# Patient Record
Sex: Male | Born: 1935
Health system: Southern US, Community
[De-identification: ages and names within clinical notes are randomized; demographics above are authoritative.]

## PROBLEM LIST (undated history)

## (undated) ENCOUNTER — Emergency Department (HOSPITAL_COMMUNITY): Payer: PPO

## (undated) DIAGNOSIS — N400 Enlarged prostate without lower urinary tract symptoms: Secondary | ICD-10-CM

## (undated) DIAGNOSIS — F0280 Dementia in other diseases classified elsewhere without behavioral disturbance: Secondary | ICD-10-CM

## (undated) DIAGNOSIS — R296 Repeated falls: Secondary | ICD-10-CM

## (undated) DIAGNOSIS — I2510 Atherosclerotic heart disease of native coronary artery without angina pectoris: Secondary | ICD-10-CM

## (undated) DIAGNOSIS — N1832 Chronic kidney disease, stage 3b: Secondary | ICD-10-CM

## (undated) DIAGNOSIS — I219 Acute myocardial infarction, unspecified: Secondary | ICD-10-CM

## (undated) DIAGNOSIS — T7840XA Allergy, unspecified, initial encounter: Secondary | ICD-10-CM

## (undated) DIAGNOSIS — D721 Eosinophilia, unspecified: Secondary | ICD-10-CM

## (undated) DIAGNOSIS — I10 Essential (primary) hypertension: Secondary | ICD-10-CM

## (undated) DIAGNOSIS — E7800 Pure hypercholesterolemia, unspecified: Secondary | ICD-10-CM

## (undated) DIAGNOSIS — G500 Trigeminal neuralgia: Secondary | ICD-10-CM

## (undated) DIAGNOSIS — E785 Hyperlipidemia, unspecified: Secondary | ICD-10-CM

## (undated) DIAGNOSIS — N179 Acute kidney failure, unspecified: Secondary | ICD-10-CM

## (undated) DIAGNOSIS — D62 Acute posthemorrhagic anemia: Secondary | ICD-10-CM

## (undated) DIAGNOSIS — R7303 Prediabetes: Secondary | ICD-10-CM

## (undated) DIAGNOSIS — J4540 Moderate persistent asthma, uncomplicated: Secondary | ICD-10-CM

## (undated) DIAGNOSIS — E119 Type 2 diabetes mellitus without complications: Secondary | ICD-10-CM

## (undated) DIAGNOSIS — T07XXXA Unspecified multiple injuries, initial encounter: Secondary | ICD-10-CM

## (undated) DIAGNOSIS — D696 Thrombocytopenia, unspecified: Secondary | ICD-10-CM

## (undated) HISTORY — PX: APPENDECTOMY: SHX54

## (undated) HISTORY — DX: Eosinophilia, unspecified: D72.10

## (undated) HISTORY — DX: Acute kidney failure, unspecified: N17.9

## (undated) HISTORY — DX: Moderate persistent asthma, uncomplicated: J45.40

## (undated) HISTORY — DX: Repeated falls: R29.6

## (undated) HISTORY — DX: Atherosclerotic heart disease of native coronary artery without angina pectoris: I25.10

## (undated) HISTORY — DX: Benign prostatic hyperplasia without lower urinary tract symptoms: N40.0

## (undated) HISTORY — DX: Essential (primary) hypertension: I10

## (undated) HISTORY — PX: EXPLORATORY LAPAROTOMY: SUR591

## (undated) HISTORY — DX: Hyperlipidemia, unspecified: E78.5

## (undated) HISTORY — DX: Unspecified multiple injuries, initial encounter: T07.XXXA

## (undated) HISTORY — DX: Prediabetes: R73.03

## (undated) HISTORY — DX: Acute posthemorrhagic anemia: D62

## (undated) HISTORY — DX: Thrombocytopenia, unspecified: D69.6

## (undated) HISTORY — DX: Allergy, unspecified, initial encounter: T78.40XA

## (undated) HISTORY — DX: Eosinophilia: D72.1

## (undated) HISTORY — DX: Chronic kidney disease, stage 3b: N18.32

## (undated) HISTORY — PX: CARDIAC CATHETERIZATION: SHX172

## (undated) HISTORY — DX: Acute myocardial infarction, unspecified: I21.9

## (undated) HISTORY — DX: Type 2 diabetes mellitus without complications: E11.9

---

## 2006-07-29 ENCOUNTER — Ambulatory Visit: Payer: Self-pay | Admitting: Cardiology

## 2006-07-29 IMAGING — CT CT ANGIO CHEST
2 of 5 series · 19 of 36 positions shown · IV contrast (APPLIED)
Comparison: Chest radiograph obtained earlier today.
COMPARISON: None.

CLINICAL DATA: Acute chest pain radiating to the back. Shortness of breath.

CT ANGIOGRAPHY OF CHEST - PULMONARY EMBOLISM PROTOCOL
TECHNIQUE: Multidetector CT imaging of the chest and abdomen was performed
according to the protocol for detection of aortic dissection during bolus
injection of intravenous contrast.  Coronal and sagittal plane CT angiographic
image reconstructions were also generated.
Contrast:  100 cc Omnipaque 300

[Series 5: dissection 2.0 st · axial · 0.68mm/px · z∈[-499,-51]mm · 16 of 248 slices shown]
[im 12/248  lung]
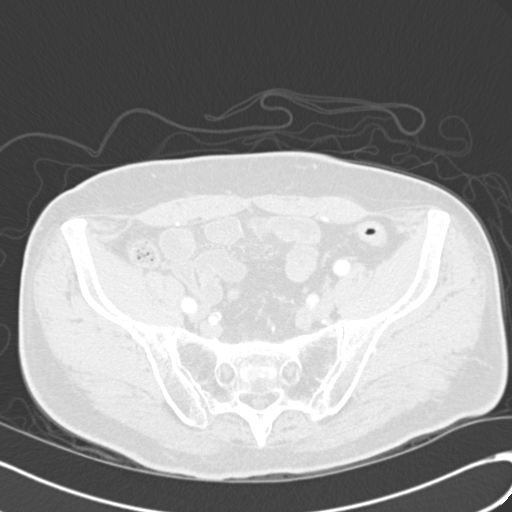
[im 24/248  mediastinal]
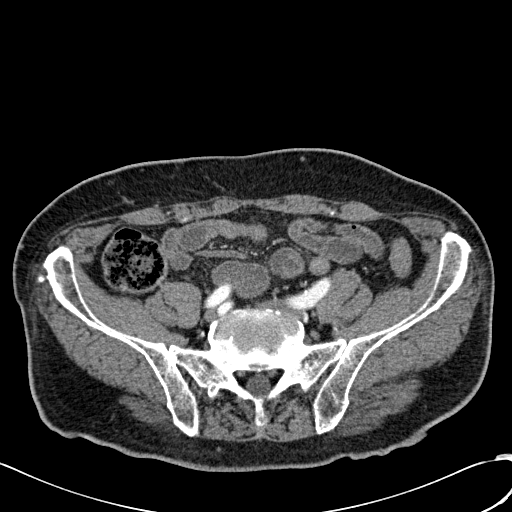
[im 48/248  lung]
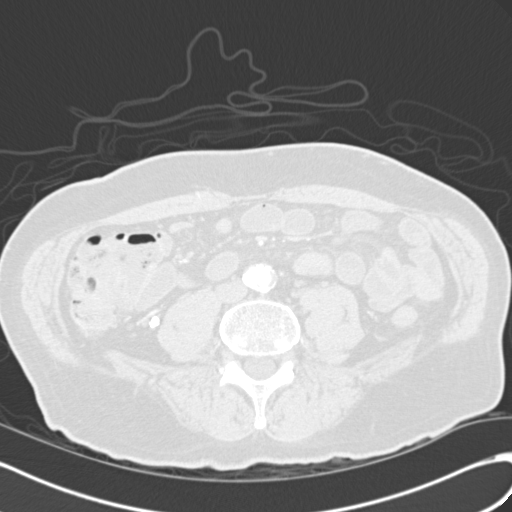
[im 59/248  mediastinal]
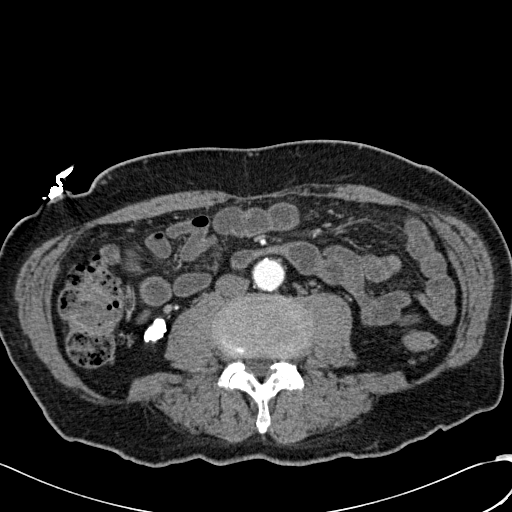
[im 71/248  lung]
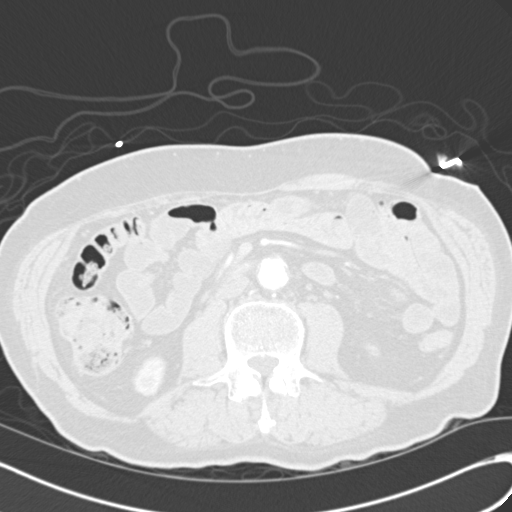
[im 83/248  mediastinal]
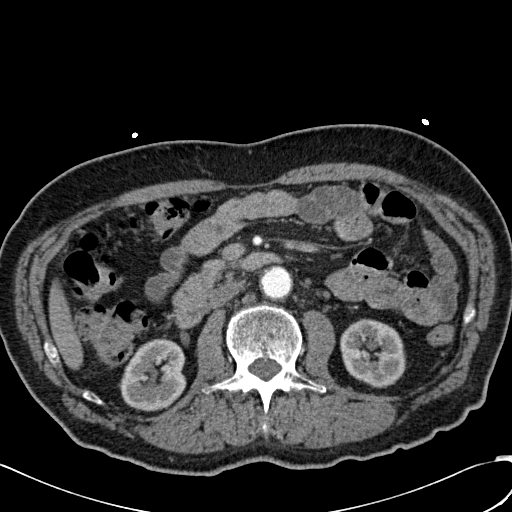
[im 106/248  lung]
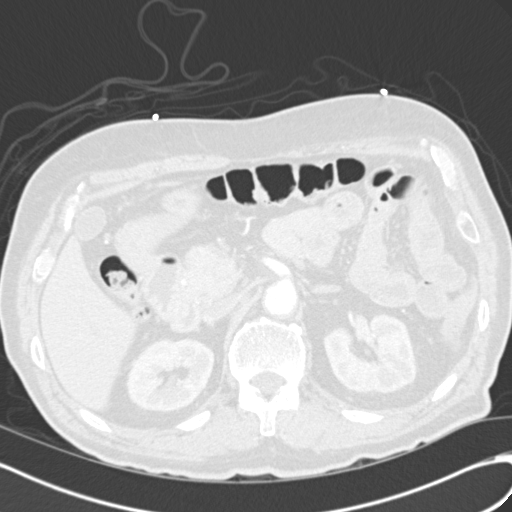
[im 118/248  mediastinal]
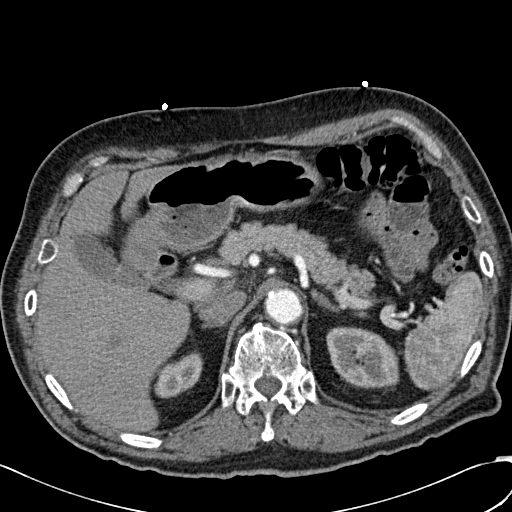
[im 130/248  lung]
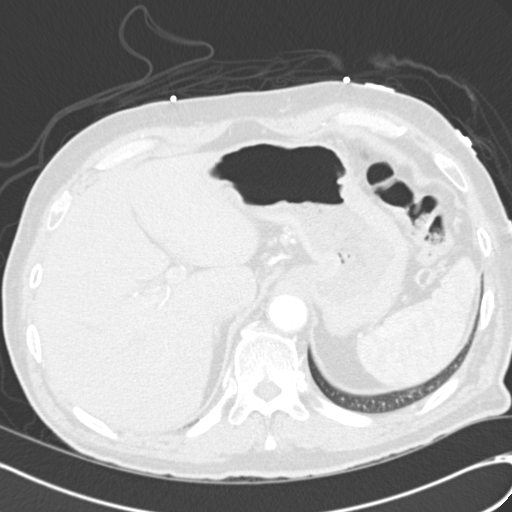
[im 142/248  mediastinal]
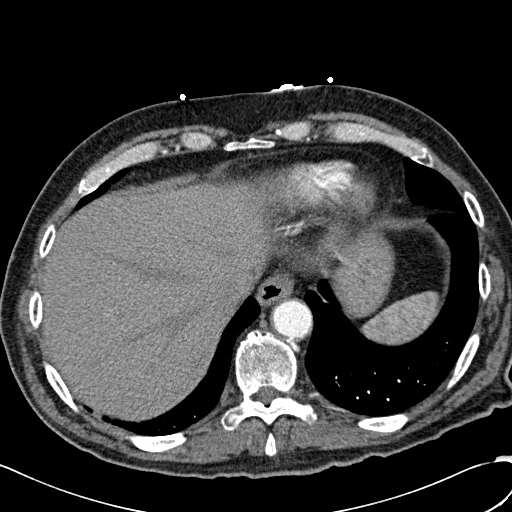
[im 165/248  lung]
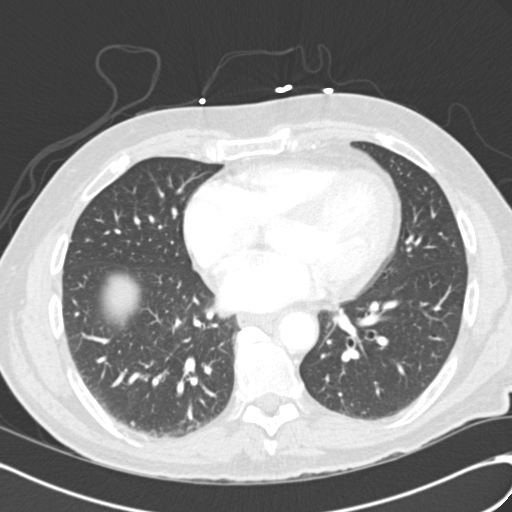
[im 177/248  mediastinal]
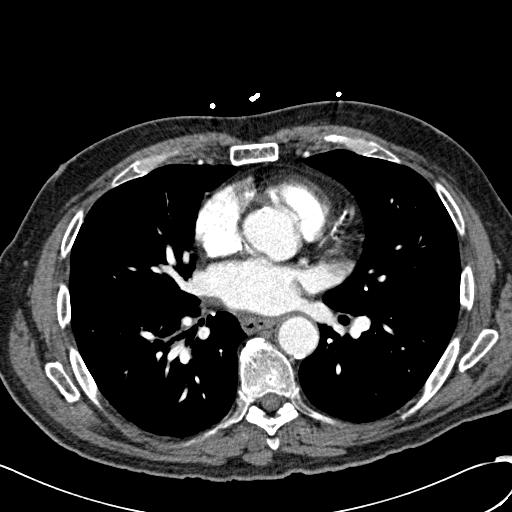
[im 189/248  lung]
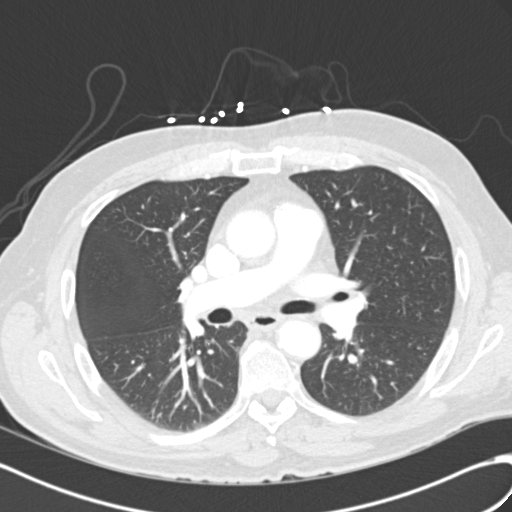
[im 200/248  mediastinal]
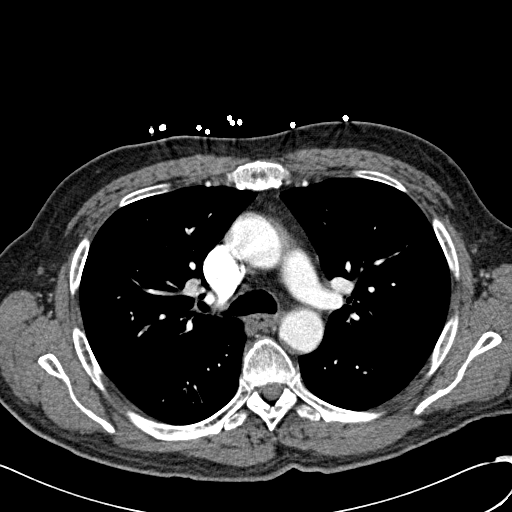
[im 224/248  lung]
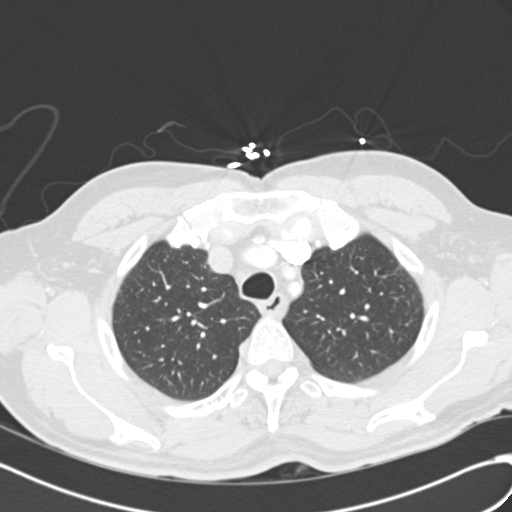
[im 236/248  mediastinal]
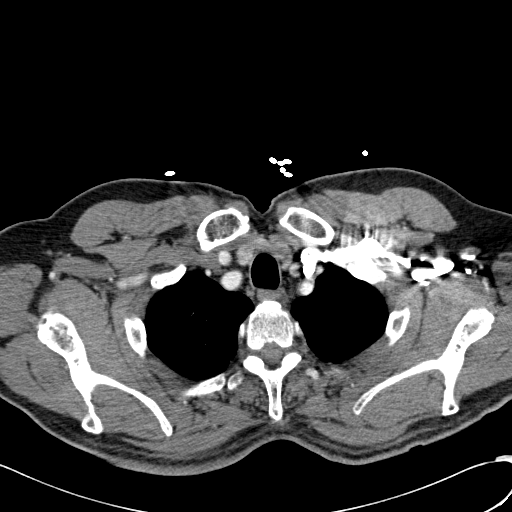

[Series 602: coronals · coronal · 0.97mm/px · 3 of 124 slices shown]
[im 25/124  mediastinal]
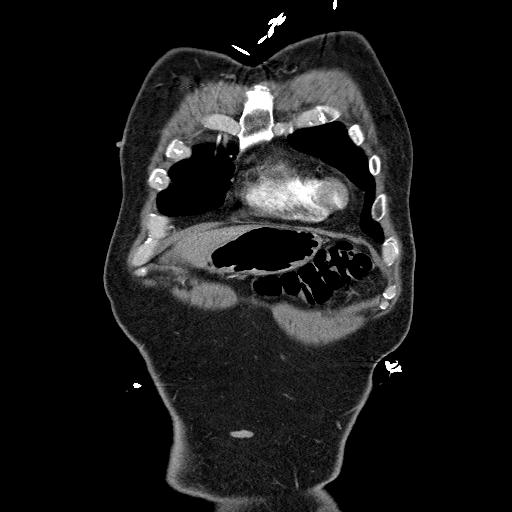
[im 50/124  mediastinal]
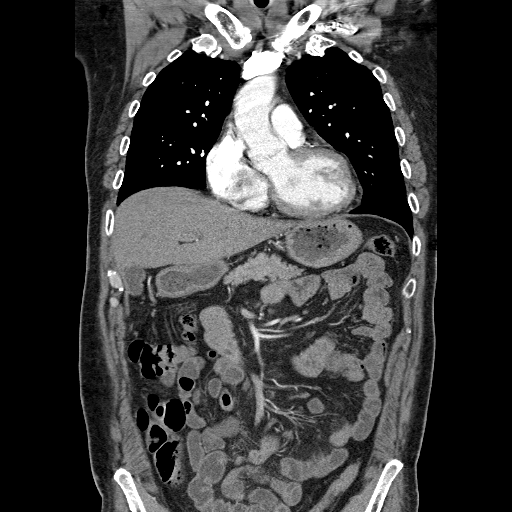
[im 74/124  mediastinal]
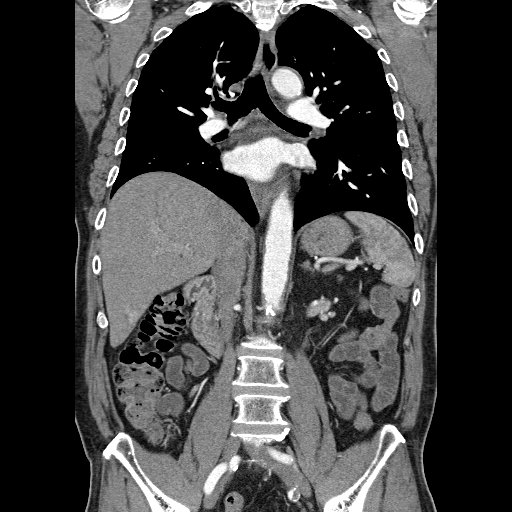

[19 of 36 positions shown; findings below may reference images not displayed]

FINDINGS: Normal sized thoracic aorta without aneurysm or dissection. Normally
opacified pulmonary arteries with no pulmonary arterial filling defects seen. No
pneumothorax or pleural fluid is seen. No lung masses or enlarged lymph nodes.
Thoracic spine degenerative changes.

IMPRESSION

No acute abnormality. 

CT ANGIOGRAPHY OF ABDOMEN
FINDINGS: Normal sized abdominal aorta without aneurysm or dissection. Calcified
mesenteric lymph nodes in the right lower abdomen. Unremarkable liver, spleen,
gallbladder, kidneys and adrenal glands. No gastrointestinal abnormalities.
Lumbar spine degenerative changes.

IMPRESSION

No acute abnormality.

## 2006-07-29 IMAGING — CR DG CHEST 1V PORT
1 series · 1 of 1 positions shown · non-contrast
Comparison: none

CLINICAL DATA: Chest pain. 
 PORTABLE CHEST:

[view not recorded]
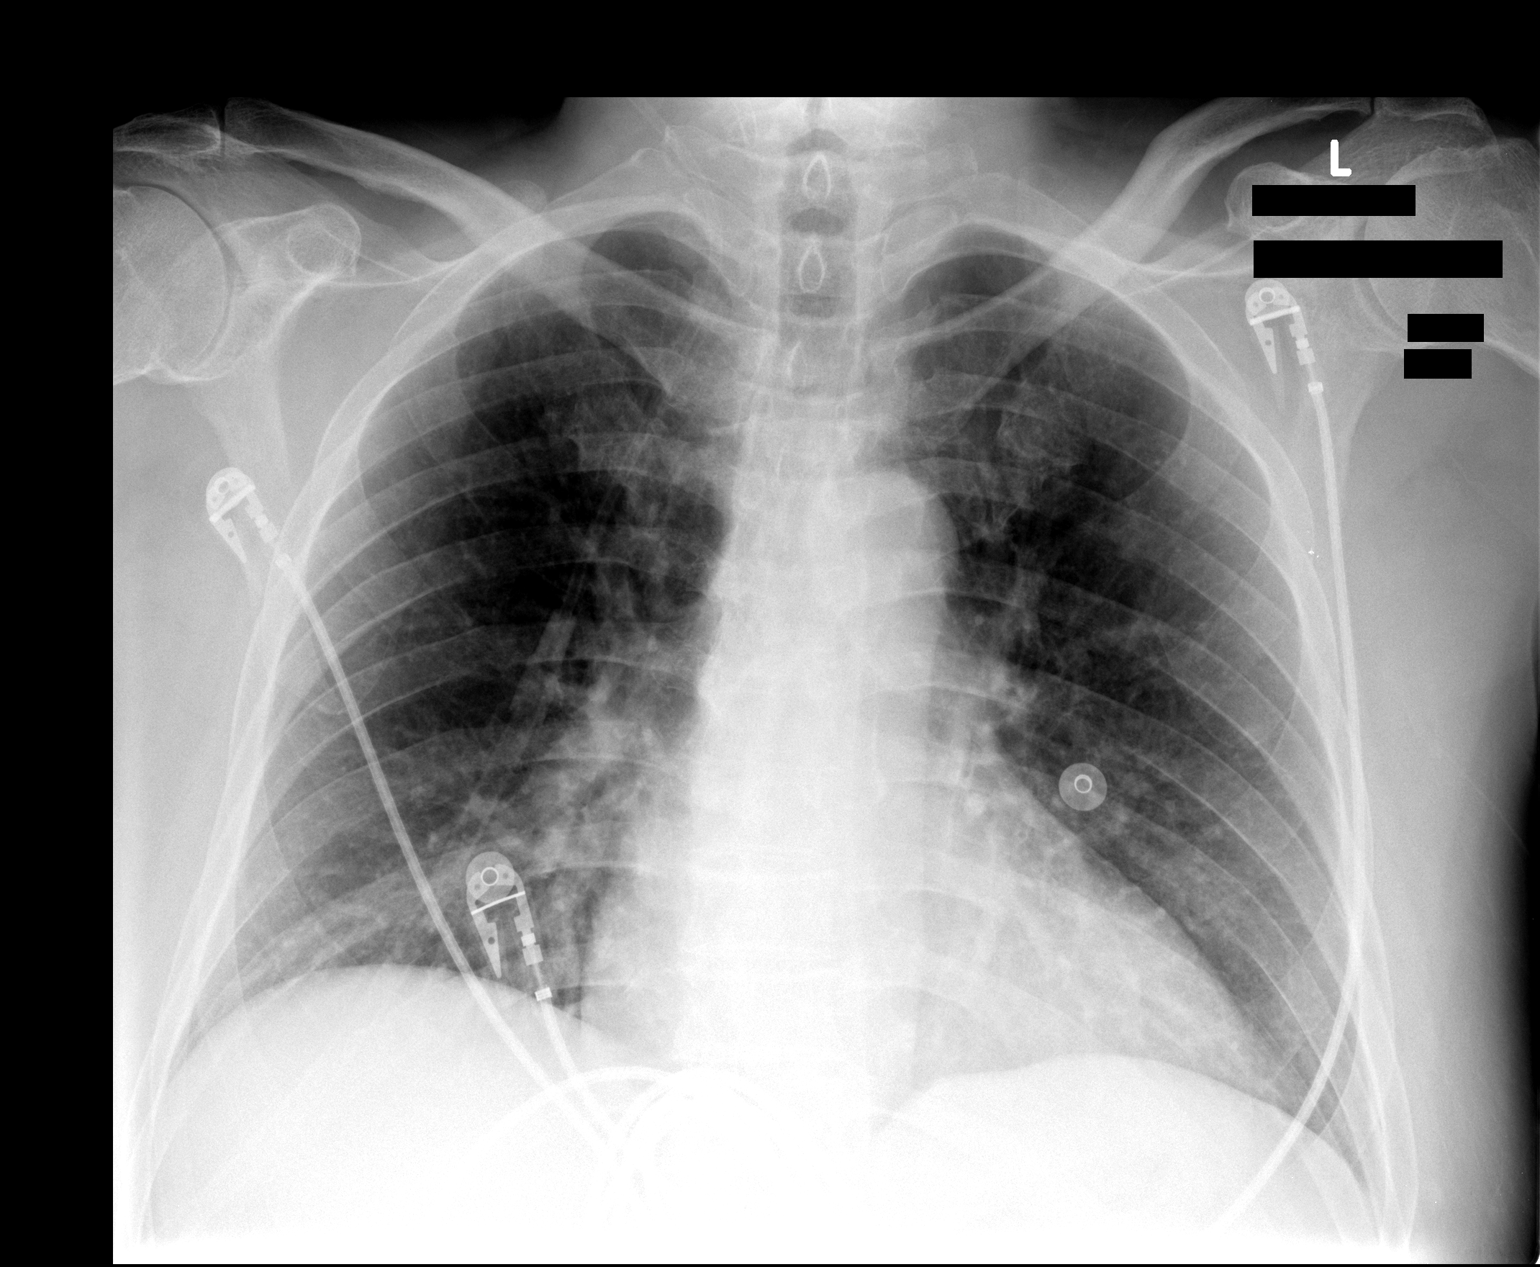

[1 of 1 positions shown; findings below may reference images not displayed]

FINDINGS: Vascular congestion with mild edema.  Heart upper normal.  There is no adenopathy.
IMPRESSION: Vascular congestion with mild edema.

## 2006-07-30 HISTORY — PX: CARDIAC CATHETERIZATION: SHX172

## 2006-07-30 IMAGING — CT CT PELVIS W/O CM
2 of 4 series · 12 of 36 positions shown, 19 images · non-contrast
Comparison: CTA abdomen [DATE]

ABDOMEN CT WITHOUT CONTRAST:

CLINICAL DATA: Abdominal pain, back pain status post catheterization. Assess for
retroperitoneal bleed
TECHNIQUE: Multidetector CT imaging of the abdomen and pelvis was performed
following the standard protocol without oral or intravenous contrast.

[Series 2: routine abdomen · axial · 0.82mm/px · z∈[-492,-12]mm · 11 of 116 slices shown, 17 images]
[im 10/116  soft-tissue]
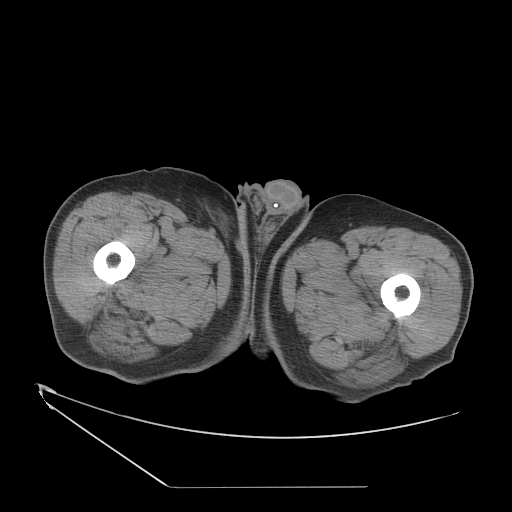
[im 10/116  bone]
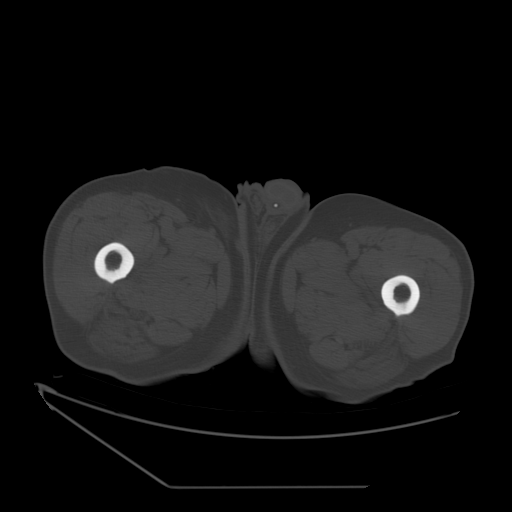
[im 20/116  soft-tissue]
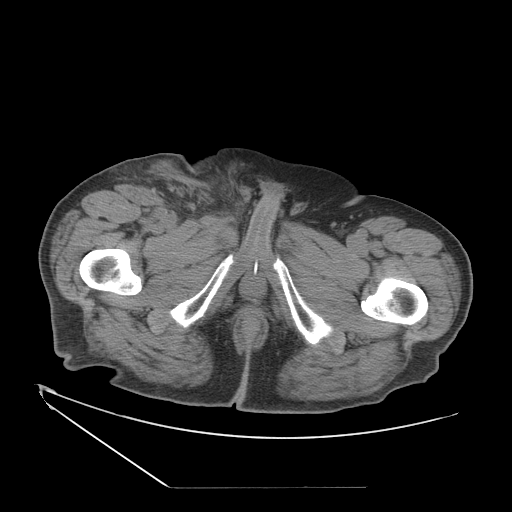
[im 29/116  soft-tissue]
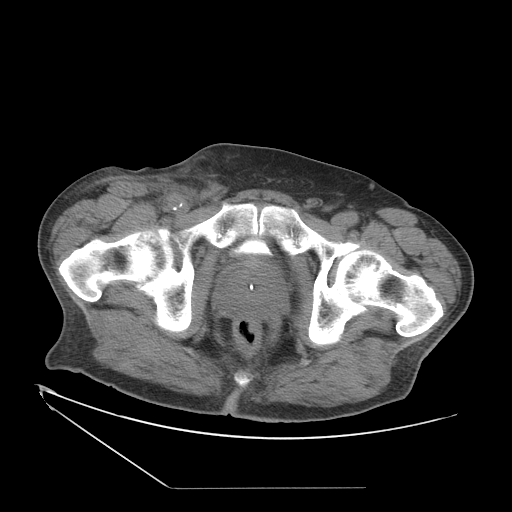
[im 39/116  soft-tissue]
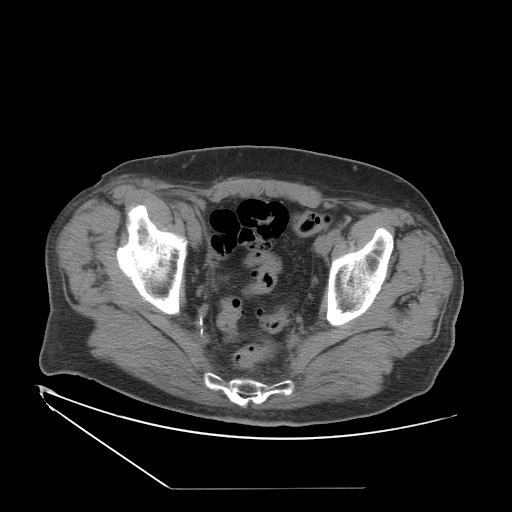
[im 48/116  soft-tissue]
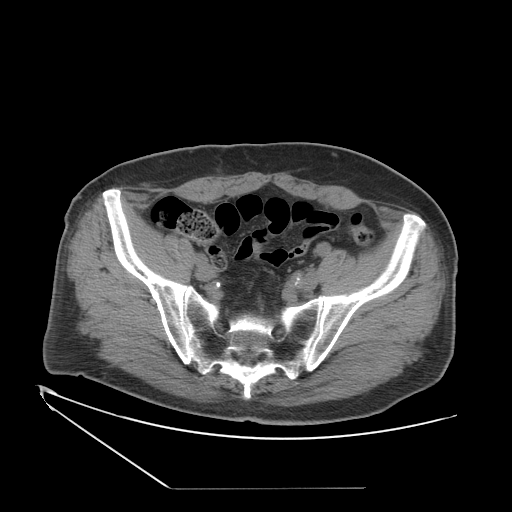
[im 58/116  soft-tissue]
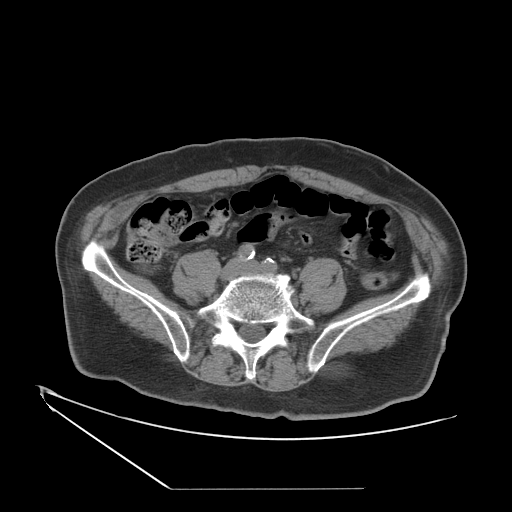
[im 68/116  soft-tissue]
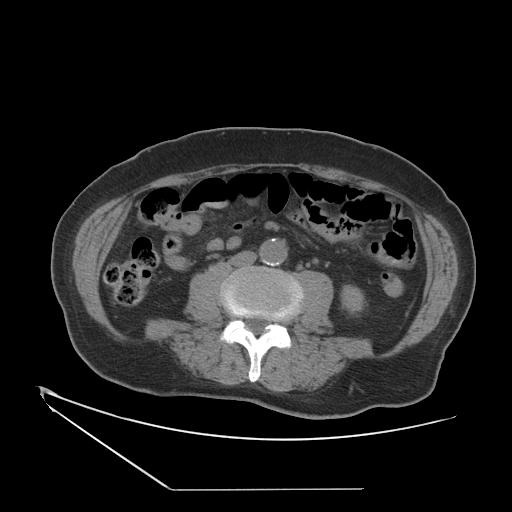
[im 77/116  soft-tissue]
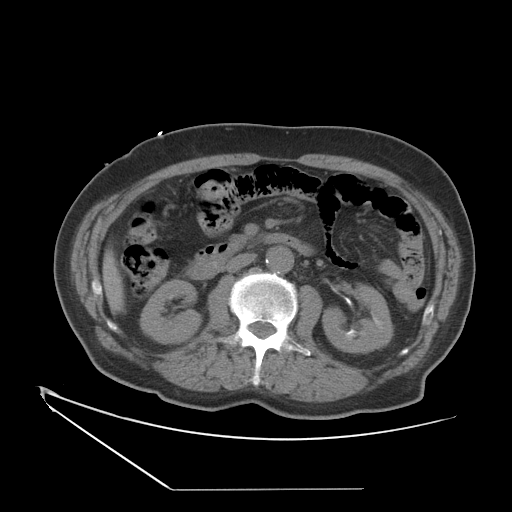
[im 77/116  lung]
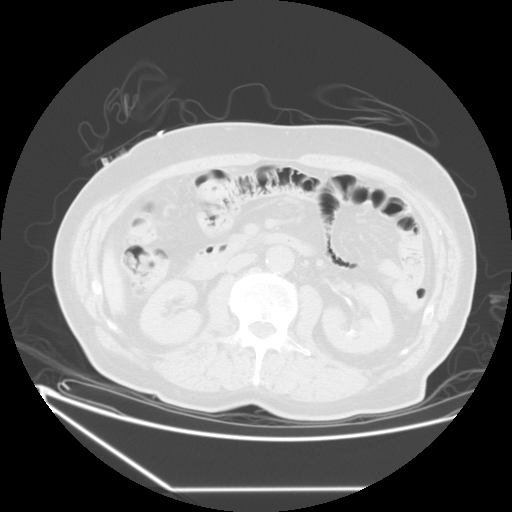
[im 87/116  soft-tissue]
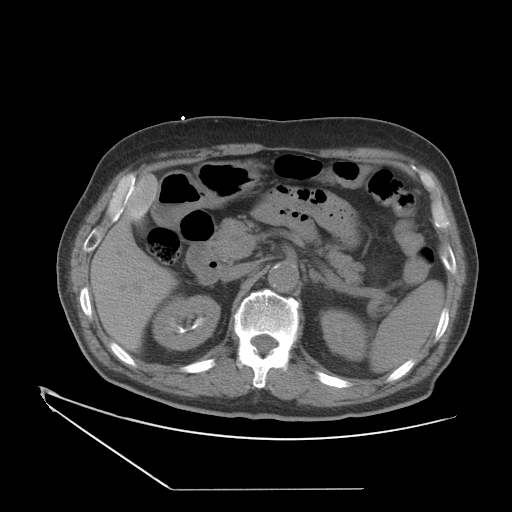
[im 87/116  lung]
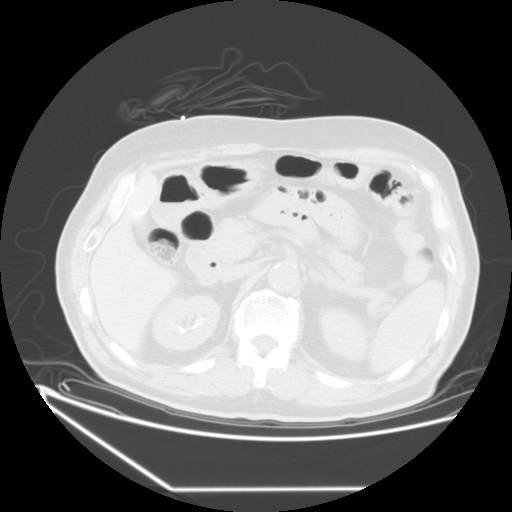
[im 87/116  bone]
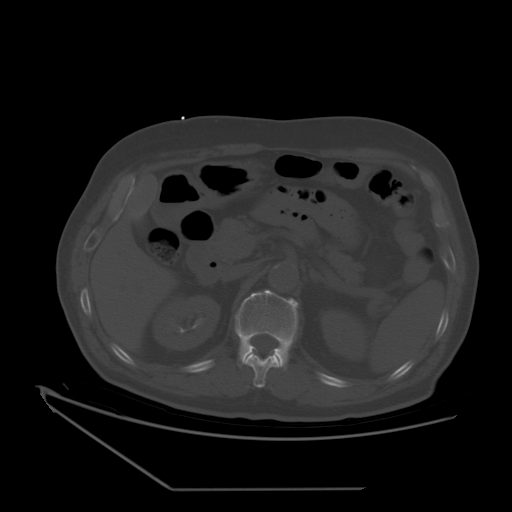
[im 96/116  soft-tissue]
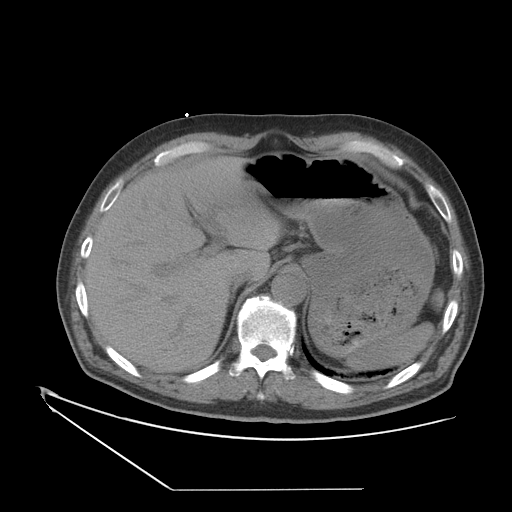
[im 96/116  lung]
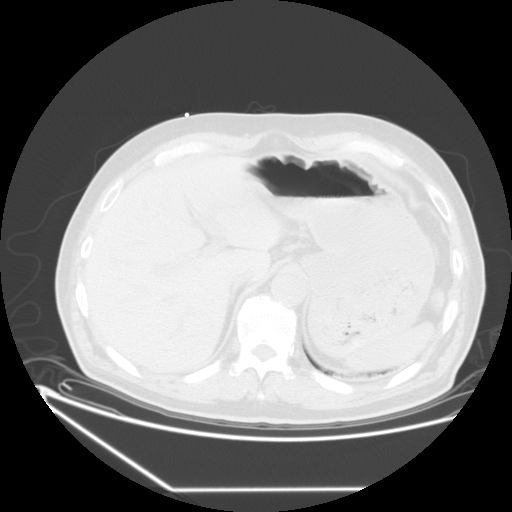
[im 106/116  soft-tissue]
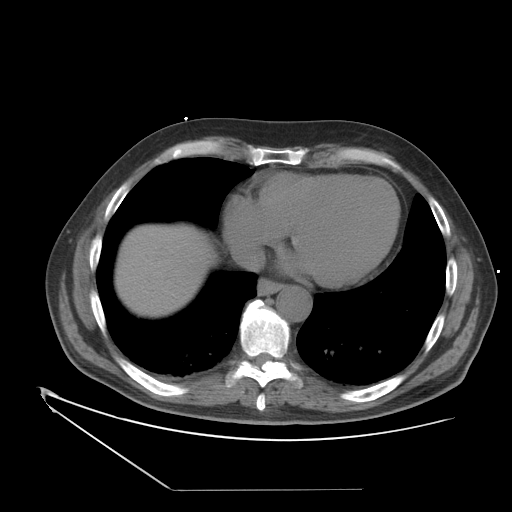
[im 106/116  lung]
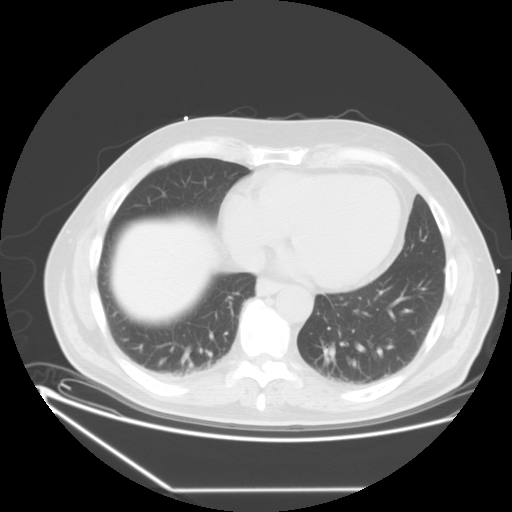

[Series 400: reformatted · sagittal · 1.15mm/px · 1 of 187 slices shown, 2 images]
[im 63/187  soft-tissue]
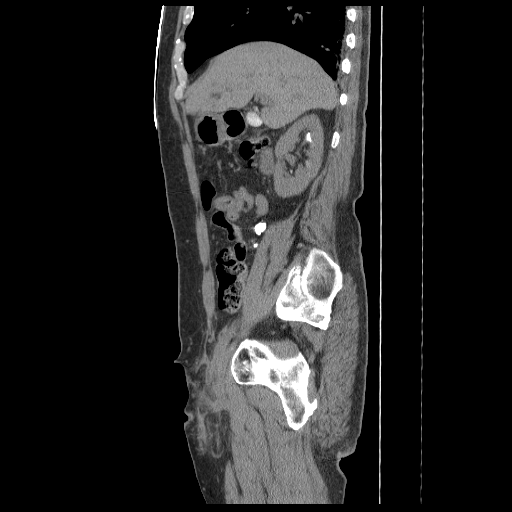
[im 63/187  bone]
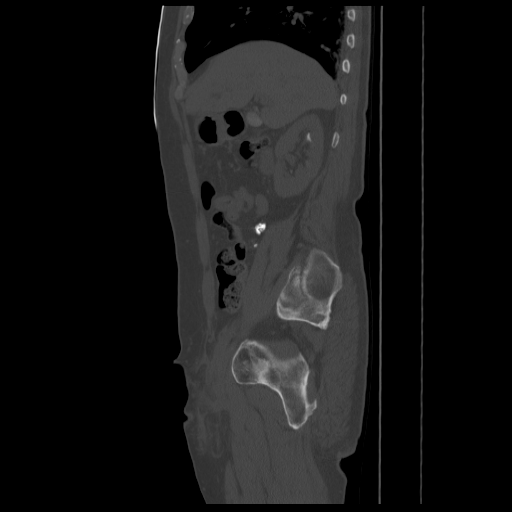

[12 of 36 positions shown; findings below may reference images not displayed]

FINDINGS: Contrast is seen within the gallbladder compatible with vicarious
excretion. Minimal bibasilar atelectasis present. Solid organs have an
unremarkable unenhanced appearance. No evidence of retroperitoneal hematoma.
Again there are calcified right lower quadrant mesenteric lymph nodes. Bowel
grossly unremarkable.
IMPRESSION: No evidence of retroperitoneal hematoma. No acute findings in the abdomen.

PELVIS CT WITHOUT CONTRAST:
FINDINGS: Moderate size right groin hematoma noted. No evidence of extra
peritoneal or retroperitoneal extension. Foley catheter is present in the
bladder. There is prostate enlargement. No free fluid or free air, or
adenopathy. There are diverticula within the sigmoid colon without active
diverticulitis changes.
IMPRESSION: Moderate sized right groin hematoma without retroperitoneal extension.

Prostate enlargement.

Sigmoid diverticulosis.

## 2006-07-31 ENCOUNTER — Inpatient Hospital Stay (HOSPITAL_COMMUNITY): Admission: EM | Admit: 2006-07-31 | Discharge: 2006-07-31 | Payer: PPO | Admitting: Emergency Medicine

## 2006-08-22 ENCOUNTER — Encounter (HOSPITAL_COMMUNITY): Admission: RE | Admit: 2006-08-22 | Discharge: 2006-11-20 | Payer: Self-pay | Admitting: Cardiology

## 2006-08-23 ENCOUNTER — Ambulatory Visit: Payer: Self-pay | Admitting: Cardiology

## 2006-09-24 ENCOUNTER — Ambulatory Visit: Payer: Self-pay | Admitting: Cardiology

## 2006-09-24 LAB — CONVERTED CEMR LAB
ALT: 17 units/L (ref 0–53)
AST: 24 units/L (ref 0–37)
Albumin: 3.9 g/dL (ref 3.5–5.2)
Alkaline Phosphatase: 45 units/L (ref 39–117)
Bilirubin, Direct: 0.1 mg/dL (ref 0.0–0.3)
Cholesterol: 118 mg/dL (ref 0–200)
HDL: 45.3 mg/dL (ref >39.0)
LDL Cholesterol: 61 mg/dL (ref 0–99)
Total Bilirubin: 0.8 mg/dL (ref 0.3–1.2)
Total CHOL/HDL Ratio: 2.6
Total Protein: 6.5 g/dL (ref 6.0–8.3)
Triglycerides: 59 mg/dL (ref 0–149)
VLDL: 12 mg/dL (ref 0–40)

## 2006-10-17 ENCOUNTER — Ambulatory Visit: Payer: Self-pay | Admitting: Cardiology

## 2007-02-14 IMAGING — CR DG CHEST 1V PORT
1 series · 1 of 1 positions shown · non-contrast
Comparison: chest x-ray [DATE] and CTA chest [DATE]

CLINICAL DATA: Dizziness

PORTABLE CHEST - 1 VIEW

[AP]
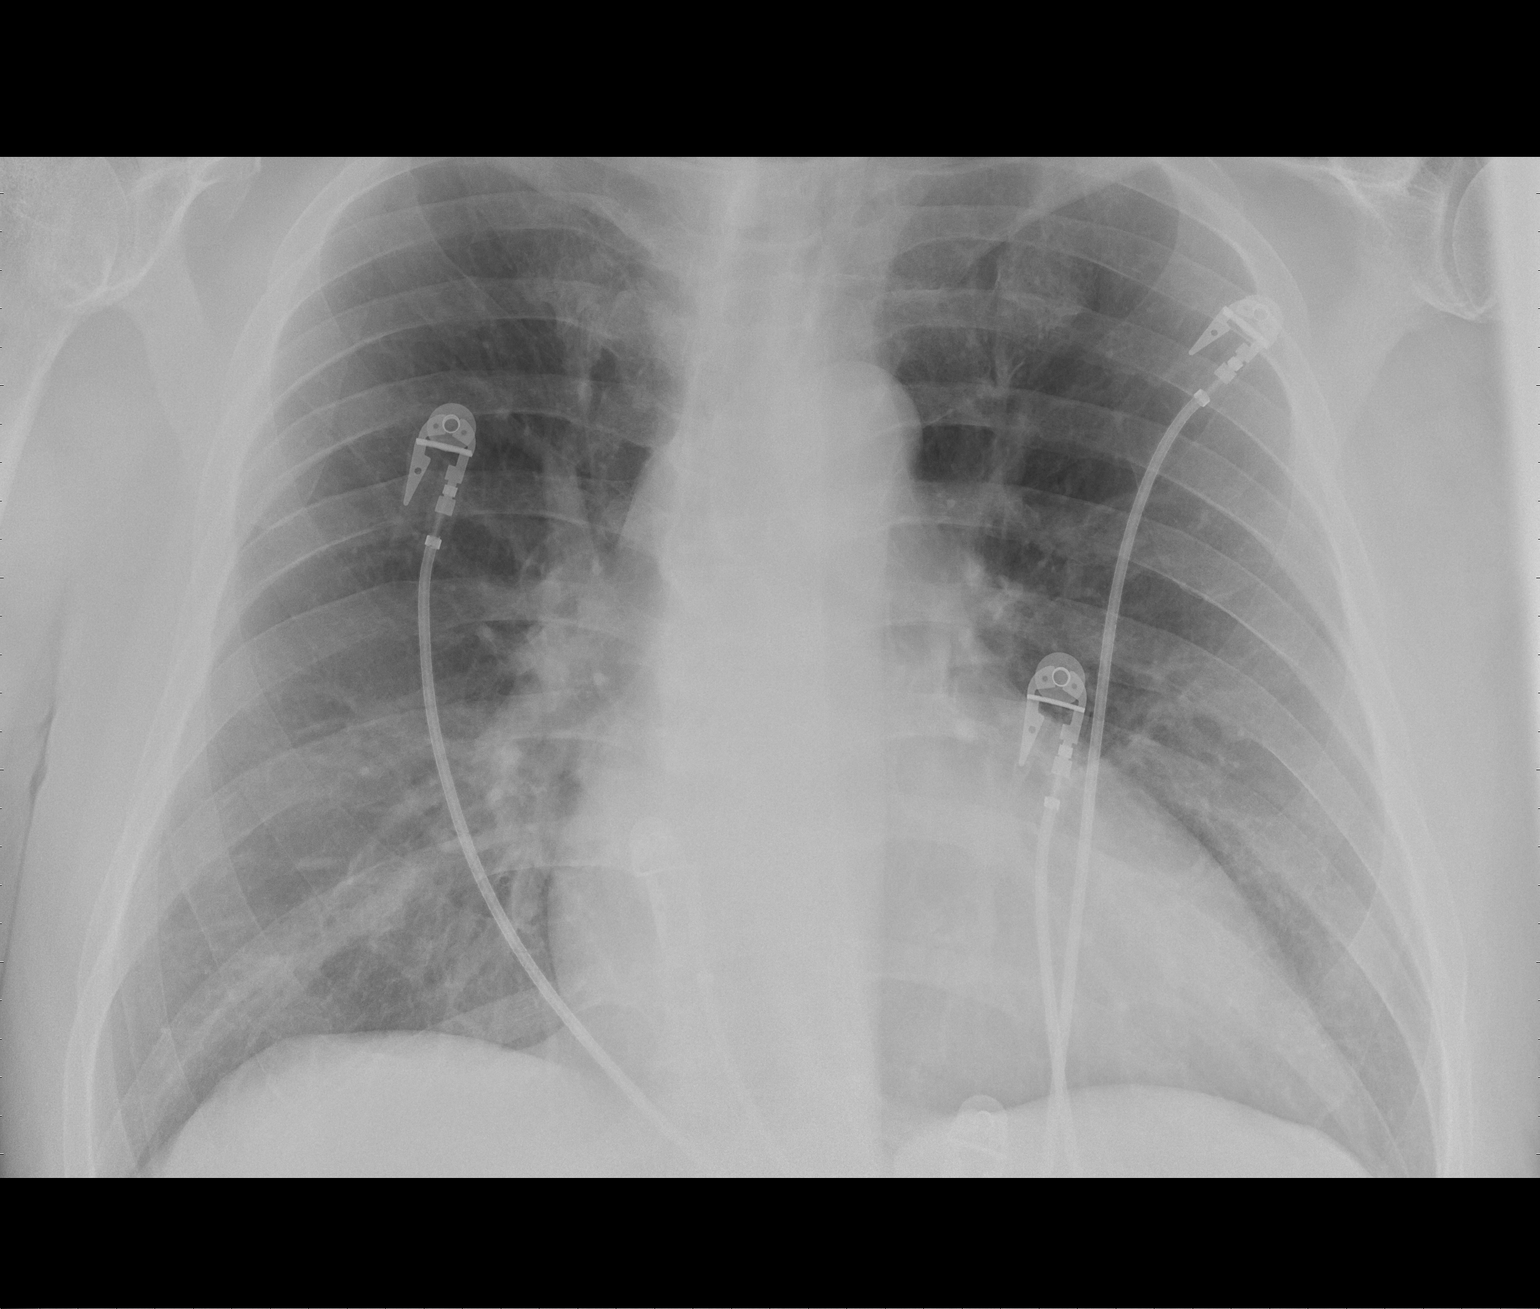

[1 of 1 positions shown; findings below may reference images not displayed]

FINDINGS: Cardiac silhouette is stable and appears mildly prominent
for portable technique.  Pulmonary vascularity is within normal
limits.  No focal airspace opacity, effusion, edema or pneumothorax
is identified.
IMPRESSION: Stable cardiac enlargement.  No evidence of acute cardiopulmonary
disease.

## 2007-08-04 ENCOUNTER — Ambulatory Visit: Payer: Self-pay | Admitting: Cardiology

## 2007-08-11 ENCOUNTER — Ambulatory Visit: Payer: Self-pay

## 2007-08-11 LAB — CONVERTED CEMR LAB
ALT: 15 units/L (ref 0–53)
AST: 26 units/L (ref 0–37)
Albumin: 3.8 g/dL (ref 3.5–5.2)
Alkaline Phosphatase: 59 units/L (ref 39–117)
BUN: 20 mg/dL (ref 6–23)
Bilirubin, Direct: 0.1 mg/dL (ref 0.0–0.3)
CO2: 28 meq/L (ref 19–32)
Calcium: 9.2 mg/dL (ref 8.4–10.5)
Chloride: 109 meq/L (ref 96–112)
Cholesterol: 150 mg/dL (ref 0–200)
Creatinine, Ser: 1 mg/dL (ref 0.4–1.5)
GFR calc Af Amer: 94 mL/min
GFR calc non Af Amer: 78 mL/min
Glucose, Bld: 93 mg/dL (ref 70–99)
HDL: 49.8 mg/dL (ref >39.0)
LDL Cholesterol: 87 mg/dL (ref 0–99)
Potassium: 4.6 meq/L (ref 3.5–5.1)
Sodium: 143 meq/L (ref 135–145)
Total Bilirubin: 0.7 mg/dL (ref 0.3–1.2)
Total CHOL/HDL Ratio: 3
Total Protein: 6.5 g/dL (ref 6.0–8.3)
Triglycerides: 66 mg/dL (ref 0–149)
VLDL: 13 mg/dL (ref 0–40)

## 2007-08-31 ENCOUNTER — Emergency Department (HOSPITAL_COMMUNITY): Admission: EM | Admit: 2007-08-31 | Discharge: 2007-08-31 | Payer: Self-pay | Admitting: Emergency Medicine

## 2007-08-31 IMAGING — CT CT HEAD W/O CM
1 of 2 series · 13 of 30 positions shown, 17 images · non-contrast
Comparison: None available

CLINICAL DATA: Dizziness.

CT HEAD WITHOUT CONTRAST
TECHNIQUE: Contiguous axial images were obtained from the base of
the skull through the vertex without contrast.

[Series 2: brain · axial · 0.47mm/px · z∈[+145,+279]mm · 13 of 32 slices shown, 17 images]
[im 3/32  brain]
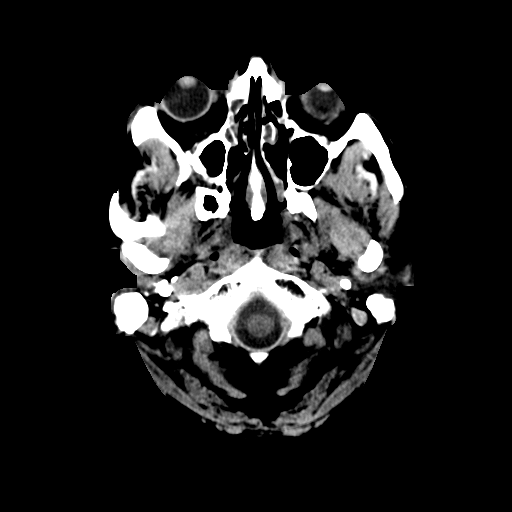
[im 3/32  bone]
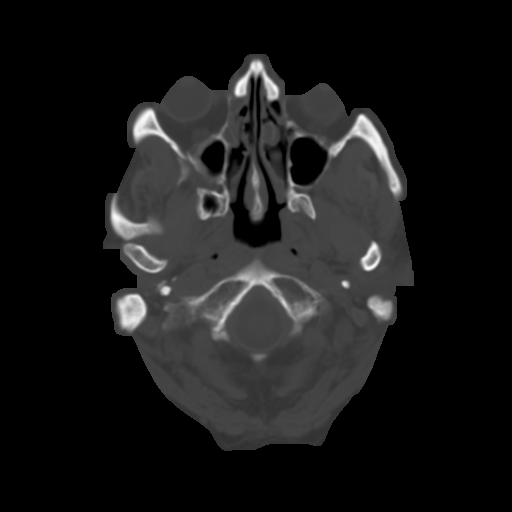
[im 5/32  brain]
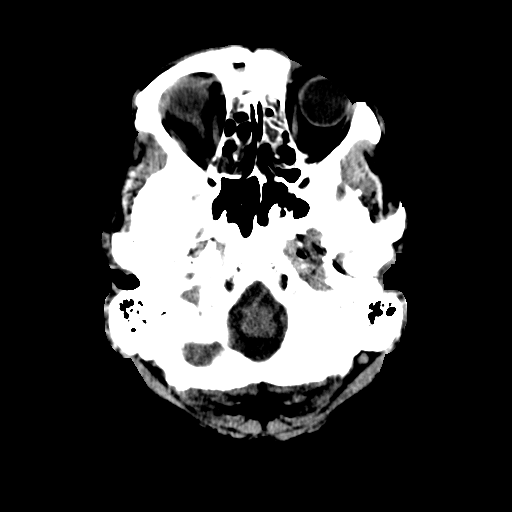
[im 7/32  brain]
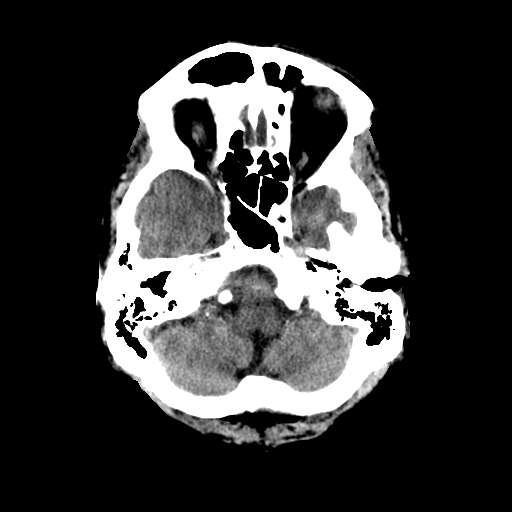
[im 9/32  brain]
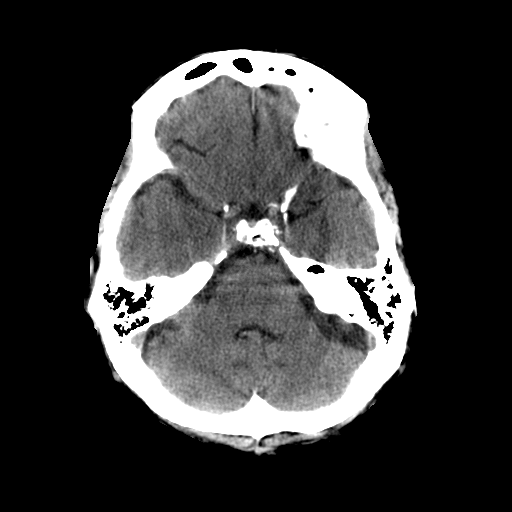
[im 12/32  brain]
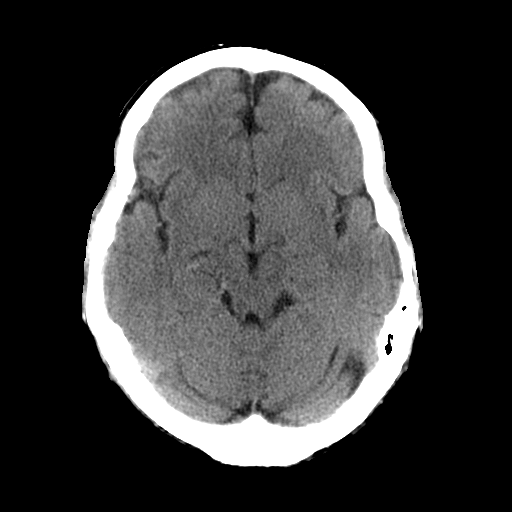
[im 12/32  bone]
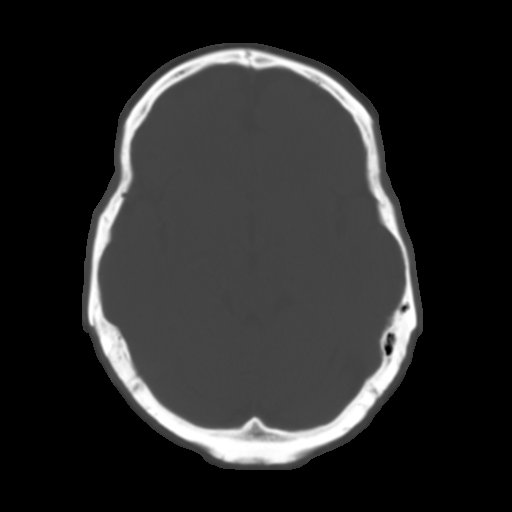
[im 14/32  brain]
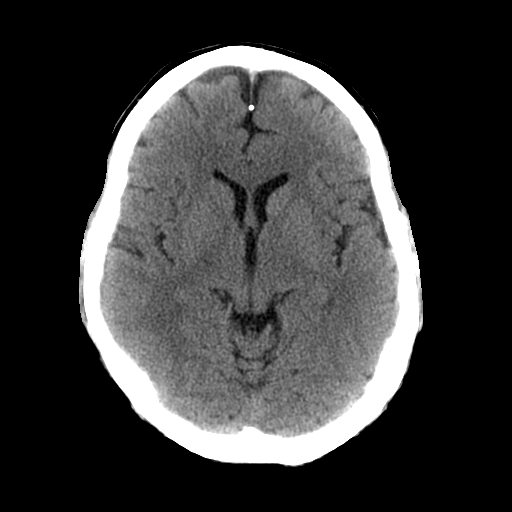
[im 16/32  brain]
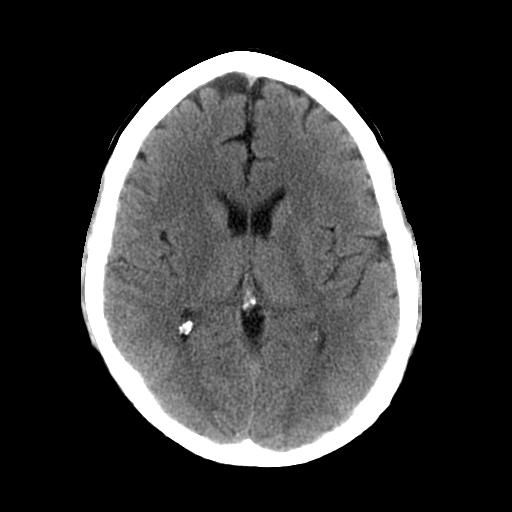
[im 18/32  brain]
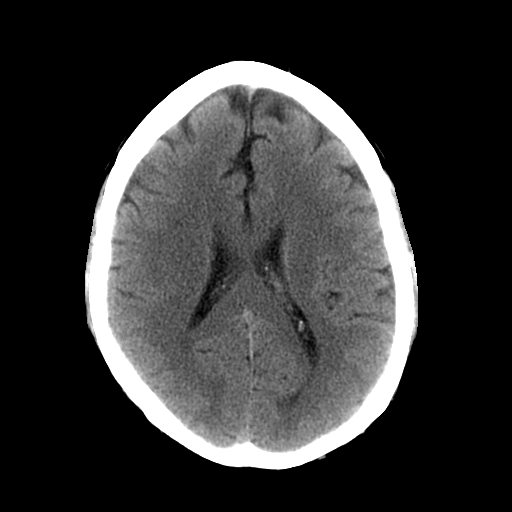
[im 20/32  brain]
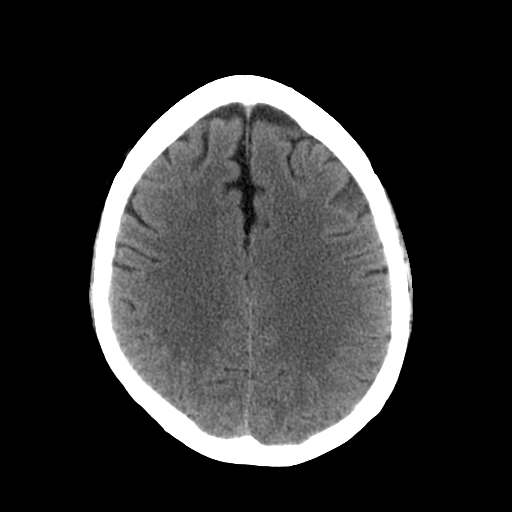
[im 20/32  bone]
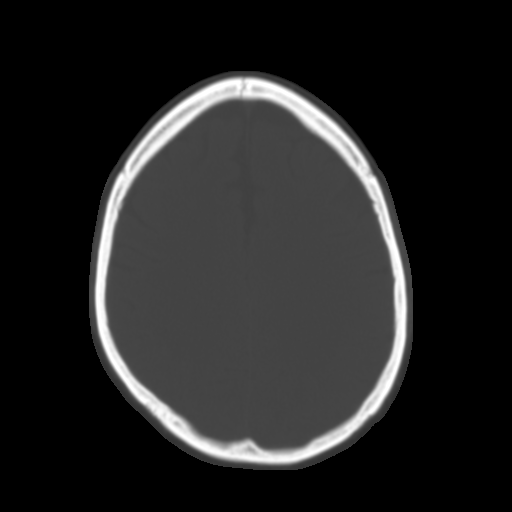
[im 23/32  brain]
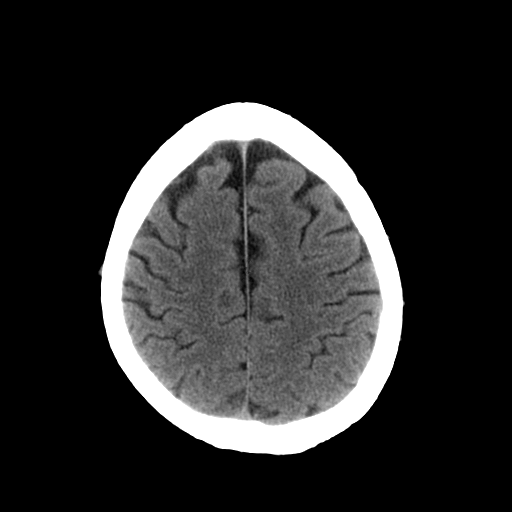
[im 25/32  brain]
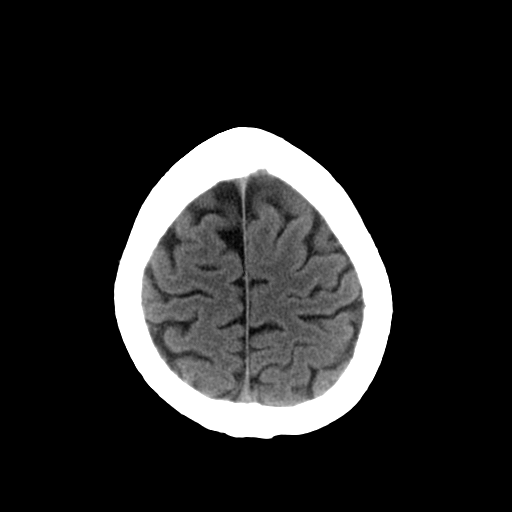
[im 27/32  brain]
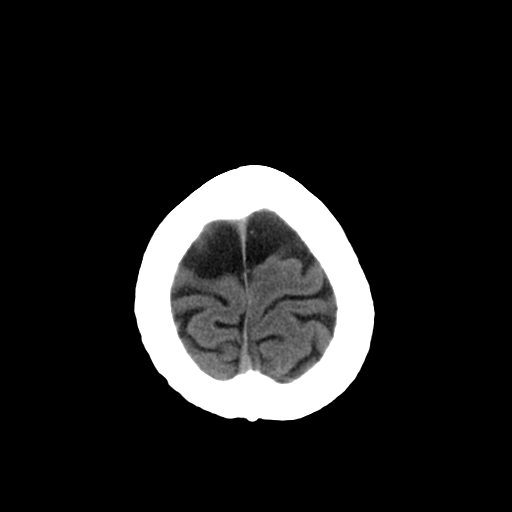
[im 29/32  brain]
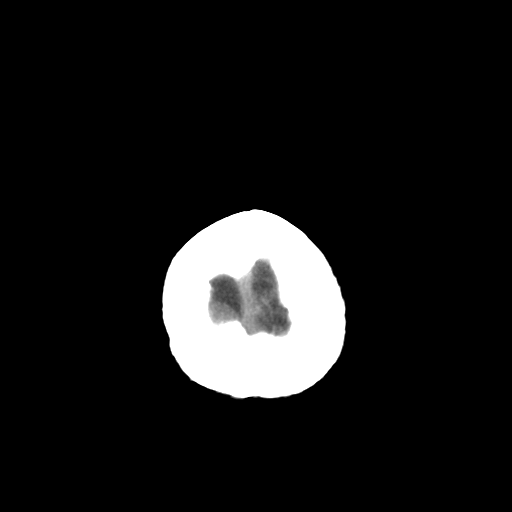
[im 29/32  bone]
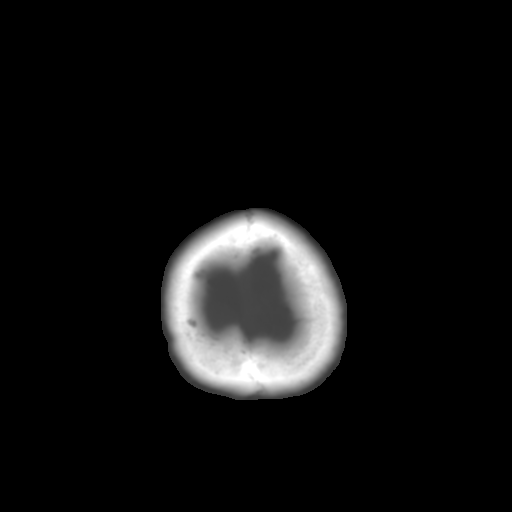

[13 of 30 positions shown; findings below may reference images not displayed]

FINDINGS: There is no mass lesion, mass effect, midline shift,
hydrocephalus, or hemorrhage.  No territorial ischemia or infarct.
Gray white differentiation is preserved.  Mucosal thickening is
present in the right maxillary sinus, and there is moderate
opacification of a number of anterior ethmoid air cells, which
could be seen in sinusitis.  The mastoid air cells are clear.
Middle ears appear clear bilaterally.
IMPRESSION: 1.  No acute intracranial abnormality.
2.  Opacification of most of the ethmoid air cells, question
sinusitis.

## 2008-03-08 DIAGNOSIS — I2510 Atherosclerotic heart disease of native coronary artery without angina pectoris: Secondary | ICD-10-CM

## 2008-03-08 DIAGNOSIS — I10 Essential (primary) hypertension: Secondary | ICD-10-CM

## 2008-03-08 DIAGNOSIS — E782 Mixed hyperlipidemia: Secondary | ICD-10-CM | POA: Insufficient documentation

## 2008-03-08 DIAGNOSIS — I951 Orthostatic hypotension: Secondary | ICD-10-CM | POA: Insufficient documentation

## 2008-03-08 HISTORY — DX: Atherosclerotic heart disease of native coronary artery without angina pectoris: I25.10

## 2008-03-08 HISTORY — DX: Mixed hyperlipidemia: E78.2

## 2008-03-08 HISTORY — DX: Orthostatic hypotension: I95.1

## 2008-07-03 ENCOUNTER — Encounter: Payer: Self-pay | Admitting: Cardiology

## 2008-07-14 ENCOUNTER — Ambulatory Visit: Payer: Self-pay | Admitting: Cardiology

## 2009-03-01 ENCOUNTER — Encounter: Payer: Self-pay | Admitting: Cardiology

## 2009-07-07 ENCOUNTER — Ambulatory Visit: Payer: Self-pay | Admitting: Cardiology

## 2009-07-14 ENCOUNTER — Telehealth: Payer: Self-pay | Admitting: Cardiology

## 2010-03-08 NOTE — Assessment & Plan Note (Signed)
Summary: [facility]  Medications Added FISH OIL   OIL (FISH OIL) daily      Allergies Added: NKDA  Visit Type:  Follow-up   History of Present Illness: [name] returns today for followup. He's been remarkably well remains extremely active. He is having no angina or symptoms of ischemia.his last stress Myoview was July 2009 which showed excellent exercise tolerance, ejection fraction 56%, no ischemia. There was no evidence of infarction as well.  Current Medications (verified): 1)  Aspirin Ec 325 Mg Tbec (Aspirin) .... Take One Tablet By Mouth Daily 2)  Zocor 40 Mg Tabs (Simvastatin) .Marland Kitchen.. 1 Tab At Bedtime 3)  Plavix 75 Mg Tabs (Clopidogrel Bisulfate) .Marland Kitchen.. 1 Tab Once Daily 4)  Amlodipine Besy-Benazepril Hcl 5-20 Mg Caps (Amlodipine Besy-Benazepril Hcl) .Marland Kitchen.. 1 Tab Once Daily 5)  Nitroglycerin 0.4 Mg Subl (Nitroglycerin) .... One Tablet Under Tongue Every 5 Minutes As Needed For Chest Pain---May Repeat Times Three 6)  Co Q-10 300 Mg Caps (Coenzyme Q10) .Marland Kitchen.. 1 Cap Once Daily 7)  Saw Palmetto 500 Mg Caps (Saw Palmetto (Serenoa Repens)) .... 3 Caps Once Daily 8)  Multivitamins   Tabs (Multiple Vitamin) .Marland Kitchen.. 1 Tab Once Daily 9)  Calcium 600/vitamin D 600-400 Mg-Unit Tabs (Calcium Carbonate-Vitamin D) .Marland Kitchen.. 1 Tab Once Daily 10)  Fish Oil   Oil (Fish Oil) .... Daily  Allergies (verified): No Known Drug Allergies  Past History:  Past Medical History: Last updated: 03/08/2008 CAD Hyperlipidemia Hypertension  Family History: Last updated: 03/08/2008 Family History of Coronary Artery Disease:   Past Surgical History: Reviewed history from 03/08/2008 and no changes required. Exploratory Lap at age 10 Appendectomy  Review of Systems       negative other than history of present illness  Vital Signs:  Patient profile:   77 year old male Height:      67 inches Weight:      190 pounds BMI:     29.87 Pulse rate:   54 / minute Pulse rhythm:   irregular Resp:     18 per minute  BP sitting:   150 / 78  (left arm) Cuff size:   large  Vitals Entered By: Vikki Ports (July 14, 2008 10:39 AM)  Physical Exam  General:  Well developed, well nourished, in no acute distress. Head:  normocephalic and atraumatic Eyes:  PERRLA/EOM intact; conjunctiva and lids normal. Mouth:  Teeth, gums and palate normal. Oral mucosa normal. Neck:  Neck supple, no JVD. No masses, thyromegaly or abnormal cervical nodes. Chest Delissa Silba:  no deformities or breast masses noted Lungs:  Clear bilaterally to auscultation and percussion. Heart:  Non-displaced PMI, chest non-tender; regular rate and rhythm, S1, S2 without murmurs, rubs or gallops. Carotid upstroke normal, no bruit. Normal abdominal aortic size, no bruits. Femorals normal pulses, no bruits. Pedals normal pulses. No edema, no varicosities. Abdomen:  Bowel sounds positive; abdomen soft and non-tender without masses, organomegaly, or hernias noted. No hepatosplenomegaly. Msk:  Back normal, normal gait. Muscle strength and tone normal. Pulses:  pulses normal in all 4 extremities Extremities:  No clubbing or cyanosis. Neurologic:  Alert and oriented x 3. Skin:  Intact without lesions or rashes. Psych:  Normal affect.   EKG  Procedure date:  07/14/2008  Findings:      sinus bradycardia, left axis deviation, stable  Impression & Recommendations:  Problem # 1:  CAD, NATIVE VESSEL (ICD-414.01) Assessment Unchanged  His updated medication list for this problem includes:    Aspirin Ec 325 Mg  Tbec (Aspirin) .Marland Kitchen... Take one tablet by mouth daily    Plavix 75 Mg Tabs (Clopidogrel bisulfate) .Marland Kitchen... 1 tab once daily    Amlodipine Besy-benazepril Hcl 5-20 Mg Caps (Amlodipine besy-benazepril hcl) .Marland Kitchen... 1 tab once daily    Nitroglycerin 0.4 Mg Subl (Nitroglycerin) ..... One tablet under tongue every 5 minutes as needed for chest pain---may repeat times three  Patient Instructions: 1)  Your physician wants you to follow-up in: 12 MONTHS.    You will receive a reminder letter in the mail two months in advance. If you don't receive a letter, please call our office to schedule the follow-up appointment. 2)  Your physician recommends that you continue on your current medications as directed. Please refer to the Current Medication list given to you today.

## 2010-03-08 NOTE — Miscellaneous (Signed)
Summary: meds updated  Clinical Lists Changes  Medications: Added new medication of ASPIRIN EC 325 MG TBEC (ASPIRIN) Take one tablet by mouth daily Added new medication of ZOCOR 40 MG TABS (SIMVASTATIN) 1 tab at bedtime Added new medication of PLAVIX 75 MG TABS (CLOPIDOGREL BISULFATE) 1 tab once daily Added new medication of AMLODIPINE BESY-BENAZEPRIL HCL 5-20 MG CAPS (AMLODIPINE BESY-BENAZEPRIL HCL) 1 tab once daily Added new medication of NITROGLYCERIN 0.4 MG SUBL (NITROGLYCERIN) One tablet under tongue every 5 minutes as needed for chest pain---may repeat times three Added new medication of CO Q-10 300 MG CAPS (COENZYME Q10) 1 cap once daily Added new medication of L-LYSINE HCL 500 MG TABS (LYSINE HCL) 1 tab once daily Added new medication of SAW PALMETTO 500 MG CAPS (SAW PALMETTO (SERENOA REPENS)) 3 caps once daily Added new medication of MULTIVITAMINS   TABS (MULTIPLE VITAMIN) 1 tab once daily Added new medication of CALCIUM 600/VITAMIN D 600-400 MG-UNIT TABS (CALCIUM CARBONATE-VITAMIN D) 1 tab once daily

## 2010-03-08 NOTE — Progress Notes (Signed)
Summary: question regarding simvastatin    Phone Note Call from Patient Call back at Home Phone [phone]   Caller: Patient Reason for Call: Talk to Nurse Summary of Call: pt heard on t.v that the FDA will be put forth a  risk reduction to simvastatin .  pt has question.  Initial call taken by: Lorne Skeens,  July 14, 2009 10:24 AM  Follow-up for Phone Call        SPOKE WITH PT'S WIFE  HEARD ON TV THAT 40 MG WAS NO LONGER A RECOMMENDED DOSE  D/T INCREASE SIDE EFFECTS INFORMED HAD NOT HEARD THAT  INSTRUCTED DR Tangee Marszalek DOES NOT LIKE TO USE 80 MG  D/T INCREASE RISK IN SIDE EFFECTS PER WIFE PT REAL ACTIVE NO C/O WILL FORWARD TO DR Yanixan Mellinger FOR DIRECTIONS WIFE VERBALIZED UNDERSTANDING. Follow-up by: Scherrie Bateman, LPN,  July 14, 4780 11:43 AM  Additional Follow-up for Phone Call Additional follow up Details #1::        Not aware that 40mg  a problem,,,ask [facility] Pharm D to verify . Additional Follow-up by: Gaylord Shih, MD, [facility],  July 14, 2009 3:43 PM

## 2010-03-09 NOTE — Assessment & Plan Note (Signed)
Summary: F1Y/ANAS  Medications Added NITROGLYCERIN 0.4 MG SUBL (NITROGLYCERIN) One tablet under tongue every 5 minutes as needed for chest pain---may repeat times three FISH OIL 1000 MG CAPS (OMEGA-3 FATTY ACIDS) 1 cap once daily      Allergies Added: NKDA  Visit Type:  1 yr f/u Primary Provider:  Dr. Tanya Nones  CC:  no cardiac omplaints today..pt states he walks 20 miles per week and lifts weights 3 x weekly.  History of Present Illness: [name] comes in today for evaluation management of his coronary disease.  He is now 3 years out from a drug eluding stent to the LAD. He presented with chest burning with exertion and back burning.  He's had no recurrent symptoms. He walks 20 miles a week and lifts weights 3 times a week. His last stress test 8 and 2009 as listed below.  He is very compliant with his medications. He is always very up beat and positive  Clinical Reports Reviewed:  Nuclear Study:  08/11/2007:  Excerise capacity: Good exercise capacity  Blood Pressure response: Hypertensive blood pressure response  Clinical symptoms: No chest pain or dyspnea  ECG impression: No significant ST segment change suggestive of ischemia  Overall impression: Normal stress nuclear study.  Willa Rough, MD   Current Medications (verified): 1)  Aspirin Ec 325 Mg Tbec (Aspirin) .... Take One Tablet By Mouth Daily 2)  Zocor 40 Mg Tabs (Simvastatin) .Marland Kitchen.. 1 Tab At Bedtime 3)  Plavix 75 Mg Tabs (Clopidogrel Bisulfate) .Marland Kitchen.. 1 Tab Once Daily 4)  Amlodipine Besy-Benazepril Hcl 5-20 Mg Caps (Amlodipine Besy-Benazepril Hcl) .Marland Kitchen.. 1 Tab Once Daily 5)  Nitroglycerin 0.4 Mg Subl (Nitroglycerin) .... One Tablet Under Tongue Every 5 Minutes As Needed For Chest Pain---May Repeat Times Three 6)  Co Q-10 300 Mg Caps (Coenzyme Q10) .Marland Kitchen.. 1 Cap Once Daily 7)  Saw Palmetto 500 Mg Caps (Saw Palmetto (Serenoa Repens)) .... 3 Caps Once Daily 8)  Multivitamins   Tabs (Multiple Vitamin) .Marland Kitchen.. 1 Tab Once  Daily 9)  Calcium 600/vitamin D 600-400 Mg-Unit Tabs (Calcium Carbonate-Vitamin D) .Marland Kitchen.. 1 Tab Once Daily 10)  Fish Oil 1000 Mg Caps (Omega-3 Fatty Acids) .Marland Kitchen.. 1 Cap Once Daily  Allergies (verified): No Known Drug Allergies  Past History:  Past Medical History: Last updated: 03/08/2008 CAD Hyperlipidemia Hypertension  Past Surgical History: Last updated: 03/08/2008 Exploratory Lap at age 6 Appendectomy  Family History: Last updated: 03/08/2008 Family History of Coronary Artery Disease:   Social History: Last updated: 03/08/2008 Retired  Married  Tobacco Use - No.  Alcohol Use - yes Regular Exercise - yes Drug Use - no  Risk Factors: Exercise: yes (03/08/2008)  Risk Factors: Smoking Status: never (03/08/2008)  Review of Systems       negative other than history of present illness  Vital Signs:  Patient profile:   77 year old male Height:      67 inches Weight:      191 pounds BMI:     30.02 Pulse rate:   62 / minute Pulse rhythm:   regular BP sitting:   116 / 70  (left arm) Cuff size:   large  Vitals Entered By: Danielle Rankin, CMA (July 07, 2009 12:27 PM)  Physical Exam  General:  mildly overweight, alert oriented, very pleasant no acute distress Head:  normocephalic and atraumatic Eyes:  PERRLA/EOM intact; conjunctiva and lids normal. Neck:  Neck supple, no JVD. No masses, thyromegaly or abnormal cervical nodes. Chest [name]:  no deformities or breast  masses noted Lungs:  Clear bilaterally to auscultation and percussion. Heart:  Non-displaced PMI, chest non-tender; regular rate and rhythm, S1, S2 without murmurs, rubs or gallops. Carotid upstroke normal, no bruit. Normal abdominal aortic size, no bruits. Femorals normal pulses, no bruits. Pedals normal pulses. No edema, no varicosities. Abdomen:  Bowel sounds positive; abdomen soft and non-tender without masses, organomegaly, or hernias noted. No hepatosplenomegaly. Msk:  Back normal, normal gait. Muscle  strength and tone normal. Pulses:  pulses normal in all 4 extremities Extremities:  No clubbing or cyanosis. Neurologic:  Alert and oriented x 3. Skin:  Intact without lesions or rashes. Psych:  Normal affect.   Nuclear Study  Procedure date:  08/11/2007  Findings:      Excerise capacity: Good exercise capacity  Blood Pressure response: Hypertensive blood pressure response  Clinical symptoms: No chest pain or dyspnea  ECG impression: No significant ST segment change suggestive of ischemia  Overall impression: Normal stress nuclear study.  Willa Rough, MD     Impression & Recommendations:  Problem # 1:  CAD, NATIVE VESSEL (ICD-414.01) Assessment Unchanged  His updated medication list for this problem includes:    Aspirin Ec 325 Mg Tbec (Aspirin) .Marland Kitchen... Take one tablet by mouth daily    Plavix 75 Mg Tabs (Clopidogrel bisulfate) .Marland Kitchen... 1 tab once daily    Amlodipine Besy-benazepril Hcl 5-20 Mg Caps (Amlodipine besy-benazepril hcl) .Marland Kitchen... 1 tab once daily    Nitroglycerin 0.4 Mg Subl (Nitroglycerin) ..... One tablet under tongue every 5 minutes as needed for chest pain---may repeat times three  His updated medication list for this problem includes:    Aspirin Ec 325 Mg Tbec (Aspirin) .Marland Kitchen... Take one tablet by mouth daily    Plavix 75 Mg Tabs (Clopidogrel bisulfate) .Marland Kitchen... 1 tab once daily    Amlodipine Besy-benazepril Hcl 5-20 Mg Caps (Amlodipine besy-benazepril hcl) .Marland Kitchen... 1 tab once daily    Nitroglycerin 0.4 Mg Subl (Nitroglycerin) ..... One tablet under tongue every 5 minutes as needed for chest pain---may repeat times three  Problem # 2:  HYPERLIPIDEMIA TYPE IIB / III (ICD-272.2)  His updated medication list for this problem includes:    Zocor 40 Mg Tabs (Simvastatin) .Marland Kitchen... 1 tab at bedtime  His updated medication list for this problem includes:    Zocor 40 Mg Tabs (Simvastatin) .Marland Kitchen... 1 tab at bedtime  Problem # 3:  HYPERTENSION, BENIGN (ICD-401.1)  His updated  medication list for this problem includes:    Aspirin Ec 325 Mg Tbec (Aspirin) .Marland Kitchen... Take one tablet by mouth daily    Amlodipine Besy-benazepril Hcl 5-20 Mg Caps (Amlodipine besy-benazepril hcl) .Marland Kitchen... 1 tab once daily  His updated medication list for this problem includes:    Aspirin Ec 325 Mg Tbec (Aspirin) .Marland Kitchen... Take one tablet by mouth daily    Amlodipine Besy-benazepril Hcl 5-20 Mg Caps (Amlodipine besy-benazepril hcl) .Marland Kitchen... 1 tab once daily  Patient Instructions: 1)  Your physician recommends that you schedule a follow-up appointment in: 12 MONTHS WITH DR Peri Kreft 2)  Your physician recommends that you continue on your current medications as directed. Please refer to the Current Medication list given to you today. Prescriptions: NITROGLYCERIN 0.4 MG SUBL (NITROGLYCERIN) One tablet under tongue every 5 minutes as needed for chest pain---may repeat times three  #25 x 9   Entered by:   Danielle Rankin, CMA   Authorized by:   Gaylord Shih, MD, [facility]   Signed by:   Danielle Rankin, CMA on 07/07/2009   Method  used:   Electronically to        [facility]  [phone]* (retail)       [address] [place], [place]  [address]       Ph: [phone] or [phone]       Fax: [phone]   RxID:   [id number]   Appended Document: F1Y/ANAS

## 2010-06-20 NOTE — Assessment & Plan Note (Signed)
[facility]                            CARDIOLOGY OFFICE NOTE   NAME:[name], [name]                     MRN:          [id number]  DATE:08/23/2006                            DOB:          [birth year]    [name] returns today after being discharged from the hospital on  July 31, 2006.  He presented with classic exertional chest discomfort.  He came to [facility] ED per his primary care physician, Dr. Loralie [name]  request.   He ruled out for myocardial infarction.  He had cardiac catheterization  on June 24 that showed a 95% LAD.  He had nonobstructive disease  otherwise.  EF was 55-60%.   Dr. Tonny [name] placed a 3x23 mm CYPHER drug-eluting stent.  He had a  post-procedural bump in his enzymes with a CPK/MB of 14.5, troponin of  2.03.   Carvedilol 6.15, Zocor 40 mg nightly, aspirin 325 a day, Plavix 75 mg  daily for a year were added.  He was discharged home.   Since discharge, he feels great.  He is having no exertional angina.  He  is anxious to start lifting his weighs again.   During his stay, he also had a CT scan of the abdomen, which showed no  retroperitoneal hematoma.  It showed some mild prostatic hypertrophy,  moderate-sized right groin hematoma.  There was no aneurysm.   He is extremely motivated and intelligent.   MEDICATIONS:  As listed above.  He carries sublingual nitroglycerin as  well.  He also takes calcium, vitamin D, multivitamins, saw palmetto,  fish oil, L-carnatine, L-lysine, CoQ10.   EXAM:  Blood pressure is 133/75, pulse 61 and regular, his weight is  173.  HEENT:  Normocephalic, atraumatic.  PERRLA.  Extraocular movements  intact.  Sclerae clear.  Facial symmetry is normal.  NECK:  Supple.  Carotid upstrokes are equal bilaterally without bruits.  No JVD.  Thyroid is not enlarged.  Trachea is midline.  LUNGS:  Clear.  HEART:  Regular rate and rhythm.  No gallop.  ABDOMEN:  Soft with good bowel sounds.  No  midline bruit.  No  hepatomegaly.  His right cath site is stable.  EXTREMITIES:  Reveal no cyanosis, clubbing, or edema.  Pulses are  intact.   ELECTROCARDIOGRAM:  Shows sinus bradycardia with a normal EKG otherwise.   ASSESSMENT AND PLAN:  [name] is doing well after presenting with  exertional angina with subsequent percutaneous coronary intervention  with a drug-eluting stent to a high-grade left anterior descending.  He  has normal left ventricular systolic function.  He had a slight bump in  his enzymes post procedure.  He also had a right groin hematoma, which  has resolved.  He has relative hyperlipidemia and hypertension.  These  are being treated appropriately.   PLAN:  1. Check LFTs and lipid panel in 3 to 4 weeks.  We will make      adjustments as necessary, but goal LDL is less than 70.  2. Continue his other medications.  3. See me back in 2 months.     [name] C.  Daleen Squibb, MD, [facility]  Electronically Signed    TCW/MedQ  DD: 08/23/2006  DT: 08/24/2006  Job #: [id number]   cc:   [name]

## 2010-06-20 NOTE — Assessment & Plan Note (Signed)
[facility]                            CARDIOLOGY OFFICE NOTE   NAME:[name], [name]                     MRN:          [id number]  DATE:08/04/2007                            DOB:          1933/04/29    [name] returns today for a followup.   PROBLEMS:  1. Coronary disease status post Cypher stent for crescendo angina in      June 2008.  He exercises on a regular basis.  He is not having      recurrent ischemic symptoms at present.  2. Hyperlipidemia.  He has had an excellent response to simvastatin      and is due to follow up lipids.  3. Hypertension.  The Carvedilol did not work, but his      amlodipine/benazepril has done a great job with his blood pressure.   His drug-eluting stent and a questions whether or not he should stop  Plavix, I told him that if he can afford it and he is not having any  bleeding problems, we are opting to continue it as long as possible.   He denies any orthopnea, PND, or peripheral edema.  His wife is  chronically ill, his primary care [name].  He does suffers from some  depression.   MEDICATIONS:  His current meds are Zocor 40 mg at bedtime, aspirin 325  mg a day, and Plavix 75 mg a day.  Some alternative medications, fish  oil 2000 mg a day; amlodipine 5 mg/benazepril 20 mg daily.  He carries  sublingual nitroglycerin.   PHYSICAL EXAMINATION:  VITAL SIGNS:  His blood pressure today is 128/70,  his pulse 60 and regular, and weight 183.  GENERAL:  He is in no acute distress.  Alert and oriented.  SKIN:  Warm and dry.  HEENT:  Unchanged.  Carotid upstrokes equal and bilateral without  bruits.  No JVD.  Thyroid is not enlarged.  Trachea is midline.  LUNGS:  Clear.  HEART:  Reveals regular rate and rhythm.  Normal PMI.  ABDOMEN:  Soft.  Good bowel sounds.  No midline bruit.  No hepatomegaly.  There is no tenderness.  EXTREMITIES:  No cyanosis, clubbing, or edema.  Pulses are intact.  NEURO:  Intact.  SKIN:  Unremarkable.   [name] is doing remarkably well.  We will arrange for an objective  assessment of his coronary disease with an exercise stress Myoview.  We  will obtain comprehensive metabolic  panel and lipid panel that morning that he fasts.  I made no changes to  medical program.  Assuming his stress test is unremarkable, I  will see  him back in a year.     [name] C. Daleen Squibb, MD, [facility]  Electronically Signed    TCW/MedQ  DD: 08/04/2007  DT: 08/05/2007  Job #: [id number]   cc:   [name]

## 2010-06-20 NOTE — H&P (Signed)
NAME:  [name], [name]            ACCOUNT NO.:  [id number]   MEDICAL RECORD NO.:  [id number]          PATIENT TYPE:  OBV   LOCATION:  1825                         FACILITY:  [facility]   PHYSICIAN:  Thomas C. Wall, MD, FACCDATE OF BIRTH:  16-Apr-1933   DATE OF ADMISSION:  07/29/2006  DATE OF DISCHARGE:                              HISTORY & PHYSICAL   CHIEF COMPLAINT:  Chest tightness and squeezing with walking.   HISTORY OF PRESENT ILLNESS:  Mr. Auxier is a delightful 77 year old  white male with no previous cardiac history who is extremely active  walking and lifting weights.   He has had 2 episodes of chest discomfort, the last one being a  squeezing discomfort in his chest.  This is clearly exertional related.   His symptoms began at 6 a.m., an 8/10 in severity with some shortness of  breath and chest pain, only lasted 15 minutes.   In the ER, his EKG was normal.  CT scan showed no pulmonary embolus.  Cardiac enzymes were negative.   He has had some chronic back pain from lifting his wife.   ALLERGIES:  He has no known drug allergies.  He is intolerant to VICODIN  with some nausea and vomiting.   MEDICATIONS:  Centrum Silver, calcium, vitamin D, and saw palmetto.   PAST MEDICAL HISTORY:  1. He has a history of hyperlipidemia that resolved with weight loss.  2. He has a history of hypertension, resolved with exercise.  3. History of diabetes, which is diet controlled, now resolved.  4. He has had an appendectomy and exploratory laparotomy at age 36.   SOCIAL HISTORY:  He lives in [place] with his wife.  He quit  smoking 40 years ago.  He drinks 1 glass of wine a day.  He is a retired  Runner, broadcasting/film/video, now Catering manager.   FAMILY HISTORY:  Father died at 66 with coronary artery disease.  He has  a brother who does not have coronary artery disease, another brother who  has aortic aneurysm.   REVIEW OF SYSTEMS:  Other than HPI it is negative.  He does have some  vision  and hearing loss.  He has had no gastroesophageal reflux.  No  nausea and vomiting.  He has had some diarrhea prior to admission, and  no hematochezia, hematemesis.   PHYSICAL EXAMINATION:  VITAL SIGNS:  His blood pressure is 188/95, which  has come down with nitroglycerin.  His pulse is 78, respiratory is 18  nonlabored, his temp is 97.9, O2 sat is 98% on room air.  GENERAL:  He is in no acute distress.  HEENT:  Normocephalic, atraumatic.  Extraocular movements intact.  Sclerae clear.  Facial symmetry is normal.  NECK:  Supple.  There is no JVD.  There is no thyromegaly.  Carotids  full without obvious bruit.  There is no evidence of lymphadenopathy.  CARDIOVASCULAR:  Regular rate and rhythm without murmur, rub or gallop.  LUNGS:  Clear to auscultation and percussion.  SKIN:  Shows a cyst on the back of his neck, which has been there for a  number years.  It has been slightly inflamed and sore lately.  It feels  slightly hot.  ABDOMINAL:  Soft, nontender.  Negative organomegaly.  Negative bruit.  EXTREMITIES:  No clubbing, cyanosis or edema.  Pulses are present.  There is a question of bilateral femoral bruits.  MUSCULOSKELETAL:  Negative.  NEUROLOGICAL:  Negative.   A chest x-ray - congestion with mild edema.  CT negative, pulmonary  embolus resection.  He has some thoracic spine degenerative changes.   LABORATORY DATA:  Unremarkable, including a point of care markers  negative x1.  His creatinine is 1.   EKG is normal.   ASSESSMENT:  Exertional angina.  Cardiac risk factors include age, sex,  history of hyperlipidemia, which is improved with weight loss, history  of hypertension, which he is today, and a history of type 2 diabetes,  resolved with weight loss.   RECOMMENDATIONS:  1. Continue IV nitroglycerin and initiate a beta blocker, aspirin.  2. Cardiac catheterization tomorrow morning.  Indications, risks and      potential benefits were discussed with the patient, and  he agrees      to proceed.  3. Fasting lipids.  4. TSH.      Thomas C. Daleen Squibb, MD, [facility]  Electronically Signed     TCW/MEDQ  D:  07/29/2006  T:  07/29/2006  Job:  [id number]   cc:   Margaree Mackintosh

## 2010-06-20 NOTE — Cardiovascular Report (Signed)
[name], [name] NO.:  [id number]   MEDICAL RECORD NO.:  [id number]          PATIENT TYPE:  OBV   LOCATION:  2807                         FACILITY:  [facility]   PHYSICIAN:  Veverly Fells. Excell Seltzer, MD  DATE OF BIRTH:  [birth year]   DATE OF PROCEDURE:  07/30/2006  DATE OF DISCHARGE:                            CARDIAC CATHETERIZATION   PROCEDURES:  1. Left heart catheterization.  2. Selective coronary angiography.  3. Left ventricular angiography.  4. Percutaneous transluminal cardiac angioplasty and stenting of the      left anterior descending.   INDICATIONS:  Mr. Blank is a delightful 77 year old gentleman who  presented with classic unstable angina.  His EKG and cardiac biomarkers  were normal, but his symptoms were very compelling for crescendo angina,  and he was referred for cardiac catheterization.   He risks and indications of the procedure were explained to the patient.  Informed consent was obtained.  The right groin was prepped, draped and  anesthetized with 1% lidocaine.  Using the modified Seldinger technique,  a 6-French sheath was placed in the right femoral artery.  Multiple  views of the left and right coronary arteries were taken using standard  preformed Judkins catheters.  Following selective coronary angiography,  an angled pigtail catheter was inserted into the left ventricle, and  pressures were recorded.  A left ventriculogram was performed. A  pullback across the aortic valve was done.   At the conclusion of the procedure, I elected to intervene on the LAD.  There was a high-grade proximal LAD stenosis in the range of 95%.  I  suspect that this was the patient's culprit vessel.  He was preloaded  with 600 mg clopidogrel and given 20 mg of intravenous Pepcid.  Angiomax  was used for anticoagulation.  Once therapeutic ACT was achieved, and XB  3.5 guide catheter was inserted, and a Cougar guidewire was used to  cross the lesion.  The  lesion was crossed without difficulty, and the  wire was placed in the mid to distal LAD.  The lesion was predilated  with a 2.5 x 15 mm Maverick balloon to 8 atmospheres.  The balloon  inflation reproduced the patient's symptoms.  The balloon was well  expanded, and there was TIMI-3 flow in the vessel following  predilatation.  I elected to stent the vessel with a 3-0 x 23 mm Cypher  stent which was deployed at 14 atmospheres.  The stent was then  postdilated with a 3.25 x 20 mm Quantum Maverick balloon to 18  atmospheres.  There was an excellent angiographic result.  The patient  was pain free at the end of the procedure. There was TIMI-3 flow, and  the stent was well expanded.  The stent crossed a medium-size diagonal  branch that was patent with TIMI-3 flow at the conclusion of the  procedure.  The guide catheter and wire were removed, and the patient  was transferred to the recovery area in stable condition.  There were no  immediate complications.   FINDINGS:  Aortic pressure 173/83 with a mean of 123, left ventricular  pressure 174/18.  Left mainstem is angiographically normal.  It bifurcates into the LAD  and left circumflex.   The LAD is a large-caliber vessel that courses down to the LV apex.  There is a high-grade focal irregular stenosis in the proximal portion  of the vessel.  It is 95% occlusive.  There is a medium-size diagonal  branch that has a 70% ostial stenosis arising from the area of the  lesion.  The remaining portions of the mid and distal LAD have diffuse  nonobstructive stenosis.  There is a medium-size third diagonal branch,  and just beyond that branch, there is a 30% stenosis.   The left circumflex is a large-caliber vessel.  It courses down and is  at least a 4 mm vessel in its proximal and mid portion.  That portion of  the vessel has a 25-30% focal stenosis.  It provides a large left  posterolateral branch that bifurcates into multiple branches and   supplies much of the inferolateral wall.  The second branch of that  vessel has a 30-40% stenosis at its origin.  The mid circumflex  following the posterolateral branch is a small vessel.   The right coronary artery is diffusely diseased.  It is a medium-size  vessel that has a 60% proximal stenosis followed by 40% mid stenosis.  Distally, there is a 50% stenosis just before the PDA, and the origin of  the PDA branch has a 75% stenosis. Further out in the PDA, there is a 70-  75% stenosis in the mid portion of that vessel.  The PDA is only a 2 mm  vessel.   Left ventricular function assessed by 30-degree RAO left  ventriculography is normal.  The LVEF is 55-60%.  There is no mitral  regurgitation.   ASSESSMENT:  1. Critical proximal left anterior descending stenosis treated with a      single drug-eluting stent as above.  2. Moderate diffuse right coronary artery stenosis.  3. Nonobstructive left circumflex stenosis.  4. Normal left ventricular function.  5. Successful percutaneous coronary intervention  of the left anterior      descending.   PLAN:  The patient will be transferred to the post catheterization  recovery area where the sheath will be pulled in 2 hours.  He should  receive aspirin and clopidogrel for a minimum of 1 year.  He will he  will require medical therapy for his coronary artery disease with  initiation of a statin  and a beta blocker.      Veverly Fells. Excell Seltzer, MD  Electronically Signed     MDC/MEDQ  D:  07/30/2006  T:  07/30/2006  Job:  [id number]   cc:   Thomas C. Wall, MD, [facility]

## 2010-06-20 NOTE — Discharge Summary (Signed)
[name], [name]            ACCOUNT NO.:  [id number]   MEDICAL RECORD NO.:  [id number]          PATIENT TYPE:  OBV   LOCATION:  6525                         FACILITY:  [facility]   PHYSICIAN:  Veverly Fells. Excell Seltzer, MD  DATE OF BIRTH:  1933-05-16   DATE OF ADMISSION:  07/29/2006  DATE OF DISCHARGE:  07/31/2006                               DISCHARGE SUMMARY   DISCHARGE DIAGNOSIS:  Coronary artery disease.   SECONDARY DIAGNOSES:  1. Hyperlipidemia.  2. Diet controlled diabetes mellitus.  3. Hypertension.  4. Status post appendectomy.  5. Status post exploratory laparoscopy approximately 11 years ago.  6. History of premature ventricular contractions.  7. Remote tobacco abuse, quitting 40 years ago.   ALLERGIES:  VICODIN CAUSES NAUSEA AND VOMITING.   PROCEDURE:  Left heart cardiac catheterization with successful PCI and  stenting of the proximal LAD with placement of a 3 x 23 mm Cypher drug  eluting stent.   HISTORY OF PRESENT ILLNESS:  A 77 year old Caucasian male without prior  known history of coronary disease who was in his usual state of health  until 6 a.m. on July 29, 2006 when he developed 8/10 substernal chest  discomfort associated with shortness of breath lasting approximately 15  minutes and resolving spontaneously.  Despite his chest discomfort  resolving, he continued to have some back discomfort and he presented to  the [facility] ED where ECG showed no acute changes and cardiac markers  were negative.  On further questioning, the patient reported an  approximate 3-week history of exertional burning occurring in his back  resolving with rest.  The patient was admitted for further evaluation.   HOSPITAL COURSE:  [name] ruled out for MI and decision was made to  proceed with cardiac catheterization which was performed on July 30, 2006 revealing a 95% stenosis in the proximal LAD, and otherwise  nonobstructive coronary artery disease.  His EF was 55 to 60%.  Attention was turned to the LAD, and he underwent successful placement  of 3 x 23mm Cypher drug eluting stent.  The patient tolerated this  procedure well.  Post procedure, he had mild elevation in his CK MB to  14.5 with a troponin of 2.03.  He has otherwise been ambulating without  discomfort and is being discharged home today in satisfactory condition.   DISCHARGE LABS:  Hemoglobin 12.8, hematocrit 38, WBC 7.4, platelets  209,000, MCV 94.  Sodium 140, potassium 4.4, chloride 103, CO2 30, BUN  12, creatinine 0.97, glucose 88.  PT 12.7, INR 0.9, PTT 27.  Total  bilirubin 0.6.  Alkaline phosphatase 58.  AST 24, ALT 15.  Albumin 3.5.  CK 192, MB 14.5, troponin I 2.03.  calcium 8.9.  BNP 45.  TSH 0.673.   DISPOSITION:  The patient is being discharged home today in good  condition.   FOLLOW UP PLANS AND APPOINTMENTS:  He is to follow up with Dr. Valera Castle on August 23, 2006 at 4:30.  He is also to follow up with his primary  care physician, Dr. Nolen Mu, at [facility] in 3 to 4  weeks p.r.n. discharge.   MEDICATIONS:  1. Coreg 6.25 mg b.i.d.  2. Zocor 40 mg q.h.s.  3. Aspirin 325 mg every day.  4. Plavix 75 mg every day.  5. Nitroglycerin 0.4 mg sublingual p.r.n. chest pain.   OUTSTANDING LAB STUDIES:  None.   DURATION OF DISCHARGE ENCOUNTER:  Forty minutes including physician  time.      Nicolasa Ducking, ANP      Veverly Fells. Excell Seltzer, MD  Electronically Signed    CB/MEDQ  D:  07/31/2006  T:  07/31/2006  Job:  [id number]   cc:   Dr. Nolen Mu

## 2010-06-20 NOTE — Assessment & Plan Note (Signed)
[facility]                            CARDIOLOGY OFFICE NOTE   NAME:[name], [name]                     MRN:          [id number]  DATE:10/17/2006                            DOB:          Oct 01, 1933    [name] returns today for further management of the following  problems:  1. Coronary artery disease, status post CYPHER stent to the LAD, June      2008.  He is having no recurrent ischemic symptoms.  2. Hyperlipidemia.  He has had an excellent response to simvastatin      and his lipids are at goal.  3. Hypertension.  His blood pressure has been elevated at 160-170, and      he says the carvedilol is not working.  He was on Lotrel 5/40 prior      to admission.   CURRENT MEDICATIONS:  1. Zocor 40 mg q.h.s.  2. Aspirin 325 mg a day.  3. Plavix 75 mg a day.  4. Carvedilol 6.25 b.i.d.   PHYSICAL EXAMINATION:  VITAL SIGNS:  His blood pressure is 150/82.  His  pulse 64 and regular.  Weight is 172.  HEENT:  Unchanged.  NECK:  Carotids are full with no bruits.  Thyroid is not enlarged.  Trachea is midline.  LUNGS:  Clear.  HEART:  Reveals a normal S1 S2.  Regular rate and rhythm.  ABDOMEN:  Soft.  Good bowel sounds.  EXTREMITIES:  Reveal no edema.  Pulses are intact.  NEUROLOGIC:  Intact.   ASSESSMENT:  [name]' blood pressure is under suboptimal control.   I have asked he start back on his Lotrel 5/40 and to follow up with Dr.  Nolen [name].  I will see him back in June 2009, at that time we may be able  to discontinue his Plavix.  If he has any recurrent exertional angina,  he will report back to [place].     Thomas C. Daleen Squibb, MD, [facility]  Electronically Signed    TCW/MedQ  DD: 10/17/2006  DT: 10/17/2006  Job #: [id number]   cc:   [name]

## 2010-06-27 ENCOUNTER — Encounter: Payer: Self-pay | Admitting: Cardiology

## 2010-06-28 ENCOUNTER — Encounter: Payer: Self-pay | Admitting: Cardiology

## 2010-07-07 ENCOUNTER — Encounter: Payer: Self-pay | Admitting: Cardiology

## 2010-07-07 ENCOUNTER — Ambulatory Visit (INDEPENDENT_AMBULATORY_CARE_PROVIDER_SITE_OTHER): Payer: Medicare Other | Admitting: Cardiology

## 2010-07-07 VITALS — BP 152/82 | HR 78 | Resp 18 | Ht 67.0 in | Wt 188.0 lb

## 2010-07-07 DIAGNOSIS — I2510 Atherosclerotic heart disease of native coronary artery without angina pectoris: Secondary | ICD-10-CM

## 2010-07-07 NOTE — Patient Instructions (Signed)
Your physician recommends that you schedule a follow-up appointment in: 1 year with Dr. Wall  

## 2010-07-07 NOTE — Progress Notes (Signed)
HPI [name] returns for evaluation and management of his coronary disease. He takes good care of himself and is most compliant. His wife has developed cancer again. He spends a lot of time taking care of her.  He denies any angina or ischemic symptoms. He exercises on a regular basis. Laboratory data followed by primary care and has been stable.  EKG today shows normal sinus rhythm with a PVC. No acute changes. Past Medical History  Diagnosis Date  . CAD (coronary artery disease)   . Hyperlipidemia   . HTN (hypertension)     Past Surgical History  Procedure Date  . Exploratory laparotomy     age 77  . Appendectomy   . Cardiac catheterization 07/30/2006    CORONARY ANGIOPLASTY WITH STENT PLACEMENT    Family History  Problem Relation Age of Onset  . Coronary artery disease Father 92  . Coronary artery disease Brother   . Aortic aneurysm Brother     History   Social History  . Marital Status: Married    Spouse Name: N/A    Number of Children: N/A  . Years of Education: N/A   Occupational History  . Not on file.   Social History Main Topics  . Smoking status: Former Games developer  . Smokeless tobacco: Not on file   Comment: quit over 3yrs ago.  . Alcohol Use: No  . Drug Use: No  . Sexually Active: Not on file   Other Topics Concern  . Not on file   Social History Narrative  . No narrative on file    No Known Allergies  Current Outpatient Prescriptions  Medication Sig Dispense Refill  . amLODipine-benazepril (LOTREL) 5-20 MG per capsule Take 1 capsule by mouth daily.        Marland Kitchen aspirin 325 MG EC tablet Take by mouth daily.        Marland Kitchen atorvastatin (LIPITOR) 40 MG tablet Take 40 mg by mouth daily.        . Calcium Carbonate-Vitamin D (CALCARB 600/D) 600-400 MG-UNIT per tablet Take by mouth daily.        . clopidogrel (PLAVIX) 75 MG tablet Take by mouth daily.        . Coenzyme Q10 (CO Q-10) 300 MG CAPS Take 100 mg by mouth daily.       . fish oil-omega-3 fatty acids  1000 MG capsule Take by mouth daily.        . Multiple Vitamin (MULTIVITAMIN) capsule Take 1 capsule by mouth daily.        . nitroGLYCERIN (NITROSTAT) 0.4 MG SL tablet Place under the tongue every 5 (five) minutes as needed.        . Tamsulosin HCl (FLOMAX) 0.4 MG CAPS Take 0.4 mg by mouth daily.        Marland Kitchen DISCONTD: saw palmetto 500 MG capsule Take by mouth. 3 Capsule daily.       Marland Kitchen DISCONTD: simvastatin (ZOCOR) 40 MG tablet Take by mouth at bedtime.          ROS Negative other than HPI.   PE General Appearance: well developed, well nourished in no acute distress HEENT: symmetrical face, PERRLA, good dentition  Neck: no JVD, thyromegaly, or adenopathy, trachea midline Chest: symmetric without deformity Cardiac: PMI non-displaced, RRR, normal S1, S2, no gallop or murmur Lung: clear to ausculation and percussion Vascular: all pulses full without bruits  Abdominal: nondistended, nontender, good bowel sounds, no HSM, no bruits Extremities: no cyanosis, clubbing or edema, no sign  of DVT, no varicosities  Skin: normal color, no rashes Neuro: alert and oriented x 3, non-focal Pysch: normal affect Filed Vitals:   07/07/10 0906  BP: 152/82  Pulse: 78  Resp: 18  Height: 5\' 7"  (1.702 m)  Weight: 188 lb (85.276 kg)    EKG  Labs and Studies Reviewed.   No results found for this basename: WBC, HGB, HCT, MCV, PLT      Chemistry      Component Value Date/Time   NA 143 08/11/2007 0849   K 4.6 08/11/2007 0849   CL 109 08/11/2007 0849   CO2 28 08/11/2007 0849   BUN 20 08/11/2007 0849   CREATININE 1.0 08/11/2007 0849      Component Value Date/Time   CALCIUM 9.2 08/11/2007 0849   ALKPHOS 59 08/11/2007 0849   AST 26 08/11/2007 0849   ALT 15 08/11/2007 0849   BILITOT 0.7 08/11/2007 0849       Lab Results  Component Value Date   CHOL 150 08/11/2007   CHOL 118 09/24/2006   Lab Results  Component Value Date   HDL 49.8 08/11/2007   HDL 45.3 09/24/2006   Lab Results  Component Value Date   LDLCALC 87  08/11/2007   LDLCALC 61 09/24/2006   Lab Results  Component Value Date   TRIG 66 08/11/2007   TRIG 59 09/24/2006   Lab Results  Component Value Date   CHOLHDL 3.0 CALC 08/11/2007   CHOLHDL 2.6 CALC 09/24/2006   No results found for this basename: HGBA1C   Lab Results  Component Value Date   ALT 15 08/11/2007   AST 26 08/11/2007   ALKPHOS 59 08/11/2007   BILITOT 0.7 08/11/2007   No results found for this basename: TSH

## 2010-07-07 NOTE — Assessment & Plan Note (Signed)
Stable. No change in medications. See back in one year.

## 2010-07-18 ENCOUNTER — Other Ambulatory Visit: Payer: Self-pay | Admitting: *Deleted

## 2010-07-18 MED ORDER — CLOPIDOGREL BISULFATE 75 MG PO TABS: 75.0000 mg | ORAL_TABLET | Freq: Every day | ORAL | Status: DC

## 2010-09-13 ENCOUNTER — Telehealth: Payer: Self-pay | Admitting: Cardiology

## 2010-09-13 NOTE — Telephone Encounter (Signed)
Release Form received via Mail, All Cardiac Records copied Mailed to Pt 09/13/10/km

## 2010-09-25 ENCOUNTER — Other Ambulatory Visit: Payer: Self-pay | Admitting: Cardiology

## 2010-11-03 LAB — CBC
HCT: 41.4
Hemoglobin: 13.9
MCHC: 33.5
MCV: 95.8
Platelets: 224
RBC: 4.33
RDW: 12.8
WBC: 5.7

## 2010-11-03 LAB — PROTIME-INR
INR: 1
Prothrombin Time: 12.8

## 2010-11-03 LAB — DIFFERENTIAL
Basophils Absolute: 0
Basophils Relative: 0
Eosinophils Absolute: 0.6
Eosinophils Relative: 11 — ABNORMAL HIGH
Lymphocytes Relative: 23
Lymphs Abs: 1.3
Monocytes Absolute: 0.6
Monocytes Relative: 10
Neutro Abs: 3.2
Neutrophils Relative %: 56

## 2010-11-03 LAB — POCT CARDIAC MARKERS
CKMB, poc: 1.1
Myoglobin, poc: 66.9
Operator id: 234501
Troponin i, poc: <0.05

## 2010-11-03 LAB — POCT I-STAT, CHEM 8
BUN: 23
Calcium, Ion: 1.15
Chloride: 105
Creatinine, Ser: 1.1
Glucose, Bld: 104 — ABNORMAL HIGH
HCT: 41
Hemoglobin: 13.9
Potassium: 4.2
Sodium: 139
TCO2: 27

## 2010-11-03 LAB — APTT: aPTT: 32

## 2010-11-22 LAB — I-STAT 8, (EC8 V) (CONVERTED LAB)
Acid-Base Excess: 3 — ABNORMAL HIGH
BUN: 21
Bicarbonate: 25.7 — ABNORMAL HIGH
Chloride: 106
Glucose, Bld: 111 — ABNORMAL HIGH
HCT: 47
Hemoglobin: 16
Operator id: 279831
Potassium: 4.3
Sodium: 138
TCO2: 27
pCO2, Ven: 33.4 — ABNORMAL LOW
pH, Ven: 7.495 — ABNORMAL HIGH

## 2010-11-22 LAB — CK TOTAL AND CKMB (NOT AT ARMC)
CK, MB: 1.7
CK, MB: 12 — ABNORMAL HIGH
Relative Index: 6.3 — ABNORMAL HIGH
Relative Index: INVALID
Total CK: 192
Total CK: 75

## 2010-11-22 LAB — BASIC METABOLIC PANEL
BUN: 12
BUN: 15
CO2: 27
CO2: 30
Calcium: 8.9
Calcium: 9
Chloride: 103
Chloride: 105
Creatinine, Ser: 0.86
Creatinine, Ser: 0.97
GFR calc Af Amer: >60
GFR calc Af Amer: >60
GFR calc non Af Amer: >60
GFR calc non Af Amer: >60
Glucose, Bld: 106 — ABNORMAL HIGH
Glucose, Bld: 88
Potassium: 4.4
Potassium: 5.1
Sodium: 138
Sodium: 140

## 2010-11-22 LAB — SAMPLE TO BLOOD BANK

## 2010-11-22 LAB — COMPREHENSIVE METABOLIC PANEL
ALT: 15
AST: 24
Albumin: 3.5
Alkaline Phosphatase: 58
BUN: 18
CO2: 26
Calcium: 8.6
Chloride: 106
Creatinine, Ser: 0.91
GFR calc Af Amer: >60
GFR calc non Af Amer: >60
Glucose, Bld: 180 — ABNORMAL HIGH
Potassium: 3.9
Sodium: 138
Total Bilirubin: 0.6
Total Protein: 5.8 — ABNORMAL LOW

## 2010-11-22 LAB — CARDIAC PANEL(CRET KIN+CKTOT+MB+TROPI)
CK, MB: 1.7
CK, MB: 1.7
CK, MB: 10.6 — ABNORMAL HIGH
CK, MB: 14.5 — ABNORMAL HIGH
Relative Index: 7.6 — ABNORMAL HIGH
Relative Index: 8.4 — ABNORMAL HIGH
Relative Index: INVALID
Relative Index: INVALID
Total CK: 126
Total CK: 192
Total CK: 68
Total CK: 81
Troponin I: 0.03
Troponin I: 0.04
Troponin I: 0.76
Troponin I: 2.03

## 2010-11-22 LAB — TROPONIN I: Troponin I: 0.05

## 2010-11-22 LAB — DIFFERENTIAL
Basophils Absolute: 0
Basophils Relative: 0
Eosinophils Absolute: 0.4
Eosinophils Relative: 3
Lymphocytes Relative: 12
Lymphs Abs: 1.2
Monocytes Absolute: 0.6
Monocytes Relative: 6
Neutro Abs: 8 — ABNORMAL HIGH
Neutrophils Relative %: 78 — ABNORMAL HIGH

## 2010-11-22 LAB — POCT I-STAT CREATININE
Creatinine, Ser: 1
Operator id: 279831

## 2010-11-22 LAB — POCT CARDIAC MARKERS
CKMB, poc: 1.3
Myoglobin, poc: 64.3
Operator id: 279831
Troponin i, poc: <0.05

## 2010-11-22 LAB — CBC
HCT: 38 — ABNORMAL LOW
HCT: 38.4 — ABNORMAL LOW
HCT: 43.3
Hemoglobin: 12.8 — ABNORMAL LOW
Hemoglobin: 13
Hemoglobin: 14.7
MCHC: 33.6
MCHC: 33.9
MCHC: 33.9
MCV: 92.8
MCV: 93
MCV: 94
Platelets: 209
Platelets: 221
Platelets: 259
RBC: 4.05 — ABNORMAL LOW
RBC: 4.13 — ABNORMAL LOW
RBC: 4.67
RDW: 12.6
RDW: 12.8
RDW: 12.8
WBC: 10.2
WBC: 7.4
WBC: 8.8

## 2010-11-22 LAB — LIPID PANEL
Cholesterol: 188
HDL: 48
LDL Cholesterol: 122 — ABNORMAL HIGH
Total CHOL/HDL Ratio: 3.9
Triglycerides: 91
VLDL: 18

## 2010-11-22 LAB — B-NATRIURETIC PEPTIDE (CONVERTED LAB): Pro B Natriuretic peptide (BNP): 45

## 2010-11-22 LAB — APTT: aPTT: 27

## 2010-11-22 LAB — TSH: TSH: 0.673

## 2010-11-22 LAB — PROTIME-INR
INR: 0.9
Prothrombin Time: 12.7

## 2011-02-09 ENCOUNTER — Ambulatory Visit
Admission: RE | Admit: 2011-02-09 | Discharge: 2011-02-09 | Disposition: A | Discharge status: Home / Self Care | Payer: Medicare Other | Source: Ambulatory Visit | Attending: Family Medicine | Admitting: Family Medicine

## 2011-02-09 ENCOUNTER — Other Ambulatory Visit: Payer: Self-pay | Admitting: Family Medicine

## 2011-02-09 DIAGNOSIS — R7989 Other specified abnormal findings of blood chemistry: Secondary | ICD-10-CM

## 2011-02-09 IMAGING — US US ABDOMEN COMPLETE
1 series · 14 of 25 positions shown · non-contrast
Comparison: None.

CLINICAL DATA: Abdominal pain.  Elevated liver function tests.
Hypercholesterolemia and hypertension.

ABDOMINAL ULTRASOUND COMPLETE

[Series 1: us abdomen complete · 0.24mm/px · 14 of 66 slices shown]
[im 1/66]
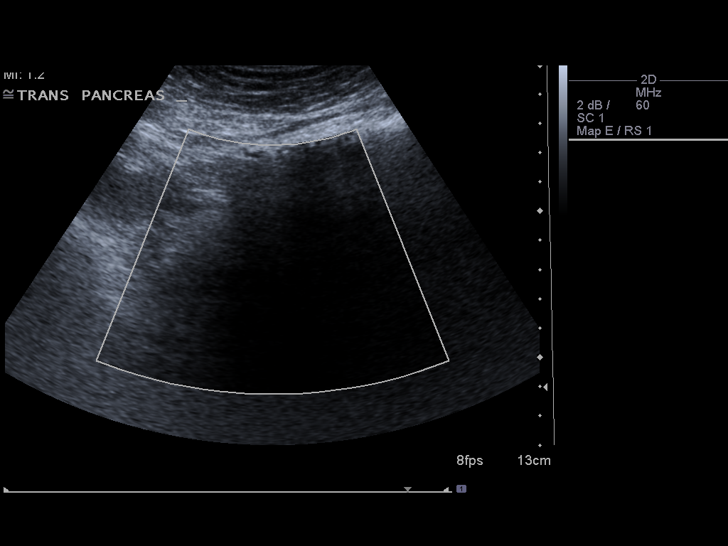
[im 6/66]
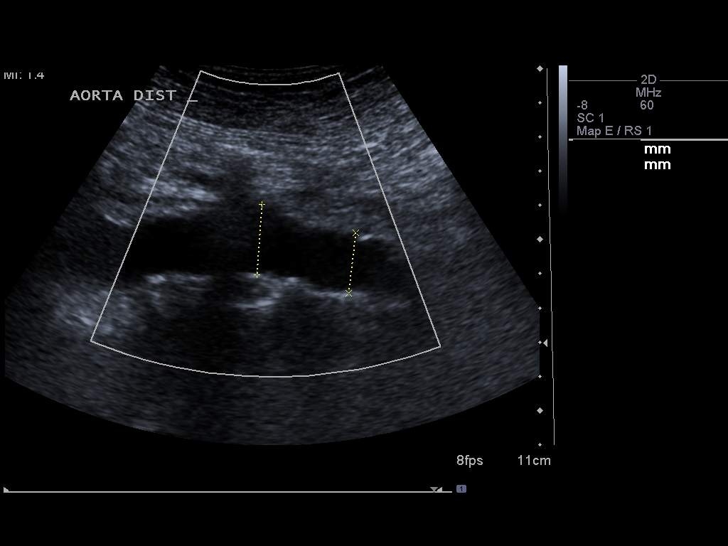
[im 11/66]
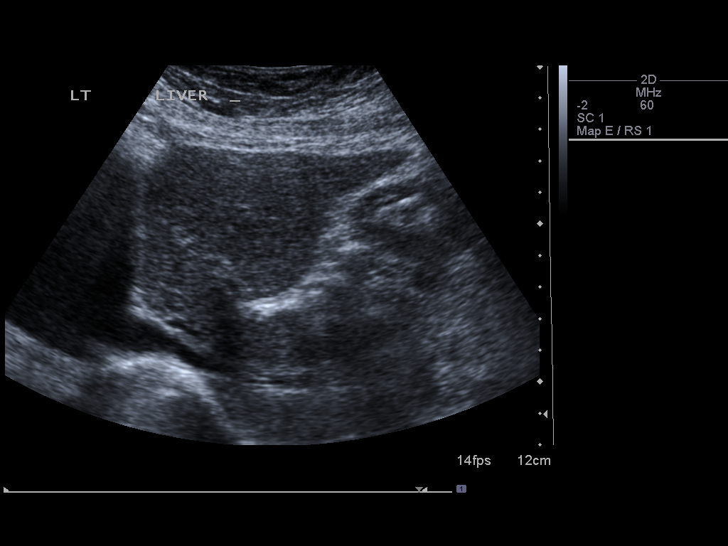
[im 17/66]
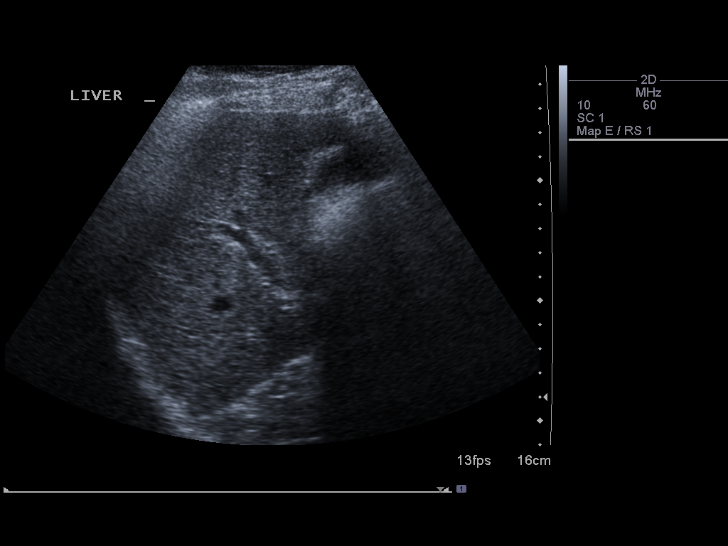
[im 22/66]
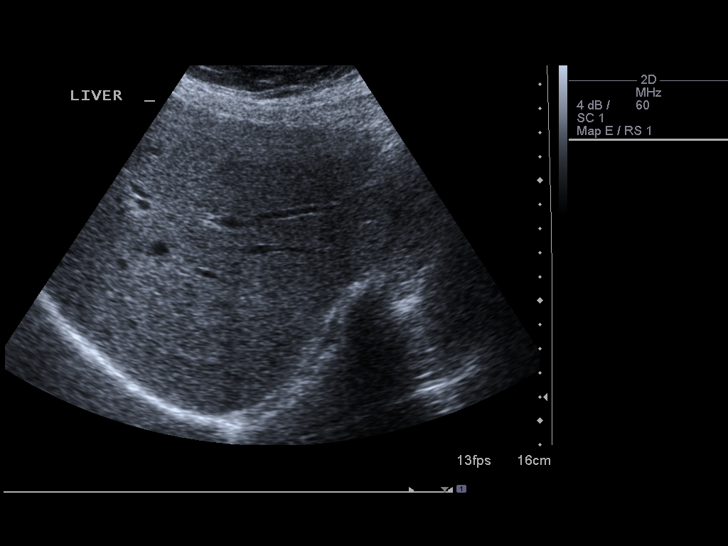
[im 25/66]
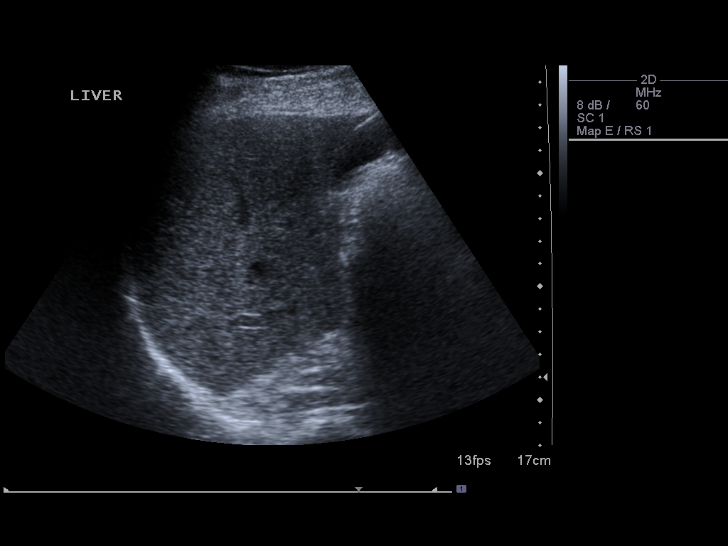
[im 30/66]
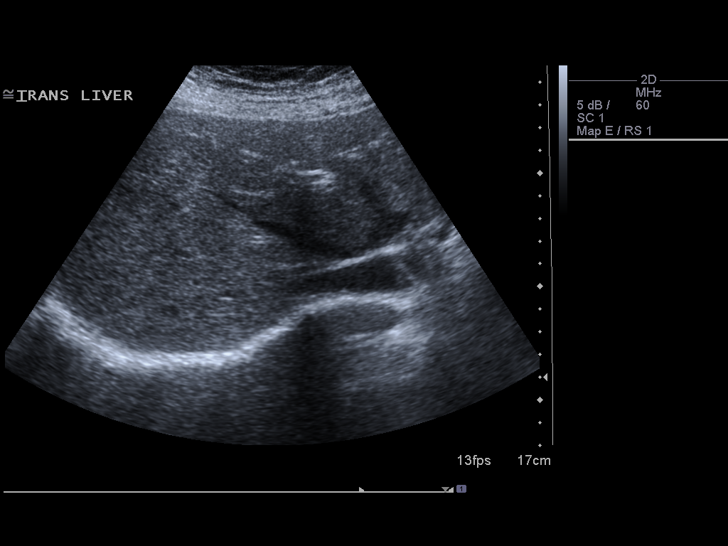
[im 36/66]
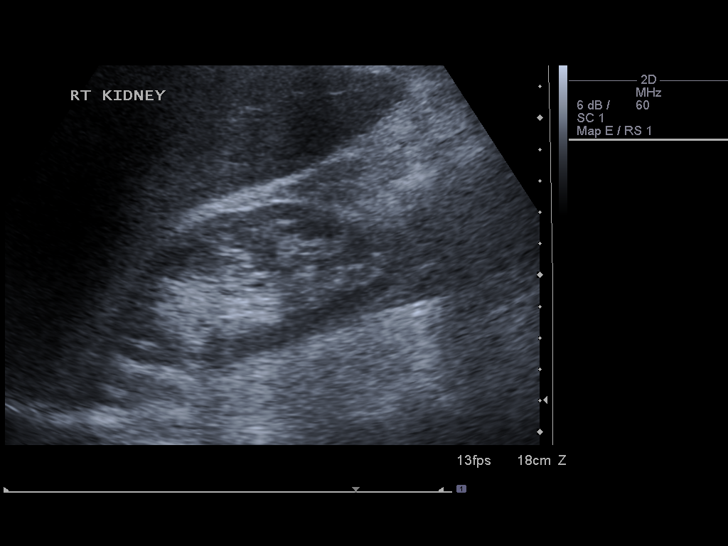
[im 41/66]
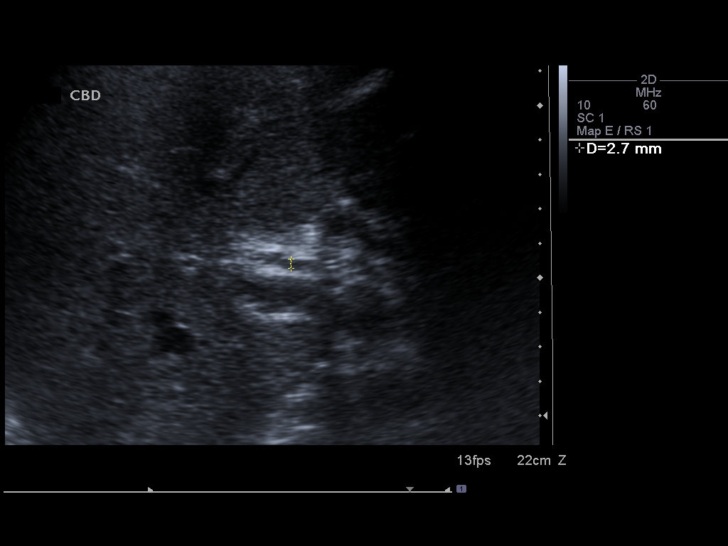
[im 44/66]
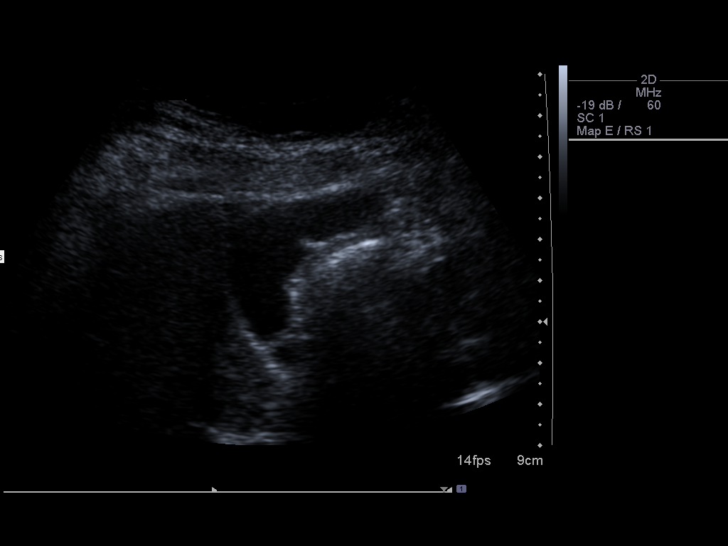
[im 49/66]
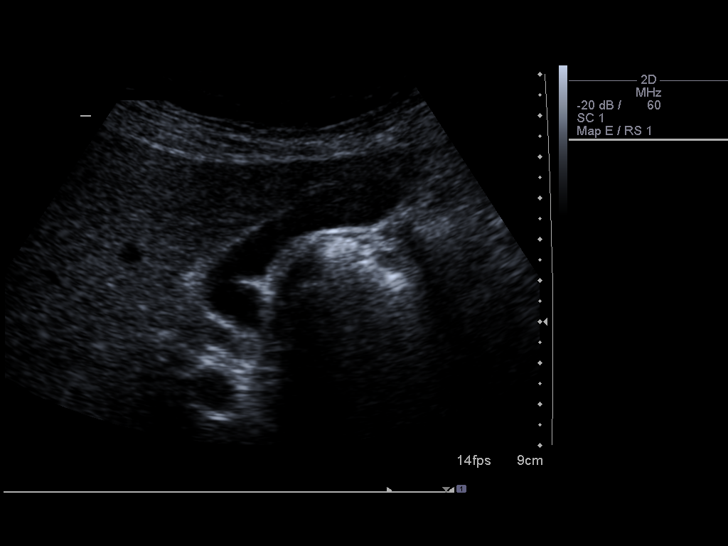
[im 55/66]
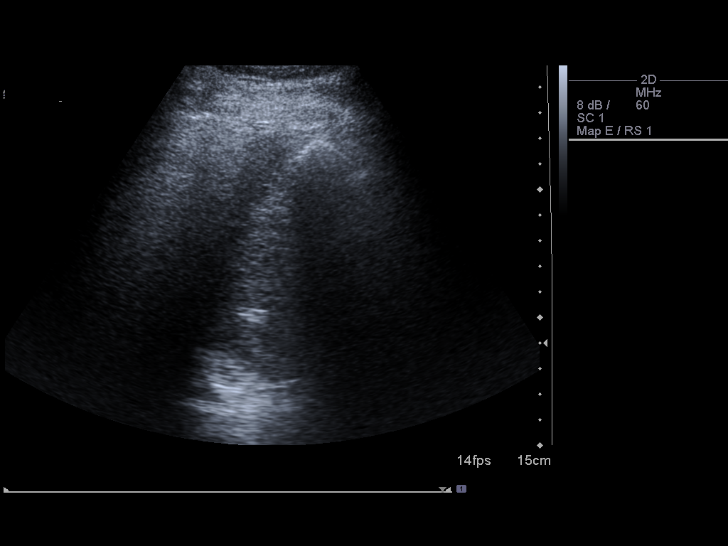
[im 60/66]
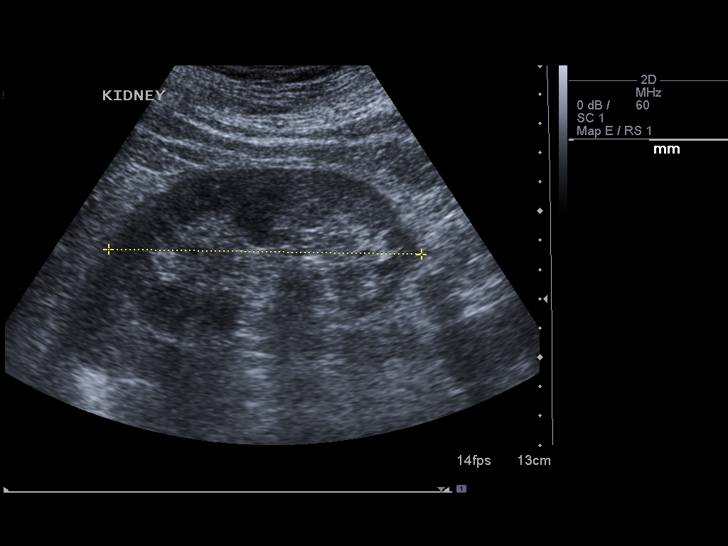
[im 66/66]
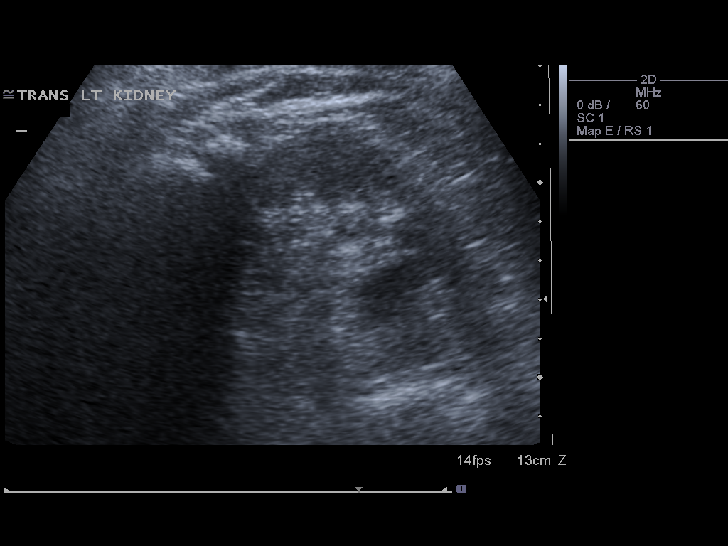

[14 of 25 positions shown; findings below may reference images not displayed]

FINDINGS: Gallbladder:  No evidence of gallstones or gallbladder wall
thickening.

Common Bile Duct:  Within normal limits in caliber. Measures 3 mm
in diameter.

Liver: No focal mass lesion identified.  Within normal limits in
parenchymal echogenicity.

IVC:  Appears normal.

Pancreas:  Not well visualized due to overlying bowel gas.

Spleen:  Within normal limits in size and echotexture.

Right kidney:  Normal in size and parenchymal echogenicity.  No
evidence of mass or hydronephrosis.

Left kidney:  Normal in size and parenchymal echogenicity.  No
evidence of mass or hydronephrosis.

Abdominal Aorta:  No aneurysm identified.
IMPRESSION: Negative abdominal ultrasound.

## 2011-04-04 ENCOUNTER — Other Ambulatory Visit: Payer: Self-pay | Admitting: Cardiology

## 2011-07-11 ENCOUNTER — Ambulatory Visit: Payer: Medicare Other | Admitting: Cardiology

## 2011-08-08 ENCOUNTER — Encounter: Payer: Self-pay | Admitting: Cardiology

## 2011-08-08 ENCOUNTER — Ambulatory Visit (INDEPENDENT_AMBULATORY_CARE_PROVIDER_SITE_OTHER): Payer: Medicare Other | Admitting: Cardiology

## 2011-08-08 VITALS — BP 132/70 | HR 63 | Ht 67.0 in | Wt 177.0 lb

## 2011-08-08 DIAGNOSIS — I10 Essential (primary) hypertension: Secondary | ICD-10-CM

## 2011-08-08 DIAGNOSIS — I2510 Atherosclerotic heart disease of native coronary artery without angina pectoris: Secondary | ICD-10-CM

## 2011-08-08 DIAGNOSIS — E782 Mixed hyperlipidemia: Secondary | ICD-10-CM

## 2011-08-08 NOTE — Assessment & Plan Note (Signed)
Stable. Continue secondary preventative therapy. Return the office a year.

## 2011-08-08 NOTE — Progress Notes (Signed)
HPI [name] comes in today for evaluation and management of  his coronary disease, history PCI, and cardiac risk factors.  His usual, he is very upbeat. He continues exercise on regular basis. His blood work is followed by Dr. Tanya Nones and  has been good.  He denies any angina or ischemic symptoms. He is very compliant with his meds.    Past Medical History  Diagnosis Date  . CAD (coronary artery disease)   . Hyperlipidemia   . HTN (hypertension)     Current Outpatient Prescriptions  Medication Sig Dispense Refill  . amLODipine-benazepril (LOTREL) 5-20 MG per capsule Take 1 capsule by mouth daily.        Marland Kitchen aspirin 325 MG EC tablet Take by mouth daily.        Marland Kitchen atorvastatin (LIPITOR) 40 MG tablet Take 40 mg by mouth daily.        . Calcium Carbonate-Vitamin D (CALCARB 600/D) 600-400 MG-UNIT per tablet Take by mouth daily.        . clopidogrel (PLAVIX) 75 MG tablet TAKE 1 TABLET BY MOUTH EVERY DAY  30 tablet  5  . Coenzyme Q10 (CO Q-10) 300 MG CAPS Take 100 mg by mouth daily.       . fish oil-omega-3 fatty acids 1000 MG capsule Take by mouth daily.        Marland Kitchen JALYN 0.5-0.4 MG CAPS Take 1 tablet by mouth daily.      . Multiple Vitamin (MULTIVITAMIN) capsule Take 1 capsule by mouth daily.        Marland Kitchen NITROSTAT 0.4 MG SL tablet ONE TABLET UNDER TONGUE EVERY 5 MINUTES AS NEEDED FOR CHEST PAIN---MAY REPEAT TIMES THREE  25 tablet  1    No Known Allergies  Family History  Problem Relation Age of Onset  . Coronary artery disease Father 66  . Coronary artery disease Brother   . Aortic aneurysm Brother     History   Social History  . Marital Status: Married    Spouse Name: N/A    Number of Children: N/A  . Years of Education: N/A   Occupational History  . Not on file.   Social History Main Topics  . Smoking status: Former Games developer  . Smokeless tobacco: Not on file   Comment: quit over 24yrs ago.  . Alcohol Use: No  . Drug Use: No  . Sexually Active: Not on file   Other  Topics Concern  . Not on file   Social History Narrative  . No narrative on file    ROS ALL NEGATIVE EXCEPT THOSE NOTED IN HPI  PE  General Appearance: well developed, well nourished in no acute distress HEENT: symmetrical face, PERRLA, good dentition  Neck: no JVD, thyromegaly, or adenopathy, trachea midline Chest: symmetric without deformity Cardiac: PMI non-displaced, RRR, normal S1, S2, no gallop or murmur Lung: clear to ausculation and percussion Vascular: all pulses full without bruits  Abdominal: nondistended, nontender, good bowel sounds, no HSM, no bruits Extremities: no cyanosis, clubbing or edema, no sign of DVT, no varicosities  Skin: normal color, no rashes Neuro: alert and oriented x 3, non-focal Pysch: normal affect  EKG Normal sinus rhythm, normal EKG BMET    Component Value Date/Time   NA 139 08/31/2007 1957   K 4.2 08/31/2007 1957   CL 105 08/31/2007 1957   CO2 28 08/11/2007 0849   GLUCOSE 104* 08/31/2007 1957   BUN 23 08/31/2007 1957   CREATININE 1.1 08/31/2007 1957   CALCIUM 9.2  08/11/2007 0849   GFRNONAA 78 08/11/2007 0849   GFRAA 94 08/11/2007 0849    Lipid Panel     Component Value Date/Time   CHOL 150 08/11/2007 0849   TRIG 66 08/11/2007 0849   HDL 49.8 08/11/2007 0849   CHOLHDL 3.0 CALC 08/11/2007 0849   VLDL 13 08/11/2007 0849   LDLCALC 87 08/11/2007 0849    CBC    Component Value Date/Time   WBC 5.7 08/31/2007 1953   RBC 4.33 08/31/2007 1953   HGB 13.9 08/31/2007 1957   HCT 41.0 08/31/2007 1957   PLT 224 08/31/2007 1953   MCV 95.8 08/31/2007 1953   MCHC 33.5 08/31/2007 1953   RDW 12.8 08/31/2007 1953   LYMPHSABS 1.3 08/31/2007 1953   MONOABS 0.6 08/31/2007 1953   EOSABS 0.6 08/31/2007 1953   BASOSABS 0.0 08/31/2007 1953

## 2011-08-08 NOTE — Patient Instructions (Addendum)
Your physician recommends that you continue on your current medications as directed. Please refer to the Current Medication list given to you today.  Your physician wants you to follow-up in: 1 year. You will receive a reminder letter in the mail two months in advance. If you don't receive a letter, please call our office to schedule the follow-up appointment.  

## 2011-09-21 ENCOUNTER — Other Ambulatory Visit: Payer: Self-pay | Admitting: Cardiology

## 2012-03-12 ENCOUNTER — Other Ambulatory Visit: Payer: Self-pay | Admitting: *Deleted

## 2012-03-12 MED ORDER — CLOPIDOGREL BISULFATE 75 MG PO TABS: 75.0000 mg | ORAL_TABLET | Freq: Every day | ORAL | Status: DC

## 2012-07-31 ENCOUNTER — Encounter: Payer: Self-pay | Admitting: Physician Assistant

## 2012-08-01 ENCOUNTER — Encounter: Payer: Self-pay | Admitting: Physician Assistant

## 2012-08-01 ENCOUNTER — Other Ambulatory Visit: Payer: Self-pay

## 2012-08-01 ENCOUNTER — Ambulatory Visit: Payer: Medicare Other | Admitting: Physician Assistant

## 2012-08-01 ENCOUNTER — Ambulatory Visit: Payer: Medicare Other | Admitting: Nurse Practitioner

## 2012-08-01 ENCOUNTER — Ambulatory Visit (INDEPENDENT_AMBULATORY_CARE_PROVIDER_SITE_OTHER): Payer: Medicare Other | Admitting: Physician Assistant

## 2012-08-01 VITALS — BP 142/82 | HR 80 | Ht 67.0 in | Wt 191.0 lb

## 2012-08-01 DIAGNOSIS — I2510 Atherosclerotic heart disease of native coronary artery without angina pectoris: Secondary | ICD-10-CM

## 2012-08-01 DIAGNOSIS — E782 Mixed hyperlipidemia: Secondary | ICD-10-CM

## 2012-08-01 DIAGNOSIS — I10 Essential (primary) hypertension: Secondary | ICD-10-CM

## 2012-08-01 MED ORDER — CLOPIDOGREL BISULFATE 75 MG PO TABS: 75.0000 mg | ORAL_TABLET | Freq: Every day | ORAL | Status: DC

## 2012-08-01 MED ORDER — NITROGLYCERIN 0.4 MG SL SUBL: SUBLINGUAL_TABLET | SUBLINGUAL | Status: DC

## 2012-08-01 NOTE — Patient Instructions (Addendum)
ONCE YOU FINISH YOUR BOTTLE OF ASPIRIN 325 MG YOU ARE TO START ON ASPIRIN 81 MG DAILY  PLEASE SCHEDULE AN GXT; PREFERABLY IN THE AFTERNOON PER PT REQUEST  Your physician wants you to follow-up in: 1 YR WITH DR. Excell Seltzer OR SCOTT WEAVER, PAC. You will receive a reminder letter in the mail two months in advance. If you don't receive a letter, please call our office to schedule the follow-up appointment.

## 2012-08-01 NOTE — Progress Notes (Signed)
[address] [place], [place]  [address] Phone: [phone] Fax:  [phone]  Date:  08/01/2012   ID:  [name], DOB 09-29-1933, MRN [id number]  PCP:  Leo Grosser, MD  Cardiologist:  Dr. Valera Castle     History of Present Illness: [name] is a 79 y.o. male who returns for follow up.  He has a hx of CAD, HTN, HL.  LHC 07/2006:  pLAD 95%, oDx 70%, dLAD 30%, proximal-mid CFX 25-30%, second branch of the PL branch of the CFX 30-40%, pRCA 60%, mRCA 40%, dRCA 50%, oPDA 75%, mPDA 70-75% (2 mm vessel), EF 55-60%. PCI: Cypher DES to the LAD.  Medical therapy recommended for remainder of his CAD.  Last seen by Dr. Valera Castle in 08/2011.    Since last seen, he is doing well. The patient denies chest pain, shortness of breath, syncope, orthopnea, PND or significant pedal edema. He walks 12 miles a day and is training for a 500 mile walk (over 42 days) in September in [place].  Labs (7/09):  K 4.2, Cr 1.1, ALT 15, LDL 87, Hgb 13.9   Wt Readings from Last 3 Encounters:  08/01/12 191 lb (86.637 kg)  08/08/11 177 lb (80.287 kg)  07/07/10 188 lb (85.276 kg)     Past Medical History  Diagnosis Date  . CAD (coronary artery disease)     s/p cypher DES to pLAD 6/08; normal LVF;  ETT-Myoview 2009: no ischemia   . Hyperlipidemia   . HTN (hypertension)     Current Outpatient Prescriptions  Medication Sig Dispense Refill  . amLODipine-benazepril (LOTREL) 5-20 MG per capsule Take 1 capsule by mouth daily.        Marland Kitchen aspirin 325 MG EC tablet Take by mouth daily.        . Calcium Carbonate-Vitamin D (CALCARB 600/D) 600-400 MG-UNIT per tablet Take by mouth daily.        . Coenzyme Q10 (CO Q-10) 300 MG CAPS Take 100 mg by mouth daily.       Marland Kitchen JALYN 0.5-0.4 MG CAPS Take 1 tablet by mouth daily.      . Multiple Vitamin (MULTIVITAMIN) capsule Take 1 capsule by mouth daily.        . simvastatin (ZOCOR) 40 MG tablet Take 40 mg by mouth at bedtime.       .  clopidogrel (PLAVIX) 75 MG tablet Take 1 tablet (75 mg total) by mouth daily.  30 tablet  5  . nitroGLYCERIN (NITROSTAT) 0.4 MG SL tablet ONE TABLET UNDER TONGUE EVERY 5 MINUTES AS NEEDED FOR CHEST PAIN---MAY REPEAT TIMES THREE  25 tablet  1   No current facility-administered medications for this visit.    Allergies:   No Known Allergies  Social History:  The patient  reports that he has quit smoking. He does not have any smokeless tobacco history on file. He reports that he does not drink alcohol or use illicit drugs.   ROS:  Please see the history of present illness.      All other systems reviewed and negative.   PHYSICAL EXAM: VS:  BP 142/82  Pulse 80  Ht 5\' 7"  (1.702 m)  Wt 191 lb (86.637 kg)  BMI 29.91 kg/m2 Well nourished, well developed, in no acute distress HEENT: normal Neck: no JVD Vascular:  No carotid bruits Cardiac:  normal S1, S2; RRR; no murmur Lungs:  clear to auscultation bilaterally, no wheezing, rhonchi or rales Abd: soft, nontender, no  hepatomegaly Ext: no edema Skin: warm and dry Neuro:  CNs 2-12 intact, no focal abnormalities noted  EKG:  NSR, HR 80, PVCs, no change from prior tracings     ASSESSMENT AND PLAN:  1. CAD:  Doing well.  No angina.  He has a 1st gen DES.  Remain on Plavix.  He can reduce ASA to 81 mg QD.  It has been 7 years since his last assessment for ischemia.  He will be going to [place] for a 500 mile walk over 42 days.  I think it would be best to proceed with a POET at this time to f/u on his CAD.  He is in agreement.   2. Hypertension:  Controlled.  Continue current therapy.  3. Hyperlipidemia:  Managed by PCP.  4. Disposition:  I will have him see Dr. Excell Seltzer in the future.  F/u with Dr. Tonny Bollman in 1 year.  Signed, Tereso Newcomer, PA-C  08/01/2012 9:06 AM

## 2012-08-20 ENCOUNTER — Ambulatory Visit (INDEPENDENT_AMBULATORY_CARE_PROVIDER_SITE_OTHER): Payer: Medicare Other | Admitting: Physician Assistant

## 2012-08-20 DIAGNOSIS — I2510 Atherosclerotic heart disease of native coronary artery without angina pectoris: Secondary | ICD-10-CM

## 2012-08-20 NOTE — Progress Notes (Signed)
Exercise Treadmill Test  Pre-Exercise Testing Evaluation Rhythm: normal sinus  Rate: 74     Test  Exercise Tolerance Test Ordering MD:  Tereso Newcomer, PA-C Interpreting MD: Tereso Newcomer, PA-C  Unique Test No: 1  Treadmill:  1  Indication for ETT: known ASHD  Contraindication to ETT: No   Stress Modality: exercise - treadmill  Cardiac Imaging Performed: non   Protocol: standard Bruce - maximal  Max BP:  193/77  Max MPHR (bpm):  143 85% MPR (bpm):  122  MPHR obtained (bpm):  142 % MPHR obtained:  99  Reached 85% MPHR (min:sec):  3:05 Total Exercise Time (min-sec):  6:00  Workload in METS:  7.0 Borg Scale: 13  Reason ETT Terminated:  desired heart rate attained    ST Segment Analysis At Rest: normal ST segments - no evidence of significant ST depression With Exercise: non-specific ST changes  Other Information Arrhythmia:  Frequent PVCs pre and post exercise - patient took benadryl before coming today Angina during ETT:  absent (0) Quality of ETT:  diagnostic  ETT Interpretation:  normal - no evidence of ischemia by ST analysis  Comments: Good exercise tolerance. No chest pain. Normal BP response to exercise. No ST-T changes to suggest ischemia.  Frequent PVCs pre and post exercise.  No exercise induced ventricular arrhythmias.    Recommendations: F/u with Dr. Tonny Bollman as planned. Signed,  Tereso Newcomer, PA-C   08/20/2012 3:13 PM

## 2012-09-28 ENCOUNTER — Ambulatory Visit (INDEPENDENT_AMBULATORY_CARE_PROVIDER_SITE_OTHER): Payer: Medicare Other | Admitting: Family Medicine

## 2012-09-28 VITALS — BP 165/88 | HR 77 | Temp 98.2°F | Resp 18 | Ht 67.0 in | Wt 184.0 lb

## 2012-09-28 DIAGNOSIS — R319 Hematuria, unspecified: Secondary | ICD-10-CM

## 2012-09-28 LAB — POCT CBC
Granulocyte percent: 77.8 %G (ref 37–80)
HCT, POC: 43.8 % (ref 43.5–53.7)
Hemoglobin: 13.7 g/dL — AB (ref 14.1–18.1)
Lymph, poc: 1.1 (ref 0.6–3.4)
MCH, POC: 31.3 pg — AB (ref 27–31.2)
MCHC: 31.3 g/dL — AB (ref 31.8–35.4)
MCV: 100 fL — AB (ref 80–97)
MID (cbc): 0.5 (ref 0–0.9)
MPV: 8.2 fL (ref 0–99.8)
POC Granulocyte: 5.8 (ref 2–6.9)
POC LYMPH PERCENT: 15 %L (ref 10–50)
POC MID %: 7.2 %M (ref 0–12)
Platelet Count, POC: 269 10*3/uL (ref 142–424)
RBC: 4.38 M/uL — AB (ref 4.69–6.13)
RDW, POC: 13.1 %
WBC: 7.4 10*3/uL (ref 4.6–10.2)

## 2012-09-28 LAB — POCT UA - MICROSCOPIC ONLY
Casts, Ur, LPF, POC: NEGATIVE
Crystals, Ur, HPF, POC: NEGATIVE
Mucus, UA: POSITIVE
Yeast, UA: NEGATIVE

## 2012-09-28 LAB — POCT URINALYSIS DIPSTICK
Bilirubin, UA: NEGATIVE
Glucose, UA: NEGATIVE
Leukocytes, UA: NEGATIVE
Nitrite, UA: NEGATIVE
Protein, UA: 30
Spec Grav, UA: 1.025
Urobilinogen, UA: 0.2
pH, UA: 6

## 2012-09-28 LAB — COMPREHENSIVE METABOLIC PANEL
ALT: 17 U/L (ref 0–53)
AST: 27 U/L (ref 0–37)
Albumin: 4.3 g/dL (ref 3.5–5.2)
Alkaline Phosphatase: 53 U/L (ref 39–117)
BUN: 26 mg/dL — ABNORMAL HIGH (ref 6–23)
CO2: 25 mEq/L (ref 19–32)
Calcium: 9.2 mg/dL (ref 8.4–10.5)
Chloride: 106 mEq/L (ref 96–112)
Creat: 1.22 mg/dL (ref 0.50–1.35)
Glucose, Bld: 93 mg/dL (ref 70–99)
Potassium: 4.6 mEq/L (ref 3.5–5.3)
Sodium: 139 mEq/L (ref 135–145)
Total Bilirubin: 0.6 mg/dL (ref 0.3–1.2)
Total Protein: 6.5 g/dL (ref 6.0–8.3)

## 2012-09-28 LAB — PSA: PSA: 0.67 ng/mL (ref <=4.00)

## 2012-09-28 NOTE — Progress Notes (Signed)
Subjective:    Patient ID: [name], male    DOB: December 27, 1933, 79 y.o.   MRN: [id number]  HPI This 79 y.o. male presents for evaluation of hematuria.  Hematuria occurred yesterday.  Similar episode two days ago; small speck of blood in underwear.  No pain anywhere; feels great.  No dysuria, freqeuncy, nocturia.  No n/v; no abdominal pain.  Energy level good.  Urinary stream good; no straining to urinate; feels like emptying bladder.  No flank pain.  Has been walking 12-16 miles per day; training intensely for upcoming walk.  Feels hematuria due to over-exertion.  No history of kidney stones.  No other sites of bleeding.      PCP: Tanya Nones at [facility].    Review of Systems  Constitutional: Negative for fever, chills, diaphoresis and fatigue.  Gastrointestinal: Negative for nausea, vomiting and abdominal pain.  Genitourinary: Positive for hematuria. Negative for dysuria, urgency, frequency, flank pain, decreased urine volume, discharge, penile swelling, genital sores, penile pain and testicular pain.  Musculoskeletal: Negative for back pain.  Hematological: Does not bruise/bleed easily.    Past Medical History  Diagnosis Date  . CAD (coronary artery disease)     s/p cypher DES to pLAD 6/08; normal LVF;  ETT-Myoview 2009: no ischemia   . Hyperlipidemia   . HTN (hypertension)   . Allergy   . Myocardial infarction     Past Surgical History  Procedure Laterality Date  . Exploratory laparotomy      age 9  . Appendectomy    . Cardiac catheterization  07/30/2006    CORONARY ANGIOPLASTY WITH STENT PLACEMENT    Prior to Admission medications   Medication Sig Start Date End Date Taking? Authorizing Provider  amLODipine-benazepril (LOTREL) 5-20 MG per capsule Take 1 capsule by mouth daily.     Yes Historical Provider, MD  aspirin 325 MG EC tablet Take by mouth daily.     Yes Historical Provider, MD  Calcium Carbonate-Vitamin D (CALCARB 600/D) 600-400 MG-UNIT per tablet Take by  mouth daily.     Yes Historical Provider, MD  clopidogrel (PLAVIX) 75 MG tablet Take 1 tablet (75 mg total) by mouth daily. 08/01/12  Yes Gaylord Shih, MD  Coenzyme Q10 (CO Q-10) 300 MG CAPS Take 100 mg by mouth daily.    Yes Historical Provider, MD  JALYN 0.5-0.4 MG CAPS Take 1 tablet by mouth daily. 07/25/11  Yes Historical Provider, MD  Multiple Vitamin (MULTIVITAMIN) capsule Take 1 capsule by mouth daily.     Yes Historical Provider, MD  nitroGLYCERIN (NITROSTAT) 0.4 MG SL tablet ONE TABLET UNDER TONGUE EVERY 5 MINUTES AS NEEDED FOR CHEST PAIN---MAY REPEAT TIMES THREE 08/01/12  Yes Gaylord Shih, MD  simvastatin (ZOCOR) 40 MG tablet Take 40 mg by mouth at bedtime.  07/26/12  Yes Historical Provider, MD    No Known Allergies  History   Social History  . Marital Status: Single    Spouse Name: N/A    Number of Children: N/A  . Years of Education: N/A   Occupational History  . Not on file.   Social History Main Topics  . Smoking status: Former Games developer  . Smokeless tobacco: Not on file     Comment: quit over 56yrs ago.  . Alcohol Use: No  . Drug Use: No  . Sexual Activity: Not on file   Other Topics Concern  . Not on file   Social History Narrative  . No narrative on file  Family History  Problem Relation Age of Onset  . Coronary artery disease Father 38  . Coronary artery disease Brother   . Aortic aneurysm Brother        Objective:   Physical Exam  Nursing note and vitals reviewed. Constitutional: He appears well-developed and well-nourished. No distress.  Cardiovascular: Normal rate and regular rhythm.   No murmur heard. Pulmonary/Chest: Effort normal and breath sounds normal.  Abdominal: Soft. Bowel sounds are normal. He exhibits no distension and no mass. There is no tenderness. There is no rebound and no guarding. Hernia confirmed negative in the right inguinal area and confirmed negative in the left inguinal area.  Genitourinary: Rectum normal, prostate  normal, testes normal and penis normal. Prostate is not tender.  Skin: He is not diaphoretic.   Results for orders placed in visit on 09/28/12  POCT UA - MICROSCOPIC ONLY      Result Value Range   WBC, Ur, HPF, POC 1-3     RBC, urine, microscopic 4-5     Bacteria, U Microscopic 1+     Mucus, UA pos     Epithelial cells, urine per micros 3-7     Crystals, Ur, HPF, POC neg     Casts, Ur, LPF, POC neg     Yeast, UA neg    POCT URINALYSIS DIPSTICK      Result Value Range   Color, UA yellow     Clarity, UA clear     Glucose, UA neg     Bilirubin, UA neg     Ketones, UA trace     Spec Grav, UA 1.025     Blood, UA small     pH, UA 6.0     Protein, UA 30     Urobilinogen, UA 0.2     Nitrite, UA neg     Leukocytes, UA Negative    POCT CBC      Result Value Range   WBC 7.4  4.6 - 10.2 K/uL   Lymph, poc 1.1  0.6 - 3.4   POC LYMPH PERCENT 15.0  10 - 50 %L   MID (cbc) 0.5  0 - 0.9   POC MID % 7.2  0 - 12 %M   POC Granulocyte 5.8  2 - 6.9   Granulocyte percent 77.8  37 - 80 %G   RBC 4.38 (*) 4.69 - 6.13 M/uL   Hemoglobin 13.7 (*) 14.1 - 18.1 g/dL   HCT, POC 16.1  09.6 - 53.7 %   MCV 100.0 (*) 80 - 97 fL   MCH, POC 31.3 (*) 27 - 31.2 pg   MCHC 31.3 (*) 31.8 - 35.4 g/dL   RDW, POC 04.5     Platelet Count, POC 269  142 - 424 K/uL   MPV 8.2  0 - 99.8 fL       Assessment & Plan:  Hematuria - Plan: POCT UA - Microscopic Only, POCT urinalysis dipstick, POCT CBC, Comprehensive metabolic panel, Urine culture, PSA   1. Hematuria;  New.  Otherwise asymptomatic; history significant only for extensive exercise in the past few weeks. Obtain Urine culture, PSA, cmet.  Refer to urology but desires to wait until after 12/05/12 if gross hematuria does not recur.  Leaving the country in two weeks and does not desire immediate evaluation.  Agreeable to immediate evaluation if gross hematuria recurrent.  Recommend aggressive hydration with recent intensive exercise program.

## 2012-09-29 LAB — URINE CULTURE
Colony Count: NO GROWTH
Organism ID, Bacteria: NO GROWTH

## 2012-10-01 ENCOUNTER — Telehealth: Payer: Self-pay | Admitting: Radiology

## 2012-10-01 NOTE — Telephone Encounter (Signed)
Patient advised of labs. Advised BUN slightly elevated, may be from dehydration, he is encouraged to increase his fluid intake and he may want to have this rechecked with his PCP in the next few weeks to make sure it has returned to normal. He is advised also urine culture negative. PSA normal He is feeling much much better, and will advise if he needs anything further. He thanks you for your help and states he appreciates all we did for him. To you FYI

## 2012-10-13 ENCOUNTER — Ambulatory Visit (INDEPENDENT_AMBULATORY_CARE_PROVIDER_SITE_OTHER): Payer: Medicare Other | Admitting: Family Medicine

## 2012-10-13 ENCOUNTER — Encounter: Payer: Self-pay | Admitting: Family Medicine

## 2012-10-13 VITALS — BP 140/74 | HR 78 | Temp 98.3°F | Resp 18 | Wt 185.0 lb

## 2012-10-13 DIAGNOSIS — R310 Gross hematuria: Secondary | ICD-10-CM

## 2012-10-13 NOTE — Progress Notes (Signed)
Subjective:    Patient ID: [name], male    DOB: [birth year], 79 y.o.   MRN: [id number]  HPI Patient is a very pleasant 79 year old Caucasian male who had an episode of gross hematuria on August 24. He went to urgent medical care where he had a urinalysis that was significant for 4 red blood cells per high power field and trace hemoglobin in his urinalysis.  One week later he also had another episode of gross hematuria. Both episodes are painless. He has faint blood and hematuria at the beginning of his stream of urine but then clears as he voids.  He denies dysuria urgency frequency or aspermia. A urine culture at urgent care was negative for infection. He also denies low back pain nausea or vomiting.  Past Medical History  Diagnosis Date  . CAD (coronary artery disease)     s/p cypher DES to pLAD 6/08; normal LVF;  ETT-Myoview 2009: no ischemia   . Hyperlipidemia   . HTN (hypertension)   . Allergy   . Myocardial infarction    Current Outpatient Prescriptions on File Prior to Visit  Medication Sig Dispense Refill  . amLODipine-benazepril (LOTREL) 5-20 MG per capsule Take 1 capsule by mouth daily.        Marland Kitchen aspirin 325 MG EC tablet Take by mouth daily.        . Calcium Carbonate-Vitamin D (CALCARB 600/D) 600-400 MG-UNIT per tablet Take by mouth daily.        . clopidogrel (PLAVIX) 75 MG tablet Take 1 tablet (75 mg total) by mouth daily.  30 tablet  5  . Coenzyme Q10 (CO Q-10) 300 MG CAPS Take 100 mg by mouth daily.       Marland Kitchen JALYN 0.5-0.4 MG CAPS Take 1 tablet by mouth daily.      . Multiple Vitamin (MULTIVITAMIN) capsule Take 1 capsule by mouth daily.        . nitroGLYCERIN (NITROSTAT) 0.4 MG SL tablet ONE TABLET UNDER TONGUE EVERY 5 MINUTES AS NEEDED FOR CHEST PAIN---MAY REPEAT TIMES THREE  25 tablet  1  . simvastatin (ZOCOR) 40 MG tablet Take 40 mg by mouth at bedtime.        No current facility-administered medications on file prior to visit.   No Known Allergies History    Social History  . Marital Status: Single    Spouse Name: N/A    Number of Children: N/A  . Years of Education: N/A   Occupational History  . Not on file.   Social History Main Topics  . Smoking status: Former Games developer  . Smokeless tobacco: Not on file     Comment: quit over 8yrs ago.  . Alcohol Use: No  . Drug Use: No  . Sexual Activity: Not on file   Other Topics Concern  . Not on file   Social History Narrative  . No narrative on file      Review of Systems  All other systems reviewed and are negative.       Objective:   Physical Exam  Vitals reviewed. Cardiovascular: Normal rate, regular rhythm, normal heart sounds and intact distal pulses.   No murmur heard. Pulmonary/Chest: Effort normal and breath sounds normal. No respiratory distress. He has no wheezes. He has no rales.  Abdominal: Soft. Bowel sounds are normal. He exhibits no distension. There is no tenderness. There is no rebound and no guarding.   no CVA tenderness.        Assessment & Plan:  1. Gross hematuria Patient has an appointment to see urologist already made by the urgent care. I will arrange a CT scan of the abdomen and pelvis with and without contrast to evaluate for other causes of gross hematuria prior to his cystoscopy with urology. Given his age, I do believe we need to rule out urinary tract malignancy. The CT scan we'll evaluate for renal cysts, renal masses. Cystoscopy will evaluate for bladder tumors. - CT Abdomen Pelvis W Contrast; Future - CT Abdomen Pelvis Wo Contrast; Future

## 2012-10-14 ENCOUNTER — Other Ambulatory Visit: Payer: Self-pay | Admitting: Family Medicine

## 2012-10-14 DIAGNOSIS — R310 Gross hematuria: Secondary | ICD-10-CM

## 2012-10-15 ENCOUNTER — Ambulatory Visit
Admission: RE | Admit: 2012-10-15 | Discharge: 2012-10-15 | Disposition: A | Discharge status: Home / Self Care | Payer: Medicare Other | Source: Ambulatory Visit | Attending: Family Medicine | Admitting: Family Medicine

## 2012-10-15 DIAGNOSIS — R310 Gross hematuria: Secondary | ICD-10-CM

## 2012-10-15 IMAGING — CT CT ABD-PEL WO/W CM
3 of 10 series · 12 of 46 positions shown, 18 images · IV contrast (READICAT/WATER & [ID] OMNI 300)
Comparison: [DATE]

CLINICAL DATA: Intermittent gross hematuria.

CT ABDOMEN AND PELVIS WITHOUT AND WITH CONTRAST
TECHNIQUE: Multidetector CT imaging of the abdomen and pelvis was
performed without contrast material in one or both body regions,
followed by contrast material(s) and further sections in one or
both body regions.
Contrast: 125mL OMNIPAQUE IOHEXOL 300 MG/ML  SOLN

[Series 5: hematuria with >45 · axial · 0.82mm/px · z∈[-362,+3]mm · 8 of 94 slices shown, 13 images]
[im 11/94  soft-tissue]
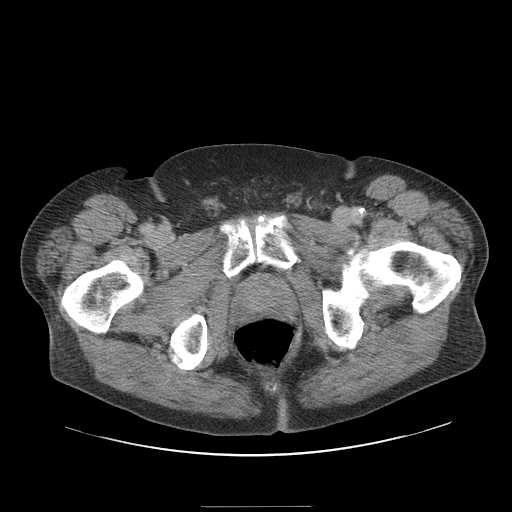
[im 11/94  bone]
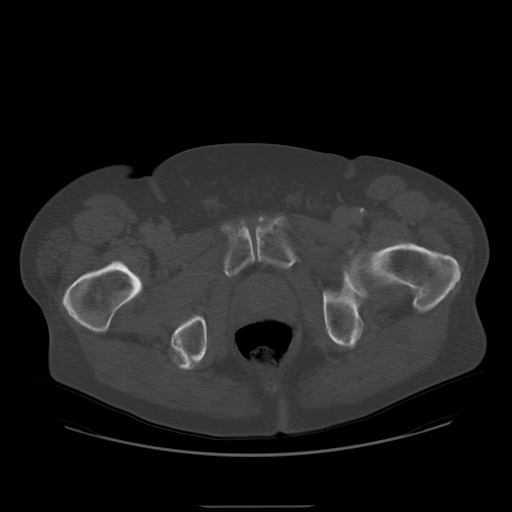
[im 21/94  soft-tissue]
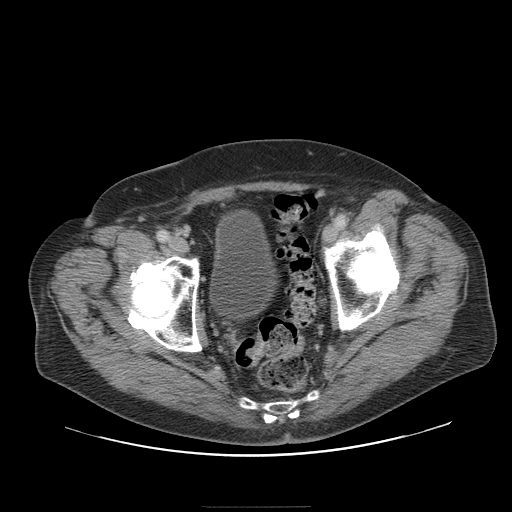
[im 32/94  soft-tissue]
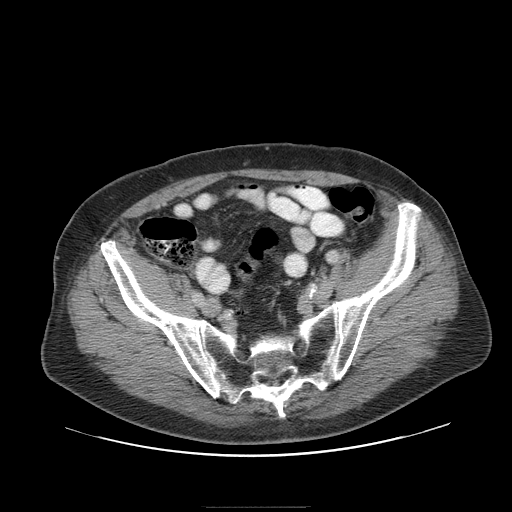
[im 42/94  soft-tissue]
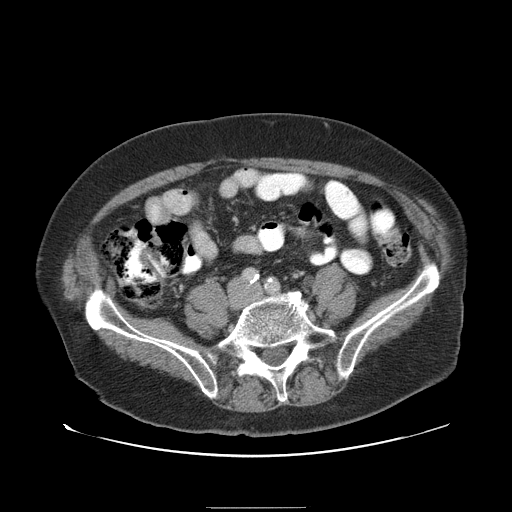
[im 52/94  soft-tissue]
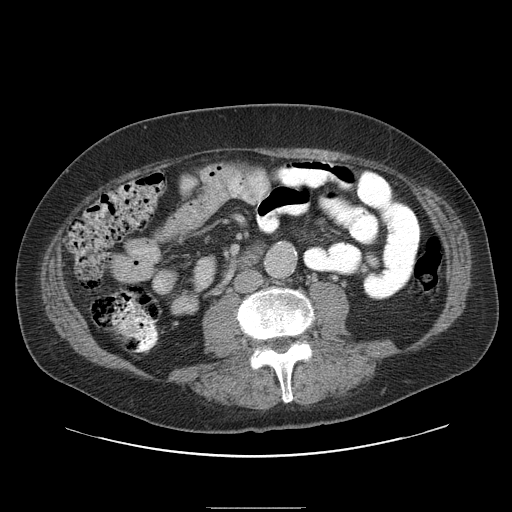
[im 52/94  lung]
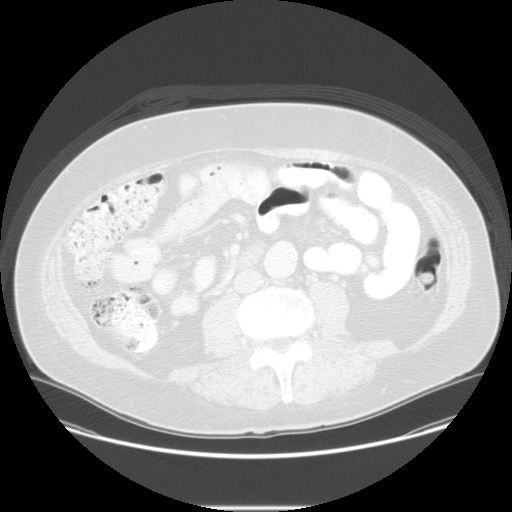
[im 63/94  soft-tissue]
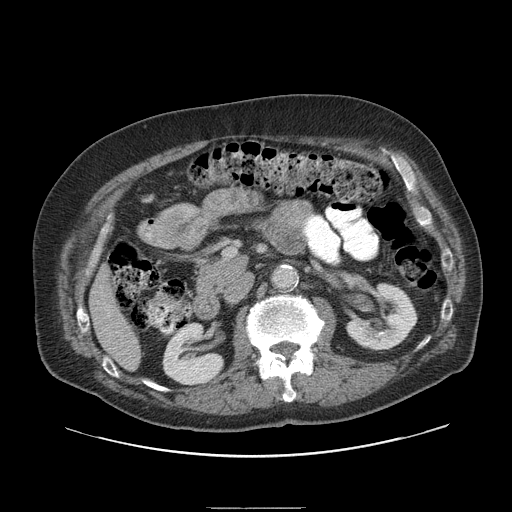
[im 63/94  lung]
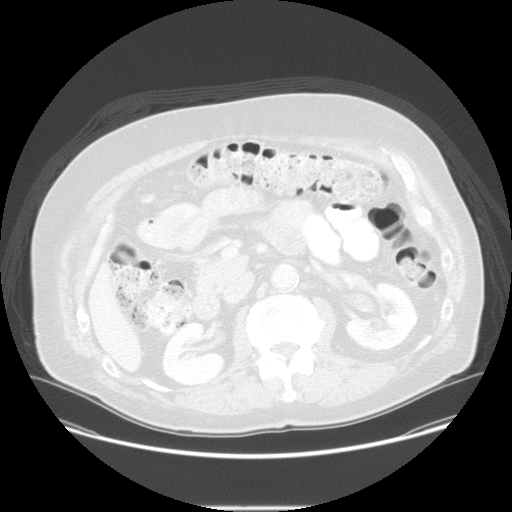
[im 73/94  soft-tissue]
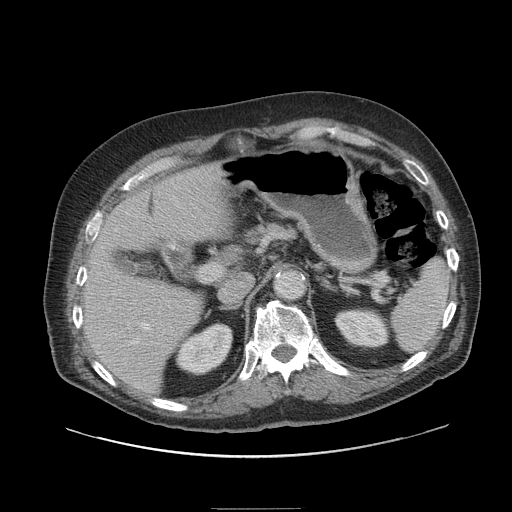
[im 73/94  lung]
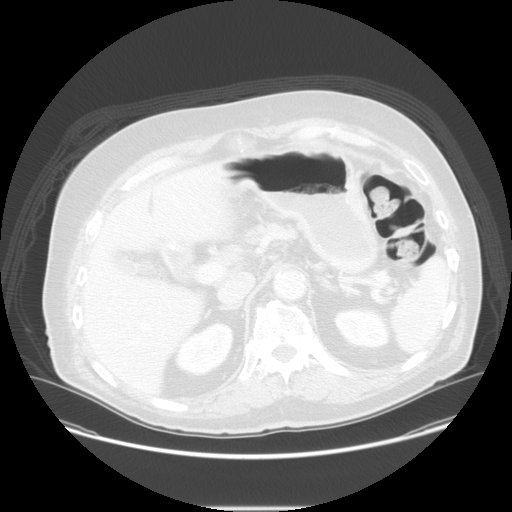
[im 83/94  soft-tissue]
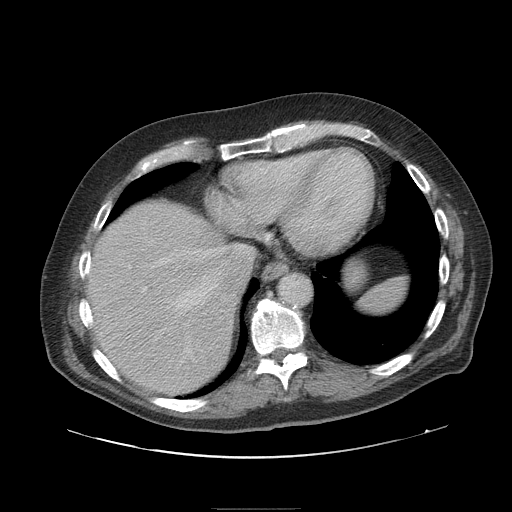
[im 83/94  lung]
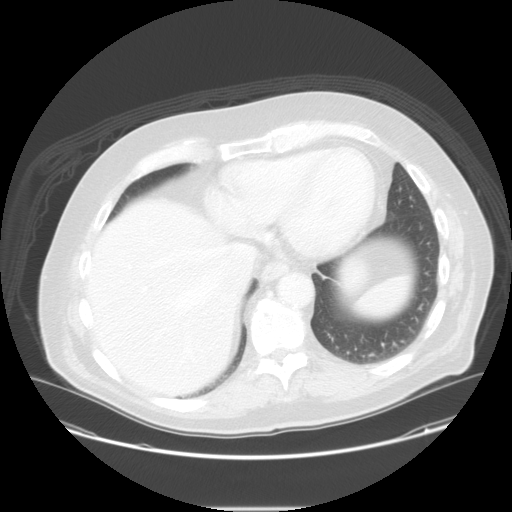

[Series 9: hematuria prone 10 min delay · axial · delayed · 0.82mm/px · z∈[-338,-228]mm · 3 of 97 slices shown]
[im 11/97  soft-tissue]
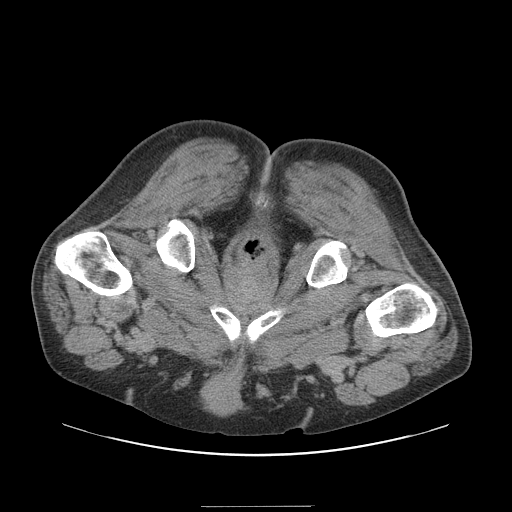
[im 22/97  soft-tissue]
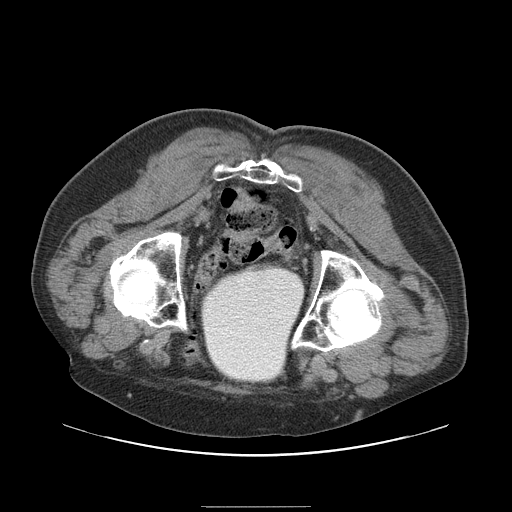
[im 33/97  soft-tissue]
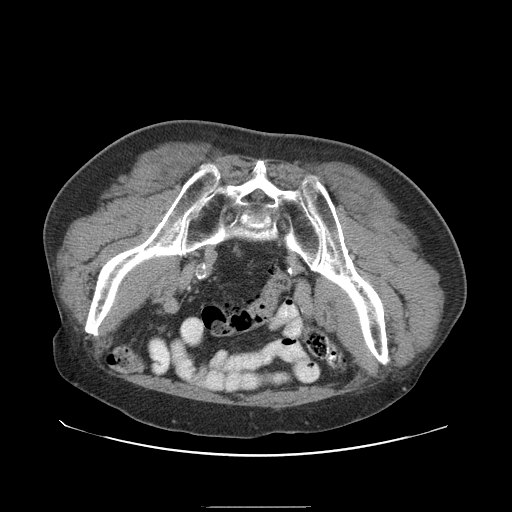

[Series 300: cor w/o · coronal · non-contrast · 0.93mm/px · 1 of 127 slices shown, 2 images]
[im 64/127  soft-tissue]
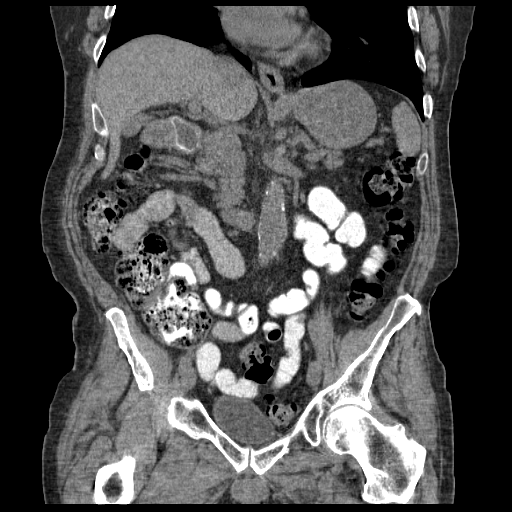
[im 64/127  bone]
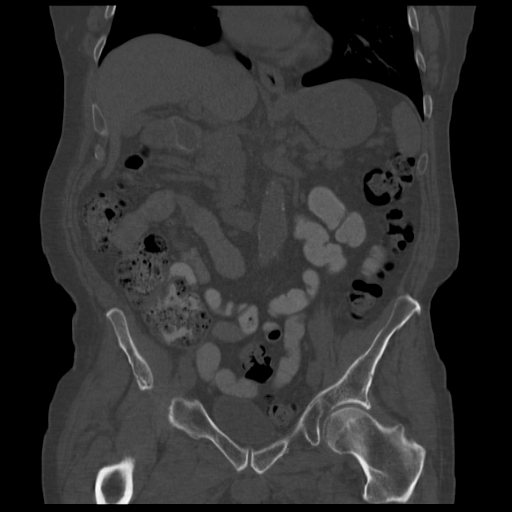

[12 of 46 positions shown; findings below may reference images not displayed]

FINDINGS: Several tiny right middle lobe nodular densities are
unchanged in the interval, consistent with benign disease.

Precontrast imaging shows no renal stones on either side.  No
evidence for ureteral stones although enteric contrast limits
assessment of the right ureter.  No bladder stones.

Imaging after IV contrast administration shows no mass lesion in
the right kidney.  8 mm low density lesion in the left kidney is
nonspecific, but likely represents a cortical cyst.  Tiny left
central sinus cysts are associated.  Delayed imaging shows no
filling defect within the intrarenal collecting systems or within
either renal pelvis.  The proximal portion of each ureter is
opacified and unremarkable.  There is mild focal dilatation of the
right ureter just proximal to the common iliac artery.  Neither
distal ureter is well evaluated.  Bladder lumen as well opacified
and no bladder wall abnormality is evident.

Prostate gland is enlarged generates mass effect on the bladder
base.

No focal abnormalities seen in the liver or spleen.  The stomach,
duodenum, pancreas, gallbladder, and adrenal glands are
unremarkable.

Abdominal aorta measures up to 3.1 cm in diameter, consistent with
mild aneurysm.  There is no free fluid or lymphadenopathy in the
abdomen.

Imaging through the pelvis shows no free intraperitoneal fluid.  No
pelvic sidewall lymphadenopathy. There is no evidence for colonic
diverticulitis.  The terminal ileum is normal. The appendix is not
visualized, but there is no edema or inflammation in the region of
the cecum.

Densely calcified lymph nodes in the ileocolic mesentery are
stable.

Bone windows reveal no worrisome lytic or sclerotic osseous
lesions.
IMPRESSION: Prostatomegaly.  Otherwise no urinary abnormality explain the
patient's history of hematuria.  Distal ureters are not well
evaluated on this study secondary to incomplete opacification..

8 mm probable left renal cyst with tiny left central sinus cysts
associated.

Abdominal aortic aneurysm measuring 3.1 cm in diameter.

## 2012-10-15 MED ORDER — IOHEXOL 300 MG/ML  SOLN
125.0000 mL | Freq: Once | INTRAMUSCULAR | Status: AC | PRN
  Administered 2012-10-15: 125 mL via INTRAVENOUS

## 2012-10-16 ENCOUNTER — Other Ambulatory Visit: Payer: Self-pay | Admitting: Family Medicine

## 2012-10-16 ENCOUNTER — Telehealth: Payer: Self-pay | Admitting: Family Medicine

## 2012-10-16 MED ORDER — SILDENAFIL CITRATE 100 MG PO TABS: 100.0000 mg | ORAL_TABLET | Freq: Every day | ORAL | Status: DC | PRN

## 2012-10-16 NOTE — Telephone Encounter (Signed)
Pt said that the samples that you gave him the other day worked (would not tell me the name) and he would like it sent to the pharmacy.

## 2012-10-16 NOTE — Telephone Encounter (Signed)
Rx Refilled  

## 2012-10-16 NOTE — Telephone Encounter (Signed)
Medication refilled per protocol. 

## 2012-10-16 NOTE — Telephone Encounter (Signed)
Please callout viagra 100 mg poqday prn #10 RF = 11

## 2012-11-03 ENCOUNTER — Ambulatory Visit (INDEPENDENT_AMBULATORY_CARE_PROVIDER_SITE_OTHER): Payer: Medicare Other | Admitting: Family Medicine

## 2012-11-03 ENCOUNTER — Encounter: Payer: Self-pay | Admitting: Family Medicine

## 2012-11-03 VITALS — BP 130/68 | HR 82 | Temp 97.2°F | Resp 18 | Wt 182.0 lb

## 2012-11-03 DIAGNOSIS — R0781 Pleurodynia: Secondary | ICD-10-CM

## 2012-11-03 DIAGNOSIS — W19XXXA Unspecified fall, initial encounter: Secondary | ICD-10-CM

## 2012-11-03 DIAGNOSIS — T148XXA Other injury of unspecified body region, initial encounter: Secondary | ICD-10-CM

## 2012-11-03 DIAGNOSIS — R079 Chest pain, unspecified: Secondary | ICD-10-CM

## 2012-11-03 DIAGNOSIS — J069 Acute upper respiratory infection, unspecified: Secondary | ICD-10-CM

## 2012-11-03 MED ORDER — IBUPROFEN 600 MG PO TABS: 600.0000 mg | ORAL_TABLET | Freq: Four times a day (QID) | ORAL | Status: DC | PRN

## 2012-11-03 MED ORDER — AZITHROMYCIN 250 MG PO TABS: ORAL_TABLET | ORAL | Status: DC

## 2012-11-03 NOTE — Patient Instructions (Addendum)
STart antibiotics  Take the ibuprofen as needed Call if pain does not improve, xray would be needed Push fluids We will call with lab results

## 2012-11-04 ENCOUNTER — Encounter: Payer: Self-pay | Admitting: Family Medicine

## 2012-11-04 DIAGNOSIS — W19XXXA Unspecified fall, initial encounter: Secondary | ICD-10-CM | POA: Insufficient documentation

## 2012-11-04 DIAGNOSIS — R0781 Pleurodynia: Secondary | ICD-10-CM | POA: Insufficient documentation

## 2012-11-04 DIAGNOSIS — J069 Acute upper respiratory infection, unspecified: Secondary | ICD-10-CM | POA: Insufficient documentation

## 2012-11-04 DIAGNOSIS — T148XXA Other injury of unspecified body region, initial encounter: Secondary | ICD-10-CM | POA: Insufficient documentation

## 2012-11-04 HISTORY — DX: Acute upper respiratory infection, unspecified: J06.9

## 2012-11-04 HISTORY — DX: Other injury of unspecified body region, initial encounter: T14.8XXA

## 2012-11-04 LAB — COMPREHENSIVE METABOLIC PANEL
ALT: 46 U/L (ref 0–53)
AST: 35 U/L (ref 0–37)
Albumin: 3.7 g/dL (ref 3.5–5.2)
Alkaline Phosphatase: 75 U/L (ref 39–117)
BUN: 25 mg/dL — ABNORMAL HIGH (ref 6–23)
CO2: 28 mEq/L (ref 19–32)
Calcium: 8.9 mg/dL (ref 8.4–10.5)
Chloride: 101 mEq/L (ref 96–112)
Creat: 1.04 mg/dL (ref 0.50–1.35)
Glucose, Bld: 100 mg/dL — ABNORMAL HIGH (ref 70–99)
Potassium: 5.2 mEq/L (ref 3.5–5.3)
Sodium: 140 mEq/L (ref 135–145)
Total Bilirubin: 0.6 mg/dL (ref 0.3–1.2)
Total Protein: 6.4 g/dL (ref 6.0–8.3)

## 2012-11-04 LAB — CBC WITH DIFFERENTIAL/PLATELET
Basophils Absolute: 0 10*3/uL (ref 0.0–0.1)
Basophils Relative: 0 % (ref 0–1)
Eosinophils Absolute: 0.3 10*3/uL (ref 0.0–0.7)
Eosinophils Relative: 3 % (ref 0–5)
HCT: 35.6 % — ABNORMAL LOW (ref 39.0–52.0)
Hemoglobin: 12.3 g/dL — ABNORMAL LOW (ref 13.0–17.0)
Lymphocytes Relative: 12 % (ref 12–46)
Lymphs Abs: 0.9 10*3/uL (ref 0.7–4.0)
MCH: 31.6 pg (ref 26.0–34.0)
MCHC: 34.6 g/dL (ref 30.0–36.0)
MCV: 91.5 fL (ref 78.0–100.0)
Monocytes Absolute: 1 10*3/uL (ref 0.1–1.0)
Monocytes Relative: 13 % — ABNORMAL HIGH (ref 3–12)
Neutro Abs: 5.5 10*3/uL (ref 1.7–7.7)
Neutrophils Relative %: 72 % (ref 43–77)
Platelets: 384 10*3/uL (ref 150–400)
RBC: 3.89 MIL/uL — ABNORMAL LOW (ref 4.22–5.81)
RDW: 13.9 % (ref 11.5–15.5)
WBC: 7.7 10*3/uL (ref 4.0–10.5)

## 2012-11-04 NOTE — Assessment & Plan Note (Signed)
No fever, normal WOB With recent travel Place on antibiotics He declines CXR CBC w diff, and metabolic panel to be obtained

## 2012-11-04 NOTE — Assessment & Plan Note (Signed)
Fairly large deep tissue hematoma on thigh, states much improved from initial injury, will monitor for now

## 2012-11-04 NOTE — Progress Notes (Signed)
  Subjective:    Patient ID: [name], male    DOB: 10-27-1933, 79 y.o.   MRN: [id number]  HPI  Pt here to f/u fall. Was participating in a religious walk in honor of his late wife in [place]. This was 2 weeks ago. He fell when the gravel was rolling down a hillside they were climbing, he fell right arm outstretched ,which gave away and he hit his face and right arm. He subsequently fell 3 more times that day, bruising his ribs and his left thigh. He did not seek medical treatment in [place], he was on the walk with a few doctors , one of which gave him 2 days of anti-inflammatories which helped significantly. He continues to have pain in his left ribs, worse with coughing. He has soreness over his brusies on arms and his left thigh. States swelling on thigh has gone down tremendously.   Cough with production for past 2 weeks, denies hemoptysis, denies fever, feels like he is sick. Staying hydrated, has not taken any OTC cough meds. Denies SOB, CP, wheezing   Review of Systems  GEN- denies fatigue, fever, weight loss,weakness, recent illness HEENT- denies eye drainage, change in vision, nasal discharge, CVS- denies chest pain, palpitations RESP- denies SOB, +cough, wheeze ABD- denies N/V, change in stools, abd pain GU- denies dysuria, hematuria, dribbling, incontinence MSK- + joint pain, +muscle aches, injury Neuro- denies headache, dizziness, syncope, seizure activity      Objective:   Physical Exam GEN- NAD, alert and oriented x3 HEENT- PERRL, EOMI, non injected sclera, pink conjunctiva, MMM, oropharynx clear, face NT Neck- Supple, no LAD, good ROM , Spine NT CVS- RRR, no murmur RESP-upper airway congestion, otherwise clear MSK- mild TTP along T10-T11 left side, Spine NT, Good ROM LE, left Thigh- grapefruit size deep tissue hematoma palpated ABD-NABS,soft, NT,ND Skin- large hematoma right arm, yellowing ecchymosis on right side of face beneath eye, no battle sign, no  brusing beneath eye,  EXT- No edema Pulses- Radial, DP- 2+        Assessment & Plan:

## 2012-11-04 NOTE — Assessment & Plan Note (Signed)
Possible fracture non displaced, he declines imaging, will give short course of NSAIDS as improved pain

## 2012-11-21 ENCOUNTER — Ambulatory Visit (INDEPENDENT_AMBULATORY_CARE_PROVIDER_SITE_OTHER): Payer: Medicare Other | Admitting: Family Medicine

## 2012-11-21 ENCOUNTER — Encounter: Payer: Self-pay | Admitting: Family Medicine

## 2012-11-21 VITALS — BP 124/70 | HR 68 | Temp 97.0°F | Resp 18 | Wt 175.0 lb

## 2012-11-21 DIAGNOSIS — G4762 Sleep related leg cramps: Secondary | ICD-10-CM

## 2012-11-21 DIAGNOSIS — R252 Cramp and spasm: Secondary | ICD-10-CM

## 2012-11-21 DIAGNOSIS — M25519 Pain in unspecified shoulder: Secondary | ICD-10-CM

## 2012-11-21 DIAGNOSIS — M25512 Pain in left shoulder: Secondary | ICD-10-CM

## 2012-11-21 NOTE — Patient Instructions (Signed)
Stay off the simvastatin for 2 weeks  Continue the current anti-inflammatory Increase water  Range of Motion for shoulder  Call me in 2 weeks for follow up on legs and shoulder

## 2012-11-22 LAB — CK: Total CK: 113 U/L (ref 7–232)

## 2012-11-23 ENCOUNTER — Encounter: Payer: Self-pay | Admitting: Family Medicine

## 2012-11-23 DIAGNOSIS — G4762 Sleep related leg cramps: Secondary | ICD-10-CM | POA: Insufficient documentation

## 2012-11-23 DIAGNOSIS — M25519 Pain in unspecified shoulder: Secondary | ICD-10-CM | POA: Insufficient documentation

## 2012-11-23 HISTORY — DX: Pain in unspecified shoulder: M25.519

## 2012-11-23 HISTORY — DX: Sleep related leg cramps: G47.62

## 2012-11-23 NOTE — Progress Notes (Signed)
  Subjective:    Patient ID: [name], male    DOB: 1933-03-06, 79 y.o.   MRN: [id number]  HPI  Pt here with left shoulder pain and cramps in legs and feet at night Leg cramps on and off for past few months, worse past couple of weeks since his fall. Has tried increased potassium and his water intake. On statin drug for years but had to be taken off a few due to leg cramps Shoulder pain since fall, did not notice as much due to other falls, unable to get arm up above shoulder level like his right one. Had difficulty getting shirt on. Had a massage yesterday which removed some knots he had in his upper back and shoulder and he is able to move shoulder much more than before. NSAIDS help pain when he takes them  Review of Systems  GEN- denies fatigue, fever, weight loss,weakness, recent illness HEENT- denies eye drainage, change in vision, nasal discharge, CVS- denies chest pain, palpitations RESP- denies SOB, cough, wheeze MSK- + joint pain, +muscle aches, injury Neuro- denies headache, dizziness, syncope, seizure activity      Objective:   Physical Exam  GEN- NAD, alert and oriented x3 Neck- Supple, fair ROM CVS- RRR, no murmur RESP-CTAB MSK- bilat shoulder- normal inspection, right arm fading bruise. Right shoulder good ROM, no impingment signs, rotator cuff in tact, left shoulder- Decreased ROM, +empty can on left side, able to scratch back around T11 level. Biceps in tact bilat EXT- No edema/ no leions on feet Neuro- CNII-XII in tact, no deficits noted, monofilament in tact Pulses- Radial, DP- 2+       Assessment & Plan:

## 2012-11-23 NOTE — Assessment & Plan Note (Addendum)
Will give trial off his statin drug, he has had difficulty in past with statins His K levels have been good Would avoid quinine due to his cardiac history  CK checked and wnl with his recent fall

## 2012-11-23 NOTE — Assessment & Plan Note (Signed)
I a sure this is one of the injuries he sustained when he fell, he had such significant pain in many places not that others are improved this is lingering. HIs ROM is diminished this could be a strain down to a tear of the muscle or rotatator cuff He is on NSAIDS as needed, given exercises, ICE He declines referral to ortho at this time, will f/u 2 weeks if not better and ROm not improved

## 2012-12-09 ENCOUNTER — Telehealth: Payer: Self-pay | Admitting: Family Medicine

## 2012-12-09 NOTE — Telephone Encounter (Signed)
He called to report that he has been off Simvastatin for 2 weeks and it has made no difference in his leg cramps.  He states that he is going to start the medicine back since there was no difference.

## 2012-12-09 NOTE — Telephone Encounter (Signed)
Please let him know okay to continue statin drug He can try a Vitamin B complex OTC , there as been some evidence that I researched that this may help

## 2012-12-19 NOTE — Telephone Encounter (Signed)
No answer at this time

## 2013-01-02 ENCOUNTER — Encounter: Payer: Self-pay | Admitting: Family Medicine

## 2013-01-02 ENCOUNTER — Ambulatory Visit (INDEPENDENT_AMBULATORY_CARE_PROVIDER_SITE_OTHER): Payer: Medicare Other | Admitting: Family Medicine

## 2013-01-02 VITALS — BP 100/60 | HR 78 | Temp 97.1°F | Resp 16 | Ht 67.0 in | Wt 182.0 lb

## 2013-01-02 DIAGNOSIS — L723 Sebaceous cyst: Secondary | ICD-10-CM

## 2013-01-02 MED ORDER — SILDENAFIL CITRATE 20 MG PO TABS: ORAL_TABLET | ORAL | Status: DC

## 2013-01-02 NOTE — Progress Notes (Signed)
Subjective:    Patient ID: [name], male    DOB: [birth year], 79 y.o.   MRN: [id number]  HPI Patient is a very pleasant 79 year old gentleman who has a 20 year history of a sebaceous cyst on the posterior aspect of his neck. However recently over the last week it has become red hot and inflamed. He is requesting treatment for this. He also would like a cheaper option than Viagra. Back is causing him $40 a pill. Past Medical History  Diagnosis Date  . CAD (coronary artery disease)     s/p cypher DES to pLAD 6/08; normal LVF;  ETT-Myoview 2009: no ischemia   . Hyperlipidemia   . HTN (hypertension)   . Allergy   . Myocardial infarction   . BPH (benign prostatic hypertrophy)    Past Surgical History  Procedure Laterality Date  . Exploratory laparotomy      age 79  . Appendectomy    . Cardiac catheterization  07/30/2006    CORONARY ANGIOPLASTY WITH STENT PLACEMENT   Current Outpatient Prescriptions on File Prior to Visit  Medication Sig Dispense Refill  . amLODipine-benazepril (LOTREL) 5-20 MG per capsule TAKE 1 CAPSULE EVERY DAY  90 capsule  3  . aspirin 325 MG EC tablet Take by mouth daily.        . Calcium Carbonate-Vitamin D (CALCARB 600/D) 600-400 MG-UNIT per tablet Take by mouth daily.        . clopidogrel (PLAVIX) 75 MG tablet Take 1 tablet (75 mg total) by mouth daily.  30 tablet  5  . Coenzyme Q10 (CO Q-10) 300 MG CAPS Take 100 mg by mouth daily.       Marland Kitchen ibuprofen (ADVIL,MOTRIN) 600 MG tablet Take 1 tablet (600 mg total) by mouth every 6 (six) hours as needed for pain.  30 tablet  1  . JALYN 0.5-0.4 MG CAPS Take 1 tablet by mouth daily.      . Multiple Vitamin (MULTIVITAMIN) capsule Take 1 capsule by mouth daily.        . nitroGLYCERIN (NITROSTAT) 0.4 MG SL tablet ONE TABLET UNDER TONGUE EVERY 5 MINUTES AS NEEDED FOR CHEST PAIN---MAY REPEAT TIMES THREE  25 tablet  1  . sildenafil (VIAGRA) 100 MG tablet Take 1 tablet (100 mg total) by mouth daily as needed for  erectile dysfunction.  10 tablet  11  . simvastatin (ZOCOR) 40 MG tablet TAKE 1 TABLET BY MOUTH AT BEDTIME  90 tablet  3   No current facility-administered medications on file prior to visit.   No Known Allergies History   Social History  . Marital Status: Single    Spouse Name: N/A    Number of Children: N/A  . Years of Education: N/A   Occupational History  . Not on file.   Social History Main Topics  . Smoking status: Former Games developer  . Smokeless tobacco: Not on file     Comment: quit over 54yrs ago.  . Alcohol Use: No  . Drug Use: No  . Sexual Activity: Not on file   Other Topics Concern  . Not on file   Social History Narrative  . No narrative on file       Review of Systems  All other systems reviewed and are negative.       Objective:   Physical Exam  Vitals reviewed. Cardiovascular: Normal rate and regular rhythm.   Pulmonary/Chest: Effort normal and breath sounds normal.   inflamed 3 cm sebaceous cyst on the  posterior aspect of his neck.        Assessment & Plan:  1. Inflamed sebaceous cyst The area was anesthetized with 0.1% lidocaine without epinephrine. A 1 cm incision was made and copious purulent debris was expressed from the cyst cavity. The cyst cavity was then packed with 10 cm of iodoform gauze. Wound care was discussed. The wound is dressed. The patient is to remove a small amount of packing every day until the packing is removed. Anticipate self-limited resolution in 2-3 weeks. He is to followup next week if pain persists or symptoms worsen.  I also gave the patient a prescription for sildenafil 20 mg tablets. He is taking 3-5 tablets as needed for erectile dysfunction. Also gave him information to contact [facility].

## 2013-01-05 NOTE — Telephone Encounter (Signed)
Pt aware of provider recommendations  

## 2013-01-19 ENCOUNTER — Telehealth: Payer: Self-pay | Admitting: Family Medicine

## 2013-01-19 NOTE — Telephone Encounter (Signed)
[employer] has called about the Request of medication Sildenafil was not approved Medicare does cover that medication with that diagnosis code

## 2013-01-19 NOTE — Telephone Encounter (Signed)
Medicare does not cover any meds for sexual dysfunction. PA was sent and it was denied. Denial was faxed to pharmacy.

## 2013-02-17 ENCOUNTER — Telehealth: Payer: Self-pay | Admitting: Physician Assistant

## 2013-02-17 NOTE — Telephone Encounter (Signed)
New message    Urologist took him off plavix because he was bleeding-----have been off for 4wks and the bleeding has stopped-- but want to talk to [place] about it.

## 2013-02-18 NOTE — Telephone Encounter (Signed)
tried to reach pt from his call yesterday afternoon about the plavix and that bleeding has no stopped. I lmptcb however; I will continue to try and reach pt as well.

## 2013-02-19 MED ORDER — ASPIRIN EC 81 MG PO TBEC: 81.0000 mg | DELAYED_RELEASE_TABLET | Freq: Every day | ORAL | Status: DC

## 2013-02-19 NOTE — Telephone Encounter (Signed)
lmptcb about ok to stay off plavix and to take ASA 81 mg daily per Richardson Dopp, PAC and Dr. [name]

## 2013-02-19 NOTE — Telephone Encounter (Signed)
I will review with Dr. Sherren Mocha. Richardson Dopp, PA-C   02/19/2013 1:21 PM

## 2013-02-19 NOTE — Addendum Note (Signed)
Addended by: Michae Kava on: 02/19/2013 05:50 PM   Modules accepted: Orders, Medications

## 2013-02-19 NOTE — Telephone Encounter (Signed)
Reviewed with Dr. Sherren Mocha. Ok to remain on ASA 81 QD only. Richardson Dopp, PA-C   02/19/2013 5:02 PM

## 2013-02-19 NOTE — Telephone Encounter (Signed)
lmptcb about ok to stay off plavix and to stay on ASA 81 mg daily per Richardson Dopp, PAC and Dr. [name]

## 2013-02-19 NOTE — Telephone Encounter (Signed)
s/w pt about the bleeding has stopped and that he has not been on plavix almost 1 month. Pt states [employer]. PA told him at last ov he may not need to stay on plavix since he has been on it a long time already. I advised I will d/w PA and cb later today,

## 2013-02-27 ENCOUNTER — Other Ambulatory Visit: Payer: Self-pay | Admitting: Family Medicine

## 2013-02-27 MED ORDER — AMLODIPINE BESY-BENAZEPRIL HCL 5-20 MG PO CAPS: ORAL_CAPSULE | ORAL | Status: DC

## 2013-02-27 NOTE — Telephone Encounter (Signed)
Rx Refilled  

## 2013-03-17 ENCOUNTER — Ambulatory Visit (INDEPENDENT_AMBULATORY_CARE_PROVIDER_SITE_OTHER): Payer: Medicare Other | Admitting: Family Medicine

## 2013-03-17 ENCOUNTER — Encounter: Payer: Self-pay | Admitting: Family Medicine

## 2013-03-17 VITALS — BP 132/74 | HR 58 | Temp 97.2°F | Resp 16 | Ht 67.0 in | Wt 177.0 lb

## 2013-03-17 DIAGNOSIS — I2510 Atherosclerotic heart disease of native coronary artery without angina pectoris: Secondary | ICD-10-CM

## 2013-03-17 DIAGNOSIS — I10 Essential (primary) hypertension: Secondary | ICD-10-CM

## 2013-03-17 DIAGNOSIS — E785 Hyperlipidemia, unspecified: Secondary | ICD-10-CM

## 2013-03-17 DIAGNOSIS — Z125 Encounter for screening for malignant neoplasm of prostate: Secondary | ICD-10-CM

## 2013-03-17 DIAGNOSIS — Z23 Encounter for immunization: Secondary | ICD-10-CM

## 2013-03-17 DIAGNOSIS — Z0000 Encounter for general adult medical examination without abnormal findings: Secondary | ICD-10-CM

## 2013-03-17 LAB — CBC WITH DIFFERENTIAL/PLATELET
Basophils Absolute: 0 10*3/uL (ref 0.0–0.1)
Basophils Relative: 1 % (ref 0–1)
Eosinophils Absolute: 1.3 10*3/uL — ABNORMAL HIGH (ref 0.0–0.7)
Eosinophils Relative: 16 % — ABNORMAL HIGH (ref 0–5)
HCT: 40.3 % (ref 39.0–52.0)
Hemoglobin: 13.9 g/dL (ref 13.0–17.0)
Lymphocytes Relative: 15 % (ref 12–46)
Lymphs Abs: 1.2 10*3/uL (ref 0.7–4.0)
MCH: 32 pg (ref 26.0–34.0)
MCHC: 34.5 g/dL (ref 30.0–36.0)
MCV: 92.6 fL (ref 78.0–100.0)
Monocytes Absolute: 0.6 10*3/uL (ref 0.1–1.0)
Monocytes Relative: 7 % (ref 3–12)
Neutro Abs: 5.1 10*3/uL (ref 1.7–7.7)
Neutrophils Relative %: 61 % (ref 43–77)
Platelets: 192 10*3/uL (ref 150–400)
RBC: 4.35 MIL/uL (ref 4.22–5.81)
RDW: 13.5 % (ref 11.5–15.5)
WBC: 8.3 10*3/uL (ref 4.0–10.5)

## 2013-03-17 LAB — COMPLETE METABOLIC PANEL WITH GFR
ALT: 16 U/L (ref 0–53)
AST: 26 U/L (ref 0–37)
Albumin: 4.1 g/dL (ref 3.5–5.2)
Alkaline Phosphatase: 54 U/L (ref 39–117)
BUN: 25 mg/dL — ABNORMAL HIGH (ref 6–23)
CO2: 24 mEq/L (ref 19–32)
Calcium: 8.9 mg/dL (ref 8.4–10.5)
Chloride: 104 mEq/L (ref 96–112)
Creat: 1.11 mg/dL (ref 0.50–1.35)
GFR, Est African American: 74 mL/min
GFR, Est Non African American: 64 mL/min
Glucose, Bld: 96 mg/dL (ref 70–99)
Potassium: 4.8 mEq/L (ref 3.5–5.3)
Sodium: 140 mEq/L (ref 135–145)
Total Bilirubin: 0.8 mg/dL (ref 0.2–1.2)
Total Protein: 6.6 g/dL (ref 6.0–8.3)

## 2013-03-17 LAB — LIPID PANEL
Cholesterol: 168 mg/dL (ref 0–200)
HDL: 72 mg/dL (ref >39)
LDL Cholesterol: 84 mg/dL (ref 0–99)
Total CHOL/HDL Ratio: 2.3 Ratio
Triglycerides: 59 mg/dL (ref <150)
VLDL: 12 mg/dL (ref 0–40)

## 2013-03-17 LAB — PSA, MEDICARE: PSA: 0.39 ng/mL (ref <=4.00)

## 2013-03-17 MED ORDER — TADALAFIL 5 MG PO TABS: 5.0000 mg | ORAL_TABLET | Freq: Every day | ORAL | Status: DC | PRN

## 2013-03-17 MED ORDER — TAMSULOSIN HCL 0.4 MG PO CAPS: 0.4000 mg | ORAL_CAPSULE | Freq: Every day | ORAL | Status: DC

## 2013-03-17 NOTE — Progress Notes (Signed)
Subjective:    Patient ID: [name], male    DOB: Dec 06, 1933, 80 y.o.   MRN: [id number]  HPI Patient is here today for CPE.  Patient is due for a fasting lab work. He had Zostavax in 2013, Pneumovax 23 in 2003, a tetanus vaccine in 2010.  His last colonoscopy was in 2005.   He is due for repeat colonoscopy.  He recently had his prostate examined by his urologist due to the workup for hematuria. His hematuria resolved after he stopped Plavix. He continues to have problems with erectile dysfunction. He is currently taking jalyn for BPH.  CT scan of the abdomen in September revealed a 3.1 cm AAA.  His brother died from a ruptured aaa. Past Medical History  Diagnosis Date  . CAD (coronary artery disease)     s/p cypher DES to pLAD 6/08; normal LVF;  ETT-Myoview 2009: no ischemia   . Hyperlipidemia   . HTN (hypertension)   . Allergy   . Myocardial infarction   . BPH (benign prostatic hypertrophy)    Past Surgical History  Procedure Laterality Date  . Exploratory laparotomy      age 86  . Appendectomy    . Cardiac catheterization  07/30/2006    CORONARY ANGIOPLASTY WITH STENT PLACEMENT   Current Outpatient Prescriptions on File Prior to Visit  Medication Sig Dispense Refill  . amLODipine-benazepril (LOTREL) 5-20 MG per capsule TAKE 1 CAPSULE EVERY DAY  90 capsule  3  . aspirin EC 81 MG tablet Take 1 tablet (81 mg total) by mouth daily.      . Calcium Carbonate-Vitamin D (CALCARB 600/D) 600-400 MG-UNIT per tablet Take by mouth daily.        . Coenzyme Q10 (CO Q-10) 300 MG CAPS Take 100 mg by mouth daily.       Marland Kitchen ibuprofen (ADVIL,MOTRIN) 600 MG tablet Take 1 tablet (600 mg total) by mouth every 6 (six) hours as needed for pain.  30 tablet  1  . JALYN 0.5-0.4 MG CAPS Take 1 tablet by mouth daily.      . Multiple Vitamin (MULTIVITAMIN) capsule Take 1 capsule by mouth daily.        . nitroGLYCERIN (NITROSTAT) 0.4 MG SL tablet ONE TABLET UNDER TONGUE EVERY 5 MINUTES AS NEEDED FOR  CHEST PAIN---MAY REPEAT TIMES THREE  25 tablet  1  . sildenafil (REVATIO) 20 MG tablet Take 3-5 tablets at a time as needed per day.  50 tablet  3  . sildenafil (VIAGRA) 100 MG tablet Take 1 tablet (100 mg total) by mouth daily as needed for erectile dysfunction.  10 tablet  11  . simvastatin (ZOCOR) 40 MG tablet TAKE 1 TABLET BY MOUTH AT BEDTIME  90 tablet  3   No current facility-administered medications on file prior to visit.   No Known Allergies History   Social History  . Marital Status: Single    Spouse Name: N/A    Number of Children: N/A  . Years of Education: N/A   Occupational History  . Not on file.   Social History Main Topics  . Smoking status: Former Research scientist (life sciences)  . Smokeless tobacco: Not on file     Comment: quit over 52yrs ago.  . Alcohol Use: No  . Drug Use: No  . 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 Not on file   Social History Narrative  .  No narrative on file   Family History  Problem Relation Age of Onset  . Coronary artery disease Father 66  . Coronary artery disease Brother   . Aortic aneurysm Brother       Review of Systems  All other systems reviewed and are negative.       Objective:   Physical Exam  Vitals reviewed. Constitutional: He is oriented to person, place, and time. He appears well-developed and well-nourished. No distress.  HENT:  Head: Normocephalic and atraumatic.  Right Ear: External ear normal.  Left Ear: External ear normal.  Nose: Nose normal.  Mouth/Throat: Oropharynx is clear and moist. No oropharyngeal exudate.  Eyes: Conjunctivae and EOM are normal. Pupils are equal, round, and reactive to light. Right eye exhibits no discharge. Left eye exhibits no discharge. No scleral icterus.  Neck: Normal range of motion. Neck supple. No JVD present. No tracheal deviation present. No thyromegaly present.  Cardiovascular: Normal rate, regular  rhythm, normal heart sounds and intact distal pulses.  Exam reveals no gallop and no friction rub.   No murmur heard. Pulmonary/Chest: Effort normal and breath sounds normal. No stridor. No respiratory distress. He has no wheezes. He has no rales. He exhibits no tenderness.  Abdominal: Soft. Bowel sounds are normal. He exhibits no distension and no mass. There is no tenderness. There is no rebound and no guarding.  Genitourinary: Rectum normal, prostate normal and penis normal. Guaiac negative stool. No penile tenderness.  Musculoskeletal: Normal range of motion. He exhibits no edema and no tenderness.  Lymphadenopathy:    He has no cervical adenopathy.  Neurological: He is alert and oriented to person, place, and time. He has normal reflexes. He displays normal reflexes. No cranial nerve deficit. He exhibits normal muscle tone. Coordination normal.  Skin: Skin is warm. No rash noted. He is not diaphoretic. No erythema. No pallor.  Psychiatric: He has a normal mood and affect. His behavior is normal. Judgment and thought content normal.           Assessment & Plan:  1. Routine general medical examination at a health care facility Patient is due for prevnar 13 and received that today in clinic.  Otherwise, his immunizations are up to date.  Colonosocpy is due this year.  Patient would like to wait until September and he will call me when he wants me to schedule this. He will also call in September to remind me to schedule the patient for an ultrasound of his aorta to monitor his aaa.  D/c jalyn, start flomax 0.4 mg poqday and cialis 5 mg poqday for BPH and ED. - COMPLETE METABOLIC PANEL WITH GFR - Lipid panel - CBC with Differential - PSA, Medicare  2. CAD (coronary artery disease) - COMPLETE METABOLIC PANEL WITH GFR - Lipid panel - CBC with Differential  3. Screening for prostate cancer I anticipate his PSA will increase after discontinuing his medication - PSA, Medicare  4. HTN  (hypertension) At goal  5. HLD (hyperlipidemia) Goal LDL is less than 70.

## 2013-03-17 NOTE — Addendum Note (Signed)
Addended by: Shary Decamp B on: 03/17/2013 10:09 AM   Modules accepted: Orders

## 2013-03-19 ENCOUNTER — Other Ambulatory Visit: Payer: Self-pay | Admitting: Family Medicine

## 2013-03-19 NOTE — Telephone Encounter (Signed)
Medication stopped at last office visit. Refill denied.

## 2013-03-21 ENCOUNTER — Encounter: Payer: Self-pay | Admitting: Family Medicine

## 2013-03-21 ENCOUNTER — Telehealth: Payer: Self-pay | Admitting: Family Medicine

## 2013-03-21 NOTE — Telephone Encounter (Signed)
PA sent through [email or web address] to [facility].

## 2013-03-25 NOTE — Telephone Encounter (Signed)
[facility] does not do PA for his insurance - resent through [place] on 03/23/13

## 2013-04-01 NOTE — Telephone Encounter (Signed)
[facility] sent form that they do not do PA for this pt either. I called [employer] and it is in process and rep was not sure why it was sent back as being cxd. [employer] will send notification when the process is complete.

## 2013-04-01 NOTE — Telephone Encounter (Signed)
[employer] cancelled PA so I resent to [facility] for Medicare part d

## 2013-04-03 NOTE — Telephone Encounter (Signed)
Medication has been approved from 04/01/13-03/31/14 -  Faxed to pharm.

## 2013-04-16 ENCOUNTER — Other Ambulatory Visit: Payer: Self-pay | Admitting: Family Medicine

## 2013-04-16 MED ORDER — TAMSULOSIN HCL 0.4 MG PO CAPS: 0.4000 mg | ORAL_CAPSULE | Freq: Every day | ORAL | Status: DC

## 2013-04-16 NOTE — Telephone Encounter (Signed)
Rx Refilled for 90 days

## 2013-08-18 ENCOUNTER — Ambulatory Visit (INDEPENDENT_AMBULATORY_CARE_PROVIDER_SITE_OTHER): Payer: Medicare Other | Admitting: Family Medicine

## 2013-08-18 ENCOUNTER — Encounter: Payer: Self-pay | Admitting: Family Medicine

## 2013-08-18 VITALS — BP 130/78 | HR 80 | Temp 97.2°F | Resp 16 | Ht 67.0 in | Wt 181.0 lb

## 2013-08-18 DIAGNOSIS — I714 Abdominal aortic aneurysm, without rupture, unspecified: Secondary | ICD-10-CM

## 2013-08-18 DIAGNOSIS — N5201 Erectile dysfunction due to arterial insufficiency: Secondary | ICD-10-CM

## 2013-08-18 DIAGNOSIS — N529 Male erectile dysfunction, unspecified: Secondary | ICD-10-CM

## 2013-08-18 DIAGNOSIS — J45909 Unspecified asthma, uncomplicated: Secondary | ICD-10-CM

## 2013-08-18 DIAGNOSIS — N400 Enlarged prostate without lower urinary tract symptoms: Secondary | ICD-10-CM

## 2013-08-18 MED ORDER — ALBUTEROL SULFATE HFA 108 (90 BASE) MCG/ACT IN AERS: 2.0000 | INHALATION_SPRAY | Freq: Four times a day (QID) | RESPIRATORY_TRACT | Status: DC | PRN

## 2013-08-18 NOTE — Progress Notes (Signed)
Subjective:    Patient ID: [name], male    DOB: 1933-03-01, 80 y.o.   MRN: [id number]  HPI  Patient has a history of BPH.  He previously took [place] which worked well but caused problems with ejaculations.  We switched the patient to flomax alone and started him on cialis 5 mg poqday for BPH and ED.  His symptoms of BPH are adequately controlled but his erections are incomplete and more flacid.  He also has a history of a 3.1 cm AAA on CT in 9/14.  He has a history of mild intermittent asthma.  He has not used an inhaler in years.  He recently had a mild attack and would like to have an inhaler on hand just in case. Past Medical History  Diagnosis Date  . CAD (coronary artery disease)     s/p cypher DES to pLAD 6/08; normal LVF;  ETT-Myoview 2009: no ischemia   . Hyperlipidemia   . HTN (hypertension)   . Allergy   . Myocardial infarction   . BPH (benign prostatic hypertrophy)    Current Outpatient Prescriptions on File Prior to Visit  Medication Sig Dispense Refill  . amLODipine-benazepril (LOTREL) 5-20 MG per capsule TAKE 1 CAPSULE EVERY DAY  90 capsule  3  . aspirin EC 81 MG tablet Take 1 tablet (81 mg total) by mouth daily.      . Calcium Carbonate-Vitamin D (CALCARB 600/D) 600-400 MG-UNIT per tablet Take by mouth daily.        . Coenzyme Q10 (CO Q-10) 300 MG CAPS Take 100 mg by mouth daily.       Marland Kitchen ibuprofen (ADVIL,MOTRIN) 600 MG tablet Take 1 tablet (600 mg total) by mouth every 6 (six) hours as needed for pain.  30 tablet  1  . Multiple Vitamin (MULTIVITAMIN) capsule Take 1 capsule by mouth daily.        . nitroGLYCERIN (NITROSTAT) 0.4 MG SL tablet ONE TABLET UNDER TONGUE EVERY 5 MINUTES AS NEEDED FOR CHEST PAIN---MAY REPEAT TIMES THREE  25 tablet  1  . sildenafil (REVATIO) 20 MG tablet Take 3-5 tablets at a time as needed per day.  50 tablet  3  . simvastatin (ZOCOR) 40 MG tablet TAKE 1 TABLET BY MOUTH AT BEDTIME  90 tablet  3  . tadalafil (CIALIS) 5 MG tablet Take 1  tablet (5 mg total) by mouth daily as needed.  30 tablet  5  . tamsulosin (FLOMAX) 0.4 MG CAPS capsule Take 1 capsule (0.4 mg total) by mouth daily.  90 capsule  4   No current facility-administered medications on file prior to visit.   No Known Allergies History   Social History  . Marital Status: Single    Spouse Name: N/A    Number of Children: N/A  . Years of Education: N/A   Occupational History  . Not on file.   Social History Main Topics  . Smoking status: Former Research scientist (life sciences)  . Smokeless tobacco: Not on file     Comment: quit over 89yrs ago.  . Alcohol Use: No  . Drug Use: No  . 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 Not on file   Social History Narrative  . No narrative on file     Review of Systems  All other systems reviewed and are negative.      Objective:   Physical Exam  Vitals reviewed. Cardiovascular: Normal rate, regular rhythm and normal heart sounds.   Pulmonary/Chest: Effort normal and breath sounds normal. No respiratory distress. He has no wheezes. He has no rales.  Abdominal: Soft. Bowel sounds are normal. He exhibits no distension. There is no tenderness. There is no rebound and no guarding.  Musculoskeletal: He exhibits no edema.          Assessment & Plan:  1. Unspecified asthma(493.90) Patient is to call me if he needs the inhaler more than 2 times a week and 2 nights a month. - albuterol (PROVENTIL HFA;VENTOLIN HFA) 108 (90 BASE) MCG/ACT inhaler; Inhale 2 puffs into the lungs every 6 (six) hours as needed for wheezing or shortness of breath.  Dispense: 1 Inhaler; Refill: 0  2. BPH (benign prostatic hyperplasia) Continue flomax 0.4 mg poqhs.  3. Erectile dysfunction due to arterial insufficiency Switch to cialis 20 mg poqd prn ed.  He can take 4 tabs at a time as needed.  4. AAA (abdominal aortic aneurysm) without rupture Schedule screening scan in  September. - CT Abdomen Pelvis W Contrast; Future

## 2013-09-01 ENCOUNTER — Encounter: Payer: Self-pay | Admitting: Cardiovascular Disease

## 2013-09-01 ENCOUNTER — Ambulatory Visit (INDEPENDENT_AMBULATORY_CARE_PROVIDER_SITE_OTHER): Payer: Medicare Other | Admitting: Cardiovascular Disease

## 2013-09-01 VITALS — BP 168/80 | HR 76 | Ht 67.0 in | Wt 181.0 lb

## 2013-09-01 DIAGNOSIS — I10 Essential (primary) hypertension: Secondary | ICD-10-CM

## 2013-09-01 DIAGNOSIS — I2510 Atherosclerotic heart disease of native coronary artery without angina pectoris: Secondary | ICD-10-CM

## 2013-09-01 NOTE — Progress Notes (Signed)
HPI:  This is a 80 year old gentleman presenting for followup cardiac evaluation. The patient has been followed for coronary artery disease, hypertension, and hyperlipidemia. The patient underwent PCI (Cypher DES) of the proximal LAD in 2008. He had moderate diffuse residual CAD and medical therapy was recommended. The patient had an exercise treadmill study one year ago and this demonstrated no significant ST/T changes.  The patient is doing well. He was recently married. He went to [place] this past year and walked over 200 miles. He was planning on walking 500 miles but he had an injury with some rib fractures and had a cut his walk short. He does continue to exercise regularly and he has no symptoms with exertion. He specifically denies exertional chest pain or pressure, dyspnea, or lightheadedness. He's had no palpitations. He had an episode a few nights ago where he had some discomfort in his back and chest, but this was transient. It was unrelated to exertion. He's been physically active since then with no problems.  Outpatient Encounter Prescriptions as of 09/01/2013  Medication Sig  . albuterol (PROVENTIL HFA;VENTOLIN HFA) 108 (90 BASE) MCG/ACT inhaler Inhale 2 puffs into the lungs every 6 (six) hours as needed for wheezing or shortness of breath.  Marland Kitchen amLODipine-benazepril (LOTREL) 5-20 MG per capsule TAKE 1 CAPSULE EVERY DAY  . aspirin EC 81 MG tablet Take 1 tablet (81 mg total) by mouth daily.  . Calcium Carbonate-Vitamin D (CALCARB 600/D) 600-400 MG-UNIT per tablet Take by mouth daily.    . Coenzyme Q10 (CO Q-10) 300 MG CAPS Take 100 mg by mouth daily.   Marland Kitchen ibuprofen (ADVIL,MOTRIN) 600 MG tablet Take 1 tablet (600 mg total) by mouth every 6 (six) hours as needed for pain.  . Multiple Vitamin (MULTIVITAMIN) capsule Take 1 capsule by mouth daily.    . NON FORMULARY Omega Q 1 tab twice daily  . simvastatin (ZOCOR) 40 MG tablet TAKE 1 TABLET BY MOUTH AT BEDTIME  . tadalafil (CIALIS) 5 MG  tablet Take 1 tablet (5 mg total) by mouth daily as needed.  . tamsulosin (FLOMAX) 0.4 MG CAPS capsule Take 1 capsule (0.4 mg total) by mouth daily.  . [DISCONTINUED] nitroGLYCERIN (NITROSTAT) 0.4 MG SL tablet ONE TABLET UNDER TONGUE EVERY 5 MINUTES AS NEEDED FOR CHEST PAIN---MAY REPEAT TIMES THREE  . [DISCONTINUED] sildenafil (REVATIO) 20 MG tablet Take 3-5 tablets at a time as needed per day.    No Known Allergies  Past Medical History  Diagnosis Date  . CAD (coronary artery disease)     s/p cypher DES to pLAD 6/08; normal LVF;  ETT-Myoview 2009: no ischemia   . Hyperlipidemia   . HTN (hypertension)   . Allergy   . Myocardial infarction   . BPH (benign prostatic hypertrophy)     ROS: Negative except as per HPI  BP 168/80  Pulse 76  Ht 5\' 7"  (1.702 m)  Wt 181 lb (82.101 kg)  BMI 28.34 kg/m2  PHYSICAL EXAM: Pt is alert and oriented, NAD HEENT: normal Neck: JVP - normal, carotids 2+= without bruits Lungs: CTA bilaterally CV: RRR without murmur or gallop Abd: soft, NT, Positive BS, no hepatomegaly Ext: no C/C/E, distal pulses intact and equal Skin: warm/dry no rash  EKG:  Normal sinus rhythm 76 beats per minute, leftward axis, otherwise within normal limits.  ASSESSMENT AND PLAN: 1. Coronary artery disease, native vessel. The patient underwent PCI in 2008 with no recurrence of angina since that time. He discontinued clopidogrel about one  year ago. His medical regimen was reviewed and will be continued. I will see him back in one year for followup unless problems arise in the interim.  2. Hypertension. Blood pressure elevated today, but I reviewed his trend over the past year and he is normotensive on all other office visits. Will continue amlodipine, benazepril.  3. Hyperlipidemia. Lipids reviewed. Patient takes simvastatin. He is at goal.  For followup I will see him back in one year.   Sherren Mocha 09/01/2013 9:36 AM

## 2013-09-01 NOTE — Patient Instructions (Signed)
Your physician recommends that you continue on your current medications as directed. Please refer to the Current Medication list given to you today.  Your physician wants you to follow-up in: 1 YEAR with Dr Cooper.  You will receive a reminder letter in the mail two months in advance. If you don't receive a letter, please call our office to schedule the follow-up appointment.  

## 2013-09-30 ENCOUNTER — Other Ambulatory Visit: Payer: Self-pay | Admitting: Family Medicine

## 2013-10-07 ENCOUNTER — Telehealth: Payer: Self-pay | Admitting: Family Medicine

## 2013-10-07 NOTE — Telephone Encounter (Signed)
Patient is calling to let us know that he is using his rescue inhaler too excessively, he said he and dr pickard had talked about this, would like you to call him regarding this  [phone]

## 2013-10-07 NOTE — Telephone Encounter (Signed)
LMOVM that Dr. Dennard Schaumann not in office today that he would receive a call back tomorrow in regards to his question.

## 2013-10-08 MED ORDER — FLUTICASONE-SALMETEROL 250-50 MCG/DOSE IN AEPB: 1.0000 | INHALATION_SPRAY | Freq: Two times a day (BID) | RESPIRATORY_TRACT | Status: DC

## 2013-10-08 NOTE — Telephone Encounter (Signed)
Begin advair 250/50 inh bid and see if asthma is better controlled.  NTBS if worse.

## 2013-10-08 NOTE — Telephone Encounter (Signed)
Pt aware per vm and med sent to [facility]

## 2013-10-13 ENCOUNTER — Other Ambulatory Visit: Payer: Self-pay | Admitting: Family Medicine

## 2013-10-13 ENCOUNTER — Telehealth: Payer: Self-pay | Admitting: *Deleted

## 2013-10-13 DIAGNOSIS — I714 Abdominal aortic aneurysm, without rupture, unspecified: Secondary | ICD-10-CM

## 2013-10-13 NOTE — Telephone Encounter (Signed)
I called to check on status of CT abd/pelvis and [employer] stated needed more information, spoke with Jenny Reichmann and asked me questions on this pt was able to answer and then Jenny Reichmann stated that needed Peer to Peer from provider. Please call [phone] for further questions. Please call this number and follow prompts to diagnostic imaging and then prompt to peer to peer review.

## 2013-10-13 NOTE — Telephone Encounter (Signed)
Insurance has denied.  Schedule [place].

## 2013-10-13 NOTE — Telephone Encounter (Signed)
[place] scheduled for Sept 11 at 9:30am at [address] [facility]. LMOM for return call to pt.

## 2013-10-13 NOTE — Telephone Encounter (Signed)
Pt aware of appt.

## 2013-10-16 ENCOUNTER — Ambulatory Visit
Admission: RE | Admit: 2013-10-16 | Discharge: 2013-10-16 | Disposition: A | Discharge status: Home / Self Care | Payer: Medicare Other | Source: Ambulatory Visit | Attending: Family Medicine | Admitting: Family Medicine

## 2013-10-16 ENCOUNTER — Other Ambulatory Visit: Payer: Self-pay | Admitting: Family Medicine

## 2013-10-16 DIAGNOSIS — I714 Abdominal aortic aneurysm, without rupture, unspecified: Secondary | ICD-10-CM

## 2013-10-16 IMAGING — US US AORTA
1 series · 13 of 13 positions shown · non-contrast
Comparison: CT [DATE].

CLINICAL DATA: Abdominal aortic aneurysm.

EXAM:
ULTRASOUND OF ABDOMINAL AORTA
TECHNIQUE: Ultrasound examination of the abdominal aorta was performed to
evaluate for abdominal aortic aneurysm.

[Series 1: us aorta · 0.31mm/px · 13 of 13 slices shown]
[im 1/13]
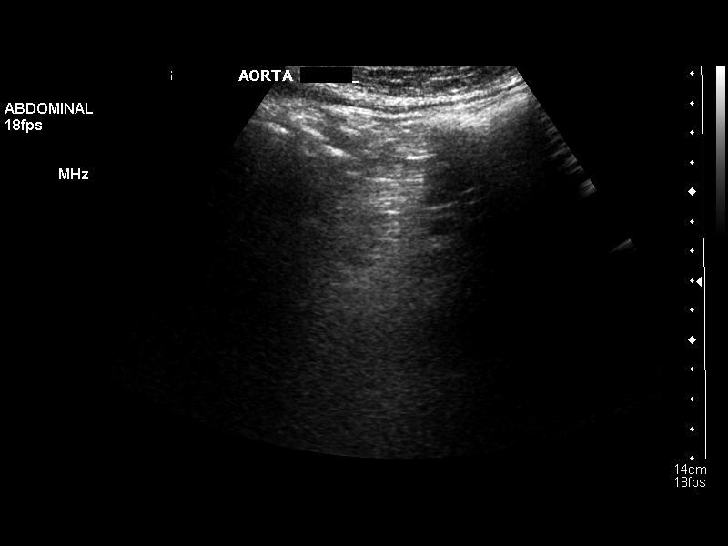
[im 2/13]
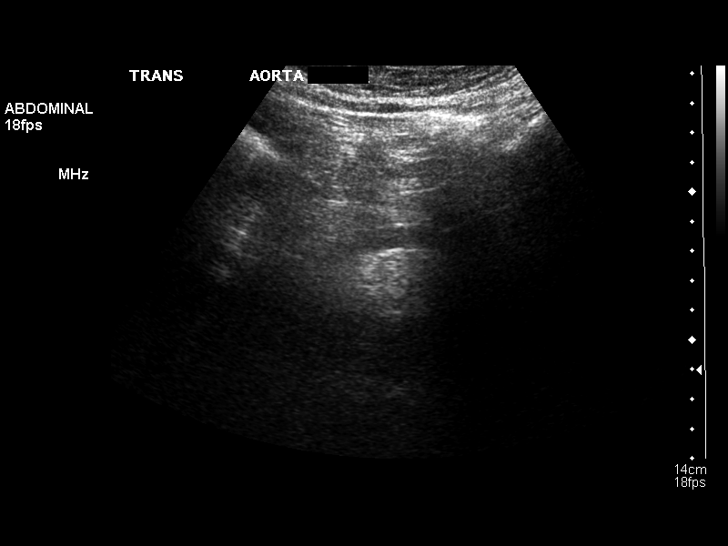
[im 3/13]
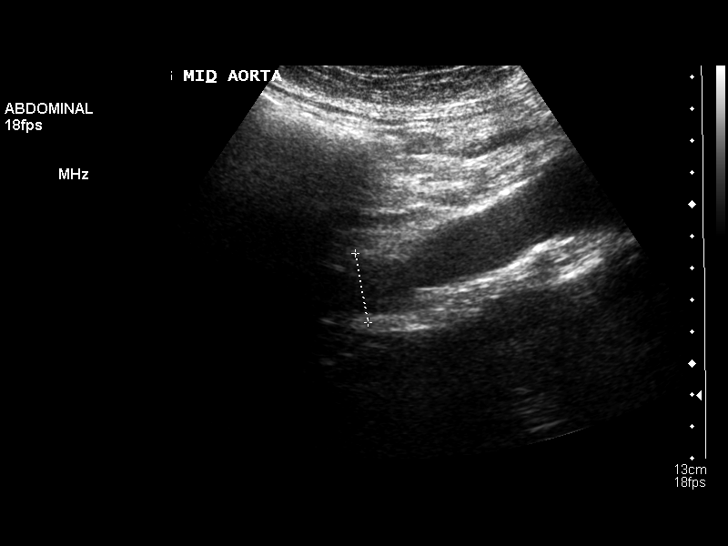
[im 4/13]
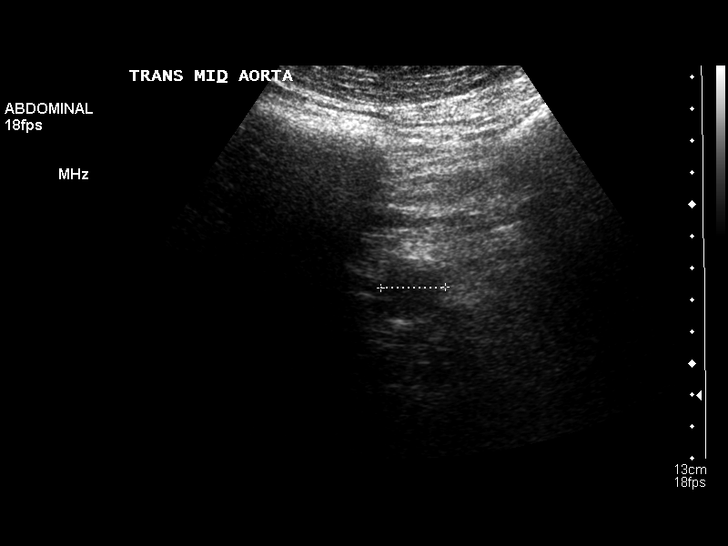
[im 5/13]
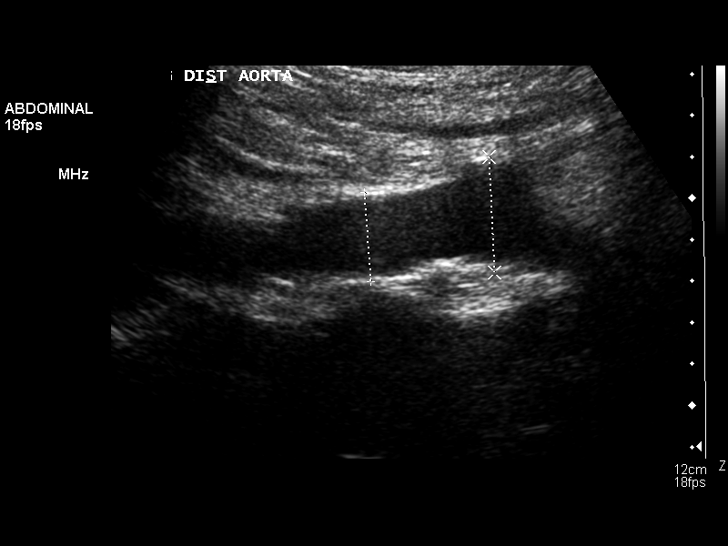
[im 6/13]
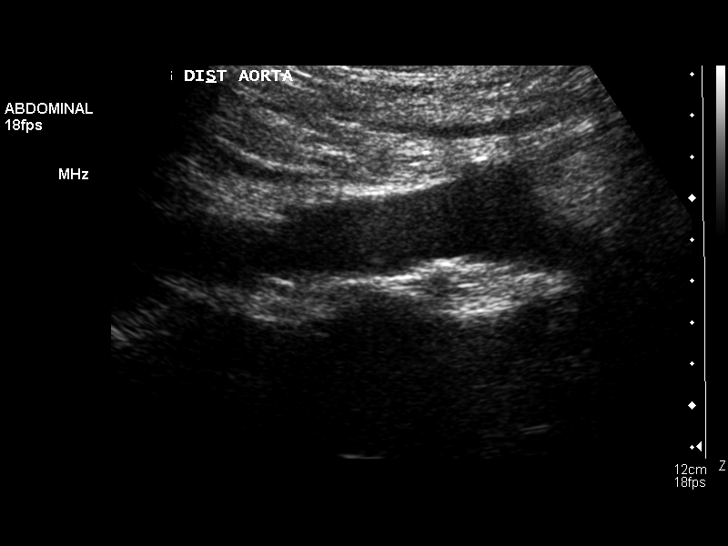
[im 7/13]
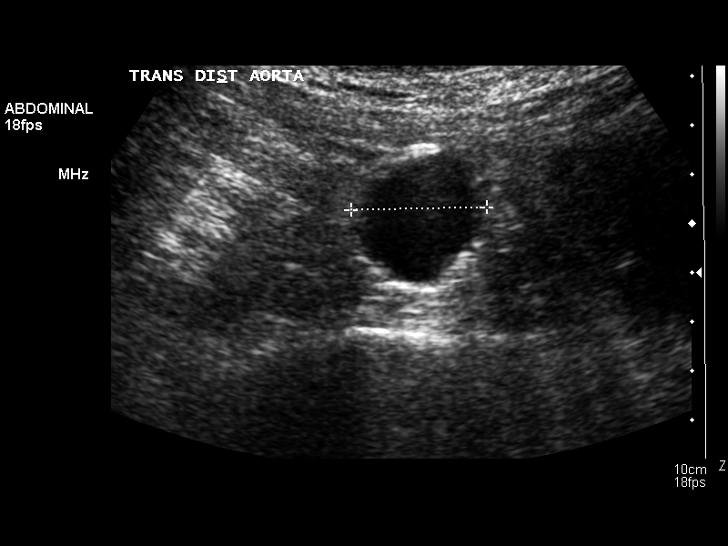
[im 8/13]
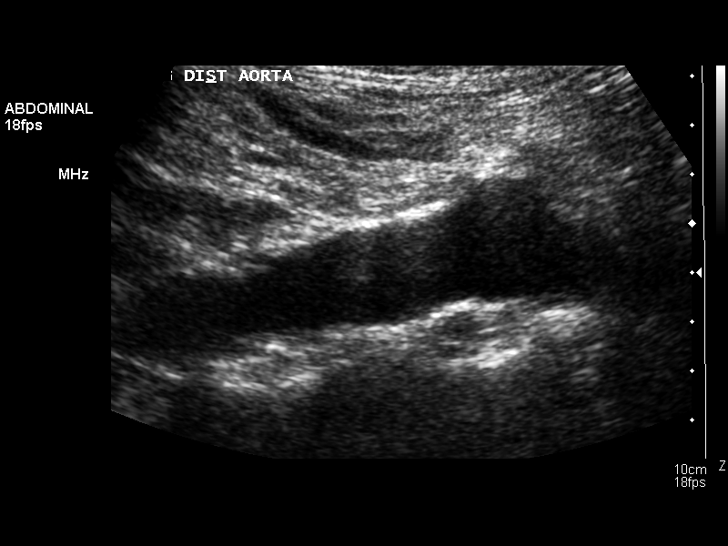
[im 9/13]
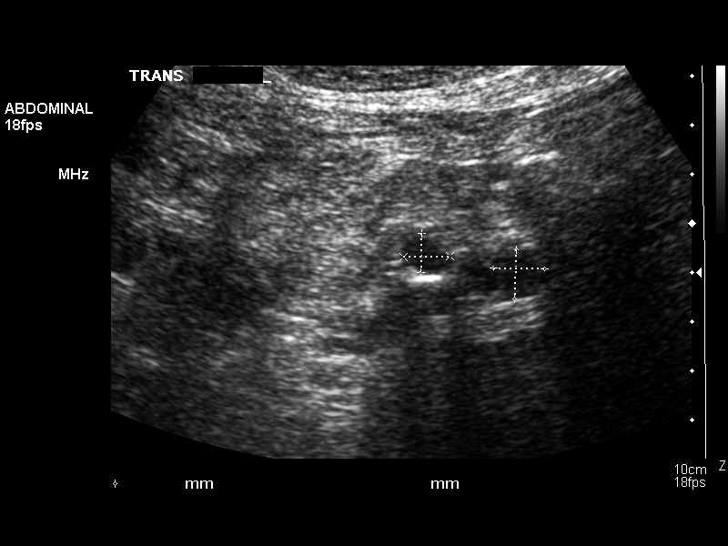
[im 10/13]
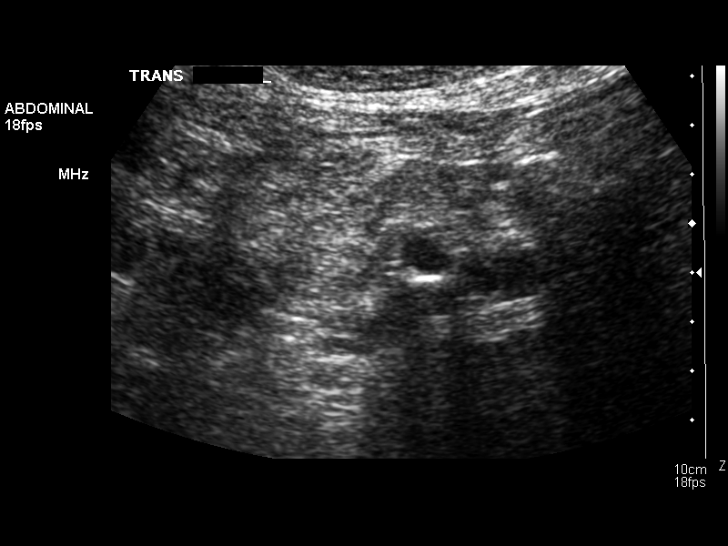
[im 11/13]
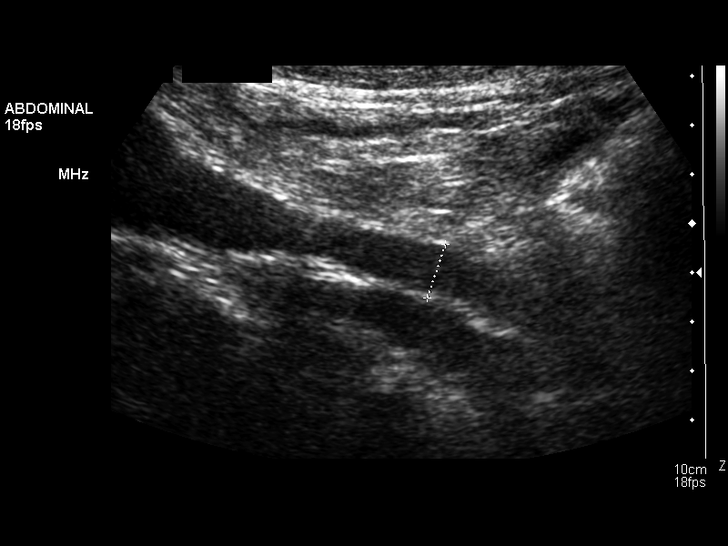
[im 12/13]
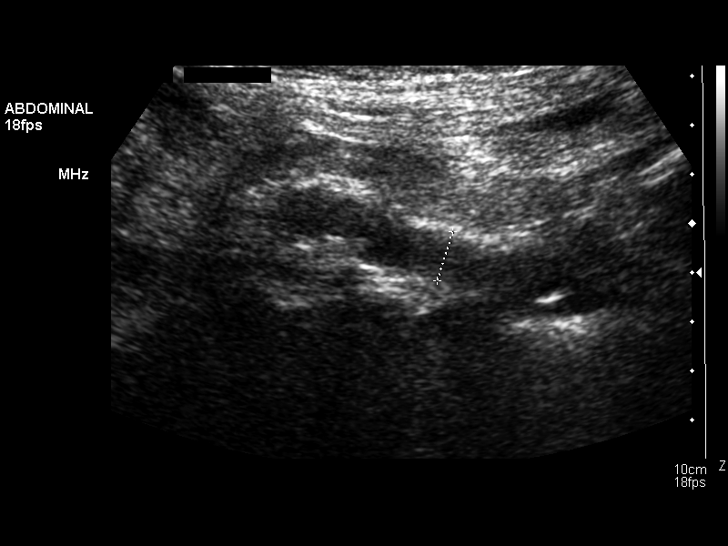
[im 13/13]
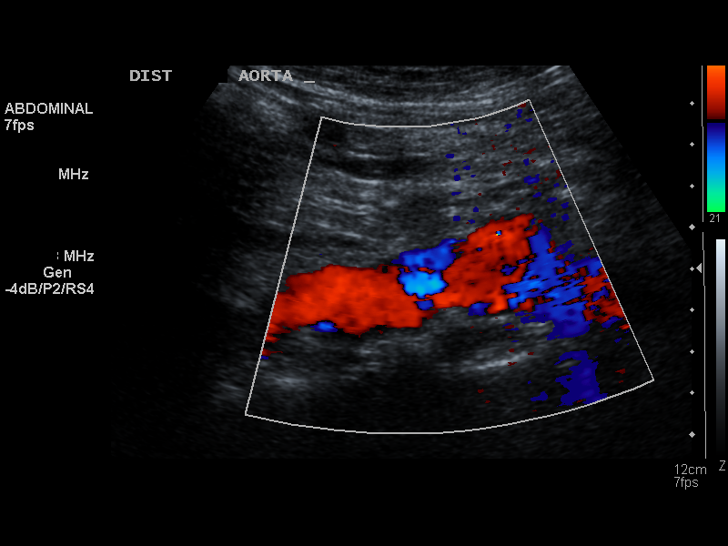

[13 of 13 positions shown; findings below may reference images not displayed]

FINDINGS: Abdominal Aorta

Proximal abdominal aorta obscured by gas. Abdominal aortic aneurysm
measuring up to 2.8 cm noted.

Maximum AP

Diameter:  2.8 cm

Maximum TRV

Diameter:
IMPRESSION: Proximal abdominal aorta obscured by gas. 2.8 x 2.8 cm abdominal
aortic aneurysm is present. Similar finding noted on prior CT of
[DATE].

## 2013-10-19 ENCOUNTER — Telehealth: Payer: Self-pay | Admitting: Family Medicine

## 2013-10-19 NOTE — Telephone Encounter (Signed)
Patient calling about results from a test he had done last week  [phone]

## 2013-10-20 NOTE — Telephone Encounter (Signed)
Patient aware of results on 10/19/13

## 2013-11-12 ENCOUNTER — Ambulatory Visit (INDEPENDENT_AMBULATORY_CARE_PROVIDER_SITE_OTHER): Payer: Medicare Other | Admitting: *Deleted

## 2013-11-12 DIAGNOSIS — Z23 Encounter for immunization: Secondary | ICD-10-CM | POA: Insufficient documentation

## 2013-11-12 NOTE — Progress Notes (Signed)
Patient ID: [name], male   DOB: March 08, 1933, 80 y.o.   MRN: [id number] Patient seen in office for Influenza Vaccination.   Tolerated IM administration well.

## 2013-12-07 ENCOUNTER — Other Ambulatory Visit: Payer: Self-pay | Admitting: Family Medicine

## 2013-12-07 NOTE — Telephone Encounter (Signed)
Refill appropriate and filled per protocol. 

## 2014-02-18 ENCOUNTER — Other Ambulatory Visit: Payer: Self-pay | Admitting: Family Medicine

## 2014-02-18 NOTE — Telephone Encounter (Signed)
Medication refilled per protocol. 

## 2014-02-21 ENCOUNTER — Other Ambulatory Visit: Payer: Self-pay | Admitting: Family Medicine

## 2014-02-22 NOTE — Telephone Encounter (Signed)
Refill appropriate and filled per protocol. 

## 2014-02-23 ENCOUNTER — Telehealth: Payer: Self-pay | Admitting: *Deleted

## 2014-02-23 NOTE — Telephone Encounter (Signed)
Received request from pharmacy for PA on Lotrel.  PA submitted.   Dx: I10.0

## 2014-03-02 NOTE — Telephone Encounter (Signed)
Received PA determination.   PA approved -02/25/2015.

## 2014-03-19 ENCOUNTER — Other Ambulatory Visit: Payer: Medicare Other

## 2014-03-19 DIAGNOSIS — Z125 Encounter for screening for malignant neoplasm of prostate: Secondary | ICD-10-CM

## 2014-03-19 DIAGNOSIS — Z0000 Encounter for general adult medical examination without abnormal findings: Secondary | ICD-10-CM

## 2014-03-19 DIAGNOSIS — I10 Essential (primary) hypertension: Secondary | ICD-10-CM

## 2014-03-19 DIAGNOSIS — I2510 Atherosclerotic heart disease of native coronary artery without angina pectoris: Secondary | ICD-10-CM

## 2014-03-19 DIAGNOSIS — E785 Hyperlipidemia, unspecified: Secondary | ICD-10-CM

## 2014-03-19 DIAGNOSIS — Z79899 Other long term (current) drug therapy: Secondary | ICD-10-CM

## 2014-03-19 LAB — COMPLETE METABOLIC PANEL WITH GFR
ALT: 15 U/L (ref 0–53)
AST: 27 U/L (ref 0–37)
Albumin: 4 g/dL (ref 3.5–5.2)
Alkaline Phosphatase: 48 U/L (ref 39–117)
BUN: 26 mg/dL — ABNORMAL HIGH (ref 6–23)
CO2: 24 mEq/L (ref 19–32)
Calcium: 8.9 mg/dL (ref 8.4–10.5)
Chloride: 107 mEq/L (ref 96–112)
Creat: 1.27 mg/dL (ref 0.50–1.35)
GFR, Est African American: 62 mL/min
GFR, Est Non African American: 54 mL/min — ABNORMAL LOW
Glucose, Bld: 94 mg/dL (ref 70–99)
Potassium: 4.4 mEq/L (ref 3.5–5.3)
Sodium: 140 mEq/L (ref 135–145)
Total Bilirubin: 0.6 mg/dL (ref 0.2–1.2)
Total Protein: 6.3 g/dL (ref 6.0–8.3)

## 2014-03-19 LAB — CBC WITH DIFFERENTIAL/PLATELET
Basophils Absolute: 0 10*3/uL (ref 0.0–0.1)
Basophils Relative: 0 % (ref 0–1)
Eosinophils Absolute: 1.1 10*3/uL — ABNORMAL HIGH (ref 0.0–0.7)
Eosinophils Relative: 17 % — ABNORMAL HIGH (ref 0–5)
HCT: 38.9 % — ABNORMAL LOW (ref 39.0–52.0)
Hemoglobin: 13 g/dL (ref 13.0–17.0)
Lymphocytes Relative: 17 % (ref 12–46)
Lymphs Abs: 1.1 10*3/uL (ref 0.7–4.0)
MCH: 31.3 pg (ref 26.0–34.0)
MCHC: 33.4 g/dL (ref 30.0–36.0)
MCV: 93.5 fL (ref 78.0–100.0)
MPV: 8.9 fL (ref 8.6–12.4)
Monocytes Absolute: 0.4 10*3/uL (ref 0.1–1.0)
Monocytes Relative: 7 % (ref 3–12)
Neutro Abs: 3.7 10*3/uL (ref 1.7–7.7)
Neutrophils Relative %: 59 % (ref 43–77)
Platelets: 215 10*3/uL (ref 150–400)
RBC: 4.16 MIL/uL — ABNORMAL LOW (ref 4.22–5.81)
RDW: 13.7 % (ref 11.5–15.5)
WBC: 6.3 10*3/uL (ref 4.0–10.5)

## 2014-03-19 LAB — LIPID PANEL
Cholesterol: 157 mg/dL (ref 0–200)
HDL: 59 mg/dL (ref >39)
LDL Cholesterol: 86 mg/dL (ref 0–99)
Total CHOL/HDL Ratio: 2.7 Ratio
Triglycerides: 60 mg/dL (ref <150)
VLDL: 12 mg/dL (ref 0–40)

## 2014-03-19 LAB — TSH: TSH: 1.385 u[IU]/mL (ref 0.350–4.500)

## 2014-03-20 LAB — PSA, MEDICARE: PSA: 0.99 ng/mL (ref <=4.00)

## 2014-03-23 ENCOUNTER — Ambulatory Visit (INDEPENDENT_AMBULATORY_CARE_PROVIDER_SITE_OTHER): Payer: Medicare Other | Admitting: Family Medicine

## 2014-03-23 ENCOUNTER — Encounter: Payer: Self-pay | Admitting: Family Medicine

## 2014-03-23 VITALS — BP 140/84 | HR 72 | Temp 98.3°F | Resp 18 | Ht 67.5 in | Wt 194.0 lb

## 2014-03-23 DIAGNOSIS — D721 Eosinophilia, unspecified: Secondary | ICD-10-CM | POA: Insufficient documentation

## 2014-03-23 DIAGNOSIS — Z0000 Encounter for general adult medical examination without abnormal findings: Secondary | ICD-10-CM

## 2014-03-23 MED ORDER — FLUTICASONE PROPIONATE 50 MCG/ACT NA SUSP: 2.0000 | Freq: Every day | NASAL | Status: DC

## 2014-03-23 NOTE — Progress Notes (Signed)
Subjective:    Patient ID: [name], male    DOB: 1933-09-22, 81 y.o.   MRN: [id number]  HPI Patient is here today for complete physical exam. He is doing very well. He denies any symptoms at all other than some rhinorrhea. He has been taking Benadryl as well as Zyrtec-D with minimal relief. Given his coronary history I'll prefer he not use these medications. I have recommended Flonase. Otherwise he is doing very well. Lab work is significant for persistent eosinophilia. It is mild. Furthermore his overall white blood cell count is normal. Patient denies any symptoms of a myeloproliferative disorder. He denies any fevers, night sweats, fatigue, myalgias, bruising. He is overdue for colonoscopy. Immunizations are all up-to-date including Pneumovax 23, Prevnar 13, his flu shot, his tetanus shot, and the shingles vaccine Past Medical History  Diagnosis Date  . CAD (coronary artery disease)     s/p cypher DES to pLAD 6/08; normal LVF;  ETT-Myoview 2009: no ischemia   . Hyperlipidemia   . HTN (hypertension)   . Allergy   . Myocardial infarction   . BPH (benign prostatic hypertrophy)    Past Surgical History  Procedure Laterality Date  . Exploratory laparotomy      age 70  . Appendectomy    . Cardiac catheterization  07/30/2006    CORONARY ANGIOPLASTY WITH STENT PLACEMENT   Current Outpatient Prescriptions on File Prior to Visit  Medication Sig Dispense Refill  . amLODipine-benazepril (LOTREL) 5-20 MG per capsule TAKE 1 CAPSULE EVERY DAY 90 capsule 3  . aspirin EC 81 MG tablet Take 1 tablet (81 mg total) by mouth daily.    . Calcium Carbonate-Vitamin D (CALCARB 600/D) 600-400 MG-UNIT per tablet Take by mouth daily.      Marland Kitchen CIALIS 5 MG tablet TAKE 1 TABLET BY MOUTH DAILY AS NEEDED. (DR SUBMITTED P.A. TO INS 03-25-13) 30 tablet 5  . Coenzyme Q10 (CO Q-10) 300 MG CAPS Take 100 mg by mouth daily.     Marland Kitchen ibuprofen (ADVIL,MOTRIN) 600 MG tablet Take 1 tablet (600 mg total) by mouth every 6  (six) hours as needed for pain. 30 tablet 1  . Multiple Vitamin (MULTIVITAMIN) capsule Take 1 capsule by mouth daily. Resetigen-D  Cellular rejuvenation formula    . NON FORMULARY Omega Q 1 tab twice daily    . simvastatin (ZOCOR) 40 MG tablet TAKE 1 TABLET BY MOUTH AT BEDTIME 90 tablet 2  . tamsulosin (FLOMAX) 0.4 MG CAPS capsule Take 1 capsule (0.4 mg total) by mouth daily. 90 capsule 4  . VENTOLIN HFA 108 (90 BASE) MCG/ACT inhaler INHALE 2 PUFFS INTO THE LUNGS EVERY 6 (SIX) HOURS AS NEEDED FOR WHEEZING OR SHORTNESS OF BREATH. 18 g 3  . Fluticasone-Salmeterol (ADVAIR DISKUS) 250-50 MCG/DOSE AEPB Inhale 1 puff into the lungs 2 (two) times daily. (Patient not taking: Reported on 03/23/2014) 1 each 3   No current facility-administered medications on file prior to visit.   No Known Allergies History   Social History  . Marital Status: Single    Spouse Name: N/A  . Number of Children: N/A  . Years of Education: N/A   Occupational History  . Not on file.   Social History Main Topics  . Smoking status: Former Research scientist (life sciences)  . Smokeless tobacco: Not on file     Comment: quit over 51yr ago.  . Alcohol Use: No  . Drug Use: No  . Sexual Activity: Not on file     Comment: widowed, wife  dies from colon cancer, retired Dietitian   Other Topics Concern  . Not on file   Social History Narrative   Family History  Problem Relation Age of Onset  . Coronary artery disease Father 39  . Coronary artery disease Brother   . Aortic aneurysm Brother      Review of Systems  All other systems reviewed and are negative.      Objective:   Physical Exam  Constitutional: He is oriented to person, place, and time. He appears well-developed and well-nourished. No distress.  HENT:  Head: Normocephalic and atraumatic.  Right Ear: External ear normal.  Left Ear: External ear normal.  Nose: Nose normal.  Mouth/Throat: Oropharynx is clear and moist. No oropharyngeal exudate.  Eyes: Conjunctivae and  EOM are normal. Pupils are equal, round, and reactive to light. Right eye exhibits no discharge. Left eye exhibits no discharge. No scleral icterus.  Neck: Normal range of motion. Neck supple. No JVD present. No tracheal deviation present. No thyromegaly present.  Cardiovascular: Normal rate, regular rhythm, normal heart sounds and intact distal pulses.  Exam reveals no gallop and no friction rub.   No murmur heard. Pulmonary/Chest: Effort normal and breath sounds normal. No stridor. No respiratory distress. He has no wheezes. He has no rales. He exhibits no tenderness.  Abdominal: Soft. Bowel sounds are normal. He exhibits no distension and no mass. There is no tenderness. There is no rebound and no guarding.  Genitourinary: Penis normal. No penile tenderness.  Musculoskeletal: Normal range of motion. He exhibits no edema or tenderness.  Lymphadenopathy:    He has no cervical adenopathy.  Neurological: He is alert and oriented to person, place, and time. He has normal reflexes. He displays normal reflexes. No cranial nerve deficit. He exhibits normal muscle tone. Coordination normal.  Skin: Skin is warm. No rash noted. He is not diaphoretic. No erythema. No pallor.  Psychiatric: He has a normal mood and affect. His behavior is normal. Judgment and thought content normal.  Vitals reviewed.         Assessment & Plan:  Routine general medical examination at a health care facility - Plan: fluticasone (FLONASE) 50 MCG/ACT nasal spray  Physical exam is completely normal. Patient has gained some weight since last year. He attributes this to his new marriage. Overall his quality of life is much better. He is very happy. He is exercising every day. He admits that he is eating poorly and he will correct that on his iron. He knows very well the diet that he should be eating. We both agreed just to monitor his eosinophils. At the present time I think a bone marrow biopsy would be overtreatment for a mild  asymptomatic eosinophilia. Therefore we will clinically monitor this. Given his age, his vitality, and his quality of life, the patient would like to proceed with colonoscopy. The remainder of his lab work is excellent. We did discuss a healthy well-balanced diet. He is already exercising regularly.Marland Kitchen

## 2014-03-24 ENCOUNTER — Encounter: Payer: Self-pay | Admitting: Internal Medicine

## 2014-03-30 ENCOUNTER — Other Ambulatory Visit: Payer: Self-pay | Admitting: Family Medicine

## 2014-03-30 MED ORDER — AMLODIPINE BESY-BENAZEPRIL HCL 5-20 MG PO CAPS: 1.0000 | ORAL_CAPSULE | Freq: Every day | ORAL | Status: DC

## 2014-03-30 NOTE — Telephone Encounter (Signed)
Med sent to pharm 

## 2014-03-31 ENCOUNTER — Telehealth: Payer: Self-pay | Admitting: *Deleted

## 2014-03-31 ENCOUNTER — Ambulatory Visit (AMBULATORY_SURGERY_CENTER): Payer: Medicare Other | Admitting: *Deleted

## 2014-03-31 VITALS — Ht 67.5 in | Wt 194.0 lb

## 2014-03-31 DIAGNOSIS — Z1211 Encounter for screening for malignant neoplasm of colon: Secondary | ICD-10-CM

## 2014-03-31 MED ORDER — MOVIPREP 100 G PO SOLR: 1.0000 | Freq: Once | ORAL | Status: DC

## 2014-03-31 NOTE — Telephone Encounter (Signed)
Dr. Olevia Perches,  [name] is a 81 year old patient who last had a colonoscopy 10+ years ago in [place]. He is scheduled for screening colonscopy Friday 04/16/14, okay to proceed with direct or office visit needed?

## 2014-03-31 NOTE — Telephone Encounter (Signed)
OK to schedule direct colonodcopy

## 2014-03-31 NOTE — Progress Notes (Signed)
Denies allergies to eggs or soy products. Denies complications with sedation or anesthesia. Denies O2 use. Denies use of diet or weight loss medications.  Emmi instructions given for colonoscopy.  

## 2014-04-04 ENCOUNTER — Other Ambulatory Visit: Payer: Self-pay | Admitting: Family Medicine

## 2014-04-12 ENCOUNTER — Telehealth: Payer: Self-pay | Admitting: Family Medicine

## 2014-04-12 NOTE — Telephone Encounter (Signed)
To my knowledge insurance will not cover ED meds for ED or BPH - the only thing it will cover for is possibly pulmonary hypotension. He has Medicare. He could get the generic at [facility] if he wants to do that???

## 2014-04-12 NOTE — Telephone Encounter (Signed)
[phone]  Pt states that his insurance will not cover (what sounds like) BTH or BPH medication and that Dr Dennard Schaumann was aware that might happen and he would write a letter so it could be covered. The pt wanted to make him aware so he could get his medication.

## 2014-04-12 NOTE — Telephone Encounter (Signed)
Sildenafil 20 mg 3-5 tabs poqday prn ed to [facility] drug (that's all I know to do).

## 2014-04-14 NOTE — Telephone Encounter (Signed)
Spoke to pt and per pt his insurance did cover his medication last year - PA submitted to [employer] through [email or web address]

## 2014-04-16 ENCOUNTER — Ambulatory Visit (AMBULATORY_SURGERY_CENTER): Payer: Medicare Other | Admitting: Internal Medicine

## 2014-04-16 ENCOUNTER — Encounter: Payer: Self-pay | Admitting: Internal Medicine

## 2014-04-16 VITALS — BP 144/74 | HR 60 | Temp 97.8°F | Resp 19 | Ht 67.5 in | Wt 194.0 lb

## 2014-04-16 DIAGNOSIS — D125 Benign neoplasm of sigmoid colon: Secondary | ICD-10-CM | POA: Diagnosis not present

## 2014-04-16 DIAGNOSIS — D122 Benign neoplasm of ascending colon: Secondary | ICD-10-CM

## 2014-04-16 DIAGNOSIS — Z1211 Encounter for screening for malignant neoplasm of colon: Secondary | ICD-10-CM

## 2014-04-16 DIAGNOSIS — K635 Polyp of colon: Secondary | ICD-10-CM

## 2014-04-16 MED ORDER — SODIUM CHLORIDE 0.9 % IV SOLN: 500.0000 mL | INTRAVENOUS | Status: DC

## 2014-04-16 NOTE — Progress Notes (Signed)
Called to room to assist during endoscopic procedure.  Patient ID and intended procedure confirmed with present staff. Received instructions for my participation in the procedure from the performing physician.  

## 2014-04-16 NOTE — Patient Instructions (Signed)
YOU HAD AN ENDOSCOPIC PROCEDURE TODAY AT [facility]:   Refer to the procedure report that was given to you for any specific questions about what was found during the examination.  If the procedure report does not answer your questions, please call your gastroenterologist to clarify.  If you requested that your care partner not be given the details of your procedure findings, then the procedure report has been included in a sealed envelope for you to review at your convenience later.  YOU SHOULD EXPECT: Some feelings of bloating in the abdomen. Passage of more gas than usual.  Walking can help get rid of the air that was put into your GI tract during the procedure and reduce the bloating. If you had a lower endoscopy (such as a colonoscopy or flexible sigmoidoscopy) you may notice spotting of blood in your stool or on the toilet paper. If you underwent a bowel prep for your procedure, you may not have a normal bowel movement for a few days.  Please Note:  You might notice some irritation and congestion in your nose or some drainage.  This is from the oxygen used during your procedure.  There is no need for concern and it should clear up in a day or so.  SYMPTOMS TO REPORT IMMEDIATELY:   Following lower endoscopy (colonoscopy or flexible sigmoidoscopy):  Excessive amounts of blood in the stool  Significant tenderness or worsening of abdominal pains  Swelling of the abdomen that is new, acute  Fever of 100F or higher   Following upper endoscopy (EGD)  Vomiting of blood or coffee ground material  New chest pain or pain under the shoulder blades  Painful or persistently difficult swallowing  New shortness of breath  Fever of 100F or higher  Black, tarry-looking stools  For urgent or emergent issues, a gastroenterologist can be reached at any hour by calling [phone].   DIET: Your first meal following the procedure should be a small meal and then it is ok to progress to  your normal diet. Heavy or fried foods are harder to digest and may make you feel nauseous or bloated.  Likewise, meals heavy in dairy and vegetables can increase bloating.  Drink plenty of fluids but you should avoid alcoholic beverages for 24 hours. Try to increase the fiber in your diet. ACTIVITY:  You should plan to take it easy for the rest of today and you should NOT DRIVE or use heavy machinery until tomorrow (because of the sedation medicines used during the test).    FOLLOW UP: Our staff will call the number listed on your records the next business day following your procedure to check on you and address any questions or concerns that you may have regarding the information given to you following your procedure. If we do not reach you, we will leave a message.  However, if you are feeling well and you are not experiencing any problems, there is no need to return our call.  We will assume that you have returned to your regular daily activities without incident.  If any biopsies were taken you will be contacted by phone or by letter within the next 1-3 weeks.  Please call us at [phone] if you have not heard about the biopsies in 3 weeks.    SIGNATURES/CONFIDENTIALITY: You and/or your care partner have signed paperwork which will be entered into your electronic medical record.  These signatures attest to the fact that that the information above on  your After Visit Summary has been reviewed and is understood.  Full responsibility of the confidentiality of this discharge information lies with you and/or your care-partner.  NO ASPIRIN NOR NSAIDS FOR TWO WEEKS PER DR. Olevia Perches.

## 2014-04-16 NOTE — Progress Notes (Signed)
To recovery, report to [place], RN, VSS 

## 2014-04-16 NOTE — Op Note (Addendum)
[facility]  [address] [place], [id number]   COLONOSCOPY PROCEDURE REPORT  PATIENT: [name], [name]  MR#: [id number] BIRTHDATE: Aug 18, 1933 , 81  yrs. old GENDER: male ENDOSCOPIST: Lafayette Dragon, MD REFERRED LZ:JQBHAL Dennard Schaumann, M.D. PROCEDURE DATE:  04/16/2014 PROCEDURE:   Colonoscopy, screening, Colonoscopy with snare polypectomy, and Colonoscopy with cold biopsy polypectomy First Screening Colonoscopy - Avg.  risk and is 50 yrs.  old or older Yes.  Prior Negative Screening - Now for repeat screening. N/A  History of Adenoma - Now for follow-up colonoscopy & has been > or = to 3 yrs.  N/A ASA CLASS:   Class III INDICATIONS:Screening for colonic neoplasia and Colorectal Neoplasm Risk Assessment for this procedure is average risk.  prior colonoscopy at age 81 and 17 in another facility. Records not available MEDICATIONS: Monitored anesthesia care and Propofol 350 mg IV  DESCRIPTION OF PROCEDURE:   After the risks benefits and alternatives of the procedure were thoroughly explained, informed consent was obtained.  The digital rectal exam revealed no abnormalities of the rectum.   The LB [id number]  endoscope was introduced through the anus and advanced to the cecum, which was identified by both the appendix and ileocecal valve. No adverse events experienced.   The quality of the prep was good.  (MoviPrep was used)  The instrument was then slowly withdrawn as the colon was fully examined.      COLON FINDINGS: Three sessile polyps measuring 7 mm in size were found in the ascending colon and sigmoid colon.  A polypectomy was performed with cold forceps.  The resection was complete, the polyp tissue was completely retrieved and sent to histology.  A polypectomy was performed with a cold snare.  The resection was complete, the polyp tissue was completely retrieved and sent to histology.   There was moderate diverticulosis noted throughout the entire  examined colon with associated muscular hypertrophy, tortuosity and angulation.   Small internal Grade I hemorrhoids were found.  Retroflexed views revealed no abnormalities. The time to cecum = 8 08 Withdrawal time = 12.45   The scope was withdrawn and the procedure completed. COMPLICATIONS: There were no immediate complications.  ENDOSCOPIC IMPRESSION: 1.   Three sessile polyps were found in the ascending colon x2  ( 7 mm and 4 mm)and sigmoid colon x1 polypectomy was performed with cold forceps in sigmoid colon ; polypectomy was performed with a cold snare in the ascending colon 2.   There was moderate diverticulosis noted throughout the entire examined colon 3.   Small internal Grade I hemorrhoids  RECOMMENDATIONS: 1.  Await pathology results 2.  No aspirin for 2 weeks High-fiber diet No recall colonoscopy due to age  eSigned:  Lafayette Dragon, MD 04/16/2014 8:48 AM Revised: 04/16/2014 8:48 AM  cc:   PATIENT NAME:  [name], [name] MR#: [id number]

## 2014-04-16 NOTE — Progress Notes (Signed)
Patient may be discharged in 20 minutes per Dr. Olevia Perches.

## 2014-04-17 ENCOUNTER — Other Ambulatory Visit: Payer: Self-pay | Admitting: Family Medicine

## 2014-04-19 ENCOUNTER — Telehealth: Payer: Self-pay | Admitting: *Deleted

## 2014-04-19 NOTE — Telephone Encounter (Signed)
Left message on f/u call 

## 2014-04-20 ENCOUNTER — Encounter: Payer: Self-pay | Admitting: Internal Medicine

## 2014-04-22 MED ORDER — TADALAFIL 5 MG PO TABS: ORAL_TABLET | ORAL | Status: DC

## 2014-04-22 NOTE — Telephone Encounter (Signed)
Approved through 04/16/15  -- pt and pharm aware

## 2014-09-11 ENCOUNTER — Other Ambulatory Visit: Payer: Self-pay | Admitting: Family Medicine

## 2014-09-24 ENCOUNTER — Encounter: Payer: Self-pay | Admitting: Cardiovascular Disease

## 2014-09-24 ENCOUNTER — Ambulatory Visit (INDEPENDENT_AMBULATORY_CARE_PROVIDER_SITE_OTHER): Payer: Medicare Other | Admitting: Cardiovascular Disease

## 2014-09-24 VITALS — BP 180/90 | HR 68 | Ht 66.75 in | Wt 204.0 lb

## 2014-09-24 DIAGNOSIS — I2510 Atherosclerotic heart disease of native coronary artery without angina pectoris: Secondary | ICD-10-CM | POA: Diagnosis not present

## 2014-09-24 DIAGNOSIS — I10 Essential (primary) hypertension: Secondary | ICD-10-CM

## 2014-09-24 MED ORDER — AMLODIPINE BESY-BENAZEPRIL HCL 5-20 MG PO CAPS: 1.0000 | ORAL_CAPSULE | Freq: Two times a day (BID) | ORAL | Status: DC

## 2014-09-24 MED ORDER — SIMVASTATIN 20 MG PO TABS: 20.0000 mg | ORAL_TABLET | Freq: Every day | ORAL | Status: DC

## 2014-09-24 NOTE — Patient Instructions (Addendum)
Medication Instructions:  Your physician has recommended you make the following change in your medication:  1. INCREASE Lotrel 5/20mg  take one by mouth twice a day 2. DECREASE Simvastatin to 20mg  take one by mouth every evening  Labwork: No new orders.   Testing/Procedures: No new orders.   Follow-Up: Your physician recommends that you schedule a follow-up appointment in: 2 WEEKS for a Nurse Visit to check your BP. Please bring home cuff and BP readings.   Your physician wants you to follow-up in: 1 YEAR with Dr [name].  You will receive a reminder letter in the mail two months in advance. If you don't receive a letter, please call our office to schedule the follow-up appointment.  Any Other Special Instructions Will Be Listed Below (If Applicable).  Low-Sodium Eating Plan Sodium raises blood pressure and causes water to be held in the body. Getting less sodium from food will help lower your blood pressure, reduce any swelling, and protect your heart, liver, and kidneys. We get sodium by adding salt (sodium chloride) to food. Most of our sodium comes from canned, boxed, and frozen foods. Restaurant foods, fast foods, and pizza are also very high in sodium. Even if you take medicine to lower your blood pressure or to reduce fluid in your body, getting less sodium from your food is important. WHAT IS MY PLAN? Most people should limit their sodium intake to 2,300 mg a day. Your health care provider recommends that you limit your sodium intake to 2,000 mg a day.  WHAT DO I NEED TO KNOW ABOUT THIS EATING PLAN? For the low-sodium eating plan, you will follow these general guidelines:  Choose foods with a % Daily Value for sodium of less than 5% (as listed on the food label).   Use salt-free seasonings or herbs instead of table salt or sea salt.   Check with your health care provider or pharmacist before using salt substitutes.   Eat fresh foods.  Eat more vegetables and fruits.  Limit  canned vegetables. If you do use them, rinse them well to decrease the sodium.   Limit cheese to 1 oz (28 g) per day.   Eat lower-sodium products, often labeled as "lower sodium" or "no salt added."  Avoid foods that contain monosodium glutamate (MSG). MSG is sometimes added to [place] food and some canned foods.  Check food labels (Nutrition Facts labels) on foods to learn how much sodium is in one serving.  Eat more home-cooked food and less restaurant, buffet, and fast food.  When eating at a restaurant, ask that your food be prepared with less salt or none, if possible.  HOW DO I READ FOOD LABELS FOR SODIUM INFORMATION? The Nutrition Facts label lists the amount of sodium in one serving of the food. If you eat more than one serving, you must multiply the listed amount of sodium by the number of servings. Food labels may also identify foods as:  Sodium free--Less than 5 mg in a serving.  Very low sodium--35 mg or less in a serving.  Low sodium--140 mg or less in a serving.  Light in sodium--50% less sodium in a serving. For example, if a food that usually has 300 mg of sodium is changed to become light in sodium, it will have 150 mg of sodium.  Reduced sodium--25% less sodium in a serving. For example, if a food that usually has 400 mg of sodium is changed to reduced sodium, it will have 300 mg of sodium. WHAT  FOODS CAN I EAT? Grains Low-sodium cereals, including oats, puffed wheat and rice, and shredded wheat cereals. Low-sodium crackers. Unsalted rice and pasta. Lower-sodium bread.  Vegetables Frozen or fresh vegetables. Low-sodium or reduced-sodium canned vegetables. Low-sodium or reduced-sodium tomato sauce and paste. Low-sodium or reduced-sodium tomato and vegetable juices.  Fruits Fresh, frozen, and canned fruit. Fruit juice.  Meat and Other Protein Products Low-sodium canned tuna and salmon. Fresh or frozen meat, poultry, seafood, and fish. Lamb. Unsalted nuts.  Dried beans, peas, and lentils without added salt. Unsalted canned beans. Homemade soups without salt. Eggs.  Dairy Milk. Soy milk. Ricotta cheese. Low-sodium or reduced-sodium cheeses. Yogurt.  Condiments Fresh and dried herbs and spices. Salt-free seasonings. Onion and garlic powders. Low-sodium varieties of mustard and ketchup. Lemon juice.  Fats and Oils Reduced-sodium salad dressings. Unsalted butter.  Other Unsalted popcorn and pretzels.  The items listed above may not be a complete list of recommended foods or beverages. Contact your dietitian for more options. WHAT FOODS ARE NOT RECOMMENDED? Grains Instant hot cereals. Bread stuffing, pancake, and biscuit mixes. Croutons. Seasoned rice or pasta mixes. Noodle soup cups. Boxed or frozen macaroni and cheese. Self-rising flour. Regular salted crackers. Vegetables Regular canned vegetables. Regular canned tomato sauce and paste. Regular tomato and vegetable juices. Frozen vegetables in sauces. Salted french fries. Olives. Angie Fava. Relishes. Sauerkraut. Salsa. Meat and Other Protein Products Salted, canned, smoked, spiced, or pickled meats, seafood, or fish. Bacon, ham, sausage, hot dogs, corned beef, chipped beef, and packaged luncheon meats. Salt pork. Jerky. Pickled herring. Anchovies, regular canned tuna, and sardines. Salted nuts. Dairy Processed cheese and cheese spreads. Cheese curds. Blue cheese and cottage cheese. Buttermilk.  Condiments Onion and garlic salt, seasoned salt, table salt, and sea salt. Canned and packaged gravies. Worcestershire sauce. Tartar sauce. Barbecue sauce. Teriyaki sauce. Soy sauce, including reduced sodium. Steak sauce. Fish sauce. Oyster sauce. Cocktail sauce. Horseradish. Regular ketchup and mustard. Meat flavorings and tenderizers. Bouillon cubes. Hot sauce. Tabasco sauce. Marinades. Taco seasonings. Relishes. Fats and Oils Regular salad dressings. Salted butter. Margarine. Ghee. Bacon fat.    Other Potato and tortilla chips. Corn chips and puffs. Salted popcorn and pretzels. Canned or dried soups. Pizza. Frozen entrees and pot pies.  The items listed above may not be a complete list of foods and beverages to avoid. Contact your dietitian for more information. Document Released: 07/14/2001 Document Revised: 01/27/2013 Document Reviewed: 11/26/2012 [facility] Patient Information 2015 [place], [place]. This information is not intended to replace advice given to you by your health care provider. Make sure you discuss any questions you have with your health care provider.

## 2014-09-24 NOTE — Progress Notes (Signed)
Cardiology Office Note Date:  09/24/2014   ID:  [name], DOB 27-Dec-1933, MRN [id number]  PCP:  Odette Fraction, MD  Cardiologist:  Sherren Mocha, MD    Chief Complaint  Patient presents with  . Coronary Artery Disease     History of Present Illness: [name] is a 81 y.o. male who presents for follow-up evaluation. The patient has been followed for coronary artery disease, hypertension, and hyperlipidemia. The patient underwent PCI (Cypher DES) of the proximal LAD in 2008. He had moderate diffuse residual CAD and medical therapy was recommended. The patient had an exercise treadmill study in 2014 and this demonstrated no significant ST/T changes.  He notes BP is elevated today but states he was awake most of the night, having a lot of family stress. He remarried one year ago and is having difficult with family accepting his wife. Also has gained 20-30 pounds over the past 2 years. He continues to workout regularly and has no exertional symptoms associated with exercise. Specifically denies chest pain or dyspnea.   Past Medical History  Diagnosis Date  . CAD (coronary artery disease)     s/p cypher DES to pLAD 6/08; normal LVF;  ETT-Myoview 2009: no ischemia   . Hyperlipidemia   . HTN (hypertension)   . Allergy   . Myocardial infarction   . BPH (benign prostatic hypertrophy)   . Eosinophilia     Past Surgical History  Procedure Laterality Date  . Exploratory laparotomy      age 41  . Appendectomy    . Cardiac catheterization  07/30/2006    CORONARY ANGIOPLASTY WITH STENT PLACEMENT    Current Outpatient Prescriptions  Medication Sig Dispense Refill  . amLODipine-benazepril (LOTREL) 5-20 MG per capsule Take 1 capsule by mouth 2 (two) times daily. 180 capsule 3  . aspirin EC 81 MG tablet Take 1 tablet (81 mg total) by mouth daily.    . Calcium Carbonate-Vitamin D (CALCARB 600/D) 600-400 MG-UNIT per tablet Take by mouth daily.      Marland Kitchen CLINPRO 5000 1.1 %  PSTE BRUSH WITH A PEA SIZED AMOUNT FOR 2 MINUTES, RINSE AND SPIT *DO NOT EAT/DRINK FOR 30 MIN* daily  6  . Coenzyme Q10 (CO Q-10) 300 MG CAPS Take 100 mg by mouth daily.     Marland Kitchen DHEA 50 MG CAPS Take 1 capsule by mouth daily with breakfast. T-Boost    . fluticasone (FLONASE) 50 MCG/ACT nasal spray Place 2 sprays into both nostrils daily. 16 g 6  . Fluticasone-Salmeterol (ADVAIR DISKUS) 250-50 MCG/DOSE AEPB Inhale 1 puff into the lungs 2 (two) times daily. 1 each 3  . ibuprofen (ADVIL,MOTRIN) 600 MG tablet Take 1 tablet (600 mg total) by mouth every 6 (six) hours as needed for pain. 30 tablet 1  . Multiple Vitamin (MULTIVITAMIN) capsule Take 1 capsule by mouth daily. Resetigen-D  Cellular rejuvenation formula    . NON FORMULARY Omega Q 1 tab twice daily    . OVER THE COUNTER MEDICATION Take 1 tablet by mouth daily. Juvenon  Cellular health supplement    . simvastatin (ZOCOR) 20 MG tablet Take 1 tablet (20 mg total) by mouth at bedtime. 90 tablet 3  . tadalafil (CIALIS) 5 MG tablet TAKE 1 TABLET BY MOUTH DAILY AS NEEDED. (Patient taking differently: Take 5 mg by mouth daily as needed for erectile dysfunction. TAKE 1 TABLET BY MOUTH DAILY AS NEEDED.) 30 tablet 11  . tamsulosin (FLOMAX) 0.4 MG CAPS capsule TAKE 1 CAPSULE (  0.4 MG TOTAL) BY MOUTH DAILY. 90 capsule 3  . VENTOLIN HFA 108 (90 BASE) MCG/ACT inhaler INHALE 2 PUFFS INTO THE LUNGS EVERY 6 (SIX) HOURS AS NEEDED FOR WHEEZING OR SHORTNESS OF BREATH. 18 g 3   No current facility-administered medications for this visit.    Allergies:   Review of patient's allergies indicates no known allergies.   Social History:  The patient  reports that he has quit smoking. He has never used smokeless tobacco. He reports that he drinks about 2.4 - 3.6 oz of alcohol per week. He reports that he does not use illicit drugs.   Family History:  The patient's  family history includes Aortic aneurysm in his brother; Colon cancer in his brother; Coronary artery disease  in his brother; Coronary artery disease (age of onset: 36) in his father; Stomach cancer in his mother. There is no history of Esophageal cancer or Rectal cancer.   ROS:  Please see the history of present illness. All other systems are reviewed and negative.   PHYSICAL EXAM: VS:  BP 180/90 mmHg  Pulse 68  Ht 5' 6.75" (1.695 m)  Wt 204 lb (92.534 kg)  BMI 32.21 kg/m2 , BMI Body mass index is 32.21 kg/(m^2). GEN: Well nourished, well developed, pleasant elderly male in no acute distress HEENT: normal Neck: no JVD, no masses. No carotid bruits Cardiac: RRR without murmur or gallop                Respiratory:  clear to auscultation bilaterally, normal work of breathing GI: soft, nontender, nondistended, + BS MS: no deformity or atrophy Ext: no pretibial edema, pedal pulses 2+= bilaterally Skin: warm and dry, no rash Neuro:  Strength and sensation are intact Psych: euthymic mood, full affect  EKG:  EKG is ordered today. The ekg ordered today shows NSR with occasional PVC's, left axis deviation  Recent Labs: 03/19/2014: ALT 15; BUN 26*; Creat 1.27; Hemoglobin 13.0; Platelets 215; Potassium 4.4; Sodium 140; TSH 1.385   Lipid Panel     Component Value Date/Time   CHOL 157 03/19/2014 0810   TRIG 60 03/19/2014 0810   HDL 59 03/19/2014 0810   CHOLHDL 2.7 03/19/2014 0810   VLDL 12 03/19/2014 0810   LDLCALC 86 03/19/2014 0810      Wt Readings from Last 3 Encounters:  09/24/14 204 lb (92.534 kg)  04/16/14 194 lb (87.998 kg)  03/31/14 194 lb (87.998 kg)    ASSESSMENT AND PLAN: 1.  Essential hypertension, uncontrolled: Extensive lifestyle changes discussed. He is adding a lot of salt to his food. He understands the importance of sodium restriction. He will work on weight loss as he has gained significant weight over the past 2 years. I reviewed his medications and will double his amlodipine/benazepril to twice daily dosing.  2. Coronary artery disease, native vessel: The patient is  stable without symptoms of angina. He is on appropriate medical therapy. We'll continue the same and see him back in one year.  3. Hyperlipidemia: Lifestyle modification reviewed. Will reduce simvastatin 20 mg because of increase in his amlodipine dosage. Followed by primary care.  Current medicines are reviewed with the patient today.  The patient does not have concerns regarding medicines.  Labs/ tests ordered today include:   Orders Placed This Encounter  Procedures  . EKG 12-Lead    Disposition:   FU one year  Signed, Sherren Mocha, MD  09/24/2014 12:12 PM    [address],  [place], [place]  [address] Phone: [phone]; Fax: [phone]

## 2014-10-07 ENCOUNTER — Ambulatory Visit (INDEPENDENT_AMBULATORY_CARE_PROVIDER_SITE_OTHER): Payer: Medicare Other

## 2014-10-07 VITALS — BP 174/74 | HR 66 | Wt 201.8 lb

## 2014-10-07 DIAGNOSIS — I10 Essential (primary) hypertension: Secondary | ICD-10-CM | POA: Diagnosis not present

## 2014-10-07 NOTE — Patient Instructions (Signed)
Medication Instructions:  Your physician recommends that you continue on your current medications as directed. Please refer to the Current Medication list given to you today.  Labwork: No new orders.  Testing/Procedures: No new orders.   Follow-Up: Your physician wants you to follow-up in: 1 YEAR with Dr [name].  You will receive a reminder letter in the mail two months in advance. If you don't receive a letter, please call our office to schedule the follow-up appointment.  Dr [name] will review your BP readings from home and make further recommendations as needed.  My Chart HELP desk [id number].   Any Other Special Instructions Will Be Listed Below (If Applicable).

## 2014-10-07 NOTE — Progress Notes (Signed)
The pt came into the office for BP check.  The pt did bring his home BP cuff and record of readings. The pt has made changes in salt intake and has lost 3 lbs.  BP by my check with large cuff (left arm) 174/74.  Pt's BP cuff 195/89.  The pt did take his medications this morning at 7:30 AM.  In reviewing BP's from home they are:  8/19 143/74 8/20 138/65 8/21 145/73 8/24 144/68 1 pm, 135/64 8 pm 8/25 144/71 7 am, 139/72 1 pm 8/26 127/70 7:30 am, 150/74 1:15 pm, 121/61 3:15 pm 8/27 167/71 6 am, 136/70 8 am, 414/64 11am, 119/61 3 pm 8/28 146/75 5:30am, 126/59 11am 8/29 146/76 6:30 am, 125/69 8 am, 126/63 2 pm, 123/69 5:10 pm 8/30 147/68 6:30 am, 125/62 8:15 am, 137/63 2 pm, 118/60 3 pm 8/31 153/67 7 am, 133/63 8 am, 116/69 2:30 pm 9/1 142/68 6:30am, 122/54 9 am   The pt's readings at home are better than what they appear in the office.  I asked the pt if he had ever been told that he may have white coat HTN and he said this has been mentioned in the past.  I advised the pt to continue current medications and encouraged continue weight loss and dietary changes.  The pt will begin monitoring his BP 2-3 times per week.  I will forward this note to Dr [name] for further review and recommendations.

## 2014-10-08 NOTE — Progress Notes (Signed)
Per Dr [name]:  Agree with monitoring BP and working on lifestyle modification     ----- Message -----     From: Barkley Boards, RN     Sent: 10/07/2014 11:21 AM      To: Sherren Mocha, MD    Pt aware to continue to monitor BP at home, make dietary changes and continue with weight loss. The pt will contact the office with any other questions or concerns.

## 2014-10-21 ENCOUNTER — Telehealth: Payer: Self-pay | Admitting: *Deleted

## 2014-10-21 DIAGNOSIS — I714 Abdominal aortic aneurysm, without rupture, unspecified: Secondary | ICD-10-CM

## 2014-10-21 NOTE — Telephone Encounter (Signed)
Pt called stating that he is due for follow up on US aorta, says this is the 3rd one and should be the last one.  Ok to place referral for US aorta?

## 2014-10-21 NOTE — Telephone Encounter (Signed)
ok 

## 2014-10-22 NOTE — Telephone Encounter (Signed)
Order placed for US

## 2014-10-26 ENCOUNTER — Ambulatory Visit
Admission: RE | Admit: 2014-10-26 | Discharge: 2014-10-26 | Disposition: A | Discharge status: Home / Self Care | Payer: Medicare Other | Source: Ambulatory Visit | Attending: Family Medicine | Admitting: Family Medicine

## 2014-10-26 DIAGNOSIS — I714 Abdominal aortic aneurysm, without rupture, unspecified: Secondary | ICD-10-CM

## 2014-10-26 IMAGING — US US AORTA
1 series · 13 of 13 positions shown · non-contrast
Comparison: [DATE].

CLINICAL DATA: Followup previously reported abdominal aortic
aneurysm.

EXAM:
ULTRASOUND OF ABDOMINAL AORTA
TECHNIQUE: Ultrasound examination of the abdominal aorta was performed to
evaluate for abdominal aortic aneurysm.

[Series 1: us aorta · 0.44mm/px · 13 of 13 slices shown]
[im 1/13]
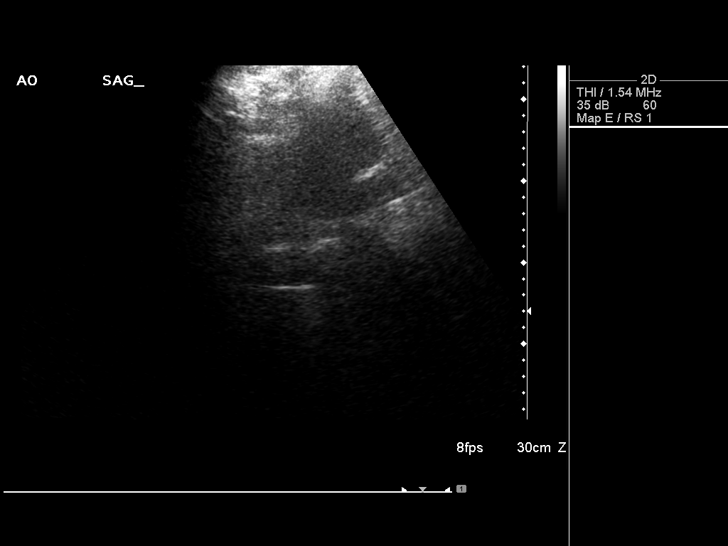
[im 2/13]
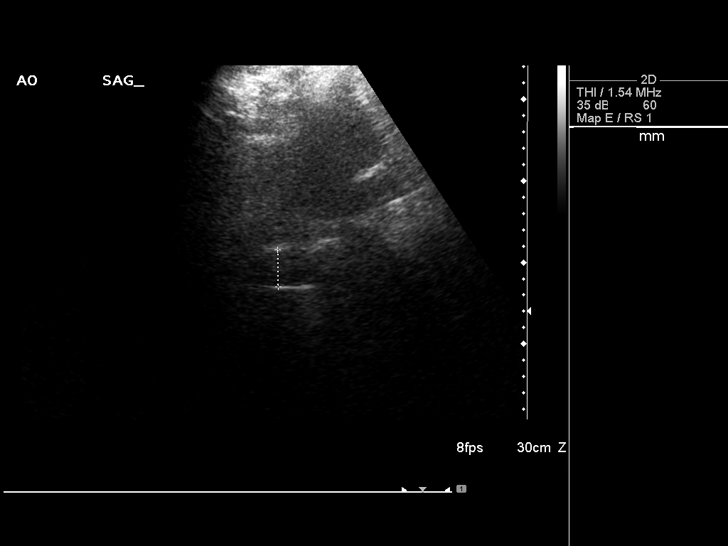
[im 3/13]
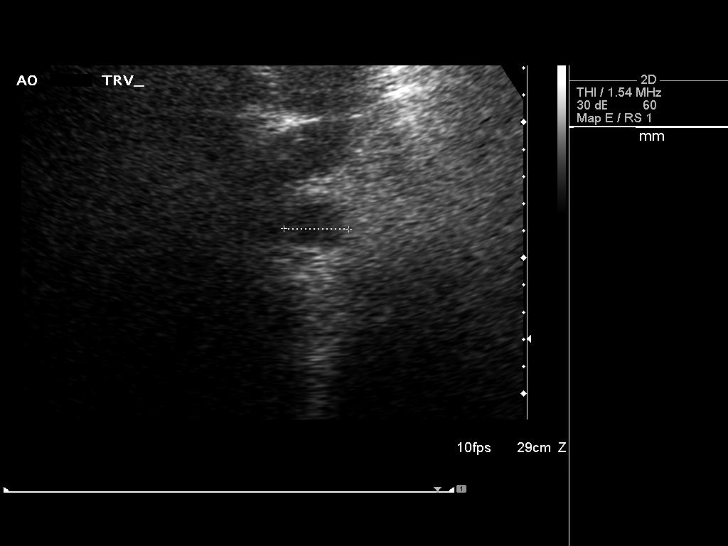
[im 4/13]
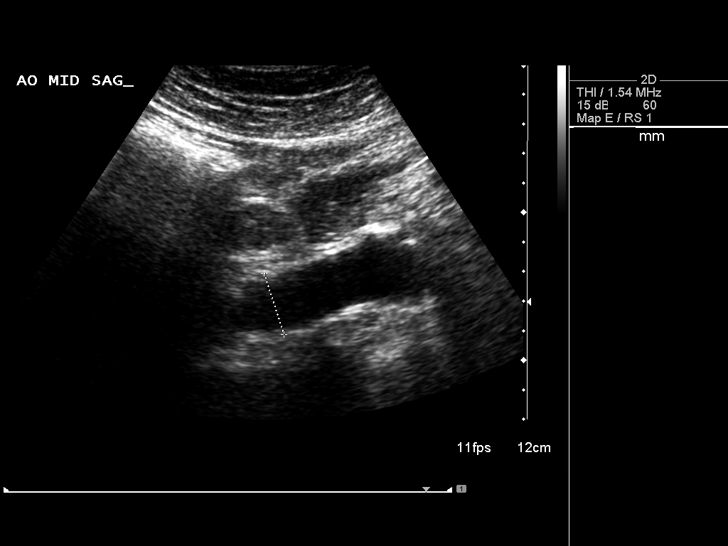
[im 5/13]
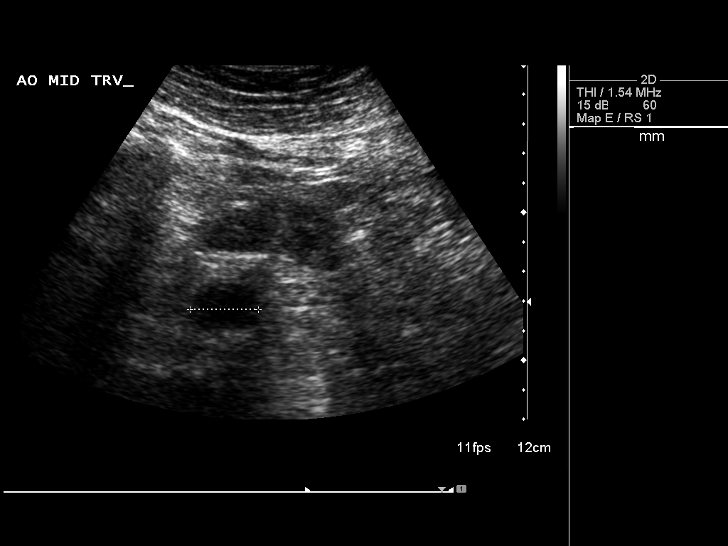
[im 6/13]
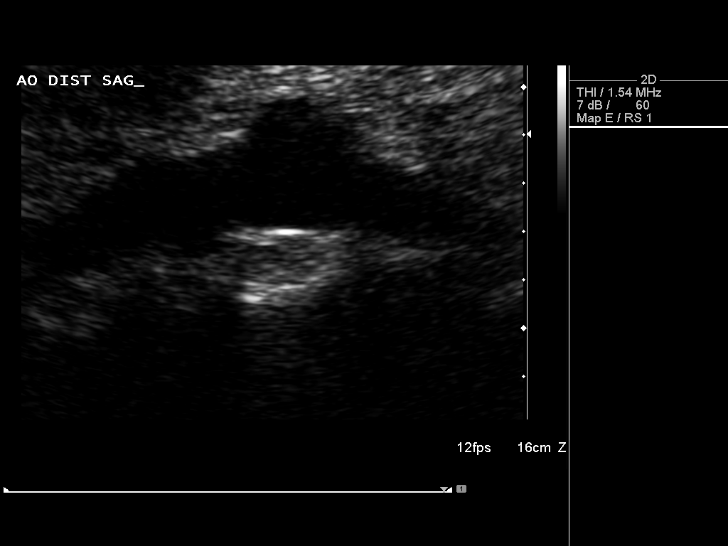
[im 7/13]
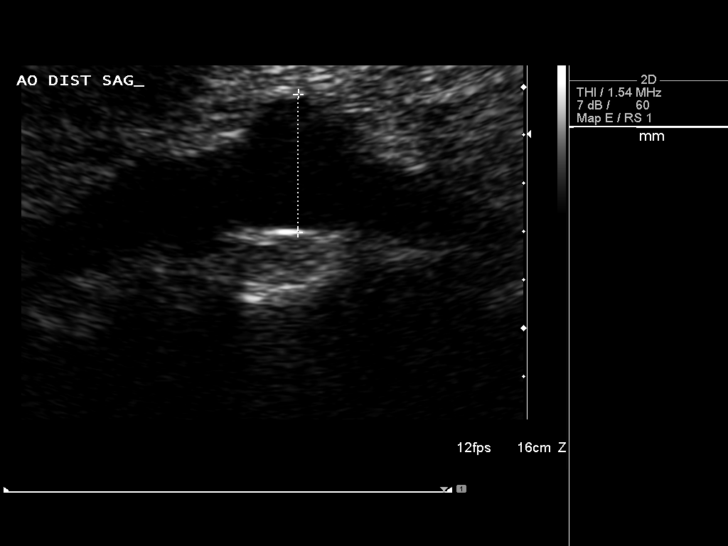
[im 8/13]
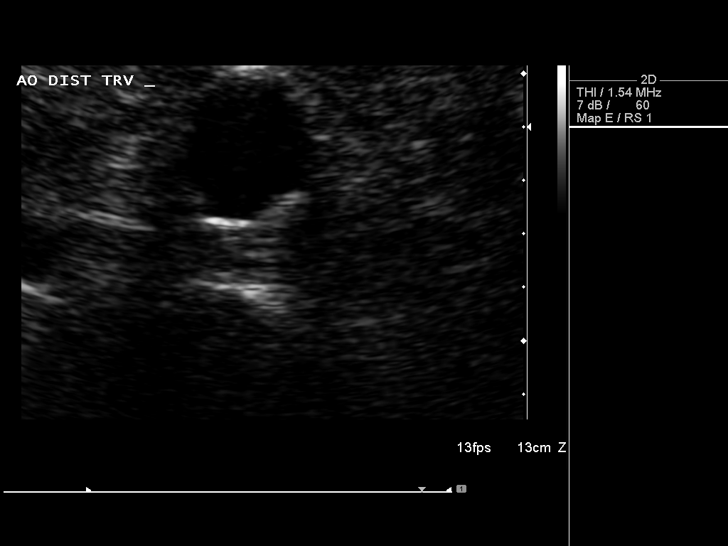
[im 9/13]
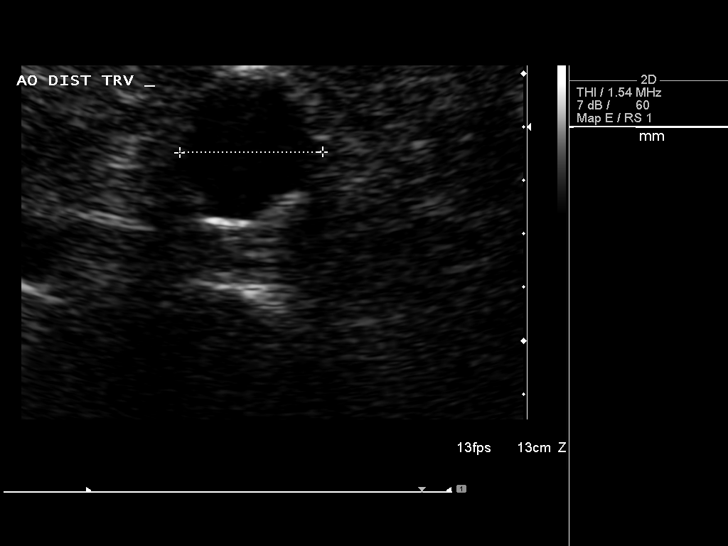
[im 10/13]
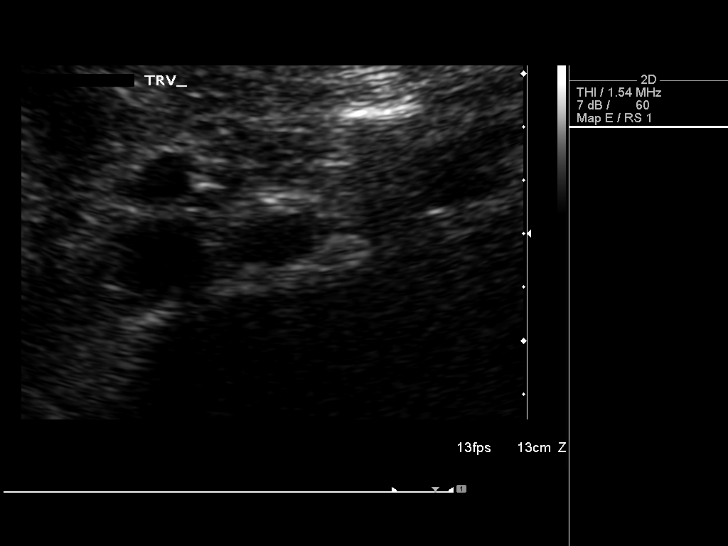
[im 11/13]
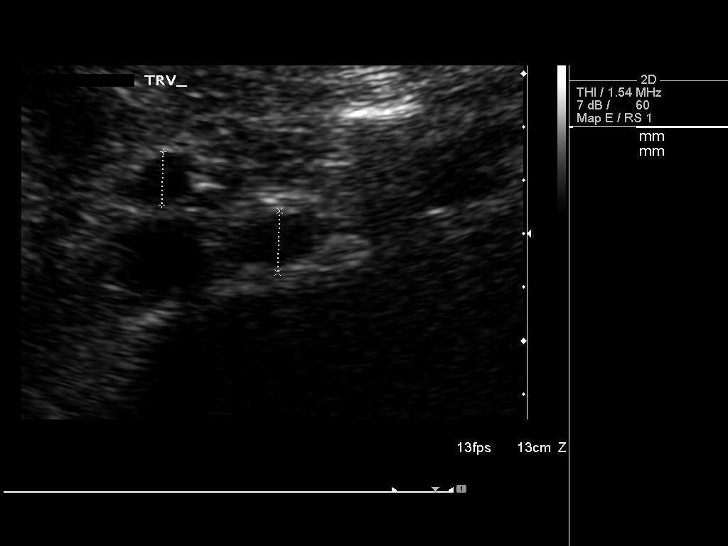
[im 12/13]
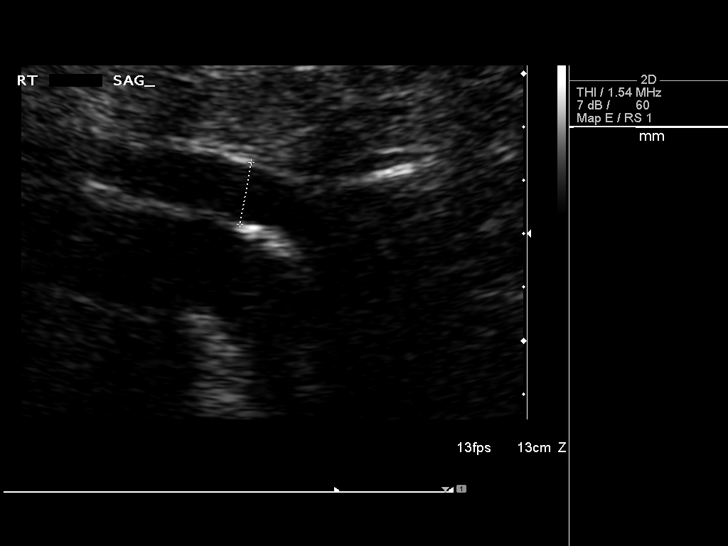
[im 13/13]
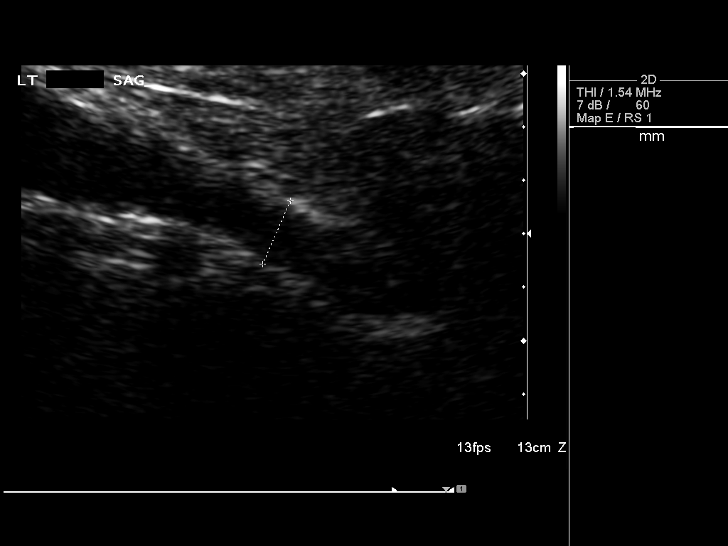

[13 of 13 positions shown; findings below may reference images not displayed]

FINDINGS: Abdominal Aorta

Focal ectasia of the distal abdominal aorta is again demonstrated
with a maximum AP diameter of 2.9 cm in maximum transverse diameter
of 2.7 cm. The remainder of the aorta is normal in caliber.

Maximum Diameter: 2.9 cm
IMPRESSION: Focally ectatic abdominal aorta with a maximum diameter of 2.9 cm,
at risk for aneurysm development. Recommend followup by ultrasound
in 5 years. This recommendation follows ACR consensus guidelines:
White Paper of the ACR Incidental Findings Committee II on Vascular
Findings. [HOSPITAL] [A5]; [DATE].

## 2014-10-29 ENCOUNTER — Encounter: Payer: Self-pay | Admitting: Family Medicine

## 2014-11-11 ENCOUNTER — Ambulatory Visit (INDEPENDENT_AMBULATORY_CARE_PROVIDER_SITE_OTHER): Payer: Medicare Other | Admitting: *Deleted

## 2014-11-11 DIAGNOSIS — Z23 Encounter for immunization: Secondary | ICD-10-CM | POA: Diagnosis not present

## 2014-11-11 NOTE — Progress Notes (Signed)
Patient ID: [name], male   DOB: 10/18/1933, 81 y.o.   MRN: [id number]  Patient seen in office for Influenza Vaccination.   Tolerated IM administration well.   Immunization history updated.

## 2014-11-24 ENCOUNTER — Other Ambulatory Visit: Payer: Self-pay | Admitting: Family Medicine

## 2014-11-24 DIAGNOSIS — I2510 Atherosclerotic heart disease of native coronary artery without angina pectoris: Secondary | ICD-10-CM

## 2014-11-24 DIAGNOSIS — I10 Essential (primary) hypertension: Secondary | ICD-10-CM

## 2014-11-24 MED ORDER — AMLODIPINE BESY-BENAZEPRIL HCL 5-20 MG PO CAPS: 1.0000 | ORAL_CAPSULE | Freq: Two times a day (BID) | ORAL | Status: DC

## 2014-11-26 ENCOUNTER — Other Ambulatory Visit: Payer: Self-pay | Admitting: Family Medicine

## 2014-11-26 NOTE — Telephone Encounter (Signed)
Medication refilled per protocol. 

## 2015-02-28 ENCOUNTER — Encounter: Payer: Self-pay | Admitting: Family Medicine

## 2015-02-28 ENCOUNTER — Ambulatory Visit (INDEPENDENT_AMBULATORY_CARE_PROVIDER_SITE_OTHER): Payer: PPO | Admitting: Family Medicine

## 2015-02-28 VITALS — BP 178/78 | HR 68 | Temp 97.5°F | Resp 16 | Ht 67.0 in | Wt 207.0 lb

## 2015-02-28 DIAGNOSIS — E785 Hyperlipidemia, unspecified: Secondary | ICD-10-CM | POA: Diagnosis not present

## 2015-02-28 DIAGNOSIS — I10 Essential (primary) hypertension: Secondary | ICD-10-CM

## 2015-02-28 NOTE — Progress Notes (Signed)
Subjective:    Patient ID: [name], male    DOB: [birth year], 82 y.o.   MRN: [id number]  HPI Patient's blood pressure significantly elevated today at 178/78. I checked the number myself in both arms and repeated again in 10 minutes. However he states that his blood pressure at home has been much better between 120 and 130/80. He is checking his blood pressure everyday with his blood pressure cuff. He is not missing his medication. He denies any chest pain shortness of breath dyspnea on exertion or headache. He has gained a fair amount of weight due to dietary indiscretion but overall is doing very well. He is no longer taking Cialis as his insurance would not pay for it. Therefore he does report 3-4 episodes of nocturia every night. However he denies weak stream, dribbling, hesitancy. He denies any symptoms during the daytime Past Medical History  Diagnosis Date  . CAD (coronary artery disease)     s/p cypher DES to pLAD 6/08; normal LVF;  ETT-Myoview 2009: no ischemia   . Hyperlipidemia   . HTN (hypertension)   . Allergy   . Myocardial infarction ([place])   . BPH (benign prostatic hypertrophy)   . Eosinophilia    Past Surgical History  Procedure Laterality Date  . Exploratory laparotomy      age 54  . Appendectomy    . Cardiac catheterization  07/30/2006    CORONARY ANGIOPLASTY WITH STENT PLACEMENT   Current Outpatient Prescriptions on File Prior to Visit  Medication Sig Dispense Refill  . ADVAIR DISKUS 250-50 MCG/DOSE AEPB INHALE 1 PUFF INTO THE LUNGS 2 (TWO) TIMES DAILY. 60 each 5  . amLODipine-benazepril (LOTREL) 5-20 MG capsule Take 1 capsule by mouth 2 (two) times daily. 180 capsule 3  . aspirin EC 81 MG tablet Take 1 tablet (81 mg total) by mouth daily.    . Calcium Carbonate-Vitamin D (CALCARB 600/D) 600-400 MG-UNIT per tablet Take by mouth daily.      Marland Kitchen CLINPRO 5000 1.1 % PSTE BRUSH WITH A PEA SIZED AMOUNT FOR 2 MINUTES, RINSE AND SPIT *DO NOT EAT/DRINK FOR 30 MIN*  daily  6  . Coenzyme Q-10 100 MG capsule Take 100 mg by mouth daily.    Marland Kitchen DHEA 50 MG CAPS Take 1 capsule by mouth daily with breakfast. T-Boost    . fluticasone (FLONASE) 50 MCG/ACT nasal spray Place 2 sprays into both nostrils daily. 16 g 6  . ibuprofen (ADVIL,MOTRIN) 600 MG tablet Take 1 tablet (600 mg total) by mouth every 6 (six) hours as needed for pain. 30 tablet 1  . Multiple Vitamin (MULTIVITAMIN) capsule Take 1 capsule by mouth daily. Resetigen-D  Cellular rejuvenation formula    . NON FORMULARY Omega Q 1 tab twice daily    . OVER THE COUNTER MEDICATION Take 1 tablet by mouth daily. Juvenon  Cellular health supplement    . tamsulosin (FLOMAX) 0.4 MG CAPS capsule TAKE 1 CAPSULE (0.4 MG TOTAL) BY MOUTH DAILY. 90 capsule 3  . VENTOLIN HFA 108 (90 BASE) MCG/ACT inhaler INHALE 2 PUFFS INTO THE LUNGS EVERY 6 (SIX) HOURS AS NEEDED FOR WHEEZING OR SHORTNESS OF BREATH. 18 g 3   No current facility-administered medications on file prior to visit.   No Known Allergies Social History   Social History  . Marital Status: Single    Spouse Name: N/A  . Number of Children: N/A  . Years of Education: N/A   Occupational History  . Not on file.  Social History Main Topics  . Smoking status: Former Research scientist (life sciences)  . Smokeless tobacco: Never Used     Comment: quit over 15yrs ago.  . Alcohol Use: 2.4 - 3.6 oz/week    4-6 Glasses of wine per week  . Drug Use: No  . 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 Not on file   Social History Narrative      Review of Systems  All other systems reviewed and are negative.      Objective:   Physical Exam  Constitutional: He is oriented to person, place, and time.  Cardiovascular: Normal rate, regular rhythm and normal heart sounds.   Pulmonary/Chest: Effort normal and breath sounds normal. No respiratory distress. He has no wheezes. He has no rales. He exhibits no  tenderness.  Abdominal: Soft. Bowel sounds are normal. He exhibits no distension. There is no tenderness. There is no rebound.  Neurological: He is alert and oriented to person, place, and time. He has normal reflexes. He displays normal reflexes. No cranial nerve deficit. He exhibits normal muscle tone. Coordination normal.  Vitals reviewed.         Assessment & Plan:  Essential hypertension - Plan: CBC with Differential/Platelet, COMPLETE METABOLIC PANEL WITH GFR, Lipid panel  HLD (hyperlipidemia) - Plan: CBC with Differential/Platelet, COMPLETE METABOLIC PANEL WITH GFR, Lipid panel  I am very concerned by his blood pressure. Something is wrong. Either his blood pressure cuff is inaccurate. Or today's value was a fluke due to whitecoat syndrome. Therefore I've asked the patient to bring his blood pressure cuff in tomorrow so that we can check it against our own. If significantly elevated, I will start the patient on a beta blocker. I would also like him to return fasting for a CMP and fasting lipid panel. I believe his symptoms at night may also be overactive bladder we could consider trying a medicine like Vesicare but always get his blood pressure under control first

## 2015-03-03 ENCOUNTER — Other Ambulatory Visit: Payer: PPO

## 2015-03-03 ENCOUNTER — Encounter: Payer: Self-pay | Admitting: Family Medicine

## 2015-03-03 DIAGNOSIS — I10 Essential (primary) hypertension: Secondary | ICD-10-CM | POA: Diagnosis not present

## 2015-03-03 DIAGNOSIS — E785 Hyperlipidemia, unspecified: Secondary | ICD-10-CM | POA: Diagnosis not present

## 2015-03-03 LAB — CBC WITH DIFFERENTIAL/PLATELET
Basophils Absolute: 0 10*3/uL (ref 0.0–0.1)
Basophils Relative: 0 % (ref 0–1)
Eosinophils Absolute: 0.9 10*3/uL — ABNORMAL HIGH (ref 0.0–0.7)
Eosinophils Relative: 14 % — ABNORMAL HIGH (ref 0–5)
HCT: 40.5 % (ref 39.0–52.0)
Hemoglobin: 13.5 g/dL (ref 13.0–17.0)
Lymphocytes Relative: 18 % (ref 12–46)
Lymphs Abs: 1.1 10*3/uL (ref 0.7–4.0)
MCH: 31.6 pg (ref 26.0–34.0)
MCHC: 33.3 g/dL (ref 30.0–36.0)
MCV: 94.8 fL (ref 78.0–100.0)
MPV: 9.1 fL (ref 8.6–12.4)
Monocytes Absolute: 0.4 10*3/uL (ref 0.1–1.0)
Monocytes Relative: 7 % (ref 3–12)
Neutro Abs: 3.8 10*3/uL (ref 1.7–7.7)
Neutrophils Relative %: 61 % (ref 43–77)
Platelets: 208 10*3/uL (ref 150–400)
RBC: 4.27 MIL/uL (ref 4.22–5.81)
RDW: 13.2 % (ref 11.5–15.5)
WBC: 6.3 10*3/uL (ref 4.0–10.5)

## 2015-03-03 LAB — COMPLETE METABOLIC PANEL WITH GFR
ALT: 14 U/L (ref 9–46)
AST: 27 U/L (ref 10–35)
Albumin: 4.2 g/dL (ref 3.6–5.1)
Alkaline Phosphatase: 50 U/L (ref 40–115)
BUN: 33 mg/dL — ABNORMAL HIGH (ref 7–25)
CO2: 25 mmol/L (ref 20–31)
Calcium: 8.9 mg/dL (ref 8.6–10.3)
Chloride: 104 mmol/L (ref 98–110)
Creat: 1.64 mg/dL — ABNORMAL HIGH (ref 0.70–1.18)
GFR, Est African American: 45 mL/min — ABNORMAL LOW (ref >=60)
GFR, Est Non African American: 39 mL/min — ABNORMAL LOW (ref >=60)
Glucose, Bld: 81 mg/dL (ref 70–99)
Potassium: 4.9 mmol/L (ref 3.5–5.3)
Sodium: 140 mmol/L (ref 135–146)
Total Bilirubin: 0.5 mg/dL (ref 0.2–1.2)
Total Protein: 6.2 g/dL (ref 6.1–8.1)

## 2015-03-03 LAB — LIPID PANEL
Cholesterol: 160 mg/dL (ref 125–200)
HDL: 48 mg/dL (ref >=40)
LDL Cholesterol: 104 mg/dL (ref <130)
Total CHOL/HDL Ratio: 3.3 Ratio (ref <=5.0)
Triglycerides: 42 mg/dL (ref <150)
VLDL: 8 mg/dL (ref <30)

## 2015-03-08 ENCOUNTER — Ambulatory Visit (INDEPENDENT_AMBULATORY_CARE_PROVIDER_SITE_OTHER): Payer: PPO | Admitting: Family Medicine

## 2015-03-08 ENCOUNTER — Encounter: Payer: Self-pay | Admitting: Family Medicine

## 2015-03-08 VITALS — BP 148/70 | HR 72 | Temp 98.1°F | Resp 18 | Wt 204.0 lb

## 2015-03-08 DIAGNOSIS — E785 Hyperlipidemia, unspecified: Secondary | ICD-10-CM | POA: Diagnosis not present

## 2015-03-08 DIAGNOSIS — I10 Essential (primary) hypertension: Secondary | ICD-10-CM | POA: Diagnosis not present

## 2015-03-08 MED ORDER — METOPROLOL SUCCINATE ER 25 MG PO TB24: 25.0000 mg | ORAL_TABLET | Freq: Every day | ORAL | Status: DC

## 2015-03-08 MED ORDER — ATORVASTATIN CALCIUM 20 MG PO TABS: 20.0000 mg | ORAL_TABLET | Freq: Every day | ORAL | Status: DC

## 2015-03-08 NOTE — Progress Notes (Signed)
Subjective:    Patient ID: [name], male    DOB: [birth year], 82 y.o.   MRN: [id number]  HPI 02/28/15 Patient's blood pressure significantly elevated today at 178/78. I checked the number myself in both arms and repeated again in 10 minutes. However he states that his blood pressure at home has been much better between 120 and 130/80. He is checking his blood pressure everyday with his blood pressure cuff. He is not missing his medication. He denies any chest pain shortness of breath dyspnea on exertion or headache. He has gained a fair amount of weight due to dietary indiscretion but overall is doing very well. He is no longer taking Cialis as his insurance would not pay for it. Therefore he does report 3-4 episodes of nocturia every night. However he denies weak stream, dribbling, hesitancy. He denies any symptoms during the daytime.  At that time, my plan was: I am very concerned by his blood pressure. Something is wrong. Either his blood pressure cuff is inaccurate or today's value was a fluke due to whitecoat syndrome. Therefore I've asked the patient to bring his blood pressure cuff in tomorrow so that we can check it against our own. If significantly elevated, I will start the patient on a beta blocker. I would also like him to return fasting for a CMP and fasting lipid panel. I believe his symptoms at night may also be overactive bladder we could consider trying a medicine like Vesicare but we need to get his blood pressure under control first.  03/08/15  patient's blood pressure is still elevated today at 148/70. He denies any chest pain or shortness of breath or dyspnea on exertion. His most recent lab work as listed below and is significant for a decline in his renal function as well as an LDL cholesterol well below his goal of 70.: Office Visit on 02/28/2015  Component Date Value Ref Range Status  . WBC 03/03/2015 6.3  4.0 - 10.5 K/uL Final  . RBC 03/03/2015 4.27  4.22 - 5.81  MIL/uL Final  . Hemoglobin 03/03/2015 13.5  13.0 - 17.0 g/dL Final  . HCT 03/03/2015 40.5  39.0 - 52.0 % Final  . MCV 03/03/2015 94.8  78.0 - 100.0 fL Final  . MCH 03/03/2015 31.6  26.0 - 34.0 pg Final  . MCHC 03/03/2015 33.3  30.0 - 36.0 g/dL Final  . RDW 03/03/2015 13.2  11.5 - 15.5 % Final  . Platelets 03/03/2015 208  150 - 400 K/uL Final  . MPV 03/03/2015 9.1  8.6 - 12.4 fL Final  . Neutrophils Relative % 03/03/2015 61  43 - 77 % Final  . Neutro Abs 03/03/2015 3.8  1.7 - 7.7 K/uL Final  . Lymphocytes Relative 03/03/2015 18  12 - 46 % Final  . Lymphs Abs 03/03/2015 1.1  0.7 - 4.0 K/uL Final  . Monocytes Relative 03/03/2015 7  3 - 12 % Final  . Monocytes Absolute 03/03/2015 0.4  0.1 - 1.0 K/uL Final  . Eosinophils Relative 03/03/2015 14* 0 - 5 % Final  . Eosinophils Absolute 03/03/2015 0.9* 0.0 - 0.7 K/uL Final  . Basophils Relative 03/03/2015 0  0 - 1 % Final  . Basophils Absolute 03/03/2015 0.0  0.0 - 0.1 K/uL Final  . Smear Review 03/03/2015 Criteria for review not met   Final  . Sodium 03/03/2015 140  135 - 146 mmol/L Final  . Potassium 03/03/2015 4.9  3.5 - 5.3 mmol/L Final  . Chloride 03/03/2015  104  98 - 110 mmol/L Final  . CO2 03/03/2015 25  20 - 31 mmol/L Final  . Glucose, Bld 03/03/2015 81  70 - 99 mg/dL Final  . BUN 03/03/2015 33* 7 - 25 mg/dL Final  . Creat 03/03/2015 1.64* 0.70 - 1.18 mg/dL Final  . Total Bilirubin 03/03/2015 0.5  0.2 - 1.2 mg/dL Final  . Alkaline Phosphatase 03/03/2015 50  40 - 115 U/L Final  . AST 03/03/2015 27  10 - 35 U/L Final  . ALT 03/03/2015 14  9 - 46 U/L Final  . Total Protein 03/03/2015 6.2  6.1 - 8.1 g/dL Final  . Albumin 03/03/2015 4.2  3.6 - 5.1 g/dL Final  . Calcium 03/03/2015 8.9  8.6 - 10.3 mg/dL Final  . GFR, Est African American 03/03/2015 45* >=60 mL/min Final  . GFR, Est Non African American 03/03/2015 39* >=60 mL/min Final   Comment:   The estimated GFR is a calculation valid for adults (>=32 years old) that uses the CKD-EPI  algorithm to adjust for age and sex. It is   not to be used for children, pregnant women, hospitalized patients,    patients on dialysis, or with rapidly changing kidney function. According to the NKDEP, eGFR >89 is normal, 60-89 shows mild impairment, 30-59 shows moderate impairment, 15-29 shows severe impairment and <15 is ESRD.     Marland Kitchen Cholesterol 03/03/2015 160  125 - 200 mg/dL Final  . Triglycerides 03/03/2015 42  <150 mg/dL Final  . HDL 03/03/2015 48  >=40 mg/dL Final  . Total CHOL/HDL Ratio 03/03/2015 3.3  <=5.0 Ratio Final  . VLDL 03/03/2015 8  <30 mg/dL Final  . LDL Cholesterol 03/03/2015 104  <130 mg/dL Final   Comment:   Total Cholesterol/HDL Ratio:CHD Risk                        Coronary Heart Disease Risk Table                                        Men       Women          1/2 Average Risk              3.4        3.3              Average Risk              5.0        4.4           2X Average Risk              9.6        7.1           3X Average Risk             23.4       11.0 Use the calculated Patient Ratio above and the CHD Risk table  to determine the patient's CHD Risk.     Past Medical History  Diagnosis Date  . CAD (coronary artery disease)     s/p cypher DES to pLAD 6/08; normal LVF;  ETT-Myoview 2009: no ischemia   . Hyperlipidemia   . HTN (hypertension)   . Allergy   . Myocardial infarction ([place])   . BPH (benign prostatic hypertrophy)   . Eosinophilia  Past Surgical History  Procedure Laterality Date  . Exploratory laparotomy      age 27  . Appendectomy    . Cardiac catheterization  07/30/2006    CORONARY ANGIOPLASTY WITH STENT PLACEMENT   Current Outpatient Prescriptions on File Prior to Visit  Medication Sig Dispense Refill  . ADVAIR DISKUS 250-50 MCG/DOSE AEPB INHALE 1 PUFF INTO THE LUNGS 2 (TWO) TIMES DAILY. 60 each 5  . amLODipine-benazepril (LOTREL) 5-20 MG capsule Take 1 capsule by mouth 2 (two) times daily. 180 capsule 3  . aspirin EC 81  MG tablet Take 1 tablet (81 mg total) by mouth daily.    . Calcium Carbonate-Vitamin D (CALCARB 600/D) 600-400 MG-UNIT per tablet Take by mouth daily.      Marland Kitchen CLINPRO 5000 1.1 % PSTE BRUSH WITH A PEA SIZED AMOUNT FOR 2 MINUTES, RINSE AND SPIT *DO NOT EAT/DRINK FOR 30 MIN* daily  6  . Coenzyme Q-10 100 MG capsule Take 100 mg by mouth daily.    Marland Kitchen DHEA 50 MG CAPS Take 1 capsule by mouth daily with breakfast. T-Boost    . fluticasone (FLONASE) 50 MCG/ACT nasal spray Place 2 sprays into both nostrils daily. 16 g 6  . ibuprofen (ADVIL,MOTRIN) 600 MG tablet Take 1 tablet (600 mg total) by mouth every 6 (six) hours as needed for pain. 30 tablet 1  . Multiple Vitamin (MULTIVITAMIN) capsule Take 1 capsule by mouth daily. Resetigen-D  Cellular rejuvenation formula    . NON FORMULARY Omega Q 1 tab twice daily    . OVER THE COUNTER MEDICATION Take 1 tablet by mouth daily. Juvenon  Cellular health supplement    . simvastatin (ZOCOR) 10 MG tablet Take 10 mg by mouth daily.    . tamsulosin (FLOMAX) 0.4 MG CAPS capsule TAKE 1 CAPSULE (0.4 MG TOTAL) BY MOUTH DAILY. 90 capsule 3  . VENTOLIN HFA 108 (90 BASE) MCG/ACT inhaler INHALE 2 PUFFS INTO THE LUNGS EVERY 6 (SIX) HOURS AS NEEDED FOR WHEEZING OR SHORTNESS OF BREATH. 18 g 3   No current facility-administered medications on file prior to visit.   No Known Allergies Social History   Social History  . Marital Status: Single    Spouse Name: N/A  . Number of Children: N/A  . Years of Education: N/A   Occupational History  . Not on file.   Social History Main Topics  . Smoking status: Former Research scientist (life sciences)  . Smokeless tobacco: Never Used     Comment: quit over 63yr ago.  . Alcohol Use: 2.4 - 3.6 oz/week    4-6 Glasses of wine per week  . Drug Use: No  . 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 Not on file   Social History Narrative      Review of Systems  All other  systems reviewed and are negative.      Objective:   Physical Exam  Constitutional: He is oriented to person, place, and time.  Cardiovascular: Normal rate, regular rhythm and normal heart sounds.   Pulmonary/Chest: Effort normal and breath sounds normal. No respiratory distress. He has no wheezes. He has no rales. He exhibits no tenderness.  Abdominal: Soft. Bowel sounds are normal. He exhibits no distension. There is no tenderness. There is no rebound.  Neurological: He is alert and oriented to person, place, and time. He has normal reflexes. No cranial nerve deficit. He exhibits normal muscle  tone. Coordination normal.  Vitals reviewed.         Assessment & Plan:  HLD (hyperlipidemia) - Plan: atorvastatin (LIPITOR) 20 MG tablet  Benign essential HTN - Plan: metoprolol succinate (TOPROL-XL) 25 MG 24 hr tablet   I recommended that we discontinue simvastatin. He cannot tolerate a higher dose because of amlodipine. Furthermore his LDL is not at goal. Therefore I will discontinue the simvastatin and replace with Lipitor 20 mg by mouth daily. I am concerned by the decline in his renal function. I have recommended that he continue the high-dose benazepril that he try to drink more water and avoid NSAIDs. I will add Toprol-XL 25 mg by mouth daily for hypertension and have him work on his diet. I picked a beta blocker due to his history of ASCVD. Recheck renal function and blood pressure in one month. If renal function worsens , we may want to obtain an ultrasound of the kidneys as well as an ultrasound of the renal arteries rule out obstructions, renal artery stenosis, etc..

## 2015-03-18 ENCOUNTER — Telehealth: Payer: Self-pay | Admitting: Family Medicine

## 2015-03-18 NOTE — Telephone Encounter (Signed)
Yes, double the med and recheck next week.

## 2015-03-18 NOTE — Telephone Encounter (Signed)
Patient aware and will double it and call back next week with BP

## 2015-03-18 NOTE — Telephone Encounter (Signed)
Spoke to pt and he states that he has been on Metoprolol for 10 days now and his BP's are as high as 180's and no lower then 145 with the bottom number being in the 70-80 range. He would like to know if he needs to double the medication or what do you recommend he do about his BP?

## 2015-03-18 NOTE — Telephone Encounter (Signed)
Patient says that his beta blocker is not working and his blood pressure is high please call him at [phone]

## 2015-03-22 ENCOUNTER — Telehealth: Payer: Self-pay | Admitting: Family Medicine

## 2015-03-22 MED ORDER — DOXAZOSIN MESYLATE 2 MG PO TABS: 2.0000 mg | ORAL_TABLET | Freq: Every day | ORAL | Status: DC

## 2015-03-22 NOTE — Telephone Encounter (Signed)
Pt aware and med sent to pharm 

## 2015-03-22 NOTE — Telephone Encounter (Signed)
Does not seem to be responding to toprol.  I would add doxazosin 2 mg poqday and dc flomax.

## 2015-03-22 NOTE — Telephone Encounter (Signed)
Blood pressure readings average  This morning 176/86  Average over 5 days 172/80 [phone] Physical is next week wanted to remind you of that

## 2015-03-28 ENCOUNTER — Encounter: Payer: Self-pay | Admitting: Family Medicine

## 2015-03-28 ENCOUNTER — Ambulatory Visit (INDEPENDENT_AMBULATORY_CARE_PROVIDER_SITE_OTHER): Payer: PPO | Admitting: Family Medicine

## 2015-03-28 VITALS — BP 140/78 | HR 58 | Temp 98.2°F | Resp 16 | Ht 67.0 in | Wt 204.0 lb

## 2015-03-28 DIAGNOSIS — I2510 Atherosclerotic heart disease of native coronary artery without angina pectoris: Secondary | ICD-10-CM

## 2015-03-28 DIAGNOSIS — Z125 Encounter for screening for malignant neoplasm of prostate: Secondary | ICD-10-CM | POA: Diagnosis not present

## 2015-03-28 DIAGNOSIS — E785 Hyperlipidemia, unspecified: Secondary | ICD-10-CM

## 2015-03-28 DIAGNOSIS — I10 Essential (primary) hypertension: Secondary | ICD-10-CM | POA: Diagnosis not present

## 2015-03-28 DIAGNOSIS — Z0000 Encounter for general adult medical examination without abnormal findings: Secondary | ICD-10-CM | POA: Diagnosis not present

## 2015-03-28 NOTE — Progress Notes (Signed)
Subjective:    Patient ID: [name], male    DOB: Mar 28, 1933, 82 y.o.   MRN: [id number]  HPI  Patient is here today for complete physical exam. At his last office visit, I started the patient on metoprolol. I subsequently doubled the medications dosage and eventually discontinued Flomax and supplemented with doxazosin.  Unfortunately, his blood pressure remains difficult to control. I personally checked his blood pressure today and found to be 178/80. He is getting similar numbers at home. We have now doubled his medications and added 2 more medications that have not seen any substantial drop in his blood pressure. I feel it is time we evaluate for secondary causes of hypertension.  Given his recent rise in his creatinine which I attributed to his ACE inhibitor, renal artery stenosis is a possibility. Immunization History  Administered Date(s) Administered  . Influenza Split 10/06/2012  . Influenza,inj,Quad PF,36+ Mos 11/12/2013, 11/11/2014  . Pneumococcal Conjugate-13 03/17/2013  . Pneumococcal Polysaccharide-23 02/05/2001  . Td 03/01/2009  . Zoster 02/25/2008    Past Medical History  Diagnosis Date  . CAD (coronary artery disease)     s/p cypher DES to pLAD 6/08; normal LVF;  ETT-Myoview 2009: no ischemia   . Hyperlipidemia   . HTN (hypertension)   . Allergy   . Myocardial infarction ([place])   . BPH (benign prostatic hypertrophy)   . Eosinophilia    Past Surgical History  Procedure Laterality Date  . Exploratory laparotomy      age 71  . Appendectomy    . Cardiac catheterization  07/30/2006    CORONARY ANGIOPLASTY WITH STENT PLACEMENT   Current Outpatient Prescriptions on File Prior to Visit  Medication Sig Dispense Refill  . ADVAIR DISKUS 250-50 MCG/DOSE AEPB INHALE 1 PUFF INTO THE LUNGS 2 (TWO) TIMES DAILY. 60 each 5  . amLODipine-benazepril (LOTREL) 5-20 MG capsule Take 1 capsule by mouth 2 (two) times daily. 180 capsule 3  . aspirin EC 81 MG tablet Take 1 tablet  (81 mg total) by mouth daily.    Marland Kitchen atorvastatin (LIPITOR) 20 MG tablet Take 1 tablet (20 mg total) by mouth daily. 90 tablet 3  . Calcium Carbonate-Vitamin D (CALCARB 600/D) 600-400 MG-UNIT per tablet Take by mouth daily.      Marland Kitchen CLINPRO 5000 1.1 % PSTE BRUSH WITH A PEA SIZED AMOUNT FOR 2 MINUTES, RINSE AND SPIT *DO NOT EAT/DRINK FOR 30 MIN* daily  6  . Coenzyme Q-10 100 MG capsule Take 100 mg by mouth daily.    Marland Kitchen DHEA 50 MG CAPS Take 1 capsule by mouth daily with breakfast. T-Boost    . doxazosin (CARDURA) 2 MG tablet Take 1 tablet (2 mg total) by mouth daily. 30 tablet 5  . fluticasone (FLONASE) 50 MCG/ACT nasal spray Place 2 sprays into both nostrils daily. 16 g 6  . ibuprofen (ADVIL,MOTRIN) 600 MG tablet Take 1 tablet (600 mg total) by mouth every 6 (six) hours as needed for pain. 30 tablet 1  . metoprolol succinate (TOPROL-XL) 25 MG 24 hr tablet Take 1 tablet (25 mg total) by mouth daily. 90 tablet 3  . Multiple Vitamin (MULTIVITAMIN) capsule Take 1 capsule by mouth daily. Resetigen-D  Cellular rejuvenation formula    . NON FORMULARY Omega Q 1 tab twice daily    . OVER THE COUNTER MEDICATION Take 1 tablet by mouth daily. Juvenon  Cellular health supplement    . tamsulosin (FLOMAX) 0.4 MG CAPS capsule TAKE 1 CAPSULE (0.4 MG TOTAL) BY  MOUTH DAILY. 90 capsule 3  . VENTOLIN HFA 108 (90 BASE) MCG/ACT inhaler INHALE 2 PUFFS INTO THE LUNGS EVERY 6 (SIX) HOURS AS NEEDED FOR WHEEZING OR SHORTNESS OF BREATH. 18 g 3   No current facility-administered medications on file prior to visit.   No Known Allergies Social History   Social History  . Marital Status: Single    Spouse Name: N/A  . Number of Children: N/A  . Years of Education: N/A   Occupational History  . Not on file.   Social History Main Topics  . Smoking status: Former Research scientist (life sciences)  . Smokeless tobacco: Never Used     Comment: quit over 59yr ago.  . Alcohol Use: 2.4 - 3.6 oz/week    4-6 Glasses of wine per week  . Drug Use: No  .  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 Not on file   Social History Narrative   Family History  Problem Relation Age of Onset  . Coronary artery disease Father 770 . Coronary artery disease Brother   . Aortic aneurysm Brother   . Colon cancer Brother   . Stomach cancer Mother   . Esophageal cancer Neg Hx   . Rectal cancer Neg Hx      Review of Systems  All other systems reviewed and are negative.      Objective:   Physical Exam  Constitutional: He is oriented to person, place, and time. He appears well-developed and well-nourished. No distress.  HENT:  Head: Normocephalic and atraumatic.  Right Ear: External ear normal.  Left Ear: External ear normal.  Nose: Nose normal.  Mouth/Throat: Oropharynx is clear and moist. No oropharyngeal exudate.  Eyes: Conjunctivae and EOM are normal. Pupils are equal, round, and reactive to light. Right eye exhibits no discharge. Left eye exhibits no discharge. No scleral icterus.  Neck: Normal range of motion. Neck supple. No JVD present. No tracheal deviation present. No thyromegaly present.  Cardiovascular: Normal rate, regular rhythm, normal heart sounds and intact distal pulses.  Exam reveals no gallop and no friction rub.   No murmur heard. Pulmonary/Chest: Effort normal and breath sounds normal. No stridor. No respiratory distress. He has no wheezes. He has no rales. He exhibits no tenderness.  Abdominal: Soft. Bowel sounds are normal. He exhibits no distension and no mass. There is no tenderness. There is no rebound and no guarding.  Genitourinary: Penis normal. No penile tenderness.  Musculoskeletal: Normal range of motion. He exhibits no edema or tenderness.  Lymphadenopathy:    He has no cervical adenopathy.  Neurological: He is alert and oriented to person, place, and time. He has normal reflexes. No cranial nerve deficit. He exhibits normal  muscle tone. Coordination normal.  Skin: Skin is warm. No rash noted. He is not diaphoretic. No erythema. No pallor.  Psychiatric: He has a normal mood and affect. His behavior is normal. Judgment and thought content normal.  Vitals reviewed.         Assessment & Plan:  Routine general medical examination at a health care facility - Plan: PSA  HLD (hyperlipidemia)  Benign essential HTN - Plan: [place]  ASCVD (arteriosclerotic cardiovascular disease) - Plan: [place]  The patient will continue to work on diet exercise and weight loss. We will make no further changes in his medication for the next 2 weeks and allow his body  a chance to adjust. I will schedule the patient for an ultrasound to evaluate for renal artery stenosis. I will also add a PSA. His cholesterol has risen but the patient would like to address this with diet and exercise. His most recent lab work as listed below: Office Visit on 02/28/2015  Component Date Value Ref Range Status  . WBC 03/03/2015 6.3  4.0 - 10.5 K/uL Final  . RBC 03/03/2015 4.27  4.22 - 5.81 MIL/uL Final  . Hemoglobin 03/03/2015 13.5  13.0 - 17.0 g/dL Final  . HCT 03/03/2015 40.5  39.0 - 52.0 % Final  . MCV 03/03/2015 94.8  78.0 - 100.0 fL Final  . MCH 03/03/2015 31.6  26.0 - 34.0 pg Final  . MCHC 03/03/2015 33.3  30.0 - 36.0 g/dL Final  . RDW 03/03/2015 13.2  11.5 - 15.5 % Final  . Platelets 03/03/2015 208  150 - 400 K/uL Final  . MPV 03/03/2015 9.1  8.6 - 12.4 fL Final  . Neutrophils Relative % 03/03/2015 61  43 - 77 % Final  . Neutro Abs 03/03/2015 3.8  1.7 - 7.7 K/uL Final  . Lymphocytes Relative 03/03/2015 18  12 - 46 % Final  . Lymphs Abs 03/03/2015 1.1  0.7 - 4.0 K/uL Final  . Monocytes Relative 03/03/2015 7  3 - 12 % Final  . Monocytes Absolute 03/03/2015 0.4  0.1 - 1.0 K/uL Final  . Eosinophils Relative 03/03/2015 14* 0 - 5 % Final  . Eosinophils Absolute 03/03/2015 0.9* 0.0 - 0.7 K/uL Final  .  Basophils Relative 03/03/2015 0  0 - 1 % Final  . Basophils Absolute 03/03/2015 0.0  0.0 - 0.1 K/uL Final  . Smear Review 03/03/2015 Criteria for review not met   Final  . Sodium 03/03/2015 140  135 - 146 mmol/L Final  . Potassium 03/03/2015 4.9  3.5 - 5.3 mmol/L Final  . Chloride 03/03/2015 104  98 - 110 mmol/L Final  . CO2 03/03/2015 25  20 - 31 mmol/L Final  . Glucose, Bld 03/03/2015 81  70 - 99 mg/dL Final  . BUN 03/03/2015 33* 7 - 25 mg/dL Final  . Creat 03/03/2015 1.64* 0.70 - 1.18 mg/dL Final  . Total Bilirubin 03/03/2015 0.5  0.2 - 1.2 mg/dL Final  . Alkaline Phosphatase 03/03/2015 50  40 - 115 U/L Final  . AST 03/03/2015 27  10 - 35 U/L Final  . ALT 03/03/2015 14  9 - 46 U/L Final  . Total Protein 03/03/2015 6.2  6.1 - 8.1 g/dL Final  . Albumin 03/03/2015 4.2  3.6 - 5.1 g/dL Final  . Calcium 03/03/2015 8.9  8.6 - 10.3 mg/dL Final  . GFR, Est African American 03/03/2015 45* >=60 mL/min Final  . GFR, Est Non African American 03/03/2015 39* >=60 mL/min Final   Comment:   The estimated GFR is a calculation valid for adults (>=76 years old) that uses the CKD-EPI algorithm to adjust for age and sex. It is   not to be used for children, pregnant women, hospitalized patients,    patients on dialysis, or with rapidly changing kidney function. According to the NKDEP, eGFR >89 is normal, 60-89 shows mild impairment, 30-59 shows moderate impairment, 15-29 shows severe impairment and <15 is ESRD.     Marland Kitchen Cholesterol 03/03/2015 160  125 - 200 mg/dL Final  . Triglycerides 03/03/2015 42  <150 mg/dL Final  . HDL 03/03/2015 48  >=40 mg/dL Final  . Total CHOL/HDL Ratio 03/03/2015 3.3  <=5.0  Ratio Final  . VLDL 03/03/2015 8  <30 mg/dL Final  . LDL Cholesterol 03/03/2015 104  <130 mg/dL Final   Comment:   Total Cholesterol/HDL Ratio:CHD Risk                        Coronary Heart Disease Risk Table                                        Men       Women          1/2 Average Risk               3.4        3.3              Average Risk              5.0        4.4           2X Average Risk              9.6        7.1           3X Average Risk             23.4       11.0 Use the calculated Patient Ratio above and the CHD Risk table  to determine the patient's CHD Risk.

## 2015-03-29 ENCOUNTER — Encounter: Payer: Self-pay | Admitting: Family Medicine

## 2015-03-29 LAB — PSA: PSA: 0.98 ng/mL (ref <=4.00)

## 2015-03-30 ENCOUNTER — Encounter: Payer: Self-pay | Admitting: Family Medicine

## 2015-04-05 ENCOUNTER — Encounter: Payer: Self-pay | Admitting: *Deleted

## 2015-04-05 ENCOUNTER — Other Ambulatory Visit: Payer: Self-pay | Admitting: Family Medicine

## 2015-04-05 DIAGNOSIS — I10 Essential (primary) hypertension: Secondary | ICD-10-CM

## 2015-04-06 ENCOUNTER — Encounter: Payer: Self-pay | Admitting: Family Medicine

## 2015-04-09 ENCOUNTER — Other Ambulatory Visit: Payer: Self-pay | Admitting: Family Medicine

## 2015-04-12 ENCOUNTER — Encounter: Payer: Self-pay | Admitting: Family Medicine

## 2015-04-12 ENCOUNTER — Ambulatory Visit (INDEPENDENT_AMBULATORY_CARE_PROVIDER_SITE_OTHER): Payer: PPO | Admitting: Family Medicine

## 2015-04-12 VITALS — BP 190/90 | HR 60 | Temp 97.9°F | Resp 16 | Ht 67.0 in | Wt 200.0 lb

## 2015-04-12 DIAGNOSIS — I10 Essential (primary) hypertension: Secondary | ICD-10-CM

## 2015-04-12 LAB — BASIC METABOLIC PANEL WITH GFR
BUN: 22 mg/dL (ref 7–25)
CO2: 26 mmol/L (ref 20–31)
Calcium: 9.3 mg/dL (ref 8.6–10.3)
Chloride: 104 mmol/L (ref 98–110)
Creat: 1.32 mg/dL — ABNORMAL HIGH (ref 0.70–1.18)
GFR, Est African American: 59 mL/min — ABNORMAL LOW (ref >=60)
GFR, Est Non African American: 51 mL/min — ABNORMAL LOW (ref >=60)
Glucose, Bld: 101 mg/dL — ABNORMAL HIGH (ref 70–99)
Potassium: 4.4 mmol/L (ref 3.5–5.3)
Sodium: 140 mmol/L (ref 135–146)

## 2015-04-12 NOTE — Progress Notes (Signed)
Subjective:    Patient ID: [name], male    DOB: Apr 05, 1933, 82 y.o.   MRN: [id number]  HPI 03/28/15 Patient is here today for complete physical exam. At his last office visit, I started the patient on metoprolol. I subsequently doubled the medications dosage and eventually discontinued Flomax and supplemented with doxazosin.  Unfortunately, his blood pressure remains difficult to control. I personally checked his blood pressure today and found to be 178/80. He is getting similar numbers at home. We have now doubled his medications and added 2 more medications that have not seen any substantial drop in his blood pressure. I feel it is time we evaluate for secondary causes of hypertension.  Given his recent rise in his creatinine which I attributed to his ACE inhibitor, renal artery stenosis is a possibility.  At that time, my plan was: The patient will continue to work on diet exercise and weight loss. We will make no further changes in his medication for the next 2 weeks and allow his body a chance to adjust. I will schedule the patient for an ultrasound to evaluate for renal artery stenosis. I will also add a PSA. His cholesterol has risen but the patient would like to address this with diet and exercise. His most recent lab work as listed below:   04/12/15 He is here for follow up. His BP is worse.  However, I believed the patient was taking max dose lotrel, metoprolol, and doxazosin.  However, he quit Lotrel due to a miscommunication and is only taking metoprolol and doxazosin. He is scheduled for an ultrasound to evaluate for renal artery stenosis on Monday. He is here today to recheck his kidney function given his recent rising creatinine. However he is on half of the medication that I believed him to be taking. He does complain of itching ever since starting the metoprolol along with nausea and fatigue  Immunization History  Administered Date(s) Administered  . Influenza Split  10/06/2012  . Influenza,inj,Quad PF,36+ Mos 11/12/2013, 11/11/2014  . Pneumococcal Conjugate-13 03/17/2013  . Pneumococcal Polysaccharide-23 02/05/2001  . Td 03/01/2009  . Zoster 02/25/2008    Past Medical History  Diagnosis Date  . CAD (coronary artery disease)     s/p cypher DES to pLAD 6/08; normal LVF;  ETT-Myoview 2009: no ischemia   . Hyperlipidemia   . HTN (hypertension)   . Allergy   . Myocardial infarction ([place])   . BPH (benign prostatic hypertrophy)   . Eosinophilia    Past Surgical History  Procedure Laterality Date  . Exploratory laparotomy      age 80  . Appendectomy    . Cardiac catheterization  07/30/2006    CORONARY ANGIOPLASTY WITH STENT PLACEMENT   Current Outpatient Prescriptions on File Prior to Visit  Medication Sig Dispense Refill  . ADVAIR DISKUS 250-50 MCG/DOSE AEPB INHALE 1 PUFF INTO THE LUNGS 2 (TWO) TIMES DAILY. 60 each 5  . amLODipine-benazepril (LOTREL) 5-20 MG capsule Take 1 capsule by mouth 2 (two) times daily. 180 capsule 3  . aspirin EC 81 MG tablet Take 1 tablet (81 mg total) by mouth daily.    Marland Kitchen atorvastatin (LIPITOR) 20 MG tablet Take 1 tablet (20 mg total) by mouth daily. 90 tablet 3  . Calcium Carbonate-Vitamin D (CALCARB 600/D) 600-400 MG-UNIT per tablet Take by mouth daily.      Marland Kitchen CLINPRO 5000 1.1 % PSTE BRUSH WITH A PEA SIZED AMOUNT FOR 2 MINUTES, RINSE AND SPIT *DO NOT EAT/DRINK FOR  30 MIN* daily  6  . Coenzyme Q-10 100 MG capsule Take 100 mg by mouth daily.    Marland Kitchen DHEA 50 MG CAPS Take 1 capsule by mouth daily with breakfast. T-Boost    . doxazosin (CARDURA) 2 MG tablet Take 1 tablet (2 mg total) by mouth daily. 30 tablet 5  . fluticasone (FLONASE) 50 MCG/ACT nasal spray Place 2 sprays into both nostrils daily. 16 g 6  . ibuprofen (ADVIL,MOTRIN) 600 MG tablet Take 1 tablet (600 mg total) by mouth every 6 (six) hours as needed for pain. 30 tablet 1  . metoprolol succinate (TOPROL-XL) 25 MG 24 hr tablet Take 1 tablet (25 mg total) by mouth  daily. 90 tablet 3  . Multiple Vitamin (MULTIVITAMIN) capsule Take 1 capsule by mouth daily. Resetigen-D  Cellular rejuvenation formula    . NON FORMULARY Omega Q 1 tab twice daily    . OVER THE COUNTER MEDICATION Take 1 tablet by mouth daily. Juvenon  Cellular health supplement    . tamsulosin (FLOMAX) 0.4 MG CAPS capsule TAKE 1 CAPSULE (0.4 MG TOTAL) BY MOUTH DAILY. 90 capsule 4  . VENTOLIN HFA 108 (90 BASE) MCG/ACT inhaler INHALE 2 PUFFS INTO THE LUNGS EVERY 6 (SIX) HOURS AS NEEDED FOR WHEEZING OR SHORTNESS OF BREATH. 18 g 3   No current facility-administered medications on file prior to visit.   No Known Allergies Social History   Social History  . Marital Status: Single    Spouse Name: N/A  . Number of Children: N/A  . Years of Education: N/A   Occupational History  . Not on file.   Social History Main Topics  . Smoking status: Former Research scientist (life sciences)  . Smokeless tobacco: Never Used     Comment: quit over 37yrs ago.  . Alcohol Use: 2.4 - 3.6 oz/week    4-6 Glasses of wine per week  . Drug Use: No  . 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 Not on file   Social History Narrative   Family History  Problem Relation Age of Onset  . Coronary artery disease Father 1  . Coronary artery disease Brother   . Aortic aneurysm Brother   . Colon cancer Brother   . Stomach cancer Mother   . Esophageal cancer Neg Hx   . Rectal cancer Neg Hx      Review of Systems  All other systems reviewed and are negative.      Objective:   Physical Exam  Constitutional: He is oriented to person, place, and time. He appears well-developed and well-nourished. No distress.  HENT:  Head: Normocephalic and atraumatic.  Right Ear: External ear normal.  Left Ear: External ear normal.  Nose: Nose normal.  Mouth/Throat: Oropharynx is clear and moist. No oropharyngeal exudate.  Eyes: Conjunctivae and EOM are  normal. Pupils are equal, round, and reactive to light. Right eye exhibits no discharge. Left eye exhibits no discharge. No scleral icterus.  Neck: Normal range of motion. Neck supple. No JVD present. No tracheal deviation present. No thyromegaly present.  Cardiovascular: Normal rate, regular rhythm, normal heart sounds and intact distal pulses.  Exam reveals no gallop and no friction rub.   No murmur heard. Pulmonary/Chest: Effort normal and breath sounds normal. No stridor. No respiratory distress. He has no wheezes. He has no rales. He exhibits no tenderness.  Abdominal: Soft. Bowel sounds are normal. He exhibits no  distension and no mass. There is no tenderness. There is no rebound and no guarding.  Genitourinary: Penis normal. No penile tenderness.  Musculoskeletal: Normal range of motion. He exhibits no edema or tenderness.  Lymphadenopathy:    He has no cervical adenopathy.  Neurological: He is alert and oriented to person, place, and time. He has normal reflexes. No cranial nerve deficit. He exhibits normal muscle tone. Coordination normal.  Skin: Skin is warm. No rash noted. He is not diaphoretic. No erythema. No pallor.  Psychiatric: He has a normal mood and affect. His behavior is normal. Judgment and thought content normal.  Vitals reviewed.         Assessment & Plan:  Benign essential HTN - Plan: BASIC METABOLIC PANEL WITH GFR  Neck creatinine to evaluate for uremia as a potential cause of his itching. Discontinue metoprolol as the patient seems to be not tolerating it well. However resume Lotrel 10/40 one by mouth daily and recheck blood pressure in 2 weeks continue doxazosin. Await the results of the renal artery ultrasound

## 2015-04-13 ENCOUNTER — Encounter: Payer: Self-pay | Admitting: Family Medicine

## 2015-04-18 ENCOUNTER — Other Ambulatory Visit: Payer: Medicare Other

## 2015-05-30 ENCOUNTER — Ambulatory Visit (INDEPENDENT_AMBULATORY_CARE_PROVIDER_SITE_OTHER): Payer: PPO | Admitting: Physician Assistant

## 2015-05-30 ENCOUNTER — Ambulatory Visit (INDEPENDENT_AMBULATORY_CARE_PROVIDER_SITE_OTHER): Payer: PPO

## 2015-05-30 VITALS — BP 114/82 | HR 69 | Temp 97.8°F | Resp 18 | Ht 67.0 in | Wt 200.0 lb

## 2015-05-30 DIAGNOSIS — M25551 Pain in right hip: Secondary | ICD-10-CM

## 2015-05-30 DIAGNOSIS — M1611 Unilateral primary osteoarthritis, right hip: Secondary | ICD-10-CM | POA: Diagnosis not present

## 2015-05-30 IMAGING — CR DG HIP (WITH OR WITHOUT PELVIS) 2-3V*R*
3 series · 3 of 3 positions shown · non-contrast
Comparison: None.

CLINICAL DATA: 81-year-old with right hip pain and popping for 3
days.

EXAM:
DG HIP (WITH OR WITHOUT PELVIS) 2-3V RIGHT

[AP (1 of 2)]
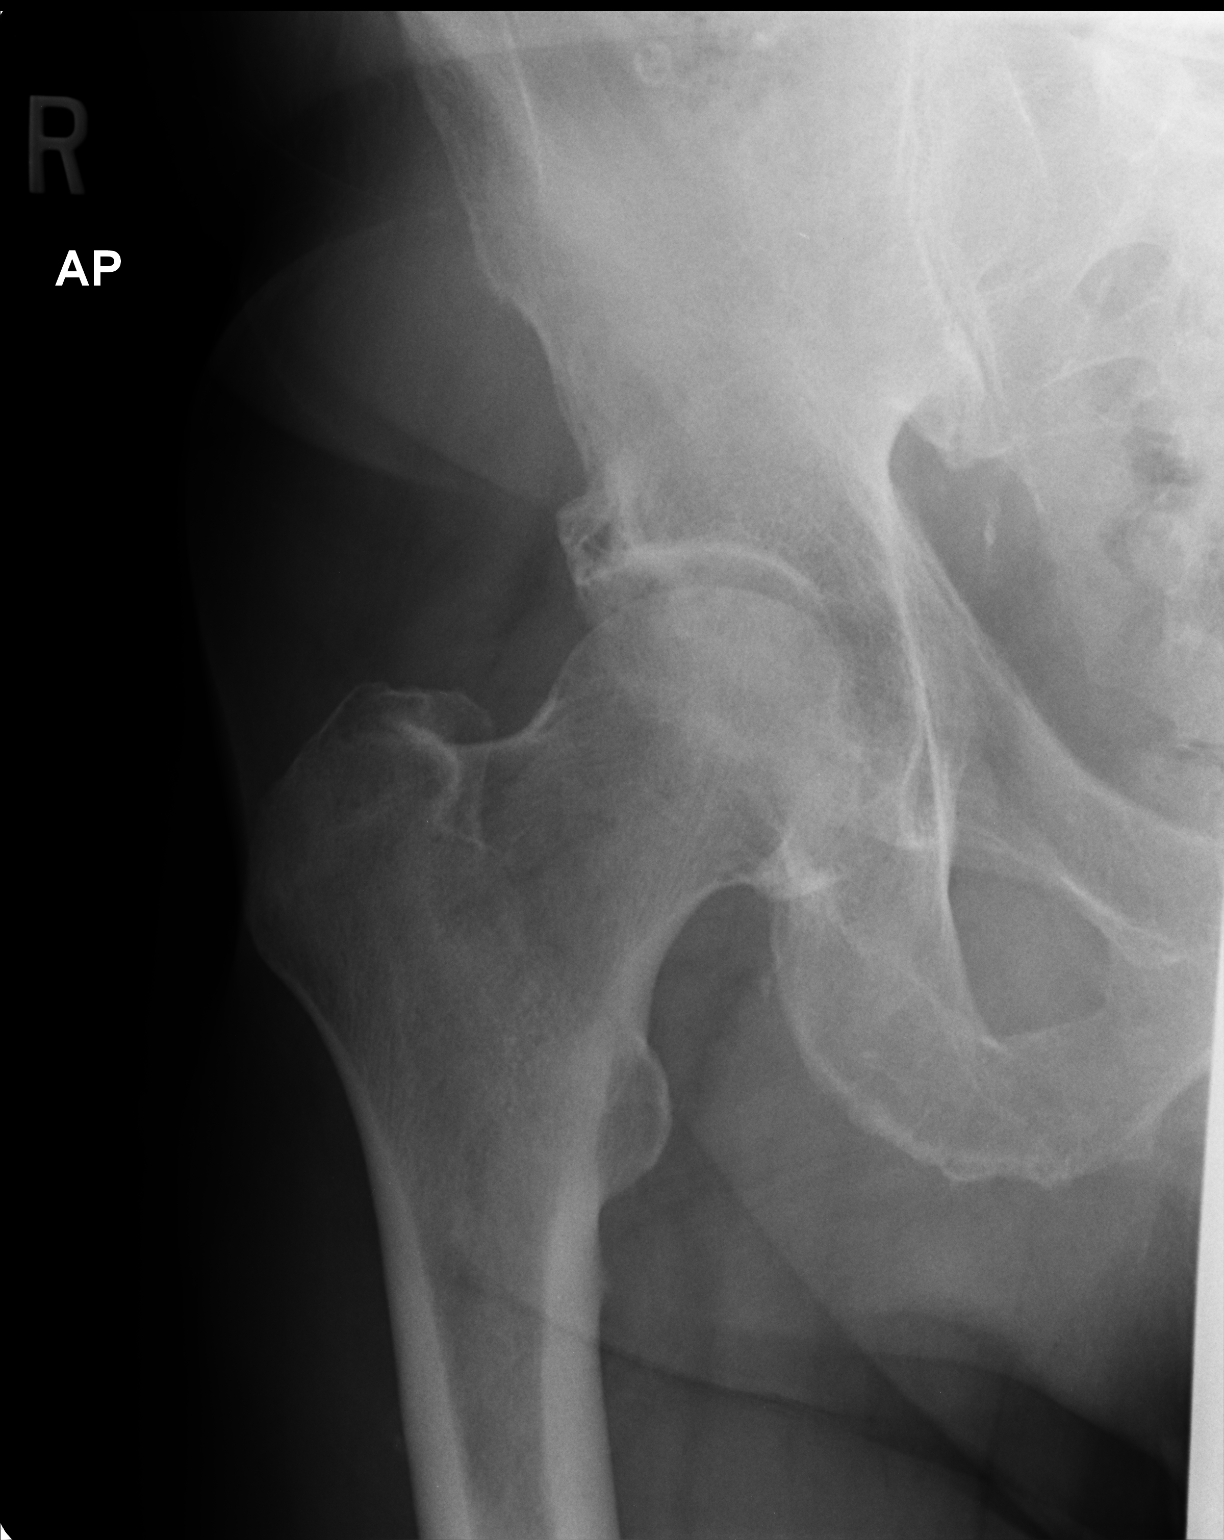

[lateral]
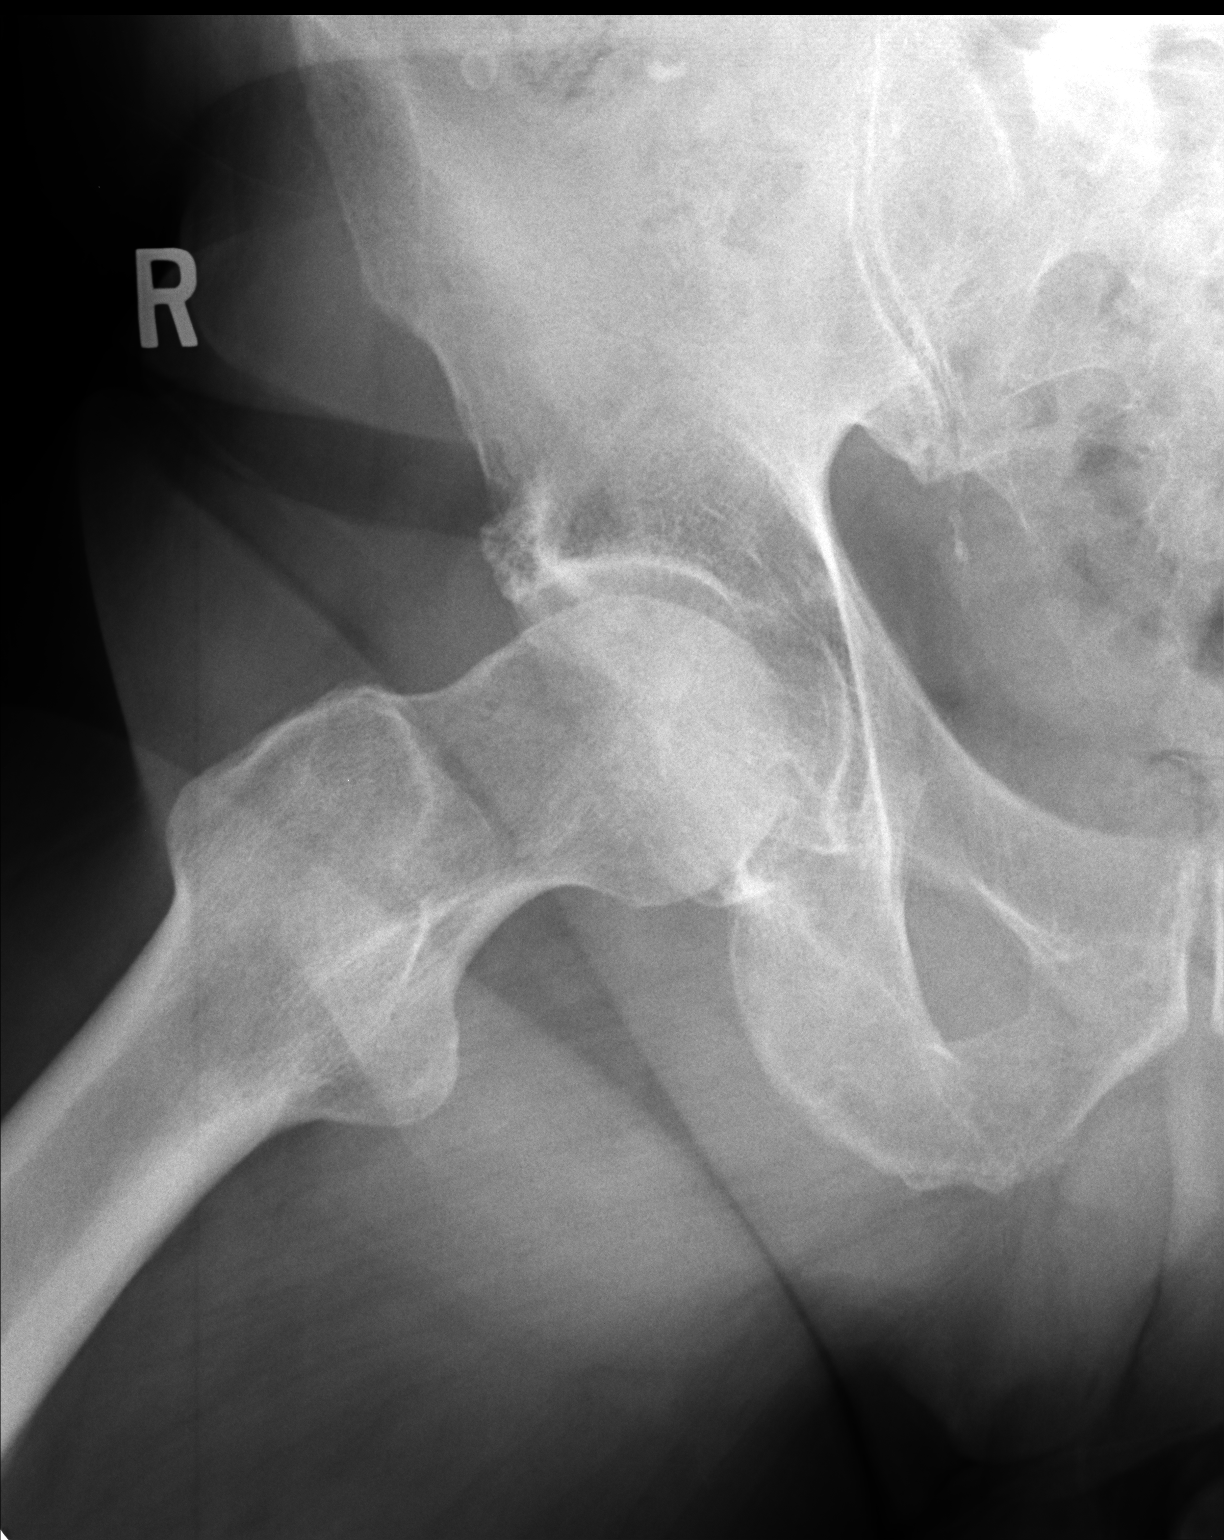

[AP (2 of 2)]
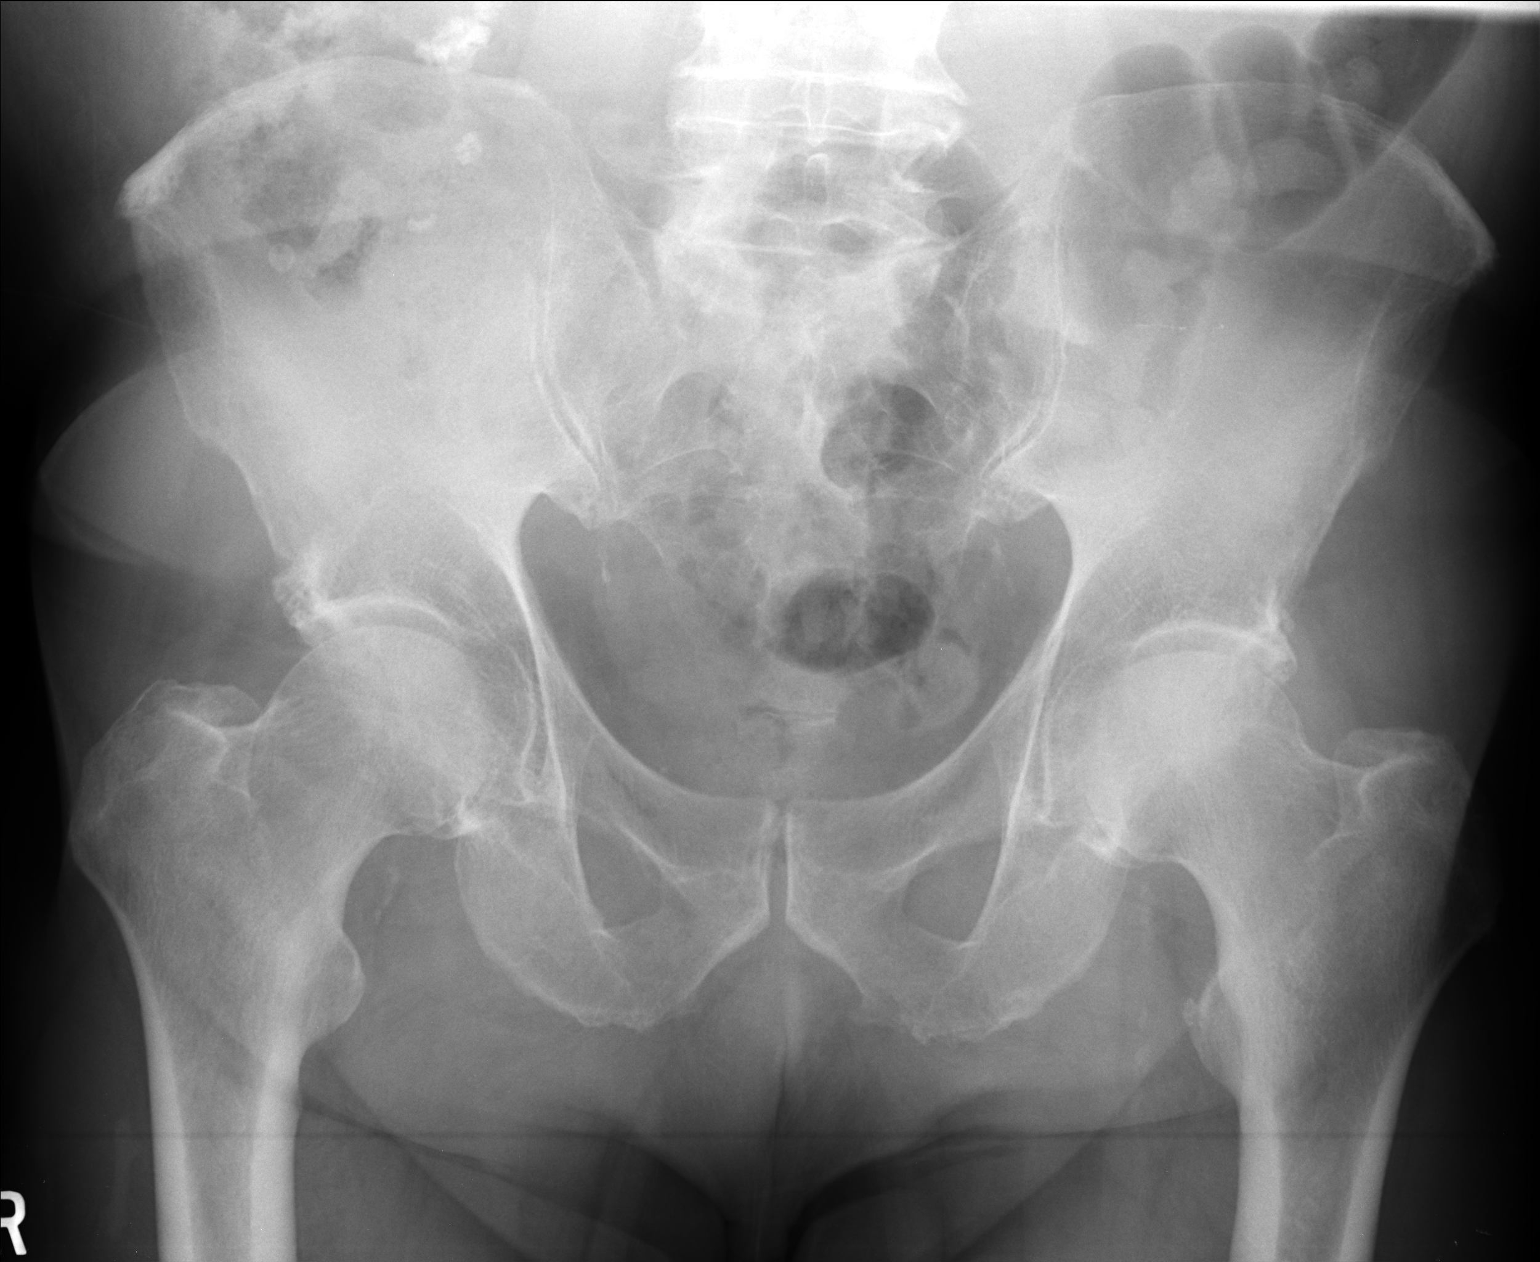

[3 of 3 positions shown; findings below may reference images not displayed]

FINDINGS: The mineralization and alignment are normal. There is no evidence of
acute fracture or dislocation. There are mild degenerative changes
of both hips and sacroiliac joints. No evidence of femoral head
avascular necrosis. Scattered vascular calcifications are noted.
IMPRESSION: No acute osseous findings. Mild degenerative changes of the hips and
sacroiliac joints.

## 2015-05-30 NOTE — Progress Notes (Signed)
Patient ID: [name], male    DOB: 03-06-1933, 82 y.o.   MRN: [id number]  PCP: Odette Fraction, MD  Subjective:   Chief Complaint  Patient presents with  . Hip Pain    R hip. x3 days. Felt something pop on Friday after getting out of his car.    HPI Presents for evaluation of RIGHT hip pain x 3 days. He is accompanied by his wife.  3 days ago, he was getting out of the front passenger seat of his wife's vehicle, which is quite low to the ground, and felt his RIGHT hip "pop out. I smacked it back." Initially, it was "Really super painful when walking." The pain has significantly improved, except when he first gets up after sitting for a while. Limps when he first stands due to the pain, then is eases off and resolves. Can hear a clicking sound when he walks, but it goes away when the pain resolves. Has tried nothing to alleviate his pain. No pain when sitting.  Initially he also had pain in the knee, but that has resolved.  No radicular pain. No loss of control of bowels/bladder. No saddle anesthesia. No history of previous hip pain, injury or dislocation.    Review of Systems As above.    Patient Active Problem List   Diagnosis Date Noted  . Eosinophilia   . Need for prophylactic vaccination and inoculation against influenza 11/12/2013  . Nocturnal leg cramps 11/23/2012  . Pain in joint, shoulder region 11/23/2012  . Fall 11/04/2012  . Hematoma 11/04/2012  . Rib pain 11/04/2012  . Acute upper respiratory infections of unspecified site 11/04/2012  . HYPERLIPIDEMIA TYPE IIB / III 03/08/2008  . HYPERTENSION, BENIGN 03/08/2008  . CAD, NATIVE VESSEL 03/08/2008     Prior to Admission medications   Medication Sig Start Date End Date Taking? Authorizing Provider  ADVAIR DISKUS 250-50 MCG/DOSE AEPB INHALE 1 PUFF INTO THE LUNGS 2 (TWO) TIMES DAILY. 11/26/14  Yes [name], MD  amLODipine-benazepril (LOTREL) 5-20 MG capsule Take 1 capsule by mouth 2  (two) times daily. 11/24/14  Yes [name], MD  aspirin EC 81 MG tablet Take 1 tablet (81 mg total) by mouth daily. 02/19/13  Yes Scott Joylene Draft, PA-C  atorvastatin (LIPITOR) 20 MG tablet Take 1 tablet (20 mg total) by mouth daily. 03/08/15  Yes [name], MD  Calcium Carbonate-Vitamin D (CALCARB 600/D) 600-400 MG-UNIT per tablet Take by mouth daily.     Yes Historical Provider, MD  CLINPRO 5000 1.1 % PSTE BRUSH WITH A PEA SIZED AMOUNT FOR 2 MINUTES, RINSE AND SPIT *DO NOT EAT/DRINK FOR 30 MIN* daily 06/17/14  Yes Historical Provider, MD  Coenzyme Q-10 100 MG capsule Take 100 mg by mouth daily.   Yes Historical Provider, MD  DHEA 50 MG CAPS Take 1 capsule by mouth daily with breakfast. T-Boost   Yes Historical Provider, MD  doxazosin (CARDURA) 2 MG tablet Take 1 tablet (2 mg total) by mouth daily. 03/22/15  Yes [name], MD  fluticasone (FLONASE) 50 MCG/ACT nasal spray Place 2 sprays into both nostrils daily. 03/23/14  Yes [name], MD  ibuprofen (ADVIL,MOTRIN) 600 MG tablet Take 1 tablet (600 mg total) by mouth every 6 (six) hours as needed for pain. 11/03/12  Yes Alycia Rossetti, MD  metoprolol succinate (TOPROL-XL) 25 MG 24 hr tablet Take 1 tablet (25 mg total) by mouth daily. 03/08/15  Yes [name], MD  Multiple Vitamin (MULTIVITAMIN) capsule Take 1 capsule by mouth daily. Resetigen-D  Cellular rejuvenation formula   Yes Historical Provider, MD  NON FORMULARY Omega Q 1 tab twice daily   Yes Historical Provider, MD  OVER THE COUNTER MEDICATION Take 1 tablet by mouth daily. Juvenon  Cellular health supplement   Yes Historical Provider, MD  tamsulosin (FLOMAX) 0.4 MG CAPS capsule TAKE 1 CAPSULE (0.4 MG TOTAL) BY MOUTH DAILY. 04/11/15  Yes [name], MD  VENTOLIN HFA 108 (90 BASE) MCG/ACT inhaler INHALE 2 PUFFS INTO THE LUNGS EVERY 6 (SIX) HOURS AS NEEDED FOR WHEEZING OR SHORTNESS OF BREATH. 02/18/14  Yes [name], MD     No Known Allergies       Objective:  Physical Exam  Constitutional: He is oriented to person, place, and time. He appears well-developed and well-nourished. He is active and cooperative. No distress.  BP 114/82 mmHg  Pulse 69  Temp(Src) 97.8 F (36.6 C) (Oral)  Resp 18  Ht 5\' 7"  (1.702 m)  Wt 200 lb (90.719 kg)  BMI 31.32 kg/m2  SpO2 98%  HENT:  Head: Normocephalic and atraumatic.  Right Ear: Hearing normal.  Left Ear: Hearing normal.  Eyes: Conjunctivae are normal. No scleral icterus.  Neck: Normal range of motion. Neck supple. No thyromegaly present.  Cardiovascular: Normal rate, regular rhythm and normal heart sounds.   Pulses:      Radial pulses are 2+ on the right side, and 2+ on the left side.       Dorsalis pedis pulses are 2+ on the right side, and 2+ on the left side.       Posterior tibial pulses are 2+ on the right side, and 2+ on the left side.  Pulmonary/Chest: Effort normal and breath sounds normal.  Musculoskeletal:       Right hip: He exhibits decreased range of motion and bony tenderness (greater trochanter, point tenderness). He exhibits normal strength, no tenderness, no swelling, no crepitus, no deformity and no laceration.       Left hip: He exhibits decreased range of motion. He exhibits normal strength, no tenderness, no bony tenderness, no swelling, no crepitus, no deformity and no laceration.       Right knee: Normal.  Both hips with reduced ROM, but without pain on PROM. Anterior hip pain with flexion against resistance.  Lymphadenopathy:       Head (right side): No tonsillar, no preauricular, no posterior auricular and no occipital adenopathy present.       Head (left side): No tonsillar, no preauricular, no posterior auricular and no occipital adenopathy present.    He has no cervical adenopathy.       Right: No supraclavicular adenopathy present.       Left: No supraclavicular adenopathy present.  Neurological: He is alert and oriented to person, place, and time. He has  normal strength. No sensory deficit.  Reflex Scores:      Patellar reflexes are 2+ on the right side and 2+ on the left side.      Achilles reflexes are 2+ on the right side and 2+ on the left side. Skin: Skin is warm, dry and intact. No rash noted. No cyanosis or erythema. Nails show no clubbing.  Psychiatric: He has a normal mood and affect. His speech is normal and behavior is normal.      Dg Hip Unilat W Or W/o Pelvis 2-3 Views Right  05/30/2015  CLINICAL DATA:  82 year old with right hip pain  and popping for 3 days. EXAM: DG HIP (WITH OR WITHOUT PELVIS) 2-3V RIGHT COMPARISON:  None. FINDINGS: The mineralization and alignment are normal. There is no evidence of acute fracture or dislocation. There are mild degenerative changes of both hips and sacroiliac joints. No evidence of femoral head avascular necrosis. Scattered vascular calcifications are noted. IMPRESSION: No acute osseous findings. Mild degenerative changes of the hips and sacroiliac joints. Electronically Signed   By: Richardean Sale M.D.   On: 05/30/2015 16:46        Assessment & Plan:   1. Right hip pain Possible dislocation and spontaneous reduction. Radiographs are reassuring. Reduced renal function noted, so advise against NSAIDS. Acetaminophen as needed. RTC or see PCP if symptoms worsen/persist or if he feels the hip dislocate again. - DG HIP UNILAT W OR W/O PELVIS 2-3 VIEWS RIGHT; Future   Fara Chute, PA-C Physician Assistant-Certified [facility]

## 2015-05-30 NOTE — Patient Instructions (Signed)
     IF you received an x-ray today, you will receive an invoice from [facility]. Please contact [facility] at [phone] with questions or concerns regarding your invoice.   IF you received labwork today, you will receive an invoice from [facility]. Please contact [facility] at [phone] with questions or concerns regarding your invoice.   Our billing staff will not be able to assist you with questions regarding bills from these companies.  You will be contacted with the lab results as soon as they are available. The fastest way to get your results is to activate your My Chart account. Instructions are located on the last page of this paperwork. If you have not heard from us regarding the results in 2 weeks, please contact this office.      

## 2015-07-23 ENCOUNTER — Other Ambulatory Visit: Payer: Self-pay | Admitting: Family Medicine

## 2015-07-26 NOTE — Telephone Encounter (Signed)
Refill appropriate and filled per protocol. 

## 2015-09-15 ENCOUNTER — Other Ambulatory Visit: Payer: Self-pay | Admitting: Family Medicine

## 2015-09-15 DIAGNOSIS — Z0000 Encounter for general adult medical examination without abnormal findings: Secondary | ICD-10-CM

## 2015-09-16 ENCOUNTER — Other Ambulatory Visit: Payer: Self-pay | Admitting: Family Medicine

## 2015-10-09 ENCOUNTER — Other Ambulatory Visit: Payer: Self-pay | Admitting: Cardiovascular Disease

## 2015-10-09 DIAGNOSIS — I2510 Atherosclerotic heart disease of native coronary artery without angina pectoris: Secondary | ICD-10-CM

## 2015-10-09 DIAGNOSIS — I10 Essential (primary) hypertension: Secondary | ICD-10-CM

## 2015-10-16 IMAGING — US US ABDOMEN LIMITED
1 series · 14 of 25 positions shown · non-contrast
Comparison: [DATE] .  CT [DATE].

CLINICAL DATA: Elevated liver function test.

EXAM:
US ABDOMEN LIMITED - RIGHT UPPER QUADRANT

[Series 1: us abdomen limited · 0.14mm/px · 14 of 46 slices shown]
[im 1/46]
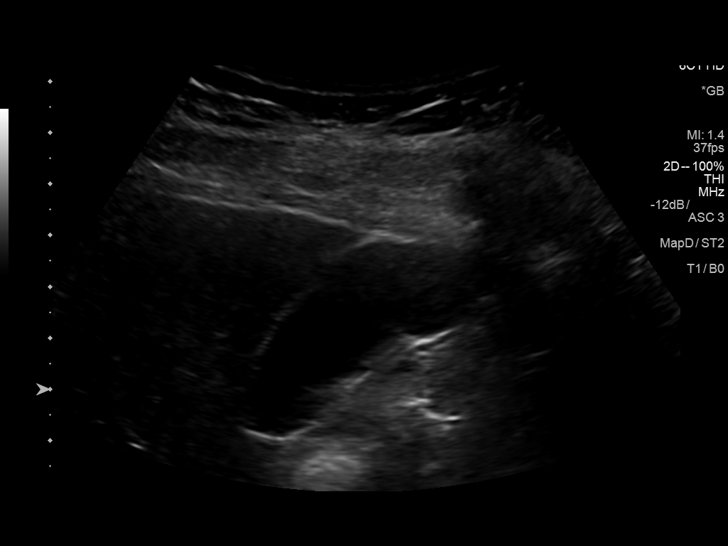
[im 4/46]
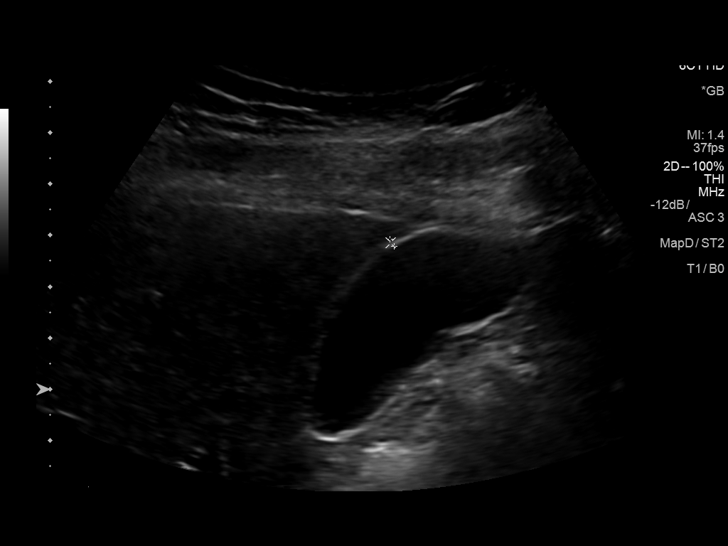
[im 8/46]
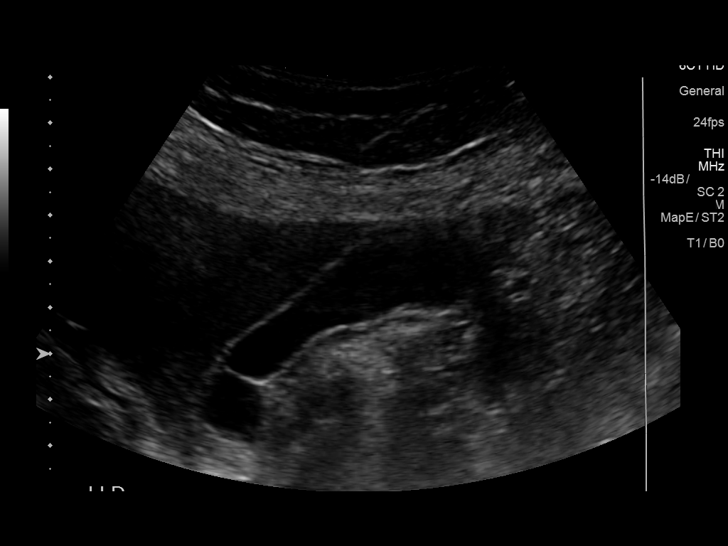
[im 12/46]
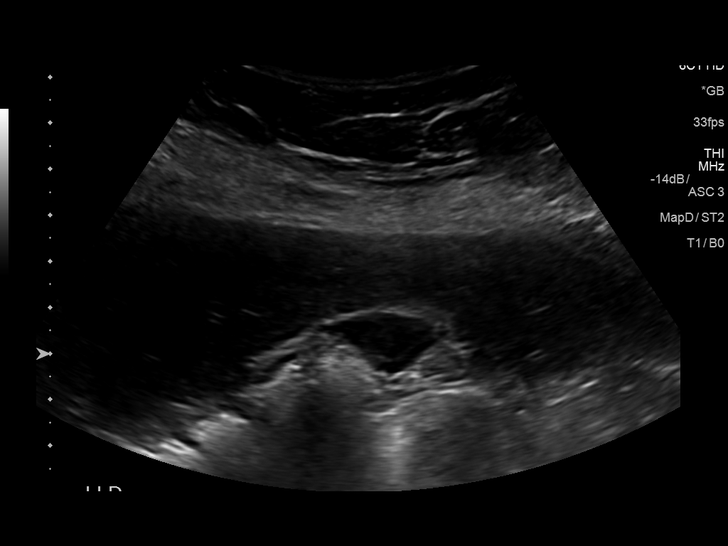
[im 16/46]
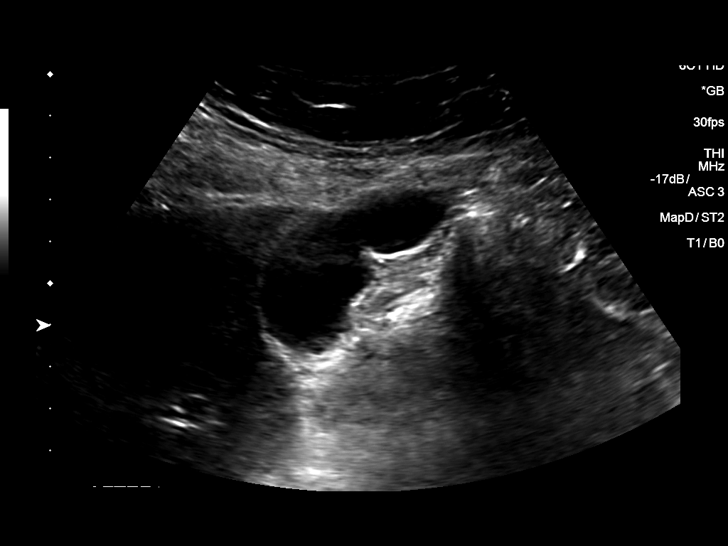
[im 17/46]
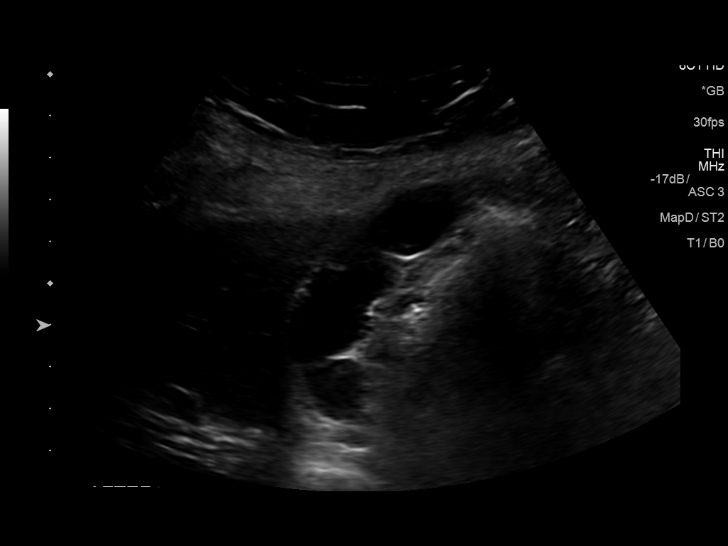
[im 21/46]
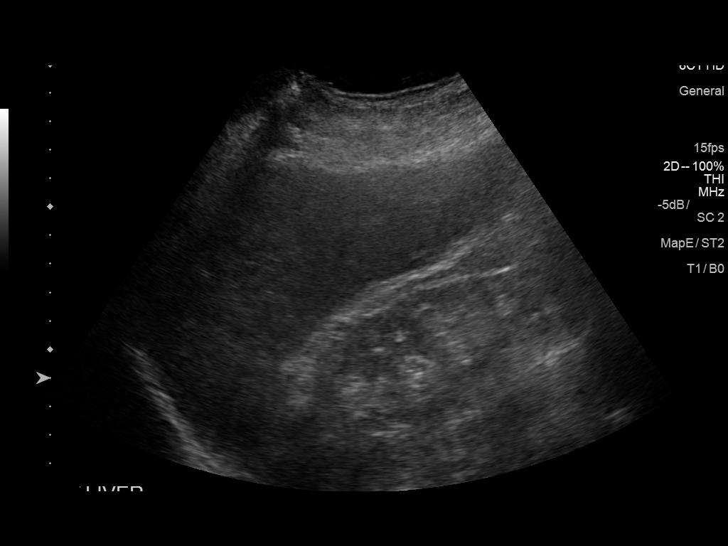
[im 25/46]
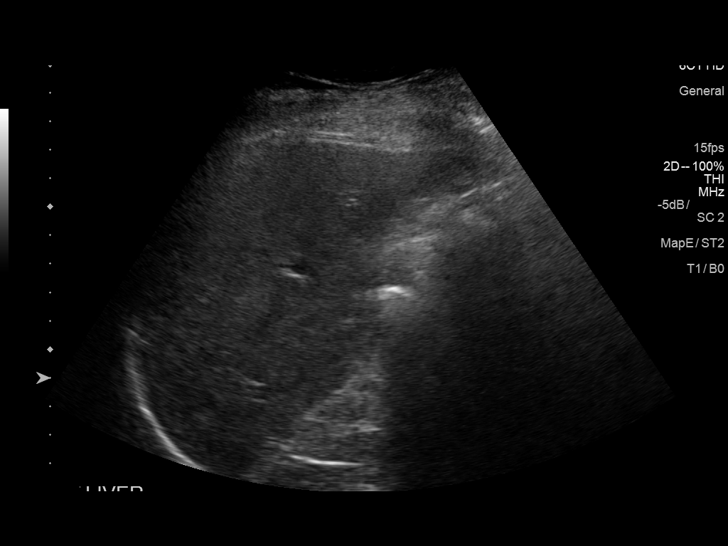
[im 29/46]
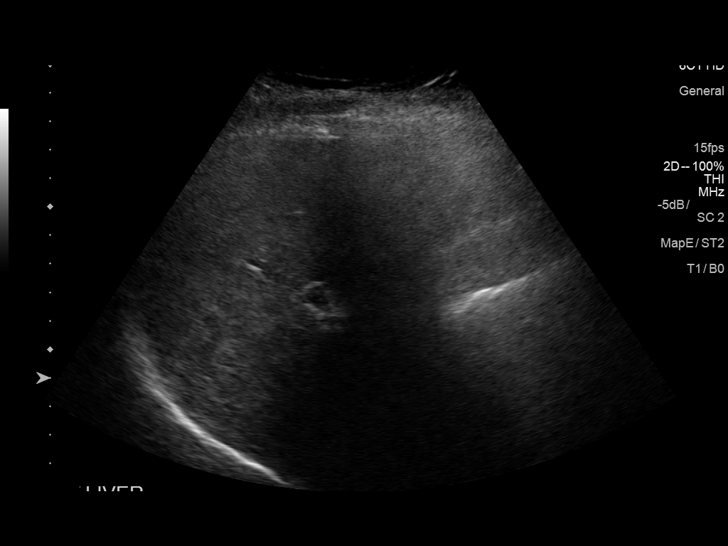
[im 31/46]
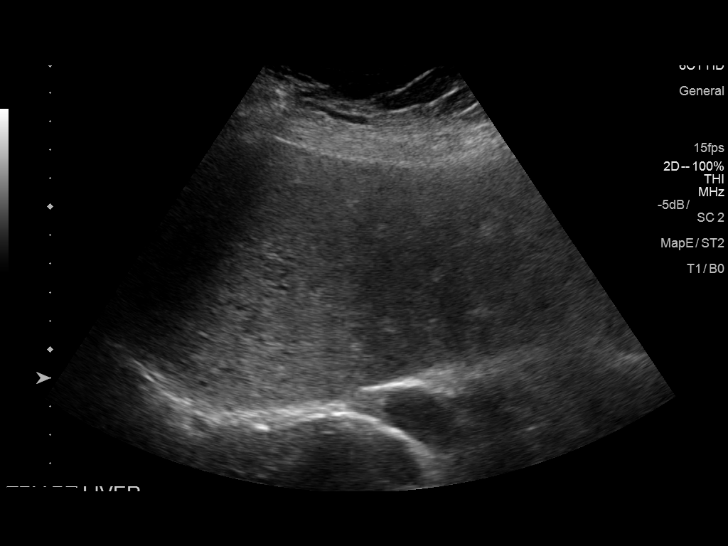
[im 34/46]
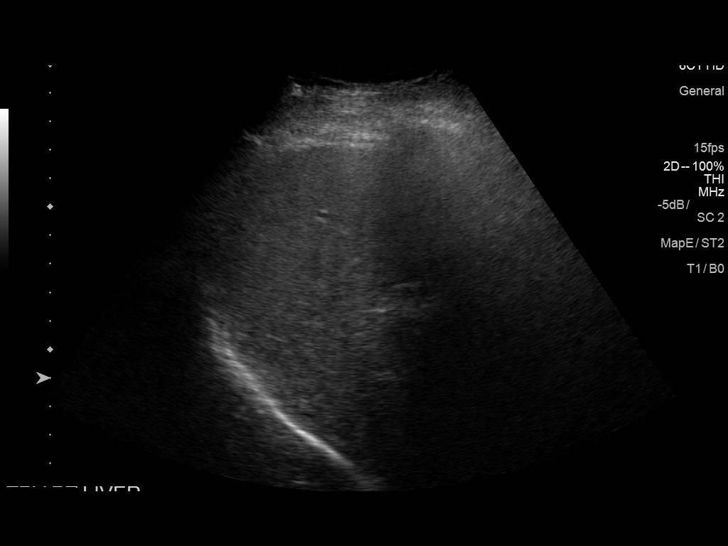
[im 38/46]
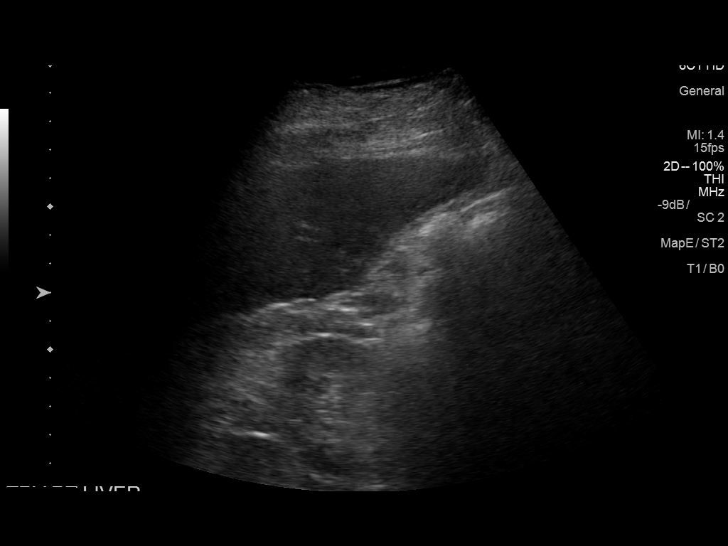
[im 42/46]
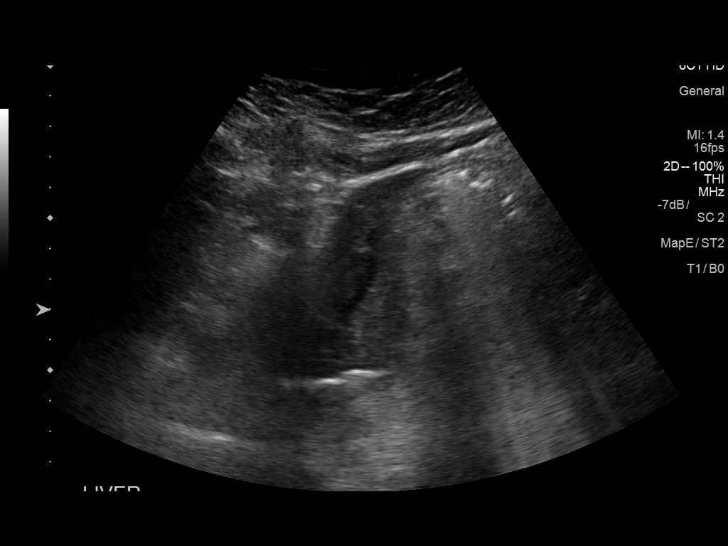
[im 46/46]
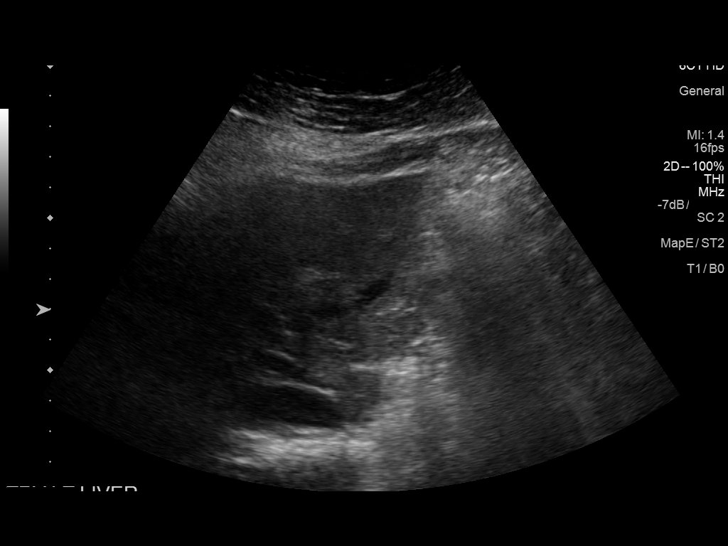

[14 of 25 positions shown; findings below may reference images not displayed]

FINDINGS: Gallbladder:

No gallstones or wall thickening visualized. No sonographic Murphy
sign noted by sonographer.

Common bile duct:

Diameter: 2.4 mm

Liver:

Increased echogenicity consistent fatty infiltration and/or
hepatocellular disease. No focal hepatic abnormality identified.
IMPRESSION: 1. Increased echogenicity liver consistent fatty infiltration and/or
pedis 82 disease. No focal hepatic abnormality identified.

2. No gallstones or biliary distention.

## 2015-10-17 ENCOUNTER — Encounter: Payer: Self-pay | Admitting: Cardiovascular Disease

## 2015-10-17 ENCOUNTER — Ambulatory Visit (INDEPENDENT_AMBULATORY_CARE_PROVIDER_SITE_OTHER): Payer: PPO | Admitting: Cardiovascular Disease

## 2015-10-17 VITALS — BP 130/68 | HR 62 | Ht 67.5 in | Wt 200.2 lb

## 2015-10-17 DIAGNOSIS — I10 Essential (primary) hypertension: Secondary | ICD-10-CM

## 2015-10-17 DIAGNOSIS — I2510 Atherosclerotic heart disease of native coronary artery without angina pectoris: Secondary | ICD-10-CM | POA: Diagnosis not present

## 2015-10-17 NOTE — Patient Instructions (Signed)
Medication Instructions:  Your physician recommends that you continue on your current medications as directed. Please refer to the Current Medication list given to you today.   Labwork: No new orders  Testing/Procedures: NO new orders  Follow-Up: Your physician wants you to follow-up in:1 year with Dr.Cooper.  You will receive a reminder letter in the mail two months in advance. If you don't receive a letter, please call our office to schedule the follow-up appointment.   Any Other Special Instructions Will Be Listed Below (If Applicable).     If you need a refill on your cardiac medications before your next appointment, please call your pharmacy.

## 2015-10-17 NOTE — Progress Notes (Signed)
Cardiology Office Note Date:  10/17/2015   ID:  [name], DOB Feb 08, 1933, MRN [id number]  PCP:  Odette Fraction, MD  Cardiologist:  Sherren Mocha, MD    Chief Complaint  Patient presents with  . Coronary Artery Disease     History of Present Illness: [name] is a 82 y.o. male who presents for follow-up evaluation. The patient has been followed for coronary artery disease, hypertension, and hyperlipidemia. The patient underwent PCI (Cypher DES) of the proximal LAD in 2008. He had moderate diffuse residual CAD and medical therapy was recommended. The patient had an exercise treadmill study in 2014 and this demonstrated no significant ST/T changes.  The patient is doing very well. He's had some mild swelling in the right ankle. No other complaints. No chest pain, shortness of breath, heart palpitations, orthopnea, or PND.His blood pressure has been very well controlled on amlodipine. Generally his systolic pressure is in the 120s.  Past Medical History:  Diagnosis Date  . Allergy   . BPH (benign prostatic hypertrophy)   . CAD (coronary artery disease)    s/p cypher DES to pLAD 6/08; normal LVF;  ETT-Myoview 2009: no ischemia   . Eosinophilia   . HTN (hypertension)   . Hyperlipidemia   . Myocardial infarction [facility])     Past Surgical History:  Procedure Laterality Date  . APPENDECTOMY    . CARDIAC CATHETERIZATION  07/30/2006   CORONARY ANGIOPLASTY WITH STENT PLACEMENT  . EXPLORATORY LAPAROTOMY     age 79    Current Outpatient Prescriptions  Medication Sig Dispense Refill  . ADVAIR DISKUS 250-50 MCG/DOSE AEPB INHALE 1 PUFF INTO THE LUNGS 2 (TWO) TIMES DAILY. 60 each 5  . amLODipine-benazepril (LOTREL) 5-20 MG capsule Take 1 capsule by mouth 2 (two) times daily. 180 capsule 3  . aspirin EC 81 MG tablet Take 1 tablet (81 mg total) by mouth daily.    Marland Kitchen atorvastatin (LIPITOR) 20 MG tablet Take 1 tablet (20 mg total) by mouth daily. 90 tablet 3  . Calcium  Carbonate-Vitamin D (CALCARB 600/D) 600-400 MG-UNIT per tablet Take 1 tablet by mouth daily.     . Coenzyme Q-10 100 MG capsule Take 100 mg by mouth daily.    Marland Kitchen DHEA 50 MG CAPS Take 1 capsule by mouth daily with breakfast. T-Boost    . doxazosin (CARDURA) 2 MG tablet TAKE 1 TABLET (2 MG TOTAL) BY MOUTH DAILY. 30 tablet 5  . fluticasone (FLONASE) 50 MCG/ACT nasal spray PLACE 2 SPRAYS INTO BOTH NOSTRILS DAILY. 16 g 5  . ibuprofen (ADVIL,MOTRIN) 600 MG tablet Take 1 tablet (600 mg total) by mouth every 6 (six) hours as needed for pain. 30 tablet 1  . Multiple Vitamin (MULTIVITAMIN) capsule Take 1 capsule by mouth daily. Resetigen-D  Cellular rejuvenation formula    . NON FORMULARY Take 1 tablet by mouth 2 (two) times daily. Omega Q 1 tab twice daily     . OVER THE COUNTER MEDICATION Take 1 tablet by mouth daily. Juvenon  Cellular health supplement    . VENTOLIN HFA 108 (90 Base) MCG/ACT inhaler INHALE 2 PUFFS INTO THE LUNGS EVERY 6 (SIX) HOURS AS NEEDED FOR WHEEZING OR SHORTNESS OF BREATH. 18 Inhaler 1   No current facility-administered medications for this visit.     Allergies:   Review of patient's allergies indicates no known allergies.   Social History:  The patient  reports that he has quit smoking. He has never used smokeless tobacco. He reports  that he drinks about 2.4 - 3.6 oz of alcohol per week . He reports that he does not use drugs.   Family History:  The patient's  family history includes Aortic aneurysm in his brother; Arthritis in his daughter; Colon cancer in his brother; Coronary artery disease in his brother; Coronary artery disease (age of onset: 29) in his father; Stomach cancer in his mother.    ROS:  Please see the history of present illness.   All other systems are reviewed and negative.    PHYSICAL EXAM: VS:  BP 130/68   Pulse 62   Ht 5' 7.5" (1.715 m)   Wt 90.8 kg (200 lb 3.2 oz)   BMI 30.89 kg/m  , BMI Body mass index is 30.89 kg/m. GEN: Well nourished, well  developed, in no acute distress  HEENT: normal  Neck: no JVD, no masses. No carotid bruits Cardiac: RRR without murmur or gallop                Respiratory:  clear to auscultation bilaterally, normal work of breathing GI: soft, nontender, nondistended, + BS MS: no deformity or atrophy  Ext: trace right ankle edema edema, pedal pulses 2+= bilaterally Skin: warm and dry, no rash Neuro:  Strength and sensation are intact Psych: euthymic mood, full affect  EKG:  EKG is ordered today. The ekg ordered today shows normal sinus rhythm 62 bpm, nonspecific T wave abnormality.  Recent Labs: 03/03/2015: ALT 14; Hemoglobin 13.5; Platelets 208 04/12/2015: BUN 22; Creat 1.32; Potassium 4.4; Sodium 140   Lipid Panel     Component Value Date/Time   CHOL 160 03/03/2015 0811   TRIG 42 03/03/2015 0811   HDL 48 03/03/2015 0811   CHOLHDL 3.3 03/03/2015 0811   VLDL 8 03/03/2015 0811   LDLCALC 104 03/03/2015 0811      Wt Readings from Last 3 Encounters:  10/17/15 90.8 kg (200 lb 3.2 oz)  05/30/15 90.7 kg (200 lb)  04/12/15 90.7 kg (200 lb)     ASSESSMENT AND PLAN: 1.  Essential HTN: BP well-controlled today. Continue amlodipine/benazepril.  2. CAD, native vessel, without angina: Doing well after remote PCI. No anginal symptoms. His previous anginal equivalent was shortness of breath with exertion.  3. Hyperlipidemia: Last lipids reviewed with LDL 104, HDL 48, total cholesterol 160. He was started on atorvastatin by his primary care physician and follow-up lipids have been arranged. He continues to work on lifestyle modification.  Current medicines are reviewed with the patient today.  The patient does not have concerns regarding medicines.  Labs/ tests ordered today include:  No orders of the defined types were placed in this encounter.  Disposition:   FU one year  Signed, Sherren Mocha, MD  10/17/2015 8:04 AM    [facility] [place], [place], [place]   [address] Phone: [phone]; Fax: [phone]

## 2015-10-20 ENCOUNTER — Ambulatory Visit (INDEPENDENT_AMBULATORY_CARE_PROVIDER_SITE_OTHER): Payer: PPO | Admitting: *Deleted

## 2015-10-20 DIAGNOSIS — Z23 Encounter for immunization: Secondary | ICD-10-CM

## 2015-10-20 NOTE — Progress Notes (Signed)
Patient seen in office for Influenza Vaccination.   Tolerated IM administration well.   Immunization history updated.  

## 2015-12-06 ENCOUNTER — Ambulatory Visit (INDEPENDENT_AMBULATORY_CARE_PROVIDER_SITE_OTHER): Payer: PPO | Admitting: Family Medicine

## 2015-12-06 ENCOUNTER — Encounter: Payer: Self-pay | Admitting: Family Medicine

## 2015-12-06 VITALS — BP 132/86 | HR 68 | Temp 98.3°F | Resp 18 | Wt 200.0 lb

## 2015-12-06 DIAGNOSIS — J208 Acute bronchitis due to other specified organisms: Secondary | ICD-10-CM

## 2015-12-06 MED ORDER — HYDROCODONE-HOMATROPINE 5-1.5 MG/5ML PO SYRP: 5.0000 mL | ORAL_SOLUTION | Freq: Three times a day (TID) | ORAL | 0 refills | Status: DC | PRN

## 2015-12-06 MED ORDER — AZITHROMYCIN 250 MG PO TABS: ORAL_TABLET | ORAL | 0 refills | Status: DC

## 2015-12-06 NOTE — Progress Notes (Signed)
Subjective:    Patient ID: [name], male    DOB: 1933-10-25, 82 y.o.   MRN: [id number]  HPI Patient is a very pleasant 82 year old Caucasian male who became sick last Wednesday. Began with rhinorrhea and sneezing. It rapidly progressed into a chest infection. He has had a nonproductive cough now for 6 or 7 days. At times he is unable to stop coughing. He denies any fevers. He denies any chest pain. He denies any shortness of breath. He denies any pleurisy. He denies any hemoptysis. He has had 2 episodes where he coughed up clear sputum. Overall he feels better than he did a week ago. He still feels very tired and fatigued. Past Medical History:  Diagnosis Date  . Allergy   . BPH (benign prostatic hypertrophy)   . CAD (coronary artery disease)    s/p cypher DES to pLAD 6/08; normal LVF;  ETT-Myoview 2009: no ischemia   . Eosinophilia   . HTN (hypertension)   . Hyperlipidemia   . Myocardial infarction    Past Surgical History:  Procedure Laterality Date  . APPENDECTOMY    . CARDIAC CATHETERIZATION  07/30/2006   CORONARY ANGIOPLASTY WITH STENT PLACEMENT  . EXPLORATORY LAPAROTOMY     age 89   Current Outpatient Prescriptions on File Prior to Visit  Medication Sig Dispense Refill  . ADVAIR DISKUS 250-50 MCG/DOSE AEPB INHALE 1 PUFF INTO THE LUNGS 2 (TWO) TIMES DAILY. 60 each 5  . amLODipine-benazepril (LOTREL) 5-20 MG capsule Take 1 capsule by mouth 2 (two) times daily. 180 capsule 3  . aspirin EC 81 MG tablet Take 1 tablet (81 mg total) by mouth daily.    Marland Kitchen atorvastatin (LIPITOR) 20 MG tablet Take 1 tablet (20 mg total) by mouth daily. 90 tablet 3  . Calcium Carbonate-Vitamin D (CALCARB 600/D) 600-400 MG-UNIT per tablet Take 1 tablet by mouth daily.     . Coenzyme Q-10 100 MG capsule Take 100 mg by mouth daily.    Marland Kitchen DHEA 50 MG CAPS Take 1 capsule by mouth daily with breakfast. T-Boost    . doxazosin (CARDURA) 2 MG tablet TAKE 1 TABLET (2 MG TOTAL) BY MOUTH DAILY. 30 tablet 5    . fluticasone (FLONASE) 50 MCG/ACT nasal spray PLACE 2 SPRAYS INTO BOTH NOSTRILS DAILY. 16 g 5  . ibuprofen (ADVIL,MOTRIN) 600 MG tablet Take 1 tablet (600 mg total) by mouth every 6 (six) hours as needed for pain. 30 tablet 1  . Multiple Vitamin (MULTIVITAMIN) capsule Take 1 capsule by mouth daily. Resetigen-D  Cellular rejuvenation formula    . NON FORMULARY Take 1 tablet by mouth 2 (two) times daily. Omega Q 1 tab twice daily     . OVER THE COUNTER MEDICATION Take 1 tablet by mouth daily. Juvenon  Cellular health supplement    . VENTOLIN HFA 108 (90 Base) MCG/ACT inhaler INHALE 2 PUFFS INTO THE LUNGS EVERY 6 (SIX) HOURS AS NEEDED FOR WHEEZING OR SHORTNESS OF BREATH. 18 Inhaler 1   No current facility-administered medications on file prior to visit.    No Known Allergies Social History   Social History  . Marital status: Married    Spouse name: [name]  . Number of children: 1  . Years of education: N/A   Occupational History  . retired professor     History   . missionary    Social History Main Topics  . Smoking status: Former Research scientist (life sciences)  . Smokeless tobacco: Never Used     Comment:  quit over 11yrs ago.  . Alcohol use 2.4 - 3.6 oz/week    4 - 6 Glasses of wine per week  . Drug use: No  . Sexual activity: Yes    Partners: Female   Other Topics Concern  . Not on file   Social History Narrative   Lives with his wife (second marriage in 2015, first marriage ended when his wife died alzheimer's disease). Since his remarriage, his adult daughter doesn't speak with him.      Review of Systems  All other systems reviewed and are negative.      Objective:   Physical Exam  Constitutional: He appears well-developed and well-nourished. No distress.  HENT:  Head: Normocephalic and atraumatic.  Right Ear: External ear normal.  Left Ear: External ear normal.  Nose: Nose normal.  Mouth/Throat: Oropharynx is clear and moist.  Eyes: Conjunctivae are normal. Right eye exhibits no  discharge. Left eye exhibits no discharge. No scleral icterus.  Neck: Neck supple.  Cardiovascular: Normal rate, regular rhythm and normal heart sounds.   No murmur heard. Pulmonary/Chest: Effort normal and breath sounds normal. No respiratory distress. He has no wheezes. He has no rales. He exhibits no tenderness.  Lymphadenopathy:    He has no cervical adenopathy.  Skin: He is not diaphoretic.  Vitals reviewed.         Assessment & Plan:  Acute bronchitis due to other specified organisms - Plan: azithromycin (ZITHROMAX) 250 MG tablet, HYDROcodone-homatropine (HYCODAN) 5-1.5 MG/5ML syrup  Patient symptoms are consistent with a viral respiratory infection and I believe file bronchitis. I recommended tincture of time. This can typically last 10-14 days. At the present time he is afebrile with no shortness of breath. I recommended Hycodan cough syrup 1 teaspoon every 8 hours as needed for cough. Given his age and his coronary artery disease, I did give him a prescription for a Z-Pak. I explained to him not to get the prescription unless he develops a high fever, purulent sputum, or the symptoms worsen rather than improve over the next 3-4 days.

## 2015-12-25 DIAGNOSIS — M19071 Primary osteoarthritis, right ankle and foot: Secondary | ICD-10-CM | POA: Diagnosis not present

## 2016-01-12 ENCOUNTER — Ambulatory Visit (INDEPENDENT_AMBULATORY_CARE_PROVIDER_SITE_OTHER): Payer: PPO | Admitting: Family Medicine

## 2016-01-12 ENCOUNTER — Encounter: Payer: Self-pay | Admitting: Family Medicine

## 2016-01-12 VITALS — BP 110/60 | HR 62 | Temp 97.7°F | Resp 14 | Ht 67.0 in | Wt 203.0 lb

## 2016-01-12 DIAGNOSIS — F409 Phobic anxiety disorder, unspecified: Secondary | ICD-10-CM

## 2016-01-12 DIAGNOSIS — F5105 Insomnia due to other mental disorder: Secondary | ICD-10-CM | POA: Diagnosis not present

## 2016-01-12 MED ORDER — RAMELTEON 8 MG PO TABS: 8.0000 mg | ORAL_TABLET | Freq: Every day | ORAL | 2 refills | Status: DC

## 2016-01-12 NOTE — Progress Notes (Signed)
Subjective:    Patient ID: [name], male    DOB: [birth year], 82 y.o.   MRN: [id number]  HPI Patient has a papule anterior to his left ear that has been there for some time. It is light brown in color. It has a waxy surface when scratched. It is not painful or bleeding or changing in size. Exam is consistent with seborrheic keratosis. I reassured the patient I thought it was benign. We could biopsy it if it begins to grow or change. His second concern is an atypical sleep disturbance. He states that he has no problems falling asleep. However no matter what time he goes to sleep whether it's 8, 10, or 12:00 at night, he will consistently break up at 2:30 in the morning. When he wakes up, he frequently has a sense of overwhelming sadness almost to the point that he cannot move. He feels completely alone and in total desolation. He also feels separate from God.  Symptoms last 20 minutes to an hour and then resolve spontaneously. He denies any chest pain or shortness of breath or seizure-like activity. He denies any apnea. During the day he feels fine. He is having no panic attacks during the day. He denies any depression, anhedonia. He denies any suicidal thoughts, hallucinations, or delusions. I do believe the patient's social history is extremely important. He is a Company secretary. He is a devout man of faith.  Therefore, I believe the symptoms may be his manifestation of an anxiety attack triggered by sleep disruption. I include this only because the patient has no fear of death. Therefore I do not believe that the father of death would be anxiety provoking to this patient due to his strong faith. However his severe sadness and desolation.   Past Medical History:  Diagnosis Date  . Allergy   . BPH (benign prostatic hypertrophy)   . CAD (coronary artery disease)    s/p cypher DES to pLAD 6/08; normal LVF;  ETT-Myoview 2009: no ischemia   . Eosinophilia   . HTN (hypertension)   . Hyperlipidemia   .  Myocardial infarction    Past Surgical History:  Procedure Laterality Date  . APPENDECTOMY    . CARDIAC CATHETERIZATION  07/30/2006   CORONARY ANGIOPLASTY WITH STENT PLACEMENT  . EXPLORATORY LAPAROTOMY     age 75   Current Outpatient Prescriptions on File Prior to Visit  Medication Sig Dispense Refill  . ADVAIR DISKUS 250-50 MCG/DOSE AEPB INHALE 1 PUFF INTO THE LUNGS 2 (TWO) TIMES DAILY. 60 each 5  . amLODipine-benazepril (LOTREL) 5-20 MG capsule Take 1 capsule by mouth 2 (two) times daily. 180 capsule 3  . aspirin EC 81 MG tablet Take 1 tablet (81 mg total) by mouth daily.    Marland Kitchen atorvastatin (LIPITOR) 20 MG tablet Take 1 tablet (20 mg total) by mouth daily. 90 tablet 3  . Calcium Carbonate-Vitamin D (CALCARB 600/D) 600-400 MG-UNIT per tablet Take 1 tablet by mouth daily.     . Coenzyme Q-10 100 MG capsule Take 100 mg by mouth daily.    Marland Kitchen DHEA 50 MG CAPS Take 1 capsule by mouth daily with breakfast. T-Boost    . doxazosin (CARDURA) 2 MG tablet TAKE 1 TABLET (2 MG TOTAL) BY MOUTH DAILY. 30 tablet 5  . fluticasone (FLONASE) 50 MCG/ACT nasal spray PLACE 2 SPRAYS INTO BOTH NOSTRILS DAILY. 16 g 5  . ibuprofen (ADVIL,MOTRIN) 600 MG tablet Take 1 tablet (600 mg total) by mouth every 6 (six) hours as  needed for pain. 30 tablet 1  . Multiple Vitamin (MULTIVITAMIN) capsule Take 1 capsule by mouth daily. Resetigen-D  Cellular rejuvenation formula    . NON FORMULARY Take 1 tablet by mouth 2 (two) times daily. Omega Q 1 tab twice daily     . OVER THE COUNTER MEDICATION Take 1 tablet by mouth daily. Juvenon  Cellular health supplement    . VENTOLIN HFA 108 (90 Base) MCG/ACT inhaler INHALE 2 PUFFS INTO THE LUNGS EVERY 6 (SIX) HOURS AS NEEDED FOR WHEEZING OR SHORTNESS OF BREATH. 18 Inhaler 1   No current facility-administered medications on file prior to visit.    No Known Allergies Social History   Social History  . Marital status: Married    Spouse name: [name]  . Number of children: 1  . Years  of education: N/A   Occupational History  . retired professor     History   . missionary    Social History Main Topics  . Smoking status: Former Research scientist (life sciences)  . Smokeless tobacco: Never Used     Comment: quit over 42yrs ago.  . Alcohol use 2.4 - 3.6 oz/week    4 - 6 Glasses of wine per week  . Drug use: No  . Sexual activity: Yes    Partners: Female   Other Topics Concern  . Not on file   Social History Narrative   Lives with his wife (second marriage in 2015, first marriage ended when his wife died alzheimer's disease). Since his remarriage, his adult daughter doesn't speak with him.      Review of Systems  All other systems reviewed and are negative.      Objective:   Physical Exam  Cardiovascular: Normal rate, regular rhythm and normal heart sounds.   Pulmonary/Chest: Effort normal and breath sounds normal. No respiratory distress. He has no wheezes. He has no rales.  Abdominal: Soft. Bowel sounds are normal. He exhibits no distension. There is no tenderness. There is no rebound and no guarding.  Psychiatric: He has a normal mood and affect. His behavior is normal. Judgment and thought content normal.  Vitals reviewed.         Assessment & Plan:  Insomnia due to anxiety and fear - Plan: ramelteon (ROZEREM) 8 MG tablet  Patient has no difficulty falling asleep but we definitely need medication they can help him stay asleep. Therefore I recommended that he begin rozerem 8 mg prior to bedtime. Recheck in 2-3 weeks to see if symptoms are improving. Recheck immediately should his symptoms change

## 2016-02-23 ENCOUNTER — Other Ambulatory Visit: Payer: Self-pay | Admitting: Family Medicine

## 2016-02-23 DIAGNOSIS — E785 Hyperlipidemia, unspecified: Secondary | ICD-10-CM

## 2016-03-14 ENCOUNTER — Other Ambulatory Visit: Payer: Self-pay | Admitting: Family Medicine

## 2016-03-14 DIAGNOSIS — F409 Phobic anxiety disorder, unspecified: Secondary | ICD-10-CM

## 2016-03-14 DIAGNOSIS — F5105 Insomnia due to other mental disorder: Secondary | ICD-10-CM

## 2016-03-14 MED ORDER — RAMELTEON 8 MG PO TABS: 8.0000 mg | ORAL_TABLET | Freq: Every day | ORAL | 1 refills | Status: DC

## 2016-03-21 ENCOUNTER — Other Ambulatory Visit: Payer: Self-pay | Admitting: Family Medicine

## 2016-03-29 ENCOUNTER — Other Ambulatory Visit: Payer: Self-pay | Admitting: Family Medicine

## 2016-03-29 ENCOUNTER — Other Ambulatory Visit: Payer: PPO

## 2016-03-29 DIAGNOSIS — D721 Eosinophilia, unspecified: Secondary | ICD-10-CM

## 2016-03-29 DIAGNOSIS — I10 Essential (primary) hypertension: Secondary | ICD-10-CM | POA: Diagnosis not present

## 2016-03-29 DIAGNOSIS — Z79899 Other long term (current) drug therapy: Secondary | ICD-10-CM

## 2016-03-29 DIAGNOSIS — I2510 Atherosclerotic heart disease of native coronary artery without angina pectoris: Secondary | ICD-10-CM

## 2016-03-29 DIAGNOSIS — Z0000 Encounter for general adult medical examination without abnormal findings: Secondary | ICD-10-CM

## 2016-03-29 DIAGNOSIS — Z125 Encounter for screening for malignant neoplasm of prostate: Secondary | ICD-10-CM

## 2016-03-29 DIAGNOSIS — E785 Hyperlipidemia, unspecified: Secondary | ICD-10-CM

## 2016-03-29 LAB — CBC WITH DIFFERENTIAL/PLATELET
Basophils Absolute: 0 cells/uL (ref 0–200)
Basophils Relative: 0 %
Eosinophils Absolute: 810 cells/uL — ABNORMAL HIGH (ref 15–500)
Eosinophils Relative: 15 %
HCT: 38.3 % — ABNORMAL LOW (ref 38.5–50.0)
Hemoglobin: 12.9 g/dL — ABNORMAL LOW (ref 13.0–17.0)
Lymphocytes Relative: 18 %
Lymphs Abs: 972 cells/uL (ref 850–3900)
MCH: 32.4 pg (ref 27.0–33.0)
MCHC: 33.7 g/dL (ref 32.0–36.0)
MCV: 96.2 fL (ref 80.0–100.0)
MPV: 9.1 fL (ref 7.5–12.5)
Monocytes Absolute: 378 cells/uL (ref 200–950)
Monocytes Relative: 7 %
Neutro Abs: 3240 cells/uL (ref 1500–7800)
Neutrophils Relative %: 60 %
Platelets: 207 10*3/uL (ref 140–400)
RBC: 3.98 MIL/uL — ABNORMAL LOW (ref 4.20–5.80)
RDW: 13.4 % (ref 11.0–15.0)
WBC: 5.4 10*3/uL (ref 3.8–10.8)

## 2016-03-29 LAB — COMPLETE METABOLIC PANEL WITH GFR
ALT: 29 U/L (ref 9–46)
AST: 38 U/L — ABNORMAL HIGH (ref 10–35)
Albumin: 4.1 g/dL (ref 3.6–5.1)
Alkaline Phosphatase: 141 U/L — ABNORMAL HIGH (ref 40–115)
BUN: 39 mg/dL — ABNORMAL HIGH (ref 7–25)
CO2: 26 mmol/L (ref 20–31)
Calcium: 9.6 mg/dL (ref 8.6–10.3)
Chloride: 106 mmol/L (ref 98–110)
Creat: 1.66 mg/dL — ABNORMAL HIGH (ref 0.70–1.11)
GFR, Est African American: 44 mL/min — ABNORMAL LOW (ref >=60)
GFR, Est Non African American: 38 mL/min — ABNORMAL LOW (ref >=60)
Glucose, Bld: 87 mg/dL (ref 70–99)
Potassium: 5.1 mmol/L (ref 3.5–5.3)
Sodium: 142 mmol/L (ref 135–146)
Total Bilirubin: 0.5 mg/dL (ref 0.2–1.2)
Total Protein: 6.4 g/dL (ref 6.1–8.1)

## 2016-03-29 LAB — LIPID PANEL
Cholesterol: 144 mg/dL (ref <200)
HDL: 55 mg/dL (ref >40)
LDL Cholesterol: 81 mg/dL (ref <100)
Total CHOL/HDL Ratio: 2.6 Ratio (ref <5.0)
Triglycerides: 41 mg/dL (ref <150)
VLDL: 8 mg/dL (ref <30)

## 2016-03-29 LAB — TSH: TSH: 1.84 mIU/L (ref 0.40–4.50)

## 2016-03-29 LAB — PSA: PSA: 0.9 ng/mL (ref <=4.0)

## 2016-04-02 ENCOUNTER — Encounter: Payer: Self-pay | Admitting: Family Medicine

## 2016-04-02 ENCOUNTER — Ambulatory Visit (INDEPENDENT_AMBULATORY_CARE_PROVIDER_SITE_OTHER): Payer: PPO | Admitting: Family Medicine

## 2016-04-02 VITALS — BP 140/64 | HR 58 | Temp 98.0°F | Resp 16 | Ht 67.0 in | Wt 206.0 lb

## 2016-04-02 DIAGNOSIS — R7989 Other specified abnormal findings of blood chemistry: Secondary | ICD-10-CM

## 2016-04-02 DIAGNOSIS — R945 Abnormal results of liver function studies: Secondary | ICD-10-CM

## 2016-04-02 DIAGNOSIS — E7800 Pure hypercholesterolemia, unspecified: Secondary | ICD-10-CM | POA: Diagnosis not present

## 2016-04-02 DIAGNOSIS — F5101 Primary insomnia: Secondary | ICD-10-CM | POA: Diagnosis not present

## 2016-04-02 DIAGNOSIS — Z0000 Encounter for general adult medical examination without abnormal findings: Secondary | ICD-10-CM | POA: Diagnosis not present

## 2016-04-02 DIAGNOSIS — I2510 Atherosclerotic heart disease of native coronary artery without angina pectoris: Secondary | ICD-10-CM

## 2016-04-02 DIAGNOSIS — N289 Disorder of kidney and ureter, unspecified: Secondary | ICD-10-CM | POA: Diagnosis not present

## 2016-04-02 DIAGNOSIS — I10 Essential (primary) hypertension: Secondary | ICD-10-CM | POA: Diagnosis not present

## 2016-04-02 MED ORDER — ZOLPIDEM TARTRATE 10 MG PO TABS: 10.0000 mg | ORAL_TABLET | Freq: Every evening | ORAL | 0 refills | Status: DC | PRN

## 2016-04-02 NOTE — Progress Notes (Signed)
Subjective:    Patient ID: [name], male    DOB: 1933-07-28, 83 y.o.   MRN: [id number]  HPI Patient is here today for complete physical exam. At his last office visit, I started the patient on rozeram.  This has not helped his insomnia. However he is no longer having the panic attacks occurring at night.  Colonoscopy is up-to-date. PSA is stable and excellent. Immunizations are up-to-date. However his most recent lab work is concerning as his creatinine has risen from 1.3-1.66. The patient has recently been taking meloxicam over the last month for pain in his right foot near the first MTP joint. Urgent care diagnosed him with arthritis although this raises concern for possible gout. He is also not drinking enough fluid. He's been taking more Tylenol. His most recent lab work also reveals elevations in his liver function test including a mild elevation in his AST as well as alkaline phosphatase. He denies any abdominal pain or discomfort  Appointment on 03/29/2016  Component Date Value Ref Range Status  . Sodium 03/29/2016 142  135 - 146 mmol/L Final  . Potassium 03/29/2016 5.1  3.5 - 5.3 mmol/L Final  . Chloride 03/29/2016 106  98 - 110 mmol/L Final  . CO2 03/29/2016 26  20 - 31 mmol/L Final  . Glucose, Bld 03/29/2016 87  70 - 99 mg/dL Final  . BUN 03/29/2016 39* 7 - 25 mg/dL Final  . Creat 03/29/2016 1.66* 0.70 - 1.11 mg/dL Final   Comment:   For patients > or = 81 years of age: The upper reference limit for Creatinine is approximately 13% higher for people identified as African-American.     . Total Bilirubin 03/29/2016 0.5  0.2 - 1.2 mg/dL Final  . Alkaline Phosphatase 03/29/2016 141* 40 - 115 U/L Final  . AST 03/29/2016 38* 10 - 35 U/L Final  . ALT 03/29/2016 29  9 - 46 U/L Final  . Total Protein 03/29/2016 6.4  6.1 - 8.1 g/dL Final  . Albumin 03/29/2016 4.1  3.6 - 5.1 g/dL Final  . Calcium 03/29/2016 9.6  8.6 - 10.3 mg/dL Final  . GFR, Est African American 03/29/2016  44* >=60 mL/min Final  . GFR, Est Non African American 03/29/2016 38* >=60 mL/min Final  . TSH 03/29/2016 1.84  0.40 - 4.50 mIU/L Final  . Cholesterol 03/29/2016 144  <200 mg/dL Final  . Triglycerides 03/29/2016 41  <150 mg/dL Final  . HDL 03/29/2016 55  >40 mg/dL Final  . Total CHOL/HDL Ratio 03/29/2016 2.6  <5.0 Ratio Final  . VLDL 03/29/2016 8  <30 mg/dL Final  . LDL Cholesterol 03/29/2016 81  <100 mg/dL Final  . WBC 03/29/2016 5.4  3.8 - 10.8 K/uL Final  . RBC 03/29/2016 3.98* 4.20 - 5.80 MIL/uL Final  . Hemoglobin 03/29/2016 12.9* 13.0 - 17.0 g/dL Final  . HCT 03/29/2016 38.3* 38.5 - 50.0 % Final  . MCV 03/29/2016 96.2  80.0 - 100.0 fL Final  . MCH 03/29/2016 32.4  27.0 - 33.0 pg Final  . MCHC 03/29/2016 33.7  32.0 - 36.0 g/dL Final  . RDW 03/29/2016 13.4  11.0 - 15.0 % Final  . Platelets 03/29/2016 207  140 - 400 K/uL Final  . MPV 03/29/2016 9.1  7.5 - 12.5 fL Final  . Neutro Abs 03/29/2016 3240  1,500 - 7,800 cells/uL Final  . Lymphs Abs 03/29/2016 972  850 - 3,900 cells/uL Final  . Monocytes Absolute 03/29/2016 378  200 - 950 cells/uL Final  .  Eosinophils Absolute 03/29/2016 810* 15 - 500 cells/uL Final  . Basophils Absolute 03/29/2016 0  0 - 200 cells/uL Final  . Neutrophils Relative % 03/29/2016 60  % Final  . Lymphocytes Relative 03/29/2016 18  % Final  . Monocytes Relative 03/29/2016 7  % Final  . Eosinophils Relative 03/29/2016 15  % Final  . Basophils Relative 03/29/2016 0  % Final  . Smear Review 03/29/2016 Criteria for review not met   Final  . PSA 03/29/2016 0.9  <=4.0 ng/mL Final   Comment:   The total PSA value from this assay system is standardized against the WHO standard. The test result will be approximately 20% lower when compared to the equimolar-standardized total PSA (Beck[employer] Coulter). Comparison of serial PSA results should be interpreted with this fact in mind.   This test was performed using the Siemens chemiluminescent method. Values obtained from  different assay methods cannot be used interchangeably. PSA levels, regardless of value, should not be interpreted as absolute evidence of the presence or absence of disease.       Immunization History  Administered Date(s) Administered  . Influenza Split 10/06/2012  . Influenza,inj,Quad PF,36+ Mos 11/12/2013, 11/11/2014, 10/20/2015  . Pneumococcal Conjugate-13 03/17/2013  . Pneumococcal Polysaccharide-23 02/05/2001  . Td 03/01/2009  . Zoster 02/25/2008    Past Medical History:  Diagnosis Date  . Allergy   . BPH (benign prostatic hypertrophy)   . CAD (coronary artery disease)    s/p cypher DES to pLAD 6/08; normal LVF;  ETT-Myoview 2009: no ischemia   . Eosinophilia   . HTN (hypertension)   . Hyperlipidemia   . Myocardial infarction    Past Surgical History:  Procedure Laterality Date  . APPENDECTOMY    . CARDIAC CATHETERIZATION  07/30/2006   CORONARY ANGIOPLASTY WITH STENT PLACEMENT  . EXPLORATORY LAPAROTOMY     age 51   Current Outpatient Prescriptions on File Prior to Visit  Medication Sig Dispense Refill  . ADVAIR DISKUS 250-50 MCG/DOSE AEPB INHALE 1 PUFF INTO THE LUNGS 2 (TWO) TIMES DAILY. 60 each 5  . amLODipine-benazepril (LOTREL) 5-20 MG capsule Take 1 capsule by mouth 2 (two) times daily. 180 capsule 3  . aspirin EC 81 MG tablet Take 1 tablet (81 mg total) by mouth daily.    Marland Kitchen atorvastatin (LIPITOR) 20 MG tablet TAKE 1 TABLET EVERY DAY 90 tablet 3  . Calcium Carbonate-Vitamin D (CALCARB 600/D) 600-400 MG-UNIT per tablet Take 1 tablet by mouth daily.     . Coenzyme Q-10 100 MG capsule Take 100 mg by mouth daily.    Marland Kitchen DHEA 50 MG CAPS Take 1 capsule by mouth daily with breakfast. T-Boost    . doxazosin (CARDURA) 2 MG tablet TAKE 1 TABLET (2 MG TOTAL) BY MOUTH DAILY. 30 tablet 5  . fluticasone (FLONASE) 50 MCG/ACT nasal spray PLACE 2 SPRAYS INTO BOTH NOSTRILS DAILY. 16 g 5  . ibuprofen (ADVIL,MOTRIN) 600 MG tablet Take 1 tablet (600 mg total) by mouth every 6 (six)  hours as needed for pain. 30 tablet 1  . Multiple Vitamin (MULTIVITAMIN) capsule Take 1 capsule by mouth daily. Resetigen-D  Cellular rejuvenation formula    . NON FORMULARY Take 1 tablet by mouth 2 (two) times daily. Omega Q 1 tab twice daily     . OVER THE COUNTER MEDICATION Take 1 tablet by mouth daily. Juvenon  Cellular health supplement    . ramelteon (ROZEREM) 8 MG tablet Take 1 tablet (8 mg total) by mouth at bedtime.  90 tablet 1  . VENTOLIN HFA 108 (90 Base) MCG/ACT inhaler INHALE 2 PUFFS INTO THE LUNGS EVERY 6 (SIX) HOURS AS NEEDED FOR WHEEZING OR SHORTNESS OF BREATH. 18 Inhaler 1   No current facility-administered medications on file prior to visit.    No Known Allergies Social History   Social History  . Marital status: Married    Spouse name: [name]  . Number of children: 1  . Years of education: N/A   Occupational History  . retired professor     History   . missionary    Social History Main Topics  . Smoking status: Former Research scientist (life sciences)  . Smokeless tobacco: Never Used     Comment: quit over 67yr ago.  . Alcohol use 2.4 - 3.6 oz/week    4 - 6 Glasses of wine per week  . Drug use: No  . Sexual activity: Yes    Partners: Female   Other Topics Concern  . Not on file   Social History Narrative   Lives with his wife (second marriage in 2015, first marriage ended when his wife died alzheimer's disease). Since his remarriage, his adult daughter doesn't speak with him.   Family History  Problem Relation Age of Onset  . Coronary artery disease Father 771 . Coronary artery disease Brother   . Aortic aneurysm Brother   . Colon cancer Brother   . Stomach cancer Mother   . Esophageal cancer Neg Hx   . Rectal cancer Neg Hx   . Arthritis Daughter     rheumatoid     Review of Systems  All other systems reviewed and are negative.      Objective:   Physical Exam  Constitutional: He is oriented to person, place, and time. He appears well-developed and well-nourished.  No distress.  HENT:  Head: Normocephalic and atraumatic.  Right Ear: External ear normal.  Left Ear: External ear normal.  Nose: Nose normal.  Mouth/Throat: Oropharynx is clear and moist. No oropharyngeal exudate.  Eyes: Conjunctivae and EOM are normal. Pupils are equal, round, and reactive to light. Right eye exhibits no discharge. Left eye exhibits no discharge. No scleral icterus.  Neck: Normal range of motion. Neck supple. No JVD present. No tracheal deviation present. No thyromegaly present.  Cardiovascular: Normal rate, regular rhythm, normal heart sounds and intact distal pulses.  Exam reveals no gallop and no friction rub.   No murmur heard. Pulmonary/Chest: Effort normal and breath sounds normal. No stridor. No respiratory distress. He has no wheezes. He has no rales. He exhibits no tenderness.  Abdominal: Soft. Bowel sounds are normal. He exhibits no distension and no mass. There is no tenderness. There is no rebound and no guarding.  Genitourinary: Penis normal. No penile tenderness.  Musculoskeletal: Normal range of motion. He exhibits no edema or tenderness.  Lymphadenopathy:    He has no cervical adenopathy.  Neurological: He is alert and oriented to person, place, and time. He has normal reflexes. No cranial nerve deficit. He exhibits normal muscle tone. Coordination normal.  Skin: Skin is warm. No rash noted. He is not diaphoretic. No erythema. No pallor.  Psychiatric: He has a normal mood and affect. His behavior is normal. Judgment and thought content normal.  Vitals reviewed.         Assessment & Plan:  Primary insomnia - Plan: zolpidem (AMBIEN) 10 MG tablet  Routine general medical examination at a health care facility  Pure hypercholesterolemia  ASCVD (arteriosclerotic cardiovascular disease)  Essential hypertension  Elevated LFTs  Acute renal insufficiency  Patient will discontinue meloxicam and try to drink 4 glasses of water a day. Recheck a CMP in 2  weeks to monitor his alkaline phosphatase, AST, and creatinine. I do think the patient has some mild chronic kidney disease recently made worse by dehydration and NSAID use. I am not sure as to the cause of his mild elevation in liver function test. We will recheck a CMP in 2 weeks. If they continue to remain elevated or worsen, I'll proceed with a right upper quadrant ultrasound. Immunizations are up-to-date as is his cancer screening. The remainder of his lab work is acceptable

## 2016-04-04 ENCOUNTER — Telehealth: Payer: Self-pay | Admitting: Family Medicine

## 2016-04-04 NOTE — Telephone Encounter (Signed)
Submitted PA through [email or web address] and received the following:  [facility] has received your information, and the request will be reviewed. You may close this dialog, return to your dashboard, and perform other tasks. You will receive an electronic determination in CoverMy[employer]eds. You can see the latest determination by locating this request on your dashboard or by reopening this request. You will also receive a faxed copy of the determination. If you have any questions please contact [facility] at [phone]. If you need assistance, please chat with [employer]eds or call us at [phone].

## 2016-04-05 NOTE — Telephone Encounter (Signed)
Pharmacy aware via vm

## 2016-04-05 NOTE — Telephone Encounter (Signed)
Approvedtoday  PA Case: [id number], Status: Approved, Coverage Starts on: 04/04/2016 12:00:00 AM, Coverage Ends on: 02/04/2017 12:00:00 AM.

## 2016-04-13 ENCOUNTER — Other Ambulatory Visit: Payer: Self-pay | Admitting: Family Medicine

## 2016-04-13 DIAGNOSIS — R945 Abnormal results of liver function studies: Principal | ICD-10-CM

## 2016-04-13 DIAGNOSIS — R7989 Other specified abnormal findings of blood chemistry: Secondary | ICD-10-CM

## 2016-04-16 ENCOUNTER — Other Ambulatory Visit: Payer: PPO

## 2016-04-16 ENCOUNTER — Other Ambulatory Visit: Payer: Self-pay | Admitting: Family Medicine

## 2016-04-16 DIAGNOSIS — R7989 Other specified abnormal findings of blood chemistry: Secondary | ICD-10-CM | POA: Diagnosis not present

## 2016-04-16 DIAGNOSIS — R945 Abnormal results of liver function studies: Principal | ICD-10-CM

## 2016-04-16 LAB — COMPLETE METABOLIC PANEL WITH GFR
ALT: 27 U/L (ref 9–46)
AST: 34 U/L (ref 10–35)
Albumin: 4.2 g/dL (ref 3.6–5.1)
Alkaline Phosphatase: 140 U/L — ABNORMAL HIGH (ref 40–115)
BUN: 24 mg/dL (ref 7–25)
CO2: 26 mmol/L (ref 20–31)
Calcium: 9 mg/dL (ref 8.6–10.3)
Chloride: 105 mmol/L (ref 98–110)
Creat: 1.6 mg/dL — ABNORMAL HIGH (ref 0.70–1.11)
GFR, Est African American: 46 mL/min — ABNORMAL LOW (ref >=60)
GFR, Est Non African American: 40 mL/min — ABNORMAL LOW (ref >=60)
Glucose, Bld: 92 mg/dL (ref 70–99)
Potassium: 4.7 mmol/L (ref 3.5–5.3)
Sodium: 140 mmol/L (ref 135–146)
Total Bilirubin: 0.5 mg/dL (ref 0.2–1.2)
Total Protein: 6.6 g/dL (ref 6.1–8.1)

## 2016-04-18 LAB — GAMMA GT: GGT: 140 U/L — ABNORMAL HIGH (ref 7–51)

## 2016-04-19 ENCOUNTER — Telehealth: Payer: Self-pay | Admitting: *Deleted

## 2016-04-19 ENCOUNTER — Other Ambulatory Visit: Payer: Self-pay | Admitting: *Deleted

## 2016-04-19 DIAGNOSIS — R748 Abnormal levels of other serum enzymes: Secondary | ICD-10-CM

## 2016-04-19 NOTE — Telephone Encounter (Signed)
Received return call from patient.   Reports that he has stopped taking IBu and Mobic as discussed with MD d/t renal impairment.   MD please advise.

## 2016-04-19 NOTE — Telephone Encounter (Signed)
Received VM from patient.   Reports that pain in R foot reported at CPE has now been noted in B knees and shoulders.   Call placed to patient for more information. [place].

## 2016-04-20 ENCOUNTER — Telehealth: Payer: Self-pay | Admitting: Family Medicine

## 2016-04-20 NOTE — Telephone Encounter (Signed)
Patient returned call and made aware.

## 2016-04-20 NOTE — Telephone Encounter (Signed)
At the present time, I would take tylenol 1000 mg in the AM and 1000 mg in the PM while awaiting ruq [place].

## 2016-04-20 NOTE — Telephone Encounter (Signed)
Call placed to patient. LMTRC.  

## 2016-04-20 NOTE — Telephone Encounter (Signed)
Pt said he spoke with christina yesterday and wanted to talk back with her regarding his labs. But I am routing it to both of you in case one can get to it quicker than the other.

## 2016-04-20 NOTE — Telephone Encounter (Signed)
Patient returned call.   Discussed results with patient.

## 2016-05-01 ENCOUNTER — Encounter: Payer: Self-pay | Admitting: Family Medicine

## 2016-05-01 ENCOUNTER — Ambulatory Visit
Admission: RE | Admit: 2016-05-01 | Discharge: 2016-05-01 | Disposition: A | Discharge status: Home / Self Care | Payer: PPO | Source: Ambulatory Visit | Attending: Family Medicine | Admitting: Family Medicine

## 2016-05-01 DIAGNOSIS — R748 Abnormal levels of other serum enzymes: Secondary | ICD-10-CM

## 2016-05-01 DIAGNOSIS — R7989 Other specified abnormal findings of blood chemistry: Secondary | ICD-10-CM | POA: Diagnosis not present

## 2016-05-03 ENCOUNTER — Telehealth: Payer: Self-pay | Admitting: Family Medicine

## 2016-05-03 NOTE — Telephone Encounter (Signed)
Arthritis pain no better and was told to call back for pain medication

## 2016-05-07 MED ORDER — TRAMADOL HCL 50 MG PO TABS: 50.0000 mg | ORAL_TABLET | Freq: Four times a day (QID) | ORAL | 0 refills | Status: DC | PRN

## 2016-05-07 NOTE — Telephone Encounter (Signed)
Recommend tramadol 50 mg po q 6 hrs prn (60)

## 2016-05-07 NOTE — Telephone Encounter (Signed)
Medication called/sent to pharm and pt aware via vm

## 2016-06-18 ENCOUNTER — Encounter: Payer: Self-pay | Admitting: Family Medicine

## 2016-06-18 ENCOUNTER — Ambulatory Visit (INDEPENDENT_AMBULATORY_CARE_PROVIDER_SITE_OTHER): Payer: PPO | Admitting: Family Medicine

## 2016-06-18 VITALS — BP 154/78 | HR 60 | Temp 98.2°F | Resp 16 | Ht 67.0 in | Wt 194.0 lb

## 2016-06-18 DIAGNOSIS — I952 Hypotension due to drugs: Secondary | ICD-10-CM

## 2016-06-18 LAB — CBC WITH DIFFERENTIAL/PLATELET
Basophils Absolute: 0 cells/uL (ref 0–200)
Basophils Relative: 0 %
Eosinophils Absolute: 585 cells/uL — ABNORMAL HIGH (ref 15–500)
Eosinophils Relative: 9 %
HCT: 39.5 % (ref 38.5–50.0)
Hemoglobin: 13.6 g/dL (ref 13.0–17.0)
Lymphocytes Relative: 18 %
Lymphs Abs: 1170 cells/uL (ref 850–3900)
MCH: 32.5 pg (ref 27.0–33.0)
MCHC: 34.4 g/dL (ref 32.0–36.0)
MCV: 94.3 fL (ref 80.0–100.0)
MPV: 9 fL (ref 7.5–12.5)
Monocytes Absolute: 520 cells/uL (ref 200–950)
Monocytes Relative: 8 %
Neutro Abs: 4225 cells/uL (ref 1500–7800)
Neutrophils Relative %: 65 %
Platelets: 228 10*3/uL (ref 140–400)
RBC: 4.19 MIL/uL — ABNORMAL LOW (ref 4.20–5.80)
RDW: 13.1 % (ref 11.0–15.0)
WBC: 6.5 10*3/uL (ref 3.8–10.8)

## 2016-06-18 NOTE — Progress Notes (Addendum)
Subjective:    Patient ID: [name], male    DOB: [birth year], 83 y.o.   MRN: [id number]  HPI Patient is a very pleasant 83 year old Caucasian male who presents today with a history of low blood pressure. Previously he was taking amlodipine/benazepril twice daily for accumulative dose of 10/40. He was also taking doxazosin 2 mg a day both for hypertension as well as BPH. Recently, the patient underwent a fast for spiritual reasons. He has lost approximately 23 pounds. As a result his blood pressure started dropping. Over the weekend, his systolic blood pressures were in the 90s. On 2 separate instances, the patient developed presyncopal feelings. This morning he discontinue amlodipine/benazepril. He denies any signs or symptoms of an infection. He denies any blood loss. On a side note, the patient has discontinued all anti-inflammatory medication because of his chronic kidney disease. He is also refrain from Tylenol. His last use of visits, the patient's creatinine has been elevated at 1.6 with a mild elevation in his alkaline phosphatase. Therefore we had the patient discontinue Tylenol and NSAIDs. He is currently taking tramadol 1-2 times a day at most and this is controlling his arthritic pain Past Medical History:  Diagnosis Date  . Allergy   . BPH (benign prostatic hypertrophy)   . CAD (coronary artery disease)    s/p cypher DES to pLAD 6/08; normal LVF;  ETT-Myoview 2009: no ischemia   . Eosinophilia   . HTN (hypertension)   . Hyperlipidemia   . Myocardial infarction [facility])    Past Surgical History:  Procedure Laterality Date  . APPENDECTOMY    . CARDIAC CATHETERIZATION  07/30/2006   CORONARY ANGIOPLASTY WITH STENT PLACEMENT  . EXPLORATORY LAPAROTOMY     age 83   Current Outpatient Prescriptions on File Prior to Visit  Medication Sig Dispense Refill  . ADVAIR DISKUS 250-50 MCG/DOSE AEPB INHALE 1 PUFF INTO THE LUNGS 2 (TWO) TIMES DAILY. 60 each 5  . aspirin EC 81 MG tablet  Take 1 tablet (81 mg total) by mouth daily.    Marland Kitchen atorvastatin (LIPITOR) 20 MG tablet TAKE 1 TABLET EVERY DAY 90 tablet 3  . Calcium Carbonate-Vitamin D (CALCARB 600/D) 600-400 MG-UNIT per tablet Take 1 tablet by mouth daily.     . Coenzyme Q-10 100 MG capsule Take 100 mg by mouth daily.    Marland Kitchen DHEA 50 MG CAPS Take 1 capsule by mouth daily with breakfast. T-Boost    . doxazosin (CARDURA) 2 MG tablet TAKE 1 TABLET (2 MG TOTAL) BY MOUTH DAILY. 30 tablet 5  . fluticasone (FLONASE) 50 MCG/ACT nasal spray PLACE 2 SPRAYS INTO BOTH NOSTRILS DAILY. 16 g 5  . Multiple Vitamin (MULTIVITAMIN) capsule Take 1 capsule by mouth daily. Resetigen-D  Cellular rejuvenation formula    . NON FORMULARY Take 1 tablet by mouth 2 (two) times daily. Omega Q 1 tab twice daily     . OVER THE COUNTER MEDICATION Take 1 tablet by mouth daily. Juvenon  Cellular health supplement    . ramelteon (ROZEREM) 8 MG tablet Take 1 tablet (8 mg total) by mouth at bedtime. 90 tablet 1  . traMADol (ULTRAM) 50 MG tablet Take 1 tablet (50 mg total) by mouth every 6 (six) hours as needed. 60 tablet 0  . VENTOLIN HFA 108 (90 Base) MCG/ACT inhaler INHALE 2 PUFFS INTO THE LUNGS EVERY 6 (SIX) HOURS AS NEEDED FOR WHEEZING OR SHORTNESS OF BREATH. 18 Inhaler 1  . amLODipine-benazepril (LOTREL) 5-20 MG capsule Take 1  capsule by mouth 2 (two) times daily. (Patient not taking: Reported on 06/18/2016) 180 capsule 3  . ibuprofen (ADVIL,MOTRIN) 600 MG tablet Take 1 tablet (600 mg total) by mouth every 6 (six) hours as needed for pain. (Patient not taking: Reported on 06/18/2016) 30 tablet 1  . meloxicam (MOBIC) 15 MG tablet Take 15 mg by mouth daily.    Marland Kitchen zolpidem (AMBIEN) 10 MG tablet Take 1 tablet (10 mg total) by mouth at bedtime as needed for sleep. 30 tablet 0   No current facility-administered medications on file prior to visit.    No Known Allergies Social History   Social History  . Marital status: Married    Spouse name: [name]  . Number of  children: 1  . Years of education: N/A   Occupational History  . retired professor     History   . missionary    Social History Main Topics  . Smoking status: Former Research scientist (life sciences)  . Smokeless tobacco: Never Used     Comment: quit over 53yr ago.  . Alcohol use 2.4 - 3.6 oz/week    4 - 6 Glasses of wine per week  . Drug use: No  . Sexual activity: Yes    Partners: Female   Other Topics Concern  . Not on file   Social History Narrative   Lives with his wife (second marriage in 2015, first marriage ended when his wife died alzheimer's disease). Since his remarriage, his adult daughter doesn't speak with him.      Review of Systems  All other systems reviewed and are negative.      Objective:   Physical Exam  Constitutional: He appears well-developed and well-nourished. No distress.  Neck: Neck supple. No thyromegaly present.  Cardiovascular: Normal rate, regular rhythm and normal heart sounds.   Pulmonary/Chest: Effort normal and breath sounds normal. No respiratory distress. He has no wheezes. He has no rales.  Abdominal: Soft. Bowel sounds are normal. He exhibits no distension. There is no tenderness. There is no rebound.  Musculoskeletal: He exhibits no edema.  Lymphadenopathy:    He has no cervical adenopathy.  Skin: He is not diaphoretic.  Vitals reviewed.         Assessment & Plan:  Hypotension due to drugs - Plan: CBC with Differential/Platelet, COMPLETE METABOLIC PANEL WITH GFR  Temporarily discontinue amlodipine and benazepril because the patient does not believe he can discontinue Cardura due to his history of BPH. He will resume his previous diet. He will check his blood pressure everyday and when his blood pressure will allow it, we will resume an ACE inhibitor for renal protection  Labs continue to show mild elevation in alk phos for several months with UKoreashowing fatty liver disease and GGT elevated as well.  Renal fcn is better and suggests dehydration as  cause for temporary worsening of Cr (1.8) recorded at this OV.   Therefore, I instructed my nurse to tell him:  "Renal fcn is better as expected and was worse due to dehydration.  Alkaline phosphatase remains elevated MILDLY.  Looks like fatty liver disease and UKoreasuggests that as well.  The onlyother disease would be hep C and I have not checked him for viral hepatitis.  If he would like or believes he is at risk (blood transfusion prior to 1980 for example), we could check a viral hepatitis panel.  "

## 2016-06-19 ENCOUNTER — Encounter: Payer: Self-pay | Admitting: Family Medicine

## 2016-06-19 LAB — COMPLETE METABOLIC PANEL WITH GFR
ALT: 28 U/L (ref 9–46)
AST: 39 U/L — ABNORMAL HIGH (ref 10–35)
Albumin: 4.4 g/dL (ref 3.6–5.1)
Alkaline Phosphatase: 143 U/L — ABNORMAL HIGH (ref 40–115)
BUN: 28 mg/dL — ABNORMAL HIGH (ref 7–25)
CO2: 22 mmol/L (ref 20–31)
Calcium: 9.4 mg/dL (ref 8.6–10.3)
Chloride: 106 mmol/L (ref 98–110)
Creat: 1.8 mg/dL — ABNORMAL HIGH (ref 0.70–1.11)
GFR, Est African American: 40 mL/min — ABNORMAL LOW (ref >=60)
GFR, Est Non African American: 35 mL/min — ABNORMAL LOW (ref >=60)
Glucose, Bld: 108 mg/dL — ABNORMAL HIGH (ref 70–99)
Potassium: 5.1 mmol/L (ref 3.5–5.3)
Sodium: 139 mmol/L (ref 135–146)
Total Bilirubin: 0.6 mg/dL (ref 0.2–1.2)
Total Protein: 6.9 g/dL (ref 6.1–8.1)

## 2016-06-25 ENCOUNTER — Other Ambulatory Visit: Payer: PPO

## 2016-06-25 DIAGNOSIS — I10 Essential (primary) hypertension: Secondary | ICD-10-CM

## 2016-06-25 DIAGNOSIS — Z79899 Other long term (current) drug therapy: Secondary | ICD-10-CM

## 2016-06-25 DIAGNOSIS — R944 Abnormal results of kidney function studies: Secondary | ICD-10-CM

## 2016-06-26 LAB — COMPLETE METABOLIC PANEL WITH GFR
ALT: 31 U/L (ref 9–46)
AST: 36 U/L — ABNORMAL HIGH (ref 10–35)
Albumin: 3.9 g/dL (ref 3.6–5.1)
Alkaline Phosphatase: 145 U/L — ABNORMAL HIGH (ref 40–115)
BUN: 19 mg/dL (ref 7–25)
CO2: 24 mmol/L (ref 20–31)
Calcium: 9.1 mg/dL (ref 8.6–10.3)
Chloride: 109 mmol/L (ref 98–110)
Creat: 1.4 mg/dL — ABNORMAL HIGH (ref 0.70–1.11)
GFR, Est African American: 54 mL/min — ABNORMAL LOW (ref >=60)
GFR, Est Non African American: 47 mL/min — ABNORMAL LOW (ref >=60)
Glucose, Bld: 91 mg/dL (ref 70–99)
Potassium: 4.5 mmol/L (ref 3.5–5.3)
Sodium: 142 mmol/L (ref 135–146)
Total Bilirubin: 0.5 mg/dL (ref 0.2–1.2)
Total Protein: 6.2 g/dL (ref 6.1–8.1)

## 2016-06-28 ENCOUNTER — Other Ambulatory Visit: Payer: Self-pay | Admitting: Cardiovascular Disease

## 2016-06-28 DIAGNOSIS — I10 Essential (primary) hypertension: Secondary | ICD-10-CM

## 2016-06-28 DIAGNOSIS — I2510 Atherosclerotic heart disease of native coronary artery without angina pectoris: Secondary | ICD-10-CM

## 2016-06-29 ENCOUNTER — Encounter: Payer: Self-pay | Admitting: Family Medicine

## 2016-07-30 ENCOUNTER — Ambulatory Visit: Payer: Self-pay | Admitting: Family Medicine

## 2016-08-27 ENCOUNTER — Ambulatory Visit (INDEPENDENT_AMBULATORY_CARE_PROVIDER_SITE_OTHER): Payer: PPO | Admitting: Family Medicine

## 2016-08-27 ENCOUNTER — Encounter: Payer: Self-pay | Admitting: Family Medicine

## 2016-08-27 VITALS — BP 142/60 | HR 66 | Temp 97.9°F | Resp 16 | Ht 67.0 in | Wt 195.0 lb

## 2016-08-27 DIAGNOSIS — L089 Local infection of the skin and subcutaneous tissue, unspecified: Secondary | ICD-10-CM

## 2016-08-27 DIAGNOSIS — L723 Sebaceous cyst: Secondary | ICD-10-CM | POA: Diagnosis not present

## 2016-08-27 DIAGNOSIS — I089 Rheumatic multiple valve disease, unspecified: Secondary | ICD-10-CM | POA: Diagnosis not present

## 2016-08-27 MED ORDER — SULFAMETHOXAZOLE-TRIMETHOPRIM 800-160 MG PO TABS: 1.0000 | ORAL_TABLET | Freq: Two times a day (BID) | ORAL | 0 refills | Status: DC

## 2016-08-27 NOTE — Progress Notes (Signed)
Subjective:    Patient ID: [name], male    DOB: July 29, 1933, 83 y.o.   MRN: [id number]  HPI  Patient has a large cyst on the posterior aspect of his neck that is erythematous swollen indurated and draining sebaceous contents from a central pore. It appears to be secondarily infected. He is here today for treatment. Lesion is approximately 3 cm in diameter Past Medical History:  Diagnosis Date  . Allergy   . BPH (benign prostatic hypertrophy)   . CAD (coronary artery disease)    s/p cypher DES to pLAD 6/08; normal LVF;  ETT-Myoview 2009: no ischemia   . Eosinophilia   . HTN (hypertension)   . Hyperlipidemia   . Myocardial infarction [facility])    Past Surgical History:  Procedure Laterality Date  . APPENDECTOMY    . CARDIAC CATHETERIZATION  07/30/2006   CORONARY ANGIOPLASTY WITH STENT PLACEMENT  . EXPLORATORY LAPAROTOMY     age 29   Current Outpatient Prescriptions on File Prior to Visit  Medication Sig Dispense Refill  . ADVAIR DISKUS 250-50 MCG/DOSE AEPB INHALE 1 PUFF INTO THE LUNGS 2 (TWO) TIMES DAILY. 60 each 5  . amLODipine-benazepril (LOTREL) 5-20 MG capsule TAKE 1 CAPSULE BY MOUTH 2 (TWO) TIMES DAILY. 180 capsule 1  . aspirin EC 81 MG tablet Take 1 tablet (81 mg total) by mouth daily.    Marland Kitchen atorvastatin (LIPITOR) 20 MG tablet TAKE 1 TABLET EVERY DAY 90 tablet 3  . Calcium Carbonate-Vitamin D (CALCARB 600/D) 600-400 MG-UNIT per tablet Take 1 tablet by mouth daily.     . Coenzyme Q-10 100 MG capsule Take 100 mg by mouth daily.    Marland Kitchen DHEA 50 MG CAPS Take 1 capsule by mouth daily with breakfast. T-Boost    . doxazosin (CARDURA) 2 MG tablet TAKE 1 TABLET (2 MG TOTAL) BY MOUTH DAILY. 30 tablet 5  . fluticasone (FLONASE) 50 MCG/ACT nasal spray PLACE 2 SPRAYS INTO BOTH NOSTRILS DAILY. 16 g 5  . ibuprofen (ADVIL,MOTRIN) 600 MG tablet Take 1 tablet (600 mg total) by mouth every 6 (six) hours as needed for pain. 30 tablet 1  . Multiple Vitamin (MULTIVITAMIN) capsule Take 1  capsule by mouth daily. Resetigen-D  Cellular rejuvenation formula    . NON FORMULARY Take 1 tablet by mouth 2 (two) times daily. Omega Q 1 tab twice daily     . OVER THE COUNTER MEDICATION Take 1 tablet by mouth daily. Juvenon  Cellular health supplement    . ramelteon (ROZEREM) 8 MG tablet Take 1 tablet (8 mg total) by mouth at bedtime. 90 tablet 1  . traMADol (ULTRAM) 50 MG tablet Take 1 tablet (50 mg total) by mouth every 6 (six) hours as needed. 60 tablet 0  . VENTOLIN HFA 108 (90 Base) MCG/ACT inhaler INHALE 2 PUFFS INTO THE LUNGS EVERY 6 (SIX) HOURS AS NEEDED FOR WHEEZING OR SHORTNESS OF BREATH. 18 Inhaler 1  . meloxicam (MOBIC) 15 MG tablet Take 15 mg by mouth daily.    Marland Kitchen zolpidem (AMBIEN) 10 MG tablet Take 1 tablet (10 mg total) by mouth at bedtime as needed for sleep. 30 tablet 0   No current facility-administered medications on file prior to visit.    No Known Allergies Social History   Social History  . Marital status: Married    Spouse name: [name]  . Number of children: 1  . Years of education: N/A   Occupational History  . retired professor     History   .  missionary    Social History Main Topics  . Smoking status: Former Research scientist (life sciences)  . Smokeless tobacco: Never Used     Comment: quit over 10yrs ago.  . Alcohol use 2.4 - 3.6 oz/week    4 - 6 Glasses of wine per week  . Drug use: No  . Sexual activity: Yes    Partners: Female   Other Topics Concern  . Not on file   Social History Narrative   Lives with his wife (second marriage in 2015, first marriage ended when his wife died alzheimer's disease). Since his remarriage, his adult daughter doesn't speak with him.     Review of Systems  All other systems reviewed and are negative.      Objective:   Physical Exam  Cardiovascular: Normal rate, regular rhythm and normal heart sounds.   Pulmonary/Chest: Effort normal and breath sounds normal.  Musculoskeletal:       Back:  Skin: Skin is warm. There is erythema.   Vitals reviewed.   3 cm infected inflamed sebaceous cyst in the area demarcated on the drawing      Assessment & Plan:  Infected sebaceous cyst - Plan: sulfamethoxazole-trimethoprim (BACTRIM DS,SEPTRA DS) 800-160 MG tablet  Area was anesthetized 0.1% lidocaine with epinephrine. The posterior aspect of his neck was then prepped and draped in sterile fashion. A 3.5 x 3 cm elliptical excision was performed around the lesion down to the subcutaneous fascia. The lesion was removed in its entirety. The area was then irrigated with saline and cleansed with Betadine. Skin edges were then approximated using 5 simple interrupted 3-0 Ethilon sutures. Given the fact that area was infected prior to surgery, I will treat the patient with Bactrim double strength tablets 1 by mouth twice a day for 7 days. Stitches out in 7-10 days

## 2016-08-28 ENCOUNTER — Telehealth: Payer: Self-pay | Admitting: Family Medicine

## 2016-08-28 NOTE — Telephone Encounter (Signed)
Patient is calling to say her threw up this morning. Wants to know if it could be from the antibiotic  [phone]

## 2016-08-28 NOTE — Telephone Encounter (Signed)
Per WTP Stop antibx and see if N&V improve. Per pt he started a new otc and thinks it may be that as well but is not sure. Informed pt to try and take the antibx with a full meal and if it still made him sick to stop it. Pt verbalized understanding.

## 2016-09-04 ENCOUNTER — Encounter: Payer: Self-pay | Admitting: Family Medicine

## 2016-09-04 ENCOUNTER — Ambulatory Visit (INDEPENDENT_AMBULATORY_CARE_PROVIDER_SITE_OTHER): Payer: PPO | Admitting: Family Medicine

## 2016-09-04 VITALS — BP 120/58 | HR 60 | Temp 97.8°F | Resp 16 | Ht 67.0 in | Wt 195.0 lb

## 2016-09-04 DIAGNOSIS — Z4802 Encounter for removal of sutures: Secondary | ICD-10-CM

## 2016-09-04 NOTE — Progress Notes (Signed)
Subjective:    Patient ID: [name], male    DOB: 1934-01-07, 83 y.o.   MRN: [id number]  HPI 08/27/16 Patient has a large cyst on the posterior aspect of his neck that is erythematous swollen indurated and draining sebaceous contents from a central pore. It appears to be secondarily infected. He is here today for treatment. Lesion is approximately 3 cm in diameter.  At that time, my plan was: Area was anesthetized 0.1% lidocaine with epinephrine. The posterior aspect of his neck was then prepped and draped in sterile fashion. A 3.5 x 3 cm elliptical excision was performed around the lesion down to the subcutaneous fascia. The lesion was removed in its entirety. The area was then irrigated with saline and cleansed with Betadine. Skin edges were then approximated using 5 simple interrupted 3-0 Ethilon sutures. Given the fact that area was infected prior to surgery, I will treat the patient with Bactrim double strength tablets 1 by mouth twice a day for 7 days. Stitches out in 7-10 days  09/04/16 Wound is healed nicely. Patient is here today for suture removal. There've been no complications. There is no erythema fluctuance or evidence of infection around the surgical site Past Medical History:  Diagnosis Date  . Allergy   . BPH (benign prostatic hypertrophy)   . CAD (coronary artery disease)    s/p cypher DES to pLAD 6/08; normal LVF;  ETT-Myoview 2009: no ischemia   . Eosinophilia   . HTN (hypertension)   . Hyperlipidemia   . Myocardial infarction [facility])    Past Surgical History:  Procedure Laterality Date  . APPENDECTOMY    . CARDIAC CATHETERIZATION  07/30/2006   CORONARY ANGIOPLASTY WITH STENT PLACEMENT  . EXPLORATORY LAPAROTOMY     age 80   Current Outpatient Prescriptions on File Prior to Visit  Medication Sig Dispense Refill  . ADVAIR DISKUS 250-50 MCG/DOSE AEPB INHALE 1 PUFF INTO THE LUNGS 2 (TWO) TIMES DAILY. 60 each 5  . amLODipine-benazepril (LOTREL) 5-20 MG capsule  TAKE 1 CAPSULE BY MOUTH 2 (TWO) TIMES DAILY. 180 capsule 1  . aspirin EC 81 MG tablet Take 1 tablet (81 mg total) by mouth daily.    Marland Kitchen atorvastatin (LIPITOR) 20 MG tablet TAKE 1 TABLET EVERY DAY 90 tablet 3  . Calcium Carbonate-Vitamin D (CALCARB 600/D) 600-400 MG-UNIT per tablet Take 1 tablet by mouth daily.     . Coenzyme Q-10 100 MG capsule Take 100 mg by mouth daily.    Marland Kitchen DHEA 50 MG CAPS Take 1 capsule by mouth daily with breakfast. T-Boost    . doxazosin (CARDURA) 2 MG tablet TAKE 1 TABLET (2 MG TOTAL) BY MOUTH DAILY. 30 tablet 5  . fluticasone (FLONASE) 50 MCG/ACT nasal spray PLACE 2 SPRAYS INTO BOTH NOSTRILS DAILY. 16 g 5  . ibuprofen (ADVIL,MOTRIN) 600 MG tablet Take 1 tablet (600 mg total) by mouth every 6 (six) hours as needed for pain. 30 tablet 1  . meloxicam (MOBIC) 15 MG tablet Take 15 mg by mouth daily.    . Multiple Vitamin (MULTIVITAMIN) capsule Take 1 capsule by mouth daily. Resetigen-D  Cellular rejuvenation formula    . NON FORMULARY Take 1 tablet by mouth 2 (two) times daily. Omega Q 1 tab twice daily     . OVER THE COUNTER MEDICATION Take 1 tablet by mouth daily. Juvenon  Cellular health supplement    . ramelteon (ROZEREM) 8 MG tablet Take 1 tablet (8 mg total) by mouth at bedtime. [place]  tablet 1  . traMADol (ULTRAM) 50 MG tablet Take 1 tablet (50 mg total) by mouth every 6 (six) hours as needed. 60 tablet 0  . VENTOLIN HFA 108 (90 Base) MCG/ACT inhaler INHALE 2 PUFFS INTO THE LUNGS EVERY 6 (SIX) HOURS AS NEEDED FOR WHEEZING OR SHORTNESS OF BREATH. 18 Inhaler 1  . zolpidem (AMBIEN) 10 MG tablet Take 1 tablet (10 mg total) by mouth at bedtime as needed for sleep. 30 tablet 0   No current facility-administered medications on file prior to visit.    No Known Allergies Social History   Social History  . Marital status: Married    Spouse name: [name]  . Number of children: 1  . Years of education: N/A   Occupational History  . retired professor     History   . missionary     Social History Main Topics  . Smoking status: Former Research scientist (life sciences)  . Smokeless tobacco: Never Used     Comment: quit over 59yrs ago.  . Alcohol use 2.4 - 3.6 oz/week    4 - 6 Glasses of wine per week  . Drug use: No  . Sexual activity: Yes    Partners: Female   Other Topics Concern  . Not on file   Social History Narrative   Lives with his wife (second marriage in 2015, first marriage ended when his wife died alzheimer's disease). Since his remarriage, his adult daughter doesn't speak with him.     Review of Systems  All other systems reviewed and are negative.      Objective:   Physical Exam  Cardiovascular: Normal rate, regular rhythm and normal heart sounds.   Pulmonary/Chest: Effort normal and breath sounds normal.  Musculoskeletal:       Back:  Skin: Skin is warm. No erythema.  Vitals reviewed.         Assessment & Plan:  Visit for suture removal Wound is well-healed. All sutures are removed without difficulty. Wound was dressed with Steri-Strips for reinforcement. He can remove Steri-Strips after a week.

## 2016-09-06 ENCOUNTER — Other Ambulatory Visit: Payer: Self-pay | Admitting: Family Medicine

## 2016-10-07 IMAGING — US US RENAL
1 series · 14 of 25 positions shown · non-contrast
Comparison: [DATE] CT.

CLINICAL DATA: 83-year-old male with abnormal renal function test.
Initial encounter.

EXAM:
RENAL / URINARY TRACT ULTRASOUND COMPLETE

[Series 1: us renal · 0.23mm/px · 14 of 36 slices shown]
[im 1/36]
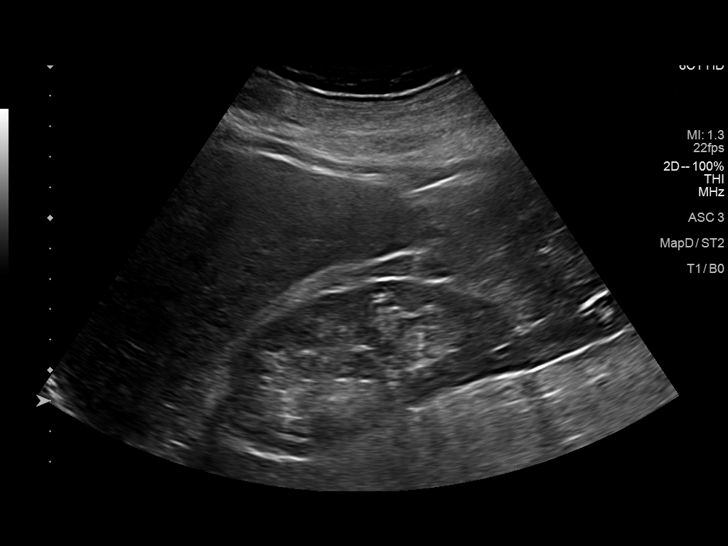
[im 3/36]
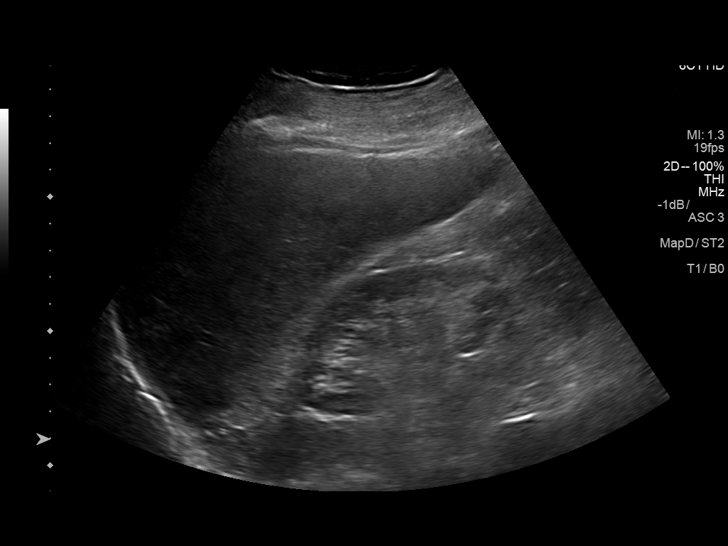
[im 6/36]
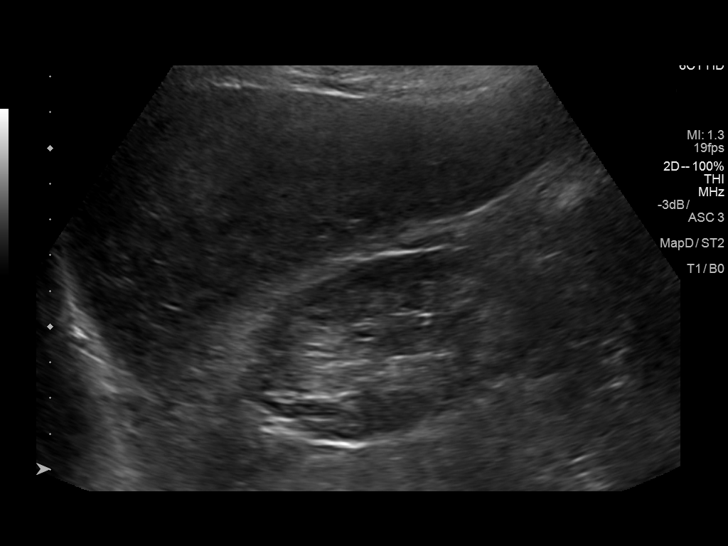
[im 9/36]
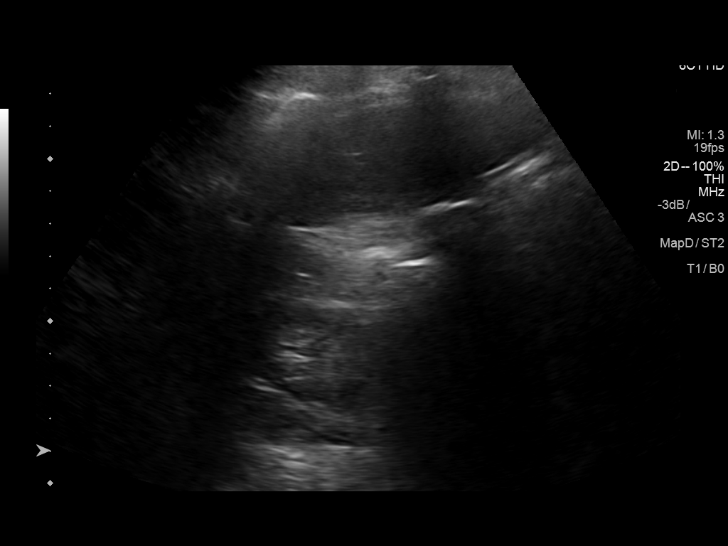
[im 12/36]
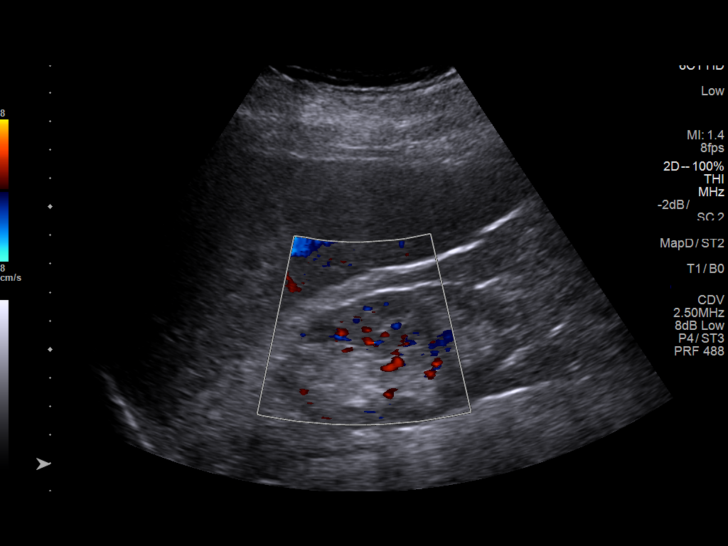
[im 14/36]
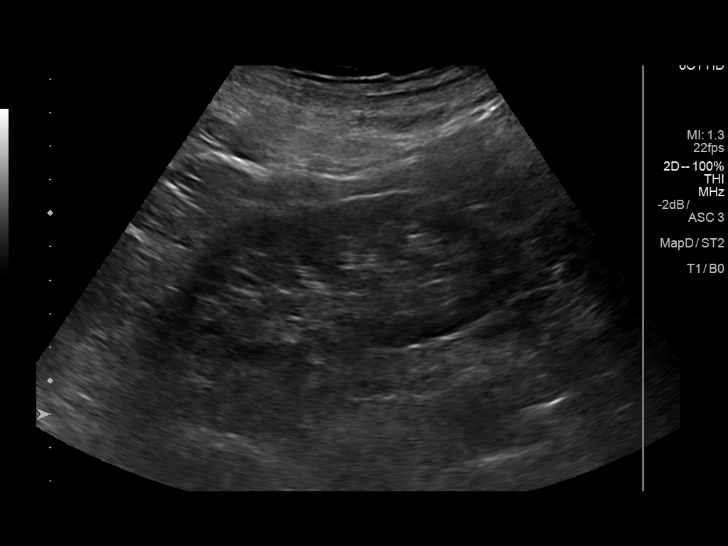
[im 17/36]
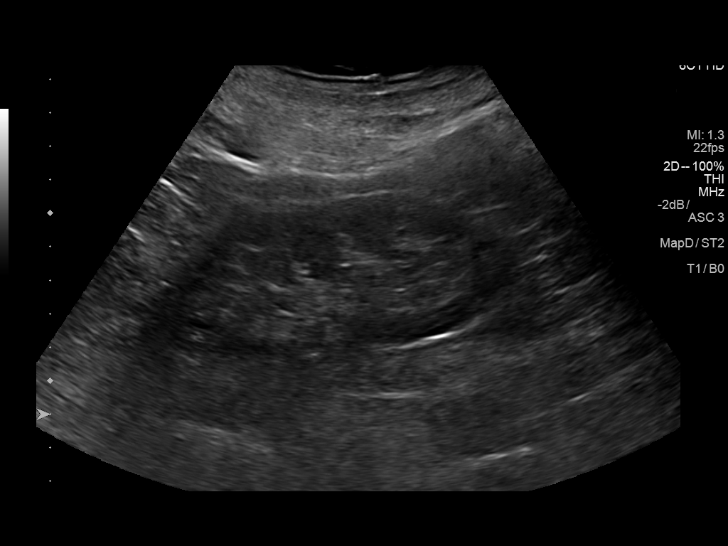
[im 19/36]
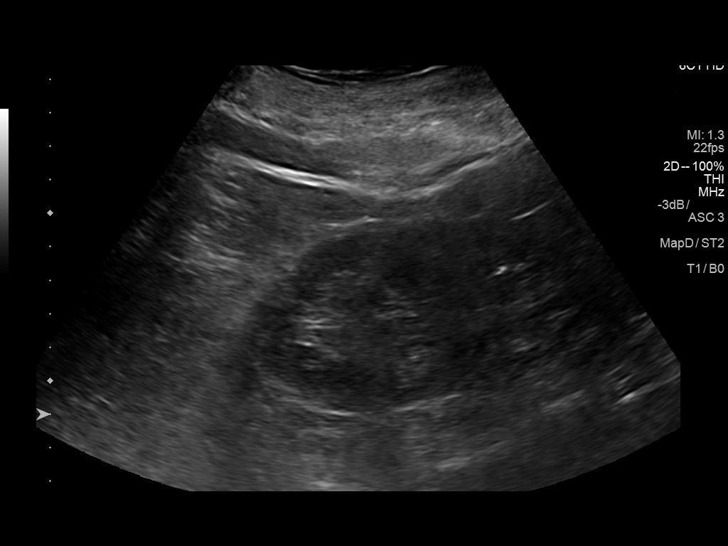
[im 22/36]
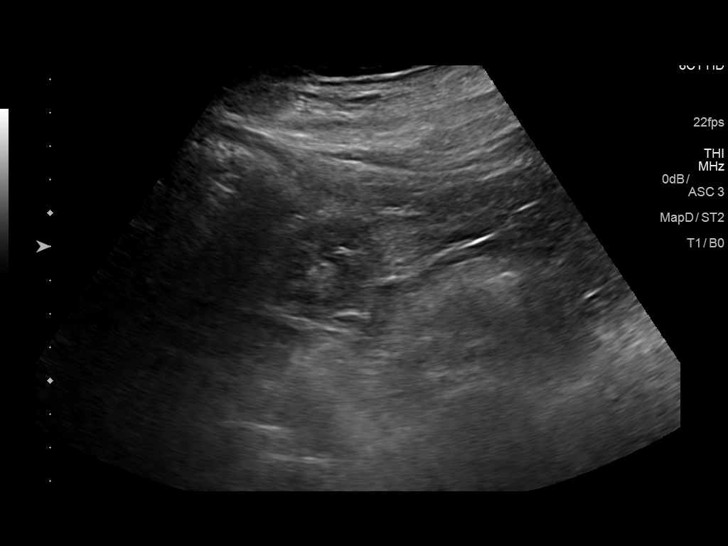
[im 24/36]
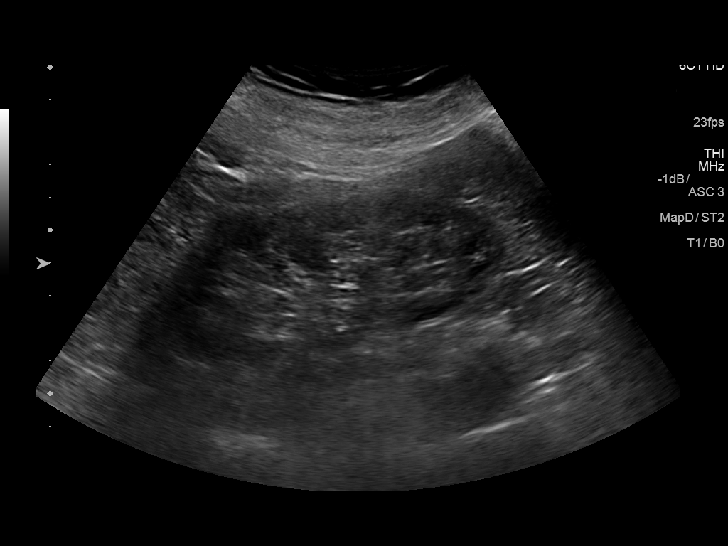
[im 27/36]
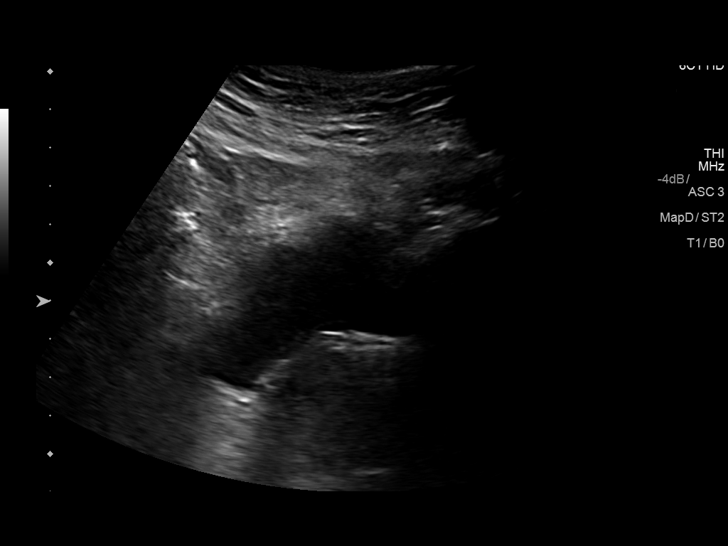
[im 30/36]
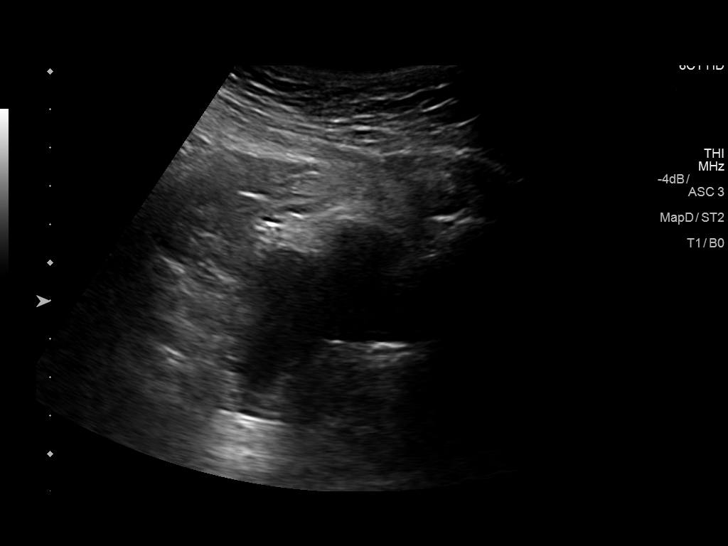
[im 33/36]
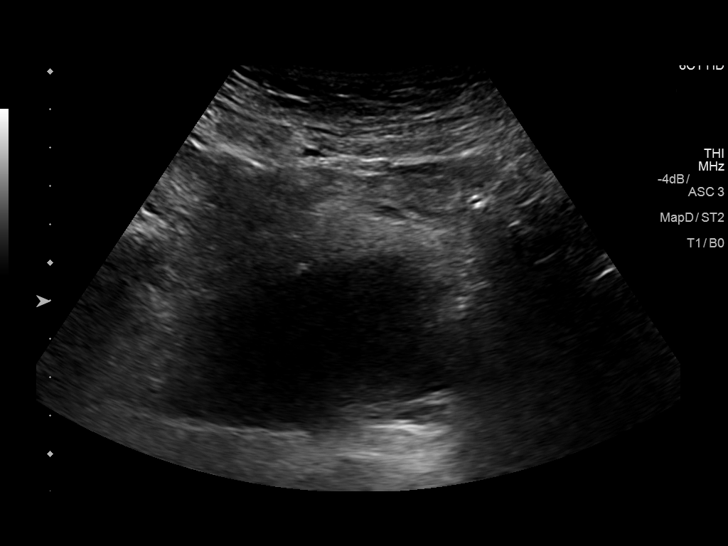
[im 36/36]
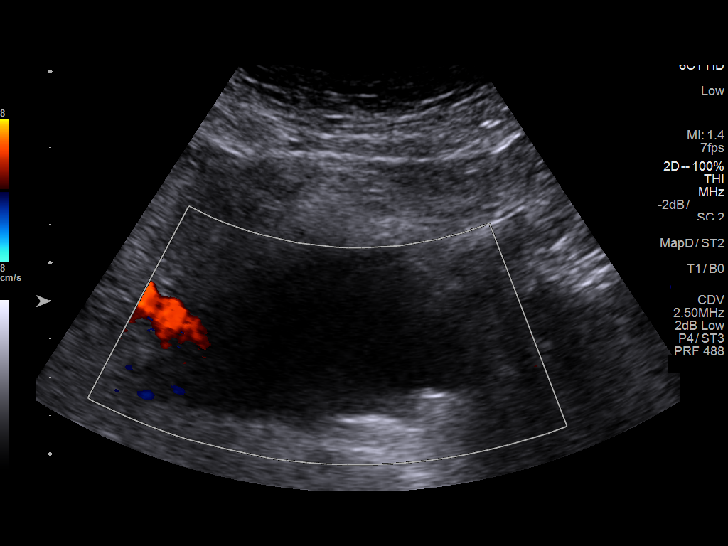

[14 of 25 positions shown; findings below may reference images not displayed]

FINDINGS: Right Kidney:

Length: 9.3 cm. Echogenicity within normal limits. Minimal renal
parenchymal thinning. No mass or hydronephrosis visualized.

Left Kidney:

Length: 11.5 cm.. Echogenicity within normal limits. Minimal renal
parenchymal thinning. No mass or hydronephrosis visualized.

Bladder:

Appears normal for degree of bladder distention.
IMPRESSION: Minimal renal parenchymal thinning bilaterally.

No hydronephrosis.

Partially contracted urinary bladder without gross abnormality.

## 2016-10-19 ENCOUNTER — Ambulatory Visit (INDEPENDENT_AMBULATORY_CARE_PROVIDER_SITE_OTHER): Payer: PPO | Admitting: Cardiovascular Disease

## 2016-10-19 ENCOUNTER — Encounter: Payer: Self-pay | Admitting: Cardiovascular Disease

## 2016-10-19 VITALS — BP 128/64 | HR 60 | Ht 67.0 in | Wt 198.4 lb

## 2016-10-19 DIAGNOSIS — I10 Essential (primary) hypertension: Secondary | ICD-10-CM | POA: Diagnosis not present

## 2016-10-19 NOTE — Patient Instructions (Signed)

## 2016-10-19 NOTE — Progress Notes (Signed)
Cardiology Office Note Date:  10/19/2016   ID:  [name], DOB 11/22/1933, MRN [id number]  PCP:  [name], MD  Cardiologist:  Sherren Mocha, MD    Chief Complaint  Patient presents with  . Follow-up    Essential Hypertension, Benign     History of Present Illness: [name] is a 83 y.o. male who presents for  follow-up evaluation. The patient has been followed for coronary artery disease, hypertension, and hyperlipidemia. The patient underwent PCI (Cypher DES) of the proximal LAD in 2008. He had moderate diffuse residual CAD and medical therapy was recommended. The patient had an exercise treadmill study in 2014 and this demonstrated no significant ST/T changes.  The patient is here alone today. He feels very well. He denies chest pain, shortness of breath, leg swelling, or heart palpitations. His blood pressure has been well controlled. He's meditating for an hour a day and feeling much better since he's been doing this. He has no complaints today.   Past Medical History:  Diagnosis Date  . Allergy   . BPH (benign prostatic hypertrophy)   . CAD (coronary artery disease)    s/p cypher DES to pLAD 6/08; normal LVF;  ETT-Myoview 2009: no ischemia   . Eosinophilia   . HTN (hypertension)   . Hyperlipidemia   . Myocardial infarction [facility])     Past Surgical History:  Procedure Laterality Date  . APPENDECTOMY    . CARDIAC CATHETERIZATION  07/30/2006   CORONARY ANGIOPLASTY WITH STENT PLACEMENT  . EXPLORATORY LAPAROTOMY     age 42    Current Outpatient Prescriptions  Medication Sig Dispense Refill  . ADVAIR DISKUS 250-50 MCG/DOSE AEPB INHALE 1 PUFF INTO THE LUNGS 2 (TWO) TIMES DAILY. 60 each 5  . amLODipine-benazepril (LOTREL) 5-20 MG capsule TAKE 1 CAPSULE BY MOUTH 2 (TWO) TIMES DAILY. 180 capsule 1  . aspirin EC 81 MG tablet Take 1 tablet (81 mg total) by mouth daily.    Marland Kitchen atorvastatin (LIPITOR) 20 MG tablet Take 20 mg by mouth daily.    . Calcium  Carbonate-Vitamin D (CALCARB 600/D) 600-400 MG-UNIT per tablet Take 1 tablet by mouth daily.     . Coenzyme Q-10 100 MG capsule Take 100 mg by mouth daily.    Marland Kitchen doxazosin (CARDURA) 2 MG tablet TAKE 1 TABLET (2 MG TOTAL) BY MOUTH DAILY. 90 tablet 3  . fluticasone (FLONASE) 50 MCG/ACT nasal spray PLACE 2 SPRAYS INTO BOTH NOSTRILS DAILY. 16 g 5  . Multiple Vitamin (MULTIVITAMIN) capsule Take 1 capsule by mouth daily. Resetigen-D  Cellular rejuvenation formula    . NON FORMULARY Take 1 tablet by mouth 2 (two) times daily. Omega Q 1 tab twice daily     . VENTOLIN HFA 108 (90 Base) MCG/ACT inhaler INHALE 2 PUFFS INTO THE LUNGS EVERY 6 (SIX) HOURS AS NEEDED FOR WHEEZING OR SHORTNESS OF BREATH. 18 Inhaler 1   No current facility-administered medications for this visit.     Allergies:   Patient has no known allergies.   Social History:  The patient  reports that he has quit smoking. He has never used smokeless tobacco. He reports that he drinks about 2.4 - 3.6 oz of alcohol per week . He reports that he does not use drugs.   Family History:  The patient's family history includes Aortic aneurysm in his brother; Arthritis in his daughter; Colon cancer in his brother; Coronary artery disease in his brother; Coronary artery disease (age of onset: 78)  in his father; Stomach cancer in his mother.    ROS:  Please see the history of present illness.  All other systems are reviewed and negative.    PHYSICAL EXAM: VS:  BP 128/64   Pulse 60   Ht 5\' 7"  (1.702 m)   Wt 90 kg (198 lb 6.4 oz)   BMI 31.07 kg/m  , BMI Body mass index is 31.07 kg/m. GEN: Well nourished, well developed, very pleasant elderly male in no acute distress  HEENT: normal  Neck: no JVD, no masses. No carotid bruits Cardiac: RRR without murmur or gallop                Respiratory:  clear to auscultation bilaterally, normal work of breathing GI: soft, nontender, nondistended, + BS MS: no deformity or atrophy  Ext: no pretibial edema,  pedal pulses 2+= bilaterally Skin: warm and dry, no rash Neuro:  Strength and sensation are intact Psych: euthymic mood, full affect  EKG:  EKG is ordered today. The ekg ordered today shows normal sinus rhythm 60 bpm, within normal limits.  Recent Labs: 03/29/2016: TSH 1.84 06/18/2016: Hemoglobin 13.6; Platelets 228 06/25/2016: ALT 31; BUN 19; Creat 1.40; Potassium 4.5; Sodium 142   Lipid Panel     Component Value Date/Time   CHOL 144 03/29/2016 0805   TRIG 41 03/29/2016 0805   HDL 55 03/29/2016 0805   CHOLHDL 2.6 03/29/2016 0805   VLDL 8 03/29/2016 0805   LDLCALC 81 03/29/2016 0805      Wt Readings from Last 3 Encounters:  10/19/16 90 kg (198 lb 6.4 oz)  09/04/16 88.5 kg (195 lb)  08/27/16 88.5 kg (195 lb)     ASSESSMENT AND PLAN: 1.  Coronary artery disease, native vessel, without angina: The patient continues to do very well. His medications are reviewed and will be continued.  2. Hypertension: Blood pressure is well controlled on his current regimen of amlodipine/benazepril.  3. Hyperlipidemia: Most recent lipids are reviewed. See above.  Current medicines are reviewed with the patient today.  The patient does not have concerns regarding medicines.  Labs/ tests ordered today include:   Orders Placed This Encounter  Procedures  . EKG 12-Lead   Disposition:   FU one year  Signed, Sherren Mocha, MD  10/19/2016 8:42 AM    [facility] [address], [place], [place]  [address] Phone: [phone]; Fax: [phone]

## 2016-10-23 ENCOUNTER — Telehealth: Payer: Self-pay

## 2016-10-23 NOTE — Telephone Encounter (Signed)
lmom for patient to call back 

## 2016-10-23 NOTE — Telephone Encounter (Signed)
Patient called requesting to stop his ASA stated a report was made in a clinical study and wants to stop medication. Please advise

## 2016-10-23 NOTE — Telephone Encounter (Signed)
I be glad to see him to discuss.  Given his history of cad I feel he should stay on aspirin 81 mg a day.

## 2016-10-23 NOTE — Telephone Encounter (Signed)
Spoke with patient he will come in on his appt date to discuss ASA

## 2016-12-11 ENCOUNTER — Other Ambulatory Visit: Payer: Self-pay | Admitting: Family Medicine

## 2016-12-26 DIAGNOSIS — J3089 Other allergic rhinitis: Secondary | ICD-10-CM | POA: Diagnosis not present

## 2016-12-26 DIAGNOSIS — J029 Acute pharyngitis, unspecified: Secondary | ICD-10-CM | POA: Diagnosis not present

## 2017-01-09 ENCOUNTER — Other Ambulatory Visit: Payer: Self-pay | Admitting: Family Medicine

## 2017-02-14 ENCOUNTER — Other Ambulatory Visit: Payer: Self-pay | Admitting: Family Medicine

## 2017-02-14 ENCOUNTER — Other Ambulatory Visit: Payer: Self-pay | Admitting: Cardiovascular Disease

## 2017-02-14 DIAGNOSIS — I10 Essential (primary) hypertension: Secondary | ICD-10-CM

## 2017-02-14 DIAGNOSIS — E785 Hyperlipidemia, unspecified: Secondary | ICD-10-CM

## 2017-02-14 DIAGNOSIS — I2510 Atherosclerotic heart disease of native coronary artery without angina pectoris: Secondary | ICD-10-CM

## 2017-02-26 ENCOUNTER — Other Ambulatory Visit: Payer: Self-pay | Admitting: Family Medicine

## 2017-02-26 DIAGNOSIS — Z0000 Encounter for general adult medical examination without abnormal findings: Secondary | ICD-10-CM

## 2017-03-28 DIAGNOSIS — H52223 Regular astigmatism, bilateral: Secondary | ICD-10-CM | POA: Diagnosis not present

## 2017-03-28 DIAGNOSIS — H18413 Arcus senilis, bilateral: Secondary | ICD-10-CM | POA: Diagnosis not present

## 2017-03-28 DIAGNOSIS — H524 Presbyopia: Secondary | ICD-10-CM | POA: Diagnosis not present

## 2017-03-28 DIAGNOSIS — Z961 Presence of intraocular lens: Secondary | ICD-10-CM | POA: Diagnosis not present

## 2017-03-31 DIAGNOSIS — J189 Pneumonia, unspecified organism: Secondary | ICD-10-CM | POA: Diagnosis not present

## 2017-04-04 ENCOUNTER — Encounter: Payer: Self-pay | Admitting: Family Medicine

## 2017-04-04 ENCOUNTER — Ambulatory Visit (INDEPENDENT_AMBULATORY_CARE_PROVIDER_SITE_OTHER): Payer: PPO | Admitting: Family Medicine

## 2017-04-04 VITALS — BP 110/58 | HR 72 | Temp 97.7°F | Resp 18 | Ht 67.0 in | Wt 197.0 lb

## 2017-04-04 DIAGNOSIS — Z0000 Encounter for general adult medical examination without abnormal findings: Secondary | ICD-10-CM | POA: Diagnosis not present

## 2017-04-04 DIAGNOSIS — J181 Lobar pneumonia, unspecified organism: Secondary | ICD-10-CM

## 2017-04-04 DIAGNOSIS — E7800 Pure hypercholesterolemia, unspecified: Secondary | ICD-10-CM

## 2017-04-04 DIAGNOSIS — J189 Pneumonia, unspecified organism: Secondary | ICD-10-CM

## 2017-04-04 DIAGNOSIS — I10 Essential (primary) hypertension: Secondary | ICD-10-CM

## 2017-04-04 DIAGNOSIS — I2510 Atherosclerotic heart disease of native coronary artery without angina pectoris: Secondary | ICD-10-CM

## 2017-04-04 NOTE — Progress Notes (Signed)
Subjective:    Patient ID: [name], male    DOB: 04-29-1933, 84 y.o.   MRN: [id number]  HPI Patient is here today for complete physical exam.  Was recently diagnosed with left lower lobe pneumonia in urgent care and was treated with Levaquin.  Was also recently diagnosed with cataracts and an optometry evaluation however he denies any blurry vision or trouble seeing at night.  Otherwise he is doing well with no concerns.  Due to his advanced age, I have recommended against a colonoscopy or prostate cancer screening   Immunization History  Administered Date(s) Administered  . Influenza Split 10/06/2012  . Influenza, High Dose Seasonal PF 12/02/2016  . Influenza,inj,Quad PF,6+ Mos 11/12/2013, 11/11/2014, 10/20/2015  . Pneumococcal Conjugate-13 03/17/2013  . Pneumococcal Polysaccharide-23 02/05/2001  . Td 03/01/2009  . Zoster 02/25/2008    Past Medical History:  Diagnosis Date  . Allergy   . BPH (benign prostatic hypertrophy)   . CAD (coronary artery disease)    s/p cypher DES to pLAD 6/08; normal LVF;  ETT-Myoview 2009: no ischemia   . Eosinophilia   . HTN (hypertension)   . Hyperlipidemia   . Myocardial infarction [facility])    Past Surgical History:  Procedure Laterality Date  . APPENDECTOMY    . CARDIAC CATHETERIZATION  07/30/2006   CORONARY ANGIOPLASTY WITH STENT PLACEMENT  . EXPLORATORY LAPAROTOMY     age 36   Current Outpatient Medications on File Prior to Visit  Medication Sig Dispense Refill  . ADVAIR DISKUS 250-50 MCG/DOSE AEPB INHALE 1 PUFF INTO THE LUNGS 2 (TWO) TIMES DAILY. 60 each 5  . amLODipine-benazepril (LOTREL) 5-20 MG capsule TAKE ONE CAPSULE TWICE A DAY 180 capsule 2  . aspirin EC 81 MG tablet Take 1 tablet (81 mg total) by mouth daily.    Marland Kitchen atorvastatin (LIPITOR) 20 MG tablet Take 20 mg by mouth daily.    Marland Kitchen atorvastatin (LIPITOR) 20 MG tablet TAKE 1 TABLET EVERY DAY 90 tablet 3  . Calcium Carbonate-Vitamin D (CALCARB 600/D) 600-400 MG-UNIT per  tablet Take 1 tablet by mouth daily.     . Coenzyme Q-10 100 MG capsule Take 100 mg by mouth daily.    Marland Kitchen doxazosin (CARDURA) 2 MG tablet TAKE 1 TABLET (2 MG TOTAL) BY MOUTH DAILY. 90 tablet 3  . fluticasone (FLONASE) 50 MCG/ACT nasal spray USE 2 SPRAYS INTO BOTH NOSTRILS DAILY. 48 g 3  . Multiple Vitamin (MULTIVITAMIN) capsule Take 1 capsule by mouth daily. Resetigen-D  Cellular rejuvenation formula    . NON FORMULARY Take 1 tablet by mouth 2 (two) times daily. Omega Q 1 tab twice daily      No current facility-administered medications on file prior to visit.    No Known Allergies Social History   Socioeconomic History  . Marital status: Married    Spouse name: [name]  . Number of children: 1  . Years of education: Not on file  . Highest education level: Not on file  Social Needs  . Financial resource strain: Not on file  . Food insecurity - worry: Not on file  . Food insecurity - inability: Not on file  . Transportation needs - medical: Not on file  . Transportation needs - non-medical: Not on file  Occupational History  . Occupation: retired professor    Comment: History   . Occupation: missionary  Tobacco Use  . Smoking status: Former Research scientist (life sciences)  . Smokeless tobacco: Never Used  . Tobacco comment: quit over 64yrs ago.  Substance and Sexual Activity  . Alcohol use: Yes    Alcohol/week: 2.4 - 3.6 oz    Types: 4 - 6 Glasses of wine per week  . Drug use: No  . Sexual activity: Yes    Partners: Female  Other Topics Concern  . Not on file  Social History Narrative   Lives with his wife (second marriage in 2015, first marriage ended when his wife died alzheimer's disease). Since his remarriage, his adult daughter doesn't speak with him.   Family History  Problem Relation Age of Onset  . Coronary artery disease Father 48  . Coronary artery disease Brother   . Aortic aneurysm Brother   . Colon cancer Brother   . Stomach cancer Mother   . Esophageal cancer Neg Hx   . Rectal  cancer Neg Hx   . Arthritis Daughter        rheumatoid     Review of Systems  All other systems reviewed and are negative.      Objective:   Physical Exam  Constitutional: He is oriented to person, place, and time. He appears well-developed and well-nourished. No distress.  HENT:  Head: Normocephalic and atraumatic.  Right Ear: External ear normal.  Left Ear: External ear normal.  Nose: Nose normal.  Mouth/Throat: Oropharynx is clear and moist. No oropharyngeal exudate.  Eyes: Conjunctivae and EOM are normal. Pupils are equal, round, and reactive to light. Right eye exhibits no discharge. Left eye exhibits no discharge. No scleral icterus.  Neck: Normal range of motion. Neck supple. No JVD present. No tracheal deviation present. No thyromegaly present.  Cardiovascular: Normal rate, regular rhythm, normal heart sounds and intact distal pulses. Exam reveals no gallop and no friction rub.  No murmur heard. Pulmonary/Chest: Effort normal and breath sounds normal. No stridor. No respiratory distress. He has no wheezes. He has no rales. He exhibits no tenderness.  Abdominal: Soft. Bowel sounds are normal. He exhibits no distension and no mass. There is no tenderness. There is no rebound and no guarding.  Musculoskeletal: Normal range of motion. He exhibits no edema or tenderness.  Lymphadenopathy:    He has no cervical adenopathy.  Neurological: He is alert and oriented to person, place, and time. He has normal reflexes. No cranial nerve deficit. He exhibits normal muscle tone. Coordination normal.  Skin: Skin is warm. No rash noted. He is not diaphoretic. No erythema. No pallor.  Psychiatric: He has a normal mood and affect. His behavior is normal. Judgment and thought content normal.  Vitals reviewed.         Assessment & Plan:  Routine general medical examination at a health care facility  ASCVD (arteriosclerotic cardiovascular disease)  Essential hypertension  Pure  hypercholesterolemia  Patient's physical exam today is completely normal.  Clinically his pneumonia has appeared to resolve.  I have recommended a follow-up chest x-ray in 3 weeks to ensure radiographic resolution.  I recommended against a colonoscopy or prostate cancer screening.  Immunizations are up-to-date except for his second shingles vaccine.  I have recommended this but encouraged him to check on the price prior to receiving it.  I would like him to return fasting so that we can check a CBC, CMP, fasting lipid panel.  Given his history of coronary artery disease, his goal LDL cholesterol is less than 70.  His blood pressure today is well controlled.  He does appear to have cataracts on physical exam.  However the present time they are asymptomatic.  Therefore  I recommended no surgery at present.  If he becomes symptomatic from the cataracts, I will happily refer him to ophthalmology

## 2017-04-08 ENCOUNTER — Other Ambulatory Visit: Payer: PPO

## 2017-04-09 ENCOUNTER — Other Ambulatory Visit: Payer: PPO

## 2017-04-09 DIAGNOSIS — I2510 Atherosclerotic heart disease of native coronary artery without angina pectoris: Secondary | ICD-10-CM | POA: Diagnosis not present

## 2017-04-10 LAB — CBC WITH DIFFERENTIAL/PLATELET
Basophils Absolute: 28 cells/uL (ref 0–200)
Basophils Relative: 0.3 %
Eosinophils Absolute: 753 cells/uL — ABNORMAL HIGH (ref 15–500)
Eosinophils Relative: 8.1 %
HCT: 38.2 % — ABNORMAL LOW (ref 38.5–50.0)
Hemoglobin: 13.2 g/dL (ref 13.2–17.1)
Lymphs Abs: 1116 cells/uL (ref 850–3900)
MCH: 32 pg (ref 27.0–33.0)
MCHC: 34.6 g/dL (ref 32.0–36.0)
MCV: 92.5 fL (ref 80.0–100.0)
MPV: 8.9 fL (ref 7.5–12.5)
Monocytes Relative: 9.9 %
Neutro Abs: 6482 cells/uL (ref 1500–7800)
Neutrophils Relative %: 69.7 %
Platelets: 288 10*3/uL (ref 140–400)
RBC: 4.13 10*6/uL — ABNORMAL LOW (ref 4.20–5.80)
RDW: 12 % (ref 11.0–15.0)
Total Lymphocyte: 12 %
WBC mixed population: 921 cells/uL (ref 200–950)
WBC: 9.3 10*3/uL (ref 3.8–10.8)

## 2017-04-10 LAB — COMPLETE METABOLIC PANEL WITH GFR
AG Ratio: 1.8 (calc) (ref 1.0–2.5)
ALT: 32 U/L (ref 9–46)
AST: 29 U/L (ref 10–35)
Albumin: 3.8 g/dL (ref 3.6–5.1)
Alkaline phosphatase (APISO): 111 U/L (ref 40–115)
BUN/Creatinine Ratio: 22 (calc) (ref 6–22)
BUN: 42 mg/dL — ABNORMAL HIGH (ref 7–25)
CO2: 25 mmol/L (ref 20–32)
Calcium: 9.3 mg/dL (ref 8.6–10.3)
Chloride: 108 mmol/L (ref 98–110)
Creat: 1.93 mg/dL — ABNORMAL HIGH (ref 0.70–1.11)
GFR, Est African American: 37 mL/min/{1.73_m2} — ABNORMAL LOW (ref >=60)
GFR, Est Non African American: 32 mL/min/{1.73_m2} — ABNORMAL LOW (ref >=60)
Globulin: 2.1 g/dL (calc) (ref 1.9–3.7)
Glucose, Bld: 108 mg/dL — ABNORMAL HIGH (ref 65–99)
Potassium: 5 mmol/L (ref 3.5–5.3)
Sodium: 140 mmol/L (ref 135–146)
Total Bilirubin: 0.5 mg/dL (ref 0.2–1.2)
Total Protein: 5.9 g/dL — ABNORMAL LOW (ref 6.1–8.1)

## 2017-04-10 LAB — LIPID PANEL
Cholesterol: 136 mg/dL (ref <200)
HDL: 50 mg/dL (ref >40)
LDL Cholesterol (Calc): 71 mg/dL (calc)
Non-HDL Cholesterol (Calc): 86 mg/dL (calc) (ref <130)
Total CHOL/HDL Ratio: 2.7 (calc) (ref <5.0)
Triglycerides: 74 mg/dL (ref <150)

## 2017-04-12 ENCOUNTER — Other Ambulatory Visit: Payer: Self-pay | Admitting: Family Medicine

## 2017-04-12 DIAGNOSIS — R944 Abnormal results of kidney function studies: Secondary | ICD-10-CM

## 2017-04-18 ENCOUNTER — Other Ambulatory Visit: Payer: Self-pay | Admitting: Family Medicine

## 2017-04-18 NOTE — Telephone Encounter (Signed)
Ok to refill?  I do not see medication on current list.

## 2017-04-23 ENCOUNTER — Ambulatory Visit
Admission: RE | Admit: 2017-04-23 | Discharge: 2017-04-23 | Disposition: A | Discharge status: Home / Self Care | Payer: PPO | Source: Ambulatory Visit | Attending: Family Medicine | Admitting: Family Medicine

## 2017-04-23 DIAGNOSIS — R944 Abnormal results of kidney function studies: Secondary | ICD-10-CM

## 2017-04-24 ENCOUNTER — Ambulatory Visit
Admission: RE | Admit: 2017-04-24 | Discharge: 2017-04-24 | Disposition: A | Discharge status: Home / Self Care | Payer: PPO | Source: Ambulatory Visit | Attending: Family Medicine | Admitting: Family Medicine

## 2017-04-24 ENCOUNTER — Other Ambulatory Visit: Payer: Self-pay | Admitting: Family Medicine

## 2017-04-24 DIAGNOSIS — J189 Pneumonia, unspecified organism: Secondary | ICD-10-CM

## 2017-04-24 DIAGNOSIS — J181 Lobar pneumonia, unspecified organism: Principal | ICD-10-CM

## 2017-04-24 IMAGING — DX DG CHEST 1V
1 series · 1 of 1 positions shown · non-contrast
Comparison: Chest x-ray [DATE]

CLINICAL DATA: History of left lower lobe pneumonia, follow-up

EXAM:
CHEST  1 VIEW

[dg chest 2 view]
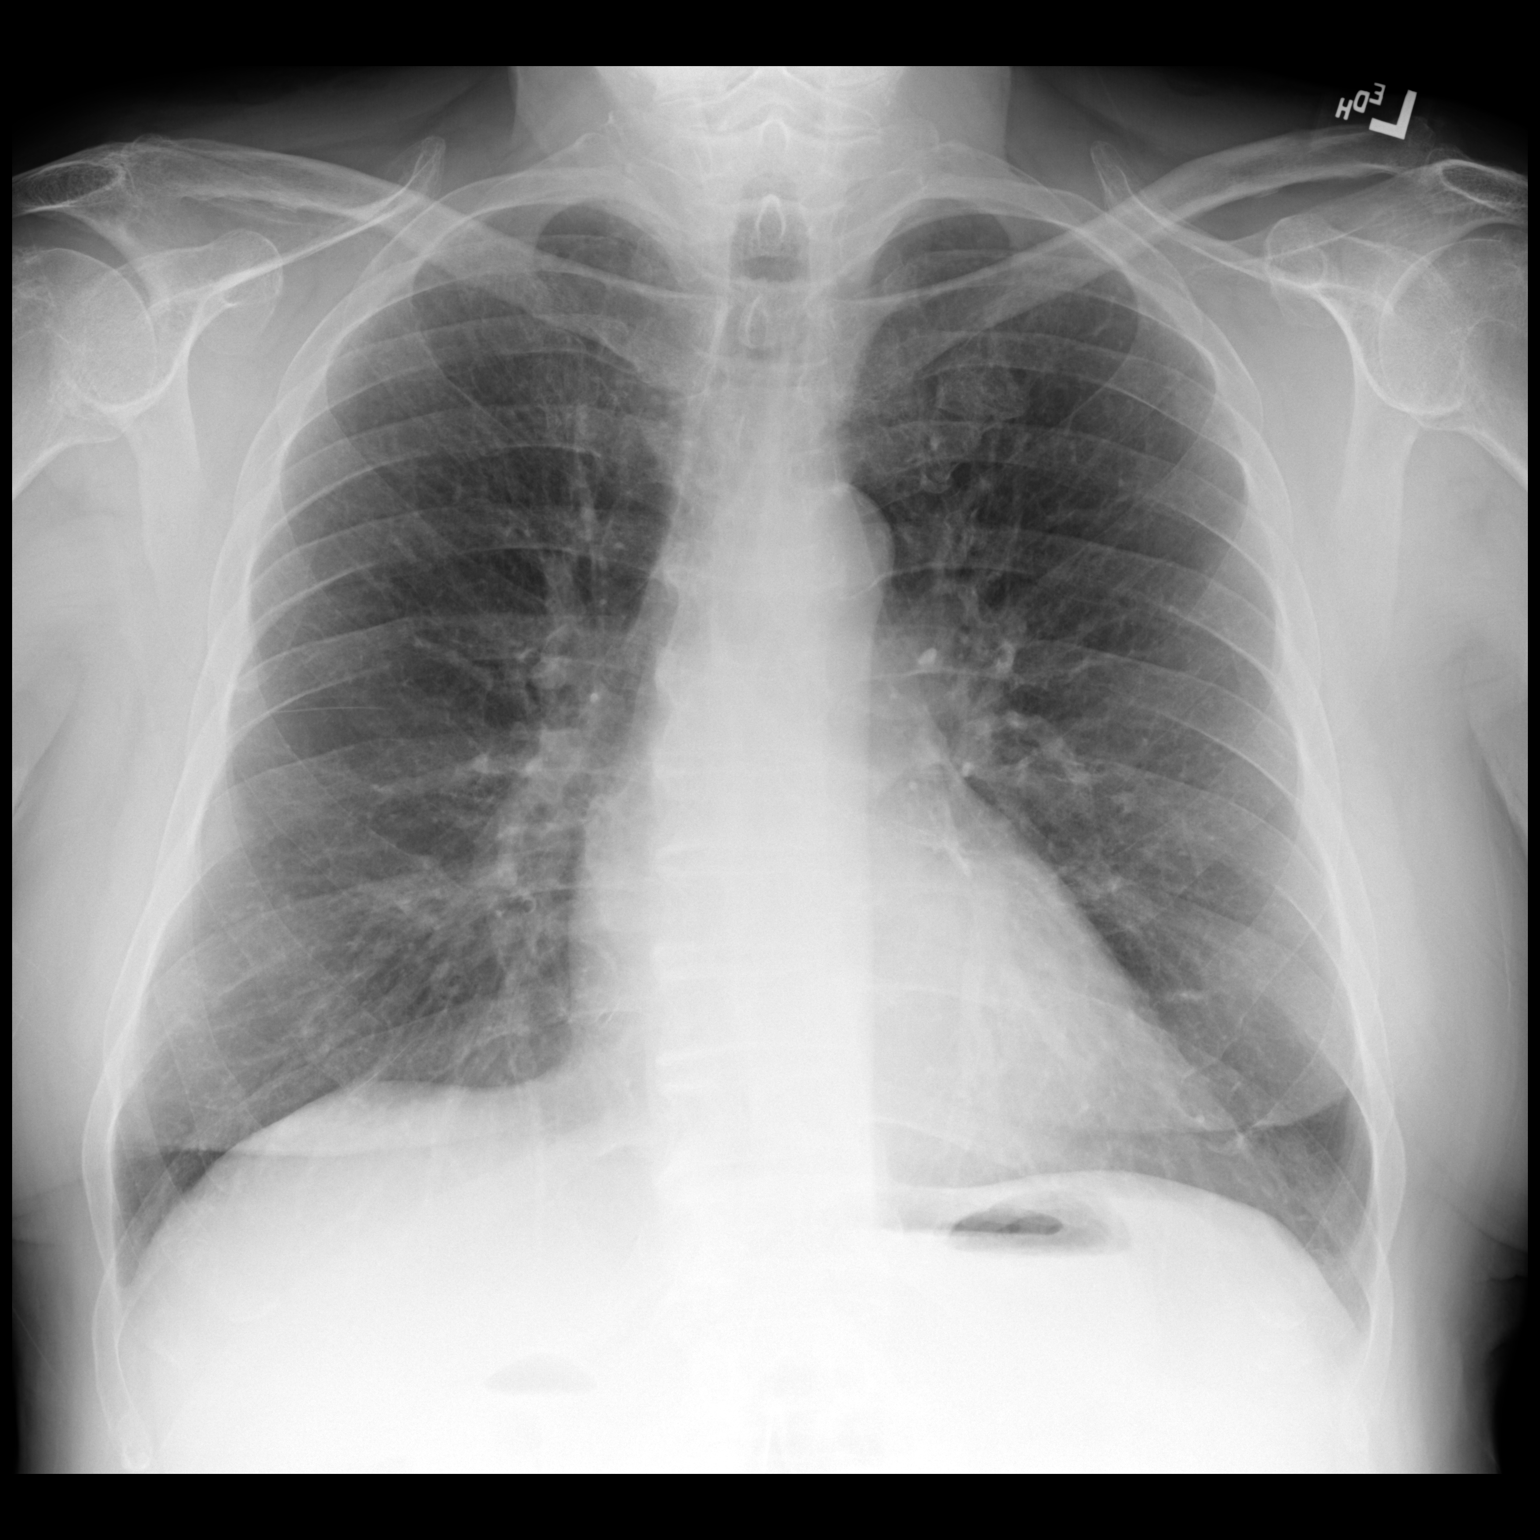

[1 of 1 positions shown; findings below may reference images not displayed]

FINDINGS: PA chest x-ray shows the lungs to be clear. No pneumonia or pleural
effusion is seen. A lateral view was also obtained. However before
that image was transferred for interpretation, there was an
electrical out age for the entire [HOSPITAL] went [REDACTED].
After electrical power was restored, that lateral view was corrupted
and not accessible, and the patient had already left. If the lateral
view is felt to be necessary, that view can be obtained at no charge
to the patient. However, on the frontal view no abnormality is
evident. Heart size is normal. No bony abnormality is seen.
IMPRESSION: 1. No abnormality seen on the frontal view of the chest in this
patient who has a history of recent left lower lobe pneumonia.
2. The lateral view was corrupted and not available for
interpretation as noted above.

## 2017-04-25 ENCOUNTER — Encounter: Payer: Self-pay | Admitting: Family Medicine

## 2017-05-06 ENCOUNTER — Encounter: Payer: Self-pay | Admitting: Family Medicine

## 2017-05-06 ENCOUNTER — Ambulatory Visit (INDEPENDENT_AMBULATORY_CARE_PROVIDER_SITE_OTHER): Payer: PPO | Admitting: Family Medicine

## 2017-05-06 VITALS — BP 130/56 | HR 70 | Temp 97.9°F | Resp 16 | Ht 67.0 in | Wt 204.0 lb

## 2017-05-06 DIAGNOSIS — R61 Generalized hyperhidrosis: Secondary | ICD-10-CM

## 2017-05-06 DIAGNOSIS — R7309 Other abnormal glucose: Secondary | ICD-10-CM | POA: Diagnosis not present

## 2017-05-06 NOTE — Progress Notes (Signed)
Subjective:    Patient ID: [name], male    DOB: [birth year], 84 y.o.   MRN: [id number]  HPI patient is a very pleasant 84 year old Caucasian male who presents today with night sweats for the last 3 weeks.  He has been monitoring his temperature and has never recorded a fever.  The highest his temperature has recorded was 97.7.  He denies feeling sick or ill.  He denies any otalgia, sore throat, sinus pain, nausea vomiting or diarrhea.  He denies any cough or chest pain.  He denies any dysuria or hematuria.  He denies any bone pain other than normal aches and pains in his joints primarily on the right side.  He denies any weight loss, bruising, unexplained bleeding.  This only occurs at night.  It is isolated just to his chest wall.  The dampness of his shirt will awaken him from sleep.  However after awakening, the patient will have intrusive thoughts.  He is a Estate manager/land agent.  He believes that he is being confronted by demonic forces at night.  In that, I mean that the patient feels they are putting bad thoughts, self doubt, resentment, in his mind that plague him.  At the present time, he is working for [employer].  He feels resentment because he is performing the majority of the labor and people are taking advantage of his good nature..   Immunization History  Administered Date(s) Administered  . Influenza Split 10/06/2012  . Influenza, High Dose Seasonal PF 12/02/2016  . Influenza,inj,Quad PF,6+ Mos 11/12/2013, 11/11/2014, 10/20/2015  . Pneumococcal Conjugate-13 03/17/2013  . Pneumococcal Polysaccharide-23 02/05/2001  . Td 03/01/2009  . Zoster 02/25/2008    Past Medical History:  Diagnosis Date  . Allergy   . BPH (benign prostatic hypertrophy)   . CAD (coronary artery disease)    s/p cypher DES to pLAD 6/08; normal LVF;  ETT-Myoview 2009: no ischemia   . Eosinophilia   . HTN (hypertension)   . Hyperlipidemia   . Myocardial infarction [facility])    Past Surgical History:    Procedure Laterality Date  . APPENDECTOMY    . CARDIAC CATHETERIZATION  07/30/2006   CORONARY ANGIOPLASTY WITH STENT PLACEMENT  . EXPLORATORY LAPAROTOMY     age 65   Current Outpatient Medications on File Prior to Visit  Medication Sig Dispense Refill  . ADVAIR DISKUS 250-50 MCG/DOSE AEPB INHALE 1 PUFF INTO THE LUNGS 2 (TWO) TIMES DAILY. 60 each 5  . amLODipine-benazepril (LOTREL) 5-20 MG capsule TAKE ONE CAPSULE TWICE A DAY 180 capsule 2  . aspirin EC 81 MG tablet Take 1 tablet (81 mg total) by mouth daily.    Marland Kitchen atorvastatin (LIPITOR) 20 MG tablet Take 20 mg by mouth daily.    . Calcium Carbonate-Vitamin D (CALCARB 600/D) 600-400 MG-UNIT per tablet Take 1 tablet by mouth daily.     . Coenzyme Q-10 100 MG capsule Take 100 mg by mouth daily.    Marland Kitchen doxazosin (CARDURA) 2 MG tablet TAKE 1 TABLET (2 MG TOTAL) BY MOUTH DAILY. 90 tablet 3  . fluticasone (FLONASE) 50 MCG/ACT nasal spray USE 2 SPRAYS INTO BOTH NOSTRILS DAILY. 48 g 3  . Multiple Vitamin (MULTIVITAMIN) capsule Take 1 capsule by mouth daily. Resetigen-D  Cellular rejuvenation formula    . NON FORMULARY Take 1 tablet by mouth 2 (two) times daily. Omega Q 1 tab twice daily     . traMADol (ULTRAM) 50 MG tablet TAKE 1 TABLET EVERY 6 HOURS AS NEEDED  60 tablet 0   No current facility-administered medications on file prior to visit.    No Known Allergies Social History   Socioeconomic History  . Marital status: Married    Spouse name: [name]  . Number of children: 1  . Years of education: Not on file  . Highest education level: Not on file  Occupational History  . Occupation: retired professor    Comment: History   . Occupation: missionary  Social Needs  . Financial resource strain: Not on file  . Food insecurity:    Worry: Not on file    Inability: Not on file  . Transportation needs:    Medical: Not on file    Non-medical: Not on file  Tobacco Use  . Smoking status: Former Research scientist (life sciences)  . Smokeless tobacco: Never Used  .  Tobacco comment: quit over 87yrs ago.  Substance and Sexual Activity  . Alcohol use: Yes    Alcohol/week: 2.4 - 3.6 oz    Types: 4 - 6 Glasses of wine per week  . Drug use: No  . Sexual activity: Yes    Partners: Female  Lifestyle  . Physical activity:    Days per week: Not on file    Minutes per session: Not on file  . Stress: Not on file  Relationships  . Social connections:    Talks on phone: Not on file    Gets together: Not on file    Attends religious service: Not on file    Active member of club or organization: Not on file    Attends meetings of clubs or organizations: Not on file    Relationship status: Not on file  . Intimate partner violence:    Fear of current or ex partner: Not on file    Emotionally abused: Not on file    Physically abused: Not on file    Forced sexual activity: Not on file  Other Topics Concern  . Not on file  Social History Narrative   Lives with his wife (second marriage in 2015, first marriage ended when his wife died alzheimer's disease). Since his remarriage, his adult daughter doesn't speak with him.   Family History  Problem Relation Age of Onset  . Coronary artery disease Father 22  . Coronary artery disease Brother   . Aortic aneurysm Brother   . Colon cancer Brother   . Stomach cancer Mother   . Esophageal cancer Neg Hx   . Rectal cancer Neg Hx   . Arthritis Daughter        rheumatoid     Review of Systems  All other systems reviewed and are negative.      Objective:   Physical Exam  Constitutional: He is oriented to person, place, and time. He appears well-developed and well-nourished. No distress.  HENT:  Head: Normocephalic and atraumatic.  Right Ear: External ear normal.  Left Ear: External ear normal.  Nose: Nose normal.  Mouth/Throat: Oropharynx is clear and moist. No oropharyngeal exudate.  Eyes: Pupils are equal, round, and reactive to light. Conjunctivae and EOM are normal. Right eye exhibits no discharge.  Left eye exhibits no discharge. No scleral icterus.  Neck: Normal range of motion. Neck supple. No JVD present. No tracheal deviation present. No thyromegaly present.  Cardiovascular: Normal rate, regular rhythm, normal heart sounds and intact distal pulses. Exam reveals no gallop and no friction rub.  No murmur heard. Pulmonary/Chest: Effort normal and breath sounds normal. No stridor. No respiratory distress. He has  no wheezes. He has no rales. He exhibits no tenderness.  Abdominal: Soft. Bowel sounds are normal. He exhibits no distension and no mass. There is no tenderness. There is no rebound and no guarding.  Musculoskeletal: Normal range of motion. He exhibits no edema or tenderness.  Lymphadenopathy:    He has no cervical adenopathy.  Neurological: He is alert and oriented to person, place, and time. He has normal reflexes. No cranial nerve deficit. He exhibits normal muscle tone. Coordination normal.  Skin: Skin is warm. No rash noted. He is not diaphoretic. No erythema. No pallor.  Psychiatric: He has a normal mood and affect. His behavior is normal. Judgment and thought content normal.  Vitals reviewed.         Assessment & Plan:  Night sweats - Plan: CBC with Differential/Platelet, COMPLETE METABOLIC PANEL WITH GFR, TSH, C-reactive protein, Testosterone Total,Free,Bio, Males  Patient did work in areas potentially prone to TB.  However he denies any hemoptysis.  He denies any cough.  He denies any shortness of breath.  At the present time I will not screen for tuberculosis.  He denies true fever of unknown origin.  Differential diagnosis includes kidney infections, malignancy, autoimmune diseases.  Other possibilities would be obstructive sleep apnea causing him to wake up soaked in sweat or hormone deficiency such as testosterone deficiencies or hyperthyroidism.  However after talking with the patient, knowing the depths of his religious conviction, I feel that this may be related to  stress.  Therefore I will check a CBC, CMP, TSH, CRP, and testosterone level.  If all of this is normal, I will discussed with him trying something at night for insomnia and stress.  Consider a sleep study if persistent as well as a workup for fever of unknown origin

## 2017-05-08 LAB — CBC WITH DIFFERENTIAL/PLATELET
Basophils Absolute: 30 cells/uL (ref 0–200)
Basophils Relative: 0.5 %
Eosinophils Absolute: 519 cells/uL — ABNORMAL HIGH (ref 15–500)
Eosinophils Relative: 8.8 %
HCT: 37 % — ABNORMAL LOW (ref 38.5–50.0)
Hemoglobin: 12.6 g/dL — ABNORMAL LOW (ref 13.2–17.1)
Lymphs Abs: 838 cells/uL — ABNORMAL LOW (ref 850–3900)
MCH: 31.9 pg (ref 27.0–33.0)
MCHC: 34.1 g/dL (ref 32.0–36.0)
MCV: 93.7 fL (ref 80.0–100.0)
MPV: 9.3 fL (ref 7.5–12.5)
Monocytes Relative: 7.1 %
Neutro Abs: 4095 cells/uL (ref 1500–7800)
Neutrophils Relative %: 69.4 %
Platelets: 282 10*3/uL (ref 140–400)
RBC: 3.95 10*6/uL — ABNORMAL LOW (ref 4.20–5.80)
RDW: 12.4 % (ref 11.0–15.0)
Total Lymphocyte: 14.2 %
WBC mixed population: 419 cells/uL (ref 200–950)
WBC: 5.9 10*3/uL (ref 3.8–10.8)

## 2017-05-08 LAB — COMPLETE METABOLIC PANEL WITH GFR
AG Ratio: 1.8 (calc) (ref 1.0–2.5)
ALT: 20 U/L (ref 9–46)
AST: 28 U/L (ref 10–35)
Albumin: 4.2 g/dL (ref 3.6–5.1)
Alkaline phosphatase (APISO): 91 U/L (ref 40–115)
BUN/Creatinine Ratio: 14 (calc) (ref 6–22)
BUN: 24 mg/dL (ref 7–25)
CO2: 26 mmol/L (ref 20–32)
Calcium: 9.6 mg/dL (ref 8.6–10.3)
Chloride: 104 mmol/L (ref 98–110)
Creat: 1.69 mg/dL — ABNORMAL HIGH (ref 0.70–1.11)
GFR, Est African American: 43 mL/min/{1.73_m2} — ABNORMAL LOW (ref >=60)
GFR, Est Non African American: 37 mL/min/{1.73_m2} — ABNORMAL LOW (ref >=60)
Globulin: 2.3 g/dL (calc) (ref 1.9–3.7)
Glucose, Bld: 184 mg/dL — ABNORMAL HIGH (ref 65–99)
Potassium: 5.1 mmol/L (ref 3.5–5.3)
Sodium: 140 mmol/L (ref 135–146)
Total Bilirubin: 0.5 mg/dL (ref 0.2–1.2)
Total Protein: 6.5 g/dL (ref 6.1–8.1)

## 2017-05-08 LAB — HEMOGLOBIN A1C W/OUT EAG: Hgb A1c MFr Bld: 6.2 % of total Hgb — ABNORMAL HIGH (ref <5.7)

## 2017-05-08 LAB — TEST AUTHORIZATION

## 2017-05-08 LAB — TESTOSTERONE TOTAL,FREE,BIO, MALES
Albumin: 4.2 g/dL (ref 3.6–5.1)
Sex Hormone Binding: 33 nmol/L (ref 22–77)
Testosterone: 124 ng/dL — ABNORMAL LOW (ref 250–827)

## 2017-05-08 LAB — C-REACTIVE PROTEIN: CRP: 0.7 mg/L (ref <8.0)

## 2017-05-08 LAB — TSH: TSH: 0.94 mIU/L (ref 0.40–4.50)

## 2017-05-09 ENCOUNTER — Encounter: Payer: Self-pay | Admitting: Family Medicine

## 2017-05-09 ENCOUNTER — Encounter (INDEPENDENT_AMBULATORY_CARE_PROVIDER_SITE_OTHER): Payer: Self-pay

## 2017-05-09 DIAGNOSIS — E119 Type 2 diabetes mellitus without complications: Secondary | ICD-10-CM | POA: Insufficient documentation

## 2017-05-09 DIAGNOSIS — R7303 Prediabetes: Secondary | ICD-10-CM | POA: Insufficient documentation

## 2017-05-18 ENCOUNTER — Encounter: Payer: Self-pay | Admitting: Family Medicine

## 2017-05-21 ENCOUNTER — Other Ambulatory Visit: Payer: Self-pay | Admitting: Family Medicine

## 2017-05-21 MED ORDER — ESCITALOPRAM OXALATE 10 MG PO TABS: 10.0000 mg | ORAL_TABLET | Freq: Every day | ORAL | 3 refills | Status: DC

## 2017-05-27 ENCOUNTER — Telehealth: Payer: Self-pay | Admitting: *Deleted

## 2017-05-27 ENCOUNTER — Encounter: Payer: Self-pay | Admitting: Family Medicine

## 2017-05-27 NOTE — Telephone Encounter (Signed)
Received new orders from PCP to order 24 hour Holter Monitor for tachycardia.   Call placed to [facility] and orders placed.

## 2017-05-29 NOTE — Telephone Encounter (Signed)
Received Holter monitor and pt aware via mychart to come in for placement.

## 2017-05-30 ENCOUNTER — Ambulatory Visit: Payer: Self-pay

## 2017-05-30 ENCOUNTER — Encounter: Payer: Self-pay | Admitting: Family Medicine

## 2017-06-03 ENCOUNTER — Ambulatory Visit: Payer: PPO

## 2017-06-03 DIAGNOSIS — R61 Generalized hyperhidrosis: Secondary | ICD-10-CM

## 2017-06-03 DIAGNOSIS — R000 Tachycardia, unspecified: Secondary | ICD-10-CM | POA: Diagnosis not present

## 2017-06-03 NOTE — Progress Notes (Signed)
Holter monitor applied. Patient informed to come back in 24 hours to have holter monitor removed. Informed him also to not shower while wearing monitor. Patient verbalized understanding.

## 2017-06-04 ENCOUNTER — Ambulatory Visit: Payer: PPO

## 2017-06-04 DIAGNOSIS — R61 Generalized hyperhidrosis: Secondary | ICD-10-CM

## 2017-06-04 NOTE — Progress Notes (Signed)
Pick up order# [id number]

## 2017-06-12 ENCOUNTER — Encounter: Payer: Self-pay | Admitting: Family Medicine

## 2017-06-12 ENCOUNTER — Other Ambulatory Visit: Payer: Self-pay | Admitting: Family Medicine

## 2017-06-20 ENCOUNTER — Encounter: Payer: Self-pay | Admitting: Family Medicine

## 2017-06-20 ENCOUNTER — Ambulatory Visit (INDEPENDENT_AMBULATORY_CARE_PROVIDER_SITE_OTHER): Payer: PPO | Admitting: Family Medicine

## 2017-06-20 VITALS — BP 110/60 | HR 59 | Temp 97.9°F | Resp 16 | Ht 67.0 in | Wt 205.0 lb

## 2017-06-20 DIAGNOSIS — I471 Supraventricular tachycardia: Secondary | ICD-10-CM | POA: Diagnosis not present

## 2017-06-20 DIAGNOSIS — F418 Other specified anxiety disorders: Secondary | ICD-10-CM

## 2017-06-20 NOTE — Progress Notes (Signed)
Subjective:    Patient ID: [name], male    DOB: April 13, 1933, 84 y.o.   MRN: [id number]  HPI  05/06/17 patient is a very pleasant 84 year old Caucasian male who presents today with night sweats for the last 3 weeks.  He has been monitoring his temperature and has never recorded a fever.  The highest his temperature has recorded was 97.7.  He denies feeling sick or ill.  He denies any otalgia, sore throat, sinus pain, nausea vomiting or diarrhea.  He denies any cough or chest pain.  He denies any dysuria or hematuria.  He denies any bone pain other than normal aches and pains in his joints primarily on the right side.  He denies any weight loss, bruising, unexplained bleeding.  This only occurs at night.  It is isolated just to his chest wall.  The dampness of his shirt will awaken him from sleep.  However after awakening, the patient will have intrusive thoughts.  He is a Estate manager/land agent.  He believes that he is being confronted by demonic forces at night.  In that, I mean that the patient feels they are putting bad thoughts, self doubt, resentment, in his mind that plague him.  At the present time, he is working for [employer].  He feels resentment because he is performing the majority of the labor and people are taking advantage of his good nature.  At that time, my plan was: Patient did work in areas potentially prone to TB.  However he denies any hemoptysis.  He denies any cough.  He denies any shortness of breath.  At the present time I will not screen for tuberculosis.  He denies true fever of unknown origin.  Differential diagnosis includes kidney infections, malignancy, autoimmune diseases.  Other possibilities would be obstructive sleep apnea causing him to wake up soaked in sweat or hormone deficiency such as testosterone deficiencies or hyperthyroidism.  However after talking with the patient, knowing the depths of his religious conviction, I feel that this may be related to stress.   Therefore I will check a CBC, CMP, TSH, CRP, and testosterone level.  If all of this is normal, I will discussed with him trying something at night for insomnia and stress.  Consider a sleep study if persistent as well as a workup for fever of unknown origin  06/20/17 Please see documentation in the chart.  Patient continued to experience episodes of waking from a deep sleep and sheer panic.  At times he would feel tachycardia.  He felt anxious the majority of the time.  He was unable to sleep.  I believe the patient was having panic attacks.  Therefore I empirically started him on Lexapro.  However I also ordered a Holter monitor to evaluate for cardiac arrhythmias.  In the meantime, the patient started the Lexapro and also resigned his position from his church.  This was a positive aspect in his life as it removed a tremendous amount of stress and pressure from him.  He is still active in the church but he has offloaded certain responsibilities.  Since that time, he is sleeping calmly through the night.  He has had no further panic attacks.  He feels much better.  In fact he states he feels better now than he ever has.  He denies any further episodes of tachycardia.  However on the Holter monitor, the patient was having frequent occasional PVCs that were isolated.  He also had at least 3 separate runs  of SVT, one that was 10 beats, one that was 7 beats, and 2 that were 4 beats.  Patient denies any syncope or near syncope or dizziness or chest pain or shortness of breath   Immunization History  Administered Date(s) Administered  . Influenza Split 10/06/2012  . Influenza, High Dose Seasonal PF 12/02/2016  . Influenza,inj,Quad PF,6+ Mos 11/12/2013, 11/11/2014, 10/20/2015  . Pneumococcal Conjugate-13 03/17/2013  . Pneumococcal Polysaccharide-23 02/05/2001  . Td 03/01/2009  . Zoster 02/25/2008    Past Medical History:  Diagnosis Date  . Allergy   . BPH (benign prostatic hypertrophy)   . CAD  (coronary artery disease)    s/p cypher DES to pLAD 6/08; normal LVF;  ETT-Myoview 2009: no ischemia   . Eosinophilia   . HTN (hypertension)   . Hyperlipidemia   . Myocardial infarction ([place])   . Prediabetes    Past Surgical History:  Procedure Laterality Date  . APPENDECTOMY    . CARDIAC CATHETERIZATION  07/30/2006   CORONARY ANGIOPLASTY WITH STENT PLACEMENT  . EXPLORATORY LAPAROTOMY     age 57   Current Outpatient Medications on File Prior to Visit  Medication Sig Dispense Refill  . ADVAIR DISKUS 250-50 MCG/DOSE AEPB INHALE 1 PUFF INTO THE LUNGS 2 (TWO) TIMES DAILY. 60 each 5  . amLODipine-benazepril (LOTREL) 5-20 MG capsule TAKE ONE CAPSULE TWICE A DAY 180 capsule 2  . aspirin EC 81 MG tablet Take 1 tablet (81 mg total) by mouth daily.    Marland Kitchen atorvastatin (LIPITOR) 20 MG tablet Take 20 mg by mouth daily.    . Calcium Carbonate-Vitamin D (CALCARB 600/D) 600-400 MG-UNIT per tablet Take 1 tablet by mouth daily.     . Coenzyme Q-10 100 MG capsule Take 100 mg by mouth daily.    Marland Kitchen doxazosin (CARDURA) 2 MG tablet TAKE 1 TABLET (2 MG TOTAL) BY MOUTH DAILY. 90 tablet 3  . escitalopram (LEXAPRO) 10 MG tablet TAKE 1 TABLET BY MOUTH EVERY DAY 90 tablet 2  . fluticasone (FLONASE) 50 MCG/ACT nasal spray USE 2 SPRAYS INTO BOTH NOSTRILS DAILY. 48 g 3  . Multiple Vitamin (MULTIVITAMIN) capsule Take 1 capsule by mouth daily. Resetigen-D  Cellular rejuvenation formula    . NON FORMULARY Take 1 tablet by mouth 2 (two) times daily. Omega Q 1 tab twice daily     . traMADol (ULTRAM) 50 MG tablet TAKE 1 TABLET EVERY 6 HOURS AS NEEDED 60 tablet 0   No current facility-administered medications on file prior to visit.    No Known Allergies Social History   Socioeconomic History  . Marital status: Married    Spouse name: [name]  . Number of children: 1  . Years of education: Not on file  . Highest education level: Not on file  Occupational History  . Occupation: retired professor    Comment: History    . Occupation: missionary  Social Needs  . Financial resource strain: Not on file  . Food insecurity:    Worry: Not on file    Inability: Not on file  . Transportation needs:    Medical: Not on file    Non-medical: Not on file  Tobacco Use  . Smoking status: Former Research scientist (life sciences)  . Smokeless tobacco: Never Used  . Tobacco comment: quit over 19yrs ago.  Substance and Sexual Activity  . Alcohol use: Yes    Alcohol/week: 2.4 - 3.6 oz    Types: 4 - 6 Glasses of wine per week  . Drug use: No  .  Sexual activity: Yes    Partners: Female  Lifestyle  . Physical activity:    Days per week: Not on file    Minutes per session: Not on file  . Stress: Not on file  Relationships  . Social connections:    Talks on phone: Not on file    Gets together: Not on file    Attends religious service: Not on file    Active member of club or organization: Not on file    Attends meetings of clubs or organizations: Not on file    Relationship status: Not on file  . Intimate partner violence:    Fear of current or ex partner: Not on file    Emotionally abused: Not on file    Physically abused: Not on file    Forced sexual activity: Not on file  Other Topics Concern  . Not on file  Social History Narrative   Lives with his wife (second marriage in 2015, first marriage ended when his wife died alzheimer's disease). Since his remarriage, his adult daughter doesn't speak with him.   Family History  Problem Relation Age of Onset  . Coronary artery disease Father 83  . Coronary artery disease Brother   . Aortic aneurysm Brother   . Colon cancer Brother   . Stomach cancer Mother   . Esophageal cancer Neg Hx   . Rectal cancer Neg Hx   . Arthritis Daughter        rheumatoid     Review of Systems  All other systems reviewed and are negative.      Objective:   Physical Exam  Constitutional: He is oriented to person, place, and time. He appears well-developed and well-nourished. No distress.  HENT:    Head: Normocephalic and atraumatic.  Right Ear: External ear normal.  Left Ear: External ear normal.  Nose: Nose normal.  Mouth/Throat: Oropharynx is clear and moist. No oropharyngeal exudate.  Eyes: Pupils are equal, round, and reactive to light. Conjunctivae and EOM are normal. Right eye exhibits no discharge. Left eye exhibits no discharge. No scleral icterus.  Neck: Normal range of motion. Neck supple. No JVD present. No tracheal deviation present. No thyromegaly present.  Cardiovascular: Normal rate, regular rhythm, normal heart sounds and intact distal pulses. Exam reveals no gallop and no friction rub.  No murmur heard. Pulmonary/Chest: Effort normal and breath sounds normal. No stridor. No respiratory distress. He has no wheezes. He has no rales. He exhibits no tenderness.  Abdominal: Soft. Bowel sounds are normal. He exhibits no distension and no mass. There is no tenderness. There is no rebound and no guarding.  Musculoskeletal: Normal range of motion. He exhibits no edema or tenderness.  Lymphadenopathy:    He has no cervical adenopathy.  Neurological: He is alert and oriented to person, place, and time. He has normal reflexes. No cranial nerve deficit. He exhibits normal muscle tone. Coordination normal.  Skin: Skin is warm. No rash noted. He is not diaphoretic. No erythema. No pallor.  Psychiatric: He has a normal mood and affect. His behavior is normal. Judgment and thought content normal.  Vitals reviewed.         Assessment & Plan:  SVT (supraventricular tachycardia) (HCC)  Situational anxiety   Situational anxiety, generalized anxiety, and panic attacks and insomnia have improved on the Lexapro along with the decreased level of stress in his life.  I have recommended continuing Lexapro at least for the next 2 months and then consider weaning down  and discontinuing the medication.  Event monitor revealed episodes of SVT that spontaneously resolved after just a brief  moment.  We discussed his options including cardiology consultation, starting a low-dose beta-blocker, or clinical monitoring.  Patient elects to monitor himself clinically.  If he feels these episodes, we discussed Valsalva maneuvers and techniques to extinguish the SVT.  If an episode occurs that will not extinguish, he knows to go the emergency room for adenosine.  If they become frequent, we will start a beta-blocker and consult cardiology.  I am hesitant to start the beta-blocker today because of his relative bradycardia.

## 2017-07-10 ENCOUNTER — Encounter: Payer: Self-pay | Admitting: Family Medicine

## 2017-07-25 ENCOUNTER — Encounter: Payer: Self-pay | Admitting: Family Medicine

## 2017-08-23 ENCOUNTER — Other Ambulatory Visit: Payer: Self-pay | Admitting: Family Medicine

## 2017-10-10 ENCOUNTER — Encounter: Payer: Self-pay | Admitting: Family Medicine

## 2017-10-10 ENCOUNTER — Ambulatory Visit (INDEPENDENT_AMBULATORY_CARE_PROVIDER_SITE_OTHER): Payer: PPO | Admitting: Family Medicine

## 2017-10-10 ENCOUNTER — Other Ambulatory Visit: Payer: Self-pay

## 2017-10-10 VITALS — BP 128/68 | HR 82 | Temp 98.1°F | Resp 14 | Ht 67.0 in | Wt 203.0 lb

## 2017-10-10 DIAGNOSIS — M26621 Arthralgia of right temporomandibular joint: Secondary | ICD-10-CM

## 2017-10-10 MED ORDER — PREDNISONE 20 MG PO TABS: 40.0000 mg | ORAL_TABLET | Freq: Every day | ORAL | 0 refills | Status: AC

## 2017-10-10 NOTE — Progress Notes (Signed)
Patient ID: [name], male    DOB: 1933/03/27, 84 y.o.   MRN: [id number]  PCP: [name], MD  Chief Complaint  Patient presents with  . R Sided Jaw Pain    x1 week- states that he was cracking a nut between his teeth and jaw dislocated- now has pain in R side of face, jaw    Subjective:   [name] is a 84 y.o. male, presents to clinic with CC of 1 week of right jaw pain. Patient had acute onset of right jaw pain after cracking up between his teeth and feeling that his jaw dislocated, has been constant and achy since then with intermittent sharp shooting pains that are gradually worsening.  This pain radiates into his ear, to his temple cheeks and jaw on the right side of his face.  Pain is worsened with palpation to the TMJ or right in front of his ear and exacerbated with chewing and opening his mouth.  He has had no deformity, lockjaw, popping.  He denies any swelling, redness or lymphadenopathy.  3 weeks ago he describes sensation of skin crawling to the right side of his face in the same distribution of his current pain, research this and decided it was related to nerve problems.  Only after biting down the neck with his right upper and lower molars today have onset of pain to the right side of his face jaw and ear.  He has not taken anything for the pain.  He denies any difficulty chewing swallowing speaking.  He denies any facial droop, slurred speech, headache, neck pain or stiffness.  Any fever, chills, sweats, nausea, vomiting, visual disturbances, redness, swelling on his face.    He reports going to his dentist recently and having normal radiographs of his mouth and jaw.  Denies any dental injury or dental pain currently.   Patient Active Problem List   Diagnosis Date Noted  . Prediabetes   . Eosinophilia   . Need for prophylactic vaccination and inoculation against influenza 11/12/2013  . Nocturnal leg cramps 11/23/2012  . Pain in joint, shoulder  region 11/23/2012  . Fall 11/04/2012  . Hematoma 11/04/2012  . Rib pain 11/04/2012  . Acute upper respiratory infections of unspecified site 11/04/2012  . HYPERLIPIDEMIA TYPE IIB / III 03/08/2008  . HYPERTENSION, BENIGN 03/08/2008  . CAD, NATIVE VESSEL 03/08/2008     Prior to Admission medications   Medication Sig Start Date End Date Taking? Authorizing Provider  ADVAIR DISKUS 250-50 MCG/DOSE AEPB INHALE 1 PUFF INTO THE LUNGS 2 (TWO) TIMES DAILY. 11/26/14  Yes [name], MD  amLODipine-benazepril (LOTREL) 5-20 MG capsule TAKE ONE CAPSULE TWICE A DAY 02/14/17  Yes Sherren Mocha, MD  aspirin EC 81 MG tablet Take 1 tablet (81 mg total) by mouth daily. 02/19/13  Yes Weaver, Scott T, PA-C  atorvastatin (LIPITOR) 20 MG tablet Take 20 mg by mouth daily.   Yes [provider]  Calcium Carbonate-Vitamin D (CALCARB 600/D) 600-400 MG-UNIT per tablet Take 1 tablet by mouth daily.    Yes [provider]  Coenzyme Q-10 100 MG capsule Take 100 mg by mouth daily.   Yes [provider]  doxazosin (CARDURA) 2 MG tablet TAKE 1 TABLET BY MOUTH EVERY DAY 08/23/17  Yes [name], MD  escitalopram (LEXAPRO) 10 MG tablet TAKE 1 TABLET BY MOUTH EVERY DAY 06/12/17  Yes [name], MD  fluticasone (FLONASE) 50 MCG/ACT nasal spray USE 2  SPRAYS INTO BOTH NOSTRILS DAILY. 02/26/17  Yes [name], MD  NON FORMULARY Take 1 tablet by mouth 2 (two) times daily. Omega Q 1 tab twice daily    Yes [provider]  traMADol (ULTRAM) 50 MG tablet TAKE 1 TABLET EVERY 6 HOURS AS NEEDED 04/18/17  Yes [name], MD     No Known Allergies   Family History  Problem Relation Age of Onset  . Coronary artery disease Father 75  . Coronary artery disease Brother   . Aortic aneurysm Brother   . Colon cancer Brother   . Stomach cancer Mother   . Esophageal cancer Neg Hx   . Rectal cancer Neg Hx   . Arthritis Daughter        rheumatoid     Social History    Socioeconomic History  . Marital status: Married    Spouse name: [name]  . Number of children: 1  . Years of education: Not on file  . Highest education level: Not on file  Occupational History  . Occupation: retired professor    Comment: History   . Occupation: missionary  Social Needs  . Financial resource strain: Not on file  . Food insecurity:    Worry: Not on file    Inability: Not on file  . Transportation needs:    Medical: Not on file    Non-medical: Not on file  Tobacco Use  . Smoking status: Former Research scientist (life sciences)  . Smokeless tobacco: Never Used  . Tobacco comment: quit over 40yrs ago.  Substance and Sexual Activity  . Alcohol use: Yes    Alcohol/week: 4.0 - 6.0 standard drinks    Types: 4 - 6 Glasses of wine per week  . Drug use: No  . Sexual activity: Yes    Partners: Female  Lifestyle  . Physical activity:    Days per week: Not on file    Minutes per session: Not on file  . Stress: Not on file  Relationships  . Social connections:    Talks on phone: Not on file    Gets together: Not on file    Attends religious service: Not on file    Active member of club or organization: Not on file    Attends meetings of clubs or organizations: Not on file    Relationship status: Not on file  . Intimate partner violence:    Fear of current or ex partner: Not on file    Emotionally abused: Not on file    Physically abused: Not on file    Forced sexual activity: Not on file  Other Topics Concern  . Not on file  Social History Narrative   Lives with his wife (second marriage in 2015, first marriage ended when his wife died alzheimer's disease). Since his remarriage, his adult daughter doesn't speak with him.     Review of Systems  Constitutional: Negative.  Negative for activity change, appetite change, chills, diaphoresis, fatigue, fever and unexpected weight change.  HENT: Positive for ear pain. Negative for congestion, ear discharge, facial swelling, hearing loss,  mouth sores, nosebleeds, postnasal drip, sinus pressure, sinus pain, sore throat, tinnitus, trouble swallowing and voice change.   Eyes: Negative.  Negative for photophobia, pain, discharge, redness and visual disturbance.  Respiratory: Negative.   Cardiovascular: Negative.   Gastrointestinal: Negative.   Endocrine: Negative.   Musculoskeletal: Negative for neck pain and neck stiffness.  Skin: Negative.  Negative for rash.  Neurological: Negative for dizziness, syncope, facial  asymmetry, speech difficulty, weakness, numbness and headaches.  Hematological: Negative.   Psychiatric/Behavioral: Negative.   All other systems reviewed and are negative.      Objective:    Vitals:   10/10/17 1208  BP: 128/68  Pulse: 82  Resp: 14  Temp: 98.1 F (36.7 C)  TempSrc: Oral  SpO2: 98%  Weight: 203 lb (92.1 kg)  Height: 5\' 7"  (1.702 m)      Physical Exam  Constitutional: He appears well-developed and well-nourished. No distress.  HENT:  Head: Normocephalic and atraumatic. Head is without raccoon's eyes, without Battle's sign, without abrasion, without contusion and without right periorbital erythema. Hair is normal.  Right Ear: Hearing, tympanic membrane, external ear and ear canal normal. No drainage, swelling or tenderness. No mastoid tenderness. No middle ear effusion. No decreased hearing is noted.  Left Ear: Hearing, tympanic membrane, external ear and ear canal normal.  Nose: Nose normal. No mucosal edema or rhinorrhea. Right sinus exhibits no maxillary sinus tenderness and no frontal sinus tenderness. Left sinus exhibits no maxillary sinus tenderness and no frontal sinus tenderness.  Mouth/Throat: Uvula is midline, oropharynx is clear and moist and mucous membranes are normal. Mucous membranes are not pale, not dry and not cyanotic. No trismus in the jaw. No dental abscesses or uvula swelling. No tonsillar exudate.  Right TMJ ttp, no crepitus palpated, no deformity No facial ttp, no  temporal ttp No facial edema, erythema, rash Face symmetrical   Eyes: Pupils are equal, round, and reactive to light. Conjunctivae, EOM and lids are normal. Lids are everted and swept, no foreign bodies found. Right eye exhibits no chemosis and no discharge. Left eye exhibits no chemosis and no discharge. Right conjunctiva is not injected. Right conjunctiva has no hemorrhage. No scleral icterus. Right eye exhibits normal extraocular motion and no nystagmus. Left eye exhibits normal extraocular motion and no nystagmus. Right pupil is round and reactive. Left pupil is round and reactive. Pupils are equal.  Neck: Trachea normal, normal range of motion, full passive range of motion without pain and phonation normal. Neck supple. No spinous process tenderness and no muscular tenderness present. Carotid bruit is not present. No neck rigidity. No tracheal deviation, no edema, no erythema and normal range of motion present.  Cardiovascular: Normal rate and regular rhythm.  Pulmonary/Chest: Effort normal. No stridor. No respiratory distress.  Musculoskeletal: Normal range of motion.  Lymphadenopathy:       Head (right side): No submental, no submandibular, no tonsillar, no preauricular, no posterior auricular and no occipital adenopathy present.    He has no cervical adenopathy.  Neurological: He is alert. No cranial nerve deficit. He exhibits normal muscle tone. Coordination normal.  Skin: Skin is warm and dry. No rash noted. He is not diaphoretic.  Psychiatric: He has a normal mood and affect. His behavior is normal.  Nursing note and vitals reviewed.         Assessment & Plan:      ICD-10-CM   1. TMJ tenderness, right M26.621    TMJ dysfunction vs trigeminal neuralgia, pt had some vague sx 3 weeks ago and only pain after biting down on a nut and feeling like his jaw dislocated needs had pain intermittently for the past week.  He only has tenderness to palpation to the right TMJ, no other  tenderness to palpation of his face, jaw, temple, ears and eye exam are normal.  No lymphadenopathy and no enlargement of the parotid gland.  He does not have any  focal neurological deficit, no neck stiffness, and no other signs of any infectious or inflammatory illness.  He is able to open and close his mouth, he does so carefully with slight grimace.  Will treat with soft foods, and resting of the jaw, short burst of steroids.  I have discussed with him possibly going to his dentist to evaluate his TMJ but he states that these were recently gone to him and had normal x-rays done, does not seem to want any imaging repeated by his dentist.  Feels most appropriate to allow another week treat with steroids and rest, can use NSAIDs and Tylenol after prednisone is completed, follow-up in 1 to 2 weeks for reevaluation, with any worsening I have discussed with the patient possibly obtaining lab work such as CBC and acute phase reactants.  If he continues to have no tenderness to palpation of his temple and face other than his jaw, would refer him to dentist with TMJ training or maxillofacial or oral surgeon with TMJ experience for further eval.    Delsa Grana, PA-C 10/10/17 12:22 PM

## 2017-10-10 NOTE — Patient Instructions (Signed)
Soft foods, steroids for 5 days, can use your pain medicine as needed.  If not improving in 1-2 weeks, give [place] a call so we can get you to a specialist.   Temporomandibular Joint Syndrome Temporomandibular joint (TMJ) syndrome is a condition that affects the joints between your jaw and your skull. The TMJs are located near your ears and allow your jaw to open and close. These joints and the nearby muscles are involved in all movements of the jaw. People with TMJ syndrome have pain in the area of these joints and muscles. Chewing, biting, or other movements of the jaw can be difficult or painful. TMJ syndrome can be caused by various things. In many cases, the condition is mild and goes away within a few weeks. For some people, the condition can become a long-term problem. What are the causes? Possible causes of TMJ syndrome include:  Grinding your teeth or clenching your jaw. Some people do this when they are under stress.  Arthritis.  Injury to the jaw.  Head or neck injury.  Teeth or dentures that are not aligned well.  In some cases, the cause of TMJ syndrome may not be known. What are the signs or symptoms? The most common symptom is an aching pain on the side of the head in the area of the TMJ. Other symptoms may include:  Pain when moving your jaw, such as when chewing or biting.  Being unable to open your jaw all the way.  Making a clicking sound when you open your mouth.  Headache.  Earache.  Neck or shoulder pain.  How is this diagnosed? Diagnosis can usually be made based on your symptoms, your medical history, and a physical exam. Your health care provider may check the range of motion of your jaw. Imaging tests, such as X-rays or an MRI, are sometimes done. You may need to see your dentist to determine if your teeth and jaw are lined up correctly. How is this treated? TMJ syndrome often goes away on its own. If treatment is needed, the options may  include:  Eating soft foods and applying ice or heat.  Medicines to relieve pain or inflammation.  Medicines to relax the muscles.  A splint, bite plate, or mouthpiece to prevent teeth grinding or jaw clenching.  Relaxation techniques or counseling to help reduce stress.  Transcutaneous electrical nerve stimulation (TENS). This helps to relieve pain by applying an electrical current through the skin.  Acupuncture. This is sometimes helpful to relieve pain.  Jaw surgery. This is rarely needed.  Follow these instructions at home:  Take medicines only as directed by your health care provider.  Eat a soft diet if you are having trouble chewing.  Apply ice to the painful area. ? Put ice in a plastic bag. ? Place a towel between your skin and the bag. ? Leave the ice on for 20 minutes, 2-3 times a day.  Apply a warm compress to the painful area as directed.  Massage your jaw area and perform any jaw stretching exercises as recommended by your health care provider.  If you were given a mouthpiece or bite plate, wear it as directed.  Avoid foods that require a lot of chewing. Do not chew gum.  Keep all follow-up visits as directed by your health care provider. This is important. Contact a health care provider if:  You are having trouble eating.  You have new or worsening symptoms. Get help right away if:  Your jaw  locks open or closed. This information is not intended to replace advice given to you by your health care provider. Make sure you discuss any questions you have with your health care provider. Document Released: 10/17/2000 Document Revised: 09/22/2015 Document Reviewed: 08/27/2013 Elsevier Interactive Patient Education  [employer].

## 2017-10-14 ENCOUNTER — Ambulatory Visit: Payer: PPO | Admitting: Family Medicine

## 2017-10-17 ENCOUNTER — Telehealth: Payer: Self-pay | Admitting: Family Medicine

## 2017-10-17 NOTE — Telephone Encounter (Signed)
Patient is calling to let you know he is doing much better!

## 2017-10-18 ENCOUNTER — Telehealth: Payer: Self-pay | Admitting: Family Medicine

## 2017-10-18 DIAGNOSIS — M26621 Arthralgia of right temporomandibular joint: Secondary | ICD-10-CM

## 2017-10-18 MED ORDER — PREDNISONE 20 MG PO TABS: ORAL_TABLET | ORAL | 0 refills | Status: DC

## 2017-10-18 NOTE — Telephone Encounter (Signed)
Pt called and states that he finished his pred 2 days ago and was doing great and today his pain has gotten worse again and wants to know if he could have another rx for prednisone as the knocked it out fast.

## 2017-10-18 NOTE — Telephone Encounter (Signed)
I have reviewed pt's labs from April this year, he has CKD stage 4, I would have liked to put him on NSAIDs for TMJ, but cannot due to his renal function.  He also has prediabetes.  I do not believe that more steroids will "knock it out" like he hopes it will.  We could try a lower dose taper which may help with inflammation.  We did discuss the plan at his OV that he may need imaging or evaluation by a dentist or specialist if pain did not improve.    Pt Xray of jaw ordered and prednisone.  Will need f/up if not improved in 1-2 weeks, pt advised to keep icing, resting jaw by eating soft foods.

## 2017-10-21 ENCOUNTER — Emergency Department (HOSPITAL_COMMUNITY): Admission: EM | Admit: 2017-10-21 | Payer: PPO | Source: Home / Self Care

## 2017-10-21 ENCOUNTER — Ambulatory Visit (HOSPITAL_COMMUNITY)
Admission: RE | Admit: 2017-10-21 | Discharge: 2017-10-21 | Disposition: A | Discharge status: Home / Self Care | Payer: PPO | Source: Ambulatory Visit | Attending: Family Medicine | Admitting: Family Medicine

## 2017-10-21 ENCOUNTER — Other Ambulatory Visit: Payer: Self-pay

## 2017-10-21 DIAGNOSIS — M26621 Arthralgia of right temporomandibular joint: Secondary | ICD-10-CM | POA: Insufficient documentation

## 2017-10-21 DIAGNOSIS — R6884 Jaw pain: Secondary | ICD-10-CM | POA: Diagnosis not present

## 2017-10-21 IMAGING — DX DG TMJ OPEN & CLOSE BILAT
4 series · 4 of 4 positions shown · non-contrast
Comparison: None.

CLINICAL DATA: Sudden onset of right jaw pain

EXAM:
TEMPOROMANDIBULAR JOINTS

[t skull lat (1 of 4)]
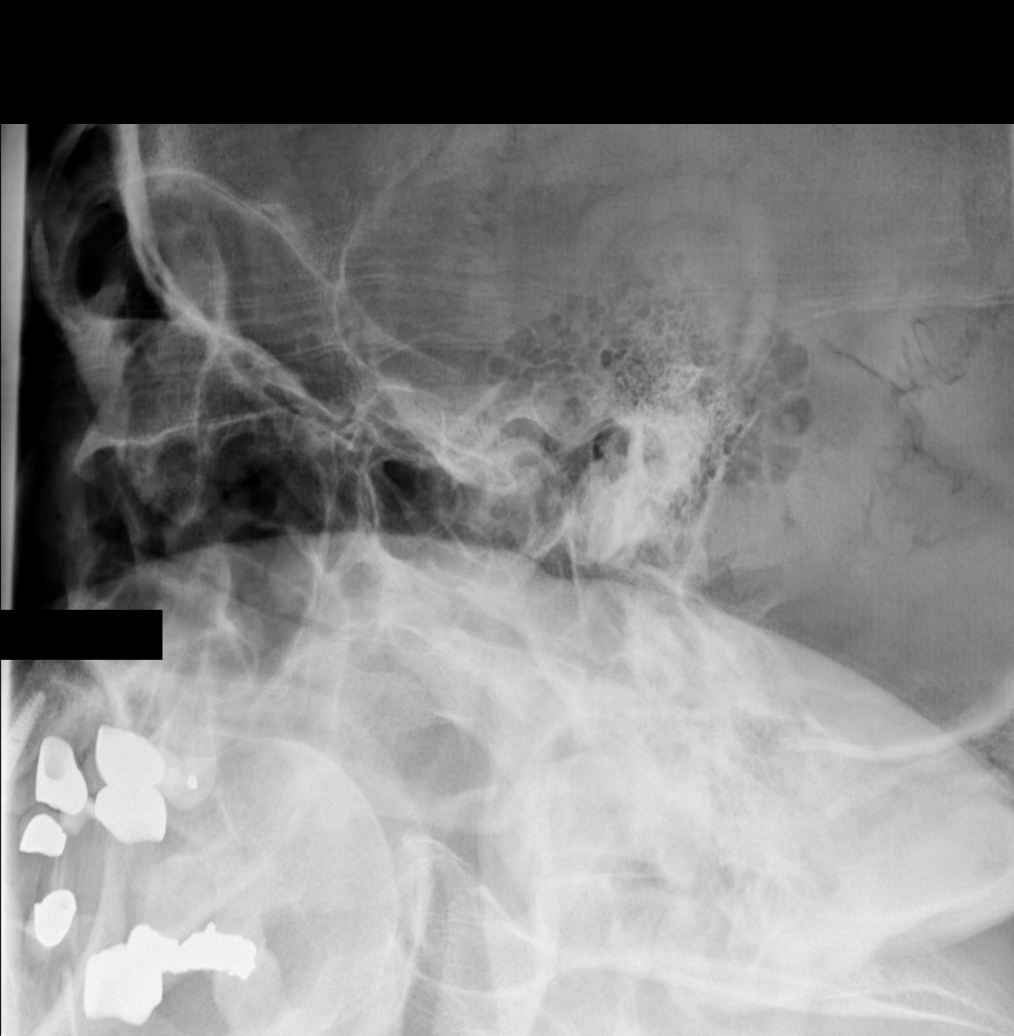

[t skull lat (2 of 4)]
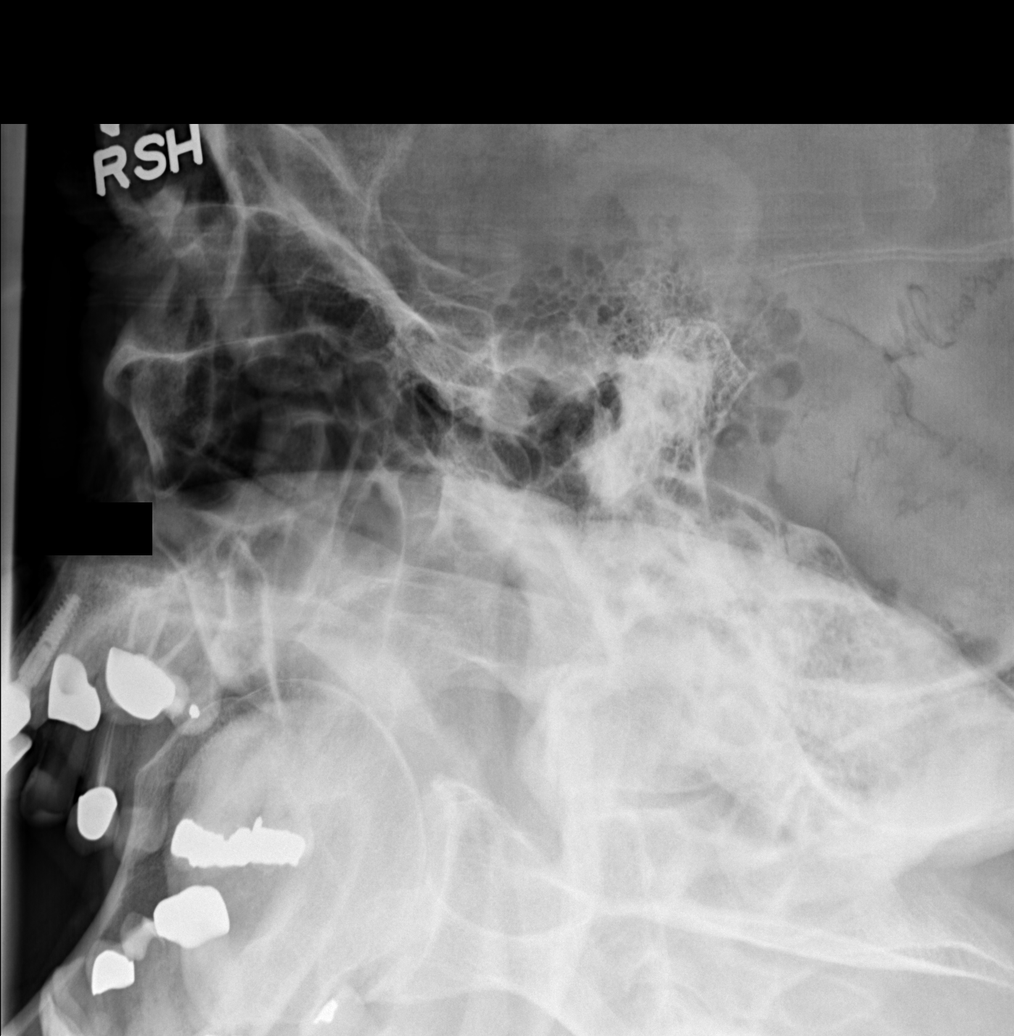

[t skull lat (3 of 4)]
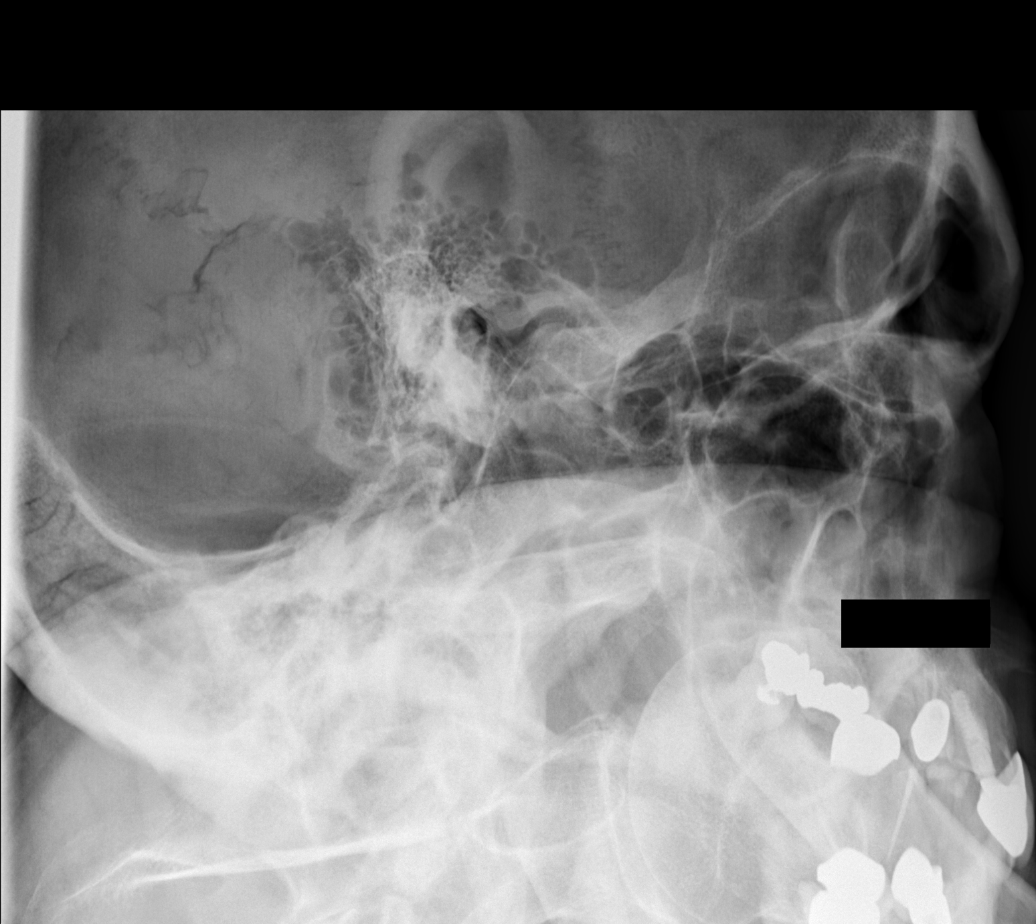

[t skull lat (4 of 4)]
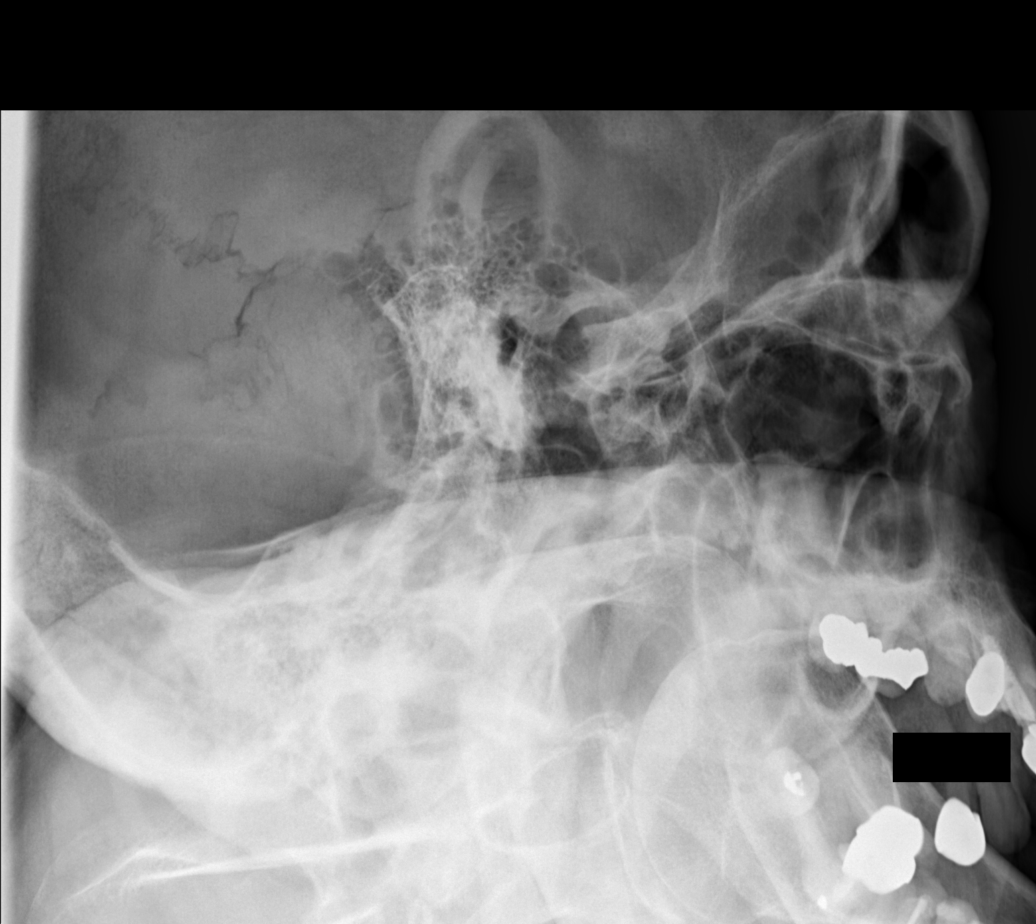

[4 of 4 positions shown; findings below may reference images not displayed]

FINDINGS: Both temporomandibular joints are located with the mouth closed. On
opening the mouth, there is normal physiologic forward translation
of the mandibular heads. No dislocation.
IMPRESSION: No evidence of TMJ dislocation.

## 2017-10-22 NOTE — Telephone Encounter (Signed)
He can f/up PRN for OV. If pain returns when steroids are finished, he does not have to return to clinic in order to be referred for PT or specialty eval.  He can just let us know over the phone if that is what he would like to do if pain returns.  Sorry for the miscommunication

## 2017-10-22 NOTE — Telephone Encounter (Signed)
Do you want me to call him now to see if he needs OV or wait 1-2 weeks as stated in plan?  2 different things noted about same issue.

## 2017-10-24 NOTE — Telephone Encounter (Signed)
Call placed to patient and patient made aware.   States that he is feeling much improved at this time. Reports that he will contact office if pain returns,.

## 2017-11-08 ENCOUNTER — Ambulatory Visit: Payer: PPO | Admitting: Cardiovascular Disease

## 2017-11-08 ENCOUNTER — Encounter: Payer: Self-pay | Admitting: Cardiovascular Disease

## 2017-11-08 VITALS — BP 130/62 | HR 59 | Ht 67.0 in | Wt 203.8 lb

## 2017-11-08 DIAGNOSIS — E782 Mixed hyperlipidemia: Secondary | ICD-10-CM | POA: Diagnosis not present

## 2017-11-08 DIAGNOSIS — R0989 Other specified symptoms and signs involving the circulatory and respiratory systems: Secondary | ICD-10-CM | POA: Diagnosis not present

## 2017-11-08 DIAGNOSIS — I2510 Atherosclerotic heart disease of native coronary artery without angina pectoris: Secondary | ICD-10-CM

## 2017-11-08 DIAGNOSIS — I10 Essential (primary) hypertension: Secondary | ICD-10-CM | POA: Diagnosis not present

## 2017-11-08 NOTE — Patient Instructions (Signed)
Medication Instructions:  Your provider recommends that you continue on your current medications as directed. Please refer to the Current Medication list given to you today.    Labwork: None  Testing/Procedures: Your physician has requested that you have a carotid duplex. This test is an ultrasound of the carotid arteries in your neck. It looks at blood flow through these arteries that supply the brain with blood. Allow one hour for this exam. There are no restrictions or special instructions.  Follow-Up: Your provider wants you to follow-up in: 1 year with Dr. Cooper. You will receive a reminder letter in the mail two months in advance. If you don't receive a letter, please call our office to schedule the follow-up appointment.    Any Other Special Instructions Will Be Listed Below (If Applicable).     If you need a refill on your cardiac medications before your next appointment, please call your pharmacy.   

## 2017-11-08 NOTE — Progress Notes (Signed)
Cardiology Office Note:    Date:  11/08/2017   ID:  [name], DOB 06-10-1933, MRN [id number]  PCP:  [name], MD  Cardiologist:  Sherren Mocha, MD  Electrophysiologist:  None   Referring MD: [name], MD   Chief Complaint  Patient presents with  . Follow-up    CAD    History of Present Illness:    [name] is a 84 y.o. male with a hx of coronary artery disease, hypertension, and hyperlipidemia.  The patient underwent PCI of the proximal LAD in 2008 with a Cypher drug-eluting stent.  At that time he was noted to have moderate residual coronary disease with medical therapy recommended.  He has done well since then with no recurrent ischemic events.  The patient is here alone today.  He is doing very well.  He is quite busy with all of his volunteer activities through his church.  He continues to teach scripture.  He has not been engaged in routine exercise over the past few months.  He denies any chest pain, chest pressure, or shortness of breath.  He has no heart palpitations.  Overall he feels well.  There have been no changes in his medical regimen.  Past Medical History:  Diagnosis Date  . Allergy   . BPH (benign prostatic hypertrophy)   . CAD (coronary artery disease)    s/p cypher DES to pLAD 6/08; normal LVF;  ETT-Myoview 2009: no ischemia   . Eosinophilia   . HTN (hypertension)   . Hyperlipidemia   . Myocardial infarction ([place])   . Prediabetes     Past Surgical History:  Procedure Laterality Date  . APPENDECTOMY    . CARDIAC CATHETERIZATION  07/30/2006   CORONARY ANGIOPLASTY WITH STENT PLACEMENT  . EXPLORATORY LAPAROTOMY     age 32    Current Medications: Current Meds  Medication Sig  . ADVAIR DISKUS 250-50 MCG/DOSE AEPB INHALE 1 PUFF INTO THE LUNGS 2 (TWO) TIMES DAILY.  Marland Kitchen amLODipine-benazepril (LOTREL) 5-20 MG capsule TAKE ONE CAPSULE TWICE A DAY  . aspirin EC 81 MG tablet Take 1 tablet (81 mg total) by mouth daily.  Marland Kitchen  atorvastatin (LIPITOR) 20 MG tablet Take 20 mg by mouth daily.  . Calcium Carbonate-Vitamin D (CALCARB 600/D) 600-400 MG-UNIT per tablet Take 1 tablet by mouth daily.   . Coenzyme Q-10 100 MG capsule Take 100 mg by mouth daily.  Marland Kitchen doxazosin (CARDURA) 2 MG tablet TAKE 1 TABLET BY MOUTH EVERY DAY  . escitalopram (LEXAPRO) 10 MG tablet TAKE 1 TABLET BY MOUTH EVERY DAY  . fluticasone (FLONASE) 50 MCG/ACT nasal spray USE 2 SPRAYS INTO BOTH NOSTRILS DAILY.  . NON FORMULARY Take 1 tablet by mouth 2 (two) times daily. Omega Q 1 tab twice daily   . traMADol (ULTRAM) 50 MG tablet TAKE 1 TABLET EVERY 6 HOURS AS NEEDED     Allergies:   Patient has no known allergies.   Social History   Socioeconomic History  . Marital status: Married    Spouse name: [name]  . Number of children: 1  . Years of education: Not on file  . Highest education level: Not on file  Occupational History  . Occupation: retired professor    Comment: History   . Occupation: missionary  Social Needs  . Financial resource strain: Not on file  . Food insecurity:    Worry: Not on file    Inability: Not on file  . Transportation needs:  Medical: Not on file    Non-medical: Not on file  Tobacco Use  . Smoking status: Former Research scientist (life sciences)  . Smokeless tobacco: Never Used  . Tobacco comment: quit over 9yrs ago.  Substance and Sexual Activity  . Alcohol use: Yes    Alcohol/week: 4.0 - 6.0 standard drinks    Types: 4 - 6 Glasses of wine per week  . Drug use: No  . Sexual activity: Yes    Partners: Female  Lifestyle  . Physical activity:    Days per week: Not on file    Minutes per session: Not on file  . Stress: Not on file  Relationships  . Social connections:    Talks on phone: Not on file    Gets together: Not on file    Attends religious service: Not on file    Active member of club or organization: Not on file    Attends meetings of clubs or organizations: Not on file    Relationship status: Not on file  Other  Topics Concern  . Not on file  Social History Narrative   Lives with his wife (second marriage in 2015, first marriage ended when his wife died alzheimer's disease). Since his remarriage, his adult daughter doesn't speak with him.     Family History: The patient's family history includes Aortic aneurysm in his brother; Arthritis in his daughter; Colon cancer in his brother; Coronary artery disease in his brother; Coronary artery disease (age of onset: 35) in his father; Stomach cancer in his mother. There is no history of Esophageal cancer or Rectal cancer.  ROS:   Please see the history of present illness.    Positive for back pain and dizziness.  All other systems reviewed and are negative.  EKGs/Labs/Other Studies Reviewed:    EKG:  EKG is ordered today.  The ekg ordered today demonstrates normal sinus rhythm 59 bpm, PAC, left axis deviation.  Recent Labs: 05/06/2017: ALT 20; BUN 24; Creat 1.69; Hemoglobin 12.6; Platelets 282; Potassium 5.1; Sodium 140; TSH 0.94  Recent Lipid Panel    Component Value Date/Time   CHOL 136 04/09/2017 0804   TRIG 74 04/09/2017 0804   HDL 50 04/09/2017 0804   CHOLHDL 2.7 04/09/2017 0804   VLDL 8 03/29/2016 0805   LDLCALC 71 04/09/2017 0804    Physical Exam:    VS:  BP 130/62   Pulse (!) 59   Ht 5\' 7"  (1.702 m)   Wt 203 lb 12.8 oz (92.4 kg)   SpO2 97%   BMI 31.92 kg/m     Wt Readings from Last 3 Encounters:  11/08/17 203 lb 12.8 oz (92.4 kg)  10/10/17 203 lb (92.1 kg)  06/20/17 205 lb (93 kg)     GEN: Well nourished, well developed in no acute distress HEENT: Normal NECK: No JVD; there is a right carotid bruit LYMPHATICS: No lymphadenopathy CARDIAC: RRR, no murmurs, rubs, gallops RESPIRATORY:  Clear to auscultation without rales, wheezing or rhonchi  ABDOMEN: Soft, non-tender, non-distended MUSCULOSKELETAL: There is trace bilateral pretibial edema; No deformity  SKIN: Warm and dry NEUROLOGIC:  Alert and oriented x 3 PSYCHIATRIC:   Normal affect   ASSESSMENT:    1. Coronary artery disease involving native coronary artery of native heart without angina pectoris   2. Essential hypertension   3. Mixed hyperlipidemia   4. Right carotid bruit    PLAN:    In order of problems listed above:  1. Stable without symptoms of angina.  Medical program  reviewed and he is treated with low-dose aspirin, and ACE inhibitor, and a statin drug.  No changes are made today. 2. Blood pressure remains well controlled.  Continue current therapy.  Patient has stage III chronic kidney disease and his creatinine trend is stable over the last 2 years.  He continues on an ACE inhibitor. 3. Recent lipids reviewed from March 2019 demonstrating a cholesterol of 136, HDL 50, LDL 71, and triglycerides 74 4. The patient has no neurologic symptoms.  He is on appropriate medical therapy.  We will check a carotid duplex to rule out any high-grade stenosis.  Medication Adjustments/Labs and Tests Ordered: Current medicines are reviewed at length with the patient today.  Concerns regarding medicines are outlined above.  Orders Placed This Encounter  Procedures  . EKG 12-Lead   No orders of the defined types were placed in this encounter.   Patient Instructions  Medication Instructions:  Your provider recommends that you continue on your current medications as directed. Please refer to the Current Medication list given to you today.    Labwork: None  Testing/Procedures: Your physician has requested that you have a carotid duplex. This test is an ultrasound of the carotid arteries in your neck. It looks at blood flow through these arteries that supply the brain with blood. Allow one hour for this exam. There are no restrictions or special instructions.  Follow-Up: Your provider wants you to follow-up in: 1 year with Dr. [name]. You will receive a reminder letter in the mail two months in advance. If you don't receive a letter, please call our office  to schedule the follow-up appointment.    Any Other Special Instructions Will Be Listed Below (If Applicable).     If you need a refill on your cardiac medications before your next appointment, please call your pharmacy.      Signed, Sherren Mocha, MD  11/08/2017 1:16 PM    [facility]

## 2017-11-12 ENCOUNTER — Ambulatory Visit (HOSPITAL_COMMUNITY)
Admission: RE | Admit: 2017-11-12 | Discharge: 2017-11-12 | Disposition: A | Discharge status: Home / Self Care | Payer: PPO | Source: Ambulatory Visit | Attending: Cardiovascular Disease | Admitting: Cardiovascular Disease

## 2017-11-12 DIAGNOSIS — R0989 Other specified symptoms and signs involving the circulatory and respiratory systems: Secondary | ICD-10-CM | POA: Diagnosis not present

## 2017-11-19 ENCOUNTER — Telehealth: Payer: Self-pay | Admitting: Cardiovascular Disease

## 2017-11-19 NOTE — Telephone Encounter (Signed)
Informed patient he will be called once Dr. [name] reviews results. He was grateful for call and agrees with treatment plan.

## 2017-11-19 NOTE — Telephone Encounter (Signed)
New message   Patient is needing results of his carotid artery test.

## 2017-11-22 ENCOUNTER — Telehealth: Payer: Self-pay

## 2017-11-22 DIAGNOSIS — I6529 Occlusion and stenosis of unspecified carotid artery: Secondary | ICD-10-CM

## 2017-11-22 NOTE — Telephone Encounter (Signed)
Informed patient of results and verbal understanding expressed.  Repeat study ordered to be scheduled in 1 year. Patient agrees with treatment plan. 

## 2017-11-22 NOTE — Telephone Encounter (Signed)
-----   Message from Sherren Mocha, MD sent at 11/22/2017  5:46 AM EDT ----- Study reviewed. Less than 40% stenosis noted. 12 month FU recommended secondary to plaque burden, but would continue medical therapy at this time.

## 2017-12-04 ENCOUNTER — Other Ambulatory Visit: Payer: Self-pay | Admitting: Family Medicine

## 2017-12-06 ENCOUNTER — Encounter

## 2017-12-11 DIAGNOSIS — G500 Trigeminal neuralgia: Secondary | ICD-10-CM | POA: Diagnosis not present

## 2017-12-19 ENCOUNTER — Ambulatory Visit (INDEPENDENT_AMBULATORY_CARE_PROVIDER_SITE_OTHER): Payer: PPO | Admitting: Family Medicine

## 2017-12-19 VITALS — BP 120/64 | HR 64 | Temp 98.4°F | Resp 16 | Ht 67.0 in | Wt 210.0 lb

## 2017-12-19 DIAGNOSIS — G500 Trigeminal neuralgia: Secondary | ICD-10-CM

## 2017-12-19 DIAGNOSIS — R55 Syncope and collapse: Secondary | ICD-10-CM | POA: Diagnosis not present

## 2017-12-19 MED ORDER — CARBAMAZEPINE ER 100 MG PO TB12: 100.0000 mg | ORAL_TABLET | Freq: Two times a day (BID) | ORAL | 1 refills | Status: DC

## 2017-12-19 NOTE — Progress Notes (Signed)
Subjective:    Patient ID: [name], male    DOB: [birth year], 84 y.o.   MRN: [id number]  HPI  05/06/17 patient is a very pleasant 84 year old Caucasian male who presents today with night sweats for the last 3 weeks.  He has been monitoring his temperature and has never recorded a fever.  The highest his temperature has recorded was 97.7.  He denies feeling sick or ill.  He denies any otalgia, sore throat, sinus pain, nausea vomiting or diarrhea.  He denies any cough or chest pain.  He denies any dysuria or hematuria.  He denies any bone pain other than normal aches and pains in his joints primarily on the right side.  He denies any weight loss, bruising, unexplained bleeding.  This only occurs at night.  It is isolated just to his chest wall.  The dampness of his shirt will awaken him from sleep.  However after awakening, the patient will have intrusive thoughts.  He is a Estate manager/land agent.  He believes that he is being confronted by demonic forces at night.  In that, I mean that the patient feels they are putting bad thoughts, self doubt, resentment, in his mind that plague him.  At the present time, he is working for [employer].  He feels resentment because he is performing the majority of the labor and people are taking advantage of his good nature.  At that time, my plan was: Patient did work in areas potentially prone to TB.  However he denies any hemoptysis.  He denies any cough.  He denies any shortness of breath.  At the present time I will not screen for tuberculosis.  He denies true fever of unknown origin.  Differential diagnosis includes kidney infections, malignancy, autoimmune diseases.  Other possibilities would be obstructive sleep apnea causing him to wake up soaked in sweat or hormone deficiency such as testosterone deficiencies or hyperthyroidism.  However after talking with the patient, knowing the depths of his religious conviction, I feel that this may be related to stress.   Therefore I will check a CBC, CMP, TSH, CRP, and testosterone level.  If all of this is normal, I will discussed with him trying something at night for insomnia and stress.  Consider a sleep study if persistent as well as a workup for fever of unknown origin  06/20/17 Please see documentation in the chart.  Patient continued to experience episodes of waking from a deep sleep and sheer panic.  At times he would feel tachycardia.  He felt anxious the majority of the time.  He was unable to sleep.  I believe the patient was having panic attacks.  Therefore I empirically started him on Lexapro.  However I also ordered a Holter monitor to evaluate for cardiac arrhythmias.  In the meantime, the patient started the Lexapro and also resigned his position from his church.  This was a positive aspect in his life as it removed a tremendous amount of stress and pressure from him.  He is still active in the church but he has offloaded certain responsibilities.  Since that time, he is sleeping calmly through the night.  He has had no further panic attacks.  He feels much better.  In fact he states he feels better now than he ever has.  He denies any further episodes of tachycardia.  However on the Holter monitor, the patient was having frequent occasional PVCs that were isolated.  He also had at least 3 separate runs  of SVT, one that was 10 beats, one that was 7 beats, and 2 that were 4 beats.  Patient denies any syncope or near syncope or dizziness or chest pain or shortness of breath.  At that time, my plan was:  Situational anxiety, generalized anxiety, and panic attacks and insomnia have improved on the Lexapro along with the decreased level of stress in his life.  I have recommended continuing Lexapro at least for the next 2 months and then consider weaning down and discontinuing the medication.  Event monitor revealed episodes of SVT that spontaneously resolved after just a brief moment.  We discussed his options  including cardiology consultation, starting a low-dose beta-blocker, or clinical monitoring.  Patient elects to monitor himself clinically.  If he feels these episodes, we discussed Valsalva maneuvers and techniques to extinguish the SVT.  If an episode occurs that will not extinguish, he knows to go the emergency room for adenosine.  If they become frequent, we will start a beta-blocker and consult cardiology.  I am hesitant to start the beta-blocker today because of his relative bradycardia.  12/19/17 patient has recently developed pain on the right side of his face.  The pain will shoot down his right jaw.  It starts right in front of his right ear.  He has been diagnosed with trigeminal neuralgia.  He was originally seen by my partner here was placed on prednisone for possible TMJ.  This seemed actually help the pain however then it returned.  He went to an urgent care where they gave him a "shot of steroids in the rump".  Is also seem to help the pain which would be unlike trigeminal neuralgia however at the way he describes the pain is a crawling tingling sensation down his right jaw with shooting lancing intense pain does sound neuropathic in nature.  However today he is pain-free.  He has had 2 episodes over the last month of near syncope.  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  He will become nauseated.  He will feel flushed and lightheaded.  He will feel like he is going to pass out.  He will sit down and the symptoms will abate over a period of 1 or 2 minutes.  He believes there may be a component of anxiety to this since its occurring while he is teaching.  He denies any palpitations or tachycardias or irregular heartbeats.   Immunization History  Administered Date(s) Administered  . Influenza Split 10/06/2012  . Influenza, High Dose Seasonal PF 12/02/2016  . Influenza,inj,Quad PF,6+ Mos 11/12/2013, 11/11/2014, 10/20/2015, 11/12/2017  . Pneumococcal Conjugate-13  03/17/2013  . Pneumococcal Polysaccharide-23 02/05/2001  . Td 03/01/2009  . Zoster 02/25/2008    Past Medical History:  Diagnosis Date  . Allergy   . BPH (benign prostatic hypertrophy)   . CAD (coronary artery disease)    s/p cypher DES to pLAD 6/08; normal LVF;  ETT-Myoview 2009: no ischemia   . Eosinophilia   . HTN (hypertension)   . Hyperlipidemia   . Myocardial infarction ([place])   . Prediabetes    Past Surgical History:  Procedure Laterality Date  . APPENDECTOMY    . CARDIAC CATHETERIZATION  07/30/2006   CORONARY ANGIOPLASTY WITH STENT PLACEMENT  . EXPLORATORY LAPAROTOMY     age 63   Current Outpatient Medications on File Prior to Visit  Medication Sig Dispense Refill  . ADVAIR DISKUS 250-50 MCG/DOSE AEPB INHALE 1 PUFF INTO THE LUNGS 2 (TWO) TIMES DAILY. [place]  each 5  . amLODipine-benazepril (LOTREL) 5-20 MG capsule TAKE ONE CAPSULE TWICE A DAY 180 capsule 2  . aspirin EC 81 MG tablet Take 1 tablet (81 mg total) by mouth daily.    Marland Kitchen atorvastatin (LIPITOR) 20 MG tablet Take 20 mg by mouth daily.    . Calcium Carbonate-Vitamin D (CALCARB 600/D) 600-400 MG-UNIT per tablet Take 1 tablet by mouth daily.     . Coenzyme Q-10 100 MG capsule Take 100 mg by mouth daily.    Marland Kitchen doxazosin (CARDURA) 2 MG tablet TAKE 1 TABLET BY MOUTH EVERY DAY 90 tablet 3  . escitalopram (LEXAPRO) 10 MG tablet TAKE 1 TABLET BY MOUTH EVERY DAY 90 tablet 2  . fluticasone (FLONASE) 50 MCG/ACT nasal spray USE 2 SPRAYS INTO BOTH NOSTRILS DAILY. 48 g 3  . NON FORMULARY Take 1 tablet by mouth 2 (two) times daily. Omega Q 1 tab twice daily     . PROAIR HFA 108 (90 Base) MCG/ACT inhaler INHALE 2 PUFFS INTO THE LUNGS EVERY 6 HOURS AS NEEDED FOR WHEEZING OR SHORTNESS OF BREATH 8.5 Inhaler 2  . traMADol (ULTRAM) 50 MG tablet TAKE 1 TABLET EVERY 6 HOURS AS NEEDED 60 tablet 0   No current facility-administered medications on file prior to visit.    No Known Allergies Social History   Socioeconomic History  .  Marital status: Married    Spouse name: [name]  . Number of children: 1  . Years of education: Not on file  . Highest education level: Not on file  Occupational History  . Occupation: retired professor    Comment: History   . Occupation: missionary  Social Needs  . Financial resource strain: Not on file  . Food insecurity:    Worry: Not on file    Inability: Not on file  . Transportation needs:    Medical: Not on file    Non-medical: Not on file  Tobacco Use  . Smoking status: Former Research scientist (life sciences)  . Smokeless tobacco: Never Used  . Tobacco comment: quit over 56yrs ago.  Substance and Sexual Activity  . Alcohol use: Yes    Alcohol/week: 4.0 - 6.0 standard drinks    Types: 4 - 6 Glasses of wine per week  . Drug use: No  . Sexual activity: Yes    Partners: Female  Lifestyle  . Physical activity:    Days per week: Not on file    Minutes per session: Not on file  . Stress: Not on file  Relationships  . Social connections:    Talks on phone: Not on file    Gets together: Not on file    Attends religious service: Not on file    Active member of club or organization: Not on file    Attends meetings of clubs or organizations: Not on file    Relationship status: Not on file  . Intimate partner violence:    Fear of current or ex partner: Not on file    Emotionally abused: Not on file    Physically abused: Not on file    Forced sexual activity: Not on file  Other Topics Concern  . Not on file  Social History Narrative   Lives with his wife (second marriage in 2015, first marriage ended when his wife died alzheimer's disease). Since his remarriage, his adult daughter doesn't speak with him.   Family History  Problem Relation Age of Onset  . Coronary artery disease Father 26  . Coronary artery disease Brother   .  Aortic aneurysm Brother   . Colon cancer Brother   . Stomach cancer Mother   . Esophageal cancer Neg Hx   . Rectal cancer Neg Hx   . Arthritis Daughter         rheumatoid     Review of Systems  All other systems reviewed and are negative.      Objective:   Physical Exam  Constitutional: He is oriented to person, place, and time. He appears well-developed and well-nourished. No distress.  HENT:  Head: Normocephalic and atraumatic.  Right Ear: External ear normal.  Left Ear: External ear normal.  Nose: Nose normal.  Mouth/Throat: Oropharynx is clear and moist. No oropharyngeal exudate.  Eyes: Pupils are equal, round, and reactive to light. Conjunctivae and EOM are normal. Right eye exhibits no discharge. Left eye exhibits no discharge. No scleral icterus.  Neck: Normal range of motion. Neck supple. No JVD present. No tracheal deviation present. No thyromegaly present.  Cardiovascular: Normal rate, regular rhythm, normal heart sounds and intact distal pulses. Exam reveals no gallop and no friction rub.  No murmur heard. Pulmonary/Chest: Effort normal and breath sounds normal. No stridor. No respiratory distress. He has no wheezes. He has no rales. He exhibits no tenderness.  Abdominal: Soft. Bowel sounds are normal. He exhibits no distension and no mass. There is no tenderness. There is no rebound and no guarding.  Musculoskeletal: Normal range of motion. He exhibits no edema or tenderness.  Lymphadenopathy:    He has no cervical adenopathy.  Neurological: He is alert and oriented to person, place, and time. He has normal reflexes. No cranial nerve deficit. He exhibits normal muscle tone. Coordination normal.  Skin: Skin is warm. No rash noted. He is not diaphoretic. No erythema. No pallor.  Psychiatric: He has a normal mood and affect. His behavior is normal. Judgment and thought content normal.  Vitals reviewed.         Assessment & Plan:  Trigeminal neuralgia of right side of face - Plan: carbamazepine (TEGRETOL-XR) 100 MG 12 hr tablet  Near syncope   Given the new sudden onset trigeminal neuralgia, I have recommended an MRI of  the brain.  He is declined this due to cost.  We will try to treat the patient symptomatically with Tegretol 100 mg p.o. twice daily and if the pain intensifies increase to 200 mg p.o. twice daily.  I have asked him to reconsider the MRI if the symptoms worsen or return.  I believe the patient may have had vasovagal reactions while teaching due to anxiety.  He tends to agree with me due to some stress that is currently involved with the position.  He wants to quit teaching however he feels forced to continue.  However I have recommended an event monitor to rule out underlying cardiac arrhythmias given the infrequent nature.  Patient declines this at the present time and will notify me if it reoccurs or if he develops any palpitations

## 2017-12-26 ENCOUNTER — Encounter: Payer: Self-pay | Admitting: Family Medicine

## 2017-12-27 ENCOUNTER — Other Ambulatory Visit: Payer: Self-pay | Admitting: Family Medicine

## 2017-12-27 MED ORDER — GABAPENTIN 300 MG PO CAPS: 300.0000 mg | ORAL_CAPSULE | Freq: Three times a day (TID) | ORAL | 3 refills | Status: DC | PRN

## 2017-12-29 ENCOUNTER — Other Ambulatory Visit: Payer: Self-pay | Admitting: Cardiovascular Disease

## 2017-12-29 DIAGNOSIS — I2510 Atherosclerotic heart disease of native coronary artery without angina pectoris: Secondary | ICD-10-CM

## 2017-12-29 DIAGNOSIS — I10 Essential (primary) hypertension: Secondary | ICD-10-CM

## 2018-01-10 ENCOUNTER — Other Ambulatory Visit: Payer: Self-pay | Admitting: Family Medicine

## 2018-01-10 DIAGNOSIS — G500 Trigeminal neuralgia: Secondary | ICD-10-CM

## 2018-01-13 ENCOUNTER — Other Ambulatory Visit: Payer: Self-pay | Admitting: *Deleted

## 2018-01-13 MED ORDER — FLUTICASONE-SALMETEROL 250-50 MCG/DOSE IN AEPB: INHALATION_SPRAY | RESPIRATORY_TRACT | 5 refills | Status: DC

## 2018-01-19 ENCOUNTER — Other Ambulatory Visit: Payer: Self-pay | Admitting: Family Medicine

## 2018-03-04 ENCOUNTER — Other Ambulatory Visit: Payer: Self-pay | Admitting: Family Medicine

## 2018-03-10 DIAGNOSIS — J209 Acute bronchitis, unspecified: Secondary | ICD-10-CM | POA: Diagnosis not present

## 2018-03-18 ENCOUNTER — Ambulatory Visit (INDEPENDENT_AMBULATORY_CARE_PROVIDER_SITE_OTHER): Payer: PPO | Admitting: Family Medicine

## 2018-03-18 ENCOUNTER — Encounter: Payer: Self-pay | Admitting: Family Medicine

## 2018-03-18 VITALS — BP 122/60 | HR 70 | Temp 97.8°F | Resp 18 | Ht 67.0 in | Wt 210.0 lb

## 2018-03-18 DIAGNOSIS — J40 Bronchitis, not specified as acute or chronic: Secondary | ICD-10-CM

## 2018-03-18 NOTE — Progress Notes (Signed)
Subjective:    Patient ID: [name], male    DOB: [birth year], 85 y.o.   MRN: [id number]  HPI  Patient is a very pleasant 85 year old Caucasian male who was recently seen in urgent care and was diagnosed with bronchitis.  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  He states that originally he was coughing every 2 to 3 minutes.  The cough was dry or productive of clear sputum.  However he denies any fever or shortness of breath or chest pain other than soreness from all the coughing.  After being seen at the urgent care and taking the prednisone, he is now coughing 1-2 times an hour.  It is a very faint cough and it is much improved.  He is here today just to make sure that his lungs are clear per his request.  He denies any chest pain.  He denies any sputum.  He denies any hemoptysis.  He denies any pleurisy.  He denies any fevers or chills.  He does feel weak having just recovered from a 2-1/2-week illness but aside from feeling weak, the patient states he is back to normal. Past Medical History:  Diagnosis Date  . Allergy   . BPH (benign prostatic hypertrophy)   . CAD (coronary artery disease)    s/p cypher DES to pLAD 6/08; normal LVF;  ETT-Myoview 2009: no ischemia   . Eosinophilia   . HTN (hypertension)   . Hyperlipidemia   . Myocardial infarction ([place])   . Prediabetes    Past Surgical History:  Procedure Laterality Date  . APPENDECTOMY    . CARDIAC CATHETERIZATION  07/30/2006   CORONARY ANGIOPLASTY WITH STENT PLACEMENT  . EXPLORATORY LAPAROTOMY     age 78   Current Outpatient Medications on File Prior to Visit  Medication Sig Dispense Refill  . amLODipine-benazepril (LOTREL) 5-20 MG capsule TAKE ONE CAPSULE TWICE A DAY 180 capsule 3  . aspirin EC 81 MG tablet Take 1 tablet (81 mg total) by mouth daily.    Marland Kitchen atorvastatin (LIPITOR) 20 MG tablet Take 20 mg by mouth daily.    . Calcium Carbonate-Vitamin D (CALCARB 600/D) 600-400 MG-UNIT per tablet Take 1  tablet by mouth daily.     . Coenzyme Q-10 100 MG capsule Take 100 mg by mouth daily.    Marland Kitchen doxazosin (CARDURA) 2 MG tablet TAKE 1 TABLET BY MOUTH EVERY DAY 90 tablet 3  . escitalopram (LEXAPRO) 10 MG tablet TAKE 1 TABLET BY MOUTH EVERY DAY 90 tablet 2  . fluticasone (FLONASE) 50 MCG/ACT nasal spray USE 2 SPRAYS INTO BOTH NOSTRILS DAILY. 48 g 3  . Fluticasone-Salmeterol (ADVAIR DISKUS) 250-50 MCG/DOSE AEPB INHALE 1 PUFF INTO THE LUNGS 2 (TWO) TIMES DAILY. 60 each 5  . NON FORMULARY Take 1 tablet by mouth 2 (two) times daily. Omega Q 1 tab twice daily     . PROAIR HFA 108 (90 Base) MCG/ACT inhaler INHALE 2 PUFFS INTO THE LUNGS EVERY 6 HOURS AS NEEDED FOR WHEEZING OR SHORTNESS OF BREATH 8.5 Inhaler 2  . traMADol (ULTRAM) 50 MG tablet TAKE 1 TABLET EVERY 6 HOURS AS NEEDED 60 tablet 0   No current facility-administered medications on file prior to visit.    No Known Allergies Social History   Socioeconomic History  . Marital status: Married    Spouse name: [name]  . Number of children: 1  . Years of education: Not on file  . Highest education level: Not on file  Occupational History  . Occupation: retired professor    Comment: History   . Occupation: missionary  Social Needs  . Financial resource strain: Not on file  . Food insecurity:    Worry: Not on file    Inability: Not on file  . Transportation needs:    Medical: Not on file    Non-medical: Not on file  Tobacco Use  . Smoking status: Former Research scientist (life sciences)  . Smokeless tobacco: Never Used  . Tobacco comment: quit over 58yrs ago.  Substance and Sexual Activity  . Alcohol use: Yes    Alcohol/week: 4.0 - 6.0 standard drinks    Types: 4 - 6 Glasses of wine per week  . Drug use: No  . Sexual activity: Yes    Partners: Female  Lifestyle  . Physical activity:    Days per week: Not on file    Minutes per session: Not on file  . Stress: Not on file  Relationships  . Social connections:    Talks on phone: Not on file    Gets  together: Not on file    Attends religious service: Not on file    Active member of club or organization: Not on file    Attends meetings of clubs or organizations: Not on file    Relationship status: Not on file  . Intimate partner violence:    Fear of current or ex partner: Not on file    Emotionally abused: Not on file    Physically abused: Not on file    Forced sexual activity: Not on file  Other Topics Concern  . Not on file  Social History Narrative   Lives with his wife (second marriage in 2015, first marriage ended when his wife died alzheimer's disease). Since his remarriage, his adult daughter doesn't speak with him.   Family History  Problem Relation Age of Onset  . Coronary artery disease Father 32  . Coronary artery disease Brother   . Aortic aneurysm Brother   . Colon cancer Brother   . Stomach cancer Mother   . Esophageal cancer Neg Hx   . Rectal cancer Neg Hx   . Arthritis Daughter        rheumatoid     Review of Systems  All other systems reviewed and are negative.      Objective:   Physical Exam  Constitutional: He is oriented to person, place, and time. He appears well-developed and well-nourished. No distress.  HENT:  Head: Normocephalic and atraumatic.  Right Ear: External ear normal.  Left Ear: External ear normal.  Nose: Nose normal.  Mouth/Throat: Oropharynx is clear and moist. No oropharyngeal exudate.  Eyes: Pupils are equal, round, and reactive to light. Conjunctivae and EOM are normal. Right eye exhibits no discharge. Left eye exhibits no discharge. No scleral icterus.  Neck: Normal range of motion. Neck supple. No JVD present. No tracheal deviation present. No thyromegaly present.  Cardiovascular: Normal rate, regular rhythm, normal heart sounds and intact distal pulses. Exam reveals no gallop and no friction rub.  No murmur heard. Pulmonary/Chest: Effort normal and breath sounds normal. No stridor. No respiratory distress. He has no  wheezes. He has no rales. He exhibits no tenderness.  Abdominal: Soft. Bowel sounds are normal. He exhibits no distension and no mass. There is no abdominal tenderness. There is no rebound and no guarding.  Musculoskeletal: Normal range of motion.        General: No tenderness or edema.  Lymphadenopathy:  He has no cervical adenopathy.  Neurological: He is alert and oriented to person, place, and time. He has normal reflexes. No cranial nerve deficit. He exhibits normal muscle tone. Coordination normal.  Skin: Skin is warm. No rash noted. He is not diaphoretic. No erythema. No pallor.  Psychiatric: He has a normal mood and affect. His behavior is normal. Judgment and thought content normal.  Vitals reviewed.         Assessment & Plan:  Bronchitis  Patient is recovering from bronchitis.  His lungs are clear to auscultation bilaterally today.  There are no wheezes crackles or rails.  He denies having any red flag symptoms such as fever, hemoptysis, purulent sputum, or chest pain.  He states that he slowly recovering and returning to normal.  No further follow-up is necessary for the bronchitis.  Recheck immediately if symptoms worsen or change.

## 2018-04-05 ENCOUNTER — Other Ambulatory Visit: Payer: Self-pay | Admitting: Family Medicine

## 2018-04-07 ENCOUNTER — Ambulatory Visit (INDEPENDENT_AMBULATORY_CARE_PROVIDER_SITE_OTHER): Payer: PPO | Admitting: Family Medicine

## 2018-04-07 ENCOUNTER — Encounter: Payer: Self-pay | Admitting: Family Medicine

## 2018-04-07 VITALS — BP 148/62 | HR 60 | Temp 97.6°F | Resp 14 | Ht 67.0 in | Wt 214.0 lb

## 2018-04-07 DIAGNOSIS — I2510 Atherosclerotic heart disease of native coronary artery without angina pectoris: Secondary | ICD-10-CM | POA: Diagnosis not present

## 2018-04-07 DIAGNOSIS — Z0000 Encounter for general adult medical examination without abnormal findings: Secondary | ICD-10-CM | POA: Diagnosis not present

## 2018-04-07 DIAGNOSIS — I10 Essential (primary) hypertension: Secondary | ICD-10-CM

## 2018-04-07 DIAGNOSIS — E782 Mixed hyperlipidemia: Secondary | ICD-10-CM | POA: Diagnosis not present

## 2018-04-07 DIAGNOSIS — R7303 Prediabetes: Secondary | ICD-10-CM | POA: Diagnosis not present

## 2018-04-07 DIAGNOSIS — G500 Trigeminal neuralgia: Secondary | ICD-10-CM

## 2018-04-07 MED ORDER — TRAMADOL HCL 50 MG PO TABS: 50.0000 mg | ORAL_TABLET | Freq: Four times a day (QID) | ORAL | 0 refills | Status: DC | PRN

## 2018-04-07 NOTE — Progress Notes (Signed)
Subjective:    Patient ID: [name], male    DOB: Apr 04, 1933, 85 y.o.   MRN: [id number]  HPI Patient is here today for complete physical exam.  Due to his advanced age, I have recommended against a colonoscopy or prostate cancer screening.  Patient states that he is doing well.  He denies any issues with depression.  He is taking Lexapro and the medication seems to be working well for him.  He denies any issues with memory loss.  He denies any falls.  He has been having more problems with pain in his knees and ankles elbows and wrist which he attributes to arthritis.  He wants to avoid NSAIDs due to his history of coronary artery disease.  He has used tramadol sparingly in the past and he would like a refill on this medication to use in case he is having a bad day in the future.  He does not plan to use this medication every day.  Otherwise he is doing well with no concerns   Immunization History  Administered Date(s) Administered  . Influenza Split 10/06/2012  . Influenza, High Dose Seasonal PF 12/02/2016  . Influenza,inj,Quad PF,6+ Mos 11/12/2013, 11/11/2014, 10/20/2015, 11/12/2017  . Pneumococcal Conjugate-13 03/17/2013  . Pneumococcal Polysaccharide-23 02/05/2001  . Td 03/01/2009  . Zoster 02/25/2008    Past Medical History:  Diagnosis Date  . Allergy   . BPH (benign prostatic hypertrophy)   . CAD (coronary artery disease)    s/p cypher DES to pLAD 6/08; normal LVF;  ETT-Myoview 2009: no ischemia   . Eosinophilia   . HTN (hypertension)   . Hyperlipidemia   . Myocardial infarction ([place])   . Prediabetes    Past Surgical History:  Procedure Laterality Date  . APPENDECTOMY    . CARDIAC CATHETERIZATION  07/30/2006   CORONARY ANGIOPLASTY WITH STENT PLACEMENT  . EXPLORATORY LAPAROTOMY     age 47   Current Outpatient Medications on File Prior to Visit  Medication Sig Dispense Refill  . amLODipine-benazepril (LOTREL) 5-20 MG capsule TAKE ONE CAPSULE TWICE A DAY 180  capsule 3  . aspirin EC 81 MG tablet Take 1 tablet (81 mg total) by mouth daily.    Marland Kitchen atorvastatin (LIPITOR) 20 MG tablet Take 20 mg by mouth daily.    . Calcium Carbonate-Vitamin D (CALCARB 600/D) 600-400 MG-UNIT per tablet Take 1 tablet by mouth daily.     . Coenzyme Q-10 100 MG capsule Take 100 mg by mouth daily.    Marland Kitchen doxazosin (CARDURA) 2 MG tablet TAKE 1 TABLET BY MOUTH EVERY DAY 90 tablet 3  . escitalopram (LEXAPRO) 10 MG tablet TAKE 1 TABLET BY MOUTH EVERY DAY 90 tablet 2  . fluticasone (FLONASE) 50 MCG/ACT nasal spray USE 2 SPRAYS INTO BOTH NOSTRILS DAILY. 48 g 3  . Fluticasone-Salmeterol (ADVAIR DISKUS) 250-50 MCG/DOSE AEPB INHALE 1 PUFF INTO THE LUNGS 2 (TWO) TIMES DAILY. 60 each 5  . NON FORMULARY Take 1 tablet by mouth 2 (two) times daily. Omega Q 1 tab twice daily     . PROAIR HFA 108 (90 Base) MCG/ACT inhaler INHALE 2 PUFFS INTO THE LUNGS EVERY 6 HOURS AS NEEDED FOR WHEEZING OR SHORTNESS OF BREATH 8.5 Inhaler 2  . traMADol (ULTRAM) 50 MG tablet TAKE 1 TABLET EVERY 6 HOURS AS NEEDED 60 tablet 0   No current facility-administered medications on file prior to visit.    No Known Allergies Social History   Socioeconomic History  . Marital status: Married  Spouse name: [name]  . Number of children: 1  . Years of education: Not on file  . Highest education level: Not on file  Occupational History  . Occupation: retired professor    Comment: History   . Occupation: missionary  Social Needs  . Financial resource strain: Not on file  . Food insecurity:    Worry: Not on file    Inability: Not on file  . Transportation needs:    Medical: Not on file    Non-medical: Not on file  Tobacco Use  . Smoking status: Former Research scientist (life sciences)  . Smokeless tobacco: Never Used  . Tobacco comment: quit over 21yrs ago.  Substance and Sexual Activity  . Alcohol use: Yes    Alcohol/week: 4.0 - 6.0 standard drinks    Types: 4 - 6 Glasses of wine per week  . Drug use: No  . Sexual activity: Yes      Partners: Female  Lifestyle  . Physical activity:    Days per week: Not on file    Minutes per session: Not on file  . Stress: Not on file  Relationships  . Social connections:    Talks on phone: Not on file    Gets together: Not on file    Attends religious service: Not on file    Active member of club or organization: Not on file    Attends meetings of clubs or organizations: Not on file    Relationship status: Not on file  . Intimate partner violence:    Fear of current or ex partner: Not on file    Emotionally abused: Not on file    Physically abused: Not on file    Forced sexual activity: Not on file  Other Topics Concern  . Not on file  Social History Narrative   Lives with his wife (second marriage in 2015, first marriage ended when his wife died alzheimer's disease). Since his remarriage, his adult daughter doesn't speak with him.   Family History  Problem Relation Age of Onset  . Coronary artery disease Father 42  . Coronary artery disease Brother   . Aortic aneurysm Brother   . Colon cancer Brother   . Stomach cancer Mother   . Esophageal cancer Neg Hx   . Rectal cancer Neg Hx   . Arthritis Daughter        rheumatoid     Review of Systems  All other systems reviewed and are negative.      Objective:   Physical Exam  Constitutional: He is oriented to person, place, and time. He appears well-developed and well-nourished. No distress.  HENT:  Head: Normocephalic and atraumatic.  Right Ear: External ear normal.  Left Ear: External ear normal.  Nose: Nose normal.  Mouth/Throat: Oropharynx is clear and moist. No oropharyngeal exudate.  Eyes: Pupils are equal, round, and reactive to light. Conjunctivae and EOM are normal. Right eye exhibits no discharge. Left eye exhibits no discharge. No scleral icterus.  Neck: Normal range of motion. Neck supple. No JVD present. No tracheal deviation present. No thyromegaly present.  Cardiovascular: Normal rate, regular  rhythm, normal heart sounds and intact distal pulses. Exam reveals no gallop and no friction rub.  No murmur heard. Pulmonary/Chest: Effort normal and breath sounds normal. No stridor. No respiratory distress. He has no wheezes. He has no rales. He exhibits no tenderness.  Abdominal: Soft. Bowel sounds are normal. He exhibits no distension and no mass. There is no abdominal tenderness. There is no  rebound and no guarding.  Musculoskeletal: Normal range of motion.        General: No tenderness or edema.  Lymphadenopathy:    He has no cervical adenopathy.  Neurological: He is alert and oriented to person, place, and time. He has normal reflexes. No cranial nerve deficit. He exhibits normal muscle tone. Coordination normal.  Skin: Skin is warm. No rash noted. He is not diaphoretic. No erythema. No pallor.  Psychiatric: He has a normal mood and affect. His behavior is normal. Judgment and thought content normal.  Vitals reviewed.         Assessment & Plan:  Routine general medical examination at a health care facility  ASCVD (arteriosclerotic cardiovascular disease) - Plan: Hemoglobin A1c, CBC with Differential/Platelet, COMPLETE METABOLIC PANEL WITH GFR, Lipid panel, Microalbumin, urine  Essential hypertension  Prediabetes - Plan: Hemoglobin A1c, CBC with Differential/Platelet, COMPLETE METABOLIC PANEL WITH GFR, Lipid panel, Microalbumin, urine  Trigeminal neuralgia of right side of face  HYPERLIPIDEMIA TYPE IIB / III  Patient's blood pressure today is elevated however he was rushing to get here and was frustrated because his daughter was running late.  He states that he is checking his blood pressure at home and is typically well controlled.  He is asked to check his blood pressure more frequently at home and let me know the values in 1 week prior to making any changes.  Will monitor his prediabetes with a hemoglobin A1c.  Given his history of coronary artery disease we will check a CMP  and a fasting lipid panel.  His goal LDL cholesterol is less than 70.  He denies any issues with the trigeminal nerve pain he was previously having on the right side of his face.  At the present time we will not treat this.  His immunizations are up-to-date aside for the shingles vaccine.  I encouraged him to check on the price of this.  He declines colon cancer screening and prostate cancer screening. Patient's physical exam today is completely normal.

## 2018-04-08 LAB — COMPLETE METABOLIC PANEL WITH GFR
AG Ratio: 1.9 (calc) (ref 1.0–2.5)
ALT: 18 U/L (ref 9–46)
AST: 29 U/L (ref 10–35)
Albumin: 4.1 g/dL (ref 3.6–5.1)
Alkaline phosphatase (APISO): 72 U/L (ref 35–144)
BUN/Creatinine Ratio: 17 (calc) (ref 6–22)
BUN: 26 mg/dL — ABNORMAL HIGH (ref 7–25)
CO2: 25 mmol/L (ref 20–32)
Calcium: 9.2 mg/dL (ref 8.6–10.3)
Chloride: 107 mmol/L (ref 98–110)
Creat: 1.51 mg/dL — ABNORMAL HIGH (ref 0.70–1.11)
GFR, Est African American: 49 mL/min/{1.73_m2} — ABNORMAL LOW (ref >=60)
GFR, Est Non African American: 42 mL/min/{1.73_m2} — ABNORMAL LOW (ref >=60)
Globulin: 2.2 g/dL (calc) (ref 1.9–3.7)
Glucose, Bld: 92 mg/dL (ref 65–99)
Potassium: 4.8 mmol/L (ref 3.5–5.3)
Sodium: 141 mmol/L (ref 135–146)
Total Bilirubin: 0.6 mg/dL (ref 0.2–1.2)
Total Protein: 6.3 g/dL (ref 6.1–8.1)

## 2018-04-08 LAB — HEMOGLOBIN A1C
Hgb A1c MFr Bld: 6.1 % of total Hgb — ABNORMAL HIGH (ref <5.7)
Mean Plasma Glucose: 128 (calc)
eAG (mmol/L): 7.1 (calc)

## 2018-04-08 LAB — LIPID PANEL
Cholesterol: 171 mg/dL (ref <200)
HDL: 62 mg/dL (ref >=40)
LDL Cholesterol (Calc): 92 mg/dL (calc)
Non-HDL Cholesterol (Calc): 109 mg/dL (calc) (ref <130)
Total CHOL/HDL Ratio: 2.8 (calc) (ref <5.0)
Triglycerides: 76 mg/dL (ref <150)

## 2018-04-08 LAB — CBC WITH DIFFERENTIAL/PLATELET
Absolute Monocytes: 530 cells/uL (ref 200–950)
Basophils Absolute: 23 cells/uL (ref 0–200)
Basophils Relative: 0.4 %
Eosinophils Absolute: 730 cells/uL — ABNORMAL HIGH (ref 15–500)
Eosinophils Relative: 12.8 %
HCT: 36.6 % — ABNORMAL LOW (ref 38.5–50.0)
Hemoglobin: 12.4 g/dL — ABNORMAL LOW (ref 13.2–17.1)
Lymphs Abs: 1060 cells/uL (ref 850–3900)
MCH: 32.3 pg (ref 27.0–33.0)
MCHC: 33.9 g/dL (ref 32.0–36.0)
MCV: 95.3 fL (ref 80.0–100.0)
MPV: 9.3 fL (ref 7.5–12.5)
Monocytes Relative: 9.3 %
Neutro Abs: 3357 cells/uL (ref 1500–7800)
Neutrophils Relative %: 58.9 %
Platelets: 181 10*3/uL (ref 140–400)
RBC: 3.84 10*6/uL — ABNORMAL LOW (ref 4.20–5.80)
RDW: 12.2 % (ref 11.0–15.0)
Total Lymphocyte: 18.6 %
WBC: 5.7 10*3/uL (ref 3.8–10.8)

## 2018-04-08 LAB — MICROALBUMIN, URINE: Microalb, Ur: 27.1 mg/dL

## 2018-04-14 ENCOUNTER — Encounter: Payer: Self-pay | Admitting: Family Medicine

## 2018-04-20 ENCOUNTER — Encounter: Payer: Self-pay | Admitting: Family Medicine

## 2018-04-30 ENCOUNTER — Encounter: Payer: Self-pay | Admitting: Family Medicine

## 2018-06-20 ENCOUNTER — Encounter: Payer: Self-pay | Admitting: Family Medicine

## 2018-06-23 ENCOUNTER — Other Ambulatory Visit: Payer: Self-pay | Admitting: Family Medicine

## 2018-06-23 MED ORDER — OXYCODONE-ACETAMINOPHEN 5-325 MG PO TABS: 1.0000 | ORAL_TABLET | ORAL | 0 refills | Status: DC | PRN

## 2018-07-01 ENCOUNTER — Other Ambulatory Visit: Payer: Self-pay | Admitting: Family Medicine

## 2018-09-17 ENCOUNTER — Other Ambulatory Visit: Payer: Self-pay | Admitting: Family Medicine

## 2018-11-13 ENCOUNTER — Ambulatory Visit (HOSPITAL_COMMUNITY)
Admission: RE | Admit: 2018-11-13 | Discharge: 2018-11-13 | Disposition: A | Discharge status: Home / Self Care | Payer: PPO | Source: Ambulatory Visit | Attending: Cardiology | Admitting: Cardiology

## 2018-11-13 ENCOUNTER — Other Ambulatory Visit (HOSPITAL_COMMUNITY): Payer: Self-pay | Admitting: Cardiovascular Disease

## 2018-11-13 ENCOUNTER — Other Ambulatory Visit: Payer: Self-pay

## 2018-11-13 DIAGNOSIS — I6529 Occlusion and stenosis of unspecified carotid artery: Secondary | ICD-10-CM

## 2018-11-13 DIAGNOSIS — I6523 Occlusion and stenosis of bilateral carotid arteries: Secondary | ICD-10-CM

## 2018-11-18 ENCOUNTER — Other Ambulatory Visit: Payer: Self-pay | Admitting: Family Medicine

## 2018-11-18 NOTE — Progress Notes (Signed)
Cardiology Office Note:    Date:  11/19/2018   ID:  [name], DOB 1933/04/22, MRN [id number]  PCP:  [name] Frizzle, MD  Cardiologist:  Sherren Mocha, MD  Electrophysiologist:  None   Referring MD: [name] Frizzle, MD   Chief Complaint: follow-up of CAD   History of Present Illness:    [name] is a 85 y.o. male with a history of CAD s/p DES to proximal LAD in 2008, mild bilateral carotid artery stenosis on dopplers in 11/2018, hypertension, hyperlipidemia, and CKD stage III who is followed by Dr. [name].   Patient underwent PCI with DES to proximal LAD in 2008 with a Cypher DES. He was also noted to have moderate residual coronary disease at that time for which medical therapy was recommended. He has done well since then with no recurrent ischemic events. He did have an exercise treadmill test in 2014 which demonstrated no significant ST/T changes. Patient also has known mild bilateral carotid artery stenosis with most recent carotid dopplers on 11/13/2018 showing 1-39% stenosis of bilateral ICAs.  Patient was last seen by Dr. Angelena Form in 11/2017 at which time he was doing very well and denied any cardiac symptoms. Plan was to follow-up in 1 year.  Most Recent Labs Reviewed: - 04/2018 from PCP: Hgb 12.4, Plts 181, BUN 26, Cr 1.51 (slightly improved from 2019), K 4.8, LDL 92 (up from 71 in 2019), Hgb A1c 6.1%.  Patient presents today for follow-up. Patient doing very well. 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 He is staying active walking 1-1.5 miles 3-4 times per week. Also occasionally doing push up. He as lost about 10 lbs since office visit with PCP in 04/2018. He monitors his BP at home and states it is usually 120-130/60. He denies any cardiac symptoms including chest pain, shortness of breath, palpitations, lightheadedness, dizziness, syncope, orthopnea, or PND. He has some mild lower extremity swelling due to the Amlodipine  but states this has been stable for years. No abnormal bleeding. No complaints at this time.   Past Medical History:  Diagnosis Date  . Allergy   . BPH (benign prostatic hypertrophy)   . CAD (coronary artery disease)    s/p cypher DES to pLAD 6/08; normal LVF;  ETT-Myoview 2009: no ischemia   . Eosinophilia   . HTN (hypertension)   . Hyperlipidemia   . Myocardial infarction ([place])   . Prediabetes     Past Surgical History:  Procedure Laterality Date  . APPENDECTOMY    . CARDIAC CATHETERIZATION  07/30/2006   CORONARY ANGIOPLASTY WITH STENT PLACEMENT  . EXPLORATORY LAPAROTOMY     age 80    Current Medications: Current Meds  Medication Sig  . amLODipine-benazepril (LOTREL) 5-20 MG capsule TAKE ONE CAPSULE TWICE A DAY  . aspirin EC 81 MG tablet Take 1 tablet (81 mg total) by mouth daily.  . Calcium Carbonate-Vitamin D (CALCARB 600/D) 600-400 MG-UNIT per tablet Take 1 tablet by mouth daily.   . Coenzyme Q-10 100 MG capsule Take 100 mg by mouth daily.  Marland Kitchen doxazosin (CARDURA) 2 MG tablet TAKE 1 TABLET BY MOUTH EVERY DAY  . escitalopram (LEXAPRO) 10 MG tablet TAKE 1 TABLET BY MOUTH EVERY DAY  . fluticasone (FLONASE) 50 MCG/ACT nasal spray USE 2 SPRAYS INTO BOTH NOSTRILS DAILY.  Marland Kitchen Fluticasone-Salmeterol (ADVAIR DISKUS) 250-50 MCG/DOSE AEPB INHALE 1 PUFF INTO THE LUNGS 2 (TWO) TIMES DAILY.  . NON FORMULARY Take 1 tablet by  mouth 2 (two) times daily. Omega Q 1 tab twice daily   . PROAIR HFA 108 (90 Base) MCG/ACT inhaler INHALE 2 PUFFS INTO THE LUNGS EVERY 6 HOURS AS NEEDED FOR WHEEZING OR SHORTNESS OF BREATH  . traMADol (ULTRAM) 50 MG tablet Take 1 tablet (50 mg total) by mouth every 6 (six) hours as needed.  . [DISCONTINUED] atorvastatin (LIPITOR) 20 MG tablet TAKE 1 TABLET EVERY DAY     Allergies:   Patient has no known allergies.   Social History   Socioeconomic History  . Marital status: Married    Spouse name: [name]  . Number of children: 1  . Years of education: Not on file   . Highest education level: Not on file  Occupational History  . Occupation: retired professor    Comment: History   . Occupation: missionary  Social Needs  . Financial resource strain: Not on file  . Food insecurity    Worry: Not on file    Inability: Not on file  . Transportation needs    Medical: Not on file    Non-medical: Not on file  Tobacco Use  . Smoking status: Former Research scientist (life sciences)  . Smokeless tobacco: Never Used  . Tobacco comment: quit over 61yrs ago.  Substance and Sexual Activity  . Alcohol use: Yes    Alcohol/week: 4.0 - 6.0 standard drinks    Types: 4 - 6 Glasses of wine per week  . Drug use: No  . Sexual activity: Yes    Partners: Female  Lifestyle  . Physical activity    Days per week: Not on file    Minutes per session: Not on file  . Stress: Not on file  Relationships  . Social Herbalist on phone: Not on file    Gets together: Not on file    Attends religious service: Not on file    Active member of club or organization: Not on file    Attends meetings of clubs or organizations: Not on file    Relationship status: Not on file  Other Topics Concern  . Not on file  Social History Narrative   Lives with his wife (second marriage in 2015, first marriage ended when his wife died alzheimer's disease). Since his remarriage, his adult daughter doesn't speak with him.     Family History: The patient's family history includes Aortic aneurysm in his brother; Arthritis in his daughter; Colon cancer in his brother; Coronary artery disease in his brother; Coronary artery disease (age of onset: 79) in his father; Stomach cancer in his mother. There is no history of Esophageal cancer or Rectal cancer.  ROS:   Please see the history of present illness.    All other systems reviewed and are negative.  EKGs/Labs/Other Studies Reviewed:    The following studies were reviewed today:  Cardiac Catheterization 07/30/2006: Findings: Aortic pressure 173/83 with a  mean of 123, left ventricular pressure 174/18.  - Left mainstem is angiographically normal.  It bifurcates into the LAD and left circumflex. - The LAD is a large-caliber vessel that courses down to the LV apex. There is a high-grade focal irregular stenosis in the proximal portion of the vessel.  It is 95% occlusive.  There is a medium-size diagonal branch that has a 70% ostial stenosis arising from the area of the lesion.  The remaining portions of the mid and distal LAD have diffuse nonobstructive stenosis.  There is a medium-size third diagonal branch, and just beyond that branch,  there is a 30% stenosis. - The left circumflex is a large-caliber vessel.  It courses down and is at least a 4 mm vessel in its proximal and mid portion.  That portion of the vessel has a 25-30% focal stenosis.  It provides a large left posterolateral branch that bifurcates into multiple branches and supplies much of the inferolateral wall.  The second branch of that vessel has a 30-40% stenosis at its origin.  The mid circumflex following the posterolateral branch is a small vessel. - The right coronary artery is diffusely diseased.  It is a medium-size vessel that has a 60% proximal stenosis followed by 40% mid stenosis. Distally, there is a 50% stenosis just before the PDA, and the origin of the PDA branch has a 75% stenosis. Further out in the PDA, there is a 70-75% stenosis in the mid portion of that vessel. The PDA is only a 2 mm vessel.  Left ventricular function assessed by 30-degree RAO left ventriculography is normal.  The LVEF is 55-60%.  There is no mitral regurgitation.  Assessment:  1. Critical proximal left anterior descending stenosis treated with a      single drug-eluting stent as above.  2. Moderate diffuse right coronary artery stenosis.  3. Nonobstructive left circumflex stenosis.  4. Normal left ventricular function.  5. Successful percutaneous coronary intervention  of the left anterior  descending.  Plan: The patient will be transferred to the post catheterization  recovery area where the sheath will be pulled in 2 hours.  He should  receive aspirin and clopidogrel for a minimum of 1 year.  He will he  will require medical therapy for his coronary artery disease with  initiation of a statin  and a beta blocker. _______________  Carotid Ultrasounds 11/13/2018: Summary: - Right Carotid: Velocities in the right ICA are consistent with a 1-39% stenosis. Non-hemodynamically significant plaque <50% noted in the CCA. The RICA velocities remain within normal range and stable compared to prior exam. - Left Carotid: Velocities in the left ICA are consistent with a 1-39% stenosis. Non-hemodynamically significant plaque <50% noted in the CCA. The LICA velocities remain within normal range and stable compared to prior exam. - Vertebrals: Right vertebral artery demonstrates antegrade flow. Atypical antegrade flow in the left vertebral artery. - Subclavians: Normal flow hemodynamics were seen in bilateral subclavian arteries.  EKG:  EKG ordered today. EKG personally reviewed and demonstrates normal sinus rhythm, rate 67 bpm, with left axis deviation, LAFB, and T wave flattening in lead aVL. No significant changes from prior tracing in 2019.  Recent Labs: 04/07/2018: ALT 18; BUN 26; Creat 1.51; Hemoglobin 12.4; Platelets 181; Potassium 4.8; Sodium 141  Recent Lipid Panel    Component Value Date/Time   CHOL 171 04/07/2018 0827   TRIG 76 04/07/2018 0827   HDL 62 04/07/2018 0827   CHOLHDL 2.8 04/07/2018 0827   VLDL 8 03/29/2016 0805   LDLCALC 92 04/07/2018 0827    Physical Exam:    Vital Signs: BP 132/68   Pulse 70   Ht 5\' 7"  (1.702 m)   Wt 204 lb 9.6 oz (92.8 kg)   SpO2 98%   BMI 32.04 kg/m     Wt Readings from Last 3 Encounters:  11/19/18 204 lb 9.6 oz (92.8 kg)  04/07/18 214 lb (97.1 kg)  03/18/18 210 lb (95.3 kg)     General: 85 y.o. male  in no acute distress. HEENT:  Normocephalic and atraumatic. Sclera clear. Neck: Supple. No significant carotid  bruits appreciated. No JVD. Heart: RRR. Distinct S1 and S2. No murmurs, gallops, or rubs. Radial and posterior tibial pulses 2+ and equal bilaterally. Lungs: No increased work of breathing. Clear to ausculation bilaterally. No wheezes, rhonchi, or rales.  Abdomen: Soft, non-distended, and non-tender to palpation. Bowel sounds present. MSK: Normal strength and tone for age. Extremities: Trace lower extremity edema.    Skin: Warm and dry. Neuro: Alert and oriented x3. No focal deficits. Psych: Normal affect. Responds appropriately.  Assessment:    1. Coronary artery disease involving native coronary artery of native heart without angina pectoris   2. Bilateral carotid artery stenosis   3. Essential hypertension   4. Hyperlipidemia, unspecified hyperlipidemia type   5. Stage 3 chronic kidney disease, unspecified whether stage 3a or 3b CKD     Plan:    CAD without Angina - Remote DES to proximal LAD in 2008. No recurrent ischemic events since that time. - Stable. No angina.  - No acute changes on EKG today. - Continue Aspirin, Lipitor, and Amlodipine-Benazepril.  Mild Bilateral Carotid Stenosis - Most recent carotid dopplers on 11/13/2018 showed 1-39% stenosis of bilateral ICAs. - Continue aspirin and statin.  - Plan is for repeat dopplers in 11/2019.   Hypertension - BP well controlled at 132/68.  - Continue Amlodipine-Benazepril 5-20mg  daily.  - Patient does have CKD stage III but renal function has been stable over last 2 years and is followed by PCP. OK to continue ACEi for now.  Hyperlipidemia - Most recent lipid panel from 04/2018: Total Cholesterol 171, HDL 62, Triglycerides 76, LDL 92. - LDL goal <70 given CAD. - PCP recommended increasing Lipitor from 20mg  to 40mg  daily in 04/2018 but it does not look like this was done. I agree with this, so we will increase dose today. - Will recheck fasting  lipid panel and LFTs in 2 months.  CKD Stage III - Most recent creatinine 1.51 in 04/2018 which is slightly improved from year before. - Followed by PCP.  Disposition: Follow up with Dr. [name] in 1 year.   Medication Adjustments/Labs and Tests Ordered: Current medicines are reviewed at length with the patient today.  Concerns regarding medicines are outlined above.  Orders Placed This Encounter  Procedures  . Lipid panel  . Hepatic function panel  . EKG 12-Lead   Meds ordered this encounter  Medications  . atorvastatin (LIPITOR) 40 MG tablet    Sig: Take 1 tablet (40 mg total) by mouth daily.    Dispense:  90 tablet    Refill:  3    Patient Instructions  Medication Instructions:  Your physician has recommended you make the following change in your medication:  1. INCREASE LIPITOR TO 40 MG  If you need a refill on your cardiac medications before your next appointment, please call your pharmacy.   Lab work: TO BE DONE IN 2 MONTHS: LFTS, LIPIDS   If you have labs (blood work) drawn today and your tests are completely normal, you will receive your results only by: Marland Kitchen MyChart Message (if you have MyChart) OR . A paper copy in the mail If you have any lab test that is abnormal or we need to change your treatment, we will call you to review the results.  Testing/Procedures: NONE ORDERED  Follow-Up: At [facility], you and your health needs are our priority.  As part of our continuing mission to provide you with exceptional heart care, we have created designated Provider Care Teams.  These Care  Teams include your primary Cardiologist (physician) and Advanced Practice Providers (APPs -  Physician Assistants and Nurse Practitioners) who all work together to provide you with the care you need, when you need it. You will need a follow up appointment in:  12 months.  Please call our office 2 months in advance to schedule this appointment.  You may see Sherren Mocha, MD or one of  the following Advanced Practice Providers on your designated Care Team: Richardson Dopp, PA-C [place], [place] . Daune Perch, NP        Signed, Darreld Mclean, PA-C  11/19/2018 8:43 AM    [place]

## 2018-11-19 ENCOUNTER — Encounter: Payer: Self-pay | Admitting: Physician Assistant

## 2018-11-19 ENCOUNTER — Other Ambulatory Visit: Payer: Self-pay

## 2018-11-19 ENCOUNTER — Ambulatory Visit (INDEPENDENT_AMBULATORY_CARE_PROVIDER_SITE_OTHER): Payer: PPO | Admitting: Student

## 2018-11-19 VITALS — BP 132/68 | HR 70 | Ht 67.0 in | Wt 204.6 lb

## 2018-11-19 DIAGNOSIS — I2510 Atherosclerotic heart disease of native coronary artery without angina pectoris: Secondary | ICD-10-CM | POA: Diagnosis not present

## 2018-11-19 DIAGNOSIS — N183 Chronic kidney disease, stage 3 unspecified: Secondary | ICD-10-CM | POA: Diagnosis not present

## 2018-11-19 DIAGNOSIS — E785 Hyperlipidemia, unspecified: Secondary | ICD-10-CM | POA: Diagnosis not present

## 2018-11-19 DIAGNOSIS — I6523 Occlusion and stenosis of bilateral carotid arteries: Secondary | ICD-10-CM

## 2018-11-19 DIAGNOSIS — I10 Essential (primary) hypertension: Secondary | ICD-10-CM | POA: Diagnosis not present

## 2018-11-19 MED ORDER — ATORVASTATIN CALCIUM 40 MG PO TABS: 40.0000 mg | ORAL_TABLET | Freq: Every day | ORAL | 3 refills | Status: DC

## 2018-11-19 NOTE — Patient Instructions (Signed)
Medication Instructions:  Your physician has recommended you make the following change in your medication:  1. INCREASE LIPITOR TO 40 MG  If you need a refill on your cardiac medications before your next appointment, please call your pharmacy.   Lab work: TO BE DONE IN 2 MONTHS: LFTS, LIPIDS   If you have labs (blood work) drawn today and your tests are completely normal, you will receive your results only by: Marland Kitchen MyChart Message (if you have MyChart) OR . A paper copy in the mail If you have any lab test that is abnormal or we need to change your treatment, we will call you to review the results.  Testing/Procedures: NONE ORDERED  Follow-Up: At [facility], you and your health needs are our priority.  As part of our continuing mission to provide you with exceptional heart care, we have created designated Provider Care Teams.  These Care Teams include your primary Cardiologist (physician) and Advanced Practice Providers (APPs -  Physician Assistants and Nurse Practitioners) who all work together to provide you with the care you need, when you need it. You will need a follow up appointment in:  12 months.  Please call our office 2 months in advance to schedule this appointment.  You may see Sherren Mocha, MD or one of the following Advanced Practice Providers on your designated Care Team: Richardson Dopp, PA-C [place], [place] . Daune Perch, NP

## 2018-11-26 ENCOUNTER — Other Ambulatory Visit: Payer: Self-pay | Admitting: Family Medicine

## 2018-11-26 ENCOUNTER — Other Ambulatory Visit: Payer: Self-pay | Admitting: Cardiovascular Disease

## 2018-11-26 DIAGNOSIS — I2510 Atherosclerotic heart disease of native coronary artery without angina pectoris: Secondary | ICD-10-CM

## 2018-11-26 DIAGNOSIS — I10 Essential (primary) hypertension: Secondary | ICD-10-CM

## 2019-01-11 IMAGING — CR DG THORACIC SPINE 2V
3 series · 3 of 3 positions shown · non-contrast
Comparison: [DATE]

CLINICAL DATA: Fall with continued pain

EXAM:
THORACIC SPINE 2 VIEWS

[t-spine ap]
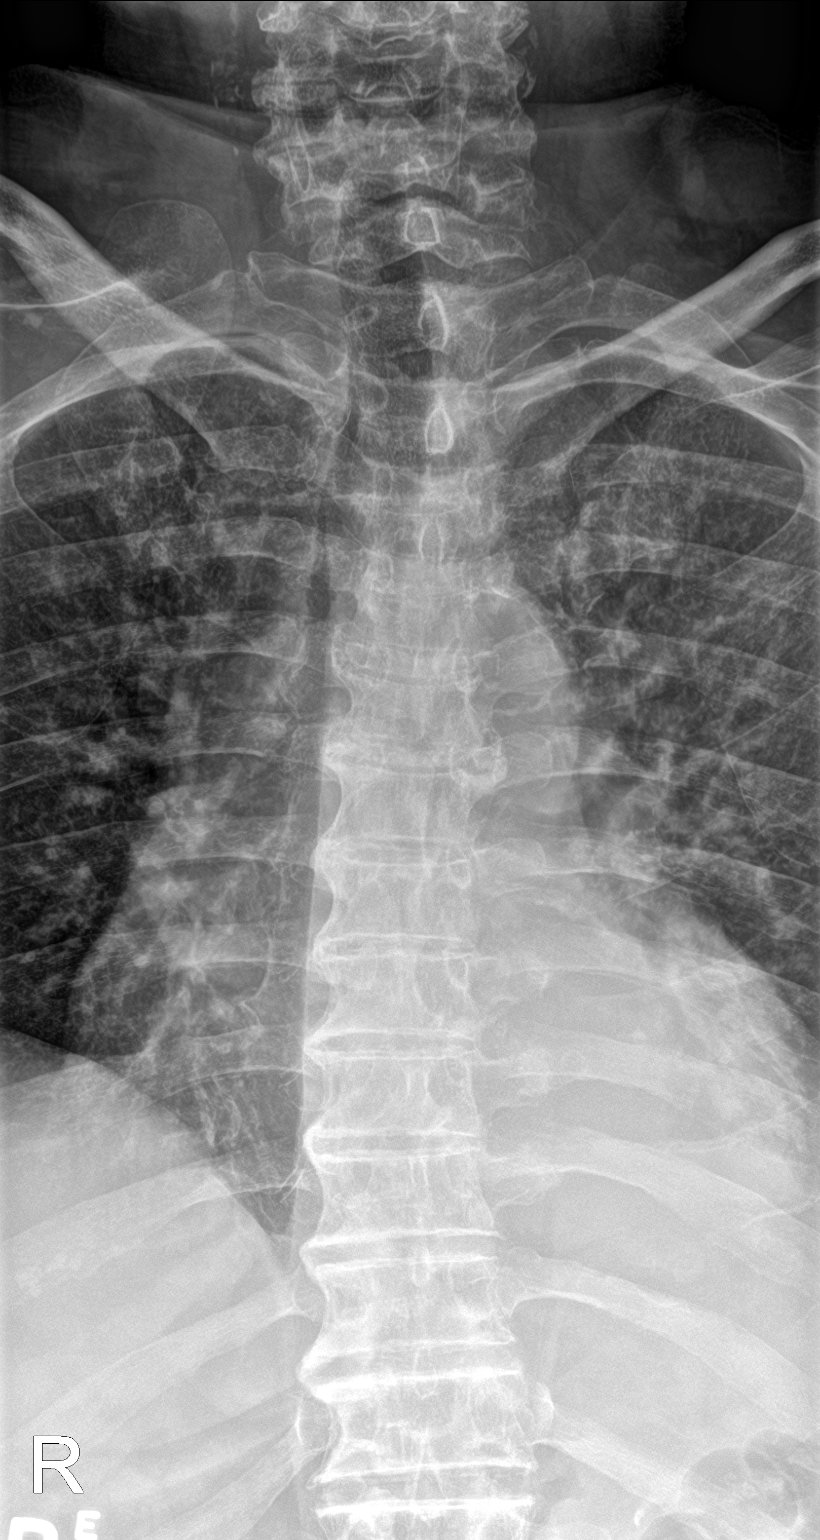

[t-spine lat]
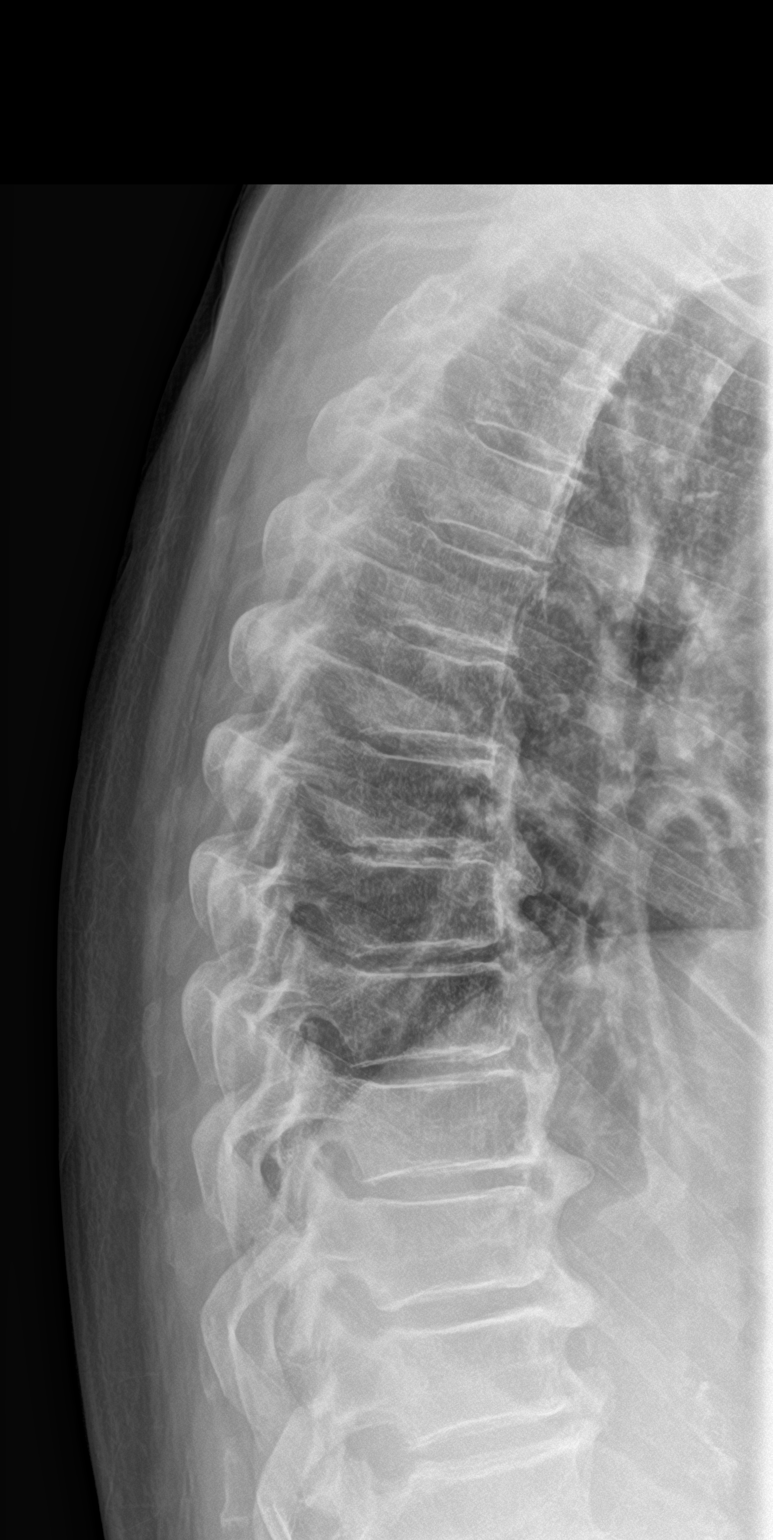

[t-spine swimmers]
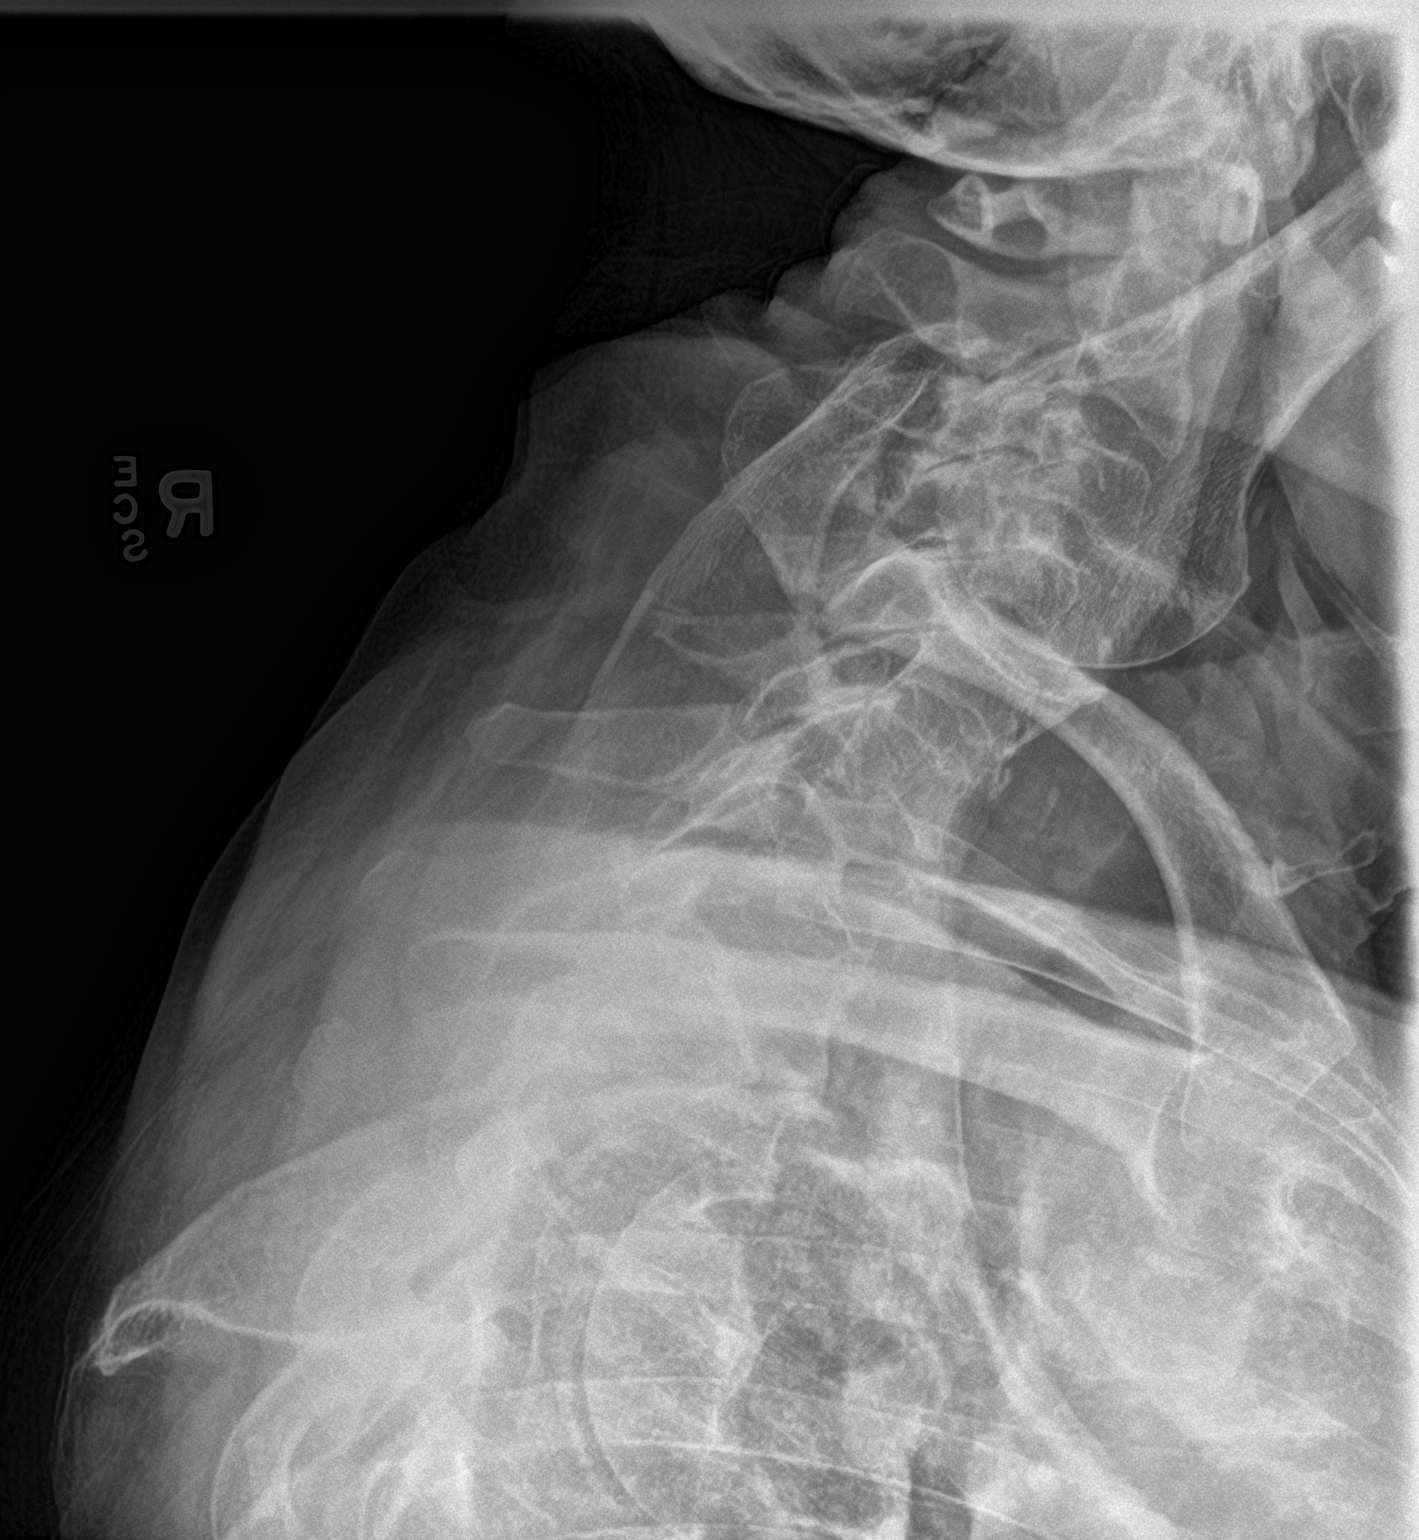

[3 of 3 positions shown; findings below may reference images not displayed]

FINDINGS: Thoracic alignment is within normal limits. Vertebral body heights
are grossly maintained. Diffuse degenerative osteophytes of the mid
to lower thoracic spine.
IMPRESSION: Diffuse degenerative changes. No definite acute osseous abnormality.

## 2019-01-21 ENCOUNTER — Other Ambulatory Visit: Payer: PPO

## 2019-01-23 ENCOUNTER — Other Ambulatory Visit: Payer: PPO | Admitting: *Deleted

## 2019-01-23 ENCOUNTER — Other Ambulatory Visit: Payer: Self-pay

## 2019-01-23 DIAGNOSIS — E785 Hyperlipidemia, unspecified: Secondary | ICD-10-CM

## 2019-01-23 DIAGNOSIS — I2510 Atherosclerotic heart disease of native coronary artery without angina pectoris: Secondary | ICD-10-CM | POA: Diagnosis not present

## 2019-01-23 DIAGNOSIS — I6523 Occlusion and stenosis of bilateral carotid arteries: Secondary | ICD-10-CM

## 2019-01-23 DIAGNOSIS — N183 Chronic kidney disease, stage 3 unspecified: Secondary | ICD-10-CM | POA: Diagnosis not present

## 2019-01-23 DIAGNOSIS — I10 Essential (primary) hypertension: Secondary | ICD-10-CM

## 2019-01-23 LAB — LIPID PANEL
Chol/HDL Ratio: 2.7 ratio (ref 0.0–5.0)
Cholesterol, Total: 167 mg/dL (ref 100–199)
HDL: 61 mg/dL (ref >39)
LDL Chol Calc (NIH): 93 mg/dL (ref 0–99)
Triglycerides: 69 mg/dL (ref 0–149)
VLDL Cholesterol Cal: 13 mg/dL (ref 5–40)

## 2019-01-23 LAB — HEPATIC FUNCTION PANEL
ALT: 28 IU/L (ref 0–44)
AST: 35 IU/L (ref 0–40)
Albumin: 4.2 g/dL (ref 3.6–4.6)
Alkaline Phosphatase: 101 IU/L (ref 39–117)
Bilirubin Total: 0.4 mg/dL (ref 0.0–1.2)
Bilirubin, Direct: 0.13 mg/dL (ref 0.00–0.40)
Total Protein: 6.1 g/dL (ref 6.0–8.5)

## 2019-01-26 ENCOUNTER — Telehealth: Payer: Self-pay

## 2019-01-26 ENCOUNTER — Telehealth: Payer: Self-pay | Admitting: Cardiovascular Disease

## 2019-01-26 DIAGNOSIS — E782 Mixed hyperlipidemia: Secondary | ICD-10-CM

## 2019-01-26 MED ORDER — ATORVASTATIN CALCIUM 80 MG PO TABS: 80.0000 mg | ORAL_TABLET | Freq: Every day | ORAL | 3 refills | Status: DC

## 2019-01-26 NOTE — Addendum Note (Signed)
Addended by: Trena Platt E on: 01/26/2019 12:29 PM   Modules accepted: Orders

## 2019-01-26 NOTE — Telephone Encounter (Signed)
Patient returning Amy's call in regards to lab results.

## 2019-01-26 NOTE — Telephone Encounter (Signed)
-----   Message from Darreld Mclean, PA-C sent at 01/26/2019  9:13 AM EST ----- LDL (bad cholesterol) still above goal of <70. If patient tolerating Lipitor OK, would like to increase to 80mg  daily. Can then recheck fasting lipid panel/LFTs in 3 months. Also recommend staying active and diet modification.  Thank you!

## 2019-01-26 NOTE — Telephone Encounter (Signed)
Patient returned call, lab results reviewed.  Recommendation provided, pt agreeable to increasing dose of Atorvastatin.  Updated rx sent to pharmacy.  Labs ordered and lab appt scheduled.  Diet discussed, pt indicates he is newly married and his new family eats a lot of red meat,  Cautioned to limit intake to lean cuts of red meat in moderation and try increasing lean fish intake.

## 2019-01-26 NOTE — Telephone Encounter (Signed)
LMTCB, advised results avail on mychart with recommendation to discuss

## 2019-02-03 ENCOUNTER — Telehealth: Payer: Self-pay | Admitting: Cardiovascular Disease

## 2019-02-03 DIAGNOSIS — E782 Mixed hyperlipidemia: Secondary | ICD-10-CM

## 2019-02-03 NOTE — Telephone Encounter (Signed)
Could try changing from atorvastatin to rosuvastatin 40mg  daily to see if pt tolerates this better. If he wishes to stay on lower dose of atorvastatin 40mg  daily, would recommend adding ezetimibe 10mg  daily.

## 2019-02-03 NOTE — Telephone Encounter (Signed)
Pt recently increased dose of Atorvastatin from 40mg  to 80mg  due to LDL 93, since increasing dose he has has severe muscle/ joint aches.  Pt will decrease dose back to 40mg  for now and report back if symptoms do not improve/ resolve within 2 weeks.  Advised would check with MD if he would like to change medication due to possible intolerance of high dose.

## 2019-02-03 NOTE — Telephone Encounter (Signed)
     Pt c/o medication issue:  1. Name of Medication: atorvastatin (LIPITOR) 80 MG tablet  2. How are you currently taking this medication (dosage and times per day)? As written  3. Are you having a reaction (difficulty breathing--STAT)? no  4. What is your medication issue? Severe joint pain

## 2019-02-09 ENCOUNTER — Other Ambulatory Visit: Payer: Self-pay | Admitting: *Deleted

## 2019-02-09 DIAGNOSIS — E782 Mixed hyperlipidemia: Secondary | ICD-10-CM

## 2019-02-09 MED ORDER — EZETIMIBE 10 MG PO TABS: 10.0000 mg | ORAL_TABLET | Freq: Every day | ORAL | 5 refills | Status: DC

## 2019-02-09 MED ORDER — ATORVASTATIN CALCIUM 40 MG PO TABS: 40.0000 mg | ORAL_TABLET | Freq: Every day | ORAL | 3 refills | Status: DC

## 2019-02-09 MED ORDER — ATORVASTATIN CALCIUM 40 MG PO TABS: 80.0000 mg | ORAL_TABLET | Freq: Every day | ORAL | 3 refills | Status: DC

## 2019-02-09 NOTE — Telephone Encounter (Signed)
Agree. thanks

## 2019-02-09 NOTE — Telephone Encounter (Signed)
Spoke with patient, since decreasing dosage back to 40mg , his symptoms have resolved.  Pt agreeable to adding on Zetia 10mg  daily for better cholesterol control.  He does not wish to try a different statin at this time.  Updated rx sent to pharmacy for Atorvastatin and new rx sent for Zetia 10mg .

## 2019-03-19 ENCOUNTER — Ambulatory Visit: Payer: PPO

## 2019-04-26 DIAGNOSIS — E782 Mixed hyperlipidemia: Secondary | ICD-10-CM | POA: Diagnosis not present

## 2019-04-27 ENCOUNTER — Other Ambulatory Visit: Payer: Self-pay

## 2019-04-27 ENCOUNTER — Other Ambulatory Visit: Payer: PPO | Admitting: *Deleted

## 2019-04-27 LAB — HEPATIC FUNCTION PANEL
ALT: 31 IU/L (ref 0–44)
AST: 40 IU/L (ref 0–40)
Albumin: 4.2 g/dL (ref 3.6–4.6)
Alkaline Phosphatase: 101 IU/L (ref 39–117)
Bilirubin Total: 0.5 mg/dL (ref 0.0–1.2)
Bilirubin, Direct: 0.17 mg/dL (ref 0.00–0.40)
Total Protein: 6.1 g/dL (ref 6.0–8.5)

## 2019-04-27 LAB — LIPID PANEL
Chol/HDL Ratio: 2.2 ratio (ref 0.0–5.0)
Cholesterol, Total: 129 mg/dL (ref 100–199)
HDL: 58 mg/dL (ref >39)
LDL Chol Calc (NIH): 58 mg/dL (ref 0–99)
Triglycerides: 62 mg/dL (ref 0–149)
VLDL Cholesterol Cal: 13 mg/dL (ref 5–40)

## 2019-04-28 ENCOUNTER — Other Ambulatory Visit: Payer: Self-pay | Admitting: Cardiovascular Disease

## 2019-04-28 ENCOUNTER — Telehealth: Payer: Self-pay | Admitting: Cardiovascular Disease

## 2019-04-28 DIAGNOSIS — E782 Mixed hyperlipidemia: Secondary | ICD-10-CM

## 2019-04-28 MED ORDER — ATORVASTATIN CALCIUM 40 MG PO TABS: 40.0000 mg | ORAL_TABLET | Freq: Every day | ORAL | 1 refills | Status: DC

## 2019-04-28 NOTE — Telephone Encounter (Signed)
Patient is requesting to have lab results mailed to him. Confirmed the address we have listed. Patient states that he does not have access to a computer to view his results.

## 2019-04-28 NOTE — Telephone Encounter (Signed)
Per DPR form, left detailed message with results on VM.  Results mailed to patient per request.

## 2019-04-28 NOTE — Telephone Encounter (Signed)
Patient calling back - he would like a nurse to go over the results with him.

## 2019-05-19 ENCOUNTER — Ambulatory Visit (INDEPENDENT_AMBULATORY_CARE_PROVIDER_SITE_OTHER): Payer: PPO | Admitting: Family Medicine

## 2019-05-19 ENCOUNTER — Other Ambulatory Visit: Payer: Self-pay

## 2019-05-19 ENCOUNTER — Encounter: Payer: Self-pay | Admitting: Family Medicine

## 2019-05-19 VITALS — BP 118/56 | HR 70 | Temp 96.9°F | Resp 14 | Ht 67.0 in | Wt 205.0 lb

## 2019-05-19 DIAGNOSIS — L600 Ingrowing nail: Secondary | ICD-10-CM

## 2019-05-19 NOTE — Progress Notes (Signed)
Subjective:    Patient ID: [name], male    DOB: 04/24/1933, 86 y.o.   MRN: [id number]  HPI  Patient is a very pleasant 86 year old Caucasian male here today for 3 concerns.  He has a lesion growing on his left ear that he just noticed.  It is a 4 to 5 mm brown macule with irregular borders.  It appears to be a solar lentigo however he states that it rapidly grew just recently.  He would like his biopsy.  He also has a warty papule on the dorsal surface of his right forearm that is approximately 7 mm in diameter.  This is bleeding frequently and appears to be a squamous cell carcinoma.  However he also complains of severe pain in his left great toenail.  The medial portion of the left great toenail is ingrown and tender.  He would like this removed as well.  I explained to the patient that we did not have time to perform all 3 procedures today.  Therefore he elects to have the ingrown toenail removed today and is rescheduled for later this week to have the skin biopsies performed of the 2 concerning skin lesions. Past Medical History:  Diagnosis Date  . Allergy   . BPH (benign prostatic hypertrophy)   . CAD (coronary artery disease)    s/p cypher DES to pLAD 6/08; normal LVF;  ETT-Myoview 2009: no ischemia   . Eosinophilia   . HTN (hypertension)   . Hyperlipidemia   . Myocardial infarction ([place])   . Prediabetes    Past Surgical History:  Procedure Laterality Date  . APPENDECTOMY    . CARDIAC CATHETERIZATION  07/30/2006   CORONARY ANGIOPLASTY WITH STENT PLACEMENT  . EXPLORATORY LAPAROTOMY     age 43   Current Outpatient Medications on File Prior to Visit  Medication Sig Dispense Refill  . amLODipine-benazepril (LOTREL) 5-20 MG capsule TAKE 1 CAPSULE BY MOUTH TWICE A DAY 180 capsule 3  . aspirin EC 81 MG tablet Take 1 tablet (81 mg total) by mouth daily.    Marland Kitchen atorvastatin (LIPITOR) 40 MG tablet Take 1 tablet (40 mg total) by mouth daily. 90 tablet 1  . Calcium  Carbonate-Vitamin D (CALCARB 600/D) 600-400 MG-UNIT per tablet Take 1 tablet by mouth daily.     . Coenzyme Q-10 100 MG capsule Take 100 mg by mouth daily.    Marland Kitchen doxazosin (CARDURA) 2 MG tablet TAKE 1 TABLET BY MOUTH EVERY DAY 90 tablet 3  . escitalopram (LEXAPRO) 10 MG tablet TAKE 1 TABLET BY MOUTH EVERY DAY 90 tablet 2  . ezetimibe (ZETIA) 10 MG tablet Take 1 tablet (10 mg total) by mouth daily. 30 tablet 5  . fluticasone (FLONASE) 50 MCG/ACT nasal spray USE 2 SPRAYS INTO BOTH NOSTRILS DAILY. 48 g 3  . Fluticasone-Salmeterol (ADVAIR DISKUS) 250-50 MCG/DOSE AEPB INHALE 1 PUFF INTO THE LUNGS 2 TIMES DAILY. 60 each 1  . NON FORMULARY Take 1 tablet by mouth 2 (two) times daily. Omega Q 1 tab twice daily     . PROAIR HFA 108 (90 Base) MCG/ACT inhaler INHALE 2 PUFFS INTO THE LUNGS EVERY 6 HOURS AS NEEDED FOR WHEEZING OR SHORTNESS OF BREATH 8.5 Inhaler 2  . traMADol (ULTRAM) 50 MG tablet Take 1 tablet (50 mg total) by mouth every 6 (six) hours as needed. 60 tablet 0   No current facility-administered medications on file prior to visit.   No Known Allergies Social History   Socioeconomic History  .  Marital status: Married    Spouse name: [name]  . Number of children: 1  . Years of education: Not on file  . Highest education level: Not on file  Occupational History  . Occupation: retired professor    Comment: History   . Occupation: missionary  Tobacco Use  . Smoking status: Former Research scientist (life sciences)  . Smokeless tobacco: Never Used  . Tobacco comment: quit over 64yrs ago.  Substance and Sexual Activity  . Alcohol use: Yes    Alcohol/week: 4.0 - 6.0 standard drinks    Types: 4 - 6 Glasses of wine per week  . Drug use: No  . Sexual activity: Yes    Partners: Female  Other Topics Concern  . Not on file  Social History Narrative   Lives with his wife (second marriage in 2015, first marriage ended when his wife died alzheimer's disease). Since his remarriage, his adult daughter doesn't speak with  him.   Social Determinants of Health   Financial Resource Strain:   . Difficulty of Paying Living Expenses:   Food Insecurity:   . Worried About Charity fundraiser in the Last Year:   . Arboriculturist in the Last Year:   Transportation Needs:   . Film/video editor (Medical):   Marland Kitchen Lack of Transportation (Non-Medical):   Physical Activity:   . Days of Exercise per Week:   . Minutes of Exercise per Session:   Stress:   . Feeling of Stress :   Social Connections:   . Frequency of Communication with Friends and Family:   . Frequency of Social Gatherings with Friends and Family:   . Attends Religious Services:   . Active Member of Clubs or Organizations:   . Attends Archivist Meetings:   Marland Kitchen Marital Status:   Intimate Partner Violence:   . Fear of Current or Ex-Partner:   . Emotionally Abused:   Marland Kitchen Physically Abused:   . Sexually Abused:      Review of Systems  All other systems reviewed and are negative.      Objective:   Physical Exam Vitals reviewed.  Constitutional:      Appearance: Normal appearance.  Cardiovascular:     Rate and Rhythm: Normal rate and regular rhythm.     Heart sounds: Normal heart sounds.  Pulmonary:     Effort: Pulmonary effort is normal.     Breath sounds: Normal breath sounds.  Musculoskeletal:     Left foot: Swelling and tenderness present.  Neurological:     Mental Status: He is alert.    Medial portion of the left great toenail is ingrown.  The surrounding skin is swollen and erythematous and tender to the touch.  Patient request removal       Assessment & Plan:  Ingrown toenail of left foot  Anesthesia was achieved using a digital block.  A tourniquet was applied to the base the toe.  A metal elevator was used to separate the ingrown portion of the toenail from the underlying nail bed back to the proximal nail fold.  A cut was then made using a pair of scissors back to the proximal nail fold and with a pair of  hemostats, the ingrown portion of the toenail was removed with gentle traction.  Neosporin was applied to the underlying nailbed, the toe was wrapped in petroleum gauze and then Coban.  Wound care was discussed.  Return later this week for biopsy of the concerning skin lesions.

## 2019-05-20 ENCOUNTER — Telehealth: Payer: Self-pay | Admitting: Family Medicine

## 2019-05-20 NOTE — Telephone Encounter (Signed)
Error.... patient has already talked to nurse.

## 2019-05-21 ENCOUNTER — Encounter: Payer: Self-pay | Admitting: Family Medicine

## 2019-05-21 ENCOUNTER — Other Ambulatory Visit: Payer: Self-pay

## 2019-05-21 ENCOUNTER — Ambulatory Visit (INDEPENDENT_AMBULATORY_CARE_PROVIDER_SITE_OTHER): Payer: PPO | Admitting: Family Medicine

## 2019-05-21 VITALS — BP 130/58 | HR 88 | Temp 97.1°F | Resp 16 | Ht 67.0 in | Wt 208.0 lb

## 2019-05-21 DIAGNOSIS — L989 Disorder of the skin and subcutaneous tissue, unspecified: Secondary | ICD-10-CM

## 2019-05-21 DIAGNOSIS — D485 Neoplasm of uncertain behavior of skin: Secondary | ICD-10-CM

## 2019-05-21 DIAGNOSIS — C801 Malignant (primary) neoplasm, unspecified: Secondary | ICD-10-CM | POA: Diagnosis not present

## 2019-05-21 DIAGNOSIS — B079 Viral wart, unspecified: Secondary | ICD-10-CM | POA: Diagnosis not present

## 2019-05-21 NOTE — Progress Notes (Signed)
Subjective:    Patient ID: [name], male    DOB: 07-14-1933, 86 y.o.   MRN: [id number]  HPI  Patient is a very pleasant 86 year old Caucasian male here today for 3 concerns.  He has a lesion growing on his left ear that he just noticed.  It is a 4 to 5 mm brown macule with irregular borders.  It appears to be a solar lentigo however he states that it rapidly grew just recently.   He also has a warty papule on the dorsal surface of his right forearm that is approximately 7 mm in diameter.  This is bleeding frequently and appears to be a squamous cell carcinoma.  He also has a lesion in the center of his back roughly at the level of T7.  This lesion is approximately 1.2 cm in diameter.  It is an erythematous macule covered with white scale.  There are indistinct borders.  I suspect an actinic keratosis. Past Medical History:  Diagnosis Date  . Allergy   . BPH (benign prostatic hypertrophy)   . CAD (coronary artery disease)    s/p cypher DES to pLAD 6/08; normal LVF;  ETT-Myoview 2009: no ischemia   . Eosinophilia   . HTN (hypertension)   . Hyperlipidemia   . Myocardial infarction ([place])   . Prediabetes    Past Surgical History:  Procedure Laterality Date  . APPENDECTOMY    . CARDIAC CATHETERIZATION  07/30/2006   CORONARY ANGIOPLASTY WITH STENT PLACEMENT  . EXPLORATORY LAPAROTOMY     age 63   Current Outpatient Medications on File Prior to Visit  Medication Sig Dispense Refill  . amLODipine-benazepril (LOTREL) 5-20 MG capsule TAKE 1 CAPSULE BY MOUTH TWICE A DAY 180 capsule 3  . aspirin EC 81 MG tablet Take 1 tablet (81 mg total) by mouth daily.    Marland Kitchen atorvastatin (LIPITOR) 40 MG tablet Take 1 tablet (40 mg total) by mouth daily. 90 tablet 1  . Calcium Carbonate-Vitamin D (CALCARB 600/D) 600-400 MG-UNIT per tablet Take 1 tablet by mouth daily.     . Coenzyme Q-10 100 MG capsule Take 100 mg by mouth daily.    Marland Kitchen doxazosin (CARDURA) 2 MG tablet TAKE 1 TABLET BY MOUTH EVERY DAY 90  tablet 3  . escitalopram (LEXAPRO) 10 MG tablet TAKE 1 TABLET BY MOUTH EVERY DAY 90 tablet 2  . ezetimibe (ZETIA) 10 MG tablet Take 1 tablet (10 mg total) by mouth daily. 30 tablet 5  . fluticasone (FLONASE) 50 MCG/ACT nasal spray USE 2 SPRAYS INTO BOTH NOSTRILS DAILY. 48 g 3  . Fluticasone-Salmeterol (ADVAIR DISKUS) 250-50 MCG/DOSE AEPB INHALE 1 PUFF INTO THE LUNGS 2 TIMES DAILY. 60 each 1  . NON FORMULARY Take 1 tablet by mouth 2 (two) times daily. Omega Q 1 tab twice daily     . PROAIR HFA 108 (90 Base) MCG/ACT inhaler INHALE 2 PUFFS INTO THE LUNGS EVERY 6 HOURS AS NEEDED FOR WHEEZING OR SHORTNESS OF BREATH 8.5 Inhaler 2  . traMADol (ULTRAM) 50 MG tablet Take 1 tablet (50 mg total) by mouth every 6 (six) hours as needed. 60 tablet 0   No current facility-administered medications on file prior to visit.   No Known Allergies Social History   Socioeconomic History  . Marital status: Married    Spouse name: [name]  . Number of children: 1  . Years of education: Not on file  . Highest education level: Not on file  Occupational History  . Occupation: retired  professor    Comment: History   . Occupation: missionary  Tobacco Use  . Smoking status: Former Research scientist (life sciences)  . Smokeless tobacco: Never Used  . Tobacco comment: quit over 35yrs ago.  Substance and Sexual Activity  . Alcohol use: Yes    Alcohol/week: 4.0 - 6.0 standard drinks    Types: 4 - 6 Glasses of wine per week  . Drug use: No  . Sexual activity: Yes    Partners: Female  Other Topics Concern  . Not on file  Social History Narrative   Lives with his wife (second marriage in 2015, first marriage ended when his wife died alzheimer's disease). Since his remarriage, his adult daughter doesn't speak with him.   Social Determinants of Health   Financial Resource Strain:   . Difficulty of Paying Living Expenses:   Food Insecurity:   . Worried About Charity fundraiser in the Last Year:   . Arboriculturist in the Last Year:     Transportation Needs:   . Film/video editor (Medical):   Marland Kitchen Lack of Transportation (Non-Medical):   Physical Activity:   . Days of Exercise per Week:   . Minutes of Exercise per Session:   Stress:   . Feeling of Stress :   Social Connections:   . Frequency of Communication with Friends and Family:   . Frequency of Social Gatherings with Friends and Family:   . Attends Religious Services:   . Active Member of Clubs or Organizations:   . Attends Archivist Meetings:   Marland Kitchen Marital Status:   Intimate Partner Violence:   . Fear of Current or Ex-Partner:   . Emotionally Abused:   Marland Kitchen Physically Abused:   . Sexually Abused:      Review of Systems  All other systems reviewed and are negative.      Objective:   Physical Exam Vitals reviewed.  Constitutional:      Appearance: Normal appearance.  Cardiovascular:     Rate and Rhythm: Normal rate and regular rhythm.     Heart sounds: Normal heart sounds.  Pulmonary:     Effort: Pulmonary effort is normal.     Breath sounds: Normal breath sounds.  Neurological:     Mental Status: He is alert.             Assessment & Plan:  Malignant neoplasm of unknown origin [facility]) - Plan: Pathology, Pathology  Skin lesion of back  I believe the lesion on his left ear is a solar lentigo.  I do not see any characteristics that have me concerned about melanoma.  We discussed biopsying this today and the patient would elect simply to watch it for the foreseeable future.  If it grows or changes at any time we can certainly biopsy at.  I am concerned about a lesion on his right forearm.  I believe this is likely a squamous cell carcinoma versus an inflamed seborrheic keratosis.  That lesion was anesthetized with 0.1% lidocaine with epinephrine.  A shave biopsy was performed of the lesion in its entirety.  Hemostasis was achieved with Drysol and a Band-Aid.  The lesion was sent to pathology in a labeled container.  The lesion in the  center of his back appears to be a 1.2 cm actinic keratosis versus squamous cell carcinoma.  I anesthetized that lesion was 0.1% lidocaine with epinephrine.  I performed a shave biopsy of this lesion in its entirety and sent the lesion to pathology  in a labeled container.  Hemostasis was achieved with Drysol and a Band-Aid.  Await the results of the skin biopsy.

## 2019-05-25 ENCOUNTER — Other Ambulatory Visit: Payer: Self-pay | Admitting: Family Medicine

## 2019-05-25 LAB — TISSUE PATH REPORT

## 2019-05-25 LAB — PATHOLOGY REPORT

## 2019-06-07 ENCOUNTER — Other Ambulatory Visit: Payer: Self-pay | Admitting: Family Medicine

## 2019-06-08 ENCOUNTER — Telehealth: Payer: Self-pay | Admitting: Family Medicine

## 2019-06-08 NOTE — Chronic Care Management (AMB) (Signed)
  Chronic Care Management   Outreach Note  06/08/2019 Name: KAYSEAN LITTLETON MRN: [id number] DOB: December 21, 1933  Referred by: [name], MD Reason for referral : Chronic Care Management   An unsuccessful telephone outreach was attempted today. The patient was referred to the pharmacist for assistance with care management and care coordination.   Follow Up Plan:   [facility]

## 2019-06-29 ENCOUNTER — Telehealth: Payer: Self-pay | Admitting: Family Medicine

## 2019-06-29 NOTE — Progress Notes (Signed)
  Chronic Care Management   Outreach Note  06/29/2019 Name: [name] MRN: [id number] DOB: [birth year]  Referred by: [name], MD Reason for referral : No chief complaint on file.   A second unsuccessful telephone outreach was attempted today. The patient was referred to pharmacist for assistance with care management and care coordination.  This note is not being shared with the patient for the following reason: To respect privacy (The patient or proxy has requested that the information not be shared).  Follow Up Plan:   [facility],  Ilda Foil

## 2019-07-07 ENCOUNTER — Telehealth: Payer: Self-pay | Admitting: Family Medicine

## 2019-07-07 ENCOUNTER — Other Ambulatory Visit: Payer: Self-pay | Admitting: Family Medicine

## 2019-07-07 MED ORDER — GABAPENTIN 300 MG PO CAPS
300.0000 mg | ORAL_CAPSULE | Freq: Three times a day (TID) | ORAL | 3 refills | Status: DC
Start: 2019-07-07 — End: 2019-08-07

## 2019-07-07 NOTE — Telephone Encounter (Signed)
No that would be fine to use for the neuralgia. I will send it in.

## 2019-07-07 NOTE — Telephone Encounter (Signed)
Patient states that he is having itching in his ears that he states that it comes from neuralgia. The itching in his ears keeps him up at night. He states that the Gabapentin helps. However he says if you recommend anything else he is willing to try it. Please advise?

## 2019-07-07 NOTE — Telephone Encounter (Signed)
This is used for nerve pain, I don't recall giving him that for itching.  Where is he itching?

## 2019-07-07 NOTE — Telephone Encounter (Signed)
Patient called in requesting a new prescription for Gabapentin 300 take 1 tablet 3 times daily. States that he takes it for itching. Is it ok to refill?

## 2019-07-07 NOTE — Telephone Encounter (Signed)
Last refilled

## 2019-07-07 NOTE — Telephone Encounter (Signed)
Spoke with patient and informed him that Gabapentin was called into the pharmacy. Patient verbalized understanding.

## 2019-07-14 ENCOUNTER — Emergency Department (HOSPITAL_COMMUNITY): Payer: PPO

## 2019-07-14 ENCOUNTER — Encounter (HOSPITAL_COMMUNITY): Payer: Self-pay | Admitting: Emergency Medicine

## 2019-07-14 ENCOUNTER — Inpatient Hospital Stay (HOSPITAL_COMMUNITY)
Admission: EM | Admit: 2019-07-14 | Discharge: 2019-07-23 | DRG: 964 | Disposition: A | Discharge status: Rehabilitation Facility | Payer: PPO | Attending: General Surgery | Admitting: General Surgery

## 2019-07-14 ENCOUNTER — Other Ambulatory Visit: Payer: Self-pay

## 2019-07-14 DIAGNOSIS — R296 Repeated falls: Secondary | ICD-10-CM | POA: Diagnosis present

## 2019-07-14 DIAGNOSIS — M7989 Other specified soft tissue disorders: Secondary | ICD-10-CM | POA: Diagnosis not present

## 2019-07-14 DIAGNOSIS — R7989 Other specified abnormal findings of blood chemistry: Secondary | ICD-10-CM | POA: Diagnosis not present

## 2019-07-14 DIAGNOSIS — Z7982 Long term (current) use of aspirin: Secondary | ICD-10-CM | POA: Diagnosis not present

## 2019-07-14 DIAGNOSIS — Z888 Allergy status to other drugs, medicaments and biological substances status: Secondary | ICD-10-CM | POA: Diagnosis not present

## 2019-07-14 DIAGNOSIS — R079 Chest pain, unspecified: Secondary | ICD-10-CM | POA: Diagnosis not present

## 2019-07-14 DIAGNOSIS — M5136 Other intervertebral disc degeneration, lumbar region: Secondary | ICD-10-CM | POA: Diagnosis present

## 2019-07-14 DIAGNOSIS — S069X0D Unspecified intracranial injury without loss of consciousness, subsequent encounter: Secondary | ICD-10-CM | POA: Diagnosis not present

## 2019-07-14 DIAGNOSIS — D696 Thrombocytopenia, unspecified: Secondary | ICD-10-CM | POA: Diagnosis not present

## 2019-07-14 DIAGNOSIS — R7303 Prediabetes: Secondary | ICD-10-CM | POA: Diagnosis not present

## 2019-07-14 DIAGNOSIS — N179 Acute kidney failure, unspecified: Secondary | ICD-10-CM | POA: Diagnosis present

## 2019-07-14 DIAGNOSIS — W19XXXA Unspecified fall, initial encounter: Secondary | ICD-10-CM | POA: Diagnosis present

## 2019-07-14 DIAGNOSIS — D62 Acute posthemorrhagic anemia: Secondary | ICD-10-CM | POA: Diagnosis present

## 2019-07-14 DIAGNOSIS — I252 Old myocardial infarction: Secondary | ICD-10-CM

## 2019-07-14 DIAGNOSIS — S069X9S Unspecified intracranial injury with loss of consciousness of unspecified duration, sequela: Secondary | ICD-10-CM | POA: Diagnosis not present

## 2019-07-14 DIAGNOSIS — M4802 Spinal stenosis, cervical region: Secondary | ICD-10-CM | POA: Diagnosis present

## 2019-07-14 DIAGNOSIS — R519 Headache, unspecified: Secondary | ICD-10-CM | POA: Diagnosis not present

## 2019-07-14 DIAGNOSIS — I951 Orthostatic hypotension: Secondary | ICD-10-CM | POA: Diagnosis not present

## 2019-07-14 DIAGNOSIS — S066X9A Traumatic subarachnoid hemorrhage with loss of consciousness of unspecified duration, initial encounter: Principal | ICD-10-CM | POA: Diagnosis present

## 2019-07-14 DIAGNOSIS — K5900 Constipation, unspecified: Secondary | ICD-10-CM | POA: Diagnosis not present

## 2019-07-14 DIAGNOSIS — S065XAA Traumatic subdural hemorrhage with loss of consciousness status unknown, initial encounter: Secondary | ICD-10-CM

## 2019-07-14 DIAGNOSIS — T07XXXA Unspecified multiple injuries, initial encounter: Secondary | ICD-10-CM | POA: Diagnosis not present

## 2019-07-14 DIAGNOSIS — S62502A Fracture of unspecified phalanx of left thumb, initial encounter for closed fracture: Secondary | ICD-10-CM | POA: Diagnosis not present

## 2019-07-14 DIAGNOSIS — I129 Hypertensive chronic kidney disease with stage 1 through stage 4 chronic kidney disease, or unspecified chronic kidney disease: Secondary | ICD-10-CM | POA: Diagnosis not present

## 2019-07-14 DIAGNOSIS — Z87891 Personal history of nicotine dependence: Secondary | ICD-10-CM | POA: Diagnosis not present

## 2019-07-14 DIAGNOSIS — E1122 Type 2 diabetes mellitus with diabetic chronic kidney disease: Secondary | ICD-10-CM | POA: Diagnosis not present

## 2019-07-14 DIAGNOSIS — E785 Hyperlipidemia, unspecified: Secondary | ICD-10-CM | POA: Diagnosis present

## 2019-07-14 DIAGNOSIS — I10 Essential (primary) hypertension: Secondary | ICD-10-CM | POA: Diagnosis not present

## 2019-07-14 DIAGNOSIS — S2242XA Multiple fractures of ribs, left side, initial encounter for closed fracture: Secondary | ICD-10-CM | POA: Diagnosis not present

## 2019-07-14 DIAGNOSIS — S42032A Displaced fracture of lateral end of left clavicle, initial encounter for closed fracture: Secondary | ICD-10-CM | POA: Diagnosis not present

## 2019-07-14 DIAGNOSIS — R0902 Hypoxemia: Secondary | ICD-10-CM | POA: Diagnosis not present

## 2019-07-14 DIAGNOSIS — R339 Retention of urine, unspecified: Secondary | ICD-10-CM | POA: Diagnosis not present

## 2019-07-14 DIAGNOSIS — N189 Chronic kidney disease, unspecified: Secondary | ICD-10-CM | POA: Diagnosis not present

## 2019-07-14 DIAGNOSIS — S0990XA Unspecified injury of head, initial encounter: Secondary | ICD-10-CM

## 2019-07-14 DIAGNOSIS — N401 Enlarged prostate with lower urinary tract symptoms: Secondary | ICD-10-CM | POA: Diagnosis present

## 2019-07-14 DIAGNOSIS — W109XXA Fall (on) (from) unspecified stairs and steps, initial encounter: Secondary | ICD-10-CM | POA: Diagnosis present

## 2019-07-14 DIAGNOSIS — M25572 Pain in left ankle and joints of left foot: Secondary | ICD-10-CM | POA: Diagnosis not present

## 2019-07-14 DIAGNOSIS — S42032D Displaced fracture of lateral end of left clavicle, subsequent encounter for fracture with routine healing: Secondary | ICD-10-CM | POA: Diagnosis not present

## 2019-07-14 DIAGNOSIS — S99912A Unspecified injury of left ankle, initial encounter: Secondary | ICD-10-CM | POA: Diagnosis not present

## 2019-07-14 DIAGNOSIS — S069X9A Unspecified intracranial injury with loss of consciousness of unspecified duration, initial encounter: Secondary | ICD-10-CM

## 2019-07-14 DIAGNOSIS — J939 Pneumothorax, unspecified: Secondary | ICD-10-CM

## 2019-07-14 DIAGNOSIS — S065X9A Traumatic subdural hemorrhage with loss of consciousness of unspecified duration, initial encounter: Secondary | ICD-10-CM | POA: Diagnosis not present

## 2019-07-14 DIAGNOSIS — S42034A Nondisplaced fracture of lateral end of right clavicle, initial encounter for closed fracture: Secondary | ICD-10-CM | POA: Diagnosis not present

## 2019-07-14 DIAGNOSIS — G3184 Mild cognitive impairment, so stated: Secondary | ICD-10-CM | POA: Diagnosis not present

## 2019-07-14 DIAGNOSIS — M542 Cervicalgia: Secondary | ICD-10-CM | POA: Diagnosis not present

## 2019-07-14 DIAGNOSIS — T1490XA Injury, unspecified, initial encounter: Secondary | ICD-10-CM

## 2019-07-14 DIAGNOSIS — S42002S Fracture of unspecified part of left clavicle, sequela: Secondary | ICD-10-CM | POA: Diagnosis not present

## 2019-07-14 DIAGNOSIS — S0003XD Contusion of scalp, subsequent encounter: Secondary | ICD-10-CM | POA: Diagnosis not present

## 2019-07-14 DIAGNOSIS — S270XXD Traumatic pneumothorax, subsequent encounter: Secondary | ICD-10-CM | POA: Diagnosis not present

## 2019-07-14 DIAGNOSIS — N400 Enlarged prostate without lower urinary tract symptoms: Secondary | ICD-10-CM

## 2019-07-14 DIAGNOSIS — S42009A Fracture of unspecified part of unspecified clavicle, initial encounter for closed fracture: Secondary | ICD-10-CM | POA: Diagnosis not present

## 2019-07-14 DIAGNOSIS — Z9181 History of falling: Secondary | ICD-10-CM

## 2019-07-14 DIAGNOSIS — S37012A Minor contusion of left kidney, initial encounter: Secondary | ICD-10-CM | POA: Diagnosis present

## 2019-07-14 DIAGNOSIS — M546 Pain in thoracic spine: Secondary | ICD-10-CM | POA: Diagnosis not present

## 2019-07-14 DIAGNOSIS — S066X0A Traumatic subarachnoid hemorrhage without loss of consciousness, initial encounter: Secondary | ICD-10-CM | POA: Diagnosis not present

## 2019-07-14 DIAGNOSIS — S42035A Nondisplaced fracture of lateral end of left clavicle, initial encounter for closed fracture: Secondary | ICD-10-CM | POA: Diagnosis not present

## 2019-07-14 DIAGNOSIS — F05 Delirium due to known physiological condition: Secondary | ICD-10-CM | POA: Diagnosis not present

## 2019-07-14 DIAGNOSIS — R413 Other amnesia: Secondary | ICD-10-CM | POA: Diagnosis present

## 2019-07-14 DIAGNOSIS — S62611D Displaced fracture of proximal phalanx of left index finger, subsequent encounter for fracture with routine healing: Secondary | ICD-10-CM | POA: Diagnosis not present

## 2019-07-14 DIAGNOSIS — E7800 Pure hypercholesterolemia, unspecified: Secondary | ICD-10-CM | POA: Diagnosis present

## 2019-07-14 DIAGNOSIS — I959 Hypotension, unspecified: Secondary | ICD-10-CM | POA: Diagnosis not present

## 2019-07-14 DIAGNOSIS — S63105A Unspecified dislocation of left thumb, initial encounter: Secondary | ICD-10-CM | POA: Diagnosis not present

## 2019-07-14 DIAGNOSIS — S301XXA Contusion of abdominal wall, initial encounter: Secondary | ICD-10-CM | POA: Diagnosis present

## 2019-07-14 DIAGNOSIS — S069X2S Unspecified intracranial injury with loss of consciousness of 31 minutes to 59 minutes, sequela: Secondary | ICD-10-CM | POA: Diagnosis not present

## 2019-07-14 DIAGNOSIS — S0001XA Abrasion of scalp, initial encounter: Secondary | ICD-10-CM | POA: Diagnosis present

## 2019-07-14 DIAGNOSIS — S3992XA Unspecified injury of lower back, initial encounter: Secondary | ICD-10-CM | POA: Diagnosis not present

## 2019-07-14 DIAGNOSIS — S270XXA Traumatic pneumothorax, initial encounter: Secondary | ICD-10-CM | POA: Diagnosis not present

## 2019-07-14 DIAGNOSIS — Z20822 Contact with and (suspected) exposure to covid-19: Secondary | ICD-10-CM | POA: Diagnosis not present

## 2019-07-14 DIAGNOSIS — Z955 Presence of coronary angioplasty implant and graft: Secondary | ICD-10-CM | POA: Diagnosis not present

## 2019-07-14 DIAGNOSIS — S62512D Displaced fracture of proximal phalanx of left thumb, subsequent encounter for fracture with routine healing: Secondary | ICD-10-CM | POA: Diagnosis not present

## 2019-07-14 DIAGNOSIS — S62522A Displaced fracture of distal phalanx of left thumb, initial encounter for closed fracture: Secondary | ICD-10-CM | POA: Diagnosis not present

## 2019-07-14 DIAGNOSIS — I609 Nontraumatic subarachnoid hemorrhage, unspecified: Secondary | ICD-10-CM | POA: Insufficient documentation

## 2019-07-14 DIAGNOSIS — Z8249 Family history of ischemic heart disease and other diseases of the circulatory system: Secondary | ICD-10-CM

## 2019-07-14 DIAGNOSIS — R404 Transient alteration of awareness: Secondary | ICD-10-CM | POA: Diagnosis not present

## 2019-07-14 DIAGNOSIS — S299XXA Unspecified injury of thorax, initial encounter: Secondary | ICD-10-CM | POA: Diagnosis not present

## 2019-07-14 DIAGNOSIS — S301XXD Contusion of abdominal wall, subsequent encounter: Secondary | ICD-10-CM | POA: Diagnosis not present

## 2019-07-14 DIAGNOSIS — R338 Other retention of urine: Secondary | ICD-10-CM | POA: Diagnosis not present

## 2019-07-14 DIAGNOSIS — I2510 Atherosclerotic heart disease of native coronary artery without angina pectoris: Secondary | ICD-10-CM | POA: Diagnosis present

## 2019-07-14 DIAGNOSIS — N289 Disorder of kidney and ureter, unspecified: Secondary | ICD-10-CM | POA: Diagnosis not present

## 2019-07-14 DIAGNOSIS — S199XXA Unspecified injury of neck, initial encounter: Secondary | ICD-10-CM | POA: Diagnosis not present

## 2019-07-14 DIAGNOSIS — S066X9D Traumatic subarachnoid hemorrhage with loss of consciousness of unspecified duration, subsequent encounter: Secondary | ICD-10-CM | POA: Diagnosis not present

## 2019-07-14 DIAGNOSIS — S069X3S Unspecified intracranial injury with loss of consciousness of 1 hour to 5 hours 59 minutes, sequela: Secondary | ICD-10-CM | POA: Diagnosis not present

## 2019-07-14 DIAGNOSIS — S63125A Dislocation of unspecified interphalangeal joint of left thumb, initial encounter: Secondary | ICD-10-CM | POA: Diagnosis not present

## 2019-07-14 DIAGNOSIS — R319 Hematuria, unspecified: Secondary | ICD-10-CM | POA: Diagnosis not present

## 2019-07-14 DIAGNOSIS — M5030 Other cervical disc degeneration, unspecified cervical region: Secondary | ICD-10-CM | POA: Diagnosis present

## 2019-07-14 DIAGNOSIS — S065X0A Traumatic subdural hemorrhage without loss of consciousness, initial encounter: Secondary | ICD-10-CM | POA: Diagnosis not present

## 2019-07-14 DIAGNOSIS — M48061 Spinal stenosis, lumbar region without neurogenic claudication: Secondary | ICD-10-CM | POA: Diagnosis present

## 2019-07-14 DIAGNOSIS — N1832 Chronic kidney disease, stage 3b: Secondary | ICD-10-CM | POA: Diagnosis present

## 2019-07-14 DIAGNOSIS — S3993XA Unspecified injury of pelvis, initial encounter: Secondary | ICD-10-CM | POA: Diagnosis not present

## 2019-07-14 DIAGNOSIS — S2242XD Multiple fractures of ribs, left side, subsequent encounter for fracture with routine healing: Secondary | ICD-10-CM | POA: Diagnosis not present

## 2019-07-14 DIAGNOSIS — S069X6D Unspecified intracranial injury with loss of consciousness greater than 24 hours without return to pre-existing conscious level with patient surviving, subsequent encounter: Secondary | ICD-10-CM | POA: Diagnosis not present

## 2019-07-14 DIAGNOSIS — R4020 Unspecified coma: Secondary | ICD-10-CM | POA: Diagnosis not present

## 2019-07-14 DIAGNOSIS — Z800 Family history of malignant neoplasm of digestive organs: Secondary | ICD-10-CM

## 2019-07-14 HISTORY — DX: Benign prostatic hyperplasia without lower urinary tract symptoms: N40.0

## 2019-07-14 HISTORY — DX: Acute myocardial infarction, unspecified: I21.9

## 2019-07-14 HISTORY — DX: Nontraumatic subarachnoid hemorrhage, unspecified: I60.9

## 2019-07-14 HISTORY — DX: Unspecified intracranial injury with loss of consciousness of unspecified duration, initial encounter: S06.9X9A

## 2019-07-14 HISTORY — DX: Atherosclerotic heart disease of native coronary artery without angina pectoris: I25.10

## 2019-07-14 HISTORY — DX: Pure hypercholesterolemia, unspecified: E78.00

## 2019-07-14 HISTORY — DX: Trigeminal neuralgia: G50.0

## 2019-07-14 HISTORY — DX: Essential (primary) hypertension: I10

## 2019-07-14 LAB — CBC
HCT: 35.1 % — ABNORMAL LOW (ref 39.0–52.0)
Hemoglobin: 11.4 g/dL — ABNORMAL LOW (ref 13.0–17.0)
MCH: 32.2 pg (ref 26.0–34.0)
MCHC: 32.5 g/dL (ref 30.0–36.0)
MCV: 99.2 fL (ref 80.0–100.0)
Platelets: 194 10*3/uL (ref 150–400)
RBC: 3.54 MIL/uL — ABNORMAL LOW (ref 4.22–5.81)
RDW: 12.7 % (ref 11.5–15.5)
WBC: 6.7 10*3/uL (ref 4.0–10.5)
nRBC: 0 % (ref 0.0–0.2)

## 2019-07-14 LAB — I-STAT CHEM 8, ED
BUN: 36 mg/dL — ABNORMAL HIGH (ref 8–23)
Calcium, Ion: 1.06 mmol/L — ABNORMAL LOW (ref 1.15–1.40)
Chloride: 103 mmol/L (ref 98–111)
Creatinine, Ser: 2.1 mg/dL — ABNORMAL HIGH (ref 0.61–1.24)
Glucose, Bld: 154 mg/dL — ABNORMAL HIGH (ref 70–99)
HCT: 32 % — ABNORMAL LOW (ref 39.0–52.0)
Hemoglobin: 10.9 g/dL — ABNORMAL LOW (ref 13.0–17.0)
Potassium: 4.3 mmol/L (ref 3.5–5.1)
Sodium: 139 mmol/L (ref 135–145)
TCO2: 25 mmol/L (ref 22–32)

## 2019-07-14 LAB — COMPREHENSIVE METABOLIC PANEL
ALT: 27 U/L (ref 0–44)
AST: 39 U/L (ref 15–41)
Albumin: 3.5 g/dL (ref 3.5–5.0)
Alkaline Phosphatase: 71 U/L (ref 38–126)
Anion gap: 11 (ref 5–15)
BUN: 33 mg/dL — ABNORMAL HIGH (ref 8–23)
CO2: 23 mmol/L (ref 22–32)
Calcium: 9 mg/dL (ref 8.9–10.3)
Chloride: 105 mmol/L (ref 98–111)
Creatinine, Ser: 1.97 mg/dL — ABNORMAL HIGH (ref 0.61–1.24)
GFR calc Af Amer: 35 mL/min — ABNORMAL LOW (ref >60)
GFR calc non Af Amer: 31 mL/min — ABNORMAL LOW (ref >60)
Glucose, Bld: 161 mg/dL — ABNORMAL HIGH (ref 70–99)
Potassium: 4.5 mmol/L (ref 3.5–5.1)
Sodium: 139 mmol/L (ref 135–145)
Total Bilirubin: 0.7 mg/dL (ref 0.3–1.2)
Total Protein: 6.2 g/dL — ABNORMAL LOW (ref 6.5–8.1)

## 2019-07-14 LAB — SARS CORONAVIRUS 2 BY RT PCR (HOSPITAL ORDER, PERFORMED IN ~~LOC~~ HOSPITAL LAB): SARS Coronavirus 2: NEGATIVE

## 2019-07-14 LAB — ETHANOL: Alcohol, Ethyl (B): <10 mg/dL (ref <10)

## 2019-07-14 LAB — PROTIME-INR
INR: 1 (ref 0.8–1.2)
Prothrombin Time: 12.8 seconds (ref 11.4–15.2)

## 2019-07-14 LAB — SAMPLE TO BLOOD BANK

## 2019-07-14 LAB — LACTIC ACID, PLASMA: Lactic Acid, Venous: 1 mmol/L (ref 0.5–1.9)

## 2019-07-14 IMAGING — CT CT CERVICAL SPINE W/O CM
4 series · 14 of 35 positions shown, 17 images · non-contrast
Comparison: None.

CLINICAL DATA: Status post fall down stairs today. Initial
encounter.

EXAM:
CT CERVICAL SPINE WITHOUT CONTRAST
TECHNIQUE: Multidetector CT imaging of the cervical spine was performed without
intravenous contrast. Multiplanar CT image reconstructions were also
generated.

[Series 5: c spine soft · axial · 0.29mm/px · 1 of 90 slices shown]
[im 15/90  soft-tissue]
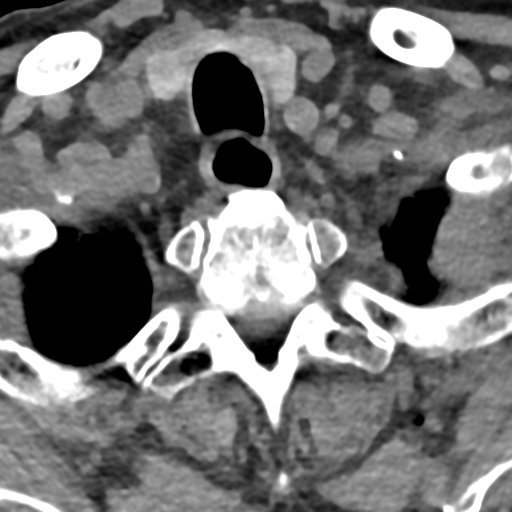

[Series 8: sag bone · sagittal · 0.31mm/px · 5 of 89 slices shown, 6 images]
[im 30/89  bone]
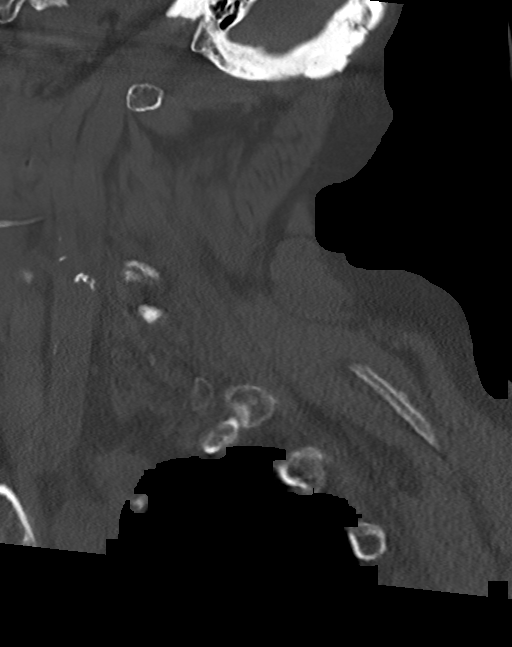
[im 37/89  bone]
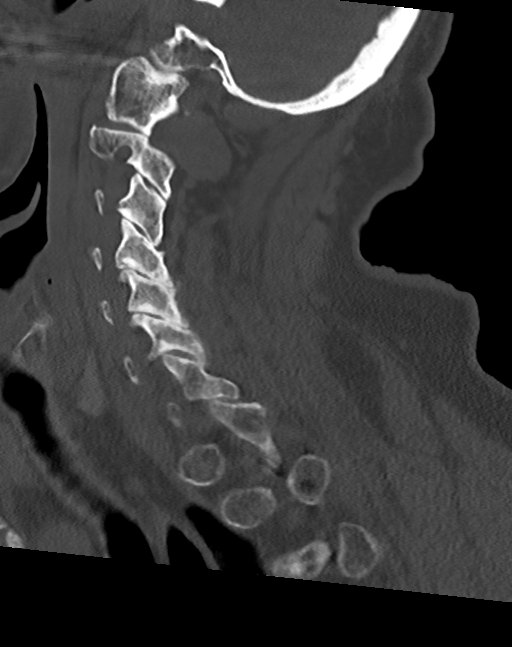
[im 45/89  soft-tissue]
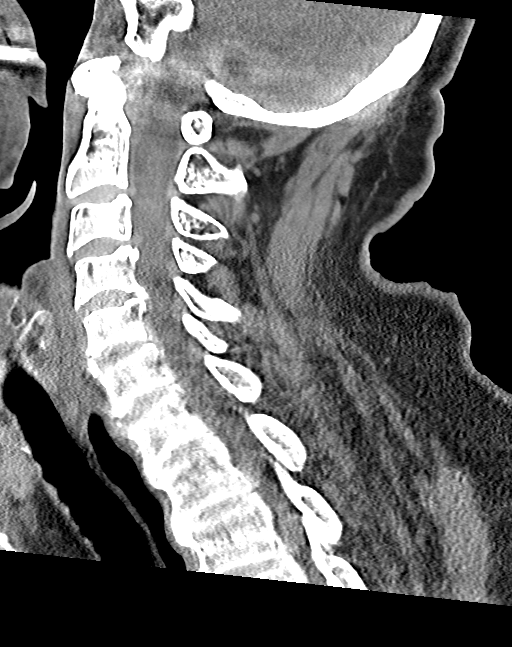
[im 45/89  bone]
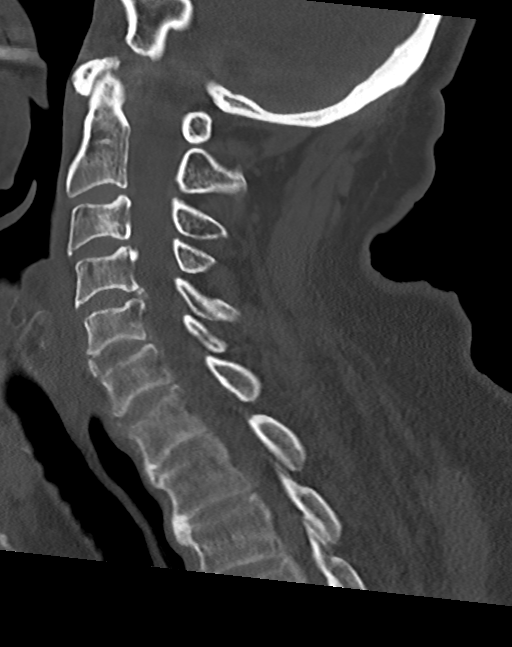
[im 52/89  bone]
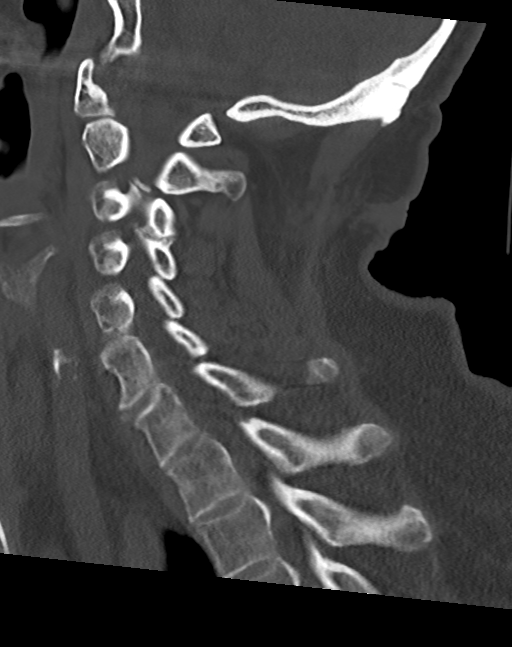
[im 59/89  bone]
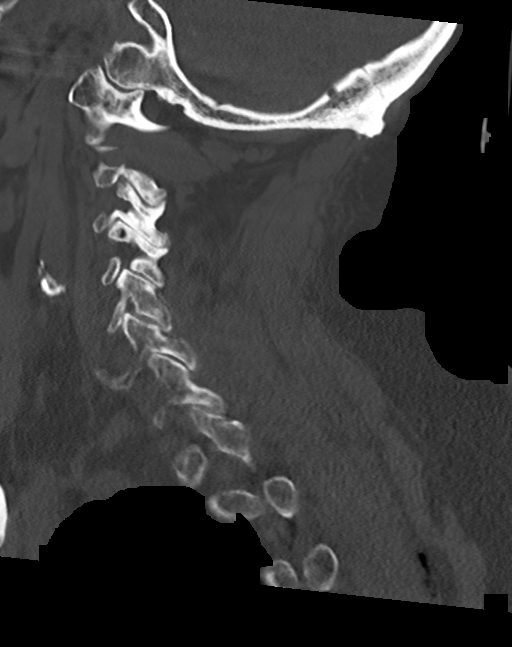

[Series 9: cor bone · coronal · 0.39mm/px · 3 of 72 slices shown]
[im 15/72  bone]
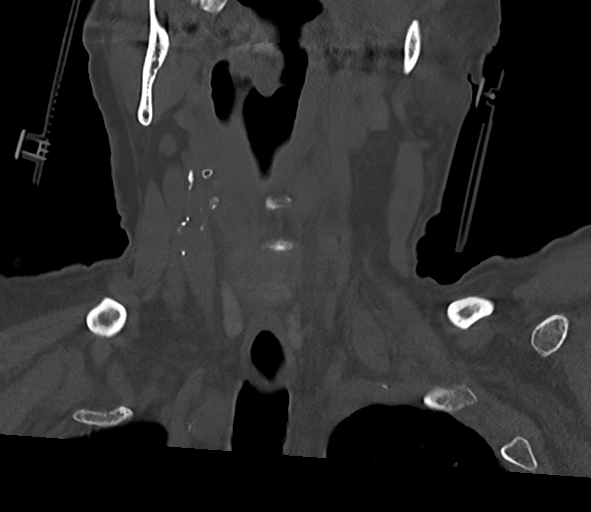
[im 29/72  bone]
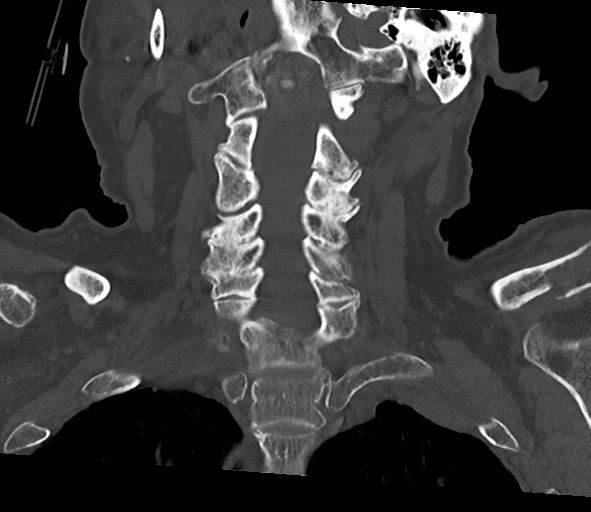
[im 43/72  bone]
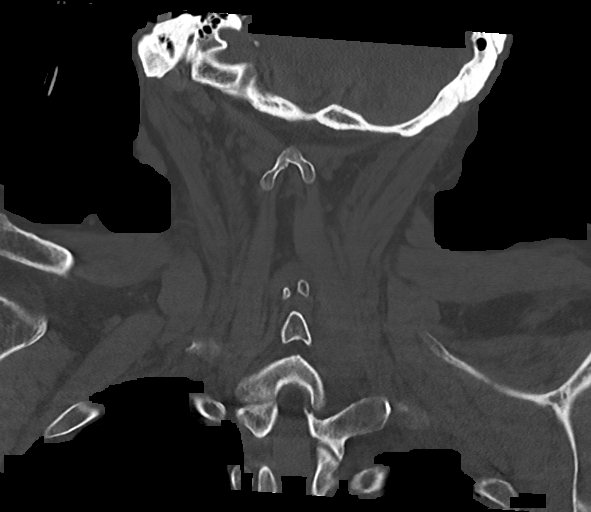

[Series 10: orthogonal axials · axial · 0.21mm/px · z∈[-302,-183]mm · 5 of 94 slices shown, 7 images]
[im 16/94  soft-tissue]
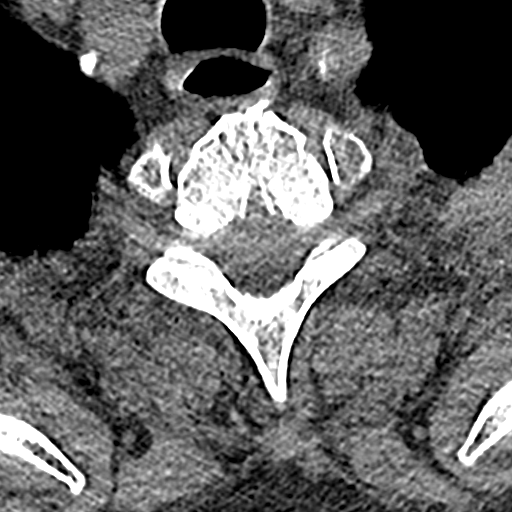
[im 16/94  bone]
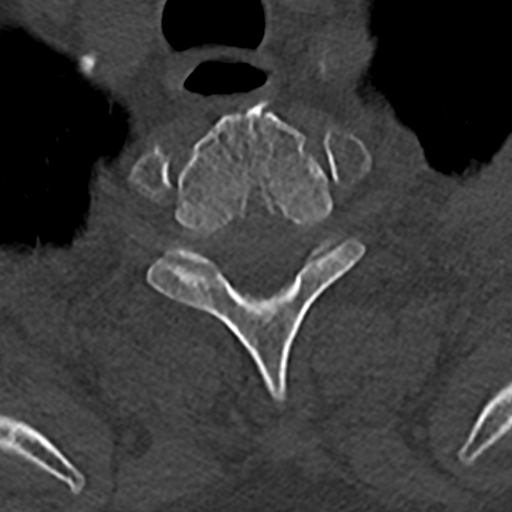
[im 32/94  bone]
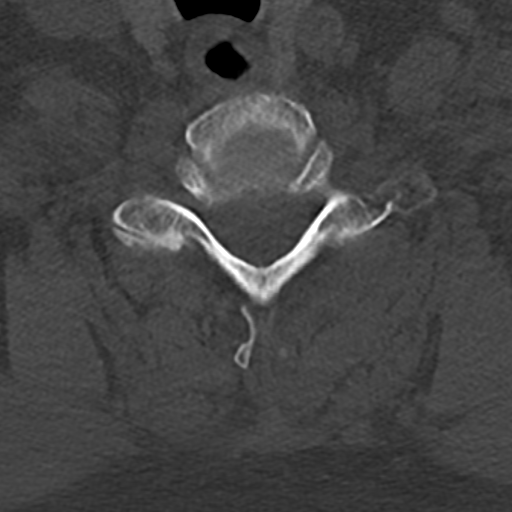
[im 47/94  bone]
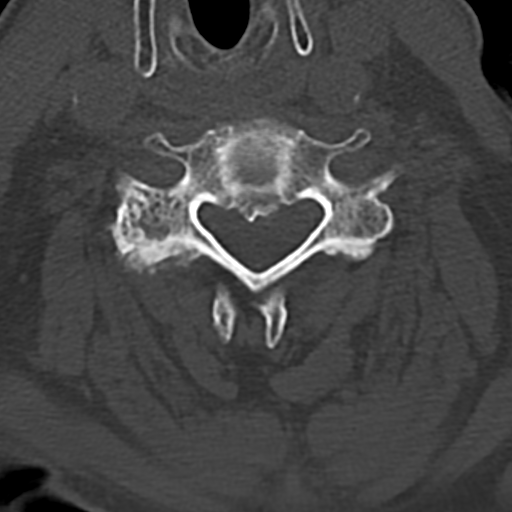
[im 63/94  bone]
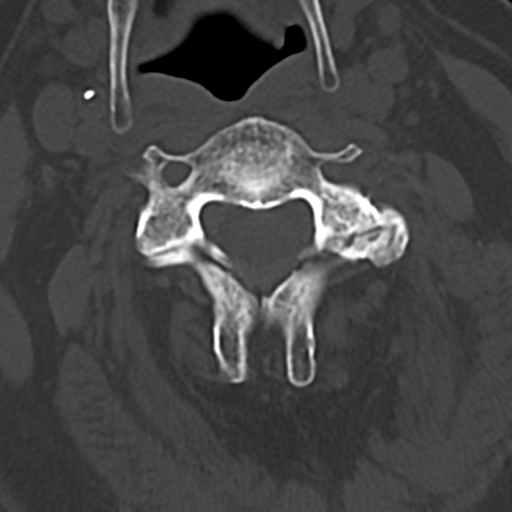
[im 78/94  soft-tissue]
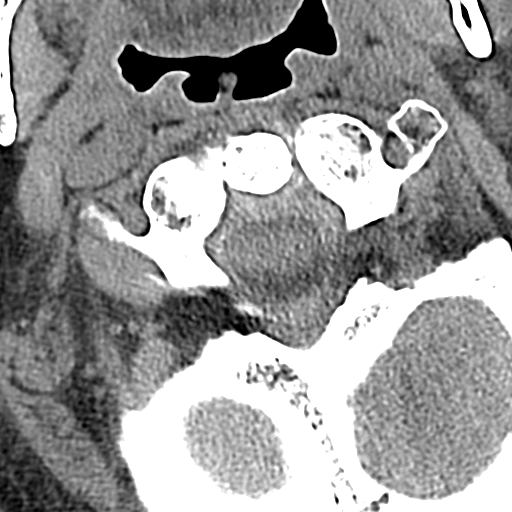
[im 78/94  bone]
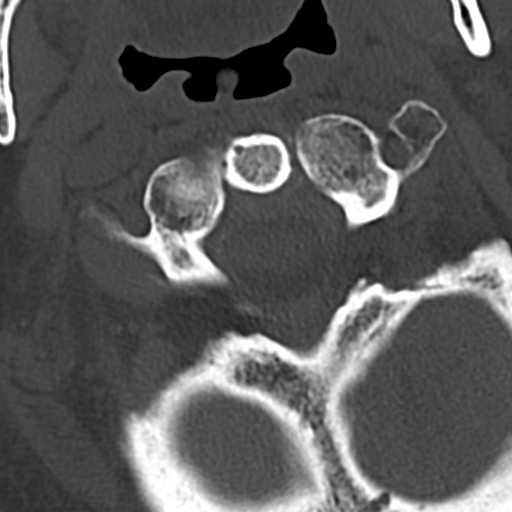

[14 of 35 positions shown; findings below may reference images not displayed]

FINDINGS: Alignment: Normal.

Skull base and vertebrae: No acute fracture. No primary bone lesion
or focal pathologic process.

Soft tissues and spinal canal: No prevertebral fluid or swelling. No
visible canal hematoma.

Disc levels: Scattered facet degenerative change is worst on the
left at C2-3 and C3-4. Mild multilevel disc bulging is also seen.

Upper chest: Lung apices clear.

Other: None.
IMPRESSION: No acute abnormality.

Mild degenerative disease.

## 2019-07-14 IMAGING — CT CT ABD-PELV W/O CM
2 of 4 series · 11 of 46 positions shown, 12 images · non-contrast
Comparison: CT [DATE], contemporary lumbar reconstructions.
COMPARISON: CT [DATE], contemporary lumbar reconstructions.

Addendum:
CLINICAL DATA: Fall down 14 stairs, abdominal bruising

EXAM:
CT ABDOMEN AND PELVIS WITHOUT CONTRAST
TECHNIQUE: Multidetector CT imaging of the abdomen and pelvis was performed
following the standard protocol without IV contrast.

[Series 1: ap without · axial · non-contrast · 0.78mm/px · z∈[+774,+1158]mm · 8 of 99 slices shown, 9 images]
[im 11/99  soft-tissue]
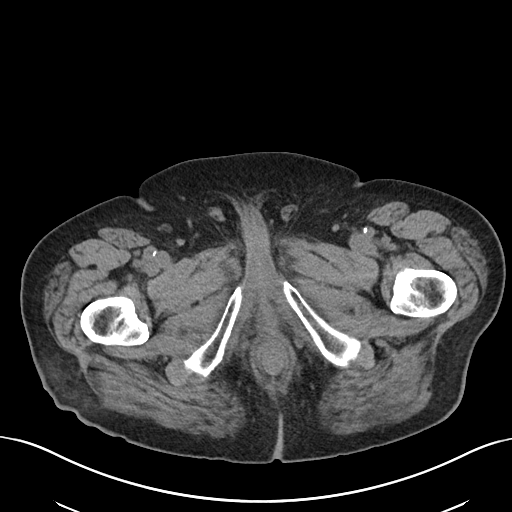
[im 11/99  bone]
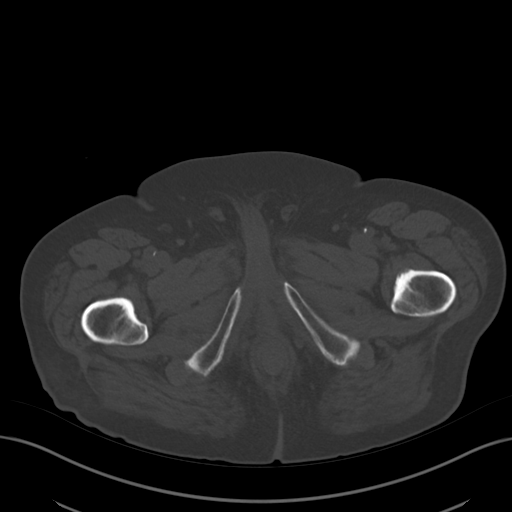
[im 22/99  soft-tissue]
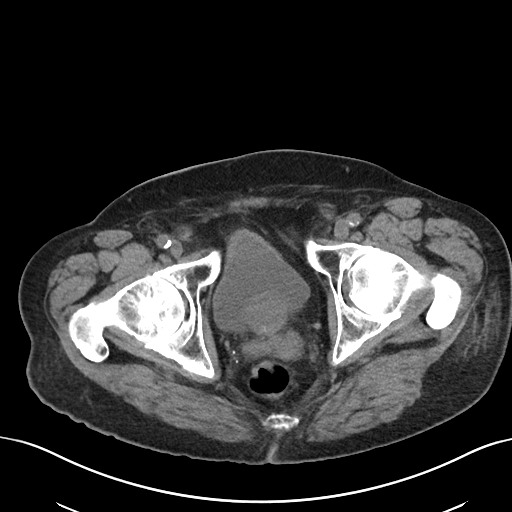
[im 33/99  soft-tissue]
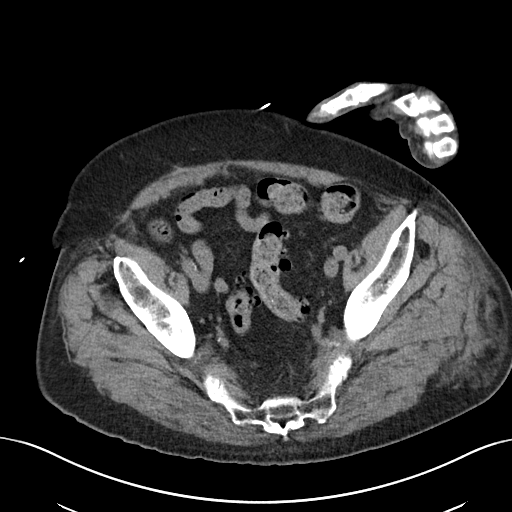
[im 44/99  soft-tissue]
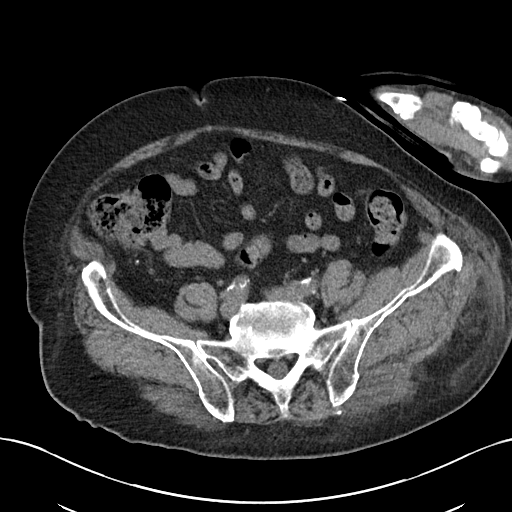
[im 55/99  soft-tissue]
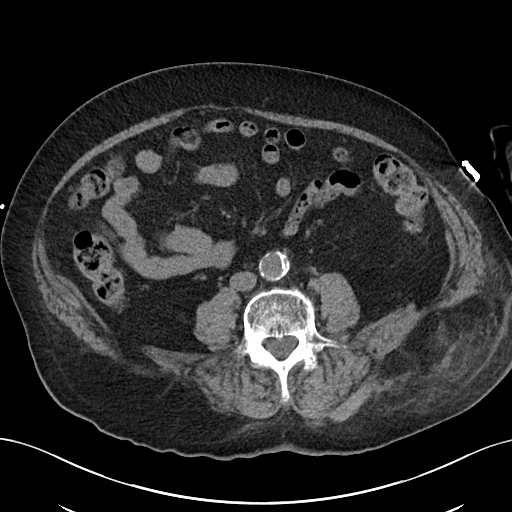
[im 66/99  soft-tissue]
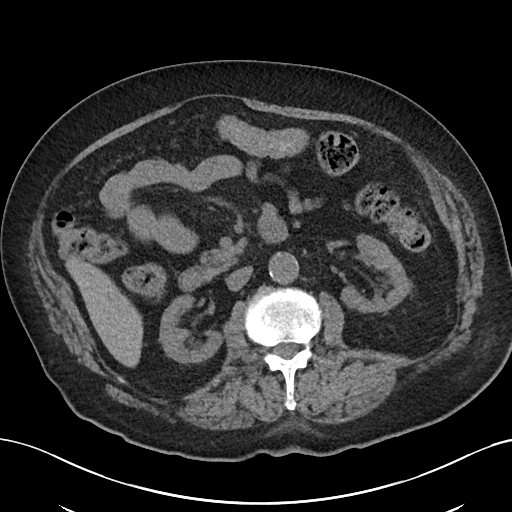
[im 77/99  soft-tissue]
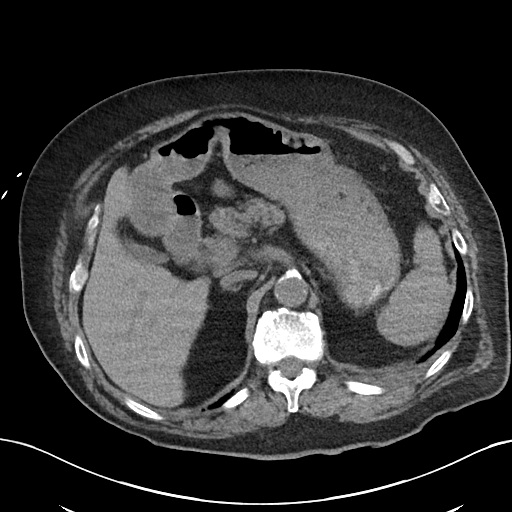
[im 88/99  soft-tissue]
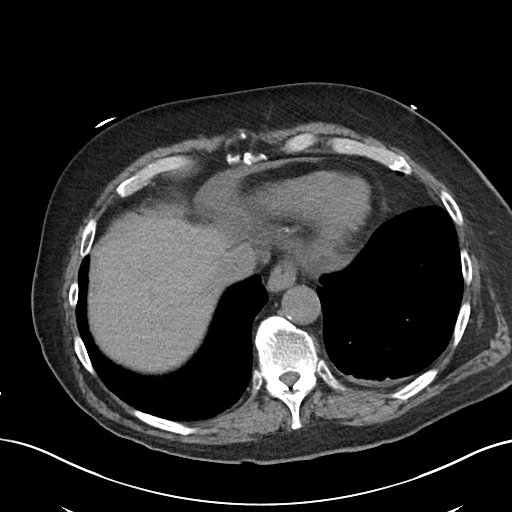

[Series 5: cor · coronal · 0.86mm/px · 3 of 109 slices shown]
[im 37/109  soft-tissue]
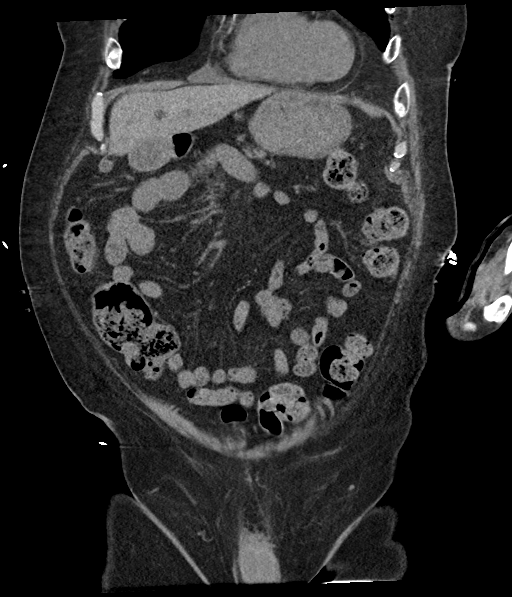
[im 49/109  soft-tissue]
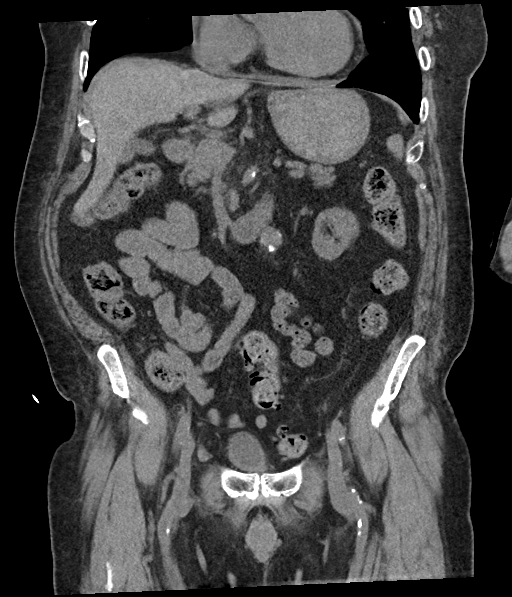
[im 61/109  soft-tissue]
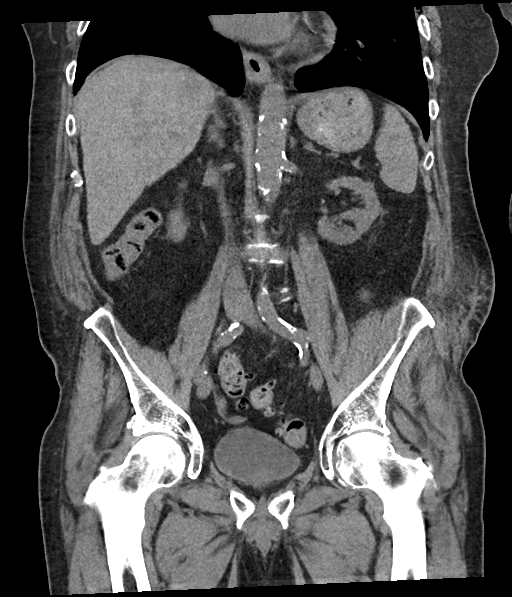

[11 of 46 positions shown; findings below may reference images not displayed]

FINDINGS: Lower chest: Contusion and possible pulmonary laceration within the
lingula ([DATE]) is seen adjacent lateral left sixth, seventh and
eighth rib fractures. Small amount of pleural fluid, likely
hemorrhage. Small anterior pneumothorax ([DATE]). Additional contusive
changes along the left chest wall. Mild cardiomegaly. Extensive
three-vessel coronary artery calcifications. Small pericardial
effusion.

Hepatobiliary: No visible direct hepatic injury. No perihepatic
hematoma. No visible lesions on this noncontrast exam. Normal MUJEVIC
MUJEVIC attenuation. Smooth surface contour. Normal gallbladder and
biliary tree.

Pancreas: No contusive pancreatic changes or evidence of ductal
disruption. No peripancreatic inflammation or visible pancreatic
lesion on noncontrast study.

Spleen: No direct splenic injury or perisplenic hemorrhage.

Adrenals/Urinary Tract: No adrenal hemorrhage or suspicious adrenal
lesions. There is mild bilateral perinephric stranding but with more
focal stranding seen adjacent the posterior left kidney within the
perirenal fat. Evaluation for a direct injury is limited in the
absence of contrast media. No visible or contour deforming renal
lesion. No urolithiasis or hydronephrosis. Indentation of the
bladder base by an enlarged prostate with median lobe hypertrophy.

Stomach/Bowel: Distal esophagus, stomach and duodenal sweep are
unremarkable. No small bowel wall thickening or dilatation. No
evidence of obstruction. Appendix is not visualized. No focal
inflammation the vicinity of the cecum to suggest an occult
appendicitis. No colonic dilatation or wall thickening. Scattered
colonic diverticula without focal pericolonic inflammation to
suggest diverticulitis.

Vascular/Lymphatic: Atherosclerotic calcifications throughout the
abdominal aorta and branch vessels. Focal abdominal aortic ectasia
measuring up to 3.1 cm in the infrarenal abdominal aorta (1/41,
6/78). No enlarged abdominopelvic lymph nodes.

Reproductive: Prostatomegaly with few coarse prostate
calcifications.

Other: Extensive left flank contusive change and body wall hematoma
superficial to the left paraspinal musculature and gluteal
musculature.

Musculoskeletal: Dedicated lumbar reconstructions generated and
dictated separately. Please see report for further details of the
lumbar spine. No acute fracture of the bony pelvis. Advanced
degenerative changes in the bilateral hips. Mild degenerative
changes at the SI joints and symphysis pubis. Enthesopathy
throughout the pelvis. No suspicious osseous lesions. The osseous
structures appear diffusely demineralized which may limit detection
of small or nondisplaced fractures. Arthrosis of the left wrist
incidentally included within the level of imaging. Some
calcification of the triangular fibrocartilage noted as well
possibly chondrocalcinosis/CPPD.
IMPRESSION: 1. Extensive left flank contusive change and body wall hematoma
superficial to the left paraspinal musculature and gluteal
musculature. Hematoma measures up to 13 mm in maximal thickness
(1/50).
2. Contusion and possible pulmonary laceration within the lingula
with adjacent left sixth, seventh and eighth rib fractures. Small
amount of pleural fluid, likely hemorrhage. Small anterior left
pneumothorax.
3. Mild bilateral perinephric stranding but with more focal
stranding, possibly reflecting contusive change, seen adjacent the
posterior left kidney within the perirenal fat. Evaluation for a
direct injury is limited in the absence of contrast media.
4. Dedicated lumbar reconstructions were generated and dictated
separately. Please see full report for further details.
5. No other acute traumatic injuries in the abdomen or pelvis.
6. Prostatomegaly with indentation of the bladder base. Correlate
for symptoms of outlet obstruction.
7. Colonic diverticulosis without evidence of diverticulitis.
8. Advanced degenerative changes in the bilateral hips.
9. Aortic Atherosclerosis ([R0]-[R0]).

ADDENDUM:
Additional comparison: Same day CT Chest

3.1 cm infrarenal abdominal aortic aneurysm. Recommend followup by
ultrasound in 3 years. This recommendation follows ACR consensus
guidelines: White Paper of the ACR Incidental Findings Committee II
aneurysm NOS ([R0]-[R0])

These results and addendum were called by telephone at the time of
interpretation on [DATE] at [DATE] to provider MUJEVIC, who
verbally acknowledged these results.

*** End of Addendum ***
FINDINGS: Lower chest: Contusion and possible pulmonary laceration within the
lingula ([DATE]) is seen adjacent lateral left sixth, seventh and
eighth rib fractures. Small amount of pleural fluid, likely
hemorrhage. Small anterior pneumothorax ([DATE]). Additional contusive
changes along the left chest wall. Mild cardiomegaly. Extensive
three-vessel coronary artery calcifications. Small pericardial
effusion.

Hepatobiliary: No visible direct hepatic injury. No perihepatic
hematoma. No visible lesions on this noncontrast exam. Normal MUJEVIC
MUJEVIC attenuation. Smooth surface contour. Normal gallbladder and
biliary tree.

Pancreas: No contusive pancreatic changes or evidence of ductal
disruption. No peripancreatic inflammation or visible pancreatic
lesion on noncontrast study.

Spleen: No direct splenic injury or perisplenic hemorrhage.

Adrenals/Urinary Tract: No adrenal hemorrhage or suspicious adrenal
lesions. There is mild bilateral perinephric stranding but with more
focal stranding seen adjacent the posterior left kidney within the
perirenal fat. Evaluation for a direct injury is limited in the
absence of contrast media. No visible or contour deforming renal
lesion. No urolithiasis or hydronephrosis. Indentation of the
bladder base by an enlarged prostate with median lobe hypertrophy.

Stomach/Bowel: Distal esophagus, stomach and duodenal sweep are
unremarkable. No small bowel wall thickening or dilatation. No
evidence of obstruction. Appendix is not visualized. No focal
inflammation the vicinity of the cecum to suggest an occult
appendicitis. No colonic dilatation or wall thickening. Scattered
colonic diverticula without focal pericolonic inflammation to
suggest diverticulitis.

Vascular/Lymphatic: Atherosclerotic calcifications throughout the
abdominal aorta and branch vessels. Focal abdominal aortic ectasia
measuring up to 3.1 cm in the infrarenal abdominal aorta (1/41,
6/78). No enlarged abdominopelvic lymph nodes.

Reproductive: Prostatomegaly with few coarse prostate
calcifications.

Other: Extensive left flank contusive change and body wall hematoma
superficial to the left paraspinal musculature and gluteal
musculature.

Musculoskeletal: Dedicated lumbar reconstructions generated and
dictated separately. Please see report for further details of the
lumbar spine. No acute fracture of the bony pelvis. Advanced
degenerative changes in the bilateral hips. Mild degenerative
changes at the SI joints and symphysis pubis. Enthesopathy
throughout the pelvis. No suspicious osseous lesions. The osseous
structures appear diffusely demineralized which may limit detection
of small or nondisplaced fractures. Arthrosis of the left wrist
incidentally included within the level of imaging. Some
calcification of the triangular fibrocartilage noted as well
possibly chondrocalcinosis/CPPD.
IMPRESSION: 1. Extensive left flank contusive change and body wall hematoma
superficial to the left paraspinal musculature and gluteal
musculature. Hematoma measures up to 13 mm in maximal thickness
(1/50).
2. Contusion and possible pulmonary laceration within the lingula
with adjacent left sixth, seventh and eighth rib fractures. Small
amount of pleural fluid, likely hemorrhage. Small anterior left
pneumothorax.
3. Mild bilateral perinephric stranding but with more focal
stranding, possibly reflecting contusive change, seen adjacent the
posterior left kidney within the perirenal fat. Evaluation for a
direct injury is limited in the absence of contrast media.
4. Dedicated lumbar reconstructions were generated and dictated
separately. Please see full report for further details.
5. No other acute traumatic injuries in the abdomen or pelvis.
6. Prostatomegaly with indentation of the bladder base. Correlate
for symptoms of outlet obstruction.
7. Colonic diverticulosis without evidence of diverticulitis.
8. Advanced degenerative changes in the bilateral hips.
9. Aortic Atherosclerosis ([R0]-[R0]).

## 2019-07-14 IMAGING — CT CT HEAD W/O CM
4 series · 17 of 47 positions shown, 19 images · non-contrast
Comparison: Head CT scan [DATE].

CLINICAL DATA: Status post fall down stairs with a blow to the
head. Headache. Initial encounter.

EXAM:
CT HEAD WITHOUT CONTRAST
TECHNIQUE: Contiguous axial images were obtained from the base of the skull
through the vertex without intravenous contrast.

[Series 3: head wo · axial · 0.44mm/px · z∈[-163,-23]mm · 7 of 38 slices shown, 9 images]
[im 5/38  brain]
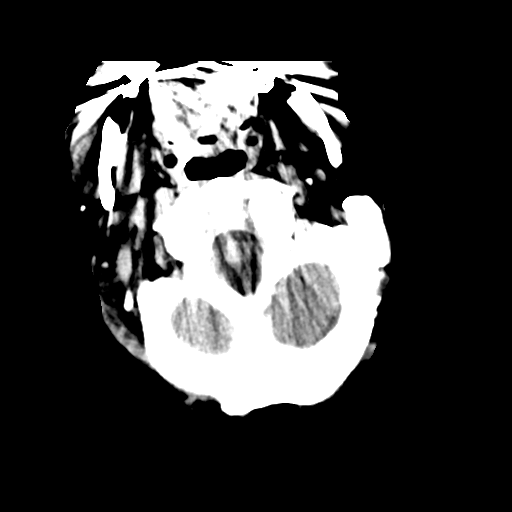
[im 5/38  bone]
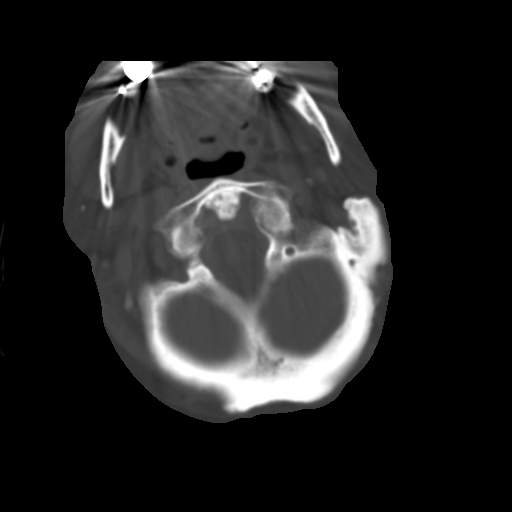
[im 10/38  brain]
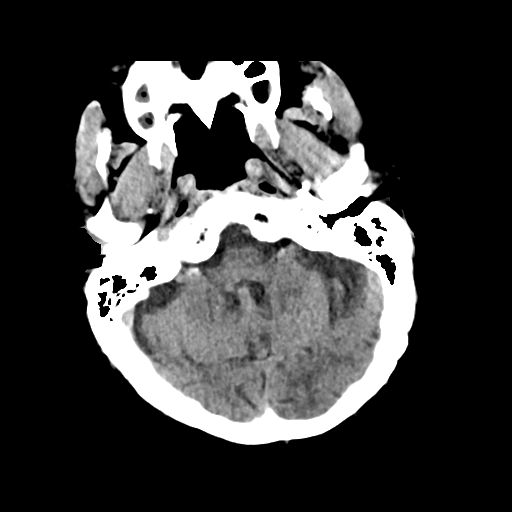
[im 14/38  brain]
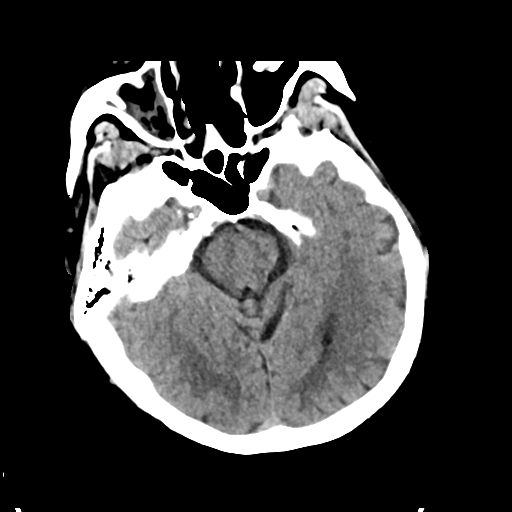
[im 19/38  brain]
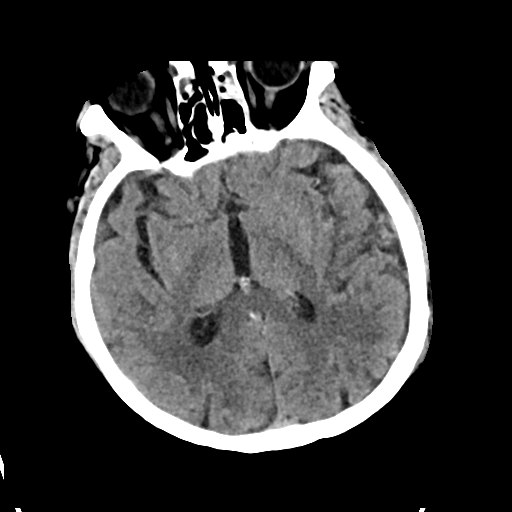
[im 24/38  brain]
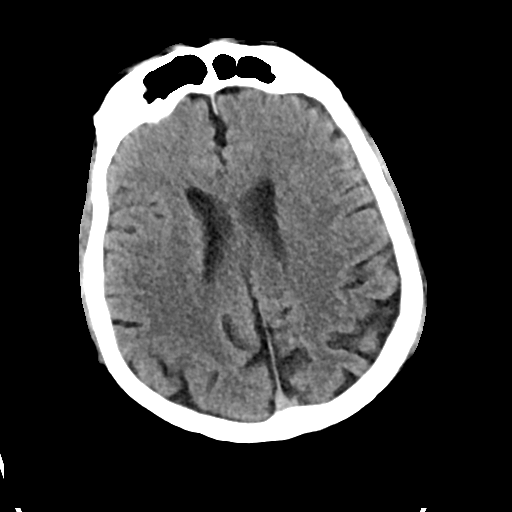
[im 24/38  bone]
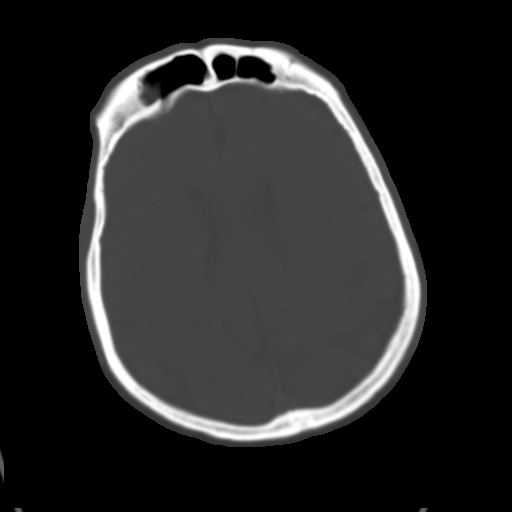
[im 28/38  brain]
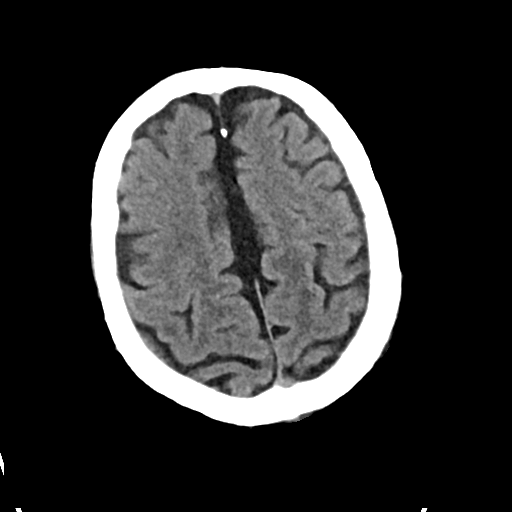
[im 33/38  brain]
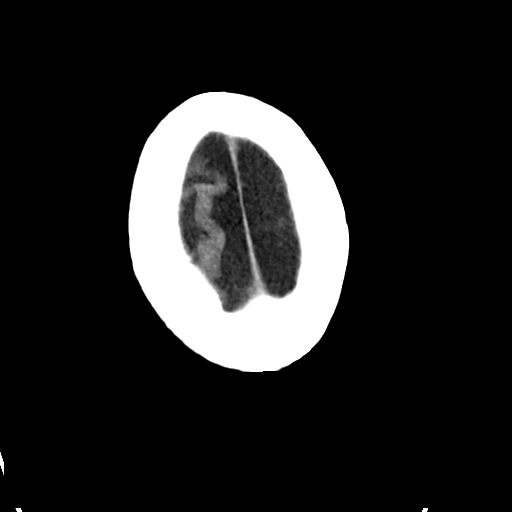

[Series 4: head bone · axial · 0.44mm/px · z∈[-165,-99]mm · 4 of 95 slices shown]
[im 10/95  bone]
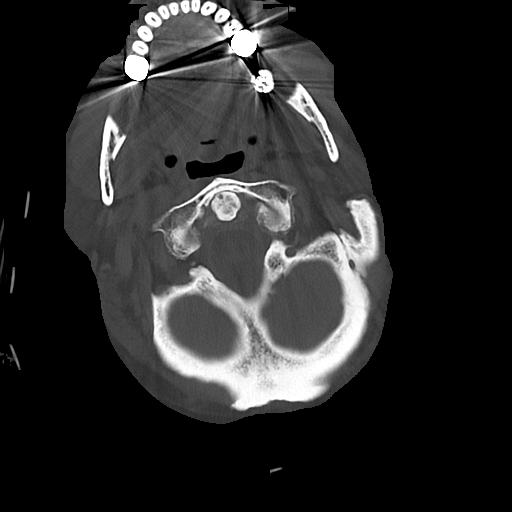
[im 19/95  bone]
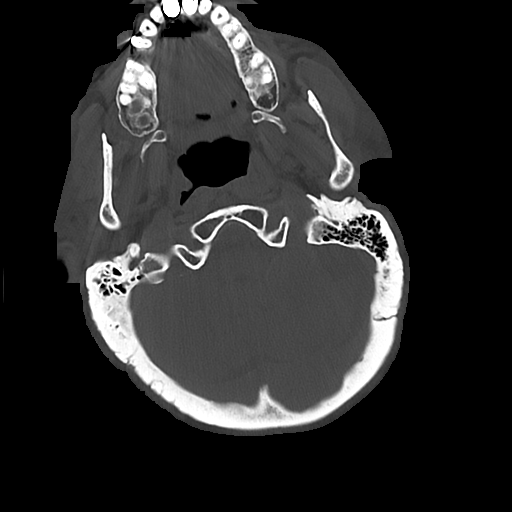
[im 29/95  bone]
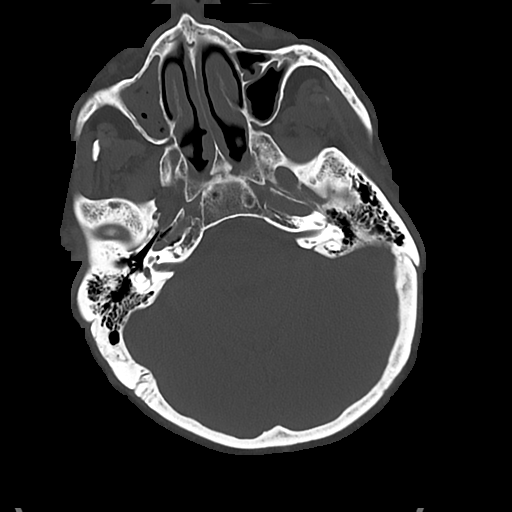
[im 43/95  bone]
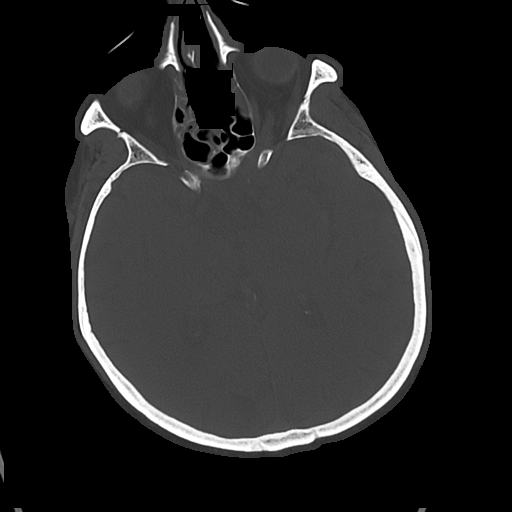

[Series 5: cor soft · coronal · 0.38mm/px · 3 of 74 slices shown]
[im 25/74  brain]
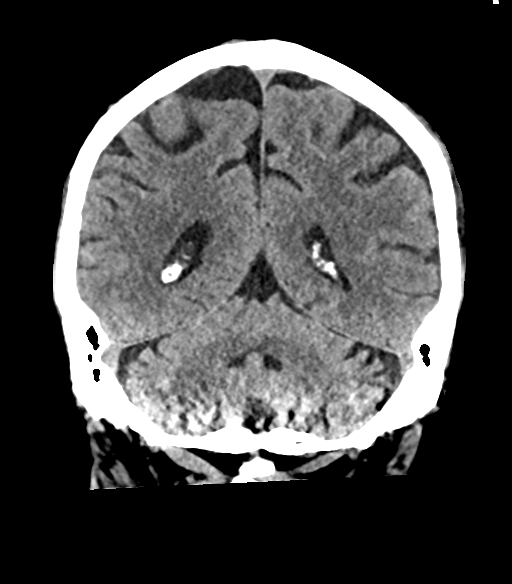
[im 33/74  brain]
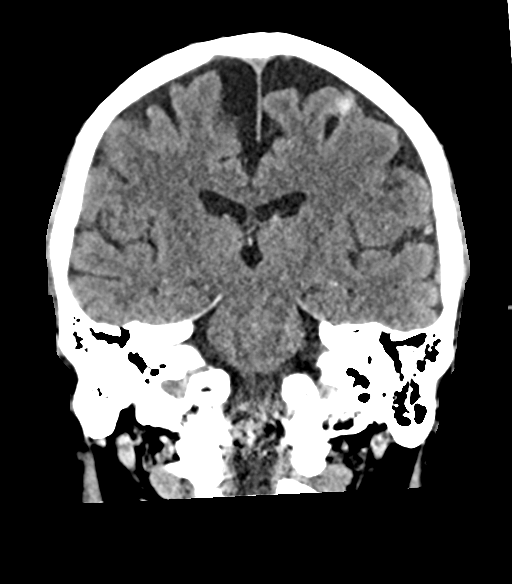
[im 41/74  brain]
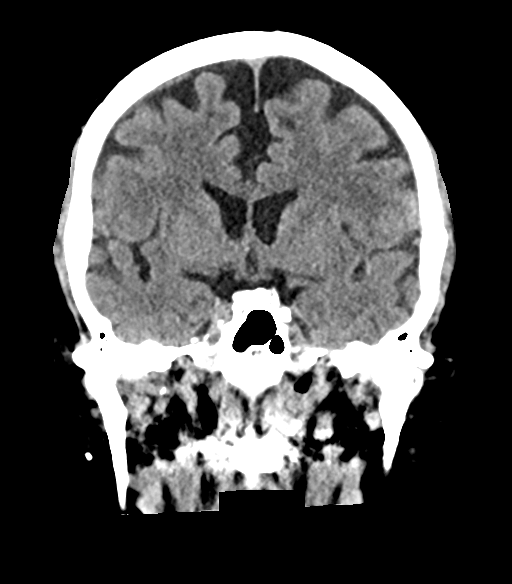

[Series 6: sag soft · sagittal · 0.43mm/px · 3 of 65 slices shown]
[im 22/65  brain]
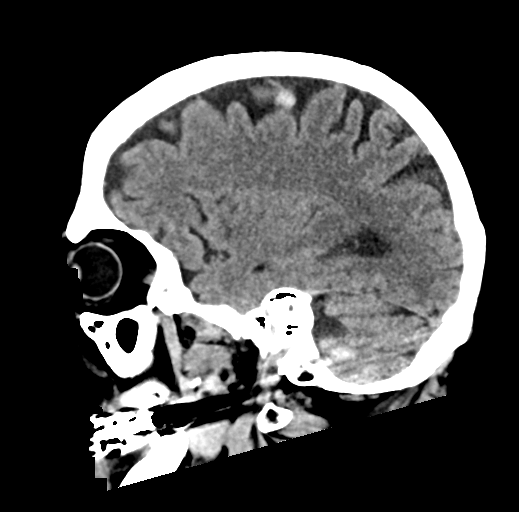
[im 33/65  brain]
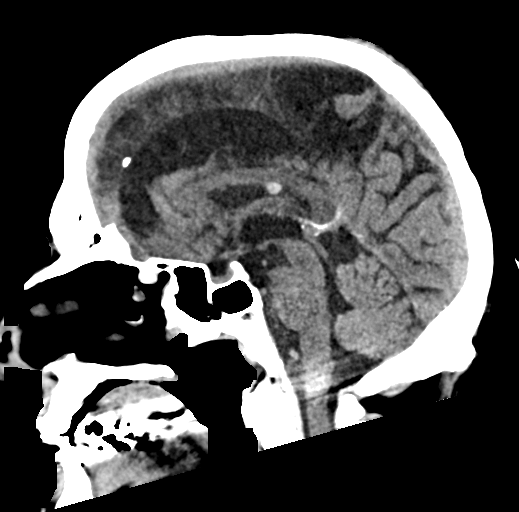
[im 43/65  brain]
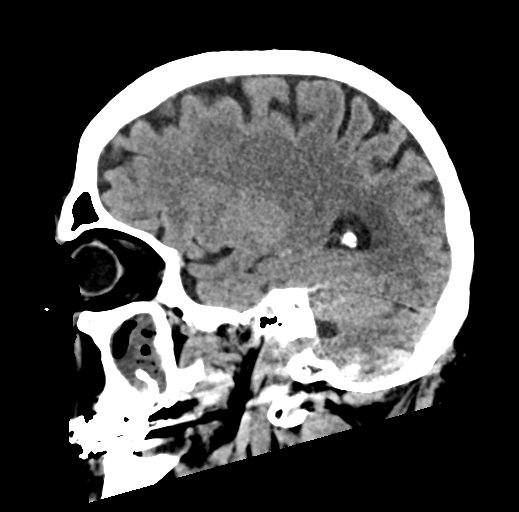

[17 of 47 positions shown; findings below may reference images not displayed]

FINDINGS: Brain: A small amount of subarachnoid hemorrhage is seen on the left
and best visualized along the left insula and high left parietal
lobe. No subdural hemorrhage, mass, hydrocephalus, pneumocephalus or
midline shift. No intraventricular blood is identified.

Vascular: No hyperdense vessel or unexpected calcification.

Skull: Intact.  No focal lesion.

Sinuses/Orbits: Mucosal thickening in the maxillary sinuses is worse
on the right. Ethmoid air cell disease also noted.

Other: Scalp contusion over the left parietal bone is noted.
IMPRESSION: Small volume of subarachnoid hemorrhage on the left. Scalp contusion
on the left without underlying fracture is also seen.

Sinus disease.

Critical Value/emergent results were called by telephone at the time
of interpretation on [DATE] at [DATE] to provider CHRISTY JOANNE
, who verbally acknowledged these results.

## 2019-07-14 IMAGING — DX DG PORTABLE PELVIS
1 series · 1 of 1 positions shown · non-contrast
Comparison: None.

CLINICAL DATA: Fell down stairs.

EXAM:
PORTABLE PELVIS 1-2 VIEWS

[pelvis]
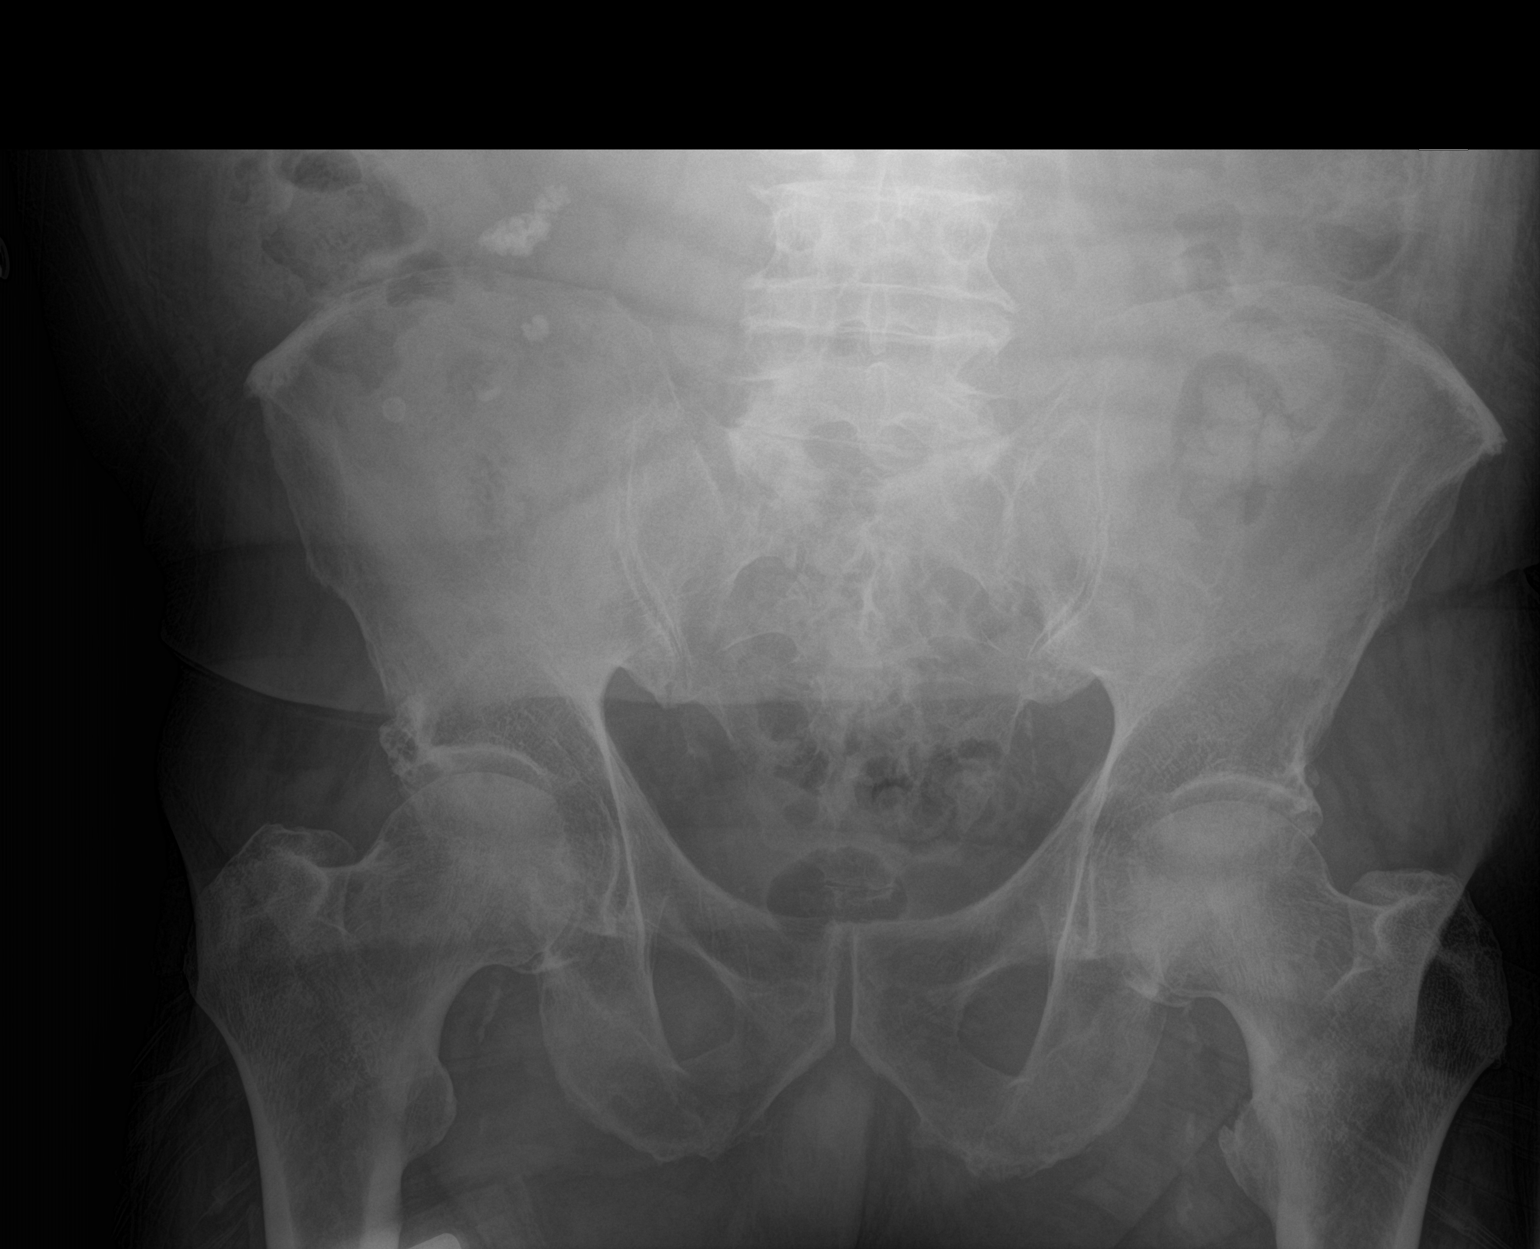

[1 of 1 positions shown; findings below may reference images not displayed]

FINDINGS: There is no evidence of pelvic fracture or diastasis. No pelvic bone
lesions are seen.
IMPRESSION: Negative.

## 2019-07-14 IMAGING — DX DG CHEST 1V PORT
1 series · 1 of 1 positions shown · non-contrast
Comparison: [DATE]
COMPARISON: [DATE]

Addendum:
CLINICAL DATA: Fell down stairs. Pain in the left shoulder and
hips.

EXAM:
PORTABLE CHEST 1 VIEW

[chest]
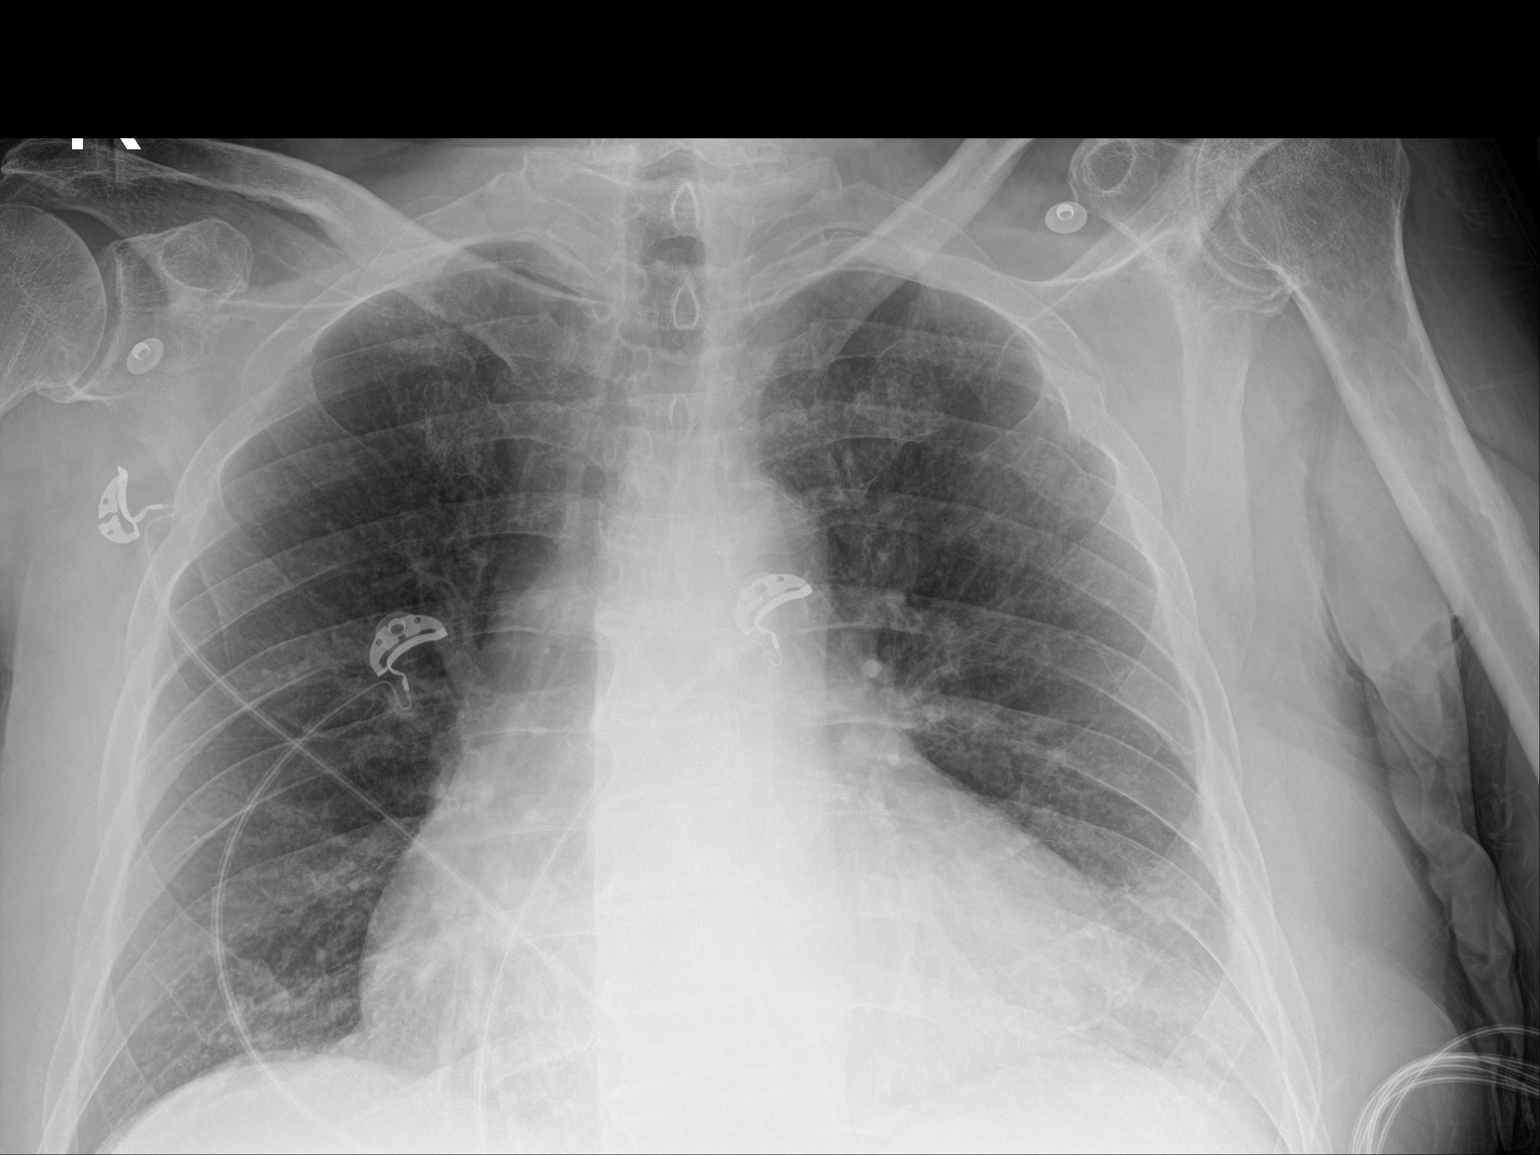

[1 of 1 positions shown; findings below may reference images not displayed]

FINDINGS: Enlarged cardiac silhouette. Mediastinal shadows are normal.
Question atelectasis or infiltrate at the left lung base. No
pneumothorax or hemothorax. There appear to be fractures of the left
second third and fourth ribs, age indeterminate.
IMPRESSION: Cardiomegaly.  Question left base infiltrate or atelectasis.

Abnormal appearance of the left posterolateral second, third and
fourth ribs, age indeterminate. Consider chest CT or rib detail
films.

ADDENDUM:
There is a fracture of the distal left clavicle.

*** End of Addendum ***
FINDINGS: Enlarged cardiac silhouette. Mediastinal shadows are normal.
Question atelectasis or infiltrate at the left lung base. No
pneumothorax or hemothorax. There appear to be fractures of the left
second third and fourth ribs, age indeterminate.
IMPRESSION: Cardiomegaly.  Question left base infiltrate or atelectasis.

Abnormal appearance of the left posterolateral second, third and
fourth ribs, age indeterminate. Consider chest CT or rib detail
films.

## 2019-07-14 IMAGING — DX DG SHOULDER 1V*L*
2 series · 3 of 3 positions shown · non-contrast
Comparison: None.

CLINICAL DATA: Fell down stairs.

EXAM:
LEFT SHOULDER

[Series 1: shoulder · 0.14mm/px · 2 of 2 slices shown]
[im 1/2]
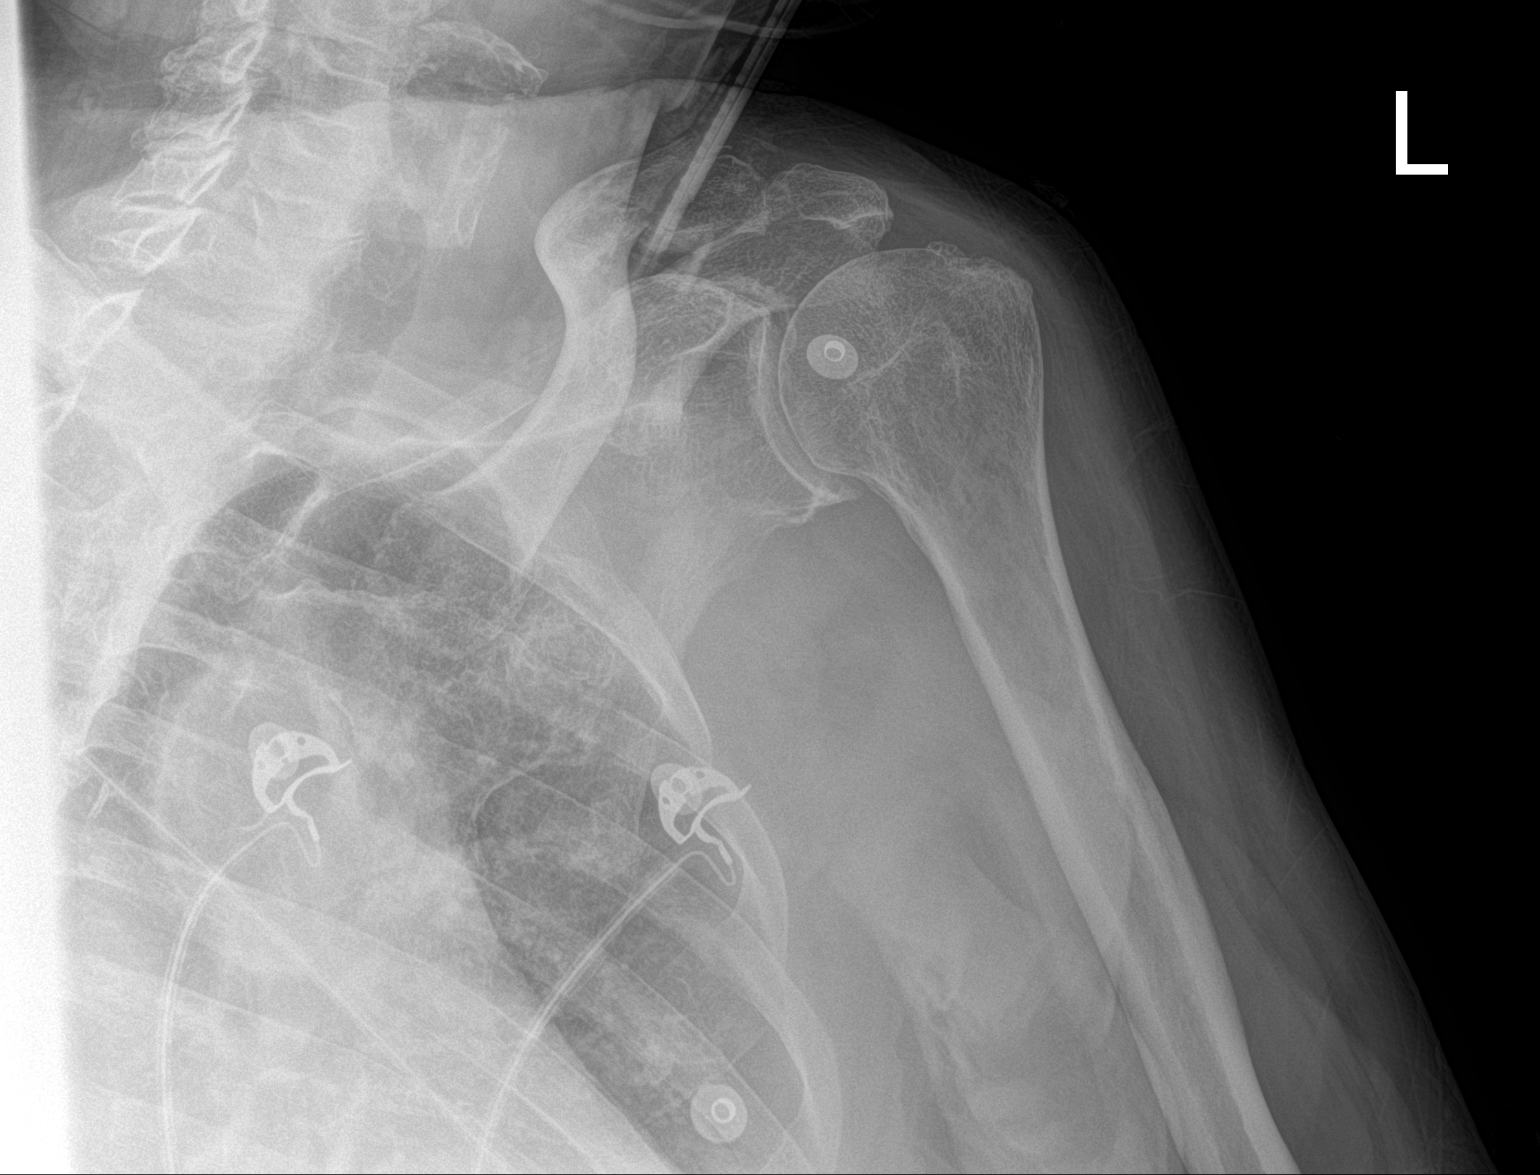
[im 2/2]
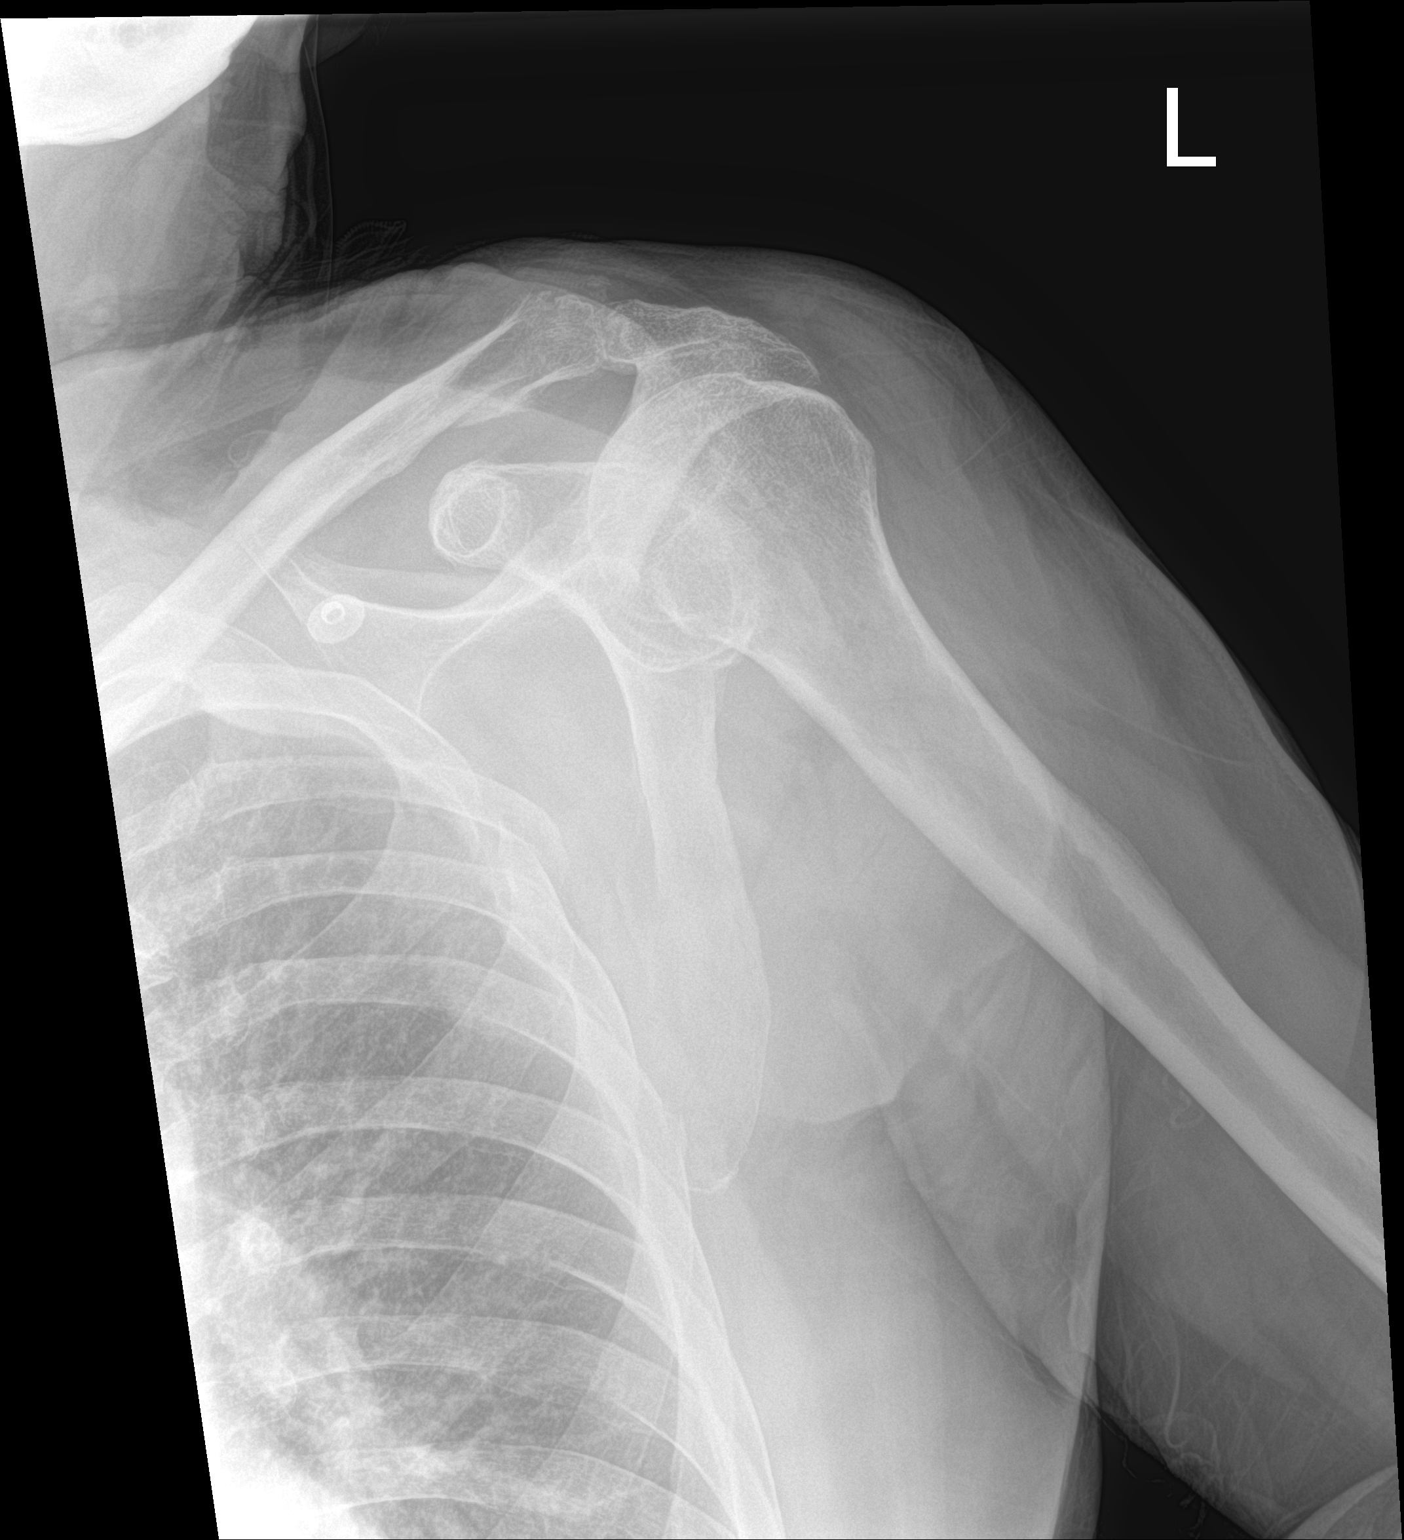

[scapula]
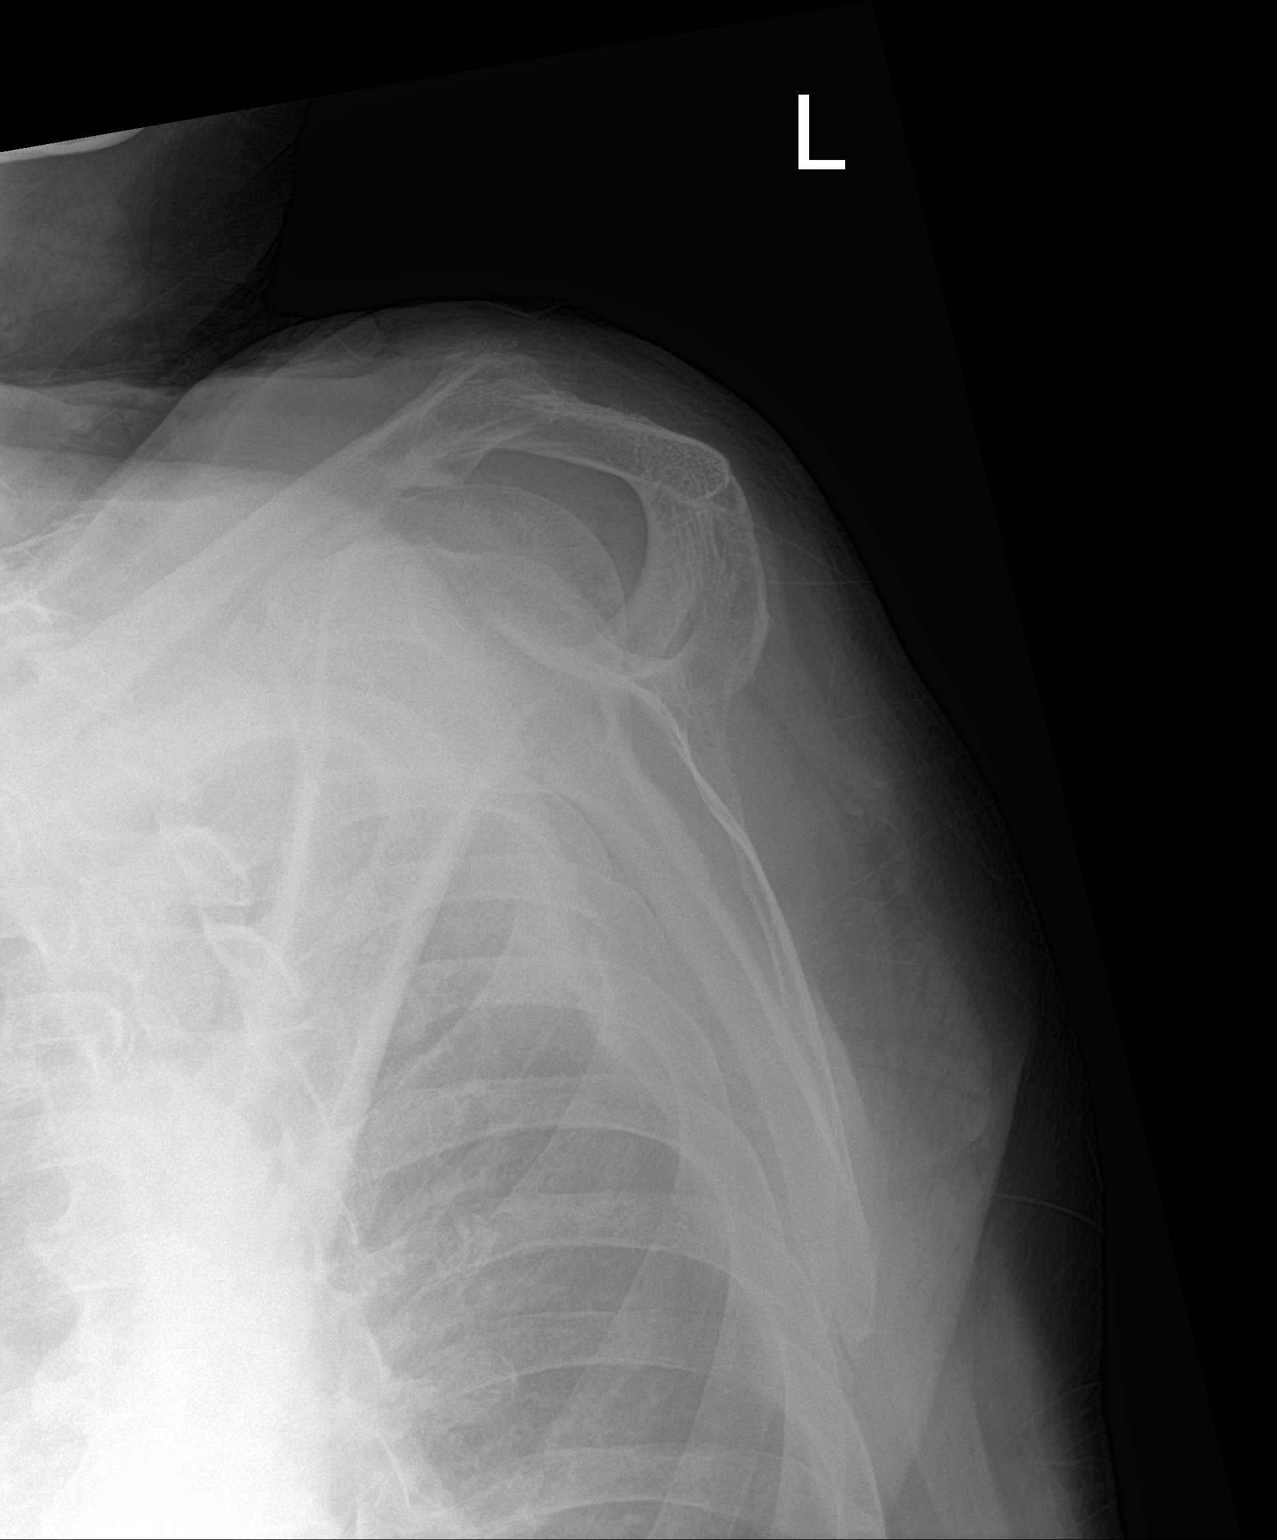

[3 of 3 positions shown; findings below may reference images not displayed]

FINDINGS: Humeral head is normally located. No fracture of the humerus, or
scapula. There is a fracture of the distal clavicle. There appear to
be fractures of the left second, third, fourth and possibly fifth
ribs. I favor these are acute.
IMPRESSION: Probably acute fractures of the left second through fifth ribs
posteriorly/laterally.

Distal clavicular fracture.

## 2019-07-14 IMAGING — CT CT L SPINE W/O CM
3 of 4 series · 11 of 33 positions shown, 13 images · non-contrast
Comparison: Contemporary CT abdomen and pelvis., CT abdomen pelvis
[DATE]

CLINICAL DATA: Fall down 14 stairs

EXAM:
CT LUMBAR SPINE WITHOUT CONTRAST
TECHNIQUE: Multiplanar reconstructions of the lumbar spine were generated from
contemporary CT of the abdomen and pelvis without contrast.

[Series 8: l spine axial · axial · 0.29mm/px · z∈[+918,+1094]mm · 3 of 133 slices shown, 4 images]
[im 23/133  soft-tissue]
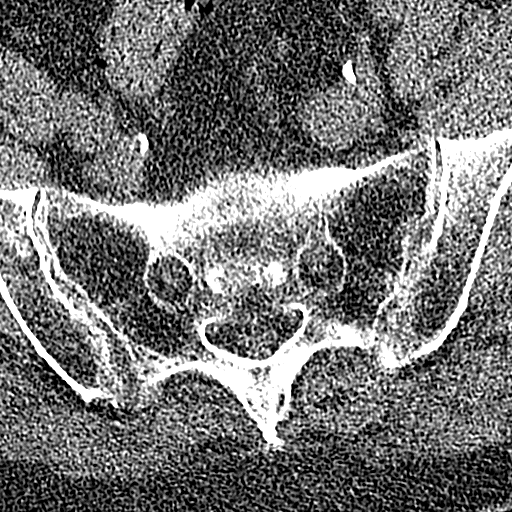
[im 23/133  bone]
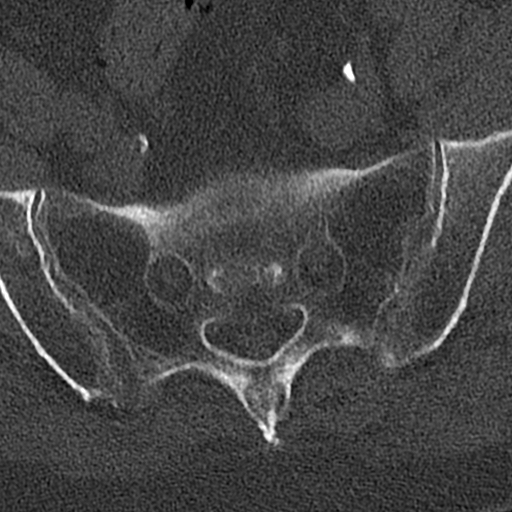
[im 67/133  bone]
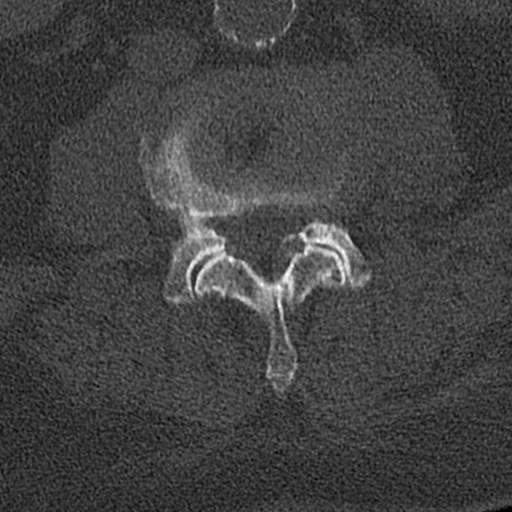
[im 111/133  bone]
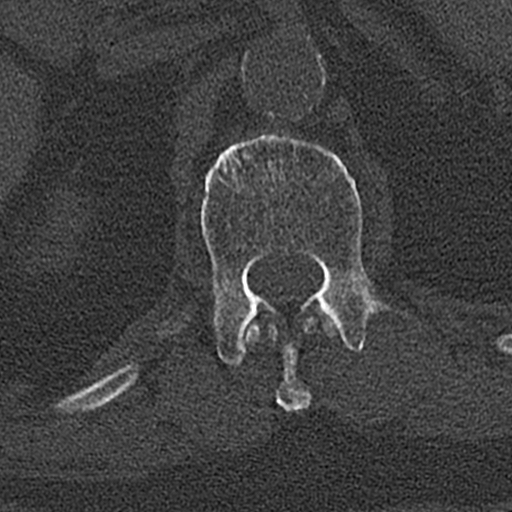

[Series 9: l spine cor · coronal · 0.35mm/px · 3 of 86 slices shown]
[im 18/86  bone]
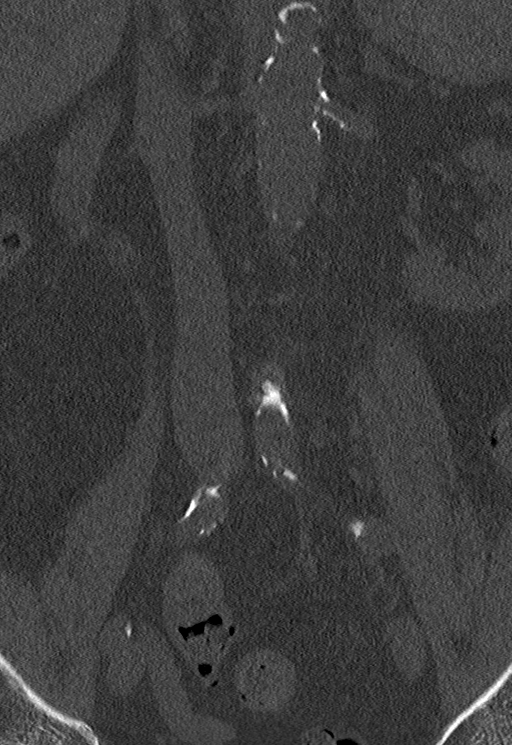
[im 35/86  bone]
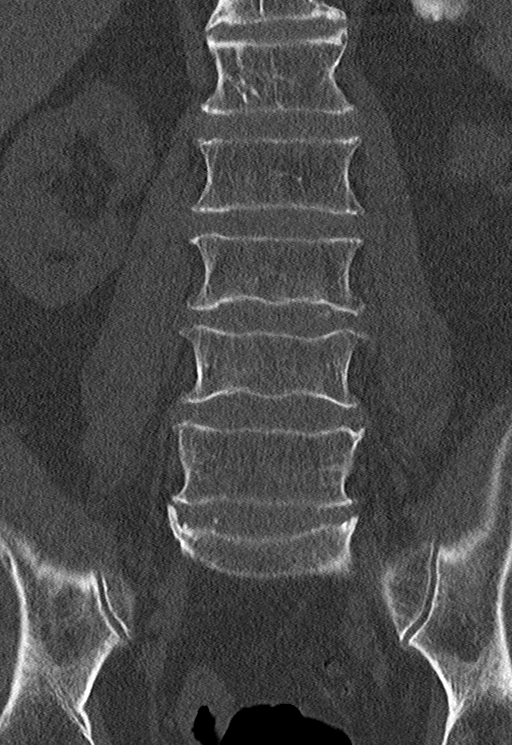
[im 52/86  bone]
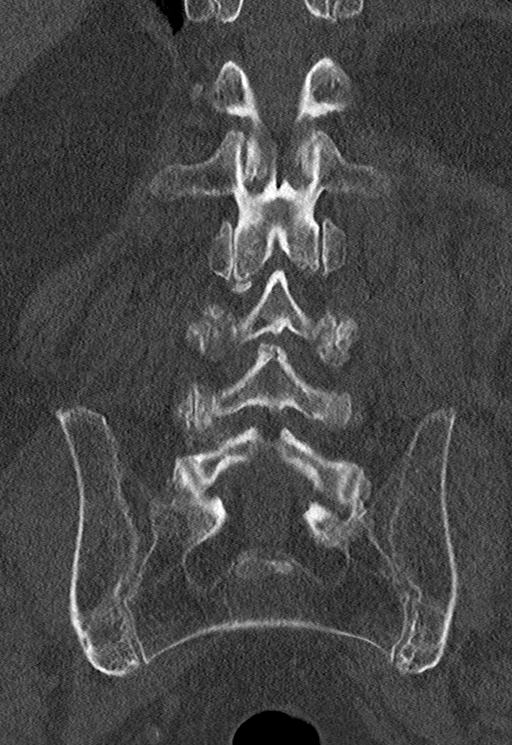

[Series 10: l spine sag · sagittal · 0.33mm/px · 5 of 74 slices shown, 6 images]
[im 25/74  bone]
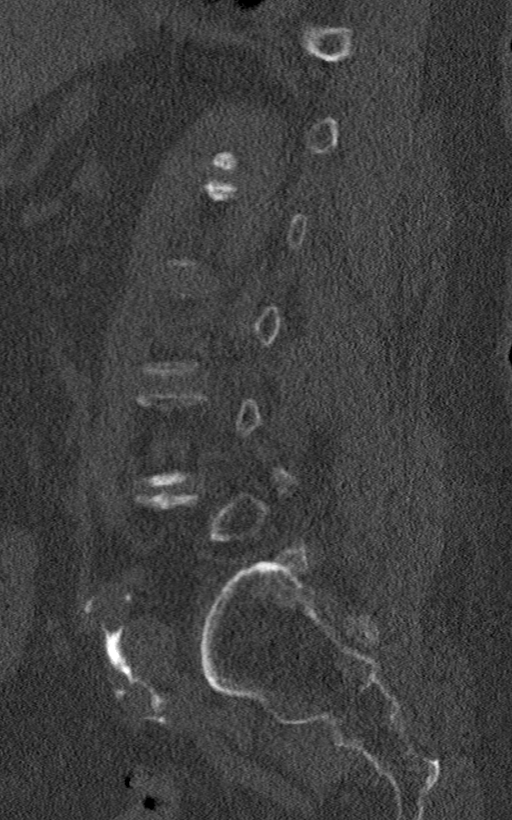
[im 31/74  bone]
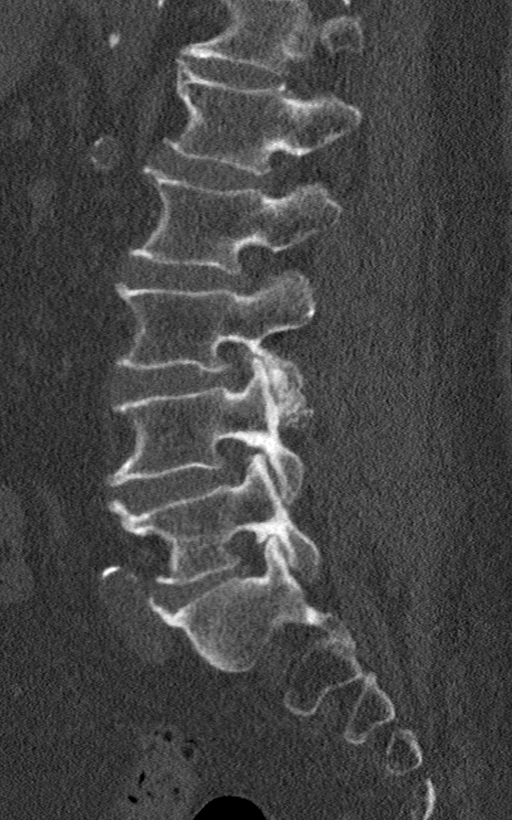
[im 37/74  soft-tissue]
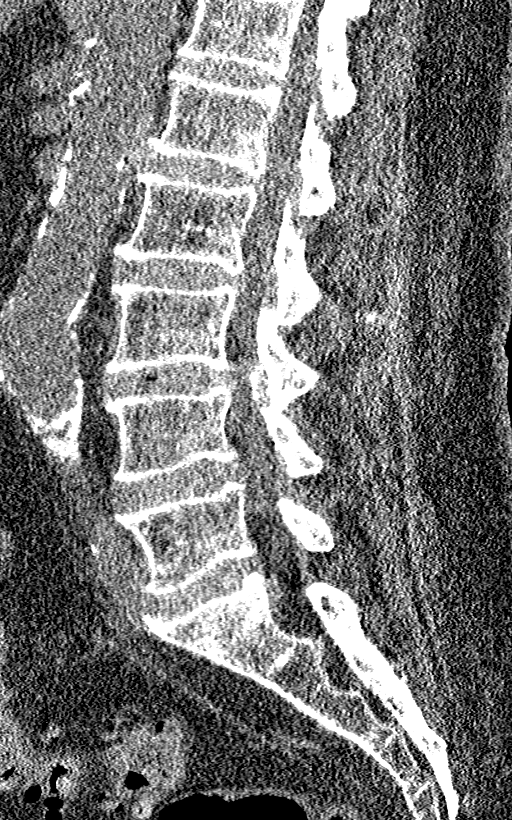
[im 37/74  bone]
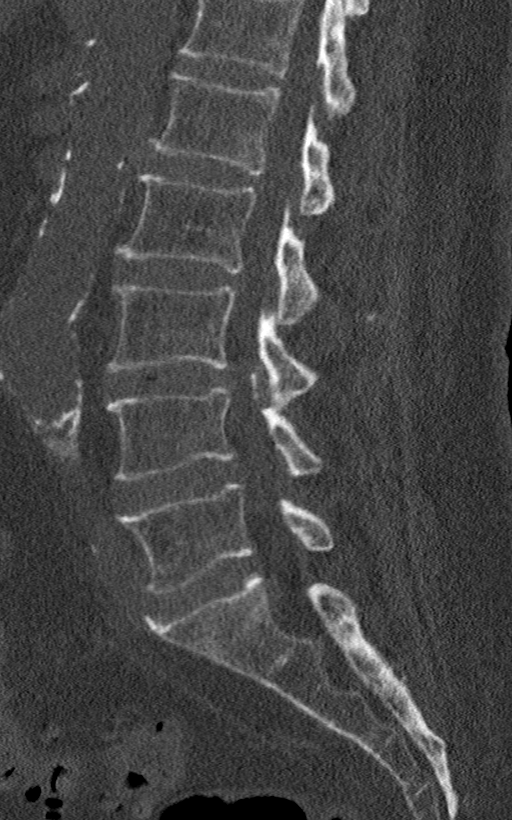
[im 43/74  bone]
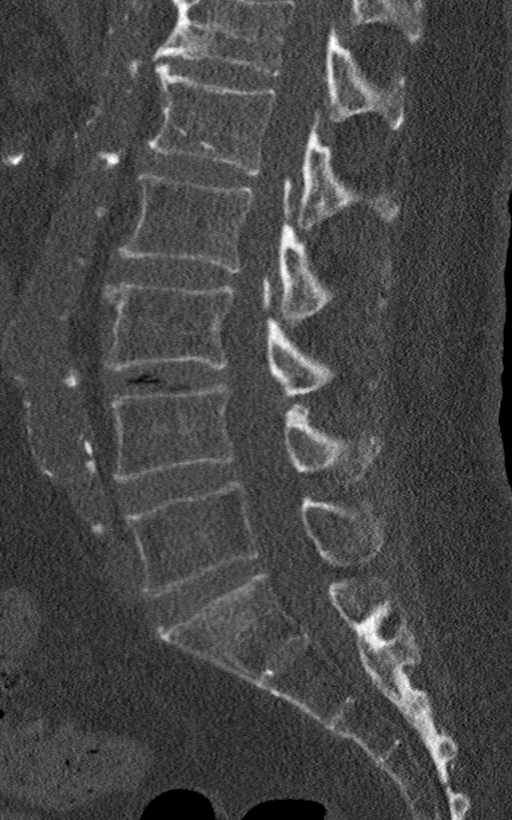
[im 49/74  bone]
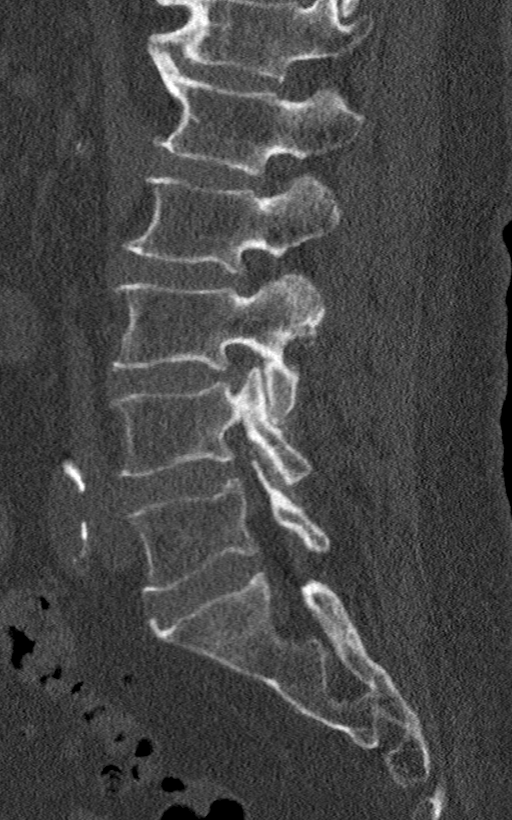

[11 of 33 positions shown; findings below may reference images not displayed]

FINDINGS: Segmentation: 5 lumbar type vertebrae with congenital nonfusion of
the right transverse process of L1.

Alignment: Slight levocurvature of the lumbar spine with an apex at
the L2 level. Vertebral bodies and posterior elements are normally
aligned without spondylolysis or spondylolisthesis.

Vertebrae: No acute fracture or focal pathologic process. Multilevel
discogenic endplate changes are present. Additional facet
degenerative changes throughout the lumbar spine as well.

Paraspinal and other soft tissues: Contusive changes of the left
flank and superficial to the paraspinal musculature, better detailed
on dedicated CT of the abdomen and pelvis from which this study is
reconstructed.

Disc levels:

Level by level evaluation of the lumbar spine below:

T12-L1: Mild disc height loss without significant posterior disc
abnormality. Mild bilateral facet arthropathy. No significant spinal
canal or foraminal stenosis.

L1-L2: Mild disc height loss without significant posterior disc
abnormality. Moderate bilateral facet arthropathy. No significant
canal stenosis. Mild bilateral foraminal narrowing.

L2-L3: Moderate disc height loss with partially calcified global
disc bulge. Moderate bilateral facet arthropathy. Mild canal
stenosis and bilateral foraminal narrowing as well as partial
effacement of the lateral recesses.

L3-L4: Moderate disc height loss, vacuum disc, partially calcified
global disc bulge slightly eccentric to the right subarticular and
foraminal zones. Some mild ligamentum flavum redundancy and moderate
bilateral facet arthropathy. Mild to moderate canal stenosis and
moderate bilateral foraminal narrowing as well as effacement of the
lateral recesses.

L4-L5: Mild disc height loss with minimal global disc bulge. Mild
bilateral facet arthropathy. No significant canal stenosis. At most
mild bilateral foraminal narrowing.

L5-S1: Moderate disc height loss and shallow global disc bulge with
moderate bilateral facet arthropathy. No significant canal stenosis
or foraminal narrowing.
IMPRESSION: 1. No acute fracture or traumatic malalignment of the lumbar spine.
2. Contusive changes of the left flank and superficial to the
paraspinal musculature, better detailed on dedicated CT of the
abdomen and pelvis from which this study is reconstructed.
3. Multilevel discogenic and facet degenerative changes, as detailed
above. Features maximal at L3-4 with mild-to-moderate canal
stenosis, moderate bilateral foraminal narrowing and effacement of
the lateral recesses.

These results were called by telephone at the time of interpretation
on [DATE] at [DATE] to provider GEN, who verbally
acknowledged these results.

## 2019-07-14 IMAGING — DX DG FINGER THUMB 2+V*L*
3 series · 3 of 3 positions shown · non-contrast
Comparison: None.

CLINICAL DATA: Pain

EXAM:
LEFT THUMB 2+V

[finger ap]
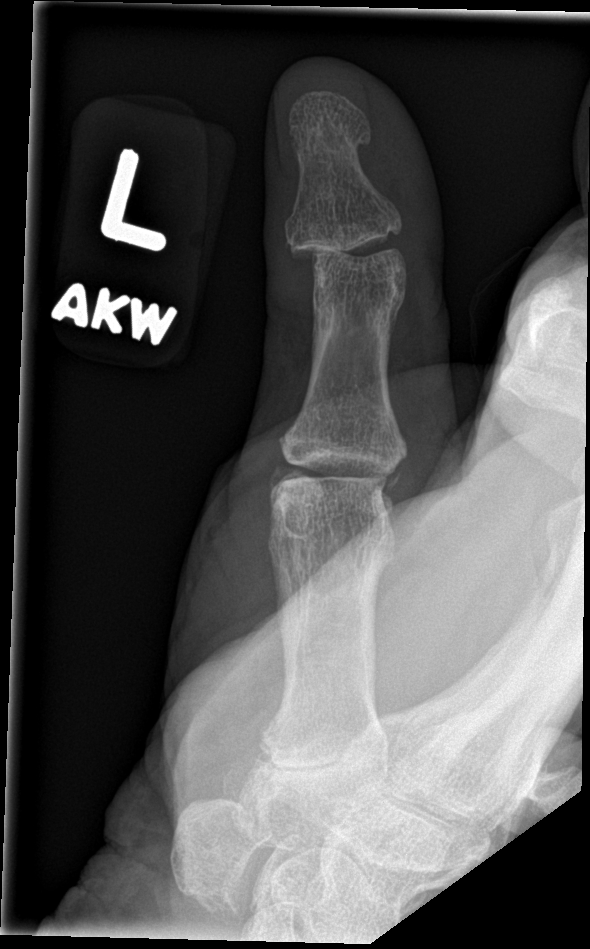

[finger obl]
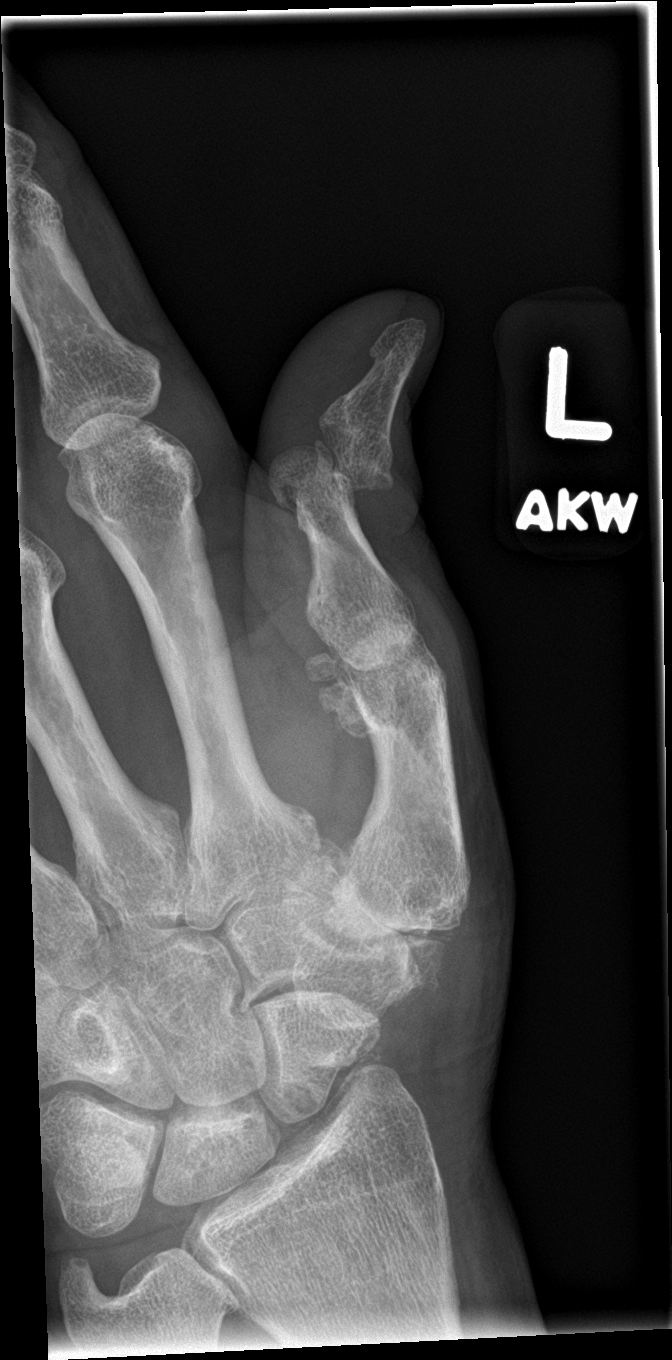

[finger lat]
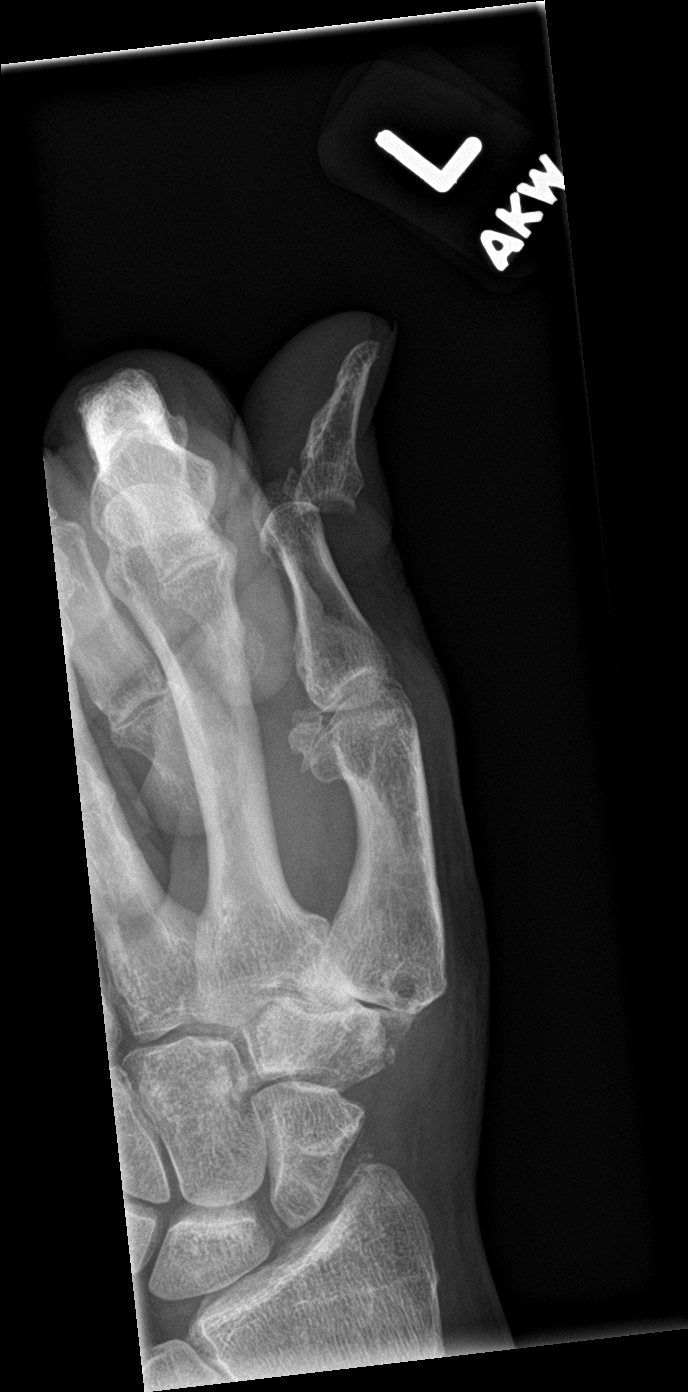

[3 of 3 positions shown; findings below may reference images not displayed]

FINDINGS: There is dislocation of the interphalangeal joint of the first
digit. There is surrounding soft tissue swelling. There are advanced
degenerative changes of the first carpometacarpal joint. There is an
osseous fragment adjacent to the metacarpophalangeal joint which may
represent a small avulsion fracture.
IMPRESSION: 1. Dislocation of the interphalangeal joint of the first digit.
2. Possible avulsion fracture at the metacarpophalangeal joint.
3. Advanced degenerative changes of the first carpometacarpal joint.

## 2019-07-14 IMAGING — CT CT CHEST W/O CM
2 of 4 series · 14 of 36 positions shown, 17 images · non-contrast
Comparison: [DATE], [DATE]

CLINICAL DATA: Fell down stairs, back pain

EXAM:
CT CHEST WITHOUT CONTRAST
TECHNIQUE: Multidetector CT imaging of the chest was performed following the
standard protocol without IV contrast.

[Series 3: chest wo · axial · 0.72mm/px · z∈[-546,-282]mm · 11 of 158 slices shown, 14 images]
[im 13/158  mediastinal]
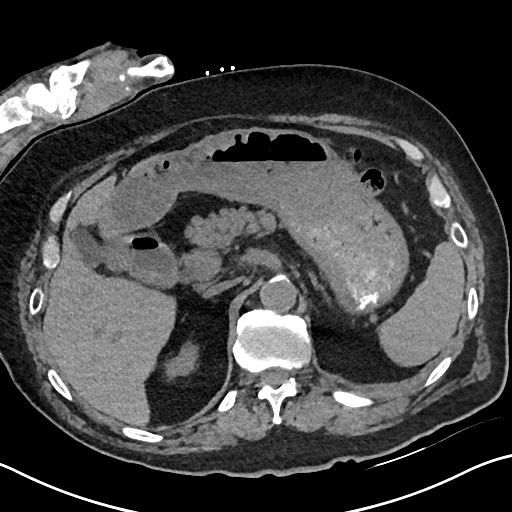
[im 13/158  lung]
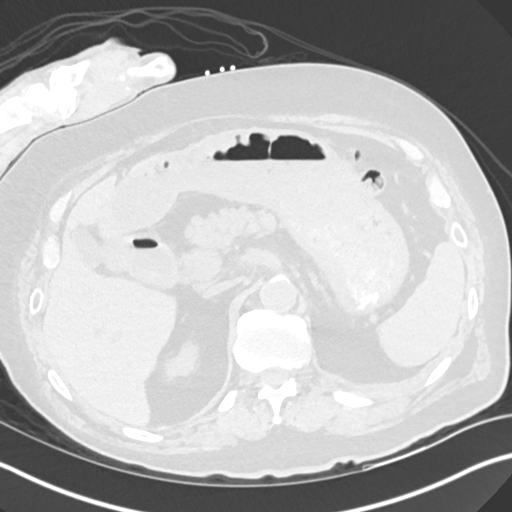
[im 25/158  lung]
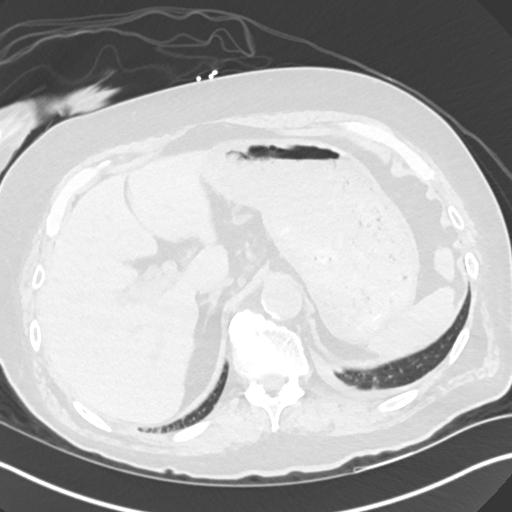
[im 37/158  lung]
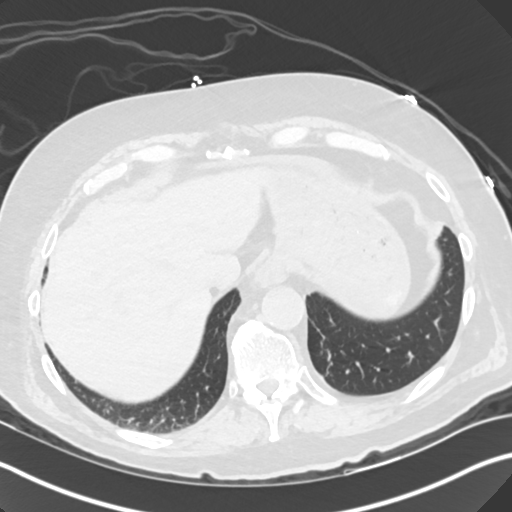
[im 49/158  lung]
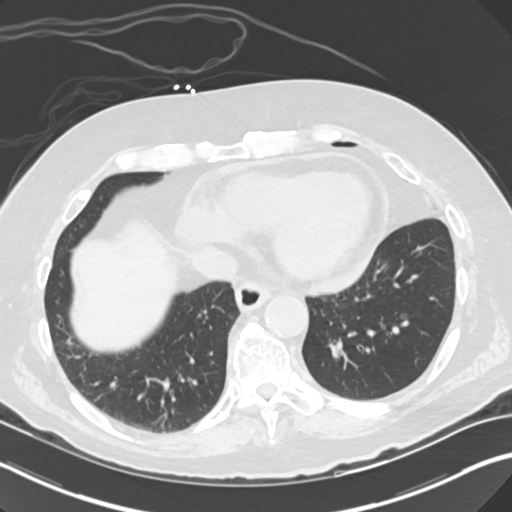
[im 61/158  mediastinal]
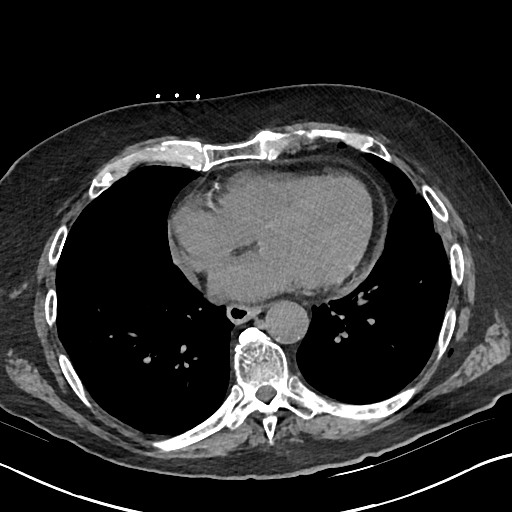
[im 61/158  lung]
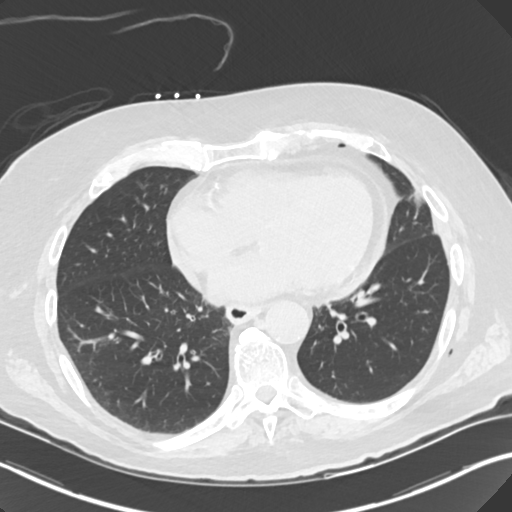
[im 85/158  lung]
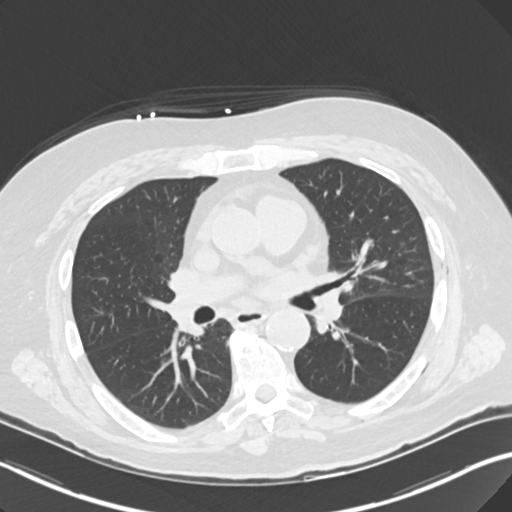
[im 97/158  lung]
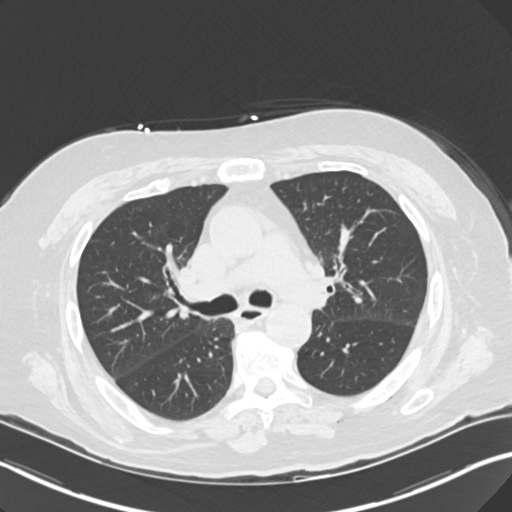
[im 109/158  lung]
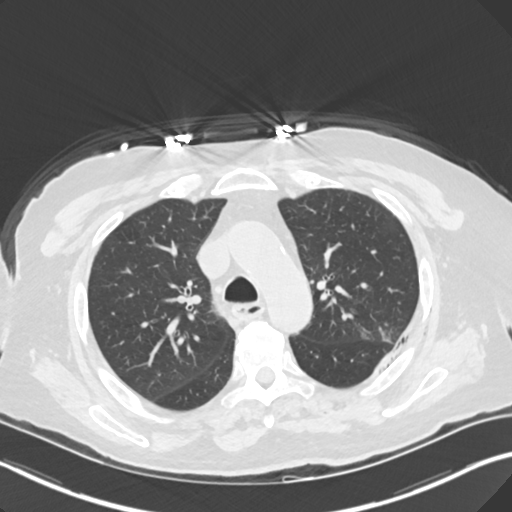
[im 121/158  mediastinal]
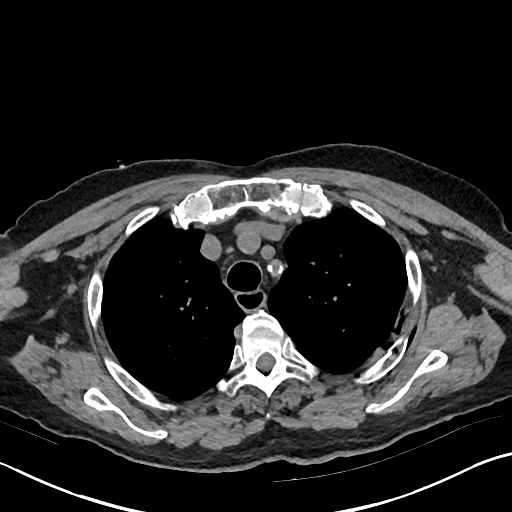
[im 121/158  lung]
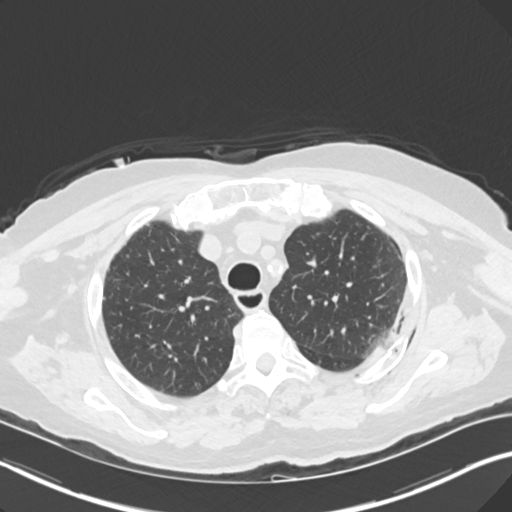
[im 133/158  lung]
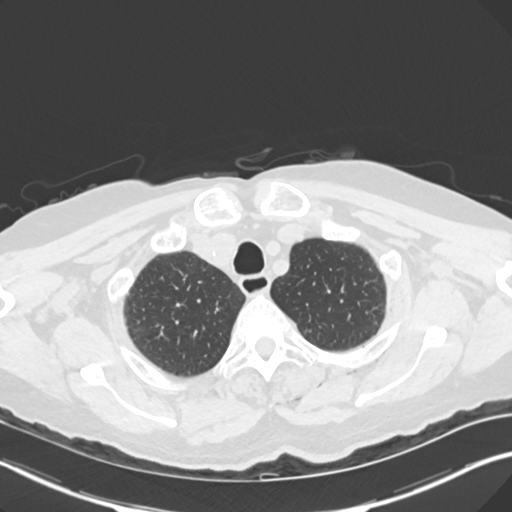
[im 145/158  lung]
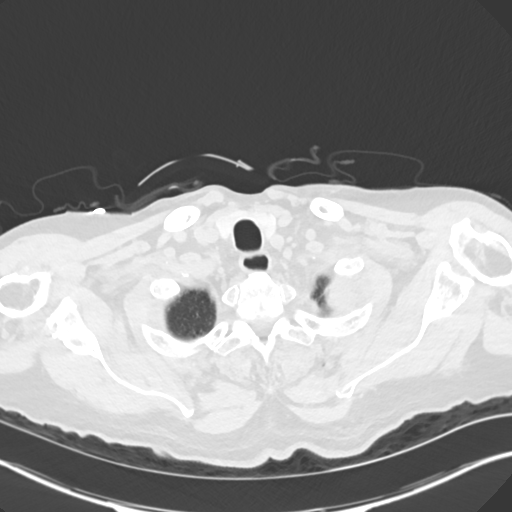

[Series 6: cor · coronal · 0.62mm/px · 3 of 142 slices shown]
[im 29/142  lung]
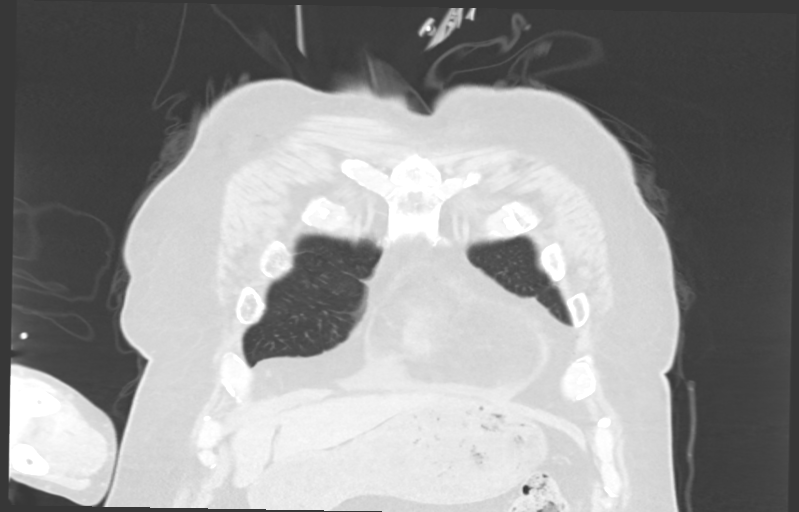
[im 57/142  lung]
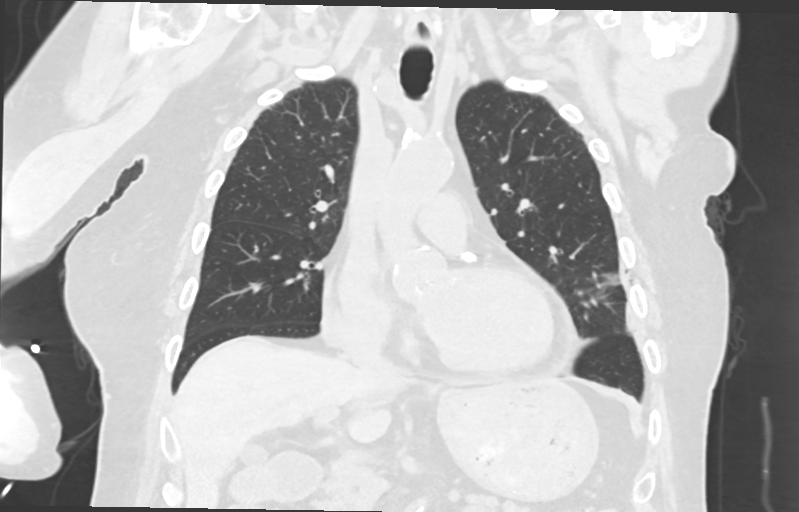
[im 85/142  lung]
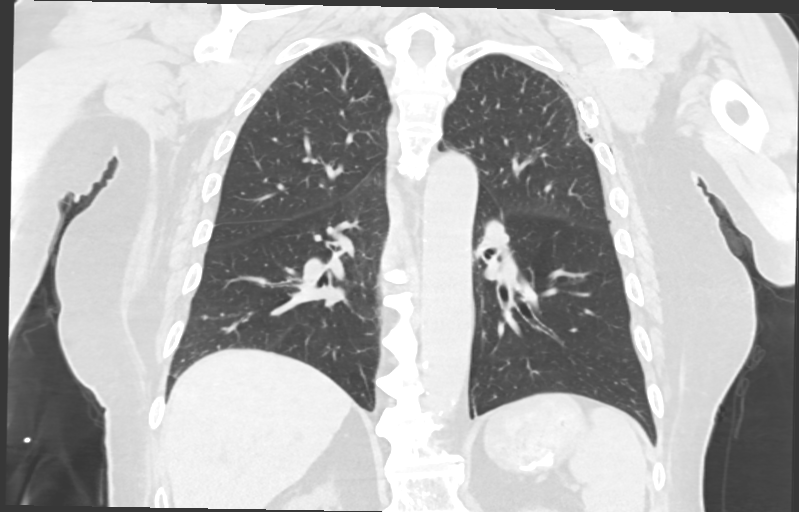

[14 of 36 positions shown; findings below may reference images not displayed]

FINDINGS: Cardiovascular: Unenhanced imaging of the heart and great vessels
demonstrates trace pericardial fluid. The heart is not enlarged.
There is extensive atherosclerosis of the aorta and coronary
vessels. Evaluation of the vascular lumen is limited without IV
contrast.

Mediastinum/Nodes: No enlarged mediastinal or axillary lymph nodes.
Thyroid gland, trachea, and esophagus demonstrate no significant
findings.

Lungs/Pleura: There is a trace left-sided pneumothorax adjacent to
displaced left first through seventh rib fractures, with punctate
foci of gas seen within the left pleural space. Volume estimated far
less than 5%.

There is a trace left pleural effusion. Patchy consolidation within
the periphery of the left upper lobe consistent with contusion.

Upper Abdomen: No acute abnormality.

Musculoskeletal: There are minimally displaced left posterolateral
first through seventh rib fractures. The distal left clavicle
fracture seen on chest x-ray is not included on this exam due to
slice selection. Reconstructed images demonstrate no additional
findings.
IMPRESSION: 1. Trace left-sided pneumothorax, with punctate foci of gas seen in
the pleural space adjacent to multiple left-sided rib fractures.
Volume estimated far less than 5%.
2. Trace left pleural effusion.
3. Minimally displaced left posterior first through seventh rib
fractures.
4. Minimal left upper lobe contusion.
5.  Aortic Atherosclerosis ([VD]-[VD]).

## 2019-07-14 IMAGING — DX DG HAND 2V*L*
1 series · 2 of 2 positions shown · non-contrast
Comparison: Radiograph earlier this day.

CLINICAL DATA: Thumb dislocation, postreduction.

EXAM:
LEFT HAND - 2 VIEW

[Series 1: hand · 0.14mm/px · 2 of 2 slices shown]
[im 1/2]
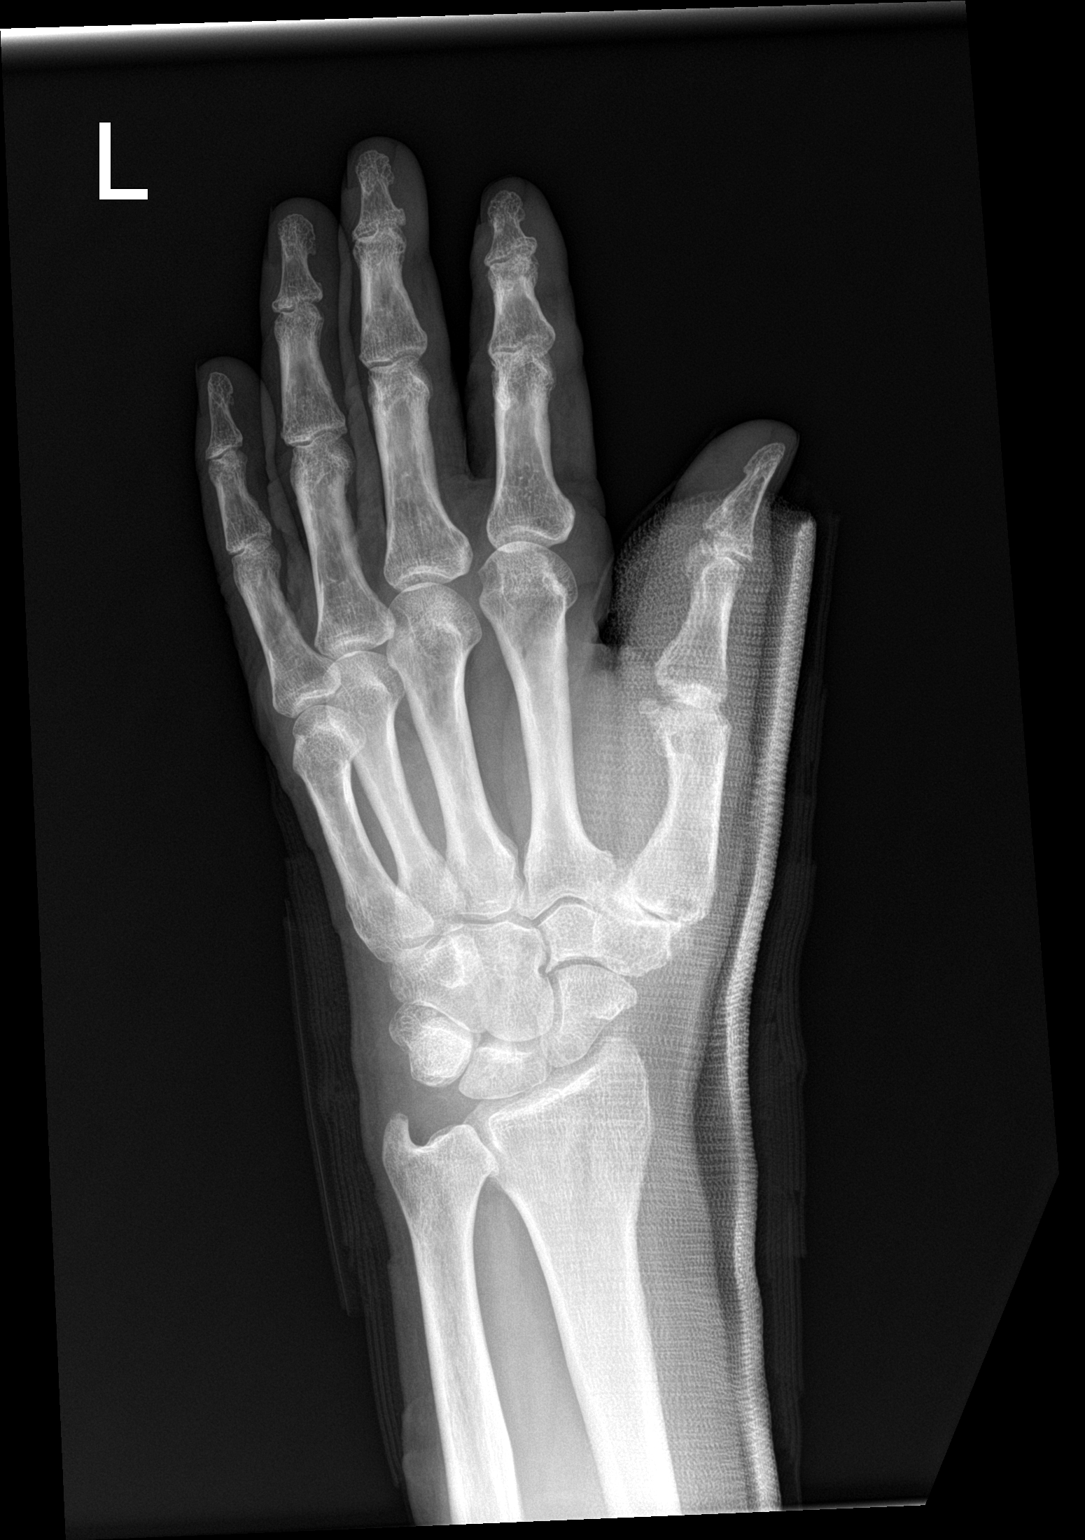
[im 2/2]
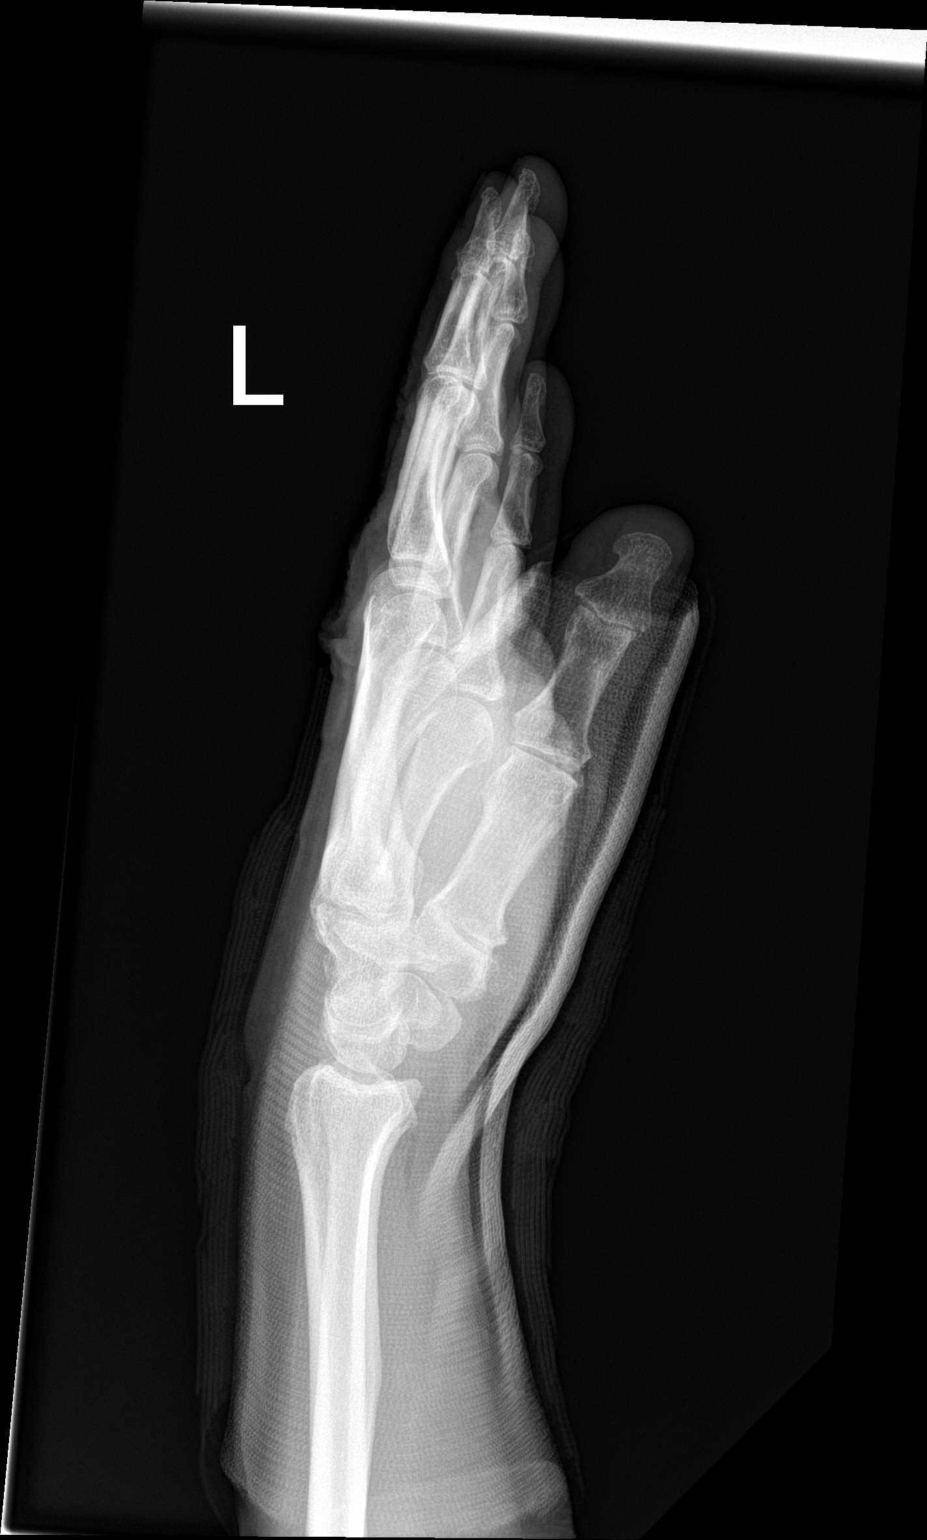

[2 of 2 positions shown; findings below may reference images not displayed]

FINDINGS: Improved alignment of the thumb interphalangeal joint post
reduction. Small fracture fragment about the proximal phalanx is
obscured on the current exam. Overlying splint material in place
limiting osseous and soft tissue fine detail.
IMPRESSION: Improved alignment of the thumb interphalangeal joint post
reduction. Small fracture fragment about the proximal phalanx is
obscured on the current exam.

## 2019-07-14 MED ORDER — FENTANYL CITRATE (PF) 100 MCG/2ML IJ SOLN
INTRAMUSCULAR | Status: AC
  Filled 2019-07-14: qty 2

## 2019-07-14 MED ORDER — FENTANYL CITRATE (PF) 100 MCG/2ML IJ SOLN
50.0000 ug | Freq: Once | INTRAMUSCULAR | Status: AC
  Administered 2019-07-14: 50 ug via INTRAVENOUS
  Filled 2019-07-14: qty 2

## 2019-07-14 MED ORDER — FENTANYL CITRATE (PF) 100 MCG/2ML IJ SOLN
INTRAMUSCULAR | Status: AC | PRN
  Administered 2019-07-14: 50 ug via INTRAVENOUS

## 2019-07-14 MED ORDER — OXYCODONE HCL 5 MG PO TABS
5.0000 mg | ORAL_TABLET | ORAL | Status: DC | PRN
  Administered 2019-07-14 – 2019-07-19 (×14): 5 mg via ORAL
  Filled 2019-07-14 (×13): qty 1

## 2019-07-14 NOTE — Progress Notes (Signed)
Orthopedic Tech Progress Note Patient Details:  [name] Mar 08, 1933 [id number]  Ortho Devices Type of Ortho Device: Thumb spica splint Splint Material: Fiberglass Ortho Device/Splint Location: ULE Ortho Device/Splint Interventions: Ordered, Application   Post Interventions Patient Tolerated: Well Instructions Provided: Care of device   Molley Houser A Keylee Shrestha 07/14/2019, 10:44 PM

## 2019-07-14 NOTE — ED Notes (Signed)
Pt transferred to CT.

## 2019-07-14 NOTE — ED Provider Notes (Addendum)
[facility] EMERGENCY DEPARTMENT Provider Note   CSN: [id number] Arrival date & time: 07/14/19  2009     History Chief Complaint  Patient presents with  . Fall    [name] is a 86 y.o. male.  HPI    Patient presents as a level 2 trauma. The patient was in his usual state of health, when he fell.  Patient fell down approximately 12 stairs, striking his head, face, torso multiple times. It seems as though there was loss of consciousness, but EMS reports that on arrival the patient was awake, alert.  Patient was complaining of pain in his head, shoulder primarily.  On arrival the patient states that these areas hurt the most, primarily the shoulder, which is sore, severe, worse with motion. Unclear if the patient received any medication in route. Patient does not take any blood thinning medication, states that he is generally healthy individual. He denies any weakness in any extremity, denies vision changes, denies confusion, denies neck pain. Also denies shortness of breath, abdominal pain, nausea, vomiting.  History reviewed. No pertinent past medical history.  There are no problems to display for this patient.   History reviewed. No pertinent surgical history.     No family history on file.  Social History   Tobacco Use  . Smoking status: Never Smoker  . Smokeless tobacco: Never Used  Substance Use Topics  . Alcohol use: Never  . Drug use: Never    Home Medications Prior to Admission medications   Not on File    Allergies    Patient has no known allergies.  Review of Systems   Review of Systems  Constitutional:       Per HPI, otherwise negative  HENT:       Per HPI, otherwise negative  Respiratory:       Per HPI, otherwise negative  Cardiovascular:       Per HPI, otherwise negative  Gastrointestinal: Negative for vomiting.  Endocrine:       Negative aside from HPI  Genitourinary:       Neg aside from HPI     Musculoskeletal:       Per HPI, otherwise negative  Skin: Positive for wound.  Neurological: Negative for syncope.    Physical Exam Updated Vital Signs BP 130/60 Comment: manual  Pulse 62   Temp (!) 97.5 F (36.4 C) Comment: oral  Resp 18   SpO2 97% Comment: RA  Physical Exam Vitals and nursing note reviewed.  Constitutional:      Appearance: He is well-developed.     Comments: Elderly male with obvious trauma, multiple wounds to his head, left arm in sling, cervical collar in place.  HENT:     Head: Normocephalic.   Eyes:     Conjunctiva/sclera: Conjunctivae normal.  Cardiovascular:     Rate and Rhythm: Normal rate and regular rhythm.  Pulmonary:     Effort: Pulmonary effort is normal. No respiratory distress.     Breath sounds: No stridor.  Chest:    Abdominal:     General: There is no distension.     Tenderness: There is no abdominal tenderness. There is no guarding.  Musculoskeletal:     Right shoulder: Normal.     Left shoulder: Swelling, deformity, tenderness and bony tenderness present. No crepitus. Decreased range of motion. Normal strength. Normal pulse.     Left elbow: Normal.     Left wrist: Normal.     Left hand: Deformity  present.       Arms:  Skin:    General: Skin is warm and dry.  Neurological:     Mental Status: He is alert and oriented to person, place, and time.     Cranial Nerves: Cranial nerves are intact. No dysarthria.     Motor: Atrophy present. No tremor or abnormal muscle tone.      ED Results / Procedures / Treatments   Labs (all labs ordered are listed, but only abnormal results are displayed) Labs Reviewed  COMPREHENSIVE METABOLIC PANEL - Abnormal; Notable for the following components:      Result Value   Glucose, Bld 161 (*)    BUN 33 (*)    Creatinine, Ser 1.97 (*)    Total Protein 6.2 (*)    GFR calc non Af Amer 31 (*)    GFR calc Af Amer 35 (*)    All other components within normal limits  CBC - Abnormal; Notable  for the following components:   RBC 3.54 (*)    Hemoglobin 11.4 (*)    HCT 35.1 (*)    All other components within normal limits  I-STAT CHEM 8, ED - Abnormal; Notable for the following components:   BUN 36 (*)    Creatinine, Ser 2.10 (*)    Glucose, Bld 154 (*)    Calcium, Ion 1.06 (*)    Hemoglobin 10.9 (*)    HCT 32.0 (*)    All other components within normal limits  SARS CORONAVIRUS 2 BY RT PCR (HOSPITAL ORDER, [place] [facility])  ETHANOL  LACTIC ACID, PLASMA  PROTIME-INR  URINALYSIS, ROUTINE W REFLEX MICROSCOPIC  SAMPLE TO BLOOD BANK    Radiology CT HEAD WO CONTRAST  Result Date: 07/14/2019 CLINICAL DATA:  Status post fall down stairs with a blow to the head. Headache. Initial encounter. EXAM: CT HEAD WITHOUT CONTRAST TECHNIQUE: Contiguous axial images were obtained from the base of the skull through the vertex without intravenous contrast. COMPARISON:  Head CT scan 08/31/2007. FINDINGS: Brain: A small amount of subarachnoid hemorrhage is seen on the left and best visualized along the left insula and high left parietal lobe. No subdural hemorrhage, mass, hydrocephalus, pneumocephalus or midline shift. No intraventricular blood is identified. Vascular: No hyperdense vessel or unexpected calcification. Skull: Intact.  No focal lesion. Sinuses/Orbits: Mucosal thickening in the maxillary sinuses is worse on the right. Ethmoid air cell disease also noted. Other: Scalp contusion over the left parietal bone is noted. IMPRESSION: Small volume of subarachnoid hemorrhage on the left. Scalp contusion on the left without underlying fracture is also seen. Sinus disease. Critical Value/emergent results were called by telephone at the time of interpretation on 07/14/2019 at 8:48 pm to provider Carmin Muskrat , who verbally acknowledged these results. Electronically Signed   By: Inge Rise M.D.   On: 07/14/2019 20:52   CT Chest Wo Contrast  Result Date: 07/14/2019 CLINICAL  DATA:  [place] down stairs, back pain EXAM: CT CHEST WITHOUT CONTRAST TECHNIQUE: Multidetector CT imaging of the chest was performed following the standard protocol without IV contrast. COMPARISON:  07/14/2019, 07/29/2006 FINDINGS: Cardiovascular: Unenhanced imaging of the heart and great vessels demonstrates trace pericardial fluid. The heart is not enlarged. There is extensive atherosclerosis of the aorta and coronary vessels. Evaluation of the vascular lumen is limited without IV contrast. Mediastinum/Nodes: No enlarged mediastinal or axillary lymph nodes. Thyroid gland, trachea, and esophagus demonstrate no significant findings. Lungs/Pleura: There is a trace left-sided pneumothorax adjacent to  displaced left first through seventh rib fractures, with punctate foci of gas seen within the left pleural space. Volume estimated far less than 5%. There is a trace left pleural effusion. Patchy consolidation within the periphery of the left upper lobe consistent with contusion. Upper Abdomen: No acute abnormality. Musculoskeletal: There are minimally displaced left posterolateral first through seventh rib fractures. The distal left clavicle fracture seen on chest x-ray is not included on this exam due to slice selection. Reconstructed images demonstrate no additional findings. IMPRESSION: 1. Trace left-sided pneumothorax, with punctate foci of gas seen in the pleural space adjacent to multiple left-sided rib fractures. Volume estimated far less than 5%. 2. Trace left pleural effusion. 3. Minimally displaced left posterior first through seventh rib fractures. 4. Minimal left upper lobe contusion. 5.  Aortic Atherosclerosis (ICD10-I70.0). Electronically Signed   By: Randa Ngo M.D.   On: 07/14/2019 21:02   CT CERVICAL SPINE WO CONTRAST  Result Date: 07/14/2019 CLINICAL DATA:  Status post fall down stairs today. Initial encounter. EXAM: CT CERVICAL SPINE WITHOUT CONTRAST TECHNIQUE: Multidetector CT imaging of the  cervical spine was performed without intravenous contrast. Multiplanar CT image reconstructions were also generated. COMPARISON:  None. FINDINGS: Alignment: Normal. Skull base and vertebrae: No acute fracture. No primary bone lesion or focal pathologic process. Soft tissues and spinal canal: No prevertebral fluid or swelling. No visible canal hematoma. Disc levels: Scattered facet degenerative change is worst on the left at C2-3 and C3-4. Mild multilevel disc bulging is also seen. Upper chest: Lung apices clear. Other: None. IMPRESSION: No acute abnormality. Mild degenerative disease. Electronically Signed   By: Inge Rise M.D.   On: 07/14/2019 20:57   DG Pelvis Portable  Result Date: 07/14/2019 CLINICAL DATA:  [place] down stairs. EXAM: PORTABLE PELVIS 1-2 VIEWS COMPARISON:  None. FINDINGS: There is no evidence of pelvic fracture or diastasis. No pelvic bone lesions are seen. IMPRESSION: Negative. Electronically Signed   By: Nelson Chimes M.D.   On: 07/14/2019 20:29   DG Chest Portable 1 View  Addendum Date: 07/14/2019   ADDENDUM REPORT: 07/14/2019 20:32 ADDENDUM: There is a fracture of the distal left clavicle. Electronically Signed   By: Nelson Chimes M.D.   On: 07/14/2019 20:32   Result Date: 07/14/2019 CLINICAL DATA:  [place] down stairs. Pain in the left shoulder and hips. EXAM: PORTABLE CHEST 1 VIEW COMPARISON:  04/24/2017 FINDINGS: Enlarged cardiac silhouette. Mediastinal shadows are normal. Question atelectasis or infiltrate at the left lung base. No pneumothorax or hemothorax. There appear to be fractures of the left second third and fourth ribs, age indeterminate. IMPRESSION: Cardiomegaly.  Question left base infiltrate or atelectasis. Abnormal appearance of the left posterolateral second, third and fourth ribs, age indeterminate. Consider chest CT or rib detail films. Electronically Signed: By: Nelson Chimes M.D. On: 07/14/2019 20:29   DG Shoulder Left Portable  Result Date: 07/14/2019 CLINICAL  DATA:  [place] down stairs. EXAM: LEFT SHOULDER COMPARISON:  None. FINDINGS: Humeral head is normally located. No fracture of the humerus, or scapula. There is a fracture of the distal clavicle. There appear to be fractures of the left second, third, fourth and possibly fifth ribs. I favor these are acute. IMPRESSION: Probably acute fractures of the left second through fifth ribs posteriorly/laterally. Distal clavicular fracture. Electronically Signed   By: Nelson Chimes M.D.   On: 07/14/2019 20:31   DG Finger Thumb Left  Result Date: 07/14/2019 CLINICAL DATA:  Pain EXAM: LEFT THUMB 2+V COMPARISON:  None. FINDINGS: There  is dislocation of the interphalangeal joint of the first digit. There is surrounding soft tissue swelling. There are advanced degenerative changes of the first carpometacarpal joint. There is an osseous fragment adjacent to the metacarpophalangeal joint which may represent a small avulsion fracture. IMPRESSION: 1. Dislocation of the interphalangeal joint of the first digit. 2. Possible avulsion fracture at the metacarpophalangeal joint. 3. Advanced degenerative changes of the first carpometacarpal joint. Electronically Signed   By: Constance Holster M.D.   On: 07/14/2019 21:32    Procedures .Ortho Injury Treatment  Date/Time: 07/14/2019 10:30 PM Performed by: Carmin Muskrat, MD Authorized by: Carmin Muskrat, MD   Consent:    Consent obtained:  Verbal   Consent given by:  Patient   Risks discussed:  Fracture, nerve damage and irreducible dislocation   Alternatives discussed:  No treatment Universal protocol:    Procedure explained and questions answered to patient or proxy's satisfaction: yes     Relevant documents present and verified: yes     Test results available and properly labeled: yes     Imaging studies available: yes     Required blood products, implants, devices, and special equipment available: yes     Site/side marked: yes     Immediately prior to procedure a time  out was called: yes     Patient identity confirmed:  Verbally with patientInjury location: finger Location details: left thumb Injury type: fracture-dislocation Fracture type: distal phalanx Any IP joint involved: yes Pre-procedure distal perfusion: normal Pre-procedure neurological function: diminished Pre-procedure range of motion: reduced  Anesthesia: Local anesthesia used: no (Fentanyl 50 given just prior to procedure)  Patient sedated: NoManipulation performed: yes Skeletal traction used: yes Reduction successful: yes X-ray confirmed reduction: yes Immobilization: splint Splint type: thumb spica Supplies used: Ortho-Glass Post-procedure neurovascular assessment: post-procedure neurovascularly intact Post-procedure distal perfusion: normal Post-procedure neurological function: normal Post-procedure range of motion: improved Patient tolerance: patient tolerated the procedure well with no immediate complications    (including critical care time)  Medications Ordered in ED Medications  fentaNYL (SUBLIMAZE) injection 50 mcg (has no administration in time range)  fentaNYL (SUBLIMAZE) injection (50 mcg Intravenous Given 07/14/19 2021)    ED Course  I have reviewed the triage vital signs and the nursing notes.  Pertinent labs & imaging results that were available during my care of the patient were reviewed by me and considered in my medical decision making (see chart for details).  I reviewed the point-of-care x-ray, pelvis, chest, concern for fracture.  Patient remains with no hypoxia. Given concern for intracranial damage, neck fracture, rib fractures, the patient will have CT head, neck, chest.  9:22 PM I discussed the patient's CT results with our radiologist, and with our trauma surgeon.  The patient found of subarachnoid hemorrhage. On repeat exam the patient remains awake and alert, with no hypoxia. He and his wife are aware of all findings He now indicates that his  left thumb he is immobile.  On exam patient can move the proximal aspect of the thumb, but not the distal, concerning for fracture or dislocation. X-ray pending.   Update:, Patient has had additional x-rays, and I discussed this case with our trauma surgeon. Additional x-rays, CTs notable for left-sided pneumothorax, 8 rib fractures. Cervical spine CT reassuring, head CT otherwise reassuring aside from small subdural hematoma hemorrhage.   Patient has tolerated procedure well, but now on repeat exam states that he has some pain in his posterior gluteal region. X-ray was unremarkable given the mechanism, however,  the patientwill have CT scan performed. 11:36 PM Department for CT of his pelvis.  I discussed this case with our neurosurgical colleague, Dr. Christella Noa. On discussing the patient's status with his wife following the initial fall, she notes that he does have some repetitive statements, though this is not entirely unusual for him. He continues to be awake, alert, moving all extremities spontaneously.    MDM Rules/Calculators/A&P MDM Number of Diagnoses or Management Options Closed fracture of multiple ribs of left side, initial encounter: new, needed workup Closed nondisplaced fracture of acromial end of left clavicle, initial encounter: new, needed workup Dislocation of left thumb, initial encounter: new, needed workup Injury of head, initial encounter: new, needed workup SDH (subdural hematoma) ([place]): new, needed workup Trauma: new, needed workup   Amount and/or Complexity of Data Reviewed Clinical lab tests: reviewed Tests in the radiology section of CPT: reviewed Tests in the medicine section of CPT: reviewed Discussion of test results with the performing providers: yes Decide to obtain previous medical records or to obtain history from someone other than the patient: yes Obtain history from someone other than the patient: yes Discuss the patient with other providers:  yes Independent visualization of images, tracings, or specimens: yes  Risk of Complications, Morbidity, and/or Mortality Presenting problems: high Diagnostic procedures: high Management options: high  Critical Care Total time providing critical care: 30-74 minutes  Patient Progress Patient progress: improved  Final Clinical Impression(s) / ED Diagnoses Final diagnoses:  Trauma  Injury of head, initial encounter  SDH (subdural hematoma) ([place])  Dislocation of left thumb, initial encounter  Closed nondisplaced fracture of acromial end of left clavicle, initial encounter  Closed fracture of multiple ribs of left side, initial encounter     Carmin Muskrat, MD 07/14/19 [id number]    Carmin Muskrat, MD 07/14/19 2338

## 2019-07-14 NOTE — ED Notes (Signed)
Myrtle wife

## 2019-07-14 NOTE — ED Triage Notes (Signed)
Pt brought to ED by [facility] from home after missing 4 steps on a stairs, EMS report some LOC unable to know if pt has LOC prior to fall or after, initial [facility] for EMS 14, [facility] on the way to ED and arrival 15. Few laceration noticed on forehead, pt c/o 6/10 left shoulder pain and hip pain, tender to touch. BP 126/90, HR 60, R 18, SPO2 94% CBG 144. Pt is AO x 4, no neuro deficit noticed on arrival, denies any blood thinner medication.,

## 2019-07-14 NOTE — ED Notes (Signed)
EMS C collar removed and Aspen collar applied per surgeon's order, per surgeon pt is only able to have some ice chips for the next 12 to 24 hours

## 2019-07-14 NOTE — Consult Note (Signed)
Responded to page, ,pt unavailable, no family present, staff will page again if further need of chaplain services.  Rev. Eloise Levels Chaplain

## 2019-07-14 NOTE — H&P (Signed)
[name] is an 86 y.o. male.   Chief Complaint: fall down stairs HPI: 16 yom with prior mi, htn presents after unwitnessed fall down stairs.  Does not remember event. Has some history of previous falls.  He complains of left sided back/chest pain and pain while having a deep breath.  His wife is with him I was asked to see him after evaluation with ct/xrays  Past Medical History:  Diagnosis Date  . BPH (benign prostatic hyperplasia)   . Coronary artery disease   . High cholesterol   . Hypertension   . MI (myocardial infarction) ([place])   . Prediabetes     Past Surgical History:  Procedure Laterality Date  . APPENDECTOMY    . CARDIAC CATHETERIZATION    . EXPLORATORY LAPAROTOMY      No family history on file. Social History:  reports that he has never smoked. He has never used smokeless tobacco. He reports that he does not drink alcohol or use drugs.  Allergies: No Known Allergies  meds will await reconciliation, only remembers albuterol now  Results for orders placed or performed during the hospital encounter of 07/14/19 (from the past 48 hour(s))  Sample to Blood Bank     Status: None   Collection Time: 07/14/19  8:20 PM  Result Value Ref Range   Blood Bank Specimen SAMPLE AVAILABLE FOR TESTING    Sample Expiration      07/15/2019,2359 Performed at [facility], [place] [address], [place], [place] [address]   Comprehensive metabolic panel     Status: Abnormal   Collection Time: 07/14/19  8:22 PM  Result Value Ref Range   Sodium 139 135 - 145 mmol/L   Potassium 4.5 3.5 - 5.1 mmol/L   Chloride 105 98 - 111 mmol/L   CO2 23 22 - 32 mmol/L   Glucose, Bld 161 (H) 70 - 99 mg/dL    Comment: Glucose reference range applies only to samples taken after fasting for at least 8 hours.   BUN 33 (H) 8 - 23 mg/dL   Creatinine, Ser 1.97 (H) 0.61 - 1.24 mg/dL   Calcium 9.0 8.9 - 10.3 mg/dL   Total Protein 6.2 (L) 6.5 - 8.1 g/dL   Albumin 3.5 3.5 - 5.0 g/dL   AST 39 15 - 41  U/L   ALT 27 0 - 44 U/L   Alkaline Phosphatase 71 38 - 126 U/L   Total Bilirubin 0.7 0.3 - 1.2 mg/dL   GFR calc non Af Amer 31 (L) >60 mL/min   GFR calc Af Amer 35 (L) >60 mL/min   Anion gap 11 5 - 15    Comment: Performed at [facility] [address] [place], [place] [address]  CBC     Status: Abnormal   Collection Time: 07/14/19  8:22 PM  Result Value Ref Range   WBC 6.7 4.0 - 10.5 K/uL   RBC 3.54 (L) 4.22 - 5.81 MIL/uL   Hemoglobin 11.4 (L) 13.0 - 17.0 g/dL   HCT 35.1 (L) 39.0 - 52.0 %   MCV 99.2 80.0 - 100.0 fL   MCH 32.2 26.0 - 34.0 pg   MCHC 32.5 30.0 - 36.0 g/dL   RDW 12.7 11.5 - 15.5 %   Platelets 194 150 - 400 K/uL   nRBC 0.0 0.0 - 0.2 %    Comment: Performed at [facility], [place] [address], [place], [place] [address]  Ethanol     Status: None  Collection Time: 07/14/19  8:22 PM  Result Value Ref Range   Alcohol, Ethyl (B) <10 <10 mg/dL    Comment: (NOTE) Lowest detectable limit for serum alcohol is 10 mg/dL. For medical purposes only. Performed at [facility], [place] [address], [place], [place] [address]   Lactic acid, plasma     Status: None   Collection Time: 07/14/19  8:22 PM  Result Value Ref Range   Lactic Acid, Venous 1.0 0.5 - 1.9 mmol/L    Comment: Performed at [facility] [address] [place], [place] [address]  Protime-INR     Status: None   Collection Time: 07/14/19  8:22 PM  Result Value Ref Range   Prothrombin Time 12.8 11.4 - 15.2 seconds   INR 1.0 0.8 - 1.2    Comment: (NOTE) INR goal varies based on device and disease states. Performed at [facility], [place] [address], [place], [place] [address]   I-Stat Chem 8, ED     Status: Abnormal   Collection Time: 07/14/19  8:33 PM  Result Value Ref Range   Sodium 139 135 - 145 mmol/L   Potassium 4.3 3.5 - 5.1 mmol/L   Chloride 103 98 - 111 mmol/L   BUN 36 (H) 8 - 23 mg/dL   Creatinine, Ser 2.10 (H) 0.61 - 1.24 mg/dL   Glucose, Bld 154 (H) 70 - 99 mg/dL     Comment: Glucose reference range applies only to samples taken after fasting for at least 8 hours.   Calcium, Ion 1.06 (L) 1.15 - 1.40 mmol/L   TCO2 25 22 - 32 mmol/L   Hemoglobin 10.9 (L) 13.0 - 17.0 g/dL   HCT 32.0 (L) 39.0 - 52.0 %   CT HEAD WO CONTRAST  Result Date: 07/14/2019 CLINICAL DATA:  Status post fall down stairs with a blow to the head. Headache. Initial encounter. EXAM: CT HEAD WITHOUT CONTRAST TECHNIQUE: Contiguous axial images were obtained from the base of the skull through the vertex without intravenous contrast. COMPARISON:  Head CT scan 08/31/2007. FINDINGS: Brain: A small amount of subarachnoid hemorrhage is seen on the left and best visualized along the left insula and high left parietal lobe. No subdural hemorrhage, mass, hydrocephalus, pneumocephalus or midline shift. No intraventricular blood is identified. Vascular: No hyperdense vessel or unexpected calcification. Skull: Intact.  No focal lesion. Sinuses/Orbits: Mucosal thickening in the maxillary sinuses is worse on the right. Ethmoid air cell disease also noted. Other: Scalp contusion over the left parietal bone is noted. IMPRESSION: Small volume of subarachnoid hemorrhage on the left. Scalp contusion on the left without underlying fracture is also seen. Sinus disease. Critical Value/emergent results were called by telephone at the time of interpretation on 07/14/2019 at 8:48 pm to provider Carmin Muskrat , who verbally acknowledged these results. Electronically Signed   By: Inge Rise M.D.   On: 07/14/2019 20:52   CT Chest Wo Contrast  Result Date: 07/14/2019 CLINICAL DATA:  [place] down stairs, back pain EXAM: CT CHEST WITHOUT CONTRAST TECHNIQUE: Multidetector CT imaging of the chest was performed following the standard protocol without IV contrast. COMPARISON:  07/14/2019, 07/29/2006 FINDINGS: Cardiovascular: Unenhanced imaging of the heart and great vessels demonstrates trace pericardial fluid. The heart is not enlarged.  There is extensive atherosclerosis of the aorta and coronary vessels. Evaluation of the vascular lumen is limited without IV contrast. Mediastinum/Nodes: No enlarged mediastinal or axillary lymph nodes. Thyroid gland, trachea, and esophagus demonstrate no significant findings. Lungs/Pleura: There  is a trace left-sided pneumothorax adjacent to displaced left first through seventh rib fractures, with punctate foci of gas seen within the left pleural space. Volume estimated far less than 5%. There is a trace left pleural effusion. Patchy consolidation within the periphery of the left upper lobe consistent with contusion. Upper Abdomen: No acute abnormality. Musculoskeletal: There are minimally displaced left posterolateral first through seventh rib fractures. The distal left clavicle fracture seen on chest x-ray is not included on this exam due to slice selection. Reconstructed images demonstrate no additional findings. IMPRESSION: 1. Trace left-sided pneumothorax, with punctate foci of gas seen in the pleural space adjacent to multiple left-sided rib fractures. Volume estimated far less than 5%. 2. Trace left pleural effusion. 3. Minimally displaced left posterior first through seventh rib fractures. 4. Minimal left upper lobe contusion. 5.  Aortic Atherosclerosis (ICD10-I70.0). Electronically Signed   By: Randa Ngo M.D.   On: 07/14/2019 21:02   CT CERVICAL SPINE WO CONTRAST  Result Date: 07/14/2019 CLINICAL DATA:  Status post fall down stairs today. Initial encounter. EXAM: CT CERVICAL SPINE WITHOUT CONTRAST TECHNIQUE: Multidetector CT imaging of the cervical spine was performed without intravenous contrast. Multiplanar CT image reconstructions were also generated. COMPARISON:  None. FINDINGS: Alignment: Normal. Skull base and vertebrae: No acute fracture. No primary bone lesion or focal pathologic process. Soft tissues and spinal canal: No prevertebral fluid or swelling. No visible canal hematoma. Disc  levels: Scattered facet degenerative change is worst on the left at C2-3 and C3-4. Mild multilevel disc bulging is also seen. Upper chest: Lung apices clear. Other: None. IMPRESSION: No acute abnormality. Mild degenerative disease. Electronically Signed   By: Inge Rise M.D.   On: 07/14/2019 20:57   DG Pelvis Portable  Result Date: 07/14/2019 CLINICAL DATA:  [place] down stairs. EXAM: PORTABLE PELVIS 1-2 VIEWS COMPARISON:  None. FINDINGS: There is no evidence of pelvic fracture or diastasis. No pelvic bone lesions are seen. IMPRESSION: Negative. Electronically Signed   By: Nelson Chimes M.D.   On: 07/14/2019 20:29   DG Chest Portable 1 View  Addendum Date: 07/14/2019   ADDENDUM REPORT: 07/14/2019 20:32 ADDENDUM: There is a fracture of the distal left clavicle. Electronically Signed   By: Nelson Chimes M.D.   On: 07/14/2019 20:32   Result Date: 07/14/2019 CLINICAL DATA:  [place] down stairs. Pain in the left shoulder and hips. EXAM: PORTABLE CHEST 1 VIEW COMPARISON:  04/24/2017 FINDINGS: Enlarged cardiac silhouette. Mediastinal shadows are normal. Question atelectasis or infiltrate at the left lung base. No pneumothorax or hemothorax. There appear to be fractures of the left second third and fourth ribs, age indeterminate. IMPRESSION: Cardiomegaly.  Question left base infiltrate or atelectasis. Abnormal appearance of the left posterolateral second, third and fourth ribs, age indeterminate. Consider chest CT or rib detail films. Electronically Signed: By: Nelson Chimes M.D. On: 07/14/2019 20:29   DG Shoulder Left Portable  Result Date: 07/14/2019 CLINICAL DATA:  [place] down stairs. EXAM: LEFT SHOULDER COMPARISON:  None. FINDINGS: Humeral head is normally located. No fracture of the humerus, or scapula. There is a fracture of the distal clavicle. There appear to be fractures of the left second, third, fourth and possibly fifth ribs. I favor these are acute. IMPRESSION: Probably acute fractures of the left second  through fifth ribs posteriorly/laterally. Distal clavicular fracture. Electronically Signed   By: Nelson Chimes M.D.   On: 07/14/2019 20:31   DG Finger Thumb Left  Result Date: 07/14/2019 CLINICAL DATA:  Pain EXAM: LEFT  THUMB 2+V COMPARISON:  None. FINDINGS: There is dislocation of the interphalangeal joint of the first digit. There is surrounding soft tissue swelling. There are advanced degenerative changes of the first carpometacarpal joint. There is an osseous fragment adjacent to the metacarpophalangeal joint which may represent a small avulsion fracture. IMPRESSION: 1. Dislocation of the interphalangeal joint of the first digit. 2. Possible avulsion fracture at the metacarpophalangeal joint. 3. Advanced degenerative changes of the first carpometacarpal joint. Electronically Signed   By: Constance Holster M.D.   On: 07/14/2019 21:32    Review of Systems  Respiratory: Positive for shortness of breath.   Cardiovascular: Positive for chest pain (posterior left).  Neurological: Positive for headaches.  All other systems reviewed and are negative.   Blood pressure 123/61, pulse (!) 58, temperature (!) 97.5 F (36.4 C), resp. rate (!) 22, height 5\' 7"  (1.702 m), weight 99.8 kg, SpO2 96 %. Physical Exam  Vitals reviewed. Constitutional: He is oriented to person, place, and time. He appears well-developed and well-nourished.  HENT:  Head: Normocephalic and atraumatic.  Right Ear: External ear normal.  Left Ear: External ear normal.  Mouth/Throat: Oropharynx is clear and moist.  Eyes: Pupils are equal, round, and reactive to light. EOM are normal.  Neck: No JVD present. No tracheal deviation present.  Cardiovascular: Normal rate, regular rhythm, normal heart sounds and intact distal pulses.  Respiratory: Effort normal and breath sounds normal. He exhibits tenderness (left posterior).  GI: Soft. There is no abdominal tenderness.  Musculoskeletal:        General: Tenderness (left thumb)  present. No deformity.     Cervical back: Muscular tenderness present. No spinous process tenderness.  Lymphadenopathy:    He has no cervical adenopathy.  Neurological: He is alert and oriented to person, place, and time. He has normal strength. No sensory deficit. GCS eye subscore is 4. GCS verbal subscore is 5. GCS motor subscore is 6.  Skin: Skin is warm and dry. No rash noted.  Psychiatric: He has a normal mood and affect. His behavior is normal.     Assessment/Plan Fall Distal clavicle fx/left thumb fx dl-ortho consult, sling SAH-repeat ct in am nsurg consult, neuro checks q 4 Left posterior 1-7 rib fx/tr ptx- pain control, pulm toilet, repeat cxr in am Hold lovenox for now Will await home meds and then restart Discussed plan with patient and wife   Rolm Bookbinder, MD 07/14/2019, 10:18 PM

## 2019-07-14 NOTE — Progress Notes (Signed)
Orthopedic Tech Progress Note Patient Details:  [name] 03-12-1933 [id number] Level 2 Trauma not needed. Patient ID: [name], male   DOB: 1933/04/24, 86 y.o.   MRN: [id number]   [name] 07/14/2019, 8:43 PM

## 2019-07-15 ENCOUNTER — Inpatient Hospital Stay (HOSPITAL_COMMUNITY): Payer: PPO

## 2019-07-15 LAB — BASIC METABOLIC PANEL
Anion gap: 8 (ref 5–15)
BUN: 36 mg/dL — ABNORMAL HIGH (ref 8–23)
CO2: 22 mmol/L (ref 22–32)
Calcium: 8.5 mg/dL — ABNORMAL LOW (ref 8.9–10.3)
Chloride: 107 mmol/L (ref 98–111)
Creatinine, Ser: 1.85 mg/dL — ABNORMAL HIGH (ref 0.61–1.24)
GFR calc Af Amer: 38 mL/min — ABNORMAL LOW (ref >60)
GFR calc non Af Amer: 33 mL/min — ABNORMAL LOW (ref >60)
Glucose, Bld: 197 mg/dL — ABNORMAL HIGH (ref 70–99)
Potassium: 4.6 mmol/L (ref 3.5–5.1)
Sodium: 137 mmol/L (ref 135–145)

## 2019-07-15 LAB — CBC
HCT: 29 % — ABNORMAL LOW (ref 39.0–52.0)
HCT: 28.4 % — ABNORMAL LOW (ref 39.0–52.0)
Hemoglobin: 9.4 g/dL — ABNORMAL LOW (ref 13.0–17.0)
Hemoglobin: 9.4 g/dL — ABNORMAL LOW (ref 13.0–17.0)
MCH: 32.1 pg (ref 26.0–34.0)
MCH: 31.9 pg (ref 26.0–34.0)
MCHC: 32.4 g/dL (ref 30.0–36.0)
MCHC: 33.1 g/dL (ref 30.0–36.0)
MCV: 99 fL (ref 80.0–100.0)
MCV: 96.3 fL (ref 80.0–100.0)
Platelets: 158 10*3/uL (ref 150–400)
Platelets: 147 10*3/uL — ABNORMAL LOW (ref 150–400)
RBC: 2.93 MIL/uL — ABNORMAL LOW (ref 4.22–5.81)
RBC: 2.95 MIL/uL — ABNORMAL LOW (ref 4.22–5.81)
RDW: 12.6 % (ref 11.5–15.5)
RDW: 12.6 % (ref 11.5–15.5)
WBC: 7.2 10*3/uL (ref 4.0–10.5)
WBC: 6.7 10*3/uL (ref 4.0–10.5)
nRBC: 0 % (ref 0.0–0.2)
nRBC: 0 % (ref 0.0–0.2)

## 2019-07-15 LAB — URINALYSIS, ROUTINE W REFLEX MICROSCOPIC
Bilirubin Urine: NEGATIVE
Glucose, UA: NEGATIVE mg/dL
Hgb urine dipstick: NEGATIVE
Ketones, ur: NEGATIVE mg/dL
Leukocytes,Ua: NEGATIVE
Nitrite: NEGATIVE
Protein, ur: NEGATIVE mg/dL
Specific Gravity, Urine: 1.018 (ref 1.005–1.030)
pH: 5 (ref 5.0–8.0)

## 2019-07-15 LAB — HEMOGLOBIN A1C
Hgb A1c MFr Bld: 6.5 % — ABNORMAL HIGH (ref 4.8–5.6)
Mean Plasma Glucose: 139.85 mg/dL

## 2019-07-15 LAB — GLUCOSE, CAPILLARY: Glucose-Capillary: 87 mg/dL (ref 70–99)

## 2019-07-15 LAB — CBG MONITORING, ED: Glucose-Capillary: 204 mg/dL — ABNORMAL HIGH (ref 70–99)

## 2019-07-15 IMAGING — CR DG CERVICAL SPINE FLEX&EXT ONLY
2 series · 2 of 2 positions shown · non-contrast
Comparison: CT from previous day

CLINICAL DATA: Recent fall with neck pain, initial encounter

EXAM:
CERVICAL SPINE - FLEXION AND EXTENSION VIEWS ONLY

[c-spine flex]
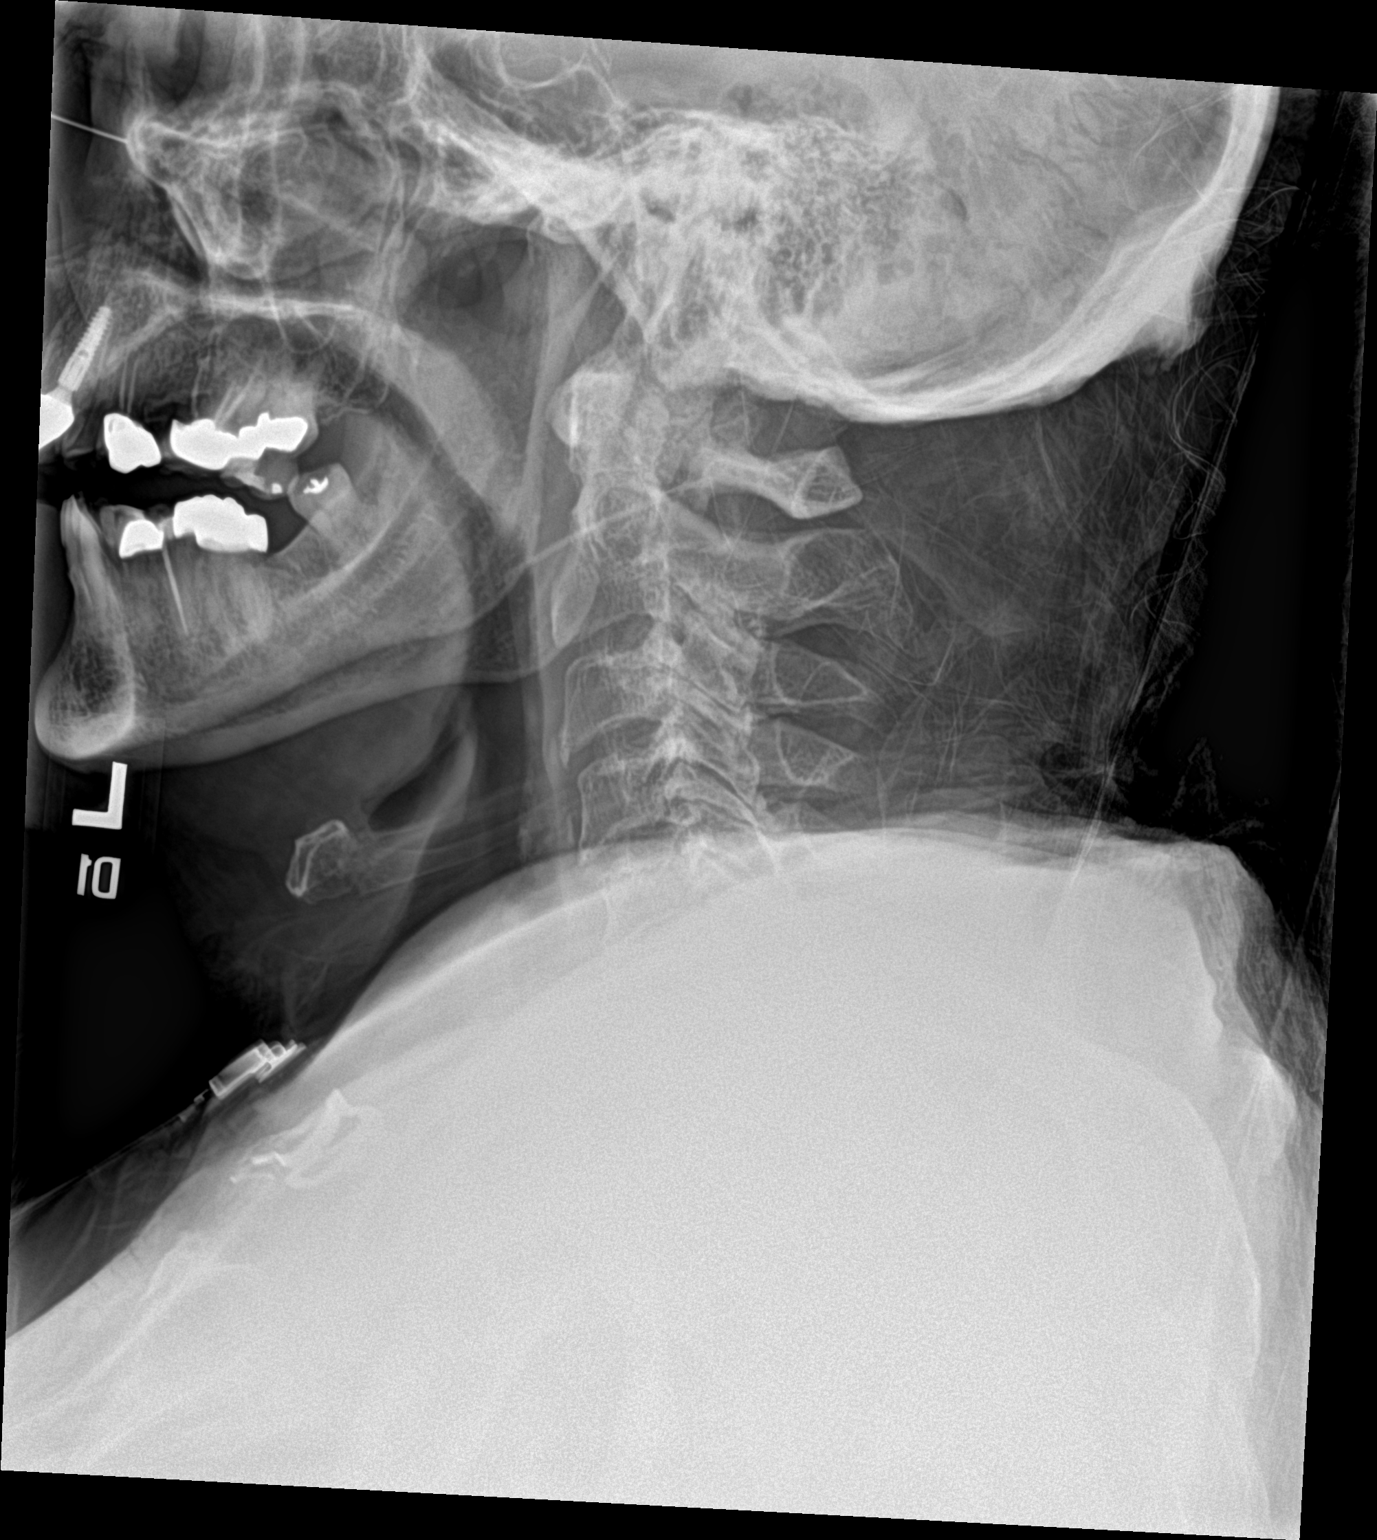

[c-spine ext]
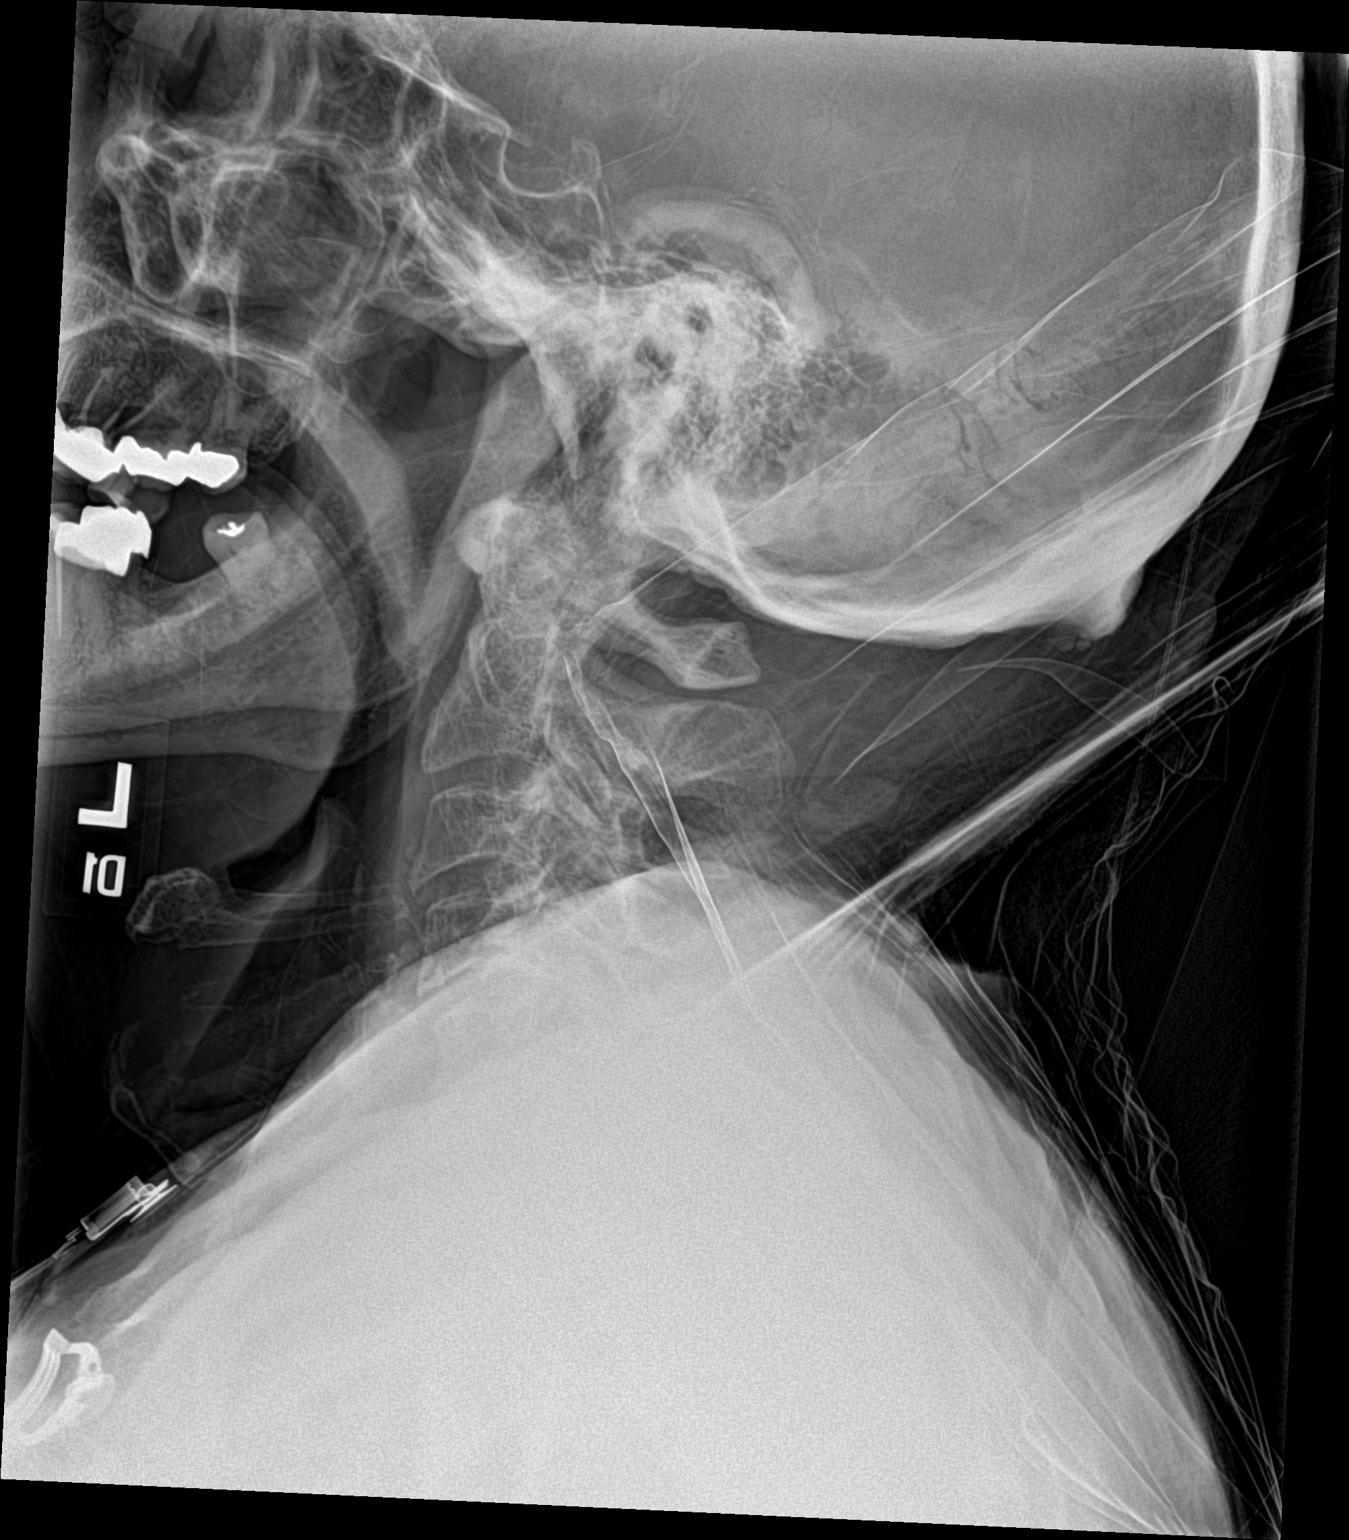

[2 of 2 positions shown; findings below may reference images not displayed]

FINDINGS: Examination is significantly limited by overlying soft tissue
density related to the shoulders. The cervical spine is visualized
to C5. Flexion and extension views show no significant instability.
Multilevel degenerative changes noted similar to that seen on recent
CT.
IMPRESSION: Limited exam without significant instability.

## 2019-07-15 MED ORDER — ACETAMINOPHEN 500 MG PO TABS
500.0000 mg | ORAL_TABLET | Freq: Four times a day (QID) | ORAL | Status: DC
  Administered 2019-07-15: 500 mg via ORAL
  Filled 2019-07-15: qty 1

## 2019-07-15 MED ORDER — OXYCODONE HCL 5 MG PO TABS: 10.0000 mg | ORAL_TABLET | ORAL | Status: DC | PRN

## 2019-07-15 MED ORDER — METHOCARBAMOL 500 MG PO TABS
1000.0000 mg | ORAL_TABLET | Freq: Three times a day (TID) | ORAL | Status: DC
  Administered 2019-07-15 – 2019-07-23 (×24): 1000 mg via ORAL
  Filled 2019-07-15 (×25): qty 2

## 2019-07-15 MED ORDER — LIDOCAINE 5 % EX PTCH
1.0000 | MEDICATED_PATCH | CUTANEOUS | Status: DC
  Administered 2019-07-15 – 2019-07-23 (×8): 1 via TRANSDERMAL
  Filled 2019-07-15 (×9): qty 1

## 2019-07-15 MED ORDER — METHOCARBAMOL 500 MG PO TABS: 500.0000 mg | ORAL_TABLET | Freq: Three times a day (TID) | ORAL | Status: DC

## 2019-07-15 MED ORDER — AMLODIPINE BESYLATE 5 MG PO TABS
5.0000 mg | ORAL_TABLET | Freq: Every day | ORAL | Status: DC
  Administered 2019-07-15 – 2019-07-17 (×3): 5 mg via ORAL
  Filled 2019-07-15 (×6): qty 1

## 2019-07-15 MED ORDER — DOXAZOSIN MESYLATE 2 MG PO TABS
2.0000 mg | ORAL_TABLET | Freq: Every day | ORAL | Status: DC
  Administered 2019-07-16 – 2019-07-23 (×4): 2 mg via ORAL
  Filled 2019-07-15 (×10): qty 1

## 2019-07-15 MED ORDER — SODIUM CHLORIDE 0.9 % IV SOLN: INTRAVENOUS | Status: DC

## 2019-07-15 MED ORDER — ALBUTEROL SULFATE (2.5 MG/3ML) 0.083% IN NEBU: 2.5000 mg | INHALATION_SOLUTION | RESPIRATORY_TRACT | Status: DC | PRN

## 2019-07-15 MED ORDER — BENAZEPRIL HCL 20 MG PO TABS
20.0000 mg | ORAL_TABLET | Freq: Every day | ORAL | Status: DC
  Administered 2019-07-16 – 2019-07-21 (×3): 20 mg via ORAL
  Filled 2019-07-15 (×6): qty 1

## 2019-07-15 MED ORDER — ONDANSETRON HCL 4 MG/2ML IJ SOLN
4.0000 mg | Freq: Four times a day (QID) | INTRAMUSCULAR | Status: DC | PRN
  Administered 2019-07-15: 4 mg via INTRAVENOUS
  Filled 2019-07-15: qty 2

## 2019-07-15 MED ORDER — ONDANSETRON 4 MG PO TBDP: 4.0000 mg | ORAL_TABLET | Freq: Four times a day (QID) | ORAL | Status: DC | PRN

## 2019-07-15 MED ORDER — ACETAMINOPHEN 500 MG PO TABS
1000.0000 mg | ORAL_TABLET | Freq: Four times a day (QID) | ORAL | Status: DC
  Administered 2019-07-15 – 2019-07-23 (×29): 1000 mg via ORAL
  Filled 2019-07-15 (×30): qty 2

## 2019-07-15 MED ORDER — ESCITALOPRAM OXALATE 10 MG PO TABS
10.0000 mg | ORAL_TABLET | Freq: Every day | ORAL | Status: DC
  Administered 2019-07-16 – 2019-07-23 (×8): 10 mg via ORAL
  Filled 2019-07-15 (×8): qty 1

## 2019-07-15 MED ORDER — AMLODIPINE BESY-BENAZEPRIL HCL 5-20 MG PO CAPS: 1.0000 | ORAL_CAPSULE | Freq: Every day | ORAL | Status: DC

## 2019-07-15 MED ORDER — FLUTICASONE PROPIONATE 50 MCG/ACT NA SUSP: 1.0000 | Freq: Every day | NASAL | Status: DC | PRN

## 2019-07-15 MED ORDER — MORPHINE SULFATE (PF) 2 MG/ML IV SOLN
1.0000 mg | INTRAVENOUS | Status: DC | PRN
  Administered 2019-07-15 (×3): 1 mg via INTRAVENOUS
  Filled 2019-07-15 (×3): qty 1

## 2019-07-15 MED ORDER — ALBUTEROL SULFATE HFA 108 (90 BASE) MCG/ACT IN AERS
1.0000 | INHALATION_SPRAY | RESPIRATORY_TRACT | Status: DC | PRN
  Filled 2019-07-15: qty 6.7

## 2019-07-15 MED ORDER — INSULIN ASPART 100 UNIT/ML ~~LOC~~ SOLN
0.0000 [IU] | Freq: Three times a day (TID) | SUBCUTANEOUS | Status: DC
  Administered 2019-07-15: 3 [IU] via SUBCUTANEOUS
  Administered 2019-07-16: 2 [IU] via SUBCUTANEOUS
  Administered 2019-07-17 – 2019-07-19 (×5): 1 [IU] via SUBCUTANEOUS
  Administered 2019-07-23: 2 [IU] via SUBCUTANEOUS

## 2019-07-15 NOTE — Progress Notes (Addendum)
Subjective: Patient having a lot of pain in his back.  He states it is from his neck down to his buttock.  All on the left side.  Minimal dizziness.  Wife at bedside said he has been repetitive some.  Has some pain in his thumb.  No complaints of much pain in his head except on the left side where he hit it.  Has some chronic kidney dysfunction per daughter.  ROS: See above, otherwise other systems negative  Objective: Vital signs in last 24 hours: Temp:  [97.5 F (36.4 C)] 97.5 F (36.4 C) (06/08 2020) Pulse Rate:  [45-83] 71 (06/09 0745) Resp:  [12-24] 19 (06/09 0745) BP: (100-135)/(45-76) 126/49 (06/09 0600) SpO2:  [91 %-98 %] 95 % (06/09 0745) Weight:  [99.8 kg] 99.8 kg (06/08 2120)    Intake/Output from previous day: No intake/output data recorded. Intake/Output this shift: No intake/output data recorded.  PE: Gen: NAD HEENT: dried blood on the left side of his head from small laceration.  PERRL Neck: [place] J collar in place.  No midline tenderness to palpation but he has midline pain with mobilization of his neck.  No obvious thyromegaly.  Trachea midline Heart: regular Lungs: CTAB, chest wall is very tender around left flank and posterior left chest Abd: soft, tender on left flank with ecchymosis present along this part of his abdominal wall, +BS, ND GU: urine is clear and yellow Ext: MAE, left wrist and thumb in splint.  Wiggles fingers, good cap refill.  Neuro: cranial nerves II-XII grossly intact, sensation normal throughout Psych: A&Ox3.  Lab Results:  Recent Labs    07/14/19 2022 07/14/19 2022 07/14/19 2033 07/15/19 0617  WBC 6.7  --   --  7.2  HGB 11.4*   < > 10.9* 9.4*  HCT 35.1*   < > 32.0* 29.0*  PLT 194  --   --  158   < > = values in this interval not displayed.   BMET Recent Labs    07/14/19 2022 07/14/19 2022 07/14/19 2033 07/15/19 0617  NA 139   < > 139 137  K 4.5   < > 4.3 4.6  CL 105   < > 103 107  CO2 23  --   --  22  GLUCOSE  161*   < > 154* 197*  BUN 33*   < > 36* 36*  CREATININE 1.97*   < > 2.10* 1.85*  CALCIUM 9.0  --   --  8.5*   < > = values in this interval not displayed.   PT/INR Recent Labs    07/14/19 2022  LABPROT 12.8  INR 1.0   CMP     Component Value Date/Time   NA 137 07/15/2019 0617   K 4.6 07/15/2019 0617   CL 107 07/15/2019 0617   CO2 22 07/15/2019 0617   GLUCOSE 197 (H) 07/15/2019 0617   BUN 36 (H) 07/15/2019 0617   CREATININE 1.85 (H) 07/15/2019 0617   CALCIUM 8.5 (L) 07/15/2019 0617   PROT 6.2 (L) 07/14/2019 2022   ALBUMIN 3.5 07/14/2019 2022   AST 39 07/14/2019 2022   ALT 27 07/14/2019 2022   ALKPHOS 71 07/14/2019 2022   BILITOT 0.7 07/14/2019 2022   GFRNONAA 33 (L) 07/15/2019 0617   GFRAA 38 (L) 07/15/2019 0617   Lipase  No results found for: LIPASE     Studies/Results: CT Abdomen Pelvis Wo Contrast  Addendum Date: 07/15/2019   ADDENDUM REPORT: 07/15/2019 00:19  ADDENDUM: Additional comparison: Same day CT Chest 3.1 cm infrarenal abdominal aortic aneurysm. Recommend followup by ultrasound in 3 years. This recommendation follows ACR consensus guidelines: White Paper of the ACR Incidental Findings Committee II on Vascular Findings. J Am Coll Radiol 2013; 10:789-794. Aortic aneurysm NOS (ICD10-I71.9) These results and addendum were called by telephone at the time of interpretation on 07/15/2019 at 12:18 am to provider Donne Hazel, who verbally acknowledged these results. Electronically Signed   By: Lovena Le M.D.   On: 07/15/2019 00:19   Result Date: 07/15/2019 CLINICAL DATA:  Fall down 14 stairs, abdominal bruising EXAM: CT ABDOMEN AND PELVIS WITHOUT CONTRAST TECHNIQUE: Multidetector CT imaging of the abdomen and pelvis was performed following the standard protocol without IV contrast. COMPARISON:  CT 10/15/2012, contemporary lumbar reconstructions. FINDINGS: Lower chest: Contusion and possible pulmonary laceration within the lingula (1/4) is seen adjacent lateral left sixth,  seventh and eighth rib fractures. Small amount of pleural fluid, likely hemorrhage. Small anterior pneumothorax (4/18). Additional contusive changes along the left chest wall. Mild cardiomegaly. Extensive three-vessel coronary artery calcifications. Small pericardial effusion. Hepatobiliary: No visible direct hepatic injury. No perihepatic hematoma. No visible lesions on this noncontrast exam. Normal a [place] attenuation. Smooth surface contour. Normal gallbladder and biliary tree. Pancreas: No contusive pancreatic changes or evidence of ductal disruption. No peripancreatic inflammation or visible pancreatic lesion on noncontrast study. Spleen: No direct splenic injury or perisplenic hemorrhage. Adrenals/Urinary Tract: No adrenal hemorrhage or suspicious adrenal lesions. There is mild bilateral perinephric stranding but with more focal stranding seen adjacent the posterior left kidney within the perirenal fat. Evaluation for a direct injury is limited in the absence of contrast media. No visible or contour deforming renal lesion. No urolithiasis or hydronephrosis. Indentation of the bladder base by an enlarged prostate with median lobe hypertrophy. Stomach/Bowel: Distal esophagus, stomach and duodenal sweep are unremarkable. No small bowel wall thickening or dilatation. No evidence of obstruction. Appendix is not visualized. No focal inflammation the vicinity of the cecum to suggest an occult appendicitis. No colonic dilatation or wall thickening. Scattered colonic diverticula without focal pericolonic inflammation to suggest diverticulitis. Vascular/Lymphatic: Atherosclerotic calcifications throughout the abdominal aorta and branch vessels. Focal abdominal aortic ectasia measuring up to 3.1 cm in the infrarenal abdominal aorta (1/41, 6/78). No enlarged abdominopelvic lymph nodes. Reproductive: Prostatomegaly with few coarse prostate calcifications. Other: Extensive left flank contusive change and body wall  hematoma superficial to the left paraspinal musculature and gluteal musculature. Musculoskeletal: Dedicated lumbar reconstructions generated and dictated separately. Please see report for further details of the lumbar spine. No acute fracture of the bony pelvis. Advanced degenerative changes in the bilateral hips. Mild degenerative changes at the SI joints and symphysis pubis. Enthesopathy throughout the pelvis. No suspicious osseous lesions. The osseous structures appear diffusely demineralized which may limit detection of small or nondisplaced fractures. Arthrosis of the left wrist incidentally included within the level of imaging. Some calcification of the triangular fibrocartilage noted as well possibly chondrocalcinosis/CPPD. IMPRESSION: 1. Extensive left flank contusive change and body wall hematoma superficial to the left paraspinal musculature and gluteal musculature. Hematoma measures up to 13 mm in maximal thickness (1/50). 2. Contusion and possible pulmonary laceration within the lingula with adjacent left sixth, seventh and eighth rib fractures. Small amount of pleural fluid, likely hemorrhage. Small anterior left pneumothorax. 3. Mild bilateral perinephric stranding but with more focal stranding, possibly reflecting contusive change, seen adjacent the posterior left kidney within the perirenal fat. Evaluation for a direct injury is limited in  the absence of contrast media. 4. Dedicated lumbar reconstructions were generated and dictated separately. Please see full report for further details. 5. No other acute traumatic injuries in the abdomen or pelvis. 6. Prostatomegaly with indentation of the bladder base. Correlate for symptoms of outlet obstruction. 7. Colonic diverticulosis without evidence of diverticulitis. 8. Advanced degenerative changes in the bilateral hips. 9. Aortic Atherosclerosis (ICD10-I70.0). Electronically Signed: By: Lovena Le M.D. On: 07/15/2019 00:12   CT HEAD WO CONTRAST   Result Date: 07/14/2019 CLINICAL DATA:  Status post fall down stairs with a blow to the head. Headache. Initial encounter. EXAM: CT HEAD WITHOUT CONTRAST TECHNIQUE: Contiguous axial images were obtained from the base of the skull through the vertex without intravenous contrast. COMPARISON:  Head CT scan 08/31/2007. FINDINGS: Brain: A small amount of subarachnoid hemorrhage is seen on the left and best visualized along the left insula and high left parietal lobe. No subdural hemorrhage, mass, hydrocephalus, pneumocephalus or midline shift. No intraventricular blood is identified. Vascular: No hyperdense vessel or unexpected calcification. Skull: Intact.  No focal lesion. Sinuses/Orbits: Mucosal thickening in the maxillary sinuses is worse on the right. Ethmoid air cell disease also noted. Other: Scalp contusion over the left parietal bone is noted. IMPRESSION: Small volume of subarachnoid hemorrhage on the left. Scalp contusion on the left without underlying fracture is also seen. Sinus disease. Critical Value/emergent results were called by telephone at the time of interpretation on 07/14/2019 at 8:48 pm to provider Carmin Muskrat , who verbally acknowledged these results. Electronically Signed   By: Inge Rise M.D.   On: 07/14/2019 20:52   CT Chest Wo Contrast  Result Date: 07/14/2019 CLINICAL DATA:  [place] down stairs, back pain EXAM: CT CHEST WITHOUT CONTRAST TECHNIQUE: Multidetector CT imaging of the chest was performed following the standard protocol without IV contrast. COMPARISON:  07/14/2019, 07/29/2006 FINDINGS: Cardiovascular: Unenhanced imaging of the heart and great vessels demonstrates trace pericardial fluid. The heart is not enlarged. There is extensive atherosclerosis of the aorta and coronary vessels. Evaluation of the vascular lumen is limited without IV contrast. Mediastinum/Nodes: No enlarged mediastinal or axillary lymph nodes. Thyroid gland, trachea, and esophagus demonstrate no  significant findings. Lungs/Pleura: There is a trace left-sided pneumothorax adjacent to displaced left first through seventh rib fractures, with punctate foci of gas seen within the left pleural space. Volume estimated far less than 5%. There is a trace left pleural effusion. Patchy consolidation within the periphery of the left upper lobe consistent with contusion. Upper Abdomen: No acute abnormality. Musculoskeletal: There are minimally displaced left posterolateral first through seventh rib fractures. The distal left clavicle fracture seen on chest x-ray is not included on this exam due to slice selection. Reconstructed images demonstrate no additional findings. IMPRESSION: 1. Trace left-sided pneumothorax, with punctate foci of gas seen in the pleural space adjacent to multiple left-sided rib fractures. Volume estimated far less than 5%. 2. Trace left pleural effusion. 3. Minimally displaced left posterior first through seventh rib fractures. 4. Minimal left upper lobe contusion. 5.  Aortic Atherosclerosis (ICD10-I70.0). Electronically Signed   By: Randa Ngo M.D.   On: 07/14/2019 21:02   CT CERVICAL SPINE WO CONTRAST  Result Date: 07/14/2019 CLINICAL DATA:  Status post fall down stairs today. Initial encounter. EXAM: CT CERVICAL SPINE WITHOUT CONTRAST TECHNIQUE: Multidetector CT imaging of the cervical spine was performed without intravenous contrast. Multiplanar CT image reconstructions were also generated. COMPARISON:  None. FINDINGS: Alignment: Normal. Skull base and vertebrae: No acute fracture. No primary  bone lesion or focal pathologic process. Soft tissues and spinal canal: No prevertebral fluid or swelling. No visible canal hematoma. Disc levels: Scattered facet degenerative change is worst on the left at C2-3 and C3-4. Mild multilevel disc bulging is also seen. Upper chest: Lung apices clear. Other: None. IMPRESSION: No acute abnormality. Mild degenerative disease. Electronically Signed   By:  Inge Rise M.D.   On: 07/14/2019 20:57   DG Pelvis Portable  Result Date: 07/14/2019 CLINICAL DATA:  [place] down stairs. EXAM: PORTABLE PELVIS 1-2 VIEWS COMPARISON:  None. FINDINGS: There is no evidence of pelvic fracture or diastasis. No pelvic bone lesions are seen. IMPRESSION: Negative. Electronically Signed   By: Nelson Chimes M.D.   On: 07/14/2019 20:29   DG Hand 2 View Left  Result Date: 07/15/2019 CLINICAL DATA:  Thumb dislocation, postreduction. EXAM: LEFT HAND - 2 VIEW COMPARISON:  Radiograph earlier this day. FINDINGS: Improved alignment of the thumb interphalangeal joint post reduction. Small fracture fragment about the proximal phalanx is obscured on the current exam. Overlying splint material in place limiting osseous and soft tissue fine detail. IMPRESSION: Improved alignment of the thumb interphalangeal joint post reduction. Small fracture fragment about the proximal phalanx is obscured on the current exam. Electronically Signed   By: Keith Rake M.D.   On: 07/15/2019 00:00   CT L-SPINE NO CHARGE  Result Date: 07/15/2019 CLINICAL DATA:  Fall down 14 stairs EXAM: CT LUMBAR SPINE WITHOUT CONTRAST TECHNIQUE: Multiplanar reconstructions of the lumbar spine were generated from contemporary CT of the abdomen and pelvis without contrast. 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 Alignment: Slight levocurvature of the lumbar spine with an apex at the L2 level. Vertebral bodies and posterior elements are normally aligned without spondylolysis or spondylolisthesis. Vertebrae: No acute fracture or focal pathologic process. Multilevel discogenic endplate changes are present. Additional facet degenerative changes throughout the lumbar spine as well. Paraspinal and other soft tissues: Contusive changes of the left flank and superficial to the  paraspinal musculature, better detailed on dedicated CT of the abdomen and pelvis from which this study is reconstructed. Disc levels: Level by level evaluation of the lumbar spine below: T12-L1: Mild disc height loss without significant posterior disc abnormality. Mild bilateral facet arthropathy. No significant spinal canal or foraminal stenosis. L1-L2: Mild disc height loss without significant posterior disc abnormality. Moderate bilateral facet arthropathy. No significant canal stenosis. Mild bilateral foraminal narrowing. L2-L3: Moderate disc height loss with partially calcified global disc bulge. Moderate bilateral facet arthropathy. Mild canal stenosis and bilateral foraminal narrowing as well as partial effacement of the lateral recesses. L3-L4: Moderate disc height loss, vacuum disc, partially calcified global disc bulge slightly eccentric to the right subarticular and foraminal zones. Some mild ligamentum flavum redundancy and moderate bilateral facet arthropathy. Mild to moderate canal stenosis and moderate bilateral foraminal narrowing as well as effacement of the lateral recesses. L4-L5: Mild disc height loss with minimal global disc bulge. Mild bilateral facet arthropathy. No significant canal stenosis. At most mild bilateral foraminal narrowing. L5-S1: Moderate disc height loss and shallow global disc bulge with moderate bilateral facet arthropathy. No significant canal stenosis or foraminal narrowing. IMPRESSION: 1. No acute fracture or traumatic malalignment of the lumbar spine. 2. Contusive changes of the left flank and superficial to the paraspinal musculature, better detailed on dedicated CT of the abdomen and pelvis from which this study is  reconstructed. 3. Multilevel discogenic and facet degenerative changes, as detailed above. Features maximal at L3-4 with mild-to-moderate canal stenosis, moderate bilateral foraminal narrowing and effacement of the lateral recesses. These results were called  by telephone at the time of interpretation on 07/15/2019 at 12:19 am to provider Donne Hazel, who verbally acknowledged these results. Electronically Signed   By: Lovena Le M.D.   On: 07/15/2019 00:29   DG Chest Portable 1 View  Addendum Date: 07/14/2019   ADDENDUM REPORT: 07/14/2019 20:32 ADDENDUM: There is a fracture of the distal left clavicle. Electronically Signed   By: Nelson Chimes M.D.   On: 07/14/2019 20:32   Result Date: 07/14/2019 CLINICAL DATA:  [place] down stairs. Pain in the left shoulder and hips. EXAM: PORTABLE CHEST 1 VIEW COMPARISON:  04/24/2017 FINDINGS: Enlarged cardiac silhouette. Mediastinal shadows are normal. Question atelectasis or infiltrate at the left lung base. No pneumothorax or hemothorax. There appear to be fractures of the left second third and fourth ribs, age indeterminate. IMPRESSION: Cardiomegaly.  Question left base infiltrate or atelectasis. Abnormal appearance of the left posterolateral second, third and fourth ribs, age indeterminate. Consider chest CT or rib detail films. Electronically Signed: By: Nelson Chimes M.D. On: 07/14/2019 20:29   DG Shoulder Left Portable  Result Date: 07/14/2019 CLINICAL DATA:  [place] down stairs. EXAM: LEFT SHOULDER COMPARISON:  None. FINDINGS: Humeral head is normally located. No fracture of the humerus, or scapula. There is a fracture of the distal clavicle. There appear to be fractures of the left second, third, fourth and possibly fifth ribs. I favor these are acute. IMPRESSION: Probably acute fractures of the left second through fifth ribs posteriorly/laterally. Distal clavicular fracture. Electronically Signed   By: Nelson Chimes M.D.   On: 07/14/2019 20:31   DG Finger Thumb Left  Result Date: 07/14/2019 CLINICAL DATA:  Pain EXAM: LEFT THUMB 2+V COMPARISON:  None. FINDINGS: There is dislocation of the interphalangeal joint of the first digit. There is surrounding soft tissue swelling. There are advanced degenerative changes of the first  carpometacarpal joint. There is an osseous fragment adjacent to the metacarpophalangeal joint which may represent a small avulsion fracture. IMPRESSION: 1. Dislocation of the interphalangeal joint of the first digit. 2. Possible avulsion fracture at the metacarpophalangeal joint. 3. Advanced degenerative changes of the first carpometacarpal joint. Electronically Signed   By: Constance Holster M.D.   On: 07/14/2019 21:32    Anti-infectives: Anti-infectives (From admission, onward)   None       Assessment/Plan Fall Distal clavicle fx - ortho consult pending, sling for now Left thumb fx dislocation-ortho hand consult pending.  fx relocated and currently in a splint. TBI/SAH- NSGY consult, no plans for repeat head scan unless he changes neurologically.  Some repetitiveness.  TBI therapies ordered Left posterior 1-7 rib fx with trace PTX - repeat CXR in am.  No SOB today and sats good.  Pain control.  PT/OT for mobilization ABL anemia secondary to paraspinal muscle hematoma and abdominal wall contusion - recheck CBC in am.  Hold thinners for now.  hgb down from 11.4-->9.4 ? Perinephric contusion - UA negative for blood.  Likely from above ARI - questions some chronic component since presenting Cr 1.97 and wife states his cr is generally mildly abnormal with all of this labs.  Cr down to 1.85 this am.  Check in am.  Continue gentle hydration Neck Pain - ct cervial negative, but with pain.  Flex-ex pending HTN - resume home meds FEN - NPO for  now til ortho sees VTE - hold for now due to [facility] and hematoma ID - none currently needed   LOS: 1 day    Henreitta Cea , [facility] 07/15/2019, 8:07 AM Please see Amion for pager number during day hours 7:00am-4:30pm or 7:00am -11:30am on weekends

## 2019-07-15 NOTE — ED Notes (Signed)
Attempted report 

## 2019-07-15 NOTE — Plan of Care (Signed)
  Problem: Nutrition: Goal: Adequate nutrition will be maintained Outcome: Progressing Patient tolerates snacks without complaint of nausea.  Problem: Pain Managment: Goal: General experience of comfort will improve Outcome: Progressing    Patient states pain is better controlled with medications available PRN.

## 2019-07-15 NOTE — Consult Note (Signed)
Reason for Consult:subdural blood Referring Physician: Trauma MD  [name] is an 86 y.o. male.  HPI: whom fell down 12 stairs amnestic for the fall. Only remembers waking up to EMS arriving. Was not coherent immediately after fall. Brought to the [facility] ED, head CT showed miniscule SAH, multiple rib fxs on the left. gcs 15.  Past Medical History:  Diagnosis Date  . BPH (benign prostatic hyperplasia)   . Coronary artery disease   . High cholesterol   . Hypertension   . MI (myocardial infarction) ([place])   . Prediabetes     Past Surgical History:  Procedure Laterality Date  . APPENDECTOMY    . CARDIAC CATHETERIZATION    . EXPLORATORY LAPAROTOMY      No family history on file.  Social History:  reports that he has never smoked. He has never used smokeless tobacco. He reports that he does not drink alcohol or use drugs.  Allergies: No Known Allergies  Medications: I have reviewed the patient's current medications.  Results for orders placed or performed during the hospital encounter of 07/14/19 (from the past 48 hour(s))  Sample to Blood Bank     Status: None   Collection Time: 07/14/19  8:20 PM  Result Value Ref Range   Blood Bank Specimen SAMPLE AVAILABLE FOR TESTING    Sample Expiration      07/15/2019,2359 Performed at [facility], [address], [place], [place] [address]   Comprehensive metabolic panel     Status: Abnormal   Collection Time: 07/14/19  8:22 PM  Result Value Ref Range   Sodium 139 135 - 145 mmol/L   Potassium 4.5 3.5 - 5.1 mmol/L   Chloride 105 98 - 111 mmol/L   CO2 23 22 - 32 mmol/L   Glucose, Bld 161 (H) 70 - 99 mg/dL    Comment: Glucose reference range applies only to samples taken after fasting for at least 8 hours.   BUN 33 (H) 8 - 23 mg/dL   Creatinine, Ser 1.97 (H) 0.61 - 1.24 mg/dL   Calcium 9.0 8.9 - 10.3 mg/dL   Total Protein 6.2 (L) 6.5 - 8.1 g/dL   Albumin 3.5 3.5 - 5.0 g/dL   AST 39 15 - 41 U/L   ALT 27 0 - 44 U/L    Alkaline Phosphatase 71 38 - 126 U/L   Total Bilirubin 0.7 0.3 - 1.2 mg/dL   GFR calc non Af Amer 31 (L) >60 mL/min   GFR calc Af Amer 35 (L) >60 mL/min   Anion gap 11 5 - 15    Comment: Performed at [facility] [address], [place], [place] [address]  CBC     Status: Abnormal   Collection Time: 07/14/19  8:22 PM  Result Value Ref Range   WBC 6.7 4.0 - 10.5 K/uL   RBC 3.54 (L) 4.22 - 5.81 MIL/uL   Hemoglobin 11.4 (L) 13.0 - 17.0 g/dL   HCT 35.1 (L) 39.0 - 52.0 %   MCV 99.2 80.0 - 100.0 fL   MCH 32.2 26.0 - 34.0 pg   MCHC 32.5 30.0 - 36.0 g/dL   RDW 12.7 11.5 - 15.5 %   Platelets 194 150 - 400 K/uL   nRBC 0.0 0.0 - 0.2 %    Comment: Performed at [facility], [place] [address] [place], [place] [address]  Ethanol     Status: None   Collection Time: 07/14/19  8:22 PM  Result  Value Ref Range   Alcohol, Ethyl (B) <10 <10 mg/dL    Comment: (NOTE) Lowest detectable limit for serum alcohol is 10 mg/dL. For medical purposes only. Performed at [facility], [place] [address], [place], [place] [address]   Lactic acid, plasma     Status: None   Collection Time: 07/14/19  8:22 PM  Result Value Ref Range   Lactic Acid, Venous 1.0 0.5 - 1.9 mmol/L    Comment: Performed at [facility] [address], [place], [place] [address]  Protime-INR     Status: None   Collection Time: 07/14/19  8:22 PM  Result Value Ref Range   Prothrombin Time 12.8 11.4 - 15.2 seconds   INR 1.0 0.8 - 1.2    Comment: (NOTE) INR goal varies based on device and disease states. Performed at [facility], [place] [address] [place], [place] [address]   I-Stat Chem 8, ED     Status: Abnormal   Collection Time: 07/14/19  8:33 PM  Result Value Ref Range   Sodium 139 135 - 145 mmol/L   Potassium 4.3 3.5 - 5.1 mmol/L   Chloride 103 98 - 111 mmol/L   BUN 36 (H) 8 - 23 mg/dL   Creatinine, Ser 2.10 (H) 0.61 - 1.24 mg/dL   Glucose, Bld 154 (H) 70 - 99 mg/dL    Comment: Glucose reference  range applies only to samples taken after fasting for at least 8 hours.   Calcium, Ion 1.06 (L) 1.15 - 1.40 mmol/L   TCO2 25 22 - 32 mmol/L   Hemoglobin 10.9 (L) 13.0 - 17.0 g/dL   HCT 32.0 (L) 39.0 - 52.0 %  SARS Coronavirus 2 by RT PCR (hospital order, performed in [facility] hospital lab) Nasopharyngeal Nasopharyngeal Swab     Status: None   Collection Time: 07/14/19  9:34 PM   Specimen: Nasopharyngeal Swab  Result Value Ref Range   SARS Coronavirus 2 NEGATIVE NEGATIVE    Comment: (NOTE) SARS-CoV-2 target nucleic acids are NOT DETECTED. The SARS-CoV-2 RNA is generally detectable in upper and lower respiratory specimens during the acute phase of infection. The lowest concentration of SARS-CoV-2 viral copies this assay can detect is 250 copies / mL. A negative result does not preclude SARS-CoV-2 infection and should not be used as the sole basis for treatment or other patient management decisions.  A negative result may occur with improper specimen collection / handling, submission of specimen other than nasopharyngeal swab, presence of viral mutation(s) within the areas targeted by this assay, and inadequate number of viral copies (<250 copies / mL). A negative result must be combined with clinical observations, patient history, and epidemiological information. Fact Sheet for Patients:   StrictlyIdeas.no Fact Sheet for Healthcare Providers: BankingDealers.co.za This test is not yet approved or cleared  by the [place] FDA and has been authorized for detection and/or diagnosis of SARS-CoV-2 by FDA under an Emergency Use Authorization (EUA).  This EUA will remain in effect (meaning this test can be used) for the duration of the COVID-19 declaration under Section 564(b)(1) of the Act, 21 U.S.C. section 360bbb-3(b)(1), unless the authorization is terminated or revoked sooner. Performed at [facility], [place] [address],  [place], [place] [address]     CT HEAD WO CONTRAST  Result Date: 07/14/2019 CLINICAL DATA:  Status post fall down stairs with a blow to the head. Headache. Initial encounter. EXAM: CT HEAD WITHOUT CONTRAST TECHNIQUE: Contiguous axial images were  obtained from the base of the skull through the vertex without intravenous contrast. COMPARISON:  Head CT scan 08/31/2007. FINDINGS: Brain: A small amount of subarachnoid hemorrhage is seen on the left and best visualized along the left insula and high left parietal lobe. No subdural hemorrhage, mass, hydrocephalus, pneumocephalus or midline shift. No intraventricular blood is identified. Vascular: No hyperdense vessel or unexpected calcification. Skull: Intact.  No focal lesion. Sinuses/Orbits: Mucosal thickening in the maxillary sinuses is worse on the right. Ethmoid air cell disease also noted. Other: Scalp contusion over the left parietal bone is noted. IMPRESSION: Small volume of subarachnoid hemorrhage on the left. Scalp contusion on the left without underlying fracture is also seen. Sinus disease. Critical Value/emergent results were called by telephone at the time of interpretation on 07/14/2019 at 8:48 pm to provider Carmin Muskrat , who verbally acknowledged these results. Electronically Signed   By: Inge Rise M.D.   On: 07/14/2019 20:52   CT Chest Wo Contrast  Result Date: 07/14/2019 CLINICAL DATA:  [place] down stairs, back pain EXAM: CT CHEST WITHOUT CONTRAST TECHNIQUE: Multidetector CT imaging of the chest was performed following the standard protocol without IV contrast. COMPARISON:  07/14/2019, 07/29/2006 FINDINGS: Cardiovascular: Unenhanced imaging of the heart and great vessels demonstrates trace pericardial fluid. The heart is not enlarged. There is extensive atherosclerosis of the aorta and coronary vessels. Evaluation of the vascular lumen is limited without IV contrast. Mediastinum/Nodes: No enlarged mediastinal or axillary lymph nodes. Thyroid  gland, trachea, and esophagus demonstrate no significant findings. Lungs/Pleura: There is a trace left-sided pneumothorax adjacent to displaced left first through seventh rib fractures, with punctate foci of gas seen within the left pleural space. Volume estimated far less than 5%. There is a trace left pleural effusion. Patchy consolidation within the periphery of the left upper lobe consistent with contusion. Upper Abdomen: No acute abnormality. Musculoskeletal: There are minimally displaced left posterolateral first through seventh rib fractures. The distal left clavicle fracture seen on chest x-ray is not included on this exam due to slice selection. Reconstructed images demonstrate no additional findings. IMPRESSION: 1. Trace left-sided pneumothorax, with punctate foci of gas seen in the pleural space adjacent to multiple left-sided rib fractures. Volume estimated far less than 5%. 2. Trace left pleural effusion. 3. Minimally displaced left posterior first through seventh rib fractures. 4. Minimal left upper lobe contusion. 5.  Aortic Atherosclerosis (ICD10-I70.0). Electronically Signed   By: Randa Ngo M.D.   On: 07/14/2019 21:02   CT CERVICAL SPINE WO CONTRAST  Result Date: 07/14/2019 CLINICAL DATA:  Status post fall down stairs today. Initial encounter. EXAM: CT CERVICAL SPINE WITHOUT CONTRAST TECHNIQUE: Multidetector CT imaging of the cervical spine was performed without intravenous contrast. Multiplanar CT image reconstructions were also generated. COMPARISON:  None. FINDINGS: Alignment: Normal. Skull base and vertebrae: No acute fracture. No primary bone lesion or focal pathologic process. Soft tissues and spinal canal: No prevertebral fluid or swelling. No visible canal hematoma. Disc levels: Scattered facet degenerative change is worst on the left at C2-3 and C3-4. Mild multilevel disc bulging is also seen. Upper chest: Lung apices clear. Other: None. IMPRESSION: No acute abnormality. Mild  degenerative disease. Electronically Signed   By: Inge Rise M.D.   On: 07/14/2019 20:57   DG Pelvis Portable  Result Date: 07/14/2019 CLINICAL DATA:  [place] down stairs. EXAM: PORTABLE PELVIS 1-2 VIEWS COMPARISON:  None. FINDINGS: There is no evidence of pelvic fracture or diastasis. No pelvic bone lesions are seen. IMPRESSION: Negative. Electronically  Signed   By: Nelson Chimes M.D.   On: 07/14/2019 20:29   DG Hand 2 View Left  Result Date: 07/15/2019 CLINICAL DATA:  Thumb dislocation, postreduction. EXAM: LEFT HAND - 2 VIEW COMPARISON:  Radiograph earlier this day. FINDINGS: Improved alignment of the thumb interphalangeal joint post reduction. Small fracture fragment about the proximal phalanx is obscured on the current exam. Overlying splint material in place limiting osseous and soft tissue fine detail. IMPRESSION: Improved alignment of the thumb interphalangeal joint post reduction. Small fracture fragment about the proximal phalanx is obscured on the current exam. Electronically Signed   By: Keith Rake M.D.   On: 07/15/2019 00:00   DG Chest Portable 1 View  Addendum Date: 07/14/2019   ADDENDUM REPORT: 07/14/2019 20:32 ADDENDUM: There is a fracture of the distal left clavicle. Electronically Signed   By: Nelson Chimes M.D.   On: 07/14/2019 20:32   Result Date: 07/14/2019 CLINICAL DATA:  [place] down stairs. Pain in the left shoulder and hips. EXAM: PORTABLE CHEST 1 VIEW COMPARISON:  04/24/2017 FINDINGS: Enlarged cardiac silhouette. Mediastinal shadows are normal. Question atelectasis or infiltrate at the left lung base. No pneumothorax or hemothorax. There appear to be fractures of the left second third and fourth ribs, age indeterminate. IMPRESSION: Cardiomegaly.  Question left base infiltrate or atelectasis. Abnormal appearance of the left posterolateral second, third and fourth ribs, age indeterminate. Consider chest CT or rib detail films. Electronically Signed: By: Nelson Chimes M.D. On:  07/14/2019 20:29   DG Shoulder Left Portable  Result Date: 07/14/2019 CLINICAL DATA:  [place] down stairs. EXAM: LEFT SHOULDER COMPARISON:  None. FINDINGS: Humeral head is normally located. No fracture of the humerus, or scapula. There is a fracture of the distal clavicle. There appear to be fractures of the left second, third, fourth and possibly fifth ribs. I favor these are acute. IMPRESSION: Probably acute fractures of the left second through fifth ribs posteriorly/laterally. Distal clavicular fracture. Electronically Signed   By: Nelson Chimes M.D.   On: 07/14/2019 20:31   DG Finger Thumb Left  Result Date: 07/14/2019 CLINICAL DATA:  Pain EXAM: LEFT THUMB 2+V COMPARISON:  None. FINDINGS: There is dislocation of the interphalangeal joint of the first digit. There is surrounding soft tissue swelling. There are advanced degenerative changes of the first carpometacarpal joint. There is an osseous fragment adjacent to the metacarpophalangeal joint which may represent a small avulsion fracture. IMPRESSION: 1. Dislocation of the interphalangeal joint of the first digit. 2. Possible avulsion fracture at the metacarpophalangeal joint. 3. Advanced degenerative changes of the first carpometacarpal joint. Electronically Signed   By: Constance Holster M.D.   On: 07/14/2019 21:32    Review of Systems  Constitutional: Negative.   HENT: Negative.   Eyes: Negative.   Respiratory: Negative.   Cardiovascular: Negative.   Gastrointestinal: Negative.   Endocrine: Negative.   Genitourinary: Negative.   Musculoskeletal:       Chest wall pain  Allergic/Immunologic: Negative.   Neurological: Negative.   Hematological: Negative.   Psychiatric/Behavioral: Negative.    Blood pressure (!) 113/51, pulse (!) 57, temperature (!) 97.5 F (36.4 C), resp. rate (!) 22, height 5\' 7"  (1.702 m), weight 99.8 kg, SpO2 94 %. Physical Exam  Constitutional: He is oriented to person, place, and time. He appears well-developed and  well-nourished.  Left forearm ace wrapped  HENT:  Head: Normocephalic.  Right Ear: External ear normal.  Left Ear: External ear normal.  Mouth/Throat: Oropharynx is clear and moist.  Eyes: Pupils are equal, round, and reactive to light. Conjunctivae and EOM are normal.  Neck:  In cervical collar  Cardiovascular: Normal rate, regular rhythm and normal heart sounds.  Respiratory:  Tender on left, multiple  Rib fractures  GI: Soft. Bowel sounds are normal.  Neurological: He is alert and oriented to person, place, and time. He has normal reflexes. He displays normal reflexes. A cranial nerve deficit is present. No sensory deficit. He exhibits normal muscle tone. Coordination normal. GCS eye subscore is 4. GCS verbal subscore is 5. GCS motor subscore is 6. He displays no Babinski's sign on the right side. He displays no Babinski's sign on the left side.  Perrl, full eom Tongue and uvula midline Moving bilateral lower extremities well gAit not assessed Proprioception is normal in upper and lower extremities Light touch intact.   Skin: Skin is warm and dry.  Psychiatric: He has a normal mood and affect. His behavior is normal. Judgment and thought content normal.    Assessment/Plan: [name] is a 86 y.o. male With a tiny amount of possible blood in the subarachnoid space intracranially. No mass effect. No ventricular effacement. Basal cisterns widely patent. No operative lesions. No need to repeat head CT unless neurological exam were to change. No need for cervical collar, alignment is normal, no prevertebral swelling.   Ashok Pall 07/15/2019, 12:07 AM

## 2019-07-15 NOTE — Consult Note (Signed)
F/U visit to pt still in [facility] after fall down stairs. Offered spiritual support and prayer to pt in pain, family, and staff at bedside. Pt reciprocated, warmly praying for me. Let them know they may call for a chaplain at any time.  Rev. Eloise Levels Chaplain

## 2019-07-15 NOTE — Progress Notes (Signed)
Orthopedic Tech Progress Note Patient Details:  [name] 01/21/1934 [id number] Replaced thump spica splint with removable splint and left shoulder immobilizer bedside or patient.  Ortho Devices Type of Ortho Device: Thumb velcro splint Splint Material: Fiberglass Ortho Device/Splint Location: ULE Ortho Device/Splint Interventions: Ordered, Application   Post Interventions Patient Tolerated: Well Instructions Provided: Care of device   [name] [name] 07/15/2019, 11:22 AM

## 2019-07-15 NOTE — Consult Note (Signed)
Reason for Consult:Polytrauma Referring Physician: A Lovick  [name] is an 86 y.o. male.  HPI: [name] was going up the stairs and fell. He is amnestic to the event and can't given details. Family heard the fall and called 911. He was brought to the ED and workup showed a left clav and thumb fx in addition to other injuries and orthopedic surgery was consulted. He is RHD and does not use any assistive devices. He does have a history of falls however.  Past Medical History:  Diagnosis Date  . BPH (benign prostatic hyperplasia)   . Coronary artery disease   . High cholesterol   . Hypertension   . MI (myocardial infarction) ([place])   . Prediabetes     Past Surgical History:  Procedure Laterality Date  . APPENDECTOMY    . CARDIAC CATHETERIZATION    . EXPLORATORY LAPAROTOMY      No family history on file.  Social History:  reports that he has never smoked. He has never used smokeless tobacco. He reports that he does not drink alcohol or use drugs.  Allergies: No Known Allergies  Medications: I have reviewed the patient's current medications.  Results for orders placed or performed during the hospital encounter of 07/14/19 (from the past 48 hour(s))  Sample to Blood Bank     Status: None   Collection Time: 07/14/19  8:20 PM  Result Value Ref Range   Blood Bank Specimen SAMPLE AVAILABLE FOR TESTING    Sample Expiration      07/15/2019,2359 Performed at [facility], [address], [place], [place] [address]   Comprehensive metabolic panel     Status: Abnormal   Collection Time: 07/14/19  8:22 PM  Result Value Ref Range   Sodium 139 135 - 145 mmol/L   Potassium 4.5 3.5 - 5.1 mmol/L   Chloride 105 98 - 111 mmol/L   CO2 23 22 - 32 mmol/L   Glucose, Bld 161 (H) 70 - 99 mg/dL    Comment: Glucose reference range applies only to samples taken after fasting for at least 8 hours.   BUN 33 (H) 8 - 23 mg/dL   Creatinine, Ser 1.97 (H) 0.61 - 1.24 mg/dL   Calcium 9.0  8.9 - 10.3 mg/dL   Total Protein 6.2 (L) 6.5 - 8.1 g/dL   Albumin 3.5 3.5 - 5.0 g/dL   AST 39 15 - 41 U/L   ALT 27 0 - 44 U/L   Alkaline Phosphatase 71 38 - 126 U/L   Total Bilirubin 0.7 0.3 - 1.2 mg/dL   GFR calc non Af Amer 31 (L) >60 mL/min   GFR calc Af Amer 35 (L) >60 mL/min   Anion gap 11 5 - 15    Comment: Performed at [facility] [address] [place], [place] [address]  CBC     Status: Abnormal   Collection Time: 07/14/19  8:22 PM  Result Value Ref Range   WBC 6.7 4.0 - 10.5 K/uL   RBC 3.54 (L) 4.22 - 5.81 MIL/uL   Hemoglobin 11.4 (L) 13.0 - 17.0 g/dL   HCT 35.1 (L) 39.0 - 52.0 %   MCV 99.2 80.0 - 100.0 fL   MCH 32.2 26.0 - 34.0 pg   MCHC 32.5 30.0 - 36.0 g/dL   RDW 12.7 11.5 - 15.5 %   Platelets 194 150 - 400 K/uL   nRBC 0.0 0.0 - 0.2 %    Comment: Performed at [facility], [place] [address] [place], [place] [address]  Ethanol     Status: None   Collection Time: 07/14/19  8:22 PM  Result Value Ref Range   Alcohol, Ethyl (B) <10 <10 mg/dL    Comment: (NOTE) Lowest detectable limit for serum alcohol is 10 mg/dL. For medical purposes only. Performed at [facility], [place] [address] [place], [place] [address]   Lactic acid, plasma     Status: None   Collection Time: 07/14/19  8:22 PM  Result Value Ref Range   Lactic Acid, Venous 1.0 0.5 - 1.9 mmol/L    Comment: Performed at [facility] [address], [place], [place] [address]  Protime-INR     Status: None   Collection Time: 07/14/19  8:22 PM  Result Value Ref Range   Prothrombin Time 12.8 11.4 - 15.2 seconds   INR 1.0 0.8 - 1.2    Comment: (NOTE) INR goal varies based on device and disease states. Performed at [facility], [place] [address], [place], [place] [address]   I-Stat Chem 8, ED     Status: Abnormal   Collection Time: 07/14/19  8:33 PM  Result Value Ref Range   Sodium 139 135 - 145 mmol/L   Potassium 4.3 3.5 - 5.1 mmol/L   Chloride 103 98 - 111 mmol/L   BUN 36  (H) 8 - 23 mg/dL   Creatinine, Ser 2.10 (H) 0.61 - 1.24 mg/dL   Glucose, Bld 154 (H) 70 - 99 mg/dL    Comment: Glucose reference range applies only to samples taken after fasting for at least 8 hours.   Calcium, Ion 1.06 (L) 1.15 - 1.40 mmol/L   TCO2 25 22 - 32 mmol/L   Hemoglobin 10.9 (L) 13.0 - 17.0 g/dL   HCT 32.0 (L) 39.0 - 52.0 %  SARS Coronavirus 2 by RT PCR (hospital order, performed in [facility] hospital lab) Nasopharyngeal Nasopharyngeal Swab     Status: None   Collection Time: 07/14/19  9:34 PM   Specimen: Nasopharyngeal Swab  Result Value Ref Range   SARS Coronavirus 2 NEGATIVE NEGATIVE    Comment: (NOTE) SARS-CoV-2 target nucleic acids are NOT DETECTED. The SARS-CoV-2 RNA is generally detectable in upper and lower respiratory specimens during the acute phase of infection. The lowest concentration of SARS-CoV-2 viral copies this assay can detect is 250 copies / mL. A negative result does not preclude SARS-CoV-2 infection and should not be used as the sole basis for treatment or other patient management decisions.  A negative result may occur with improper specimen collection / handling, submission of specimen other than nasopharyngeal swab, presence of viral mutation(s) within the areas targeted by this assay, and inadequate number of viral copies (<250 copies / mL). A negative result must be combined with clinical observations, patient history, and epidemiological information. Fact Sheet for Patients:   StrictlyIdeas.no Fact Sheet for Healthcare Providers: BankingDealers.co.za This test is not yet approved or cleared  by the [place] FDA and has been authorized for detection and/or diagnosis of SARS-CoV-2 by FDA under an Emergency Use Authorization (EUA).  This EUA will remain in effect (meaning this test can be used) for the duration of the COVID-19 declaration under Section 564(b)(1) of the Act, 21 U.S.C. section  360bbb-3(b)(1), unless the authorization is terminated or revoked sooner. Performed at [facility], [place] [address], [place], [place] [address]   Urinalysis, Routine w reflex microscopic     Status:  None   Collection Time: 07/14/19 11:56 PM  Result Value Ref Range   Color, Urine YELLOW YELLOW   APPearance CLEAR CLEAR   Specific Gravity, Urine 1.018 1.005 - 1.030   pH 5.0 5.0 - 8.0   Glucose, UA NEGATIVE NEGATIVE mg/dL   Hgb urine dipstick NEGATIVE NEGATIVE   Bilirubin Urine NEGATIVE NEGATIVE   Ketones, ur NEGATIVE NEGATIVE mg/dL   Protein, ur NEGATIVE NEGATIVE mg/dL   Nitrite NEGATIVE NEGATIVE   Leukocytes,Ua NEGATIVE NEGATIVE    Comment: Performed at [facility] [address] [place], [place] [address]  CBC     Status: Abnormal   Collection Time: 07/15/19  6:17 AM  Result Value Ref Range   WBC 7.2 4.0 - 10.5 K/uL   RBC 2.93 (L) 4.22 - 5.81 MIL/uL   Hemoglobin 9.4 (L) 13.0 - 17.0 g/dL   HCT 29.0 (L) 39.0 - 52.0 %   MCV 99.0 80.0 - 100.0 fL   MCH 32.1 26.0 - 34.0 pg   MCHC 32.4 30.0 - 36.0 g/dL   RDW 12.6 11.5 - 15.5 %   Platelets 158 150 - 400 K/uL   nRBC 0.0 0.0 - 0.2 %    Comment: Performed at [facility], [place] [address], [place], [place] [address]  Basic metabolic panel     Status: Abnormal   Collection Time: 07/15/19  6:17 AM  Result Value Ref Range   Sodium 137 135 - 145 mmol/L   Potassium 4.6 3.5 - 5.1 mmol/L   Chloride 107 98 - 111 mmol/L   CO2 22 22 - 32 mmol/L   Glucose, Bld 197 (H) 70 - 99 mg/dL    Comment: Glucose reference range applies only to samples taken after fasting for at least 8 hours.   BUN 36 (H) 8 - 23 mg/dL   Creatinine, Ser 1.85 (H) 0.61 - 1.24 mg/dL   Calcium 8.5 (L) 8.9 - 10.3 mg/dL   GFR calc non Af Amer 33 (L) >60 mL/min   GFR calc Af Amer 38 (L) >60 mL/min   Anion gap 8 5 - 15    Comment: Performed at [facility] [address], [place], [place] [address]    CT Abdomen Pelvis Wo Contrast  Addendum Date:  07/15/2019   ADDENDUM REPORT: 07/15/2019 00:19 ADDENDUM: Additional comparison: Same day CT Chest 3.1 cm infrarenal abdominal aortic aneurysm. Recommend followup by ultrasound in 3 years. This recommendation follows ACR consensus guidelines: White Paper of the ACR Incidental Findings Committee II on Vascular Findings. J Am Coll Radiol 2013; 10:789-794. Aortic aneurysm NOS (ICD10-I71.9) These results and addendum were called by telephone at the time of interpretation on 07/15/2019 at 12:18 am to provider Donne Hazel, who verbally acknowledged these results. Electronically Signed   By: Lovena Le M.D.   On: 07/15/2019 00:19   Result Date: 07/15/2019 CLINICAL DATA:  Fall down 14 stairs, abdominal bruising EXAM: CT ABDOMEN AND PELVIS WITHOUT CONTRAST TECHNIQUE: Multidetector CT imaging of the abdomen and pelvis was performed following the standard protocol without IV contrast. COMPARISON:  CT 10/15/2012, contemporary lumbar reconstructions. FINDINGS: Lower chest: Contusion and possible pulmonary laceration within the lingula (1/4) is seen adjacent lateral left sixth, seventh and eighth rib fractures. Small amount of pleural fluid, likely hemorrhage. Small anterior pneumothorax (4/18). Additional contusive changes along the left chest wall. Mild cardiomegaly. Extensive three-vessel coronary artery calcifications. Small pericardial effusion. Hepatobiliary: No visible direct hepatic injury. No perihepatic hematoma. No visible lesions on this noncontrast  exam. Normal a Paddock attenuation. Smooth surface contour. Normal gallbladder and biliary tree. Pancreas: No contusive pancreatic changes or evidence of ductal disruption. No peripancreatic inflammation or visible pancreatic lesion on noncontrast study. Spleen: No direct splenic injury or perisplenic hemorrhage. Adrenals/Urinary Tract: No adrenal hemorrhage or suspicious adrenal lesions. There is mild bilateral perinephric stranding but with more focal stranding seen  adjacent the posterior left kidney within the perirenal fat. Evaluation for a direct injury is limited in the absence of contrast media. No visible or contour deforming renal lesion. No urolithiasis or hydronephrosis. Indentation of the bladder base by an enlarged prostate with median lobe hypertrophy. Stomach/Bowel: Distal esophagus, stomach and duodenal sweep are unremarkable. No small bowel wall thickening or dilatation. No evidence of obstruction. Appendix is not visualized. No focal inflammation the vicinity of the cecum to suggest an occult appendicitis. No colonic dilatation or wall thickening. Scattered colonic diverticula without focal pericolonic inflammation to suggest diverticulitis. Vascular/Lymphatic: Atherosclerotic calcifications throughout the abdominal aorta and branch vessels. Focal abdominal aortic ectasia measuring up to 3.1 cm in the infrarenal abdominal aorta (1/41, 6/78). No enlarged abdominopelvic lymph nodes. Reproductive: Prostatomegaly with few coarse prostate calcifications. Other: Extensive left flank contusive change and body wall hematoma superficial to the left paraspinal musculature and gluteal musculature. Musculoskeletal: Dedicated lumbar reconstructions generated and dictated separately. Please see report for further details of the lumbar spine. No acute fracture of the bony pelvis. Advanced degenerative changes in the bilateral hips. Mild degenerative changes at the SI joints and symphysis pubis. Enthesopathy throughout the pelvis. No suspicious osseous lesions. The osseous structures appear diffusely demineralized which may limit detection of small or nondisplaced fractures. Arthrosis of the left wrist incidentally included within the level of imaging. Some calcification of the triangular fibrocartilage noted as well possibly chondrocalcinosis/CPPD. IMPRESSION: 1. Extensive left flank contusive change and body wall hematoma superficial to the left paraspinal musculature and  gluteal musculature. Hematoma measures up to 13 mm in maximal thickness (1/50). 2. Contusion and possible pulmonary laceration within the lingula with adjacent left sixth, seventh and eighth rib fractures. Small amount of pleural fluid, likely hemorrhage. Small anterior left pneumothorax. 3. Mild bilateral perinephric stranding but with more focal stranding, possibly reflecting contusive change, seen adjacent the posterior left kidney within the perirenal fat. Evaluation for a direct injury is limited in the absence of contrast media. 4. Dedicated lumbar reconstructions were generated and dictated separately. Please see full report for further details. 5. No other acute traumatic injuries in the abdomen or pelvis. 6. Prostatomegaly with indentation of the bladder base. Correlate for symptoms of outlet obstruction. 7. Colonic diverticulosis without evidence of diverticulitis. 8. Advanced degenerative changes in the bilateral hips. 9. Aortic Atherosclerosis (ICD10-I70.0). Electronically Signed: By: Lovena Le M.D. On: 07/15/2019 00:12   CT HEAD WO CONTRAST  Result Date: 07/14/2019 CLINICAL DATA:  Status post fall down stairs with a blow to the head. Headache. Initial encounter. EXAM: CT HEAD WITHOUT CONTRAST TECHNIQUE: Contiguous axial images were obtained from the base of the skull through the vertex without intravenous contrast. COMPARISON:  Head CT scan 08/31/2007. FINDINGS: Brain: A small amount of subarachnoid hemorrhage is seen on the left and best visualized along the left insula and high left parietal lobe. No subdural hemorrhage, mass, hydrocephalus, pneumocephalus or midline shift. No intraventricular blood is identified. Vascular: No hyperdense vessel or unexpected calcification. Skull: Intact.  No focal lesion. Sinuses/Orbits: Mucosal thickening in the maxillary sinuses is worse on the right. Ethmoid air cell disease also noted.  Other: Scalp contusion over the left parietal bone is noted. IMPRESSION:  Small volume of subarachnoid hemorrhage on the left. Scalp contusion on the left without underlying fracture is also seen. Sinus disease. Critical Value/emergent results were called by telephone at the time of interpretation on 07/14/2019 at 8:48 pm to provider Carmin Muskrat , who verbally acknowledged these results. Electronically Signed   By: Inge Rise M.D.   On: 07/14/2019 20:52   CT Chest Wo Contrast  Result Date: 07/14/2019 CLINICAL DATA:  [place] down stairs, back pain EXAM: CT CHEST WITHOUT CONTRAST TECHNIQUE: Multidetector CT imaging of the chest was performed following the standard protocol without IV contrast. COMPARISON:  07/14/2019, 07/29/2006 FINDINGS: Cardiovascular: Unenhanced imaging of the heart and great vessels demonstrates trace pericardial fluid. The heart is not enlarged. There is extensive atherosclerosis of the aorta and coronary vessels. Evaluation of the vascular lumen is limited without IV contrast. Mediastinum/Nodes: No enlarged mediastinal or axillary lymph nodes. Thyroid gland, trachea, and esophagus demonstrate no significant findings. Lungs/Pleura: There is a trace left-sided pneumothorax adjacent to displaced left first through seventh rib fractures, with punctate foci of gas seen within the left pleural space. Volume estimated far less than 5%. There is a trace left pleural effusion. Patchy consolidation within the periphery of the left upper lobe consistent with contusion. Upper Abdomen: No acute abnormality. Musculoskeletal: There are minimally displaced left posterolateral first through seventh rib fractures. The distal left clavicle fracture seen on chest x-ray is not included on this exam due to slice selection. Reconstructed images demonstrate no additional findings. IMPRESSION: 1. Trace left-sided pneumothorax, with punctate foci of gas seen in the pleural space adjacent to multiple left-sided rib fractures. Volume estimated far less than 5%. 2. Trace left pleural  effusion. 3. Minimally displaced left posterior first through seventh rib fractures. 4. Minimal left upper lobe contusion. 5.  Aortic Atherosclerosis (ICD10-I70.0). Electronically Signed   By: Randa Ngo M.D.   On: 07/14/2019 21:02   CT CERVICAL SPINE WO CONTRAST  Result Date: 07/14/2019 CLINICAL DATA:  Status post fall down stairs today. Initial encounter. EXAM: CT CERVICAL SPINE WITHOUT CONTRAST TECHNIQUE: Multidetector CT imaging of the cervical spine was performed without intravenous contrast. Multiplanar CT image reconstructions were also generated. COMPARISON:  None. FINDINGS: Alignment: Normal. Skull base and vertebrae: No acute fracture. No primary bone lesion or focal pathologic process. Soft tissues and spinal canal: No prevertebral fluid or swelling. No visible canal hematoma. Disc levels: Scattered facet degenerative change is worst on the left at C2-3 and C3-4. Mild multilevel disc bulging is also seen. Upper chest: Lung apices clear. Other: None. IMPRESSION: No acute abnormality. Mild degenerative disease. Electronically Signed   By: Inge Rise M.D.   On: 07/14/2019 20:57   DG Pelvis Portable  Result Date: 07/14/2019 CLINICAL DATA:  [place] down stairs. EXAM: PORTABLE PELVIS 1-2 VIEWS COMPARISON:  None. FINDINGS: There is no evidence of pelvic fracture or diastasis. No pelvic bone lesions are seen. IMPRESSION: Negative. Electronically Signed   By: Nelson Chimes M.D.   On: 07/14/2019 20:29   DG Hand 2 View Left  Result Date: 07/15/2019 CLINICAL DATA:  Thumb dislocation, postreduction. EXAM: LEFT HAND - 2 VIEW COMPARISON:  Radiograph earlier this day. FINDINGS: Improved alignment of the thumb interphalangeal joint post reduction. Small fracture fragment about the proximal phalanx is obscured on the current exam. Overlying splint material in place limiting osseous and soft tissue fine detail. IMPRESSION: Improved alignment of the thumb interphalangeal joint post reduction. Small fracture  fragment about the proximal phalanx is obscured on the current exam. Electronically Signed   By: Keith Rake M.D.   On: 07/15/2019 00:00   CT L-SPINE NO CHARGE  Result Date: 07/15/2019 CLINICAL DATA:  Fall down 14 stairs EXAM: CT LUMBAR SPINE WITHOUT CONTRAST TECHNIQUE: Multiplanar reconstructions of the lumbar spine were generated from contemporary CT of the abdomen and pelvis without contrast. 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 Alignment: Slight levocurvature of the lumbar spine with an apex at the L2 level. Vertebral bodies and posterior elements are normally aligned without spondylolysis or spondylolisthesis. Vertebrae: No acute fracture or focal pathologic process. Multilevel discogenic endplate changes are present. Additional facet degenerative changes throughout the lumbar spine as well. Paraspinal and other soft tissues: Contusive changes of the left flank and superficial to the paraspinal musculature, better detailed on dedicated CT of the abdomen and pelvis from which this study is reconstructed. Disc levels: Level by level evaluation of the lumbar spine below: T12-L1: Mild disc height loss without significant posterior disc abnormality. Mild bilateral facet arthropathy. No significant spinal canal or foraminal stenosis. L1-L2: Mild disc height loss without significant posterior disc abnormality. Moderate bilateral facet arthropathy. No significant canal stenosis. Mild bilateral foraminal narrowing. L2-L3: Moderate disc height loss with partially calcified global disc bulge. Moderate bilateral facet arthropathy. Mild canal stenosis and bilateral foraminal narrowing as well as partial effacement of the lateral recesses. L3-L4: Moderate disc height loss, vacuum disc, partially calcified global disc bulge slightly eccentric to the right  subarticular and foraminal zones. Some mild ligamentum flavum redundancy and moderate bilateral facet arthropathy. Mild to moderate canal stenosis and moderate bilateral foraminal narrowing as well as effacement of the lateral recesses. L4-L5: Mild disc height loss with minimal global disc bulge. Mild bilateral facet arthropathy. No significant canal stenosis. At most mild bilateral foraminal narrowing. L5-S1: Moderate disc height loss and shallow global disc bulge with moderate bilateral facet arthropathy. No significant canal stenosis or foraminal narrowing. IMPRESSION: 1. No acute fracture or traumatic malalignment of the lumbar spine. 2. Contusive changes of the left flank and superficial to the paraspinal musculature, better detailed on dedicated CT of the abdomen and pelvis from which this study is reconstructed. 3. Multilevel discogenic and facet degenerative changes, as detailed above. Features maximal at L3-4 with mild-to-moderate canal stenosis, moderate bilateral foraminal narrowing and effacement of the lateral recesses. These results were called by telephone at the time of interpretation on 07/15/2019 at 12:19 am to provider Donne Hazel, who verbally acknowledged these results. Electronically Signed   By: Lovena Le M.D.   On: 07/15/2019 00:29   DG Chest Portable 1 View  Addendum Date: 07/14/2019   ADDENDUM REPORT: 07/14/2019 20:32 ADDENDUM: There is a fracture of the distal left clavicle. Electronically Signed   By: Nelson Chimes M.D.   On: 07/14/2019 20:32   Result Date: 07/14/2019 CLINICAL DATA:  [place] down stairs. Pain in the left shoulder and hips. EXAM: PORTABLE CHEST 1 VIEW COMPARISON:  04/24/2017 FINDINGS: Enlarged cardiac silhouette. Mediastinal shadows are normal. Question atelectasis or infiltrate at the left lung base. No pneumothorax or hemothorax. There appear to be fractures of the left second third and fourth ribs, age indeterminate. IMPRESSION: Cardiomegaly.  Question left base  infiltrate or atelectasis. Abnormal appearance of the left posterolateral second, third and fourth ribs, age indeterminate. Consider chest CT or rib detail films. Electronically Signed:  By: Nelson Chimes M.D. On: 07/14/2019 20:29   DG Shoulder Left Portable  Result Date: 07/14/2019 CLINICAL DATA:  [place] down stairs. EXAM: LEFT SHOULDER COMPARISON:  None. FINDINGS: Humeral head is normally located. No fracture of the humerus, or scapula. There is a fracture of the distal clavicle. There appear to be fractures of the left second, third, fourth and possibly fifth ribs. I favor these are acute. IMPRESSION: Probably acute fractures of the left second through fifth ribs posteriorly/laterally. Distal clavicular fracture. Electronically Signed   By: Nelson Chimes M.D.   On: 07/14/2019 20:31   DG Finger Thumb Left  Result Date: 07/14/2019 CLINICAL DATA:  Pain EXAM: LEFT THUMB 2+V COMPARISON:  None. FINDINGS: There is dislocation of the interphalangeal joint of the first digit. There is surrounding soft tissue swelling. There are advanced degenerative changes of the first carpometacarpal joint. There is an osseous fragment adjacent to the metacarpophalangeal joint which may represent a small avulsion fracture. IMPRESSION: 1. Dislocation of the interphalangeal joint of the first digit. 2. Possible avulsion fracture at the metacarpophalangeal joint. 3. Advanced degenerative changes of the first carpometacarpal joint. Electronically Signed   By: Constance Holster M.D.   On: 07/14/2019 21:32    Review of Systems  HENT: Negative for ear discharge, ear pain, hearing loss and tinnitus.   Eyes: Negative for photophobia and pain.  Respiratory: Negative for cough and shortness of breath.   Cardiovascular: Positive for chest pain.  Gastrointestinal: Negative for abdominal pain, nausea and vomiting.  Genitourinary: Negative for dysuria, flank pain, frequency and urgency.  Musculoskeletal: Positive for arthralgias (Left  shoulder, left thumb). Negative for back pain, myalgias and neck pain.  Neurological: Negative for dizziness and headaches.  Hematological: Does not bruise/bleed easily.  Psychiatric/Behavioral: The patient is not nervous/anxious.    Blood pressure (!) 126/49, pulse 71, temperature (!) 97.5 F (36.4 C), resp. rate 19, height 5\' 7"  (1.702 m), weight 99.8 kg, SpO2 95 %. Physical Exam  Constitutional: He appears well-developed and well-nourished. No distress.  HENT:  Head: Normocephalic.  Eyes: Conjunctivae are normal. Right eye exhibits no discharge. Left eye exhibits no discharge. No scleral icterus.  Neck:  C-collar  Cardiovascular: Normal rate and regular rhythm.  Respiratory: Effort normal. No respiratory distress.  Musculoskeletal:     Comments: Left shoulder, elbow, wrist, digits- no skin wounds, shoulder mod TTP, thumb spica splint in place, no instability, no blocks to motion  Sens  Ax/R/M/U intact, distal thumb paresthetic  Mot   Ax/ R/ PIN/ M/ AIN/ U grossly intact  Neurological: He is alert.  Skin: Skin is warm and dry. He is not diaphoretic.  Psychiatric: He has a normal mood and affect. His behavior is normal.    Assessment/Plan: Left clav fx -- Short period of sling immobilization followed by early motion. Should heal well without surgery. Left thumb phalangeal fxs/dislocatioin -- Will change splint to removable though paresthesias may be 2/2 swelling. Should also do well without surgery. May f/u with Dr. Doreatha Martin for both injuries in 2 weeks. 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

## 2019-07-16 ENCOUNTER — Inpatient Hospital Stay (HOSPITAL_COMMUNITY): Payer: PPO

## 2019-07-16 ENCOUNTER — Encounter (HOSPITAL_COMMUNITY): Payer: Self-pay

## 2019-07-16 LAB — BASIC METABOLIC PANEL
Anion gap: 8 (ref 5–15)
BUN: 30 mg/dL — ABNORMAL HIGH (ref 8–23)
CO2: 23 mmol/L (ref 22–32)
Calcium: 8.3 mg/dL — ABNORMAL LOW (ref 8.9–10.3)
Chloride: 105 mmol/L (ref 98–111)
Creatinine, Ser: 1.81 mg/dL — ABNORMAL HIGH (ref 0.61–1.24)
GFR calc Af Amer: 39 mL/min — ABNORMAL LOW (ref >60)
GFR calc non Af Amer: 34 mL/min — ABNORMAL LOW (ref >60)
Glucose, Bld: 124 mg/dL — ABNORMAL HIGH (ref 70–99)
Potassium: 4.7 mmol/L (ref 3.5–5.1)
Sodium: 136 mmol/L (ref 135–145)

## 2019-07-16 LAB — CBC
HCT: 27.6 % — ABNORMAL LOW (ref 39.0–52.0)
Hemoglobin: 9.1 g/dL — ABNORMAL LOW (ref 13.0–17.0)
MCH: 32 pg (ref 26.0–34.0)
MCHC: 33 g/dL (ref 30.0–36.0)
MCV: 97.2 fL (ref 80.0–100.0)
Platelets: 137 10*3/uL — ABNORMAL LOW (ref 150–400)
RBC: 2.84 MIL/uL — ABNORMAL LOW (ref 4.22–5.81)
RDW: 12.8 % (ref 11.5–15.5)
WBC: 6.9 10*3/uL (ref 4.0–10.5)
nRBC: 0 % (ref 0.0–0.2)

## 2019-07-16 LAB — GLUCOSE, CAPILLARY
Glucose-Capillary: 113 mg/dL — ABNORMAL HIGH (ref 70–99)
Glucose-Capillary: 148 mg/dL — ABNORMAL HIGH (ref 70–99)
Glucose-Capillary: 154 mg/dL — ABNORMAL HIGH (ref 70–99)
Glucose-Capillary: 130 mg/dL — ABNORMAL HIGH (ref 70–99)

## 2019-07-16 IMAGING — DX DG CHEST 1V PORT
1 series · 1 of 1 positions shown · non-contrast
Comparison: [DATE]

CLINICAL DATA: Pneumothorax and left chest pain

EXAM:
PORTABLE CHEST 1 VIEW

[chest ap]
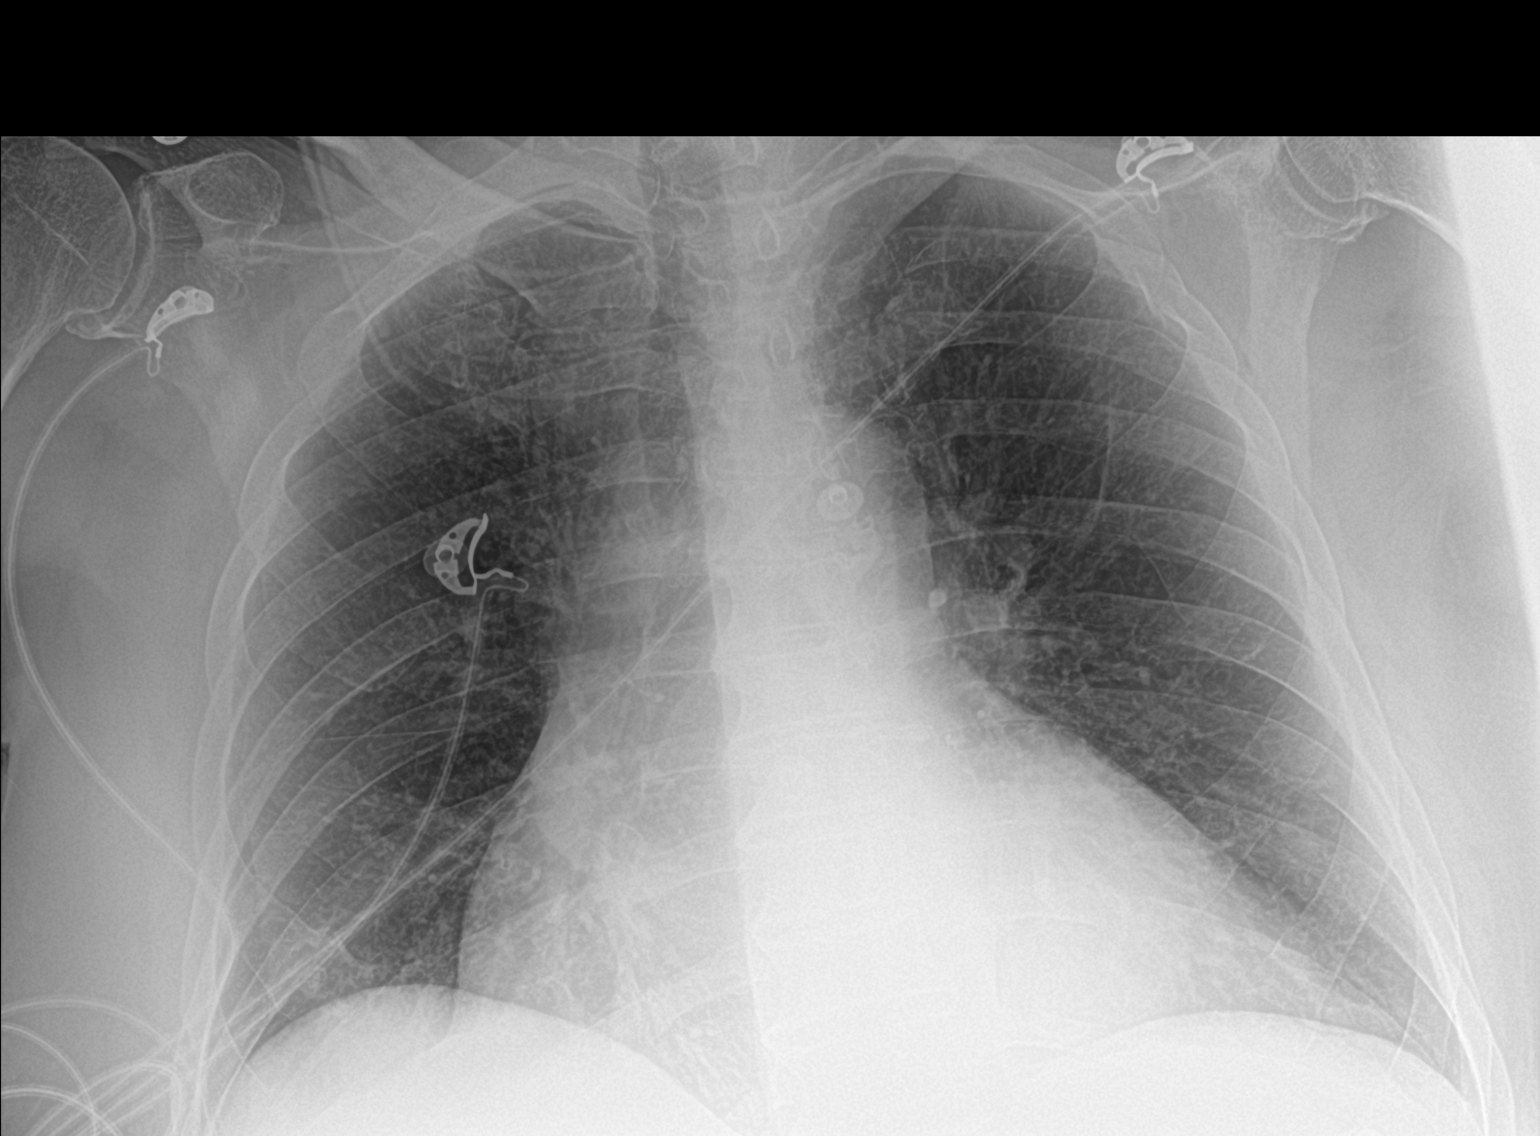

[1 of 1 positions shown; findings below may reference images not displayed]

FINDINGS: Cardiomegaly. Improved aeration at the left base. No visible
pneumothorax. Known left rib fractures.
IMPRESSION: No visible pneumothorax.  Mildly improved aeration.

## 2019-07-16 IMAGING — CR DG ANKLE COMPLETE 3+V*L*
4 series · 4 of 4 positions shown · non-contrast
Comparison: None.

CLINICAL DATA: Lateral ankle pain and swelling after fall 2 days
ago.

EXAM:
LEFT ANKLE COMPLETE - 3+ VIEW

[ankle ap]
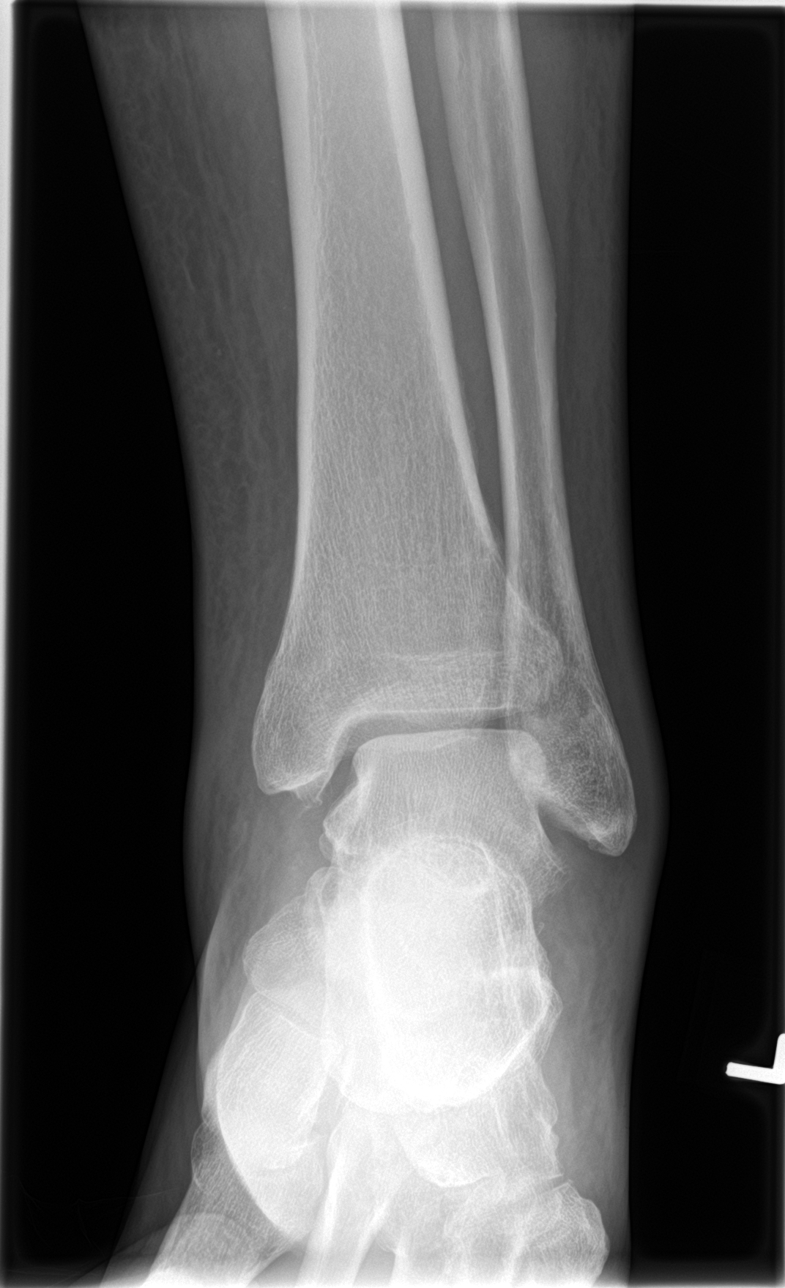

[ankle obl]
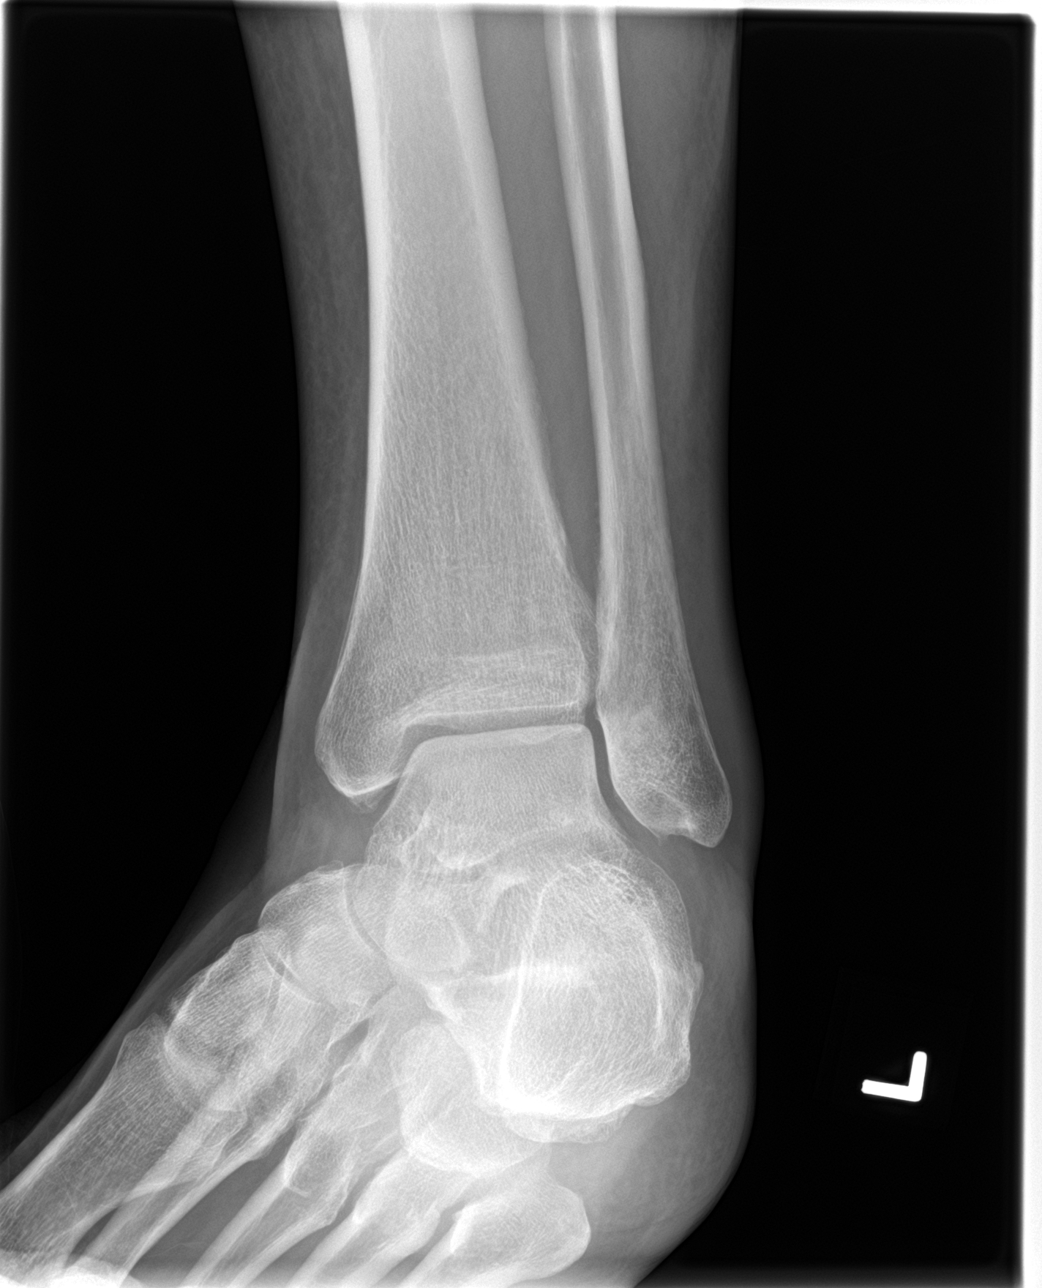

[ankle lat (1 of 2)]
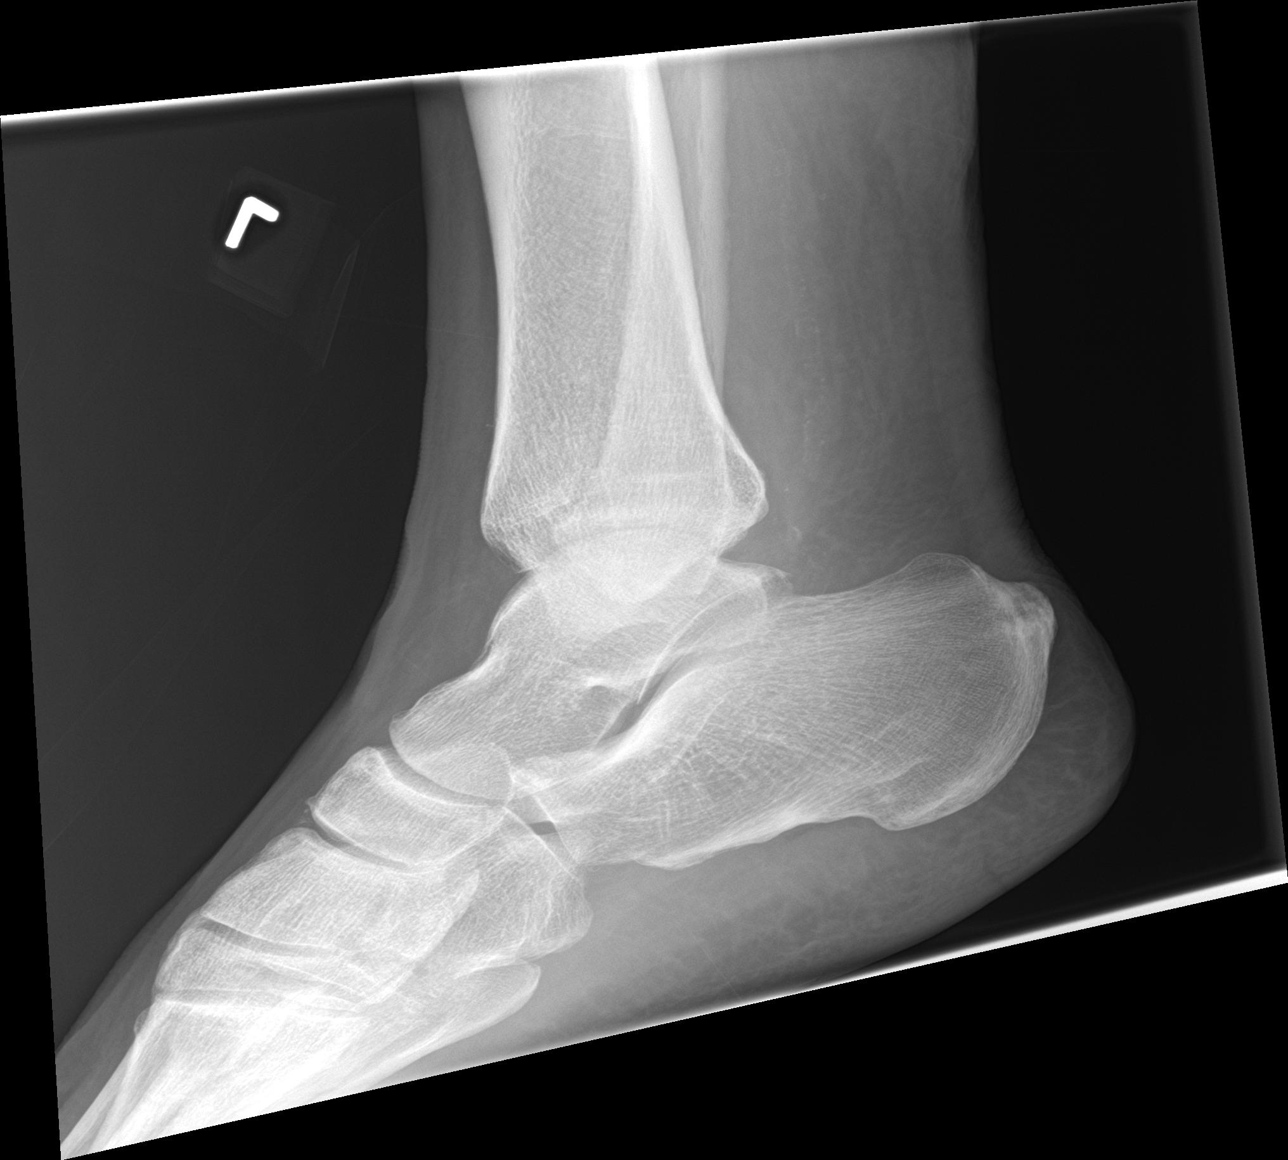

[ankle lat (2 of 2)]
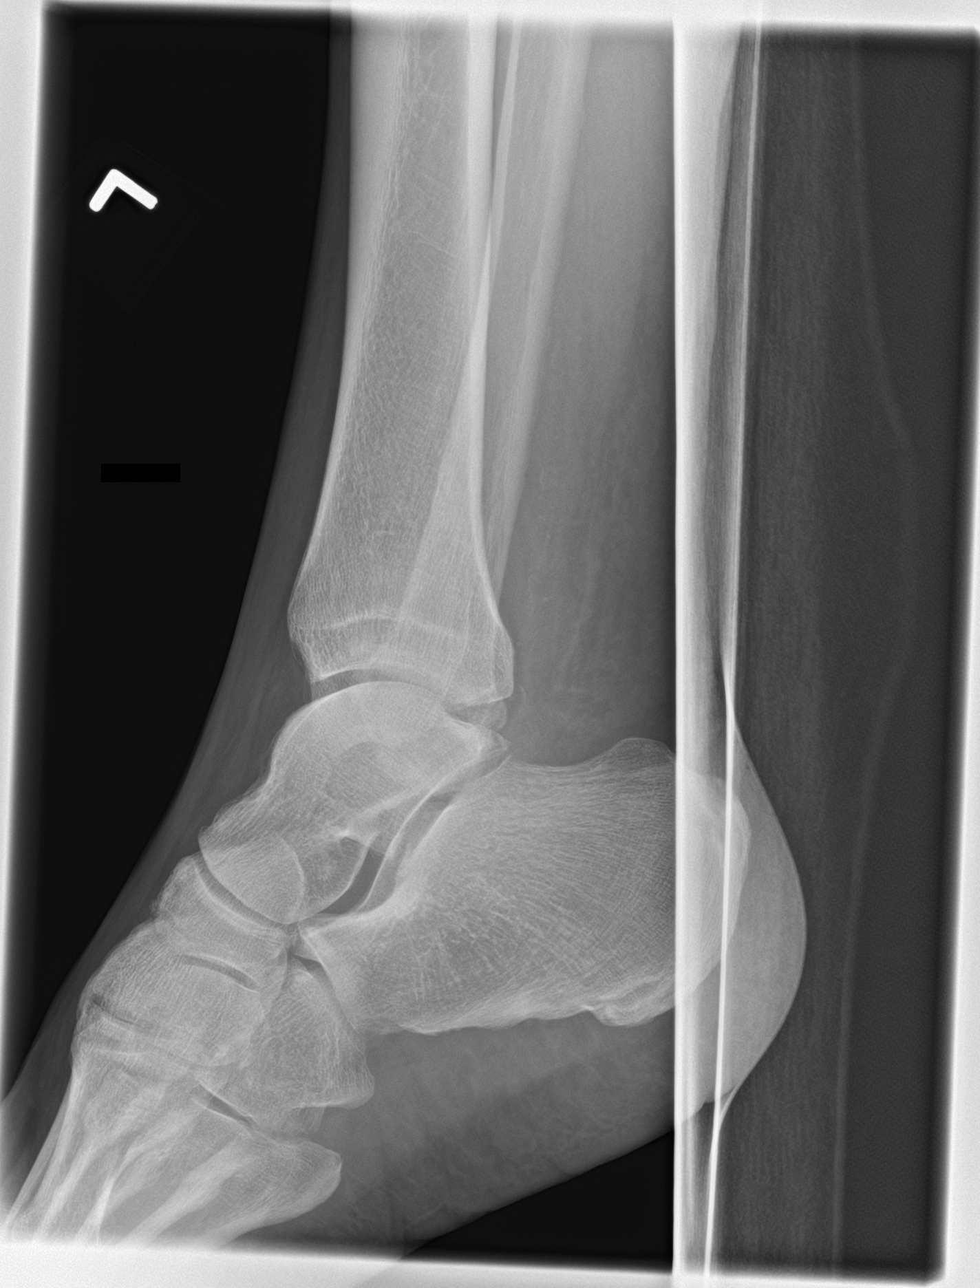

[4 of 4 positions shown; findings below may reference images not displayed]

FINDINGS: No acute fracture or dislocation. The ankle mortise is symmetric.
The talar dome is intact. No tibiotalar joint effusion. Joint spaces
are preserved. Bone mineralization is normal. Diffuse soft tissue
swelling.
IMPRESSION: 1. Diffuse soft tissue swelling. No acute osseous abnormality.

## 2019-07-16 MED ORDER — ENOXAPARIN SODIUM 30 MG/0.3ML ~~LOC~~ SOLN
30.0000 mg | Freq: Two times a day (BID) | SUBCUTANEOUS | Status: DC
  Administered 2019-07-16 – 2019-07-23 (×15): 30 mg via SUBCUTANEOUS
  Filled 2019-07-16 (×15): qty 0.3

## 2019-07-16 NOTE — TOC CAGE-AID Note (Signed)
Transition of Care [facility]) - CAGE-AID Screening   Patient Details  Name: [name] MRN: [id number] Date of Birth: 1933/02/17  [facility]) CM/SW Contact:    Emeterio Reeve, [place] Phone Number: 07/16/2019, 12:18 PM   Clinical Narrative:  Pt reports less than 1 beer 2-3 times a week. Pt denies substance use.   CAGE-AID Screening:    Have You Ever Felt You Ought to Cut Down on Your Drinking or Drug Use?: No Have People Annoyed You By Critizing Your Drinking Or Drug Use?: No Have You Felt Bad Or Guilty About Your Drinking Or Drug Use?: No Have You Ever Had a Drink or Used Drugs First Thing In The Morning to STeady Your Nerves or to Get Rid of a Hangover?: No CAGE-AID Score: 0  Substance Abuse Education Offered: Yes    Blima Ledger, [place] Social Worker [phone]

## 2019-07-16 NOTE — Progress Notes (Signed)
Patient ID: [name], male   DOB: 1933/10/18, 86 y.o.   MRN: [id number]       Subjective: Patient feeling much better today.  Still having pain but less at baseline.  Still having some neck pain.  Eating well.  Voiding well.  C/o some left hip, buttock pain.  C/o seeing a blood spot once last night, but this has resolved.  He is not seeing anything else such as "floaters" etc. C/o left ankle pain  ROS: See above, otherwise other systems negative  Objective: Vital signs in last 24 hours: Temp:  [97.7 F (36.5 C)-98.6 F (37 C)] 98 F (36.7 C) (06/10 0745) Pulse Rate:  [59-76] 70 (06/10 0745) Resp:  [13-19] 16 (06/10 0745) BP: (97-139)/(45-74) 122/69 (06/10 0745) SpO2:  [93 %-98 %] 98 % (06/10 0745) Last BM Date: 07/14/19  Intake/Output from previous day: 06/09 0701 - 06/10 0700 In: 1188.8 [P.O.:240; I.V.:948.8] Out: -  Intake/Output this shift: No intake/output data recorded.  PE: Gen: NAD HEENT: dried blood still present in hair.  PERRL Neck: still tender along midline when patient turns head to the right.  nontender with movement to the left or with forward flexion.  c-collar replaced. Heart: regular Lungs: CTAB, chest wall tenderness as expected Abd: soft, tender along left flank with ecchymosis and anasarca noted from chest down past hip.  +BS, ND Ext: some edema and point tenderness pain over lateral left malleolus.  Otherwise no pain in lower extremities.  Left hand in thumb spica splint.  Sling not in place while laying in bed. Neuro: sensation normal throughout Psych: A&Ox3.  Can be slightly forgetful at times  Lab Results:  Recent Labs    07/15/19 0617 07/15/19 1637  WBC 7.2 6.7  HGB 9.4* 9.4*  HCT 29.0* 28.4*  PLT 158 147*   BMET Recent Labs    07/14/19 2022 07/14/19 2022 07/14/19 2033 07/15/19 0617  NA 139   < > 139 137  K 4.5   < > 4.3 4.6  CL 105   < > 103 107  CO2 23  --   --  22  GLUCOSE 161*   < > 154* 197*  BUN 33*   < > 36* 36*    CREATININE 1.97*   < > 2.10* 1.85*  CALCIUM 9.0  --   --  8.5*   < > = values in this interval not displayed.   PT/INR Recent Labs    07/14/19 2022  LABPROT 12.8  INR 1.0   CMP     Component Value Date/Time   NA 137 07/15/2019 0617   K 4.6 07/15/2019 0617   CL 107 07/15/2019 0617   CO2 22 07/15/2019 0617   GLUCOSE 197 (H) 07/15/2019 0617   BUN 36 (H) 07/15/2019 0617   CREATININE 1.85 (H) 07/15/2019 0617   CALCIUM 8.5 (L) 07/15/2019 0617   PROT 6.2 (L) 07/14/2019 2022   ALBUMIN 3.5 07/14/2019 2022   AST 39 07/14/2019 2022   ALT 27 07/14/2019 2022   ALKPHOS 71 07/14/2019 2022   BILITOT 0.7 07/14/2019 2022   GFRNONAA 33 (L) 07/15/2019 0617   GFRAA 38 (L) 07/15/2019 0617   Lipase  No results found for: LIPASE     Studies/Results: CT Abdomen Pelvis Wo Contrast  Addendum Date: 07/15/2019   ADDENDUM REPORT: 07/15/2019 00:19 ADDENDUM: Additional comparison: Same day CT Chest 3.1 cm infrarenal abdominal aortic aneurysm. Recommend followup by ultrasound in 3 years. This recommendation follows ACR consensus guidelines: White  Paper of the ACR Incidental Findings Committee II on Vascular Findings. J Am Coll Radiol 2013; 10:789-794. Aortic aneurysm NOS (ICD10-I71.9) These results and addendum were called by telephone at the time of interpretation on 07/15/2019 at 12:18 am to provider Donne Hazel, who verbally acknowledged these results. Electronically Signed   By: Lovena Le M.D.   On: 07/15/2019 00:19   Result Date: 07/15/2019 CLINICAL DATA:  Fall down 14 stairs, abdominal bruising EXAM: CT ABDOMEN AND PELVIS WITHOUT CONTRAST TECHNIQUE: Multidetector CT imaging of the abdomen and pelvis was performed following the standard protocol without IV contrast. COMPARISON:  CT 10/15/2012, contemporary lumbar reconstructions. FINDINGS: Lower chest: Contusion and possible pulmonary laceration within the lingula (1/4) is seen adjacent lateral left sixth, seventh and eighth rib fractures. Small amount  of pleural fluid, likely hemorrhage. Small anterior pneumothorax (4/18). Additional contusive changes along the left chest wall. Mild cardiomegaly. Extensive three-vessel coronary artery calcifications. Small pericardial effusion. Hepatobiliary: No visible direct hepatic injury. No perihepatic hematoma. No visible lesions on this noncontrast exam. Normal a [place] attenuation. Smooth surface contour. Normal gallbladder and biliary tree. Pancreas: No contusive pancreatic changes or evidence of ductal disruption. No peripancreatic inflammation or visible pancreatic lesion on noncontrast study. Spleen: No direct splenic injury or perisplenic hemorrhage. Adrenals/Urinary Tract: No adrenal hemorrhage or suspicious adrenal lesions. There is mild bilateral perinephric stranding but with more focal stranding seen adjacent the posterior left kidney within the perirenal fat. Evaluation for a direct injury is limited in the absence of contrast media. No visible or contour deforming renal lesion. No urolithiasis or hydronephrosis. Indentation of the bladder base by an enlarged prostate with median lobe hypertrophy. Stomach/Bowel: Distal esophagus, stomach and duodenal sweep are unremarkable. No small bowel wall thickening or dilatation. No evidence of obstruction. Appendix is not visualized. No focal inflammation the vicinity of the cecum to suggest an occult appendicitis. No colonic dilatation or wall thickening. Scattered colonic diverticula without focal pericolonic inflammation to suggest diverticulitis. Vascular/Lymphatic: Atherosclerotic calcifications throughout the abdominal aorta and branch vessels. Focal abdominal aortic ectasia measuring up to 3.1 cm in the infrarenal abdominal aorta (1/41, 6/78). No enlarged abdominopelvic lymph nodes. Reproductive: Prostatomegaly with few coarse prostate calcifications. Other: Extensive left flank contusive change and body wall hematoma superficial to the left paraspinal  musculature and gluteal musculature. Musculoskeletal: Dedicated lumbar reconstructions generated and dictated separately. Please see report for further details of the lumbar spine. No acute fracture of the bony pelvis. Advanced degenerative changes in the bilateral hips. Mild degenerative changes at the SI joints and symphysis pubis. Enthesopathy throughout the pelvis. No suspicious osseous lesions. The osseous structures appear diffusely demineralized which may limit detection of small or nondisplaced fractures. Arthrosis of the left wrist incidentally included within the level of imaging. Some calcification of the triangular fibrocartilage noted as well possibly chondrocalcinosis/CPPD. IMPRESSION: 1. Extensive left flank contusive change and body wall hematoma superficial to the left paraspinal musculature and gluteal musculature. Hematoma measures up to 13 mm in maximal thickness (1/50). 2. Contusion and possible pulmonary laceration within the lingula with adjacent left sixth, seventh and eighth rib fractures. Small amount of pleural fluid, likely hemorrhage. Small anterior left pneumothorax. 3. Mild bilateral perinephric stranding but with more focal stranding, possibly reflecting contusive change, seen adjacent the posterior left kidney within the perirenal fat. Evaluation for a direct injury is limited in the absence of contrast media. 4. Dedicated lumbar reconstructions were generated and dictated separately. Please see full report for further details. 5. No other acute traumatic injuries  in the abdomen or pelvis. 6. Prostatomegaly with indentation of the bladder base. Correlate for symptoms of outlet obstruction. 7. Colonic diverticulosis without evidence of diverticulitis. 8. Advanced degenerative changes in the bilateral hips. 9. Aortic Atherosclerosis (ICD10-I70.0). Electronically Signed: By: Lovena Le M.D. On: 07/15/2019 00:12   CT HEAD WO CONTRAST  Result Date: 07/14/2019 CLINICAL DATA:  Status  post fall down stairs with a blow to the head. Headache. Initial encounter. EXAM: CT HEAD WITHOUT CONTRAST TECHNIQUE: Contiguous axial images were obtained from the base of the skull through the vertex without intravenous contrast. COMPARISON:  Head CT scan 08/31/2007. FINDINGS: Brain: A small amount of subarachnoid hemorrhage is seen on the left and best visualized along the left insula and high left parietal lobe. No subdural hemorrhage, mass, hydrocephalus, pneumocephalus or midline shift. No intraventricular blood is identified. Vascular: No hyperdense vessel or unexpected calcification. Skull: Intact.  No focal lesion. Sinuses/Orbits: Mucosal thickening in the maxillary sinuses is worse on the right. Ethmoid air cell disease also noted. Other: Scalp contusion over the left parietal bone is noted. IMPRESSION: Small volume of subarachnoid hemorrhage on the left. Scalp contusion on the left without underlying fracture is also seen. Sinus disease. Critical Value/emergent results were called by telephone at the time of interpretation on 07/14/2019 at 8:48 pm to provider Carmin Muskrat , who verbally acknowledged these results. Electronically Signed   By: Inge Rise M.D.   On: 07/14/2019 20:52   CT Chest Wo Contrast  Result Date: 07/14/2019 CLINICAL DATA:  [place] down stairs, back pain EXAM: CT CHEST WITHOUT CONTRAST TECHNIQUE: Multidetector CT imaging of the chest was performed following the standard protocol without IV contrast. COMPARISON:  07/14/2019, 07/29/2006 FINDINGS: Cardiovascular: Unenhanced imaging of the heart and great vessels demonstrates trace pericardial fluid. The heart is not enlarged. There is extensive atherosclerosis of the aorta and coronary vessels. Evaluation of the vascular lumen is limited without IV contrast. Mediastinum/Nodes: No enlarged mediastinal or axillary lymph nodes. Thyroid gland, trachea, and esophagus demonstrate no significant findings. Lungs/Pleura: There is a trace  left-sided pneumothorax adjacent to displaced left first through seventh rib fractures, with punctate foci of gas seen within the left pleural space. Volume estimated far less than 5%. There is a trace left pleural effusion. Patchy consolidation within the periphery of the left upper lobe consistent with contusion. Upper Abdomen: No acute abnormality. Musculoskeletal: There are minimally displaced left posterolateral first through seventh rib fractures. The distal left clavicle fracture seen on chest x-ray is not included on this exam due to slice selection. Reconstructed images demonstrate no additional findings. IMPRESSION: 1. Trace left-sided pneumothorax, with punctate foci of gas seen in the pleural space adjacent to multiple left-sided rib fractures. Volume estimated far less than 5%. 2. Trace left pleural effusion. 3. Minimally displaced left posterior first through seventh rib fractures. 4. Minimal left upper lobe contusion. 5.  Aortic Atherosclerosis (ICD10-I70.0). Electronically Signed   By: Randa Ngo M.D.   On: 07/14/2019 21:02   CT CERVICAL SPINE WO CONTRAST  Result Date: 07/14/2019 CLINICAL DATA:  Status post fall down stairs today. Initial encounter. EXAM: CT CERVICAL SPINE WITHOUT CONTRAST TECHNIQUE: Multidetector CT imaging of the cervical spine was performed without intravenous contrast. Multiplanar CT image reconstructions were also generated. COMPARISON:  None. FINDINGS: Alignment: Normal. Skull base and vertebrae: No acute fracture. No primary bone lesion or focal pathologic process. Soft tissues and spinal canal: No prevertebral fluid or swelling. No visible canal hematoma. Disc levels: Scattered facet degenerative change is  worst on the left at C2-3 and C3-4. Mild multilevel disc bulging is also seen. Upper chest: Lung apices clear. Other: None. IMPRESSION: No acute abnormality. Mild degenerative disease. Electronically Signed   By: Inge Rise M.D.   On: 07/14/2019 20:57   DG  Pelvis Portable  Result Date: 07/14/2019 CLINICAL DATA:  [place] down stairs. EXAM: PORTABLE PELVIS 1-2 VIEWS COMPARISON:  None. FINDINGS: There is no evidence of pelvic fracture or diastasis. No pelvic bone lesions are seen. IMPRESSION: Negative. Electronically Signed   By: Nelson Chimes M.D.   On: 07/14/2019 20:29   DG Hand 2 View Left  Result Date: 07/15/2019 CLINICAL DATA:  Thumb dislocation, postreduction. EXAM: LEFT HAND - 2 VIEW COMPARISON:  Radiograph earlier this day. FINDINGS: Improved alignment of the thumb interphalangeal joint post reduction. Small fracture fragment about the proximal phalanx is obscured on the current exam. Overlying splint material in place limiting osseous and soft tissue fine detail. IMPRESSION: Improved alignment of the thumb interphalangeal joint post reduction. Small fracture fragment about the proximal phalanx is obscured on the current exam. Electronically Signed   By: Keith Rake M.D.   On: 07/15/2019 00:00   CT L-SPINE NO CHARGE  Result Date: 07/15/2019 CLINICAL DATA:  Fall down 14 stairs EXAM: CT LUMBAR SPINE WITHOUT CONTRAST TECHNIQUE: Multiplanar reconstructions of the lumbar spine were generated from contemporary CT of the abdomen and pelvis without contrast. 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 Alignment: Slight levocurvature of the lumbar spine with an apex at the L2 level. Vertebral bodies and posterior elements are normally aligned without spondylolysis or spondylolisthesis. Vertebrae: No acute fracture or focal pathologic process. Multilevel discogenic endplate changes are present. Additional facet degenerative changes throughout the lumbar spine as well. Paraspinal and other soft tissues: Contusive changes of the left flank and superficial to the paraspinal musculature, better detailed on dedicated CT of  the abdomen and pelvis from which this study is reconstructed. Disc levels: Level by level evaluation of the lumbar spine below: T12-L1: Mild disc height loss without significant posterior disc abnormality. Mild bilateral facet arthropathy. No significant spinal canal or foraminal stenosis. L1-L2: Mild disc height loss without significant posterior disc abnormality. Moderate bilateral facet arthropathy. No significant canal stenosis. Mild bilateral foraminal narrowing. L2-L3: Moderate disc height loss with partially calcified global disc bulge. Moderate bilateral facet arthropathy. Mild canal stenosis and bilateral foraminal narrowing as well as partial effacement of the lateral recesses. L3-L4: Moderate disc height loss, vacuum disc, partially calcified global disc bulge slightly eccentric to the right subarticular and foraminal zones. Some mild ligamentum flavum redundancy and moderate bilateral facet arthropathy. Mild to moderate canal stenosis and moderate bilateral foraminal narrowing as well as effacement of the lateral recesses. L4-L5: Mild disc height loss with minimal global disc bulge. Mild bilateral facet arthropathy. No significant canal stenosis. At most mild bilateral foraminal narrowing. L5-S1: Moderate disc height loss and shallow global disc bulge with moderate bilateral facet arthropathy. No significant canal stenosis or foraminal narrowing. IMPRESSION: 1. No acute fracture or traumatic malalignment of the lumbar spine. 2. Contusive changes of the left flank and superficial to the paraspinal musculature, better detailed on dedicated CT of the abdomen and pelvis from which this study is reconstructed. 3. Multilevel discogenic and facet degenerative changes, as detailed above. Features maximal at L3-4 with mild-to-moderate canal stenosis, moderate bilateral foraminal narrowing and effacement of the  lateral recesses. These results were called by telephone at the time of interpretation on 07/15/2019 at  12:19 am to provider Donne Hazel, who verbally acknowledged these results. Electronically Signed   By: Lovena Le M.D.   On: 07/15/2019 00:29   DG Chest Port 1 View  Result Date: 07/16/2019 CLINICAL DATA:  Pneumothorax and left chest pain EXAM: PORTABLE CHEST 1 VIEW COMPARISON:  08/13/2019 FINDINGS: Cardiomegaly. Improved aeration at the left base. No visible pneumothorax. Known left rib fractures. IMPRESSION: No visible pneumothorax.  Mildly improved aeration. Electronically Signed   By: Monte Fantasia M.D.   On: 07/16/2019 07:57   DG Chest Portable 1 View  Addendum Date: 07/14/2019   ADDENDUM REPORT: 07/14/2019 20:32 ADDENDUM: There is a fracture of the distal left clavicle. Electronically Signed   By: Nelson Chimes M.D.   On: 07/14/2019 20:32   Result Date: 07/14/2019 CLINICAL DATA:  [place] down stairs. Pain in the left shoulder and hips. EXAM: PORTABLE CHEST 1 VIEW COMPARISON:  04/24/2017 FINDINGS: Enlarged cardiac silhouette. Mediastinal shadows are normal. Question atelectasis or infiltrate at the left lung base. No pneumothorax or hemothorax. There appear to be fractures of the left second third and fourth ribs, age indeterminate. IMPRESSION: Cardiomegaly.  Question left base infiltrate or atelectasis. Abnormal appearance of the left posterolateral second, third and fourth ribs, age indeterminate. Consider chest CT or rib detail films. Electronically Signed: By: Nelson Chimes M.D. On: 07/14/2019 20:29   DG Cerv Spine Flex&Ext Only  Result Date: 07/15/2019 CLINICAL DATA:  Recent fall with neck pain, initial encounter EXAM: CERVICAL SPINE - FLEXION AND EXTENSION VIEWS ONLY COMPARISON:  CT from previous day FINDINGS: Examination is significantly limited by overlying soft tissue density related to the shoulders. The cervical spine is visualized to C5. Flexion and extension views show no significant instability. Multilevel degenerative changes noted similar to that seen on recent CT. IMPRESSION: Limited  exam without significant instability. Electronically Signed   By: Inez Catalina M.D.   On: 07/15/2019 09:14   DG Shoulder Left Portable  Result Date: 07/14/2019 CLINICAL DATA:  [place] down stairs. EXAM: LEFT SHOULDER COMPARISON:  None. FINDINGS: Humeral head is normally located. No fracture of the humerus, or scapula. There is a fracture of the distal clavicle. There appear to be fractures of the left second, third, fourth and possibly fifth ribs. I favor these are acute. IMPRESSION: Probably acute fractures of the left second through fifth ribs posteriorly/laterally. Distal clavicular fracture. Electronically Signed   By: Nelson Chimes M.D.   On: 07/14/2019 20:31   DG Finger Thumb Left  Result Date: 07/14/2019 CLINICAL DATA:  Pain EXAM: LEFT THUMB 2+V COMPARISON:  None. FINDINGS: There is dislocation of the interphalangeal joint of the first digit. There is surrounding soft tissue swelling. There are advanced degenerative changes of the first carpometacarpal joint. There is an osseous fragment adjacent to the metacarpophalangeal joint which may represent a small avulsion fracture. IMPRESSION: 1. Dislocation of the interphalangeal joint of the first digit. 2. Possible avulsion fracture at the metacarpophalangeal joint. 3. Advanced degenerative changes of the first carpometacarpal joint. Electronically Signed   By: Constance Holster M.D.   On: 07/14/2019 21:32    Anti-infectives: Anti-infectives (From admission, onward)   None       Assessment/Plan Fall Distal clavicle fx - ortho recommends non op management with sling. Left thumb fx dislocation-ortho hand recommends non op management.  fx relocated and currently in thumb spica splint TBI/SAH- NSGY with no plans for repeat head  scan unless he changes neurologically.  slightly forgetfulness at times, but stable.  TBI therapies ordered Left posterior 1-7 rib fx with trace PTX - repeat CXR stable with no PTX.  No SOB today and sats good.  Pain control.   PT/OT for mobilization ABL anemia secondary to paraspinal muscle hematoma and abdominal wall contusion - cbc pending ? Perinephric contusion - UA negative for blood.  Likely from above ARI - questions some chronic component since presenting Cr 1.97 and wife states his cr is generally mildly abnormal with all of this labs.  Cr down to 1.85 yesterday.  labs pending this am.  Continue gentle hydration Neck Pain - ct cervial negative, but with pain.  Flex-ex relatively nondiagnostic given patient's inability to really flex-extend as needed.  Continue c-collar for now as patient still with pain with left lateral movement of his neck along his midline HTN - resume home meds Prediabetes - hgbA1c of 6.5.  Carb mod diet and SSI as needed FEN - carb mod diet VTE - hold for now due to [facility] and hematoma, await labs this am ID - none currently needed   LOS: 2 days    Henreitta Cea , [facility] 07/16/2019, 8:35 AM Please see Amion for pager number during day hours 7:00am-4:30pm or 7:00am -11:30am on weekends

## 2019-07-16 NOTE — Evaluation (Signed)
Physical Therapy Evaluation Patient Details Name: [name] MRN: [id number] DOB: 10-25-1933 Today's Date: 07/16/2019   History of Present Illness  86 y.o. male with prior MI, HTN presents after unwitnessed fall down stairs. Pt has a history of falls. Pt found to have L distal clavicle fx and L thumb fx, L posterior ribs 1-7 fx, and small SAH.  Clinical Impression  Pt presents to PT with deficits in functional mobility, strength, power, endurance, cognition, safety awareness, and gait. Pt currently requires physical assistance to perform all functional mobility, significantly limited by pain in L side. Pt demonstrates some cognitive deficits throughout session and may be attempting to mask these at times with use of humor. Pt will benefit from continued aggressive PT POC and mobilization to improve mobility quality and to reduce falls risk. PT currently recommends CIR at time of discharge due to current physical assistance needs and due to the patient's high level of function prior to this fall.    Follow Up Recommendations CIR    Equipment Recommendations  Wheelchair (measurements PT);Wheelchair cushion (measurements PT)    Recommendations for Other Services Rehab consult     Precautions / Restrictions Precautions Precautions: Fall;Cervical Precaution Booklet Issued: No Precaution Comments: PT verbally reviews sternal and WB precautions Required Braces or Orthoses: Sling;Cervical Brace Cervical Brace: Hard collar;At all times Restrictions Weight Bearing Restrictions: Yes LUE Weight Bearing: Non weight bearing Other Position/Activity Restrictions: LUE sling      Mobility  Bed Mobility Overal bed mobility: Needs Assistance Bed Mobility: Rolling;Sidelying to Sit Rolling: Max assist Sidelying to sit: Max assist;HOB elevated          Transfers Overall transfer level: Needs assistance Equipment used: 1 person hand held assist Transfers: Sit to/from [employer] Sit to Stand: Min assist;From elevated surface Stand pivot transfers: Mod assist          Ambulation/Gait                Stairs            Wheelchair Mobility    Modified Rankin (Stroke Patients Only) Modified Rankin (Stroke Patients Only) Pre-Morbid Rankin Score: No symptoms Modified Rankin: Moderately severe disability     Balance                                             Pertinent Vitals/Pain Pain Assessment: 0-10 Pain Score: 10-Worst pain ever Pain Location: ribs, L hip Pain Descriptors / Indicators: Aching Pain Intervention(s): Premedicated before session    Home Living Family/patient expects to be discharged to:: Private residence Living Arrangements: Spouse/significant other Available Help at Discharge: Family;Available 24 hours/day Type of Home: House Home Access: Stairs to enter Entrance Stairs-Rails: Psychiatric nurse of Steps: 5 Home Layout: Two level (may be able to convert room downstairs to bedroom) Home Equipment:  (walking stick)      Prior Function Level of Independence: Independent with assistive device(s)         Comments: pt utilizes walking stick for community distances     Hand Dominance        Extremity/Trunk Assessment   Upper Extremity Assessment Upper Extremity Assessment: LUE deficits/detail LUE Deficits / Details: strength assessment deferred 2/2 pain. Pt with intact elbow and wrist ROM. Sling donned    Lower Extremity Assessment Lower Extremity Assessment: Generalized weakness;LLE deficits/detail LLE Deficits / Details: L weakness  more significant with ecchymosis noted at L hip and ankle. Pain with WB through LLE. Grossly 3/5    Cervical / Trunk Assessment Cervical / Trunk Assessment: Other exceptions Cervical / Trunk Exceptions: C-collar donned throughout session  Communication   Communication: No difficulties  Cognition Arousal/Alertness:  Awake/alert Behavior During Therapy: WFL for tasks assessed/performed Overall Cognitive Status: Impaired/Different from baseline Area of Impairment: Orientation;Attention;Memory;Safety/judgement;Awareness;Problem solving                 Orientation Level: Disoriented to;Place;Time (identifies place with choice of 2, thinks its june 15) Current Attention Level: Sustained Memory: Decreased recall of precautions;Decreased short-term memory   Safety/Judgement: Decreased awareness of safety;Decreased awareness of deficits Awareness: Emergent Problem Solving: Slow processing;Requires verbal cues        General Comments General comments (skin integrity, edema, etc.): VSS on RA    Exercises     Assessment/Plan    PT Assessment Patient needs continued PT services  PT Problem List Decreased strength;Decreased activity tolerance;Decreased balance;Decreased mobility;Decreased cognition;Decreased knowledge of use of DME;Decreased safety awareness;Decreased knowledge of precautions;Pain       PT Treatment Interventions DME instruction;Gait training;Stair training;Functional mobility training;Therapeutic activities;Therapeutic exercise;Balance training;Neuromuscular re-education;Patient/family education;Cognitive remediation    PT Goals (Current goals can be found in the Care Plan section)  Acute Rehab PT Goals Patient Stated Goal: To return to independent mobility PT Goal Formulation: With patient Time For Goal Achievement: 07/30/19 Potential to Achieve Goals: Good    Frequency Min 5X/week   Barriers to discharge        Co-evaluation               AM-PAC PT "6 Clicks" Mobility  Outcome Measure Help needed turning from your back to your side while in a flat bed without using bedrails?: Total Help needed moving from lying on your back to sitting on the side of a flat bed without using bedrails?: Total Help needed moving to and from a bed to a chair (including a  wheelchair)?: A Lot Help needed standing up from a chair using your arms (e.g., wheelchair or bedside chair)?: A Little Help needed to walk in hospital room?: Total Help needed climbing 3-5 steps with a railing? : Total 6 Click Score: 9    End of Session Equipment Utilized During Treatment: Cervical collar Activity Tolerance: Patient limited by pain Patient left: in chair;with call bell/phone within reach;with chair alarm set Nurse Communication: Mobility status PT Visit Diagnosis: Other abnormalities of gait and mobility (R26.89);History of falling (Z91.81);Pain Pain - Right/Left: Left Pain - part of body: Shoulder;Hip;Ankle and joints of foot    Time: 0981-1914 PT Time Calculation (min) (ACUTE ONLY): 50 min   Charges:   PT Evaluation $PT Eval Moderate Complexity: 1 Mod PT Treatments $Therapeutic Activity: 8-22 mins        Zenaida Niece, PT, DPT Acute Rehabilitation Pager: [phone]   Zenaida Niece 07/16/2019, 10:45 AM

## 2019-07-16 NOTE — Evaluation (Signed)
Occupational Therapy Evaluation Patient Details Name: [name] MRN: [id number] DOB: Dec 24, 1933 Today's Date: 07/16/2019    History of Present Illness 86 y.o. male with prior MI, HTN presents after unwitnessed fall down stairs. Pt has a history of falls. Pt found to have L distal clavicle fx and L thumb fx, L posterior ribs 1-7 fx, and small SAH.   Clinical Impression   PTA patient independent. Admitted for above and limited by problem list below, including impaired cognition, decreased activity tolerance, pain, impaired balance, and generalized weakness.  Re-oriented to place, follows simple commands with increased time, but noted poor recall, problem solving and awareness.  Patient requires min-total assist for ADLs at this time.  L UE in sling and with thumb spica splint on, repositioned in recliner with max assist due to pain in ribs and mild unsteadiness with dynamic sitting balance unsupported in recliner with 0 hand support.  Patient will benefit from continued OT services while admitted and after dc at CIR level to optimize independence with ADls and mobility.     Follow Up Recommendations  CIR    Equipment Recommendations  Other (comment) (TBD at next venue of care )    Recommendations for Other Services       Precautions / Restrictions Precautions Precautions: Fall;Cervical Precaution Booklet Issued: No Precaution Comments: reviewed precuations with pt  Required Braces or Orthoses: Sling;Cervical Brace;Other Brace Cervical Brace: Hard collar;At all times Other Brace: thumb spica splint L hand  Restrictions Weight Bearing Restrictions: Yes LUE Weight Bearing: Non weight bearing Other Position/Activity Restrictions: LUE sling      Mobility Bed Mobility Overal bed mobility: Needs Assistance Bed Mobility: Rolling;Sidelying to Sit Rolling: Max assist Sidelying to sit: Max assist;HOB elevated       General bed mobility comments: OOB in recliner upon entry    Transfers Overall transfer level: Needs assistance Equipment used: 1 person hand held assist Transfers: Sit to/from [employer] Sit to Stand: Min assist;From elevated surface Stand pivot transfers: Mod assist       General transfer comment: deferred due to fatigue and pain     Balance Overall balance assessment: Needs assistance Sitting-balance support: Single extremity supported;Feet supported;No upper extremity supported Sitting balance-Leahy Scale: Fair Sitting balance - Comments: min guard sitting unsupported in chair without R UE support                                    ADL either performed or assessed with clinical judgement   ADL Overall ADL's : Needs assistance/impaired     Grooming: Minimal assistance;Sitting;Oral care   Upper Body Bathing: Maximal assistance;Sitting   Lower Body Bathing: Total assistance;Sitting/lateral leans   Upper Body Dressing : Maximal assistance;Sitting   Lower Body Dressing: Total assistance;Sitting/lateral leans     Toilet Transfer Details (indicate cue type and reason): deferred         Functional mobility during ADLs: Maximal assistance;Cueing for safety;Cueing for sequencing General ADL Comments: pt limited by pain, decreased functional use of L UE, impaired balance, and cognition     Vision Baseline Vision/History: Wears glasses Wears Glasses: Reading only Patient Visual Report: No change from baseline Additional Comments: no difficulties reading menu     Perception     Praxis      Pertinent Vitals/Pain Pain Assessment: Faces Pain Score: 10-Worst pain ever Faces Pain Scale: Hurts whole lot Pain Location: ribs, L hip Pain Descriptors /  Indicators: Aching Pain Intervention(s): Premedicated before session;Repositioned;Limited activity within patient's tolerance     Hand Dominance Right   Extremity/Trunk Assessment Upper Extremity Assessment Upper Extremity Assessment: LUE  deficits/detail LUE Deficits / Details: in sling with thumb spica splint; deferred ROM due to pain  LUE: Unable to fully assess due to pain LUE Coordination: decreased gross motor;decreased fine motor   Lower Extremity Assessment Lower Extremity Assessment: Defer to PT evaluation LLE Deficits / Details: L weakness more significant with ecchymosis noted at L hip and ankle. Pain with WB through LLE. Grossly 3/5   Cervical / Trunk Assessment Cervical / Trunk Assessment: Other exceptions Cervical / Trunk Exceptions: C-collar on    Communication Communication Communication: No difficulties   Cognition Arousal/Alertness: Awake/alert Behavior During Therapy: WFL for tasks assessed/performed Overall Cognitive Status: Impaired/Different from baseline Area of Impairment: Orientation;Attention;Memory;Safety/judgement;Awareness;Problem solving                 Orientation Level: Disoriented to;Place Current Attention Level: Sustained Memory: Decreased recall of precautions;Decreased short-term memory   Safety/Judgement: Decreased awareness of safety;Decreased awareness of deficits Awareness: Emergent Problem Solving: Slow processing;Requires verbal cues;Difficulty sequencing General Comments: patient re-oriented to place, repeats self several times during session, reports recall of situation only from what his spouse has told him, follows commands with incresed time    General Comments  pt repositioned in recliner for comfort, spouse present and supportive    Exercises     Shoulder Instructions      Home Living Family/patient expects to be discharged to:: Private residence Living Arrangements: Spouse/significant other Available Help at Discharge: Family;Available 24 hours/day Type of Home: House Home Access: Stairs to enter [employer] of Steps: 5 Entrance Stairs-Rails: Right;Left Home Layout: Two level Alternate Level Stairs-Number of Steps: 13 Alternate Level  Stairs-Rails: Right Bathroom Shower/Tub: Teacher, early years/pre: Standard     Home Equipment:  (walking stick)          Prior Functioning/Environment Level of Independence: Independent with assistive device(s)        Comments: pt utilizes walking stick for community distances        OT Problem List: Decreased strength;Decreased range of motion;Decreased activity tolerance;Impaired balance (sitting and/or standing);Decreased coordination;Decreased cognition;Decreased safety awareness;Decreased knowledge of use of DME or AE;Decreased knowledge of precautions;Pain;Impaired UE functional use      OT Treatment/Interventions: Self-care/ADL training;Therapeutic exercise;DME and/or AE instruction;Therapeutic activities;Cognitive remediation/compensation;Visual/perceptual remediation/compensation;Patient/family education;Balance training    OT Goals(Current goals can be found in the care plan section) Acute Rehab OT Goals Patient Stated Goal: To return to independent mobility OT Goal Formulation: With patient Time For Goal Achievement: 07/30/19 Potential to Achieve Goals: Good  OT Frequency: Min 2X/week   Barriers to D/C:            Co-evaluation              AM-PAC OT "6 Clicks" Daily Activity     Outcome Measure Help from another person eating meals?: A Little Help from another person taking care of personal grooming?: A Little Help from another person toileting, which includes using toliet, bedpan, or urinal?: Total Help from another person bathing (including washing, rinsing, drying)?: A Lot Help from another person to put on and taking off regular upper body clothing?: A Lot Help from another person to put on and taking off regular lower body clothing?: Total 6 Click Score: 12   End of Session Equipment Utilized During Treatment: Other (comment) (sling ) Nurse Communication: Mobility status;Precautions  Activity Tolerance: Patient limited by  pain Patient left: in chair;with call bell/phone within reach;with chair alarm set;with family/visitor present  OT Visit Diagnosis: Pain;Other symptoms and signs involving cognitive function;History of falling (Z91.81);Muscle weakness (generalized) (M62.81);Other abnormalities of gait and mobility (R26.89) Pain - Right/Left: Left Pain - part of body: Arm;Leg (ribs)                Time: 8882-8003 OT Time Calculation (min): 30 min Charges:  OT General Charges $OT Visit: 1 Visit OT Evaluation $OT Eval Moderate Complexity: 1 Mod OT Treatments $Self Care/Home Management : 8-22 mins  Jolaine Artist, OT Acute Rehabilitation Services Pager [phone] Office [phone]    Delight Stare 07/16/2019, 12:49 PM

## 2019-07-16 NOTE — Consult Note (Signed)
Physical Medicine and Rehabilitation Consult   Reason for Consult:Functional deficits due to fall with TBI Referring Physician: Dr. Grandville Silos  HPI: [name] is a 86 y.o. male with history of BPH, CAD, HTN, CKD III prediabetes, prior falls; who was admitted on 07/14/2019 after unwitnessed fall down some stairs with amnesia of events and had reports of headache, back and chest pain.  History taken from chart review, patient, and wife due cognition.  Head CT personally reviewed, showing small left SAH.  Work-up revealed small volume SAH on left with scalp contusion, trace left PTX with left 1st-7th rib fractures, mild LUL contusion, 3.1 cm AAA, distal clavicle fracture, dislocation of left interphalangeal joint fourth digit and possible avulsion fracture of MCP joint with advanced degenerative changes first MCP.  Dr. Cyndy Freeze recommended conservative management with repeat CT head if neurological decline noted.  Cervical spine CT showed mild multilevel DDD. CT lumbar spine showed contusion to left flank and paraspinal musculature with multilevel DDD and mild to moderate canal stenosis at L3/L4. Ortho recommended short period of sling immobilization. LUE and left thumb closed reduced and splinted--to follow up with Dr. Doreatha Martin in 2 weeks. Hard collar in place pending flex/extension views. Is NWB in LUE. Therapy evaluations completed and patient noted to have unsteady gait with decreased activity tolerance, cognitive deficits with problems difficulty processing simple command, poor recall and decreased problem solving. Wife reports some issues with mild intermittent confusion and memory last year since isolation for pandemic, however improving since admission to the hospital. CIR recommended due to functional decline.  Hospital course was complicated by hyperglycemia as as well as ABLA and AKI.  Patient with associated numbness as well.  Case discussed with insurance MD as well.   Review of Systems    Unable to perform ROS: Other  Limited due to cognition  Past Medical History:  Diagnosis Date  . BPH (benign prostatic hyperplasia)   . Coronary artery disease   . High cholesterol   . Hypertension   . MI (myocardial infarction) ([place])   . Prediabetes     Past Surgical History:  Procedure Laterality Date  . APPENDECTOMY    . CARDIAC CATHETERIZATION    . EXPLORATORY LAPAROTOMY      Family History  Problem Relation Age of Onset  . Stomach cancer Mother   . Heart disease Father   . Heart disease Brother   . Aortic aneurysm Brother   . Colon cancer Brother      Social History:  [name] is off for the summer.  Also has twin daughters that are 75 years old that are working from home and can help with discharge.  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 He has been relatively active. He quit smoking 40 years ago. He has never used smokeless tobacco. He reports that he drink alcohol--4-6 glasses of wine/week.  and does not use drugs.    Allergies: No Known Allergies    Medications Prior to Admission  Medication Sig Dispense Refill  . amLODipine-benazepril (LOTREL) 5-20 MG capsule Take 1 capsule by mouth in the morning and at bedtime.     Marland Kitchen aspirin EC 81 MG tablet Take 81 mg by mouth daily.    Marland Kitchen atorvastatin (LIPITOR) 40 MG tablet Take 40 mg by mouth daily.    . Calcium-Vitamin D 600-200 MG-UNIT tablet Take 1 tablet by mouth 2 (two) times daily.    . diphenhydrAMINE (BENADRYL)  25 MG tablet Take 25 mg by mouth daily as needed.    . doxazosin (CARDURA) 2 MG tablet Take 2 mg by mouth daily.    Marland Kitchen escitalopram (LEXAPRO) 10 MG tablet Take 10 mg by mouth daily.    Marland Kitchen ezetimibe (ZETIA) 10 MG tablet Take 10 mg by mouth daily.    . fluticasone (FLONASE) 50 MCG/ACT nasal spray Place 1 spray into both nostrils daily as needed for allergies or rhinitis.    . Multiple Vitamin (MULTIVITAMIN WITH MINERALS) TABS tablet Take 1 tablet by mouth  daily.    . traMADol (ULTRAM) 50 MG tablet Take 50 mg by mouth every 6 (six) hours as needed for moderate pain.      Home: Home Living Family/patient expects to be discharged to:: Private residence Living Arrangements: Spouse/significant other Available Help at Discharge: Family, Available 24 hours/day Type of Home: House Home Access: Stairs to enter [employer] of Steps: 5 Entrance Stairs-Rails: Right, Left Home Layout: Two level Alternate Level Stairs-Number of Steps: 13 Alternate Level Stairs-Rails: Right Bathroom Shower/Tub: Chiropodist: Standard Home Equipment:  (walking stick)  Functional History: Prior Function Level of Independence: Independent with assistive device(s) Comments: pt utilizes walking stick for community distances Functional Status:  Mobility: Bed Mobility Overal bed mobility: Needs Assistance Bed Mobility: Rolling, Sidelying to Sit Rolling: Max assist Sidelying to sit: Max assist, HOB elevated General bed mobility comments: OOB in recliner upon entry  Transfers Overall transfer level: Needs assistance Equipment used: 1 person hand held assist Transfers: Sit to/from Stand, Stand Pivot Transfers Sit to Stand: Min assist, From elevated surface Stand pivot transfers: Mod assist General transfer comment: deferred due to fatigue and pain       ADL: ADL Overall ADL's : Needs assistance/impaired Grooming: Minimal assistance, Sitting, Oral care Upper Body Bathing: Maximal assistance, Sitting Lower Body Bathing: Total assistance, Sitting/lateral leans Upper Body Dressing : Maximal assistance, Sitting Lower Body Dressing: Total assistance, Sitting/lateral leans Toilet Transfer Details (indicate cue type and reason): deferred Functional mobility during ADLs: Maximal assistance, Cueing for safety, Cueing for sequencing General ADL Comments: pt limited by pain, decreased functional use of L UE, impaired balance, and  cognition  Cognition: Cognition Overall Cognitive Status: Impaired/Different from baseline Orientation Level: Oriented X4 Cognition Arousal/Alertness: Awake/alert Behavior During Therapy: WFL for tasks assessed/performed Overall Cognitive Status: Impaired/Different from baseline Area of Impairment: Orientation, Attention, Memory, Safety/judgement, Awareness, Problem solving Orientation Level: Disoriented to, Place Current Attention Level: Sustained Memory: Decreased recall of precautions, Decreased short-term memory Safety/Judgement: Decreased awareness of safety, Decreased awareness of deficits Awareness: Emergent Problem Solving: Slow processing, Requires verbal cues, Difficulty sequencing General Comments: patient re-oriented to place, repeats self several times during session, reports recall of situation only from what his spouse has told him, follows commands with incresed time    Blood pressure (!) 98/41, pulse 80, temperature 98 F (36.7 C), temperature source Oral, resp. rate 12, height 5\' 7"  (1.702 m), weight 99.8 kg, SpO2 97 %. Physical Exam  Nursing note and vitals reviewed. Constitutional: He is cooperative. No distress.  Sitting in a chair --slumped to the right. Dried blood left scalp. C-Collar in place. LUE in sling and splint on left hand--limited by pain.   HENT:  Head: Normocephalic.  Right Ear: External ear normal.  Left Ear: External ear normal.  Mouth/Throat: Mucous membranes are dry. No oropharyngeal exudate.  Eyes: Right eye exhibits no discharge. Left eye exhibits no discharge.  Respiratory: Effort normal. No respiratory distress.  GI:  Soft. Normal appearance.  Musculoskeletal:     Cervical back: Normal range of motion and neck supple.     Comments: Left medial ankle with discomfort on ROM Left upper extremity with edema and tenderness  Neurological: He is alert.  Speech clear.  Able to answer biographic questions and follow simple commands without  difficulty but tends to repeat himself at times.  Alert and oriented x2 with increased time Confusion Motor: Right upper extremity: 4/5 proximal distal (pain inhibition) Left upper extremity controlled abduction, elbow flexion/extension 3 -/5, wrist braced (pain inhibition)   right lower extremity: 4+/5 proximal distal Left lower extremity: Hip flexion, knee extension 2 -/5, ankle dorsiflexion 1/5 (pain inhibition) Sensation diminished light touch left foot  Skin: Skin is warm and dry.  Left wrist with brace C/D/I  Psychiatric: Affect normal. His speech is tangential.  Confused    Results for orders placed or performed during the hospital encounter of 07/14/19 (from the past 24 hour(s))  Glucose, capillary     Status: Abnormal   Collection Time: 07/16/19  4:38 PM  Result Value Ref Range   Glucose-Capillary 154 (H) 70 - 99 mg/dL  Glucose, capillary     Status: Abnormal   Collection Time: 07/16/19  9:31 PM  Result Value Ref Range   Glucose-Capillary 130 (H) 70 - 99 mg/dL  CBC     Status: Abnormal   Collection Time: 07/17/19  5:56 AM  Result Value Ref Range   WBC 7.3 4.0 - 10.5 K/uL   RBC 2.60 (L) 4.22 - 5.81 MIL/uL   Hemoglobin 8.4 (L) 13.0 - 17.0 g/dL   HCT 25.9 (L) 39 - 52 %   MCV 99.6 80.0 - 100.0 fL   MCH 32.3 26.0 - 34.0 pg   MCHC 32.4 30.0 - 36.0 g/dL   RDW 12.9 11.5 - 15.5 %   Platelets 146 (L) 150 - 400 K/uL   nRBC 0.0 0.0 - 0.2 %  Basic metabolic panel     Status: Abnormal   Collection Time: 07/17/19  5:56 AM  Result Value Ref Range   Sodium 137 135 - 145 mmol/L   Potassium 4.7 3.5 - 5.1 mmol/L   Chloride 105 98 - 111 mmol/L   CO2 25 22 - 32 mmol/L   Glucose, Bld 123 (H) 70 - 99 mg/dL   BUN 41 (H) 8 - 23 mg/dL   Creatinine, Ser 2.20 (H) 0.61 - 1.24 mg/dL   Calcium 8.3 (L) 8.9 - 10.3 mg/dL   GFR calc non Af Amer 27 (L) >60 mL/min   GFR calc Af Amer 31 (L) >60 mL/min   Anion gap 7 5 - 15  Glucose, capillary     Status: Abnormal   Collection Time: 07/17/19  8:02  AM  Result Value Ref Range   Glucose-Capillary 127 (H) 70 - 99 mg/dL  Glucose, capillary     Status: Abnormal   Collection Time: 07/17/19 11:27 AM  Result Value Ref Range   Glucose-Capillary 143 (H) 70 - 99 mg/dL   DG Ankle Complete Left  Result Date: 07/16/2019 CLINICAL DATA:  Lateral ankle pain and swelling after fall 2 days ago. EXAM: LEFT ANKLE COMPLETE - 3+ VIEW COMPARISON:  None. FINDINGS: No acute fracture or dislocation. The ankle mortise is symmetric. The talar dome is intact. No tibiotalar joint effusion. Joint spaces are preserved. Bone mineralization is normal. Diffuse soft tissue swelling. IMPRESSION: 1. Diffuse soft tissue swelling. No acute osseous abnormality. Electronically Signed   By: Huntley Dec  Derry M.D.   On: 07/16/2019 09:27   MR CERVICAL SPINE WO CONTRAST  Result Date: 07/17/2019 CLINICAL DATA:  Neck trauma with midline tenderness. EXAM: MRI CERVICAL SPINE WITHOUT CONTRAST TECHNIQUE: Multiplanar, multisequence MR imaging of the cervical spine was performed. No intravenous contrast was administered. COMPARISON:  CT of the cervical spine July 14, 2019 FINDINGS: Alignment: Physiologic. Vertebrae: No fracture, evidence of discitis, or bone lesion. Cord: Normal signal and morphology. Posterior Fossa, vertebral arteries, paraspinal tissues: Absent flow void within the distal V2 segment of the left vertebral artery, may represent slow flow versus occlusion. Disc levels: C2-3: Small posterior disc protrusion causing minimal indentation thecal sac without significant spinal canal stenosis. Uncovertebral and facet degenerative changes resulting in mild to moderate left neural foraminal narrowing. C3-4: Posterior disc protrusion causing mild spinal canal stenosis. Uncovertebral and facet degenerative changes resulting in mild right and moderate left neural foraminal narrowing. C4-5: Posterior disc osteophyte complex resulting in mild spinal canal stenosis. Uncovertebral and facet  degenerative changes resulting in mild-to-moderate right and mild left neural foraminal narrowing. C5-6: Small posterior disc protrusion causing mild spinal canal stenosis. Uncovertebral and facet degenerative changes resulting in moderate right neural foraminal narrowing. C6-7: Posterior disc protrusion causing small indentation of the thecal sac without significant spinal canal stenosis. Uncovertebral and facet degenerative changes result in moderate right neural foraminal narrowing. C7-T1: No spinal canal or neural foraminal stenosis. IMPRESSION: 1. No acute osseous abnormality or abnormal cord signal. 2. Cervical spondylosis with multilevel posterior disc protrusions resulting in mild spinal canal stenosis from C3-4 through C5-6. 3. Moderate neural foraminal narrowing on the left at C2-3 and C3-4 and on the right at C5-6 and C6-7. 4. Absent flow void within the distal V2 segment of the left vertebral artery, may represent slow flow versus occlusion. Electronically Signed   By: Pedro Earls M.D.   On: 07/17/2019 08:42   DG Chest Port 1 View  Result Date: 07/16/2019 CLINICAL DATA:  Pneumothorax and left chest pain EXAM: PORTABLE CHEST 1 VIEW COMPARISON:  08/13/2019 FINDINGS: Cardiomegaly. Improved aeration at the left base. No visible pneumothorax. Known left rib fractures. IMPRESSION: No visible pneumothorax.  Mildly improved aeration. Electronically Signed   By: Monte Fantasia M.D.   On: 07/16/2019 07:57    Assessment/Plan: Diagnosis: TBI with polytrauma  [place] score:  >VI  Speech to evaluate for Post traumatic amnesia and interval GOAT scores to assess progress.  NeuroPsych evaluation for behavorial assessment.  Provide environmental management by reducing the level of stimulation, tolerating restlessness when possible, protecting patient from harming self or others and reducing patient's cognitive confusion.  Address behavioral concerns include providing structured  environments and daily routines.  Cognitive therapy to direct modular abilities in order to maintain goals  including problem solving, self regulation/monitoring, self management, attention, and memory.  Fall precautions; pt at risk for second impact syndrome  Prevention of secondary injury: monitor for hypotension, hypoxia, seizures or signs of increased ICP  Prophylactic AED:   PT/OT consults for mobility strengthening, endurance training and adaptive ADLs   Avoid medications that could impair cognitive abilities, such as anticholinergics, antihistaminic, benzodiazapines, narcotics, etc when possible Labs and images (see above) independently reviewed.  Records reviewed and summated above.  1. Does the need for close, 24 hr/day medical supervision in concert with the patient's rehab needs make it unreasonable for this patient to be served in a less intensive setting? Yes  2. Co-Morbidities requiring supervision/potential complications: BPH, CAD, HTN (monitor and  provide prns in accordance with increased physical exertion and pain), AKI on CKD III (repeat labs, avoid nephrotoxic meds), prediabetes (Monitor in accordance with exercise and adjust meds as necessary), prior falls, ABLA (repeat labs, consider transfusion if necessary to ensure appropriate perfusion for increased activity tolerance), thrombocytopenia (repeat labs) 3. Due to safety, disease management, medication administration and patient education, does the patient require 24 hr/day rehab nursing? Yes 4. Does the patient require coordinated care of a physician, rehab nurse, therapy disciplines of PT/OT/SLP to address physical and functional deficits in the context of the above medical diagnosis(es)? Yes Addressing deficits in the following areas: balance, endurance, locomotion, strength, transferring, bathing, dressing, toileting, cognition and psychosocial support 5. Can the patient actively participate in an intensive therapy program of  at least 3 hrs of therapy per day at least 5 days per week? Yes 6. The potential for patient to make measurable gains while on inpatient rehab is excellent 7. Anticipated functional outcomes upon discharge from inpatient rehab are supervision and min assist  with PT, supervision and min assist with OT, modified independent and supervision with SLP. 8. Estimated rehab length of stay to reach the above functional goals is: 15-18 days. 9. Anticipated discharge destination: Home 10. Overall Rehab/Functional Prognosis: good  RECOMMENDATIONS: This patient's condition is appropriate for continued rehabilitative care in the following setting: CIR Patient has agreed to participate in recommended program. Yes Note that insurance prior authorization may be required for reimbursement for recommended care.  Comment: Rehab Admissions Coordinator to follow up.  I have personally performed a face to face diagnostic evaluation, including, but not limited to relevant history and physical exam findings, of this patient and developed relevant assessment and plan.  Additionally, I have reviewed and concur with the physician assistant's documentation above.   Delice Lesch, MD, ABPMR Bary Leriche, PA-C 07/17/2019

## 2019-07-16 NOTE — Progress Notes (Signed)
Patient ID: [name], male   DOB: January 17, 1934, 86 y.o.   MRN: [id number] BP (!) 132/56 (BP Location: Right Arm)   Pulse 76   Temp 98.3 F (36.8 C) (Oral)   Resp 19   Ht 5\' 7"  (1.702 m)   Wt 99.8 kg   SpO2 95%   BMI 34.46 kg/m  Alert and oriented x 4, speech is clear and fluent Perrl, full eom Symmetric facies, tongue and uvula midline Remains in collar, since the ct is deemed insufficient, an mri should be done. Flexion and extension views are not reliable and are not part of cervical spine clearance at this time.  Doing well overall. Moving all extremities, mild weakness left dorsiflexors, plantar flexion good.

## 2019-07-16 NOTE — PMR Pre-admission (Signed)
PMR Admission Coordinator Pre-Admission Assessment  Patient: [name] is an 86 y.o., male MRN: [id number] DOB: 23-Feb-1933 Height: '5\' 7"'  (170.2 cm) Weight: 99.8 kg              Insurance Information HMO:     PPO: yes     PCP:      IPA:      80/20:      OTHER:  PRIMARY: HTA      Policy#: [id number]      Subscriber: patient CM Name: Crystal       Phone#: [phone]     Fax#: [phone]   Pre-Cert#: Josem Kaufmann [id number]      Employer:  Josem Kaufmann provided by Donella Stade for admit to CIR. Approval was given by Crystal after expedited appeal was overturned in the patient's favor. Auth approval number provided is [id number]. Pt is approved for 7 days. HTA has epic access. For questions (p): [phone] Benefits:  Phone #: [phone] 1     Name: Samule Dry ref # [id number] Eff. Date: 02/05/2014-02-05-2020     Deduct: no deductible ($0)      Out of Pocket Max: $3,400 ($10 met)      Life Max:   CIR: $295/day co-pay for days 1-6; $0/day for days 7-90      SNF: $50/day co-pay for days 1-20, $178/day co-pay for days 21-100; limited to 100 days/benefit period Outpatient: $15/visit co-pay; limited by medical necessity    Home Health: 100% coverage, 0% co-insurance, $0 co-pay; limited by medical necessity    DME: 80% coverage, 20% co-insurance     Providers:  SECONDARY: None      Policy#:       Phone#:   Development worker, community:       Phone#:   The Engineer, petroleum" for patients in Inpatient Rehabilitation Facilities with attached "Privacy Act [place] Records" was provided and verbally reviewed with: Patient and Family  Emergency Contact Information Contact Information    Name Relation Home Work [place], [place]   [phone]     Current Medical History  Patient Admitting Diagnosis: TBI with polytrauma  History of Present Illness: [name] is a 86 y.o. male with history of BPH, CAD, HTN, CKD III prediabetes, prior falls; who was admitted on  07/14/2019 after unwitnessed fall down some stairs with amnesia of events and had reports of headache, back and chest pain. Head CT personally reviewed, showing small left SAH.  Work-up revealed small volume SAH on left with scalp contusion, trace left PTX with left 1st-7th rib fractures, mild LUL contusion, 3.1 cm AAA, distal clavicle fracture, dislocation of left interphalangeal joint fourth digit and possible avulsion fracture of MCP joint with advanced degenerative changes first MCP.  Dr. Cyndy Freeze recommended conservative management with repeat CT head if neurological decline noted.  Cervical spine CT showed mild multilevel DDD. CT lumbar spine showed contusion to left flank and paraspinal musculature with multilevel DDD and mild to moderate canal stenosis at L3/L4. Ortho recommended short period of sling immobilization. LUE and left thumb closed reduced and splinted--to follow up with Dr. Doreatha Martin in 2 weeks. Hard collar in place pending flex/extension views. Is NWB in LUE. Therapy evaluations completed and patient noted to have unsteady gait with decreased activity tolerance, cognitive deficits with problems difficulty processing simple command, poor recall and decreased problem solving. Wife reports some issues with mild intermittent confusion and memory last year since isolation for pandemic, however improving since admission to the hospital. CIR  recommended due to functional decline.  Hospital course was complicated by hyperglycemia as as well as ABLA and AKI.  Patient with associated numbness as well.  Case discussed with insurance MD as well. Pt continues to have therapy needs. Pt has now been approved for CIR after expedited appeal process. Pt is to admit to CIR on 07/23/19.   Glasgow Coma Scale Score: 15  Past Medical History  Past Medical History:  Diagnosis Date  . BPH (benign prostatic hyperplasia)   . Coronary artery disease   . High cholesterol   . Hypertension   . MI (myocardial infarction)  ([place])   . Prediabetes     Family History  family history includes Aortic aneurysm in his brother; Colon cancer in his brother; Heart disease in his brother and father; Stomach cancer in his mother.  Prior Rehab/Hospitalizations:  Has the patient had prior rehab or hospitalizations prior to admission? No  Has the patient had major surgery during 100 days prior to admission? No  Current Medications   Current Facility-Administered Medications:  .  0.9 %  sodium chloride infusion, , Intravenous, Continuous, Georganna Skeans, MD, Last Rate: 10 mL/hr at 07/20/19 0934, Rate Change at 07/20/19 0934 .  acetaminophen (TYLENOL) tablet 1,000 mg, 1,000 mg, Oral, Q6H, Lovick, Ayesha N, MD, 1,000 mg at 07/23/19 0512 .  albuterol (PROVENTIL) (2.5 MG/3ML) 0.083% nebulizer solution 2.5 mg, 2.5 mg, Nebulization, Q4H PRN, Joselyn Glassman A, RPH .  bethanechol (URECHOLINE) tablet 25 mg, 25 mg, Oral, TID, Georganna Skeans, MD, 25 mg at 07/23/19 0910 .  Chlorhexidine Gluconate Cloth 2 % PADS 6 each, 6 each, Topical, Daily, Jesusita Oka, MD, 6 each at 07/22/19 0941 .  doxazosin (CARDURA) tablet 2 mg, 2 mg, Oral, Daily, Saverio Danker, PA-C, 2 mg at 07/23/19 0910 .  enoxaparin (LOVENOX) injection 30 mg, 30 mg, Subcutaneous, Q12H, Saverio Danker, PA-C, 30 mg at 07/23/19 0234 .  escitalopram (LEXAPRO) tablet 10 mg, 10 mg, Oral, Daily, Saverio Danker, PA-C, 10 mg at 07/23/19 0910 .  fluticasone (FLONASE) 50 MCG/ACT nasal spray 1 spray, 1 spray, Each Nare, Daily PRN, Saverio Danker, PA-C .  ibuprofen (ADVIL) tablet 400 mg, 400 mg, Oral, Q6H PRN, Georganna Skeans, MD, 400 mg at 07/23/19 0909 .  insulin aspart (novoLOG) injection 0-9 Units, 0-9 Units, Subcutaneous, TID WC, Saverio Danker, PA-C, 2 Units at 07/23/19 0912 .  lactated ringers infusion, , Intravenous, Continuous, Jesusita Oka, MD, Last Rate: 63 mL/hr at 07/22/19 0933, New Bag at 07/22/19 0933 .  lidocaine (LIDODERM) 5 % 1 patch, 1 patch, Transdermal,  Q24H, Jesusita Oka, MD, 1 patch at 07/23/19 0514 .  methocarbamol (ROBAXIN) tablet 1,000 mg, 1,000 mg, Oral, TID, Jesusita Oka, MD, 1,000 mg at 07/23/19 0910 .  ondansetron (ZOFRAN-ODT) disintegrating tablet 4 mg, 4 mg, Oral, Q6H PRN **OR** ondansetron (ZOFRAN) injection 4 mg, 4 mg, Intravenous, Q6H PRN, Rolm Bookbinder, MD, 4 mg at 07/15/19 1113 .  sodium phosphate (FLEET) 7-19 GM/118ML enema 1 enema, 1 enema, Rectal, Once, Georganna Skeans, MD  Patients Current Diet:  Diet Order            Diet Carb Modified Fluid consistency: Thin; Room service appropriate? 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 no AD, Independent PTA  Prior Functional Level Prior Function Level of Independence: Independent with assistive device(s) Comments: pt utilizes walking stick for community distances  Self Care: Did the patient need help bathing, dressing, using the toilet or eating?  Independent  Indoor Mobility: Did the patient need assistance with walking from room to room (with or without device)? Independent  Stairs: Did the patient need assistance with internal or external stairs (with or without device)? Independent  Functional Cognition: Did the patient need help planning regular tasks such as shopping or remembering to take medications? [place] / Equipment Home Equipment:  (walking  stick)  Prior Device Use: Indicate devices/aids used by the patient prior to current illness, exacerbation or injury? would use walking stick during walks in neighborhood  Current Functional Level Cognition  Arousal/Alertness: Awake/alert Overall Cognitive Status: Impaired/Different from baseline Current Attention Level: Sustained Orientation Level: Oriented X4 Following Commands: Follows one step commands consistently, Follows multi-step commands with increased time Safety/Judgement: Decreased awareness of safety, Decreased awareness of deficits General Comments: Pt more alert today with decreased confusion during session. Oriented x 4. Confusion more noted with fatigue. Attention: Sustained Sustained Attention: Impaired Sustained Attention Impairment: Verbal complex Memory: Impaired Memory Impairment: Decreased short term memory, Decreased recall of new information Decreased Short Term Memory: Verbal basic Awareness: Appears intact Problem Solving: Impaired Problem Solving Impairment: Verbal complex, Functional complex Executive Function: Writer: Impaired Organizing Impairment: Verbal complex Safety/Judgment: Appears intact    Extremity Assessment (includes Sensation/Coordination)  Upper Extremity Assessment: LUE deficits/detail LUE Deficits / Details: in sling with thumb spica splint; deferred ROM due to pain  LUE: Unable to fully assess due to pain LUE Coordination: decreased gross motor, decreased fine motor  Lower Extremity Assessment: Defer to PT evaluation LLE Deficits / Details: L weakness more significant with ecchymosis noted at L hip and ankle. Pain with WB through LLE. Grossly 3/5    ADLs  Overall ADL's : Needs assistance/impaired Grooming: Minimal assistance, Sitting, Oral care Upper Body Bathing: Maximal assistance, Sitting Lower Body Bathing: Total assistance, Sitting/lateral leans Upper Body Dressing : Maximal assistance, Sitting Lower Body  Dressing: Total assistance, Sitting/lateral leans Toilet Transfer: Moderate assistance, +2 for physical assistance, Stand-pivot, BSC Toilet Transfer Details (indicate cue type and reason): MOD A +2 d/t posterior lean during stand pivot transfer to [facility] to pts L side Toileting- Clothing Manipulation and Hygiene: Total assistance, Sit to/from stand Toileting - Clothing Manipulation Details (indicate cue type and reason): psoterior pericare in standing post BM Functional mobility during ADLs: Moderate assistance, +2 for physical assistance, +2 for safety/equipment General ADL Comments: pt continues to present with hypotension impacting pts progression towards functional mobility goals    Mobility  Overal bed mobility: Needs Assistance Bed Mobility: Supine to Sit Rolling: Mod assist Sidelying to sit: +2 for physical assistance, Max assist Supine to sit: Min assist, HOB elevated Sit to supine: Mod assist Sit to sidelying: +2 for physical assistance, Mod assist, Max assist General bed mobility comments: pt utilizing bed rail and requires PT assist to pivot hips toward edge of bed    Transfers  Overall transfer level: Needs assistance Equipment used: 1 person hand held  assist Transfers: Sit to/from Stand, Stand Pivot Transfers Sit to Stand: Min assist Stand pivot transfers: Min assist General transfer comment: pt requires significant cueing for technique including foot placement, hand placement, utilizing only RUE to push, and rocking to gain forward momentum    Ambulation / Gait / Stairs / Wheelchair Mobility  Ambulation/Gait Ambulation/Gait assistance: Herbalist (Feet): 80 Feet (additional trial of 10') Assistive device: IV Pole Gait Pattern/deviations: Step-to pattern, Wide base of support General Gait Details: pt with shortened step to gait pattern, PT providing cues to perform step to gait with L foot leading initially due to discomfort Gait velocity: reduced Gait  velocity interpretation: <1.8 ft/sec, indicate of risk for recurrent falls    Posture / Balance Dynamic Sitting Balance Sitting balance - Comments: supervision Balance Overall balance assessment: Needs assistance Sitting-balance support: No upper extremity supported, Feet supported Sitting balance-Leahy Scale: Good Sitting balance - Comments: supervision Standing balance support: Single extremity supported Standing balance-Leahy Scale: Fair Standing balance comment: minG with unilateral UE support of IV pole    Special needs/care consideration Skin: abrasion to posterior head, ecchymosis to back, flank (right, left, lower), skin tear to left elbow, Behavioral consideration: wife reports confusion at night/in AM  Designated visitor: wife [name])     Previous Environmental health practitioner (from acute therapy documentation) Living Arrangements: Spouse/significant other  Lives With: Family Available Help at Discharge: Family, Available 24 hours/day Type of Home: House Home Layout: Two level Alternate Level Stairs-Rails: Right Alternate Level Stairs-Number of Steps: 13 Home Access: Stairs to enter Entrance Stairs-Rails: Right, Left Entrance Stairs-Number of Steps: 5 Bathroom Shower/Tub: Chiropodist: Standard  Discharge Living Setting Plans for Discharge Living Setting: House, Lives with (comment) (wife and two grown daughters) Type of Home at Discharge: House Discharge Home Layout: Two level, Bed/bath upstairs Alternate Level Stairs-Rails: Can reach both (only halfway) Alternate Level Stairs-Number of Steps: 13 Discharge Home Access: Stairs to enter Entrance Stairs-Rails: Right, Left Entrance Stairs-Number of Steps: 5 Discharge Bathroom Shower/Tub: Tub/shower unit Discharge Bathroom Toilet: Standard Discharge Bathroom Accessibility: Yes (but not in master bath (in other hallway bathroom it may fit) How Accessible: Accessible via walker Does the patient have any problems  obtaining your medications?: No  Social/Family/Support Systems Patient Roles: Spouse, Parent, Other (Comment) (preacher) Contact Information: wife: [name] [phone] Anticipated Caregiver: [name] + two grown children can assist  Anticipated Caregiver's Contact Information: see above Ability/Limitations of Caregiver: supervision Caregiver Availability: 24/7 Discharge Plan Discussed with Primary Caregiver: Yes (with pt and [name]) Is Caregiver In Agreement with Plan?: Yes Does Caregiver/Family have Issues with Lodging/Transportation while Pt is in Rehab?: No   Goals Patient/Family Goal for Rehab: PT/OT: Mod I/Supervision; SLP: Supervision Expected length of stay: 10-14 days Cultural Considerations: NA Pt/Family Agrees to Admission and willing to participate: Yes Program Orientation Provided & Reviewed with Pt/Caregiver Including Roles  & Responsibilities: Yes (pt and his wife)  Barriers to Discharge: Home environment access/layout, Weight bearing restrictions  Barriers to Discharge Comments: stairs to enter house; stairs to access bedroom/bathroom; tight master bathroom for RW.    Decrease burden of Care through IP rehab admission: NA   Possible need for SNF placement upon discharge:Not anticipated; pt has great social support from his family and has a good prognosis for further gains through CIR.    Patient Condition: This patient's medical and functional status has changed since the consult dated: 07/17/19 in which the Rehabilitation Physician determined and documented that the patient's condition is appropriate for intensive  rehabilitative care in an inpatient rehabilitation facility. See "History of Present Illness" (above) for medical update. Functional changes are: improvement in bed mobility from Max A to Min A, improvement in tranfers from [place] A/Mod A to Min A, improvement in gait from deferred due to pain and fatigue to Min A for 80 feetp; pt continues to require extensive assist  for ADLs. Patient's medical and functional status update has been discussed with the Rehabilitation physician and patient remains appropriate for inpatient rehabilitation. Will admit to inpatient rehab today.  Preadmission Screen Completed By:  Raechel Ache, OT, 07/23/2019 11:18 AM ______________________________________________________________________   Discussed status with Dr. Ranell Patrick on 6/27/21at 11:18AM and received approval for admission today.  Admission Coordinator:  Raechel Ache, time 11:18AM/Date 07/23/19.

## 2019-07-16 NOTE — Progress Notes (Signed)
Orthopedic Tech Progress Note Patient Details:  [name] 1933-07-12 [id number]  Ortho Devices Type of Ortho Device: Ankle Air splint Splint Material: Fiberglass Ortho Device/Splint Location: Lower Left Extremity Ortho Device/Splint Interventions: Ordered, Application   Post Interventions Patient Tolerated: Well Instructions Provided: Adjustment of device, Care of device, Poper ambulation with device   Ariq Khamis P Lorel Monaco 07/16/2019, 4:56 PM

## 2019-07-16 NOTE — Progress Notes (Signed)
Inpatient Rehabilitation-Admissions Coordinator   Inpatient Rehab Admissions:  Inpatient Rehab Consult received.  I met with pt and his wife at the bedside for rehabilitation assessment and to discuss goals and expectations of an inpatient rehab admission. Feel pt is a great candidate for CIR at this time given his diagnosis, PLOF, current functional level, and support at home. We discussed program details, expectations, anticipated LOS and expected functional outcomes. Pt and his wife would like to pursue this program. [facility] will begin insurance authorization process for possible admit. Will follow up once there has been a determination.   Raechel Ache, OTR/L  Rehab Admissions Coordinator  [phone] 07/16/2019 3:06 PM

## 2019-07-16 NOTE — TOC Initial Note (Signed)
Transition of Care [facility]) - Initial/Assessment Note    Patient Details  Name: [name] MRN: [id number] Date of Birth: Aug 01, 1933  Transition of Care [facility]) CM/SW Contact:    Emeterio Reeve, [place] Phone Number: 07/16/2019, 12:50 PM  Clinical Narrative:                  86 y.o. male with prior MI, HTN presents after unwitnessed fall down stairs. Pt has a history of falls. Pt found to have L distal clavicle fx and L thumb fx, L posterior ribs 1-7 fx, and small SAH.  CSW met with pt at bedside. CSW introduced self and explained her role at the hospital. Pts wife was also at bedside. Pt stated that PTA pt was independently walking and completing all ADL's. Pt states he lives in a two story home and the bedrooms are on the second floor.   CSW reviewed pt/ot reccs of CIR. Pt and wife stated they would be open to CIR if that will give him the best chance for a full recovery. Pts wife states he will return home after discharge and she will be able to care from him.   CSW will sign off at this time.   Expected Discharge Plan: IP Rehab Facility Barriers to Discharge: Continued Medical Work up   Patient Goals and CMS Choice Patient states their goals for this hospitalization and ongoing recovery are:: return home      Expected Discharge Plan and Services Expected Discharge Plan: [place]       Living arrangements for the past 2 months: Single Family Home                                      Prior Living Arrangements/Services Living arrangements for the past 2 months: Single Family Home Lives with:: Spouse Patient language and need for interpreter reviewed:: Yes Do you feel safe going back to the place where you live?: Yes      Need for Family Participation in Patient Care: Yes (Comment) Care giver support system in place?: Yes (comment)   Criminal Activity/Legal Involvement Pertinent to Current Situation/Hospitalization: No - Comment as  needed  Activities of Daily Living      Permission Sought/Granted Permission sought to share information with : Family Supports                Emotional Assessment Appearance:: Appears stated age Attitude/Demeanor/Rapport: Engaged Affect (typically observed): Appropriate, Accepting, Pleasant Orientation: : Oriented to Self, Oriented to Place, Oriented to  Time, Oriented to Situation Alcohol / Substance Use: Not Applicable Psych Involvement: No (comment)  Admission diagnosis:  Neck pain [M54.2] Trauma [T14.90XA] SDH (subdural hematoma) ([place]) [S06.5X9A] Fall [W19.XXXA] Pneumothorax [J93.9] Injury of head, initial encounter [S09.90XA] Dislocation of left thumb, initial encounter [T62.563S] Closed fracture of multiple ribs of left side, initial encounter [S22.42XA] Closed nondisplaced fracture of acromial end of left clavicle, initial encounter [S42.035A] Patient Active Problem List   Diagnosis Date Noted  . Fall 07/14/2019   PCP:  Patient, No Pcp Per Pharmacy:   [id number] [address] [id number]: [phone]: [phone]    Social Determinants of Health (SDOH) Interventions    Readmission Risk Interventions No flowsheet data found.  MEmeterio Reeve LLatanya Presser LEdgefieldSocial Worker [phone]

## 2019-07-16 NOTE — Progress Notes (Signed)
Rehab Admissions Coordinator Note:  Patient was screened by Cleatrice Burke for appropriateness for an Inpatient Acute Rehab Consult per PT recs.  At this time, we are recommending Inpatient Rehab consult. I will place order per protocol.  Cleatrice Burke RN MSN 07/16/2019, 11:09 AM  I can be reached at [phone].

## 2019-07-17 ENCOUNTER — Inpatient Hospital Stay (HOSPITAL_COMMUNITY): Payer: PPO

## 2019-07-17 DIAGNOSIS — I10 Essential (primary) hypertension: Secondary | ICD-10-CM

## 2019-07-17 DIAGNOSIS — D62 Acute posthemorrhagic anemia: Secondary | ICD-10-CM

## 2019-07-17 DIAGNOSIS — R7303 Prediabetes: Secondary | ICD-10-CM

## 2019-07-17 DIAGNOSIS — T07XXXA Unspecified multiple injuries, initial encounter: Secondary | ICD-10-CM

## 2019-07-17 DIAGNOSIS — N400 Enlarged prostate without lower urinary tract symptoms: Secondary | ICD-10-CM

## 2019-07-17 DIAGNOSIS — D696 Thrombocytopenia, unspecified: Secondary | ICD-10-CM

## 2019-07-17 DIAGNOSIS — S069X9A Unspecified intracranial injury with loss of consciousness of unspecified duration, initial encounter: Secondary | ICD-10-CM

## 2019-07-17 DIAGNOSIS — N1832 Chronic kidney disease, stage 3b: Secondary | ICD-10-CM

## 2019-07-17 DIAGNOSIS — N179 Acute kidney failure, unspecified: Secondary | ICD-10-CM

## 2019-07-17 LAB — BASIC METABOLIC PANEL
Anion gap: 7 (ref 5–15)
BUN: 41 mg/dL — ABNORMAL HIGH (ref 8–23)
CO2: 25 mmol/L (ref 22–32)
Calcium: 8.3 mg/dL — ABNORMAL LOW (ref 8.9–10.3)
Chloride: 105 mmol/L (ref 98–111)
Creatinine, Ser: 2.2 mg/dL — ABNORMAL HIGH (ref 0.61–1.24)
GFR calc Af Amer: 31 mL/min — ABNORMAL LOW (ref >60)
GFR calc non Af Amer: 27 mL/min — ABNORMAL LOW (ref >60)
Glucose, Bld: 123 mg/dL — ABNORMAL HIGH (ref 70–99)
Potassium: 4.7 mmol/L (ref 3.5–5.1)
Sodium: 137 mmol/L (ref 135–145)

## 2019-07-17 LAB — CBC
HCT: 25.9 % — ABNORMAL LOW (ref 39.0–52.0)
Hemoglobin: 8.4 g/dL — ABNORMAL LOW (ref 13.0–17.0)
MCH: 32.3 pg (ref 26.0–34.0)
MCHC: 32.4 g/dL (ref 30.0–36.0)
MCV: 99.6 fL (ref 80.0–100.0)
Platelets: 146 10*3/uL — ABNORMAL LOW (ref 150–400)
RBC: 2.6 MIL/uL — ABNORMAL LOW (ref 4.22–5.81)
RDW: 12.9 % (ref 11.5–15.5)
WBC: 7.3 10*3/uL (ref 4.0–10.5)
nRBC: 0 % (ref 0.0–0.2)

## 2019-07-17 LAB — GLUCOSE, CAPILLARY
Glucose-Capillary: 127 mg/dL — ABNORMAL HIGH (ref 70–99)
Glucose-Capillary: 143 mg/dL — ABNORMAL HIGH (ref 70–99)
Glucose-Capillary: 145 mg/dL — ABNORMAL HIGH (ref 70–99)
Glucose-Capillary: 122 mg/dL — ABNORMAL HIGH (ref 70–99)

## 2019-07-17 IMAGING — MR MR CERVICAL SPINE W/O CM
5 of 6 series · 25 of 48 positions shown · non-contrast
Comparison: CT of the cervical spine [DATE]

CLINICAL DATA: Neck trauma with midline tenderness.

EXAM:
MRI CERVICAL SPINE WITHOUT CONTRAST
TECHNIQUE: Multiplanar, multisequence MR imaging of the cervical spine was
performed. No intravenous contrast was administered.

[Series 5: T2 · sagittal · 3.0mm · 0.69mm/px · 3 of 17 slices shown (1 of 2)]
[im 1/17]
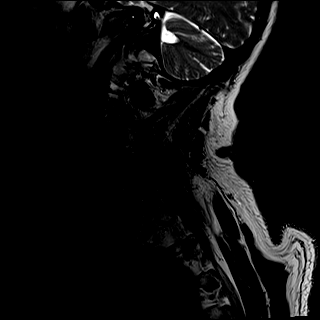
[im 9/17]
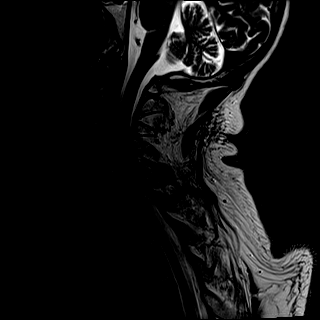
[im 17/17]
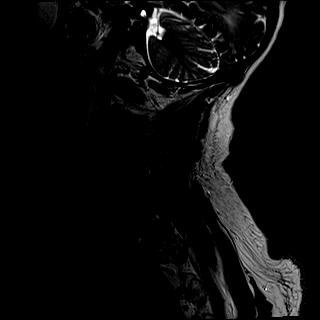

[Series 6: T1 · sagittal · 3.0mm · 0.69mm/px · 3 of 17 slices shown]
[im 1/17]
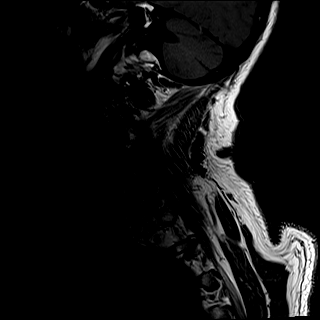
[im 9/17]
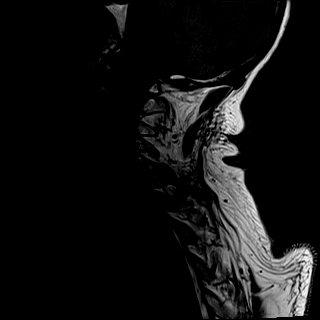
[im 17/17]
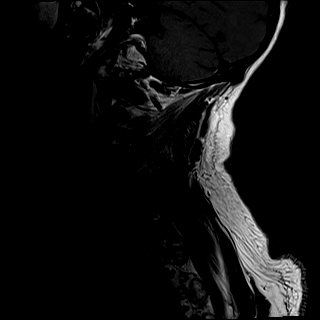

[Series 7: STIR · sagittal · 3.0mm · 0.86mm/px · 3 of 17 slices shown]
[im 1/17]
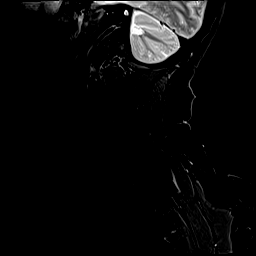
[im 9/17]
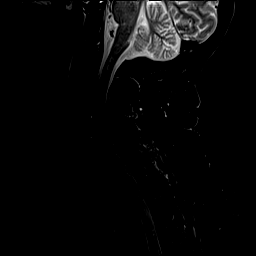
[im 17/17]
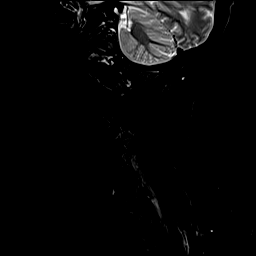

[Series 8: T2 · axial · 3.0mm · 0.66mm/px · z∈[-173,-61]mm · 8 of 40 slices shown (2 of 2)]
[im 1/40]
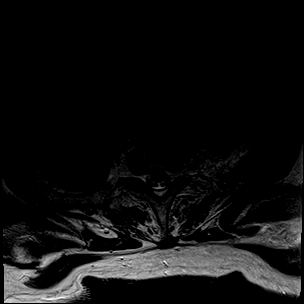
[im 6/40]
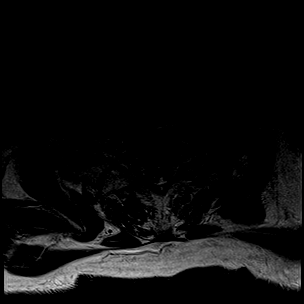
[im 12/40]
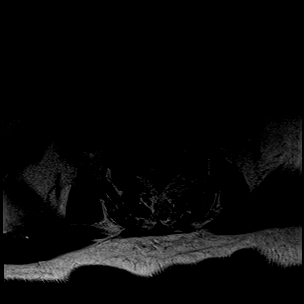
[im 17/40]
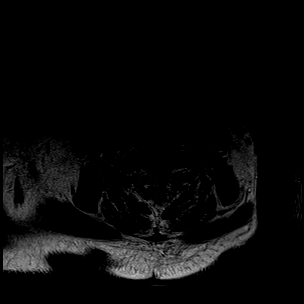
[im 23/40]
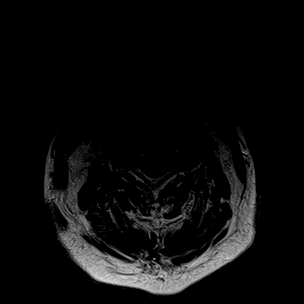
[im 28/40]
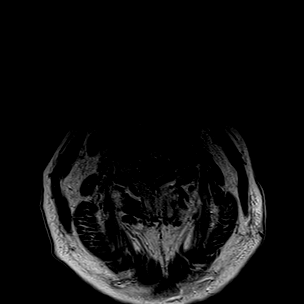
[im 34/40]
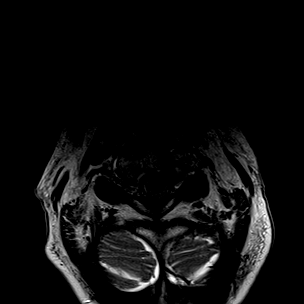
[im 40/40]
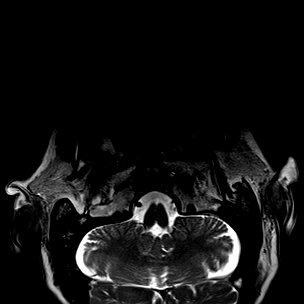

[Series 9: GRE · axial · 3.0mm · 0.39mm/px · z∈[-173,-61]mm · 8 of 40 slices shown]
[im 1/40]
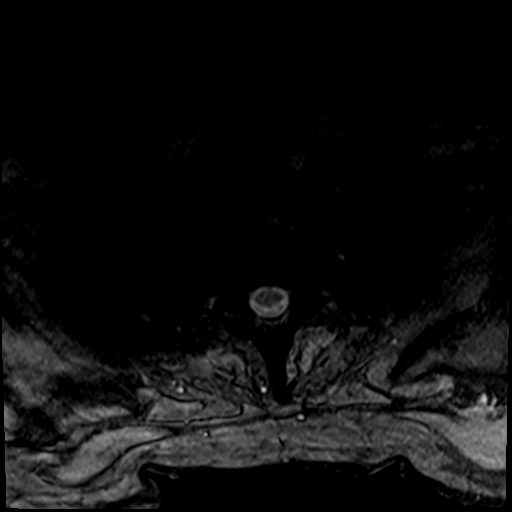
[im 6/40]
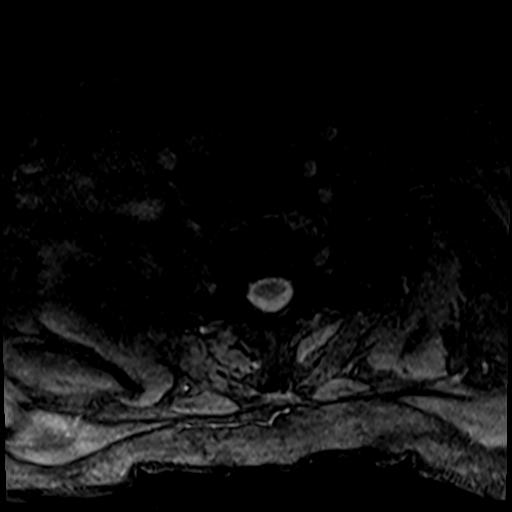
[im 12/40]
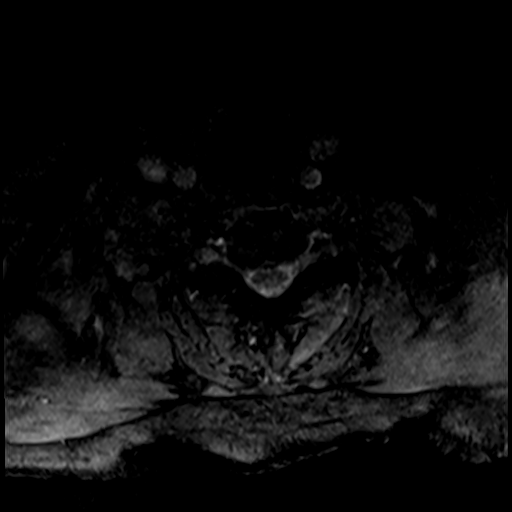
[im 17/40]
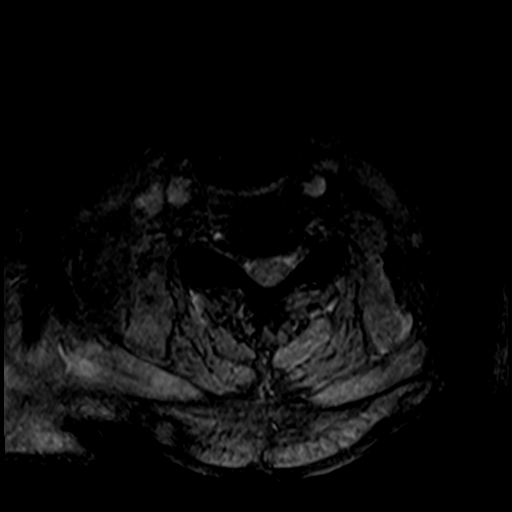
[im 23/40]
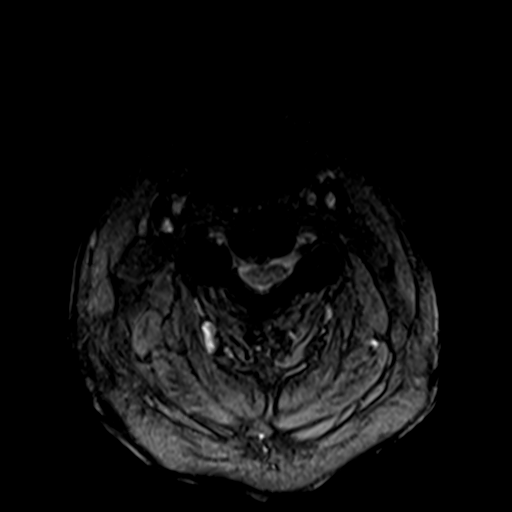
[im 28/40]
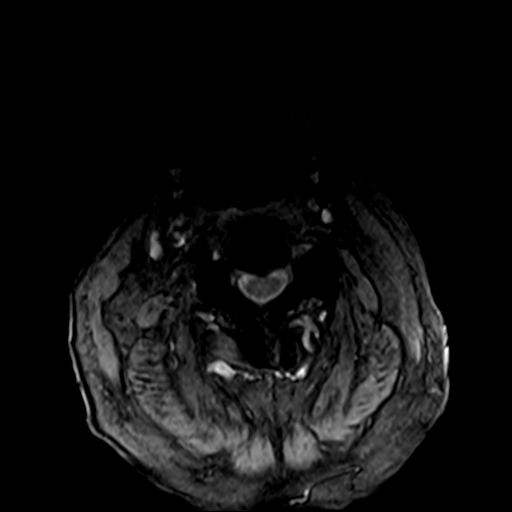
[im 34/40]
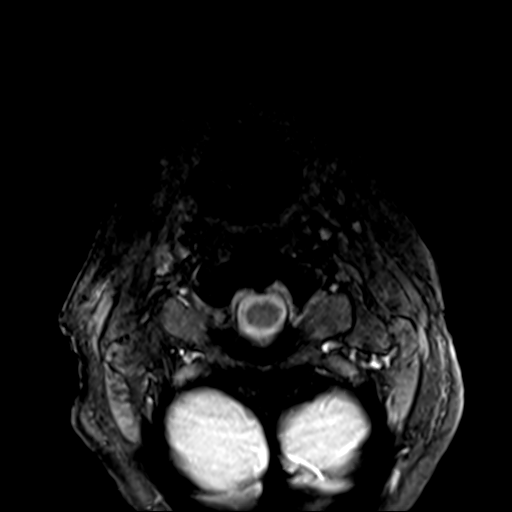
[im 40/40]
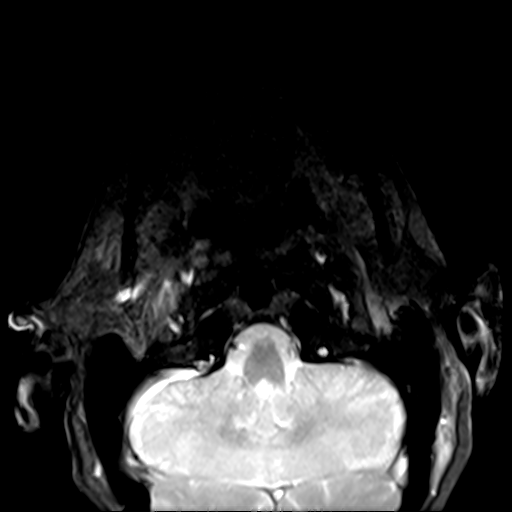

[25 of 48 positions shown; findings below may reference images not displayed]

FINDINGS: Alignment: Physiologic.

Vertebrae: No fracture, evidence of discitis, or bone lesion.

Cord: Normal signal and morphology.

Posterior Fossa, vertebral arteries, paraspinal tissues: Absent flow
void within the distal V2 segment of the left vertebral artery, may
represent slow flow versus occlusion.

Disc levels:

C2-3: Small posterior disc protrusion causing minimal indentation
thecal sac without significant spinal canal stenosis. Uncovertebral
and facet degenerative changes resulting in mild to moderate left
neural foraminal narrowing.

C3-4: Posterior disc protrusion causing mild spinal canal stenosis.
Uncovertebral and facet degenerative changes resulting in mild right
and moderate left neural foraminal narrowing.

C4-5: Posterior disc osteophyte complex resulting in mild spinal
canal stenosis. Uncovertebral and facet degenerative changes
resulting in mild-to-moderate right and mild left neural foraminal
narrowing.

C5-6: Small posterior disc protrusion causing mild spinal canal
stenosis. Uncovertebral and facet degenerative changes resulting in
moderate right neural foraminal narrowing.

C6-7: Posterior disc protrusion causing small indentation of the
thecal sac without significant spinal canal stenosis. Uncovertebral
and facet degenerative changes result in moderate right neural
foraminal narrowing.

C7-T1: No spinal canal or neural foraminal stenosis.
IMPRESSION: 1. No acute osseous abnormality or abnormal cord signal.
2. Cervical spondylosis with multilevel posterior disc protrusions
resulting in mild spinal canal stenosis from C3-4 through C5-6.
3. Moderate neural foraminal narrowing on the left at C2-3 and C3-4
and on the right at C5-6 and C6-7.
4. Absent flow void within the distal V2 segment of the left
vertebral artery, may represent slow flow versus occlusion.

## 2019-07-17 MED ORDER — SODIUM CHLORIDE 0.9 % IV BOLUS
1000.0000 mL | Freq: Once | INTRAVENOUS | Status: AC
  Administered 2019-07-17: 1000 mL via INTRAVENOUS

## 2019-07-17 MED ORDER — SODIUM CHLORIDE 0.9 % IV SOLN: INTRAVENOUS | Status: DC

## 2019-07-17 NOTE — Progress Notes (Addendum)
Inpatient Rehabilitation-Admissions Coordinator   [employer] requested a peer to peer after initial denial this afternoon. Unfortunately, despite peer to peer completion by PM&R MD, Dr. Posey Pronto, the patient's request for CIR was officially denied. Shared denial letter with family per their request which discussed appeal options. Reviewed appeal details. Pt's wife would like to initiate an expedited appeal. Will assist with this process on Monday.   Raechel Ache, OTR/L  Rehab Admissions Coordinator  [phone] 07/17/2019 6:14 PM

## 2019-07-17 NOTE — Progress Notes (Signed)
Patient ID: [name], male   DOB: 02-01-1934, 86 y.o.   MRN: [id number] BP (!) 98/41 (BP Location: Right Arm)   Pulse 80   Temp 98 F (36.7 C) (Oral)   Resp 12   Ht 5\' 7"  (1.702 m)   Wt 99.8 kg   SpO2 97%   BMI 34.46 kg/m  MRI shows no ligamentous injuries. He has spinal stenosis, no cord signal.  Doing well

## 2019-07-17 NOTE — Progress Notes (Signed)
SLP Cancellation Note  Patient Details Name: [name] MRN: [id number] DOB: Aug 25, 1933   Cancelled treatment:       Reason Eval/Treat Not Completed: Other (comment) Pt working with PT. Will f/u as able.     Osie Bond., M.A. [facility] Pager [phone] Office [phone]  07/17/2019, 2:21 PM

## 2019-07-17 NOTE — Progress Notes (Signed)
Physical Therapy Treatment Patient Details Name: [name] MRN: [id number] DOB: July 17, 1933 Today's Date: 07/17/2019    History of Present Illness 86 y.o. male with prior MI, HTN presents after unwitnessed fall down stairs. Pt has a history of falls. Pt found to have L distal clavicle fx and L thumb fx, L posterior ribs 1-7 fx, and small SAH.    PT Comments    Pt limited by hypotension during session although tolerating increased mobility compared to previous session. Pt continues to require physical assistance for all functional mobility at this time. Pt is generally weak and has a preference to reduce WB through LLE 2/2 pain. Pt remains at a high falls risk due to weakness, imbalance, and safety awareness deficits. PT continues to recommend CIR at this time as the pt was independent prior to admission and demonstrates the potential to return to a modI level. Pt will continue to benefit from acute PT services and aggressive mobilization from staff to improve mobility quality. Pt may benefit from bed in chair positioning to assist with management of hypotension with mobility.   Follow Up Recommendations  CIR     Equipment Recommendations  Wheelchair (measurements PT);Wheelchair cushion (measurements PT)    Recommendations for Other Services       Precautions / Restrictions Precautions Precautions: Fall Required Braces or Orthoses: Splint/Cast;Sling Splint/Cast: thumb spica splint Restrictions Weight Bearing Restrictions: Yes LUE Weight Bearing: Non weight bearing Other Position/Activity Restrictions: LUE sling    Mobility  Bed Mobility Overal bed mobility: Needs Assistance Bed Mobility: Rolling;Sit to Sidelying;Supine to Sit Rolling: Max assist   Supine to sit: Max assist   Sit to sidelying: Max assist;+2 for physical assistance    Transfers Overall transfer level: Needs assistance Equipment used: 1 person hand held assist Transfers: Sit to/from [employer] Sit to Stand: Mod assist Stand pivot transfers: Mod assist       General transfer comment: pt performs 3 sit to stands and 2 SPT during session all with modA, PT cues for initiation and technique  Ambulation/Gait Ambulation/Gait assistance: Mod assist Gait Distance (Feet): 2 Feet Assistive device: 1 person hand held assist Gait Pattern/deviations: Shuffle Gait velocity: reduced Gait velocity interpretation: <1.31 ft/sec, indicative of household ambulator General Gait Details: short shuffling steps from bed to recliner and back, PT providing weight shift assistance to initiate mobility   Stairs             Wheelchair Mobility    Modified Rankin (Stroke Patients Only) Modified Rankin (Stroke Patients Only) Pre-Morbid Rankin Score: No symptoms Modified Rankin: Moderately severe disability     Balance Overall balance assessment: Needs assistance Sitting-balance support: Single extremity supported;Feet supported Sitting balance-Leahy Scale: Fair Sitting balance - Comments: minG at edge of bed with RUE support   Standing balance support: Single extremity supported Standing balance-Leahy Scale: Poor Standing balance comment: modA to maintain static standing balance, kyphotic posture                            Cognition Arousal/Alertness: Awake/alert Behavior During Therapy: Flat affect Overall Cognitive Status: Impaired/Different from baseline Area of Impairment: Attention;Memory;Following commands;Safety/judgement;Awareness;Problem solving                   Current Attention Level: Focused Memory: Decreased recall of precautions;Decreased short-term memory Following Commands: Follows one step commands with increased time Safety/Judgement: Decreased awareness of safety;Decreased awareness of deficits Awareness: Intellectual Problem Solving: Slow processing;Requires verbal  cues;Requires tactile cues;Difficulty sequencing General Comments:  pt speaking in multiple languages during session, PT unable to discern if the pt is reverting to old languages, pt is able to translate into english if asked. Pt requires increased time to respond to questions, report he feels as if he is being treated like a child despite PT attempts to make direct communication and explain medical benefits of mobilizing.      Exercises General Exercises - Lower Extremity Ankle Circles/Pumps: AROM;Both;10 reps Quad Sets: AROM;Both;10 reps Gluteal Sets: AROM;Both;5 reps    General Comments General comments (skin integrity, edema, etc.): pt limited by hypotension during session, BP of 91/41 prior to mobility, increases to 100/47 with sitting at edge of bed. Upon transfer to recliner pt BP drops to 76/35, PT elevates pts LEs and has pt perform exercise with increase in BP to 96/38, RN made aware. After resting in recliner PT retakes BP at 87/37. Pt returned to bed with BP of 95/47.       Pertinent Vitals/Pain Pain Assessment: Faces Faces Pain Scale: Hurts whole lot Pain Location: L hip, ankle, ribs Pain Descriptors / Indicators: Grimacing Pain Intervention(s): Monitored during session    Home Living                      Prior Function            PT Goals (current goals can now be found in the care plan section) Acute Rehab PT Goals Patient Stated Goal: To return to independent mobility Progress towards PT goals: Progressing toward goals    Frequency    Min 5X/week      PT Plan Current plan remains appropriate    Co-evaluation              AM-PAC PT "6 Clicks" Mobility   Outcome Measure  Help needed turning from your back to your side while in a flat bed without using bedrails?: Total Help needed moving from lying on your back to sitting on the side of a flat bed without using bedrails?: Total Help needed moving to and from a bed to a chair (including a wheelchair)?: A Lot Help needed standing up from a chair using your  arms (e.g., wheelchair or bedside chair)?: A Lot Help needed to walk in hospital room?: Total Help needed climbing 3-5 steps with a railing? : Total 6 Click Score: 8    End of Session   Activity Tolerance: Treatment limited secondary to medical complications (Comment) (hypotension) Patient left: in bed;with bed alarm set;with call bell/phone within reach Nurse Communication: Mobility status PT Visit Diagnosis: Other abnormalities of gait and mobility (R26.89);History of falling (Z91.81);Pain Pain - Right/Left: Left Pain - part of body: Shoulder;Hip;Ankle and joints of foot     Time: 2542-7062 PT Time Calculation (min) (ACUTE ONLY): 38 min  Charges:  $Therapeutic Exercise: 8-22 mins $Therapeutic Activity: 23-37 mins                     Zenaida Niece, PT, DPT Acute Rehabilitation Pager: [phone]    Zenaida Niece 07/17/2019, 2:34 PM

## 2019-07-17 NOTE — Progress Notes (Signed)
Patient ID: [name], male   DOB: 1933/06/12, 86 y.o.   MRN: [id number]       Subjective: Patient more tired today.  Ate some of his breakfast.  Has spoken several different languages overnight to his wife at the bedside with a little bit of confusion.  Otherwise, no new complaints this am.  ROS: See above, otherwise other systems negative  Objective: Vital signs in last 24 hours: Temp:  [97.7 F (36.5 C)-98.5 F (36.9 C)] 98.3 F (36.8 C) (06/11 0803) Pulse Rate:  [72-79] 79 (06/11 0803) Resp:  [11-19] 15 (06/11 0803) BP: (101-132)/(48-57) 107/55 (06/11 0803) SpO2:  [93 %-98 %] 93 % (06/11 0803) Last BM Date: 07/14/19  Intake/Output from previous day: 06/10 0701 - 06/11 0700 In: -  Out: 550 [Urine:550] Intake/Output this shift: No intake/output data recorded.  PE: Gen: awake and alert, but more tired than yesterday Neck: c-collar removed as MRI negative.   Heart: regular Lungs: CTAB, chest wall tenderness as expected Abd: soft, NT, except on left flank with ecchymosis MS: MAE, left hand in thumb spica splint, wiggles fingers Psych: A&Ox3, occasionally says some things that don't make a lot of sense  Lab Results:  Recent Labs    07/16/19 0808 07/17/19 0556  WBC 6.9 7.3  HGB 9.1* 8.4*  HCT 27.6* 25.9*  PLT 137* 146*   BMET Recent Labs    07/16/19 0808 07/17/19 0556  NA 136 137  K 4.7 4.7  CL 105 105  CO2 23 25  GLUCOSE 124* 123*  BUN 30* 41*  CREATININE 1.81* 2.20*  CALCIUM 8.3* 8.3*   PT/INR Recent Labs    07/14/19 2022  LABPROT 12.8  INR 1.0   CMP     Component Value Date/Time   NA 137 07/17/2019 0556   K 4.7 07/17/2019 0556   CL 105 07/17/2019 0556   CO2 25 07/17/2019 0556   GLUCOSE 123 (H) 07/17/2019 0556   BUN 41 (H) 07/17/2019 0556   CREATININE 2.20 (H) 07/17/2019 0556   CALCIUM 8.3 (L) 07/17/2019 0556   PROT 6.2 (L) 07/14/2019 2022   ALBUMIN 3.5 07/14/2019 2022   AST 39 07/14/2019 2022   ALT 27 07/14/2019 2022   ALKPHOS  71 07/14/2019 2022   BILITOT 0.7 07/14/2019 2022   GFRNONAA 27 (L) 07/17/2019 0556   GFRAA 31 (L) 07/17/2019 0556   Lipase  No results found for: LIPASE     Studies/Results: DG Ankle Complete Left  Result Date: 07/16/2019 CLINICAL DATA:  Lateral ankle pain and swelling after fall 2 days ago. EXAM: LEFT ANKLE COMPLETE - 3+ VIEW COMPARISON:  None. FINDINGS: No acute fracture or dislocation. The ankle mortise is symmetric. The talar dome is intact. No tibiotalar joint effusion. Joint spaces are preserved. Bone mineralization is normal. Diffuse soft tissue swelling. IMPRESSION: 1. Diffuse soft tissue swelling. No acute osseous abnormality. Electronically Signed   By: Titus Dubin M.D.   On: 07/16/2019 09:27   MR CERVICAL SPINE WO CONTRAST  Result Date: 07/17/2019 CLINICAL DATA:  Neck trauma with midline tenderness. EXAM: MRI CERVICAL SPINE WITHOUT CONTRAST TECHNIQUE: Multiplanar, multisequence MR imaging of the cervical spine was performed. No intravenous contrast was administered. COMPARISON:  CT of the cervical spine July 14, 2019 FINDINGS: Alignment: Physiologic. Vertebrae: No fracture, evidence of discitis, or bone lesion. Cord: Normal signal and morphology. Posterior Fossa, vertebral arteries, paraspinal tissues: Absent flow void within the distal V2 segment of the left vertebral artery, may represent slow flow versus  occlusion. Disc levels: C2-3: Small posterior disc protrusion causing minimal indentation thecal sac without significant spinal canal stenosis. Uncovertebral and facet degenerative changes resulting in mild to moderate left neural foraminal narrowing. C3-4: Posterior disc protrusion causing mild spinal canal stenosis. Uncovertebral and facet degenerative changes resulting in mild right and moderate left neural foraminal narrowing. C4-5: Posterior disc osteophyte complex resulting in mild spinal canal stenosis. Uncovertebral and facet degenerative changes resulting in  mild-to-moderate right and mild left neural foraminal narrowing. C5-6: Small posterior disc protrusion causing mild spinal canal stenosis. Uncovertebral and facet degenerative changes resulting in moderate right neural foraminal narrowing. C6-7: Posterior disc protrusion causing small indentation of the thecal sac without significant spinal canal stenosis. Uncovertebral and facet degenerative changes result in moderate right neural foraminal narrowing. C7-T1: No spinal canal or neural foraminal stenosis. IMPRESSION: 1. No acute osseous abnormality or abnormal cord signal. 2. Cervical spondylosis with multilevel posterior disc protrusions resulting in mild spinal canal stenosis from C3-4 through C5-6. 3. Moderate neural foraminal narrowing on the left at C2-3 and C3-4 and on the right at C5-6 and C6-7. 4. Absent flow void within the distal V2 segment of the left vertebral artery, may represent slow flow versus occlusion. Electronically Signed   By: Pedro Earls M.D.   On: 07/17/2019 08:42   DG Chest Port 1 View  Result Date: 07/16/2019 CLINICAL DATA:  Pneumothorax and left chest pain EXAM: PORTABLE CHEST 1 VIEW COMPARISON:  08/13/2019 FINDINGS: Cardiomegaly. Improved aeration at the left base. No visible pneumothorax. Known left rib fractures. IMPRESSION: No visible pneumothorax.  Mildly improved aeration. Electronically Signed   By: Monte Fantasia M.D.   On: 07/16/2019 07:57    Anti-infectives: Anti-infectives (From admission, onward)   None       Assessment/Plan Fall Distal clavicle fx- ortho recommends non op management with sling. Left thumb fx dislocation-orthohand recommends non op management. fx relocated and currently in thumb spica splint TBI/SAH-NSGY with no plans for repeat head scan unless he changes neurologically. slightly forgetfulness at times, but stable. TBI therapies ordered Left posterior 1-7 rib fxwith trace PTX- repeat CXR stable with no PTX. No SOB  today and sats good. Pain control. PT/OT for mobilization ABL anemia secondary to paraspinal muscle hematoma and abdominal wall contusion- cbc pending ? Perinephric contusion- UA negative for blood. Likely from above ARI- cr 2.20.  Will restart gentle hydration at 75cc/hr with NS.  Recheck BMET in am Neck Pain- appreciate NSGY input.  MRI c-spine negative.  Collar removed and can wear soft or hard collar prn pain HTN- resume home meds Prediabetes - hgbA1c of 6.5.  Carb mod diet and SSI as needed FEN- carb mod diet VTE- Lovenox 30mg  BID ID -none currently needed   LOS: 3 days    Henreitta Cea , [facility] 07/17/2019, 9:34 AM Please see Amion for pager number during day hours 7:00am-4:30pm or 7:00am -11:30am on weekends

## 2019-07-17 NOTE — Progress Notes (Signed)
   07/17/19 1614  Assess: MEWS Score  Temp (!) 97.4 F (36.3 C)  BP (!) 110/48  Pulse Rate 73  Resp 18  SpO2 100 %  O2 Device Room Air  Assess: MEWS Score  MEWS Temp 0  MEWS Systolic 0  MEWS Pulse 0  MEWS RR 0  MEWS LOC 0  MEWS Score 0  MEWS Score Color Green   Patient BP improved, patient denies pain with medications given scheduled and PRN. Patient ate >50% of dinner, alert and conversant with spouse.

## 2019-07-17 NOTE — Discharge Instructions (Signed)
Rib Fracture  A rib fracture is a break or crack in one of the bones of the ribs. The ribs are like a cage that goes around your upper chest. A broken or cracked rib is often painful, but most do not cause other problems. Most rib fractures usually heal on their own in 1-3 months. Follow these instructions at home: Managing pain, stiffness, and swelling  If directed, apply ice to the injured area. ? Put ice in a plastic bag. ? Place a towel between your skin and the bag. ? Leave the ice on for 20 minutes, 2-3 times a day.  Take over-the-counter and prescription medicines only as told by your doctor. Activity  Avoid activities that cause pain to the injured area. Protect your injured area.  Slowly increase activity as told by your doctor. General instructions  Do deep breathing as told by your doctor. You may be told to: ? Take deep breaths many times a day. ? Cough many times a day while hugging a pillow. ? Use a device (incentive spirometer) to do deep breathing many times a day.  Drink enough fluid to keep your pee (urine) clear or pale yellow.  Do not wear a rib belt or binder. These do not allow you to breathe deeply.  Keep all follow-up visits as told by your doctor. This is important. Contact a doctor if:  You have a fever. Get help right away if:  You have trouble breathing.  You are short of breath.  You cannot stop coughing.  You cough up thick or bloody spit (sputum).  You feel sick to your stomach (nauseous), throw up (vomit), or have belly (abdominal) pain.  Your pain gets worse and medicine does not help. Summary  A rib fracture is a break or crack in one of the bones of the ribs.  Apply ice to the injured area and take medicines for pain as told by your doctor.  Take deep breaths and cough many times a day. Hug a pillow every time you cough. This information is not intended to replace advice given to you by your health care provider. Make sure you  discuss any questions you have with your health care provider. Document Revised: 01/04/2017 Document Reviewed: 04/24/2016 Elsevier Patient Education  [place], Adult  A concussion is a brain injury from a hard, direct hit (trauma) to your head or body. This direct hit causes the brain to quickly shake back and forth inside the skull. A concussion may also be called a mild traumatic brain injury (TBI). Healing from this injury can take time. What are the causes? This condition is caused by:  A direct hit to your head, such as: ? Running into a player during a game. ? Being hit in a fight. ? Hitting your head on a hard surface.  A quick and sudden movement (jolt) of the head or neck, such as in a car crash. What are the signs or symptoms? The signs of a concussion can be hard to notice. They may be missed by you, family members, and doctors. You may look fine on the outside but may not act or feel normal. Physical symptoms  Headaches.  Being tired (fatigued).  Being dizzy.  Problems with body balance.  Problems seeing or hearing.  Being sensitive to light or noise.  Feeling sick to your stomach (nausea) or throwing up (vomiting).  Not sleeping or eating as you used to.  Loss of feeling (numbness) or  tingling in the body.  Seizure. Mental and emotional symptoms  Problems remembering things.  Trouble focusing your mind (concentrating), organizing, or making decisions.  Being slow to think, act, react, speak, or read.  Feeling grouchy (irritable).  Having mood changes.  Feeling worried or nervous (anxious).  Feeling sad (depressed). How is this treated? This condition may be treated by:  Stopping sports or activity if you are injured. If you hit your head or have signs of concussion: ? Do not return to sports or activities the same day. ? Get checked by a doctor before you return to your activities.  Resting your body and your  mind.  Being watched carefully, often at home.  Medicines to help with symptoms such as: ? Feeling sick to your stomach. ? Headaches. ? Problems with sleep.  Avoid taking strong pain medicines (opioids) for a concussion.  Avoiding alcohol and drugs.  Being asked to go to a concussion clinic or a place to help you recover (rehabilitation center). Recovery from a concussion can take time. Return to activities only:  When you are fully healed.  When your doctor says it is safe. Follow these instructions at home: Activity  Limit activities that need a lot of thought or focus, such as: ? Homework or work for your job. ? Watching TV. ? Using the computer or phone. ? Playing memory games and puzzles.  Rest. Rest helps your brain heal. Make sure you: ? Get plenty of sleep. Most adults should get 7-9 hours of sleep each night. ? Rest during the day. Take naps or breaks when you feel tired.  Avoid activity like exercise until your doctor says its safe. Stop any activity that makes symptoms worse.  Do not do activities that could cause a second concussion, such as riding a bike or playing sports.  Ask your doctor when you can return to your normal activities, such as school, work, sports, and driving. Your ability to react may be slower. Do not do these activities if you are dizzy. General instructions   Take over-the-counter and prescription medicines only as told by your doctor.  Do not drink alcohol until your doctor says you can.  Watch your symptoms and tell other people to do the same. Other problems can occur after a concussion. Older adults have a higher risk of serious problems.  Tell your work Freight forwarder, teachers, Government social research officer, school counselor, coach, or Product/process development scientist about your injury and symptoms. Tell them about what you can or cannot do.  Keep all follow-up visits as told by your doctor. This is important. How is this prevented?  It is very important that you  do not get another brain injury. In rare cases, another injury can cause brain damage that will not go away, brain swelling, or death. The risk of this is greatest in the first 7-10 days after a head injury. To avoid injuries: ? Stop activities that could lead to a second concussion, such as contact sports, until your doctor says it is okay. ? When you return to sports or activities:  Do not crash into other players. This is how most concussions happen.  Follow the rules.  Respect other players. ? Get regular exercise. Do strength and balance training. ? Wear a helmet that fits you well during sports, biking, or other activities.  Helmets can help protect you from serious skull and brain injuries, but they do not protect you from a concussion. Even when wearing a helmet, you should avoid being  hit in the head. Contact a doctor if:  Your symptoms get worse or they do not get better.  You have new symptoms.  You have another injury. Get help right away if:  You have bad headaches or your headaches get worse.  You feel weak or numb in any part of your body.  You are mixed up (confused).  Your balance gets worse.  You keep throwing up.  You feel more sleepy than normal.  Your speech is not clear (is slurred).  You cannot recognize people or places.  You have a seizure.  Others have trouble waking you up.  You have behavior changes.  You have changes in how you see (vision).  You pass out (lose consciousness). Summary  A concussion is a brain injury from a hard, direct hit (trauma) to your head or body.  This condition is treated with rest and careful watching of symptoms.  If you keep having symptoms, call your doctor. This information is not intended to replace advice given to you by your health care provider. Make sure you discuss any questions you have with your health care provider. Document Revised: 09/12/2017 Document Reviewed: 09/12/2017 Elsevier Patient  Education  [place].   Ankle Sprain  An ankle sprain is a stretch or tear in one of the tough tissues (ligaments) that connect the bones in your ankle. An ankle sprain can happen when the ankle rolls outward (inversion sprain) or inward (eversion sprain). What are the causes? This condition is caused by rolling or twisting the ankle. What increases the risk? You are more likely to develop this condition if you play sports. What are the signs or symptoms? Symptoms of this condition include:  Pain in your ankle.  Swelling.  Bruising. This may happen right after you sprain your ankle or 1-2 days later.  Trouble standing or walking. How is this diagnosed? This condition is diagnosed with:  A physical exam. During the exam, your doctor will press on certain parts of your foot and ankle and try to move them in certain ways.  X-ray imaging. These may be taken to see how bad the sprain is and to check for broken bones. How is this treated? This condition may be treated with:  A brace or splint. This is used to keep the ankle from moving until it heals.  An elastic bandage. This is used to support the ankle.  Crutches.  Pain medicine.  Surgery. This may be needed if the sprain is very bad.  Physical therapy. This may help to improve movement in the ankle. Follow these instructions at home: If you have a brace or a splint:  Wear the brace or splint as told by your doctor. Remove it only as told by your doctor.  Loosen the brace or splint if your toes: ? Tingle. ? Lose feeling (become numb). ? Turn cold and blue.  Keep the brace or splint clean.  If the brace or splint is not waterproof: ? Do not let it get wet. ? Cover it with a watertight covering when you take a bath or a shower. If you have an elastic bandage (dressing):  Remove it to shower or bathe.  Try not to move your ankle much, but wiggle your toes from time to time. This helps to prevent  swelling.  Adjust the dressing if it feels too tight.  Loosen the dressing if your foot: ? Loses feeling. ? Tingles. ? Becomes cold and blue. Managing pain, stiffness, and  swelling   Take over-the-counter and prescription medicines only as told by doctor.  For 2-3 days, keep your ankle raised (elevated) above the level of your heart.  If told, put ice on the injured area: ? If you have a removable brace or splint, remove it as told by your doctor. ? Put ice in a plastic bag. ? Place a towel between your skin and the bag. ? Leave the ice on for 20 minutes, 2-3 times a day. General instructions  Rest your ankle.  Do not use your injured leg to support your body weight until your doctor says that you can. Use crutches as told by your doctor.  Do not use any products that contain nicotine or tobacco, such as cigarettes, e-cigarettes, and chewing tobacco. If you need help quitting, ask your doctor.  Keep all follow-up visits as told by your doctor. Contact a doctor if:  Your bruises or swelling are quickly getting worse.  Your pain does not get better after you take medicine. Get help right away if:  You cannot feel your toes or foot.  Your foot or toes look blue.  You have very bad pain that gets worse. Summary  An ankle sprain is a stretch or tear in one of the tough tissues (ligaments) that connect the bones in your ankle.  This condition is caused by rolling or twisting the ankle.  Symptoms include pain, swelling, bruising, and trouble walking.  To help with pain and swelling, put ice on the injured ankle, raise your ankle above the level of your heart, and use an elastic bandage. Also, rest as told by your doctor.  Keep all follow-up visits as told by your doctor. This is important. This information is not intended to replace advice given to you by your health care provider. Make sure you discuss any questions you have with your health care provider. Document  Revised: 06/18/2017 Document Reviewed: 06/18/2017 Elsevier Patient Education  [place].   Finger or Thumb Dislocation Finger or thumb dislocation happens when the bones in a joint move out of their normal positions. Dislocations may occur in any joint in the fingers or thumb. Finger or thumb dislocation is a common and serious injury. What are the causes? This condition is caused by:  A forceful impact or injury to the hand.  A finger or thumb bending the wrong way (hyperextension). What increases the risk?  Having injured your hand sometime in the past.  Doing the same motions over and over with your hands (repetitive motions), such as when playing sports or doing heavy labor.  Having poor strength and ease of movement (flexibility) in your hand. What are the signs or symptoms?  The injured joint may look like it is out of place or at an odd angle (deformed).  The injured area having: ? Pain. ? Swelling. ? Bruising.  Not being able to move your finger or thumb the way that you normally would (limited range of motion). How is this treated? A mild dislocation is treated by moving your finger or thumb back into place (reduction). Your doctor may do this:  By hand (manually).  With surgery. You may need surgery if you have:  A very bad dislocation.  A dislocation that cannot be put back in place by hand.  A broken (fractured) bone.  An open wound. After your finger or thumb is put back into place, a splint may be put on for up to 6 weeks. This will keep your  finger or thumb from moving (immobilized). You may also need to:  Do exercises to help you regain use of your hand (physical therapy).  Go to a doctor who has special training in bone disorders (orthopedist) to help treat your condition. Follow these instructions at home: If you have a splint:  Do not put pressure on any part of the splint until it is fully hardened. This may take several hours.  Wear  the splint as told by your doctor. Remove it only as told by your doctor.  Loosen the splint if your fingers: ? Tingle. ? Become numb. ? Turn cold and blue.  Keep the splint clean.  If the splint is not waterproof: ? Do not let it get wet. ? Cover it with a watertight covering when you take a bath or shower. Managing pain, stiffness, and swelling   If told, put ice on your hand. ? If you have a removable splint, remove it as told by your doctor. ? Put ice in a plastic bag. ? Place a towel between your skin and the bag. ? Leave the ice on for 20 minutes, 2-3 times a day.  Move your fingers often.  Raise (elevate) the injured area above the level of your heart while you are sitting or lying down. Activity  Return to your normal activities as told by your doctor. Ask your doctor what activities are safe for you.  Rest and limit your hand movement as told by your doctor.  Do exercises as told by your doctor. Driving  Ask your doctor if the medicine prescribed to you requires you to avoid driving or using heavy machinery.  Ask your doctor when it is safe to drive if you have a splint on your hand. General instructions  Take over-the-counter and prescription medicines only as told by your doctor.  Do not take baths, swim, or use a hot tub until your doctor approves. Ask your doctor if you may take showers. You may only be allowed to take sponge baths.  Do not use any products that contain nicotine or tobacco, such as cigarettes, e-cigarettes, and chewing tobacco. These can delay bone healing. If you need help quitting, ask your doctor.  Keep all follow-up visits as told by your doctor. This is important. Contact a doctor if:  You have problems with your splint.  You have pain that gets worse or does not get better with medicine.  You have bruising, swelling, or redness that gets worse.  You have trouble moving your finger or thumb after it heals. Get help right away  if:  You lose feeling in your finger or thumb.  You cannot move your finger or thumb.  Your finger or thumb is pale or cold.  You have very bad pain. Summary  Finger or thumb dislocation happens when the bones in a joint move out of their normal positions.  This condition is caused by a forceful impact or injury to the hand. It may also be caused by a finger or thumb bending the wrong way.  For mild dislocations, this condition is treated by moving your finger or thumb back into position.  You may need surgery to treat this condition. This information is not intended to replace advice given to you by your health care provider. Make sure you discuss any questions you have with your health care provider. Document Revised: 01/15/2018 Document Reviewed: 01/15/2018 Elsevier Patient Education  .   Clavicle Fracture  A clavicle fracture is  a broken collarbone. The collarbone is the long bone that connects your shoulder to your chest wall. A broken collarbone may be treated with a sling or with surgery. Treatment depends on whether the broken ends of the bone are out of place or not. Follow these instructions at home: If you have a sling:  Wear the sling as told by your doctor. Take it off only as told by your doctor.  Loosen the sling if your fingers tingle, become numb, or turn cold and blue.  Do not lift your arm. Keep it across your chest.  Keep the sling clean.  Ask your doctor if you may take off the sling for bathing. ? If your sling is not waterproof, do not let it get wet. Cover the sling with a watertight covering if you take a bath or a shower while wearing it. ? If you may take off your sling when you take a bath or a shower, keep your shoulder in the same position as when the sling is on. Managing pain, stiffness, and swelling   If told, put ice on the injured area: ? If you have a removable sling, take it off as told by your doctor. ? Put ice in a  plastic bag. ? Place a towel between your skin and the bag. ? Leave the ice on for 20 minutes, 2-3 times a day. Activity  Avoid activities that make your symptoms worse for 4-6 weeks, or as long as told.  Ask your doctor when it is safe for you to drive.  Do exercises as told by your doctor. General instructions  Do not use any products that contain nicotine or tobacco, such as cigarettes and e-cigarettes. These can delay bone healing. If you need help quitting, ask your doctor.  Take over-the-counter and prescription medicines only as told by your doctor.  Keep all follow-up visits as told by your doctor. This is important. Contact a doctor if:  Your medicine is not making you feel less pain.  Your medicine is not making swelling better. Get help right away if:  Your cannot feel your arm (your arm is numb).  Your arm is cold.  Your arm is a lighter color than normal. Summary  A clavicle fracture is a broken collarbone. The collarbone is the long bone that connects your shoulder to your chest wall.  Treatment depends on whether the broken ends of the bone are out of place or not.  If you have a sling, wear it as told by your doctor.  Do exercises when your doctor says you can. The exercises will help your arm get strong and move like it used to. This information is not intended to replace advice given to you by your health care provider. Make sure you discuss any questions you have with your health care provider. Document Revised: 01/04/2017 Document Reviewed: 12/12/2015 Elsevier Patient Education  2020 [employer].

## 2019-07-17 NOTE — Plan of Care (Signed)
  Problem: Education: Goal: Knowledge of General Education information will improve Description: Including pain rating scale, medication(s)/side effects and non-pharmacologic comfort measures Outcome: Progressing   Problem: Health Behavior/Discharge Planning: Goal: Ability to manage health-related needs will improve Outcome: Progressing   Problem: Pain Managment: Goal: General experience of comfort will improve Outcome: Progressing  Plan for transfer to CIR discussed, pain medication regimen also discussed, patient expresses concern about tylenol and liver function. Saverio Danker, PA advising that patient's liver function is acceptable at tylenol will work synergistically with opiates for better overall control of pain.

## 2019-07-18 LAB — BASIC METABOLIC PANEL
Anion gap: 5 (ref 5–15)
BUN: 43 mg/dL — ABNORMAL HIGH (ref 8–23)
CO2: 23 mmol/L (ref 22–32)
Calcium: 7.3 mg/dL — ABNORMAL LOW (ref 8.9–10.3)
Chloride: 106 mmol/L (ref 98–111)
Creatinine, Ser: 1.9 mg/dL — ABNORMAL HIGH (ref 0.61–1.24)
GFR calc Af Amer: 37 mL/min — ABNORMAL LOW (ref >60)
GFR calc non Af Amer: 32 mL/min — ABNORMAL LOW (ref >60)
Glucose, Bld: 162 mg/dL — ABNORMAL HIGH (ref 70–99)
Potassium: 4.6 mmol/L (ref 3.5–5.1)
Sodium: 134 mmol/L — ABNORMAL LOW (ref 135–145)

## 2019-07-18 LAB — GLUCOSE, CAPILLARY
Glucose-Capillary: 107 mg/dL — ABNORMAL HIGH (ref 70–99)
Glucose-Capillary: 143 mg/dL — ABNORMAL HIGH (ref 70–99)
Glucose-Capillary: 111 mg/dL — ABNORMAL HIGH (ref 70–99)

## 2019-07-18 NOTE — Progress Notes (Signed)
Physical Therapy Treatment Patient Details Name: [name] MRN: [id number] DOB: [birth year] Today's Date: 07/18/2019    History of Present Illness 86 y.o. male with prior MI, HTN presents after unwitnessed fall down stairs. Pt has a history of falls. Pt found to have L distal clavicle fx and L thumb fx, L posterior ribs 1-7 fx, and small SAH.    PT Comments    Pt received in bed, motivated and ready to participate in therapy. He required +2 mod/max assist bed mobility, +2 mod assist sit to stand and +2 mod assist SPT. Mobility continues to be limited by orthostatic hypotension. Pt was able to tolerate increased time this session being OOB, 20 minutes total. Pt returned to supine at end of session. LUE sling in place throughout session.  BP sitting EOB                              113/53 BP after transfer to [facility]               114/50  BP after 5-10 minutes on BSC      80/50 BP after return to supine                112/55               Follow Up Recommendations  CIR     Equipment Recommendations  Wheelchair (measurements PT);Wheelchair cushion (measurements PT)    Recommendations for Other Services       Precautions / Restrictions Precautions Precautions: Fall Required Braces or Orthoses: Splint/Cast;Sling Splint/Cast: thumb spica splint Restrictions LUE Weight Bearing: Non weight bearing Other Position/Activity Restrictions: LUE sling    Mobility  Bed Mobility Overal bed mobility: Needs Assistance Bed Mobility: Rolling;Sidelying to Sit;Sit to Supine Rolling: Max assist Sidelying to sit: +2 for physical assistance;Mod assist;Max assist   Sit to supine: +2 for physical assistance;Max assist   General bed mobility comments: assist with all aspects of mobility, cues for sequencing. Quick return to supine required at end of session due to orthostatic.  Transfers Overall transfer level: Needs assistance Equipment used: 2 person hand held assist Transfers: Sit  to/from [facility] Sit to Stand: +2 physical assistance;Mod assist Stand pivot transfers: +2 physical assistance;Mod assist       General transfer comment: pivot transfer to/from [facility] Pt able to take small, pivot steps with upright posture.  Ambulation/Gait                 Stairs             Wheelchair Mobility    Modified Rankin (Stroke Patients Only) Modified Rankin (Stroke Patients Only) Pre-Morbid Rankin Score: No symptoms Modified Rankin: Moderately severe disability     Balance Overall balance assessment: Needs assistance Sitting-balance support: Single extremity supported;Feet supported Sitting balance-Leahy Scale: Fair     Standing balance support: Single extremity supported;During functional activity Standing balance-Leahy Scale: Poor Standing balance comment: reliant on external support                            Cognition Arousal/Alertness: Awake/alert Behavior During Therapy: WFL for tasks assessed/performed   Area of Impairment: Problem solving;Attention;Memory;Safety/judgement;Awareness                   Current Attention Level: Selective Memory: Decreased short-term memory;Decreased recall of precautions   Safety/Judgement: Decreased awareness of deficits  Awareness: Emergent Problem Solving: Slow processing;Requires verbal cues;Requires tactile cues;Difficulty sequencing General Comments: Cognitively improving but still some confusion. Following simple commands. Oriented.      Exercises      General Comments General comments (skin integrity, edema, etc.): Mobility limited by orthostatic hypotension.      Pertinent Vitals/Pain Pain Assessment: Faces Faces Pain Scale: Hurts little more Pain Location: ribs during mobility Pain Descriptors / Indicators: Grimacing;Discomfort Pain Intervention(s): Limited activity within patient's tolerance;Monitored during session;Repositioned    Home Living                       Prior Function            PT Goals (current goals can now be found in the care plan section) Acute Rehab PT Goals Patient Stated Goal: To return to independent mobility Progress towards PT goals: Progressing toward goals    Frequency    Min 5X/week      PT Plan Current plan remains appropriate    Co-evaluation              AM-PAC PT "6 Clicks" Mobility   Outcome Measure  Help needed turning from your back to your side while in a flat bed without using bedrails?: A Lot Help needed moving from lying on your back to sitting on the side of a flat bed without using bedrails?: A Lot Help needed moving to and from a bed to a chair (including a wheelchair)?: A Lot Help needed standing up from a chair using your arms (e.g., wheelchair or bedside chair)?: A Lot Help needed to walk in hospital room?: A Lot Help needed climbing 3-5 steps with a railing? : Total 6 Click Score: 11    End of Session Equipment Utilized During Treatment: Gait belt;Other (comment) (LUE sling) Activity Tolerance: Treatment limited secondary to medical complications (Comment) (orthostatic) Patient left: in bed;with call bell/phone within reach;with family/visitor present Nurse Communication: Mobility status PT Visit Diagnosis: Other abnormalities of gait and mobility (R26.89);History of falling (Z91.81);Pain     Time: 1003-1037 PT Time Calculation (min) (ACUTE ONLY): 34 min  Charges:  $Therapeutic Activity: 23-37 mins                     [name], [place]  Office # [phone] Pager [phone]    [name] 07/18/2019, 12:36 PM

## 2019-07-18 NOTE — Progress Notes (Signed)
Subjective/Chief Complaint: No confusion this am, not oob much   Objective: Vital signs in last 24 hours: Temp:  [97.4 F (36.3 C)-98.7 F (37.1 C)] 98.7 F (37.1 C) (06/12 0739) Pulse Rate:  [64-80] 75 (06/12 0739) Resp:  [12-19] 13 (06/12 0739) BP: (91-121)/(38-51) 121/47 (06/12 0739) SpO2:  [91 %-100 %] 94 % (06/12 0739) Last BM Date: 07/18/19  Intake/Output from previous day: 06/11 0701 - 06/12 0700 In: 1391.9 [P.O.:550; I.V.:741.9; IV Piggyback:100] Out: 500 [Urine:500] Intake/Output this shift: Total I/O In: 356.9 [I.V.:356.9] Out: 550 [Urine:550]  Gen: awake and alert, appropriate this am Neck: supple  Heart: rrr Lungs: clear bilaterally, chest wall tenderness as expected Abd: soft, NT/ND, except on left flank with ecchymosis MS: MAE, left hand in thumb spica splint, wiggles fingers Psych: A&Ox3   Lab Results:  Recent Labs    07/16/19 0808 07/17/19 0556  WBC 6.9 7.3  HGB 9.1* 8.4*  HCT 27.6* 25.9*  PLT 137* 146*   BMET Recent Labs    07/16/19 0808 07/17/19 0556  NA 136 137  K 4.7 4.7  CL 105 105  CO2 23 25  GLUCOSE 124* 123*  BUN 30* 41*  CREATININE 1.81* 2.20*  CALCIUM 8.3* 8.3*   PT/INR No results for input(s): LABPROT, INR in the last 72 hours. ABG No results for input(s): PHART, HCO3 in the last 72 hours.  Invalid input(s): PCO2, PO2  Studies/Results: MR CERVICAL SPINE WO CONTRAST  Result Date: 07/17/2019 CLINICAL DATA:  Neck trauma with midline tenderness. EXAM: MRI CERVICAL SPINE WITHOUT CONTRAST TECHNIQUE: Multiplanar, multisequence MR imaging of the cervical spine was performed. No intravenous contrast was administered. COMPARISON:  CT of the cervical spine July 14, 2019 FINDINGS: Alignment: Physiologic. Vertebrae: No fracture, evidence of discitis, or bone lesion. Cord: Normal signal and morphology. Posterior Fossa, vertebral arteries, paraspinal tissues: Absent flow void within the distal V2 segment of the left vertebral  artery, may represent slow flow versus occlusion. Disc levels: C2-3: Small posterior disc protrusion causing minimal indentation thecal sac without significant spinal canal stenosis. Uncovertebral and facet degenerative changes resulting in mild to moderate left neural foraminal narrowing. C3-4: Posterior disc protrusion causing mild spinal canal stenosis. Uncovertebral and facet degenerative changes resulting in mild right and moderate left neural foraminal narrowing. C4-5: Posterior disc osteophyte complex resulting in mild spinal canal stenosis. Uncovertebral and facet degenerative changes resulting in mild-to-moderate right and mild left neural foraminal narrowing. C5-6: Small posterior disc protrusion causing mild spinal canal stenosis. Uncovertebral and facet degenerative changes resulting in moderate right neural foraminal narrowing. C6-7: Posterior disc protrusion causing small indentation of the thecal sac without significant spinal canal stenosis. Uncovertebral and facet degenerative changes result in moderate right neural foraminal narrowing. C7-T1: No spinal canal or neural foraminal stenosis. IMPRESSION: 1. No acute osseous abnormality or abnormal cord signal. 2. Cervical spondylosis with multilevel posterior disc protrusions resulting in mild spinal canal stenosis from C3-4 through C5-6. 3. Moderate neural foraminal narrowing on the left at C2-3 and C3-4 and on the right at C5-6 and C6-7. 4. Absent flow void within the distal V2 segment of the left vertebral artery, may represent slow flow versus occlusion. Electronically Signed   By: Pedro Earls M.D.   On: 07/17/2019 08:42    Anti-infectives: Anti-infectives (From admission, onward)   None      Assessment/Plan: Fall Distal clavicle fx- orthorecommends non op management with sling. Left thumb fx dislocation-orthohandrecommends non op management. fx relocated and currently inthumb spica splint  TBI/SAH-NSGYwithno  plans for repeat head scan unless he changes neurologically.slightly forgetfulness at times, but stable. TBI therapies ordered Left posterior 1-7 rib fxwith trace PTX- repeat CXRstable with no PTX. No SOB today and sats good. Pain control. PT/OT for mobilization ABL anemia secondary to paraspinal muscle hematoma and abdominal wall contusion ? Perinephric contusion- UA negative for blood.  ARI- cr 2.20.  Will restart gentle hydration at 75cc/hr with NS.  Recheck BMET in am pending Neck Pain- appreciate NSGY input.  MRI c-spine negative.  HTN- resume home meds Prediabetes - hgbA1c of 6.5. Carb mod diet and SSI as needed FEN-carb mod diet VTE- Lovenox 30mg  BID ID -none currently needed  Rolm Bookbinder 07/18/2019

## 2019-07-19 DIAGNOSIS — S42032A Displaced fracture of lateral end of left clavicle, initial encounter for closed fracture: Secondary | ICD-10-CM

## 2019-07-19 LAB — BASIC METABOLIC PANEL
Anion gap: 9 (ref 5–15)
BUN: 36 mg/dL — ABNORMAL HIGH (ref 8–23)
CO2: 16 mmol/L — ABNORMAL LOW (ref 22–32)
Calcium: 7.5 mg/dL — ABNORMAL LOW (ref 8.9–10.3)
Chloride: 110 mmol/L (ref 98–111)
Creatinine, Ser: 1.58 mg/dL — ABNORMAL HIGH (ref 0.61–1.24)
GFR calc Af Amer: 46 mL/min — ABNORMAL LOW (ref >60)
GFR calc non Af Amer: 40 mL/min — ABNORMAL LOW (ref >60)
Glucose, Bld: 131 mg/dL — ABNORMAL HIGH (ref 70–99)
Potassium: 4.7 mmol/L (ref 3.5–5.1)
Sodium: 135 mmol/L (ref 135–145)

## 2019-07-19 LAB — GLUCOSE, CAPILLARY
Glucose-Capillary: 110 mg/dL — ABNORMAL HIGH (ref 70–99)
Glucose-Capillary: 115 mg/dL — ABNORMAL HIGH (ref 70–99)
Glucose-Capillary: 128 mg/dL — ABNORMAL HIGH (ref 70–99)
Glucose-Capillary: 131 mg/dL — ABNORMAL HIGH (ref 70–99)

## 2019-07-19 MED ORDER — OXYCODONE HCL 5 MG PO TABS
2.5000 mg | ORAL_TABLET | ORAL | Status: DC | PRN
  Administered 2019-07-19 (×2): 5 mg via ORAL
  Filled 2019-07-19 (×2): qty 1

## 2019-07-19 MED ORDER — WHITE PETROLATUM EX OINT
TOPICAL_OINTMENT | CUTANEOUS | Status: AC
  Filled 2019-07-19: qty 28.35

## 2019-07-19 NOTE — Evaluation (Signed)
Speech Language Pathology Evaluation Patient Details Name: [name] MRN: [id number] DOB: 12-Aug-1933 Today's Date: 07/19/2019 Time: 5974-1638 SLP Time Calculation (min) (ACUTE ONLY): 38 min  Problem List:  Patient Active Problem List   Diagnosis Date Noted  . Traumatic brain injury with loss of consciousness ([place])   . Multiple trauma   . Benign prostatic hyperplasia   . Essential hypertension   . AKI (acute kidney injury) ([place])   . Stage 3b chronic kidney disease   . Prediabetes   . Acute blood loss anemia   . Thrombocytopenia ([place])   . Fall 07/14/2019   Past Medical History:  Past Medical History:  Diagnosis Date  . BPH (benign prostatic hyperplasia)   . Coronary artery disease   . High cholesterol   . Hypertension   . MI (myocardial infarction) ([place])   . Prediabetes    Past Surgical History:  Past Surgical History:  Procedure Laterality Date  . APPENDECTOMY    . CARDIAC CATHETERIZATION    . EXPLORATORY LAPAROTOMY     HPI:  86 y.o. male with prior MI, HTN presents after unwitnessed fall down stairs. Pt has a history of falls. Pt found to have L distal clavicle fx and L thumb fx, L posterior ribs 1-7 fx, and small L SAH.     Assessment / Plan / Recommendation Clinical Impression  Pt was seen for a cognitive-linguistic evaluation in the setting of a L SAH.  Pt was encountered awake/alert with wife present at bedside.  Pt currently lives with his wife and two adult daughters.  He reported that he and his wife share responsibilities for the financial management, but that he manages his own medications at baseline.  Wife reported that she recently began to notice the pt making some mistakes when working on finances.  He reported that he is an active member in his church and that he records discussions via zoom to engage with his church remotely.  Pt reported baseline short-term memory deficits.  Pt completed portions of the Montreal Cognitive Assessment and the Western  Aphasia Battery in addition to informal evaluation measures.  Pt presents with a cognitive-linguistic impairment c/b difficulty with short-term memory, attention, executive functioning, and numeric problem solving.   Pt was also noted to have intermittent anomia in conversational speech, and the pt's wife reported that he has exhibited code-switching (pt speaks multiple languages) during this admission when he appeared to be searching for a particular word.  Dysarthria was not noted in conversational speech.    Recommend additional ST targeting cognitive-linguistic deficits 5x weekly (Inpatient Rehabilitation) at time of discharge.  Additionally recommend assistance and supervision with IADLs (medications, finances, etc.).  Pt and wife were educated regarding all results/recommendations and they verbalized understanding.  SLP will f/u acutely for skilled ST targeting cognitive-linguistic deficits.      SLP Assessment  SLP Recommendation/Assessment: Patient needs continued Speech Lanaguage Pathology Services SLP Visit Diagnosis: Cognitive communication deficit (R41.841)    Follow Up Recommendations  Inpatient Rehab    Frequency and Duration min 2x/week  2 weeks      SLP Evaluation Cognition  Overall Cognitive Status: Impaired/Different from baseline Arousal/Alertness: Awake/alert Orientation Level: Oriented X4 Attention: Sustained Sustained Attention: Impaired Sustained Attention Impairment: Verbal complex Memory: Impaired Memory Impairment: Decreased short term memory;Decreased recall of new information Decreased Short Term Memory: Verbal basic Immediate Memory Recall: Sock;Blue;Bed Memory Recall Sock: With Cue Memory Recall Blue: With Cue Memory Recall Bed: With Cue Awareness: Appears intact Problem Solving: Impaired  Problem Solving Impairment: Verbal complex;Functional complex Executive Function: Writer: Impaired Organizing Impairment: Verbal  complex Safety/Judgment: Appears intact       Comprehension  Auditory Comprehension Overall Auditory Comprehension: Appears within functional limits for tasks assessed    Expression Verbal Expression Overall Verbal Expression: Impaired Initiation: No impairment Repetition: No impairment Naming: No impairment Pragmatics: No impairment Other Verbal Expression Comments: Mild anomia noted intermittently  Written Expression Dominant Hand: Right Written Expression: Not tested   Oral / Motor  Oral Motor/Sensory Function Overall Oral Motor/Sensory Function: Within functional limits Motor Speech Overall Motor Speech: Appears within functional limits for tasks assessed   GO                   Colin Mulders M.S., [place] Acute Rehabilitation Services Office: [phone]  Elvia Collum [facility] 07/19/2019, 11:47 AM

## 2019-07-19 NOTE — Progress Notes (Signed)
   Trauma/Critical Care Follow Up Note  Subjective:    Overnight Issues:   Objective:  Vital signs for last 24 hours: Temp:  [98 F (36.7 C)-98.5 F (36.9 C)] 98.5 F (36.9 C) (06/13 0328) Pulse Rate:  [74-104] 80 (06/13 0328) Resp:  [11-25] 15 (06/13 0328) BP: (80-125)/(42-57) 121/47 (06/13 0328) SpO2:  [96 %] 96 % (06/12 1521)  Hemodynamic parameters for last 24 hours:    Intake/Output from previous day: 06/12 0701 - 06/13 0700 In: 1151.1 [P.O.:480; I.V.:671.1] Out: 1650 [Urine:1650]  Intake/Output this shift: No intake/output data recorded.  Vent settings for last 24 hours:    Physical Exam:  Gen: comfortable, no distress Neuro: non-focal exam HEENT: PERRL Neck: supple CV: RRR Pulm: unlabored breathing Abd: soft, NT GU: clear yellow urine Extr: wwp, no edema   Results for orders placed or performed during the hospital encounter of 07/14/19 (from the past 24 hour(s))  Basic metabolic panel     Status: Abnormal   Collection Time: 07/18/19 11:29 AM  Result Value Ref Range   Sodium 134 (L) 135 - 145 mmol/L   Potassium 4.6 3.5 - 5.1 mmol/L   Chloride 106 98 - 111 mmol/L   CO2 23 22 - 32 mmol/L   Glucose, Bld 162 (H) 70 - 99 mg/dL   BUN 43 (H) 8 - 23 mg/dL   Creatinine, Ser 1.90 (H) 0.61 - 1.24 mg/dL   Calcium 7.3 (L) 8.9 - 10.3 mg/dL   GFR calc non Af Amer 32 (L) >60 mL/min   GFR calc Af Amer 37 (L) >60 mL/min   Anion gap 5 5 - 15  Glucose, capillary     Status: Abnormal   Collection Time: 07/18/19 11:37 AM  Result Value Ref Range   Glucose-Capillary 143 (H) 70 - 99 mg/dL  Glucose, capillary     Status: Abnormal   Collection Time: 07/18/19  5:03 PM  Result Value Ref Range   Glucose-Capillary 111 (H) 70 - 99 mg/dL    Assessment & Plan: Present on Admission: . Fall    LOS: 5 days   Additional comments:I reviewed the patient's new clinical lab test results.   and I reviewed the patients new imaging test results.    Fall  Distal clavicle fx-  orthorecommends non op management with sling. Left thumb fx dislocation-orthohandrecommends non op management. fx relocated and currently inthumb spica splint TBI/SAH-NSGYwithno plans for repeat head scan unless he changes neurologically.Slight forgetfulness at times, but stable. TBI therapies ordered Left posterior 1-7 rib fxwith trace PTX- repeat CXRstable with no PTX. No SOB today and sats good. Pain control. PT/OT for mobilization ABL anemia secondary to paraspinal muscle hematoma and abdominal wall contusion ? Perinephric contusion- UA negative for blood.  ARI-cr 1.9. Gentle hydration at 42cc/hr with NS. Neck Pain-appreciate NSGY input. MRI c-spine negative.  HTN- resume home meds Prediabetes - hgbA1c of 6.5. Carb mod diet and SSI as needed FEN-carb mod diet VTE-Lovenox 30mg  BID ID -none currently needed Dispo - 4NP, pending d/c to CIR vs SNF   Jesusita Oka, MD Trauma & General Surgery Please use [email or web address] to contact on call provider  07/19/2019  *Care during the described time interval was provided by me. I have reviewed this patient's available data, including medical history, events of note, physical examination and test results as part of my evaluation.

## 2019-07-20 LAB — GLUCOSE, CAPILLARY
Glucose-Capillary: 115 mg/dL — ABNORMAL HIGH (ref 70–99)
Glucose-Capillary: 117 mg/dL — ABNORMAL HIGH (ref 70–99)
Glucose-Capillary: 119 mg/dL — ABNORMAL HIGH (ref 70–99)
Glucose-Capillary: 130 mg/dL — ABNORMAL HIGH (ref 70–99)

## 2019-07-20 MED ORDER — CHLORHEXIDINE GLUCONATE CLOTH 2 % EX PADS
6.0000 | MEDICATED_PAD | Freq: Every day | CUTANEOUS | Status: DC
  Administered 2019-07-20 – 2019-07-22 (×2): 6 via TOPICAL

## 2019-07-20 MED ORDER — BETHANECHOL CHLORIDE 25 MG PO TABS
25.0000 mg | ORAL_TABLET | Freq: Three times a day (TID) | ORAL | Status: DC
  Administered 2019-07-20 – 2019-07-23 (×9): 25 mg via ORAL
  Filled 2019-07-20 (×9): qty 1

## 2019-07-20 MED ORDER — IBUPROFEN 200 MG PO TABS
400.0000 mg | ORAL_TABLET | Freq: Four times a day (QID) | ORAL | Status: DC | PRN
  Administered 2019-07-23 (×2): 400 mg via ORAL
  Filled 2019-07-20 (×2): qty 2

## 2019-07-20 MED ORDER — TRAMADOL HCL 50 MG PO TABS
50.0000 mg | ORAL_TABLET | Freq: Four times a day (QID) | ORAL | Status: DC | PRN
  Administered 2019-07-20: 50 mg via ORAL
  Filled 2019-07-20: qty 1

## 2019-07-20 NOTE — Progress Notes (Signed)
Physical Therapy Treatment Patient Details Name: [name] MRN: [id number] DOB: 1933-05-05 Today's Date: 07/20/2019    History of Present Illness 86 y.o. male with prior MI, HTN presents after unwitnessed fall down stairs. Pt has a history of falls. Pt found to have L distal clavicle fx and L thumb fx, L posterior ribs 1-7 fx, and small SAH.    PT Comments    Pt received in bed. Very sleepy with increased confusion. Pt/wife report he had a bad night and did not get much sleep. Session limited to bed mobility. Max assist rolling and +2 max assist sidelying to/from sit. Pt was able to transfer to recliner on Sunday (6/13) with nursing assist, tolerating OOB x 1 hour. Wife instructed in LE exercises (AP, heel slides, hip abd/add) for pt to perform in bed today as tolerated. PT to continue per POC.    Follow Up Recommendations  CIR     Equipment Recommendations  Wheelchair (measurements PT);Wheelchair cushion (measurements PT)    Recommendations for Other Services Rehab consult     Precautions / Restrictions Precautions Precautions: Fall Required Braces or Orthoses: Splint/Cast;Sling Splint/Cast: thumb spica splint Restrictions Other Position/Activity Restrictions: L rib fxs 1-7    Mobility  Bed Mobility Overal bed mobility: Needs Assistance Bed Mobility: Rolling;Sidelying to Sit;Sit to Sidelying Rolling: Max assist Sidelying to sit: +2 for physical assistance;Max assist     Sit to sidelying: Max assist;+2 for physical assistance General bed mobility comments: unable to progress OOB today due to pain, fatigue and lethargy  Transfers                    Ambulation/Gait                 Stairs             Wheelchair Mobility    Modified Rankin (Stroke Patients Only)       Balance                                            Cognition Arousal/Alertness: Lethargic;Suspect due to medications Behavior During Therapy: [facility]  for tasks assessed/performed Overall Cognitive Status: Impaired/Different from baseline Area of Impairment: Problem solving;Attention;Memory;Safety/judgement;Awareness;Orientation;Following commands                 Orientation Level: Disoriented to;Situation Current Attention Level: Sustained Memory: Decreased short-term memory;Decreased recall of precautions Following Commands: Follows one step commands with increased time Safety/Judgement: Decreased awareness of deficits Awareness: Emergent Problem Solving: Slow processing;Requires verbal cues;Requires tactile cues;Difficulty sequencing General Comments: More confused today as compared to 07/18/19 session, most likely due to pain meds. Pt/wife reports pt has a bad night with little sleep.      Exercises      General Comments        Pertinent Vitals/Pain Pain Assessment: Faces Faces Pain Scale: Hurts whole lot Pain Location: L flank with mobility Pain Descriptors / Indicators: Grimacing;Guarding;Discomfort;Tender Pain Intervention(s): Limited activity within patient's tolerance;Premedicated before session;Repositioned;Monitored during session    Home Living                      Prior Function            PT Goals (current goals can now be found in the care plan section) Acute Rehab PT Goals Patient Stated Goal: To return to independent mobility Progress  towards PT goals: Progressing toward goals    Frequency    Min 5X/week      PT Plan Current plan remains appropriate    Co-evaluation              AM-PAC PT "6 Clicks" Mobility   Outcome Measure  Help needed turning from your back to your side while in a flat bed without using bedrails?: A Lot Help needed moving from lying on your back to sitting on the side of a flat bed without using bedrails?: A Lot Help needed moving to and from a bed to a chair (including a wheelchair)?: A Lot Help needed standing up from a chair using your arms (e.g.,  wheelchair or bedside chair)?: A Lot Help needed to walk in hospital room?: A Lot Help needed climbing 3-5 steps with a railing? : Total 6 Click Score: 11    End of Session   Activity Tolerance: Patient limited by fatigue;Patient limited by pain;Patient limited by lethargy Patient left: in bed;with call bell/phone within reach;with family/visitor present Nurse Communication: Mobility status PT Visit Diagnosis: Other abnormalities of gait and mobility (R26.89);History of falling (Z91.81);Pain Pain - Right/Left: Left Pain - part of body: Shoulder;Hip;Ankle and joints of foot     Time: 1213-1232 PT Time Calculation (min) (ACUTE ONLY): 19 min  Charges:  $Therapeutic Activity: 8-22 mins                     Lorrin Goodell, PT  Office # [phone] Pager [phone]    Lorriane Shire 07/20/2019, 1:02 PM

## 2019-07-20 NOTE — Progress Notes (Signed)
Inpatient Rehabilitation-Admissions Coordinator   Expedited appeal has been initiated. Per health plan, we should have an update by Thursday.   Will update once there has been a determination.   Raechel Ache, OTR/L  Rehab Admissions Coordinator  [phone] 07/20/2019 2:33 PM

## 2019-07-20 NOTE — Progress Notes (Addendum)
2300: Patient began complaining of 9/10 pain in penis/scrotum and rectum. Patient stated he felt like he had to pee, but couldn't. Upon assessment, the patient's bladder felt distended and he endorsed increased pain with palpation. Bladder scan performed revealing 341 ml.  2310: On-call MD notified with verbal order to straight cath using a foley catheter and to leave the foley in place if more than 400 ml of urine was returned. Foley catheter was placed utilizing proper sterile technique, with Mitzi RN assisting. Immediately after insertion, 600 ml of clear, yellow urine was returned. Foley remains in place. Patient reports his pain is now 0/10. Will continue to monitor.

## 2019-07-20 NOTE — Progress Notes (Signed)
Patient ID: [name], male   DOB: Jun 10, 1933, 86 y.o.   MRN: [id number]     Subjective: Foley places for acute urinary retention Confused after oxy Had HA which is better now ROS negative except as listed above. Objective: Vital signs in last 24 hours: Temp:  [97.6 F (36.4 C)-98.6 F (37 C)] 98.3 F (36.8 C) (06/14 0700) Pulse Rate:  [82-116] 98 (06/14 0311) Resp:  [14-23] 18 (06/14 0311) BP: (114-125)/(46-60) 124/48 (06/14 0700) SpO2:  [93 %-98 %] 97 % (06/14 0311) Last BM Date: 07/18/19  Intake/Output from previous day: 06/13 0701 - 06/14 0700 In: -  Out: 3800 [Urine:3800] Intake/Output this shift: No intake/output data recorded.  General appearance: alert and cooperative Head: scalp abrasions stable Resp: clear to auscultation bilaterally Cardio: regular rate and rhythm GI: soft, NT Extremities: calves soft Neurologic: Mental status: a bit confused but reorients well  Lab Results: CBC  No results for input(s): WBC, HGB, HCT, PLT in the last 72 hours. BMET Recent Labs    07/18/19 1129 07/19/19 1058  NA 134* 135  K 4.6 4.7  CL 106 110  CO2 23 16*  GLUCOSE 162* 131*  BUN 43* 36*  CREATININE 1.90* 1.58*  CALCIUM 7.3* 7.5*   PT/INR No results for input(s): LABPROT, INR in the last 72 hours. ABG No results for input(s): PHART, HCO3 in the last 72 hours.  Invalid input(s): PCO2, PO2  Studies/Results: No results found.  Anti-infectives: Anti-infectives (From admission, onward)   None      Assessment/Plan: Fall  Distal clavicle fx- orthorecommends non op management with sling. Left thumb fx dislocation - orthohandrecommends non op management. fx relocated and currently inthumb spica splint TBI/SAH-NSGYwithno plans for repeat head scan unless he changes neurologically.Slight forgetfulness at times, but stable. TBI therapies ordered Left posterior 1-7 rib fxwith trace PTX- repeat CXRstable with no PTX. No SOB today and sats  good. Pain control. PT/OT for mobilization ABL anemia secondary to paraspinal muscle hematoma and abdominal wall contusion ? Perinephric contusion- UA negative for blood.  ARI-cr 1.6. SL Neck Pain-appreciate NSGY input. MRI c-spine negative.  HTN- resume home meds Prediabetes - hgbA1c of 6.5. Carb mod diet and SSI as needed FEN-carb mod diet Acute urinary retention - foley plan 48h, start urecholine, on doxazosin VTE-Lovenox 30mg  BID ID -none currently needed Dispo - 4NP, pending d/c to CIR (initially denied by insurance) - he has reached out to an executive at his insurance company. I spoke with his wife at the bedside.    LOS: 6 days    Georganna Skeans, MD, MPH, FACS Trauma & General Surgery Use [email or web address] to contact on call provider  07/20/2019

## 2019-07-21 LAB — GLUCOSE, CAPILLARY
Glucose-Capillary: 112 mg/dL — ABNORMAL HIGH (ref 70–99)
Glucose-Capillary: 126 mg/dL — ABNORMAL HIGH (ref 70–99)
Glucose-Capillary: 115 mg/dL — ABNORMAL HIGH (ref 70–99)
Glucose-Capillary: 118 mg/dL — ABNORMAL HIGH (ref 70–99)

## 2019-07-21 NOTE — Progress Notes (Signed)
Wife left to go home for a bit and called dismayed that rehab was denied again due to orthostatic BP readings from PT session. She is concerned and it is noted his PO  Intake is poor today. IVF KVO/SL. Pt expressed slight feeling of anxiety/heavy breath. Deep breathing. SPO2 98%. Vital signs stable when lying in bed. RR 14-16. O2 2l/[facility] for comfort while wife away. Text messaged concerns expressed to on call PA for trauma. Simmie Davies RN

## 2019-07-21 NOTE — Progress Notes (Signed)
Inpatient Rehabilitation-Admissions Coordinator   Reached out to insurance company regarding expedited appeal after receiving a call from the patient's wife stating he has been denied. Per my conversation with the [employer], a final determination has not yet been made. Assured I will be updated as soon a there is an answer regarding the fast appeal.   Spoke with pt's wife to make her aware that we are still waiting to hear a final determination.   Raechel Ache, OTR/L  Rehab Admissions Coordinator  [phone] 07/21/2019 5:05 PM

## 2019-07-21 NOTE — Progress Notes (Signed)
Physical Therapy Treatment Patient Details Name: [name] MRN: [id number] DOB: 1933-12-14 Today's Date: 07/21/2019    History of Present Illness 86 y.o. male with prior MI, HTN presents after unwitnessed fall down stairs. Pt has a history of falls. Pt found to have L distal clavicle fx and L thumb fx, L posterior ribs 1-7 fx, and small SAH.    PT Comments    Pt received in bed, alert and in good spirits. He is motivated and willing to participate in therapy. He required +2 mod/max assist bed mobility, +2 mod assist transfers, and +2 HH/mod assist ambulation 2' bed to [name]. Pt with less confusion today and decreased reports of pain. Pt able to have BM on BSC. Total assist with toilet hygiene by OT. Unable to further progress ambulation after time on BSC due to orthostatic hypotension. Pt supine in bed with HOB at 48 degrees at end of session.  BP supine before OOB       135/55 BP EOB                                 123/57 BP after transfer to [name]      105/66 BP after 5-10 min on BSC     99/48 BP after return to EOB            94/74 BP after return to supine        121/69   Follow Up Recommendations  CIR     Equipment Recommendations  Wheelchair (measurements PT);Wheelchair cushion (measurements PT)    Recommendations for Other Services       Precautions / Restrictions Precautions Precautions: Fall Required Braces or Orthoses: Splint/Cast;Sling Cervical Brace: Hard collar;For comfort Splint/Cast: thumb spica splint, Hard collar changed to 'for comfort.' Pt has been opting for no hard collar. Restrictions Other Position/Activity Restrictions: L rib fxs 1-7    Mobility  Bed Mobility Overal bed mobility: Needs Assistance Bed Mobility: Supine to Sit;Rolling;Sit to Sidelying Rolling: Mod assist   Supine to sit: +2 for physical assistance;Mod assist;Max assist;HOB elevated   Sit to sidelying: +2 for physical assistance;Mod assist;Max assist General bed mobility  comments: assist with BLE and trunk, use of bed pad to scoot to EOB, cues for sequencing, increased time  Transfers Overall transfer level: Needs assistance Equipment used: 2 person hand held assist Transfers: Sit to/from Stand;Stand Pivot Transfers Sit to Stand: +2 physical assistance;Mod assist Stand pivot transfers: +2 physical assistance;Mod assist       General transfer comment: cues for hand placement, sequencing, and posture; assist to power up; increased time to stabilize balance and attain full upright stance  Ambulation/Gait Ambulation/Gait assistance: Mod assist;+2 physical assistance Gait Distance (Feet): 2 Feet Assistive device: 1 person hand held assist Gait Pattern/deviations: Shuffle Gait velocity: decreased   General Gait Details: cues for sequencing, assist for balance; short shuffle steps bed to [name]   Stairs             Wheelchair Mobility    Modified Rankin (Stroke Patients Only) Modified Rankin (Stroke Patients Only) Pre-Morbid Rankin Score: No symptoms Modified Rankin: Moderately severe disability     Balance Overall balance assessment: Needs assistance Sitting-balance support: Feet supported;Single extremity supported Sitting balance-Leahy Scale: Fair Sitting balance - Comments: RUE support to maintain balance EOB   Standing balance support: Single extremity supported;During functional activity Standing balance-Leahy Scale: Poor Standing balance comment: reliant on external support  Cognition Arousal/Alertness: Awake/alert Behavior During Therapy: WFL for tasks assessed/performed Overall Cognitive Status: Impaired/Different from baseline Area of Impairment: Attention;Memory;Following commands;Safety/judgement;Awareness;Problem solving                   Current Attention Level: Sustained Memory: Decreased short-term memory;Decreased recall of precautions Following Commands: Follows one step  commands with increased time Safety/Judgement: Decreased awareness of deficits Awareness: Emergent Problem Solving: Slow processing;Requires verbal cues;Requires tactile cues;Difficulty sequencing General Comments: Pt more alert today with decreased confusion during session. Oriented x 4. Confusion more noted with fatigue.      Exercises      General Comments General comments (skin integrity, edema, etc.): Orthostatic with prolonged time OOB      Pertinent Vitals/Pain Pain Assessment: Faces Faces Pain Scale: Hurts a little bit Pain Location: generalized with mobility Pain Descriptors / Indicators: Discomfort Pain Intervention(s): Monitored during session;Repositioned    Home Living                      Prior Function            PT Goals (current goals can now be found in the care plan section) Acute Rehab PT Goals Patient Stated Goal: To return to independent mobility Progress towards PT goals: Progressing toward goals    Frequency    Min 5X/week      PT Plan Current plan remains appropriate    Co-evaluation PT/OT/SLP Co-Evaluation/Treatment: Yes Reason for Co-Treatment: Complexity of the patient's impairments (multi-system involvement);For patient/therapist safety;Necessary to address cognition/behavior during functional activity;To address functional/ADL transfers PT goals addressed during session: Mobility/safety with mobility;Balance        AM-PAC PT "6 Clicks" Mobility   Outcome Measure  Help needed turning from your back to your side while in a flat bed without using bedrails?: A Lot Help needed moving from lying on your back to sitting on the side of a flat bed without using bedrails?: A Lot Help needed moving to and from a bed to a chair (including a wheelchair)?: A Lot Help needed standing up from a chair using your arms (e.g., wheelchair or bedside chair)?: A Lot Help needed to walk in hospital room?: A Lot Help needed climbing 3-5 steps  with a railing? : Total 6 Click Score: 11    End of Session Equipment Utilized During Treatment: Gait belt;Other (comment) (LUE sling) Activity Tolerance: Patient tolerated treatment well Patient left: in bed;with call bell/phone within reach;with family/visitor present Nurse Communication: Mobility status PT Visit Diagnosis: Other abnormalities of gait and mobility (R26.89);History of falling (Z91.81);Pain Pain - Right/Left: Left Pain - part of body: Shoulder;Hip;Ankle and joints of foot     Time: 1749-4496 PT Time Calculation (min) (ACUTE ONLY): 34 min  Charges:  $Therapeutic Activity: 8-22 mins                     Lorrin Goodell, PT  Office # [phone] Pager [phone]    [name] 07/21/2019, 1:55 PM

## 2019-07-21 NOTE — Progress Notes (Signed)
Occupational Therapy Treatment Patient Details Name: [name] MRN: [id number] DOB: 1934/02/01 Today's Date: 07/21/2019    History of present illness 86 y.o. male with prior MI, HTN presents after unwitnessed fall down stairs. Pt has a history of falls. Pt found to have L distal clavicle fx and L thumb fx, L posterior ribs 1-7 fx, and small SAH.   OT comments  Pt seen in conjunction with PT to optimize pts activity tolerance and progress pt towards functional mobility goals. Overall, pt requires MOD A for stand pivot to [facility] as pt presents with posterior lean in standing. Pt required total A for posterior pericare in standing. Pt continues to be limited by orthostatic hypotension; see vitals below. Pt continues to be a great candidate for CIR. Will follow for OT needs needs.   supine- 135/55 EOB 123/57 BSC 105/66 after sitting on BSC ~ 10 mins 99/48 EOB 94/74 Supine 121/69  Follow Up Recommendations  CIR    Equipment Recommendations  Other (comment) (TBD at next venue of care)    Recommendations for Other Services      Precautions / Restrictions Precautions Precautions: Fall Required Braces or Orthoses: Splint/Cast;Sling Cervical Brace: Hard collar;For comfort Splint/Cast: thumb spica splint, Hard collar changed to 'for comfort.' Pt has been opting for no hard collar. Restrictions Weight Bearing Restrictions: Yes LUE Weight Bearing: Non weight bearing Other Position/Activity Restrictions: L rib fxs 1-7       Mobility Bed Mobility Overal bed mobility: Needs Assistance Bed Mobility: Supine to Sit;Rolling;Sit to Sidelying Rolling: Mod assist   Supine to sit: +2 for physical assistance;Mod assist;Max assist;HOB elevated   Sit to sidelying: +2 for physical assistance;Mod assist;Max assist General bed mobility comments: assist with BLE and trunk, use of bed pad to scoot to EOB, cues for sequencing, increased time  Transfers Overall transfer level: Needs  assistance Equipment used: 2 person hand held assist Transfers: Sit to/from Stand;Stand Pivot Transfers Sit to Stand: +2 physical assistance;Mod assist Stand pivot transfers: +2 physical assistance;Mod assist       General transfer comment: cues for hand placement, sequencing, and posture; assist to power up; increased time to stabilize balance and attain full upright stance    Balance Overall balance assessment: Needs assistance Sitting-balance support: Feet supported;Single extremity supported Sitting balance-Leahy Scale: Fair Sitting balance - Comments: RUE support to maintain balance EOB   Standing balance support: Single extremity supported;During functional activity Standing balance-Leahy Scale: Poor Standing balance comment: reliant on external support                           ADL either performed or assessed with clinical judgement   ADL Overall ADL's : Needs assistance/impaired                         Toilet Transfer: Moderate assistance;+2 for physical assistance;Stand-pivot;BSC Toilet Transfer Details (indicate cue type and reason): MOD A +2 d/t posterior lean during stand pivot transfer to [facility] to pts L side Toileting- Clothing Manipulation and Hygiene: Total assistance;Sit to/from stand Toileting - Clothing Manipulation Details (indicate cue type and reason): psoterior pericare in standing post BM     Functional mobility during ADLs: Moderate assistance;+2 for physical assistance;+2 for safety/equipment General ADL Comments: pt continues to present with hypotension impacting pts progression towards functional mobility goals     Vision       Perception     Praxis  Cognition Arousal/Alertness: Awake/alert Behavior During Therapy: WFL for tasks assessed/performed Overall Cognitive Status: Impaired/Different from baseline Area of Impairment: Attention;Memory;Following commands;Safety/judgement;Awareness;Problem solving                    Current Attention Level: Sustained Memory: Decreased short-term memory;Decreased recall of precautions Following Commands: Follows one step commands with increased time Safety/Judgement: Decreased awareness of deficits Awareness: Emergent Problem Solving: Slow processing;Requires verbal cues;Requires tactile cues;Difficulty sequencing General Comments: Pt more alert today with decreased confusion during session. Oriented x 4. Confusion more noted with fatigue.        Exercises     Shoulder Instructions       General Comments pt continues to be orthostatic with mobility: supine- 135/55, EOB 123/57, BSC 105/66, after sitting on BSC ~ 10 mins 99/48, EOB 94/74, Supine 121/69    Pertinent Vitals/ Pain       Pain Assessment: Faces Faces Pain Scale: Hurts a little bit Pain Location: generalized with mobility Pain Descriptors / Indicators: Discomfort Pain Intervention(s): Monitored during session;Repositioned  Home Living                                          Prior Functioning/Environment              Frequency  Min 2X/week        Progress Toward Goals  OT Goals(current goals can now be found in the care plan section)  Progress towards OT goals: Progressing toward goals  Acute Rehab OT Goals Patient Stated Goal: To return to independent mobility OT Goal Formulation: With patient Time For Goal Achievement: 07/30/19 Potential to Achieve Goals: Good  Plan Discharge plan remains appropriate;Frequency remains appropriate    Co-evaluation      Reason for Co-Treatment: Complexity of the patient's impairments (multi-system involvement);For patient/therapist safety;To address functional/ADL transfers;Necessary to address cognition/behavior during functional activity PT goals addressed during session: Mobility/safety with mobility;Balance        AM-PAC OT "6 Clicks" Daily Activity     Outcome Measure   Help from another person eating  meals?: A Little Help from another person taking care of personal grooming?: A Little Help from another person toileting, which includes using toliet, bedpan, or urinal?: A Lot Help from another person bathing (including washing, rinsing, drying)?: A Lot Help from another person to put on and taking off regular upper body clothing?: A Lot Help from another person to put on and taking off regular lower body clothing?: Total 6 Click Score: 13    End of Session Equipment Utilized During Treatment: Gait belt;Other (comment) (sling)  OT Visit Diagnosis: Pain;Other symptoms and signs involving cognitive function;History of falling (Z91.81);Muscle weakness (generalized) (M62.81);Other abnormalities of gait and mobility (R26.89) Pain - Right/Left: Left Pain - part of body: Arm;Leg   Activity Tolerance Treatment limited secondary to medical complications (Comment);Other (comment) ([place])   Patient Left in bed;with call bell/phone within reach;with family/visitor present   Nurse Communication Mobility status        Time: 5366-4403 OT Time Calculation (min): 32 min  Charges: OT General Charges $OT Visit: 1 Visit OT Treatments $Self Care/Home Management : 8-22 mins  Lanier Clam., COTA/L Acute Rehabilitation Services [phone]    Ihor Gully 07/21/2019, 4:47 PM

## 2019-07-21 NOTE — Progress Notes (Signed)
Patient ID: [name], male   DOB: 02-01-1934, 86 y.o.   MRN: [id number]     Subjective: Got somewhat confused after tramadol overnight Pain better ROS negative except as listed above. Objective: Vital signs in last 24 hours: Temp:  [98 F (36.7 C)-98.5 F (36.9 C)] 98.2 F (36.8 C) (06/15 0737) Pulse Rate:  [75-95] 84 (06/15 0737) Resp:  [15-24] 17 (06/15 0737) BP: (123-146)/(51-70) 134/63 (06/15 0737) SpO2:  [91 %-99 %] 96 % (06/15 0737) Last BM Date: 07/21/19  Intake/Output from previous day: 06/14 0701 - 06/15 0700 In: 480 [P.O.:480] Out: 1840 [Urine:1840] Intake/Output this shift: No intake/output data recorded.  General appearance: alert and cooperative Head: scalp abrasions Back: evolving hematoma and contusion Resp: clear to auscultation bilaterally Cardio: regular rate and rhythm GI: soft, non-tender; bowel sounds normal; no masses,  no organomegaly Extremities: calves soft Neuro A&O, F/C  Lab Results: CBC  No results for input(s): WBC, HGB, HCT, PLT in the last 72 hours. BMET Recent Labs    07/18/19 1129 07/19/19 1058  NA 134* 135  K 4.6 4.7  CL 106 110  CO2 23 16*  GLUCOSE 162* 131*  BUN 43* 36*  CREATININE 1.90* 1.58*  CALCIUM 7.3* 7.5*   PT/INR No results for input(s): LABPROT, INR in the last 72 hours. ABG No results for input(s): PHART, HCO3 in the last 72 hours.  Invalid input(s): PCO2, PO2  Studies/Results: No results found.  Anti-infectives: Anti-infectives (From admission, onward)   None      Assessment/Plan: Fall  Distal clavicle fx- orthorecommends non op management with sling. Left thumb fx dislocation - non-op TBI/SAH-NSGYwithno plans for repeat head scan unless he changes neurologically.Slight forgetfulness at times, but stable. TBI therapies ordered Left posterior 1-7 rib fxwith trace PTX- repeat CXRstable with no PTX. No SOB today and sats good. Pain control. PT/OT for mobilization ABL anemia  secondary to paraspinal muscle hematoma and abdominal wall contusion ? Perinephric contusion- UA negative for blood.  ARI-cr 1.6. SL Neck Pain-appreciate NSGY input. MRI c-spine negative.  HTN- resume home meds Prediabetes - hgbA1c of 6.5. Carb mod diet and SSI as needed FEN-D/C ultram due to confusion, ibuprofen Acute urinary retention - foley plan 48h (D/C 6/16), staredt urecholine, on doxazosin VTE-Lovenox 30mg  BID ID -none currently needed Dispo - 4NP, pending d/c to CIR (initially denied by insurance but now appeal pending) I spoke with his wife at the bedside.    LOS: 7 days    Georganna Skeans, MD, MPH, FACS Trauma & General Surgery Use [email or web address] to contact on call provider  07/21/2019

## 2019-07-22 LAB — BASIC METABOLIC PANEL
Anion gap: 10 (ref 5–15)
BUN: 29 mg/dL — ABNORMAL HIGH (ref 8–23)
CO2: 20 mmol/L — ABNORMAL LOW (ref 22–32)
Calcium: 8.2 mg/dL — ABNORMAL LOW (ref 8.9–10.3)
Chloride: 106 mmol/L (ref 98–111)
Creatinine, Ser: 1.44 mg/dL — ABNORMAL HIGH (ref 0.61–1.24)
GFR calc Af Amer: 52 mL/min — ABNORMAL LOW (ref >60)
GFR calc non Af Amer: 45 mL/min — ABNORMAL LOW (ref >60)
Glucose, Bld: 153 mg/dL — ABNORMAL HIGH (ref 70–99)
Potassium: 5 mmol/L (ref 3.5–5.1)
Sodium: 136 mmol/L (ref 135–145)

## 2019-07-22 LAB — GLUCOSE, CAPILLARY
Glucose-Capillary: 94 mg/dL (ref 70–99)
Glucose-Capillary: 99 mg/dL (ref 70–99)
Glucose-Capillary: 141 mg/dL — ABNORMAL HIGH (ref 70–99)
Glucose-Capillary: 139 mg/dL — ABNORMAL HIGH (ref 70–99)
Glucose-Capillary: 143 mg/dL — ABNORMAL HIGH (ref 70–99)

## 2019-07-22 MED ORDER — LACTATED RINGERS IV SOLN: INTRAVENOUS | Status: DC

## 2019-07-22 NOTE — Progress Notes (Signed)
Physical Therapy Treatment Patient Details Name: [name] MRN: [id number] DOB: [birth year] Today's Date: 07/22/2019    History of Present Illness 86 y.o. male with prior MI, HTN presents after unwitnessed fall down stairs. Pt has a history of falls. Pt found to have L distal clavicle fx and L thumb fx, L posterior ribs 1-7 fx, and small SAH.    PT Comments    Pt tolerates treatment well with multiple sit to stand and transfers from bed to bedside commode. Pt tolerates multiple attempts at dynamic standing balance to perform hygiene tasks. Pt does experience some dizziness while on commode, with gradual drop in BP with continued OOB activity. Pt demonstrates improved quality of sit to stands with less assistance requirements to initiate stand however continues to need modA due to posterior lean. PT continues to recommend high intensity inpatient PT services at this time as the pt demonstrates improved activity tolerance this session, 53 minutes, and demonstrates the potential to return to a supervision mobility level with aggressive mobility. PT encourages nursing staff to assist pt in transfers to bedside commode or recliner with use of SARA STEDY.   Follow Up Recommendations  CIR     Equipment Recommendations  3in1 (PT);Wheelchair (measurements PT);Wheelchair cushion (measurements PT);Hospital bed    Recommendations for Other Services       Precautions / Restrictions Precautions Precautions: Fall Precaution Booklet Issued: No Precaution Comments: verbally reviewed WB precautions, pt requiring verbal cues initially during session Required Braces or Orthoses: Splint/Cast;Sling Splint/Cast: thumb spica splint, Hard collar changed to 'for comfort.' Pt has been opting for no hard collar. Restrictions Weight Bearing Restrictions: Yes LUE Weight Bearing: Non weight bearing Other Position/Activity Restrictions: L rib fxs 1-7    Mobility  Bed Mobility Overal bed mobility: Needs  Assistance Bed Mobility: Supine to Sit;Sit to Supine Rolling: Mod assist   Supine to sit: Mod assist;HOB elevated Sit to supine: Mod assist      Transfers Overall transfer level: Needs assistance Equipment used: 1 person hand held assist Transfers: Sit to/from [employer] Transfers Sit to Stand: Mod assist Stand pivot transfers: Mod assist       General transfer comment: pt performs 5 sit to stands this session with hand hold. Pt performs 2 stand step turn transfers with hand hold  Ambulation/Gait Ambulation/Gait assistance: Mod assist Gait Distance (Feet): 2 Feet Assistive device: 1 person hand held assist Gait Pattern/deviations: Shuffle;Decreased stance time - left Gait velocity: decreased Gait velocity interpretation: <1.31 ft/sec, indicative of household ambulator General Gait Details: pt with short shuffling steps from bed to bedside commode and back, reduced WB on LLE 2/2 hip discomfort.   Stairs             Wheelchair Mobility    Modified Rankin (Stroke Patients Only) Modified Rankin (Stroke Patients Only) Pre-Morbid Rankin Score: No symptoms Modified Rankin: Moderately severe disability     Balance Overall balance assessment: Needs assistance Sitting-balance support: Single extremity supported;Feet supported Sitting balance-Leahy Scale: Fair Sitting balance - Comments: minG at edge of bed, progresses to close supervision on bedside commode   Standing balance support: Single extremity supported Standing balance-Leahy Scale: Poor Standing balance comment: min-modA, posterior lean without UE support                            Cognition Arousal/Alertness: Awake/alert Behavior During Therapy: WFL for tasks assessed/performed Overall Cognitive Status: Impaired/Different from baseline Area of Impairment: Attention;Memory;Following commands;Safety/judgement;Awareness;Problem solving  Current Attention Level:  Sustained Memory: Decreased recall of precautions;Decreased short-term memory Following Commands: Follows one step commands consistently;Follows multi-step commands with increased time Safety/Judgement: Decreased awareness of safety;Decreased awareness of deficits Awareness: Emergent Problem Solving: Slow processing;Requires verbal cues;Requires tactile cues;Difficulty sequencing        Exercises      General Comments General comments (skin integrity, edema, etc.): BP initially 125/55, pt transfers to bedside commode with BP of 119/49, pt reports dizziness after stand at bedside commode with BP drop to 105/52, BP then drops further to 96/81 with continued sitting on commode. BP of 113/68 upon return to supine.      Pertinent Vitals/Pain Pain Assessment: Faces Faces Pain Scale: Hurts even more Pain Location: L hip, L shoulder and L ribs Pain Descriptors / Indicators: Grimacing Pain Intervention(s): Monitored during session    Home Living                      Prior Function            PT Goals (current goals can now be found in the care plan section) Acute Rehab PT Goals Patient Stated Goal: To return to independent mobility Progress towards PT goals: Progressing toward goals    Frequency    Min 5X/week      PT Plan Current plan remains appropriate    Co-evaluation              AM-PAC PT "6 Clicks" Mobility   Outcome Measure  Help needed turning from your back to your side while in a flat bed without using bedrails?: A Lot Help needed moving from lying on your back to sitting on the side of a flat bed without using bedrails?: A Lot Help needed moving to and from a bed to a chair (including a wheelchair)?: A Lot Help needed standing up from a chair using your arms (e.g., wheelchair or bedside chair)?: A Lot Help needed to walk in hospital room?: A Lot Help needed climbing 3-5 steps with a railing? : Total 6 Click Score: 11    End of Session    Activity Tolerance: Patient tolerated treatment well Patient left: in bed;with call bell/phone within reach;with bed alarm set;with family/visitor present Nurse Communication: Mobility status PT Visit Diagnosis: Other abnormalities of gait and mobility (R26.89);History of falling (Z91.81);Pain Pain - Right/Left: Left Pain - part of body: Shoulder;Hip;Ankle and joints of foot     Time: 1047-1140 PT Time Calculation (min) (ACUTE ONLY): 53 min  Charges:  $Therapeutic Activity: 38-52 mins                     Zenaida Niece, PT, DPT Acute Rehabilitation Pager: [phone]    Zenaida Niece 07/22/2019, 12:33 PM

## 2019-07-22 NOTE — TOC Progression Note (Signed)
Transition of Care [facility]) - Progression Note    Patient Details  Name: [name] MRN: [id number] Date of Birth: 13-Jun-1933  Transition of Care [facility]) CM/SW Contact  Ella Bodo, RN Phone Number: 07/22/2019, 3:08 PM  Clinical Narrative:   Pt was denied for CIR and peer to peer was also denied.  Family chose to initiate an expedited appeal, which was done on 07/20/2019.  We should have an answer by tomorrow, 6/17.  Will follow.     Expected Discharge Plan: IP Rehab Facility Barriers to Discharge: Continued Medical Work up  Expected Discharge Plan and Services Expected Discharge Plan: [place]       Living arrangements for the past 2 months: Single Family Home                                       Social Determinants of Health (SDOH) Interventions    Readmission Risk Interventions No flowsheet data found.  Reinaldo Raddle, RN, BSN  Trauma/Neuro ICU Case Manager [phone]

## 2019-07-22 NOTE — Progress Notes (Signed)
SLP Cancellation Note  Patient Details Name: [name] MRN: [id number] DOB: 08/14/1933   Cancelled treatment:       Reason Eval/Treat Not Completed: Patient at procedure or test/unavailable. Pt currently with nursing for personal care. Will continue efforts for skilled ST intervention for memory, attention and problem solving deficits.  Keilon Ressel B. Quentin Ore, [facility], [place] Speech Language Pathologist Office: [phone] Pager: [phone]  Shonna Chock 07/22/2019, 11:24 AM

## 2019-07-22 NOTE — Progress Notes (Signed)
Trauma/Critical Care Follow Up Note  Subjective:    Overnight Issues:   Objective:  Vital signs for last 24 hours: Temp:  [97.7 F (36.5 C)-98.4 F (36.9 C)] 98.2 F (36.8 C) (06/16 0739) Pulse Rate:  [52-127] 83 (06/16 0739) Resp:  [0-26] 16 (06/15 2115) BP: (114-153)/(53-77) 153/66 (06/16 0739) SpO2:  [64 %-100 %] 99 % (06/16 0739)  Hemodynamic parameters for last 24 hours:    Intake/Output from previous day: 06/15 0701 - 06/16 0700 In: 670 [P.O.:640; I.V.:30] Out: 1901 [Urine:1900; Stool:1]  Intake/Output this shift: No intake/output data recorded.  Vent settings for last 24 hours:    Physical Exam:  Gen: comfortable, no distress Neuro: non-focal exam HEENT: PERRL Neck: supple CV: RRR Pulm: unlabored breathing Abd: soft, NT GU: clear yellow urine Extr: wwp, no edema   Results for orders placed or performed during the hospital encounter of 07/14/19 (from the past 24 hour(s))  Glucose, capillary     Status: Abnormal   Collection Time: 07/21/19 11:22 AM  Result Value Ref Range   Glucose-Capillary 126 (H) 70 - 99 mg/dL  Glucose, capillary     Status: Abnormal   Collection Time: 07/21/19  3:49 PM  Result Value Ref Range   Glucose-Capillary 115 (H) 70 - 99 mg/dL  Glucose, capillary     Status: Abnormal   Collection Time: 07/21/19  9:51 PM  Result Value Ref Range   Glucose-Capillary 118 (H) 70 - 99 mg/dL  Glucose, capillary     Status: None   Collection Time: 07/22/19  7:37 AM  Result Value Ref Range   Glucose-Capillary 99 70 - 99 mg/dL  Glucose, capillary     Status: None   Collection Time: 07/22/19  7:50 AM  Result Value Ref Range   Glucose-Capillary 94 70 - 99 mg/dL    Assessment & Plan: The plan of care was discussed with the bedside nurse for the day, who is in agreement with this plan and no additional concerns were raised.   Present on Admission: . Fall    LOS: 8 days   Additional comments:I reviewed the patient's new clinical lab test  results.   and I reviewed the patients new imaging test results.    Fall  Distal clavicle fx- orthorecommends non op management with sling. Left thumb fx dislocation - orthohandrecommends non op management. fx relocated and currently inthumb spica splint TBI/SAH-NSGYwithno plans for repeat head scan unless he changes neurologically.Slight forgetfulness at times, but stable. TBI therapies ordered Left posterior 1-7 rib fxwith trace PTX- repeat CXRstable with no PTX. No SOB today and sats good. Pain control. PT/OT for mobilization ABL anemia secondary to paraspinal muscle hematoma and abdominal wall contusion ? Perinephric contusion- UA negative for blood.  AKI-cr 1.6 on 6/13, recheck today. Low rate MIVF for symptomatic relative hypotension.  Neck Pain-appreciate NSGY input. MRI c-spine negative.  HTN- holding home meds today due to symptomatic relative hypotension  Prediabetes - hgbA1c of 6.5. Carb mod diet and SSI as needed FEN-carb mod diet Acute urinary retention - foley plan 48h, urecholine, on doxazosin VTE-Lovenox 30mg  BID ID -none currently needed Dispo - 4NP, pending d/c to CIR (initially denied by insurance) - he has reached out to an executive at his insurance company.  I spoke with his wife extensively at the bedside.   Jesusita Oka, MD Trauma & General Surgery Please use [email or web address] to contact on call provider  07/22/2019  *Care during the described time interval was provided by me. I  have reviewed this patient's available data, including medical history, events of note, physical examination and test results as part of my evaluation.

## 2019-07-23 ENCOUNTER — Inpatient Hospital Stay (HOSPITAL_COMMUNITY)
Admission: RE | Admit: 2019-07-23 | Discharge: 2019-08-07 | DRG: 945 | Disposition: A | Discharge status: Home / Self Care | Payer: PPO | Source: Intra-hospital | Attending: Physical Medicine & Rehabilitation | Admitting: Physical Medicine & Rehabilitation

## 2019-07-23 ENCOUNTER — Encounter (HOSPITAL_COMMUNITY): Payer: Self-pay

## 2019-07-23 ENCOUNTER — Encounter: Payer: Self-pay | Admitting: Family Medicine

## 2019-07-23 ENCOUNTER — Other Ambulatory Visit: Payer: Self-pay

## 2019-07-23 ENCOUNTER — Encounter (HOSPITAL_COMMUNITY): Payer: Self-pay | Admitting: Physical Medicine & Rehabilitation

## 2019-07-23 DIAGNOSIS — M19012 Primary osteoarthritis, left shoulder: Secondary | ICD-10-CM | POA: Diagnosis not present

## 2019-07-23 DIAGNOSIS — N1832 Chronic kidney disease, stage 3b: Secondary | ICD-10-CM | POA: Diagnosis not present

## 2019-07-23 DIAGNOSIS — Z8249 Family history of ischemic heart disease and other diseases of the circulatory system: Secondary | ICD-10-CM

## 2019-07-23 DIAGNOSIS — S301XXD Contusion of abdominal wall, subsequent encounter: Secondary | ICD-10-CM

## 2019-07-23 DIAGNOSIS — S069X9A Unspecified intracranial injury with loss of consciousness of unspecified duration, initial encounter: Secondary | ICD-10-CM | POA: Diagnosis present

## 2019-07-23 DIAGNOSIS — S069X9S Unspecified intracranial injury with loss of consciousness of unspecified duration, sequela: Secondary | ICD-10-CM

## 2019-07-23 DIAGNOSIS — W108XXD Fall (on) (from) other stairs and steps, subsequent encounter: Secondary | ICD-10-CM | POA: Diagnosis present

## 2019-07-23 DIAGNOSIS — Z79899 Other long term (current) drug therapy: Secondary | ICD-10-CM

## 2019-07-23 DIAGNOSIS — S270XXD Traumatic pneumothorax, subsequent encounter: Secondary | ICD-10-CM

## 2019-07-23 DIAGNOSIS — S069X6D Unspecified intracranial injury with loss of consciousness greater than 24 hours without return to pre-existing conscious level with patient surviving, subsequent encounter: Secondary | ICD-10-CM | POA: Diagnosis not present

## 2019-07-23 DIAGNOSIS — S42009A Fracture of unspecified part of unspecified clavicle, initial encounter for closed fracture: Secondary | ICD-10-CM

## 2019-07-23 DIAGNOSIS — I129 Hypertensive chronic kidney disease with stage 1 through stage 4 chronic kidney disease, or unspecified chronic kidney disease: Secondary | ICD-10-CM | POA: Diagnosis not present

## 2019-07-23 DIAGNOSIS — I951 Orthostatic hypotension: Secondary | ICD-10-CM | POA: Diagnosis not present

## 2019-07-23 DIAGNOSIS — S299XXA Unspecified injury of thorax, initial encounter: Secondary | ICD-10-CM | POA: Diagnosis not present

## 2019-07-23 DIAGNOSIS — I2510 Atherosclerotic heart disease of native coronary artery without angina pectoris: Secondary | ICD-10-CM | POA: Diagnosis not present

## 2019-07-23 DIAGNOSIS — N179 Acute kidney failure, unspecified: Secondary | ICD-10-CM | POA: Diagnosis present

## 2019-07-23 DIAGNOSIS — F05 Delirium due to known physiological condition: Secondary | ICD-10-CM | POA: Diagnosis present

## 2019-07-23 DIAGNOSIS — N401 Enlarged prostate with lower urinary tract symptoms: Secondary | ICD-10-CM | POA: Diagnosis not present

## 2019-07-23 DIAGNOSIS — E1122 Type 2 diabetes mellitus with diabetic chronic kidney disease: Secondary | ICD-10-CM | POA: Diagnosis not present

## 2019-07-23 DIAGNOSIS — R319 Hematuria, unspecified: Secondary | ICD-10-CM | POA: Diagnosis present

## 2019-07-23 DIAGNOSIS — R338 Other retention of urine: Secondary | ICD-10-CM | POA: Diagnosis present

## 2019-07-23 DIAGNOSIS — Z7982 Long term (current) use of aspirin: Secondary | ICD-10-CM | POA: Diagnosis not present

## 2019-07-23 DIAGNOSIS — S2242XD Multiple fractures of ribs, left side, subsequent encounter for fracture with routine healing: Secondary | ICD-10-CM | POA: Diagnosis not present

## 2019-07-23 DIAGNOSIS — S069X0A Unspecified intracranial injury without loss of consciousness, initial encounter: Secondary | ICD-10-CM

## 2019-07-23 DIAGNOSIS — R7989 Other specified abnormal findings of blood chemistry: Secondary | ICD-10-CM | POA: Diagnosis not present

## 2019-07-23 DIAGNOSIS — S42032A Displaced fracture of lateral end of left clavicle, initial encounter for closed fracture: Secondary | ICD-10-CM | POA: Diagnosis not present

## 2019-07-23 DIAGNOSIS — S066X9D Traumatic subarachnoid hemorrhage with loss of consciousness of unspecified duration, subsequent encounter: Principal | ICD-10-CM

## 2019-07-23 DIAGNOSIS — S0003XD Contusion of scalp, subsequent encounter: Secondary | ICD-10-CM

## 2019-07-23 DIAGNOSIS — K5900 Constipation, unspecified: Secondary | ICD-10-CM | POA: Diagnosis present

## 2019-07-23 DIAGNOSIS — D62 Acute posthemorrhagic anemia: Secondary | ICD-10-CM | POA: Diagnosis not present

## 2019-07-23 DIAGNOSIS — N289 Disorder of kidney and ureter, unspecified: Secondary | ICD-10-CM | POA: Diagnosis not present

## 2019-07-23 DIAGNOSIS — R339 Retention of urine, unspecified: Secondary | ICD-10-CM | POA: Diagnosis not present

## 2019-07-23 DIAGNOSIS — T148XXA Other injury of unspecified body region, initial encounter: Secondary | ICD-10-CM

## 2019-07-23 DIAGNOSIS — S069X3S Unspecified intracranial injury with loss of consciousness of 1 hour to 5 hours 59 minutes, sequela: Secondary | ICD-10-CM | POA: Diagnosis not present

## 2019-07-23 DIAGNOSIS — M549 Dorsalgia, unspecified: Secondary | ICD-10-CM

## 2019-07-23 DIAGNOSIS — G3184 Mild cognitive impairment, so stated: Secondary | ICD-10-CM | POA: Diagnosis not present

## 2019-07-23 DIAGNOSIS — Z800 Family history of malignant neoplasm of digestive organs: Secondary | ICD-10-CM

## 2019-07-23 DIAGNOSIS — E119 Type 2 diabetes mellitus without complications: Secondary | ICD-10-CM

## 2019-07-23 DIAGNOSIS — S62512A Displaced fracture of proximal phalanx of left thumb, initial encounter for closed fracture: Secondary | ICD-10-CM | POA: Diagnosis not present

## 2019-07-23 DIAGNOSIS — S42002S Fracture of unspecified part of left clavicle, sequela: Secondary | ICD-10-CM | POA: Diagnosis not present

## 2019-07-23 DIAGNOSIS — M546 Pain in thoracic spine: Secondary | ICD-10-CM | POA: Diagnosis present

## 2019-07-23 DIAGNOSIS — S42032D Displaced fracture of lateral end of left clavicle, subsequent encounter for fracture with routine healing: Secondary | ICD-10-CM

## 2019-07-23 DIAGNOSIS — M545 Low back pain: Secondary | ICD-10-CM | POA: Diagnosis not present

## 2019-07-23 DIAGNOSIS — N189 Chronic kidney disease, unspecified: Secondary | ICD-10-CM | POA: Diagnosis not present

## 2019-07-23 DIAGNOSIS — S069X0D Unspecified intracranial injury without loss of consciousness, subsequent encounter: Secondary | ICD-10-CM | POA: Diagnosis not present

## 2019-07-23 DIAGNOSIS — Z87891 Personal history of nicotine dependence: Secondary | ICD-10-CM | POA: Diagnosis not present

## 2019-07-23 DIAGNOSIS — Z955 Presence of coronary angioplasty implant and graft: Secondary | ICD-10-CM | POA: Diagnosis not present

## 2019-07-23 DIAGNOSIS — S2249XA Multiple fractures of ribs, unspecified side, initial encounter for closed fracture: Secondary | ICD-10-CM

## 2019-07-23 DIAGNOSIS — S069X2S Unspecified intracranial injury with loss of consciousness of 31 minutes to 59 minutes, sequela: Secondary | ICD-10-CM | POA: Diagnosis not present

## 2019-07-23 DIAGNOSIS — S62512D Displaced fracture of proximal phalanx of left thumb, subsequent encounter for fracture with routine healing: Secondary | ICD-10-CM | POA: Diagnosis not present

## 2019-07-23 DIAGNOSIS — R5381 Other malaise: Secondary | ICD-10-CM | POA: Diagnosis present

## 2019-07-23 DIAGNOSIS — S62502S Fracture of unspecified phalanx of left thumb, sequela: Secondary | ICD-10-CM

## 2019-07-23 DIAGNOSIS — S069X0S Unspecified intracranial injury without loss of consciousness, sequela: Secondary | ICD-10-CM

## 2019-07-23 LAB — GLUCOSE, CAPILLARY
Glucose-Capillary: 199 mg/dL — ABNORMAL HIGH (ref 70–99)
Glucose-Capillary: 114 mg/dL — ABNORMAL HIGH (ref 70–99)
Glucose-Capillary: 134 mg/dL — ABNORMAL HIGH (ref 70–99)
Glucose-Capillary: 127 mg/dL — ABNORMAL HIGH (ref 70–99)

## 2019-07-23 MED ORDER — LIDOCAINE HCL URETHRAL/MUCOSAL 2 % EX GEL: CUTANEOUS | Status: DC | PRN

## 2019-07-23 MED ORDER — LIDOCAINE 5 % EX PTCH
1.0000 | MEDICATED_PATCH | CUTANEOUS | Status: DC
  Administered 2019-07-24 – 2019-08-07 (×15): 1 via TRANSDERMAL
  Filled 2019-07-23 (×16): qty 1

## 2019-07-23 MED ORDER — ACETAMINOPHEN 325 MG PO TABS
650.0000 mg | ORAL_TABLET | Freq: Three times a day (TID) | ORAL | Status: DC
  Administered 2019-07-23 – 2019-08-05 (×50): 650 mg via ORAL
  Filled 2019-07-23 (×51): qty 2

## 2019-07-23 MED ORDER — DOXAZOSIN MESYLATE 2 MG PO TABS
2.0000 mg | ORAL_TABLET | Freq: Every day | ORAL | Status: DC
  Administered 2019-07-24 – 2019-08-07 (×15): 2 mg via ORAL
  Filled 2019-07-23 (×15): qty 1

## 2019-07-23 MED ORDER — DIPHENHYDRAMINE HCL 12.5 MG/5ML PO ELIX: 12.5000 mg | ORAL_SOLUTION | Freq: Four times a day (QID) | ORAL | Status: DC | PRN

## 2019-07-23 MED ORDER — ALUM & MAG HYDROXIDE-SIMETH 200-200-20 MG/5ML PO SUSP: 30.0000 mL | ORAL | Status: DC | PRN

## 2019-07-23 MED ORDER — BETHANECHOL CHLORIDE 25 MG PO TABS
25.0000 mg | ORAL_TABLET | Freq: Three times a day (TID) | ORAL | Status: DC
  Administered 2019-07-23 – 2019-08-03 (×33): 25 mg via ORAL
  Filled 2019-07-23 (×33): qty 1

## 2019-07-23 MED ORDER — PROCHLORPERAZINE MALEATE 5 MG PO TABS: 5.0000 mg | ORAL_TABLET | Freq: Four times a day (QID) | ORAL | Status: DC | PRN

## 2019-07-23 MED ORDER — CALCIUM CARBONATE-VITAMIN D 500-200 MG-UNIT PO TABS
1.0000 | ORAL_TABLET | Freq: Two times a day (BID) | ORAL | Status: DC
  Administered 2019-07-23 – 2019-08-07 (×30): 1 via ORAL
  Filled 2019-07-23 (×30): qty 1

## 2019-07-23 MED ORDER — METHOCARBAMOL 750 MG PO TABS
750.0000 mg | ORAL_TABLET | Freq: Four times a day (QID) | ORAL | Status: DC
  Administered 2019-07-23 – 2019-07-24 (×3): 750 mg via ORAL
  Filled 2019-07-23 (×3): qty 1

## 2019-07-23 MED ORDER — FLEET ENEMA 7-19 GM/118ML RE ENEM: 1.0000 | ENEMA | Freq: Once | RECTAL | Status: DC | PRN

## 2019-07-23 MED ORDER — POLYETHYLENE GLYCOL 3350 17 G PO PACK: 17.0000 g | PACK | Freq: Every day | ORAL | Status: DC | PRN

## 2019-07-23 MED ORDER — INSULIN ASPART 100 UNIT/ML ~~LOC~~ SOLN
0.0000 [IU] | Freq: Three times a day (TID) | SUBCUTANEOUS | Status: DC
  Administered 2019-07-24 – 2019-07-25 (×3): 1 [IU] via SUBCUTANEOUS
  Administered 2019-07-26: 2 [IU] via SUBCUTANEOUS
  Administered 2019-07-27: 1 [IU] via SUBCUTANEOUS

## 2019-07-23 MED ORDER — PROCHLORPERAZINE 25 MG RE SUPP: 12.5000 mg | Freq: Four times a day (QID) | RECTAL | Status: DC | PRN

## 2019-07-23 MED ORDER — GUAIFENESIN-DM 100-10 MG/5ML PO SYRP
5.0000 mL | ORAL_SOLUTION | Freq: Four times a day (QID) | ORAL | Status: DC | PRN
  Administered 2019-07-23 – 2019-07-24 (×2): 10 mL via ORAL
  Administered 2019-07-24: 5 mL via ORAL
  Administered 2019-07-25: 10 mL via ORAL
  Filled 2019-07-23 (×4): qty 10

## 2019-07-23 MED ORDER — ENOXAPARIN SODIUM 40 MG/0.4ML ~~LOC~~ SOLN
40.0000 mg | SUBCUTANEOUS | Status: DC
  Administered 2019-07-24 – 2019-08-07 (×15): 40 mg via SUBCUTANEOUS
  Filled 2019-07-23 (×15): qty 0.4

## 2019-07-23 MED ORDER — ADULT MULTIVITAMIN W/MINERALS CH
1.0000 | ORAL_TABLET | Freq: Every day | ORAL | Status: DC
  Administered 2019-07-23 – 2019-08-07 (×16): 1 via ORAL
  Filled 2019-07-23 (×17): qty 1

## 2019-07-23 MED ORDER — FLEET ENEMA 7-19 GM/118ML RE ENEM
1.0000 | ENEMA | Freq: Once | RECTAL | Status: DC
  Filled 2019-07-23: qty 1

## 2019-07-23 MED ORDER — CHLORHEXIDINE GLUCONATE CLOTH 2 % EX PADS
6.0000 | MEDICATED_PAD | Freq: Two times a day (BID) | CUTANEOUS | Status: DC
  Administered 2019-07-24: 6 via TOPICAL

## 2019-07-23 MED ORDER — ESCITALOPRAM OXALATE 10 MG PO TABS
10.0000 mg | ORAL_TABLET | Freq: Every day | ORAL | Status: DC
  Administered 2019-07-24 – 2019-08-07 (×15): 10 mg via ORAL
  Filled 2019-07-23 (×15): qty 1

## 2019-07-23 MED ORDER — BISACODYL 10 MG RE SUPP: 10.0000 mg | Freq: Every day | RECTAL | Status: DC | PRN

## 2019-07-23 MED ORDER — POLYETHYLENE GLYCOL 3350 17 G PO PACK
17.0000 g | PACK | Freq: Every day | ORAL | Status: DC
  Filled 2019-07-23: qty 1

## 2019-07-23 MED ORDER — FLUTICASONE PROPIONATE 50 MCG/ACT NA SUSP: 1.0000 | Freq: Every day | NASAL | Status: DC | PRN

## 2019-07-23 MED ORDER — INSULIN ASPART 100 UNIT/ML ~~LOC~~ SOLN: 0.0000 [IU] | Freq: Every day | SUBCUTANEOUS | Status: DC

## 2019-07-23 MED ORDER — PROCHLORPERAZINE EDISYLATE 10 MG/2ML IJ SOLN: 5.0000 mg | Freq: Four times a day (QID) | INTRAMUSCULAR | Status: DC | PRN

## 2019-07-23 MED ORDER — ALBUTEROL SULFATE (2.5 MG/3ML) 0.083% IN NEBU
2.5000 mg | INHALATION_SOLUTION | RESPIRATORY_TRACT | Status: DC | PRN
  Administered 2019-07-25 – 2019-07-30 (×3): 2.5 mg via RESPIRATORY_TRACT
  Filled 2019-07-23 (×3): qty 3

## 2019-07-23 NOTE — Progress Notes (Signed)
  Speech Language Pathology Treatment: Cognitive-Linquistic  Patient Details Name: [name] MRN: [id number] DOB: 20-Sep-1933 Today's Date: 07/23/2019 Time: 8377-9396 SLP Time Calculation (min) (ACUTE ONLY): 17 min  Assessment / Plan / Recommendation Clinical Impression  Pt demonstrated minimal word finding errors during spontaneous conversation, but he completed divergent naming tasks and verbal sequencing tasks with Mod I. He and his wife both report that although he has improved since initial evaluation, he still has not returned to his baseline. He will benefit from additional, higher level cognitive testing at next level of care.    HPI HPI: 86 y.o. male with prior MI, HTN presents after unwitnessed fall down stairs. Pt has a history of falls. Pt found to have L distal clavicle fx and L thumb fx, L posterior ribs 1-7 fx, and small L SAH.        SLP Plan  Continue with current plan of care       Recommendations                   Follow up Recommendations: Inpatient Rehab SLP Visit Diagnosis: Cognitive communication deficit (U86.484) Plan: Continue with current plan of care       GO                Osie Bond., M.A. [place] Acute Rehabilitation Services Pager [phone] Office [phone]  07/23/2019, 10:18 AM

## 2019-07-23 NOTE — Progress Notes (Signed)
Inpatient Rehabilitation-Admissions Coordinator   I have received approval from the patient's insurance company as his expedited appeal has been overturned in his favor. Notified pt and his wife of the approval and bed offer and they have accepted. I received medical clearance from Dr. Grandville Silos for admit to CIR today. RN and [facility] team updated on plan for admit today.   Reviewed all consent forms and insurance benefits letter with the patient and his wife. All questions answered.   Please call if questions.   Raechel Ache, OTR/L  Rehab Admissions Coordinator  [phone] 07/23/2019 11:03 AM

## 2019-07-23 NOTE — H&P (Signed)
Physical Medicine and Rehabilitation Admission H&P    Chief Complaint  Patient presents with  . Functional decline due to trauma with TBI and multiple fractures.      HPI: [name] is an 86 year old male with history of BPH, CAD, HTN, CKD III, prediabetes, mild cognitive impairment, prior falls who was admitted on 07/14/19 after unwitnessed fall down stairs.  He had amnesia of events and reports of headache, back and chest pain.  CT of the head showed small volume SAH on left scalp contusion, trace left PTX with left 1st-7th rib fractures, mild LUL contusion, 3.1 cm AAA, distal clavicle fracture, dislocation of left interphalangeal joint fourth digit and possible avulsion fracture of MCP joint with advanced degenerative changes.  Dr. Cyndy Freeze consulted for input and recommended conservative management with repeat head CT if neurological decline noted.  Cervical spine CT showed mild multilevel DDD.  CT of lumbar spine showed contusion left flank and paraspinal musculature with multilevel DDD and mild to moderate canal stenosis L3/L4.  Ortho recommended sling with immobilization LUE and to follow-up with Dr. Doreatha Martin in 2 weeks.  Left thumb was closed reduced and splinted--to be NWB LUE.  Patient has had issues with pain as well as confusion with is resolving. Acute on chronic renal failure with rise in SCr to 2.2 treated with IVF and lotrel d/c.  Foley in place due to urinary retention but cardura has been held intermittently. He was started on urecholine on 6/14 .   Confusion at nights due to sundowning improving with wife staying to provide support and reorientation. Pain control has greatly improved and he is showing improvement in activity tolerance with therapy. CIR recommended due to functional decline.    Review of Systems  Constitutional: Negative for chills and fever.  HENT: Negative for hearing loss and tinnitus.   Eyes: Negative for blurred vision and double vision.    Respiratory: Positive for cough (at nights) and shortness of breath. Negative for wheezing.   Cardiovascular: Negative for chest pain and palpitations.  Gastrointestinal: Negative for heartburn and nausea.  Genitourinary: Positive for hematuria.  Musculoskeletal: Positive for joint pain and myalgias.  Neurological: Positive for sensory change (pain/nerve pain right face/ear). Negative for dizziness and headaches.  Psychiatric/Behavioral: The patient is not nervous/anxious and does not have insomnia.     Past Medical History:  Diagnosis Date  . Allergy   . BPH (benign prostatic hyperplasia)   . BPH (benign prostatic hypertrophy)   . CAD (coronary artery disease)    s/p cypher DES to pLAD 6/08; normal LVF;  ETT-Myoview 2009: no ischemia   . Coronary artery disease   . Eosinophilia   . High cholesterol   . HTN (hypertension)   . Hyperlipidemia   . Hypertension   . MI (myocardial infarction) ([place])   . Myocardial infarction ([place])   . Prediabetes   . Trigeminal neuralgia     Past Surgical History:  Procedure Laterality Date  . APPENDECTOMY    . CARDIAC CATHETERIZATION  07/30/2006   CORONARY ANGIOPLASTY WITH STENT PLACEMENT  . CARDIAC CATHETERIZATION    . EXPLORATORY LAPAROTOMY     age 73  . EXPLORATORY LAPAROTOMY      Family History  Problem Relation Age of Onset  . Stomach cancer Mother   . Heart disease Father   . Heart disease Brother   . Aortic aneurysm Brother   . Colon cancer Brother   . Coronary artery disease Father 29  .  Coronary artery disease Brother   . Aortic aneurysm Brother   . Colon cancer Brother   . Esophageal cancer Neg Hx   . Rectal cancer Neg Hx   . Arthritis Daughter        rheumatoid    Social History:  [name] is off for the summer.  Also has twin step-daughters that are 69 years old that are working from home and can help with discharge.  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 He has  been relatively active. He quit smoking 40 years ago. He has never used smokeless tobacco. He reports that he drink alcohol--4-6 glasses of wine/week.  and does not use drugs.     Allergies  Allergen Reactions  . Gabapentin     Visual changes     Medications Prior to Admission  Medication Sig Dispense Refill  . amLODipine-benazepril (LOTREL) 5-20 MG capsule Take 1 capsule by mouth in the morning and at bedtime.     Marland Kitchen aspirin EC 81 MG tablet Take 81 mg by mouth daily.    Marland Kitchen atorvastatin (LIPITOR) 40 MG tablet Take 40 mg by mouth daily.    . Calcium-Vitamin D 600-200 MG-UNIT tablet Take 1 tablet by mouth 2 (two) times daily.    . diphenhydrAMINE (BENADRYL) 25 MG tablet Take 25 mg by mouth daily as needed.    . doxazosin (CARDURA) 2 MG tablet Take 2 mg by mouth daily.    Marland Kitchen escitalopram (LEXAPRO) 10 MG tablet Take 10 mg by mouth daily.    Marland Kitchen ezetimibe (ZETIA) 10 MG tablet Take 10 mg by mouth daily.    . fluticasone (FLONASE) 50 MCG/ACT nasal spray Place 1 spray into both nostrils daily as needed for allergies or rhinitis.    . Multiple Vitamin (MULTIVITAMIN WITH MINERALS) TABS tablet Take 1 tablet by mouth daily.    . traMADol (ULTRAM) 50 MG tablet Take 50 mg by mouth every 6 (six) hours as needed for moderate pain.      Drug Regimen Review  Drug regimen was reviewed and remains appropriate with no significant issues identified  Home: Home Living Family/patient expects to be discharged to:: Private residence Living Arrangements: Spouse/significant other Available Help at Discharge: Family, Available 24 hours/day Type of Home: House Home Access: Stairs to enter [employer] of Steps: 5 Entrance Stairs-Rails: Right, Left Home Layout: Two level Alternate Level Stairs-Number of Steps: 13 Alternate Level Stairs-Rails: Right Bathroom Shower/Tub: Chiropodist: Standard Home Equipment:  (walking stick)  Lives With: Family   Functional History: Prior  Function Level of Independence: Independent with assistive device(s) Comments: pt utilizes walking stick for community distances  Functional Status:  Mobility: Bed Mobility Overal bed mobility: Needs Assistance Bed Mobility: Supine to Sit Rolling: Mod assist Sidelying to sit: +2 for physical assistance, Max assist Supine to sit: Min assist, HOB elevated Sit to supine: Mod assist Sit to sidelying: +2 for physical assistance, Mod assist, Max assist General bed mobility comments: pt utilizing bed rail and requires PT assist to pivot hips toward edge of bed Transfers Overall transfer level: Needs assistance Equipment used: 1 person hand held assist Transfers: Sit to/from Stand, Stand Pivot Transfers Sit to Stand: Min assist Stand pivot transfers: Min assist General transfer comment: pt requires significant cueing for technique including foot placement, hand placement, utilizing only RUE to push, and rocking to gain forward momentum Ambulation/Gait Ambulation/Gait assistance: Min assist Gait Distance (Feet): 80 Feet (additional trial of  10') Assistive device: IV Pole Gait Pattern/deviations: Step-to pattern, Wide base of support General Gait Details: pt with shortened step to gait pattern, PT providing cues to perform step to gait with L foot leading initially due to discomfort Gait velocity: reduced Gait velocity interpretation: <1.8 ft/sec, indicate of risk for recurrent falls  ADL: ADL Overall ADL's : Needs assistance/impaired Grooming: Minimal assistance, Sitting, Oral care Upper Body Bathing: Maximal assistance, Sitting Lower Body Bathing: Total assistance, Sitting/lateral leans Upper Body Dressing : Maximal assistance, Sitting Lower Body Dressing: Total assistance, Sitting/lateral leans Toilet Transfer: Moderate assistance, +2 for physical assistance, Stand-pivot, BSC Toilet Transfer Details (indicate cue type and reason): MOD A +2 d/t posterior lean during stand pivot  transfer to [employer] to pts L side Toileting- Clothing Manipulation and Hygiene: Total assistance, Sit to/from stand Toileting - Clothing Manipulation Details (indicate cue type and reason): psoterior pericare in standing post BM Functional mobility during ADLs: Moderate assistance, +2 for physical assistance, +2 for safety/equipment General ADL Comments: pt continues to present with hypotension impacting pts progression towards functional mobility goals  Cognition: Cognition Overall Cognitive Status: Impaired/Different from baseline Arousal/Alertness: Awake/alert Orientation Level: Oriented X4 Attention: Sustained Sustained Attention: Impaired Sustained Attention Impairment: Verbal complex Memory: Impaired Memory Impairment: Decreased short term memory, Decreased recall of new information Decreased Short Term Memory: Verbal basic Immediate Memory Recall: Sock, Blue, Bed Memory Recall Sock: With Cue Memory Recall Blue: With Cue Memory Recall Bed: With Cue Awareness: Appears intact Problem Solving: Impaired Problem Solving Impairment: Verbal complex, Functional complex Executive Function: Writer: Impaired Organizing Impairment: Verbal complex Safety/Judgment: Appears intact Cognition Arousal/Alertness: Awake/alert Behavior During Therapy: WFL for tasks assessed/performed Overall Cognitive Status: Impaired/Different from baseline Area of Impairment: Memory, Safety/judgement, Problem solving Orientation Level: Disoriented to, Situation Current Attention Level: Sustained Memory: Decreased short-term memory Following Commands: Follows one step commands consistently, Follows multi-step commands with increased time Safety/Judgement: Decreased awareness of safety, Decreased awareness of deficits Awareness: Emergent Problem Solving: Slow processing, Requires verbal cues General Comments: Pt more alert today with decreased confusion during session. Oriented x 4. Confusion  more noted with fatigue.  Blood pressure (!) 144/61, pulse 86, temperature 98 F (36.7 C), temperature source Oral, resp. rate 16, height 5\' 8"  (1.727 m), weight 98 kg, SpO2 98 %.  Physical Exam  Nursing note and vitals reviewed. Constitutional: He is oriented to person, place, and time.  Sitting upright in a chair with sling LUE. Alert and appropriate.   Respiratory: He has wheezes (RLL).  GI: Normal appearance.  Musculoskeletal:     Comments: Left shoulder and upper back with resolving ecchymosis. Left thumb with dressing and splint in place. Min pedal edema.   Neurological: He is alert and oriented to person, place, and time.  Alert--brighter today and not distracted or repetitive. Able answer orientation questions without difficulty. Was able to state today's date as well as the fact that he remembered being disoriented in the middle of the night. Impaired recall.  RUE: 4/5 strength LUE in sling LLE: 3/5 HF, KE, DF 1/5  Results for orders placed or performed during the hospital encounter of 07/23/19 (from the past 48 hour(s))  Glucose, capillary     Status: Abnormal   Collection Time: 07/23/19  4:55 PM  Result Value Ref Range   Glucose-Capillary 134 (H) 70 - 99 mg/dL    Comment: Glucose reference range applies only to samples taken after fasting for at least 8 hours.   No results found.     Medical Problem  List and Plan: 1.  Impaired secondary to TBI  -patient may shower  -ELOS/Goals: S with PT, OT, and SLP 2.  Antithrombotics: -DVT/anticoagulation:  Pharmaceutical: Lovenox  -antiplatelet therapy:  3. Pain Management: Due to age will decrease tylenol to 650 mg qid. Add ultram prn for moderate to severe pain (was taking at home) D/c robaxin as may be causing cognitive issues.  4. Mood: LCSW to follow for evaluation and support.   -antipsychotic agents: N/a 5. Neuropsych: This patient is capable of making decisions on his own behalf. 6. Skin/Wound Care: Routine pressure  relief measures.  7. Fluids/Electrolytes/Nutrition: Monitor I/O. Check lytes in am. 8. HTN: Monitor BP tid--continue cardura. Lotrel on hold.  9. Left thumb dislocation: s/p relocation. Continue spica splint 10. Distal clavicle fracture- continue sling 11. New onset diabetes: Hgb A1c-6.5--Carb modified diet. Will monitor BS ac/hs for now.  12. Acute urinary retention: 13.  ABLA:H/H on downward trend but likely due to hydration--continue to monitor.  Recheck labs in am.  14. Acute renal failure on chronic renal failure: SCr 1.97 at admisison: Resolving---d/c IVF to avoid overload. Will order daily weights.  K+ trending upward--recheck in a.m.   15. Constipation: Patient reporting hard balls this am. Will start laxative for bowel regimen. 16. Impaired cognition: improving but as per wife and patient he is not yet at baseline.   Reesa Chew, PA-C  Izora Ribas, MD 07/23/2019   I have personally performed a face to face diagnostic evaluation, including, but not limited to relevant history and physical exam findings, of this patient and developed relevant assessment and plan.  Additionally, I have reviewed and concur with the physician assistant's documentation above.  The patient's status has not changed. The original post admission physician evaluation remains appropriate, and any changes from the pre-admission screening or documentation from the acute chart are noted above.   Leeroy Cha, MD

## 2019-07-23 NOTE — Plan of Care (Signed)

## 2019-07-23 NOTE — Progress Notes (Signed)
Patient ID: [name], male   DOB: 1933/09/19, 86 y.o.   MRN: [id number]     Subjective: Doing a little better Hoping for CIR C/O constipation ROS negative except as listed above. Objective: Vital signs in last 24 hours: Temp:  [97.5 F (36.4 C)-98.6 F (37 C)] 97.9 F (36.6 C) (06/17 0745) Pulse Rate:  [76-106] 96 (06/17 0745) Resp:  [12-22] 16 (06/17 0745) BP: (126-158)/(50-76) 131/68 (06/17 0745) SpO2:  [93 %-99 %] 99 % (06/17 0745) Last BM Date: 07/22/19  Intake/Output from previous day: 06/16 0701 - 06/17 0700 In: 630 [P.O.:630] Out: 2250 [Urine:2250] Intake/Output this shift: No intake/output data recorded.  General appearance: alert and cooperative Resp: clear to auscultation bilaterally Cardio: regular rate and rhythm GI: soft, non-tender; bowel sounds normal; no masses,  no organomegaly Extremities: sling LUE Neurologic: Mental status: Alert, oriented, thought content appropriate  Lab Results: CBC  No results for input(s): WBC, HGB, HCT, PLT in the last 72 hours. BMET Recent Labs    07/22/19 1632  NA 136  K 5.0  CL 106  CO2 20*  GLUCOSE 153*  BUN 29*  CREATININE 1.44*  CALCIUM 8.2*   PT/INR No results for input(s): LABPROT, INR in the last 72 hours. ABG No results for input(s): PHART, HCO3 in the last 72 hours.  Invalid input(s): PCO2, PO2  Studies/Results: No results found.  Anti-infectives: Anti-infectives (From admission, onward)   None      Assessment/Plan: Fall  Distal clavicle fx- orthorecommends non op management with sling. Left thumb fx dislocation - orthohandrecommends non op management. fx relocated and currently inthumb spica splint TBI/SAH-NSGYwithno plans for repeat head scan unless he changes neurologically.Slight forgetfulness at times, but stable. TBI therapies ordered Left posterior 1-7 rib fxwith trace PTX- repeat CXRstable with no PTX. No SOB today and sats good. Pain control. PT/OT for  mobilization ABL anemia secondary to paraspinal muscle hematoma and abdominal wall contusion ? Perinephric contusion- UA negative for blood.  AKI-stabilized Neck Pain-appreciate NSGY input. MRI c-spine negative.  HTN- holding home meds today due to symptomatic relative hypotension  Prediabetes - hgbA1c of 6.5. Carb mod diet and SSI as needed FEN-fleet X 1 Acute urinary retention - foley plan 48h, urecholine, on doxazosin VTE-Lovenox 30mg  BID ID -none currently needed Dispo - 4NP, pending d/c to CIR (initially denied by insurance) - he has reached out to an executive at his insurance company. D/C tele  I spoke with his wife at the bedside.    LOS: 9 days    Georganna Skeans, MD, MPH, FACS Trauma & General Surgery Use [email or web address] to contact on call provider  07/23/2019

## 2019-07-23 NOTE — Progress Notes (Signed)
Jamse Arn, MD  Physician  Physical Medicine and Rehabilitation  Consult Note      Signed  Date of Service:  07/17/2019 10:55 AM      Related encounter: ED to Hosp-Admission (Discharged) from 07/14/2019 in [place]      Signed      Expand All Collapse All  Show:Clear all [x] Manual[x] Template[] Copied  Added by: [x] Love, Ivan Anchors, PA-C[x] Jamse Arn, MD  [] Hover for details          Physical Medicine and Rehabilitation Consult     Reason for Consult:Functional deficits due to fall with TBI Referring Physician: Dr. Grandville Silos   HPI: [name] is a 86 y.o. male with history of BPH, CAD, HTN, CKD III prediabetes, prior falls; who was admitted on 07/14/2019 after unwitnessed fall down some stairs with amnesia of events and had reports of headache, back and chest pain.  History taken from chart review, patient, and wife due cognition.  Head CT personally reviewed, showing small left SAH.  Work-up revealed small volume SAH on left with scalp contusion, trace left PTX with left 1st-7th rib fractures, mild LUL contusion, 3.1 cm AAA, distal clavicle fracture, dislocation of left interphalangeal joint fourth digit and possible avulsion fracture of MCP joint with advanced degenerative changes first MCP.  Dr. Cyndy Freeze recommended conservative management with repeat CT head if neurological decline noted.  Cervical spine CT showed mild multilevel DDD. CT lumbar spine showed contusion to left flank and paraspinal musculature with multilevel DDD and mild to moderate canal stenosis at L3/L4. Ortho recommended short period of sling immobilization. LUE and left thumb closed reduced and splinted--to follow up with Dr. Doreatha Martin in 2 weeks. Hard collar in place pending flex/extension views. Is NWB in LUE. Therapy evaluations completed and patient noted to have unsteady gait with decreased activity tolerance, cognitive deficits with problems difficulty processing simple  command, poor recall and decreased problem solving. Wife reports some issues with mild intermittent confusion and memory last year since isolation for pandemic, however improving since admission to the hospital. CIR recommended due to functional decline.  Hospital course was complicated by hyperglycemia as as well as ABLA and AKI.  Patient with associated numbness as well.  Case discussed with insurance MD as well.    Review of Systems  Unable to perform ROS: Other  Limited due to cognition       Past Medical History:  Diagnosis Date  . BPH (benign prostatic hyperplasia)    . Coronary artery disease    . High cholesterol    . Hypertension    . MI (myocardial infarction) ([place])    . Prediabetes             Past Surgical History:  Procedure Laterality Date  . APPENDECTOMY      . CARDIAC CATHETERIZATION      . EXPLORATORY LAPAROTOMY               Family History  Problem Relation Age of Onset  . Stomach cancer Mother    . Heart disease Father    . Heart disease Brother    . Aortic aneurysm Brother    . Colon cancer Brother        Social History:  [name] is off for the summer.  Also has twin daughters that are 4 years old that are working from home and can help with discharge.  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 He has been relatively active. He quit smoking 40 years ago. He has never used smokeless tobacco. He reports that he drink alcohol--4-6 glasses of wine/week.  and does not use drugs.      Allergies: No Known Allergies            Medications Prior to Admission  Medication Sig Dispense Refill  . amLODipine-benazepril (LOTREL) 5-20 MG capsule Take 1 capsule by mouth in the morning and at bedtime.       Marland Kitchen aspirin EC 81 MG tablet Take 81 mg by mouth daily.      Marland Kitchen atorvastatin (LIPITOR) 40 MG tablet Take 40 mg by mouth daily.      . Calcium-Vitamin D 600-200 MG-UNIT tablet Take 1 tablet by mouth 2 (two) times  daily.      . diphenhydrAMINE (BENADRYL) 25 MG tablet Take 25 mg by mouth daily as needed.      . doxazosin (CARDURA) 2 MG tablet Take 2 mg by mouth daily.      Marland Kitchen escitalopram (LEXAPRO) 10 MG tablet Take 10 mg by mouth daily.      Marland Kitchen ezetimibe (ZETIA) 10 MG tablet Take 10 mg by mouth daily.      . fluticasone (FLONASE) 50 MCG/ACT nasal spray Place 1 spray into both nostrils daily as needed for allergies or rhinitis.      . Multiple Vitamin (MULTIVITAMIN WITH MINERALS) TABS tablet Take 1 tablet by mouth daily.      . traMADol (ULTRAM) 50 MG tablet Take 50 mg by mouth every 6 (six) hours as needed for moderate pain.          Home: Home Living Family/patient expects to be discharged to:: Private residence Living Arrangements: Spouse/significant other Available Help at Discharge: Family, Available 24 hours/day Type of Home: House Home Access: Stairs to enter [employer] of Steps: 5 Entrance Stairs-Rails: Right, Left Home Layout: Two level Alternate Level Stairs-Number of Steps: 13 Alternate Level Stairs-Rails: Right Bathroom Shower/Tub: Chiropodist: Standard Home Equipment:  (walking stick)  Functional History: Prior Function Level of Independence: Independent with assistive device(s) Comments: pt utilizes walking stick for community distances Functional Status:  Mobility: Bed Mobility Overal bed mobility: Needs Assistance Bed Mobility: Rolling, Sidelying to Sit Rolling: Max assist Sidelying to sit: Max assist, HOB elevated General bed mobility comments: OOB in recliner upon entry  Transfers Overall transfer level: Needs assistance Equipment used: 1 person hand held assist Transfers: Sit to/from Stand, Stand Pivot Transfers Sit to Stand: Min assist, From elevated surface Stand pivot transfers: Mod assist General transfer comment: deferred due to fatigue and pain    ADL: ADL Overall ADL's : Needs assistance/impaired Grooming: Minimal  assistance, Sitting, Oral care Upper Body Bathing: Maximal assistance, Sitting Lower Body Bathing: Total assistance, Sitting/lateral leans Upper Body Dressing : Maximal assistance, Sitting Lower Body Dressing: Total assistance, Sitting/lateral leans Toilet Transfer Details (indicate cue type and reason): deferred Functional mobility during ADLs: Maximal assistance, Cueing for safety, Cueing for sequencing General ADL Comments: pt limited by pain, decreased functional use of L UE, impaired balance, and cognition   Cognition: Cognition Overall Cognitive Status: Impaired/Different from baseline Orientation Level: Oriented X4 Cognition Arousal/Alertness: Awake/alert Behavior During Therapy: WFL for tasks assessed/performed Overall Cognitive Status: Impaired/Different from baseline Area of Impairment: Orientation, Attention, Memory, Safety/judgement, Awareness, Problem solving Orientation Level: Disoriented to, Place Current Attention Level: Sustained Memory: Decreased recall of precautions, Decreased short-term memory Safety/Judgement: Decreased awareness of safety, Decreased awareness of deficits Awareness:  Emergent Problem Solving: Slow processing, Requires verbal cues, Difficulty sequencing General Comments: patient re-oriented to place, repeats self several times during session, reports recall of situation only from what his spouse has told him, follows commands with incresed time      Blood pressure (!) 98/41, pulse 80, temperature 98 F (36.7 C), temperature source Oral, resp. rate 12, height 5\' 7"  (1.702 m), weight 99.8 kg, SpO2 97 %. Physical Exam  Nursing note and vitals reviewed. Constitutional: He is cooperative. No distress.  Sitting in a chair --slumped to the right. Dried blood left scalp. C-Collar in place. LUE in sling and splint on left hand--limited by pain.   HENT:  Head: Normocephalic.  Right Ear: External ear normal.  Left Ear: External ear normal.  Mouth/Throat:  Mucous membranes are dry. No oropharyngeal exudate.  Eyes: Right eye exhibits no discharge. Left eye exhibits no discharge.  Respiratory: Effort normal. No respiratory distress.  GI: Soft. Normal appearance.  Musculoskeletal:     Cervical back: Normal range of motion and neck supple.     Comments: Left medial ankle with discomfort on ROM Left upper extremity with edema and tenderness  Neurological: He is alert.  Speech clear.  Able to answer biographic questions and follow simple commands without difficulty but tends to repeat himself at times.  Alert and oriented x2 with increased time Confusion Motor: Right upper extremity: 4/5 proximal distal (pain inhibition) Left upper extremity controlled abduction, elbow flexion/extension 3 -/5, wrist braced (pain inhibition)   right lower extremity: 4+/5 proximal distal Left lower extremity: Hip flexion, knee extension 2 -/5, ankle dorsiflexion 1/5 (pain inhibition) Sensation diminished light touch left foot  Skin: Skin is warm and dry.  Left wrist with brace C/D/I  Psychiatric: Affect normal. His speech is tangential.  Confused      Lab Results Last 24 Hours       Results for orders placed or performed during the hospital encounter of 07/14/19 (from the past 24 hour(s))  Glucose, capillary     Status: Abnormal    Collection Time: 07/16/19  4:38 PM  Result Value Ref Range    Glucose-Capillary 154 (H) 70 - 99 mg/dL  Glucose, capillary     Status: Abnormal    Collection Time: 07/16/19  9:31 PM  Result Value Ref Range    Glucose-Capillary 130 (H) 70 - 99 mg/dL  CBC     Status: Abnormal    Collection Time: 07/17/19  5:56 AM  Result Value Ref Range    WBC 7.3 4.0 - 10.5 K/uL    RBC 2.60 (L) 4.22 - 5.81 MIL/uL    Hemoglobin 8.4 (L) 13.0 - 17.0 g/dL    HCT 25.9 (L) 39 - 52 %    MCV 99.6 80.0 - 100.0 fL    MCH 32.3 26.0 - 34.0 pg    MCHC 32.4 30.0 - 36.0 g/dL    RDW 12.9 11.5 - 15.5 %    Platelets 146 (L) 150 - 400 K/uL    nRBC 0.0 0.0  - 0.2 %  Basic metabolic panel     Status: Abnormal    Collection Time: 07/17/19  5:56 AM  Result Value Ref Range    Sodium 137 135 - 145 mmol/L    Potassium 4.7 3.5 - 5.1 mmol/L    Chloride 105 98 - 111 mmol/L    CO2 25 22 - 32 mmol/L    Glucose, Bld 123 (H) 70 - 99 mg/dL    BUN 41 (H)  8 - 23 mg/dL    Creatinine, Ser 2.20 (H) 0.61 - 1.24 mg/dL    Calcium 8.3 (L) 8.9 - 10.3 mg/dL    GFR calc non Af Amer 27 (L) >60 mL/min    GFR calc Af Amer 31 (L) >60 mL/min    Anion gap 7 5 - 15  Glucose, capillary     Status: Abnormal    Collection Time: 07/17/19  8:02 AM  Result Value Ref Range    Glucose-Capillary 127 (H) 70 - 99 mg/dL  Glucose, capillary     Status: Abnormal    Collection Time: 07/17/19 11:27 AM  Result Value Ref Range    Glucose-Capillary 143 (H) 70 - 99 mg/dL       Imaging Results (Last 48 hours)  DG Ankle Complete Left   Result Date: 07/16/2019 CLINICAL DATA:  Lateral ankle pain and swelling after fall 2 days ago. EXAM: LEFT ANKLE COMPLETE - 3+ VIEW COMPARISON:  None. FINDINGS: No acute fracture or dislocation. The ankle mortise is symmetric. The talar dome is intact. No tibiotalar joint effusion. Joint spaces are preserved. Bone mineralization is normal. Diffuse soft tissue swelling. IMPRESSION: 1. Diffuse soft tissue swelling. No acute osseous abnormality. Electronically Signed   By: Titus Dubin M.D.   On: 07/16/2019 09:27    MR CERVICAL SPINE WO CONTRAST   Result Date: 07/17/2019 CLINICAL DATA:  Neck trauma with midline tenderness. EXAM: MRI CERVICAL SPINE WITHOUT CONTRAST TECHNIQUE: Multiplanar, multisequence MR imaging of the cervical spine was performed. No intravenous contrast was administered. COMPARISON:  CT of the cervical spine July 14, 2019 FINDINGS: Alignment: Physiologic. Vertebrae: No fracture, evidence of discitis, or bone lesion. Cord: Normal signal and morphology. Posterior Fossa, vertebral arteries, paraspinal tissues: Absent flow void within the distal  V2 segment of the left vertebral artery, may represent slow flow versus occlusion. Disc levels: C2-3: Small posterior disc protrusion causing minimal indentation thecal sac without significant spinal canal stenosis. Uncovertebral and facet degenerative changes resulting in mild to moderate left neural foraminal narrowing. C3-4: Posterior disc protrusion causing mild spinal canal stenosis. Uncovertebral and facet degenerative changes resulting in mild right and moderate left neural foraminal narrowing. C4-5: Posterior disc osteophyte complex resulting in mild spinal canal stenosis. Uncovertebral and facet degenerative changes resulting in mild-to-moderate right and mild left neural foraminal narrowing. C5-6: Small posterior disc protrusion causing mild spinal canal stenosis. Uncovertebral and facet degenerative changes resulting in moderate right neural foraminal narrowing. C6-7: Posterior disc protrusion causing small indentation of the thecal sac without significant spinal canal stenosis. Uncovertebral and facet degenerative changes result in moderate right neural foraminal narrowing. C7-T1: No spinal canal or neural foraminal stenosis. IMPRESSION: 1. No acute osseous abnormality or abnormal cord signal. 2. Cervical spondylosis with multilevel posterior disc protrusions resulting in mild spinal canal stenosis from C3-4 through C5-6. 3. Moderate neural foraminal narrowing on the left at C2-3 and C3-4 and on the right at C5-6 and C6-7. 4. Absent flow void within the distal V2 segment of the left vertebral artery, may represent slow flow versus occlusion. Electronically Signed   By: Pedro Earls M.D.   On: 07/17/2019 08:42    DG Chest Port 1 View   Result Date: 07/16/2019 CLINICAL DATA:  Pneumothorax and left chest pain EXAM: PORTABLE CHEST 1 VIEW COMPARISON:  08/13/2019 FINDINGS: Cardiomegaly. Improved aeration at the left base. No visible pneumothorax. Known left rib fractures. IMPRESSION: No  visible pneumothorax.  Mildly improved aeration. Electronically Signed   By: Angelica Chessman  Watts M.D.   On: 07/16/2019 07:57       Assessment/Plan: Diagnosis: TBI with polytrauma             [place] score:  >VI             Speech to evaluate for Post traumatic amnesia and interval GOAT scores to assess progress.             NeuroPsych evaluation for behavorial assessment.             Provide environmental management by reducing the level of stimulation, tolerating restlessness when possible, protecting patient from harming self or others and reducing patient's cognitive confusion.             Address behavioral concerns include providing structured environments and daily routines.             Cognitive therapy to direct modular abilities in order to maintain goals        including problem solving, self regulation/monitoring, self management, attention, and memory.             Fall precautions; pt at risk for second impact syndrome             Prevention of secondary injury: monitor for hypotension, hypoxia, seizures or signs of increased ICP             Prophylactic AED:              PT/OT consults for mobility strengthening, endurance training and adaptive ADLs              Avoid medications that could impair cognitive abilities, such as anticholinergics, antihistaminic, benzodiazapines, narcotics, etc when possible Labs and images (see above) independently reviewed.  Records reviewed and summated above.   1. Does the need for close, 24 hr/day medical supervision in concert with the patient's rehab needs make it unreasonable for this patient to be served in a less intensive setting? Yes  2. Co-Morbidities requiring supervision/potential complications: BPH, CAD, HTN (monitor and provide prns in accordance with increased physical exertion and pain), AKI on CKD III (repeat labs, avoid nephrotoxic meds), prediabetes (Monitor in accordance with exercise and adjust meds as necessary), prior  falls, ABLA (repeat labs, consider transfusion if necessary to ensure appropriate perfusion for increased activity tolerance), thrombocytopenia (repeat labs) 3. Due to safety, disease management, medication administration and patient education, does the patient require 24 hr/day rehab nursing? Yes 4. Does the patient require coordinated care of a physician, rehab nurse, therapy disciplines of PT/OT/SLP to address physical and functional deficits in the context of the above medical diagnosis(es)? Yes Addressing deficits in the following areas: balance, endurance, locomotion, strength, transferring, bathing, dressing, toileting, cognition and psychosocial support 5. Can the patient actively participate in an intensive therapy program of at least 3 hrs of therapy per day at least 5 days per week? Yes 6. The potential for patient to make measurable gains while on inpatient rehab is excellent 7. Anticipated functional outcomes upon discharge from inpatient rehab are supervision and min assist  with PT, supervision and min assist with OT, modified independent and supervision with SLP. 8. Estimated rehab length of stay to reach the above functional goals is: 15-18 days. 9. Anticipated discharge destination: Home 10. Overall Rehab/Functional Prognosis: good   RECOMMENDATIONS: This patient's condition is appropriate for continued rehabilitative care in the following setting: CIR Patient has agreed to participate in recommended program. Yes Note that insurance prior authorization may be required for  reimbursement for recommended care.   Comment: Rehab Admissions Coordinator to follow up.   I have personally performed a face to face diagnostic evaluation, including, but not limited to relevant history and physical exam findings, of this patient and developed relevant assessment and plan.  Additionally, I have reviewed and concur with the physician assistant's documentation above.    Delice Lesch, MD,  ABPMR Bary Leriche, PA-C 07/17/2019        Revision History                                    Routing History           Note Details  Author Jamse Arn, MD File Time 07/17/2019 11:58 AM  Author Type Physician Status Signed  Last Editor Jamse Arn, MD Service Physical Medicine and [place] # [id number] Admit Date 07/23/2019

## 2019-07-23 NOTE — Plan of Care (Signed)
0

## 2019-07-23 NOTE — Progress Notes (Signed)
Pt arrived to unit via bed, pt is alert to self, place, situation, but not to time. Pt oriented to rehab. Enema gvn prior to transport with good results upon arrival.

## 2019-07-23 NOTE — Progress Notes (Signed)
Raechel Ache, OT  Rehab Admission Coordinator  Physical Medicine and Rehabilitation  PMR Pre-admission      Signed  Date of Service:  07/16/2019  5:24 PM      Related encounter: ED to Hosp-Admission (Discharged) from 07/14/2019 in [place]       Show:Clear all _0 Manual_1 Template_2 Copied  Added by: _3 Raechel Ache, OT  _4 Hover for details PMR Admission Coordinator Pre-Admission Assessment   Patient: [name] is an 86 y.o., male MRN: [id number] DOB: 02-Aug-1933 Height: _5  (170.2 cm) Weight: 99.8 kg                                                                                                                                                  Insurance Information HMO:     PPO: yes     PCP:      IPA:      80/20:      OTHER:  PRIMARY: HTA      Policy#: [id number]      Subscriber: patient CM Name: Crystal       Phone#: [phone]     Fax#: [phone]   Pre-Cert#: Josem Kaufmann [id number]      Employer:  Josem Kaufmann provided by Donella Stade for admit to CIR. Approval was given by Crystal after expedited appeal was overturned in the patient's favor. Auth approval number provided is [id number]. Pt is approved for 7 days. HTA has epic access. For questions (p): [phone] Benefits:  Phone #: [phone] 1     Name: Samule Dry ref # [id number] Eff. Date: 02/05/2014-02-05-2020     Deduct: no deductible ($0)      Out of Pocket Max: $3,400 ($10 met)      Life Max:   CIR: $295/day co-pay for days 1-6; $0/day for days 7-90      SNF: $50/day co-pay for days 1-20, $178/day co-pay for days 21-100; limited to 100 days/benefit period Outpatient: $15/visit co-pay; limited by medical necessity    Home Health: 100% coverage, 0% co-insurance, $0 co-pay; limited by medical necessity    DME: 80% coverage, 20% co-insurance     Providers:  SECONDARY: None      Policy#:       Phone#:    Development worker, community:       Phone#:    The Therapist, art Information  Summary" for patients in Inpatient Rehabilitation Facilities with attached "Privacy Act [place] Records" was provided and verbally reviewed with: Patient and Family   Emergency Contact Information         Contact Information     Name Relation Home Work [place] Spouse     [phone]       Current Medical History  Patient Admitting Diagnosis: TBI with polytrauma   History of Present Illness: [name]  is a 86 y.o. male with history of BPH, CAD, HTN, CKD III prediabetes, prior falls; who was admitted on 07/14/2019 after unwitnessed fall down some stairs with amnesia of events and had reports of headache, back and chest pain. Head CT personally reviewed, showing small left SAH.  Work-up revealed small volume SAH on left with scalp contusion, trace left PTX with left 1st-7th rib fractures, mild LUL contusion, 3.1 cm AAA, distal clavicle fracture, dislocation of left interphalangeal joint fourth digit and possible avulsion fracture of MCP joint with advanced degenerative changes first MCP.  Dr. Cyndy Freeze recommended conservative management with repeat CT head if neurological decline noted.  Cervical spine CT showed mild multilevel DDD. CT lumbar spine showed contusion to left flank and paraspinal musculature with multilevel DDD and mild to moderate canal stenosis at L3/L4. Ortho recommended short period of sling immobilization. LUE and left thumb closed reduced and splinted--to follow up with Dr. Doreatha Martin in 2 weeks. Hard collar in place pending flex/extension views. Is NWB in LUE. Therapy evaluations completed and patient noted to have unsteady gait with decreased activity tolerance, cognitive deficits with problems difficulty processing simple command, poor recall and decreased problem solving. Wife reports some issues with mild intermittent confusion and memory last year since isolation for pandemic, however improving since admission to the hospital. CIR recommended due  to functional decline.  Hospital course was complicated by hyperglycemia as as well as ABLA and AKI.  Patient with associated numbness as well.  Case discussed with insurance MD as well. Pt continues to have therapy needs. Pt has now been approved for CIR after expedited appeal process. Pt is to admit to CIR on 07/23/19. Glasgow Coma Scale Score: 15   Past Medical History  Past Medical History:  Diagnosis Date  . BPH (benign prostatic hyperplasia)    . Coronary artery disease    . High cholesterol    . Hypertension    . MI (myocardial infarction) ([place])    . Prediabetes        Family History  family history includes Aortic aneurysm in his brother; Colon cancer in his brother; Heart disease in his brother and father; Stomach cancer in his mother.   Prior Rehab/Hospitalizations:  Has the patient had prior rehab or hospitalizations prior to admission? No   Has the patient had major surgery during 100 days prior to admission? No   Current Medications    Current Facility-Administered Medications:  .  0.9 %  sodium chloride infusion, , Intravenous, Continuous, Georganna Skeans, MD, Last Rate: 10 mL/hr at 07/20/19 0934, Rate Change at 07/20/19 0934 .  acetaminophen (TYLENOL) tablet 1,000 mg, 1,000 mg, Oral, Q6H, Lovick, Ayesha N, MD, 1,000 mg at 07/23/19 0512 .  albuterol (PROVENTIL) (2.5 MG/3ML) 0.083% nebulizer solution 2.5 mg, 2.5 mg, Nebulization, Q4H PRN, Joselyn Glassman A, RPH .  bethanechol (URECHOLINE) tablet 25 mg, 25 mg, Oral, TID, Georganna Skeans, MD, 25 mg at 07/23/19 0910 .  Chlorhexidine Gluconate Cloth 2 % PADS 6 each, 6 each, Topical, Daily, Jesusita Oka, MD, 6 each at 07/22/19 0941 .  doxazosin (CARDURA) tablet 2 mg, 2 mg, Oral, Daily, Saverio Danker, PA-C, 2 mg at 07/23/19 0910 .  enoxaparin (LOVENOX) injection 30 mg, 30 mg, Subcutaneous, Q12H, Saverio Danker, PA-C, 30 mg at 07/23/19 0234 .  escitalopram (LEXAPRO) tablet 10 mg, 10 mg, Oral, Daily, Saverio Danker, PA-C, 10  mg at 07/23/19 0910 .  fluticasone (FLONASE) 50 MCG/ACT nasal spray 1 spray, 1 spray, Each Nare, Daily PRN,  Saverio Danker, PA-C .  ibuprofen (ADVIL) tablet 400 mg, 400 mg, Oral, Q6H PRN, Georganna Skeans, MD, 400 mg at 07/23/19 0909 .  insulin aspart (novoLOG) injection 0-9 Units, 0-9 Units, Subcutaneous, TID WC, Saverio Danker, PA-C, 2 Units at 07/23/19 0912 .  lactated ringers infusion, , Intravenous, Continuous, Jesusita Oka, MD, Last Rate: 63 mL/hr at 07/22/19 0933, New Bag at 07/22/19 0933 .  lidocaine (LIDODERM) 5 % 1 patch, 1 patch, Transdermal, Q24H, Jesusita Oka, MD, 1 patch at 07/23/19 0514 .  methocarbamol (ROBAXIN) tablet 1,000 mg, 1,000 mg, Oral, TID, Jesusita Oka, MD, 1,000 mg at 07/23/19 0910 .  ondansetron (ZOFRAN-ODT) disintegrating tablet 4 mg, 4 mg, Oral, Q6H PRN **OR** ondansetron (ZOFRAN) injection 4 mg, 4 mg, Intravenous, Q6H PRN, Rolm Bookbinder, MD, 4 mg at 07/15/19 1113 .  sodium phosphate (FLEET) 7-19 GM/118ML enema 1 enema, 1 enema, Rectal, Once, Georganna Skeans, MD   Patients Current Diet:     Diet Order                      Diet Carb Modified Fluid consistency: Thin; Room service appropriate? 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 no AD, Independent PTA   Prior Functional Level Prior Function Level of Independence: Independent with assistive device(s) Comments: pt  utilizes walking stick for community distances   Self Care: Did the patient need help bathing, dressing, using the toilet or eating?  Independent   Indoor Mobility: Did the patient need assistance with walking from room to room (with or without device)? Independent   Stairs: Did the patient need assistance with internal or external stairs (with or without device)? Independent   Functional Cognition: Did the patient need help planning regular tasks such as shopping or remembering to take medications? [place] / Equipment Home Equipment:  (walking stick)   Prior Device Use: Indicate devices/aids used by the patient prior to current illness, exacerbation or injury? would use walking stick during walks in neighborhood   Current Functional Level Cognition   Arousal/Alertness: Awake/alert Overall Cognitive Status: Impaired/Different from baseline Current Attention Level: Sustained Orientation Level: Oriented X4 Following Commands: Follows one step commands consistently, Follows multi-step commands with increased time Safety/Judgement: Decreased awareness of safety, Decreased awareness of deficits General Comments: Pt more alert today with decreased confusion during session. Oriented x 4. Confusion more noted with fatigue. Attention: Sustained Sustained Attention: Impaired Sustained Attention Impairment: Verbal complex Memory: Impaired Memory Impairment: Decreased short term memory, Decreased recall of new information Decreased Short Term Memory: Verbal basic Awareness: Appears intact Problem Solving: Impaired Problem Solving Impairment: Verbal complex, Functional complex Executive Function: Organizing Organizing: Impaired Organizing Impairment: Verbal complex Safety/Judgment: Appears intact    Extremity Assessment (includes Sensation/Coordination)   Upper Extremity Assessment: LUE deficits/detail LUE Deficits / Details: in sling with thumb spica splint;  deferred ROM  due to pain  LUE: Unable to fully assess due to pain LUE Coordination: decreased gross motor, decreased fine motor  Lower Extremity Assessment: Defer to PT evaluation LLE Deficits / Details: L weakness more significant with ecchymosis noted at L hip and ankle. Pain with WB through LLE. Grossly 3/5     ADLs   Overall ADL's : Needs assistance/impaired Grooming: Minimal assistance, Sitting, Oral care Upper Body Bathing: Maximal assistance, Sitting Lower Body Bathing: Total assistance, Sitting/lateral leans Upper Body Dressing : Maximal assistance, Sitting Lower Body Dressing: Total assistance, Sitting/lateral leans Toilet Transfer: Moderate assistance, +2 for physical assistance, Stand-pivot, BSC Toilet Transfer Details (indicate cue type and reason): MOD A +2 d/t posterior lean during stand pivot transfer to [facility] to pts L side Toileting- Clothing Manipulation and Hygiene: Total assistance, Sit to/from stand Toileting - Clothing Manipulation Details (indicate cue type and reason): psoterior pericare in standing post BM Functional mobility during ADLs: Moderate assistance, +2 for physical assistance, +2 for safety/equipment General ADL Comments: pt continues to present with hypotension impacting pts progression towards functional mobility goals     Mobility   Overal bed mobility: Needs Assistance Bed Mobility: Supine to Sit Rolling: Mod assist Sidelying to sit: +2 for physical assistance, Max assist Supine to sit: Min assist, HOB elevated Sit to supine: Mod assist Sit to sidelying: +2 for physical assistance, Mod assist, Max assist General bed mobility comments: pt utilizing bed rail and requires PT assist to pivot hips toward edge of bed     Transfers   Overall transfer level: Needs assistance Equipment used: 1 person hand held assist Transfers: Sit to/from Stand, Stand Pivot Transfers Sit to Stand: Min assist Stand pivot transfers: Min assist General transfer comment: pt  requires significant cueing for technique including foot placement, hand placement, utilizing only RUE to push, and rocking to gain forward momentum     Ambulation / Gait / Stairs / Wheelchair Mobility   Ambulation/Gait Ambulation/Gait assistance: Herbalist (Feet): 80 Feet (additional trial of 10') Assistive device: IV Pole Gait Pattern/deviations: Step-to pattern, Wide base of support General Gait Details: pt with shortened step to gait pattern, PT providing cues to perform step to gait with L foot leading initially due to discomfort Gait velocity: reduced Gait velocity interpretation: <1.8 ft/sec, indicate of risk for recurrent falls     Posture / Balance Dynamic Sitting Balance Sitting balance - Comments: supervision Balance Overall balance assessment: Needs assistance Sitting-balance support: No upper extremity supported, Feet supported Sitting balance-Leahy Scale: Good Sitting balance - Comments: supervision Standing balance support: Single extremity supported Standing balance-Leahy Scale: Fair Standing balance comment: minG with unilateral UE support of IV pole     Special needs/care consideration Skin: abrasion to posterior head, ecchymosis to back, flank (right, left, lower), skin tear to left elbow, Behavioral consideration: wife reports confusion at night/in AM  Designated visitor: wife [name])        Previous Home Environment (from acute therapy documentation) Living Arrangements: Spouse/significant other  Lives With: Family Available Help at Discharge: Family, Available 24 hours/day Type of Home: House Home Layout: Two level Alternate Level Stairs-Rails: Right Alternate Level Stairs-Number of Steps: 13 Home Access: Stairs to enter Entrance Stairs-Rails: Right, Left Entrance Stairs-Number of Steps: 5 Bathroom Shower/Tub: Chiropodist: Standard   Discharge Living Setting Plans for Discharge Living Setting: House, Lives with  (comment) (wife and two grown daughters) Type of Home at Discharge: House Discharge Home Layout: Two level, Bed/bath upstairs Alternate Level Stairs-Rails: Can  reach both (only halfway) Alternate Level Stairs-Number of Steps: 13 Discharge Home Access: Stairs to enter Entrance Stairs-Rails: Right, Left Entrance Stairs-Number of Steps: 5 Discharge Bathroom Shower/Tub: Tub/shower unit Discharge Bathroom Toilet: Standard Discharge Bathroom Accessibility: Yes (but not in master bath (in other hallway bathroom it may fit) How Accessible: Accessible via walker Does the patient have any problems obtaining your medications?: No   Social/Family/Support Systems Patient Roles: Spouse, Parent, Other (Comment) (preacher) Contact Information: wife: [name] [phone] Anticipated Caregiver: [name] + two grown children can assist  Anticipated Caregiver's Contact Information: see above Ability/Limitations of Caregiver: supervision Caregiver Availability: 24/7 Discharge Plan Discussed with Primary Caregiver: Yes (with pt and [name]) Is Caregiver In Agreement with Plan?: Yes Does Caregiver/Family have Issues with Lodging/Transportation while Pt is in Rehab?: No     Goals Patient/Family Goal for Rehab: PT/OT: Mod I/Supervision; SLP: Supervision Expected length of stay: 10-14 days Cultural Considerations: NA Pt/Family Agrees to Admission and willing to participate: Yes Program Orientation Provided & Reviewed with Pt/Caregiver Including Roles  & Responsibilities: Yes (pt and his wife)  Barriers to Discharge: Home environment access/layout, Weight bearing restrictions  Barriers to Discharge Comments: stairs to enter house; stairs to access bedroom/bathroom; tight master bathroom for RW.      Decrease burden of Care through IP rehab admission: NA     Possible need for SNF placement upon discharge:Not anticipated; pt has great social support from his family and has a good prognosis for further gains  through CIR.      Patient Condition: This patient's medical and functional status has changed since the consult dated: 07/17/19 in which the Rehabilitation Physician determined and documented that the patient's condition is appropriate for intensive rehabilitative care in an inpatient rehabilitation facility. See "History of Present Illness" (above) for medical update. Functional changes are: improvement in bed mobility from Max A to Min A, improvement in tranfers from [place] A/Mod A to Min A, improvement in gait from deferred due to pain and fatigue to Min A for 80 feetp; pt continues to require extensive assist for ADLs. Patient's medical and functional status update has been discussed with the Rehabilitation physician and patient remains appropriate for inpatient rehabilitation. Will admit to inpatient rehab today.   Preadmission Screen Completed By:  Raechel Ache, OT, 07/23/2019 11:18 AM ______________________________________________________________________   Discussed status with Dr. Ranell Patrick on 6/27/21at 11:18AM and received approval for admission today.   Admission Coordinator:  Raechel Ache, time 11:18AM/Date 07/23/19.              Cosigned by: Izora Ribas, MD at 07/23/2019 11:30 AM  Revision History                Note Details  Author Raechel Ache, OT File Time 07/23/2019 11:18 AM  Author Type Rehab Admission Coordinator Status Signed  Last Editor Raechel Ache, [place] [facility] [place] # [id number] Admit Date 07/23/2019

## 2019-07-23 NOTE — Progress Notes (Signed)
Inpatient Rehabilitation Medication Review by a Pharmacist  A complete drug regimen review was completed for this patient to identify any potential clinically significant medication issues.  Clinically significant medication issues were identified:  no   Name of provider notified for urgent issues identified:   Provider Method of Notification:    For non-urgent medication issues to be resolved on team rounds tomorrow morning a [facility] [place] was sent to:    Pharmacist comments:   Time spent performing this drug regimen review (minutes):  10 minutes   [place] 07/23/2019 4:30 PM

## 2019-07-23 NOTE — Progress Notes (Signed)
Physical Therapy Treatment Patient Details Name: [name] MRN: [id number] DOB: 05-02-1933 Today's Date: 07/23/2019    History of Present Illness 86 y.o. male with prior MI, HTN presents after unwitnessed fall down stairs. Pt has a history of falls. Pt found to have L distal clavicle fx and L thumb fx, L posterior ribs 1-7 fx, and small SAH.    PT Comments    Pt tolerates treatment well with significant improvement in ambulation and overall activity tolerance. Pt demonstrates improved erect posture during all standing activity and appears to have less LLE discomfort, bearing more weight through the extremity for longer periods of time. Pt does continues to require verbal cues and physical assistance for all aspects of mobility to reduce falls risk and to maintain WB precautions. Pt will continue to benefit from aggressive mobilization with staff and acute PT services to aide in progress toward independent mobility. PT continues to recommend CIR as the pt is progressing well and demonstrates the potential to return to a modI mobility level.  Follow Up Recommendations  CIR     Equipment Recommendations  3in1 (PT);Wheelchair (measurements PT);Wheelchair cushion (measurements PT);Hospital bed    Recommendations for Other Services       Precautions / Restrictions Precautions Precautions: Fall Required Braces or Orthoses: Splint/Cast;Sling Splint/Cast: thumb spica splint, Hard collar changed to 'for comfort.' Pt has been opting for no hard collar. Restrictions Weight Bearing Restrictions: Yes LUE Weight Bearing: Non weight bearing    Mobility  Bed Mobility Overal bed mobility: Needs Assistance Bed Mobility: Supine to Sit     Supine to sit: Min assist;HOB elevated     General bed mobility comments: pt utilizing bed rail and requires PT assist to pivot hips toward edge of bed  Transfers Overall transfer level: Needs assistance Equipment used: 1 person hand held  assist Transfers: Sit to/from [employer] Transfers Sit to Stand: Min assist Stand pivot transfers: Min assist       General transfer comment: pt requires significant cueing for technique including foot placement, hand placement, utilizing only RUE to push, and rocking to gain forward momentum  Ambulation/Gait Ambulation/Gait assistance: Min assist Gait Distance (Feet): 80 Feet (additional trial of 10') Assistive device: IV Pole Gait Pattern/deviations: Step-to pattern;Wide base of support Gait velocity: reduced Gait velocity interpretation: <1.8 ft/sec, indicate of risk for recurrent falls General Gait Details: pt with shortened step to gait pattern, PT providing cues to perform step to gait with L foot leading initially due to discomfort   Stairs             Wheelchair Mobility    Modified Rankin (Stroke Patients Only) Modified Rankin (Stroke Patients Only) Pre-Morbid Rankin Score: No symptoms Modified Rankin: Moderately severe disability     Balance Overall balance assessment: Needs assistance Sitting-balance support: No upper extremity supported;Feet supported Sitting balance-Leahy Scale: Good Sitting balance - Comments: supervision   Standing balance support: Single extremity supported Standing balance-Leahy Scale: Fair Standing balance comment: minG with unilateral UE support of IV pole                            Cognition Arousal/Alertness: Awake/alert Behavior During Therapy: WFL for tasks assessed/performed Overall Cognitive Status: Impaired/Different from baseline Area of Impairment: Memory;Safety/judgement;Problem solving                     Memory: Decreased short-term memory   Safety/Judgement: Decreased awareness of safety;Decreased awareness of deficits  Awareness: Emergent Problem Solving: Slow processing;Requires verbal cues        Exercises      General Comments General comments (skin integrity, edema, etc.):  VSS on RA      Pertinent Vitals/Pain Pain Assessment: Faces Faces Pain Scale: Hurts a little bit Pain Location: L hip Pain Descriptors / Indicators: Grimacing Pain Intervention(s): Monitored during session    Home Living                      Prior Function            PT Goals (current goals can now be found in the care plan section) Acute Rehab PT Goals Patient Stated Goal: To return to independent mobility Progress towards PT goals: Progressing toward goals    Frequency    Min 5X/week      PT Plan Current plan remains appropriate    Co-evaluation              AM-PAC PT "6 Clicks" Mobility   Outcome Measure  Help needed turning from your back to your side while in a flat bed without using bedrails?: A Little Help needed moving from lying on your back to sitting on the side of a flat bed without using bedrails?: A Little Help needed moving to and from a bed to a chair (including a wheelchair)?: A Little Help needed standing up from a chair using your arms (e.g., wheelchair or bedside chair)?: A Little Help needed to walk in hospital room?: A Little Help needed climbing 3-5 steps with a railing? : Total 6 Click Score: 16    End of Session   Activity Tolerance: Patient tolerated treatment well Patient left: in chair;with call bell/phone within reach;with family/visitor present Nurse Communication: Mobility status PT Visit Diagnosis: Other abnormalities of gait and mobility (R26.89);History of falling (Z91.81);Pain Pain - Right/Left: Left Pain - part of body: Hip     Time: 3953-2023 PT Time Calculation (min) (ACUTE ONLY): 30 min  Charges:  $Gait Training: 8-22 mins $Therapeutic Activity: 8-22 mins                     Zenaida Niece, PT, DPT Acute Rehabilitation Pager: [phone]    Zenaida Niece 07/23/2019, 9:36 AM

## 2019-07-24 ENCOUNTER — Inpatient Hospital Stay (HOSPITAL_COMMUNITY): Payer: PPO | Admitting: Speech Pathology

## 2019-07-24 ENCOUNTER — Inpatient Hospital Stay (HOSPITAL_COMMUNITY): Payer: PPO | Admitting: Physical Therapy

## 2019-07-24 ENCOUNTER — Inpatient Hospital Stay (HOSPITAL_COMMUNITY): Payer: PPO

## 2019-07-24 DIAGNOSIS — R339 Retention of urine, unspecified: Secondary | ICD-10-CM

## 2019-07-24 DIAGNOSIS — S069X3S Unspecified intracranial injury with loss of consciousness of 1 hour to 5 hours 59 minutes, sequela: Secondary | ICD-10-CM

## 2019-07-24 DIAGNOSIS — D62 Acute posthemorrhagic anemia: Secondary | ICD-10-CM

## 2019-07-24 DIAGNOSIS — N189 Chronic kidney disease, unspecified: Secondary | ICD-10-CM

## 2019-07-24 DIAGNOSIS — N289 Disorder of kidney and ureter, unspecified: Secondary | ICD-10-CM

## 2019-07-24 LAB — CBC WITH DIFFERENTIAL/PLATELET
Abs Immature Granulocytes: 0.12 10*3/uL — ABNORMAL HIGH (ref 0.00–0.07)
Basophils Absolute: 0 10*3/uL (ref 0.0–0.1)
Basophils Relative: 0 %
Eosinophils Absolute: 0.7 10*3/uL — ABNORMAL HIGH (ref 0.0–0.5)
Eosinophils Relative: 9 %
HCT: 19.4 % — ABNORMAL LOW (ref 39.0–52.0)
Hemoglobin: 6.4 g/dL — CL (ref 13.0–17.0)
Immature Granulocytes: 2 %
Lymphocytes Relative: 13 %
Lymphs Abs: 0.9 10*3/uL (ref 0.7–4.0)
MCH: 32.8 pg (ref 26.0–34.0)
MCHC: 33 g/dL (ref 30.0–36.0)
MCV: 99.5 fL (ref 80.0–100.0)
Monocytes Absolute: 0.7 10*3/uL (ref 0.1–1.0)
Monocytes Relative: 10 %
Neutro Abs: 4.8 10*3/uL (ref 1.7–7.7)
Neutrophils Relative %: 66 %
Platelets: 315 10*3/uL (ref 150–400)
RBC: 1.95 MIL/uL — ABNORMAL LOW (ref 4.22–5.81)
RDW: 15.7 % — ABNORMAL HIGH (ref 11.5–15.5)
WBC: 7.2 10*3/uL (ref 4.0–10.5)
nRBC: 0.4 % — ABNORMAL HIGH (ref 0.0–0.2)

## 2019-07-24 LAB — COMPREHENSIVE METABOLIC PANEL
ALT: 65 U/L — ABNORMAL HIGH (ref 0–44)
AST: 52 U/L — ABNORMAL HIGH (ref 15–41)
Albumin: 2.5 g/dL — ABNORMAL LOW (ref 3.5–5.0)
Alkaline Phosphatase: 110 U/L (ref 38–126)
Anion gap: 7 (ref 5–15)
BUN: 22 mg/dL (ref 8–23)
CO2: 23 mmol/L (ref 22–32)
Calcium: 8.2 mg/dL — ABNORMAL LOW (ref 8.9–10.3)
Chloride: 108 mmol/L (ref 98–111)
Creatinine, Ser: 1.39 mg/dL — ABNORMAL HIGH (ref 0.61–1.24)
GFR calc Af Amer: 54 mL/min — ABNORMAL LOW (ref >60)
GFR calc non Af Amer: 47 mL/min — ABNORMAL LOW (ref >60)
Glucose, Bld: 112 mg/dL — ABNORMAL HIGH (ref 70–99)
Potassium: 3.7 mmol/L (ref 3.5–5.1)
Sodium: 138 mmol/L (ref 135–145)
Total Bilirubin: 1.4 mg/dL — ABNORMAL HIGH (ref 0.3–1.2)
Total Protein: 5.1 g/dL — ABNORMAL LOW (ref 6.5–8.1)

## 2019-07-24 LAB — CBC
HCT: 22.8 % — ABNORMAL LOW (ref 39.0–52.0)
Hemoglobin: 7.3 g/dL — ABNORMAL LOW (ref 13.0–17.0)
MCH: 32.4 pg (ref 26.0–34.0)
MCHC: 32 g/dL (ref 30.0–36.0)
MCV: 101.3 fL — ABNORMAL HIGH (ref 80.0–100.0)
Platelets: 383 10*3/uL (ref 150–400)
RBC: 2.25 MIL/uL — ABNORMAL LOW (ref 4.22–5.81)
RDW: 16.1 % — ABNORMAL HIGH (ref 11.5–15.5)
WBC: 8.3 10*3/uL (ref 4.0–10.5)
nRBC: 0.4 % — ABNORMAL HIGH (ref 0.0–0.2)

## 2019-07-24 LAB — URINALYSIS, ROUTINE W REFLEX MICROSCOPIC
Bilirubin Urine: NEGATIVE
Glucose, UA: NEGATIVE mg/dL
Ketones, ur: NEGATIVE mg/dL
Leukocytes,Ua: NEGATIVE
Nitrite: NEGATIVE
Protein, ur: NEGATIVE mg/dL
RBC / HPF: >50 RBC/hpf — ABNORMAL HIGH (ref 0–5)
Specific Gravity, Urine: 1.02 (ref 1.005–1.030)
pH: 5 (ref 5.0–8.0)

## 2019-07-24 LAB — GLUCOSE, CAPILLARY
Glucose-Capillary: 107 mg/dL — ABNORMAL HIGH (ref 70–99)
Glucose-Capillary: 119 mg/dL — ABNORMAL HIGH (ref 70–99)
Glucose-Capillary: 145 mg/dL — ABNORMAL HIGH (ref 70–99)
Glucose-Capillary: 109 mg/dL — ABNORMAL HIGH (ref 70–99)

## 2019-07-24 LAB — OCCULT BLOOD X 1 CARD TO LAB, STOOL: Fecal Occult Bld: NEGATIVE

## 2019-07-24 MED ORDER — METHOCARBAMOL 500 MG PO TABS
500.0000 mg | ORAL_TABLET | Freq: Four times a day (QID) | ORAL | Status: DC | PRN
  Administered 2019-07-24 – 2019-08-07 (×10): 500 mg via ORAL
  Filled 2019-07-24 (×12): qty 1

## 2019-07-24 MED ORDER — MELATONIN 3 MG PO TABS
3.0000 mg | ORAL_TABLET | Freq: Every day | ORAL | Status: DC
  Administered 2019-07-24 – 2019-08-06 (×14): 3 mg via ORAL
  Filled 2019-07-24 (×14): qty 1

## 2019-07-24 MED ORDER — POLYETHYLENE GLYCOL 3350 17 G PO PACK
17.0000 g | PACK | Freq: Every day | ORAL | Status: DC
  Administered 2019-07-25 – 2019-08-06 (×11): 17 g via ORAL
  Filled 2019-07-24 (×14): qty 1

## 2019-07-24 NOTE — Evaluation (Signed)
Speech Language Pathology Assessment and Plan  Patient Details  Name: [name] MRN: [id number] Date of Birth: [birth year]  SLP Diagnosis: Speech and Language deficits;Cognitive Impairments  Rehab Potential: Excellent ELOS: 2-2.5 weeks    Today's Date: 07/24/2019 SLP Individual Time: 0700-0755 SLP Individual Time Calculation (min): 55 min   Problem List:  Patient Active Problem List   Diagnosis Date Noted  . TBI (traumatic brain injury) ([place]) 07/23/2019  . Traumatic closed fracture of distal clavicle with minimal displacement, left, initial encounter 07/19/2019  . Traumatic brain injury with loss of consciousness ([place])   . Multiple trauma   . Benign prostatic hyperplasia   . Essential hypertension   . AKI (acute kidney injury) ([place])   . Stage 3b chronic kidney disease   . Prediabetes   . Acute blood loss anemia   . Thrombocytopenia ([place])   . Fall 07/14/2019  . Prediabetes   . Eosinophilia   . Need for prophylactic vaccination and inoculation against influenza 11/12/2013  . Nocturnal leg cramps 11/23/2012  . Pain in joint, shoulder region 11/23/2012  . Fall 11/04/2012  . Hematoma 11/04/2012  . Rib pain 11/04/2012  . Acute upper respiratory infections of unspecified site 11/04/2012  . HYPERLIPIDEMIA TYPE IIB / III 03/08/2008  . HYPERTENSION, BENIGN 03/08/2008  . CAD, NATIVE VESSEL 03/08/2008   Past Medical History:  Past Medical History:  Diagnosis Date  . Allergy   . BPH (benign prostatic hyperplasia)   . BPH (benign prostatic hypertrophy)   . CAD (coronary artery disease)    s/p cypher DES to pLAD 6/08; normal LVF;  ETT-Myoview 2009: no ischemia   . Coronary artery disease   . Eosinophilia   . High cholesterol   . HTN (hypertension)   . Hyperlipidemia   . Hypertension   . MI (myocardial infarction) ([place])   . Myocardial infarction ([place])   . Prediabetes   . Trigeminal neuralgia    Past Surgical History:  Past Surgical History:  Procedure Laterality  Date  . APPENDECTOMY    . CARDIAC CATHETERIZATION  07/30/2006   CORONARY ANGIOPLASTY WITH STENT PLACEMENT  . CARDIAC CATHETERIZATION    . EXPLORATORY LAPAROTOMY     age 68  . EXPLORATORY LAPAROTOMY      Assessment / Plan / Recommendation Clinical Impression Patient  is an 86 year old male with history of BPH, CAD, HTN, CKD III, prediabetes, mild cognitive impairment, prior falls who was admitted on 07/14/19 after unwitnessed fall down stairs.  He had amnesia of events and reports of headache, back and chest pain.  CT of the head showed small volume SAH on left scalp contusion, trace left PTX with left 1st-7th rib fractures, mild LUL contusion, 3.1 cm AAA, distal clavicle fracture, dislocation of left interphalangeal joint fourth digit and possible avulsion fracture of MCP joint with advanced degenerative changes.  Dr. Cyndy Freeze consulted for input and recommended conservative management with repeat head CT if neurological decline noted. Cervical spine CT showed mild multilevel DDD.  CT of lumbar spine showed contusion left flank and paraspinal musculature with multilevel DDD and mild to moderate canal stenosis L3/L4. Ortho recommended sling with immobilization LUE and to follow-up with Dr. Doreatha Martin in 2 weeks.  Left thumb was closed reduced and splinted--to be NWB LUE.  Patient has had issues with pain as well as confusion which is resolving. Foley in place due to urinary retention but cardura has been held intermittently. He was started on urecholine on 6/14 . Confusion at nights due  to sundowning improving with wife staying to provide support and reorientation. Pain control has greatly improved and he is showing improvement in activity tolerance with therapy. CIR recommended due to functional decline. Patient admitted 07/23/19.  Patient demonstrates behaviors consistent with a Rancho Level VII and requires overall Min A verbal cues to complete functional and familiar tasks safely in regards problem solving  and short-term recall.  Patient was administered the Cognistat and scored WFL on all subtests with the exception of moderate deficits in visual-construction and STM. Mild high-level word-finding deficits were also noted at the conversation level. Patient would benefit from skilled SLP intervention to maximize his cognitive-linguistic function and overall functional independence prior to discharge.      Skilled Therapeutic Interventions          Administered a cognitive-linguistic evaluation, please see above for details.   SLP Assessment  Patient will need skilled [place] during CIR admission    Recommendations  Oral Care Recommendations: Oral care BID Recommendations for Other Services: Neuropsych consult Patient destination: Home Follow up Recommendations:  (TBD) Equipment Recommended: None recommended by SLP    SLP Frequency 3 to 5 out of 7 days   SLP Duration  SLP Intensity  SLP Treatment/Interventions 2-2.5 weeks  Minumum of 1-2 x/day, 30 to 90 minutes  Cognitive remediation/compensation;Internal/external aids;Speech/Language facilitation;Therapeutic Activities;Environmental controls;Cueing hierarchy;Functional tasks;Patient/family education    Pain Pain Assessment Pain Scale: 0-10 Pain Score: 5  Pain Type: Acute pain Pain Location: Flank Pain Orientation: Left Pain Descriptors / Indicators: Aching Pain Onset: On-going Patients Stated Pain Goal: 3  Prior Functioning Type of Home: House  Lives With: Family Available Help at Discharge: Family;Available 24 hours/day Vocation: Psychologist, occupational work  Programmer, systems Overall Cognitive Status: Impaired/Different from baseline Arousal/Alertness: Awake/alert Orientation Level: Oriented X4 Sustained Attention: Appears intact Memory: Impaired Memory Impairment: Decreased short term memory;Decreased recall of new information Decreased Short Term Memory: Functional complex Immediate Memory  Recall: Sock;Blue;Bed Memory Recall Sock: Not able to recall Memory Recall Blue: With Cue Memory Recall Bed: Not able to recall Awareness: Appears intact Problem Solving: Impaired Problem Solving Impairment: Functional complex Safety/Judgment: Appears intact [place] [employer] Scales of Cognitive Functioning: Automatic/appropriate  Comprehension Auditory Comprehension Overall Auditory Comprehension: Appears within functional limits for tasks assessed Visual Recognition/Discrimination Discrimination: Not tested Reading Comprehension Reading Status: Not tested Expression Expression Primary Mode of Expression: Verbal Verbal Expression Overall Verbal Expression: Impaired Initiation: No impairment Automatic Speech: Name;Social Response Level of Generative/Spontaneous Verbalization: Phrase;Sentence Repetition: No impairment Naming: No impairment Pragmatics: No impairment Other Verbal Expression Comments: Mild high-level word-finding deficits Written Expression Dominant Hand: Right Written Expression: Not tested Oral Motor Oral Motor/Sensory Function Overall Oral Motor/Sensory Function: Within functional limits Motor Speech Overall Motor Speech: Appears within functional limits for tasks assessed   Short Term Goals: Week 1: SLP Short Term Goal 1 (Week 1): Patient will demonstrate functional problem solving for complex tasks with supervision level verbal cues. SLP Short Term Goal 2 (Week 1): Patient will recall new, daily information with supervision level verbal cues for use of compensatory strategies. SLP Short Term Goal 3 (Week 1): Patient will utilize word-finding strategies at the conversation level with supervision level verbal cues.  Refer to Care Plan for Long Term Goals  Recommendations for other services: Neuropsych  Discharge Criteria: Patient will be discharged from SLP if patient refuses treatment 3 consecutive times without medical reason, if treatment goals not  met, if there is a change in medical status, if patient makes no  progress towards goals or if patient is discharged from hospital.  The above assessment, treatment plan, treatment alternatives and goals were discussed and mutually agreed upon: by patient  Janika Jedlicka 07/24/2019, 2:58 PM

## 2019-07-24 NOTE — Progress Notes (Signed)
Inpatient Rehabilitation  Patient information reviewed and entered into eRehab system by Morley Gaumer M. Sabra Sessler, M.A., CCC/SLP, PPS Coordinator.  Information including medical coding, functional ability and quality indicators will be reviewed and updated through discharge.    

## 2019-07-24 NOTE — Progress Notes (Signed)
Occupational Therapy Assessment and Plan  Patient Details  Name: [name] MRN: [id number] Date of Birth: [birth year]  OT Diagnosis: abnormal posture, acute pain, cognitive deficits and muscle weakness (generalized) Rehab Potential:  good ELOS: 2-2 and a half weeks   Today's Date: 07/24/2019 OT Individual Time: 1000-1100 OT Individual Time Calculation (min): 60 min     Problem List:  Patient Active Problem List   Diagnosis Date Noted  . TBI (traumatic brain injury) ([place]) 07/23/2019  . Traumatic closed fracture of distal clavicle with minimal displacement, left, initial encounter 07/19/2019  . Traumatic brain injury with loss of consciousness ([place])   . Multiple trauma   . Benign prostatic hyperplasia   . Essential hypertension   . AKI (acute kidney injury) ([place])   . Stage 3b chronic kidney disease   . Prediabetes   . Acute blood loss anemia   . Thrombocytopenia ([place])   . Fall 07/14/2019  . Prediabetes   . Eosinophilia   . Need for prophylactic vaccination and inoculation against influenza 11/12/2013  . Nocturnal leg cramps 11/23/2012  . Pain in joint, shoulder region 11/23/2012  . Fall 11/04/2012  . Hematoma 11/04/2012  . Rib pain 11/04/2012  . Acute upper respiratory infections of unspecified site 11/04/2012  . HYPERLIPIDEMIA TYPE IIB / III 03/08/2008  . HYPERTENSION, BENIGN 03/08/2008  . CAD, NATIVE VESSEL 03/08/2008    Past Medical History:  Past Medical History:  Diagnosis Date  . Allergy   . BPH (benign prostatic hyperplasia)   . BPH (benign prostatic hypertrophy)   . CAD (coronary artery disease)    s/p cypher DES to pLAD 6/08; normal LVF;  ETT-Myoview 2009: no ischemia   . Coronary artery disease   . Eosinophilia   . High cholesterol   . HTN (hypertension)   . Hyperlipidemia   . Hypertension   . MI (myocardial infarction) ([place])   . Myocardial infarction ([place])   . Prediabetes   . Trigeminal neuralgia    Past Surgical History:  Past Surgical  History:  Procedure Laterality Date  . APPENDECTOMY    . CARDIAC CATHETERIZATION  07/30/2006   CORONARY ANGIOPLASTY WITH STENT PLACEMENT  . CARDIAC CATHETERIZATION    . EXPLORATORY LAPAROTOMY     age 67  . EXPLORATORY LAPAROTOMY      Assessment & Plan Clinical Impression: Patient is a 86 y.o. year old male with recent admission to the hospital on 07/14/19 with history of BPH, CAD, HTN, CKD III, prediabetes, mild cognitive impairment, prior falls who was admitted on 07/14/19 after unwitnessed fall down stairs. Pt sustained distal clavicle fracture, dislocation of left interphalangeal joint fourth digit and possible avulsion fracture of MCP joint with advanced degenerative changes. Pt has contusion of left flank and paraspinal musculature with multilevel DDD and mild to moderate canal stenosis L3/L4. Sling with immobilization LUE- to follow-up in 2 weeks. Left thumb was closed reduced and splinted--to be NWB LUE. Patient transferred to CIR on 07/23/2019 .  Patient currently requires max with basic self-care skills secondary to muscle weakness, decreased coordination, decreased attention, decreased problem solving and decreased memory and decreased sitting balance, decreased standing balance, decreased postural control and difficulty maintaining precautions.  Prior to hospitalization, patient could complete ADLs and IADLs with modified independent .  Patient will benefit from skilled intervention to increase independence with basic self-care skills and increase level of independence with iADL prior to discharge home with care partner.  Anticipate patient will require 24 hour supervision and follow up home  health.  OT - End of Session Activity Tolerance: Tolerates 30+ min activity with multiple rests OT Assessment OT Barriers to Discharge: Inaccessible home environment;Weight bearing restrictions OT Barriers to Discharge Comments: All bathrooms in home on second floor, bathrooms very small OT Patient  demonstrates impairments in the following area(s): Balance;Cognition;Endurance;Motor;Pain;Perception;Safety;Sensory;Skin Integrity;Vision OT Basic ADL's Functional Problem(s): Eating;Grooming;Bathing;Dressing;Toileting OT Advanced ADL's Functional Problem(s): Simple Meal Preparation;Laundry OT Transfers Functional Problem(s): Toilet;Tub/Shower OT Additional Impairment(s): Fuctional Use of Upper Extremity OT Plan OT Intensity: Minimum of 1-2 x/day, 45 to 90 minutes OT Frequency: 5 out of 7 days OT Treatment/Interventions: Balance/vestibular training;Discharge planning;Pain management;Self Care/advanced ADL retraining;Therapeutic Activities;UE/LE Coordination activities;Cognitive remediation/compensation;Disease mangement/prevention;Functional mobility training;Patient/family education;Therapeutic Exercise;Visual/perceptual remediation/compensation;Community reintegration;Neuromuscular re-education;Psychosocial support;DME/adaptive equipment instruction;UE/LE Strength taining/ROM;Wheelchair propulsion/positioning;Splinting/orthotics OT Recommendation Patient destination: Home Follow Up Recommendations: Home health OT Equipment Recommended: To be determined   Skilled Therapeutic Intervention Evaluation with focus on ADL retraining and functional transfers. Education provided on OT role and purpose, CIR, ELOS, POC, and TBI recovery. Pt received semi-reclined in bed with wife present oriented x4. Engaged in discussion about home accessibility, pt and wife reported bathroom was inaccessible. Educated on potential DME and home modifications. Sat from supine to sitting with modA with HOB elevated and use of bed rails due to decreased postural control and L weakness. Completed squat pivot transfer bed < w/c with modA with multimodal cues for motor planning. Pt washed UB with modA due to LUE sling and thumb spica. Pt washed LB with maxA. Educated on figure 4 for LB dressing, pt with insufficient ROM of BLE to  complete. Donned shirt with maxA. Donned socks dependent due to decreased ROM. Donned pants with total assistance due to decreased dynamic standing balance and WB precautions, with pt requiring multimodal cues to maintain WB precuations of LUE and maintain static standing balance. Donned shoes with modA, pt would benefit from education on AE for LB dressing. Ended session with pt seated in w/c with belt alarm on and all needs within reach.  OT Evaluation Precautions/Restrictions  Precautions Precautions: Fall Precaution Booklet Issued: No Precaution Comments: pt requires verbal cues for WB precautions during session Required Braces or Orthoses: Splint/Cast;Sling Cervical Brace: Hard collar (for comfort, pt does not wear often per pt report) Splint/Cast: thumb spica splint, Hard collar changed to 'for comfort.' Pt has been opting for no hard collar. Other Brace: thumb spica splint L hand  Restrictions Weight Bearing Restrictions: Yes LUE Weight Bearing: Non weight bearing Other Position/Activity Restrictions: L rib fxs 1-7 Vital Signs BP 130/62 O2 97  Pain Pain Assessment Pain Scale: 0-10 Pain Score: 5  Pain Type: Acute pain Pain Location: Flank Pain Orientation: Left Pain Descriptors / Indicators: Aching Pain Onset: On-going Patients Stated Pain Goal: 3 Home Living/Prior Functioning Home Living Available Help at Discharge: Family, Available 24 hours/day Type of Home: House Home Access: Stairs to enter Technical brewer of Steps: 5 Entrance Stairs-Rails: Right, Left Home Layout: Two level Alternate Level Stairs-Number of Steps: 13 Alternate Level Stairs-Rails: Right, Left Bathroom Shower/Tub: Government social research officer Accessibility: Yes  Lives With: Family IADL History Homemaking Responsibilities: Yes Current License: Yes Mode of Transportation: Car Prior Function Level of Independence: Independent with basic ADLs, Independent with  homemaking with ambulation  Able to Take Stairs?: Yes Driving: Yes Vocation: Psychologist, 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[employer], teaches biblical classes ADL ADL Grooming: Minimal assistance Where Assessed-Grooming: Sitting at sink, Clinical biochemist Bathing: Moderate assistance Where Assessed-Upper Body Bathing: Sitting at sink, Wheelchair  Lower Body Bathing: Maximal assistance Where Assessed-Lower Body Bathing: Standing at sink, Sitting at sink Upper Body Dressing: Maximal assistance Where Assessed-Upper Body Dressing: Sitting at sink Lower Body Dressing: Maximal assistance Where Assessed-Lower Body Dressing: Sitting at sink, Standing at sink Vision Baseline Vision/History: Wears glasses Wears Glasses: Reading only Patient Visual Report: No change from baseline Vision Assessment?: No apparent visual deficits Additional Comments: no difficulties reading menu Perception  Perception: Within Functional Limits Praxis Praxis: Intact Cognition Overall Cognitive Status: Impaired/Different from baseline Arousal/Alertness: Awake/alert Orientation Level: Person;Place;Situation Person: Oriented Place: Oriented Situation: Oriented Year: 2021 Month: June Day of Week: Correct Memory: Impaired Memory Impairment: Decreased short term memory;Decreased recall of new information Immediate Memory Recall: Sock;Blue;Bed Memory Recall Sock: Not able to recall Memory Recall Blue: With Cue Memory Recall Bed: Not able to recall Safety/Judgment: Appears intact Sensation Sensation Light Touch: Appears Intact Proprioception: Appears Intact Coordination Gross Motor Movements are Fluid and Coordinated: No Fine Motor Movements are Fluid and Coordinated: Yes Coordination and Movement Description: Poor standing balance and difficulty anteriorly shifting weight, weakness of LLE & limited motion of LUE due to sling Heel Shin Test: unable to perform with the LLE due to  pain and weakness a the hip Extremity/Trunk Assessment RUE Assessment RUE Assessment: Within Functional Limits LUE Assessment LUE Assessment: Exceptions to [facility] Active Range of Motion (AROM) Comments: LUE imobilized in sling, to be further assessed General Strength Comments: strength impaired compared to RUE, to be further assessed     Refer to Care Plan for Long Term Goals  Recommendations for other services: None    Discharge Criteria: Patient will be discharged from OT if patient refuses treatment 3 consecutive times without medical reason, if treatment goals not met, if there is a change in medical status, if patient makes no progress towards goals or if patient is discharged from hospital.  The above assessment, treatment plan, treatment alternatives and goals were discussed and mutually agreed upon: by patient  Michelle Nasuti 07/24/2019, 10:47 AM

## 2019-07-24 NOTE — IPOC Note (Addendum)
Overall Plan of Care [facility]) Patient Details Name: [name] MRN: [id number] DOB: Oct 23, 1933  Admitting Diagnosis: TBI (traumatic brain injury) [facility])  Hospital Problems: Principal Problem:   TBI (traumatic brain injury) ([place])     Functional Problem List: Nursing Pain, Bladder, Bowel, Endurance, Skin Integrity  PT Balance, Behavior, Safety, Endurance, Pain, Motor  OT Balance, Cognition, Endurance, Motor, Pain, Perception, Safety, Sensory, Skin Integrity, Vision  SLP Cognition, Linguistic  TR         Basic ADL's: OT Eating, Grooming, Bathing, Dressing, Toileting     Advanced  ADL's: OT Simple Meal Preparation, Laundry     Transfers: PT Bed Mobility, Bed to Chair, Car, [employer], Floor  OT Toilet, Tub/Shower     Locomotion: PT Ambulation, Emergency planning/management officer, Stairs     Additional Impairments: OT Fuctional Use of Upper Extremity  SLP Communication, Social Cognition expression Memory, Problem Solving  TR      Anticipated Outcomes Item Anticipated Outcome  Self Feeding    Swallowing      Basic self-care  supervision-minA  Toileting  supervision-minA   Bathroom Transfers supervision  Bowel/Bladder  pt will be continent x 2, LBM 07/23/19  Transfers  Supervision assist with LRAD  Locomotion  Supervision assist ambulatory with LRAD. CGA stair management.  Communication  Mod I  Cognition  Mod I  Pain  less than 3 out of 10 with prn and other comfort measures in place  Safety/Judgment  to remain fall free while in rehab   Therapy Plan: PT Intensity: Minimum of 1-2 x/day ,45 to 90 minutes PT Frequency: 5 out of 7 days PT Duration Estimated Length of Stay: 14-16 days OT Intensity: Minimum of 1-2 x/day, 45 to 90 minutes OT Frequency: 5 out of 7 days OT Duration/Estimated Length of Stay: 2-2 and a half weeks SLP Intensity: Minumum of 1-2 x/day, 30 to 90 minutes SLP Frequency: 3 to 5 out of 7 days SLP Duration/Estimated Length of Stay: 2-2.5 weeks    Due to the current state of emergency, patients may not be receiving their 3-hours of Medicare-mandated therapy.   Team Interventions: Nursing Interventions Patient/Family Education, Disease Management/Prevention, Discharge Planning, Bladder Management, Pain Management, Cognitive Remediation/Compensation, Bowel Management  PT interventions Ambulation/gait training, Community reintegration, DME/adaptive equipment instruction, Neuromuscular re-education, Psychosocial support, Stair training, UE/LE Strength taining/ROM, Wheelchair propulsion/positioning, Training and development officer, Discharge planning, Functional electrical stimulation, Pain management, Skin care/wound management, Therapeutic Activities, UE/LE Coordination activities, Cognitive remediation/compensation, Disease management/prevention, Functional mobility training, Patient/family education, Splinting/orthotics, Therapeutic Exercise, Visual/perceptual remediation/compensation  OT Interventions Balance/vestibular training, Discharge planning, Pain management, Self Care/advanced ADL retraining, Therapeutic Activities, UE/LE Coordination activities, Cognitive remediation/compensation, Disease mangement/prevention, Functional mobility training, Patient/family education, Therapeutic Exercise, Visual/perceptual remediation/compensation, Community reintegration, Neuromuscular re-education, Psychosocial support, DME/adaptive equipment instruction, UE/LE Strength taining/ROM, Wheelchair propulsion/positioning, Splinting/orthotics  SLP Interventions Cognitive remediation/compensation, Internal/external aids, Speech/Language facilitation, Therapeutic Activities, Environmental controls, Cueing hierarchy, Functional tasks, Patient/family education  TR Interventions    SW/CM Interventions Discharge Planning, Psychosocial Support, Patient/Family Education   Barriers to Discharge MD  Medical stability  Nursing Weight bearing restrictions    PT  Inaccessible home environment, Home environment access/layout, Lack of/limited family support, Weight bearing restrictions Patient has 5 STE his home and 13 steps to access his main bedroom and bathroom, patient's wife availabel for 24/7 supervision for a limited time, one of his daughters may be moving for a job and his other daugher has special needs and cannot assist patient at d/c  OT Inaccessible home environment, Weight bearing restrictions Full bathrooms  on second floor of the home  SLP      SW       Team Discharge Planning: Destination: PT-Home ,OT- Home , SLP-Home Projected Follow-up: PT-Home health PT, OT-  Home health OT, SLP- (TBD) Projected Equipment Needs: PT-Quad cane, Kasandra Knudsen, To be determined, Wheelchair (measurements), Wheelchair cushion (measurements), OT- To be determined, SLP-None recommended by SLP Equipment Details: PT- , OT-  Patient/family involved in discharge planning: PT- Patient,  OT-Patient, SLP-Patient  MD ELOS: 14-17 days Medical Rehab Prognosis:  Excellent Assessment: The patient has been admitted for CIR therapies with the diagnosis of TBI with polytrauma. The team will be addressing functional mobility, strength, stamina, balance, safety, adaptive techniques and equipment, self-care, bowel and bladder mgt, patient and caregiver education, NMR, ortho precautions, orthotics, communication ,cognition, community reentry. Goals have been set at supervision to min assist with mobility and self-care and mod I with cognition.   Due to the current state of emergency, patients may not be receiving their 3 hours per day of Medicare-mandated therapy.    Meredith Staggers, MD, FAAPMR      See Team Conference Notes for weekly updates to the plan of care

## 2019-07-24 NOTE — Progress Notes (Signed)
UA with C&S collected as well as occult stool. Lab sheets missing, re-entered orders so that samples could be sent to lab. Discontinued both orders due to collection.

## 2019-07-24 NOTE — Progress Notes (Signed)
[place] PHYSICAL MEDICINE & REHABILITATION PROGRESS NOTE   Subjective/Complaints: Slept 3-4 hours then awoke disoriented with recall of a vivid dream. Having pain in left arm but it's controlled. Excited to get started with therapy. Appreciative of staff so far  ROS: Patient denies fever, rash, sore throat, blurred vision, nausea, vomiting, diarrhea, cough, shortness of breath or chest pain,  headache, or mood change.    Objective:   No results found. Recent Labs    07/24/19 0545 07/24/19 1042  WBC 7.2 8.3  HGB 6.4* 7.3*  HCT 19.4* 22.8*  PLT 315 383   Recent Labs    07/22/19 1632 07/24/19 0545  NA 136 138  K 5.0 3.7  CL 106 108  CO2 20* 23  GLUCOSE 153* 112*  BUN 29* 22  CREATININE 1.44* 1.39*  CALCIUM 8.2* 8.2*    Intake/Output Summary (Last 24 hours) at 07/24/2019 1130 Last data filed at 07/24/2019 0920 Gross per 24 hour  Intake --  Output 2050 ml  Net -2050 ml     Physical Exam: Vital Signs Blood pressure (!) 143/60, pulse 88, temperature 98.4 F (36.9 C), temperature source Oral, resp. rate 18, height 5\' 8"  (1.727 m), weight 98 kg, SpO2 95 %. Constitutional: No distress . Vital signs reviewed. HEENT: EOMI, oral membranes moist Neck: supple Cardiovascular: RRR without murmur. No JVD    Respiratory/Chest: CTA Bilaterally without wheezes or rales. Normal effort    GI/Abdomen: BS +, non-tender, non-distended Ext: no clubbing, cyanosis, 1+ L>R LE edema Psych: pleasant and cooperative Musculoskeletal:     LUE tender with any movement. Wearing sling, wrist splint in place.   Neurological: alert and oriented to person, place, day of week,mo,year, reason he's here. Normal language. Recalls his street address. Answers biographical questions. Recalled current events in news. RUE 4/5 prox to distal. LUE limited by sling. RLE 3-/5 HF, KE to 4/5 ADF/PF. LLE 2/5 HF, 3/5 KE and 4/5 ADF/PF. Normal sensation    Assessment/Plan: 1. Functional deficits secondary to  TBI with polytrauma which require 3+ hours per day of interdisciplinary therapy in a comprehensive inpatient rehab setting.  Physiatrist is providing close team supervision and 24 hour management of active medical problems listed below.  Physiatrist and rehab team continue to assess barriers to discharge/monitor patient progress toward functional and medical goals  Care Tool:  Bathing              Bathing assist Assist Level: Total Assistance - Patient < 25%     Upper Body Dressing/Undressing Upper body dressing        Upper body assist      Lower Body Dressing/Undressing Lower body dressing            Lower body assist       Toileting Toileting    Toileting assist       Transfers Chair/bed transfer  Transfers assist           Locomotion Ambulation   Ambulation assist              Walk 10 feet activity   Assist           Walk 50 feet activity   Assist           Walk 150 feet activity   Assist           Walk 10 feet on uneven surface  activity   Assist           Wheelchair  Assist               Wheelchair 50 feet with 2 turns activity    Assist            Wheelchair 150 feet activity     Assist          Blood pressure (!) 143/60, pulse 88, temperature 98.4 F (36.9 C), temperature source Oral, resp. rate 18, height 5\' 8"  (1.727 m), weight 98 kg, SpO2 95 %.  Medical Problem List and Plan: 1.  Impaired secondary to TBI             -patient may shower             -ELOS/Goals: S with PT, OT, and SLP  --Patient is beginning CIR therapies today including PT, OT, and SLP  2.  Antithrombotics: -DVT/anticoagulation:  Pharmaceutical: Lovenox             -antiplatelet therapy:  3. Pain Management:   tylenol scheduled at 650 mg qid.    -Ultram prn for moderate to severe pain (was taking at home)    -limit robaxin as it may have been contributing to night time confusion and was using  frequently at night. Change dosing to q6 PRN and reduce to 500mg  4. Mood: LCSW to follow for evaluation and support.              -antipsychotic agents: N/a  -wife appears very supportive 5. Neuropsych: This patient is capable of making decisions on his own behalf.  -cognition demonstrating substantial improvement  6/18-add melatonin at HS to help with sleep   -check sleep chart 6. Skin/Wound Care: Routine pressure relief measures.  7. Fluids/Electrolytes/Nutrition: encourage PO. 8. HTN: Monitor BP tid--continue cardura. Lotrel on hold.  9. Left thumb dislocation: s/p relocation. Continue spica splint 10. Distal clavicle fracture- continue sling 11. New onset diabetes: Hgb A1c-6.5--Carb modified diet. Will monitor BS ac/hs for now.  12. Acute urinary retention: cardura and urecholine  6/18 -pvr 88 today, continent. Continue to monitor 13.  ABLA:H/H on downward trend       -hgb: 9.1>8.4> today: 6.4 initially and 7.3 on repeat  -may be dilutional component  -check stool for OB  -pt denies dizziness, fatigue, sob, HR controlled  -recheck hgb tomorrow. Will need to consider transfusion if it drops any further 14. Acute renal failure on chronic renal failure: SCr 1.97 at admisison:    -BUN/Cr down to 22/1.39 today  -continue to encourage adequate PO  15. Constipation: Patient reporting hard balls yesterday. Stool now loose   -      LOS: 1 days A FACE TO FACE EVALUATION WAS PERFORMED  Meredith Staggers 07/24/2019, 11:30 AM

## 2019-07-24 NOTE — Evaluation (Signed)
Physical Therapy Assessment and Plan  Patient Details  Name: [name] MRN: [id number] Date of Birth: [birth year]  PT Diagnosis: Abnormality of gait, Coordination disorder, Muscle weakness, Pain in joint and Pain in flank Rehab Potential: Good ELOS: 14-16 days   Today's Date: 07/24/2019 PT Individual Time: 1100-1200 PT Individual Time Calculation (min): 60 min    Problem List:  Patient Active Problem List   Diagnosis Date Noted  . TBI (traumatic brain injury) ([place]) 07/23/2019  . Traumatic closed fracture of distal clavicle with minimal displacement, left, initial encounter 07/19/2019  . Traumatic brain injury with loss of consciousness ([place])   . Multiple trauma   . Benign prostatic hyperplasia   . Essential hypertension   . AKI (acute kidney injury) ([place])   . Stage 3b chronic kidney disease   . Prediabetes   . Acute blood loss anemia   . Thrombocytopenia ([place])   . Fall 07/14/2019  . Prediabetes   . Eosinophilia   . Need for prophylactic vaccination and inoculation against influenza 11/12/2013  . Nocturnal leg cramps 11/23/2012  . Pain in joint, shoulder region 11/23/2012  . Fall 11/04/2012  . Hematoma 11/04/2012  . Rib pain 11/04/2012  . Acute upper respiratory infections of unspecified site 11/04/2012  . HYPERLIPIDEMIA TYPE IIB / III 03/08/2008  . HYPERTENSION, BENIGN 03/08/2008  . CAD, NATIVE VESSEL 03/08/2008    Past Medical History:  Past Medical History:  Diagnosis Date  . Allergy   . BPH (benign prostatic hyperplasia)   . BPH (benign prostatic hypertrophy)   . CAD (coronary artery disease)    s/p cypher DES to pLAD 6/08; normal LVF;  ETT-Myoview 2009: no ischemia   . Coronary artery disease   . Eosinophilia   . High cholesterol   . HTN (hypertension)   . Hyperlipidemia   . Hypertension   . MI (myocardial infarction) ([place])   . Myocardial infarction ([place])   . Prediabetes   . Trigeminal neuralgia    Past Surgical History:  Past Surgical History:   Procedure Laterality Date  . APPENDECTOMY    . CARDIAC CATHETERIZATION  07/30/2006   CORONARY ANGIOPLASTY WITH STENT PLACEMENT  . CARDIAC CATHETERIZATION    . EXPLORATORY LAPAROTOMY     age 57  . EXPLORATORY LAPAROTOMY      Assessment & Plan Clinical Impression: Patient is a 86 year old male with history of BPH, CAD, HTN, CKD III, prediabetes, mild cognitive impairment, prior falls who was admitted on 07/14/19 after unwitnessed fall down stairs.  He had amnesia of events and reports of headache, back and chest pain.  CT of the head showed small volume SAH on left scalp contusion, trace left PTX with left 1st-7th rib fractures, mild LUL contusion, 3.1 cm AAA, distal clavicle fracture, dislocation of left interphalangeal joint fourth digit and possible avulsion fracture of MCP joint with advanced degenerative changes.  Dr. Cyndy Freeze consulted for input and recommended conservative management with repeat head CT if neurological decline noted.  Cervical spine CT showed mild multilevel DDD.  CT of lumbar spine showed contusion left flank and paraspinal musculature with multilevel DDD and mild to moderate canal stenosis L3/L4.  Ortho recommended sling with immobilization LUE and to follow-up with Dr. Doreatha Martin in 2 weeks.  Left thumb was closed reduced and splinted--to be NWB LUE.  Patient has had issues with pain as well as confusion with is resolving. Acute on chronic renal failure with rise in SCr to 2.2 treated with IVF and lotrel d/c.  Foley in place due to urinary retention but cardura has been held intermittently. He was started on urecholine on 6/14 .   Confusion at nights due to sundowning improving with wife staying to provide support and reorientation. Pain control has greatly improved and he is showing improvement in activity tolerance with therapy. Patient transferred to CIR on 07/23/2019 .   Patient currently requires max with mobility secondary to muscle weakness and muscle joint tightness,  decreased cardiorespiratoy endurance, decreased coordination, decreased problem solving and decreased safety awareness and decreased standing balance, decreased postural control and decreased balance strategies.  Prior to hospitalization, patient was independent  with mobility and lived with Family in a House home.  Home access is 5Stairs to enter.  Patient will benefit from skilled PT intervention to maximize safe functional mobility, minimize fall risk and decrease caregiver burden for planned discharge home with 24 hour supervision.  Anticipate patient will benefit from follow up [place] at discharge.  PT - End of Session Activity Tolerance: Tolerates < 10 min activity, no significant change in vital signs Endurance Deficit: Yes PT Assessment Rehab Potential (ACUTE/IP ONLY): Good PT Barriers to Discharge: Inaccessible home environment;Home environment access/layout PT Patient demonstrates impairments in the following area(s): Balance;Behavior;Safety;Endurance;Pain;Motor PT Transfers Functional Problem(s): Bed Mobility;Bed to Chair;Car;Furniture;Floor PT Locomotion Functional Problem(s): Ambulation;Wheelchair Mobility;Stairs PT Plan PT Intensity: Minimum of 1-2 x/day ,45 to 90 minutes PT Frequency: 5 out of 7 days PT Duration Estimated Length of Stay: 14-16 days PT Treatment/Interventions: Ambulation/gait training;Community reintegration;DME/adaptive equipment instruction;Neuromuscular re-education;Psychosocial support;Stair training;UE/LE Strength taining/ROM;Wheelchair propulsion/positioning;Balance/vestibular training;Discharge planning;Functional electrical stimulation;Pain management;Skin care/wound management;Therapeutic Activities;UE/LE Coordination activities;Cognitive remediation/compensation;Disease management/prevention;Functional mobility training;Patient/family education;Splinting/orthotics;Therapeutic Exercise;Visual/perceptual remediation/compensation PT Transfers Anticipated  Outcome(s): Supervision assist with LRAD PT Locomotion Anticipated Outcome(s): Supervision assist ambulatory with LRAD. CGA stair management. PT Recommendation Recommendations for Other Services: Therapeutic Recreation consult Therapeutic Recreation Interventions: Stress management;Outing/community reintergration Follow Up Recommendations: Home health PT Patient destination: Home Equipment Recommended: Quad cane;Cane;To be determined;Wheelchair (measurements);Wheelchair cushion (measurements)  Skilled Therapeutic Intervention Pt received sitting in WC and agreeable to PT. PT instructed patient in PT Evaluation and initiated treatment intervention; see below for results. PT educated patient in [place], rehab potential, rehab goals, and discharge recommendations. Sit<>stand as listed below. Attempted gait without AD, but pt unable to perform due to L hip pain. Gait at rial in hall x 67f with mod-max assist. Car transfer training with max assist and UE over therapist shoulder. WC mobility with mod assist using R hemi technique and max cues for technique with poor caryover. [facility] returned to room [facility] and all needs met.       PT Evaluation Precautions/Restrictions Precautions Precautions: Fall Precaution Booklet Issued: No Precaution Comments: pt requires verbal cues for WB precautions during session Required Braces or Orthoses: Splint/Cast;Sling Cervical Brace: Hard collar (for comfort, pt does not wear often per pt report) Splint/Cast: thumb spica splint, Hard collar changed to 'for comfort.' Pt has been opting for no hard collar. Other Brace: thumb spica splint L hand  Restrictions Other Position/Activity Restrictions: L rib fxs 1-7 General   Vital SignsTherapy Vitals Temp: 97.8 F (36.6 C) Pulse Rate: 82 BP: 133/63 Patient Position (if appropriate): Lying Oxygen Therapy SpO2: 98 % O2 Device: Room Air Pain   5/10 L flank, pt repositioned   Home Living/Prior Functioning Home Living Available Help at Discharge: Family;Available 24 hours/day Type of Home: House Home Access: Stairs to enter ECenterPoint Energyof Steps: 5 Entrance Stairs-Rails: Right;Left Home Layout:  Two level Alternate Level Stairs-Number of Steps: 13 Alternate Level Stairs-Rails: Right;Left Bathroom Shower/Tub: Chiropodist: Standard Bathroom Accessibility: Yes Prior Function  Able to Take Stairs?: Yes Driving: Yes 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[employer], teaches biblical classes Vision/Perception  Geologist, engineering: Within Financial controller Praxis: Intact  Cognition Overall Cognitive Status: Impaired/Different from baseline Arousal/Alertness: Awake/alert Sustained Attention: Appears intact Memory: Impaired Memory Impairment: Decreased short term memory;Decreased recall of new information Decreased Short Term Memory: Functional complex Awareness: Appears intact Problem Solving: Impaired Problem Solving Impairment: Functional complex Safety/Judgment: Appears intact [employer] Scales of Cognitive Functioning: Automatic/appropriate Sensation Sensation Light Touch: Appears Intact Proprioception: Appears Intact Coordination Gross Motor Movements are Fluid and Coordinated: No Fine Motor Movements are Fluid and Coordinated: Yes Coordination and Movement Description: Poor standing balance and difficulty anteriorly shifting weight, weakness of LLE & limited motion of LUE due to sling Heel Shin Test: unable to perform with the LLE due to pain and weakness a the hip Motor  Motor Motor: Other (comment) Motor - Skilled Clinical Observations: limited by pain. mild coordination deficits in the LLE  Mobility Bed Mobility Bed Mobility: Supine to Sit;Sit to Supine;Rolling Right;Rolling Left Rolling Right: Minimal Assistance - Patient > 75% Rolling Left: Other  (comment) (unable due to pain) Supine to Sit: Moderate Assistance - Patient 50-74% Sit to Supine: Moderate Assistance - Patient 50-74% Transfers Transfers: Sit to Stand;Stand Pivot Transfers Sit to Stand: Minimal Assistance - Patient > 75% Stand Pivot Transfers: Moderate Assistance - Patient 50 - 74% Stand Pivot Transfer Details: Verbal cues for gait pattern;Verbal cues for technique;Verbal cues for safe use of DME/AE;Manual facilitation for weight shifting Locomotion  Gait Ambulation: Yes Gait Assistance: Maximal Assistance - Patient 25-49% Gait Distance (Feet): 30 Feet Assistive device: Other (Comment) (rail in hall) Gait Assistance Details: unable to perform without rail in hall due to L hip pain and fear of falling Gait Gait: Yes Gait Pattern: Impaired Gait Pattern: Antalgic;Narrow base of support;Step-to pattern Stairs / Additional Locomotion Stairs: No Wheelchair Mobility Wheelchair Mobility: Yes Wheelchair Assistance: Moderate Assistance - Patient 50 - 74% Wheelchair Propulsion: Right upper extremity;Right lower extremity Wheelchair Parts Management: Needs assistance Distance: 50  Trunk/Postural Assessment  Cervical Assessment Cervical Assessment: Exceptions to [facility] Thoracic Assessment Thoracic Assessment: Exceptions to [facility] Lumbar Assessment Lumbar Assessment: Exceptions to [facility] Postural Control Postural Control: Deficits on evaluation (posterior bias)  Balance Balance Balance Assessed: Yes Static Sitting Balance Static Sitting - Balance Support: No upper extremity supported Static Sitting - Level of Assistance: 6: Modified independent (Device/Increase time) Dynamic Sitting Balance Dynamic Sitting - Balance Support: No upper extremity supported Dynamic Sitting - Level of Assistance: 5: Stand by assistance Static Standing Balance Static Standing - Balance Support: Right upper extremity supported Static Standing - Level of Assistance: 4: Min assist Dynamic Standing  Balance Dynamic Standing - Balance Support: During functional activity;Right upper extremity supported Dynamic Standing - Level of Assistance: 2: Max assist;3: Mod assist Extremity Assessment      RLE Assessment RLE Assessment: Within Functional Limits General Strength Comments: grossly 5/5 LLE Assessment LLE Assessment: Exceptions to [facility] Active Range of Motion (AROM) Comments: ankle and knee grossly 4+/5. hip flexion 3+/5 with pain. hip adduciton and abduction 4/5 with mild discormfort    Refer to Care Plan for Long Term Goals  Recommendations for other services: Neuropsych and Therapeutic Recreation  Stress management and Outing/community reintegration  Discharge Criteria: Patient will be discharged from PT if patient refuses  treatment 3 consecutive times without medical reason, if treatment goals not met, if there is a change in medical status, if patient makes no progress towards goals or if patient is discharged from hospital.  The above assessment, treatment plan, treatment alternatives and goals were discussed and mutually agreed upon: by patient  Lorie Phenix 07/24/2019, 8:04 PM

## 2019-07-25 ENCOUNTER — Inpatient Hospital Stay (HOSPITAL_COMMUNITY): Payer: PPO | Admitting: Physical Therapy

## 2019-07-25 ENCOUNTER — Inpatient Hospital Stay (HOSPITAL_COMMUNITY): Payer: PPO

## 2019-07-25 LAB — GLUCOSE, CAPILLARY
Glucose-Capillary: 97 mg/dL (ref 70–99)
Glucose-Capillary: 126 mg/dL — ABNORMAL HIGH (ref 70–99)
Glucose-Capillary: 139 mg/dL — ABNORMAL HIGH (ref 70–99)
Glucose-Capillary: 156 mg/dL — ABNORMAL HIGH (ref 70–99)

## 2019-07-25 LAB — CBC
HCT: 19.5 % — ABNORMAL LOW (ref 39.0–52.0)
HCT: 23 % — ABNORMAL LOW (ref 39.0–52.0)
Hemoglobin: 6.2 g/dL — CL (ref 13.0–17.0)
Hemoglobin: 7.4 g/dL — ABNORMAL LOW (ref 13.0–17.0)
MCH: 32.3 pg (ref 26.0–34.0)
MCH: 32.9 pg (ref 26.0–34.0)
MCHC: 31.8 g/dL (ref 30.0–36.0)
MCHC: 32.2 g/dL (ref 30.0–36.0)
MCV: 101.6 fL — ABNORMAL HIGH (ref 80.0–100.0)
MCV: 102.2 fL — ABNORMAL HIGH (ref 80.0–100.0)
Platelets: 349 10*3/uL (ref 150–400)
Platelets: 408 10*3/uL — ABNORMAL HIGH (ref 150–400)
RBC: 1.92 MIL/uL — ABNORMAL LOW (ref 4.22–5.81)
RBC: 2.25 MIL/uL — ABNORMAL LOW (ref 4.22–5.81)
RDW: 16.5 % — ABNORMAL HIGH (ref 11.5–15.5)
RDW: 17.6 % — ABNORMAL HIGH (ref 11.5–15.5)
WBC: 7.1 10*3/uL (ref 4.0–10.5)
WBC: 8.1 10*3/uL (ref 4.0–10.5)
nRBC: 0.4 % — ABNORMAL HIGH (ref 0.0–0.2)
nRBC: 0.4 % — ABNORMAL HIGH (ref 0.0–0.2)

## 2019-07-25 MED ORDER — TRAMADOL HCL 50 MG PO TABS
50.0000 mg | ORAL_TABLET | Freq: Four times a day (QID) | ORAL | Status: DC | PRN
  Administered 2019-07-25 – 2019-08-07 (×14): 50 mg via ORAL
  Filled 2019-07-25 (×14): qty 1

## 2019-07-25 MED ORDER — TRAMADOL HCL 50 MG PO TABS
50.0000 mg | ORAL_TABLET | Freq: Four times a day (QID) | ORAL | Status: DC
  Filled 2019-07-25: qty 1

## 2019-07-25 NOTE — Progress Notes (Signed)
Physical Therapy Session Note  Patient Details  Name: [name] MRN: [id number] Date of Birth: 1933/08/13  Today's Date: 07/25/2019 PT Individual Time: 1300-1400 PT Individual Time Calculation (min): 60 min   Short Term Goals: Week 1:  PT Short Term Goal 1 (Week 1): Pt will perform bed mobiltiy with min assist PT Short Term Goal 2 (Week 1): Pt will transfer to and from [facility] with min assist PT Short Term Goal 3 (Week 1): Pt will ambulate 55ft with min assist and LRAD PT Short Term Goal 4 (Week 1): Pt will initiate stair training  Skilled Therapeutic Interventions/Progress Updates:    PAIN 5/10 L hip  Stated he was in a lot of pain but was given tramadol which has signficiantly reduced his pain, but that he now felt a bit "light headed".  Rates as 5/10 at this time, treatment to tolerance,rest as needed, care taken not to progress wbing activities too quickly to avoid increased pain  Pt initially oob in wc w/wife at bedside.  Agreeable to treatment.  Wife stating she was leaving to get rest and return later in date.  Encouraged wife to take care of her health and catch up on rest during rehab stay as needed so she will be ready for pt at dc.  Discussed schedule w/therapies.  Transported to gym for session.  SPT wc to mat w/min to mod assist and cues for sequencing.   Repeated STS x 10 reps w/cga for balance, cues to adhere to NWB LUE Sidestepping length of mat x 3 w/increased difficulty w/stepping to L, mn assist for balance. Pt reports no issues w/"light headedness" w/activities, clarified that he just felt a bit "foggy" due to the meds.  SPT mat to wc w/min to mod assist and cues for safety/sequencing. Pt transported to hall for gait trials.  Gait 42ft at hallway rail w/rail on R for assist, decreased step length L, decreased clearance L, able to self correct somewhat w/cues, attributes to hip pain. Pt requires 4-5 min rest between gait efforts due to fatigue.  Pt transported to  day room for cardiovascular activity/decreased wbing to address hip pain Pt set up on [place] from wc and performed at 90*/sec x 20min, 4 minv, repeated x 2 w/1.60min rest between efforts.  States no pain w/this activity but "feels his legs working".  Educated re importance of cardiovascular activities due to restricted mobility since fall/hospitalization.  Pt transported to room at end of session. Pt left oob in wc w/alarm belt set and needs in reach  Therapy Documentation Precautions:  Precautions Precautions: Fall Precaution Booklet Issued: No Precaution Comments: pt requires verbal cues for WB precautions during session Required Braces or Orthoses: Splint/Cast, Sling Cervical Brace: Hard collar (for comfort, pt does not wear often per pt report) Splint/Cast: thumb spica splint, Hard collar changed to 'for comfort.' Pt has been opting for no hard collar. Other Brace: thumb spica splint L hand  Restrictions Weight Bearing Restrictions: Yes LUE Weight Bearing: Non weight bearing Other Position/Activity Restrictions: L rib fxs 1-7    Therapy/Group: Individual Therapy  Callie Fielding, [place] 07/25/2019, 4:45 PM

## 2019-07-25 NOTE — Plan of Care (Signed)
  Problem: RH BLADDER ELIMINATION Goal: RH STG MANAGE BLADDER WITH EQUIPMENT WITH ASSISTANCE Description: STG Manage Bladder With Equipment With mod Assistance Outcome: Not Progressing; pvr q shift

## 2019-07-25 NOTE — Progress Notes (Signed)
Occupational Therapy Session Note  Patient Details  Name: [name] MRN: [id number] Date of Birth: 16-Oct-1933  Today's Date: 07/25/2019 OT Individual Time: 1583-0940 OT Individual Time Calculation (min): 60 min    Short Term Goals: Week 1:  OT Short Term Goal 1 (Week 1): Pt will wash LB with LRAD with minA. OT Short Term Goal 2 (Week 1): Pt will don socks and shoes with LRAD with modA. OT Short Term Goal 3 (Week 1): Pt will complete toilet transfer with modA.  Skilled Therapeutic Interventions/Progress Updates:  Pt received supine in bed with wife present agreeable to OT intervention. Pt required MOD A for pt to completely transition to sidelying ; MAX A to elevate trunk into sitting. Pt complete sit<>stand with SPC with MIN A; MOD A for stand pivot transfer to w/c with pt needing cues to motor plan and sequence pivotal steps to w/c. Pt completed ADLs at sink needing MINA for UB bathing and MIN A to don OH shirt. Provided pt with step by step cues to don RUE into shirt first with good carryover, light MIN A to pull shirt down in the back. Pt completed LB dressing from w/c with MOD A and increased time. Pt sit<>stand with MIN A needing light MIN A to get pants pulled up in the back. Pt transported to therapy gym with total A for time mgmt. Remainder of session to focus functional stepping as precursor to higher level functional mobility. Pt sit<>stand x3 with MIN A needing the same cue each trial to not pull up on SPC. Pt complete functional stepping activity with MIN- MOD A d/t posterior lean where pt instructed to step forward<>backward onto numbers to facilitate functional stepping. Pt reports SOB post activity with O2 97% on RA and HR 108 bpm. Pt required cues throughout session to maintain NWB on LUE. Pt transported back to room in similar fashion as previously indicated. Pt left seated in w/c with all needs within reach and safety belt activated.   Therapy Documentation Precautions:   Precautions Precautions: Fall Precaution Booklet Issued: No Precaution Comments: pt requires verbal cues for WB precautions during session Required Braces or Orthoses: Splint/Cast, Sling Cervical Brace: Hard collar (for comfort, pt does not wear often per pt report) Splint/Cast: thumb spica splint, Hard collar changed to 'for comfort.' Pt has been opting for no hard collar. Other Brace: thumb spica splint L hand  Restrictions Weight Bearing Restrictions: Yes LUE Weight Bearing: Non weight bearing Other Position/Activity Restrictions: L rib fxs 1-7 General:   Vital Signs:  Pain: Pt reports 4/10 pain in low back with pt needing no intervention.    Therapy/Group: Individual Therapy  Ihor Gully 07/25/2019, 12:24 PM

## 2019-07-25 NOTE — Progress Notes (Signed)
[facility] PHYSICAL MEDICINE & REHABILITATION PROGRESS NOTE   Subjective/Complaints: Had pain last night. Started Tramadol which helped. Makes him feel little woozy. Moving bowels regularly.  Hgb increased from 6.2 to 7.4 on repeat labs today.  Creatinine increased 6/18  ROS: Patient denies fever, rash, sore throat, blurred vision, nausea, vomiting, diarrhea, cough, shortness of breath or chest pain,  headache, or mood change.    Objective:   No results found. Recent Labs    07/25/19 0513 07/25/19 1758  WBC 7.1 8.1  HGB 6.2* 7.4*  HCT 19.5* 23.0*  PLT 349 408*   Recent Labs    07/24/19 0545  NA 138  K 3.7  CL 108  CO2 23  GLUCOSE 112*  BUN 22  CREATININE 1.39*  CALCIUM 8.2*    Intake/Output Summary (Last 24 hours) at 07/25/2019 1953 Last data filed at 07/25/2019 1824 Gross per 24 hour  Intake 840 ml  Output 450 ml  Net 390 ml     Physical Exam: Vital Signs Blood pressure (!) 117/56, pulse 78, temperature 97.6 F (36.4 C), resp. rate 18, height 5\' 8"  (1.727 m), weight 98 kg, SpO2 98 %.  General: Alert and oriented x 3, No apparent distress HEENT: Head is normocephalic, atraumatic, PERRLA, EOMI, sclera anicteric, oral mucosa pink and moist, dentition intact, ext ear canals clear,  Neck: Supple without JVD or lymphadenopathy Heart: Reg rate and rhythm. No murmurs rubs or gallops Chest: CTA bilaterally without wheezes, rales, or rhonchi; no distress Abdomen: Soft, non-tender, non-distended, bowel sounds positive. Ext: no clubbing, cyanosis, 1+ L>R LE edema Psych: pleasant and cooperative Musculoskeletal:     LUE tender with any movement. Wearing sling, wrist splint in place.   Neurological: alert and oriented to person, place, day of week,mo,year, reason he's here. Normal language. Recalls his street address. Answers biographical questions. Recalled current events in news. RUE 4/5 prox to distal. LUE limited by sling. RLE 3-/5 HF, KE to 4/5 ADF/PF. LLE 2/5 HF,  3/5 KE and 4/5 ADF/PF. Normal sensation  Assessment/Plan: 1. Functional deficits secondary to TBI with polytrauma which require 3+ hours per day of interdisciplinary therapy in a comprehensive inpatient rehab setting.  Physiatrist is providing close team supervision and 24 hour management of active medical problems listed below.  Physiatrist and rehab team continue to assess barriers to discharge/monitor patient progress toward functional and medical goals  Care Tool:  Bathing    Body parts bathed by patient: Right arm, Left arm, Chest, Abdomen   Body parts bathed by helper: Right lower leg, Left lower leg, Right arm, Buttocks, Front perineal area     Bathing assist Assist Level: Minimal Assistance - Patient > 75%     Upper Body Dressing/Undressing Upper body dressing   What is the patient wearing?: Pull over shirt    Upper body assist Assist Level: Minimal Assistance - Patient > 75%    Lower Body Dressing/Undressing Lower body dressing      What is the patient wearing?: Pants, Underwear/pull up     Lower body assist Assist for lower body dressing: Moderate Assistance - Patient 50 - 74%     Toileting Toileting    Toileting assist Assist for toileting: Moderate Assistance - Patient 50 - 74%     Transfers Chair/bed transfer  Transfers assist     Chair/bed transfer assist level: Minimal Assistance - Patient > 75% Chair/bed transfer assistive device: Cane (quad-cane)   Locomotion Ambulation   Ambulation assist   Ambulation activity did not  occur: Safety/medical concerns  Assist level: Minimal Assistance - Patient > 75% Assistive device: Cane-quad Max distance: 80ft   Walk 10 feet activity   Assist  Walk 10 feet activity did not occur: Safety/medical concerns  Assist level: Minimal Assistance - Patient > 75% Assistive device: Cane-quad   Walk 50 feet activity   Assist Walk 50 feet with 2 turns activity did not occur: Safety/medical concerns   Assist level: Minimal Assistance - Patient > 75% Assistive device: Cane-quad    Walk 150 feet activity   Assist Walk 150 feet activity did not occur: Safety/medical concerns         Walk 10 feet on uneven surface  activity   Assist Walk 10 feet on uneven surfaces activity did not occur: Safety/medical concerns         Wheelchair     Assist   Type of Wheelchair: Manual    Wheelchair assist level: Maximal Assistance - Patient 25 - 49% Max wheelchair distance: 50    Wheelchair 50 feet with 2 turns activity    Assist        Assist Level: Moderate Assistance - Patient 50 - 74%   Wheelchair 150 feet activity     Assist      Assist Level: Maximal Assistance - Patient 25 - 49%   Blood pressure (!) 117/56, pulse 78, temperature 97.6 F (36.4 C), resp. rate 18, height 5\' 8"  (1.727 m), weight 98 kg, SpO2 98 %.  Medical Problem List and Plan: 1.  Impaired secondary to TBI             -patient may shower             -ELOS/Goals: S with PT, OT, and SLP  --Continue CIR therapies today including PT, OT, and SLP  2.  Antithrombotics: -DVT/anticoagulation:  Pharmaceutical: Lovenox             -antiplatelet therapy:  3. Pain Management:   tylenol scheduled at 650 mg qid.    -Ultram prn for moderate to severe pain (was taking at home)- providing better pain relief.    -limit robaxin as it may have been contributing to night time confusion and was using frequently at night. Change dosing to q6 PRN and reduce to 500mg  4. Mood: LCSW to follow for evaluation and support.              -antipsychotic agents: N/a  -wife appears very supportive 5. Neuropsych: This patient is capable of making decisions on his own behalf.  -cognition demonstrating substantial improvement  6/18-add melatonin at HS to help with sleep   -check sleep chart 6. Skin/Wound Care: Routine pressure relief measures.  7. Fluids/Electrolytes/Nutrition: encourage PO. 8. HTN: Monitor BP  tid--continue cardura. Lotrel on hold.   6/19: BP labile- continue to monitor 9. Left thumb dislocation: s/p relocation. Continue spica splint 10. Distal clavicle fracture- continue sling 11. New onset diabetes: Hgb A1c-6.5--Carb modified diet. Will monitor BS ac/hs for now.  12. Acute urinary retention: cardura and urecholine  6/18 -pvr 88 today, continent. Continue to monitor 13.  ABLA:H/H on downward trend       -hgb: 9.1>8.4> today: 6.4 initially and 7.3 on repeat  -may be dilutional component  -check stool for OB  -pt denies dizziness, fatigue, sob, HR controlled  -recheck hgb tomorrow. Will need to consider transfusion if it drops any further  -Hgb 7.4 on repeat CBC today.  14. Acute renal failure on chronic renal failure: SCr 1.97 at  admisison:    -BUN/Cr down to 22/1.39 today  -continue to encourage adequate PO  15. Constipation: Patient reporting hard balls yesterday. Stool now loose    LOS: 2 days A FACE TO FACE EVALUATION WAS PERFORMED  [name] 07/25/2019, 7:53 PM

## 2019-07-25 NOTE — Progress Notes (Signed)
Physical Therapy Session Note  Patient Details  Name: [name] MRN: [id number] Date of Birth: 07-Mar-1933  Today's Date: 07/25/2019 PT Individual Time: 2355-7322 PT Individual Time Calculation (min): 68 min   Short Term Goals: Week 1:  PT Short Term Goal 1 (Week 1): Pt will perform bed mobiltiy with min assist PT Short Term Goal 2 (Week 1): Pt will transfer to and from [facility] with min assist PT Short Term Goal 3 (Week 1): Pt will ambulate 34ft with min assist and LRAD PT Short Term Goal 4 (Week 1): Pt will initiate stair training  Skilled Therapeutic Interventions/Progress Updates:    Pt received sitting in w/c and agreeable to therapy session. Pt reports being tired due to not sleeping well last night. Pt wearing L spica splint throughout session - pt able to verbalize WBing restrictions but requires min cuing during session to apply to mobility tasks. Transported to/from gym in w/c for time management and energy conservation. Pt reports he feels "vague" and states "it doesn't feel like it's quit like me" that started ~87minutes prior to therapist arrival, which pt attributes to the tramadol he took earlier. Stand pivot w/c>EOM using quad cane with min assist - demonstrates very small, shuffled steps with decreased LLE stance time. Repeated sit<>stands to/from elevated EOM, RHHA and L arm across chest to maintain WBing restrictions, 2x10reps with min assist. Gait 71ft x2 using quad cane in R hand near the wall for safety with min assist for balance - demonstrates short step lengths, decreased B LE foot clearance, decreased gait speed, guarded movements - improves during 2nd walk. Reports increased rib discomfort due to breathing deeper during exercise. 4x stand pivots w/c<>EOM using quad-cane in R UE during session with min assist - repeated cuing for pushing up with R hand on arm rest, demonstrates small step lengths. Sitting EOM dual-cognitive task of completing colored thumbtacks circle  picture pattern with rule of placing the pins in order of red, yellow, green, blue with pt requiring max cuing to recall the pattern then had pt remove pins in the same color order starting from the inner most circle working to the outside circle and requires max cuing to complete this task as pt with impaired recall of instructions and impaired sustained attention to task. Standing with R UE support on quad cane performed L lateral steps over hockey stick with min assist for balance, pt with significant difficulty understanding task. Transported back to room in w/c and left seated with needs in reach, seat belt alarm on, and NT present.  Therapy Documentation Precautions:  Precautions Precautions: Fall Precaution Booklet Issued: No Precaution Comments: pt requires verbal cues for WB precautions during session Required Braces or Orthoses: Splint/Cast, Sling Cervical Brace: Hard collar (for comfort, pt does not wear often per pt report) Splint/Cast: thumb spica splint, Hard collar changed to 'for comfort.' Pt has been opting for no hard collar. Other Brace: thumb spica splint L hand  Restrictions Weight Bearing Restrictions: Yes LUE Weight Bearing: Non weight bearing Other Position/Activity Restrictions: L rib fxs 1-7  Pain: Reports some L hip and L rib pain, unrated - provided seated rest breaks and modification of therapeutic interventions for pain management.   Therapy/Group: Individual Therapy  Tawana Scale, PT, DPT 07/25/2019, 1:53 PM

## 2019-07-26 LAB — URINE CULTURE: Culture: NO GROWTH

## 2019-07-26 LAB — GLUCOSE, CAPILLARY
Glucose-Capillary: 105 mg/dL — ABNORMAL HIGH (ref 70–99)
Glucose-Capillary: 127 mg/dL — ABNORMAL HIGH (ref 70–99)
Glucose-Capillary: 160 mg/dL — ABNORMAL HIGH (ref 70–99)
Glucose-Capillary: 92 mg/dL (ref 70–99)

## 2019-07-26 MED ORDER — AMITRIPTYLINE HCL 10 MG PO TABS
10.0000 mg | ORAL_TABLET | Freq: Every day | ORAL | Status: DC
  Administered 2019-07-26 – 2019-07-30 (×5): 10 mg via ORAL
  Filled 2019-07-26 (×5): qty 1

## 2019-07-26 NOTE — Plan of Care (Signed)
Problem: RH BOWEL ELIMINATION Goal: RH STG MANAGE BOWEL WITH ASSISTANCE Description: STG Manage Bowel with min Assistance. Outcome: Progressing Flowsheets (Taken 07/26/2019 1710) STG: Pt will manage bowels with assistance: 6-Modified independent Goal: RH STG MANAGE BOWEL W/MEDICATION W/ASSISTANCE Description: STG Manage Bowel with Medication with mod Assistance. Outcome: Progressing Flowsheets (Taken 07/26/2019 1710) STG: Pt will manage bowels with medication with assistance: 6-Modified independent   Problem: RH BLADDER ELIMINATION Goal: RH STG MANAGE BLADDER WITH EQUIPMENT WITH ASSISTANCE Description: STG Manage Bladder With Equipment With mod Assistance Outcome: Progressing Flowsheets (Taken 07/26/2019 1710) STG: Pt will manage bladder with equipment with assistance: 6-Modified independent   Problem: RH SKIN INTEGRITY Goal: RH STG SKIN FREE OF INFECTION/BREAKDOWN Description: Skin will be free of infection/breakdown with min assist Outcome: Progressing Goal: RH STG MAINTAIN SKIN INTEGRITY WITH ASSISTANCE Description: STG Maintain Skin Integrity With min Assistance. Outcome: Progressing Flowsheets (Taken 07/26/2019 1710) STG: Maintain skin integrity with assistance: 6-Modified independent   Problem: RH SAFETY Goal: RH STG ADHERE TO SAFETY PRECAUTIONS W/ASSISTANCE/DEVICE Description: STG Adhere to Safety Precautions With min Assistance/Device. Outcome: Progressing Flowsheets (Taken 07/26/2019 1710) STG:Pt will adhere to safety precautions with assistance/device: 6-Modified independent   Problem: RH PAIN MANAGEMENT Goal: RH STG PAIN MANAGED AT OR BELOW PT'S PAIN GOAL Description: Pain will be reported less than 4 out of 10 with min assit Outcome: Progressing Note: Pain managed well with Tylenol and Tramadol at this current time.

## 2019-07-26 NOTE — Progress Notes (Signed)
[facility] PHYSICAL MEDICINE & REHABILITATION PROGRESS NOTE   Subjective/Complaints: Slept poorly last night due to pain.  Tramadol has provided pain relief but makes him sleepy during day  ROS: Patient denies fever, rash, sore throat, blurred vision, nausea, vomiting, diarrhea, cough, shortness of breath or chest pain,  headache, or mood change.    Objective:   No results found. Recent Labs    07/25/19 0513 07/25/19 1758  WBC 7.1 8.1  HGB 6.2* 7.4*  HCT 19.5* 23.0*  PLT 349 408*   Recent Labs    07/24/19 0545  NA 138  K 3.7  CL 108  CO2 23  GLUCOSE 112*  BUN 22  CREATININE 1.39*  CALCIUM 8.2*    Intake/Output Summary (Last 24 hours) at 07/26/2019 1625 Last data filed at 07/26/2019 1450 Gross per 24 hour  Intake 480 ml  Output 450 ml  Net 30 ml     Physical Exam: Vital Signs Blood pressure (!) 112/52, pulse 86, temperature 98.2 F (36.8 C), resp. rate 17, height 5\' 8"  (1.727 m), weight 98 kg, SpO2 96 %.   General: Alert and oriented x 3, No apparent distress HEENT: Head is normocephalic, atraumatic, PERRLA, EOMI, sclera anicteric, oral mucosa pink and moist, dentition intact, ext ear canals clear,  Neck: Supple without JVD or lymphadenopathy Heart: Reg rate and rhythm. No murmurs rubs or gallops Chest: CTA bilaterally without wheezes, rales, or rhonchi; no distress Abdomen: Soft, non-tender, non-distended, bowel sounds positive. Ext: no clubbing, cyanosis, 1+ L>R LE edema Psych: pleasant and cooperative Musculoskeletal:     LUE tender with any movement. Wearing sling, wrist splint in place.   Neurological: alert and oriented to person, place, day of week,mo,year, reason he's here. Normal language. Recalls his street address. Answers biographical questions. Recalled current events in news. RUE 4/5 prox to distal. LUE limited by sling. RLE 3-/5 HF, KE to 4/5 ADF/PF. LLE 2/5 HF, 3/5 KE and 4/5 ADF/PF. Normal sensation    Assessment/Plan: 1. Functional  deficits secondary to TBI with polytrauma which require 3+ hours per day of interdisciplinary therapy in a comprehensive inpatient rehab setting.  Physiatrist is providing close team supervision and 24 hour management of active medical problems listed below.  Physiatrist and rehab team continue to assess barriers to discharge/monitor patient progress toward functional and medical goals  Care Tool:  Bathing    Body parts bathed by patient: Right arm, Left arm, Chest, Abdomen   Body parts bathed by helper: Right lower leg, Left lower leg, Right arm, Buttocks, Front perineal area     Bathing assist Assist Level: Minimal Assistance - Patient > 75%     Upper Body Dressing/Undressing Upper body dressing   What is the patient wearing?: Pull over shirt    Upper body assist Assist Level: Minimal Assistance - Patient > 75%    Lower Body Dressing/Undressing Lower body dressing      What is the patient wearing?: Pants, Underwear/pull up     Lower body assist Assist for lower body dressing: Moderate Assistance - Patient 50 - 74%     Toileting Toileting    Toileting assist Assist for toileting: Moderate Assistance - Patient 50 - 74%     Transfers Chair/bed transfer  Transfers assist     Chair/bed transfer assist level: Moderate Assistance - Patient 50 - 74% Chair/bed transfer assistive device: Cane (quad-cane)   Locomotion Ambulation   Ambulation assist   Ambulation activity did not occur: Safety/medical concerns  Assist level: Minimal Assistance -  Patient > 75% Assistive device: Cane-quad Max distance: 48ft   Walk 10 feet activity   Assist  Walk 10 feet activity did not occur: Safety/medical concerns  Assist level: Minimal Assistance - Patient > 75% Assistive device: Cane-quad   Walk 50 feet activity   Assist Walk 50 feet with 2 turns activity did not occur: Safety/medical concerns  Assist level: Minimal Assistance - Patient > 75% Assistive device:  Cane-quad    Walk 150 feet activity   Assist Walk 150 feet activity did not occur: Safety/medical concerns         Walk 10 feet on uneven surface  activity   Assist Walk 10 feet on uneven surfaces activity did not occur: Safety/medical concerns         Wheelchair     Assist   Type of Wheelchair: Manual    Wheelchair assist level: Maximal Assistance - Patient 25 - 49% Max wheelchair distance: 50    Wheelchair 50 feet with 2 turns activity    Assist        Assist Level: Moderate Assistance - Patient 50 - 74%   Wheelchair 150 feet activity     Assist      Assist Level: Maximal Assistance - Patient 25 - 49%   Blood pressure (!) 112/52, pulse 86, temperature 98.2 F (36.8 C), resp. rate 17, height 5\' 8"  (1.727 m), weight 98 kg, SpO2 96 %.  Medical Problem List and Plan: 1.  Impaired secondary to TBI             -patient may shower             -ELOS/Goals: S with PT, OT, and SLP  --Continue CIR therapies today including PT, OT, and SLP  2.  Antithrombotics: -DVT/anticoagulation:  Pharmaceutical: Lovenox             -antiplatelet therapy:  3. Pain Management:   tylenol scheduled at 650 mg qid.    -Ultram prn for moderate to severe pain (was taking at home)- providing better pain relief.    -limit robaxin as it may have been contributing to night time confusion and was using frequently at night. Change dosing to q6 PRN and reduce to 500mg  4. Mood: LCSW to follow for evaluation and support.              -antipsychotic agents: N/a  -wife appears very supportive 5. Neuropsych: This patient is capable of making decisions on his own behalf.  -cognition demonstrating substantial improvement  6/18-add melatonin at HS to help with sleep   -check sleep chart  6/20: Added Amitriptyline 10mg  HS. Advised that he may feel very groggy in the morning. If too much so, will stop medication.  6. Skin/Wound Care: Routine pressure relief measures.  7.  Fluids/Electrolytes/Nutrition: encourage PO. 8. HTN: Monitor BP tid--continue cardura. Lotrel on hold.   6/20: Bp labile. Monitor.  9. Left thumb dislocation: s/p relocation. Continue spica splint 10. Distal clavicle fracture- continue sling 11. New onset diabetes: Hgb A1c-6.5--Carb modified diet. Will monitor BS ac/hs for now.  12. Acute urinary retention: cardura and urecholine  6/18 -pvr 88 today, continent. Continue to monitor 13.  ABLA:H/H on downward trend       -hgb: 9.1>8.4> today: 6.4 initially and 7.3 on repeat  -may be dilutional component  -check stool for OB  -pt denies dizziness, fatigue, sob, HR controlled  -recheck hgb tomorrow. Will need to consider transfusion if it drops any further  -Hgb 7.4 on repeat  CBC today.  14. Acute renal failure on chronic renal failure: SCr 1.97 at admisison:    -BUN/Cr down to 22/1.39 today  -continue to encourage adequate PO  15. Constipation: Patient reporting hard balls yesterday. Stool now loose    LOS: 3 days A FACE TO FACE EVALUATION WAS PERFORMED  Clide Deutscher Osmani Kersten 07/26/2019, 4:25 PM

## 2019-07-27 ENCOUNTER — Inpatient Hospital Stay (HOSPITAL_COMMUNITY): Payer: PPO

## 2019-07-27 DIAGNOSIS — S069X0D Unspecified intracranial injury without loss of consciousness, subsequent encounter: Secondary | ICD-10-CM

## 2019-07-27 LAB — BASIC METABOLIC PANEL
Anion gap: 8 (ref 5–15)
BUN: 24 mg/dL — ABNORMAL HIGH (ref 8–23)
CO2: 24 mmol/L (ref 22–32)
Calcium: 8.8 mg/dL — ABNORMAL LOW (ref 8.9–10.3)
Chloride: 107 mmol/L (ref 98–111)
Creatinine, Ser: 1.47 mg/dL — ABNORMAL HIGH (ref 0.61–1.24)
GFR calc Af Amer: 50 mL/min — ABNORMAL LOW (ref >60)
GFR calc non Af Amer: 43 mL/min — ABNORMAL LOW (ref >60)
Glucose, Bld: 93 mg/dL (ref 70–99)
Potassium: 4.4 mmol/L (ref 3.5–5.1)
Sodium: 139 mmol/L (ref 135–145)

## 2019-07-27 LAB — GLUCOSE, CAPILLARY
Glucose-Capillary: 83 mg/dL (ref 70–99)
Glucose-Capillary: 121 mg/dL — ABNORMAL HIGH (ref 70–99)
Glucose-Capillary: 103 mg/dL — ABNORMAL HIGH (ref 70–99)
Glucose-Capillary: 133 mg/dL — ABNORMAL HIGH (ref 70–99)

## 2019-07-27 LAB — CBC
HCT: 23.1 % — ABNORMAL LOW (ref 39.0–52.0)
Hemoglobin: 7.5 g/dL — ABNORMAL LOW (ref 13.0–17.0)
MCH: 34.1 pg — ABNORMAL HIGH (ref 26.0–34.0)
MCHC: 32.5 g/dL (ref 30.0–36.0)
MCV: 105 fL — ABNORMAL HIGH (ref 80.0–100.0)
Platelets: 437 10*3/uL — ABNORMAL HIGH (ref 150–400)
RBC: 2.2 MIL/uL — ABNORMAL LOW (ref 4.22–5.81)
RDW: 18.6 % — ABNORMAL HIGH (ref 11.5–15.5)
WBC: 9 10*3/uL (ref 4.0–10.5)
nRBC: 0.2 % (ref 0.0–0.2)

## 2019-07-27 NOTE — Progress Notes (Signed)
Physical Therapy Session Note  Patient Details  Name: [name] MRN: [id number] Date of Birth: [birth year]  Today's Date: 07/27/2019 PT Individual Time: 0900-1000 PT Individual Time Calculation (min): 60 min   Short Term Goals: Week 1:  PT Short Term Goal 1 (Week 1): Pt will perform bed mobiltiy with min assist PT Short Term Goal 2 (Week 1): Pt will transfer to and from [facility] with min assist PT Short Term Goal 3 (Week 1): Pt will ambulate 54ft with min assist and LRAD PT Short Term Goal 4 (Week 1): Pt will initiate stair training  Skilled Therapeutic Interventions/Progress Updates:     Patient in seated in w/c in the room upon PT arrival. Patient alert and agreeable to PT session. Patient reported 2-3/10 L rib, shoulder, and thumb pain at rest, 5-6/10 with mobility during session, RN made aware. PT provided repositioning, rest breaks, and distraction as pain interventions throughout session. L spica splint donned prior to session and remained in place throughout session. L arm sling in place and readjusted to comfort during session.   Therapeutic Activity: Transfers: Patient performed sit to/from stand x8 with CGA with use of quad cane or railing and min A x2 with HHA. Provided verbal cues for forward weight shift, using a pillow to splint his ribs during transfers for pain management, and scooting forward before standing.  Gait Training:  Patient ambulated 60 feet using a R rail x1 and quad cane x1 with CGA. Noted mild instability of quad cane during ambulation, performed a trial ambulating 60 feet without an AD with min A using HHA then 30 feet using a fore-arm crutch without improved stability and decreased gait speed due to patient feeling less supported with this device. Will continue with the quad cane at this time. Ambulated with intermittent varible foot placement, decreased gait speed, decreased step length and height, mild forward trunk lean, and downward head gaze. Provided  verbal cues for erect posture, looking ahead, and consistent step placement. Patient ascended/descended 8-3" steps x2 using R rail with CGA-min A for steadying support. Performed step-to gait pattern leading with R while ascending and L while descending due to increased L hip pain in standing. Provided cues for technique and sequencing. Patient with increased speed ascending on first trial, reported mild dizziness at top of steps. Symptoms improved with pursed lip breathing and standing rest break and resolved once descending steps and returned to sitting. Patient denied symptoms throughout second trial, cued decreased speed and pursed lip breathing throughout.  Wheelchair Mobility:  Patient was transported in the w/c with total A throughout session for energy conservation and time management.  Patient required increased time for all mobility and rest breaks throughout session due to pain and decreased activity tolerance. Provided education on splinting with a pillow for pain management with rib fractures, use of IS in room for improved breath support, and discussed home set up and POC during rest breaks.  Patient in w/c in the room at end of session with breaks locked, seat belt alarm set, and all needs within reach.    Therapy Documentation Precautions:  Precautions Precautions: Fall Precaution Booklet Issued: No Precaution Comments: pt requires verbal cues for WB precautions during session Required Braces or Orthoses: Splint/Cast, Sling Cervical Brace: Hard collar (for comfort, pt does not wear often per pt report) Splint/Cast: thumb spica splint, Hard collar changed to 'for comfort.' Pt has been opting for no hard collar. Other Brace: thumb spica splint L hand  Restrictions Weight Bearing  Restrictions: Yes LUE Weight Bearing: Non weight bearing Other Position/Activity Restrictions: L rib fxs 1-7    Therapy/Group: Individual Therapy  Shaunice Levitan L Brayah Urquilla PT, DPT  07/27/2019, 12:36 PM

## 2019-07-27 NOTE — Progress Notes (Signed)
Speech Language Pathology Daily Session Note  Patient Details  Name: [name] MRN: [id number] Date of Birth: 1933-12-22  Today's Date: 07/27/2019 SLP Individual Time: 1102-1200 SLP Individual Time Calculation (min): 58 min  Short Term Goals: Week 1: SLP Short Term Goal 1 (Week 1): Patient will demonstrate functional problem solving for complex tasks with supervision level verbal cues. SLP Short Term Goal 2 (Week 1): Patient will recall new, daily information with supervision level verbal cues for use of compensatory strategies. SLP Short Term Goal 3 (Week 1): Patient will utilize word-finding strategies at the conversation level with supervision level verbal cues.  Skilled Therapeutic Interventions: Skilled ST services focused on cognitive skills. Pt requested to use the bathroom upon entering room, SLP assisted with supervision. Pt voided BM and while washing hands MD entered. Pt demonstrated recall of medication piror to hospitalize with min A verbal cues due to word finding and recall. SLP facilitated complex problem solving skills in TID (x3 times a day) pill organizer,  pt required min A verbal fading to supervision A verbal cues. Pt was unable to complete task due to time restraints and will complete in upcoming session. Pt demonstrated recall of medication with list Mod I. Pt was left in room with call bell within reach and chair alarm set. ST recommends to continue skilled ST services.      Pain Pain Assessment Pain Score: 0-No pain  Therapy/Group: Individual Therapy  Fran Neiswonger  [facility] 07/27/2019, 12:39 PM

## 2019-07-27 NOTE — Plan of Care (Signed)
  Problem: Consults Goal: RH BRAIN INJURY PATIENT EDUCATION Description: Description: See Patient Education module for eduction specifics Outcome: Progressing Goal: Skin Care Protocol Initiated - if Braden Score 18 or less Description: If consults are not indicated, leave blank or document N/A Outcome: Progressing   Problem: RH BOWEL ELIMINATION Goal: RH STG MANAGE BOWEL WITH ASSISTANCE Description: STG Manage Bowel with min Assistance. Outcome: Progressing Goal: RH STG MANAGE BOWEL W/MEDICATION W/ASSISTANCE Description: STG Manage Bowel with Medication with mod Assistance. Outcome: Progressing   Problem: RH BLADDER ELIMINATION Goal: RH STG MANAGE BLADDER WITH EQUIPMENT WITH ASSISTANCE Description: STG Manage Bladder With Equipment With mod Assistance Outcome: Progressing   Problem: RH SKIN INTEGRITY Goal: RH STG SKIN FREE OF INFECTION/BREAKDOWN Description: Skin will be free of infection/breakdown with min assist Outcome: Progressing Goal: RH STG MAINTAIN SKIN INTEGRITY WITH ASSISTANCE Description: STG Maintain Skin Integrity With min Assistance. Outcome: Progressing   Problem: RH SAFETY Goal: RH STG ADHERE TO SAFETY PRECAUTIONS W/ASSISTANCE/DEVICE Description: STG Adhere to Safety Precautions With min Assistance/Device. Outcome: Progressing   Problem: RH COGNITION-NURSING Goal: RH STG USES MEMORY AIDS/STRATEGIES W/ASSIST TO PROBLEM SOLVE Description: STG Uses Memory Aids/Strategies With min Assistance to Problem Solve. Outcome: Progressing   Problem: RH PAIN MANAGEMENT Goal: RH STG PAIN MANAGED AT OR BELOW PT'S PAIN GOAL Description: Pain will be reported less than 4 out of 10 with min assit Outcome: Progressing

## 2019-07-27 NOTE — Care Management (Signed)
Inpatient [facility] Statement of Services  Patient Name:  [name]  Date:  07/27/2019  Welcome to the [facility].  Our goal is to provide you with an individualized program based on your diagnosis and situation, designed to meet your specific needs.  With this comprehensive rehabilitation program, you will be expected to participate in at least 3 hours of rehabilitation therapies Monday-Friday, with modified therapy programming on the weekends.  Your rehabilitation program will include the following services:  Physical Therapy (PT), Occupational Therapy (OT), Speech Therapy (ST), 24 hour per day rehabilitation nursing, Therapeutic Recreaction (TR), Psychology, Neuropsychology, Care Coordinator, Rehabilitation Medicine, Nutrition Services, Pharmacy Services and Other  Weekly team conferences will be held on Tuesdays to discuss your progress.  Your Inpatient Rehabilitation Care Coordinator will talk with you frequently to get your input and to update you on team discussions.  Team conferences with you and your family in attendance may also be held.  Expected length of stay: 14-16 days    Overall anticipated outcome: Supervision  Depending on your progress and recovery, your program may change. Your Inpatient Rehabilitation Care Coordinator will coordinate services and will keep you informed of any changes. Your Inpatient Rehabilitation Care Coordinator's name and contact numbers are listed  below.  The following services may also be recommended but are not provided by the [facility] will be made to provide these services after discharge if needed.  Arrangements include referral to agencies that provide these services.  Your insurance has been verified to be:  [employer]   Your primary doctor is:  No PCP  Pertinent information will be shared with your doctor and your insurance company.  Inpatient Rehabilitation Care Coordinator:  Cathleen Corti [phone] or ([phone]  Information discussed with and copy given to patient by: Rana Snare, 07/27/2019, 2:03 PM

## 2019-07-27 NOTE — Progress Notes (Signed)
Occupational Therapy Session Note  Patient Details  Name: [name] MRN: [id number] Date of Birth: 01/28/1934  Today's Date: 07/27/2019 OT Individual Time: 0502-5615 OT Individual Time Calculation (min): 60 min    Short Term Goals: Week 1:  OT Short Term Goal 1 (Week 1): Pt will wash LB with LRAD with minA. OT Short Term Goal 2 (Week 1): Pt will don socks and shoes with LRAD with modA. OT Short Term Goal 3 (Week 1): Pt will complete toilet transfer with modA.  Skilled Therapeutic Interventions/Progress Updates:    Pt received on toilet with wife present. Pt alert, oriented to place, situation, month, but not year- stating it was 2023. Pt voided small BM. Pt required min cueing throughout session for adherence to NWB LUE precautions. Pt completed transfer into shower onto TTB with min cueing for technique and min HHA. Pt washed UB with min cueing/assist for reaching under RUE. Pt required min A for washing buttocks in standing, with fair balance strategies without UE support. Pt transferred to w/c with CGA using grab bar for support. Pt required cueing for hemi technique and mod A to don shirt. Max A to don pants. Sling donned max A. Discussed potential AE to use. Pt was left sitting up in the w/c with chair alarm belt fastened and all needs met.   Therapy Documentation Precautions:  Precautions Precautions: Fall Precaution Booklet Issued: No Precaution Comments: pt requires verbal cues for WB precautions during session Required Braces or Orthoses: Splint/Cast, Sling Cervical Brace: Hard collar (for comfort, pt does not wear often per pt report) Splint/Cast: thumb spica splint, Hard collar changed to 'for comfort.' Pt has been opting for no hard collar. Other Brace: thumb spica splint L hand  Restrictions Weight Bearing Restrictions: Yes LUE Weight Bearing: Non weight bearing Other Position/Activity Restrictions: L rib fxs 1-7  Therapy/Group: Individual Therapy  [name] 07/27/2019, 6:46 AM

## 2019-07-27 NOTE — Plan of Care (Signed)
Behavioral Plan: [name]  [place] Level: VII-VIII  Behavior to decrease/ eliminate: Promote adherence to weightbearing precautions of LUE (NWB) and proper sling placement/support, confusion, pain management   Changes to environment: proper sleep/wake cycles- blinds open/lights on during the day, decreased interruptions at night, family support  Interventions: Safety belt when up in chair, urinal within reach, pillow from home nearby to splint ribs, premedicate therapy sessions, calendar for external cueing   Recommendations for interactions with patient: clear directions/commands, reorient as appropriate secondary to short term memory deficits, ensure LUE is NWB- NO RW- cane only    Attendees:  Laverle Hobby, OT Apolinar Junes, PT

## 2019-07-27 NOTE — Progress Notes (Signed)
[place] PHYSICAL MEDICINE & REHABILITATION PROGRESS NOTE   Subjective/Complaints:   No issues overnite, had BM this am   ROS: Patient denies CP, SOB, N/V/D  Objective:   No results found. Recent Labs    07/25/19 1758 07/27/19 0617  WBC 8.1 9.0  HGB 7.4* 7.5*  HCT 23.0* 23.1*  PLT 408* 437*   Recent Labs    07/27/19 0617  NA 139  K 4.4  CL 107  CO2 24  GLUCOSE 93  BUN 24*  CREATININE 1.47*  CALCIUM 8.8*    Intake/Output Summary (Last 24 hours) at 07/27/2019 1056 Last data filed at 07/27/2019 0758 Gross per 24 hour  Intake 800 ml  Output 1300 ml  Net -500 ml     Physical Exam: Vital Signs Blood pressure (!) 150/63, pulse 81, temperature 98.2 F (36.8 C), temperature source Oral, resp. rate 18, height 5\' 8"  (1.727 m), weight 98 kg, SpO2 95 %.    Ext: no clubbing, cyanosis, 1+ L>R LE edema Psych: pleasant and cooperative Musculoskeletal:     LUE tender with any movement. Wearing sling, wrist splint in place.   Neurological: alert and oriented to person, place, day of week,mo,year, reason he's here. Normal language. Recalls his street address. Answers biographical questions. Recalled current events in news. RUE 4/5 prox to distal. LUE limited by sling. RLE 3-/5 HF, KE to 4/5 ADF/PF. LLE 2/5 HF, 3/5 KE and 4/5 ADF/PF. Normal sensation    Assessment/Plan: 1. Functional deficits secondary to TBI with polytrauma which require 3+ hours per day of interdisciplinary therapy in a comprehensive inpatient rehab setting.  Physiatrist is providing close team supervision and 24 hour management of active medical problems listed below.  Physiatrist and rehab team continue to assess barriers to discharge/monitor patient progress toward functional and medical goals  Care Tool:  Bathing    Body parts bathed by patient: Right arm, Left arm, Chest, Abdomen   Body parts bathed by helper: Right lower leg, Left lower leg, Right arm, Buttocks, Front perineal area      Bathing assist Assist Level: Minimal Assistance - Patient > 75%     Upper Body Dressing/Undressing Upper body dressing   What is the patient wearing?: Pull over shirt    Upper body assist Assist Level: Minimal Assistance - Patient > 75%    Lower Body Dressing/Undressing Lower body dressing      What is the patient wearing?: Pants, Underwear/pull up     Lower body assist Assist for lower body dressing: Moderate Assistance - Patient 50 - 74%     Toileting Toileting    Toileting assist Assist for toileting: Moderate Assistance - Patient 50 - 74%     Transfers Chair/bed transfer  Transfers assist     Chair/bed transfer assist level: Moderate Assistance - Patient 50 - 74% Chair/bed transfer assistive device: Cane (quad-cane)   Locomotion Ambulation   Ambulation assist   Ambulation activity did not occur: Safety/medical concerns  Assist level: Minimal Assistance - Patient > 75% Assistive device: Cane-quad Max distance: 27ft   Walk 10 feet activity   Assist  Walk 10 feet activity did not occur: Safety/medical concerns  Assist level: Minimal Assistance - Patient > 75% Assistive device: Cane-quad   Walk 50 feet activity   Assist Walk 50 feet with 2 turns activity did not occur: Safety/medical concerns  Assist level: Minimal Assistance - Patient > 75% Assistive device: Cane-quad    Walk 150 feet activity   Assist Walk 150 feet activity  did not occur: Safety/medical concerns         Walk 10 feet on uneven surface  activity   Assist Walk 10 feet on uneven surfaces activity did not occur: Safety/medical concerns         Wheelchair     Assist Will patient use wheelchair at discharge?: Yes (Per PT long term goals) Type of Wheelchair: Manual    Wheelchair assist level: Maximal Assistance - Patient 25 - 49% Max wheelchair distance: 50    Wheelchair 50 feet with 2 turns activity    Assist        Assist Level: Moderate  Assistance - Patient 50 - 74%   Wheelchair 150 feet activity     Assist      Assist Level: Maximal Assistance - Patient 25 - 49%   Blood pressure (!) 150/63, pulse 81, temperature 98.2 F (36.8 C), temperature source Oral, resp. rate 18, height 5\' 8"  (1.727 m), weight 98 kg, SpO2 95 %.  Medical Problem List and Plan: 1.  Impaired secondary to TBI             -patient may shower             -ELOS/Goals: S with PT, OT, and SLP  --Continue CIR therapies today including PT, OT, and SLP  2.  Antithrombotics: -DVT/anticoagulation:  Pharmaceutical: Lovenox             -antiplatelet therapy:  3. Pain Management:   tylenol scheduled at 650 mg qid.    -Ultram prn for moderate to severe pain (was taking at home)- providing better pain relief.    -limit robaxin as it may have been contributing to night time confusion and was using frequently at night. Change dosing to q6 PRN and reduce to 500mg  4. Mood: LCSW to follow for evaluation and support.              -antipsychotic agents: N/a  -wife appears very supportive 5. Neuropsych: This patient is capable of making decisions on his own behalf.  -cognition demonstrating substantial improvement  6/18-add melatonin at HS to help with sleep   -check sleep chart  6/20: Added Amitriptyline 10mg  HS. Advised that he may feel very groggy in the morning. If too much so, will stop medication.  6. Skin/Wound Care: Routine pressure relief measures.  7. Fluids/Electrolytes/Nutrition: encourage PO. 8. HTN: Monitor BP tid--continue cardura. Lotrel on hold.   6/20: Bp labile. Monitor.  9. Left thumb dislocation: s/p relocation. Continue spica splint 10. Distal clavicle fracture- continue sling 11. New onset diabetes: Hgb A1c-6.5--Carb modified diet. Will monitor BS ac/hs for now.  12. Acute urinary retention: cardura and urecholine  6/18 -pvr 88 today, continent. Continue to monitor 13.  ABLA:H/H on downward trend       -hgb: 9.1>8.4> today: 6.4  initially and 7.3 on repeat  -may be dilutional component  -check stool for OB  -pt denies dizziness, fatigue, sob, HR controlled    -Hgb 7.4 on repeat CBC hgb 7.5- stable await stool OB, had first BM today , flushed  14. Acute renal failure on chronic renal failure: SCr 1.97 at admisison:    -BUN/Cr down to 22/1.39 today  -continue to encourage adequate PO  15. Constipation: Patient reporting hard balls yesterday. Stool now loose    LOS: 4 days A FACE TO FACE EVALUATION WAS PERFORMED  Charlett Blake 07/27/2019, 10:56 AM

## 2019-07-28 ENCOUNTER — Inpatient Hospital Stay (HOSPITAL_COMMUNITY): Payer: PPO

## 2019-07-28 ENCOUNTER — Inpatient Hospital Stay (HOSPITAL_COMMUNITY): Payer: PPO | Admitting: Speech Pathology

## 2019-07-28 LAB — GLUCOSE, CAPILLARY
Glucose-Capillary: 91 mg/dL (ref 70–99)
Glucose-Capillary: 112 mg/dL — ABNORMAL HIGH (ref 70–99)
Glucose-Capillary: 96 mg/dL (ref 70–99)
Glucose-Capillary: 104 mg/dL — ABNORMAL HIGH (ref 70–99)

## 2019-07-28 IMAGING — CR DG LUMBAR SPINE COMPLETE 4+V
5 series · 5 of 5 positions shown · non-contrast
Comparison: CT [DATE]

CLINICAL DATA: Fell 8 days ago multiple broken ribs, broken
collarbone continued back pain

EXAM:
LUMBAR SPINE - COMPLETE 4+ VIEW

[l-spine ap]
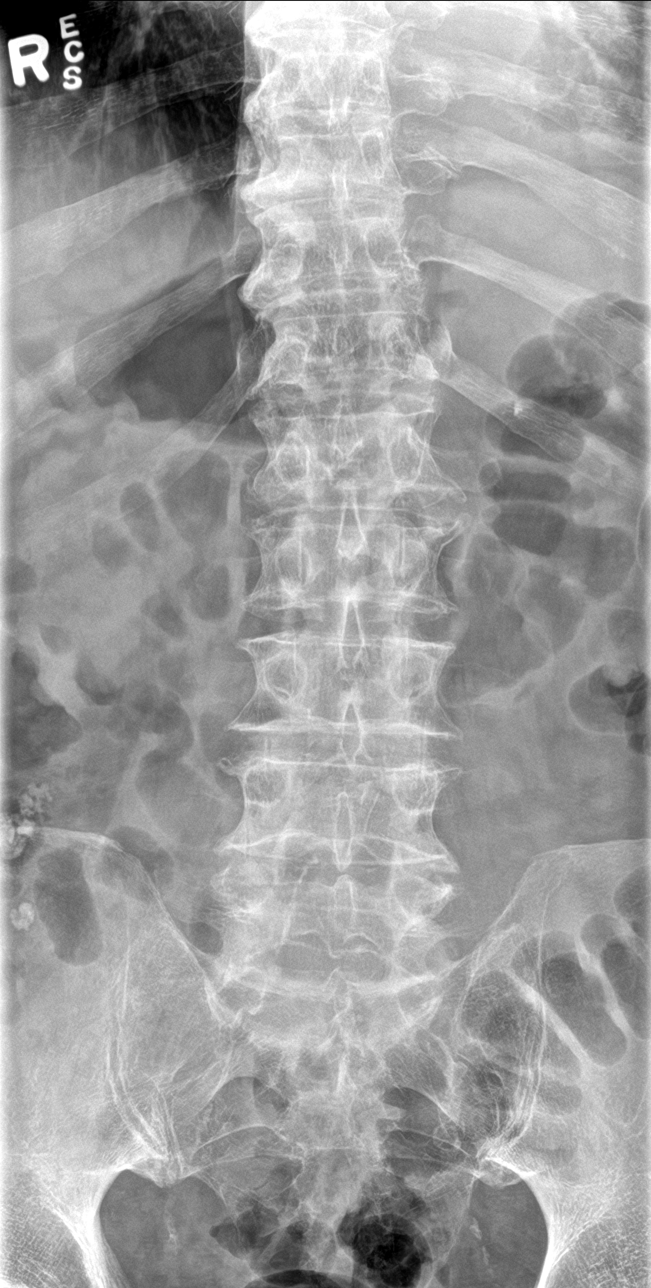

[l-spine obl (1 of 2)]
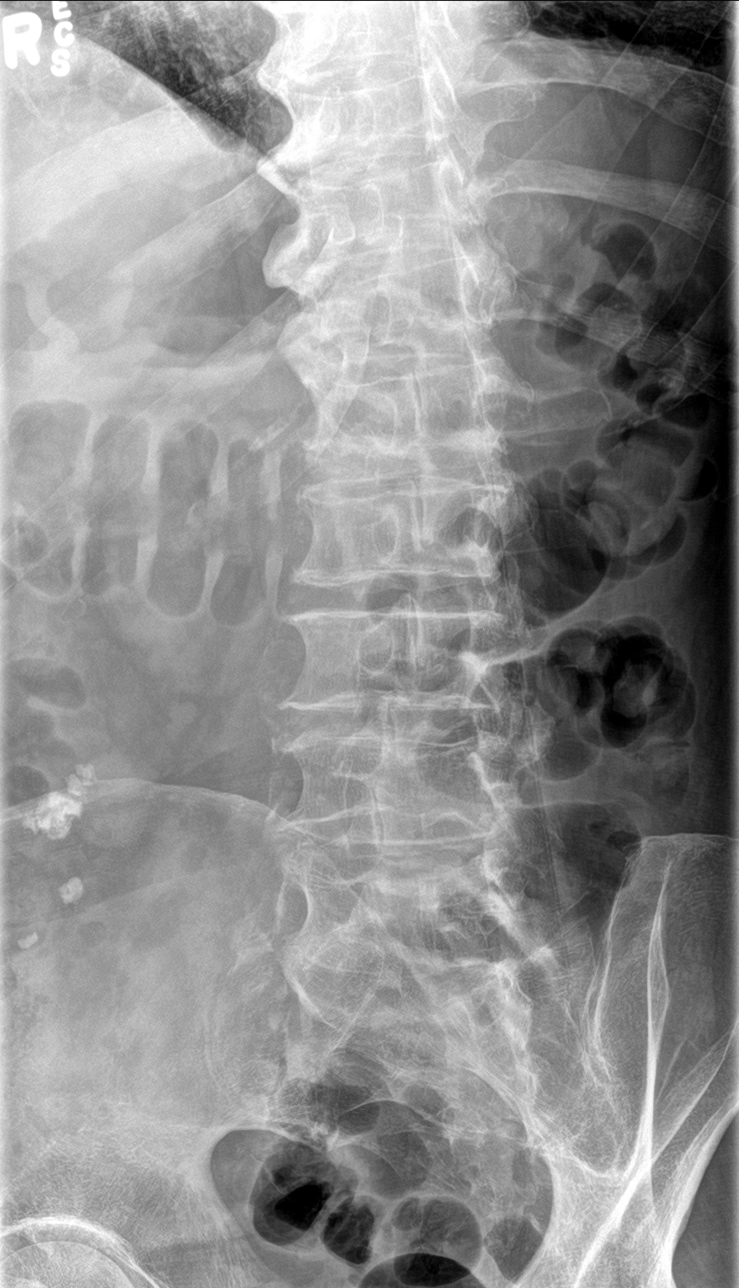

[l-spine obl (2 of 2)]
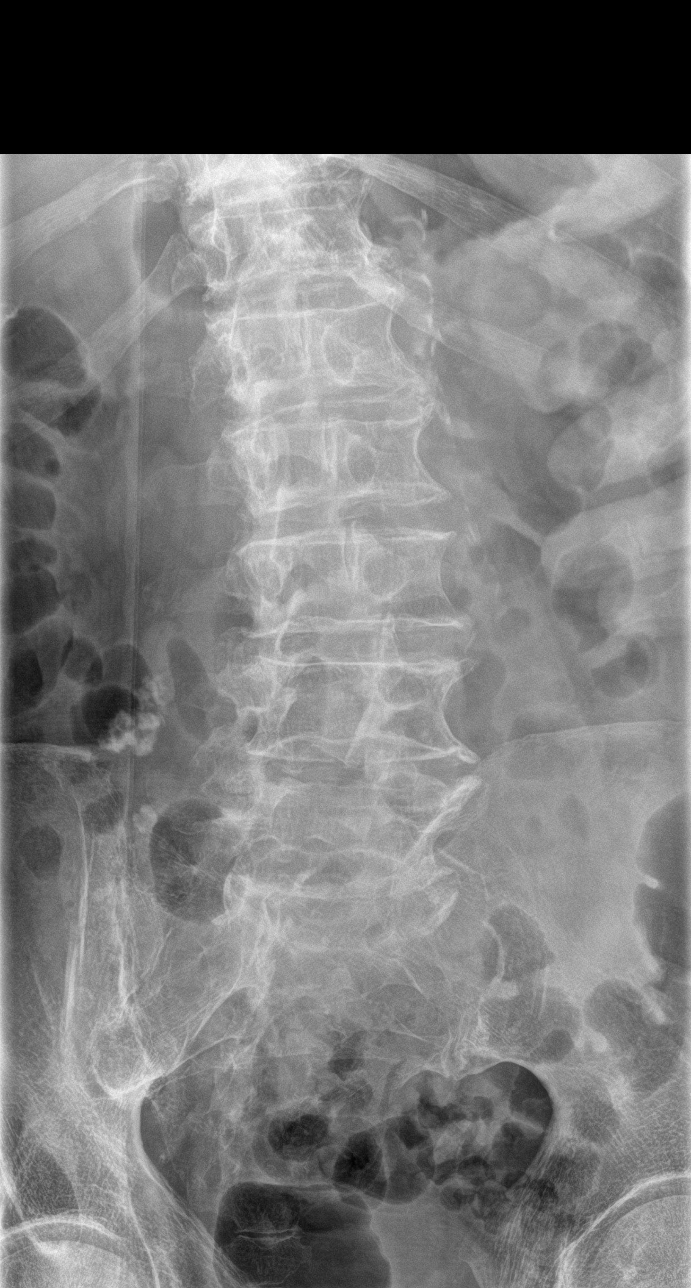

[l-spine lat]
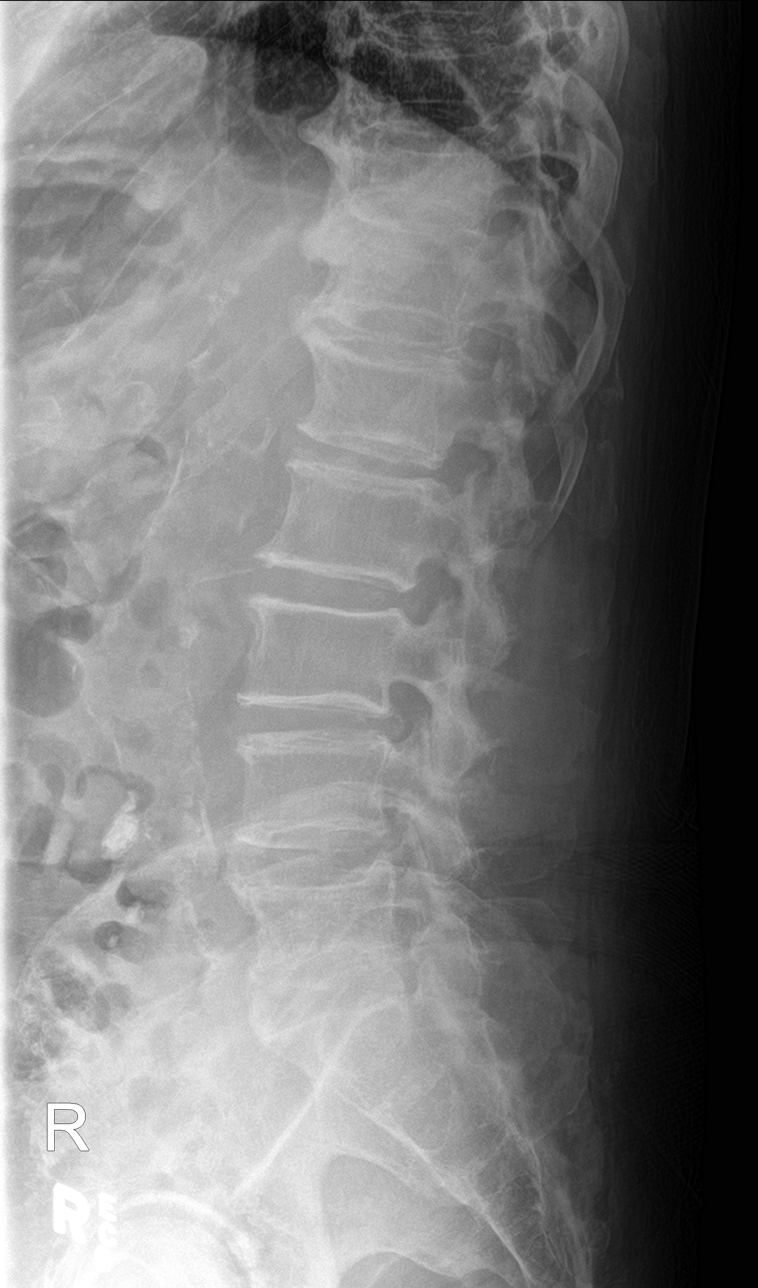

[l-spine spot]
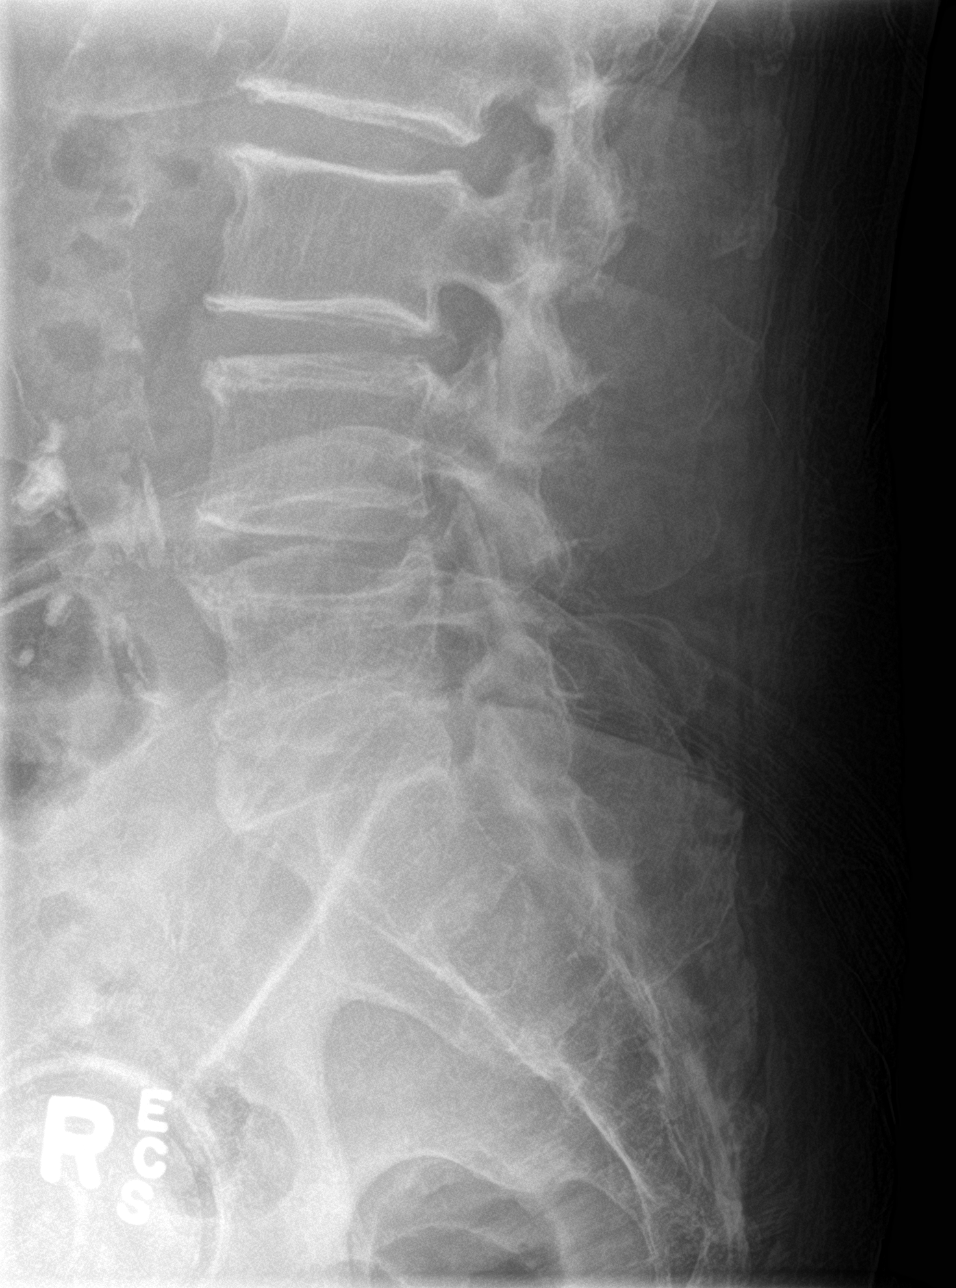

[5 of 5 positions shown; findings below may reference images not displayed]

FINDINGS: Five non rib-bearing lumbar type vertebra. Alignment is within
normal limits. Vertebral body heights are grossly maintained. Mild
diffuse degenerative changes with disc space narrowing and mild
osteophytes. Facet degenerative change of the lower lumbar spine.
IMPRESSION: Degenerative changes. No definite acute osseous abnormality.

## 2019-07-28 NOTE — Patient Care Conference (Signed)
Inpatient RehabilitationTeam Conference and Plan of Care Update Date: 07/28/2019   Time: 3:46 PM    Patient Name: [name]      Medical Record Number: [id number]  Date of Birth: 29-Mar-1933 Sex: Male         Room/Bed: 4W20C/4W20C-01 Payor Info: Payor: [employer] / Plan: [employer] PPO / Product Type: *No Product type* /    Admit Date/Time:  07/23/2019  2:35 PM  Primary Diagnosis:  TBI (traumatic brain injury) [facility])  Patient Active Problem List   Diagnosis Date Noted  . TBI (traumatic brain injury) ([place]) 07/23/2019  . Traumatic closed fracture of distal clavicle with minimal displacement, left, initial encounter 07/19/2019  . Traumatic brain injury with loss of consciousness ([place])   . Multiple trauma   . Benign prostatic hyperplasia   . Essential hypertension   . AKI (acute kidney injury) ([place])   . Stage 3b chronic kidney disease   . Prediabetes   . Acute blood loss anemia   . Thrombocytopenia ([place])   . Fall 07/14/2019  . Prediabetes   . Eosinophilia   . Need for prophylactic vaccination and inoculation against influenza 11/12/2013  . Nocturnal leg cramps 11/23/2012  . Pain in joint, shoulder region 11/23/2012  . Fall 11/04/2012  . Hematoma 11/04/2012  . Rib pain 11/04/2012  . Acute upper respiratory infections of unspecified site 11/04/2012  . HYPERLIPIDEMIA TYPE IIB / III 03/08/2008  . HYPERTENSION, BENIGN 03/08/2008  . CAD, NATIVE VESSEL 03/08/2008    Expected Discharge Date: Expected Discharge Date: 08/11/19  Team Members Present: Physician leading conference: Dr. Leeroy Cha Care Coodinator Present: Dorthula Nettles, RN, BSN, CRRN;Loralee Pacas, LCSWA Nurse Present: Isla Pence, RN PT Present: Apolinar Junes, PT OT Present: Laverle Hobby, OT SLP Present: Weston Anna, SLP PPS Coordinator present : Gunnar Fusi, SLP     Current Status/Progress Goal Weekly Team Focus  Bowel/Bladder   Pt is continent of bowel and bladder.   To remain continent of bowel and bladder.  Assess tolieting needs often   Swallow/Nutrition/ Hydration             ADL's   Max A dressing, poor sequencing, poor adherence to precautions, min A transfers, oriented 3/4  min A- (S)  d/c planning, self care retraining, ADL transfers   Mobility   Min A-CGA overall, gait 60 ft with quad cane, 8-3" steps with R rail  Supervision overall, gait 150 ft and 13 steps to access bed/bath  Actvitity tolerance, balance, functional mobility, gait and stair training, pain management, patient/caregiver education   Communication   Min A, word finding in conversation  Mod I  word finding strategies   Safety/Cognition/ Behavioral Observations  min- supervision A  Mod I  complex problem solving and recall strategies   Pain   Pain level has ranged from 4-10/10. Gets tramadol prn and tylenol is scheduled. Pt also uses K-pad to help relieve pain.  To bring pain level below 2/10.  Assess pain q shift or prn.   Skin   Pt has ecchymosis to left back, hip area and skin tear to left elbow with foam over.  To promote healing and prevent any further breakdown.  Assess skin q shift or prn.      *See Care Plan and progress notes for long and short-term goals.     Barriers to Discharge  Current Status/Progress Possible Resolutions Date Resolved   Nursing  PT  Inaccessible home environment;Home environment access/layout;Lack of/limited family support;Weight bearing restrictions  Patient has 5 STE his home and 13 steps to access his main bedroom and bathroom, patient's wife availabel for 24/7 supervision for a limited time, one of his daughters may be moving for a job and his other daugher has special needs and cannot assist patient at d/c              OT Inaccessible home environment;Weight bearing restrictions  Full bathrooms on second floor of the home             SLP                Care Coordinator                Discharge Planning/Teaching  Needs:   Lack of/limited family support. Family education as recommended by therapy.    Team Discussion:  CBG's are good. Is it possible to reduce Urecholine? Pt requests to work on coordination. Limited by fatigue, a little dizzy when going up steps. BP returns to baseline when pt returns to bed. SLP focusing on cognition and medication management.   Revisions to Treatment Plan:  none    Medical Summary Current Status: 9/10 level of pain in lumbar and thoracic spine, orhtostatic with therapy but with labile BP otherwise Weekly Focus/Goal: Lumbar and thoracic spinal imaging, TID monitoring of BP and adjustment of hypertensives accordingly, TEDs and abdominal binder during therapy  Barriers to Discharge: Home enviroment access/layout;Medical stability  Barriers to Discharge Comments: Inaccessible home environment, high levels of pain, orthostatics, insomnia Possible Resolutions to Barriers: Appropriate DME, tramadol and heat for pain, thoracic and lumbar spinal imaging, TEDs and abdominal binder with therapy   Continued Need for Acute Rehabilitation Level of Care: The patient requires daily medical management by a physician with specialized training in physical medicine and rehabilitation for the following reasons: Direction of a multidisciplinary physical rehabilitation program to maximize functional independence : Yes Medical management of patient stability for increased activity during participation in an intensive rehabilitation regime.: Yes Analysis of laboratory values and/or radiology reports with any subsequent need for medication adjustment and/or medical intervention. : Yes   I attest that I was present, lead the team conference, and concur with the assessment and plan of the team.   Cristi Loron 07/28/2019, 3:46 PM

## 2019-07-28 NOTE — Progress Notes (Signed)
[place] PHYSICAL MEDICINE & REHABILITATION PROGRESS NOTE   Subjective/Complaints: Complaining of 9/10 lower and thoracic back pain. Tramadol does help. Discussed with patient, wife, and PT getting XRs this morning. Orthostatic during PT this morning. Discussed using TEDs and abdominal binder.  Sleeping well at night.   ROS: Patient denies CP, SOB, N/V/D  Objective:   No results found. Recent Labs    07/25/19 1758 07/27/19 0617  WBC 8.1 9.0  HGB 7.4* 7.5*  HCT 23.0* 23.1*  PLT 408* 437*   Recent Labs    07/27/19 0617  NA 139  K 4.4  CL 107  CO2 24  GLUCOSE 93  BUN 24*  CREATININE 1.47*  CALCIUM 8.8*    Intake/Output Summary (Last 24 hours) at 07/28/2019 1024 Last data filed at 07/28/2019 0724 Gross per 24 hour  Intake 700 ml  Output 1300 ml  Net -600 ml     Physical Exam: Vital Signs Blood pressure (!) 144/63, pulse 85, temperature 97.9 F (36.6 C), resp. rate 17, height 5\' 8"  (1.727 m), weight 98 kg, SpO2 96 %.   General: Alert and oriented x 3, No apparent distress HEENT: Head is normocephalic, atraumatic, PERRLA, EOMI, sclera anicteric, oral mucosa pink and moist, dentition intact, ext ear canals clear,  Neck: Supple without JVD or lymphadenopathy Heart: Reg rate and rhythm. No murmurs rubs or gallops Chest: CTA bilaterally without wheezes, rales, or rhonchi; no distress Abdomen: Soft, non-tender, non-distended, bowel sounds positive. Extremities: No clubbing, cyanosis, or edema. Pulses are 2+ Ext: no clubbing, cyanosis, 1+ L>R LE edema Psych: pleasant and cooperative Musculoskeletal:     LUE tender with any movement. Wearing sling, wrist splint in place.   Neurological: alert and oriented to person, place, day of week,mo,year, reason he's here. Normal language. Recalls his street address. Answers biographical questions. Recalled current events in news. RUE 4/5 prox to distal. LUE limited by sling. RLE 3-/5 HF, KE to 4/5 ADF/PF. LLE 2/5 HF, 3/5 KE and  4/5 ADF/PF. Normal sensation   Assessment/Plan: 1. Functional deficits secondary to TBI with polytrauma which require 3+ hours per day of interdisciplinary therapy in a comprehensive inpatient rehab setting.  Physiatrist is providing close team supervision and 24 hour management of active medical problems listed below.  Physiatrist and rehab team continue to assess barriers to discharge/monitor patient progress toward functional and medical goals  Care Tool:  Bathing    Body parts bathed by patient: Left arm, Chest, Abdomen, Front perineal area, Right upper leg, Left upper leg, Face   Body parts bathed by helper: Right arm, Buttocks     Bathing assist Assist Level: Moderate Assistance - Patient 50 - 74%     Upper Body Dressing/Undressing Upper body dressing   What is the patient wearing?: Pull over shirt    Upper body assist Assist Level: Moderate Assistance - Patient 50 - 74%    Lower Body Dressing/Undressing Lower body dressing      What is the patient wearing?: Pants, Underwear/pull up     Lower body assist Assist for lower body dressing: Maximal Assistance - Patient 25 - 49%     Toileting Toileting    Toileting assist Assist for toileting: Moderate Assistance - Patient 50 - 74%     Transfers Chair/bed transfer  Transfers assist     Chair/bed transfer assist level: Contact Guard/Touching assist Chair/bed transfer assistive device: Cane   Locomotion Ambulation   Ambulation assist   Ambulation activity did not occur: Safety/medical concerns  Assist  level: Minimal Assistance - Patient > 75% Assistive device: Cane-quad Max distance: 60'   Walk 10 feet activity   Assist  Walk 10 feet activity did not occur: Safety/medical concerns  Assist level: Minimal Assistance - Patient > 75% Assistive device: Cane-quad   Walk 50 feet activity   Assist Walk 50 feet with 2 turns activity did not occur: Safety/medical concerns  Assist level: Minimal  Assistance - Patient > 75% Assistive device: Cane-quad    Walk 150 feet activity   Assist Walk 150 feet activity did not occur: Safety/medical concerns         Walk 10 feet on uneven surface  activity   Assist Walk 10 feet on uneven surfaces activity did not occur: Safety/medical concerns         Wheelchair     Assist Will patient use wheelchair at discharge?: Yes (Per PT long term goals) Type of Wheelchair: Manual    Wheelchair assist level: Maximal Assistance - Patient 25 - 49% Max wheelchair distance: 50    Wheelchair 50 feet with 2 turns activity    Assist        Assist Level: Moderate Assistance - Patient 50 - 74%   Wheelchair 150 feet activity     Assist      Assist Level: Maximal Assistance - Patient 25 - 49%   Blood pressure (!) 144/63, pulse 85, temperature 97.9 F (36.6 C), resp. rate 17, height 5\' 8"  (1.727 m), weight 98 kg, SpO2 96 %.  Medical Problem List and Plan: 1.  Impaired secondary to TBI             -patient may shower             -ELOS/Goals: S with PT, OT, and SLP  --Continue CIR therapies today including PT, OT, and SLP   -Team conference today.  2.  Antithrombotics: -DVT/anticoagulation:  Pharmaceutical: Lovenox             -antiplatelet therapy:  3. Pain Management:   tylenol scheduled at 650 mg qid.    -Ultram prn for moderate to severe pain (was taking at home)- providing better pain relief.    -Thoracic and Lumbar XR ordered 6/22. Continue heating pads.   -limit robaxin as it may have been contributing to night time confusion and was using frequently at night. Change dosing to q6 PRN and reduce to 500mg  4. Mood: LCSW to follow for evaluation and support.              -antipsychotic agents: N/a  -wife appears very supportive 5. Neuropsych: This patient is capable of making decisions on his own behalf.  -cognition demonstrating substantial improvement  6/18-add melatonin at HS to help with sleep   -check sleep  chart  6/20: Added Amitriptyline 10mg  HS. Advised that he may feel very groggy in the morning. If too much so, will stop medication.   6/22: sleeping better with this.  6. Skin/Wound Care: Routine pressure relief measures.  7. Fluids/Electrolytes/Nutrition: encourage PO. 8. HTN: Monitor BP tid--continue cardura. Lotrel on hold.   6/20: Bp labile. Monitor.   6/22: labile. Orthostatic during therapy. Please use TEDs and abdominal binder during therapy 9. Left thumb dislocation: s/p relocation. Continue spica splint 10. Distal clavicle fracture- continue sling 11. New onset diabetes: Hgb A1c-6.5--Carb modified diet. Will monitor BS ac/hs for now.  12. Acute urinary retention: cardura and urecholine  6/18 -pvr 88 today, continent. Continue to monitor 13.  ABLA:H/H on downward trend       -  hgb: 9.1>8.4> today: 6.4 initially and 7.3 on repeat  -may be dilutional component  -check stool for OB  -pt denies dizziness, fatigue, sob, HR controlled    -Hgb 7.4 on repeat CBC hgb 7.5- stable await stool OB, had first BM today , flushed  14. Acute renal failure on chronic renal failure: SCr 1.97 at admisison:    -BUN/Cr down to 22/1.39 today  -continue to encourage adequate PO  15. Constipation: Patient reporting hard balls yesterday. Stool now loose    LOS: 5 days A FACE TO FACE EVALUATION WAS PERFORMED  Martha Clan P Lehman Whiteley 07/28/2019, 10:24 AM

## 2019-07-28 NOTE — Progress Notes (Signed)
Physical Therapy Session Note  Patient Details  Name: [name] MRN: [id number] Date of Birth: 1933/07/16  Today's Date: 07/28/2019 PT Individual Time: 0800-0910 PT Individual Time Calculation (min): 70 min   Short Term Goals: Week 1:  PT Short Term Goal 1 (Week 1): Pt will perform bed mobiltiy with min assist PT Short Term Goal 2 (Week 1): Pt will transfer to and from [facility] with min assist PT Short Term Goal 3 (Week 1): Pt will ambulate 31ft with min assist and LRAD PT Short Term Goal 4 (Week 1): Pt will initiate stair training  Skilled Therapeutic Interventions/Progress Updates:     Patient sitting in the w/c with his wife in the room upon PT arrival. Patient alert and agreeable to PT session. Patient reported 5-6/10 L rib and shoulder pain and new intermittent 9/10 mid-low back pain with mobility during session, RN and MD made aware. PT provided repositioning, rest breaks, and distraction as pain interventions throughout session. Patient denied numbness or tingling and stated that his back pain was concentrated in the center of his back with some radiating pain to the right side of his back. Noted a large area of dark purple bruising to central lumbar spine along with bruising wrapping around the L hip.  Patient was also orthostatic during session. Patient was tender to palpation only over bruising, no change over bony prominences of vertebrae. Limited lumbar AROM in all directions with pain in sitting and pain with sit to/from stand transfers.   Patient was also orthostatic during session, MD and RN made aware. Orthostatic Vitals:  Sitting: BP 108/69, HR 103 Standing: BP 89/53, HR 95 (symptomatic)  Therapeutic Activity: Bed Mobility: Patient performed sit to supine with mod A for LE management, increased assist due to back pain. Provided verbal cues for performing log rolling for pain management. Transfers: Patient performed sit to/from stand x2 with CGA using quad cane, patient  reported 5-6/10 dizziness/light headedness in standing both times. Dizziness did not resolve in sitting and patient performed stand pivot w/c>bed. Symptoms improved once in supine. Provided verbal cues for hand placement and reaching back to sit during transfers.  Educated patient and his wife about OH and management. Provided patient with knee high TED hose at MD request. Donned TEDs with total A with patient in supine. Noted B pitting edema in both LEs, RN mde aware.  Patient required increased time with all mobility and positioning in bed due to back pain and OH symptoms.   Patient in bed with his wife at bedside at end of session with breaks locked, bed alarm set, and all needs within reach.    Therapy Documentation Precautions:  Precautions Precautions: Fall Precaution Booklet Issued: No Precaution Comments: pt requires verbal cues for WB precautions during session Required Braces or Orthoses: Splint/Cast, Sling Cervical Brace: Hard collar (for comfort, pt does not wear often per pt report) Splint/Cast: thumb spica splint, Hard collar changed to 'for comfort.' Pt has been opting for no hard collar. Other Brace: thumb spica splint L hand  Restrictions Weight Bearing Restrictions: Yes LUE Weight Bearing: Non weight bearing Other Position/Activity Restrictions: L rib fxs 1-7    Therapy/Group: Individual Therapy  Katrinna Travieso L Izac Faulkenberry PT, DPT  07/28/2019, 12:18 PM

## 2019-07-28 NOTE — Progress Notes (Signed)
Patient ID: [name], male   DOB: 18-Nov-1933, 86 y.o.   MRN: [id number]   SW met with pt wife in room while pt was in therapy to provide updates from team conference, and d/c date 08/11/2019. SW to follow-up and discuss with patient.   Loralee Pacas, MSW, [place] Office: [phone] Cell: [phone] Fax: [phone]

## 2019-07-28 NOTE — Progress Notes (Signed)
Occupational Therapy Note  Patient Details  Name: [name] MRN: [id number] Date of Birth: 1933/02/07  Today's Date: 07/28/2019 OT Missed Time: 49 Minutes Missed Time Reason: CT/MRI  Pt missed 30 min 2/2 spinal CT. OT will follow up.   [name] 07/28/2019, 2:25 PM

## 2019-07-28 NOTE — Plan of Care (Signed)
  Problem: Consults Goal: RH BRAIN INJURY PATIENT EDUCATION Description: Description: See Patient Education module for eduction specifics Outcome: Progressing Goal: Skin Care Protocol Initiated - if Braden Score 18 or less Description: If consults are not indicated, leave blank or document N/A Outcome: Progressing   Problem: RH BOWEL ELIMINATION Goal: RH STG MANAGE BOWEL WITH ASSISTANCE Description: STG Manage Bowel with min Assistance. Outcome: Progressing Goal: RH STG MANAGE BOWEL W/MEDICATION W/ASSISTANCE Description: STG Manage Bowel with Medication with mod Assistance. Outcome: Progressing   Problem: RH BLADDER ELIMINATION Goal: RH STG MANAGE BLADDER WITH EQUIPMENT WITH ASSISTANCE Description: STG Manage Bladder With Equipment With mod Assistance Outcome: Progressing   Problem: RH SKIN INTEGRITY Goal: RH STG SKIN FREE OF INFECTION/BREAKDOWN Description: Skin will be free of infection/breakdown with min assist Outcome: Progressing Goal: RH STG MAINTAIN SKIN INTEGRITY WITH ASSISTANCE Description: STG Maintain Skin Integrity With min Assistance. Outcome: Progressing   Problem: RH SAFETY Goal: RH STG ADHERE TO SAFETY PRECAUTIONS W/ASSISTANCE/DEVICE Description: STG Adhere to Safety Precautions With min Assistance/Device. Outcome: Progressing   Problem: RH COGNITION-NURSING Goal: RH STG USES MEMORY AIDS/STRATEGIES W/ASSIST TO PROBLEM SOLVE Description: STG Uses Memory Aids/Strategies With min Assistance to Problem Solve. Outcome: Progressing   Problem: RH PAIN MANAGEMENT Goal: RH STG PAIN MANAGED AT OR BELOW PT'S PAIN GOAL Description: Pain will be reported less than 4 out of 10 with min assit Outcome: Progressing

## 2019-07-28 NOTE — Progress Notes (Signed)
Occupational Therapy Session Note  Patient Details  Name: [name] MRN: [id number] Date of Birth: November 21, 1933  Today's Date: 07/28/2019 OT Individual Time: 3709-6438 OT Individual Time Calculation (min): 54 min    Short Term Goals: Week 1:  OT Short Term Goal 1 (Week 1): Pt will wash LB with LRAD with minA. OT Short Term Goal 2 (Week 1): Pt will don socks and shoes with LRAD with modA. OT Short Term Goal 3 (Week 1): Pt will complete toilet transfer with modA.  Skilled Therapeutic Interventions/Progress Updates:    Pt received supine, reporting need to use the bathroom. No current c/o pain. Discussed OH and TBI with pt and family. Pt completed bed mobility with mod A to raise trunk to EOB. Cueing required for adherence to LUE NWB precautions. Pt BP obtained EOB- 157/65. Pt reporting no s/s of OH once standing. Cueing required for UE placement and use of quad cane. Min A for ambulatory transfer into the bathroom. Mod A for clothing management in standing. Pt able to complete peri hygiene following Bm seated with CGA. Pt stood and pulled up pants with mod A. Pt transferred back to w/c, with flexed knees and back and requiring mod A overall. Cueing required for increased weightbearing through quad cane. BP following- 137/56. Pt agreeable to go down to ADL apt for tub transfer practice. Pt was given edu/demo on use of TTB. Pt attempted to stand and began having 9/10 back pain every time he attempted to move in w/c. Pt was taken back to his room and he transferred back to bed. Pt was positioned in sidelying for back pain relief and heat pack positioned. Pt reported pain was subsiding. Pt awaiting spinal imaging d/t new back pain and OT will follow up after.   Therapy Documentation Precautions:  Precautions Precautions: Fall Precaution Booklet Issued: No Precaution Comments: pt requires verbal cues for WB precautions during session Required Braces or Orthoses: Splint/Cast, Sling Cervical  Brace: Hard collar (for comfort, pt does not wear often per pt report) Splint/Cast: thumb spica splint, Hard collar changed to 'for comfort.' Pt has been opting for no hard collar. Other Brace: thumb spica splint L hand  Restrictions Weight Bearing Restrictions: No LUE Weight Bearing: Non weight bearing Other Position/Activity Restrictions: L rib fxs 1-7   Therapy/Group: Individual Therapy  [name] 07/28/2019, 6:55 AM

## 2019-07-28 NOTE — Progress Notes (Signed)
Speech Language Pathology Daily Session Note  Patient Details  Name: [name] MRN: [id number] Date of Birth: 1933-05-03  Today's Date: 07/28/2019 SLP Individual Time: 5427-0623 SLP Individual Time Calculation (min): 40 min  Short Term Goals: Week 1: SLP Short Term Goal 1 (Week 1): Patient will demonstrate functional problem solving for complex tasks with supervision level verbal cues. SLP Short Term Goal 2 (Week 1): Patient will recall new, daily information with supervision level verbal cues for use of compensatory strategies. SLP Short Term Goal 3 (Week 1): Patient will utilize word-finding strategies at the conversation level with supervision level verbal cues.  Skilled Therapeutic Interventions: Skilled treatment session focused on cognitive goals. SLP facilitated session by providing supervision level verbal cues for complex problem solving while completing a complex medication management task. Patient demonstrated improved word-finding and was overall Mod I for word-finding at the conversation level while discussing informal subject matters. Patient alternated his attention between conversation and the medication management task supervision level verbal cues. Patient left upright in the wheelchair with alarm on and all needs within reach. Continue with current plan of care.      Pain No/Denies Pain   Therapy/Group: Individual Therapy  Elieser Tetrick 07/28/2019, 2:44 PM

## 2019-07-29 ENCOUNTER — Inpatient Hospital Stay (HOSPITAL_COMMUNITY): Payer: PPO

## 2019-07-29 ENCOUNTER — Inpatient Hospital Stay (HOSPITAL_COMMUNITY): Payer: PPO | Admitting: Speech Pathology

## 2019-07-29 LAB — URINALYSIS, ROUTINE W REFLEX MICROSCOPIC
Bacteria, UA: NONE SEEN
Bilirubin Urine: NEGATIVE
Glucose, UA: NEGATIVE mg/dL
Ketones, ur: NEGATIVE mg/dL
Leukocytes,Ua: NEGATIVE
Nitrite: NEGATIVE
Protein, ur: 30 mg/dL — AB
Specific Gravity, Urine: 1.015 (ref 1.005–1.030)
pH: 6 (ref 5.0–8.0)

## 2019-07-29 LAB — LIPID PANEL
Cholesterol: 186 mg/dL (ref 0–200)
HDL: 48 mg/dL (ref >40)
LDL Cholesterol: 115 mg/dL — ABNORMAL HIGH (ref 0–99)
Total CHOL/HDL Ratio: 3.9 RATIO
Triglycerides: 116 mg/dL (ref <150)
VLDL: 23 mg/dL (ref 0–40)

## 2019-07-29 LAB — GLUCOSE, CAPILLARY: Glucose-Capillary: 106 mg/dL — ABNORMAL HIGH (ref 70–99)

## 2019-07-29 NOTE — Progress Notes (Signed)
Physical Therapy Session Note  Patient Details  Name: [name] MRN: [id number] Date of Birth: Dec 12, 1933  Today's Date: 07/29/2019 PT Individual Time: 1115-1135 PT Individual Time Calculation (min): 20 min   Short Term Goals: Week 1:  PT Short Term Goal 1 (Week 1): Pt will perform bed mobiltiy with min assist PT Short Term Goal 2 (Week 1): Pt will transfer to and from [facility] with min assist PT Short Term Goal 3 (Week 1): Pt will ambulate 51ft with min assist and LRAD PT Short Term Goal 4 (Week 1): Pt will initiate stair training  Skilled Therapeutic Interventions/Progress Updates:     Patient in w/c with his wife in the room upon PT arrival. Patient reported 5/10 L UE, rib, and back pain during session, RN made aware. PT provided repositioning, rest breaks, and distraction as pain interventions throughout session. Patient denied dizziness with mobility today, except first time up this morning. Performed brief vision assessment, smooth pursuits and visual tracking WNL overall. Patient was having difficulty keeping his eyes open during assessment due to fatigue from sleeping poorly. Patient requested to get back to bed to try to catch up on sleep due to fatigue.   Therapeutic Activity: Bed Mobility: Patient performed sit to supine with min A for LE management due to back pain. Provided verbal cues for log roll technique to manage back pain with mobility. Transfers: Patient performed stand pivot w/c>bed with min A using bed rail for balance. Provided verbal cues for sequencing.  Educated patient and his wife on importance of rest during recovery from [place]. Encouraged improved nigtht time sleeping to maintain natural sleep/wake cycle. Also discussed with NT, as RN was unavailable.   Patient in bed asleep at end of session with breaks locked, bed alarm set, and all needs within reach. Patient missed 25 min of skilled PT due to fatigue. Will attempt to make-up missed time as able.      Therapy Documentation Precautions:  Precautions Precautions: Fall Precaution Booklet Issued: No Precaution Comments: pt requires verbal cues for WB precautions during session Required Braces or Orthoses: Splint/Cast, Sling Cervical Brace: Hard collar (for comfort, pt does not wear often per pt report) Splint/Cast: thumb spica splint, Hard collar changed to 'for comfort.' Pt has been opting for no hard collar. Other Brace: thumb spica splint L hand  Restrictions Weight Bearing Restrictions: Yes LUE Weight Bearing: Non weight bearing Other Position/Activity Restrictions: L rib fxs 1-7 General: PT Amount of Missed Time (min): 25 Minutes PT Missed Treatment Reason: Pain;Patient fatigue    Therapy/Group: Individual Therapy  Etha Stambaugh L Ed Rayson PT, DPT  07/29/2019, 5:29 PM

## 2019-07-29 NOTE — Progress Notes (Signed)
Entered patients room and wife was assisting patient use the urinal.  Patient complained of burning sensation during voiding.  Noted underwear with small bloodstained area and noticed small amount of blood at tip of penis after urination.  Urine appears dark and concentrated.  Encouraged patient to drink more fluids.  Noticed urinalysis from 6/18 was positive for blood but culture was negative.  Left Algis Liming, PA a message for advice.  Brita Romp, RN

## 2019-07-29 NOTE — Progress Notes (Signed)
Occupational Therapy Session Note  Patient Details  Name: [name] MRN: [id number] Date of Birth: [birth year]  Today's Date: 07/29/2019 OT Individual Time: 0786-7544 OT Individual Time Calculation (min): 54 min    Short Term Goals: Week 1:  OT Short Term Goal 1 (Week 1): Pt will wash LB with LRAD with minA. OT Short Term Goal 2 (Week 1): Pt will don socks and shoes with LRAD with modA. OT Short Term Goal 3 (Week 1): Pt will complete toilet transfer with modA.  Skilled Therapeutic Interventions/Progress Updates:    Pt received supine with wife present. Pt reporting rough night with ongoing back and rib pain, but willing to get OOB for OT session. Pt required tactile cue for hand placement during sit > stand. Min A to power up. Pt used quad cane to complete ambulatory transfer into the bathroom with min A. Minor posterior LOB when ascending ledge into bathroom with min A provided to correct. Pt sat on BSC over toilet, requiring min cueing for sequencing turn with use of cane. Pt then transferred into shower with min A. All bathing performed sit <> stand from shower chair. Min A required for reaching under RUE and for distal LE. Pt demonstrated improved carryover from previous session in re to safety. Pt transferred back to w/c following shower. Min A to don shirt with cueing for hemi technique. Pt used tongs to thread pants, requiring mod A overall to don. Pt completed oral care and hair care at the sink seated. Total A to don teds. Pt passed off to SLP in room.   Pt c/o back spasms/pain throughout session with sit <> stands and reaching forward. Ample rest breaks provided which pt reported alleviated pain.   Therapy Documentation Precautions:  Precautions Precautions: Fall Precaution Booklet Issued: No Precaution Comments: pt requires verbal cues for WB precautions during session Required Braces or Orthoses: Splint/Cast, Sling Cervical Brace: Hard collar (for comfort, pt does not  wear often per pt report) Splint/Cast: thumb spica splint, Hard collar changed to 'for comfort.' Pt has been opting for no hard collar. Other Brace: thumb spica splint L hand  Restrictions Weight Bearing Restrictions: No LUE Weight Bearing: Non weight bearing Other Position/Activity Restrictions: L rib fxs 1-7   Therapy/Group: Individual Therapy  [name] 07/29/2019, 7:53 AM

## 2019-07-29 NOTE — Plan of Care (Signed)
  Problem: Consults Goal: RH BRAIN INJURY PATIENT EDUCATION Description: Description: See Patient Education module for eduction specifics Outcome: Progressing Goal: Skin Care Protocol Initiated - if Braden Score 18 or less Description: If consults are not indicated, leave blank or document N/A Outcome: Progressing   Problem: RH BOWEL ELIMINATION Goal: RH STG MANAGE BOWEL WITH ASSISTANCE Description: STG Manage Bowel with min Assistance. Outcome: Progressing Goal: RH STG MANAGE BOWEL W/MEDICATION W/ASSISTANCE Description: STG Manage Bowel with Medication with mod Assistance. Outcome: Progressing   Problem: RH BLADDER ELIMINATION Goal: RH STG MANAGE BLADDER WITH EQUIPMENT WITH ASSISTANCE Description: STG Manage Bladder With Equipment With mod Assistance Outcome: Progressing   Problem: RH SKIN INTEGRITY Goal: RH STG SKIN FREE OF INFECTION/BREAKDOWN Description: Skin will be free of infection/breakdown with min assist Outcome: Progressing Goal: RH STG MAINTAIN SKIN INTEGRITY WITH ASSISTANCE Description: STG Maintain Skin Integrity With min Assistance. Outcome: Progressing   Problem: RH SAFETY Goal: RH STG ADHERE TO SAFETY PRECAUTIONS W/ASSISTANCE/DEVICE Description: STG Adhere to Safety Precautions With min Assistance/Device. Outcome: Progressing   Problem: RH COGNITION-NURSING Goal: RH STG USES MEMORY AIDS/STRATEGIES W/ASSIST TO PROBLEM SOLVE Description: STG Uses Memory Aids/Strategies With min Assistance to Problem Solve. Outcome: Progressing   Problem: RH PAIN MANAGEMENT Goal: RH STG PAIN MANAGED AT OR BELOW PT'S PAIN GOAL Description: Pain will be reported less than 4 out of 10 with min assit Outcome: Progressing

## 2019-07-29 NOTE — Progress Notes (Signed)
Speech Language Pathology Daily Session Note  Patient Details  Name: SENCERE SYMONETTE MRN: [id number] Date of Birth: [birth year]  Today's Date: 07/29/2019 SLP Individual Time: 1287-8676 SLP Individual Time Calculation (min): 55 min  Short Term Goals: Week 1: SLP Short Term Goal 1 (Week 1): Patient will demonstrate functional problem solving for complex tasks with supervision level verbal cues. SLP Short Term Goal 2 (Week 1): Patient will recall new, daily information with supervision level verbal cues for use of compensatory strategies. SLP Short Term Goal 3 (Week 1): Patient will utilize word-finding strategies at the conversation level with supervision level verbal cues.  Skilled Therapeutic Interventions: Skilled treatment session focused on word-finding goals. 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 Patient with only 1 episode of word-finding difficulty in which he utilized compensatory strategies with Mod I. Patient also reported difficulty with recalling of names, therefore, SLP provided strategies in which patient verbalized understanding and agreement. Patient left upright in the wheelchair with alarm on and all needs within reach. Continue with current plan of care.      Pain No/Denies Pain   Therapy/Group: Individual Therapy  Maelani Yarbro 07/29/2019, 3:32 PM

## 2019-07-29 NOTE — Progress Notes (Signed)
[place] PHYSICAL MEDICINE & REHABILITATION PROGRESS NOTE   Subjective/Complaints: Pain currently well controlled but patient says that it did bother him at night. Tramadol helps but does make him woozy. Reviewed results of XRs with patient: there is degeneration throughout thoracic and lumbar spine. There is facet hypertrophy in the lumbar spine-- advised patient to avoid hyperextension and that he may benefit from outpatient medial branch blocks.   ROS: Patient denies CP, SOB, N/V/D  Objective:   DG Thoracic Spine 2 View  Result Date: 07/28/2019 CLINICAL DATA:  Fall with continued pain EXAM: THORACIC SPINE 2 VIEWS COMPARISON:  07/14/2019 FINDINGS: Thoracic alignment is within normal limits. Vertebral body heights are grossly maintained. Diffuse degenerative osteophytes of the mid to lower thoracic spine. IMPRESSION: Diffuse degenerative changes. No definite acute osseous abnormality. Electronically Signed   By: Donavan Foil M.D.   On: 07/28/2019 18:32   DG Lumbar Spine Complete  Result Date: 07/28/2019 CLINICAL DATA:  [place] 8 days ago multiple broken ribs, broken collarbone continued back pain EXAM: LUMBAR SPINE - COMPLETE 4+ VIEW COMPARISON:  CT 07/14/2019 FINDINGS: Five non rib-bearing lumbar type vertebra. Alignment is within normal limits. Vertebral body heights are grossly maintained. Mild diffuse degenerative changes with disc space narrowing and mild osteophytes. Facet degenerative change of the lower lumbar spine. IMPRESSION: Degenerative changes. No definite acute osseous abnormality. Electronically Signed   By: Donavan Foil M.D.   On: 07/28/2019 18:31   Recent Labs    07/27/19 0617  WBC 9.0  HGB 7.5*  HCT 23.1*  PLT 437*   Recent Labs    07/27/19 0617  NA 139  K 4.4  CL 107  CO2 24  GLUCOSE 93  BUN 24*  CREATININE 1.47*  CALCIUM 8.8*    Intake/Output Summary (Last 24 hours) at 07/29/2019 1249 Last data filed at 07/29/2019 1227 Gross per 24 hour  Intake 736 ml   Output 1025 ml  Net -289 ml     Physical Exam: Vital Signs Blood pressure (!) 145/62, pulse 93, temperature 97.8 F (36.6 C), temperature source Oral, resp. rate 17, height 5\' 8"  (1.727 m), weight 98 kg, SpO2 96 %. General: Alert and oriented x 3, No apparent distress HEENT: Head is normocephalic, atraumatic, PERRLA, EOMI, sclera anicteric, oral mucosa pink and moist, dentition intact, ext ear canals clear,  Neck: Supple without JVD or lymphadenopathy Heart: Reg rate and rhythm. No murmurs rubs or gallops Chest: CTA bilaterally without wheezes, rales, or rhonchi; no distress Abdomen: Soft, non-tender, non-distended, bowel sounds positive. Ext: no clubbing, cyanosis, 1+ L>R LE edema Psych: pleasant and cooperative Musculoskeletal:     LUE tender with any movement. Wearing sling, wrist splint in place.   Neurological: alert and oriented to person, place, day of week,mo,year, reason he's here. Normal language. Recalls his street address. Answers biographical questions. Recalled current events in news. RUE 4/5 prox to distal. LUE limited by sling. RLE 3-/5 HF, KE to 4/5 ADF/PF. LLE 2/5 HF, 3/5 KE and 4/5 ADF/PF. Normal sensation  Assessment/Plan: 1. Functional deficits secondary to TBI with polytrauma which require 3+ hours per day of interdisciplinary therapy in a comprehensive inpatient rehab setting.  Physiatrist is providing close team supervision and 24 hour management of active medical problems listed below.  Physiatrist and rehab team continue to assess barriers to discharge/monitor patient progress toward functional and medical goals  Care Tool:  Bathing    Body parts bathed by patient: Left arm, Chest, Abdomen, Front perineal area, Right upper leg, Left  upper leg, Face, Buttocks   Body parts bathed by helper: Right arm, Right lower leg, Left lower leg     Bathing assist Assist Level: Minimal Assistance - Patient > 75%     Upper Body Dressing/Undressing Upper body  dressing   What is the patient wearing?: Pull over shirt    Upper body assist Assist Level: Minimal Assistance - Patient > 75%    Lower Body Dressing/Undressing Lower body dressing      What is the patient wearing?: Pants, Underwear/pull up     Lower body assist Assist for lower body dressing: Moderate Assistance - Patient 50 - 74%     Toileting Toileting    Toileting assist Assist for toileting: Moderate Assistance - Patient 50 - 74%     Transfers Chair/bed transfer  Transfers assist     Chair/bed transfer assist level: Minimal Assistance - Patient > 75% Chair/bed transfer assistive device: Research officer, political party   Ambulation assist   Ambulation activity did not occur: Safety/medical concerns  Assist level: Minimal Assistance - Patient > 75% Assistive device: Cane-quad Max distance: 60'   Walk 10 feet activity   Assist  Walk 10 feet activity did not occur: Safety/medical concerns  Assist level: Minimal Assistance - Patient > 75% Assistive device: Cane-quad   Walk 50 feet activity   Assist Walk 50 feet with 2 turns activity did not occur: Safety/medical concerns  Assist level: Minimal Assistance - Patient > 75% Assistive device: Cane-quad    Walk 150 feet activity   Assist Walk 150 feet activity did not occur: Safety/medical concerns         Walk 10 feet on uneven surface  activity   Assist Walk 10 feet on uneven surfaces activity did not occur: Safety/medical concerns         Wheelchair     Assist Will patient use wheelchair at discharge?: Yes (Per PT long term goals) Type of Wheelchair: Manual    Wheelchair assist level: Maximal Assistance - Patient 25 - 49% Max wheelchair distance: 50    Wheelchair 50 feet with 2 turns activity    Assist        Assist Level: Moderate Assistance - Patient 50 - 74%   Wheelchair 150 feet activity     Assist      Assist Level: Maximal Assistance - Patient 25 - 49%    Blood pressure (!) 145/62, pulse 93, temperature 97.8 F (36.6 C), temperature source Oral, resp. rate 17, height 5\' 8"  (1.727 m), weight 98 kg, SpO2 96 %.  Medical Problem List and Plan: 1.  Impaired secondary to TBI             -patient may shower             -ELOS/Goals: S with PT, OT, and SLP  --Continue CIR therapies today including PT, OT, and SLP  2.  Antithrombotics: -DVT/anticoagulation:  Pharmaceutical: Lovenox             -antiplatelet therapy:  3. Pain Management:   tylenol scheduled at 650 mg qid.    -Ultram prn for moderate to severe pain (was taking at home)- providing better pain relief.    -Thoracic and Lumbar XR ordered 6/22. Continue heating pads.   6/23: Pain currently well controlled but patient says that it did bother him at night. Tramadol helps but does make him woozy. Reviewed results of XRs with patient: there is degeneration throughout thoracic and lumbar spine. There is facet hypertrophy  in the lumbar spine-- advised patient to avoid hyperextension and that he may benefit from outpatient medial branch blocks.   -limit robaxin as it may have been contributing to night time confusion and was using frequently at night. Change dosing to q6 PRN and reduce to 500mg  4. Mood: LCSW to follow for evaluation and support.              -antipsychotic agents: N/a  -wife appears very supportive 5. Neuropsych: This patient is capable of making decisions on his own behalf.  -cognition demonstrating substantial improvement  6/18-add melatonin at HS to help with sleep   -check sleep chart  6/20: Added Amitriptyline 10mg  HS. Advised that he may feel very groggy in the morning. If too much so, will stop medication.   6/22: sleeping better with this.  6. Skin/Wound Care: Routine pressure relief measures.  7. Fluids/Electrolytes/Nutrition: encourage PO. 8. HTN: Monitor BP tid--continue cardura. Lotrel on hold.   6/20: Bp labile. Monitor.   6/22: labile. Orthostatic during  therapy. Please use TEDs and abdominal binder during therapy  6/23: Pharmacy asked whether amlodipine should be restarted. Discussed that we should continue to hold for now as patient is normotensive 9. Left thumb dislocation: s/p relocation. Continue spica splint 10. Distal clavicle fracture- continue sling 11. New onset diabetes: Hgb A1c-6.5--Carb modified diet. Will monitor BS ac/hs for now.  12. Acute urinary retention: cardura and urecholine  6/18 -pvr 88 today, continent. Continue to monitor 13.  ABLA:H/H on downward trend       -hgb: 9.1>8.4> today: 6.4 initially and 7.3 on repeat  -may be dilutional component  -check stool for OB  -pt denies dizziness, fatigue, sob, HR controlled    -Hgb 7.4 on repeat CBC hgb 7.5- stable await stool OB, had first BM today , flushed  14. Acute renal failure on chronic renal failure: SCr 1.97 at admisison:    -BUN/Cr down to 22/1.39 today  -continue to encourage adequate PO  15. Constipation: Patient reporting hard balls yesterday. Stool now loose 16. HLD: 6/23: Pharmacy discussed with me that patient was on Zetia and Lipitor at home and asked if we should restart this. Past lipid panel reviewed and patient had normal LDL- was performed 2 years ago. Discussed with pharmacy that I would order lipid panel for patient today to assess need for medication.     LOS: 6 days A FACE TO FACE EVALUATION WAS PERFORMED  Izora Ribas 07/29/2019, 12:49 PM

## 2019-07-29 NOTE — Progress Notes (Signed)
Occupational Therapy Session Note  Patient Details  Name: [name] MRN: [id number] Date of Birth: 18-Jul-1933  Today's Date: 07/29/2019 OT Individual Time: 1445-1515 OT Individual Time Calculation (min): 30 min    Short Term Goals: Week 1:  OT Short Term Goal 1 (Week 1): Pt will wash LB with LRAD with minA. OT Short Term Goal 2 (Week 1): Pt will don socks and shoes with LRAD with modA. OT Short Term Goal 3 (Week 1): Pt will complete toilet transfer with modA.  Skilled Therapeutic Interventions/Progress Updates:    1:1. Pt received in w/c agreeable to OT. Pt with 4/10 pain in ribs. Pt completes LB dressing simulation with toilet tongs threading BLE into pillowcase. Pt requires VC for strategy. Pt completes standing bean bag toss with MIN A for standing balance reaching in mod ranges outisde BOS to work dynamic balanc req for ADLs. Exited session with pt seated in bed, exit alarm on and call lightin reac  Therapy Documentation Precautions:  Precautions Precautions: Fall Precaution Booklet Issued: No Precaution Comments: pt requires verbal cues for WB precautions during session Required Braces or Orthoses: Splint/Cast, Sling Cervical Brace: Hard collar (for comfort, pt does not wear often per pt report) Splint/Cast: thumb spica splint, Hard collar changed to 'for comfort.' Pt has been opting for no hard collar. Other Brace: thumb spica splint L hand  Restrictions Weight Bearing Restrictions: Yes LUE Weight Bearing: Non weight bearing Other Position/Activity Restrictions: L rib fxs 1-7 General:   Vital Signs: Therapy Vitals Temp: 98 F (36.7 C) Pulse Rate: 90 Resp: (!) 22 BP: (!) 124/50 Patient Position (if appropriate): Lying Oxygen Therapy SpO2: 94 % O2 Device: Room Air Pain:   ADL: ADL Grooming: Minimal assistance Where Assessed-Grooming: Sitting at sink, Wheelchair Upper Body Bathing: Moderate assistance Where Assessed-Upper Body Bathing: Sitting at sink,  Wheelchair Lower Body Bathing: Maximal assistance Where Assessed-Lower Body Bathing: Standing at sink, Sitting at sink Upper Body Dressing: Maximal assistance Where Assessed-Upper Body Dressing: Sitting at sink Lower Body Dressing: Maximal assistance Where Assessed-Lower Body Dressing: Sitting at sink, Standing at sink Vision   Perception    Praxis   Exercises:   Other Treatments:     Therapy/Group: Individual Therapy  [name] 07/29/2019, 3:15 PM

## 2019-07-30 ENCOUNTER — Inpatient Hospital Stay (HOSPITAL_COMMUNITY): Payer: PPO

## 2019-07-30 ENCOUNTER — Inpatient Hospital Stay (HOSPITAL_COMMUNITY): Payer: PPO | Admitting: *Deleted

## 2019-07-30 ENCOUNTER — Inpatient Hospital Stay (HOSPITAL_COMMUNITY): Payer: PPO | Admitting: Occupational Therapy

## 2019-07-30 ENCOUNTER — Inpatient Hospital Stay (HOSPITAL_COMMUNITY): Payer: PPO | Admitting: Speech Pathology

## 2019-07-30 LAB — URINE CULTURE: Culture: NO GROWTH

## 2019-07-30 NOTE — Progress Notes (Signed)
Occupational Therapy Session Note  Patient Details  Name: [name] MRN: [id number] Date of Birth: 07-Jan-1934  Today's Date: 07/30/2019 OT Individual Time: 1302-1330 OT Individual Time Calculation (min): 28 min   Short Term Goals: Week 2:  OT Short Term Goal 1 (Week 2): Pt will groom in standing with CGA to demo improved standing tolerance OT Short Term Goal 2 (Week 2): Pt will wash 10/10 body parts with AE PRN and CGA OT Short Term Goal 3 (Week 2): Pt will doff/don shirt with S and no more than min VC OT Short Term Goal 4 (Week 2): Pt will don footwear with no more than MIN A and MIN VC for AE technique  Skilled Therapeutic Interventions/Progress Updates:    Pt greeted in bathroom with nurse tech, standing to urinate, handoff to OT. Pt needed min A to advance pants over hip on L side after toileting. Pt then ambulated out of bathroom with min HHA and washed his hand. Pt then ambulated to dayroom with quad cane and CGA. Worked on dynamic standing balance with step and follow through when tossing corn hold bean bag. Incorporated trunk rotation by having pt reach behind to collect bean bags. Pt ambulated back to room in similar fashion and pt left seated in wc with chair alarm on, call bell in reach, and needs met.   Therapy Documentation Precautions:  Precautions Precautions: Fall Precaution Booklet Issued: No Precaution Comments: pt requires verbal cues for WB precautions during session Required Braces or Orthoses: Splint/Cast, Sling Cervical Brace: Hard collar (for comfort, pt does not wear often per pt report) Splint/Cast: thumb spica splint, Hard collar changed to 'for comfort.' Pt has been opting for no hard collar. Other Brace: thumb spica splint L hand  Restrictions Weight Bearing Restrictions: Yes LUE Weight Bearing: Non weight bearing Other Position/Activity Restrictions: L rib fxs 1-7 Pain:  pt reports back pain at 6/10. OT repositioned pt with some improved pain. Pt  also reports he recently received medications   Therapy/Group: Individual Therapy  Valma Cava 07/30/2019, 1:32 PM

## 2019-07-30 NOTE — Progress Notes (Signed)
Occupational Therapy Weekly Progress Note  Patient Details  Name: [name] MRN: [id number] Date of Birth: March 17, 1933  Beginning of progress report period: July 24, 2019 End of progress report period: July 30, 2019  Today's Date: 07/30/2019 OT Individual Time: 6433-2951 OT Individual Time Calculation (min): 45 min    Patient has met 3 of 3 short term goals.  Pt is making good progress this reporting period functioning overall at MIN A level for ADLs and transfers. Pt has begun AE training to don footwear and pants but requires mod-max VC for technique. Pt has improved balance to overall MIN A level during ADLs. Caregiver training will likely be completed for AE training and mobility training in prep for DC.  Patient continues to demonstrate the following deficits: muscle weakness, decreased cardiorespiratoy endurance, impaired timing and sequencing and decreased coordination, decreased problem solving, decreased safety awareness and decreased memory and decreased standing balance, decreased postural control, decreased balance strategies and difficulty maintaining precautions and therefore will continue to benefit from skilled OT intervention to enhance overall performance with BADL and iADL.  Patient progressing toward long term goals..  Continue plan of care.  OT Short Term Goals Week 1:  OT Short Term Goal 1 (Week 1): Pt will wash LB with LRAD with minA. OT Short Term Goal 1 - Progress (Week 1): Met OT Short Term Goal 2 (Week 1): Pt will don socks and shoes with LRAD with modA. OT Short Term Goal 2 - Progress (Week 1): Met OT Short Term Goal 3 (Week 1): Pt will complete toilet transfer with modA. OT Short Term Goal 3 - Progress (Week 1): Met Week 2:  OT Short Term Goal 1 (Week 2): Pt will groom in standing with CGA to demo improved standing tolerance OT Short Term Goal 2 (Week 2): Pt will wash 10/10 body parts with AE PRN and CGA OT Short Term Goal 3 (Week 2): Pt will doff/don  shirt with S and no more than min VC OT Short Term Goal 4 (Week 2): Pt will don footwear with no more than MIN A and MIN VC for AE technique  Skilled Therapeutic Interventions/Progress Updates:     1;1. Pt received in bed agreeable to OT. Pt with mild pain the ribs. Pt completes stand pivot transfer to w/c with no AD and use of bed rail with CGA. Pt completes grooming at sink with set up. Pt changes shirt with supervisionand min VC for through finishing of steps of hemi technqiue (pt able to voice each step but unable to finish completely prior to advancing onto next step). Pt able to thread BLE into pants with reacher and decreased coordination of reacher. Pt advances patns past hips with MIN A in standing for last 25% of L hip. Pt dons socks with sock aide and mod VC for technique and shoes with MOD A overall to place shoe funnel onto shoe d/t time management. Exited session with pt seate din w/c, exit alarm on and call light in reach  Therapy Documentation Precautions:  Precautions Precautions: Fall Precaution Booklet Issued: No Precaution Comments: pt requires verbal cues for WB precautions during session Required Braces or Orthoses: Splint/Cast, Sling Cervical Brace: Hard collar (for comfort, pt does not wear often per pt report) Splint/Cast: thumb spica splint, Hard collar changed to 'for comfort.' Pt has been opting for no hard collar. Other Brace: thumb spica splint L hand  Restrictions Weight Bearing Restrictions: Yes LUE Weight Bearing: Non weight bearing Other Position/Activity Restrictions: L  rib fxs 1-7 General:   Vital Signs: Therapy Vitals Temp: 97.8 F (36.6 C) Pulse Rate: 93 BP: (!) 135/58 Patient Position (if appropriate): Lying Oxygen Therapy SpO2: 95 % O2 Device: Room Air Pain:   ADL: ADL Grooming: Minimal assistance Where Assessed-Grooming: Sitting at sink, Wheelchair Upper Body Bathing: Moderate assistance Where Assessed-Upper Body Bathing: Sitting at  sink, Wheelchair Lower Body Bathing: Maximal assistance Where Assessed-Lower Body Bathing: Standing at sink, Sitting at sink Upper Body Dressing: Maximal assistance Where Assessed-Upper Body Dressing: Sitting at sink Lower Body Dressing: Maximal assistance Where Assessed-Lower Body Dressing: Sitting at sink, Standing at sink Vision   Perception    Praxis   Exercises:   Other Treatments:     Therapy/Group: Individual Therapy  [name] 07/30/2019, 6:29 AM

## 2019-07-30 NOTE — Progress Notes (Signed)
[facility] PHYSICAL MEDICINE & REHABILITATION PROGRESS NOTE   Subjective/Complaints: Pain better controlled. Did well in PT today with Cherie. Pressures held better with TEDs and abdominal binder.   ROS: Patient denies CP, SOB, N/V/D  Objective:   DG Thoracic Spine 2 View  Result Date: 07/28/2019 CLINICAL DATA:  Fall with continued pain EXAM: THORACIC SPINE 2 VIEWS COMPARISON:  07/14/2019 FINDINGS: Thoracic alignment is within normal limits. Vertebral body heights are grossly maintained. Diffuse degenerative osteophytes of the mid to lower thoracic spine. IMPRESSION: Diffuse degenerative changes. No definite acute osseous abnormality. Electronically Signed   By: Donavan Foil M.D.   On: 07/28/2019 18:32   DG Lumbar Spine Complete  Result Date: 07/28/2019 CLINICAL DATA:  [place] 8 days ago multiple broken ribs, broken collarbone continued back pain EXAM: LUMBAR SPINE - COMPLETE 4+ VIEW COMPARISON:  CT 07/14/2019 FINDINGS: Five non rib-bearing lumbar type vertebra. Alignment is within normal limits. Vertebral body heights are grossly maintained. Mild diffuse degenerative changes with disc space narrowing and mild osteophytes. Facet degenerative change of the lower lumbar spine. IMPRESSION: Degenerative changes. No definite acute osseous abnormality. Electronically Signed   By: Donavan Foil M.D.   On: 07/28/2019 18:31   No results for input(s): WBC, HGB, HCT, PLT in the last 72 hours. No results for input(s): NA, K, CL, CO2, GLUCOSE, BUN, CREATININE, CALCIUM in the last 72 hours.  Intake/Output Summary (Last 24 hours) at 07/30/2019 1133 Last data filed at 07/30/2019 1000 Gross per 24 hour  Intake 660 ml  Output 650 ml  Net 10 ml     Physical Exam: Vital Signs Blood pressure (!) 135/58, pulse 93, temperature 97.8 F (36.6 C), resp. rate 18, height 5\' 8"  (1.727 m), weight 98 kg, SpO2 95 %. General: Alert and oriented x 3, No apparent distress, wheeling back to room with PT HEENT: Head is  normocephalic, atraumatic, PERRLA, EOMI, sclera anicteric, oral mucosa pink and moist, dentition intact, ext ear canals clear,  Neck: Supple without JVD or lymphadenopathy Heart: Reg rate and rhythm. No murmurs rubs or gallops Chest: CTA bilaterally without wheezes, rales, or rhonchi; no distress Abdomen: Soft, non-tender, non-distended, bowel sounds positive. Ext: no clubbing, cyanosis, 1+ L>R LE edema Psych: pleasant and cooperative, positive mood.  Musculoskeletal:     LUE tender with any movement. Wearing sling, wrist splint in place.   Neurological: alert and oriented to person, place, day of week,mo,year, reason he's here. Normal language. Recalls his street address. Answers biographical questions. Recalled current events in news. RUE 4/5 prox to distal. LUE limited by sling. RLE 3-/5 HF, KE to 4/5 ADF/PF. LLE 2/5 HF, 3/5 KE and 4/5 ADF/PF. Normal sensation  Assessment/Plan: 1. Functional deficits secondary to TBI with polytrauma which require 3+ hours per day of interdisciplinary therapy in a comprehensive inpatient rehab setting.  Physiatrist is providing close team supervision and 24 hour management of active medical problems listed below.  Physiatrist and rehab team continue to assess barriers to discharge/monitor patient progress toward functional and medical goals  Care Tool:  Bathing    Body parts bathed by patient: Left arm, Chest, Abdomen, Front perineal area, Right upper leg, Left upper leg, Face, Buttocks   Body parts bathed by helper: Right arm, Right lower leg, Left lower leg     Bathing assist Assist Level: Minimal Assistance - Patient > 75%     Upper Body Dressing/Undressing Upper body dressing   What is the patient wearing?: Pull over shirt    Upper body  assist Assist Level: Minimal Assistance - Patient > 75%    Lower Body Dressing/Undressing Lower body dressing      What is the patient wearing?: Pants, Underwear/pull up     Lower body assist Assist  for lower body dressing: Moderate Assistance - Patient 50 - 74%     Toileting Toileting    Toileting assist Assist for toileting: Moderate Assistance - Patient 50 - 74%     Transfers Chair/bed transfer  Transfers assist     Chair/bed transfer assist level: Minimal Assistance - Patient > 75% Chair/bed transfer assistive device: Research officer, political party   Ambulation assist   Ambulation activity did not occur: Safety/medical concerns  Assist level: Minimal Assistance - Patient > 75% Assistive device: Cane-quad Max distance: 60'   Walk 10 feet activity   Assist  Walk 10 feet activity did not occur: Safety/medical concerns  Assist level: Minimal Assistance - Patient > 75% Assistive device: Cane-quad   Walk 50 feet activity   Assist Walk 50 feet with 2 turns activity did not occur: Safety/medical concerns  Assist level: Minimal Assistance - Patient > 75% Assistive device: Cane-quad    Walk 150 feet activity   Assist Walk 150 feet activity did not occur: Safety/medical concerns         Walk 10 feet on uneven surface  activity   Assist Walk 10 feet on uneven surfaces activity did not occur: Safety/medical concerns         Wheelchair     Assist Will patient use wheelchair at discharge?: Yes (Per PT long term goals) Type of Wheelchair: Manual    Wheelchair assist level: Maximal Assistance - Patient 25 - 49% Max wheelchair distance: 50    Wheelchair 50 feet with 2 turns activity    Assist        Assist Level: Moderate Assistance - Patient 50 - 74%   Wheelchair 150 feet activity     Assist      Assist Level: Maximal Assistance - Patient 25 - 49%   Blood pressure (!) 135/58, pulse 93, temperature 97.8 F (36.6 C), resp. rate 18, height 5\' 8"  (1.727 m), weight 98 kg, SpO2 95 %.  Medical Problem List and Plan: 1.  Impaired secondary to TBI             -patient may shower             -ELOS/Goals: S with PT, OT, and  SLP  --Continue CIR therapies today including PT, OT, and SLP  2.  Antithrombotics: -DVT/anticoagulation:  Pharmaceutical: Lovenox. Not ambulating.             -antiplatelet therapy:  3. Pain Management:   tylenol scheduled at 650 mg qid.    -Ultram prn for moderate to severe pain (was taking at home)- providing better pain relief.    -Thoracic and Lumbar XR ordered 6/22. Continue heating pads.   6/23: Pain currently well controlled but patient says that it did bother him at night. Tramadol helps but does make him woozy. Reviewed results of XRs with patient: there is degeneration throughout thoracic and lumbar spine. There is facet hypertrophy in the lumbar spine-- advised patient to avoid hyperextension and that he may benefit from outpatient medial branch blocks.   6/24: pain better controlled.   -limit robaxin as it may have been contributing to night time confusion and was using frequently at night. Change dosing to q6 PRN and reduce to 500mg  4. Mood: LCSW to follow  for evaluation and support.              -antipsychotic agents: N/a  -wife appears very supportive 5. Neuropsych: This patient is capable of making decisions on his own behalf.  -cognition demonstrating substantial improvement  6/18-add melatonin at HS to help with sleep   -check sleep chart  6/20: Added Amitriptyline 10mg  HS. Advised that he may feel very groggy in the morning. If too much so, will stop medication.   6/22: sleeping better with this.  6. Skin/Wound Care: Routine pressure relief measures.  7. Fluids/Electrolytes/Nutrition: encourage PO. 8. HTN: Monitor BP tid--continue cardura. Lotrel on hold.   6/20: Bp labile. Monitor.   6/22: labile. Orthostatic during therapy. Please use TEDs and abdominal binder during therapy  6/23: Pharmacy asked whether amlodipine should be restarted. Discussed that we should continue to hold for now as patient is normotensive  6/24: well controlled. Less orthostatic in therapy with  TEDs and abdominal binder.  9. Left thumb dislocation: s/p relocation. Continue spica splint 10. Distal clavicle fracture- continue sling 11. New onset diabetes: Hgb A1c-6.5--Carb modified diet. Will monitor BS ac/hs for now.  12. Acute urinary retention: cardura and urecholine  6/18 -pvr 88 today, continent. Continue to monitor 13.  ABLA:H/H on downward trend       -hgb: 9.1>8.4> today: 6.4 initially and 7.3 on repeat  -may be dilutional component  -check stool for OB  -pt denies dizziness, fatigue, sob, HR controlled    -Hgb 7.4 on repeat CBC hgb 7.5- stable await stool OB, had first BM today , flushed  14. Acute renal failure on chronic renal failure: SCr 1.97 at admisison:    -BUN/Cr down to 22/1.39 today  -continue to encourage adequate PO  15. Constipation: Patient reporting hard balls yesterday. Stool now loose 16. HLD: 6/23: Pharmacy discussed with me that patient was on Zetia and Lipitor at home and asked if we should restart this. Past lipid panel reviewed and patient had normal LDL- was performed 2 years ago. Discussed with pharmacy that I would order lipid panel for patient today to assess need for medication.     LOS: 7 days A FACE TO FACE EVALUATION WAS PERFORMED  [name] 07/30/2019, 11:33 AM

## 2019-07-30 NOTE — Progress Notes (Signed)
Recreational Therapy Assessment and Plan  Patient Details  Name: [name] MRN: [id number] Date of Birth: [birth year] Today's Date: 07/30/2019  Rehab Potential:  Good ELOS:   d/c 7/6  Assessment   Problem List:      Patient Active Problem List   Diagnosis Date Noted  . TBI (traumatic brain injury) ([place]) 07/23/2019  . Traumatic closed fracture of distal clavicle with minimal displacement, left, initial encounter 07/19/2019  . Traumatic brain injury with loss of consciousness ([place])   . Multiple trauma   . Benign prostatic hyperplasia   . Essential hypertension   . AKI (acute kidney injury) ([place])   . Stage 3b chronic kidney disease   . Prediabetes   . Acute blood loss anemia   . Thrombocytopenia ([place])   . Fall 07/14/2019  . Prediabetes   . Eosinophilia   . Need for prophylactic vaccination and inoculation against influenza 11/12/2013  . Nocturnal leg cramps 11/23/2012  . Pain in joint, shoulder region 11/23/2012  . Fall 11/04/2012  . Hematoma 11/04/2012  . Rib pain 11/04/2012  . Acute upper respiratory infections of unspecified site 11/04/2012  . HYPERLIPIDEMIA TYPE IIB / III 03/08/2008  . HYPERTENSION, BENIGN 03/08/2008  . CAD, NATIVE VESSEL 03/08/2008    Past Medical History:      Past Medical History:  Diagnosis Date  . Allergy   . BPH (benign prostatic hyperplasia)   . BPH (benign prostatic hypertrophy)   . CAD (coronary artery disease)    s/p cypher DES to pLAD 6/08; normal LVF;  ETT-Myoview 2009: no ischemia   . Coronary artery disease   . Eosinophilia   . High cholesterol   . HTN (hypertension)   . Hyperlipidemia   . Hypertension   . MI (myocardial infarction) ([place])   . Myocardial infarction ([place])   . Prediabetes   . Trigeminal neuralgia    Past Surgical History:  Past Surgical History:  Procedure Laterality Date  . APPENDECTOMY    . CARDIAC CATHETERIZATION  07/30/2006   CORONARY ANGIOPLASTY WITH STENT  PLACEMENT  . CARDIAC CATHETERIZATION    . EXPLORATORY LAPAROTOMY     age 86  . EXPLORATORY LAPAROTOMY      Assessment & Plan Clinical Impression: Patient is a 86 year old male with history of BPH, CAD, HTN, CKD III, prediabetes, mild cognitive impairment, prior falls who was admitted on 07/14/19 after unwitnessed fall down stairs. He had amnesia of events and reports of headache, back and chest pain. CT of the head showed small volume SAH on left scalp contusion, trace left PTX with left 1st-7th rib fractures, mild LUL contusion, 3.1 cm AAA, distal clavicle fracture, dislocation of left interphalangeal joint fourth digit and possible avulsion fracture of MCP joint with advanced degenerative changes. Dr. Cyndy Freeze consulted for input and recommended conservative management with repeat head CT if neurological decline noted. Cervical spine CT showed mild multilevel DDD. CT of lumbar spine showed contusion left flank and paraspinal musculature with multilevel DDD and mild to moderate canal stenosis L3/L4.  Ortho recommended sling with immobilization LUE and to follow-up with Dr. Doreatha Martin in 2 weeks. Left thumb was closed reduced and splinted--to be NWB LUE. Patient has had issues with pain as well as confusion with is resolving. Acute on chronic renal failure with rise in SCr to 2.2 treated with IVF and lotrel d/c. Foley in place due to urinary retention but cardura has been held intermittently. He was started on urecholine on 6/14 . Confusion at nights due  to sundowning improving with wife staying to provide support and reorientation. Pain control has greatly improved and he is showing improvement in activity tolerance with therapy. Patient transferred to CIR on 07/23/2019 .   Pt presents with decreased activity tolerance, decreased functional mobility, decreased balance, decreased coordination, decreased problem solving, decreased awareness, decreased memory Limiting pt's independence with  leisure/community pursuits.  Met with pt to discuss TR services, leisure interests, community pursuits, social, emotional and spiritual health and their impact/role on physical health.  Plan Min 1 TR session per week >20 minutes  Recommendations for other services: None   Discharge Criteria: Patient will be discharged from TR if patient refuses treatment 3 consecutive times without medical reason.  If treatment goals not met, if there is a change in medical status, if patient makes no progress towards goals or if patient is discharged from hospital.  The above assessment, treatment plan, treatment alternatives and goals were discussed and mutually agreed upon: by patient  [place] 07/30/2019, 3:49 PM

## 2019-07-30 NOTE — Progress Notes (Signed)
Physical Therapy Weekly Progress Note  Patient Details  Name: [name] MRN: [id number] Date of Birth: June 03, 1933  Beginning of progress report period: July 24, 2019 End of progress report period: July 30, 2019  Today's Date: 07/30/2019 PT Individual Time: 1005-1120 PT Individual Time Calculation (min): 75 min   Patient has met 3 of 4 short term goals.  Patient has made intermittent progress with mobility this week, limited by new back pain and mild OH 2 days this week. He currently requires min A for bed mobility due to back pain, CGA-supervision for transfers, min A-CGA gait 80 ft, and has initiated stair training on 4-6" steps holding R rail with min A-CGA.  Patient continues to demonstrate the following deficits muscle weakness and muscle joint tightness, decreased cardiorespiratoy endurance, decreased attention, decreased problem solving and decreased memory and decreased sitting balance, decreased standing balance, decreased postural control, decreased balance strategies and difficulty maintaining precautions and therefore will continue to benefit from skilled PT intervention to increase functional independence with mobility.  Patient progressing toward long term goals..  Continue plan of care.  PT Short Term Goals Week 1:  PT Short Term Goal 1 (Week 1): Pt will perform bed mobiltiy with min assist PT Short Term Goal 1 - Progress (Week 1): Progressing toward goal PT Short Term Goal 2 (Week 1): Pt will transfer to and from [facility] with min assist PT Short Term Goal 2 - Progress (Week 1): Met PT Short Term Goal 3 (Week 1): Pt will ambulate 89f with min assist and LRAD PT Short Term Goal 3 - Progress (Week 1): Met PT Short Term Goal 4 (Week 1): Pt will initiate stair training PT Short Term Goal 4 - Progress (Week 1): Met Week 2:  PT Short Term Goal 1 (Week 2): Patient will perform bed mobility with supervision. PT Short Term Goal 2 (Week 2): Patient will ambulate >125 ft  consistently CGA without AD. PT Short Term Goal 3 (Week 2): Patient will tolerate dynamic standing balance >5 min with min A. PT Short Term Goal 4 (Week 2): Patient will perfrom 8-6" steps using R rail with CGA.  Skilled Therapeutic Interventions/Progress Updates:     Patient in w/c with his wife in the room upon PT arrival. Patient alert and agreeable to PT session, reported feeling better today and that he slept well last night. Patient reported 5/10 L rib pain and 2/10 L shoulder and hip pain during session, RN made aware. PT provided repositioning, rest breaks, and distraction as pain interventions throughout session.   Therapeutic Activity: Transfers: Patient performed stand pivot transfers x3 and sit to/from stand x7 with CGA-close supervision (progressed following NMR, see below). Provided verbal cues for slow controlled movements to prevent back spasms, reaching back to sit for safety, and increased hip and trunk extension in standing for improved posture and balance.  Gait Training:  Patient performed progressive gait training in front of a mirror for visual feedback: -Ambulated 20 feet forwards and backwards x1 using quad cane with CGA, required cues for sequencing for AD and ambulated with step-to gait pattern, decreased step length and height, and slow gait speed -Ambulated 20 feet forwards and backwards x1 without AD, progressed from step-to to step-through gait pattern with increased step length with cues, provided assist for balance and facilitation of lateral weight shift to stance leg  -Performed slow standing alternating marching without UE support, focused on weight shift between feet with progressive increase of knee hight for prolonged SLS to  promote weight shift and increased step length with gait -Ambulated 20 feet forwards and backwards x3 without AD with min A progressing to intermittent CGA with improved balance and decreased facilitation for weight shift, provided cues for  increased step length and R arm swing to promote balance -Ambulated 80 feet and 74 feet without AD or mirror for feedback with min A-CGA for facilitation/tactile cues for weight shift and balance. Ambulated with step through gait pattern, improved step length, improved weight shift to stance leg, increased R UE arm swing, and improved posture without external feedback. Noted patient's appropriate response to internal feedback with subtle gait deviations.   Patient ascended/descended 4-6" steps x2 using R rail with min A on first trial and min A-CGA on second trial. Assist for balance and boosting up on step. Performed step-to gait pattern. Provided cues and demonstration for technique and sequencing.   Rest breaks provided to patient between each activity due to decreased activity tolerance. PT provided summative feedback for previous activity and demonstrated the next activity during rest breaks.  Neuromuscular Re-ed: Patient performed the following LE motor control activities: -sit to/from stand x12 without UE support focused on forward weight shift and obtaining erect posture at top of stand with mirror in front for visual feedback and tactile cues for forward weight shift, progressed from min A-close supervision for safety for increased balance and LE motor control during a functional activity -propelled w/c with LEs for LE motor control with reciprocal motion during active rest breaks between standing activities x50 feet and x150 feet  Educated patient on general BI symptoms and cognitive deficits including memory deficits, increased mental fatigue, overstimulation, and emotional lability. Patient reports having experienced each of these to varying degrees. Provided strategies for management of symptoms and encouraged breaks with low stimulation environment throughout the day. Patient receptive and appreciative of education.  Patient in w/c in the room at end of session with breaks locked, seat  belt alarm set, and all needs within reach.    Therapy Documentation Precautions:  Precautions Precautions: Fall Precaution Booklet Issued: No Precaution Comments: pt requires verbal cues for WB precautions during session Required Braces or Orthoses: Splint/Cast, Sling Cervical Brace: Hard collar (for comfort, pt does not wear often per pt report) Splint/Cast: thumb spica splint, Hard collar changed to 'for comfort.' Pt has been opting for no hard collar. Other Brace: thumb spica splint L hand  Restrictions Weight Bearing Restrictions: Yes LUE Weight Bearing: Non weight bearing Other Position/Activity Restrictions: L rib fxs 1-7  Therapy/Group: Individual Therapy  Guerin Lashomb L Ketura Sirek PT, DPT  07/30/2019, 7:16 PM

## 2019-07-30 NOTE — Progress Notes (Signed)
Speech Language Pathology Daily Session Note  Patient Details  Name: SHAH INSLEY MRN: [id number] Date of Birth: 08-08-1933  Today's Date: 07/30/2019 SLP Individual Time: 1400-1500 SLP Individual Time Calculation (min): 60 min  Short Term Goals: Week 1: SLP Short Term Goal 1 (Week 1): Patient will demonstrate functional problem solving for complex tasks with supervision level verbal cues. SLP Short Term Goal 2 (Week 1): Patient will recall new, daily information with supervision level verbal cues for use of compensatory strategies. SLP Short Term Goal 3 (Week 1): Patient will utilize word-finding strategies at the conversation level with supervision level verbal cues.  Skilled Therapeutic Interventions: Skilled treatment session focused on cognitive-linguistic goals. SLP facilitated session by providing education regarding memory compensatory strategies and ways to incorporate strategies at home. Patient also performed his "sermon" with overall Mod I without word-finding deficits noted. Patient transferred back to bed at end of session and left with alarm on and all needs within reach. Continue with current plan of care.      Pain No/Denies Pain   Therapy/Group: Individual Therapy  Nastassja Witkop 07/30/2019, 3:33 PM

## 2019-07-31 ENCOUNTER — Inpatient Hospital Stay (HOSPITAL_COMMUNITY): Payer: PPO | Admitting: Speech Pathology

## 2019-07-31 ENCOUNTER — Inpatient Hospital Stay (HOSPITAL_COMMUNITY): Payer: PPO

## 2019-07-31 ENCOUNTER — Ambulatory Visit (HOSPITAL_COMMUNITY): Payer: PPO

## 2019-07-31 MED ORDER — ALBUTEROL SULFATE (2.5 MG/3ML) 0.083% IN NEBU
3.0000 mL | INHALATION_SOLUTION | RESPIRATORY_TRACT | Status: DC | PRN
  Administered 2019-08-06: 3 mL via RESPIRATORY_TRACT
  Filled 2019-07-31: qty 9

## 2019-07-31 MED ORDER — FLUTICASONE FUROATE-VILANTEROL 200-25 MCG/INH IN AEPB
1.0000 | INHALATION_SPRAY | Freq: Every day | RESPIRATORY_TRACT | Status: DC
  Administered 2019-08-01 – 2019-08-07 (×7): 1 via RESPIRATORY_TRACT
  Filled 2019-07-31: qty 28

## 2019-07-31 MED ORDER — AMITRIPTYLINE HCL 25 MG PO TABS
25.0000 mg | ORAL_TABLET | Freq: Every day | ORAL | Status: DC
  Administered 2019-07-31: 25 mg via ORAL
  Filled 2019-07-31: qty 1

## 2019-07-31 NOTE — Progress Notes (Signed)
Physical Therapy Session Note  Patient Details  Name: [name] MRN: [id number] Date of Birth: 1933/03/11  Today's Date: 07/31/2019 PT Individual Time: 1100-1200 PT Individual Time Calculation (min): 60 min   Short Term Goals: Week 2:  PT Short Term Goal 1 (Week 2): Patient will perform bed mobility with supervision. PT Short Term Goal 2 (Week 2): Patient will ambulate >125 ft consistently CGA without AD. PT Short Term Goal 3 (Week 2): Patient will tolerate dynamic standing balance >5 min with min A. PT Short Term Goal 4 (Week 2): Patient will perfrom 8-6" steps using R rail with CGA.  Skilled Therapeutic Interventions/Progress Updates:    Functional gait in and out of bathroom with QC x 10-15' with min assist for navigation of uneven entry and CGA for balance during toileting needs including clothing management and hygiene. Pt able to bend down to get pants with CGA for balance. Co-treatment with TR to address community re-entry and education on d/c planning and energy conservation. Pt performs gait over uneven terrain, up/down inclines/ramped surface, and curb step negotiation with and without QC. Pt noted to have very narrow and short step length more often when using QC vs without QC and verbal cues pt able to demonstrate improved quality of gait, increased step length, and wider BOS. Pt able to gait several bouts of about 75-150' with encouraged rest breaks needed and pt with reports of mild orthostasis at times but resolves with breaks. Discussed importance of need to listen to body to self regulate rest breaks and endurance/activity tolerance. Pt does demonstrate decreased memory throughout session and carry over of information. Min assist for curb step negotiation with QC in community setting with cues for sequencing and attention to foot placement.   Stair Journalist, newspaper for home entry with R sided rail  12 steps total with rest break between sets due to fatigue. Cues for  attention to foot placement and slowing speed for increased safety to decrease fall risk. Gait without AD to challenge balance and improve gait quality x 100-150' with focus on gait pattern, safety with mobility, and balance during turning and in dual task environment.   Therapy Documentation Precautions:  Precautions Precautions: Fall Precaution Booklet Issued: No Precaution Comments: pt requires verbal cues for WB precautions during session Required Braces or Orthoses: Splint/Cast, Sling Cervical Brace: Hard collar (for comfort, pt does not wear often per pt report) Splint/Cast: thumb spica splint, Hard collar changed to 'for comfort.' Pt has been opting for no hard collar. Other Brace: thumb spica splint L hand  Restrictions Weight Bearing Restrictions: Yes LUE Weight Bearing: Non weight bearing Other Position/Activity Restrictions: L rib fxs 1-7   Pain:  Pain in ribs (not rated) - rest breaks as needed but declines pain medication at this time.    Therapy/Group: Individual Therapy and Co-Treatment with TR ([id number])  Juanna Cao, PT, DPT, CBIS  07/31/2019, 12:12 PM

## 2019-07-31 NOTE — Progress Notes (Signed)
Recreational Therapy Session Note  Patient Details  Name: TITAN KARNER MRN: [id number] Date of Birth: 17-Dec-1933 Today's Date: 07/31/2019 Time:  0298-4730 Pain: c/o rib pain, unrated, rest provided relief Skilled Therapeutic Interventions/Progress Updates: Session focused on activity tolerance, community mobility and safety awareness during co-treat with PT.  Pt ambulated on outdoor uneven surfaces including inclines, declines, curbs, concrete and brick with contact guard assist using QC and then without QC.  Pt took small shuffled steps when using QC, but took longer steps without QC.   Pt ambulated ~100' x4 before seated rest breaks were needed.  Pt needed min cues for safety awareness- knowing when to take rest breaks, paying attention to his body's signals.    Therapy/Group: Co-Treatment  Terrence Wishon 07/31/2019, 12:23 PM

## 2019-07-31 NOTE — Progress Notes (Signed)
Inpatient Rehabilitation Care Coordinator Assessment and Plan  Patient Details  Name: [name] MRN: [id number] Date of Birth: 18-Mar-1933  Today's Date: 07/31/2019  Problem List:  Patient Active Problem List   Diagnosis Date Noted  . TBI (traumatic brain injury) ([employer]) 07/23/2019  . Traumatic closed fracture of distal clavicle with minimal displacement, left, initial encounter 07/19/2019  . Traumatic brain injury with loss of consciousness ([place])   . Multiple trauma   . Benign prostatic hyperplasia   . Essential hypertension   . AKI (acute kidney injury) ([place])   . Stage 3b chronic kidney disease   . Prediabetes   . Acute blood loss anemia   . Thrombocytopenia ([place])   . Fall 07/14/2019  . Prediabetes   . Eosinophilia   . Need for prophylactic vaccination and inoculation against influenza 11/12/2013  . Nocturnal leg cramps 11/23/2012  . Pain in joint, shoulder region 11/23/2012  . Fall 11/04/2012  . Hematoma 11/04/2012  . Rib pain 11/04/2012  . Acute upper respiratory infections of unspecified site 11/04/2012  . HYPERLIPIDEMIA TYPE IIB / III 03/08/2008  . HYPERTENSION, BENIGN 03/08/2008  . CAD, NATIVE VESSEL 03/08/2008   Past Medical History:  Past Medical History:  Diagnosis Date  . Allergy   . BPH (benign prostatic hyperplasia)   . BPH (benign prostatic hypertrophy)   . CAD (coronary artery disease)    s/p cypher DES to pLAD 6/08; normal LVF;  ETT-Myoview 2009: no ischemia   . Coronary artery disease   . Eosinophilia   . High cholesterol   . HTN (hypertension)   . Hyperlipidemia   . Hypertension   . MI (myocardial infarction) ([place])   . Myocardial infarction ([place])   . Prediabetes   . Trigeminal neuralgia    Past Surgical History:  Past Surgical History:  Procedure Laterality Date  . APPENDECTOMY    . CARDIAC CATHETERIZATION  07/30/2006   CORONARY ANGIOPLASTY WITH STENT PLACEMENT  . CARDIAC CATHETERIZATION    . EXPLORATORY LAPAROTOMY     age 73  .  EXPLORATORY LAPAROTOMY     Social History:  reports that he has never smoked. He has never used smokeless tobacco. He reports that he does not drink alcohol and does not use drugs.  Family / Support Systems Marital Status: Married How Long?: 6 years Patient Roles: Spouse, Parent Spouse/Significant Other: [name] (wife): [phone] Children: 2 adult children Other Supports: Children Anticipated Caregiver: Wife Ability/Limitations of Caregiver: None reported Caregiver Availability: 24/7 Family Dynamics: Pt lives with wife  Social History Preferred language: English Religion: Unknown Cultural Background: Pt served in [employer] and came to [place] due to fellowship for school; Pt worked as a Science writer; currently volunteers at [employer] Education: PHD in history Read: Yes Write: Yes Employment Status: Retired Date Retired/Disabled/Unemployed: 2013 Age Retired: 78 Public relations account executive Issues: Denies Guardian/Conservator: N/A   Abuse/Neglect Abuse/Neglect Assessment Can Be Completed: Yes Physical Abuse: Denies Verbal Abuse: Denies Sexual Abuse: Denies Exploitation of patient/patient's resources: Denies Self-Neglect: Denies  Emotional Status Pt's affect, behavior and adjustment status: Pt in good spirits Recent Psychosocial Issues: Denies Psychiatric History: Denies Substance Abuse History: Denies  Patient / Family Perceptions, Expectations & Goals Pt/Family understanding of illness & functional limitations: Pt wife has general understanding of care needs Premorbid pt/family roles/activities: Independent PTA Anticipated changes in roles/activities/participation: Assistance with ADLs/IADLs Pt/family expectations/goals: "workin on getting coordination back."  [employer]: None Premorbid Home Care/DME Agencies: None Transportation available at discharge: wife Resource referrals recommended: Neuropsychology  Discharge  Planning Living Arrangements: Spouse/significant other Support Systems: Spouse/significant other Type of Residence: Private residence Insurance Resources: Multimedia programmer (specify) ([employer]) Financial Resources: Family Support, Employment Financial Screen Referred: No Living Expenses: Own Money Management: Spouse, Patient Does the patient have any problems obtaining your medications?: No Care Coordinator Barriers to Discharge: Decreased caregiver support, Lack of/limited family support Care Coordinator Anticipated Follow Up Needs: HH/OP  Clinical Impression SW met with pt and pt wife in room to introduce self, explain role and discuss discharge process. No HCPOA. 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 No DME.   Sentoria Brent A Rio Taber 07/31/2019, 9:45 AM

## 2019-07-31 NOTE — Progress Notes (Signed)
Physical Therapy Session Note  Patient Details  Name: [name] MRN: [id number] Date of Birth: 11/26/1933  Today's Date: 07/31/2019 PT Individual Time: 1700-1726 PT Individual Time Calculation (min): 26 min   Short Term Goals: Week 2:  PT Short Term Goal 1 (Week 2): Patient will perform bed mobility with supervision. PT Short Term Goal 2 (Week 2): Patient will ambulate >125 ft consistently CGA without AD. PT Short Term Goal 3 (Week 2): Patient will tolerate dynamic standing balance >5 min with min A. PT Short Term Goal 4 (Week 2): Patient will perfrom 8-6" steps using R rail with CGA.  Skilled Therapeutic Interventions/Progress Updates:     Pt received seated in WC and agreeable to therapy. Reports slight pain in L ribs. Number not provided. PT provides rest breaks as needed to manage pain symptoms.  WC transport to gym for time management. PT educates on stair training technique. Pt performs x4 steps with RHR and CGA with verbal cues on sequencing.  Pt ambulates 150' x2 with seated rest break. Pt ambulates primarily with CGA and verbal cues to maintain upright gaze and increase heel strike at initial contact for improved step height and stride length. Pt does require minA for slight stumble when catching LLE during swing phase.    Pt performs ambulation through obstacle course, weaving in and out of cones and stepping over hockey stick. Pt makes contact with several cones and consistently steps on hockey stick, not able to consistently clear it. Pt requires minA at hips for stability and verbal cues to increase stride length and time sequencing of steps for improved safety.  WC transport back to room. Pt left seated in WC with all needs within reach. Wife present.   Therapy Documentation Precautions:  Precautions Precautions: Fall Precaution Booklet Issued: No Precaution Comments: pt requires verbal cues for WB precautions during session Required Braces or Orthoses:  Splint/Cast, Sling Cervical Brace: Hard collar (for comfort, pt does not wear often per pt report) Splint/Cast: thumb spica splint, Hard collar changed to 'for comfort.' Pt has been opting for no hard collar. Other Brace: thumb spica splint L hand  Restrictions Weight Bearing Restrictions: Yes LUE Weight Bearing: Non weight bearing Other Position/Activity Restrictions: L rib fxs 1-7    Therapy/Group: Individual Therapy  Breck Coons, PT, DPT 07/31/2019, 5:30 PM

## 2019-07-31 NOTE — Progress Notes (Signed)
Occupational Therapy Session Note  Patient Details  Name: [name] MRN: [id number] Date of Birth: [birth year]  Today's Date: 07/31/2019 OT Individual Time: 0211-1735 OT Individual Time Calculation (min): 60 min    Short Term Goals: Week 1:  OT Short Term Goal 1 (Week 1): Pt will wash LB with LRAD with minA. OT Short Term Goal 1 - Progress (Week 1): Met OT Short Term Goal 2 (Week 1): Pt will don socks and shoes with LRAD with modA. OT Short Term Goal 2 - Progress (Week 1): Met OT Short Term Goal 3 (Week 1): Pt will complete toilet transfer with modA. OT Short Term Goal 3 - Progress (Week 1): Met  Skilled Therapeutic Interventions/Progress Updates:    1:1. Pt received in bed agreeable to OT with wife present. OT demo reacher, sock aide and shoe funnel use for wife who is delighted about AE. Pt wife to bring LHSS from home for pt today. Pt completes ambulatory into/out of shower with VC for cane use. Pt completes bathing sit to stand with CGA for standing balane and A to wash B feet. Pt able to follow demo for compensatory strategies to was R armpit/arm. Pt completes dressing at sink with no VC for shirt and min VC for reacher/sock aide use to thread LB clothing/don socks. Pt advances pasthips in standing with CGA. Exited session with pt seated in bed after total A donning of shoes d/t time constraints, exit alarm ona nd call lighti nreach  Therapy Documentation Precautions:  Precautions Precautions: Fall Precaution Booklet Issued: No Precaution Comments: pt requires verbal cues for WB precautions during session Required Braces or Orthoses: Splint/Cast, Sling Cervical Brace: Hard collar (for comfort, pt does not wear often per pt report) Splint/Cast: thumb spica splint, Hard collar changed to 'for comfort.' Pt has been opting for no hard collar. Other Brace: thumb spica splint L hand  Restrictions Weight Bearing Restrictions: Yes LUE Weight Bearing: Non weight bearing Other  Position/Activity Restrictions: L rib fxs 1-7 General:   Vital Signs:  Pain:   ADL: ADL Grooming: Minimal assistance Where Assessed-Grooming: Sitting at sink, Wheelchair Upper Body Bathing: Moderate assistance Where Assessed-Upper Body Bathing: Sitting at sink, Wheelchair Lower Body Bathing: Maximal assistance Where Assessed-Lower Body Bathing: Standing at sink, Sitting at sink Upper Body Dressing: Maximal assistance Where Assessed-Upper Body Dressing: Sitting at sink Lower Body Dressing: Maximal assistance Where Assessed-Lower Body Dressing: Sitting at sink, Standing at sink Vision   Perception    Praxis   Exercises:   Other Treatments:     Therapy/Group: Individual Therapy  [name] 07/31/2019, 10:48 AM

## 2019-07-31 NOTE — Progress Notes (Signed)
Speech Language Pathology Weekly Progress and Session Note  Patient Details  Name: [name] MRN: [id number] Date of Birth: May 10, 1933  Beginning of progress report period: July 24, 2019 End of progress report period: July 31, 2019  Today's Date: 07/31/2019 SLP Individual Time: 0725-0820 SLP Individual Time Calculation (min): 55 min  Short Term Goals: Week 1: SLP Short Term Goal 1 (Week 1): Patient will demonstrate functional problem solving for complex tasks with supervision level verbal cues. SLP Short Term Goal 1 - Progress (Week 1): Met SLP Short Term Goal 2 (Week 1): Patient will recall new, daily information with supervision level verbal cues for use of compensatory strategies. SLP Short Term Goal 2 - Progress (Week 1): Met SLP Short Term Goal 3 (Week 1): Patient will utilize word-finding strategies at the conversation level with supervision level verbal cues. SLP Short Term Goal 3 - Progress (Week 1): Met    New Short Term Goals: Week 2: SLP Short Term Goal 1 (Week 2): STGs=LTGs due to ELOS  Weekly Progress Updates: Patient has made excellent gains and has met 3 of 3 LTGs this reporting period. Currently, patient demonstrates behaviors consistent with a [place]o Level VIII and requires overall supervision verbal cues to complete functional and mildly task safely in regards to problem solving and recall. Patient also demonstrates improved word-finding at the conversation level and is overall Mod I. Patient and family education ongoing. Patient would benefit from continued skilled SLP intervention to maximize his cognitive functioning prior to discharge.     Intensity: Minumum of 1-2 x/day, 30 to 90 minutes Frequency: 3 to 5 out of 7 days Duration/Length of Stay: 08/11/19 Treatment/Interventions: Cognitive remediation/compensation;Internal/external aids;Speech/Language facilitation;Therapeutic Activities;Environmental controls;Cueing hierarchy;Functional tasks;Patient/family  education   Daily Session  Skilled Therapeutic Interventions: Skilled treatment session focused on cognitive goals. Upon arrival, patient requested to use the bathroom. Patient ambulated to the bathroom and was continent of bowel and bladder. Patient also performed basic self-care tasks at the sink with supervision verbal cues to locate items. SLP facilitated session by providing overall Mod A verbal cues for organization and error awareness during a complex money management task. However, patient verbalized appropriate anticipatory awareness in regards to having wife present to assist at home. Patient left upright in wheelchair with alarm on and all needs within reach. Continue with current plan of care.      Pain No/Denies Pain   Therapy/Group: Individual Therapy  Philo Kurtz 07/31/2019, 6:29 AM

## 2019-07-31 NOTE — Progress Notes (Signed)
[place] PHYSICAL MEDICINE & REHABILITATION PROGRESS NOTE   Subjective/Complaints: Pain was much better controlled in therapy yesterday and this morning. He has required some nebulizer treatments at night which is unusual for him. He was on Advair daily at home and had prn albuterol inhaler which he does not have currently ordered. Has been sleeping poorly at night again and would like to try a higher dose of Amitriptyline.   ROS: Patient denies CP, SOB, N/V/D  Objective:   No results found. No results for input(s): WBC, HGB, HCT, PLT in the last 72 hours. No results for input(s): NA, K, CL, CO2, GLUCOSE, BUN, CREATININE, CALCIUM in the last 72 hours.  Intake/Output Summary (Last 24 hours) at 07/31/2019 1040 Last data filed at 07/31/2019 0800 Gross per 24 hour  Intake 740 ml  Output 950 ml  Net -210 ml     Physical Exam: Vital Signs Blood pressure (!) 148/67, pulse 83, temperature 98.1 F (36.7 C), temperature source Oral, resp. rate 18, height 5\' 8"  (1.727 m), weight 95.3 kg, SpO2 97 %. General: Alert and oriented x 3, No apparent distress HEENT: Head is normocephalic, atraumatic, PERRLA, EOMI, sclera anicteric, oral mucosa pink and moist, dentition intact, ext ear canals clear,  Neck: Supple without JVD or lymphadenopathy Heart: Reg rate and rhythm. No murmurs rubs or gallops Chest: CTA bilaterally without wheezes, rales, or rhonchi; no distress Abdomen: Soft, non-tender, non-distended, bowel sounds positive. Ext: no clubbing, cyanosis, 1+ L>R LE edema Psych: pleasant and cooperative, positive mood.  Musculoskeletal:     LUE tender with any movement. Wearing sling, wrist splint in place.   Neurological: alert and oriented to person, place, day of week,mo,year, reason he's here. Normal language. Recalls his street address. Answers biographical questions. Recalled current events in news. RUE 4/5 prox to distal. LUE limited by sling. RLE 3-/5 HF, KE to 4/5 ADF/PF. LLE 2/5 HF,  3/5 KE and 4/5 ADF/PF. Normal sensation  Assessment/Plan: 1. Functional deficits secondary to TBI with polytrauma which require 3+ hours per day of interdisciplinary therapy in a comprehensive inpatient rehab setting.  Physiatrist is providing close team supervision and 24 hour management of active medical problems listed below.  Physiatrist and rehab team continue to assess barriers to discharge/monitor patient progress toward functional and medical goals  Care Tool:  Bathing    Body parts bathed by patient: Left arm, Chest, Abdomen, Front perineal area, Right upper leg, Left upper leg, Face, Buttocks   Body parts bathed by helper: Right arm, Right lower leg, Left lower leg     Bathing assist Assist Level: Minimal Assistance - Patient > 75%     Upper Body Dressing/Undressing Upper body dressing   What is the patient wearing?: Pull over shirt    Upper body assist Assist Level: Minimal Assistance - Patient > 75%    Lower Body Dressing/Undressing Lower body dressing      What is the patient wearing?: Pants, Underwear/pull up     Lower body assist Assist for lower body dressing: Moderate Assistance - Patient 50 - 74%     Toileting Toileting    Toileting assist Assist for toileting: Moderate Assistance - Patient 50 - 74%     Transfers Chair/bed transfer  Transfers assist     Chair/bed transfer assist level: Contact Guard/Touching assist Chair/bed transfer assistive device: Cane   Locomotion Ambulation   Ambulation assist   Ambulation activity did not occur: Safety/medical concerns  Assist level: Minimal Assistance - Patient > 75% Assistive device:  No Device Max distance: 80 ft   Walk 10 feet activity   Assist  Walk 10 feet activity did not occur: Safety/medical concerns  Assist level: Minimal Assistance - Patient > 75% Assistive device: No Device   Walk 50 feet activity   Assist Walk 50 feet with 2 turns activity did not occur: Safety/medical  concerns  Assist level: Minimal Assistance - Patient > 75% Assistive device: No Device    Walk 150 feet activity   Assist Walk 150 feet activity did not occur: Safety/medical concerns         Walk 10 feet on uneven surface  activity   Assist Walk 10 feet on uneven surfaces activity did not occur: Safety/medical concerns         Wheelchair     Assist Will patient use wheelchair at discharge?: Yes (Per PT long term goals) Type of Wheelchair: Manual    Wheelchair assist level: Supervision/Verbal cueing Max wheelchair distance: 150 ft    Wheelchair 50 feet with 2 turns activity    Assist        Assist Level: Supervision/Verbal cueing   Wheelchair 150 feet activity     Assist      Assist Level: Supervision/Verbal cueing   Blood pressure (!) 148/67, pulse 83, temperature 98.1 F (36.7 C), temperature source Oral, resp. rate 18, height 5\' 8"  (1.727 m), weight 95.3 kg, SpO2 97 %.  Medical Problem List and Plan: 1.  Impaired secondary to TBI             -patient may shower             -ELOS/Goals: S with PT, OT, and SLP  --Continue CIR therapies today including PT, OT, and SLP  2.  Antithrombotics: -DVT/anticoagulation:  Pharmaceutical: Lovenox. Not ambulating.             -antiplatelet therapy:  3. Pain Management:   tylenol scheduled at 650 mg qid.    -Ultram prn for moderate to severe pain (was taking at home)- providing better pain relief.    -Thoracic and Lumbar XR ordered 6/22. Continue heating pads.   6/23: Pain currently well controlled but patient says that it did bother him at night. Tramadol helps but does make him woozy. Reviewed results of XRs with patient: there is degeneration throughout thoracic and lumbar spine. There is facet hypertrophy in the lumbar spine-- advised patient to avoid hyperextension and that he may benefit from outpatient medial branch blocks.   6/25: Pain better controlled with Tylenol. Discussed results of imaging  with wife as well.    -limit robaxin as it may have been contributing to night time confusion and was using frequently at night. Change dosing to q6 PRN and reduce to 500mg  4. Mood: LCSW to follow for evaluation and support.              -antipsychotic agents: N/a  -wife appears very supportive 5. Neuropsych: This patient is capable of making decisions on his own behalf.  -cognition demonstrating substantial improvement  6/18-add melatonin at HS to help with sleep   -check sleep chart  6/20: Added Amitriptyline 10mg  HS. Advised that he may feel very groggy in the morning. If too much so, will stop medication.   6/25: sleep has worsened over last few days. Patient would like to try higher dose of Amitriptyline. Will increase to 25mg  HS.  6. Skin/Wound Care: Routine pressure relief measures.  7. Fluids/Electrolytes/Nutrition: encourage PO. 8. HTN: Monitor BP tid--continue cardura. Lotrel  on hold.   6/20: Bp labile. Monitor.   6/22: labile. Orthostatic during therapy. Please use TEDs and abdominal binder during therapy  6/23: Pharmacy asked whether amlodipine should be restarted. Discussed that we should continue to hold for now as patient is normotensive  6/24: well controlled. Less orthostatic in therapy with TEDs and abdominal binder.  9. Left thumb dislocation: s/p relocation. Continue spica splint 10. Distal clavicle fracture- continue sling 11. New onset diabetes: Hgb A1c-6.5--Carb modified diet. Will monitor BS ac/hs for now.  12. Acute urinary retention: cardura and urecholine  6/18 -pvr 88 today, continent. Continue to monitor 13.  ABLA:H/H on downward trend       -hgb: 9.1>8.4> today: 6.4 initially and 7.3 on repeat  -may be dilutional component  -check stool for OB  -pt denies dizziness, fatigue, sob, HR controlled    -Hgb 7.4 on repeat CBC hgb 7.5- stable await stool OB, had first BM today , flushed  14. Acute renal failure on chronic renal failure: SCr 1.97 at admisison:     -BUN/Cr down to 22/1.39 today  -continue to encourage adequate PO  15. Constipation: Patient reporting hard balls yesterday. Stool now loose 16. HLD: 6/23: Pharmacy discussed with me that patient was on Zetia and Lipitor at home and asked if we should restart this. Past lipid panel reviewed and patient had normal LDL- was performed 2 years ago. Discussed with pharmacy that I would order lipid panel for patient today to assess need for medication.   6/25: Lipid panel shows mildly elevated LDL to 115. Otherwise normal. I will not restart lipid medications at this time. Can continue to provide dietary counseling as outpatient and repeat lipid panel as outpatient.     LOS: 8 days A FACE TO FACE EVALUATION WAS PERFORMED  Martha Clan P Derelle Cockrell 07/31/2019, 10:40 AM

## 2019-07-31 NOTE — Plan of Care (Signed)
  Problem: Consults Goal: RH BRAIN INJURY PATIENT EDUCATION Description: Description: See Patient Education module for eduction specifics Outcome: Progressing Goal: Skin Care Protocol Initiated - if Braden Score 18 or less Description: If consults are not indicated, leave blank or document N/A Outcome: Progressing   Problem: RH BOWEL ELIMINATION Goal: RH STG MANAGE BOWEL WITH ASSISTANCE Description: STG Manage Bowel with min Assistance. Outcome: Progressing Goal: RH STG MANAGE BOWEL W/MEDICATION W/ASSISTANCE Description: STG Manage Bowel with Medication with mod Assistance. Outcome: Progressing   Problem: RH BLADDER ELIMINATION Goal: RH STG MANAGE BLADDER WITH EQUIPMENT WITH ASSISTANCE Description: STG Manage Bladder With Equipment With mod Assistance Outcome: Progressing   Problem: RH SKIN INTEGRITY Goal: RH STG SKIN FREE OF INFECTION/BREAKDOWN Description: Skin will be free of infection/breakdown with min assist Outcome: Progressing Goal: RH STG MAINTAIN SKIN INTEGRITY WITH ASSISTANCE Description: STG Maintain Skin Integrity With min Assistance. Outcome: Progressing   Problem: RH SAFETY Goal: RH STG ADHERE TO SAFETY PRECAUTIONS W/ASSISTANCE/DEVICE Description: STG Adhere to Safety Precautions With min Assistance/Device. Outcome: Progressing   Problem: RH COGNITION-NURSING Goal: RH STG USES MEMORY AIDS/STRATEGIES W/ASSIST TO PROBLEM SOLVE Description: STG Uses Memory Aids/Strategies With min Assistance to Problem Solve. Outcome: Progressing   Problem: RH PAIN MANAGEMENT Goal: RH STG PAIN MANAGED AT OR BELOW PT'S PAIN GOAL Description: Pain will be reported less than 4 out of 10 with min assit Outcome: Progressing

## 2019-08-01 ENCOUNTER — Inpatient Hospital Stay (HOSPITAL_COMMUNITY): Payer: PPO

## 2019-08-01 DIAGNOSIS — S069X6D Unspecified intracranial injury with loss of consciousness greater than 24 hours without return to pre-existing conscious level with patient surviving, subsequent encounter: Secondary | ICD-10-CM

## 2019-08-01 MED ORDER — AMITRIPTYLINE HCL 25 MG PO TABS
25.0000 mg | ORAL_TABLET | Freq: Every day | ORAL | Status: DC
  Administered 2019-08-01: 25 mg via ORAL
  Filled 2019-08-01: qty 1

## 2019-08-01 NOTE — Progress Notes (Signed)
Occupational Therapy Session Note  Patient Details  Name: [name] MRN: [id number] Date of Birth: 11/23/1933  Today's Date: 08/01/2019 OT Individual Time: 7121-9758 OT Individual Time Calculation (min): 60 min    Short Term Goals: Week 2:  OT Short Term Goal 1 (Week 2): Pt will groom in standing with CGA to demo improved standing tolerance OT Short Term Goal 2 (Week 2): Pt will wash 10/10 body parts with AE PRN and CGA OT Short Term Goal 3 (Week 2): Pt will doff/don shirt with S and no more than min VC OT Short Term Goal 4 (Week 2): Pt will don footwear with no more than MIN A and MIN VC for AE technique  Skilled Therapeutic Interventions/Progress Updates:    Pt received supine, agreeable to OT session focused on ADLs. Pt reporting rib pain in L flank 3/10, no request for intervention. Pt completed bed mobility with CGA to EOB, heavy use of bed features. Pt independently recalled proper UE placement during sit > stand, requiring min a for power up. Pt used quad cane to ambulate into bathroom with min A for stabilization. Pt transferred onto toilet to void urine, requiring min A for clothing management. Pt transferred into shower with min HHA. Pt able to complete UB bathing with min A- unable to find LH sponge in room but pt reporting his wife has bought one. Pt able to complete LB bathing in standing with CGA. Min cueing for increasing BOS. Pt sat in w/c to dress following shower. Pt with poor sequencing during dressing, requiring min-mod questioning cues as he attempted to don sock on foot already wearing a sock and putting arm in head hole of shirt. Mod A overall to don shirt. Mod A to don pants with use of reacher. Pt guided through use of sock aid- will continue to trial but unsure if this is best piece of AE for pt with cognitive deficits. Pt completed oral care with set up assist. Pt was left sitting up with all needs met. Chair alarm belt fastened.   Therapy  Documentation Precautions:  Precautions Precautions: Fall Precaution Booklet Issued: No Precaution Comments: pt requires verbal cues for WB precautions during session Required Braces or Orthoses: Splint/Cast, Sling Cervical Brace: Hard collar (for comfort, pt does not wear often per pt report) Splint/Cast: thumb spica splint, Hard collar changed to 'for comfort.' Pt has been opting for no hard collar. Other Brace: thumb spica splint L hand  Restrictions Weight Bearing Restrictions: Yes LUE Weight Bearing: Non weight bearing Other Position/Activity Restrictions: L rib fxs 1-7  Therapy/Group: Individual Therapy  [name] 08/01/2019, 6:36 AM

## 2019-08-01 NOTE — Progress Notes (Signed)
[place] PHYSICAL MEDICINE & REHABILITATION PROGRESS NOTE   Subjective/Complaints:  Pt reports feels dizzy and lack of energy this AM- not like hung over, but just not like himself- BP is good 114/67- slept really well with increase in Elavil- More well rested.     ROS:  Pt denies SOB, abd pain, CP, N/V/C/D, and vision changes    Objective:   No results found. No results for input(s): WBC, HGB, HCT, PLT in the last 72 hours. No results for input(s): NA, K, CL, CO2, GLUCOSE, BUN, CREATININE, CALCIUM in the last 72 hours.  Intake/Output Summary (Last 24 hours) at 08/01/2019 1621 Last data filed at 08/01/2019 1443 Gross per 24 hour  Intake 820 ml  Output 1150 ml  Net -330 ml     Physical Exam: Vital Signs Blood pressure (!) 119/46, pulse 79, temperature 98.2 F (36.8 C), resp. rate 20, height 5\' 8"  (1.727 m), weight 95.3 kg, SpO2 99 %. General: sitting EOB, RN in room as well as wife, appropriate, NAD HEENT: conjugate gaze Heart: RRR- no M.R.G Chest: CTA B/L- no W/R/R- good air movement Abdomen: Soft, NT, ND, (+)BS . Ext: no clubbing, cyanosis, 1+ L>R LE edema Psych: appropriate, less energy than expected Musculoskeletal:     LUE tender with any movement. Wearing sling, wrist splint in place.   Neurological: OX3 . RUE 4/5 prox to distal. LUE limited by sling. RLE 3-/5 HF, KE to 4/5 ADF/PF. LLE 2/5 HF, 3/5 KE and 4/5 ADF/PF. Normal sensation  Assessment/Plan: 1. Functional deficits secondary to TBI with polytrauma which require 3+ hours per day of interdisciplinary therapy in a comprehensive inpatient rehab setting.  Physiatrist is providing close team supervision and 24 hour management of active medical problems listed below.  Physiatrist and rehab team continue to assess barriers to discharge/monitor patient progress toward functional and medical goals  Care Tool:  Bathing    Body parts bathed by patient: Left arm, Chest, Abdomen, Front perineal area, Right upper  leg, Left upper leg, Face, Buttocks   Body parts bathed by helper: Right arm, Right lower leg, Left lower leg     Bathing assist Assist Level: Minimal Assistance - Patient > 75%     Upper Body Dressing/Undressing Upper body dressing   What is the patient wearing?: Pull over shirt    Upper body assist Assist Level: Minimal Assistance - Patient > 75%    Lower Body Dressing/Undressing Lower body dressing      What is the patient wearing?: Pants, Underwear/pull up     Lower body assist Assist for lower body dressing: Moderate Assistance - Patient 50 - 74%     Toileting Toileting    Toileting assist Assist for toileting: Contact Guard/Touching assist     Transfers Chair/bed transfer  Transfers assist     Chair/bed transfer assist level: Contact Guard/Touching assist Chair/bed transfer assistive device: Cane   Locomotion Ambulation   Ambulation assist   Ambulation activity did not occur: Safety/medical concerns  Assist level: Minimal Assistance - Patient > 75% Assistive device: No Device Max distance: 150'   Walk 10 feet activity   Assist  Walk 10 feet activity did not occur: Safety/medical concerns  Assist level: Contact Guard/Touching assist Assistive device: No Device   Walk 50 feet activity   Assist Walk 50 feet with 2 turns activity did not occur: Safety/medical concerns  Assist level: Minimal Assistance - Patient > 75% Assistive device: No Device    Walk 150 feet activity   Assist  Walk 150 feet activity did not occur: Safety/medical concerns  Assist level: Minimal Assistance - Patient > 75% Assistive device: No Device    Walk 10 feet on uneven surface  activity   Assist Walk 10 feet on uneven surfaces activity did not occur: Safety/medical concerns   Assist level: Minimal Assistance - Patient > 75% Assistive device: Cane-quad, Other (comment)   Wheelchair     Assist Will patient use wheelchair at discharge?: Yes (Per PT long  term goals) Type of Wheelchair: Manual    Wheelchair assist level: Supervision/Verbal cueing Max wheelchair distance: 150 ft    Wheelchair 50 feet with 2 turns activity    Assist        Assist Level: Supervision/Verbal cueing   Wheelchair 150 feet activity     Assist      Assist Level: Supervision/Verbal cueing   Blood pressure (!) 119/46, pulse 79, temperature 98.2 F (36.8 C), resp. rate 20, height 5\' 8"  (1.727 m), weight 95.3 kg, SpO2 99 %.  Medical Problem List and Plan: 1.  Impaired secondary to TBI             -patient may shower             -ELOS/Goals: S with PT, OT, and SLP  --Continue CIR therapies today including PT, OT, and SLP  2.  Antithrombotics: -DVT/anticoagulation:  Pharmaceutical: Lovenox. Not ambulating.             -antiplatelet therapy:  3. Pain Management:   tylenol scheduled at 650 mg qid.    -Ultram prn for moderate to severe pain (was taking at home)- providing better pain relief.    -Thoracic and Lumbar XR ordered 6/22. Continue heating pads.   6/23: Pain currently well controlled but patient says that it did bother him at night. Tramadol helps but does make him woozy. Reviewed results of XRs with patient: there is degeneration throughout thoracic and lumbar spine. There is facet hypertrophy in the lumbar spine-- advised patient to avoid hyperextension and that he may benefit from outpatient medial branch blocks.   6/25: Pain better controlled with Tylenol. Discussed results of imaging with wife as well.    6/26- pain controlled- con't regimen  -limit robaxin as it may have been contributing to night time confusion and was using frequently at night. Change dosing to q6 PRN and reduce to 500mg  4. Mood: LCSW to follow for evaluation and support.              -antipsychotic agents: N/a  -wife appears very supportive 5. Neuropsych: This patient is capable of making decisions on his own behalf.  -cognition demonstrating substantial  improvement  6/18-add melatonin at HS to help with sleep   -check sleep chart  6/20: Added Amitriptyline 10mg  HS. Advised that he may feel very groggy in the morning. If too much so, will stop medication.   6/25: sleep has worsened over last few days. Patient would like to try higher dose of Amitriptyline. Will increase to 25mg  HS.   6/26- changed to q7pm instead of bedtime to see if helped lightheadedness this AM/lack of energy 6. Skin/Wound Care: Routine pressure relief measures.  7. Fluids/Electrolytes/Nutrition: encourage PO. 8. HTN: Monitor BP tid--continue cardura. Lotrel on hold.   6/20: Bp labile. Monitor.   6/22: labile. Orthostatic during therapy. Please use TEDs and abdominal binder during therapy  6/23: Pharmacy asked whether amlodipine should be restarted. Discussed that we should continue to hold for now as patient is normotensive  6/24: well controlled. Less orthostatic in therapy with TEDs and abdominal binder.   6/26- BP well  Controlled- not low this AM 9. Left thumb dislocation: s/p relocation. Continue spica splint 10. Distal clavicle fracture- continue sling 11. New onset diabetes: Hgb A1c-6.5--Carb modified diet. Will monitor BS ac/hs for now.  12. Acute urinary retention: cardura and urecholine  6/18 -pvr 88 today, continent. Continue to monitor 13.  ABLA:H/H on downward trend       -hgb: 9.1>8.4> today: 6.4 initially and 7.3 on repeat  -may be dilutional component  -check stool for OB  -pt denies dizziness, fatigue, sob, HR controlled    -Hgb 7.4 on repeat CBC hgb 7.5- stable await stool OB, had first BM today , flushed  14. Acute renal failure on chronic renal failure: SCr 1.97 at admisison:    -BUN/Cr down to 22/1.39 today  -continue to encourage adequate PO  15. Constipation: Patient reporting hard balls yesterday. Stool now loose 16. HLD: 6/23: Pharmacy discussed with me that patient was on Zetia and Lipitor at home and asked if we should restart this. Past  lipid panel reviewed and patient had normal LDL- was performed 2 years ago. Discussed with pharmacy that I would order lipid panel for patient today to assess need for medication.   6/25: Lipid panel shows mildly elevated LDL to 115. Otherwise normal. I will not restart lipid medications at this time. Can continue to provide dietary counseling as outpatient and repeat lipid panel as outpatient.     LOS: 9 days A FACE TO FACE EVALUATION WAS PERFORMED  Cloyce Paterson 08/01/2019, 4:21 PM

## 2019-08-02 ENCOUNTER — Inpatient Hospital Stay (HOSPITAL_COMMUNITY): Payer: PPO

## 2019-08-02 ENCOUNTER — Other Ambulatory Visit: Payer: Self-pay | Admitting: Cardiovascular Disease

## 2019-08-02 MED ORDER — AMITRIPTYLINE HCL 10 MG PO TABS
10.0000 mg | ORAL_TABLET | Freq: Every day | ORAL | Status: DC
  Administered 2019-08-02 – 2019-08-06 (×5): 10 mg via ORAL
  Filled 2019-08-02 (×5): qty 1

## 2019-08-02 NOTE — Progress Notes (Signed)
[facility] PHYSICAL MEDICINE & REHABILITATION PROGRESS NOTE   Subjective/Complaints:  Pt reports slept well- but still feels dizzy- hard to tell if energy better since took tramadol o/n and that made him nauseated- didn't eat breakfast (didn't take with food). So, hard to compare, but currently, needs to have a BM/go to bathroom, but overall, thinks feels better than yesterday.   D/w pt and wife- if he/they want me to change Amitriptyline dose, and reduce, I'm happy to- otherwise, Dr Naaman Plummer is here tomorrow.    ROS:   Pt denies SOB, abd pain, CP, N/V/C/D, and vision changes    Objective:   No results found. No results for input(s): WBC, HGB, HCT, PLT in the last 72 hours. No results for input(s): NA, K, CL, CO2, GLUCOSE, BUN, CREATININE, CALCIUM in the last 72 hours.  Intake/Output Summary (Last 24 hours) at 08/02/2019 1453 Last data filed at 08/02/2019 1244 Gross per 24 hour  Intake 460 ml  Output 250 ml  Net 210 ml     Physical Exam: Vital Signs Blood pressure (!) 122/57, pulse 70, temperature 97.6 F (36.4 C), resp. rate 18, height 5\' 8"  (1.727 m), weight 95.3 kg, SpO2 96 %. General: sitting in manual w/c- waiting to have BM, wife joined [place]; NAD HEENT: conjugate gaze Heart: RRR Chest: CTA B/L- no W/R/R- good air movement Abdomen: Soft, NT, ND, (+)BS hyperactive- about to have BM. Ext: no clubbing, cyanosis, 1+ L>R LE edema Psych: appropriate, less energy than expected Musculoskeletal:     LUE tender with any movement. Wearing sling, wrist splint in place.   Neurological: Ox3 but somewhat vague in answers . RUE 4/5 prox to distal. LUE limited by sling. RLE 3-/5 HF, KE to 4/5 ADF/PF. LLE 2/5 HF, 3/5 KE and 4/5 ADF/PF. Normal sensation  Assessment/Plan: 1. Functional deficits secondary to TBI with polytrauma which require 3+ hours per day of interdisciplinary therapy in a comprehensive inpatient rehab setting.  Physiatrist is providing close team supervision and 24  hour management of active medical problems listed below.  Physiatrist and rehab team continue to assess barriers to discharge/monitor patient progress toward functional and medical goals  Care Tool:  Bathing    Body parts bathed by patient: Left arm, Chest, Abdomen, Front perineal area, Right upper leg, Left upper leg, Face, Buttocks   Body parts bathed by helper: Right arm, Right lower leg, Left lower leg     Bathing assist Assist Level: Minimal Assistance - Patient > 75%     Upper Body Dressing/Undressing Upper body dressing   What is the patient wearing?: Pull over shirt    Upper body assist Assist Level: Minimal Assistance - Patient > 75%    Lower Body Dressing/Undressing Lower body dressing      What is the patient wearing?: Pants, Underwear/pull up     Lower body assist Assist for lower body dressing: Moderate Assistance - Patient 50 - 74%     Toileting Toileting    Toileting assist Assist for toileting: Contact Guard/Touching assist     Transfers Chair/bed transfer  Transfers assist     Chair/bed transfer assist level: Contact Guard/Touching assist Chair/bed transfer assistive device: Cane   Locomotion Ambulation   Ambulation assist   Ambulation activity did not occur: Safety/medical concerns  Assist level: Minimal Assistance - Patient > 75% Assistive device: No Device Max distance: 150'   Walk 10 feet activity   Assist  Walk 10 feet activity did not occur: Safety/medical concerns  Assist level: Contact  Guard/Touching assist Assistive device: No Device   Walk 50 feet activity   Assist Walk 50 feet with 2 turns activity did not occur: Safety/medical concerns  Assist level: Minimal Assistance - Patient > 75% Assistive device: No Device    Walk 150 feet activity   Assist Walk 150 feet activity did not occur: Safety/medical concerns  Assist level: Minimal Assistance - Patient > 75% Assistive device: No Device    Walk 10 feet on  uneven surface  activity   Assist Walk 10 feet on uneven surfaces activity did not occur: Safety/medical concerns   Assist level: Minimal Assistance - Patient > 75% Assistive device: Cane-quad, Other (comment)   Wheelchair     Assist Will patient use wheelchair at discharge?: Yes (Per PT long term goals) Type of Wheelchair: Manual    Wheelchair assist level: Supervision/Verbal cueing Max wheelchair distance: 150 ft    Wheelchair 50 feet with 2 turns activity    Assist        Assist Level: Supervision/Verbal cueing   Wheelchair 150 feet activity     Assist      Assist Level: Supervision/Verbal cueing   Blood pressure (!) 122/57, pulse 70, temperature 97.6 F (36.4 C), resp. rate 18, height 5\' 8"  (1.727 m), weight 95.3 kg, SpO2 96 %.  Medical Problem List and Plan: 1.  Impaired secondary to TBI             -patient may shower             -ELOS/Goals: S with PT, OT, and SLP  --Continue CIR therapies today including PT, OT, and SLP  2.  Antithrombotics: -DVT/anticoagulation:  Pharmaceutical: Lovenox. Not ambulating.             -antiplatelet therapy:  3. Pain Management:   tylenol scheduled at 650 mg qid.    -Ultram prn for moderate to severe pain (was taking at home)- providing better pain relief.    -Thoracic and Lumbar XR ordered 6/22. Continue heating pads.   6/23: Pain currently well controlled but patient says that it did bother him at night. Tramadol helps but does make him woozy. Reviewed results of XRs with patient: there is degeneration throughout thoracic and lumbar spine. There is facet hypertrophy in the lumbar spine-- advised patient to avoid hyperextension and that he may benefit from outpatient medial branch blocks.   6/25: Pain better controlled with Tylenol. Discussed results of imaging with wife as well.    6/26- pain controlled- con't regimen  6/27- took tramadol o/n- made him nauseated- suggested with food- and focus on tylenol use- but  pain was worse  -limit robaxin as it may have been contributing to night time confusion and was using frequently at night. Change dosing to q6 PRN and reduce to 500mg  4. Mood: LCSW to follow for evaluation and support.              -antipsychotic agents: N/a  -wife appears very supportive 5. Neuropsych: This patient is capable of making decisions on his own behalf.  -cognition demonstrating substantial improvement  6/18-add melatonin at HS to help with sleep   -check sleep chart  6/20: Added Amitriptyline 10mg  HS. Advised that he may feel very groggy in the morning. If too much so, will stop medication.   6/25: sleep has worsened over last few days. Patient would like to try higher dose of Amitriptyline. Will increase to 25mg  HS.   6/26- changed to q7pm instead of bedtime to see if  helped lightheadedness this AM/lack of energy  6/27- will let me know sometime today if will change/reduce dose.  6. Skin/Wound Care: Routine pressure relief measures.  7. Fluids/Electrolytes/Nutrition: encourage PO. 8. HTN: Monitor BP tid--continue cardura. Lotrel on hold.   6/20: Bp labile. Monitor.   6/22: labile. Orthostatic during therapy. Please use TEDs and abdominal binder during therapy  6/23: Pharmacy asked whether amlodipine should be restarted. Discussed that we should continue to hold for now as patient is normotensive  6/24: well controlled. Less orthostatic in therapy with TEDs and abdominal binder.   6/26- BP well  Controlled- not low this AM  6/27- BP 122/60s- no orthostasis this AM 9. Left thumb dislocation: s/p relocation. Continue spica splint 10. Distal clavicle fracture- continue sling 11. New onset diabetes: Hgb A1c-6.5--Carb modified diet. Will monitor BS ac/hs for now.  12. Acute urinary retention: cardura and urecholine  6/18 -pvr 88 today, continent. Continue to monitor 13.  ABLA:H/H on downward trend       -hgb: 9.1>8.4> today: 6.4 initially and 7.3 on repeat  -may be dilutional  component  -check stool for OB  -pt denies dizziness, fatigue, sob, HR controlled    -Hgb 7.4 on repeat CBC hgb 7.5- stable await stool OB, had first BM today , flushed  14. Acute renal failure on chronic renal failure: SCr 1.97 at admisison:    -BUN/Cr down to 22/1.39 today  -continue to encourage adequate PO  15. Constipation: Patient reporting hard balls yesterday. Stool now loose 16. HLD: 6/23: Pharmacy discussed with me that patient was on Zetia and Lipitor at home and asked if we should restart this. Past lipid panel reviewed and patient had normal LDL- was performed 2 years ago. Discussed with pharmacy that I would order lipid panel for patient today to assess need for medication.   6/25: Lipid panel shows mildly elevated LDL to 115. Otherwise normal. I will not restart lipid medications at this time. Can continue to provide dietary counseling as outpatient and repeat lipid panel as outpatient.     LOS: 10 days A FACE TO FACE EVALUATION WAS PERFORMED  [name] 08/02/2019, 2:53 PM

## 2019-08-02 NOTE — Progress Notes (Signed)
Occupational Therapy Session Note  Patient Details  Name: [name] MRN: [id number] Date of Birth: 07/24/1933  Today's Date: 08/02/2019 OT Individual Time: 1000-1100 OT Individual Time Calculation (min): 60 min    Short Term Goals: Week 2:  OT Short Term Goal 1 (Week 2): Pt will groom in standing with CGA to demo improved standing tolerance OT Short Term Goal 2 (Week 2): Pt will wash 10/10 body parts with AE PRN and CGA OT Short Term Goal 3 (Week 2): Pt will doff/don shirt with S and no more than min VC OT Short Term Goal 4 (Week 2): Pt will don footwear with no more than MIN A and MIN VC for AE technique  Skilled Therapeutic Interventions/Progress Updates:    Pt received supine with c/o dizziness and ongoing rib pain, not rated. BP obtained and was 125/66 and all other VSS. Pt completed stand pivot transfer to the w/c with min A. Pt and his wife were taken down to the ADL apt for discussion re shower transfers and AE. Demonstration provided re use of recommended TTB. Pt completed transfer himself with mod cueing for technique (pt unable to remember use right after demonstration) and mod A for lifting BLE over side of tub. Pt transferred back to his w/c and returned to the room. Pt completed UB bathing at the sink with min A. Min A to remove shirt and to don new shirt with cueing for technique. Checked in with MD re removal of LUE splint for hygiene. Pt's hand and wrist were gently washed, avoiding thumb. Pt was left sitting up in the w/c with all needs met, chair alarm set.   Therapy Documentation Precautions:  Precautions Precautions: Fall Precaution Booklet Issued: No Precaution Comments: pt requires verbal cues for WB precautions during session Required Braces or Orthoses: Splint/Cast, Sling Cervical Brace: Hard collar (for comfort, pt does not wear often per pt report) Splint/Cast: thumb spica splint, Hard collar changed to 'for comfort.' Pt has been opting for no hard  collar. Other Brace: thumb spica splint L hand  Restrictions Weight Bearing Restrictions: Yes LUE Weight Bearing: Non weight bearing Other Position/Activity Restrictions: L rib fxs 1-7   Therapy/Group: Individual Therapy   H  08/02/2019, 6:47 AM 

## 2019-08-03 ENCOUNTER — Inpatient Hospital Stay (HOSPITAL_COMMUNITY): Payer: PPO

## 2019-08-03 ENCOUNTER — Inpatient Hospital Stay (HOSPITAL_COMMUNITY): Payer: PPO | Admitting: Speech Pathology

## 2019-08-03 DIAGNOSIS — R7989 Other specified abnormal findings of blood chemistry: Secondary | ICD-10-CM

## 2019-08-03 DIAGNOSIS — S42002S Fracture of unspecified part of left clavicle, sequela: Secondary | ICD-10-CM

## 2019-08-03 DIAGNOSIS — S069X2S Unspecified intracranial injury with loss of consciousness of 31 minutes to 59 minutes, sequela: Secondary | ICD-10-CM

## 2019-08-03 LAB — BASIC METABOLIC PANEL
Anion gap: 10 (ref 5–15)
BUN: 26 mg/dL — ABNORMAL HIGH (ref 8–23)
CO2: 25 mmol/L (ref 22–32)
Calcium: 9 mg/dL (ref 8.9–10.3)
Chloride: 103 mmol/L (ref 98–111)
Creatinine, Ser: 1.56 mg/dL — ABNORMAL HIGH (ref 0.61–1.24)
GFR calc Af Amer: 47 mL/min — ABNORMAL LOW (ref >60)
GFR calc non Af Amer: 40 mL/min — ABNORMAL LOW (ref >60)
Glucose, Bld: 97 mg/dL (ref 70–99)
Potassium: 4.6 mmol/L (ref 3.5–5.1)
Sodium: 138 mmol/L (ref 135–145)

## 2019-08-03 LAB — CBC
HCT: 27.6 % — ABNORMAL LOW (ref 39.0–52.0)
Hemoglobin: 8.6 g/dL — ABNORMAL LOW (ref 13.0–17.0)
MCH: 32.5 pg (ref 26.0–34.0)
MCHC: 31.2 g/dL (ref 30.0–36.0)
MCV: 104.2 fL — ABNORMAL HIGH (ref 80.0–100.0)
Platelets: 463 10*3/uL — ABNORMAL HIGH (ref 150–400)
RBC: 2.65 MIL/uL — ABNORMAL LOW (ref 4.22–5.81)
RDW: 17.1 % — ABNORMAL HIGH (ref 11.5–15.5)
WBC: 6.3 10*3/uL (ref 4.0–10.5)
nRBC: 0 % (ref 0.0–0.2)

## 2019-08-03 MED ORDER — BETHANECHOL CHLORIDE 10 MG PO TABS
10.0000 mg | ORAL_TABLET | Freq: Three times a day (TID) | ORAL | Status: DC
  Administered 2019-08-03 – 2019-08-04 (×3): 10 mg via ORAL
  Filled 2019-08-03 (×3): qty 1

## 2019-08-03 NOTE — Progress Notes (Signed)
Occupational Therapy Session Note  Patient Details  Name: [name] MRN: [id number] Date of Birth: 1933-12-20  Today's Date: 08/03/2019 OT Individual Time: 0355-9741 OT Individual Time Calculation (min): 60 min   Session 2: OT Individual Time: 1345-1415 OT Individual Time Calculation (min): 45 min    Short Term Goals: Week 2:  OT Short Term Goal 1 (Week 2): Pt will groom in standing with CGA to demo improved standing tolerance OT Short Term Goal 2 (Week 2): Pt will wash 10/10 body parts with AE PRN and CGA OT Short Term Goal 3 (Week 2): Pt will doff/don shirt with S and no more than min VC OT Short Term Goal 4 (Week 2): Pt will don footwear with no more than MIN A and MIN VC for AE technique  Skilled Therapeutic Interventions/Progress Updates:    Pt received sitting up in w/c with c/o pain last night. Pt currently c/o 3/10 in his ribs. Pt completed sit > stand from w/c with CGA using quad cane. Pt completed ambulatory transfer into the shower with CGA, onto TTB. Pt completed UB bathing with (S), using LH sponge. Pt completed LB bathing with min A for distal LB. Pt transferred back to w/c and donned shirt with (S). Min cueing for selective attention during dressing. Pt used reacher to don underwear and pants with min A, min cueing overall. Min cueing for safety awareness during session. Pt taken down to IADL apt for tub transfer simulation using TTB. Pt returned demo of use of TTB with min cueing, min guard overall- improved BLE lifting over side of tub this session. Pt used SPC and completed 200 ft of functional mobility back to room at [facility] level. Pt left sitting up with all needs met, chair alarm set.   Session 2:  Pt received sitting in w/c with 3/10 rib pain, agreeable to OT session. Sling adjusted and demo given to wife re Discussed family edu with pt and his wife re daughter also coming in. Pt was taken down to the therapy gym via w/c. Pt completed dynamic balance task with focus  on cane management and balance reactions. CGA overall provided. Pt then completed stepping activity with focus on lateral weight shifting and dynamic standing balance. Functional reaching task in standing performed, focus on reaching outside of BOS with min guard provided. Pt returned to his room and left supine with all needs met. Bed alarm set. Discussed family edu with CSW.   Therapy Documentation Precautions:  Precautions Precautions: Fall Precaution Booklet Issued: No Precaution Comments: pt requires verbal cues for WB precautions during session Required Braces or Orthoses: Splint/Cast, Sling Cervical Brace: Hard collar (for comfort, pt does not wear often per pt report) Splint/Cast: thumb spica splint, Hard collar changed to 'for comfort.' Pt has been opting for no hard collar. Other Brace: thumb spica splint L hand  Restrictions Weight Bearing Restrictions: Yes LUE Weight Bearing: Non weight bearing Other Position/Activity Restrictions: L rib fxs 1-7   Therapy/Group: Individual Therapy  [name] 08/03/2019, 6:43 AM

## 2019-08-03 NOTE — Progress Notes (Signed)
Speech Language Pathology Daily Session Note  Patient Details  Name: [name] MRN: [id number] Date of Birth: [birth year]  Today's Date: 08/03/2019 SLP Individual Time: 0930-1025 SLP Individual Time Calculation (min): 55 min  Short Term Goals: Week 2: SLP Short Term Goal 1 (Week 2): STGs=LTGs due to ELOS  Skilled Therapeutic Interventions: Skilled treatment session focused on cognitive-linguistic goals. SLP facilitated session by providing overall extra time for word-finding during an abstract, novel task. However, Supervision-Min A verbal cues were needed for recall for specific category/procedure of task due to impaired working memory. Patient left upright in the wheelchair with alarm on and all needs within reach. Continue with current plan of care.      Pain Pain Assessment Pain Scale: 0-10 Pain Score: 0-No pain  Therapy/Group: Individual Therapy  Abubakar Crispo 08/03/2019, 10:52 AM

## 2019-08-03 NOTE — Progress Notes (Addendum)
[facility] PHYSICAL MEDICINE & REHABILITATION PROGRESS NOTE   Subjective/Complaints:  Still c/o dizziness when up at times, but it doesn't seem all that severe.   ROS: Patient denies fever, rash, sore throat, blurred vision, nausea, vomiting, diarrhea, cough, shortness of breath or chest pain,  headache, or mood change.    Objective:   No results found. Recent Labs    08/03/19 0648  WBC 6.3  HGB 8.6*  HCT 27.6*  PLT 463*   Recent Labs    08/03/19 0648  NA 138  K 4.6  CL 103  CO2 25  GLUCOSE 97  BUN 26*  CREATININE 1.56*  CALCIUM 9.0    Intake/Output Summary (Last 24 hours) at 08/03/2019 1127 Last data filed at 08/03/2019 0240 Gross per 24 hour  Intake 560 ml  Output 200 ml  Net 360 ml     Physical Exam: Vital Signs Blood pressure (!) 154/67, pulse 91, temperature 98.1 F (36.7 C), temperature source Oral, resp. rate 18, height 5\' 8"  (1.727 m), weight 95.3 kg, SpO2 99 %. Constitutional: No distress . Vital signs reviewed. HEENT: EOMI, oral membranes moist Neck: supple Cardiovascular: RRR without murmur. No JVD    Respiratory/Chest: CTA Bilaterally without wheezes or rales. Normal effort    GI/Abdomen: BS +, non-tender, non-distended Ext: no clubbing, cyanosis, or edema Psych: pleasant and cooperative Musculoskeletal:     LUE tender with movement. Wearing sling, wrist splint in place.   Neurological: Ox3 some STM deficits, good insight and awareness . RUE 4/5 prox to distal. LUE limited by sling. RLE 3-/5 HF, KE to 4/5 ADF/PF. LLE 2/5 HF, 3/5 KE and 4/5 ADF/PF. Normal sensation--motor and sensory stable  Assessment/Plan: 1. Functional deficits secondary to TBI with polytrauma which require 3+ hours per day of interdisciplinary therapy in a comprehensive inpatient rehab setting.  Physiatrist is providing close team supervision and 24 hour management of active medical problems listed below.  Physiatrist and rehab team continue to assess barriers to  discharge/monitor patient progress toward functional and medical goals  Care Tool:  Bathing    Body parts bathed by patient: Left arm, Chest, Abdomen, Front perineal area, Right upper leg, Left upper leg, Face, Buttocks, Right arm   Body parts bathed by helper: Right arm, Right lower leg, Left lower leg     Bathing assist Assist Level: Minimal Assistance - Patient > 75%     Upper Body Dressing/Undressing Upper body dressing   What is the patient wearing?: Pull over shirt    Upper body assist Assist Level: Minimal Assistance - Patient > 75%    Lower Body Dressing/Undressing Lower body dressing      What is the patient wearing?: Pants, Underwear/pull up     Lower body assist Assist for lower body dressing: Minimal Assistance - Patient > 75%     Toileting Toileting    Toileting assist Assist for toileting: Contact Guard/Touching assist     Transfers Chair/bed transfer  Transfers assist     Chair/bed transfer assist level: Contact Guard/Touching assist Chair/bed transfer assistive device: Research officer, political party   Ambulation assist   Ambulation activity did not occur: Safety/medical concerns  Assist level: Minimal Assistance - Patient > 75% Assistive device: No Device Max distance: 150'   Walk 10 feet activity   Assist  Walk 10 feet activity did not occur: Safety/medical concerns  Assist level: Contact Guard/Touching assist Assistive device: No Device   Walk 50 feet activity   Assist Walk 50 feet with  2 turns activity did not occur: Safety/medical concerns  Assist level: Minimal Assistance - Patient > 75% Assistive device: No Device    Walk 150 feet activity   Assist Walk 150 feet activity did not occur: Safety/medical concerns  Assist level: Minimal Assistance - Patient > 75% Assistive device: No Device    Walk 10 feet on uneven surface  activity   Assist Walk 10 feet on uneven surfaces activity did not occur: Safety/medical  concerns   Assist level: Minimal Assistance - Patient > 75% Assistive device: Cane-quad, Other (comment)   Wheelchair     Assist Will patient use wheelchair at discharge?: Yes (Per PT long term goals) Type of Wheelchair: Manual    Wheelchair assist level: Supervision/Verbal cueing Max wheelchair distance: 150 ft    Wheelchair 50 feet with 2 turns activity    Assist        Assist Level: Supervision/Verbal cueing   Wheelchair 150 feet activity     Assist      Assist Level: Supervision/Verbal cueing   Blood pressure (!) 154/67, pulse 91, temperature 98.1 F (36.7 C), temperature source Oral, resp. rate 18, height 5\' 8"  (1.727 m), weight 95.3 kg, SpO2 99 %.  Medical Problem List and Plan: 1.  Impaired secondary to TBI             -patient may shower             -ELOS/Goals: S with PT, OT, and SLP--ELOS 7/6  --Continue CIR therapies today including PT, OT, and SLP  2.  Antithrombotics: -DVT/anticoagulation:  Pharmaceutical: Lovenox. Not ambulating.             -antiplatelet therapy:  3. Pain Management:   tylenol scheduled at 650 mg qid.    -Ultram prn for moderate to severe pain (was taking at home)- providing better pain relief.    -Thoracic and Lumbar XR ordered 6/22. Continue heating pads.   6/23: Pain currently well controlled but patient says that it did bother him at night. Tramadol helps but does make him woozy. Reviewed results of XRs with patient: there is degeneration throughout thoracic and lumbar spine. There is facet hypertrophy in the lumbar spine-- advised patient to avoid hyperextension and that he may benefit from outpatient medial branch blocks.   6/25: Pain better controlled with Tylenol. Discussed results of imaging with wife as well.    6/26- pain controlled- con't regimen  6/27-limit tramadol d/t nausea, ?dizziness, utilize tylenol when possible as well as ice and heat.  4. Mood: LCSW to follow for evaluation and support.               -antipsychotic agents: N/a  -wife appears very supportive 5. Neuropsych: This patient is capable of making decisions on his own behalf.  -cognition demonstrating substantial improvement      6/28- appears to have slept better. Was alert and appropriate with me. We can continue the reduced dose of elavil 10mg  at 1900 for now   -continue melatonin 3mg  qhs   -on lexapro daily as home med 6. Skin/Wound Care: Routine pressure relief measures.  7. Fluids/Electrolytes/Nutrition: encourage PO. 8. HTN: Monitor BP tid--continue cardura. Lotrel on hold.   6/20: Bp labile. Monitor.   6/22: labile. Orthostatic during therapy. Please use TEDs and abdominal binder during therapy  6/23: Pharmacy asked whether amlodipine should be restarted. Discussed that we should continue to hold for now as patient is normotensive  6/24: well controlled. Less orthostatic in therapy with TEDs and  abdominal binder.   6/28 bp's more robust. Monitor for further dizziness 9. Left thumb dislocation: s/p relocation. Continue spica splint 10. Distal clavicle fracture- continue sling 11. New onset diabetes: Hgb A1c-6.5--Carb modified diet. Will monitor BS ac/hs for now.  12. Acute urinary retention: cardura and urecholine  6/28 voiding well. Taper off urecholine 13.  ABLA:H/H on downward trend       -hgb up to 8.6 6/28, may be concentration component, no signs of bleeding 14. Acute renal failure on chronic renal failure: SCr 1.97 at admisison:    -BUN/Cr down to 22/1.39 today  -BUN/Cr sl up to 26/1.56 6/28---encourage appropraite fluids  15. Constipation: Patient reporting hard balls yesterday. Stool now loose 16. HLD: 6/23: Pharmacy discussed  that patient was on Zetia and Lipitor at home and asked if we should restart this.    -lipid panel shows mildly elevated LDL to 115. Otherwise normal.   -will not resume while in hospital, diet ed   LOS: 11 days A FACE TO [place] 08/03/2019,  11:27 AM

## 2019-08-03 NOTE — Progress Notes (Signed)
Physical Therapy Session Note  Patient Details  Name: [name] MRN: [id number] Date of Birth: [birth year]  Today's Date: 08/03/2019 PT Individual Time: 1500-1530 PT Individual Time Calculation (min): 30 min   Short Term Goals: Week 2:  PT Short Term Goal 1 (Week 2): Patient will perform bed mobility with supervision. PT Short Term Goal 2 (Week 2): Patient will ambulate >125 ft consistently CGA without AD. PT Short Term Goal 3 (Week 2): Patient will tolerate dynamic standing balance >5 min with min A. PT Short Term Goal 4 (Week 2): Patient will perfrom 8-6" steps using R rail with CGA.  Skilled Therapeutic Interventions/Progress Updates:     Patient sitting EOB, L thumb spica splint and arm sling in place, with his wife in the room upon PT arrival. Patient alert and agreeable to PT session. Patient reported mild rib pain during session. PT provided repositioning, rest breaks, and distraction as pain interventions throughout session. Patient's wife asking if she can assist the patient to/from the bathroom instead of asking for nursing staff to assist. PT assessed patient's wife assisting patient with bed mobility, transfers, and gait to/from the bathroom during session, see details below. Patient's wife demonstrated safe assist with all mobility, cleared her to transfer patient bed<>w/c and to/from the bathroom for toileting only in the room, RN made aware and safety sheet updated. Educated on use of non-skid socks or tennis shoes when ambulating or transferring in the room. Instructed patient's wife to inform nursing staff when she is leaving to have appropriate alarm set when patient is alone in the room for patient safety. Patient and his wife in agreement.   Therapeutic Activity: Bed Mobility: Patient performed supine to/from sit with supervision in a flat bed without use of bed rails. Patient's wife provide cues for safe technique without use of R UE. Transfers: Patient performed sit  to/from stand from the bed, w/c, and toilet with supervision using grab bar or quad cane. Patient's wife provided verbal cues for hand placement on bed or arm rest to push-up and reach back for safety during transfers. Patient simulated LB clothing management and hand washing with supervision during simulated toileting.  Gait Training:  Patient ambulated 15 feet to/form the bathroom using a quad cane with supervision and safe guarding from his wife. Ambulated with decreased step length, gait speed, and step height and mild forward trunk flexion. His wife provided verbal cues for decreased gait speed and increased safety awareness during gait.  Patient's wife had concerns about returning home and managing stairs. PT provided education on patient's current level of function during stair training, CGA 12 steps with 1 rail. Educated on strategies for preventing and assisting falls on stairs and informed her that this will bed demonstrated and hands-on training would be provided during family education. Scheduled family education for Thursday afternoon from 1-3:30, rehab team and scheduling made aware. Discussed d/c planning, home safety, and equipment needs during session.   Patient sitting EOB with is wife in the room at end of session with breaks locked and all needs within reach.    Therapy Documentation Precautions:  Precautions Precautions: Fall Precaution Booklet Issued: No Precaution Comments: pt requires verbal cues for WB precautions during session Required Braces or Orthoses: Splint/Cast, Sling Splint/Cast: thumb spica splint Other Brace: thumb spica splint L hand  Restrictions Weight Bearing Restrictions: Yes LUE Weight Bearing: Non weight bearing Other Position/Activity Restrictions: L rib fxs 1-7    Therapy/Group: Individual Therapy  Oshen Wlodarczyk L Rihan Schueler PT, DPT  08/03/2019, 5:41 PM

## 2019-08-03 NOTE — Plan of Care (Signed)
  Problem: RH SAFETY Goal: RH STG ADHERE TO SAFETY PRECAUTIONS W/ASSISTANCE/DEVICE Description: STG Adhere to Safety Precautions With mod I Assistance/Device. Outcome: Progressing   Problem: RH PAIN MANAGEMENT Goal: RH STG PAIN MANAGED AT OR BELOW PT'S PAIN GOAL Description: Pain will be reported less than 4 out of 10 with min assit Outcome: Progressing

## 2019-08-04 ENCOUNTER — Inpatient Hospital Stay (HOSPITAL_COMMUNITY): Payer: PPO

## 2019-08-04 ENCOUNTER — Inpatient Hospital Stay (HOSPITAL_COMMUNITY): Payer: PPO | Admitting: Speech Pathology

## 2019-08-04 ENCOUNTER — Inpatient Hospital Stay (HOSPITAL_COMMUNITY): Payer: PPO | Admitting: Physical Therapy

## 2019-08-04 IMAGING — DX DG CLAVICLE*L*
2 series · 2 of 2 positions shown · non-contrast
Comparison: Chest x-ray from [DATE]

CLINICAL DATA: History of recent fall with left clavicular pain,
initial encounter

EXAM:
LEFT CLAVICLE - 2+ VIEWS

[clavicle ap]
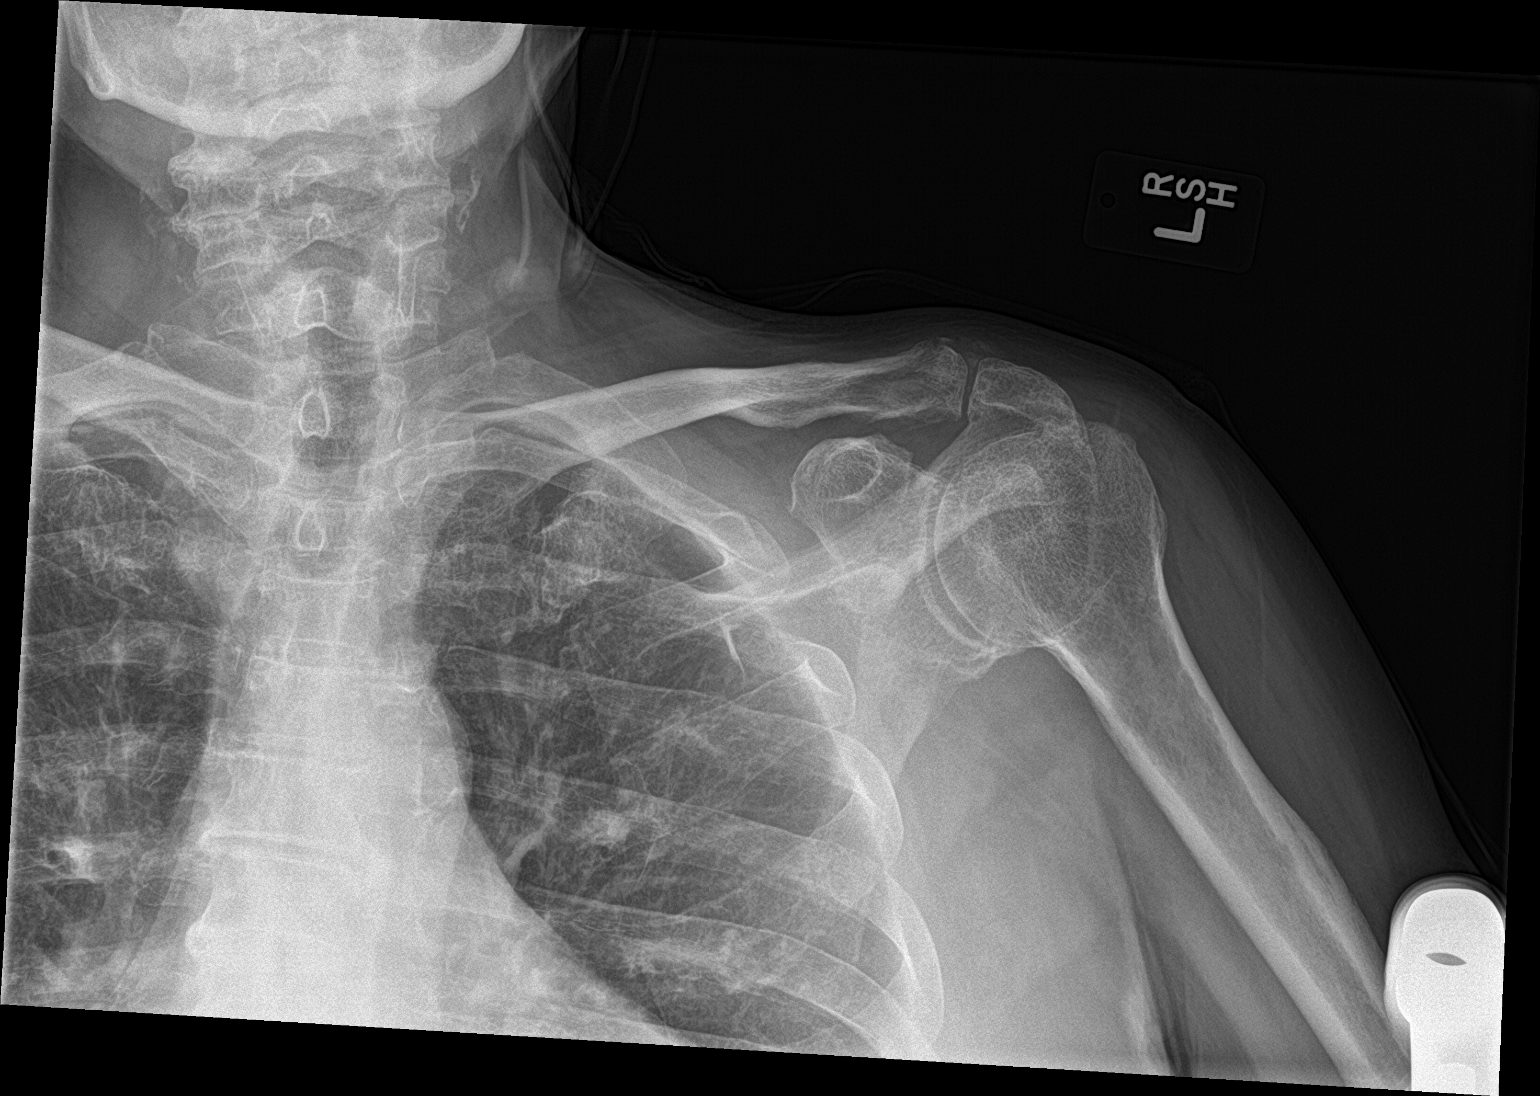

[clavicle axial]
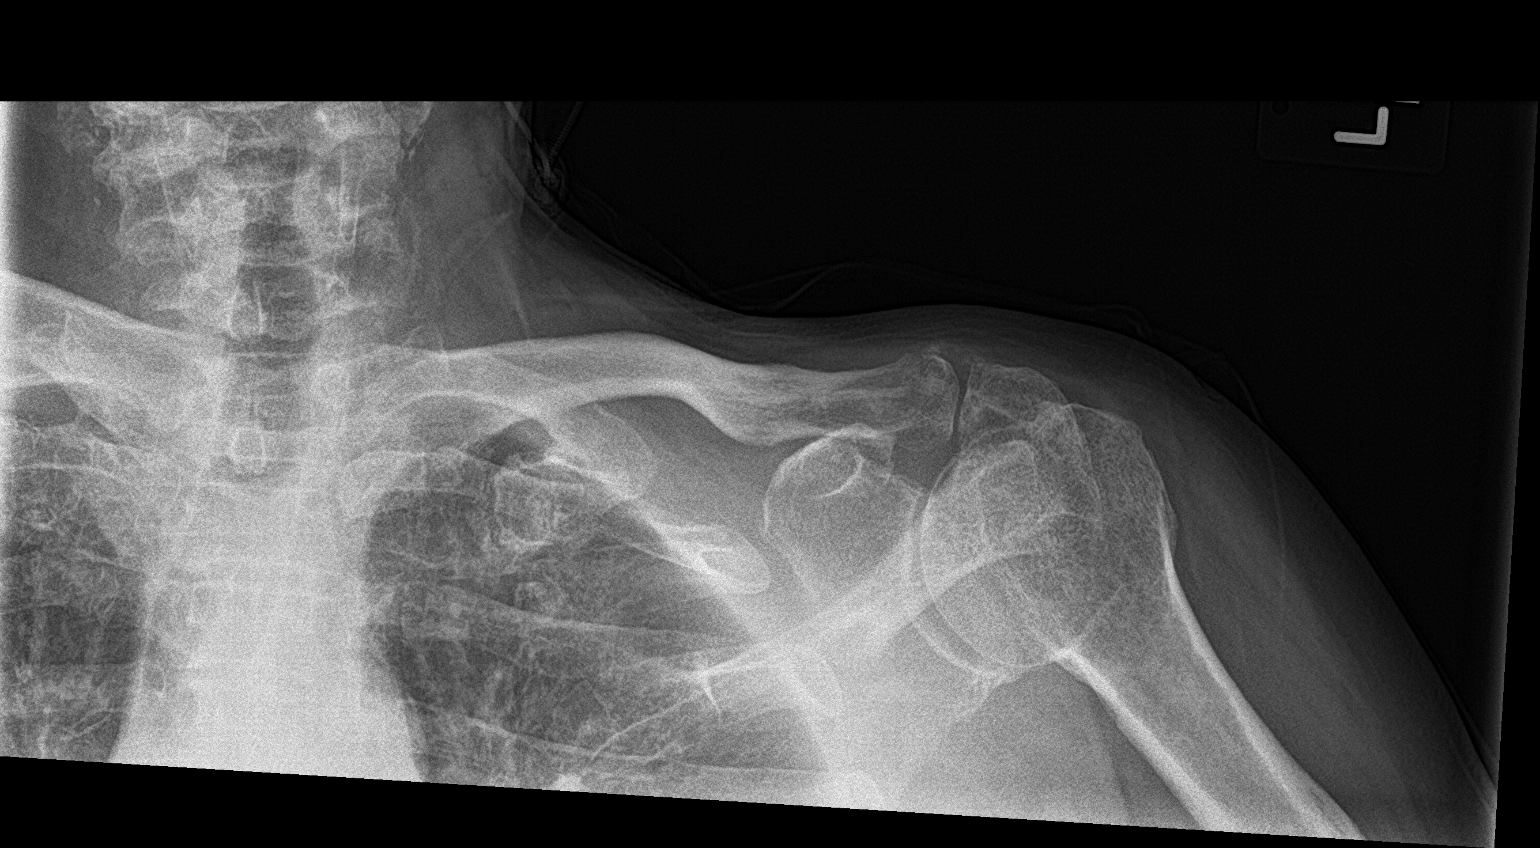

[2 of 2 positions shown; findings below may reference images not displayed]

FINDINGS: Degenerative changes of the acromioclavicular joint are seen. The
known distal left clavicular fracture is not well appreciated on
today's exam due to positioning. No new fracture is seen. The
underlying bony thorax is within limits.
IMPRESSION: Degenerative changes at the acromioclavicular joint. The known
distal left clavicular fracture is not well appreciated on this
study.

## 2019-08-04 IMAGING — DX DG FINGER THUMB 2+V*L*
3 series · 3 of 3 positions shown · non-contrast
Comparison: [DATE]

CLINICAL DATA: Recent fall with thumb pain, initial encounter

EXAM:
LEFT THUMB 2+V

[finger ap]
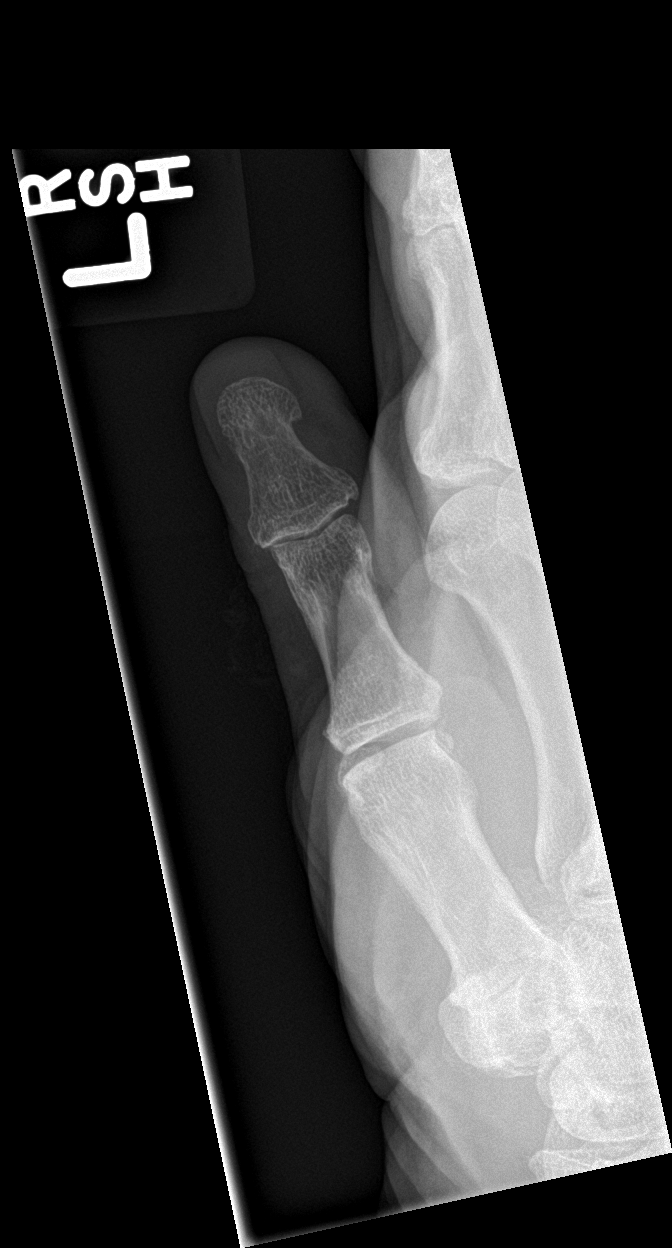

[finger obl]
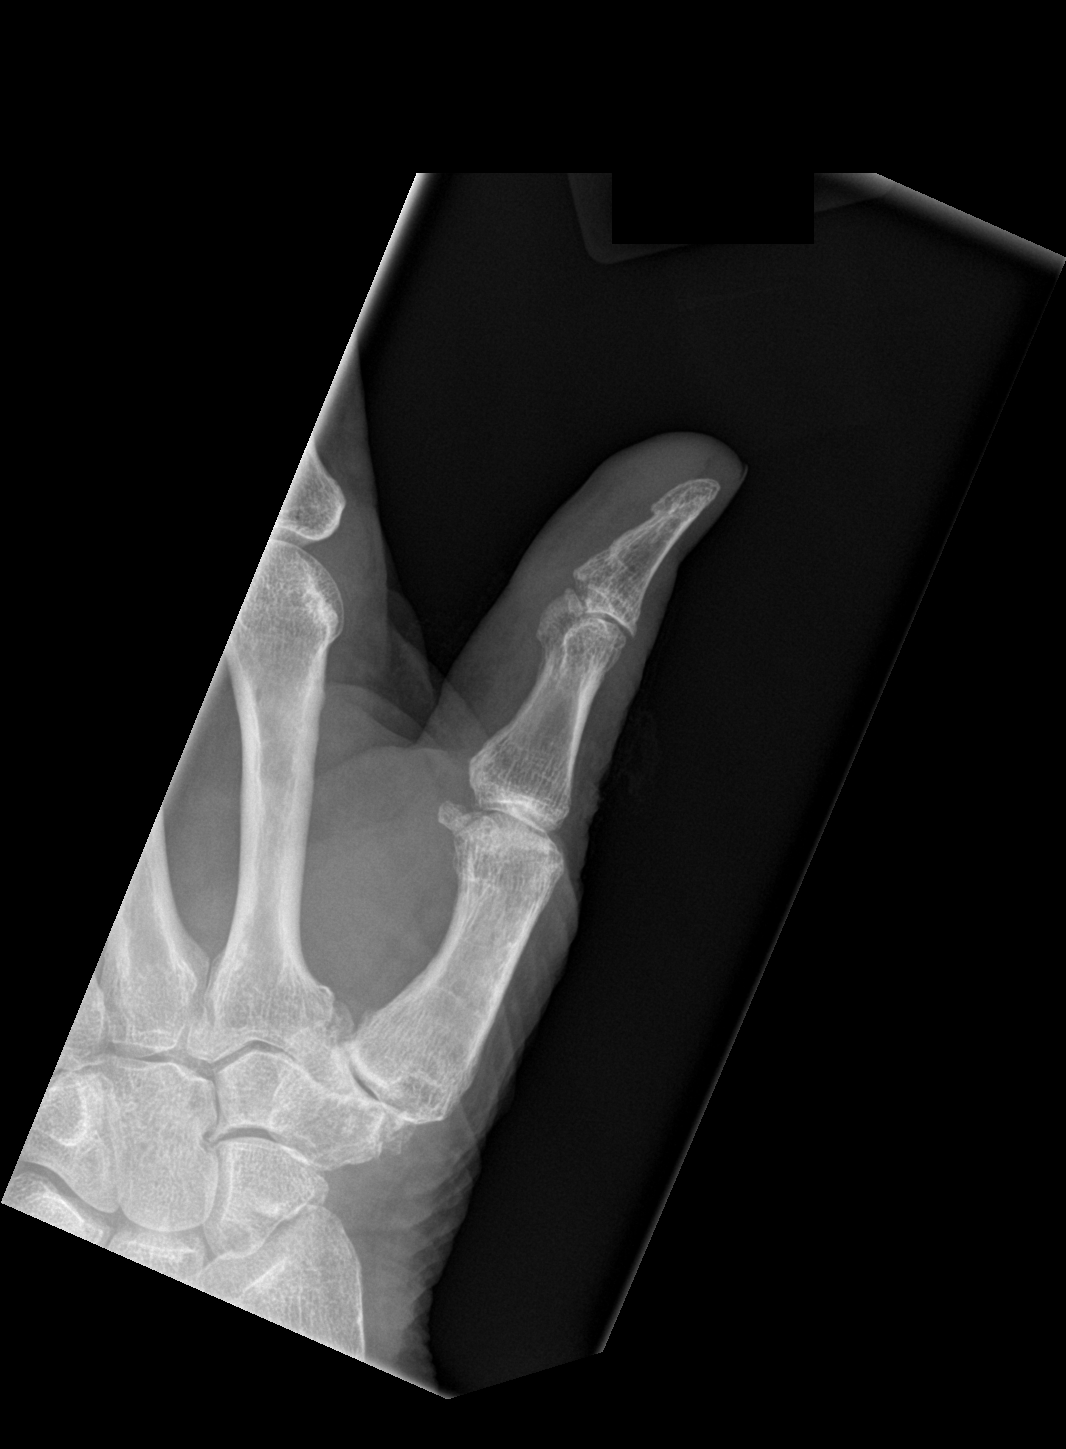

[finger lat]
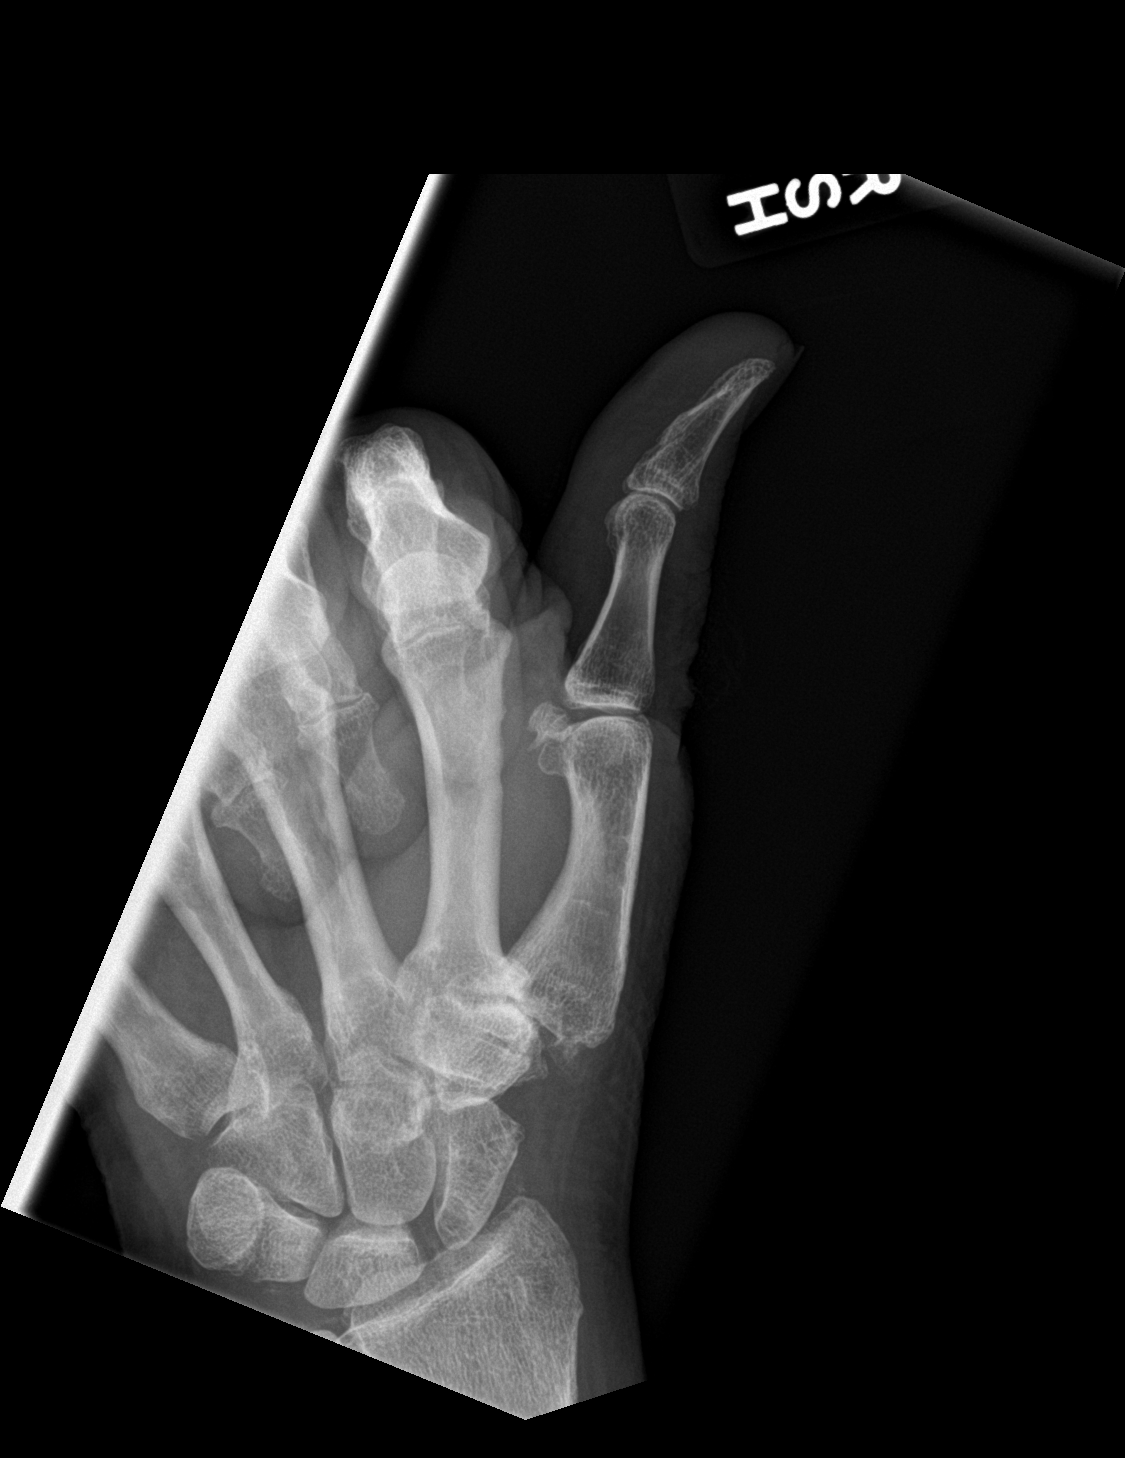

[3 of 3 positions shown; findings below may reference images not displayed]

FINDINGS: Fracture of the distal aspect of the first proximal phalanx is noted
the previously seen avulsion fracture is not as well appreciated on
today's exam. No new abnormality is seen.
IMPRESSION: Fracture of the distal aspect of the first proximal phalanx. No
other focal abnormality is noted.

## 2019-08-04 NOTE — Progress Notes (Signed)
Physical Therapy Session Note  Patient Details  Name: [name] MRN: [id number] Date of Birth: [birth year]  Today's Date: 08/04/2019 PT Individual Time: 1405-1450 PT Individual Time Calculation (min): 45 min   Short Term Goals: Week 2:  PT Short Term Goal 1 (Week 2): Patient will perform bed mobility with supervision. PT Short Term Goal 2 (Week 2): Patient will ambulate >125 ft consistently CGA without AD. PT Short Term Goal 3 (Week 2): Patient will tolerate dynamic standing balance >5 min with min A. PT Short Term Goal 4 (Week 2): Patient will perfrom 8-6" steps using R rail with CGA.  Skilled Therapeutic Interventions/Progress Updates: Pt presented in w/c agreeable to therapy. Pt states some mild pain in L ribs but no intervention needed. Pt's wife present and participated in session during gait. Wife also brought walking stick per primary PT's request. Discussed use of walking stick vs SBQC. Pt ambulated to rehab gym with [facility] with CGA and PTA noting that pt would intermittently carry stick vs using stick for support. In rehab gym pt ambulated with walking stick with PTA noting when cued to slow down pt would demonstrate appropriate sequencing with RW. Pt ambulated approx 161f with walking stick with CGA. Pt also ambulated 568fx 2 weaving through cones with walking stick with PTA providing verbal cues for decreasing speed in which pt was able to demonstrate improved safety. Pt then trialed use of SPC ambulating straight path then weaving through cones. Pt appeared to ambulate with same improved sequencing as with walking stick. Pt did note to have increased dyspnea after bouts of ambulation and recovered with seated rest. Pt then participated in ascending/descending 4 steps x 2 with CGA and verbal cues to make sure that foot is squarely planted on step vs just placing forefoot on step. Pt instructed to use step to pattern and required verbal cues for correct placement of SPC. Pt  demosntrated improved sequencing with second bout vs first. Due to pt's fatigue and increased exertion noted with second set of steps, PTA obtained w/c from pt's room and pt transported back to room. Pt requesting to remain in w/c at end of session and left with belt alarm on, call bell within reach and needs met.      Therapy Documentation Precautions:  Precautions Precautions: Fall Precaution Booklet Issued: No Precaution Comments: pt requires verbal cues for WB precautions during session Required Braces or Orthoses: Splint/Cast, Sling Cervical Brace: Hard collar (for comfort, pt does not wear often per pt report) Splint/Cast: thumb spica splint, Hard collar changed to 'for comfort.' Pt has been opting for no hard collar. Other Brace: thumb spica splint L hand  Restrictions Weight Bearing Restrictions: Yes LUE Weight Bearing: Non weight bearing Other Position/Activity Restrictions: L rib fxs 1-7 General:   Vital Signs: Therapy Vitals Temp: 97.8 F (36.6 C) Pulse Rate: 80 Resp: 19 BP: 109/65 Patient Position (if appropriate): Sitting Oxygen Therapy SpO2: 98 % O2 Device: Room Air Pain:   Mobility:   Locomotion :    Trunk/Postural Assessment :    Balance:   Exercises:   Other Treatments:      Therapy/Group: Individual Therapy  Marissa Lowrey 08/04/2019, 4:45 PM

## 2019-08-04 NOTE — Progress Notes (Signed)
Speech Language Pathology Daily Session Note  Patient Details  Name: [name] MRN: [id number] Date of Birth: [birth year]  Today's Date: 08/04/2019 SLP Individual Time: 0725-0820 SLP Individual Time Calculation (min): 55 min  Short Term Goals: Week 2: SLP Short Term Goal 1 (Week 2): STGs=LTGs due to ELOS  Skilled Therapeutic Interventions: Skilled treatment session focused on cognitive goals. SLP facilitated session by re-administering the Cognistat. Patient continues to demonstrates mild deficits in visual construction tasks and moderate deficits in short-term memory. SLP provided education regarding strategies to utilize at home to maximize recall and carryover of functional information, especially the importance of routine. Patient also generated a list of activities he can participate in safely at home with supervision verbal cues. Patient left upright in the wheelchair with his wife present. Continue with current plan of care.      Pain Pain Assessment Pain Scale: 0-10 Pain Score: 0-No pain  Therapy/Group: Individual Therapy  Avira Tillison 08/04/2019, 10:05 AM

## 2019-08-04 NOTE — Plan of Care (Signed)
  Problem: RH SAFETY Goal: RH STG ADHERE TO SAFETY PRECAUTIONS W/ASSISTANCE/DEVICE Description: STG Adhere to Safety Precautions With mod I Assistance/Device. Outcome: Progressing   Problem: RH PAIN MANAGEMENT Goal: RH STG PAIN MANAGED AT OR BELOW PT'S PAIN GOAL Description: Pain will be reported less than 4 out of 10 with min assit Outcome: Progressing

## 2019-08-04 NOTE — Progress Notes (Signed)
Occupational Therapy Session Note  Patient Details  Name: [name] MRN: [id number] Date of Birth: 28-Dec-1933  Today's Date: 08/04/2019 OT Individual Time: 1035-1130 OT Individual Time Calculation (min): 55 min    Short Term Goals: Week 2:  OT Short Term Goal 1 (Week 2): Pt will groom in standing with CGA to demo improved standing tolerance OT Short Term Goal 2 (Week 2): Pt will wash 10/10 body parts with AE PRN and CGA OT Short Term Goal 3 (Week 2): Pt will doff/don shirt with S and no more than min VC OT Short Term Goal 4 (Week 2): Pt will don footwear with no more than MIN A and MIN VC for AE technique  Skilled Therapeutic Interventions/Progress Updates:    Pt received sitting up in the w/c with no c/o pain, agreeable to shower. Updated pt on conference details and changed d/c date- moved up d/t progress!  Pt completed ambulatory transfer into the bathroom with use of quad cane with close (S). Pt transferred onto shower chair requiring min cueing for technique. Pt completed bathing sit <> stand with close (S). Pt cued for lifting BLE to wash more thoroughly. Pt transferred back to w/c and donned shirt with (S) after correction in which he attempted to put in head first and became confused with orientation of shirt. Pt provided with reacher for LB dressing which he was able to perform with min A to straighten out waistband. Pt required rest break following donning of pants. Min cueing throughout for reduction of L hand assistance. Sling donned with max A. Pt completed 200 ft of functional mobility with quad cane and (S) overall. Pt had one instance of reporting feeling dizzy but it resolved with a rest break. Pt was returned to his w/c and left sitting up with chair alarm set.   Therapy Documentation Precautions:  Precautions Precautions: Fall Precaution Booklet Issued: No Precaution Comments: pt requires verbal cues for WB precautions during session Required Braces or Orthoses:  Splint/Cast, Sling Cervical Brace: Hard collar (for comfort, pt does not wear often per pt report) Splint/Cast: thumb spica splint, Hard collar changed to 'for comfort.' Pt has been opting for no hard collar. Other Brace: thumb spica splint L hand  Restrictions Weight Bearing Restrictions: Yes LUE Weight Bearing: Non weight bearing Other Position/Activity Restrictions: L rib fxs 1-7   Therapy/Group: Individual Therapy  [name] 08/04/2019, 6:51 AM

## 2019-08-04 NOTE — Progress Notes (Signed)
Patient ID: [name], male   DOB: 01/07/1934, 86 y.o.   MRN: [id number]  SW met with pt wife in room to provide updates from team conference, and change in d/c date to 08/07/2019. Pt was out of room due to x-rays. Discussed d/c recommendations OPT PT/OT/ST and will confirm DME needed (I.e. can) and SW will order TTB.    , MSW, LCSWA Office: [phone] Cell: [phone] Fax: [phone]  

## 2019-08-04 NOTE — Patient Care Conference (Signed)
Inpatient RehabilitationTeam Conference and Plan of Care Update Date: 08/04/2019   Time: 1:21 PM    Patient Name: [name]      Medical Record Number: [id number]  Date of Birth: [birth year] Sex: Male         Room/Bed: 4W20C/4W20C-01 Payor Info: Payor: [employer] / Plan: [employer] PPO / Product Type: *No Product type* /    Admit Date/Time:  07/23/2019  2:35 PM  Primary Diagnosis:  TBI (traumatic brain injury) [facility])  Patient Active Problem List   Diagnosis Date Noted  . TBI (traumatic brain injury) ([place]) 07/23/2019  . Traumatic closed fracture of distal clavicle with minimal displacement, left, initial encounter 07/19/2019  . Traumatic brain injury with loss of consciousness ([place])   . Multiple trauma   . Benign prostatic hyperplasia   . Essential hypertension   . AKI (acute kidney injury) ([place])   . Stage 3b chronic kidney disease   . Prediabetes   . Acute blood loss anemia   . Thrombocytopenia ([place])   . Fall 07/14/2019  . Prediabetes   . Eosinophilia   . Need for prophylactic vaccination and inoculation against influenza 11/12/2013  . Nocturnal leg cramps 11/23/2012  . Pain in joint, shoulder region 11/23/2012  . Fall 11/04/2012  . Hematoma 11/04/2012  . Rib pain 11/04/2012  . Acute upper respiratory infections of unspecified site 11/04/2012  . HYPERLIPIDEMIA TYPE IIB / III 03/08/2008  . HYPERTENSION, BENIGN 03/08/2008  . CAD, NATIVE VESSEL 03/08/2008    Expected Discharge Date: Expected Discharge Date: 08/07/19  Team Members Present: Physician leading conference: Dr. Alger Simons Care Coodinator Present: Dorthula Nettles, RN, BSN, CRRN;Loralee Pacas, [place] Nurse Present: Other (comment) (Angelette Belk, RN) PT Present: Apolinar Junes, PT OT Present: Laverle Hobby, OT SLP Present: Weston Anna, SLP PPS Coordinator present : Gunnar Fusi, SLP     Current Status/Progress Goal Weekly Team Focus  Bowel/Bladder   Continent B&B   Maintain Continence B&B  Assess toileting needs PRN   Swallow/Nutrition/ Hydration             ADL's   Min A dressing, improvements in sequencing. Min cueing still required for adherence to WB precautions. Family education initiated and scheduled. CGA transfers  min A- (S)  d/c planning, family education, cognitive retraining, ADL retraining   Mobility   CGA-close supervision overall, gait 150 feet, 12 steps 1 rail  Supervision overall  Gait and stair training, balance, actvity tolerance, pain management, functional mobility, fall prevention, patient/family education   Communication   Mod I  Mod I  education   Safety/Cognition/ Behavioral Observations  Supervision-Mod I  Mod I  education   Pain   No complaints of intolerable pain. Tramadol PRN and Tylenol scheduled.  Maintain decrease in pain level  Assess pain Qshift or PRN   Skin   Ecchymosis to L back/hip area. Appears to be healing.  Promote healing. Prevent break down.  Assess skin Qshift    Rehab Goals Patient on target to meet rehab goals: Yes *See Care Plan and progress notes for long and short-term goals.     Barriers to Discharge  Current Status/Progress Possible Resolutions Date Resolved   Nursing                  PT  Home environment access/layout;Inaccessible home environment;Weight bearing restrictions;Behavior  Patient has 5 STE and 13 steps to main bed/bath at home, NWB through L UE, patient has STM deficits and some decreased safety awareness with mobility  Initiated hands-on trianing with patient's wife, making good progress with stair training           OT                  SLP Weight bearing restrictions              Care Coordinator Decreased caregiver support;Lack of/limited family support              Discharge Planning/Teaching Needs:  D/c to home with wife who will provide 24/7 care  Family education as recommended by therapy   Team Discussion:  Continent of bowel/bladder, pain controlled. Making  good progress with quad cane, wife bringing in walking stick. Min cueing for weight bearing restrictions. Working on Nurse, adult for stairs. Wife is very attentive and available. SLP continues to work on strategies for home/Mod I.   Revisions to Treatment Plan:  none    Medical Summary Current Status: improved pain control, have to be careful with meds d/t SE. elavil helps sleep, earlier scheduling avoids morning hangover Weekly Focus/Goal: pain mgt, sleep-wake, bp control  Barriers to Discharge: Medical stability   Possible Resolutions to Barriers: vs monitoring, med review and adjustments daily   Continued Need for Acute Rehabilitation Level of Care: The patient requires daily medical management by a physician with specialized training in physical medicine and rehabilitation for the following reasons: Direction of a multidisciplinary physical rehabilitation program to maximize functional independence : Yes Medical management of patient stability for increased activity during participation in an intensive rehabilitation regime.: Yes Analysis of laboratory values and/or radiology reports with any subsequent need for medication adjustment and/or medical intervention. : Yes   I attest that I was present, lead the team conference, and concur with the assessment and plan of the team.   Cristi Loron 08/04/2019, 1:21 PM

## 2019-08-04 NOTE — Progress Notes (Signed)
Physical Therapy Session Note  Patient Details  Name: [name] MRN: [id number] Date of Birth: 12/19/1933  Today's Date: 08/04/2019 PT Individual Time: 6712-4580 PT Individual Time Calculation (min): 38 min   Short Term Goals: Week 2:  PT Short Term Goal 1 (Week 2): Patient will perform bed mobility with supervision. PT Short Term Goal 2 (Week 2): Patient will ambulate >125 ft consistently CGA without AD. PT Short Term Goal 3 (Week 2): Patient will tolerate dynamic standing balance >5 min with min A. PT Short Term Goal 4 (Week 2): Patient will perfrom 8-6" steps using R rail with CGA.  Skilled Therapeutic Interventions/Progress Updates:   Pt received sitting in w/c and agreeable to therapy session. Pt wearing L UE sling and L thica splint during session. Pt's wife present and reports her primary concern is the 12-13steps to get to their 2nd floor bedroom - she reports there is a L HR all the way and only R HR part of the way that transitions into a landing - wife is going to take photos to show primary therapist. Sit<>stands with and without quad-cane with CGA/close supervision for safety during session. Started ambulating with quad-cane ~11ft out of the room then back but pt noted to just be carrying AD therefore transitioned to no AD and ambulated ~186ft to main therapy gym with CGA and 1x min assist due to catching R toes on floor as he has slight shuffle gait pattern - cued pt to perform more heel to toe walking to increase foot clearance and heel strike on initial contact. Ascended/descended 8 steps using R UE support on each HR with CGA for steadying. Ambulated ~30ft backwards x3 with CGA/min assist for balance progressed to faster backwards walking then dual-cognitive task of counting steps while keeping increased speed. When walking forward between each bout continued cuing for increased heel strike on initial contact to promote increased step lengths and increased foot clearance.  Lateral side stepping ~57ft x2 each way at hallway rail, no UE support, with min assist for balance and cuing for increased step length and increased speed of movement. Pam, PA present and reports that pt no longer needs to wear L UE sling. Gait ~173ft back to room, no AD, with CGA for steadying and pt demonstrating carryover of heel strike on initial contact - able to navigate back to room without cuing. Pt left seated in w/c with needs in reach and seat belt alarm on.  Therapy Documentation Precautions:  Precautions Precautions: Fall Precaution Booklet Issued: No Precaution Comments: pt requires verbal cues for WB precautions during session Required Braces or Orthoses: Splint/Cast, Sling Cervical Brace: Hard collar (for comfort, pt does not wear often per pt report) Splint/Cast: thumb spica splint, Hard collar changed to 'for comfort.' Pt has been opting for no hard collar. Other Brace: thumb spica splint L hand  Restrictions Weight Bearing Restrictions: Yes LUE Weight Bearing: Non weight bearing Other Position/Activity Restrictions: L rib fxs 1-7  Pain:   No specific complaints of pain during session.    Therapy/Group: Individual Therapy  Tawana Scale, PT, DPT 08/04/2019, 7:52 AM

## 2019-08-04 NOTE — Progress Notes (Signed)
[place] PHYSICAL MEDICINE & REHABILITATION PROGRESS NOTE   Subjective/Complaints:  Up in chair reading his birthday card from wife! Slept well. Wife and patient note a little confusion in evening. Feels fine this morning.   ROS: Patient denies fever, rash, sore throat, blurred vision, nausea, vomiting, diarrhea, cough, shortness of breath or chest pain, headache, or mood change.   Objective:   No results found. Recent Labs    08/03/19 0648  WBC 6.3  HGB 8.6*  HCT 27.6*  PLT 463*   Recent Labs    08/03/19 0648  NA 138  K 4.6  CL 103  CO2 25  GLUCOSE 97  BUN 26*  CREATININE 1.56*  CALCIUM 9.0    Intake/Output Summary (Last 24 hours) at 08/04/2019 1110 Last data filed at 08/04/2019 0900 Gross per 24 hour  Intake 120 ml  Output 150 ml  Net -30 ml     Physical Exam: Vital Signs Blood pressure (!) 159/77, pulse 80, temperature 98.4 F (36.9 C), temperature source Oral, resp. rate 17, height 5\' 8"  (1.727 m), weight 95.3 kg, SpO2 95 %. Constitutional: No distress . Vital signs reviewed. HEENT: EOMI, oral membranes moist Neck: supple Cardiovascular: RRR without murmur. No JVD    Respiratory/Chest: CTA Bilaterally without wheezes or rales. Normal effort    GI/Abdomen: BS +, non-tender, non-distended Ext: no clubbing, cyanosis, or edema Psych: very pleasant and cooperative Musculoskeletal:     LUE tender with movement. Wearing sling, wrist splint in place.   Neurological: Ox3 some ongoing STM deficits, good insight and awareness . RUE 4/5 prox to distal. LUE limited by sling. RLE 3-/5 HF, KE to 4/5 ADF/PF. LLE 2/5 HF, 3/5 KE and 4/5 ADF/PF. Normal sensation--motor and sensory stable  Assessment/Plan: 1. Functional deficits secondary to TBI with polytrauma which require 3+ hours per day of interdisciplinary therapy in a comprehensive inpatient rehab setting.  Physiatrist is providing close team supervision and 24 hour management of active medical problems listed  below.  Physiatrist and rehab team continue to assess barriers to discharge/monitor patient progress toward functional and medical goals  Care Tool:  Bathing    Body parts bathed by patient: Left arm, Chest, Abdomen, Front perineal area, Right upper leg, Left upper leg, Face, Buttocks, Right arm, Right lower leg, Left lower leg   Body parts bathed by helper: Right arm, Right lower leg, Left lower leg     Bathing assist Assist Level: Contact Guard/Touching assist     Upper Body Dressing/Undressing Upper body dressing   What is the patient wearing?: Pull over shirt    Upper body assist Assist Level: Supervision/Verbal cueing    Lower Body Dressing/Undressing Lower body dressing      What is the patient wearing?: Pants, Underwear/pull up     Lower body assist Assist for lower body dressing: Contact Guard/Touching assist     Toileting Toileting    Toileting assist Assist for toileting: Contact Guard/Touching assist     Transfers Chair/bed transfer  Transfers assist     Chair/bed transfer assist level: Contact Guard/Touching assist Chair/bed transfer assistive device: Cane   Locomotion Ambulation   Ambulation assist   Ambulation activity did not occur: Safety/medical concerns  Assist level: Supervision/Verbal cueing Assistive device: Cane-quad Max distance: 15 ft   Walk 10 feet activity   Assist  Walk 10 feet activity did not occur: Safety/medical concerns  Assist level: Supervision/Verbal cueing Assistive device: Cane-quad   Walk 50 feet activity   Assist Walk 50 feet with  2 turns activity did not occur: Safety/medical concerns  Assist level: Minimal Assistance - Patient > 75% Assistive device: No Device    Walk 150 feet activity   Assist Walk 150 feet activity did not occur: Safety/medical concerns  Assist level: Minimal Assistance - Patient > 75% Assistive device: No Device    Walk 10 feet on uneven surface  activity   Assist  Walk 10 feet on uneven surfaces activity did not occur: Safety/medical concerns   Assist level: Minimal Assistance - Patient > 75% Assistive device: Cane-quad, Other (comment)   Wheelchair     Assist Will patient use wheelchair at discharge?: Yes (Per PT long term goals) Type of Wheelchair: Manual    Wheelchair assist level: Supervision/Verbal cueing Max wheelchair distance: 150 ft    Wheelchair 50 feet with 2 turns activity    Assist        Assist Level: Supervision/Verbal cueing   Wheelchair 150 feet activity     Assist      Assist Level: Supervision/Verbal cueing   Blood pressure (!) 159/77, pulse 80, temperature 98.4 F (36.9 C), temperature source Oral, resp. rate 17, height 5\' 8"  (1.727 m), weight 95.3 kg, SpO2 95 %.  Medical Problem List and Plan: 1.  Impaired secondary to TBI             -patient may shower             -ELOS/Goals: S with PT, OT, and SLP--ELOS 7/2 (date moved up)  --Continue CIR therapies today including PT, OT, and SLP  2.  Antithrombotics: -DVT/anticoagulation:  Pharmaceutical: Lovenox. Not ambulating.             -antiplatelet therapy:  3. Pain Management:   tylenol scheduled at 650 mg qid.    -Ultram prn for moderate to severe pain (was taking at home)- providing better pain relief.    -Thoracic and Lumbar XR ordered 6/22. Continue heating pads.   6/29 pain fairly well controlled-limit tramadol d/t nausea, ?dizziness, utilize tylenol when possible as well as ice and heat.  4. Mood: LCSW to follow for evaluation and support.              -antipsychotic agents: N/a  -wife appears very supportive 5. Neuropsych: This patient is capable of making decisions on his own behalf.  -cognition demonstrating substantial improvement      6/28-29 sleeping better. Mild HS confusion likely d/t 1900 amitriptyline but he's sleeping well and very alert in AM--> continue   -continue melatonin 3mg  qhs   -on lexapro daily as home med 6.  Skin/Wound Care: Routine pressure relief measures.  7. Fluids/Electrolytes/Nutrition: encourage PO. 8. HTN: Monitor BP tid--continue cardura. Lotrel on hold.   6/24: well controlled. Less orthostatic in therapy with TEDs and abdominal binder.   6/29 bp's more robust, sl elevated today---given recent dizziness and hypotension, we can tolerate elevation for now 9. Left thumb dislocation: s/p relocation. Continue spica splint 10. Distal clavicle fracture- continue sling 11. New onset diabetes: Hgb A1c-6.5--Carb modified diet. Will monitor BS ac/hs for now.  12. Acute urinary retention: cardura and urecholine  6/29 now voiding frequently. Urecholine already decreased yesterday   -will stop entirely today 13.  ABLA:H/H on downward trend       -hgb up to 8.6 6/28, may be concentration component, still no signs of bleeding 14. Acute renal failure on chronic renal failure: SCr 1.97 at admisison:    -BUN/Cr down to 22/1.39 today  -BUN/Cr sl up to  26/1.56 6/28---encourage appropraite fluids   6/28-recheck labs Thursday 15. Constipation: Patient reporting hard balls yesterday. Stool now loose 16. HLD: 6/23: Pharmacy discussed  that patient was on Zetia and Lipitor at home and asked if we should restart this.    -lipid panel shows mildly elevated LDL to 115. Otherwise normal.   -will not resume while in hospital, diet ed     LOS: 12 days A FACE TO [place] 08/04/2019, 11:10 AM

## 2019-08-05 ENCOUNTER — Inpatient Hospital Stay (HOSPITAL_COMMUNITY): Payer: PPO | Admitting: Speech Pathology

## 2019-08-05 ENCOUNTER — Inpatient Hospital Stay (HOSPITAL_COMMUNITY): Payer: PPO

## 2019-08-05 MED ORDER — ACETAMINOPHEN 325 MG PO TABS
650.0000 mg | ORAL_TABLET | Freq: Three times a day (TID) | ORAL | Status: DC
  Administered 2019-08-05 – 2019-08-07 (×5): 650 mg via ORAL
  Filled 2019-08-05 (×5): qty 2

## 2019-08-05 MED ORDER — SODIUM CHLORIDE 0.9 % IV BOLUS
250.0000 mL | Freq: Once | INTRAVENOUS | Status: AC
  Administered 2019-08-05: 250 mL via INTRAVENOUS

## 2019-08-05 NOTE — Progress Notes (Signed)
Occupational Therapy Session Note  Patient Details  Name: [name] MRN: [id number] Date of Birth: 1933-10-17  Today's Date: 08/05/2019 OT Individual Time: 1000-1100 OT Individual Time Calculation (min): 60 min    Short Term Goals: Week 2:  OT Short Term Goal 1 (Week 2): Pt will groom in standing with CGA to demo improved standing tolerance OT Short Term Goal 2 (Week 2): Pt will wash 10/10 body parts with AE PRN and CGA OT Short Term Goal 3 (Week 2): Pt will doff/don shirt with S and no more than min VC OT Short Term Goal 4 (Week 2): Pt will don footwear with no more than MIN A and MIN VC for AE technique  Skilled Therapeutic Interventions/Progress Updates:    Pt received in w/c ready for therapy session with his wife present for hands on training. Pt agreeable to take shower in tub room for maximal carryover to home environment. Pt taken via w/c for time management. Pt required min-mod questioning cues for sequencing transfer in and out of tub using TTB. Pt's wife doing great job of cueing herself. (S) for all transfers this session using quad cane. Pt requiring cueing for sequencing doffing of clothes. With use of personal LH sponge pt was able to complete all bathing at close (S) level. Pt used reacher to don underwear and pants with min A for sequencing orientation of pants. Edu provided throughout session re safety and fall prevention at home.  Pt returned to his w/c and room. Pt left sitting up with all needs met, wife present.   Therapy Documentation Precautions:  Precautions Precautions: Fall Precaution Booklet Issued: No Precaution Comments: pt requires verbal cues for WB precautions during session Required Braces or Orthoses: Splint/Cast, Sling Cervical Brace: Hard collar (for comfort, pt does not wear often per pt report) Splint/Cast: thumb spica splint, Hard collar changed to 'for comfort.' Pt has been opting for no hard collar. Other Brace: thumb spica splint L hand   Restrictions Weight Bearing Restrictions: Yes LUE Weight Bearing: Non weight bearing Other Position/Activity Restrictions: L rib fxs 1-7   Therapy/Group: Individual Therapy  [name] 08/05/2019, 7:02 AM

## 2019-08-05 NOTE — Progress Notes (Signed)
Patient ID: [name], male   DOB: 03-02-1933, 86 y.o.   MRN: [id number]  SW faxed outpatient order to [facility] (p:[phone]/f:[phone]) for PT/OT/ST.  SW ordered TTB and SPC through [facility] via [facility].   *SW met with pt and pt wife in room to review above. No questions/concerns reported.   Loralee Pacas, MSW, [place] Office: [phone] Cell: [phone] Fax: [phone]

## 2019-08-05 NOTE — Progress Notes (Signed)
[facility] PHYSICAL MEDICINE & REHABILITATION PROGRESS NOTE   Subjective/Complaints: [name] asks that his Tylenol be decreased to three times per day- ordered. His wife asks about his XR results. Discussed results and that order has been placed for d/c of sling and initiation of ROM exercises. Has noted continued painless hematuria. He did not have this prior to admission. Has not followed with urology.   ROS: Patient denies fever, rash, sore throat, blurred vision, nausea, vomiting, diarrhea, cough, shortness of breath or chest pain, headache, or mood change.   Objective:   DG Clavicle Left  Result Date: 08/04/2019 CLINICAL DATA:  History of recent fall with left clavicular pain, initial encounter EXAM: LEFT CLAVICLE - 2+ VIEWS COMPARISON:  Chest x-ray from 07/14/2019 FINDINGS: Degenerative changes of the acromioclavicular joint are seen. The known distal left clavicular fracture is not well appreciated on today's exam due to positioning. No new fracture is seen. The underlying bony thorax is within limits. IMPRESSION: Degenerative changes at the acromioclavicular joint. The known distal left clavicular fracture is not well appreciated on this study. Electronically Signed   By: Inez Catalina M.D.   On: 08/04/2019 13:26   DG Finger Thumb Left  Result Date: 08/04/2019 CLINICAL DATA:  Recent fall with thumb pain, initial encounter EXAM: LEFT THUMB 2+V COMPARISON:  07/14/2019 FINDINGS: Fracture of the distal aspect of the first proximal phalanx is noted the previously seen avulsion fracture is not as well appreciated on today's exam. No new abnormality is seen. IMPRESSION: Fracture of the distal aspect of the first proximal phalanx. No other focal abnormality is noted. Electronically Signed   By: Inez Catalina M.D.   On: 08/04/2019 13:28   Recent Labs    08/03/19 0648  WBC 6.3  HGB 8.6*  HCT 27.6*  PLT 463*   Recent Labs    08/03/19 0648  NA 138  K 4.6  CL 103  CO2 25  GLUCOSE 97   BUN 26*  CREATININE 1.56*  CALCIUM 9.0    Intake/Output Summary (Last 24 hours) at 08/05/2019 1224 Last data filed at 08/05/2019 0900 Gross per 24 hour  Intake 240 ml  Output --  Net 240 ml     Physical Exam: Vital Signs Blood pressure (!) 163/84, pulse 92, temperature 98.5 F (36.9 C), temperature source Oral, resp. rate 19, height 5\' 8"  (1.727 m), weight 95.3 kg, SpO2 99 %. General: Alert and oriented x 3, No apparent distress HEENT: Head is normocephalic, atraumatic, PERRLA, EOMI, sclera anicteric, oral mucosa pink and moist, dentition intact, ext ear canals clear,  Neck: Supple without JVD or lymphadenopathy Heart: Reg rate and rhythm. No murmurs rubs or gallops Chest: CTA bilaterally without wheezes, rales, or rhonchi; no distress GI/Abdomen: BS +, non-tender, non-distended Ext: no clubbing, cyanosis, or edema Psych: very pleasant and cooperative Musculoskeletal:     LUE tender with movement. Wearing sling, wrist splint in place.   Neurological: Ox3 some ongoing STM deficits, good insight and awareness . RUE 4/5 prox to distal. LUE limited by sling. RLE 3-/5 HF, KE to 4/5 ADF/PF. LLE 2/5 HF, 3/5 KE and 4/5 ADF/PF. Normal sensation--motor and sensory stable  Assessment/Plan: 1. Functional deficits secondary to TBI with polytrauma which require 3+ hours per day of interdisciplinary therapy in a comprehensive inpatient rehab setting.  Physiatrist is providing close team supervision and 24 hour management of active medical problems listed below.  Physiatrist and rehab team continue to assess barriers to discharge/monitor patient progress toward functional and medical  goals  Care Tool:  Bathing    Body parts bathed by patient: Left arm, Chest, Abdomen, Front perineal area, Right upper leg, Left upper leg, Face, Buttocks, Right arm, Right lower leg, Left lower leg   Body parts bathed by helper: Right arm, Right lower leg, Left lower leg     Bathing assist Assist Level:  Contact Guard/Touching assist     Upper Body Dressing/Undressing Upper body dressing   What is the patient wearing?: Pull over shirt    Upper body assist Assist Level: Supervision/Verbal cueing    Lower Body Dressing/Undressing Lower body dressing      What is the patient wearing?: Pants, Underwear/pull up     Lower body assist Assist for lower body dressing: Minimal Assistance - Patient > 75%     Toileting Toileting    Toileting assist Assist for toileting: Contact Guard/Touching assist     Transfers Chair/bed transfer  Transfers assist     Chair/bed transfer assist level: Contact Guard/Touching assist Chair/bed transfer assistive device: Other (none)   Locomotion Ambulation   Ambulation assist   Ambulation activity did not occur: Safety/medical concerns  Assist level: Contact Guard/Touching assist Assistive device: No Device Max distance: 124ft   Walk 10 feet activity   Assist  Walk 10 feet activity did not occur: Safety/medical concerns  Assist level: Contact Guard/Touching assist Assistive device: No Device   Walk 50 feet activity   Assist Walk 50 feet with 2 turns activity did not occur: Safety/medical concerns  Assist level: Contact Guard/Touching assist Assistive device: No Device    Walk 150 feet activity   Assist Walk 150 feet activity did not occur: Safety/medical concerns  Assist level: Contact Guard/Touching assist Assistive device: No Device    Walk 10 feet on uneven surface  activity   Assist Walk 10 feet on uneven surfaces activity did not occur: Safety/medical concerns   Assist level: Minimal Assistance - Patient > 75% Assistive device: Cane-quad, Other (comment)   Wheelchair     Assist Will patient use wheelchair at discharge?: Yes (Per PT long term goals) Type of Wheelchair: Manual    Wheelchair assist level: Supervision/Verbal cueing Max wheelchair distance: 150 ft    Wheelchair 50 feet with 2 turns  activity    Assist        Assist Level: Supervision/Verbal cueing   Wheelchair 150 feet activity     Assist      Assist Level: Supervision/Verbal cueing   Blood pressure (!) 163/84, pulse 92, temperature 98.5 F (36.9 C), temperature source Oral, resp. rate 19, height 5\' 8"  (1.727 m), weight 95.3 kg, SpO2 99 %.  Medical Problem List and Plan: 1.  Impaired secondary to TBI             -patient may shower             -ELOS/Goals: S with PT, OT, and SLP--ELOS 7/2 (date moved up)  --Continue CIR therapies today including PT, OT, and SLP  2.  Antithrombotics: -DVT/anticoagulation:  Pharmaceutical: Lovenox. Not ambulating.             -antiplatelet therapy:  3. Pain Management:   tylenol scheduled at 650 mg qid.    -Ultram prn for moderate to severe pain (was taking at home)- providing better pain relief.    -Thoracic and Lumbar XR ordered 6/22. Continue heating pads.   6/29 pain fairly well controlled-limit tramadol d/t nausea, ?dizziness, utilize tylenol when possible as well as ice and heat.  6/30: pain currently well controlled. Requests decrease of Tylenol to TID- ordered.  4. Mood: LCSW to follow for evaluation and support.              -antipsychotic agents: N/a  -wife appears very supportive 5. Neuropsych: This patient is capable of making decisions on his own behalf.  -cognition demonstrating substantial improvement  6/28-29 sleeping better. Mild HS confusion likely d/t 1900 amitriptyline but he's sleeping well and very alert in AM--> continue   -continue melatonin 3mg  qhs   -on lexapro daily as home med 6. Skin/Wound Care: Routine pressure relief measures.  7. Fluids/Electrolytes/Nutrition: encourage PO. 8. HTN: Monitor BP tid--continue cardura. Lotrel on hold.   6/24: well controlled. Less orthostatic in therapy with TEDs and abdominal binder.   6/29 bp's more robust, sl elevated today---given recent dizziness and hypotension, we can tolerate elevation for  now 9. Left thumb dislocation: s/p relocation. Continue spica splint 10. Distal clavicle fracture- XR reviewed and discussed with patient and his wife. No new fractures. Order in system to d/c sling and initiate ROM exercises: discussed with patient and his wife.  11. New onset diabetes: Hgb A1c-6.5--Carb modified diet. Will monitor BS ac/hs for now.  12. Acute urinary retention: cardura and urecholine  6/29 now voiding frequently. Urecholine already decreased yesterday   -will stop entirely today 13.  ABLA:H/H on downward trend       -hgb up to 8.6 6/28, may be concentration component, still no signs of bleeding 14. Acute renal failure on chronic renal failure: SCr 1.97 at admisison:    -BUN/Cr down to 22/1.39 today  -BUN/Cr sl up to 26/1.56 6/28---encourage appropraite fluids   6/30: Patient has been drinking 6 glasses of water per day. Discussed unptrending Creatinine with his and wife. Wife is concerned about hematuria and patient was told this may be due to his AKI. Will bolus 250cc fluids today and repeat BMP is already ordered for tomorrow. Patient and wife in agreement. Also consulted urology and they will see patient as outpatient.  15. Constipation: Patient reporting hard balls yesterday. Stool now loose 16. HLD: 6/23: Pharmacy discussed  that patient was on Zetia and Lipitor at home and asked if we should restart this.    -lipid panel shows mildly elevated LDL to 115. Otherwise normal.   -will not resume while in hospital, diet ed     LOS: 13 days A FACE TO [place] 08/05/2019, 12:24 PM

## 2019-08-05 NOTE — Plan of Care (Signed)
  Problem: RH SAFETY Goal: RH STG ADHERE TO SAFETY PRECAUTIONS W/ASSISTANCE/DEVICE Description: STG Adhere to Safety Precautions With mod I Assistance/Device. Outcome: Progressing   Problem: RH PAIN MANAGEMENT Goal: RH STG PAIN MANAGED AT OR BELOW PT'S PAIN GOAL Description: Pain will be reported less than 4 out of 10 with min assit Outcome: Progressing

## 2019-08-05 NOTE — Progress Notes (Addendum)
Physical Therapy Discharge Summary  Patient Details  Name: [name] MRN: [id number] Date of Birth: [birth year]  Patient has met 6 of 7 long term goals due to improved activity tolerance, improved balance, improved postural control, increased strength, increased range of motion, decreased pain, ability to compensate for deficits, improved attention, improved awareness and improved coordination.  Patient to discharge at an ambulatory level with Supervision. Patient's care partners attended hands on training/education and are independent to provide the necessary physical and cognitive assistance at discharge.  Reasons goals not met: Patient has progressed to a functional ambulator and w/c goal is no longer appropriate.  Recommendation:  Patient will benefit from ongoing skilled PT services in outpatient setting to continue to advance safe functional mobility, address ongoing impairments in balance, strength, ROM, activity tolerance, functional mobility, gait, community mobility, memory, safety awareness, patient/caregiver education, and minimize fall risk.  Equipment: quad cane  Reasons for discharge: treatment goals met  Patient/family agrees with progress made and goals achieved: Yes  PT Discharge Precautions/Restrictions Precautions Precautions: Fall Required Braces or Orthoses: Splint/Cast Splint/Cast: L thumb spica splint Restrictions Weight Bearing Restrictions: Yes LUE Weight Bearing: Non weight bearing Pain Pain Assessment Pain Scale: Faces Faces Pain Scale: No hurt Perception  Perception Perception: Within Functional Limits Praxis Praxis: Intact  Cognition Overall Cognitive Status: Impaired/Different from baseline Arousal/Alertness: Awake/alert Orientation Level: Oriented X4 Attention: Focused Focused Attention: Appears intact Sustained Attention: Appears intact Memory: Impaired Safety/Judgment: Appears intact Sensation Sensation Light Touch: Appears  Intact Proprioception: Appears Intact Coordination Gross Motor Movements are Fluid and Coordinated: No Fine Motor Movements are Fluid and Coordinated: No Coordination and Movement Description: L UE NWB, decreased postural control and balance strategies, generalized deconditioning Motor  Motor Motor: Other (comment) Motor - Discharge Observations: Pain improving, generalized deconditioning, and continued mild coordination deficits in the LLE, improved since admission  Mobility Bed Mobility Bed Mobility: Sit to Supine;Supine to Sit Supine to Sit: Supervision/Verbal cueing Sit to Supine: Supervision/Verbal cueing Transfers Transfers: Sit to Stand;Stand to Sit;Stand Pivot Transfers Sit to Stand: Supervision/Verbal cueing Stand to Sit: Supervision/Verbal cueing Stand Pivot Transfers: Supervision/Verbal cueing Transfer (Assistive device): Large base quad cane Locomotion  Gait Ambulation: Yes Gait Assistance: Supervision/Verbal cueing Gait Distance (Feet): 150 Feet Assistive device: Large base quad cane Gait Gait: Yes Gait Pattern: Impaired Gait Pattern: Step-through pattern;Decreased step length - right;Decreased step length - left;Decreased hip/knee flexion - right;Decreased hip/knee flexion - left;Poor foot clearance - right;Poor foot clearance - left Gait velocity: decreased Stairs / Additional Locomotion Stairs: Yes Stairs Assistance: Contact Guard/Touching assist Stair Management Technique: One rail Right Number of Stairs: 12 Height of Stairs: 6 Wheelchair Mobility Wheelchair Mobility: No  Trunk/Postural Assessment  Cervical Assessment Cervical Assessment: Exceptions to [facility] Thoracic Assessment Thoracic Assessment: Exceptions to [facility] Lumbar Assessment Lumbar Assessment: Exceptions to [facility] Postural Control Postural Control: Deficits on evaluation (decreased/delayed, improved since admission)  Balance Balance Balance Assessed: Yes Static Sitting Balance Static Sitting -  Level of Assistance: 6: Modified independent (Device/Increase time) Dynamic Sitting Balance Dynamic Sitting - Balance Support: During functional activity Dynamic Sitting - Level of Assistance: 5: Stand by assistance Static Standing Balance Static Standing - Balance Support: Right upper extremity supported Static Standing - Level of Assistance: 5: Stand by assistance Dynamic Standing Balance Dynamic Standing - Balance Support: During functional activity;Right upper extremity supported Dynamic Standing - Level of Assistance: 5: Stand by assistance Extremity Assessment      RLE Assessment RLE Assessment: Within Functional Limits General Strength Comments: grossly 5/5 LLE  Assessment LLE Assessment: Exceptions to [facility] General Strength Comments: ankle and knee grossly 4+/5. hip flexion 4/5. hip adduciton and abduction 4/5    Cherie L Grunenberg PT, DPT Page Spiro, PT, DPT, CSRS  08/06/2019, 5:05 PM

## 2019-08-05 NOTE — Progress Notes (Signed)
Speech Language Pathology Daily Session Note  Patient Details  Name: [name] MRN: [id number] Date of Birth: [birth year]  Today's Date: 08/05/2019 SLP Individual Time: 0725-0825 SLP Individual Time Calculation (min): 60 min  Short Term Goals: Week 2: SLP Short Term Goal 1 (Week 2): STGs=LTGs due to ELOS  Skilled Therapeutic Interventions: Skilled treatment session focused on cognitive goals. SLP facilitated session by providing overall supervision level verbal cues for use of memory compensatory strategies for recall and for use of signage during a navigation around the unit. Patient independently requested results of recent x-ray, therefore, patient recorded a list of questions to ask the physician during rounding. Patient left upright in the wheelchair with  all needs within reach. Continue with current plan of care.      Pain No/Denies Pain   Therapy/Group: Individual Therapy  [name] 08/05/2019, 10:01 AM

## 2019-08-05 NOTE — Progress Notes (Signed)
Physical Therapy Session Note  Patient Details  Name: [name] MRN: [id number] Date of Birth: May 25, 1933  Today's Date: 08/05/2019 PT Individual Time: 1300-1415 PT Individual Time Calculation (min): 75 min   Short Term Goals: Week 2:  PT Short Term Goal 1 (Week 2): Patient will perform bed mobility with supervision. PT Short Term Goal 2 (Week 2): Patient will ambulate >125 ft consistently CGA without AD. PT Short Term Goal 3 (Week 2): Patient will tolerate dynamic standing balance >5 min with min A. PT Short Term Goal 4 (Week 2): Patient will perfrom 8-6" steps using R rail with CGA.  Skilled Therapeutic Interventions/Progress Updates:     Patient toileting with his wife assisting upon PT arrival. Patient's wife cleared by PT to assist patient with transfers and toileting in the room earlier this week. Patient alert and agreeable to PT session. Patient denied pain during session. Patient's wife participated in hands on training throughout session.   Therapeutic Activity: Bed Mobility: Patient performed supine to/from sit with close supervision in the ADL bed. Provided verbal cues for performing log rolling to his R side and using only his R UE to push up to his elbow then his hand to sit EOB. Transfers: Patient performed sit to/from stand from an arm cahri x5 and ADL recliner x1 with supervision for safety. Provided verbal cues provided by patient's wife for pushing up to stand and reaching back to sit for safety. Removed cane or walking stick prior to transfers to promote this.   Gait Training:  Patient ambulated >150 feet x2 using a SPC initially then switched to his personal walking stick with supervision for safety. Ambulated with decreased step length and height L>R with 1 minor LOB due to decreased L foot clearance, able to self correct with CGA-close supervision, increased B hip and knee flexion in stance, mild forward trunk lean, and downward head gaze. Noted increased gait  speed with decreased safety awareness initially while walking and talking. Provided verbal cues for increased step height for safety, placing walking stick ahead for improved balance, reduced gait speed for safety and educated on reducing distractions while ambulating. Patient also ambulated ~50 ft x3 with HHA on R with improved gait and safety awareness. Patient's wife provided assist x2. Recommended using this technique for household mobility for safety initially to reduce corridination challenges with quad can and keeping up with the cane on the stairs. Patient and his wife in agreement.   Stair Training: Patient ascended/descended 4 steps then 11 steps x2 (in stairwell) using R rail only with CGA with PT demonstrating initially then his wife provided guarding on last trial. Performed step-to gait pattern ascending forwards leading with R and descending sideways leading with R. Provided cues for technique and sequencing. Reviewed home set up, stairs to second level only have a full rail on the R side. Educated on performing same technique on 5 STE with B rails for consistency and safety.   Provided education for impulse control, following a daily schedule, management of environment to prevent overstimulation or distraction, 24/7 supervision, use of BSC by the bed at night for reduced fall risk, use of bed rail for safety for falling OOB and intermittent night time confusion, and other home modifications to prevent falls throughout session.    Patient sitting EOB with his wife in the room at end of session with breaks locked and all needs within reach.    Therapy Documentation Precautions:  Precautions Precautions: Fall Precaution Booklet Issued: No Precaution  Comments: pt requires verbal cues for WB precautions during session Splint/Cast: thumb spica splint Other Brace: thumb spica splint L hand  Restrictions Weight Bearing Restrictions: Yes LUE Weight Bearing: Non weight bearing Other  Position/Activity Restrictions: L rib fxs 1-7    Therapy/Group: Individual Therapy  Grantley Savage L Tanyla Stege PT, DPT  08/05/2019, 5:16 PM

## 2019-08-06 ENCOUNTER — Inpatient Hospital Stay (HOSPITAL_COMMUNITY): Payer: PPO

## 2019-08-06 ENCOUNTER — Ambulatory Visit (HOSPITAL_COMMUNITY): Payer: PPO | Admitting: Physical Therapy

## 2019-08-06 ENCOUNTER — Encounter (HOSPITAL_COMMUNITY): Payer: PPO | Admitting: Speech Pathology

## 2019-08-06 ENCOUNTER — Encounter (HOSPITAL_COMMUNITY): Payer: PPO

## 2019-08-06 ENCOUNTER — Inpatient Hospital Stay (HOSPITAL_COMMUNITY): Payer: PPO | Admitting: Physical Therapy

## 2019-08-06 DIAGNOSIS — S069X9A Unspecified intracranial injury with loss of consciousness of unspecified duration, initial encounter: Secondary | ICD-10-CM | POA: Diagnosis not present

## 2019-08-06 LAB — BASIC METABOLIC PANEL
Anion gap: 7 (ref 5–15)
BUN: 28 mg/dL — ABNORMAL HIGH (ref 8–23)
CO2: 24 mmol/L (ref 22–32)
Calcium: 8.9 mg/dL (ref 8.9–10.3)
Chloride: 107 mmol/L (ref 98–111)
Creatinine, Ser: 1.75 mg/dL — ABNORMAL HIGH (ref 0.61–1.24)
GFR calc Af Amer: 41 mL/min — ABNORMAL LOW (ref >60)
GFR calc non Af Amer: 35 mL/min — ABNORMAL LOW (ref >60)
Glucose, Bld: 93 mg/dL (ref 70–99)
Potassium: 4.1 mmol/L (ref 3.5–5.1)
Sodium: 138 mmol/L (ref 135–145)

## 2019-08-06 MED ORDER — LIDOCAINE 5 % EX PTCH: 1.0000 | MEDICATED_PATCH | CUTANEOUS | 0 refills | Status: DC

## 2019-08-06 MED ORDER — MELATONIN 3 MG PO TABS: 3.0000 mg | ORAL_TABLET | Freq: Every day | ORAL | 0 refills | Status: DC

## 2019-08-06 MED ORDER — FLUTICASONE FUROATE-VILANTEROL 200-25 MCG/INH IN AEPB: 1.0000 | INHALATION_SPRAY | Freq: Every day | RESPIRATORY_TRACT | 1 refills | Status: DC

## 2019-08-06 MED ORDER — DOXAZOSIN MESYLATE 2 MG PO TABS: 2.0000 mg | ORAL_TABLET | Freq: Every day | ORAL | 0 refills | Status: DC

## 2019-08-06 MED ORDER — ALBUTEROL SULFATE HFA 108 (90 BASE) MCG/ACT IN AERS: INHALATION_SPRAY | RESPIRATORY_TRACT | 0 refills | Status: AC

## 2019-08-06 MED ORDER — TRAMADOL HCL 50 MG PO TABS: 50.0000 mg | ORAL_TABLET | Freq: Four times a day (QID) | ORAL | 0 refills | Status: DC | PRN

## 2019-08-06 MED ORDER — METHOCARBAMOL 500 MG PO TABS: 500.0000 mg | ORAL_TABLET | Freq: Four times a day (QID) | ORAL | 0 refills | Status: DC | PRN

## 2019-08-06 MED ORDER — AMITRIPTYLINE HCL 10 MG PO TABS: 10.0000 mg | ORAL_TABLET | Freq: Every day | ORAL | 0 refills | Status: DC

## 2019-08-06 MED ORDER — CALCIUM-VITAMIN D 600-200 MG-UNIT PO TABS: 1.0000 | ORAL_TABLET | Freq: Two times a day (BID) | ORAL | 0 refills | Status: DC

## 2019-08-06 MED ORDER — POLYETHYLENE GLYCOL 3350 17 G PO PACK: 17.0000 g | PACK | Freq: Every day | ORAL | 0 refills | Status: DC

## 2019-08-06 MED ORDER — SODIUM CHLORIDE 0.45 % IV SOLN: INTRAVENOUS | Status: DC

## 2019-08-06 NOTE — Discharge Instructions (Signed)
Inpatient Rehab Discharge Instructions  [place] Discharge date and time:    Activities/Precautions/ Functional Status: Activity: no lifting, driving, or strenuous exercise for till cleared by MD Diet: low fat, low cholesterol diet Wound Care: keep wound clean and dry   Functional status:  ___ No restrictions     ___ Walk up steps independently ___ 24/7 supervision/assistance   ___ Walk up steps with assistance ___ Intermittent supervision/assistance  ___ Bathe/dress independently ___ Walk with walker     ___ Bathe/dress with assistance ___ Walk Independently    ___ Shower independently ___ Walk with assistance    ___ Shower with assistance ___ No alcohol     ___ Return to work/school ________   COMMUNITY REFERRALS UPON DISCHARGE:     Outpatient: PT     OT    ST             Agency:[facility]                                     [address]                                       [place], [place] [address]                         Phone: [phone]               Appointment Date/Time: Neuro OT eval on 08/13/19 at 12:30pm; Neuro PT eval on 08/13/19 at 1:15pm; Neuro ST eval on 7/13 at 11:45am  Medical Equipment/Items Ordered: quad cane, tub transfer bench and 3in1 bedside commode                                                 Agency/Supplier: [employer] [phone]   Special Instructions: No driving smoking or alcohol   My questions have been answered and I understand these instructions. I will adhere to these goals and the provided educational materials after my discharge from the hospital.  Patient/Caregiver Signature _______________________________ Date __________  Clinician Signature _______________________________________ Date __________  Please bring this form and your medication list with you to all your follow-up doctor's appointments.

## 2019-08-06 NOTE — Progress Notes (Signed)
Physical Therapy Session Note  Patient Details  Name: [name] MRN: [id number] Date of Birth: Jun 27, 1933  Today's Date: 08/06/2019 PT Individual Time: 0906-1000 and 0093-8182 PT Individual Time Calculation (min): 54 min and 42 min  Short Term Goals: Week 2:  PT Short Term Goal 1 (Week 2): Patient will perform bed mobility with supervision. PT Short Term Goal 2 (Week 2): Patient will ambulate >125 ft consistently CGA without AD. PT Short Term Goal 3 (Week 2): Patient will tolerate dynamic standing balance >5 min with min A. PT Short Term Goal 4 (Week 2): Patient will perfrom 8-6" steps using R rail with CGA.  Skilled Therapeutic Interventions/Progress Updates:    Session 1: Pt received sitting in w/c with his wife present and pt agreeable to therapy session. Pt wearing spica splint brace throughout. Discussed recommendations from primary therapist on using walking stick in community and either R HHA or quad-cane for household ambulation. Pt elected to use walking stick to practice community ambulation to therapy gym - demonstrates proper sequencing of AD and LE stepping with pt's wife providing proper close supervision for safety. Therapist educated pt/family on performing the following HEP and had pt perform 1 set of the following exercises to learn proper technique and safe set-up with pt's wife's assistance: Provided printed handout for pt. - repeated sit<>stands with hands across chest x12 reps - lateral side stepping at counter - cuing for larger steps and bringing feet together fully between each step - standing heel raises at counter x15 reps - backwards walking at counter  - repeated R/L step ups on 1st (6" height) stair using R UE support on R HR x10 reps per LE  Therapist educating pt/family on proper set-up and guarding for safety. Pt noted to have some shortness of breath during exercises and reports some fatigue this morning - provided ample rest breaks - pt's wife reports  Dr. Naaman Plummer aware of these symptoms. Gait ~11ft to ortho gym with pt's wife providing proper R HHA without cuing from therapist. Simulated ambulatory car transfer (sedan height) with pt's wife providing proper R HHA - educated on sitting first then placing feet in car and vice versa when exiting - pt demonstrates understanding. Gait ~235ft back to room with pt's wife providing proper R HHA without cuing. Pt left seated in w/c with needs in reach and his wife present.   Session 2: Pt received sitting EOB with his wife and 2 daughters present for hands-on education/training. Pt wearing spica splint brace throughout. Pt performed sit<>stands with either R HHA or quad-cane during session with close supervision for safety. Gait ~194ft to main therapy gym using R HHA provided by pt's daughters with therapist educating them on proper positioning for safety. Discussed limiting conversations while pt is walking to decrease distractions and improve gait mechanics and balance. Therapist demonstrated and educated pt/family on how pt performs stair navigation and how they can safely assist him - pt uses R UE support on R HR ascending with step-to pattern leading with R LE and descends sideways step-to pattern leading with R LE to allow R UE support on HR. Performed 4 steps x3 with therapist and then his 2 daughters providing proper CGA for steadying/safety. Transitioned to stairwell with pt ascending/descending 12 steps with same technique as on the 4 steps but slight increased difficulty due to slight increased stair height - continues to require mod cuing for increased R lateral step length on descent to allow room for L foot on step -  pt's daughters provide proper CGA for steadying with min cuing. Discussed providing pt very short and concise cuing during mobility tasks to allow faster processing and safer execution of a change in his movement (for example when needing him to step R foot out further on the step). Gait  ~140ft to ortho gym with [facility] via pt's daughter without cuing from therapist. Simulated ambulatory car transfer (sedan height) with pt's daughter providing proper R HHA or close supervision when pt using quad cane - pt demonstrates recall of proper sequencing of car transfer. Gait training ~235ft back to room using quad cane with pt's daughter providing CGA for safety and to practice technique. Pt/family report no questions/concerns and report feeling confident assisting pt at home. Pt left seated in chair with his family present and OT soon to be arriving.  Therapy Documentation Precautions:  Precautions Precautions: Fall Precaution Booklet Issued: No Precaution Comments: pt requires verbal cues for WB precautions during session Required Braces or Orthoses: Splint/Cast Cervical Brace: Hard collar (for comfort, pt does not wear often per pt report) Splint/Cast: L thumb spica splint Other Brace: thumb spica splint L hand  Restrictions Weight Bearing Restrictions: Yes LUE Weight Bearing: Non weight bearing Other Position/Activity Restrictions: L rib fxs 1-7  Pain: Session 1: No reports of pain throughout session.  Session 2: No reports of pain throughout session.   Therapy/Group: Individual Therapy  Tawana Scale, PT, DPT, CSRS 08/06/2019, 7:49 AM

## 2019-08-06 NOTE — Plan of Care (Signed)
  Problem: Consults Goal: RH BRAIN INJURY PATIENT EDUCATION Description: Description: See Patient Education module for eduction specifics Outcome: Progressing Goal: Skin Care Protocol Initiated - if Braden Score 18 or less Description: If consults are not indicated, leave blank or document N/A Outcome: Progressing   Problem: RH BOWEL ELIMINATION Goal: RH STG MANAGE BOWEL WITH ASSISTANCE Description: STG Manage Bowel with mod I Assistance. Outcome: Progressing Goal: RH STG MANAGE BOWEL W/MEDICATION W/ASSISTANCE Description: STG Manage Bowel with Medication with mod I Assistance. Outcome: Progressing   Problem: RH BLADDER ELIMINATION Goal: RH STG MANAGE BLADDER WITH EQUIPMENT WITH ASSISTANCE Description: STG Manage Bladder With Equipment With mod I Assistance Outcome: Progressing   Problem: RH SKIN INTEGRITY Goal: RH STG SKIN FREE OF INFECTION/BREAKDOWN Description: Skin will be free of infection/breakdown with mod I assist Outcome: Progressing Goal: RH STG MAINTAIN SKIN INTEGRITY WITH ASSISTANCE Description: STG Maintain Skin Integrity With mod I Assistance. Outcome: Progressing   Problem: RH SAFETY Goal: RH STG ADHERE TO SAFETY PRECAUTIONS W/ASSISTANCE/DEVICE Description: STG Adhere to Safety Precautions With mod I Assistance/Device. Outcome: Progressing   Problem: RH COGNITION-NURSING Goal: RH STG USES MEMORY AIDS/STRATEGIES W/ASSIST TO PROBLEM SOLVE Description: STG Uses Memory Aids/Strategies With mod I Assistance to Problem Solve. Outcome: Progressing   Problem: RH PAIN MANAGEMENT Goal: RH STG PAIN MANAGED AT OR BELOW PT'S PAIN GOAL Description: Pain will be reported less than 4 out of 10 with min assit Outcome: Progressing

## 2019-08-06 NOTE — Progress Notes (Signed)
Pt stated he woke up to go to the bathroom and noticed a pain in his mid back as well as shortness of breath. This nurse administered prn albuterol 86mL. Pt tolerated well, no other concerns to report. Will continue to monitor.  Colan Neptune, LPN

## 2019-08-06 NOTE — Progress Notes (Signed)
Orthopaedic Trauma Progress Note  S: Doing well this morning.  Most of his pain coming from rib fractures, but does note this is improving significantly.  Minimal to no pain in the left hand or clavicle.  Is eager to go home.  Wife at bedside  O:  General: Sitting up in bed, no acute distress Respiratory: No increased work of breathing. Left upper extremity: Removable thumb spica splint in place.  Nontender with palpation over the clavicle, shoulder, throughout the extremity.  Active forward elevation of the shoulders greater than 90 degrees without pain.  Sensation intact throughout extremity.  Neurovascularly intact  Imaging: Reviewed repeat x-rays of left clavicle and left thumb.  Imaging stable, clavicle appears to be healing.   Assessment: 86 year old male status post fall 07/15/2019  Injuries: 1.  Left clavicle fracture 2.  Left fracture/dislocation   Plan:  Weightbearing: NWB LUE Showering: Okay to shower with assistance.  May remove thumb spica splint when showering Orthopedic device(s): Left removable thumb spica splint  Dispo: Repeat imaging has been reviewed.  Patient okay to begin gentle active and passive range of motion of the left shoulder.  Patient okay to come out of thumb spica splint at rest to begin gentle motion of the thumb as he can tolerate. Follow - up plan: 2 to 3 weeks for repeat x-rays of left thumb and left clavicle.  Will reassess weightbearing status at that time.  Contact information:  Katha Hamming MD, Patrecia Pace PA-C   [facility] [phone] (office) [email or web address]

## 2019-08-06 NOTE — Progress Notes (Signed)
Occupational Therapy Session Note  Patient Details  Name: [name] MRN: [id number] Date of Birth: 04/09/1933  Today's Date: 08/06/2019 OT Individual Time: 1324-4010 OT Individual Time Calculation (min): 25 min    Short Term Goals: Week 2:  OT Short Term Goal 1 (Week 2): Pt will groom in standing with CGA to demo improved standing tolerance OT Short Term Goal 2 (Week 2): Pt will wash 10/10 body parts with AE PRN and CGA OT Short Term Goal 3 (Week 2): Pt will doff/don shirt with S and no more than min VC OT Short Term Goal 4 (Week 2): Pt will don footwear with no more than MIN A and MIN VC for AE technique  Skilled Therapeutic Interventions/Progress Updates:   Treatment session with focus on community integration and family education. Pt received seated in w/c with wife present. Provided handout for TBI support group. Engaged in discussion about the assistance pt's daughters plan to provide at home in order to plan for d/c and family education. Engaged in community integration activity, ambulating with walking stick with close supervision outdoors. Pt navigated doorways, uneven surfaces, and tight spaces. Educated pt and wife on disability law allowing pt's wife to enter public bathrooms to assist pt PRN. Provided multimodal cueing for pacing and safety awareness while engaging in functional ambulation. Educated on importance of slowing pace to increase safety awareness on uneven terrain. Ended session with pt seated in w/c with all needs within reach and wife present.    Therapy Documentation Precautions:  Precautions Precautions: Fall Precaution Booklet Issued: No Precaution Comments: pt requires verbal cues for WB precautions during session Required Braces or Orthoses: Splint/Cast Cervical Brace: Hard collar (for comfort, pt does not wear often per pt report) Splint/Cast: L thumb spica splint Other Brace: thumb spica splint L hand  Restrictions Weight Bearing Restrictions:  Yes LUE Weight Bearing: Non weight bearing Other Position/Activity Restrictions: L rib fxs 1-7 Pain: Pain Assessment Pain Scale: 0-10 Pain Score: 2  Pain Location: Shoulder Pain Orientation: Left Pain Descriptors / Indicators: Aching Pain Onset: On-going Patients Stated Pain Goal: 0 Pain Intervention(s): Medication (See eMAR)  Therapy/Group: Individual Therapy  Michelle Nasuti 08/06/2019, 11:56 AM

## 2019-08-06 NOTE — Progress Notes (Signed)
PHYSICAL MEDICINE & REHABILITATION PROGRESS NOTE   Subjective/Complaints: Pt had a reasonable night. Noted blood in his urine on void but no dysuria, odor, cloudiness, etc. Wife notes that his urine can demonstrate blood when he's "dehydrated."  Still feeling a little dizzy at times when up. Received fluid bolus yesterday. Wife also noticed apneic moment/ then gag last night. sats have remained in 90's. Pt reports no sob, cough, wheezing,etc  ROS: Patient denies fever, rash, sore throat, blurred vision, nausea, vomiting, diarrhea, cough, shortness of breath or chest pain, headache, or mood change.   Objective:   DG Clavicle Left  Result Date: 08/04/2019 CLINICAL DATA:  History of recent fall with left clavicular pain, initial encounter EXAM: LEFT CLAVICLE - 2+ VIEWS COMPARISON:  Chest x-ray from 07/14/2019 FINDINGS: Degenerative changes of the acromioclavicular joint are seen. The known distal left clavicular fracture is not well appreciated on today's exam due to positioning. No new fracture is seen. The underlying bony thorax is within limits. IMPRESSION: Degenerative changes at the acromioclavicular joint. The known distal left clavicular fracture is not well appreciated on this study. Electronically Signed   By: Inez Catalina M.D.   On: 08/04/2019 13:26   DG Finger Thumb Left  Result Date: 08/04/2019 CLINICAL DATA:  Recent fall with thumb pain, initial encounter EXAM: LEFT THUMB 2+V COMPARISON:  07/14/2019 FINDINGS: Fracture of the distal aspect of the first proximal phalanx is noted the previously seen avulsion fracture is not as well appreciated on today's exam. No new abnormality is seen. IMPRESSION: Fracture of the distal aspect of the first proximal phalanx. No other focal abnormality is noted. Electronically Signed   By: Inez Catalina M.D.   On: 08/04/2019 13:28   No results for input(s): WBC, HGB, HCT, PLT in the last 72 hours. Recent Labs    08/06/19 0209  NA 138  K 4.1   CL 107  CO2 24  GLUCOSE 93  BUN 28*  CREATININE 1.75*  CALCIUM 8.9    Intake/Output Summary (Last 24 hours) at 08/06/2019 0925 Last data filed at 08/06/2019 0700 Gross per 24 hour  Intake 660 ml  Output --  Net 660 ml     Physical Exam: Vital Signs Blood pressure 131/68, pulse 76, temperature 98.2 F (36.8 C), temperature source Oral, resp. rate 18, height 5\' 8"  (1.727 m), weight 95.3 kg, SpO2 97 %. Constitutional: No distress . Vital signs reviewed. HEENT: EOMI, oral membranes moist Neck: supple Cardiovascular: RRR without murmur. No JVD    Respiratory/Chest: CTA Bilaterally without wheezes or rales. Normal effort    GI/Abdomen: BS +, non-tender, non-distended Ext: no clubbing, cyanosis, or edema Psych: pleasant and cooperative Musculoskeletal:     LUE tender with movement but better. Wrist splint in place.   Neurological: Ox3 with mild ongoing STM deficits, good insight and awareness . RUE 4/5 prox to distal. LUE limited by sling. RLE 3-/5 HF, KE to 4/5 ADF/PF. LLE 2/5 HF, 3/5 KE and 4/5 ADF/PF. Normal sensation--motor and sensory stable  Assessment/Plan: 1. Functional deficits secondary to TBI with polytrauma which require 3+ hours per day of interdisciplinary therapy in a comprehensive inpatient rehab setting.  Physiatrist is providing close team supervision and 24 hour management of active medical problems listed below.  Physiatrist and rehab team continue to assess barriers to discharge/monitor patient progress toward functional and medical goals  Care Tool:  Bathing    Body parts bathed by patient: Left arm, Chest, Abdomen, Front perineal area, Right upper leg,  Left upper leg, Face, Buttocks, Right arm, Right lower leg, Left lower leg   Body parts bathed by helper: Right arm, Right lower leg, Left lower leg     Bathing assist Assist Level: Contact Guard/Touching assist     Upper Body Dressing/Undressing Upper body dressing   What is the patient wearing?: Pull  over shirt    Upper body assist Assist Level: Supervision/Verbal cueing    Lower Body Dressing/Undressing Lower body dressing      What is the patient wearing?: Pants, Underwear/pull up     Lower body assist Assist for lower body dressing: Minimal Assistance - Patient > 75%     Toileting Toileting    Toileting assist Assist for toileting: Contact Guard/Touching assist     Transfers Chair/bed transfer  Transfers assist     Chair/bed transfer assist level: Supervision/Verbal cueing Chair/bed transfer assistive device: Other (none)   Locomotion Ambulation   Ambulation assist   Ambulation activity did not occur: Safety/medical concerns  Assist level: Supervision/Verbal cueing Assistive device: Other (comment) (walking stick) Max distance: >150 ft   Walk 10 feet activity   Assist  Walk 10 feet activity did not occur: Safety/medical concerns  Assist level: Supervision/Verbal cueing Assistive device: No Device   Walk 50 feet activity   Assist Walk 50 feet with 2 turns activity did not occur: Safety/medical concerns  Assist level: Supervision/Verbal cueing Assistive device: No Device    Walk 150 feet activity   Assist Walk 150 feet activity did not occur: Safety/medical concerns  Assist level: Supervision/Verbal cueing Assistive device: No Device    Walk 10 feet on uneven surface  activity   Assist Walk 10 feet on uneven surfaces activity did not occur: Safety/medical concerns   Assist level: Minimal Assistance - Patient > 75% Assistive device: Cane-quad, Other (comment)   Wheelchair     Assist Will patient use wheelchair at discharge?: No (patient is a functional ambulator) Type of Wheelchair: Manual Wheelchair activity did not occur: N/A  Wheelchair assist level: Supervision/Verbal cueing Max wheelchair distance: 150 ft    Wheelchair 50 feet with 2 turns activity    Assist    Wheelchair 50 feet with 2 turns activity did not  occur: N/A   Assist Level: Supervision/Verbal cueing   Wheelchair 150 feet activity     Assist  Wheelchair 150 feet activity did not occur: N/A   Assist Level: Supervision/Verbal cueing   Blood pressure 131/68, pulse 76, temperature 98.2 F (36.8 C), temperature source Oral, resp. rate 18, height 5\' 8"  (1.727 m), weight 95.3 kg, SpO2 97 %.  Medical Problem List and Plan: 1.  Impaired secondary to TBI             -patient may shower             -ELOS/Goals: S with PT, OT, and SLP--ELOS 7/2 (date moved up)  --Continue CIR therapies today including PT, OT, and SLP  2.  Antithrombotics: -DVT/anticoagulation:  Pharmaceutical: Lovenox. Not ambulating.             -antiplatelet therapy:  3. Pain Management:   tylenol scheduled at 650 mg qid.    -Ultram prn for moderate to severe pain (was taking at home)- providing better pain relief.    -Thoracic and Lumbar XR ordered 6/22. Continue heating pads.   6/29 pain fairly well controlled-limit tramadol d/t nausea, ?dizziness, utilize tylenol when possible as well as ice and heat.   7/1: pain currently well controlled. decreased Tylenol to  TID   4. Mood: LCSW to follow for evaluation and support.              -antipsychotic agents: N/a  -wife appears very supportive 5. Neuropsych: This patient is capable of making decisions on his own behalf.  -cognition demonstrating substantial improvement  6/28-29 sleeping better. Mild HS confusion likely d/t 1900 amitriptyline but he's sleeping well and very alert in AM--> continue   -continue melatonin 3mg  qhs   -on lexapro daily as home med 6. Skin/Wound Care: Routine pressure relief measures.  7. Fluids/Electrolytes/Nutrition: encourage PO. 8. HTN: Monitor BP tid--continue cardura. Lotrel on hold.   6/24: well controlled. Less orthostatic in therapy with TEDs and abdominal binder.   7/1 bp's controlled 9. Left thumb dislocation: s/p relocation. Continue spica splint 10. Distal clavicle fracture-  XR reviewed and discussed with patient and his wife. No new fractures. Order in system to d/c sling and initiate ROM exercises: discussed with patient and his wife.  11. New onset diabetes: Hgb A1c-6.5--Carb modified diet. Will monitor BS ac/hs for now.  12. Acute urinary retention: cardura and urecholine  6/29 now voiding frequently. Urecholine already decreased yesterday  7/1 off urecholine 13.  ABLA:H/H on downward trend       -hgb up to 8.6 6/28, may be concentration component, still no signs of bleeding 14. Acute renal failure on chronic renal failure: SCr 1.97 at [place]:    -BUN/Cr down to 22/1.39 today  -BUN/Cr sl up to 26/1.56 6/28---encourage appropraite fluids   6/30-7/1 up trending BUN/Cr   -nephrology consult as outpt   -pt/wife feel that he's dry   -I's/O's still negative from admit   -will give 1/2ns IV at 75cc/hr today, recheck bmet tomorrow morning 15. Constipation: Patient reporting hard balls yesterday. Stool now loose 16. HLD: 6/23: Pharmacy discussed  that patient was on Zetia and Lipitor at home and asked if we should restart this.    -lipid panel shows mildly elevated LDL to 115. Otherwise normal.   -will not resume while in hospital, diet ed     LOS: 14 days A FACE TO [place] 08/06/2019, 9:25 AM

## 2019-08-06 NOTE — Progress Notes (Signed)
Speech Language Pathology Discharge Summary  Patient Details  Name: [name] MRN: [id number] Date of Birth: [birth year]  Today's Date: 08/06/2019 SLP Individual Time: 1400-1425 SLP Individual Time Calculation (min): 25 min   Skilled Therapeutic Interventions:  Skilled treatment session focused on completion of of family education the patient, his wife and 2 daughters. SLP facilitated session by providing education regarding patient's current cognitive functioning and strategies to utilize at home to maximize recall, problem solving and overall safety. SLP also provided education regarding activities he can participate in safely at home. All verbalized understanding and handouts were also given. Patient left upright in the chair with family present. Continue with current plan of care.   Patient has met 1 of 3 long term goals.  Patient to discharge at overall Supervision level.   Reasons goals not met: Patient requires overall supervision level verbal cues for recall and complex problem solving.   Clinical Impression/Discharge Summary: Patient has made functional gains and has met 1 of 3 LTGs this admission. Currently, patient demonstrates behaviors consistent with a Rancho Level VIII and requires overall supervision level verbal cues to complete functional and mildly complex tasks safely in regards to problem solving and recall. Patient's anomia has resolved and patient is overall Mod I for word-finding at the conversation level. Patient and family education is complete and patient will discharge home with 24 hour supervision from family. Patient would benefit from f/u SLP services to maximize his cognitive functioning and overall functional independence in order to reduce caregiver burden.   Care Partner:  Caregiver Able to Provide Assistance: Yes  Type of Caregiver Assistance: Physical  Recommendation:  Outpatient SLP;24 hour supervision/assistance  Rationale for SLP Follow Up:  Maximize cognitive function and independence;Maximize swallowing safety;Reduce caregiver burden   Equipment: N/A   Reasons for discharge: Discharged from hospital   Patient/Family Agrees with Progress Made and Goals Achieved: Yes    [place], [place] 08/06/2019, 6:47 AM

## 2019-08-06 NOTE — Progress Notes (Signed)
Occupational Therapy Discharge Summary  Patient Details  Name: [name] MRN: [id number] Date of Birth: January 08, 1934  Today's Date: 08/06/2019 OT Individual Time: 1430-1525 OT Individual Time Calculation (min): 70 min    Family present for family education Wife and daughters (part of session). Pt with no pain reported. Pt educated on new gentle AROM/PROM of thumb and shoulder orders and OT provides/demos the following therex for family with demo of how to provide AAROM in pain free ROM: Wife able to return demo with no questions.  Access Code: [email or web address] URL: [email or web address] Date: 08/06/2019 Prepared by: Mariane Masters  Exercises Seated Single Arm Shoulder Flexion AROM - 1 x daily - 7 x weekly - 3 sets - 10 reps Seated Shoulder Abduction - Thumbs Up - 1 x daily - 7 x weekly - 3 sets - 10 reps Seated Shoulder External Rotation - 1 x daily - 7 x weekly - 3 sets - 10 reps Seated Shoulder Horizontal Abduction - Thumbs Up - 1 x daily - 7 x weekly - 3 sets - 10 reps Thumb Opposition - 1 x daily - 7 x weekly - 3 sets - 10 reps Seated Thumb Circumduction - 1 x daily - 7 x weekly - 3 sets - 10 reps Thumb MP Flexion Extension - 1 x daily - 7 x weekly - 3 sets - 10 reps Circular Shoulder Pendulum with Table Support - 1 x daily - 7 x weekly - 3 sets - 10 reps Flexion-Extension Shoulder Pendulum with Table Support - 1 x daily - 7 x weekly - 3 sets - 10 reps Horizontal Shoulder Pendulum with Table Support - 1 x daily - 7 x weekly - 3 sets - 10 reps  Pt and wife educated on Energy conservation strategies with examples of implementation for Adl and IADL task. Verbalized understanding. Pt wife feels comfortable completing ADLs at home and hand held transfers (demonstrating proficiency). Exited session with pt seated in bed, exit alarm on and call light in reach   Patient has met 10 of 10 long term goals due to improved activity tolerance, improved balance, postural control,  ability to compensate for deficits, improved attention, improved awareness and improved coordination.  Patient to discharge at overall Supervision -MIN A level.  Patient's care partner is independent to provide the necessary physical and cognitive assistance at discharge.    Reasons goals not met: n/a  Recommendation:  Patient will benefit from ongoing skilled OT services in outpatient setting to continue to advance functional skills in the area of BADL, iADL, School/Education and Vocation.  Equipment: BSC and TTB  Reasons for discharge: treatment goals met and discharge from hospital  Patient/family agrees with progress made and goals achieved: Yes  OT Discharge Precautions/Restrictions  Precautions Precaution Booklet Issued: No Precaution Comments: pt requires verbal cues for WB precautions during session Required Braces or Orthoses: Splint/Cast Splint/Cast: L thumb spica splint Restrictions Weight Bearing Restrictions: Yes LUE Weight Bearing: Non weight bearing Other Position/Activity Restrictions: L rib fxs 1-7 General   Vital Signs   Pain   ADL ADL Grooming: Minimal assistance Where Assessed-Grooming: Sitting at sink, Wheelchair Upper Body Bathing: Moderate assistance Where Assessed-Upper Body Bathing: Sitting at sink, Wheelchair Lower Body Bathing: Maximal assistance Where Assessed-Lower Body Bathing: Standing at sink, Sitting at sink Upper Body Dressing: Maximal assistance Where Assessed-Upper Body Dressing: Sitting at sink Lower Body Dressing: Maximal assistance Where Assessed-Lower Body Dressing: Sitting at sink, Standing at sink Vision Baseline Vision/History: Wears glasses Wears Glasses: Reading only Patient  Visual Report: No change from baseline Vision Assessment?: No apparent visual deficits Perception  Perception: Within Functional Limits Praxis Praxis: Intact Cognition Overall Cognitive Status: Impaired/Different from baseline Arousal/Alertness:  Awake/alert Attention: Sustained Sustained Attention: Appears intact Sustained Attention Impairment: Verbal complex Memory: Impaired Memory Impairment: Decreased short term memory;Decreased recall of new information Decreased Short Term Memory: Functional complex Awareness: Appears intact Problem Solving: Impaired Executive Function: Writer: Impaired Organizing Impairment: Functional complex Safety/Judgment: Appears intact [place] [employer] Scales of Cognitive Functioning: Purposeful/appropriate Sensation Sensation Light Touch: Appears Intact Proprioception: Appears Intact Coordination Gross Motor Movements are Fluid and Coordinated: No Fine Motor Movements are Fluid and Coordinated: No Coordination and Movement Description: L UE NWB, decreased postural control and balance strategies, generalized deconditioning Motor  Motor Motor: Other (comment) Motor - Skilled Clinical Observations: limited by pain. mild coordination deficits in the LLE Motor - Discharge Observations: Pain improving, generalized deconditioning, and continued mild coordination deficits in the LLE, improved since admission Mobility  Bed Mobility Rolling Right: Supervision/verbal cueing Rolling Left: Supervision/Verbal cueing Supine to Sit: Supervision/Verbal cueing Sit to Supine: Supervision/Verbal cueing Transfers Sit to Stand: Supervision/Verbal cueing  Trunk/Postural Assessment  Cervical Assessment Cervical Assessment: Exceptions to [facility] Thoracic Assessment Thoracic Assessment: Exceptions to [facility] Lumbar Assessment Lumbar Assessment: Exceptions to [facility] Postural Control Postural Control: Deficits on evaluation  Balance Balance Balance Assessed: Yes Static Sitting Balance Static Sitting - Balance Support: No upper extremity supported Static Sitting - Level of Assistance: 6: Modified independent (Device/Increase time) Dynamic Sitting Balance Dynamic Sitting - Balance Support: No upper  extremity supported Dynamic Sitting - Level of Assistance: 5: Stand by assistance Static Standing Balance Static Standing - Level of Assistance: 5: Stand by assistance Dynamic Standing Balance Dynamic Standing - Level of Assistance: 5: Stand by assistance;4: Min assist Extremity/Trunk Assessment RUE Assessment RUE Assessment: Within Functional Limits LUE Assessment LUE Assessment: Exceptions to [facility] Active Range of Motion (AROM) Comments: thumb fx; sling no longer needed   [name] 08/06/2019, 1:24 PM

## 2019-08-06 NOTE — Progress Notes (Signed)
Patient ID: [name], male   DOB: 08/24/1933, 86 y.o.   MRN: [id number]  Met with patient and family during OT family education session. Observed OT teaching ROM exercises to Left arm, shoulder, and thumb. Equipment has been delivered. Spouse questioned about the bandage to his left elbow and his left thumb. This RN said she would speak to his nurse and have her check the bandages and if necessary remove them or clean the areas and put new dressings on. This RN explained how the day of discharge goes and recommended that the patient take pain medication before being discharged. All questions answered with verbal understanding.  This RN spoke with the patient's nurse and asked her to check the left elbow and the left thumb. She stated that she already had planned to do it, she was just waiting on family education to be completed.  Dorthula Nettles, RN, BSN, [place] [facility] [phone] Cell [phone]

## 2019-08-07 ENCOUNTER — Encounter (HOSPITAL_COMMUNITY): Payer: PPO | Admitting: Psychology

## 2019-08-07 LAB — BASIC METABOLIC PANEL
Anion gap: 9 (ref 5–15)
BUN: 24 mg/dL — ABNORMAL HIGH (ref 8–23)
CO2: 22 mmol/L (ref 22–32)
Calcium: 9 mg/dL (ref 8.9–10.3)
Chloride: 105 mmol/L (ref 98–111)
Creatinine, Ser: 1.51 mg/dL — ABNORMAL HIGH (ref 0.61–1.24)
GFR calc Af Amer: 48 mL/min — ABNORMAL LOW (ref >60)
GFR calc non Af Amer: 42 mL/min — ABNORMAL LOW (ref >60)
Glucose, Bld: 101 mg/dL — ABNORMAL HIGH (ref 70–99)
Potassium: 4.1 mmol/L (ref 3.5–5.1)
Sodium: 136 mmol/L (ref 135–145)

## 2019-08-07 NOTE — Progress Notes (Signed)
Inpatient Rehabilitation Care Coordinator  Discharge Note  The overall goal for the admission was met for:   Discharge location: Yes. D/c to home with wife to provide 24/7 care.   Length of Stay: Yes. 14 days.  Discharge activity level: Yes. Supervision for PT. Mod to Max for OT self care needs.   Home/community participation: Yes  Services provided included: MD, RD, PT, OT, SLP, RN, CM, TR, Pharmacy, Neuropsych and SW  Financial Services: Private Insurance: [employer]  Follow-up services arranged: Outpatient: [facility] for PT/OT/ST and DME: [employer] for quad cane, TTB, adn 3in1 BSC  Neuro OT eval on 08/13/19 at 12:30pm; Neuro PT eval on 08/13/19 at 1:15pm; Neuro ST eval on 7/13 at 11:45am   Comments (or additional information): contact pt wife [name] [phone]  Patient/Family verbalized understanding of follow-up arrangements: Yes  Individual responsible for coordination of the follow-up plan: Pt to have assistance with coordinating care needs.   Confirmed correct DME delivered: Rana Snare 08/07/2019    Rana Snare

## 2019-08-07 NOTE — Progress Notes (Signed)
Patient is scheduled for discharge today. Given discharge instructions by Irving Burton. All equipment and belonging have been packed with patient. No complaints or concerns to report. Amanda Cockayne, LPN

## 2019-08-07 NOTE — Consult Note (Signed)
Neuropsychological Consultation   Patient:   [name]   DOB:   Oct 09, 1933  MR Number:  [id number]  Location:  [facility] [id number] [place] [address] Dept: [place]: [phone]           Date of Service:   08/07/2019  Start Time:   8 AM End Time:   9 AM  Provider/Observer:  Ilean Skill, Psy.D.       Clinical Neuropsychologist       Billing Code/Service: [phone]  Chief Complaint:    Stephone Gum is an 86 year old male with history of BPH, CAD, HTN, CKD, prediabetes, mild cognitive impairments, prior falls.  The patient was admitted on 07/14/2019 after fall downstairs that was unwitnessed.  Patient had retrograde amnesia of events as well as anterograde amnesia.  Patient reported headaches, back and chest pain.  CT of head showed small volume subarachnoid hemorrhage and left scalp contusion, trace left PTX with left first through seventh rib fractures, mild LUL contusion, clavicle fracture and other orthopedic injuries.  Conservative care was advised and repeat CT if neurological decline was noted.  Patient had issues with confusion that has been resolving with time.  The confusion was worse at night due to sundowning-like issues and concerns.  The patient showed progressive improvements in overall cognitive functioning and both the patient and his wife now report that he is returned to baseline cognitively.  Reason for Service:  Patient was referred for neuropsychological consultation due to concerns about cognition particular around issues of memory and decision-making.  Below is the HPI for the current admission.  HPI: [name] is an 86 year old male with history of BPH, CAD, HTN, CKD III, prediabetes, mild cognitive impairment, prior falls who was admitted on 07/14/19 after unwitnessed fall down stairs.  He had amnesia of events and reports of headache, back and  chest pain.  CT of the head showed small volume SAH on left scalp contusion, trace left PTX with left 1st-7th rib fractures, mild LUL contusion, 3.1 cm AAA, distal clavicle fracture, dislocation of left interphalangeal joint fourth digit and possible avulsion fracture of MCP joint with advanced degenerative changes.  Dr. Cyndy Freeze consulted for input and recommended conservative management with repeat head CT if neurological decline noted.  Cervical spine CT showed mild multilevel DDD.  CT of lumbar spine showed contusion left flank and paraspinal musculature with multilevel DDD and mild to moderate canal stenosis L3/L4.  Ortho recommended sling with immobilization LUE and to follow-up with Dr. Doreatha Martin in 2 weeks.  Left thumb was closed reduced and splinted--to be NWB LUE.  Patient has had issues with pain as well as confusion with is resolving. Acute on chronic renal failure with rise in SCr to 2.2 treated with IVF and lotrel d/c.  Foley in place due to urinary retention but cardura has been held intermittently. He was started on urecholine on 6/14 .   Confusion at nights due to sundowning improving with wife staying to provide support and reorientation. Pain control has greatly improved and he is showing improvement in activity tolerance with therapy. CIR recommended due to functional decline.  Current Status:  Upon entering the room, the patient was sitting upright and was well oriented.  The patient was oriented x4 with bright affect and alert.  The patient's wife was also present in the room.  Reviewed standard mental status questions with the patient being correct on  all of them.  The patient's wife reports that he appears to have completely returned to baseline with regard to memory and cognition.  The patient was clearly able to describe in detail what had been going on with therapeutic interventions.  While the patient reiterated issues with retrograde and anterograde amnesia around the fall event itself.   He reports that he has had some recall of individual aspects of the fall or post fall but has very little or no memory of the entire event.  However, the patient reports that his learning and memory for new events now is within normal limits and back to baseline and he is not having any significant worsening of status in the evening.  The patient speaks multiple languages and was able to demonstrate his continued excellent expressive language abilities and showed good receptive abilities.  The patient showed good executive functioning, social judgment, understanding of situations and receptive language abilities.  There not appear to be any issues with competency and the patient does appear to return back to baseline.  Behavioral Observation: [name]  presents as a 86 y.o.-year-old Right Caucasian Male who appeared his stated age. his dress was Appropriate and he was Well Groomed and his manners were Appropriate to the situation.  his participation was indicative of Appropriate and Attentive behaviors.  There were  physical disabilities noted.  he displayed an appropriate level of cooperation and motivation.     Interactions:    Active Appropriate and Attentive  Attention:   within normal limits and attention span and concentration were age appropriate  Memory:   within normal limits; recent and remote memory intact  Visuo-spatial:  not examined  Speech (Volume):  normal  Speech:   normal; normal  Thought Process:  Coherent and Relevant  Though Content:  WNL; not suicidal and not homicidal  Orientation:   person, place, time/date and situation  Judgment:   Good  Planning:   Fair  Affect:    Appropriate  Mood:    Euthymic  Insight:   Good  Intelligence:   high  Medical History:   Past Medical History:  Diagnosis Date  . Allergy   . BPH (benign prostatic hyperplasia)   . BPH (benign prostatic hypertrophy)   . CAD (coronary artery disease)    s/p cypher DES to pLAD  6/08; normal LVF;  ETT-Myoview 2009: no ischemia   . Coronary artery disease   . Eosinophilia   . High cholesterol   . HTN (hypertension)   . Hyperlipidemia   . Hypertension   . MI (myocardial infarction) ([place])   . Myocardial infarction ([place])   . Prediabetes   . Trigeminal neuralgia     Psychiatric History:  No prior psychiatric history  Family Med/Psych History:  Family History  Problem Relation Age of Onset  . Stomach cancer Mother   . Heart disease Father   . Heart disease Brother   . Aortic aneurysm Brother   . Colon cancer Brother   . Coronary artery disease Father 92  . Coronary artery disease Brother   . Aortic aneurysm Brother   . Colon cancer Brother   . Esophageal cancer Neg Hx   . Rectal cancer Neg Hx   . Arthritis Daughter        rheumatoid    Impression/DX:  Alpha Mysliwiec is an 86 year old male with history of BPH, CAD, HTN, CKD, prediabetes, mild cognitive impairments, prior falls.  The patient was admitted on 07/14/2019 after fall downstairs that  was unwitnessed.  Patient had retrograde amnesia of events as well as anterograde amnesia.  Patient reported headaches, back and chest pain.  CT of head showed small volume subarachnoid hemorrhage and left scalp contusion, trace left PTX with left first through seventh rib fractures, mild LUL contusion, clavicle fracture and other orthopedic injuries.  Conservative care was advised and repeat CT if neurological decline was noted.  Patient had issues with confusion that has been resolving with time.  The confusion was worse at night due to sundowning-like issues and concerns.  The patient showed progressive improvements in overall cognitive functioning and both the patient and his wife now report that he is returned to baseline cognitively.  Upon entering the room, the patient was sitting upright and was well oriented.  The patient was oriented x4 with bright affect and alert.  The patient's wife was also present in the  room.  Reviewed standard mental status questions with the patient being correct on all of them.  The patient's wife reports that he appears to have completely returned to baseline with regard to memory and cognition.  The patient was clearly able to describe in detail what had been going on with therapeutic interventions.  While the patient reiterated issues with retrograde and anterograde amnesia around the fall event itself.  He reports that he has had some recall of individual aspects of the fall or post fall but has very little or no memory of the entire event.  However, the patient reports that his learning and memory for new events now is within normal limits and back to baseline and he is not having any significant worsening of status in the evening.  The patient speaks multiple languages and was able to demonstrate his continued excellent expressive language abilities and showed good receptive abilities.  The patient showed good executive functioning, social judgment, understanding of situations and receptive language abilities.  There not appear to be any issues with competency and the patient does appear to return back to baseline.  Disposition/Plan:  Patient is being discharged home today.  Diagnosis:    Traumatic brain injury, without loss of consciousness, sequela ([place]) - Plan: Ambulatory referral to Physical Medicine Rehab  Back pain - Plan: DG Thoracic Spine 2 View, DG Thoracic Spine 2 View, CANCELED: DG Thoracic Spine 4V  Fracture  Traumatic brain injury, without loss of consciousness, initial encounter [facility]) - Plan: Ambulatory referral to Physical Medicine Rehab  Traumatic brain injury, without loss of consciousness, subsequent encounter         Electronically Signed   _______________________ Ilean Skill, Psy.D.

## 2019-08-07 NOTE — Progress Notes (Signed)
Recreational Therapy Discharge Summary Patient Details  Name: ESKER DEVER MRN: [id number] Date of Birth: [birth year] Today's Date: 08/07/2019  Long term goals set: 1  Long term goals met: 1  Comments on progress toward goals: Pt has made great progress during LOS and is discharging home today with wife & family to provide 24 hour supervision/assistance.  TR sessions focused on activity analysis identifying potential modifications and community reintegration.  Pt participated in simulated community outing ambulatory level with contact guard assist.  Goal met.  Pt is anxious to return home and return to previously enjoyed activities.  Reasons goals not met: n/a  Equipment acquired: n/a  Reasons for discharge: discharge from hospital  Patient/family agrees with progress made and goals achieved: Yes  Sahiba Granholm 08/07/2019, 10:48 AM

## 2019-08-09 NOTE — Discharge Summary (Signed)
Physician Discharge Summary  Patient ID: CARMINO OCAIN MRN: [id number] DOB/AGE: [birth year] 86 y.o.  Admit date: 07/23/2019 Discharge date: 08/07/2019  Discharge Diagnoses:  Principal Problem:   TBI (traumatic brain injury) ([place]) Active Problems:   Orthostatic hypotension   Type 2 diabetes mellitus (HCC)   Stage 3b chronic kidney disease   Acute blood loss anemia   Fracture of multiple ribs with pain   Clavicle fracture   Closed displaced fracture of phalanx of left thumb, sequela   Discharged Condition: stable   Significant Diagnostic Studies:  DG Thoracic Spine 2 View  Result Date: 07/28/2019 CLINICAL DATA:  Fall with continued pain EXAM: THORACIC SPINE 2 VIEWS COMPARISON:  07/14/2019 FINDINGS: Thoracic alignment is within normal limits. Vertebral body heights are grossly maintained. Diffuse degenerative osteophytes of the mid to lower thoracic spine. IMPRESSION: Diffuse degenerative changes. No definite acute osseous abnormality. Electronically Signed   By: Donavan Foil M.D.   On: 07/28/2019 18:32   DG Lumbar Spine Complete  Result Date: 07/28/2019 CLINICAL DATA:  [place] 8 days ago multiple broken ribs, broken collarbone continued back pain EXAM: LUMBAR SPINE - COMPLETE 4+ VIEW COMPARISON:  CT 07/14/2019 FINDINGS: Five non rib-bearing lumbar type vertebra. Alignment is within normal limits. Vertebral body heights are grossly maintained. Mild diffuse degenerative changes with disc space narrowing and mild osteophytes. Facet degenerative change of the lower lumbar spine. IMPRESSION: Degenerative changes. No definite acute osseous abnormality. Electronically Signed   By: Donavan Foil M.D.   On: 07/28/2019 18:31   DG Clavicle Left  Result Date: 08/04/2019 CLINICAL DATA:  History of recent fall with left clavicular pain, initial encounter EXAM: LEFT CLAVICLE - 2+ VIEWS COMPARISON:  Chest x-ray from 07/14/2019 FINDINGS: Degenerative changes of the acromioclavicular joint are seen. The  known distal left clavicular fracture is not well appreciated on today's exam due to positioning. No new fracture is seen. The underlying bony thorax is within limits. IMPRESSION: Degenerative changes at the acromioclavicular joint. The known distal left clavicular fracture is not well appreciated on this study. Electronically Signed   By: Inez Catalina M.D.   On: 08/04/2019 13:26    DG Finger Thumb Left  Result Date: 08/04/2019 CLINICAL DATA:  Recent fall with thumb pain, initial encounter EXAM: LEFT THUMB 2+V COMPARISON:  07/14/2019 FINDINGS: Fracture of the distal aspect of the first proximal phalanx is noted the previously seen avulsion fracture is not as well appreciated on today's exam. No new abnormality is seen. IMPRESSION: Fracture of the distal aspect of the first proximal phalanx. No other focal abnormality is noted. Electronically Signed   By: Inez Catalina M.D.   On: 08/04/2019 13:28    Labs:  Basic Metabolic Panel: Recent Labs  Lab 08/03/19 0648 08/06/19 0209 08/07/19 0608  NA 138 138 136  K 4.6 4.1 4.1  CL 103 107 105  CO2 25 24 22   GLUCOSE 97 93 101*  BUN 26* 28* 24*  CREATININE 1.56* 1.75* 1.51*  CALCIUM 9.0 8.9 9.0    CBC: CBC Latest Ref Rng & Units 08/03/2019 07/27/2019 07/25/2019  WBC 4.0 - 10.5 K/uL 6.3 9.0 8.1  Hemoglobin 13.0 - 17.0 g/dL 8.6(L) 7.5(L) 7.4(L)  Hematocrit 39 - 52 % 27.6(L) 23.1(L) 23.0(L)  Platelets 150 - 400 K/uL 463(H) 437(H) 408(H)    CBG: No results for input(s): GLUCAP in the last 168 hours.  Brief HPI:   [name] is a 86 y.o. male with history of BPH, CAD, HTN, CKD III, prediabetes; who  was admitted on 07/14/19 after unwitnessed fall down some stairs. He had amnesia for events and reported headache, as well as back and chest pain.  CT of head showed small volume SAH on left scalp with contusion, trace left PTX with left 1st-7th rib fractures, 3.1 cm AAA, distal clavicle fracture, dislocation of left interphalangeal joint fourth  digit and possible avulsion fracture of MCP joint with advanced degenerative changes.  Dr. Cyndy Freeze neurosurgery recommended conservative care and repeat CT head if neurological decline noted.  CT of cervical spine done showing multilevel DVTs.  CT lumbar spine showed contusion left flank and paraspinal musculature with multilevel DDD and mild to moderate canal stenosis L3/L4.  Ortho recommended sling for immobilization of LUE and left thumb was closed reduced and splinted.  He is to be NWB LUE in sling for support.  Patient has had issues with pain as well as confusion which had resolved.  Acute on chronic renal failure with rise in creatinine to 2.2 was improving with IV fluids.  Foley was placed due to urinary retention and he was started on Urecholine on 6/14.  He had issues with sundowning and his wife was staying due to safety concerns and to provide support as well as reorientation through the night.  His pain control had greatly improved and he was showing improvement in activity tolerance. CIR was recommended due to functional decline.   Hospital Course: [name] was admitted to rehab 07/23/2019 for inpatient therapies to consist of PT, ST and OT at least three hours five days a week. Past admission physiatrist, therapy team and rehab RN have worked together to provide customized collaborative inpatient rehab. Sleep wake disruption has resolved with steady improvement mentation and activity tolerance. Acute on chronic renal failure has been improving with improvement in po intake.  He has history of pre-diabetes but labs showed Hgb A1C- 6.5 with onset of DM and was placed on CM diet. BS were monitored on achs basis and BS were well controlled on diet alone therefore CBGs were d/c on 06/22. Due to anemia, stool guaiac X 1 checked and was negative for occult blood .   Serial CBC  showed ABLA which is slowly resolving. With increase in activity, he reported lower back and thoracic pain therefore  X rays done which were negative for injury or fracture. Dr. Sima Matas evaluated patient towards the end of his stay and felt that no cognitive deficits or concerns of competency noted. Tylenol was scheduled qid to help manage pain in addition to prn use of ultram as well as lidocaine patches for rib pain. His blood pressures were monitored on TID basis were reasonably controlled off Lotrel. He was maintained on Cardura and urecholine due to urinary retention.  Abdominal binder and TEDs were ordered to help support BP. He did have increase in orthostatic changes with hypotension and dizziness on 06/30. He was treated with fluid bolus due to concerns of acute on chronic renal failure and no further episodes reported.   Acute urinary retention has resolved and urecholine has been weaned off due to frequency. Follow up Xrays done on distal clavicle showing healing fracture. Miralax was added for bowel program and to help manage constipation. Ortho evaluated films and recommended d/c of sling as well as ROM as tolerated. Left thumb fracture stable and to begin gentle ROM of thumb  but to continue NWB LUE.  He has made great progress during his stay and wife has provided support and has been present during  his stay. He will continue to receive follow up outpatient PT, OT and ST at [facility] after discharge.    Rehab course: During patient's stay in rehab weekly team conferences were held to monitor patient's progress, set goals and discuss barriers to discharge. At admission, patient required max assist with ADL tasks and with mobility. He exhibited behaviors consistent with RLAS VII and required in verbal cues to due to [facility] deficits, had mild word finding deficits and needed verbal cues to complete functional and familiar tasks.  He has had improvement in activity tolerance, balance, postural control as well as ability to compensate for deficits.  He requires mod assist for UB bathing and max assist for LB  bathing and dressing tasks. He requires supervision with verbal cues for transfer and to ambulate 150' with [facility]. He requires CGA to climb 12 stairs. He requires supervision for problem solving mildly complex tasks and for recall. His anomia has resolved and able to compensate for word finding deficits. an Family education was completed regarding all aspects of safety and care.   Discharge disposition: 01-Home or Self Care  Diet: Carb modified.   Special Instructions: 1. No weight on LUE and continue to wear left thumb spica splint.  2. ROM of left shoulder as tolerated.   Discharge Instructions    Ambulatory Referral to Neuro Rehab   Complete by: As directed    Eval and treat physical occupational therapy and speech therapy   Ambulatory referral to Physical Medicine Rehab   Complete by: As directed    Moderate complexity follow-up 1 to 2 weeks TBI   Ambulatory referral to Urology   Complete by: As directed    Ambulatory referral for painless hematuria with history of BPH     Allergies as of 08/07/2019      Reactions   Gabapentin    Visual changes      Medication List    STOP taking these medications   amLODipine-benazepril 5-20 MG capsule Commonly known as: LOTREL   aspirin EC 81 MG tablet   Calcarb 600/D 600-400 MG-UNIT tablet Generic drug: Calcium Carbonate-Vitamin D   diphenhydrAMINE 25 MG tablet Commonly known as: BENADRYL   gabapentin 300 MG capsule Commonly known as: NEURONTIN     TAKE these medications   albuterol 108 (90 Base) MCG/ACT inhaler Commonly known as: ProAir HFA INHALE 2 PUFFS INTO THE LUNGS EVERY 6 HOURS AS NEEDED FOR WHEEZING OR SHORTNESS OF BREATH   amitriptyline 10 MG tablet Commonly known as: ELAVIL Take 1 tablet (10 mg total) by mouth at bedtime.   atorvastatin 40 MG tablet Commonly known as: LIPITOR Take 40 mg by mouth daily.   Calcium-Vitamin D 600-200 MG-UNIT tablet Take 1 tablet by mouth 2 (two) times daily.   Coenzyme Q-10 100  MG capsule Take 100 mg by mouth daily.   doxazosin 2 MG tablet Commonly known as: CARDURA Take 1 tablet (2 mg total) by mouth daily. What changed: Another medication with the same name was removed. Continue taking this medication, and follow the directions you see here.   escitalopram 10 MG tablet Commonly known as: LEXAPRO TAKE 1 TABLET BY MOUTH EVERY DAY What changed: Another medication with the same name was removed. Continue taking this medication, and follow the directions you see here.   ezetimibe 10 MG tablet Commonly known as: ZETIA Take 1 tablet (10 mg total) by mouth daily. Please make yearly appt with Dr. [name] for October for future refills. 1st attempt What  changed: additional instructions   fluticasone 50 MCG/ACT nasal spray Commonly known as: FLONASE USE 2 SPRAYS INTO BOTH NOSTRILS DAILY. What changed: Another medication with the same name was removed. Continue taking this medication, and follow the directions you see here.   fluticasone furoate-vilanterol 200-25 MCG/INH Aepb Commonly known as: BREO ELLIPTA Inhale 1 puff into the lungs daily.   lidocaine 5 % Commonly known as: LIDODERM Place 1 patch onto the skin daily. Remove & Discard patch within 12 hours or as directed by MD   melatonin 3 MG Tabs tablet Take 1 tablet (3 mg total) by mouth at bedtime.   methocarbamol 500 MG tablet Commonly known as: ROBAXIN Take 1 tablet (500 mg total) by mouth every 6 (six) hours as needed for muscle spasms.   multivitamin with minerals Tabs tablet Take 1 tablet by mouth daily.   NON FORMULARY Take 1 tablet by mouth 2 (two) times daily. Omega Q 1 tab twice daily   polyethylene glycol 17 g packet Commonly known as: MIRALAX / GLYCOLAX Take 17 g by mouth daily.   traMADol 50 MG tablet Commonly known as: ULTRAM Take 1 tablet (50 mg total) by mouth every 6 (six) hours as needed. What changed: Another medication with the same name was removed. Continue taking this  medication, and follow the directions you see here.   Wixela Inhub 250-50 MCG/DOSE Aepb Generic drug: Fluticasone-Salmeterol INHALE 1 PUFF BY MOUTH TWICE A DAY       Follow-up Information    Meredith Staggers, MD Follow up.   Specialty: Physical Medicine and Rehabilitation Why: Office will call you with follow up appointment Contact information: [address] [place] [address] [phone]        Shona Needles, MD. Schedule an appointment as soon as possible for a visit.   Specialty: Orthopedic Surgery Why: 2-3 weeks for repeat x-rays of left thumb and left clavicle Contact information: [place] [address] [phone]        Sherren Mocha, MD. Call.   Specialty: Cardiology Contact information: [address] [place] [address] [phone]               Signed: Bary Leriche 08/10/2019, 11:47 AM

## 2019-08-10 DIAGNOSIS — S62502S Fracture of unspecified phalanx of left thumb, sequela: Secondary | ICD-10-CM

## 2019-08-10 DIAGNOSIS — S42009A Fracture of unspecified part of unspecified clavicle, initial encounter for closed fracture: Secondary | ICD-10-CM

## 2019-08-10 DIAGNOSIS — S2249XA Multiple fractures of ribs, unspecified side, initial encounter for closed fracture: Secondary | ICD-10-CM

## 2019-08-10 HISTORY — DX: Multiple fractures of ribs, unspecified side, initial encounter for closed fracture: S22.49XA

## 2019-08-10 HISTORY — DX: Fracture of unspecified part of unspecified clavicle, initial encounter for closed fracture: S42.009A

## 2019-08-10 HISTORY — DX: Fracture of unspecified phalanx of left thumb, sequela: S62.502S

## 2019-08-12 ENCOUNTER — Telehealth: Payer: Self-pay

## 2019-08-12 NOTE — Telephone Encounter (Signed)
Called [name] wife of patient and left detailed message of appt, date, time and location with office phone number to call back.

## 2019-08-13 ENCOUNTER — Other Ambulatory Visit: Payer: Self-pay

## 2019-08-13 ENCOUNTER — Encounter: Payer: Self-pay | Admitting: Physical Therapy

## 2019-08-13 ENCOUNTER — Ambulatory Visit: Payer: PPO | Admitting: Occupational Therapy

## 2019-08-13 ENCOUNTER — Encounter: Payer: Self-pay | Admitting: Occupational Therapy

## 2019-08-13 ENCOUNTER — Ambulatory Visit: Payer: PPO | Attending: Physical Medicine and Rehabilitation | Admitting: Physical Therapy

## 2019-08-13 DIAGNOSIS — R29818 Other symptoms and signs involving the nervous system: Secondary | ICD-10-CM | POA: Diagnosis not present

## 2019-08-13 DIAGNOSIS — M25642 Stiffness of left hand, not elsewhere classified: Secondary | ICD-10-CM | POA: Diagnosis not present

## 2019-08-13 DIAGNOSIS — R41840 Attention and concentration deficit: Secondary | ICD-10-CM | POA: Insufficient documentation

## 2019-08-13 DIAGNOSIS — M6281 Muscle weakness (generalized): Secondary | ICD-10-CM | POA: Diagnosis not present

## 2019-08-13 DIAGNOSIS — R262 Difficulty in walking, not elsewhere classified: Secondary | ICD-10-CM | POA: Diagnosis not present

## 2019-08-13 DIAGNOSIS — R208 Other disturbances of skin sensation: Secondary | ICD-10-CM

## 2019-08-13 DIAGNOSIS — R41841 Cognitive communication deficit: Secondary | ICD-10-CM | POA: Insufficient documentation

## 2019-08-13 DIAGNOSIS — M25632 Stiffness of left wrist, not elsewhere classified: Secondary | ICD-10-CM | POA: Insufficient documentation

## 2019-08-13 DIAGNOSIS — M79642 Pain in left hand: Secondary | ICD-10-CM | POA: Insufficient documentation

## 2019-08-13 DIAGNOSIS — R209 Unspecified disturbances of skin sensation: Secondary | ICD-10-CM | POA: Diagnosis not present

## 2019-08-13 DIAGNOSIS — R2689 Other abnormalities of gait and mobility: Secondary | ICD-10-CM | POA: Diagnosis not present

## 2019-08-13 DIAGNOSIS — R2681 Unsteadiness on feet: Secondary | ICD-10-CM | POA: Diagnosis not present

## 2019-08-13 DIAGNOSIS — M25612 Stiffness of left shoulder, not elsewhere classified: Secondary | ICD-10-CM

## 2019-08-13 DIAGNOSIS — M25512 Pain in left shoulder: Secondary | ICD-10-CM | POA: Insufficient documentation

## 2019-08-13 NOTE — Discharge Summary (Signed)
[facility] Discharge Summary   Patient ID: [name] MRN: [id number] DOB/AGE: Apr 22, 1933 86 y.o.  Admit date: 07/14/2019 Discharge date: 08/13/2019  Discharge Diagnosis Patient Active Problem List   Diagnosis Date Noted  . Fracture of multiple ribs with pain 08/10/2019  . Clavicle fracture 08/10/2019  . Closed displaced fracture of phalanx of left thumb, sequela 08/10/2019  . TBI (traumatic brain injury) ([place]) 07/23/2019  . Traumatic closed fracture of distal clavicle with minimal displacement, left, initial encounter 07/19/2019  . Traumatic brain injury with loss of consciousness ([place])   . Multiple trauma   . Benign prostatic hyperplasia   . Essential hypertension   . AKI (acute kidney injury) ([place])   . Stage 3b chronic kidney disease   . Prediabetes   . Acute blood loss anemia   . Thrombocytopenia ([place])   . Fall 07/14/2019  . Type 2 diabetes mellitus ([place])   . Eosinophilia   . Need for prophylactic vaccination and inoculation against influenza 11/12/2013  . Nocturnal leg cramps 11/23/2012  . Pain in joint, shoulder region 11/23/2012  . Fall 11/04/2012  . Hematoma 11/04/2012  . Acute upper respiratory infections of unspecified site 11/04/2012  . HYPERLIPIDEMIA TYPE IIB / III 03/08/2008  . Orthostatic hypotension 03/08/2008  . CAD, NATIVE VESSEL 03/08/2008    HPI:  24 yom with prior mi, htn presents after unwitnessed fall down stairs.  Does not remember event. Has some history of previous falls.  He complains of left sided back/chest pain and pain while having a deep breath.    Hospital Course:    Workup showed distal left clavicle fracture, left thumb fracture, left rib fracures 1-7, and subarachnoid hemorrhage. The patient was admitted for observation, pain control, and consults with neurosurgery and orthopedic surgery. Injuries were managed as below --   Distal clavicle fx- orthorecommends non op management with sling. Left thumb fx dislocation  -orthohandrecommends non op management. fx relocated and currently inthumb spica splint TBI/SAH-NSGYwithno plans for repeat head scan unless he changes neurologically.Slight forgetfulness at times, but stable. TBI therapies ordered Left posterior 1-7 rib fxwith trace PTX- repeat CXRstable with no PTX. No SOB and O2 sats good. Pain control. PT/OT for mobilization ABL anemia secondary to paraspinal muscle hematoma and abdominal wall contusion- stable ? Perinephric contusion- UA negative for blood.  AKI-stabilized Neck Pain-appreciate NSGY input. MRI c-spine negative.  Prediabetes - hgbA1c of 6.5. Carb mod diet and SSI as needed Acute urinary retention- resolved with urecholine, on doxazosin  On 07/23/19 the patients vitals were stable, mobilizing with therapies, tolerating PO, having bowel function, and medically stable for [facility].  Allergies as of 07/23/2019      Reactions   Gabapentin    Visual changes      Medication List    ASK your doctor about these medications   atorvastatin 40 MG tablet Commonly known as: LIPITOR Take 40 mg by mouth daily.   multivitamin with minerals Tabs tablet Take 1 tablet by mouth daily.         Follow-up Information    Haddix, Thomasene Lot, MD Follow up in 2 week(s).   Specialty: Orthopedic Surgery Why: Follow up for your clavicle fracture, hand fracture, and left ankle sprain Contact information: [place] [address] [phone]        Primary Care Provider Follow up.   Why: follow up as needed for your rib fractures as well as your baseline medication problems  Ashok Pall, MD Follow up.   Specialty: Neurosurgery Why: as needed Contact information: [address] [address] [facility]. Call.   Specialty: Physical Medicine and Rehabilitation Why: as recommended by  inpatient rehab Contact information: [address], [place] [phone]              Signed: Obie Dredge, [facility] 08/13/2019, 4:12 PM

## 2019-08-13 NOTE — Therapy (Signed)
[facility] [address] [place], [place], [address] Phone: [phone]   Fax:  [phone]  Occupational Therapy Evaluation  Patient Details  Name: [name] MRN: [id number] Date of Birth: 02-Nov-1933 Referring Provider (OT): Reesa Chew   Encounter Date: 08/13/2019   OT End of Session - 08/13/19 1340    Visit Number 1    Number of Visits 17    Date for OT Re-Evaluation 10/27/19    Authorization Type [employer] - Follow Medicare guidelines    Progress Note Due on Visit 10    OT Start Time 1230    OT Stop Time 1315    OT Time Calculation (min) 45 min    Activity Tolerance Patient tolerated treatment well    Behavior During Therapy [facility] for tasks assessed/performed           Past Medical History:  Diagnosis Date  . Allergy   . BPH (benign prostatic hyperplasia)   . BPH (benign prostatic hypertrophy)   . CAD (coronary artery disease)    s/p cypher DES to pLAD 6/08; normal LVF;  ETT-Myoview 2009: no ischemia   . Coronary artery disease   . Eosinophilia   . High cholesterol   . HTN (hypertension)   . Hyperlipidemia   . Hypertension   . MI (myocardial infarction) ([place])   . Myocardial infarction ([place])   . Prediabetes   . Trigeminal neuralgia     Past Surgical History:  Procedure Laterality Date  . APPENDECTOMY    . CARDIAC CATHETERIZATION  07/30/2006   CORONARY ANGIOPLASTY WITH STENT PLACEMENT  . CARDIAC CATHETERIZATION    . EXPLORATORY LAPAROTOMY     age 86  . EXPLORATORY LAPAROTOMY      There were no vitals filed for this visit.   Subjective Assessment - 08/13/19 1239    Subjective  I would like to use my memory better.    Currently in Pain? Yes    Pain Score 3     Pain Location Rib cage    Pain Orientation Left    Pain Descriptors / Indicators Aching    Pain Type Acute pain    Pain Radiating Towards rib cage and between shoulder blades    Pain Onset More than a month ago    Pain  Frequency Constant    Aggravating Factors  lying fat             [facility] OT Assessment - 08/13/19 0001      Assessment   Medical Diagnosis TBI    Referring Provider (OT) Reesa Chew    Onset Date/Surgical Date 07/14/19    Hand Dominance Right    Prior Therapy CIR until July 2      Precautions   Precautions Fall      Balance Screen   Has the patient fallen in the past 6 months Yes    How many times? several - when running      Prior Function   Level of Independence Independent with basic ADLs    IT trainer work    Economist for people, bible studies, helping with papers, church work    Leisure married for fun, listen to music, watch soccer, radio - morse code      ADL   Eating/Feeding Minimal assistance    Grooming Minimal assistance    Upper Body Bathing Minimal assistance    Lower Body Bathing Minimal assistance    Upper Body Dressing Moderate assistance  Lower Body Dressing Moderate assistance    Toilet Transfer Supervision/safety    Toileting - Clothing Manipulation Minimal assistance    Toileting -  Hygiene Increase time    Tub/Shower Transfer Supervision/safety      IADL   Prior Level of Function Shopping Independent    Prior Level of Function Light Housekeeping Independent    Prior Level of Function Meal Prep Independent    Prior Level of Function Community Mobility Independent    Prior Level of Function Medication Managment Independent      Written Expression   Dominant Hand Right      Vision - History   Baseline Vision Wears glasses only for reading    Visual History Cataracts    Patient Visual Report --   Watching a cataract in right eye   Additional Comments Reports brief dizzy periods when transitioning from sit to stand      Vision Assessment   Eye Alignment Within Functional Limits    Comment Reports no problems      Cognition   Overall Cognitive Status Impaired/Different from baseline    Area of Impairment  Memory;Attention;Awareness    Current Attention Level Sustained    Memory Decreased short-term memory    Awareness Intellectual    Awareness Comments Looks to wife [name] for confirmation      Observation/Other Assessments   Focus on Therapeutic Outcomes (FOTO)  NA      Posture/Postural Control   Posture/Postural Control Postural limitations    Postural Limitations --   Limited thoracic movement - rib fractures     Sensation   Light Touch Impaired by gross assessment    Additional Comments Hyper sensitive left palm, decreased sensation dorsum of left hand      Coordination   Gross Motor Movements are Fluid and Coordinated No    Fine Motor Movements are Fluid and Coordinated No    9 Hole Peg Test Right;Left    Right 9 Hole Peg Test 38.28    Left 9 Hole Peg Test unable    Coordination limited thumb opposition, flexion, abduction      Perception   Perception Within Functional Limits      Praxis   Praxis Intact      ROM / Strength   AROM / PROM / Strength AROM;Strength      AROM   AROM Assessment Site Shoulder;Wrist    Right/Left Shoulder Left    Left Shoulder Flexion 104 Degrees    Left Shoulder ABduction 95 Degrees   left clavicle fracture   Right/Left Wrist Left    Left Wrist Extension 20 Degrees    Left Wrist Flexion 30 Degrees      Strength   Overall Strength Deficits    Overall Strength Comments 4/5 right shoulder      Hand Function   Right Hand Gross Grasp Functional    Right Hand Grip (lbs) 62.3    Left Hand Gross Grasp Impaired    Left Hand Grip (lbs) --   Not tested                          OT Education - 08/13/19 1340    Education Details evaluation findings and potential goals and plan of care, added finger flex/ext, and wrist flex/ext for LUE    Person(s) Educated Patient;Spouse    Methods Explanation;Demonstration    Comprehension Verbalized understanding;Returned demonstration;Need further instruction  OT Short Term  Goals - 08/13/19 1357      OT SHORT TERM GOAL #1   Title Patient will complete an HEP designed to improve left thumb range of motion with min cueing due 09/27/19    Time 4    Period Weeks    Status New    Target Date 09/27/19      OT SHORT TERM GOAL #2   Title Patient will complete a home exercise program designed to improve coordination in left hand with set up assistance    Time 4    Period Weeks    Status New      OT SHORT TERM GOAL #3   Title Patient will don/doff a pull over shirt with set up assistance    Time 4    Period Weeks    Status New      OT SHORT TERM GOAL #4   Title Patient will demonstrate low reach to obtain a lightweight (less than 3 lb) object with left hand    Time 4    Period Weeks    Status New             OT Long Term Goals - 08/13/19 1401      OT LONG TERM GOAL #1   Title Patient will complete HEP/Home activities program to improve functional mobility and use of left hand due 9/21    Time 8    Period Weeks    Status New    Target Date 10/27/19      OT LONG TERM GOAL #2   Title Patient will bathe and dress himself with modified independence - with increased time    Time 8    Period Weeks    Status New      OT LONG TERM GOAL #3   Title Patient will prepare a familiar hot meal using left hand as non dominant with supervision    Time 8    Period Weeks    Status New      OT LONG TERM GOAL #4   Title Patient will demonstrate ability to manipulate small items within left hand as evidenced by his ability to complete 9 hole peg test in less than 1 minute    Time 8    Period Weeks    Status New      OT LONG TERM GOAL #5   Title Patient will understand recommendations related to driving    Time 8    Period Weeks    Status New                 Plan - 08/13/19 1343    Clinical Impression Statement Patient is an 86 year old man recently discharged from hospital on 08/07/19 s/p TBI after unwitnessed fall on 07/14/19.  Patient suffered  multiple rib fractures on left side, fractured left thumb - s/p closed reduction and left clavicle fracture.  Patient requires min to mod assist for basic self care skills currently due to decreased balance, decreased activity tolerance, pain, decreased strength, range of motion, and attention/insignht and memory and is hopeful to return to greater independence.  Patient has very supportive wife who is providing support currently, and also happy to have him return to greater independence.    OT Occupational Profile and History Detailed Assessment- Review of Records and additional review of physical, cognitive, psychosocial history related to current functional performance    Occupational performance deficits (Please refer to evaluation for details): ADL's;IADL's;Leisure;Rest and  Sleep    Body Structure / Function / Physical Skills ADL;Coordination;Endurance;GMC;UE functional use;Sensation;Decreased knowledge of precautions;Balance;Body mechanics;Decreased knowledge of use of DME;Flexibility;IADL;Pain;Strength;FMC;Dexterity;Cardiopulmonary status limiting activity;ROM;Mobility;Tone    Cognitive Skills Attention;Thought;Problem Solve;Safety Awareness;Memory;Energy/Drive    Rehab Potential Excellent    Clinical Decision Making Several treatment options, min-mod task modification necessary    Comorbidities Affecting Occupational Performance: May have comorbidities impacting occupational performance    Modification or Assistance to Complete Evaluation  Min-Moderate modification of tasks or assist with assess necessary to complete eval    OT Frequency 2x / week    OT Duration 8 weeks    OT Treatment/Interventions Self-care/ADL training;Electrical Stimulation;Iontophoresis;Therapeutic exercise;Moist Heat;Paraffin;Neuromuscular education;Splinting;Patient/family education;Balance training;Therapeutic activities;Functional Mobility Training;Fluidtherapy;Cryotherapy;Ultrasound;DME and/or AE instruction;Manual  Therapy;Passive range of motion;Cognitive remediation/compensation    Plan Update hand exercises, stand balance and functional transfers to clear toileting at night    OT Home Exercise Plan Has HEP from CIR for shoulder and thumb (L)    Consulted and Agree with Plan of Care Patient;Family member/caregiver           Patient will benefit from skilled therapeutic intervention in order to improve the following deficits and impairments:   Body Structure / Function / Physical Skills: ADL, Coordination, Endurance, GMC, UE functional use, Sensation, Decreased knowledge of precautions, Balance, Body mechanics, Decreased knowledge of use of DME, Flexibility, IADL, Pain, Strength, FMC, Dexterity, Cardiopulmonary status limiting activity, ROM, Mobility, Tone Cognitive Skills: Attention, Thought, Problem Solve, Safety Awareness, Memory, Energy/Drive     Visit Diagnosis: Pain in left hand - Plan: Ot plan of care cert/re-cert  Acute pain of left shoulder - Plan: Ot plan of care cert/re-cert  Attention and concentration deficit - Plan: Ot plan of care cert/re-cert  Other disturbances of skin sensation - Plan: Ot plan of care cert/re-cert  Muscle weakness (generalized) - Plan: Ot plan of care cert/re-cert  Stiffness of left wrist, not elsewhere classified - Plan: Ot plan of care cert/re-cert  Stiffness of left shoulder, not elsewhere classified - Plan: Ot plan of care cert/re-cert  Stiffness of left hand, not elsewhere classified - Plan: Ot plan of care cert/re-cert    Problem List Patient Active Problem List   Diagnosis Date Noted  . Fracture of multiple ribs with pain 08/10/2019  . Clavicle fracture 08/10/2019  . Closed displaced fracture of phalanx of left thumb, sequela 08/10/2019  . TBI (traumatic brain injury) ([place]) 07/23/2019  . Traumatic closed fracture of distal clavicle with minimal displacement, left, initial encounter 07/19/2019  . Traumatic brain injury with loss of  consciousness ([place])   . Multiple trauma   . Benign prostatic hyperplasia   . Essential hypertension   . AKI (acute kidney injury) ([place])   . Stage 3b chronic kidney disease   . Prediabetes   . Acute blood loss anemia   . Thrombocytopenia ([place])   . Fall 07/14/2019  . Type 2 diabetes mellitus ([place])   . Eosinophilia   . Need for prophylactic vaccination and inoculation against influenza 11/12/2013  . Nocturnal leg cramps 11/23/2012  . Pain in joint, shoulder region 11/23/2012  . Fall 11/04/2012  . Hematoma 11/04/2012  . Acute upper respiratory infections of unspecified site 11/04/2012  . HYPERLIPIDEMIA TYPE IIB / III 03/08/2008  . Orthostatic hypotension 03/08/2008  . CAD, NATIVE VESSEL 03/08/2008    Mariah Milling, OTR/L 08/13/2019, 2:09 PM  [facility] [address] [place], [place], [address] Phone: [phone]   Fax:  [phone]  Name: [name]  [name] MRN: [id number] Date of Birth: April 18, 1933

## 2019-08-14 NOTE — Therapy (Addendum)
[facility] [address] [place], [place], [address] Phone: [phone]   Fax:  [phone]  Physical Therapy Evaluation  Patient Details  Name: [name] MRN: [id number] Date of Birth: 15-Apr-1933 Referring Provider (PT): Reesa Chew PA-C   Encounter Date: 08/13/2019   PT End of Session - 08/14/19 0747    Visit Number 1    Number of Visits 17    Date for PT Re-Evaluation 11/12/19   written for 60 day POC   Authorization Type [employer] P[employer] - $15 co pay, collect 1 co pay per day for multiple disciplines    PT Start Time 5852    PT Stop Time 1401    PT Time Calculation (min) 44 min    Equipment Utilized During Treatment Gait belt    Activity Tolerance Patient tolerated treatment well    Behavior During Therapy [facility] for tasks assessed/performed;Impulsive           Past Medical History:  Diagnosis Date  . Allergy   . BPH (benign prostatic hyperplasia)   . BPH (benign prostatic hypertrophy)   . CAD (coronary artery disease)    s/p cypher DES to pLAD 6/08; normal LVF;  ETT-Myoview 2009: no ischemia   . Coronary artery disease   . Eosinophilia   . High cholesterol   . HTN (hypertension)   . Hyperlipidemia   . Hypertension   . MI (myocardial infarction) ([place])   . Myocardial infarction ([place])   . Prediabetes   . Trigeminal neuralgia     Past Surgical History:  Procedure Laterality Date  . APPENDECTOMY    . CARDIAC CATHETERIZATION  07/30/2006   CORONARY ANGIOPLASTY WITH STENT PLACEMENT  . CARDIAC CATHETERIZATION    . EXPLORATORY LAPAROTOMY     age 71  . EXPLORATORY LAPAROTOMY      There were no vitals filed for this visit.    Subjective Assessment - 08/13/19 1320    Subjective 86 y.o. male with prior MI, HTN presents after unwitnessed fall down stairs. Pt has a history of falls. Pt found to have L distal clavicle fx and L thumb fx, L posterior ribs 1-7 fx, and small SAH. Hospitalized  07/14/19 and discharged home 08/07/19. Received inpatient rehab. Right now using large base quad cane for gait, prior to being hospitalized, pt was using a walking staff for about a year. Gets dizziness every now and then, sometimes when he stands up, doesn't last very long and is very brief. No falls or almost falls. NWB through LUE. Has been doing his PT exercises at home.    Pertinent History PMH: BPH, CAD, HTN, CKD III, prediabetes, mild cognitive impairment.    Patient Stated Goals wants to walk without worrying about potentially having a dizzy spell - reports 4-5 spells a day that lasts only a couple of seconds.    Currently in Pain? No/denies                     08/13/19 1328  Assessment  Medical Diagnosis TBI  Referring Provider (PT) Reesa Chew PA-C  Onset Date/Surgical Date 07/14/19  Hand Dominance Right  Prior Therapy CIR until July 2  Precautions  Precautions Fall  Precaution Comments NWB LUE  Restrictions  Other Position/Activity Restrictions  L thumb spica splint, NWB LUE  Balance Screen  Has the patient fallen in the past 6 months Yes  How many times? 1 (down the stairs - was hospitalized)  Has the patient  had a decrease in activity level because of a fear of falling?  No  Is the patient reluctant to leave their home because of a fear of falling?  No  Home Environment  Living Environment Private residence  Living Arrangements Spouse/significant other;Children [name] and her 2 children)  Available Help at Discharge Family  Type of [place] to enter  Entrance Stairs-Number of Steps 5  Entrance Stairs-Rails Can reach both  Eek Two level  Alternate Level Stairs-Number of Steps 12-13  Alternate Level Stairs-Rails Right  [place] Other (comment);Tub bench (walking staff, large base quad cane)  Prior Function  Level of Independence Independent with basic ADLs;Independent with household mobility with device  IT trainer  work  Economist for people, bible studies, helping with papers, church work  [employer] features editor, feeding birds and squirrels   Sensation  Light Touch Appears Intact  Coordination  Gross Motor Movements are Fluid and Coordinated No  Heel Shin Test limited by weakness  ROM / Strength  AROM / PROM / Strength Strength  Strength  Strength Assessment Site Knee;Ankle;Hip  Right/Left Hip Right;Left  Right/Left Knee Right;Left  Right/Left Ankle Right;Left  Right Hip Flexion 4/5  Left Hip Flexion 5/5  Right Knee Flexion 5/5  Right Knee Extension 4+/5  Left Knee Flexion 5/5  Left Knee Extension 4/5  Right Ankle Dorsiflexion 4+/5  Left Ankle Dorsiflexion 5/5  Transfers  Transfers Sit to Stand;Stand to Sit  Sit to Stand 5: Supervision;Without upper extremity assist;From bed;4: Min guard  Five time sit to stand comments  8.91 seconds from mat table with no UE support, decr eccentric control, BLE against mat at times  Stand to Sit 5: Supervision;4: Min guard;Without upper extremity assist;To bed  Ambulation/Gait  Ambulation/Gait Yes  Ambulation/Gait Assistance 5: Supervision;4: Min guard  Ambulation/Gait Assistance Details pt at times picking up large base quad cane when ambulating/not placing it on the ground fully. pt needing cues at times during eval to slow gait down. pt's spouse reports that pt has been bumping into things on L/becoming close to the wall, however not noted during eval  Ambulation Distance (Feet)  (clinic distances for eval)  Assistive device Large base quad cane  Gait Pattern Step-through pattern;Decreased stride length;Decreased arm swing - left  Ambulation Surface Level;Indoor  Gait velocity 12.59 seconds = 2.61 ft/sec  Standardized Balance Assessment  Standardized Balance Assessment Berg Balance Test;TUG  Berg Balance Test  Sit to Stand 4  Standing Unsupported 4  Sitting with Back Unsupported but Feet Supported on Floor or Stool 4  Stand to  Sit 4  Transfers 4  Standing Unsupported with Eyes Closed 4  Standing Unsupported with Feet Together 4  From Standing, Reach Forward with Outstretched Arm 4  From Standing Position, Pick up Object from Floor 4  From Standing Position, Turn to Look Behind Over each Shoulder 2  Turn 360 Degrees 3  Standing Unsupported, Alternately Place Feet on Step/Stool 4  Standing Unsupported, One Foot in Front 3  Standing on One Leg 1  Total Score 49  Berg comment: 49/56  Timed Up and Go Test  Normal TUG (seconds) 14.59 (with large base quad cane)      Objective measurements completed on examination: See above findings.       PT Short Term Goals - 08/17/19 0906      PT SHORT TERM GOAL #1   Title Pt will be independent with initial HEP in order to build  upon functional gains made in therapy. 09/14/19    Time 4    Period Weeks    Status New    Target Date 09/14/19      PT SHORT TERM GOAL #2   Title Pt will undergo further assessment of DGI to determine fall risk - STG and LTG to be written as appropriate.    Time 4    Period Weeks    Status New      PT SHORT TERM GOAL #3   Title Pt and pt's spouse will verbalize understanding of fall prevention in the home.    Time 4    Period Weeks    Status New      PT SHORT TERM GOAL #4   Title Stairs to be assessed as appropriate - LTG to be written as appropriate.    Time 4    Period Weeks    Status New      PT SHORT TERM GOAL #5   Title Pt will perform 5x sit <> stand with no BLE bracing against mat table/chair and improved eccentric control in order  to demo improved safety with transfers.    Time 4    Period Weeks    Status New               PT Long Term Goals - 08/17/19 0909      PT LONG TERM GOAL #1   Title Pt will be independent with final HEP in order to build upon functional gains made in therapy. ALL LTGS DUE 10/12/19    Time 8    Period Weeks    Status New    Target Date 10/12/19      PT LONG TERM GOAL #2   Title  Stair goal to be written as appropriate for pt to safely enter/exit home and go up to 2nd floor.    Time 8    Period Weeks    Status New      PT LONG TERM GOAL #3   Title DGI goal to be written as appropriate in order to demo decr fall risk.    Time 8    Period Weeks    Status New      PT LONG TERM GOAL #4   Title Pt will perform TUG with no AD vs. LRAD in 13.5 seconds or less in order to demo decr fall risk.    Time 8    Period Weeks    Status New      PT LONG TERM GOAL #5   Title Pt will ambulate at least 500' with supervision over level indoor and unlevel outdoor surfaces in order to demo improved community mobility.    Time 8    Period Weeks    Status New                 08/17/19 0915  Plan  Clinical Impression Statement Patient is a 86 year old male referred to Neuro OPPT - recently discharged from hospital on 08/07/19 s/p TBI after unwitnessed fall on 07/14/19.  Patient suffered multiple rib fractures on left side, fractured left thumb - s/p closed reduction and left clavicle fracture. Pt is NWB LUE. Pt's PMH is significant for: BPH, CAD, HTN, CKD III, prediabetes, mild cognitive impairment . The following deficits were present during the exam:  decreased functional BLE strength, impaired eccentric control during transfers, decr safety awareness, gait abnormalities, balance impairments, decr coordination. Pt reporting that he has had dizziness  at times at first when standing or during gait, however none noted today during eval. Based on BERG and TUG pt is at a moderate risk for falls. Pt would benefit from skilled PT to address these impairments and functional55 limitations to maximize functional mobility independence  Personal Factors and Comorbidities Comorbidity 3+;Past/Current Experience;Behavior Pattern  Comorbidities BPH, CAD, HTN, CKD III, prediabetes, mild cognitive impairment,  Examination-Activity Limitations Transfers;Stairs;Locomotion Level   Examination-Participation Restrictions Community Activity  Pt will benefit from skilled therapeutic intervention in order to improve on the following deficits Abnormal gait;Decreased balance;Decreased activity tolerance;Decreased coordination;Decreased knowledge of use of DME;Decreased safety awareness;Decreased strength;Difficulty walking;Dizziness;Impaired UE functional use  Stability/Clinical Decision Making Evolving/Moderate complexity  Clinical Decision Making Moderate  Rehab Potential Good  PT Frequency 2x / week  PT Duration 8 weeks  PT Treatment/Interventions ADLs/Self Care Home Management;DME Instruction;Stair training;Therapeutic activities;Functional mobility training;Gait training;Therapeutic exercise;Balance training;Neuromuscular re-education;Patient/family education;Vestibular  PT Next Visit Plan build upon initial HEP given from inpatient rehab, assess stairs, perform DGI (with goal to be set as appropriate). try gait training with smaller base quad cane or SPC with quad tip.  Consulted and Agree with Plan of Care Patient;Family member/caregiver  Family Member Consulted pt's spouse                     Patient will benefit from skilled therapeutic intervention in order to improve the following deficits and impairments:     Visit Diagnosis: Unsteadiness on feet  Other abnormalities of gait and mobility  Muscle weakness (generalized)  Other symptoms and signs involving the nervous system  Difficulty in walking, not elsewhere classified     Problem List Patient Active Problem List   Diagnosis Date Noted  . Fracture of multiple ribs with pain 08/10/2019  . Clavicle fracture 08/10/2019  . Closed displaced fracture of phalanx of left thumb, sequela 08/10/2019  . TBI (traumatic brain injury) ([place]) 07/23/2019  . Traumatic closed fracture of distal clavicle with minimal displacement, left, initial encounter 07/19/2019  . Traumatic brain injury with loss  of consciousness ([place])   . Multiple trauma   . Benign prostatic hyperplasia   . Essential hypertension   . AKI (acute kidney injury) ([place])   . Stage 3b chronic kidney disease   . Prediabetes   . Acute blood loss anemia   . Thrombocytopenia ([place])   . Fall 07/14/2019  . Type 2 diabetes mellitus ([place])   . Eosinophilia   . Need for prophylactic vaccination and inoculation against influenza 11/12/2013  . Nocturnal leg cramps 11/23/2012  . Pain in joint, shoulder region 11/23/2012  . Fall 11/04/2012  . Hematoma 11/04/2012  . Acute upper respiratory infections of unspecified site 11/04/2012  . HYPERLIPIDEMIA TYPE IIB / III 03/08/2008  . Orthostatic hypotension 03/08/2008  . CAD, NATIVE VESSEL 03/08/2008    Arliss Journey, PT, DPT  08/14/2019, 7:49 AM  [facility] [address] [place], [place], [address] Phone: [phone]   Fax:  [phone]  Name: DARREN CALDRON MRN: [id number] Date of Birth: 1933/12/21

## 2019-08-17 NOTE — Addendum Note (Signed)
Addended by: Arliss Journey on: 08/17/2019 09:24 AM   Modules accepted: Orders

## 2019-08-18 ENCOUNTER — Emergency Department (HOSPITAL_COMMUNITY)
Admission: EM | Admit: 2019-08-18 | Discharge: 2019-08-18 | Disposition: A | Discharge status: Home / Self Care | Payer: PPO | Attending: Emergency Medicine | Admitting: Emergency Medicine

## 2019-08-18 ENCOUNTER — Encounter (HOSPITAL_COMMUNITY): Payer: Self-pay

## 2019-08-18 ENCOUNTER — Emergency Department (HOSPITAL_COMMUNITY): Payer: PPO

## 2019-08-18 ENCOUNTER — Telehealth: Payer: Self-pay

## 2019-08-18 ENCOUNTER — Ambulatory Visit: Payer: PPO

## 2019-08-18 ENCOUNTER — Other Ambulatory Visit: Payer: Self-pay

## 2019-08-18 DIAGNOSIS — R519 Headache, unspecified: Secondary | ICD-10-CM | POA: Insufficient documentation

## 2019-08-18 DIAGNOSIS — Y9389 Activity, other specified: Secondary | ICD-10-CM | POA: Diagnosis not present

## 2019-08-18 DIAGNOSIS — Y998 Other external cause status: Secondary | ICD-10-CM | POA: Diagnosis not present

## 2019-08-18 DIAGNOSIS — M545 Low back pain: Secondary | ICD-10-CM | POA: Diagnosis present

## 2019-08-18 DIAGNOSIS — R000 Tachycardia, unspecified: Secondary | ICD-10-CM | POA: Diagnosis not present

## 2019-08-18 DIAGNOSIS — W19XXXA Unspecified fall, initial encounter: Secondary | ICD-10-CM | POA: Diagnosis not present

## 2019-08-18 DIAGNOSIS — M47816 Spondylosis without myelopathy or radiculopathy, lumbar region: Secondary | ICD-10-CM | POA: Diagnosis not present

## 2019-08-18 DIAGNOSIS — Z79899 Other long term (current) drug therapy: Secondary | ICD-10-CM | POA: Diagnosis not present

## 2019-08-18 DIAGNOSIS — N1832 Chronic kidney disease, stage 3b: Secondary | ICD-10-CM | POA: Insufficient documentation

## 2019-08-18 DIAGNOSIS — I129 Hypertensive chronic kidney disease with stage 1 through stage 4 chronic kidney disease, or unspecified chronic kidney disease: Secondary | ICD-10-CM | POA: Insufficient documentation

## 2019-08-18 DIAGNOSIS — E119 Type 2 diabetes mellitus without complications: Secondary | ICD-10-CM | POA: Insufficient documentation

## 2019-08-18 DIAGNOSIS — Y9289 Other specified places as the place of occurrence of the external cause: Secondary | ICD-10-CM | POA: Insufficient documentation

## 2019-08-18 DIAGNOSIS — I2510 Atherosclerotic heart disease of native coronary artery without angina pectoris: Secondary | ICD-10-CM | POA: Diagnosis not present

## 2019-08-18 DIAGNOSIS — Z7951 Long term (current) use of inhaled steroids: Secondary | ICD-10-CM | POA: Diagnosis not present

## 2019-08-18 DIAGNOSIS — S065X9A Traumatic subdural hemorrhage with loss of consciousness of unspecified duration, initial encounter: Secondary | ICD-10-CM | POA: Insufficient documentation

## 2019-08-18 DIAGNOSIS — S065XAA Traumatic subdural hemorrhage with loss of consciousness status unknown, initial encounter: Secondary | ICD-10-CM

## 2019-08-18 DIAGNOSIS — R2681 Unsteadiness on feet: Secondary | ICD-10-CM | POA: Diagnosis not present

## 2019-08-18 DIAGNOSIS — M4306 Spondylolysis, lumbar region: Secondary | ICD-10-CM | POA: Diagnosis not present

## 2019-08-18 DIAGNOSIS — I6200 Nontraumatic subdural hemorrhage, unspecified: Secondary | ICD-10-CM | POA: Diagnosis not present

## 2019-08-18 DIAGNOSIS — R41841 Cognitive communication deficit: Secondary | ICD-10-CM

## 2019-08-18 DIAGNOSIS — S065X0A Traumatic subdural hemorrhage without loss of consciousness, initial encounter: Secondary | ICD-10-CM | POA: Diagnosis not present

## 2019-08-18 LAB — URINALYSIS, ROUTINE W REFLEX MICROSCOPIC
Bacteria, UA: NONE SEEN
Bilirubin Urine: NEGATIVE
Glucose, UA: NEGATIVE mg/dL
Ketones, ur: NEGATIVE mg/dL
Leukocytes,Ua: NEGATIVE
Nitrite: NEGATIVE
Protein, ur: 100 mg/dL — AB
Specific Gravity, Urine: 1.021 (ref 1.005–1.030)
pH: 5 (ref 5.0–8.0)

## 2019-08-18 LAB — CBC
HCT: 34.1 % — ABNORMAL LOW (ref 39.0–52.0)
Hemoglobin: 10.8 g/dL — ABNORMAL LOW (ref 13.0–17.0)
MCH: 31.9 pg (ref 26.0–34.0)
MCHC: 31.7 g/dL (ref 30.0–36.0)
MCV: 100.6 fL — ABNORMAL HIGH (ref 80.0–100.0)
Platelets: 262 10*3/uL (ref 150–400)
RBC: 3.39 MIL/uL — ABNORMAL LOW (ref 4.22–5.81)
RDW: 14.2 % (ref 11.5–15.5)
WBC: 7.6 10*3/uL (ref 4.0–10.5)
nRBC: 0 % (ref 0.0–0.2)

## 2019-08-18 LAB — BASIC METABOLIC PANEL
Anion gap: 12 (ref 5–15)
BUN: 21 mg/dL (ref 8–23)
CO2: 23 mmol/L (ref 22–32)
Calcium: 9.1 mg/dL (ref 8.9–10.3)
Chloride: 101 mmol/L (ref 98–111)
Creatinine, Ser: 1.51 mg/dL — ABNORMAL HIGH (ref 0.61–1.24)
GFR calc Af Amer: 48 mL/min — ABNORMAL LOW (ref >60)
GFR calc non Af Amer: 42 mL/min — ABNORMAL LOW (ref >60)
Glucose, Bld: 118 mg/dL — ABNORMAL HIGH (ref 70–99)
Potassium: 4.3 mmol/L (ref 3.5–5.1)
Sodium: 136 mmol/L (ref 135–145)

## 2019-08-18 IMAGING — CT CT HEAD W/O CM
4 series · 16 of 47 positions shown, 18 images · non-contrast
Comparison: Head CT dated [DATE].

CLINICAL DATA: 86-year-old male with head trauma.

EXAM:
CT HEAD WITHOUT CONTRAST
TECHNIQUE: Contiguous axial images were obtained from the base of the skull
through the vertex without intravenous contrast.

[Series 3: head without · axial · non-contrast · 0.50mm/px · z∈[+398,+533]mm · 7 of 37 slices shown, 9 images]
[im 5/37  brain]
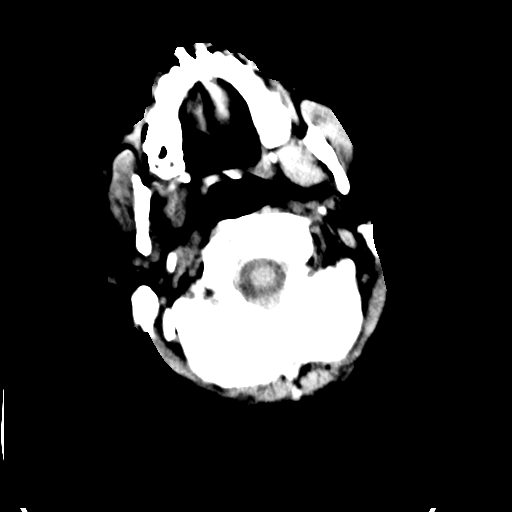
[im 5/37  bone]
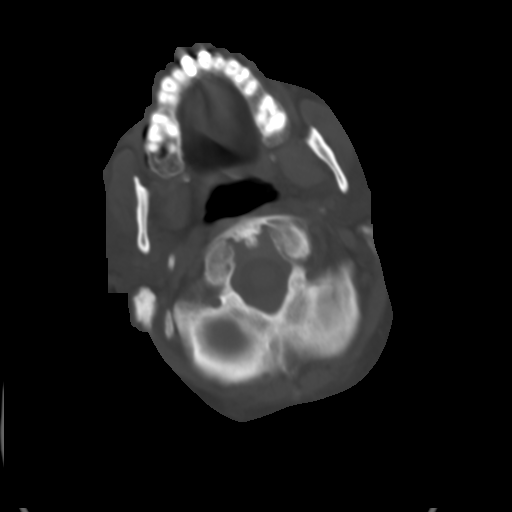
[im 10/37  brain]
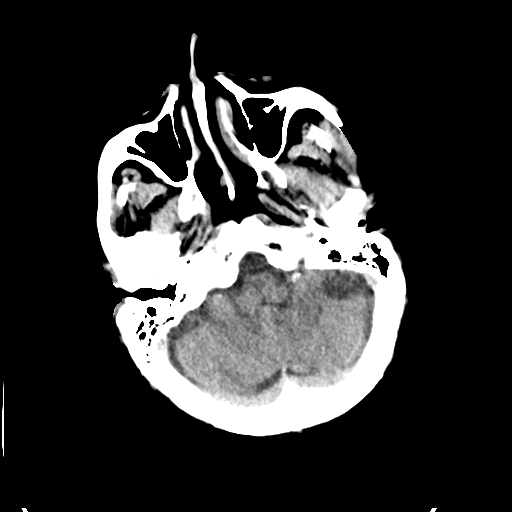
[im 14/37  brain]
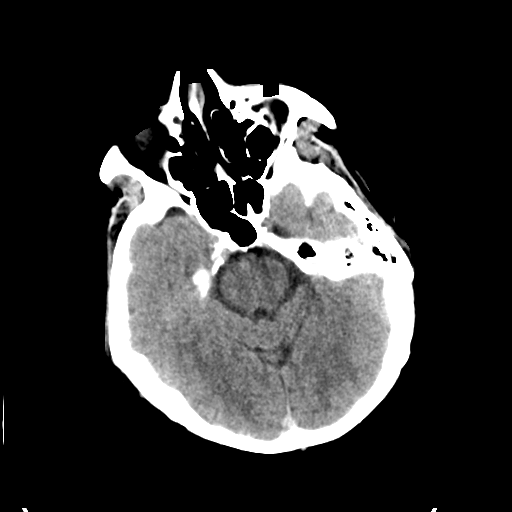
[im 19/37  brain]
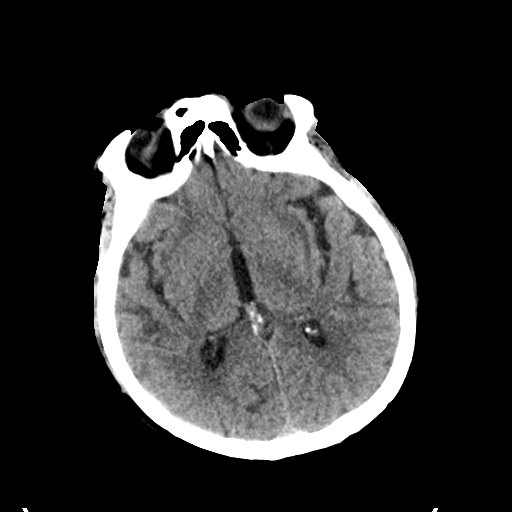
[im 23/37  brain]
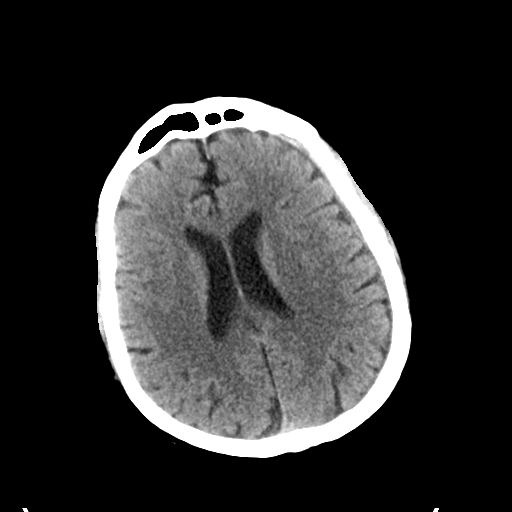
[im 23/37  bone]
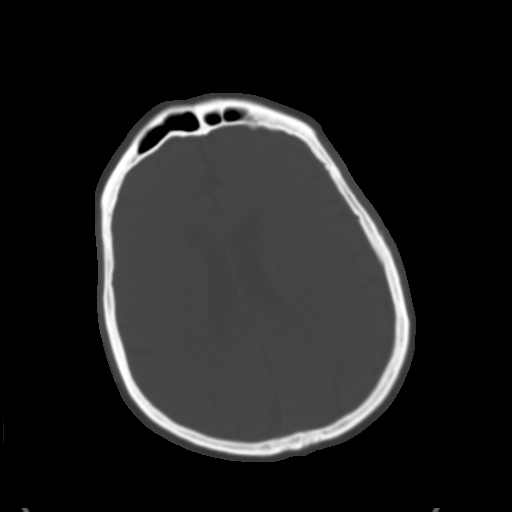
[im 28/37  brain]
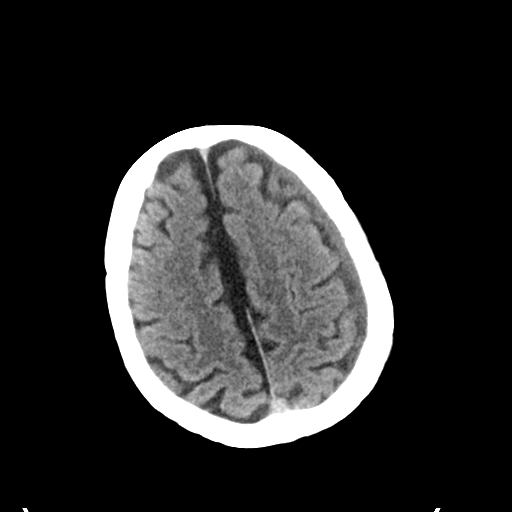
[im 32/37  brain]
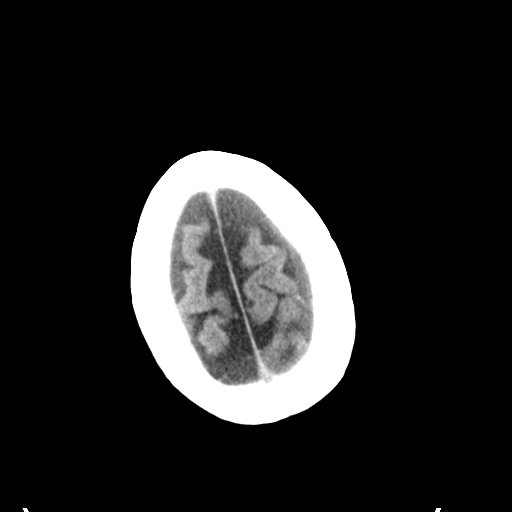

[Series 4: head bone · axial · 0.50mm/px · z∈[+396,+432]mm · 3 of 91 slices shown]
[im 10/91  bone]
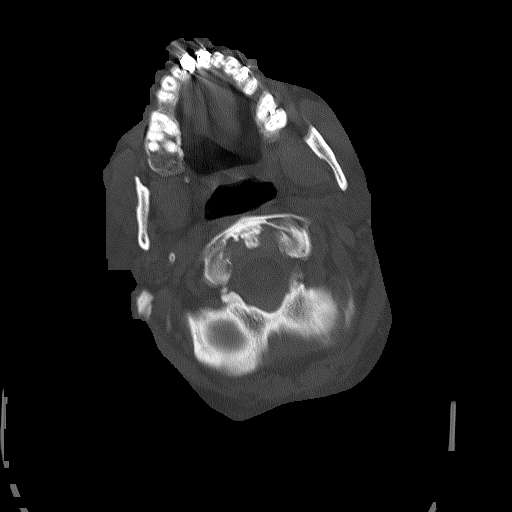
[im 19/91  bone]
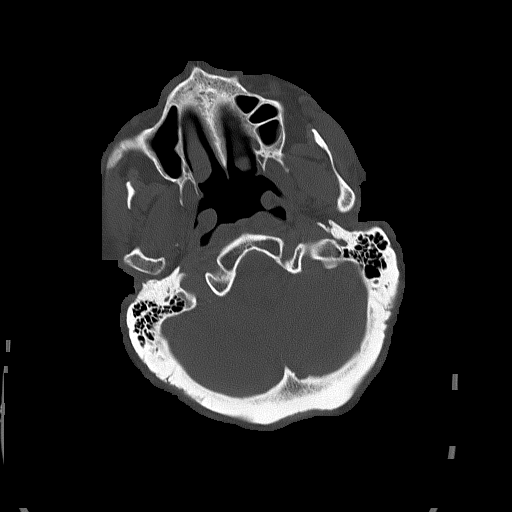
[im 28/91  bone]
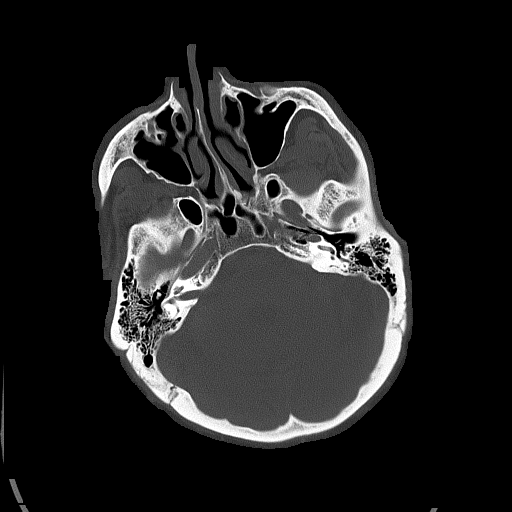

[Series 5: head without cor · coronal · non-contrast · 0.36mm/px · 3 of 79 slices shown]
[im 27/79  brain]
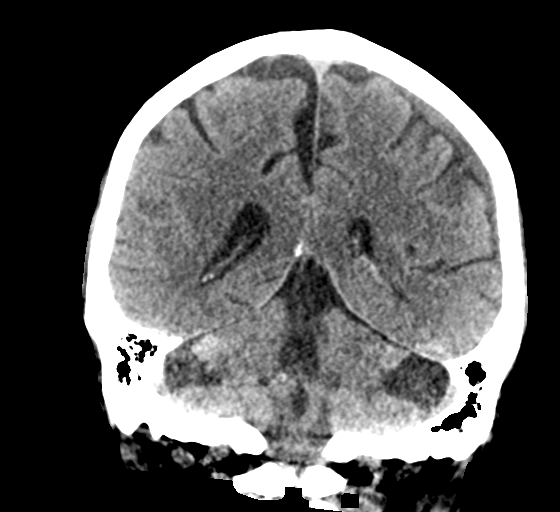
[im 35/79  brain]
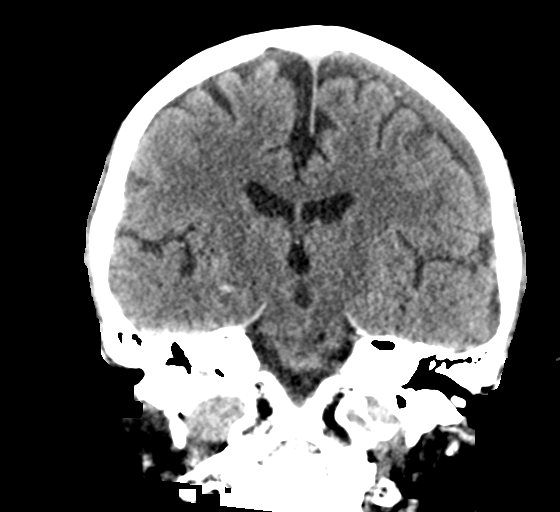
[im 44/79  brain]
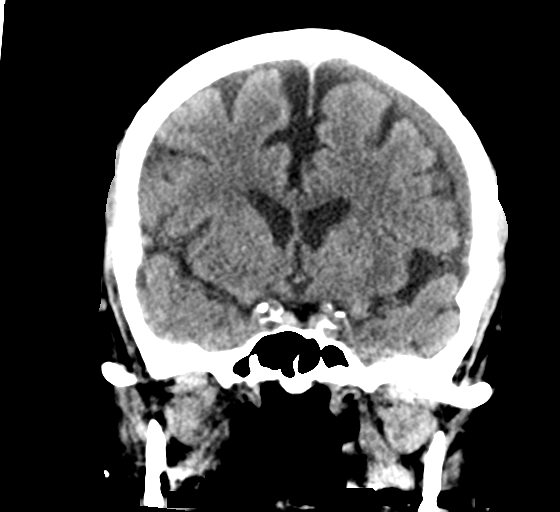

[Series 6: head without sag · sagittal · non-contrast · 0.37mm/px · 3 of 66 slices shown]
[im 24/66  brain]
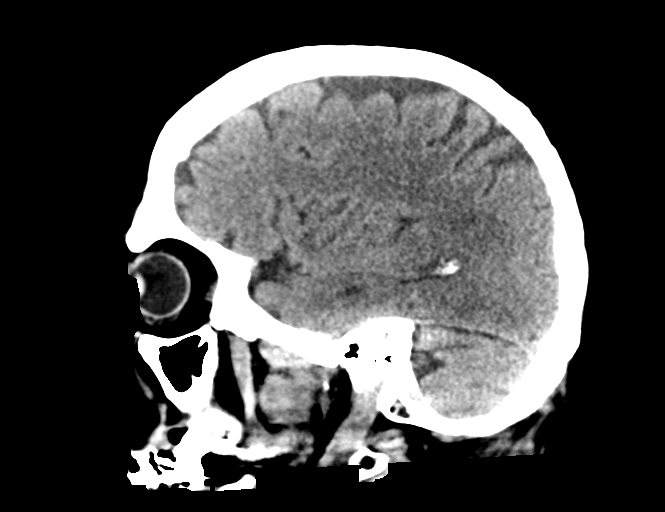
[im 33/66  brain]
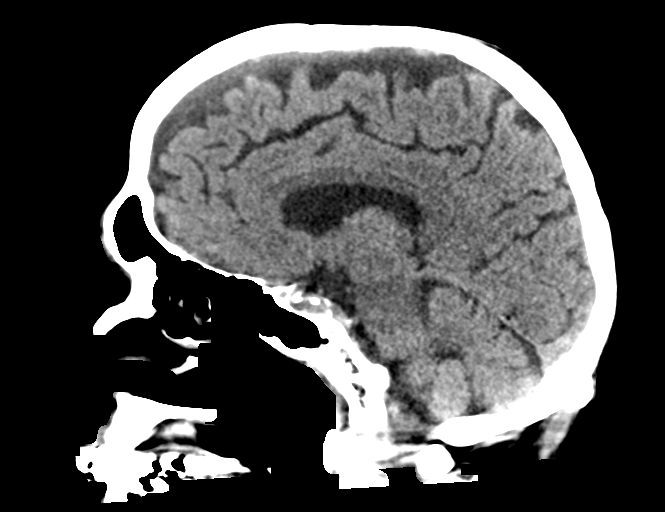
[im 43/66  brain]
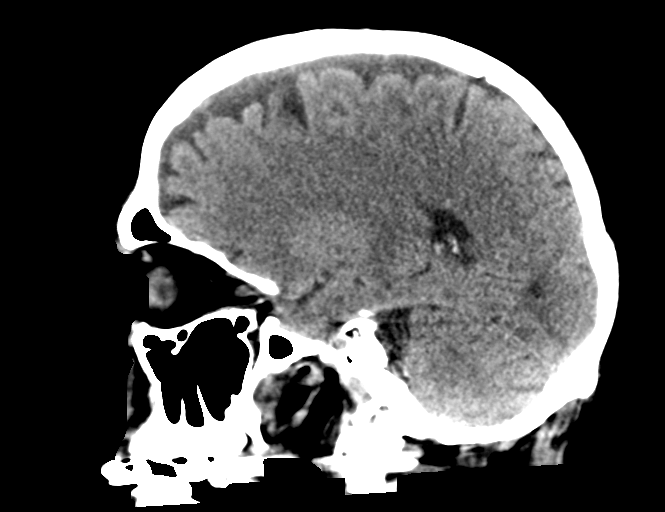

[16 of 47 positions shown; findings below may reference images not displayed]

FINDINGS: Brain: There is a left hemispheric subdural hemorrhage new since the
prior CT and measuring approximately 8 mm in greatest thickness.
This may be acute or subacute. No significant mass effect. No
midline shift. There is moderate age-related atrophy and chronic
microvascular ischemic changes.

Vascular: No hyperdense vessel or unexpected calcification.

Skull: Normal. Negative for fracture or focal lesion.

Sinuses/Orbits: No acute finding.

Other: None
IMPRESSION: 1. Left hemispheric subdural hemorrhage, new since the prior CT. No
significant mass effect or midline shift.
2. Moderate age-related atrophy and chronic microvascular ischemic
changes.

These results were called by telephone at the time of interpretation
on [DATE] at [DATE] to Dr. CHERNIAK, who verbally acknowledged
these results.

## 2019-08-18 MED ORDER — SODIUM CHLORIDE 0.9% FLUSH: 3.0000 mL | Freq: Once | INTRAVENOUS | Status: DC

## 2019-08-18 MED ORDER — PREDNISONE 20 MG PO TABS
20.0000 mg | ORAL_TABLET | Freq: Two times a day (BID) | ORAL | 0 refills | Status: DC
Start: 2019-08-18 — End: 2019-08-20

## 2019-08-18 MED ORDER — PREDNISONE 20 MG PO TABS
60.0000 mg | ORAL_TABLET | Freq: Once | ORAL | Status: AC
  Administered 2019-08-18: 60 mg via ORAL
  Filled 2019-08-18: qty 3

## 2019-08-18 NOTE — Discharge Instructions (Addendum)
Headache you are having, is nonspecific and should improve with symptomatic treatment of Tylenol for pain.  Low back pain is likely related to arthritis.  Continue your usual treatments, follow-up with the rehab doctor tomorrow.  Call the neurosurgeon for follow-up appointment regarding the subdural hematoma.  Use heat on your lower back 3-4 times a day to help the discomfort.  We sent a prescription for prednisone, to your pharmacy.

## 2019-08-18 NOTE — Telephone Encounter (Signed)
Patient wife called stating that patient is having some back pain and bruising, also cold and shaky but no fever. Called and advised patient to take Tramadol and Methocarbamol as directed and if worsens go to ED, also to keep appt for tomorrow.

## 2019-08-18 NOTE — ED Triage Notes (Signed)
PT presents to ED per MD recommendation d/t increased lower back pain, temp of 100.2, headache, and dizziness. Pt was recently discharged after month long admission.

## 2019-08-18 NOTE — Patient Instructions (Signed)
Memory Compensation Strategies  1. Use "WARM" strategy. W= write it down A=  associate it R=  repeat it M=  make a mental picture  2. You can keep a Social worker. Use a 3-ring notebook with sections for the following:  calendar, important names and phone numbers, medications, doctors' names/phone numbers, "to do list"/reminders, and a section to journal what you did each day  3. Use a calendar to write appointments down.  4. Write yourself a schedule for the day.  This can be placed on the calendar or in a separate section of the Memory Notebook.  Keeping a regular schedule can help memory.  5. Use medication organizer with sections for each day or morning/evening pills  You may need help loading it  6. Keep a basket, or pegboard by the door.   Place items that you need to take out with you in the basket or on the pegboard.  You may also want to include a message board for reminders.  7. Use sticky notes or a reminder app. Place sticky notes with reminders in a place where the task is performed.  For example:  "turn off the stove" placed by the stove, "lock the door" placed on the door at eye level, "take your medications" on the bathroom mirror or by the place where you normally take your medications  8. Use alarms/timers.  Use while cooking to remind yourself to check on food or as a reminder to take your medicine, or as a reminder to make a call, or as a reminder to perform another task, etc.  9. Use a small tape recorder or digital recorder to record important information and notes for yourself.

## 2019-08-18 NOTE — ED Provider Notes (Signed)
[facility] EMERGENCY DEPARTMENT Provider Note   CSN: [id number] Arrival date & time: 08/18/19  1748     History Chief Complaint  Patient presents with  . Back Pain  . Dizziness    [name] is a 86 y.o. male.  HPI He presents for evaluation of headache, low back pain and low-grade fever.  Onset of the symptoms today.  Recently hospitalized, for trauma from fall, with intracranial bleeding, clavicle fracture, rib fractures, and femur fracture.  Following hospitalization, he went to rehab, and has now been home for about 10 days.  He has been eating well.  There is been no cough, vomiting, dizziness or new weakness.  His wife checked his temperature today and it was slightly elevated at 100.2.  He is not taking anticoagulants at this time.  No known sick contacts.  There are no other known modifying factors.    Past Medical History:  Diagnosis Date  . Allergy   . BPH (benign prostatic hyperplasia)   . BPH (benign prostatic hypertrophy)   . CAD (coronary artery disease)    s/p cypher DES to pLAD 6/08; normal LVF;  ETT-Myoview 2009: no ischemia   . Coronary artery disease   . Eosinophilia   . High cholesterol   . HTN (hypertension)   . Hyperlipidemia   . Hypertension   . MI (myocardial infarction) ([place])   . Myocardial infarction ([place])   . Prediabetes   . Trigeminal neuralgia     Patient Active Problem List   Diagnosis Date Noted  . Fracture of multiple ribs with pain 08/10/2019  . Clavicle fracture 08/10/2019  . Closed displaced fracture of phalanx of left thumb, sequela 08/10/2019  . TBI (traumatic brain injury) ([place]) 07/23/2019  . Traumatic closed fracture of distal clavicle with minimal displacement, left, initial encounter 07/19/2019  . Traumatic brain injury with loss of consciousness ([place])   . Multiple trauma   . Benign prostatic hyperplasia   . Essential hypertension   . AKI (acute kidney injury) ([place])   . Stage 3b chronic kidney  disease   . Prediabetes   . Acute blood loss anemia   . Thrombocytopenia ([place])   . Fall 07/14/2019  . Type 2 diabetes mellitus ([place])   . Eosinophilia   . Need for prophylactic vaccination and inoculation against influenza 11/12/2013  . Nocturnal leg cramps 11/23/2012  . Pain in joint, shoulder region 11/23/2012  . Fall 11/04/2012  . Hematoma 11/04/2012  . Acute upper respiratory infections of unspecified site 11/04/2012  . HYPERLIPIDEMIA TYPE IIB / III 03/08/2008  . Orthostatic hypotension 03/08/2008  . CAD, NATIVE VESSEL 03/08/2008    Past Surgical History:  Procedure Laterality Date  . APPENDECTOMY    . CARDIAC CATHETERIZATION  07/30/2006   CORONARY ANGIOPLASTY WITH STENT PLACEMENT  . CARDIAC CATHETERIZATION    . EXPLORATORY LAPAROTOMY     age 66  . EXPLORATORY LAPAROTOMY         Family History  Problem Relation Age of Onset  . Stomach cancer Mother   . Heart disease Father   . Heart disease Brother   . Aortic aneurysm Brother   . Colon cancer Brother   . Coronary artery disease Father 6  . Coronary artery disease Brother   . Aortic aneurysm Brother   . Colon cancer Brother   . Esophageal cancer Neg Hx   . Rectal cancer Neg Hx   . Arthritis Daughter        rheumatoid  Social History   Tobacco Use  . Smoking status: Never Smoker  . Smokeless tobacco: Never Used  . Tobacco comment: quit over 29yrs ago.  Substance Use Topics  . Alcohol use: Never  . Drug use: Never    Home Medications Prior to Admission medications   Medication Sig Start Date End Date Taking? Authorizing Provider  albuterol (PROAIR HFA) 108 (90 Base) MCG/ACT inhaler INHALE 2 PUFFS INTO THE LUNGS EVERY 6 HOURS AS NEEDED FOR WHEEZING OR SHORTNESS OF BREATH 08/06/19   Angiulli, Lavon Paganini, PA-C  amitriptyline (ELAVIL) 10 MG tablet Take 1 tablet (10 mg total) by mouth at bedtime. 08/06/19   Angiulli, Lavon Paganini, PA-C  atorvastatin (LIPITOR) 40 MG tablet Take 40 mg by mouth daily.    [provider]  Calcium-Vitamin D 600-200 MG-UNIT tablet Take 1 tablet by mouth 2 (two) times daily. 08/06/19   Angiulli, Lavon Paganini, PA-C  Coenzyme Q-10 100 MG capsule Take 100 mg by mouth daily. Patient not taking: Reported on 08/13/2019    [provider]  doxazosin (CARDURA) 2 MG tablet Take 1 tablet (2 mg total) by mouth daily. 08/06/19   Angiulli, Lavon Paganini, PA-C  escitalopram (LEXAPRO) 10 MG tablet TAKE 1 TABLET BY MOUTH EVERY DAY 11/26/18   [name], MD  ezetimibe (ZETIA) 10 MG tablet Take 1 tablet (10 mg total) by mouth daily. Please make yearly appt with Dr. [name] for October for future refills. 1st attempt 08/03/19   Sherren Mocha, MD  fluticasone [facility]) 50 MCG/ACT nasal spray USE 2 SPRAYS INTO BOTH NOSTRILS DAILY. 02/26/17   [name], MD  fluticasone furoate-vilanterol (BREO ELLIPTA) 200-25 MCG/INH AEPB Inhale 1 puff into the lungs daily. 08/06/19   Angiulli, Lavon Paganini, PA-C  lidocaine (LIDODERM) 5 % Place 1 patch onto the skin daily. Remove & Discard patch within 12 hours or as directed by MD 08/06/19   Angiulli, Lavon Paganini, PA-C  melatonin 3 MG TABS tablet Take 1 tablet (3 mg total) by mouth at bedtime. 08/06/19   Angiulli, Lavon Paganini, PA-C  methocarbamol (ROBAXIN) 500 MG tablet Take 1 tablet (500 mg total) by mouth every 6 (six) hours as needed for muscle spasms. 08/06/19   Angiulli, Lavon Paganini, PA-C  Multiple Vitamin (MULTIVITAMIN WITH MINERALS) TABS tablet Take 1 tablet by mouth daily.    [provider]  NON FORMULARY Take 1 tablet by mouth 2 (two) times daily. Omega Q 1 tab twice daily  Patient not taking: Reported on 08/13/2019    [provider]  polyethylene glycol (MIRALAX / GLYCOLAX) 17 g packet Take 17 g by mouth daily. Patient not taking: Reported on 08/13/2019 08/06/19   Angiulli, Lavon Paganini, PA-C  predniSONE (DELTASONE) 20 MG tablet Take 1 tablet (20 mg total) by mouth 2 (two) times daily. 08/18/19   Daleen Bo, MD  traMADol (ULTRAM) 50 MG tablet  Take 1 tablet (50 mg total) by mouth every 6 (six) hours as needed. 08/06/19   Angiulli, Lavon Paganini, PA-C  WIXELA INHUB 250-50 MCG/DOSE AEPB INHALE 1 PUFF BY MOUTH TWICE A DAY Patient not taking: Reported on 08/13/2019 06/08/19   [name], MD    Allergies    Gabapentin  Review of Systems   Review of Systems  All other systems reviewed and are negative.   Physical Exam Updated Vital Signs BP 139/72 (BP Location: Right Arm)   Pulse 94   Temp 98.9 F (37.2 C) (Oral)   Resp (!) 23  SpO2 97%   Physical Exam Vitals and nursing note reviewed.  Constitutional:      General: He is not in acute distress.    Appearance: He is well-developed. He is not ill-appearing, toxic-appearing or diaphoretic.  HENT:     Head: Normocephalic and atraumatic.     Right Ear: External ear normal.     Left Ear: External ear normal.     Mouth/Throat:     Mouth: Mucous membranes are moist.     Pharynx: No oropharyngeal exudate or posterior oropharyngeal erythema.  Eyes:     Conjunctiva/sclera: Conjunctivae normal.     Pupils: Pupils are equal, round, and reactive to light.  Neck:     Trachea: Phonation normal.  Cardiovascular:     Rate and Rhythm: Normal rate and regular rhythm.     Heart sounds: Normal heart sounds.  Pulmonary:     Effort: Pulmonary effort is normal.     Breath sounds: Normal breath sounds.  Abdominal:     Palpations: Abdomen is soft.     Tenderness: There is no abdominal tenderness.  Musculoskeletal:        General: Normal range of motion.     Cervical back: Normal range of motion and neck supple.     Comments: Normal strength, arms and legs bilaterally.  Skin:    General: Skin is warm and dry.  Neurological:     Mental Status: He is alert and oriented to person, place, and time.     Cranial Nerves: No cranial nerve deficit.     Sensory: No sensory deficit.     Motor: No abnormal muscle tone.     Coordination: Coordination normal.     Comments: No dysarthria or  aphasia.  No pronator drift.  No nystagmus.  No neglect.  Psychiatric:        Mood and Affect: Mood normal.        Behavior: Behavior normal.        Thought Content: Thought content normal.        Judgment: Judgment normal.     ED Results / Procedures / Treatments   Labs (all labs ordered are listed, but only abnormal results are displayed) Labs Reviewed  BASIC METABOLIC PANEL - Abnormal; Notable for the following components:      Result Value   Glucose, Bld 118 (*)    Creatinine, Ser 1.51 (*)    GFR calc non Af Amer 42 (*)    GFR calc Af Amer 48 (*)    All other components within normal limits  CBC - Abnormal; Notable for the following components:   RBC 3.39 (*)    Hemoglobin 10.8 (*)    HCT 34.1 (*)    MCV 100.6 (*)    All other components within normal limits  URINALYSIS, ROUTINE W REFLEX MICROSCOPIC - Abnormal; Notable for the following components:   Hgb urine dipstick MODERATE (*)    Protein, ur 100 (*)    All other components within normal limits  CBG MONITORING, ED    EKG EKG Interpretation  Date/Time:  Tuesday August 18 2019 18:14:57 EDT Ventricular Rate:  110 PR Interval:  196 QRS Duration: 94 QT Interval:  336 QTC Calculation: 454 R Axis:   5 Text Interpretation: Sinus tachycardia Nonspecific T wave abnormality Abnormal ECG Since last tracing new nonspecific T wave abnormality Otherwise no significant change Confirmed by Daleen Bo [phone]) on 08/18/2019 10:18:30 PM   Radiology CT HEAD WO CONTRAST  Result Date: 08/18/2019  CLINICAL DATA:  86 year old male with head trauma. EXAM: CT HEAD WITHOUT CONTRAST TECHNIQUE: Contiguous axial images were obtained from the base of the skull through the vertex without intravenous contrast. COMPARISON:  Head CT dated 07/14/2019. FINDINGS: Brain: There is a left hemispheric subdural hemorrhage new since the prior CT and measuring approximately 8 mm in greatest thickness. This may be acute or subacute. No significant mass  effect. No midline shift. There is moderate age-related atrophy and chronic microvascular ischemic changes. Vascular: No hyperdense vessel or unexpected calcification. Skull: Normal. Negative for fracture or focal lesion. Sinuses/Orbits: No acute finding. Other: None IMPRESSION: 1. Left hemispheric subdural hemorrhage, new since the prior CT. No significant mass effect or midline shift. 2. Moderate age-related atrophy and chronic microvascular ischemic changes. These results were called by telephone at the time of interpretation on 08/18/2019 at 9:35 pm to Dr. Eulis Foster, who verbally acknowledged these results. Electronically Signed   By: Anner Crete M.D.   On: 08/18/2019 21:39    Procedures Procedures (including critical care time)  Medications Ordered in ED Medications  sodium chloride flush (NS) 0.9 % injection 3 mL (has no administration in time range)  predniSONE (DELTASONE) tablet 60 mg (60 mg Oral Given 08/18/19 2238)    ED Course  I have reviewed the triage vital signs and the nursing notes.  Pertinent labs & imaging results that were available during my care of the patient were reviewed by me and considered in my medical decision making (see chart for details).  Clinical Course as of Aug 17 2252  Tue Aug 18, 2019  2138 I fielded a call from the on-call radiologist regarding subdural hematoma.  This was found on CT imaging ordered at triage.  I asked the triage nurse to find the patient in room ASAP, so he can be assessed.   [EW]  2156 Normal  Urinalysis, Routine w reflex microscopic(!) [EW]  2156 Normal except glucose high, creatinine high, GFR low  Basic metabolic panel(!) [EW]  9741 Normal except hemoglobin low, MCV high  CBC(!) [EW]  2228 Discussed case with Dr. Reatha Armour, neurosurgeon.  And felt that these were chronic findings based on case history, and that the patient could be discharged and follow-up with him as needed in the office for management treatment.   [EW]  2244  Left subdural hematoma, age-indeterminate, per radiologist interpretation  CT HEAD WO CONTRAST [EW]    Clinical Course User Index [EW] Daleen Bo, MD   MDM Rules/Calculators/A&P                           Patient Vitals for the past 24 hrs:  BP Temp Temp src Pulse Resp SpO2  08/18/19 2253 -- 98.9 F (37.2 C) Oral -- -- --  08/18/19 2245 139/72 -- -- 94 (!) 23 97 %  08/18/19 2216 131/78 -- -- 93 16 96 %  08/18/19 2006 124/69 -- -- (!) 109 20 98 %  08/18/19 1753 (!) 153/79 99.7 F (37.6 C) Oral (!) 104 18 97 %   10:44 PM Reevaluation with update and discussion. After initial assessment and treatment, an updated evaluation reveals no change in clinical status.  Findings discussed with patient and wife, at the bedside, all questions answered. Daleen Bo   Medical Decision Making:  This patient is presenting for evaluation of headache, back pain, low-grade fever, which does require a range of treatment options, and is a complaint that involves a high risk of morbidity  and mortality. The differential diagnoses include spontaneous subdural hematoma, old injury, nonspecific symptoms. I decided to review old records, and in summary elderly male with traumatic injury about 6 weeks ago, returning today with acute headache and low back pain.  I obtained additional historical information from his wife at the bedside.  Clinical Laboratory Tests Ordered, included CBC, Metabolic panel and Urinalysis. Review indicates normal except mild elevation of creatinine, GFR low, hemoglobin low, blood in urine.. Radiologic Tests Ordered, included CT head.  I independently Visualized: Radiographic images, which show subdural hematoma, age nonspecific    Critical Interventions-clinical evaluation, laboratory testing, CT imaging, review of old records, observation, reassessment  After These Interventions, the Patient was reevaluated and was found stable for discharge.  Patient with multiple trauma  injury, 6 weeks ago, now with atraumatic headache and low back pain.  Review of prior CT imaging indicates moderate degenerative changes of the lower back, likely source for low back pain.  Head CT is not indicative of an acute bleed.  Patient is neurologically normal without significant disability at this time.  He is stable for outpatient management of chronic subdural hematoma.  Doubt cauda equina syndrome.  Doubt serious bacterial infection or metabolic instability  CRITICAL CARE-no Performed by: Daleen Bo  Nursing Notes Reviewed/ Care Coordinated Applicable Imaging Reviewed Interpretation of Laboratory Data incorporated into ED treatment  The patient appears reasonably screened and/or stabilized for discharge and I doubt any other medical condition or other [facility] requiring further screening, evaluation, or treatment in the ED at this time prior to discharge.  Plan: Home Medications-continue usual, use Tylenol for pain; Home Treatments-heat to affected area; return here if the recommended treatment, does not improve the symptoms; Recommended follow up-follow-up with rehab physician tomorrow as scheduled, and neurosurgery in 1 week for checkup and management as needed for subdural hematoma.     Final Clinical Impression(s) / ED Diagnoses Final diagnoses:  Nonintractable headache, unspecified chronicity pattern, unspecified headache type  Subdural hematoma (HCC)  Spondylosis of lumbar region without myelopathy or radiculopathy    Rx / DC Orders ED Discharge Orders         Ordered    predniSONE (DELTASONE) 20 MG tablet  2 times daily     Discontinue  Reprint     08/18/19 2252           Daleen Bo, MD 08/18/19 2254

## 2019-08-18 NOTE — ED Notes (Signed)
Bloodwork sent down to lab for future use.

## 2019-08-19 ENCOUNTER — Encounter: Payer: Self-pay | Admitting: Registered Nurse

## 2019-08-19 ENCOUNTER — Encounter: Payer: PPO | Attending: Registered Nurse | Admitting: Registered Nurse

## 2019-08-19 VITALS — BP 123/73 | HR 91 | Temp 98.1°F | Ht 67.0 in | Wt 188.0 lb

## 2019-08-19 DIAGNOSIS — S069X9D Unspecified intracranial injury with loss of consciousness of unspecified duration, subsequent encounter: Secondary | ICD-10-CM

## 2019-08-19 DIAGNOSIS — S42002S Fracture of unspecified part of left clavicle, sequela: Secondary | ICD-10-CM | POA: Diagnosis not present

## 2019-08-19 DIAGNOSIS — S62502S Fracture of unspecified phalanx of left thumb, sequela: Secondary | ICD-10-CM | POA: Diagnosis not present

## 2019-08-19 NOTE — Therapy (Signed)
[facility] [address] [place], [place], [address] Phone: [phone]   Fax:  [phone]  Speech Language Pathology Evaluation  Patient Details  Name: [name] MRN: [id number] Date of Birth: 03/08/1933 Referring Provider (SLP): Reesa Chew, PA-C   Encounter Date: 08/18/2019   End of Session - 08/19/19 1130    Visit Number 1    Number of Visits 17    Date for SLP Re-Evaluation 11/16/19   90 days   SLP Start Time 1151    SLP Stop Time  1232    SLP Time Calculation (min) 41 min    Activity Tolerance Patient tolerated treatment well           Past Medical History:  Diagnosis Date  . Allergy   . BPH (benign prostatic hyperplasia)   . BPH (benign prostatic hypertrophy)   . CAD (coronary artery disease)    s/p cypher DES to pLAD 6/08; normal LVF;  ETT-Myoview 2009: no ischemia   . Coronary artery disease   . Eosinophilia   . High cholesterol   . HTN (hypertension)   . Hyperlipidemia   . Hypertension   . MI (myocardial infarction) ([place])   . Myocardial infarction ([place])   . Prediabetes   . Trigeminal neuralgia     Past Surgical History:  Procedure Laterality Date  . APPENDECTOMY    . CARDIAC CATHETERIZATION  07/30/2006   CORONARY ANGIOPLASTY WITH STENT PLACEMENT  . CARDIAC CATHETERIZATION    . EXPLORATORY LAPAROTOMY     age 34  . EXPLORATORY LAPAROTOMY      There were no vitals filed for this visit.       SLP Evaluation [facility] - 08/19/19 1937      SLP Visit Information   SLP Received On 08/17/19    Referring Provider (SLP) Reesa Chew, PA-C    Onset Date 07-14-19    Medical Diagnosis TBI      Subjective   Subjective "Nothing, really" (re: SLP "What's different from before the fall." Pt ID'd some differeneces with SLP questioning      General Information   HPI Pt with fall down 4 steps 07-14-19. Rec'd skilled ST on acute and CIR. D/C 08-07-19. CIR ST working with anomia and memory.        Balance Screen   Has the patient fallen in the past 6 months Yes    How many times? several - when running      Prior Functional Status   Cognitive/Linguistic Baseline Within functional limits    Type of [place] work      Cognition   Overall Cognitive Status Impaired/Different from baseline    Area of Impairment Memory;Awareness;Safety/judgement    Current Attention Level Sustained    Memory Decreased short-term memory    Safety/Judgement Decreased awareness of safety;Decreased awareness of deficits    Safety and Judgement Comments Pt stated wife has told him he is more "careless" now and, when asked, states he needs to be more aware/mindful of himself and what he is doing than prior to [place].     Awareness Intellectual    Awareness Comments Looks to wife [name] for confirmation; pt did not name any cognitive deficits when asked about changes to thinking after the fall until directiy questioned by SLP      Auditory Comprehension   Overall Auditory Comprehension Appears within functional limits for tasks assessed  Verbal Expression   Overall Verbal Expression Appears within functional limits for tasks assessed      Motor Speech   Overall Motor Speech Appears within functional limits for tasks assessed      Standardized Assessments   Standardized Assessments  Other Assessment   [facility] Verbal Learning Test   Other Assessment RECALL: 3/12 (below WNL), 2/12 (below WNL), 4/12 (below WNL); RECOGNITION: 8 (more than 2 standard dev beow the mean), suggesting pt has memory encoding/storage deficit.                            SLP Education - 08/19/19 1130    Education Details memory strategies, eval results, possible goals    Person(s) Educated Patient;Spouse    Methods Explanation;Handout    Comprehension Verbalized understanding            SLP Short Term Goals - 08/19/19 1135      SLP SHORT TERM GOAL #1   Title pt  will demo knowledge of his memory copmensation system by attempt to use with rare min cues in 2 sessions    Time 4    Period Weeks      SLP SHORT TERM GOAL #2   Title pt will demo correct/successful orientation to a memory compensation system in 3 sessions    Time 4    Period Weeks    Status New      SLP [place] #3   Title pt will tell SLP 2 cognitive deficits independently, and how they may impact him at home with rare min A in 3 sessions    Time 4    Period Weeks    Status New      SLP SHORT TERM GOAL #4   Title pt will undergo further cognitive linguisitic testing PRN    Time 3    Period Weeks   or 7 total sessions   Status New            SLP Long Term Goals - 08/19/19 1137      SLP LONG TERM GOAL #1   Title pt will demo correct usage of a memory compensation system for appointment and/or medication management, to-do lists, daily schedules in 3 sessions    Time 8    Period Weeks   or 17 total sessions, for all LTGs     SLP LONG TERM GOAL #2   Title pt will demo anticipatory awareness regarding deficit areas, independently, in 3 sessions    Time 8    Period [place] - 08/19/19 1131    Clinical Impression Statement Pt presents today wtih memory deficits as defined by [facility] Verbal Learning Test. A full cognitive evaluation may be necessary at a later date, as pt demonstrated decr'd awareness and possibly decr'd attention skills. Wife states pt requires more cues at home re: schedule and safety precautions, and pt looked to wife for details on history and details of other therapies (tasks, goals, deficits).    Speech Therapy Frequency 2x / week    Duration --   8 weeks or 17 total sessions   Treatment/Interventions Language facilitation;Cueing hierarchy;Cognitive reorganization;Internal/external aids;Patient/family education;Compensatory strategies;SLP instruction and feedback;Functional tasks;Environmental controls    Potential to  Achieve Goals Good    Consulted and Agree with Plan of Care Patient  Patient will benefit from skilled therapeutic intervention in order to improve the following deficits and impairments:   Cognitive communication deficit    Problem List Patient Active Problem List   Diagnosis Date Noted  . Fracture of multiple ribs with pain 08/10/2019  . Clavicle fracture 08/10/2019  . Closed displaced fracture of phalanx of left thumb, sequela 08/10/2019  . TBI (traumatic brain injury) ([place]) 07/23/2019  . Traumatic closed fracture of distal clavicle with minimal displacement, left, initial encounter 07/19/2019  . Traumatic brain injury with loss of consciousness ([place])   . Multiple trauma   . Benign prostatic hyperplasia   . Essential hypertension   . AKI (acute kidney injury) ([place])   . Stage 3b chronic kidney disease   . Prediabetes   . Acute blood loss anemia   . Thrombocytopenia ([place])   . Fall 07/14/2019  . Type 2 diabetes mellitus ([place])   . Eosinophilia   . Need for prophylactic vaccination and inoculation against influenza 11/12/2013  . Nocturnal leg cramps 11/23/2012  . Pain in joint, shoulder region 11/23/2012  . Fall 11/04/2012  . Hematoma 11/04/2012  . Acute upper respiratory infections of unspecified site 11/04/2012  . HYPERLIPIDEMIA TYPE IIB / III 03/08/2008  . Orthostatic hypotension 03/08/2008  . CAD, NATIVE VESSEL 03/08/2008    [facility] ,MS, [place]  08/19/2019, 11:40 AM  [facility] [address] [place], [place], [address] Phone: [phone]   Fax:  [phone]  Name: [name] MRN: [id number] Date of Birth: 05-24-1933

## 2019-08-19 NOTE — Progress Notes (Signed)
Subjective:    Patient ID: [name], male    DOB: Apr 20, 1933, 86 y.o.   MRN: [id number]  HPI: [name] is a 86 y.o. male who is here for hospital follow up appointment of his TBI, Closed Displaced fracture of phalanx of left thumb, Fracture of Multiple Ribs with Pain and Clavicle Fracture. [name] was brought to Hospital via EMS after a fall, he fell down 12 stairs on 07/14/2019.  DG Pelvis:  IMPRESSION: Negative. DG: Chest:  IMPRESSION: Cardiomegaly.  Question left base infiltrate or atelectasis.  Abnormal appearance of the left posterolateral second, third and fourth ribs, age indeterminate. Consider chest CT or rib detail films. DG Left Shoulder:  IMPRESSION: Probably acute fractures of the left second through fifth ribs posteriorly/laterally.  Distal clavicular fracture.  CT Head WO Contrast:  IMPRESSION: Small volume of subarachnoid hemorrhage on the left. Scalp contusion on the left without underlying fracture is also seen.  Sinus disease. care Neurosurgery was consulted and recommended conservative and to repeat CT if neurological decline noted.   Orthopedics was consulted, they recommended a sling for immobilization of LUE.   [name] was admitted to inpatient rehabilitation on 07/23/2019 and discharged home on 08/07/2019. He denies any pain at this time. He rated his pain in Health and History 4. Also reports his appetite is poor, we discussed eating small frequent meals, Mr and Mrs [name] verbalizes understanding. [name] has a scheduled appointment with Out Patient Neuro-rehabilitation.  [name] went to ED on 08/18/2019 with complaints of headache, low back pain and low-grade fever. Note was reviewed and Discussed with Dr Ranell Patrick. We will continue to monitor.  Mr. And Mrs Mcclafferty asked if they could travel to see family in [facility] and Mr. Budlong wanted to have a small glass wine, these question were discussed with  Dr Ranell Patrick. Dr Ranell Patrick gave [name] permission to travel and to occasionally have a glass wine, Mr. And Mrs gaylon [name] understanding.    Pain Inventory Average Pain 4 Pain Right Now 4 My pain is sharp and aching  In the last 24 hours, has pain interfered with the following? General activity 0 Relation with others 0 Enjoyment of life 0 What TIME of day is your pain at its worst? morning Sleep (in general) Good  Pain is worse with: walking, bending, unsure and some activites Pain improves with: rest, heat/ice and medication Relief from Meds: 5  Mobility walk with assistance use a cane do you drive?  no Do you have any goals in this area?  yes  Function retired I need assistance with the following:  dressing, bathing, meal prep, household duties and shopping  Neuro/Psych tremor dizziness confusion depression loss of taste or smell  Prior Studies Any changes since last visit?  no  Physicians involved in your care Any changes since last visit?  no   Family History  Problem Relation Age of Onset  . Stomach cancer Mother   . Heart disease Father   . Heart disease Brother   . Aortic aneurysm Brother   . Colon cancer Brother   . Coronary artery disease Father 60  . Coronary artery disease Brother   . Aortic aneurysm Brother   . Colon cancer Brother   . Esophageal cancer Neg Hx   . Rectal cancer Neg Hx   . Arthritis Daughter        rheumatoid   Social History   Socioeconomic History  . Marital status: Married  Spouse name: [name]  . Number of children: 1  . Years of education: Not on file  . Highest education level: Not on file  Occupational History  . Occupation: retired professor    Comment: History   . Occupation: missionary  Tobacco Use  . Smoking status: Never Smoker  . Smokeless tobacco: Never Used  . Tobacco comment: quit over 37yrs ago.  Substance and Sexual Activity  . Alcohol use: Never  . Drug use: Never  . Sexual activity:  Yes    Partners: Female  Other Topics Concern  . Not on file  Social History Narrative   ** Merged History Encounter **       Lives with his wife (second marriage in 2015, first marriage ended when his wife died alzheimer's disease). Since his remarriage, his adult daughter doesn't speak with him.   Social Determinants of Health   Financial Resource Strain:   . Difficulty of Paying Living Expenses:   Food Insecurity:   . Worried About Charity fundraiser in the Last Year:   . Arboriculturist in the Last Year:   Transportation Needs:   . Film/video editor (Medical):   Marland Kitchen Lack of Transportation (Non-Medical):   Physical Activity:   . Days of Exercise per Week:   . Minutes of Exercise per Session:   Stress:   . Feeling of Stress :   Social Connections:   . Frequency of Communication with Friends and Family:   . Frequency of Social Gatherings with Friends and Family:   . Attends Religious Services:   . Active Member of Clubs or Organizations:   . Attends Archivist Meetings:   Marland Kitchen Marital Status:    Past Surgical History:  Procedure Laterality Date  . APPENDECTOMY    . CARDIAC CATHETERIZATION  07/30/2006   CORONARY ANGIOPLASTY WITH STENT PLACEMENT  . CARDIAC CATHETERIZATION    . EXPLORATORY LAPAROTOMY     age 54  . EXPLORATORY LAPAROTOMY     Past Medical History:  Diagnosis Date  . Allergy   . BPH (benign prostatic hyperplasia)   . BPH (benign prostatic hypertrophy)   . CAD (coronary artery disease)    s/p cypher DES to pLAD 6/08; normal LVF;  ETT-Myoview 2009: no ischemia   . Coronary artery disease   . Eosinophilia   . High cholesterol   . HTN (hypertension)   . Hyperlipidemia   . Hypertension   . MI (myocardial infarction) ([place])   . Myocardial infarction ([place])   . Prediabetes   . Trigeminal neuralgia    BP 123/73   Pulse 91   Temp 98.1 F (36.7 C)   Ht 5\' 7"  (1.702 m)   Wt 188 lb (85.3 kg)   SpO2 94%   BMI 29.44 kg/m   Opioid Risk Score:    Fall Risk Score:  `1  Depression screen PHQ 2/9  Depression screen [facility] 2/9 04/07/2018 03/18/2018 10/10/2017 05/06/2017 04/04/2017 04/02/2016 05/30/2015  Decreased Interest 1 2 0 0 0 0 0  Down, Depressed, Hopeless 0 1 0 0 0 0 0  PHQ - 2 Score 1 3 0 0 0 0 0  Altered sleeping 0 2 - - - - -  Tired, decreased energy 0 0 - - - - -  Change in appetite 0 0 - - - - -  Feeling bad or failure about yourself  0 0 - - - - -  Trouble concentrating 0 0 - - - - -  Moving slowly or fidgety/restless 0 0 - - - - -  Suicidal thoughts 0 0 - - - - -  PHQ-9 Score 1 5 - - - - -  Difficult doing work/chores Not difficult at all Not difficult at all - - - - -    Review of Systems  Constitutional: Positive for appetite change, chills, diaphoresis, fever and unexpected weight change.  HENT: Negative.   Eyes: Negative.   Respiratory: Negative.   Cardiovascular: Positive for leg swelling.  Gastrointestinal: Negative.   Endocrine: Negative.   Genitourinary: Negative.   Musculoskeletal: Negative.   Skin: Negative.   Allergic/Immunologic: Negative.   Neurological: Positive for dizziness and tremors.  Hematological: Negative.   Psychiatric/Behavioral: Positive for confusion and dysphoric mood.  All other systems reviewed and are negative.      Objective:   Physical Exam Vitals and nursing note reviewed.  Constitutional:      Appearance: Normal appearance.  Cardiovascular:     Rate and Rhythm: Normal rate and regular rhythm.     Pulses: Normal pulses.     Heart sounds: Normal heart sounds.  Pulmonary:     Effort: Pulmonary effort is normal.     Breath sounds: Normal breath sounds.  Musculoskeletal:     Cervical back: Normal range of motion and neck supple.     Comments: Normal Muscle Bulk and Muscle Testing Reveals:  Upper Extremities: Right: Full ROM and Muscle Strength 5/5 Left Upper Extremity: Decreased ROM 90 Degrees and Muscle Strength 2/5 Wearing Left Wrist Splint Lower Extremities: Full ROM and  Muscle Strength 5/5 Arises from Table Slowly using walking  Stick for support Narrow Based Gait   Skin:    General: Skin is warm and dry.  Neurological:     Mental Status: He is alert and oriented to person, place, and time.  Psychiatric:        Mood and Affect: Mood normal.        Behavior: Behavior normal.           Assessment & Plan:  1.TBI: Continue with Outpatient Therapy at [facility]. Continue to Monitor.  2.Closed Displaced fracture of phalanx of left thumb, Fracture of Multiple Ribs with Pain and Clavicle Fracture.Has a HFU appointment with Orthopedics Dr Doreatha Martin. Continue to Monitor.   20 minutes of face to face patient care time was spent during this visit. All questions were encouraged and answered.  F/U with Dr Ranell Patrick in 4-6 weeks

## 2019-08-20 ENCOUNTER — Emergency Department (HOSPITAL_COMMUNITY): Payer: PPO

## 2019-08-20 ENCOUNTER — Emergency Department (HOSPITAL_COMMUNITY)
Admission: EM | Admit: 2019-08-20 | Discharge: 2019-08-20 | Disposition: A | Discharge status: Home / Self Care | Payer: PPO | Attending: Emergency Medicine | Admitting: Emergency Medicine

## 2019-08-20 ENCOUNTER — Encounter (HOSPITAL_COMMUNITY): Payer: Self-pay | Admitting: Emergency Medicine

## 2019-08-20 ENCOUNTER — Other Ambulatory Visit: Payer: Self-pay

## 2019-08-20 DIAGNOSIS — R7303 Prediabetes: Secondary | ICD-10-CM | POA: Insufficient documentation

## 2019-08-20 DIAGNOSIS — N1832 Chronic kidney disease, stage 3b: Secondary | ICD-10-CM | POA: Diagnosis not present

## 2019-08-20 DIAGNOSIS — I129 Hypertensive chronic kidney disease with stage 1 through stage 4 chronic kidney disease, or unspecified chronic kidney disease: Secondary | ICD-10-CM | POA: Insufficient documentation

## 2019-08-20 DIAGNOSIS — I700 Atherosclerosis of aorta: Secondary | ICD-10-CM | POA: Diagnosis not present

## 2019-08-20 DIAGNOSIS — R339 Retention of urine, unspecified: Secondary | ICD-10-CM | POA: Insufficient documentation

## 2019-08-20 DIAGNOSIS — Z79899 Other long term (current) drug therapy: Secondary | ICD-10-CM | POA: Diagnosis not present

## 2019-08-20 DIAGNOSIS — G319 Degenerative disease of nervous system, unspecified: Secondary | ICD-10-CM | POA: Diagnosis not present

## 2019-08-20 DIAGNOSIS — I6200 Nontraumatic subdural hemorrhage, unspecified: Secondary | ICD-10-CM | POA: Diagnosis not present

## 2019-08-20 DIAGNOSIS — I2510 Atherosclerotic heart disease of native coronary artery without angina pectoris: Secondary | ICD-10-CM | POA: Diagnosis not present

## 2019-08-20 DIAGNOSIS — J90 Pleural effusion, not elsewhere classified: Secondary | ICD-10-CM | POA: Diagnosis not present

## 2019-08-20 DIAGNOSIS — K862 Cyst of pancreas: Secondary | ICD-10-CM | POA: Diagnosis not present

## 2019-08-20 DIAGNOSIS — R935 Abnormal findings on diagnostic imaging of other abdominal regions, including retroperitoneum: Secondary | ICD-10-CM | POA: Insufficient documentation

## 2019-08-20 DIAGNOSIS — I6782 Cerebral ischemia: Secondary | ICD-10-CM | POA: Diagnosis not present

## 2019-08-20 DIAGNOSIS — S065X0A Traumatic subdural hemorrhage without loss of consciousness, initial encounter: Secondary | ICD-10-CM | POA: Diagnosis not present

## 2019-08-20 DIAGNOSIS — R410 Disorientation, unspecified: Secondary | ICD-10-CM | POA: Diagnosis not present

## 2019-08-20 DIAGNOSIS — I714 Abdominal aortic aneurysm, without rupture: Secondary | ICD-10-CM | POA: Diagnosis not present

## 2019-08-20 DIAGNOSIS — R4182 Altered mental status, unspecified: Secondary | ICD-10-CM | POA: Diagnosis present

## 2019-08-20 DIAGNOSIS — K8689 Other specified diseases of pancreas: Secondary | ICD-10-CM | POA: Diagnosis not present

## 2019-08-20 LAB — URINALYSIS, ROUTINE W REFLEX MICROSCOPIC
Bilirubin Urine: NEGATIVE
Glucose, UA: NEGATIVE mg/dL
Hgb urine dipstick: NEGATIVE
Ketones, ur: NEGATIVE mg/dL
Leukocytes,Ua: NEGATIVE
Nitrite: NEGATIVE
Protein, ur: 30 mg/dL — AB
Specific Gravity, Urine: 1.02 (ref 1.005–1.030)
pH: 5 (ref 5.0–8.0)

## 2019-08-20 LAB — COMPREHENSIVE METABOLIC PANEL
ALT: 53 U/L — ABNORMAL HIGH (ref 0–44)
AST: 50 U/L — ABNORMAL HIGH (ref 15–41)
Albumin: 3.1 g/dL — ABNORMAL LOW (ref 3.5–5.0)
Alkaline Phosphatase: 201 U/L — ABNORMAL HIGH (ref 38–126)
Anion gap: 10 (ref 5–15)
BUN: 32 mg/dL — ABNORMAL HIGH (ref 8–23)
CO2: 23 mmol/L (ref 22–32)
Calcium: 9.1 mg/dL (ref 8.9–10.3)
Chloride: 103 mmol/L (ref 98–111)
Creatinine, Ser: 1.62 mg/dL — ABNORMAL HIGH (ref 0.61–1.24)
GFR calc Af Amer: 45 mL/min — ABNORMAL LOW (ref >60)
GFR calc non Af Amer: 38 mL/min — ABNORMAL LOW (ref >60)
Glucose, Bld: 196 mg/dL — ABNORMAL HIGH (ref 70–99)
Potassium: 4.5 mmol/L (ref 3.5–5.1)
Sodium: 136 mmol/L (ref 135–145)
Total Bilirubin: 0.8 mg/dL (ref 0.3–1.2)
Total Protein: 6.5 g/dL (ref 6.5–8.1)

## 2019-08-20 LAB — CBC
HCT: 32.9 % — ABNORMAL LOW (ref 39.0–52.0)
Hemoglobin: 10.7 g/dL — ABNORMAL LOW (ref 13.0–17.0)
MCH: 32.5 pg (ref 26.0–34.0)
MCHC: 32.5 g/dL (ref 30.0–36.0)
MCV: 100 fL (ref 80.0–100.0)
Platelets: 349 10*3/uL (ref 150–400)
RBC: 3.29 MIL/uL — ABNORMAL LOW (ref 4.22–5.81)
RDW: 13.9 % (ref 11.5–15.5)
WBC: 15 10*3/uL — ABNORMAL HIGH (ref 4.0–10.5)
nRBC: 0 % (ref 0.0–0.2)

## 2019-08-20 LAB — NA AND K (SODIUM & POTASSIUM), RAND UR
Potassium Urine: 60 mmol/L
Sodium, Ur: <10 mmol/L

## 2019-08-20 LAB — CREATININE, URINE, RANDOM: Creatinine, Urine: 199.64 mg/dL

## 2019-08-20 IMAGING — CR DG CHEST 2V
2 series · 2 of 2 positions shown · non-contrast
Comparison: [DATE]

CLINICAL DATA: Confusion

EXAM:
CHEST - 2 VIEW

[chest lat]
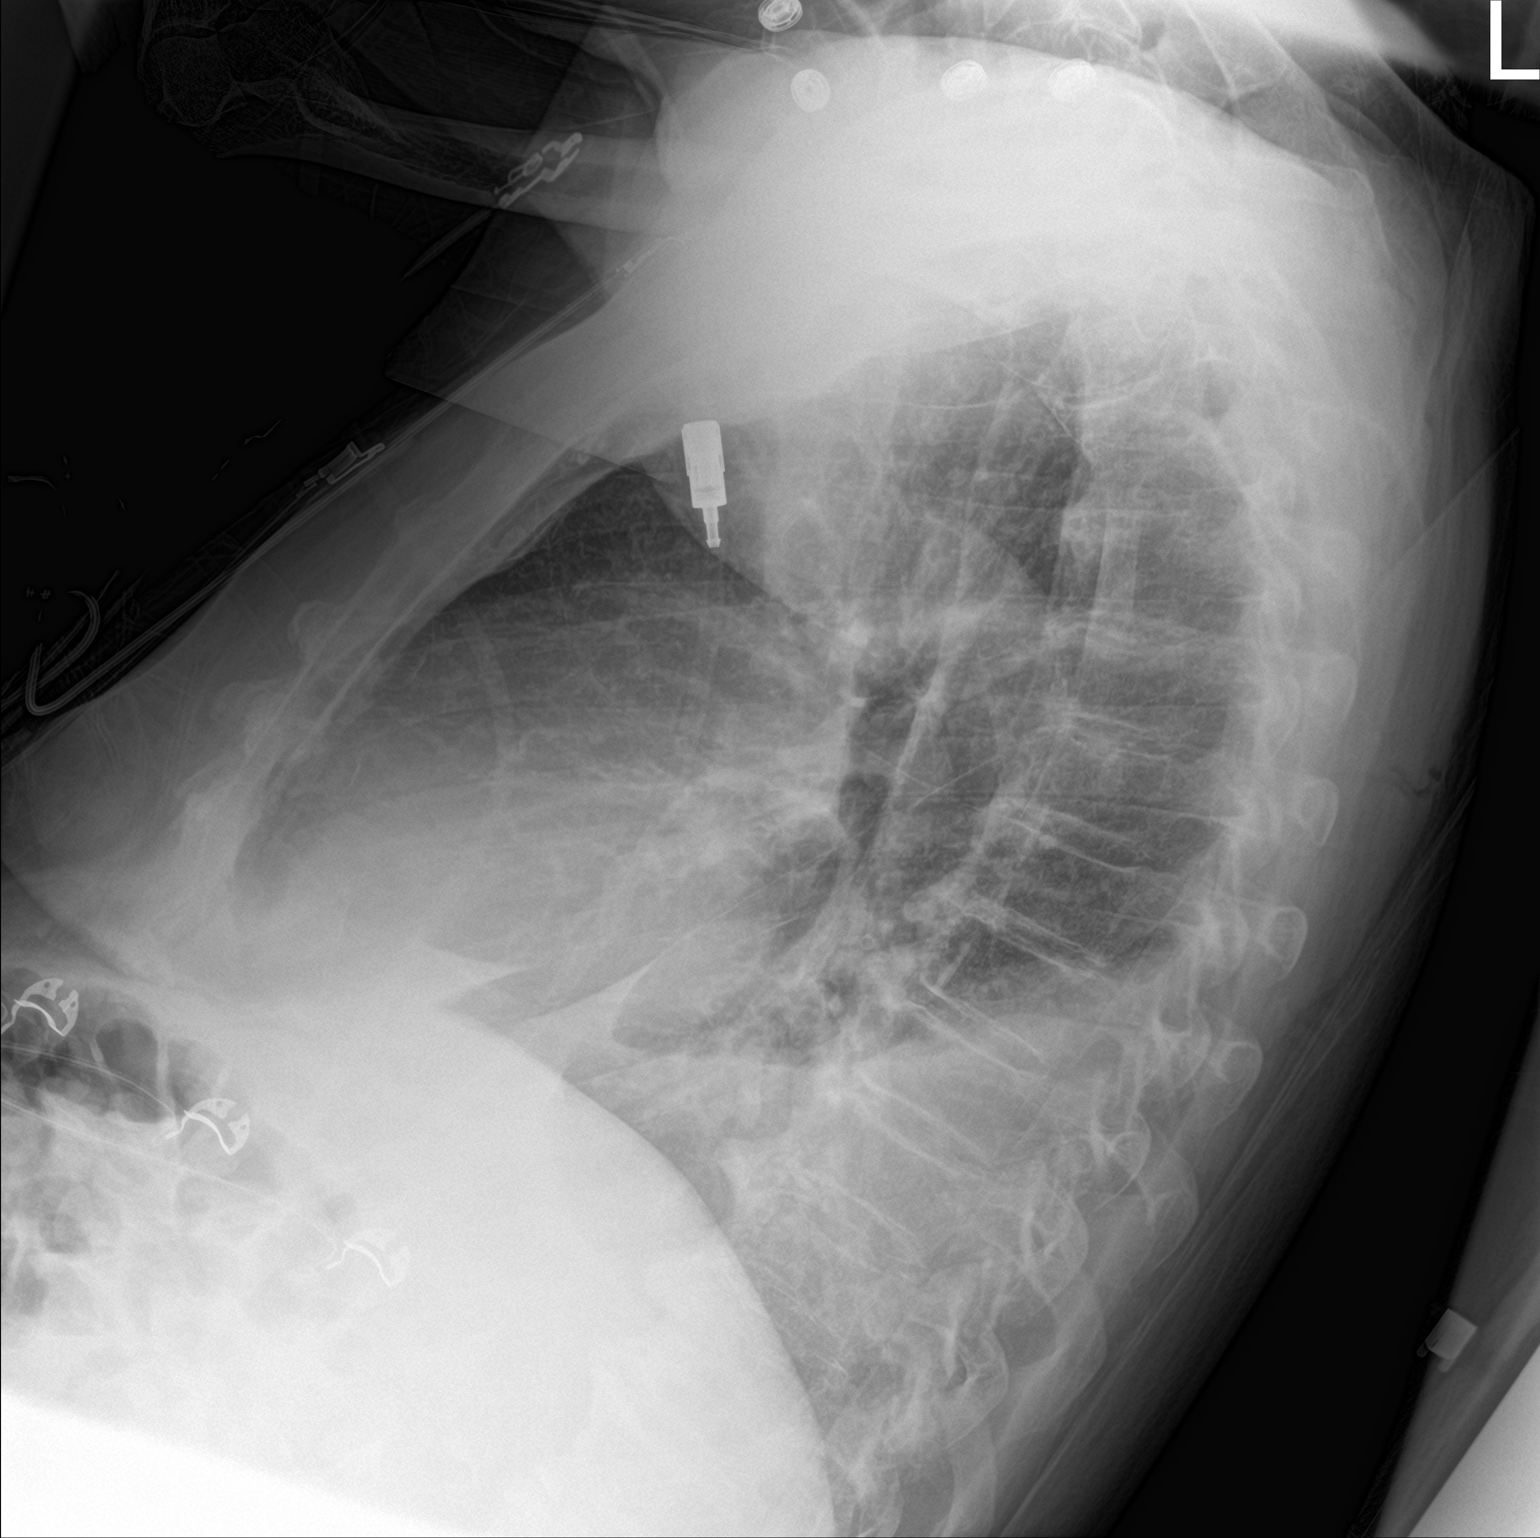

[chest ap]
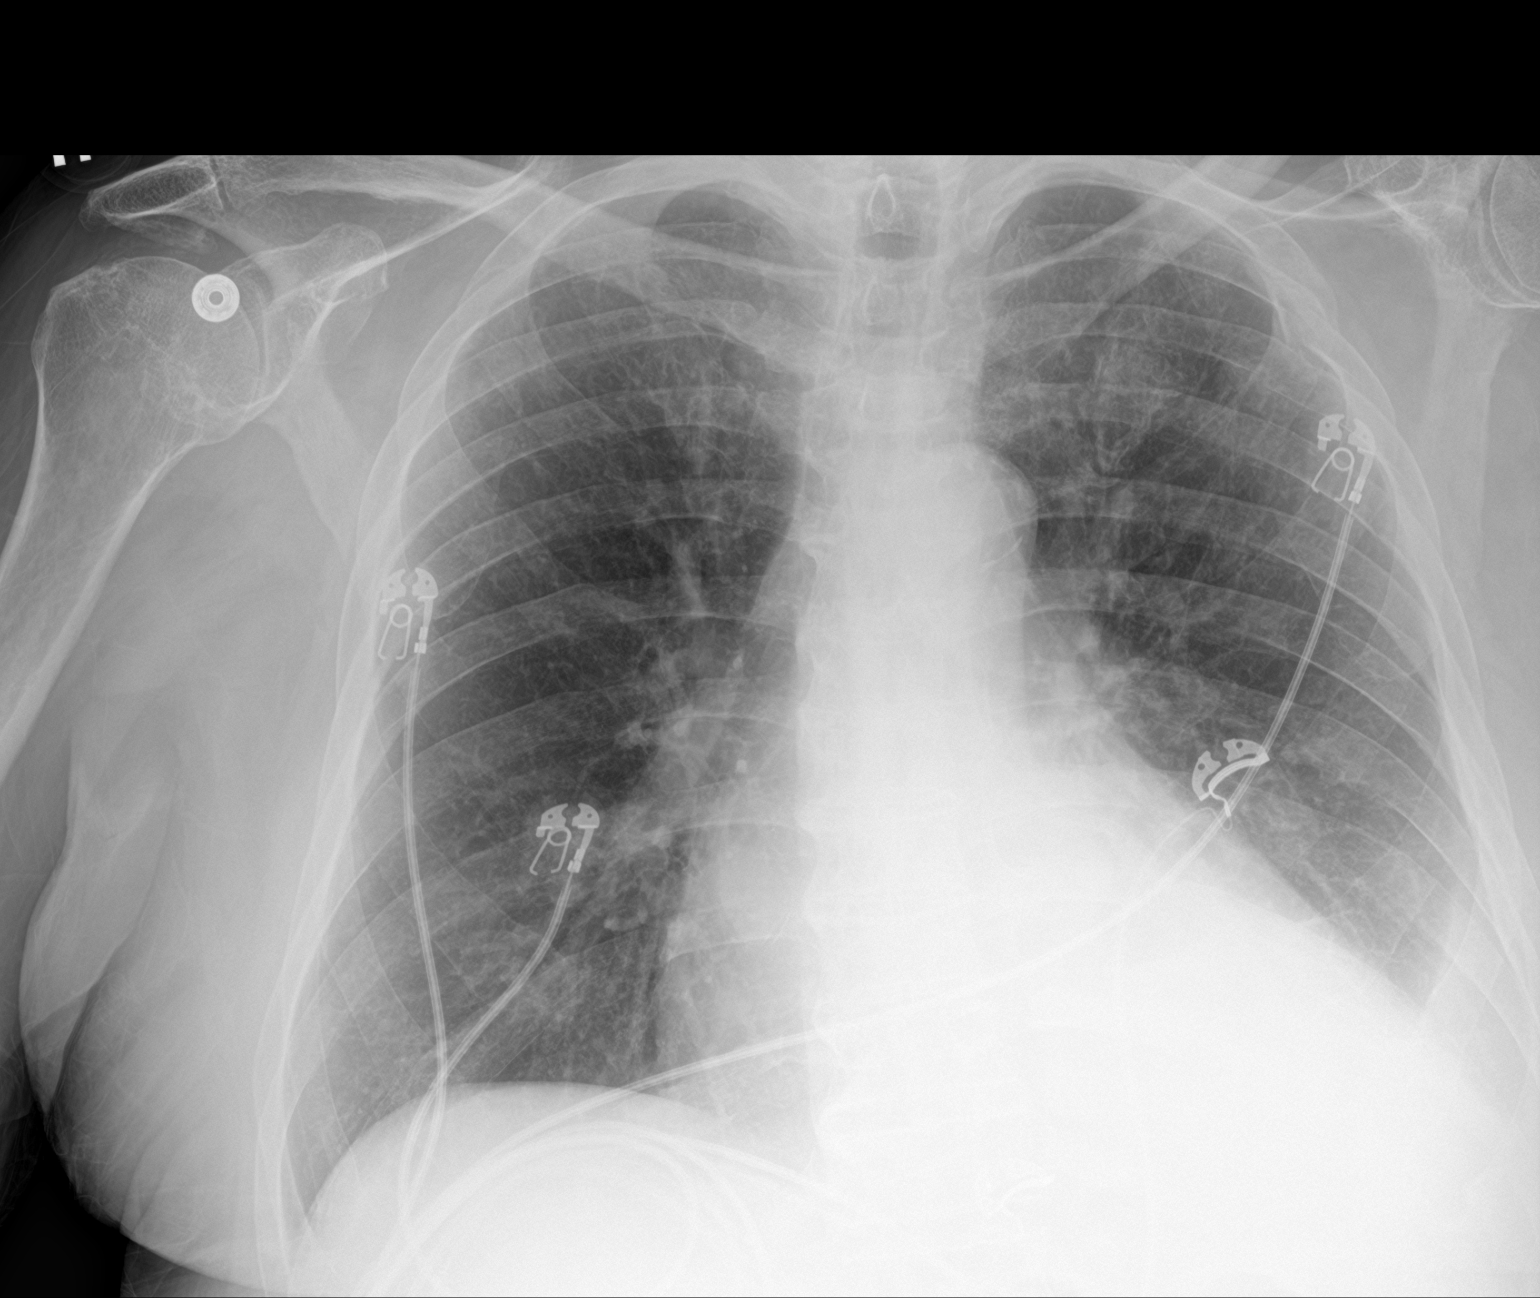

[2 of 2 positions shown; findings below may reference images not displayed]

FINDINGS: Left pleural effusion with left basilar atelectasis/consolidation.
Top-normal heart size. No acute osseous abnormality.
IMPRESSION: Left pleural effusion with left basilar atelectasis/consolidation.

## 2019-08-20 IMAGING — CT CT HEAD W/O CM
3 series · 15 of 47 positions shown, 18 images · non-contrast
Comparison: Head CT [DATE].

CLINICAL DATA: 86-year-old male with history of subdural hematoma
on recent CT examination. Follow-up examination.

EXAM:
CT HEAD WITHOUT CONTRAST
TECHNIQUE: Contiguous axial images were obtained from the base of the skull
through the vertex without intravenous contrast.

[Series 1: head 5.0 h30s · axial · 0.43mm/px · z∈[-121,+24]mm · 9 of 35 slices shown, 12 images]
[im 3/35  brain]
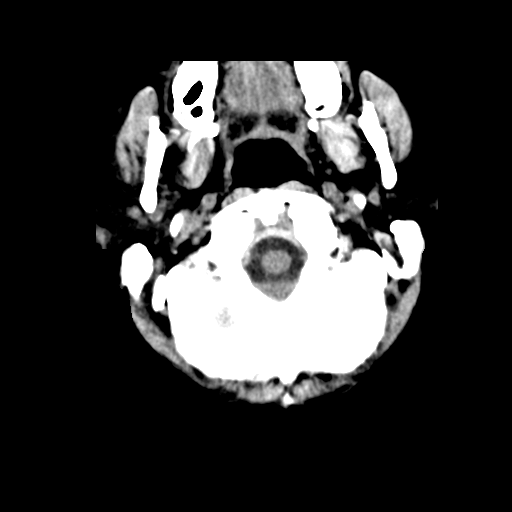
[im 3/35  bone]
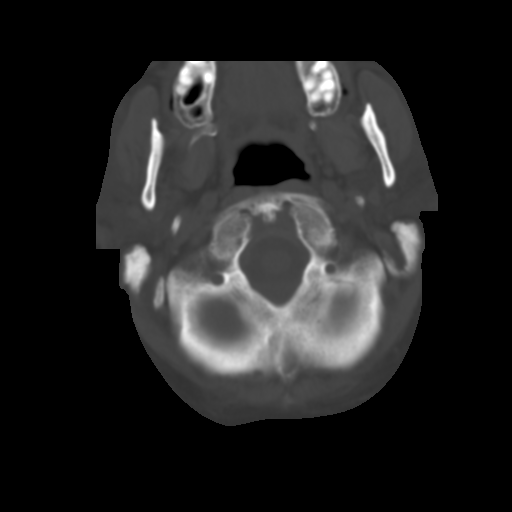
[im 6/35  brain]
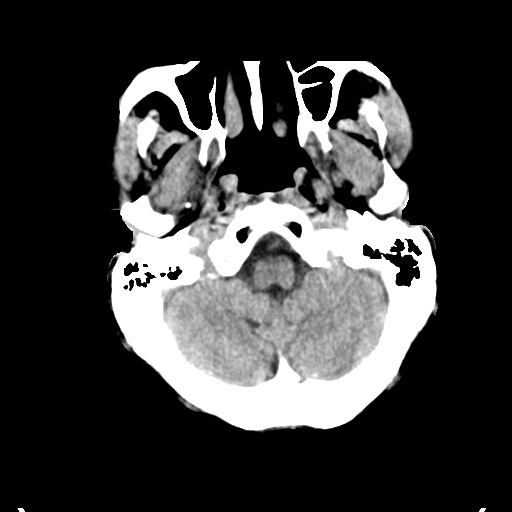
[im 10/35  brain]
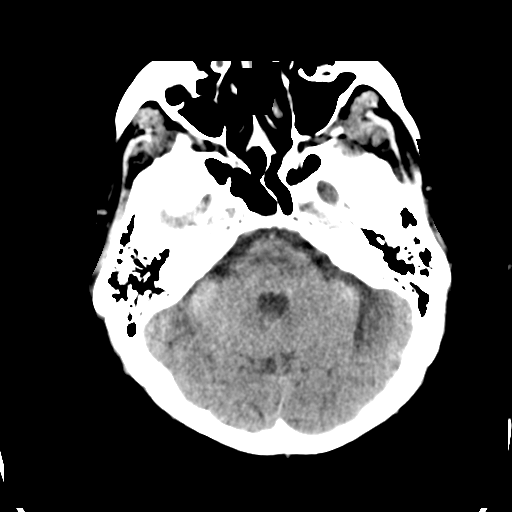
[im 13/35  brain]
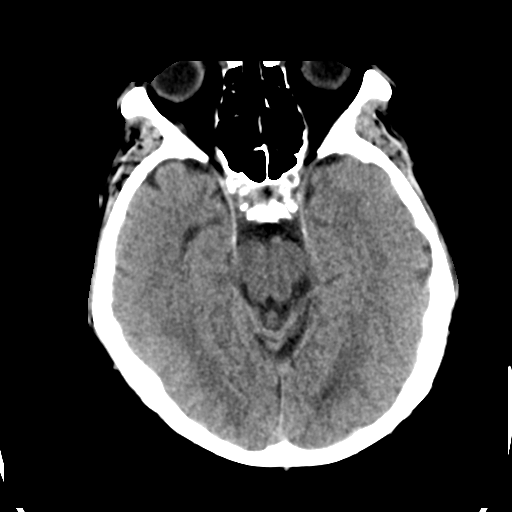
[im 18/35  brain]
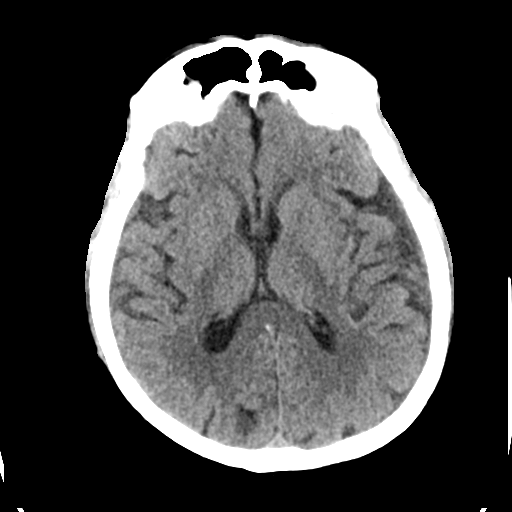
[im 18/35  bone]
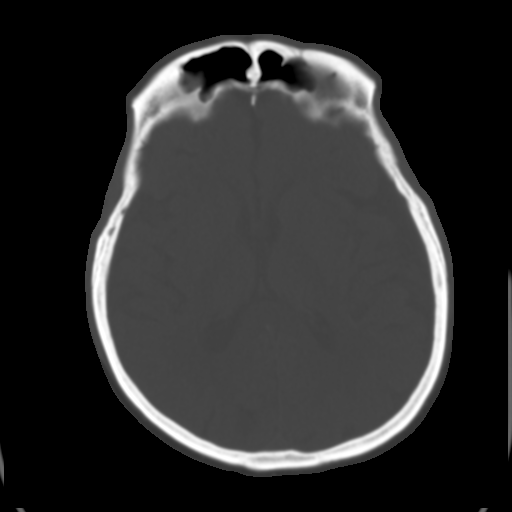
[im 22/35  brain]
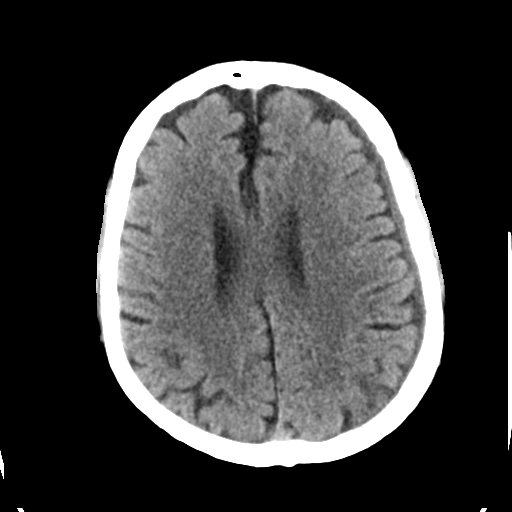
[im 25/35  brain]
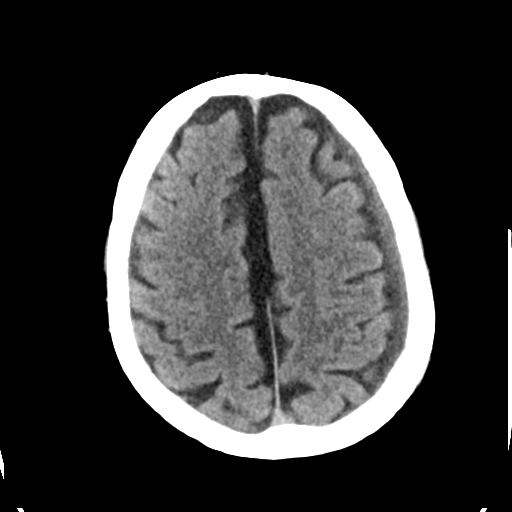
[im 29/35  brain]
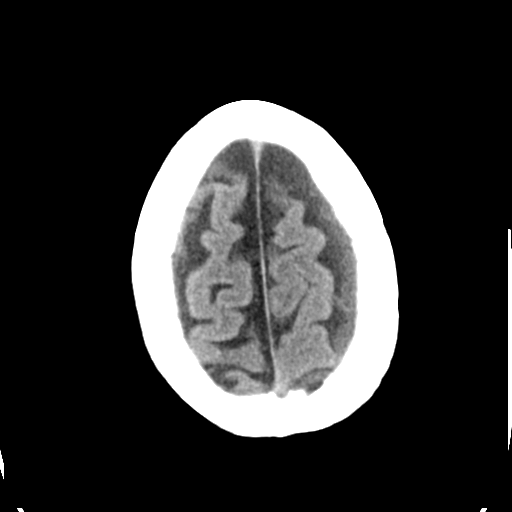
[im 32/35  brain]
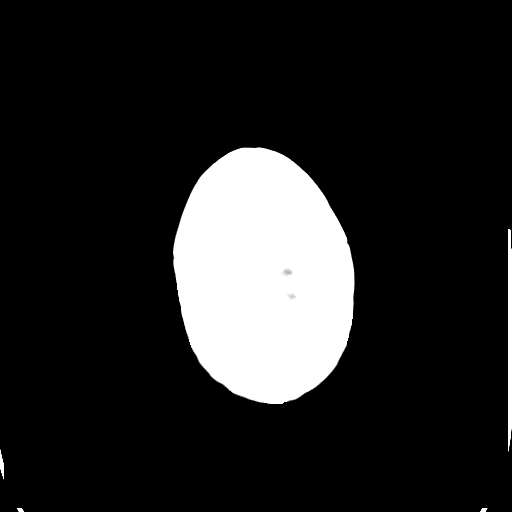
[im 32/35  bone]
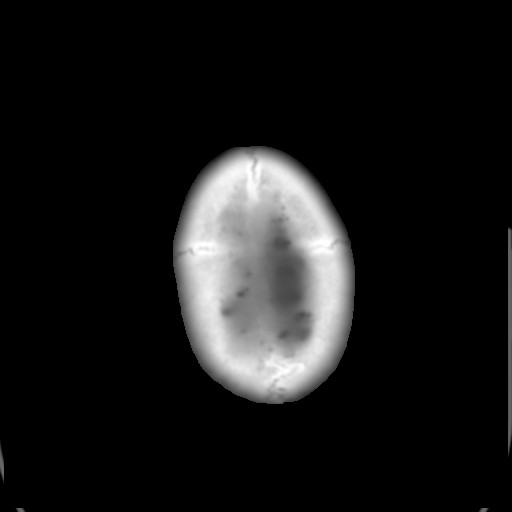

[Series 7: head 3.0 mpr cor · coronal · 0.34mm/px · 3 of 72 slices shown]
[im 24/72  brain]
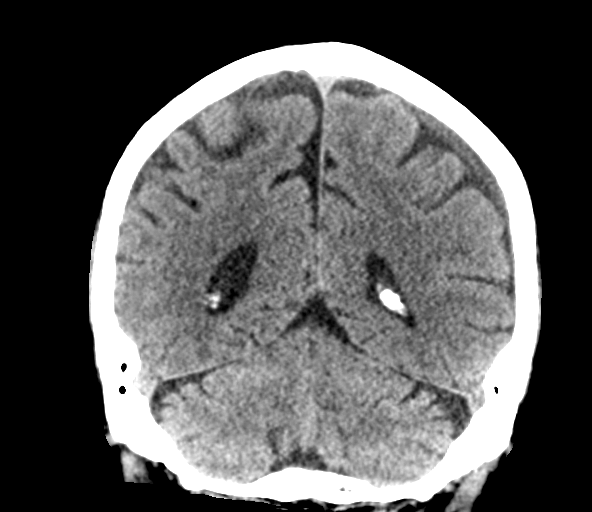
[im 32/72  brain]
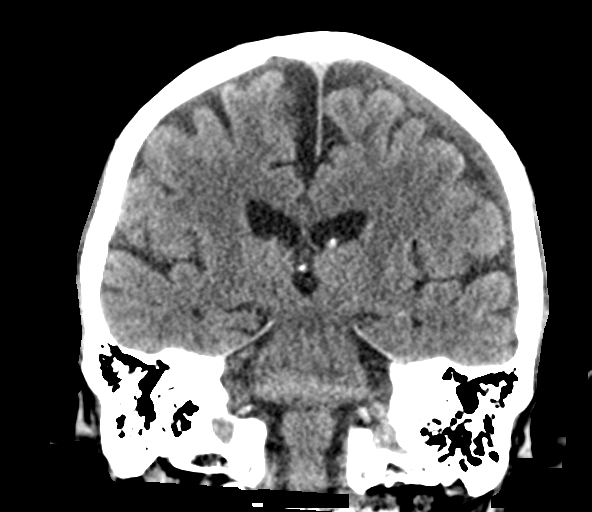
[im 40/72  brain]
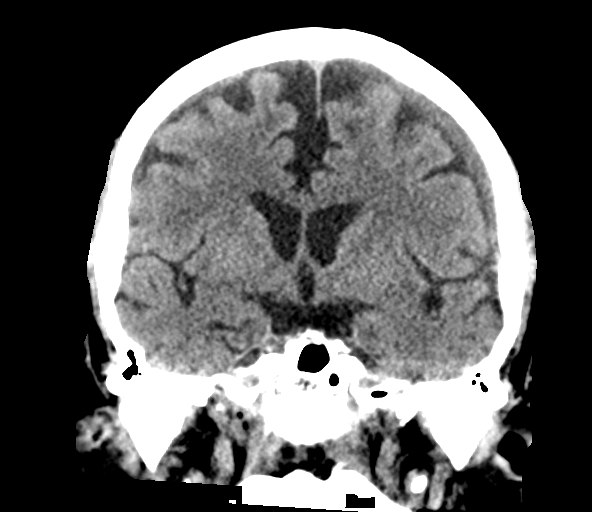

[Series 8: head 3.0 mpr sag · sagittal · 0.34mm/px · 3 of 63 slices shown]
[im 21/63  brain]
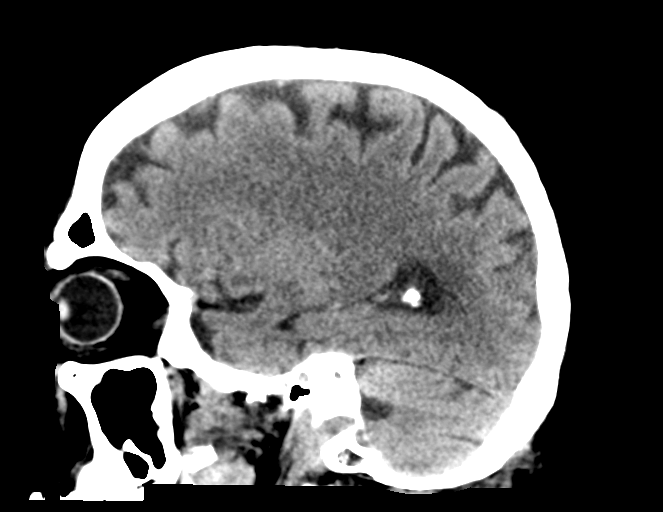
[im 32/63  brain]
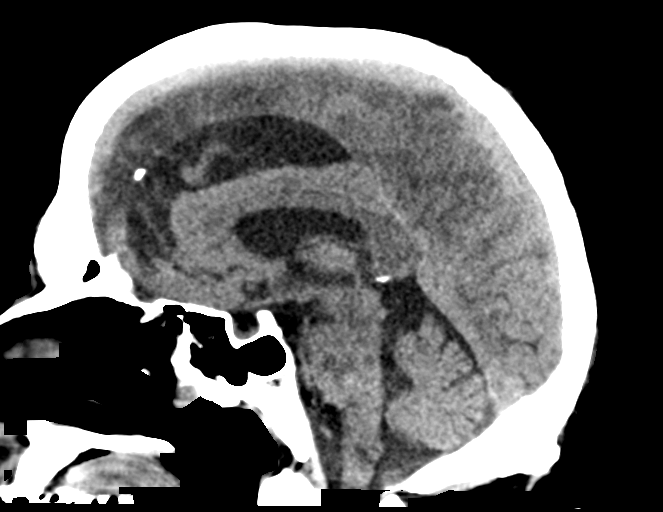
[im 42/63  brain]
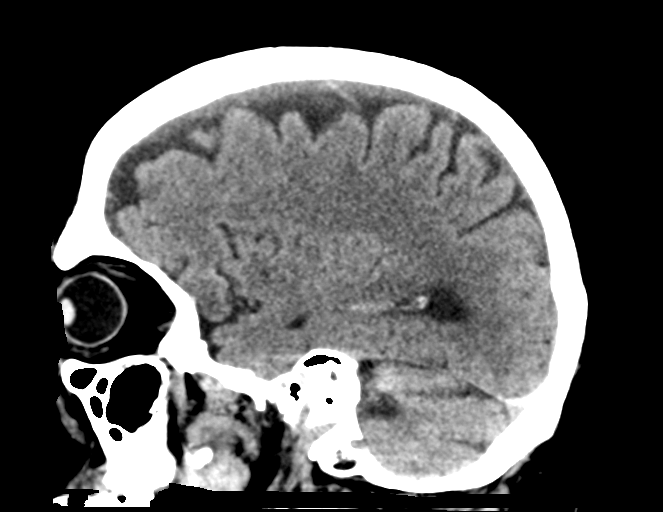

[15 of 47 positions shown; findings below may reference images not displayed]

FINDINGS: Brain: Again noted is an extra-axial intermediate attenuation
collection overlying the left cerebral hemisphere compatible with a
chronic left subdural hematoma, currently measuring up to 6 mm in
thickness, similar to the prior examination. Mild cerebral atrophy.
Patchy and confluent areas of decreased attenuation are noted
throughout the deep and periventricular white matter of the cerebral
hemispheres bilaterally, compatible with chronic microvascular
ischemic disease. No evidence of acute infarction, hemorrhage,
hydrocephalus or mass lesion/mass effect.

Vascular: No hyperdense vessel or unexpected calcification.

Skull: Normal. Negative for fracture or focal lesion.

Sinuses/Orbits: No acute finding.

Other: None.
IMPRESSION: 1. Stable left subdural hematoma, without significant mass effect or
midline shift.
2. Mild cerebral atrophy with chronic microvascular ischemic changes
in the cerebral white matter, as above.

## 2019-08-20 IMAGING — CT CT ABD-PELV W/O CM
2 of 4 series · 15 of 46 positions shown, 17 images · non-contrast
Comparison: [DATE]

CLINICAL DATA: Decreasing urine output.

EXAM:
CT ABDOMEN AND PELVIS WITHOUT CONTRAST
TECHNIQUE: Multidetector CT imaging of the abdomen and pelvis was performed
following the standard protocol without IV contrast.

[Series 5: abd/ pelvis 5.0 i30f 2 · axial · 0.91mm/px · z∈[-886,-451]mm · 12 of 97 slices shown, 14 images]
[im 5/97  soft-tissue]
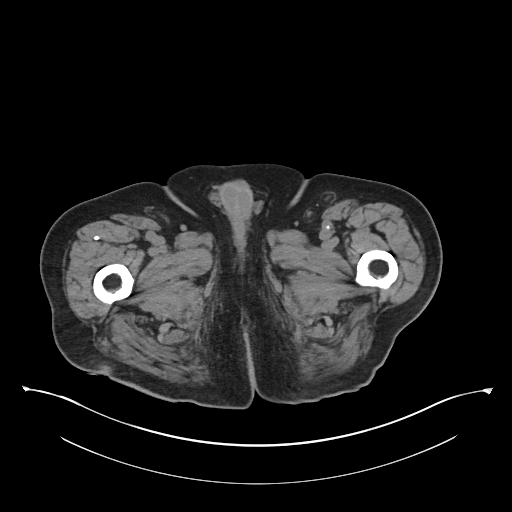
[im 5/97  bone]
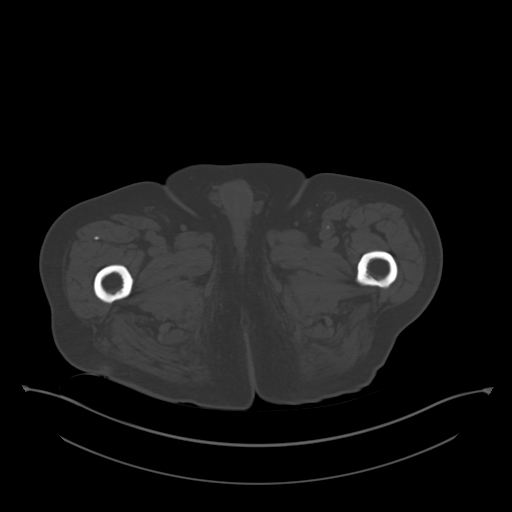
[im 13/97  soft-tissue]
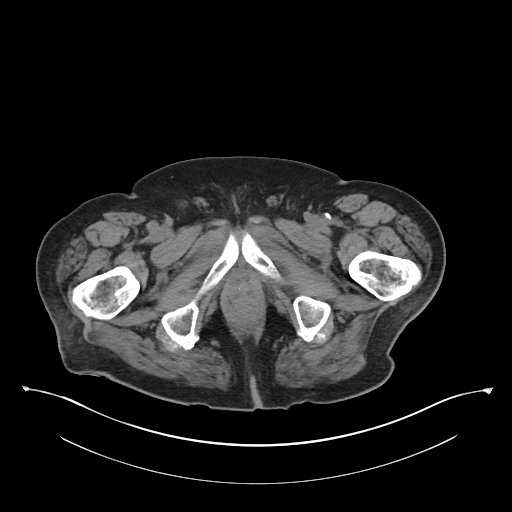
[im 21/97  soft-tissue]
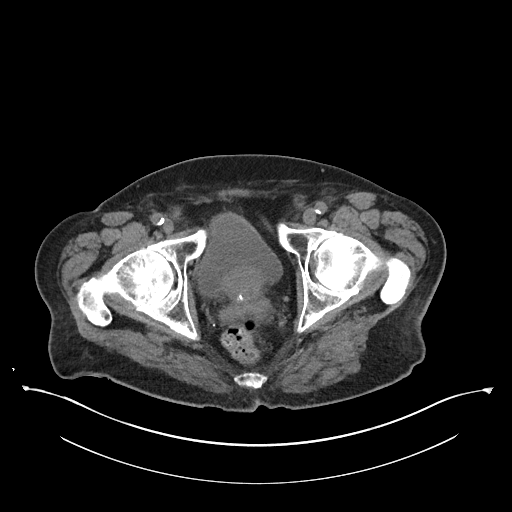
[im 30/97  soft-tissue]
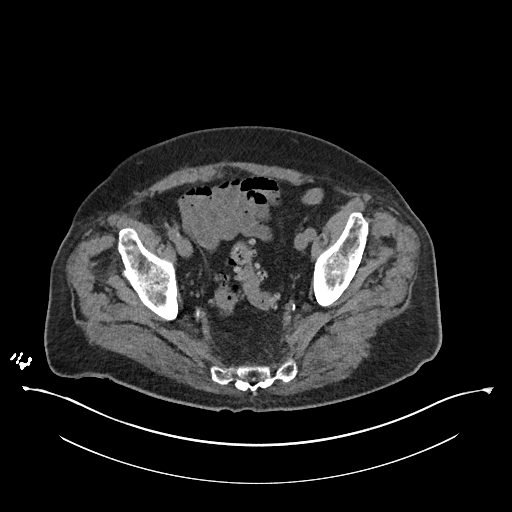
[im 38/97  soft-tissue]
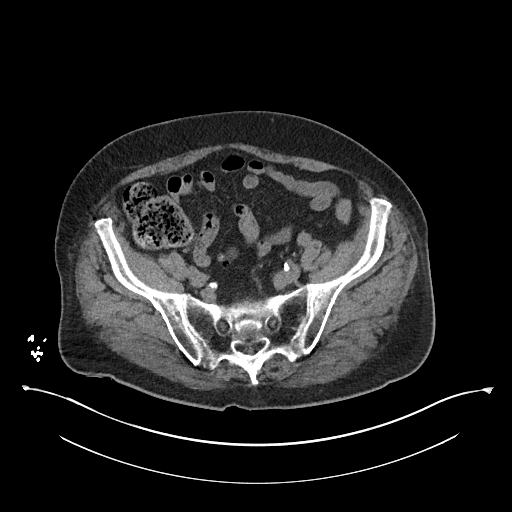
[im 46/97  soft-tissue]
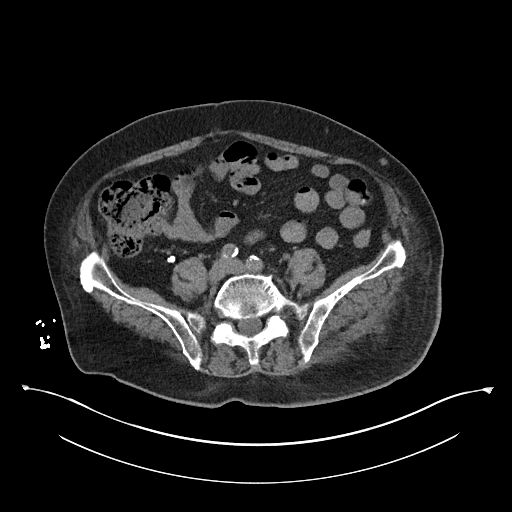
[im 51/97  soft-tissue]
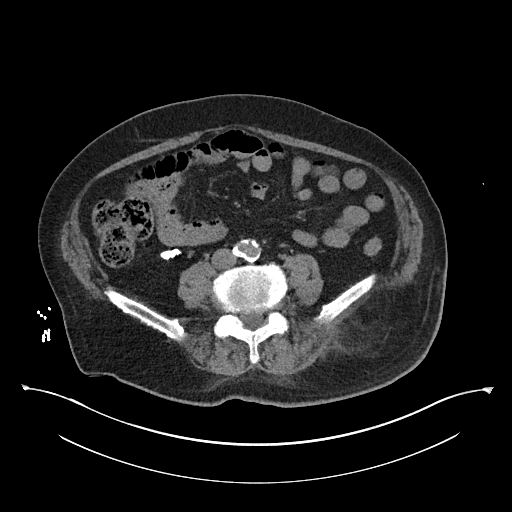
[im 59/97  soft-tissue]
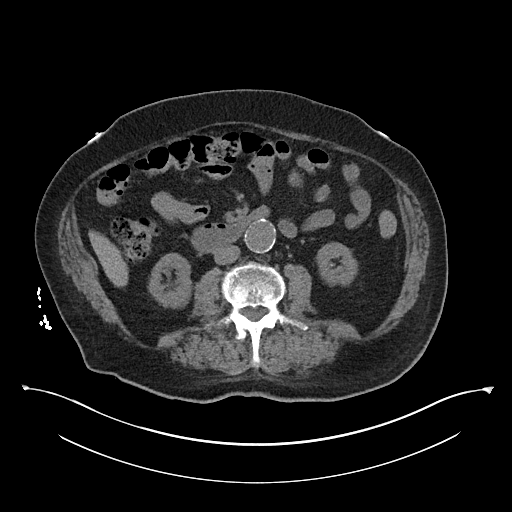
[im 67/97  soft-tissue]
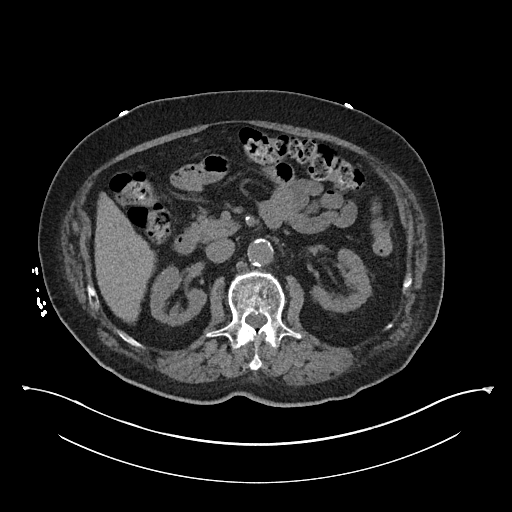
[im 67/97  bone]
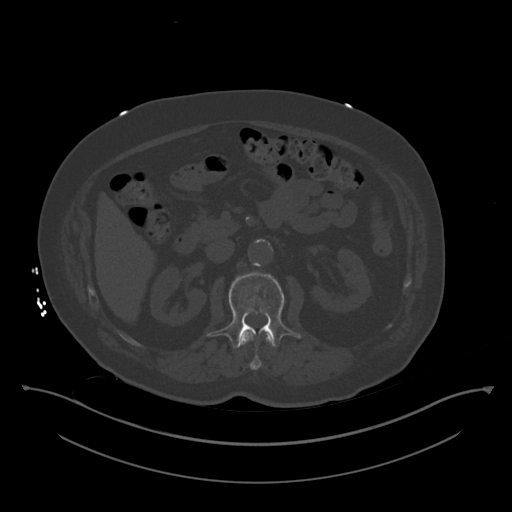
[im 76/97  soft-tissue]
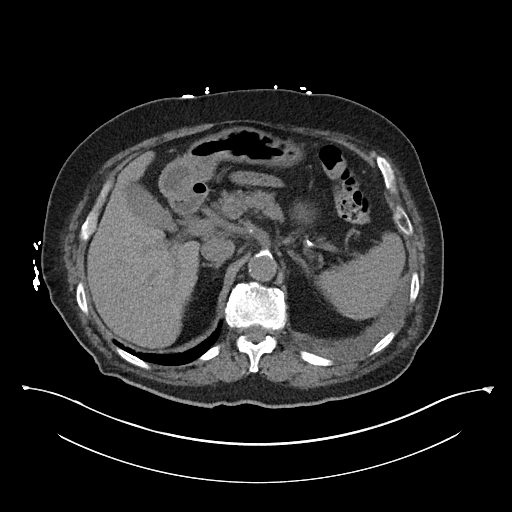
[im 84/97  soft-tissue]
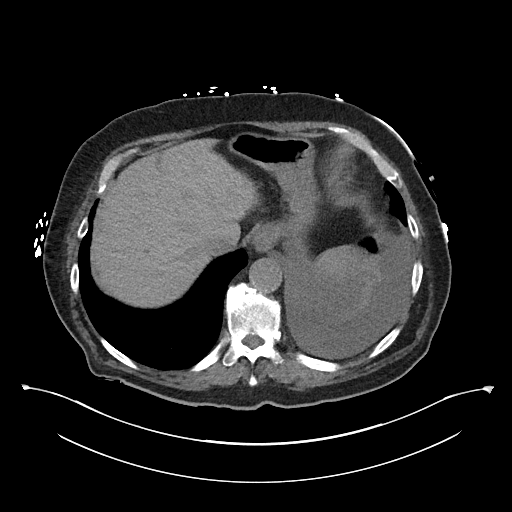
[im 92/97  soft-tissue]
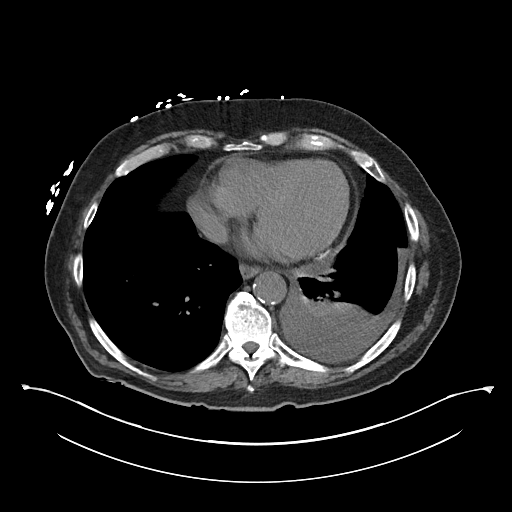

[Series 8: cor st · coronal · 0.83mm/px · 3 of 114 slices shown]
[im 38/114  soft-tissue]
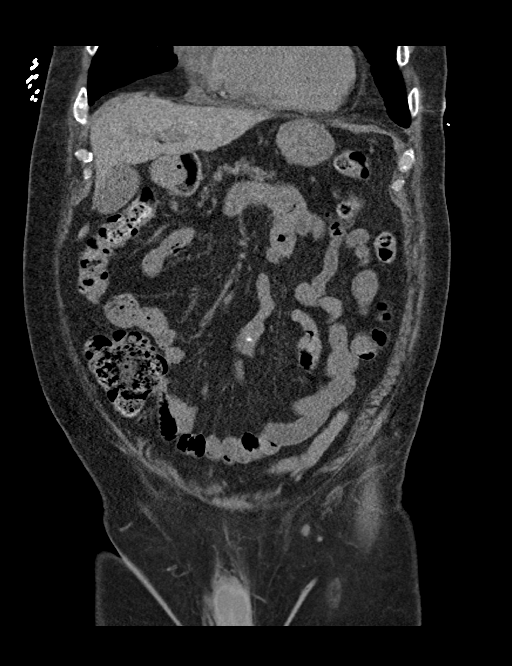
[im 51/114  soft-tissue]
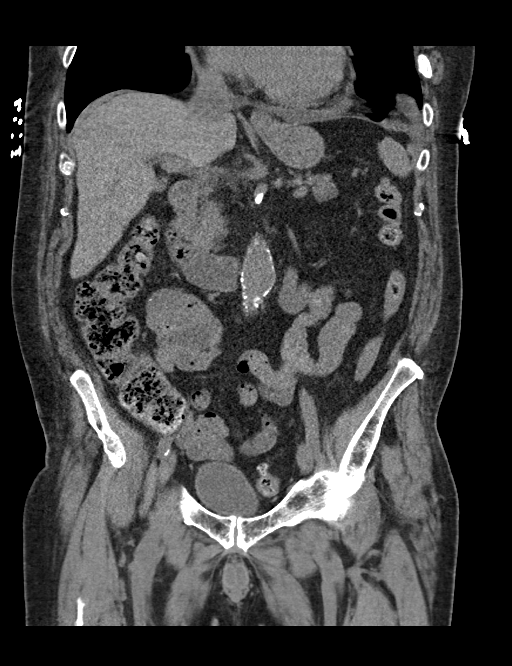
[im 63/114  soft-tissue]
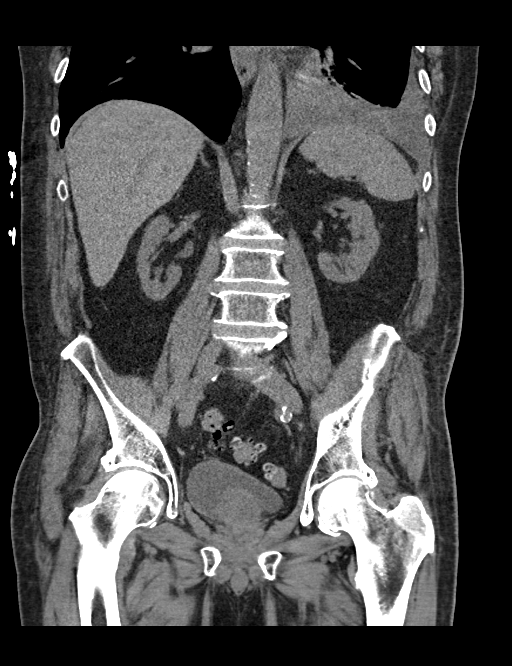

[15 of 46 positions shown; findings below may reference images not displayed]

FINDINGS: Lower chest: Left base collapse/consolidation with small to moderate
left pleural effusion, increased in the interval.

Hepatobiliary: No focal abnormality in the liver on this study
without intravenous contrast. There is no evidence for gallstones,
gallbladder wall thickening, or pericholecystic fluid. No
intrahepatic or extrahepatic biliary dilation.

Pancreas: 15 mm hypoattenuating lesion identified in the tail of
pancreas. No main duct dilatation.

Spleen: No splenomegaly. No focal mass lesion.

Adrenals/Urinary Tract: No adrenal nodule or mass. No obstructing
stone in either kidney. There may be a tiny 1 mm nonobstructing
stone in the lower pole left kidney (coronal 67/series 8). No
evidence for hydroureter. The urinary bladder appears normal for the
degree of distention.

Stomach/Bowel: Stomach is unremarkable. No gastric wall thickening.
No evidence of outlet obstruction. Duodenum is normally positioned
as is the ligament of Treitz. No small bowel wall thickening. No
small bowel dilatation. The terminal ileum is normal. The appendix
is not visualized, but there is no edema or inflammation in the
region of the cecum. No gross colonic mass. No colonic wall
thickening. Diverticular changes are noted in the left colon without
evidence of diverticulitis.

Vascular/Lymphatic: Atherosclerotic calcification noted abdominal
aorta which measures up to 3.1 cm diameter in the infrarenal
segment. There is no gastrohepatic or hepatoduodenal ligament
lymphadenopathy. No retroperitoneal or mesenteric lymphadenopathy.
No pelvic sidewall lymphadenopathy.

Reproductive: Prostate gland is enlarged. Seminal vesicles have
normal imaging features.

Other: No intraperitoneal free fluid.

Musculoskeletal: No worrisome lytic or sclerotic osseous
abnormality.
IMPRESSION: 1. No acute findings in the abdomen or pelvis.
2. Left base collapse/consolidation with small to moderate left
pleural effusion, increased in the interval.
3. 15 mm hypoattenuating lesion in the tail of pancreas, potentially
cystic. Nonemergent MRI of the abdomen without and with contrast
recommended to further evaluate. Non-emergent MRI should be deferred
until patient has been discharged for the acute illness, and can
optimally cooperate with positioning and breath-holding
instructions.
4. Abdominal aortic aneurysm measuring up to 3.1 cm diameter.
Recommend followup by ultrasound in 3 years. This recommendation
follows ACR consensus guidelines: White Paper of the ACR Incidental
5. Aortic Atherosclerosis ([M7]-[M7]).

## 2019-08-20 MED ORDER — DOXYCYCLINE HYCLATE 100 MG PO CAPS
100.0000 mg | ORAL_CAPSULE | Freq: Two times a day (BID) | ORAL | 0 refills | Status: DC
Start: 2019-08-20 — End: 2019-09-30

## 2019-08-20 MED ORDER — SODIUM CHLORIDE 0.9% FLUSH: 3.0000 mL | Freq: Once | INTRAVENOUS | Status: DC

## 2019-08-20 MED ORDER — SODIUM CHLORIDE 0.9 % IV BOLUS
1000.0000 mL | Freq: Once | INTRAVENOUS | Status: AC
  Administered 2019-08-20: 1000 mL via INTRAVENOUS

## 2019-08-20 MED ORDER — DOXYCYCLINE HYCLATE 100 MG PO TABS
100.0000 mg | ORAL_TABLET | Freq: Once | ORAL | Status: AC
  Administered 2019-08-20: 100 mg via ORAL
  Filled 2019-08-20: qty 1

## 2019-08-20 NOTE — ED Notes (Signed)
Taken to CT.

## 2019-08-20 NOTE — ED Notes (Signed)
Bladder scanner attempted, but not successful. MD was bedside

## 2019-08-20 NOTE — ED Provider Notes (Signed)
[facility] EMERGENCY DEPARTMENT Provider Note   CSN: [id number] Arrival date & time: 08/20/19  2330     History Chief Complaint  Patient presents with  . Altered Mental Status  . urinary complaint    [name] is a 86 y.o. male.  The history is provided by the patient, a relative and medical records. No language interpreter was used.  Altered Mental Status    86 year old male significant history of CAD, hypertension, prior MI, along with recent multiple trauma injury 6 weeks ago from a fall leading to intracranial bleed, clavicle fracture, rib fractures and femur fracture presenting for evaluation of altered mental status.  History obtained through patient and through daughter who is at bedside.  For the past 2 to 3 days patient has endorse intermittent urinary retention.  States it is difficult for him to urinate.  He mention making minimal amount of urine and daughter noticed swelling to his left flank that was very prominent yesterday which concerns her.  She also noticed that he has been exhibiting more confusion for the past few days.  He is more forgetful and more moody.  Patient has had some back pain.  States back pain is minimal at this time.  He has had recurrent headache but denies any headache at this time.  No report of fever or chills.  Denies nausea vomiting diarrhea or constipation.  He does endorse decrease in appetite.  Patient mention he was told that his kidney is impaired multiple times in the past.  He did reach out to urologist, Dr. Junious Silk regards to his urinary retention and was recommended to come to the ER for further evaluation.  Patient denies bowel bladder incontinence or leg weakness.  Past Medical History:  Diagnosis Date  . Allergy   . BPH (benign prostatic hyperplasia)   . BPH (benign prostatic hypertrophy)   . CAD (coronary artery disease)    s/p cypher DES to pLAD 6/08; normal LVF;  ETT-Myoview 2009: no ischemia   .  Coronary artery disease   . Eosinophilia   . High cholesterol   . HTN (hypertension)   . Hyperlipidemia   . Hypertension   . MI (myocardial infarction) ([place])   . Myocardial infarction ([place])   . Prediabetes   . Trigeminal neuralgia     Patient Active Problem List   Diagnosis Date Noted  . Fracture of multiple ribs with pain 08/10/2019  . Clavicle fracture 08/10/2019  . Closed displaced fracture of phalanx of left thumb, sequela 08/10/2019  . TBI (traumatic brain injury) ([place]) 07/23/2019  . Traumatic closed fracture of distal clavicle with minimal displacement, left, initial encounter 07/19/2019  . Traumatic brain injury with loss of consciousness ([place])   . Multiple trauma   . Benign prostatic hyperplasia   . Essential hypertension   . AKI (acute kidney injury) ([place])   . Stage 3b chronic kidney disease   . Prediabetes   . Acute blood loss anemia   . Thrombocytopenia ([place])   . Fall 07/14/2019  . Type 2 diabetes mellitus ([place])   . Eosinophilia   . Need for prophylactic vaccination and inoculation against influenza 11/12/2013  . Nocturnal leg cramps 11/23/2012  . Pain in joint, shoulder region 11/23/2012  . Fall 11/04/2012  . Hematoma 11/04/2012  . Acute upper respiratory infections of unspecified site 11/04/2012  . HYPERLIPIDEMIA TYPE IIB / III 03/08/2008  . Orthostatic hypotension 03/08/2008  . CAD, NATIVE VESSEL 03/08/2008    Past Surgical History:  Procedure Laterality Date  . APPENDECTOMY    . CARDIAC CATHETERIZATION  07/30/2006   CORONARY ANGIOPLASTY WITH STENT PLACEMENT  . CARDIAC CATHETERIZATION    . EXPLORATORY LAPAROTOMY     age 60  . EXPLORATORY LAPAROTOMY         Family History  Problem Relation Age of Onset  . Stomach cancer Mother   . Heart disease Father   . Heart disease Brother   . Aortic aneurysm Brother   . Colon cancer Brother   . Coronary artery disease Father 31  . Coronary artery disease Brother   . Aortic aneurysm Brother   . Colon  cancer Brother   . Esophageal cancer Neg Hx   . Rectal cancer Neg Hx   . Arthritis Daughter        rheumatoid    Social History   Tobacco Use  . Smoking status: Never Smoker  . Smokeless tobacco: Never Used  . Tobacco comment: quit over 75yrs ago.  Substance Use Topics  . Alcohol use: Never  . Drug use: Never    Home Medications Prior to Admission medications   Medication Sig Start Date End Date Taking? Authorizing Provider  albuterol (PROAIR HFA) 108 (90 Base) MCG/ACT inhaler INHALE 2 PUFFS INTO THE LUNGS EVERY 6 HOURS AS NEEDED FOR WHEEZING OR SHORTNESS OF BREATH 08/06/19   Angiulli, Lavon Paganini, PA-C  amitriptyline (ELAVIL) 10 MG tablet Take 1 tablet (10 mg total) by mouth at bedtime. 08/06/19   Angiulli, Lavon Paganini, PA-C  atorvastatin (LIPITOR) 40 MG tablet Take 40 mg by mouth daily.    [provider]  Calcium-Vitamin D 600-200 MG-UNIT tablet Take 1 tablet by mouth 2 (two) times daily. 08/06/19   Angiulli, Lavon Paganini, PA-C  Coenzyme Q-10 100 MG capsule Take 100 mg by mouth daily.     [provider]  doxazosin (CARDURA) 2 MG tablet Take 1 tablet (2 mg total) by mouth daily. 08/06/19   Angiulli, Lavon Paganini, PA-C  escitalopram (LEXAPRO) 10 MG tablet TAKE 1 TABLET BY MOUTH EVERY DAY 11/26/18   [name], MD  ezetimibe (ZETIA) 10 MG tablet Take 1 tablet (10 mg total) by mouth daily. Please make yearly appt with Dr. [name] for October for future refills. 1st attempt 08/03/19   Sherren Mocha, MD  fluticasone [facility]) 50 MCG/ACT nasal spray USE 2 SPRAYS INTO BOTH NOSTRILS DAILY. 02/26/17   [name], MD  fluticasone furoate-vilanterol (BREO ELLIPTA) 200-25 MCG/INH AEPB Inhale 1 puff into the lungs daily. 08/06/19   Angiulli, Lavon Paganini, PA-C  lidocaine (LIDODERM) 5 % Place 1 patch onto the skin daily. Remove & Discard patch within 12 hours or as directed by MD 08/06/19   Angiulli, Lavon Paganini, PA-C  melatonin 3 MG TABS tablet Take 1 tablet (3 mg total) by mouth at bedtime.  08/06/19   Angiulli, Lavon Paganini, PA-C  methocarbamol (ROBAXIN) 500 MG tablet Take 1 tablet (500 mg total) by mouth every 6 (six) hours as needed for muscle spasms. 08/06/19   Angiulli, Lavon Paganini, PA-C  Multiple Vitamin (MULTIVITAMIN WITH MINERALS) TABS tablet Take 1 tablet by mouth daily.    [provider]  NON FORMULARY Take 1 tablet by mouth 2 (two) times daily. Omega Q 1 tab twice daily     [provider]  polyethylene glycol (MIRALAX / GLYCOLAX) 17 g packet Take 17 g by mouth daily. 08/06/19   Angiulli, Lavon Paganini, PA-C  predniSONE (DELTASONE) 20 MG tablet Take 1 tablet (20  mg total) by mouth 2 (two) times daily. 08/18/19   Daleen Bo, MD  traMADol (ULTRAM) 50 MG tablet Take 1 tablet (50 mg total) by mouth every 6 (six) hours as needed. 08/06/19   Angiulli, Lavon Paganini, PA-C  WIXELA INHUB 250-50 MCG/DOSE AEPB INHALE 1 PUFF BY MOUTH TWICE A DAY 06/08/19   [name], MD    Allergies    Gabapentin  Review of Systems   Review of Systems  All other systems reviewed and are negative.   Physical Exam Updated Vital Signs BP (!) 157/66 (BP Location: Left Arm)   Pulse 100   Temp 98.1 F (36.7 C) (Oral)   Resp 14   Ht 5' 7.5" (1.715 m)   Wt 78.9 kg   SpO2 96%   BMI 26.85 kg/m   Physical Exam Vitals and nursing note reviewed.  Constitutional:      General: He is not in acute distress.    Appearance: He is well-developed.  HENT:     Head: Normocephalic and atraumatic.  Eyes:     Extraocular Movements: Extraocular movements intact.     Conjunctiva/sclera: Conjunctivae normal.     Pupils: Pupils are equal, round, and reactive to light.  Cardiovascular:     Rate and Rhythm: Normal rate and regular rhythm.     Pulses: Normal pulses.     Heart sounds: Normal heart sounds.  Pulmonary:     Effort: Pulmonary effort is normal.     Breath sounds: Normal breath sounds.     Comments: Decreased breath sounds left-sided chest. Abdominal:     Palpations: Abdomen is soft.      Tenderness: There is no abdominal tenderness.  Musculoskeletal:     Cervical back: Neck supple.     Comments: 5/5 strength to right lower extremity, 4/5 strength to left lower extremity  No significant midline spine tenderness. No significant paraspinal tenderness.  No ecchymosis noted to left flank.  Skin:    Findings: No rash.  Neurological:     Mental Status: He is alert.     GCS: GCS eye subscore is 4. GCS verbal subscore is 5. GCS motor subscore is 6.     Cranial Nerves: Cranial nerves are intact.     Sensory: Sensation is intact.     Comments: Alert to self place and situation but thought it was June instead of July, and thought it was 2061 instead of 2021.  Was able to recall current president.      ED Results / Procedures / Treatments   Labs (all labs ordered are listed, but only abnormal results are displayed) Labs Reviewed  COMPREHENSIVE METABOLIC PANEL - Abnormal; Notable for the following components:      Result Value   Glucose, Bld 196 (*)    BUN 32 (*)    Creatinine, Ser 1.62 (*)    Albumin 3.1 (*)    AST 50 (*)    ALT 53 (*)    Alkaline Phosphatase 201 (*)    GFR calc non Af Amer 38 (*)    GFR calc Af Amer 45 (*)    All other components within normal limits  CBC - Abnormal; Notable for the following components:   WBC 15.0 (*)    RBC 3.29 (*)    Hemoglobin 10.7 (*)    HCT 32.9 (*)    All other components within normal limits  URINALYSIS, ROUTINE W REFLEX MICROSCOPIC - Abnormal; Notable for the following components:   APPearance HAZY (*)  Protein, ur 30 (*)    Bacteria, UA RARE (*)    All other components within normal limits  NA AND K (SODIUM & POTASSIUM), RAND UR  CREATININE, URINE, RANDOM    EKG None  Radiology CT HEAD WO CONTRAST  Result Date: 08/18/2019 CLINICAL DATA:  86 year old male with head trauma. EXAM: CT HEAD WITHOUT CONTRAST TECHNIQUE: Contiguous axial images were obtained from the base of the skull through the vertex without  intravenous contrast. COMPARISON:  Head CT dated 07/14/2019. FINDINGS: Brain: There is a left hemispheric subdural hemorrhage new since the prior CT and measuring approximately 8 mm in greatest thickness. This may be acute or subacute. No significant mass effect. No midline shift. There is moderate age-related atrophy and chronic microvascular ischemic changes. Vascular: No hyperdense vessel or unexpected calcification. Skull: Normal. Negative for fracture or focal lesion. Sinuses/Orbits: No acute finding. Other: None IMPRESSION: 1. Left hemispheric subdural hemorrhage, new since the prior CT. No significant mass effect or midline shift. 2. Moderate age-related atrophy and chronic microvascular ischemic changes. These results were called by telephone at the time of interpretation on 08/18/2019 at 9:35 pm to Dr. Eulis Foster, who verbally acknowledged these results. Electronically Signed   By: Anner Crete M.D.   On: 08/18/2019 21:39    Procedures Procedures (including critical care time)  Medications Ordered in ED Medications  sodium chloride flush (NS) 0.9 % injection 3 mL (has no administration in time range)  doxycycline (VIBRA-TABS) tablet 100 mg (has no administration in time range)  sodium chloride 0.9 % bolus 1,000 mL (1,000 mLs Intravenous New Bag/Given 08/20/19 1107)    ED Course  I have reviewed the triage vital signs and the nursing notes.  Pertinent labs & imaging results that were available during my care of the patient were reviewed by me and considered in my medical decision making (see chart for details).  Clinical Course as of Aug 19 1336  Thu Aug 20, 2019  1048 86 yo male w/ hx of recent traumatic fall c/b subdural hematoma, multiple broken ribs, large left sided hematoma, presenting to the ED with night sweats and poor urine output for the past 2 days.  Patient was seen in our ED on 7/13 for weakness and had a UA at the time, negative leuks& nitrites, CTH with 8 mm subdural bleed  noted - presumably stable from prior imaging, or with no significant change.  He was discharged home on prednisone which he has taken for 2 days.  His wife reports he was "drenched in sweat" last night and they feel he has hardly produced urine at all in the past day.  He has no abdominal or flank pain.  She reported noting another hematoma collection (firm mass) on his left flank last night but it has resolved.  He denies headache.  Otherwise normal exam here, aside from some limitations in left hip flexion from his prior injuries.  Labs show Cr 1.6 (mild increase from prior) with BUN 32, prerenal pattern.  CBC with WBC 15, perhaps due to steroid use.  UA pending.  Bladder scan with only 45 cc urine noted.  Plan for [facility] and CT abdomen to reassess ICH and evaluate for obstructive pathology regarding kidneys.  We'll also give IVF one liter here if he may be dry.   [MT]  1052 Hgb 10.7, stable from 2 days ago (10.8) and improved from the time of his last hospital discharge (hgb 8)   [MT]  1119 Left subdural hematoma is stable  today, measuring approx 6 mm.   [MT]  34 FENA 0.1%, suggestive of prerenal etiology   [MT]  1335 On reassessment pt is now producing urine after IVF.  I suspect he may have been dehydrated.  I discussed mgmt options with him and his wife.  It's reasonable to treat for PNA given his rib fractures and reported hx of night sweats, and atelactasis in the LLL seen on CT scan.  We'll do 7 days of doxycycline.  I advised STOPPING the amitripyline that was started in the hospital 2 weeks ago and STOPPING the prednisone started 2 days ago, as these may be contributing to sundowning and confusion, and don't seem to have a clear indication at this time.  He has a neurology appointment tomorrow.  This is good follow up.  I advised to continue aggressively drinking water until his urine appears clear.  He and his wife verbalized understanding and are happy with this plan of action.   [MT]      Clinical Course User Index [MT] Trifan, Carola Rhine, MD   MDM Rules/Calculators/A&P                          BP (!) 157/66 (BP Location: Left Arm)   Pulse 100   Temp 98.1 F (36.7 C) (Oral)   Resp 14   Ht 5' 7.5" (1.715 m)   Wt 78.9 kg   SpO2 96%   BMI 26.85 kg/m   Final Clinical Impression(s) / ED Diagnoses Final diagnoses:  Confusion  Pleural effusion on left  Abnormal abdominal CT scan    Rx / DC Orders ED Discharge Orders         Ordered    doxycycline (VIBRAMYCIN) 100 MG capsule  2 times daily     Discontinue  Reprint     08/20/19 1336         9:46 AM Patient had a bad fall 6 weeks ago with subsequent head trauma, left clavicle fracture, multiple rib fractures, as well as left wrist fracture.  He is here today with complaints of urinary retention as well as increased confusion.  He was seen 2 days ago for complaints of low back pain fever and headache.  Recently had CT scan obtained during that visit, demonstrating a new left hemispheric subdural hemorrhage.  Neurosurgery was consulted and felt no immediate intervention necessary.  He does have some confusion according to his daughter which may be secondary to his head injury.  With his urinary retention, as well as weakness to left lower extremity I am concerned for potential spinal cord injury.  Will consider MRI of the lumbar spine. Will order bladder scan.   1:22 PM Bladder scan revealed 46 cc of urine.  This is not likely consistence with urinary retention.  UA obtained showed no signs of urinary tract infection.  Some evidence of AKI with BUN 32, creatinine 1.62, patient received IV fluid.  White count is elevated 15 however this is not likely due to infection but more likely due to recent prednisone that was prescribed 2 days ago.  X-ray shows left pleural effusion with left basilar atelectasis/consolidation.  In the setting of broken ribs I suspect pleural effusion is secondary to his prior trauma.  He does not  exhibit any hypoxia.  Repeat head CT scan showing a stable left subdural hematoma without significant mass-effect or midline shift.  An abdominal pelvis CT scan obtained showed no acute finding.  Incidentally there  is a 3.1 cm AAA.  Recommend ultrasound in 3 years.  There is also a 50 mm lesion in the tail of pancreas likely cystic.  Recommend nonemergent MRI of the abdomen with and without contrast.  I did discussed with Dr. Langston Masker in regard to pt's left leg weekness.  It was felt that this is 2/2 pain and not true weakness.  Pt able to urinate.  Therefore, doubt caudal equina sxs.  Lumbar MRI not indicated at this time.  1:31 PM Pt has neurology appointment tomorrow and neurosurgeon next week.  Will d/c home with doxycycline and recommend stopping amitriptyline and prednisone.  Return precaution discussed. Suspect prednisone may contribute to the mood instability that pt and daughter have experienced.     Domenic Moras, PA-C 08/20/19 1338    Wyvonnia Dusky, MD 08/20/19 [phone]

## 2019-08-20 NOTE — Discharge Instructions (Signed)
Please take doxycycline antibiotic as prescribed for the next week.  This can be used to treat a potential lung infection as there are fluid in your left lung.  Please stop taking amitriptyline and prednisone as it may contribute some of your mood changes.  Follow-up with your neurologist appointment tomorrow and neurosurgery appointment next week as previously scheduled.  Return if you have any concern.  Please note incidentally CT scan of the abdomen pelvis demonstrates a 3.1 cm abdominal aortic aneurysm.  It is recommended for you to have an abdominal ultrasound in 3 years for close monitoring.  Another incidental finding is a lesion in the tail of your pancreas.  This is likely a cyst.  It is recommended for you to have a nonemergent MRI of the abdomen for further classification.  Discussed this with your primary care doctor who will help you set up the appropriate appointments.

## 2019-08-20 NOTE — ED Triage Notes (Addendum)
Wife reports pt only urinated 1 time last night and normally urinates approx 5 times per night.  States she felt a hard lump on L flank area that has since resolved.  Also concerned about increased confusion and states pt is being confrontational and is usually not.  Reports SDH from fall 6 weeks ago.  States pt is stating things like that he had surgery and has not.

## 2019-08-21 DIAGNOSIS — N183 Chronic kidney disease, stage 3 unspecified: Secondary | ICD-10-CM | POA: Diagnosis not present

## 2019-08-21 DIAGNOSIS — N401 Enlarged prostate with lower urinary tract symptoms: Secondary | ICD-10-CM | POA: Diagnosis not present

## 2019-08-21 DIAGNOSIS — R3912 Poor urinary stream: Secondary | ICD-10-CM | POA: Diagnosis not present

## 2019-08-21 DIAGNOSIS — R351 Nocturia: Secondary | ICD-10-CM | POA: Diagnosis not present

## 2019-08-24 ENCOUNTER — Ambulatory Visit: Payer: PPO | Admitting: Occupational Therapy

## 2019-08-24 ENCOUNTER — Ambulatory Visit: Payer: PPO | Admitting: Physical Therapy

## 2019-08-24 ENCOUNTER — Encounter: Payer: Self-pay | Admitting: Physical Therapy

## 2019-08-24 ENCOUNTER — Other Ambulatory Visit: Payer: Self-pay

## 2019-08-24 DIAGNOSIS — M25642 Stiffness of left hand, not elsewhere classified: Secondary | ICD-10-CM

## 2019-08-24 DIAGNOSIS — M25612 Stiffness of left shoulder, not elsewhere classified: Secondary | ICD-10-CM

## 2019-08-24 DIAGNOSIS — R2681 Unsteadiness on feet: Secondary | ICD-10-CM | POA: Diagnosis not present

## 2019-08-24 DIAGNOSIS — M6281 Muscle weakness (generalized): Secondary | ICD-10-CM

## 2019-08-24 DIAGNOSIS — R262 Difficulty in walking, not elsewhere classified: Secondary | ICD-10-CM

## 2019-08-24 DIAGNOSIS — R2689 Other abnormalities of gait and mobility: Secondary | ICD-10-CM

## 2019-08-24 DIAGNOSIS — R29818 Other symptoms and signs involving the nervous system: Secondary | ICD-10-CM

## 2019-08-24 DIAGNOSIS — M25632 Stiffness of left wrist, not elsewhere classified: Secondary | ICD-10-CM

## 2019-08-24 NOTE — Therapy (Signed)
[facility] [address] [place], [place], [address] Phone: [phone]   Fax:  [phone]  Occupational Therapy Treatment  Patient Details  Name: [name] MRN: [id number] Date of Birth: March 11, 1933 Referring Provider (OT): Reesa Chew   Encounter Date: 08/24/2019   OT End of Session - 08/24/19 1344    Visit Number 2    Number of Visits 17    Date for OT Re-Evaluation 10/27/19    Authorization Type [employer] - Follow Medicare guidelines    Progress Note Due on Visit 10    OT Start Time 1108    OT Stop Time 1150    OT Time Calculation (min) 42 min    Activity Tolerance Patient tolerated treatment well    Behavior During Therapy [facility] for tasks assessed/performed;Impulsive           Past Medical History:  Diagnosis Date  . Allergy   . BPH (benign prostatic hyperplasia)   . BPH (benign prostatic hypertrophy)   . CAD (coronary artery disease)    s/p cypher DES to pLAD 6/08; normal LVF;  ETT-Myoview 2009: no ischemia   . Coronary artery disease   . Eosinophilia   . High cholesterol   . HTN (hypertension)   . Hyperlipidemia   . Hypertension   . MI (myocardial infarction) ([place])   . Myocardial infarction ([place])   . Prediabetes   . Trigeminal neuralgia     Past Surgical History:  Procedure Laterality Date  . APPENDECTOMY    . CARDIAC CATHETERIZATION  07/30/2006   CORONARY ANGIOPLASTY WITH STENT PLACEMENT  . CARDIAC CATHETERIZATION    . EXPLORATORY LAPAROTOMY     age 86  . EXPLORATORY LAPAROTOMY      There were no vitals filed for this visit.   Subjective Assessment - 08/24/19 1110    Subjective  Pt reports pain Lt lower back when breathing in (most likely due to rib fx's). Pt has also been to ED x 2 since evaluation    Patient is accompanied by: Family member   wife   Limitations NWB LUE, Thumb splint    Currently in Pain? Yes   Hurts when I breathe in - O.T. not addressing due to outisde  scope of practice   Pain Location Back    Pain Orientation Left;Lower          Pt shown thumb A/ROM ex's including: thumb flexion, radial abduction, opposition (pt could do to 4th digit), and circumduction ex's. Pt/wife report only doing 1x/day but appears stiff and therefore recommended 3-4x/day. Note sent via pt/wife to MD to ask if we may progress to P/ROM and wean from current splint or reduce down to neoprene style splint.  Pt also shown shoulder ex's including: self guided sh flexion seated w/ Rt hand guiding LUE and bilateral sh flexion in supine (palms facing) using thick foam - pt instructed to use shoe box or paper towel roll at home. Also reviewed/clarified ex's provided from CIR - especially with ER. Pt needed cueing t/o session d/t memory deficits. Pt also required cueing to not wt bear through LUE.                         OT Short Term Goals - 08/13/19 1357      OT SHORT TERM GOAL #1   Title Patient will complete an HEP designed to improve left thumb range of motion with min cueing due 09/27/19  Time 4    Period Weeks    Status New    Target Date 09/27/19      OT SHORT TERM GOAL #2   Title Patient will complete a home exercise program designed to improve coordination in left hand with set up assistance    Time 4    Period Weeks    Status New      OT SHORT TERM GOAL #3   Title Patient will don/doff a pull over shirt with set up assistance    Time 4    Period Weeks    Status New      OT SHORT TERM GOAL #4   Title Patient will demonstrate low reach to obtain a lightweight (less than 3 lb) object with left hand    Time 4    Period Weeks    Status New             OT Long Term Goals - 08/13/19 1401      OT LONG TERM GOAL #1   Title Patient will complete HEP/Home activities program to improve functional mobility and use of left hand due 9/21    Time 8    Period Weeks    Status New    Target Date 10/27/19      OT LONG TERM GOAL #2    Title Patient will bathe and dress himself with modified independence - with increased time    Time 8    Period Weeks    Status New      OT LONG TERM GOAL #3   Title Patient will prepare a familiar hot meal using left hand as non dominant with supervision    Time 8    Period Weeks    Status New      OT LONG TERM GOAL #4   Title Patient will demonstrate ability to manipulate small items within left hand as evidenced by his ability to complete 9 hole peg test in less than 1 minute    Time 8    Period Weeks    Status New      OT LONG TERM GOAL #5   Title Patient will understand recommendations related to driving    Time 8    Period Weeks    Status New                 Plan - 08/24/19 1345    Clinical Impression Statement Pt needs cueing to adhere to NWB LUE as pt would often try and push up from LUE to stand. Pt stiff Lt thumb. Pt/wife reports MD appointment next Monday with orthopedist - sent note re: progression of Lt thumb    Occupational performance deficits (Please refer to evaluation for details): ADL's;IADL's;Leisure;Rest and Sleep    Body Structure / Function / Physical Skills ADL;Coordination;Endurance;GMC;UE functional use;Sensation;Decreased knowledge of precautions;Balance;Body mechanics;Decreased knowledge of use of DME;Flexibility;IADL;Pain;Strength;FMC;Dexterity;Cardiopulmonary status limiting activity;ROM;Mobility;Tone    Cognitive Skills Attention;Thought;Problem Solve;Safety Awareness;Memory;Energy/Drive    Rehab Potential Excellent    OT Frequency 2x / week    OT Duration 8 weeks    OT Treatment/Interventions Self-care/ADL training;Electrical Stimulation;Iontophoresis;Therapeutic exercise;Moist Heat;Paraffin;Neuromuscular education;Splinting;Patient/family education;Balance training;Therapeutic activities;Functional Mobility Training;Fluidtherapy;Cryotherapy;Ultrasound;DME and/or AE instruction;Manual Therapy;Passive range of motion;Cognitive  remediation/compensation    Plan Update hand exercises for P/ROM if cleared by MD, stand balance and functional transfers to clear toileting at night    OT Home Exercise Plan Has HEP from CIR for shoulder and thumb (L)    Consulted and Agree with Plan  of Care Patient;Family member/caregiver    Family Member Consulted WIFE           Patient will benefit from skilled therapeutic intervention in order to improve the following deficits and impairments:   Body Structure / Function / Physical Skills: ADL, Coordination, Endurance, GMC, UE functional use, Sensation, Decreased knowledge of precautions, Balance, Body mechanics, Decreased knowledge of use of DME, Flexibility, IADL, Pain, Strength, FMC, Dexterity, Cardiopulmonary status limiting activity, ROM, Mobility, Tone Cognitive Skills: Attention, Thought, Problem Solve, Safety Awareness, Memory, Energy/Drive     Visit Diagnosis: Stiffness of left wrist, not elsewhere classified  Stiffness of left hand, not elsewhere classified  Stiffness of left shoulder, not elsewhere classified    Problem List Patient Active Problem List   Diagnosis Date Noted  . Fracture of multiple ribs with pain 08/10/2019  . Clavicle fracture 08/10/2019  . Closed displaced fracture of phalanx of left thumb, sequela 08/10/2019  . TBI (traumatic brain injury) ([place]) 07/23/2019  . Traumatic closed fracture of distal clavicle with minimal displacement, left, initial encounter 07/19/2019  . Traumatic brain injury with loss of consciousness ([place])   . Multiple trauma   . Benign prostatic hyperplasia   . Essential hypertension   . AKI (acute kidney injury) ([place])   . Stage 3b chronic kidney disease   . Prediabetes   . Acute blood loss anemia   . Thrombocytopenia ([place])   . Fall 07/14/2019  . Type 2 diabetes mellitus ([place])   . Eosinophilia   . Need for prophylactic vaccination and inoculation against influenza 11/12/2013  . Nocturnal leg cramps 11/23/2012  . Pain  in joint, shoulder region 11/23/2012  . Fall 11/04/2012  . Hematoma 11/04/2012  . Acute upper respiratory infections of unspecified site 11/04/2012  . HYPERLIPIDEMIA TYPE IIB / III 03/08/2008  . Orthostatic hypotension 03/08/2008  . CAD, NATIVE VESSEL 03/08/2008    Carey Bullocks, OTR/L 08/24/2019, 1:48 PM  [facility] [address] [place], [place], [address] Phone: [phone]   Fax:  [phone]  Name: [name] MRN: [id number] Date of Birth: 1933-04-04

## 2019-08-24 NOTE — Patient Instructions (Addendum)
Access Code: [email or web address] URL: [email or web address] Date: 08/24/2019 Prepared by: Willow Ora  Exercises Side Stepping with Counter Support - 1 x daily - 5 x weekly - 1 sets - 3 reps Walking March - 1 x daily - 5 x weekly - 1 sets - 3 reps Tandem Walking with Counter Support - 1 x daily - 5 x weekly - 1 sets - 3 reps Heel Toe Raises with Counter Support - 1 x daily - 5 x weekly - 1 sets - 10 reps Step Up - 1 x daily - 5 x weekly - 1 sets - 10 reps

## 2019-08-25 ENCOUNTER — Ambulatory Visit (INDEPENDENT_AMBULATORY_CARE_PROVIDER_SITE_OTHER): Payer: PPO | Admitting: Family Medicine

## 2019-08-25 ENCOUNTER — Ambulatory Visit
Admission: RE | Admit: 2019-08-25 | Discharge: 2019-08-25 | Disposition: A | Discharge status: Home / Self Care | Payer: PPO | Source: Ambulatory Visit | Attending: Family Medicine | Admitting: Family Medicine

## 2019-08-25 ENCOUNTER — Encounter: Payer: Self-pay | Admitting: Family Medicine

## 2019-08-25 VITALS — BP 132/60 | HR 100 | Temp 97.9°F | Resp 16 | Ht 67.0 in | Wt 188.0 lb

## 2019-08-25 DIAGNOSIS — M47814 Spondylosis without myelopathy or radiculopathy, thoracic region: Secondary | ICD-10-CM | POA: Diagnosis not present

## 2019-08-25 DIAGNOSIS — J90 Pleural effusion, not elsewhere classified: Secondary | ICD-10-CM | POA: Diagnosis not present

## 2019-08-25 DIAGNOSIS — T07XXXA Unspecified multiple injuries, initial encounter: Secondary | ICD-10-CM

## 2019-08-25 DIAGNOSIS — S42032A Displaced fracture of lateral end of left clavicle, initial encounter for closed fracture: Secondary | ICD-10-CM

## 2019-08-25 DIAGNOSIS — S069X1D Unspecified intracranial injury with loss of consciousness of 30 minutes or less, subsequent encounter: Secondary | ICD-10-CM | POA: Diagnosis not present

## 2019-08-25 DIAGNOSIS — N1832 Chronic kidney disease, stage 3b: Secondary | ICD-10-CM

## 2019-08-25 IMAGING — CR DG CHEST 2V
2 series · 2 of 2 positions shown · non-contrast
Comparison: [DATE]

CLINICAL DATA: Follow-up left pleural effusion

EXAM:
CHEST - 2 VIEW

[w chest pa]
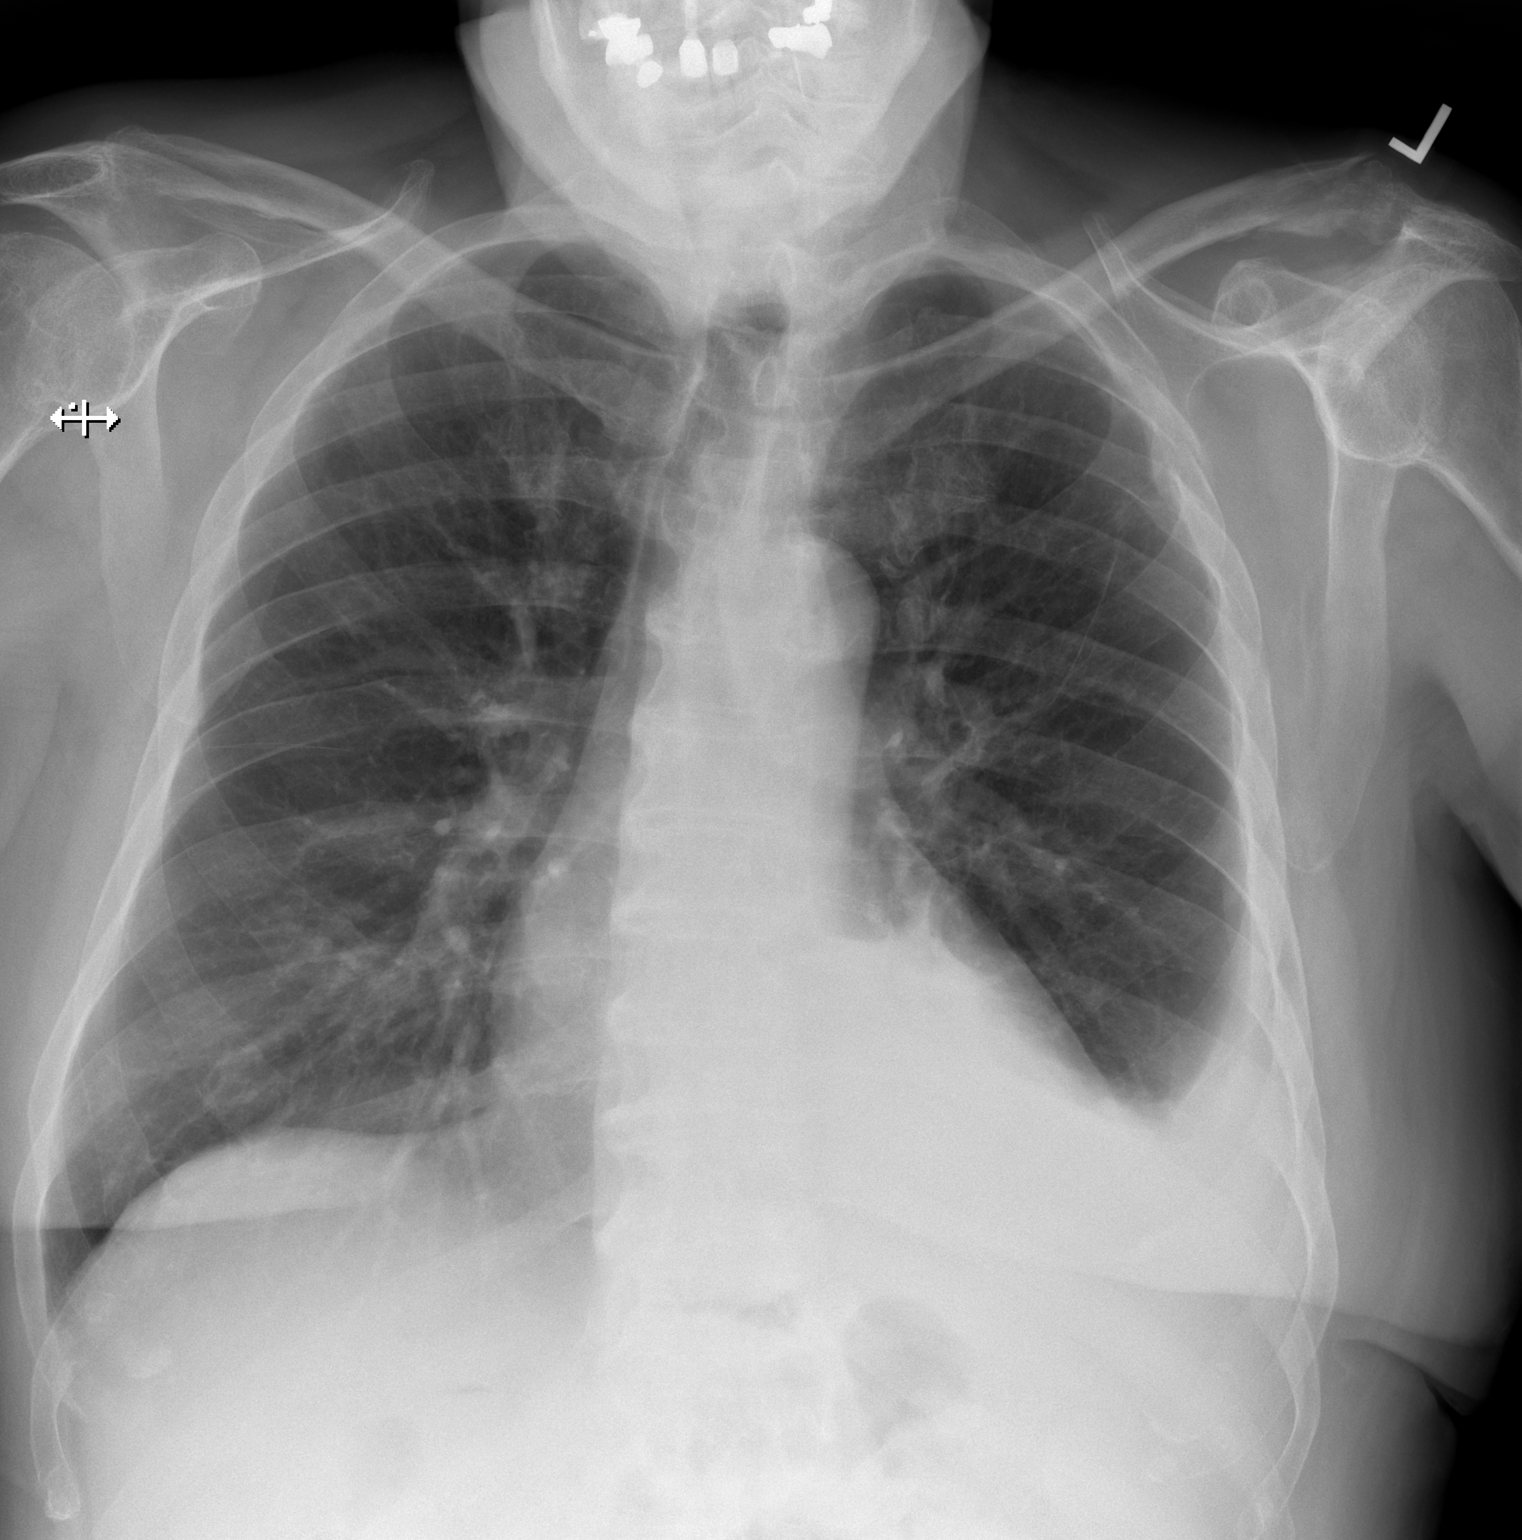

[w chest lat]
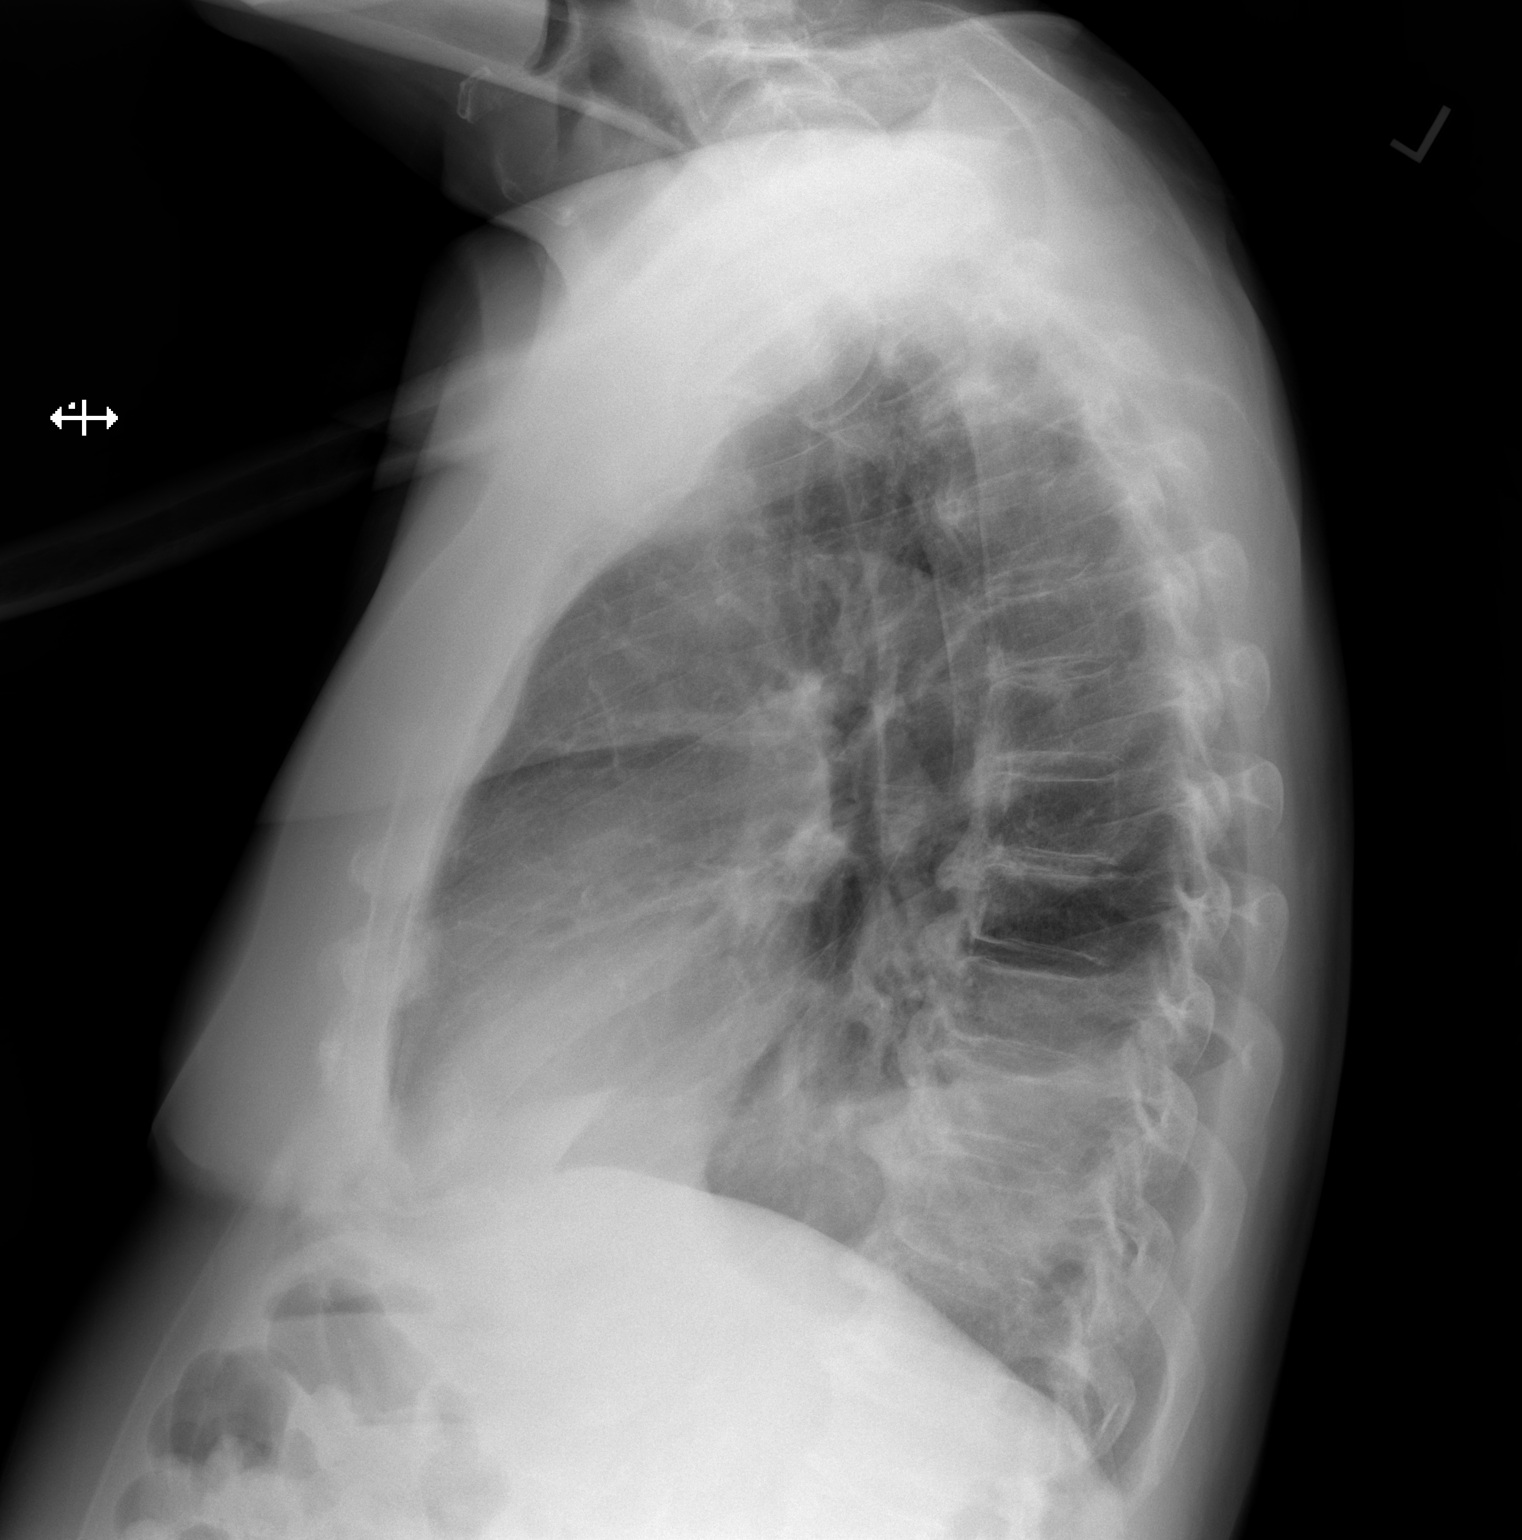

[2 of 2 positions shown; findings below may reference images not displayed]

FINDINGS: Cardiac shadow is stable. Left-sided pleural effusion is again noted
and stable. No new focal infiltrate is noted. Degenerative changes
of the thoracic spine are noted.
IMPRESSION: Stable left pleural effusion.

## 2019-08-25 NOTE — Therapy (Signed)
[facility] [address] [place], [place], [address] Phone: [phone]   Fax:  [phone]  Physical Therapy Treatment  Patient Details  Name: [name] MRN: [id number] Date of Birth: [birth year] Referring Provider (PT): Reesa Chew PA-C   Encounter Date: 08/24/2019   PT End of Session - 08/24/19 1026    Visit Number 2    Number of Visits 17    Date for PT Re-Evaluation 11/12/19   written for 60 day POC   Authorization Type [employer] P[employer] - $15 co pay, collect 1 co pay per day for multiple disciplines    PT Start Time 1018    PT Stop Time 1100    PT Time Calculation (min) 42 min    Equipment Utilized During Treatment Gait belt    Activity Tolerance Patient tolerated treatment well    Behavior During Therapy [facility] for tasks assessed/performed;Impulsive           Past Medical History:  Diagnosis Date  . Allergy   . BPH (benign prostatic hyperplasia)   . BPH (benign prostatic hypertrophy)   . CAD (coronary artery disease)    s/p cypher DES to pLAD 6/08; normal LVF;  ETT-Myoview 2009: no ischemia   . Coronary artery disease   . Eosinophilia   . High cholesterol   . HTN (hypertension)   . Hyperlipidemia   . Hypertension   . MI (myocardial infarction) ([place])   . Myocardial infarction ([place])   . Prediabetes   . Trigeminal neuralgia     Past Surgical History:  Procedure Laterality Date  . APPENDECTOMY    . CARDIAC CATHETERIZATION  07/30/2006   CORONARY ANGIOPLASTY WITH STENT PLACEMENT  . CARDIAC CATHETERIZATION    . EXPLORATORY LAPAROTOMY     age 69  . EXPLORATORY LAPAROTOMY      There were no vitals filed for this visit.   Subjective Assessment - 08/24/19 1021    Subjective Has been to the ED a few times since eval. !st visit was due to acute HA and low back pain. MD found HA to be atraumatic and a repeat CT compared to his CT from his injury weeks ago showed chronic subdural hemotoma.  Pt was then started on Prednisone for the back apin. His most recent ED visit was due to increased confustion, urinary retention and memory loss inssues . He was placed on antibiotics for possible infection. The MD also discontinued  the steriods and Amitiplline due to no indication for them and possible they were contributing to his confusion/aggitation. He was found to be dehydrated as well, which was improved with IV fluids.  Spouse and pt report his confusion has cleared up since stopping those meds. No falls, did have a stumble yesterday per spouse, however the pt states he does not feel like he stumbled when walking out the door over rocks He does report that he did have the walking stick with him. Has been using the walking stick more outside than the cane.    Pertinent History PMH: BPH, CAD, HTN, CKD III, prediabetes, mild cognitive impairment.    Patient Stated Goals wants to walk without worrying about potentially having a dizzy spell - reports 4-5 spells a day that lasts only a couple of seconds.    Currently in Pain? No/denies    Pain Score 0-No pain              [facility] PT Assessment - 08/24/19 1029  Standardized Balance Assessment   Standardized Balance Assessment Dynamic Gait Index      Dynamic Gait Index   Level Surface Mild Impairment   shuffled steps, no LOB   Change in Gait Speed Normal    Gait with Horizontal Head Turns Mild Impairment    Gait with Vertical Head Turns Mild Impairment    Gait and Pivot Turn Moderate Impairment    Step Over Obstacle Severe Impairment   min assist due to toes catching   Step Around Obstacles Severe Impairment   knocked into both cones   Steps Moderate Impairment    Total Score 11    DGI comment: <19 predictive of falls                 [facility] Adult PT Treatment/Exercise - 08/24/19 1029      Transfers   Transfers Sit to Stand;Stand to Sit    Sit to Stand 5: Supervision;Without upper extremity assist;From bed;4: Min guard     Stand to Sit 5: Supervision;4: Min guard;Without upper extremity assist;To bed      Ambulation/Gait   Ambulation/Gait Yes    Ambulation/Gait Assistance 5: Supervision;4: Min guard    Ambulation/Gait Assistance Details use of cane to enter/exit session. use of no device in session with min guard assist for safety due to pt being impulsive at times with DGI and HEP. Toward end of session trialed with pt a small base quad cane and straight cane with rubber quad tip for 115 feet each. Pt needed cues on proper sequencing and cane placement with all canes. He tends to use both quad canes as straight canes, however states he does not feel stable with the straight cane. The pt will need further gait training with all devices to determine the best, safest option.   Assistive device Large base quad cane;None; small base quad cane, straight cane with rubber quad tip.   Gait Pattern Step-through pattern;Decreased stride length;Decreased arm swing - left    Ambulation Surface Level;Indoor      Neuro Re-ed    Neuro Re-ed Details  reviewed with pt/spouse ex's he is doing from CIR at home. sit<>stands too easy and were removed. Refer to [place] program for full details on ex's issued today. cues needed on form/technique. min guard assist for balance/safety. Pt's spouse educated along side pt and shown how to guard pt at home.           issued the following to pt's HEP today: Access Code: [email or web address] URL: [email or web address] Date: 08/24/2019 Prepared by: Willow Ora  Exercises Side Stepping with Counter Support - 1 x daily - 5 x weekly - 1 sets - 3 reps Walking March - 1 x daily - 5 x weekly - 1 sets - 3 reps Tandem Walking with Counter Support - 1 x daily - 5 x weekly - 1 sets - 3 reps Heel Toe Raises with Counter Support - 1 x daily - 5 x weekly - 1 sets - 10 reps Step Up - 1 x daily - 5 x weekly - 1 sets - 10 reps        PT Education - 08/24/19 1524    Education Details results of  DGI, [employer] HEP    Person(s) Educated Patient;Spouse    Methods Explanation;Demonstration;Verbal cues;Handout;Tactile cues    Comprehension Verbalized understanding;Returned demonstration;Verbal cues required;Tactile cues required;Need further instruction            PT Short Term Goals - 08/17/19 5885  PT SHORT TERM GOAL #1   Title Pt will be independent with initial HEP in order to build upon functional gains made in therapy. 09/14/19    Time 4    Period Weeks    Status New    Target Date 09/14/19      PT SHORT TERM GOAL #2   Title Pt will undergo further assessment of DGI to determine fall risk - STG and LTG to be written as appropriate.    Time 4    Period Weeks    Status New      PT SHORT TERM GOAL #3   Title Pt and pt's spouse will verbalize understanding of fall prevention in the home.    Time 4    Period Weeks    Status New      PT SHORT TERM GOAL #4   Title Stairs to be assessed as appropriate - LTG to be written as appropriate.    Time 4    Period Weeks    Status New      PT SHORT TERM GOAL #5   Title Pt will perform 5x sit <> stand with no BLE bracing against mat table/chair and improved eccentric control in order  to demo improved safety with transfers.    Time 4    Period Weeks    Status New             PT Long Term Goals - 08/17/19 0909      PT LONG TERM GOAL #1   Title Pt will be independent with final HEP in order to build upon functional gains made in therapy. ALL LTGS DUE 10/12/19    Time 8    Period Weeks    Status New    Target Date 10/12/19      PT LONG TERM GOAL #2   Title Stair goal to be written as appropriate for pt to safely enter/exit home and go up to 2nd floor.    Time 8    Period Weeks    Status New      PT LONG TERM GOAL #3   Title DGI goal to be written as appropriate in order to demo decr fall risk.    Time 8    Period Weeks    Status New      PT LONG TERM GOAL #4   Title Pt will perform TUG with no AD vs. LRAD in  13.5 seconds or less in order to demo decr fall risk.    Time 8    Period Weeks    Status New      PT LONG TERM GOAL #5   Title Pt will ambulate at least 500' with supervision over level indoor and unlevel outdoor surfaces in order to demo improved community mobility.    Time 8    Period Weeks    Status New             08/24/19 1026  Plan  Clinical Impression Statement Today's skilled session initially focused on setting baseline score for Dynamic Gait Index with pt scoring 19/24. Remainder of session focused on establishment of an updated HEP and gait training with various canes. The pt needs max cues for sequencing with all canes. Pt and spouse report he used a walking stick before. He is to bring this to his next session to assess it's use with gait. The pt is progressing toward goals and should benefit from continued PT to progress toward unmet goals.  Personal Factors and Comorbidities Comorbidity 3+;Past/Current Experience;Behavior Pattern  Comorbidities BPH, CAD, HTN, CKD III, prediabetes, mild cognitive impairment,  Examination-Activity Limitations Transfers;Stairs;Locomotion Level  Examination-Participation Restrictions Community Activity  Pt will benefit from skilled therapeutic intervention in order to improve on the following deficits Abnormal gait;Decreased balance;Decreased activity tolerance;Decreased coordination;Decreased knowledge of use of DME;Decreased safety awareness;Decreased strength;Difficulty walking;Dizziness;Impaired UE functional use  Stability/Clinical Decision Making Evolving/Moderate complexity  Rehab Potential Good  PT Frequency 2x / week  PT Duration 8 weeks  PT Treatment/Interventions ADLs/Self Care Home Management;DME Instruction;Stair training;Therapeutic activities;Functional mobility training;Gait training;Therapeutic exercise;Balance training;Neuromuscular re-education;Patient/family education;Vestibular  PT Next Visit Plan continue gait training  with LRAD (LBQC, SBQC, single cane vs no device), balance and strengthening ex's.  PT Home Exercise Plan Access Code: [email or web address]  Consulted and Agree with Plan of Care Patient;Family member/caregiver  Family Member Consulted pt's spouse        Patient will benefit from skilled therapeutic intervention in order to improve the following deficits and impairments:  Abnormal gait, Decreased balance, Decreased activity tolerance, Decreased coordination, Decreased knowledge of use of DME, Decreased safety awareness, Decreased strength, Difficulty walking, Dizziness, Impaired UE functional use  Visit Diagnosis: Unsteadiness on feet  Other abnormalities of gait and mobility  Muscle weakness (generalized)  Difficulty in walking, not elsewhere classified  Other symptoms and signs involving the nervous system     Problem List Patient Active Problem List   Diagnosis Date Noted  . Fracture of multiple ribs with pain 08/10/2019  . Clavicle fracture 08/10/2019  . Closed displaced fracture of phalanx of left thumb, sequela 08/10/2019  . TBI (traumatic brain injury) ([place]) 07/23/2019  . Traumatic closed fracture of distal clavicle with minimal displacement, left, initial encounter 07/19/2019  . Traumatic brain injury with loss of consciousness ([place])   . Multiple trauma   . Benign prostatic hyperplasia   . Essential hypertension   . AKI (acute kidney injury) ([place])   . Stage 3b chronic kidney disease   . Prediabetes   . Acute blood loss anemia   . Thrombocytopenia ([place])   . Fall 07/14/2019  . Type 2 diabetes mellitus ([place])   . Eosinophilia   . Need for prophylactic vaccination and inoculation against influenza 11/12/2013  . Nocturnal leg cramps 11/23/2012  . Pain in joint, shoulder region 11/23/2012  . Fall 11/04/2012  . Hematoma 11/04/2012  . Acute upper respiratory infections of unspecified site 11/04/2012  . HYPERLIPIDEMIA TYPE IIB / III 03/08/2008  . Orthostatic hypotension  03/08/2008  . CAD, NATIVE VESSEL 03/08/2008    Willow Ora, PTA, [facility] [address] [place], [place] [address] [phone] 08/25/19, 3:25 PM   Name: [name] MRN: [id number] Date of Birth: Nov 30, 1933

## 2019-08-25 NOTE — Progress Notes (Signed)
Subjective:    Patient ID: [name], male    DOB: [birth year], 86 y.o.   MRN: [id number]  HPI Patient is a very pleasant 86 year old Caucasian male who presents today for hospital discharge follow-up.  Patient was walking up a set of steps at his home on June 8.  He has no recollection of the injury however his wife was home and heard him stumble.  He fell down 12 steps striking his head against the wall and the steps as he fell.  He was extremely confused and lost consciousness on the scene.  Wife witnessed convulsions.  Was taken directly to the emergency room.  Patient was found at that time to have a small subarachnoid hemorrhage.  He was also found to have 7 rib fractures as well as a left clavicular fracture and a broken thumb.  Patient went closed reduction and splinting of the broken thumb.  He was treated with nonweightbearing sling in his left upper extremity for the clavicular fracture.  This has since been mobilized to allow some range of motion and the patient is not wearing a sling today.  The rib fractures were allowed to heal spontaneously.  Patient was discharged from the hospital July 2 after being admitted to a rehab.  Patient presented back to the hospital with confusion and fever on July 13.  White blood cell count was found to be 15.  Patient was found to have a left-sided pleural effusion and consolidation.  He was started on doxycycline for presumed pneumonia.  Since that time he is afebrile.  He has not had his white blood cell count rechecked.  He has not had a repeat chest x-ray.  His wife states that his mental status has improved.  He is close to his baseline although at times he is still disoriented.  His balance has improved although he is still a high fall risk.  He stood today to allow me to examine him and he staggered and had to hold onto his walker in the table to maintain balance.  Patient had a repeat head CT on July 13 as well as July 15 which showed a  left-sided subdural hematoma.  Initial measurements were 8 mm on the 13th and 6 mm on the 15th.  Patient denies any headache.  He denies any confusion.  His wife states that his confusion has improved dramatically after he stopped prednisone and amitriptyline.  Per his report he was given amitriptyline to help him sleep.  He was given prednisone for "arthritis in his back".  The patient states that he was having significant pain in his lower back with aching and throbbing into his gluteus muscles bilaterally.  He had an x-ray of the lumbar spine during his hospitalization that showed mild degenerative changes but no acute fracture.  He denies any saddle anesthesia.  He denies any bowel or bladder incontinence.  He denies any leg weakness or leg numbness.  During his initial hospitalization he did have urinary retention and required a catheter and bethanechol.  However he was able to wean off this and is currently only on his doxazosin.  He is still holding his aspirin Past Medical History:  Diagnosis Date  . Allergy   . BPH (benign prostatic hyperplasia)   . BPH (benign prostatic hypertrophy)   . CAD (coronary artery disease)    s/p cypher DES to pLAD 6/08; normal LVF;  ETT-Myoview 2009: no ischemia   . Coronary artery disease   . Eosinophilia   .  High cholesterol   . HTN (hypertension)   . Hyperlipidemia   . Hypertension   . MI (myocardial infarction) ([place])   . Myocardial infarction ([place])   . Prediabetes   . Trigeminal neuralgia    Past Surgical History:  Procedure Laterality Date  . APPENDECTOMY    . CARDIAC CATHETERIZATION  07/30/2006   CORONARY ANGIOPLASTY WITH STENT PLACEMENT  . CARDIAC CATHETERIZATION    . EXPLORATORY LAPAROTOMY     age 72  . EXPLORATORY LAPAROTOMY     Current Outpatient Medications on File Prior to Visit  Medication Sig Dispense Refill  . acetaminophen (TYLENOL) 500 MG tablet Take 1,000 mg by mouth every 6 (six) hours as needed for moderate pain.    Marland Kitchen albuterol  (PROAIR HFA) 108 (90 Base) MCG/ACT inhaler INHALE 2 PUFFS INTO THE LUNGS EVERY 6 HOURS AS NEEDED FOR WHEEZING OR SHORTNESS OF BREATH 6.7 g 0  . atorvastatin (LIPITOR) 40 MG tablet Take 40 mg by mouth daily.    . Calcium-Vitamin D 600-200 MG-UNIT tablet Take 1 tablet by mouth 2 (two) times daily. (Patient taking differently: Take 1 tablet by mouth daily. ) 60 tablet 0  . doxazosin (CARDURA) 2 MG tablet Take 1 tablet (2 mg total) by mouth daily. 30 tablet 0  . doxycycline (VIBRAMYCIN) 100 MG capsule Take 1 capsule (100 mg total) by mouth 2 (two) times daily. One po bid x 7 days 14 capsule 0  . escitalopram (LEXAPRO) 10 MG tablet TAKE 1 TABLET BY MOUTH EVERY DAY 90 tablet 2  . ezetimibe (ZETIA) 10 MG tablet Take 1 tablet (10 mg total) by mouth daily. Please make yearly appt with Dr. [name] for October for future refills. 1st attempt 90 tablet 0  . fluticasone furoate-vilanterol (BREO ELLIPTA) 200-25 MCG/INH AEPB Inhale 1 puff into the lungs daily. 1 each 1  . lidocaine (LIDODERM) 5 % Place 1 patch onto the skin daily. Remove & Discard patch within 12 hours or as directed by MD (Patient taking differently: Place 1 patch onto the skin daily as needed. Remove & Discard patch within 12 hours or as directed by MD) 30 patch 0  . melatonin 3 MG TABS tablet Take 1 tablet (3 mg total) by mouth at bedtime. 30 tablet 0  . methocarbamol (ROBAXIN) 500 MG tablet Take 1 tablet (500 mg total) by mouth every 6 (six) hours as needed for muscle spasms. 60 tablet 0  . Multiple Vitamin (MULTIVITAMIN WITH MINERALS) TABS tablet Take 1 tablet by mouth daily.    . polyethylene glycol (MIRALAX / GLYCOLAX) 17 g packet Take 17 g by mouth daily. 14 each 0  . traMADol (ULTRAM) 50 MG tablet Take 1 tablet (50 mg total) by mouth every 6 (six) hours as needed. 60 tablet 0  . amitriptyline (ELAVIL) 10 MG tablet Take 10 mg by mouth at bedtime.    . [DISCONTINUED] fluticasone (FLONASE) 50 MCG/ACT nasal spray USE 2 SPRAYS INTO BOTH NOSTRILS  DAILY. (Patient not taking: Reported on 08/20/2019) 48 g 3  . [DISCONTINUED] WIXELA INHUB 250-50 MCG/DOSE AEPB INHALE 1 PUFF BY MOUTH TWICE A DAY (Patient not taking: Reported on 08/20/2019) 180 each 1   No current facility-administered medications on file prior to visit.   Allergies  Allergen Reactions  . Gabapentin     Visual changes   Social History   Socioeconomic History  . Marital status: Married    Spouse name: [name]  . Number of children: 1  . Years of education: Not on  file  . Highest education level: Not on file  Occupational History  . Occupation: retired professor    Comment: History   . Occupation: missionary  Tobacco Use  . Smoking status: Never Smoker  . Smokeless tobacco: Never Used  . Tobacco comment: quit over 64yrs ago.  Substance and Sexual Activity  . Alcohol use: Never  . Drug use: Never  . Sexual activity: Yes    Partners: Female  Other Topics Concern  . Not on file  Social History Narrative   ** Merged History Encounter **       Lives with his wife (second marriage in 2015, first marriage ended when his wife died alzheimer's disease). Since his remarriage, his adult daughter doesn't speak with him.   Social Determinants of Health   Financial Resource Strain:   . Difficulty of Paying Living Expenses:   Food Insecurity:   . Worried About Charity fundraiser in the Last Year:   . Arboriculturist in the Last Year:   Transportation Needs:   . Film/video editor (Medical):   Marland Kitchen Lack of Transportation (Non-Medical):   Physical Activity:   . Days of Exercise per Week:   . Minutes of Exercise per Session:   Stress:   . Feeling of Stress :   Social Connections:   . Frequency of Communication with Friends and Family:   . Frequency of Social Gatherings with Friends and Family:   . Attends Religious Services:   . Active Member of Clubs or Organizations:   . Attends Archivist Meetings:   Marland Kitchen Marital Status:   Intimate Partner Violence:    . Fear of Current or Ex-Partner:   . Emotionally Abused:   Marland Kitchen Physically Abused:   . Sexually Abused:    Family History  Problem Relation Age of Onset  . Stomach cancer Mother   . Heart disease Father   . Heart disease Brother   . Aortic aneurysm Brother   . Colon cancer Brother   . Coronary artery disease Father 25  . Coronary artery disease Brother   . Aortic aneurysm Brother   . Colon cancer Brother   . Esophageal cancer Neg Hx   . Rectal cancer Neg Hx   . Arthritis Daughter        rheumatoid     Review of Systems  All other systems reviewed and are negative.      Objective:   Physical Exam Vitals reviewed.  Constitutional:      General: He is not in acute distress.    Appearance: He is well-developed. He is not diaphoretic.  HENT:     Head: Normocephalic and atraumatic.     Right Ear: External ear normal.     Left Ear: External ear normal.     Nose: Nose normal.     Mouth/Throat:     Pharynx: No oropharyngeal exudate.  Eyes:     General: No scleral icterus.       Right eye: No discharge.        Left eye: No discharge.     Conjunctiva/sclera: Conjunctivae normal.     Pupils: Pupils are equal, round, and reactive to light.  Neck:     Thyroid: No thyromegaly.     Vascular: No JVD.     Trachea: No tracheal deviation.  Cardiovascular:     Rate and Rhythm: Normal rate and regular rhythm.     Heart sounds: Normal heart sounds. No murmur heard.  No friction rub. No gallop.   Pulmonary:     Effort: Pulmonary effort is normal. No accessory muscle usage, prolonged expiration or respiratory distress.     Breath sounds: Decreased air movement present. No stridor. Examination of the left-middle field reveals decreased breath sounds. Examination of the left-lower field reveals decreased breath sounds. Decreased breath sounds present. No wheezing or rales.    Chest:     Chest wall: Tenderness present. There is dullness to percussion.  Abdominal:     General: Bowel  sounds are normal. There is no distension.     Palpations: Abdomen is soft. There is no mass.     Tenderness: There is no abdominal tenderness. There is no guarding or rebound.  Musculoskeletal:        General: No tenderness. Normal range of motion.     Cervical back: Normal range of motion and neck supple.  Lymphadenopathy:     Cervical: No cervical adenopathy.  Skin:    General: Skin is warm.     Coloration: Skin is not pale.     Findings: No erythema or rash.  Neurological:     Mental Status: He is alert and oriented to person, place, and time.     Cranial Nerves: No cranial nerve deficit.     Motor: No abnormal muscle tone.     Coordination: Coordination abnormal.     Gait: Gait abnormal.     Deep Tendon Reflexes: Reflexes are normal and symmetric.  Psychiatric:        Behavior: Behavior normal.        Thought Content: Thought content normal.        Judgment: Judgment normal.           Assessment & Plan:  Pleural effusion on left - Plan: DG Chest 2 View, CBC with Differential/Platelet, BASIC METABOLIC PANEL WITH GFR  Traumatic brain injury, with loss of consciousness of 30 minutes or less, subsequent encounter  Traumatic closed fracture of distal clavicle with minimal displacement, left, initial encounter  Multiple trauma  Stage 3b chronic kidney disease  My biggest concern is his recent fever, elevated white blood cell count, and left-sided pleural effusion.  He has been on doxycycline since the 15th.  He continues to report pain in his left chest that is getting worse.  However he believes this is due to the rib fracture.  He denies any cough or hemoptysis or fever.  He does report pleurisy.  Therefore I would like to repeat the chest x-ray to ensure that the pleural effusion is not increasing and that there is not evidence of an empyema or worsening infection.  If the chest x-ray is stable, I plan to see the patient back next week for reevaluation.  Also repeat a CBC  to monitor his leukocytosis.  Regarding his traumatic brain injury and subdural hematoma, patient is clinically stable having recently had a CAT scan.  I would repeat CT scan if there is clinical deterioration, worsening confusion, or ataxia.  Otherwise recheck the patient clinically next week.  Monitor his chronic kidney disease with a BMP.  Rib fracture should heal spontaneously.  Thumb fracture and clavicular fracture being managed by orthopedics.  Continue to hold aspirin until balance has improved and subdural hematoma is resolving.  Hold beta-blocker at the present time as blood pressure is just now starting to increase.  Recheck blood pressure next week.

## 2019-08-26 LAB — BASIC METABOLIC PANEL WITH GFR
BUN/Creatinine Ratio: 15 (calc) (ref 6–22)
BUN: 23 mg/dL (ref 7–25)
CO2: 27 mmol/L (ref 20–32)
Calcium: 9.1 mg/dL (ref 8.6–10.3)
Chloride: 101 mmol/L (ref 98–110)
Creat: 1.51 mg/dL — ABNORMAL HIGH (ref 0.70–1.11)
GFR, Est African American: 48 mL/min/{1.73_m2} — ABNORMAL LOW (ref >=60)
GFR, Est Non African American: 42 mL/min/{1.73_m2} — ABNORMAL LOW (ref >=60)
Glucose, Bld: 94 mg/dL (ref 65–99)
Potassium: 4.8 mmol/L (ref 3.5–5.3)
Sodium: 137 mmol/L (ref 135–146)

## 2019-08-26 LAB — CBC WITH DIFFERENTIAL/PLATELET
Absolute Monocytes: 647 cells/uL (ref 200–950)
Basophils Absolute: 23 cells/uL (ref 0–200)
Basophils Relative: 0.3 %
Eosinophils Absolute: 601 cells/uL — ABNORMAL HIGH (ref 15–500)
Eosinophils Relative: 7.7 %
HCT: 33.4 % — ABNORMAL LOW (ref 38.5–50.0)
Hemoglobin: 11.1 g/dL — ABNORMAL LOW (ref 13.2–17.1)
Lymphs Abs: 952 cells/uL (ref 850–3900)
MCH: 32.2 pg (ref 27.0–33.0)
MCHC: 33.2 g/dL (ref 32.0–36.0)
MCV: 96.8 fL (ref 80.0–100.0)
MPV: 9 fL (ref 7.5–12.5)
Monocytes Relative: 8.3 %
Neutro Abs: 5577 cells/uL (ref 1500–7800)
Neutrophils Relative %: 71.5 %
Platelets: 378 10*3/uL (ref 140–400)
RBC: 3.45 10*6/uL — ABNORMAL LOW (ref 4.20–5.80)
RDW: 13.3 % (ref 11.0–15.0)
Total Lymphocyte: 12.2 %
WBC: 7.8 10*3/uL (ref 3.8–10.8)

## 2019-08-28 ENCOUNTER — Ambulatory Visit: Payer: PPO

## 2019-08-28 ENCOUNTER — Other Ambulatory Visit: Payer: Self-pay

## 2019-08-28 DIAGNOSIS — R2689 Other abnormalities of gait and mobility: Secondary | ICD-10-CM

## 2019-08-28 DIAGNOSIS — R41841 Cognitive communication deficit: Secondary | ICD-10-CM

## 2019-08-28 DIAGNOSIS — M6281 Muscle weakness (generalized): Secondary | ICD-10-CM

## 2019-08-28 DIAGNOSIS — R2681 Unsteadiness on feet: Secondary | ICD-10-CM | POA: Diagnosis not present

## 2019-08-28 DIAGNOSIS — R262 Difficulty in walking, not elsewhere classified: Secondary | ICD-10-CM

## 2019-08-28 NOTE — Therapy (Signed)
[facility] [address] [place], [place], [address] Phone: [phone]   Fax:  [phone]  Speech Language Pathology Treatment  Patient Details  Name: [name] MRN: [id number] Date of Birth: 1933-09-03 Referring Provider (SLP): Reesa Chew, PA-C   Encounter Date: 08/28/2019   End of Session - 08/28/19 1321    Visit Number 2    Number of Visits 17    Date for SLP Re-Evaluation 11/16/19    SLP Start Time 0935    SLP Stop Time  4854    SLP Time Calculation (min) 40 min    Activity Tolerance Patient tolerated treatment well           Past Medical History:  Diagnosis Date  . Allergy   . BPH (benign prostatic hyperplasia)   . BPH (benign prostatic hypertrophy)   . CAD (coronary artery disease)    s/p cypher DES to pLAD 6/08; normal LVF;  ETT-Myoview 2009: no ischemia   . Coronary artery disease   . Eosinophilia   . High cholesterol   . HTN (hypertension)   . Hyperlipidemia   . Hypertension   . MI (myocardial infarction) ([place])   . Myocardial infarction ([place])   . Prediabetes   . Trigeminal neuralgia     Past Surgical History:  Procedure Laterality Date  . APPENDECTOMY    . CARDIAC CATHETERIZATION  07/30/2006   CORONARY ANGIOPLASTY WITH STENT PLACEMENT  . CARDIAC CATHETERIZATION    . EXPLORATORY LAPAROTOMY     age 10  . EXPLORATORY LAPAROTOMY      There were no vitals filed for this visit.   Subjective Assessment - 08/28/19 1000    Subjective Pt enters with wife today - req'd cues to go down hall to [facility] room instead of to gym.    Currently in Pain? No/denies                 ADULT SLP TREATMENT - 08/28/19 1000      General Information   Behavior/Cognition Alert;Cooperative;Pleasant mood      Treatment Provided   Treatment provided Cognitive-Linquistic      Cognitive-Linquistic Treatment   Treatment focused on Cognition    Skilled Treatment SLP reminded pt/wife about his  memory testing last session and that a notebook for recall may be helpful if he is having difficulty recalling details about conversations or events. SLP then assisted pt and wife develop a plan for pt to more independently manage his evening meds. Pt to begin tonight. SLP initiated Cognitie Linguistic Quick Test.       Assessment / Recommendations / Plan   Plan Continue with current plan of care      Progression Toward Goals   Progression toward goals Progressing toward goals            SLP Education - 08/28/19 1319    Education Details notebook/binder for memory compensation, compensations for medication administration    Person(s) Educated Patient;Spouse    Methods Explanation    Comprehension Verbalized understanding;Need further instruction            SLP Short Term Goals - 08/28/19 1323      SLP SHORT TERM GOAL #1   Title pt will demo knowledge of his memory copmensation system by attempt to use with rare min cues in 2 sessions    Time 4    Period Weeks    Status On-going      SLP SHORT TERM GOAL #2  Title pt will demo correct/successful orientation to a memory compensation system in 3 sessions    Time 4    Period Weeks    Status On-going      SLP SHORT TERM GOAL #3   Title pt will tell SLP 2 cognitive deficits independently, and how they may impact him at home with rare min A in 3 sessions    Time 4    Period Weeks    Status On-going      SLP SHORT TERM GOAL #4   Title pt will undergo further cognitive linguisitic testing PRN    Time 3    Period Weeks   or 7 total sessions   Status On-going            SLP Long Term Goals - 08/28/19 1323      SLP LONG TERM GOAL #1   Title pt will demo correct usage of a memory compensation system for appointment and/or medication management, to-do lists, daily schedules in 3 sessions    Time 8    Period Weeks   or 17 total sessions, for all LTGs   Status On-going      SLP LONG TERM GOAL #2   Title pt will demo  anticipatory awareness regarding deficit areas, independently, in 3 sessions    Time 8    Period Weeks    Status On-going            Plan - 08/28/19 1322    Clinical Impression Statement Pt presents today wtih memory deficits - SLP discussed some compensations for memory for med administration and, generally, a notebook/binder for recall of details of conversations or events. A full cognitive evaluation was initiated today. Wife states pt requires more cues at home re: schedule and safety precautions, and pt looked to wife for details on history and details of other therapies (tasks, goals, deficits).    Speech Therapy Frequency 2x / week    Duration --   8 weeks or 17 total sessions   Treatment/Interventions Language facilitation;Cueing hierarchy;Cognitive reorganization;Internal/external aids;Patient/family education;Compensatory strategies;SLP instruction and feedback;Functional tasks;Environmental controls    Potential to Achieve Goals Good    Consulted and Agree with Plan of Care Patient           Patient will benefit from skilled therapeutic intervention in order to improve the following deficits and impairments:   Cognitive communication deficit    Problem List Patient Active Problem List   Diagnosis Date Noted  . Fracture of multiple ribs with pain 08/10/2019  . Clavicle fracture 08/10/2019  . Closed displaced fracture of phalanx of left thumb, sequela 08/10/2019  . TBI (traumatic brain injury) ([place]) 07/23/2019  . Traumatic closed fracture of distal clavicle with minimal displacement, left, initial encounter 07/19/2019  . Traumatic brain injury with loss of consciousness ([place])   . Multiple trauma   . Benign prostatic hyperplasia   . Essential hypertension   . AKI (acute kidney injury) ([place])   . Stage 3b chronic kidney disease   . Prediabetes   . Acute blood loss anemia   . Thrombocytopenia ([place])   . Fall 07/14/2019  . Type 2 diabetes mellitus ([place])   .  Eosinophilia   . Need for prophylactic vaccination and inoculation against influenza 11/12/2013  . Nocturnal leg cramps 11/23/2012  . Pain in joint, shoulder region 11/23/2012  . Fall 11/04/2012  . Hematoma 11/04/2012  . Acute upper respiratory infections of unspecified site 11/04/2012  . HYPERLIPIDEMIA TYPE IIB / III  03/08/2008  . Orthostatic hypotension 03/08/2008  . CAD, NATIVE VESSEL 03/08/2008    [facility] ,[place], [place]  08/28/2019, 1:24 PM  [facility] [address] [place], [place], [address] Phone: [phone]   Fax:  [phone]   Name: [name] MRN: [id number] Date of Birth: 11/22/1933

## 2019-08-28 NOTE — Therapy (Signed)
[facility] [address] [place], [place], [address] Phone: [phone]   Fax:  [phone]  Physical Therapy Treatment  Patient Details  Name: [name] MRN: [id number] Date of Birth: May 27, 1933 Referring Provider (PT): Reesa Chew PA-C   Encounter Date: 08/28/2019   PT End of Session - 08/28/19 1024    Visit Number 3    Number of Visits 17    Date for PT Re-Evaluation 11/12/19   written for 60 day POC   Authorization Type [employer] P[employer] - $15 co pay, collect 1 co pay per day for multiple disciplines    PT Start Time 1017    PT Stop Time 1059    PT Time Calculation (min) 42 min    Equipment Utilized During Treatment Gait belt    Activity Tolerance Patient tolerated treatment well    Behavior During Therapy [facility] for tasks assessed/performed;Impulsive           Past Medical History:  Diagnosis Date  . Allergy   . BPH (benign prostatic hyperplasia)   . BPH (benign prostatic hypertrophy)   . CAD (coronary artery disease)    s/p cypher DES to pLAD 6/08; normal LVF;  ETT-Myoview 2009: no ischemia   . Coronary artery disease   . Eosinophilia   . High cholesterol   . HTN (hypertension)   . Hyperlipidemia   . Hypertension   . MI (myocardial infarction) ([place])   . Myocardial infarction ([place])   . Prediabetes   . Trigeminal neuralgia     Past Surgical History:  Procedure Laterality Date  . APPENDECTOMY    . CARDIAC CATHETERIZATION  07/30/2006   CORONARY ANGIOPLASTY WITH STENT PLACEMENT  . CARDIAC CATHETERIZATION    . EXPLORATORY LAPAROTOMY     age 21  . EXPLORATORY LAPAROTOMY      There were no vitals filed for this visit.   Subjective Assessment - 08/28/19 1021    Subjective Patient reports that he saw PCP and the appointment went well. Goes back to see him again next week. Patient is walking with walking stick today. No falls/stumbles since last visit, did have one misstep.     Pertinent History PMH: BPH, CAD, HTN, CKD III, prediabetes, mild cognitive impairment.    Patient Stated Goals wants to walk without worrying about potentially having a dizzy spell - reports 4-5 spells a day that lasts only a couple of seconds.    Currently in Pain? No/denies                             [facility] Adult PT Treatment/Exercise - 08/28/19 0001      Transfers   Transfers Sit to Stand;Stand to Sit    Sit to Stand 5: Supervision    Stand to Sit 5: Supervision      Ambulation/Gait   Ambulation/Gait Yes    Ambulation/Gait Assistance 5: Supervision;4: Min guard    Ambulation/Gait Assistance Details Completed gait training with walking stick today, patient demo improved sequencing and gait pattern with walking stick. PT did providing verbal cueing for proper positioning of walking stick with step to ensure it is not placed to far anterior/lateral for improved stability.     Ambulation Distance (Feet) 345 Feet    Assistive device --   walking stick   Gait Pattern Step-through pattern;Decreased stride length;Decreased arm swing - left    Ambulation Surface Level;Indoor      High  Level Balance   High Level Balance Activities Tandem walking;Head turns    High Level Balance Comments Completed tandem walking in // bars with light UE support and progressed to no UE support. Completed horizontal head turns with scaning environment, 1 x 100 ft with walking stick, intermittent CGA as needed.       Neuro Re-ed    Neuro Re-ed Details  Standing on airex pad completing alternating toe taps to cones, 2 x 10 reps with progression of UE support. Initially completed with BUE support and progressed to completing without UE. Patient requiring intermittent CGA for steadying when completing without UE support. PT providing verbal cues to return foot to starting position with completion of each exercise.                Balance Exercises - 08/28/19 0001      Balance Exercises:  Standing   Standing Eyes Opened Narrow base of support (BOS);Head turns;Foam/compliant surface;Limitations    Standing Eyes Opened Limitations completed horizontal/vertical head turns 1 x 10 reps to each direction with feet slightly apart. Progressed to completing with feet together 1 x 10 reps each, with increased difficulty with narrow BOS noted.     Standing Eyes Closed Narrow base of support (BOS);Wide (BOA);Foam/compliant surface;Limitations    Standing Eyes Closed Limitations focused on holding 15-20 seconds as tolerated by patient, increased sway noted with EC.              PT Education - 08/28/19 1145    Education Details Educated on walking program (starting with 5 minutes with walking stick)    Person(s) Educated Patient;Spouse    Methods Explanation    Comprehension Verbalized understanding            PT Short Term Goals - 08/17/19 0906      PT SHORT TERM GOAL #1   Title Pt will be independent with initial HEP in order to build upon functional gains made in therapy. 09/14/19    Time 4    Period Weeks    Status New    Target Date 09/14/19      PT SHORT TERM GOAL #2   Title Pt will undergo further assessment of DGI to determine fall risk - STG and LTG to be written as appropriate.    Time 4    Period Weeks    Status New      PT SHORT TERM GOAL #3   Title Pt and pt's spouse will verbalize understanding of fall prevention in the home.    Time 4    Period Weeks    Status New      PT SHORT TERM GOAL #4   Title Stairs to be assessed as appropriate - LTG to be written as appropriate.    Time 4    Period Weeks    Status New      PT SHORT TERM GOAL #5   Title Pt will perform 5x sit <> stand with no BLE bracing against mat table/chair and improved eccentric control in order  to demo improved safety with transfers.    Time 4    Period Weeks    Status New             PT Long Term Goals - 08/17/19 0909      PT LONG TERM GOAL #1   Title Pt will be independent  with final HEP in order to build upon functional gains made in therapy. ALL LTGS DUE 10/12/19  Time 8    Period Weeks    Status New    Target Date 10/12/19      PT LONG TERM GOAL #2   Title Stair goal to be written as appropriate for pt to safely enter/exit home and go up to 2nd floor.    Time 8    Period Weeks    Status New      PT LONG TERM GOAL #3   Title DGI goal to be written as appropriate in order to demo decr fall risk.    Time 8    Period Weeks    Status New      PT LONG TERM GOAL #4   Title Pt will perform TUG with no AD vs. LRAD in 13.5 seconds or less in order to demo decr fall risk.    Time 8    Period Weeks    Status New      PT LONG TERM GOAL #5   Title Pt will ambulate at least 500' with supervision over level indoor and unlevel outdoor surfaces in order to demo improved community mobility.    Time 8    Period Weeks    Status New                 Plan - 08/28/19 1143    Clinical Impression Statement Completed gait training with patient's personal walking stick in today's session, with patient demonstrating improved gait pattern and sequencing. Continued balance actvities, incorpoarting complaint surface and vision removed with patient tolerating well. Patient will continue to benefit from skilled PT services to progress toward all goals.    Personal Factors and Comorbidities Comorbidity 3+;Past/Current Experience;Behavior Pattern    Comorbidities BPH, CAD, HTN, CKD III, prediabetes, mild cognitive impairment,    Examination-Activity Limitations Transfers;Stairs;Locomotion Level    Examination-Participation Restrictions Community Activity    Stability/Clinical Decision Making Evolving/Moderate complexity    Rehab Potential Good    PT Frequency 2x / week    PT Duration 8 weeks    PT Treatment/Interventions ADLs/Self Care Home Management;DME Instruction;Stair training;Therapeutic activities;Functional mobility training;Gait training;Therapeutic  exercise;Balance training;Neuromuscular re-education;Patient/family education;Vestibular    PT Next Visit Plan continue gait training with LRAD (LBQC, SBQC, single cane vs no device), balance and strengthening ex's.    PT Home Exercise Plan Access Code: [email or web address]    Consulted and Agree with Plan of Care Patient;Family member/caregiver    Family Member Consulted pt's spouse           Patient will benefit from skilled therapeutic intervention in order to improve the following deficits and impairments:  Abnormal gait, Decreased balance, Decreased activity tolerance, Decreased coordination, Decreased knowledge of use of DME, Decreased safety awareness, Decreased strength, Difficulty walking, Dizziness, Impaired UE functional use  Visit Diagnosis: Muscle weakness (generalized)  Difficulty in walking, not elsewhere classified  Unsteadiness on feet  Other abnormalities of gait and mobility     Problem List Patient Active Problem List   Diagnosis Date Noted  . Fracture of multiple ribs with pain 08/10/2019  . Clavicle fracture 08/10/2019  . Closed displaced fracture of phalanx of left thumb, sequela 08/10/2019  . TBI (traumatic brain injury) ([place]) 07/23/2019  . Traumatic closed fracture of distal clavicle with minimal displacement, left, initial encounter 07/19/2019  . Traumatic brain injury with loss of consciousness ([place])   . Multiple trauma   . Benign prostatic hyperplasia   . Essential hypertension   . AKI (acute kidney injury) ([place])   . Stage 3b chronic  kidney disease   . Prediabetes   . Acute blood loss anemia   . Thrombocytopenia ([place])   . Fall 07/14/2019  . Type 2 diabetes mellitus ([place])   . Eosinophilia   . Need for prophylactic vaccination and inoculation against influenza 11/12/2013  . Nocturnal leg cramps 11/23/2012  . Pain in joint, shoulder region 11/23/2012  . Fall 11/04/2012  . Hematoma 11/04/2012  . Acute upper respiratory infections of unspecified site  11/04/2012  . HYPERLIPIDEMIA TYPE IIB / III 03/08/2008  . Orthostatic hypotension 03/08/2008  . CAD, NATIVE VESSEL 03/08/2008    Jones Bales, PT, DPT 08/28/2019, 12:42 PM  [facility] [address] [place], [place], [address] Phone: [phone]   Fax:  [phone]  Name: [name] MRN: [id number] Date of Birth: [birth year]

## 2019-08-31 ENCOUNTER — Other Ambulatory Visit: Payer: Self-pay | Admitting: Neurological Surgery

## 2019-08-31 DIAGNOSIS — S63105D Unspecified dislocation of left thumb, subsequent encounter: Secondary | ICD-10-CM | POA: Diagnosis not present

## 2019-08-31 DIAGNOSIS — S42032 Displaced fracture of lateral end of left clavicle: Secondary | ICD-10-CM | POA: Diagnosis not present

## 2019-08-31 DIAGNOSIS — I10 Essential (primary) hypertension: Secondary | ICD-10-CM | POA: Diagnosis not present

## 2019-08-31 DIAGNOSIS — S42032D Displaced fracture of lateral end of left clavicle, subsequent encounter for fracture with routine healing: Secondary | ICD-10-CM | POA: Diagnosis not present

## 2019-08-31 DIAGNOSIS — S63105 Unspecified dislocation of left thumb: Secondary | ICD-10-CM | POA: Diagnosis not present

## 2019-08-31 DIAGNOSIS — Z6829 Body mass index (BMI) 29.0-29.9, adult: Secondary | ICD-10-CM | POA: Diagnosis not present

## 2019-08-31 DIAGNOSIS — I6203 Nontraumatic chronic subdural hemorrhage: Secondary | ICD-10-CM

## 2019-09-03 ENCOUNTER — Ambulatory Visit: Payer: PPO | Admitting: Physical Therapy

## 2019-09-03 ENCOUNTER — Ambulatory Visit: Payer: PPO | Admitting: Speech Pathology

## 2019-09-03 ENCOUNTER — Other Ambulatory Visit: Payer: Self-pay

## 2019-09-03 ENCOUNTER — Encounter: Payer: Self-pay | Admitting: Physical Therapy

## 2019-09-03 ENCOUNTER — Ambulatory Visit: Payer: PPO | Admitting: Occupational Therapy

## 2019-09-03 DIAGNOSIS — M6281 Muscle weakness (generalized): Secondary | ICD-10-CM

## 2019-09-03 DIAGNOSIS — M25632 Stiffness of left wrist, not elsewhere classified: Secondary | ICD-10-CM

## 2019-09-03 DIAGNOSIS — R2689 Other abnormalities of gait and mobility: Secondary | ICD-10-CM

## 2019-09-03 DIAGNOSIS — R2681 Unsteadiness on feet: Secondary | ICD-10-CM

## 2019-09-03 DIAGNOSIS — R262 Difficulty in walking, not elsewhere classified: Secondary | ICD-10-CM

## 2019-09-03 DIAGNOSIS — M25512 Pain in left shoulder: Secondary | ICD-10-CM

## 2019-09-03 DIAGNOSIS — R41841 Cognitive communication deficit: Secondary | ICD-10-CM

## 2019-09-03 DIAGNOSIS — R29818 Other symptoms and signs involving the nervous system: Secondary | ICD-10-CM

## 2019-09-03 DIAGNOSIS — M79642 Pain in left hand: Secondary | ICD-10-CM

## 2019-09-03 DIAGNOSIS — M25642 Stiffness of left hand, not elsewhere classified: Secondary | ICD-10-CM

## 2019-09-03 DIAGNOSIS — M25612 Stiffness of left shoulder, not elsewhere classified: Secondary | ICD-10-CM

## 2019-09-03 NOTE — Therapy (Signed)
[facility] [address] [place], [place], [address] Phone: [phone]   Fax:  [phone]  Occupational Therapy Treatment  Patient Details  Name: [name] MRN: [id number] Date of Birth: 1933/12/30 Referring Provider (OT): Reesa Chew   Encounter Date: 09/03/2019   OT End of Session - 09/03/19 1526    Visit Number 3    Number of Visits 17    Date for OT Re-Evaluation 10/27/19    Authorization Type [employer] - Follow Medicare guidelines    Progress Note Due on Visit 10    OT Start Time 1322    OT Stop Time 1402    OT Time Calculation (min) 40 min    Activity Tolerance Patient tolerated treatment well    Behavior During Therapy [facility] for tasks assessed/performed;Impulsive           Past Medical History:  Diagnosis Date  . Allergy   . BPH (benign prostatic hyperplasia)   . BPH (benign prostatic hypertrophy)   . CAD (coronary artery disease)    s/p cypher DES to pLAD 6/08; normal LVF;  ETT-Myoview 2009: no ischemia   . Coronary artery disease   . Eosinophilia   . High cholesterol   . HTN (hypertension)   . Hyperlipidemia   . Hypertension   . MI (myocardial infarction) ([employer] [place])   . Myocardial infarction ([place])   . Prediabetes   . Trigeminal neuralgia     Past Surgical History:  Procedure Laterality Date  . APPENDECTOMY    . CARDIAC CATHETERIZATION  07/30/2006   CORONARY ANGIOPLASTY WITH STENT PLACEMENT  . CARDIAC CATHETERIZATION    . EXPLORATORY LAPAROTOMY     age 47  . EXPLORATORY LAPAROTOMY      There were no vitals filed for this visit.   Subjective Assessment - 09/03/19 1521    Subjective  Patient's wife brought back letter from ortho appt - indocating PROM can be initiated in left thumb, and patient can wean from brace    Patient is accompanied by: Family member    Limitations NWB LUE, Thumb splint    Currently in Pain? No/denies    Pain Score 0-No pain                         OT Treatments/Exercises (OP) - 09/03/19 0001      ADLs   Cooking Discussed the various skills involved in cooking - cognitive, physical.  Patient to cook a simple familiar dish (Greek rice) with his wife to encourage attention and sequencing, as well as functional use of non dominant left hand.        Hand Exercises   Other Hand Exercises Began gentle passive motion to help with opposition, flex, abd.  Patient now able to oppose 5th digit tip with cueing, although difficulty sustaining that position    Other Hand Exercises Began simple coordination exercises for left hand - grasp, release, in hand manipulation.  See patient instructions                  OT Education - 09/03/19 1526    Education Details coordination - also reviewed OT goals    Person(s) Educated Patient;Spouse    Methods Explanation;Demonstration;Tactile cues;Verbal cues;Handout    Comprehension Verbalized understanding;Returned demonstration;Need further instruction            OT Short Term Goals - 09/03/19 1343      OT SHORT TERM GOAL #1  Title Patient will complete an HEP designed to improve left thumb range of motion with min cueing due 09/27/19    Status Achieved      OT SHORT TERM GOAL #3   Title Patient will don/doff a pull over shirt with set up assistance    Status Achieved             OT Long Term Goals - 08/13/19 1401      OT LONG TERM GOAL #1   Title Patient will complete HEP/Home activities program to improve functional mobility and use of left hand due 9/21    Time 8    Period Weeks    Status New    Target Date 10/27/19      OT LONG TERM GOAL #2   Title Patient will bathe and dress himself with modified independence - with increased time    Time 8    Period Weeks    Status New      OT LONG TERM GOAL #3   Title Patient will prepare a familiar hot meal using left hand as non dominant with supervision    Time 8    Period Weeks    Status New       OT LONG TERM GOAL #4   Title Patient will demonstrate ability to manipulate small items within left hand as evidenced by his ability to complete 9 hole peg test in less than 1 minute    Time 8    Period Weeks    Status New      OT LONG TERM GOAL #5   Title Patient will understand recommendations related to driving    Time 8    Period Weeks    Status New                 Plan - 09/03/19 1527    Clinical Impression Statement Patient now clear for PROM left thumb, progressing steadilly    OT Frequency 2x / week    OT Duration 8 weeks    OT Treatment/Interventions Self-care/ADL training;Electrical Stimulation;Iontophoresis;Therapeutic exercise;Moist Heat;Paraffin;Neuromuscular education;Splinting;Patient/family education;Balance training;Therapeutic activities;Functional Mobility Training;Fluidtherapy;Cryotherapy;Ultrasound;DME and/or AE instruction;Manual Therapy;Passive range of motion;Cognitive remediation/compensation    Plan Update hand exercises for P/ROM, stand balance and functional transfers to clear toileting at night    Consulted and Agree with Plan of Care Patient;Family member/caregiver           Patient will benefit from skilled therapeutic intervention in order to improve the following deficits and impairments:           Visit Diagnosis: Muscle weakness (generalized)  Unsteadiness on feet  Stiffness of left wrist, not elsewhere classified  Stiffness of left hand, not elsewhere classified  Stiffness of left shoulder, not elsewhere classified  Other symptoms and signs involving the nervous system  Pain in left hand  Acute pain of left shoulder    Problem List Patient Active Problem List   Diagnosis Date Noted  . Fracture of multiple ribs with pain 08/10/2019  . Clavicle fracture 08/10/2019  . Closed displaced fracture of phalanx of left thumb, sequela 08/10/2019  . TBI (traumatic brain injury) ([place]) 07/23/2019  . Traumatic closed fracture of  distal clavicle with minimal displacement, left, initial encounter 07/19/2019  . Traumatic brain injury with loss of consciousness ([place])   . Multiple trauma   . Benign prostatic hyperplasia   . Essential hypertension   . AKI (acute kidney injury) ([place])   . Stage 3b chronic kidney disease   .  Prediabetes   . Acute blood loss anemia   . Thrombocytopenia ([place])   . Fall 07/14/2019  . Type 2 diabetes mellitus ([place])   . Eosinophilia   . Need for prophylactic vaccination and inoculation against influenza 11/12/2013  . Nocturnal leg cramps 11/23/2012  . Pain in joint, shoulder region 11/23/2012  . Fall 11/04/2012  . Hematoma 11/04/2012  . Acute upper respiratory infections of unspecified site 11/04/2012  . HYPERLIPIDEMIA TYPE IIB / III 03/08/2008  . Orthostatic hypotension 03/08/2008  . CAD, NATIVE VESSEL 03/08/2008    Mariah Milling, OTR/L 09/03/2019, 3:28 PM  [facility] [address] [place], [place], [address] Phone: [phone]   Fax:  [phone]  Name: ARHUM PEEPLES MRN: [id number] Date of Birth: 1933-11-23

## 2019-09-03 NOTE — Patient Instructions (Signed)
  Coordination Activities  Perform the following activities for 10  minutes 2  times per day with left hand(s).   Flip cards 1 at a time as fast as you can.  Deal cards with your thumb (Hold deck in hand and push card off top with thumb).  Rotate card in hand (clockwise and counter-clockwise).  Pick up coins, buttons, marbles, dried beans/pasta of different sizes and place in container.  Pick up coins and stack.

## 2019-09-04 ENCOUNTER — Ambulatory Visit (INDEPENDENT_AMBULATORY_CARE_PROVIDER_SITE_OTHER): Payer: PPO | Admitting: Family Medicine

## 2019-09-04 VITALS — BP 130/64 | HR 93 | Temp 96.2°F | Ht 67.0 in | Wt 188.0 lb

## 2019-09-04 DIAGNOSIS — R61 Generalized hyperhidrosis: Secondary | ICD-10-CM

## 2019-09-04 DIAGNOSIS — N1832 Chronic kidney disease, stage 3b: Secondary | ICD-10-CM

## 2019-09-04 DIAGNOSIS — J90 Pleural effusion, not elsewhere classified: Secondary | ICD-10-CM

## 2019-09-04 DIAGNOSIS — S42032A Displaced fracture of lateral end of left clavicle, initial encounter for closed fracture: Secondary | ICD-10-CM | POA: Diagnosis not present

## 2019-09-04 DIAGNOSIS — S069X1D Unspecified intracranial injury with loss of consciousness of 30 minutes or less, subsequent encounter: Secondary | ICD-10-CM | POA: Diagnosis not present

## 2019-09-04 LAB — CBC WITH DIFFERENTIAL/PLATELET
Absolute Monocytes: 517 cells/uL (ref 200–950)
Basophils Absolute: 27 cells/uL (ref 0–200)
Basophils Relative: 0.4 %
Eosinophils Absolute: 435 cells/uL (ref 15–500)
Eosinophils Relative: 6.4 %
HCT: 34.2 % — ABNORMAL LOW (ref 38.5–50.0)
Hemoglobin: 11.1 g/dL — ABNORMAL LOW (ref 13.2–17.1)
Lymphs Abs: 782 cells/uL — ABNORMAL LOW (ref 850–3900)
MCH: 31.9 pg (ref 27.0–33.0)
MCHC: 32.5 g/dL (ref 32.0–36.0)
MCV: 98.3 fL (ref 80.0–100.0)
MPV: 8.7 fL (ref 7.5–12.5)
Monocytes Relative: 7.6 %
Neutro Abs: 5039 cells/uL (ref 1500–7800)
Neutrophils Relative %: 74.1 %
Platelets: 297 10*3/uL (ref 140–400)
RBC: 3.48 10*6/uL — ABNORMAL LOW (ref 4.20–5.80)
RDW: 13.1 % (ref 11.0–15.0)
Total Lymphocyte: 11.5 %
WBC: 6.8 10*3/uL (ref 3.8–10.8)

## 2019-09-04 NOTE — Therapy (Addendum)
[facility] [address] [place], [place], [address] Phone: [phone]   Fax:  [phone]  Physical Therapy Treatment  Patient Details  Name: [name] MRN: [id number] Date of Birth: Dec 15, 1933 Referring Provider (PT): Reesa Chew PA-C   Encounter Date: 09/03/2019   PT End of Session - 09/03/19 1536    Visit Number 4    Number of Visits 17    Date for PT Re-Evaluation 11/12/19   written for 60 day POC   Authorization Type [employer] P[employer] - $15 co pay, collect 1 co pay per day for multiple disciplines    PT Start Time 1533    PT Stop Time 1615    PT Time Calculation (min) 42 min    Equipment Utilized During Treatment Gait belt    Activity Tolerance Patient tolerated treatment well    Behavior During Therapy [facility] for tasks assessed/performed;Impulsive           Past Medical History:  Diagnosis Date  . Allergy   . BPH (benign prostatic hyperplasia)   . BPH (benign prostatic hypertrophy)   . CAD (coronary artery disease)    s/p cypher DES to pLAD 6/08; normal LVF;  ETT-Myoview 2009: no ischemia   . Coronary artery disease   . Eosinophilia   . High cholesterol   . HTN (hypertension)   . Hyperlipidemia   . Hypertension   . MI (myocardial infarction) ([place])   . Myocardial infarction ([place])   . Prediabetes   . Trigeminal neuralgia     Past Surgical History:  Procedure Laterality Date  . APPENDECTOMY    . CARDIAC CATHETERIZATION  07/30/2006   CORONARY ANGIOPLASTY WITH STENT PLACEMENT  . CARDIAC CATHETERIZATION    . EXPLORATORY LAPAROTOMY     age 73  . EXPLORATORY LAPAROTOMY      There were no vitals filed for this visit.   Subjective Assessment - 09/03/19 1534    Subjective Having some rib/chest pain on the left side when he breaths in. Does have known fluid in the lung. See's his primary MD tomorrow for a follow up. No new falls.    Patient is accompained by: Family member     Pertinent History PMH: BPH, CAD, HTN, CKD III, prediabetes, mild cognitive impairment.    Patient Stated Goals wants to walk without worrying about potentially having a dizzy spell - reports 4-5 spells a day that lasts only a couple of seconds.    Currently in Pain? Yes    Pain Score 8     Pain Location Rib cage    Pain Orientation Left;Lower    Pain Descriptors / Indicators Aching    Pain Type Acute pain    Pain Onset More than a month ago    Pain Frequency Constant    Aggravating Factors  breathing in    Pain Relieving Factors nothing at this tme    Pain Score 4    Pain Location Shoulder    Pain Orientation Left    Pain Descriptors / Indicators Aching    Pain Type Acute pain;Chronic pain    Pain Frequency Intermittent                [facility] Adult PT Treatment/Exercise - 09/03/19 1538      Transfers   Transfers Sit to Stand;Stand to Sit    Sit to Stand 5: Supervision    Stand to Sit 5: Supervision      Ambulation/Gait   Ambulation/Gait  Yes    Ambulation/Gait Assistance 5: Supervision;4: Min guard    Ambulation/Gait Assistance Details around gym with sesssion with no balance loss noted this session.     Assistive device Other (Comment)   walking stick   Gait Pattern Step-through pattern;Decreased stride length;Decreased arm swing - left    Ambulation Surface Level;Indoor    Stairs Yes    Stairs Assistance 4: Min guard    Stair Management Technique One rail Right;Step to pattern;Forwards;Sideways    Number of Stairs 4   X4 reps   Height of Stairs 6               Balance Exercises - 09/03/19 1548      Balance Exercises: Standing   Standing Eyes Closed Narrow base of support (BOS);Wide (BOA);Head turns;Foam/compliant surface;Other reps (comment);30 secs;Limitations    Standing Eyes Closed Limitations on airex in corner with a chair in front for safety: feet together for EC no head movements, progressing to feet apart (about hip width apart) for EC head movements  left<>right, up<>down and diagnols both ways for ~10 reps each. min guard to min assist with touch to walls/chair at times for balance. cues on shoulder/hip position (posture) to assist with balance, pt tends to lean posterior with cues to shift forward needed.     Balance Beam standing across red foam beam with HHA on right/intermittent touch to chair back on left- alternating fwd stepping to floor/backn onto beam, then alternating bwd stepping to floor/back onto beam for ~10 reps each, min assist for balance with cues for increased step length/height with stepping.     Partial Tandem Stance Foam/compliant surface;3 reps;15 secs;Limitations    Partial Tandem Stance Limitations on airex in corner with a chair in front for safety: performed x 3 reps each foot forward with up to mod assist for balance. Increased difficulty with right foot forward vs left foot forward.     Sit to Stand Standard surface;Upper extremity support;Foam/compliant surface;Limitations    Sit to Stand Limitations seated with feet across red foam beam: sit<>stands for 2 sets of 10 reps with light UE support. cues for tall standing and controlled descent. min guard to min assist for balance.              PT Education - 09/03/19 1630    Education Details stair negotiation with one person vs 2. using 2 person assist if pt is having an off balance day/increased fatigue.    Person(s) Educated Patient;Spouse    Methods Explanation;Demonstration;Verbal cues    Comprehension Verbalized understanding;Returned demonstration;Need further instruction             PT Short Term Goals - 09/04/19 1136      PT SHORT TERM GOAL #1   Title Pt will be independent with initial HEP in order to build upon functional gains made in therapy. 09/14/19    Time 4    Period Weeks    Status On-going    Target Date 09/14/19      PT SHORT TERM GOAL #2   Title Pt will improve DGI score to at least a 14/24 in order to demo decr fall risk.    Baseline  08/24/19: baseline score of 11/24 today    Status Revised      PT SHORT TERM GOAL #3   Title Pt and pt's spouse will verbalize understanding of fall prevention in the home.    Time 4    Period Weeks    Status On-going  PT SHORT TERM GOAL #4   Title Stairs to be assessed as appropriate - LTG to be written as appropriate.    Baseline 09/03/19: min guard assist with single rail sideways for 4 stairs x 4 reps in session today, step to pattern.    Status Achieved      PT SHORT TERM GOAL #5   Title Pt will perform 5x sit <> stand with no BLE bracing against mat table/chair and improved eccentric control in order  to demo improved safety with transfers.    Time 4    Period Weeks    Status On-going            PT Long Term Goals - 09/07/19 0912      PT LONG TERM GOAL #1   Title Pt will be independent with final HEP in order to build upon functional gains made in therapy. ALL LTGS DUE 10/12/19    Time 8    Period Weeks    Status New      PT LONG TERM GOAL #2   Title Pt will perform at least 12 steps with step to pattern vs. step through pattern with single handrail with supervision in order to safely enter/exit home and go to 2nd floor.    Time 8    Period Weeks    Status New      PT LONG TERM GOAL #3   Title Pt will improve DGI score to at least a 17/24 in order to demo decr fall risk.    Baseline 11/24    Time 8    Period Weeks    Status Revised      PT LONG TERM GOAL #4   Title Pt will perform TUG with no AD vs. LRAD in 13.5 seconds or less in order to demo decr fall risk.    Time 8    Period Weeks    Status New      PT LONG TERM GOAL #5   Title Pt will ambulate at least 500' with supervision over level indoor and unlevel outdoor surfaces in order to demo improved community mobility.    Time 8    Period Weeks    Status New              Plan - 09/03/19 1536    Clinical Impression Statement Today's skilled session focused initially on stair negotation. Pt and  spouse report he has 2 people with him every time he goes up or down stairs (spouse and a daughter). In session today pt needed min guard assist of one person for 4 reps of 4 stairs. Discussed it appears he is safe at this time to have one person with him at this time. Pt and spouse agreed. Remainder of session focused on balance reactions with rest breaks taken as needed. No other issues reported or noted in session. The pt is progressing toward goals and should benefit from continued PT to progress toward unmet goals. Primary PT updated goals based on DGI score and stairs.    Personal Factors and Comorbidities Comorbidity 3+;Past/Current Experience;Behavior Pattern    Comorbidities BPH, CAD, HTN, CKD III, prediabetes, mild cognitive impairment,    Examination-Activity Limitations Transfers;Stairs;Locomotion Level    Examination-Participation Restrictions Community Activity    Stability/Clinical Decision Making Evolving/Moderate complexity    Rehab Potential Good    PT Frequency 2x / week    PT Duration 8 weeks    PT Treatment/Interventions ADLs/Self Care Home Management;DME Instruction;Stair training;Therapeutic activities;Functional  mobility training;Gait training;Therapeutic exercise;Balance training;Neuromuscular re-education;Patient/family education;Vestibular    PT Next Visit Plan continue gait training with LRAD/walking stick, balance and strengthening ex's.    PT Home Exercise Plan Access Code: [email or web address]    Consulted and Agree with Plan of Care Patient;Family member/caregiver    Family Member Consulted pt's spouse           Patient will benefit from skilled therapeutic intervention in order to improve the following deficits and impairments:  Abnormal gait, Decreased balance, Decreased activity tolerance, Decreased coordination, Decreased knowledge of use of DME, Decreased safety awareness, Decreased strength, Difficulty walking, Dizziness, Impaired UE functional use  Visit  Diagnosis: Muscle weakness (generalized)  Difficulty in walking, not elsewhere classified  Unsteadiness on feet  Other abnormalities of gait and mobility     Problem List Patient Active Problem List   Diagnosis Date Noted  . Fracture of multiple ribs with pain 08/10/2019  . Clavicle fracture 08/10/2019  . Closed displaced fracture of phalanx of left thumb, sequela 08/10/2019  . TBI (traumatic brain injury) ([place]) 07/23/2019  . Traumatic closed fracture of distal clavicle with minimal displacement, left, initial encounter 07/19/2019  . Traumatic brain injury with loss of consciousness ([place])   . Multiple trauma   . Benign prostatic hyperplasia   . Essential hypertension   . AKI (acute kidney injury) ([place])   . Stage 3b chronic kidney disease   . Prediabetes   . Acute blood loss anemia   . Thrombocytopenia ([place])   . Fall 07/14/2019  . Type 2 diabetes mellitus ([place])   . Eosinophilia   . Need for prophylactic vaccination and inoculation against influenza 11/12/2013  . Nocturnal leg cramps 11/23/2012  . Pain in joint, shoulder region 11/23/2012  . Fall 11/04/2012  . Hematoma 11/04/2012  . Acute upper respiratory infections of unspecified site 11/04/2012  . HYPERLIPIDEMIA TYPE IIB / III 03/08/2008  . Orthostatic hypotension 03/08/2008  . CAD, NATIVE VESSEL 03/08/2008    Willow Ora, PTA, [facility] [address], [place], [place] [address] [phone] 09/04/19, 11:47 AM   Name: [name] MRN: [id number] Date of Birth: 30-Mar-1933   Janann August, PT, DPT 09/07/19 9:14 AM

## 2019-09-04 NOTE — Progress Notes (Signed)
Subjective:    Patient ID: [name], male    DOB: 28-Sep-1933, 86 y.o.   MRN: [id number]  HPI  08/29/19 Patient is a very pleasant 86 year old Caucasian male who presents today for hospital discharge follow-up.  Patient was walking up a set of steps at his home on June 8.  He has no recollection of the injury however his wife was home and heard him stumble.  He fell down 12 steps striking his head against the wall and the steps as he fell.  He was extremely confused and lost consciousness on the scene.  Wife witnessed convulsions.  Was taken directly to the emergency room.  Patient was found at that time to have a small subarachnoid hemorrhage.  He was also found to have 7 rib fractures as well as a left clavicular fracture and a broken thumb.  Patient went closed reduction and splinting of the broken thumb.  He was treated with nonweightbearing sling in his left upper extremity for the clavicular fracture.  This has since been mobilized to allow some range of motion and the patient is not wearing a sling today.  The rib fractures were allowed to heal spontaneously.  Patient was discharged from the hospital July 2 after being admitted to a rehab.  Patient presented back to the hospital with confusion and fever on July 13.  White blood cell count was found to be 15.  Patient was found to have a left-sided pleural effusion and consolidation.  He was started on doxycycline for presumed pneumonia.  Since that time he is afebrile.  He has not had his white blood cell count rechecked.  He has not had a repeat chest x-ray.  His wife states that his mental status has improved.  He is close to his baseline although at times he is still disoriented.  His balance has improved although he is still a high fall risk.  He stood today to allow me to examine him and he staggered and had to hold onto his walker in the table to maintain balance.  Patient had a repeat head CT on July 13 as well as July 15 which showed a  left-sided subdural hematoma.  Initial measurements were 8 mm on the 13th and 6 mm on the 15th.  Patient denies any headache.  He denies any confusion.  His wife states that his confusion has improved dramatically after he stopped prednisone and amitriptyline.  Per his report he was given amitriptyline to help him sleep.  He was given prednisone for "arthritis in his back".  The patient states that he was having significant pain in his lower back with aching and throbbing into his gluteus muscles bilaterally.  He had an x-ray of the lumbar spine during his hospitalization that showed mild degenerative changes but no acute fracture.  He denies any saddle anesthesia.  He denies any bowel or bladder incontinence.  He denies any leg weakness or leg numbness.  During his initial hospitalization he did have urinary retention and required a catheter and bethanechol.  However he was able to wean off this and is currently only on his doxazosin.  He is still holding his aspirin.  At that time, my plan was: My biggest concern is his recent fever, elevated white blood cell count, and left-sided pleural effusion.  He has been on doxycycline since the 15th.  He continues to report pain in his left chest that is getting worse.  However he believes this is due to  the rib fracture.  He denies any cough or hemoptysis or fever.  He does report pleurisy.  Therefore I would like to repeat the chest x-ray to ensure that the pleural effusion is not increasing and that there is not evidence of an empyema or worsening infection.  If the chest x-ray is stable, I plan to see the patient back next week for reevaluation.  Also repeat a CBC to monitor his leukocytosis.  Regarding his traumatic brain injury and subdural hematoma, patient is clinically stable having recently had a CAT scan.  I would repeat CT scan if there is clinical deterioration, worsening confusion, or ataxia.  Otherwise recheck the patient clinically next week.  Monitor his  chronic kidney disease with a BMP.  Rib fracture should heal spontaneously.  Thumb fracture and clavicular fracture being managed by orthopedics.  Continue to hold aspirin until balance has improved and subdural hematoma is resolving.  Hold beta-blocker at the present time as blood pressure is just now starting to increase.  Recheck blood pressure next week.  09/04/19 Chest x-ray showed no change.  White blood cell count had returned to normal.  Therefore there was no evidence of persistent pneumonia or worsening pleural effusion.  The patient continues to have pain over his ribs on his lower left side consistent with his previous rib fracture.  He does have some mild pleurisy but denies any shortness of breath or hemoptysis.  He denies any fever however he is having soaking night sweats every evening to the point that it soaks the sheets.  This concerns me.  He is also having hyperesthesias in his lower back roughly at the level of L4.  The skin in that area is extremely sensitive to the touch with no visible rash or abnormality.  He reports burning and stinging paresthesias in that area.  X-ray obtained in June showed degenerative disc disease but was otherwise negative for any acute injury.  I question if this may be related to some of his night sweats.  Patient continues to have issues with focus and concentration.  He is repeating questions that he just ask.  He is also having mood swings and is occasionally down and sad.  This is never been an issue for him before and I believe all are sequela of his traumatic brain injury.  He denies any hallucinations.  He is able to perform serial sevens today without any difficulty.  Short-term recall seems to be excellent Past Medical History:  Diagnosis Date  . Allergy   . BPH (benign prostatic hyperplasia)   . BPH (benign prostatic hypertrophy)   . CAD (coronary artery disease)    s/p cypher DES to pLAD 6/08; normal LVF;  ETT-Myoview 2009: no ischemia   .  Coronary artery disease   . Eosinophilia   . High cholesterol   . HTN (hypertension)   . Hyperlipidemia   . Hypertension   . MI (myocardial infarction) ([place])   . Myocardial infarction ([place])   . Prediabetes   . Trigeminal neuralgia    Past Surgical History:  Procedure Laterality Date  . APPENDECTOMY    . CARDIAC CATHETERIZATION  07/30/2006   CORONARY ANGIOPLASTY WITH STENT PLACEMENT  . CARDIAC CATHETERIZATION    . EXPLORATORY LAPAROTOMY     age 65  . EXPLORATORY LAPAROTOMY     Current Outpatient Medications on File Prior to Visit  Medication Sig Dispense Refill  . acetaminophen (TYLENOL) 500 MG tablet Take 1,000 mg by mouth every 6 (six) hours  as needed for moderate pain.    Marland Kitchen albuterol (PROAIR HFA) 108 (90 Base) MCG/ACT inhaler INHALE 2 PUFFS INTO THE LUNGS EVERY 6 HOURS AS NEEDED FOR WHEEZING OR SHORTNESS OF BREATH 6.7 g 0  . amitriptyline (ELAVIL) 10 MG tablet Take 10 mg by mouth at bedtime.    Marland Kitchen atorvastatin (LIPITOR) 40 MG tablet Take 40 mg by mouth daily.    . Calcium-Vitamin D 600-200 MG-UNIT tablet Take 1 tablet by mouth 2 (two) times daily. (Patient taking differently: Take 1 tablet by mouth daily. ) 60 tablet 0  . doxazosin (CARDURA) 2 MG tablet Take 1 tablet (2 mg total) by mouth daily. 30 tablet 0  . doxycycline (VIBRAMYCIN) 100 MG capsule Take 1 capsule (100 mg total) by mouth 2 (two) times daily. One po bid x 7 days 14 capsule 0  . escitalopram (LEXAPRO) 10 MG tablet TAKE 1 TABLET BY MOUTH EVERY DAY 90 tablet 2  . ezetimibe (ZETIA) 10 MG tablet Take 1 tablet (10 mg total) by mouth daily. Please make yearly appt with Dr. [name] for October for future refills. 1st attempt 90 tablet 0  . fluticasone furoate-vilanterol (BREO ELLIPTA) 200-25 MCG/INH AEPB Inhale 1 puff into the lungs daily. 1 each 1  . lidocaine (LIDODERM) 5 % Place 1 patch onto the skin daily. Remove & Discard patch within 12 hours or as directed by MD (Patient taking differently: Place 1 patch onto the skin  daily as needed. Remove & Discard patch within 12 hours or as directed by MD) 30 patch 0  . melatonin 3 MG TABS tablet Take 1 tablet (3 mg total) by mouth at bedtime. 30 tablet 0  . methocarbamol (ROBAXIN) 500 MG tablet Take 1 tablet (500 mg total) by mouth every 6 (six) hours as needed for muscle spasms. 60 tablet 0  . Multiple Vitamin (MULTIVITAMIN WITH MINERALS) TABS tablet Take 1 tablet by mouth daily.    . polyethylene glycol (MIRALAX / GLYCOLAX) 17 g packet Take 17 g by mouth daily. 14 each 0  . traMADol (ULTRAM) 50 MG tablet Take 1 tablet (50 mg total) by mouth every 6 (six) hours as needed. 60 tablet 0  . [DISCONTINUED] fluticasone (FLONASE) 50 MCG/ACT nasal spray USE 2 SPRAYS INTO BOTH NOSTRILS DAILY. (Patient not taking: Reported on 08/20/2019) 48 g 3  . [DISCONTINUED] WIXELA INHUB 250-50 MCG/DOSE AEPB INHALE 1 PUFF BY MOUTH TWICE A DAY (Patient not taking: Reported on 08/20/2019) 180 each 1   No current facility-administered medications on file prior to visit.   Allergies  Allergen Reactions  . Gabapentin     Visual changes   Social History   Socioeconomic History  . Marital status: Married    Spouse name: [name]  . Number of children: 1  . Years of education: Not on file  . Highest education level: Not on file  Occupational History  . Occupation: retired professor    Comment: History   . Occupation: missionary  Tobacco Use  . Smoking status: Never Smoker  . Smokeless tobacco: Never Used  . Tobacco comment: quit over 72yrs ago.  Substance and Sexual Activity  . Alcohol use: Never  . Drug use: Never  . Sexual activity: Yes    Partners: Female  Other Topics Concern  . Not on file  Social History Narrative   ** Merged History Encounter **       Lives with his wife (second marriage in 2015, first marriage ended when his wife died alzheimer's disease). Since  his remarriage, his adult daughter doesn't speak with him.   Social Determinants of Health   Financial Resource  Strain:   . Difficulty of Paying Living Expenses:   Food Insecurity:   . Worried About Charity fundraiser in the Last Year:   . Arboriculturist in the Last Year:   Transportation Needs:   . Film/video editor (Medical):   Marland Kitchen Lack of Transportation (Non-Medical):   Physical Activity:   . Days of Exercise per Week:   . Minutes of Exercise per Session:   Stress:   . Feeling of Stress :   Social Connections:   . Frequency of Communication with Friends and Family:   . Frequency of Social Gatherings with Friends and Family:   . Attends Religious Services:   . Active Member of Clubs or Organizations:   . Attends Archivist Meetings:   Marland Kitchen Marital Status:   Intimate Partner Violence:   . Fear of Current or Ex-Partner:   . Emotionally Abused:   Marland Kitchen Physically Abused:   . Sexually Abused:    Family History  Problem Relation Age of Onset  . Stomach cancer Mother   . Heart disease Father   . Heart disease Brother   . Aortic aneurysm Brother   . Colon cancer Brother   . Coronary artery disease Father 74  . Coronary artery disease Brother   . Aortic aneurysm Brother   . Colon cancer Brother   . Esophageal cancer Neg Hx   . Rectal cancer Neg Hx   . Arthritis Daughter        rheumatoid     Review of Systems  All other systems reviewed and are negative.      Objective:   Physical Exam Vitals reviewed.  Constitutional:      General: He is not in acute distress.    Appearance: He is well-developed. He is not diaphoretic.  HENT:     Head: Normocephalic and atraumatic.     Right Ear: External ear normal.     Left Ear: External ear normal.     Nose: Nose normal.     Mouth/Throat:     Pharynx: No oropharyngeal exudate.  Eyes:     General: No scleral icterus.       Right eye: No discharge.        Left eye: No discharge.     Conjunctiva/sclera: Conjunctivae normal.     Pupils: Pupils are equal, round, and reactive to light.  Neck:     Thyroid: No thyromegaly.      Vascular: No JVD.     Trachea: No tracheal deviation.  Cardiovascular:     Rate and Rhythm: Normal rate and regular rhythm.     Heart sounds: Normal heart sounds. No murmur heard.  No friction rub. No gallop.   Pulmonary:     Effort: Pulmonary effort is normal. No accessory muscle usage, prolonged expiration or respiratory distress.     Breath sounds: Decreased air movement present. No stridor. Examination of the left-middle field reveals decreased breath sounds. Examination of the left-lower field reveals decreased breath sounds. Decreased breath sounds present. No wheezing or rales.    Chest:     Chest wall: Tenderness present. There is dullness to percussion.  Abdominal:     General: Bowel sounds are normal. There is no distension.     Palpations: Abdomen is soft. There is no mass.     Tenderness: There is no abdominal tenderness.  There is no guarding or rebound.  Musculoskeletal:        General: No tenderness. Normal range of motion.     Cervical back: Normal range of motion and neck supple.  Lymphadenopathy:     Cervical: No cervical adenopathy.  Skin:    General: Skin is warm.     Coloration: Skin is not pale.     Findings: No erythema or rash.  Neurological:     Mental Status: He is alert and oriented to person, place, and time.     Cranial Nerves: No cranial nerve deficit.     Motor: No abnormal muscle tone.     Coordination: Coordination abnormal.     Gait: Gait abnormal.     Deep Tendon Reflexes: Reflexes are normal and symmetric.  Psychiatric:        Behavior: Behavior normal.        Thought Content: Thought content normal.        Judgment: Judgment normal.           Assessment & Plan:  Night sweats - Plan: CBC with Differential/Platelet  Traumatic brain injury, with loss of consciousness of 30 minutes or less, subsequent encounter  Pleural effusion on left  Traumatic closed fracture of distal clavicle with minimal displacement, left, initial  encounter  Stage 3b chronic kidney disease  Biggest concern of the night sweats potentially related to an infection or malignancy.  Begin by rechecking his white blood cell count.  If normal, I would recommend an MRI of the lumbar spine given the worsening paresthesias and hyperesthesias he is experiencing around the lower lumbar spine on the left.  Consider work-up for malignancy.  I believe the pleural effusion is stable.  Continue to hold aspirin as his balance is still poor.  Continue to hold Lotrel as his blood pressure remains well controlled.  Discontinue Breo as I do not believe the patient requires this medication

## 2019-09-04 NOTE — Therapy (Signed)
[facility] [address] [place], [place], [address] Phone: [phone]   Fax:  [phone]  Speech Language Pathology Treatment  Patient Details  Name: [name] MRN: [id number] Date of Birth: [birth year] Referring Provider (SLP): Reesa Chew, PA-C   Encounter Date: 09/03/2019   End of Session - 09/04/19 1749    Visit Number 3    Number of Visits 17    Date for SLP Re-Evaluation 11/16/19    SLP Start Time 1618    SLP Stop Time  1700    SLP Time Calculation (min) 42 min    Activity Tolerance Patient tolerated treatment well;Other (comment)   reduced frustration tolerance          Past Medical History:  Diagnosis Date  . Allergy   . BPH (benign prostatic hyperplasia)   . BPH (benign prostatic hypertrophy)   . CAD (coronary artery disease)    s/p cypher DES to pLAD 6/08; normal LVF;  ETT-Myoview 2009: no ischemia   . Coronary artery disease   . Eosinophilia   . High cholesterol   . HTN (hypertension)   . Hyperlipidemia   . Hypertension   . MI (myocardial infarction) ([place])   . Myocardial infarction ([place])   . Prediabetes   . Trigeminal neuralgia     Past Surgical History:  Procedure Laterality Date  . APPENDECTOMY    . CARDIAC CATHETERIZATION  07/30/2006   CORONARY ANGIOPLASTY WITH STENT PLACEMENT  . CARDIAC CATHETERIZATION    . EXPLORATORY LAPAROTOMY     age 86  . EXPLORATORY LAPAROTOMY      There were no vitals filed for this visit.   Subjective Assessment - 09/04/19 1740    Subjective "We did the box."    Currently in Pain? Yes    Pain Score 8     Pain Location Rib cage    Pain Orientation Left;Lower    Pain Descriptors / Indicators Aching    Pain Type Acute pain                 ADULT SLP TREATMENT - 09/04/19 1740      General Information   Behavior/Cognition Alert;Cooperative;Pleasant mood      Treatment Provided   Treatment provided Cognitive-Linquistic       Cognitive-Linquistic Treatment   Treatment focused on Cognition;Patient/family/caregiver education    Skilled Treatment Reviewed with pt and wife med management plan; pt now using pill box and alarms have helped, however last night his alarm went off and "he had no idea why." Has calendar he keeps in a pouch by his chair but wife reports pt does not attempt to access. SLP educated that in order to build procedural memory, pt needs support and daily routine of reviewing this. Requested they bring this next session. Also advised notebook to record details of conversations, etc (therapist recommended this last session, as well). Completed cognitive testing via [facility]; patient with overall moderate-severe impairments per CLQT for age range 84-89. Attention 39/215 (severe), memory 107/185, executive functions 11/40, language 24/37, visuospatial skills 31/105, clock drawing 8/13 (all moderate). Pt had poor awareness of errors, but when he did recognize errors, he was quick to abandon tasks (poor frustration tolerance). Significant impulsivity.      Assessment / Recommendations / Plan   Plan Continue with current plan of care      Progression Toward Goals   Progression toward goals Progressing toward goals  SLP Education - 09/04/19 1748    Education Details deficit areas; will add goals for attention    Person(s) Educated Patient;Spouse    Methods Explanation    Comprehension Verbalized understanding            SLP Short Term Goals - 09/04/19 1753      SLP SHORT TERM GOAL #1   Title pt will demo knowledge of his memory copmensation system by attempt to use with rare min cues in 2 sessions    Time 3    Period Weeks    Status On-going      SLP SHORT TERM GOAL #2   Title pt will demo correct/successful orientation to a memory compensation system in 3 sessions    Time 3    Period Weeks    Status On-going      SLP SHORT TERM GOAL #3   Title pt will tell SLP 2 cognitive deficits  independently, and how they may impact him at home with rare min A in 3 sessions    Time 3    Period Weeks    Status On-going      SLP SHORT TERM GOAL #4   Title pt will undergo further cognitive linguisitic testing PRN    Time 2    Period Weeks   or 7 total sessions   Status Achieved      SLP SHORT TERM GOAL #5   Title Patient will use compensations for attention during functional task (eg conversation or reading on topic of interest) with min cues x 2 sessions.    Time 4    Period Weeks    Status New            SLP Long Term Goals - 09/04/19 1753      SLP LONG TERM GOAL #1   Title pt will demo correct usage of a memory compensation system for appointment and/or medication management, to-do lists, daily schedules in 3 sessions    Time 7    Period Weeks   or 17 total sessions, for all LTGs   Status On-going      SLP LONG TERM GOAL #2   Title pt will demo anticipatory awareness regarding deficit areas, independently, in 3 sessions    Time 7    Period Weeks    Status On-going      SLP LONG TERM GOAL #3   Title Pt will maintain selective attention in functional task for 8 minutes in mod noisy environment with rare min cues x 2 sessions.    Time 7    Period Weeks    Status New            Plan - 09/04/19 1750    Clinical Impression Statement Pt presents today with memory deficits - SLP discussed some compensations for memory for med administration and, generally, a notebook/binder for recall of details of conversations or events. Cognitive testing completed today revealing moderate-severe deficits; attention notably impacts pt ability to comprehend instructions; impulsivity, decreased awareness, and when pt did recognize errors: reduced frustration tolerance. Wife states pt requires more cues at home re: schedule and safety precautions, and pt looked to wife for details on history and details of other therapies (tasks, goals, deficits). Continue skilled ST to increase pt  independence with schedule and daily tasks. Goals updated.    Speech Therapy Frequency 2x / week    Duration --   8 weeks or 17 total sessions   Treatment/Interventions Language facilitation;Cueing hierarchy;Cognitive  reorganization;Internal/external aids;Patient/family education;Compensatory strategies;SLP instruction and feedback;Functional tasks;Environmental controls    Potential to Achieve Goals Good    Consulted and Agree with Plan of Care Patient           Patient will benefit from skilled therapeutic intervention in order to improve the following deficits and impairments:   Cognitive communication deficit    Problem List Patient Active Problem List   Diagnosis Date Noted  . Fracture of multiple ribs with pain 08/10/2019  . Clavicle fracture 08/10/2019  . Closed displaced fracture of phalanx of left thumb, sequela 08/10/2019  . TBI (traumatic brain injury) ([place]) 07/23/2019  . Traumatic closed fracture of distal clavicle with minimal displacement, left, initial encounter 07/19/2019  . Traumatic brain injury with loss of consciousness ([place])   . Multiple trauma   . Benign prostatic hyperplasia   . Essential hypertension   . AKI (acute kidney injury) ([place])   . Stage 3b chronic kidney disease   . Prediabetes   . Acute blood loss anemia   . Thrombocytopenia ([place])   . Fall 07/14/2019  . Type 2 diabetes mellitus ([place])   . Eosinophilia   . Need for prophylactic vaccination and inoculation against influenza 11/12/2013  . Nocturnal leg cramps 11/23/2012  . Pain in joint, shoulder region 11/23/2012  . Fall 11/04/2012  . Hematoma 11/04/2012  . Acute upper respiratory infections of unspecified site 11/04/2012  . HYPERLIPIDEMIA TYPE IIB / III 03/08/2008  . Orthostatic hypotension 03/08/2008  . CAD, NATIVE VESSEL 03/08/2008   Deneise Lever, [place], [place] Speech-Language Pathologist   Aliene Altes 09/04/2019, 7:37 PM  [facility] [address] [place], [place], [address] Phone: [phone]   Fax:  [phone]   Name: SINGLETON HICKOX MRN: [id number] Date of Birth: Jun 04, 1933

## 2019-09-07 ENCOUNTER — Other Ambulatory Visit: Payer: Self-pay | Admitting: Family Medicine

## 2019-09-07 DIAGNOSIS — R61 Generalized hyperhidrosis: Secondary | ICD-10-CM

## 2019-09-07 DIAGNOSIS — M5442 Lumbago with sciatica, left side: Secondary | ICD-10-CM

## 2019-09-08 ENCOUNTER — Ambulatory Visit: Payer: PPO | Attending: Physician Assistant | Admitting: Physical Therapy

## 2019-09-08 ENCOUNTER — Other Ambulatory Visit: Payer: Self-pay

## 2019-09-08 ENCOUNTER — Ambulatory Visit: Payer: PPO | Admitting: Speech Pathology

## 2019-09-08 ENCOUNTER — Ambulatory Visit: Payer: PPO | Admitting: Occupational Therapy

## 2019-09-08 DIAGNOSIS — M25642 Stiffness of left hand, not elsewhere classified: Secondary | ICD-10-CM

## 2019-09-08 DIAGNOSIS — M79642 Pain in left hand: Secondary | ICD-10-CM | POA: Insufficient documentation

## 2019-09-08 DIAGNOSIS — R29818 Other symptoms and signs involving the nervous system: Secondary | ICD-10-CM | POA: Diagnosis not present

## 2019-09-08 DIAGNOSIS — R2681 Unsteadiness on feet: Secondary | ICD-10-CM

## 2019-09-08 DIAGNOSIS — R41841 Cognitive communication deficit: Secondary | ICD-10-CM

## 2019-09-08 DIAGNOSIS — R262 Difficulty in walking, not elsewhere classified: Secondary | ICD-10-CM | POA: Diagnosis not present

## 2019-09-08 DIAGNOSIS — M6281 Muscle weakness (generalized): Secondary | ICD-10-CM

## 2019-09-08 DIAGNOSIS — M25632 Stiffness of left wrist, not elsewhere classified: Secondary | ICD-10-CM | POA: Diagnosis not present

## 2019-09-08 DIAGNOSIS — S069X0A Unspecified intracranial injury without loss of consciousness, initial encounter: Secondary | ICD-10-CM | POA: Diagnosis not present

## 2019-09-08 DIAGNOSIS — R41840 Attention and concentration deficit: Secondary | ICD-10-CM | POA: Insufficient documentation

## 2019-09-08 DIAGNOSIS — R2689 Other abnormalities of gait and mobility: Secondary | ICD-10-CM | POA: Diagnosis not present

## 2019-09-08 NOTE — Patient Instructions (Addendum)
  Opposition (Active)   Touch tip of thumb to nail tip of each finger in turn, making an "O" shape. Repeat __10__ times. Do _4-6___ sessions per day.   MP Flexion (Active)   Bend thumb to touch base of little finger, keeping tip joint straight. Repeat __10-15__ times. Do _4-6___ sessions per day.  Radial Adduction/Abduction (Active)    Move thumb out to side. Move back alongside index finger. Repeat ____ times. Do ____ sessions per day.  Copyright  VHI. All rights reserved.    Composite Flexion (Passive)   Use other hand to bend both joints of thumb at the same time. Hold _10___ seconds. Repeat __5__ times. Do _4-6___ sessions per day.   Composite Extension (Passive)   Using other hand, straighten thumb completely at both joints (AWAY from palm). Hold __10__ seconds. Repeat __5__ times. Do _4-6___ sessions per day.      Abduction (Passive Webspace Stretch)   Stretch thumb away from fingers (to FRONT of palm), using opposite hand. Hold _10___ seconds. Repeat __5__ times. Do __4-6__ sessions per day. Activity: Wrap thumb around coffee mug.*

## 2019-09-08 NOTE — Therapy (Signed)
[facility] [address] [place], [place], [address] Phone: [phone]   Fax:  [phone]  Occupational Therapy Treatment  Patient Details  Name: [name] MRN: [id number] Date of Birth: 1933-11-08 Referring Provider (OT): Reesa Chew   Encounter Date: 09/08/2019   OT End of Session - 09/08/19 1221    Visit Number 4    Number of Visits 17    Date for OT Re-Evaluation 10/27/19    Authorization Type [employer] - Follow Medicare guidelines    Progress Note Due on Visit 10    OT Start Time 1015    OT Stop Time 1100    OT Time Calculation (min) 45 min    Activity Tolerance Patient tolerated treatment well    Behavior During Therapy [facility] for tasks assessed/performed;Impulsive           Past Medical History:  Diagnosis Date  . Allergy   . BPH (benign prostatic hyperplasia)   . BPH (benign prostatic hypertrophy)   . CAD (coronary artery disease)    s/p cypher DES to pLAD 6/08; normal LVF;  ETT-Myoview 2009: no ischemia   . Coronary artery disease   . Eosinophilia   . High cholesterol   . HTN (hypertension)   . Hyperlipidemia   . Hypertension   . MI (myocardial infarction) ([place])   . Myocardial infarction ([place])   . Prediabetes   . Trigeminal neuralgia     Past Surgical History:  Procedure Laterality Date  . APPENDECTOMY    . CARDIAC CATHETERIZATION  07/30/2006   CORONARY ANGIOPLASTY WITH STENT PLACEMENT  . CARDIAC CATHETERIZATION    . EXPLORATORY LAPAROTOMY     age 36  . EXPLORATORY LAPAROTOMY      There were no vitals filed for this visit.   Subjective Assessment - 09/08/19 1024    Patient is accompanied by: Family member    Limitations NWB LUE, Thumb splint    Currently in Pain? No/denies           Pt progressing with Lt thumb motion - issued A/ROM and P/ROM HEP for Lt thumb and pt return demo with cues. Pt opposing to 5th digit now.  Pt performing sit to stand x 10 followed by  simulating clothes management in standing for toileting without any problems or LOB. Pt able to perform night toileting I'ly and safely with BSC.  Performed LE dressing to don/doff shoes and socks with Rt foot crossed over Lt thigh for Rt shoe and sock, but required foot stool to don/doff shoes and socks with Lt leg. Pt mod I in clinic with use of foot stool and encouraged to perform at home for consistency (wife reports they have a foot stool)                     OT Education - 09/08/19 1029    Education Details thumb A/ROM and P/ROM HEP    Person(s) Educated Patient;Spouse    Methods Explanation;Demonstration;Tactile cues;Verbal cues;Handout    Comprehension Verbalized understanding;Returned demonstration;Need further instruction            OT Short Term Goals - 09/03/19 1343      OT SHORT TERM GOAL #1   Title Patient will complete an HEP designed to improve left thumb range of motion with min cueing due 09/27/19    Status Achieved      OT SHORT TERM GOAL #3   Title Patient will don/doff a pull over shirt  with set up assistance    Status Achieved             OT Long Term Goals - 09/08/19 1222      OT LONG TERM GOAL #1   Title Patient will complete HEP/Home activities program to improve functional mobility and use of left hand due 9/21    Time 8    Period Weeks    Status New      OT LONG TERM GOAL #2   Title Patient will bathe and dress himself with modified independence - with increased time    Time 8    Period Weeks    Status On-going   inconsistent with donning socks     OT LONG TERM GOAL #3   Title Patient will prepare a familiar hot meal using left hand as non dominant with supervision    Time 8    Period Weeks    Status On-going   done once at home     OT [place] #4   Title Patient will demonstrate ability to manipulate small items within left hand as evidenced by his ability to complete 9 hole peg test in less than 1 minute    Time 8      Period Weeks    Status New      OT LONG TERM GOAL #5   Title Patient will understand recommendations related to driving    Time 8    Period Weeks    Status New                 Plan - 09/08/19 1223    Clinical Impression Statement Pt with improvements in dynamic standing and Lt thumb ROM    Occupational performance deficits (Please refer to evaluation for details): ADL's;IADL's;Leisure;Rest and Sleep    Body Structure / Function / Physical Skills ADL;Coordination;Endurance;GMC;UE functional use;Sensation;Decreased knowledge of precautions;Balance;Body mechanics;Decreased knowledge of use of DME;Flexibility;IADL;Pain;Strength;FMC;Dexterity;Cardiopulmonary status limiting activity;ROM;Mobility;Tone    Cognitive Skills Attention;Thought;Problem Solve;Safety Awareness;Memory;Energy/Drive    OT Frequency 2x / week    OT Duration 8 weeks    OT Treatment/Interventions Self-care/ADL training;Electrical Stimulation;Iontophoresis;Therapeutic exercise;Moist Heat;Paraffin;Neuromuscular education;Splinting;Patient/family education;Balance training;Therapeutic activities;Functional Mobility Training;Fluidtherapy;Cryotherapy;Ultrasound;DME and/or AE instruction;Manual Therapy;Passive range of motion;Cognitive remediation/compensation    Plan coordination Lt hand, begin light strengthening to Lt hand    Consulted and Agree with Plan of Care Patient;Family member/caregiver           Patient will benefit from skilled therapeutic intervention in order to improve the following deficits and impairments:   Body Structure / Function / Physical Skills: ADL, Coordination, Endurance, GMC, UE functional use, Sensation, Decreased knowledge of precautions, Balance, Body mechanics, Decreased knowledge of use of DME, Flexibility, IADL, Pain, Strength, FMC, Dexterity, Cardiopulmonary status limiting activity, ROM, Mobility, Tone Cognitive Skills: Attention, Thought, Problem Solve, Safety Awareness, Memory,  Energy/Drive     Visit Diagnosis: Stiffness of left hand, not elsewhere classified  Unsteadiness on feet  Muscle weakness (generalized)    Problem List Patient Active Problem List   Diagnosis Date Noted  . Fracture of multiple ribs with pain 08/10/2019  . Clavicle fracture 08/10/2019  . Closed displaced fracture of phalanx of left thumb, sequela 08/10/2019  . TBI (traumatic brain injury) ([place]) 07/23/2019  . Traumatic closed fracture of distal clavicle with minimal displacement, left, initial encounter 07/19/2019  . Traumatic brain injury with loss of consciousness ([place])   . Multiple trauma   . Benign prostatic hyperplasia   . Essential hypertension   .  AKI (acute kidney injury) ([place])   . Stage 3b chronic kidney disease   . Prediabetes   . Acute blood loss anemia   . Thrombocytopenia ([place])   . Fall 07/14/2019  . Type 2 diabetes mellitus ([place])   . Eosinophilia   . Need for prophylactic vaccination and inoculation against influenza 11/12/2013  . Nocturnal leg cramps 11/23/2012  . Pain in joint, shoulder region 11/23/2012  . Fall 11/04/2012  . Hematoma 11/04/2012  . Acute upper respiratory infections of unspecified site 11/04/2012  . HYPERLIPIDEMIA TYPE IIB / III 03/08/2008  . Orthostatic hypotension 03/08/2008  . CAD, NATIVE VESSEL 03/08/2008    Carey Bullocks, OTR/L 09/08/2019, 12:24 PM  [facility] [address] [place], [place], [address] Phone: [phone]   Fax:  [phone]  Name: [name] MRN: [id number] Date of Birth: October 02, 1933

## 2019-09-08 NOTE — Therapy (Signed)
[facility] [address] [place], [place], [address] Phone: [phone]   Fax:  [phone]  Speech Language Pathology Treatment  Patient Details  Name: [name] MRN: [id number] Date of Birth: 1933-03-02 Referring Provider (SLP): Reesa Chew, PA-C   Encounter Date: 09/08/2019   End of Session - 09/08/19 1025    Visit Number 4    Number of Visits 17    Date for SLP Re-Evaluation 11/16/19    SLP Start Time 0931    SLP Stop Time  1013    SLP Time Calculation (min) 42 min    Activity Tolerance Patient tolerated treatment well           Past Medical History:  Diagnosis Date  . Allergy   . BPH (benign prostatic hyperplasia)   . BPH (benign prostatic hypertrophy)   . CAD (coronary artery disease)    s/p cypher DES to pLAD 6/08; normal LVF;  ETT-Myoview 2009: no ischemia   . Coronary artery disease   . Eosinophilia   . High cholesterol   . HTN (hypertension)   . Hyperlipidemia   . Hypertension   . MI (myocardial infarction) ([place])   . Myocardial infarction ([place])   . Prediabetes   . Trigeminal neuralgia     Past Surgical History:  Procedure Laterality Date  . APPENDECTOMY    . CARDIAC CATHETERIZATION  07/30/2006   CORONARY ANGIOPLASTY WITH STENT PLACEMENT  . CARDIAC CATHETERIZATION    . EXPLORATORY LAPAROTOMY     age 4  . EXPLORATORY LAPAROTOMY      There were no vitals filed for this visit.   Subjective Assessment - 09/08/19 0935    Subjective "I brought you a calendar."    Currently in Pain? No/denies                 ADULT SLP TREATMENT - 09/08/19 0935      General Information   Behavior/Cognition Alert;Cooperative;Pleasant mood      Treatment Provided   Treatment provided Cognitive-Linquistic      Pain Assessment   Pain Assessment No/denies pain      Cognitive-Linquistic Treatment   Treatment focused on Cognition;Patient/family/caregiver education    Skilled Treatment  Patient brought monthly calendar; wife had written in appointments and activities, pt had written 2 tasks (bank, birds). Pt looked at calendar for today and saw "K leaves" and told SLP, "I guess I'm raking leaves today," (rainy summer day). Wife reported this was in reference to daughter leaving to start a new job in [place]; she and pt had talked about this twice already today. Wife reported pt had some reluctance to adopting calendar use; pt stated he felt this may be "beneath him." SLP worked with pt and wife to ID a schedule that pt might like to use that would also allow him to access more information about his day. When instructed to write the month, pt wrote, February at the top of the page; mod cues for error awareness and to check his existing calendar for details. Usual mod-max A required when transferring dates and appointment information to weekly template. Encouraged wife to assist/cue pt for writing more detailed information when necessary. He added details about K's departure with mod question cues. Encouraged pt/wife to discuss schedule daily in the morning and have pt write down planned activities for the day, reference the schedule again mid-day to check off completed tasks. Home tasks assigned.       Assessment / Recommendations /  Plan   Plan Continue with current plan of care      Progression Toward Goals   Progression toward goals Progressing toward goals            SLP Education - 09/08/19 1024    Education Details deficit areas, use of calendar/schedule    Person(s) Educated Patient;Spouse    Methods Explanation    Comprehension Verbalized understanding            SLP Short Term Goals - 09/08/19 1028      SLP SHORT TERM GOAL #1   Title pt will demo knowledge of his memory copmensation system by attempt to use with rare min cues in 2 sessions    Time 2    Period Weeks    Status On-going      SLP SHORT TERM GOAL #2   Title pt will demo correct/successful  orientation to a memory compensation system in 3 sessions    Time 2    Period Weeks    Status On-going      SLP SHORT TERM GOAL #3   Title pt will tell SLP 2 cognitive deficits independently, and how they may impact him at home with rare min A in 3 sessions    Time 2    Period Weeks    Status On-going      SLP SHORT TERM GOAL #4   Title pt will undergo further cognitive linguisitic testing PRN    Time 2    Period Weeks   or 7 total sessions   Status Achieved      SLP SHORT TERM GOAL #5   Title Patient will use compensations for attention during functional task (eg conversation or reading on topic of interest) with min cues x 2 sessions.    Time 3    Period Weeks    Status On-going            SLP Long Term Goals - 09/08/19 1028      SLP LONG TERM GOAL #1   Title pt will demo correct usage of a memory compensation system for appointment and/or medication management, to-do lists, daily schedules in 3 sessions    Time 6    Period Weeks   or 17 total sessions, for all LTGs   Status On-going      SLP LONG TERM GOAL #2   Title pt will demo anticipatory awareness regarding deficit areas, independently, in 3 sessions    Time 6    Period Weeks    Status On-going      SLP LONG TERM GOAL #3   Title Pt will maintain selective attention in functional task for 8 minutes in mod noisy environment with rare min cues x 2 sessions.    Time 6    Period Weeks    Status New            Plan - 09/08/19 1025    Clinical Impression Statement Pt presents today with moderate-severe cognitive communication deficits; most significantly in attention and memory. Impulsivity, decreased awareness, and reduced frustration tolerance are also noted. Wife states pt requires more cues at home re: schedule and safety precautions, and pt looked to wife for details on history and details of other therapies (tasks, goals, deficits). Continue skilled ST to increase pt independence with schedule and daily  tasks. Goals updated.    Speech Therapy Frequency 2x / week    Duration --   8 weeks or 17 total sessions  Treatment/Interventions Language facilitation;Cueing hierarchy;Cognitive reorganization;Internal/external aids;Patient/family education;Compensatory strategies;SLP instruction and feedback;Functional tasks;Environmental controls    Potential to Achieve Goals Good    Consulted and Agree with Plan of Care Patient           Patient will benefit from skilled therapeutic intervention in order to improve the following deficits and impairments:   Cognitive communication deficit    Problem List Patient Active Problem List   Diagnosis Date Noted  . Fracture of multiple ribs with pain 08/10/2019  . Clavicle fracture 08/10/2019  . Closed displaced fracture of phalanx of left thumb, sequela 08/10/2019  . TBI (traumatic brain injury) ([place]) 07/23/2019  . Traumatic closed fracture of distal clavicle with minimal displacement, left, initial encounter 07/19/2019  . Traumatic brain injury with loss of consciousness ([place])   . Multiple trauma   . Benign prostatic hyperplasia   . Essential hypertension   . AKI (acute kidney injury) ([place])   . Stage 3b chronic kidney disease   . Prediabetes   . Acute blood loss anemia   . Thrombocytopenia ([place])   . Fall 07/14/2019  . Type 2 diabetes mellitus ([place])   . Eosinophilia   . Need for prophylactic vaccination and inoculation against influenza 11/12/2013  . Nocturnal leg cramps 11/23/2012  . Pain in joint, shoulder region 11/23/2012  . Fall 11/04/2012  . Hematoma 11/04/2012  . Acute upper respiratory infections of unspecified site 11/04/2012  . HYPERLIPIDEMIA TYPE IIB / III 03/08/2008  . Orthostatic hypotension 03/08/2008  . CAD, NATIVE VESSEL 03/08/2008   Deneise Lever, [place], [place] Speech-Language Pathologist  Aliene Altes 09/08/2019, 10:29 AM  [facility] [address] [place], [place], [address] Phone: [phone]   Fax:  [phone]   Name: [name] MRN: [id number] Date of Birth: 1933/06/08

## 2019-09-08 NOTE — Therapy (Signed)
[facility] [address] [place], [place], [address] Phone: [phone]   Fax:  [phone]  Physical Therapy Treatment  Patient Details  Name: [name] MRN: [id number] Date of Birth: [birth year] Referring Provider (PT): Reesa Chew PA-C   Encounter Date: 09/08/2019   PT End of Session - 09/08/19 1229    Visit Number 5    Number of Visits 17    Date for PT Re-Evaluation 11/12/19   written for 60 day POC   Authorization Type [employer] P[employer] - $15 co pay, collect 1 co pay per day for multiple disciplines    PT Start Time 1100    PT Stop Time 1140    PT Time Calculation (min) 40 min    Equipment Utilized During Treatment Gait belt    Activity Tolerance Patient tolerated treatment well;Patient limited by fatigue    Behavior During Therapy [facility] for tasks assessed/performed;Impulsive           Past Medical History:  Diagnosis Date  . Allergy   . BPH (benign prostatic hyperplasia)   . BPH (benign prostatic hypertrophy)   . CAD (coronary artery disease)    s/p cypher DES to pLAD 6/08; normal LVF;  ETT-Myoview 2009: no ischemia   . Coronary artery disease   . Eosinophilia   . High cholesterol   . HTN (hypertension)   . Hyperlipidemia   . Hypertension   . MI (myocardial infarction) ([place])   . Myocardial infarction ([place])   . Prediabetes   . Trigeminal neuralgia     Past Surgical History:  Procedure Laterality Date  . APPENDECTOMY    . CARDIAC CATHETERIZATION  07/30/2006   CORONARY ANGIOPLASTY WITH STENT PLACEMENT  . CARDIAC CATHETERIZATION    . EXPLORATORY LAPAROTOMY     age 86  . EXPLORATORY LAPAROTOMY      There were no vitals filed for this visit.   Subjective Assessment - 09/08/19 1105    Subjective Saw his PCP the other day. States that his WBC are good. Will be getting an MRI of his low back at the end of the month. Exercises are going well - thinks they are too easy. No reports of  dizziness. Pretty pleased with his progress so far.    Patient is accompained by: Family member    Pertinent History PMH: BPH, CAD, HTN, CKD III, prediabetes, mild cognitive impairment.    Patient Stated Goals wants to walk without worrying about potentially having a dizzy spell - reports 4-5 spells a day that lasts only a couple of seconds.    Currently in Pain? No/denies    Pain Onset More than a month ago                             [facility] Adult PT Treatment/Exercise - 09/08/19 0001      Transfers   Transfers Sit to Stand;Stand to Sit    Sit to Stand 4: Min guard    Stand to Sit 4: Min guard    Comments on blue foam beam x10 reps, initially with use of R hand pushing up from mat and then progressing to no UE support               Balance Exercises - 09/08/19 0001      Balance Exercises: Standing   SLS Eyes open;Upper extremity support 1;Foam/compliant surface;Limitations   RUE support   SLS Limitations alternating toe  taps to colorful floor bubbles x10 reps B, cues for slowed and controlled for SLS    SLS with Vectors Solid surface;Foam/compliant surface;Limitations    SLS with Vectors Limitations alternating SLS toe taps to 6" step with no UE support on level ground, then progressing to performing on 1" foam and x10 reps B with intermittent UE support for balance    Stepping Strategy Anterior;Lateral;Foam/compliant surface;UE support;Limitations    Stepping Strategy Limitations alternating feet x10 reps anterior stepping with cues for foot clearance and single UE support, lateral side stepping off beam beginning with single RUE support and then progressing to none with cues to slow down at times x12 reps B    Step Ups Forward;6 inch;UE support 1;Limitations    Step Ups Limitations single RUE support x10 reps with LLE, with no UE support x10 reps leading with RLE, with no UE support x10 reps leading with LLE (initial min guard for balance)                PT Short Term Goals - 09/04/19 1136      PT SHORT TERM GOAL #1   Title Pt will be independent with initial HEP in order to build upon functional gains made in therapy. 09/14/19    Time 4    Period Weeks    Status On-going    Target Date 09/14/19      PT SHORT TERM GOAL #2   Title Pt will improve DGI score to at least a 14/24 in order to demo decr fall risk.    Baseline 08/24/19: baseline score of 11/24 today    Status Revised      PT SHORT TERM GOAL #3   Title Pt and pt's spouse will verbalize understanding of fall prevention in the home.    Time 4    Period Weeks    Status On-going      PT SHORT TERM GOAL #4   Title Stairs to be assessed as appropriate - LTG to be written as appropriate.    Baseline 09/03/19: min guard assist with single rail sideways for 4 stairs x 4 reps in session today, step to pattern.    Status Achieved      PT SHORT TERM GOAL #5   Title Pt will perform 5x sit <> stand with no BLE bracing against mat table/chair and improved eccentric control in order  to demo improved safety with transfers.    Time 4    Period Weeks    Status On-going             PT Long Term Goals - 09/07/19 0912      PT LONG TERM GOAL #1   Title Pt will be independent with final HEP in order to build upon functional gains made in therapy. ALL LTGS DUE 10/12/19    Time 8    Period Weeks    Status New      PT LONG TERM GOAL #2   Title Pt will perform at least 12 steps with step to pattern vs. step through pattern with single handrail with supervision in order to safely enter/exit home and go to 2nd floor.    Time 8    Period Weeks    Status New      PT LONG TERM GOAL #3   Title Pt will improve DGI score to at least a 17/24 in order to demo decr fall risk.    Baseline 11/24    Time 8  Period Weeks    Status Revised      PT LONG TERM GOAL #4   Title Pt will perform TUG with no AD vs. LRAD in 13.5 seconds or less in order to demo decr fall risk.    Time 8    Period Weeks      Status New      PT LONG TERM GOAL #5   Title Pt will ambulate at least 500' with supervision over level indoor and unlevel outdoor surfaces in order to demo improved community mobility.    Time 8    Period Weeks    Status New                 Plan - 09/08/19 1403    Clinical Impression Statement Focus of today's skilled session was BLE strengthening and balance strategies with decreasing UE support. Rest breaks taken as needed as pt fatigues easily. Pt's HR incr to 125-130 bpm with activity and decr back down to high 80s with rest. Will continue to progress towards LTGs.    Personal Factors and Comorbidities Comorbidity 3+;Past/Current Experience;Behavior Pattern    Comorbidities BPH, CAD, HTN, CKD III, prediabetes, mild cognitive impairment,    Examination-Activity Limitations Transfers;Stairs;Locomotion Level    Examination-Participation Restrictions Community Activity    Stability/Clinical Decision Making Evolving/Moderate complexity    Rehab Potential Good    PT Frequency 2x / week    PT Duration 8 weeks    PT Treatment/Interventions ADLs/Self Care Home Management;DME Instruction;Stair training;Therapeutic activities;Functional mobility training;Gait training;Therapeutic exercise;Balance training;Neuromuscular re-education;Patient/family education;Vestibular    PT Next Visit Plan upgrade HEP as appropriate. continue gait training with LRAD/walking stick, balance and strengthening ex's.    PT Home Exercise Plan Access Code: [email or web address]    Consulted and Agree with Plan of Care Patient;Family member/caregiver    Family Member Consulted pt's spouse           Patient will benefit from skilled therapeutic intervention in order to improve the following deficits and impairments:  Abnormal gait, Decreased balance, Decreased activity tolerance, Decreased coordination, Decreased knowledge of use of DME, Decreased safety awareness, Decreased strength, Difficulty walking, Dizziness,  Impaired UE functional use  Visit Diagnosis: Muscle weakness (generalized)  Unsteadiness on feet  Other symptoms and signs involving the nervous system  Difficulty in walking, not elsewhere classified  Other abnormalities of gait and mobility     Problem List Patient Active Problem List   Diagnosis Date Noted  . Fracture of multiple ribs with pain 08/10/2019  . Clavicle fracture 08/10/2019  . Closed displaced fracture of phalanx of left thumb, sequela 08/10/2019  . TBI (traumatic brain injury) ([place]) 07/23/2019  . Traumatic closed fracture of distal clavicle with minimal displacement, left, initial encounter 07/19/2019  . Traumatic brain injury with loss of consciousness ([place])   . Multiple trauma   . Benign prostatic hyperplasia   . Essential hypertension   . AKI (acute kidney injury) ([place])   . Stage 3b chronic kidney disease   . Prediabetes   . Acute blood loss anemia   . Thrombocytopenia ([place])   . Fall 07/14/2019  . Type 2 diabetes mellitus ([place])   . Eosinophilia   . Need for prophylactic vaccination and inoculation against influenza 11/12/2013  . Nocturnal leg cramps 11/23/2012  . Pain in joint, shoulder region 11/23/2012  . Fall 11/04/2012  . Hematoma 11/04/2012  . Acute upper respiratory infections of unspecified site 11/04/2012  . HYPERLIPIDEMIA TYPE IIB / III 03/08/2008  . Orthostatic hypotension  03/08/2008  . CAD, NATIVE VESSEL 03/08/2008    Arliss Journey , PT, DPT  09/08/2019, 2:05 PM  [facility] [address] [place], [place], [address] Phone: [phone]   Fax:  [phone]  Name: [name] MRN: [id number] Date of Birth: Jun 09, 1933

## 2019-09-10 ENCOUNTER — Encounter: Payer: Self-pay | Admitting: Physical Therapy

## 2019-09-10 ENCOUNTER — Ambulatory Visit: Payer: PPO | Admitting: Occupational Therapy

## 2019-09-10 ENCOUNTER — Encounter: Payer: Self-pay | Admitting: Occupational Therapy

## 2019-09-10 ENCOUNTER — Ambulatory Visit: Payer: PPO | Admitting: Speech Pathology

## 2019-09-10 ENCOUNTER — Ambulatory Visit: Payer: PPO | Admitting: Physical Therapy

## 2019-09-10 ENCOUNTER — Other Ambulatory Visit: Payer: Self-pay

## 2019-09-10 DIAGNOSIS — M6281 Muscle weakness (generalized): Secondary | ICD-10-CM

## 2019-09-10 DIAGNOSIS — R41841 Cognitive communication deficit: Secondary | ICD-10-CM

## 2019-09-10 DIAGNOSIS — M25632 Stiffness of left wrist, not elsewhere classified: Secondary | ICD-10-CM

## 2019-09-10 DIAGNOSIS — R262 Difficulty in walking, not elsewhere classified: Secondary | ICD-10-CM

## 2019-09-10 DIAGNOSIS — R29818 Other symptoms and signs involving the nervous system: Secondary | ICD-10-CM

## 2019-09-10 DIAGNOSIS — R2689 Other abnormalities of gait and mobility: Secondary | ICD-10-CM

## 2019-09-10 DIAGNOSIS — M25642 Stiffness of left hand, not elsewhere classified: Secondary | ICD-10-CM

## 2019-09-10 DIAGNOSIS — M79642 Pain in left hand: Secondary | ICD-10-CM

## 2019-09-10 DIAGNOSIS — S069X0A Unspecified intracranial injury without loss of consciousness, initial encounter: Secondary | ICD-10-CM

## 2019-09-10 DIAGNOSIS — R2681 Unsteadiness on feet: Secondary | ICD-10-CM

## 2019-09-10 NOTE — Patient Instructions (Addendum)

## 2019-09-10 NOTE — Patient Instructions (Addendum)
  1. Grip Strengthening (Resistive Putty)   Squeeze putty using thumb and all fingers. Repeat _20___ times. Do __2__ sessions per day.      Copyright  VHI. All rights reserved.    Palmar Pinch Strengthening (Resistive Putty)    Pinch putty between thumb index and middle finger turns Repeat __10__ times. Do _1___ sessions per day.  Copyright  VHI. All rights reserved.  Lateral Pinch Strengthening (Resistive Putty)    Squeeze between thumb and side of each finger in turn. Repeat _10___ times. Do __1__ sessions per day.  Copyright  VHI. All rights reserved.   Coordination Activities  Perform the following activities for 10  minutes 1 times per day with left hand(s).   Rotate ball in fingertips (clockwise and counter-clockwise).  Deal cards with your thumb (Hold deck in hand and push card off top with thumb).  Pick up coins and stack.  Pick up coins one at a time until you get 5-10 in your hand, then move coins from palm to fingertips to place in container one at a time.

## 2019-09-10 NOTE — Therapy (Signed)
[facility] [address] [place], [place], [address] Phone: [phone]   Fax:  [phone]  Physical Therapy Treatment  Patient Details  Name: [name] MRN: [id number] Date of Birth: 1933-05-01 Referring Provider (PT): Reesa Chew PA-C   Encounter Date: 09/10/2019   PT End of Session - 09/10/19 1019    Visit Number 6    Number of Visits 17    Date for PT Re-Evaluation 11/12/19   written for 60 day POC   Authorization Type [employer] P[employer] - $15 co pay, collect 1 co pay per day for multiple disciplines    PT Start Time 1017    PT Stop Time 1100    PT Time Calculation (min) 43 min    Equipment Utilized During Treatment Gait belt    Activity Tolerance Patient tolerated treatment well;Patient limited by fatigue    Behavior During Therapy [facility] for tasks assessed/performed;Impulsive           Past Medical History:  Diagnosis Date  . Allergy   . BPH (benign prostatic hyperplasia)   . BPH (benign prostatic hypertrophy)   . CAD (coronary artery disease)    s/p cypher DES to pLAD 6/08; normal LVF;  ETT-Myoview 2009: no ischemia   . Coronary artery disease   . Eosinophilia   . High cholesterol   . HTN (hypertension)   . Hyperlipidemia   . Hypertension   . MI (myocardial infarction) ([place])   . Myocardial infarction ([place])   . Prediabetes   . Trigeminal neuralgia     Past Surgical History:  Procedure Laterality Date  . APPENDECTOMY    . CARDIAC CATHETERIZATION  07/30/2006   CORONARY ANGIOPLASTY WITH STENT PLACEMENT  . CARDIAC CATHETERIZATION    . EXPLORATORY LAPAROTOMY     age 86  . EXPLORATORY LAPAROTOMY      There were no vitals filed for this visit.   Subjective Assessment - 09/10/19 1017    Subjective No new complaints. No falls. Some left hand pain from working with OT.    Patient is accompained by: Family member   spouse   Pertinent History PMH: BPH, CAD, HTN, CKD III, prediabetes,  mild cognitive impairment.    Patient Stated Goals wants to walk without worrying about potentially having a dizzy spell - reports 4-5 spells a day that lasts only a couple of seconds.    Currently in Pain? Yes    Pain Score 3     Pain Location Other (Comment)   thumb   Pain Orientation Left    Pain Descriptors / Indicators Aching;Sore;Tender    Pain Type Acute pain    Pain Onset More than a month ago    Pain Frequency Intermittent    Aggravating Factors  certain movements    Pain Relieving Factors rest              [facility] PT Assessment - 09/10/19 1038      Dynamic Gait Index   Level Surface Normal    Change in Gait Speed Normal    Gait with Horizontal Head Turns Normal    Gait with Vertical Head Turns Normal    Gait and Pivot Turn Normal    Step Over Obstacle Severe Impairment    Step Around Obstacles Mild Impairment    Steps Moderate Impairment    Total Score 18    DGI comment: <19 predictive of falls  [facility] Adult PT Treatment/Exercise - 09/10/19 1038      Transfers   Transfers Sit to Stand;Stand to Sit    Sit to Stand 4: Min guard    Five time sit to stand comments  9.32 sec's with no UE support, no retropulsion and improved control with sitting down.     Stand to Sit 4: Min guard      Ambulation/Gait   Ambulation/Gait Yes    Ambulation/Gait Assistance 5: Supervision;4: Min guard    Ambulation/Gait Assistance Details around gym with session with no device, use of walking stick to enter/exit session.    Assistive device Other (Comment);None   walking stick   Gait Pattern Step-through pattern;Decreased stride length;Decreased arm swing - left    Ambulation Surface Level;Indoor      Self-Care   Self-Care Other Self-Care Comments    Other Self-Care Comments  HEP reviewed with pt and spouse. no issues reported and both state it is still challenging. Also educated both pt and spouse on fall prevention strategies for the home. Handout provided. Both  without questions afterwards.                Balance Exercises - 09/10/19 1049      Balance Exercises: Standing   Standing Eyes Closed Narrow base of support (BOS);Wide (BOA);Head turns;Foam/compliant surface;Other reps (comment);30 secs;Limitations    Standing Eyes Closed Limitations on airex with no UE support, up to min assist needed for balance: feet together no head movements, progressing to feet apart for head movements, all with EC. no UE support with occasional touch to chair back for balance. min guard to min assist for balance.      SLS with Vectors Solid surface;Upper extremity assist 1;Other reps (comment);Limitations    SLS with Vectors Limitations on floor- alternating fwd foot taps, then alternating cross foot taps to two tall cones. cues on stance position and weight shifitng. min guard assist for balance with HHA on one side.              PT Education - 09/10/19 1726    Education Details fall prevention strategies    Person(s) Educated Patient;Spouse    Methods Explanation;Demonstration;Handout    Comprehension Verbalized understanding;Returned demonstration            PT Short Term Goals - 09/10/19 1020      PT SHORT TERM GOAL #1   Title Pt will be independent with initial HEP in order to build upon functional gains made in therapy. 09/14/19    Baseline 09/10/19: met with current program which spouse/pt report are still challenging.    Status Achieved    Target Date 09/14/19      PT SHORT TERM GOAL #2   Title Pt will improve DGI score to at least a 14/24 in order to demo decr fall risk.    Baseline 09/10/19: 18/24 scored today    Status Achieved      PT SHORT TERM GOAL #3   Title Pt and pt's spouse will verbalize understanding of fall prevention in the home.    Baseline 09/10/19: met in session today    Time --    Period --    Status Achieved      PT SHORT TERM GOAL #4   Title Stairs to be assessed as appropriate - LTG to be written as appropriate.     Baseline 09/03/19: min guard assist with single rail sideways for 4 stairs x 4 reps in session today, step to  pattern.    Status Achieved      PT SHORT TERM GOAL #5   Title Pt will perform 5x sit <> stand with no BLE bracing against mat table/chair and improved eccentric control in order  to demo improved safety with transfers.    Baseline 09/10/19: met in session today with time of 9.32 sec's with no UE assist, no retropulsion    Time --    Period --    Status Achieved             PT Long Term Goals - 09/07/19 0912      PT LONG TERM GOAL #1   Title Pt will be independent with final HEP in order to build upon functional gains made in therapy. ALL LTGS DUE 10/12/19    Time 8    Period Weeks    Status New      PT LONG TERM GOAL #2   Title Pt will perform at least 12 steps with step to pattern vs. step through pattern with single handrail with supervision in order to safely enter/exit home and go to 2nd floor.    Time 8    Period Weeks    Status New      PT LONG TERM GOAL #3   Title Pt will improve DGI score to at least a 17/24 in order to demo decr fall risk.    Baseline 11/24    Time 8    Period Weeks    Status Revised      PT LONG TERM GOAL #4   Title Pt will perform TUG with no AD vs. LRAD in 13.5 seconds or less in order to demo decr fall risk.    Time 8    Period Weeks    Status New      PT LONG TERM GOAL #5   Title Pt will ambulate at least 500' with supervision over level indoor and unlevel outdoor surfaces in order to demo improved community mobility.    Time 8    Period Weeks    Status New                 Plan - 09/10/19 1019    Clinical Impression Statement Today's skilled session initially focused on progress toward STGs with all goals met. Pt scored 18/24 on the Dynamic Gait Index and 9.32 sec's with 5 time sit to stand with good form. Session also reviewed current HEP with no issues reported and provided education on fall prevention strategies with no  questions from pt or spouse afterwards. Remainder of session continued to focus on balance reactions on compliant surfaces. The pt is progressing and should benefit from continued PT to progress toward unmet goals.    Personal Factors and Comorbidities Comorbidity 3+;Past/Current Experience;Behavior Pattern    Comorbidities BPH, CAD, HTN, CKD III, prediabetes, mild cognitive impairment,    Examination-Activity Limitations Transfers;Stairs;Locomotion Level    Examination-Participation Restrictions Community Activity    Stability/Clinical Decision Making Evolving/Moderate complexity    Rehab Potential Good    PT Frequency 2x / week    PT Duration 8 weeks    PT Treatment/Interventions ADLs/Self Care Home Management;DME Instruction;Stair training;Therapeutic activities;Functional mobility training;Gait training;Therapeutic exercise;Balance training;Neuromuscular re-education;Patient/family education;Vestibular    PT Next Visit Plan upgrade HEP as appropriate. continue gait training with LRAD/walking stick, balance and strengthening ex's.    PT Home Exercise Plan Access Code: [email or web address]    Consulted and Agree with Plan of Care Patient;Family member/caregiver  Family Member Consulted pt's spouse           Patient will benefit from skilled therapeutic intervention in order to improve the following deficits and impairments:  Abnormal gait, Decreased balance, Decreased activity tolerance, Decreased coordination, Decreased knowledge of use of DME, Decreased safety awareness, Decreased strength, Difficulty walking, Dizziness, Impaired UE functional use  Visit Diagnosis: Unsteadiness on feet  Muscle weakness (generalized)  Other symptoms and signs involving the nervous system  Difficulty in walking, not elsewhere classified  Other abnormalities of gait and mobility     Problem List Patient Active Problem List   Diagnosis Date Noted  . Fracture of multiple ribs with pain 08/10/2019  .  Clavicle fracture 08/10/2019  . Closed displaced fracture of phalanx of left thumb, sequela 08/10/2019  . TBI (traumatic brain injury) ([place]) 07/23/2019  . Traumatic closed fracture of distal clavicle with minimal displacement, left, initial encounter 07/19/2019  . Traumatic brain injury with loss of consciousness ([place])   . Multiple trauma   . Benign prostatic hyperplasia   . Essential hypertension   . AKI (acute kidney injury) ([place])   . Stage 3b chronic kidney disease   . Prediabetes   . Acute blood loss anemia   . Thrombocytopenia ([place])   . Fall 07/14/2019  . Type 2 diabetes mellitus ([place])   . Eosinophilia   . Need for prophylactic vaccination and inoculation against influenza 11/12/2013  . Nocturnal leg cramps 11/23/2012  . Pain in joint, shoulder region 11/23/2012  . Fall 11/04/2012  . Hematoma 11/04/2012  . Acute upper respiratory infections of unspecified site 11/04/2012  . HYPERLIPIDEMIA TYPE IIB / III 03/08/2008  . Orthostatic hypotension 03/08/2008  . CAD, NATIVE VESSEL 03/08/2008    Willow Ora, PTA, [facility] [address], [place], [place] [address] [phone] 09/10/19, 5:33 PM   Name: JAMAL PAVON MRN: [id number] Date of Birth: 1933-02-14

## 2019-09-10 NOTE — Therapy (Signed)
[facility] [address] [place], [place], [address] Phone: [phone]   Fax:  [phone]  Speech Language Pathology Treatment  Patient Details  Name: [name] MRN: [id number] Date of Birth: 08-Mar-1933 Referring Provider (SLP): Reesa Chew, PA-C   Encounter Date: 09/10/2019   End of Session - 09/10/19 0954    Visit Number 5    Number of Visits 17    Date for SLP Re-Evaluation 11/16/19    SLP Start Time 0851    SLP Stop Time  0932    SLP Time Calculation (min) 41 min    Activity Tolerance Patient tolerated treatment well           Past Medical History:  Diagnosis Date  . Allergy   . BPH (benign prostatic hyperplasia)   . BPH (benign prostatic hypertrophy)   . CAD (coronary artery disease)    s/p cypher DES to pLAD 6/08; normal LVF;  ETT-Myoview 2009: no ischemia   . Coronary artery disease   . Eosinophilia   . High cholesterol   . HTN (hypertension)   . Hyperlipidemia   . Hypertension   . MI (myocardial infarction) ([place])   . Myocardial infarction ([place])   . Prediabetes   . Trigeminal neuralgia     Past Surgical History:  Procedure Laterality Date  . APPENDECTOMY    . CARDIAC CATHETERIZATION  07/30/2006   CORONARY ANGIOPLASTY WITH STENT PLACEMENT  . CARDIAC CATHETERIZATION    . EXPLORATORY LAPAROTOMY     age 58  . EXPLORATORY LAPAROTOMY      There were no vitals filed for this visit.   Subjective Assessment - 09/10/19 0854    Subjective Pt attempted to walk down to gym vs ST room.    Patient is accompained by: Family member   wife   Currently in Pain? Yes    Pain Score 2     Pain Location Rib cage    Pain Orientation Left    Pain Descriptors / Indicators Aching    Pain Type Acute pain    Pain Onset More than a month ago                 ADULT SLP TREATMENT - 09/10/19 0947      General Information   Behavior/Cognition Alert;Cooperative;Pleasant mood      Treatment  Provided   Treatment provided Cognitive-Linquistic      Pain Assessment   Pain Assessment --      Cognitive-Linquistic Treatment   Treatment focused on Cognition;Patient/family/caregiver education    Skilled Treatment Pt completed homework assignments; required usual mod cues to ID errors/omissions (~65-70% accuracy), with extended time to review and correct. Cues required for attention; impaired working memory. Pt also did not approach task/error checking systematically, and required usual cues to do so. On some occasions pt attempted to explain away deficits, however by end of activity pt verbalizing awareness of deficits with SLP cues. SLP reframed pt's "I feel stupid," to explain that pt's deficit areas (attention, awareness, working memory, processing) from fall are causing him to make errors, not "stupidity." SLP explained to pt and wife that error confrontation is to help pt recognize he needs to use a strategy and to check his work for errors. Encouraged pt and wife to note any occasions during tasks at home where attention, etc may be impacting his performance. Encouraged them to share this with SLP during sessions to help incorporate functional activities/strategies outside of therapy.  Assessment / Recommendations / Plan   Plan Continue with current plan of care      Progression Toward Goals   Progression toward goals Progressing toward goals            SLP Education - 09/10/19 0954    Education Details deficit areas    Person(s) Educated Patient;Spouse    Methods Explanation    Comprehension Verbalized understanding            SLP Short Term Goals - 09/10/19 0955      SLP SHORT TERM GOAL #1   Title pt will demo knowledge of his memory copmensation system by attempt to use with rare min cues in 2 sessions    Time 2    Period Weeks    Status On-going      SLP SHORT TERM GOAL #2   Title pt will demo correct/successful orientation to a memory compensation system in  3 sessions    Time 2    Period Weeks    Status On-going      SLP SHORT TERM GOAL #3   Title pt will tell SLP 2 cognitive deficits independently, and how they may impact him at home with rare min A in 3 sessions    Time 2    Period Weeks    Status On-going      SLP SHORT TERM GOAL #4   Title pt will undergo further cognitive linguisitic testing PRN    Time 2    Period Weeks   or 7 total sessions   Status Achieved      SLP SHORT TERM GOAL #5   Title Patient will use compensations for attention during functional task (eg conversation or reading on topic of interest) with min cues x 2 sessions.    Time 3    Period Weeks    Status On-going            SLP Long Term Goals - 09/10/19 0955      SLP LONG TERM GOAL #1   Title pt will demo correct usage of a memory compensation system for appointment and/or medication management, to-do lists, daily schedules in 3 sessions    Time 6    Period Weeks   or 17 total sessions, for all LTGs   Status On-going      SLP LONG TERM GOAL #2   Title pt will demo anticipatory awareness regarding deficit areas, independently, in 3 sessions    Time 6    Period Weeks    Status On-going      SLP LONG TERM GOAL #3   Title Pt will maintain selective attention in functional task for 8 minutes in mod noisy environment with rare min cues x 2 sessions.    Time 6    Period Weeks    Status New            Plan - 09/10/19 0954    Clinical Impression Statement Pt presents today with moderate-severe cognitive communication deficits; most significantly in attention and memory. Impulsivity, decreased awareness, and reduced frustration tolerance are also noted. Wife states pt requires more cues at home re: schedule and safety precautions, and pt looked to wife for details on history and details of other therapies (tasks, goals, deficits). Continue skilled ST to increase pt independence with schedule and daily tasks.    Speech Therapy Frequency 2x / week     Duration --   8 weeks or 17 total sessions   Treatment/Interventions  Language facilitation;Cueing hierarchy;Cognitive reorganization;Internal/external aids;Patient/family education;Compensatory strategies;SLP instruction and feedback;Functional tasks;Environmental controls    Potential to Achieve Goals Good    Consulted and Agree with Plan of Care Patient           Patient will benefit from skilled therapeutic intervention in order to improve the following deficits and impairments:   Cognitive communication deficit  Traumatic brain injury, without loss of consciousness, initial encounter [facility])    Problem List Patient Active Problem List   Diagnosis Date Noted  . Fracture of multiple ribs with pain 08/10/2019  . Clavicle fracture 08/10/2019  . Closed displaced fracture of phalanx of left thumb, sequela 08/10/2019  . TBI (traumatic brain injury) ([place]) 07/23/2019  . Traumatic closed fracture of distal clavicle with minimal displacement, left, initial encounter 07/19/2019  . Traumatic brain injury with loss of consciousness ([place])   . Multiple trauma   . Benign prostatic hyperplasia   . Essential hypertension   . AKI (acute kidney injury) ([place])   . Stage 3b chronic kidney disease   . Prediabetes   . Acute blood loss anemia   . Thrombocytopenia ([place])   . Fall 07/14/2019  . Type 2 diabetes mellitus ([place])   . Eosinophilia   . Need for prophylactic vaccination and inoculation against influenza 11/12/2013  . Nocturnal leg cramps 11/23/2012  . Pain in joint, shoulder region 11/23/2012  . Fall 11/04/2012  . Hematoma 11/04/2012  . Acute upper respiratory infections of unspecified site 11/04/2012  . HYPERLIPIDEMIA TYPE IIB / III 03/08/2008  . Orthostatic hypotension 03/08/2008  . CAD, NATIVE VESSEL 03/08/2008   Deneise Lever, [place], [place] Speech-Language Pathologist  Aliene Altes 09/10/2019, 9:56 AM  [facility] [address] [place], [place], [address] Phone: [phone]   Fax:  [phone]   Name: [name] MRN: [id number] Date of Birth: 02-23-1933

## 2019-09-10 NOTE — Therapy (Signed)
[facility] [address] [place], [place], [address] Phone: [phone]   Fax:  [phone]  Occupational Therapy Treatment  Patient Details  Name: [name] MRN: [id number] Date of Birth: Jun 20, 1933 Referring Provider (OT): Reesa Chew   Encounter Date: 09/10/2019   OT End of Session - 09/10/19 0940    Visit Number 5    Number of Visits 17    Date for OT Re-Evaluation 10/27/19    Authorization Type [employer] - Follow Medicare guidelines    Authorization - Visit Number 5    Authorization - Number of Visits 10    Progress Note Due on Visit 10    OT Start Time 0933    OT Stop Time 1013    OT Time Calculation (min) 40 min    Activity Tolerance Patient tolerated treatment well    Behavior During Therapy [facility] for tasks assessed/performed;Impulsive           Past Medical History:  Diagnosis Date  . Allergy   . BPH (benign prostatic hyperplasia)   . BPH (benign prostatic hypertrophy)   . CAD (coronary artery disease)    s/p cypher DES to pLAD 6/08; normal LVF;  ETT-Myoview 2009: no ischemia   . Coronary artery disease   . Eosinophilia   . High cholesterol   . HTN (hypertension)   . Hyperlipidemia   . Hypertension   . MI (myocardial infarction) ([place])   . Myocardial infarction ([place])   . Prediabetes   . Trigeminal neuralgia     Past Surgical History:  Procedure Laterality Date  . APPENDECTOMY    . CARDIAC CATHETERIZATION  07/30/2006   CORONARY ANGIOPLASTY WITH STENT PLACEMENT  . CARDIAC CATHETERIZATION    . EXPLORATORY LAPAROTOMY     age 86  . EXPLORATORY LAPAROTOMY      There were no vitals filed for this visit.   Subjective Assessment - 09/10/19 0935    Currently in Pain? Yes    Pain Score 3     Pain Location --   Thumb   Pain Orientation Left    Pain Descriptors / Indicators Aching    Pain Type Acute pain    Pain Onset More than a month ago    Pain Frequency Intermittent     Aggravating Factors  movement    Pain Relieving Factors rest                      Treatment: Fluidotherapy x 10 mins to left hand and wrist for pain and stiffness. No adverse reactions. Pt reports decreased pain.          OT Education - 09/10/19 1026    Education Details reviewed A/ROM and P/ROM exercises issued last visit, min-mod  v.c and demonstration, instructed pt/ wife in yellow putty exercises and simple coordination HEP, min-mod v.c-pt's wife is able to assit him    Person(s) Educated Patient;Spouse    Methods Explanation;Demonstration;Handout;Verbal cues;Tactile cues    Comprehension Verbalized understanding;Returned demonstration;Verbal cues required;Tactile cues required;Need further instruction            OT Short Term Goals - 09/03/19 1343      OT SHORT TERM GOAL #1   Title Patient will complete an HEP designed to improve left thumb range of motion with min cueing due 09/27/19    Status Achieved      OT SHORT TERM GOAL #3   Title Patient will don/doff a pull over shirt  with set up assistance    Status Achieved             OT Long Term Goals - 09/08/19 1222      OT LONG TERM GOAL #1   Title Patient will complete HEP/Home activities program to improve functional mobility and use of left hand due 9/21    Time 8    Period Weeks    Status New      OT LONG TERM GOAL #2   Title Patient will bathe and dress himself with modified independence - with increased time    Time 8    Period Weeks    Status On-going   inconsistent with donning socks     OT LONG TERM GOAL #3   Title Patient will prepare a familiar hot meal using left hand as non dominant with supervision    Time 8    Period Weeks    Status On-going   done once at home     OT [place] #4   Title Patient will demonstrate ability to manipulate small items within left hand as evidenced by his ability to complete 9 hole peg test in less than 1 minute    Time 8    Period Weeks     Status New      OT LONG TERM GOAL #5   Title Patient will understand recommendations related to driving    Time 8    Period Weeks    Status New                 Plan - 09/10/19 1028    Clinical Impression Statement Pt is progressing towards goals with improving left thumb ROM and decreased pain after fluidotherapy.    Occupational performance deficits (Please refer to evaluation for details): ADL's;IADL's;Leisure;Rest and Sleep    Body Structure / Function / Physical Skills ADL;Coordination;Endurance;GMC;UE functional use;Sensation;Decreased knowledge of precautions;Balance;Body mechanics;Decreased knowledge of use of DME;Flexibility;IADL;Pain;Strength;FMC;Dexterity;Cardiopulmonary status limiting activity;ROM;Mobility;Tone    Cognitive Skills Attention;Thought;Problem Solve;Safety Awareness;Memory;Energy/Drive    OT Frequency 2x / week    OT Duration 8 weeks    OT Treatment/Interventions Self-care/ADL training;Electrical Stimulation;Iontophoresis;Therapeutic exercise;Moist Heat;Paraffin;Neuromuscular education;Splinting;Patient/family education;Balance training;Therapeutic activities;Functional Mobility Training;Fluidtherapy;Cryotherapy;Ultrasound;DME and/or AE instruction;Manual Therapy;Passive range of motion;Cognitive remediation/compensation    Plan continue with functional use of LUE, reveiw HEP as needed, pt likes fluidotherapy prior to exercise    Consulted and Agree with Plan of Care Patient;Family member/caregiver           Patient will benefit from skilled therapeutic intervention in order to improve the following deficits and impairments:   Body Structure / Function / Physical Skills: ADL, Coordination, Endurance, GMC, UE functional use, Sensation, Decreased knowledge of precautions, Balance, Body mechanics, Decreased knowledge of use of DME, Flexibility, IADL, Pain, Strength, FMC, Dexterity, Cardiopulmonary status limiting activity, ROM, Mobility, Tone Cognitive Skills:  Attention, Thought, Problem Solve, Safety Awareness, Memory, Energy/Drive     Visit Diagnosis: Stiffness of left hand, not elsewhere classified  Muscle weakness (generalized)  Other symptoms and signs involving the nervous system  Stiffness of left wrist, not elsewhere classified  Pain in left hand    Problem List Patient Active Problem List   Diagnosis Date Noted  . Fracture of multiple ribs with pain 08/10/2019  . Clavicle fracture 08/10/2019  . Closed displaced fracture of phalanx of left thumb, sequela 08/10/2019  . TBI (traumatic brain injury) ([place]) 07/23/2019  . Traumatic closed fracture of distal clavicle with minimal displacement, left, initial encounter 07/19/2019  .  Traumatic brain injury with loss of consciousness ([place])   . Multiple trauma   . Benign prostatic hyperplasia   . Essential hypertension   . AKI (acute kidney injury) ([place])   . Stage 3b chronic kidney disease   . Prediabetes   . Acute blood loss anemia   . Thrombocytopenia ([place])   . Fall 07/14/2019  . Type 2 diabetes mellitus ([place])   . Eosinophilia   . Need for prophylactic vaccination and inoculation against influenza 11/12/2013  . Nocturnal leg cramps 11/23/2012  . Pain in joint, shoulder region 11/23/2012  . Fall 11/04/2012  . Hematoma 11/04/2012  . Acute upper respiratory infections of unspecified site 11/04/2012  . HYPERLIPIDEMIA TYPE IIB / III 03/08/2008  . Orthostatic hypotension 03/08/2008  . CAD, NATIVE VESSEL 03/08/2008    Archita Lomeli 09/10/2019, 10:33 AM Theone Murdoch, OTR/L [phone] Phone: [phone] 10:33 AM 09/10/19 [facility] [address] [place], [place], [address] Phone: [phone]   Fax:  [phone]  Name: [name] MRN: [id number] Date of Birth: 1934/01/08

## 2019-09-14 ENCOUNTER — Other Ambulatory Visit: Payer: Self-pay | Admitting: *Deleted

## 2019-09-14 ENCOUNTER — Telehealth: Payer: Self-pay | Admitting: *Deleted

## 2019-09-14 MED ORDER — LOSARTAN POTASSIUM 50 MG PO TABS
50.0000 mg | ORAL_TABLET | Freq: Every day | ORAL | 0 refills | Status: DC
Start: 2019-09-14 — End: 2019-09-30

## 2019-09-14 NOTE — Telephone Encounter (Signed)
Pt calling to report that his bp has been above 140. I asked what it was and he said around 145 and did not know what the bottom number was.   He states his asthma is worse since stopping the breo. States he had to go back on it and he has trouble breathing just in the mornings for about 45 mins when he wakes up. Tried to give an appt but he wanted note sent to dr pickard. States no trouble breathing right now.   Also having back pain from fall. And states dr pickard knows about it when I tried to get more information.   [facility]

## 2019-09-14 NOTE — Telephone Encounter (Signed)
Discussed with pt and new script for losartan sent to pharm.

## 2019-09-14 NOTE — Telephone Encounter (Signed)
I would resume breo, and start losartan 50 mg poqday for bp.  Will need new script.

## 2019-09-14 NOTE — Telephone Encounter (Signed)
Left message to return call 

## 2019-09-15 ENCOUNTER — Ambulatory Visit: Payer: PPO | Admitting: Physical Therapy

## 2019-09-15 ENCOUNTER — Encounter: Payer: Self-pay | Admitting: Physical Therapy

## 2019-09-15 ENCOUNTER — Other Ambulatory Visit: Payer: Self-pay | Admitting: Family Medicine

## 2019-09-15 ENCOUNTER — Ambulatory Visit: Payer: PPO | Admitting: Occupational Therapy

## 2019-09-15 ENCOUNTER — Other Ambulatory Visit: Payer: Self-pay

## 2019-09-15 ENCOUNTER — Ambulatory Visit: Payer: PPO | Admitting: Speech Pathology

## 2019-09-15 DIAGNOSIS — R41841 Cognitive communication deficit: Secondary | ICD-10-CM

## 2019-09-15 DIAGNOSIS — R2689 Other abnormalities of gait and mobility: Secondary | ICD-10-CM

## 2019-09-15 DIAGNOSIS — R2681 Unsteadiness on feet: Secondary | ICD-10-CM

## 2019-09-15 DIAGNOSIS — M6281 Muscle weakness (generalized): Secondary | ICD-10-CM

## 2019-09-15 DIAGNOSIS — S069X0A Unspecified intracranial injury without loss of consciousness, initial encounter: Secondary | ICD-10-CM

## 2019-09-15 DIAGNOSIS — R262 Difficulty in walking, not elsewhere classified: Secondary | ICD-10-CM

## 2019-09-15 DIAGNOSIS — R29818 Other symptoms and signs involving the nervous system: Secondary | ICD-10-CM

## 2019-09-15 DIAGNOSIS — M25642 Stiffness of left hand, not elsewhere classified: Secondary | ICD-10-CM

## 2019-09-15 NOTE — Therapy (Signed)
[facility] [address] [place], [place], [address] Phone: [phone]   Fax:  [phone]  Occupational Therapy Treatment  Patient Details  Name: [name] MRN: [id number] Date of Birth: 04/05/1933 Referring Provider (OT): Reesa Chew   Encounter Date: 09/15/2019   OT End of Session - 09/15/19 1221    Visit Number 6    Number of Visits 17    Date for OT Re-Evaluation 10/27/19    Authorization Type [employer] - Follow Medicare guidelines    Authorization - Visit Number 6    Authorization - Number of Visits 10    Progress Note Due on Visit 10    OT Start Time 0930    OT Stop Time 1015    OT Time Calculation (min) 45 min    Activity Tolerance Patient tolerated treatment well    Behavior During Therapy [facility] for tasks assessed/performed;Impulsive           Past Medical History:  Diagnosis Date  . Allergy   . BPH (benign prostatic hyperplasia)   . BPH (benign prostatic hypertrophy)   . CAD (coronary artery disease)    s/p cypher DES to pLAD 6/08; normal LVF;  ETT-Myoview 2009: no ischemia   . Coronary artery disease   . Eosinophilia   . High cholesterol   . HTN (hypertension)   . Hyperlipidemia   . Hypertension   . MI (myocardial infarction) ([place])   . Myocardial infarction ([place])   . Prediabetes   . Trigeminal neuralgia     Past Surgical History:  Procedure Laterality Date  . APPENDECTOMY    . CARDIAC CATHETERIZATION  07/30/2006   CORONARY ANGIOPLASTY WITH STENT PLACEMENT  . CARDIAC CATHETERIZATION    . EXPLORATORY LAPAROTOMY     age 28  . EXPLORATORY LAPAROTOMY      There were no vitals filed for this visit.   Subjective Assessment - 09/15/19 0938    Patient is accompanied by: Family member    Limitations NWB LUE, Thumb splint    Currently in Pain? Yes    Pain Score 5     Pain Location --   thumb   Pain Orientation Left    Pain Descriptors / Indicators Sore;Tender;Stabbing    Pain  Type Acute pain    Pain Onset More than a month ago    Pain Frequency Intermittent    Aggravating Factors  certain movements    Pain Relieving Factors rest           Fluidotherapy x 10 minutes Lt hand to decrease pain in thumb. Pt demo A/ROM ex's for thumb following fluido.  Discussed/reviewed memory strategies, and recommended a chart with functional activities to do each day (see pt instructions for ideas).  Wife also reports he still needs assist with HEP - therapist hand wrote very simple instructions next to pictures of A/ROM and strengthening HEP.                      OT Education - 09/15/19 1150    Education Details functional activities to do at home    Person(s) Educated Patient;Spouse    Methods Explanation;Handout;Verbal cues    Comprehension Verbalized understanding;Verbal cues required            OT Short Term Goals - 09/03/19 1343      OT SHORT TERM GOAL #1   Title Patient will complete an HEP designed to improve left thumb range of motion  with min cueing due 09/27/19    Status Achieved      OT SHORT TERM GOAL #3   Title Patient will don/doff a pull over shirt with set up assistance    Status Achieved             OT Long Term Goals - 09/08/19 1222      OT LONG TERM GOAL #1   Title Patient will complete HEP/Home activities program to improve functional mobility and use of left hand due 9/21    Time 8    Period Weeks    Status New      OT LONG TERM GOAL #2   Title Patient will bathe and dress himself with modified independence - with increased time    Time 8    Period Weeks    Status On-going   inconsistent with donning socks     OT LONG TERM GOAL #3   Title Patient will prepare a familiar hot meal using left hand as non dominant with supervision    Time 8    Period Weeks    Status On-going   done once at home     OT [place] #4   Title Patient will demonstrate ability to manipulate small items within left hand as  evidenced by his ability to complete 9 hole peg test in less than 1 minute    Time 8    Period Weeks    Status New      OT LONG TERM GOAL #5   Title Patient will understand recommendations related to driving    Time 8    Period Weeks    Status New                 Plan - 09/15/19 1221    Clinical Impression Statement Pt making progress towards goals. Pt limited by cognitive deficits and decreased carryover at home    Occupational performance deficits (Please refer to evaluation for details): ADL's;IADL's;Leisure;Rest and Sleep    Body Structure / Function / Physical Skills ADL;Coordination;Endurance;GMC;UE functional use;Sensation;Decreased knowledge of precautions;Balance;Body mechanics;Decreased knowledge of use of DME;Flexibility;IADL;Pain;Strength;FMC;Dexterity;Cardiopulmonary status limiting activity;ROM;Mobility;Tone    Cognitive Skills Attention;Thought;Problem Solve;Safety Awareness;Memory;Energy/Drive    OT Frequency 2x / week    OT Duration 8 weeks    OT Treatment/Interventions Self-care/ADL training;Electrical Stimulation;Iontophoresis;Therapeutic exercise;Moist Heat;Paraffin;Neuromuscular education;Splinting;Patient/family education;Balance training;Therapeutic activities;Functional Mobility Training;Fluidtherapy;Cryotherapy;Ultrasound;DME and/or AE instruction;Manual Therapy;Passive range of motion;Cognitive remediation/compensation    Plan balance, coordination with cognitive component (maybe pipe tree)    Consulted and Agree with Plan of Care Patient;Family member/caregiver           Patient will benefit from skilled therapeutic intervention in order to improve the following deficits and impairments:   Body Structure / Function / Physical Skills: ADL, Coordination, Endurance, GMC, UE functional use, Sensation, Decreased knowledge of precautions, Balance, Body mechanics, Decreased knowledge of use of DME, Flexibility, IADL, Pain, Strength, FMC, Dexterity,  Cardiopulmonary status limiting activity, ROM, Mobility, Tone Cognitive Skills: Attention, Thought, Problem Solve, Safety Awareness, Memory, Energy/Drive     Visit Diagnosis: Stiffness of left hand, not elsewhere classified  Muscle weakness (generalized)    Problem List Patient Active Problem List   Diagnosis Date Noted  . Fracture of multiple ribs with pain 08/10/2019  . Clavicle fracture 08/10/2019  . Closed displaced fracture of phalanx of left thumb, sequela 08/10/2019  . TBI (traumatic brain injury) ([place]) 07/23/2019  . Traumatic closed fracture of distal clavicle with minimal displacement, left, initial encounter 07/19/2019  .  Traumatic brain injury with loss of consciousness ([place])   . Multiple trauma   . Benign prostatic hyperplasia   . Essential hypertension   . AKI (acute kidney injury) ([place])   . Stage 3b chronic kidney disease   . Prediabetes   . Acute blood loss anemia   . Thrombocytopenia ([place])   . Fall 07/14/2019  . Type 2 diabetes mellitus ([place])   . Eosinophilia   . Need for prophylactic vaccination and inoculation against influenza 11/12/2013  . Nocturnal leg cramps 11/23/2012  . Pain in joint, shoulder region 11/23/2012  . Fall 11/04/2012  . Hematoma 11/04/2012  . Acute upper respiratory infections of unspecified site 11/04/2012  . HYPERLIPIDEMIA TYPE IIB / III 03/08/2008  . Orthostatic hypotension 03/08/2008  . CAD, NATIVE VESSEL 03/08/2008    Carey Bullocks, OTR/L 09/15/2019, 12:23 PM  [facility] [address] [place], [place], [address] Phone: [phone]   Fax:  [phone]  Name: [name] MRN: [id number] Date of Birth: July 17, 1933

## 2019-09-15 NOTE — Patient Instructions (Signed)
   Practice:   Folding laundry in standing (with chair behind)  Making sandwich/snacks Cooking familiar meals with supervision Clean bathroom counters or dusting Assemble simple project/legos Feed the birds Read Bible and create lesson Crossword puzzles

## 2019-09-15 NOTE — Therapy (Signed)
[facility] [address] [place], [place], [address] Phone: [phone]   Fax:  [phone]  Physical Therapy Treatment  Patient Details  Name: [name] MRN: [id number] Date of Birth: 23-Oct-1933 Referring Provider (PT): Reesa Chew PA-C   Encounter Date: 09/15/2019   PT End of Session - 09/15/19 1106    Visit Number 7    Number of Visits 17    Date for PT Re-Evaluation 11/12/19   written for 60 day POC   Authorization Type [employer] P[employer] - $15 co pay, collect 1 co pay per day for multiple disciplines    PT Start Time 1103    PT Stop Time 1145    PT Time Calculation (min) 42 min    Equipment Utilized During Treatment Gait belt    Activity Tolerance Patient tolerated treatment well;Patient limited by fatigue    Behavior During Therapy [facility] for tasks assessed/performed;Impulsive           Past Medical History:  Diagnosis Date  . Allergy   . BPH (benign prostatic hyperplasia)   . BPH (benign prostatic hypertrophy)   . CAD (coronary artery disease)    s/p cypher DES to pLAD 6/08; normal LVF;  ETT-Myoview 2009: no ischemia   . Coronary artery disease   . Eosinophilia   . High cholesterol   . HTN (hypertension)   . Hyperlipidemia   . Hypertension   . MI (myocardial infarction) ([place])   . Myocardial infarction ([place])   . Prediabetes   . Trigeminal neuralgia     Past Surgical History:  Procedure Laterality Date  . APPENDECTOMY    . CARDIAC CATHETERIZATION  07/30/2006   CORONARY ANGIOPLASTY WITH STENT PLACEMENT  . CARDIAC CATHETERIZATION    . EXPLORATORY LAPAROTOMY     age 80  . EXPLORATORY LAPAROTOMY      There were no vitals filed for this visit.   Subjective Assessment - 09/15/19 1104    Subjective No new complaints. No falls. Still with left thumb pain when he uses it, otherwise no pain.    Patient is accompained by: Family member   spouse   Pertinent History PMH: BPH, CAD, HTN,  CKD III, prediabetes, mild cognitive impairment.    Patient Stated Goals wants to walk without worrying about potentially having a dizzy spell - reports 4-5 spells a day that lasts only a couple of seconds.    Currently in Pain? No/denies    Pain Score 0-No pain   0/10 at rest, 7-0/62 with certain movements   Pain Location Finger (Comment which one)   thumb   Pain Orientation Left    Pain Descriptors / Indicators Sore;Tender    Pain Type Acute pain    Pain Onset More than a month ago    Pain Frequency Intermittent    Aggravating Factors  certain movements    Pain Relieving Factors rest                     [facility] Adult PT Treatment/Exercise - 09/15/19 1106      Transfers   Transfers Sit to Stand;Stand to Sit    Sit to Stand 5: Supervision;With upper extremity assist;From bed;From chair/3-in-1    Stand to Sit 5: Supervision;With upper extremity assist;To bed;To chair/3-in-1      Ambulation/Gait   Ambulation/Gait Yes    Ambulation/Gait Assistance 5: Supervision;4: Min guard    Ambulation/Gait Assistance Details gait around track with walking stick working on speed  changes, scanning all directions randomly, and sudden stops/starts with up to min guard assist. no significant balance loss noted with use of walking stick. remainder of session pt did not use AD with gait with min guard assist and cues to slow down at times for safety.     Ambulation Distance (Feet) 375 Feet   x1 with stick; around gym no device   Assistive device Other (Comment);None    Gait Pattern Step-through pattern;Decreased stride length;Decreased arm swing - left    Ambulation Surface Level;Indoor      High Level Balance   High Level Balance Activities Side stepping;Marching forwards;Marching backwards;Tandem walking   tandem/toe walk fwd/bwd   High Level Balance Comments on red/blue mats next to counter top: 3 laps each with cues on correct ex form/technique and weight shifitng. min guard to min assist, cues  to slow down needed at times.                Balance Exercises - 09/15/19 1137      Balance Exercises: Standing   Rockerboard Anterior/posterior;Lateral;EO;EC;30 seconds;Limitations;Intermittent UE support    Balance Beam standing across red foam beam with HHA on right/intermittent touch to chair back on left- alternating fwd stepping to floor/backn onto beam, then alternating bwd stepping to floor/back onto beam for ~10 reps each, min assist for balance with cues for increased step length/height with stepping.     Sit to Stand Limitations seated with feet across red foam beam: sit<>stands for 2 sets of 10 reps with light UE support. cues for tall standing and controlled descent. min guard to min assist for balance.                PT Short Term Goals - 09/10/19 1020      PT SHORT TERM GOAL #1   Title Pt will be independent with initial HEP in order to build upon functional gains made in therapy. 09/14/19    Baseline 09/10/19: met with current program which spouse/pt report are still challenging.    Status Achieved    Target Date 09/14/19      PT SHORT TERM GOAL #2   Title Pt will improve DGI score to at least a 14/24 in order to demo decr fall risk.    Baseline 09/10/19: 18/24 scored today    Status Achieved      PT SHORT TERM GOAL #3   Title Pt and pt's spouse will verbalize understanding of fall prevention in the home.    Baseline 09/10/19: met in session today    Time --    Period --    Status Achieved      PT SHORT TERM GOAL #4   Title Stairs to be assessed as appropriate - LTG to be written as appropriate.    Baseline 09/03/19: min guard assist with single rail sideways for 4 stairs x 4 reps in session today, step to pattern.    Status Achieved      PT SHORT TERM GOAL #5   Title Pt will perform 5x sit <> stand with no BLE bracing against mat table/chair and improved eccentric control in order  to demo improved safety with transfers.    Baseline 09/10/19: met in session  today with time of 9.32 sec's with no UE assist, no retropulsion    Time --    Period --    Status Achieved             PT Long Term Goals - 09/07/19 6599  PT LONG TERM GOAL #1   Title Pt will be independent with final HEP in order to build upon functional gains made in therapy. ALL LTGS DUE 10/12/19    Time 8    Period Weeks    Status New      PT LONG TERM GOAL #2   Title Pt will perform at least 12 steps with step to pattern vs. step through pattern with single handrail with supervision in order to safely enter/exit home and go to 2nd floor.    Time 8    Period Weeks    Status New      PT LONG TERM GOAL #3   Title Pt will improve DGI score to at least a 17/24 in order to demo decr fall risk.    Baseline 11/24    Time 8    Period Weeks    Status Revised      PT LONG TERM GOAL #4   Title Pt will perform TUG with no AD vs. LRAD in 13.5 seconds or less in order to demo decr fall risk.    Time 8    Period Weeks    Status New      PT LONG TERM GOAL #5   Title Pt will ambulate at least 500' with supervision over level indoor and unlevel outdoor surfaces in order to demo improved community mobility.    Time 8    Period Weeks    Status New                 Plan - 09/15/19 1106    Clinical Impression Statement Today's skilled session continued to focus on dynamic gait and balance reactions on compliant surfaces. Very few rest breaks needed today. The pt does need cues to slow down with mobility/gait at times for safety. The pt is progressing toward goals and should benefit from continued PT to progress toward unmet goals.    Personal Factors and Comorbidities Comorbidity 3+;Past/Current Experience;Behavior Pattern    Comorbidities BPH, CAD, HTN, CKD III, prediabetes, mild cognitive impairment,    Examination-Activity Limitations Transfers;Stairs;Locomotion Level    Examination-Participation Restrictions Community Activity    Stability/Clinical Decision Making  Evolving/Moderate complexity    Rehab Potential Good    PT Frequency 2x / week    PT Duration 8 weeks    PT Treatment/Interventions ADLs/Self Care Home Management;DME Instruction;Stair training;Therapeutic activities;Functional mobility training;Gait training;Therapeutic exercise;Balance training;Neuromuscular re-education;Patient/family education;Vestibular    PT Next Visit Plan upgrade HEP as appropriate. continue gait training with LRAD/walking stick, balance and strengthening ex's.    PT Home Exercise Plan Access Code: [email or web address]    Consulted and Agree with Plan of Care Patient;Family member/caregiver    Family Member Consulted pt's spouse           Patient will benefit from skilled therapeutic intervention in order to improve the following deficits and impairments:  Abnormal gait, Decreased balance, Decreased activity tolerance, Decreased coordination, Decreased knowledge of use of DME, Decreased safety awareness, Decreased strength, Difficulty walking, Dizziness, Impaired UE functional use  Visit Diagnosis: Muscle weakness (generalized)  Other symptoms and signs involving the nervous system  Unsteadiness on feet  Difficulty in walking, not elsewhere classified  Other abnormalities of gait and mobility     Problem List Patient Active Problem List   Diagnosis Date Noted  . Fracture of multiple ribs with pain 08/10/2019  . Clavicle fracture 08/10/2019  . Closed displaced fracture of phalanx of left thumb, sequela 08/10/2019  . TBI (traumatic  brain injury) ([place]) 07/23/2019  . Traumatic closed fracture of distal clavicle with minimal displacement, left, initial encounter 07/19/2019  . Traumatic brain injury with loss of consciousness ([place])   . Multiple trauma   . Benign prostatic hyperplasia   . Essential hypertension   . AKI (acute kidney injury) ([place])   . Stage 3b chronic kidney disease   . Prediabetes   . Acute blood loss anemia   . Thrombocytopenia ([place])   . Fall  07/14/2019  . Type 2 diabetes mellitus ([place])   . Eosinophilia   . Need for prophylactic vaccination and inoculation against influenza 11/12/2013  . Nocturnal leg cramps 11/23/2012  . Pain in joint, shoulder region 11/23/2012  . Fall 11/04/2012  . Hematoma 11/04/2012  . Acute upper respiratory infections of unspecified site 11/04/2012  . HYPERLIPIDEMIA TYPE IIB / III 03/08/2008  . Orthostatic hypotension 03/08/2008  . CAD, NATIVE VESSEL 03/08/2008    Willow Ora, PTA, [facility] [address] [place], [place] [address] [phone] 09/15/19, 4:21 PM   Name: [name] MRN: [id number] Date of Birth: [birth year]

## 2019-09-15 NOTE — Therapy (Signed)
[facility] [address] [place], [place], [address] Phone: [phone]   Fax:  [phone]  Speech Language Pathology Treatment  Patient Details  Name: [name] MRN: [id number] Date of Birth: 1933-02-24 Referring Provider (SLP): Reesa Chew, PA-C   Encounter Date: 09/15/2019   End of Session - 09/15/19 1309    Visit Number 6    Number of Visits 17    Date for SLP Re-Evaluation 11/16/19    SLP Start Time 2706    SLP Stop Time  1145    SLP Time Calculation (min) 43 min    Activity Tolerance Patient tolerated treatment well           Past Medical History:  Diagnosis Date  . Allergy   . BPH (benign prostatic hyperplasia)   . BPH (benign prostatic hypertrophy)   . CAD (coronary artery disease)    s/p cypher DES to pLAD 6/08; normal LVF;  ETT-Myoview 2009: no ischemia   . Coronary artery disease   . Eosinophilia   . High cholesterol   . HTN (hypertension)   . Hyperlipidemia   . Hypertension   . MI (myocardial infarction) ([place])   . Myocardial infarction ([place])   . Prediabetes   . Trigeminal neuralgia     Past Surgical History:  Procedure Laterality Date  . APPENDECTOMY    . CARDIAC CATHETERIZATION  07/30/2006   CORONARY ANGIOPLASTY WITH STENT PLACEMENT  . CARDIAC CATHETERIZATION    . EXPLORATORY LAPAROTOMY     age 86  . EXPLORATORY LAPAROTOMY      There were no vitals filed for this visit.          ADULT SLP TREATMENT - 09/15/19 1309      General Information   Behavior/Cognition Alert;Cooperative;Pleasant mood      Treatment Provided   Treatment provided Cognitive-Linquistic      Pain Assessment   Pain Assessment No/denies pain      Cognitive-Linquistic Treatment   Treatment focused on Cognition;Patient/family/caregiver education    Skilled Treatment Patient required usual mod cues to locate and distinguish items in his planner (ST homework, PT/OT exercises, calendar). SLP  targeted pt's attention and organization skills by having his organize his notebook. Pt required usual mod cues to file papers appropriately. Reviewed pt's homework (attention to detail) which was 70% correct; pt required frequent mod cues to ID errors. Afterwards SLP asked pt how he could have prevented making these mistakes and pt verbalized that he should read more carefully and return to check work after doing something else. SLP reinforced pt should double check all work.       Assessment / Recommendations / Plan   Plan Continue with current plan of care      Progression Toward Goals   Progression toward goals Progressing toward goals              SLP Short Term Goals - 09/15/19 1310      SLP SHORT TERM GOAL #1   Title pt will demo knowledge of his memory copmensation system by attempt to use with rare min cues in 2 sessions    Time 1    Period Weeks    Status On-going      SLP SHORT TERM GOAL #2   Title pt will demo correct/successful orientation to a memory compensation system in 3 sessions    Time 1    Period Weeks    Status On-going      SLP  SHORT TERM GOAL #3   Title pt will tell SLP 2 cognitive deficits independently, and how they may impact him at home with rare min A in 3 sessions    Time 1    Period Weeks    Status On-going      SLP SHORT TERM GOAL #4   Title pt will undergo further cognitive linguisitic testing PRN    Time 2    Period Weeks   or 7 total sessions   Status Achieved      SLP SHORT TERM GOAL #5   Title Patient will use compensations for attention during functional task (eg conversation or reading on topic of interest) with min cues x 2 sessions.    Time 2    Period Weeks    Status On-going            SLP Long Term Goals - 09/15/19 1310      SLP LONG TERM GOAL #1   Title pt will demo correct usage of a memory compensation system for appointment and/or medication management, to-do lists, daily schedules in 3 sessions    Time 5    Period  Weeks   or 17 total sessions, for all LTGs   Status On-going      SLP LONG TERM GOAL #2   Title pt will demo anticipatory awareness regarding deficit areas, independently, in 3 sessions    Time 5    Period Weeks    Status On-going      SLP LONG TERM GOAL #3   Title Pt will maintain selective attention in functional task for 8 minutes in mod noisy environment with rare min cues x 2 sessions.    Time 5    Period Weeks    Status New            Plan - 09/15/19 1309    Clinical Impression Statement Pt presents today with moderate-severe cognitive communication deficits; most significantly in attention and memory. Impulsivity, decreased awareness, and reduced frustration tolerance are also noted. Wife states pt requires more cues at home re: schedule and safety precautions, and pt looked to wife for details on history and details of other therapies (tasks, goals, deficits). Continue skilled ST to increase pt independence with schedule and daily tasks.    Speech Therapy Frequency 2x / week    Duration --   8 weeks or 17 total sessions   Treatment/Interventions Language facilitation;Cueing hierarchy;Cognitive reorganization;Internal/external aids;Patient/family education;Compensatory strategies;SLP instruction and feedback;Functional tasks;Environmental controls    Potential to Achieve Goals Good    Consulted and Agree with Plan of Care Patient           Patient will benefit from skilled therapeutic intervention in order to improve the following deficits and impairments:   Cognitive communication deficit  Traumatic brain injury, without loss of consciousness, initial encounter [facility])    Problem List Patient Active Problem List   Diagnosis Date Noted  . Fracture of multiple ribs with pain 08/10/2019  . Clavicle fracture 08/10/2019  . Closed displaced fracture of phalanx of left thumb, sequela 08/10/2019  . TBI (traumatic brain injury) ([place]) 07/23/2019  . Traumatic closed fracture  of distal clavicle with minimal displacement, left, initial encounter 07/19/2019  . Traumatic brain injury with loss of consciousness ([place])   . Multiple trauma   . Benign prostatic hyperplasia   . Essential hypertension   . AKI (acute kidney injury) ([place])   . Stage 3b chronic kidney disease   . Prediabetes   .  Acute blood loss anemia   . Thrombocytopenia ([place])   . Fall 07/14/2019  . Type 2 diabetes mellitus ([place])   . Eosinophilia   . Need for prophylactic vaccination and inoculation against influenza 11/12/2013  . Nocturnal leg cramps 11/23/2012  . Pain in joint, shoulder region 11/23/2012  . Fall 11/04/2012  . Hematoma 11/04/2012  . Acute upper respiratory infections of unspecified site 11/04/2012  . HYPERLIPIDEMIA TYPE IIB / III 03/08/2008  . Orthostatic hypotension 03/08/2008  . CAD, NATIVE VESSEL 03/08/2008   Deneise Lever, [place], [place] Speech-Language Pathologist  Aliene Altes 09/15/2019, 1:11 PM  [facility] [address] [place], [place], [address] Phone: [phone]   Fax:  [phone]   Name: DANNA CASELLA MRN: [id number] Date of Birth: 05-25-1933

## 2019-09-17 ENCOUNTER — Ambulatory Visit: Payer: PPO | Admitting: Physical Therapy

## 2019-09-17 ENCOUNTER — Ambulatory Visit: Payer: PPO | Admitting: Occupational Therapy

## 2019-09-17 ENCOUNTER — Ambulatory Visit: Payer: PPO

## 2019-09-17 ENCOUNTER — Other Ambulatory Visit: Payer: Self-pay

## 2019-09-17 DIAGNOSIS — M6281 Muscle weakness (generalized): Secondary | ICD-10-CM

## 2019-09-17 DIAGNOSIS — M79642 Pain in left hand: Secondary | ICD-10-CM

## 2019-09-17 DIAGNOSIS — R41841 Cognitive communication deficit: Secondary | ICD-10-CM

## 2019-09-17 DIAGNOSIS — R262 Difficulty in walking, not elsewhere classified: Secondary | ICD-10-CM

## 2019-09-17 DIAGNOSIS — M25632 Stiffness of left wrist, not elsewhere classified: Secondary | ICD-10-CM

## 2019-09-17 DIAGNOSIS — M25642 Stiffness of left hand, not elsewhere classified: Secondary | ICD-10-CM

## 2019-09-17 DIAGNOSIS — R29818 Other symptoms and signs involving the nervous system: Secondary | ICD-10-CM

## 2019-09-17 DIAGNOSIS — R2681 Unsteadiness on feet: Secondary | ICD-10-CM

## 2019-09-17 DIAGNOSIS — R41840 Attention and concentration deficit: Secondary | ICD-10-CM

## 2019-09-17 NOTE — Therapy (Signed)
[facility] [address] [place], [place], [address] Phone: [phone]   Fax:  [phone]  Occupational Therapy Treatment  Patient Details  Name: [name] MRN: [id number] Date of Birth: [birth year] Referring Provider (OT): Reesa Chew   Encounter Date: 09/17/2019   OT End of Session - 09/17/19 0947    Visit Number 7    Number of Visits 17    Date for OT Re-Evaluation 10/27/19    Authorization Type [employer] - Follow Medicare guidelines    Authorization - Visit Number 7    Authorization - Number of Visits 10    Progress Note Due on Visit 10    OT Start Time [phone]    OT Stop Time 1015    OT Time Calculation (min) 38 min    Activity Tolerance Patient tolerated treatment well    Behavior During Therapy [facility] for tasks assessed/performed;Impulsive           Past Medical History:  Diagnosis Date  . Allergy   . BPH (benign prostatic hyperplasia)   . BPH (benign prostatic hypertrophy)   . CAD (coronary artery disease)    s/p cypher DES to pLAD 6/08; normal LVF;  ETT-Myoview 2009: no ischemia   . Coronary artery disease   . Eosinophilia   . High cholesterol   . HTN (hypertension)   . Hyperlipidemia   . Hypertension   . MI (myocardial infarction) ([place])   . Myocardial infarction ([place])   . Prediabetes   . Trigeminal neuralgia     Past Surgical History:  Procedure Laterality Date  . APPENDECTOMY    . CARDIAC CATHETERIZATION  07/30/2006   CORONARY ANGIOPLASTY WITH STENT PLACEMENT  . CARDIAC CATHETERIZATION    . EXPLORATORY LAPAROTOMY     age 64  . EXPLORATORY LAPAROTOMY      There were no vitals filed for this visit.   Subjective Assessment - 09/17/19 0946    Currently in Pain? Yes    Pain Score 2     Pain Location Hand    Pain Orientation Left    Pain Descriptors / Indicators Aching    Pain Type Acute pain    Pain Onset More than a month ago    Pain Frequency Intermittent     Aggravating Factors  movement    Pain Relieving Factors heat/ fluidotherapy                 Treatment: Fluidotherapy x 10 mins to left hand for pain and stiffness, no adverse reactions. Pipe tree design x 2 for organization, attention and functional use of LUE, mod v.c for performance and for organizing pieces. Graded clothespins for sustained pinch, with LUE yellow, red and green, the blue and black were too resistive and uncomfortable.                  OT Short Term Goals - 09/03/19 1343      OT SHORT TERM GOAL #1   Title Patient will complete an HEP designed to improve left thumb range of motion with min cueing due 09/27/19    Status Achieved      OT SHORT TERM GOAL #3   Title Patient will don/doff a pull over shirt with set up assistance    Status Achieved             OT Long Term Goals - 09/08/19 1222      OT LONG TERM GOAL #1   Title Patient  will complete HEP/Home activities program to improve functional mobility and use of left hand due 9/21    Time 8    Period Weeks    Status New      OT LONG TERM GOAL #2   Title Patient will bathe and dress himself with modified independence - with increased time    Time 8    Period Weeks    Status On-going   inconsistent with donning socks     OT LONG TERM GOAL #3   Title Patient will prepare a familiar hot meal using left hand as non dominant with supervision    Time 8    Period Weeks    Status On-going   done once at home     OT [place] #4   Title Patient will demonstrate ability to manipulate small items within left hand as evidenced by his ability to complete 9 hole peg test in less than 1 minute    Time 8    Period Weeks    Status New      OT LONG TERM GOAL #5   Title Patient will understand recommendations related to driving    Time 8    Period Weeks    Status New                  Patient will benefit from skilled therapeutic intervention in order to improve the following  deficits and impairments:           Visit Diagnosis: Stiffness of left hand, not elsewhere classified  Muscle weakness (generalized)  Other symptoms and signs involving the nervous system  Stiffness of left wrist, not elsewhere classified  Pain in left hand  Attention and concentration deficit    Problem List Patient Active Problem List   Diagnosis Date Noted  . Fracture of multiple ribs with pain 08/10/2019  . Clavicle fracture 08/10/2019  . Closed displaced fracture of phalanx of left thumb, sequela 08/10/2019  . TBI (traumatic brain injury) ([place]) 07/23/2019  . Traumatic closed fracture of distal clavicle with minimal displacement, left, initial encounter 07/19/2019  . Traumatic brain injury with loss of consciousness ([place])   . Multiple trauma   . Benign prostatic hyperplasia   . Essential hypertension   . AKI (acute kidney injury) ([place])   . Stage 3b chronic kidney disease   . Prediabetes   . Acute blood loss anemia   . Thrombocytopenia ([place])   . Fall 07/14/2019  . Type 2 diabetes mellitus ([place])   . Eosinophilia   . Need for prophylactic vaccination and inoculation against influenza 11/12/2013  . Nocturnal leg cramps 11/23/2012  . Pain in joint, shoulder region 11/23/2012  . Fall 11/04/2012  . Hematoma 11/04/2012  . Acute upper respiratory infections of unspecified site 11/04/2012  . HYPERLIPIDEMIA TYPE IIB / III 03/08/2008  . Orthostatic hypotension 03/08/2008  . CAD, NATIVE VESSEL 03/08/2008    Kitti Mcclish 09/17/2019, 10:26 AM  [facility] [address] [place], [place], [address] Phone: [phone]   Fax:  [phone]  Name: [name] MRN: [id number] Date of Birth: 1933-05-01

## 2019-09-17 NOTE — Therapy (Signed)
[facility] [address] [place], [place], [address] Phone: [phone]   Fax:  [phone]  Speech Language Pathology Treatment  Patient Details  Name: [name] MRN: [id number] Date of Birth: 08/20/1933 Referring Provider (SLP): Reesa Chew, [place]   Encounter Date: 09/17/2019   End of Session - 09/17/19 1427    Visit Number 7    Number of Visits 17    Date for SLP Re-Evaluation 11/16/19    SLP Start Time 0849    SLP Stop Time  0930    SLP Time Calculation (min) 41 min    Activity Tolerance Patient tolerated treatment well           Past Medical History:  Diagnosis Date  . Allergy   . BPH (benign prostatic hyperplasia)   . BPH (benign prostatic hypertrophy)   . CAD (coronary artery disease)    s/p cypher DES to pLAD 6/08; normal LVF;  ETT-Myoview 2009: no ischemia   . Coronary artery disease   . Eosinophilia   . High cholesterol   . HTN (hypertension)   . Hyperlipidemia   . Hypertension   . MI (myocardial infarction) ([place])   . Myocardial infarction ([place])   . Prediabetes   . Trigeminal neuralgia     Past Surgical History:  Procedure Laterality Date  . APPENDECTOMY    . CARDIAC CATHETERIZATION  07/30/2006   CORONARY ANGIOPLASTY WITH STENT PLACEMENT  . CARDIAC CATHETERIZATION    . EXPLORATORY LAPAROTOMY     age 64  . EXPLORATORY LAPAROTOMY      There were no vitals filed for this visit.   Subjective Assessment - 09/17/19 0852    Subjective "Straight on, then?" (towards gym instead of ST room)    Patient is accompained by: Family member   wife   Currently in Pain? Yes    Pain Score 4     Pain Location --   thumb   Pain Orientation Left    Pain Descriptors / Indicators Tender;Sore    Pain Onset More than a month ago    Pain Frequency Intermittent                 ADULT SLP TREATMENT - 09/17/19 0855      General Information   Behavior/Cognition Alert;Cooperative;Pleasant  mood      Treatment Provided   Treatment provided Cognitive-Linquistic      Cognitive-Linquistic Treatment   Treatment focused on Cognition;Patient/family/caregiver education    Skilled Treatment Pt arrived with homework complete, without SLP prompting to remove from notebook. Reviewed pt's homework with wife present. Pt's organization was deficient, and req'd mod-max A from SLP to correctly categorize each area. With memory book, pt req'd mod-max cues to address correct section in notebook - e.g., looked in "ST" section for schedule, put completed homework in the pocket in front -reserved (with note as cue) for to-do homework. Wife with appropairate cues but jumped in too quickly to provide pt with correct answer after cue.      Assessment / Recommendations / Plan   Plan Continue with current plan of care      Progression Toward Goals   Progression toward goals Progressing toward goals              SLP Short Term Goals - 09/17/19 1429      SLP SHORT TERM GOAL #1   Title pt will demo knowledge of his memory copmensation system by attempt to use with rare  min cues in 2 sessions    Baseline 09-16-19    Status Partially Met      SLP SHORT TERM GOAL #2   Title pt will demo correct/successful orientation to a memory compensation system in 3 sessions    Status Not Met      SLP SHORT TERM GOAL #3   Title pt will tell SLP 2 cognitive deficits independently, and how they may impact him at home with rare min A in 3 sessions    Status Not Met      SLP SHORT TERM GOAL #4   Title pt will undergo further cognitive linguisitic testing PRN    Period --   or 7 total sessions   Status Achieved      SLP SHORT TERM GOAL #5   Title Patient will use compensations for attention during functional task (eg conversation or reading on topic of interest) with min cues x 2 sessions.    Status Not Met            SLP Long Term Goals - 09/17/19 1430      SLP LONG TERM GOAL #1   Title pt will demo  correct usage of a memory compensation system for appointment and/or medication management, to-do lists, daily schedules in 3 sessions    Time 5    Period Weeks   or 17 total sessions, for all LTGs   Status On-going      SLP LONG TERM GOAL #2   Title pt will demo anticipatory awareness regarding deficit areas, independently, in 3 sessions    Time 5    Period Weeks    Status On-going      SLP LONG TERM GOAL #3   Title Pt will maintain selective attention in functional task for 8 minutes in mod noisy environment with rare min cues x 2 sessions.    Time 5    Period Weeks    Status New            Plan - 09/17/19 1427    Clinical Impression Statement Pt presents today with moderate-severe cognitive communication deficits; most significantly in attention and memory. Impulsivity, decreased awareness, and reduced frustration tolerance are also noted. Pt did not meet most of his STGs -LTGs to be modified PRN. Pt req'd consistent mod-max cues for where to look in memory book sections today -? pt use at home for something other than a place to get and place homework. Continue skilled ST to increase pt independence with schedule and daily tasks.    Speech Therapy Frequency 2x / week    Duration --   8 weeks or 17 total sessions   Treatment/Interventions Language facilitation;Cueing hierarchy;Cognitive reorganization;Internal/external aids;Patient/family education;Compensatory strategies;SLP instruction and feedback;Functional tasks;Environmental controls    Potential to Achieve Goals Good    Potential Considerations Severity of impairments;Ability to learn/carryover information    Consulted and Agree with Plan of Care Patient           Patient will benefit from skilled therapeutic intervention in order to improve the following deficits and impairments:   Cognitive communication deficit    Problem List Patient Active Problem List   Diagnosis Date Noted  . Fracture of multiple ribs with  pain 08/10/2019  . Clavicle fracture 08/10/2019  . Closed displaced fracture of phalanx of left thumb, sequela 08/10/2019  . TBI (traumatic brain injury) ([place]) 07/23/2019  . Traumatic closed fracture of distal clavicle with minimal displacement, left, initial encounter 07/19/2019  . Traumatic  brain injury with loss of consciousness ([place])   . Multiple trauma   . Benign prostatic hyperplasia   . Essential hypertension   . AKI (acute kidney injury) ([place])   . Stage 3b chronic kidney disease   . Prediabetes   . Acute blood loss anemia   . Thrombocytopenia ([place])   . Fall 07/14/2019  . Type 2 diabetes mellitus ([place])   . Eosinophilia   . Need for prophylactic vaccination and inoculation against influenza 11/12/2013  . Nocturnal leg cramps 11/23/2012  . Pain in joint, shoulder region 11/23/2012  . Fall 11/04/2012  . Hematoma 11/04/2012  . Acute upper respiratory infections of unspecified site 11/04/2012  . HYPERLIPIDEMIA TYPE IIB / III 03/08/2008  . Orthostatic hypotension 03/08/2008  . CAD, NATIVE VESSEL 03/08/2008    [facility] ,MS, [place]  09/17/2019, 2:32 PM  [facility] [address] [place], [place], [address] Phone: [phone]   Fax:  [phone]   Name: [name] MRN: [id number] Date of Birth: 1933-06-03

## 2019-09-17 NOTE — Therapy (Signed)
[facility] [address] [place], [place], [address] Phone: [phone]   Fax:  [phone]  Physical Therapy Treatment  Patient Details  Name: [name] MRN: [id number] Date of Birth: 1933/10/28 Referring Provider (PT): Reesa Chew PA-C   Encounter Date: 09/17/2019   PT End of Session - 09/17/19 1058    Visit Number 8    Number of Visits 17    Date for PT Re-Evaluation 11/12/19   written for 60 day POC   Authorization Type [employer] P[employer] - $15 co pay, collect 1 co pay per day for multiple disciplines    PT Start Time 1017    PT Stop Time 1057    PT Time Calculation (min) 40 min    Equipment Utilized During Treatment Gait belt    Activity Tolerance Patient tolerated treatment well;Patient limited by fatigue    Behavior During Therapy [facility] for tasks assessed/performed;Impulsive           Past Medical History:  Diagnosis Date  . Allergy   . BPH (benign prostatic hyperplasia)   . BPH (benign prostatic hypertrophy)   . CAD (coronary artery disease)    s/p cypher DES to pLAD 6/08; normal LVF;  ETT-Myoview 2009: no ischemia   . Coronary artery disease   . Eosinophilia   . High cholesterol   . HTN (hypertension)   . Hyperlipidemia   . Hypertension   . MI (myocardial infarction) ([place])   . Myocardial infarction ([place])   . Prediabetes   . Trigeminal neuralgia     Past Surgical History:  Procedure Laterality Date  . APPENDECTOMY    . CARDIAC CATHETERIZATION  07/30/2006   CORONARY ANGIOPLASTY WITH STENT PLACEMENT  . CARDIAC CATHETERIZATION    . EXPLORATORY LAPAROTOMY     age 66  . EXPLORATORY LAPAROTOMY      There were no vitals filed for this visit.   Subjective Assessment - 09/17/19 1019    Subjective No falls. Wife reports that sometimes he leaves his cane in the other room.    Patient is accompained by: Family member   spouse   Pertinent History PMH: BPH, CAD, HTN, CKD III,  prediabetes, mild cognitive impairment.    Patient Stated Goals wants to walk without worrying about potentially having a dizzy spell - reports 4-5 spells a day that lasts only a couple of seconds.    Currently in Pain? Yes    Pain Score 3     Pain Location Hand   thumb   Pain Orientation Left    Pain Descriptors / Indicators Aching    Pain Type Acute pain    Pain Onset More than a month ago    Aggravating Factors  certin movements    Pain Relieving Factors heat fluidotherapy                             [facility] Adult PT Treatment/Exercise - 09/17/19 1031      Transfers   Transfers Sit to Stand;Stand to Sit    Sit to Stand 5: Supervision;4: Min guard    Comments on blue foam beam, with no UE support and intermittent UE support coming to stand, tall posture in standing and then sitting back down with eccentric control, min guard and occassional min A for balance x12 reps               Balance Exercises - 09/17/19 0001  Balance Exercises: Standing   Standing Eyes Opened Narrow base of support (BOS);Foam/compliant surface;Head turns    Standing Eyes Opened Limitations 2 x 10 reps head turns, needing min A intermittently for balance    Standing Eyes Closed Foam/compliant surface;3 reps;30 secs    Standing Eyes Closed Limitations feet hip width distance, first rep 15 seconds, 2nd 2 reps 30 seconds    SLS Eyes open    SLS Limitations alternating soccer ball taps - beginning on blue foam beam with single UE support x10 reps and then performing on level ground x15 reps beginning with single UE support and then progressing to none with min guard/min A for balance - cues for slowed and controlled while performing    SLS with Vectors Solid surface;Upper extremity assist 1    SLS with Vectors Limitations alternating stepping over smaller orange hurdle with RUE support on countertop x10 reps alternating, cues for foot clearance and heel strike and weight shift when  stepping forward     Step Over Hurdles / Cones next to countertop with single UE support: stepping over three 2-3" obstacles, working on reciprocal pattern, foot clearance, step length, and slowed pace for SLS, min guard down and back x7 reps, pt improving with each rep    Other Standing Exercises Standing with single UE support on chair: PT tech rolling soccer ball to pt for pt to kick it with RLE, multiple reps and progressing to not using UE support for balance, min guard               PT Short Term Goals - 09/10/19 1020      PT SHORT TERM GOAL #1   Title Pt will be independent with initial HEP in order to build upon functional gains made in therapy. 09/14/19    Baseline 09/10/19: met with current program which spouse/pt report are still challenging.    Status Achieved    Target Date 09/14/19      PT SHORT TERM GOAL #2   Title Pt will improve DGI score to at least a 14/24 in order to demo decr fall risk.    Baseline 09/10/19: 18/24 scored today    Status Achieved      PT SHORT TERM GOAL #3   Title Pt and pt's spouse will verbalize understanding of fall prevention in the home.    Baseline 09/10/19: met in session today    Time --    Period --    Status Achieved      PT SHORT TERM GOAL #4   Title Stairs to be assessed as appropriate - LTG to be written as appropriate.    Baseline 09/03/19: min guard assist with single rail sideways for 4 stairs x 4 reps in session today, step to pattern.    Status Achieved      PT SHORT TERM GOAL #5   Title Pt will perform 5x sit <> stand with no BLE bracing against mat table/chair and improved eccentric control in order  to demo improved safety with transfers.    Baseline 09/10/19: met in session today with time of 9.32 sec's with no UE assist, no retropulsion    Time --    Period --    Status Achieved             PT Long Term Goals - 09/07/19 0912      PT LONG TERM GOAL #1   Title Pt will be independent with final HEP in order to build upon  functional gains made in therapy. ALL LTGS DUE 10/12/19    Time 8    Period Weeks    Status New      PT LONG TERM GOAL #2   Title Pt will perform at least 12 steps with step to pattern vs. step through pattern with single handrail with supervision in order to safely enter/exit home and go to 2nd floor.    Time 8    Period Weeks    Status New      PT LONG TERM GOAL #3   Title Pt will improve DGI score to at least a 17/24 in order to demo decr fall risk.    Baseline 11/24    Time 8    Period Weeks    Status Revised      PT LONG TERM GOAL #4   Title Pt will perform TUG with no AD vs. LRAD in 13.5 seconds or less in order to demo decr fall risk.    Time 8    Period Weeks    Status New      PT LONG TERM GOAL #5   Title Pt will ambulate at least 500' with supervision over level indoor and unlevel outdoor surfaces in order to demo improved community mobility.    Time 8    Period Weeks    Status New                 Plan - 09/17/19 1059    Clinical Impression Statement Focus of today's skilled session continued to focus on balance reactions - on compliant surfaces and SLS. Intermittent rest breaks needed throughout session, pt HR increases to approx. 120-125 bpm after activity. Ocassional cues to slow down and times for incr control with balance activities with SLS. Will continue to progress towards LTGs.    Personal Factors and Comorbidities Comorbidity 3+;Past/Current Experience;Behavior Pattern    Comorbidities BPH, CAD, HTN, CKD III, prediabetes, mild cognitive impairment,    Examination-Activity Limitations Transfers;Stairs;Locomotion Level    Examination-Participation Restrictions Community Activity    Stability/Clinical Decision Making Evolving/Moderate complexity    Rehab Potential Good    PT Frequency 2x / week    PT Duration 8 weeks    PT Treatment/Interventions ADLs/Self Care Home Management;DME Instruction;Stair training;Therapeutic activities;Functional mobility  training;Gait training;Therapeutic exercise;Balance training;Neuromuscular re-education;Patient/family education;Vestibular    PT Next Visit Plan SLS activities. balance with head motions upgrade HEP as appropriate. continue gait training with LRAD/walking stick, balance and strengthening ex's.    PT Home Exercise Plan Access Code: [email or web address]    Consulted and Agree with Plan of Care Patient;Family member/caregiver    Family Member Consulted pt's spouse           Patient will benefit from skilled therapeutic intervention in order to improve the following deficits and impairments:  Abnormal gait, Decreased balance, Decreased activity tolerance, Decreased coordination, Decreased knowledge of use of DME, Decreased safety awareness, Decreased strength, Difficulty walking, Dizziness, Impaired UE functional use  Visit Diagnosis: Muscle weakness (generalized)  Other symptoms and signs involving the nervous system  Unsteadiness on feet  Difficulty in walking, not elsewhere classified     Problem List Patient Active Problem List   Diagnosis Date Noted  . Fracture of multiple ribs with pain 08/10/2019  . Clavicle fracture 08/10/2019  . Closed displaced fracture of phalanx of left thumb, sequela 08/10/2019  . TBI (traumatic brain injury) ([place]) 07/23/2019  . Traumatic closed fracture of distal clavicle with minimal displacement, left, initial encounter 07/19/2019  .  Traumatic brain injury with loss of consciousness ([place])   . Multiple trauma   . Benign prostatic hyperplasia   . Essential hypertension   . AKI (acute kidney injury) ([place])   . Stage 3b chronic kidney disease   . Prediabetes   . Acute blood loss anemia   . Thrombocytopenia ([place])   . Fall 07/14/2019  . Type 2 diabetes mellitus ([place])   . Eosinophilia   . Need for prophylactic vaccination and inoculation against influenza 11/12/2013  . Nocturnal leg cramps 11/23/2012  . Pain in joint, shoulder region 11/23/2012  . Fall  11/04/2012  . Hematoma 11/04/2012  . Acute upper respiratory infections of unspecified site 11/04/2012  . HYPERLIPIDEMIA TYPE IIB / III 03/08/2008  . Orthostatic hypotension 03/08/2008  . CAD, NATIVE VESSEL 03/08/2008     Arliss Journey, PT, DPT  09/17/2019, 11:01 AM  [facility] [address] [place], [place], [address] Phone: [phone]   Fax:  [phone]  Name: [name] MRN: [id number] Date of Birth: 1933-03-24

## 2019-09-22 ENCOUNTER — Encounter: Payer: Self-pay | Admitting: Occupational Therapy

## 2019-09-22 ENCOUNTER — Ambulatory Visit: Payer: PPO | Admitting: Occupational Therapy

## 2019-09-22 ENCOUNTER — Ambulatory Visit: Payer: PPO | Admitting: Physical Therapy

## 2019-09-22 ENCOUNTER — Ambulatory Visit: Payer: PPO | Admitting: Speech Pathology

## 2019-09-22 ENCOUNTER — Other Ambulatory Visit: Payer: Self-pay

## 2019-09-22 DIAGNOSIS — M6281 Muscle weakness (generalized): Secondary | ICD-10-CM

## 2019-09-22 DIAGNOSIS — M25642 Stiffness of left hand, not elsewhere classified: Secondary | ICD-10-CM

## 2019-09-22 DIAGNOSIS — R41840 Attention and concentration deficit: Secondary | ICD-10-CM

## 2019-09-22 DIAGNOSIS — R29818 Other symptoms and signs involving the nervous system: Secondary | ICD-10-CM

## 2019-09-22 DIAGNOSIS — R41841 Cognitive communication deficit: Secondary | ICD-10-CM

## 2019-09-22 DIAGNOSIS — M79642 Pain in left hand: Secondary | ICD-10-CM

## 2019-09-22 DIAGNOSIS — R262 Difficulty in walking, not elsewhere classified: Secondary | ICD-10-CM

## 2019-09-22 DIAGNOSIS — S069X0A Unspecified intracranial injury without loss of consciousness, initial encounter: Secondary | ICD-10-CM

## 2019-09-22 DIAGNOSIS — R2681 Unsteadiness on feet: Secondary | ICD-10-CM

## 2019-09-22 DIAGNOSIS — M25632 Stiffness of left wrist, not elsewhere classified: Secondary | ICD-10-CM

## 2019-09-22 NOTE — Therapy (Signed)
[facility] [address] [place], [place], [address] Phone: [phone]   Fax:  [phone]  Physical Therapy Treatment  Patient Details  Name: [name] MRN: [id number] Date of Birth: May 20, 1933 Referring Provider (PT): Reesa Chew PA-C   Encounter Date: 09/22/2019   PT End of Session - 09/22/19 1014    Visit Number 9    Number of Visits 17    Date for PT Re-Evaluation 11/12/19   written for 60 day POC   Authorization Type [employer] P[employer] - $15 co pay, collect 1 co pay per day for multiple disciplines    PT Start Time 0932    PT Stop Time 1013    PT Time Calculation (min) 41 min    Equipment Utilized During Treatment Gait belt    Activity Tolerance Patient tolerated treatment well    Behavior During Therapy [facility] for tasks assessed/performed;Impulsive           Past Medical History:  Diagnosis Date  . Allergy   . BPH (benign prostatic hyperplasia)   . BPH (benign prostatic hypertrophy)   . CAD (coronary artery disease)    s/p cypher DES to pLAD 6/08; normal LVF;  ETT-Myoview 2009: no ischemia   . Coronary artery disease   . Eosinophilia   . High cholesterol   . HTN (hypertension)   . Hyperlipidemia   . Hypertension   . MI (myocardial infarction) ([place])   . Myocardial infarction ([place])   . Prediabetes   . Trigeminal neuralgia     Past Surgical History:  Procedure Laterality Date  . APPENDECTOMY    . CARDIAC CATHETERIZATION  07/30/2006   CORONARY ANGIOPLASTY WITH STENT PLACEMENT  . CARDIAC CATHETERIZATION    . EXPLORATORY LAPAROTOMY     age 89  . EXPLORATORY LAPAROTOMY      There were no vitals filed for this visit.   Subjective Assessment - 09/22/19 0934    Subjective No changes - no falls.    Patient is accompained by: Family member   spouse   Pertinent History PMH: BPH, CAD, HTN, CKD III, prediabetes, mild cognitive impairment.    Patient Stated Goals wants to walk without  worrying about potentially having a dizzy spell - reports 4-5 spells a day that lasts only a couple of seconds.    Currently in Pain? Yes    Pain Score 2    "sitting pain is a 2/10, when doing something pain is a 6/10"   Pain Location Hand    Pain Orientation Left    Pain Descriptors / Indicators Aching    Pain Onset More than a month ago    Aggravating Factors  movement and doing things                                  Balance Exercises - 09/22/19 0954      Balance Exercises: Standing   Stepping Strategy Anterior;Posterior;Foam/compliant surface;10 reps;UE support    Stepping Strategy Limitations alternating x10 reps each direction, cues for technique, on  blue foam beam, pt able to progress to no UE support with posterior stepping strategy, cues at times for foot clearance    Rockerboard Anterior/posterior;Lateral;Head turns;EO;Limitations    Rockerboard Limitations x10 reps weight shifting in both A/P direction and M/L direction, need for min guard and intermittent UE support, holding board still in M/L x10 reps head nods, in A/P direction x10  reps head turns    Tandem Gait Forward;Limitations;Foam/compliant surface;3 reps    Tandem Gait Limitations down and back 3 reps on red mat, progressing to no UE support and min guard/min A for balance    Retro Gait Foam/compliant surface;Limitations    Retro Gait Limitations 4 reps, cues for slowed and controlled weight shift    Sidestepping Foam/compliant support;Limitations    Sidestepping Limitations down and back x2 reps on blue foam beam, with cues for foot clearance, no UE support, cues for slowed and controlled    Marching Foam/compliant surface;Forwards;Limitations    Marching Limitations x4 reps on red compliant mat, cues for slowed and controlled for SLS    Other Standing Exercises standing on rockerboard in A/P direction: reaching outside of BOS to R and grabbing bean bag from therapist and tossing into crate x15  reps, min guard/with occassional min A for balance               PT Short Term Goals - 09/10/19 1020      PT SHORT TERM GOAL #1   Title Pt will be independent with initial HEP in order to build upon functional gains made in therapy. 09/14/19    Baseline 09/10/19: met with current program which spouse/pt report are still challenging.    Status Achieved    Target Date 09/14/19      PT SHORT TERM GOAL #2   Title Pt will improve DGI score to at least a 14/24 in order to demo decr fall risk.    Baseline 09/10/19: 18/24 scored today    Status Achieved      PT SHORT TERM GOAL #3   Title Pt and pt's spouse will verbalize understanding of fall prevention in the home.    Baseline 09/10/19: met in session today    Time --    Period --    Status Achieved      PT SHORT TERM GOAL #4   Title Stairs to be assessed as appropriate - LTG to be written as appropriate.    Baseline 09/03/19: min guard assist with single rail sideways for 4 stairs x 4 reps in session today, step to pattern.    Status Achieved      PT SHORT TERM GOAL #5   Title Pt will perform 5x sit <> stand with no BLE bracing against mat table/chair and improved eccentric control in order  to demo improved safety with transfers.    Baseline 09/10/19: met in session today with time of 9.32 sec's with no UE assist, no retropulsion    Time --    Period --    Status Achieved             PT Long Term Goals - 09/07/19 0912      PT LONG TERM GOAL #1   Title Pt will be independent with final HEP in order to build upon functional gains made in therapy. ALL LTGS DUE 10/12/19    Time 8    Period Weeks    Status New      PT LONG TERM GOAL #2   Title Pt will perform at least 12 steps with step to pattern vs. step through pattern with single handrail with supervision in order to safely enter/exit home and go to 2nd floor.    Time 8    Period Weeks    Status New      PT LONG TERM GOAL #3   Title Pt will improve DGI score to  at least a  17/24 in order to demo decr fall risk.    Baseline 11/24    Time 8    Period Weeks    Status Revised      PT LONG TERM GOAL #4   Title Pt will perform TUG with no AD vs. LRAD in 13.5 seconds or less in order to demo decr fall risk.    Time 8    Period Weeks    Status New      PT LONG TERM GOAL #5   Title Pt will ambulate at least 500' with supervision over level indoor and unlevel outdoor surfaces in order to demo improved community mobility.    Time 8    Period Weeks    Status New                 Plan - 09/22/19 1015    Clinical Impression Statement Focus of today's skilled session continued to focus on balance strategies on compliant surfaces. Pt needing UE support for stepping strategies on blue foam beam and min guard/min A for balance activities today with no UE support. Pt needs cues to slow down at times. Will continue to progress towards LTGs.    Personal Factors and Comorbidities Comorbidity 3+;Past/Current Experience;Behavior Pattern    Comorbidities BPH, CAD, HTN, CKD III, prediabetes, mild cognitive impairment,    Examination-Activity Limitations Transfers;Stairs;Locomotion Level    Examination-Participation Restrictions Community Activity    Stability/Clinical Decision Making Evolving/Moderate complexity    Rehab Potential Good    PT Frequency 2x / week    PT Duration 8 weeks    PT Treatment/Interventions ADLs/Self Care Home Management;DME Instruction;Stair training;Therapeutic activities;Functional mobility training;Gait training;Therapeutic exercise;Balance training;Neuromuscular re-education;Patient/family education;Vestibular    PT Next Visit Plan 10th visit progress note. SLS activities. balance with head motions upgrade HEP as appropriate. continue gait training with LRAD/walking stick, balance and strengthening ex's.    PT Home Exercise Plan Access Code: [email or web address]    Consulted and Agree with Plan of Care Patient;Family member/caregiver    Family Member  Consulted pt's spouse           Patient will benefit from skilled therapeutic intervention in order to improve the following deficits and impairments:  Abnormal gait, Decreased balance, Decreased activity tolerance, Decreased coordination, Decreased knowledge of use of DME, Decreased safety awareness, Decreased strength, Difficulty walking, Dizziness, Impaired UE functional use  Visit Diagnosis: Muscle weakness (generalized)  Other symptoms and signs involving the nervous system  Difficulty in walking, not elsewhere classified  Unsteadiness on feet     Problem List Patient Active Problem List   Diagnosis Date Noted  . Fracture of multiple ribs with pain 08/10/2019  . Clavicle fracture 08/10/2019  . Closed displaced fracture of phalanx of left thumb, sequela 08/10/2019  . TBI (traumatic brain injury) ([place]) 07/23/2019  . Traumatic closed fracture of distal clavicle with minimal displacement, left, initial encounter 07/19/2019  . Traumatic brain injury with loss of consciousness ([place])   . Multiple trauma   . Benign prostatic hyperplasia   . Essential hypertension   . AKI (acute kidney injury) ([place])   . Stage 3b chronic kidney disease   . Prediabetes   . Acute blood loss anemia   . Thrombocytopenia ([place])   . Fall 07/14/2019  . Type 2 diabetes mellitus ([place])   . Eosinophilia   . Need for prophylactic vaccination and inoculation against influenza 11/12/2013  . Nocturnal leg cramps 11/23/2012  . Pain in joint, shoulder region 11/23/2012  .  Fall 11/04/2012  . Hematoma 11/04/2012  . Acute upper respiratory infections of unspecified site 11/04/2012  . HYPERLIPIDEMIA TYPE IIB / III 03/08/2008  . Orthostatic hypotension 03/08/2008  . CAD, NATIVE VESSEL 03/08/2008    Arliss Journey, PT, DPT  09/22/2019, 10:18 AM  [facility] [address] [place], [place], [address] Phone: [phone]   Fax:   [phone]  Name: [name] MRN: [id number] Date of Birth: 1933-04-16

## 2019-09-22 NOTE — Therapy (Signed)
[facility] [address] [place], [place], [address] Phone: [phone]   Fax:  [phone]  Occupational Therapy Treatment  Patient Details  Name: [name] MRN: [id number] Date of Birth: August 26, 1933 Referring Provider (OT): Reesa Chew   Encounter Date: 09/22/2019   OT End of Session - 09/22/19 1109    Visit Number 8    Number of Visits 17    Date for OT Re-Evaluation 10/27/19    Authorization Type [employer] - Follow Medicare guidelines    Authorization - Visit Number 8    Authorization - Number of Visits 10    Progress Note Due on Visit 10    OT Start Time 1106    OT Stop Time 1145    OT Time Calculation (min) 39 min    Activity Tolerance Patient tolerated treatment well    Behavior During Therapy [facility] for tasks assessed/performed;Impulsive           Past Medical History:  Diagnosis Date  . Allergy   . BPH (benign prostatic hyperplasia)   . BPH (benign prostatic hypertrophy)   . CAD (coronary artery disease)    s/p cypher DES to pLAD 6/08; normal LVF;  ETT-Myoview 2009: no ischemia   . Coronary artery disease   . Eosinophilia   . High cholesterol   . HTN (hypertension)   . Hyperlipidemia   . Hypertension   . MI (myocardial infarction) ([place])   . Myocardial infarction ([place])   . Prediabetes   . Trigeminal neuralgia     Past Surgical History:  Procedure Laterality Date  . APPENDECTOMY    . CARDIAC CATHETERIZATION  07/30/2006   CORONARY ANGIOPLASTY WITH STENT PLACEMENT  . CARDIAC CATHETERIZATION    . EXPLORATORY LAPAROTOMY     age 7  . EXPLORATORY LAPAROTOMY      There were no vitals filed for this visit.   Subjective Assessment - 09/22/19 1105    Currently in Pain? Yes    Pain Score 2    to 5/10 with use   Pain Location Hand    Pain Orientation Left    Pain Descriptors / Indicators Aching    Pain Type Acute pain    Pain Onset More than a month ago    Pain Frequency  Intermittent    Aggravating Factors  movement, holding book    Pain Relieving Factors heat            Fludio x8 min to L hand due to pain and stiffness.  Pt reports no pain and decr stiffness after Fludio.    Placing small pegs in pegboard to copy design.  Pt needed min cueing to avoid assisting with R hand for manipulation.  Pt needed incr time min-mod cueing for copying design accurately.  Removing with in-hand manipulation with min cueing/difficulty.  Reviewed coordination HEP:  Rotating ball in fingertips, dealing cards with thumb, and stacking coins, manipulating coins in hand to place in coin bank with only occasional min difficulty.        OT Short Term Goals - 09/22/19 1109      OT SHORT TERM GOAL #1   Title Patient will complete an HEP designed to improve left thumb range of motion with min cueing due 09/27/19    Time 4    Period Weeks    Status Achieved    Target Date 09/27/19      OT SHORT TERM GOAL #2   Title Patient will complete a  home exercise program designed to improve coordination in left hand with set up assistance    Time 4    Period Weeks    Status Achieved      OT SHORT TERM GOAL #3   Title Patient will don/doff a pull over shirt with set up assistance    Time 4    Period Weeks    Status Achieved      OT SHORT TERM GOAL #4   Title Patient will demonstrate low reach to obtain a lightweight (less than 3 lb) object with left hand    Time 4    Period Weeks    Status New             OT Long Term Goals - 09/22/19 1113      OT LONG TERM GOAL #1   Title Patient will complete HEP/Home activities program to improve functional mobility and use of left hand due 9/21    Time 8    Period Weeks    Status New      OT LONG TERM GOAL #2   Title Patient will bathe and dress himself with modified independence - with increased time    Time 8    Period Weeks    Status On-going   inconsistent with donning socks.  09/22/19:  supervision for bathing, Mod I for  dressing     OT LONG TERM GOAL #3   Title Patient will prepare a familiar hot meal using left hand as non dominant with supervision    Time 8    Period Weeks    Status On-going   done once at home     OT [place] #4   Title Patient will demonstrate ability to manipulate small items within left hand as evidenced by his ability to complete 9 hole peg test in less than 1 minute    Time 8    Period Weeks    Status New      OT LONG TERM GOAL #5   Title Patient will understand recommendations related to driving    Time 8    Period Weeks    Status New                 Plan - 09/22/19 1109    Clinical Impression Statement Pt is progressing with incr participation/independence with ADLs/IADLs.    Occupational performance deficits (Please refer to evaluation for details): ADL's;IADL's;Leisure;Rest and Sleep    Body Structure / Function / Physical Skills ADL;Coordination;Endurance;GMC;UE functional use;Sensation;Decreased knowledge of precautions;Balance;Body mechanics;Decreased knowledge of use of DME;Flexibility;IADL;Pain;Strength;FMC;Dexterity;Cardiopulmonary status limiting activity;ROM;Mobility;Tone    Cognitive Skills Attention;Thought;Problem Solve;Safety Awareness;Memory;Energy/Drive    OT Frequency 2x / week    OT Duration 8 weeks    OT Treatment/Interventions Self-care/ADL training;Electrical Stimulation;Iontophoresis;Therapeutic exercise;Moist Heat;Paraffin;Neuromuscular education;Splinting;Patient/family education;Balance training;Therapeutic activities;Functional Mobility Training;Fluidtherapy;Cryotherapy;Ultrasound;DME and/or AE instruction;Manual Therapy;Passive range of motion;Cognitive remediation/compensation    Plan continue with coordination, balance, functional cognition, check remaining STG/functional reaching    Consulted and Agree with Plan of Care Patient;Family member/caregiver           Patient will benefit from skilled therapeutic intervention in  order to improve the following deficits and impairments:   Body Structure / Function / Physical Skills: ADL, Coordination, Endurance, GMC, UE functional use, Sensation, Decreased knowledge of precautions, Balance, Body mechanics, Decreased knowledge of use of DME, Flexibility, IADL, Pain, Strength, FMC, Dexterity, Cardiopulmonary status limiting activity, ROM, Mobility, Tone Cognitive Skills: Attention, Thought, Problem Solve, Safety Awareness, Memory, Energy/Drive  Visit Diagnosis: Stiffness of left hand, not elsewhere classified  Stiffness of left wrist, not elsewhere classified  Pain in left hand  Muscle weakness (generalized)  Other symptoms and signs involving the nervous system  Attention and concentration deficit  Unsteadiness on feet    Problem List Patient Active Problem List   Diagnosis Date Noted  . Fracture of multiple ribs with pain 08/10/2019  . Clavicle fracture 08/10/2019  . Closed displaced fracture of phalanx of left thumb, sequela 08/10/2019  . TBI (traumatic brain injury) ([place]) 07/23/2019  . Traumatic closed fracture of distal clavicle with minimal displacement, left, initial encounter 07/19/2019  . Traumatic brain injury with loss of consciousness ([place])   . Multiple trauma   . Benign prostatic hyperplasia   . Essential hypertension   . AKI (acute kidney injury) ([place])   . Stage 3b chronic kidney disease   . Prediabetes   . Acute blood loss anemia   . Thrombocytopenia ([place])   . Fall 07/14/2019  . Type 2 diabetes mellitus ([place])   . Eosinophilia   . Need for prophylactic vaccination and inoculation against influenza 11/12/2013  . Nocturnal leg cramps 11/23/2012  . Pain in joint, shoulder region 11/23/2012  . Fall 11/04/2012  . Hematoma 11/04/2012  . Acute upper respiratory infections of unspecified site 11/04/2012  . HYPERLIPIDEMIA TYPE IIB / III 03/08/2008  . Orthostatic hypotension 03/08/2008  . CAD, NATIVE VESSEL 03/08/2008     [facility] 09/22/2019, 12:26 PM  [facility] [address] [place], [place], [address] Phone: [phone]   Fax:  [phone]  Name: SAYAN ALDAVA MRN: [id number] Date of Birth: 07-04-1933   Vianne Bulls, OTR/L [facility] [address] [place], [place]  [phone] phone [phone] 09/22/19 12:26 PM

## 2019-09-22 NOTE — Therapy (Signed)
[facility] [address] [place], [place], [address] Phone: [phone]   Fax:  [phone]  Speech Language Pathology Treatment  Patient Details  Name: [name] MRN: [id number] Date of Birth: Jan 30, 1934 Referring Provider (SLP): Reesa Chew, [place]   Encounter Date: 09/22/2019   End of Session - 09/22/19 1538    Visit Number 8    Number of Visits 17    Date for SLP Re-Evaluation 11/16/19    SLP Start Time 1324    SLP Stop Time  1100    SLP Time Calculation (min) 45 min           Past Medical History:  Diagnosis Date  . Allergy   . BPH (benign prostatic hyperplasia)   . BPH (benign prostatic hypertrophy)   . CAD (coronary artery disease)    s/p cypher DES to pLAD 6/08; normal LVF;  ETT-Myoview 2009: no ischemia   . Coronary artery disease   . Eosinophilia   . High cholesterol   . HTN (hypertension)   . Hyperlipidemia   . Hypertension   . MI (myocardial infarction) ([place])   . Myocardial infarction ([place])   . Prediabetes   . Trigeminal neuralgia     Past Surgical History:  Procedure Laterality Date  . APPENDECTOMY    . CARDIAC CATHETERIZATION  07/30/2006   CORONARY ANGIOPLASTY WITH STENT PLACEMENT  . CARDIAC CATHETERIZATION    . EXPLORATORY LAPAROTOMY     age 16  . EXPLORATORY LAPAROTOMY      There were no vitals filed for this visit.   Subjective Assessment - 09/22/19 1528    Subjective Pt came from PT    Currently in Pain? No/denies                 ADULT SLP TREATMENT - 09/22/19 1529      General Information   Behavior/Cognition Alert;Cooperative;Pleasant mood      Treatment Provided   Treatment provided Cognitive-Linquistic      Cognitive-Linquistic Treatment   Treatment focused on Cognition;Patient/family/caregiver education    Skilled Treatment "I was to give this to you right away," pt handed homework to SLP without cues. Accuracy for problem solving was 70%; pt was  unaware of errors. Required usual mod cues for double checking, error awareness, and selective/alternating attention. Pt verbalized impulsivity negatively impacted performance and SLP agreed with this. Demo'd pt breakdowns in attention when attempting to follow multiple steps often led to errors. Pt initially explained this away as "never being good at math," however SLP used non-mathematic errors to demo deficits in attention and awareness. Wife reported pt took evening meds in the morning last 2 days and "we had to call the doctor." Pt was unable to problem solve what a logical step might be to prevent this error in the future. SLP suggested pt's wife be present and have pt show her the correct slot in his pill box to confirm accuracy before taking meds, and pt/wife agreed they would do this. Wife cued pt appropriately when putting away home tasks, although tends to jump in quickly before pt processes he needs to make a change.       Assessment / Recommendations / Plan   Plan Continue with current plan of care      Progression Toward Goals   Progression toward goals Progressing toward goals            SLP Education - 09/22/19 1538    Education Details wife should  supervise meds and confirm accuracy before pt takes medications    Person(s) Educated Patient;Spouse    Methods Explanation    Comprehension Verbalized understanding            SLP Short Term Goals - 09/22/19 1539      SLP SHORT TERM GOAL #1   Title pt will demo knowledge of his memory copmensation system by attempt to use with rare min cues in 2 sessions    Baseline 09-16-19    Status Partially Met      SLP SHORT TERM GOAL #2   Title pt will demo correct/successful orientation to a memory compensation system in 3 sessions    Status Not Met      SLP SHORT TERM GOAL #3   Title pt will tell SLP 2 cognitive deficits independently, and how they may impact him at home with rare min A in 3 sessions    Status Not Met      SLP  SHORT TERM GOAL #4   Title pt will undergo further cognitive linguisitic testing PRN    Period --   or 7 total sessions   Status Achieved      SLP SHORT TERM GOAL #5   Title Patient will use compensations for attention during functional task (eg conversation or reading on topic of interest) with min cues x 2 sessions.    Status Not Met            SLP Long Term Goals - 09/22/19 1539      SLP LONG TERM GOAL #1   Title pt will demo correct usage of a memory compensation system for appointment and/or medication management, to-do lists, daily schedules in 3 sessions    Time 4    Period Weeks   or 17 total sessions, for all LTGs   Status On-going      SLP LONG TERM GOAL #2   Title pt will demo anticipatory awareness regarding deficit areas, independently, in 3 sessions    Time 4    Period Weeks    Status On-going      SLP LONG TERM GOAL #3   Title Pt will maintain selective attention in functional task for 8 minutes in mod noisy environment with rare min cues x 2 sessions.    Time 4    Period Weeks    Status New            Plan - 09/22/19 1538    Clinical Impression Statement Pt presents today with moderate-severe cognitive communication deficits; most significantly in attention and memory. Impulsivity, decreased awareness, and reduced frustration tolerance are also noted. Pt did not meet most of his STGs -LTGs to be modified PRN. Pt req'd consistent mod-max cues for where to look in memory book sections today -? pt use at home for something other than a place to get and place homework. Continue skilled ST to increase pt independence with schedule and daily tasks.    Speech Therapy Frequency 2x / week    Duration --   8 weeks or 17 total sessions   Treatment/Interventions Language facilitation;Cueing hierarchy;Cognitive reorganization;Internal/external aids;Patient/family education;Compensatory strategies;SLP instruction and feedback;Functional tasks;Environmental controls     Potential to Achieve Goals Good    Potential Considerations Severity of impairments;Ability to learn/carryover information    Consulted and Agree with Plan of Care Patient           Patient will benefit from skilled therapeutic intervention in order to improve the following deficits and  impairments:   Cognitive communication deficit  Traumatic brain injury, without loss of consciousness, initial encounter [facility])    Problem List Patient Active Problem List   Diagnosis Date Noted  . Fracture of multiple ribs with pain 08/10/2019  . Clavicle fracture 08/10/2019  . Closed displaced fracture of phalanx of left thumb, sequela 08/10/2019  . TBI (traumatic brain injury) ([place]) 07/23/2019  . Traumatic closed fracture of distal clavicle with minimal displacement, left, initial encounter 07/19/2019  . Traumatic brain injury with loss of consciousness ([place])   . Multiple trauma   . Benign prostatic hyperplasia   . Essential hypertension   . AKI (acute kidney injury) ([place])   . Stage 3b chronic kidney disease   . Prediabetes   . Acute blood loss anemia   . Thrombocytopenia ([place])   . Fall 07/14/2019  . Type 2 diabetes mellitus ([place])   . Eosinophilia   . Need for prophylactic vaccination and inoculation against influenza 11/12/2013  . Nocturnal leg cramps 11/23/2012  . Pain in joint, shoulder region 11/23/2012  . Fall 11/04/2012  . Hematoma 11/04/2012  . Acute upper respiratory infections of unspecified site 11/04/2012  . HYPERLIPIDEMIA TYPE IIB / III 03/08/2008  . Orthostatic hypotension 03/08/2008  . CAD, NATIVE VESSEL 03/08/2008   Deneise Lever, [place], [place] Speech-Language Pathologist  Aliene Altes 09/22/2019, 3:40 PM  [facility] [address] [place], [place], [address] Phone: [phone]   Fax:  [phone]   Name: THALES KNIPPLE MRN: [id number] Date of Birth: [birth year]

## 2019-09-23 ENCOUNTER — Telehealth: Payer: Self-pay | Admitting: Cardiovascular Disease

## 2019-09-23 ENCOUNTER — Ambulatory Visit
Admission: RE | Admit: 2019-09-23 | Discharge: 2019-09-23 | Disposition: A | Discharge status: Home / Self Care | Payer: PPO | Source: Ambulatory Visit | Attending: Physical Medicine and Rehabilitation | Admitting: Physical Medicine and Rehabilitation

## 2019-09-23 ENCOUNTER — Encounter: Payer: Self-pay | Admitting: Physical Medicine and Rehabilitation

## 2019-09-23 ENCOUNTER — Other Ambulatory Visit: Payer: Self-pay | Admitting: Physical Medicine and Rehabilitation

## 2019-09-23 ENCOUNTER — Encounter: Payer: PPO | Attending: Registered Nurse | Admitting: Physical Medicine and Rehabilitation

## 2019-09-23 VITALS — BP 116/70 | HR 84 | Temp 98.1°F | Ht 67.0 in | Wt 186.0 lb

## 2019-09-23 DIAGNOSIS — W19XXXS Unspecified fall, sequela: Secondary | ICD-10-CM

## 2019-09-23 DIAGNOSIS — S069X9D Unspecified intracranial injury with loss of consciousness of unspecified duration, subsequent encounter: Secondary | ICD-10-CM | POA: Diagnosis not present

## 2019-09-23 DIAGNOSIS — J181 Lobar pneumonia, unspecified organism: Secondary | ICD-10-CM | POA: Diagnosis not present

## 2019-09-23 DIAGNOSIS — R0602 Shortness of breath: Secondary | ICD-10-CM

## 2019-09-23 DIAGNOSIS — J90 Pleural effusion, not elsewhere classified: Secondary | ICD-10-CM | POA: Diagnosis not present

## 2019-09-23 DIAGNOSIS — S62502S Fracture of unspecified phalanx of left thumb, sequela: Secondary | ICD-10-CM | POA: Insufficient documentation

## 2019-09-23 IMAGING — CR DG CHEST 2V
2 series · 2 of 2 positions shown · non-contrast
Comparison: [DATE]

CLINICAL DATA: Shortness of breath, pleural effusion

EXAM:
CHEST - 2 VIEW

[w chest pa]
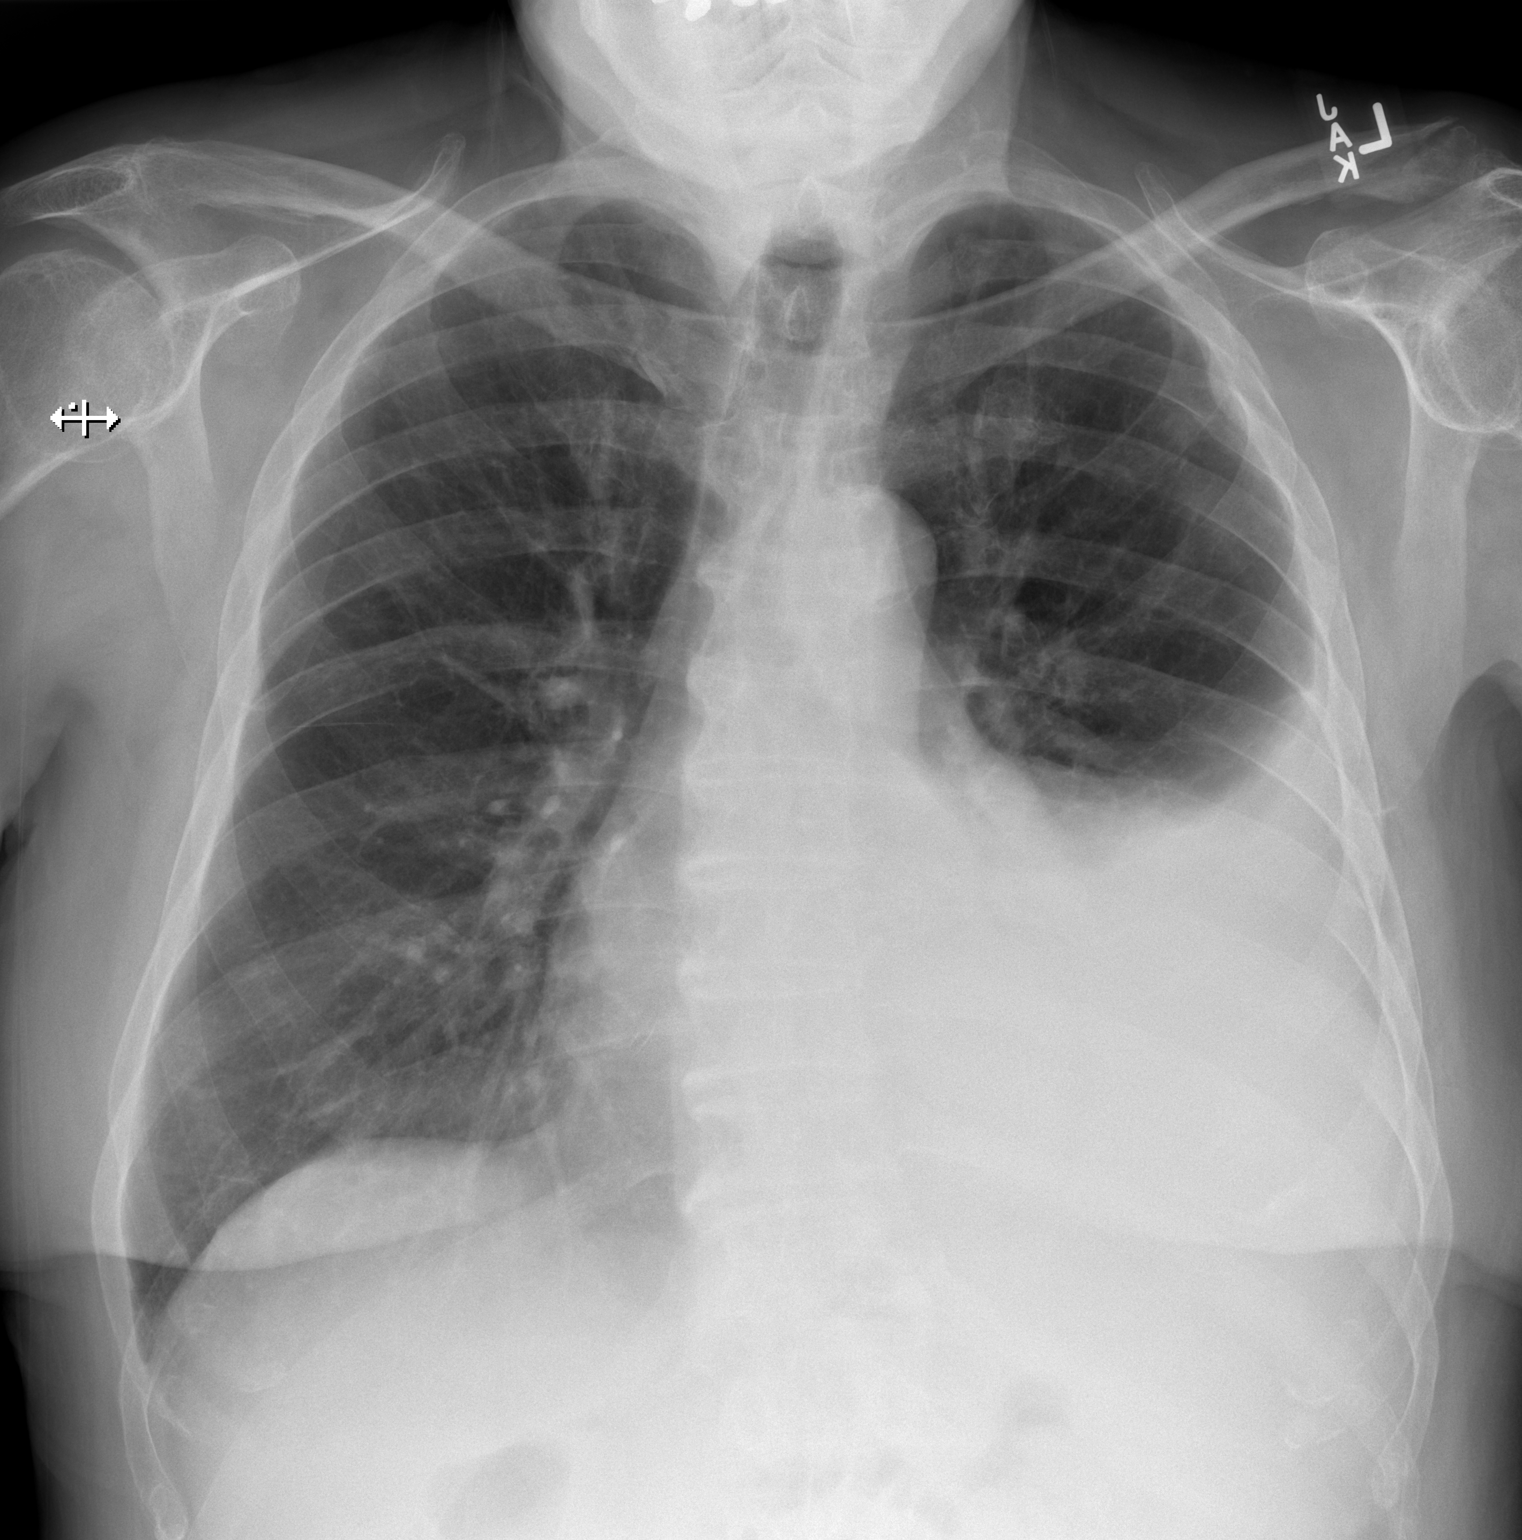

[w chest lat]
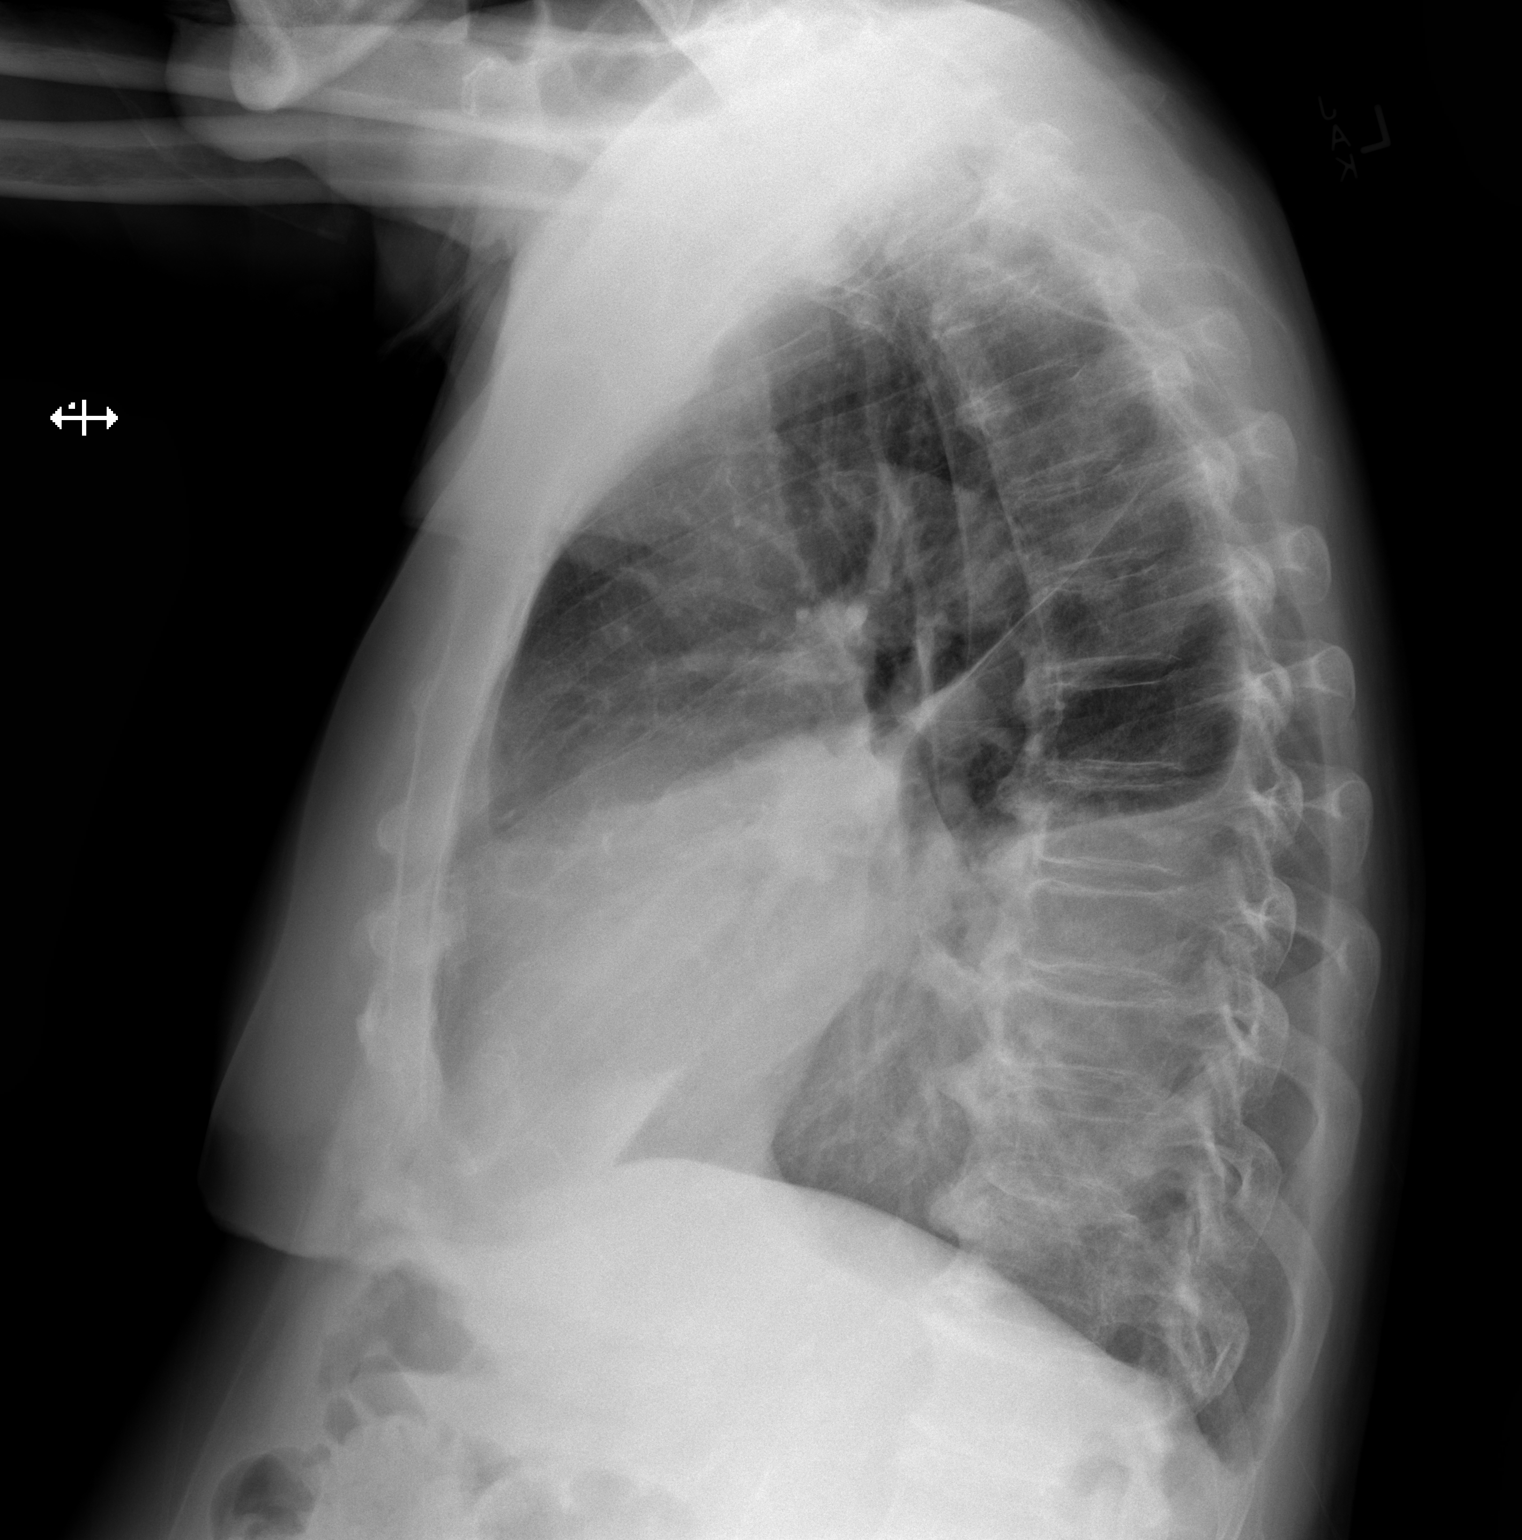

[2 of 2 positions shown; findings below may reference images not displayed]

FINDINGS: Frontal and lateral of the views of the chest demonstrate interval
enlargement left pleural effusion, now with opacification of the
lower half of the left hemithorax. There is underlying left lung
consolidation. Right chest is clear. No pneumothorax. Cardiac
silhouette is stable. No acute bony abnormalities.
IMPRESSION: 1. Increasing left basilar consolidation and effusion.

## 2019-09-23 MED ORDER — FUROSEMIDE 20 MG PO TABS: 20.0000 mg | ORAL_TABLET | Freq: Every day | ORAL | 0 refills | Status: DC

## 2019-09-23 NOTE — Telephone Encounter (Signed)
[name] is calling stating Coda was seen today and was advised to ask for a sooner appt due to thinking he may have congested heart failure. Please advise.

## 2019-09-23 NOTE — Addendum Note (Signed)
Addended by: Izora Ribas on: 09/23/2019 04:03 PM   Modules accepted: Orders

## 2019-09-23 NOTE — Telephone Encounter (Signed)
[name] had an appointment with Rehab today (notes in Epic). He has had SOB and a known pleural effusion. A repeat CXR was ordered and it showed the effusion has increased in size. Lasix 20 mg daily was ordered and the patient will start ASAP.  He was told to call Dr. Antionette Char office and arrange an earlier appointment. Scheduled the patient with Dr. [name] 8/25.  He understands he will be called if Dr. [name] has any other recommendations prior to appointment. He was grateful for assistance.

## 2019-09-23 NOTE — Progress Notes (Signed)
Subjective:    Patient ID: [name], male    DOB: 1933-06-13, 86 y.o.   MRN: [id number]  HPI  Mrs. Elsen is an 86 year old man who presents for hospital follow-up after traumatic brain injury.   His wife is concerned that he naps 1-2 times per day in the afternoon. Sleeps well at night. He is getting up to urinate and has bedside commode. He does not get up to urinate as frequently.  He does has have night sweats at night. There was no evidence of infection.   Reviewed results of CT together.   Asks about return to driving.  Has increase in the shortness of breath. The bReo was forgotten for a few days. More labored breathing at night. Has asthma and he was off Breo for sometimes but has restarted. The fluid in his   His wife asks whether to get an MRI of his spone.  H is not using the incentive spirometer as much as he should. He has also been out of breath with therapy. He has been working very hard there.   ife is about to back to work part time. Her boss is understanding Not going to church. Got the vaccine both shots. Hopes to get booster. Don't knwo who is vacinated and who is.   No Echo in Chart. Has October follow-up in cardiology.   Wife did a two week round of prednisone which made it hard for to breath.    Pain Inventory Average Pain 3 Pain Right Now 3 My pain is intermittent and sharp  In the last 24 hours, has pain interfered with the following? General activity 4 Relation with others 3 Enjoyment of life 8 What TIME of day is your pain at its worst? evening Sleep (in general) Fair  Pain is worse with: some activites Pain improves with: heat/ice Relief from Meds: 0  Family History  Problem Relation Age of Onset  . Stomach cancer Mother   . Heart disease Father   . Heart disease Brother   . Aortic aneurysm Brother   . Colon cancer Brother   . Coronary artery disease Father 44  . Coronary artery disease Brother   . Aortic aneurysm Brother     . Colon cancer Brother   . Esophageal cancer Neg Hx   . Rectal cancer Neg Hx   . Arthritis Daughter        rheumatoid   Social History   Socioeconomic History  . Marital status: Married    Spouse name: [name]  . Number of children: 1  . Years of education: Not on file  . Highest education level: Not on file  Occupational History  . Occupation: retired professor    Comment: History   . Occupation: missionary  Tobacco Use  . Smoking status: Never Smoker  . Smokeless tobacco: Never Used  . Tobacco comment: quit over 10yrs ago.  Substance and Sexual Activity  . Alcohol use: Never  . Drug use: Never  . Sexual activity: Yes    Partners: Female  Other Topics Concern  . Not on file  Social History Narrative   ** Merged History Encounter **       Lives with his wife (second marriage in 2015, first marriage ended when his wife died alzheimer's disease). Since his remarriage, his adult daughter doesn't speak with him.   Social Determinants of Health   Financial Resource Strain:   . Difficulty of Paying Living Expenses:   Food Insecurity:   .  Worried About Charity fundraiser in the Last Year:   . Arboriculturist in the Last Year:   Transportation Needs:   . Film/video editor (Medical):   Marland Kitchen Lack of Transportation (Non-Medical):   Physical Activity:   . Days of Exercise per Week:   . Minutes of Exercise per Session:   Stress:   . Feeling of Stress :   Social Connections:   . Frequency of Communication with Friends and Family:   . Frequency of Social Gatherings with Friends and Family:   . Attends Religious Services:   . Active Member of Clubs or Organizations:   . Attends Archivist Meetings:   Marland Kitchen Marital Status:    Past Surgical History:  Procedure Laterality Date  . APPENDECTOMY    . CARDIAC CATHETERIZATION  07/30/2006   CORONARY ANGIOPLASTY WITH STENT PLACEMENT  . CARDIAC CATHETERIZATION    . EXPLORATORY LAPAROTOMY     age 32  . EXPLORATORY  LAPAROTOMY     Past Surgical History:  Procedure Laterality Date  . APPENDECTOMY    . CARDIAC CATHETERIZATION  07/30/2006   CORONARY ANGIOPLASTY WITH STENT PLACEMENT  . CARDIAC CATHETERIZATION    . EXPLORATORY LAPAROTOMY     age 31  . EXPLORATORY LAPAROTOMY     Past Medical History:  Diagnosis Date  . Allergy   . BPH (benign prostatic hyperplasia)   . BPH (benign prostatic hypertrophy)   . CAD (coronary artery disease)    s/p cypher DES to pLAD 6/08; normal LVF;  ETT-Myoview 2009: no ischemia   . Coronary artery disease   . Eosinophilia   . High cholesterol   . HTN (hypertension)   . Hyperlipidemia   . Hypertension   . MI (myocardial infarction) ([place])   . Myocardial infarction ([place])   . Prediabetes   . Trigeminal neuralgia    BP 116/70   Pulse 84   Temp 98.1 F (36.7 C)   Ht 5\' 7"  (1.702 m)   Wt 186 lb (84.4 kg)   SpO2 95%   BMI 29.13 kg/m   Opioid Risk Score:   Fall Risk Score:  `1  Depression screen PHQ 2/9  Depression screen [facility] 2/9 09/23/2019 04/07/2018 03/18/2018 10/10/2017 05/06/2017 04/04/2017 04/02/2016  Decreased Interest 1 1 2  0 0 0 0  Down, Depressed, Hopeless 0 0 1 0 0 0 0  PHQ - 2 Score 1 1 3  0 0 0 0  Altered sleeping - 0 2 - - - -  Tired, decreased energy - 0 0 - - - -  Change in appetite - 0 0 - - - -  Feeling bad or failure about yourself  - 0 0 - - - -  Trouble concentrating - 0 0 - - - -  Moving slowly or fidgety/restless - 0 0 - - - -  Suicidal thoughts - 0 0 - - - -  PHQ-9 Score - 1 5 - - - -  Difficult doing work/chores - Not difficult at all Not difficult at all - - - -   Review of Systems  Musculoskeletal: Positive for gait problem.  Neurological: Positive for weakness.  All other systems reviewed and are negative.     Objective:   Physical Exam Gen: no distress, normal appearing HEENT: oral mucosa pink and moist, NCAT Cardio: Reg rate Chest: normal effort, normal rate of breathing Abd: soft, non-distended Ext: no edema Skin:  intact Neuro: Alert and oriented x3.  Musculoskeletal: 5/5 strength  throughout.  Psych: pleasant, normal affect    Assessment & Plan:  Mr. Vise is an 86 year old man who presents for hospital follow-up of TBI.  1) Impaired mobility and ADLs -Continue outpatient therapy -mobility is much improved.   2) Cognitive deficits -much improved -continue SLP outpatient  3) Shortness of breath:  -CXR to assess for pleural effusion. I will call with results.  -Cardiology appointment is scheduled for October. -He has no lower extremity edema or diagnosis of CHF  4) Napping during the day -Advised that naps are ok as long as he is sleeping well at night. Can be very refreshing.   5) Hypertension -BP is well controlled. Does not require Losartan at this time.  6) Driving: RETURN TO DRIVING PLAN:  WITH THE SUPERVISION OF A LICENSED DRIVER, PLEASE DRIVE IN AN EMPTY PARKING LOT FOR AT LEAST 2-3 TRIALS TO TEST REACTION TIME, VISION, USE OF EQUIPMENT IN CAR, ETC.  IF SUCCESSFUL WITH THE PARKING LOT DRIVING, PROCEED TO SUPERVISED DRIVING TRIALS IN YOUR NEIGHBORHOOD STREETS AT LOW TRAFFIC TIMES TO TEST OBSERVATION TO TRAFFIC SIGNALS, REACTION TIME, ETC. PLEASE ATTEMPT AT LEAST 2-3 TRIALS IN YOUR NEIGHBORHOOD.  IF NEIGHBORHOOD DRIVING IS SUCCESSFUL, YOU MAY PROCEED TO DRIVING IN BUSIER AREAS IN YOUR COMMUNITY WITH SUPERVISION OF A LICENSED DRIVER. PLEASE ATTEMPT AT LEAST 4-5 TRIALS.  IF COMMUNITY DRIVING IS SUCCESSFUL, YOU MAY PROCEED TO DRIVING ALONE, DURING THE DAY TIME, PLEASE DO NOT DRIVE IF YOU FEEL FATIGUED OR UNDER THE INFLUENCE OF MEDICATION.   >40 minutes spent in discussion of PT, OT, SLP, return to driving, shortness of breath, ordering of CXR, discussion of mobility and cognition, discussion of cardiology follow-up, assessment and discussion of BP, discussion of COVID booster and importance of socialization, going to church.

## 2019-09-24 ENCOUNTER — Ambulatory Visit: Payer: PPO | Admitting: Speech Pathology

## 2019-09-24 ENCOUNTER — Ambulatory Visit: Payer: PPO | Admitting: Physical Therapy

## 2019-09-24 ENCOUNTER — Ambulatory Visit: Payer: PPO | Admitting: Occupational Therapy

## 2019-09-25 ENCOUNTER — Other Ambulatory Visit (HOSPITAL_COMMUNITY): Payer: PPO

## 2019-09-25 NOTE — Telephone Encounter (Signed)
Thanks - will see as scheduled next week and decide on testing at that time.

## 2019-09-29 ENCOUNTER — Ambulatory Visit: Payer: PPO | Admitting: Occupational Therapy

## 2019-09-29 ENCOUNTER — Ambulatory Visit: Payer: PPO | Admitting: Physical Therapy

## 2019-09-29 ENCOUNTER — Ambulatory Visit: Payer: PPO

## 2019-09-29 ENCOUNTER — Encounter: Payer: Self-pay | Admitting: Physical Therapy

## 2019-09-29 ENCOUNTER — Other Ambulatory Visit: Payer: Self-pay

## 2019-09-29 DIAGNOSIS — M6281 Muscle weakness (generalized): Secondary | ICD-10-CM | POA: Diagnosis not present

## 2019-09-29 DIAGNOSIS — M25642 Stiffness of left hand, not elsewhere classified: Secondary | ICD-10-CM

## 2019-09-29 DIAGNOSIS — R41841 Cognitive communication deficit: Secondary | ICD-10-CM

## 2019-09-29 DIAGNOSIS — R2681 Unsteadiness on feet: Secondary | ICD-10-CM

## 2019-09-29 DIAGNOSIS — R29818 Other symptoms and signs involving the nervous system: Secondary | ICD-10-CM

## 2019-09-29 DIAGNOSIS — R262 Difficulty in walking, not elsewhere classified: Secondary | ICD-10-CM

## 2019-09-29 DIAGNOSIS — M79642 Pain in left hand: Secondary | ICD-10-CM

## 2019-09-29 NOTE — Therapy (Addendum)
[facility] [address] [place], [place], [address] Phone: [phone]   Fax:  [phone]  Physical Therapy Treatment/10th Visit Progress Note  Patient Details  Name: [name] MRN: [id number] Date of Birth: Nov 10, 1933 Referring Provider (PT): Reesa Chew PA-C  10th Visit Physical Therapy Progress Note  Dates of Reporting Period: 08/13/19 to 09/29/19   Encounter Date: 09/29/2019   PT End of Session - 09/29/19 0939    Visit Number 10    Number of Visits 17    Date for PT Re-Evaluation 11/12/19   written for 60 day POC   Authorization Type [employer] P[employer] - $15 co pay, collect 1 co pay per day for multiple disciplines    PT Start Time [id number]    PT Stop Time 1015    PT Time Calculation (min) 44 min    Equipment Utilized During Treatment Gait belt    Activity Tolerance Patient tolerated treatment well    Behavior During Therapy [facility] for tasks assessed/performed;Impulsive           Past Medical History:  Diagnosis Date  . Allergy   . BPH (benign prostatic hyperplasia)   . BPH (benign prostatic hypertrophy)   . CAD (coronary artery disease)    s/p cypher DES to pLAD 6/08; normal LVF;  ETT-Myoview 2009: no ischemia   . Coronary artery disease   . Eosinophilia   . High cholesterol   . HTN (hypertension)   . Hyperlipidemia   . Hypertension   . MI (myocardial infarction) ([place])   . Myocardial infarction ([place])   . Prediabetes   . Trigeminal neuralgia     Past Surgical History:  Procedure Laterality Date  . APPENDECTOMY    . CARDIAC CATHETERIZATION  07/30/2006   CORONARY ANGIOPLASTY WITH STENT PLACEMENT  . CARDIAC CATHETERIZATION    . EXPLORATORY LAPAROTOMY     age 63  . EXPLORATORY LAPAROTOMY      There were no vitals filed for this visit.   Subjective Assessment - 09/29/19 0934    Subjective Had chest xray and CT scan last Wed that showed increased fluid on his left lung. Added a fluid  pill that he started last Thursday. See's cardiology tomorrow to discuss options on how to deal with the fluid and to have an Echo Cardiogram. Reports some shortness of breath, mostly with lying down and when gettng up in the am. Spouse reports they have not noticed any significant issues during the day, have been limiting stairs to once a day. Has also been having dizzy spells again, 2-3 times a day. Mostly with standing up quickly.    Patient is accompained by: Family member   spouse   Pertinent History PMH: BPH, CAD, HTN, CKD III, prediabetes, mild cognitive impairment.    Patient Stated Goals wants to walk without worrying about potentially having a dizzy spell - reports 4-5 spells a day that lasts only a couple of seconds.    Currently in Pain? No/denies    Pain Score 0-No pain                 [facility] Adult PT Treatment/Exercise - 09/29/19 0941      Transfers   Transfers Sit to Stand;Stand to Sit    Sit to Stand 5: Supervision;With upper extremity assist;From bed;From chair/3-in-1    Stand to Sit 5: Supervision;With upper extremity assist;To bed;To chair/3-in-1      Ambulation/Gait   Ambulation/Gait Yes    Ambulation/Gait Assistance  5: Supervision    Ambulation/Gait Assistance Details into/out of gym with walking stick. use of no AD around gym with supervison. No balance issues noted with session.     Assistive device Other (Comment);None   walking stick   Gait Pattern Step-through pattern;Decreased stride length;Decreased arm swing - left    Ambulation Surface Level;Indoor    Gait Comments gait around track with speed changes, scanning the enviroment all directions randomly with supervision, no balance loss noted. HR 108, SaO2 97%.              High Level Balance   High Level Balance Activities Side stepping;Marching forwards;Marching backwards;Tandem walking   tandem fwd/bwd   High Level Balance Comments on red/blue mats next to counter top: 3 laps each with cues on correct ex  form/technique and weight shifitng. min guard to min assist, cues to slow down needed at times. HR 110, SaO2 96%, Dyspnea 2-3/4               Balance Exercises - 09/29/19 0001      Balance Exercises: Standing   Rockerboard Anterior/posterior;Lateral;EO;EC;30 seconds;Intermittent UE support;Limitations    Rockerboard Limitations performed both ways on balance board: rocking the board with EO, progressing to EC with up to mod assist needed with EC. then holding the board steady for EC no head movements with up to min/mod assist for balance. cues on posture/weight shifting needed for balance assistance.     Balance Beam standing across read foam beam with no UE support- alternating fwd stepping to floor/back onto bean, then alternating bwd stepping to floor/back onto beam for 10 reps each side with min to mod assist for balance. cues on increased step length and step height.                PT Short Term Goals - 09/10/19 1020      PT SHORT TERM GOAL #1   Title Pt will be independent with initial HEP in order to build upon functional gains made in therapy. 09/14/19    Baseline 09/10/19: met with current program which spouse/pt report are still challenging.    Status Achieved    Target Date 09/14/19      PT SHORT TERM GOAL #2   Title Pt will improve DGI score to at least a 14/24 in order to demo decr fall risk.    Baseline 09/10/19: 18/24 scored today    Status Achieved      PT SHORT TERM GOAL #3   Title Pt and pt's spouse will verbalize understanding of fall prevention in the home.    Baseline 09/10/19: met in session today    Time --    Period --    Status Achieved      PT SHORT TERM GOAL #4   Title Stairs to be assessed as appropriate - LTG to be written as appropriate.    Baseline 09/03/19: min guard assist with single rail sideways for 4 stairs x 4 reps in session today, step to pattern.    Status Achieved      PT SHORT TERM GOAL #5   Title Pt will perform 5x sit <> stand with  no BLE bracing against mat table/chair and improved eccentric control in order  to demo improved safety with transfers.    Baseline 09/10/19: met in session today with time of 9.32 sec's with no UE assist, no retropulsion    Time --    Period --    Status Achieved  PT Long Term Goals - 09/07/19 0912      PT LONG TERM GOAL #1   Title Pt will be independent with final HEP in order to build upon functional gains made in therapy. ALL LTGS DUE 10/12/19    Time 8    Period Weeks    Status New      PT LONG TERM GOAL #2   Title Pt will perform at least 12 steps with step to pattern vs. step through pattern with single handrail with supervision in order to safely enter/exit home and go to 2nd floor.    Time 8    Period Weeks    Status New      PT LONG TERM GOAL #3   Title Pt will improve DGI score to at least a 17/24 in order to demo decr fall risk.    Baseline 11/24    Time 8    Period Weeks    Status Revised      PT LONG TERM GOAL #4   Title Pt will perform TUG with no AD vs. LRAD in 13.5 seconds or less in order to demo decr fall risk.    Time 8    Period Weeks    Status New      PT LONG TERM GOAL #5   Title Pt will ambulate at least 500' with supervision over level indoor and unlevel outdoor surfaces in order to demo improved community mobility.    Time 8    Period Weeks    Status New                 Plan - 09/29/19 0940    Clinical Impression Statement 10th visit progress note: Today's skilled session focused initially on dynamic gait with no AD with no balance issues noted. No AD used for remainder of session which continued to focus on balance reactions on compliant surfaces. Rest breaks needed to check VS and due to shortness of breath. SaO2 stable with session. HR did elevate with activity with HR max of 120 bpm after balance board activities. Pt recovered quickly with seated rest breaks. The pt is progressing toward goals and should benefit from continued  PT to progress toward unmet goals.    Personal Factors and Comorbidities Comorbidity 3+;Past/Current Experience;Behavior Pattern    Comorbidities BPH, CAD, HTN, CKD III, prediabetes, mild cognitive impairment,    Examination-Activity Limitations Transfers;Stairs;Locomotion Level    Examination-Participation Restrictions Community Activity    Stability/Clinical Decision Making Evolving/Moderate complexity    Rehab Potential Good    PT Frequency 2x / week    PT Duration 8 weeks    PT Treatment/Interventions ADLs/Self Care Home Management;DME Instruction;Stair training;Therapeutic activities;Functional mobility training;Gait training;Therapeutic exercise;Balance training;Neuromuscular re-education;Patient/family education;Vestibular    PT Next Visit Plan SLS activities. balance with head motions upgrade HEP as appropriate. continue gait training with LRAD/walking stick, balance and strengthening ex's.    PT Home Exercise Plan Access Code: [email or web address]    Consulted and Agree with Plan of Care Patient;Family member/caregiver    Family Member Consulted pt's spouse           Patient will benefit from skilled therapeutic intervention in order to improve the following deficits and impairments:  Abnormal gait, Decreased balance, Decreased activity tolerance, Decreased coordination, Decreased knowledge of use of DME, Decreased safety awareness, Decreased strength, Difficulty walking, Dizziness, Impaired UE functional use  Visit Diagnosis: Muscle weakness (generalized)  Other symptoms and signs involving the nervous system  Difficulty in walking, not  elsewhere classified  Unsteadiness on feet     Problem List Patient Active Problem List   Diagnosis Date Noted  . Fracture of multiple ribs with pain 08/10/2019  . Clavicle fracture 08/10/2019  . Closed displaced fracture of phalanx of left thumb, sequela 08/10/2019  . TBI (traumatic brain injury) ([place]) 07/23/2019  . Traumatic closed fracture of  distal clavicle with minimal displacement, left, initial encounter 07/19/2019  . Traumatic brain injury with loss of consciousness ([place])   . Multiple trauma   . Benign prostatic hyperplasia   . Essential hypertension   . AKI (acute kidney injury) ([place])   . Stage 3b chronic kidney disease   . Prediabetes   . Acute blood loss anemia   . Thrombocytopenia ([place])   . Fall 07/14/2019  . Type 2 diabetes mellitus ([place])   . Eosinophilia   . Need for prophylactic vaccination and inoculation against influenza 11/12/2013  . Nocturnal leg cramps 11/23/2012  . Pain in joint, shoulder region 11/23/2012  . Fall 11/04/2012  . Hematoma 11/04/2012  . Acute upper respiratory infections of unspecified site 11/04/2012  . HYPERLIPIDEMIA TYPE IIB / III 03/08/2008  . Orthostatic hypotension 03/08/2008  . CAD, NATIVE VESSEL 03/08/2008    Willow Ora, PTA, [employer] [address] [place], [place] [address] [phone] 09/29/19, 8:40 PM   Name: [name] MRN: [id number] Date of Birth: [birth year]   Janann August, PT, DPT 10/01/19 11:33 AM

## 2019-09-29 NOTE — Therapy (Signed)
[facility] [address] [place], [place], [address] Phone: [phone]   Fax:  [phone]  Occupational Therapy Treatment  Patient Details  Name: [name] MRN: [id number] Date of Birth: May 03, 1933 Referring Provider (OT): Reesa Chew   Encounter Date: 09/29/2019   OT End of Session - 09/29/19 1106    Visit Number 9    Number of Visits 17    Date for OT Re-Evaluation 10/27/19    Authorization Type [employer] - Follow Medicare guidelines    Authorization - Visit Number 9    Authorization - Number of Visits 10    Progress Note Due on Visit 10    OT Start Time 1020    OT Stop Time 1100    OT Time Calculation (min) 40 min    Activity Tolerance Patient tolerated treatment well    Behavior During Therapy [facility] for tasks assessed/performed;Impulsive           Past Medical History:  Diagnosis Date  . Allergy   . BPH (benign prostatic hyperplasia)   . BPH (benign prostatic hypertrophy)   . CAD (coronary artery disease)    s/p cypher DES to pLAD 6/08; normal LVF;  ETT-Myoview 2009: no ischemia   . Coronary artery disease   . Eosinophilia   . High cholesterol   . HTN (hypertension)   . Hyperlipidemia   . Hypertension   . MI (myocardial infarction) ([place])   . Myocardial infarction ([place])   . Prediabetes   . Trigeminal neuralgia     Past Surgical History:  Procedure Laterality Date  . APPENDECTOMY    . CARDIAC CATHETERIZATION  07/30/2006   CORONARY ANGIOPLASTY WITH STENT PLACEMENT  . CARDIAC CATHETERIZATION    . EXPLORATORY LAPAROTOMY     age 86  . EXPLORATORY LAPAROTOMY      There were no vitals filed for this visit.   Subjective Assessment - 09/29/19 1024    Subjective  I have fluid on the Lt lung and I will have an ECHO tomorrow.    Patient is accompanied by: Family member    Limitations NWB LUE, Thumb splint    Currently in Pain? No/denies    Pain Onset More than a month ago               [facility] OT Assessment - 09/29/19 0001      Coordination   Left 9 Hole Peg Test 34.69 sec           Functional mid level reaching LUE to place graded resistance clothespins on antenna then removing (all yellow and red resistance) x 2.   Began assessing remaining STG's and progress towards LTG's. Pt has improved in ADLS and significantly in coordination Lt hand (see above assessment, pt unable at evaluation).  Also discussed continued pain in Lt hand 4-8/10 at times - suspect arthritis from fracture however encouraged pt/wife to discuss w/ orthopedic MD at upcoming appointment.  Discussed progress towards goals and what he is still finding challenging - most difficulty d/t cognitive deficits (sequencing, problem solving, memory, processing speed)                    OT Short Term Goals - 09/29/19 1105      OT SHORT TERM GOAL #1   Title Patient will complete an HEP designed to improve left thumb range of motion with min cueing due 09/27/19    Time 4    Period Weeks  Status Achieved    Target Date 09/27/19      OT SHORT TERM GOAL #2   Title Patient will complete a home exercise program designed to improve coordination in left hand with set up assistance    Time 4    Period Weeks    Status Achieved      OT SHORT TERM GOAL #3   Title Patient will don/doff a pull over shirt with set up assistance    Time 4    Period Weeks    Status Achieved      OT SHORT TERM GOAL #4   Title Patient will demonstrate low reach to obtain a lightweight (less than 3 lb) object with left hand    Time 4    Period Weeks    Status Achieved             OT Long Term Goals - 09/29/19 1105      OT LONG TERM GOAL #1   Title Patient will complete HEP/Home activities program to improve functional mobility and use of left hand due 9/21    Time 8    Period Weeks    Status On-going      OT LONG TERM GOAL #2   Title Patient will bathe and dress himself with modified independence  - with increased time    Time 8    Period Weeks    Status Partially Met   pt requires set up and supervision for bathing for safety     OT LONG TERM GOAL #3   Title Patient will prepare a familiar hot meal using left hand as non dominant with supervision    Time 8    Period Weeks    Status On-going   done once at home     OT LONG TERM GOAL #4   Title Patient will demonstrate ability to manipulate small items within left hand as evidenced by his ability to complete 9 hole peg test in less than 1 minute    Time 8    Period Weeks    Status Achieved   34.69 sec     OT LONG TERM GOAL #5   Title Patient will understand recommendations related to driving    Time 8    Period Weeks    Status New                 Plan - 09/29/19 1107    Clinical Impression Statement Pt progressing and has met all STG's and some LTG's at this time. Pt has fluid on Lt lung and seeing cardiologist for ECHO tomorrow.    Occupational performance deficits (Please refer to evaluation for details): ADL's;IADL's;Leisure;Rest and Sleep    Body Structure / Function / Physical Skills ADL;Coordination;Endurance;GMC;UE functional use;Sensation;Decreased knowledge of precautions;Balance;Body mechanics;Decreased knowledge of use of DME;Flexibility;IADL;Pain;Strength;FMC;Dexterity;Cardiopulmonary status limiting activity;ROM;Mobility;Tone    Cognitive Skills Attention;Thought;Problem Solve;Safety Awareness;Memory;Energy/Drive    OT Frequency 2x / week    OT Duration 8 weeks    OT Treatment/Interventions Self-care/ADL training;Electrical Stimulation;Iontophoresis;Therapeutic exercise;Moist Heat;Paraffin;Neuromuscular education;Splinting;Patient/family education;Balance training;Therapeutic activities;Functional Mobility Training;Fluidtherapy;Cryotherapy;Ultrasound;DME and/or AE instruction;Manual Therapy;Passive range of motion;Cognitive remediation/compensation    Plan 10th progress note, cooking task    Consulted and  Agree with Plan of Care Patient;Family member/caregiver           Patient will benefit from skilled therapeutic intervention in order to improve the following deficits and impairments:   Body Structure / Function / Physical Skills: ADL, Coordination, Endurance, GMC, UE functional use, Sensation,  Decreased knowledge of precautions, Balance, Body mechanics, Decreased knowledge of use of DME, Flexibility, IADL, Pain, Strength, FMC, Dexterity, Cardiopulmonary status limiting activity, ROM, Mobility, Tone Cognitive Skills: Attention, Thought, Problem Solve, Safety Awareness, Memory, Energy/Drive     Visit Diagnosis: Stiffness of left hand, not elsewhere classified  Pain in left hand  Muscle weakness (generalized)    Problem List Patient Active Problem List   Diagnosis Date Noted  . Fracture of multiple ribs with pain 08/10/2019  . Clavicle fracture 08/10/2019  . Closed displaced fracture of phalanx of left thumb, sequela 08/10/2019  . TBI (traumatic brain injury) ([place]) 07/23/2019  . Traumatic closed fracture of distal clavicle with minimal displacement, left, initial encounter 07/19/2019  . Traumatic brain injury with loss of consciousness ([place])   . Multiple trauma   . Benign prostatic hyperplasia   . Essential hypertension   . AKI (acute kidney injury) ([place])   . Stage 3b chronic kidney disease   . Prediabetes   . Acute blood loss anemia   . Thrombocytopenia ([place])   . Fall 07/14/2019  . Type 2 diabetes mellitus ([place])   . Eosinophilia   . Need for prophylactic vaccination and inoculation against influenza 11/12/2013  . Nocturnal leg cramps 11/23/2012  . Pain in joint, shoulder region 11/23/2012  . Fall 11/04/2012  . Hematoma 11/04/2012  . Acute upper respiratory infections of unspecified site 11/04/2012  . HYPERLIPIDEMIA TYPE IIB / III 03/08/2008  . Orthostatic hypotension 03/08/2008  . CAD, NATIVE VESSEL 03/08/2008    Carey Bullocks, OTR/L 09/29/2019, 11:09  AM  [facility] [address] [place], [place], [address] Phone: [phone]   Fax:  [phone]  Name: CHANDON LAZCANO MRN: [id number] Date of Birth: 1933/12/24

## 2019-09-30 ENCOUNTER — Ambulatory Visit: Payer: PPO | Admitting: Cardiovascular Disease

## 2019-09-30 ENCOUNTER — Other Ambulatory Visit: Payer: PPO

## 2019-09-30 ENCOUNTER — Encounter: Payer: Self-pay | Admitting: Cardiovascular Disease

## 2019-09-30 VITALS — BP 128/72 | HR 90 | Ht 67.0 in | Wt 185.4 lb

## 2019-09-30 DIAGNOSIS — R0602 Shortness of breath: Secondary | ICD-10-CM

## 2019-09-30 DIAGNOSIS — N183 Chronic kidney disease, stage 3 unspecified: Secondary | ICD-10-CM

## 2019-09-30 DIAGNOSIS — I6523 Occlusion and stenosis of bilateral carotid arteries: Secondary | ICD-10-CM

## 2019-09-30 DIAGNOSIS — E782 Mixed hyperlipidemia: Secondary | ICD-10-CM

## 2019-09-30 DIAGNOSIS — I10 Essential (primary) hypertension: Secondary | ICD-10-CM

## 2019-09-30 DIAGNOSIS — I2510 Atherosclerotic heart disease of native coronary artery without angina pectoris: Secondary | ICD-10-CM | POA: Diagnosis not present

## 2019-09-30 DIAGNOSIS — J90 Pleural effusion, not elsewhere classified: Secondary | ICD-10-CM | POA: Diagnosis not present

## 2019-09-30 NOTE — Progress Notes (Signed)
Cardiology Office Note:    Date:  09/30/2019   ID:  [name], DOB 09-11-1933, MRN [id number]  PCP:  [name], MD  [facility] Cardiologist:  Sherren Mocha, MD  [place] Electrophysiologist:  None   Referring MD: [name], MD   Chief Complaint  Patient presents with  . Shortness of Breath    History of Present Illness:    [name] is a 86 y.o. male with a hx of coronary artery disease with history of LAD stenting in 2008. Comorbid conditions include mild bilateral carotid artery stenosis, hypertension, hyperlipidemia, and stage III chronic kidney disease.  Patient underwent PCI with DES to proximal LAD in 2008 with a Cypher DES. He was also noted to have moderate residual coronary disease at that time for which medical therapy was recommended. He has done well since then with no recurrent ischemic events. He did have an exercise treadmill test in 2014 which demonstrated no significant ST/T changes. Patient also has known mild bilateral carotid artery stenosis with most recent carotid dopplers on 11/13/2018 showing 1-39% stenosis of bilateral ICAs.  The patient is here with his wife today. He has been diagnosed with a left pleural effusion. He is short of breath when he wakes up in the morning. He is also having shortness of breath with activity. He denies orthopnea, PND, or edema. No palpitations. The patient had a mechanical fall in June 2020 and sustained a small subdural hematoma, a fractured left clavicle, and left sided rib fractures. He had a small left pneumothorax and now has an enlarging left pleural effusion. He feels as if his shortness of breath has progressed over the last 3-4 weeks. This has limited his ability to participate in physical therapy.   Past Medical History:  Diagnosis Date  . Allergy   . BPH (benign prostatic hyperplasia)   . BPH (benign prostatic hypertrophy)   . CAD (coronary artery disease)    s/p cypher DES to  pLAD 6/08; normal LVF;  ETT-Myoview 2009: no ischemia   . Coronary artery disease   . Eosinophilia   . High cholesterol   . HTN (hypertension)   . Hyperlipidemia   . Hypertension   . MI (myocardial infarction) ([place])   . Myocardial infarction ([place])   . Prediabetes   . Trigeminal neuralgia     Past Surgical History:  Procedure Laterality Date  . APPENDECTOMY    . CARDIAC CATHETERIZATION  07/30/2006   CORONARY ANGIOPLASTY WITH STENT PLACEMENT  . CARDIAC CATHETERIZATION    . EXPLORATORY LAPAROTOMY     age 28  . EXPLORATORY LAPAROTOMY      Current Medications: Current Meds  Medication Sig  . acetaminophen (TYLENOL) 500 MG tablet Take 1,000 mg by mouth every 6 (six) hours as needed for moderate pain.  Marland Kitchen albuterol (PROAIR HFA) 108 (90 Base) MCG/ACT inhaler INHALE 2 PUFFS INTO THE LUNGS EVERY 6 HOURS AS NEEDED FOR WHEEZING OR SHORTNESS OF BREATH  . atorvastatin (LIPITOR) 40 MG tablet Take 40 mg by mouth daily.  . Calcium-Vitamin D 600-200 MG-UNIT tablet Take 1 tablet by mouth 2 (two) times daily. (Patient taking differently: Take 1 tablet by mouth daily. )  . doxazosin (CARDURA) 2 MG tablet Take 1 tablet (2 mg total) by mouth daily.  Marland Kitchen escitalopram (LEXAPRO) 10 MG tablet TAKE 1 TABLET BY MOUTH EVERY DAY  . ezetimibe (ZETIA) 10 MG tablet Take 1 tablet (10 mg total) by mouth daily. Please make yearly appt with  Dr. [name] for October for future refills. 1st attempt  . fluticasone furoate-vilanterol (BREO ELLIPTA) 200-25 MCG/INH AEPB Inhale 1 puff into the lungs daily.  . furosemide (LASIX) 20 MG tablet Take 1 tablet (20 mg total) by mouth daily.  . Multiple Vitamin (MULTIVITAMIN WITH MINERALS) TABS tablet Take 1 tablet by mouth daily.  . polyethylene glycol (MIRALAX / GLYCOLAX) 17 g packet Take 17 g by mouth daily.  . [DISCONTINUED] melatonin 3 MG TABS tablet Take 1 tablet (3 mg total) by mouth at bedtime.     Allergies:   Gabapentin   Social History   Socioeconomic History  .  Marital status: Married    Spouse name: [name]  . Number of children: 1  . Years of education: Not on file  . Highest education level: Not on file  Occupational History  . Occupation: retired professor    Comment: History   . Occupation: missionary  Tobacco Use  . Smoking status: Never Smoker  . Smokeless tobacco: Never Used  . Tobacco comment: quit over 70yrs ago.  Substance and Sexual Activity  . Alcohol use: Never  . Drug use: Never  . Sexual activity: Yes    Partners: Female  Other Topics Concern  . Not on file  Social History Narrative   ** Merged History Encounter **       Lives with his wife (second marriage in 2015, first marriage ended when his wife died alzheimer's disease). Since his remarriage, his adult daughter doesn't speak with him.   Social Determinants of Health   Financial Resource Strain:   . Difficulty of Paying Living Expenses: Not on file  Food Insecurity:   . Worried About Charity fundraiser in the Last Year: Not on file  . Ran Out of Food in the Last Year: Not on file  Transportation Needs:   . Lack of Transportation (Medical): Not on file  . Lack of Transportation (Non-Medical): Not on file  Physical Activity:   . Days of Exercise per Week: Not on file  . Minutes of Exercise per Session: Not on file  Stress:   . Feeling of Stress : Not on file  Social Connections:   . Frequency of Communication with Friends and Family: Not on file  . Frequency of Social Gatherings with Friends and Family: Not on file  . Attends Religious Services: Not on file  . Active Member of Clubs or Organizations: Not on file  . Attends Archivist Meetings: Not on file  . Marital Status: Not on file     Family History: The patient's family history includes Aortic aneurysm in his brother and brother; Arthritis in his daughter; Colon cancer in his brother and brother; Coronary artery disease in his brother; Coronary artery disease (age of onset: 56) in his  father; Heart disease in his brother and father; Stomach cancer in his mother. There is no history of Esophageal cancer or Rectal cancer.  ROS:   Please see the history of present illness.    All other systems reviewed and are negative.  EKGs/Labs/Other Studies Reviewed:    The following studies were reviewed today: CXR: FINDINGS: Frontal and lateral of the views of the chest demonstrate interval enlargement left pleural effusion, now with opacification of the lower half of the left hemithorax. There is underlying left lung consolidation. Right chest is clear. No pneumothorax. Cardiac silhouette is stable. No acute bony abnormalities.  IMPRESSION: 1. Increasing left basilar consolidation and effusion.  CT Chest 07/14/2019: IMPRESSION:  1. Trace left-sided pneumothorax, with punctate foci of gas seen in the pleural space adjacent to multiple left-sided rib fractures. Volume estimated far less than 5%. 2. Trace left pleural effusion. 3. Minimally displaced left posterior first through seventh rib fractures. 4. Minimal left upper lobe contusion. 5.  Aortic Atherosclerosis (ICD10-I70.0).  EKG:  EKG is not ordered today.    Recent Labs: 08/20/2019: ALT 53 08/25/2019: BUN 23; Creat 1.51; Potassium 4.8; Sodium 137 09/04/2019: Hemoglobin 11.1; Platelets 297  Recent Lipid Panel    Component Value Date/Time   CHOL 186 07/29/2019 1340   CHOL 129 04/26/2019 0730   TRIG 116 07/29/2019 1340   HDL 48 07/29/2019 1340   HDL 58 04/26/2019 0730   CHOLHDL 3.9 07/29/2019 1340   VLDL 23 07/29/2019 1340   LDLCALC 115 (H) 07/29/2019 1340   LDLCALC 58 04/26/2019 0730   LDLCALC 92 04/07/2018 0827    Physical Exam:    VS:  BP 128/72   Pulse 90   Ht 5\' 7"  (1.702 m)   Wt 185 lb 6.4 oz (84.1 kg)   SpO2 96%   BMI 29.04 kg/m     Wt Readings from Last 3 Encounters:  09/30/19 185 lb 6.4 oz (84.1 kg)  09/23/19 186 lb (84.4 kg)  09/04/19 188 lb (85.3 kg)     GEN:  Well nourished, well  developed in no acute distress HEENT: Normal NECK: No JVD; No carotid bruits LYMPHATICS: No lymphadenopathy CARDIAC: RRR, no murmurs, rubs, gallops RESPIRATORY: Decreased breath sounds on the left lower lung, otherwise clear ABDOMEN: Soft, non-tender, non-distended MUSCULOSKELETAL:  No edema; No deformity  SKIN: Warm and dry NEUROLOGIC:  Alert and oriented x 3 PSYCHIATRIC:  Normal affect   ASSESSMENT:    1. Coronary artery disease involving native coronary artery of native heart without angina pectoris   2. Bilateral carotid artery stenosis   3. Mixed hyperlipidemia   4. Essential hypertension   5. Stage 3 chronic kidney disease, unspecified whether stage 3a or 3b CKD   6. Shortness of breath   7. Pleural effusion    PLAN:    In order of problems listed above:  1. The patient is stable without symptoms of angina.  He will continue on his current medical program.  His medications have been reduced because of low blood pressure. 2. No stroke or TIA symptoms.  No bruits on exam.  Continue clinical follow-up. 3. Patient is treated with atorvastatin 40 mg daily. 4. Blood pressure controlled now off of multiple medicines. 5. Labs reviewed, recent creatinine 1.51 mg/dL is stable. 6. I reviewed the patient's chest x-ray studies.  I am impressed how much is left pleural effusion has increased in size over the last month.  I suspect this is the reason he is short of breath.  Will check an echocardiogram to rule out cardiac etiology.  However, I suspect the development of pleural effusion is related possibly to an inflammatory process after his rib fractures and small pneumothorax.  Will refer him to interventional radiology for diagnostic and therapeutic thoracentesis.  Discussed risks, indications, and alternatives with the patient and his wife today.   Medication Adjustments/Labs and Tests Ordered: Current medicines are reviewed at length with the patient today.  Concerns regarding  medicines are outlined above.  Orders Placed This Encounter  Procedures  . IR THORACENTESIS ASP PLEURAL SPACE W/IMG GUIDE  . ECHOCARDIOGRAM COMPLETE   No orders of the defined types were placed in this encounter.   Patient Instructions  Medication Instructions:  Your provider recommends that you continue on your current medications as directed. Please refer to the Current Medication list given to you today.   *If you need a refill on your cardiac medications before your next appointment, please call your pharmacy*  Testing/Procedures: Your provider has requested that you have an echocardiogram. Echocardiography is a painless test that uses sound waves to create images of your heart. It provides your doctor with information about the size and shape of your heart and how well your heart's chambers and valves are working. This procedure takes approximately one hour. There are no restrictions for this procedure. You are scheduled 10/16/2019 at 4:05PM. Please arrive 15 minutes prior for check-in.   Covid screening information: You are scheduled for your drive-thru COVID screening on Saturday, October 03, 2019. Pre-Procedural COVID-19 Testing Site [phone] [place], [place] [address] You will need to go home after your screening and quarantine until your procedure.  You are scheduled for a thoracentesis on Monday, October 05, 2019. Please arrive at the [place] [facility] by 1:45PM (for procedure at 2:00PM). There are no restrictions for this test.  Follow-Up: You are scheduled 01/05/2020 at 2:45PM with Dr. Antionette Char assistant, Richardson Dopp.    Signed, Sherren Mocha, MD  09/30/2019 4:37 PM    [place]

## 2019-09-30 NOTE — Therapy (Signed)
[facility] [address] [place], [place], [address] Phone: [phone]   Fax:  [phone]  Speech Language Pathology Treatment  Patient Details  Name: [name] MRN: [id number] Date of Birth: Jan 12, 1934 Referring Provider (SLP): Reesa Chew, PA-C   Encounter Date: 09/29/2019    Past Medical History:  Diagnosis Date  . Allergy   . BPH (benign prostatic hyperplasia)   . BPH (benign prostatic hypertrophy)   . CAD (coronary artery disease)    s/p cypher DES to pLAD 6/08; normal LVF;  ETT-Myoview 2009: no ischemia   . Coronary artery disease   . Eosinophilia   . High cholesterol   . HTN (hypertension)   . Hyperlipidemia   . Hypertension   . MI (myocardial infarction) ([place])   . Myocardial infarction ([place])   . Prediabetes   . Trigeminal neuralgia     Past Surgical History:  Procedure Laterality Date  . APPENDECTOMY    . CARDIAC CATHETERIZATION  07/30/2006   CORONARY ANGIOPLASTY WITH STENT PLACEMENT  . CARDIAC CATHETERIZATION    . EXPLORATORY LAPAROTOMY     age 86  . EXPLORATORY LAPAROTOMY      There were no vitals filed for this visit.   Subjective Assessment - 09/29/19 1111    Subjective "Where would you like me to sit?" Pt got out homework spontaneously and told SLP he was not here last time (thursday) and reason why.    Patient is accompained by: Family member   wife   Currently in Pain? No/denies                 ADULT SLP TREATMENT - 09/29/19 1113      General Information   Behavior/Cognition Alert;Cooperative;Pleasant mood      Treatment Provided   Treatment provided Cognitive-Linquistic      Cognitive-Linquistic Treatment   Treatment focused on Cognition;Patient/family/caregiver education    Skilled Treatment Pt opened notebook and gave homework to SLP. Pt wife asked for more weekly schedule sheets so SLP made copies and worked with pt on attention skills filling out schedule  sheet for this week. Pt req'd min A occasionally for details, and extra time and rare min A for alternating attention- occasional cueing as the session progressed likely due to mental fatigue.       Assessment / Recommendations / Plan   Plan Continue with current plan of care      Progression Toward Goals   Progression toward goals Progressing toward goals              SLP Short Term Goals - 09/22/19 1539      SLP SHORT TERM GOAL #1   Title pt will demo knowledge of his memory copmensation system by attempt to use with rare min cues in 2 sessions    Baseline 09-16-19    Status Partially Met      SLP SHORT TERM GOAL #2   Title pt will demo correct/successful orientation to a memory compensation system in 3 sessions    Status Not Met      SLP SHORT TERM GOAL #3   Title pt will tell SLP 2 cognitive deficits independently, and how they may impact him at home with rare min A in 3 sessions    Status Not Met      SLP SHORT TERM GOAL #4   Title pt will undergo further cognitive linguisitic testing PRN    Period --   or 7 total sessions  Status Achieved      SLP SHORT TERM GOAL #5   Title Patient will use compensations for attention during functional task (eg conversation or reading on topic of interest) with min cues x 2 sessions.    Status Not Met            SLP Long Term Goals - 09/22/19 1539      SLP LONG TERM GOAL #1   Title pt will demo correct usage of a memory compensation system for appointment and/or medication management, to-do lists, daily schedules in 3 sessions    Time 4    Period Weeks   or 17 total sessions, for all LTGs   Status On-going      SLP LONG TERM GOAL #2   Title pt will demo anticipatory awareness regarding deficit areas, independently, in 3 sessions    Time 4    Period Weeks    Status On-going      SLP LONG TERM GOAL #3   Title Pt will maintain selective attention in functional task for 8 minutes in mod noisy environment with rare min cues x  2 sessions.    Time 4    Period Weeks    Status New             Patient will benefit from skilled therapeutic intervention in order to improve the following deficits and impairments:   No diagnosis found.    Problem List Patient Active Problem List   Diagnosis Date Noted  . Fracture of multiple ribs with pain 08/10/2019  . Clavicle fracture 08/10/2019  . Closed displaced fracture of phalanx of left thumb, sequela 08/10/2019  . TBI (traumatic brain injury) ([place]) 07/23/2019  . Traumatic closed fracture of distal clavicle with minimal displacement, left, initial encounter 07/19/2019  . Traumatic brain injury with loss of consciousness ([place])   . Multiple trauma   . Benign prostatic hyperplasia   . Essential hypertension   . AKI (acute kidney injury) ([place])   . Stage 3b chronic kidney disease   . Prediabetes   . Acute blood loss anemia   . Thrombocytopenia ([place])   . Fall 07/14/2019  . Type 2 diabetes mellitus ([place])   . Eosinophilia   . Need for prophylactic vaccination and inoculation against influenza 11/12/2013  . Nocturnal leg cramps 11/23/2012  . Pain in joint, shoulder region 11/23/2012  . Fall 11/04/2012  . Hematoma 11/04/2012  . Acute upper respiratory infections of unspecified site 11/04/2012  . HYPERLIPIDEMIA TYPE IIB / III 03/08/2008  . Orthostatic hypotension 03/08/2008  . CAD, NATIVE VESSEL 03/08/2008    [facility] ,MS, [place]  09/30/2019, 2:36 PM  [facility] [address] [place], [place], [address] Phone: [phone]   Fax:  [phone]   Name: [name] MRN: [id number] Date of Birth: Oct 27, 1933

## 2019-09-30 NOTE — Patient Instructions (Addendum)
Medication Instructions:  Your provider recommends that you continue on your current medications as directed. Please refer to the Current Medication list given to you today.   *If you need a refill on your cardiac medications before your next appointment, please call your pharmacy*  Testing/Procedures: Your provider has requested that you have an echocardiogram. Echocardiography is a painless test that uses sound waves to create images of your heart. It provides your doctor with information about the size and shape of your heart and how well your heart's chambers and valves are working. This procedure takes approximately one hour. There are no restrictions for this procedure. You are scheduled 10/16/2019 at 4:05PM. Please arrive 15 minutes prior for check-in.   Covid screening information: You are scheduled for your drive-thru COVID screening on Saturday, October 03, 2019. Pre-Procedural COVID-19 Testing Site [phone] [place], [place] [address] You will need to go home after your screening and quarantine until your procedure.  You are scheduled for a thoracentesis on Monday, October 05, 2019. Please arrive at the [place] [facility] by 1:45PM (for procedure at 2:00PM). There are no restrictions for this test.  Follow-Up: You are scheduled 01/05/2020 at 2:45PM with Dr. Antionette Char assistant, Richardson Dopp.

## 2019-10-01 ENCOUNTER — Ambulatory Visit: Payer: PPO | Admitting: Physical Therapy

## 2019-10-01 ENCOUNTER — Ambulatory Visit: Payer: PPO

## 2019-10-01 ENCOUNTER — Ambulatory Visit: Payer: PPO | Admitting: Occupational Therapy

## 2019-10-02 ENCOUNTER — Telehealth: Payer: Self-pay | Admitting: Cardiovascular Disease

## 2019-10-02 NOTE — Telephone Encounter (Signed)
Per Dr. [name], instructed the patient's wife to increase Lasix to 40 mg over the weekend and then decrease back to 20 mg daily next week to see if that helps. She was grateful for assistance.

## 2019-10-02 NOTE — Telephone Encounter (Signed)
Pt c/o swelling: STAT is pt has developed SOB within 24 hours  1) How much weight have you gained and in what time span? Not sure. Wife only knows what pr weighed at visit with Dr. Doyne Keel  2) If swelling, where is the swelling located? Abdomen area  3) Are you currently taking a fluid pill? yes  4) Are you currently SOB? Yes. Pt wakes up SOB but it gets better as the day goes on  5) Do you have a log of your daily weights (if so, list)? no  6) Have you gained 3 pounds in a day or 5 pounds in a week? Not sure.   7) Have you traveled recently? No  Wife called. Yesterday the patient's shirt looked more snug than usual. When the patient woke up this morning his shirt was even tighter. She also noticed that the therapy belt she uses to help him is tighter than normal. She wanted to know if this was a concern because he is scheduled for a procedure to drain the fluid from his lungs on Monday afternoon

## 2019-10-03 ENCOUNTER — Other Ambulatory Visit (HOSPITAL_COMMUNITY)
Admission: RE | Admit: 2019-10-03 | Discharge: 2019-10-03 | Disposition: A | Discharge status: Home / Self Care | Payer: PPO | Source: Ambulatory Visit | Attending: Cardiovascular Disease | Admitting: Cardiovascular Disease

## 2019-10-03 DIAGNOSIS — Z20822 Contact with and (suspected) exposure to covid-19: Secondary | ICD-10-CM | POA: Insufficient documentation

## 2019-10-03 DIAGNOSIS — Z01812 Encounter for preprocedural laboratory examination: Secondary | ICD-10-CM | POA: Insufficient documentation

## 2019-10-03 LAB — SARS CORONAVIRUS 2 (TAT 6-24 HRS): SARS Coronavirus 2: NEGATIVE

## 2019-10-05 ENCOUNTER — Ambulatory Visit (HOSPITAL_COMMUNITY)
Admission: RE | Admit: 2019-10-05 | Discharge: 2019-10-05 | Disposition: A | Discharge status: Home / Self Care | Payer: PPO | Source: Ambulatory Visit | Attending: Radiology | Admitting: Radiology

## 2019-10-05 ENCOUNTER — Other Ambulatory Visit (HOSPITAL_COMMUNITY): Payer: Self-pay | Admitting: Radiology

## 2019-10-05 ENCOUNTER — Ambulatory Visit (HOSPITAL_COMMUNITY)
Admission: RE | Admit: 2019-10-05 | Discharge: 2019-10-05 | Disposition: A | Discharge status: Home / Self Care | Payer: PPO | Source: Ambulatory Visit | Attending: Cardiovascular Disease | Admitting: Cardiovascular Disease

## 2019-10-05 ENCOUNTER — Other Ambulatory Visit: Payer: Self-pay

## 2019-10-05 ENCOUNTER — Other Ambulatory Visit (HOSPITAL_COMMUNITY): Payer: Self-pay | Admitting: Cardiovascular Disease

## 2019-10-05 DIAGNOSIS — J90 Pleural effusion, not elsewhere classified: Secondary | ICD-10-CM | POA: Insufficient documentation

## 2019-10-05 DIAGNOSIS — J948 Other specified pleural conditions: Secondary | ICD-10-CM | POA: Diagnosis not present

## 2019-10-05 HISTORY — PX: IR THORACENTESIS ASP PLEURAL SPACE W/IMG GUIDE: IMG5380

## 2019-10-05 LAB — GRAM STAIN: Gram Stain: NONE SEEN

## 2019-10-05 LAB — BODY FLUID CELL COUNT WITH DIFFERENTIAL
Lymphs, Fluid: 62 %
Monocyte-Macrophage-Serous Fluid: 37 % — ABNORMAL LOW (ref 50–90)
Neutrophil Count, Fluid: 1 % (ref 0–25)
Total Nucleated Cell Count, Fluid: 325 cu mm (ref 0–1000)

## 2019-10-05 LAB — GLUCOSE, PLEURAL OR PERITONEAL FLUID: Glucose, Fluid: 131 mg/dL

## 2019-10-05 LAB — ALBUMIN, PLEURAL OR PERITONEAL FLUID: Albumin, Fluid: 2.8 g/dL

## 2019-10-05 LAB — PROTEIN, PLEURAL OR PERITONEAL FLUID: Total protein, fluid: 4.3 g/dL

## 2019-10-05 LAB — LACTATE DEHYDROGENASE, PLEURAL OR PERITONEAL FLUID: LD, Fluid: 89 U/L — ABNORMAL HIGH (ref 3–23)

## 2019-10-05 IMAGING — US IR THORACENTESIS ASP PLEURAL SPACE W/IMG GUIDE
1 series · 3 of 3 positions shown · non-contrast
Comparison: none

INDICATION: Patient with history of CAD hypertension hyperlipidemia and chronic
kidney disease stage 3 with history of mechanical fall in [DATE] with small hemothorax and fractured ribs. Patient is found to
have a left-sided pleural effusion. Patient presents for therapeutic
and diagnostic thoracentesis

[Series 1: ir (id) (id)/(id)/(id) ir · 3 of 3 slices shown]
[im 1/3]
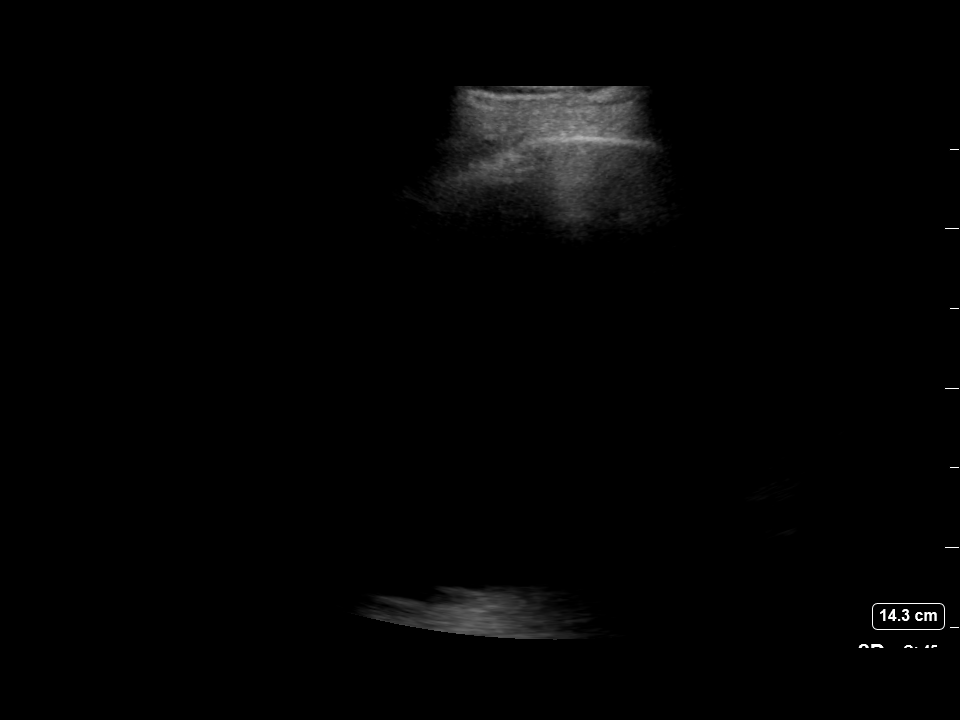
[im 2/3]
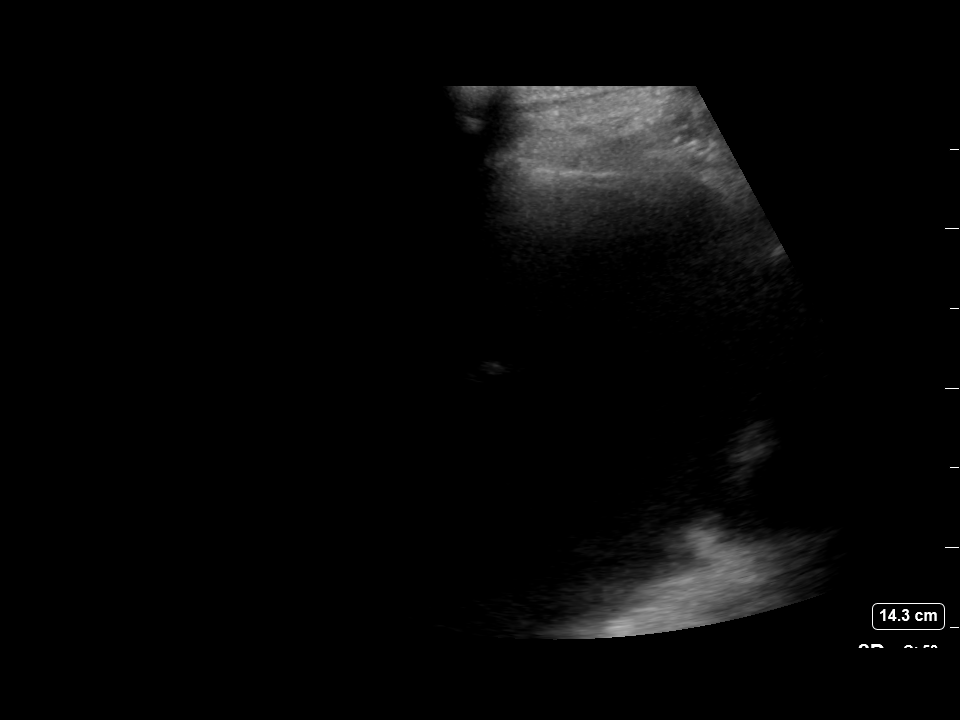
[im 3/3]
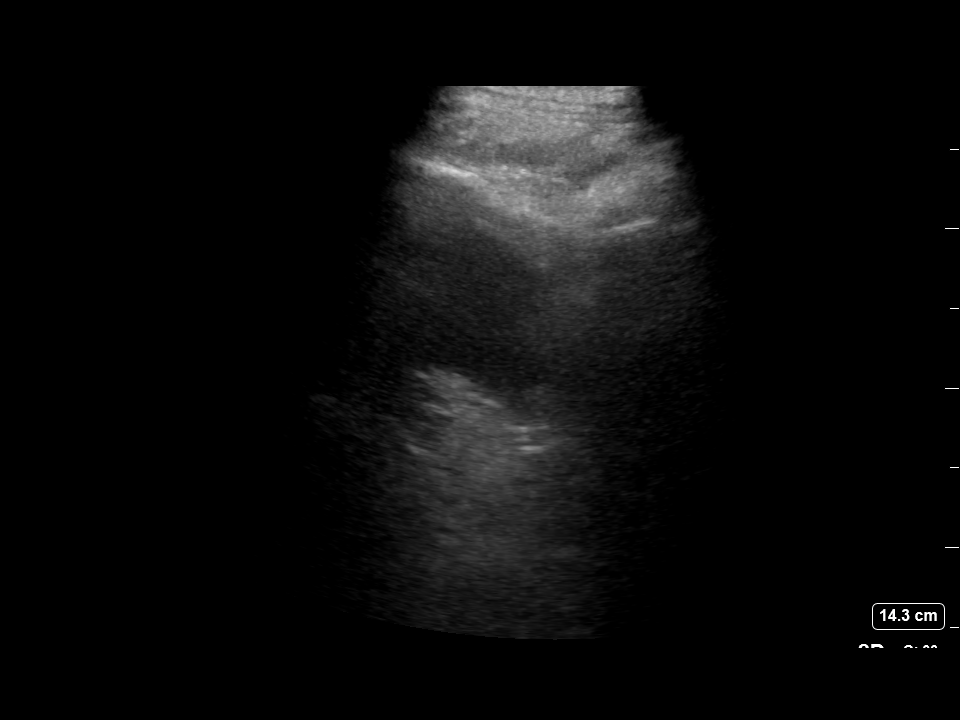

[3 of 3 positions shown; findings below may reference images not displayed]

EXAM:
ULTRASOUND GUIDED THERAPEUTIC AND DIAGNOSTIC THORACENTESIS

MEDICATIONS:
Lidocaine 1% 10 mL

COMPLICATIONS:
None immediate.

PROCEDURE:
An ultrasound guided thoracentesis was thoroughly discussed with the
patient and questions answered. The benefits, risks, alternatives
and complications were also discussed. The patient understands and
wishes to proceed with the procedure. Written consent was obtained.

Ultrasound was performed to localize and mark an adequate pocket of
fluid in the left chest. The area was then prepped and draped in the
normal sterile fashion. 1% Lidocaine was used for local anesthesia.
Under ultrasound guidance a 6 Fr Safe-T-Centesis catheter was
introduced. Thoracentesis was performed. The catheter was removed
and a dressing applied.
FINDINGS: A total of approximately 1.6 L of straw-colored fluid was removed.
Samples were sent to the laboratory as requested by the clinical
team.
IMPRESSION: Successful ultrasound guided therapeutic and diagnostic left-sided
thoracentesis yielding 1.6 L of pleural fluid.

Read by: SUIMING, NP

## 2019-10-05 IMAGING — CR DG CHEST 1V
1 series · 1 of 1 positions shown · non-contrast
Comparison: [DATE].

CLINICAL DATA: Status post thoracentesis.

EXAM:
CHEST  1 VIEW

[chest pa]
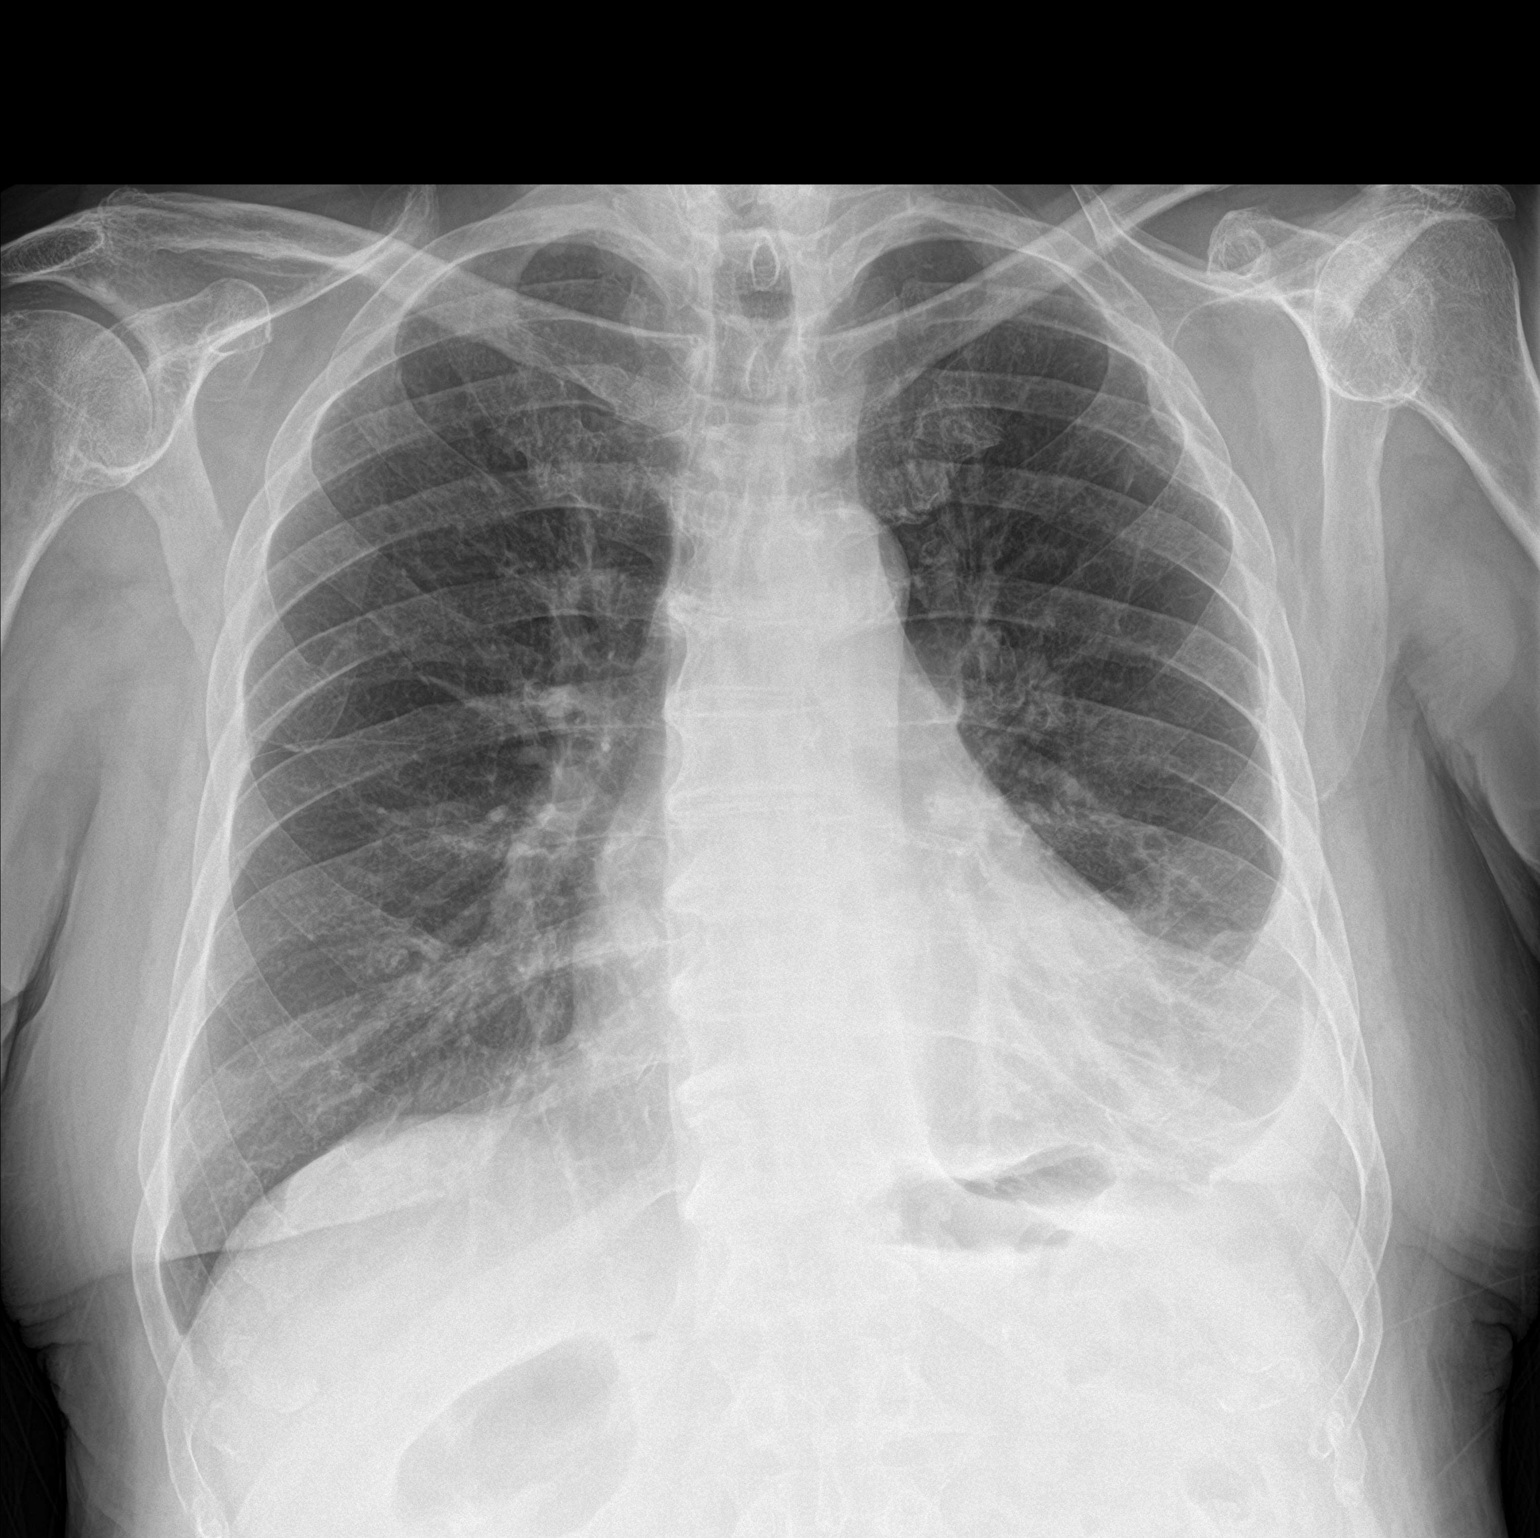

[1 of 1 positions shown; findings below may reference images not displayed]

FINDINGS: Stable cardiomediastinal silhouette. Left pleural effusion is
significantly smaller status post thoracentesis. No pneumothorax is
noted. Right lung is clear. Bony thorax is unremarkable.
IMPRESSION: Left pleural effusion is significantly smaller status post
thoracentesis. No pneumothorax is noted.

## 2019-10-05 MED ORDER — LIDOCAINE HCL 1 % IJ SOLN
INTRAMUSCULAR | Status: DC | PRN
  Administered 2019-10-05: 10 mL

## 2019-10-05 MED ORDER — LIDOCAINE HCL 1 % IJ SOLN
INTRAMUSCULAR | Status: AC
  Filled 2019-10-05: qty 20

## 2019-10-05 NOTE — Progress Notes (Cosign Needed)
Ultrasound-guided diagnostic and therapeutic left sided thoracentesis performed yielding 1.6 liters of straw colored fluid. No immediate complications.   Diagnostic fluid was sent to the lab for further analysis. Follow-up chest x-ray pending. EBL is < 2 ml.

## 2019-10-06 ENCOUNTER — Ambulatory Visit: Payer: PPO | Admitting: Occupational Therapy

## 2019-10-06 ENCOUNTER — Ambulatory Visit: Payer: PPO

## 2019-10-06 ENCOUNTER — Ambulatory Visit: Payer: PPO | Admitting: Physical Therapy

## 2019-10-06 ENCOUNTER — Encounter: Payer: Self-pay | Admitting: Occupational Therapy

## 2019-10-06 VITALS — HR 93

## 2019-10-06 DIAGNOSIS — R29818 Other symptoms and signs involving the nervous system: Secondary | ICD-10-CM

## 2019-10-06 DIAGNOSIS — R2681 Unsteadiness on feet: Secondary | ICD-10-CM

## 2019-10-06 DIAGNOSIS — R2689 Other abnormalities of gait and mobility: Secondary | ICD-10-CM

## 2019-10-06 DIAGNOSIS — M79642 Pain in left hand: Secondary | ICD-10-CM

## 2019-10-06 DIAGNOSIS — M6281 Muscle weakness (generalized): Secondary | ICD-10-CM

## 2019-10-06 DIAGNOSIS — M25642 Stiffness of left hand, not elsewhere classified: Secondary | ICD-10-CM

## 2019-10-06 DIAGNOSIS — R41840 Attention and concentration deficit: Secondary | ICD-10-CM

## 2019-10-06 DIAGNOSIS — R262 Difficulty in walking, not elsewhere classified: Secondary | ICD-10-CM

## 2019-10-06 DIAGNOSIS — R41841 Cognitive communication deficit: Secondary | ICD-10-CM

## 2019-10-06 DIAGNOSIS — M25632 Stiffness of left wrist, not elsewhere classified: Secondary | ICD-10-CM

## 2019-10-06 LAB — CYTOLOGY - NON PAP

## 2019-10-06 NOTE — Therapy (Signed)
[facility] [address] [place], [place], [address] Phone: [phone]   Fax:  [phone]  Physical Therapy Treatment  Patient Details  Name: [name] MRN: [id number] Date of Birth: 01-25-1934 Referring Provider (PT): Reesa Chew PA-C   Encounter Date: 10/06/2019   PT End of Session - 10/06/19 1245    Visit Number 11    Number of Visits 17    Date for PT Re-Evaluation 11/12/19   written for 60 day POC   Authorization Type [employer] P[employer] - $15 co pay, collect 1 co pay per day for multiple disciplines    PT Start Time 0932    PT Stop Time 1014    PT Time Calculation (min) 42 min    Equipment Utilized During Treatment Gait belt    Activity Tolerance Patient tolerated treatment well    Behavior During Therapy [facility] for tasks assessed/performed;Impulsive           Past Medical History:  Diagnosis Date  . Allergy   . BPH (benign prostatic hyperplasia)   . BPH (benign prostatic hypertrophy)   . CAD (coronary artery disease)    s/p cypher DES to pLAD 6/08; normal LVF;  ETT-Myoview 2009: no ischemia   . Coronary artery disease   . Eosinophilia   . High cholesterol   . HTN (hypertension)   . Hyperlipidemia   . Hypertension   . MI (myocardial infarction) ([place])   . Myocardial infarction ([place])   . Prediabetes   . Trigeminal neuralgia     Past Surgical History:  Procedure Laterality Date  . APPENDECTOMY    . CARDIAC CATHETERIZATION  07/30/2006   CORONARY ANGIOPLASTY WITH STENT PLACEMENT  . CARDIAC CATHETERIZATION    . EXPLORATORY LAPAROTOMY     age 11  . EXPLORATORY LAPAROTOMY    . IR THORACENTESIS ASP PLEURAL SPACE W/IMG GUIDE  10/05/2019    Vitals:   10/06/19 0941  Pulse: 93  SpO2: 95%     Subjective Assessment - 10/06/19 0935    Subjective Had a thoracocentesis done yesterday due to a L pleural effusion. . Comes with return orders to PT. Still hurts to breathe in. No falls. No  dizzy spells since having his thoracocentesis.    Patient is accompained by: Family member   spouse   Pertinent History PMH: BPH, CAD, HTN, CKD III, prediabetes, mild cognitive impairment.    Patient Stated Goals wants to walk without worrying about potentially having a dizzy spell - reports 4-5 spells a day that lasts only a couple of seconds.    Currently in Pain? Yes   "when i breathe in - normal breath is a 2/10, deeper breath is a 5/10"   Pain Location Scapula    Pain Orientation Left    Pain Type Acute pain                             [facility] Adult PT Treatment/Exercise - 10/06/19 0001      Ambulation/Gait   Ambulation/Gait Yes    Ambulation/Gait Assistance 5: Supervision    Ambulation/Gait Assistance Details in/out of gym and around session with large base quad cane    Assistive device Large base quad cane    Gait Pattern Step-through pattern;Decreased stride length;Decreased arm swing - left    Ambulation Surface Level;Indoor               Balance Exercises - 10/06/19 0001  Balance Exercises: Standing   Standing Eyes Closed Foam/compliant surface;3 reps;30 secs    Standing Eyes Closed Limitations feet hip width distance (2 x 30 seconds), progressing to more narrow BOS, but not quite touching, multiple reps with goal of 30 seconds, with feet more narrow only able to hold approx. 10 seconds    Stepping Strategy Foam/compliant surface;Anterior;Posterior    Stepping Strategy Limitations on blue foam beam, alternating legs, cues for incr foot clearance and progressing to no UE support, x10 reps anterior, x10 reps posterior     Partial Tandem Stance Foam/compliant surface;2 reps;30 secs    Partial Tandem Stance Limitations pt with incr difficulty with LLE posteriorly (incr pt's HR to 125 bpm), needing min guard and needing intermittent fingertip taps to chair for balance    Retro Gait 4 reps;Foam/compliant surface;Limitations    Retro Gait Limitations on  blue mat, cues for weight shift and longer step lengths, no UE support    Other Standing Exercises standing on blue mat: alternating stepping over 2" beam for weight shifting/SLS, beginning with single UE support and progressing to none, min guard for balance x10 reps               PT Short Term Goals - 09/10/19 1020      PT SHORT TERM GOAL #1   Title Pt will be independent with initial HEP in order to build upon functional gains made in therapy. 09/14/19    Baseline 09/10/19: met with current program which spouse/pt report are still challenging.    Status Achieved    Target Date 09/14/19      PT SHORT TERM GOAL #2   Title Pt will improve DGI score to at least a 14/24 in order to demo decr fall risk.    Baseline 09/10/19: 18/24 scored today    Status Achieved      PT SHORT TERM GOAL #3   Title Pt and pt's spouse will verbalize understanding of fall prevention in the home.    Baseline 09/10/19: met in session today    Time --    Period --    Status Achieved      PT SHORT TERM GOAL #4   Title Stairs to be assessed as appropriate - LTG to be written as appropriate.    Baseline 09/03/19: min guard assist with single rail sideways for 4 stairs x 4 reps in session today, step to pattern.    Status Achieved      PT SHORT TERM GOAL #5   Title Pt will perform 5x sit <> stand with no BLE bracing against mat table/chair and improved eccentric control in order  to demo improved safety with transfers.    Baseline 09/10/19: met in session today with time of 9.32 sec's with no UE assist, no retropulsion    Time --    Period --    Status Achieved             PT Long Term Goals - 10/06/19 1249      PT LONG TERM GOAL #1   Title Pt will be independent with final HEP in order to build upon functional gains made in therapy. ALL LTGS DUE 10/14/19    Time 8    Period Weeks    Status New      PT LONG TERM GOAL #2   Title Pt will perform at least 12 steps with step to pattern vs. step through  pattern with single handrail with supervision in order  to safely enter/exit home and go to 2nd floor.    Time 8    Period Weeks    Status New      PT LONG TERM GOAL #3   Title Pt will improve DGI score to at least a 17/24 in order to demo decr fall risk.    Baseline 11/24    Time 8    Period Weeks    Status Revised      PT LONG TERM GOAL #4   Title Pt will perform TUG with no AD vs. LRAD in 13.5 seconds or less in order to demo decr fall risk.    Time 8    Period Weeks    Status New      PT LONG TERM GOAL #5   Title Pt will ambulate at least 500' with supervision over level indoor and unlevel outdoor surfaces in order to demo improved community mobility.    Time 8    Period Weeks    Status New                 Plan - 10/06/19 1247    Clinical Impression Statement Pt with returns orders for PT after thoracocentesis yesterday from L pleural effusion. Pt's O2 sats (ranging from 95-98% and HR WFL (at most 125-130 bpm after activity and back down to 87-90 bpm with seated rest break) throughout session. Focus of today's skilled session was balance strategies on compliant surfaces, seated and standing rest breaks taken as needed. Pt able to improve with decr UE support on compliant surfaces .LTG date revised due to this being only week 7 in [place]. Will progress towards LTGs.    Personal Factors and Comorbidities Comorbidity 3+;Past/Current Experience;Behavior Pattern    Comorbidities BPH, CAD, HTN, CKD III, prediabetes, mild cognitive impairment,    Examination-Activity Limitations Transfers;Stairs;Locomotion Level    Examination-Participation Restrictions Community Activity    Stability/Clinical Decision Making Evolving/Moderate complexity    Rehab Potential Good    PT Frequency 2x / week    PT Duration 8 weeks    PT Treatment/Interventions ADLs/Self Care Home Management;DME Instruction;Stair training;Therapeutic activities;Functional mobility training;Gait training;Therapeutic  exercise;Balance training;Neuromuscular re-education;Patient/family education;Vestibular    PT Next Visit Plan SLS activities. balance with head motions upgrade HEP as appropriate. continue gait training with LRAD/walking stick, balance and strengthening ex's.    PT Home Exercise Plan Access Code: [email or web address]    Consulted and Agree with Plan of Care Patient;Family member/caregiver    Family Member Consulted pt's spouse           Patient will benefit from skilled therapeutic intervention in order to improve the following deficits and impairments:  Abnormal gait, Decreased balance, Decreased activity tolerance, Decreased coordination, Decreased knowledge of use of DME, Decreased safety awareness, Decreased strength, Difficulty walking, Dizziness, Impaired UE functional use  Visit Diagnosis: Muscle weakness (generalized)  Other symptoms and signs involving the nervous system  Difficulty in walking, not elsewhere classified  Unsteadiness on feet  Other abnormalities of gait and mobility     Problem List Patient Active Problem List   Diagnosis Date Noted  . Fracture of multiple ribs with pain 08/10/2019  . Clavicle fracture 08/10/2019  . Closed displaced fracture of phalanx of left thumb, sequela 08/10/2019  . TBI (traumatic brain injury) ([place]) 07/23/2019  . Traumatic closed fracture of distal clavicle with minimal displacement, left, initial encounter 07/19/2019  . Traumatic brain injury with loss of consciousness ([place])   . Multiple trauma   . Benign prostatic  hyperplasia   . Essential hypertension   . AKI (acute kidney injury) ([place])   . Stage 3b chronic kidney disease   . Prediabetes   . Acute blood loss anemia   . Thrombocytopenia ([place])   . Fall 07/14/2019  . Type 2 diabetes mellitus ([place])   . Eosinophilia   . Need for prophylactic vaccination and inoculation against influenza 11/12/2013  . Nocturnal leg cramps 11/23/2012  . Pain in joint, shoulder region 11/23/2012  .  Fall 11/04/2012  . Hematoma 11/04/2012  . Acute upper respiratory infections of unspecified site 11/04/2012  . HYPERLIPIDEMIA TYPE IIB / III 03/08/2008  . Orthostatic hypotension 03/08/2008  . CAD, NATIVE VESSEL 03/08/2008    Lillia Pauls, DPT  10/06/2019, 12:50 PM  [facility] [address] [place], [place], [address] Phone: [phone]   Fax:  [phone]  Name: [name] MRN: [id number] Date of Birth: 26-Feb-1933

## 2019-10-06 NOTE — Therapy (Signed)
[facility] [address] [place], [place], [address] Phone: [phone]   Fax:  [phone]  Speech Language Pathology Treatment/Progress note  Patient Details  Name: [name] MRN: [id number] Date of Birth: 1933/12/19 Referring Provider (SLP): Reesa Chew, PA-C   Encounter Date: 10/06/2019   End of Session - 10/06/19 1322    Visit Number 10    Number of Visits 17    Date for SLP Re-Evaluation 11/16/19    SLP Start Time 1104    SLP Stop Time  1146    SLP Time Calculation (min) 42 min    Activity Tolerance Patient tolerated treatment well           Past Medical History:  Diagnosis Date  . Allergy   . BPH (benign prostatic hyperplasia)   . BPH (benign prostatic hypertrophy)   . CAD (coronary artery disease)    s/p cypher DES to pLAD 6/08; normal LVF;  ETT-Myoview 2009: no ischemia   . Coronary artery disease   . Eosinophilia   . High cholesterol   . HTN (hypertension)   . Hyperlipidemia   . Hypertension   . MI (myocardial infarction) ([place])   . Myocardial infarction ([place])   . Prediabetes   . Trigeminal neuralgia     Past Surgical History:  Procedure Laterality Date  . APPENDECTOMY    . CARDIAC CATHETERIZATION  07/30/2006   CORONARY ANGIOPLASTY WITH STENT PLACEMENT  . CARDIAC CATHETERIZATION    . EXPLORATORY LAPAROTOMY     age 35  . EXPLORATORY LAPAROTOMY    . IR THORACENTESIS ASP PLEURAL SPACE W/IMG GUIDE  10/05/2019    There were no vitals filed for this visit.   Subjective Assessment - 10/06/19 1108    Subjective Pt got out homework spontaneously for SLP.    Patient is accompained by: Family member   wife-Karen   Currently in Pain? Yes    Pain Score 4     Pain Location Scapula    Pain Orientation Left    Pain Descriptors / Indicators Aching;Sharp    Pain Type Acute pain                 ADULT SLP TREATMENT - 10/06/19 1113      General Information   Behavior/Cognition  Alert;Cooperative;Pleasant mood      Treatment Provided   Treatment provided Cognitive-Linquistic      Cognitive-Linquistic Treatment   Treatment focused on Cognition;Patient/family/caregiver education    Skilled Treatment Pt incorrectly also gave SLP a book of crosswords saying "I did these too, which you gave me." SLP spent time reviewing pt's homework and pt req'd step by step completion of cognitive task for elapsed time between two times. Pt's wife stated, "He still can't remember how to call me, and when I'm at school this is important." SLP demonstrated to wife how to assist pt practicing steps to call wife on his phone. Wife thanked SLP for this. Wife stated pt is "remembering more than he was".       Assessment / Recommendations / Plan   Plan Continue with current plan of care      Progression Toward Goals   Progression toward goals Progressing toward goals            SLP Education - 10/06/19 1322    Education Details wife-how to assist pt practicing how to call wife    Person(s) Educated Patient;Spouse    Methods Explanation;Demonstration;Verbal cues    Comprehension  Verbalized understanding;Returned demonstration;Need further instruction            SLP Short Term Goals - 09/22/19 1539      SLP SHORT TERM GOAL #1   Title pt will demo knowledge of his memory copmensation system by attempt to use with rare min cues in 2 sessions    Baseline 09-16-19    Status Partially Met      SLP [place] #2   Title pt will demo correct/successful orientation to a memory compensation system in 3 sessions    Status Not Met      SLP SHORT TERM GOAL #3   Title pt will tell SLP 2 cognitive deficits independently, and how they may impact him at home with rare min A in 3 sessions    Status Not Met      SLP SHORT TERM GOAL #4   Title pt will undergo further cognitive linguisitic testing PRN    Period --   or 7 total sessions   Status Achieved      SLP SHORT TERM GOAL #5    Title Patient will use compensations for attention during functional task (eg conversation or reading on topic of interest) with min cues x 2 sessions.    Status Not Met            SLP Long Term Goals - 10/06/19 1324      SLP LONG TERM GOAL #1   Title pt will demo correct usage of a memory compensation system for appointment and/or medication management, to-do lists, daily schedules in 3 sessions    Time 3    Period Weeks   or 17 total sessions, for all LTGs   Status On-going      SLP LONG TERM GOAL #2   Title pt will demo anticipatory awareness regarding deficit areas, independently, in 3 sessions    Time 3    Period Weeks    Status On-going      SLP LONG TERM GOAL #3   Title Pt will maintain selective attention in functional task for 8 minutes in mod noisy environment with rare min cues x 2 sessions.    Time 3    Period Weeks    Status On-going            Plan - 10/06/19 1323    Clinical Impression Statement Pt presents today with moderate-severe cognitive communication deficits; most significantly in attention and memory. Impulsivity, decreased awareness noted. Pt did not meet most of his STGs -LTGs to be modified PRN. Pt cont to require usual mod-max cues for where to look in memory book. Continue skilled ST to increase pt independence with schedule and daily tasks.    Speech Therapy Frequency 2x / week    Duration --   8 weeks or 17 total sessions   Treatment/Interventions Language facilitation;Cueing hierarchy;Cognitive reorganization;Internal/external aids;Patient/family education;Compensatory strategies;SLP instruction and feedback;Functional tasks;Environmental controls    Potential to Achieve Goals Good    Potential Considerations Severity of impairments;Ability to learn/carryover information    Consulted and Agree with Plan of Care Patient           Patient will benefit from skilled therapeutic intervention in order to improve the following deficits and  impairments:   Cognitive communication deficit   Speech Therapy Progress Note  Dates of Reporting Period: 08-18-19 to present  Subjective Statement: Pt has been seen for 10 sessions focusing on cognitive linguistic skills.  Objective: Pt has improved ability to use  memory notebook.  Goal Update: see above  Plan: See above  Reason Skilled Services are Required: Pt has not yet reached max potential, wife also requires more training on how to assist/cue pt at home.    Problem List Patient Active Problem List   Diagnosis Date Noted  . Fracture of multiple ribs with pain 08/10/2019  . Clavicle fracture 08/10/2019  . Closed displaced fracture of phalanx of left thumb, sequela 08/10/2019  . TBI (traumatic brain injury) ([place]) 07/23/2019  . Traumatic closed fracture of distal clavicle with minimal displacement, left, initial encounter 07/19/2019  . Traumatic brain injury with loss of consciousness ([place])   . Multiple trauma   . Benign prostatic hyperplasia   . Essential hypertension   . AKI (acute kidney injury) ([place])   . Stage 3b chronic kidney disease   . Prediabetes   . Acute blood loss anemia   . Thrombocytopenia ([place])   . Fall 07/14/2019  . Type 2 diabetes mellitus ([place])   . Eosinophilia   . Need for prophylactic vaccination and inoculation against influenza 11/12/2013  . Nocturnal leg cramps 11/23/2012  . Pain in joint, shoulder region 11/23/2012  . Fall 11/04/2012  . Hematoma 11/04/2012  . Acute upper respiratory infections of unspecified site 11/04/2012  . HYPERLIPIDEMIA TYPE IIB / III 03/08/2008  . Orthostatic hypotension 03/08/2008  . CAD, NATIVE VESSEL 03/08/2008    [facility] ,[facility], [place]  10/06/2019, 1:25 PM  [facility] [address] [place], [place], [address] Phone: [phone]   Fax:  [phone]   Name: [name] MRN: [id number] Date of Birth: 03-05-1933

## 2019-10-06 NOTE — Therapy (Signed)
[facility] [address] [place], [place], [address] Phone: [phone]   Fax:  [phone]  Occupational Therapy Treatment  Patient Details  Name: [name] MRN: [id number] Date of Birth: [birth year] Referring Provider (OT): Reesa Chew   Encounter Date: 10/06/2019   OT End of Session - 10/06/19 3888    Visit Number 10    Number of Visits 17    Date for OT Re-Evaluation 10/27/19    Authorization Type [employer] - Follow Medicare guidelines    Authorization - Visit Number 10    Authorization - Number of Visits 10    Progress Note Due on Visit 10    OT Start Time 1021    OT Stop Time 1100    OT Time Calculation (min) 39 min    Activity Tolerance Patient tolerated treatment well    Behavior During Therapy [facility] for tasks assessed/performed;Impulsive           Past Medical History:  Diagnosis Date  . Allergy   . BPH (benign prostatic hyperplasia)   . BPH (benign prostatic hypertrophy)   . CAD (coronary artery disease)    s/p cypher DES to pLAD 6/08; normal LVF;  ETT-Myoview 2009: no ischemia   . Coronary artery disease   . Eosinophilia   . High cholesterol   . HTN (hypertension)   . Hyperlipidemia   . Hypertension   . MI (myocardial infarction) ([place])   . Myocardial infarction ([place])   . Prediabetes   . Trigeminal neuralgia     Past Surgical History:  Procedure Laterality Date  . APPENDECTOMY    . CARDIAC CATHETERIZATION  07/30/2006   CORONARY ANGIOPLASTY WITH STENT PLACEMENT  . CARDIAC CATHETERIZATION    . EXPLORATORY LAPAROTOMY     age 6  . EXPLORATORY LAPAROTOMY    . IR THORACENTESIS ASP PLEURAL SPACE W/IMG GUIDE  10/05/2019    There were no vitals filed for this visit.   Subjective Assessment - 10/06/19 1024    Subjective  removed fluid from L lung yesterday (see media for MD clearance for pt to participate therapy as tolerated).   Wife reports that pt is cooking with supervision at  home (occasional min cueing)    Patient is accompanied by: Family member    Currently in Pain? Yes    Pain Score 4     Pain Location Scapula    Pain Orientation Left    Pain Descriptors / Indicators Aching;Sharp    Pain Type Acute pain    Aggravating Factors  deep breathing, from procedure to remove fluid from lungs    Pain Relieving Factors shallow breathing              Fludio x8 min to L hand due to pain and stiffness.    Discussed progress towards goals, particularly with cooking and incr LUE functional use.    Placing/removing clothespins with 1-2lb resistance for incr pinch strength, min cueing for compensation.  Dealing cards with thumb with min cueing for shoulder positioning/compensation         OT Education - 10/06/19 1544    Education Details Updated putty HEP for grip only to red putty (pt to continue with yellow putty for tip and lateral pinch)    Person(s) Educated Patient;Spouse    Methods Explanation;Verbal cues;Demonstration    Comprehension Verbalized understanding;Verbal cues required;Returned demonstration            OT Short Term Goals - 09/29/19 1105  OT SHORT TERM GOAL #1   Title Patient will complete an HEP designed to improve left thumb range of motion with min cueing due 09/27/19    Time 4    Period Weeks    Status Achieved    Target Date 09/27/19      OT SHORT TERM GOAL #2   Title Patient will complete a home exercise program designed to improve coordination in left hand with set up assistance    Time 4    Period Weeks    Status Achieved      OT SHORT TERM GOAL #3   Title Patient will don/doff a pull over shirt with set up assistance    Time 4    Period Weeks    Status Achieved      OT SHORT TERM GOAL #4   Title Patient will demonstrate low reach to obtain a lightweight (less than 3 lb) object with left hand    Time 4    Period Weeks    Status Achieved             OT Long Term Goals - 10/06/19 1535      OT LONG  TERM GOAL #1   Title Patient will complete HEP/Home activities program to improve functional mobility and use of left hand due 9/21    Time 8    Period Weeks    Status On-going      OT LONG TERM GOAL #2   Title Patient will bathe and dress himself with modified independence - with increased time    Time 8    Period Weeks    Status Partially Met   pt requires set up and supervision for bathing for safety     OT LONG TERM GOAL #3   Title Patient will prepare a familiar hot meal using left hand as non dominant with supervision    Time 8    Period Weeks    Status Achieved   done once at home.  10/06/19:  per wife, performed multiple times with supervision at home     OT [place] #4   Title Patient will demonstrate ability to manipulate small items within left hand as evidenced by his ability to complete 9 hole peg test in less than 1 minute    Time 8    Period Weeks    Status Achieved   34.69 sec     OT LONG TERM GOAL #5   Title Patient will understand recommendations related to driving    Time 8    Period Weeks    Status New                 Plan - 10/06/19 1532    Clinical Impression Statement Progress Note Reporting Period 08/13/19-10/06/19.  Pt has met all STGs, 2 LTGs and and is progressing towards remaining long-term goals with improving L hand functional use, ROM, and coordination.  Pt/wife report ability to complete familiar cooking tasks with supervision.  Pt continues to report L thumb/wrist pain and would benefit from continued occupational therapy to address remaining deficits for incr participation in IADLs and improved LUE functional use.    Occupational performance deficits (Please refer to evaluation for details): ADL's;IADL's;Leisure;Rest and Sleep    Body Structure / Function / Physical Skills ADL;Coordination;Endurance;GMC;UE functional use;Sensation;Decreased knowledge of precautions;Balance;Body mechanics;Decreased knowledge of use of  DME;Flexibility;IADL;Pain;Strength;FMC;Dexterity;Cardiopulmonary status limiting activity;ROM;Mobility;Tone    Cognitive Skills Attention;Thought;Problem Solve;Safety Awareness;Memory;Energy/Drive    OT Frequency 2x /  week    OT Duration 8 weeks    OT Treatment/Interventions Self-care/ADL training;Electrical Stimulation;Iontophoresis;Therapeutic exercise;Moist Heat;Paraffin;Neuromuscular education;Splinting;Patient/family education;Balance training;Therapeutic activities;Functional Mobility Training;Fluidtherapy;Cryotherapy;Ultrasound;DME and/or AE instruction;Manual Therapy;Passive range of motion;Cognitive remediation/compensation    Plan L hand functional use    Consulted and Agree with Plan of Care Patient;Family member/caregiver           Patient will benefit from skilled therapeutic intervention in order to improve the following deficits and impairments:   Body Structure / Function / Physical Skills: ADL, Coordination, Endurance, GMC, UE functional use, Sensation, Decreased knowledge of precautions, Balance, Body mechanics, Decreased knowledge of use of DME, Flexibility, IADL, Pain, Strength, FMC, Dexterity, Cardiopulmonary status limiting activity, ROM, Mobility, Tone Cognitive Skills: Attention, Thought, Problem Solve, Safety Awareness, Memory, Energy/Drive     Visit Diagnosis: Stiffness of left hand, not elsewhere classified  Pain in left hand  Muscle weakness (generalized)  Other symptoms and signs involving the nervous system  Unsteadiness on feet  Stiffness of left wrist, not elsewhere classified  Attention and concentration deficit    Problem List Patient Active Problem List   Diagnosis Date Noted  . Fracture of multiple ribs with pain 08/10/2019  . Clavicle fracture 08/10/2019  . Closed displaced fracture of phalanx of left thumb, sequela 08/10/2019  . TBI (traumatic brain injury) ([place]) 07/23/2019  . Traumatic closed fracture of distal clavicle with minimal  displacement, left, initial encounter 07/19/2019  . Traumatic brain injury with loss of consciousness ([place])   . Multiple trauma   . Benign prostatic hyperplasia   . Essential hypertension   . AKI (acute kidney injury) ([place])   . Stage 3b chronic kidney disease   . Prediabetes   . Acute blood loss anemia   . Thrombocytopenia ([place])   . Fall 07/14/2019  . Type 2 diabetes mellitus ([place])   . Eosinophilia   . Need for prophylactic vaccination and inoculation against influenza 11/12/2013  . Nocturnal leg cramps 11/23/2012  . Pain in joint, shoulder region 11/23/2012  . Fall 11/04/2012  . Hematoma 11/04/2012  . Acute upper respiratory infections of unspecified site 11/04/2012  . HYPERLIPIDEMIA TYPE IIB / III 03/08/2008  . Orthostatic hypotension 03/08/2008  . CAD, NATIVE VESSEL 03/08/2008    [facility] 10/06/2019, 3:46 PM  [facility] [address] [place], [place], [address] Phone: [phone]   Fax:  [phone]  Name: [name] MRN: [id number] Date of Birth: 06/10/1933   Vianne Bulls, OTR/L [facility] [address] [place], [place]  [address] [phone] phone [phone] 10/06/19 3:46 PM

## 2019-10-08 ENCOUNTER — Encounter: Payer: Self-pay | Admitting: Occupational Therapy

## 2019-10-08 ENCOUNTER — Other Ambulatory Visit (HOSPITAL_COMMUNITY): Payer: PPO

## 2019-10-08 ENCOUNTER — Ambulatory Visit: Payer: PPO | Admitting: Speech Pathology

## 2019-10-08 ENCOUNTER — Encounter: Payer: Self-pay | Admitting: Physical Therapy

## 2019-10-08 ENCOUNTER — Ambulatory Visit: Payer: PPO | Attending: Physical Medicine and Rehabilitation | Admitting: Physical Therapy

## 2019-10-08 ENCOUNTER — Ambulatory Visit: Payer: PPO | Admitting: Occupational Therapy

## 2019-10-08 ENCOUNTER — Other Ambulatory Visit: Payer: Self-pay

## 2019-10-08 DIAGNOSIS — S069X0D Unspecified intracranial injury without loss of consciousness, subsequent encounter: Secondary | ICD-10-CM | POA: Diagnosis not present

## 2019-10-08 DIAGNOSIS — R29818 Other symptoms and signs involving the nervous system: Secondary | ICD-10-CM | POA: Diagnosis not present

## 2019-10-08 DIAGNOSIS — R41841 Cognitive communication deficit: Secondary | ICD-10-CM | POA: Insufficient documentation

## 2019-10-08 DIAGNOSIS — M79642 Pain in left hand: Secondary | ICD-10-CM | POA: Insufficient documentation

## 2019-10-08 DIAGNOSIS — M6281 Muscle weakness (generalized): Secondary | ICD-10-CM

## 2019-10-08 DIAGNOSIS — R262 Difficulty in walking, not elsewhere classified: Secondary | ICD-10-CM | POA: Diagnosis not present

## 2019-10-08 DIAGNOSIS — R2681 Unsteadiness on feet: Secondary | ICD-10-CM

## 2019-10-08 DIAGNOSIS — R2689 Other abnormalities of gait and mobility: Secondary | ICD-10-CM | POA: Diagnosis not present

## 2019-10-08 DIAGNOSIS — M25632 Stiffness of left wrist, not elsewhere classified: Secondary | ICD-10-CM | POA: Insufficient documentation

## 2019-10-08 DIAGNOSIS — M25642 Stiffness of left hand, not elsewhere classified: Secondary | ICD-10-CM

## 2019-10-08 DIAGNOSIS — R41840 Attention and concentration deficit: Secondary | ICD-10-CM

## 2019-10-08 NOTE — Therapy (Signed)
[facility] [address] [place], [place], [address] Phone: [phone]   Fax:  [phone]  Occupational Therapy Treatment  Patient Details  Name: [name] MRN: [id number] Date of Birth: [birth year] Referring Provider (OT): Reesa Chew   Encounter Date: 10/08/2019   OT End of Session - 10/08/19 0938    Visit Number 11    Number of Visits 17    Date for OT Re-Evaluation 10/27/19    Authorization Type [employer] - Follow Medicare guidelines    Authorization - Visit Number 11    Authorization - Number of Visits 20    Progress Note Due on Visit 20    OT Start Time 0936    OT Stop Time 1015    OT Time Calculation (min) 39 min    Activity Tolerance Patient tolerated treatment well    Behavior During Therapy [facility] for tasks assessed/performed;Impulsive           Past Medical History:  Diagnosis Date  . Allergy   . BPH (benign prostatic hyperplasia)   . BPH (benign prostatic hypertrophy)   . CAD (coronary artery disease)    s/p cypher DES to pLAD 6/08; normal LVF;  ETT-Myoview 2009: no ischemia   . Coronary artery disease   . Eosinophilia   . High cholesterol   . HTN (hypertension)   . Hyperlipidemia   . Hypertension   . MI (myocardial infarction) ([place])   . Myocardial infarction ([place])   . Prediabetes   . Trigeminal neuralgia     Past Surgical History:  Procedure Laterality Date  . APPENDECTOMY    . CARDIAC CATHETERIZATION  07/30/2006   CORONARY ANGIOPLASTY WITH STENT PLACEMENT  . CARDIAC CATHETERIZATION    . EXPLORATORY LAPAROTOMY     age 74  . EXPLORATORY LAPAROTOMY    . IR THORACENTESIS ASP PLEURAL SPACE W/IMG GUIDE  10/05/2019    There were no vitals filed for this visit.   Subjective Assessment - 10/08/19 0937    Subjective  no thumb pain at rest.  Wife reports that pt cooked last night using LUE appropriately (supervision only)    Patient is accompanied by: Family member    Currently  in Pain? No/denies            Fludio x10 min to L hand due to pain and stiffness with no adverse reactions.  Rotating ball in fingertips each direction with min difficulty, no pain.  Fastening/unfastening buttons with min difficulty and 3/10 pain  Rotating 2 golf balls in hand with mod difficulty and pain (discontinued with pain).  Placing grooved pegs in pegboard for incr coordination/in-hand manipulation with min cueing for shoulder compensation.  Placing clothespins with 1-2lb resistance to place on vertical pole and removing for incr strength, min-mod cueing for shoulder compensation patterns.  Recommended pt try to avoid combined wrist/thumb movements with lifting as this may be contributing to pain and discussed using neutral wrist with pinching, gripping, carrying tasks.  Manipulating large dice in L hand to specific number with good accuracy, no significant difficulty.  Assessed L grip/pinch strength--see below.       [facility] OT Assessment - 10/08/19 0001      Hand Function   Right Hand Grip (lbs) 63    Right Hand Lateral Pinch 14 lbs    Right Hand 3 Point Pinch 14 lbs    Left Hand Grip (lbs) 46.2    Left Hand Lateral Pinch 9 lbs    Left  3 point pinch 9 lbs                    OT Short Term Goals - 09/29/19 1105      OT SHORT TERM GOAL #1   Title Patient will complete an HEP designed to improve left thumb range of motion with min cueing due 09/27/19    Time 4    Period Weeks    Status Achieved    Target Date 09/27/19      OT SHORT TERM GOAL #2   Title Patient will complete a home exercise program designed to improve coordination in left hand with set up assistance    Time 4    Period Weeks    Status Achieved      OT SHORT TERM GOAL #3   Title Patient will don/doff a pull over shirt with set up assistance    Time 4    Period Weeks    Status Achieved      OT SHORT TERM GOAL #4   Title Patient will demonstrate low reach to obtain a lightweight  (less than 3 lb) object with left hand    Time 4    Period Weeks    Status Achieved             OT Long Term Goals - 10/06/19 1535      OT LONG TERM GOAL #1   Title Patient will complete HEP/Home activities program to improve functional mobility and use of left hand due 9/21    Time 8    Period Weeks    Status On-going      OT LONG TERM GOAL #2   Title Patient will bathe and dress himself with modified independence - with increased time    Time 8    Period Weeks    Status Partially Met   pt requires set up and supervision for bathing for safety     OT LONG TERM GOAL #3   Title Patient will prepare a familiar hot meal using left hand as non dominant with supervision    Time 8    Period Weeks    Status Achieved   done once at home.  10/06/19:  per wife, performed multiple times with supervision at home     OT [place] #4   Title Patient will demonstrate ability to manipulate small items within left hand as evidenced by his ability to complete 9 hole peg test in less than 1 minute    Time 8    Period Weeks    Status Achieved   34.69 sec     OT LONG TERM GOAL #5   Title Patient will understand recommendations related to driving    Time 8    Period Weeks    Status New                 Plan - 10/08/19 1950    Clinical Impression Statement Pt is progressing with L hand incr strength and functional use.  However, pt continues to report pain at times with use, but primarily at radial wrist/base of thumb.  Recommended that pt try to keep wrist neutral with gripping/pinching (avoid combined wrist/thumb movements) which may help.  Pt/wife verbalized understanding.    Occupational performance deficits (Please refer to evaluation for details): ADL's;IADL's;Leisure;Rest and Sleep    Body Structure / Function / Physical Skills ADL;Coordination;Endurance;GMC;UE functional use;Sensation;Decreased knowledge of precautions;Balance;Body mechanics;Decreased knowledge of use of  DME;Flexibility;IADL;Pain;Strength;FMC;Dexterity;Cardiopulmonary status  limiting activity;ROM;Mobility;Tone    Cognitive Skills Attention;Thought;Problem Solve;Safety Awareness;Memory;Energy/Drive    OT Frequency 2x / week    OT Duration 8 weeks    OT Treatment/Interventions Self-care/ADL training;Electrical Stimulation;Iontophoresis;Therapeutic exercise;Moist Heat;Paraffin;Neuromuscular education;Splinting;Patient/family education;Balance training;Therapeutic activities;Functional Mobility Training;Fluidtherapy;Cryotherapy;Ultrasound;DME and/or AE instruction;Manual Therapy;Passive range of motion;Cognitive remediation/compensation    Plan L hand functional use, address driving recommendations (LTG #5)    Consulted and Agree with Plan of Care Patient;Family member/caregiver           Patient will benefit from skilled therapeutic intervention in order to improve the following deficits and impairments:   Body Structure / Function / Physical Skills: ADL, Coordination, Endurance, GMC, UE functional use, Sensation, Decreased knowledge of precautions, Balance, Body mechanics, Decreased knowledge of use of DME, Flexibility, IADL, Pain, Strength, FMC, Dexterity, Cardiopulmonary status limiting activity, ROM, Mobility, Tone Cognitive Skills: Attention, Thought, Problem Solve, Safety Awareness, Memory, Energy/Drive     Visit Diagnosis: Stiffness of left hand, not elsewhere classified  Pain in left hand  Attention and concentration deficit    Problem List Patient Active Problem List   Diagnosis Date Noted  . Fracture of multiple ribs with pain 08/10/2019  . Clavicle fracture 08/10/2019  . Closed displaced fracture of phalanx of left thumb, sequela 08/10/2019  . TBI (traumatic brain injury) ([place]) 07/23/2019  . Traumatic closed fracture of distal clavicle with minimal displacement, left, initial encounter 07/19/2019  . Traumatic brain injury with loss of consciousness ([place])   . Multiple  trauma   . Benign prostatic hyperplasia   . Essential hypertension   . AKI (acute kidney injury) ([place])   . Stage 3b chronic kidney disease   . Prediabetes   . Acute blood loss anemia   . Thrombocytopenia ([place])   . Fall 07/14/2019  . Type 2 diabetes mellitus ([place])   . Eosinophilia   . Need for prophylactic vaccination and inoculation against influenza 11/12/2013  . Nocturnal leg cramps 11/23/2012  . Pain in joint, shoulder region 11/23/2012  . Fall 11/04/2012  . Hematoma 11/04/2012  . Acute upper respiratory infections of unspecified site 11/04/2012  . HYPERLIPIDEMIA TYPE IIB / III 03/08/2008  . Orthostatic hypotension 03/08/2008  . CAD, NATIVE VESSEL 03/08/2008    [facility] 10/08/2019, 10:31 AM  [facility] [address] [place], [place], [address] Phone: [phone]   Fax:  [phone]  Name: [name] MRN: [id number] Date of Birth: [birth year]   Vianne Bulls, OTR/L [facility] [address] [place], [place]  [address] [phone] phone [phone] 10/08/19 10:31 AM

## 2019-10-08 NOTE — Therapy (Signed)
[facility] [address] [place], [place], [address] Phone: [phone]   Fax:  [phone]  Speech Language Pathology Treatment  Patient Details  Name: [name] MRN: [id number] Date of Birth: 12-14-1933 Referring Provider (SLP): Reesa Chew, PA-C   Encounter Date: 10/08/2019   End of Session - 10/08/19 0952    Visit Number 11    Number of Visits 17    Date for SLP Re-Evaluation 11/16/19    SLP Start Time 0901    SLP Stop Time  0931    SLP Time Calculation (min) 30 min    Activity Tolerance Patient tolerated treatment well           Past Medical History:  Diagnosis Date  . Allergy   . BPH (benign prostatic hyperplasia)   . BPH (benign prostatic hypertrophy)   . CAD (coronary artery disease)    s/p cypher DES to pLAD 6/08; normal LVF;  ETT-Myoview 2009: no ischemia   . Coronary artery disease   . Eosinophilia   . High cholesterol   . HTN (hypertension)   . Hyperlipidemia   . Hypertension   . MI (myocardial infarction) ([place])   . Myocardial infarction ([name])   . Prediabetes   . Trigeminal neuralgia     Past Surgical History:  Procedure Laterality Date  . APPENDECTOMY    . CARDIAC CATHETERIZATION  07/30/2006   CORONARY ANGIOPLASTY WITH STENT PLACEMENT  . CARDIAC CATHETERIZATION    . EXPLORATORY LAPAROTOMY     age 89  . EXPLORATORY LAPAROTOMY    . IR THORACENTESIS ASP PLEURAL SPACE W/IMG GUIDE  10/05/2019    There were no vitals filed for this visit.   Subjective Assessment - 10/08/19 0943    Subjective Pt nearly tripped over cane which he left leaning against counter at check in.    Patient is accompained by: Family member   wife [name]   Currently in Pain? No/denies                 ADULT SLP TREATMENT - 10/08/19 0944      General Information   Behavior/Cognition Alert;Cooperative;Pleasant mood;Impulsive      Treatment Provided   Treatment provided Cognitive-Linquistic       Pain Assessment   Pain Assessment No/denies pain      Cognitive-Linquistic Treatment   Treatment focused on Cognition;Patient/family/caregiver education    Skilled Treatment 15 minutes late. Pt nearly tripped at check in and tried to get hand sanitizer from [employer] outside door and the thermostat inside door despite cues from wife. SLP drew pt's attention to impulsive/distracted movements and reminded pt to slow down and move carefully. He is feeling better since having fluid taken off his lungs, and SLP reminded pt that he must curb his impulse to move quickly or risk falling. Pt reported homework was "easy" (cues for alternating attention when checking work against answer sheet; it was 40% correct; 2 errors, 1 omitted question). SLP used this as an example to pt that when something seems easy, if he moves quickly he is likely to make a mistake. Targeted selective attention/attention to detail with map activity; for simple, one-step directions 90% accuracy, usual mod cues, 60% accuracy for multi-component. When SLP asked question about next session, pt reached for memory notebook but required cues to retrieve information accurately.       Assessment / Recommendations / Plan   Plan Other (Comment)   goals downgraded     Progression  Toward Goals   Progression toward goals Progressing toward goals            SLP Education - 10/08/19 0951    Education Details deficit areas; how impulsivity/awareness may impact his safety    Person(s) Educated Patient;Spouse    Methods Explanation;Demonstration    Comprehension Verbalized understanding;Need further instruction            SLP Short Term Goals - 10/08/19 0947      SLP SHORT TERM GOAL #1   Title pt will demo knowledge of his memory copmensation system by attempt to use with rare min cues in 2 sessions    Baseline 09-16-19    Status Partially Met      SLP SHORT TERM GOAL #2   Title pt will demo correct/successful orientation to a  memory compensation system in 3 sessions    Status Not Met      SLP SHORT TERM GOAL #3   Title pt will tell SLP 2 cognitive deficits independently, and how they may impact him at home with rare min A in 3 sessions    Status Not Met      SLP SHORT TERM GOAL #4   Title pt will undergo further cognitive linguisitic testing PRN    Period --   or 7 total sessions   Status Achieved      SLP SHORT TERM GOAL #5   Title Patient will use compensations for attention during functional task (eg conversation or reading on topic of interest) with min cues x 2 sessions.    Status Not Met            SLP Long Term Goals - 10/08/19 0948      SLP LONG TERM GOAL #1   Title pt will demo correct usage of a memory compensation system for appointment and/or medication management, to-do lists, daily schedules in 3 sessions    Time 3    Period Weeks   or 17 total sessions, for all LTGs   Status On-going      SLP LONG TERM GOAL #2   Title pt will demo emergent awareness by ID'ing 75% of errors in cognitive linguistic tasks with double-checking x 2 sessions.    Time 3    Period Weeks    Status Revised      SLP LONG TERM GOAL #3   Title Pt will maintain selective attention in functional task for 8 minutes in mod noisy environment with rare min cues x 2 sessions.    Time 3    Period Weeks    Status On-going            Plan - 10/08/19 9741    Clinical Impression Statement Pt presents today with moderate-severe cognitive communication deficits; most significantly in attention and memory. Impulsivity, decreased awareness noted. Pt did not meet most of his STGs; downgraded goal for awareness. Other LTGs to be modified PRN. Pt cont to require usual mod-max cues for where to look in memory book. Continue skilled ST to increase pt independence with schedule and daily tasks.    Speech Therapy Frequency 2x / week    Duration --   8 weeks or 17 total sessions   Treatment/Interventions Language  facilitation;Cueing hierarchy;Cognitive reorganization;Internal/external aids;Patient/family education;Compensatory strategies;SLP instruction and feedback;Functional tasks;Environmental controls    Potential to Achieve Goals Good    Potential Considerations Severity of impairments;Ability to learn/carryover information    Consulted and Agree with Plan of Care Patient  Patient will benefit from skilled therapeutic intervention in order to improve the following deficits and impairments:   Cognitive communication deficit  Traumatic brain injury, without loss of consciousness, subsequent encounter    Problem List Patient Active Problem List   Diagnosis Date Noted  . Fracture of multiple ribs with pain 08/10/2019  . Clavicle fracture 08/10/2019  . Closed displaced fracture of phalanx of left thumb, sequela 08/10/2019  . TBI (traumatic brain injury) ([facility]) 07/23/2019  . Traumatic closed fracture of distal clavicle with minimal displacement, left, initial encounter 07/19/2019  . Traumatic brain injury with loss of consciousness ([place])   . Multiple trauma   . Benign prostatic hyperplasia   . Essential hypertension   . AKI (acute kidney injury) ([place])   . Stage 3b chronic kidney disease   . Prediabetes   . Acute blood loss anemia   . Thrombocytopenia ([place])   . Fall 07/14/2019  . Type 2 diabetes mellitus ([employer])   . Eosinophilia   . Need for prophylactic vaccination and inoculation against influenza 11/12/2013  . Nocturnal leg cramps 11/23/2012  . Pain in joint, shoulder region 11/23/2012  . Fall 11/04/2012  . Hematoma 11/04/2012  . Acute upper respiratory infections of unspecified site 11/04/2012  . HYPERLIPIDEMIA TYPE IIB / III 03/08/2008  . Orthostatic hypotension 03/08/2008  . CAD, NATIVE VESSEL 03/08/2008   Deneise Lever, [place], [place] Speech-Language Pathologist  Aliene Altes 10/08/2019, 9:54 AM  [facility] [address] [place], [place], [address] Phone: [phone]   Fax:  [phone]   Name: [name] MRN: [id number] Date of Birth: [birth year]

## 2019-10-09 ENCOUNTER — Telehealth: Payer: Self-pay | Admitting: *Deleted

## 2019-10-09 ENCOUNTER — Other Ambulatory Visit: Payer: Self-pay | Admitting: Physical Medicine and Rehabilitation

## 2019-10-09 DIAGNOSIS — G44201 Tension-type headache, unspecified, intractable: Secondary | ICD-10-CM

## 2019-10-09 DIAGNOSIS — R519 Headache, unspecified: Secondary | ICD-10-CM

## 2019-10-09 NOTE — Therapy (Signed)
[facility] [address] [place], [place], [address] Phone: [phone]   Fax:  [phone]  Physical Therapy Treatment  Patient Details  Name: [name] MRN: [id number] Date of Birth: 04-30-1933 Referring Provider (PT): Reesa Chew PA-C   Encounter Date: 10/08/2019   PT End of Session - 10/08/19 1022    Visit Number 12    Number of Visits 17    Date for PT Re-Evaluation 11/12/19   written for 60 day POC   Authorization Type [employer] P[employer] - $15 co pay, collect 1 co pay per day for multiple disciplines    PT Start Time 1019    PT Stop Time 1059    PT Time Calculation (min) 40 min    Equipment Utilized During Treatment Gait belt    Activity Tolerance Patient tolerated treatment well    Behavior During Therapy [facility] for tasks assessed/performed;Impulsive           Past Medical History:  Diagnosis Date  . Allergy   . BPH (benign prostatic hyperplasia)   . BPH (benign prostatic hypertrophy)   . CAD (coronary artery disease)    s/p cypher DES to pLAD 6/08; normal LVF;  ETT-Myoview 2009: no ischemia   . Coronary artery disease   . Eosinophilia   . High cholesterol   . HTN (hypertension)   . Hyperlipidemia   . Hypertension   . MI (myocardial infarction) ([place])   . Myocardial infarction ([place])   . Prediabetes   . Trigeminal neuralgia     Past Surgical History:  Procedure Laterality Date  . APPENDECTOMY    . CARDIAC CATHETERIZATION  07/30/2006   CORONARY ANGIOPLASTY WITH STENT PLACEMENT  . CARDIAC CATHETERIZATION    . EXPLORATORY LAPAROTOMY     age 86  . EXPLORATORY LAPAROTOMY    . IR THORACENTESIS ASP PLEURAL SPACE W/IMG GUIDE  10/05/2019    There were no vitals filed for this visit.   Subjective Assessment - 10/08/19 1020    Subjective No new complaints. No falls. Reports breathing "feels great" now.    Patient is accompained by: Family member    Pertinent History PMH: BPH, CAD, HTN,  CKD III, prediabetes, mild cognitive impairment.    Patient Stated Goals wants to walk without worrying about potentially having a dizzy spell - reports 4-5 spells a day that lasts only a couple of seconds.    Currently in Pain? Yes    Pain Score 2     Pain Location Hand    Pain Orientation Left    Pain Descriptors / Indicators Aching;Sore    Pain Type Acute pain    Pain Onset More than a month ago    Pain Frequency Intermittent    Aggravating Factors  Fluidotherapy with OT, heat    Pain Relieving Factors certain movements              [facility] PT Assessment - 10/08/19 1026      Dynamic Gait Index   Level Surface Normal    Change in Gait Speed Normal    Gait with Horizontal Head Turns Normal    Gait with Vertical Head Turns Normal    Gait and Pivot Turn Normal    Step Over Obstacle Mild Impairment    Step Around Obstacles Normal    Steps Mild Impairment    Total Score 22      Timed Up and Go Test   Normal TUG (seconds) 11.34   no  AD               [facility] Adult PT Treatment/Exercise - 10/08/19 1026      Transfers   Transfers Sit to Stand;Stand to Sit    Sit to Stand 5: Supervision;With upper extremity assist;From bed;From chair/3-in-1    Stand to Sit 5: Supervision;With upper extremity assist;To bed;To chair/3-in-1      Ambulation/Gait   Ambulation/Gait Yes    Ambulation/Gait Assistance 5: Supervision    Ambulation/Gait Assistance Details use of walking stick with gait with no balance loss noted, heel scuffing noted with gait.     Ambulation Distance (Feet) 500 Feet   x1, plus around gym   Assistive device Other (Comment)   walking stick   Gait Pattern Step-through pattern;Decreased stride length;Decreased arm swing - left    Ambulation Surface Level;Indoor;Unlevel;Outdoor;Paved    Stairs Yes    Stairs Assistance 5: Supervision    Stairs Assistance Details (indicate cue type and reason) no cues or assist needed.     Stair Management Technique One rail Right;One  rail Left;Step to pattern;Alternating pattern;Forwards    Number of Stairs 4   x3   Height of Stairs 6               Balance Exercises - 10/08/19 1048      Balance Exercises: Standing   Standing Eyes Closed Narrow base of support (BOS);Wide (BOA);Head turns;Foam/compliant surface;Other reps (comment);30 secs;Limitations    Standing Eyes Closed Limitations on airex with no UE support- feet together for EC no head movements, progressing to feet apart for EC head movements left<>right, up<>down with up to min assist for balance.                PT Short Term Goals - 09/10/19 1020      PT SHORT TERM GOAL #1   Title Pt will be independent with initial HEP in order to build upon functional gains made in therapy. 09/14/19    Baseline 09/10/19: met with current program which spouse/pt report are still challenging.    Status Achieved    Target Date 09/14/19      PT SHORT TERM GOAL #2   Title Pt will improve DGI score to at least a 14/24 in order to demo decr fall risk.    Baseline 09/10/19: 18/24 scored today    Status Achieved      PT SHORT TERM GOAL #3   Title Pt and pt's spouse will verbalize understanding of fall prevention in the home.    Baseline 09/10/19: met in session today    Time --    Period --    Status Achieved      PT SHORT TERM GOAL #4   Title Stairs to be assessed as appropriate - LTG to be written as appropriate.    Baseline 09/03/19: min guard assist with single rail sideways for 4 stairs x 4 reps in session today, step to pattern.    Status Achieved      PT SHORT TERM GOAL #5   Title Pt will perform 5x sit <> stand with no BLE bracing against mat table/chair and improved eccentric control in order  to demo improved safety with transfers.    Baseline 09/10/19: met in session today with time of 9.32 sec's with no UE assist, no retropulsion    Time --    Period --    Status Achieved             PT Long  Term Goals - 10/08/19 1025      PT LONG TERM GOAL #1    Title Pt will be independent with final HEP in order to build upon functional gains made in therapy. ALL LTGS DUE 10/14/19    Time 8    Period Weeks    Status On-going      PT LONG TERM GOAL #2   Title Pt will perform at least 12 steps with step to pattern vs. step through pattern with single handrail with supervision in order to safely enter/exit home and go to 2nd floor.    Baseline 10/08/19: met in session today    Time --    Period --    Status Achieved      PT LONG TERM GOAL #3   Title Pt will improve DGI score to at least a 17/24 in order to demo decr fall risk.    Baseline 10/08/19: 22/24 scored today    Time --    Period --    Status Achieved      PT LONG TERM GOAL #4   Title Pt will perform TUG with no AD vs. LRAD in 13.5 seconds or less in order to demo decr fall risk.    Baseline 10/08/19: 11.34 sec's no AD    Time --    Period --    Status Achieved      PT LONG TERM GOAL #5   Title Pt will ambulate at least 500' with supervision over level indoor and unlevel outdoor surfaces in order to demo improved community mobility.    Baseline 10/08/19: met in session today with walking stick.    Time --    Period --    Status Achieved                 Plan - 10/08/19 1022    Clinical Impression Statement Today's skilled session focused on progress toward LTGs with all goals met that were checked. Pt improved his timed up and go to 11.34 sec's no AD and his DGI to 22/24 today. Continues to be challenged on compliant surfaces needing up to min assist for balance. The pt is progressing toward goals and should benefit from continued PT to progress toward unmet goals.    Personal Factors and Comorbidities Comorbidity 3+;Past/Current Experience;Behavior Pattern    Comorbidities BPH, CAD, HTN, CKD III, prediabetes, mild cognitive impairment,    Examination-Activity Limitations Transfers;Stairs;Locomotion Level    Examination-Participation Restrictions Community Activity     Stability/Clinical Decision Making Evolving/Moderate complexity    Rehab Potential Good    PT Frequency 2x / week    PT Duration 8 weeks    PT Treatment/Interventions ADLs/Self Care Home Management;DME Instruction;Stair training;Therapeutic activities;Functional mobility training;Gait training;Therapeutic exercise;Balance training;Neuromuscular re-education;Patient/family education;Vestibular    PT Next Visit Plan pt due for recert and discussed with Chloe- look at FGA, [place] and any other functional tests to futher assess balance prior to decision for recert vs discharge    PT Home Exercise Plan Access Code: [email or web address]    Consulted and Agree with Plan of Care Patient;Family member/caregiver    Family Member Consulted pt's spouse           Patient will benefit from skilled therapeutic intervention in order to improve the following deficits and impairments:  Abnormal gait, Decreased balance, Decreased activity tolerance, Decreased coordination, Decreased knowledge of use of DME, Decreased safety awareness, Decreased strength, Difficulty walking, Dizziness, Impaired UE functional use  Visit Diagnosis: Muscle weakness (generalized)  Other  symptoms and signs involving the nervous system  Unsteadiness on feet  Difficulty in walking, not elsewhere classified  Other abnormalities of gait and mobility     Problem List Patient Active Problem List   Diagnosis Date Noted  . Fracture of multiple ribs with pain 08/10/2019  . Clavicle fracture 08/10/2019  . Closed displaced fracture of phalanx of left thumb, sequela 08/10/2019  . TBI (traumatic brain injury) ([place]) 07/23/2019  . Traumatic closed fracture of distal clavicle with minimal displacement, left, initial encounter 07/19/2019  . Traumatic brain injury with loss of consciousness ([place])   . Multiple trauma   . Benign prostatic hyperplasia   . Essential hypertension   . AKI (acute kidney injury) ([place])   . Stage 3b chronic kidney  disease   . Prediabetes   . Acute blood loss anemia   . Thrombocytopenia ([place])   . Fall 07/14/2019  . Type 2 diabetes mellitus ([place])   . Eosinophilia   . Need for prophylactic vaccination and inoculation against influenza 11/12/2013  . Nocturnal leg cramps 11/23/2012  . Pain in joint, shoulder region 11/23/2012  . Fall 11/04/2012  . Hematoma 11/04/2012  . Acute upper respiratory infections of unspecified site 11/04/2012  . HYPERLIPIDEMIA TYPE IIB / III 03/08/2008  . Orthostatic hypotension 03/08/2008  . CAD, NATIVE VESSEL 03/08/2008    Willow Ora, PTA, [facility] [address] [place], [place] [address] [phone] 10/09/19, 10:39 AM   Name: MALLORY ENRIQUES MRN: [id number] Date of Birth: October 07, 1933

## 2019-10-09 NOTE — Telephone Encounter (Signed)
[name] had fluid removed from his lung on Monday.  He had pain in his forehead on left side after that, and has had some confusion.  She is wondering if he should have CT before the 20th since he will not follow up with [facility] until 11/05/19.  She does not want things to escalate like they did with the lung.  Please advise.  # [phone].

## 2019-10-09 NOTE — Telephone Encounter (Signed)
I think that is reasonable and will put in the CT Head order for him, thank you!

## 2019-10-09 NOTE — Telephone Encounter (Signed)
Notified. 

## 2019-10-10 LAB — CULTURE, BODY FLUID W GRAM STAIN -BOTTLE: Culture: NO GROWTH

## 2019-10-13 DIAGNOSIS — S42032D Displaced fracture of lateral end of left clavicle, subsequent encounter for fracture with routine healing: Secondary | ICD-10-CM | POA: Diagnosis not present

## 2019-10-13 DIAGNOSIS — S63105D Unspecified dislocation of left thumb, subsequent encounter: Secondary | ICD-10-CM | POA: Diagnosis not present

## 2019-10-14 ENCOUNTER — Encounter: Payer: Self-pay | Admitting: Occupational Therapy

## 2019-10-14 ENCOUNTER — Ambulatory Visit: Payer: PPO

## 2019-10-14 ENCOUNTER — Other Ambulatory Visit: Payer: Self-pay

## 2019-10-14 ENCOUNTER — Ambulatory Visit: Payer: PPO | Admitting: Occupational Therapy

## 2019-10-14 DIAGNOSIS — R2681 Unsteadiness on feet: Secondary | ICD-10-CM

## 2019-10-14 DIAGNOSIS — R29818 Other symptoms and signs involving the nervous system: Secondary | ICD-10-CM

## 2019-10-14 DIAGNOSIS — M79642 Pain in left hand: Secondary | ICD-10-CM

## 2019-10-14 DIAGNOSIS — M25632 Stiffness of left wrist, not elsewhere classified: Secondary | ICD-10-CM

## 2019-10-14 DIAGNOSIS — R41840 Attention and concentration deficit: Secondary | ICD-10-CM

## 2019-10-14 DIAGNOSIS — M6281 Muscle weakness (generalized): Secondary | ICD-10-CM

## 2019-10-14 DIAGNOSIS — R2689 Other abnormalities of gait and mobility: Secondary | ICD-10-CM

## 2019-10-14 DIAGNOSIS — M25642 Stiffness of left hand, not elsewhere classified: Secondary | ICD-10-CM

## 2019-10-14 DIAGNOSIS — R262 Difficulty in walking, not elsewhere classified: Secondary | ICD-10-CM

## 2019-10-14 DIAGNOSIS — R41841 Cognitive communication deficit: Secondary | ICD-10-CM

## 2019-10-14 NOTE — Patient Instructions (Signed)
Access Code: [email or web address] URL: [email or web address] Date: 10/14/2019 Prepared by: Baldomero Lamy  Exercises Side Stepping with Counter Support - 1 x daily - 5 x weekly - 1 sets - 3 reps Walking March - 1 x daily - 5 x weekly - 1 sets - 3 reps Tandem Walking with Counter Support - 1 x daily - 5 x weekly - 1 sets - 3 reps Heel Toe Raises with Counter Support - 1 x daily - 5 x weekly - 1 sets - 10 reps Step Up - 1 x daily - 5 x weekly - 1 sets - 10 reps Backward Walking with Counter Support - 1 x daily - 5 x weekly - 3 sets - 10 reps

## 2019-10-14 NOTE — Therapy (Signed)
[facility] [address] [place], [place], [address] Phone: [phone]   Fax:  [phone]  Occupational Therapy Treatment  Patient Details  Name: [name] MRN: [id number] Date of Birth: 07-18-1933 Referring Provider (OT): Reesa Chew   Encounter Date: 10/14/2019   OT End of Session - 10/14/19 1722    Visit Number 12    Number of Visits 17    Date for OT Re-Evaluation 10/27/19    Authorization Type [employer] - Follow Medicare guidelines    Authorization - Visit Number 12    Authorization - Number of Visits 20    Progress Note Due on Visit 20    OT Start Time 7591    OT Stop Time 1530    OT Time Calculation (min) 45 min    Activity Tolerance Patient tolerated treatment well    Behavior During Therapy [facility] for tasks assessed/performed;Impulsive           Past Medical History:  Diagnosis Date  . Allergy   . BPH (benign prostatic hyperplasia)   . BPH (benign prostatic hypertrophy)   . CAD (coronary artery disease)    s/p cypher DES to pLAD 6/08; normal LVF;  ETT-Myoview 2009: no ischemia   . Coronary artery disease   . Eosinophilia   . High cholesterol   . HTN (hypertension)   . Hyperlipidemia   . Hypertension   . MI (myocardial infarction) ([place])   . Myocardial infarction ([place])   . Prediabetes   . Trigeminal neuralgia     Past Surgical History:  Procedure Laterality Date  . APPENDECTOMY    . CARDIAC CATHETERIZATION  07/30/2006   CORONARY ANGIOPLASTY WITH STENT PLACEMENT  . CARDIAC CATHETERIZATION    . EXPLORATORY LAPAROTOMY     age 47  . EXPLORATORY LAPAROTOMY    . IR THORACENTESIS ASP PLEURAL SPACE W/IMG GUIDE  10/05/2019    There were no vitals filed for this visit.   Subjective Assessment - 10/14/19 1713    Subjective  I have no plan to drive anytime soon    Patient is accompanied by: Family member    Limitations NWB LUE, Thumb splint    Currently in Pain? No/denies    Pain  Score 0-No pain                        OT Treatments/Exercises (OP) - 10/14/19 1715      ADLs   Driving Patient able to articualte that at times he feels tired of brain work.  Wife verbalizing that he needs help organizing self in familiar tasks - in familiar environment (e.g. cooking)  Overtly discussed driving, as this wa a goal patient verbalized initially.  Recommended that patient wait to consider driving, as he is still not feeling mentally clear enough to do so.  Patient verbalizing complete agreement.  Wife agrees as well.  Did discuss that once he got clearance to attempt to drive - he might try operating vehicle in an empty lot.          Hand Exercises   Other Hand Exercises Working on in hand manipulation specifically opposition.        LUE Fluidotherapy   Number Minutes Fluidotherapy 10 Minutes    LUE Fluidotherapy Location Hand;Wrist    Comments Patient with improved flexibility in left thumb following heat                  OT Education -  10/14/19 1721    Education Details Discussed recommendations for driving - to hold off as patient still with decreased selective attention and decreased memory    Person(s) Educated Patient;Spouse    Methods Explanation    Comprehension Verbalized understanding            OT Short Term Goals - 10/14/19 1725      OT SHORT TERM GOAL #1   Title Patient will complete an HEP designed to improve left thumb range of motion with min cueing due 09/27/19    Time 4    Period Weeks    Status Achieved    Target Date 09/27/19      OT SHORT TERM GOAL #2   Title Patient will complete a home exercise program designed to improve coordination in left hand with set up assistance    Time 4    Period Weeks    Status Achieved      OT SHORT TERM GOAL #3   Title Patient will don/doff a pull over shirt with set up assistance    Time 4    Period Weeks    Status Achieved      OT SHORT TERM GOAL #4   Title Patient will  demonstrate low reach to obtain a lightweight (less than 3 lb) object with left hand    Time 4    Period Weeks    Status Achieved             OT Long Term Goals - 10/14/19 1725      OT LONG TERM GOAL #1   Title Patient will complete HEP/Home activities program to improve functional mobility and use of left hand due 9/21    Time 8    Period Weeks    Status On-going      OT LONG TERM GOAL #2   Title Patient will bathe and dress himself with modified independence - with increased time    Status Achieved      OT LONG TERM GOAL #3   Title Patient will prepare a familiar hot meal using left hand as non dominant with supervision    Period Weeks    Status Achieved      OT LONG TERM GOAL #4   Title Patient will demonstrate ability to manipulate small items within left hand as evidenced by his ability to complete 9 hole peg test in less than 1 minute    Time 8    Period Weeks    Status Achieved      OT LONG TERM GOAL #5   Title Patient will understand recommendations related to driving    Time 8    Period Weeks    Status On-going                 Plan - 10/14/19 1723    Clinical Impression Statement Patient shows steady improvement with functional use of Left hand, needs improved range in left shoulder (from clavicle fracture), and continued cognitive challenge to address selective attention in functional situations.    OT Occupational Profile and History Detailed Assessment- Review of Records and additional review of physical, cognitive, psychosocial history related to current functional performance    Occupational performance deficits (Please refer to evaluation for details): ADL's;IADL's;Leisure;Rest and Sleep    Body Structure / Function / Physical Skills ADL;Coordination;Endurance;GMC;UE functional use;Sensation;Decreased knowledge of precautions;Balance;Body mechanics;Decreased knowledge of use of DME;Flexibility;IADL;Pain;Strength;FMC;Dexterity;Cardiopulmonary status  limiting activity;ROM;Mobility;Tone    Cognitive Skills Attention;Thought;Problem Solve;Safety Awareness;Memory;Energy/Drive  Rehab Potential Excellent    Clinical Decision Making Several treatment options, min-mod task modification necessary    Comorbidities Affecting Occupational Performance: May have comorbidities impacting occupational performance    Modification or Assistance to Complete Evaluation  Min-Moderate modification of tasks or assist with assess necessary to complete eval    OT Frequency 2x / week    OT Duration 8 weeks    OT Treatment/Interventions Self-care/ADL training;Electrical Stimulation;Iontophoresis;Therapeutic exercise;Moist Heat;Paraffin;Neuromuscular education;Splinting;Patient/family education;Balance training;Therapeutic activities;Functional Mobility Training;Fluidtherapy;Cryotherapy;Ultrasound;DME and/or AE instruction;Manual Therapy;Passive range of motion;Cognitive remediation/compensation    Plan Left shoulder range of motion, Left hand function, selective attention    OT Home Exercise Plan Has HEP from CIR for shoulder and thumb (L)    Consulted and Agree with Plan of Care Patient;Family member/caregiver    Family Member Consulted WIFE           Patient will benefit from skilled therapeutic intervention in order to improve the following deficits and impairments:   Body Structure / Function / Physical Skills: ADL, Coordination, Endurance, GMC, UE functional use, Sensation, Decreased knowledge of precautions, Balance, Body mechanics, Decreased knowledge of use of DME, Flexibility, IADL, Pain, Strength, FMC, Dexterity, Cardiopulmonary status limiting activity, ROM, Mobility, Tone Cognitive Skills: Attention, Thought, Problem Solve, Safety Awareness, Memory, Energy/Drive     Visit Diagnosis: Pain in left hand  Stiffness of left hand, not elsewhere classified  Attention and concentration deficit  Muscle weakness (generalized)  Other symptoms and signs  involving the nervous system  Unsteadiness on feet  Stiffness of left wrist, not elsewhere classified    Problem List Patient Active Problem List   Diagnosis Date Noted  . Fracture of multiple ribs with pain 08/10/2019  . Clavicle fracture 08/10/2019  . Closed displaced fracture of phalanx of left thumb, sequela 08/10/2019  . TBI (traumatic brain injury) ([place]) 07/23/2019  . Traumatic closed fracture of distal clavicle with minimal displacement, left, initial encounter 07/19/2019  . Traumatic brain injury with loss of consciousness ([place])   . Multiple trauma   . Benign prostatic hyperplasia   . Essential hypertension   . AKI (acute kidney injury) ([place])   . Stage 3b chronic kidney disease   . Prediabetes   . Acute blood loss anemia   . Thrombocytopenia ([place])   . Fall 07/14/2019  . Type 2 diabetes mellitus ([place])   . Eosinophilia   . Need for prophylactic vaccination and inoculation against influenza 11/12/2013  . Nocturnal leg cramps 11/23/2012  . Pain in joint, shoulder region 11/23/2012  . Fall 11/04/2012  . Hematoma 11/04/2012  . Acute upper respiratory infections of unspecified site 11/04/2012  . HYPERLIPIDEMIA TYPE IIB / III 03/08/2008  . Orthostatic hypotension 03/08/2008  . CAD, NATIVE VESSEL 03/08/2008    Mariah Milling, OTR/L 10/14/2019, 5:27 PM  [facility] [address] [place], [place], [address] Phone: [phone]   Fax:  [phone]  Name: [name] MRN: [id number] Date of Birth: 1933-08-17

## 2019-10-14 NOTE — Patient Instructions (Signed)
  Please complete the assigned speech therapy homework prior to your next session and return it to the speech therapist at your next visit.  

## 2019-10-14 NOTE — Therapy (Signed)
[facility] [address] [place], [place], [address] Phone: [phone]   Fax:  [phone]  Speech Language Pathology Treatment  Patient Details  Name: [name] MRN: [id number] Date of Birth: Jan 28, 1934 Referring Provider (SLP): Reesa Chew, PA-C   Encounter Date: 10/14/2019   End of Session - 10/14/19 1640    Visit Number 12    Number of Visits 17    Date for SLP Re-Evaluation 11/16/19    SLP Start Time 7622    SLP Stop Time  1445    SLP Time Calculation (min) 40 min    Activity Tolerance Patient tolerated treatment well           Past Medical History:  Diagnosis Date  . Allergy   . BPH (benign prostatic hyperplasia)   . BPH (benign prostatic hypertrophy)   . CAD (coronary artery disease)    s/p cypher DES to pLAD 6/08; normal LVF;  ETT-Myoview 2009: no ischemia   . Coronary artery disease   . Eosinophilia   . High cholesterol   . HTN (hypertension)   . Hyperlipidemia   . Hypertension   . MI (myocardial infarction) ([place])   . Myocardial infarction ([place])   . Prediabetes   . Trigeminal neuralgia     Past Surgical History:  Procedure Laterality Date  . APPENDECTOMY    . CARDIAC CATHETERIZATION  07/30/2006   CORONARY ANGIOPLASTY WITH STENT PLACEMENT  . CARDIAC CATHETERIZATION    . EXPLORATORY LAPAROTOMY     age 86  . EXPLORATORY LAPAROTOMY    . IR THORACENTESIS ASP PLEURAL SPACE W/IMG GUIDE  10/05/2019    There were no vitals filed for this visit.   Subjective Assessment - 10/14/19 1403    Subjective KAren attended with pt.    Currently in Pain? No/denies                 ADULT SLP TREATMENT - 10/14/19 1634      General Information   Behavior/Cognition Alert;Cooperative;Pleasant mood;Impulsive;Requires cueing      Treatment Provided   Treatment provided Cognitive-Linquistic      Cognitive-Linquistic Treatment   Treatment focused on Cognition;Patient/family/caregiver  education    Skilled Treatment Pt req'd consistent max cues faded to mod cues to navigate to this week's planning sheet, and once there req'd mod -max cues to find today. SLP checked pt's homework with him reading answers. Pt missed answering one question, total correct 8/10. Max cues needed for pt's mental shifting between minutes and amount of money on parking meter task. Pt improved from max cues to mod cues for where to file homework completed.      Assessment / Recommendations / Plan   Plan Continue with current plan of care      Progression Toward Goals   Progression toward goals Progressing toward goals              SLP Short Term Goals - 10/08/19 0947      SLP SHORT TERM GOAL #1   Title pt will demo knowledge of his memory copmensation system by attempt to use with rare min cues in 2 sessions    Baseline 09-16-19    Status Partially Met      SLP SHORT TERM GOAL #2   Title pt will demo correct/successful orientation to a memory compensation system in 3 sessions    Status Not Met      SLP SHORT TERM GOAL #3   Title pt will  tell SLP 2 cognitive deficits independently, and how they may impact him at home with rare min A in 3 sessions    Status Not Met      SLP SHORT TERM GOAL #4   Title pt will undergo further cognitive linguisitic testing PRN    Period --   or 7 total sessions   Status Achieved      SLP SHORT TERM GOAL #5   Title Patient will use compensations for attention during functional task (eg conversation or reading on topic of interest) with min cues x 2 sessions.    Status Not Met            SLP Long Term Goals - 10/14/19 1641      SLP LONG TERM GOAL #1   Title pt will demo correct usage of a memory compensation system for appointment and/or medication management, to-do lists, daily schedules in 3 sessions    Time 2    Period Weeks   or 17 total sessions, for all LTGs   Status On-going      SLP LONG TERM GOAL #2   Title pt will demo emergent awareness  by ID'ing 75% of errors in cognitive linguistic tasks with double-checking x 2 sessions.    Time 2    Period Weeks    Status Revised      SLP LONG TERM GOAL #3   Title Pt will maintain selective attention in functional task for 8 minutes in mod noisy environment with rare min cues x 2 sessions.    Time 2    Period Weeks    Status On-going            Plan - 10/14/19 1641    Clinical Impression Statement Pt presents today with moderate-severe cognitive communication deficits; most significantly in attention and memory. Impulsivity, decreased awareness noted. Pt did not meet most of his STGs; downgraded goal for awareness. Other LTGs to be modified PRN. Pt cont to require usual mod-max cues for where to look in memory book. Continue skilled ST to increase pt independence with schedule and daily tasks.    Speech Therapy Frequency 2x / week    Duration --   8 weeks or 17 total sessions   Treatment/Interventions Language facilitation;Cueing hierarchy;Cognitive reorganization;Internal/external aids;Patient/family education;Compensatory strategies;SLP instruction and feedback;Functional tasks;Environmental controls    Potential to Achieve Goals Good    Potential Considerations Severity of impairments;Ability to learn/carryover information    Consulted and Agree with Plan of Care Patient           Patient will benefit from skilled therapeutic intervention in order to improve the following deficits and impairments:   Cognitive communication deficit    Problem List Patient Active Problem List   Diagnosis Date Noted  . Fracture of multiple ribs with pain 08/10/2019  . Clavicle fracture 08/10/2019  . Closed displaced fracture of phalanx of left thumb, sequela 08/10/2019  . TBI (traumatic brain injury) ([place]) 07/23/2019  . Traumatic closed fracture of distal clavicle with minimal displacement, left, initial encounter 07/19/2019  . Traumatic brain injury with loss of consciousness ([place])   .  Multiple trauma   . Benign prostatic hyperplasia   . Essential hypertension   . AKI (acute kidney injury) ([place])   . Stage 3b chronic kidney disease   . Prediabetes   . Acute blood loss anemia   . Thrombocytopenia ([place])   . Fall 07/14/2019  . Type 2 diabetes mellitus ([place])   . Eosinophilia   .  Need for prophylactic vaccination and inoculation against influenza 11/12/2013  . Nocturnal leg cramps 11/23/2012  . Pain in joint, shoulder region 11/23/2012  . Fall 11/04/2012  . Hematoma 11/04/2012  . Acute upper respiratory infections of unspecified site 11/04/2012  . HYPERLIPIDEMIA TYPE IIB / III 03/08/2008  . Orthostatic hypotension 03/08/2008  . CAD, NATIVE VESSEL 03/08/2008    [facility] ,MS, [place]  10/14/2019, 4:42 PM  [facility] [address] [place], [place], [address] Phone: [phone]   Fax:  [phone]   Name: [name] MRN: [id number] Date of Birth: [birth year]

## 2019-10-14 NOTE — Therapy (Signed)
[facility] [address] [place], [place], [address] Phone: [phone]   Fax:  [phone]  Physical Therapy Treatment/Re-Certification  Patient Details  Name: [name] MRN: [id number] Date of Birth: 1933-09-27 Referring Provider (PT): Reesa Chew PA-C   Encounter Date: 10/14/2019   PT End of Session - 10/14/19 1534    Visit Number 13    Number of Visits 17    Date for PT Re-Evaluation 12/13/19   Updated POC for 4 weeks, Cert for 60 days   Authorization Type [employer] P[employer] - $15 co pay, collect 1 co pay per day for multiple disciplines    PT Start Time 1532    PT Stop Time 1615    PT Time Calculation (min) 43 min    Equipment Utilized During Treatment Gait belt    Activity Tolerance Patient tolerated treatment well    Behavior During Therapy [facility] for tasks assessed/performed;Impulsive           Past Medical History:  Diagnosis Date  . Allergy   . BPH (benign prostatic hyperplasia)   . BPH (benign prostatic hypertrophy)   . CAD (coronary artery disease)    s/p cypher DES to pLAD 6/08; normal LVF;  ETT-Myoview 2009: no ischemia   . Coronary artery disease   . Eosinophilia   . High cholesterol   . HTN (hypertension)   . Hyperlipidemia   . Hypertension   . MI (myocardial infarction) ([place])   . Myocardial infarction ([place])   . Prediabetes   . Trigeminal neuralgia     Past Surgical History:  Procedure Laterality Date  . APPENDECTOMY    . CARDIAC CATHETERIZATION  07/30/2006   CORONARY ANGIOPLASTY WITH STENT PLACEMENT  . CARDIAC CATHETERIZATION    . EXPLORATORY LAPAROTOMY     age 7  . EXPLORATORY LAPAROTOMY    . IR THORACENTESIS ASP PLEURAL SPACE W/IMG GUIDE  10/05/2019    There were no vitals filed for this visit.   Subjective Assessment - 10/14/19 1536    Subjective No new changes/complaints. Wife reports they have rescheduled CT scan. No pain. Near stumble in the house.    Patient  is accompained by: Family member    Pertinent History PMH: BPH, CAD, HTN, CKD III, prediabetes, mild cognitive impairment.    Patient Stated Goals wants to walk without worrying about potentially having a dizzy spell - reports 4-5 spells a day that lasts only a couple of seconds.    Currently in Pain? No/denies    Pain Onset More than a month ago                 [facility] PT Assessment - 10/14/19 0001      Functional Gait  Assessment   Gait assessed  Yes    Gait Level Surface Walks 20 ft in less than 7 sec but greater than 5.5 sec, uses assistive device, slower speed, mild gait deviations, or deviates 6-10 in outside of the 12 in walkway width.    Change in Gait Speed Able to smoothly change walking speed without loss of balance or gait deviation. Deviate no more than 6 in outside of the 12 in walkway width.    Gait with Horizontal Head Turns Performs head turns smoothly with no change in gait. Deviates no more than 6 in outside 12 in walkway width    Gait with Vertical Head Turns Performs head turns with no change in gait. Deviates no more than 6 in outside  12 in walkway width.    Gait and Pivot Turn Pivot turns safely within 3 sec and stops quickly with no loss of balance.    Step Over Obstacle Is able to step over one shoe box (4.5 in total height) without changing gait speed. No evidence of imbalance.    Gait with Narrow Base of Support Ambulates 4-7 steps.    Gait with Eyes Closed Cannot walk 20 ft without assistance, severe gait deviations or imbalance, deviates greater than 15 in outside 12 in walkway width or will not attempt task.    Ambulating Backwards Walks 20 ft, slow speed, abnormal gait pattern, evidence for imbalance, deviates 10-15 in outside 12 in walkway width.    Steps Alternating feet, must use rail.    Total Score 20    FGA comment: 20/30 = Medium Fall Risk                         [facility] Adult PT Treatment/Exercise - 10/14/19 0001      Transfers    Transfers Sit to Stand;Stand to Sit    Sit to Stand 5: Supervision    Stand to Sit 5: Supervision      Ambulation/Gait   Ambulation/Gait Yes    Ambulation/Gait Assistance 5: Supervision    Ambulation/Gait Assistance Details completed ambulation without walking stick, scuffing of feet noted.     Ambulation Distance (Feet) --   clinic distances around gym   Gait Pattern Step-through pattern;Decreased stride length;Decreased arm swing - left    Ambulation Surface Level;Indoor      Neuro Re-ed    Neuro Re-ed Details  Completed M-CTSIB: Patient able to complete situation 1 + 2 for full 30 seconds, situation 3: 26s on average, situation 4: <5 seconds on average. Increased difficulty and sway noted with situation 4.       Exercises   Exercises Other Exercises    Other Exercises  Updated HEP to included backwards walking at countertop with light UE support as needed.              Access Code: [email or web address] URL: [email or web address] Date: 10/14/2019 Prepared by: Baldomero Lamy  Exercises Side Stepping with Counter Support - 1 x daily - 5 x weekly - 1 sets - 3 reps Walking March - 1 x daily - 5 x weekly - 1 sets - 3 reps Tandem Walking with Counter Support - 1 x daily - 5 x weekly - 1 sets - 3 reps Heel Toe Raises with Counter Support - 1 x daily - 5 x weekly - 1 sets - 10 reps Step Up - 1 x daily - 5 x weekly - 1 sets - 10 reps Backward Walking with Counter Support - 1 x daily - 5 x weekly - 3 sets - 10 reps       PT Education - 10/14/19 1617    Education Details Educated on FGA/M-CTSIB, Updated POC. HEP update    Person(s) Educated Patient;Spouse    Methods Explanation;Demonstration;Handout    Comprehension Verbalized understanding;Returned demonstration;Need further instruction            PT Short Term Goals - 09/10/19 1020      PT SHORT TERM GOAL #1   Title Pt will be independent with initial HEP in order to build upon functional gains made in therapy. 09/14/19     Baseline 09/10/19: met with current program which spouse/pt report are still challenging.    Status Achieved  Target Date 09/14/19      PT SHORT TERM GOAL #2   Title Pt will improve DGI score to at least a 14/24 in order to demo decr fall risk.    Baseline 09/10/19: 18/24 scored today    Status Achieved      PT SHORT TERM GOAL #3   Title Pt and pt's spouse will verbalize understanding of fall prevention in the home.    Baseline 09/10/19: met in session today    Time --    Period --    Status Achieved      PT SHORT TERM GOAL #4   Title Stairs to be assessed as appropriate - LTG to be written as appropriate.    Baseline 09/03/19: min guard assist with single rail sideways for 4 stairs x 4 reps in session today, step to pattern.    Status Achieved      PT SHORT TERM GOAL #5   Title Pt will perform 5x sit <> stand with no BLE bracing against mat table/chair and improved eccentric control in order  to demo improved safety with transfers.    Baseline 09/10/19: met in session today with time of 9.32 sec's with no UE assist, no retropulsion    Time --    Period --    Status Achieved             PT Long Term Goals - 10/14/19 1621      PT LONG TERM GOAL #1   Title Pt will be independent with final HEP in order to build upon functional gains made in therapy. ALL LTGS DUE 10/14/19    Baseline continue to progress HEP at this time    Time 8    Period Weeks    Status On-going      PT LONG TERM GOAL #2   Title Pt will perform at least 12 steps with step to pattern vs. step through pattern with single handrail with supervision in order to safely enter/exit home and go to 2nd floor.    Baseline 10/08/19: met in session today    Status Achieved      PT LONG TERM GOAL #3   Title Pt will improve DGI score to at least a 17/24 in order to demo decr fall risk.    Baseline 10/08/19: 22/24 scored today    Status Achieved      PT LONG TERM GOAL #4   Title Pt will perform TUG with no AD vs. LRAD in  13.5 seconds or less in order to demo decr fall risk.    Baseline 10/08/19: 11.34 sec's no AD    Status Achieved      PT LONG TERM GOAL #5   Title Pt will ambulate at least 500' with supervision over level indoor and unlevel outdoor surfaces in order to demo improved community mobility.    Baseline 10/08/19: met in session today with walking stick.    Status Achieved               PT Short Term Goals - 10/14/19 1625      PT SHORT TERM GOAL #1   Title STG = LTG           PT Long Term Goals - 10/14/19 1621      PT LONG TERM GOAL #1   Title Pt will be independent with final HEP in order to build upon functional gains made in therapy. ALL LTGS DUE 10/14/19    Baseline continue  to progress HEP at this time    Time 4    Period Weeks    Status On-going    Target Date 11/11/19      PT LONG TERM GOAL #2   Title Patient will improve FGA to >/= 24/30 to demonstrate improved balance and reduced fall risk    Baseline 20/30    Time 4    Period Weeks    Status New      PT LONG TERM GOAL #3   Title Patient will be able to hold situation 3 of M-CSTIB for full 30 seconds, and situation 4 for >/= 15 seconds to demonstrate improved balance    Baseline situation 3: 26 seconds, situation 4: </= 5 seconds    Time 4    Period Weeks    Status New      PT LONG TERM GOAL #4   Title --    Baseline --    Status --      PT LONG TERM GOAL #5   Title --    Baseline --    Status --               Plan - 10/14/19 1622    Clinical Impression Statement Patient has demonstrated ability to meet all LTG at this time.Today's skilled PT session included further balance assessment with FGA, patient scoring 20/30 demonstrating medium risk for falls. With M-CSTIB, patient unable to hold situation 3 + 4 for full 30 seconds. Patient will continue to benefit from skilled PT services to further improve balance, reduce fall risk, and mazimize functional mobility.    Personal Factors and Comorbidities  Comorbidity 3+;Past/Current Experience;Behavior Pattern    Comorbidities BPH, CAD, HTN, CKD III, prediabetes, mild cognitive impairment,    Examination-Activity Limitations Transfers;Stairs;Locomotion Level    Examination-Participation Restrictions Community Activity    Stability/Clinical Decision Making Evolving/Moderate complexity    Rehab Potential Good    PT Frequency 1x / week    PT Duration 4 weeks    PT Treatment/Interventions ADLs/Self Care Home Management;DME Instruction;Stair training;Therapeutic activities;Functional mobility training;Gait training;Therapeutic exercise;Balance training;Neuromuscular re-education;Patient/family education;Vestibular    PT Next Visit Plan Complete Review of HEP + Update Exercises (add in resistance band).    PT Home Exercise Plan Access Code: [email or web address]    Consulted and Agree with Plan of Care Patient;Family member/caregiver    Family Member Consulted pt's spouse           Patient will benefit from skilled therapeutic intervention in order to improve the following deficits and impairments:  Abnormal gait, Decreased balance, Decreased activity tolerance, Decreased coordination, Decreased knowledge of use of DME, Decreased safety awareness, Decreased strength, Difficulty walking, Dizziness, Impaired UE functional use  Visit Diagnosis: Muscle weakness (generalized)  Other symptoms and signs involving the nervous system  Unsteadiness on feet  Difficulty in walking, not elsewhere classified  Other abnormalities of gait and mobility     Problem List Patient Active Problem List   Diagnosis Date Noted  . Fracture of multiple ribs with pain 08/10/2019  . Clavicle fracture 08/10/2019  . Closed displaced fracture of phalanx of left thumb, sequela 08/10/2019  . TBI (traumatic brain injury) ([place]) 07/23/2019  . Traumatic closed fracture of distal clavicle with minimal displacement, left, initial encounter 07/19/2019  . Traumatic brain injury with  loss of consciousness ([place])   . Multiple trauma   . Benign prostatic hyperplasia   . Essential hypertension   . AKI (acute kidney injury) ([place])   . Stage  3b chronic kidney disease   . Prediabetes   . Acute blood loss anemia   . Thrombocytopenia ([place])   . Fall 07/14/2019  . Type 2 diabetes mellitus ([place])   . Eosinophilia   . Need for prophylactic vaccination and inoculation against influenza 11/12/2013  . Nocturnal leg cramps 11/23/2012  . Pain in joint, shoulder region 11/23/2012  . Fall 11/04/2012  . Hematoma 11/04/2012  . Acute upper respiratory infections of unspecified site 11/04/2012  . HYPERLIPIDEMIA TYPE IIB / III 03/08/2008  . Orthostatic hypotension 03/08/2008  . CAD, NATIVE VESSEL 03/08/2008    Jones Bales, PT, DPT 10/14/2019, 4:24 PM  [facility] [address] [place], [place], [address] Phone: [phone]   Fax:  [phone]  Name: TRAVANTI MCMANUS MRN: [id number] Date of Birth: 25-Oct-1933

## 2019-10-15 ENCOUNTER — Other Ambulatory Visit: Payer: Self-pay | Admitting: Physical Medicine and Rehabilitation

## 2019-10-16 ENCOUNTER — Other Ambulatory Visit: Payer: Self-pay

## 2019-10-16 ENCOUNTER — Ambulatory Visit (HOSPITAL_COMMUNITY): Payer: PPO | Attending: Cardiovascular Disease

## 2019-10-16 DIAGNOSIS — R0602 Shortness of breath: Secondary | ICD-10-CM | POA: Diagnosis not present

## 2019-10-16 LAB — ECHOCARDIOGRAM COMPLETE
Area-P 1/2: 2.42 cm2
S' Lateral: 2.7 cm

## 2019-10-20 ENCOUNTER — Other Ambulatory Visit: Payer: Self-pay

## 2019-10-20 ENCOUNTER — Ambulatory Visit: Payer: PPO | Admitting: Occupational Therapy

## 2019-10-20 ENCOUNTER — Ambulatory Visit: Payer: PPO | Admitting: Speech Pathology

## 2019-10-20 ENCOUNTER — Ambulatory Visit: Payer: PPO | Admitting: Physical Therapy

## 2019-10-20 ENCOUNTER — Encounter: Payer: Self-pay | Admitting: Occupational Therapy

## 2019-10-20 DIAGNOSIS — R29818 Other symptoms and signs involving the nervous system: Secondary | ICD-10-CM

## 2019-10-20 DIAGNOSIS — M79642 Pain in left hand: Secondary | ICD-10-CM

## 2019-10-20 DIAGNOSIS — M25642 Stiffness of left hand, not elsewhere classified: Secondary | ICD-10-CM

## 2019-10-20 DIAGNOSIS — R41841 Cognitive communication deficit: Secondary | ICD-10-CM

## 2019-10-20 DIAGNOSIS — R262 Difficulty in walking, not elsewhere classified: Secondary | ICD-10-CM

## 2019-10-20 DIAGNOSIS — R2681 Unsteadiness on feet: Secondary | ICD-10-CM

## 2019-10-20 DIAGNOSIS — M6281 Muscle weakness (generalized): Secondary | ICD-10-CM | POA: Diagnosis not present

## 2019-10-20 DIAGNOSIS — R41840 Attention and concentration deficit: Secondary | ICD-10-CM

## 2019-10-20 DIAGNOSIS — M25632 Stiffness of left wrist, not elsewhere classified: Secondary | ICD-10-CM

## 2019-10-20 DIAGNOSIS — R2689 Other abnormalities of gait and mobility: Secondary | ICD-10-CM

## 2019-10-20 NOTE — Therapy (Signed)
[facility] [address] [place], [place], [address] Phone: [phone]   Fax:  [phone]  Physical Therapy Treatment  Patient Details  Name: [name] MRN: [id number] Date of Birth: Feb 03, 1934 Referring Provider (PT): Reesa Chew PA-C   Encounter Date: 10/20/2019   PT End of Session - 10/20/19 1533    Visit Number 14    Number of Visits 17    Date for PT Re-Evaluation 12/13/19   Updated POC for 4 weeks, Cert for 60 days   Authorization Type [employer] P[employer] - $15 co pay, collect 1 co pay per day for multiple disciplines    PT Start Time 9323    PT Stop Time 1530    PT Time Calculation (min) 43 min    Equipment Utilized During Treatment Gait belt    Activity Tolerance Patient tolerated treatment well    Behavior During Therapy [facility] for tasks assessed/performed;Impulsive           Past Medical History:  Diagnosis Date  . Allergy   . BPH (benign prostatic hyperplasia)   . BPH (benign prostatic hypertrophy)   . CAD (coronary artery disease)    s/p cypher DES to pLAD 6/08; normal LVF;  ETT-Myoview 2009: no ischemia   . Coronary artery disease   . Eosinophilia   . High cholesterol   . HTN (hypertension)   . Hyperlipidemia   . Hypertension   . MI (myocardial infarction) ([place])   . Myocardial infarction ([place])   . Prediabetes   . Trigeminal neuralgia     Past Surgical History:  Procedure Laterality Date  . APPENDECTOMY    . CARDIAC CATHETERIZATION  07/30/2006   CORONARY ANGIOPLASTY WITH STENT PLACEMENT  . CARDIAC CATHETERIZATION    . EXPLORATORY LAPAROTOMY     age 86  . EXPLORATORY LAPAROTOMY    . IR THORACENTESIS ASP PLEURAL SPACE W/IMG GUIDE  10/05/2019    There were no vitals filed for this visit.   Subjective Assessment - 10/20/19 1450    Subjective No new changes. No falls. Doing well.    Patient is accompained by: Family member    Pertinent History PMH: BPH, CAD, HTN, CKD III,  prediabetes, mild cognitive impairment.    Patient Stated Goals wants to walk without worrying about potentially having a dizzy spell - reports 4-5 spells a day that lasts only a couple of seconds.    Currently in Pain? No/denies    Pain Onset More than a month ago                   Access Code: [email or web address] URL: [email or web address] Date: 10/20/2019 Prepared by: Janann August  Exercises Walking March - 1 x daily - 5 x weekly - 1 sets - 3 reps - upgraded for HEP with use of green tband  Tandem Walking with Counter Support - 1 x daily - 5 x weekly - 1 sets - 3 reps Heel Toe Raises with Counter Support - 1 x daily - 5 x weekly - 1 sets - 10 reps Step Up - 1 x daily - 5 x weekly - 1 sets - 10 reps Backward Walking with Counter Support - 1 x daily - 5 x weekly - 3 sets - 10 reps - reviewed from previous HEP, cues for technique and weight shift  Side Stepping with Resistance at Thighs and Counter Support - 1 x daily - 5 x weekly - 1 sets - 3 reps -upgraded exercise for  HEP to include green tband            [facility] Adult PT Treatment/Exercise - 10/20/19 0001      Ambulation/Gait   Ambulation/Gait Yes    Ambulation/Gait Assistance 5: Supervision    Ambulation/Gait Assistance Details between activities with no AD - no LOB, just intermittent R foot scuffing at times               Balance Exercises - 10/20/19 1517      Balance Exercises: Standing   Standing Eyes Closed Narrow base of support (BOS);Wide (BOA);Head turns;Foam/compliant surface;Other reps (comment);30 secs;Limitations    Standing Eyes Closed Limitations on airex pad, more narrow BOS but not feet touching, able to hold 2 x 20 seconds and then 2 x 30 seconds    SLS Eyes open;Foam/compliant surface;Limitations    SLS Limitations next to countertop with single UE support: stepping onto blue disc and then tapping single cone x10 reps B, then performed forward tap and then cross cone tap x5 reps B,  incr difficulty with SLS with RLE      Partial Tandem Stance Eyes open;Foam/compliant surface    Partial Tandem Stance Limitations performing 2 x 5 reps head turns B, needing frequent UE support for balance    Other Standing Exercises On incline: feet together standing up incline eyes closed 3 x 30 seconds, marching with eyes open x12 reps cues for slowed and controlled, with eyes closed marching x10 reps B. standing up incline: x10 reps alternating posterior stepping strategy - cues for incr step length B and weight shift. Standing down incline: alternating forward stepping strategy x10 reps - min guard for balance.              PT Education - 10/20/19 1533    Education Details added green resistance to standing marching and side stepping for HEP    Person(s) Educated Patient;Spouse    Methods Explanation;Demonstration;Verbal cues    Comprehension Verbalized understanding;Returned demonstration            PT Short Term Goals - 10/14/19 1625      PT SHORT TERM GOAL #1   Title STG = LTG             PT Long Term Goals - 10/14/19 1621      PT LONG TERM GOAL #1   Title Pt will be independent with final HEP in order to build upon functional gains made in therapy. ALL LTGS DUE 10/14/19    Baseline continue to progress HEP at this time    Time 4    Period Weeks    Status On-going    Target Date 11/11/19      PT LONG TERM GOAL #2   Title Patient will improve FGA to >/= 24/30 to demonstrate improved balance and reduced fall risk    Baseline 20/30    Time 4    Period Weeks    Status New      PT LONG TERM GOAL #3   Title Patient will be able to hold situation 3 of M-CSTIB for full 30 seconds, and situation 4 for >/= 15 seconds to demonstrate improved balance    Baseline situation 3: 26 seconds, situation 4: </= 5 seconds    Time 4    Period Weeks    Status New      PT LONG TERM GOAL #4   Title --    Baseline --    Status --  PT LONG TERM GOAL #5   Title --     Baseline --    Status --                 Plan - 10/20/19 1534    Clinical Impression Statement Upgraded standing marching and side stepping to use green theraband for resistance for HEP. Remainder of session focused on balance strategies on compliant surfaces and on inclines. Pt continues to have most difficulty with SLS activities on compliant surfaces with RLE. Pt tolerated session well, will continue to progress towards LTGs.    Personal Factors and Comorbidities Comorbidity 3+;Past/Current Experience;Behavior Pattern    Comorbidities BPH, CAD, HTN, CKD III, prediabetes, mild cognitive impairment,    Examination-Activity Limitations Transfers;Stairs;Locomotion Level    Examination-Participation Restrictions Community Activity    Stability/Clinical Decision Making Evolving/Moderate complexity    Rehab Potential Good    PT Frequency 1x / week    PT Duration 4 weeks    PT Treatment/Interventions ADLs/Self Care Home Management;DME Instruction;Stair training;Therapeutic activities;Functional mobility training;Gait training;Therapeutic exercise;Balance training;Neuromuscular re-education;Patient/family education;Vestibular    PT Next Visit Plan SLS activities on compliant surfaces, eyes closed balance, compliant surfaces for incline, high level balance    PT Home Exercise Plan Access Code: [email or web address]    Consulted and Agree with Plan of Care Patient;Family member/caregiver    Family Member Consulted pt's spouse           Patient will benefit from skilled therapeutic intervention in order to improve the following deficits and impairments:  Abnormal gait, Decreased balance, Decreased activity tolerance, Decreased coordination, Decreased knowledge of use of DME, Decreased safety awareness, Decreased strength, Difficulty walking, Dizziness, Impaired UE functional use  Visit Diagnosis: Muscle weakness (generalized)  Other symptoms and signs involving the nervous system  Unsteadiness on  feet  Difficulty in walking, not elsewhere classified  Other abnormalities of gait and mobility     Problem List Patient Active Problem List   Diagnosis Date Noted  . Fracture of multiple ribs with pain 08/10/2019  . Clavicle fracture 08/10/2019  . Closed displaced fracture of phalanx of left thumb, sequela 08/10/2019  . TBI (traumatic brain injury) ([place]) 07/23/2019  . Traumatic closed fracture of distal clavicle with minimal displacement, left, initial encounter 07/19/2019  . Traumatic brain injury with loss of consciousness ([place])   . Multiple trauma   . Benign prostatic hyperplasia   . Essential hypertension   . AKI (acute kidney injury) ([place])   . Stage 3b chronic kidney disease   . Prediabetes   . Acute blood loss anemia   . Thrombocytopenia ([place])   . Fall 07/14/2019  . Type 2 diabetes mellitus ([place])   . Eosinophilia   . Need for prophylactic vaccination and inoculation against influenza 11/12/2013  . Nocturnal leg cramps 11/23/2012  . Pain in joint, shoulder region 11/23/2012  . Fall 11/04/2012  . Hematoma 11/04/2012  . Acute upper respiratory infections of unspecified site 11/04/2012  . HYPERLIPIDEMIA TYPE IIB / III 03/08/2008  . Orthostatic hypotension 03/08/2008  . CAD, NATIVE VESSEL 03/08/2008    Arliss Journey, PT ,DPT  10/20/2019, 3:44 PM  [facility] [address] [place], [place], [address] Phone: [phone]   Fax:  [phone]  Name: [name] MRN: [id number] Date of Birth: 1933-06-02

## 2019-10-20 NOTE — Patient Instructions (Signed)
Access Code: [email or web address] URL: [email or web address] Date: 10/20/2019 Prepared by: Marlowe Sax  Exercises Supine Shoulder Press with Dowel - 1 x daily - 7 x weekly - 3 sets - 10 reps Supine Shoulder Protraction with Dowel - 1 x daily - 7 x weekly - 3 sets - 10 reps Supine Shoulder Flexion with Dowel - 1 x daily - 7 x weekly - 3 sets - 10 reps Supine Shoulder Abduction AAROM with Dowel - 1 x daily - 7 x weekly - 3 sets - 10 reps

## 2019-10-20 NOTE — Therapy (Signed)
[facility] [address] [place], [place], [address] Phone: [phone]   Fax:  [phone]  Occupational Therapy Treatment  Patient Details  Name: [name] MRN: [id number] Date of Birth: May 10, 1933 Referring Provider (OT): Reesa Chew   Encounter Date: 10/20/2019   OT End of Session - 10/20/19 6073    Visit Number 13    Number of Visits 17    Date for OT Re-Evaluation 10/27/19    Authorization Type [employer] - Follow Medicare guidelines    Authorization - Visit Number 13    Authorization - Number of Visits 20    Progress Note Due on Visit 20    OT Start Time 1315    OT Stop Time 1400    OT Time Calculation (min) 45 min    Activity Tolerance Patient tolerated treatment well    Behavior During Therapy [facility] for tasks assessed/performed;Impulsive           Past Medical History:  Diagnosis Date  . Allergy   . BPH (benign prostatic hyperplasia)   . BPH (benign prostatic hypertrophy)   . CAD (coronary artery disease)    s/p cypher DES to pLAD 6/08; normal LVF;  ETT-Myoview 2009: no ischemia   . Coronary artery disease   . Eosinophilia   . High cholesterol   . HTN (hypertension)   . Hyperlipidemia   . Hypertension   . MI (myocardial infarction) ([place])   . Myocardial infarction ([place])   . Prediabetes   . Trigeminal neuralgia     Past Surgical History:  Procedure Laterality Date  . APPENDECTOMY    . CARDIAC CATHETERIZATION  07/30/2006   CORONARY ANGIOPLASTY WITH STENT PLACEMENT  . CARDIAC CATHETERIZATION    . EXPLORATORY LAPAROTOMY     age 25  . EXPLORATORY LAPAROTOMY    . IR THORACENTESIS ASP PLEURAL SPACE W/IMG GUIDE  10/05/2019    There were no vitals filed for this visit.   Subjective Assessment - 10/20/19 1322    Subjective  I was plkaying with the children yesterday and I taught them how to do my exercises with play doh!    Currently in Pain? No/denies    Pain Score 0-No pain               [facility] OT Assessment - 10/20/19 0001      AROM   Right Shoulder Flexion 125 Degrees    Left Wrist Extension 55 Degrees    Left Wrist Flexion 45 Degrees                            OT Education - 10/20/19 1516    Education Details HEP to address shoulder range of motion - supine dowel exercises    Person(s) Educated Patient;Spouse    Methods Explanation;Demonstration;Tactile cues;Verbal cues;Handout    Comprehension Verbalized understanding;Returned demonstration            OT Short Term Goals - 10/20/19 1322      OT SHORT TERM GOAL #1   Title Patient will complete an HEP designed to improve left thumb range of motion with min cueing due 09/27/19    Time 4    Period Weeks    Status Achieved    Target Date 09/27/19      OT SHORT TERM GOAL #2   Title Patient will complete a home exercise program designed to improve coordination in left hand with set up assistance  Time 4    Period Weeks    Status Achieved      OT SHORT TERM GOAL #3   Title Patient will don/doff a pull over shirt with set up assistance    Period Weeks    Status Achieved      OT SHORT TERM GOAL #4   Title Patient will demonstrate low reach to obtain a lightweight (less than 3 lb) object with left hand    Time 4    Period Weeks    Status Achieved             OT Long Term Goals - 10/20/19 1323      OT LONG TERM GOAL #2   Title Patient will bathe and dress himself with modified independence - with increased time    Time 8    Period Weeks    Status Achieved      OT LONG TERM GOAL #3   Title Patient will prepare a familiar hot meal using left hand as non dominant with supervision    Time 8    Period Weeks    Status Achieved      OT LONG TERM GOAL #4   Title Patient will demonstrate ability to manipulate small items within left hand as evidenced by his ability to complete 9 hole peg test in less than 1 minute    Time 8    Period Weeks    Status Achieved      OT  LONG TERM GOAL #5   Title Patient will understand recommendations related to driving    Time 8    Period Weeks    Status Achieved                 Plan - 10/20/19 1517    Clinical Impression Statement Patient progressing toward goal achievement    OT Occupational Profile and History Detailed Assessment- Review of Records and additional review of physical, cognitive, psychosocial history related to current functional performance    Occupational performance deficits (Please refer to evaluation for details): ADL's;IADL's;Leisure;Rest and Sleep    Body Structure / Function / Physical Skills ADL;Coordination;Endurance;GMC;UE functional use;Sensation;Decreased knowledge of precautions;Balance;Body mechanics;Decreased knowledge of use of DME;Flexibility;IADL;Pain;Strength;FMC;Dexterity;Cardiopulmonary status limiting activity;ROM;Mobility;Tone    Cognitive Skills Attention;Thought;Problem Solve;Safety Awareness;Memory;Energy/Drive    Rehab Potential Excellent    Clinical Decision Making Several treatment options, min-mod task modification necessary    Comorbidities Affecting Occupational Performance: May have comorbidities impacting occupational performance    Modification or Assistance to Complete Evaluation  Min-Moderate modification of tasks or assist with assess necessary to complete eval    OT Frequency 2x / week    OT Duration 8 weeks    OT Treatment/Interventions Self-care/ADL training;Electrical Stimulation;Iontophoresis;Therapeutic exercise;Moist Heat;Paraffin;Neuromuscular education;Splinting;Patient/family education;Balance training;Therapeutic activities;Functional Mobility Training;Fluidtherapy;Cryotherapy;Ultrasound;DME and/or AE instruction;Manual Therapy;Passive range of motion;Cognitive remediation/compensation    Plan Left shoulder range of motion, Left hand function, selective attention    OT Home Exercise Plan Has HEP from CIR for shoulder and thumb (L)    Consulted and  Agree with Plan of Care Patient;Family member/caregiver    Family Member Consulted [place]           Patient will benefit from skilled therapeutic intervention in order to improve the following deficits and impairments:   Body Structure / Function / Physical Skills: ADL, Coordination, Endurance, GMC, UE functional use, Sensation, Decreased knowledge of precautions, Balance, Body mechanics, Decreased knowledge of use of DME, Flexibility, IADL, Pain, Strength, FMC, Dexterity, Cardiopulmonary status limiting activity,  ROM, Mobility, Tone Cognitive Skills: Attention, Thought, Problem Solve, Safety Awareness, Memory, Energy/Drive     Visit Diagnosis: Pain in left hand  Stiffness of left hand, not elsewhere classified  Attention and concentration deficit  Muscle weakness (generalized)  Other symptoms and signs involving the nervous system  Unsteadiness on feet  Stiffness of left wrist, not elsewhere classified    Problem List Patient Active Problem List   Diagnosis Date Noted  . Fracture of multiple ribs with pain 08/10/2019  . Clavicle fracture 08/10/2019  . Closed displaced fracture of phalanx of left thumb, sequela 08/10/2019  . TBI (traumatic brain injury) ([place]) 07/23/2019  . Traumatic closed fracture of distal clavicle with minimal displacement, left, initial encounter 07/19/2019  . Traumatic brain injury with loss of consciousness ([place])   . Multiple trauma   . Benign prostatic hyperplasia   . Essential hypertension   . AKI (acute kidney injury) ([place])   . Stage 3b chronic kidney disease   . Prediabetes   . Acute blood loss anemia   . Thrombocytopenia ([place])   . Fall 07/14/2019  . Type 2 diabetes mellitus ([place])   . Eosinophilia   . Need for prophylactic vaccination and inoculation against influenza 11/12/2013  . Nocturnal leg cramps 11/23/2012  . Pain in joint, shoulder region 11/23/2012  . Fall 11/04/2012  . Hematoma 11/04/2012  . Acute upper respiratory  infections of unspecified site 11/04/2012  . HYPERLIPIDEMIA TYPE IIB / III 03/08/2008  . Orthostatic hypotension 03/08/2008  . CAD, NATIVE VESSEL 03/08/2008    Mariah Milling, OTR/L 10/20/2019, 3:19 PM  [facility] [address] [place], [place], [address] Phone: [phone]   Fax:  [phone]  Name: [name] MRN: [id number] Date of Birth: [birth year]

## 2019-10-20 NOTE — Patient Instructions (Signed)
Access Code: [email or web address] URL: [email or web address] Date: 10/20/2019 Prepared by: Janann August  Exercises Walking March - 1 x daily - 5 x weekly - 1 sets - 3 reps Tandem Walking with Counter Support - 1 x daily - 5 x weekly - 1 sets - 3 reps Heel Toe Raises with Counter Support - 1 x daily - 5 x weekly - 1 sets - 10 reps Step Up - 1 x daily - 5 x weekly - 1 sets - 10 reps Backward Walking with Counter Support - 1 x daily - 5 x weekly - 3 sets - 10 reps Side Stepping with Resistance at Thighs and Counter Support - 1 x daily - 5 x weekly - 1 sets - 3 reps

## 2019-10-20 NOTE — Therapy (Signed)
[facility] [address] [place], [place], [address] Phone: [phone]   Fax:  [phone]  Speech Language Pathology Treatment  Patient Details  Name: [name] MRN: [id number] Date of Birth: October 13, 1933 Referring Provider (SLP): Reesa Chew, PA-C   Encounter Date: 10/20/2019   End of Session - 10/20/19 1714    Visit Number 13    Number of Visits 17    Date for SLP Re-Evaluation 11/16/19    SLP Start Time 5397    SLP Stop Time  1445    SLP Time Calculation (min) 42 min    Activity Tolerance Patient tolerated treatment well           Past Medical History:  Diagnosis Date  . Allergy   . BPH (benign prostatic hyperplasia)   . BPH (benign prostatic hypertrophy)   . CAD (coronary artery disease)    s/p cypher DES to pLAD 6/08; normal LVF;  ETT-Myoview 2009: no ischemia   . Coronary artery disease   . Eosinophilia   . High cholesterol   . HTN (hypertension)   . Hyperlipidemia   . Hypertension   . MI (myocardial infarction) ([place])   . Myocardial infarction ([place])   . Prediabetes   . Trigeminal neuralgia     Past Surgical History:  Procedure Laterality Date  . APPENDECTOMY    . CARDIAC CATHETERIZATION  07/30/2006   CORONARY ANGIOPLASTY WITH STENT PLACEMENT  . CARDIAC CATHETERIZATION    . EXPLORATORY LAPAROTOMY     age 86  . EXPLORATORY LAPAROTOMY    . IR THORACENTESIS ASP PLEURAL SPACE W/IMG GUIDE  10/05/2019    There were no vitals filed for this visit.   Subjective Assessment - 10/20/19 1705    Subjective Needed cues to navigate to [place] room from PT room.    Patient is accompained by: Family member   wife   Currently in Pain? No/denies                 ADULT SLP TREATMENT - 10/20/19 1706      General Information   Behavior/Cognition Alert;Cooperative;Pleasant mood;Impulsive;Requires cueing      Treatment Provided   Treatment provided Cognitive-Linquistic      Pain Assessment    Pain Assessment No/denies pain      Cognitive-Linquistic Treatment   Treatment focused on Cognition;Patient/family/caregiver education    Skilled Treatment Pt required cues to navigate to ST room from PT room. Wife reports pt using calendar with less cuing; wrote schedule for today and crossed out previous day without cues. Pt correctly located information re: upcoming appointments with extra time. Homework completed, accuracy was 73%. Pt ID'd 1 error independently. Required cues to reduce impulsivity, attention to detail, plan what information to search for prior to initiating search for information. SLP had to prompt pt to take notes to assist with recall/working memory for multi-step problems, and to use compensation (checks) to help track his place when reviewing problems (at times re-read problems he'd already checked and reattempted without awareness). Placed completed/upcoming papers in correct places in binder with question cue.      Assessment / Recommendations / Plan   Plan Continue with current plan of care      Progression Toward Goals   Progression toward goals Progressing toward goals              SLP Short Term Goals - 10/20/19 1437      SLP SHORT TERM GOAL #1   Title  pt will demo knowledge of his memory copmensation system by attempt to use with rare min cues in 2 sessions    Baseline 09-16-19    Status Partially Met      SLP SHORT TERM GOAL #2   Title pt will demo correct/successful orientation to a memory compensation system in 3 sessions    Status Not Met      SLP SHORT TERM GOAL #3   Title pt will tell SLP 2 cognitive deficits independently, and how they may impact him at home with rare min A in 3 sessions    Status Not Met      SLP SHORT TERM GOAL #4   Title pt will undergo further cognitive linguisitic testing PRN    Period --   or 7 total sessions   Status Achieved      SLP SHORT TERM GOAL #5   Title Patient will use compensations for attention during  functional task (eg conversation or reading on topic of interest) with min cues x 2 sessions.    Status Not Met            SLP Long Term Goals - 10/20/19 1437      SLP LONG TERM GOAL #1   Title pt will demo correct usage of a memory compensation system for appointment and/or medication management, to-do lists, daily schedules in 3 sessions    Time 2    Period Weeks   or 17 total sessions, for all LTGs   Status On-going      SLP LONG TERM GOAL #2   Title pt will demo emergent awareness by ID'ing 75% of errors in cognitive linguistic tasks with double-checking x 2 sessions.    Time 2    Period Weeks    Status Revised      SLP LONG TERM GOAL #3   Title Pt will maintain selective attention in functional task for 8 minutes in mod noisy environment with rare min cues x 2 sessions.    Time 2    Period Weeks    Status On-going            Plan - 10/20/19 1715    Clinical Impression Statement Pt presents today with moderate-severe cognitive communication deficits; most significantly in attention and memory. Impulsivity, decreased awareness noted. Pt did not meet most of his STGs; downgraded goal for awareness. Other LTGs to be modified PRN. Pt cont to require usual mod-max cues for where to look in memory book. Continue skilled ST to increase pt independence with schedule and daily tasks.    Speech Therapy Frequency 2x / week    Duration --   8 weeks or 17 total sessions   Treatment/Interventions Language facilitation;Cueing hierarchy;Cognitive reorganization;Internal/external aids;Patient/family education;Compensatory strategies;SLP instruction and feedback;Functional tasks;Environmental controls    Potential to Achieve Goals Good    Potential Considerations Severity of impairments;Ability to learn/carryover information    Consulted and Agree with Plan of Care Patient           Patient will benefit from skilled therapeutic intervention in order to improve the following deficits and  impairments:   Cognitive communication deficit    Problem List Patient Active Problem List   Diagnosis Date Noted  . Fracture of multiple ribs with pain 08/10/2019  . Clavicle fracture 08/10/2019  . Closed displaced fracture of phalanx of left thumb, sequela 08/10/2019  . TBI (traumatic brain injury) ([place]) 07/23/2019  . Traumatic closed fracture of distal clavicle with minimal displacement, left, initial  encounter 07/19/2019  . Traumatic brain injury with loss of consciousness ([place])   . Multiple trauma   . Benign prostatic hyperplasia   . Essential hypertension   . AKI (acute kidney injury) ([place])   . Stage 3b chronic kidney disease   . Prediabetes   . Acute blood loss anemia   . Thrombocytopenia ([place])   . Fall 07/14/2019  . Type 2 diabetes mellitus ([place])   . Eosinophilia   . Need for prophylactic vaccination and inoculation against influenza 11/12/2013  . Nocturnal leg cramps 11/23/2012  . Pain in joint, shoulder region 11/23/2012  . Fall 11/04/2012  . Hematoma 11/04/2012  . Acute upper respiratory infections of unspecified site 11/04/2012  . HYPERLIPIDEMIA TYPE IIB / III 03/08/2008  . Orthostatic hypotension 03/08/2008  . CAD, NATIVE VESSEL 03/08/2008   Deneise Lever, [place], [place] Speech-Language Pathologist  Aliene Altes 10/20/2019, 5:16 PM  [facility] [address] [place], [place], [address] Phone: [phone]   Fax:  [phone]   Name: ASPEN DETERDING MRN: [id number] Date of Birth: Dec 05, 1933

## 2019-10-21 ENCOUNTER — Ambulatory Visit
Admission: RE | Admit: 2019-10-21 | Discharge: 2019-10-21 | Disposition: A | Discharge status: Home / Self Care | Payer: PPO | Source: Ambulatory Visit | Attending: Physical Medicine and Rehabilitation | Admitting: Physical Medicine and Rehabilitation

## 2019-10-21 DIAGNOSIS — R519 Headache, unspecified: Secondary | ICD-10-CM

## 2019-10-21 DIAGNOSIS — I739 Peripheral vascular disease, unspecified: Secondary | ICD-10-CM | POA: Diagnosis not present

## 2019-10-21 DIAGNOSIS — I708 Atherosclerosis of other arteries: Secondary | ICD-10-CM | POA: Diagnosis not present

## 2019-10-21 DIAGNOSIS — I6200 Nontraumatic subdural hemorrhage, unspecified: Secondary | ICD-10-CM | POA: Diagnosis not present

## 2019-10-21 DIAGNOSIS — J3489 Other specified disorders of nose and nasal sinuses: Secondary | ICD-10-CM | POA: Diagnosis not present

## 2019-10-21 IMAGING — CT CT HEAD W/O CM
1 series · 15 of 30 positions shown, 19 images · non-contrast
Comparison: [DATE]

CLINICAL DATA: Headache.  History of previous subdural hematoma

EXAM:
CT HEAD WITHOUT CONTRAST
TECHNIQUE: Contiguous axial images were obtained from the base of the skull
through the vertex without intravenous contrast.

[Series 2: head w/(date) · axial · 0.46mm/px · z∈[-188,-18]mm · 15 of 38 slices shown, 19 images]
[im 2/38  brain]
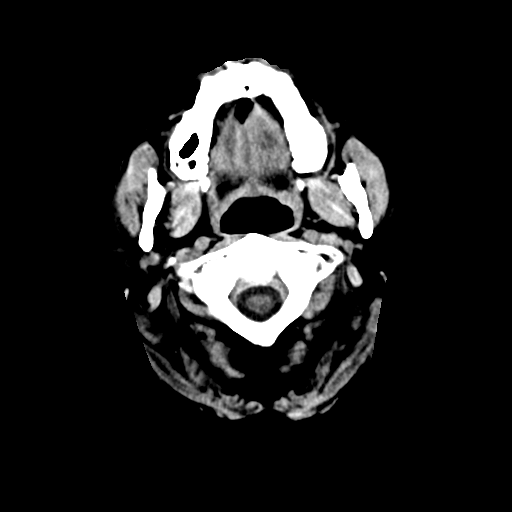
[im 2/38  bone]
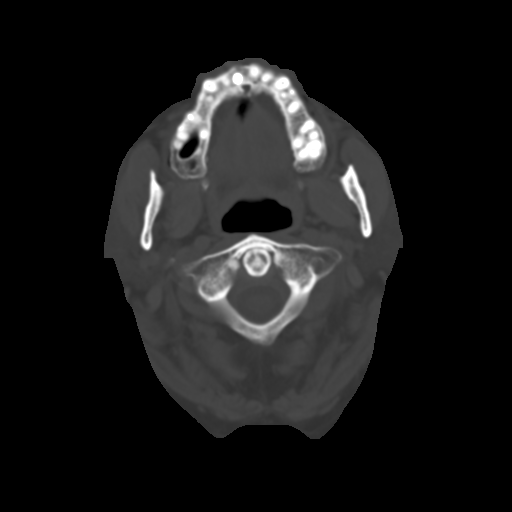
[im 4/38  brain]
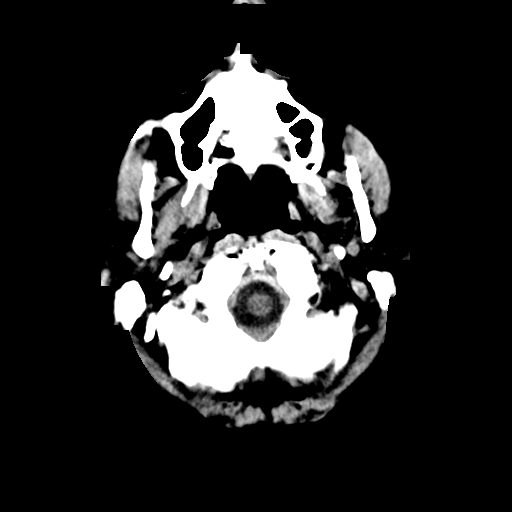
[im 7/38  brain]
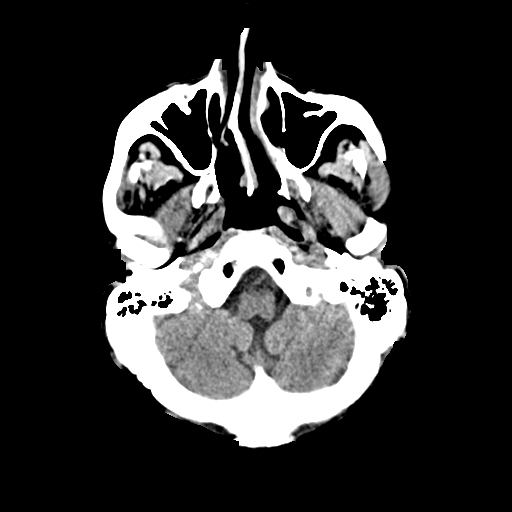
[im 9/38  brain]
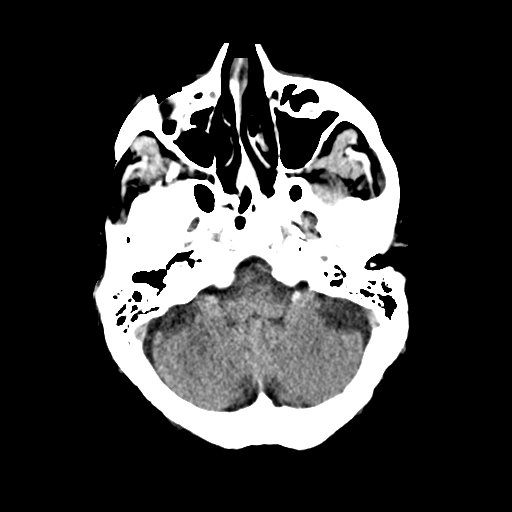
[im 12/38  brain]
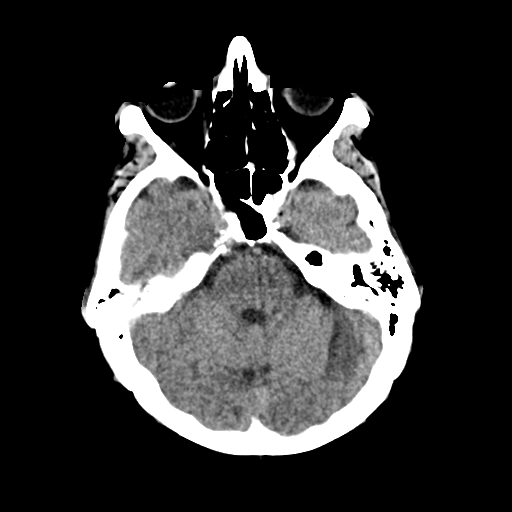
[im 12/38  bone]
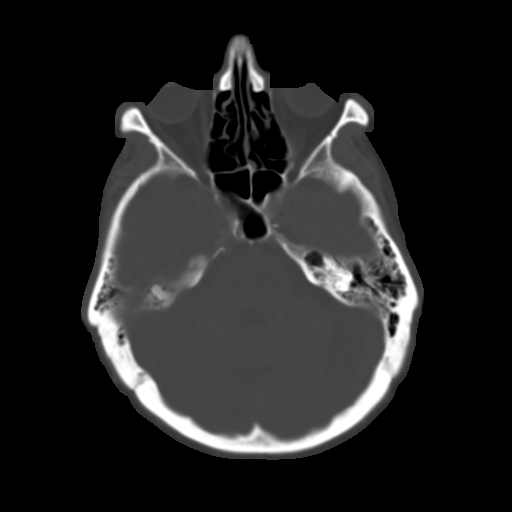
[im 15/38  brain]
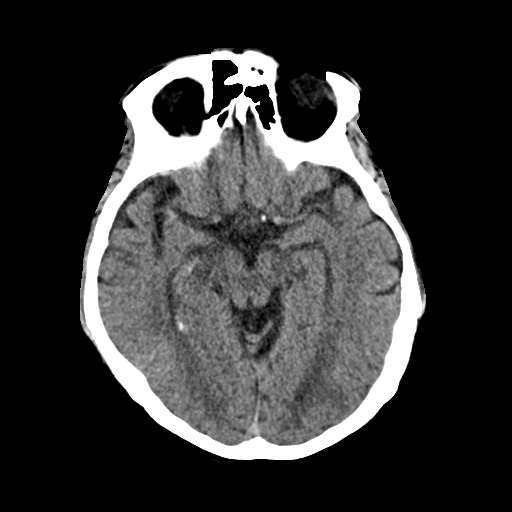
[im 17/38  brain]
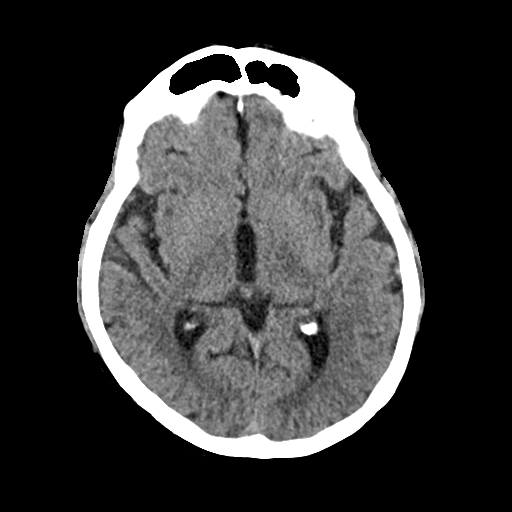
[im 20/38  brain]
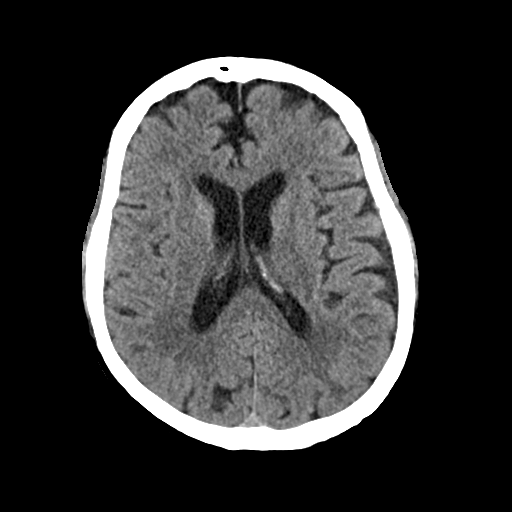
[im 21/38  brain]
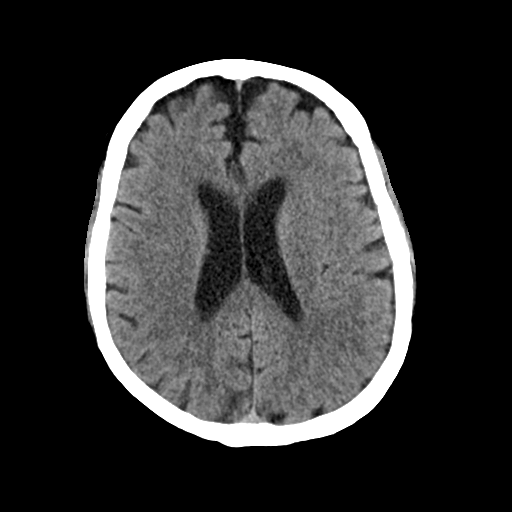
[im 21/38  bone]
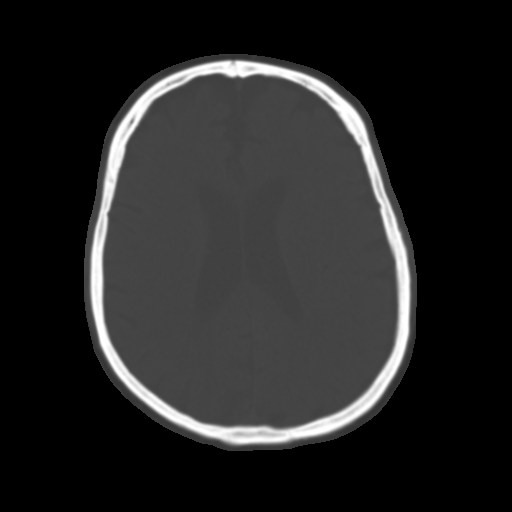
[im 23/38  brain]
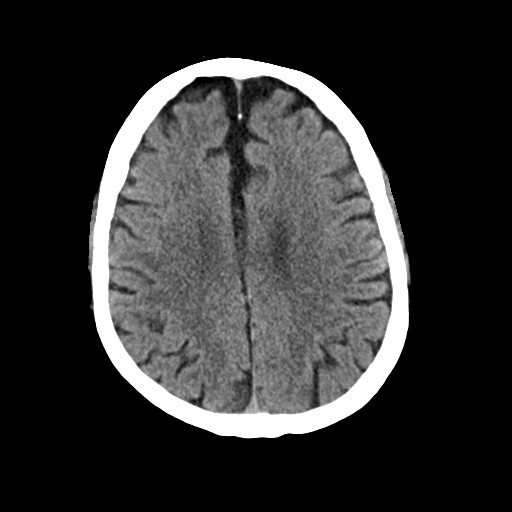
[im 26/38  brain]
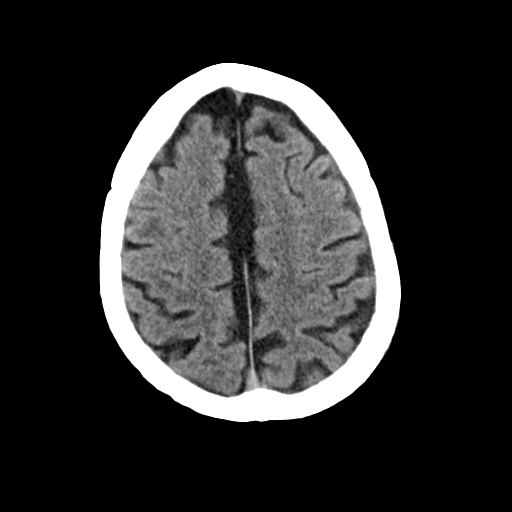
[im 29/38  brain]
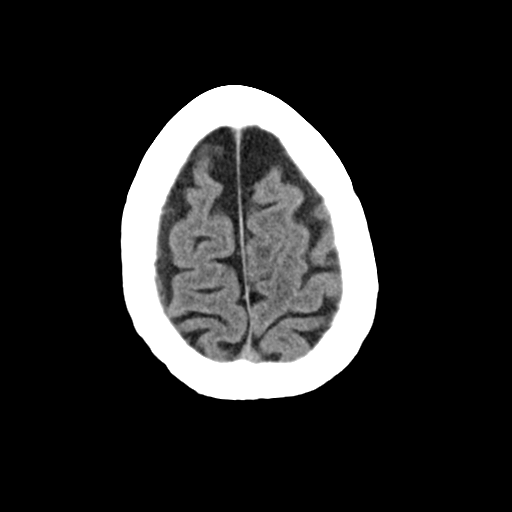
[im 31/38  brain]
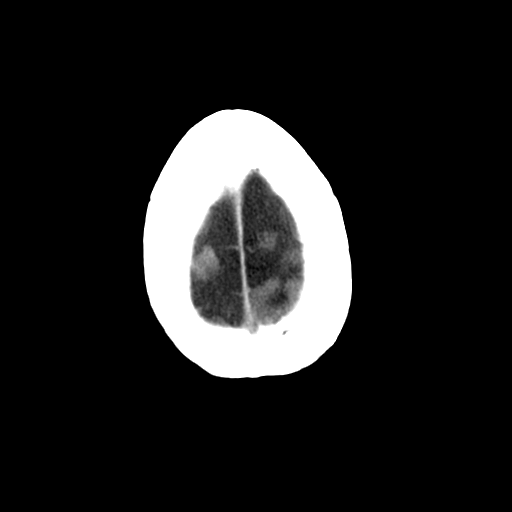
[im 31/38  bone]
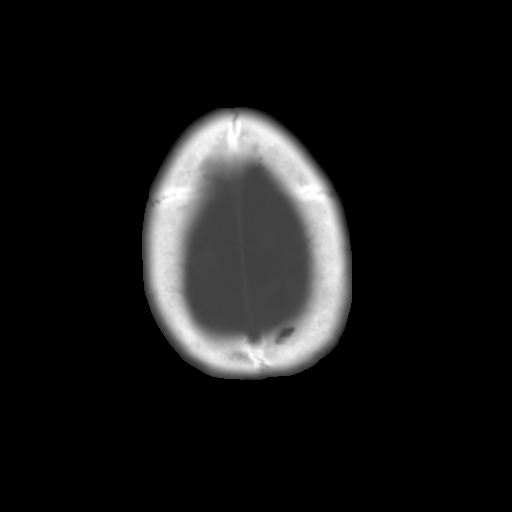
[im 34/38  brain]
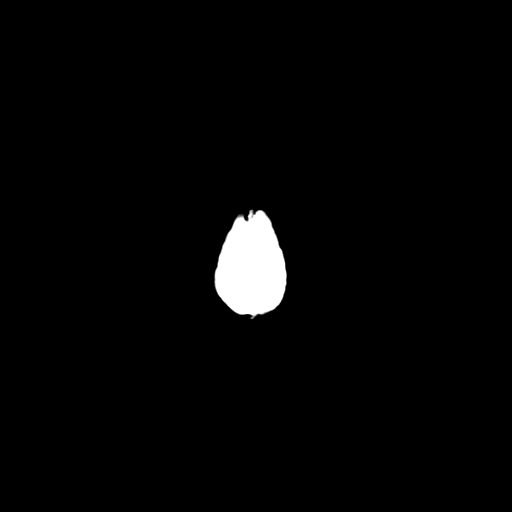
[im 36/38  brain]
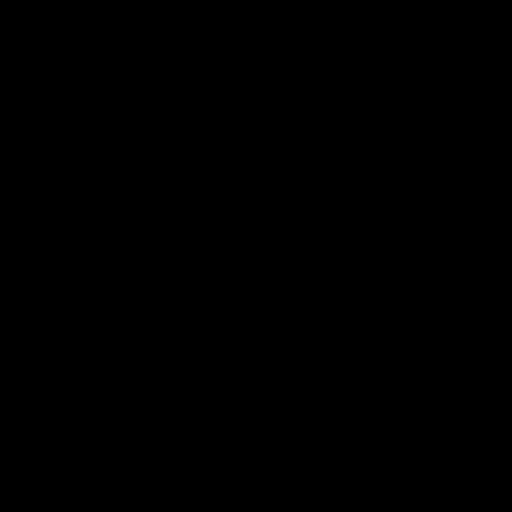

[15 of 30 positions shown; findings below may reference images not displayed]

FINDINGS: Brain: There is age related volume loss. Previous left-sided
subdural hematoma has resolved. Currently there is no demonstrable
mass, hemorrhage, extra-axial fluid collection, or midline shift.
There is mild small vessel disease in the centra semiovale
bilaterally. No acute appearing infarct evident.

Vascular: No hyperdense vessels. There is calcification in each
carotid siphon region.

Skull: Bony calvarium appears intact.

Sinuses/Orbits: There is mucosal thickening in several ethmoid air
cells. Other paranasal sinuses are clear. Orbits appear symmetric
bilaterally.

Other: Mastoid air cells are clear. There is debris in each external
auditory canal.
IMPRESSION: Interval resolution of left-sided subdural hematoma. Currently no
hemorrhage or extra-axial fluid collection.

There is age related volume loss with mild periventricular small
vessel disease. No acute infarct evident.

There are foci of arterial vascular calcification. There is mucosal
thickening in several ethmoid air cells. There is probable cerumen
in each external auditory canal.

## 2019-10-22 ENCOUNTER — Ambulatory Visit: Payer: PPO

## 2019-10-22 ENCOUNTER — Telehealth: Payer: Self-pay

## 2019-10-22 ENCOUNTER — Ambulatory Visit: Payer: PPO | Admitting: Speech Pathology

## 2019-10-22 ENCOUNTER — Ambulatory Visit: Payer: PPO | Admitting: Occupational Therapy

## 2019-10-22 ENCOUNTER — Other Ambulatory Visit: Payer: Self-pay

## 2019-10-22 ENCOUNTER — Encounter: Payer: Self-pay | Admitting: Occupational Therapy

## 2019-10-22 DIAGNOSIS — M25632 Stiffness of left wrist, not elsewhere classified: Secondary | ICD-10-CM

## 2019-10-22 DIAGNOSIS — R2681 Unsteadiness on feet: Secondary | ICD-10-CM

## 2019-10-22 DIAGNOSIS — M6281 Muscle weakness (generalized): Secondary | ICD-10-CM

## 2019-10-22 DIAGNOSIS — R41841 Cognitive communication deficit: Secondary | ICD-10-CM

## 2019-10-22 DIAGNOSIS — R41840 Attention and concentration deficit: Secondary | ICD-10-CM

## 2019-10-22 DIAGNOSIS — M25642 Stiffness of left hand, not elsewhere classified: Secondary | ICD-10-CM

## 2019-10-22 DIAGNOSIS — R29818 Other symptoms and signs involving the nervous system: Secondary | ICD-10-CM

## 2019-10-22 DIAGNOSIS — M79642 Pain in left hand: Secondary | ICD-10-CM

## 2019-10-22 NOTE — Telephone Encounter (Signed)
-----   Message from Sherren Mocha, MD sent at 10/19/2019  1:29 PM EDT ----- Small pericardial effusion. Low-normal cardiac function with LVEF 50-55%. Regional wall motion abnormalities as described. Right heart appears normal. Would continue current Rx.

## 2019-10-22 NOTE — Telephone Encounter (Signed)
Per Dr. [name], informed the patient and his wife labs were benign.  Instructed him to stay off Lasix since he has not been taking it. He understands to call if symptoms return. He was grateful for assistance.  Med list updated.

## 2019-10-22 NOTE — Therapy (Signed)
[facility] [address] [place], [place], [address] Phone: [phone]   Fax:  [phone]  Occupational Therapy Treatment  Patient Details  Name: [name] MRN: [id number] Date of Birth: November 08, 1933 Referring Provider (OT): Reesa Chew   Encounter Date: 10/22/2019   OT End of Session - 10/22/19 1557    Visit Number 14    Number of Visits 17    Date for OT Re-Evaluation 10/27/19    Authorization Type [employer] - Follow Medicare guidelines    Authorization - Visit Number 14    Authorization - Number of Visits 20    Progress Note Due on Visit 20    OT Start Time 9833    OT Stop Time 1530    OT Time Calculation (min) 45 min    Activity Tolerance Patient tolerated treatment well    Behavior During Therapy [facility] for tasks assessed/performed;Impulsive           Past Medical History:  Diagnosis Date  . Allergy   . BPH (benign prostatic hyperplasia)   . BPH (benign prostatic hypertrophy)   . CAD (coronary artery disease)    s/p cypher DES to pLAD 6/08; normal LVF;  ETT-Myoview 2009: no ischemia   . Coronary artery disease   . Eosinophilia   . High cholesterol   . HTN (hypertension)   . Hyperlipidemia   . Hypertension   . MI (myocardial infarction) ([place])   . Myocardial infarction ([place])   . Prediabetes   . Trigeminal neuralgia     Past Surgical History:  Procedure Laterality Date  . APPENDECTOMY    . CARDIAC CATHETERIZATION  07/30/2006   CORONARY ANGIOPLASTY WITH STENT PLACEMENT  . CARDIAC CATHETERIZATION    . EXPLORATORY LAPAROTOMY     age 86  . EXPLORATORY LAPAROTOMY    . IR THORACENTESIS ASP PLEURAL SPACE W/IMG GUIDE  10/05/2019    There were no vitals filed for this visit.   Subjective Assessment - 10/22/19 1456    Subjective  I over did it and my shoulders are hurting    Patient is accompanied by: Family member    Currently in Pain? Yes    Pain Score 8     Pain Location Shoulder     Pain Orientation Left    Pain Descriptors / Indicators Aching;Sore    Pain Type Acute pain    Pain Onset In the past 7 days    Pain Frequency Intermittent    Aggravating Factors  accessive range    Pain Relieving Factors rest    Multiple Pain Sites Yes    Pain Score 6    Pain Location Shoulder    Pain Orientation Right    Pain Descriptors / Indicators Aching    Pain Type Acute pain    Pain Frequency Intermittent                        OT Treatments/Exercises (OP) - 10/22/19 0001      Cognitive Exercises   Other Cognitive Exercises 1 Patient handed this therapist his homework from SLP.  He was unable to recall that two days ago, the recipe exercise was completed with speech therapist.        Neurological Re-education Exercises   Other Exercises 1 Updated HEP to address only shoulder press at this time to avoid overwork and pain.        Moist Heat Therapy   Number Minutes Moist Heat  10 Minutes    Moist Heat Location Shoulder   R/L     Manual Therapy   Manual Therapy Passive ROM    Joint Mobilization Clavicle to aide with backward roill, and elevation with GH motion.      Passive ROM Very gentle rrange of motion to left glenohumeral joint to address IR/ER, ABD, FLEX - patient supine.  Overt cueing and increased time for passive motion.  Patient with significant muscle guarding.                    OT Education - 10/22/19 1556    Education Details Updated HEP to downgrade to only shoulder presses.  Patient's daughter here to aide with carryover to home.    Person(s) Educated Patient;Child(ren)    Methods Explanation;Demonstration;Tactile cues;Verbal cues;Handout    Comprehension Verbalized understanding;Returned demonstration;Need further instruction            OT Short Term Goals - 10/20/19 1322      OT SHORT TERM GOAL #1   Title Patient will complete an HEP designed to improve left thumb range of motion with min cueing due 09/27/19    Time 4     Period Weeks    Status Achieved    Target Date 09/27/19      OT SHORT TERM GOAL #2   Title Patient will complete a home exercise program designed to improve coordination in left hand with set up assistance    Time 4    Period Weeks    Status Achieved      OT SHORT TERM GOAL #3   Title Patient will don/doff a pull over shirt with set up assistance    Period Weeks    Status Achieved      OT SHORT TERM GOAL #4   Title Patient will demonstrate low reach to obtain a lightweight (less than 3 lb) object with left hand    Time 4    Period Weeks    Status Achieved             OT Long Term Goals - 10/20/19 1323      OT LONG TERM GOAL #2   Title Patient will bathe and dress himself with modified independence - with increased time    Time 8    Period Weeks    Status Achieved      OT LONG TERM GOAL #3   Title Patient will prepare a familiar hot meal using left hand as non dominant with supervision    Time 8    Period Weeks    Status Achieved      OT LONG TERM GOAL #4   Title Patient will demonstrate ability to manipulate small items within left hand as evidenced by his ability to complete 9 hole peg test in less than 1 minute    Time 8    Period Weeks    Status Achieved      OT LONG TERM GOAL #5   Title Patient will understand recommendations related to driving    Time 8    Period Weeks    Status Achieved                 Plan - 10/22/19 1557    Clinical Impression Statement Patient with pain in bilateral shoulders after attempting to do exercises at home, stating "I over did it"  Patient's HEP downgraded to reduce potential for causing shoulder pain.    OT Occupational Profile and History  Detailed Assessment- Review of Records and additional review of physical, cognitive, psychosocial history related to current functional performance    Occupational performance deficits (Please refer to evaluation for details): ADL's;IADL's;Leisure;Rest and Sleep    Body Structure  / Function / Physical Skills ADL;Coordination;Endurance;GMC;UE functional use;Sensation;Decreased knowledge of precautions;Balance;Body mechanics;Decreased knowledge of use of DME;Flexibility;IADL;Pain;Strength;FMC;Dexterity;Cardiopulmonary status limiting activity;ROM;Mobility;Tone    Cognitive Skills Attention;Thought;Problem Solve;Safety Awareness;Memory;Energy/Drive    Rehab Potential Excellent    Clinical Decision Making Several treatment options, min-mod task modification necessary    Comorbidities Affecting Occupational Performance: May have comorbidities impacting occupational performance    Modification or Assistance to Complete Evaluation  Min-Moderate modification of tasks or assist with assess necessary to complete eval    OT Frequency 2x / week    OT Duration 8 weeks    OT Treatment/Interventions Self-care/ADL training;Electrical Stimulation;Iontophoresis;Therapeutic exercise;Moist Heat;Paraffin;Neuromuscular education;Splinting;Patient/family education;Balance training;Therapeutic activities;Functional Mobility Training;Fluidtherapy;Cryotherapy;Ultrasound;DME and/or AE instruction;Manual Therapy;Passive range of motion;Cognitive remediation/compensation    Plan Left shoulder range of motion, Left hand function, selective attention    OT Home Exercise Plan Has HEP from CIR for shoulder and thumb (L)    Consulted and Agree with Plan of Care Patient;Family member/caregiver    Family Member Consulted daughter           Patient will benefit from skilled therapeutic intervention in order to improve the following deficits and impairments:   Body Structure / Function / Physical Skills: ADL, Coordination, Endurance, GMC, UE functional use, Sensation, Decreased knowledge of precautions, Balance, Body mechanics, Decreased knowledge of use of DME, Flexibility, IADL, Pain, Strength, FMC, Dexterity, Cardiopulmonary status limiting activity, ROM, Mobility, Tone Cognitive Skills: Attention,  Thought, Problem Solve, Safety Awareness, Memory, Energy/Drive     Visit Diagnosis: Pain in left hand  Stiffness of left hand, not elsewhere classified  Attention and concentration deficit  Muscle weakness (generalized)  Other symptoms and signs involving the nervous system  Unsteadiness on feet  Stiffness of left wrist, not elsewhere classified    Problem List Patient Active Problem List   Diagnosis Date Noted  . Fracture of multiple ribs with pain 08/10/2019  . Clavicle fracture 08/10/2019  . Closed displaced fracture of phalanx of left thumb, sequela 08/10/2019  . TBI (traumatic brain injury) ([place]) 07/23/2019  . Traumatic closed fracture of distal clavicle with minimal displacement, left, initial encounter 07/19/2019  . Traumatic brain injury with loss of consciousness ([place])   . Multiple trauma   . Benign prostatic hyperplasia   . Essential hypertension   . AKI (acute kidney injury) ([place])   . Stage 3b chronic kidney disease   . Prediabetes   . Acute blood loss anemia   . Thrombocytopenia ([place])   . Fall 07/14/2019  . Type 2 diabetes mellitus ([place])   . Eosinophilia   . Need for prophylactic vaccination and inoculation against influenza 11/12/2013  . Nocturnal leg cramps 11/23/2012  . Pain in joint, shoulder region 11/23/2012  . Fall 11/04/2012  . Hematoma 11/04/2012  . Acute upper respiratory infections of unspecified site 11/04/2012  . HYPERLIPIDEMIA TYPE IIB / III 03/08/2008  . Orthostatic hypotension 03/08/2008  . CAD, NATIVE VESSEL 03/08/2008    Mariah Milling, OTR/L 10/22/2019, 3:59 PM  [facility] [address] [place], [place], [address] Phone: [phone]   Fax:  [phone]  Name: VANDERBILT RANIERI MRN: [id number] Date of Birth: [birth year]

## 2019-10-23 NOTE — Therapy (Signed)
[facility] [address] [place], [place], [address] Phone: [phone]   Fax:  [phone]  Speech Language Pathology Treatment  Patient Details  Name: [name] MRN: [id number] Date of Birth: 28-Feb-1933 Referring Provider (SLP): Reesa Chew, PA-C   Encounter Date: 10/22/2019   End of Session - 10/23/19 0758    Visit Number 14    Number of Visits 17    Date for SLP Re-Evaluation 11/16/19    SLP Start Time 1533    SLP Stop Time  1615    SLP Time Calculation (min) 42 min    Activity Tolerance Patient tolerated treatment well           Past Medical History:  Diagnosis Date  . Allergy   . BPH (benign prostatic hyperplasia)   . BPH (benign prostatic hypertrophy)   . CAD (coronary artery disease)    s/p cypher DES to pLAD 6/08; normal LVF;  ETT-Myoview 2009: no ischemia   . Coronary artery disease   . Eosinophilia   . High cholesterol   . HTN (hypertension)   . Hyperlipidemia   . Hypertension   . MI (myocardial infarction) ([place])   . Myocardial infarction ([place])   . Prediabetes   . Trigeminal neuralgia     Past Surgical History:  Procedure Laterality Date  . APPENDECTOMY    . CARDIAC CATHETERIZATION  07/30/2006   CORONARY ANGIOPLASTY WITH STENT PLACEMENT  . CARDIAC CATHETERIZATION    . EXPLORATORY LAPAROTOMY     age 86  . EXPLORATORY LAPAROTOMY    . IR THORACENTESIS ASP PLEURAL SPACE W/IMG GUIDE  10/05/2019    There were no vitals filed for this visit.   Subjective Assessment - 10/23/19 0753    Subjective Arrives with daughter    Patient is accompained by: Family member   daughter   Currently in Pain? No/denies   OT relieved shoulder pain                ADULT SLP TREATMENT - 10/23/19 0754      General Information   Behavior/Cognition Alert;Cooperative;Pleasant mood;Impulsive;Requires cueing      Treatment Provided   Treatment provided Cognitive-Linquistic       Cognitive-Linquistic Treatment   Treatment focused on Cognition;Patient/family/caregiver education    Skilled Treatment 1/2 homework sheets completed; one had been moved from "to do" pocket and thus pt had overlooked this. Completed was 100% correct; pt had taken notes for his problem solving and SLP praised pt for this. Targeted pt's attention, awareness with simple-mod complex money problems; level of cuing for double-checking, use of compensations appears commensurate with previous sessions.      Assessment / Recommendations / Plan   Plan Continue with current plan of care      Progression Toward Goals   Progression toward goals --   slow progress             SLP Short Term Goals - 10/23/19 0759      SLP SHORT TERM GOAL #1   Title pt will demo knowledge of his memory copmensation system by attempt to use with rare min cues in 2 sessions    Baseline 09-16-19    Status Partially Met      SLP SHORT TERM GOAL #2   Title pt will demo correct/successful orientation to a memory compensation system in 3 sessions    Status Not Met      SLP SHORT TERM GOAL #3   Title pt  will tell SLP 2 cognitive deficits independently, and how they may impact him at home with rare min A in 3 sessions    Status Not Met      SLP SHORT TERM GOAL #4   Title pt will undergo further cognitive linguisitic testing PRN    Period --   or 7 total sessions   Status Achieved      SLP SHORT TERM GOAL #5   Title Patient will use compensations for attention during functional task (eg conversation or reading on topic of interest) with min cues x 2 sessions.    Status Not Met            SLP Long Term Goals - 10/23/19 0759      SLP LONG TERM GOAL #1   Title pt will demo correct usage of a memory compensation system for appointment and/or medication management, to-do lists, daily schedules in 3 sessions    Time 2    Period Weeks   or 17 total sessions, for all LTGs   Status On-going      SLP LONG TERM GOAL #2    Title pt will demo emergent awareness by ID'ing 75% of errors in cognitive linguistic tasks with double-checking x 2 sessions.    Time 2    Period Weeks    Status Revised      SLP LONG TERM GOAL #3   Title Pt will maintain selective attention in functional task for 8 minutes in mod noisy environment with rare min cues x 2 sessions.    Time 2    Period Weeks    Status On-going            Plan - 10/23/19 0759    Clinical Impression Statement Pt presents today with moderate-severe cognitive communication deficits; most significantly in attention and memory. Impulsivity, decreased awareness noted. Pt did not meet most of his STGs; downgraded goal for awareness. Other LTGs to be modified PRN. Pt cont to require usual mod-max cues for where to look in memory book. Continue skilled ST to increase pt independence with schedule and daily tasks.    Speech Therapy Frequency 2x / week    Duration --   8 weeks or 17 total sessions   Treatment/Interventions Language facilitation;Cueing hierarchy;Cognitive reorganization;Internal/external aids;Patient/family education;Compensatory strategies;SLP instruction and feedback;Functional tasks;Environmental controls    Potential to Achieve Goals Good    Potential Considerations Severity of impairments;Ability to learn/carryover information    Consulted and Agree with Plan of Care Patient           Patient will benefit from skilled therapeutic intervention in order to improve the following deficits and impairments:   Cognitive communication deficit    Problem List Patient Active Problem List   Diagnosis Date Noted  . Fracture of multiple ribs with pain 08/10/2019  . Clavicle fracture 08/10/2019  . Closed displaced fracture of phalanx of left thumb, sequela 08/10/2019  . TBI (traumatic brain injury) ([place]) 07/23/2019  . Traumatic closed fracture of distal clavicle with minimal displacement, left, initial encounter 07/19/2019  . Traumatic brain  injury with loss of consciousness ([place])   . Multiple trauma   . Benign prostatic hyperplasia   . Essential hypertension   . AKI (acute kidney injury) ([place])   . Stage 3b chronic kidney disease   . Prediabetes   . Acute blood loss anemia   . Thrombocytopenia ([place])   . Fall 07/14/2019  . Type 2 diabetes mellitus ([place])   . Eosinophilia   .  Need for prophylactic vaccination and inoculation against influenza 11/12/2013  . Nocturnal leg cramps 11/23/2012  . Pain in joint, shoulder region 11/23/2012  . Fall 11/04/2012  . Hematoma 11/04/2012  . Acute upper respiratory infections of unspecified site 11/04/2012  . HYPERLIPIDEMIA TYPE IIB / III 03/08/2008  . Orthostatic hypotension 03/08/2008  . CAD, NATIVE VESSEL 03/08/2008   Deneise Lever, [place], [place] Speech-Language Pathologist  Aliene Altes 10/23/2019, 8:00 AM  [facility] [address] [place], [place], [address] Phone: [phone]   Fax:  [phone]   Name: TODD JELINSKI MRN: [id number] Date of Birth: [birth year]

## 2019-10-26 ENCOUNTER — Other Ambulatory Visit: Payer: PPO

## 2019-10-27 ENCOUNTER — Encounter: Payer: PPO | Admitting: Speech Pathology

## 2019-10-29 ENCOUNTER — Ambulatory Visit: Payer: PPO | Admitting: Speech Pathology

## 2019-10-29 ENCOUNTER — Other Ambulatory Visit: Payer: Self-pay

## 2019-10-29 ENCOUNTER — Ambulatory Visit: Payer: PPO

## 2019-10-29 ENCOUNTER — Other Ambulatory Visit: Payer: Self-pay | Admitting: Family Medicine

## 2019-10-29 ENCOUNTER — Encounter: Payer: PPO | Admitting: Speech Pathology

## 2019-10-29 DIAGNOSIS — N1832 Chronic kidney disease, stage 3b: Secondary | ICD-10-CM | POA: Diagnosis not present

## 2019-10-29 DIAGNOSIS — R748 Abnormal levels of other serum enzymes: Secondary | ICD-10-CM | POA: Diagnosis not present

## 2019-10-29 DIAGNOSIS — D649 Anemia, unspecified: Secondary | ICD-10-CM | POA: Diagnosis not present

## 2019-10-29 DIAGNOSIS — I10 Essential (primary) hypertension: Secondary | ICD-10-CM | POA: Diagnosis not present

## 2019-10-29 DIAGNOSIS — I2510 Atherosclerotic heart disease of native coronary artery without angina pectoris: Secondary | ICD-10-CM | POA: Insufficient documentation

## 2019-10-29 DIAGNOSIS — R0600 Dyspnea, unspecified: Secondary | ICD-10-CM | POA: Diagnosis not present

## 2019-10-29 DIAGNOSIS — I129 Hypertensive chronic kidney disease with stage 1 through stage 4 chronic kidney disease, or unspecified chronic kidney disease: Secondary | ICD-10-CM | POA: Insufficient documentation

## 2019-10-29 DIAGNOSIS — R0982 Postnasal drip: Secondary | ICD-10-CM | POA: Diagnosis not present

## 2019-10-29 DIAGNOSIS — J918 Pleural effusion in other conditions classified elsewhere: Secondary | ICD-10-CM | POA: Insufficient documentation

## 2019-10-29 DIAGNOSIS — R0602 Shortness of breath: Secondary | ICD-10-CM | POA: Diagnosis present

## 2019-10-29 DIAGNOSIS — E1122 Type 2 diabetes mellitus with diabetic chronic kidney disease: Secondary | ICD-10-CM | POA: Diagnosis not present

## 2019-10-29 DIAGNOSIS — Z20822 Contact with and (suspected) exposure to covid-19: Secondary | ICD-10-CM | POA: Diagnosis not present

## 2019-10-29 DIAGNOSIS — Z951 Presence of aortocoronary bypass graft: Secondary | ICD-10-CM | POA: Diagnosis not present

## 2019-10-29 DIAGNOSIS — Z79899 Other long term (current) drug therapy: Secondary | ICD-10-CM | POA: Insufficient documentation

## 2019-10-29 DIAGNOSIS — J029 Acute pharyngitis, unspecified: Secondary | ICD-10-CM | POA: Diagnosis not present

## 2019-10-29 DIAGNOSIS — R030 Elevated blood-pressure reading, without diagnosis of hypertension: Secondary | ICD-10-CM | POA: Diagnosis not present

## 2019-10-29 DIAGNOSIS — J90 Pleural effusion, not elsewhere classified: Secondary | ICD-10-CM | POA: Diagnosis not present

## 2019-10-29 DIAGNOSIS — R7401 Elevation of levels of liver transaminase levels: Secondary | ICD-10-CM | POA: Insufficient documentation

## 2019-10-29 DIAGNOSIS — R0981 Nasal congestion: Secondary | ICD-10-CM | POA: Diagnosis not present

## 2019-10-29 DIAGNOSIS — R4182 Altered mental status, unspecified: Secondary | ICD-10-CM | POA: Insufficient documentation

## 2019-10-29 DIAGNOSIS — J111 Influenza due to unidentified influenza virus with other respiratory manifestations: Secondary | ICD-10-CM | POA: Insufficient documentation

## 2019-10-29 DIAGNOSIS — J9811 Atelectasis: Secondary | ICD-10-CM | POA: Diagnosis not present

## 2019-10-30 ENCOUNTER — Emergency Department (HOSPITAL_COMMUNITY)
Admission: EM | Admit: 2019-10-30 | Discharge: 2019-10-30 | Disposition: A | Discharge status: Home / Self Care | Payer: PPO | Attending: Emergency Medicine | Admitting: Emergency Medicine

## 2019-10-30 ENCOUNTER — Emergency Department (HOSPITAL_COMMUNITY): Payer: PPO

## 2019-10-30 ENCOUNTER — Other Ambulatory Visit: Payer: Self-pay

## 2019-10-30 ENCOUNTER — Encounter (HOSPITAL_COMMUNITY): Payer: Self-pay | Admitting: Emergency Medicine

## 2019-10-30 DIAGNOSIS — J90 Pleural effusion, not elsewhere classified: Secondary | ICD-10-CM | POA: Diagnosis not present

## 2019-10-30 DIAGNOSIS — I10 Essential (primary) hypertension: Secondary | ICD-10-CM

## 2019-10-30 DIAGNOSIS — J111 Influenza due to unidentified influenza virus with other respiratory manifestations: Secondary | ICD-10-CM

## 2019-10-30 DIAGNOSIS — J9811 Atelectasis: Secondary | ICD-10-CM | POA: Diagnosis not present

## 2019-10-30 DIAGNOSIS — R0600 Dyspnea, unspecified: Secondary | ICD-10-CM | POA: Diagnosis not present

## 2019-10-30 DIAGNOSIS — R748 Abnormal levels of other serum enzymes: Secondary | ICD-10-CM

## 2019-10-30 DIAGNOSIS — R7401 Elevation of levels of liver transaminase levels: Secondary | ICD-10-CM

## 2019-10-30 DIAGNOSIS — R509 Fever, unspecified: Secondary | ICD-10-CM

## 2019-10-30 DIAGNOSIS — D649 Anemia, unspecified: Secondary | ICD-10-CM

## 2019-10-30 DIAGNOSIS — N289 Disorder of kidney and ureter, unspecified: Secondary | ICD-10-CM

## 2019-10-30 LAB — CBC WITH DIFFERENTIAL/PLATELET
Abs Immature Granulocytes: 0.02 10*3/uL (ref 0.00–0.07)
Basophils Absolute: 0 10*3/uL (ref 0.0–0.1)
Basophils Relative: 0 %
Eosinophils Absolute: 0.7 10*3/uL — ABNORMAL HIGH (ref 0.0–0.5)
Eosinophils Relative: 9 %
HCT: 38.6 % — ABNORMAL LOW (ref 39.0–52.0)
Hemoglobin: 12.2 g/dL — ABNORMAL LOW (ref 13.0–17.0)
Immature Granulocytes: 0 %
Lymphocytes Relative: 8 %
Lymphs Abs: 0.6 10*3/uL — ABNORMAL LOW (ref 0.7–4.0)
MCH: 29.3 pg (ref 26.0–34.0)
MCHC: 31.6 g/dL (ref 30.0–36.0)
MCV: 92.8 fL (ref 80.0–100.0)
Monocytes Absolute: 0.7 10*3/uL (ref 0.1–1.0)
Monocytes Relative: 10 %
Neutro Abs: 5.3 10*3/uL (ref 1.7–7.7)
Neutrophils Relative %: 73 %
Platelets: 208 10*3/uL (ref 150–400)
RBC: 4.16 MIL/uL — ABNORMAL LOW (ref 4.22–5.81)
RDW: 14.3 % (ref 11.5–15.5)
WBC: 7.2 10*3/uL (ref 4.0–10.5)
nRBC: 0 % (ref 0.0–0.2)

## 2019-10-30 LAB — COMPREHENSIVE METABOLIC PANEL
ALT: 48 U/L — ABNORMAL HIGH (ref 0–44)
AST: 56 U/L — ABNORMAL HIGH (ref 15–41)
Albumin: 3.5 g/dL (ref 3.5–5.0)
Alkaline Phosphatase: 172 U/L — ABNORMAL HIGH (ref 38–126)
Anion gap: 10 (ref 5–15)
BUN: 14 mg/dL (ref 8–23)
CO2: 23 mmol/L (ref 22–32)
Calcium: 8.9 mg/dL (ref 8.9–10.3)
Chloride: 103 mmol/L (ref 98–111)
Creatinine, Ser: 1.36 mg/dL — ABNORMAL HIGH (ref 0.61–1.24)
GFR calc Af Amer: 55 mL/min — ABNORMAL LOW (ref >60)
GFR calc non Af Amer: 47 mL/min — ABNORMAL LOW (ref >60)
Glucose, Bld: 112 mg/dL — ABNORMAL HIGH (ref 70–99)
Potassium: 4.2 mmol/L (ref 3.5–5.1)
Sodium: 136 mmol/L (ref 135–145)
Total Bilirubin: 0.6 mg/dL (ref 0.3–1.2)
Total Protein: 6.4 g/dL — ABNORMAL LOW (ref 6.5–8.1)

## 2019-10-30 LAB — RESPIRATORY PANEL BY RT PCR (FLU A&B, COVID)
Influenza A by PCR: NEGATIVE
Influenza B by PCR: NEGATIVE
SARS Coronavirus 2 by RT PCR: NEGATIVE

## 2019-10-30 LAB — LACTIC ACID, PLASMA: Lactic Acid, Venous: 0.8 mmol/L (ref 0.5–1.9)

## 2019-10-30 IMAGING — DX DG CHEST 1V PORT
1 series · 1 of 1 positions shown · non-contrast
Comparison: [DATE]

CLINICAL DATA: Cough, sore throat, dyspnea

EXAM:
PORTABLE CHEST 1 VIEW

[chest ap]
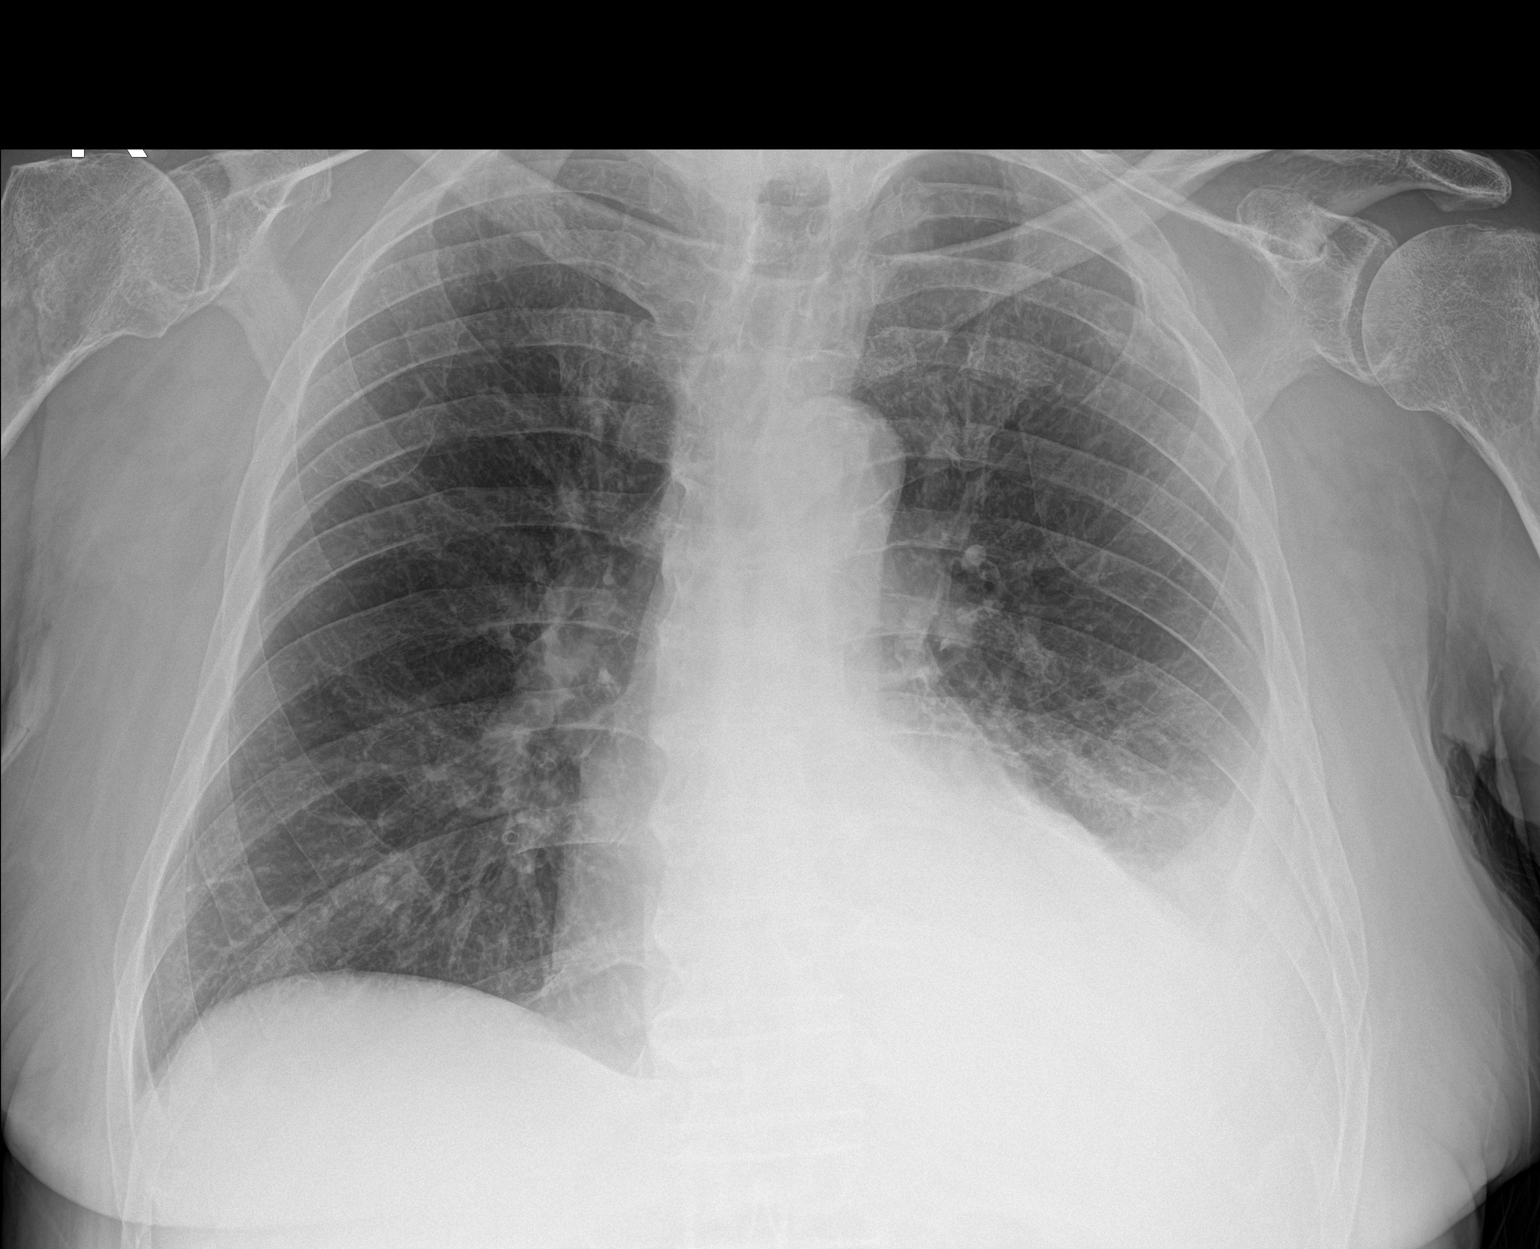

[1 of 1 positions shown; findings below may reference images not displayed]

FINDINGS: Moderate left pleural effusion has developed. Retrocardiac
opacification may relate to posteriorly layering pleural fluid,
atelectasis, or focal infiltrate within this region. Lungs are
otherwise clear. No pneumothorax. No pleural effusion on the right.
Cardiac size within normal limits. Pulmonary vascularity is normal.
IMPRESSION: Enlarging, now moderate left pleural effusion. Retrocardiac
opacification now present representing atelectasis or infiltrate
within this region. In

## 2019-10-30 MED ORDER — ACETAMINOPHEN 325 MG PO TABS
650.0000 mg | ORAL_TABLET | Freq: Once | ORAL | Status: AC
  Administered 2019-10-30: 650 mg via ORAL
  Filled 2019-10-30: qty 2

## 2019-10-30 NOTE — Discharge Instructions (Addendum)
Drink plenty of fluids.  Take acetaminophen or ibuprofen as needed for fever.  Follow up with the pulmonologist to evaluate your pleural effusion.  Return if your breathing is getting worse.

## 2019-10-30 NOTE — ED Triage Notes (Signed)
Pt reports sore throat, cough, SOB, confusion, fever, cough and just "feeling poorly."  Went to urgent care, rapid strep came back negative.

## 2019-10-30 NOTE — ED Provider Notes (Signed)
[facility] EMERGENCY DEPARTMENT Provider Note   CSN: [id number] Arrival date & time: 10/29/19  2312   History Chief Complaint  Patient presents with  . Shortness of Breath  . Sore Throat  . Altered Mental Status    [name] is a 86 y.o. male.  The history is provided by the patient.  Shortness of Breath Sore Throat Associated symptoms include shortness of breath.  Altered Mental Status He has history of hypertension, hyperlipidemia, coronary artery disease and comes in because of cough, fever, dyspnea.  He started having a cough about 3 days ago.  Cough is nonproductive.  He started running fever and having shortness of breath last night.  He denies arthralgias or myalgias.  He denies nausea, vomiting, diarrhea.  He denies any sick contacts.  He has been fully vaccinated against COVID-19.  He had gone to an urgent care center yesterday where rapid strep screen was negative and Covid test had been sent out.  Past Medical History:  Diagnosis Date  . Allergy   . BPH (benign prostatic hyperplasia)   . BPH (benign prostatic hypertrophy)   . CAD (coronary artery disease)    s/p cypher DES to pLAD 6/08; normal LVF;  ETT-Myoview 2009: no ischemia   . Coronary artery disease   . Eosinophilia   . High cholesterol   . HTN (hypertension)   . Hyperlipidemia   . Hypertension   . MI (myocardial infarction) ([place])   . Myocardial infarction ([place])   . Prediabetes   . Trigeminal neuralgia     Patient Active Problem List   Diagnosis Date Noted  . Fracture of multiple ribs with pain 08/10/2019  . Clavicle fracture 08/10/2019  . Closed displaced fracture of phalanx of left thumb, sequela 08/10/2019  . TBI (traumatic brain injury) ([place]) 07/23/2019  . Traumatic closed fracture of distal clavicle with minimal displacement, left, initial encounter 07/19/2019  . Traumatic brain injury with loss of consciousness ([place])   . Multiple trauma   . Benign prostatic  hyperplasia   . Essential hypertension   . AKI (acute kidney injury) ([place])   . Stage 3b chronic kidney disease   . Prediabetes   . Acute blood loss anemia   . Thrombocytopenia ([place])   . Fall 07/14/2019  . Type 2 diabetes mellitus ([place])   . Eosinophilia   . Need for prophylactic vaccination and inoculation against influenza 11/12/2013  . Nocturnal leg cramps 11/23/2012  . Pain in joint, shoulder region 11/23/2012  . Fall 11/04/2012  . Hematoma 11/04/2012  . Acute upper respiratory infections of unspecified site 11/04/2012  . HYPERLIPIDEMIA TYPE IIB / III 03/08/2008  . Orthostatic hypotension 03/08/2008  . CAD, NATIVE VESSEL 03/08/2008    Past Surgical History:  Procedure Laterality Date  . APPENDECTOMY    . CARDIAC CATHETERIZATION  07/30/2006   CORONARY ANGIOPLASTY WITH STENT PLACEMENT  . CARDIAC CATHETERIZATION    . EXPLORATORY LAPAROTOMY     age 38  . EXPLORATORY LAPAROTOMY    . IR THORACENTESIS ASP PLEURAL SPACE W/IMG GUIDE  10/05/2019       Family History  Problem Relation Age of Onset  . Stomach cancer Mother   . Heart disease Father   . Heart disease Brother   . Aortic aneurysm Brother   . Colon cancer Brother   . Coronary artery disease Father 15  . Coronary artery disease Brother   . Aortic aneurysm Brother   . Colon cancer Brother   . Esophageal  cancer Neg Hx   . Rectal cancer Neg Hx   . Arthritis Daughter        rheumatoid    Social History   Tobacco Use  . Smoking status: Never Smoker  . Smokeless tobacco: Never Used  . Tobacco comment: quit over 56yrs ago.  Substance Use Topics  . Alcohol use: Never  . Drug use: Never    Home Medications Prior to Admission medications   Medication Sig Start Date End Date Taking? Authorizing Provider  acetaminophen (TYLENOL) 500 MG tablet Take 1,000 mg by mouth every 6 (six) hours as needed for moderate pain.   Yes [provider]  albuterol (PROAIR HFA) 108 (90 Base) MCG/ACT inhaler INHALE 2 PUFFS  INTO THE LUNGS EVERY 6 HOURS AS NEEDED FOR WHEEZING OR SHORTNESS OF BREATH Patient taking differently: Inhale 2 puffs into the lungs every 6 (six) hours as needed for wheezing or shortness of breath. INHALE 2 PUFFS INTO THE LUNGS EVERY 6 HOURS AS NEEDED FOR WHEEZING OR SHORTNESS OF BREATH 08/06/19  Yes Angiulli, Lavon Paganini, PA-C  Apoaequorin (PREVAGEN) 10 MG CAPS Take 10 mg by mouth daily.   Yes [provider]  atorvastatin (LIPITOR) 40 MG tablet Take 40 mg by mouth daily.   Yes [provider]  Calcium-Vitamin D 600-200 MG-UNIT tablet Take 1 tablet by mouth 2 (two) times daily. Patient taking differently: Take 1 tablet by mouth daily.  08/06/19  Yes Angiulli, Lavon Paganini, PA-C  doxazosin (CARDURA) 2 MG tablet Take 1 tablet (2 mg total) by mouth daily. 08/06/19  Yes Angiulli, Lavon Paganini, PA-C  escitalopram (LEXAPRO) 10 MG tablet TAKE 1 TABLET BY MOUTH EVERY DAY Patient taking differently: Take 10 mg by mouth daily.  09/15/19  Yes [name], MD  ezetimibe (ZETIA) 10 MG tablet Take 1 tablet (10 mg total) by mouth daily. Please make yearly appt with Dr. [name] for October for future refills. 1st attempt 08/03/19  Yes Sherren Mocha, MD  Multiple Vitamin (MULTIVITAMIN WITH MINERALS) TABS tablet Take 1 tablet by mouth daily.   Yes [provider]  fluticasone furoate-vilanterol (BREO ELLIPTA) 200-25 MCG/INH AEPB Inhale 1 puff into the lungs daily. 08/06/19   Angiulli, Lavon Paganini, PA-C    Allergies    Gabapentin  Review of Systems   Review of Systems  Respiratory: Positive for shortness of breath.   All other systems reviewed and are negative.   Physical Exam Updated Vital Signs BP (!) 151/88 (BP Location: Left Arm)   Pulse (!) 102   Temp 98.9 F (37.2 C) (Oral)   Resp 16   SpO2 98%   Physical Exam Vitals and nursing note reviewed.   86 year old male, resting comfortably and in no acute distress. Vital signs are significant for elevated blood pressure. Oxygen saturation  is 98%, which is normal. Head is normocephalic and atraumatic. PERRLA, EOMI. Oropharynx is clear. Neck is nontender and supple without adenopathy or JVD. Back is nontender and there is no CVA tenderness. Lungs are clear without rales, wheezes, or rhonchi. Chest is nontender. Heart has regular rate and rhythm without murmur. Abdomen is soft, flat, nontender without masses or hepatosplenomegaly and peristalsis is normoactive. Extremities have no cyanosis or edema, full range of motion is present. Skin is warm and dry without rash. Neurologic: Mental status is normal, cranial nerves are intact, there are no motor or sensory deficits.   ED Results / Procedures / Treatments   Labs (all labs ordered are listed, but only  abnormal results are displayed) Labs Reviewed  COMPREHENSIVE METABOLIC PANEL - Abnormal; Notable for the following components:      Result Value   Glucose, Bld 112 (*)    Creatinine, Ser 1.36 (*)    Total Protein 6.4 (*)    AST 56 (*)    ALT 48 (*)    Alkaline Phosphatase 172 (*)    GFR calc non Af Amer 47 (*)    GFR calc Af Amer 55 (*)    All other components within normal limits  CBC WITH DIFFERENTIAL/PLATELET - Abnormal; Notable for the following components:   RBC 4.16 (*)    Hemoglobin 12.2 (*)    HCT 38.6 (*)    Lymphs Abs 0.6 (*)    Eosinophils Absolute 0.7 (*)    All other components within normal limits  RESPIRATORY PANEL BY RT PCR (FLU A&B, COVID)  CULTURE, BLOOD (ROUTINE X 2)  CULTURE, BLOOD (ROUTINE X 2)  GROUP A STREP BY PCR  LACTIC ACID, PLASMA  URINALYSIS, ROUTINE W REFLEX MICROSCOPIC  LACTIC ACID, PLASMA   Radiology DG Chest Port 1 View  Result Date: 10/30/2019 CLINICAL DATA:  Cough, sore throat, dyspnea EXAM: PORTABLE CHEST 1 VIEW COMPARISON:  10/05/2019 FINDINGS: Moderate left pleural effusion has developed. Retrocardiac opacification may relate to posteriorly layering pleural fluid, atelectasis, or focal infiltrate within this region. Lungs are  otherwise clear. No pneumothorax. No pleural effusion on the right. Cardiac size within normal limits. Pulmonary vascularity is normal. IMPRESSION: Enlarging, now moderate left pleural effusion. Retrocardiac opacification now present representing atelectasis or infiltrate within this region. In Electronically Signed   By: Fidela Salisbury MD   On: 10/30/2019 01:03    Procedures Procedures   Medications Ordered in ED Medications  acetaminophen (TYLENOL) tablet 650 mg (has no administration in time range)    ED Course  I have reviewed the triage vital signs and the nursing notes.  Pertinent labs & imaging results that were available during my care of the patient were reviewed by me and considered in my medical decision making (see chart for details).  MDM Rules/Calculators/A&P Respiratory tract infection.  Swab was sent to test for influenza and Covid and all were negative.  Chest x-ray showed slight worsening of prior left pleural effusion.  Labs showed mild anemia which is improved compared with baseline, and mild renal insufficiency which is also improved compared with baseline.  Mild elevation of transaminases and alkaline phosphatase is unchanged from baseline.  At this point, no indication for antibiotics.  Old records are reviewed confirming recent thoracentesis with benign findings.  He is concerned about the recurrence of the pleural effusion, is referred to pulmonology for further evaluation.  He is discharged with instructions to use over-the-counter analgesics as needed, return if dyspnea worsens.  Final Clinical Impression(s) / ED Diagnoses Final diagnoses:  Influenza-like illness  Recurrent left pleural effusion  Renal insufficiency  Normochromic normocytic anemia  Elevated blood pressure reading with diagnosis of hypertension  Elevated transaminase level  Alkaline phosphatase elevation    Rx / DC Orders ED Discharge Orders    None       Delora Fuel, MD 37/04/88  [phone]

## 2019-10-30 NOTE — ED Notes (Signed)
Patient verbalizes understanding of discharge instructions. Opportunity for questioning and answers were provided. Armband removed by staff, pt discharged from ED via wheelchair and returned to lobby to go home with spouse.

## 2019-10-30 NOTE — ED Notes (Signed)
Pt refused strep test, states " I had it done at Urgent Care yesterday"

## 2019-11-01 ENCOUNTER — Emergency Department (HOSPITAL_COMMUNITY): Payer: PPO

## 2019-11-01 ENCOUNTER — Other Ambulatory Visit: Payer: Self-pay

## 2019-11-01 ENCOUNTER — Emergency Department (HOSPITAL_COMMUNITY)
Admission: EM | Admit: 2019-11-01 | Discharge: 2019-11-01 | Disposition: A | Discharge status: Home / Self Care | Payer: PPO | Attending: Emergency Medicine | Admitting: Emergency Medicine

## 2019-11-01 ENCOUNTER — Encounter (HOSPITAL_COMMUNITY): Payer: Self-pay | Admitting: Emergency Medicine

## 2019-11-01 DIAGNOSIS — I129 Hypertensive chronic kidney disease with stage 1 through stage 4 chronic kidney disease, or unspecified chronic kidney disease: Secondary | ICD-10-CM | POA: Diagnosis not present

## 2019-11-01 DIAGNOSIS — R0902 Hypoxemia: Secondary | ICD-10-CM | POA: Insufficient documentation

## 2019-11-01 DIAGNOSIS — Z79899 Other long term (current) drug therapy: Secondary | ICD-10-CM | POA: Diagnosis not present

## 2019-11-01 DIAGNOSIS — N1832 Chronic kidney disease, stage 3b: Secondary | ICD-10-CM | POA: Diagnosis not present

## 2019-11-01 DIAGNOSIS — I2510 Atherosclerotic heart disease of native coronary artery without angina pectoris: Secondary | ICD-10-CM | POA: Insufficient documentation

## 2019-11-01 DIAGNOSIS — E785 Hyperlipidemia, unspecified: Secondary | ICD-10-CM | POA: Insufficient documentation

## 2019-11-01 DIAGNOSIS — R600 Localized edema: Secondary | ICD-10-CM | POA: Insufficient documentation

## 2019-11-01 DIAGNOSIS — R0602 Shortness of breath: Secondary | ICD-10-CM | POA: Insufficient documentation

## 2019-11-01 DIAGNOSIS — E1122 Type 2 diabetes mellitus with diabetic chronic kidney disease: Secondary | ICD-10-CM | POA: Insufficient documentation

## 2019-11-01 DIAGNOSIS — R059 Cough, unspecified: Secondary | ICD-10-CM

## 2019-11-01 DIAGNOSIS — J90 Pleural effusion, not elsewhere classified: Secondary | ICD-10-CM | POA: Diagnosis not present

## 2019-11-01 DIAGNOSIS — E1169 Type 2 diabetes mellitus with other specified complication: Secondary | ICD-10-CM | POA: Diagnosis not present

## 2019-11-01 DIAGNOSIS — R05 Cough: Secondary | ICD-10-CM | POA: Insufficient documentation

## 2019-11-01 LAB — CBC WITH DIFFERENTIAL/PLATELET
Abs Immature Granulocytes: 0.01 10*3/uL (ref 0.00–0.07)
Basophils Absolute: 0 10*3/uL (ref 0.0–0.1)
Basophils Relative: 0 %
Eosinophils Absolute: 0.4 10*3/uL (ref 0.0–0.5)
Eosinophils Relative: 8 %
HCT: 37.1 % — ABNORMAL LOW (ref 39.0–52.0)
Hemoglobin: 11.7 g/dL — ABNORMAL LOW (ref 13.0–17.0)
Immature Granulocytes: 0 %
Lymphocytes Relative: 17 %
Lymphs Abs: 0.9 10*3/uL (ref 0.7–4.0)
MCH: 29.3 pg (ref 26.0–34.0)
MCHC: 31.5 g/dL (ref 30.0–36.0)
MCV: 93 fL (ref 80.0–100.0)
Monocytes Absolute: 0.6 10*3/uL (ref 0.1–1.0)
Monocytes Relative: 11 %
Neutro Abs: 3.1 10*3/uL (ref 1.7–7.7)
Neutrophils Relative %: 64 %
Platelets: 183 10*3/uL (ref 150–400)
RBC: 3.99 MIL/uL — ABNORMAL LOW (ref 4.22–5.81)
RDW: 14.4 % (ref 11.5–15.5)
WBC: 4.9 10*3/uL (ref 4.0–10.5)
nRBC: 0 % (ref 0.0–0.2)

## 2019-11-01 LAB — COMPREHENSIVE METABOLIC PANEL
ALT: 67 U/L — ABNORMAL HIGH (ref 0–44)
AST: 71 U/L — ABNORMAL HIGH (ref 15–41)
Albumin: 3 g/dL — ABNORMAL LOW (ref 3.5–5.0)
Alkaline Phosphatase: 198 U/L — ABNORMAL HIGH (ref 38–126)
Anion gap: 10 (ref 5–15)
BUN: 14 mg/dL (ref 8–23)
CO2: 24 mmol/L (ref 22–32)
Calcium: 8.7 mg/dL — ABNORMAL LOW (ref 8.9–10.3)
Chloride: 102 mmol/L (ref 98–111)
Creatinine, Ser: 1.35 mg/dL — ABNORMAL HIGH (ref 0.61–1.24)
GFR calc Af Amer: 55 mL/min — ABNORMAL LOW (ref >60)
GFR calc non Af Amer: 48 mL/min — ABNORMAL LOW (ref >60)
Glucose, Bld: 103 mg/dL — ABNORMAL HIGH (ref 70–99)
Potassium: 3.9 mmol/L (ref 3.5–5.1)
Sodium: 136 mmol/L (ref 135–145)
Total Bilirubin: 0.6 mg/dL (ref 0.3–1.2)
Total Protein: 5.6 g/dL — ABNORMAL LOW (ref 6.5–8.1)

## 2019-11-01 LAB — BRAIN NATRIURETIC PEPTIDE: B Natriuretic Peptide: 105.9 pg/mL — ABNORMAL HIGH (ref 0.0–100.0)

## 2019-11-01 IMAGING — DX DG CHEST 1V PORT
1 series · 1 of 1 positions shown · non-contrast
Comparison: [DATE] and prior radiographs.

CLINICAL DATA: Increasing shortness of breath and cough.

EXAM:
PORTABLE CHEST 1 VIEW

[chest]
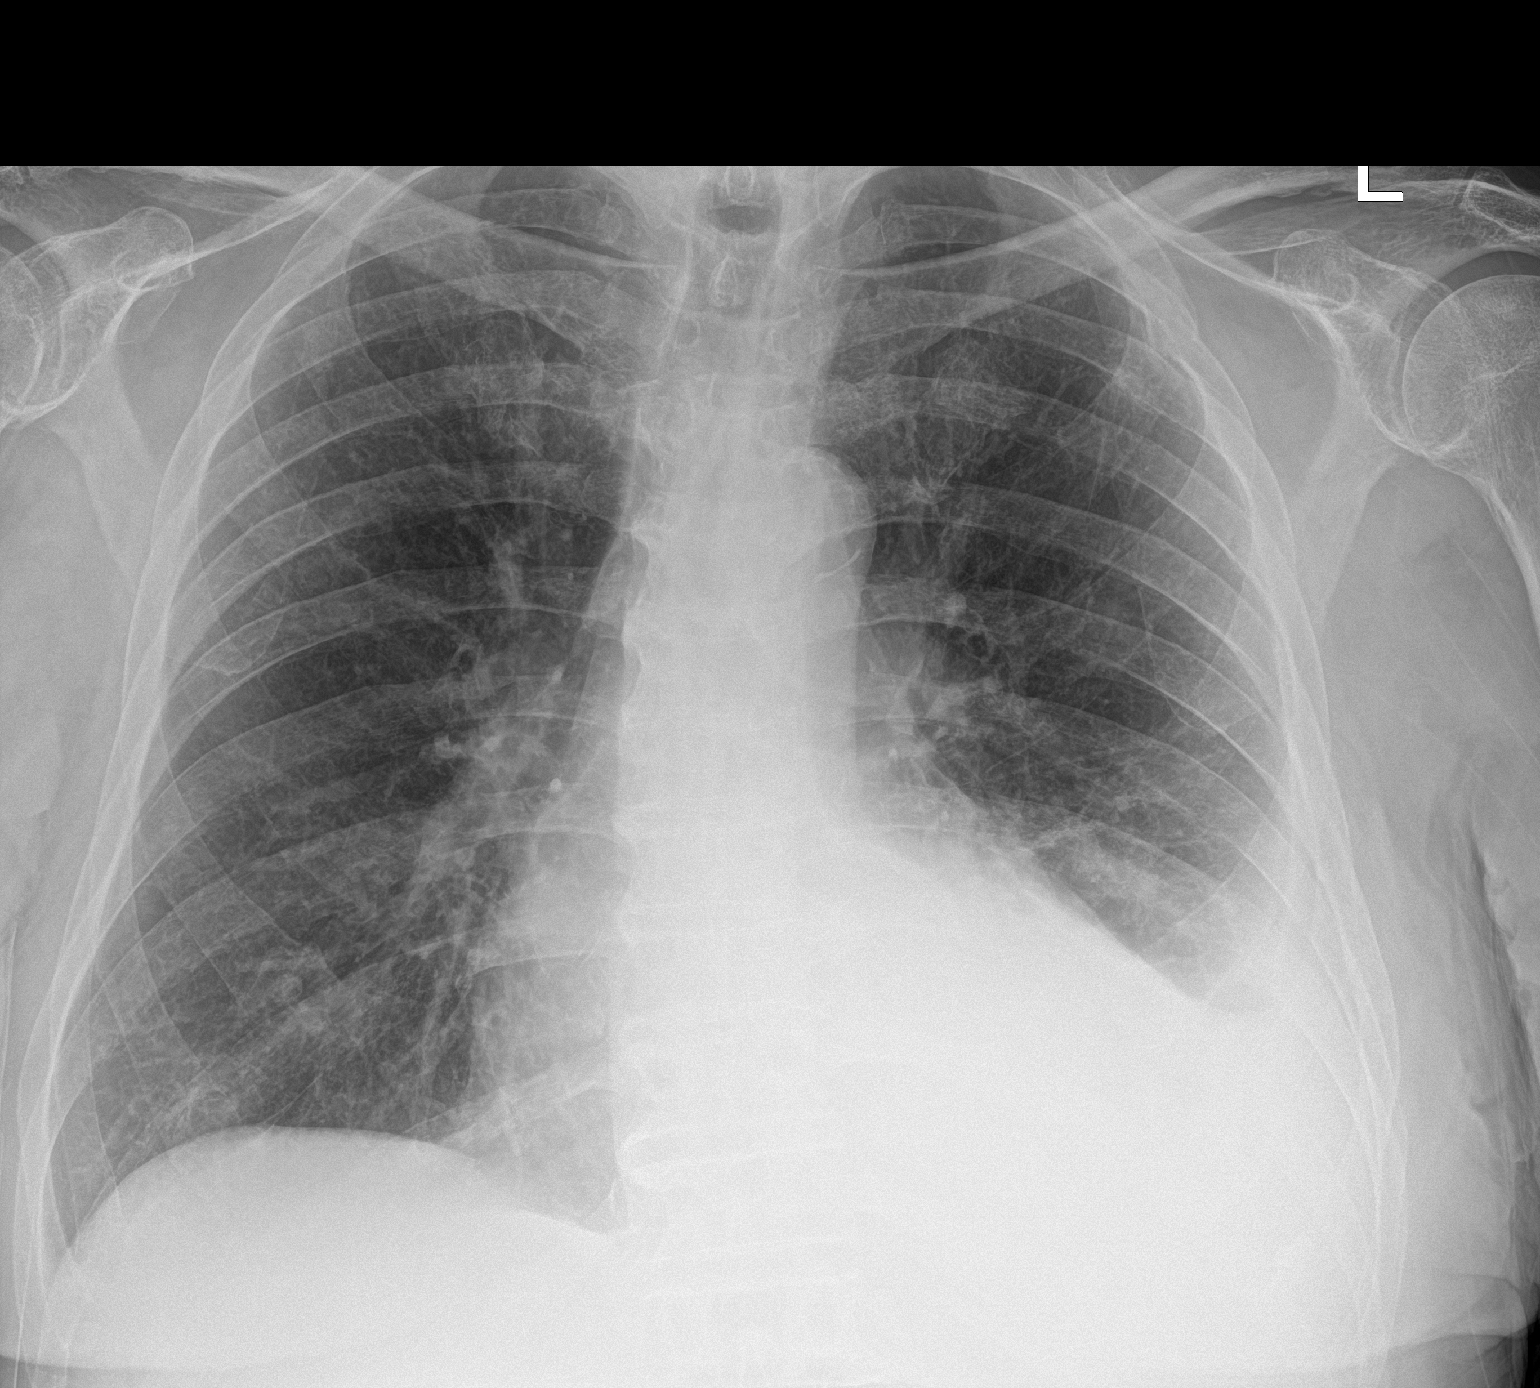

[1 of 1 positions shown; findings below may reference images not displayed]

FINDINGS: The cardiomediastinal silhouette is unchanged.

A small LEFT pleural effusion and LEFT LOWER lung
consolidation/atelectasis are again identified.

The RIGHT lung is clear.

There is no evidence of pneumothorax.

No acute bony abnormalities are present. Remote LEFT rib fractures
are again identified.
IMPRESSION: Unchanged small LEFT pleural effusion and LEFT LOWER lung
consolidation/atelectasis.

## 2019-11-01 MED ORDER — AMOXICILLIN-POT CLAVULANATE 875-125 MG PO TABS: 1.0000 | ORAL_TABLET | Freq: Two times a day (BID) | ORAL | 0 refills | Status: AC

## 2019-11-01 NOTE — ED Triage Notes (Signed)
Family reports pt was seen in ED on Thursday.  Reports cough is now worse and is "wet".  Checking O2 sats at home and states they were below 94% since last night and dropped down to 89% this morning.  Reports mild SOB and pain to lateral sides.  Negative COVID test Thursday and Friday.

## 2019-11-01 NOTE — ED Provider Notes (Signed)
[facility]ERG[facility] DEPARTMENT Provider Note   CSN: [id number] Arrival date & time: 11/01/19  0272     History Chief Complaint  Patient presents with  . Low Sats  . Cough    [name] is a 86 y.o. male presents today with intermittent coughing and mild shortness of breath.  He was seen in the emergency department last week for similar symptoms., and had an x-ray showing mild residual pleural effusion in the left lung base.  He is returning this morning with concern for oxygen saturation dropping to 89% while at home this morning, additionally for worsening cough that kept him up for significant portion of the night.  Additional history was collected from his wife who was at bedside, she states that his oxygen saturation has stayed in the low 90s between 91 and 94% for the last few days while at rest.  She became concerned when his oxygen saturation dropped to 89% while sitting in his recliner chair this morning. He denies fever, chills, nausea, vomiting, diarrhea.  He has been fully vaccinated against Covid-19, he had 2 negative Covid tests last week.  He tested negative for flu a and B, and strep pharyngitis. 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  He suspects he got a "bug" from one of the children in his care.  Personally reviewed [name] medical record; he has a history of hypertension, hyperlipidemia, CAD.  HPI     Past Medical History:  Diagnosis Date  . Allergy   . BPH (benign prostatic hyperplasia)   . BPH (benign prostatic hypertrophy)   . CAD (coronary artery disease)    s/p cypher DES to pLAD 6/08; normal LVF;  ETT-Myoview 2009: no ischemia   . Coronary artery disease   . Eosinophilia   . High cholesterol   . HTN (hypertension)   . Hyperlipidemia   . Hypertension   . MI (myocardial infarction) ([place])   . Myocardial infarction ([place])     . Prediabetes   . Trigeminal neuralgia     Patient Active Problem List   Diagnosis Date Noted  . Fracture of multiple ribs with pain 08/10/2019  . Clavicle fracture 08/10/2019  . Closed displaced fracture of phalanx of left thumb, sequela 08/10/2019  . TBI (traumatic brain injury) ([place]) 07/23/2019  . Traumatic closed fracture of distal clavicle with minimal displacement, left, initial encounter 07/19/2019  . Traumatic brain injury with loss of consciousness ([place])   . Multiple trauma   . Benign prostatic hyperplasia   . Essential hypertension   . AKI (acute kidney injury) ([place])   . Stage 3b chronic kidney disease   . Prediabetes   . Acute blood loss anemia   . Thrombocytopenia ([place])   . Fall 07/14/2019  . Type 2 diabetes mellitus ([place])   . Eosinophilia   . Need for prophylactic vaccination and inoculation against influenza 11/12/2013  . Nocturnal leg cramps 11/23/2012  . Pain in joint, shoulder region 11/23/2012  . Fall 11/04/2012  . Hematoma 11/04/2012  . Acute upper respiratory infections of unspecified site 11/04/2012  . HYPERLIPIDEMIA TYPE IIB / III 03/08/2008  . Orthostatic hypotension 03/08/2008  . CAD, NATIVE VESSEL 03/08/2008    Past Surgical History:  Procedure Laterality Date  . APPENDECTOMY    . CARDIAC CATHETERIZATION  07/30/2006   CORONARY ANGIOPLASTY WITH STENT PLACEMENT  . CARDIAC CATHETERIZATION    .  EXPLORATORY LAPAROTOMY     age 79  . EXPLORATORY LAPAROTOMY    . IR THORACENTESIS ASP PLEURAL SPACE W/IMG GUIDE  10/05/2019       Family History  Problem Relation Age of Onset  . Stomach cancer Mother   . Heart disease Father   . Heart disease Brother   . Aortic aneurysm Brother   . Colon cancer Brother   . Coronary artery disease Father 26  . Coronary artery disease Brother   . Aortic aneurysm Brother   . Colon cancer Brother   . Esophageal cancer Neg Hx   . Rectal cancer Neg Hx   . Arthritis Daughter        rheumatoid    Social History    Tobacco Use  . Smoking status: Never Smoker  . Smokeless tobacco: Never Used  . Tobacco comment: quit over 5yrs ago.  Substance Use Topics  . Alcohol use: Never  . Drug use: Never    Home Medications Prior to Admission medications   Medication Sig Start Date End Date Taking? Authorizing Provider  acetaminophen (TYLENOL) 500 MG tablet Take 1,000 mg by mouth every 6 (six) hours as needed for moderate pain.    [provider]  albuterol (PROAIR HFA) 108 (90 Base) MCG/ACT inhaler INHALE 2 PUFFS INTO THE LUNGS EVERY 6 HOURS AS NEEDED FOR WHEEZING OR SHORTNESS OF BREATH Patient taking differently: Inhale 2 puffs into the lungs every 6 (six) hours as needed for wheezing or shortness of breath. INHALE 2 PUFFS INTO THE LUNGS EVERY 6 HOURS AS NEEDED FOR WHEEZING OR SHORTNESS OF BREATH 08/06/19   Angiulli, Lavon Paganini, PA-C  Apoaequorin (PREVAGEN) 10 MG CAPS Take 10 mg by mouth daily.    [provider]  atorvastatin (LIPITOR) 40 MG tablet Take 40 mg by mouth daily.    [provider]  Calcium-Vitamin D 600-200 MG-UNIT tablet Take 1 tablet by mouth 2 (two) times daily. Patient taking differently: Take 1 tablet by mouth daily.  08/06/19   Angiulli, Lavon Paganini, PA-C  doxazosin (CARDURA) 2 MG tablet Take 1 tablet (2 mg total) by mouth daily. 08/06/19   Angiulli, Lavon Paganini, PA-C  escitalopram (LEXAPRO) 10 MG tablet TAKE 1 TABLET BY MOUTH EVERY DAY Patient taking differently: Take 10 mg by mouth daily.  09/15/19   [name], MD  ezetimibe (ZETIA) 10 MG tablet Take 1 tablet (10 mg total) by mouth daily. Please make yearly appt with Dr. [name] for October for future refills. 1st attempt 08/03/19   Sherren Mocha, MD  fluticasone furoate-vilanterol (BREO ELLIPTA) 200-25 MCG/INH AEPB TAKE 1 PUFF BY MOUTH EVERY DAY 10/30/19   [name], MD  Multiple Vitamin (MULTIVITAMIN WITH MINERALS) TABS tablet Take 1 tablet by mouth daily.    [provider]    Allergies     Gabapentin  Review of Systems   Review of Systems  Constitutional: Negative for chills and fever.  HENT: Negative for congestion, ear pain, mouth sores, sore throat and trouble swallowing.   Eyes: Negative.   Respiratory: Positive for cough and shortness of breath. Negative for chest tightness.   Cardiovascular: Positive for leg swelling. Negative for chest pain and palpitations.  Gastrointestinal: Negative for abdominal pain, diarrhea, nausea and vomiting.  Genitourinary: Negative for dysuria.  Musculoskeletal: Negative.   Skin: Negative.   Neurological: Positive for headaches. Negative for syncope, weakness and light-headedness.    Physical Exam Updated Vital Signs BP 130/85 (BP Location: Right Arm)   Pulse  87   Temp 98.1 F (36.7 C) (Oral)   Resp 16   Ht 5' 7.5" (1.715 m)   Wt 78.5 kg   SpO2 95%   BMI 26.70 kg/m   Physical Exam Vitals and nursing note reviewed.  HENT:     Head: Normocephalic and atraumatic.     Mouth/Throat:     Mouth: Mucous membranes are moist.     Pharynx: No oropharyngeal exudate or posterior oropharyngeal erythema.  Eyes:     General:        Right eye: No discharge.        Left eye: No discharge.     Conjunctiva/sclera: Conjunctivae normal.  Cardiovascular:     Rate and Rhythm: Normal rate and regular rhythm.     Pulses: Normal pulses.     Heart sounds: Normal heart sounds. No murmur heard.   Pulmonary:     Effort: Pulmonary effort is normal. No respiratory distress.     Breath sounds: Examination of the left-lower field reveals decreased breath sounds. Decreased breath sounds present. No wheezing, rhonchi or rales.  Abdominal:     General: Bowel sounds are normal. There is no distension.     Palpations: Abdomen is soft.     Tenderness: There is no abdominal tenderness.  Musculoskeletal:        General: No deformity. Normal range of motion.     Cervical back: Neck supple.     Right lower leg: 1+ Pitting Edema present.     Left lower  leg: 1+ Pitting Edema present.  Skin:    General: Skin is warm and dry.     Capillary Refill: Capillary refill takes less than 2 seconds.  Neurological:     Mental Status: He is alert. Mental status is at baseline.  Psychiatric:        Mood and Affect: Mood normal.     ED Results / Procedures / Treatments   Labs (all labs ordered are listed, but only abnormal results are displayed) Labs Reviewed  CBC WITH DIFFERENTIAL/PLATELET  COMPREHENSIVE METABOLIC PANEL  BRAIN NATRIURETIC PEPTIDE    EKG EKG Interpretation  Date/Time:  Sunday November 01 2019 09:19:23 EDT Ventricular Rate:  93 PR Interval:  150 QRS Duration: 94 QT Interval:  394 QTC Calculation: 489 R Axis:   3 Text Interpretation: Sinus rhythm with occasional Premature ventricular complexes Septal infarct , age undetermined Abnormal ECG since last tracing no significant change Confirmed by Noemi Chapel [phone]) on 11/01/2019 10:48:41 AM   Radiology DG Chest Portable 1 View  Result Date: 11/01/2019 CLINICAL DATA:  Increasing shortness of breath and cough. EXAM: PORTABLE CHEST 1 VIEW COMPARISON:  10/30/2019 and prior radiographs. FINDINGS: The cardiomediastinal silhouette is unchanged. A small LEFT pleural effusion and LEFT LOWER lung consolidation/atelectasis are again identified. The RIGHT lung is clear. There is no evidence of pneumothorax. No acute bony abnormalities are present. Remote LEFT rib fractures are again identified. IMPRESSION: Unchanged small LEFT pleural effusion and LEFT LOWER lung consolidation/atelectasis. Electronically Signed   By: Margarette Canada M.D.   On: 11/01/2019 10:36    Procedures Procedures (including critical care time)  Medications Ordered in ED Medications - No data to display  ED Course  I have reviewed the triage vital signs and the nursing notes.  Pertinent labs & imaging results that were available during my care of the patient were reviewed by me and considered in my medical  decision making (see chart for details).    MDM Rules/Calculators/A&P  [name] is an 86 year old male with history of CAD, who presents today with worsening shortness of breath and borderline hypoxia at home.  He has had 2 negative Covid tests, and is fully vaccinated COVID-19. Tested negative last week for influenza A/B, and strep pharyngitis.   Differential diagnosis for emergent cause of cough includes but is not limited to upper respiratory infection, lower respiratory infection, allergies, asthma, irritants, foreign body, medications such as ACE inhibitors, reflux, asthma, CHF, lung cancer, interstitial lung disease, psychiatric causes, postnasal drip and postinfectious bronchospasm.     On intake vital signs are normal - afebrile, oxygen saturation 95% on room air, without tachycardia or hypertension.  EKG with sinus rhythm, no change from prior reading.  Chest x-ray with small unchanged left-sided pleural effusion, and left lower lobe consolidation/atelectasis.  At time of my initial exam of patient he is resting comfortably and is hospital bed.  He is able again to begin full sentences without difficulty, has noincreased work of breathing.  Physical exam revealed normal cardiac exam, pulmonary exam significant for decreased breath sounds in the left lower lobe.  Otherwise lungs sound clear.  There is biateral 1+ pitting edema of the lower extremities, which he states is worse for him than normal.  No history of CHF.  CBC without leukocytosis, significant for mild anemia of 11.7, previously 12.2.  BMP with elevated creatinine of 1.35, previously 6.  Transaminase levels mildly elevated, consistent with the patient's laboratory results from last few months.  BNP of 105.9, unremarkable.  Ambulatory oxygen saturation remained above 94% on room air.  Given reassuring laboratory studies, physical exam, and vital signs, I do not feel any further work-up in the  emergency department is necessary at this time.  I will discharge the patient home with 7-day course of Augmentin, for possible bacterial component to his upper respiratory infection, in the context of left lower lobe consolidation on chest x-ray.  Given this patient was staffed with attending physician Dr. Noemi Chapel.   I discussed patient's laboratory and imaging results with him and his wife.  They each voiced understanding of the treatment plan, and were amenable to discharge at this time.  Each of their questions were answered to her expressed satisfaction.   Strict return precautions were provided. Patient should follow up with his primary care doctor in 1 week. The patient is stable for discharge from the emergency department at this time.  [name] was evaluated in Emergency Department on 11/01/2019 for the symptoms described in the history of present illness. He was evaluated in the context of the global COVID-19 pandemic, which necessitated consideration that the patient might be at risk for infection with the SARS-CoV-2 virus that causes COVID-19. Institutional protocols and algorithms that pertain to the evaluation of patients at risk for COVID-19 are in a state of rapid change based on information released by regulatory bodies including the CDC and federal and state organizations. These policies and algorithms were followed during the patient's care in the ED.   Final Clinical Impression(s) / ED Diagnoses Final diagnoses:  None    Rx / DC Orders ED Discharge Orders    None       Aura Dials 11/01/19 1650    Noemi Chapel, MD 11/08/19 1454

## 2019-11-01 NOTE — ED Provider Notes (Signed)
SAt's were low at home - 89% at home - recent ED visit - no covid, flu or strep.  No worsening breathing etc.  His cough is more "wet" - not coughing here very much.  Lungs with lower bilateral rales, no tachycardia, pulse ox 95%, normotensive and not febrile.  Has slilght pitting edema.  Labs, BNP, CXR without changes,  No abx at recent visit -   Medical screening examination/treatment/procedure(s) were conducted as a shared visit with non-physician practitioner(s) and myself.  I personally evaluated the patient during the encounter.  Clinical Impression:   Final diagnoses:  None           Noemi Chapel, MD 11/08/19 1454

## 2019-11-01 NOTE — ED Provider Notes (Signed)
Well-appearing 86 year old male presenting with intermittent coughing intermittent shortness of breath and intermittent hypoxia, he has not had a fever in a couple of days, was recently seen in the emergency department and had an x-ray showing some residual effusion in the left base.  Today the patient has clear lungs on my exam, there is a slight decreased breath sound at the left base consistent with prior effusion and today's x-ray.  He otherwise appears well, vital signs are normal, speaks in full sentences, anticipate discharge, well-appearing.  Medical screening examination/treatment/procedure(s) were conducted as a shared visit with non-physician practitioner(s) and myself.  I personally evaluated the patient during the encounter.  Clinical Impression:   Final diagnoses:  Cough         Noemi Chapel, MD 11/08/19 1454

## 2019-11-01 NOTE — Discharge Instructions (Addendum)
You are seen today in the emergency department for persistent shortness of breath and cough.  Your medical work-up in the emergency department was reassuring.  Prescribed Augmentin, which is an antibiotic.  You should pick up your prescription today and should complete the entire 7-day course.  Please follow-up with your primary care physician within the next week, for reevaluation of the lungs.    You should return the emergency department immediately should she develop chest pain, shortness of breath, worsening cough, worsening fever, intractable nausea or vomiting.

## 2019-11-02 ENCOUNTER — Telehealth: Payer: Self-pay | Admitting: Pulmonary Disease

## 2019-11-02 NOTE — Telephone Encounter (Signed)
Daughter, [name], calling because her mother is dying of Covid. They might need to cancel the upcoming appointment, they will call us back later today once they have more details.

## 2019-11-03 ENCOUNTER — Ambulatory Visit: Payer: PPO | Admitting: Occupational Therapy

## 2019-11-03 ENCOUNTER — Encounter: Payer: Self-pay | Admitting: Occupational Therapy

## 2019-11-03 ENCOUNTER — Other Ambulatory Visit: Payer: Self-pay

## 2019-11-03 ENCOUNTER — Ambulatory Visit: Payer: PPO

## 2019-11-03 ENCOUNTER — Encounter: Payer: PPO | Admitting: Speech Pathology

## 2019-11-03 DIAGNOSIS — R29818 Other symptoms and signs involving the nervous system: Secondary | ICD-10-CM

## 2019-11-03 DIAGNOSIS — R2681 Unsteadiness on feet: Secondary | ICD-10-CM

## 2019-11-03 DIAGNOSIS — M6281 Muscle weakness (generalized): Secondary | ICD-10-CM | POA: Diagnosis not present

## 2019-11-03 DIAGNOSIS — M25632 Stiffness of left wrist, not elsewhere classified: Secondary | ICD-10-CM

## 2019-11-03 DIAGNOSIS — M79642 Pain in left hand: Secondary | ICD-10-CM

## 2019-11-03 DIAGNOSIS — R262 Difficulty in walking, not elsewhere classified: Secondary | ICD-10-CM

## 2019-11-03 DIAGNOSIS — R2689 Other abnormalities of gait and mobility: Secondary | ICD-10-CM

## 2019-11-03 DIAGNOSIS — M25642 Stiffness of left hand, not elsewhere classified: Secondary | ICD-10-CM

## 2019-11-03 NOTE — Therapy (Signed)
[facility] [address] [place], [place], [address] Phone: [phone]   Fax:  [phone]  Occupational Therapy Treatment  Patient Details  Name: [name] MRN: [id number] Date of Birth: March 19, 1933 Referring Provider (OT): Reesa Chew   Encounter Date: 11/03/2019   OT End of Session - 11/03/19 1745    Visit Number 15    Number of Visits 17    Date for OT Re-Evaluation 12/27/19    Authorization Type [employer] - Follow Medicare guidelines    Authorization - Visit Number 15    Authorization - Number of Visits 20    Progress Note Due on Visit 20    OT Start Time 1700    OT Stop Time 1745    OT Time Calculation (min) 45 min    Activity Tolerance Patient tolerated treatment well    Behavior During Therapy [facility] for tasks assessed/performed;Impulsive           Past Medical History:  Diagnosis Date  . Allergy   . BPH (benign prostatic hyperplasia)   . BPH (benign prostatic hypertrophy)   . CAD (coronary artery disease)    s/p cypher DES to pLAD 6/08; normal LVF;  ETT-Myoview 2009: no ischemia   . Coronary artery disease   . Eosinophilia   . High cholesterol   . HTN (hypertension)   . Hyperlipidemia   . Hypertension   . MI (myocardial infarction) ([place])   . Myocardial infarction ([place])   . Prediabetes   . Trigeminal neuralgia     Past Surgical History:  Procedure Laterality Date  . APPENDECTOMY    . CARDIAC CATHETERIZATION  07/30/2006   CORONARY ANGIOPLASTY WITH STENT PLACEMENT  . CARDIAC CATHETERIZATION    . EXPLORATORY LAPAROTOMY     age 86  . EXPLORATORY LAPAROTOMY    . IR THORACENTESIS ASP PLEURAL SPACE W/IMG GUIDE  10/05/2019    There were no vitals filed for this visit.   Subjective Assessment - 11/03/19 1704    Currently in Pain? No/denies    Pain Score 0-No pain                        OT Treatments/Exercises (OP) - 11/03/19 0001      Neurological Re-education  Exercises   Other Exercises 1 Active assisted range of motion with UE ranger to address reach patterns - shoulder flexion with elbow extension.  Patient experiencing discomfort beyond 110 shoulder flex abd.        Moist Heat Therapy   Number Minutes Moist Heat 10 Minutes    Moist Heat Location Shoulder      Manual Therapy   Manual Therapy Passive ROM    Joint Mobilization --   gentle clavicle mobility, and scapular mobilization   Passive ROM Very gentle range of motion to left glenohumeral joint to address IR/ER, ABD, FLEX - patient supine.  Patient with less muscle guarding this session, able to use breathing to enhance mobility throughout shoulder girdle.                      OT Short Term Goals - 11/03/19 1802      OT SHORT TERM GOAL #1   Title Patient will complete an HEP designed to improve left thumb range of motion with min cueing due 09/27/19    Time 4    Period Weeks    Status Achieved    Target Date 09/27/19  OT SHORT TERM GOAL #2   Title Patient will complete a home exercise program designed to improve coordination in left hand with set up assistance    Time 4    Period Weeks    Status Achieved      OT SHORT TERM GOAL #3   Title Patient will don/doff a pull over shirt with set up assistance    Period Weeks    Status Achieved      OT SHORT TERM GOAL #4   Title Patient will demonstrate low reach to obtain a lightweight (less than 3 lb) object with left hand    Time 4    Period Weeks    Status Achieved             OT Long Term Goals - 11/03/19 1802      OT LONG TERM GOAL #1   Title Patient will complete HEP/Home activities program to improve functional mobility and use of left hand    Status On-going    Target Date 12/27/19      OT LONG TERM GOAL #2   Title Patient will bathe and dress himself with modified independence - with increased time    Time 8    Period Weeks    Status Achieved      OT LONG TERM GOAL #3   Title Patient will  prepare a familiar hot meal using left hand as non dominant with supervision    Time 8    Period Weeks    Status Achieved      OT LONG TERM GOAL #4   Title Patient will demonstrate ability to manipulate small items within left hand as evidenced by his ability to complete 9 hole peg test in less than 1 minute    Time 8    Period Weeks    Status Achieved      OT LONG TERM GOAL #5   Title Patient will understand recommendations related to driving    Time 8    Period Weeks    Status Achieved                  Patient will benefit from skilled therapeutic intervention in order to improve the following deficits and impairments:           Visit Diagnosis: Muscle weakness (generalized) - Plan: Ot plan of care cert/re-cert  Unsteadiness on feet - Plan: Ot plan of care cert/re-cert  Pain in left hand - Plan: Ot plan of care cert/re-cert  Stiffness of left hand, not elsewhere classified - Plan: Ot plan of care cert/re-cert  Other symptoms and signs involving the nervous system - Plan: Ot plan of care cert/re-cert  Stiffness of left wrist, not elsewhere classified - Plan: Ot plan of care cert/re-cert    Problem List Patient Active Problem List   Diagnosis Date Noted  . Fracture of multiple ribs with pain 08/10/2019  . Clavicle fracture 08/10/2019  . Closed displaced fracture of phalanx of left thumb, sequela 08/10/2019  . TBI (traumatic brain injury) ([place]) 07/23/2019  . Traumatic closed fracture of distal clavicle with minimal displacement, left, initial encounter 07/19/2019  . Traumatic brain injury with loss of consciousness ([place])   . Multiple trauma   . Benign prostatic hyperplasia   . Essential hypertension   . AKI (acute kidney injury) ([place])   . Stage 3b chronic kidney disease   . Prediabetes   . Acute blood loss anemia   . Thrombocytopenia ([place])   .  Fall 07/14/2019  . Type 2 diabetes mellitus ([place])   . Eosinophilia   . Need for prophylactic vaccination  and inoculation against influenza 11/12/2013  . Nocturnal leg cramps 11/23/2012  . Pain in joint, shoulder region 11/23/2012  . Fall 11/04/2012  . Hematoma 11/04/2012  . Acute upper respiratory infections of unspecified site 11/04/2012  . HYPERLIPIDEMIA TYPE IIB / III 03/08/2008  . Orthostatic hypotension 03/08/2008  . CAD, NATIVE VESSEL 03/08/2008    Mariah Milling, OTR/L 11/03/2019, 6:06 PM  [facility] [address] [place], [place], [address] Phone: [phone]   Fax:  [phone]  Name: [name] MRN: [id number] Date of Birth: [birth year]

## 2019-11-03 NOTE — Therapy (Signed)
[facility] [address] [place], [place], [address] Phone: [phone]   Fax:  [phone]  Physical Therapy Treatment  Patient Details  Name: [name] MRN: [id number] Date of Birth: [birth year] Referring Provider (PT): Reesa Chew PA-C   Encounter Date: 11/03/2019   PT End of Session - 11/03/19 1622    Visit Number 15    Number of Visits 17    Date for PT Re-Evaluation 12/13/19   Updated POC for 4 weeks, Cert for 60 days   Authorization Type [employer] - $15 co pay, collect 1 co pay per day for multiple disciplines    PT Start Time 1615    PT Stop Time 1659    PT Time Calculation (min) 44 min    Equipment Utilized During Treatment Gait belt    Activity Tolerance Patient tolerated treatment well    Behavior During Therapy [facility] for tasks assessed/performed;Impulsive           Past Medical History:  Diagnosis Date  . Allergy   . BPH (benign prostatic hyperplasia)   . BPH (benign prostatic hypertrophy)   . CAD (coronary artery disease)    s/p cypher DES to pLAD 6/08; normal LVF;  ETT-Myoview 2009: no ischemia   . Coronary artery disease   . Eosinophilia   . High cholesterol   . HTN (hypertension)   . Hyperlipidemia   . Hypertension   . MI (myocardial infarction) ([place])   . Myocardial infarction ([place])   . Prediabetes   . Trigeminal neuralgia     Past Surgical History:  Procedure Laterality Date  . APPENDECTOMY    . CARDIAC CATHETERIZATION  07/30/2006   CORONARY ANGIOPLASTY WITH STENT PLACEMENT  . CARDIAC CATHETERIZATION    . EXPLORATORY LAPAROTOMY     age 86  . EXPLORATORY LAPAROTOMY    . IR THORACENTESIS ASP PLEURAL SPACE W/IMG GUIDE  10/05/2019    There were no vitals filed for this visit.   Subjective Assessment - 11/03/19 1620    Subjective Patient reports that has been sick, was tested for COVID but come back negative. Just a cold and is taking an antibiotic. No falls or  pain to report.    Patient is accompained by: Family member    Pertinent History PMH: BPH, CAD, HTN, CKD III, prediabetes, mild cognitive impairment.    Patient Stated Goals wants to walk without worrying about potentially having a dizzy spell - reports 4-5 spells a day that lasts only a couple of seconds.    Currently in Pain? No/denies    Pain Onset In the past 7 days                             [facility] Adult PT Treatment/Exercise - 11/03/19 0001      Ambulation/Gait   Ambulation/Gait Yes    Ambulation/Gait Assistance 5: Supervision    Ambulation/Gait Assistance Details throughout therapy session with activities, and completed gait training x 200 ft without AD. Patient HR prior to ambulation was 86 bpm. AFter ambulation HR: 102 bpm. No shortness of breath noted    Ambulation Distance (Feet) 200 Feet    Assistive device None    Gait Pattern Step-through pattern;Decreased stride length;Decreased arm swing - left    Ambulation Surface Level;Indoor    Gait Comments Patient demonstrating some increased fatigue today due to congestion/cold symptoms. Patient HR at rest was 87, with activity stayed in  100-108 range.       High Level Balance   High Level Balance Activities Negotiating over obstacles;Head turns    High Level Balance Comments Completed ambulation with negotiating and stepping over orange hurdles followed by stepping over cones with toe tap prior to stepping over to further promote SLS, completed 5 laps x 35'. Verbal cues required for proper step length, as patient demo instances of stepping over obstacle to early leading to patient over striding, able to correct with verbal cues. Completed ambulation without AD, completed horizontal/vertical head turns 1 x 115 ft each to promote improved balance and scanning on environment with ambulation.       Neuro Re-ed    Neuro Re-ed Details  Standing on incline on blue mat: completed alternating marching with EO x 10 reps,  followed by EC x 10 reps, increased balance challenge noted iwth vision removed. Completed with narrow BOS horizontal/vertical head turns with eyes open x 10 reps each, progressing to completing with eyes closed x 10 reps each. Increased balance challenge noted with vertical head turns. Standing on incline on blue mat, completed alternating toe taps to colored pebbles x 10 reps, progressing to completing crossover toe taps without UE support x 15 reps bilaterally. Increased balance challenge require CGA intermittent but patient able to maintain balance well. At countertop with single UE support on L, completed SLS activity with opposite LE placed on soccer ball, completed fwd/back rolls x 10 reps on BLE. Progressed to completing lateral rolls x 10 reps on BLE. Patient require UE support for placement of lower extremity, but able to complete without UE support during task. Verbal cues required for proper completion at times.                     PT Short Term Goals - 10/14/19 1625      PT SHORT TERM GOAL #1   Title STG = LTG             PT Long Term Goals - 10/14/19 1621      PT LONG TERM GOAL #1   Title Pt will be independent with final HEP in order to build upon functional gains made in therapy. ALL LTGS DUE 10/14/19    Baseline continue to progress HEP at this time    Time 4    Period Weeks    Status On-going    Target Date 11/11/19      PT LONG TERM GOAL #2   Title Patient will improve FGA to >/= 24/30 to demonstrate improved balance and reduced fall risk    Baseline 20/30    Time 4    Period Weeks    Status New      PT LONG TERM GOAL #3   Title Patient will be able to hold situation 3 of M-CSTIB for full 30 seconds, and situation 4 for >/= 15 seconds to demonstrate improved balance    Baseline situation 3: 26 seconds, situation 4: </= 5 seconds    Time 4    Period Weeks    Status New      PT LONG TERM GOAL #4   Title --    Baseline --    Status --      PT LONG  TERM GOAL #5   Title --    Baseline --    Status --                 Plan - 11/03/19 1725  Clinical Impression Statement Today's skilled PT session included continued balance exercises on incline on mat and with vision removed as tolerated by patient. Continued dynamic gait activities working on negotiating over obstacles, and activities to promote SLS. Patient did well with SLS stance activities today. Mild fatigue noted due to patient currently having a cold. Will continue to progress toward all goals.    Personal Factors and Comorbidities Comorbidity 3+;Past/Current Experience;Behavior Pattern    Comorbidities BPH, CAD, HTN, CKD III, prediabetes, mild cognitive impairment,    Examination-Activity Limitations Transfers;Stairs;Locomotion Level    Examination-Participation Restrictions Community Activity    Stability/Clinical Decision Making Evolving/Moderate complexity    Rehab Potential Good    PT Frequency 1x / week    PT Duration 4 weeks    PT Treatment/Interventions ADLs/Self Care Home Management;DME Instruction;Stair training;Therapeutic activities;Functional mobility training;Gait training;Therapeutic exercise;Balance training;Neuromuscular re-education;Patient/family education;Vestibular    PT Next Visit Plan Added visit due to missing last week, do not need to check LTG next visit. Will plan to check/discharge on last visit. SLS activities on compliant surfaces, eyes closed balance, compliant surfaces for incline, high level balance    PT Home Exercise Plan Access Code: [email or web address]    Consulted and Agree with Plan of Care Patient;Family member/caregiver    Family Member Consulted pt's spouse           Patient will benefit from skilled therapeutic intervention in order to improve the following deficits and impairments:  Abnormal gait, Decreased balance, Decreased activity tolerance, Decreased coordination, Decreased knowledge of use of DME, Decreased safety awareness,  Decreased strength, Difficulty walking, Dizziness, Impaired UE functional use  Visit Diagnosis: Muscle weakness (generalized)  Unsteadiness on feet  Difficulty in walking, not elsewhere classified  Other abnormalities of gait and mobility     Problem List Patient Active Problem List   Diagnosis Date Noted  . Fracture of multiple ribs with pain 08/10/2019  . Clavicle fracture 08/10/2019  . Closed displaced fracture of phalanx of left thumb, sequela 08/10/2019  . TBI (traumatic brain injury) ([place]) 07/23/2019  . Traumatic closed fracture of distal clavicle with minimal displacement, left, initial encounter 07/19/2019  . Traumatic brain injury with loss of consciousness ([place])   . Multiple trauma   . Benign prostatic hyperplasia   . Essential hypertension   . AKI (acute kidney injury) ([place])   . Stage 3b chronic kidney disease   . Prediabetes   . Acute blood loss anemia   . Thrombocytopenia ([place])   . Fall 07/14/2019  . Type 2 diabetes mellitus ([place])   . Eosinophilia   . Need for prophylactic vaccination and inoculation against influenza 11/12/2013  . Nocturnal leg cramps 11/23/2012  . Pain in joint, shoulder region 11/23/2012  . Fall 11/04/2012  . Hematoma 11/04/2012  . Acute upper respiratory infections of unspecified site 11/04/2012  . HYPERLIPIDEMIA TYPE IIB / III 03/08/2008  . Orthostatic hypotension 03/08/2008  . CAD, NATIVE VESSEL 03/08/2008    Jones Bales, PT, DPT 11/03/2019, 5:28 PM  [facility] [address] [place], [place], [address] Phone: [phone]   Fax:  [phone]  Name: [name] MRN: [id number] Date of Birth: [birth year]

## 2019-11-04 ENCOUNTER — Encounter: Payer: Self-pay | Admitting: Pulmonary Disease

## 2019-11-04 ENCOUNTER — Ambulatory Visit (INDEPENDENT_AMBULATORY_CARE_PROVIDER_SITE_OTHER): Payer: PPO | Admitting: Pulmonary Disease

## 2019-11-04 VITALS — BP 132/76 | HR 83 | Temp 98.4°F | Ht 67.5 in | Wt 183.0 lb

## 2019-11-04 DIAGNOSIS — R0600 Dyspnea, unspecified: Secondary | ICD-10-CM

## 2019-11-04 DIAGNOSIS — J90 Pleural effusion, not elsewhere classified: Secondary | ICD-10-CM | POA: Diagnosis not present

## 2019-11-04 DIAGNOSIS — R0609 Other forms of dyspnea: Secondary | ICD-10-CM

## 2019-11-04 LAB — CULTURE, BLOOD (ROUTINE X 2): Culture: NO GROWTH

## 2019-11-04 NOTE — Patient Instructions (Signed)
Nice to meet you!  Plan to come back Wednesday October 6 at 1130 and we will try to drain all that fluid away.

## 2019-11-04 NOTE — Progress Notes (Signed)
@Patient  ID: [name], male    DOB: December 15, 1933, 86 y.o.   MRN: [id number]  Chief Complaint  Patient presents with  . Consult    SOB comes and goes    Referring provider: Delora Fuel, MD  HPI:   [name] is an 86 year old man whom are seen in consultation at the request of Delora Fuel, MD for evaluation of recurrent left pleural effusion.  Notes from referring provider as well extensive notes from prior hospitalizations and ED visits reviewed.  Patient was hospitalized for some time and late June early July 2021 after fall.  Had hematoma on head.  CT chest scan reviewed at time of admission which demonstrates 2 peripheral left upper and left lower lobe areas of groundglass opacity possible consistent with contusion without pleural effusion otherwise clear on my interpretation.  He was discharged in the hospital early July 2021.  No chest X. Sharen Counter really in the interim.  Did have a thoracic spine film which showed no pleural effusion on the lateral view.  He represented to the ED in mid July x2.  He was having night sweats, fevers.  Chest x-ray at that time demonstrated new left-sided pleural effusion, otherwise clear on my interpretation.  He was given antibiotics.  Repeat chest x-ray couple weeks later that demonstrated larger left pleural effusion.  He went for IR thoracentesis late August 2021.  This is consistent with exudate with elevated protein and lymphocytic predominant cell count.  No malignant cells identified on cytology.  He has been to the ED twice in the last week or so with what sounds like mild cough, no fever.  Chest x-ray demonstrated enlarged compared to post IR thoracentesis but smaller than prior to thoracentesis left-sided pleural effusion.  On all films this appears loculated to my eye.  During prior thoracentesis 1.6 L was removed.  No clear indication for why this was stopped other than it seemed to be taking some time.  He denies any chest pain or  pressure, no neck or arm pain or pressure suggestive of lung entrapment.  Sounds like it was not drained to completion and post thoracentesis film demonstrates smaller but it appears to be loculated left-sided pleural effusion.  Drenching night sweats have resolved.  He lost quite a bit of weight while in the hospital but is now gaining weight.  Denies any lower extremity swelling.  Thought that he got a little bit better in terms with rate after thoracentesis.  He still has residual dyspnea.  PMH: Hypertension, asthma Surgery: Appendectomy Family history: Allergies, asthma Social history: Never smoker, retired history professor, from [place], [place] / Pulmonary Flowsheets:   ACT:  No flowsheet data found.  MMRC: No flowsheet data found.  Epworth:  No flowsheet data found.  Tests:   FENO:  No results found for: NITRICOXIDE  PFT: No flowsheet data found.  WALK:  No flowsheet data found.  Imaging: Personally reviewed and as per interpretation in this note. CT HEAD WO CONTRAST  Result Date: 10/21/2019 CLINICAL DATA:  Headache.  History of previous subdural hematoma EXAM: CT HEAD WITHOUT CONTRAST TECHNIQUE: Contiguous axial images were obtained from the base of the skull through the vertex without intravenous contrast. COMPARISON:  August 20, 2019 FINDINGS: Brain: There is age related volume loss. Previous left-sided subdural hematoma has resolved. Currently there is no demonstrable mass, hemorrhage, extra-axial fluid collection, or midline shift. There is mild small vessel disease in the centra semiovale bilaterally.  No acute appearing infarct evident. Vascular: No hyperdense vessels. There is calcification in each carotid siphon region. Skull: Bony calvarium appears intact. Sinuses/Orbits: There is mucosal thickening in several ethmoid air cells. Other paranasal sinuses are clear. Orbits appear symmetric bilaterally. Other: Mastoid air  cells are clear. There is debris in each external auditory canal. IMPRESSION: Interval resolution of left-sided subdural hematoma. Currently no hemorrhage or extra-axial fluid collection. There is age related volume loss with mild periventricular small vessel disease. No acute infarct evident. There are foci of arterial vascular calcification. There is mucosal thickening in several ethmoid air cells. There is probable cerumen in each external auditory canal. Electronically Signed   By: Lowella Grip III M.D.   On: 10/21/2019 12:12   DG Chest Portable 1 View  Result Date: 11/01/2019 CLINICAL DATA:  Increasing shortness of breath and cough. EXAM: PORTABLE CHEST 1 VIEW COMPARISON:  10/30/2019 and prior radiographs. FINDINGS: The cardiomediastinal silhouette is unchanged. A small LEFT pleural effusion and LEFT LOWER lung consolidation/atelectasis are again identified. The RIGHT lung is clear. There is no evidence of pneumothorax. No acute bony abnormalities are present. Remote LEFT rib fractures are again identified. IMPRESSION: Unchanged small LEFT pleural effusion and LEFT LOWER lung consolidation/atelectasis. Electronically Signed   By: Margarette Canada M.D.   On: 11/01/2019 10:36   DG Chest Port 1 View  Result Date: 10/30/2019 CLINICAL DATA:  Cough, sore throat, dyspnea EXAM: PORTABLE CHEST 1 VIEW COMPARISON:  10/05/2019 FINDINGS: Moderate left pleural effusion has developed. Retrocardiac opacification may relate to posteriorly layering pleural fluid, atelectasis, or focal infiltrate within this region. Lungs are otherwise clear. No pneumothorax. No pleural effusion on the right. Cardiac size within normal limits. Pulmonary vascularity is normal. IMPRESSION: Enlarging, now moderate left pleural effusion. Retrocardiac opacification now present representing atelectasis or infiltrate within this region. In Electronically Signed   By: Fidela Salisbury MD   On: 10/30/2019 01:03   ECHOCARDIOGRAM COMPLETE  Result  Date: 10/16/2019    ECHOCARDIOGRAM REPORT   Patient Name:   [name] Date of Exam: 10/16/2019 Medical Rec #:  [id number]          Height:       67.0 in Accession #:    [id number]         Weight:       185.4 lb Date of Birth:  [birth year]          BSA:          1.958 m Patient Age:    33 years           BP:           128/72 mmHg Patient Gender: M                  HR:           73 bpm. Exam Location:  [address] Procedure: 2D Echo, 3D Echo, Color Doppler and Cardiac Doppler Indications:    R06.00 Dyspnea  History:        Patient has no prior history of Echocardiogram examinations. CAD                 and Previous Myocardial Infarction, Signs/Symptoms:Dyspnea,                 Shortness of Breath and Chest Pain; Risk Factors:Hypertension,                 Dyslipidemia, Family History of Coronary Artery Disease and  Former Smoker. Chronic Kidney Disease, Left Pleural Effusion                 status post pleuracentesis.  Sonographer:    Deliah Boston RDCS Referring Phys: [place]  1. Left ventricular ejection fraction, by estimation, is 50 to 55%. The left ventricle has low normal function. The left ventricle demonstrates regional wall motion abnormalities (see scoring diagram/findings for description). Left ventricular diastolic  parameters are consistent with Grade I diastolic dysfunction (impaired relaxation). There is probable moderate hypokinesis of the left ventricular, basal inferolateral wall. Technically challenging study.  2. Right ventricular systolic function is normal. The right ventricular size is normal. There is normal pulmonary artery systolic pressure.  3. A small pericardial effusion is present. The pericardial effusion is circumferential. There is no evidence of cardiac tamponade.  4. The mitral valve is normal in structure. No evidence of mitral valve regurgitation.  5. The aortic valve is grossly normal. Aortic valve regurgitation is not visualized.   6. The inferior vena cava is normal in size with greater than 50% respiratory variability, suggesting right atrial pressure of 3 mmHg. FINDINGS  Left Ventricle: Left ventricular ejection fraction, by estimation, is 50 to 55%. The left ventricle has low normal function. The left ventricle demonstrates regional wall motion abnormalities. Moderate hypokinesis of the left ventricular, basal inferolateral wall. The left ventricular internal cavity size was normal in size. There is no left ventricular hypertrophy. Left ventricular diastolic parameters are consistent with Grade I diastolic dysfunction (impaired relaxation). Normal left ventricular filling pressure.  LV Wall Scoring: The basal inferolateral segment is hypokinetic. Technically challenging study. Right Ventricle: The right ventricular size is normal. No increase in right ventricular wall thickness. Right ventricular systolic function is normal. There is normal pulmonary artery systolic pressure. The tricuspid regurgitant velocity is 2.09 m/s, and  with an assumed right atrial pressure of 3 mmHg, the estimated right ventricular systolic pressure is 86.7 mmHg. Left Atrium: Left atrial size was normal in size. Right Atrium: Right atrial size was normal in size. Pericardium: A small pericardial effusion is present. The pericardial effusion is circumferential. There is no evidence of cardiac tamponade. Mitral Valve: The mitral valve is normal in structure. Mild mitral annular calcification. No evidence of mitral valve regurgitation. Tricuspid Valve: The tricuspid valve is normal in structure. Tricuspid valve regurgitation is trivial. Aortic Valve: The aortic valve is grossly normal. Aortic valve regurgitation is not visualized. Pulmonic Valve: The pulmonic valve was not well visualized. Pulmonic valve regurgitation is not visualized. Aorta: The aortic root and ascending aorta are structurally normal, with no evidence of dilitation. Venous: The inferior vena cava  is normal in size with greater than 50% respiratory variability, suggesting right atrial pressure of 3 mmHg. IAS/Shunts: No atrial level shunt detected by color flow Doppler.  LEFT VENTRICLE PLAX 2D LVIDd:         3.80 cm  Diastology LVIDs:         2.70 cm  LV e' medial:    5.00 cm/s LV PW:         0.90 cm  LV E/e' medial:  14.2 LV IVS:        0.90 cm  LV e' lateral:   6.53 cm/s LVOT diam:     2.60 cm  LV E/e' lateral: 10.9 LV SV:         109 LV SV Index:   56 LVOT Area:     5.31 cm  3D Volume EF:                         3D EF:        56 %                         LV EDV:       164 ml                         LV ESV:       72 ml                         LV SV:        92 ml RIGHT VENTRICLE RV S prime:     14.50 cm/s TAPSE (M-mode): 2.3 cm LEFT ATRIUM             Index       RIGHT ATRIUM           Index LA diam:        3.40 cm 1.74 cm/m  RA Area:     16.30 cm LA Vol (A2C):   68.7 ml 35.08 ml/m RA Volume:   41.20 ml  21.04 ml/m LA Vol (A4C):   40.4 ml 20.63 ml/m LA Biplane Vol: 56.2 ml 28.70 ml/m  AORTIC VALVE LVOT Vmax:   82.90 cm/s LVOT Vmean:  53.100 cm/s LVOT VTI:    0.205 m  AORTA Ao Root diam: 3.50 cm Ao Asc diam:  3.70 cm MITRAL VALVE               TRICUSPID VALVE MV Area (PHT): cm         TR Peak grad:   17.5 mmHg MV Decel Time: 313 msec    TR Vmax:        209.00 cm/s MV E velocity: 71.10 cm/s MV A velocity: 81.60 cm/s  SHUNTS MV E/A ratio:  0.87        Systemic VTI:  0.20 m                            Systemic Diam: 2.60 cm Dani Gobble Croitoru MD Electronically signed by Sanda Klein MD Signature Date/Time: 10/16/2019/5:27:29 PM    Final     Lab Results: Reviewed, and as per EMR CBC    Component Value Date/Time   WBC 4.9 11/01/2019 1250   RBC 3.99 (L) 11/01/2019 1250   HGB 11.7 (L) 11/01/2019 1250   HCT 37.1 (L) 11/01/2019 1250   PLT 183 11/01/2019 1250   MCV 93.0 11/01/2019 1250   MCV 100.0 (A) 09/28/2012 0846   MCH 29.3 11/01/2019 1250   MCHC 31.5 11/01/2019 1250    RDW 14.4 11/01/2019 1250   LYMPHSABS 0.9 11/01/2019 1250   MONOABS 0.6 11/01/2019 1250   EOSABS 0.4 11/01/2019 1250   BASOSABS 0.0 11/01/2019 1250    BMET    Component Value Date/Time   NA 136 11/01/2019 1250   K 3.9 11/01/2019 1250   CL 102 11/01/2019 1250   CO2 24 11/01/2019 1250   GLUCOSE 103 (H) 11/01/2019 1250   BUN 14 11/01/2019 1250   CREATININE 1.35 (H) 11/01/2019 1250   CREATININE 1.51 (H) 08/25/2019 1301   CALCIUM 8.7 (L) 11/01/2019 1250   GFRNONAA 48 (L) 11/01/2019 1250   GFRNONAA 42 (L)  08/25/2019 1301   GFRAA 55 (L) 11/01/2019 1250   GFRAA 48 (L) 08/25/2019 1301    BNP    Component Value Date/Time   BNP 105.9 (H) 11/01/2019 1250    ProBNP    Component Value Date/Time   PROBNP 45.0 07/29/2006 1440    Specialty Problems      Pulmonary Problems   Acute upper respiratory infections of unspecified site      Allergies  Allergen Reactions  . Gabapentin     Visual changes    Immunization History  Administered Date(s) Administered  . Influenza Split 10/06/2012  . Influenza, High Dose Seasonal PF 12/02/2016, 10/26/2018, 10/27/2018  . Influenza,inj,Quad PF,6+ Mos 11/12/2013, 11/11/2014, 10/20/2015, 11/12/2017  . Moderna SARS-COVID-2 Vaccination 03/04/2019, 04/03/2019  . Pneumococcal Conjugate-13 03/17/2013  . Pneumococcal Polysaccharide-23 02/05/2001  . Td 03/01/2009  . Zoster 02/25/2008    Past Medical History:  Diagnosis Date  . Allergy   . BPH (benign prostatic hyperplasia)   . BPH (benign prostatic hypertrophy)   . CAD (coronary artery disease)    s/p cypher DES to pLAD 6/08; normal LVF;  ETT-Myoview 2009: no ischemia   . Coronary artery disease   . Eosinophilia   . High cholesterol   . HTN (hypertension)   . Hyperlipidemia   . Hypertension   . MI (myocardial infarction) ([place])   . Myocardial infarction ([place])   . Prediabetes   . Trigeminal neuralgia     Tobacco History: Social History   Tobacco Use  Smoking Status Never Smoker   Smokeless Tobacco Never Used  Tobacco Comment   quit over 81yrs ago.   Counseling given: Not Answered Comment: quit over 72yrs ago.   Continue to not smoke  Outpatient Encounter Medications as of 11/04/2019  Medication Sig  . acetaminophen (TYLENOL) 500 MG tablet Take 1,000 mg by mouth every 6 (six) hours as needed for moderate pain.  Marland Kitchen albuterol (PROAIR HFA) 108 (90 Base) MCG/ACT inhaler INHALE 2 PUFFS INTO THE LUNGS EVERY 6 HOURS AS NEEDED FOR WHEEZING OR SHORTNESS OF BREATH (Patient taking differently: Inhale 2 puffs into the lungs every 6 (six) hours as needed for wheezing or shortness of breath. INHALE 2 PUFFS INTO THE LUNGS EVERY 6 HOURS AS NEEDED FOR WHEEZING OR SHORTNESS OF BREATH)  . amoxicillin-clavulanate (AUGMENTIN) 875-125 MG tablet Take 1 tablet by mouth 2 (two) times daily for 7 days.  Marland Kitchen Apoaequorin (PREVAGEN) 10 MG CAPS Take 10 mg by mouth daily.  Marland Kitchen atorvastatin (LIPITOR) 40 MG tablet Take 40 mg by mouth daily.  . Calcium-Vitamin D 600-200 MG-UNIT tablet Take 1 tablet by mouth 2 (two) times daily. (Patient taking differently: Take 1 tablet by mouth daily. )  . doxazosin (CARDURA) 2 MG tablet Take 1 tablet (2 mg total) by mouth daily.  Marland Kitchen escitalopram (LEXAPRO) 10 MG tablet TAKE 1 TABLET BY MOUTH EVERY DAY (Patient taking differently: Take 10 mg by mouth daily. )  . ezetimibe (ZETIA) 10 MG tablet Take 1 tablet (10 mg total) by mouth daily. Please make yearly appt with Dr. [name] for October for future refills. 1st attempt  . fluticasone furoate-vilanterol (BREO ELLIPTA) 200-25 MCG/INH AEPB TAKE 1 PUFF BY MOUTH EVERY DAY  . Multiple Vitamin (MULTIVITAMIN WITH MINERALS) TABS tablet Take 1 tablet by mouth daily.  Marland Kitchen ezetimibe-simvastatin (VYTORIN) 10-10 MG tablet Take 1 tablet by mouth at bedtime.  Marland Kitchen loratadine (CLARITIN) 10 MG tablet Take by mouth.   No facility-administered encounter medications on file as of 11/04/2019.  Review of Systems  Review of Systems  No chest  pain with exertion.  Does endorse low back pain that was present, months ago but seems to not be as bothersome now.  No fever or chills.  Is gaining weight after losing so much weight in the hospital after his fall couple months ago.  Had drenching night sweats as above but no longer having them.  Comments review of systems otherwise negative. Physical Exam  BP 132/76 (BP Location: Left Arm, Cuff Size: Normal)   Pulse 83   Temp 98.4 F (36.9 C) (Oral)   Ht 5' 7.5" (1.715 m)   Wt 183 lb (83 kg)   SpO2 94%   BMI 28.24 kg/m   Wt Readings from Last 5 Encounters:  11/04/19 183 lb (83 kg)  11/01/19 173 lb (78.5 kg)  09/30/19 185 lb 6.4 oz (84.1 kg)  09/23/19 186 lb (84.4 kg)  09/04/19 188 lb (85.3 kg)    BMI Readings from Last 5 Encounters:  11/04/19 28.24 kg/m  11/01/19 26.70 kg/m  09/30/19 29.04 kg/m  09/23/19 29.13 kg/m  09/04/19 29.44 kg/m     Physical Exam General: Well-appearing, no acute distress Eyes: EOMI, no icterus Neck: Supple, no JVP appreciated Cardiovascular: Regular rhythm, no murmurs Pulmonary: No breath sounds left lower to mid lung field, otherwise clear to auscultation bilaterally, Abdomen: Soft, bowel sounds present Extremities: No swelling, warm MSK: No joint fusion, no synovitis Neuro: Normal gait, no weakness Psych: Normal mood, full affect   Assessment & Plan:   Recurrent left pleural effusion: Status post thoracentesis late August 2021.  States consistent with exudate via protein.  Lymphocytic predominance.  Unclear etiology.  Burtis Junes most likely related to postinflammatory changes after fall on left side versus what is more likely parapneumonic effusion after febrile illness treated with antibiotics 08/2019.  Serial chest x-rays have showed that the fluid has reaccumulated although not to the level as it was prior but certainly appears larger than the post thoracentesis chest x-ray.  Plan to repeat thoracentesis in the office next week, tentatively  11/11/2019.  Will resend studies.  If continues to recur, have to consider other etiologies of recurrent pleural effusion.  Dyspnea on exertion: Likely related to pleural effusion.  Unclear how much better he got after draining.  After the 1.6 L out would expect some significant improvement.  Lanier Clam, MD 11/04/2019

## 2019-11-05 ENCOUNTER — Telehealth: Payer: Self-pay | Admitting: Pulmonary Disease

## 2019-11-05 DIAGNOSIS — I6203 Nontraumatic chronic subdural hemorrhage: Secondary | ICD-10-CM | POA: Diagnosis not present

## 2019-11-05 DIAGNOSIS — Z6829 Body mass index (BMI) 29.0-29.9, adult: Secondary | ICD-10-CM | POA: Diagnosis not present

## 2019-11-05 DIAGNOSIS — I10 Essential (primary) hypertension: Secondary | ICD-10-CM | POA: Diagnosis not present

## 2019-11-05 NOTE — Telephone Encounter (Signed)
lmtcb

## 2019-11-06 NOTE — Telephone Encounter (Signed)
LMTCB

## 2019-11-06 NOTE — Telephone Encounter (Signed)
Spoke with patients wife who states that her husband has an appointment with our office on 10/6 and she has been with her mother today 10/1 who has been in the hospital for the last 17 days with Covid Pneumonia. They took her off the vent for her to pass and she was in the room during that process. States she had on full PPE the entire time. She is wondering is she needs to quarantine until his appointment so she can come with him. Checked with Ander Purpura and Jackson Latino in regards to her coming in with husband for his visit. It was agreed that she needs to monitor her symptoms from now until his appointment and if she develops any for her to either go get tested or to not come with him to appointment. Told her that since she had on full PPE and it was never removed she should be fine. Patient expressed understanding. Nothing further needed at this time.

## 2019-11-09 ENCOUNTER — Ambulatory Visit: Payer: PPO

## 2019-11-09 ENCOUNTER — Ambulatory Visit: Payer: PPO | Admitting: Physical Therapy

## 2019-11-09 ENCOUNTER — Ambulatory Visit: Payer: PPO | Admitting: Occupational Therapy

## 2019-11-10 ENCOUNTER — Encounter: Payer: PPO | Admitting: Speech Pathology

## 2019-11-10 ENCOUNTER — Telehealth: Payer: Self-pay | Admitting: Pulmonary Disease

## 2019-11-10 NOTE — Telephone Encounter (Signed)
Called and spoke with pt's wife who stated she started having covid symptoms 3 days ago including sore throat, ear ache, diarrhea, chest pain and congestion, and also has a cough which she also states she gets due to her allergies. She states that she has not had any fever that she is aware of.  Pt's wife states that pt was already being treated for a cold and finished abx about 3 days ago. Pt still has a hacky cough but states the cough is better than it was at [facility]. Pt's wife stated that pt had O2 sat of 83% briefly but it did go back up quickly to 94%.  Pt's wife stated that she had been at the hospital with her mom as she had been in the ICU on a vent after having covid pneumonia. Her mom was taken off of the vent and she was at the hospital with her until she passed on 05/28/22.  Pt is scheduled to have a thoracentesis in office tomorrow 10/6 by Dr. Silas Flood but due to the recent exposure, they need to know what needs to be done if pt's appt needs to be rescheduled.  [name] is showing that Dr. Silas Flood is off today so sending this to APP of the day as well as Dr. Silas Flood in case Dr. Silas Flood is not able to have Epic access today.  Aaron Edelman and Dr. Silas Flood, please advise.

## 2019-11-10 NOTE — Telephone Encounter (Signed)
11/10/2019  Patient with recent negative Covid test on file from 10/30/2019.  Patient is vaccinated.  Patient spouse will need to obtain outpatient Covid testing.    If patient spouse is positive then patient will need to be tested again.  Based off of patient's spouse Covid testing results a thoracentesis may be rescheduled.  Wyn Quaker, FNP

## 2019-11-10 NOTE — Telephone Encounter (Signed)
LMTCB x1 for pt.  

## 2019-11-10 NOTE — Telephone Encounter (Signed)
pt's wife called - pt has a thora scheduled for tomorrow with Dr. Silas Flood - wife states that she has developed COVID symptoms - her mom was in ICU with COVID pneumonia until she passed on 2022/05/25- pt's wife now has symptoms of sore throat, earache, chest pain and congestion and pt has symptoms of cough (finished his antibiotics from Dr. Silas Flood) and SOB - needs to know if pt can still come to his appt tomorrow for his thora. - please advise [phone]

## 2019-11-11 ENCOUNTER — Ambulatory Visit: Payer: PPO | Admitting: Pulmonary Disease

## 2019-11-11 NOTE — Telephone Encounter (Signed)
Called and spoke to pt and pt's spouse, [name]. [name] states she had a rapid test on 10/5 that resulted negative, she also got a PCR test and those results wont come back until 10/7.   [name] and pt are questioning if pt can still come in today for thoracentesis, scheduled for 1145.   Dr. Silas Flood, please advise. Thanks.

## 2019-11-11 NOTE — Telephone Encounter (Signed)
Re-schedule 10/15 1130 am thanks

## 2019-11-11 NOTE — Telephone Encounter (Signed)
Called spoke with patient and wife. They are going to reschedule due to waiting results to a covid test. Wife was around mother who was covid positive and just recently started symptoms.  Patient verbalized understanding nothing further needed

## 2019-11-11 NOTE — Telephone Encounter (Signed)
Patient's wife calling back regarding keeping pt's appt for today.  Appt at 11:45.   [phone]

## 2019-11-12 ENCOUNTER — Encounter: Payer: PPO | Admitting: Speech Pathology

## 2019-11-12 ENCOUNTER — Ambulatory Visit: Payer: PPO | Attending: Physical Medicine and Rehabilitation | Admitting: Speech Pathology

## 2019-11-12 ENCOUNTER — Ambulatory Visit: Payer: PPO | Admitting: Occupational Therapy

## 2019-11-12 ENCOUNTER — Other Ambulatory Visit: Payer: Self-pay

## 2019-11-12 ENCOUNTER — Encounter: Payer: Self-pay | Admitting: Occupational Therapy

## 2019-11-12 DIAGNOSIS — S069X0D Unspecified intracranial injury without loss of consciousness, subsequent encounter: Secondary | ICD-10-CM | POA: Insufficient documentation

## 2019-11-12 DIAGNOSIS — R41841 Cognitive communication deficit: Secondary | ICD-10-CM | POA: Diagnosis not present

## 2019-11-12 DIAGNOSIS — M6281 Muscle weakness (generalized): Secondary | ICD-10-CM | POA: Insufficient documentation

## 2019-11-12 DIAGNOSIS — M79642 Pain in left hand: Secondary | ICD-10-CM | POA: Diagnosis not present

## 2019-11-12 DIAGNOSIS — M25642 Stiffness of left hand, not elsewhere classified: Secondary | ICD-10-CM | POA: Insufficient documentation

## 2019-11-12 DIAGNOSIS — R2681 Unsteadiness on feet: Secondary | ICD-10-CM

## 2019-11-12 DIAGNOSIS — M25632 Stiffness of left wrist, not elsewhere classified: Secondary | ICD-10-CM

## 2019-11-12 DIAGNOSIS — R2689 Other abnormalities of gait and mobility: Secondary | ICD-10-CM | POA: Diagnosis not present

## 2019-11-12 DIAGNOSIS — R29818 Other symptoms and signs involving the nervous system: Secondary | ICD-10-CM | POA: Insufficient documentation

## 2019-11-12 NOTE — Therapy (Signed)
[facility] [address] [place], [place], [address] Phone: [phone]   Fax:  [phone]  Speech Language Pathology Treatment  Patient Details  Name: [name] MRN: [id number] Date of Birth: Jan 14, 1934 Referring Provider (SLP): Reesa Chew, PA-C   Encounter Date: 11/12/2019   End of Session - 11/12/19 1833    Visit Number 15    Number of Visits 17    Date for SLP Re-Evaluation 11/16/19    SLP Start Time 1703    SLP Stop Time  1747    SLP Time Calculation (min) 44 min    Activity Tolerance Patient tolerated treatment well           Past Medical History:  Diagnosis Date  . Allergy   . BPH (benign prostatic hyperplasia)   . BPH (benign prostatic hypertrophy)   . CAD (coronary artery disease)    s/p cypher DES to pLAD 6/08; normal LVF;  ETT-Myoview 2009: no ischemia   . Coronary artery disease   . Eosinophilia   . High cholesterol   . HTN (hypertension)   . Hyperlipidemia   . Hypertension   . MI (myocardial infarction) ([place])   . Myocardial infarction ([place])   . Prediabetes   . Trigeminal neuralgia     Past Surgical History:  Procedure Laterality Date  . APPENDECTOMY    . CARDIAC CATHETERIZATION  07/30/2006   CORONARY ANGIOPLASTY WITH STENT PLACEMENT  . CARDIAC CATHETERIZATION    . EXPLORATORY LAPAROTOMY     age 7  . EXPLORATORY LAPAROTOMY    . IR THORACENTESIS ASP PLEURAL SPACE W/IMG GUIDE  10/05/2019    There were no vitals filed for this visit.   Subjective Assessment - 11/12/19 1705    Subjective "I don't know if all this is yours."    Currently in Pain? No/denies                 ADULT SLP TREATMENT - 11/12/19 1706      General Information   Behavior/Cognition Alert;Cooperative;Pleasant mood;Impulsive;Requires cueing      Treatment Provided   Treatment provided Cognitive-Linquistic      Pain Assessment   Pain Assessment No/denies pain      Cognitive-Linquistic  Treatment   Treatment focused on Cognition;Patient/family/caregiver education    Skilled Treatment Patient handed SLP a pile of homework papers, most of which had been completed/reviewed in prior sessions. Most recent handout given 9/16 (last appointment) had 4/10 questions blank, pt unaware. When correcting answers, pt required usual mod cues for alternating attention, awareness, using strategy to check off items after reviewed. Patient told SLP he still hopes to drive; as SLP telling pt SLP concerns about pt driving (attention, reaction time, awareness), patient pointed out the window and stated, "That's my wife." Wife joined session. We discussed remaining LTGs and plan to d/c after 2 visits next week. Discussed recovery process and that we would share additional resources and recommendations to continue working on cognition at home after completing therapy. Also advised that MD clearance necessary for pt to drive and that SLP would absolutely advise having OT driving eval due to attention concerns.      Assessment / Recommendations / Plan   Plan Continue with current plan of care      Progression Toward Goals   Progression toward goals --   error awareness, attention unchanged over last few sessions           SLP Education - 11/12/19 1832  Education Details recommend OT driving eval; SLP has concerns about how pt attention may affect ability to drive safely    Person(s) Educated Patient;Spouse    Methods Explanation    Comprehension Verbalized understanding            SLP Short Term Goals - 11/12/19 1716      SLP SHORT TERM GOAL #1   Title pt will demo knowledge of his memory copmensation system by attempt to use with rare min cues in 2 sessions    Baseline 09-16-19    Status Partially Met      SLP SHORT TERM GOAL #2   Title pt will demo correct/successful orientation to a memory compensation system in 3 sessions    Status Not Met      SLP SHORT TERM GOAL #3   Title pt will  tell SLP 2 cognitive deficits independently, and how they may impact him at home with rare min A in 3 sessions    Status Not Met      SLP SHORT TERM GOAL #4   Title pt will undergo further cognitive linguisitic testing PRN    Period --   or 7 total sessions   Status Achieved      SLP SHORT TERM GOAL #5   Title Patient will use compensations for attention during functional task (eg conversation or reading on topic of interest) with min cues x 2 sessions.    Status Not Met            SLP Long Term Goals - 11/12/19 1716      SLP LONG TERM GOAL #1   Title pt will demo correct usage of a memory compensation system for appointment and/or medication management, to-do lists, daily schedules in 3 sessions    Time 1    Period Weeks   or 17 total sessions, for all LTGs   Status On-going      SLP LONG TERM GOAL #2   Title pt will demo emergent awareness by ID'ing 75% of errors in cognitive linguistic tasks with double-checking x 2 sessions.    Time 1    Period Weeks    Status Revised      SLP LONG TERM GOAL #3   Title Pt will maintain selective attention in functional task for 8 minutes in mod noisy environment with rare min cues x 2 sessions.    Time 1    Period Weeks    Status On-going            Plan - 11/12/19 1834    Clinical Impression Statement Pt presents today with moderate-severe cognitive communication deficits; most significantly in attention and memory. Impulsivity, decreased awareness noted; these remain commensurate with previous sessions. Pt cont to require usual mod-max cues for where to look in memory book. Anticipate d/c in next 1-2 sessions; discussed with pt and wife today. Recommended if pt wishes to drive that he should have an OT driving evaluation due to cognitive concerns. Continue skilled ST to increase pt independence with schedule and daily tasks.    Speech Therapy Frequency 2x / week    Duration --   8 weeks or 17 total sessions   Treatment/Interventions  Language facilitation;Cueing hierarchy;Cognitive reorganization;Internal/external aids;Patient/family education;Compensatory strategies;SLP instruction and feedback;Functional tasks;Environmental controls    Potential to Achieve Goals Good    Potential Considerations Severity of impairments;Ability to learn/carryover information    Consulted and Agree with Plan of Care Patient  Patient will benefit from skilled therapeutic intervention in order to improve the following deficits and impairments:   Cognitive communication deficit    Problem List Patient Active Problem List   Diagnosis Date Noted  . Fracture of multiple ribs with pain 08/10/2019  . Clavicle fracture 08/10/2019  . Closed displaced fracture of phalanx of left thumb, sequela 08/10/2019  . TBI (traumatic brain injury) ([place]) 07/23/2019  . Traumatic closed fracture of distal clavicle with minimal displacement, left, initial encounter 07/19/2019  . Traumatic brain injury with loss of consciousness ([place])   . Multiple trauma   . Benign prostatic hyperplasia   . Essential hypertension   . AKI (acute kidney injury) ([place])   . Stage 3b chronic kidney disease ([place])   . Prediabetes   . Acute blood loss anemia   . Thrombocytopenia ([place])   . Fall 07/14/2019  . Type 2 diabetes mellitus ([place])   . Eosinophilia   . Need for prophylactic vaccination and inoculation against influenza 11/12/2013  . Nocturnal leg cramps 11/23/2012  . Pain in joint, shoulder region 11/23/2012  . Fall 11/04/2012  . Hematoma 11/04/2012  . Acute upper respiratory infections of unspecified site 11/04/2012  . HYPERLIPIDEMIA TYPE IIB / III 03/08/2008  . Orthostatic hypotension 03/08/2008  . CAD, NATIVE VESSEL 03/08/2008   Deneise Lever, [place], [place] Speech-Language Pathologist  Aliene Altes 11/12/2019, 6:36 PM  [facility] [address] [place], [place], [address] Phone:  [phone]   Fax:  [phone]   Name: [name] MRN: [id number] Date of Birth: 1933-08-24

## 2019-11-12 NOTE — Therapy (Signed)
[facility] [address] [place], [place], [address] Phone: [phone]   Fax:  [phone]  Occupational Therapy Treatment / Discharge Summary  Patient Details  Name: [name] MRN: [id number] Date of Birth: 1933/07/19 Referring Provider (OT): Reesa Chew   Encounter Date: 11/12/2019   OT End of Session - 11/12/19 1804    Visit Number 16    Number of Visits 17    Date for OT Re-Evaluation 12/27/19    Authorization Type [employer] - Follow Medicare guidelines    Authorization - Visit Number 16    Authorization - Number of Visits 20    Progress Note Due on Visit 20    OT Start Time 1618    OT Stop Time 1700    OT Time Calculation (min) 42 min    Activity Tolerance Patient tolerated treatment well    Behavior During Therapy [facility] for tasks assessed/performed;Impulsive           Past Medical History:  Diagnosis Date  . Allergy   . BPH (benign prostatic hyperplasia)   . BPH (benign prostatic hypertrophy)   . CAD (coronary artery disease)    s/p cypher DES to pLAD 6/08; normal LVF;  ETT-Myoview 2009: no ischemia   . Coronary artery disease   . Eosinophilia   . High cholesterol   . HTN (hypertension)   . Hyperlipidemia   . Hypertension   . MI (myocardial infarction) ([place])   . Myocardial infarction ([place])   . Prediabetes   . Trigeminal neuralgia     Past Surgical History:  Procedure Laterality Date  . APPENDECTOMY    . CARDIAC CATHETERIZATION  07/30/2006   CORONARY ANGIOPLASTY WITH STENT PLACEMENT  . CARDIAC CATHETERIZATION    . EXPLORATORY LAPAROTOMY     age 86  . EXPLORATORY LAPAROTOMY    . IR THORACENTESIS ASP PLEURAL SPACE W/IMG GUIDE  10/05/2019    There were no vitals filed for this visit.   Subjective Assessment - 11/12/19 1625    Subjective  My shoulder (right) was hurting last night    Currently in Pain? Yes    Pain Location Shoulder    Pain Orientation Right    Pain Descriptors  / Indicators Aching    Pain Type Acute pain                        OT Treatments/Exercises (OP) - 11/12/19 0001      ADLs   ADL Comments Patient has met OT goals and is ready for discharge      Neurological Re-education Exercises   Other Exercises 1 AAROM Left glenohmeral joint following heat to address pain with increased range.  Patient actively guarding intially, but able to realx to allow assisted motion.  Followed with active range of motion bilaterally to address shoulder range to 120 degrees flexion.        Moist Heat Therapy   Number Minutes Moist Heat 10 Minutes    Moist Heat Location Shoulder                    OT Short Term Goals - 11/12/19 1806      OT SHORT TERM GOAL #1   Title Patient will complete an HEP designed to improve left thumb range of motion with min cueing due 09/27/19    Time 4    Period Weeks    Status Achieved      OT  SHORT TERM GOAL #2   Title Patient will complete a home exercise program designed to improve coordination in left hand with set up assistance    Period Weeks    Status Achieved      OT SHORT TERM GOAL #3   Title Patient will don/doff a pull over shirt with set up assistance    Time 4    Period Weeks    Status Achieved      OT SHORT TERM GOAL #4   Title Patient will demonstrate low reach to obtain a lightweight (less than 3 lb) object with left hand    Time 4    Period Weeks    Status Achieved             OT Long Term Goals - 11/12/19 1658      OT LONG TERM GOAL #1   Title Patient will complete HEP/Home activities program to improve functional mobility and use of left hand    Period Weeks    Status Achieved      OT LONG TERM GOAL #2   Title Patient will bathe and dress himself with modified independence - with increased time    Time 8    Period Weeks    Status Achieved      OT LONG TERM GOAL #3   Title Patient will prepare a familiar hot meal using left hand as non dominant with supervision     Time 8    Period Weeks    Status Achieved      OT LONG TERM GOAL #4   Title Patient will demonstrate ability to manipulate small items within left hand as evidenced by his ability to complete 9 hole peg test in less than 1 minute    Time 8    Period Weeks    Status Achieved      OT LONG TERM GOAL #5   Title Patient will understand recommendations related to driving    Time 8    Period Weeks    Status Achieved                 Plan - 11/12/19 1805    Clinical Impression Statement Patient has met all OT goals and is agreeable to OT discharge one visit earlier than intially planned.    OT Occupational Profile and History Detailed Assessment- Review of Records and additional review of physical, cognitive, psychosocial history related to current functional performance    Occupational performance deficits (Please refer to evaluation for details): ADL's;IADL's;Leisure;Rest and Sleep    Body Structure / Function / Physical Skills ADL;Coordination;Endurance;GMC;UE functional use;Sensation;Decreased knowledge of precautions;Balance;Body mechanics;Decreased knowledge of use of DME;Flexibility;IADL;Pain;Strength;FMC;Dexterity;Cardiopulmonary status limiting activity;ROM;Mobility;Tone    Cognitive Skills Attention;Thought;Problem Solve;Safety Awareness;Memory;Energy/Drive    Rehab Potential Excellent    Clinical Decision Making Several treatment options, min-mod task modification necessary    Comorbidities Affecting Occupational Performance: May have comorbidities impacting occupational performance    Modification or Assistance to Complete Evaluation  Min-Moderate modification of tasks or assist with assess necessary to complete eval    OT Frequency 2x / week    OT Duration 8 weeks    OT Treatment/Interventions Self-care/ADL training;Electrical Stimulation;Iontophoresis;Therapeutic exercise;Moist Heat;Paraffin;Neuromuscular education;Splinting;Patient/family education;Balance  training;Therapeutic activities;Functional Mobility Training;Fluidtherapy;Cryotherapy;Ultrasound;DME and/or AE instruction;Manual Therapy;Passive range of motion;Cognitive remediation/compensation    Plan discharge    OT Home Exercise Plan Has HEP from CIR for shoulder and thumb (L)    Consulted and Agree with Plan of Care Patient  Patient will benefit from skilled therapeutic intervention in order to improve the following deficits and impairments:   Body Structure / Function / Physical Skills: ADL, Coordination, Endurance, GMC, UE functional use, Sensation, Decreased knowledge of precautions, Balance, Body mechanics, Decreased knowledge of use of DME, Flexibility, IADL, Pain, Strength, FMC, Dexterity, Cardiopulmonary status limiting activity, ROM, Mobility, Tone Cognitive Skills: Attention, Thought, Problem Solve, Safety Awareness, Memory, Energy/Drive     Visit Diagnosis: Muscle weakness (generalized)  Unsteadiness on feet  Pain in left hand  Stiffness of left hand, not elsewhere classified  Other symptoms and signs involving the nervous system  Stiffness of left wrist, not elsewhere classified    Problem List Patient Active Problem List   Diagnosis Date Noted  . Fracture of multiple ribs with pain 08/10/2019  . Clavicle fracture 08/10/2019  . Closed displaced fracture of phalanx of left thumb, sequela 08/10/2019  . TBI (traumatic brain injury) ([place]) 07/23/2019  . Traumatic closed fracture of distal clavicle with minimal displacement, left, initial encounter 07/19/2019  . Traumatic brain injury with loss of consciousness ([place])   . Multiple trauma   . Benign prostatic hyperplasia   . Essential hypertension   . AKI (acute kidney injury) ([place])   . Stage 3b chronic kidney disease ([place])   . Prediabetes   . Acute blood loss anemia   . Thrombocytopenia ([place])   . Fall 07/14/2019  . Type 2 diabetes mellitus ([place])   . Eosinophilia   . Need for prophylactic  vaccination and inoculation against influenza 11/12/2013  . Nocturnal leg cramps 11/23/2012  . Pain in joint, shoulder region 11/23/2012  . Fall 11/04/2012  . Hematoma 11/04/2012  . Acute upper respiratory infections of unspecified site 11/04/2012  . HYPERLIPIDEMIA TYPE IIB / III 03/08/2008  . Orthostatic hypotension 03/08/2008  . CAD, NATIVE VESSEL 03/08/2008  OCCUPATIONAL THERAPY DISCHARGE SUMMARY  Visits from Start of Care: 16  Current functional level related to goals / functional outcomes: Improved functional use of left hand, improved independence with ADL/IADL   Remaining deficits: Decreased memory   Education / Equipment: HEP BUE  Plan: Patient agrees to discharge.  Patient goals were met. Patient is being discharged due to meeting the stated rehab goals.  ?????      Mariah Milling , OTR/L 11/12/2019, 6:07 PM  [facility] [address] [place], [place], [address] Phone: [phone]   Fax:  [phone]  Name: [name] MRN: [id number] Date of Birth: October 07, 1933

## 2019-11-13 ENCOUNTER — Ambulatory Visit (HOSPITAL_COMMUNITY)
Admission: RE | Admit: 2019-11-13 | Payer: PPO | Source: Ambulatory Visit | Attending: Cardiovascular Disease | Admitting: Cardiovascular Disease

## 2019-11-17 ENCOUNTER — Other Ambulatory Visit: Payer: Self-pay

## 2019-11-17 ENCOUNTER — Ambulatory Visit: Payer: PPO

## 2019-11-17 ENCOUNTER — Ambulatory Visit: Payer: PPO | Admitting: Speech Pathology

## 2019-11-17 ENCOUNTER — Telehealth: Payer: Self-pay

## 2019-11-17 ENCOUNTER — Ambulatory Visit: Payer: PPO | Admitting: Occupational Therapy

## 2019-11-17 VITALS — BP 143/78 | HR 88

## 2019-11-17 DIAGNOSIS — R41841 Cognitive communication deficit: Secondary | ICD-10-CM

## 2019-11-17 DIAGNOSIS — R2681 Unsteadiness on feet: Secondary | ICD-10-CM

## 2019-11-17 DIAGNOSIS — M6281 Muscle weakness (generalized): Secondary | ICD-10-CM

## 2019-11-17 DIAGNOSIS — R2689 Other abnormalities of gait and mobility: Secondary | ICD-10-CM

## 2019-11-17 NOTE — Therapy (Signed)
[facility] [address] [place], [place], [address] Phone: [phone]   Fax:  [phone]  Speech Language Pathology Treatment  Patient Details  Name: [name] MRN: [id number] Date of Birth: July 28, 1933 Referring Provider (SLP): Reesa Chew, PA-C   Encounter Date: 11/17/2019   End of Session - 11/17/19 1705    Visit Number 16    Number of Visits 17    Date for SLP Re-Evaluation 11/21/19   date extended as pt missed several sessions due to illness   SLP Start Time 1448    SLP Stop Time  1530    SLP Time Calculation (min) 42 min    Activity Tolerance Patient tolerated treatment well           Past Medical History:  Diagnosis Date  . Allergy   . BPH (benign prostatic hyperplasia)   . BPH (benign prostatic hypertrophy)   . CAD (coronary artery disease)    s/p cypher DES to pLAD 6/08; normal LVF;  ETT-Myoview 2009: no ischemia   . Coronary artery disease   . Eosinophilia   . High cholesterol   . HTN (hypertension)   . Hyperlipidemia   . Hypertension   . MI (myocardial infarction) ([place])   . Myocardial infarction ([place])   . Prediabetes   . Trigeminal neuralgia     Past Surgical History:  Procedure Laterality Date  . APPENDECTOMY    . CARDIAC CATHETERIZATION  07/30/2006   CORONARY ANGIOPLASTY WITH STENT PLACEMENT  . CARDIAC CATHETERIZATION    . EXPLORATORY LAPAROTOMY     age 46  . EXPLORATORY LAPAROTOMY    . IR THORACENTESIS ASP PLEURAL SPACE W/IMG GUIDE  10/05/2019    There were no vitals filed for this visit.   Subjective Assessment - 11/17/19 1700    Subjective "I'm having some pain today."    Patient is accompained by: Family member   wife   Pain Score 6     Pain Location Neck    Pain Orientation Left    Pain Descriptors / Indicators Aching                 ADULT SLP TREATMENT - 11/17/19 1701      General Information   Behavior/Cognition Alert;Cooperative;Pleasant  mood;Impulsive;Requires cueing      Treatment Provided   Treatment provided Cognitive-Linquistic      Pain Assessment   Pain Assessment No/denies pain      Cognitive-Linquistic Treatment   Treatment focused on Cognition;Patient/family/caregiver education    Skilled Treatment Homework was completed, but pt had 2 blank sheets from previous assignment (not dated) that he was not sure if he was to complete. Required usual mod cues to use schedule to answer questions about upcoming appointments. Worked with pt to ID high-interest/functional tasks to work on cognition at home in preparation for [place] d/c next visit. Pt generated suggestion to track stats for soccer games, and SLP encouraged pt in doing this. Selective/attention to detail when reviewing soccer schedules for games of interest. Pt wrote notes about these on a piece of paper but notes were disorganized, and he could not answer questions using his notes. With mod cues pt organized into chart form, with usual mod cues required for error awareness. Pt to add other games of interest to his chart and schedule them on his calendar for [facility].      Assessment / Recommendations / Plan   Plan Continue with current plan of care  Progression Toward Goals   Progression toward goals Not progressing toward goals (comment)   cuing commensurate with previous sessions             SLP Short Term Goals - 11/12/19 1716      SLP SHORT TERM GOAL #1   Title pt will demo knowledge of his memory copmensation system by attempt to use with rare min cues in 2 sessions    Baseline 09-16-19    Status Partially Met      SLP SHORT TERM GOAL #2   Title pt will demo correct/successful orientation to a memory compensation system in 3 sessions    Status Not Met      SLP SHORT TERM GOAL #3   Title pt will tell SLP 2 cognitive deficits independently, and how they may impact him at home with rare min A in 3 sessions    Status Not Met      SLP SHORT TERM GOAL #4     Title pt will undergo further cognitive linguisitic testing PRN    Period --   or 7 total sessions   Status Achieved      SLP SHORT TERM GOAL #5   Title Patient will use compensations for attention during functional task (eg conversation or reading on topic of interest) with min cues x 2 sessions.    Status Not Met            SLP Long Term Goals - 11/17/19 1707      SLP LONG TERM GOAL #1   Title pt will demo correct usage of a memory compensation system for appointment and/or medication management, to-do lists, daily schedules in 3 sessions    Time 1    Period Weeks   or 17 total sessions, for all LTGs   Status On-going      SLP LONG TERM GOAL #2   Title pt will demo emergent awareness by ID'ing 75% of errors in cognitive linguistic tasks with double-checking x 2 sessions.    Time 1    Period Weeks    Status Revised      SLP LONG TERM GOAL #3   Title Pt will maintain selective attention in functional task for 8 minutes in mod noisy environment with rare min cues x 2 sessions.    Time 1    Period Weeks    Status On-going            Plan - 11/17/19 1707    Clinical Impression Statement Pt presents today with moderate-severe cognitive communication deficits; most significantly in attention and memory. Impulsivity, decreased awareness noted; these remain commensurate with previous sessions. Pt cont to require usual mod-max cues for where to look in memory book. Anticipate d/c in next 1-2 sessions; discussed with pt and wife today. Recommended if pt wishes to drive that he should have an OT driving evaluation due to cognitive concerns. Continue skilled ST to increase pt independence with schedule and daily tasks.    Speech Therapy Frequency 2x / week    Duration --   8 weeks or 17 total sessions   Treatment/Interventions Language facilitation;Cueing hierarchy;Cognitive reorganization;Internal/external aids;Patient/family education;Compensatory strategies;SLP instruction and  feedback;Functional tasks;Environmental controls    Potential to Achieve Goals Good    Potential Considerations Severity of impairments;Ability to learn/carryover information    Consulted and Agree with Plan of Care Patient           Patient will benefit from skilled therapeutic intervention in order to improve  the following deficits and impairments:   Cognitive communication deficit    Problem List Patient Active Problem List   Diagnosis Date Noted  . Fracture of multiple ribs with pain 08/10/2019  . Clavicle fracture 08/10/2019  . Closed displaced fracture of phalanx of left thumb, sequela 08/10/2019  . TBI (traumatic brain injury) ([place]) 07/23/2019  . Traumatic closed fracture of distal clavicle with minimal displacement, left, initial encounter 07/19/2019  . Traumatic brain injury with loss of consciousness ([place])   . Multiple trauma   . Benign prostatic hyperplasia   . Essential hypertension   . AKI (acute kidney injury) ([place])   . Stage 3b chronic kidney disease ([place])   . Prediabetes   . Acute blood loss anemia   . Thrombocytopenia ([place])   . Fall 07/14/2019  . Type 2 diabetes mellitus ([place])   . Eosinophilia   . Need for prophylactic vaccination and inoculation against influenza 11/12/2013  . Nocturnal leg cramps 11/23/2012  . Pain in joint, shoulder region 11/23/2012  . Fall 11/04/2012  . Hematoma 11/04/2012  . Acute upper respiratory infections of unspecified site 11/04/2012  . HYPERLIPIDEMIA TYPE IIB / III 03/08/2008  . Orthostatic hypotension 03/08/2008  . CAD, NATIVE VESSEL 03/08/2008   Deneise Lever, [place], [place] Speech-Language Pathologist  Aliene Altes 11/17/2019, 5:07 PM  [facility] [address] [place], [place], [address] Phone: [phone]   Fax:  [phone]   Name: TAMIR WALLMAN MRN: [id number] Date of Birth: 31-Aug-1933
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
## 2019-11-17 NOTE — Therapy (Signed)
[facility] [address] [place], [place], [address] Phone: [phone]   Fax:  [phone]  Physical Therapy Treatment/Discharge Summary  Patient Details  Name: [name] MRN: [id number] Date of Birth: 05-13-1933 Referring Provider (PT): Love, Olin Hauser PA-C  PHYSICAL THERAPY DISCHARGE SUMMARY  Visits from Start of Care: 16  Current functional level related to goals / functional outcomes: See Clinical Impression Statement for Details    Remaining deficits: Low Fall Risk   Education / Equipment: Educated on HEP/Walking Program  Plan: Patient agrees to discharge.  Patient goals were met. Patient is being discharged due to meeting the stated rehab goals.  ?????         Encounter Date: 11/17/2019   PT End of Session - 11/17/19 1546    Visit Number 16    Number of Visits 17    Date for PT Re-Evaluation 12/13/19   Updated POC for 4 weeks, Cert for 60 days   Authorization Type [employer] P[employer] - $15 co pay, collect 1 co pay per day for multiple disciplines    PT Start Time 1532    PT Stop Time 1614    PT Time Calculation (min) 42 min    Equipment Utilized During Treatment Gait belt    Activity Tolerance Patient tolerated treatment well    Behavior During Therapy WFL for tasks assessed/performed;Impulsive           Past Medical History:  Diagnosis Date  . Allergy   . BPH (benign prostatic hyperplasia)   . BPH (benign prostatic hypertrophy)   . CAD (coronary artery disease)    s/p cypher DES to pLAD 6/08; normal LVF;  ETT-Myoview 2009: no ischemia   . Coronary artery disease   . Eosinophilia   . High cholesterol   . HTN (hypertension)   . Hyperlipidemia   . Hypertension   . MI (myocardial infarction) ([place])   . Myocardial infarction ([place])   . Prediabetes   . Trigeminal neuralgia     Past Surgical History:  Procedure Laterality Date  . APPENDECTOMY    . CARDIAC CATHETERIZATION   07/30/2006   CORONARY ANGIOPLASTY WITH STENT PLACEMENT  . CARDIAC CATHETERIZATION    . EXPLORATORY LAPAROTOMY     age 86  . EXPLORATORY LAPAROTOMY    . IR THORACENTESIS ASP PLEURAL SPACE W/IMG GUIDE  10/05/2019    Vitals:   11/17/19 1609  BP: (!) 143/78  Pulse: 88     Subjective Assessment - 11/17/19 1538    Subjective Patient reports that was away for the weekend. Patient reports that he has been having pain over the weekend, today feels like tension. No falls to report. Patient also reports that he hit his head on the hood in kitchen with cleaning.    Patient is accompained by: Family member    Pertinent History PMH: BPH, CAD, HTN, CKD III, prediabetes, mild cognitive impairment.    Patient Stated Goals wants to walk without worrying about potentially having a dizzy spell - reports 4-5 spells a day that lasts only a couple of seconds.    Currently in Pain? Yes    Pain Score 6     Pain Location Neck    Pain Orientation Left    Pain Descriptors / Indicators Aching    Pain Type Acute pain    Pain Onset In the past 7 days              [facility] PT Assessment - 11/17/19  0001      Functional Gait  Assessment   Gait assessed  Yes    Gait Level Surface Walks 20 ft in less than 7 sec but greater than 5.5 sec, uses assistive device, slower speed, mild gait deviations, or deviates 6-10 in outside of the 12 in walkway width.    Change in Gait Speed Able to smoothly change walking speed without loss of balance or gait deviation. Deviate no more than 6 in outside of the 12 in walkway width.    Gait with Horizontal Head Turns Performs head turns smoothly with no change in gait. Deviates no more than 6 in outside 12 in walkway width    Gait with Vertical Head Turns Performs head turns with no change in gait. Deviates no more than 6 in outside 12 in walkway width.    Gait and Pivot Turn Pivot turns safely within 3 sec and stops quickly with no loss of balance.    Step Over Obstacle Is able to  step over one shoe box (4.5 in total height) without changing gait speed. No evidence of imbalance.    Gait with Narrow Base of Support Ambulates 7-9 steps.    Gait with Eyes Closed Walks 20 ft, uses assistive device, slower speed, mild gait deviations, deviates 6-10 in outside 12 in walkway width. Ambulates 20 ft in less than 9 sec but greater than 7 sec.    Ambulating Backwards Walks 20 ft, uses assistive device, slower speed, mild gait deviations, deviates 6-10 in outside 12 in walkway width.    Steps Alternating feet, must use rail.    Total Score 24               [facility] Adult PT Treatment/Exercise - 11/17/19 0001      Transfers   Transfers Sit to Stand;Stand to Sit    Sit to Stand 6: Modified independent (Device/Increase time)    Stand to Sit 6: Modified independent (Device/Increase time)      Ambulation/Gait   Ambulation/Gait Yes    Ambulation/Gait Assistance 6: Modified independent (Device/Increase time)    Ambulation/Gait Assistance Details throughout therapy gym without AD    Assistive device None    Gait Pattern Step-through pattern;Decreased stride length;Decreased arm swing - left    Ambulation Surface Level;Indoor      Self-Care   Self-Care Other Self-Care Comments    Other Self-Care Comments  Patient reporting increased pain in neck that started after long car ride and sleeping in recliner. PT palpating and noticing increased muscle tightness in L upper trap and L levator scap. PT educating on use of heat for reduced muscle tension and improvements in pain.        Neuro Re-ed    Neuro Re-ed Details  Completed M-CSTIB, patient able to hold situation 1-3 for full 30 seconds. Patient able to hold siutaiton 4 on avg. 25 secs.               PT Education - 11/17/19 1620    Education Details PT educated on contuining HEP/walking to maintain gains; progress toward LTG; heat for pain (see self care section)    Person(s) Educated Patient;Spouse    Methods  Explanation;Demonstration;Handout    Comprehension Verbalized understanding;Returned demonstration            PT Short Term Goals - 10/14/19 1625      PT SHORT TERM GOAL #1   Title STG = LTG  PT Long Term Goals - 11/17/19 1555      PT LONG TERM GOAL #1   Title Pt will be independent with final HEP in order to build upon functional gains made in therapy. ALL LTGS DUE 10/14/19    Baseline independent with final HEP    Time 4    Period Weeks    Status Achieved      PT LONG TERM GOAL #2   Title Patient will improve FGA to >/= 24/30 to demonstrate improved balance and reduced fall risk    Baseline 20/30, 24/30    Time 4    Period Weeks    Status Achieved      PT LONG TERM GOAL #3   Title Patient will be able to hold situation 3 of M-CSTIB for full 30 seconds, and situation 4 for >/= 15 seconds to demonstrate improved balance    Baseline situation 3: 26 seconds, situation 4: </= 5 seconds; siutation 3: 30 seconds, situation 4: 25 secs average    Time 4    Period Weeks    Status Achieved                 Plan - 11/17/19 1621    Clinical Impression Statement Today's skilled PT session included assessment of patient's progress toward all LTG's. Patient able to meet all LTG today during session and is demonstating improved dynamic balance and functional mobility. Patient scored 24/30 on FGA today, demonstrating decreased risk for falls. PT educating to continue HEP and walking, and importance of safety with ambulation especially on outdoor surfaces. PT stating readiness for discharge, with patient/wife verbalizing agreement.    Personal Factors and Comorbidities Comorbidity 3+;Past/Current Experience;Behavior Pattern    Comorbidities BPH, CAD, HTN, CKD III, prediabetes, mild cognitive impairment,    Examination-Activity Limitations Transfers;Stairs;Locomotion Level    Examination-Participation Restrictions Community Activity    Stability/Clinical Decision Making  Evolving/Moderate complexity    Rehab Potential Good    PT Frequency 1x / week    PT Duration 4 weeks    PT Treatment/Interventions ADLs/Self Care Home Management;DME Instruction;Stair training;Therapeutic activities;Functional mobility training;Gait training;Therapeutic exercise;Balance training;Neuromuscular re-education;Patient/family education;Vestibular    PT Home Exercise Plan Access Code: [email or web address]    Consulted and Agree with Plan of Care Patient;Family member/caregiver    Family Member Consulted pt's spouse           Patient will benefit from skilled therapeutic intervention in order to improve the following deficits and impairments:  Abnormal gait, Decreased balance, Decreased activity tolerance, Decreased coordination, Decreased knowledge of use of DME, Decreased safety awareness, Decreased strength, Difficulty walking, Dizziness, Impaired UE functional use  Visit Diagnosis: Muscle weakness (generalized)  Unsteadiness on feet  Other abnormalities of gait and mobility     Problem List Patient Active Problem List   Diagnosis Date Noted  . Fracture of multiple ribs with pain 08/10/2019  . Clavicle fracture 08/10/2019  . Closed displaced fracture of phalanx of left thumb, sequela 08/10/2019  . TBI (traumatic brain injury) ([place]) 07/23/2019  . Traumatic closed fracture of distal clavicle with minimal displacement, left, initial encounter 07/19/2019  . Traumatic brain injury with loss of consciousness ([place])   . Multiple trauma   . Benign prostatic hyperplasia   . Essential hypertension   . AKI (acute kidney injury) ([place])   . Stage 3b chronic kidney disease ([place])   . Prediabetes   . Acute blood loss anemia   . Thrombocytopenia ([place])   . Fall 07/14/2019  . Type  2 diabetes mellitus ([place])   . Eosinophilia   . Need for prophylactic vaccination and inoculation against influenza 11/12/2013  . Nocturnal leg cramps 11/23/2012  . Pain in joint, shoulder region 11/23/2012  .  Fall 11/04/2012  . Hematoma 11/04/2012  . Acute upper respiratory infections of unspecified site 11/04/2012  . HYPERLIPIDEMIA TYPE IIB / III 03/08/2008  . Orthostatic hypotension 03/08/2008  . CAD, NATIVE VESSEL 03/08/2008    Jones Bales, PT, DPT 11/17/2019, 4:24 PM  [facility] [address] [place], [place], [address] Phone: [phone]   Fax:  [phone]  Name: [name] MRN: [id number] Date of Birth: 10-27-1933

## 2019-11-17 NOTE — Patient Instructions (Addendum)
Cognitive activities for home:   Review soccer schedule and choose games of interest: write down dates, times, and teams on your calendar.   Homework: Research games (include international) that you might be interested in watching. Write the information in your chart. Add the games you want to watch to your calendar.

## 2019-11-18 NOTE — Telephone Encounter (Signed)
Called.

## 2019-11-19 ENCOUNTER — Other Ambulatory Visit: Payer: Self-pay

## 2019-11-19 ENCOUNTER — Ambulatory Visit: Payer: PPO | Admitting: Speech Pathology

## 2019-11-19 ENCOUNTER — Ambulatory Visit: Payer: PPO | Admitting: Occupational Therapy

## 2019-11-19 DIAGNOSIS — R41841 Cognitive communication deficit: Secondary | ICD-10-CM

## 2019-11-19 DIAGNOSIS — S069X0D Unspecified intracranial injury without loss of consciousness, subsequent encounter: Secondary | ICD-10-CM

## 2019-11-19 NOTE — Therapy (Addendum)
[facility] [address] [place], [place], [address] Phone: [phone]   Fax:  [phone]  Speech Language Pathology Treatment and Discharge Summary  Patient Details  Name: [name] MRN: [id number] Date of Birth: May 18, 1933 Referring Provider (SLP): Reesa Chew, PA-C   Encounter Date: 11/19/2019   End of Session - 11/19/19 1550    Visit Number 17    Number of Visits 17    Date for SLP Re-Evaluation 11/21/19   date extended due to missed appointments (illness)   SLP Start Time 1448    SLP Stop Time  1530    SLP Time Calculation (min) 42 min    Activity Tolerance Patient tolerated treatment well           Past Medical History:  Diagnosis Date  . Allergy   . BPH (benign prostatic hyperplasia)   . BPH (benign prostatic hypertrophy)   . CAD (coronary artery disease)    s/p cypher DES to pLAD 6/08; normal LVF;  ETT-Myoview 2009: no ischemia   . Coronary artery disease   . Eosinophilia   . High cholesterol   . HTN (hypertension)   . Hyperlipidemia   . Hypertension   . MI (myocardial infarction) ([place])   . Myocardial infarction ([place])   . Prediabetes   . Trigeminal neuralgia     Past Surgical History:  Procedure Laterality Date  . APPENDECTOMY    . CARDIAC CATHETERIZATION  07/30/2006   CORONARY ANGIOPLASTY WITH STENT PLACEMENT  . CARDIAC CATHETERIZATION    . EXPLORATORY LAPAROTOMY     age 86  . EXPLORATORY LAPAROTOMY    . IR THORACENTESIS ASP PLEURAL SPACE W/IMG GUIDE  10/05/2019    There were no vitals filed for this visit.   Subjective Assessment - 11/19/19 1540    Subjective "I'm going to blow you away." Pt took notes and stats during soccer game    Patient is accompained by: Family member   wife   Currently in Pain? No/denies                 ADULT SLP TREATMENT - 11/19/19 1543      General Information   Behavior/Cognition Alert;Cooperative;Pleasant mood;Impulsive;Requires  cueing      Treatment Provided   Treatment provided Cognitive-Linquistic      Cognitive-Linquistic Treatment   Treatment focused on Cognition;Patient/family/caregiver education    Skilled Treatment Pt took notes on [employer] soccer game; independently opened notebook and began telling SLP summary of the game. Pt agreed he would enjoy taking game stats, and that this would be a good way for him to work on his attention, continue use of schedule and planner after ST d/c. We generated a stat sheet and pt ID'd types of stats he would like to collect while watching games. Pt reviewed draft and noted that date, location, and television channel were absent from the tracker sheet, and we updated this. When reviewing standings sheet, pt required occasional mod A for awareness of errors (stat retrieval from incorrect lines). Pt independently referenced his calendar to state details and date of next game he plans to watch. Pt and spouse demonstrate understanding of recommendations and how to continue working on pt's use of compensations at home; in agreement with d/c today.       Assessment / Recommendations / Plan   Plan Continue with current plan of care      Progression Toward Goals   Progression toward goals Progressing toward goals  SLP Education - 11/19/19 1549    Education Details cognitive activities for home    Person(s) Educated Patient;Spouse    Methods Explanation    Comprehension Verbalized understanding            SLP Short Term Goals - 11/19/19 1600      SLP SHORT TERM GOAL #1   Title pt will demo knowledge of his memory copmensation system by attempt to use with rare min cues in 2 sessions    Baseline 09-16-19    Status Partially Met      SLP [place] #2   Title pt will demo correct/successful orientation to a memory compensation system in 3 sessions    Status Not Met      SLP SHORT TERM GOAL #3   Title pt will tell SLP 2 cognitive deficits  independently, and how they may impact him at home with rare min A in 3 sessions    Status Not Met      SLP SHORT TERM GOAL #4   Title pt will undergo further cognitive linguisitic testing PRN    Period --   or 7 total sessions   Status Achieved      SLP SHORT TERM GOAL #5   Title Patient will use compensations for attention during functional task (eg conversation or reading on topic of interest) with min cues x 2 sessions.    Status Not Met            SLP Long Term Goals - 11/19/19 1600      SLP LONG TERM GOAL #1   Title pt will demo correct usage of a memory compensation system for appointment and/or medication management, to-do lists, daily schedules in 3 sessions    Time 1    Period Weeks   or 17 total sessions, for all LTGs   Status Not Met      SLP LONG TERM GOAL #2   Title pt will demo emergent awareness by ID'ing 75% of errors in cognitive linguistic tasks with double-checking x 2 sessions.    Time 1    Period Weeks    Status Not Met      SLP LONG TERM GOAL #3   Title Pt will maintain selective attention in functional task for 8 minutes in mod noisy environment with rare min cues x 2 sessions.    Time 1    Period Weeks    Status Not Met            Plan - 11/19/19 1551    Clinical Impression Statement Pt presents today with overall moderate-severe cognitive communication deficits; most significantly in attention and memory. Level of cuing for error awareness, impulsivity remain commensurate with previous sessions. Pt has reached max rehab potential at this time and pt and spouse are in agreement with planned d/c. Today we generated structure to aid pt in tracking stats for soccer games and SLP completed education on activities for home to continue working on pt's cognitive skills and carryover of compensations. Recommended if pt wishes to drive that he should have an OT driving evaluation due to cognitive concerns.    Speech Therapy Frequency 2x / week    Duration --    8 weeks or 17 total sessions   Treatment/Interventions Language facilitation;Cueing hierarchy;Cognitive reorganization;Internal/external aids;Patient/family education;Compensatory strategies;SLP instruction and feedback;Functional tasks;Environmental controls    Potential to Achieve Goals Good    Potential Considerations Severity of impairments;Ability to learn/carryover information    Consulted  and Agree with Plan of Care Patient           Patient will benefit from skilled therapeutic intervention in order to improve the following deficits and impairments:   Cognitive communication deficit  Traumatic brain injury, without loss of consciousness, subsequent encounter    Problem List Patient Active Problem List   Diagnosis Date Noted  . Fracture of multiple ribs with pain 08/10/2019  . Clavicle fracture 08/10/2019  . Closed displaced fracture of phalanx of left thumb, sequela 08/10/2019  . TBI (traumatic brain injury) ([place]) 07/23/2019  . Traumatic closed fracture of distal clavicle with minimal displacement, left, initial encounter 07/19/2019  . Traumatic brain injury with loss of consciousness ([place])   . Multiple trauma   . Benign prostatic hyperplasia   . Essential hypertension   . AKI (acute kidney injury) ([place])   . Stage 3b chronic kidney disease ([place])   . Prediabetes   . Acute blood loss anemia   . Thrombocytopenia ([place])   . Fall 07/14/2019  . Type 2 diabetes mellitus ([place])   . Eosinophilia   . Need for prophylactic vaccination and inoculation against influenza 11/12/2013  . Nocturnal leg cramps 11/23/2012  . Pain in joint, shoulder region 11/23/2012  . Fall 11/04/2012  . Hematoma 11/04/2012  . Acute upper respiratory infections of unspecified site 11/04/2012  . HYPERLIPIDEMIA TYPE IIB / III 03/08/2008  . Orthostatic hypotension 03/08/2008  . CAD, NATIVE VESSEL 03/08/2008   SPEECH THERAPY DISCHARGE SUMMARY  Visits from Start of Care: 17  Current functional level  related to goals / functional outcomes: 3/3 LTGs not met; awareness has hindered progress. Pt is using his memory compensation system with mod cues necessary to access relevant information re: appointments and therapies. He did have better independence/accuracy with this when referencing high-interest topic (soccer).    Remaining deficits: Moderate to severe cognitive communication deficits in attention, recall, awareness and higher-level functions.   Education / Equipment: Ongoing cognitive activities for home, use of compensations for memory.   Plan: Patient agrees to discharge.  Patient goals were not met. Patient is being discharged due to lack of progress.  ?????        Deneise Lever, [place], CCC-SLP Speech-Language Pathologist  Aliene Altes 11/19/2019, 4:01 PM  [facility] [address] [place], [place], [address] Phone: [phone]   Fax:  [phone]   Name: [name] MRN: [id number] Date of Birth: 1933/05/24

## 2019-11-20 ENCOUNTER — Ambulatory Visit: Payer: PPO | Admitting: Cardiovascular Disease

## 2019-11-20 ENCOUNTER — Other Ambulatory Visit (HOSPITAL_COMMUNITY)
Admission: RE | Admit: 2019-11-20 | Discharge: 2019-11-20 | Disposition: A | Discharge status: Home / Self Care | Payer: PPO | Source: Ambulatory Visit | Attending: Pulmonary Disease | Admitting: Pulmonary Disease

## 2019-11-20 ENCOUNTER — Encounter: Payer: Self-pay | Admitting: Pulmonary Disease

## 2019-11-20 ENCOUNTER — Ambulatory Visit (INDEPENDENT_AMBULATORY_CARE_PROVIDER_SITE_OTHER): Payer: PPO | Admitting: Pulmonary Disease

## 2019-11-20 ENCOUNTER — Other Ambulatory Visit: Payer: Self-pay

## 2019-11-20 ENCOUNTER — Ambulatory Visit (INDEPENDENT_AMBULATORY_CARE_PROVIDER_SITE_OTHER): Payer: PPO

## 2019-11-20 ENCOUNTER — Ambulatory Visit (HOSPITAL_COMMUNITY)
Admission: RE | Admit: 2019-11-20 | Discharge: 2019-11-20 | Disposition: A | Discharge status: Home / Self Care | Payer: PPO | Source: Ambulatory Visit | Attending: Cardiology | Admitting: Cardiology

## 2019-11-20 VITALS — BP 150/86 | HR 94 | Temp 97.6°F | Ht 66.75 in | Wt 182.2 lb

## 2019-11-20 DIAGNOSIS — I6523 Occlusion and stenosis of bilateral carotid arteries: Secondary | ICD-10-CM | POA: Diagnosis not present

## 2019-11-20 DIAGNOSIS — J90 Pleural effusion, not elsewhere classified: Secondary | ICD-10-CM

## 2019-11-20 DIAGNOSIS — J9811 Atelectasis: Secondary | ICD-10-CM | POA: Diagnosis not present

## 2019-11-20 DIAGNOSIS — J948 Other specified pleural conditions: Secondary | ICD-10-CM | POA: Diagnosis not present

## 2019-11-20 LAB — BODY FLUID CELL COUNT WITH DIFFERENTIAL
Eos, Fluid: 1 %
Lymphs, Fluid: 76 %
Monocyte-Macrophage-Serous Fluid: 20 % — ABNORMAL LOW (ref 50–90)
Neutrophil Count, Fluid: 3 % (ref 0–25)
Total Nucleated Cell Count, Fluid: 313 cu mm (ref 0–1000)

## 2019-11-20 LAB — ALBUMIN, PLEURAL OR PERITONEAL FLUID: Albumin, Fluid: 2.7 g/dL

## 2019-11-20 IMAGING — DX DG CHEST 2V
2 series · 2 of 2 positions shown · non-contrast
Comparison: [DATE]

CLINICAL DATA: Post thoracentesis

EXAM:
CHEST - 2 VIEW

[chest pa (1 of 2)]
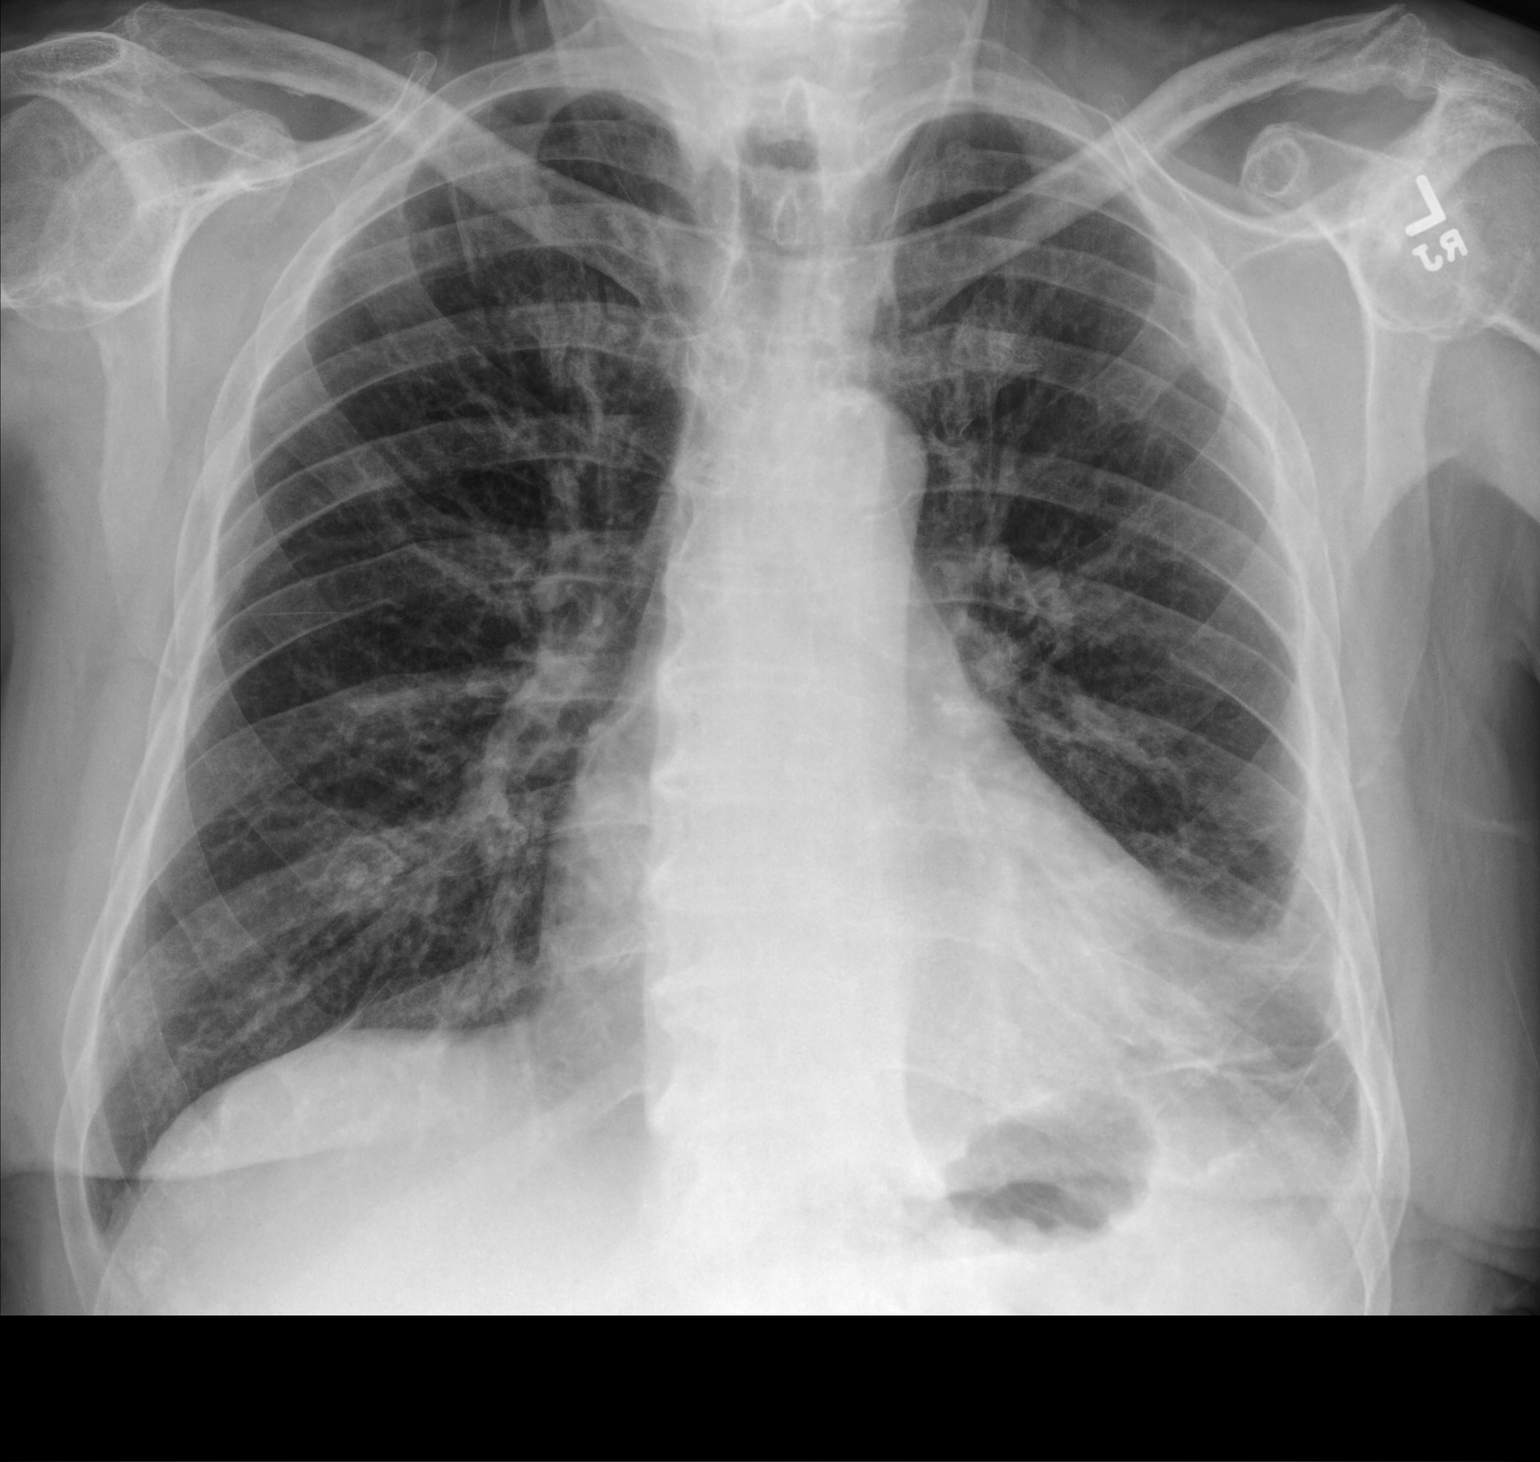

[chest pa (2 of 2)]
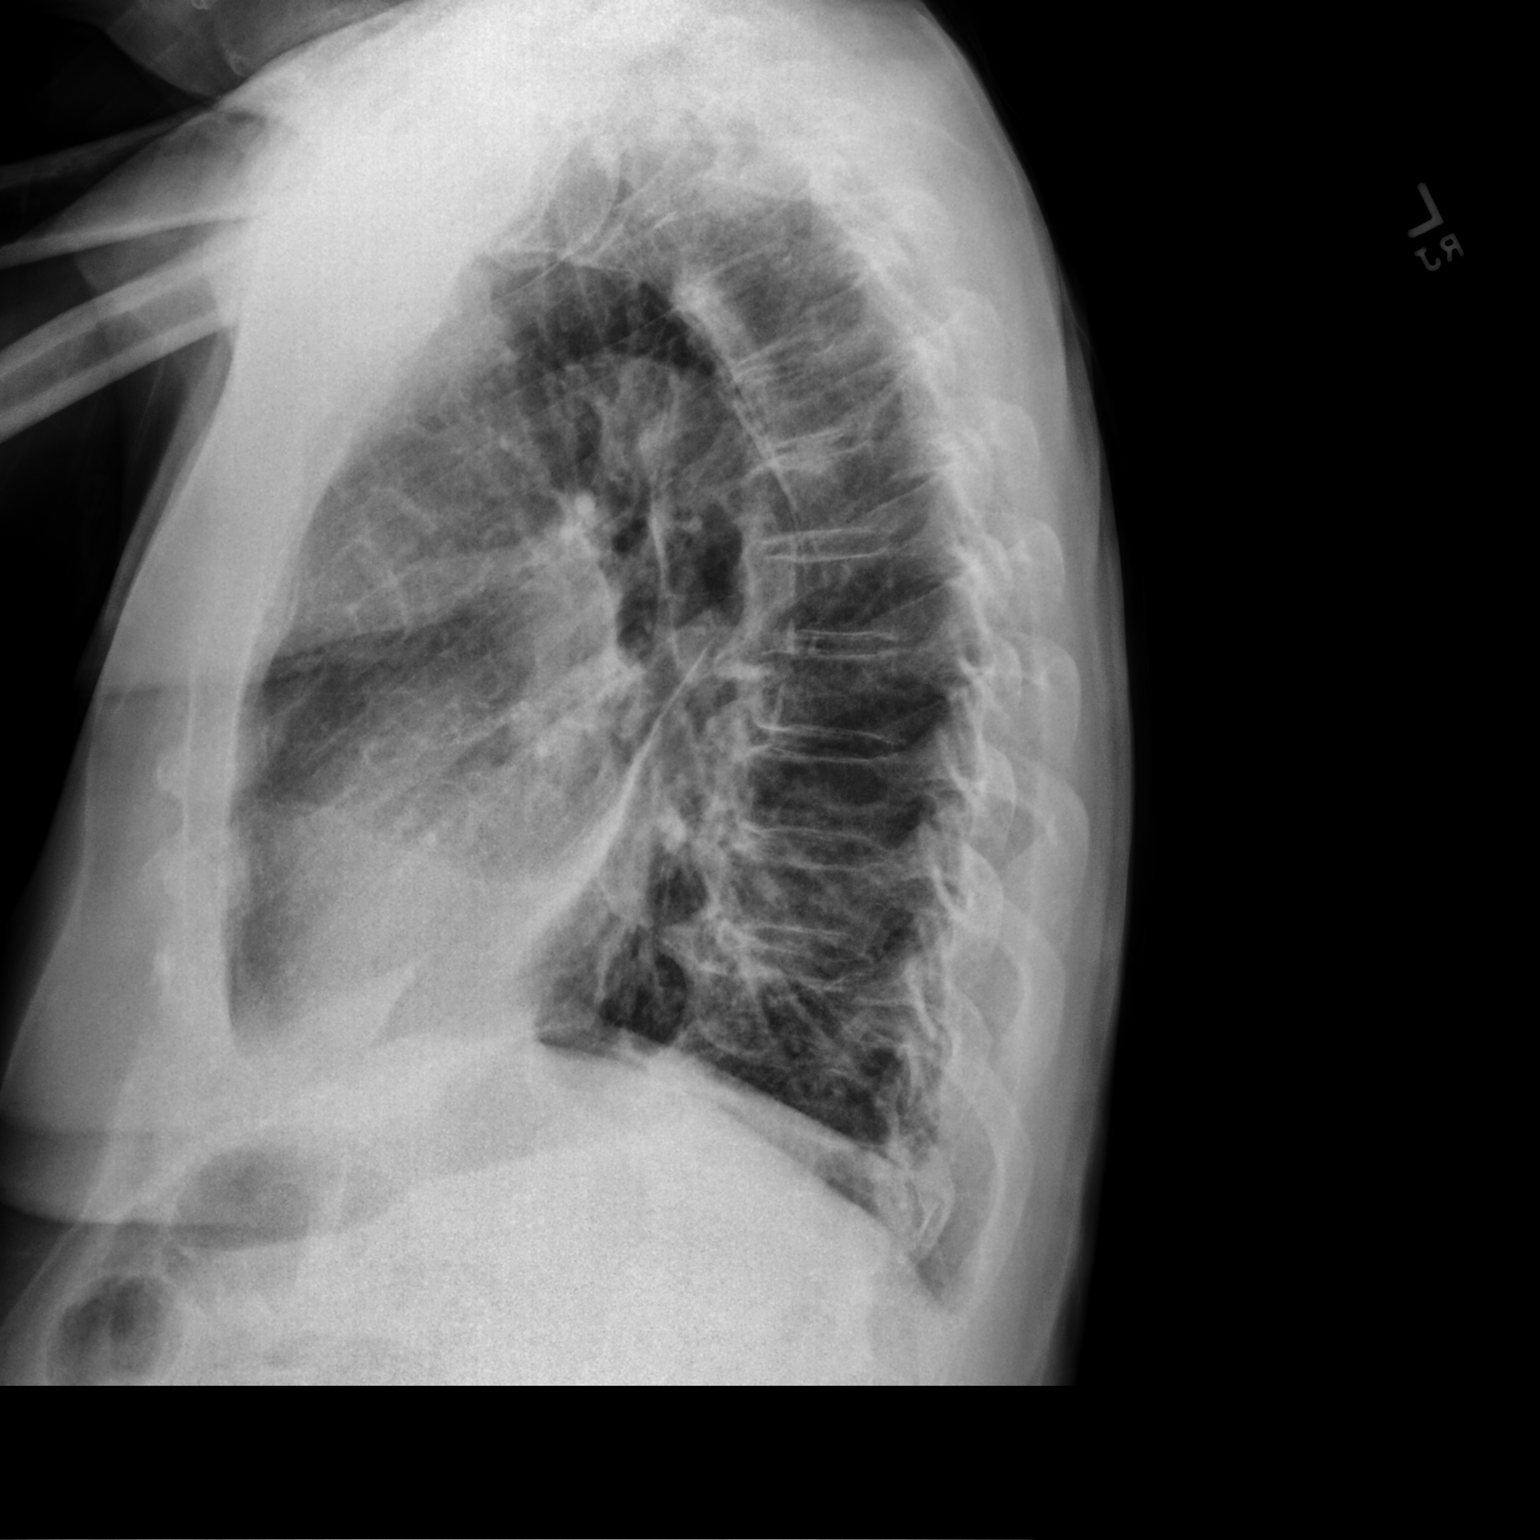

[2 of 2 positions shown; findings below may reference images not displayed]

FINDINGS: Heart size and pulmonary vascularity are normal. Decreasing size of
left pleural effusion and basilar atelectasis since previous study.
No pneumothorax. Right lung is clear. Old left rib fractures.
Degenerative changes in the spine and shoulders. Vascular
calcifications.
IMPRESSION: Decreasing size of left pleural effusion and basilar atelectasis
since previous study. No pneumothorax.

## 2019-11-20 NOTE — Progress Notes (Signed)
Thoracentesis  Procedure Note  MOZELL HARDACRE  [id number]  29-Jun-1933  Date:11/20/19  Time:12:36 PM   Provider Performing:Anum Palecek R Silvestre [place]   Procedure: Thoracentesis with imaging guidance (38937)  Indication(s) Pleural Effusion  Consent Risks of the procedure as well as the alternatives and risks of each were explained to the patient and/or caregiver.  Consent for the procedure was obtained and is signed in the bedside chart  Anesthesia Topical only with 1% lidocaine    Time Out Verified patient identification, verified procedure, site/side was marked, verified correct patient position, special equipment/implants available, medications/allergies/relevant history reviewed, required imaging and test results available.   Sterile Technique Maximal sterile technique including full sterile barrier drape, hand hygiene, sterile gloves, mask, hair covering, sterile ultrasound probe cover (if used).  Procedure Description Ultrasound was used to identify appropriate pleural anatomy for placement and overlying skin marked.  Area of drainage cleaned and draped in sterile fashion. Lidocaine was used to anesthetize the skin and subcutaneous tissue.  1700 cc's of serous appearing fluid was drained from the left pleural space. Procedure was terminated due to cessation of fluid flow. Catheter then removed and bandaid applied to site. Ultrasound revealed no pleural effusion after completion of procedure.   Complications/Tolerance None; patient tolerated the procedure well. Chest X-ray is ordered to confirm no post-procedural complication given mild but improving left shoulder pain.   EBL none   Specimen(s) Pleural fluid

## 2019-11-20 NOTE — Progress Notes (Signed)
@Patient  ID: [name], male    DOB: 1934-01-05, 86 y.o.   MRN: [id number]  Chief Complaint  Patient presents with  . Procedure    thoracentesis    Referring provider: [name], MD  HPI:   86 year old admitted after fall with broken ribs 07/2019 then readmitted couple weeks later with febrile illness treated for pneumonia with associated effusion status post drainage 2 months later 10/06/2019 exudate by protein number seen in follow-up pleural effusion.  Overall he feels well.  Some dyspnea but not much change.  Unsure if much better.  No orthopnea or PND.  Here today for planned thoracentesis.  Is on no blood thinners.  Denies any hemoptysis.  Questionaires / Pulmonary Flowsheets:   ACT:  No flowsheet data found.  MMRC: No flowsheet data found.  Epworth:  No flowsheet data found.  Tests:   FENO:  No results found for: NITRICOXIDE  PFT: No flowsheet data found.  WALK:  No flowsheet data found.  Imaging: Personally reviewed DG Chest Portable 1 View  Result Date: 11/01/2019 CLINICAL DATA:  Increasing shortness of breath and cough. EXAM: PORTABLE CHEST 1 VIEW COMPARISON:  10/30/2019 and prior radiographs. FINDINGS: The cardiomediastinal silhouette is unchanged. A small LEFT pleural effusion and LEFT LOWER lung consolidation/atelectasis are again identified. The RIGHT lung is clear. There is no evidence of pneumothorax. No acute bony abnormalities are present. Remote LEFT rib fractures are again identified. IMPRESSION: Unchanged small LEFT pleural effusion and LEFT LOWER lung consolidation/atelectasis. Electronically Signed   By: Margarette Canada M.D.   On: 11/01/2019 10:36   DG Chest Port 1 View  Result Date: 10/30/2019 CLINICAL DATA:  Cough, sore throat, dyspnea EXAM: PORTABLE CHEST 1 VIEW COMPARISON:  10/05/2019 FINDINGS: Moderate left pleural effusion has developed. Retrocardiac opacification may relate to posteriorly layering pleural fluid, atelectasis, or  focal infiltrate within this region. Lungs are otherwise clear. No pneumothorax. No pleural effusion on the right. Cardiac size within normal limits. Pulmonary vascularity is normal. IMPRESSION: Enlarging, now moderate left pleural effusion. Retrocardiac opacification now present representing atelectasis or infiltrate within this region. In Electronically Signed   By: Fidela Salisbury MD   On: 10/30/2019 01:03    Lab Results: Personally reviewed CBC    Component Value Date/Time   WBC 4.9 11/01/2019 1250   RBC 3.99 (L) 11/01/2019 1250   HGB 11.7 (L) 11/01/2019 1250   HCT 37.1 (L) 11/01/2019 1250   PLT 183 11/01/2019 1250   MCV 93.0 11/01/2019 1250   MCV 100.0 (A) 09/28/2012 0846   MCH 29.3 11/01/2019 1250   MCHC 31.5 11/01/2019 1250   RDW 14.4 11/01/2019 1250   LYMPHSABS 0.9 11/01/2019 1250   MONOABS 0.6 11/01/2019 1250   EOSABS 0.4 11/01/2019 1250   BASOSABS 0.0 11/01/2019 1250    BMET    Component Value Date/Time   NA 136 11/01/2019 1250   K 3.9 11/01/2019 1250   CL 102 11/01/2019 1250   CO2 24 11/01/2019 1250   GLUCOSE 103 (H) 11/01/2019 1250   BUN 14 11/01/2019 1250   CREATININE 1.35 (H) 11/01/2019 1250   CREATININE 1.51 (H) 08/25/2019 1301   CALCIUM 8.7 (L) 11/01/2019 1250   GFRNONAA 48 (L) 11/01/2019 1250   GFRNONAA 42 (L) 08/25/2019 1301   GFRAA 55 (L) 11/01/2019 1250   GFRAA 48 (L) 08/25/2019 1301    BNP    Component Value Date/Time   BNP 105.9 (H) 11/01/2019 1250    ProBNP    Component Value  Date/Time   PROBNP 45.0 07/29/2006 1440    Specialty Problems      Pulmonary Problems   Acute upper respiratory infections of unspecified site      Allergies  Allergen Reactions  . Gabapentin     Visual changes    Immunization History  Administered Date(s) Administered  . Influenza Split 10/06/2012  . Influenza, High Dose Seasonal PF 12/02/2016, 10/26/2018, 10/27/2018, 10/23/2019  . Influenza,inj,Quad PF,6+ Mos 11/12/2013, 11/11/2014, 10/20/2015,  11/12/2017  . Moderna SARS-COVID-2 Vaccination 03/04/2019, 04/03/2019  . Pneumococcal Conjugate-13 03/17/2013  . Pneumococcal Polysaccharide-23 02/05/2001  . Td 03/01/2009  . Zoster 02/25/2008    Past Medical History:  Diagnosis Date  . Allergy   . BPH (benign prostatic hyperplasia)   . BPH (benign prostatic hypertrophy)   . CAD (coronary artery disease)    s/p cypher DES to pLAD 6/08; normal LVF;  ETT-Myoview 2009: no ischemia   . Coronary artery disease   . Eosinophilia   . High cholesterol   . HTN (hypertension)   . Hyperlipidemia   . Hypertension   . MI (myocardial infarction) ([place])   . Myocardial infarction ([place])   . Prediabetes   . Trigeminal neuralgia     Tobacco History: Social History   Tobacco Use  Smoking Status Never Smoker  Smokeless Tobacco Never Used  Tobacco Comment   quit over 23yrs ago.   Counseling given: Not Answered Comment: quit over 40yrs ago.   Continue to not smoke  Outpatient Encounter Medications as of 11/20/2019  Medication Sig  . acetaminophen (TYLENOL) 500 MG tablet Take 1,000 mg by mouth every 6 (six) hours as needed for moderate pain.  Marland Kitchen albuterol (PROAIR HFA) 108 (90 Base) MCG/ACT inhaler INHALE 2 PUFFS INTO THE LUNGS EVERY 6 HOURS AS NEEDED FOR WHEEZING OR SHORTNESS OF BREATH (Patient taking differently: Inhale 2 puffs into the lungs every 6 (six) hours as needed for wheezing or shortness of breath. INHALE 2 PUFFS INTO THE LUNGS EVERY 6 HOURS AS NEEDED FOR WHEEZING OR SHORTNESS OF BREATH)  . Apoaequorin (PREVAGEN) 10 MG CAPS Take 10 mg by mouth daily.  Marland Kitchen atorvastatin (LIPITOR) 40 MG tablet Take 40 mg by mouth daily.  . Calcium-Vitamin D 600-200 MG-UNIT tablet Take 1 tablet by mouth 2 (two) times daily. (Patient taking differently: Take 1 tablet by mouth daily. )  . doxazosin (CARDURA) 2 MG tablet Take 1 tablet (2 mg total) by mouth daily.  Marland Kitchen escitalopram (LEXAPRO) 10 MG tablet TAKE 1 TABLET BY MOUTH EVERY DAY (Patient taking  differently: Take 10 mg by mouth daily. )  . ezetimibe (ZETIA) 10 MG tablet Take 1 tablet (10 mg total) by mouth daily. Please make yearly appt with Dr. [name] for October for future refills. 1st attempt  . fluticasone furoate-vilanterol (BREO ELLIPTA) 200-25 MCG/INH AEPB TAKE 1 PUFF BY MOUTH EVERY DAY  . loratadine (CLARITIN) 10 MG tablet Take by mouth.  . Multiple Vitamin (MULTIVITAMIN WITH MINERALS) TABS tablet Take 1 tablet by mouth daily.  Marland Kitchen ezetimibe-simvastatin (VYTORIN) 10-10 MG tablet Take 1 tablet by mouth at bedtime. (Patient not taking: Reported on 11/20/2019)   No facility-administered encounter medications on file as of 11/20/2019.       Physical Exam  BP (!) 150/86 (BP Location: Right Arm, Cuff Size: Normal)   Pulse 94   Temp 97.6 F (36.4 C) (Temporal)   Ht 5' 6.75" (1.695 m)   Wt 182 lb 3.2 oz (82.6 kg)   SpO2 95%   BMI 28.75 kg/m  Wt Readings from Last 5 Encounters:  11/20/19 182 lb 3.2 oz (82.6 kg)  11/04/19 183 lb (83 kg)  11/01/19 173 lb (78.5 kg)  09/30/19 185 lb 6.4 oz (84.1 kg)  09/23/19 186 lb (84.4 kg)    BMI Readings from Last 5 Encounters:  11/20/19 28.75 kg/m  11/04/19 28.24 kg/m  11/01/19 26.70 kg/m  09/30/19 29.04 kg/m  09/23/19 29.13 kg/m     Physical Exam  General: Well-appearing, no acute distress Respiratory: Normal work of breathing, on room air Extremities: Warm, no edema present  Assessment & Plan:   Recurrent left pleural effusion: Status post thoracentesis late August 2021.  Studies consistent with exudate via protein.  Lymphocytic predominance.  Unclear etiology.  Burtis Junes most likely related to postinflammatory changes after fall on left side versus what is more likely parapneumonic effusion after febrile illness treated with antibiotics 08/2019.  Serial chest x-rays have showed that the fluid has reaccumulated although not to the level as it was prior but certainly appears larger than the post thoracentesis chest x-ray.   Repeat thoracentesis in the office today.  Simple, hypoechoic appearing fluid on ultrasound. Will resend diagnostic studies.  If continues to recur, have to consider other etiologies of recurrent pleural effusion. CXR at next visit ordered.  Dyspnea on exertion: Likely related to pleural effusion.  Unclear how much better he got after first draining.  Assess response after drain today.    Return in about 2 months (around 01/20/2020).   Lanier Clam, MD 11/20/2019

## 2019-11-20 NOTE — Patient Instructions (Addendum)
We drained the fluid dry! None left on the ultrasound.  We will send it off for studies.  Let me know if shortness of breath gets better. If worsens over time, we can re-evaluate if the fluid is back.   Notification of test results are managed in the following manner: If there are  any recommendations or changes to the  plan of care discussed in office today,  we will contact you and let you know what they are. If you do not hear from [place], then your results are normal and you can view them through your  MyChart account , or a letter will be sent to you. Thank you again for trusting [place] with your care  [facility].  Come back in 2 months with Dr. Silas Flood and we will have a visit and repeat a chest xray. Sooner if needed.

## 2019-11-23 LAB — CYTOLOGY - NON PAP

## 2019-11-23 NOTE — Progress Notes (Signed)
CXR better after draining the fluid from lung.

## 2019-11-24 ENCOUNTER — Telehealth: Payer: Self-pay | Admitting: Pulmonary Disease

## 2019-11-24 LAB — GRAM STAIN/BODY FLUID CULTURE: Organism ID, Bacteria: NONE SEEN

## 2019-11-24 LAB — BODY FLUID CULTURE

## 2019-11-24 NOTE — Telephone Encounter (Signed)
Spoke with pt. He is aware of Dr. Kavin Leech response. Nothing further was needed.

## 2019-11-24 NOTE — Telephone Encounter (Signed)
I am sorry to hear this. The chest xray after the procedure showed no complication which is good. However, my suspicion is the fluid was there for long enough to make the lung a bit stiff. As such, now that the fluid is removed, the lung will not open up fully and is causing pain as it stretches. I would recommend taking tylenol 1000 mg in the morning and late afternoon (twice a day) for the next 3 days and see if that helps. Let me know what you think!

## 2019-11-24 NOTE — Telephone Encounter (Signed)
Spoke with pt. States that he has been having some discomfort since having a thoracentesis on Friday. Reports that the "pain" on left side is worse when taking in a deep breath. Rates that pain at a 2 when breathing normal and a 4-5 when taking a deep breath. Pt states that he is not concerned but wanted Dr. Silas Flood to be aware of this information.  Dr. Silas Flood - please advise if you have any recommendations for the pt. Thanks.

## 2019-11-25 ENCOUNTER — Encounter: Payer: Self-pay | Admitting: Physical Medicine and Rehabilitation

## 2019-11-25 ENCOUNTER — Encounter: Payer: PPO | Attending: Registered Nurse | Admitting: Physical Medicine and Rehabilitation

## 2019-11-25 ENCOUNTER — Telehealth: Payer: Self-pay

## 2019-11-25 ENCOUNTER — Other Ambulatory Visit: Payer: Self-pay

## 2019-11-25 VITALS — Temp 98.2°F | Ht 66.75 in

## 2019-11-25 DIAGNOSIS — S069X9D Unspecified intracranial injury with loss of consciousness of unspecified duration, subsequent encounter: Secondary | ICD-10-CM | POA: Insufficient documentation

## 2019-11-25 DIAGNOSIS — E1100 Type 2 diabetes mellitus with hyperosmolarity without nonketotic hyperglycemic-hyperosmolar coma (NKHHC): Secondary | ICD-10-CM | POA: Diagnosis not present

## 2019-11-25 MED ORDER — AMLODIPINE BESYLATE 5 MG PO TABS: 5.0000 mg | ORAL_TABLET | Freq: Every day | ORAL | 3 refills | Status: DC

## 2019-11-25 NOTE — Progress Notes (Signed)
Subjective:    Patient ID: [name], male    DOB: 09-14-1933, 86 y.o.   MRN: [id number]  HPI   Pain Inventory Average Pain 3 Pain Right Now 0 My pain is sharp  Pain is in his shoulders and right hip and his left thumb (hx of being broken)  In the last 24 hours, has pain interfered with the following? General activity 1 Relation with others 0 Enjoyment of life 0 What TIME of day is your pain at its worst? evening Sleep (in general) Good  Pain is worse with: some activites Pain improves with: heat/ice Relief from Meds: na  Family History  Problem Relation Age of Onset  . Stomach cancer Mother   . Heart disease Father   . Heart disease Brother   . Aortic aneurysm Brother   . Colon cancer Brother   . Coronary artery disease Father 52  . Coronary artery disease Brother   . Aortic aneurysm Brother   . Colon cancer Brother   . Esophageal cancer Neg Hx   . Rectal cancer Neg Hx   . Arthritis Daughter        rheumatoid   Social History   Socioeconomic History  . Marital status: Married    Spouse name: [name]  . Number of children: 1  . Years of education: Not on file  . Highest education level: Not on file  Occupational History  . Occupation: retired professor    Comment: History   . Occupation: missionary  Tobacco Use  . Smoking status: Never Smoker  . Smokeless tobacco: Never Used  . Tobacco comment: quit over 15yrs ago.  Substance and Sexual Activity  . Alcohol use: Never  . Drug use: Never  . Sexual activity: Yes    Partners: Female  Other Topics Concern  . Not on file  Social History Narrative   ** Merged History Encounter **       Lives with his wife (second marriage in 2015, first marriage ended when his wife died alzheimer's disease). Since his remarriage, his adult daughter doesn't speak with him.   Social Determinants of Health   Financial Resource Strain:   . Difficulty of Paying Living Expenses: Not on file  Food Insecurity:   .  Worried About Charity fundraiser in the Last Year: Not on file  . Ran Out of Food in the Last Year: Not on file  Transportation Needs:   . Lack of Transportation (Medical): Not on file  . Lack of Transportation (Non-Medical): Not on file  Physical Activity:   . Days of Exercise per Week: Not on file  . Minutes of Exercise per Session: Not on file  Stress:   . Feeling of Stress : Not on file  Social Connections:   . Frequency of Communication with Friends and Family: Not on file  . Frequency of Social Gatherings with Friends and Family: Not on file  . Attends Religious Services: Not on file  . Active Member of Clubs or Organizations: Not on file  . Attends Archivist Meetings: Not on file  . Marital Status: Not on file   Past Surgical History:  Procedure Laterality Date  . APPENDECTOMY    . CARDIAC CATHETERIZATION  07/30/2006   CORONARY ANGIOPLASTY WITH STENT PLACEMENT  . CARDIAC CATHETERIZATION    . EXPLORATORY LAPAROTOMY     age 4  . EXPLORATORY LAPAROTOMY    . IR THORACENTESIS ASP PLEURAL SPACE W/IMG GUIDE  10/05/2019  Past Surgical History:  Procedure Laterality Date  . APPENDECTOMY    . CARDIAC CATHETERIZATION  07/30/2006   CORONARY ANGIOPLASTY WITH STENT PLACEMENT  . CARDIAC CATHETERIZATION    . EXPLORATORY LAPAROTOMY     age 73  . EXPLORATORY LAPAROTOMY    . IR THORACENTESIS ASP PLEURAL SPACE W/IMG GUIDE  10/05/2019   Past Medical History:  Diagnosis Date  . Allergy   . BPH (benign prostatic hyperplasia)   . BPH (benign prostatic hypertrophy)   . CAD (coronary artery disease)    s/p cypher DES to pLAD 6/08; normal LVF;  ETT-Myoview 2009: no ischemia   . Coronary artery disease   . Eosinophilia   . High cholesterol   . HTN (hypertension)   . Hyperlipidemia   . Hypertension   . MI (myocardial infarction) ([place])   . Myocardial infarction ([place])   . Prediabetes   . Trigeminal neuralgia    Temp 98.2 F (36.8 C)   Ht 5' 6.75" (1.695 m)   BMI 28.75  kg/m   Opioid Risk Score:   Fall Risk Score:  `1  Depression screen PHQ 2/9  Depression screen [facility] 2/9 11/25/2019 09/23/2019 04/07/2018 03/18/2018 10/10/2017 05/06/2017 04/04/2017  Decreased Interest 0 1 1 2  0 0 0  Down, Depressed, Hopeless 0 0 0 1 0 0 0  PHQ - 2 Score 0 1 1 3  0 0 0  Altered sleeping - - 0 2 - - -  Tired, decreased energy - - 0 0 - - -  Change in appetite - - 0 0 - - -  Feeling bad or failure about yourself  - - 0 0 - - -  Trouble concentrating - - 0 0 - - -  Moving slowly or fidgety/restless - - 0 0 - - -  Suicidal thoughts - - 0 0 - - -  PHQ-9 Score - - 1 5 - - -  Difficult doing work/chores - - Not difficult at all Not difficult at all - - -  Some recent data might be hidden   Review of Systems  Constitutional: Negative.   HENT: Negative.   Eyes: Negative.   Respiratory: Negative.   Cardiovascular: Negative.   Gastrointestinal: Negative.   Endocrine: Negative.   Genitourinary: Negative.   Musculoskeletal: Positive for arthralgias.       Shoulders, right hip, and left thumb  Skin: Negative.   Allergic/Immunologic: Negative.   Neurological: Negative.   Hematological: Negative.   Psychiatric/Behavioral: Negative.   All other systems reviewed and are negative.      Objective:   Physical Exam        Assessment & Plan:

## 2019-11-25 NOTE — Telephone Encounter (Signed)
-----   Message from Sherren Mocha, MD sent at 11/24/2019 10:21 PM EDT ----- Recommend trial of amlodipine 5 mg daily. thanks

## 2019-11-25 NOTE — Progress Notes (Signed)
Subjective:    Patient ID: [name], male    DOB: [birth year], 86 y.o.   MRN: [id number]  HPI  Mrs. [name] is an 86 year old man who presents for hospital follow-up after traumatic brain injury.   He has been doing very well with his therapies. He asks about driving. He was told by SLP that he still has difficulties with attention.  His mother-in-law died from [place] thought she was vaccinated.   As night comes he asks the same questions and forgets things   Prior history: His wife is concerned that he naps 1-2 times per day in the afternoon. Sleeps well at night. He is getting up to urinate and has bedside commode. He does not get up to urinate as frequently.  He does has have night sweats at night. There was no evidence of infection.   Reviewed results of CT together.   Asks about return to driving.  Has increase in the shortness of breath. The bReo was forgotten for a few days. More labored breathing at night. Has asthma and he was off Breo for sometimes but has restarted. The fluid in his   His wife asks whether to get an MRI of his spone.  H is not using the incentive spirometer as much as he should. He has also been out of breath with therapy. He has been working very hard there.   ife is about to back to work part time. Her boss is understanding Not going to church. Got the vaccine both shots. Hopes to get booster. Don't knwo who is vacinated and who is.   No Echo in Chart. Has October follow-up in cardiology.   Wife did a two week round of prednisone which made it hard for to breath.    Pain Inventory Average Pain 3 Pain Right Now 3 My pain is intermittent and sharp  In the last 24 hours, has pain interfered with the following? General activity 4 Relation with others 3 Enjoyment of life 8 What TIME of day is your pain at its worst? evening Sleep (in general) Fair  Pain is worse with: some activites Pain improves with: heat/ice Relief from Meds:  0  Family History  Problem Relation Age of Onset  . Stomach cancer Mother   . Heart disease Father   . Heart disease Brother   . Aortic aneurysm Brother   . Colon cancer Brother   . Coronary artery disease Father 48  . Coronary artery disease Brother   . Aortic aneurysm Brother   . Colon cancer Brother   . Esophageal cancer Neg Hx   . Rectal cancer Neg Hx   . Arthritis Daughter        rheumatoid   Social History   Socioeconomic History  . Marital status: Married    Spouse name: [name]  . Number of children: 1  . Years of education: Not on file  . Highest education level: Not on file  Occupational History  . Occupation: retired professor    Comment: History   . Occupation: missionary  Tobacco Use  . Smoking status: Never Smoker  . Smokeless tobacco: Never Used  . Tobacco comment: quit over 75yrs ago.  Substance and Sexual Activity  . Alcohol use: Never  . Drug use: Never  . Sexual activity: Yes    Partners: Female  Other Topics Concern  . Not on file  Social History Narrative   ** Merged History Encounter **  Lives with his wife (second marriage in 2015, first marriage ended when his wife died alzheimer's disease). Since his remarriage, his adult daughter doesn't speak with him.   Social Determinants of Health   Financial Resource Strain:   . Difficulty of Paying Living Expenses: Not on file  Food Insecurity:   . Worried About Charity fundraiser in the Last Year: Not on file  . Ran Out of Food in the Last Year: Not on file  Transportation Needs:   . Lack of Transportation (Medical): Not on file  . Lack of Transportation (Non-Medical): Not on file  Physical Activity:   . Days of Exercise per Week: Not on file  . Minutes of Exercise per Session: Not on file  Stress:   . Feeling of Stress : Not on file  Social Connections:   . Frequency of Communication with Friends and Family: Not on file  . Frequency of Social Gatherings with Friends and Family: Not  on file  . Attends Religious Services: Not on file  . Active Member of Clubs or Organizations: Not on file  . Attends Archivist Meetings: Not on file  . Marital Status: Not on file   Past Surgical History:  Procedure Laterality Date  . APPENDECTOMY    . CARDIAC CATHETERIZATION  07/30/2006   CORONARY ANGIOPLASTY WITH STENT PLACEMENT  . CARDIAC CATHETERIZATION    . EXPLORATORY LAPAROTOMY     age 76  . EXPLORATORY LAPAROTOMY    . IR THORACENTESIS ASP PLEURAL SPACE W/IMG GUIDE  10/05/2019   Past Surgical History:  Procedure Laterality Date  . APPENDECTOMY    . CARDIAC CATHETERIZATION  07/30/2006   CORONARY ANGIOPLASTY WITH STENT PLACEMENT  . CARDIAC CATHETERIZATION    . EXPLORATORY LAPAROTOMY     age 30  . EXPLORATORY LAPAROTOMY    . IR THORACENTESIS ASP PLEURAL SPACE W/IMG GUIDE  10/05/2019   Past Medical History:  Diagnosis Date  . Allergy   . BPH (benign prostatic hyperplasia)   . BPH (benign prostatic hypertrophy)   . CAD (coronary artery disease)    s/p cypher DES to pLAD 6/08; normal LVF;  ETT-Myoview 2009: no ischemia   . Coronary artery disease   . Eosinophilia   . High cholesterol   . HTN (hypertension)   . Hyperlipidemia   . Hypertension   . MI (myocardial infarction) ([place])   . Myocardial infarction ([place])   . Prediabetes   . Trigeminal neuralgia    Temp 98.2 F (36.8 C)   Ht 5' 6.75" (1.695 m)   BMI 28.75 kg/m   Opioid Risk Score:   Fall Risk Score:  `1  Depression screen PHQ 2/9  Depression screen [facility] 2/9 09/23/2019 04/07/2018 03/18/2018 10/10/2017 05/06/2017 04/04/2017 04/02/2016  Decreased Interest 1 1 2  0 0 0 0  Down, Depressed, Hopeless 0 0 1 0 0 0 0  PHQ - 2 Score 1 1 3  0 0 0 0  Altered sleeping - 0 2 - - - -  Tired, decreased energy - 0 0 - - - -  Change in appetite - 0 0 - - - -  Feeling bad or failure about yourself  - 0 0 - - - -  Trouble concentrating - 0 0 - - - -  Moving slowly or fidgety/restless - 0 0 - - - -  Suicidal thoughts - 0  0 - - - -  PHQ-9 Score - 1 5 - - - -  Difficult doing work/chores -  Not difficult at all Not difficult at all - - - -  Some recent data might be hidden   Review of Systems  Musculoskeletal: Positive for gait problem.  Neurological: Positive for weakness.  All other systems reviewed and are negative.     Objective:   Physical Exam Gen: no distress, normal appearing HEENT: oral mucosa pink and moist, NCAT Cardio: Reg rate Chest: normal effort, normal rate of breathing Abd: soft, non-distended Ext: no edema Skin: intact Neuro: Alert and oriented x3 Musculoskeletal: 5/5 strength throughout. Aox3. Can spell WORLD backwards.  Psych: pleasant, normal affect    Assessment & Plan:  [name] is an 86 year old man who presents for hospital follow-up of TBI.  1) Impaired mobility and ADLs -Continue HEP -mobility is much improved.   2) Cognitive deficits -much improved -discussed good foods to eat. -Can continue Prevagen.   3) Shortness of breath:  -Improved after drianing.   4) Napping during the day -Advised that naps are ok as long as he is sleeping well at night. Can be very refreshing.  -Has lessened  5) Hypertension -BP elevated. Dr. [name] has sent a BP med for him.   6) Driving: RETURN TO DRIVING PLAN:  WITH THE SUPERVISION OF A LICENSED DRIVER, PLEASE DRIVE IN AN EMPTY PARKING LOT FOR AT LEAST 2-3 TRIALS TO TEST REACTION TIME, VISION, USE OF EQUIPMENT IN CAR, ETC.  IF SUCCESSFUL WITH THE PARKING LOT DRIVING, PROCEED TO SUPERVISED DRIVING TRIALS IN YOUR NEIGHBORHOOD STREETS AT LOW TRAFFIC TIMES TO TEST OBSERVATION TO TRAFFIC SIGNALS, REACTION TIME, ETC. PLEASE ATTEMPT AT LEAST 2-3 TRIALS IN YOUR NEIGHBORHOOD.  IF NEIGHBORHOOD DRIVING IS SUCCESSFUL, YOU MAY PROCEED TO DRIVING IN BUSIER AREAS IN YOUR COMMUNITY WITH SUPERVISION OF A LICENSED DRIVER. PLEASE ATTEMPT AT LEAST 4-5 TRIALS.  IF COMMUNITY DRIVING IS SUCCESSFUL, YOU MAY PROCEED TO DRIVING ALONE, DURING THE  DAY TIME, PLEASE DO NOT DRIVE IF YOU FEEL FATIGUED OR UNDER THE INFLUENCE OF MEDICATION.   -Discussed driving school  7) Would be interested in support group for TBI once this is available.   8) DM type 2: 6/9 hgbA1c is 6.5- repeat today.   9) Trigeminal neuralgia: Discussed that ethmoid sinus congestion could be be contributing. Continue heating pad which helps and start steam inhalation.   40 minutes spent in discussion of driving, shortness of breath, stress, impaired mobility and ADLs, COVID booster, return to church, HTN, napping during the day, trigeminal neuralgia, importance of socialization, reviewing CT results, discussing anti-inflammatory diet.

## 2019-11-25 NOTE — Telephone Encounter (Signed)
Instructed patient to START AMLODIPINE 5 mg daily. He will keep a log and bring with him to appointment with Richardson Dopp at the end of November. He was grateful for call and agrees with treatment plan.

## 2019-11-28 ENCOUNTER — Other Ambulatory Visit: Payer: Self-pay | Admitting: Family Medicine

## 2019-11-28 ENCOUNTER — Other Ambulatory Visit: Payer: Self-pay | Admitting: Cardiovascular Disease

## 2019-11-30 ENCOUNTER — Other Ambulatory Visit: Payer: Self-pay | Admitting: Physical Medicine and Rehabilitation

## 2019-12-01 LAB — CHOLESTEROL, BODY FLUID: Cholesterol, Fluid: 69 mg/dL

## 2019-12-01 LAB — PH, BODY FLUID: pH, Body Fluid: 7.8

## 2019-12-01 LAB — TRIGLYCERIDES, BODY FLUIDS: Triglycerides, Fluid: 17 mg/dL

## 2019-12-01 LAB — LD, BODY FLUID (OTHER): LD, Body Fluid: 119 IU/L

## 2019-12-01 LAB — GLUCOSE, BODY FLUID OTHER: Glucose, Fluid: 121 mg/dL

## 2019-12-01 LAB — AMYLASE, BODY FLUID (OTHER): Amylase, Body Fluid: 72 U/L

## 2019-12-01 LAB — PROTEIN, BODY FLUID (OTHER): Protein, Fluid: 3.9 g/dL

## 2019-12-18 ENCOUNTER — Telehealth: Payer: Self-pay | Admitting: *Deleted

## 2019-12-18 NOTE — Telephone Encounter (Signed)
Calling patient's wife now

## 2019-12-18 NOTE — Telephone Encounter (Signed)
Patients wife called our office worried.  She says Clearence told her that he had fallen when he was home alone.   She says that patient  Initially reported that there was some muscoskeletal soreness from the fall. However, when she touched the soft spot under the occipital prominence on the back of the skull, patient reported tenderness, indicating that he must of hit his head as well.  Patients wife is worried.  I told her the office was virtually empty at this point.  I advised continued home monitoring. If symptoms present that seem unusual, I advised EMS, ED visit or Urgent care depending on severity.   Patient's wife asked If I could please send a message to Dr. Ranell Patrick.

## 2019-12-22 ENCOUNTER — Other Ambulatory Visit: Payer: Self-pay | Admitting: Family Medicine

## 2019-12-24 ENCOUNTER — Other Ambulatory Visit: Payer: Self-pay

## 2019-12-24 ENCOUNTER — Emergency Department (HOSPITAL_COMMUNITY): Payer: PPO

## 2019-12-24 ENCOUNTER — Encounter (HOSPITAL_COMMUNITY): Payer: Self-pay | Admitting: Emergency Medicine

## 2019-12-24 ENCOUNTER — Emergency Department (HOSPITAL_COMMUNITY)
Admission: EM | Admit: 2019-12-24 | Discharge: 2019-12-24 | Disposition: A | Discharge status: Home / Self Care | Payer: PPO | Attending: Emergency Medicine | Admitting: Emergency Medicine

## 2019-12-24 DIAGNOSIS — M25519 Pain in unspecified shoulder: Secondary | ICD-10-CM | POA: Diagnosis not present

## 2019-12-24 DIAGNOSIS — Z79899 Other long term (current) drug therapy: Secondary | ICD-10-CM | POA: Diagnosis not present

## 2019-12-24 DIAGNOSIS — R52 Pain, unspecified: Secondary | ICD-10-CM | POA: Diagnosis not present

## 2019-12-24 DIAGNOSIS — S40011A Contusion of right shoulder, initial encounter: Secondary | ICD-10-CM | POA: Diagnosis not present

## 2019-12-24 DIAGNOSIS — W19XXXA Unspecified fall, initial encounter: Secondary | ICD-10-CM | POA: Insufficient documentation

## 2019-12-24 DIAGNOSIS — I2510 Atherosclerotic heart disease of native coronary artery without angina pectoris: Secondary | ICD-10-CM | POA: Diagnosis not present

## 2019-12-24 DIAGNOSIS — Z955 Presence of coronary angioplasty implant and graft: Secondary | ICD-10-CM | POA: Diagnosis not present

## 2019-12-24 DIAGNOSIS — S199XXA Unspecified injury of neck, initial encounter: Secondary | ICD-10-CM | POA: Diagnosis not present

## 2019-12-24 DIAGNOSIS — M19011 Primary osteoarthritis, right shoulder: Secondary | ICD-10-CM | POA: Diagnosis not present

## 2019-12-24 DIAGNOSIS — J3489 Other specified disorders of nose and nasal sinuses: Secondary | ICD-10-CM | POA: Diagnosis not present

## 2019-12-24 DIAGNOSIS — S0003XA Contusion of scalp, initial encounter: Secondary | ICD-10-CM | POA: Diagnosis not present

## 2019-12-24 DIAGNOSIS — I129 Hypertensive chronic kidney disease with stage 1 through stage 4 chronic kidney disease, or unspecified chronic kidney disease: Secondary | ICD-10-CM | POA: Insufficient documentation

## 2019-12-24 DIAGNOSIS — R000 Tachycardia, unspecified: Secondary | ICD-10-CM | POA: Diagnosis not present

## 2019-12-24 DIAGNOSIS — E1122 Type 2 diabetes mellitus with diabetic chronic kidney disease: Secondary | ICD-10-CM | POA: Diagnosis not present

## 2019-12-24 DIAGNOSIS — I10 Essential (primary) hypertension: Secondary | ICD-10-CM | POA: Diagnosis not present

## 2019-12-24 DIAGNOSIS — J90 Pleural effusion, not elsewhere classified: Secondary | ICD-10-CM | POA: Diagnosis not present

## 2019-12-24 DIAGNOSIS — M4312 Spondylolisthesis, cervical region: Secondary | ICD-10-CM | POA: Diagnosis not present

## 2019-12-24 DIAGNOSIS — N1832 Chronic kidney disease, stage 3b: Secondary | ICD-10-CM | POA: Insufficient documentation

## 2019-12-24 DIAGNOSIS — S40211A Abrasion of right shoulder, initial encounter: Secondary | ICD-10-CM | POA: Diagnosis not present

## 2019-12-24 DIAGNOSIS — G319 Degenerative disease of nervous system, unspecified: Secondary | ICD-10-CM | POA: Diagnosis not present

## 2019-12-24 DIAGNOSIS — J0100 Acute maxillary sinusitis, unspecified: Secondary | ICD-10-CM | POA: Diagnosis not present

## 2019-12-24 DIAGNOSIS — Y92009 Unspecified place in unspecified non-institutional (private) residence as the place of occurrence of the external cause: Secondary | ICD-10-CM | POA: Diagnosis not present

## 2019-12-24 DIAGNOSIS — S0990XA Unspecified injury of head, initial encounter: Secondary | ICD-10-CM | POA: Diagnosis not present

## 2019-12-24 IMAGING — DX DG SHOULDER 2+V*R*
3 series · 3 of 3 positions shown · non-contrast
Comparison: Chest x-ray [DATE].  CT chest [DATE].

CLINICAL DATA: Pain after fall.

EXAM:
RIGHT SHOULDER - 2+ VIEW

[shoulder grashey]
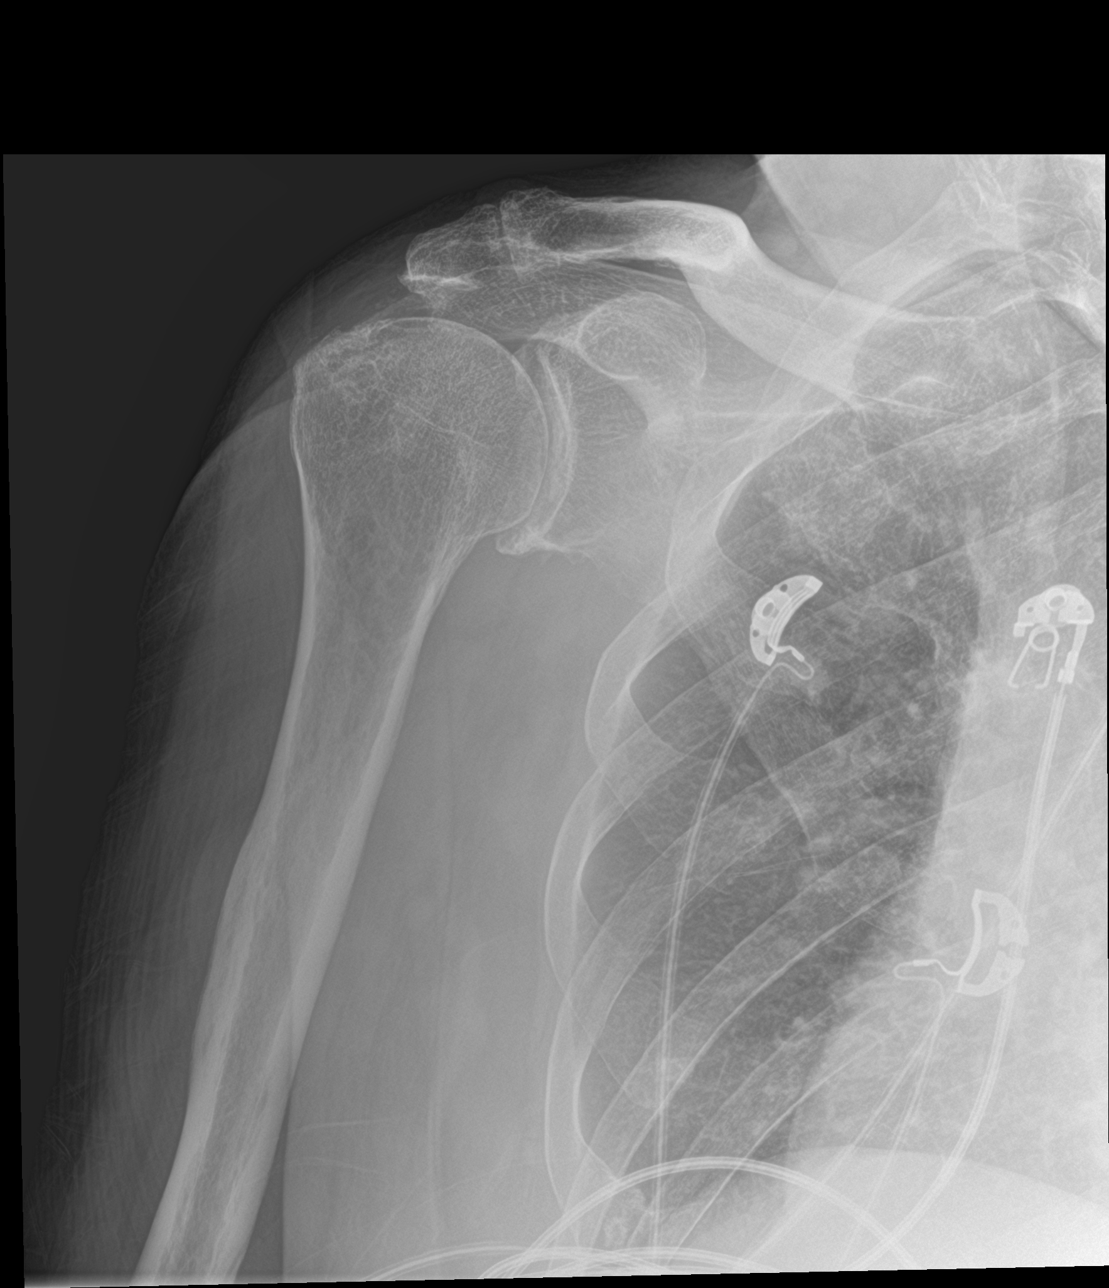

[shoulder y view]
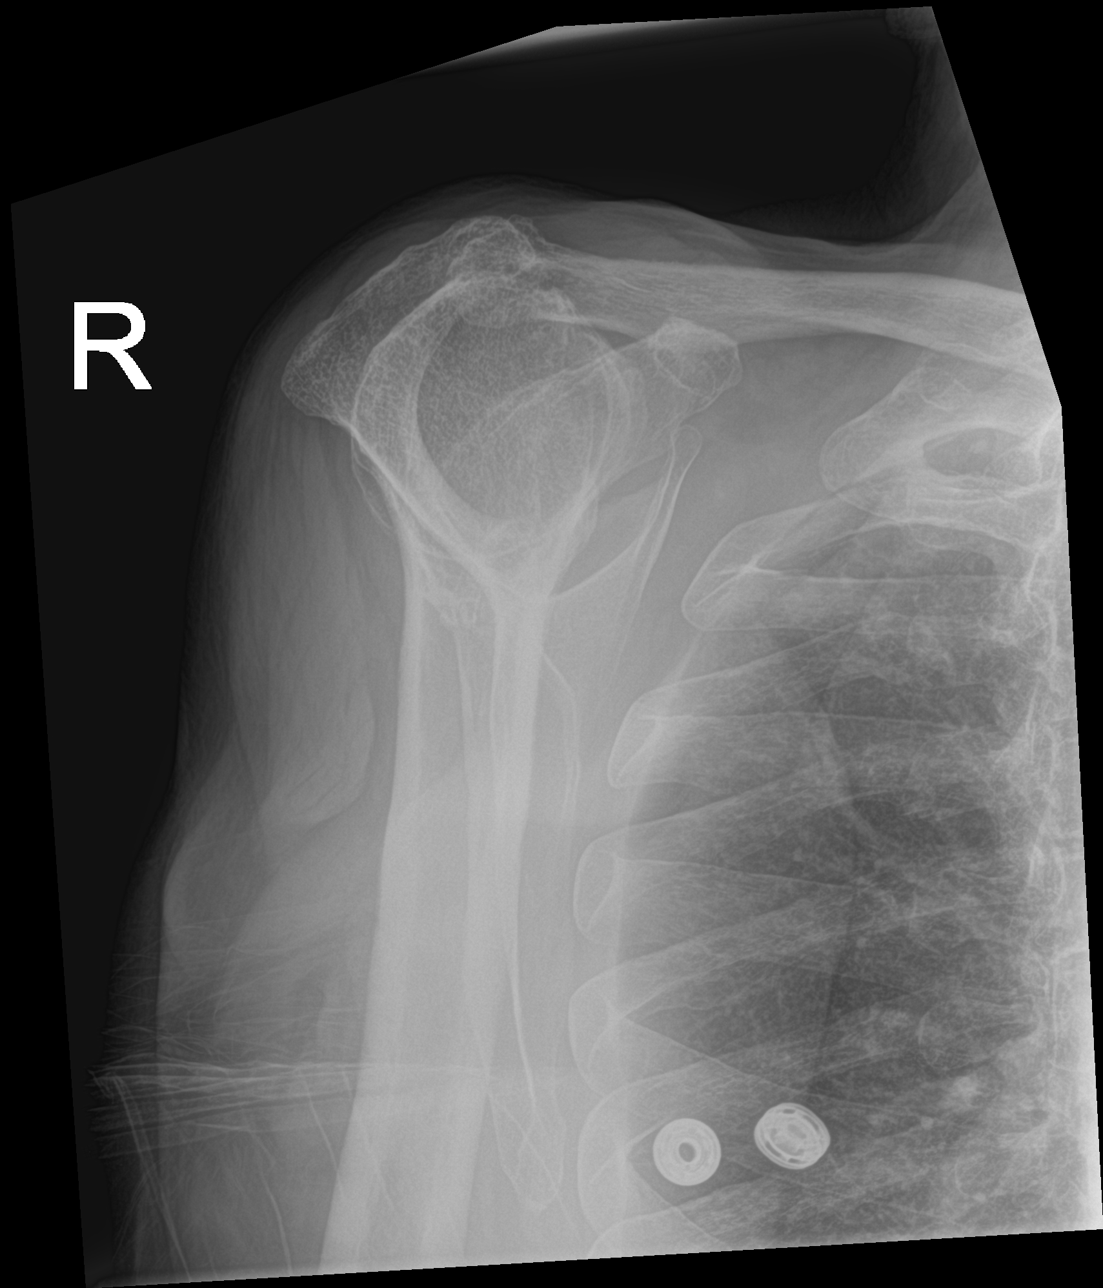

[shoulder ap neutral]
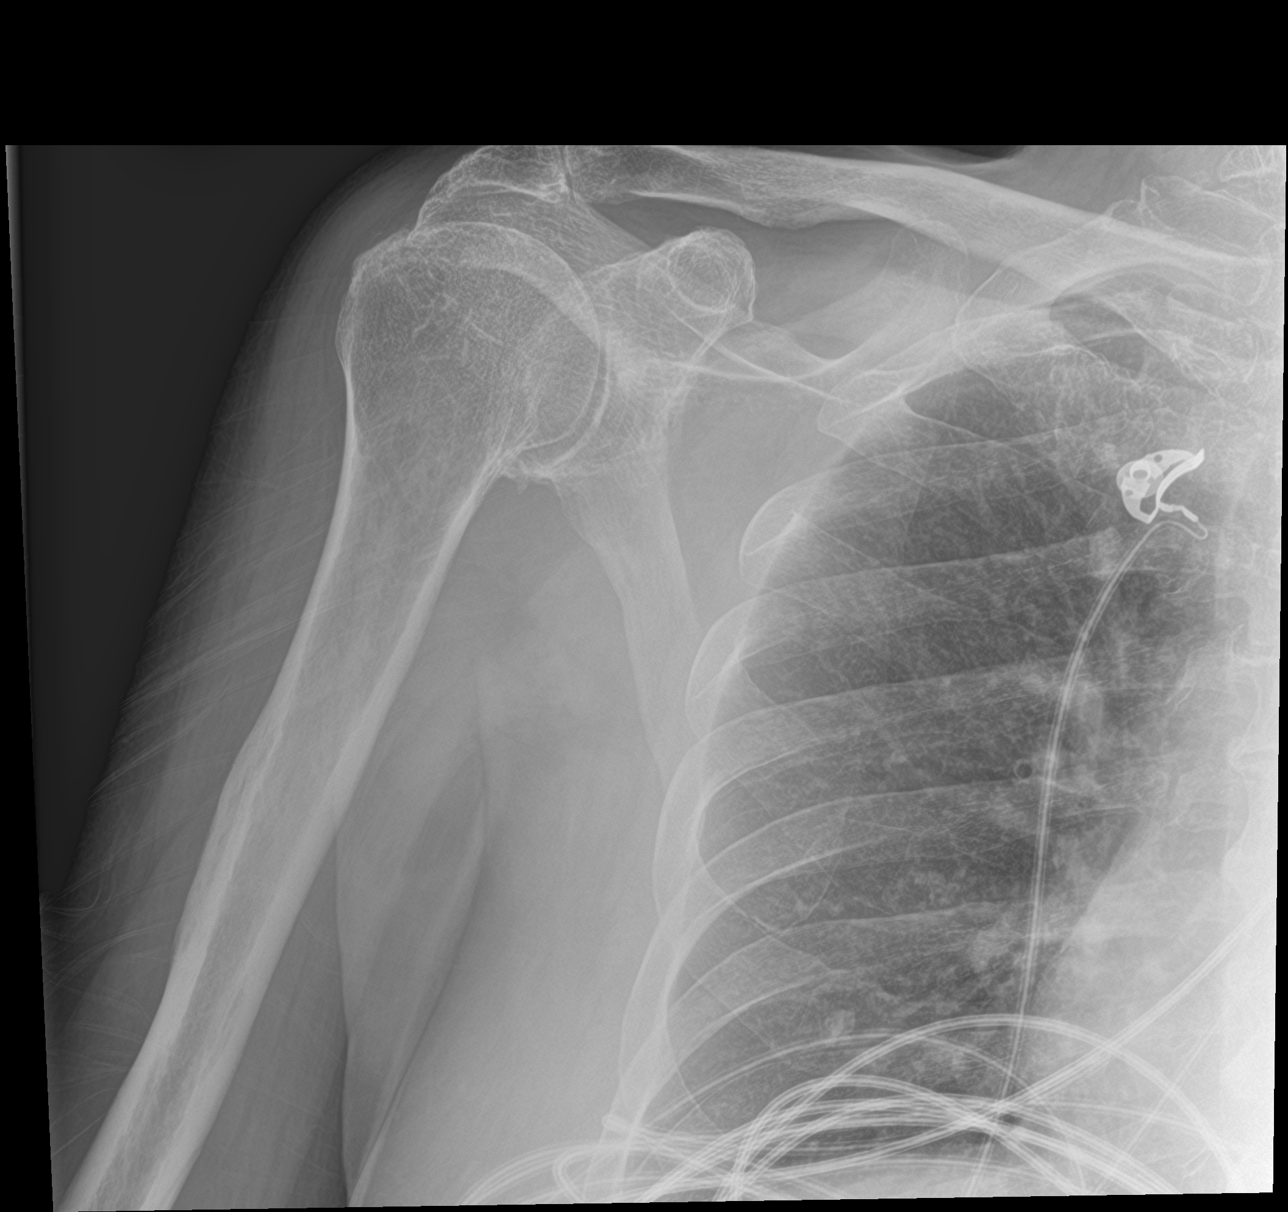

[3 of 3 positions shown; findings below may reference images not displayed]

FINDINGS: Acromioclavicular glenohumeral degenerative change. Small loose body
may be present. No evidence of fracture or dislocation bone island
again noted the right anterior fifth rib. Scratch
IMPRESSION: Acromioclavicular and glenohumeral degenerative change. Small loose
body may be present. No acute abnormality identified.

## 2019-12-24 IMAGING — CT CT HEAD W/O CM
3 series · 14 of 47 positions shown, 16 images · non-contrast
Comparison: Head CT [DATE].  Cervical spine MRI [DATE].

CLINICAL DATA: Head trauma, minor. Neck trauma. Additional history
provided: Fall.

EXAM:
CT HEAD WITHOUT CONTRAST
CT CERVICAL SPINE WITHOUT CONTRAST
TECHNIQUE: Multidetector CT imaging of the head and cervical spine was
performed following the standard protocol without intravenous
contrast. Multiplanar CT image reconstructions of the cervical spine
were also generated.

[Series 1: head 5.0 h30s · axial · 0.51mm/px · z∈[-176,-31]mm · 8 of 35 slices shown, 10 images]
[im 3/35  brain]
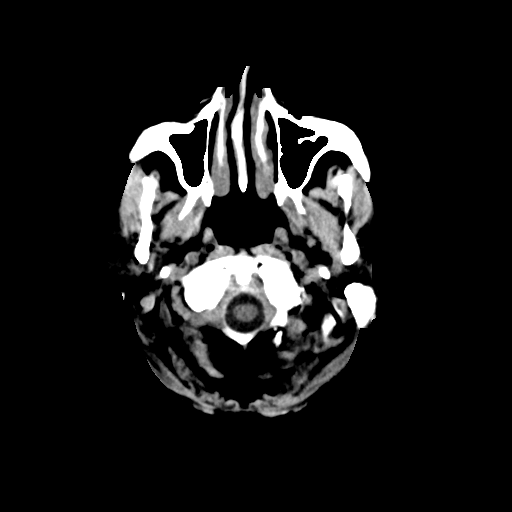
[im 3/35  bone]
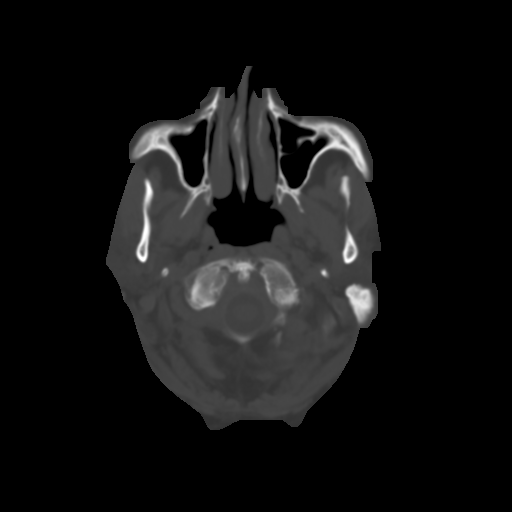
[im 8/35  brain]
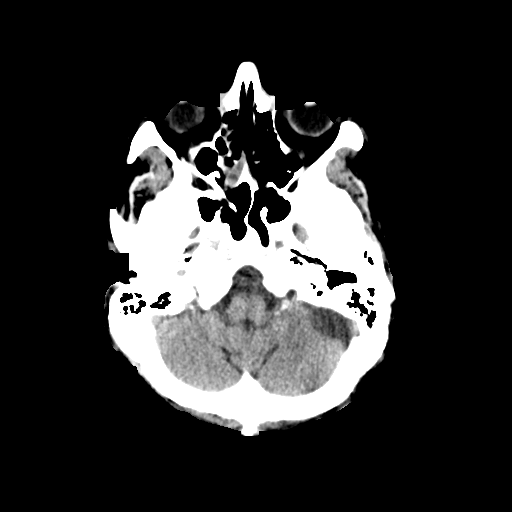
[im 11/35  brain]
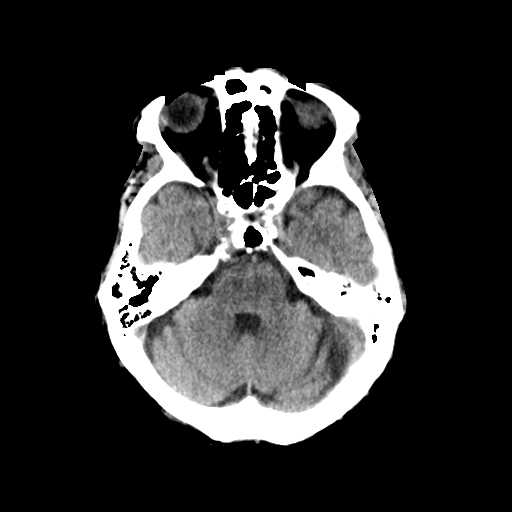
[im 16/35  brain]
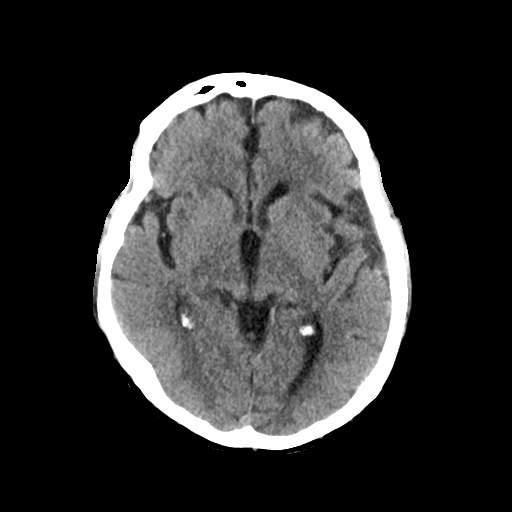
[im 19/35  brain]
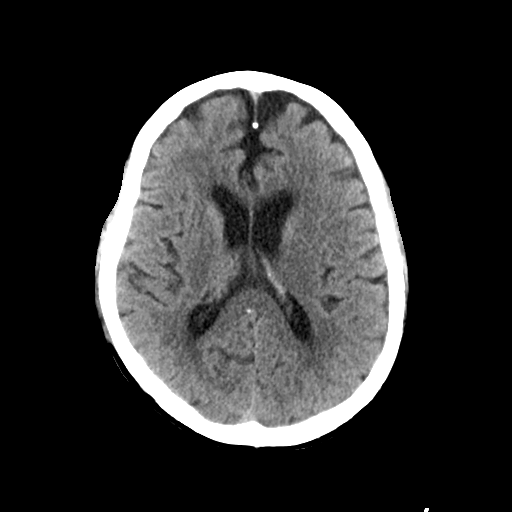
[im 19/35  bone]
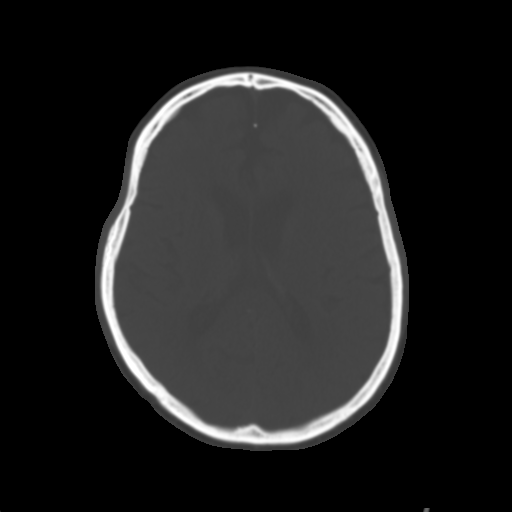
[im 24/35  brain]
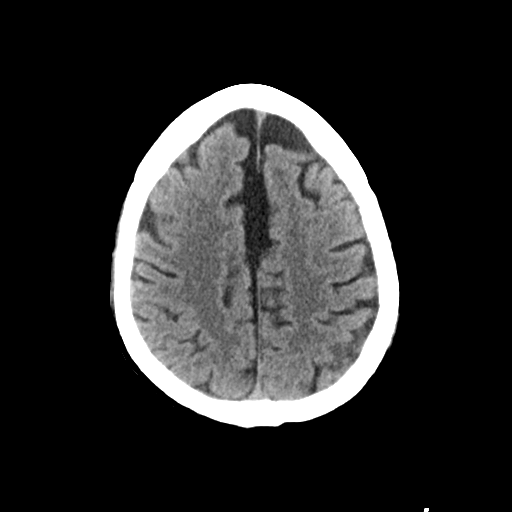
[im 27/35  brain]
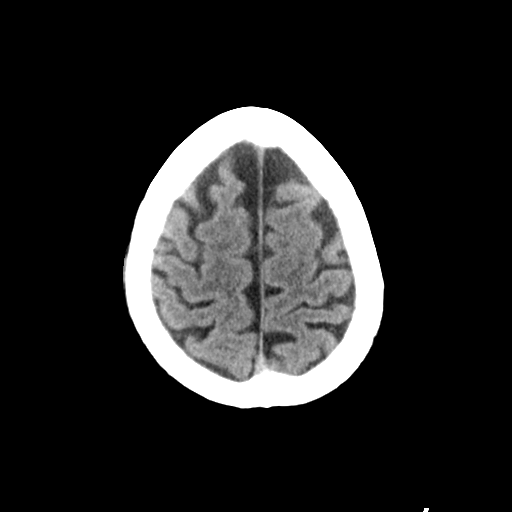
[im 32/35  brain]
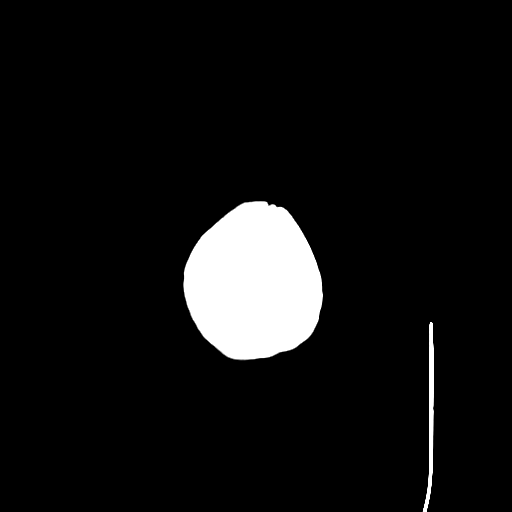

[Series 5: head 3.0 mpr cor · coronal · 0.33mm/px · 3 of 67 slices shown]
[im 23/67  brain]
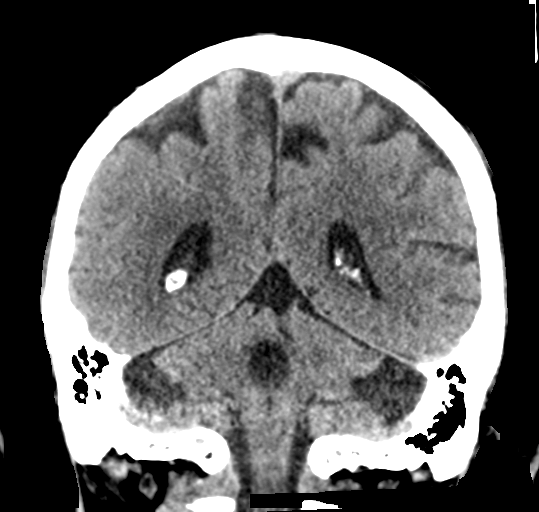
[im 30/67  brain]
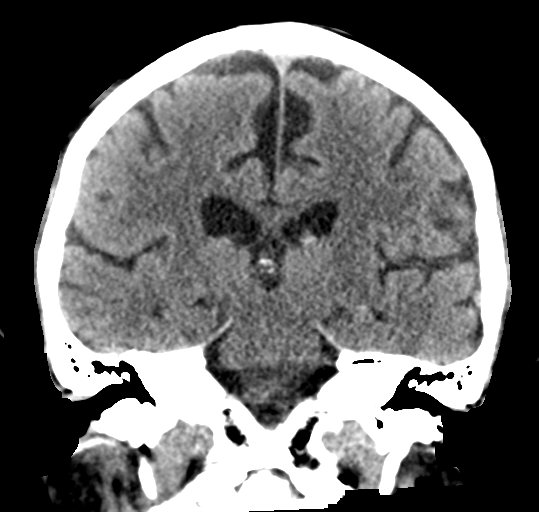
[im 37/67  brain]
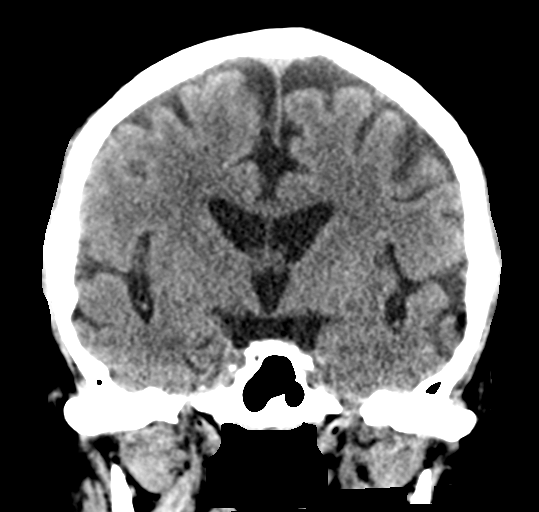

[Series 6: head 3.0 mpr sag · sagittal · 0.33mm/px · 3 of 58 slices shown]
[im 20/58  brain]
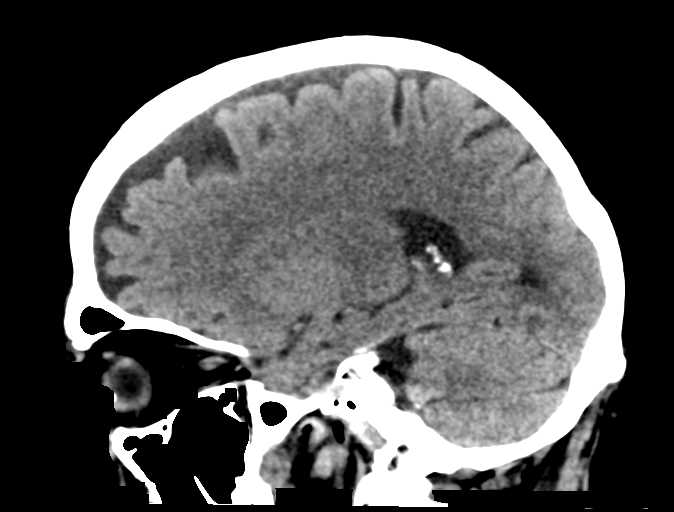
[im 29/58  brain]
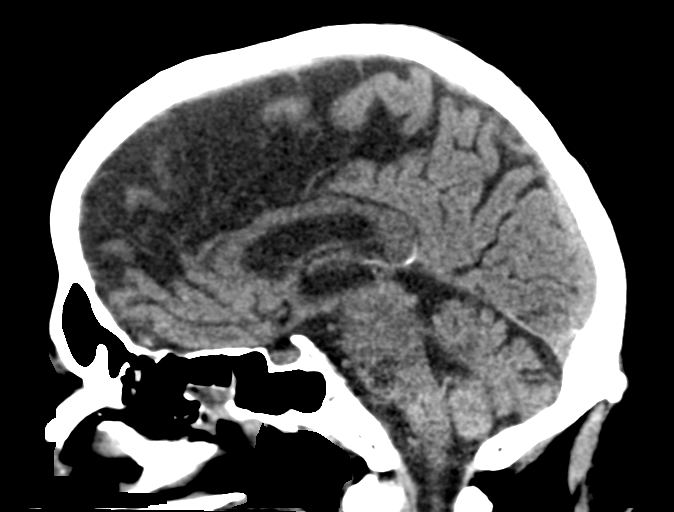
[im 39/58  brain]
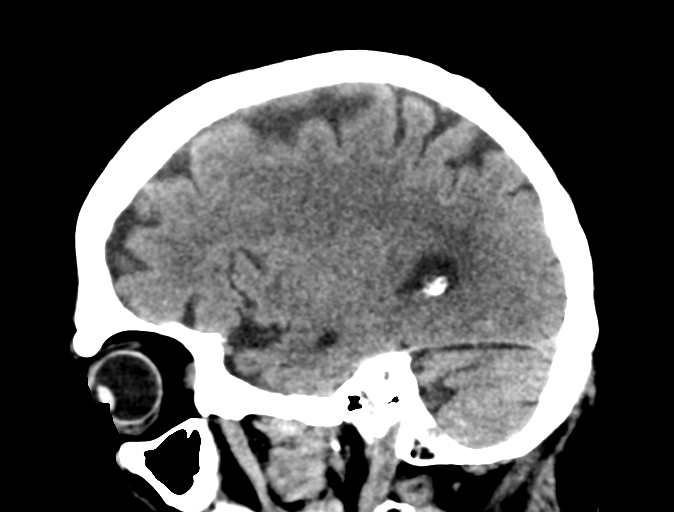

[14 of 47 positions shown; findings below may reference images not displayed]

FINDINGS: CT HEAD FINDINGS

Brain:

Mild generalized cerebral atrophy.

There is no acute intracranial hemorrhage.

No demarcated cortical infarct.

No extra-axial fluid collection.

No evidence of intracranial mass.

No midline shift.

Vascular: No hyperdense vessel.  Atherosclerotic calcifications.

Skull: Normal. Negative for fracture or focal lesion.

Sinuses/Orbits: Visualized orbits show no acute finding. Mild
ethmoid, sphenoid and maxillary sinus mucosal thickening. Partial
opacification of the right nasal passage.

Other: Right parietal scalp soft tissue swelling.

CT CERVICAL SPINE FINDINGS

Alignment: Trace grade 1 anterolisthesis at C2-C3, C3-C4, C5-C6 and
C6-C7

Skull base and vertebrae: The basion-dental and atlanto-dental
intervals are maintained.No evidence of acute fracture to the
cervical spine. Subtle rounded deformity of the anterior C7 superior
endplate appears degenerative.

Soft tissues and spinal canal: No prevertebral fluid or swelling. No
visible canal hematoma.

Disc levels: Cervical spondylosis with multilevel disc space
narrowing, disc bulges, uncovertebral hypertrophy and facet
arthrosis.

Upper chest: Partially imaged left pleural effusion. No visible
airspace consolidation or pneumothorax.

Other: Subcentimeter thyroid nodules not meeting consensus criteria
for ultrasound follow-up. Atherosclerotic calcifications within the
proximal major branch vessels of the neck, distal common carotid
arteries, carotid bifurcations and proximal ICAs.
IMPRESSION: CT head:

1. No evidence of acute intracranial abnormality.
2. Right parietal scalp soft tissue swelling.
3. Mild cerebral atrophy.
4. Mild ethmoid, sphenoid and maxillary sinus mucosal thickening.
5. Nonspecific partial opacification of the right nasal passage.

CT cervical spine:

1. No evidence of acute fracture to the cervical spine.
2. Trace grade 1 anterolisthesis at C2-C3, C3-C4, C5-C6 and C6-C7.
3. Cervical spondylosis as described.
4. Partially imaged left pleural effusion.
5. Atherosclerotic disease.

## 2019-12-24 MED ORDER — ACETAMINOPHEN 500 MG PO TABS: 1000.0000 mg | ORAL_TABLET | Freq: Once | ORAL | Status: DC

## 2019-12-24 NOTE — Discharge Instructions (Signed)
There was no internal bleeding on the CAT scan today of your head.  The x-ray of the shoulder did not show any signs of broken bones.  However over the next few days to few weeks if you start having difficulty walking, confusion, repetitive nausea and vomiting or one-sided weakness you should return to the emergency room immediately for repeat CAT scan.  You can use Tylenol as needed for pain.

## 2019-12-24 NOTE — ED Notes (Signed)
Pt discharge instructions and follow-up care reviewed with the patient. The patient verbalized understanding of instructions. Pt discharged. 

## 2019-12-24 NOTE — ED Triage Notes (Signed)
Patient arrives to ED with [facility] with complaints of fall at home. Per EMS patient was walking up stairs to house, missed a step and fell. Pt hit his head and right shoulder on brick wall of house. No thinners, no LOC.

## 2019-12-24 NOTE — ED Provider Notes (Signed)
[facility] EMERGENCY DEPARTMENT Provider Note   CSN: [id number] Arrival date & time: 12/24/19  1333     History Chief Complaint  Patient presents with  . Fall    [name] is a 86 y.o. male.  Patient is an 86 year old male with a history of hypertension, coronary artery disease, recent traumatic brain injury, intracranial hemorrhage requiring surgery, left-sided rib fractures and clavicle fracture who reports that he had just been cleared after doing physical therapy and was no longer needing an assist device for ambulating who presents today after a fall.  Patient reports that he missed a step when he was walking that caused him to fall to the right side.  He reports a bush helped break his fall but the right side of his head hit the foundation of the house.  He denies loss of consciousness but does have a mild headache.  No visual changes.  He is also having pain in his right shoulder.  Initially he states he had some pain in his back in the coccyx area but when EMS arrived he was able to stand and ambulate on his own.  He denies any abdominal pain or rib pain.  No shortness of breath.  No numbness or weakness in his legs.  Patient does not take anticoagulation.  The history is provided by the patient and the spouse.  Fall This is a new problem. The current episode started 1 to 2 hours ago. The problem occurs constantly. The problem has not changed since onset.Associated symptoms include headaches. Associated symptoms comments: Right shoulder pain. The symptoms are aggravated by bending and twisting. The symptoms are relieved by rest. He has tried nothing for the symptoms.       Past Medical History:  Diagnosis Date  . Allergy   . BPH (benign prostatic hyperplasia)   . BPH (benign prostatic hypertrophy)   . CAD (coronary artery disease)    s/p cypher DES to pLAD 6/08; normal LVF;  ETT-Myoview 2009: no ischemia   . Coronary artery disease   .  Eosinophilia   . High cholesterol   . HTN (hypertension)   . Hyperlipidemia   . Hypertension   . MI (myocardial infarction) ([place])   . Myocardial infarction ([place])   . Prediabetes   . Trigeminal neuralgia     Patient Active Problem List   Diagnosis Date Noted  . Fracture of multiple ribs with pain 08/10/2019  . Clavicle fracture 08/10/2019  . Closed displaced fracture of phalanx of left thumb, sequela 08/10/2019  . TBI (traumatic brain injury) ([place]) 07/23/2019  . Traumatic closed fracture of distal clavicle with minimal displacement, left, initial encounter 07/19/2019  . Traumatic brain injury with loss of consciousness ([place])   . Multiple trauma   . Benign prostatic hyperplasia   . Essential hypertension   . AKI (acute kidney injury) ([place])   . Stage 3b chronic kidney disease ([place])   . Prediabetes   . Acute blood loss anemia   . Thrombocytopenia ([place])   . Fall 07/14/2019  . Type 2 diabetes mellitus ([place])   . Eosinophilia   . Need for prophylactic vaccination and inoculation against influenza 11/12/2013  . Nocturnal leg cramps 11/23/2012  . Pain in joint, shoulder region 11/23/2012  . Fall 11/04/2012  . Hematoma 11/04/2012  . Acute upper respiratory infections of unspecified site 11/04/2012  . HYPERLIPIDEMIA TYPE IIB / III 03/08/2008  . Orthostatic hypotension 03/08/2008  . CAD, NATIVE VESSEL 03/08/2008  Past Surgical History:  Procedure Laterality Date  . APPENDECTOMY    . CARDIAC CATHETERIZATION  07/30/2006   CORONARY ANGIOPLASTY WITH STENT PLACEMENT  . CARDIAC CATHETERIZATION    . EXPLORATORY LAPAROTOMY     age 51  . EXPLORATORY LAPAROTOMY    . IR THORACENTESIS ASP PLEURAL SPACE W/IMG GUIDE  10/05/2019       Family History  Problem Relation Age of Onset  . Stomach cancer Mother   . Heart disease Father   . Heart disease Brother   . Aortic aneurysm Brother   . Colon cancer Brother   . Coronary artery disease Father 38  . Coronary artery disease Brother     . Aortic aneurysm Brother   . Colon cancer Brother   . Esophageal cancer Neg Hx   . Rectal cancer Neg Hx   . Arthritis Daughter        rheumatoid    Social History   Tobacco Use  . Smoking status: Never Smoker  . Smokeless tobacco: Never Used  . Tobacco comment: quit over 44yrs ago.  Substance Use Topics  . Alcohol use: Never  . Drug use: Never    Home Medications Prior to Admission medications   Medication Sig Start Date End Date Taking? Authorizing Provider  acetaminophen (TYLENOL) 500 MG tablet Take 1,000 mg by mouth every 6 (six) hours as needed for moderate pain.    [provider]  albuterol (PROAIR HFA) 108 (90 Base) MCG/ACT inhaler INHALE 2 PUFFS INTO THE LUNGS EVERY 6 HOURS AS NEEDED FOR WHEEZING OR SHORTNESS OF BREATH Patient taking differently: Inhale 2 puffs into the lungs every 6 (six) hours as needed for wheezing or shortness of breath. INHALE 2 PUFFS INTO THE LUNGS EVERY 6 HOURS AS NEEDED FOR WHEEZING OR SHORTNESS OF BREATH 08/06/19   Angiulli, Lavon Paganini, PA-C  amLODipine (NORVASC) 5 MG tablet Take 1 tablet (5 mg total) by mouth daily. Patient not taking: Reported on 11/25/2019 11/25/19 11/19/20  Sherren Mocha, MD  Apoaequorin (PREVAGEN) 10 MG CAPS Take 10 mg by mouth daily.    [provider]  atorvastatin (LIPITOR) 40 MG tablet Take 40 mg by mouth daily.    [provider]  Adair Patter [phone] MCG/INH AEPB INHALE 1 PUFF BY MOUTH EVERY DAY 12/22/19   [name], MD  Calcium-Vitamin D 600-200 MG-UNIT tablet Take 1 tablet by mouth 2 (two) times daily. Patient taking differently: Take 1 tablet by mouth daily.  08/06/19   Angiulli, Lavon Paganini, PA-C  doxazosin (CARDURA) 2 MG tablet TAKE 1 TABLET BY MOUTH EVERY DAY 11/30/19   [name], MD  escitalopram (LEXAPRO) 10 MG tablet TAKE 1 TABLET BY MOUTH EVERY DAY Patient taking differently: Take 10 mg by mouth daily.  09/15/19   [name], MD  ezetimibe (ZETIA) 10 MG tablet TAKE 1  TABLET BY MOUTH EVERY DAY 11/30/19   Sherren Mocha, MD  loratadine (CLARITIN) 10 MG tablet Take by mouth.    [provider]  Multiple Vitamin (MULTIVITAMIN WITH MINERALS) TABS tablet Take 1 tablet by mouth daily.    [provider]  naproxen (NAPROSYN) 500 MG tablet Take 500 mg by mouth 2 (two) times daily. Patient not taking: Reported on 11/25/2019 10/13/19   [provider]    Allergies    Gabapentin  Review of Systems   Review of Systems  Neurological: Positive for headaches.  All other systems reviewed and are negative.   Physical Exam Updated Vital Signs  BP (!) 137/57   Pulse 76   Temp 97.6 F (36.4 C) (Oral)   Resp 14   Ht 5\' 7"  (1.702 m)   Wt 78.5 kg   SpO2 97%   BMI 27.10 kg/m   Physical Exam Vitals and nursing note reviewed.  Constitutional:      General: He is not in acute distress.    Appearance: Normal appearance. He is well-developed and normal weight.  HENT:     Head: Normocephalic. Contusion present.   Eyes:     Conjunctiva/sclera: Conjunctivae normal.     Pupils: Pupils are equal, round, and reactive to light.  Cardiovascular:     Rate and Rhythm: Normal rate and regular rhythm.     Heart sounds: No murmur heard.   Pulmonary:     Effort: Pulmonary effort is normal. No respiratory distress.     Breath sounds: Normal breath sounds. No wheezing or rales.  Chest:     Chest wall: No tenderness.  Abdominal:     General: There is no distension.     Palpations: Abdomen is soft.     Tenderness: There is no abdominal tenderness. There is no guarding or rebound.  Musculoskeletal:        General: Tenderness present. Normal range of motion.     Right shoulder: Tenderness and bony tenderness present. No swelling or deformity. Normal strength. Normal pulse.     Left shoulder: Normal.     Cervical back: Normal range of motion and neck supple. No tenderness. No spinous process tenderness or muscular tenderness.     Comments:  Superficial abrasion over the posterior shoulder.  Full passive range of motion with minimal tenderness.  No tenderness with palpation of the thoracic or lumbar spine.  Full range of motion of bilateral hips actively without pain.  No knee or ankle pain bilaterally.  Skin:    General: Skin is warm and dry.     Capillary Refill: Capillary refill takes less than 2 seconds.     Findings: No erythema or rash.  Neurological:     General: No focal deficit present.     Mental Status: He is alert and oriented to person, place, and time. Mental status is at baseline.     Comments: 5 out of 5 strength in bilateral lower extremities with normal sensation.  5 out of 5 strength in bilateral upper extremities with normal sensation  Psychiatric:        Mood and Affect: Mood normal.        Behavior: Behavior normal.        Thought Content: Thought content normal.     ED Results / Procedures / Treatments   Labs (all labs ordered are listed, but only abnormal results are displayed) Labs Reviewed - No data to display  EKG None  Radiology DG Shoulder Right  Result Date: 12/24/2019 CLINICAL DATA:  Pain after fall. EXAM: RIGHT SHOULDER - 2+ VIEW COMPARISON:  Chest x-ray 11/20/2019.  CT chest 07/29/2006. FINDINGS: Acromioclavicular glenohumeral degenerative change. Small loose body may be present. No evidence of fracture or dislocation bone island again noted the right anterior fifth rib. Scratch IMPRESSION: Acromioclavicular and glenohumeral degenerative change. Small loose body may be present. No acute abnormality identified. Electronically Signed   By: Marcello Moores  Register   On: 12/24/2019 15:20   CT HEAD WO CONTRAST  Result Date: 12/24/2019 CLINICAL DATA:  Head trauma, minor. Neck trauma. Additional history provided: Fall. EXAM: CT HEAD WITHOUT CONTRAST CT CERVICAL SPINE WITHOUT  CONTRAST TECHNIQUE: Multidetector CT imaging of the head and cervical spine was performed following the standard protocol without  intravenous contrast. Multiplanar CT image reconstructions of the cervical spine were also generated. COMPARISON:  Head CT 10/21/2019.  Cervical spine MRI 07/17/2019. FINDINGS: CT HEAD FINDINGS Brain: Mild generalized cerebral atrophy. There is no acute intracranial hemorrhage. No demarcated cortical infarct. No extra-axial fluid collection. No evidence of intracranial mass. No midline shift. Vascular: No hyperdense vessel.  Atherosclerotic calcifications. Skull: Normal. Negative for fracture or focal lesion. Sinuses/Orbits: Visualized orbits show no acute finding. Mild ethmoid, sphenoid and maxillary sinus mucosal thickening. Partial opacification of the right nasal passage. Other: Right parietal scalp soft tissue swelling. CT CERVICAL SPINE FINDINGS Alignment: Trace grade 1 anterolisthesis at C2-C3, C3-C4, C5-C6 and C6-C7 Skull base and vertebrae: The basion-dental and atlanto-dental intervals are maintained.No evidence of acute fracture to the cervical spine. Subtle rounded deformity of the anterior C7 superior endplate appears degenerative. Soft tissues and spinal canal: No prevertebral fluid or swelling. No visible canal hematoma. Disc levels: Cervical spondylosis with multilevel disc space narrowing, disc bulges, uncovertebral hypertrophy and facet arthrosis. Upper chest: Partially imaged left pleural effusion. No visible airspace consolidation or pneumothorax. Other: Subcentimeter thyroid nodules not meeting consensus criteria for ultrasound follow-up. Atherosclerotic calcifications within the proximal major branch vessels of the neck, distal common carotid arteries, carotid bifurcations and proximal ICAs. IMPRESSION: CT head: 1. No evidence of acute intracranial abnormality. 2. Right parietal scalp soft tissue swelling. 3. Mild cerebral atrophy. 4. Mild ethmoid, sphenoid and maxillary sinus mucosal thickening. 5. Nonspecific partial opacification of the right nasal passage. CT cervical spine: 1. No evidence  of acute fracture to the cervical spine. 2. Trace grade 1 anterolisthesis at C2-C3, C3-C4, C5-C6 and C6-C7. 3. Cervical spondylosis as described. 4. Partially imaged left pleural effusion. 5. Atherosclerotic disease. Electronically Signed   By: Kellie Simmering DO   On: 12/24/2019 15:18   CT Cervical Spine Wo Contrast  Result Date: 12/24/2019 CLINICAL DATA:  Head trauma, minor. Neck trauma. Additional history provided: Fall. EXAM: CT HEAD WITHOUT CONTRAST CT CERVICAL SPINE WITHOUT CONTRAST TECHNIQUE: Multidetector CT imaging of the head and cervical spine was performed following the standard protocol without intravenous contrast. Multiplanar CT image reconstructions of the cervical spine were also generated. COMPARISON:  Head CT 10/21/2019.  Cervical spine MRI 07/17/2019. FINDINGS: CT HEAD FINDINGS Brain: Mild generalized cerebral atrophy. There is no acute intracranial hemorrhage. No demarcated cortical infarct. No extra-axial fluid collection. No evidence of intracranial mass. No midline shift. Vascular: No hyperdense vessel.  Atherosclerotic calcifications. Skull: Normal. Negative for fracture or focal lesion. Sinuses/Orbits: Visualized orbits show no acute finding. Mild ethmoid, sphenoid and maxillary sinus mucosal thickening. Partial opacification of the right nasal passage. Other: Right parietal scalp soft tissue swelling. CT CERVICAL SPINE FINDINGS Alignment: Trace grade 1 anterolisthesis at C2-C3, C3-C4, C5-C6 and C6-C7 Skull base and vertebrae: The basion-dental and atlanto-dental intervals are maintained.No evidence of acute fracture to the cervical spine. Subtle rounded deformity of the anterior C7 superior endplate appears degenerative. Soft tissues and spinal canal: No prevertebral fluid or swelling. No visible canal hematoma. Disc levels: Cervical spondylosis with multilevel disc space narrowing, disc bulges, uncovertebral hypertrophy and facet arthrosis. Upper chest: Partially imaged left pleural  effusion. No visible airspace consolidation or pneumothorax. Other: Subcentimeter thyroid nodules not meeting consensus criteria for ultrasound follow-up. Atherosclerotic calcifications within the proximal major branch vessels of the neck, distal common carotid arteries, carotid bifurcations and proximal ICAs. IMPRESSION: CT head: 1.  No evidence of acute intracranial abnormality. 2. Right parietal scalp soft tissue swelling. 3. Mild cerebral atrophy. 4. Mild ethmoid, sphenoid and maxillary sinus mucosal thickening. 5. Nonspecific partial opacification of the right nasal passage. CT cervical spine: 1. No evidence of acute fracture to the cervical spine. 2. Trace grade 1 anterolisthesis at C2-C3, C3-C4, C5-C6 and C6-C7. 3. Cervical spondylosis as described. 4. Partially imaged left pleural effusion. 5. Atherosclerotic disease. Electronically Signed   By: Kellie Simmering DO   On: 12/24/2019 15:18    Procedures Procedures (including critical care time)  Medications Ordered in ED Medications  acetaminophen (TYLENOL) tablet 1,000 mg (has no administration in time range)    ED Course  I have reviewed the triage vital signs and the nursing notes.  Pertinent labs & imaging results that were available during my care of the patient were reviewed by me and considered in my medical decision making (see chart for details).    MDM Rules/Calculators/A&P                          Patient with a fall at home when he lost balance due to missing a step.  He did fall with head injury and recent history this summer of a fall down multiple stairs with intercranial hemorrhage requiring emergent surgical evacuation.  Patient has been cleared and has been ambulating without assistance.  He denies feeling poorly today or yesterday.  This is his first fall since the incident this summer.  Patient has a mild headache and reports he hit the other side of his head.  He is neurologically intact at this time.  He does have pain with  range of motion of his shoulder but no obvious deformities.  Plain film of the shoulder pending, head and cervical CT pending.  Patient given Tylenol.  He is able to fully range bilateral hips and was able to ambulate after the fall without significant hip pain with low suspicion for fracture at this time.  Initially was having some tenderness over his coccyx which is now resolved.  3:50 PM Imaging neg for acute findings.  Pt still feeling well and will d/c home.  MDM Number of Diagnoses or Management Options   Amount and/or Complexity of Data Reviewed Tests in the radiology section of CPT: ordered and reviewed Independent visualization of images, tracings, or specimens: yes    Final Clinical Impression(s) / ED Diagnoses Final diagnoses:  Contusion of scalp, initial encounter  Contusion of right shoulder, initial encounter    Rx / DC Orders ED Discharge Orders    None       Blanchie Dessert, MD 12/24/19 1551

## 2019-12-25 ENCOUNTER — Telehealth: Payer: Self-pay | Admitting: *Deleted

## 2019-12-25 NOTE — Telephone Encounter (Signed)
[name] left a VM on clinic line yesterday @4 :47 that Mr [name] was in the ED at [facility] after a fall striking the opposite side of his head.

## 2019-12-28 NOTE — Telephone Encounter (Signed)
I will call today.

## 2020-01-04 ENCOUNTER — Telehealth: Payer: Self-pay | Admitting: *Deleted

## 2020-01-04 ENCOUNTER — Other Ambulatory Visit: Payer: Self-pay

## 2020-01-04 ENCOUNTER — Other Ambulatory Visit: Payer: Self-pay | Admitting: Physical Medicine and Rehabilitation

## 2020-01-04 ENCOUNTER — Ambulatory Visit
Admission: RE | Admit: 2020-01-04 | Discharge: 2020-01-04 | Disposition: A | Discharge status: Home / Self Care | Payer: PPO | Source: Ambulatory Visit | Attending: Physical Medicine and Rehabilitation | Admitting: Physical Medicine and Rehabilitation

## 2020-01-04 DIAGNOSIS — M545 Low back pain, unspecified: Secondary | ICD-10-CM | POA: Diagnosis not present

## 2020-01-04 DIAGNOSIS — W19XXXS Unspecified fall, sequela: Secondary | ICD-10-CM

## 2020-01-04 IMAGING — CR DG LUMBAR SPINE 2-3V
3 series · 3 of 3 positions shown · non-contrast
Comparison: Lumbar spine radiograph dated [DATE].

CLINICAL DATA: 86-year-old male with back pain.

EXAM:
LUMBAR SPINE - 2-3 VIEW

[w lumbar spine ap]
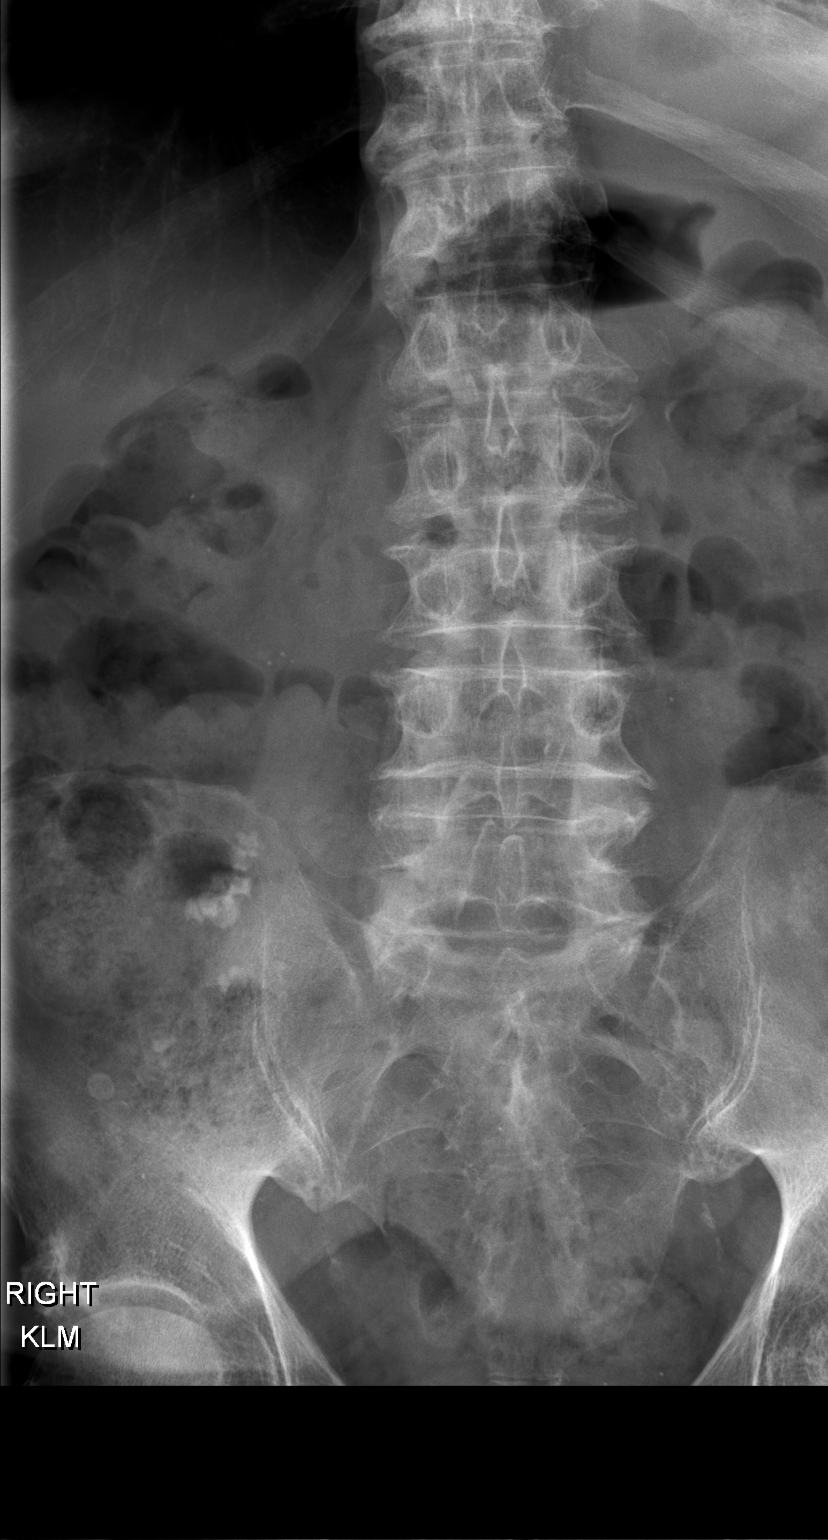

[w lumbar spine lat]
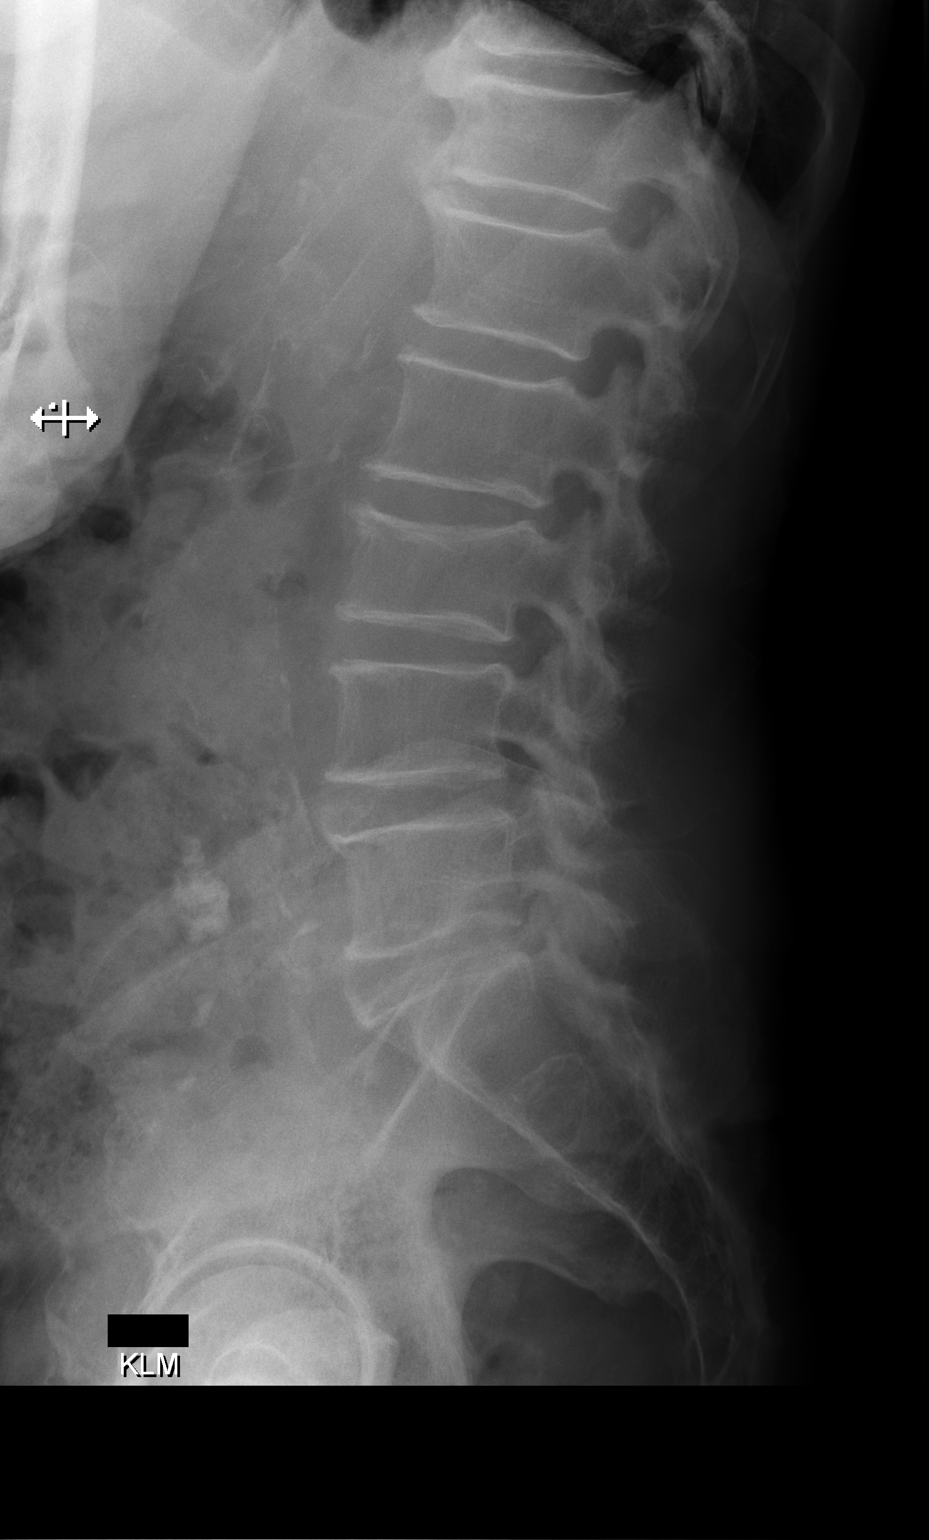

[w lumbar l-5 s-1 spot]
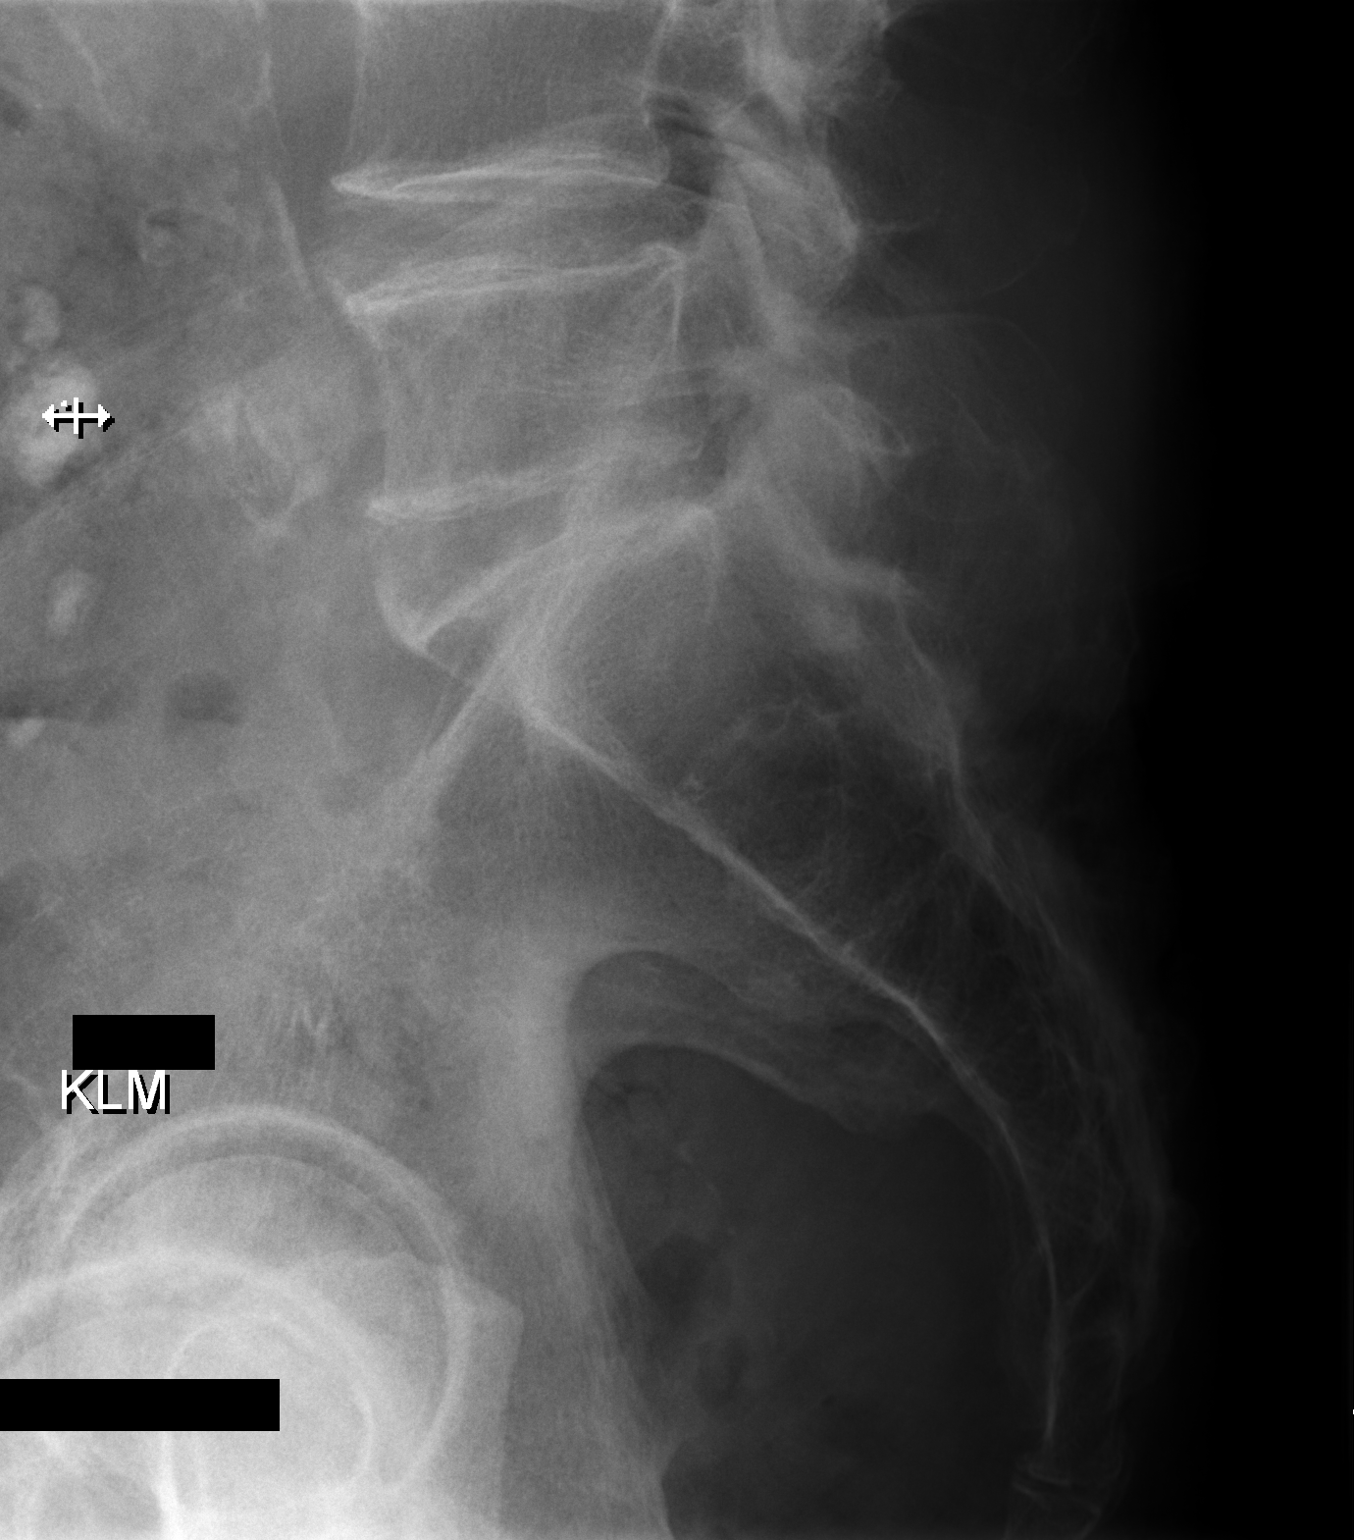

[3 of 3 positions shown; findings below may reference images not displayed]

FINDINGS: Five lumbar type vertebra. There is no acute fracture or subluxation
of the lumbar spine. Multilevel degenerative changes with spurring.
Old compression deformities as seen previously. There is multilevel
facet arthropathy. Atherosclerotic calcification of the aorta.
Coarse calcification over the right lower quadrant as seen
previously. The soft tissues are unremarkable.
IMPRESSION: 1. No acute fracture or subluxation of the lumbar spine.
2. Multilevel degenerative changes.

## 2020-01-04 MED ORDER — LIDOCAINE 5 % EX PTCH: 1.0000 | MEDICATED_PATCH | CUTANEOUS | 0 refills | Status: DC

## 2020-01-04 NOTE — Telephone Encounter (Signed)
Patients wife left a message this morning that patient has increased pain over the weekend.  Patients wife reports patient yelling out every time he transitions. She reports that pain is located in areas that are new including right side of head, right side of body, right lower abdomen. He still has pain in lower back as previously reported.  They have tried tylenol and heating pad.  Patient is still having lots of breakthrough pain and discomfort.  Patient refuses another trip to ED.  Patients wife asks for Dr. Ranell Patrick to please call.

## 2020-01-05 ENCOUNTER — Ambulatory Visit (INDEPENDENT_AMBULATORY_CARE_PROVIDER_SITE_OTHER): Payer: PPO | Admitting: Physician Assistant

## 2020-01-05 ENCOUNTER — Encounter: Payer: Self-pay | Admitting: Physician Assistant

## 2020-01-05 VITALS — BP 140/60 | HR 84 | Ht 67.0 in | Wt 176.0 lb

## 2020-01-05 DIAGNOSIS — I2510 Atherosclerotic heart disease of native coronary artery without angina pectoris: Secondary | ICD-10-CM

## 2020-01-05 DIAGNOSIS — R7989 Other specified abnormal findings of blood chemistry: Secondary | ICD-10-CM

## 2020-01-05 DIAGNOSIS — I10 Essential (primary) hypertension: Secondary | ICD-10-CM | POA: Diagnosis not present

## 2020-01-05 DIAGNOSIS — N1832 Chronic kidney disease, stage 3b: Secondary | ICD-10-CM

## 2020-01-05 DIAGNOSIS — I6529 Occlusion and stenosis of unspecified carotid artery: Secondary | ICD-10-CM

## 2020-01-05 DIAGNOSIS — E782 Mixed hyperlipidemia: Secondary | ICD-10-CM | POA: Diagnosis not present

## 2020-01-05 DIAGNOSIS — J90 Pleural effusion, not elsewhere classified: Secondary | ICD-10-CM | POA: Diagnosis not present

## 2020-01-05 NOTE — Patient Instructions (Signed)
Medication Instructions:  Your physician recommends that you continue on your current medications as directed. Please refer to the Current Medication list given to you today.  *If you need a refill on your cardiac medications before your next appointment, please call your pharmacy*  Lab Work: You will have labs drawn today: CMET  If you have labs (blood work) drawn today and your tests are completely normal, you will receive your results only by: Marland Kitchen MyChart Message (if you have MyChart) OR . A paper copy in the mail If you have any lab test that is abnormal or we need to change your treatment, we will call you to review the results.  Testing/Procedures: None ordered today  Follow-Up: At [facility], you and your health needs are our priority.  As part of our continuing mission to provide you with exceptional heart care, we have created designated Provider Care Teams.  These Care Teams include your primary Cardiologist (physician) and Advanced Practice Providers (APPs -  Physician Assistants and Nurse Practitioners) who all work together to provide you with the care you need, when you need it.  We recommend signing up for the patient portal called "MyChart".  Sign up information is provided on this After Visit Summary.  MyChart is used to connect with patients for Virtual Visits (Telemedicine).  Patients are able to view lab/test results, encounter notes, upcoming appointments, etc.  Non-urgent messages can be sent to your provider as well.   To learn more about what you can do with MyChart, go to [email or web address].    Your next appointment:   6 month(s)  The format for your next appointment:   In Person  Provider:   You may see Sherren Mocha, MD or Richardson Dopp, PA-C  Other Instructions Call if your systolic (top number) blood pressure is over 150

## 2020-01-05 NOTE — Progress Notes (Signed)
Cardiology Office Note:    Date:  01/05/2020   ID:  [name], DOB 04-11-1933, MRN [id number]  PCP:  [name], MD  [facility] Cardiologist:  Sherren Mocha, MD   [facility] Electrophysiologist:  None   Referring MD: [name], MD   Chief Complaint:  Follow-up (CAD)    Patient Profile:    [name] is a 86 y.o. male with:   Coronary artery disease   S/p Cypher DES to LAD in 6/08  GXT in 2014: Normal  Hypertension   Hyperlipidemia   Carotid artery disease  [place] 10/21: bilat ICA 1-39; L VA stenosis   Chronic kidney disease, stage III  Recurring L pleural effusion  S/p thoracentesis in 8/21 and 10/21; cytology neg for malignancy; exudative  Prior CV studies: Carotid US 11/20/2019 Bilateral ICA 1-39; left vertebral artery stenosis  Echocardiogram 10/16/2019 EF 50-55, GR 1 DD, inferolateral HK, normal RVSF, small pericardial effusion  History of Present Illness:    [name] was admitted in 6/21 with a mechanical fall down the stairs resulting in a left clavicular fracture, left thumb fracture, left rib fractures with associated trace pneumothorax, subarachnoid hemorrhage.  He spent 2 weeks in inpatient rehab after discharge.  He was seen by Dr. [name] in August 2021 with symptoms of worsening shortness of breath.  An echocardiogram demonstrated low normal LV function with mild diastolic dysfunction.  Chest x-ray demonstrated an enlarging left pleural effusion.  He was set up for thoracentesis with interventional radiology yielding 1.6 L of pleural fluid.  Cytology showed no malignancy.  Fluid analysis was consistent with exudate.    He was seen in the emergency room twice in September with cough but no fever.  Chest x-ray demonstrated enlarging left pleural effusion.  He was referred to pulmonology.  Etiology was felt to be postinflammatory changes after fall on left side versus parapneumonic effusion after febrile illness  treated with antibiotics in July 2021.  He had repeat thoracentesis performed in the office in October 2021 with Dr. Silas Flood.  Cytology was again neg for malignant cells.  He has f/u with pulmonology in Dec 2021.   He returns for f/u.  He is here with his wife.  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  Overall, he is doing well.  He has not had chest pain.  His breathing is better since his last thoracentesis.  He has not had syncope, orthopnea, significant leg swelling.  He did fall a few weeks ago but lost his balance.  He did not have syncope or near syncope.       Past Medical History:  Diagnosis Date  . Allergy   . BPH (benign prostatic hyperplasia)   . BPH (benign prostatic hypertrophy)   . CAD (coronary artery disease)    s/p cypher DES to pLAD 6/08; normal LVF;  ETT-Myoview 2009: no ischemia   . Coronary artery disease   . Eosinophilia   . High cholesterol   . HTN (hypertension)   . Hyperlipidemia   . Hypertension   . MI (myocardial infarction) ([place])   . Myocardial infarction ([place])   . Prediabetes   . Trigeminal neuralgia     Current Medications: Current Meds  Medication Sig  . acetaminophen (TYLENOL) 500 MG tablet Take 1,000 mg by mouth every 6 (six) hours as needed for mild pain, moderate pain  or headache.   . albuterol (PROAIR HFA) 108 (90 Base) MCG/ACT inhaler INHALE 2 PUFFS INTO THE LUNGS EVERY 6 HOURS AS NEEDED FOR WHEEZING OR SHORTNESS OF BREATH  . amLODipine (NORVASC) 5 MG tablet Take 1 tablet (5 mg total) by mouth daily.  Marland Kitchen Apoaequorin (PREVAGEN) 10 MG CAPS Take 10 mg by mouth daily.  Marland Kitchen atorvastatin (LIPITOR) 40 MG tablet Take 40 mg by mouth at bedtime.   Marland Kitchen BREO ELLIPTA 200-25 MCG/INH AEPB INHALE 1 PUFF BY MOUTH EVERY DAY  . Calcium Carb-Cholecalciferol (CALCIUM 600+D3 PO) Take 1-2 tablets by mouth daily.  Marland Kitchen doxazosin (CARDURA) 2 MG tablet TAKE 1 TABLET  BY MOUTH EVERY DAY  . escitalopram (LEXAPRO) 10 MG tablet TAKE 1 TABLET BY MOUTH EVERY DAY  . ezetimibe (ZETIA) 10 MG tablet TAKE 1 TABLET BY MOUTH EVERY DAY  . lidocaine (LIDODERM) 5 % Place 1 patch onto the skin daily. Remove & Discard patch within 12 hours or as directed by MD  . loratadine (CLARITIN) 10 MG tablet Take 10 mg by mouth daily as needed for allergies or rhinitis.   . Multiple Vitamin (MULTIVITAMIN WITH MINERALS) TABS tablet Take 1 tablet by mouth daily.     Allergies:   Gabapentin   Social History   Tobacco Use  . Smoking status: Never Smoker  . Smokeless tobacco: Never Used  . Tobacco comment: quit over 28yrs ago.  Substance Use Topics  . Alcohol use: Never  . Drug use: Never     Family Hx: The patient's family history includes Aortic aneurysm in his brother and brother; Arthritis in his daughter; Colon cancer in his brother and brother; Coronary artery disease in his brother; Coronary artery disease (age of onset: 26) in his father; Heart disease in his brother and father; Stomach cancer in his mother. There is no history of Esophageal cancer or Rectal cancer.  Review of Systems  Musculoskeletal: Positive for back pain (severe low back pain; no radicular symptoms.).     EKGs/Labs/Other Test Reviewed:    EKG:  EKG is not ordered today.  The ekg ordered today demonstrates n/a  Recent Labs: 11/01/2019: ALT 67; B Natriuretic Peptide 105.9; BUN 14; Creatinine, Ser 1.35; Hemoglobin 11.7; Platelets 183; Potassium 3.9; Sodium 136   Recent Lipid Panel Lab Results  Component Value Date/Time   CHOL 186 07/29/2019 01:40 PM   CHOL 129 04/26/2019 07:30 AM   TRIG 116 07/29/2019 01:40 PM   HDL 48 07/29/2019 01:40 PM   HDL 58 04/26/2019 07:30 AM   CHOLHDL 3.9 07/29/2019 01:40 PM   LDLCALC 115 (H) 07/29/2019 01:40 PM   LDLCALC 58 04/26/2019 07:30 AM   LDLCALC 92 04/07/2018 08:27 AM      Risk Assessment/Calculations:      Physical Exam:    VS:  BP 140/60   Pulse  84   Ht 5\' 7"  (1.702 m)   Wt 176 lb (79.8 kg)   SpO2 96%   BMI 27.57 kg/m     Wt Readings from Last 3 Encounters:  01/05/20 176 lb (79.8 kg)  12/24/19 173 lb (78.5 kg)  11/20/19 182 lb 3.2 oz (82.6 kg)     Constitutional:      Appearance: Healthy appearance. Not in distress.  Pulmonary:     Effort: Pulmonary effort is normal.     Breath sounds: No wheezing. No rales.  Cardiovascular:     Normal rate. Regular rhythm. Normal S1. Normal S2.     Murmurs: There is no murmur.  Edema:    Peripheral edema absent.  Abdominal:     Palpations: Abdomen is soft.  Musculoskeletal:     Cervical back: Neck supple. Skin:    General: Skin is warm and dry.  Neurological:     General: No focal deficit present.     Mental Status: Alert and oriented to person, place and time.     Cranial Nerves: Cranial nerves are intact.      ASSESSMENT & PLAN:    1. Coronary artery disease involving native coronary artery of native heart without angina pectoris History of PCI with Cypher DES to the LAD in June 2008.  He had a normal GXT in 2014.  He is doing well without anginal symptoms.  Continue current medications.  2. Essential hypertension Blood pressure somewhat elevated.  I have asked him to continue to monitor his blood pressure and notify me if his systolic gets to 492 or higher.  He had multiple medication stopped when he was hospitalized due to hypotension.  3. Mixed hyperlipidemia 4. Elevated LFTs He remains on atorvastatin and ezetimibe.  Recent ALT was elevated at 67.  I will obtain a follow-up CMET today to recheck his liver enzymes  5. Stenosis of carotid artery, unspecified laterality Mild bilateral plaque on most recent Dopplers.  Continue statin therapy.  6. Pleural effusion Status post thoracentesis x2.  He has had significant improvement in symptoms since his last thoracentesis.  He sees pulmonology again next month.  7. Stage 3b chronic kidney disease ([place]) Obtain follow-up  CMET today.    Dispo:  No follow-ups on file.   Medication Adjustments/Labs and Tests Ordered: Current medicines are reviewed at length with the patient today.  Concerns regarding medicines are outlined above.  Tests Ordered: Orders Placed This Encounter  Procedures  . Comprehensive metabolic panel   Medication Changes: No orders of the defined types were placed in this encounter.   Signed, Richardson Dopp, PA-C  01/05/2020 4:47 PM    [facility] [address], [place], [place]  [address] Phone: [phone]; Fax: [phone]

## 2020-01-06 ENCOUNTER — Other Ambulatory Visit: Payer: Self-pay

## 2020-01-06 ENCOUNTER — Encounter: Payer: PPO | Attending: Registered Nurse | Admitting: Physical Medicine and Rehabilitation

## 2020-01-06 ENCOUNTER — Encounter: Payer: Self-pay | Admitting: Physical Medicine and Rehabilitation

## 2020-01-06 ENCOUNTER — Other Ambulatory Visit: Payer: Self-pay | Admitting: Cardiovascular Disease

## 2020-01-06 VITALS — BP 144/67 | HR 84 | Temp 97.7°F | Ht 67.0 in | Wt 180.0 lb

## 2020-01-06 DIAGNOSIS — G441 Vascular headache, not elsewhere classified: Secondary | ICD-10-CM | POA: Insufficient documentation

## 2020-01-06 DIAGNOSIS — M79644 Pain in right finger(s): Secondary | ICD-10-CM | POA: Insufficient documentation

## 2020-01-06 DIAGNOSIS — G500 Trigeminal neuralgia: Secondary | ICD-10-CM | POA: Insufficient documentation

## 2020-01-06 DIAGNOSIS — M47816 Spondylosis without myelopathy or radiculopathy, lumbar region: Secondary | ICD-10-CM | POA: Insufficient documentation

## 2020-01-06 DIAGNOSIS — R4189 Other symptoms and signs involving cognitive functions and awareness: Secondary | ICD-10-CM | POA: Diagnosis not present

## 2020-01-06 DIAGNOSIS — W19XXXS Unspecified fall, sequela: Secondary | ICD-10-CM | POA: Insufficient documentation

## 2020-01-06 DIAGNOSIS — I10 Essential (primary) hypertension: Secondary | ICD-10-CM | POA: Diagnosis not present

## 2020-01-06 DIAGNOSIS — E119 Type 2 diabetes mellitus without complications: Secondary | ICD-10-CM | POA: Insufficient documentation

## 2020-01-06 DIAGNOSIS — E782 Mixed hyperlipidemia: Secondary | ICD-10-CM

## 2020-01-06 LAB — COMPREHENSIVE METABOLIC PANEL
ALT: 64 IU/L — ABNORMAL HIGH (ref 0–44)
AST: 86 IU/L — ABNORMAL HIGH (ref 0–40)
Albumin/Globulin Ratio: 1.9 (ref 1.2–2.2)
Albumin: 4.2 g/dL (ref 3.6–4.6)
Alkaline Phosphatase: 350 IU/L — ABNORMAL HIGH (ref 44–121)
BUN/Creatinine Ratio: 15 (ref 10–24)
BUN: 25 mg/dL (ref 8–27)
Bilirubin Total: 0.4 mg/dL (ref 0.0–1.2)
CO2: 24 mmol/L (ref 20–29)
Calcium: 9.5 mg/dL (ref 8.6–10.2)
Chloride: 100 mmol/L (ref 96–106)
Creatinine, Ser: 1.62 mg/dL — ABNORMAL HIGH (ref 0.76–1.27)
GFR calc Af Amer: 44 mL/min/{1.73_m2} — ABNORMAL LOW (ref >59)
GFR calc non Af Amer: 38 mL/min/{1.73_m2} — ABNORMAL LOW (ref >59)
Globulin, Total: 2.2 g/dL (ref 1.5–4.5)
Glucose: 162 mg/dL — ABNORMAL HIGH (ref 65–99)
Potassium: 4.3 mmol/L (ref 3.5–5.2)
Sodium: 139 mmol/L (ref 134–144)
Total Protein: 6.4 g/dL (ref 6.0–8.5)

## 2020-01-06 NOTE — Progress Notes (Signed)
Subjective:    Patient ID: [name], male    DOB: [birth year], 86 y.o.   MRN: [id number]  HPI  [name] is an 86 year old man who presents regarding worsening lower back pain following a fall last week. He also has right sided headache, neck pain, and finger pain. He went to the ED following his fall and had a cervical MRI done. He has no worsening confusion or weakness in his extremities. He has been taking Tylenol for his pain and it helps a little. Over the phone I advised him to ice his lower back, which he has been doing, and this does provide temporary relief.   Prior history: He has been doing very well with his therapies. He asks about driving. He was told by SLP that he still has difficulties with attention.  His mother-in-law died from [place] thought she was vaccinated.   As night comes he asks the same questions and forgets things   Prior history: His wife is concerned that he naps 1-2 times per day in the afternoon. Sleeps well at night. He is getting up to urinate and has bedside commode. He does not get up to urinate as frequently.  He does has have night sweats at night. There was no evidence of infection.   Reviewed results of CT together.   Asks about return to driving.  Has increase in the shortness of breath. The bReo was forgotten for a few days. More labored breathing at night. Has asthma and he was off Breo for sometimes but has restarted. The fluid in his   His wife asks whether to get an MRI of his spone.  H is not using the incentive spirometer as much as he should. He has also been out of breath with therapy. He has been working very hard there.   ife is about to back to work part time. Her boss is understanding Not going to church. Got the vaccine both shots. Hopes to get booster. Don't knwo who is vacinated and who is.   No Echo in Chart. Has October follow-up in cardiology.   Wife did a two week round of prednisone which made it hard for to  breath.    Pain Inventory Average Pain 6 Pain Right Now 6 My pain is intermittent, sharp and burning  In the last 24 hours, has pain interfered with the following? General activity 10 Relation with others 8 Enjoyment of life 10 What TIME of day is your pain at its worst? morning  and night Sleep (in general) Fair  Pain is worse with: bending and inactivity Pain improves with: rest and heat/ice Relief from Meds: 5  Family History  Problem Relation Age of Onset  . Stomach cancer Mother   . Heart disease Father   . Heart disease Brother   . Aortic aneurysm Brother   . Colon cancer Brother   . Coronary artery disease Father 65  . Coronary artery disease Brother   . Aortic aneurysm Brother   . Colon cancer Brother   . Esophageal cancer Neg Hx   . Rectal cancer Neg Hx   . Arthritis Daughter        rheumatoid   Social History   Socioeconomic History  . Marital status: Married    Spouse name: [name]  . Number of children: 1  . Years of education: Not on file  . Highest education level: Not on file  Occupational History  . Occupation: retired professor  Comment: History   . Occupation: missionary  Tobacco Use  . Smoking status: Never Smoker  . Smokeless tobacco: Never Used  . Tobacco comment: quit over 32yrs ago.  Substance and Sexual Activity  . Alcohol use: Never  . Drug use: Never  . Sexual activity: Yes    Partners: Female  Other Topics Concern  . Not on file  Social History Narrative   ** Merged History Encounter **       Lives with his wife (second marriage in 2015, first marriage ended when his wife died alzheimer's disease). Since his remarriage, his adult daughter doesn't speak with him.   Social Determinants of Health   Financial Resource Strain:   . Difficulty of Paying Living Expenses: Not on file  Food Insecurity:   . Worried About Charity fundraiser in the Last Year: Not on file  . Ran Out of Food in the Last Year: Not on file    Transportation Needs:   . Lack of Transportation (Medical): Not on file  . Lack of Transportation (Non-Medical): Not on file  Physical Activity:   . Days of Exercise per Week: Not on file  . Minutes of Exercise per Session: Not on file  Stress:   . Feeling of Stress : Not on file  Social Connections:   . Frequency of Communication with Friends and Family: Not on file  . Frequency of Social Gatherings with Friends and Family: Not on file  . Attends Religious Services: Not on file  . Active Member of Clubs or Organizations: Not on file  . Attends Archivist Meetings: Not on file  . Marital Status: Not on file   Past Surgical History:  Procedure Laterality Date  . APPENDECTOMY    . CARDIAC CATHETERIZATION  07/30/2006   CORONARY ANGIOPLASTY WITH STENT PLACEMENT  . CARDIAC CATHETERIZATION    . EXPLORATORY LAPAROTOMY     age 43  . EXPLORATORY LAPAROTOMY    . IR THORACENTESIS ASP PLEURAL SPACE W/IMG GUIDE  10/05/2019   Past Surgical History:  Procedure Laterality Date  . APPENDECTOMY    . CARDIAC CATHETERIZATION  07/30/2006   CORONARY ANGIOPLASTY WITH STENT PLACEMENT  . CARDIAC CATHETERIZATION    . EXPLORATORY LAPAROTOMY     age 89  . EXPLORATORY LAPAROTOMY    . IR THORACENTESIS ASP PLEURAL SPACE W/IMG GUIDE  10/05/2019   Past Medical History:  Diagnosis Date  . Allergy   . BPH (benign prostatic hyperplasia)   . BPH (benign prostatic hypertrophy)   . CAD (coronary artery disease)    s/p cypher DES to pLAD 6/08; normal LVF;  ETT-Myoview 2009: no ischemia   . Coronary artery disease   . Eosinophilia   . High cholesterol   . HTN (hypertension)   . Hyperlipidemia   . Hypertension   . MI (myocardial infarction) ([place])   . Myocardial infarction ([place])   . Prediabetes   . Trigeminal neuralgia    BP (!) 144/67   Pulse 84   Temp 97.7 F (36.5 C)   Ht 5\' 7"  (1.702 m)   Wt 180 lb (81.6 kg)   SpO2 97%   BMI 28.19 kg/m   Opioid Risk Score:   Fall Risk Score:   `1  Depression screen PHQ 2/9  Depression screen [facility] 2/9 11/25/2019 09/23/2019 04/07/2018 03/18/2018 10/10/2017 05/06/2017 04/04/2017  Decreased Interest 0 1 1 2  0 0 0  Down, Depressed, Hopeless 0 0 0 1 0 0 0  PHQ - 2  Score 0 1 1 3  0 0 0  Altered sleeping - - 0 2 - - -  Tired, decreased energy - - 0 0 - - -  Change in appetite - - 0 0 - - -  Feeling bad or failure about yourself  - - 0 0 - - -  Trouble concentrating - - 0 0 - - -  Moving slowly or fidgety/restless - - 0 0 - - -  Suicidal thoughts - - 0 0 - - -  PHQ-9 Score - - 1 5 - - -  Difficult doing work/chores - - Not difficult at all Not difficult at all - - -  Some recent data might be hidden   Review of Systems  Musculoskeletal: Positive for gait problem.  Neurological: Positive for weakness.  All other systems reviewed and are negative.     Objective:   Physical Exam Gen: no distress, normal appearing HEENT: oral mucosa pink and moist, NCAT Cardio: Reg rate Chest: normal effort, normal rate of breathing Abd: soft, non-distended Ext: no edema Skin: intact Neuro: Alert and oriented x3 Musculoskeletal: 5/5 strength throughout. Aox3. Can spell WORLD backwards. Tenderness to palpation to bilateral lumbar paraspinals with trigger points.  Psych: pleasant, normal affect    Assessment & Plan:  Mr. Keysor is an 86 year old man who presents with bilateral lower back pain.  1) Impaired mobility and ADLs -Continue HEP -mobility is much improved.   2) Cognitive deficits -much improved -discussed good foods to eat. -Can continue Prevagen.  -He has been eating blueberries every day which I commended. Recommended adding blackberries and raspberries.   3) Shortness of breath:  -Improved after draining. Has remained resolved.   4) Napping during the day -Advised that naps are ok as long as he is sleeping well at night. Can be very refreshing.  -Has lessened  5) Hypertension -BP elevated. Dr. [name] has sent a BP med for  him.   6) Driving: RETURN TO DRIVING PLAN:  WITH THE SUPERVISION OF A LICENSED DRIVER, PLEASE DRIVE IN AN EMPTY PARKING LOT FOR AT LEAST 2-3 TRIALS TO TEST REACTION TIME, VISION, USE OF EQUIPMENT IN CAR, ETC.  IF SUCCESSFUL WITH THE PARKING LOT DRIVING, PROCEED TO SUPERVISED DRIVING TRIALS IN YOUR NEIGHBORHOOD STREETS AT LOW TRAFFIC TIMES TO TEST OBSERVATION TO TRAFFIC SIGNALS, REACTION TIME, ETC. PLEASE ATTEMPT AT LEAST 2-3 TRIALS IN YOUR NEIGHBORHOOD.  IF NEIGHBORHOOD DRIVING IS SUCCESSFUL, YOU MAY PROCEED TO DRIVING IN BUSIER AREAS IN YOUR COMMUNITY WITH SUPERVISION OF A LICENSED DRIVER. PLEASE ATTEMPT AT LEAST 4-5 TRIALS.  IF COMMUNITY DRIVING IS SUCCESSFUL, YOU MAY PROCEED TO DRIVING ALONE, DURING THE DAY TIME, PLEASE DO NOT DRIVE IF YOU FEEL FATIGUED OR UNDER THE INFLUENCE OF MEDICATION.   -Discussed driving school  7) Would be interested in support group for TBI once this is available.   8) DM type 2: 6/9 hgbA1c is 6.5.   9) Trigeminal neuralgia: Discussed that ethmoid sinus congestion could be be contributing. Continue heating pad which helps and start steam inhalation.   10) Bilateral facet arthropathy:  -Reviewed XR results with patient and wife.  -MRI ordered as he may want to consider medial branch blocks.  -AST and ALT are elevated so recommend stopping tylenol.  -Cr elevated: maintain hydration, avoid NSAIDs.  -Recommend applying blue emu oil. -Recommended turmeric daily.  -Prescribed 5% lidocaine patches but these were not covered by insurance. Recommended using one 4% lidocaine patch on either side of lower back -Recommended  continuing ice as well as using heat to relax muscles -Will perform trigger point injections next visit.   40 minutes spent in discussion of bilateral facet arthropathy, XR results,  Applying blue emu oil, trigger point injections next visit, discussion of headache and joint pain in right hand, discussion of medial branch block injections,  application of lidocaine patches, review of AST and ALT and Cr.

## 2020-01-06 NOTE — Patient Instructions (Addendum)
Blue emu oil Please call me if you want to try Flector patch and Cymbalta MRI lumbar spine orderde.  Ginger and turmeric  -Discussed following foods that may reduce pain: 1) Ginger 2) Blueberries 3) Salmon 4) Pumpkin seeds 5) dark chocolate 6) turmeric 7) tart cherries 8) virgin olive oil 9) chilli peppers 10) mint

## 2020-01-06 NOTE — Telephone Encounter (Signed)
rx refill

## 2020-01-08 ENCOUNTER — Other Ambulatory Visit: Payer: Self-pay | Admitting: Physical Medicine and Rehabilitation

## 2020-01-08 ENCOUNTER — Other Ambulatory Visit: Payer: Self-pay

## 2020-01-08 ENCOUNTER — Telehealth: Payer: Self-pay | Admitting: *Deleted

## 2020-01-08 DIAGNOSIS — R7989 Other specified abnormal findings of blood chemistry: Secondary | ICD-10-CM

## 2020-01-08 DIAGNOSIS — W19XXXS Unspecified fall, sequela: Secondary | ICD-10-CM

## 2020-01-08 DIAGNOSIS — E782 Mixed hyperlipidemia: Secondary | ICD-10-CM

## 2020-01-08 MED ORDER — ATORVASTATIN CALCIUM 20 MG PO TABS: 20.0000 mg | ORAL_TABLET | Freq: Every day | ORAL | 1 refills | Status: DC

## 2020-01-08 NOTE — Telephone Encounter (Signed)
Mrs Colings called to report that [name] has not known who she was 3x and called his daughter by his son'x name, and tried to exit the bedroom through the bathroom door which are on opposite sides of the room.  Mrs Lax wanted to update Dr Ranell Patrick on what was going on in case it should change his POC.

## 2020-01-08 NOTE — Telephone Encounter (Signed)
Please let her know I have ordered a CT Head w/o contrast at [facility] given his increasing confusion

## 2020-01-08 NOTE — Telephone Encounter (Signed)
Notified. 

## 2020-01-10 ENCOUNTER — Emergency Department (HOSPITAL_COMMUNITY)
Admission: EM | Admit: 2020-01-10 | Discharge: 2020-01-11 | Disposition: A | Discharge status: Home / Self Care | Payer: PPO | Attending: Emergency Medicine | Admitting: Emergency Medicine

## 2020-01-10 ENCOUNTER — Encounter (HOSPITAL_COMMUNITY): Payer: Self-pay | Admitting: Emergency Medicine

## 2020-01-10 ENCOUNTER — Emergency Department (HOSPITAL_COMMUNITY): Payer: PPO

## 2020-01-10 ENCOUNTER — Other Ambulatory Visit: Payer: Self-pay

## 2020-01-10 DIAGNOSIS — Z959 Presence of cardiac and vascular implant and graft, unspecified: Secondary | ICD-10-CM | POA: Diagnosis not present

## 2020-01-10 DIAGNOSIS — Z79899 Other long term (current) drug therapy: Secondary | ICD-10-CM | POA: Diagnosis not present

## 2020-01-10 DIAGNOSIS — W1839XD Other fall on same level, subsequent encounter: Secondary | ICD-10-CM | POA: Insufficient documentation

## 2020-01-10 DIAGNOSIS — S060X0D Concussion without loss of consciousness, subsequent encounter: Secondary | ICD-10-CM | POA: Diagnosis not present

## 2020-01-10 DIAGNOSIS — S0990XD Unspecified injury of head, subsequent encounter: Secondary | ICD-10-CM | POA: Diagnosis present

## 2020-01-10 DIAGNOSIS — S060X0A Concussion without loss of consciousness, initial encounter: Secondary | ICD-10-CM | POA: Diagnosis not present

## 2020-01-10 DIAGNOSIS — N1832 Chronic kidney disease, stage 3b: Secondary | ICD-10-CM | POA: Insufficient documentation

## 2020-01-10 DIAGNOSIS — I129 Hypertensive chronic kidney disease with stage 1 through stage 4 chronic kidney disease, or unspecified chronic kidney disease: Secondary | ICD-10-CM | POA: Diagnosis not present

## 2020-01-10 DIAGNOSIS — M545 Low back pain, unspecified: Secondary | ICD-10-CM

## 2020-01-10 DIAGNOSIS — R4182 Altered mental status, unspecified: Secondary | ICD-10-CM | POA: Diagnosis not present

## 2020-01-10 DIAGNOSIS — I2510 Atherosclerotic heart disease of native coronary artery without angina pectoris: Secondary | ICD-10-CM | POA: Diagnosis not present

## 2020-01-10 DIAGNOSIS — R410 Disorientation, unspecified: Secondary | ICD-10-CM | POA: Diagnosis not present

## 2020-01-10 DIAGNOSIS — E119 Type 2 diabetes mellitus without complications: Secondary | ICD-10-CM | POA: Insufficient documentation

## 2020-01-10 DIAGNOSIS — J0191 Acute recurrent sinusitis, unspecified: Secondary | ICD-10-CM | POA: Diagnosis not present

## 2020-01-10 DIAGNOSIS — J019 Acute sinusitis, unspecified: Secondary | ICD-10-CM | POA: Diagnosis not present

## 2020-01-10 LAB — CBC
HCT: 37.8 % — ABNORMAL LOW (ref 39.0–52.0)
Hemoglobin: 12 g/dL — ABNORMAL LOW (ref 13.0–17.0)
MCH: 30.2 pg (ref 26.0–34.0)
MCHC: 31.7 g/dL (ref 30.0–36.0)
MCV: 95 fL (ref 80.0–100.0)
Platelets: 258 10*3/uL (ref 150–400)
RBC: 3.98 MIL/uL — ABNORMAL LOW (ref 4.22–5.81)
RDW: 15.7 % — ABNORMAL HIGH (ref 11.5–15.5)
WBC: 7.7 10*3/uL (ref 4.0–10.5)
nRBC: 0 % (ref 0.0–0.2)

## 2020-01-10 LAB — COMPREHENSIVE METABOLIC PANEL
ALT: 46 U/L — ABNORMAL HIGH (ref 0–44)
AST: 54 U/L — ABNORMAL HIGH (ref 15–41)
Albumin: 3.5 g/dL (ref 3.5–5.0)
Alkaline Phosphatase: 282 U/L — ABNORMAL HIGH (ref 38–126)
Anion gap: 9 (ref 5–15)
BUN: 27 mg/dL — ABNORMAL HIGH (ref 8–23)
CO2: 26 mmol/L (ref 22–32)
Calcium: 9 mg/dL (ref 8.9–10.3)
Chloride: 101 mmol/L (ref 98–111)
Creatinine, Ser: 1.39 mg/dL — ABNORMAL HIGH (ref 0.61–1.24)
GFR, Estimated: 50 mL/min — ABNORMAL LOW (ref >60)
Glucose, Bld: 103 mg/dL — ABNORMAL HIGH (ref 70–99)
Potassium: 4.5 mmol/L (ref 3.5–5.1)
Sodium: 136 mmol/L (ref 135–145)
Total Bilirubin: 0.8 mg/dL (ref 0.3–1.2)
Total Protein: 6.5 g/dL (ref 6.5–8.1)

## 2020-01-10 LAB — URINALYSIS, ROUTINE W REFLEX MICROSCOPIC
Bacteria, UA: NONE SEEN
Bilirubin Urine: NEGATIVE
Glucose, UA: NEGATIVE mg/dL
Ketones, ur: NEGATIVE mg/dL
Leukocytes,Ua: NEGATIVE
Nitrite: NEGATIVE
Protein, ur: 100 mg/dL — AB
Specific Gravity, Urine: 1.018 (ref 1.005–1.030)
pH: 6 (ref 5.0–8.0)

## 2020-01-10 IMAGING — CT CT HEAD W/O CM
3 series · 15 of 47 positions shown, 18 images · non-contrast
Comparison: CT [DATE]

CLINICAL DATA: Altered mental status, confusion, recent fall
[DATE]

EXAM:
CT HEAD WITHOUT CONTRAST
TECHNIQUE: Contiguous axial images were obtained from the base of the skull
through the vertex without intravenous contrast.

[Series 3: head 5.0 h30s · axial · 0.47mm/px · z∈[+1522,+1672]mm · 9 of 36 slices shown, 12 images]
[im 3/36  brain]
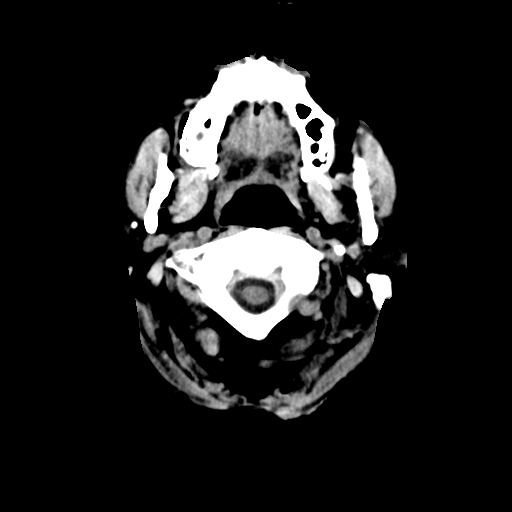
[im 3/36  bone]
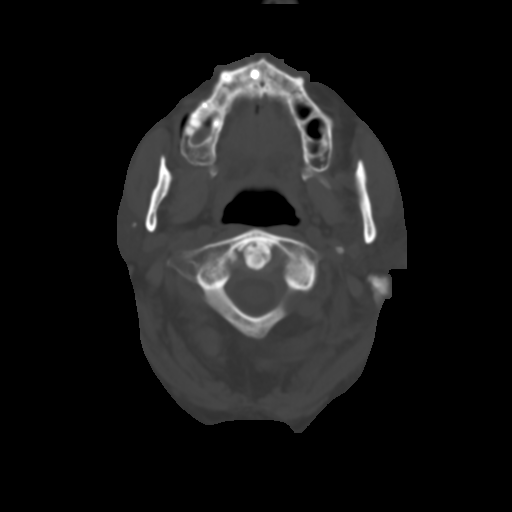
[im 7/36  brain]
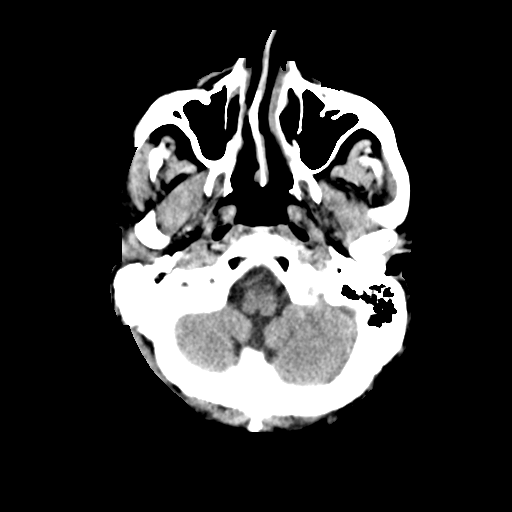
[im 10/36  brain]
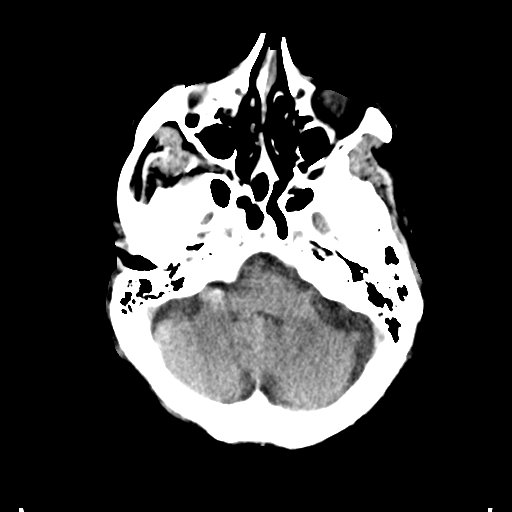
[im 14/36  brain]
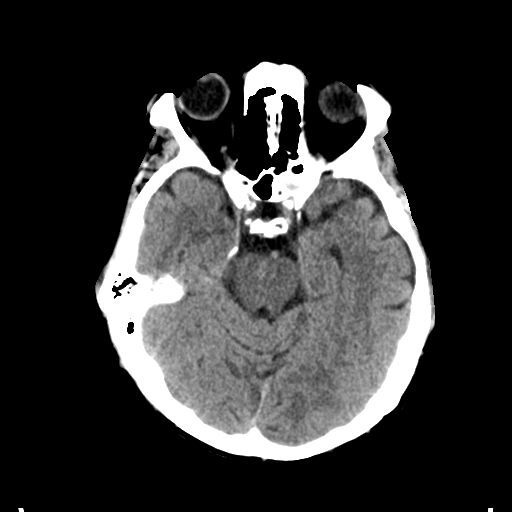
[im 19/36  brain]
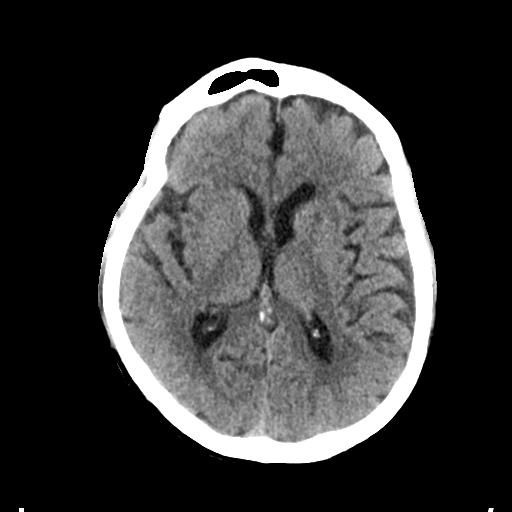
[im 19/36  bone]
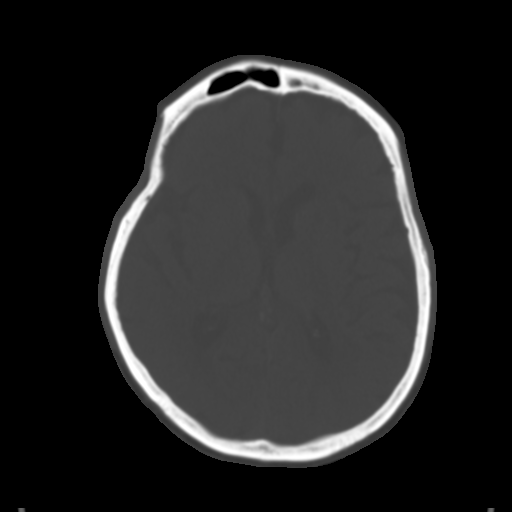
[im 22/36  brain]
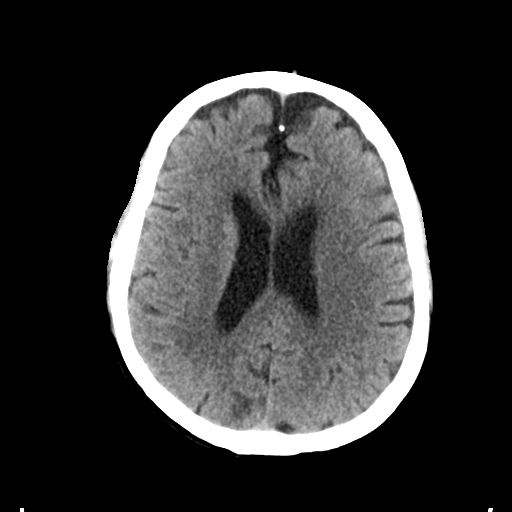
[im 26/36  brain]
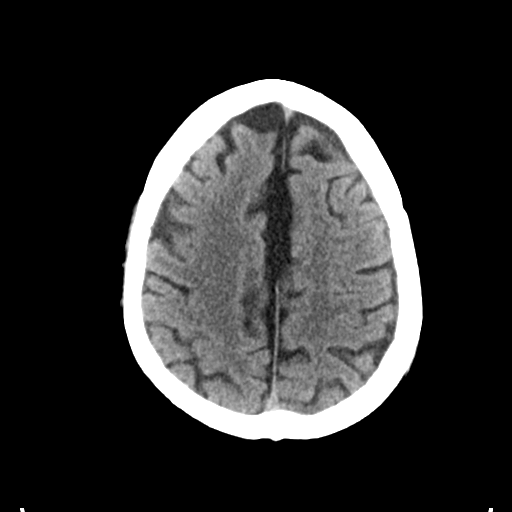
[im 29/36  brain]
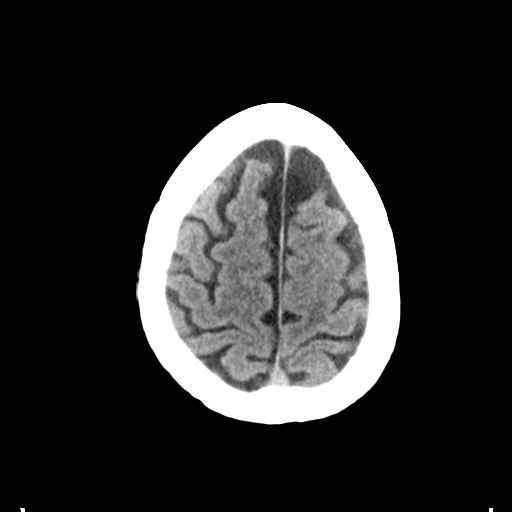
[im 33/36  brain]
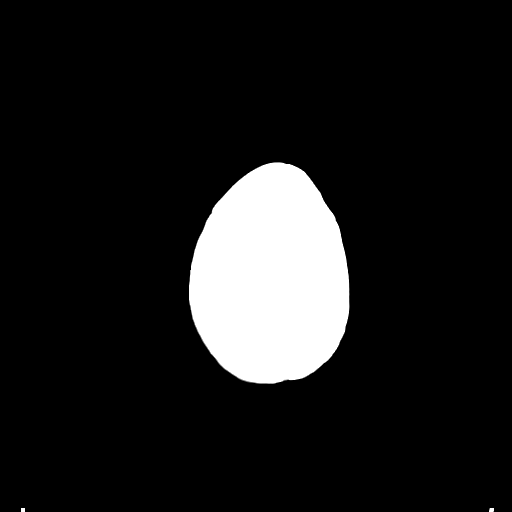
[im 33/36  bone]
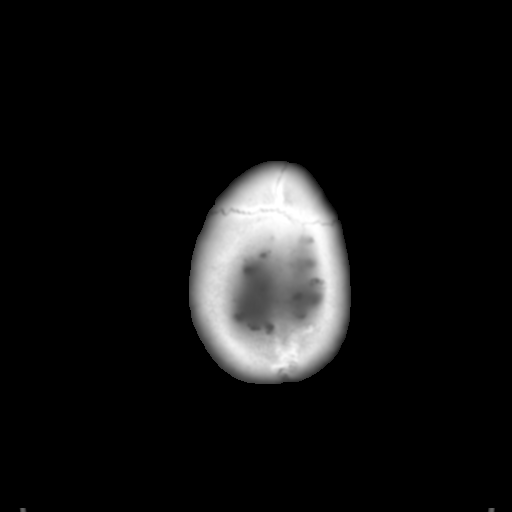

[Series 5: head 3.0 mpr cor · coronal · 0.35mm/px · 3 of 73 slices shown]
[im 25/73  brain]
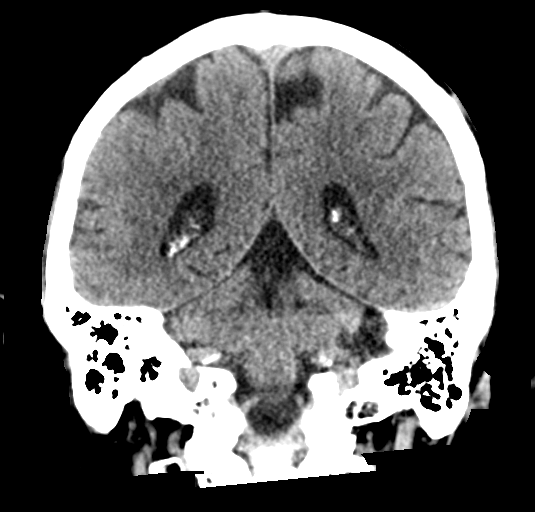
[im 33/73  brain]
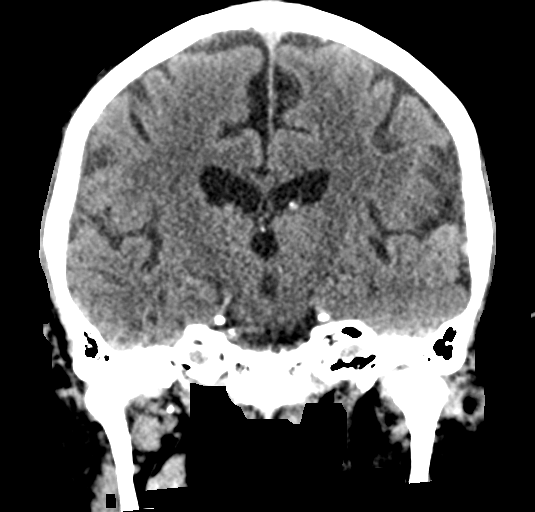
[im 41/73  brain]
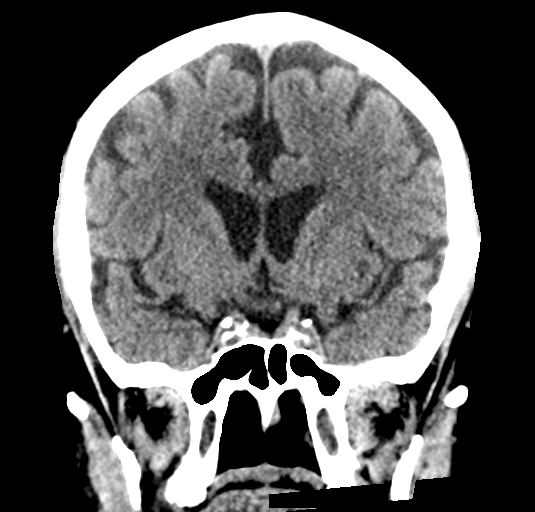

[Series 6: head 3.0 mpr sag · sagittal · 0.35mm/px · 3 of 65 slices shown]
[im 22/65  brain]
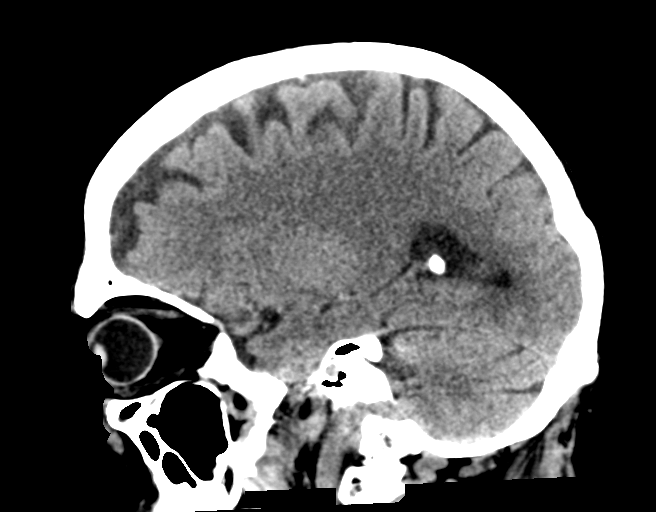
[im 32/65  brain]
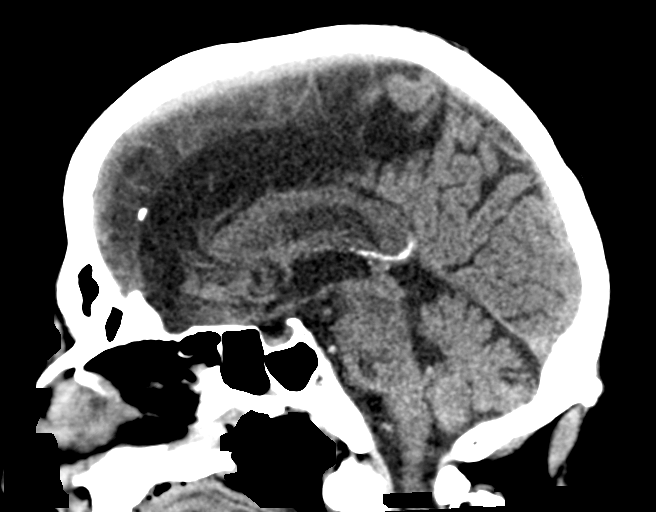
[im 42/65  brain]
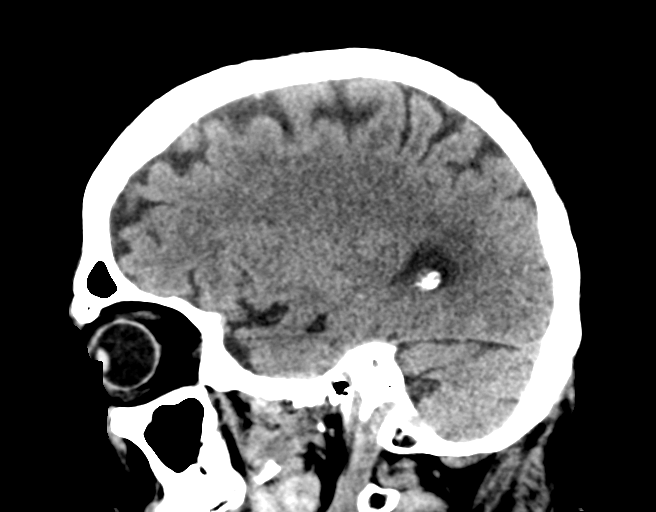

[15 of 47 positions shown; findings below may reference images not displayed]

FINDINGS: Brain: No evidence of acute infarction, hemorrhage, hydrocephalus,
extra-axial collection, visible mass lesion or mass effect.
Symmetric prominence of the ventricles, cisterns and sulci
compatible with parenchymal volume loss. Patchy areas of white
matter hypoattenuation are most compatible with chronic
microvascular angiopathy.

Vascular: Atherosclerotic calcification of the carotid siphons. No
hyperdense vessel.

Skull: Mild right frontoparietal scalp swelling/thickening without
large hematoma. No subjacent calvarial fracture. No other acute
sites of swelling.

Sinuses/Orbits: Mild diffuse paranasal sinus mural thickening with
layering air-fluid levels in the right maxillary sinus and few of
the ethmoid air cells. Rightward nasal septal deviation with a
contacting right-sided nasal septal spur. Mastoid air cells are
predominantly clear. Middle ear cavities are clear. Debris is seen
in the bilateral external auditory canals. Included orbital
structures are unremarkable.

Other: None.
IMPRESSION: 1. Mild right frontoparietal scalp swelling/thickening without large
hematoma or subjacent calvarial fracture.
2. No acute intracranial abnormality.
3. Chronic microvascular angiopathy and parenchymal volume loss.
4. Mild diffuse paranasal sinus disease with layering air-fluid
levels in the right maxillary sinus and few of the ethmoid air
cells. Correlate for acute on chronic sinusitis.
5. Debris in the bilateral external auditory canals, correlate for
cerumen impaction.

## 2020-01-10 NOTE — ED Triage Notes (Signed)
Pt arrives from home with his daughter who reports pt has had increased confusion in the last few days. Pt with recent fall on 11/18, and has been following up outpatient. Denies new falls, pt appears alert and oriented to baseline at this time. Verbal orders from Dr. Laverta Baltimore.

## 2020-01-11 ENCOUNTER — Telehealth: Payer: Self-pay | Admitting: Physical Medicine and Rehabilitation

## 2020-01-11 ENCOUNTER — Ambulatory Visit: Payer: Self-pay | Admitting: Family Medicine

## 2020-01-11 MED ORDER — OXYCODONE HCL 5 MG PO TABS
2.5000 mg | ORAL_TABLET | ORAL | 0 refills | Status: DC | PRN
Start: 2020-01-11 — End: 2020-01-25

## 2020-01-11 MED ORDER — CLARITHROMYCIN 500 MG PO TABS: 500.0000 mg | ORAL_TABLET | Freq: Two times a day (BID) | ORAL | 0 refills | Status: DC

## 2020-01-11 NOTE — Telephone Encounter (Signed)
I have tried calling patient to let him know his MRI was approved through [facility].  His mailbox is full and can't accept messages.  Patient will need to call [facility]'s centralized scheduling to get MRI scheduled, phone number is [phone].

## 2020-01-11 NOTE — ED Provider Notes (Signed)
[facility] EMERGENCY DEPARTMENT Provider Note   CSN: [id number] Arrival date & time: 01/10/20  2048     History Chief Complaint  Patient presents with  . Altered Mental Status    [name] is a 86 y.o. male.  Patient comes to the emergency department with concerns over increased confusion and forgetfulness.  Patient has had several falls recently, most recent fall was November 18.  He was seen in the emergency department at that time, has been experiencing frequent right-sided headaches and a clicking noise in his neck since then.  Patient also has been noted to be experiencing periods of forgetfulness and confusion.  Patient also being treated by primary care for right-sided low back pain.  He has recently had increased renal insufficiency and has a history of chronic elevation of LFTs, therefore cannot take OTC pain medications.  He has been using Lidoderm patch on his lower back with only minimal improvement.  No weakness of lower extremities.  No change in bowel or bladder function.  No new falls.        Past Medical History:  Diagnosis Date  . Allergy   . BPH (benign prostatic hyperplasia)   . BPH (benign prostatic hypertrophy)   . CAD (coronary artery disease)    s/p cypher DES to pLAD 6/08; normal LVF;  ETT-Myoview 2009: no ischemia   . Coronary artery disease   . Eosinophilia   . High cholesterol   . HTN (hypertension)   . Hyperlipidemia   . Hypertension   . MI (myocardial infarction) ([place])   . Myocardial infarction ([place])   . Prediabetes   . Trigeminal neuralgia     Patient Active Problem List   Diagnosis Date Noted  . Fracture of multiple ribs with pain 08/10/2019  . Clavicle fracture 08/10/2019  . Closed displaced fracture of phalanx of left thumb, sequela 08/10/2019  . TBI (traumatic brain injury) ([place]) 07/23/2019  . Traumatic closed fracture of distal clavicle with minimal displacement, left, initial encounter 07/19/2019  .  Traumatic brain injury with loss of consciousness ([place])   . Multiple trauma   . Benign prostatic hyperplasia   . Essential hypertension   . AKI (acute kidney injury) ([place])   . Stage 3b chronic kidney disease ([place])   . Prediabetes   . Acute blood loss anemia   . Thrombocytopenia ([place])   . Fall 07/14/2019  . Type 2 diabetes mellitus ([place])   . Eosinophilia   . Need for prophylactic vaccination and inoculation against influenza 11/12/2013  . Nocturnal leg cramps 11/23/2012  . Pain in joint, shoulder region 11/23/2012  . Fall 11/04/2012  . Hematoma 11/04/2012  . Acute upper respiratory infections of unspecified site 11/04/2012  . HYPERLIPIDEMIA TYPE IIB / III 03/08/2008  . Orthostatic hypotension 03/08/2008  . CAD, NATIVE VESSEL 03/08/2008    Past Surgical History:  Procedure Laterality Date  . APPENDECTOMY    . CARDIAC CATHETERIZATION  07/30/2006   CORONARY ANGIOPLASTY WITH STENT PLACEMENT  . CARDIAC CATHETERIZATION    . EXPLORATORY LAPAROTOMY     age 89  . EXPLORATORY LAPAROTOMY    . IR THORACENTESIS ASP PLEURAL SPACE W/IMG GUIDE  10/05/2019       Family History  Problem Relation Age of Onset  . Stomach cancer Mother   . Heart disease Father   . Heart disease Brother   . Aortic aneurysm Brother   . Colon cancer Brother   . Coronary artery disease Father 59  .  Coronary artery disease Brother   . Aortic aneurysm Brother   . Colon cancer Brother   . Esophageal cancer Neg Hx   . Rectal cancer Neg Hx   . Arthritis Daughter        rheumatoid    Social History   Tobacco Use  . Smoking status: Never Smoker  . Smokeless tobacco: Never Used  . Tobacco comment: quit over 33yrs ago.  Substance Use Topics  . Alcohol use: Never  . Drug use: Never    Home Medications Prior to Admission medications   Medication Sig Start Date End Date Taking? Authorizing Provider  acetaminophen (TYLENOL) 500 MG tablet Take 1,000 mg by mouth every 6 (six) hours as needed for mild pain,  moderate pain or headache.     [provider]  albuterol (PROAIR HFA) 108 (90 Base) MCG/ACT inhaler INHALE 2 PUFFS INTO THE LUNGS EVERY 6 HOURS AS NEEDED FOR WHEEZING OR SHORTNESS OF BREATH 08/06/19   Angiulli, Lavon Paganini, PA-C  amLODipine (NORVASC) 5 MG tablet Take 1 tablet (5 mg total) by mouth daily. 11/25/19 11/19/20  Sherren Mocha, MD  Apoaequorin (PREVAGEN) 10 MG CAPS Take 10 mg by mouth daily.    [provider]  atorvastatin (LIPITOR) 20 MG tablet Take 1 tablet (20 mg total) by mouth daily. 01/08/20   Richardson Dopp T, PA-C  BREO ELLIPTA 200-25 MCG/INH AEPB INHALE 1 PUFF BY MOUTH EVERY DAY 12/22/19   [name], MD  Calcium Carb-Cholecalciferol (CALCIUM 600+D3 PO) Take 1-2 tablets by mouth daily.    [provider]  clarithromycin (BIAXIN) 500 MG tablet Take 1 tablet (500 mg total) by mouth 2 (two) times daily. 01/11/20   Orpah Greek, MD  doxazosin (CARDURA) 2 MG tablet TAKE 1 TABLET BY MOUTH EVERY DAY 11/30/19   [name], MD  escitalopram (LEXAPRO) 10 MG tablet TAKE 1 TABLET BY MOUTH EVERY DAY 09/15/19   [name], MD  ezetimibe (ZETIA) 10 MG tablet TAKE 1 TABLET BY MOUTH EVERY DAY 11/30/19   Sherren Mocha, MD  lidocaine (LIDODERM) 5 % Place 1 patch onto the skin daily. Remove & Discard patch within 12 hours or as directed by MD 01/04/20   Ranell Patrick, Clide Deutscher, MD  loratadine (CLARITIN) 10 MG tablet Take 10 mg by mouth daily as needed for allergies or rhinitis.     [provider]  Multiple Vitamin (MULTIVITAMIN WITH MINERALS) TABS tablet Take 1 tablet by mouth daily.    [provider]  oxyCODONE (ROXICODONE) 5 MG immediate release tablet Take 0.5-1 tablets (2.5-5 mg total) by mouth every 4 (four) hours as needed for severe pain. 01/11/20   Orpah Greek, MD    Allergies    Gabapentin  Review of Systems   Review of Systems  Musculoskeletal: Positive for back pain.  Neurological: Positive for headaches.   Psychiatric/Behavioral: Positive for confusion and decreased concentration.  All other systems reviewed and are negative.   Physical Exam Updated Vital Signs BP (!) 149/71   Pulse 97   Temp 98.9 F (37.2 C) (Oral)   Resp 19   Ht 5' 7.5" (1.715 m)   Wt 79.4 kg   SpO2 95%   BMI 27.00 kg/m   Physical Exam Vitals and nursing note reviewed.  Constitutional:      General: He is not in acute distress.    Appearance: Normal appearance. He is well-developed.  HENT:     Head: Normocephalic and atraumatic.  Right Ear: Hearing normal.     Left Ear: Hearing normal.     Nose: Nose normal.  Eyes:     Conjunctiva/sclera: Conjunctivae normal.     Pupils: Pupils are equal, round, and reactive to light.  Cardiovascular:     Rate and Rhythm: Regular rhythm.     Heart sounds: S1 normal and S2 normal. No murmur heard.  No friction rub. No gallop.   Pulmonary:     Effort: Pulmonary effort is normal. No respiratory distress.     Breath sounds: Normal breath sounds.  Chest:     Chest wall: No tenderness.  Abdominal:     General: Bowel sounds are normal.     Palpations: Abdomen is soft.     Tenderness: There is no abdominal tenderness. There is no guarding or rebound. Negative signs include Murphy's sign and McBurney's sign.     Hernia: No hernia is present.  Musculoskeletal:        General: Normal range of motion.     Cervical back: Normal range of motion and neck supple.  Skin:    General: Skin is warm and dry.     Findings: No rash.  Neurological:     Mental Status: He is alert and oriented to person, place, and time.     GCS: GCS eye subscore is 4. GCS verbal subscore is 5. GCS motor subscore is 6.     Cranial Nerves: No cranial nerve deficit.     Sensory: No sensory deficit.     Coordination: Coordination normal.     Deep Tendon Reflexes:     Reflex Scores:      Patellar reflexes are 3+ on the right side.    Comments: No saddle anesthesia, normal sensation.  Strength equal  in both lower extremities although he does have some painful inhibition with raising the right leg.  No radicular pain with straight leg raising, does have some reproduced right-sided back pain.  Psychiatric:        Speech: Speech normal.        Behavior: Behavior normal.        Thought Content: Thought content normal.     ED Results / Procedures / Treatments   Labs (all labs ordered are listed, but only abnormal results are displayed) Labs Reviewed  CBC - Abnormal; Notable for the following components:      Result Value   RBC 3.98 (*)    Hemoglobin 12.0 (*)    HCT 37.8 (*)    RDW 15.7 (*)    All other components within normal limits  COMPREHENSIVE METABOLIC PANEL - Abnormal; Notable for the following components:   Glucose, Bld 103 (*)    BUN 27 (*)    Creatinine, Ser 1.39 (*)    AST 54 (*)    ALT 46 (*)    Alkaline Phosphatase 282 (*)    GFR, Estimated 50 (*)    All other components within normal limits  URINALYSIS, ROUTINE W REFLEX MICROSCOPIC - Abnormal; Notable for the following components:   Hgb urine dipstick MODERATE (*)    Protein, ur 100 (*)    All other components within normal limits    EKG None  Radiology CT Head Wo Contrast  Result Date: 01/10/2020 CLINICAL DATA:  Altered mental status, confusion, recent fall 12/24/2019 EXAM: CT HEAD WITHOUT CONTRAST TECHNIQUE: Contiguous axial images were obtained from the base of the skull through the vertex without intravenous contrast. COMPARISON:  CT 12/24/2019 FINDINGS: Brain: No  evidence of acute infarction, hemorrhage, hydrocephalus, extra-axial collection, visible mass lesion or mass effect. Symmetric prominence of the ventricles, cisterns and sulci compatible with parenchymal volume loss. Patchy areas of white matter hypoattenuation are most compatible with chronic microvascular angiopathy. Vascular: Atherosclerotic calcification of the carotid siphons. No hyperdense vessel. Skull: Mild right frontoparietal scalp  swelling/thickening without large hematoma. No subjacent calvarial fracture. No other acute sites of swelling. Sinuses/Orbits: Mild diffuse paranasal sinus mural thickening with layering air-fluid levels in the right maxillary sinus and few of the ethmoid air cells. Rightward nasal septal deviation with a contacting right-sided nasal septal spur. Mastoid air cells are predominantly clear. Middle ear cavities are clear. Debris is seen in the bilateral external auditory canals. Included orbital structures are unremarkable. Other: None. IMPRESSION: 1. Mild right frontoparietal scalp swelling/thickening without large hematoma or subjacent calvarial fracture. 2. No acute intracranial abnormality. 3. Chronic microvascular angiopathy and parenchymal volume loss. 4. Mild diffuse paranasal sinus disease with layering air-fluid levels in the right maxillary sinus and few of the ethmoid air cells. Correlate for acute on chronic sinusitis. 5. Debris in the bilateral external auditory canals, correlate for cerumen impaction. Electronically Signed   By: Lovena Le M.D.   On: 01/10/2020 21:29    Procedures Procedures (including critical care time)  Medications Ordered in ED Medications - No data to display  ED Course  I have reviewed the triage vital signs and the nursing notes.  Pertinent labs & imaging results that were available during my care of the patient were reviewed by me and considered in my medical decision making (see chart for details).    MDM Rules/Calculators/A&P                          Patient comes to the ER with concerns over forgetfulness and increased confusion which has been intermittent.  Family concerned about possible symptoms resulting from his recent fall.  He did have a CT head and cervical spine on November 18 with his last fall.  No acute abnormality noted.  Repeat CT today does not show any intracranial abnormalities that are acute.  He reports a clicking at the base of his head,  cervical spine but it is not exactly related to movement.  No neck pain or stiffness or tenderness.  No upper extremity numbness, tingling or weakness.  Does not require repeat cervical spine CT.  Head CT is consistent with sinus infection.  He has been having a lot of sinus congestion and increased drainage.  Will treat as an acute sinusitis.  Patient has point tenderness in the right lumbar paraspinal region.  No saddle anesthesia, foot drop.  Normal reflexes.  Normal sensation.  Painful inhibition of movement of the right leg because of reproduction of back pain but no radicular pain with movement.  PCP is scheduling outpatient lumbar MRI.  Does not require emergent MRI.  We will provide low-dose oxycodone without Tylenol to be used as needed for severe pain, otherwise controlled pain with heat, rest, Lidoderm.  Confusion likely multifactorial.  He is completely oriented currently but does at times seem to lose his train of thought.  Discussed the possibility of early dementia versus residual effects of concussion.  Could benefit from referral to outpatient neurology.  Final Clinical Impression(s) / ED Diagnoses Final diagnoses:  Concussion without loss of consciousness, subsequent encounter  Acute sinusitis, recurrence not specified, unspecified location  Acute right-sided low back pain without sciatica  Rx / DC Orders ED Discharge Orders         Ordered    clarithromycin (BIAXIN) 500 MG tablet  2 times daily        01/11/20 0438    oxyCODONE (ROXICODONE) 5 MG immediate release tablet  Every 4 hours PRN        01/11/20 0438    Ambulatory referral to Neurology       Comments: An appointment is requested in approximately: 2 WEEKS}   01/11/20 0438           Orpah Greek, MD 01/11/20 [phone]

## 2020-01-12 ENCOUNTER — Telehealth: Payer: Self-pay

## 2020-01-12 NOTE — Telephone Encounter (Signed)
Dr. Harley Alto is on pal. Please review and advise:  [name] was seen in the ED this past weekend. Dx. Altered Mental Status.    [name] his wife stated his back pain has moved to his right lower abdomen area & right upper thigh. She is concerned about an Abdominal  Aneurysm. She would like someone to look at the recent ED notes and test and advise follow up.    Patient will see his PCP on this Friday. And will have an  MRI on Thursday ordered by Dr. Ranell Patrick. Please advise.   Call back phone number [phone] ask for [name].

## 2020-01-12 NOTE — Telephone Encounter (Signed)
[name] called back:   [name] seems to be getting worst. He now c/o neck pain and having problems getting around. His PCP has not returned her call.    I advised her to take him to the nearest Urgent Care. If she does not want to take him to back to the ED.    Please advise.

## 2020-01-12 NOTE — Telephone Encounter (Signed)
For documentation purposes, I advised pt's wife to call EMS, since he's confused and refusing to go- he's too confused to make decisions, based on the notes, and needs to be seen in ER.

## 2020-01-12 NOTE — Telephone Encounter (Signed)
Dr. Dagoberto Ligas stated ER maybe a better option at this point. Mrs. Every has been advised,  but he does not want to go. I suggested she call EMS. If he does not want to go back to the ER.   She understood.

## 2020-01-14 ENCOUNTER — Other Ambulatory Visit: Payer: Self-pay

## 2020-01-14 ENCOUNTER — Encounter (HOSPITAL_COMMUNITY): Payer: Self-pay

## 2020-01-14 ENCOUNTER — Ambulatory Visit (HOSPITAL_COMMUNITY)
Admission: RE | Admit: 2020-01-14 | Discharge: 2020-01-14 | Disposition: A | Discharge status: Home / Self Care | Payer: PPO | Source: Ambulatory Visit | Attending: Physical Medicine and Rehabilitation | Admitting: Physical Medicine and Rehabilitation

## 2020-01-14 DIAGNOSIS — M47816 Spondylosis without myelopathy or radiculopathy, lumbar region: Secondary | ICD-10-CM

## 2020-01-15 ENCOUNTER — Ambulatory Visit
Admission: RE | Admit: 2020-01-15 | Discharge: 2020-01-15 | Disposition: A | Discharge status: Home / Self Care | Payer: PPO | Source: Ambulatory Visit | Attending: Family Medicine | Admitting: Family Medicine

## 2020-01-15 ENCOUNTER — Ambulatory Visit (INDEPENDENT_AMBULATORY_CARE_PROVIDER_SITE_OTHER): Payer: PPO | Admitting: Family Medicine

## 2020-01-15 VITALS — BP 140/70 | HR 92 | Temp 97.7°F | Ht 67.0 in | Wt 175.0 lb

## 2020-01-15 DIAGNOSIS — S069X1D Unspecified intracranial injury with loss of consciousness of 30 minutes or less, subsequent encounter: Secondary | ICD-10-CM | POA: Diagnosis not present

## 2020-01-15 DIAGNOSIS — M542 Cervicalgia: Secondary | ICD-10-CM | POA: Diagnosis not present

## 2020-01-15 DIAGNOSIS — R413 Other amnesia: Secondary | ICD-10-CM

## 2020-01-15 DIAGNOSIS — R7989 Other specified abnormal findings of blood chemistry: Secondary | ICD-10-CM

## 2020-01-15 IMAGING — CR DG CERVICAL SPINE COMPLETE 4+V
7 series · 7 of 7 positions shown · non-contrast
Comparison: [DATE] [DATE], [DATE].  [DATE] [DATE], [DATE].

CLINICAL DATA: Neck pain after fall 3 weeks ago.

EXAM:
CERVICAL SPINE - COMPLETE 4+ VIEW

[w cervical spine lat]
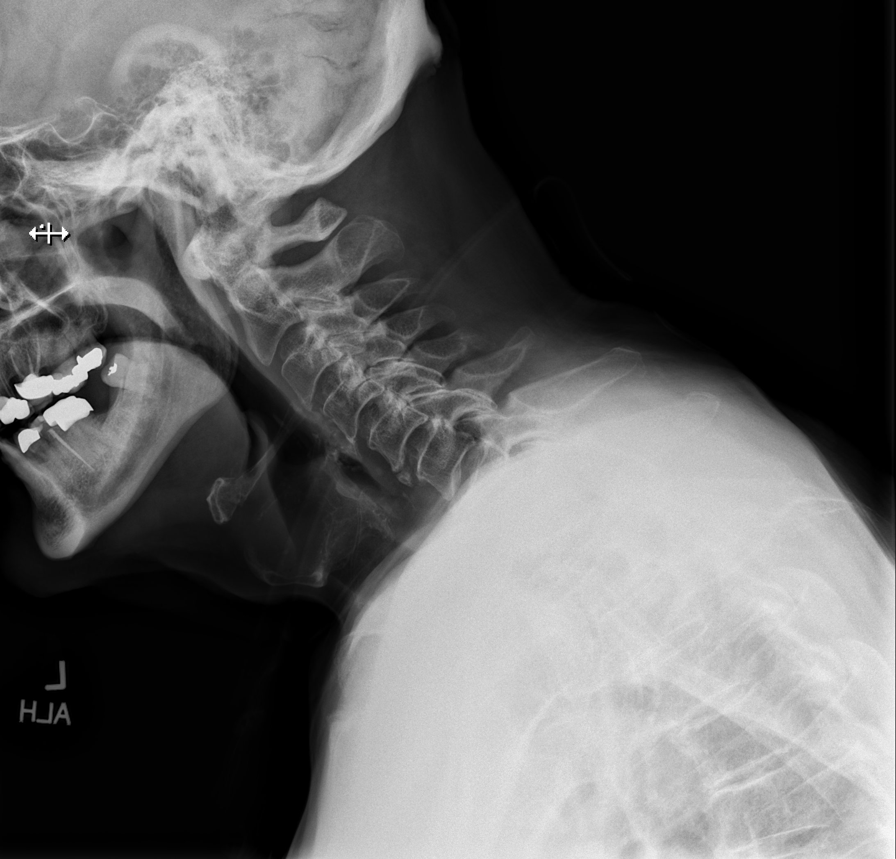

[w cervical spine ap_obl (1 of 2)]
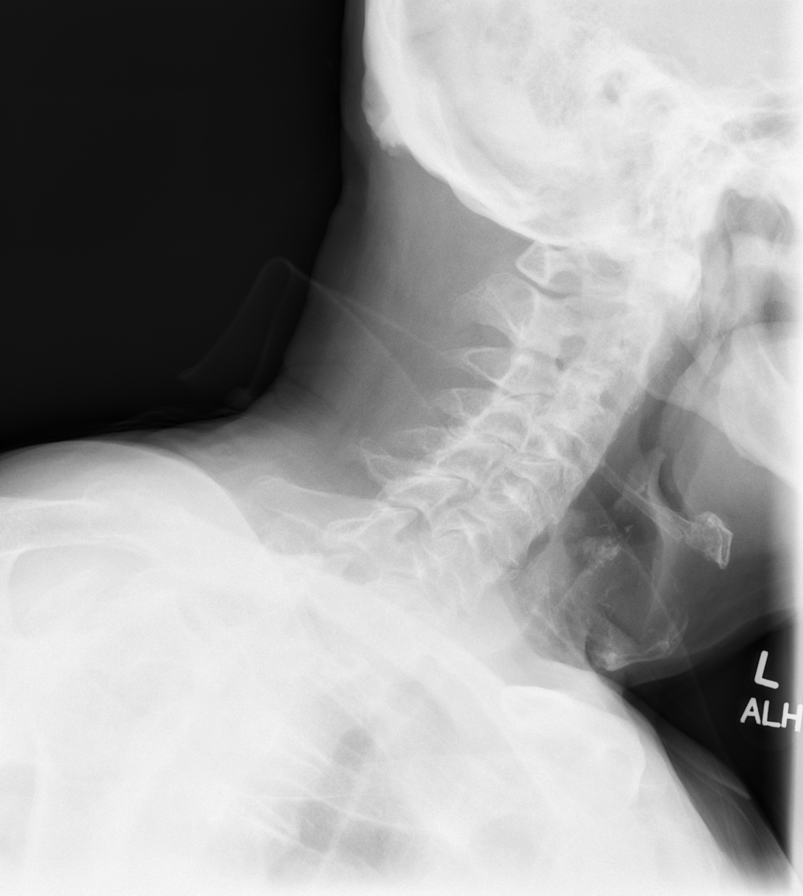

[w cervical spine ap_obl (2 of 2)]
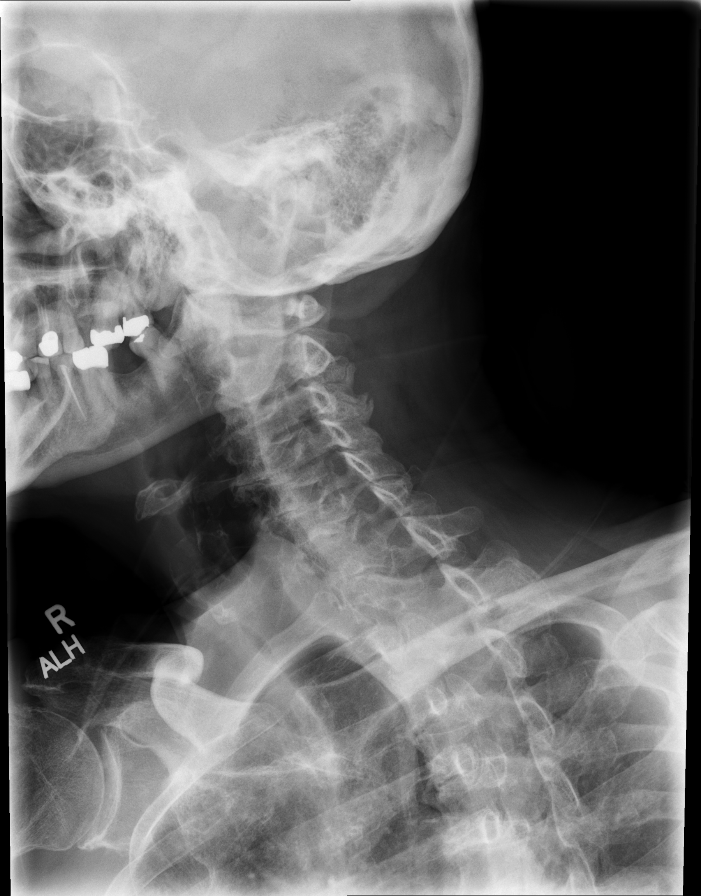

[w cervical spine ap]
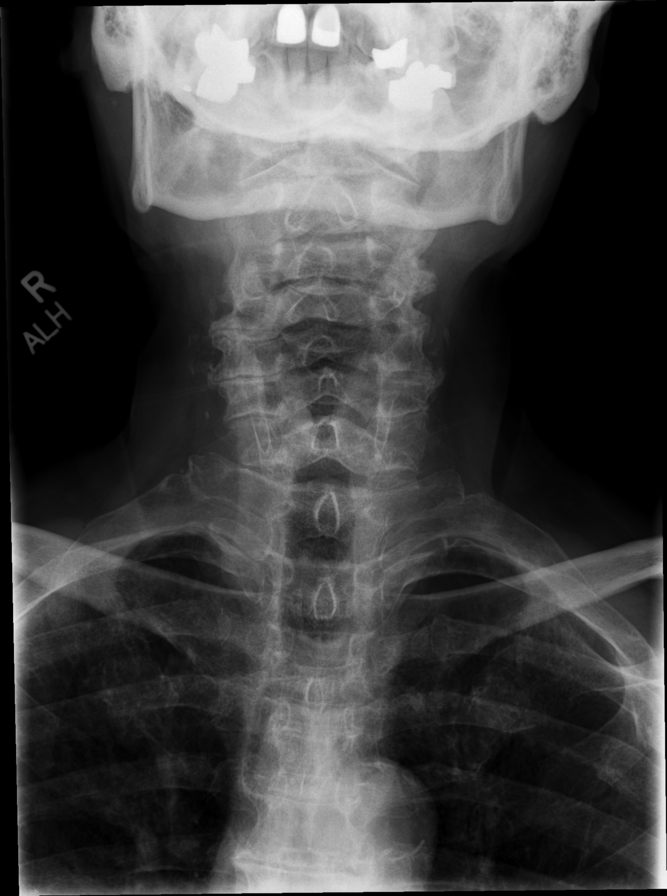

[w cervical spine odontoid (1 of 2)]
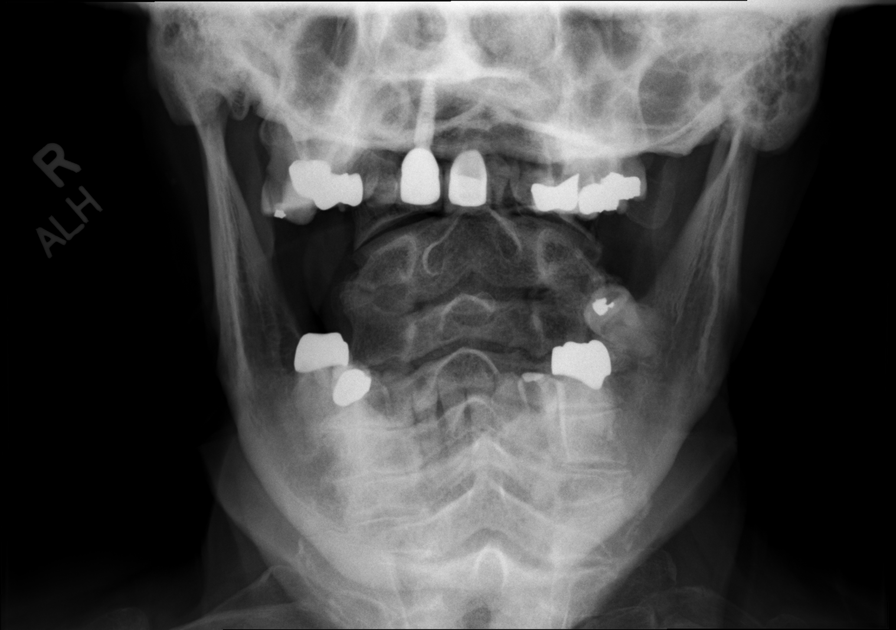

[w cervical spine odontoid (2 of 2)]
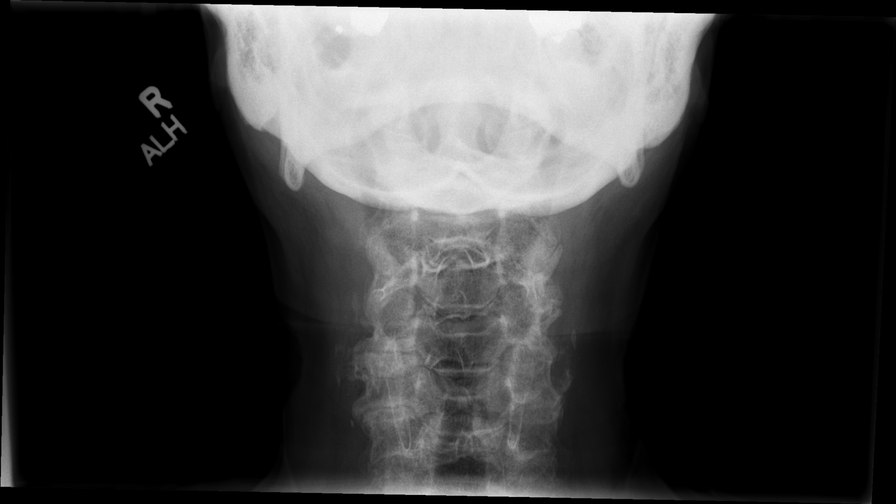

[w cervical swimmers]
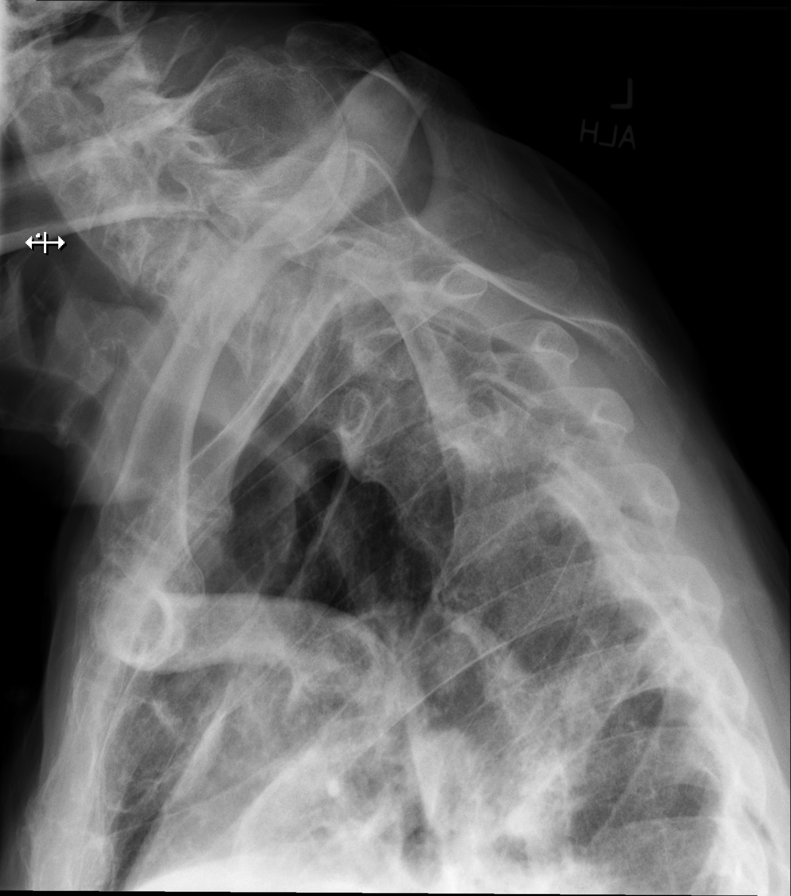

[7 of 7 positions shown; findings below may reference images not displayed]

FINDINGS: Minimal grade 1 anterolisthesis is noted at C5-6 secondary to
posterior facet joint hypertrophy. No definite fracture is noted.
Degenerative changes seen involving posterior facet joints
bilaterally at multiple levels. Mild degenerative disc disease is
noted at C5-6 and C6-7.
IMPRESSION: Mild multilevel degenerative disc disease. No acute abnormality seen
in the cervical spine.

## 2020-01-15 MED ORDER — PREGABALIN 50 MG PO CAPS: 50.0000 mg | ORAL_CAPSULE | Freq: Three times a day (TID) | ORAL | 0 refills | Status: DC

## 2020-01-15 NOTE — Progress Notes (Signed)
Subjective:    Patient ID: [name], male    DOB: [birth year], 86 y.o.   MRN: [id number]  HPI  08/29/19 Patient is a very pleasant 86 year old Caucasian male who presents today for hospital discharge follow-up.  Patient was walking up a set of steps at his home on June 8.  He has no recollection of the injury however his wife was home and heard him stumble.  He fell down 12 steps striking his head against the wall and the steps as he fell.  He was extremely confused and lost consciousness on the scene.  Wife witnessed convulsions.  Was taken directly to the emergency room.  Patient was found at that time to have a small subarachnoid hemorrhage.  He was also found to have 7 rib fractures as well as a left clavicular fracture and a broken thumb.  Patient went closed reduction and splinting of the broken thumb.  He was treated with nonweightbearing sling in his left upper extremity for the clavicular fracture.  This has since been mobilized to allow some range of motion and the patient is not wearing a sling today.  The rib fractures were allowed to heal spontaneously.  Patient was discharged from the hospital July 2 after being admitted to a rehab.  Patient presented back to the hospital with confusion and fever on July 13.  White blood cell count was found to be 15.  Patient was found to have a left-sided pleural effusion and consolidation.  He was started on doxycycline for presumed pneumonia.  Since that time he is afebrile.  He has not had his white blood cell count rechecked.  He has not had a repeat chest x-ray.  His wife states that his mental status has improved.  He is close to his baseline although at times he is still disoriented.  His balance has improved although he is still a high fall risk.  He stood today to allow me to examine him and he staggered and had to hold onto his walker in the table to maintain balance.  Patient had a repeat head CT on July 13 as well as July 15 which showed a  left-sided subdural hematoma.  Initial measurements were 8 mm on the 13th and 6 mm on the 15th.  Patient denies any headache.  He denies any confusion.  His wife states that his confusion has improved dramatically after he stopped prednisone and amitriptyline.  Per his report he was given amitriptyline to help him sleep.  He was given prednisone for "arthritis in his back".  The patient states that he was having significant pain in his lower back with aching and throbbing into his gluteus muscles bilaterally.  He had an x-ray of the lumbar spine during his hospitalization that showed mild degenerative changes but no acute fracture.  He denies any saddle anesthesia.  He denies any bowel or bladder incontinence.  He denies any leg weakness or leg numbness.  During his initial hospitalization he did have urinary retention and required a catheter and bethanechol.  However he was able to wean off this and is currently only on his doxazosin.  He is still holding his aspirin.  At that time, my plan was: My biggest concern is his recent fever, elevated white blood cell count, and left-sided pleural effusion.  He has been on doxycycline since the 15th.  He continues to report pain in his left chest that is getting worse.  However he believes this is due to  the rib fracture.  He denies any cough or hemoptysis or fever.  He does report pleurisy.  Therefore I would like to repeat the chest x-ray to ensure that the pleural effusion is not increasing and that there is not evidence of an empyema or worsening infection.  If the chest x-ray is stable, I plan to see the patient back next week for reevaluation.  Also repeat a CBC to monitor his leukocytosis.  Regarding his traumatic brain injury and subdural hematoma, patient is clinically stable having recently had a CAT scan.  I would repeat CT scan if there is clinical deterioration, worsening confusion, or ataxia.  Otherwise recheck the patient clinically next week.  Monitor his  chronic kidney disease with a BMP.  Rib fracture should heal spontaneously.  Thumb fracture and clavicular fracture being managed by orthopedics.  Continue to hold aspirin until balance has improved and subdural hematoma is resolving.  Hold beta-blocker at the present time as blood pressure is just now starting to increase.  Recheck blood pressure next week.  09/04/19 Chest x-ray showed no change.  White blood cell count had returned to normal.  Therefore there was no evidence of persistent pneumonia or worsening pleural effusion.  The patient continues to have pain over his ribs on his lower left side consistent with his previous rib fracture.  He does have some mild pleurisy but denies any shortness of breath or hemoptysis.  He denies any fever however he is having soaking night sweats every evening to the point that it soaks the sheets.  This concerns me.  He is also having hyperesthesias in his lower back roughly at the level of L4.  The skin in that area is extremely sensitive to the touch with no visible rash or abnormality.  He reports burning and stinging paresthesias in that area.  X-ray obtained in June showed degenerative disc disease but was otherwise negative for any acute injury.  I question if this may be related to some of his night sweats.  Patient continues to have issues with focus and concentration.  He is repeating questions that he just ask.  He is also having mood swings and is occasionally down and sad.  This is never been an issue for him before and I believe all are sequela of his traumatic brain injury.  He denies any hallucinations.  He is able to perform serial sevens today without any difficulty.  Short-term recall seems to be excellent.  At that time, my plan was: Biggest concern of the night sweats potentially related to an infection or malignancy.  Begin by rechecking his white blood cell count.  If normal, I would recommend an MRI of the lumbar spine given the worsening  paresthesias and hyperesthesias he is experiencing around the lower lumbar spine on the left.  Consider work-up for malignancy.  I believe the pleural effusion is stable.  Continue to hold aspirin as his balance is still poor.  Continue to hold Lotrel as his blood pressure remains well controlled.  Discontinue Breo as I do not believe the patient requires this medication  01/15/20 I have not seen the patient since July.  However in the interval time, he is fallen 2 additional times.  He struck his head once in November and suffered a concussion.  CT scan of the head at that time showed no intracranial bleed.  He also fell again in December also striking his head.  He presents today at my request for follow-up.  I had  noticed on recent lab work that his liver function tests were elevated.  Last imaging of the abdomen was in July and at that time there was a 15 mm lesion in the pancreas that has not been fully worked up.  No other imaging has been performed.  He is now off his statin.  In addition to his elevated liver function test he also reports hyperesthesias in the right side of his neck.  He screams and winces simply to have me gently touch the skin on the right side of his neck over the trapezius muscle.  There is no tenderness to palpation over the spinous processes of the cervical spine.  Flexion and extension does not elicit pain however rightward rotation elicits pain.  Pain is out of proportion to exam.  He also appears to have increasing confusion and balance issues.  I performed a Mini-Mental status exam today.  He is only able to remember 2 out of 3 objects on recall.  He is only able to perform 2 out of 5 calculations on serial sevens.  He has a difficult time telling me the date as he gets confused on the year.  He is able to spell world in reverse.  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 Past Medical History:   Diagnosis Date  . Allergy   . BPH (benign prostatic hyperplasia)   . BPH (benign prostatic hypertrophy)   . CAD (coronary artery disease)    s/p cypher DES to pLAD 6/08; normal LVF;  ETT-Myoview 2009: no ischemia   . Coronary artery disease   . Eosinophilia   . High cholesterol   . HTN (hypertension)   . Hyperlipidemia   . Hypertension   . MI (myocardial infarction) ([place])   . Myocardial infarction ([place])   . Prediabetes   . Trigeminal neuralgia    Past Surgical History:  Procedure Laterality Date  . APPENDECTOMY    . CARDIAC CATHETERIZATION  07/30/2006   CORONARY ANGIOPLASTY WITH STENT PLACEMENT  . CARDIAC CATHETERIZATION    . EXPLORATORY LAPAROTOMY     age 47  . EXPLORATORY LAPAROTOMY    . IR THORACENTESIS ASP PLEURAL SPACE W/IMG GUIDE  10/05/2019   Current Outpatient Medications on File Prior to Visit  Medication Sig Dispense Refill  . acetaminophen (TYLENOL) 500 MG tablet Take 1,000 mg by mouth every 6 (six) hours as needed for mild pain, moderate pain or headache.     . albuterol (PROAIR HFA) 108 (90 Base) MCG/ACT inhaler INHALE 2 PUFFS INTO THE LUNGS EVERY 6 HOURS AS NEEDED FOR WHEEZING OR SHORTNESS OF BREATH 6.7 g 0  . amLODipine (NORVASC) 5 MG tablet Take 1 tablet (5 mg total) by mouth daily. 90 tablet 3  . Apoaequorin (PREVAGEN) 10 MG CAPS Take 10 mg by mouth daily.    Marland Kitchen BREO ELLIPTA 200-25 MCG/INH AEPB INHALE 1 PUFF BY MOUTH EVERY DAY 60 each 1  . Calcium Carb-Cholecalciferol (CALCIUM 600+D3 PO) Take 1-2 tablets by mouth daily.    . clarithromycin (BIAXIN) 500 MG tablet Take 1 tablet (500 mg total) by mouth 2 (two) times daily. 20 tablet 0  . doxazosin (CARDURA) 2 MG tablet TAKE 1 TABLET BY MOUTH EVERY DAY 90 tablet 3  . escitalopram (LEXAPRO) 10 MG tablet TAKE 1 TABLET BY MOUTH EVERY DAY 90 tablet 2  . ezetimibe (ZETIA) 10 MG tablet TAKE 1 TABLET BY MOUTH EVERY  DAY 90 tablet 3  . lidocaine (LIDODERM) 5 % Place 1 patch onto the skin daily. Remove & Discard patch within  12 hours or as directed by MD 30 patch 0  . loratadine (CLARITIN) 10 MG tablet Take 10 mg by mouth daily as needed for allergies or rhinitis.     . Multiple Vitamin (MULTIVITAMIN WITH MINERALS) TABS tablet Take 1 tablet by mouth daily.    Marland Kitchen oxyCODONE (ROXICODONE) 5 MG immediate release tablet Take 0.5-1 tablets (2.5-5 mg total) by mouth every 4 (four) hours as needed for severe pain. 20 tablet 0  . atorvastatin (LIPITOR) 20 MG tablet Take 1 tablet (20 mg total) by mouth daily. (Patient not taking: Reported on 01/15/2020) 90 tablet 1   No current facility-administered medications on file prior to visit.   Allergies  Allergen Reactions  . Gabapentin Other (See Comments)    Visual changes and confusion- "I went blind while I was driving"   Social History   Socioeconomic History  . Marital status: Married    Spouse name: [name]  . Number of children: 1  . Years of education: Not on file  . Highest education level: Not on file  Occupational History  . Occupation: retired professor    Comment: History   . Occupation: missionary  Tobacco Use  . Smoking status: Never Smoker  . Smokeless tobacco: Never Used  . Tobacco comment: quit over 68yrs ago.  Substance and Sexual Activity  . Alcohol use: Never  . Drug use: Never  . Sexual activity: Yes    Partners: Female  Other Topics Concern  . Not on file  Social History Narrative   ** Merged History Encounter **       Lives with his wife (second marriage in 2015, first marriage ended when his wife died alzheimer's disease). Since his remarriage, his adult daughter doesn't speak with him.   Social Determinants of Health   Financial Resource Strain: Not on file  Food Insecurity: Not on file  Transportation Needs: Not on file  Physical Activity: Not on file  Stress: Not on file  Social Connections: Not on file  Intimate Partner Violence: Not on file   Family History  Problem Relation Age of Onset  . Stomach cancer Mother   . Heart  disease Father   . Heart disease Brother   . Aortic aneurysm Brother   . Colon cancer Brother   . Coronary artery disease Father 71  . Coronary artery disease Brother   . Aortic aneurysm Brother   . Colon cancer Brother   . Esophageal cancer Neg Hx   . Rectal cancer Neg Hx   . Arthritis Daughter        rheumatoid     Review of Systems  All other systems reviewed and are negative.      Objective:   Physical Exam Vitals reviewed.  Constitutional:      General: He is not in acute distress.    Appearance: He is well-developed. He is not diaphoretic.  HENT:     Head: Normocephalic and atraumatic.     Right Ear: External ear normal.     Left Ear: External ear normal.     Nose: Nose normal.     Mouth/Throat:     Pharynx: No oropharyngeal exudate.  Eyes:     General: No scleral icterus.       Right eye: No discharge.        Left eye: No discharge.  Conjunctiva/sclera: Conjunctivae normal.     Pupils: Pupils are equal, round, and reactive to light.  Neck:     Thyroid: No thyromegaly.     Vascular: No JVD.     Trachea: No tracheal deviation.   Cardiovascular:     Rate and Rhythm: Normal rate and regular rhythm.     Heart sounds: Normal heart sounds. No murmur heard. No friction rub. No gallop.   Pulmonary:     Effort: Pulmonary effort is normal. No accessory muscle usage, prolonged expiration or respiratory distress.     Breath sounds: No stridor. No wheezing or rales.  Abdominal:     General: Bowel sounds are normal. There is no distension.     Palpations: Abdomen is soft. There is no mass.     Tenderness: There is no abdominal tenderness. There is no guarding or rebound.  Musculoskeletal:        General: No tenderness.     Cervical back: Neck supple. No erythema, rigidity, torticollis or crepitus. Pain with movement and muscular tenderness present. No spinous process tenderness. Decreased range of motion.  Lymphadenopathy:     Cervical: No cervical adenopathy.   Skin:    General: Skin is warm.     Coloration: Skin is not pale.     Findings: No erythema or rash.  Neurological:     Mental Status: He is alert and oriented to person, place, and time.     Cranial Nerves: No cranial nerve deficit.     Motor: No abnormal muscle tone.     Coordination: Coordination abnormal.     Gait: Gait abnormal.     Deep Tendon Reflexes: Reflexes are normal and symmetric.  Psychiatric:        Behavior: Behavior normal.        Thought Content: Thought content normal.        Judgment: Judgment normal.           Assessment & Plan:  Neck pain - Plan: DG Cervical Spine Complete  Elevated LFTs  Traumatic brain injury, with loss of consciousness of 30 minutes or less, subsequent encounter  Memory loss  Hyperesthesias suggest neuropathic cause of the neck pain.  Obtain x-ray of the cervical spine to ensure that the patient has not suffered any neck injury with his recent falls.  I suspect nerve impingement.  Begin Lyrica 50 mg p.o. 3 times daily as needed nerve pain.  Recheck the patient next week to ensure that this is not exacerbating his memory loss or confusion.  I am concerned about his elevated LFTs particular given the lesion that was initially seen in his pancreas.  Therefore I will also order an US of the abdomen and pelvis to reassess the lesion in the pancreas and also to look at the liver to see if there is an explanation for the elevated liver function test.  I am concerned that his memory loss may be due to concussions also I am concerned about possible underlying dementia.  Reassess the patient next week to see if the Lyrica is helping with the pain and to go over the results of the [place]

## 2020-01-15 NOTE — Telephone Encounter (Signed)
[name] was advised.

## 2020-01-18 ENCOUNTER — Telehealth: Payer: Self-pay | Admitting: *Deleted

## 2020-01-18 NOTE — Telephone Encounter (Signed)
Mrs [name] called to report that Mr [name] saw his PCP DR [name] 01/15/20.  He had his MRI on Thursday but they were unable to see results.  He is supposed to see DR [name] again tomorrow. Mrs [name] is wanting to know if there is any way that the results can be ready by then. She also wants Dr [name] opinion of him being put on lyrica by Dr [name].  He had another fall in the house ( his third) and was unhurt but EMS had to come help get him up off the floor.   Please give a call back or advise. # [phone].

## 2020-01-19 ENCOUNTER — Other Ambulatory Visit: Payer: Self-pay

## 2020-01-19 ENCOUNTER — Ambulatory Visit (INDEPENDENT_AMBULATORY_CARE_PROVIDER_SITE_OTHER): Payer: PPO | Admitting: Family Medicine

## 2020-01-19 VITALS — BP 110/70 | HR 76 | Temp 97.7°F | Ht 67.0 in | Wt 178.0 lb

## 2020-01-19 DIAGNOSIS — M5442 Lumbago with sciatica, left side: Secondary | ICD-10-CM

## 2020-01-19 DIAGNOSIS — S069X1D Unspecified intracranial injury with loss of consciousness of 30 minutes or less, subsequent encounter: Secondary | ICD-10-CM | POA: Diagnosis not present

## 2020-01-19 DIAGNOSIS — M542 Cervicalgia: Secondary | ICD-10-CM

## 2020-01-19 DIAGNOSIS — R413 Other amnesia: Secondary | ICD-10-CM

## 2020-01-19 DIAGNOSIS — R7989 Other specified abnormal findings of blood chemistry: Secondary | ICD-10-CM | POA: Diagnosis not present

## 2020-01-19 NOTE — Telephone Encounter (Signed)
Calling now

## 2020-01-19 NOTE — Progress Notes (Signed)
Subjective:    Patient ID: [name], male    DOB: 28-Sep-1933, 86 y.o.   MRN: [id number]  HPI  08/29/19 Patient is a very pleasant 86 year old Caucasian male who presents today for hospital discharge follow-up.  Patient was walking up a set of steps at his home on June 8.  He has no recollection of the injury however his wife was home and heard him stumble.  He fell down 12 steps striking his head against the wall and the steps as he fell.  He was extremely confused and lost consciousness on the scene.  Wife witnessed convulsions.  Was taken directly to the emergency room.  Patient was found at that time to have a small subarachnoid hemorrhage.  He was also found to have 7 rib fractures as well as a left clavicular fracture and a broken thumb.  Patient went closed reduction and splinting of the broken thumb.  He was treated with nonweightbearing sling in his left upper extremity for the clavicular fracture.  This has since been mobilized to allow some range of motion and the patient is not wearing a sling today.  The rib fractures were allowed to heal spontaneously.  Patient was discharged from the hospital July 2 after being admitted to a rehab.  Patient presented back to the hospital with confusion and fever on July 13.  White blood cell count was found to be 15.  Patient was found to have a left-sided pleural effusion and consolidation.  He was started on doxycycline for presumed pneumonia.  Since that time he is afebrile.  He has not had his white blood cell count rechecked.  He has not had a repeat chest x-ray.  His wife states that his mental status has improved.  He is close to his baseline although at times he is still disoriented.  His balance has improved although he is still a high fall risk.  He stood today to allow me to examine him and he staggered and had to hold onto his walker in the table to maintain balance.  Patient had a repeat head CT on July 13 as well as July 15 which showed a  left-sided subdural hematoma.  Initial measurements were 8 mm on the 13th and 6 mm on the 15th.  Patient denies any headache.  He denies any confusion.  His wife states that his confusion has improved dramatically after he stopped prednisone and amitriptyline.  Per his report he was given amitriptyline to help him sleep.  He was given prednisone for "arthritis in his back".  The patient states that he was having significant pain in his lower back with aching and throbbing into his gluteus muscles bilaterally.  He had an x-ray of the lumbar spine during his hospitalization that showed mild degenerative changes but no acute fracture.  He denies any saddle anesthesia.  He denies any bowel or bladder incontinence.  He denies any leg weakness or leg numbness.  During his initial hospitalization he did have urinary retention and required a catheter and bethanechol.  However he was able to wean off this and is currently only on his doxazosin.  He is still holding his aspirin.  At that time, my plan was: My biggest concern is his recent fever, elevated white blood cell count, and left-sided pleural effusion.  He has been on doxycycline since the 15th.  He continues to report pain in his left chest that is getting worse.  However he believes this is due to  the rib fracture.  He denies any cough or hemoptysis or fever.  He does report pleurisy.  Therefore I would like to repeat the chest x-ray to ensure that the pleural effusion is not increasing and that there is not evidence of an empyema or worsening infection.  If the chest x-ray is stable, I plan to see the patient back next week for reevaluation.  Also repeat a CBC to monitor his leukocytosis.  Regarding his traumatic brain injury and subdural hematoma, patient is clinically stable having recently had a CAT scan.  I would repeat CT scan if there is clinical deterioration, worsening confusion, or ataxia.  Otherwise recheck the patient clinically next week.  Monitor his  chronic kidney disease with a BMP.  Rib fracture should heal spontaneously.  Thumb fracture and clavicular fracture being managed by orthopedics.  Continue to hold aspirin until balance has improved and subdural hematoma is resolving.  Hold beta-blocker at the present time as blood pressure is just now starting to increase.  Recheck blood pressure next week.  09/04/19 Chest x-ray showed no change.  White blood cell count had returned to normal.  Therefore there was no evidence of persistent pneumonia or worsening pleural effusion.  The patient continues to have pain over his ribs on his lower left side consistent with his previous rib fracture.  He does have some mild pleurisy but denies any shortness of breath or hemoptysis.  He denies any fever however he is having soaking night sweats every evening to the point that it soaks the sheets.  This concerns me.  He is also having hyperesthesias in his lower back roughly at the level of L4.  The skin in that area is extremely sensitive to the touch with no visible rash or abnormality.  He reports burning and stinging paresthesias in that area.  X-ray obtained in June showed degenerative disc disease but was otherwise negative for any acute injury.  I question if this may be related to some of his night sweats.  Patient continues to have issues with focus and concentration.  He is repeating questions that he just ask.  He is also having mood swings and is occasionally down and sad.  This is never been an issue for him before and I believe all are sequela of his traumatic brain injury.  He denies any hallucinations.  He is able to perform serial sevens today without any difficulty.  Short-term recall seems to be excellent.  At that time, my plan was: Biggest concern of the night sweats potentially related to an infection or malignancy.  Begin by rechecking his white blood cell count.  If normal, I would recommend an MRI of the lumbar spine given the worsening  paresthesias and hyperesthesias he is experiencing around the lower lumbar spine on the left.  Consider work-up for malignancy.  I believe the pleural effusion is stable.  Continue to hold aspirin as his balance is still poor.  Continue to hold Lotrel as his blood pressure remains well controlled.  Discontinue Breo as I do not believe the patient requires this medication  01/15/20 I have not seen the patient since July.  However in the interval time, he is fallen 2 additional times.  He struck his head once in November and suffered a concussion.  CT scan of the head at that time showed no intracranial bleed.  He also fell again in December also striking his head.  He presents today at my request for follow-up.  I had  noticed on recent lab work that his liver function tests were elevated.  Last imaging of the abdomen was in July and at that time there was a 15 mm lesion in the pancreas that has not been fully worked up.  No other imaging has been performed.  He is now off his statin.  In addition to his elevated liver function test he also reports hyperesthesias in the right side of his neck.  He screams and winces simply to have me gently touch the skin on the right side of his neck over the trapezius muscle.  There is no tenderness to palpation over the spinous processes of the cervical spine.  Flexion and extension does not elicit pain however rightward rotation elicits pain.  Pain is out of proportion to exam.  He also appears to have increasing confusion and balance issues.  I performed a Mini-Mental status exam today.  He is only able to remember 2 out of 3 objects on recall.  He is only able to perform 2 out of 5 calculations on serial sevens.  He has a difficult time telling me the date as he gets confused on the year.  He is able to spell world in reverse.  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  At that time, my plan  was: Hyperesthesias suggest neuropathic cause of the neck pain.  Obtain x-ray of the cervical spine to ensure that the patient has not suffered any neck injury with his recent falls.  I suspect nerve impingement.  Begin Lyrica 50 mg p.o. 3 times daily as needed nerve pain.  Recheck the patient next week to ensure that this is not exacerbating his memory loss or confusion.  I am concerned about his elevated LFTs particular given the lesion that was initially seen in his pancreas.  Therefore I will also order an US of the abdomen and pelvis to reassess the lesion in the pancreas and also to look at the liver to see if there is an explanation for the elevated liver function test.  I am concerned that his memory loss may be due to concussions also I am concerned about possible underlying dementia.  Reassess the patient next week to see if the Lyrica is helping with the pain and to go over the results of the [place]  01/19/20 Minimal grade 1 anterolisthesis is noted at C5-6 secondary to posterior facet joint hypertrophy. No definite fracture is noted. Degenerative changes seen involving posterior facet joints bilaterally at multiple levels. Mild degenerative disc disease is noted at C5-6 and C6-7.  Thankfully, the patient states that the pain on the right side of his neck is much better on the Lyrica. However his wife is accompanying him today and based on her facial expression I have a feeling that the patient is not being entirely accurate. I am concerned that his memory loss and confusion may be affecting his short-term memory. She thinks that the medicine is helping some. He is no longer screaming in pain. However she states that the medication also appears to be making him more confused may be making him less steady on his feet. He fell an additional time over the weekend. This would make the third time in the last few months that he is fallen. Today he has a difficult time remembering our conversation from  Friday. He also seems to be confused about the medication that he  is taking. This is definitely not the patient's baseline. 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 I believe that there may be an underlying dementia given the progressive decline since the summer although he has had several concussions in that exact same timeframe. He has an appointment Thursday to get an ultrasound of his liver to reevaluate due to the elevated liver function test. Both he and his wife are adamant that he had an MRI of the lumbar spine however we called the radiology department and they have no record of him coming. They state that he canceled his appointment. They state that no MRI was performed and therefore there is no read of the MRI to review.  Past Medical History:  Diagnosis Date  . Allergy   . BPH (benign prostatic hyperplasia)   . BPH (benign prostatic hypertrophy)   . CAD (coronary artery disease)    s/p cypher DES to pLAD 6/08; normal LVF;  ETT-Myoview 2009: no ischemia   . Coronary artery disease   . Eosinophilia   . High cholesterol   . HTN (hypertension)   . Hyperlipidemia   . Hypertension   . MI (myocardial infarction) ([place])   . Myocardial infarction ([place])   . Prediabetes   . Trigeminal neuralgia    Past Surgical History:  Procedure Laterality Date  . APPENDECTOMY    . CARDIAC CATHETERIZATION  07/30/2006   CORONARY ANGIOPLASTY WITH STENT PLACEMENT  . CARDIAC CATHETERIZATION    . EXPLORATORY LAPAROTOMY     age 29  . EXPLORATORY LAPAROTOMY    . IR THORACENTESIS ASP PLEURAL SPACE W/IMG GUIDE  10/05/2019   Current Outpatient Medications on File Prior to Visit  Medication Sig Dispense Refill  . acetaminophen (TYLENOL) 500 MG tablet Take 1,000 mg by mouth every 6 (six) hours as needed for mild pain, moderate pain or headache.     . albuterol (PROAIR HFA) 108 (90 Base) MCG/ACT inhaler INHALE 2 PUFFS INTO THE LUNGS EVERY 6  HOURS AS NEEDED FOR WHEEZING OR SHORTNESS OF BREATH 6.7 g 0  . amLODipine (NORVASC) 5 MG tablet Take 1 tablet (5 mg total) by mouth daily. 90 tablet 3  . Apoaequorin (PREVAGEN) 10 MG CAPS Take 10 mg by mouth daily.    Marland Kitchen atorvastatin (LIPITOR) 20 MG tablet Take 1 tablet (20 mg total) by mouth daily. (Patient not taking: Reported on 01/15/2020) 90 tablet 1  . BREO ELLIPTA 200-25 MCG/INH AEPB INHALE 1 PUFF BY MOUTH EVERY DAY 60 each 1  . Calcium Carb-Cholecalciferol (CALCIUM 600+D3 PO) Take 1-2 tablets by mouth daily.    . clarithromycin (BIAXIN) 500 MG tablet Take 1 tablet (500 mg total) by mouth 2 (two) times daily. 20 tablet 0  . doxazosin (CARDURA) 2 MG tablet TAKE 1 TABLET BY MOUTH EVERY DAY 90 tablet 3  . escitalopram (LEXAPRO) 10 MG tablet TAKE 1 TABLET BY MOUTH EVERY DAY 90 tablet 2  . ezetimibe (ZETIA) 10 MG tablet TAKE 1 TABLET BY MOUTH EVERY DAY 90 tablet 3  . lidocaine (LIDODERM) 5 % Place 1 patch onto the skin daily. Remove & Discard patch within 12 hours or as directed by MD 30 patch 0  . loratadine (CLARITIN) 10 MG tablet Take 10 mg by mouth daily as needed for allergies or rhinitis.     . Multiple Vitamin (MULTIVITAMIN WITH MINERALS) TABS tablet Take 1 tablet by mouth daily.    Marland Kitchen oxyCODONE (ROXICODONE) 5 MG immediate release tablet  Take 0.5-1 tablets (2.5-5 mg total) by mouth every 4 (four) hours as needed for severe pain. 20 tablet 0  . pregabalin (LYRICA) 50 MG capsule Take 1 capsule (50 mg total) by mouth 3 (three) times daily. 90 capsule 0   No current facility-administered medications on file prior to visit.   Allergies  Allergen Reactions  . Gabapentin Other (See Comments)    Visual changes and confusion- "I went blind while I was driving"   Social History   Socioeconomic History  . Marital status: Married    Spouse name: [name]  . Number of children: 1  . Years of education: Not on file  . Highest education level: Not on file  Occupational History  . Occupation:  retired professor    Comment: History   . Occupation: missionary  Tobacco Use  . Smoking status: Never Smoker  . Smokeless tobacco: Never Used  . Tobacco comment: quit over 79yrs ago.  Substance and Sexual Activity  . Alcohol use: Never  . Drug use: Never  . Sexual activity: Yes    Partners: Female  Other Topics Concern  . Not on file  Social History Narrative   ** Merged History Encounter **       Lives with his wife (second marriage in 2015, first marriage ended when his wife died alzheimer's disease). Since his remarriage, his adult daughter doesn't speak with him.   Social Determinants of Health   Financial Resource Strain: Not on file  Food Insecurity: Not on file  Transportation Needs: Not on file  Physical Activity: Not on file  Stress: Not on file  Social Connections: Not on file  Intimate Partner Violence: Not on file   Family History  Problem Relation Age of Onset  . Stomach cancer Mother   . Heart disease Father   . Heart disease Brother   . Aortic aneurysm Brother   . Colon cancer Brother   . Coronary artery disease Father 21  . Coronary artery disease Brother   . Aortic aneurysm Brother   . Colon cancer Brother   . Esophageal cancer Neg Hx   . Rectal cancer Neg Hx   . Arthritis Daughter        rheumatoid     Review of Systems  All other systems reviewed and are negative.      Objective:   Physical Exam Vitals reviewed.  Constitutional:      General: He is not in acute distress.    Appearance: He is well-developed. He is not diaphoretic.  HENT:     Head: Normocephalic and atraumatic.     Right Ear: External ear normal.     Left Ear: External ear normal.     Nose: Nose normal.     Mouth/Throat:     Pharynx: No oropharyngeal exudate.  Eyes:     General: No scleral icterus.       Right eye: No discharge.        Left eye: No discharge.     Conjunctiva/sclera: Conjunctivae normal.     Pupils: Pupils are equal, round, and reactive to light.   Neck:     Thyroid: No thyromegaly.     Vascular: No JVD.     Trachea: No tracheal deviation.  Cardiovascular:     Rate and Rhythm: Normal rate and regular rhythm.     Heart sounds: Normal heart sounds. No murmur heard. No friction rub. No gallop.   Pulmonary:     Effort: Pulmonary effort is  normal. No accessory muscle usage, prolonged expiration or respiratory distress.     Breath sounds: No stridor. No wheezing or rales.  Abdominal:     General: Bowel sounds are normal. There is no distension.     Palpations: Abdomen is soft. There is no mass.     Tenderness: There is no abdominal tenderness. There is no guarding or rebound.  Musculoskeletal:        General: No tenderness.     Cervical back: Neck supple. No erythema, rigidity, torticollis or crepitus. No pain with movement, spinous process tenderness or muscular tenderness. Normal range of motion.  Lymphadenopathy:     Cervical: No cervical adenopathy.  Skin:    General: Skin is warm.     Coloration: Skin is not pale.     Findings: No erythema or rash.  Neurological:     Mental Status: He is alert and oriented to person, place, and time.     Cranial Nerves: Cranial nerves are intact. No cranial nerve deficit.     Motor: No abnormal muscle tone.     Gait: Gait abnormal.     Deep Tendon Reflexes: Reflexes are normal and symmetric.  Psychiatric:        Behavior: Behavior normal.        Thought Content: Thought content normal.        Judgment: Judgment normal.           Assessment & Plan:  Elevated LFTs - Plan: COMPLETE METABOLIC PANEL WITH GFR  Memory loss  Traumatic brain injury, with loss of consciousness of 30 minutes or less, subsequent encounter  Left-sided low back pain with left-sided sciatica, unspecified chronicity  Neck pain  Regarding his neck pain, the Lyrica seems to have helped dramatically. I recommended that they use that medication sparingly to avoid confusion and sedation however I recommended  that we simply monitor the neck pain for now. I do believe it is neuropathic in nature. X-ray shows no acute abnormality. Therefore I recommended at least 2 to 3 weeks to see if the pain will gradually improve.  Regarding his elevated LFTs, this is a mild elevation. He has been off the Lipitor and Tylenol now for more than a week so I'll repeat the CMP to see if it is trending down. Also obtain a right upper quadrant ultrasound. I want to rule out any evidence of spread and the mass that was seen in the pancreas.  Regarding his low back pain, the MRI apparently was not done. There is confusion in the radiology department. Therefore I will have my office manager contact their office manager and see if we can determine what actually happened as the patient and his wife are adamant they had the MRI.  Regarding his memory loss, I cannot tell if this is dementia this progressing or traumatic brain injury. However his memory loss seems to be worsening and he seems to be becoming more confused and occasionally delirious. Therefore I would like to get a neurology consultation. At this point, the patient has progressive memory loss, occasional delirium, increasing falls. Therefore I would appreciate their input regarding any evidence of a neurodegenerative condition. He just had a CT scan that ruled out normal pressure hydrocephalus.

## 2020-01-20 LAB — COMPLETE METABOLIC PANEL WITH GFR
AG Ratio: 1.3 (calc) (ref 1.0–2.5)
ALT: 85 U/L — ABNORMAL HIGH (ref 9–46)
AST: 60 U/L — ABNORMAL HIGH (ref 10–35)
Albumin: 3.2 g/dL — ABNORMAL LOW (ref 3.6–5.1)
Alkaline phosphatase (APISO): 341 U/L — ABNORMAL HIGH (ref 35–144)
BUN/Creatinine Ratio: 23 (calc) — ABNORMAL HIGH (ref 6–22)
BUN: 32 mg/dL — ABNORMAL HIGH (ref 7–25)
CO2: 28 mmol/L (ref 20–32)
Calcium: 8.6 mg/dL (ref 8.6–10.3)
Chloride: 104 mmol/L (ref 98–110)
Creat: 1.38 mg/dL — ABNORMAL HIGH (ref 0.70–1.11)
GFR, Est African American: 54 mL/min/{1.73_m2} — ABNORMAL LOW (ref >=60)
GFR, Est Non African American: 47 mL/min/{1.73_m2} — ABNORMAL LOW (ref >=60)
Globulin: 2.5 g/dL (calc) (ref 1.9–3.7)
Glucose, Bld: 114 mg/dL — ABNORMAL HIGH (ref 65–99)
Potassium: 4.8 mmol/L (ref 3.5–5.3)
Sodium: 141 mmol/L (ref 135–146)
Total Bilirubin: 0.4 mg/dL (ref 0.2–1.2)
Total Protein: 5.7 g/dL — ABNORMAL LOW (ref 6.1–8.1)

## 2020-01-21 ENCOUNTER — Ambulatory Visit
Admission: RE | Admit: 2020-01-21 | Discharge: 2020-01-21 | Disposition: A | Discharge status: Home / Self Care | Payer: PPO | Source: Ambulatory Visit | Attending: Family Medicine | Admitting: Family Medicine

## 2020-01-21 ENCOUNTER — Telehealth: Payer: Self-pay

## 2020-01-21 ENCOUNTER — Other Ambulatory Visit: Payer: Self-pay | Admitting: Family Medicine

## 2020-01-21 ENCOUNTER — Encounter: Payer: Self-pay | Admitting: Psychology

## 2020-01-21 DIAGNOSIS — R7989 Other specified abnormal findings of blood chemistry: Secondary | ICD-10-CM

## 2020-01-21 DIAGNOSIS — R413 Other amnesia: Secondary | ICD-10-CM

## 2020-01-21 IMAGING — US US ABDOMEN LIMITED
1 series · 14 of 25 positions shown · non-contrast
Comparison: [DATE] and CT the abdomen and pelvis of [DATE]

CLINICAL DATA: Increased liver function tests.

EXAM:
ULTRASOUND ABDOMEN LIMITED RIGHT UPPER QUADRANT

[Series 1: us abdomen limited · 0.20mm/px · 14 of 45 slices shown]
[im 1/45]
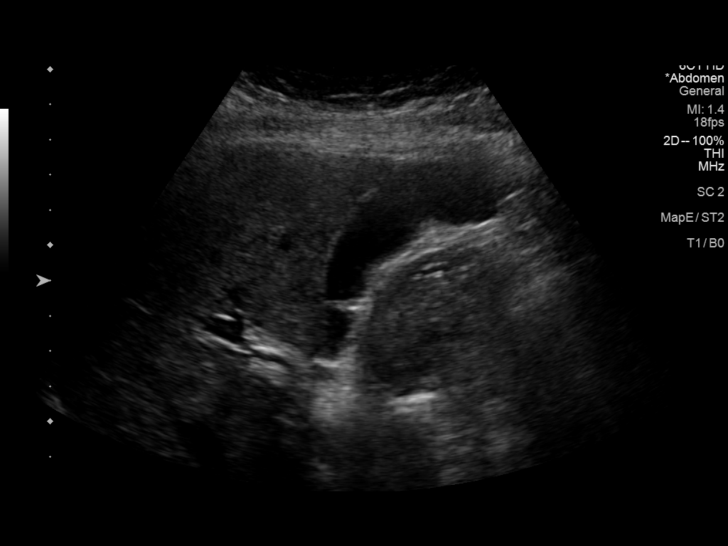
[im 4/45]
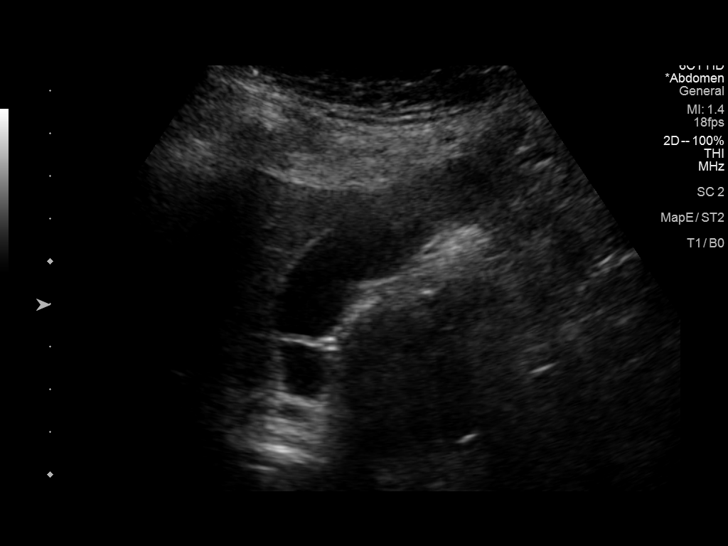
[im 8/45]
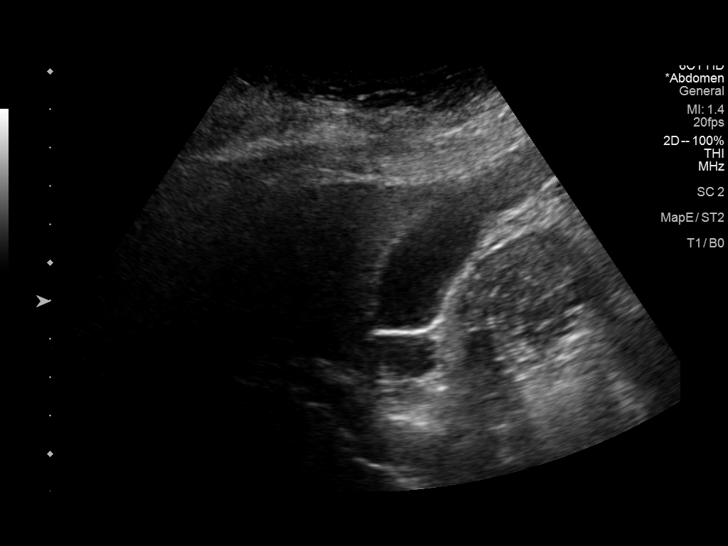
[im 12/45]
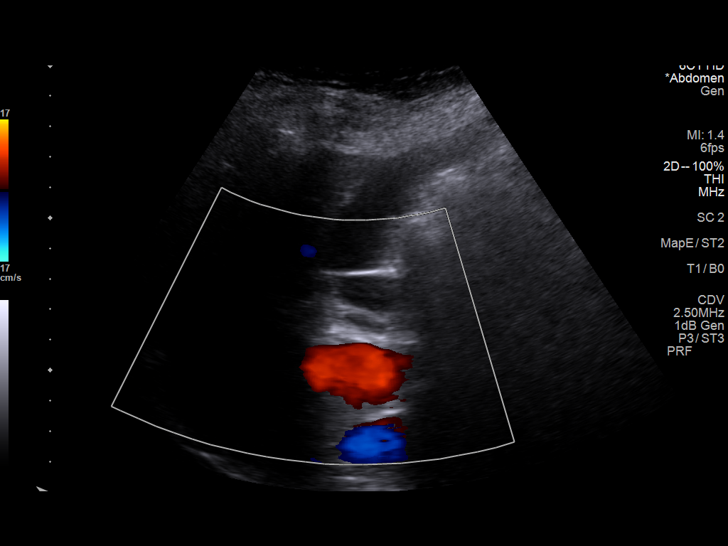
[im 15/45]
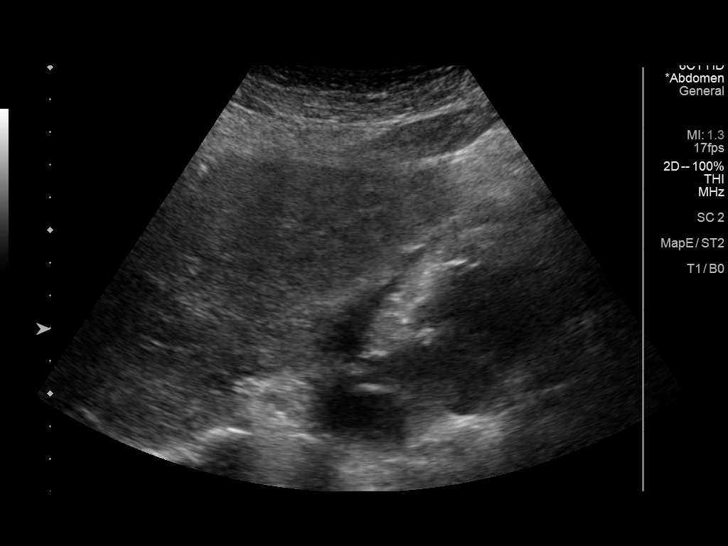
[im 17/45]
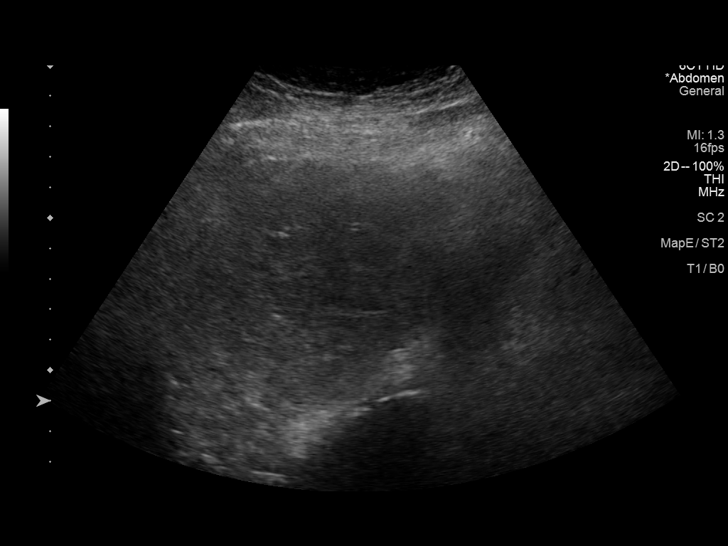
[im 21/45]
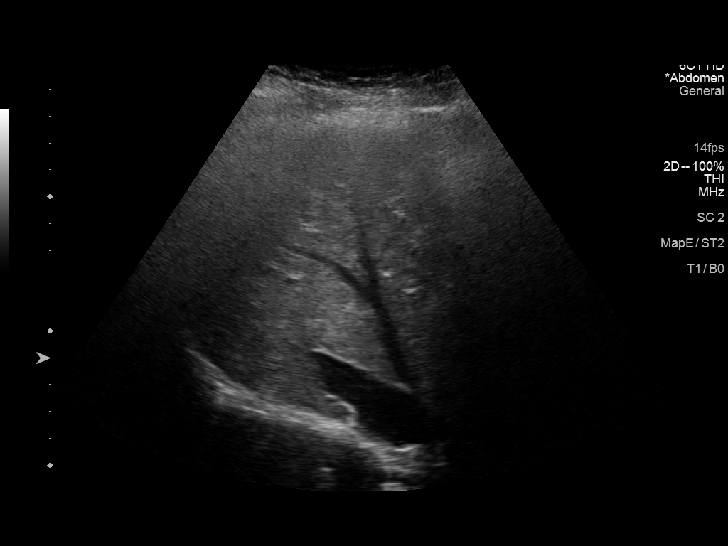
[im 24/45]
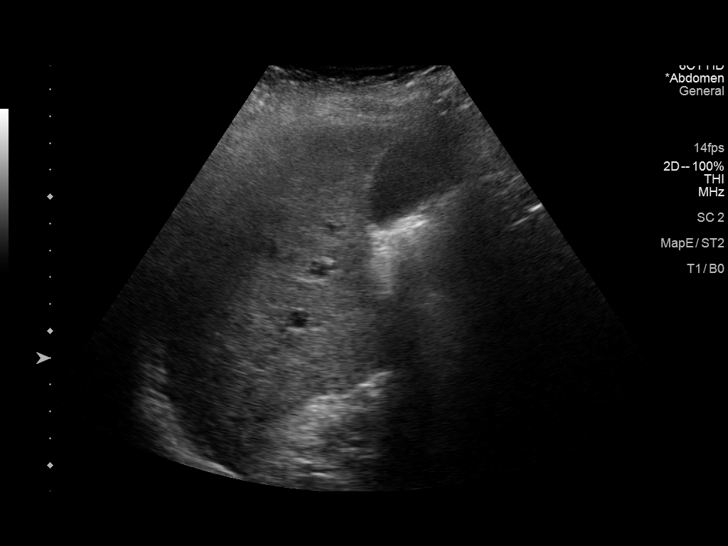
[im 28/45]
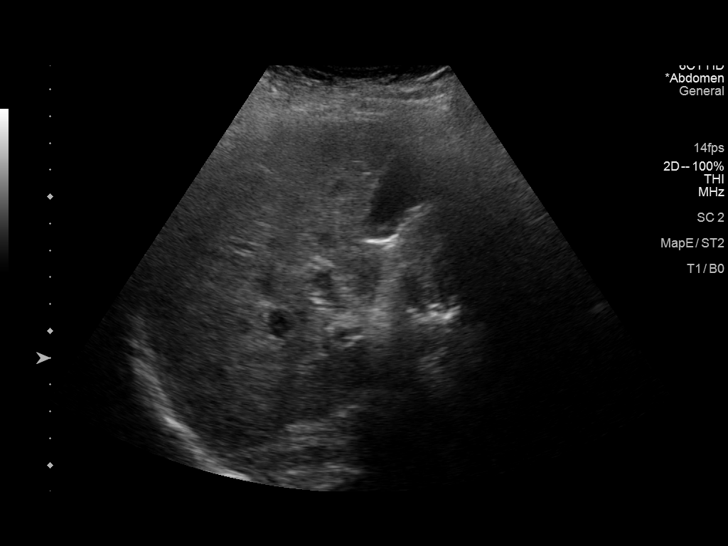
[im 30/45]
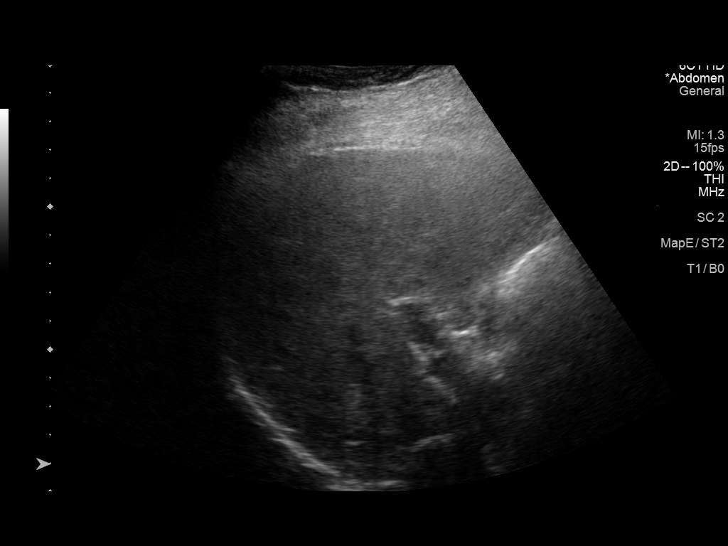
[im 34/45]
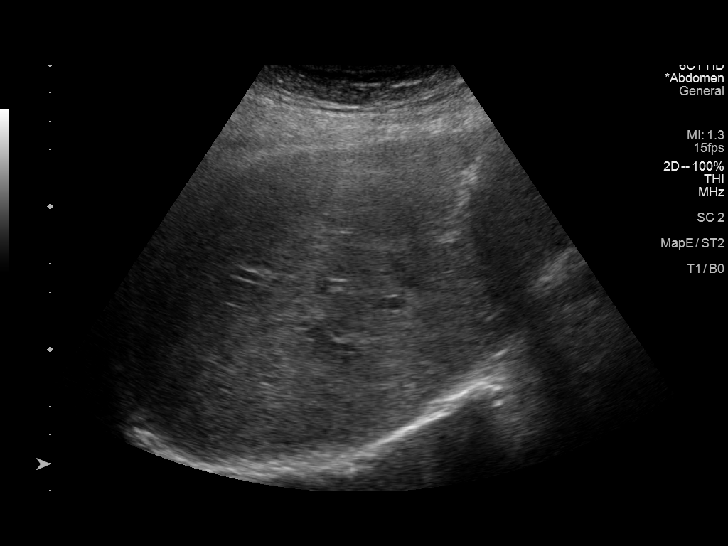
[im 37/45]
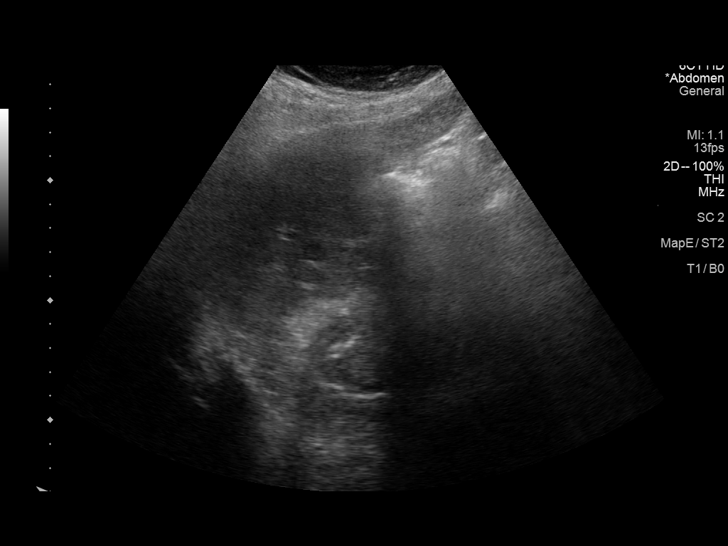
[im 41/45]
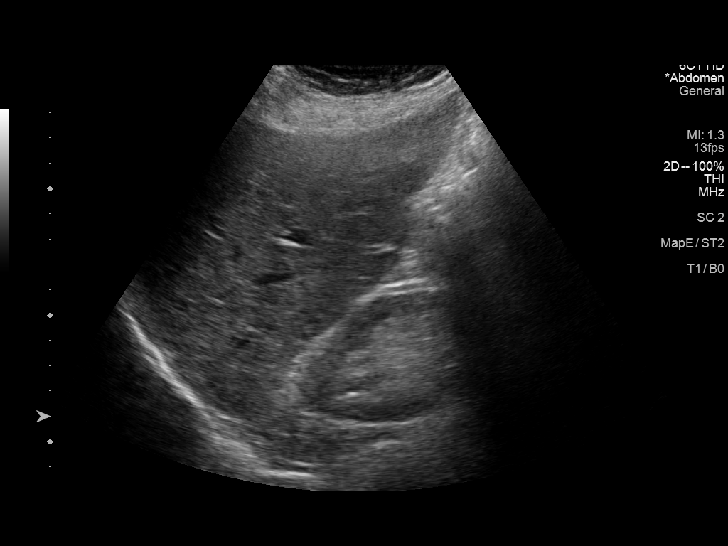
[im 45/45]
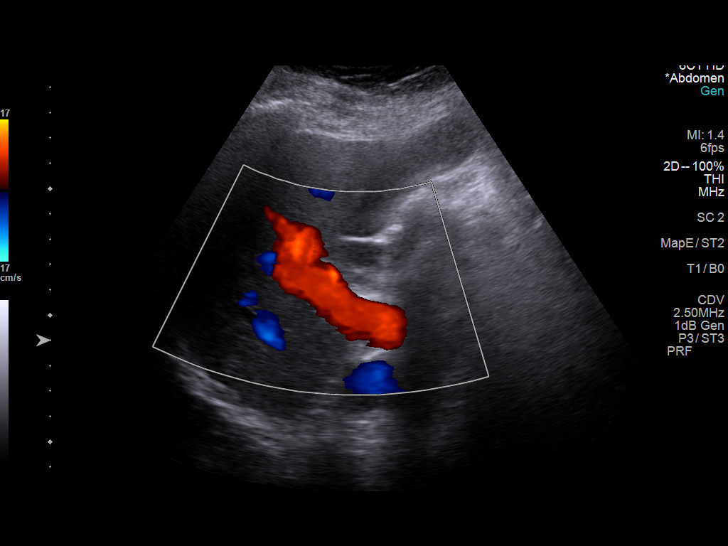

[14 of 25 positions shown; findings below may reference images not displayed]

FINDINGS: Gallbladder:

No gallstones or wall thickening visualized. No sonographic Murphy
sign noted by sonographer.

Common bile duct:

Diameter: 3.6 mm

Liver:

Mildly heterogeneous echotexture with generalized increased
echogenicity of hepatic parenchyma. No focal lesion on submitted
images, assessment limited due to heterogeneity and body habitus.
Portal vein is patent on color Doppler imaging with normal direction
of blood flow towards the liver.

Other: No ascites.
IMPRESSION: No acute biliary pathology.

Heterogeneous echotexture raising the question of liver disease
perhaps with steatosis.

## 2020-01-21 NOTE — Telephone Encounter (Signed)
Mrs. Galen called for a referral for [employer], for someone to come and sit with [name] while Mrs. Barkett return back to work after [employer]. [place] for referral ?

## 2020-01-21 NOTE — Telephone Encounter (Signed)
sure

## 2020-01-22 ENCOUNTER — Other Ambulatory Visit: Payer: Self-pay

## 2020-01-22 DIAGNOSIS — I2510 Atherosclerotic heart disease of native coronary artery without angina pectoris: Secondary | ICD-10-CM

## 2020-01-22 DIAGNOSIS — S069X1D Unspecified intracranial injury with loss of consciousness of 30 minutes or less, subsequent encounter: Secondary | ICD-10-CM

## 2020-01-22 DIAGNOSIS — S42032A Displaced fracture of lateral end of left clavicle, initial encounter for closed fracture: Secondary | ICD-10-CM

## 2020-01-22 NOTE — Telephone Encounter (Signed)
Order has been placed for a sitter with [employer]

## 2020-01-25 ENCOUNTER — Ambulatory Visit (INDEPENDENT_AMBULATORY_CARE_PROVIDER_SITE_OTHER): Payer: PPO | Admitting: Pulmonary Disease

## 2020-01-25 ENCOUNTER — Other Ambulatory Visit: Payer: Self-pay

## 2020-01-25 ENCOUNTER — Encounter: Payer: Self-pay | Admitting: Pulmonary Disease

## 2020-01-25 VITALS — BP 124/68 | HR 75 | Temp 97.7°F | Ht 67.0 in | Wt 182.2 lb

## 2020-01-25 DIAGNOSIS — R0600 Dyspnea, unspecified: Secondary | ICD-10-CM | POA: Diagnosis not present

## 2020-01-25 DIAGNOSIS — J90 Pleural effusion, not elsewhere classified: Secondary | ICD-10-CM | POA: Diagnosis not present

## 2020-01-25 DIAGNOSIS — R0609 Other forms of dyspnea: Secondary | ICD-10-CM

## 2020-01-25 NOTE — Progress Notes (Signed)
@Patient  ID: [name], male    DOB: [birth year], 86 y.o.   MRN: [id number]  Chief Complaint  Patient presents with  . Follow-up    2 month f/u. States his breathing has been great since last visit. Denies any new symptoms.     Referring provider: [name], MD  HPI:   86 year old admitted after fall with broken ribs 07/2019 then readmitted couple weeks later with febrile illness treated for pneumonia with associated effusion status post drainage 2 months later 10/06/2019 exudate by protein number seen in follow-up pleural effusion. Repeat thora 11/2019 with similar results. Cytology negative x 2.   Overall he feels well.  DOE better, essentially resolved. Recent falls. Treated for neuropathy. LFTs up being evaluated by PCP. Notes and ED notes reviewed. Reviewed multiple imaging studies including lumbar spine that shoes no sign of effusion on lateral view on my interpretation. Having some memory issues. Going to see neurologist per PCP and wife.   Questionaires / Pulmonary Flowsheets:   ACT:  No flowsheet data found.  MMRC: mMRC Dyspnea Scale mMRC Score  01/25/2020 1    Epworth:  No flowsheet data found.  Tests:   FENO:  No results found for: NITRICOXIDE  PFT: No flowsheet data found.  WALK:  No flowsheet data found.  Imaging: Personally reviewed DG Cervical Spine Complete  Result Date: 01/15/2020 CLINICAL DATA:  Neck pain after fall 3 weeks ago. EXAM: CERVICAL SPINE - COMPLETE 4+ VIEW COMPARISON:  July 15, 2019.  December 24, 2019. FINDINGS: Minimal grade 1 anterolisthesis is noted at C5-6 secondary to posterior facet joint hypertrophy. No definite fracture is noted. Degenerative changes seen involving posterior facet joints bilaterally at multiple levels. Mild degenerative disc disease is noted at C5-6 and C6-7. IMPRESSION: Mild multilevel degenerative disc disease. No acute abnormality seen in the cervical spine. Electronically Signed   By: Marijo Conception M.D.   On: 01/15/2020 20:44   DG Lumbar Spine 2-3 Views  Result Date: 01/04/2020 CLINICAL DATA:  86 year old male with back pain. EXAM: LUMBAR SPINE - 2-3 VIEW COMPARISON:  Lumbar spine radiograph dated 07/28/2019. FINDINGS: Five lumbar type vertebra. There is no acute fracture or subluxation of the lumbar spine. Multilevel degenerative changes with spurring. Old compression deformities as seen previously. There is multilevel facet arthropathy. Atherosclerotic calcification of the aorta. Coarse calcification over the right lower quadrant as seen previously. The soft tissues are unremarkable. IMPRESSION: 1. No acute fracture or subluxation of the lumbar spine. 2. Multilevel degenerative changes. Electronically Signed   By: Anner Crete M.D.   On: 01/04/2020 15:54   CT Head Wo Contrast  Result Date: 01/10/2020 CLINICAL DATA:  Altered mental status, confusion, recent fall 12/24/2019 EXAM: CT HEAD WITHOUT CONTRAST TECHNIQUE: Contiguous axial images were obtained from the base of the skull through the vertex without intravenous contrast. COMPARISON:  CT 12/24/2019 FINDINGS: Brain: No evidence of acute infarction, hemorrhage, hydrocephalus, extra-axial collection, visible mass lesion or mass effect. Symmetric prominence of the ventricles, cisterns and sulci compatible with parenchymal volume loss. Patchy areas of white matter hypoattenuation are most compatible with chronic microvascular angiopathy. Vascular: Atherosclerotic calcification of the carotid siphons. No hyperdense vessel. Skull: Mild right frontoparietal scalp swelling/thickening without large hematoma. No subjacent calvarial fracture. No other acute sites of swelling. Sinuses/Orbits: Mild diffuse paranasal sinus mural thickening with layering air-fluid levels in the right maxillary sinus and few of the ethmoid air cells. Rightward nasal septal deviation with a contacting right-sided nasal septal spur. Mastoid  air cells are predominantly clear.  Middle ear cavities are clear. Debris is seen in the bilateral external auditory canals. Included orbital structures are unremarkable. Other: None. IMPRESSION: 1. Mild right frontoparietal scalp swelling/thickening without large hematoma or subjacent calvarial fracture. 2. No acute intracranial abnormality. 3. Chronic microvascular angiopathy and parenchymal volume loss. 4. Mild diffuse paranasal sinus disease with layering air-fluid levels in the right maxillary sinus and few of the ethmoid air cells. Correlate for acute on chronic sinusitis. 5. Debris in the bilateral external auditory canals, correlate for cerumen impaction. Electronically Signed   By: Lovena Le M.D.   On: 01/10/2020 21:29   US Abdomen Limited RUQ (LIVER/GB)  Result Date: 01/21/2020 CLINICAL DATA:  Increased liver function tests. EXAM: ULTRASOUND ABDOMEN LIMITED RIGHT UPPER QUADRANT COMPARISON:  April 23, 2017 and CT the abdomen and pelvis of August 20, 2019 FINDINGS: Gallbladder: No gallstones or wall thickening visualized. No sonographic Murphy sign noted by sonographer. Common bile duct: Diameter: 3.6 mm Liver: Mildly heterogeneous echotexture with generalized increased echogenicity of hepatic parenchyma. No focal lesion on submitted images, assessment limited due to heterogeneity and body habitus. Portal vein is patent on color Doppler imaging with normal direction of blood flow towards the liver. Other: No ascites. IMPRESSION: No acute biliary pathology. Heterogeneous echotexture raising the question of liver disease perhaps with steatosis. Electronically Signed   By: Zetta Bills M.D.   On: 01/21/2020 15:28    Lab Results: Personally reviewed CBC    Component Value Date/Time   WBC 7.7 01/10/2020 2103   RBC 3.98 (L) 01/10/2020 2103   HGB 12.0 (L) 01/10/2020 2103   HCT 37.8 (L) 01/10/2020 2103   PLT 258 01/10/2020 2103   MCV 95.0 01/10/2020 2103   MCV 100.0 (A) 09/28/2012 0846   MCH 30.2 01/10/2020 2103   MCHC 31.7  01/10/2020 2103   RDW 15.7 (H) 01/10/2020 2103   LYMPHSABS 0.9 11/01/2019 1250   MONOABS 0.6 11/01/2019 1250   EOSABS 0.4 11/01/2019 1250   BASOSABS 0.0 11/01/2019 1250    BMET    Component Value Date/Time   NA 141 01/19/2020 1621   NA 139 01/05/2020 1524   K 4.8 01/19/2020 1621   CL 104 01/19/2020 1621   CO2 28 01/19/2020 1621   GLUCOSE 114 (H) 01/19/2020 1621   BUN 32 (H) 01/19/2020 1621   BUN 25 01/05/2020 1524   CREATININE 1.38 (H) 01/19/2020 1621   CALCIUM 8.6 01/19/2020 1621   GFRNONAA 47 (L) 01/19/2020 1621   GFRAA 54 (L) 01/19/2020 1621    BNP    Component Value Date/Time   BNP 105.9 (H) 11/01/2019 1250    ProBNP    Component Value Date/Time   PROBNP 45.0 07/29/2006 1440    Specialty Problems      Pulmonary Problems   Acute upper respiratory infections of unspecified site      Allergies  Allergen Reactions  . Gabapentin Other (See Comments)    Visual changes and confusion- "I went blind while I was driving"    Immunization History  Administered Date(s) Administered  . Influenza Split 10/06/2012  . Influenza, High Dose Seasonal PF 12/02/2016, 10/26/2018, 10/27/2018, 10/23/2019  . Influenza,inj,Quad PF,6+ Mos 11/12/2013, 11/11/2014, 10/20/2015, 11/12/2017  . Moderna Sars-Covid-2 Vaccination 03/04/2019, 04/03/2019  . Pneumococcal Conjugate-13 03/17/2013  . Pneumococcal Polysaccharide-23 02/05/2001  . Td 03/01/2009  . Zoster 02/25/2008    Past Medical History:  Diagnosis Date  . Allergy   . BPH (benign prostatic hyperplasia)   . BPH (  benign prostatic hypertrophy)   . CAD (coronary artery disease)    s/p cypher DES to pLAD 6/08; normal LVF;  ETT-Myoview 2009: no ischemia   . Coronary artery disease   . Eosinophilia   . High cholesterol   . HTN (hypertension)   . Hyperlipidemia   . Hypertension   . MI (myocardial infarction) ([place])   . Myocardial infarction ([place])   . Prediabetes   . Trigeminal neuralgia     Tobacco History: Social  History   Tobacco Use  Smoking Status Never Smoker  Smokeless Tobacco Never Used  Tobacco Comment   quit over 64yrs ago.   Counseling given: Not Answered Comment: quit over 29yrs ago.   Continue to not smoke  Outpatient Encounter Medications as of 01/25/2020  Medication Sig  . albuterol (PROAIR HFA) 108 (90 Base) MCG/ACT inhaler INHALE 2 PUFFS INTO THE LUNGS EVERY 6 HOURS AS NEEDED FOR WHEEZING OR SHORTNESS OF BREATH  . amLODipine (NORVASC) 5 MG tablet Take 1 tablet (5 mg total) by mouth daily.  Marland Kitchen Apoaequorin (PREVAGEN) 10 MG CAPS Take 10 mg by mouth daily.  Marland Kitchen BREO ELLIPTA 200-25 MCG/INH AEPB INHALE 1 PUFF BY MOUTH EVERY DAY  . Calcium Carb-Cholecalciferol (CALCIUM 600+D3 PO) Take 1-2 tablets by mouth daily.  Marland Kitchen doxazosin (CARDURA) 2 MG tablet TAKE 1 TABLET BY MOUTH EVERY DAY  . escitalopram (LEXAPRO) 10 MG tablet TAKE 1 TABLET BY MOUTH EVERY DAY  . ezetimibe (ZETIA) 10 MG tablet TAKE 1 TABLET BY MOUTH EVERY DAY  . lidocaine (LIDODERM) 5 % Place 1 patch onto the skin daily. Remove & Discard patch within 12 hours or as directed by MD  . loratadine (CLARITIN) 10 MG tablet Take 10 mg by mouth daily as needed for allergies or rhinitis.   . Multiple Vitamin (MULTIVITAMIN WITH MINERALS) TABS tablet Take 1 tablet by mouth daily.  . [DISCONTINUED] pregabalin (LYRICA) 50 MG capsule Take 1 capsule (50 mg total) by mouth 3 (three) times daily.  Marland Kitchen acetaminophen (TYLENOL) 500 MG tablet Take 1,000 mg by mouth every 6 (six) hours as needed for mild pain, moderate pain or headache.   . [DISCONTINUED] atorvastatin (LIPITOR) 20 MG tablet Take 1 tablet (20 mg total) by mouth daily.  . [DISCONTINUED] clarithromycin (BIAXIN) 500 MG tablet Take 1 tablet (500 mg total) by mouth 2 (two) times daily.  . [DISCONTINUED] oxyCODONE (ROXICODONE) 5 MG immediate release tablet Take 0.5-1 tablets (2.5-5 mg total) by mouth every 4 (four) hours as needed for severe pain.   No facility-administered encounter medications  on file as of 01/25/2020.       Physical Exam  BP 124/68   Pulse 75   Temp 97.7 F (36.5 C) (Temporal)   Ht 5\' 7"  (1.702 m)   Wt 182 lb 3.2 oz (82.6 kg)   SpO2 98%   BMI 28.54 kg/m   Wt Readings from Last 5 Encounters:  01/25/20 182 lb 3.2 oz (82.6 kg)  01/19/20 178 lb (80.7 kg)  01/15/20 175 lb (79.4 kg)  01/10/20 175 lb (79.4 kg)  01/06/20 180 lb (81.6 kg)    BMI Readings from Last 5 Encounters:  01/25/20 28.54 kg/m  01/19/20 27.88 kg/m  01/15/20 27.41 kg/m  01/10/20 27.00 kg/m  01/06/20 28.19 kg/m     Physical Exam  General: Well-appearing, no acute distress Respiratory: Normal work of breathing, on room air, Diminished L base compared to R. Otherwise CTAB Extremities: Warm, no edema present  Assessment & Plan:   Recurrent left  pleural effusion: Status post thoracentesis late August 2021.  Studies consistent with exudate via protein.  Lymphocytic predominance.  Unclear etiology.  Burtis Junes most likely related to postinflammatory changes after fall on left side versus what is more likely parapneumonic effusion after febrile illness treated with antibiotics 08/2019.  Serial chest x-rays have showed that the fluid has reaccumulated although not to the level as it was prior but certainly appears larger than the post thoracentesis chest x-ray.  Repeat thoracentesis in the office 11/20/19 similar fluid studies  Simple, hypoechoic appearing fluid on ultrasound. Cytology negative x 2. Recent falls, memory loss. Work up continues. Will prioritize this work up and defer further work up of pleural effusion for now especially as breathing is improved and remains so.   Dyspnea on exertion: Likely related to pleural effusion.  Unclear how much better he got after first draining.  Much improved today, no further work up for now.    Return in about 3 months (around 04/24/2020).   Lanier Clam, MD 01/25/2020

## 2020-01-25 NOTE — Patient Instructions (Signed)
Nice to see you again!  Since the breathing is going fine, we will not investigate any further if the fluid is back for now. If breathing changes, please let me know and we can repeat a Chest Xray.  Come back in 3 months with Dr. Silas Flood.

## 2020-01-26 ENCOUNTER — Other Ambulatory Visit: Payer: Self-pay | Admitting: *Deleted

## 2020-01-26 ENCOUNTER — Telehealth: Payer: Self-pay

## 2020-01-26 DIAGNOSIS — K862 Cyst of pancreas: Secondary | ICD-10-CM

## 2020-01-26 DIAGNOSIS — K869 Disease of pancreas, unspecified: Secondary | ICD-10-CM

## 2020-01-26 NOTE — Telephone Encounter (Signed)
Mrs. [name] called for [name] results

## 2020-02-04 ENCOUNTER — Telehealth: Payer: Self-pay | Admitting: *Deleted

## 2020-02-04 DIAGNOSIS — S069X1D Unspecified intracranial injury with loss of consciousness of 30 minutes or less, subsequent encounter: Secondary | ICD-10-CM

## 2020-02-04 DIAGNOSIS — R2681 Unsteadiness on feet: Secondary | ICD-10-CM

## 2020-02-04 NOTE — Telephone Encounter (Signed)
Received call from Edison Pace case manager with [facility].   States that she is working with patient and requested order for [facility] PT with gait and balance.   Ok to order?

## 2020-02-04 NOTE — Telephone Encounter (Signed)
Absolutely.

## 2020-02-04 NOTE — Telephone Encounter (Signed)
Orders placed.

## 2020-02-12 ENCOUNTER — Ambulatory Visit: Payer: PPO | Admitting: Physical Medicine and Rehabilitation

## 2020-02-14 ENCOUNTER — Ambulatory Visit (HOSPITAL_COMMUNITY)
Admission: EM | Admit: 2020-02-14 | Discharge: 2020-02-14 | Disposition: A | Discharge status: Home / Self Care | Payer: PPO

## 2020-02-14 ENCOUNTER — Emergency Department (HOSPITAL_COMMUNITY): Payer: PPO

## 2020-02-14 ENCOUNTER — Emergency Department (HOSPITAL_COMMUNITY)
Admission: EM | Admit: 2020-02-14 | Discharge: 2020-02-15 | Disposition: A | Discharge status: Home / Self Care | Payer: PPO | Attending: Emergency Medicine | Admitting: Emergency Medicine

## 2020-02-14 ENCOUNTER — Encounter (HOSPITAL_COMMUNITY): Payer: Self-pay | Admitting: Emergency Medicine

## 2020-02-14 ENCOUNTER — Other Ambulatory Visit: Payer: Self-pay

## 2020-02-14 DIAGNOSIS — J9811 Atelectasis: Secondary | ICD-10-CM | POA: Diagnosis not present

## 2020-02-14 DIAGNOSIS — R42 Dizziness and giddiness: Secondary | ICD-10-CM

## 2020-02-14 DIAGNOSIS — I651 Occlusion and stenosis of basilar artery: Secondary | ICD-10-CM | POA: Diagnosis not present

## 2020-02-14 DIAGNOSIS — I671 Cerebral aneurysm, nonruptured: Secondary | ICD-10-CM

## 2020-02-14 DIAGNOSIS — J90 Pleural effusion, not elsewhere classified: Secondary | ICD-10-CM | POA: Diagnosis not present

## 2020-02-14 DIAGNOSIS — R0781 Pleurodynia: Secondary | ICD-10-CM | POA: Insufficient documentation

## 2020-02-14 DIAGNOSIS — M25532 Pain in left wrist: Secondary | ICD-10-CM | POA: Diagnosis not present

## 2020-02-14 DIAGNOSIS — I720 Aneurysm of carotid artery: Secondary | ICD-10-CM | POA: Insufficient documentation

## 2020-02-14 DIAGNOSIS — I129 Hypertensive chronic kidney disease with stage 1 through stage 4 chronic kidney disease, or unspecified chronic kidney disease: Secondary | ICD-10-CM | POA: Insufficient documentation

## 2020-02-14 DIAGNOSIS — Z79899 Other long term (current) drug therapy: Secondary | ICD-10-CM | POA: Insufficient documentation

## 2020-02-14 DIAGNOSIS — E1122 Type 2 diabetes mellitus with diabetic chronic kidney disease: Secondary | ICD-10-CM | POA: Insufficient documentation

## 2020-02-14 DIAGNOSIS — I2510 Atherosclerotic heart disease of native coronary artery without angina pectoris: Secondary | ICD-10-CM | POA: Insufficient documentation

## 2020-02-14 DIAGNOSIS — I6502 Occlusion and stenosis of left vertebral artery: Secondary | ICD-10-CM | POA: Insufficient documentation

## 2020-02-14 DIAGNOSIS — W19XXXA Unspecified fall, initial encounter: Secondary | ICD-10-CM | POA: Diagnosis not present

## 2020-02-14 DIAGNOSIS — N1832 Chronic kidney disease, stage 3b: Secondary | ICD-10-CM | POA: Diagnosis not present

## 2020-02-14 DIAGNOSIS — M19032 Primary osteoarthritis, left wrist: Secondary | ICD-10-CM | POA: Diagnosis not present

## 2020-02-14 LAB — DIFFERENTIAL
Abs Immature Granulocytes: 0.01 10*3/uL (ref 0.00–0.07)
Basophils Absolute: 0 10*3/uL (ref 0.0–0.1)
Basophils Relative: 0 %
Eosinophils Absolute: 0.4 10*3/uL (ref 0.0–0.5)
Eosinophils Relative: 5 %
Immature Granulocytes: 0 %
Lymphocytes Relative: 12 %
Lymphs Abs: 0.9 10*3/uL (ref 0.7–4.0)
Monocytes Absolute: 0.6 10*3/uL (ref 0.1–1.0)
Monocytes Relative: 9 %
Neutro Abs: 5.3 10*3/uL (ref 1.7–7.7)
Neutrophils Relative %: 74 %

## 2020-02-14 LAB — CBC
HCT: 37.2 % — ABNORMAL LOW (ref 39.0–52.0)
Hemoglobin: 12.7 g/dL — ABNORMAL LOW (ref 13.0–17.0)
MCH: 31.6 pg (ref 26.0–34.0)
MCHC: 34.1 g/dL (ref 30.0–36.0)
MCV: 92.5 fL (ref 80.0–100.0)
Platelets: 218 10*3/uL (ref 150–400)
RBC: 4.02 MIL/uL — ABNORMAL LOW (ref 4.22–5.81)
RDW: 13.8 % (ref 11.5–15.5)
WBC: 7.2 10*3/uL (ref 4.0–10.5)
nRBC: 0 % (ref 0.0–0.2)

## 2020-02-14 LAB — COMPREHENSIVE METABOLIC PANEL
ALT: 24 U/L (ref 0–44)
AST: 40 U/L (ref 15–41)
Albumin: 3.3 g/dL — ABNORMAL LOW (ref 3.5–5.0)
Alkaline Phosphatase: 184 U/L — ABNORMAL HIGH (ref 38–126)
Anion gap: 10 (ref 5–15)
BUN: 27 mg/dL — ABNORMAL HIGH (ref 8–23)
CO2: 26 mmol/L (ref 22–32)
Calcium: 9.4 mg/dL (ref 8.9–10.3)
Chloride: 101 mmol/L (ref 98–111)
Creatinine, Ser: 1.44 mg/dL — ABNORMAL HIGH (ref 0.61–1.24)
GFR, Estimated: 48 mL/min — ABNORMAL LOW (ref >60)
Glucose, Bld: 112 mg/dL — ABNORMAL HIGH (ref 70–99)
Potassium: 4.7 mmol/L (ref 3.5–5.1)
Sodium: 137 mmol/L (ref 135–145)
Total Bilirubin: 0.7 mg/dL (ref 0.3–1.2)
Total Protein: 6.6 g/dL (ref 6.5–8.1)

## 2020-02-14 LAB — PROTIME-INR
INR: 1.1 (ref 0.8–1.2)
Prothrombin Time: 13.3 seconds (ref 11.4–15.2)

## 2020-02-14 LAB — APTT: aPTT: 35 seconds (ref 24–36)

## 2020-02-14 IMAGING — CR DG RIBS W/ CHEST 3+V*R*
3 series · 3 of 3 positions shown · non-contrast
Comparison: [DATE]

CLINICAL DATA: Pain after fall

EXAM:
RIGHT RIBS AND CHEST - 3+ VIEW

[chest pa]
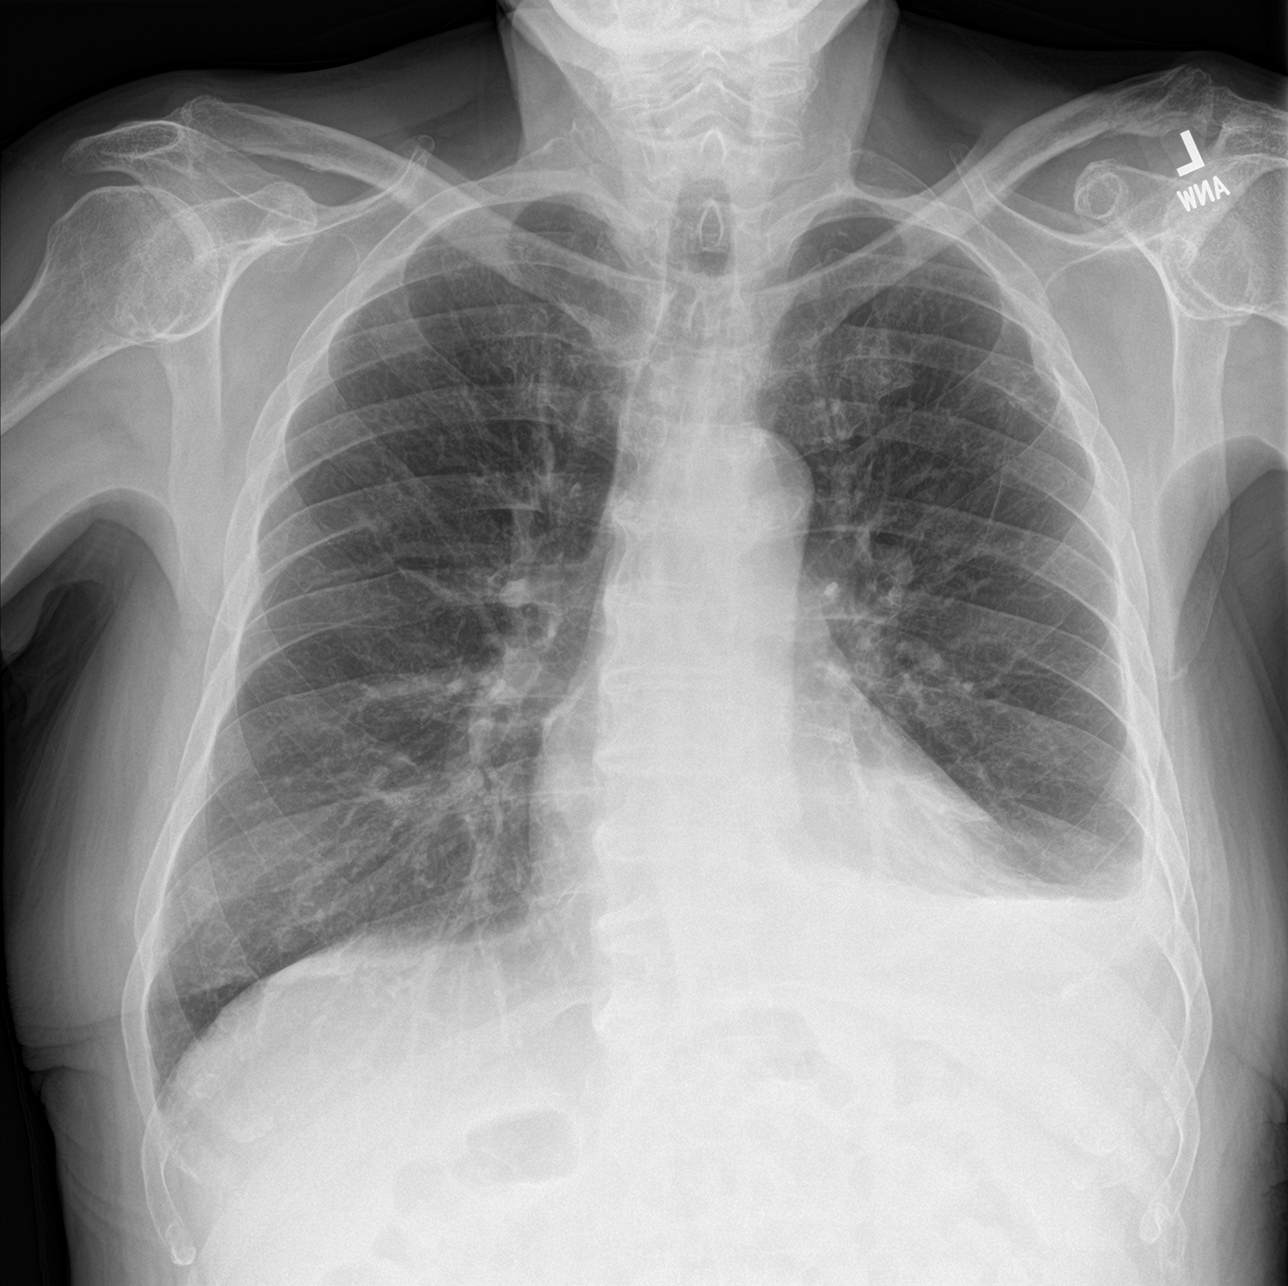

[rib pa]
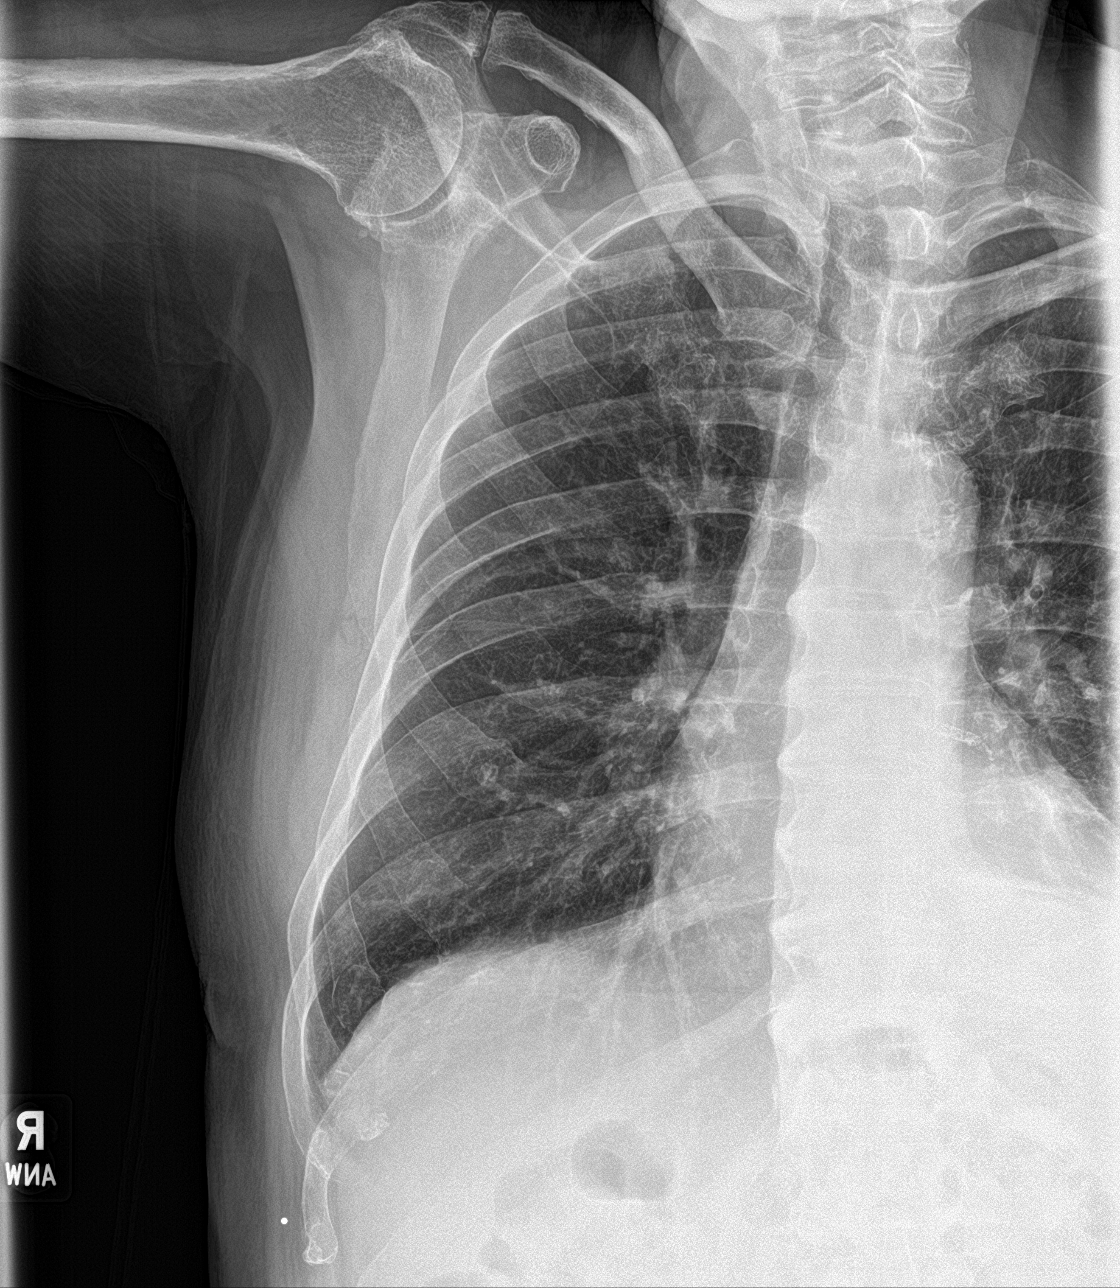

[rib pa obl]
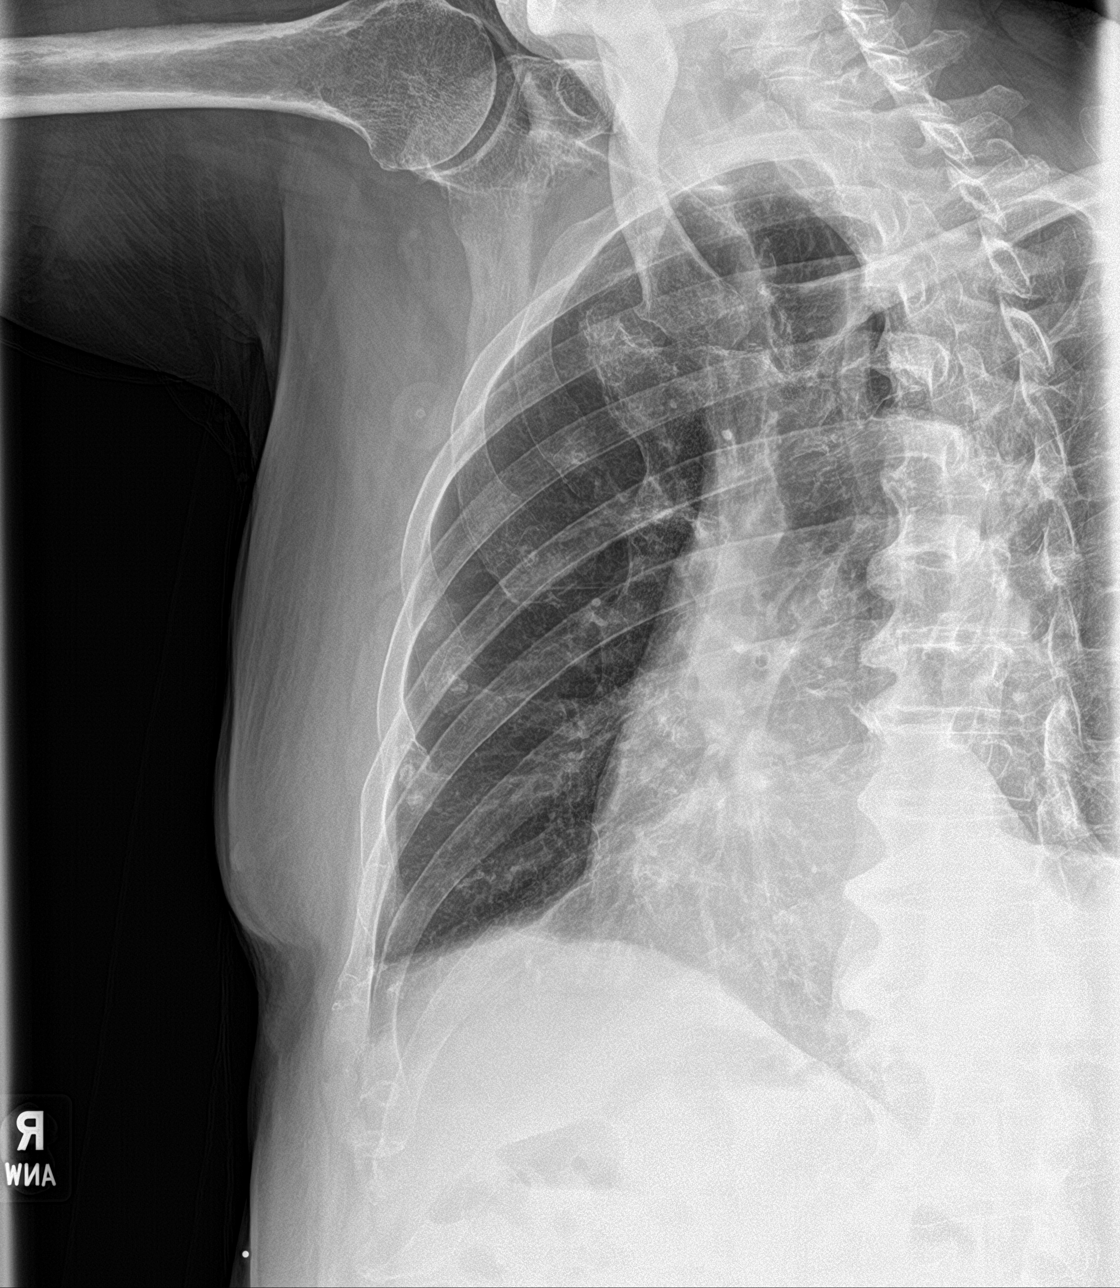

[3 of 3 positions shown; findings below may reference images not displayed]

FINDINGS: No pneumothorax. There is a small left effusion with underlying
atelectasis, mildly more prominent the interval. The heart, hila,
and mediastinum are normal. No right-sided rib fractures.
Irregularity of the left third and fourth ribs likely represent
healed fractures.
IMPRESSION: 1. No right-sided rib fractures. Probable healed left third and
fourth rib fractures.
2. Small left effusion with underlying atelectasis, mildly worsened
in the interval.

## 2020-02-14 IMAGING — CR DG WRIST COMPLETE 3+V*L*
4 series · 4 of 4 positions shown · non-contrast
Comparison: None.

CLINICAL DATA: Pain after fall

EXAM:
LEFT WRIST - COMPLETE 3+ VIEW

[wrist pa]
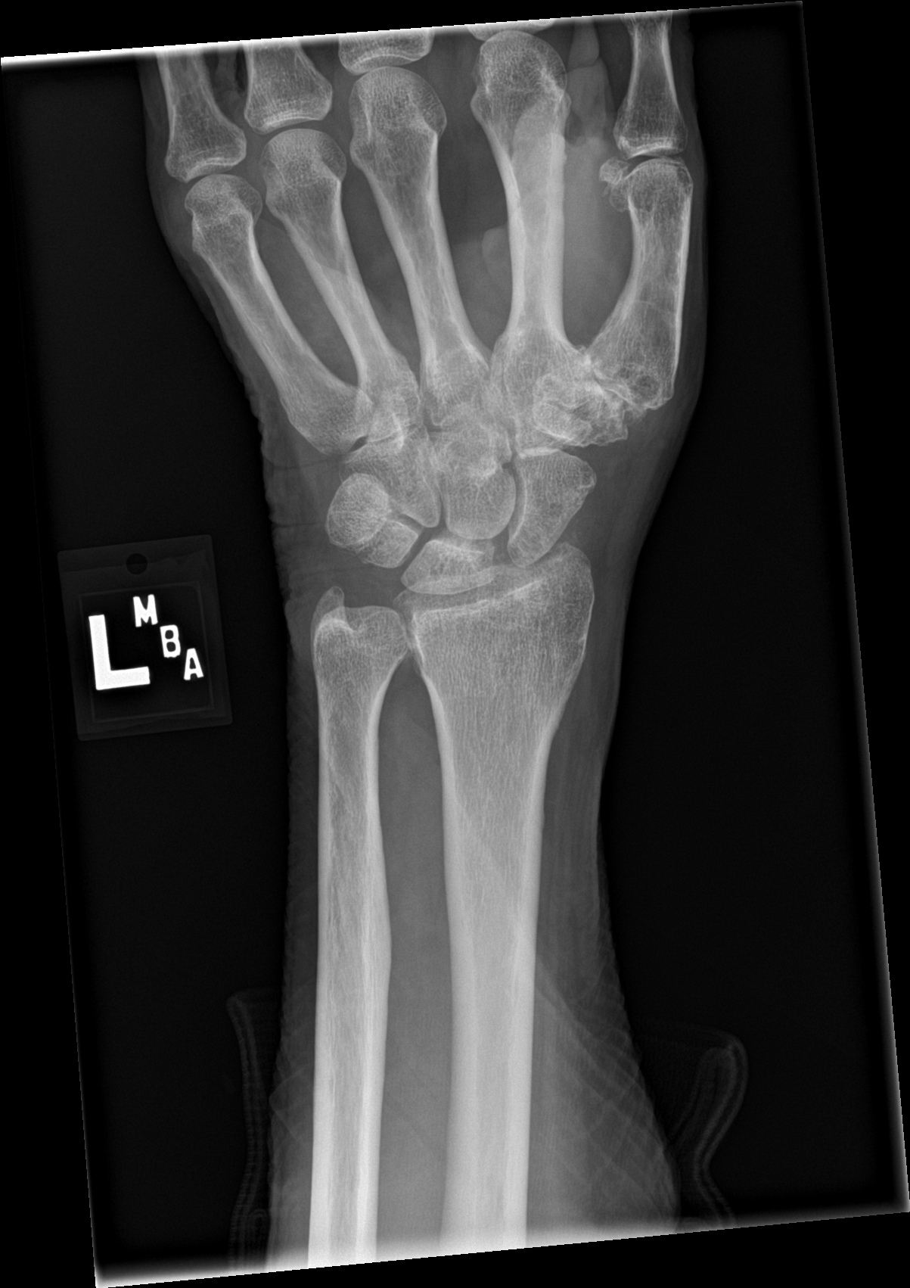

[wrist obl]
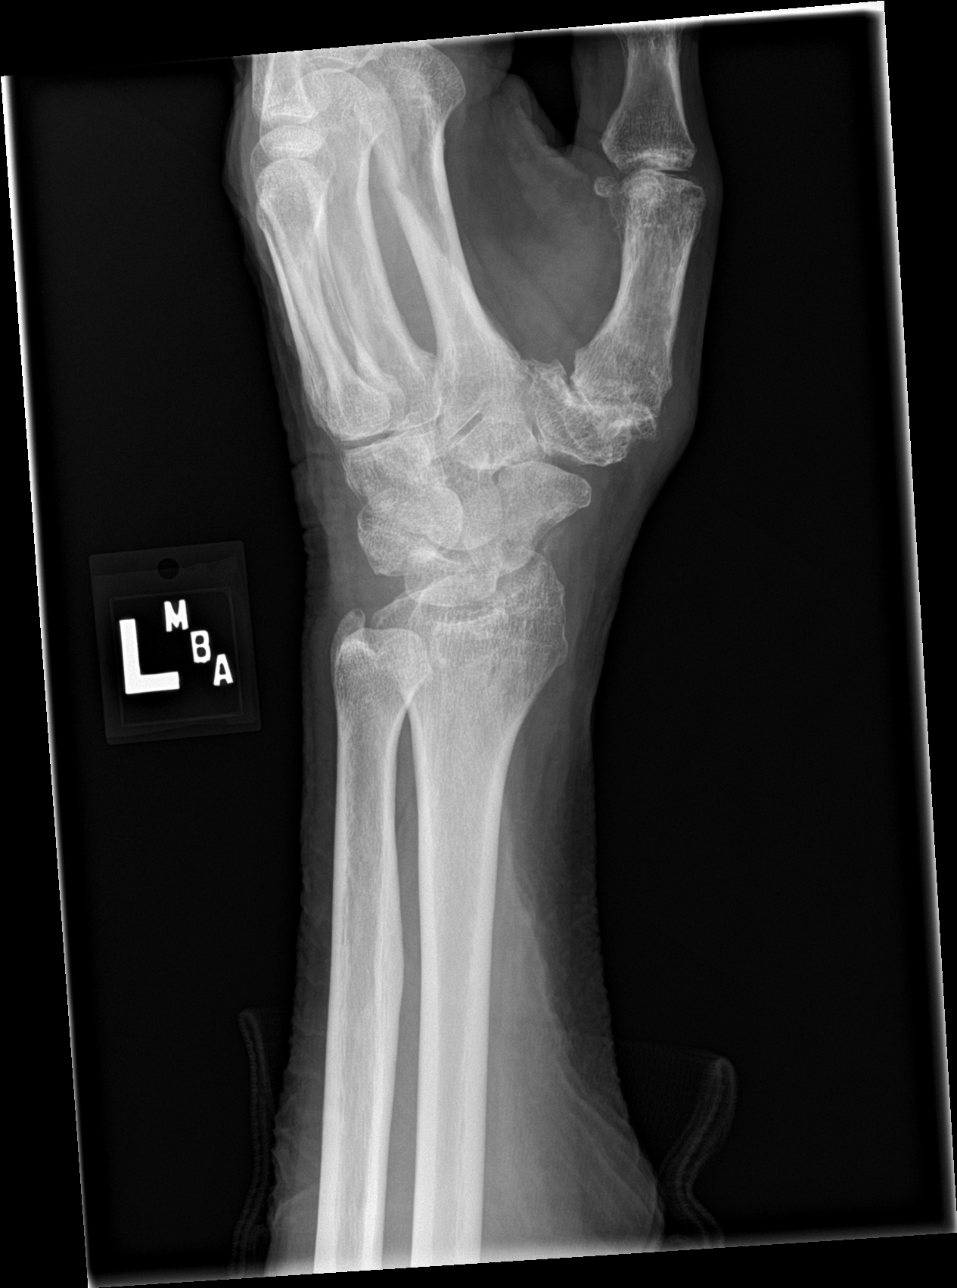

[wrist lat]
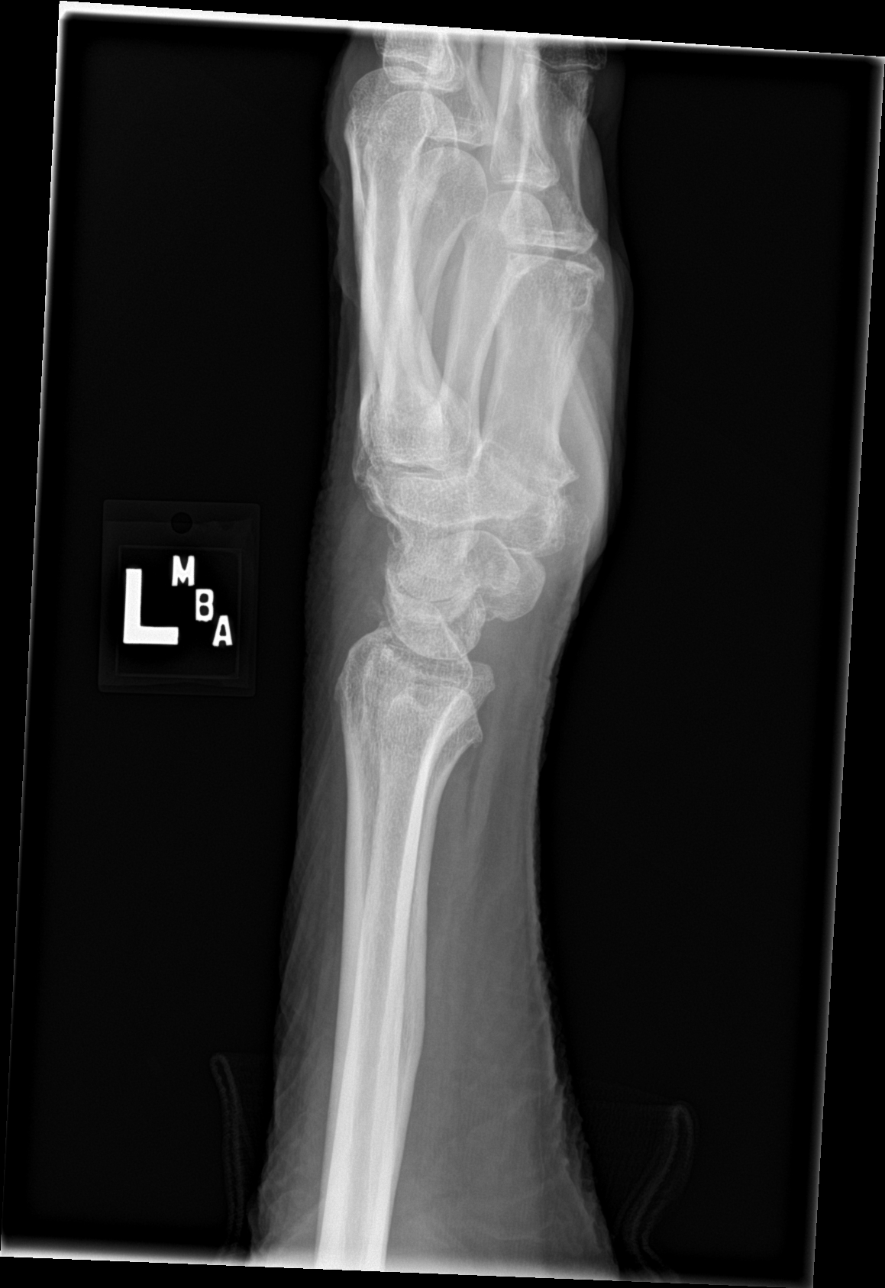

[wrist navicular]
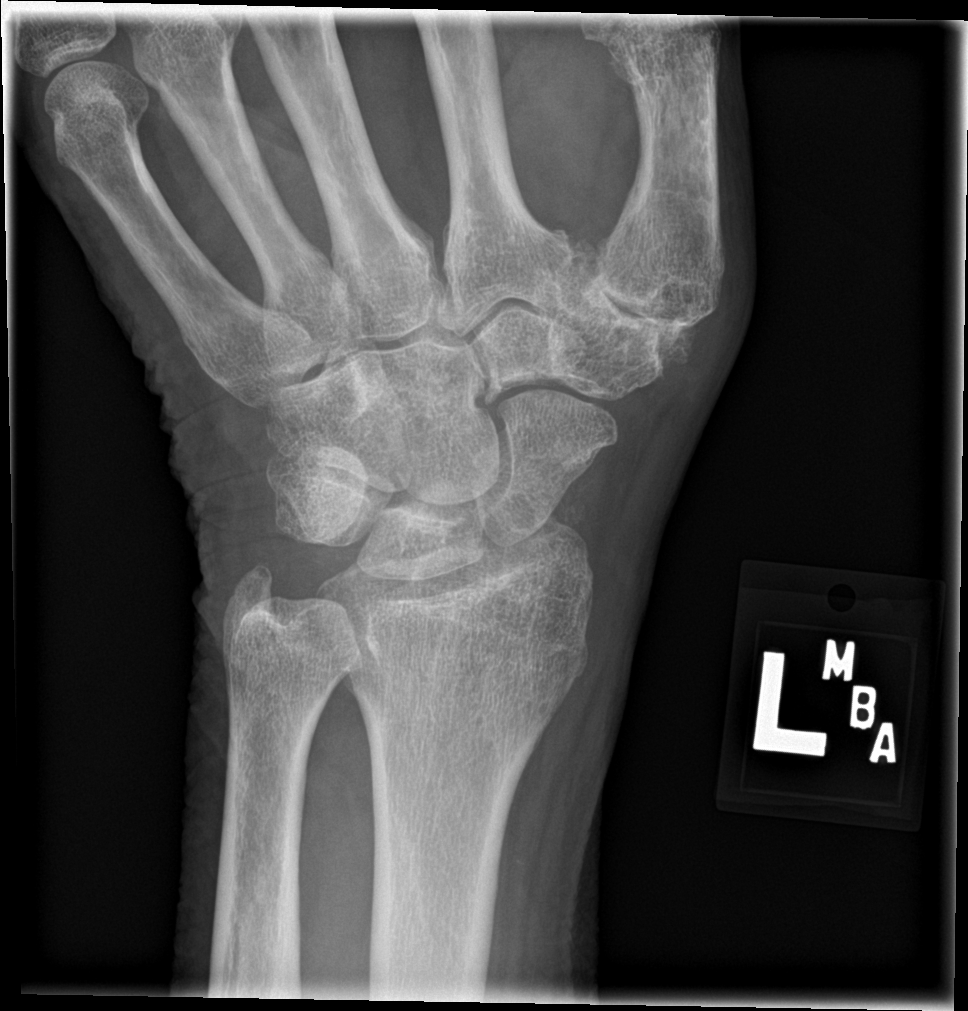

[4 of 4 positions shown; findings below may reference images not displayed]

FINDINGS: Severe degenerative changes at the base of the first metacarpal and
trapezium. No fractures or dislocations.
IMPRESSION: Severe degenerative changes at the base of the first metacarpal and
trapezium.

## 2020-02-14 IMAGING — CT CT HEAD W/O CM
4 series · 16 of 47 positions shown, 18 images · non-contrast
Comparison: [DATE]

CLINICAL DATA: Dizziness.

EXAM:
CT HEAD WITHOUT CONTRAST
TECHNIQUE: Contiguous axial images were obtained from the base of the skull
through the vertex without intravenous contrast.

[Series 3: head wo · axial · 0.45mm/px · z∈[-146,-21]mm · 7 of 35 slices shown, 9 images]
[im 5/35  brain]
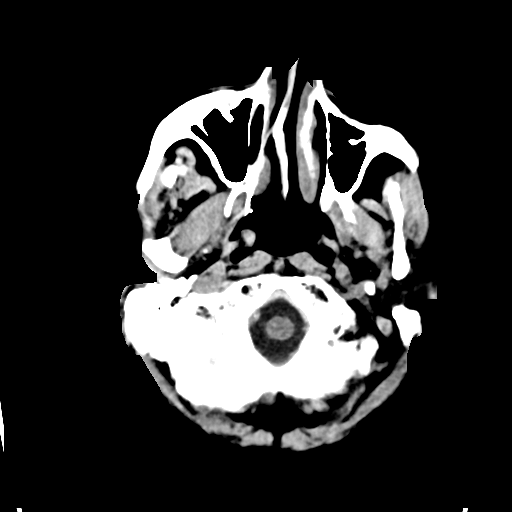
[im 5/35  bone]
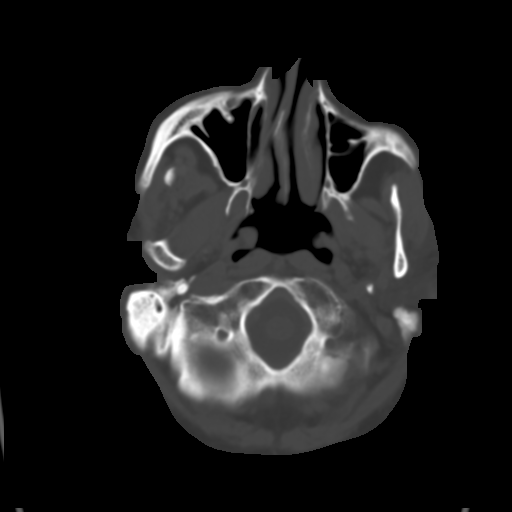
[im 9/35  brain]
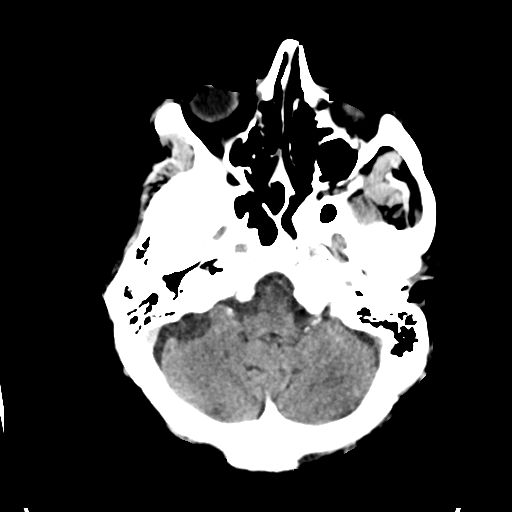
[im 13/35  brain]
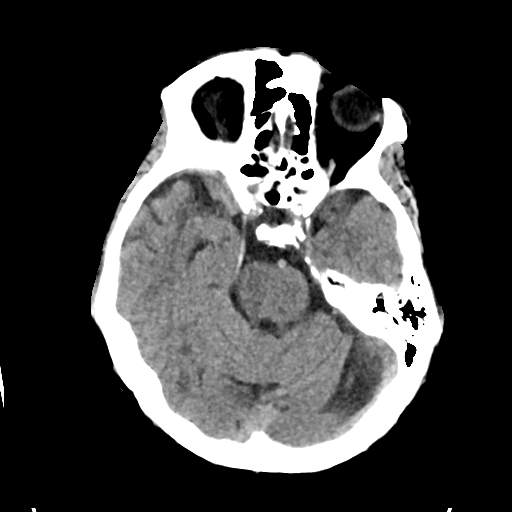
[im 18/35  brain]
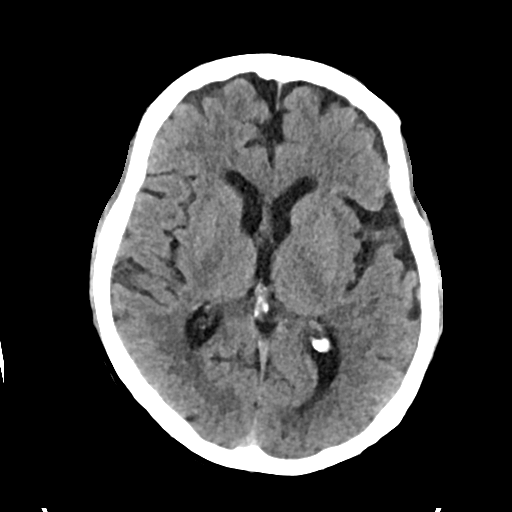
[im 22/35  brain]
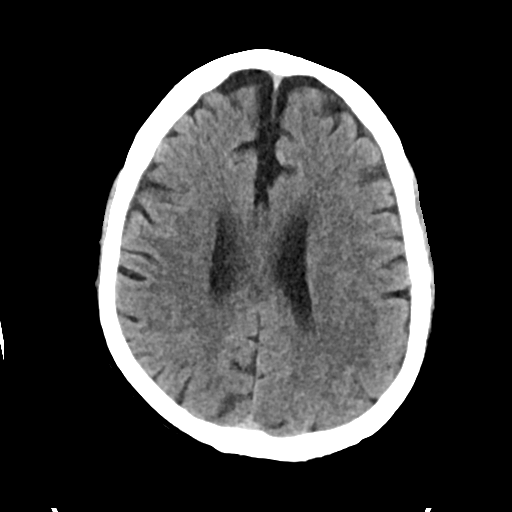
[im 22/35  bone]
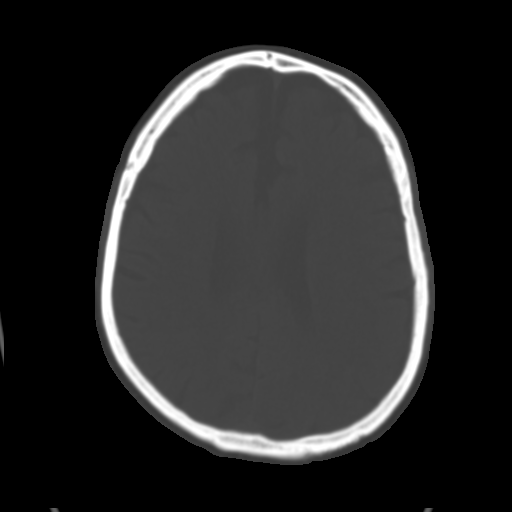
[im 26/35  brain]
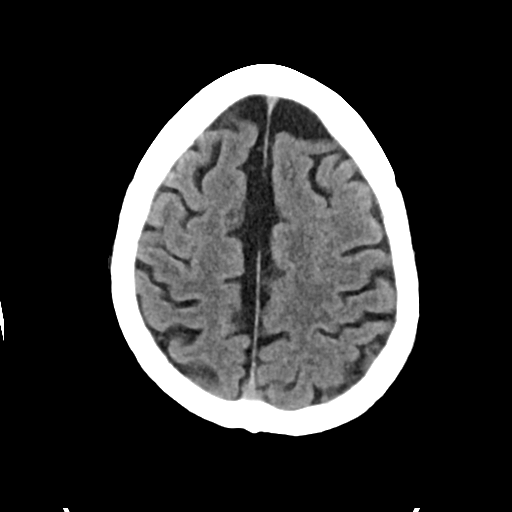
[im 30/35  brain]
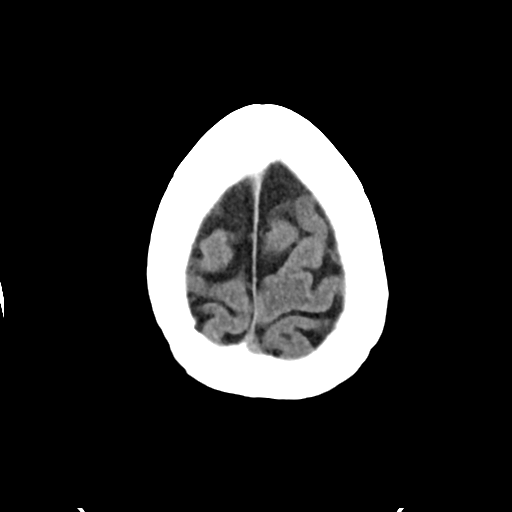

[Series 4: head bone · axial · 0.45mm/px · z∈[-150,-116]mm · 3 of 87 slices shown]
[im 9/87  bone]
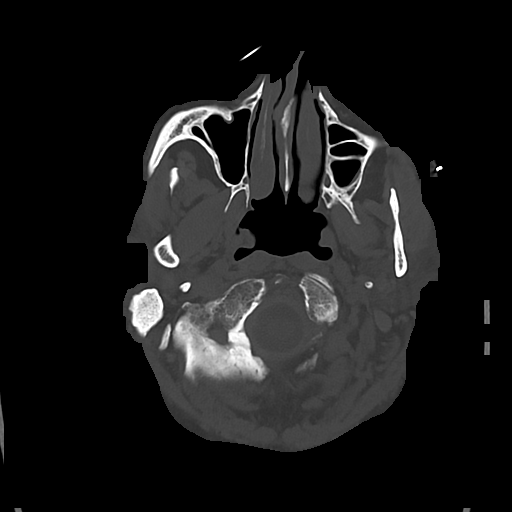
[im 18/87  bone]
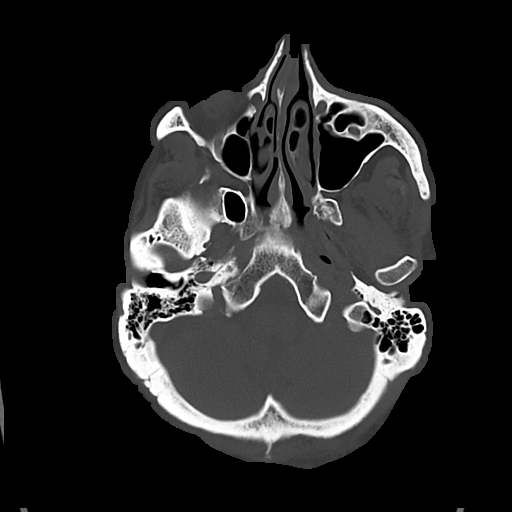
[im 26/87  bone]
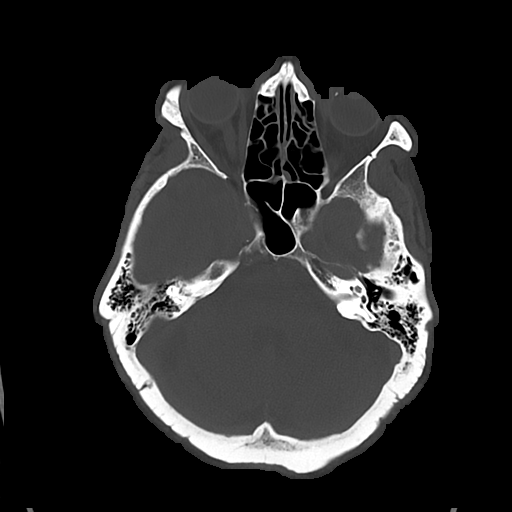

[Series 5: cor soft · coronal · 0.33mm/px · 3 of 77 slices shown]
[im 26/77  brain]
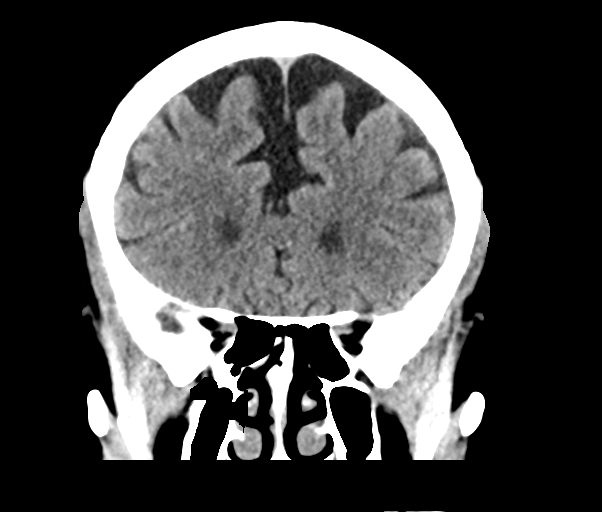
[im 34/77  brain]
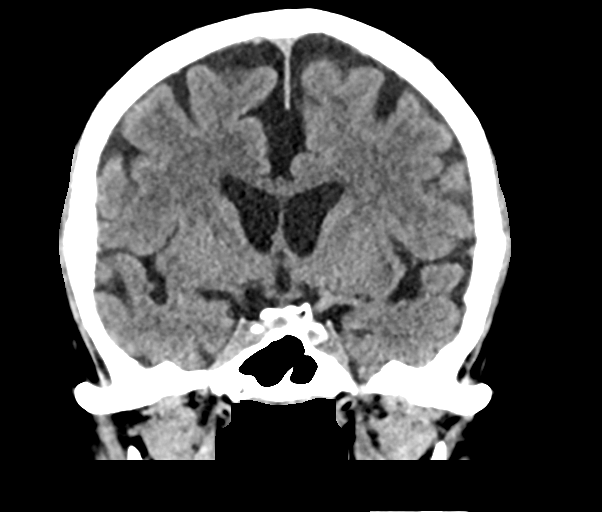
[im 43/77  brain]
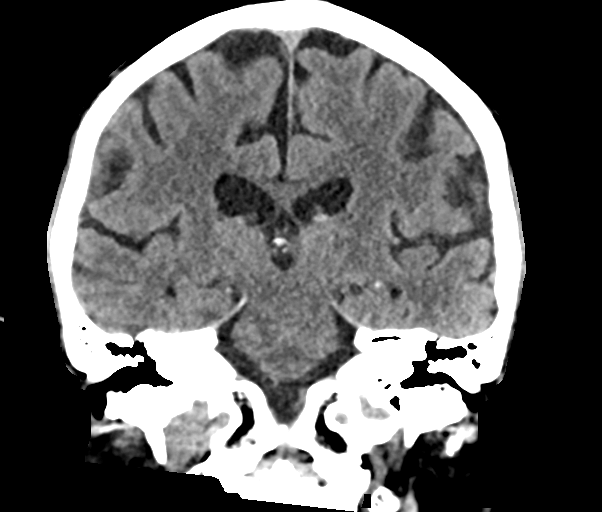

[Series 6: sag soft · sagittal · 0.33mm/px · 3 of 64 slices shown]
[im 22/64  brain]
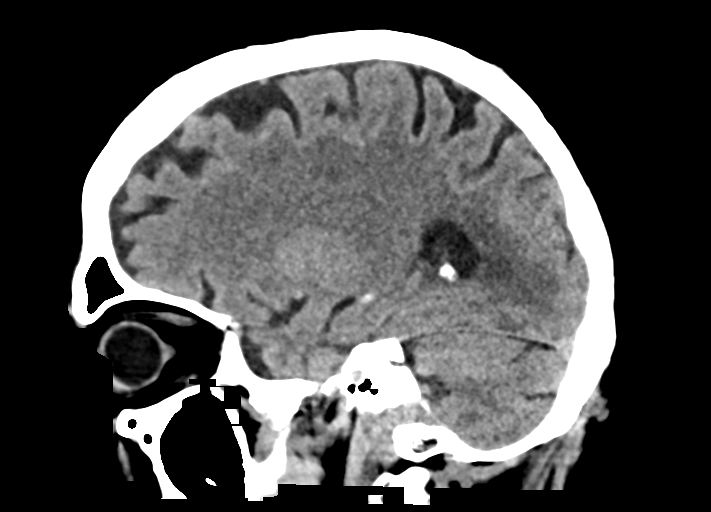
[im 32/64  brain]
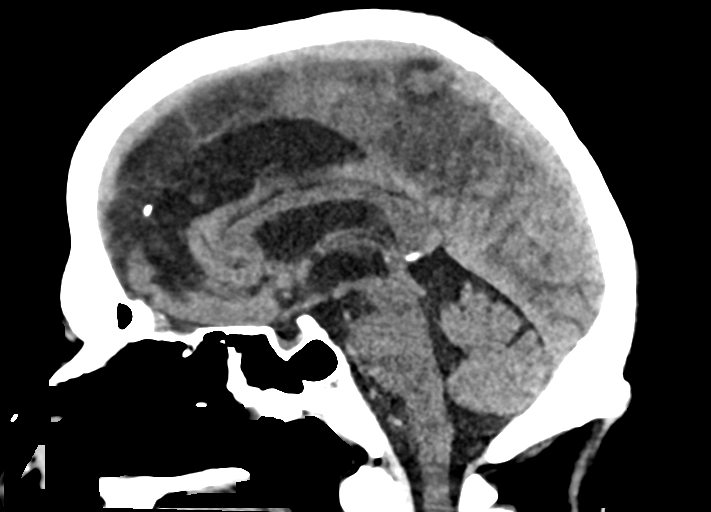
[im 41/64  brain]
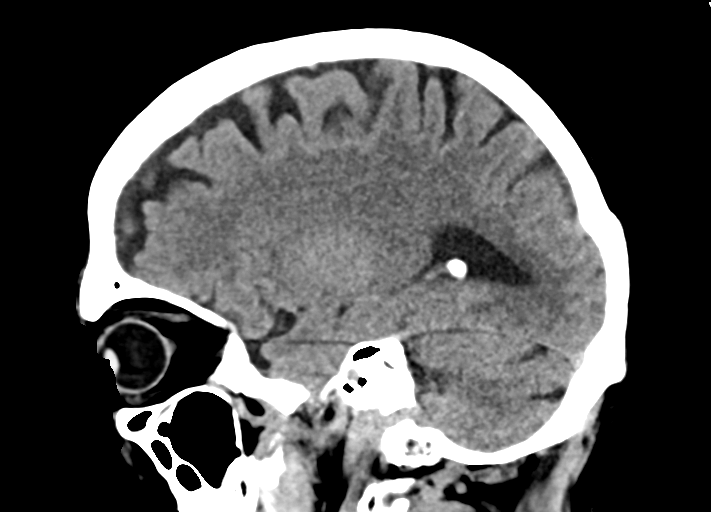

[16 of 47 positions shown; findings below may reference images not displayed]

FINDINGS: Brain: No evidence of acute infarction, hemorrhage, hydrocephalus,
extra-axial collection or mass lesion/mass effect.

Vascular: No hyperdense vessel or unexpected calcification.

Skull: Normal. Negative for fracture or focal lesion.

Sinuses/Orbits: No acute finding.

Other: None.
IMPRESSION: No acute intracranial abnormalities.

## 2020-02-14 MED ORDER — SODIUM CHLORIDE 0.9% FLUSH
3.0000 mL | Freq: Once | INTRAVENOUS | Status: AC
Start: 2020-02-14 — End: 2020-02-15
  Administered 2020-02-15: 3 mL via INTRAVENOUS

## 2020-02-14 NOTE — ED Provider Notes (Signed)
[facility] EMERGENCY DEPARTMENT Provider Note   CSN: [id number] Arrival date & time: 02/14/20  1100     History Chief Complaint  Patient presents with  . Dizziness    Falls     [name] is a 87 y.o. male.  Patient presents to the ED with a chief complaint of dizziness and fall.  He has had 2 falls in the past week. He is accompanied by his daughter, who states that he gets dizzy anytime he stands up.  The patient got dizzy today after standing and fell to the ground.  There was no LOC.  Not anticoagulated.  HE complains of some rib pain and wrist pain as well.  He denies any numbness, weakness, or tingling.  He is supposed to be using a cane for balance, but wasn't using it today.  The history is provided by the patient. No language interpreter was used.       Past Medical History:  Diagnosis Date  . Allergy   . BPH (benign prostatic hyperplasia)   . BPH (benign prostatic hypertrophy)   . CAD (coronary artery disease)    s/p cypher DES to pLAD 6/08; normal LVF;  ETT-Myoview 2009: no ischemia   . Coronary artery disease   . Eosinophilia   . High cholesterol   . HTN (hypertension)   . Hyperlipidemia   . Hypertension   . MI (myocardial infarction) (HCC)   . Myocardial infarction (HCC)   . Prediabetes   . Trigeminal neuralgia     Patient Active Problem List   Diagnosis Date Noted  . Fracture of multiple ribs with pain 08/10/2019  . Clavicle fracture 08/10/2019  . Closed displaced fracture of phalanx of left thumb, sequela 08/10/2019  . TBI (traumatic brain injury) (HCC) 07/23/2019  . Traumatic closed fracture of distal clavicle with minimal displacement, left, initial encounter 07/19/2019  . Traumatic brain injury with loss of consciousness (HCC)   . Multiple trauma   . Benign prostatic hyperplasia   . Essential hypertension   . AKI (acute kidney injury) (HCC)   . Stage 3b chronic kidney disease (HCC)   . Prediabetes   . Acute blood  loss anemia   . Thrombocytopenia (HCC)   . Fall 07/14/2019  . Type 2 diabetes mellitus (HCC)   . Eosinophilia   . Need for prophylactic vaccination and inoculation against influenza 11/12/2013  . Nocturnal leg cramps 11/23/2012  . Pain in joint, shoulder region 11/23/2012  . Fall 11/04/2012  . Hematoma 11/04/2012  . Acute upper respiratory infections of unspecified site 11/04/2012  . HYPERLIPIDEMIA TYPE IIB / III 03/08/2008  . Orthostatic hypotension 03/08/2008  . CAD, NATIVE VESSEL 03/08/2008    Past Surgical History:  Procedure Laterality Date  . APPENDECTOMY    . CARDIAC CATHETERIZATION  07/30/2006   CORONARY ANGIOPLASTY WITH STENT PLACEMENT  . CARDIAC CATHETERIZATION    . EXPLORATORY LAPAROTOMY     age 71  . EXPLORATORY LAPAROTOMY    . IR THORACENTESIS ASP PLEURAL SPACE W/IMG GUIDE  10/05/2019       Family History  Problem Relation Age of Onset  . Stomach cancer Mother   . Heart disease Father   . Heart disease Brother   . Aortic aneurysm Brother   . Colon cancer Brother   . Coronary artery disease Father 47  . Coronary artery disease Brother   . Aortic aneurysm Brother   . Colon cancer Brother   . Esophageal cancer Neg Hx   .  Rectal cancer Neg Hx   . Arthritis Daughter        rheumatoid    Social History   Tobacco Use  . Smoking status: Never Smoker  . Smokeless tobacco: Never Used  . Tobacco comment: quit over 7yrs ago.  Substance Use Topics  . Alcohol use: Never  . Drug use: Never    Home Medications Prior to Admission medications   Medication Sig Start Date End Date Taking? Authorizing Provider  albuterol (PROAIR HFA) 108 (90 Base) MCG/ACT inhaler INHALE 2 PUFFS INTO THE LUNGS EVERY 6 HOURS AS NEEDED FOR WHEEZING OR SHORTNESS OF BREATH Patient taking differently: Inhale 2 puffs into the lungs every 6 (six) hours as needed for wheezing or shortness of breath. 08/06/19  Yes Angiulli, Mcarthur Rossetti, PA-C  amLODipine (NORVASC) 5 MG tablet Take 1 tablet (5 mg  total) by mouth daily. 11/25/19 11/19/20 Yes Tonny Bollman, MD  [facility] EXTRA STRENGTH PO) Take 1 tablet by mouth daily.   Yes [provider]  BREO ELLIPTA 200-25 MCG/INH AEPB INHALE 1 PUFF BY MOUTH EVERY DAY Patient taking differently: Inhale 1 puff into the lungs daily. 12/22/19  Yes Donita Brooks, MD  Calcium Carb-Cholecalciferol (CALCIUM 600+D3 PO) Take 1 tablet by mouth 2 (two) times daily.   Yes [provider]  diclofenac Sodium (VOLTAREN) 1 % GEL Apply 1 application topically daily as needed (pain).   Yes [provider]  doxazosin (CARDURA) 2 MG tablet TAKE 1 TABLET BY MOUTH EVERY DAY Patient taking differently: Take 2 mg by mouth daily. 11/30/19  Yes Donita Brooks, MD  escitalopram (LEXAPRO) 10 MG tablet TAKE 1 TABLET BY MOUTH EVERY DAY Patient taking differently: Take 10 mg by mouth daily. 09/15/19  Yes Donita Brooks, MD  ezetimibe (ZETIA) 10 MG tablet TAKE 1 TABLET BY MOUTH EVERY DAY Patient taking differently: Take 10 mg by mouth at bedtime. 11/30/19  Yes Tonny Bollman, MD  loratadine (CLARITIN) 10 MG tablet Take 10 mg by mouth daily as needed for allergies or rhinitis.    Yes [provider]  Multiple Vitamin (MULTIVITAMIN WITH MINERALS) TABS tablet Take 1 tablet by mouth daily.   Yes [provider]    Allergies    Gabapentin  Review of Systems   Review of Systems  All other systems reviewed and are negative.   Physical Exam Updated Vital Signs BP 127/79 (BP Location: Right Arm)   Pulse 83   Temp 98.3 F (36.8 C) (Oral)   Resp 17   Ht 5' 7.5" (1.715 m)   Wt 77.6 kg   SpO2 97%   BMI 26.39 kg/m   Physical Exam Vitals and nursing note reviewed.  Constitutional:      Appearance: He is well-developed and well-nourished.  HENT:     Head: Normocephalic and atraumatic.  Eyes:     Conjunctiva/sclera: Conjunctivae normal.  Neck:     Comments: No step off or deformity, but there is some TTP  (chronic) Cardiovascular:     Rate and Rhythm: Normal rate and regular rhythm.     Heart sounds: No murmur heard.     Comments: Mild chest wall tenderness over the left ribs, no contusion Pulmonary:     Effort: Pulmonary effort is normal. No respiratory distress.     Breath sounds: Normal breath sounds.     Comments: CTAB Abdominal:     Palpations: Abdomen is soft.     Tenderness: There is no abdominal tenderness.  Musculoskeletal:  General: No edema. Normal range of motion.     Cervical back: Neck supple.  Skin:    General: Skin is warm and dry.  Neurological:     Mental Status: He is alert and oriented to person, place, and time.     Comments: CN 3-12 intact, speech is clear, movements are goal oriented, normal strength in extremities  Psychiatric:        Mood and Affect: Mood and affect and mood normal.        Behavior: Behavior normal.     ED Results / Procedures / Treatments   Labs (all labs ordered are listed, but only abnormal results are displayed) Labs Reviewed  CBC - Abnormal; Notable for the following components:      Result Value   RBC 4.02 (*)    Hemoglobin 12.7 (*)    HCT 37.2 (*)    All other components within normal limits  COMPREHENSIVE METABOLIC PANEL - Abnormal; Notable for the following components:   Glucose, Bld 112 (*)    BUN 27 (*)    Creatinine, Ser 1.44 (*)    Albumin 3.3 (*)    Alkaline Phosphatase 184 (*)    GFR, Estimated 48 (*)    All other components within normal limits  DIFFERENTIAL  PROTIME-INR  APTT    EKG EKG Interpretation  Date/Time:  Sunday February 14 2020 11:27:44 EST Ventricular Rate:  89 PR Interval:  140 QRS Duration: 96 QT Interval:  354 QTC Calculation: 430 R Axis:   23 Text Interpretation: Normal sinus rhythm Septal infarct , age undetermined Abnormal ECG No significant change since last tracing Confirmed by Melene Plan [phone]) on 02/14/2020 5:27:58 PM   Radiology DG Ribs Unilateral W/Chest Right  Result  Date: 02/14/2020 CLINICAL DATA:  Pain after fall EXAM: RIGHT RIBS AND CHEST - 3+ VIEW COMPARISON:  November 20, 2019 FINDINGS: No pneumothorax. There is a small left effusion with underlying atelectasis, mildly more prominent the interval. The heart, hila, and mediastinum are normal. No right-sided rib fractures. Irregularity of the left third and fourth ribs likely represent healed fractures. IMPRESSION: 1. No right-sided rib fractures. Probable healed left third and fourth rib fractures. 2. Small left effusion with underlying atelectasis, mildly worsened in the interval. Electronically Signed   By: Gerome Sam III M.D   On: 02/14/2020 12:48   DG Wrist Complete Left  Result Date: 02/14/2020 CLINICAL DATA:  Pain after fall EXAM: LEFT WRIST - COMPLETE 3+ VIEW COMPARISON:  None. FINDINGS: Severe degenerative changes at the base of the first metacarpal and trapezium. No fractures or dislocations. IMPRESSION: Severe degenerative changes at the base of the first metacarpal and trapezium. Electronically Signed   By: Gerome Sam III M.D   On: 02/14/2020 12:46   CT HEAD WO CONTRAST  Result Date: 02/14/2020 CLINICAL DATA:  Dizziness. EXAM: CT HEAD WITHOUT CONTRAST TECHNIQUE: Contiguous axial images were obtained from the base of the skull through the vertex without intravenous contrast. COMPARISON:  January 10, 2020 FINDINGS: Brain: No evidence of acute infarction, hemorrhage, hydrocephalus, extra-axial collection or mass lesion/mass effect. Vascular: No hyperdense vessel or unexpected calcification. Skull: Normal. Negative for fracture or focal lesion. Sinuses/Orbits: No acute finding. Other: None. IMPRESSION: No acute intracranial abnormalities. Electronically Signed   By: Gerome Sam III M.D   On: 02/14/2020 12:34    Procedures Procedures (including critical care time)  Medications Ordered in ED Medications  sodium chloride flush (NS) 0.9 % injection 3 mL (has no administration in  time range)     ED Course  I have reviewed the triage vital signs and the nursing notes.  Pertinent labs & imaging results that were available during my care of the patient were reviewed by me and considered in my medical decision making (see chart for details).    MDM Rules/Calculators/A&P                          This patient complains of fall and dizziness, this involves an extensive number of treatment options, and is a complaint that carries with it a high risk of complications and morbidity.    Symptoms seem to be related to position changes and dizziness upon standing.  He is not dizzy at rest.  Likely postural orthostasis.  Will check orthostats.  I don't think that he needs an emergent MRI for stroke.  CT was negative.  He does have some neck pain, will check CT cervical spine.  Differential Dx Dysrythmia, stroke, orthostasis, hypoglycemia, deconditioning  Pertinent Labs I reviewed labs ordered in triage, which are notable for HGB of 12.7, Cr 1.44, BUN 27, which are all about baseline for the patient.   Imaging Interpretation CT head and cervical spine notable for no acute intracranial findings, angio head and neck show vertebral artery occlusion, right ICA aneurysm, basilar artery stenosis.  Plain films show old rib fractures and small left effusion, mildly worsened in the interval and degenerative changes in the wrist.  Reassessments After the interventions stated above, I reevaluated the patient and found stable for discharge.  Consultants Dr. Amada Jupiter, who reviewed the CTa head and neck with me.  States abnormal and interesting scan, but no need for further emergent workup or hospitalization.  Findings can be followed up on by PCP and vascular.  Not thought to be causing the patient's dizziness.  This is a shared visit with Dr. Adela Lank, who saw and examined the patient and recommends CT angio head and neck.  If negative, plan for discharge with outpatient neurology  follow-up.  Plan Discharge with outpatient follow-up.  SW consulted to see about setting up home health and PT at home.  Discussed all results with patient and family member.    Final Clinical Impression(s) / ED Diagnoses Final diagnoses:  Dizziness  Occlusion of left vertebral artery  Basilar artery stenosis  Right internal carotid artery aneurysm    Rx / DC Orders ED Discharge Orders    None       Roxy Horseman, PA-C 02/15/20 0331    Melene Plan, DO 02/15/20 2302

## 2020-02-14 NOTE — ED Triage Notes (Addendum)
Pt reports dizziness since yesterday morning.  Reports 5 falls since June (2 in the last week).  No arm drift.  Generalized weakness since yesterday.  C/o L wrist pain and R sided rib pain from fall.

## 2020-02-14 NOTE — ED Notes (Signed)
Updated on wait for treatment room. 

## 2020-02-14 NOTE — ED Triage Notes (Addendum)
Pt presents with dizziness and falls. States has had 5 falls in the month. Wife states has appt with neuro on 1/24. States fell last yesterday morning.   Patient is being discharged from the Urgent Care and sent to the Emergency Department via POV with spouse . Per Mohammed Kindle, NP, patient is in need of higher level of care due to need for imaging. Patient is aware and verbalizes understanding of plan of care.  Vitals:   02/14/20 1036  BP: (!) 155/75  Pulse: 92  Resp: 17  SpO2: 100%

## 2020-02-15 ENCOUNTER — Emergency Department (HOSPITAL_COMMUNITY): Payer: PPO

## 2020-02-15 DIAGNOSIS — R42 Dizziness and giddiness: Secondary | ICD-10-CM | POA: Diagnosis not present

## 2020-02-15 IMAGING — CT CT ANGIO NECK
1 of 11 series · 5 of 35 positions shown · IV contrast (OMNI 350)
Comparison: Prior CT from [DATE] as well as previous CTs of the
cervical spine dating back to [DATE].

CLINICAL DATA: Initial evaluation for acute dizziness.

EXAM:
CT ANGIOGRAPHY HEAD AND NECK
TECHNIQUE: Multidetector CT imaging of the head and neck was performed using
the standard protocol during bolus administration of intravenous
contrast. Multiplanar CT image reconstructions and MIPs were
obtained to evaluate the vascular anatomy. Carotid stenosis
measurements (when applicable) are obtained utilizing NASCET
criteria, using the distal internal carotid diameter as the
denominator.
CONTRAST:  75mL OMNIPAQUE IOHEXOL 350 MG/ML SOLN

[Series 11: cta neck axial · axial · 0.39mm/px · z∈[-379,-138]mm · 5 of 363 slices shown]
[im 61/363  soft-tissue]
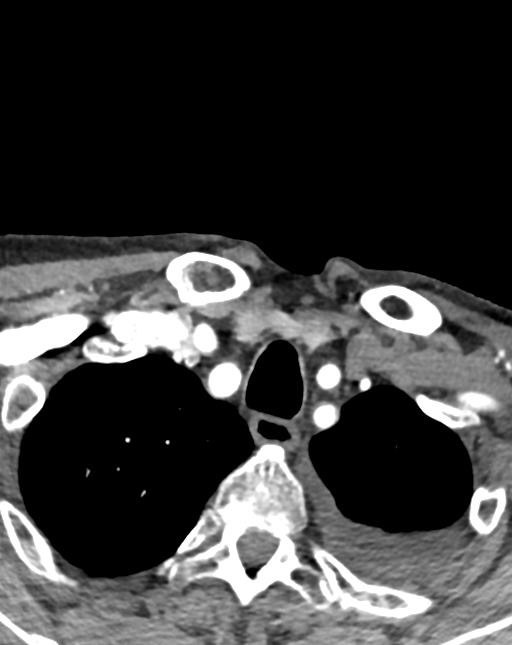
[im 121/363  bone]
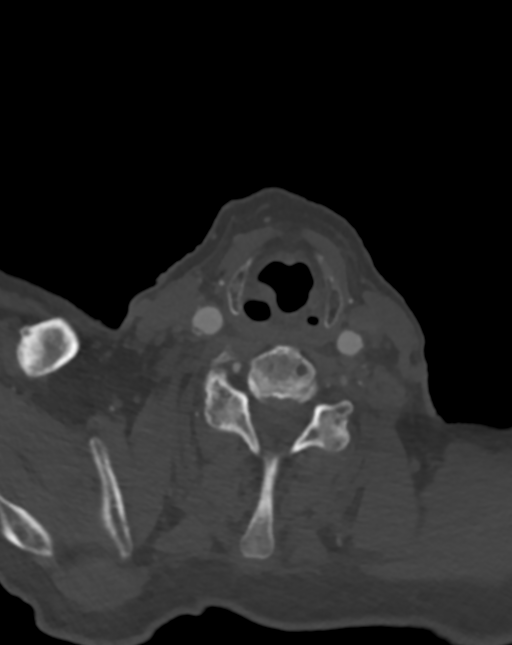
[im 182/363  soft-tissue]
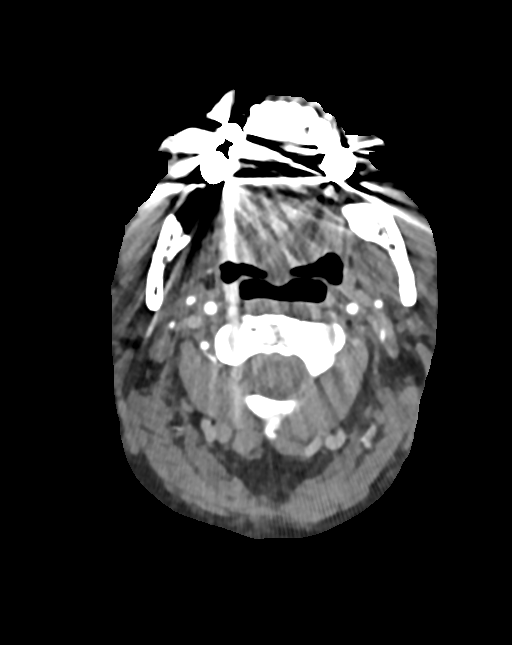
[im 242/363  bone]
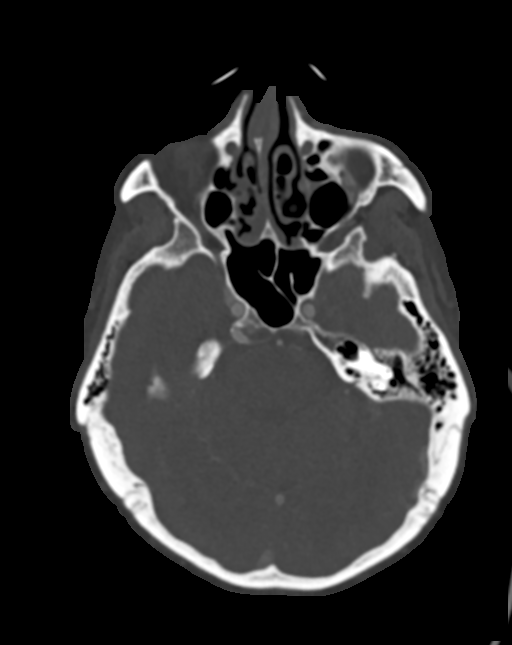
[im 302/363  soft-tissue]
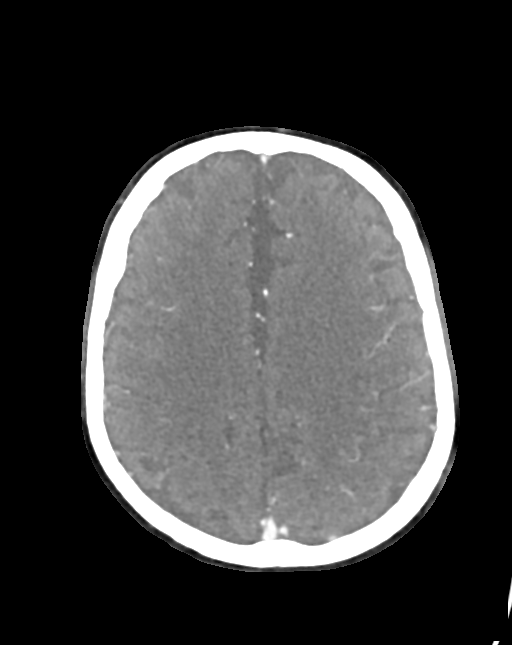

[5 of 35 positions shown; findings below may reference images not displayed]

FINDINGS: CT HEAD FINDINGS

Brain: Generalized age-related cerebral atrophy. No acute
intracranial hemorrhage. No acute large vessel territory infarct. No
mass lesion, midline shift or mass effect. No hydrocephalus or
extra-axial fluid collection.

Vascular: No hyperdense vessel. Scattered vascular calcifications
noted within the carotid siphons.

Skull: Scalp soft tissues and calvarium within normal limits.

Sinuses: Scattered mucosal thickening noted within the ethmoidal air
cells. Mastoid air cells are clear.

Orbits: Globes and orbital soft tissues demonstrate no acute
finding.

CTA NECK FINDINGS

Aortic arch: Visualized aortic arch of normal caliber with normal 3
vessel morphology. Moderate atheromatous change about the arch and
origin of the great vessels without hemodynamically significant
stenosis.

Right carotid system: Right CCA patent from its origin to the
bifurcation without stenosis. Scattered mixed calcified and
noncalcified plaque about the right bifurcation/proximal right ICA
with associated stenosis of up to 40% by NASCET criteria. Right ICA
patent distally without stenosis, dissection or occlusion.

Left carotid system: Left CCA patent from its origin to the
bifurcation without stenosis. Scattered calcified plaque about the
left bifurcation/proximal left ICA with associated stenosis of up to
40% by NASCET criteria. Left ICA otherwise patent distally to the
skull base without stenosis, dissection or occlusion.

Vertebral arteries: Both vertebral arteries arise from the
subclavian arteries. No proximal subclavian artery stenosis. Right
vertebral artery dominant and widely patent within the neck. Left
vertebral artery diffusely hypoplastic and is occluded at its
origin. Scant markedly irregular and attenuated distal
reconstitution seen within the V2 and V3 segments. The hypoplastic
left vertebral artery is faintly visible as it courses into the
cranial vault.

Skeleton: No acute osseous finding. Mild multilevel cervical
spondylosis without high-grade spinal stenosis. 8 mm sclerotic
lesion noted within the C3 vertebral body (series 13, image 102),
indeterminate, but stable as compared to previous CTs dating back to
[DATE], suggesting a benign bone island.

Other neck: No other acute soft tissue abnormality within the neck.
Few scattered subcentimeter thyroid nodules noted, largest of which
measures 8 mm on the right. Findings felt to be of doubtful
significance given size and patient age. No follow-up imaging
recommended regarding these lesions. No other mass or adenopathy.

Upper chest: Large layering left pleural effusion partially
visualized. Visualized upper chest demonstrates no other acute
finding.

Review of the MIP images confirms the above findings

CTA HEAD FINDINGS

Anterior circulation: Petrous segments widely patent bilaterally.
Scattered calcified atheromatous plaque within the carotid siphons
with associated mild multifocal stenosis. Persistent trigeminal
artery seen on the right. Tiny 1-2 mm focal outpouching arising from
the cavernous right ICA at the takeoff of the trigeminal artery
suspicious for a possible small aneurysm (series 11, image 119).
Focal atheromatous plaque at the mid aspect of the trigeminal artery
with associated short-segment mild stenosis (series 12, image 127).

A1 segments patent bilaterally. Normal anterior communicating artery
complex. Anterior cerebral arteries widely patent to their distal
aspects. No M1 stenosis or occlusion. Normal MCA bifurcations.
Distal MCA branches well perfused and symmetric.

Posterior circulation: Right V4 segment widely patent to the
vertebrobasilar junction. Right PICA origin patent and normal.
Hypoplastic left vertebral artery severely narrowed and attenuated
as it courses into the cranial vault, but is grossly patent to the
takeoff of the left PICA. Left PICA origin patent and perfused.
Hypoplastic left vertebral artery otherwise patent to the
vertebrobasilar junction beyond the takeoff of the left PICA.
Short-segment tandem severe stenoses noted involving the proximal
basilar artery (series 12, images 135, 132). The underlying proximal
basilar artery is diffusely narrowed and attenuated proximal to the
persistent trigeminal artery. Distally, the basilar artery is
otherwise widely patent to the basilar tip. Superior cerebral
arteries patent bilaterally. Both PCAs primarily supplied via the
basilar well perfused to their distal aspects.

Venous sinuses: Patent.

Anatomic variants: Persistent trigeminal artery on the right.

Review of the MIP images confirms the above findings
IMPRESSION: CT HEAD IMPRESSION:

Negative head CT for age.  No acute intracranial abnormality.

CTA HEAD AND NECK IMPRESSION:

1. Negative CTA for large vessel occlusion.
2. Hypoplastic left vertebral artery occluded at its origin, with
scant irregular and markedly attenuated distal reconstitution.
Dominant right vertebral artery widely patent to the vertebrobasilar
junction.
3. Persistent right trigeminal artery. Associated congenital
hypoplasia of the proximal basilar artery with superimposed severe
tandem stenoses as above.
4. 1-2 mm focal outpouching arising from the cavernous right ICA at
the takeoff of the right trigeminal artery, suspicious for a small
aneurysm.
5. 40% atheromatous stenoses about the carotid bifurcations/proximal
ICAs bilaterally.
6. Large layering left pleural effusion, partially visualized.

## 2020-02-15 MED ORDER — IOHEXOL 350 MG/ML SOLN
75.0000 mL | Freq: Once | INTRAVENOUS | Status: AC | PRN
  Administered 2020-02-15: 75 mL via INTRAVENOUS

## 2020-02-16 ENCOUNTER — Telehealth (INDEPENDENT_AMBULATORY_CARE_PROVIDER_SITE_OTHER): Payer: PPO | Admitting: Family Medicine

## 2020-02-16 ENCOUNTER — Other Ambulatory Visit: Payer: Self-pay

## 2020-02-16 DIAGNOSIS — R413 Other amnesia: Secondary | ICD-10-CM

## 2020-02-16 DIAGNOSIS — R42 Dizziness and giddiness: Secondary | ICD-10-CM | POA: Diagnosis not present

## 2020-02-16 DIAGNOSIS — S069X1A Unspecified intracranial injury with loss of consciousness of 30 minutes or less, initial encounter: Secondary | ICD-10-CM

## 2020-02-16 DIAGNOSIS — R2681 Unsteadiness on feet: Secondary | ICD-10-CM | POA: Diagnosis not present

## 2020-02-16 DIAGNOSIS — S069X1D Unspecified intracranial injury with loss of consciousness of 30 minutes or less, subsequent encounter: Secondary | ICD-10-CM

## 2020-02-16 NOTE — Progress Notes (Signed)
Subjective:    Patient ID: [name], male    DOB: 21-Aug-1933, 87 y.o.   MRN: [id number]  HPI  Patient is a very pleasant 87 year old Caucasian male being seen today as a telephone visit.  Both he and his wife are present on the phone call.  They consented to be seen via telephone.  Phone call began at 330.  Phone call concluded at 350.  The patient was seen in the emergency room on January 9 after suffering a fall.  Past medical history is significant for traumatic brain injury after falling down a flight of steps.  Since that time, the patient has had issues with memory loss, confusion, and frequent falls.  In the emergency room, they did a CTA of the head and the neck.  Patient was found to have 40% blockages in the carotid arteries bilaterally.  He was found to have a widely patent right vertebral artery.  He was found to have a hypoplastic left vertebral artery with reconstituted flow by the left PICA.  There was a coincidental finding of a 2 mm aneurysm in the right internal carotid artery.  However all of these were I believe most likely coincidental findings and unrelated to the patient's dizziness and falls.  The patient is primarily having orthostatic dizziness.  He loses his balance when he stands up quickly or changes position quickly.  This has been a significant issue for him ever since he suffered a traumatic brain injury when he fell down the steps.  He has an appointment to see the neurologist later this month to help differentiate between dementia and underlying memory loss due to traumatic brain injury.  He initially saw significant benefit from vestibular rehab.  He was released.  However he has started falling again and his wife questions if he would benefit from physical therapy. Past Medical History:  Diagnosis Date  . Allergy   . BPH (benign prostatic hyperplasia)   . BPH (benign prostatic hypertrophy)   . CAD (coronary artery disease)    s/p cypher DES to pLAD 6/08;  normal LVF;  ETT-Myoview 2009: no ischemia   . Coronary artery disease   . Eosinophilia   . High cholesterol   . HTN (hypertension)   . Hyperlipidemia   . Hypertension   . MI (myocardial infarction) ([place])   . Myocardial infarction ([place])   . Prediabetes   . Trigeminal neuralgia    Past Surgical History:  Procedure Laterality Date  . APPENDECTOMY    . CARDIAC CATHETERIZATION  07/30/2006   CORONARY ANGIOPLASTY WITH STENT PLACEMENT  . CARDIAC CATHETERIZATION    . EXPLORATORY LAPAROTOMY     age 16  . EXPLORATORY LAPAROTOMY    . IR THORACENTESIS ASP PLEURAL SPACE W/IMG GUIDE  10/05/2019   Current Outpatient Medications on File Prior to Visit  Medication Sig Dispense Refill  . albuterol (PROAIR HFA) 108 (90 Base) MCG/ACT inhaler INHALE 2 PUFFS INTO THE LUNGS EVERY 6 HOURS AS NEEDED FOR WHEEZING OR SHORTNESS OF BREATH (Patient taking differently: Inhale 2 puffs into the lungs every 6 (six) hours as needed for wheezing or shortness of breath.) 6.7 g 0  . amLODipine (NORVASC) 5 MG tablet Take 1 tablet (5 mg total) by mouth daily. 90 tablet 3  . Apoaequorin (PREVAGEN EXTRA STRENGTH PO) Take 1 tablet by mouth daily.    Marland Kitchen BREO ELLIPTA 200-25 MCG/INH AEPB INHALE 1 PUFF BY MOUTH EVERY DAY (Patient taking differently: Inhale 1 puff into the lungs daily.)  60 each 1  . Calcium Carb-Cholecalciferol (CALCIUM 600+D3 PO) Take 1 tablet by mouth 2 (two) times daily.    . diclofenac Sodium (VOLTAREN) 1 % GEL Apply 1 application topically daily as needed (pain).    Marland Kitchen doxazosin (CARDURA) 2 MG tablet TAKE 1 TABLET BY MOUTH EVERY DAY (Patient taking differently: Take 2 mg by mouth daily.) 90 tablet 3  . escitalopram (LEXAPRO) 10 MG tablet TAKE 1 TABLET BY MOUTH EVERY DAY (Patient taking differently: Take 10 mg by mouth daily.) 90 tablet 2  . ezetimibe (ZETIA) 10 MG tablet TAKE 1 TABLET BY MOUTH EVERY DAY (Patient taking differently: Take 10 mg by mouth at bedtime.) 90 tablet 3  . loratadine (CLARITIN) 10 MG  tablet Take 10 mg by mouth daily as needed for allergies or rhinitis.     . Multiple Vitamin (MULTIVITAMIN WITH MINERALS) TABS tablet Take 1 tablet by mouth daily.     No current facility-administered medications on file prior to visit.   Allergies  Allergen Reactions  . Gabapentin Other (See Comments)    Visual changes and confusion- "I went blind while I was driving"   Social History   Socioeconomic History  . Marital status: Married    Spouse name: [name]  . Number of children: 1  . Years of education: Not on file  . Highest education level: Not on file  Occupational History  . Occupation: retired professor    Comment: History   . Occupation: missionary  Tobacco Use  . Smoking status: Never Smoker  . Smokeless tobacco: Never Used  . Tobacco comment: quit over 71yrs ago.  Substance and Sexual Activity  . Alcohol use: Never  . Drug use: Never  . Sexual activity: Yes    Partners: Female  Other Topics Concern  . Not on file  Social History Narrative   ** Merged History Encounter **       Lives with his wife (second marriage in 2015, first marriage ended when his wife died alzheimer's disease). Since his remarriage, his adult daughter doesn't speak with him.   Social Determinants of Health   Financial Resource Strain: Not on file  Food Insecurity: Not on file  Transportation Needs: Not on file  Physical Activity: Not on file  Stress: Not on file  Social Connections: Not on file  Intimate Partner Violence: Not on file     Review of Systems  All other systems reviewed and are negative.      Objective:   Physical Exam        Assessment & Plan:  Dizziness - Plan: Ambulatory referral to Physical Therapy  Traumatic brain injury, with loss of consciousness of 30 minutes or less, subsequent encounter  Gait instability  Memory loss  At this point, I believe that the patient's dizziness and falls was related to vestibular issues status post his traumatic  brain injury.  I also believe that this is complicated by dementia.  I do not believe that the vascular lesions identified on the CT angiogram of the head and the neck are playing a role in his dizziness and falls.  He has an appointment to see a vascular surgeon and I would certainly appreciate their expert input however I believe we need to focus on conservative measures.  I would start the patient back on his aspirin and Lipitor to help prevent strokes due to his carotid artery stenosis.  I will consult neuro rehab/vestibular rehab due to his dizziness and gait instability and await neurology's  input later this month.

## 2020-02-20 ENCOUNTER — Other Ambulatory Visit: Payer: Self-pay

## 2020-02-20 ENCOUNTER — Ambulatory Visit
Admission: RE | Admit: 2020-02-20 | Discharge: 2020-02-20 | Disposition: A | Discharge status: Home / Self Care | Payer: PPO | Source: Ambulatory Visit | Attending: Family Medicine | Admitting: Family Medicine

## 2020-02-20 DIAGNOSIS — M4856XA Collapsed vertebra, not elsewhere classified, lumbar region, initial encounter for fracture: Secondary | ICD-10-CM | POA: Diagnosis not present

## 2020-02-20 DIAGNOSIS — N281 Cyst of kidney, acquired: Secondary | ICD-10-CM | POA: Diagnosis not present

## 2020-02-20 DIAGNOSIS — I714 Abdominal aortic aneurysm, without rupture: Secondary | ICD-10-CM | POA: Diagnosis not present

## 2020-02-20 DIAGNOSIS — K862 Cyst of pancreas: Secondary | ICD-10-CM | POA: Diagnosis not present

## 2020-02-20 IMAGING — MR MR ABDOMEN WO/W CM
11 of 18 series · 25 of 48 positions shown · IV contrast (16ml Multihance)
Comparison: [DATE] CT abdomen/pelvis.

CLINICAL DATA: Follow-up cystic pancreatic tail lesion.

EXAM:
MRI ABDOMEN WITHOUT AND WITH CONTRAST
TECHNIQUE: Multiplanar multisequence MR imaging of the abdomen was performed
both before and after the administration of intravenous contrast.
CONTRAST:  15mL MULTIHANCE GADOBENATE DIMEGLUMINE 529 MG/ML IV SOLN

[Series 2: cor haste · coronal · 5.0mm · 0.78mm/px · 2 of 39 slices shown]
[im 1/39]
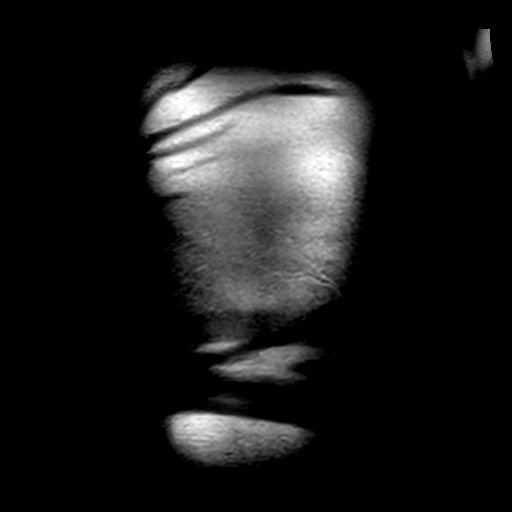
[im 39/39]
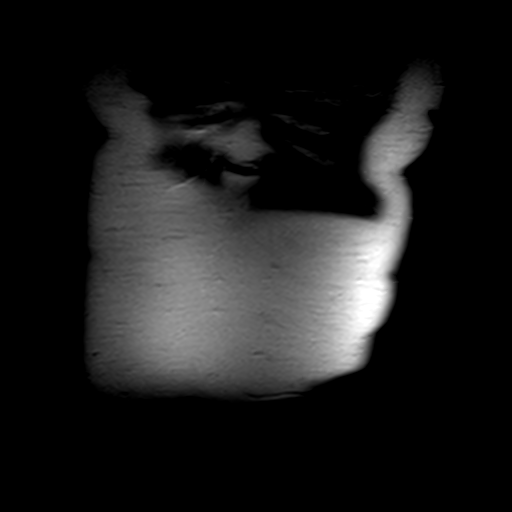

[Series 3: axial haste · axial · 6.0mm · 0.78mm/px · 1 of 38 slices shown]
[im 1/38]
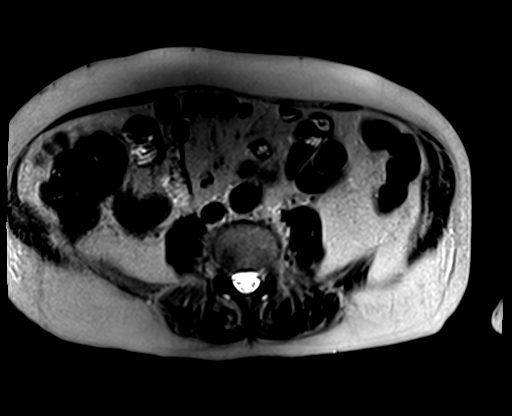

[Series 4: T1 · axial · 6.0mm · 0.78mm/px · z∈[-107,+130]mm · 3 of 74 slices shown]
[im 1/74]
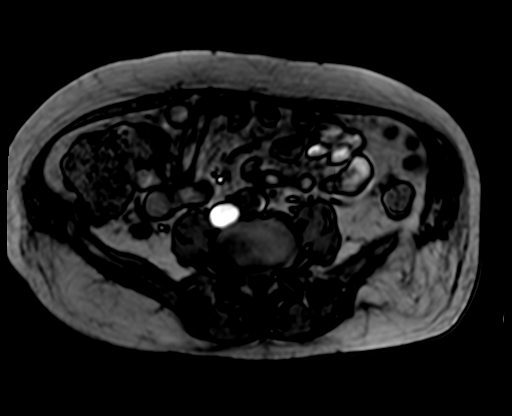
[im 37/74]
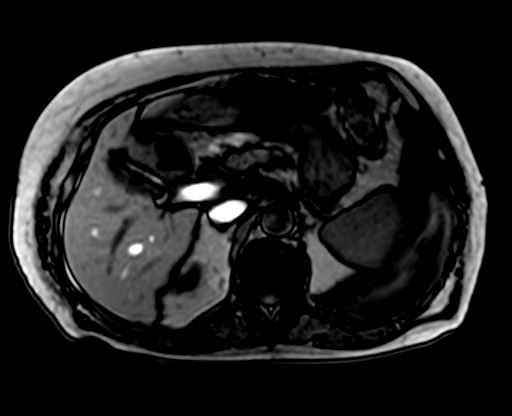
[im 74/74]
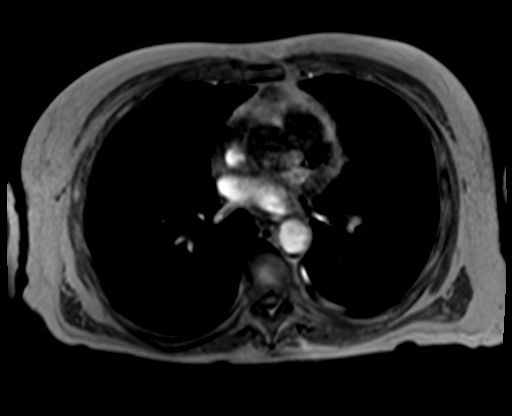

[Series 5: T2 · coronal · 3.0mm · 0.78mm/px · 3 of 70 slices shown (1 of 2)]
[im 1/70]
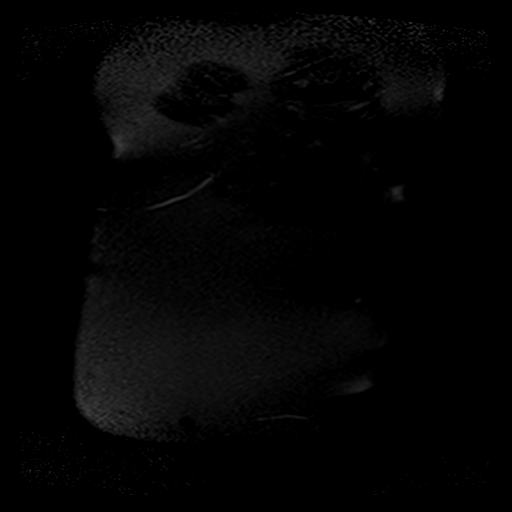
[im 35/70]
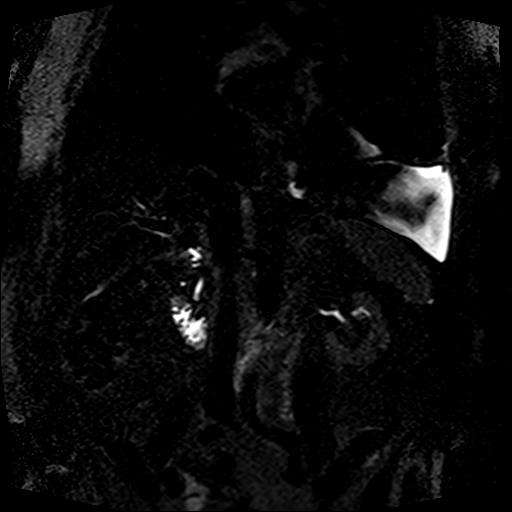
[im 70/70]
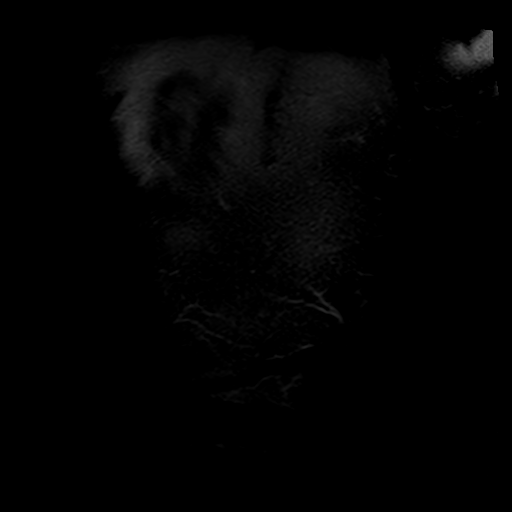

[Series 6: T2 · axial · 6.0mm · 1.12mm/px · 1 of 38 slices shown (2 of 2)]
[im 1/38]
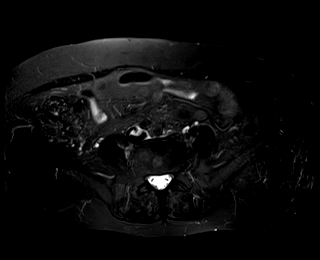

[Series 7: [id number]_trig · axial · 6.0mm · 1.88mm/px · z∈[-97,+114]mm · 4 of 99 slices shown]
[im 1/99]
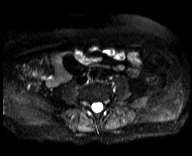
[im 33/99]
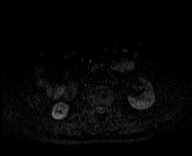
[im 66/99]
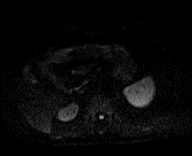
[im 99/99]
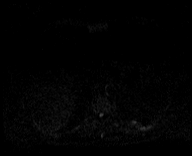

[Series 8: [id number]_trig_adc · axial · 6.0mm · 1.88mm/px · 1 of 33 slices shown]
[im 1/33]
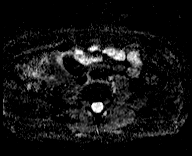

[Series 13: T1 dynamic · axial · non-contrast · 2.5mm · 0.78mm/px · z∈[-100,+117]mm · 3 of 88 slices shown]
[im 1/88]
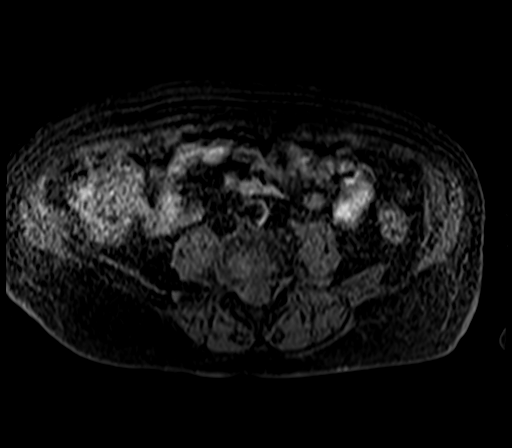
[im 44/88]
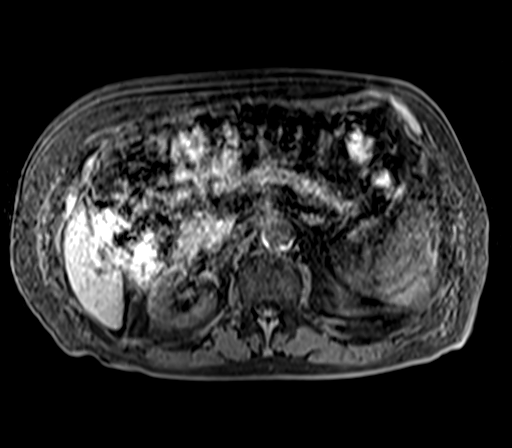
[im 88/88]
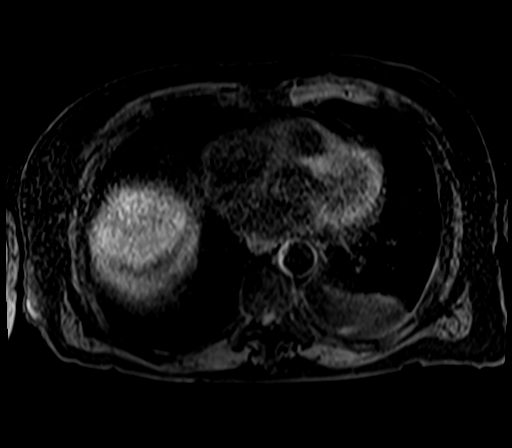

[Series 14: T1 dynamic post-contrast · axial · 2.5mm · 0.78mm/px · z∈[-100,+117]mm · 3 of 88 slices shown (1 of 3)]
[im 1/88]
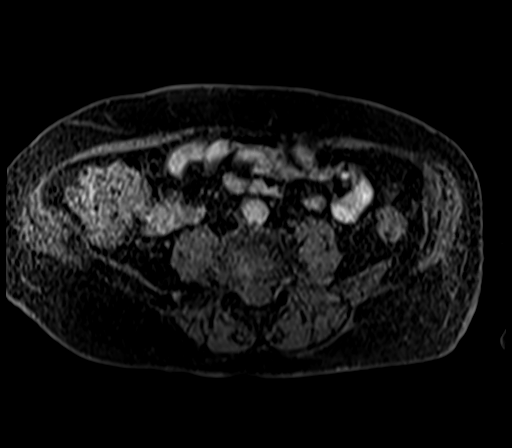
[im 44/88]
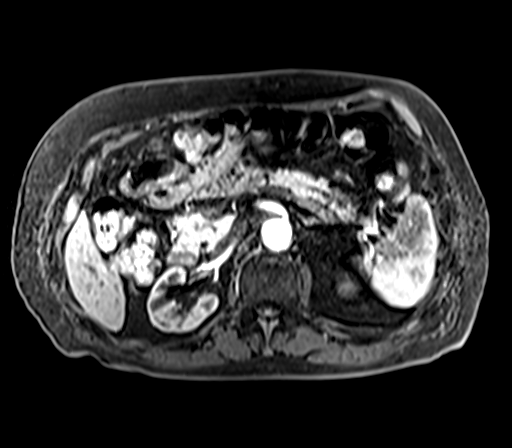
[im 88/88]
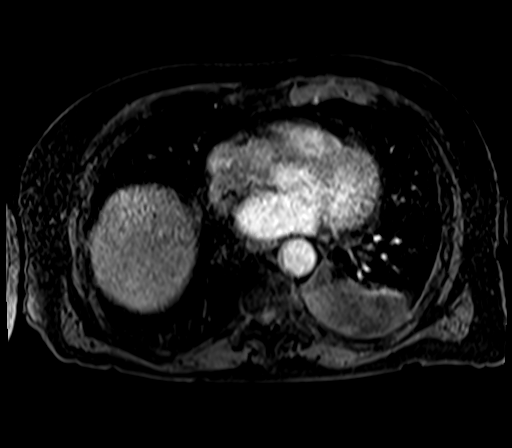

[Series 15: T1 dynamic post-contrast · axial · 2.5mm · 0.78mm/px · z∈[-100,+117]mm · 3 of 88 slices shown (2 of 3)]
[im 1/88]
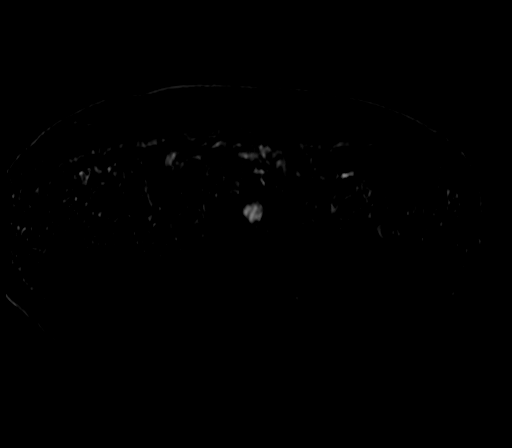
[im 44/88]
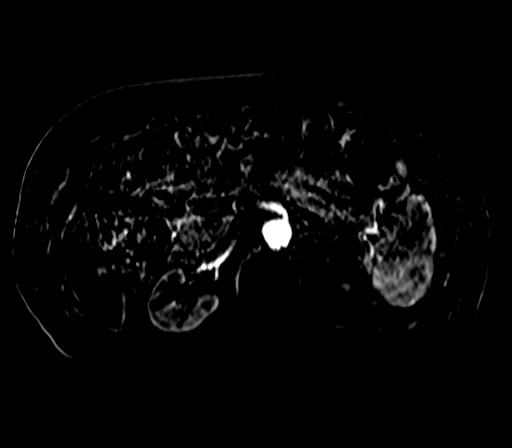
[im 88/88]
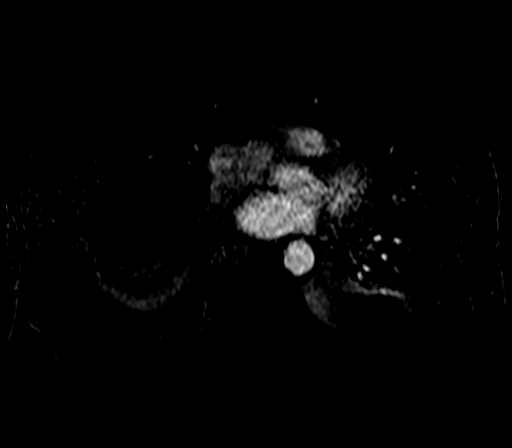

[Series 16: T1 dynamic post-contrast · axial · 2.5mm · 0.78mm/px · 1 of 88 slices shown (3 of 3)]
[im 1/88]
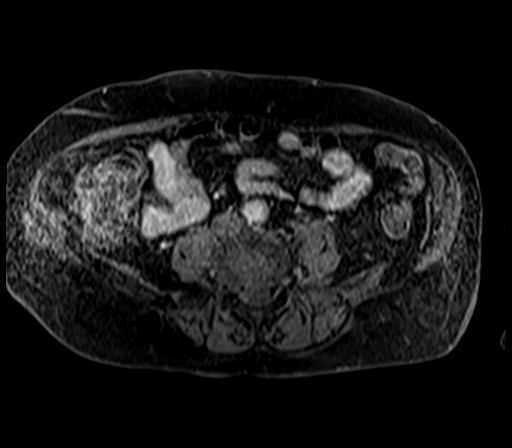

[25 of 48 positions shown; findings below may reference images not displayed]

FINDINGS: Lower chest: Small to moderate left pleural effusion.

Hepatobiliary: Normal liver size and configuration. No hepatic
steatosis. No liver mass. Normal gallbladder with no cholelithiasis.
No biliary ductal dilatation. Common bile duct diameter 4 mm. No
choledocholithiasis. No biliary masses, strictures or beading.

Pancreas: There is a 2.5 x 2.0 cm cystic pancreatic tail mass
(series 3/image 27) with lobular outer contour and thin internal
septation, without significant wall thickening, septal thickening or
solid enhancement, appearing to communicate directly with the main
pancreatic duct on the MRCP sequence, previously 2.3 x 1.6 cm on
[DATE] CT using similar measurement technique, mildly increased.
No significant pancreatic duct dilation (2 mm diameter). No
additional pancreatic lesions. No pancreas divisum.

Spleen: Normal size. No mass.

Adrenals/Urinary Tract: Normal adrenals. No hydronephrosis.
Scattered small simple bilateral renal cortical cysts, largest
cm medially in the upper left kidney. No suspicious renal masses.

Stomach/Bowel: Normal non-distended stomach. Visualized small and
large bowel is normal caliber, with no bowel wall thickening.

Vascular/Lymphatic: Atherosclerotic abdominal aorta with stable
cm infrarenal abdominal aortic aneurysm. Patent portal, splenic,
hepatic and renal veins. No pathologically enlarged lymph nodes in
the abdomen.

Other: No abdominal ascites or focal fluid collection.

Musculoskeletal: No aggressive appearing focal osseous lesions.
Moderate L3 vertebral compression fracture, unchanged since
[DATE] lumbar spine MRI.
IMPRESSION: 1. Slowly growing 2.5 x 2.0 cm septated cystic pancreatic tail mass
communicating with the nondilated main pancreatic duct, compatible
with a side branch IPMN. No high risk MRI features. Suggest
follow-up MRI abdomen without and with IV contrast in 6-12 months
given mild growth.
2. Small to moderate left pleural effusion.
3. Infrarenal 3.1 cm abdominal aortic aneurysm. Recommend follow-up
ultrasound every 3 years. This recommendation follows ACR consensus
guidelines: White Paper of the ACR Incidental Findings Committee II
4. Moderate L3 vertebral compression fracture, unchanged since
[DATE] lumbar spine MRI.

## 2020-02-20 MED ORDER — GADOBENATE DIMEGLUMINE 529 MG/ML IV SOLN
15.0000 mL | Freq: Once | INTRAVENOUS | Status: AC | PRN
  Administered 2020-02-20: 15 mL via INTRAVENOUS

## 2020-02-29 ENCOUNTER — Ambulatory Visit: Payer: PPO

## 2020-02-29 ENCOUNTER — Ambulatory Visit: Payer: PPO | Admitting: Psychology

## 2020-02-29 ENCOUNTER — Encounter: Payer: Self-pay | Admitting: Psychology

## 2020-02-29 ENCOUNTER — Encounter: Payer: Self-pay | Admitting: Neurology

## 2020-02-29 ENCOUNTER — Other Ambulatory Visit: Payer: Self-pay

## 2020-02-29 DIAGNOSIS — G500 Trigeminal neuralgia: Secondary | ICD-10-CM | POA: Insufficient documentation

## 2020-02-29 DIAGNOSIS — I2510 Atherosclerotic heart disease of native coronary artery without angina pectoris: Secondary | ICD-10-CM | POA: Insufficient documentation

## 2020-02-29 DIAGNOSIS — F0390 Unspecified dementia without behavioral disturbance: Secondary | ICD-10-CM | POA: Diagnosis not present

## 2020-02-29 DIAGNOSIS — I219 Acute myocardial infarction, unspecified: Secondary | ICD-10-CM | POA: Insufficient documentation

## 2020-02-29 DIAGNOSIS — I609 Nontraumatic subarachnoid hemorrhage, unspecified: Secondary | ICD-10-CM | POA: Diagnosis not present

## 2020-02-29 DIAGNOSIS — S069X9S Unspecified intracranial injury with loss of consciousness of unspecified duration, sequela: Secondary | ICD-10-CM | POA: Diagnosis not present

## 2020-02-29 DIAGNOSIS — R4189 Other symptoms and signs involving cognitive functions and awareness: Secondary | ICD-10-CM

## 2020-02-29 HISTORY — DX: Unspecified dementia, unspecified severity, without behavioral disturbance, psychotic disturbance, mood disturbance, and anxiety: F03.90

## 2020-02-29 NOTE — Progress Notes (Signed)
   Psychometrician Note   Cognitive testing was administered to [employer] by Cruzita Lederer, B.S. (psychometrist) under the supervision of Dr. Christia Reading, Ph.D., licensed psychologist on 02/29/2020. Mr. Hasting did not appear overtly distressed by the testing session per behavioral observation or responses across self-report questionnaires. Dr. Christia Reading, Ph.D. checked in with Mr. Benavides as needed to manage any distress related to testing procedures (if applicable). Rest breaks were offered.    The battery of tests administered was selected by Dr. Christia Reading, Ph.D. with consideration to Mr. Koller's current level of functioning, the nature of his symptoms, emotional and behavioral responses during interview, level of literacy, observed level of motivation/effort, and the nature of the referral question. This battery was communicated to the psychometrist. Communication between Dr. Christia Reading, Ph.D. and the psychometrist was ongoing throughout the evaluation and Dr. Christia Reading, Ph.D. was immediately accessible at all times. Dr. Christia Reading, Ph.D. provided supervision to the psychometrist on the date of this service to the extent necessary to assure the quality of all services provided.    Markham Bernadene Person will return within approximately 1-2 weeks for an interactive feedback session with [name] at which time his test performances, clinical impressions, and treatment recommendations will be reviewed in detail. Mr. Puccinelli understands he can contact our office should he require our assistance before this time.  A total of 130 minutes of billable time were spent face-to-face with Mr. Ransom by the psychometrist. This includes both test administration and scoring time. Billing for these services is reflected in the clinical report generated by Dr. Christia Reading, Ph.D..  This note reflects time spent with the psychometrician and does not include test scores  or any clinical interpretations made by [name]. The full report will follow in a separate note.

## 2020-02-29 NOTE — Progress Notes (Signed)
NEUROPSYCHOLOGICAL EVALUATION [name]. [facility] Department [facility]rology  Date of Evaluation: February 29, 2020  Reason for Referral:   [name] [name] is [name] 87 y.o. right-handed Caucasian male referred by [name], M.D., to characterize his current cognitive functioning and assist with diagnostic clarity and treatment planning in the context of cognitive dysfunction stemming from several recent falls, as well as concerns for an underlying neurodegenerative condition.   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 Additional impairments were seen across aspects of executive functioning (i.e., cognitive flexibility and response inhibition), while performance variability was exhibited across attention/concentration, expressive language, and visuospatial abilities. Basic attention, processing speed, verbal reasoning (including safety/judgment), receptive language, and phonemic fluency were largely appropriate. 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 This, coupled with evidence for significant cognitive dysfunction described above, suggests that he meets criteria for [name] Major Neurocognitive Disorder ("dementia") at the present time. However, he is likely towards the mild end of this spectrum at present as medication management was reported to be adequate and he does have insight into some cognitive limitations.    Regarding etiology, I have concerns surrounding late-onset Alzheimer's disease. Across memory measures, Dr. Autumn [name] was fully amnestic after [name] delay and exhibited very poor accuracy across yes/no recognition trials. This suggests evidence for [name] memory storage deficit, which is the  hallmark characteristic of this condition. Impairment in executive functioning, [name] relative weakness in semantic fluency related to phonemic fluency, and variability across confrontation naming and visuospatial abilities are further consistent with this presentation. In addition to this, there may further be [name] vascular component given numerous cardiovascular conditions and neuroimaging suggesting vertebral blockages and small vessel ischemic changes (i.e., [name] "mixed dementia" presentation). However, cognitive patterns are more concerning for Alzheimer's disease. Regarding his prior TBI, it is more likely that the presence of this injury exacerbated deficits that were already present and worsening due to an underlying cognitive disorder rather than this injury being primarily responsible for the aforementioned dysfunction. His pattern of performance is inconsistent with what would be expected from his TBI alone with memory dysfunction being far more severe. Continued medical monitoring will be important moving forward.  Recommendations: [name] repeat neuropsychological evaluation in 12-18 months (or sooner if functional decline is noted) is recommended to assess the trajectory of future cognitive decline should it occur. This will also aid in future efforts towards improved diagnostic clarity.  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 I would recommend that he meet with [name] neurologist to discuss treatment options. He would likely benefit from starting an acetylcholinesterase inhibitor such as donepezil/Aricept. This class of medication has been shown to slow functional decline in some individuals. However, it is important to highlight that no current treatment can stop or reverse decline in the face of [name] neurodegenerative illness. I will defer to his future neurologist regarding the necessity of continued PT/vestibular therapy and vision-related examinations.   Dr. Autumn [name]' wife  noted that since he sustained orthopedic damage to one side of his body stemming from one of his falls, he has become [name] back sleeper. During this time, she reported that he will commonly be heard gasping for air like he is having trouble breathing while asleep. He also reported only obtaining 4-5 hours  of broken sleep each night and fatigues easily during the day. He and his medical team could discuss [name] referral for [name] laboratory or at-home sleep study to assess for obstructive sleep apnea. If present and left untreated, this would could worsen cognitive dysfunction while also increasing his risk for heart attack and stroke.  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 Therefore, it is recommended that he remain as involved as possible in all aspects of household chores, finances, and medication management, with supervision to ensure adequate performance. He will likely benefit from the establishment and maintenance of [name] routine in order to maximize his functional abilities over time.  It will be important for Dr. Autumn Messing to have another person with him when in situations where he may need to process information, weigh the pros and cons of different options, and make decisions, in order to ensure that he fully understands and recalls all information to be considered.  If not already done, Dr. Autumn Messing and his family may want to discuss his wishes regarding durable power of attorney and medical decision making, so that he can have input into these choices. Additionally, they may wish to discuss future plans for caretaking and seek out community options for in home/residential care should they become necessary.  [name] combination of medication and psychotherapy has been shown to be most effective at treating symptoms of anxiety and depression. As such, [name] is encouraged to speak with his prescribing physician  regarding medication adjustments to optimally manage these symptoms. Likewise, Mr. Mayernik is encouraged to consider engaging in short-term psychotherapy to address symptoms of psychiatric distress. he would benefit from an active and collaborative therapeutic environment, rather than one purely supportive in nature. Recommended treatment modalities include Cognitive Behavioral Therapy (CBT) or Acceptance and Commitment Therapy (ACT).  Performance across neurocognitive testing is not [name] strong predictor of an individual's safety operating [name] motor vehicle. 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 Should his family wish to pursue [name] formalized driving evaluation, they would be encouraged to contact [employer] in [place], [place] at [phone]. Another option would be through [facility]; however, the latter would likely require [name] referral from [name] medical doctor. [facility] can be reached directly at [phone].   Important information to remember should be presented in written format in all instances. This should be placed in [name] highly visible and commonly frequented area of his residence to try and help promote recall.   To address problems with fluctuating attention, he may wish to consider:   -Avoiding external distractions when needing to concentrate   -Limiting exposure to fast paced environments with multiple sensory demands   -Writing down complicated information and using checklists   -Attempting and completing one task at [name] time (i.e., no multi-tasking)   -Verbalizing aloud each step of [name] task to maintain focus   -Taking frequent breaks during the completion of steps/tasks to avoid fatigue   -Reducing the amount of information considered at one time   -Scheduling more difficult activities for [name] time of day where he is usually most  alert  Review of Records:   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 [name] history of [name] few falls was  reported prior to this event. Workup showed [name] distal left clavicle fracture, left thumb fracture, left rib fractures 1-7, and subarachnoid hemorrhage. The patient was admitted for observation, pain control, and consults with neurosurgery and orthopedic surgery. Specific to his TBI, neurosurgery did not have plans for repeat imaging unless that patient changed neurologically. He was reporting forgetfulness at that time. OT/ST/PT services were ordered. He was ultimately discharged on 07/23/2019.  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 He reportedly had [name] UA at that time and was discharged home on 08/20/2019. He was seen in the ED on 10/30/2019 for ongoing unproductive cough, fever, and dyspnea. Chest x-ray showed slight worsening of prior left pleural effusion. COVID-19 testing was negative. He again presented with similar symptoms on 11/01/2019. He was seen in the ED on 12/24/2019 following [name] fall. He reported missing [name] step while walking that caused him to fall to the right side. [name] bush reportedly helped break his fall but the right side of his head did hit the foundation of the house. He denied [name] loss of consciousness but did endorse [name] mild headache and right shoulder pain at that time. He was seen in the ED on 01/10/2020 with concerns of increasing confusion and forgetfulness, frequent right-sided headaches, and [name] clicking noise in his neck. Symptoms were said to originate from his 12/24/2019 fall. Finally, he was seen in the ED on 02/14/2020 due to complaints of dizziness. He reported that when he gets up to move quickly, he feels dizzy. Symptoms sometimes cause him to fall. Intention tremor was noted on his medical exam; otherwise, cranial nerves II through XII were said to be intact. He  also reported severe neck pain which seems to come and go.  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 Briefly, he has [name] history of several falls and sustained [name] traumatic brain injury after falling down [name] flight of steps this past June. Since that time, he has reported issues with memory loss, confusion, and frequent falls. While in the emergency room on 02/14/2020, [name] head/neck CTA was performed. Results suggested 40% blockages in the carotid arteries bilaterally. 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 There was additionally [name] coincidental finding of [name] 2 mm aneurysm in the right internal carotid artery. He was primarily reporting orthostatic dizziness during follow-up with Dr. Dennard Schaumann. [name] reported losing his balance when he stands up or changes position quickly. This has been [name] significant issue for him particularly since suffering his traumatic brain injury. He initially saw significant benefit from outpatient vestibular rehabilitation. Ultimately, [name] was referred for [name] comprehensive neuropsychological evaluation to characterize his cognitive abilities and to assist with diagnostic clarity and treatment planning.   Head CT on 07/14/2019 in the context of [name] fall revealed [name] small subarachnoid hemorrhage seen on the left and best visualized along the left insula and high left parietal lobe. Head CT on 08/18/2019 was said to reveal [name] left hemispheric subdural hemorrhage, new since the prior CT, as well as moderate age-related atrophy and chronic microvascular ischemic changes. Head CT on 08/20/2019 was stable relative to the scan on 08/18/2019. Head CT on 10/21/2019 revealed interval resolution of left-sided subdural hematoma, as well as age-related changes consistent with what has already been described. Head CT on  12/24/2019 was negative  for acute intracranial abnormalities. Head CT on 01/10/2020 was negative for acute intracranial abnormalities. His head CT/CTA on 02/14/2020 and 02/15/2020 are described above.   Past Medical History:  Diagnosis Date  . Acute blood loss anemia   . Acute upper respiratory infections of unspecified site 11/04/2012  . AKI (acute kidney injury)   . Benign prostatic hyperplasia   . CAD (coronary artery disease)    s/p cypher DES to pLAD 6/08; normal LVF;  ETT-Myoview 2009: no ischemia   . Clavicle fracture 08/10/2019  . Closed displaced fracture of phalanx of left thumb, sequela 08/10/2019  . Coronary atherosclerosis of native coronary artery 03/08/2008  . Eosinophilia   . Essential hypertension   . Fracture of multiple ribs with pain 08/10/2019  . History of multiple falls   . History of SAH (subarachnoid hemorrhage) 07/14/2019  . Hyperlipidemia type IIB / III 03/08/2008  . MI (myocardial infarction)   . 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 Nocturnal leg cramps 11/23/2012  . Orthostatic hypotension 03/08/2008  . Pain in joint, shoulder region 11/23/2012  . Stage 3b chronic kidney disease   . Thrombocytopenia   . Trigeminal neuralgia   . Type 2 diabetes mellitus     Past Surgical History:  Procedure Laterality Date  . APPENDECTOMY    . CARDIAC CATHETERIZATION  07/30/2006   CORONARY ANGIOPLASTY WITH STENT PLACEMENT  . CARDIAC CATHETERIZATION    . EXPLORATORY LAPAROTOMY     age 41  . EXPLORATORY LAPAROTOMY    . IR THORACENTESIS ASP PLEURAL SPACE W/IMG GUIDE  10/05/2019    Current Outpatient Medications:  .  albuterol (PROAIR HFA) 108 (90 Base) MCG/ACT inhaler, INHALE 2 PUFFS INTO THE LUNGS EVERY 6 HOURS AS NEEDED FOR WHEEZING OR SHORTNESS OF BREATH (Patient taking differently: Inhale 2 puffs into the lungs every 6 (six) hours as needed for wheezing or shortness of breath.), Disp: 6.7 g, Rfl: 0 .  amLODipine (NORVASC) 5 MG tablet,  Take 1 tablet (5 mg total) by mouth daily., Disp: 90 tablet, Rfl: 3 .  Apoaequorin (PREVAGEN EXTRA STRENGTH PO), Take 1 tablet by mouth daily., Disp: , Rfl:  .  BREO ELLIPTA 200-25 MCG/INH AEPB, INHALE 1 PUFF BY MOUTH EVERY DAY (Patient taking differently: Inhale 1 puff into the lungs daily.), Disp: 60 each, Rfl: 1 .  Calcium Carb-Cholecalciferol (CALCIUM 600+D3 PO), Take 1 tablet by mouth 2 (two) times daily., Disp: , Rfl:  .  diclofenac Sodium (VOLTAREN) 1 % GEL, Apply 1 application topically daily as needed (pain)., Disp: , Rfl:  .  doxazosin (CARDURA) 2 MG tablet, TAKE 1 TABLET BY MOUTH EVERY DAY (Patient taking differently: Take 2 mg by mouth daily.), Disp: 90 tablet, Rfl: 3 .  escitalopram (LEXAPRO) 10 MG tablet, TAKE 1 TABLET BY MOUTH EVERY DAY (Patient taking differently: Take 10 mg by mouth daily.), Disp: 90 tablet, Rfl: 2 .  ezetimibe (ZETIA) 10 MG tablet, TAKE 1 TABLET BY MOUTH EVERY DAY (Patient taking differently: Take 10 mg by mouth at bedtime.), Disp: 90 tablet, Rfl: 3 .  loratadine (CLARITIN) 10 MG tablet, Take 10 mg by mouth daily as needed for allergies or rhinitis. , Disp: , Rfl:  .  Multiple Vitamin (MULTIVITAMIN WITH MINERALS) TABS tablet, Take 1 tablet by mouth daily., Disp: , Rfl:   Clinical Interview:   The following information was obtained during [name] clinical interview with [name] and his wife prior to  cognitive testing.  Cognitive Symptoms: Decreased short-term memory: Endorsed. He reported largely generalized difficulties with short-term memory. His wife noted that he will ask repetitive questions, need frequent reminders, and have trouble recalling the names of familiar individuals. Symptoms were said to be quite noticeable since his TBI this past June. However, there were mild concerns surrounding cognitive issues prior to this. Difficulties were said to follow an ebb and flow sort of pattern.  Decreased long-term memory: Denied. Decreased attention/concentration:  Endorsed. He acknowledged that his mind will sometimes wander depending on what he is trying to focus on. His wife noted that he seems more prone to distraction, especially during conversations.  Reduced processing speed: Unclear. He did not report noticing slowed processing speed. His wife reported that his prior speech therapist noted concerns surrounding reaction time.  Difficulties with executive functions: Endorsed. Trouble with organization was said to be longstanding in nature but mildly worsened since June. His wife reported that he seems somewhat slower when making decisions and there has been some concerns surrounding using good judgment. The latter was said to have improved since June; however, he does require her to explicitly talk him through various activities at times.  Difficulties with emotion regulation: Denied. This was noted to be present immediately following his TBI in June. However, this has improved and was likely related to frustration surrounding [name] drop in functional independence.  Difficulties with receptive language: Denied. Difficulties with word finding: Endorsed "once in [name] while."  Decreased visuoperceptual ability: Endorsed. His wife reported potential trouble with depth perception, stating that some of his falls have been due to mis-stepping or misperceiving steps/curbs while walking.   Difficulties completing ADLs: Endorsed. His wife stated she took over medication management following his TBI. He progressed to where he now completes these tasks independently; however, she always double checks him. She has taken over financial management due to his inability to write checks or perform other related actions. He stopped driving after being asked to do so by his physician. This reportedly was influenced by reaction speed concerns first posed by his speech therapist.   Additional Medical History: History of traumatic brain injury/concussion: Endorsed. As described above, he  reported falling with positive head impacts in June and November 2021. He sustained [name] small subarachnoid hemorrhage associated with the former fall.  History of stroke: Denied. History of seizure activity: Denied. History of known exposure to toxins: Denied. Symptoms of chronic pain: Denied. However, his wife reminded him of longstanding back, right shoulder, and neck pain. He agreed with this assessment after being provided this reminder.  Experience of frequent headaches/migraines: Denied. However, his wife did report instances where he describes an "ice pick" sensation impacting his ear. This was said to occur infrequently but has been occurring more often since his TBI.  Frequent instances of dizziness/vertigo: Endorsed. He reported frequently experiencing dizziness sensations when bending down and standing back up. This is likely to be related to his history of orthostatic hypotension.   Sensory changes: He largely denied significant visual acuity concerns. His wife noted that he is likely overdue for an eye examination. He denied changes in his hearing, taste, or smell. His wife wondered about hearing loss, stating that he often asks her to repeat herself.  Balance/coordination difficulties: Endorsed. Ongoing balance instability represented [name] chief concern of both [name] and his wife. She described [name] falling [name] total of five times in the past few months. These falls were attributed to [name] combination of factors, including  dizziness (thought to be related to orthostatic hypotension) and depth perception issues. On several occassions, they were also attributed to [name] "rushing" (e.g., moving forward quickly before fully standing up). He denied specific lower-extremity weakness or one side of the body exhibiting greater instability. His wife stated that perhaps the right side seems [name] bit weaker.  Other motor difficulties: Endorsed. He reported [name] very subtle intention tremor in his  right hand.   Sleep History: Estimated hours obtained each night: 4-5 hours.  Difficulties falling asleep: Denied. Difficulties staying asleep: Endorsed. He reported waking frequently throughout the night. This was partially due to needing to use the restroom as he has been increasing his water intake throughout the day per medical instructions. He also noted consistently waking up around 1:00-2:00am, sometimes for an unknown reason. He noted then having difficulties falling back asleep.  Feels rested and refreshed upon awakening: Endorsed. However, he does appear to fatigue throughout the day. His wife noted that he can quickly fall asleep while watching television or when sitting in his chair.   History of snoring: Endorsed. History of waking up gasping for air: Endorsed. Witnessed breath cessation while asleep: Endorsed. 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 His wife noted that he will commonly gasp for air like he is unable to breath well while sleeping on his back. She expressed concerns surrounding the presence of sleep apnea. She noted that this has not been formally assessed as other symptoms/conditions have taken [name] priority.   History of vivid dreaming: Denied. Excessive movement while asleep: Denied. Instances of acting out his dreams: Denied.  Psychiatric/Behavioral Health History: Depression: He described his mood as "not good" and noted that he feels more impatient and has "said things in anger that I would never have said before and deeply regret later." His wife attributed much of this to frustration surrounding his relatively quick loss in functional independence and noted her perception that he seems to be coping with this well given the circumstances. He denied [name] history of depression or any mental health concerns. He is currently prescribed an anti-depressant medication. His wife  thought that this may be for off-label use. Current or remote suicidal ideation, intent, or plan was denied.  Anxiety: Denied. Mania: Denied. Trauma History: Denied. Visual/auditory hallucinations: Denied. However, his wife did note that he will "sense" things conversationally (e.g., the presence of [name] second cat, additional individual, or additional room in their home). They both stated that these were not seen visually and therefore did not represent fully-formed hallucinations. It was more of [name] mental sense that these things were present when in fact they were not.  Delusional thoughts: Denied.  Tobacco: Denied. Alcohol: He denied current alcohol consumption as well as [name] history of problematic alcohol abuse or dependence.  Recreational drugs: Denied. Caffeine: His wife reported an increase in recent caffeine consumption, including greater consumption in the afternoon. Intake is up to 4-5 cups of coffee per day.   Family History: Problem Relation Age of Onset  . Stomach cancer Mother   . Memory loss Mother   . Heart disease Father   . Coronary artery disease Father 29  . Heart attack Father   . Heart disease Brother   . Aortic aneurysm Brother   . Colon cancer Brother   . Aortic aneurysm Brother   . Colon cancer Brother   . Arthritis Daughter  rheumatoid  . Coronary artery disease Brother   . Esophageal cancer Neg Hx   . Rectal cancer Neg Hx    This information was confirmed by [name].  Academic/Vocational History: Highest level of educational attainment: 20 years. He grew up in [place] and moved to the [place] in the 1960s. 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 He also reported being able to converse in numerous languages, including [place] and ancient [place]. He described himself as [name] Production designer, theatre/television/film in academic settings.  History of developmental delay: Denied. History of  grade repetition: Denied. Enrollment in special education courses: Denied. History of LD/ADHD: Denied.  Employment: Retired. 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 He appeared alert and oriented. He ambulated with the assistance of [name] 4-pronged cane. There were [name] few instances where he appeared to move [name] bit quicker than was prudent. [name] very slight intention tremor was exhibited in his right hand; this was only witnessed after he held up his hands to demonstrate this. His affect was generally relaxed and positive, but did range appropriately given the subject being discussed during the clinical interview or the task at hand during testing procedures. Spontaneous speech was fluent and word finding difficulties were not observed during the clinical interview. Thought processes were coherent, organized, and normal in content. Insight into his cognitive difficulties appeared adequate; however, he may not appreciate the full extent of cognitive impairment. He regularly deferred to his wife to answer questions which he was unable to remember or to provide her perspective. He was also very agreeable with her perspective. During testing, sustained attention was appropriate. Task engagement was adequate and he generally persisted when challenged. However, there was one task (TMT B) which he discontinued prematurely due to the task making him fatigue. 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   Adequacy of Effort: The validity of neuropsychological testing is limited by the extent to which the individual  being tested may be assumed to have exerted adequate effort during testing. [name] expressed his intention to perform to the best of his abilities and exhibited adequate task engagement and persistence. Scores across stand-alone and embedded performance validity measures were within expectation. As such, the results of the current evaluation are believed to be [name] valid representation of Dr. Mervin Kung current cognitive functioning.  Test Results: [name] was poorly oriented at the time of the current evaluation. He was unable to state his full address and was unaware of his phone number. He also incorrectly stated the current month, date, and time.   Intellectual abilities based upon educational and vocational attainment were estimated to be in the above average to well above average range. Premorbid abilities were estimated to be within the well above average range based upon [name] single-word reading test.   Processing speed was mildly variable, ranging from the below average to above average normative ranges. Basic attention was average. More complex attention (e.g., working memory) was well below average to average. Executive functioning was variable. Reasoning abilities (including those surrounding safety and judgment) were  average to above average. However, impairments were seen across cognitive flexibility and response inhibition.  Assessed receptive language abilities were below average. Despite this, [name] did not exhibit any difficulties comprehending task instructions and answered all questions asked of him appropriately. Assessed expressive language was variable. Phonemic fluency was above average and semantic fluency was well below average to average. Confrontation naming was average across [name] screening instrument but well below average across [name] more comprehensive assessment.     Assessed visuospatial abilities Primary school teacher) were exceptionally low. Visuoconstructional  abilities were average. Points were lost on his drawing of [name] clock due to very mild numerical spatial abnormalities, as well as incorrect hand placement. Points were lost on his drawing of [name] complex figure due to some visual distortions, as well as the omission of one aspect altogether.    Learning (i.e., encoding) of novel verbal information was exceptionally low to well below average. Spontaneous delayed recall (i.e., retrieval) of previously learned information was exceptionally low. Retention rates were 0% across [name] story learning task, 0% across [name] list learning task, and 0% across [name] complex figure drawing task. Performance across recognition tasks was exceptionally low to well below average, suggesting minimal evidence for information consolidation.   Results of emotional screening instruments suggested that recent symptoms of generalized anxiety were in the minimal range, while symptoms of depression were within the mild range. [name] screening instrument assessing recent sleep quality suggested the presence of minimal sleep dysfunction.  Tables of Scores:   Note: This summary of test scores accompanies the interpretive report and should not be considered in isolation without reference to the appropriate sections in the text. Descriptors are based on appropriate normative data and may be adjusted based on clinical judgment. The terms "impaired" and "within normal limits (WNL)" are used when [name] more specific level of functioning cannot be determined.       Effort Testing:   DESCRIPTOR       Dot Counting Test: --- --- Within Expectation  RBANS Effort Index: --- --- Within Expectation  WAIS-IV Reliable Digit Span: --- --- Within Expectation  D-KEFS Color Word Effort Index: --- --- Within Expectation       Orientation:      Raw Score Percentile   NAB Orientation, Form 1 20/29 --- ---       Cognitive Screening:           Raw Score Percentile   SLUMS: 17/30 --- ---       RBANS, Form [name]: Standard  Score/ Scaled Score Percentile   Total Score 65 1 Exceptionally Low  Immediate Memory 61 <1 Exceptionally Low    List Learning 4 2 Well Below Average    Story Memory 3 1 Exceptionally Low  Visuospatial/Constructional 72 3 Well Below Average    Figure Copy 8 25 Average    Line Orientation 7/20 <2 Exceptionally Low  Language 97 42 Average    Picture Naming 10/10 >75 Above Average    Semantic Fluency 8 25 Average  Attention 85 16 Below Average    Digit Span 9 37 Average    Coding 6 9 Below Average  Delayed Memory 48 <1 Exceptionally Low    List Recall 0/10 <2 Exceptionally Low    List Recognition 14/20 <2 Exceptionally Low    Story Recall 1 <1 Exceptionally Low    Story Recognition 6/12 5-6 Well Below Average    Figure Recall 1 <1 Exceptionally Low    Figure Recognition 0/8 <1 Exceptionally Low  Intellectual Functioning:           Standard Score Percentile   Test of Premorbid Functioning: 127 96 Well Above Average       Attention/Executive Function:          Trail Making Test (TMT): Raw Score (T Score) Percentile     Part [name] 89 secs.,  0 errors (23) <1 Exceptionally Low    Part B Discontinued --- Impaired         Scaled Score Percentile   WAIS-IV Digit Span: 7 16 Below Average    Forward 8 25 Average    Backward 10 50 Average    Sequencing 5 5 Well Below Average        Scaled Score Percentile   WAIS-IV Similarities: 11 63 Average       D-KEFS Color-Word Interference Test: Raw Score (Scaled Score) Percentile     Color Naming 36 secs. (10) 50 Average    Word Reading 23 secs. (12) 75 Above Average    Inhibition 160 secs. (2) <1 Exceptionally Low      Total Errors 10 errors (5) 5 Well Below Average    Inhibition/Switching 180 secs. (1) <1 Exceptionally Low      Total Errors 22 errors (1) <1 Exceptionally Low       NAB Executive Functions Module, Form 1: T Score Percentile     Judgment 60 84 Above Average       Language:          Verbal Fluency Test: Raw  Score (Scaled Score) Percentile     Phonemic Fluency (CFL) 44 (13) 84 Above Average    Category Fluency 24 (6) 9 Below Average  *Based on [employer] Older Normative Studies (MOANS)          NAB Language Module, Form 1: T Score Percentile     Auditory Comprehension 39 14 Below Average    Naming 25/31 (32) 4 Well Below Average       Visuospatial/Visuoconstruction:      Raw Score Percentile   Clock Drawing: 7/10 --- Within Normal Limits       Mood and Personality:      Raw Score Percentile   Geriatric Depression Scale: 10 --- Mild  Geriatric Anxiety Scale: 10 --- Minimal    Somatic 5 --- Minimal    Cognitive 3 --- Mild    Affective 2 --- Minimal       Additional Questionnaires:      Raw Score Percentile   PROMIS Sleep Disturbance Questionnaire: 12 --- None to Slight   Informed Consent and Coding/Compliance:   Dr. Autumn [name] was provided with [name] verbal description of the nature and purpose of the present neuropsychological evaluation. Also reviewed were the foreseeable risks and/or discomforts and benefits of the procedure, limits of confidentiality, and mandatory reporting requirements of this provider. The patient was given the opportunity to ask questions and receive answers about the evaluation. Oral consent to participate was provided by the patient.   This evaluation was conducted by Newman NickelsZachary C. Tomeka Kantner, Ph.D., licensed clinical neuropsychologist. Dr. Autumn [name] completed [name] comprehensive clinical interview with Dr. Milbert CoulterMerz, billed as one unit [phone], and 130 minutes of cognitive testing and scoring, billed as one unit [phone] and three additional units [id number]. Psychometrist Shan LevansKim Shuttleworth, B.S., assisted Dr. Milbert CoulterMerz with test administration and scoring procedures. As [name] separate and discrete service, Dr. Milbert CoulterMerz spent [name] total of 160 minutes in interpretation and report writing billed as one unit [phone] and two units [id number].

## 2020-03-07 ENCOUNTER — Other Ambulatory Visit: Payer: Self-pay

## 2020-03-07 ENCOUNTER — Ambulatory Visit (INDEPENDENT_AMBULATORY_CARE_PROVIDER_SITE_OTHER): Payer: PPO | Admitting: Psychology

## 2020-03-07 ENCOUNTER — Telehealth: Payer: Self-pay

## 2020-03-07 DIAGNOSIS — F0390 Unspecified dementia without behavioral disturbance: Secondary | ICD-10-CM

## 2020-03-07 DIAGNOSIS — S069X9S Unspecified intracranial injury with loss of consciousness of unspecified duration, sequela: Secondary | ICD-10-CM

## 2020-03-07 DIAGNOSIS — I609 Nontraumatic subarachnoid hemorrhage, unspecified: Secondary | ICD-10-CM

## 2020-03-07 NOTE — Progress Notes (Signed)
   Neuropsychology Feedback Session [name]. [facility]  Reason for Referral:   [name] Studstill Collingsis a 87 y.o. right-handed Caucasian male referred by Jenna Luo, M.D.,to characterize hiscurrent cognitive functioning and assist with diagnostic clarity and treatment planning in the context of cognitive dysfunction stemming from several recent falls, as well as concerns for an underlying neurodegenerative condition.   Feedback:   [name] completed a comprehensive neuropsychological evaluation on 02/29/2020. Please refer to that encounter for the full report and recommendations. Briefly, results suggested prominent impairment surrounding all aspects of learning and memory. Additional impairments were seen across aspects of executive functioning (i.e., cognitive flexibility and response inhibition), while performance variability was exhibited across attention/concentration, expressive language, and visuospatial abilities. Regarding etiology, I have concerns surrounding late-onset Alzheimer's disease. Across memory measures, [name] was fully amnestic after a delay and exhibited very poor accuracy across yes/no recognition trials. This suggests evidence for a memory storage deficit, which is the hallmark characteristic of this condition. Impairment in executive functioning, a relative weakness in semantic fluency related to phonemic fluency, and variability across confrontation naming and visuospatial abilities are further consistent with this presentation.  [name] was accompanied by his wife during the current feedback session. Content of the current session focused on the results of his neuropsychological evaluation. [name] and his wife were given the opportunity to ask questions and their questions were answered. They were encouraged to reach out should additional questions arise. A copy of his report was provided at the conclusion  of the visit.      25 minutes were spent conducting the current feedback session with [name], billed as one unit [phone].

## 2020-03-07 NOTE — Patient Instructions (Signed)
A repeat neuropsychological evaluation in 12-18 months (or sooner if functional decline is noted) is recommended to assess the trajectory of future cognitive decline should it occur. This will also aid in future efforts towards improved diagnostic clarity.  [name] does not appear to be followed by a neurologist at this time. I would recommend that he meet with a neurologist to discuss treatment options. He would likely benefit from starting an acetylcholinesterase inhibitor such as donepezil/Aricept. This class of medication has been shown to slow functional decline in some individuals. However, it is important to highlight that no current treatment can stop or reverse decline in the face of a neurodegenerative illness. I will defer to his future neurologist regarding the necessity of continued PT/vestibular therapy and vision-related examinations.   [name]' wife noted that since he sustained orthopedic damage to one side of his body stemming from one of his falls, he has become a back sleeper. During this time, she reported that he will commonly be heard gasping for air like he is having trouble breathing while asleep. He also reported only obtaining 4-5 hours of broken sleep each night and fatigues easily during the day. He and his medical team could discuss a referral for a laboratory or at-home sleep study to assess for obstructive sleep apnea. If present and left untreated, this would could worsen cognitive dysfunction while also increasing his risk for heart attack and stroke.  Should there be a progression of his current deficits over time, [name] is unlikely to regain any independent living skills lost. Therefore, it is recommended that he remain as involved as possible in all aspects of household chores, finances, and medication management, with supervision to ensure adequate performance. He will likely benefit from the establishment and maintenance of a routine in order to  maximize his functional abilities over time.  It will be important for [name] to have another person with him when in situations where he may need to process information, weigh the pros and cons of different options, and make decisions, in order to ensure that he fully understands and recalls all information to be considered.  If not already done, [name] and his family may want to discuss his wishes regarding durable power of attorney and medical decision making, so that he can have input into these choices. Additionally, they may wish to discuss future plans for caretaking and seek out community options for in home/residential care should they become necessary.  A combination of medication and psychotherapy has been shown to be most effective at treating symptoms of anxiety and depression. As such, [name] is encouraged to speak with his prescribing physician regarding medication adjustments to optimally manage these symptoms. Likewise, [name] is encouraged to consider engaging in short-term psychotherapy to address symptoms of psychiatric distress. he would benefit from an active and collaborative therapeutic environment, rather than one purely supportive in nature. Recommended treatment modalities include Cognitive Behavioral Therapy (CBT) or Acceptance and Commitment Therapy (ACT).  Performance across neurocognitive testing is not a strong predictor of an individual's safety operating a motor vehicle. However, i do agree with prior physician recommendations and would advise [name] to abstain from driving pursuits, both for his safety ans well as the safety of others. Should his family wish to pursue a formalized driving evaluation, they would be encouraged to [employer] [facility] in [place], [place] at [phone].Another option would be through [facility]; however, the latter would likely require a referral from a  medical doctor. [facility] can  be reached directly at [phone].  Important information to remember should be presented in written format in all instances. This should be placed in a highly visible and commonly frequented area of his residence to try and help promote recall.   To address problems with fluctuating attention, he may wish to consider:   -Avoiding external distractions when needing to concentrate   -Limiting exposure to fast paced environments with multiple sensory demands   -Writing down complicated information and using checklists   -Attempting and completing one task at a time (i.e., no multi-tasking)   -Verbalizing aloud each step of a task to maintain focus   -Taking frequent breaks during the completion of steps/tasks to avoid fatigue   -Reducing the amount of information considered at one time   -Scheduling more difficult activities for a time of day where he is usually most alert

## 2020-03-08 ENCOUNTER — Telehealth (INDEPENDENT_AMBULATORY_CARE_PROVIDER_SITE_OTHER): Payer: PPO | Admitting: Family Medicine

## 2020-03-08 ENCOUNTER — Encounter: Payer: Self-pay | Admitting: Vascular Surgery

## 2020-03-08 ENCOUNTER — Ambulatory Visit: Payer: PPO | Admitting: Vascular Surgery

## 2020-03-08 DIAGNOSIS — R296 Repeated falls: Secondary | ICD-10-CM | POA: Diagnosis not present

## 2020-03-08 DIAGNOSIS — F0390 Unspecified dementia without behavioral disturbance: Secondary | ICD-10-CM

## 2020-03-08 DIAGNOSIS — R42 Dizziness and giddiness: Secondary | ICD-10-CM

## 2020-03-08 HISTORY — DX: Dizziness and giddiness: R42

## 2020-03-08 MED ORDER — DONEPEZIL HCL 5 MG PO TABS: 5.0000 mg | ORAL_TABLET | Freq: Every day | ORAL | 0 refills | Status: DC

## 2020-03-08 NOTE — Progress Notes (Signed)
Patient name: [name] MRN: [id number] DOB: [birth year] Sex: male  REASON FOR CONSULT: Evaluate findings on CTA head and neck from the ED with dizziness  HPI: [name] is a 87 y.o. male, with history of coronary artery disease status post remote PCI, hypertension, hyperlipidemia, possible Altheimer's dementia that presents for evaluation of recent CTA head neck in the ED for underlying dizziness.  He is here with his wife today and states that he has had about 5 or 6 mechanical falls at home since June of last year.  During his fourth or fifth fall ultimately he went to the emergency room and had further work-up including CTA head and neck.  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  He denies any history of TIA or stroke.  States his dizziness is usually worse when sitting up or when he bends over.    Past Medical History:  Diagnosis Date  . Acute blood loss anemia   . Acute upper respiratory infections of unspecified site 11/04/2012  . AKI (acute kidney injury)   . Benign prostatic hyperplasia   . CAD (coronary artery disease)    s/p cypher DES to pLAD 6/08; normal LVF;  ETT-Myoview 2009: no ischemia   . Clavicle fracture 08/10/2019  . Closed displaced fracture of phalanx of left thumb, sequela 08/10/2019  . Coronary atherosclerosis of native coronary artery 03/08/2008  . Eosinophilia   . Essential hypertension   . Fracture of multiple ribs with pain 08/10/2019  . History of multiple falls   . History of SAH (subarachnoid hemorrhage) 07/14/2019  . Hyperlipidemia type IIB / III 03/08/2008  . Major neurocognitive disorder due to possible  Alzheimer's disease, without behavioral disturbance 02/29/2020  . MI (myocardial infarction)   . 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 Nocturnal leg cramps 11/23/2012  . Orthostatic hypotension 03/08/2008  . Pain in joint, shoulder region 11/23/2012  . Stage 3b chronic kidney disease   . Thrombocytopenia   . Trigeminal neuralgia   . Type 2 diabetes mellitus     Past Surgical History:  Procedure Laterality Date  . APPENDECTOMY    . CARDIAC CATHETERIZATION  07/30/2006   CORONARY ANGIOPLASTY WITH STENT PLACEMENT  . CARDIAC CATHETERIZATION    . EXPLORATORY LAPAROTOMY     age 72  . EXPLORATORY LAPAROTOMY    . IR THORACENTESIS ASP PLEURAL SPACE W/IMG GUIDE  10/05/2019    Family History  Problem Relation Age of Onset  . Stomach cancer Mother   . Memory loss Mother   . Heart disease Father   . Coronary artery disease Father 17  . Heart attack Father   . Heart disease Brother   . Aortic aneurysm Brother   . Colon cancer Brother   . Aortic aneurysm Brother   . Colon cancer Brother   . Arthritis Daughter        rheumatoid  . Coronary artery disease Brother   . Esophageal cancer Neg Hx   . Rectal cancer Neg Hx     SOCIAL HISTORY: Social History   Socioeconomic History  . Marital status: Married    Spouse name: [name]  .  Number of children: 1  . Years of education: 88  . Highest education level: Doctorate  Occupational History  . Occupation: retired professor    Comment: History   . Occupation: missionary  Tobacco Use  . Smoking status: Never Smoker  . Smokeless tobacco: Never Used  Substance and Sexual Activity  . Alcohol use: Not Currently  . Drug use: Never  . Sexual activity: Yes    Partners: Female  Other Topics Concern  . Not on file  Social History Narrative   ** Merged History Encounter **       Lives with his wife (second marriage in 2015, first marriage ended when his wife died alzheimer's  disease). Since his remarriage, his adult daughter doesn't speak with him.   Social Determinants of Health   Financial Resource Strain: Not on file  Food Insecurity: Not on file  Transportation Needs: Not on file  Physical Activity: Not on file  Stress: Not on file  Social Connections: Not on file  Intimate Partner Violence: Not on file    Allergies  Allergen Reactions  . Gabapentin Other (See Comments)    Visual changes and confusion- "I went blind while I was driving"    Current Outpatient Medications  Medication Sig Dispense Refill  . albuterol (PROAIR HFA) 108 (90 Base) MCG/ACT inhaler INHALE 2 PUFFS INTO THE LUNGS EVERY 6 HOURS AS NEEDED FOR WHEEZING OR SHORTNESS OF BREATH (Patient taking differently: Inhale 2 puffs into the lungs every 6 (six) hours as needed for wheezing or shortness of breath.) 6.7 g 0  . aspirin EC 81 MG tablet Take 81 mg by mouth daily. Swallow whole.    Marland Kitchen BREO ELLIPTA 200-25 MCG/INH AEPB INHALE 1 PUFF BY MOUTH EVERY DAY (Patient taking differently: Inhale 1 puff into the lungs daily.) 60 each 1  . Calcium Carb-Cholecalciferol (CALCIUM 600+D3 PO) Take 1 tablet by mouth 2 (two) times daily.    Marland Kitchen doxazosin (CARDURA) 2 MG tablet TAKE 1 TABLET BY MOUTH EVERY DAY (Patient taking differently: Take 2 mg by mouth daily.) 90 tablet 3  . escitalopram (LEXAPRO) 10 MG tablet TAKE 1 TABLET BY MOUTH EVERY DAY (Patient taking differently: Take 10 mg by mouth daily.) 90 tablet 2  . ezetimibe (ZETIA) 10 MG tablet TAKE 1 TABLET BY MOUTH EVERY DAY (Patient taking differently: Take 10 mg by mouth at bedtime.) 90 tablet 3  . loratadine (CLARITIN) 10 MG tablet Take 10 mg by mouth daily as needed for allergies or rhinitis.     . Multiple Vitamin (MULTIVITAMIN WITH MINERALS) TABS tablet Take 1 tablet by mouth daily.    Marland Kitchen amLODipine (NORVASC) 5 MG tablet Take 1 tablet (5 mg total) by mouth daily. 90 tablet 3  . Apoaequorin (PREVAGEN EXTRA STRENGTH PO) Take 1 tablet by mouth daily.     . diclofenac Sodium (VOLTAREN) 1 % GEL Apply 1 application topically daily as needed (pain). (Patient not taking: Reported on 03/08/2020)     No current facility-administered medications for this visit.    REVIEW OF SYSTEMS:  [X]  denotes positive finding, [ ]  denotes negative finding Cardiac  Comments:  Chest pain or chest pressure:    Shortness of breath upon exertion:    Short of breath when lying flat:    Irregular heart rhythm:        Vascular    Pain in calf, thigh, or hip brought on by ambulation:    Pain in feet at night that wakes you up from your sleep:  Blood clot in your veins:    Leg swelling:         Pulmonary    Oxygen at home:    Productive cough:     Wheezing:         Neurologic    Sudden weakness in arms or legs:     Sudden numbness in arms or legs:     Sudden onset of difficulty speaking or slurred speech:    Temporary loss of vision in one eye:     Problems with dizziness:         Gastrointestinal    Blood in stool:     Vomited blood:         Genitourinary    Burning when urinating:     Blood in urine:        Psychiatric    Major depression:         Hematologic    Bleeding problems:    Problems with blood clotting too easily:        Skin    Rashes or ulcers:        Constitutional    Fever or chills:      PHYSICAL EXAM: Vitals:   03/08/20 1338 03/08/20 1345  BP: 135/88 (!) 153/77  Pulse: 72 71  Resp: 16   Temp: 98.1 F (36.7 C)   TempSrc: Temporal   SpO2: 96%   Weight: 177 lb (80.3 kg)   Height: 5' 7.5" (1.715 m)     GENERAL: The patient is a well-nourished male, in no acute distress. The vital signs are documented above. CARDIAC: There is a regular rate and rhythm.  VASCULAR:  Palpable radial and femoral pulses bilaterally PULMONARY: No respiratory distress. ABDOMEN: Soft and non-tender. MUSCULOSKELETAL: There are no major deformities or cyanosis. NEUROLOGIC: No focal weakness or paresthesias are detected. SKIN: There  are no ulcers or rashes noted. PSYCHIATRIC: The patient has a normal affect.  DATA:   I independently reviewed his CTA head and neck and he has no significant carotid bifurcation disease to warrant intervention.  The left vert is occluded the right vert is dominant and is widely patent.  He does have a persistent right trigeminal artery.  Assessment/Plan:  I had a long discussion with the patient and his wife after reviewing a number of the CTA head and neck findings.  I discussed as a vascular surgeon I do not operate above the skull base and ultimately he does not have any carotid bifurcation disease that would warrant surgical intervention at this time.  Given his asymptomatic carotid disease discussed current guidelines are for surgery greater than 80%.  We discussed that this is primary for stroke risk reduction not for dizziness improvement.  His carotid disease is currently followed by his neurologist.  In addition he does have some variant anatomy of his vertebral artery with a right vertebral that is dominant and the left appears hypoplastic.  I do not see any stenosis in the right vertebral artery up to the vertebrobasilar junction.  I discussed based on everything below the skull base I do not see anything that would warrant intervention from my standpoint.  I would recommend neurology evaluation and he already has an appointment in April to further sort out his ongoing dizziness in addition to the question of Alzheimer's dementia recently diagnosed by a neuropsychologist.  Ultimately I suggested that if there was concern for his basilar artery hypoplasia versus stenosis as an etiology for his dizziness would likely refer  him to Dr. Estanislado Pandy with neuro IR.  We discussed options of making that referral today but ultimately decided that we will allow him to see neurology first to get their thoughts and see if they feel it warrants any additional evaluation.    Marty Heck, MD [facility] Office: [phone]

## 2020-03-08 NOTE — Progress Notes (Signed)
Subjective:    Patient ID: [name], male    DOB: 12-05-1933, 87 y.o.   MRN: [id number]  HPI  Patient is being seen today as a telephone visit.  He consents to be seen via telephone.  Phone call began at 4:00.  Phone call concluded around 420.  The patient is currently at home.  I am currently in my office.  His wife was present on the telephone as well.  Please see my last office visit for background on the situation however the patient suffered a subarachnoid hemorrhage due to traumatic brain injury from a fall.  Since that time he has had progressive dizziness, frequent falls, as well as worsening memory loss.  I referred the patient for cognitive testing through neurology and the patient shows signs of Alzheimer's disease unfortunately.  He is here today during the visit to discuss treatment options.  He also reports hypersomnolence.  His wife reports witnessed apneic episodes at night.  Therefore they are also concerned about possible obstructive sleep apnea contributing to his memory loss due to hypoxia.  They would like a referral for home sleep study.  Also they have not been contacted by physical therapy regarding vestibular rehab due to the frequent falls. Past Medical History:  Diagnosis Date  . Acute blood loss anemia   . Acute upper respiratory infections of unspecified site 11/04/2012  . AKI (acute kidney injury)   . Benign prostatic hyperplasia   . CAD (coronary artery disease)    s/p cypher DES to pLAD 6/08; normal LVF;  ETT-Myoview 2009: no ischemia   . Clavicle fracture 08/10/2019  . Closed displaced fracture of phalanx of left thumb, sequela 08/10/2019  . Coronary atherosclerosis of native coronary artery 03/08/2008  . Eosinophilia   . Essential hypertension   . Fracture of multiple ribs with pain 08/10/2019  . History of multiple falls   . History of SAH (subarachnoid hemorrhage) 07/14/2019  . Hyperlipidemia type IIB / III 03/08/2008  . Major neurocognitive  disorder due to possible Alzheimer's disease, without behavioral disturbance 02/29/2020  . MI (myocardial infarction)   . 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 Nocturnal leg cramps 11/23/2012  . Orthostatic hypotension 03/08/2008  . Pain in joint, shoulder region 11/23/2012  . Stage 3b chronic kidney disease   . Thrombocytopenia   . Trigeminal neuralgia   . Type 2 diabetes mellitus    Past Surgical History:  Procedure Laterality Date  . APPENDECTOMY    . CARDIAC CATHETERIZATION  07/30/2006   CORONARY ANGIOPLASTY WITH STENT PLACEMENT  . CARDIAC CATHETERIZATION    . EXPLORATORY LAPAROTOMY     age 52  . EXPLORATORY LAPAROTOMY    . IR THORACENTESIS ASP PLEURAL SPACE W/IMG GUIDE  10/05/2019   Current Outpatient Medications on File Prior to Visit  Medication Sig Dispense Refill  . albuterol (PROAIR HFA) 108 (90 Base) MCG/ACT inhaler INHALE 2 PUFFS INTO THE LUNGS EVERY 6 HOURS AS NEEDED FOR WHEEZING OR SHORTNESS OF BREATH (Patient taking differently: Inhale 2 puffs into the lungs every 6 (six) hours as needed for wheezing or shortness of breath.) 6.7 g 0  . amLODipine (NORVASC) 5 MG tablet Take 1 tablet (5 mg total) by mouth daily. 90 tablet 3  . Apoaequorin (PREVAGEN EXTRA STRENGTH PO) Take 1 tablet by mouth daily.    Marland Kitchen BREO ELLIPTA 200-25 MCG/INH AEPB INHALE 1 PUFF BY MOUTH EVERY DAY (Patient taking differently: Inhale 1 puff  into the lungs daily.) 60 each 1  . Calcium Carb-Cholecalciferol (CALCIUM 600+D3 PO) Take 1 tablet by mouth 2 (two) times daily.    . diclofenac Sodium (VOLTAREN) 1 % GEL Apply 1 application topically daily as needed (pain). (Patient not taking: Reported on 03/08/2020)    . doxazosin (CARDURA) 2 MG tablet TAKE 1 TABLET BY MOUTH EVERY DAY (Patient taking differently: Take 2 mg by mouth daily.) 90 tablet 3  . escitalopram (LEXAPRO) 10 MG tablet TAKE 1 TABLET BY MOUTH EVERY DAY (Patient taking differently: Take 10 mg  by mouth daily.) 90 tablet 2  . ezetimibe (ZETIA) 10 MG tablet TAKE 1 TABLET BY MOUTH EVERY DAY (Patient taking differently: Take 10 mg by mouth at bedtime.) 90 tablet 3  . loratadine (CLARITIN) 10 MG tablet Take 10 mg by mouth daily as needed for allergies or rhinitis.     . Multiple Vitamin (MULTIVITAMIN WITH MINERALS) TABS tablet Take 1 tablet by mouth daily.    Marland Kitchen aspirin EC 81 MG tablet Take 81 mg by mouth daily. Swallow whole.     No current facility-administered medications on file prior to visit.   Allergies  Allergen Reactions  . Gabapentin Other (See Comments)    Visual changes and confusion- "I went blind while I was driving"   Social History   Socioeconomic History  . Marital status: Married    Spouse name: [name]  . Number of children: 1  . Years of education: 63  . Highest education level: Doctorate  Occupational History  . Occupation: retired professor    Comment: History   . Occupation: missionary  Tobacco Use  . Smoking status: Never Smoker  . Smokeless tobacco: Never Used  Substance and Sexual Activity  . Alcohol use: Not Currently  . Drug use: Never  . Sexual activity: Yes    Partners: Female  Other Topics Concern  . Not on file  Social History Narrative   ** Merged History Encounter **       Lives with his wife (second marriage in 2015, first marriage ended when his wife died alzheimer's disease). Since his remarriage, his adult daughter doesn't speak with him.   Social Determinants of Health   Financial Resource Strain: Not on file  Food Insecurity: Not on file  Transportation Needs: Not on file  Physical Activity: Not on file  Stress: Not on file  Social Connections: Not on file  Intimate Partner Violence: Not on file     Review of Systems  All other systems reviewed and are negative.      Objective:   Physical Exam        Assessment & Plan:  Frequent falls - Plan: CANCELED: Ambulatory referral to Physical Therapy  Major  neurocognitive disorder due to possible Alzheimer's disease, without behavioral disturbance  After discussing with the patient and his wife we have agreed to try Aricept 5 mg a day and increase to 10 mg a day in 1 month if tolerated.  I will place a referral for a home sleep study to evaluate for the presence of obstructive sleep apnea.  I will also reconsult physical therapy for vestibular rehab due to the frequent falls

## 2020-03-09 ENCOUNTER — Telehealth: Payer: Self-pay | Admitting: *Deleted

## 2020-03-09 NOTE — Telephone Encounter (Signed)
-----   Message from [name], MD sent at 03/08/2020  4:54 PM EST ----- I have placed an order for a home sleep study.  I would like to try to have this arranged through [facility].  Can you please assist in ordering this

## 2020-03-09 NOTE — Telephone Encounter (Signed)
Message sent to [facility].  

## 2020-03-10 ENCOUNTER — Encounter: Payer: Self-pay | Admitting: Family Medicine

## 2020-03-10 ENCOUNTER — Encounter: Payer: Self-pay | Admitting: Neurology

## 2020-03-10 ENCOUNTER — Ambulatory Visit (INDEPENDENT_AMBULATORY_CARE_PROVIDER_SITE_OTHER): Payer: PPO | Admitting: Family Medicine

## 2020-03-10 ENCOUNTER — Ambulatory Visit: Payer: PPO | Admitting: Neurology

## 2020-03-10 ENCOUNTER — Other Ambulatory Visit: Payer: Self-pay

## 2020-03-10 VITALS — BP 140/70 | HR 79 | Temp 97.8°F | Ht 67.5 in | Wt 168.0 lb

## 2020-03-10 VITALS — BP 128/67 | HR 80 | Ht 67.5 in | Wt 176.0 lb

## 2020-03-10 DIAGNOSIS — R253 Fasciculation: Secondary | ICD-10-CM | POA: Diagnosis not present

## 2020-03-10 DIAGNOSIS — R42 Dizziness and giddiness: Secondary | ICD-10-CM | POA: Diagnosis not present

## 2020-03-10 DIAGNOSIS — M542 Cervicalgia: Secondary | ICD-10-CM

## 2020-03-10 DIAGNOSIS — F0390 Unspecified dementia without behavioral disturbance: Secondary | ICD-10-CM | POA: Diagnosis not present

## 2020-03-10 DIAGNOSIS — L0292 Furuncle, unspecified: Secondary | ICD-10-CM

## 2020-03-10 DIAGNOSIS — R292 Abnormal reflex: Secondary | ICD-10-CM | POA: Diagnosis not present

## 2020-03-10 MED ORDER — DOXYCYCLINE HYCLATE 100 MG PO TABS
100.0000 mg | ORAL_TABLET | Freq: Two times a day (BID) | ORAL | 0 refills | Status: DC
Start: 2020-03-10 — End: 2020-06-30

## 2020-03-10 NOTE — Progress Notes (Signed)
NEUROLOGY CONSULTATION NOTE  [name] MRN: [id number] DOB: 05/19/1933  Referring provider: Dr. Hazle Coca Primary care provider: Dr. Jenna Luo  Reason for consult:  Major Neurocognitive Disorder  Dear Dr Melvyn Novas:  Thank you for your kind referral of [name] for consultation of the above symptoms. Although his history is well known to you, please allow me to reiterate it for the purpose of our medical record. The patient was accompanied to the clinic by his wife [name] who also provides collateral information. Records and images were personally reviewed where available.   HISTORY OF PRESENT ILLNESS: This is a pleasant 87 year old right-handed man with a history of hypertension, CAD, diabetes, hyperlipidemia, presenting for management of Alzheimer's disease. He underwent Neuropsychological testing in last month with findings concerning for AD. Records were reviewed. He had an unwitnessed fall in June 2021 and sustained several fractures and a subarachnoid hemorrhage on the left that did not require surgical intervention. He started reporting forgetfulness at that time. He was discharged home but back in the hospital a month later with headache, low back pain and fever due to UTI. Head CT showed a left hemispheric subdural hemorrhage, new from prior CT, resolved on follow-up CT in September 2021. He had another fall in November 2021 when he missed a step. He was back in the ER a few weeks later due to increasing confusion and forgetfulness, frequent right-sided headaches, and a clicking noise in his neck, attributed to his recent fall. He was back in the ER again in January 2022 for dizziness, worse when he moves quickly or gets up too quickly, leading to falls. 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 Vestibular therapy helped with dizziness.  Neuropsychological evaluation indicated prominent impairement surrounding all aspects of learning and memory, meeting criteria for Major Neurocognitive Disorder, mild end of spectrum, etiology concerning for Alzheimer's disease. There may be a vascular component as well. Prior TBI exacerbated deficits that were already present rather than being primarily responsible for dysfunction.   They report his long-term memory is good, he can remember long passages from [place]. [name] reports some memory issues prior to the fall, for instance he would recall a story differently or he could not keep an appointment date in his mind or a prior decision they had made. He has not been driving. He manages his own pillbox with a good system, [name] checks behind him and notes he makes mistakes sometimes. He denies any difficulties managing finances, [name] has been managing them since the fall in June and notes there was a notification from the bank prior to his fall. He is fairly independent, [place] stays close during dressing and bathing mostly for safety purposes. He gets dizzy the most when bending down. He uses a cane all the time. He states mood is not bad, "I'm not miserable." A few times he would ask their daughter where her mother or when [name] is coming back when she is right there. He thinks the daughter who lives with them is a boy, using male pronouns for her, which is new. There have been times he thinks there is a second cat or the cat is a dog, mostly at night. He used to think they had 2 bedrooms.   He denies any headaches. He has occasional vertigo and feels like he would fall, last fall was in January. Dizziness mostly  occurs when he gets up, but he has also reported it while sitting or supine. It lasts at most 2 minutes, no nausea/vomiting, vision changes. The other night he woke up and saw nothing except for 2 black rings that he described as full black moons, this has happened twice. He has left trigeminal  neuralgia. He was having orthostatic hypotension initially after the fall and they had to stop PT. BP has improved but he still has occasional dizziness. No focal numbness/tingling/weakness. He has neck pain and notes a "clicking" in his neck. Cervical xray showed cervical spondylosis, trace grade 1 anterolisthesis at C2-3, C3-4, C5-6, and C6-7. He has low back pain. No bowel/bladder dysfunction. No anosmia but sometimes smells food when there is none, usually at night. He has mild hand tremors. He has occasional daytime drowsiness, sleeping on his back due to pain, however [name] notes he sounds like he is gasping for breath. He snores sometimes.    PAST MEDICAL HISTORY: Past Medical History:  Diagnosis Date  . Acute blood loss anemia   . Acute upper respiratory infections of unspecified site 11/04/2012  . AKI (acute kidney injury)   . Benign prostatic hyperplasia   . CAD (coronary artery disease)    s/p cypher DES to pLAD 6/08; normal LVF;  ETT-Myoview 2009: no ischemia   . Clavicle fracture 08/10/2019  . Closed displaced fracture of phalanx of left thumb, sequela 08/10/2019  . Coronary atherosclerosis of native coronary artery 03/08/2008  . Eosinophilia   . Essential hypertension   . Fracture of multiple ribs with pain 08/10/2019  . History of multiple falls   . History of SAH (subarachnoid hemorrhage) 07/14/2019  . Hyperlipidemia type IIB / III 03/08/2008  . Major neurocognitive disorder due to possible Alzheimer's disease, without behavioral disturbance 02/29/2020  . MI (myocardial infarction)   . 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 Nocturnal leg cramps 11/23/2012  . Orthostatic hypotension 03/08/2008  . Pain in joint, shoulder region 11/23/2012  . Stage 3b chronic kidney disease   . Thrombocytopenia   . Trigeminal neuralgia   . Type 2 diabetes mellitus     PAST SURGICAL HISTORY: Past Surgical History:  Procedure  Laterality Date  . APPENDECTOMY    . CARDIAC CATHETERIZATION  07/30/2006   CORONARY ANGIOPLASTY WITH STENT PLACEMENT  . CARDIAC CATHETERIZATION    . EXPLORATORY LAPAROTOMY     age 36  . EXPLORATORY LAPAROTOMY    . IR THORACENTESIS ASP PLEURAL SPACE W/IMG GUIDE  10/05/2019    MEDICATIONS: Current Outpatient Medications on File Prior to Visit  Medication Sig Dispense Refill  . albuterol (PROAIR HFA) 108 (90 Base) MCG/ACT inhaler INHALE 2 PUFFS INTO THE LUNGS EVERY 6 HOURS AS NEEDED FOR WHEEZING OR SHORTNESS OF BREATH (Patient taking differently: Inhale 2 puffs into the lungs every 6 (six) hours as needed for wheezing or shortness of breath.) 6.7 g 0  . amLODipine (NORVASC) 5 MG tablet Take 1 tablet (5 mg total) by mouth daily. 90 tablet 3  . Apoaequorin (PREVAGEN EXTRA STRENGTH PO) Take 1 tablet by mouth daily.    Marland Kitchen aspirin EC 81 MG tablet Take 81 mg by mouth daily. Swallow whole.    Marland Kitchen atorvastatin (LIPITOR) 40 MG tablet Take 40 mg by mouth daily.    Marland Kitchen BREO ELLIPTA 200-25 MCG/INH AEPB INHALE 1 PUFF BY MOUTH EVERY DAY (Patient taking differently: Inhale 1 puff into the lungs daily.) 60 each 1  .  Calcium Carb-Cholecalciferol (CALCIUM 600+D3 PO) Take 1 tablet by mouth 2 (two) times daily.    Marland Kitchen doxazosin (CARDURA) 2 MG tablet TAKE 1 TABLET BY MOUTH EVERY DAY (Patient taking differently: Take 2 mg by mouth daily.) 90 tablet 3  . escitalopram (LEXAPRO) 10 MG tablet TAKE 1 TABLET BY MOUTH EVERY DAY (Patient taking differently: Take 10 mg by mouth daily.) 90 tablet 2  . ezetimibe (ZETIA) 10 MG tablet TAKE 1 TABLET BY MOUTH EVERY DAY (Patient taking differently: Take 10 mg by mouth at bedtime.) 90 tablet 3  . loratadine (CLARITIN) 10 MG tablet Take 10 mg by mouth daily as needed for allergies or rhinitis.     . Multiple Vitamin (MULTIVITAMIN WITH MINERALS) TABS tablet Take 1 tablet by mouth daily.    . diclofenac Sodium (VOLTAREN) 1 % GEL Apply 1 application topically daily as needed (pain). (Patient  not taking: Reported on 03/10/2020)    . donepezil (ARICEPT) 5 MG tablet Take 1 tablet (5 mg total) by mouth at bedtime. (Patient not taking: Reported on 03/10/2020) 30 tablet 0   No current facility-administered medications on file prior to visit.    ALLERGIES: Allergies  Allergen Reactions  . Gabapentin Other (See Comments)    Visual changes and confusion- "I went blind while I was driving"    FAMILY HISTORY: Family History  Problem Relation Age of Onset  . Stomach cancer Mother   . Memory loss Mother   . Heart disease Father   . Coronary artery disease Father 11  . Heart attack Father   . Heart disease Brother   . Aortic aneurysm Brother   . Colon cancer Brother   . Aortic aneurysm Brother   . Colon cancer Brother   . Arthritis Daughter        rheumatoid  . Coronary artery disease Brother   . Esophageal cancer Neg Hx   . Rectal cancer Neg Hx     SOCIAL HISTORY: Social History   Socioeconomic History  . Marital status: Married    Spouse name: [name]  . Number of children: 1  . Years of education: 65  . Highest education level: Doctorate  Occupational History  . Occupation: retired professor    Comment: History   . Occupation: missionary  Tobacco Use  . Smoking status: Never Smoker  . Smokeless tobacco: Never Used  Vaping Use  . Vaping Use: Never used  Substance and Sexual Activity  . Alcohol use: Not Currently  . Drug use: Never  . Sexual activity: Yes    Partners: Female  Other Topics Concern  . Not on file  Social History Narrative   ** Merged History Encounter **       Lives with his wife (second marriage in 2015, first marriage ended when his wife died alzheimer's disease). Since his remarriage, his adult daughter doesn't speak with him.      Right handed   Social Determinants of Health   Financial Resource Strain: Not on file  Food Insecurity: Not on file  Transportation Needs: Not on file  Physical Activity: Not on file  Stress: Not on file   Social Connections: Not on file  Intimate Partner Violence: Not on file     PHYSICAL EXAM: Vitals:   03/10/20 0853  BP: 128/67  Pulse: 80  SpO2: 97%   General: No acute distress Head:  Normocephalic/atraumatic Skin/Extremities: No rash, no edema Neurological Exam: Mental status: alert and oriented to person, place, and time, no dysarthria or aphasia,  Fund of knowledge is appropriate.  Recent and remote memory are impaired.  Attention and concentration are normal.     Cranial nerves: CN I: not tested CN II: pupils equal, round and reactive to light, visual fields intact CN III, IV, VI:  full range of motion, no nystagmus, no ptosis CN V: facial sensation intact CN VII: upper and lower face symmetric CN VIII: hearing intact to conversation CN XI: sternocleidomastoid and trapezius muscles intact CN XII: tongue midline Bulk & Tone: normal, + fasciculations on left UE and LE Motor: 5/5 throughout with no pronator drift. Sensation: intact to light touch, cold, pin on both UE, decreased cold on right calf, left foot, decreased pin below right ankle. Decreased vibration sense to right knee, intact on left. Romberg test negative Deep Tendon Reflexes: brisk +3 on left UE and LE, +2 on right UE and LEno ankle clonus Plantar responses: downgoing bilaterally Cerebellar: no incoordination on finger to nose testing Gait: slow and cautious with cane, favoring right leg with decreased clearance of right foot Tremor: none   IMPRESSION: This is a pleasant 87 year old right-handed man with a history of hypertension, CAD, diabetes, hyperlipidemia, presenting for management of Alzheimer's disease. Neuropsychological evaluation results, diagnosis, prognosis discussed with patient and wife. Discussed starting Donepezil 5mg  daily, side effects and expectations discussed. He has not had brain imaging done, MRI brain with and without contrast will be ordered to assess for structural abnormality and  assess vascular load. He is also noted to have asymmetric brisk reflexes on the left with fasciculations. Cervical spine MRI without contrast and EMG/NCV of the left arm and leg will be ordered to further evaluate symptoms. Proceed with planned physical therapy and vestibular therapy. Dizziness may be cervicogenic as well. Continue close supervision. He does not drive. We discussed the importance of control of vascular risk factors, physical exercise, and brain stimulation exercises for brain health. Follow-up after tests, they know to call for any changes.   Thank you for allowing me to participate in the care of this patient. Please do not hesitate to call for any questions or concerns.   Ellouise Newer, M.D.  CC: Dr. Dennard Schaumann

## 2020-03-10 NOTE — Progress Notes (Signed)
Subjective:    Patient ID: [name], male    DOB: 1933/03/21, 87 y.o.   MRN: [id number]  HPI   08/29/19 Patient is a very pleasant 87 year old Caucasian male who presents today for hospital discharge follow-up.  Patient was walking up a set of steps at his home on June 8.  He has no recollection of the injury however his wife was home and heard him stumble.  He fell down 12 steps striking his head against the wall and the steps as he fell.  He was extremely confused and lost consciousness on the scene.  Wife witnessed convulsions.  Was taken directly to the emergency room.  Patient was found at that time to have a small subarachnoid hemorrhage.  He was also found to have 7 rib fractures as well as a left clavicular fracture and a broken thumb.  Patient went closed reduction and splinting of the broken thumb.  He was treated with nonweightbearing sling in his left upper extremity for the clavicular fracture.  This has since been mobilized to allow some range of motion and the patient is not wearing a sling today.  The rib fractures were allowed to heal spontaneously.  Patient was discharged from the hospital July 2 after being admitted to a rehab.  Patient presented back to the hospital with confusion and fever on July 13.  White blood cell count was found to be 15.  Patient was found to have a left-sided pleural effusion and consolidation.  He was started on doxycycline for presumed pneumonia.  Since that time he is afebrile.  He has not had his white blood cell count rechecked.  He has not had a repeat chest x-ray.  His wife states that his mental status has improved.  He is close to his baseline although at times he is still disoriented.  His balance has improved although he is still a high fall risk.  He stood today to allow me to examine him and he staggered and had to hold onto his walker in the table to maintain balance.  Patient had a repeat head CT on July 13 as well as July 15 which showed  a left-sided subdural hematoma.  Initial measurements were 8 mm on the 13th and 6 mm on the 15th.  Patient denies any headache.  He denies any confusion.  His wife states that his confusion has improved dramatically after he stopped prednisone and amitriptyline.  Per his report he was given amitriptyline to help him sleep.  He was given prednisone for "arthritis in his back".  The patient states that he was having significant pain in his lower back with aching and throbbing into his gluteus muscles bilaterally.  He had an x-ray of the lumbar spine during his hospitalization that showed mild degenerative changes but no acute fracture.  He denies any saddle anesthesia.  He denies any bowel or bladder incontinence.  He denies any leg weakness or leg numbness.  During his initial hospitalization he did have urinary retention and required a catheter and bethanechol.  However he was able to wean off this and is currently only on his doxazosin.  He is still holding his aspirin.  At that time, my plan was: My biggest concern is his recent fever, elevated white blood cell count, and left-sided pleural effusion.  He has been on doxycycline since the 15th.  He continues to report pain in his left chest that is getting worse.  However he believes this is due  to the rib fracture.  He denies any cough or hemoptysis or fever.  He does report pleurisy.  Therefore I would like to repeat the chest x-ray to ensure that the pleural effusion is not increasing and that there is not evidence of an empyema or worsening infection.  If the chest x-ray is stable, I plan to see the patient back next week for reevaluation.  Also repeat a CBC to monitor his leukocytosis.  Regarding his traumatic brain injury and subdural hematoma, patient is clinically stable having recently had a CAT scan.  I would repeat CT scan if there is clinical deterioration, worsening confusion, or ataxia.  Otherwise recheck the patient clinically next week.  Monitor  his chronic kidney disease with a BMP.  Rib fracture should heal spontaneously.  Thumb fracture and clavicular fracture being managed by orthopedics.  Continue to hold aspirin until balance has improved and subdural hematoma is resolving.  Hold beta-blocker at the present time as blood pressure is just now starting to increase.  Recheck blood pressure next week.  09/04/19 Chest x-ray showed no change.  White blood cell count had returned to normal.  Therefore there was no evidence of persistent pneumonia or worsening pleural effusion.  The patient continues to have pain over his ribs on his lower left side consistent with his previous rib fracture.  He does have some mild pleurisy but denies any shortness of breath or hemoptysis.  He denies any fever however he is having soaking night sweats every evening to the point that it soaks the sheets.  This concerns me.  He is also having hyperesthesias in his lower back roughly at the level of L4.  The skin in that area is extremely sensitive to the touch with no visible rash or abnormality.  He reports burning and stinging paresthesias in that area.  X-ray obtained in June showed degenerative disc disease but was otherwise negative for any acute injury.  I question if this may be related to some of his night sweats.  Patient continues to have issues with focus and concentration.  He is repeating questions that he just ask.  He is also having mood swings and is occasionally down and sad.  This is never been an issue for him before and I believe all are sequela of his traumatic brain injury.  He denies any hallucinations.  He is able to perform serial sevens today without any difficulty.  Short-term recall seems to be excellent.  At that time, my plan was: Biggest concern of the night sweats potentially related to an infection or malignancy.  Begin by rechecking his white blood cell count.  If normal, I would recommend an MRI of the lumbar spine given the worsening  paresthesias and hyperesthesias he is experiencing around the lower lumbar spine on the left.  Consider work-up for malignancy.  I believe the pleural effusion is stable.  Continue to hold aspirin as his balance is still poor.  Continue to hold Lotrel as his blood pressure remains well controlled.  Discontinue Breo as I do not believe the patient requires this medication  01/15/20 I have not seen the patient since July.  However in the interval time, he is fallen 2 additional times.  He struck his head once in November and suffered a concussion.  CT scan of the head at that time showed no intracranial bleed.  He also fell again in December also striking his head.  He presents today at my request for follow-up.  I  had noticed on recent lab work that his liver function tests were elevated.  Last imaging of the abdomen was in July and at that time there was a 15 mm lesion in the pancreas that has not been fully worked up.  No other imaging has been performed.  He is now off his statin.  In addition to his elevated liver function test he also reports hyperesthesias in the right side of his neck.  He screams and winces simply to have me gently touch the skin on the right side of his neck over the trapezius muscle.  There is no tenderness to palpation over the spinous processes of the cervical spine.  Flexion and extension does not elicit pain however rightward rotation elicits pain.  Pain is out of proportion to exam.  He also appears to have increasing confusion and balance issues.  I performed a Mini-Mental status exam today.  He is only able to remember 2 out of 3 objects on recall.  He is only able to perform 2 out of 5 calculations on serial sevens.  He has a difficult time telling me the date as he gets confused on the year.  He is able to spell world in reverse.  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  At that time, my plan  was: Hyperesthesias suggest neuropathic cause of the neck pain.  Obtain x-ray of the cervical spine to ensure that the patient has not suffered any neck injury with his recent falls.  I suspect nerve impingement.  Begin Lyrica 50 mg p.o. 3 times daily as needed nerve pain.  Recheck the patient next week to ensure that this is not exacerbating his memory loss or confusion.  I am concerned about his elevated LFTs particular given the lesion that was initially seen in his pancreas.  Therefore I will also order an US of the abdomen and pelvis to reassess the lesion in the pancreas and also to look at the liver to see if there is an explanation for the elevated liver function test.  I am concerned that his memory loss may be due to concussions also I am concerned about possible underlying dementia.  Reassess the patient next week to see if the Lyrica is helping with the pain and to go over the results of the [place]  01/19/20 Minimal grade 1 anterolisthesis is noted at C5-6 secondary to posterior facet joint hypertrophy. No definite fracture is noted. Degenerative changes seen involving posterior facet joints bilaterally at multiple levels. Mild degenerative disc disease is noted at C5-6 and C6-7.  Thankfully, the patient states that the pain on the right side of his neck is much better on the Lyrica. However his wife is accompanying him today and based on her facial expression I have a feeling that the patient is not being entirely accurate. I am concerned that his memory loss and confusion may be affecting his short-term memory. She thinks that the medicine is helping some. He is no longer screaming in pain. However she states that the medication also appears to be making him more confused may be making him less steady on his feet. He fell an additional time over the weekend. This would make the third time in the last few months that he is fallen. Today he has a difficult time remembering our conversation from  Friday. He also seems to be confused about the medication that  he is taking. This is definitely not the patient's baseline. 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 I believe that there may be an underlying dementia given the progressive decline since the summer although he has had several concussions in that exact same timeframe. He has an appointment Thursday to get an ultrasound of his liver to reevaluate due to the elevated liver function test. Both he and his wife are adamant that he had an MRI of the lumbar spine however we called the radiology department and they have no record of him coming. They state that he canceled his appointment. They state that no MRI was performed and therefore there is no read of the MRI to review.  At that time, my plan was: Regarding his neck pain, the Lyrica seems to have helped dramatically. I recommended that they use that medication sparingly to avoid confusion and sedation however I recommended that we simply monitor the neck pain for now. I do believe it is neuropathic in nature. X-ray shows no acute abnormality. Therefore I recommended at least 2 to 3 weeks to see if the pain will gradually improve.  Regarding his elevated LFTs, this is a mild elevation. He has been off the Lipitor and Tylenol now for more than a week so I'll repeat the CMP to see if it is trending down. Also obtain a right upper quadrant ultrasound. I want to rule out any evidence of spread and the mass that was seen in the pancreas.  Regarding his low back pain, the MRI apparently was not done. There is confusion in the radiology department. Therefore I will have my office manager contact their office manager and see if we can determine what actually happened as the patient and his wife are adamant they had the MRI.  Regarding his memory loss, I cannot tell if this is dementia this progressing or traumatic brain injury. However his  memory loss seems to be worsening and he seems to be becoming more confused and occasionally delirious. Therefore I would like to get a neurology consultation. At this point, the patient has progressive memory loss, occasional delirium, increasing falls. Therefore I would appreciate their input regarding any evidence of a neurodegenerative condition. He just had a CT scan that ruled out normal pressure hydrocephalus.  03/10/20 Has recently started aricept 5 mg poqday for Alzheimer's disease after completing comprehensive neuropsychological evaluation through neurology.  Unfortunately, the patient developed a boil in his left axilla.  Is roughly 2.5 cm in diameter.  Is erythematous, hot to the touch, and extremely tender.  He noticed it approximately 2 days ago.  Past Medical History:  Diagnosis Date  . Acute blood loss anemia   . Acute upper respiratory infections of unspecified site 11/04/2012  . AKI (acute kidney injury)   . Benign prostatic hyperplasia   . CAD (coronary artery disease)    s/p cypher DES to pLAD 6/08; normal LVF;  ETT-Myoview 2009: no ischemia   . Clavicle fracture 08/10/2019  . Closed displaced fracture of phalanx of left thumb, sequela 08/10/2019  . Coronary atherosclerosis of native coronary artery 03/08/2008  . Eosinophilia   . Essential hypertension   . Fracture of multiple ribs with pain 08/10/2019  . History of multiple falls   . History of SAH (subarachnoid hemorrhage) 07/14/2019  . Hyperlipidemia type IIB / III 03/08/2008  . Major neurocognitive disorder due to possible Alzheimer's disease, without behavioral disturbance 02/29/2020  . MI (myocardial infarction)   .  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 Nocturnal leg cramps 11/23/2012  . Orthostatic hypotension 03/08/2008  . Pain in joint, shoulder region 11/23/2012  . Stage 3b chronic kidney disease   . Thrombocytopenia   . Trigeminal neuralgia   . Type  2 diabetes mellitus    Past Surgical History:  Procedure Laterality Date  . APPENDECTOMY    . CARDIAC CATHETERIZATION  07/30/2006   CORONARY ANGIOPLASTY WITH STENT PLACEMENT  . CARDIAC CATHETERIZATION    . EXPLORATORY LAPAROTOMY     age 32  . EXPLORATORY LAPAROTOMY    . IR THORACENTESIS ASP PLEURAL SPACE W/IMG GUIDE  10/05/2019   Current Outpatient Medications on File Prior to Visit  Medication Sig Dispense Refill  . albuterol (PROAIR HFA) 108 (90 Base) MCG/ACT inhaler INHALE 2 PUFFS INTO THE LUNGS EVERY 6 HOURS AS NEEDED FOR WHEEZING OR SHORTNESS OF BREATH (Patient taking differently: Inhale 2 puffs into the lungs every 6 (six) hours as needed for wheezing or shortness of breath.) 6.7 g 0  . amLODipine (NORVASC) 5 MG tablet Take 1 tablet (5 mg total) by mouth daily. 90 tablet 3  . Apoaequorin (PREVAGEN EXTRA STRENGTH PO) Take 1 tablet by mouth daily.    Marland Kitchen aspirin EC 81 MG tablet Take 81 mg by mouth daily. Swallow whole.    Marland Kitchen BREO ELLIPTA 200-25 MCG/INH AEPB INHALE 1 PUFF BY MOUTH EVERY DAY (Patient taking differently: Inhale 1 puff into the lungs daily.) 60 each 1  . Calcium Carb-Cholecalciferol (CALCIUM 600+D3 PO) Take 1 tablet by mouth 2 (two) times daily.    . diclofenac Sodium (VOLTAREN) 1 % GEL Apply 1 application topically daily as needed (pain). (Patient not taking: Reported on 03/10/2020)    . donepezil (ARICEPT) 5 MG tablet Take 1 tablet (5 mg total) by mouth at bedtime. (Patient not taking: Reported on 03/10/2020) 30 tablet 0  . doxazosin (CARDURA) 2 MG tablet TAKE 1 TABLET BY MOUTH EVERY DAY (Patient taking differently: Take 2 mg by mouth daily.) 90 tablet 3  . escitalopram (LEXAPRO) 10 MG tablet TAKE 1 TABLET BY MOUTH EVERY DAY (Patient taking differently: Take 10 mg by mouth daily.) 90 tablet 2  . ezetimibe (ZETIA) 10 MG tablet TAKE 1 TABLET BY MOUTH EVERY DAY (Patient taking differently: Take 10 mg by mouth at bedtime.) 90 tablet 3  . loratadine (CLARITIN) 10 MG tablet Take 10 mg  by mouth daily as needed for allergies or rhinitis.     . Multiple Vitamin (MULTIVITAMIN WITH MINERALS) TABS tablet Take 1 tablet by mouth daily.     No current facility-administered medications on file prior to visit.   Allergies  Allergen Reactions  . Gabapentin Other (See Comments)    Visual changes and confusion- "I went blind while I was driving"   Social History   Socioeconomic History  . Marital status: Married    Spouse name: [name]  . Number of children: 1  . Years of education: 3  . Highest education level: Doctorate  Occupational History  . Occupation: retired professor    Comment: History   . Occupation: missionary  Tobacco Use  . Smoking status: Never Smoker  . Smokeless tobacco: Never Used  Vaping Use  . Vaping Use: Never used  Substance and Sexual Activity  . Alcohol use: Not Currently  . Drug use: Never  . Sexual activity: Yes    Partners: Female  Other Topics Concern  . Not on file  Social History Narrative   ** Merged History Encounter **       Lives with his wife (second marriage in 2015, first marriage ended when his wife died alzheimer's disease). Since his remarriage, his adult daughter doesn't speak with him.      Right handed   Social Determinants of Health   Financial Resource Strain: Not on file  Food Insecurity: Not on file  Transportation Needs: Not on file  Physical Activity: Not on file  Stress: Not on file  Social Connections: Not on file  Intimate Partner Violence: Not on file   Family History  Problem Relation Age of Onset  . Stomach cancer Mother   . Memory loss Mother   . Heart disease Father   . Coronary artery disease Father 52  . Heart attack Father   . Heart disease Brother   . Aortic aneurysm Brother   . Colon cancer Brother   . Aortic aneurysm Brother   . Colon cancer Brother   . Arthritis Daughter        rheumatoid  . Coronary artery disease Brother   . Esophageal cancer Neg Hx   . Rectal cancer Neg Hx       Review of Systems  All other systems reviewed and are negative.      Objective:   Physical Exam Vitals reviewed.  Constitutional:      General: He is not in acute distress.    Appearance: He is well-developed. He is not diaphoretic.  Neck:     Thyroid: No thyromegaly.     Vascular: No JVD.     Trachea: No tracheal deviation.  Cardiovascular:     Rate and Rhythm: Normal rate and regular rhythm.     Heart sounds: Normal heart sounds. No murmur heard. No friction rub. No gallop.   Pulmonary:     Effort: Pulmonary effort is normal. No accessory muscle usage, prolonged expiration or respiratory distress.     Breath sounds: No stridor. No wheezing or rales.  Musculoskeletal:        General: No tenderness.     Cervical back: Neck supple. No erythema, rigidity, torticollis or crepitus. No pain with movement, spinous process tenderness or muscular tenderness. Normal range of motion.  Lymphadenopathy:     Cervical: No cervical adenopathy.  Skin:    General: Skin is warm.     Coloration: Skin is not pale.     Findings: Erythema and lesion present. No rash.  Neurological:     Mental Status: He is alert.     Cranial Nerves: Cranial nerves are intact.     Motor: No abnormal muscle tone.     Deep Tendon Reflexes: Reflexes are normal and symmetric.           Assessment & Plan:  Boil  The area was anesthetized with 0.1% lidocaine without epinephrine.  A 1 cm vertical incision was made directly in the center of the abscess.  Purulent yellow and brown pus was expressed from the cavity.  The wound was then probed with a Q-tip soaked in hydrogen peroxide and then packed with approximately 1 inch of 1/4 inch iodoform gauze.  Begin doxycycline 100 mg p.o. twice daily for 7 days.

## 2020-03-10 NOTE — Patient Instructions (Signed)
1. Schedule MRI brain with and without contrast  2. Schedule MRI cervical spine without contrast  3. Schedule EMG/NCV of the left arm and leg  4. Start Donepezil 5mg  daily  5. Proceed with physical therapy and vestibular therapy

## 2020-03-23 ENCOUNTER — Other Ambulatory Visit: Payer: Self-pay

## 2020-03-23 ENCOUNTER — Ambulatory Visit: Payer: PPO | Admitting: Neurology

## 2020-03-23 DIAGNOSIS — M542 Cervicalgia: Secondary | ICD-10-CM | POA: Diagnosis not present

## 2020-03-23 DIAGNOSIS — R253 Fasciculation: Secondary | ICD-10-CM

## 2020-03-23 NOTE — Procedures (Signed)
[facility] Neurology  [address] [place]  [place], [place] [address] Tel: [phone] Fax:  [phone] Test Date:  03/23/2020  Patient: [name] DOB: 1934/01/23 Physician: Narda Amber, DO  Sex: Male Height: 5\' 7"  Ref Phys: Ellouise Newer, M.D.  ID#: [id number]   Technician:    Patient Complaints: This is an 87 year old man referred for evaluation of muscle fasciculations.  NCV & EMG Findings: Extensive electrodiagnostic testing of the left upper and lower extremity shows:  1. All sensory responses including the left median, ulnar, sural, and superficial peroneal nerves are within normal limits. 2. Left peroneal motor response is absent at the extensor digitorum brevis, and normal at the tibialis anterior; these findings are normal for patient's age.  Left median, ulnar, and tibial motor responses are within normal limits. 3. Left tibial H reflex study is within normal limits 4. There is no evidence of active or chronic motor axonal loss changes affecting any of the tested muscles.  Motor unit configuration and recruitment pattern is within normal limits.  Impression: This is a normal study of the left upper and lower extremities.  In particular, there is no evidence of a sensorimotor polyneuropathy, cervical/lumbosacral radiculopathy, or widespread disorder of anterior horn cells.   ___________________________ Narda Amber, DO    Nerve Conduction Studies Anti Sensory Summary Table   Stim Site NR Peak (ms) Norm Peak (ms) P-T Amp (V) Norm P-T Amp  Left Median Anti Sensory (2nd Digit)  34C  Wrist    3.7 <3.8 15.2 >10  Left Sup Peroneal Anti Sensory (Ant Lat Mall)  34C  12 cm    2.8 <4.6 6.9 >3  Left Sural Anti Sensory (Lat Mall)  34C  Calf    2.2 <4.6 5.3 >3  Left Ulnar Anti Sensory (5th Digit)  34C  Wrist    3.1 <3.2 19.6 >5   Motor Summary Table   Stim Site NR Onset (ms) Norm Onset (ms) O-P Amp (mV) Norm O-P Amp Site1 Site2 Delta-0 (ms) Dist (cm)  Vel (m/s) Norm Vel (m/s)  Left Median Motor (Abd Poll Brev)  34C  Wrist    3.3 <4.0 5.5 >5 Elbow Wrist 5.8 30.0 52 >50  Elbow    9.1  4.7         Left Peroneal Motor (Ext Dig Brev)  34C  Ankle NR  <6.0  >2.5 B Fib Ankle  0.0  >40  B Fib NR     Poplt B Fib  0.0  >40  Poplt NR            Left Peroneal TA Motor (Tib Ant)  34C  Fib Head    2.9 <4.5 4.1 >3 Poplit Fib Head 1.3 8.0 62 >40  Poplit    4.2  3.9         Left Tibial Motor (Abd Hall Brev)  34C  Ankle    3.6 <6.0 12.4 >4 Knee Ankle 9.3 43.0 46 >40  Knee    12.9  9.2         Left Ulnar Motor (Abd Dig Minimi)  34C  Wrist    2.6 <3.1 8.3 >7 B Elbow Wrist 3.9 22.0 56 >50  B Elbow    6.5  7.5  A Elbow B Elbow 1.8 10.0 56 >50  A Elbow    8.3  7.5          H Reflex Studies   NR H-Lat (ms) Lat Norm (ms) L-R H-Lat (ms)  Left  Tibial (Gastroc)  34C     31.02 <35    EMG   Side Muscle Ins Act Fibs Psw Fasc Number Recrt Dur Dur. Amp Amp. Poly Poly. Comment  Left AntTibialis Nml Nml Nml Nml Nml Nml Nml Nml Nml Nml Nml Nml N/A  Left Gastroc Nml Nml Nml Nml Nml Nml Nml Nml Nml Nml Nml Nml N/A  Left RectFemoris Nml Nml Nml Nml Nml Nml Nml Nml Nml Nml Nml Nml N/A  Left GluteusMed Nml Nml Nml Nml Nml Nml Nml Nml Nml Nml Nml Nml N/A  Left Flex Dig Long Nml Nml Nml Nml Nml Nml Nml Nml Nml Nml Nml Nml N/A  Left 1stDorInt Nml Nml Nml Nml Nml Nml Nml Nml Nml Nml Nml Nml N/A  Left PronatorTeres Nml Nml Nml Nml Nml Nml Nml Nml Nml Nml Nml Nml N/A  Left Biceps Nml Nml Nml Nml Nml Nml Nml Nml Nml Nml Nml Nml N/A  Left Triceps Nml Nml Nml Nml Nml Nml Nml Nml Nml Nml Nml Nml N/A  Left Deltoid Nml Nml Nml Nml Nml Nml Nml Nml Nml Nml Nml Nml N/A      Waveforms:

## 2020-03-24 ENCOUNTER — Telehealth: Payer: Self-pay

## 2020-03-24 NOTE — Telephone Encounter (Signed)
-----   Message from Cameron Sprang, MD sent at 03/24/2020 12:04 PM EST ----- Pls let him know the nerve and muscle test was normal, thanks

## 2020-03-24 NOTE — Telephone Encounter (Signed)
Spoke with pt wife [name] and informed her that pt nerve and muscle test was normal

## 2020-03-29 ENCOUNTER — Other Ambulatory Visit: Payer: PPO

## 2020-03-29 ENCOUNTER — Encounter: Payer: Self-pay | Admitting: Neurology

## 2020-03-31 ENCOUNTER — Other Ambulatory Visit: Payer: Self-pay | Admitting: Family Medicine

## 2020-04-05 NOTE — Telephone Encounter (Signed)
No action needed at this time, closing open encounter

## 2020-04-06 ENCOUNTER — Other Ambulatory Visit: Payer: Self-pay

## 2020-04-06 ENCOUNTER — Ambulatory Visit: Payer: PPO | Attending: Family Medicine

## 2020-04-06 DIAGNOSIS — R2689 Other abnormalities of gait and mobility: Secondary | ICD-10-CM | POA: Insufficient documentation

## 2020-04-06 DIAGNOSIS — M6281 Muscle weakness (generalized): Secondary | ICD-10-CM | POA: Insufficient documentation

## 2020-04-06 DIAGNOSIS — H8113 Benign paroxysmal vertigo, bilateral: Secondary | ICD-10-CM | POA: Insufficient documentation

## 2020-04-06 DIAGNOSIS — R262 Difficulty in walking, not elsewhere classified: Secondary | ICD-10-CM | POA: Insufficient documentation

## 2020-04-06 DIAGNOSIS — R2681 Unsteadiness on feet: Secondary | ICD-10-CM | POA: Diagnosis not present

## 2020-04-06 NOTE — Therapy (Signed)
[facility] [address] [place], [place], [address] Phone: [phone]   Fax:  [phone]   Encounter Date: 04/06/2020   PT End of Session - 04/06/20 1251    Visit Number 1    Number of Visits 13    Date for PT Re-Evaluation 06/01/20    PT Start Time 0930    PT Stop Time 6384    PT Time Calculation (min) 45 min    Equipment Utilized During Treatment Gait belt    Activity Tolerance Patient tolerated treatment well    Behavior During Therapy [facility] for tasks assessed/performed          Outpatient Physical Therapy Neuro Evaluation Patient Name: [name] MRN: [id number] DOB:[birth year], 87 y.o.,  male Today's Date: 04/06/2020  PCP: [name], MD Referring Provier: [name], MD   Past Medical History:  Diagnosis Date  . Acute blood loss anemia   . Acute upper respiratory infections of unspecified site 11/04/2012  . AKI (acute kidney injury)   . Benign prostatic hyperplasia   . CAD (coronary artery disease)    s/p cypher DES to pLAD 6/08; normal LVF;  ETT-Myoview 2009: no ischemia   . Clavicle fracture 08/10/2019  . Closed displaced fracture of phalanx of left thumb, sequela 08/10/2019  . Coronary atherosclerosis of native coronary artery 03/08/2008  . Eosinophilia   . Essential hypertension   . Fracture of multiple ribs with pain 08/10/2019  . History of multiple falls   . History of SAH (subarachnoid hemorrhage) 07/14/2019  . Hyperlipidemia type IIB / III 03/08/2008  . Major neurocognitive disorder due to possible Alzheimer's disease, without behavioral disturbance 02/29/2020  . MI (myocardial infarction)   . 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 Nocturnal leg cramps 11/23/2012  . Orthostatic hypotension 03/08/2008  . Pain in joint, shoulder region 11/23/2012  . Stage 3b chronic kidney disease   . Thrombocytopenia   . Trigeminal  neuralgia   . Type 2 diabetes mellitus    Past Surgical History:  Procedure Laterality Date  . APPENDECTOMY    . CARDIAC CATHETERIZATION  07/30/2006   CORONARY ANGIOPLASTY WITH STENT PLACEMENT  . CARDIAC CATHETERIZATION    . EXPLORATORY LAPAROTOMY     age 87  . EXPLORATORY LAPAROTOMY    . IR THORACENTESIS ASP PLEURAL SPACE W/IMG GUIDE  10/05/2019   Patient Active Problem List   Diagnosis Date Noted  . Dizziness 03/08/2020  . Major neurocognitive disorder due to possible Alzheimer's disease, without behavioral disturbance 02/29/2020  . CAD (coronary artery disease)   . MI (myocardial infarction)   . Trigeminal neuralgia   . 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 Multiple trauma   . Benign prostatic hyperplasia   . Essential hypertension   . Stage 3b chronic kidney disease ([place])   . Thrombocytopenia   . History of multiple falls 07/14/2019  . History of SAH (subarachnoid hemorrhage) 07/14/2019  . Type 2 diabetes mellitus   . Eosinophilia   . Nocturnal leg cramps 11/23/2012  . Pain in joint, shoulder region 11/23/2012  . Hematoma 11/04/2012  . Hyperlipidemia type IIB / III 03/08/2008  . Orthostatic hypotension 03/08/2008  . Coronary atherosclerosis of native coronary artery 03/08/2008    Referring Diagnosis: R29.6 (ICD-10-CM) - Frequent falls Medical Diagnosis: R26.89 Other abnormalities of gait and mobility; Unsteadiness of feet R26.81; difficulty in walking R26.2; H81.13 BPPV  SUBJECTIVE:  Patient history: Pt has hx of multiple falls. Last fall, patient fell down 12 steps and hit his head and fractured 7 ribs, Left clavicle and thumb and he also sustained small subarachnoid hemorrhage. He had 2 additional falls from last fall to 01/2020 and one of the fall he hit his head again and suffered concussion. Pt is recently experiencing more dizziness, imbalance and memory issues. Pt recently diagnosed with Alzheimer's disease. Pt reports that he notices  dizziness turning in bed or getting out of bed. Pt reports of spinning sensation. Pt usually lays in supine and rolls to R when getting out of bed (never rolls out of bed on L side)  Pain: is patient experiencing pain? Yes                VAS scale: 3/10              Pain location: neck              Pain orientation: Right               PAIN TYPE: aching              Pain description: intermittent               Aggravating factors: turning head              Relieving factors: rest  Precautions: Fall  Weight bearing restrictions: No  Falls: Has patient fallen in last 6 months? Yes, Number of falls: 5 (One fall in June, one in November where he fell bwd tripping over brick andhit his head on brick wall, one in December, and 2 more after that. Wife reports that most of the recent falls were where he got up too quickly and didn't wait for 5 sec before he moved forward.  Living Environment:  Lives with: places; lives with: Spouse and children  Lives in: House/apartment  Stairs: Yes; Internal: 12 steps; Rail on R going up and External: 5 steps; Rail on bil going up  Has following equipment at home: Quad cane large base  Prior level of function: Requires assistive device for independence, Needs assistance with ADLs and Needs assistance with homemaking  Patient Goals: to improve dizziness, reduce falls  OBJECTIVE:   Diagnostic findings:  02/15/20: CT Head and neck:  CTA HEAD AND NECK IMPRESSION:  1. Negative CTA for large vessel occlusion. 2. 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 Dominant right vertebral artery widely patent to the vertebrobasilar junction. 3. Persistent right trigeminal artery. 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 4. 1-2 mm focal outpouching arising from the cavernous right ICA at the takeoff of the  right trigeminal artery, suspicious for a small aneurysm. 5. 40% atheromatous stenoses about the carotid bifurcations/proximal ICAs bilaterally. 6. Large layering left pleural effusion, partially visualized.   Cognition:  Overall cognitive status: Impaired/different from baseline  Areas of impairment: Memory and Safety/judgement         Transfers:  Assistive device utilized: Quad cane large base   Bed mobility:  Supine to left side SBA Supine to right side SBA  Transfers:  Sit to stand: Complete Independence Stand to sit: Complete Independence Chair to chair: Complete Independence Floor: not assessed  Patient surveys:  FOTO 58.9  Today's Treatment:   Pt tested positive for R BPPV with Marye Round. Tested bil. Unable to put patient in  full testing position due to limited cervical ROM. Treated patient twice with Appley's maneuver for R BPPV. Pt reported c/o spinning sensation along with nausea and vomitting with both trials.    Patient Education:  Education details: Pt educated on sleeping in supine with 2 pillows. Pt educated on switching sides of bed so he rolls to L to get off the bed. Pt educated on no quick head turns in any directions. Pt educated on counting to 5 when changing positions from supine to sit, sit to stand, stand to walk. Person educated: Patient and Spouse Education method: Explanation Education comprehension: verbalized understanding   Home Exercise Program: none  Assessment: Clinical impression:  Patient is a 87 y.o. male who was seen today for gait and balance disorder. Objective impairments include Abnormal gait, decreased activity tolerance, decreased balance, decreased cognition, decreased endurance, decreased mobility, decreased safety awareness and postural dysfunction. Pt tested positive for R BPPV. These impairments are limiting patient from cleaning, community activity, driving, meal prep, laundry, medication management, yard work,  shopping, personal finances and yard work. Personal factors including Age, Past/current experiences, Time since onset of injury/illness/exacerbation and 1-2 comorbidities: DM, CAD, HTN, Alzheimer's disease are also affecting patient's functional outcome. Patient will benefit from skilled PT to address above impairments and improve overall function.  Rehab potential: Fair potentially due to cognitive deficits  Clinical decision making: Evolving/moderate complexity  Evaluation complexity: Moderate   Goals: Goals reviewed with patient? Yes  SHO STRT TERM GOALS: G Name Target Date Goal status  1 Pt will report <1 episodes of dizziness in a week to improve dizziness Comments: pt reports 1-2 dizziness episodes 05/04/20 INITIAL  2 Pt will demonstrate improved safety awareness by counting to 5 when changing positions to reduce fall frequency Comments:  05/04/20 INITIAL  LONG TERM GOALS:   LTG Name Target Date Goal status  1 Pt will report no falls or near falls in 3-4 consecutive weeks to reduce fall risk and improve safety Comments:  06/01/20 INITIAL  2 Pt will demo at least 5 points of change in Functional Gait Index to improve functional balance Comments: To be tested 06/01/20 INITIAL  3 Pt will report no symptoms of dizziness in 3 consecutive weeks to improve balance and safety with position changes. Comments: Eval: 1-2x/week 06/01/20 INITIAL   PLAN: PT frequency: 2x/week  PT duration: 8 weeks  Planned Interventions: Therapeutic exercises, Therapeutic activity, Neuro Muscular re-education, Balance training, Gait training, Patient/Family education, Joint mobilization, Stair training, Vestibular training, Canalith repositioning, Visual/preceptual remediation/compensation, Spinal mobilization and Traction   Plan for next session: Perform detailed Vestibular assessment, perform FGA and establish LTG#2  Kerrie Pleasure, PT 04/06/2020, 7:31 PM  [facility] [address] [place], [place], [address] Phone: [phone]   Fax:  [phone]  Patient name: BECK COFER MRN: [id number] DOB: [birth year]

## 2020-04-10 NOTE — Therapy (Addendum)
Outpatient Physical Therapy Neuro Vestibular Treatment Patient Name: [name] MRN: [id number] DOB:March 01, 1933, 87 y.o., male Today's Date: 04/11/2020  PCP: [name], MD Referring Provier: [name], MD     PT End of Session - 04/11/20 [phone]    Visit Number 2    Number of Visits 13    Date for PT Re-Evaluation 06/01/20    PT Start Time 0846    PT Stop Time 0930    PT Time Calculation (min) 44 min    Equipment Utilized During Treatment Gait belt    Activity Tolerance Patient tolerated treatment well    Behavior During Therapy [facility] for tasks assessed/performed           Past Medical History:  Diagnosis Date  . Acute blood loss anemia   . Acute upper respiratory infections of unspecified site 11/04/2012  . AKI (acute kidney injury)   . Benign prostatic hyperplasia   . CAD (coronary artery disease)    s/p cypher DES to pLAD 6/08; normal LVF;  ETT-Myoview 2009: no ischemia   . Clavicle fracture 08/10/2019  . Closed displaced fracture of phalanx of left thumb, sequela 08/10/2019  . Coronary atherosclerosis of native coronary artery 03/08/2008  . Eosinophilia   . Essential hypertension   . Fracture of multiple ribs with pain 08/10/2019  . History of multiple falls   . History of SAH (subarachnoid hemorrhage) 07/14/2019  . Hyperlipidemia type IIB / III 03/08/2008  . Major neurocognitive disorder due to possible Alzheimer's disease, without behavioral disturbance 02/29/2020  . MI (myocardial infarction)   . 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 Nocturnal leg cramps 11/23/2012  . Orthostatic hypotension 03/08/2008  . Pain in joint, shoulder region 11/23/2012  . Stage 3b chronic kidney disease   . Thrombocytopenia   . Trigeminal neuralgia   . Type 2 diabetes mellitus    Past Surgical History:  Procedure Laterality Date  . APPENDECTOMY    . CARDIAC CATHETERIZATION  07/30/2006   CORONARY  ANGIOPLASTY WITH STENT PLACEMENT  . CARDIAC CATHETERIZATION    . EXPLORATORY LAPAROTOMY     age 18  . EXPLORATORY LAPAROTOMY    . IR THORACENTESIS ASP PLEURAL SPACE W/IMG GUIDE  10/05/2019   Patient Active Problem List   Diagnosis Date Noted  . Dizziness 03/08/2020  . Major neurocognitive disorder due to possible Alzheimer's disease, without behavioral disturbance 02/29/2020  . CAD (coronary artery disease)   . MI (myocardial infarction)   . Trigeminal neuralgia   . 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 Multiple trauma   . Benign prostatic hyperplasia   . Essential hypertension   . Stage 3b chronic kidney disease ([place])   . Thrombocytopenia   . History of multiple falls 07/14/2019  . History of SAH (subarachnoid hemorrhage) 07/14/2019  . Type 2 diabetes mellitus   . Eosinophilia   . Nocturnal leg cramps 11/23/2012  . Pain in joint, shoulder region 11/23/2012  . Hematoma 11/04/2012  . Hyperlipidemia type IIB / III 03/08/2008  . Orthostatic hypotension 03/08/2008  . Coronary atherosclerosis of native coronary artery 03/08/2008    Referring Diagnosis: R29.6 (ICD-10-CM) - Frequent falls Medical Diagnosis: R26.89 Other abnormalities of gait and mobility; Unsteadiness of feet R26.81; difficulty in walking R26.2; H81.13 BPPV  SUBJECTIVE:  Patient history:  Patient reports that the dizziness has improved since last visit, has been very minor. Patient reports that woke up yesterday morning  and for the first time "reports there was nothing in his head". Did have one stumble up the stairs, patient reports that he was rushing. Counting between transitions has helped per patient reports.   Pain: is patient experiencing pain? No    Patient Goals: to improve dizziness, reduce falls   Vestibular Asssessment     Symptom Behavior:   Subjective history: Patient reports that he has been dizziness since he had a fall. Patient reports that he has been having the  spinning sensation when he lays back. But this has improved since initial visit. Patient continues to have imbalance as well.    Non-Vestibular symptoms: nausea/vomiting   Type of dizziness: Imbalance (Disequilibrium), Spinning/Vertigo and Unsteady with head/body turns   Frequency: daily   Duration: < 30 seconds    Aggravating factors: Induced by position change: lying supine, supine to sit and sit to stand   Relieving factors: rest and slow movements   Progression of symptoms: better   Oculomotor Exam:   Ocular Alignment: normal   Ocular ROM: No Limitations   Spontaneous Nystagmus: absent   Gaze-Induced Nystagmus: absent   Smooth Pursuits: intact   Saccades: intact     Vestibular-Ocular Reflex (VOR):   Slow VOR: Normal   Head-Impulse Test: unable to assess due to patient's neck ROM limitations      Positional Testing:   Right Dix-Hallpike: 0, none  Left Dix-Hallpike: 0, none  Right Roll Test: 0, none  Left Roll Test: 0, none  Right Sidelying: 5 seconds; down beating, right nystagmus (upon rising; demonstrating reversal so indicating R Posterior BPPV Canalithiasis)   Left Sidelying: 0, none     Vestibular Treatment:   Canalith Repositioning:   Deep Head Hang: Number of Reps: 1, Response to Treatment: comment: no change in symptoms and Comment: attempted completion on table that allow for head to be lowered due to Cervical ROM limitations; patient unable to complete deep head hand position    Attempted R Semont, unable to complete fully due to symptoms upon rising and patient not able to complete maneuver.         Patient Education:  Education details: Educated on BPPV; Educating to continue to sleep on L Side Person educated: Patient and Spouse Education method: Customer service manager Education comprehension: verbalized understanding   Goals: Goals reviewed with patient? Yes    SHORT TERM GOALS:  STG Name Target Date Goal status  1 Pt will report <1  episodes of dizziness in a week to improve dizziness Comments: pt reports 1-2 dizziness episodes 05/04/20 INITIAL  2 Pt will demonstrate improved safety awareness by counting to 5 when changing positions to reduce fall frequency Comments:  05/04/20 INITIAL   LONG TERM GOALS:   LTG Name Target Date Goal status  1 Pt will report no falls or near falls in 3-4 consecutive weeks to reduce fall risk and improve safety Comments:  06/01/20 INITIAL  2 Pt will demo at least 5 points of change in Functional Gait Index to improve functional balance Comments: To be tested 06/01/20 INITIAL  3 Pt will report no symptoms of dizziness in 3 consecutive weeks to improve balance and safety with position changes. Comments: Eval: 1-2x/week 06/01/20 INITIAL      Plan - 04/11/20 1003    Clinical Impression Statement Today's skilled PT session included vestibular assesment, patient demonstrating downbeating nystagmus of short duration upon rising from R Semont demonstrating  reversal indicative of R Posterior Canal Canalithiasis. Attempted canalith repositioning treatments today however  unsuccessful due to patient's restricted Cervical ROM. Patient however reports feeling better overall, demonstrating improvements with PT Services. Will continue to assess and treat as indicated. No balance testing completed at this time due to unresolved BPPV.    Personal Factors and Comorbidities Age;Time since onset of injury/illness/exacerbation;Past/Current Experience;Comorbidity 2    Comorbidities DM, CAD, HTN, Alzheimer's Disease    Examination-Activity Limitations Bed Mobility;Transfers    Examination-Participation Restrictions Cleaning;Community Activity;Meal Prep;Medication Management;Yard Work    [employer]chant [employer] officer Evolving/Moderate complexity    Clinical Decision Making Moderate    Rehab Potential Fair    PT Frequency 2x / week    PT Duration 8 weeks    PT Treatment/Interventions ADLs/Self Care Home  Management;Canalith Repostioning;Cryotherapy;Moist Heat;Gait training;DME Instruction;Stair training;Functional mobility training;Therapeutic activities;Therapeutic exercise;Balance training;Neuromuscular re-education;Cognitive remediation;Patient/family education;Manual techniques;Passive range of motion;Dry needling;Vestibular    PT Next Visit Plan Reassess R BPPV. Try Semont.    Consulted and Agree with Plan of Care Patient;Family member/caregiver    Family Member Consulted Wife           Jones Bales, [place], DPT 04/11/2020, 12:09 PM  [facility] [address] [place], [place], [address] Phone: [phone]   Fax:  [phone]  Patient name: KEITH FELTEN MRN: [id number] DOB: 05/12/1933

## 2020-04-11 ENCOUNTER — Ambulatory Visit: Payer: PPO

## 2020-04-11 ENCOUNTER — Other Ambulatory Visit: Payer: Self-pay

## 2020-04-11 DIAGNOSIS — H8113 Benign paroxysmal vertigo, bilateral: Secondary | ICD-10-CM

## 2020-04-11 DIAGNOSIS — R2689 Other abnormalities of gait and mobility: Secondary | ICD-10-CM | POA: Diagnosis not present

## 2020-04-11 DIAGNOSIS — R2681 Unsteadiness on feet: Secondary | ICD-10-CM

## 2020-04-13 ENCOUNTER — Ambulatory Visit: Payer: PPO

## 2020-04-13 ENCOUNTER — Other Ambulatory Visit: Payer: Self-pay

## 2020-04-13 DIAGNOSIS — R2689 Other abnormalities of gait and mobility: Secondary | ICD-10-CM

## 2020-04-13 DIAGNOSIS — R2681 Unsteadiness on feet: Secondary | ICD-10-CM

## 2020-04-13 DIAGNOSIS — H8113 Benign paroxysmal vertigo, bilateral: Secondary | ICD-10-CM

## 2020-04-13 NOTE — Therapy (Signed)
Outpatient Physical Therapy Treatment Patient Name: [name] MRN: [id number] DOB:05-21-1933, 87 y.o., male Today's Date: 04/13/2020  PCP: [name], MD Referring Provier: [name], MD    PT End of Session - 04/13/20 1102    Visit Number 3    Number of Visits 13    Date for PT Re-Evaluation 06/01/20    PT Start Time 1102    PT Stop Time 1143    PT Time Calculation (min) 41 min    Equipment Utilized During Treatment Gait belt    Activity Tolerance Patient tolerated treatment well    Behavior During Therapy WFL for tasks assessed/performed            Past Medical History:  Diagnosis Date  . Acute blood loss anemia   . Acute upper respiratory infections of unspecified site 11/04/2012  . AKI (acute kidney injury)   . Benign prostatic hyperplasia   . CAD (coronary artery disease)    s/p cypher DES to pLAD 6/08; normal LVF;  ETT-Myoview 2009: no ischemia   . Clavicle fracture 08/10/2019  . Closed displaced fracture of phalanx of left thumb, sequela 08/10/2019  . Coronary atherosclerosis of native coronary artery 03/08/2008  . Eosinophilia   . Essential hypertension   . Fracture of multiple ribs with pain 08/10/2019  . History of multiple falls   . History of SAH (subarachnoid hemorrhage) 07/14/2019  . Hyperlipidemia type IIB / III 03/08/2008  . Major neurocognitive disorder due to possible Alzheimer's disease, without behavioral disturbance 02/29/2020  . MI (myocardial infarction)   . 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 Nocturnal leg cramps 11/23/2012  . Orthostatic hypotension 03/08/2008  . Pain in joint, shoulder region 11/23/2012  . Stage 3b chronic kidney disease   . Thrombocytopenia   . Trigeminal neuralgia   . Type 2 diabetes mellitus    Past Surgical History:  Procedure Laterality Date  . APPENDECTOMY    . CARDIAC CATHETERIZATION  07/30/2006   CORONARY ANGIOPLASTY WITH STENT  PLACEMENT  . CARDIAC CATHETERIZATION    . EXPLORATORY LAPAROTOMY     age 45  . EXPLORATORY LAPAROTOMY    . IR THORACENTESIS ASP PLEURAL SPACE W/IMG GUIDE  10/05/2019   Patient Active Problem List   Diagnosis Date Noted  . Dizziness 03/08/2020  . Major neurocognitive disorder due to possible Alzheimer's disease, without behavioral disturbance 02/29/2020  . CAD (coronary artery disease)   . MI (myocardial infarction)   . Trigeminal neuralgia   . 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 Multiple trauma   . Benign prostatic hyperplasia   . Essential hypertension   . Stage 3b chronic kidney disease ([place])   . Thrombocytopenia   . History of multiple falls 07/14/2019  . History of SAH (subarachnoid hemorrhage) 07/14/2019  . Type 2 diabetes mellitus   . Eosinophilia   . Nocturnal leg cramps 11/23/2012  . Pain in joint, shoulder region 11/23/2012  . Hematoma 11/04/2012  . Hyperlipidemia type IIB / III 03/08/2008  . Orthostatic hypotension 03/08/2008  . Coronary atherosclerosis of native coronary artery 03/08/2008    SUBJECTIVE: Patient reports that he has being feeling better overall. Did have brief episode of dizziness this morning rolling in bed. No falls.   Pain: is patient experiencing pain? No  OBJECTIVE:   Right Sidelying: upbeating, right nystagmus; Duration: 15 seconds. With reassessment at end after treatment last approx 5 seconds.  Vestibular Treatment   Canalith Repositioning:   Semont Right Posterior: Number of Reps: 4, Response to Treatment: symptoms improved and Comment: On last repetition, vibration applied posterior to R ear on mastoid process. One instance of mild nauseous requiring seated rest break, patient overall tolerating well. Demo improved in decreased duration of nystagmus and reduced symptoms reported.        Patient Education: Education details: BPPV Handout.  Person educated: Patient and Spouse Education method: Explanation  and Handouts Education comprehension: verbalized understanding   Goals: Goals reviewed with patient? Yes                          SHORT TERM GOALS:   STG Name Target Date Goal status  1 Pt will report <1 episodes of dizziness in a week to improve dizziness Comments: pt reports 1-2 dizziness episodes 05/04/20 INITIAL  2 Pt will demonstrate improved safety awareness by counting to 5 when changing positions to reduce fall frequency Comments:  05/04/20 INITIAL    LONG TERM GOALS:    LTG Name Target Date Goal status  1 Pt will report no falls or near falls in 3-4 consecutive weeks to reduce fall risk and improve safety Comments:  06/01/20 INITIAL  2 Pt will demo at least 5 points of change in Functional Gait Index to improve functional balance Comments: To be tested 06/01/20 INITIAL  3 Pt will report no symptoms of dizziness in 3 consecutive weeks to improve balance and safety with position changes. Comments: Eval: 1-2x/week 06/01/20 INITIAL       Plan - 04/13/20 1118    Clinical Impression Statement Today's skilled PT session included reassesment of R BPPV, patient continue to demonstrate R Upbeating Nystagmus of Short duration indicating R Posterior Canal Canalithiasis. Treated x 4 reps with Semont Manuever with addition of vibration applied. Patient needing intermittent rest breaks due to nauseous. Will conitnue to progress toward goals and treat as indicated.    Personal Factors and Comorbidities Age;Time since onset of injury/illness/exacerbation;Past/Current Experience;Comorbidity 2    Comorbidities DM, CAD, HTN, Alzheimer's Disease    Examination-Activity Limitations Bed Mobility;Transfers    Examination-Participation Restrictions Cleaning;Community Activity;Meal Prep;Medication Management;Yard Work    [employer] Evolving/Moderate complexity    Rehab Potential Fair    PT Frequency 2x / week    PT Duration 8 weeks    PT Treatment/Interventions ADLs/Self Care  Home Management;Canalith Repostioning;Cryotherapy;Moist Heat;Gait training;DME Instruction;Stair training;Functional mobility training;Therapeutic activities;Therapeutic exercise;Balance training;Neuromuscular re-education;Cognitive remediation;Patient/family education;Manual techniques;Passive range of motion;Dry needling;Vestibular    PT Next Visit Plan Reassess R BPPV. Treat of Semont. Assess balance once BPPV is cleared.    Consulted and Agree with Plan of Care Patient;Family member/caregiver    Family Member Consulted Wife            Referring Diagnosis:R29.6 (ICD-10-CM) - Frequent falls Visit Diagnosis:R26.89 Other abnormalities of gait and mobility; Unsteadiness of feet R26.81; difficulty in walking R26.2; H81.13 BPPV  Jones Bales, PT, DPT   [facility] [address] [place], [place], [address] Phone: [phone]   Fax:  [phone]  Patient name: [name] MRN: [id number] DOB: [birth year]

## 2020-04-15 ENCOUNTER — Other Ambulatory Visit: Payer: Self-pay

## 2020-04-15 ENCOUNTER — Ambulatory Visit
Admission: RE | Admit: 2020-04-15 | Discharge: 2020-04-15 | Disposition: A | Discharge status: Home / Self Care | Payer: PPO | Source: Ambulatory Visit | Attending: Neurology | Admitting: Neurology

## 2020-04-15 DIAGNOSIS — R413 Other amnesia: Secondary | ICD-10-CM | POA: Diagnosis not present

## 2020-04-15 DIAGNOSIS — F0390 Unspecified dementia without behavioral disturbance: Secondary | ICD-10-CM

## 2020-04-15 DIAGNOSIS — I618 Other nontraumatic intracerebral hemorrhage: Secondary | ICD-10-CM | POA: Diagnosis not present

## 2020-04-15 DIAGNOSIS — R292 Abnormal reflex: Secondary | ICD-10-CM

## 2020-04-15 DIAGNOSIS — J3489 Other specified disorders of nose and nasal sinuses: Secondary | ICD-10-CM | POA: Diagnosis not present

## 2020-04-15 DIAGNOSIS — M542 Cervicalgia: Secondary | ICD-10-CM

## 2020-04-15 DIAGNOSIS — M4802 Spinal stenosis, cervical region: Secondary | ICD-10-CM | POA: Diagnosis not present

## 2020-04-15 DIAGNOSIS — I6782 Cerebral ischemia: Secondary | ICD-10-CM | POA: Diagnosis not present

## 2020-04-15 DIAGNOSIS — R42 Dizziness and giddiness: Secondary | ICD-10-CM

## 2020-04-15 IMAGING — MR MR CERVICAL SPINE W/O CM
5 series · 36 of 48 positions shown · non-contrast
Comparison: [DATE] cervical spine MRI, [DATE] cervical
spine CT, and [DATE] head and neck CTA

CLINICAL DATA: Neck pain.  Hyperreflexia.  Dizziness.

EXAM:
MRI CERVICAL SPINE WITHOUT CONTRAST
TECHNIQUE: Multiplanar, multisequence MR imaging of the cervical spine was
performed. No intravenous contrast was administered.

[Series 3: T2 · sagittal · 3.0mm · 0.41mm/px · 7 of 17 slices shown (1 of 2)]
[im 1/17]
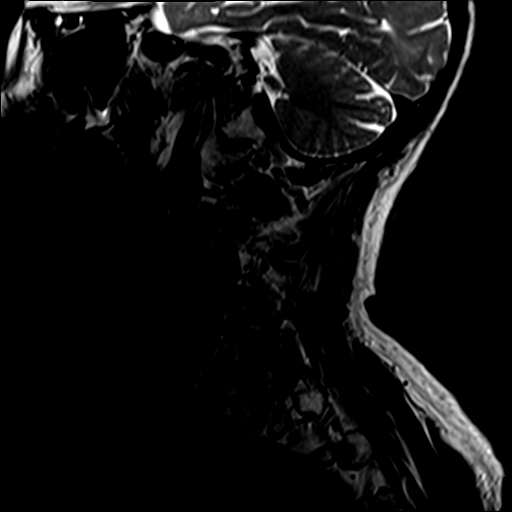
[im 3/17]
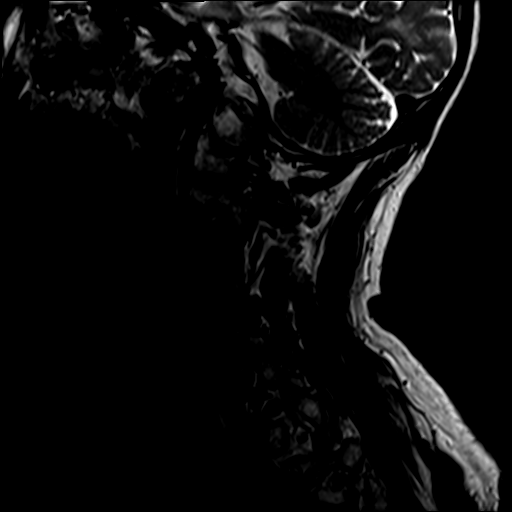
[im 6/17]
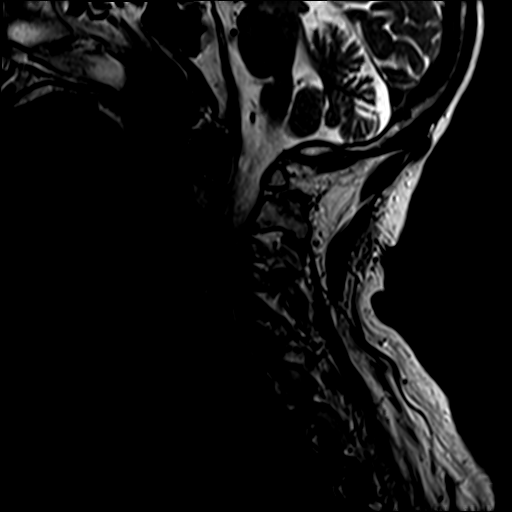
[im 9/17]
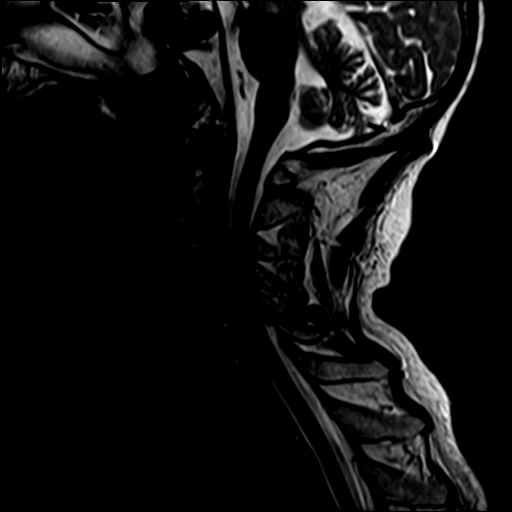
[im 11/17]
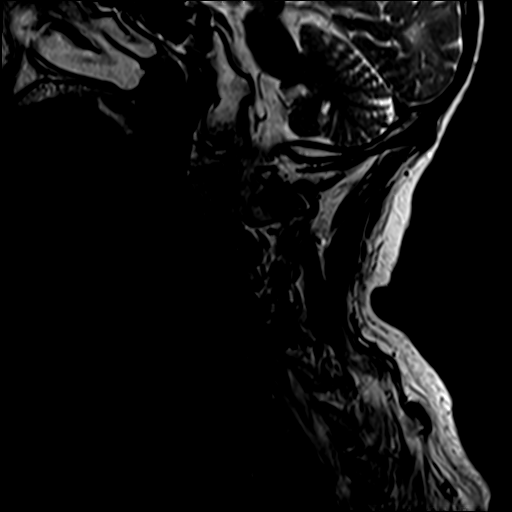
[im 14/17]
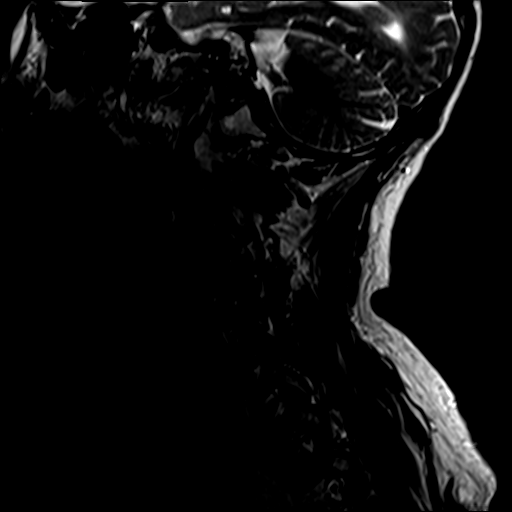
[im 17/17]
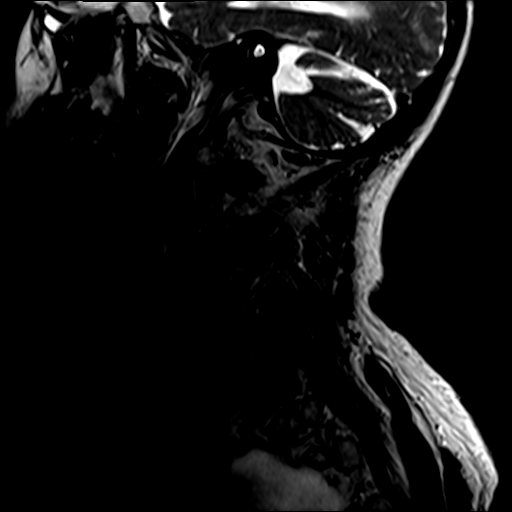

[Series 4: STIR · sagittal · 3.0mm · 0.82mm/px · 8 of 17 slices shown]
[im 1/17]
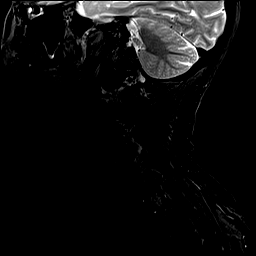
[im 3/17]
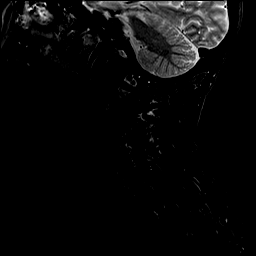
[im 5/17]
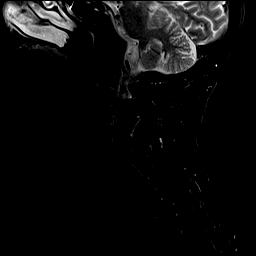
[im 7/17]
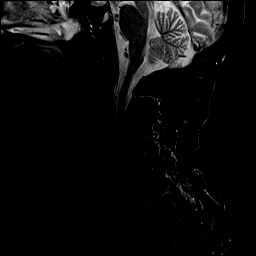
[im 10/17]
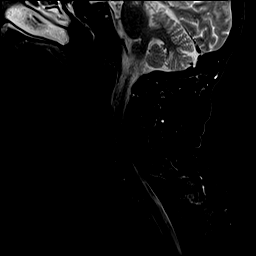
[im 12/17]
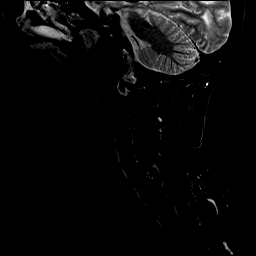
[im 14/17]
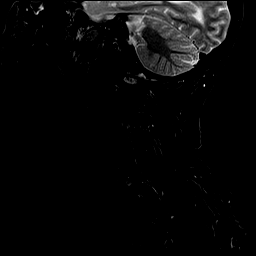
[im 17/17]
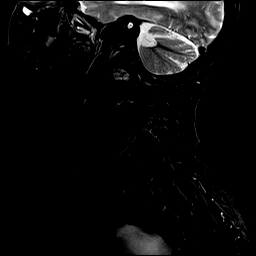

[Series 5: T1 · sagittal · 3.0mm · 0.82mm/px · 8 of 17 slices shown]
[im 1/17]
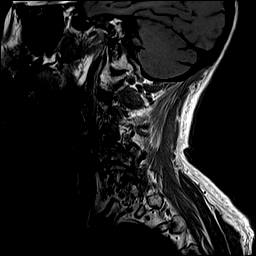
[im 3/17]
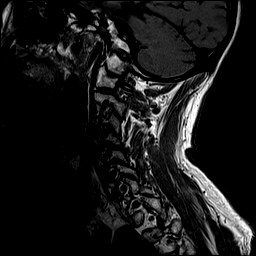
[im 5/17]
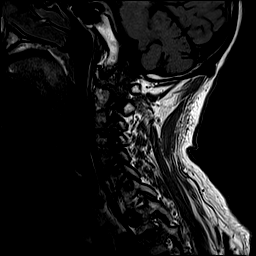
[im 7/17]
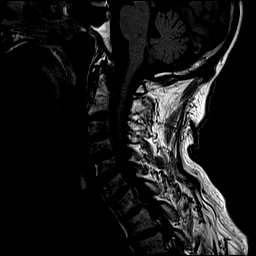
[im 10/17]
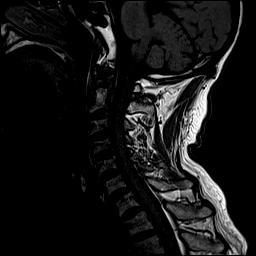
[im 12/17]
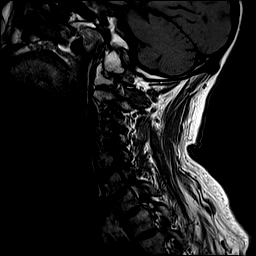
[im 14/17]
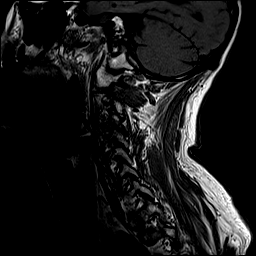
[im 17/17]
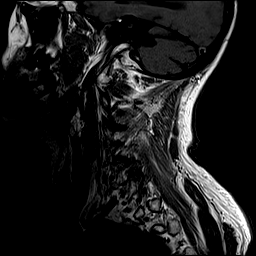

[Series 6: T2 · axial · 3.0mm · 0.70mm/px · z∈[-174,-75]mm · 9 of 28 slices shown (2 of 2)]
[im 1/28]
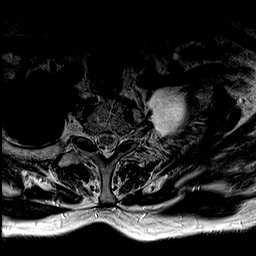
[im 5/28]
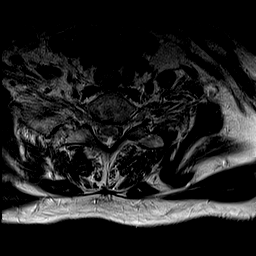
[im 10/28]
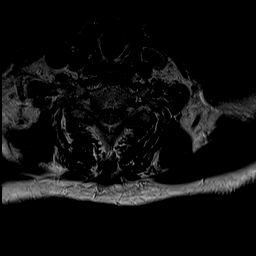
[im 12/28]
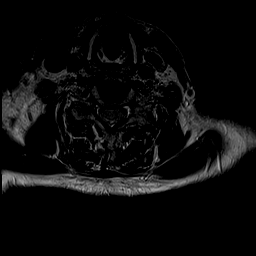
[im 14/28]
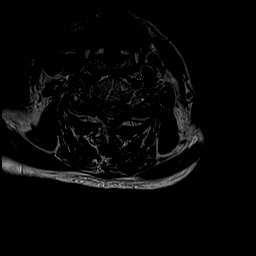
[im 16/28]
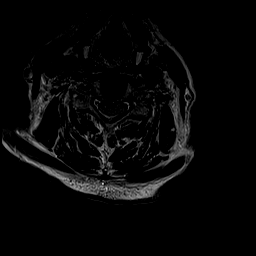
[im 19/28]
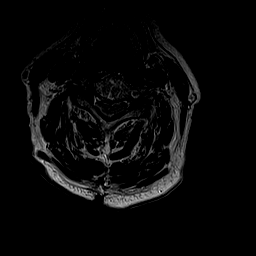
[im 23/28]
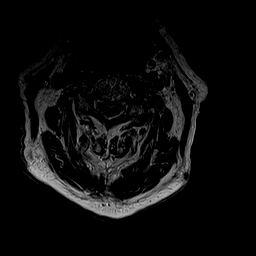
[im 28/28]
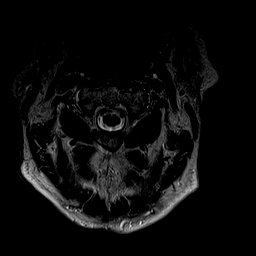

[Series 7: GRE · axial · 3.0mm · 0.35mm/px · z∈[-172,-132]mm · 4 of 26 slices shown]
[im 1/26]
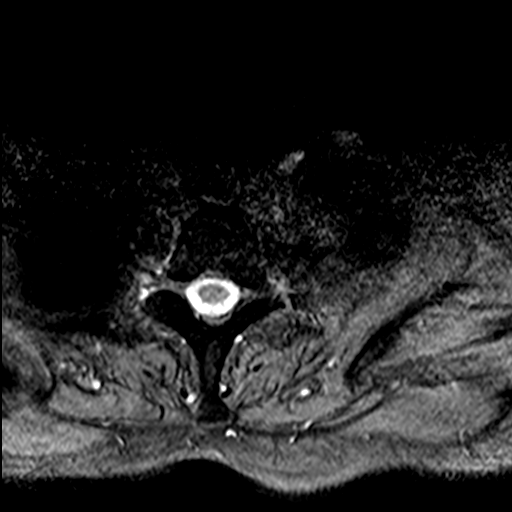
[im 5/26]
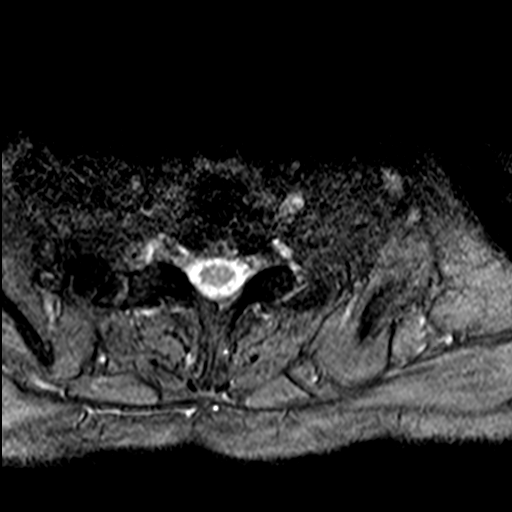
[im 7/26]
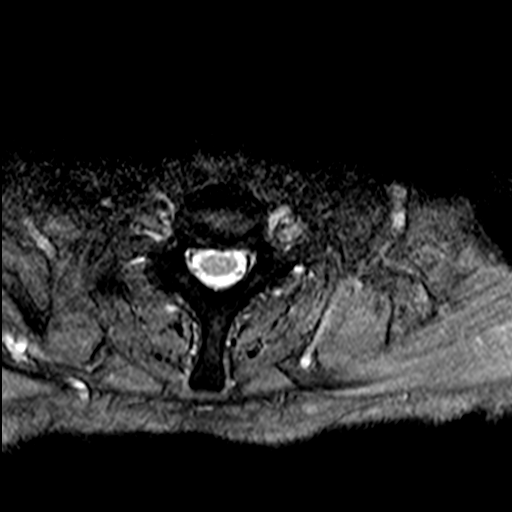
[im 12/26]
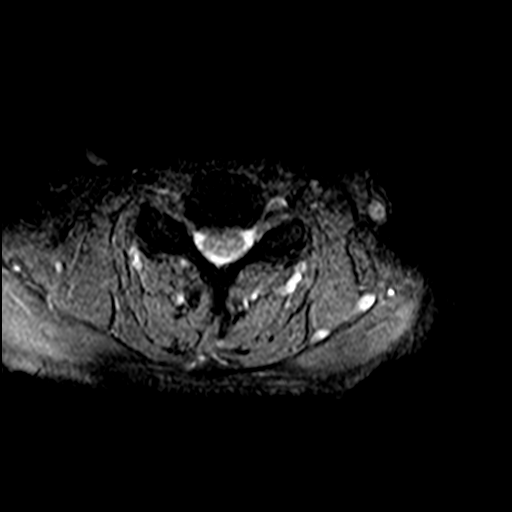

[36 of 48 positions shown; findings below may reference images not displayed]

FINDINGS: Alignment: Unchanged and near anatomic.

Vertebrae: Unchanged mild chronic anterior wedging of the C7 and T1
vertebral bodies. No acute fracture or suspicious osseous lesion.
Low level bilateral facet edema at C3-4.

Cord: Normal signal.

Posterior Fossa, vertebral arteries, paraspinal tissues: Chronically
absent left vertebral artery flow void, shown to be occluded on the
prior CTA. Partially visualized pleural fluid at the left lung apex,
also present previously. Posterior fossa more fully evaluated on
separate head MRI.

Disc levels:

C2-3: A small central disc protrusion results in borderline to mild
spinal stenosis, unchanged. Uncovertebral spurring and severe left
facet arthrosis result in mild-to-moderate left neural foraminal
stenosis, unchanged.

C3-4: A central disc protrusion, uncovertebral spurring, infolding
of the ligamentum flavum, and mild right and severe left facet
arthrosis result in mild-to-moderate spinal stenosis with mild cord
flattening and mild right and moderate left neural foraminal
stenosis, unchanged.

C4-5: A right central disc osteophyte complex, uncovertebral
spurring, infolding of the ligamentum flavum, and severe right and
mild left facet arthrosis result in moderate spinal stenosis with
mild cord flattening and moderate right and mild left neural
foraminal stenosis, unchanged.

C5-6: Disc bulging, uncovertebral spurring, and moderate right facet
arthrosis result in mild spinal stenosis and moderate right neural
foraminal stenosis, unchanged.

C6-7: Disc bulging, right uncovertebral spurring, and mild facet
arthrosis result in moderate right neural foraminal stenosis without
spinal stenosis, unchanged.

C7-T1: Negative.
IMPRESSION: 1. Unchanged cervical disc and facet degeneration with moderate
spinal stenosis at C4-5 and milder spinal stenosis at C3-4 and C5-6.
2. Moderate multilevel neural foraminal stenosis as above.

## 2020-04-15 IMAGING — MR MR HEAD WO/W CM
12 series · 48 of 48 positions shown · IV contrast (multihance)
Comparison: Head and neck CTA [DATE]

CLINICAL DATA: Memory loss.

EXAM:
MRI HEAD WITHOUT AND WITH CONTRAST
TECHNIQUE: Multiplanar, multiecho pulse sequences of the brain and surrounding
structures were obtained without and with intravenous contrast.
CONTRAST:  15mL MULTIHANCE GADOBENATE DIMEGLUMINE 529 MG/ML IV SOLN

[Series 3: T1 · sagittal · 5.0mm · 0.45mm/px · 3 of 26 slices shown]
[im 1/26]
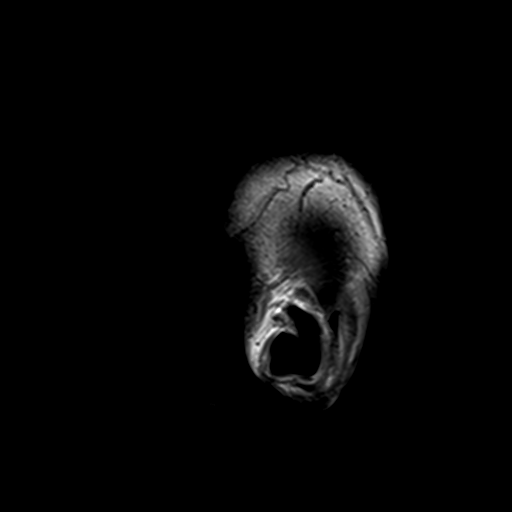
[im 13/26]
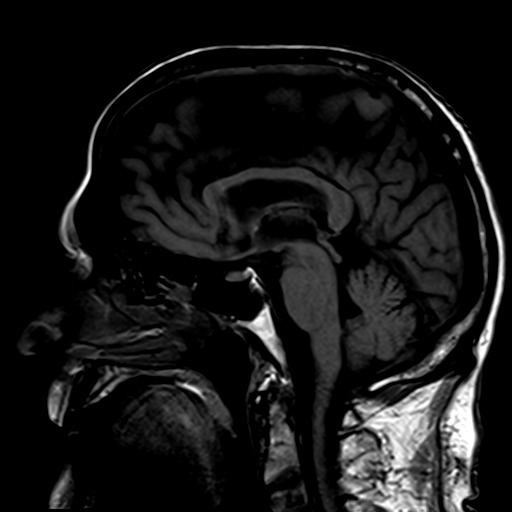
[im 26/26]
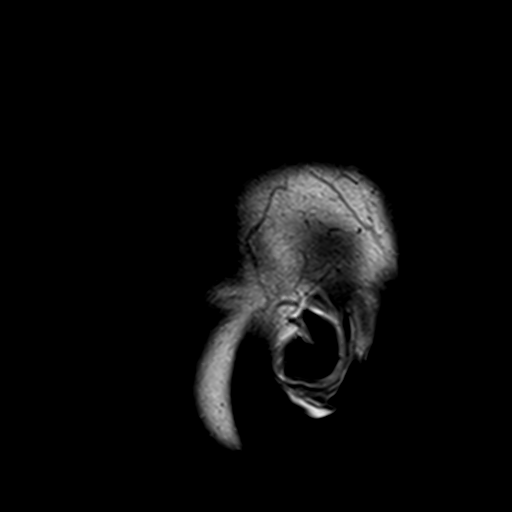

[Series 4: DWI · axial · 3.0mm · 1.80mm/px · z∈[+74,+220]mm · 7 of 100 slices shown (1 of 4)]
[im 1/100]
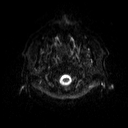
[im 17/100]
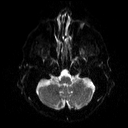
[im 34/100]
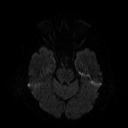
[im 50/100]
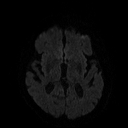
[im 67/100]
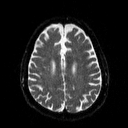
[im 83/100]
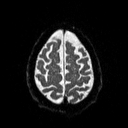
[im 100/100]
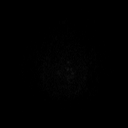

[Series 5: DWI · axial · 3.0mm · 1.80mm/px · z∈[+74,+220]mm · 3 of 50 slices shown (2 of 4)]
[im 1/50]
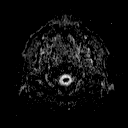
[im 25/50]
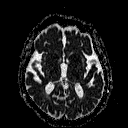
[im 50/50]
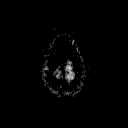

[Series 6: DWI · coronal · 5.0mm · 1.80mm/px · 4 of 65 slices shown (3 of 4)]
[im 1/65]
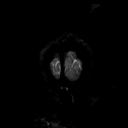
[im 22/65]
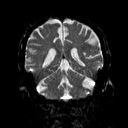
[im 43/65]
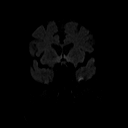
[im 65/65]
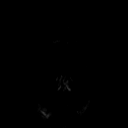

[Series 7: DWI · coronal · 5.0mm · 1.80mm/px · 2 of 34 slices shown (4 of 4)]
[im 1/34]
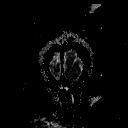
[im 34/34]
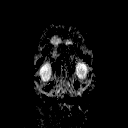

[Series 8: T2 · axial · 5.0mm · 0.90mm/px · 1 of 22 slices shown (1 of 2)]
[im 1/22]
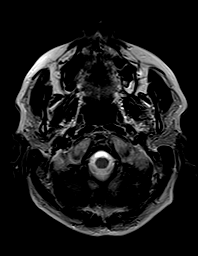

[Series 9: FLAIR · axial · 3.0mm · 0.45mm/px · z∈[+80,+214]mm · 2 of 30 slices shown]
[im 1/30]
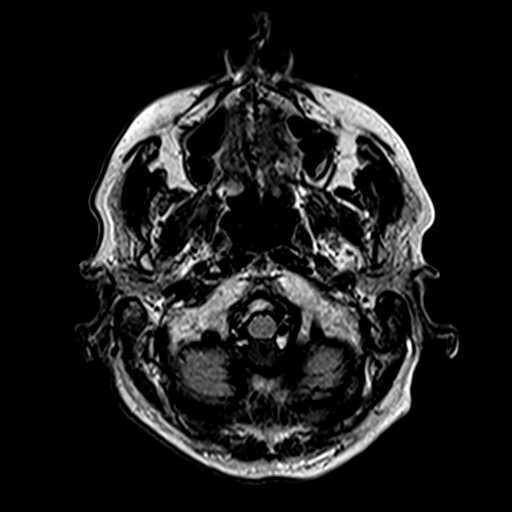
[im 30/30]
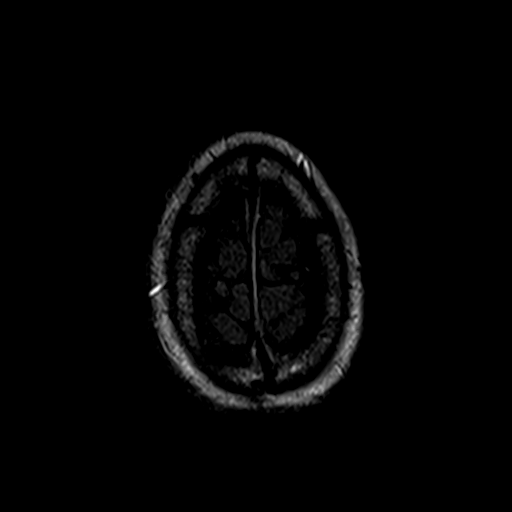

[Series 11: swi_images · axial · 4.0mm · 0.90mm/px · z∈[+78,+217]mm · 2 of 36 slices shown]
[im 1/36]
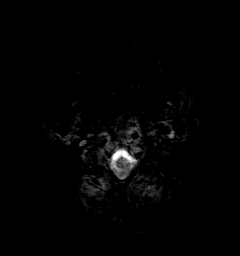
[im 36/36]
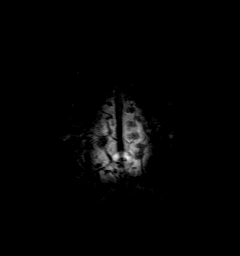

[Series 12: t1_mpr_tra · axial · 1.0mm · 0.75mm/px · z∈[+77,+220]mm · 10 of 144 slices shown]
[im 1/144]
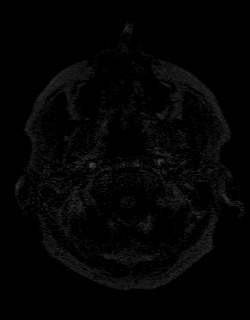
[im 16/144]
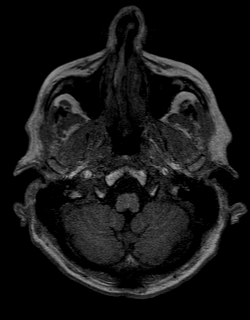
[im 32/144]
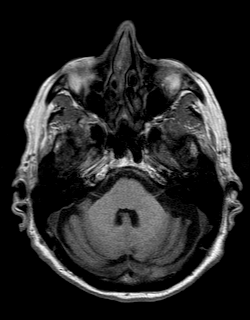
[im 48/144]
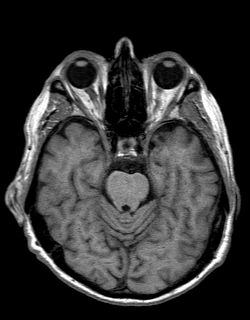
[im 64/144]
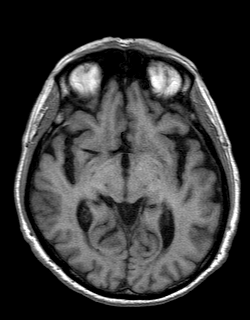
[im 80/144]
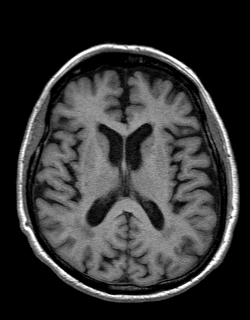
[im 96/144]
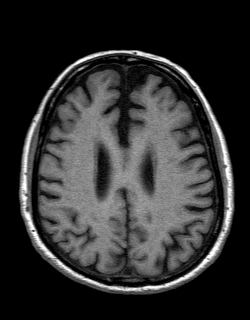
[im 112/144]
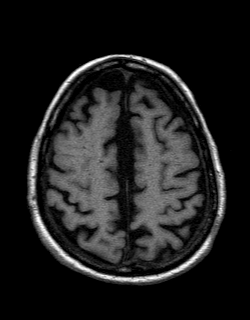
[im 128/144]
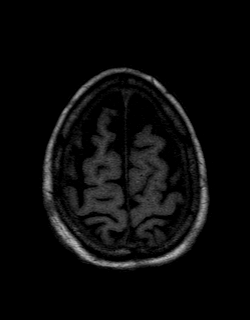
[im 144/144]
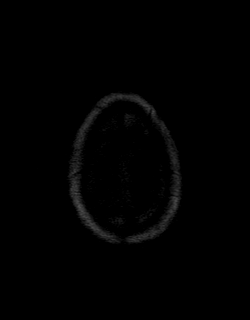

[Series 13: T2 · coronal · 5.0mm · 0.45mm/px · 2 of 30 slices shown (2 of 2)]
[im 1/30]
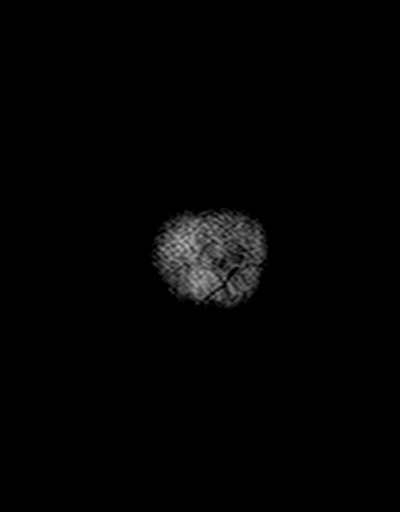
[im 30/30]
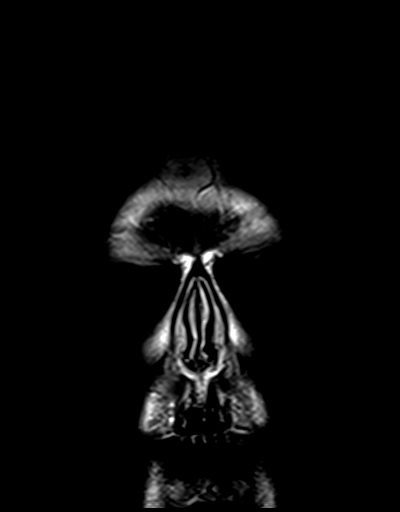

[Series 14: t1_mpr_tra post · axial · 1.0mm · 0.75mm/px · z∈[+77,+220]mm · 10 of 144 slices shown]
[im 1/144]
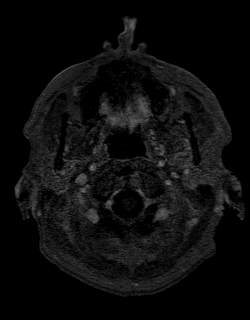
[im 16/144]
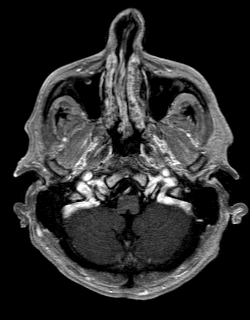
[im 32/144]
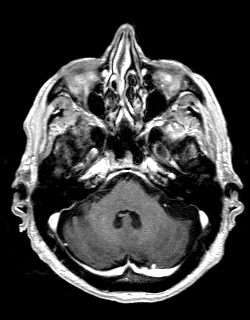
[im 48/144]
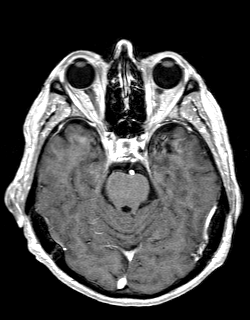
[im 64/144]
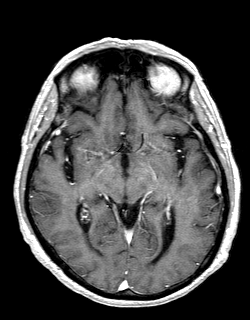
[im 80/144]
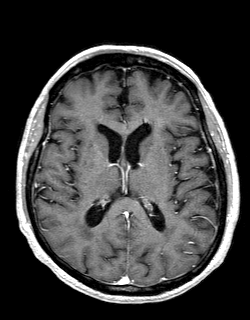
[im 96/144]
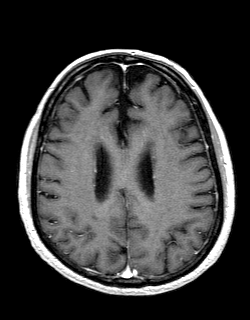
[im 112/144]
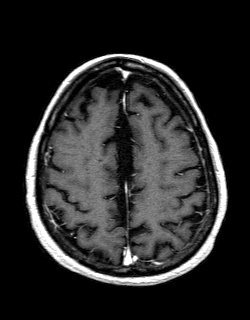
[im 128/144]
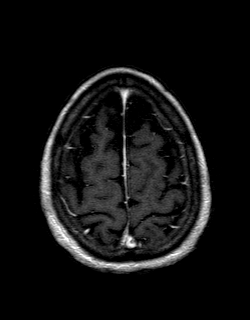
[im 144/144]
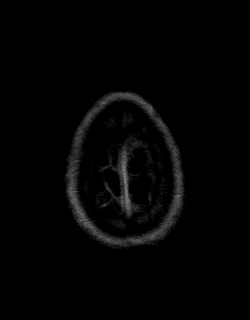

[Series 15: post cor · coronal · 5.0mm · 0.45mm/px · 2 of 30 slices shown]
[im 1/30]
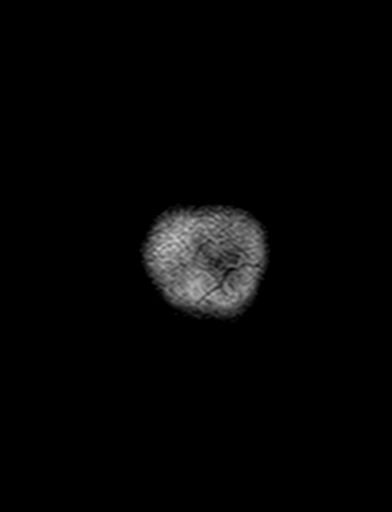
[im 30/30]
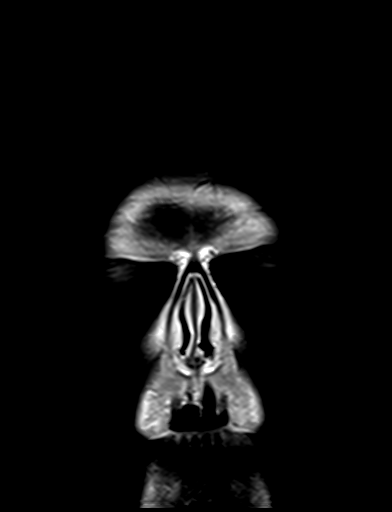

[48 of 48 positions shown; findings below may reference images not displayed]

FINDINGS: Brain: No acute infarct, mass, midline shift, or extra-axial fluid
collection is identified. There is scattered superficial siderosis
over the left frontal and parietal convexities compatible with
remote subarachnoid hemorrhage. A few scattered chronic parenchymal
microhemorrhages are also noted in the left cerebral hemisphere. T2
hyperintensities in the cerebral white matter bilaterally are
nonspecific but compatible with mild chronic small vessel ischemic
disease. No abnormal enhancement is identified. Mild cerebral
atrophy is without lobar predominance and does not appear advanced
for age.

Vascular: Major intracranial vascular flow voids are preserved.

Skull and upper cervical spine: Unremarkable bone marrow signal.

Sinuses/Orbits: Unremarkable orbits. Minimal mucosal thickening in
the paranasal sinuses. Clear mastoid air cells.

Other: None.
IMPRESSION: 1. No acute intracranial abnormality.
2. Mild chronic small vessel ischemic disease.
3. Superficial siderosis over the left cerebral convexity consistent
with remote subarachnoid hemorrhage.

## 2020-04-15 MED ORDER — GADOBENATE DIMEGLUMINE 529 MG/ML IV SOLN
15.0000 mL | Freq: Once | INTRAVENOUS | Status: AC | PRN
  Administered 2020-04-15: 15 mL via INTRAVENOUS

## 2020-04-15 NOTE — Therapy (Addendum)
Outpatient Physical Therapy Treatment Patient Name: [name] MRN: [id number] DOB:1933-05-10, 88 y.o., male Today's Date: 04/18/2020  PCP: [name], MD Referring Provier: [name], MD    PT End of Session - 04/18/20 1015    Visit Number 4    Number of Visits 13    Date for PT Re-Evaluation 06/01/20    PT Start Time 1016    PT Stop Time 1059    PT Time Calculation (min) 43 min    Equipment Utilized During Treatment Gait belt    Activity Tolerance Patient tolerated treatment well    Behavior During Therapy WFL for tasks assessed/performed            Past Medical History:  Diagnosis Date  . Acute blood loss anemia   . Acute upper respiratory infections of unspecified site 11/04/2012  . AKI (acute kidney injury)   . Benign prostatic hyperplasia   . CAD (coronary artery disease)    s/p cypher DES to pLAD 6/08; normal LVF;  ETT-Myoview 2009: no ischemia   . Clavicle fracture 08/10/2019  . Closed displaced fracture of phalanx of left thumb, sequela 08/10/2019  . Coronary atherosclerosis of native coronary artery 03/08/2008  . Eosinophilia   . Essential hypertension   . Fracture of multiple ribs with pain 08/10/2019  . History of multiple falls   . History of SAH (subarachnoid hemorrhage) 07/14/2019  . Hyperlipidemia type IIB / III 03/08/2008  . Major neurocognitive disorder due to possible Alzheimer's disease, without behavioral disturbance 02/29/2020  . MI (myocardial infarction)   . 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 Nocturnal leg cramps 11/23/2012  . Orthostatic hypotension 03/08/2008  . Pain in joint, shoulder region 11/23/2012  . Stage 3b chronic kidney disease   . Thrombocytopenia   . Trigeminal neuralgia   . Type 2 diabetes mellitus    Past Surgical History:  Procedure Laterality Date  . APPENDECTOMY    . CARDIAC CATHETERIZATION  07/30/2006   CORONARY ANGIOPLASTY WITH STENT  PLACEMENT  . CARDIAC CATHETERIZATION    . EXPLORATORY LAPAROTOMY     age 38  . EXPLORATORY LAPAROTOMY    . IR THORACENTESIS ASP PLEURAL SPACE W/IMG GUIDE  10/05/2019   Patient Active Problem List   Diagnosis Date Noted  . Dizziness 03/08/2020  . Major neurocognitive disorder due to possible Alzheimer's disease, without behavioral disturbance 02/29/2020  . CAD (coronary artery disease)   . MI (myocardial infarction)   . Trigeminal neuralgia   . 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 Multiple trauma   . Benign prostatic hyperplasia   . Essential hypertension   . Stage 3b chronic kidney disease ([place])   . Thrombocytopenia   . History of multiple falls 07/14/2019  . History of SAH (subarachnoid hemorrhage) 07/14/2019  . Type 2 diabetes mellitus   . Eosinophilia   . Nocturnal leg cramps 11/23/2012  . Pain in joint, shoulder region 11/23/2012  . Hematoma 11/04/2012  . Hyperlipidemia type IIB / III 03/08/2008  . Orthostatic hypotension 03/08/2008  . Coronary atherosclerosis of native coronary artery 03/08/2008     SUBJECTIVE: Patient reports two episodes of nauseous when he got up to go to the bathroom. Reports mild nauseous sensation currently. No falls, but does report he has had two episodes of veering to the Left Side.   Pain: is patient experiencing pain? No  OBJECTIVE:   Right Roll Test: none; Duration:  0 seconds  Left Roll Test: none; Duration: 0 seconds Right Sidelying: none; Duration: 0 seconds. Mild nauseous sensation, no spinning reported.  Left Sidelying: none; Duration: 0 seconds  Vestibular Treatment:   Canalith Repositioning:   No Canalith repositioning completed this session due to demo resolution of BPPV with positional testing. Will continue to assess.    PT Treatment:  Functional Gait Assessment (FGA) Requirements: A marked 6-m (20-ft) walkway that is marked with a 30.48-cm (12-in) width.  2 1. GAIT LEVEL SURFACE Instructions:  Walk at your normal speed from here to the next mark (6 m[20 ft]). Grading: [place] the highest category that applies. (3) Normal--Walks 6 m (20 ft) in less than 5.5 seconds, no assistive devices, good speed, no evidence for imbalance, normal gait pattern, deviates no more than 15.24 cm (6 in) outside of the 30.48-cm (12-in) walkway width. (2) Mild impairment--Walks 6 m (20 ft) in less than 7 seconds but greater than 5.5 seconds, uses assistive device, slower speed, mild gait deviations, or deviates 15.24-25.4 cm (6-10 in) outside of the 30.48-cm (12-in) walkway width. (1) Moderate impairment--Walks 6 m (20 ft), slow speed, abnormal gait pattern, evidence for imbalance, or deviates 25.4-38.1 cm (10-15 in) outside of the 30.48-cm (12-in) walkway width. Requires more than 7 seconds to ambulate 6 m (20 ft). (0) Severe impairment--Cannot walk 6 m (20 ft) without assistance,severe gait deviations or imbalance, deviates greater than 38.1 cm (15 in) outside of the 30.48-cm (12-in) walkway width or reaches and touches the wall.  2 2. CHANGE IN GAIT SPEED Instructions: Begin walking at your normal pace (for 1.5 m [5 ft]). When I tell you "go," walk as fast as you can (for 1.5 m [5 ft]). When I tell you "slow," walk as slowly as you can (for 1.5 m [5 ft]). Grading: [place] the highest category that applies. (3) Normal--Able to smoothly change walking speed without loss of balance or gait deviation. Shows a significant difference in walking speeds between normal, fast, and slow speeds. Deviates no more than 15.24 cm (6 in) outside of the 30.48-cm (12-in) walkway width. (2) Mild impairment--Is able to change speed but demonstrates mild gait deviations, deviates 15.24-25.4 cm (6-10 in) outside of the 30.48-cm (12-in) walkway width, or no gait deviations but unable to achieve a significant change in velocity, or uses an assistive device. (1) Moderate impairment--Makes only minor adjustments to walking speed, or accomplishes  a change in speed with significant gait deviations, deviates 25.4-38.1 cm (10-15 in) outside the 30.48-cm (12-in) walkway width, or changes speed but loses balance but is able to recover and continue walking. (0) Severe impairment--Cannot change speeds, deviates greater than 38.1 cm (15 in) outside 30.48-cm (12-in) walkway width, or loses balance and has to reach for wall or be caught.  2 3. GAIT WITH HORIZONTAL HEAD TURNS Instructions: Walk from here to the next mark 6 m (20 ft) away. Begin walking at your normal pace. Keep walking straight; after 3 steps, turn your head to the right and keep walking straight while looking to the right. After 3 more steps, turn your head to the left and keep walking straight while looking left. Continue alternating looking right and left every 3 steps until you have completed 2 repetitions in each direction. Grading: [place] the highest category that applies. (3) Normal--Performs head turns smoothly with no change in gait. Deviates no more than 15.24 cm (6 in) outside 30.48-cm (12-in) walkway width. (2) Mild impairment--Performs head turns smoothly with slight change in gait velocity (  eg, minor disruption to smooth gait path), deviates 15.24-25.4 cm (6-10 in) outside 30.48-cm (12-in) walkway width, or uses an assistive device.  (1) Moderate impairment--Performs head turns with moderate change in gait velocity, slows down, deviates 25.4-38.1 cm (10-15 in) outside 30.48-cm (12-in) walkway width but recovers, can continue to walk. (0) Severe impairment--Performs task with severe disruption of gait (eg, staggers 38.1 cm [15 in] outside 30.48-cm (12-in) walkway width, loses balance, stops, or reaches for wall).  2 4. GAIT WITH VERTICAL HEAD TURNS Instructions: Walk from here to the next mark (6 m [20 ft]). Begin walking at your normal pace. Keep walking straight; after 3 steps, tip your head up and keep walking straight while looking up. After 3 more steps, tip your head down,  keep walking straight while looking down. Continue alternating looking up and down every 3 steps until you have completed 2 repetitions in each direction. Grading: [place] the highest category that applies. (3) Normal--Performs head turns with no change in gait. Deviates no more than 15.24 cm (6 in) outside 30.48-cm (12-in) walkway width. (2) Mild impairment--Performs task with slight change in gait velocity (eg, minor disruption to smooth gait path), deviates 15.24-25.4 cm (6-10 in) outside 30.48-cm (12-in) walkway width or uses assistive device. (1) Moderate impairment--Performs task with moderate change in gait velocity, slows down, deviates 25.4-38.1 cm (10-15 in) outside 30.48-cm (12-in) walkway width but recovers, can continue to walk. (0) Severe impairment--Performs task with severe disruption of gait (eg, staggers 38.1 cm [15 in] outside 30.48-cm (12-in) walkway width, loses balance, stops, reaches for wall).  3 5. GAIT AND PIVOT TURN Instructions: Begin with walking at your normal pace. When I tell you, "turn and stop," turn as quickly as you can to face the opposite direction and stop. Grading: [place] the highest category that applies. (3) Normal--Pivot turns safely within 3 seconds and stops quickly with no loss of balance. (2) Mild impairment--Pivot turns safely in _3 seconds and stops with no loss of balance, or pivot turns safely within 3 seconds and stops with mild imbalance, requires small steps to catch balance. (1) Moderate impairment--Turns slowly, requires verbal cueing, or requires several small steps to catch balance following turn and stop. (0) Severe impairment--Cannot turn safely, requires assistance to turn and stop.  2 6. STEP OVER OBSTACLE Instructions: Begin walking at your normal speed. When you come to the shoe box, step over it, not around it, and keep walking. Grading: [place] the highest category that applies. (3) Normal--Is able to step over 2 stacked shoe boxes taped  together (22.86 cm [9 in] total height) without changing gait speed; no evidence of imbalance. (2) Mild impairment--Is able to step over one shoe box (11.43 cm [4.5 in] total height) without changing gait speed; no evidence of imbalance. (1) Moderate impairment--Is able to step over one shoe box (11.43 cm [4.5 in] total height) but must slow down and adjust steps to clear box safely. May require verbal cueing. (0) Severe impairment--Cannot perform without assistance.  0 7. GAIT WITH NARROW BASE OF SUPPORT Instructions: Walk on the floor with arms folded across the chest, feet aligned heel to toe in tandem for a distance of 3.6 m [12 ft]. The number of steps taken in a straight line are counted for a maximum of 10 steps. Grading: [place] the highest category that applies. (3) Normal--Is able to ambulate for 10 steps heel to toe with no staggering. (2) Mild impairment--Ambulates 7-9 steps. (1) Moderate impairment--Ambulates 4-7 steps. (0) Severe impairment--Ambulates less  than 4 steps heel to toe or cannot perform without assistance.  1 8. GAIT WITH EYES CLOSED Instructions: Walk at your normal speed from here to the next mark (6 m [20 ft]) with your eyes closed. Grading: [place] the highest category that applies. (3) Normal--Walks 6 m (20 ft), no assistive devices, good speed, no evidence of imbalance, normal gait pattern, deviates no more than 15.24 cm (6 in) outside 30.48-cm (12-in) walkway width. Ambulates 6 m (20 ft) in less than 7 seconds. (2) Mild impairment--Walks 6 m (20 ft), uses assistive device, slower speed, mild gait deviations, deviates 15.24-25.4 cm (6-10 in) outside 30.48-cm (12-in) walkway width. Ambulates 6 m (20 ft) in less than 9 seconds but greater than 7 seconds. (1) Moderate impairment--Walks 6 m (20 ft), slow speed, abnormal gait pattern, evidence for imbalance, deviates 25.4-38.1 cm (10-15 in) outside 30.48-cm (12-in) walkway width. Requires more than 9 seconds to ambulate 6 m  (20 ft). (0) Severe impairment--Cannot walk 6 m (20 ft) without assistance, severe gait deviations or imbalance, deviates greater than 38.1 cm (15 in) outside 30.48-cm (12-in) walkway width or will not attempt task.  2 9. AMBULATING BACKWARDS Instructions: Walk backwards until I tell you to stop. Grading: [place] the highest category that applies. (3) Normal--Walks 6 m (20 ft), no assistive devices, good speed, no evidence for imbalance, normal gait pattern, deviates no more than 15.24 cm (6 in) outside 30.48-cm (12-in) walkway width. (2) Mild impairment--Walks 6 m (20 ft), uses assistive device, slower speed, mild gait deviations, deviates 15.24-25.4 cm (6-10 in) outside 30.48-cm (12-in) walkway width. (1) Moderate impairment--Walks 6 m (20 ft), slow speed, abnormal gait pattern, evidence for imbalance, deviates 25.4-38.1 cm (10-15 in) outside 30.48-cm (12-in) walkway width. (0) Severe impairment--Cannot walk 6 m (20 ft) without assistance, severe gait deviations or imbalance, deviates greater than 38.1 cm (15 in) outside 30.48-cm (12-in) walkway width or will not attempt task.  2 10. STEPS Instructions: Walk up these stairs as you would at home (ie, using the rail if necessary). At the top turn around and walk down. Grading: [place] the highest category that applies. (3) Normal--Alternating feet, no rail. (2) Mild impairment--Alternating feet, must use rail. (1) Moderate impairment--Two feet to a stair; must use rail. (0) Severe impairment--Cannot do safely.  TOTAL SCORE: 18 /30 (MAXIMUM SCORE=30)  Scores of ? 22/30 on the FGA were found to be effective in predicting falls, Sensitivity 85%, Specificity 86% Scores of ? 20/30 on the FGA were optimal to predict older adults residing in community dwellings who would sustain unexplained falls in the next 6 months, Sensitivity 100%, Specificity 76% ([place], 2010; aged 56 to 56, Older Adults) [place]: 4.2 points for CVA Augustin Coupe et al, 2010) MCID: 8  points for Balance and Vestibular Disorders Marjorie Smolder and Augustin Coupe, 2014)    Completed M-CTSIB: Patient able to hold situation 1-3 for full 30 seconds, patient able to hold situation 4 on average: 5 seconds. Increased sway noted with narrow BOS and vision removed on complaint surface. Close Supervision.   Completed stair negotiation x 16 steps, use of single rail on R side. Patient ascending and descending with alternating pattern. Patient demo increased balance challenge with descent with reciprocal pattern. PT educating to trial with supervision with reciprocal pattern forwards, but only complete descend with step to pattern for improved safety.    Patient Education: Education details: Engineer, petroleum, educated to trial reciprocal pattern forwards with stairs, but step to pattern with descent. Fall Risk due to  FGA Score Person educated: Patient and Spouse Education method: Explanation Education comprehension: verbalized understanding   Goals: Goals reviewed with patient? Yes                          SHORT TERM GOALS:   STG Name Target Date Goal status  1 Pt will report <1 episodes of dizziness in a week to improve dizziness Comments: pt reports 1-2 dizziness episodes 05/04/20 INITIAL  2 Pt will demonstrate improved safety awareness by counting to 5 when changing positions to reduce fall frequency Comments:  05/04/20 INITIAL    LONG TERM GOALS:    LTG Name Target Date Goal status  1 Pt will report no falls or near falls in 3-4 consecutive weeks to reduce fall risk and improve safety Comments:  06/01/20 INITIAL  2 Pt will demo at least 5 points of change in Functional Gait Index to improve functional balance Comments: 18/30 06/01/20 INITIAL  3 Pt will report no symptoms of dizziness in 3 consecutive weeks to improve balance and safety with position changes. Comments: Eval: 1-2x/week 06/01/20 INITIAL       Plan - 04/18/20 1114    Clinical Impression Statement Completed reassesment of  BPPV today with no nystagmus present, as well as minimal nauseousness present compared to prior session. No BPPV treatment indicated today, will continue to assess. Began Balance assesment with FGA, patient scoring 18/30. PT educating to trail recirpocal pattenr with ascending staris, but to continue step to pattern with descent at this time.    Personal Factors and Comorbidities Age;Time since onset of injury/illness/exacerbation;Past/Current Experience;Comorbidity 2    Comorbidities DM, CAD, HTN, Alzheimer's Disease    Examination-Activity Limitations Bed Mobility;Transfers    Examination-Participation Restrictions Cleaning;Community Activity;Meal Prep;Medication Management;Yard Work    Merchant navy officer Evolving/Moderate complexity    Rehab Potential Fair    PT Frequency 2x / week    PT Duration 8 weeks    PT Treatment/Interventions ADLs/Self Care Home Management;Canalith Repostioning;Cryotherapy;Moist Heat;Gait training;DME Instruction;Stair training;Functional mobility training;Therapeutic activities;Therapeutic exercise;Balance training;Neuromuscular re-education;Cognitive remediation;Patient/family education;Manual techniques;Passive range of motion;Dry needling;Vestibular    PT Next Visit Plan Reassess for BPPV. Continue balnace + review HEP if BPPV stays cleared.    Consulted and Agree with Plan of Care Patient;Family member/caregiver    Family Member Consulted Wife          Therapy Diagnosis: Unsteadiness on feet  BPPV (benign paroxysmal positional vertigo), bilateral  Other abnormalities of gait and mobility  Difficulty in walking, not elsewhere classified  Referring Diagnosis:R29.6 (ICD-10-CM) - Frequent falls Visit Diagnosis:R26.89 Other abnormalities of gait and mobility; Unsteadiness of feet R26.81; difficulty in walking R26.2; H81.13 BPPV  Jones Bales, PT, DPT   [facility] [address]  [place], [place], [address] Phone: [phone]   Fax:  [phone]  Patient name: OMERE MARTI MRN: [id number] DOB: November 16, 1933

## 2020-04-18 ENCOUNTER — Ambulatory Visit: Payer: PPO

## 2020-04-18 ENCOUNTER — Other Ambulatory Visit: Payer: Self-pay

## 2020-04-18 DIAGNOSIS — R2689 Other abnormalities of gait and mobility: Secondary | ICD-10-CM

## 2020-04-18 DIAGNOSIS — H8113 Benign paroxysmal vertigo, bilateral: Secondary | ICD-10-CM

## 2020-04-18 DIAGNOSIS — R262 Difficulty in walking, not elsewhere classified: Secondary | ICD-10-CM

## 2020-04-18 DIAGNOSIS — R2681 Unsteadiness on feet: Secondary | ICD-10-CM

## 2020-04-19 ENCOUNTER — Telehealth: Payer: Self-pay

## 2020-04-19 NOTE — Therapy (Signed)
Outpatient Physical Therapy Treatment Patient Name: [name] MRN: [id number] DOB:1933/03/04, 87 y.o., male Today's Date: 04/20/2020  PCP: [name], MD Referring [place]ier: [name], MD    PT End of Session - 04/20/20 1017    Visit Number 5    Number of Visits 13    Date for PT Re-Evaluation 06/01/20    PT Start Time 1017    PT Stop Time 1058    PT Time Calculation (min) 41 min    Equipment Utilized During Treatment Gait belt    Activity Tolerance Patient tolerated treatment well    Behavior During Therapy WFL for tasks assessed/performed            Past Medical History:  Diagnosis Date  . Acute blood loss anemia   . Acute upper respiratory infections of unspecified site 11/04/2012  . AKI (acute kidney injury)   . Benign prostatic hyperplasia   . CAD (coronary artery disease)    s/p cypher DES to pLAD 6/08; normal LVF;  ETT-Myoview 2009: no ischemia   . Clavicle fracture 08/10/2019  . Closed displaced fracture of phalanx of left thumb, sequela 08/10/2019  . Coronary atherosclerosis of native coronary artery 03/08/2008  . Eosinophilia   . Essential hypertension   . Fracture of multiple ribs with pain 08/10/2019  . History of multiple falls   . History of SAH (subarachnoid hemorrhage) 07/14/2019  . Hyperlipidemia type IIB / III 03/08/2008  . Major neurocognitive disorder due to possible Alzheimer's disease, without behavioral disturbance 02/29/2020  . MI (myocardial infarction)   . 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 Nocturnal leg cramps 11/23/2012  . Orthostatic hypotension 03/08/2008  . Pain in joint, shoulder region 11/23/2012  . Stage 3b chronic kidney disease   . Thrombocytopenia   . Trigeminal neuralgia   . Type 2 diabetes mellitus    Past Surgical History:  Procedure Laterality Date  . APPENDECTOMY    . CARDIAC CATHETERIZATION  07/30/2006   CORONARY ANGIOPLASTY WITH STENT  PLACEMENT  . CARDIAC CATHETERIZATION    . EXPLORATORY LAPAROTOMY     age 8  . EXPLORATORY LAPAROTOMY    . IR THORACENTESIS ASP PLEURAL SPACE W/IMG GUIDE  10/05/2019   Patient Active Problem List   Diagnosis Date Noted  . Dizziness 03/08/2020  . Major neurocognitive disorder due to possible Alzheimer's disease, without behavioral disturbance 02/29/2020  . CAD (coronary artery disease)   . MI (myocardial infarction)   . Trigeminal neuralgia   . 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 Multiple trauma   . Benign prostatic hyperplasia   . Essential hypertension   . Stage 3b chronic kidney disease ([place])   . Thrombocytopenia   . History of multiple falls 07/14/2019  . History of SAH (subarachnoid hemorrhage) 07/14/2019  . Type 2 diabetes mellitus   . Eosinophilia   . Nocturnal leg cramps 11/23/2012  . Pain in joint, shoulder region 11/23/2012  . Hematoma 11/04/2012  . Hyperlipidemia type IIB / III 03/08/2008  . Orthostatic hypotension 03/08/2008  . Coronary atherosclerosis of native coronary artery 03/08/2008     SUBJECTIVE: MRI results come back good just normal degenerative changes. Patient reports did have some nausea this morning, but denies spinning sensation. Did try stairs this morning and it went well.   Pain: is patient experiencing pain? No.   OBJECTIVE:  Right Sidelying: R Upbeating Nystagmus; Duration: 5 seconds. Mild nauseous, very  low intensity noted. With reassesment after treatment no nystagmus present during test, but did have nystagmus present with rising to sitting position.  Left Sidelying: None; Duration: 0 seconds Right Roll Test: mild nystagmus with R rotary component noted.  Left Roll Test: None; Duration: 0 seconds    Vestibular Treatment:   Canalith Repositioning: Completed Semont x 2 reps. Vibration applied to R mastoid on last rep. Extended rest breaks required between treament due to intermittent nauseous symptoms. Patient  did have one episode of almost vomitting, requiring extended rest break in lobby prior to leaving session.        Patient Education: Education details: continued education on allowing for time between transitions to allow for symptoms to settle prior to standing/walking. Continue to use AD.  Person educated: Patient and Spouse Education method: Explanation Education comprehension: verbalized understanding   Goals: Goals reviewed with patient? Yes                          SHORT TERM GOALS:   STG Name Target Date Goal status  1 Pt will report <1 episodes of dizziness in a week to improve dizziness Comments: pt reports 1-2 dizziness episodes 05/04/20 INITIAL  2 Pt will demonstrate improved safety awareness by counting to 5 when changing positions to reduce fall frequency Comments:  05/04/20 INITIAL    LONG TERM GOALS:    LTG Name Target Date Goal status  1 Pt will report no falls or near falls in 3-4 consecutive weeks to reduce fall risk and improve safety Comments:  06/01/20 INITIAL  2 Pt will demo at least 5 points of change in Functional Gait Index to improve functional balance Comments: 18/30 06/01/20 INITIAL  3 Pt will report no symptoms of dizziness in 3 consecutive weeks to improve balance and safety with position changes. Comments: Eval: 1-2x/week 06/01/20 INITIAL       Plan - 04/20/20 1102    Clinical Impression Statement Reassed BPPV with patient demonstrating reoccurence of R BPPV with R Upbeating Nystagmus with R Sidelying test. Compelted treatment x 2 with Semont. Increased nauseousness at end of session therefore no more testing completed. Continued education regarding BPPV. Will continue to assess and continue per POC.    Personal Factors and Comorbidities Age;Time since onset of injury/illness/exacerbation;Past/Current Experience;Comorbidity 2    Comorbidities DM, CAD, HTN, Alzheimer's Disease    Examination-Activity Limitations Bed Mobility;Transfers     Examination-Participation Restrictions Cleaning;Community Activity;Meal Prep;Medication Management;Yard Work    [employer] officer Evolving/Moderate complexity    Rehab Potential Fair    PT Frequency 2x / week    PT Duration 8 weeks    PT Treatment/Interventions ADLs/Self Care Home Management;Canalith Repostioning;Cryotherapy;Moist Heat;Gait training;DME Instruction;Stair training;Functional mobility training;Therapeutic activities;Therapeutic exercise;Balance training;Neuromuscular re-education;Cognitive remediation;Patient/family education;Manual techniques;Passive range of motion;Dry needling;Vestibular    PT Next Visit Plan Reassess for BPPV, had reoccurence of R Posterior Canal Canalithiasis. Continue balance + review HEP if BPPV stays cleared.    Consulted and Agree with Plan of Care Patient;Family member/caregiver    Family Member Consulted Wife          Therapy Diagnosis: Unsteadiness on feet  BPPV (benign paroxysmal positional vertigo), bilateral  Other abnormalities of gait and mobility  Difficulty in walking, not elsewhere classified  Referring Diagnosis:R29.6 (ICD-10-CM) - Frequent falls Visit Diagnosis:R26.89 Other abnormalities of gait and mobility; Unsteadiness of feet R26.81; difficulty in walking R26.2; H81.13 BPPV  Jones Bales, PT, DPT   [facility] [address] [place], [place], [address] Phone: [phone]   Fax:  [phone]  Patient name: [name] MRN: [id number] DOB: [birth year]

## 2020-04-19 NOTE — Telephone Encounter (Signed)
-----   Message from Cameron Sprang, MD sent at 04/19/2020 12:45 PM EDT ----- Pls let patient/wife know the brain MRI looked fine, no evidence of tumor, stroke, new bleed. It showed age-related changes. The cervical spine MRI showed arthritis and degenerative disc disease with spinal stenosis. Continue working with physical therapy, thanks

## 2020-04-19 NOTE — Telephone Encounter (Signed)
Spoke with pt wife informed her that MRI looked fine, no evidence of tumor, stroke, new bleed. It showed age-related changes. The cervical spine MRI showed arthritis and degenerative disc disease with spinal stenosis. Continue working with physical therapy. Transferred to the front to get scheduled for follow up

## 2020-04-20 ENCOUNTER — Ambulatory Visit: Payer: PPO

## 2020-04-20 ENCOUNTER — Other Ambulatory Visit: Payer: Self-pay

## 2020-04-20 DIAGNOSIS — R2689 Other abnormalities of gait and mobility: Secondary | ICD-10-CM | POA: Diagnosis not present

## 2020-04-20 DIAGNOSIS — R2681 Unsteadiness on feet: Secondary | ICD-10-CM

## 2020-04-20 DIAGNOSIS — R262 Difficulty in walking, not elsewhere classified: Secondary | ICD-10-CM

## 2020-04-20 DIAGNOSIS — H8113 Benign paroxysmal vertigo, bilateral: Secondary | ICD-10-CM

## 2020-04-25 ENCOUNTER — Ambulatory Visit: Payer: PPO | Admitting: Physical Therapy

## 2020-04-25 ENCOUNTER — Other Ambulatory Visit: Payer: Self-pay

## 2020-04-25 ENCOUNTER — Encounter: Payer: Self-pay | Admitting: Physical Therapy

## 2020-04-25 DIAGNOSIS — R2681 Unsteadiness on feet: Secondary | ICD-10-CM

## 2020-04-25 DIAGNOSIS — R2689 Other abnormalities of gait and mobility: Secondary | ICD-10-CM | POA: Diagnosis not present

## 2020-04-25 NOTE — Therapy (Signed)
[facility] [address] [place], [place], [address] Phone: [phone]   Fax:  [phone]  Physical Therapy Treatment  Patient Details  Name: [name] MRN: [id number] Date of Birth: 05-15-1933 Referring Provider (PT): Reesa Chew PA-C   Encounter Date: 04/25/2020   PT End of Session - 04/25/20 1957    Visit Number 6    Number of Visits 13    Date for PT Re-Evaluation 06/01/20    PT Start Time 0933    PT Stop Time 1017    PT Time Calculation (min) 44 min    Equipment Utilized During Treatment Other (comment)   RW trialed   Activity Tolerance Patient tolerated treatment well    Behavior During Therapy [facility] for tasks assessed/performed           Past Medical History:  Diagnosis Date  . Acute blood loss anemia   . Acute upper respiratory infections of unspecified site 11/04/2012  . AKI (acute kidney injury)   . Benign prostatic hyperplasia   . CAD (coronary artery disease)    s/p cypher DES to pLAD 6/08; normal LVF;  ETT-Myoview 2009: no ischemia   . Clavicle fracture 08/10/2019  . Closed displaced fracture of phalanx of left thumb, sequela 08/10/2019  . Coronary atherosclerosis of native coronary artery 03/08/2008  . Eosinophilia   . Essential hypertension   . Fracture of multiple ribs with pain 08/10/2019  . History of multiple falls   . History of SAH (subarachnoid hemorrhage) 07/14/2019  . Hyperlipidemia type IIB / III 03/08/2008  . Major neurocognitive disorder due to possible Alzheimer's disease, without behavioral disturbance 02/29/2020  . MI (myocardial infarction)   . 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 Nocturnal leg cramps 11/23/2012  . Orthostatic hypotension 03/08/2008  . Pain in joint, shoulder region 11/23/2012  . Stage 3b chronic kidney disease   . Thrombocytopenia   . Trigeminal neuralgia   . Type 2 diabetes mellitus      Past Surgical History:  Procedure Laterality Date  . APPENDECTOMY    . CARDIAC CATHETERIZATION  07/30/2006   CORONARY ANGIOPLASTY WITH STENT PLACEMENT  . CARDIAC CATHETERIZATION    . EXPLORATORY LAPAROTOMY     age 87  . EXPLORATORY LAPAROTOMY    . IR THORACENTESIS ASP PLEURAL SPACE W/IMG GUIDE  10/05/2019    There were no vitals filed for this visit.   Subjective Assessment - 04/25/20 0935    Subjective Pt reports dizziness is much improved:  Pt reports he has been using [facility] since hospitalization; wife questions if RW is needed - says Dr. Delice Lesch suggested RW when he was so unsteady    Patient is accompained by: Family member   wife   Currently in Pain? No/denies                   Vestibular Assessment - 04/25/20 0001      Positional Testing   Dix-Hallpike Dix-Hallpike Right;Dix-Hallpike Left    Sidelying Test Sidelying Right;Sidelying Left      Dix-Hallpike Right   Dix-Hallpike Right Duration none    Dix-Hallpike Right Symptoms No nystagmus      Dix-Hallpike Left   Dix-Hallpike Left Duration none    Dix-Hallpike Left Symptoms No nystagmus      Sidelying Right   Sidelying Right Duration no  [place] Adult PT Treatment/Exercise - 04/25/20 0001      Ambulation/Gait   Ambulation/Gait Yes    Ambulation/Gait Assistance 5: Supervision    Ambulation Distance (Feet) 115 Feet    Assistive device Rolling walker    Gait Pattern Step-through pattern    Ambulation Surface Level;Indoor               Balance Exercises - 04/25/20 0001      Balance Exercises: Standing   Other Standing Exercises pt performed sit to stand x 5 reps without UE support;  standing forward, back and side kicks 10 reps each with UE support; standing with feet together EO and then with EC for 10 secs;  partial tandem stance 30 secs each position with UE support; marching in place;  marching forward and backward 10' x 2 reps along counter with CGA ;  SLS 10 secs  with UE support 2 reps on each leg             PT Education - 04/25/20 1956    Education Details pt instructed in balance HEP - see pt instructions    Person(s) Educated Patient;Spouse    Methods Explanation;Demonstration;Handout    Comprehension Verbalized understanding;Returned demonstration                      Plan - 04/25/20 0938    Clinical Impression Statement Pt had no signs of BPPV and reported no vertigo with any positional testing.  BPPV appears to be resolved at this time.  RW was trialed for assistance with ambulation as pt's wife reported this device had been recommended by Dr. Delice Lesch and pt reported he did not feel steady with use of LBQC.  RW was beneficial in providing stability with gait and pt reported he felt safer with use of RW than with LBQC.  Will request order from Dr. Dennard Schaumann for RW to have to use at night when pt is not as steady with ambulation.    Personal Factors and Comorbidities Age;Time since onset of injury/illness/exacerbation;Past/Current Experience;Comorbidity 2    Comorbidities DM, CAD, HTN, Alzheimer's Disease    Examination-Activity Limitations Bed Mobility;Transfers    Examination-Participation Restrictions Cleaning;Community Activity;Meal Prep;Medication Management;Yard Work    [employer] officer Evolving/Moderate complexity    Rehab Potential Fair    PT Frequency 2x / week    PT Duration 8 weeks    PT Treatment/Interventions ADLs/Self Care Home Management;Canalith Repostioning;Cryotherapy;Moist Heat;Gait training;DME Instruction;Stair training;Functional mobility training;Therapeutic activities;Therapeutic exercise;Balance training;Neuromuscular re-education;Cognitive remediation;Patient/family education;Manual techniques;Passive range of motion;Dry needling;Vestibular    PT Next Visit Plan Continue balance - check balance HEP issued on 04-25-20    PT Home Exercise Plan Balance HEP    Consulted and Agree with Plan of  Care Patient;Family member/caregiver    Family Member Consulted Wife           Patient will benefit from skilled therapeutic intervention in order to improve the following deficits and impairments:  Abnormal gait,Decreased activity tolerance,Decreased balance,Decreased cognition,Decreased safety awareness,Dizziness,Difficulty walking,Decreased range of motion,Decreased mobility,Postural dysfunction,Pain  Visit Diagnosis: Unsteadiness on feet  Other abnormalities of gait and mobility     Problem List Patient Active Problem List   Diagnosis Date Noted  . Dizziness 03/08/2020  . Major neurocognitive disorder due to possible Alzheimer's disease, without behavioral disturbance 02/29/2020  . CAD (coronary artery disease)   . MI (myocardial infarction)   . Trigeminal neuralgia   . 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  Multiple trauma   . Benign prostatic hyperplasia   . Essential hypertension   . Stage 3b chronic kidney disease ([place])   . Thrombocytopenia   . History of multiple falls 07/14/2019  . History of SAH (subarachnoid hemorrhage) 07/14/2019  . Type 2 diabetes mellitus   . Eosinophilia   . Nocturnal leg cramps 11/23/2012  . Pain in joint, shoulder region 11/23/2012  . Hematoma 11/04/2012  . Hyperlipidemia type IIB / III 03/08/2008  . Orthostatic hypotension 03/08/2008  . Coronary atherosclerosis of native coronary artery 03/08/2008    Alda Lea, PT 04/25/2020, 8:10 PM  [facility] [address] [place], [place], [address] Phone: [phone]   Fax:  [phone]  Name: BURNIS HALLING MRN: [id number] Date of Birth: [birth year]

## 2020-04-25 NOTE — Patient Instructions (Signed)
Standing Marching   Using a chair if necessary, march in place. Repeat 10 times. Do 1 sessions per day.  http://gt2.exer.us/344    Hip Backward Kick - ALTERNATE KICKS   Using a chair for balance, keep legs shoulder width apart and toes pointed for- ward. Slowly extend one leg back, keeping knee straight. Do not lean forward. Repeat with other leg. Repeat 10 times. Do 1 sessions per day.  http://gt2.exer.us/340     Hip Side Kick   Holding a chair for balance, keep legs shoulder width apart and toes pointed forward. Swing a leg out to side, keeping knee straight. Do not lean. Repeat using other leg. Repeat 10 times. Do 1 sessions per day.  FORWARD KICKS - ALTERNATE LEGS - 10 REPS - HOLD ONTO COUNTER AS NEEDED  Feet Heel-Toe "Tandem", Varied Arm Positions - Eyes Open- PARTIAL HEEL TO TOE    With eyes open, right foot directly in front of the other, arms out, look straight ahead at a stationary object. Hold 20-30 seconds. Repeat 2 times per session. Do 1 sessions per day. SWITCH FOOT POSITIONS - PLACE EACH FOOT IN FRONT FOR 1 REP  Copyright  VHI. All rights reserved.       Standing On One Leg Without Support - HOLD AS NEEDED .  Stand on one leg in neutral spine without support. Hold 10 seconds. Repeat on other leg. Do 1 repetitions, 1 set. DO 2-3X A DAY  http://bt.exer.us/36   Copyright  VHI. All rights reserved.       AMBULATION: Walk Backward   Walk backward. Take large steps, do not drag feet. 1 reps per set, 1 sets per day, 5 days per week Use assistive device. HOLD ONTO COUNTER AS NEEDED .      Braiding   Move to side: 1) cross right leg in front of left, 2) bring back leg out to side, then 3) cross right leg behind left, 4) bring left leg out to side. Continue sequence in same direction. Reverse sequence, moving in opposite direction. Repeat sequence 1 times per session. Do 1 sessions per day. 

## 2020-04-27 ENCOUNTER — Telehealth: Payer: Self-pay

## 2020-04-27 ENCOUNTER — Encounter: Payer: PPO | Admitting: Neurology

## 2020-04-27 NOTE — Telephone Encounter (Signed)
Pls let wife know I got her MyChart message. Would go back to 5mg  since he is having problems since the increase. I have an opening on Friday at 11:30am so we can go over their questions further. If they can come, pls let front know, Thanks

## 2020-04-27 NOTE — Telephone Encounter (Signed)
Called no answer

## 2020-04-27 NOTE — Telephone Encounter (Signed)
Pt wife called stated that Pt PCP increase Aricept from 5mg  to 10mg . They are asking if they should increase it?  She also had other questions she was called to ask her other questions no answer voice mail left to call the office back.

## 2020-04-28 ENCOUNTER — Telehealth: Payer: Self-pay | Admitting: Physical Therapy

## 2020-04-28 DIAGNOSIS — H6123 Impacted cerumen, bilateral: Secondary | ICD-10-CM | POA: Diagnosis not present

## 2020-04-28 NOTE — Telephone Encounter (Signed)
Pt wife informed that DrAquino would like for him to go back to 5 mg of Aricept from the 10 mg because he is having problems

## 2020-04-28 NOTE — Telephone Encounter (Signed)
Dr. Dennard Schaumann, [name] is receiving OP PT to address dizziness, gait and balance dysfunction.  He is improving but we feel he would benefit from a rolling walker to increase safety and stability with ambulation, especially at night when is more unsteady than he is in the daytime.  If you agree, would you please place and order in Epic (under Other Orders) for a rolling walker and we will assist him in obtaining this device.  Thank you, Guido Sander, PT [phone]

## 2020-04-29 ENCOUNTER — Other Ambulatory Visit: Payer: Self-pay | Admitting: Family Medicine

## 2020-04-29 ENCOUNTER — Ambulatory Visit: Payer: PPO | Admitting: Neurology

## 2020-04-29 ENCOUNTER — Encounter: Payer: Self-pay | Admitting: Neurology

## 2020-04-29 ENCOUNTER — Other Ambulatory Visit: Payer: Self-pay

## 2020-04-29 VITALS — BP 172/69 | HR 73 | Ht 67.5 in | Wt 179.2 lb

## 2020-04-29 DIAGNOSIS — S069X1S Unspecified intracranial injury with loss of consciousness of 30 minutes or less, sequela: Secondary | ICD-10-CM

## 2020-04-29 DIAGNOSIS — M4802 Spinal stenosis, cervical region: Secondary | ICD-10-CM

## 2020-04-29 DIAGNOSIS — F0390 Unspecified dementia without behavioral disturbance: Secondary | ICD-10-CM

## 2020-04-29 MED ORDER — RIVASTIGMINE TARTRATE 1.5 MG PO CAPS: ORAL_CAPSULE | ORAL | 1 refills | Status: DC

## 2020-04-29 NOTE — Patient Instructions (Addendum)
1. Stop the Donepezil. Once the nausea is improved, start the Rivastigmine 1.5mg  twice a day. Contact our office for an update in a month, if no side effects, we will increase to 3mg  twice a day  2. Continue physical therapy  3. Follow-up as scheduled in August, call for any changes   FALL PRECAUTIONS: Be cautious when walking. Scan the area for obstacles that may increase the risk of trips and falls. When getting up in the mornings, sit up at the edge of the bed for a few minutes before getting out of bed. Consider elevating the bed at the head end to avoid drop of blood pressure when getting up. Walk always in a well-lit room (use night lights in the walls). Avoid area rugs or power cords from appliances in the middle of the walkways. Use a walker or a cane if necessary and consider physical therapy for balance exercise. Get your eyesight checked regularly.  HOME SAFETY: Consider the safety of the kitchen when operating appliances like stoves, microwave oven, and blender. Consider having supervision and share cooking responsibilities until no longer able to participate in those. Accidents with firearms and other hazards in the house should be identified and addressed as well.  ABILITY TO BE LEFT ALONE: If patient is unable to contact 911 operator, consider using LifeLine, or when the need is there, arrange for someone to stay with patients. Smoking is a fire hazard, consider supervision or cessation. Risk of wandering should be assessed by caregiver and if detected at any point, supervision and safe proof recommendations should be instituted.  MEDICATION SUPERVISION: Inability to self-administer medication needs to be constantly addressed. Implement a mechanism to ensure safe administration of the medications.  RECOMMENDATIONS FOR ALL PATIENTS WITH MEMORY PROBLEMS: 1. Continue to exercise (Recommend 30 minutes of walking everyday, or 3 hours every week) 2. Increase social interactions - continue  going to [place] and enjoy social gatherings with friends and family 3. Eat healthy, avoid fried foods and eat more fruits and vegetables 4. Maintain adequate blood pressure, blood sugar, and blood cholesterol level. Reducing the risk of stroke and cardiovascular disease also helps promoting better memory. 5. Avoid stressful situations. Live a simple life and avoid aggravations. Organize your time and prepare for the next day in anticipation. 6. Sleep well, avoid any interruptions of sleep and avoid any distractions in the bedroom that may interfere with adequate sleep quality 7. Avoid sugar, avoid sweets as there is a strong link between excessive sugar intake, diabetes, and cognitive impairment The Mediterranean diet has been shown to help patients reduce the risk of progressive memory disorders and reduces cardiovascular risk. This includes eating fish, eat fruits and green leafy vegetables, nuts like almonds and hazelnuts, walnuts, and also use olive oil. Avoid fast foods and fried foods as much as possible. Avoid sweets and sugar as sugar use has been linked to worsening of memory function.  There is always a concern of gradual progression of memory problems. If this is the case, then we may need to adjust level of care according to patient needs. Support, both to the patient and caregiver, should then be put into place.

## 2020-04-29 NOTE — Telephone Encounter (Signed)
I placed th order in epic.

## 2020-04-29 NOTE — Progress Notes (Signed)
NEUROLOGY FOLLOW UP OFFICE NOTE  AMANDA POTE [id number] Dec 03, 1933  HISTORY OF PRESENT ILLNESS: I had the pleasure of seeing Geofrey Silliman in follow-up in the neurology clinic on 04/29/2020.  The patient was last seen a month ago for Alzheimer's disease. He is again accompanied by his wife [name] who helps supplement the history today. I personally reviewed MRI brain with and without contrast done 04/2020 which did not show any acute changes. 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 His exam on initial visit also showed hyperreflexia and fasciculations, and he was reporting neck pain. MRI cervical spine did not show any cord abnormalities, there was mild to moderate spinal stenosis and foraminal stenosis at several levels. EMG/NCV was normal, no evidence of motor neuron disease. When he initially started Donepezil 5mg  daily, he seemed to be less confused, however when dose was increased to 10mg , the prior confusion came back. He was also having nausea, vivid dreams, which continue even when they reduced dose back to 5mg  daily. The dizziness has significantly improved with vestibular therapy. He will be starting balance therapy and gets his walker next week.    History on Initial Assessment 03/10/2020: This is a pleasant 87 year old right-handed man with a history of hypertension, CAD, diabetes, hyperlipidemia, presenting for management of Alzheimer's disease. He underwent Neuropsychological testing in last month with findings concerning for AD. Records were reviewed. He had an unwitnessed fall in June 2021 and sustained several fractures and a subarachnoid hemorrhage on the left that did not require surgical intervention. He started reporting forgetfulness at that time. He was discharged home but back in the hospital a month later with headache, low back pain and fever due to UTI. Head CT  showed a left hemispheric subdural hemorrhage, new from prior CT, resolved on follow-up CT in September 2021. He had another fall in November 2021 when he missed a step. He was back in the ER a few weeks later due to increasing confusion and forgetfulness, frequent right-sided headaches, and a clicking noise in his neck, attributed to his recent fall. He was back in the ER again in January 2022 for dizziness, worse when he moves quickly or gets up too quickly, leading to falls. 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 Vestibular therapy helped with dizziness. Neuropsychological evaluation indicated prominent impairment surrounding all aspects of learning and memory, meeting criteria for Major Neurocognitive Disorder, mild end of spectrum, etiology concerning for Alzheimer's disease. There may be a vascular component as well. Prior TBI exacerbated deficits that were already present rather than being primarily responsible for dysfunction.   They report his long-term memory is good, he can remember long passages from [place]. [name] reports some memory issues prior to the fall, for instance he would recall a story differently or he could not keep an appointment date in his mind or a prior decision they had made. He has not been driving. He manages his own pillbox with a good system, [name] checks behind him and notes he makes mistakes sometimes. He denies any difficulties managing finances, [name] has been managing them since the fall in June and notes there was a notification from the bank prior to his fall. He is fairly independent, [name] stays close during dressing and bathing mostly for safety purposes. He gets dizzy the most  when bending down. He uses a cane all the time. He states mood is not bad, "I'm not miserable." A few times he would ask their daughter where her mother or when [name]  is coming back when she is right there. He thinks the daughter who lives with them is a boy, using male pronouns for her, which is new. There have been times he thinks there is a second cat or the cat is a dog, mostly at night. He used to think they had 2 bedrooms.   He denies any headaches. He has occasional vertigo and feels like he would fall, last fall was in January. Dizziness mostly occurs when he gets up, but he has also reported it while sitting or supine. It lasts at most 2 minutes, no nausea/vomiting, vision changes. The other night he woke up and saw nothing except for 2 black rings that he described as full black moons, this has happened twice. He has left trigeminal neuralgia. He was having orthostatic hypotension initially after the fall and they had to stop PT. BP has improved but he still has occasional dizziness. No focal numbness/tingling/weakness. He has neck pain and notes a "clicking" in his neck. Cervical xray showed cervical spondylosis, trace grade 1 anterolisthesis at C2-3, C3-4, C5-6, and C6-7. He has low back pain. No bowel/bladder dysfunction. No anosmia but sometimes smells food when there is none, usually at night. He has mild hand tremors. He has occasional daytime drowsiness, sleeping on his back due to pain, however [name] notes he sounds like he is gasping for breath. He snores sometimes.    PAST MEDICAL HISTORY: Past Medical History:  Diagnosis Date  . Acute blood loss anemia   . Acute upper respiratory infections of unspecified site 11/04/2012  . AKI (acute kidney injury)   . Benign prostatic hyperplasia   . CAD (coronary artery disease)    s/p cypher DES to pLAD 6/08; normal LVF;  ETT-Myoview 2009: no ischemia   . Clavicle fracture 08/10/2019  . Closed displaced fracture of phalanx of left thumb, sequela 08/10/2019  . Coronary atherosclerosis of native coronary artery 03/08/2008  . Eosinophilia   . Essential hypertension   . Fracture of multiple ribs with pain  08/10/2019  . History of multiple falls   . History of SAH (subarachnoid hemorrhage) 07/14/2019  . Hyperlipidemia type IIB / III 03/08/2008  . Major neurocognitive disorder due to possible Alzheimer's disease, without behavioral disturbance 02/29/2020  . MI (myocardial infarction)   . 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 Nocturnal leg cramps 11/23/2012  . Orthostatic hypotension 03/08/2008  . Pain in joint, shoulder region 11/23/2012  . Stage 3b chronic kidney disease   . Thrombocytopenia   . Trigeminal neuralgia   . Type 2 diabetes mellitus     MEDICATIONS: Current Outpatient Medications on File Prior to Visit  Medication Sig Dispense Refill  . albuterol (PROAIR HFA) 108 (90 Base) MCG/ACT inhaler INHALE 2 PUFFS INTO THE LUNGS EVERY 6 HOURS AS NEEDED FOR WHEEZING OR SHORTNESS OF BREATH (Patient taking differently: Inhale 2 puffs into the lungs every 6 (six) hours as needed for wheezing or shortness of breath.) 6.7 g 0  . amLODipine (NORVASC) 5 MG tablet Take 1 tablet (5 mg total) by mouth daily. 90 tablet 3  . Apoaequorin (PREVAGEN EXTRA STRENGTH PO) Take 1 tablet by mouth daily.    Marland Kitchen aspirin EC 81 MG tablet Take 81 mg by mouth daily. Swallow  whole.    . atorvastatin (LIPITOR) 40 MG tablet Take 40 mg by mouth daily.    Marland Kitchen BREO ELLIPTA 200-25 MCG/INH AEPB INHALE 1 PUFF BY MOUTH EVERY DAY 60 each 1  . Calcium Carb-Cholecalciferol (CALCIUM 600+D3 PO) Take 1 tablet by mouth 2 (two) times daily.    . diclofenac Sodium (VOLTAREN) 1 % GEL Apply 1 application topically daily as needed (pain).    Marland Kitchen donepezil (ARICEPT) 10 MG tablet Take 1 tablet (10 mg total) by mouth at bedtime. 90 tablet 3  . doxazosin (CARDURA) 2 MG tablet TAKE 1 TABLET BY MOUTH EVERY DAY (Patient taking differently: Take 2 mg by mouth daily.) 90 tablet 3  . doxycycline (VIBRA-TABS) 100 MG tablet Take 1 tablet (100 mg total) by mouth 2 (two) times daily. 20 tablet 0  .  escitalopram (LEXAPRO) 10 MG tablet TAKE 1 TABLET BY MOUTH EVERY DAY (Patient taking differently: Take 10 mg by mouth daily.) 90 tablet 2  . ezetimibe (ZETIA) 10 MG tablet TAKE 1 TABLET BY MOUTH EVERY DAY (Patient taking differently: Take 10 mg by mouth at bedtime.) 90 tablet 3  . loratadine (CLARITIN) 10 MG tablet Take 10 mg by mouth daily as needed for allergies or rhinitis.     . Multiple Vitamin (MULTIVITAMIN WITH MINERALS) TABS tablet Take 1 tablet by mouth daily.     No current facility-administered medications on file prior to visit.    ALLERGIES: Allergies  Allergen Reactions  . Gabapentin Other (See Comments)    Visual changes and confusion- "I went blind while I was driving"    FAMILY HISTORY: Family History  Problem Relation Age of Onset  . Stomach cancer Mother   . Memory loss Mother   . Heart disease Father   . Coronary artery disease Father 11  . Heart attack Father   . Heart disease Brother   . Aortic aneurysm Brother   . Colon cancer Brother   . Aortic aneurysm Brother   . Colon cancer Brother   . Arthritis Daughter        rheumatoid  . Coronary artery disease Brother   . Esophageal cancer Neg Hx   . Rectal cancer Neg Hx     SOCIAL HISTORY: Social History   Socioeconomic History  . Marital status: Married    Spouse name: [name]  . Number of children: 1  . Years of education: 23  . Highest education level: Doctorate  Occupational History  . Occupation: retired professor    Comment: History   . Occupation: missionary  Tobacco Use  . Smoking status: Never Smoker  . Smokeless tobacco: Never Used  Vaping Use  . Vaping Use: Never used  Substance and Sexual Activity  . Alcohol use: Not Currently  . Drug use: Never  . Sexual activity: Yes    Partners: Female  Other Topics Concern  . Not on file  Social History Narrative   ** Merged History Encounter **       Lives with his wife (second marriage in 2015, first marriage ended when his wife died  alzheimer's disease). Since his remarriage, his adult daughter doesn't speak with him.      Right handed   Social Determinants of Health   Financial Resource Strain: Not on file  Food Insecurity: Not on file  Transportation Needs: Not on file  Physical Activity: Not on file  Stress: Not on file  Social Connections: Not on file  Intimate Partner Violence: Not on file  PHYSICAL EXAM: Vitals:   04/29/20 1103  BP: (!) 172/69  Pulse: 73  SpO2: 97%   General: No acute distress Head:  Normocephalic/atraumatic Skin/Extremities: No rash, no edema Neurological Exam: alert and awake. No aphasia or dysarthria. Fund of knowledge is appropriate.  Attention and concentration are normal.   Cranial nerves: Pupils equal, round. Extraocular movements intact with no nystagmus. Visual fields full.  No facial asymmetry.  Motor: Bulk and tone normal, muscle strength 5/5 throughout with no pronator drift. Sensation intact to cold, vibration sense. Reflexes brisk +2 throughout except for +1 ankle jerks.  Finger to nose testing intact.  Gait slightly wide-based, no ataxia.    IMPRESSION: This is a pleasant 87 yo RH man with a history of hypertension, CAD, diabetes, hyperlipidemia, with recent Neuropsychological evaluation consistent with mild Alzheimer's disease. MRI brain showed mild diffuse atrophy and chronic microvascular disease. MRI cervical spine showed mild to moderate spinal stenosis. He is having side effects on full dose Donepezil, we discussed switching to Rivastigmine 1.5mg  BID, side effects and expectations discussed. They will update our office in a month, if no side effects, we will increase to 3mg  BID. Continue physical therapy. We discussed prognosis and continued supervision, monitor safety. He does not drive. We again discussed the importance of control of vascular risk factors, physical exercise, and brain stimulation exercises for brain health. Follow-up as scheduled in August, call for  any changes.    Thank you for allowing me to participate in his care.  Please do not hesitate to call for any questions or concerns.   Ellouise Newer, M.D.   CC: Dr. Dennard Schaumann

## 2020-05-02 ENCOUNTER — Ambulatory Visit: Payer: PPO | Admitting: Physical Therapy

## 2020-05-02 ENCOUNTER — Encounter: Payer: Self-pay | Admitting: Family Medicine

## 2020-05-02 ENCOUNTER — Encounter: Payer: Self-pay | Admitting: Physical Therapy

## 2020-05-02 ENCOUNTER — Other Ambulatory Visit: Payer: Self-pay

## 2020-05-02 DIAGNOSIS — R2689 Other abnormalities of gait and mobility: Secondary | ICD-10-CM

## 2020-05-02 DIAGNOSIS — R2681 Unsteadiness on feet: Secondary | ICD-10-CM

## 2020-05-02 NOTE — Therapy (Signed)
[facility] [address] [place], [place], [address] Phone: [phone]   Fax:  [phone]  Physical Therapy Treatment  Patient Details  Name: [name] MRN: [id number] Date of Birth: [birth year] Referring Provider (PT): Reesa Chew PA-C   Encounter Date: 05/02/2020   PT End of Session - 05/02/20 2138    Visit Number 7    Number of Visits 13    Date for PT Re-Evaluation 06/01/20    PT Start Time 0932    PT Stop Time 1016    PT Time Calculation (min) 44 min    Equipment Utilized During Treatment Other (comment)   RW trialed   Activity Tolerance Patient tolerated treatment well    Behavior During Therapy [facility] for tasks assessed/performed           Past Medical History:  Diagnosis Date  . Acute blood loss anemia   . Acute upper respiratory infections of unspecified site 11/04/2012  . AKI (acute kidney injury)   . Benign prostatic hyperplasia   . CAD (coronary artery disease)    s/p cypher DES to pLAD 6/08; normal LVF;  ETT-Myoview 2009: no ischemia   . Clavicle fracture 08/10/2019  . Closed displaced fracture of phalanx of left thumb, sequela 08/10/2019  . Coronary atherosclerosis of native coronary artery 03/08/2008  . Eosinophilia   . Essential hypertension   . Fracture of multiple ribs with pain 08/10/2019  . History of multiple falls   . History of SAH (subarachnoid hemorrhage) 07/14/2019  . Hyperlipidemia type IIB / III 03/08/2008  . Major neurocognitive disorder due to possible Alzheimer's disease, without behavioral disturbance 02/29/2020  . MI (myocardial infarction)   . 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 Nocturnal leg cramps 11/23/2012  . Orthostatic hypotension 03/08/2008  . Pain in joint, shoulder region 11/23/2012  . Stage 3b chronic kidney disease   . Thrombocytopenia   . Trigeminal neuralgia   . Type 2 diabetes mellitus      Past Surgical History:  Procedure Laterality Date  . APPENDECTOMY    . CARDIAC CATHETERIZATION  07/30/2006   CORONARY ANGIOPLASTY WITH STENT PLACEMENT  . CARDIAC CATHETERIZATION    . EXPLORATORY LAPAROTOMY     age 30  . EXPLORATORY LAPAROTOMY    . IR THORACENTESIS ASP PLEURAL SPACE W/IMG GUIDE  10/05/2019    There were no vitals filed for this visit.   Subjective Assessment - 05/02/20 1145    Subjective Pt reports he had a terrible week last week - had upset stomach due to taking full dosage of Donezepil; wife states he saw Dr. Delice Lesch on Friday and she switched medications - pt states he is starting to feel better. He inquires about the rolling walker    Patient is accompained by: Family member   wife   Currently in Pain? No/denies                             [facility] Adult PT Treatment/Exercise - 05/02/20 0955      Ambulation/Gait   Ambulation/Gait Yes    Ambulation/Gait Assistance 4: Min guard    Ambulation Distance (Feet) 400 Feet    Assistive device Rollator    Gait Pattern Step-through pattern    Ambulation Surface Level;Unlevel;Outdoor;Paved;Indoor    Curb 4: Min assist    Curb Details (indicate cue type and reason) pt instructed in how to  negotiate curb with rollator without lifting it; instructions to lock brakes with descension and ascension    Gait Comments Pt gait trained 115' with standard RW indoors for comparison of RW with rollator - pt prefers rollator               Balance Exercises - 05/02/20 0001      Balance Exercises: Standing   Marching Solid surface;Static   inside // bars with CGA   Sit to Stand Foam/compliant surface   5 reps without UE support from mat   Other Standing Exercises Pt performed alternating forward, backward and side kicks 5 reps each leg inside // bars but without UE support    Other Standing Exercises Comments Pt performed standing on AIrex inside // bars - 10 secs with EC no UE support with CGA;  EO -  horizontal and vertical head turns 5 reps each direction;  pt performed alternate stepping down from Airex to floor 5 reps each foot and then stepping backward from Airex to floor with minimal UE support             PT Education - 05/02/20 2136    Education Details pt and wife instructed in correct use of rollator for assistance with ambulation on even and uneven surfaces, including posture (staying close to walker for more erect posture); reviewed balance HEP    Person(s) Educated Patient;Spouse    Methods Explanation;Demonstration    Comprehension Verbalized understanding;Returned demonstration                      Plan - 05/02/20 2138    Clinical Impression Statement Pt continues to report no dizziness (wife states dizziness may have been side effect of medication); pt able to safely use rollator for assistance with ambulation but needs frequent, consistent cues to slow down as he has tendency to amb. and move too quickly for safety.  Pt's balance is improving but needs cues to perform exercises slowly to promote SLS on each leg.  Cont with POC.    PT Next Visit Plan Cont to assess gait with rollator; cont balance training    Consulted and Agree with Plan of Care Patient;Family member/caregiver    Family Member Consulted Wife           Patient will benefit from skilled therapeutic intervention in order to improve the following deficits and impairments:     Visit Diagnosis: Unsteadiness on feet  Other abnormalities of gait and mobility     Problem List Patient Active Problem List   Diagnosis Date Noted  . Dizziness 03/08/2020  . Major neurocognitive disorder due to possible Alzheimer's disease, without behavioral disturbance 02/29/2020  . CAD (coronary artery disease)   . MI (myocardial infarction)   . Trigeminal neuralgia   . 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 Multiple trauma   . Benign prostatic hyperplasia   . Essential  hypertension   . Stage 3b chronic kidney disease ([place])   . Thrombocytopenia   . History of multiple falls 07/14/2019  . History of SAH (subarachnoid hemorrhage) 07/14/2019  . Type 2 diabetes mellitus   . Eosinophilia   . Nocturnal leg cramps 11/23/2012  . Pain in joint, shoulder region 11/23/2012  . Hematoma 11/04/2012  . Hyperlipidemia type IIB / III 03/08/2008  . Orthostatic hypotension 03/08/2008  . Coronary atherosclerosis of native coronary artery 03/08/2008    Alda Lea, PT 05/02/2020, 9:49 PM  [facility] [address] [place], [place], [address] Phone: [phone]   Fax:  [phone]  Name: [name] MRN: [id number] Date of Birth: May 07, 1933

## 2020-05-04 ENCOUNTER — Other Ambulatory Visit: Payer: Self-pay

## 2020-05-04 ENCOUNTER — Ambulatory Visit: Payer: PPO

## 2020-05-04 DIAGNOSIS — R262 Difficulty in walking, not elsewhere classified: Secondary | ICD-10-CM

## 2020-05-04 DIAGNOSIS — R2689 Other abnormalities of gait and mobility: Secondary | ICD-10-CM | POA: Diagnosis not present

## 2020-05-04 DIAGNOSIS — M6281 Muscle weakness (generalized): Secondary | ICD-10-CM

## 2020-05-04 DIAGNOSIS — H8113 Benign paroxysmal vertigo, bilateral: Secondary | ICD-10-CM

## 2020-05-04 DIAGNOSIS — R2681 Unsteadiness on feet: Secondary | ICD-10-CM

## 2020-05-04 NOTE — Therapy (Signed)
Outpatient Physical Therapy Treatment Patient Name: [name] MRN: [id number] DOB:09/17/1933, 87 y.o., male Today's Date: 05/04/2020  PCP: [name], MD Referring Provier: [name], MD  Therapy Diagnosis: Unsteadiness on feet  BPPV (benign paroxysmal positional vertigo), bilateral  Other abnormalities of gait and mobility  Difficulty in walking, not elsewhere classified  Referring Diagnosis:R29.6 (ICD-10-CM) - Frequent falls Visit Diagnosis:R26.89 Other abnormalities of gait and mobility; Unsteadiness of feet R26.81; difficulty in walking R26.2; H81.13 BPPV    PT End of Session - 05/04/20 0854    Visit Number 8    Number of Visits 13    Date for PT Re-Evaluation 06/01/20    PT Start Time 0845    PT Stop Time 0930    PT Time Calculation (min) 45 min    Equipment Utilized During Treatment Other (comment)   RW trialed   Activity Tolerance Patient tolerated treatment well    Behavior During Therapy [facility] for tasks assessed/performed            Past Medical History:  Diagnosis Date  . Acute blood loss anemia   . Acute upper respiratory infections of unspecified site 11/04/2012  . AKI (acute kidney injury)   . Benign prostatic hyperplasia   . CAD (coronary artery disease)    s/p cypher DES to pLAD 6/08; normal LVF;  ETT-Myoview 2009: no ischemia   . Clavicle fracture 08/10/2019  . Closed displaced fracture of phalanx of left thumb, sequela 08/10/2019  . Coronary atherosclerosis of native coronary artery 03/08/2008  . Eosinophilia   . Essential hypertension   . Fracture of multiple ribs with pain 08/10/2019  . History of multiple falls   . History of SAH (subarachnoid hemorrhage) 07/14/2019  . Hyperlipidemia type IIB / III 03/08/2008  . Major neurocognitive disorder due to possible Alzheimer's disease, without behavioral disturbance 02/29/2020  . MI (myocardial infarction)   . 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 Nocturnal leg cramps 11/23/2012  . Orthostatic hypotension 03/08/2008  . Pain in joint, shoulder region 11/23/2012  . Stage 3b chronic kidney disease   . Thrombocytopenia   . Trigeminal neuralgia   . Type 2 diabetes mellitus    Past Surgical History:  Procedure Laterality Date  . APPENDECTOMY    . CARDIAC CATHETERIZATION  07/30/2006   CORONARY ANGIOPLASTY WITH STENT PLACEMENT  . CARDIAC CATHETERIZATION    . EXPLORATORY LAPAROTOMY     age 25  . EXPLORATORY LAPAROTOMY    . IR THORACENTESIS ASP PLEURAL SPACE W/IMG GUIDE  10/05/2019   Patient Active Problem List   Diagnosis Date Noted  . Dizziness 03/08/2020  . Major neurocognitive disorder due to possible Alzheimer's disease, without behavioral disturbance 02/29/2020  . CAD (coronary artery disease)   . MI (myocardial infarction)   . Trigeminal neuralgia   . 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 Multiple trauma   . Benign prostatic hyperplasia   . Essential hypertension   . Stage 3b chronic kidney disease ([place])   . Thrombocytopenia   . History of multiple falls 07/14/2019  . History of SAH (subarachnoid hemorrhage) 07/14/2019  . Type 2 diabetes mellitus   . Eosinophilia   . Nocturnal leg cramps 11/23/2012  . Pain in joint, shoulder region 11/23/2012  . Hematoma 11/04/2012  . Hyperlipidemia type IIB / III 03/08/2008  . Orthostatic hypotension 03/08/2008  . Coronary atherosclerosis of native coronary artery 03/08/2008  SUBJECTIVE: Pt is feeling better with balnace for last couple of days.  Pain: is patient experiencing pain? No.   OBJECTIVE:  Sit to stand with 15lb KB: 2 x 10 Standing dead lift: 15lbs from 6" box: 2 x 5 -  No report of dizziness with bending Resisted walking: black sport cord: fwd and bwd: 10 reps each Walking 420' around gym with horizontal and vertical head turns, fast and slow walking etc. With CGA    Patient Education: Education details:  Educated on gradual walking program with rollator. Starting with 15 min and progressing it to 30 min. Pt discussed [employer] membership with walking track, swimming, gym etc. Wife educated on bringing 2 ww for proper height adjustment. standing/walking. Continue to use AD.  Person educated: Patient and Spouse Education method: Explanation Education comprehension: verbalized understanding   Assessment: Clinical impression:  Tolerated session well. Only mild deviation in gait noted with horizontal head turns.   Rehab potential: Fair potentially due to cognitive deficits  Clinical decision making: Evolving/moderate complexity  Evaluation complexity: Moderate   Goals: Goals reviewed with patient? Yes  SHO STRT TERM GOALS: G Name Target Date Goal status  1 Pt will report <1 episodes of dizziness in a week to improve dizziness Comments: pt reports 1-2 dizziness episodes 05/04/20 INITIAL  2 Pt will demonstrate improved safety awareness by counting to 5 when changing positions to reduce fall frequency Comments:  05/04/20 INITIAL  LONG TERM GOALS:   LTG Name Target Date Goal status  1 Pt will report no falls or near falls in 3-4 consecutive weeks to reduce fall risk and improve safety Comments:  06/01/20 INITIAL  2 Pt will demo at least 5 points of change in Functional Gait Index to improve functional balance Comments: To be tested 06/01/20 INITIAL  3 Pt will report no symptoms of dizziness in 3 consecutive weeks to improve balance and safety with position changes. Comments: Eval: 1-2x/week 06/01/20 INITIAL   PLAN: PT frequency: 2x/week  PT duration: 8 weeks  Planned Interventions: Therapeutic exercises, Therapeutic activity, Neuro Muscular re-education, Balance training, Gait training, Patient/Family education, Joint mobilization, Stair training, Vestibular training, Canalith repositioning, Visual/preceptual remediation/compensation, Spinal mobilization and Traction   Plan for  next session: Pt likes the strength training. Definitely do resisted sit to stand with him, leg press, resisted walking. Continue with functional gait and balance training.      Kerrie Pleasure, PT, DPT 05/04/20, [name]   [facility] [address] [place], [place], [address] Phone: [phone]   Fax:  [phone]  Patient name: [name] MRN: [id number] DOB: 13-May-1933

## 2020-05-07 ENCOUNTER — Other Ambulatory Visit: Payer: Self-pay | Admitting: Neurology

## 2020-05-08 NOTE — Therapy (Deleted)
Outpatient Physical Therapy Treatment Patient Name: [name] MRN: [id number] DOB:[birth year], 87 y.o., male Today's Date: 05/09/2020  PCP: [name], MD Referring Provier: [name], MD  Therapy Diagnosis: Unsteadiness on feet  BPPV (benign paroxysmal positional vertigo), bilateral  Other abnormalities of gait and mobility  Difficulty in walking, not elsewhere classified  Referring Diagnosis:R29.6 (ICD-10-CM) - Frequent falls Visit Diagnosis:R26.89 Other abnormalities of gait and mobility; Unsteadiness of feet R26.81; difficulty in walking R26.2; H81.13 BPPV    Past Medical History:  Diagnosis Date  . Acute blood loss anemia   . Acute upper respiratory infections of unspecified site 11/04/2012  . AKI (acute kidney injury)   . Benign prostatic hyperplasia   . CAD (coronary artery disease)    s/p cypher DES to pLAD 6/08; normal LVF;  ETT-Myoview 2009: no ischemia   . Clavicle fracture 08/10/2019  . Closed displaced fracture of phalanx of left thumb, sequela 08/10/2019  . Coronary atherosclerosis of native coronary artery 03/08/2008  . Eosinophilia   . Essential hypertension   . Fracture of multiple ribs with pain 08/10/2019  . History of multiple falls   . History of SAH (subarachnoid hemorrhage) 07/14/2019  . Hyperlipidemia type IIB / III 03/08/2008  . Major neurocognitive disorder due to possible Alzheimer's disease, without behavioral disturbance 02/29/2020  . MI (myocardial infarction)   . 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 Nocturnal leg cramps 11/23/2012  . Orthostatic hypotension 03/08/2008  . Pain in joint, shoulder region 11/23/2012  . Stage 3b chronic kidney disease   . Thrombocytopenia   . Trigeminal neuralgia   . Type 2 diabetes mellitus    Past Surgical History:  Procedure Laterality Date  . APPENDECTOMY    . CARDIAC CATHETERIZATION  07/30/2006   CORONARY ANGIOPLASTY WITH  STENT PLACEMENT  . CARDIAC CATHETERIZATION    . EXPLORATORY LAPAROTOMY     age 56  . EXPLORATORY LAPAROTOMY    . IR THORACENTESIS ASP PLEURAL SPACE W/IMG GUIDE  10/05/2019   Patient Active Problem List   Diagnosis Date Noted  . Dizziness 03/08/2020  . Major neurocognitive disorder due to possible Alzheimer's disease, without behavioral disturbance 02/29/2020  . CAD (coronary artery disease)   . MI (myocardial infarction)   . Trigeminal neuralgia   . 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 Multiple trauma   . Benign prostatic hyperplasia   . Essential hypertension   . Stage 3b chronic kidney disease ([place])   . Thrombocytopenia   . History of multiple falls 07/14/2019  . History of SAH (subarachnoid hemorrhage) 07/14/2019  . Type 2 diabetes mellitus   . Eosinophilia   . Nocturnal leg cramps 11/23/2012  . Pain in joint, shoulder region 11/23/2012  . Hematoma 11/04/2012  . Hyperlipidemia type IIB / III 03/08/2008  . Orthostatic hypotension 03/08/2008  . Coronary atherosclerosis of native coronary artery 03/08/2008     SUBJECTIVE:   Pain: is patient experiencing pain? No   OBJECTIVE:   Sit to stand with 15lb KB: 2 x 10 Standing dead lift: 15lbs from 6" box: 2 x 5 -  No report of dizziness with bending Resisted walking: black sport cord: fwd and bwd: 10 reps each Completed ambulation around therapy gym: approx. with addition of dynamic gait tasks added including horiz/vertical head turns, sudden stops, changes in gait speed, and full body turns. intermittent CGA required with completion.   Patient Education: Education details:  Person  educated: Patient and Spouse Education method: Explanation Education comprehension: verbalized understanding   Assessment: Clinical impression:  Today's skilled PT session included assessment of patient's progress toward STG today during session. Patient able to meet STG #2   Rehab potential: Fair potentially due  to cognitive deficits  Clinical decision making: Evolving/moderate complexity  Evaluation complexity: Moderate   Goals: Goals reviewed with patient? Yes  SHORT TERM GOALS:  STG Name Target Date Goal status  1 Pt will report <1 episodes of dizziness in a week to improve dizziness Comments: pt reports 1-2 dizziness episodes; 05/09/20:  05/04/20 INITIAL  2 Pt will demonstrate improved safety awareness by counting to 5 when changing positions to reduce fall frequency Comments: Met on 4/4, reports independence with counting between position changes  05/04/20 MET   LONG TERM GOALS:   LTG Name Target Date Goal status  1 Pt will report no falls or near falls in 3-4 consecutive weeks to reduce fall risk and improve safety Comments:  06/01/20 INITIAL  2 Pt will demo at least 5 points of change in Functional Gait Index to improve functional balance Comments: To be tested 06/01/20 INITIAL  3 Pt will report no symptoms of dizziness in 3 consecutive weeks to improve balance and safety with position changes. Comments: Eval: 1-2x/week 06/01/20 INITIAL   PLAN: PT frequency: 2x/week  PT duration: 8 weeks  Planned Interventions: Therapeutic exercises, Therapeutic activity, Neuro Muscular re-education, Balance training, Gait training, Patient/Family education, Joint mobilization, Stair training, Vestibular training, Canalith repositioning, Visual/preceptual remediation/compensation, Spinal mobilization and Traction   Plan for next session: Continued balance/standing strengthening activities. Leg Press.       Jones Bales, PT, DPT 05/04/20, [name]   [facility] [address] [place], [place], [address] Phone: [phone]   Fax:  [phone]  Patient name: [name] MRN: [id number] DOB: 1934-01-17

## 2020-05-09 ENCOUNTER — Ambulatory Visit: Payer: PPO | Attending: Family Medicine

## 2020-05-09 ENCOUNTER — Other Ambulatory Visit: Payer: Self-pay

## 2020-05-09 DIAGNOSIS — R262 Difficulty in walking, not elsewhere classified: Secondary | ICD-10-CM | POA: Diagnosis not present

## 2020-05-09 DIAGNOSIS — R2681 Unsteadiness on feet: Secondary | ICD-10-CM | POA: Diagnosis not present

## 2020-05-09 DIAGNOSIS — M6281 Muscle weakness (generalized): Secondary | ICD-10-CM | POA: Diagnosis not present

## 2020-05-09 DIAGNOSIS — H8113 Benign paroxysmal vertigo, bilateral: Secondary | ICD-10-CM | POA: Diagnosis not present

## 2020-05-09 DIAGNOSIS — R2689 Other abnormalities of gait and mobility: Secondary | ICD-10-CM

## 2020-05-09 NOTE — Therapy (Signed)
Outpatient Physical Therapy Treatment/Progress Note Patient Name: [name] MRN: [id number] DOB:05-24-1933, 87 y.o., male Today's Date: 05/09/2020  PCP: [name], MD Referring Provier: [name], MD  Therapy Diagnosis: Unsteadiness on feet  BPPV (benign paroxysmal positional vertigo), bilateral  Other abnormalities of gait and mobility  Difficulty in walking, not elsewhere classified  Referring Diagnosis:R29.6 (ICD-10-CM) - Frequent falls Visit Diagnosis:R26.89 Other abnormalities of gait and mobility; Unsteadiness of feet R26.81; difficulty in walking R26.2; H81.13 BPPV  Physical Therapy Progress Note   Dates of Reporting Period: 04/06/20 - 05/09/20  See Note below for Objective Data and Assessment of Progress/Goals.  Thank you for the referral of this patient. Guillermina [place], PT, DPT    PT End of Session - 05/09/20 1021    Visit Number 9    Number of Visits 13    Date for PT Re-Evaluation 06/01/20    Progress Note Due on Visit 19   progress note completed on 9th visit   PT Start Time 1017    PT Stop Time 1059    PT Time Calculation (min) 42 min    Equipment Utilized During Treatment Other (comment)   RW trialed   Activity Tolerance Patient tolerated treatment well    Behavior During Therapy WFL for tasks assessed/performed            Past Medical History:  Diagnosis Date  . Acute blood loss anemia   . Acute upper respiratory infections of unspecified site 11/04/2012  . AKI (acute kidney injury)   . Benign prostatic hyperplasia   . CAD (coronary artery disease)    s/p cypher DES to pLAD 6/08; normal LVF;  ETT-Myoview 2009: no ischemia   . Clavicle fracture 08/10/2019  . Closed displaced fracture of phalanx of left thumb, sequela 08/10/2019  . Coronary atherosclerosis of native coronary artery 03/08/2008  . Eosinophilia   . Essential hypertension   . Fracture of multiple ribs with pain 08/10/2019  . History of multiple falls   . History  of SAH (subarachnoid hemorrhage) 07/14/2019  . Hyperlipidemia type IIB / III 03/08/2008  . Major neurocognitive disorder due to possible Alzheimer's disease, without behavioral disturbance 02/29/2020  . MI (myocardial infarction)   . 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 Nocturnal leg cramps 11/23/2012  . Orthostatic hypotension 03/08/2008  . Pain in joint, shoulder region 11/23/2012  . Stage 3b chronic kidney disease   . Thrombocytopenia   . Trigeminal neuralgia   . Type 2 diabetes mellitus    Past Surgical History:  Procedure Laterality Date  . APPENDECTOMY    . CARDIAC CATHETERIZATION  07/30/2006   CORONARY ANGIOPLASTY WITH STENT PLACEMENT  . CARDIAC CATHETERIZATION    . EXPLORATORY LAPAROTOMY     age 37  . EXPLORATORY LAPAROTOMY    . IR THORACENTESIS ASP PLEURAL SPACE W/IMG GUIDE  10/05/2019   Patient Active Problem List   Diagnosis Date Noted  . Dizziness 03/08/2020  . Major neurocognitive disorder due to possible Alzheimer's disease, without behavioral disturbance 02/29/2020  . CAD (coronary artery disease)   . MI (myocardial infarction)   . Trigeminal neuralgia   . 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 Multiple trauma   . Benign prostatic hyperplasia   . Essential hypertension   . Stage 3b chronic kidney disease ([place])   . Thrombocytopenia   . History of multiple falls 07/14/2019  . History of SAH (subarachnoid hemorrhage)  07/14/2019  . Type 2 diabetes mellitus   . Eosinophilia   . Nocturnal leg cramps 11/23/2012  . Pain in joint, shoulder region 11/23/2012  . Hematoma 11/04/2012  . Hyperlipidemia type IIB / III 03/08/2008  . Orthostatic hypotension 03/08/2008  . Coronary atherosclerosis of native coronary artery 03/08/2008   SUBJECTIVE: Patient reports that he did have a severe spinning sensation with laying on his R side, this has been the only time that it has happened. Wife  reports that a few days ago had one minor episodes. No falls or other new changes. Patient reports that has not received rollator yet.   Pain: is patient experiencing pain? No   OBJECTIVE:  Vestibular Assesment:  Reassesed R BPPV with R Sidelying: No nystagmus/symptoms noted indicating continued resolution of R BPPV.   PT Treatment:  Completed ambulation around therapy gym: approx 350' without AD, with addition of dynamic gait tasks added including horiz/vertical head turns, sudden stops, changes in gait speed, and full body turns. intermittent CGA required with completion. Increased challenge with head turns nopted.  Completed sit <> stands with BLE placed on airex: completed x 10 reps with BUE support, then progressed to second set x 10 reps without UE support. Close CGA without UE support.  Completed Leg Press: completed 80# x 10 reps, progressed to 90# x 10 reps, PT providing verbal/tactile cues to avoid locking knees out into extension, keeping slight bend.  Completed alternating march with red theraband: completed 2 x 10 reps with BLE, verbal cues for slowed control pace with completion to further promote SLS time.   With 6" step, completed alternating toe taps without UE support x 15 reps. Intermittent CGA and touch A to chair as needed.   Patient Education: Education details: Educating to try sleeping on R side, due to resolution of BPPV to determine if reoccurrence will occur.  Person educated: Patient and Spouse Education method: Explanation Education comprehension: verbalized understanding   Assessment: Clinical impression:  Today's skilled PT session included assessment of patient's progress toward STG today during session. Patient able to meet STG #2 today demonstrating ability to count to allow for reduced symptoms with position changes. Patient has had approximately two episodes of dizziness in the last week, patient reporting overall improvement. No nystagmus/symptoms with  positional assessment today. Rest of session focused on continued strengthening/balance.   Rehab potential: Fair potentially due to cognitive deficits  Clinical decision making: Evolving/moderate complexity  Evaluation complexity: Moderate   Goals: Goals reviewed with patient? Yes  SHORT TERM GOALS:  STG Name Target Date Goal status  1 Pt will report <1 episodes of dizziness in a week to improve dizziness Comments: pt reports 1-2 dizziness episodes; 05/09/20: still continue to have 1-2 episodes of dizziness 05/04/20 On -Going  2 Pt will demonstrate improved safety awareness by counting to 5 when changing positions to reduce fall frequency Comments: Met on 4/4, reports independence with counting between position changes  05/04/20 MET   LONG TERM GOALS:   LTG Name Target Date Goal status  1 Pt will report no falls or near falls in 3-4 consecutive weeks to reduce fall risk and improve safety Comments:  06/01/20 INITIAL  2 Pt will demo at least 5 points of change in Functional Gait Index to improve functional balance Comments: To be tested 06/01/20 INITIAL  3 Pt will report no symptoms of dizziness in 3 consecutive weeks to improve balance and safety with position changes. Comments: Eval: 1-2x/week 06/01/20 INITIAL   PLAN: PT  frequency: 2x/week  PT duration: 8 weeks  Planned Interventions: Therapeutic exercises, Therapeutic activity, Neuro Muscular re-education, Balance training, Gait training, Patient/Family education, Joint mobilization, Stair training, Vestibular training, Canalith repositioning, Visual/preceptual remediation/compensation, Spinal mobilization and Traction   Plan for next session: Any update on Rollator? Reassess BPPV as needed and treat as indicated. Continued balance/standing strengthening activities. Leg Press.   Jones Bales, PT, DPT 05/09/20, [name]   [facility] [address] [place], [place], [address] Phone: [phone]   Fax:  [phone]  Patient name: [name] MRN: [id number] DOB: [birth year]

## 2020-05-11 ENCOUNTER — Ambulatory Visit: Payer: PPO | Admitting: Neurology

## 2020-05-12 NOTE — Therapy (Signed)
Outpatient Physical Therapy Treatment Patient Name: [name] MRN: [id number] DOB:[birth year], 87 y.o., male Today's Date: 05/13/2020  PCP: [name], MD Referring Provier: [name], MD  Therapy Diagnosis: Unsteadiness on feet  Difficulty in walking, not elsewhere classified  Other abnormalities of gait and mobility   Referring Diagnosis:R29.6 (ICD-10-CM) - Frequent falls Visit Diagnosis:R26.89 Other abnormalities of gait and mobility; Unsteadiness of feet R26.81; difficulty in walking R26.2   PT End of Session - 05/13/20 1020    Visit Number 10    Number of Visits 13    Date for PT Re-Evaluation 06/01/20    Progress Note Due on Visit 19   progress note completed on 9th visit   PT Start Time 1018    PT Stop Time 1058    PT Time Calculation (min) 40 min    Equipment Utilized During Treatment Other (comment)   RW trialed   Activity Tolerance Patient tolerated treatment well    Behavior During Therapy WFL for tasks assessed/performed            Past Medical History:  Diagnosis Date  . Acute blood loss anemia   . Acute upper respiratory infections of unspecified site 11/04/2012  . AKI (acute kidney injury)   . Benign prostatic hyperplasia   . CAD (coronary artery disease)    s/p cypher DES to pLAD 6/08; normal LVF;  ETT-Myoview 2009: no ischemia   . Clavicle fracture 08/10/2019  . Closed displaced fracture of phalanx of left thumb, sequela 08/10/2019  . Coronary atherosclerosis of native coronary artery 03/08/2008  . Eosinophilia   . Essential hypertension   . Fracture of multiple ribs with pain 08/10/2019  . History of multiple falls   . History of SAH (subarachnoid hemorrhage) 07/14/2019  . Hyperlipidemia type IIB / III 03/08/2008  . Major neurocognitive disorder due to possible Alzheimer's disease, without behavioral disturbance 02/29/2020  . MI (myocardial infarction)   . 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 Nocturnal leg cramps 11/23/2012  . Orthostatic hypotension 03/08/2008  . Pain in joint, shoulder region 11/23/2012  . Stage 3b chronic kidney disease   . Thrombocytopenia   . Trigeminal neuralgia   . Type 2 diabetes mellitus    Past Surgical History:  Procedure Laterality Date  . APPENDECTOMY    . CARDIAC CATHETERIZATION  07/30/2006   CORONARY ANGIOPLASTY WITH STENT PLACEMENT  . CARDIAC CATHETERIZATION    . EXPLORATORY LAPAROTOMY     age 14  . EXPLORATORY LAPAROTOMY    . IR THORACENTESIS ASP PLEURAL SPACE W/IMG GUIDE  10/05/2019   Patient Active Problem List   Diagnosis Date Noted  . Dizziness 03/08/2020  . Major neurocognitive disorder due to possible Alzheimer's disease, without behavioral disturbance 02/29/2020  . CAD (coronary artery disease)   . MI (myocardial infarction)   . Trigeminal neuralgia   . 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 Multiple trauma   . Benign prostatic hyperplasia   . Essential hypertension   . Stage 3b chronic kidney disease ([place])   . Thrombocytopenia   . History of multiple falls 07/14/2019  . History of SAH (subarachnoid hemorrhage) 07/14/2019  . Type 2 diabetes mellitus   . Eosinophilia   . Nocturnal leg cramps 11/23/2012  . Pain in joint, shoulder region 11/23/2012  . Hematoma 11/04/2012  . Hyperlipidemia type IIB / III 03/08/2008  . Orthostatic hypotension 03/08/2008  . Coronary atherosclerosis of  native coronary artery 03/08/2008   SUBJECTIVE:  Patient reports no spinning sensation, and has been sleeping on the R side. Patient is supposed to pick up the Rollator today. No other new changes/falls.   Pain: is patient experiencing pain? No.   OBJECTIVE:   PT Treatment:   Completed gait training with Rollator, outdoors on unlevel surfaces including pavement and indoor surfaces x 1500 ft. Patient ambulating up down/incline, with PT teaching patient proper brake management. Also  completed negotiation around obstacles, and practiced locking brakes to turn and sit for seated rest break. Patient and wife verbalized understanding on proper management of rollator. Also completed training with sit <> stand and when to properly use brakes. PT did have to provide 1-2 instances of cues to use brakes to slow rollator down with decline, but overall demonstrating proper use of AD, as well as improved independence. PT also educating on proper height adjustment, along with how to fold rollator for placement in the car.   Without AD: completed obstacle course including ambulating over agility ladder working on improved reciprocal steps, followed by stepping over hurdles. Completed x 3 laps down and back. Increased verbal cues for step length. Intermittent CGA with completion. At countertop completed lateral side stepping over orange hurdles, x 3 laps down and back, verbal cues for technique and step length. Intermittent UE support required from countertop.  At countertop with intermittent UE support: completed marching forwards x 4 laps down and back, verbal cues for improved SLS and control to promote balance. Then followed by tandem gait x 4 laps down and back, patient demo improved balance with reduced UE support. Intermittent CGA required.    Patient Education: Education details: Bringing in Rollator to Next Session for Adjustment and continued practice. Resolution of BPPV.  Person educated: Patient and Spouse Education method: Explanation Education comprehension: verbalized understanding   Assessment: Clinical impression:  Today's skilled PT session including continued gait training with Rollator, as patient is supposed to pick up personal rollator today. Patient ambulating indoors and outdoors x 1500 ft, with patient demonstrating improved stability and independence. Rest of session spent working on improved standing balance and high level balance. No repotrs of spinning sensation,  therefore no assesment for BPPV today. Will continue to progress toward all LTGs.   Rehab potential: Fair potentially due to cognitive deficits  Clinical decision making: Evolving/moderate complexity  Evaluation complexity: Moderate   Goals: Goals reviewed with patient? Yes  SHORT TERM GOALS:  STG Name Target Date Goal status  1 Pt will report <1 episodes of dizziness in a week to improve dizziness Comments: pt reports 1-2 dizziness episodes; 05/09/20: still continue to have 1-2 episodes of dizziness 05/04/20 On -Going  2 Pt will demonstrate improved safety awareness by counting to 5 when changing positions to reduce fall frequency Comments: Met on 4/4, reports independence with counting between position changes  05/04/20 MET   LONG TERM GOALS:   LTG Name Target Date Goal status  1 Pt will report no falls or near falls in 3-4 consecutive weeks to reduce fall risk and improve safety Comments:  06/01/20 INITIAL  2 Pt will demo at least 5 points of change in Functional Gait Index to improve functional balance Comments: To be tested 06/01/20 INITIAL  3 Pt will report no symptoms of dizziness in 3 consecutive weeks to improve balance and safety with position changes. Comments: Eval: 1-2x/week 06/01/20 INITIAL   PLAN: PT frequency: 2x/week  PT duration: 8 weeks  Planned Interventions: Therapeutic exercises,  Therapeutic activity, Neuro Muscular re-education, Balance training, Gait training, Patient/Family education, Joint mobilization, Stair training, Vestibular training, Canalith repositioning, Visual/preceptual remediation/compensation, Spinal mobilization and Traction   Plan for next session: Did we bring in Rollator? Continued balance/standing strengthening activities. Leg Press.   Jones Bales, PT, DPT 05/09/20, [name]   [facility] [address] [place], [place], [address] Phone: [phone]   Fax:   [phone]  Patient name: [name] MRN: [id number] DOB: 14-Mar-1933

## 2020-05-13 ENCOUNTER — Ambulatory Visit: Payer: PPO

## 2020-05-13 ENCOUNTER — Other Ambulatory Visit: Payer: Self-pay

## 2020-05-13 DIAGNOSIS — R2681 Unsteadiness on feet: Secondary | ICD-10-CM | POA: Diagnosis not present

## 2020-05-13 DIAGNOSIS — R262 Difficulty in walking, not elsewhere classified: Secondary | ICD-10-CM

## 2020-05-13 DIAGNOSIS — R2689 Other abnormalities of gait and mobility: Secondary | ICD-10-CM

## 2020-05-15 NOTE — Therapy (Signed)
Outpatient Physical Therapy Treatment Patient Name: [name] MRN: [id number] DOB:07/27/1933, 87 y.o., male Today's Date: 05/16/2020  PCP: [name], MD Referring Provier: [name], MD  Therapy Diagnosis: Unsteadiness on feet  Difficulty in walking, not elsewhere classified  Other abnormalities of gait and mobility   Referring Diagnosis:R29.6 (ICD-10-CM) - Frequent falls Visit Diagnosis:R26.89 Other abnormalities of gait and mobility; Unsteadiness of feet R26.81; difficulty in walking R26.2   PT End of Session - 05/16/20 1019    Visit Number 11    Number of Visits 13    Date for PT Re-Evaluation 06/01/20    Progress Note Due on Visit 19   progress note completed on 9th visit   PT Start Time 1017    PT Stop Time 1059    PT Time Calculation (min) 42 min    Equipment Utilized During Treatment Other (comment)   RW trialed   Activity Tolerance Patient tolerated treatment well    Behavior During Therapy WFL for tasks assessed/performed            Past Medical History:  Diagnosis Date  . Acute blood loss anemia   . Acute upper respiratory infections of unspecified site 11/04/2012  . AKI (acute kidney injury)   . Benign prostatic hyperplasia   . CAD (coronary artery disease)    s/p cypher DES to pLAD 6/08; normal LVF;  ETT-Myoview 2009: no ischemia   . Clavicle fracture 08/10/2019  . Closed displaced fracture of phalanx of left thumb, sequela 08/10/2019  . Coronary atherosclerosis of native coronary artery 03/08/2008  . Eosinophilia   . Essential hypertension   . Fracture of multiple ribs with pain 08/10/2019  . History of multiple falls   . History of SAH (subarachnoid hemorrhage) 07/14/2019  . Hyperlipidemia type IIB / III 03/08/2008  . Major neurocognitive disorder due to possible Alzheimer's disease, without behavioral disturbance 02/29/2020  . MI (myocardial infarction)   . 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 Nocturnal leg cramps 11/23/2012  . Orthostatic hypotension 03/08/2008  . Pain in joint, shoulder region 11/23/2012  . Stage 3b chronic kidney disease   . Thrombocytopenia   . Trigeminal neuralgia   . Type 2 diabetes mellitus    Past Surgical History:  Procedure Laterality Date  . APPENDECTOMY    . CARDIAC CATHETERIZATION  07/30/2006   CORONARY ANGIOPLASTY WITH STENT PLACEMENT  . CARDIAC CATHETERIZATION    . EXPLORATORY LAPAROTOMY     age 34  . EXPLORATORY LAPAROTOMY    . IR THORACENTESIS ASP PLEURAL SPACE W/IMG GUIDE  10/05/2019   Patient Active Problem List   Diagnosis Date Noted  . Dizziness 03/08/2020  . Major neurocognitive disorder due to possible Alzheimer's disease, without behavioral disturbance 02/29/2020  . CAD (coronary artery disease)   . MI (myocardial infarction)   . Trigeminal neuralgia   . 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 Multiple trauma   . Benign prostatic hyperplasia   . Essential hypertension   . Stage 3b chronic kidney disease ([place])   . Thrombocytopenia   . History of multiple falls 07/14/2019  . History of SAH (subarachnoid hemorrhage) 07/14/2019  . Type 2 diabetes mellitus   . Eosinophilia   . Nocturnal leg cramps 11/23/2012  . Pain in joint, shoulder region 11/23/2012  . Hematoma 11/04/2012  . Hyperlipidemia type IIB / III 03/08/2008  . Orthostatic hypotension 03/08/2008  . Coronary atherosclerosis of  native coronary artery 03/08/2008   SUBJECTIVE: Patient received his rollator, has been using it and is enjoying it. Reports a couple mild headaches. No falls.   Pain: is patient experiencing pain? No.   OBJECTIVE:   PT Treatment:  Pt brought personal rollator into session, PT adjusted for patients height. Completed gait training x 500 ft (plus additional clinic distances) on indoor surfaces, PT continuing education on proper brake management. PT also educating on getting close to surface  that will sit prior to turning and keeping AD with him at all times to promote safety. With indoor incline, completed ambulation up/down incline with patient able to complete with supervision, and demo good use of brakes. PT addressed any questions/concern of patient/wife regarding Rollator. PT educating on use of device for ambulation outdoors and long distance walks, continue to use [facility] for short distance ambulation.   At countertop standing on blue mat: completed multi directional stepping with card, completed x 5 reps through. Increased cognitive/dual task challenge with completing opposite of card, requiring cues intermittently for proper completion. 1-2 challenges to balance but able to maintain standing without assistance from PT. Close supervision throughout.   Completed on blue mat in // bars: completed marching forwards x 4 laps, verbal cues for slowed control pace to further promote SLS. Completed tandem gait on blue mat x 4 laps, down and back, verbal cues for technique. No UE support utilized, CGA throughout completion.   On incline completed alternating toe taps to colored pebbles x 15 reps, intermittent cues for slowed pace. CGA with completion.     Patient Education: Education details: Continued education on proper use of Rollator.  Person educated: Patient and Spouse Education method: Explanation Education comprehension: verbalized understanding   Assessment: Clinical impression:  Today's skilled PT session included continued gait training with patient's personal Rollator, PT adjusted for height and continued education of device. Patient/wife having no additional questions/concerns after today's session. Continued rest of session focused on balance activities, increased challenge with multidirectional stepping with cognitive task. Patient has made significant progress with PT services, will continue to progress toward all LTGs.   Rehab potential: Fair potentially due to  cognitive deficits  Clinical decision making: Evolving/moderate complexity  Evaluation complexity: Moderate   Goals: Goals reviewed with patient? Yes  SHORT TERM GOALS:  STG Name Target Date Goal status  1 Pt will report <1 episodes of dizziness in a week to improve dizziness Comments: pt reports 1-2 dizziness episodes; 05/09/20: still continue to have 1-2 episodes of dizziness 05/04/20 On -Going  2 Pt will demonstrate improved safety awareness by counting to 5 when changing positions to reduce fall frequency Comments: Met on 4/4, reports independence with counting between position changes  05/04/20 MET   LONG TERM GOALS:   LTG Name Target Date Goal status  1 Pt will report no falls or near falls in 3-4 consecutive weeks to reduce fall risk and improve safety Comments:  06/01/20 INITIAL  2 Pt will demo at least 5 points of change in Functional Gait Index to improve functional balance Comments: To be tested 06/01/20 INITIAL  3 Pt will report no symptoms of dizziness in 3 consecutive weeks to improve balance and safety with position changes. Comments: Eval: 1-2x/week 06/01/20 INITIAL   PLAN: PT frequency: 2x/week  PT duration: 8 weeks  Planned Interventions: Therapeutic exercises, Therapeutic activity, Neuro Muscular re-education, Balance training, Gait training, Patient/Family education, Joint mobilization, Stair training, Vestibular training, Canalith repositioning, Visual/preceptual remediation/compensation, Spinal mobilization and Traction  Plan for next session: Check LTGs. Update HEP. D/C   Jones Bales, PT, DPT 05/16/20  [facility] [address] [place], [place], [address] Phone: [phone]   Fax:  [phone]  Patient name: CAMILLO QUADROS MRN: [id number] DOB: [birth year]

## 2020-05-16 ENCOUNTER — Other Ambulatory Visit: Payer: Self-pay

## 2020-05-16 ENCOUNTER — Ambulatory Visit: Payer: PPO

## 2020-05-16 DIAGNOSIS — R2689 Other abnormalities of gait and mobility: Secondary | ICD-10-CM

## 2020-05-16 DIAGNOSIS — R2681 Unsteadiness on feet: Secondary | ICD-10-CM

## 2020-05-16 DIAGNOSIS — R262 Difficulty in walking, not elsewhere classified: Secondary | ICD-10-CM

## 2020-05-16 MED ORDER — RIVASTIGMINE TARTRATE 1.5 MG PO CAPS: ORAL_CAPSULE | ORAL | 5 refills | Status: DC

## 2020-05-17 NOTE — Therapy (Signed)
Outpatient Physical Therapy Treatment/Discharge Summary Patient Name: [name] MRN: [id number] DOB:Feb 01, 1934, 87 y.o., male Today's Date: 05/18/2020  PCP: [name], MD Referring Provier: [name], MD  PHYSICAL THERAPY DISCHARGE SUMMARY  Visits from Start of Care: 12  Current functional level related to goals / functional outcomes: See Clinical Impression Statement   Remaining deficits: Low Fall Risk   Education / Equipment: HEP Provided. Obtained new Rollator for ambulation outdoors/long distance  Plan: Patient agrees to discharge.  Patient goals were met. Patient is being discharged due to meeting the stated rehab goals.  ?????        Therapy Diagnosis: Unsteadiness on feet  Difficulty in walking, not elsewhere classified  Other abnormalities of gait and mobility   Referring Diagnosis:R29.6 (ICD-10-CM) - Frequent falls Visit Diagnosis:R26.89 Other abnormalities of gait and mobility; Unsteadiness of feet R26.81; difficulty in walking R26.2   PT End of Session - 05/18/20 1021    Visit Number 12    Number of Visits 13    Date for PT Re-Evaluation 06/01/20    Progress Note Due on Visit 19   progress note completed on 9th visit   PT Start Time 1018    PT Stop Time 1048    PT Time Calculation (min) 30 min    Equipment Utilized During Treatment Other (comment)   RW trialed   Activity Tolerance Patient tolerated treatment well    Behavior During Therapy WFL for tasks assessed/performed            Past Medical History:  Diagnosis Date  . Acute blood loss anemia   . Acute upper respiratory infections of unspecified site 11/04/2012  . AKI (acute kidney injury)   . Benign prostatic hyperplasia   . CAD (coronary artery disease)    s/p cypher DES to pLAD 6/08; normal LVF;  ETT-Myoview 2009: no ischemia   . Clavicle fracture 08/10/2019  . Closed displaced fracture of phalanx of left thumb, sequela 08/10/2019  . Coronary atherosclerosis of  native coronary artery 03/08/2008  . Eosinophilia   . Essential hypertension   . Fracture of multiple ribs with pain 08/10/2019  . History of multiple falls   . History of SAH (subarachnoid hemorrhage) 07/14/2019  . Hyperlipidemia type IIB / III 03/08/2008  . Major neurocognitive disorder due to possible Alzheimer's disease, without behavioral disturbance 02/29/2020  . MI (myocardial infarction)   . 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 Nocturnal leg cramps 11/23/2012  . Orthostatic hypotension 03/08/2008  . Pain in joint, shoulder region 11/23/2012  . Stage 3b chronic kidney disease   . Thrombocytopenia   . Trigeminal neuralgia   . Type 2 diabetes mellitus    Past Surgical History:  Procedure Laterality Date  . APPENDECTOMY    . CARDIAC CATHETERIZATION  07/30/2006   CORONARY ANGIOPLASTY WITH STENT PLACEMENT  . CARDIAC CATHETERIZATION    . EXPLORATORY LAPAROTOMY     age 68  . EXPLORATORY LAPAROTOMY    . IR THORACENTESIS ASP PLEURAL SPACE W/IMG GUIDE  10/05/2019   Patient Active Problem List   Diagnosis Date Noted  . Dizziness 03/08/2020  . Major neurocognitive disorder due to possible Alzheimer's disease, without behavioral disturbance 02/29/2020  . CAD (coronary artery disease)   . MI (myocardial infarction)   . Trigeminal neuralgia   . 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 Multiple trauma   . Benign prostatic hyperplasia   .  Essential hypertension   . Stage 3b chronic kidney disease ([place])   . Thrombocytopenia   . History of multiple falls 07/14/2019  . History of SAH (subarachnoid hemorrhage) 07/14/2019  . Type 2 diabetes mellitus   . Eosinophilia   . Nocturnal leg cramps 11/23/2012  . Pain in joint, shoulder region 11/23/2012  . Hematoma 11/04/2012  . Hyperlipidemia type IIB / III 03/08/2008  . Orthostatic hypotension 03/08/2008  . Coronary atherosclerosis of native coronary artery  03/08/2008   SUBJECTIVE: Patient reports feeling comfortable with Rollator. No falls.   Pain: is patient experiencing pain? No.   OBJECTIVE:   PT Treatment:   [facility] PT Assessment - 05/18/20 0001      Functional Gait  Assessment   Gait assessed  Yes    Gait Level Surface Walks 20 ft in less than 7 sec but greater than 5.5 sec, uses assistive device, slower speed, mild gait deviations, or deviates 6-10 in outside of the 12 in walkway width.    Change in Gait Speed Able to smoothly change walking speed without loss of balance or gait deviation. Deviate no more than 6 in outside of the 12 in walkway width.    Gait with Horizontal Head Turns Performs head turns smoothly with slight change in gait velocity (eg, minor disruption to smooth gait path), deviates 6-10 in outside 12 in walkway width, or uses an assistive device.    Gait with Vertical Head Turns Performs head turns with no change in gait. Deviates no more than 6 in outside 12 in walkway width.    Gait and Pivot Turn Pivot turns safely within 3 sec and stops quickly with no loss of balance.    Step Over Obstacle Is able to step over one shoe box (4.5 in total height) without changing gait speed. No evidence of imbalance.    Gait with Narrow Base of Support Ambulates 7-9 steps.    Gait with Eyes Closed Walks 20 ft, uses assistive device, slower speed, mild gait deviations, deviates 6-10 in outside 12 in walkway width. Ambulates 20 ft in less than 9 sec but greater than 7 sec.    Ambulating Backwards Walks 20 ft, uses assistive device, slower speed, mild gait deviations, deviates 6-10 in outside 12 in walkway width.    Steps Alternating feet, must use rail.    Total Score 23    FGA comment: 23/30           Reviewed Current HEP in session, addressed any questions/concerns. Educated on continued compliance of HEP and participation in walking program.   Standing Marching   Using a chair if necessary, march in place. Repeat 10 times.  Do 1 sessions per day.  http://[email or web address].exer.us/344    Hip Backward Kick - ALTERNATE KICKS   Using a chair for balance, keep legs shoulder width apart and toes pointed for- ward. Slowly extend one leg back, keeping knee straight. Do not lean forward. Repeat with other leg. Repeat 10 times. Do 1 sessions per day.  http://[email or web address].exer.us/340     Hip Side Kick   Holding a chair for balance, keep legs shoulder width apart and toes pointed forward. Swing a leg out to side, keeping knee straight. Do not lean. Repeat using other leg. Repeat 10 times. Do 1 sessions per day.  FORWARD KICKS - ALTERNATE LEGS - 10 REPS - HOLD ONTO COUNTER AS NEEDED  Feet Heel-Toe "Tandem", Varied Arm Positions - Eyes Open- PARTIAL HEEL TO TOE    With eyes open, right  foot directly in front of the other, arms out, look straight ahead at a stationary object. Hold 20-30 seconds. Repeat 2 times per session. Do 1 sessions per day. SWITCH FOOT POSITIONS - PLACE EACH FOOT IN FRONT FOR 1 REP  Standing On One Leg Without Support - HOLD AS NEEDED .  Stand on one leg in neutral spine without support. Hold 10 seconds. Repeat on other leg. Do 1 repetitions, 1 set. DO 2-3X A DAY  http://bt.exer.us/36   AMBULATION: Walk Backward   Walk backward. Take large steps, do not drag feet. 1 reps per set, 1 sets per day, 5 days per week Use assistive device. HOLD ONTO COUNTER AS NEEDED .      Braiding   Move to side: 1) cross right leg in front of left, 2) bring back leg out to side, then 3) cross right leg behind left, 4) bring left leg out to side. Continue sequence in same direction. Reverse sequence, moving in opposite direction. Repeat sequence 1 times per session. Do 1 sessions per day.    Patient Education: Education details: progress toward LTGs, Updated HEP, Continued use of Rollator for Safety outdoors/community. Obtain new referral if return of [facility]  Person educated: Patient and Spouse Education  method: Explanation Education comprehension: verbalized understanding   Assessment: Clinical impression:  Today's skilled PT session included assessment of patient's progress toward all LTGs. Patient able to meet/partially meet all LTGs today. Patient reports no episodes of recent dizziness/falls. Patient demo improved in balance and reduced fall risk with 23/30 on FGA. Patient has demonstrated significant improvements with PT, and demo readiness for d/c at this time. PT educating on continued compliance with HEP, and continue use of rollator for outdoor surfaces. Patient and wife agreeable to d/c today.    Rehab potential: Fair potentially due to cognitive deficits  Clinical decision making: Evolving/moderate complexity  Evaluation complexity: Moderate   Goals: Goals reviewed with patient? Yes  SHORT TERM GOALS:  STG Name Target Date Goal status  1 Pt will report <1 episodes of dizziness in a week to improve dizziness Comments: pt reports 1-2 dizziness episodes; 05/09/20: still continue to have 1-2 episodes of dizziness 05/04/20 On -Going  2 Pt will demonstrate improved safety awareness by counting to 5 when changing positions to reduce fall frequency Comments: Met on 4/4, reports independence with counting between position changes  05/04/20 MET   LONG TERM GOALS:   LTG Name Target Date Goal status  1 Pt will report no falls or near falls in 3-4 consecutive weeks to reduce fall risk and improve safety Comments: reports no falls in last 4 weeks, did have 1-2 stumbles but reports due to dizziness.   06/01/20 PARTIALY MET  2 Pt will demo at least 5 points of change in Functional Gait Index to improve functional balance Comments: 18/30, 23/30 06/01/20 MET  3 Pt will report no symptoms of dizziness in 3 consecutive weeks to improve balance and safety with position changes. Comments: Eval: reports no episodes of dizziness in 1-2 weeks 06/01/20 PARTIALY MET   PLAN: PT frequency:  2x/week  PT duration: 8 weeks  Planned Interventions: Therapeutic exercises, Therapeutic activity, Neuro Muscular re-education, Balance training, Gait training, Patient/Family education, Joint mobilization, Stair training, Vestibular training, Canalith repositioning, Visual/preceptual remediation/compensation, Spinal mobilization and Traction   Plan for next session:    Jones Bales, PT, DPT 05/18/20  [facility] [address] [place], [place], [address] Phone: [phone]   Fax:  [phone]  Patient name: [name] MRN: [id number] DOB: Jun 02, 1933

## 2020-05-18 ENCOUNTER — Ambulatory Visit: Payer: PPO

## 2020-05-18 ENCOUNTER — Other Ambulatory Visit: Payer: Self-pay

## 2020-05-18 DIAGNOSIS — R2689 Other abnormalities of gait and mobility: Secondary | ICD-10-CM

## 2020-05-18 DIAGNOSIS — R2681 Unsteadiness on feet: Secondary | ICD-10-CM | POA: Diagnosis not present

## 2020-05-18 DIAGNOSIS — R262 Difficulty in walking, not elsewhere classified: Secondary | ICD-10-CM

## 2020-05-18 NOTE — Patient Instructions (Signed)
Standing Marching   Using a chair if necessary, march in place. Repeat 10 times. Do 1 sessions per day.  http://[email or web address].exer.us/344    Hip Backward Kick - ALTERNATE KICKS   Using a chair for balance, keep legs shoulder width apart and toes pointed for- ward. Slowly extend one leg back, keeping knee straight. Do not lean forward. Repeat with other leg. Repeat 10 times. Do 1 sessions per day.  http://[email or web address].exer.us/340     Hip Side Kick   Holding a chair for balance, keep legs shoulder width apart and toes pointed forward. Swing a leg out to side, keeping knee straight. Do not lean. Repeat using other leg. Repeat 10 times. Do 1 sessions per day.  FORWARD KICKS - ALTERNATE LEGS - 10 REPS - HOLD ONTO COUNTER AS NEEDED  Feet Heel-Toe "Tandem", Varied Arm Positions - Eyes Open- PARTIAL HEEL TO TOE    With eyes open, right foot directly in front of the other, arms out, look straight ahead at a stationary object. Hold 20-30 seconds. Repeat 2 times per session. Do 1 sessions per day. SWITCH FOOT POSITIONS - PLACE EACH FOOT IN FRONT FOR 1 REP  Copyright  VHI. All rights reserved.       Standing On One Leg Without Support - HOLD AS NEEDED .  Stand on one leg in neutral spine without support. Hold 10 seconds. Repeat on other leg. Do 1 repetitions, 1 set. DO 2-3X A DAY  http://bt.exer.us/36   Copyright  VHI. All rights reserved.       AMBULATION: Walk Backward   Walk backward. Take large steps, do not drag feet. 1 reps per set, 1 sets per day, 5 days per week Use assistive device. HOLD ONTO COUNTER AS NEEDED .      Braiding   Move to side: 1) cross right leg in front of left, 2) bring back leg out to side, then 3) cross right leg behind left, 4) bring left leg out to side. Continue sequence in same direction. Reverse sequence, moving in opposite direction. Repeat sequence 1 times per session. Do 1 sessions per day.

## 2020-05-30 ENCOUNTER — Telehealth: Payer: Self-pay | Admitting: Family Medicine

## 2020-05-30 NOTE — Telephone Encounter (Signed)
Copied from [place] [id number]. Topic: Medicare AWV >> May 30, 2020  2:47 PM Cher Nakai R wrote: Reason for CRM: No answer unable to leave a message for patient to call back and schedule Medicare Annual Wellness Visit (AWV) in office.   If not able to come in office, please offer to do virtually or by telephone.   Last AWV:  04/07/2018  Please schedule at anytime with [facility]Nurse Health Advisor.  If any questions, please contact me at [phone]

## 2020-05-31 ENCOUNTER — Telehealth: Payer: Self-pay | Admitting: Neurology

## 2020-05-31 MED ORDER — RIVASTIGMINE TARTRATE 1.5 MG PO CAPS: ORAL_CAPSULE | ORAL | 0 refills | Status: DC

## 2020-05-31 NOTE — Telephone Encounter (Signed)
If he hasn't already, have patient decrease dose to once a day.  If he did try decreasing the dose to once a day and night sweats resolved, then I would remain on once a day dosing for now until Dr. Delice Lesch returns and she can provide further recommendations.  If he did try decreasing dose to once a day and night sweats did not improve, then I would discontinue it and wait for further instructions from Dr. Delice Lesch when she returns.

## 2020-05-31 NOTE — Telephone Encounter (Signed)
New message   Pt c/o medication issue:  1. Name of Medication: rivastigmine (EXELON) 1.5 MG capsule  2. How are you currently taking this medication (dosage and times per day)? Twice a day    3. Are you having a reaction (difficulty breathing--STAT)? No   4. What is your medication issue? Still having night sweats / direction on how to proceed /   5.[id number] - [place], [place] - [address] AT [address]

## 2020-05-31 NOTE — Telephone Encounter (Signed)
Spoke with pt wife they will decrease medication to once a day taken it in the morning. Night sweats do not happen every night, Pt wife informed that we will call her back if Dr Delice Lesch would like a to add any further recommendations. Refill sent to pharmacy

## 2020-06-07 NOTE — Telephone Encounter (Signed)
Received MyChart from patient.   States that he is out of town, but will call office to schedule AWV with PCP when he returns at the end of May.   States that he would prefer to have visit with PCP and not [facility]Nurse Health Advisor.

## 2020-06-10 ENCOUNTER — Other Ambulatory Visit: Payer: Self-pay | Admitting: *Deleted

## 2020-06-10 MED ORDER — BREO ELLIPTA 200-25 MCG/INH IN AEPB: INHALATION_SPRAY | RESPIRATORY_TRACT | 1 refills | Status: DC

## 2020-06-30 ENCOUNTER — Other Ambulatory Visit: Payer: Self-pay

## 2020-06-30 ENCOUNTER — Ambulatory Visit (INDEPENDENT_AMBULATORY_CARE_PROVIDER_SITE_OTHER): Payer: PPO | Admitting: Nurse Practitioner

## 2020-06-30 ENCOUNTER — Encounter: Payer: Self-pay | Admitting: Nurse Practitioner

## 2020-06-30 VITALS — BP 144/64 | HR 71 | Temp 98.1°F | Ht 67.5 in | Wt 181.2 lb

## 2020-06-30 DIAGNOSIS — H60391 Other infective otitis externa, right ear: Secondary | ICD-10-CM | POA: Diagnosis not present

## 2020-06-30 MED ORDER — CIPROFLOXACIN-HYDROCORTISONE 0.2-1 % OT SUSP: 3.0000 [drp] | Freq: Two times a day (BID) | OTIC | 0 refills | Status: DC

## 2020-06-30 MED ORDER — AMOXICILLIN-POT CLAVULANATE 875-125 MG PO TABS: 1.0000 | ORAL_TABLET | Freq: Two times a day (BID) | ORAL | 0 refills | Status: AC

## 2020-06-30 NOTE — Progress Notes (Signed)
Subjective:    Patient ID: [name], male    DOB: Jun 28, 1933, 87 y.o.   MRN: [id number]  HPI: [name] is a 87 y.o. male presenting with wife, [name], for ear pain of right ear.   Chief Complaint  Patient presents with  . Ear Pain    Right ear has dry skin, due from a med that he is on that causing the side effect   EAR PAIN Patient reports a few weeks ago, he had his left ear flushed at urgent care to remove the wax.  At this time, the provider also flush the right ear.  Since this time, he has had worsening ear pain over the past couple of weeks. Duration: months - 6 weeks or so Involved ear(s): right Severity: mild to severe Quality:  Ice pick Fever: no Otorrhea: no Upper respiratory infection symptoms: no Pruritus: yes Hearing loss: yes Water immersion yes- recently had ears flushed at urgent care Using Q-tips: no Recurrent otitis media: no Status: worse Treatments attempted: used ear antibiotics after  - Cortisporin  Allergies  Allergen Reactions  . Gabapentin Other (See Comments)    Visual changes and confusion- "I went blind while I was driving"    Outpatient Encounter Medications as of 06/30/2020  Medication Sig  . albuterol (PROAIR HFA) 108 (90 Base) MCG/ACT inhaler INHALE 2 PUFFS INTO THE LUNGS EVERY 6 HOURS AS NEEDED FOR WHEEZING OR SHORTNESS OF BREATH (Patient taking differently: Inhale 2 puffs into the lungs every 6 (six) hours as needed for wheezing or shortness of breath.)  . amLODipine (NORVASC) 5 MG tablet Take 1 tablet (5 mg total) by mouth daily.  Marland Kitchen amoxicillin-clavulanate (AUGMENTIN) 875-125 MG tablet Take 1 tablet by mouth 2 (two) times daily for 7 days.  Marland Kitchen Apoaequorin (PREVAGEN EXTRA STRENGTH PO) Take 1 tablet by mouth daily.  Marland Kitchen aspirin EC 81 MG tablet Take 81 mg by mouth daily. Swallow whole.  Marland Kitchen atorvastatin (LIPITOR) 20 MG tablet Take 1 tablet by mouth daily.  Marland Kitchen atorvastatin (LIPITOR) 40 MG tablet Take 40 mg by mouth daily.  .  Calcium Carb-Cholecalciferol (CALCIUM 600+D3 PO) Take 1 tablet by mouth 2 (two) times daily.  . diclofenac Sodium (VOLTAREN) 1 % GEL Apply 1 application topically daily as needed (pain).  Marland Kitchen doxazosin (CARDURA) 2 MG tablet TAKE 1 TABLET BY MOUTH EVERY DAY (Patient taking differently: Take 2 mg by mouth daily.)  . escitalopram (LEXAPRO) 10 MG tablet TAKE 1 TABLET BY MOUTH EVERY DAY (Patient taking differently: Take 10 mg by mouth daily.)  . ezetimibe (ZETIA) 10 MG tablet TAKE 1 TABLET BY MOUTH EVERY DAY (Patient taking differently: Take 10 mg by mouth at bedtime.)  . fluticasone furoate-vilanterol (BREO ELLIPTA) 200-25 MCG/INH AEPB INHALE 1 PUFF BY MOUTH EVERY DAY  . loratadine (CLARITIN) 10 MG tablet Take 10 mg by mouth daily as needed for allergies or rhinitis.   . Multiple Vitamin (MULTIVITAMIN WITH MINERALS) TABS tablet Take 1 tablet by mouth daily.  . rivastigmine (EXELON) 1.5 MG capsule Take 1 tablet twice a day  . [DISCONTINUED] ciprofloxacin-hydrocortisone (CIPRO HC OTIC) OTIC suspension Place 3 drops into the right ear 2 (two) times daily for 7 days.  . [DISCONTINUED] doxycycline (VIBRA-TABS) 100 MG tablet Take 1 tablet (100 mg total) by mouth 2 (two) times daily.   No facility-administered encounter medications on file as of 06/30/2020.    Patient Active Problem List   Diagnosis Date Noted  . Dizziness 03/08/2020  . Major  neurocognitive disorder due to possible Alzheimer's disease, without behavioral disturbance 02/29/2020  . CAD (coronary artery disease)   . MI (myocardial infarction)   . Trigeminal neuralgia   . 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 Multiple trauma   . Benign prostatic hyperplasia   . Essential hypertension   . Stage 3b chronic kidney disease ([place])   . Thrombocytopenia   . History of multiple falls 07/14/2019  . History of SAH (subarachnoid hemorrhage) 07/14/2019  . Type 2 diabetes mellitus   . Eosinophilia   . Nocturnal leg cramps  11/23/2012  . Pain in joint, shoulder region 11/23/2012  . Hematoma 11/04/2012  . Hyperlipidemia type IIB / III 03/08/2008  . Orthostatic hypotension 03/08/2008  . Coronary atherosclerosis of native coronary artery 03/08/2008    Past Medical History:  Diagnosis Date  . Acute blood loss anemia   . Acute upper respiratory infections of unspecified site 11/04/2012  . AKI (acute kidney injury)   . Benign prostatic hyperplasia   . CAD (coronary artery disease)    s/p cypher DES to pLAD 6/08; normal LVF;  ETT-Myoview 2009: no ischemia   . Clavicle fracture 08/10/2019  . Closed displaced fracture of phalanx of left thumb, sequela 08/10/2019  . Coronary atherosclerosis of native coronary artery 03/08/2008  . Eosinophilia   . Essential hypertension   . Fracture of multiple ribs with pain 08/10/2019  . History of multiple falls   . History of SAH (subarachnoid hemorrhage) 07/14/2019  . Hyperlipidemia type IIB / III 03/08/2008  . Major neurocognitive disorder due to possible Alzheimer's disease, without behavioral disturbance 02/29/2020  . MI (myocardial infarction)   . 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 Nocturnal leg cramps 11/23/2012  . Orthostatic hypotension 03/08/2008  . Pain in joint, shoulder region 11/23/2012  . Stage 3b chronic kidney disease   . Thrombocytopenia   . Trigeminal neuralgia   . Type 2 diabetes mellitus     Relevant past medical, surgical, family and social history reviewed and updated as indicated. Interim medical history since our last visit reviewed.  Review of Systems Per HPI unless specifically indicated above     Objective:    BP (!) 144/64   Pulse 71   Temp 98.1 F (36.7 C)   Ht 5' 7.5" (1.715 m)   Wt 181 lb 3.2 oz (82.2 kg)   SpO2 97%   BMI 27.96 kg/m   Wt Readings from Last 3 Encounters:  06/30/20 181 lb 3.2 oz (82.2 kg)  04/29/20 179 lb 3.2 oz (81.3 kg)  03/10/20 168 lb (76.2 kg)     Physical Exam Vitals and nursing note reviewed.  Constitutional:      General: He is not in acute distress.    Appearance: Normal appearance. He is not toxic-appearing.  HENT:     Head: Normocephalic and atraumatic.     Right Ear: Decreased hearing noted. Swelling and tenderness present. No mastoid tenderness.     Left Ear: Tympanic membrane and ear canal normal. No drainage or swelling.  No middle ear effusion. There is impacted cerumen. Tympanic membrane is not erythematous or bulging.     Ears:     Comments: Moderate swelling to right auditory canal, slightly painful to manipulation.  Unable to visualize entire tympanic membrane due to swelling of canal- about 25% visualized and intact without significant effusion.  No foreign body appreciated.    Nose: Nose normal. No  congestion or rhinorrhea.     Mouth/Throat:     Mouth: Mucous membranes are moist.     Pharynx: Oropharynx is clear. No oropharyngeal exudate or posterior oropharyngeal erythema.  Musculoskeletal:     Cervical back: Normal range of motion.  Lymphadenopathy:     Cervical: No cervical adenopathy.  Skin:    General: Skin is warm and dry.     Capillary Refill: Capillary refill takes less than 2 seconds.     Coloration: Skin is not jaundiced or pale.     Findings: No rash.  Neurological:     Mental Status: He is alert. Mental status is at baseline.  Psychiatric:        Mood and Affect: Mood normal.       Assessment & Plan:  1. Other infective acute otitis externa of right ear Acute.  We will try to treat with ciprofloxacin sterile drops given unknown status of entire tympanic membrane.  However, if too expensive, can consider oral Augmentin.  Recheck next week to ensure improving and to reassess tympanic membrane.  Follow-up with any issues in the meantime.  - amoxicillin-clavulanate (AUGMENTIN) 875-125 MG tablet; Take 1 tablet by mouth 2 (two) times daily for 7 days.  Dispense: 14 tablet; Refill: 0     Follow  up plan: Return in about 1 week (around 07/07/2020).

## 2020-07-01 NOTE — Patient Instructions (Signed)
Otitis Externa  Otitis externa is an infection of the outer ear canal. The outer ear canal is the area between the outside of the ear and the eardrum. Otitis externa is sometimes called swimmer's ear. What are the causes? Common causes of this condition include:  Swimming in dirty water.  Moisture in the ear.  An injury to the inside of the ear.  An object stuck in the ear.  A cut or scrape on the outside of the ear. What increases the risk? You are more likely to develop this condition if you go swimming often. What are the signs or symptoms? The first symptom of this condition is often itching in the ear. Later symptoms of the condition include:  Swelling of the ear.  Redness in the ear.  Ear pain. The pain may get worse when you pull on your ear.  Pus coming from the ear. How is this diagnosed? This condition may be diagnosed by examining the ear and testing fluid from the ear for bacteria and funguses. How is this treated? This condition may be treated with:  Antibiotic ear drops. These are often given for 10-14 days.  Medicines to reduce itching and swelling. Follow these instructions at home:  If you were prescribed antibiotic ear drops, use them as told by your health care provider. Do not stop using the antibiotic even if your condition improves.  Take over-the-counter and prescription medicines only as told by your health care provider.  Avoid getting water in your ears as told by your health care provider. This may include avoiding swimming or water sports for a few days.  Keep all follow-up visits as told by your health care provider. This is important. How is this prevented?  Keep your ears dry. Use the corner of a towel to dry your ears after you swim or bathe.  Avoid scratching or putting things in your ear. Doing these things can damage the ear canal or remove the protective wax that lines it, which makes it easier for bacteria and funguses to  grow.  Avoid swimming in lakes, polluted water, or pools that may not have enough chlorine. Contact a health care provider if:  You have a fever.  Your ear is still red, swollen, painful, or draining pus after 3 days.  Your redness, swelling, or pain gets worse.  You have a severe headache.  You have redness, swelling, pain, or tenderness in the area behind your ear. Summary  Otitis externa is an infection of the outer ear canal.  Common causes include swimming in dirty water, moisture in the ear, or a cut or scrape in the ear.  Symptoms include pain, redness, and swelling of the ear.  If you were prescribed antibiotic ear drops, use them as told by your health care provider. Do not stop using the antibiotic even if your condition improves. This information is not intended to replace advice given to you by your health care provider. Make sure you discuss any questions you have with your health care provider. Document Revised: 06/28/2017 Document Reviewed: 06/28/2017 Elsevier Patient Education  2021 Elsevier Inc.  

## 2020-07-03 ENCOUNTER — Other Ambulatory Visit: Payer: Self-pay | Admitting: Physician Assistant

## 2020-07-03 DIAGNOSIS — E782 Mixed hyperlipidemia: Secondary | ICD-10-CM

## 2020-07-06 ENCOUNTER — Telehealth: Payer: Self-pay

## 2020-07-06 ENCOUNTER — Telehealth: Payer: Self-pay | Admitting: *Deleted

## 2020-07-06 ENCOUNTER — Other Ambulatory Visit: Payer: Self-pay | Admitting: Nurse Practitioner

## 2020-07-06 DIAGNOSIS — H60391 Other infective otitis externa, right ear: Secondary | ICD-10-CM

## 2020-07-06 DIAGNOSIS — K869 Disease of pancreas, unspecified: Secondary | ICD-10-CM

## 2020-07-06 MED ORDER — CIPROFLOXACIN-DEXAMETHASONE 0.3-0.1 % OT SUSP: 4.0000 [drp] | Freq: Two times a day (BID) | OTIC | 0 refills | Status: AC

## 2020-07-06 NOTE — Telephone Encounter (Signed)
Results from 02/2020: 1. Slowly growing 2.5 x 2.0 cm septated cystic pancreatic tail mass communicating with the nondilated main pancreatic duct, compatible with a side branch IPMN. No high risk MRI features. Suggest follow-up MRI abdomen without and with IV   They think its a benign mass of the pancreas but they want [place] to watch it closely every 6 months with mri. Please schedule.

## 2020-07-06 NOTE — Telephone Encounter (Signed)
Orders placed.

## 2020-07-06 NOTE — Telephone Encounter (Signed)
-----   Message from Oblong, LPN sent at 07/18/2447  4:24 PM EST ----- Regarding: Order MRI MR Abdomen W Wo Contrast- F/U pancreatic tail lesion   Q 6 months

## 2020-07-06 NOTE — Telephone Encounter (Signed)
Ciprodex sent in

## 2020-07-07 NOTE — Telephone Encounter (Signed)
Yes, thank you for scheduling.

## 2020-07-08 ENCOUNTER — Other Ambulatory Visit: Payer: Self-pay

## 2020-07-08 ENCOUNTER — Ambulatory Visit (INDEPENDENT_AMBULATORY_CARE_PROVIDER_SITE_OTHER): Payer: PPO | Admitting: Family Medicine

## 2020-07-08 ENCOUNTER — Encounter: Payer: Self-pay | Admitting: Family Medicine

## 2020-07-08 ENCOUNTER — Other Ambulatory Visit: Payer: Self-pay | Admitting: Neurology

## 2020-07-08 VITALS — BP 118/66 | HR 70 | Temp 98.1°F | Resp 16 | Ht 67.5 in | Wt 176.0 lb

## 2020-07-08 DIAGNOSIS — L299 Pruritus, unspecified: Secondary | ICD-10-CM | POA: Diagnosis not present

## 2020-07-08 MED ORDER — FLUOCINOLONE ACETONIDE SCALP 0.01 % EX OIL: 1.0000 "application " | TOPICAL_OIL | Freq: Every day | CUTANEOUS | 0 refills | Status: DC | PRN

## 2020-07-08 MED ORDER — KETOCONAZOLE 2 % EX SHAM: 1.0000 "application " | MEDICATED_SHAMPOO | CUTANEOUS | 0 refills | Status: DC

## 2020-07-08 MED ORDER — RIVASTIGMINE TARTRATE 1.5 MG PO CAPS: ORAL_CAPSULE | ORAL | 6 refills | Status: DC

## 2020-07-08 NOTE — Progress Notes (Signed)
Subjective:    Patient ID: [name], male    DOB: [birth year], 87 y.o.   MRN: [id number]  HPI   Patient was recently treated with otitis externa using Augmentin and later Ciprodex.  He continues to complain of itching in his right ear.  He states that the skin "peels from around his auditory canal and clogs up his ear canal".  He states that his auditory canal is always itching.  He also complains of itching behind his ear and in his scalp.  I question if he may have seborrheic dermatitis causing the itching.  On examination today, there is no erythema or exudate in the auditory canal.  The tympanic membrane appears healthy.  There is some residual white dried debris in the very distal portion of the auditory canal which appears to be dried eardrops.  There is very little wax  Past Medical History:  Diagnosis Date  . Acute blood loss anemia   . Acute upper respiratory infections of unspecified site 11/04/2012  . AKI (acute kidney injury)   . Benign prostatic hyperplasia   . CAD (coronary artery disease)    s/p cypher DES to pLAD 6/08; normal LVF;  ETT-Myoview 2009: no ischemia   . Clavicle fracture 08/10/2019  . Closed displaced fracture of phalanx of left thumb, sequela 08/10/2019  . Coronary atherosclerosis of native coronary artery 03/08/2008  . Eosinophilia   . Essential hypertension   . Fracture of multiple ribs with pain 08/10/2019  . History of multiple falls   . History of SAH (subarachnoid hemorrhage) 07/14/2019  . Hyperlipidemia type IIB / III 03/08/2008  . Major neurocognitive disorder due to possible Alzheimer's disease, without behavioral disturbance 02/29/2020  . MI (myocardial infarction)   . 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 Nocturnal leg cramps 11/23/2012  . Orthostatic hypotension 03/08/2008  . Pain in joint, shoulder region 11/23/2012  . Stage 3b chronic kidney disease   . Thrombocytopenia   .  Trigeminal neuralgia   . Type 2 diabetes mellitus    Past Surgical History:  Procedure Laterality Date  . APPENDECTOMY    . CARDIAC CATHETERIZATION  07/30/2006   CORONARY ANGIOPLASTY WITH STENT PLACEMENT  . CARDIAC CATHETERIZATION    . EXPLORATORY LAPAROTOMY     age 73  . EXPLORATORY LAPAROTOMY    . IR THORACENTESIS ASP PLEURAL SPACE W/IMG GUIDE  10/05/2019   Current Outpatient Medications on File Prior to Visit  Medication Sig Dispense Refill  . albuterol (PROAIR HFA) 108 (90 Base) MCG/ACT inhaler INHALE 2 PUFFS INTO THE LUNGS EVERY 6 HOURS AS NEEDED FOR WHEEZING OR SHORTNESS OF BREATH (Patient taking differently: Inhale 2 puffs into the lungs every 6 (six) hours as needed for wheezing or shortness of breath.) 6.7 g 0  . amLODipine (NORVASC) 5 MG tablet Take 1 tablet (5 mg total) by mouth daily. 90 tablet 3  . Apoaequorin (PREVAGEN EXTRA STRENGTH PO) Take 1 tablet by mouth daily.    . Calcium Carb-Cholecalciferol (CALCIUM 600+D3 PO) Take 1 tablet by mouth 2 (two) times daily.    . ciprofloxacin-dexamethasone (CIPRODEX) OTIC suspension Place 4 drops into the right ear 2 (two) times daily for 7 days. 7.5 mL 0  . diclofenac Sodium (VOLTAREN) 1 % GEL Apply 1 application topically daily as needed (pain).    Marland Kitchen doxazosin (CARDURA) 2 MG tablet TAKE 1 TABLET BY MOUTH EVERY DAY (Patient taking differently: Take 2 mg by mouth daily.)  90 tablet 3  . escitalopram (LEXAPRO) 10 MG tablet TAKE 1 TABLET BY MOUTH EVERY DAY (Patient taking differently: Take 10 mg by mouth daily.) 90 tablet 2  . ezetimibe (ZETIA) 10 MG tablet TAKE 1 TABLET BY MOUTH EVERY DAY (Patient taking differently: Take 10 mg by mouth at bedtime.) 90 tablet 3  . fluticasone furoate-vilanterol (BREO ELLIPTA) 200-25 MCG/INH AEPB INHALE 1 PUFF BY MOUTH EVERY DAY 60 each 1  . loratadine (CLARITIN) 10 MG tablet Take 10 mg by mouth daily as needed for allergies or rhinitis.     . Multiple Vitamin (MULTIVITAMIN WITH MINERALS) TABS tablet Take 1  tablet by mouth daily.    Marland Kitchen aspirin EC 81 MG tablet Take 81 mg by mouth daily. Swallow whole. (Patient not taking: Reported on 07/08/2020)    . atorvastatin (LIPITOR) 20 MG tablet Take 1 tablet by mouth daily. (Patient not taking: Reported on 07/08/2020)     No current facility-administered medications on file prior to visit.   Allergies  Allergen Reactions  . Gabapentin Other (See Comments)    Visual changes and confusion- "I went blind while I was driving"   Social History   Socioeconomic History  . Marital status: Married    Spouse name: [name]  . Number of children: 1  . Years of education: 67  . Highest education level: Doctorate  Occupational History  . Occupation: retired professor    Comment: History   . Occupation: missionary  Tobacco Use  . Smoking status: Never Smoker  . Smokeless tobacco: Never Used  Vaping Use  . Vaping Use: Never used  Substance and Sexual Activity  . Alcohol use: Not Currently  . Drug use: Never  . Sexual activity: Yes    Partners: Female  Other Topics Concern  . Not on file  Social History Narrative   ** Merged History Encounter **       Lives with his wife (second marriage in 2015, first marriage ended when his wife died alzheimer's disease). Since his remarriage, his adult daughter doesn't speak with him.      Right handed   Social Determinants of Health   Financial Resource Strain: Not on file  Food Insecurity: Not on file  Transportation Needs: Not on file  Physical Activity: Not on file  Stress: Not on file  Social Connections: Not on file  Intimate Partner Violence: Not on file   Family History  Problem Relation Age of Onset  . Stomach cancer Mother   . Memory loss Mother   . Heart disease Father   . Coronary artery disease Father 81  . Heart attack Father   . Heart disease Brother   . Aortic aneurysm Brother   . Colon cancer Brother   . Aortic aneurysm Brother   . Colon cancer Brother   . Arthritis Daughter         rheumatoid  . Coronary artery disease Brother   . Esophageal cancer Neg Hx   . Rectal cancer Neg Hx      Review of Systems  All other systems reviewed and are negative.      Objective:   Physical Exam Vitals reviewed.  Constitutional:      General: He is not in acute distress.    Appearance: He is well-developed. He is not diaphoretic.  HENT:     Right Ear: Tympanic membrane and ear canal normal.     Left Ear: Tympanic membrane and ear canal normal.  Neck:  Thyroid: No thyromegaly.     Vascular: No JVD.     Trachea: No tracheal deviation.  Cardiovascular:     Rate and Rhythm: Normal rate and regular rhythm.     Heart sounds: Normal heart sounds. No murmur heard. No friction rub. No gallop.   Pulmonary:     Effort: Pulmonary effort is normal. No accessory muscle usage, prolonged expiration or respiratory distress.     Breath sounds: No stridor. No wheezing or rales.  Musculoskeletal:     Cervical back: Neck supple. No erythema, rigidity, torticollis or crepitus. No pain with movement, spinous process tenderness or muscular tenderness. Normal range of motion.  Lymphadenopathy:     Cervical: No cervical adenopathy.  Neurological:     Mental Status: He is alert.     Cranial Nerves: Cranial nerves are intact.     Motor: No abnormal muscle tone.     Deep Tendon Reflexes: Reflexes are normal and symmetric.           Assessment & Plan:  Itching of ear  I do not see any residual otitis externa on today's exam.  I question if he may have seborrheic dermatitis causing the itching around his auditory canal and around his ears.  I recommended trying Nizoral shampoo twice weekly allowing it to work its way into his auditory canal and in his scalp before rinsing off.  He can also use Derma-Smoothe oil 1 or 2 drops in his auditory canal daily for 1 week to calm down the itching.  We will see if this will calm down the itching over the next 2 to 3 weeks.

## 2020-07-12 ENCOUNTER — Encounter: Payer: Self-pay | Admitting: *Deleted

## 2020-07-14 ENCOUNTER — Other Ambulatory Visit: Payer: Self-pay | Admitting: *Deleted

## 2020-07-14 MED ORDER — ESCITALOPRAM OXALATE 10 MG PO TABS: 1.0000 | ORAL_TABLET | Freq: Every day | ORAL | 2 refills | Status: DC

## 2020-07-18 ENCOUNTER — Ambulatory Visit
Admission: RE | Admit: 2020-07-18 | Discharge: 2020-07-18 | Disposition: A | Discharge status: Home / Self Care | Payer: PPO | Source: Ambulatory Visit | Attending: Family Medicine | Admitting: Family Medicine

## 2020-07-18 ENCOUNTER — Other Ambulatory Visit: Payer: Self-pay

## 2020-07-18 DIAGNOSIS — K869 Disease of pancreas, unspecified: Secondary | ICD-10-CM

## 2020-07-18 DIAGNOSIS — K8689 Other specified diseases of pancreas: Secondary | ICD-10-CM | POA: Diagnosis not present

## 2020-07-18 DIAGNOSIS — K862 Cyst of pancreas: Secondary | ICD-10-CM | POA: Diagnosis not present

## 2020-07-18 DIAGNOSIS — N281 Cyst of kidney, acquired: Secondary | ICD-10-CM | POA: Diagnosis not present

## 2020-07-18 DIAGNOSIS — I714 Abdominal aortic aneurysm, without rupture: Secondary | ICD-10-CM | POA: Diagnosis not present

## 2020-07-18 IMAGING — MR MR ABDOMEN WO/W CM
10 of 18 series · 23 of 48 positions shown · IV contrast (14 ml multihance)
Comparison: Multiple priors including most recent MRI abdomen
[DATE]

CLINICAL DATA: Surveillance of cystic lesion in the pancreatic tail

EXAM:
MRI ABDOMEN WITHOUT AND WITH CONTRAST
TECHNIQUE: Multiplanar multisequence MR imaging of the abdomen was performed
both before and after the administration of intravenous contrast.
CONTRAST:  15mL MULTIHANCE GADOBENATE DIMEGLUMINE 529 MG/ML IV SOLN

[Series 5: cor haste · coronal · 5.0mm · 0.70mm/px · 1 of 35 slices shown]
[im 1/35]
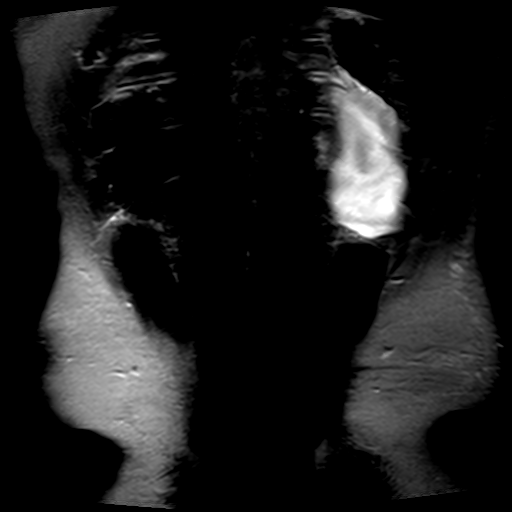

[Series 6: axial haste · axial · 6.0mm · 0.70mm/px · 1 of 33 slices shown]
[im 1/33]
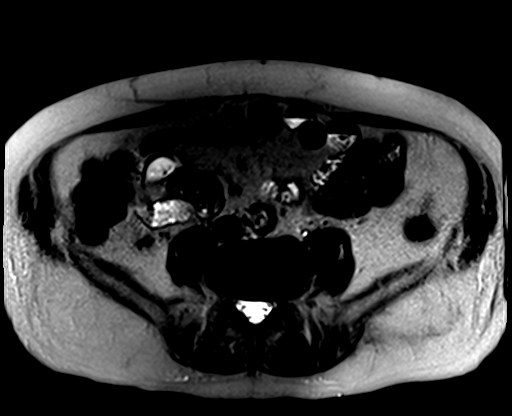

[Series 7: T1 · axial · 6.0mm · 0.70mm/px · z∈[-33,+178]mm · 2 of 66 slices shown]
[im 1/66]
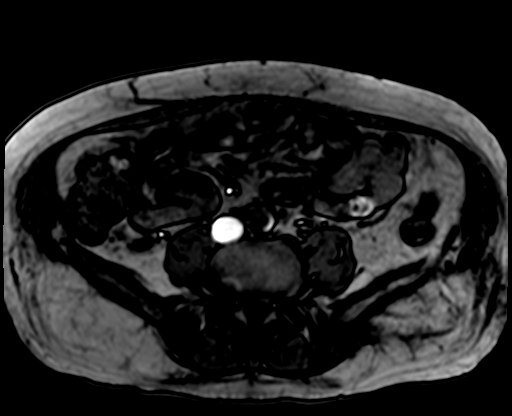
[im 66/66]
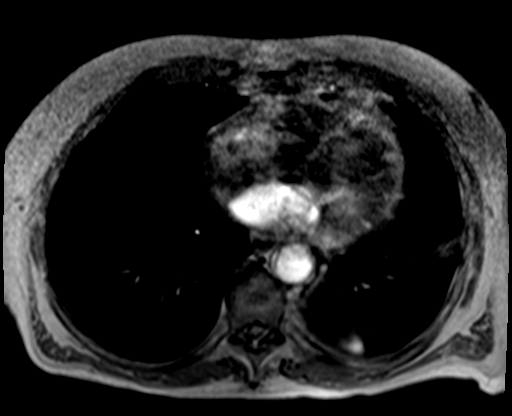

[Series 8: T2 · coronal · 3.0mm · 0.70mm/px · 1 of 48 slices shown (1 of 2)]
[im 1/48]
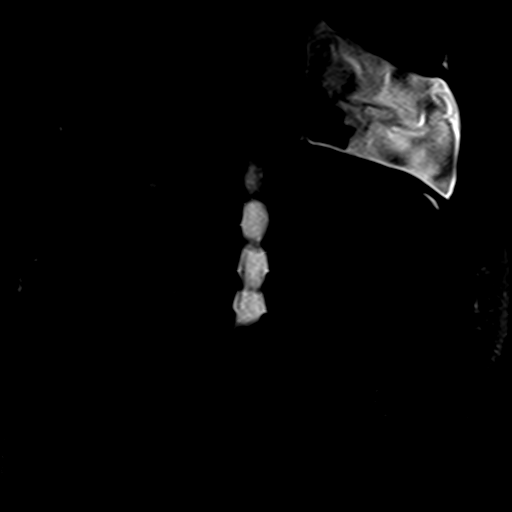

[Series 10: T2 · axial · 6.0mm · 1.41mm/px · 1 of 34 slices shown (2 of 2)]
[im 1/34]
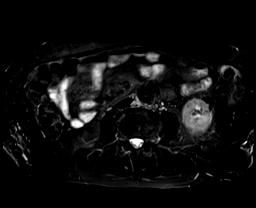

[Series 12: [id number] · axial · 6.0mm · 1.88mm/px · 1 of 34 slices shown]
[im 1/34]
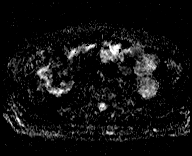

[Series 17: T1 dynamic · axial · non-contrast · 2.5mm · 0.70mm/px · z∈[-46,+192]mm · 4 of 96 slices shown]
[im 1/96]
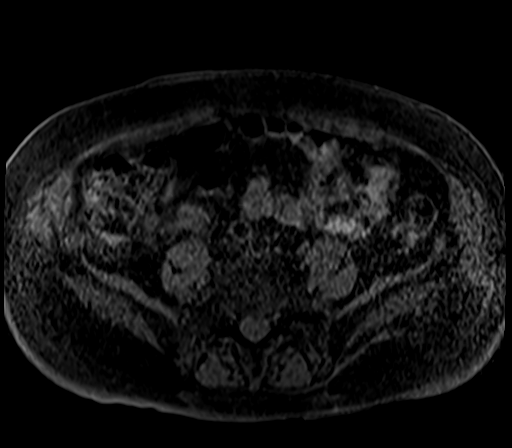
[im 32/96]
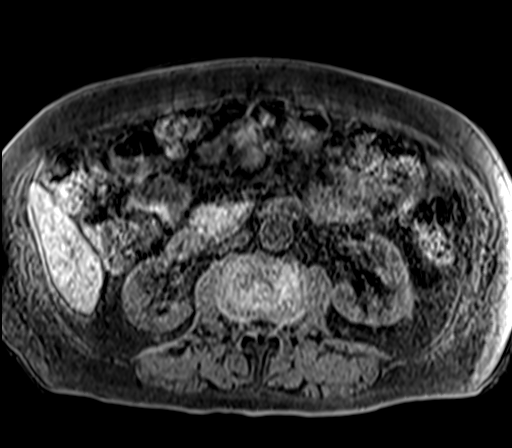
[im 64/96]
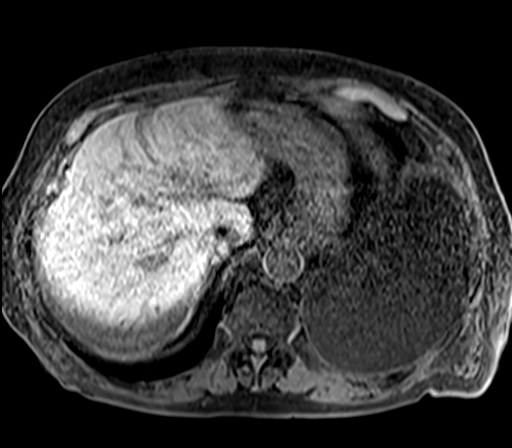
[im 96/96]
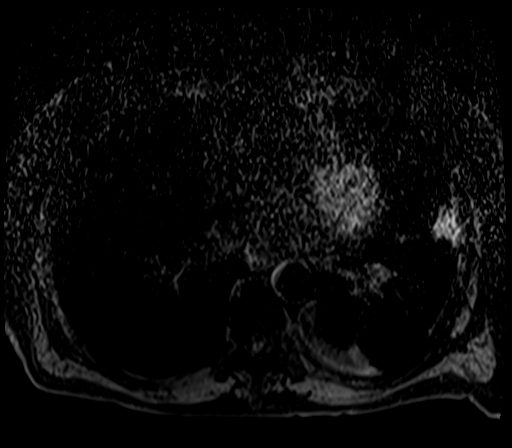

[Series 18: T1 dynamic post-contrast · axial · 2.5mm · 0.70mm/px · z∈[-46,+192]mm · 4 of 96 slices shown (1 of 3)]
[im 1/96]
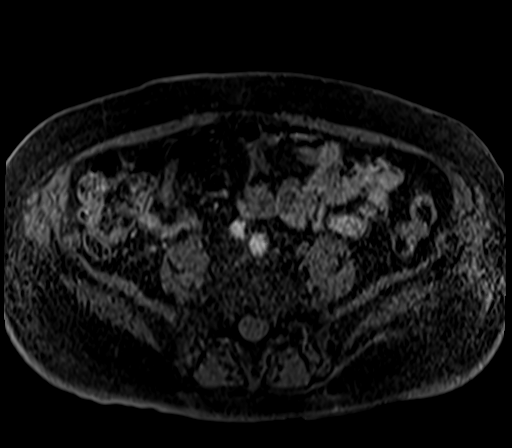
[im 32/96]
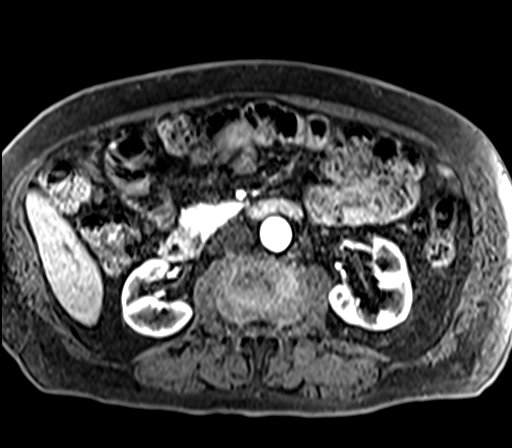
[im 64/96]
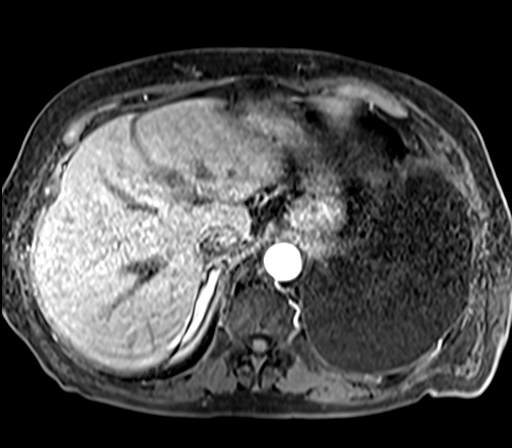
[im 96/96]
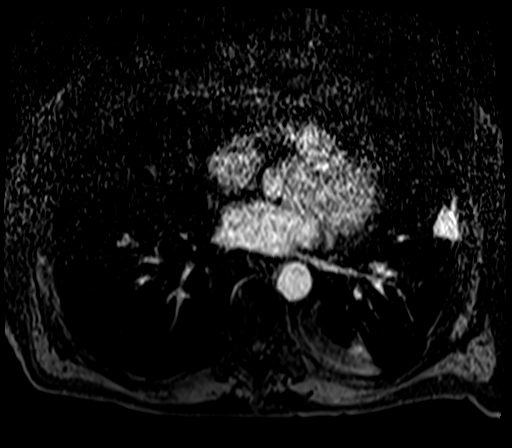

[Series 19: T1 dynamic post-contrast · axial · 2.5mm · 0.70mm/px · z∈[-46,+192]mm · 4 of 96 slices shown (2 of 3)]
[im 1/96]
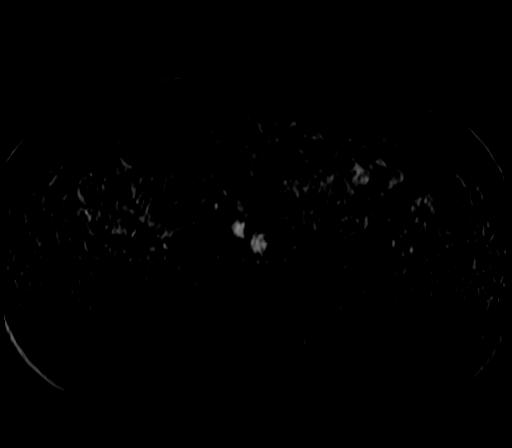
[im 32/96]
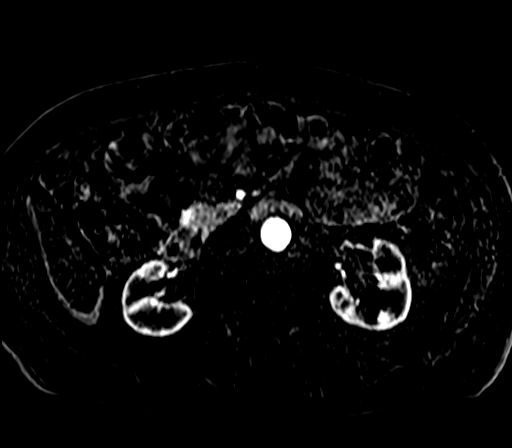
[im 64/96]
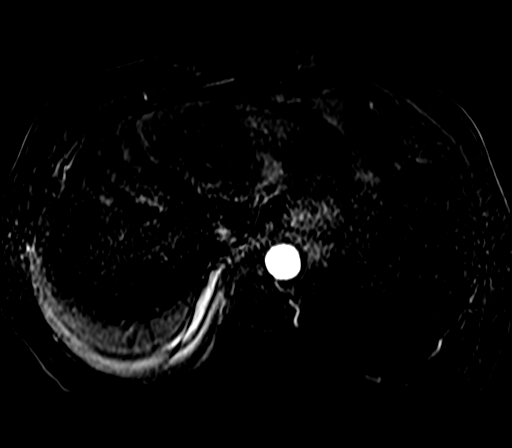
[im 96/96]
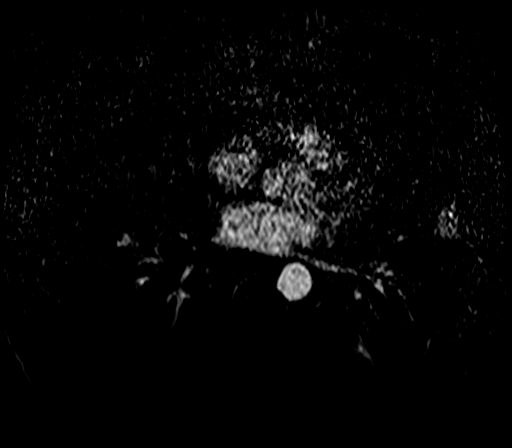

[Series 20: T1 dynamic post-contrast · axial · 2.5mm · 0.70mm/px · z∈[-46,+192]mm · 4 of 96 slices shown (3 of 3)]
[im 1/96]
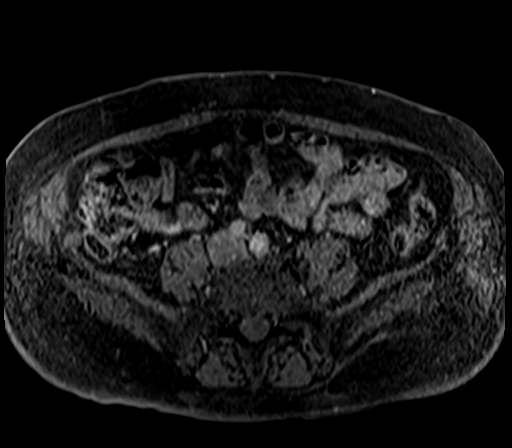
[im 32/96]
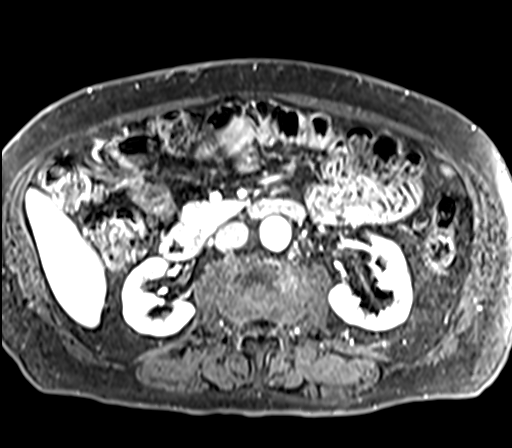
[im 64/96]
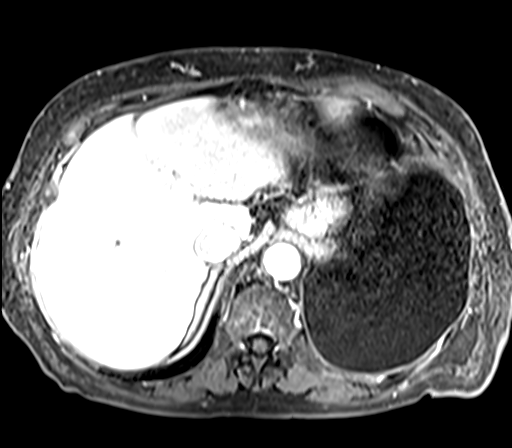
[im 96/96]
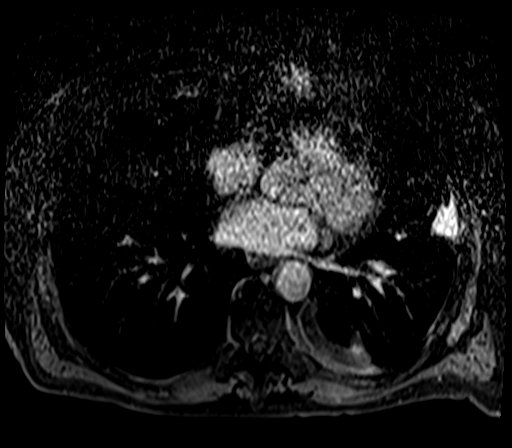

[23 of 48 positions shown; findings below may reference images not displayed]

FINDINGS: Lower chest: Stable small to moderate left pleural effusion.

Hepatobiliary: No hepatic steatosis. No suspicious hepatic lesion.
Gallbladder is unremarkable. No biliary ductal dilation.

Pancreas: Stable size of the stepped dated cystic lesion in the
pancreatic tail with lobular outer contour and communication with
the pancreatic duct now measuring 2.5 x 1.9 cm on image [DATE]
previously 2.5 x 2.0 cm. No significant thickening of the wall or
septations and no solid enhancing components. No pancreatic ductal
dilation. No additional pancreatic lesions.

Spleen:  Within normal limits.

Adrenals/Urinary Tract: Bilateral adrenal glands are unremarkable.
No hydronephrosis. Small bilateral renal cysts measuring up to
cm on the left. No solid enhancing renal masses.

Stomach/Bowel: Visualized portions within the abdomen are
unremarkable.

Vascular/Lymphatic: Stable infrarenal abdominal aortic aneurysm
measuring up to 3.1 cm. No pathologically enlarged abdominal lymph
nodes.

Other:  No abdominal ascites.

Musculoskeletal: No suspicious bone lesions identified. Similar
moderate L3 vertebral body compression deformity.
IMPRESSION: 1. Stable size of the 2.5 cm septated cystic lesion in the
pancreatic tail which demonstrates communication with a nondilated
main pancreatic duct. No high risk MRI features. Most compatible
with a side-branch intraductal papillary mucinous neoplasm. Given
recent previous mild interval growth recommend follow up pre and
post contrast MRI/MRCP or pancreatic protocol CT in 6-12 months.
This recommendation follows ACR consensus guidelines: Management of
Incidental Pancreatic Cysts: A White Paper of the ACR Incidental
Findings Committee. [HOSPITAL] [36];[DATE].
2. Stable small to moderate left pleural effusion.
3. Stable infrarenal abdominal aortic aneurysm measuring up to
cm. Recommend follow-up ultrasound every 3 years. This
recommendation follows ACR consensus guidelines: White Paper of the
ACR Incidental Findings Committee II on Vascular Findings. [HOSPITAL] [36]; [DATE].

## 2020-07-18 MED ORDER — GADOBENATE DIMEGLUMINE 529 MG/ML IV SOLN
15.0000 mL | Freq: Once | INTRAVENOUS | Status: AC | PRN
  Administered 2020-07-18: 15 mL via INTRAVENOUS

## 2020-08-12 ENCOUNTER — Encounter: Payer: Self-pay | Admitting: Family Medicine

## 2020-08-18 ENCOUNTER — Other Ambulatory Visit: Payer: Self-pay | Admitting: Physician Assistant

## 2020-08-22 ENCOUNTER — Other Ambulatory Visit: Payer: Self-pay

## 2020-08-22 ENCOUNTER — Encounter: Payer: Self-pay | Admitting: Family Medicine

## 2020-08-22 ENCOUNTER — Ambulatory Visit (INDEPENDENT_AMBULATORY_CARE_PROVIDER_SITE_OTHER): Payer: PPO | Admitting: Family Medicine

## 2020-08-22 VITALS — BP 118/64 | HR 64 | Temp 98.1°F | Resp 14 | Ht 67.5 in | Wt 176.0 lb

## 2020-08-22 DIAGNOSIS — L723 Sebaceous cyst: Secondary | ICD-10-CM | POA: Diagnosis not present

## 2020-08-22 DIAGNOSIS — B351 Tinea unguium: Secondary | ICD-10-CM | POA: Diagnosis not present

## 2020-08-22 MED ORDER — TERBINAFINE HCL 250 MG PO TABS: 250.0000 mg | ORAL_TABLET | Freq: Every day | ORAL | 3 refills | Status: DC

## 2020-08-22 MED ORDER — ATORVASTATIN CALCIUM 20 MG PO TABS: 20.0000 mg | ORAL_TABLET | Freq: Every day | ORAL | 1 refills | Status: DC

## 2020-08-22 NOTE — Progress Notes (Signed)
Subjective:    Patient ID: [name], male    DOB: January 13, 1934, 87 y.o.   MRN: [id number]  HPI Has small sebecous cyst on the left lower abd.  About 1 cm.  Able to express dry debris from central pore. Right great toenail is yellow and thick and dysmorphic.  Past Medical History:  Diagnosis Date   Acute blood loss anemia    Acute upper respiratory infections of unspecified site 11/04/2012   AKI (acute kidney injury)    Benign prostatic hyperplasia    CAD (coronary artery disease)    s/p cypher DES to pLAD 6/08; normal LVF;  ETT-Myoview 2009: no ischemia    Clavicle fracture 08/10/2019   Closed displaced fracture of phalanx of left thumb, sequela 08/10/2019   Coronary atherosclerosis of native coronary artery 03/08/2008   Eosinophilia    Essential hypertension    Fracture of multiple ribs with pain 08/10/2019   History of multiple falls    History of SAH (subarachnoid hemorrhage) 07/14/2019   Hyperlipidemia type IIB / III 03/08/2008   Major neurocognitive disorder due to possible Alzheimer's disease, without behavioral disturbance 02/29/2020   MI (myocardial infarction)    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   Imaging revealed SAH   Nocturnal leg cramps 11/23/2012   Orthostatic hypotension 03/08/2008   Pain in joint, shoulder region 11/23/2012   Stage 3b chronic kidney disease    Thrombocytopenia    Trigeminal neuralgia    Type 2 diabetes mellitus    Past Surgical History:  Procedure Laterality Date   APPENDECTOMY     CARDIAC CATHETERIZATION  07/30/2006   CORONARY ANGIOPLASTY WITH STENT PLACEMENT   CARDIAC CATHETERIZATION     EXPLORATORY LAPAROTOMY     age 33   EXPLORATORY LAPAROTOMY     IR THORACENTESIS ASP PLEURAL SPACE W/IMG GUIDE  10/05/2019   Current Outpatient Medications on File Prior to Visit  Medication Sig Dispense Refill   albuterol (PROAIR HFA) 108 (90 Base) MCG/ACT inhaler INHALE 2 PUFFS INTO THE LUNGS EVERY 6 HOURS  AS NEEDED FOR WHEEZING OR SHORTNESS OF BREATH (Patient taking differently: Inhale 2 puffs into the lungs every 6 (six) hours as needed for wheezing or shortness of breath.) 6.7 g 0   amLODipine (NORVASC) 5 MG tablet Take 1 tablet (5 mg total) by mouth daily. 90 tablet 3   Apoaequorin (PREVAGEN EXTRA STRENGTH PO) Take 1 tablet by mouth daily.     Calcium Carb-Cholecalciferol (CALCIUM 600+D3 PO) Take 1 tablet by mouth 2 (two) times daily.     doxazosin (CARDURA) 2 MG tablet TAKE 1 TABLET BY MOUTH EVERY DAY (Patient taking differently: Take 2 mg by mouth daily.) 90 tablet 3   escitalopram (LEXAPRO) 10 MG tablet Take 1 tablet (10 mg total) by mouth daily. 90 tablet 2   ezetimibe (ZETIA) 10 MG tablet TAKE 1 TABLET BY MOUTH EVERY DAY (Patient taking differently: Take 10 mg by mouth at bedtime.) 90 tablet 3   fluticasone furoate-vilanterol (BREO ELLIPTA) 200-25 MCG/INH AEPB INHALE 1 PUFF BY MOUTH EVERY DAY 60 each 1   loratadine (CLARITIN) 10 MG tablet Take 10 mg by mouth daily as needed for allergies or rhinitis.      Multiple Vitamin (MULTIVITAMIN WITH MINERALS) TABS tablet Take 1 tablet by mouth daily.     rivastigmine (EXELON) 1.5 MG capsule Take 1 capsule daily for 1 week, then increase to 1 capsule twice a day 60 capsule 6   No current facility-administered  medications on file prior to visit.   Allergies  Allergen Reactions   Gabapentin Other (See Comments)    Visual changes and confusion- "I went blind while I was driving"   Social History   Socioeconomic History   Marital status: Married    Spouse name: [name]   Number of children: 1   Years of education: 20   Highest education level: Occupational hygienist History   Occupation: retired professor    Comment: History    Occupation: missionary  Tobacco Use   Smoking status: Never   Smokeless tobacco: Never  Scientific laboratory technician Use: Never used  Substance and Sexual Activity   Alcohol use: Not Currently   Drug use: Never   Sexual  activity: Yes    Partners: Female  Other Topics Concern   Not on file  Social History Narrative   ** Merged History Encounter **       Lives with his wife (second marriage in 2015, first marriage ended when his wife died alzheimer's disease). Since his remarriage, his adult daughter doesn't speak with him.      Right handed   Social Determinants of Health   Financial Resource Strain: Not on file  Food Insecurity: Not on file  Transportation Needs: Not on file  Physical Activity: Not on file  Stress: Not on file  Social Connections: Not on file  Intimate Partner Violence: Not on file      Review of Systems  All other systems reviewed and are negative.     Objective:   Physical Exam Vitals reviewed.  Constitutional:      Appearance: Normal appearance. He is normal weight.  Cardiovascular:     Rate and Rhythm: Normal rate and regular rhythm.     Heart sounds: Normal heart sounds. No murmur heard. Pulmonary:     Effort: Pulmonary effort is normal.     Breath sounds: Normal breath sounds.  Abdominal:    Musculoskeletal:        General: Deformity present.  Neurological:     Mental Status: He is alert.          Assessment & Plan:   Sebaceous cyst  Onychomycosis Reassure regarding small cyst.  Treat with lamisil 250 mg poqday for 90, check LFT's every 30 days.

## 2020-08-31 ENCOUNTER — Emergency Department (HOSPITAL_COMMUNITY)
Admission: EM | Admit: 2020-08-31 | Discharge: 2020-08-31 | Disposition: A | Discharge status: Home / Self Care | Payer: PPO | Attending: Emergency Medicine | Admitting: Emergency Medicine

## 2020-08-31 ENCOUNTER — Emergency Department (HOSPITAL_COMMUNITY): Payer: PPO

## 2020-08-31 ENCOUNTER — Encounter (HOSPITAL_COMMUNITY): Payer: Self-pay | Admitting: Emergency Medicine

## 2020-08-31 ENCOUNTER — Telehealth: Payer: Self-pay | Admitting: *Deleted

## 2020-08-31 DIAGNOSIS — Z79899 Other long term (current) drug therapy: Secondary | ICD-10-CM | POA: Diagnosis not present

## 2020-08-31 DIAGNOSIS — W19XXXA Unspecified fall, initial encounter: Secondary | ICD-10-CM

## 2020-08-31 DIAGNOSIS — I2510 Atherosclerotic heart disease of native coronary artery without angina pectoris: Secondary | ICD-10-CM | POA: Insufficient documentation

## 2020-08-31 DIAGNOSIS — E1122 Type 2 diabetes mellitus with diabetic chronic kidney disease: Secondary | ICD-10-CM | POA: Diagnosis not present

## 2020-08-31 DIAGNOSIS — R42 Dizziness and giddiness: Secondary | ICD-10-CM | POA: Diagnosis not present

## 2020-08-31 DIAGNOSIS — W01198A Fall on same level from slipping, tripping and stumbling with subsequent striking against other object, initial encounter: Secondary | ICD-10-CM | POA: Diagnosis not present

## 2020-08-31 DIAGNOSIS — S0990XA Unspecified injury of head, initial encounter: Secondary | ICD-10-CM | POA: Diagnosis not present

## 2020-08-31 DIAGNOSIS — N1832 Chronic kidney disease, stage 3b: Secondary | ICD-10-CM | POA: Insufficient documentation

## 2020-08-31 DIAGNOSIS — Y92002 Bathroom of unspecified non-institutional (private) residence single-family (private) house as the place of occurrence of the external cause: Secondary | ICD-10-CM | POA: Diagnosis not present

## 2020-08-31 DIAGNOSIS — I129 Hypertensive chronic kidney disease with stage 1 through stage 4 chronic kidney disease, or unspecified chronic kidney disease: Secondary | ICD-10-CM | POA: Insufficient documentation

## 2020-08-31 LAB — COMPREHENSIVE METABOLIC PANEL
ALT: 21 U/L (ref 0–44)
AST: 37 U/L (ref 15–41)
Albumin: 3.8 g/dL (ref 3.5–5.0)
Alkaline Phosphatase: 100 U/L (ref 38–126)
Anion gap: 6 (ref 5–15)
BUN: 29 mg/dL — ABNORMAL HIGH (ref 8–23)
CO2: 27 mmol/L (ref 22–32)
Calcium: 9.5 mg/dL (ref 8.9–10.3)
Chloride: 102 mmol/L (ref 98–111)
Creatinine, Ser: 1.74 mg/dL — ABNORMAL HIGH (ref 0.61–1.24)
GFR, Estimated: 38 mL/min — ABNORMAL LOW (ref >60)
Glucose, Bld: 87 mg/dL (ref 70–99)
Potassium: 4.4 mmol/L (ref 3.5–5.1)
Sodium: 135 mmol/L (ref 135–145)
Total Bilirubin: 0.6 mg/dL (ref 0.3–1.2)
Total Protein: 6.8 g/dL (ref 6.5–8.1)

## 2020-08-31 LAB — URINALYSIS, ROUTINE W REFLEX MICROSCOPIC
Bacteria, UA: NONE SEEN
Bilirubin Urine: NEGATIVE
Glucose, UA: NEGATIVE mg/dL
Hgb urine dipstick: NEGATIVE
Ketones, ur: NEGATIVE mg/dL
Leukocytes,Ua: NEGATIVE
Nitrite: NEGATIVE
Protein, ur: 100 mg/dL — AB
Specific Gravity, Urine: 1.016 (ref 1.005–1.030)
pH: 6 (ref 5.0–8.0)

## 2020-08-31 LAB — CBC WITH DIFFERENTIAL/PLATELET
Abs Immature Granulocytes: 0.01 10*3/uL (ref 0.00–0.07)
Basophils Absolute: 0 10*3/uL (ref 0.0–0.1)
Basophils Relative: 0 %
Eosinophils Absolute: 0.2 10*3/uL (ref 0.0–0.5)
Eosinophils Relative: 3 %
HCT: 37 % — ABNORMAL LOW (ref 39.0–52.0)
Hemoglobin: 12.1 g/dL — ABNORMAL LOW (ref 13.0–17.0)
Immature Granulocytes: 0 %
Lymphocytes Relative: 13 %
Lymphs Abs: 0.7 10*3/uL (ref 0.7–4.0)
MCH: 31.5 pg (ref 26.0–34.0)
MCHC: 32.7 g/dL (ref 30.0–36.0)
MCV: 96.4 fL (ref 80.0–100.0)
Monocytes Absolute: 0.5 10*3/uL (ref 0.1–1.0)
Monocytes Relative: 9 %
Neutro Abs: 4 10*3/uL (ref 1.7–7.7)
Neutrophils Relative %: 75 %
Platelets: 182 10*3/uL (ref 150–400)
RBC: 3.84 MIL/uL — ABNORMAL LOW (ref 4.22–5.81)
RDW: 12.9 % (ref 11.5–15.5)
WBC: 5.3 10*3/uL (ref 4.0–10.5)
nRBC: 0 % (ref 0.0–0.2)

## 2020-08-31 IMAGING — CT CT HEAD W/O CM
4 series · 17 of 47 positions shown, 19 images · non-contrast
Comparison: None.

CLINICAL DATA: Head trauma, minor

EXAM:
CT HEAD WITHOUT CONTRAST
TECHNIQUE: Contiguous axial images were obtained from the base of the skull
through the vertex without intravenous contrast.

[Series 3: head wo · axial · 0.47mm/px · z∈[-68,+52]mm · 7 of 34 slices shown, 9 images]
[im 5/34  brain]
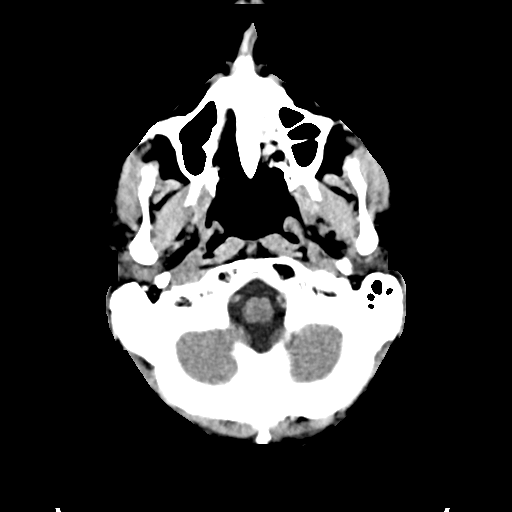
[im 5/34  bone]
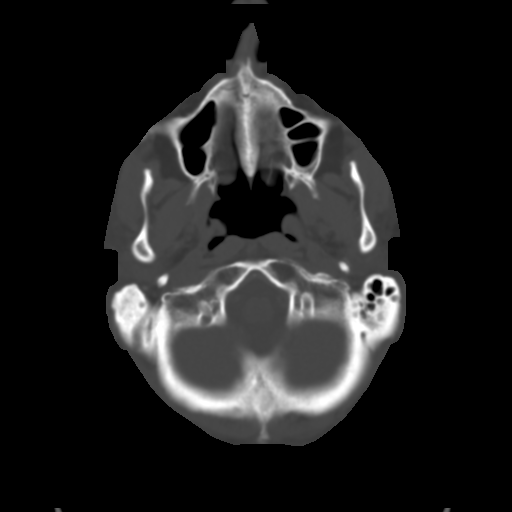
[im 9/34  brain]
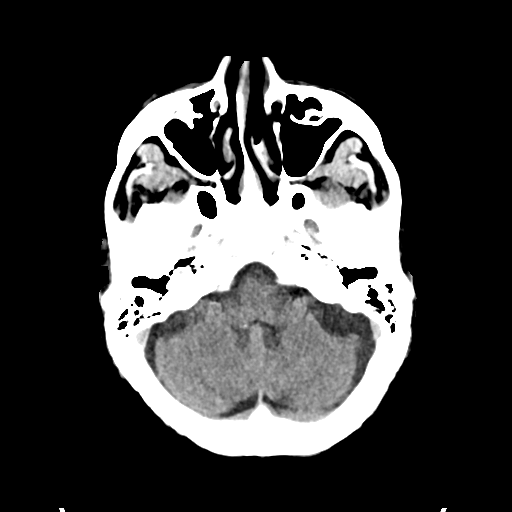
[im 13/34  brain]
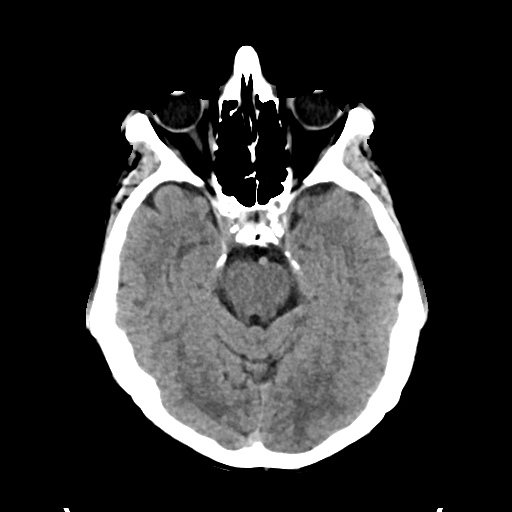
[im 17/34  brain]
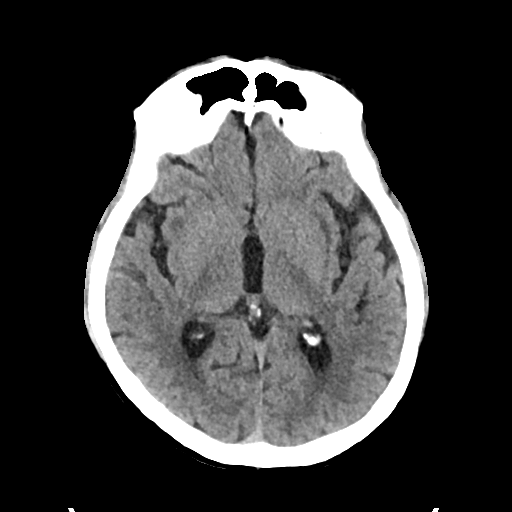
[im 21/34  brain]
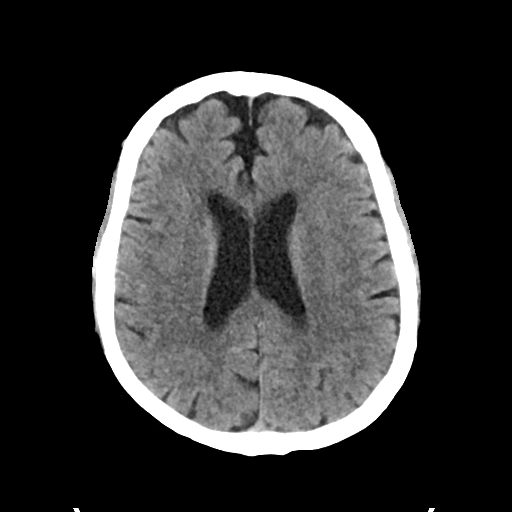
[im 21/34  bone]
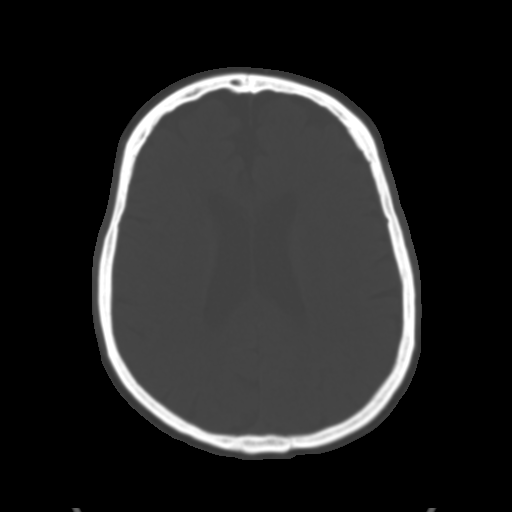
[im 25/34  brain]
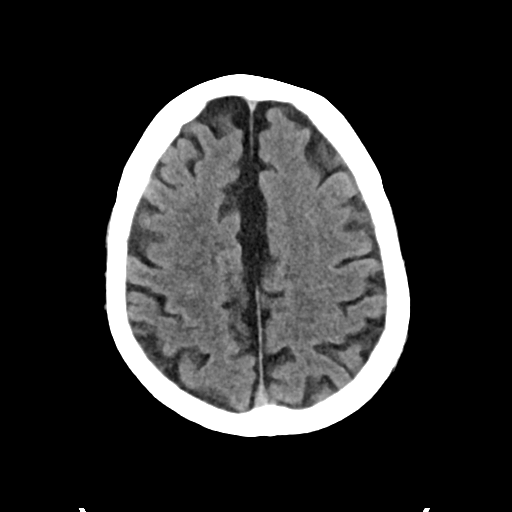
[im 29/34  brain]
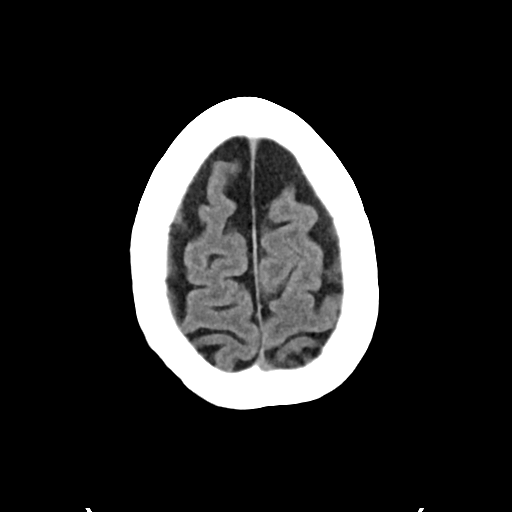

[Series 4: head bone · axial · 0.47mm/px · z∈[-72,-14]mm · 4 of 85 slices shown]
[im 9/85  bone]
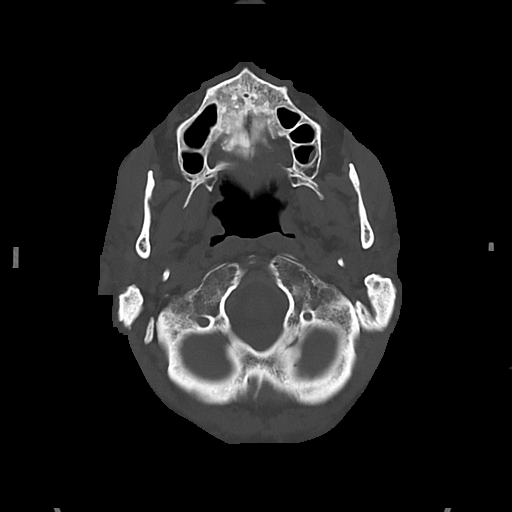
[im 17/85  bone]
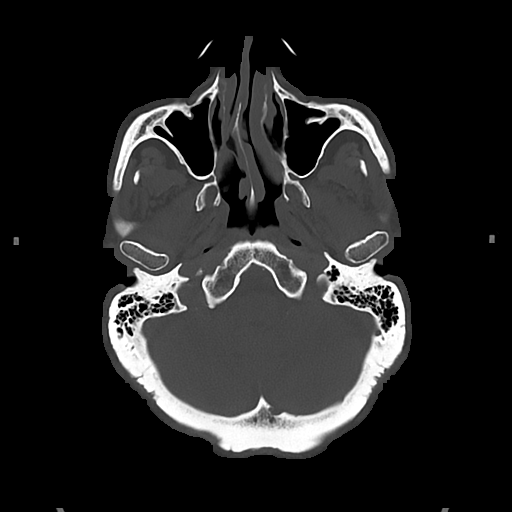
[im 26/85  bone]
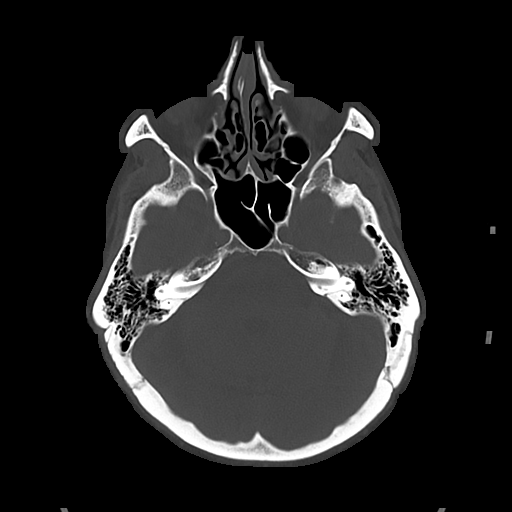
[im 38/85  bone]
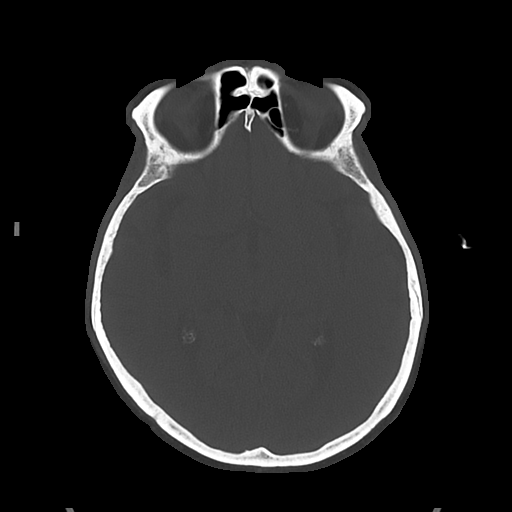

[Series 5: cor soft · coronal · 0.36mm/px · 3 of 76 slices shown]
[im 26/76  brain]
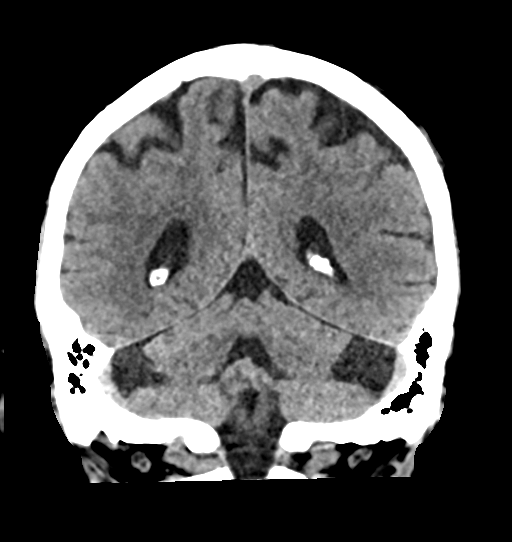
[im 34/76  brain]
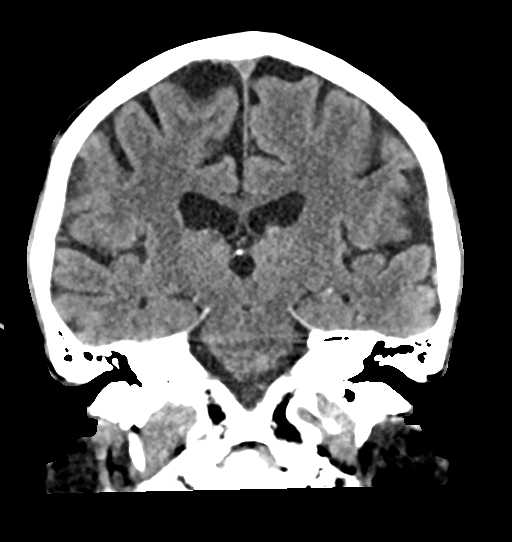
[im 42/76  brain]
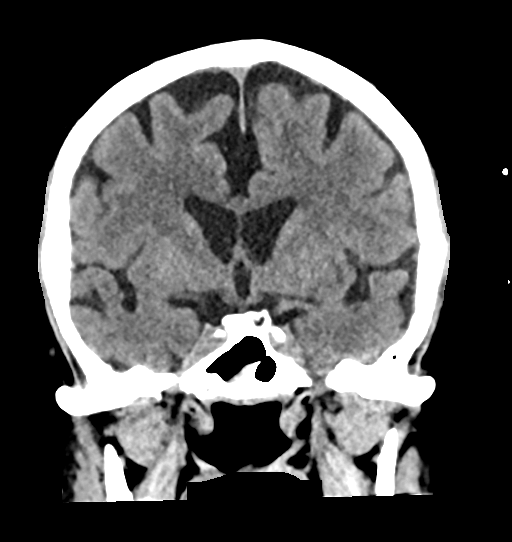

[Series 6: sag soft · sagittal · 0.39mm/px · 3 of 62 slices shown]
[im 21/62  brain]
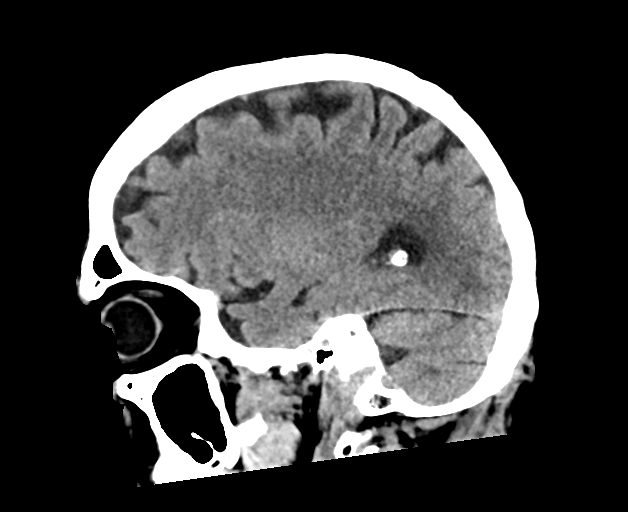
[im 31/62  brain]
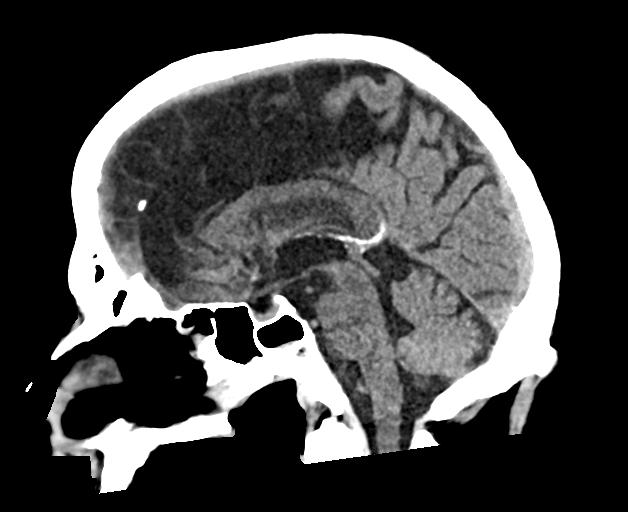
[im 41/62  brain]
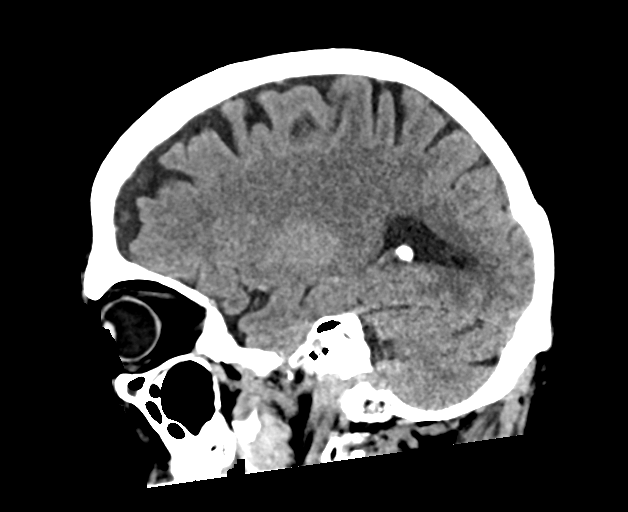

[17 of 47 positions shown; findings below may reference images not displayed]

FINDINGS: Brain: There is no acute intracranial hemorrhage, mass effect, or
edema. Gray-white differentiation is preserved. There is no
extra-axial fluid collection. Prominence of the ventricles sulci
reflects mild generalized parenchymal volume loss. Patchy
low-attenuation in the supratentorial white matter is nonspecific
but may reflect minor chronic microvascular ischemic changes.

Vascular: There is atherosclerotic calcification at the skull base.

Skull: Calvarium is unremarkable.

Sinuses/Orbits: Patchy mucosal thickening.  Orbits are unremarkable.

Other: None.
IMPRESSION: No evidence of acute intracranial injury.

## 2020-08-31 NOTE — Discharge Instructions (Addendum)
Your work-up today in the ED was reassuring, no signs of subdural hemorrhage or any hemorrhage on your CT scan today.  Your lab work-up work-up is reassuring, did show some signs of anemia and kidney impairment which you are aware of.  Continue to follow-up with your primary care physician about this.  We do not see any causes of dizziness in your work-up today, but I would like you to follow-up with your primary care doctor later this week if this is continuing.  If things change or worsen please return back to the ED, specifically start to have vision changes, vomiting, sudden onset of significant headache that does not respond to medicine.

## 2020-08-31 NOTE — ED Provider Notes (Addendum)
Emergency Medicine Provider Triage Evaluation Note  [name] , a 87 y.o. male  was evaluated in triage.  Pt complains of head injury.  He had a fall last night in the bathroom, fell and hit his head on the shower bar.  States he was feeling dizzy at the time he fell, he is dizzy currently.  Went to sleep, woke up this morning feeling worse.  No nausea or vomiting, no vision changes he describes having a "hazy head".  States history of subdural hematoma which is concerning to him.  He is not on any blood thinners.  Review of Systems  Positive: Head injury Negative: Nausea, vomiting, head trauma  Physical Exam  BP (!) 163/65 (BP Location: Left Arm)   Pulse 70   Temp 97.6 F (36.4 C) (Oral)   Resp 15   SpO2 99%  Gen:   Awake, no distress   Resp:  Normal effort  MSK:   Moves extremities without difficulty  Other:  Cranial nerves III through XII are grossly intact.  Grip strength is equal bilaterally.  Medical Decision Making  Medically screening exam initiated at 11:25 AM.  Appropriate orders placed.  Shaquelle A Burback was informed that the remainder of the evaluation will be completed by another provider, this initial triage assessment does not replace that evaluation, and the importance of remaining in the ED until their evaluation is complete.     Sherrill Raring, PA-C 08/31/20 1126    [address], PA-C 08/31/20 1127    Sherrill Raring, PA-C 08/31/20 1455    Elnora Morrison, MD 09/01/20 1723

## 2020-08-31 NOTE — ED Notes (Signed)
DC instructions reviewed with pt. Pt verbalized understanding.  Pt DC 

## 2020-08-31 NOTE — ED Triage Notes (Signed)
Pt was getting off toilet last night at 8 pm and got dizzy and hit top of his head on wall. Reports a hx of subdural. Denies blood thinners. Also endorses feeling "hazy."

## 2020-08-31 NOTE — ED Provider Notes (Signed)
[facility] EMERGENCY DEPARTMENT Provider Note   CSN: [id number] Arrival date & time: 08/31/20  1038     History Chief Complaint  Patient presents with   [name] is a 87 y.o. male.    Patient presents for fall.  Last night he fell forward and hit his head on the curtain rod in his bathroom.  He is unsure if it was because he tripped and fell or if he was feeling dizzy.  He has been having intermittent dizziness for the fall and during triage.  Currently he is not feeling dizzy at all.  He did not lose consciousness, he does not have a headache.  He went to bed, woke up still feeling dizzy.  Patient has a history of subdural hematoma which is concerning to him.  He is not on any blood thinners.  He is not having any nausea or vomiting.   Past Medical History:  Diagnosis Date   Acute blood loss anemia    Acute upper respiratory infections of unspecified site 11/04/2012   AKI (acute kidney injury)    Benign prostatic hyperplasia    CAD (coronary artery disease)    s/p cypher DES to pLAD 6/08; normal LVF;  ETT-Myoview 2009: no ischemia    Clavicle fracture 08/10/2019   Closed displaced fracture of phalanx of left thumb, sequela 08/10/2019   Coronary atherosclerosis of native coronary artery 03/08/2008   Eosinophilia    Essential hypertension    Fracture of multiple ribs with pain 08/10/2019   History of multiple falls    History of SAH (subarachnoid hemorrhage) 07/14/2019   Hyperlipidemia type IIB / III 03/08/2008   Major neurocognitive disorder due to possible Alzheimer's disease, without behavioral disturbance 02/29/2020   MI (myocardial infarction)    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   Imaging revealed SAH   Nocturnal leg cramps 11/23/2012   Orthostatic hypotension 03/08/2008   Pain in joint, shoulder region 11/23/2012   Stage 3b chronic kidney disease    Thrombocytopenia    Trigeminal neuralgia     Type 2 diabetes mellitus     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    Multiple trauma    Benign prostatic hyperplasia    Essential hypertension    Stage 3b chronic kidney disease (HCC)    Thrombocytopenia    History of multiple falls 07/14/2019   History of SAH (subarachnoid hemorrhage) 07/14/2019   Type 2 diabetes mellitus    Eosinophilia    Nocturnal leg cramps 11/23/2012   Pain in joint, shoulder region 11/23/2012   Hematoma 11/04/2012   Hyperlipidemia type IIB / III 03/08/2008   Orthostatic hypotension 03/08/2008   Coronary atherosclerosis of native coronary artery 03/08/2008    Past Surgical History:  Procedure Laterality Date   APPENDECTOMY     CARDIAC CATHETERIZATION  07/30/2006   CORONARY ANGIOPLASTY WITH STENT PLACEMENT   CARDIAC CATHETERIZATION     EXPLORATORY LAPAROTOMY     age 59   EXPLORATORY LAPAROTOMY     IR THORACENTESIS ASP PLEURAL SPACE W/IMG GUIDE  10/05/2019       Family History  Problem Relation Age of Onset   Stomach  cancer Mother    Memory loss Mother    Heart disease Father    Coronary artery disease Father 34   Heart attack Father    Heart disease Brother    Aortic aneurysm Brother    Colon cancer Brother    Aortic aneurysm Brother    Colon cancer Brother    Arthritis Daughter        rheumatoid   Coronary artery disease Brother    Esophageal cancer Neg Hx    Rectal cancer Neg Hx     Social History   Tobacco Use   Smoking status: Never   Smokeless tobacco: Never  Vaping Use   Vaping Use: Never used  Substance Use Topics   Alcohol use: Not Currently   Drug use: Never    Home Medications Prior to Admission medications   Medication  Sig Start Date End Date Taking? Authorizing Provider  albuterol (PROAIR HFA) 108 (90 Base) MCG/ACT inhaler INHALE 2 PUFFS INTO THE LUNGS EVERY 6 HOURS AS NEEDED FOR WHEEZING OR SHORTNESS OF BREATH Patient taking differently: Inhale 2 puffs into the lungs every 6 (six) hours as needed for wheezing or shortness of breath. 08/06/19   Angiulli, Lavon Paganini, PA-C  amLODipine (NORVASC) 5 MG tablet Take 1 tablet (5 mg total) by mouth daily. 11/25/19 11/19/20  Sherren Mocha, MD  Apoaequorin [facility] EXTRA STRENGTH PO) Take 1 tablet by mouth daily.    [provider]  atorvastatin (LIPITOR) 20 MG tablet Take 1 tablet (20 mg total) by mouth daily. 08/22/20   [name], MD  Calcium Carb-Cholecalciferol (CALCIUM 600+D3 PO) Take 1 tablet by mouth 2 (two) times daily.    [provider]  doxazosin (CARDURA) 2 MG tablet TAKE 1 TABLET BY MOUTH EVERY DAY Patient taking differently: Take 2 mg by mouth daily. 11/30/19   [name], MD  escitalopram (LEXAPRO) 10 MG tablet Take 1 tablet (10 mg total) by mouth daily. 07/14/20   [name], MD  ezetimibe (ZETIA) 10 MG tablet TAKE 1 TABLET BY MOUTH EVERY DAY Patient taking differently: Take 10 mg by mouth at bedtime. 11/30/19   Sherren Mocha, MD  fluticasone furoate-vilanterol (BREO ELLIPTA) 200-25 MCG/INH AEPB INHALE 1 PUFF BY MOUTH EVERY DAY 06/10/20   [name], MD  loratadine (CLARITIN) 10 MG tablet Take 10 mg by mouth daily as needed for allergies or rhinitis.     [provider]  Multiple Vitamin (MULTIVITAMIN WITH MINERALS) TABS tablet Take 1 tablet by mouth daily.    [provider]  rivastigmine (EXELON) 1.5 MG capsule Take 1 capsule daily for 1 week, then increase to 1 capsule twice a day 07/08/20   Cameron Sprang, MD  terbinafine (LAMISIL) 250 MG tablet Take 1 tablet (250 mg total) by mouth daily. 08/22/20   [name], MD    Allergies    Gabapentin  Review of Systems   Review of Systems   Constitutional:  Negative for fever.  Eyes:  Negative for visual disturbance.  Gastrointestinal:  Negative for nausea and vomiting.  Genitourinary:  Negative for dysuria.  Neurological:  Positive for dizziness. Negative for syncope, facial asymmetry, speech difficulty, weakness, light-headedness and headaches.   Physical Exam Updated Vital Signs BP (!) 157/65   Pulse 63   Temp 97.7 F (36.5 C) (Oral)   Resp 16   SpO2 95%   Physical Exam Vitals and nursing note reviewed. Exam conducted with a chaperone present.  Constitutional:  Appearance: Normal appearance.  HENT:     Head: Normocephalic.     Comments: No contusions or abrasions to the patient's head or face. Eyes:     General: No scleral icterus.       Right eye: No discharge.        Left eye: No discharge.     Extraocular Movements: Extraocular movements intact.     Pupils: Pupils are equal, round, and reactive to light.     Comments: Pupils are equal bilaterally  Cardiovascular:     Rate and Rhythm: Normal rate and regular rhythm.     Pulses: Normal pulses.     Heart sounds: Normal heart sounds. No murmur heard.   No friction rub. No gallop.  Pulmonary:     Effort: Pulmonary effort is normal. No respiratory distress.     Breath sounds: Normal breath sounds.  Abdominal:     General: Abdomen is flat. Bowel sounds are normal. There is no distension.     Palpations: Abdomen is soft.     Tenderness: There is no abdominal tenderness.  Skin:    General: Skin is warm and dry.     Coloration: Skin is not jaundiced.  Neurological:     Mental Status: He is alert. Mental status is at baseline.     Coordination: Coordination normal.     Comments: Patient is ambulatory.  Cranial nerves II through XII are grossly intact.  Good strength is equal bilaterally, lower extremity strength equal bilaterally.  Extremity strength 5/5 x 4.   ED Results / Procedures / Treatments   Labs (all labs ordered are listed, but only abnormal  results are displayed) Labs Reviewed  COMPREHENSIVE METABOLIC PANEL - Abnormal; Notable for the following components:      Result Value   BUN 29 (*)    Creatinine, Ser 1.74 (*)    GFR, Estimated 38 (*)    All other components within normal limits  CBC WITH DIFFERENTIAL/PLATELET - Abnormal; Notable for the following components:   RBC 3.84 (*)    Hemoglobin 12.1 (*)    HCT 37.0 (*)    All other components within normal limits  URINALYSIS, ROUTINE W REFLEX MICROSCOPIC - Abnormal; Notable for the following components:   APPearance HAZY (*)    Protein, ur 100 (*)    All other components within normal limits    EKG None  Radiology CT Head Wo Contrast  Result Date: 08/31/2020 CLINICAL DATA:  Head trauma, minor EXAM: CT HEAD WITHOUT CONTRAST TECHNIQUE: Contiguous axial images were obtained from the base of the skull through the vertex without intravenous contrast. COMPARISON:  None. FINDINGS: Brain: There is no acute intracranial hemorrhage, mass effect, or edema. Gray-white differentiation is preserved. There is no extra-axial fluid collection. Prominence of the ventricles sulci reflects mild generalized parenchymal volume loss. Patchy low-attenuation in the supratentorial white matter is nonspecific but may reflect minor chronic microvascular ischemic changes. Vascular: There is atherosclerotic calcification at the skull base. Skull: Calvarium is unremarkable. Sinuses/Orbits: Patchy mucosal thickening.  Orbits are unremarkable. Other: None. IMPRESSION: No evidence of acute intracranial injury. Electronically Signed   By: Macy Mis M.D.   On: 08/31/2020 14:23    Procedures Procedures   Medications Ordered in ED Medications - No data to display  ED Course  I have reviewed the triage vital signs and the nursing notes.  Pertinent labs & imaging results that were available during my care of the patient were reviewed by me and considered in  my medical decision making (see chart for  details).  Clinical Course as of 08/31/20 2016  Wed Aug 31, 2020  1451 CT Head Wo Contrast No evidence of an intracranial injury, no SAH, no subdural hemorrhage [HS]  1452 Urinalysis, Routine w reflex microscopic Urine, Clean Catch(!) No signs of UTI, although the patient does have some proteinuria [HS]  1452 CBC with Differential(!) No leukocytosis, mild anemia but appears to be the patient's baseline [HS]  1454 Comprehensive metabolic panel(!) No electrolyte derangement, no elevated LFTs concerning for hepatitis, GFR BUN and creatinine are at baseline. [HS]    Clinical Course User Index [HS] Sherrill Raring, PA-C   MDM Rules/Calculators/A&P                           Please see ED course for interpretation of labs and imaging.  Initially, was concerned about possible subdural hemorrhage or intracranial bleed.  CT does not show any signs of that.  Patient was unable to clearly differentiate between whether he fell because he was dizzy or if he fell because of mechanical fall.  Patient currently is not dizzy, his lab work-up is unrevealing.  I do not suspect a UTI, no signs of arrhythmia on EKG.  Chest x-ray does not show a pneumonia.  There is no gross electrolyte derangement, leukocytosis, anemia.  Patient vitals are stable, he is comfortable, no focal deficits on neuro exam.  I think this point he is appropriate for discharge.  Discussed return precautions with the patient who voiced understanding.    Discussed HPI, physical exam and plan of care for this patient with attending Elnora Morrison. The attending physician evaluated this patient as part of a shared visit and agrees with plan of care.  Final Clinical Impression(s) / ED Diagnoses Final diagnoses:  None    Rx / DC Orders ED Discharge Orders     None        Sherrill Raring, Hershal Coria 08/31/20 2017    Elnora Morrison, MD 09/01/20 1724

## 2020-08-31 NOTE — Telephone Encounter (Signed)
Received call from patient wife, [name].   Reports that patient was noted to be dizzy on 08/30/2020 and fell in bathroom, striking head on towel rack. States that head was struck in the same area as prior subdural hematoma.   Reports that ice was applied at the time of incident.   Patient awoke today reporting sharp stabbing pain in forehead and eye orbit. States that patient is refusing to go to ER d/t wait time.   Advised that patient needs to be seen ASAP and will most likely require imaging. Advised that though there may be a wait time at ER, it will be much faster to have imaging and evaluation done there.   Patient wife agreeable with plan.

## 2020-09-09 ENCOUNTER — Encounter: Payer: Self-pay | Admitting: Neurology

## 2020-09-09 ENCOUNTER — Other Ambulatory Visit: Payer: Self-pay

## 2020-09-09 ENCOUNTER — Ambulatory Visit: Payer: PPO | Admitting: Neurology

## 2020-09-09 VITALS — BP 160/70 | HR 67 | Ht 67.5 in | Wt 176.6 lb

## 2020-09-09 DIAGNOSIS — F0390 Unspecified dementia without behavioral disturbance: Secondary | ICD-10-CM | POA: Diagnosis not present

## 2020-09-09 DIAGNOSIS — R400 Somnolence: Secondary | ICD-10-CM | POA: Diagnosis not present

## 2020-09-09 NOTE — Patient Instructions (Addendum)
Increase Rivastigmine 1.'5mg'$ : Take 1 capsule in AM, 2 capsules in PM for 2 weeks, then increase to 2 capsules twice a day. Send me an update, if no issues, a new prescription for '3mg'$  capsule will be sent.  Schedule home sleep study  3. Schedule repeat Neurocognitive testing, follow-up after evaluation   FALL PRECAUTIONS: Be cautious when walking. Scan the area for obstacles that may increase the risk of trips and falls. When getting up in the mornings, sit up at the edge of the bed for a few minutes before getting out of bed. Consider elevating the bed at the head end to avoid drop of blood pressure when getting up. Walk always in a well-lit room (use night lights in the walls). Avoid area rugs or power cords from appliances in the middle of the walkways. Use a walker or a cane if necessary and consider physical therapy for balance exercise. Get your eyesight checked regularly.   HOME SAFETY: Consider the safety of the kitchen when operating appliances like stoves, microwave oven, and blender. Consider having supervision and share cooking responsibilities until no longer able to participate in those. Accidents with firearms and other hazards in the house should be identified and addressed as well.   ABILITY TO BE LEFT ALONE: If patient is unable to contact 911 operator, consider using LifeLine, or when the need is there, arrange for someone to stay with patients. Smoking is a fire hazard, consider supervision or cessation. Risk of wandering should be assessed by caregiver and if detected at any point, supervision and safe proof recommendations should be instituted.  MEDICATION SUPERVISION: Inability to self-administer medication needs to be constantly addressed. Implement a mechanism to ensure safe administration of the medications.  RECOMMENDATIONS FOR ALL PATIENTS WITH MEMORY PROBLEMS: 1. Continue to exercise (Recommend 30 minutes of walking everyday, or 3 hours every week) 2. Increase social  interactions - continue going to [place] and enjoy social gatherings with friends and family 3. Eat healthy, avoid fried foods and eat more fruits and vegetables 4. Maintain adequate blood pressure, blood sugar, and blood cholesterol level. Reducing the risk of stroke and cardiovascular disease also helps promoting better memory. 5. Avoid stressful situations. Live a simple life and avoid aggravations. Organize your time and prepare for the next day in anticipation. 6. Sleep well, avoid any interruptions of sleep and avoid any distractions in the bedroom that may interfere with adequate sleep quality 7. Avoid sugar, avoid sweets as there is a strong link between excessive sugar intake, diabetes, and cognitive impairment We discussed the Mediterranean diet, which has been shown to help patients reduce the risk of progressive memory disorders and reduces cardiovascular risk. This includes eating fish, eat fruits and green leafy vegetables, nuts like almonds and hazelnuts, walnuts, and also use olive oil. Avoid fast foods and fried foods as much as possible. Avoid sweets and sugar as sugar use has been linked to worsening of memory function.  There is always a concern of gradual progression of memory problems. If this is the case, then we may need to adjust level of care according to patient needs. Support, both to the patient and caregiver, should then be put into place.       Mediterranean Diet  Why follow it? Research shows. Those who follow the Mediterranean diet have a reduced risk of heart disease  The diet is associated with a reduced incidence of Parkinson's and Alzheimer's diseases People following the diet may have longer life expectancies and lower rates  of chronic diseases  The Dietary Guidelines for Americans recommends the Mediterranean diet as an eating plan to promote health and prevent disease  What Is the Mediterranean Diet?  Healthy eating plan based on typical foods and recipes of  Mediterranean-style cooking The diet is primarily a plant based diet; these foods should make up a majority of meals   Starches - Plant based foods should make up a majority of meals - They are an important sources of vitamins, minerals, energy, antioxidants, and fiber - Choose whole grains, foods high in fiber and minimally processed items  - Typical grain sources include wheat, oats, barley, corn, brown rice, bulgar, farro, millet, polenta, couscous  - Various types of beans include chickpeas, lentils, fava beans, black beans, white beans   Fruits  Veggies - Large quantities of antioxidant rich fruits & veggies; 6 or more servings  - Vegetables can be eaten raw or lightly drizzled with oil and cooked  - Vegetables common to the traditional Mediterranean Diet include: artichokes, arugula, beets, broccoli, brussel sprouts, cabbage, carrots, celery, collard greens, cucumbers, eggplant, kale, leeks, lemons, lettuce, mushrooms, okra, onions, peas, peppers, potatoes, pumpkin, radishes, rutabaga, shallots, spinach, sweet potatoes, turnips, zucchini - Fruits common to the Mediterranean Diet include: apples, apricots, avocados, cherries, clementines, dates, figs, grapefruits, grapes, melons, nectarines, oranges, peaches, pears, pomegranates, strawberries, tangerines  Fats - Replace butter and margarine with healthy oils, such as olive oil, canola oil, and tahini  - Limit nuts to no more than a handful a day  - Nuts include walnuts, almonds, pecans, pistachios, pine nuts  - Limit or avoid candied, honey roasted or heavily salted nuts - Olives are central to the [employer] - can be eaten whole or used in a variety of dishes   Meats Protein - Limiting red meat: no more than a few times a month - When eating red meat: choose lean cuts and keep the portion to the size of deck of cards - Eggs: approx. 0 to 4 times a week  - Fish and lean poultry: at least 2 a week  - Healthy protein sources include,  chicken, [place], lean beef, lamb - Increase intake of seafood such as tuna, salmon, trout, mackerel, shrimp, scallops - Avoid or limit high fat processed meats such as sausage and bacon  Dairy - Include moderate amounts of low fat dairy products  - Focus on healthy dairy such as fat free yogurt, skim milk, low or reduced fat cheese - Limit dairy products higher in fat such as whole or 2% milk, cheese, ice cream  Alcohol - Moderate amounts of red wine is ok  - No more than 5 oz daily for women (all ages) and men older than age 100  - No more than 10 oz of wine daily for men younger than 38  Other - Limit sweets and other desserts  - Use herbs and spices instead of salt to flavor foods  - Herbs and spices common to the traditional Mediterranean Diet include: basil, bay leaves, chives, cloves, cumin, fennel, garlic, lavender, marjoram, mint, oregano, parsley, pepper, rosemary, sage, savory, sumac, tarragon, thyme   It's not just a diet, it's a lifestyle:  The Mediterranean diet includes lifestyle factors typical of those in the region  Foods, drinks and meals are best eaten with others and savored Daily physical activity is important for overall good health This could be strenuous exercise like running and aerobics This could also be more leisurely activities such as walking, housework, yard-work,  or taking the stairs Moderation is the key; a balanced and healthy diet accommodates most foods and drinks Consider portion sizes and frequency of consumption of certain foods   Meal Ideas & Options:  Breakfast:  Whole wheat toast or whole wheat English muffins with peanut butter & hard boiled egg Steel cut oats topped with apples & cinnamon and skim milk  Fresh fruit: banana, strawberries, melon, berries, peaches  Smoothies: strawberries, bananas, greek yogurt, peanut butter Low fat greek yogurt with blueberries and granola  Egg white omelet with spinach and mushrooms Breakfast couscous: whole  wheat couscous, apricots, skim milk, cranberries  Sandwiches:  Hummus and grilled vegetables (peppers, zucchini, squash) on whole wheat bread   Grilled chicken on whole wheat pita with lettuce, tomatoes, cucumbers or tzatziki  [place] salad on whole wheat bread: tuna salad made with greek yogurt, olives, red peppers, capers, green onions Garlic rosemary lamb pita: lamb sauted with garlic, rosemary, salt & pepper; add lettuce, cucumber, greek yogurt to pita - flavor with lemon juice and black pepper  Seafood:  Mediterranean grilled salmon, seasoned with garlic, basil, parsley, lemon juice and black pepper Shrimp, lemon, and spinach whole-grain pasta salad made with low fat greek yogurt  Seared scallops with lemon orzo  Seared tuna steaks seasoned salt, pepper, coriander topped with tomato mixture of olives, tomatoes, olive oil, minced garlic, parsley, green onions and cappers  Meats:  Herbed greek chicken salad with kalamata olives, cucumber, feta  Red bell peppers stuffed with spinach, bulgur, lean ground beef (or lentils) & topped with feta   Kebabs: skewers of chicken, tomatoes, onions, zucchini, squash  [place] burgers: made with red onions, mint, dill, lemon juice, feta cheese topped with roasted red peppers Vegetarian Cucumber salad: cucumbers, artichoke hearts, celery, red onion, feta cheese, tossed in olive oil & lemon juice  Hummus and whole grain pita points with a greek salad (lettuce, tomato, feta, olives, cucumbers, red onion) Lentil soup with celery, carrots made with vegetable broth, garlic, salt and pepper  Tabouli salad: parsley, bulgur, mint, scallions, cucumbers, tomato, radishes, lemon juice, olive oil, salt and pepper.

## 2020-09-09 NOTE — Progress Notes (Signed)
NEUROLOGY FOLLOW UP OFFICE NOTE  [name] [id number] [birth year]  HISTORY OF PRESENT ILLNESS: I had the pleasure of seeing [name] in follow-up in the neurology clinic on 09/09/2020.  The patient was last seen 5 months ago for Alzheimer's disease. He is again accompanied by his wife [name] who helps supplement the history today.  Records and images were personally reviewed where available. Since his last visit, he was switched to Rivastigmine due to side effects of headaches, confusion, nausea, vivid dreams on Donepezil. [name] had contacted our office that he was waking up with a "hollow" head and night sweats (which started before med initiation), dose reduced to once a day, then increased back to BID once night sweats improved. [name] sent a message end of June that he had nausea, gagging, black stools, dizziness. He has a home health nurse coming regularly which managed this. On 08/30/20 he fell and hit his head, the next day he had sharp stabbing pain in the forehead and orbit. He went to the ER where head CT did not show any acute changes.   He is overall tolerating Rivastigmine 1.'5mg'$  BID better. There are still mornings he would say he has an "empty head" or "hollow head," attributing it to similar dreams he has at night. No further significant night sweats or nausea. [name] has noticed that he jerks in his sleep more, sometimes he is kicking and dreaming he is playing soccer. There was one time he had involuntary muscle movements while still awake getting ready for bed. He has daytime drowsiness and snores. He has more tremors when holding his coffee cup. Since the fall, he has been stumbling a little more. He uses his walker outdoors and at night, sometimes he uses his cane for balance at home during the day. There was only one episode mid-May when he was out of his routine, he got a little upset thinking [name] was going to have a party with other men in the house. She was able to calm  him down and they laughed about it after. He does not remember this and became upset that he said this last May.    History on Initial Assessment 03/10/2020: This is a pleasant 87 year old right-handed man with a history of hypertension, CAD, diabetes, hyperlipidemia, presenting for management of Alzheimer's disease. He underwent Neuropsychological testing in last month with findings concerning for AD. Records were reviewed. He had an unwitnessed fall in June 2021 and sustained several fractures and a subarachnoid hemorrhage on the left that did not require surgical intervention. He started reporting forgetfulness at that time. He was discharged home but back in the hospital a month later with headache, low back pain and fever due to UTI. Head CT showed a left hemispheric subdural hemorrhage, new from prior CT, resolved on follow-up CT in September 2021. He had another fall in November 2021 when he missed a step. He was back in the ER a few weeks later due to increasing confusion and forgetfulness, frequent right-sided headaches, and a clicking noise in his neck, attributed to his recent fall. He was back in the ER again in January 2022 for dizziness, worse when he moves quickly or gets up too quickly, leading to falls. 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 Vestibular therapy helped with dizziness. Neuropsychological evaluation indicated prominent impairment surrounding all aspects of learning and memory, meeting  criteria for Major Neurocognitive Disorder, mild end of spectrum, etiology concerning for Alzheimer's disease. There may be a vascular component as well. Prior TBI exacerbated deficits that were already present rather than being primarily responsible for dysfunction.   They report his long-term memory is good, he can remember long passages from [place]. [name] reports some  memory issues prior to the fall, for instance he would recall a story differently or he could not keep an appointment date in his mind or a prior decision they had made. He has not been driving. He manages his own pillbox with a good system, [name] checks behind him and notes he makes mistakes sometimes. He denies any difficulties managing finances, [name] has been managing them since the fall in June and notes there was a notification from the bank prior to his fall. He is fairly independent, [name] stays close during dressing and bathing mostly for safety purposes. He gets dizzy the most when bending down. He uses a cane all the time. He states mood is not bad, "I'm not miserable." A few times he would ask their daughter where her mother or when [name] is coming back when she is right there. He thinks the daughter who lives with them is a boy, using male pronouns for her, which is new. There have been times he thinks there is a second cat or the cat is a dog, mostly at night. He used to think they had 2 bedrooms.   He denies any headaches. He has occasional vertigo and feels like he would fall, last fall was in January. Dizziness mostly occurs when he gets up, but he has also reported it while sitting or supine. It lasts at most 2 minutes, no nausea/vomiting, vision changes. The other night he woke up and saw nothing except for 2 black rings that he described as full black moons, this has happened twice. He has left trigeminal neuralgia. He was having orthostatic hypotension initially after the fall and they had to stop PT. BP has improved but he still has occasional dizziness. No focal numbness/tingling/weakness. He has neck pain and notes a "clicking" in his neck. Cervical xray showed cervical spondylosis, trace grade 1 anterolisthesis at C2-3, C3-4, C5-6, and C6-7. He has low back pain. No bowel/bladder dysfunction. No anosmia but sometimes smells food when there is none, usually at night. He has mild hand  tremors. He has occasional daytime drowsiness, sleeping on his back due to pain, however [name] notes he sounds like he is gasping for breath. He snores sometimes.   Diagnostic Data:   Neuropsychological evaluation in 02/29/2020 with a diagnosis of Major Neurocognitive Disorder, etiology concerning for late-onset Alzheimer's disease. "There was prominent impairment surrounding all aspects of learning and memory. Additional impairments were seen across aspects of executive functioning (i.e., cognitive flexibility and response inhibition), while performance variability was exhibited across attention/concentration, expressive language, and visuospatial abilities. Regarding his prior TBI, it is more likely that the presence of this injury exacerbated deficits that were already present and worsening due to an underlying cognitive disorder rather than this injury being primarily responsible for the aforementioned dysfunction. His pattern of performance is inconsistent with what would be expected from his TBI alone with memory dysfunction being far more severe. Repeat testing recommended in 12-18 months."  MRI brain with and without contrast done 04/2020 did not show any acute changes. 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   His  exam on initial visit also showed hyperreflexia and fasciculations, and he was reporting neck pain. MRI cervical spine did not show any cord abnormalities, there was mild to moderate spinal stenosis and foraminal stenosis at several levels.   EMG/NCV was normal, no evidence of motor neuron disease.    PAST MEDICAL HISTORY: Past Medical History:  Diagnosis Date   Acute blood loss anemia    Acute upper respiratory infections of unspecified site 11/04/2012   AKI (acute kidney injury)    Benign prostatic hyperplasia    CAD (coronary artery disease)    s/p cypher DES to pLAD  6/08; normal LVF;  ETT-Myoview 2009: no ischemia    Clavicle fracture 08/10/2019   Closed displaced fracture of phalanx of left thumb, sequela 08/10/2019   Coronary atherosclerosis of native coronary artery 03/08/2008   Eosinophilia    Essential hypertension    Fracture of multiple ribs with pain 08/10/2019   History of multiple falls    History of SAH (subarachnoid hemorrhage) 07/14/2019   Hyperlipidemia type IIB / III 03/08/2008   Major neurocognitive disorder due to possible Alzheimer's disease, without behavioral disturbance 02/29/2020   MI (myocardial infarction)    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   Imaging revealed SAH   Nocturnal leg cramps 11/23/2012   Orthostatic hypotension 03/08/2008   Pain in joint, shoulder region 11/23/2012   Stage 3b chronic kidney disease    Thrombocytopenia    Trigeminal neuralgia    Type 2 diabetes mellitus     MEDICATIONS: Current Outpatient Medications on File Prior to Visit  Medication Sig Dispense Refill   albuterol (PROAIR HFA) 108 (90 Base) MCG/ACT inhaler INHALE 2 PUFFS INTO THE LUNGS EVERY 6 HOURS AS NEEDED FOR WHEEZING OR SHORTNESS OF BREATH (Patient taking differently: Inhale 2 puffs into the lungs every 6 (six) hours as needed for wheezing or shortness of breath.) 6.7 g 0   amLODipine (NORVASC) 5 MG tablet Take 1 tablet (5 mg total) by mouth daily. 90 tablet 3   Apoaequorin (PREVAGEN EXTRA STRENGTH PO) Take 1 tablet by mouth daily.     atorvastatin (LIPITOR) 20 MG tablet Take 1 tablet (20 mg total) by mouth daily. 90 tablet 1   Calcium Carb-Cholecalciferol (CALCIUM 600+D3 PO) Take 1 tablet by mouth 2 (two) times daily.     doxazosin (CARDURA) 2 MG tablet TAKE 1 TABLET BY MOUTH EVERY DAY (Patient taking differently: Take 2 mg by mouth daily.) 90 tablet 3   escitalopram (LEXAPRO) 10 MG tablet Take 1 tablet (10 mg total) by mouth daily. 90 tablet 2   ezetimibe (ZETIA) 10 MG tablet TAKE 1 TABLET BY MOUTH  EVERY DAY (Patient taking differently: Take 10 mg by mouth at bedtime.) 90 tablet 3   fluticasone furoate-vilanterol (BREO ELLIPTA) 200-25 MCG/INH AEPB INHALE 1 PUFF BY MOUTH EVERY DAY 60 each 1   loratadine (CLARITIN) 10 MG tablet Take 10 mg by mouth daily as needed for allergies or rhinitis.      Multiple Vitamin (MULTIVITAMIN WITH MINERALS) TABS tablet Take 1 tablet by mouth daily.     rivastigmine (EXELON) 1.5 MG capsule Take 1 capsule daily for 1 week, then increase to 1 capsule twice a day 60 capsule 6   terbinafine (LAMISIL) 250 MG tablet Take 1 tablet (250 mg total) by mouth daily. 90 tablet 3   No current facility-administered medications on file prior to visit.    ALLERGIES: Allergies  Allergen Reactions   Gabapentin Other (See Comments)  Visual changes and confusion- "I went blind while I was driving"    FAMILY HISTORY: Family History  Problem Relation Age of Onset   Stomach cancer Mother    Memory loss Mother    Heart disease Father    Coronary artery disease Father 71   Heart attack Father    Heart disease Brother    Aortic aneurysm Brother    Colon cancer Brother    Aortic aneurysm Brother    Colon cancer Brother    Arthritis Daughter        rheumatoid   Coronary artery disease Brother    Esophageal cancer Neg Hx    Rectal cancer Neg Hx     SOCIAL HISTORY: Social History   Socioeconomic History   Marital status: Married    Spouse name: [name]   Number of children: 1   Years of education: 48   Highest education level: Occupational hygienist History   Occupation: retired professor    Comment: History    Occupation: missionary  Tobacco Use   Smoking status: Never   Smokeless tobacco: Never  Scientific laboratory technician Use: Never used  Substance and Sexual Activity   Alcohol use: Not Currently   Drug use: Never   Sexual activity: Yes    Partners: Female  Other Topics Concern   Not on file  Social History Narrative   ** Merged History Encounter **        Lives with his wife (second marriage in 2015, first marriage ended when his wife died alzheimer's disease). Since his remarriage, his adult daughter doesn't speak with him.      Right handed   Social Determinants of Health   Financial Resource Strain: Not on file  Food Insecurity: Not on file  Transportation Needs: Not on file  Physical Activity: Not on file  Stress: Not on file  Social Connections: Not on file  Intimate Partner Violence: Not on file     PHYSICAL EXAM: Vitals:   09/09/20 0942  BP: (!) 160/70  Pulse: 67  SpO2: 98%   General: No acute distress Head:  Normocephalic/atraumatic Skin/Extremities: No rash, no edema Neurological Exam: alert and oriented to person, place. Season. States it is October 2011. No aphasia or dysarthria. Fund of knowledge is appropriate.  Recent and remote memory are impaired. Attention and concentration are normal. MMSE 21/30 MMSE - Mini Mental State Exam 09/09/2020  Orientation to time 1  Orientation to Place 5  Registration 3  Attention/ Calculation 5  Recall 0  Language- name 2 objects 2  Language- repeat 1  Language- follow 3 step command 2  Language- read & follow direction 1  Write a sentence 1  Copy design 0  Total score 21   Cranial nerves: Pupils equal, round. Extraocular movements intact with no nystagmus. Visual fields full.  No facial asymmetry.  Motor: Bulk and tone normal, muscle strength 5/5 throughout with no pronator drift.   Finger to nose testing intact.  Gait slightly wide-based, no ataxia.    IMPRESSION: This is a pleasant 87 yo RH man with a history of hypertension, CAD, diabetes, hyperlipidemia, with Neuropsychological evaluation in January 2022 felt most consistent with mild Alzheimer's disease, repeat testing was recommended in 12-18 months. MRI brain showed mild diffuse atrophy and chronic microvascular disease. MMSE today 21/30. He is tolerating Rivastigmine better, increase slowly to '3mg'$  BID, they will let  us know if no issues and refills will be sent. He continues to report "empty/hollow  head" in the mornings, he has daytime drowsiness and snoring, home sleepy study will be ordered to assess for sleep apnea. We again discussed the importance of control of vascular risk factors, physical and brain stimulation exercises, and MIND diet for overall brain health. Follow-up in 6 months (after repeat testing), call for any changes.    Thank you for allowing me to participate in his care.  Please do not hesitate to call for any questions or concerns.    Ellouise Newer, M.D.   CC: Dr. Dennard Schaumann

## 2020-09-18 ENCOUNTER — Other Ambulatory Visit: Payer: Self-pay | Admitting: Family Medicine

## 2020-09-22 ENCOUNTER — Encounter: Payer: Self-pay | Admitting: Psychology

## 2020-09-24 ENCOUNTER — Emergency Department (HOSPITAL_BASED_OUTPATIENT_CLINIC_OR_DEPARTMENT_OTHER)
Admission: EM | Admit: 2020-09-24 | Discharge: 2020-09-24 | Disposition: A | Discharge status: Home / Self Care | Payer: PPO | Attending: Emergency Medicine | Admitting: Emergency Medicine

## 2020-09-24 ENCOUNTER — Encounter (HOSPITAL_BASED_OUTPATIENT_CLINIC_OR_DEPARTMENT_OTHER): Payer: Self-pay

## 2020-09-24 ENCOUNTER — Emergency Department (HOSPITAL_BASED_OUTPATIENT_CLINIC_OR_DEPARTMENT_OTHER): Payer: PPO | Admitting: Radiology

## 2020-09-24 ENCOUNTER — Emergency Department (HOSPITAL_BASED_OUTPATIENT_CLINIC_OR_DEPARTMENT_OTHER): Payer: PPO

## 2020-09-24 DIAGNOSIS — E1122 Type 2 diabetes mellitus with diabetic chronic kidney disease: Secondary | ICD-10-CM | POA: Insufficient documentation

## 2020-09-24 DIAGNOSIS — I129 Hypertensive chronic kidney disease with stage 1 through stage 4 chronic kidney disease, or unspecified chronic kidney disease: Secondary | ICD-10-CM | POA: Diagnosis not present

## 2020-09-24 DIAGNOSIS — I2510 Atherosclerotic heart disease of native coronary artery without angina pectoris: Secondary | ICD-10-CM | POA: Insufficient documentation

## 2020-09-24 DIAGNOSIS — M25512 Pain in left shoulder: Secondary | ICD-10-CM | POA: Diagnosis not present

## 2020-09-24 DIAGNOSIS — W19XXXA Unspecified fall, initial encounter: Secondary | ICD-10-CM

## 2020-09-24 DIAGNOSIS — N1832 Chronic kidney disease, stage 3b: Secondary | ICD-10-CM | POA: Diagnosis not present

## 2020-09-24 DIAGNOSIS — S0003XA Contusion of scalp, initial encounter: Secondary | ICD-10-CM | POA: Diagnosis not present

## 2020-09-24 DIAGNOSIS — W01198A Fall on same level from slipping, tripping and stumbling with subsequent striking against other object, initial encounter: Secondary | ICD-10-CM | POA: Diagnosis not present

## 2020-09-24 DIAGNOSIS — S7002XA Contusion of left hip, initial encounter: Secondary | ICD-10-CM | POA: Insufficient documentation

## 2020-09-24 DIAGNOSIS — S40012A Contusion of left shoulder, initial encounter: Secondary | ICD-10-CM | POA: Diagnosis not present

## 2020-09-24 DIAGNOSIS — Y92009 Unspecified place in unspecified non-institutional (private) residence as the place of occurrence of the external cause: Secondary | ICD-10-CM | POA: Insufficient documentation

## 2020-09-24 DIAGNOSIS — G309 Alzheimer's disease, unspecified: Secondary | ICD-10-CM | POA: Insufficient documentation

## 2020-09-24 DIAGNOSIS — S0990XA Unspecified injury of head, initial encounter: Secondary | ICD-10-CM | POA: Diagnosis present

## 2020-09-24 DIAGNOSIS — J90 Pleural effusion, not elsewhere classified: Secondary | ICD-10-CM | POA: Diagnosis not present

## 2020-09-24 DIAGNOSIS — R519 Headache, unspecified: Secondary | ICD-10-CM | POA: Diagnosis not present

## 2020-09-24 DIAGNOSIS — Z79899 Other long term (current) drug therapy: Secondary | ICD-10-CM | POA: Insufficient documentation

## 2020-09-24 IMAGING — DX DG RIBS W/ CHEST 3+V*L*
2 series · 3 of 3 positions shown · non-contrast
Comparison: [DATE]

CLINICAL DATA: Fall with third rib fracture noted on shoulder x-ray

EXAM:
LEFT RIBS AND CHEST - 3+ VIEW

[chest]
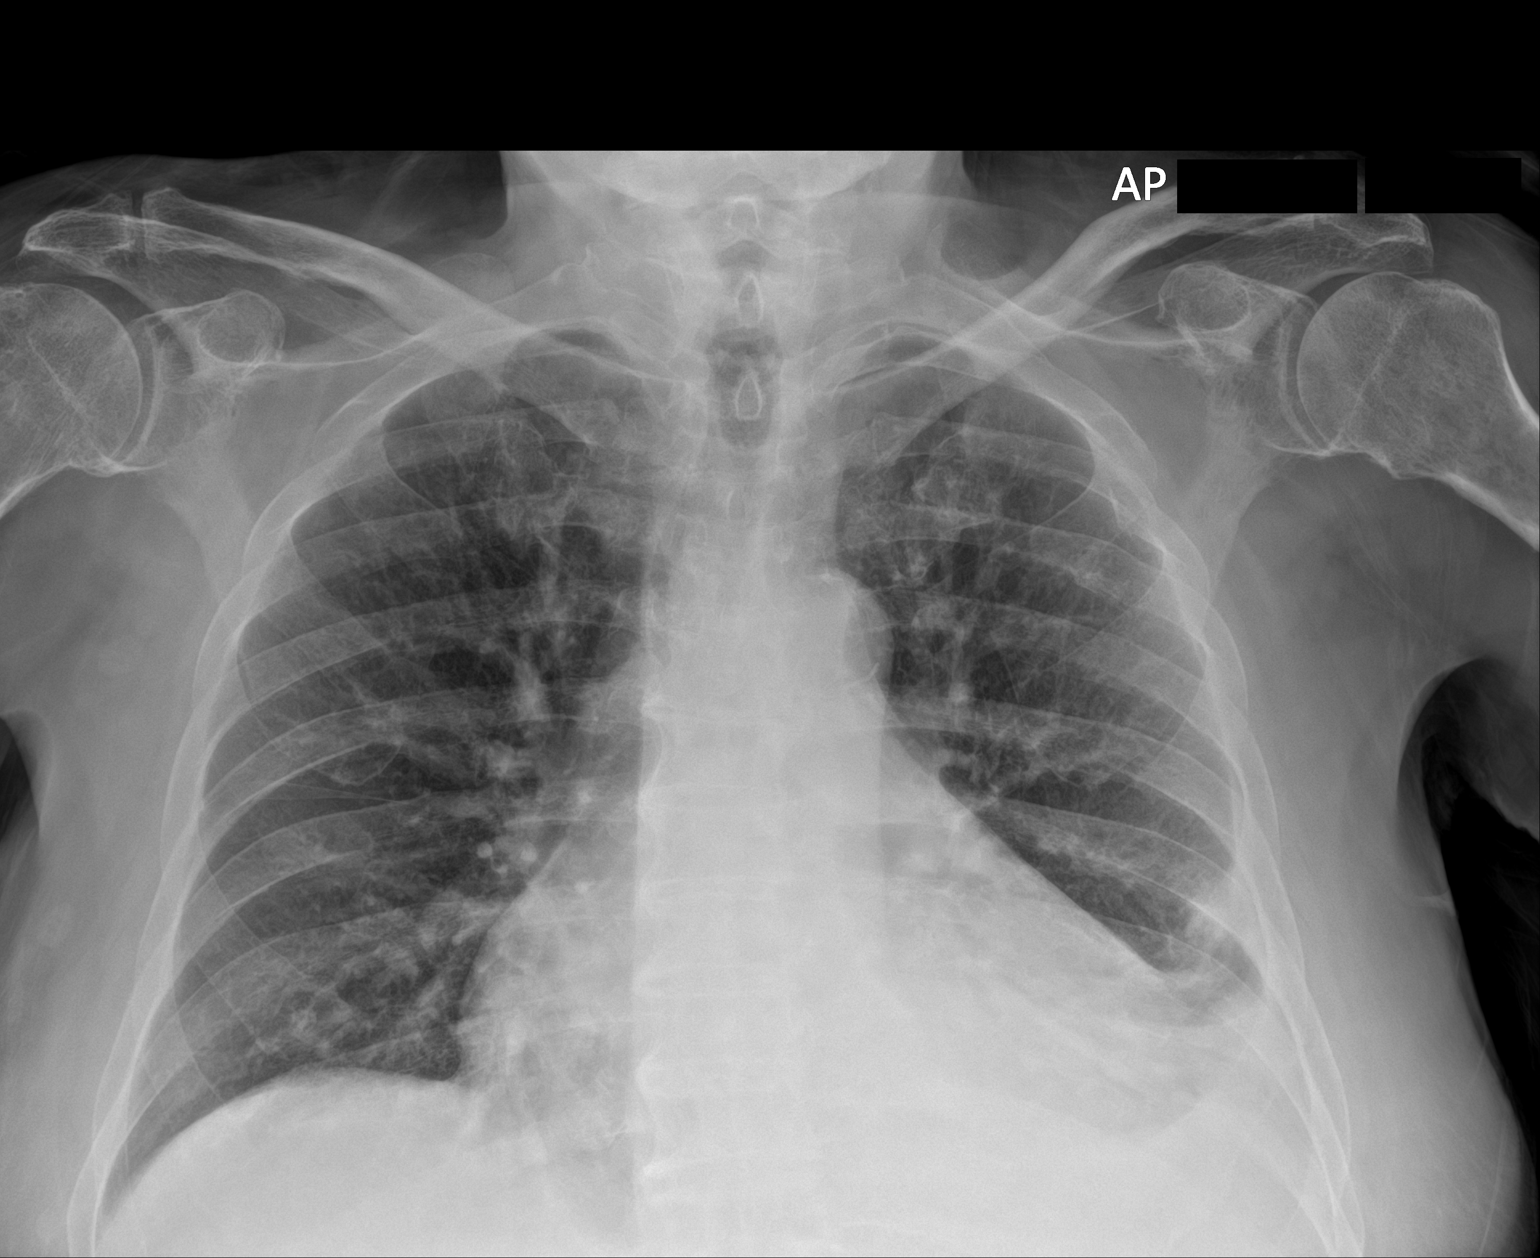

[Series 2: rib · 0.14mm/px · 2 of 2 slices shown]
[im 1/2]
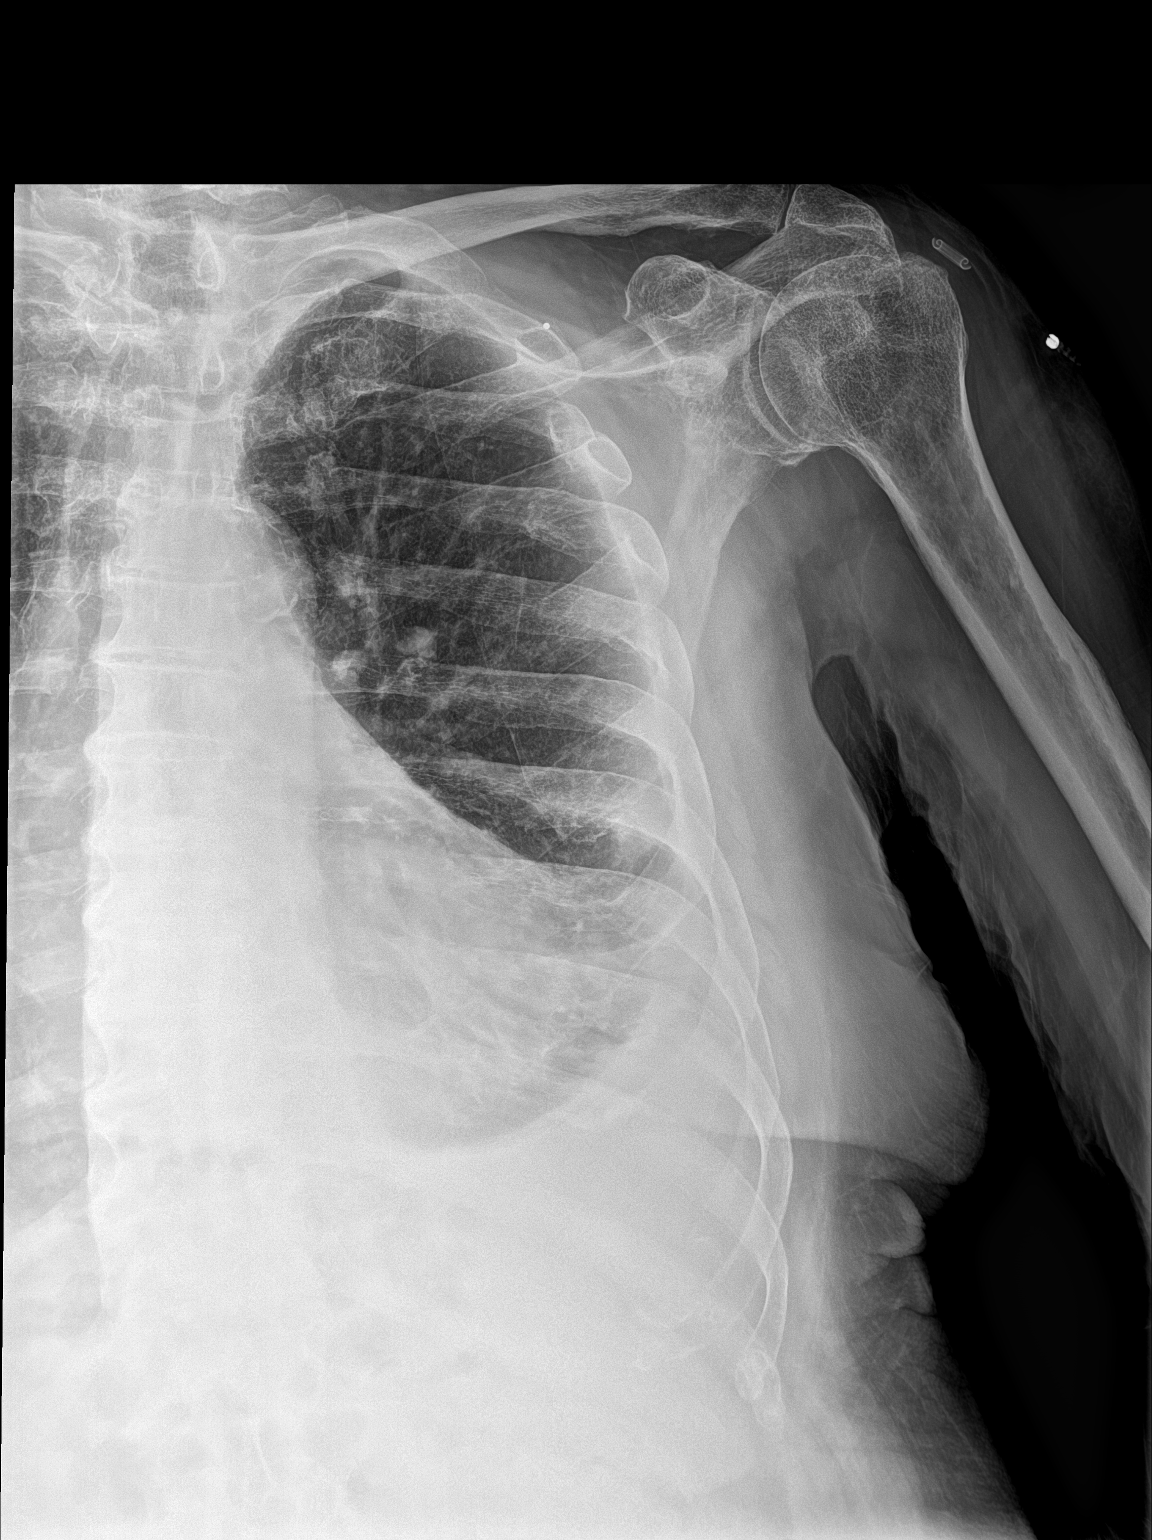
[im 2/2]
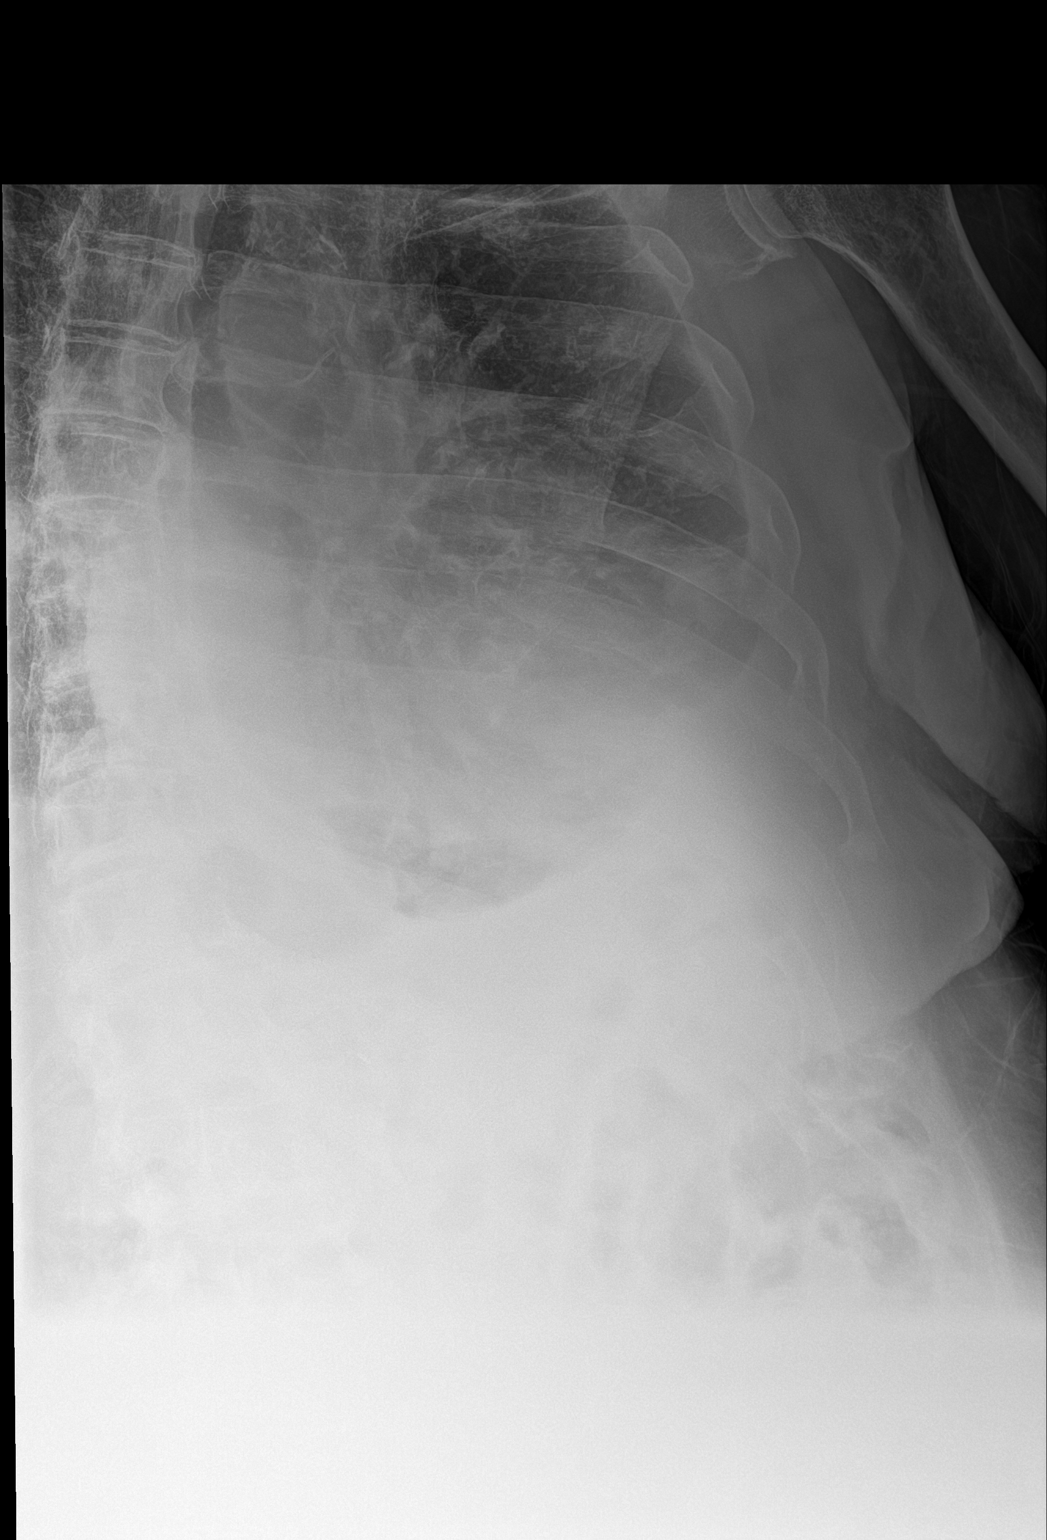

[3 of 3 positions shown; findings below may reference images not displayed]

FINDINGS: Chronic small left pleural effusion with adjacent scarring. The left
third rib fracture is chronic when compared with prior imaging.
Cardiomegaly. No edema or pneumothorax.
IMPRESSION: 1. Remote left third rib fracture.
2. Chronic small left pleural effusion.

## 2020-09-24 IMAGING — CT CT HEAD W/O CM
4 series · 16 of 47 positions shown, 18 images · non-contrast
Comparison: [DATE]

CLINICAL DATA: Fall.  Head pain.  Jaw pain.

EXAM:
CT HEAD WITHOUT CONTRAST
TECHNIQUE: Contiguous axial images were obtained from the base of the skull
through the vertex without intravenous contrast.

[Series 2: head wo · axial · 0.46mm/px · z∈[-281,-161]mm · 7 of 34 slices shown, 9 images]
[im 5/34  brain]
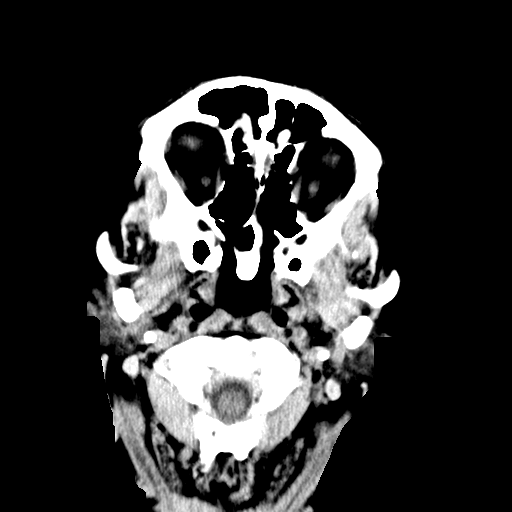
[im 5/34  bone]
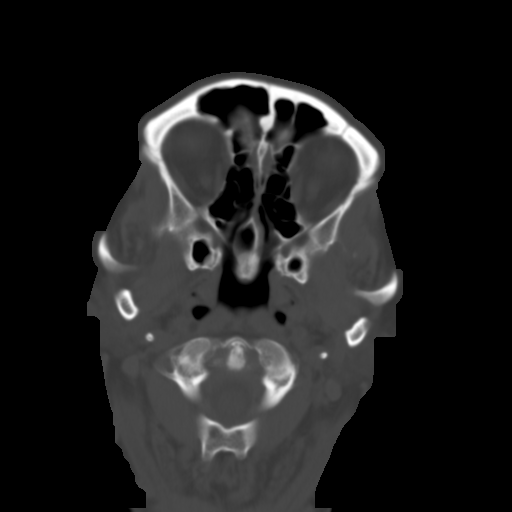
[im 9/34  brain]
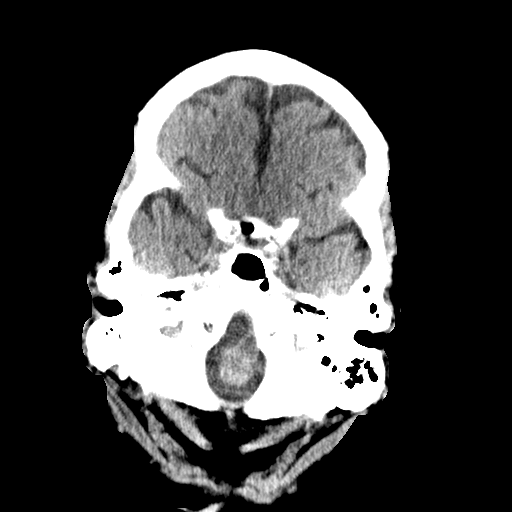
[im 13/34  brain]
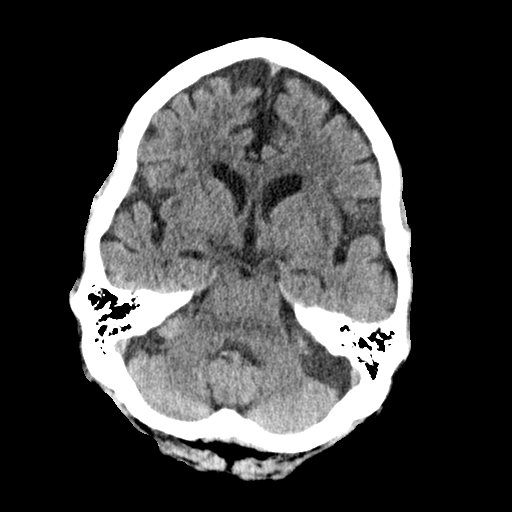
[im 17/34  brain]
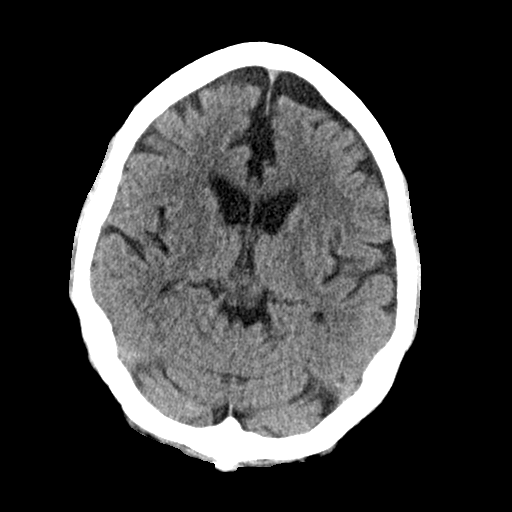
[im 21/34  brain]
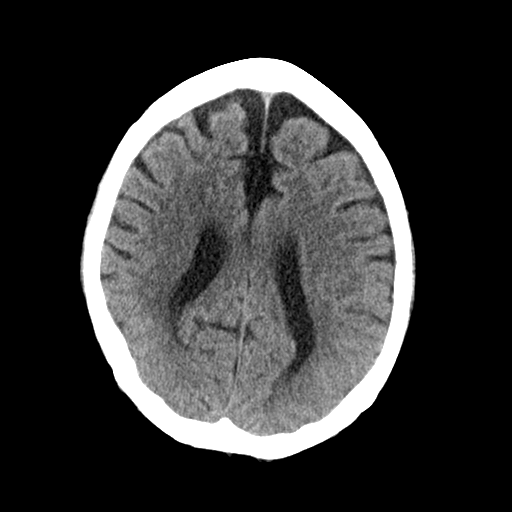
[im 21/34  bone]
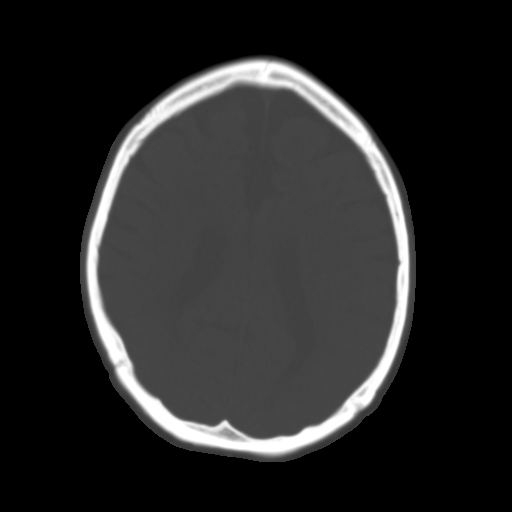
[im 25/34  brain]
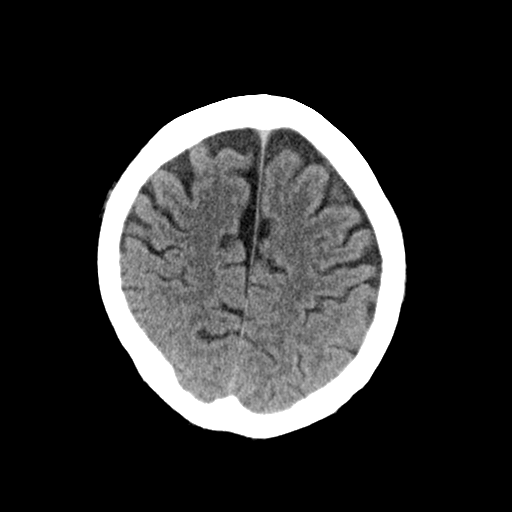
[im 29/34  brain]
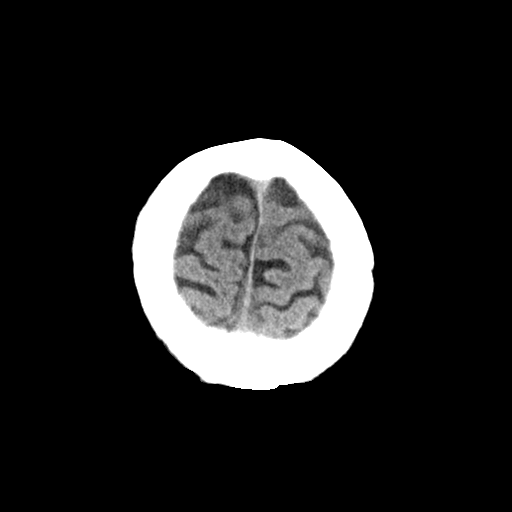

[Series 3: head bone · axial · 0.46mm/px · z∈[-285,-253]mm · 3 of 83 slices shown]
[im 9/83  bone]
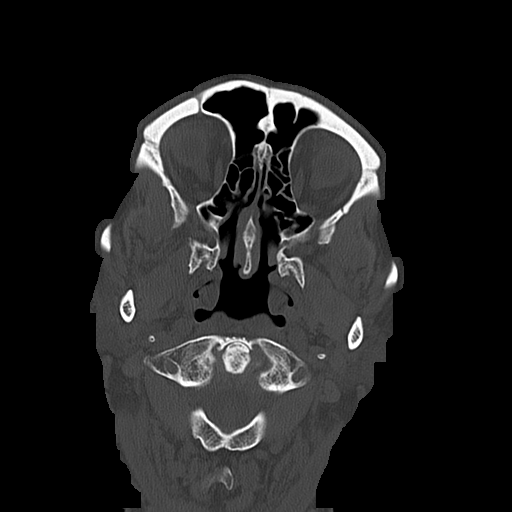
[im 17/83  bone]
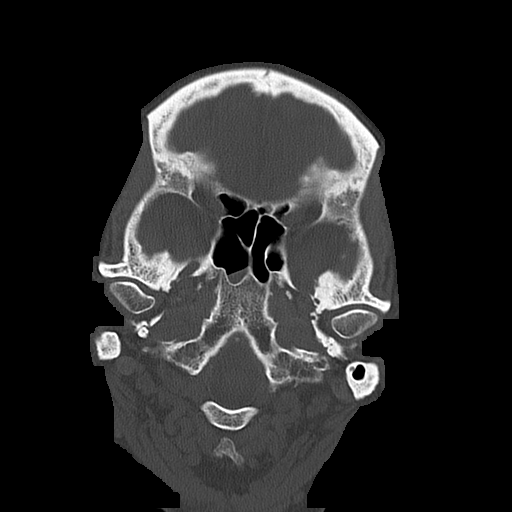
[im 25/83  bone]
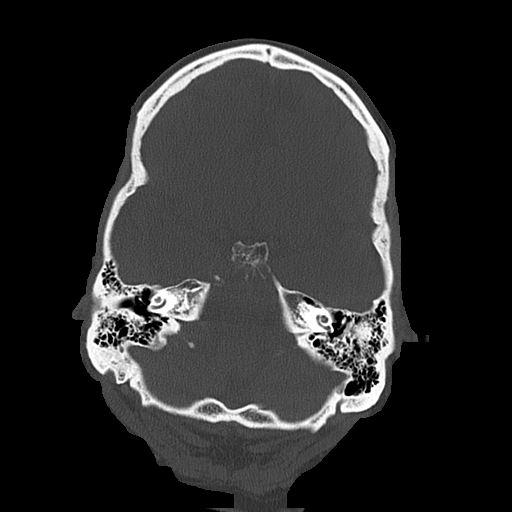

[Series 4: coronal soft · coronal · 0.33mm/px · 3 of 67 slices shown]
[im 24/67  brain]
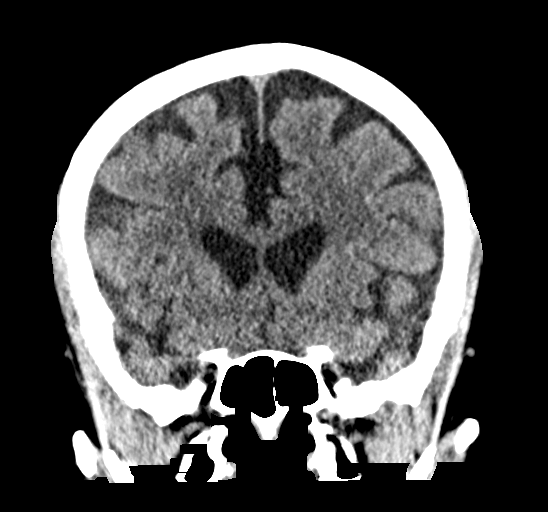
[im 30/67  brain]
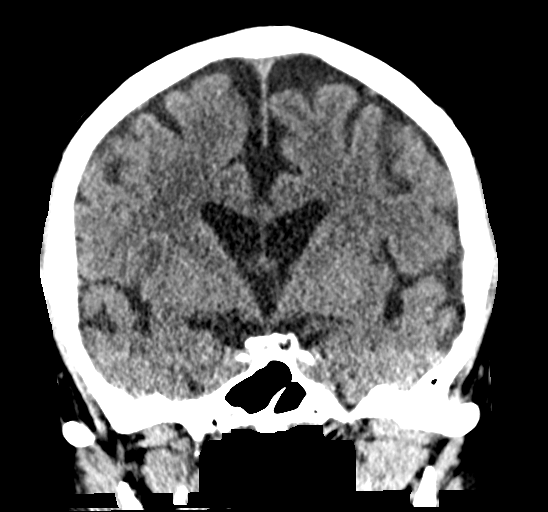
[im 37/67  brain]
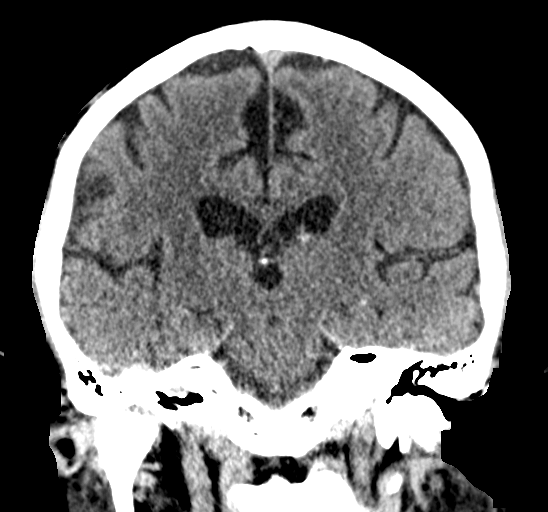

[Series 5: sagittal soft · sagittal · 0.33mm/px · 3 of 60 slices shown]
[im 20/60  brain]
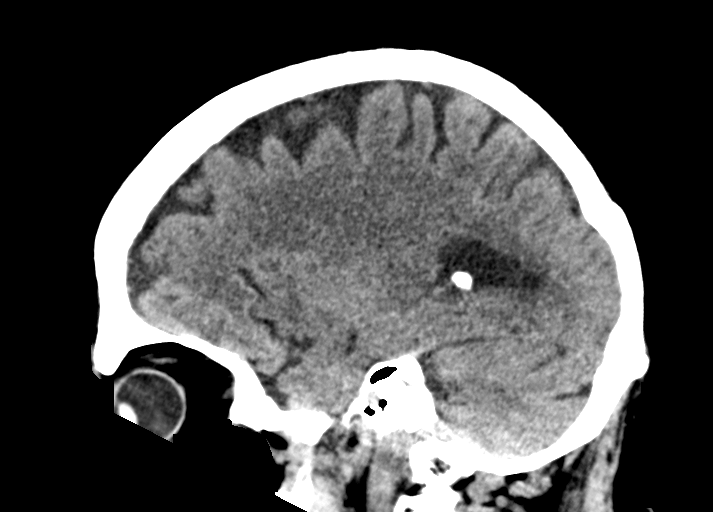
[im 30/60  brain]
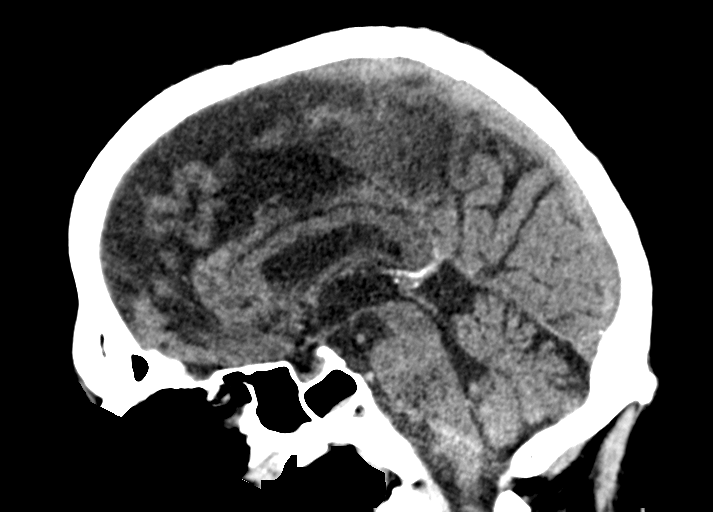
[im 40/60  brain]
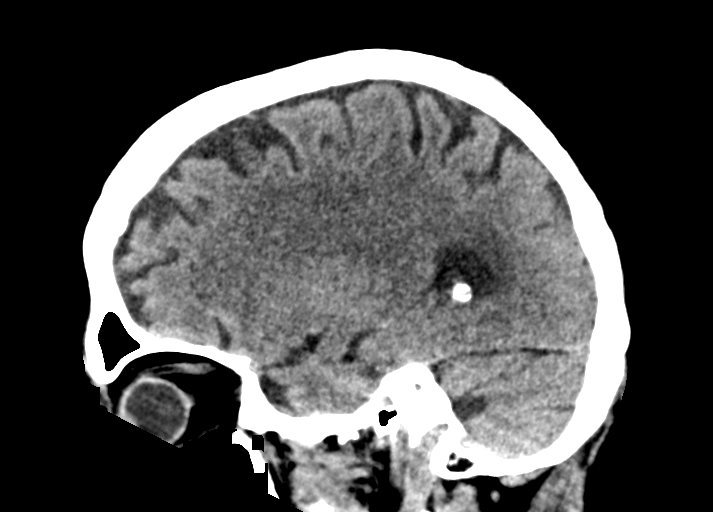

[16 of 47 positions shown; findings below may reference images not displayed]

FINDINGS: Brain: Normal cerebral and cerebellar volume for age. No mass
lesion, hemorrhage, hydrocephalus, acute infarct, intra-axial, or
extra-axial fluid collection.

Vascular: No hyperdense vessel or unexpected calcification.
Intracranial atherosclerosis.

Skull: No significant soft tissue swelling.  No skull fracture.

Sinuses/Orbits: Surgical changes about both globes. Minimal mucosal
thickening of sphenoid sinus. Clear mastoid air cells.

Other: None.
IMPRESSION: Normal head CT for age.

Minimal sinus disease.

## 2020-09-24 IMAGING — DX DG SHOULDER 2+V*L*
2 series · 2 of 2 positions shown · non-contrast
Comparison: None.

CLINICAL DATA: pain after fall

EXAM:
LEFT SHOULDER - 2+ VIEW

[shoulder grashey]
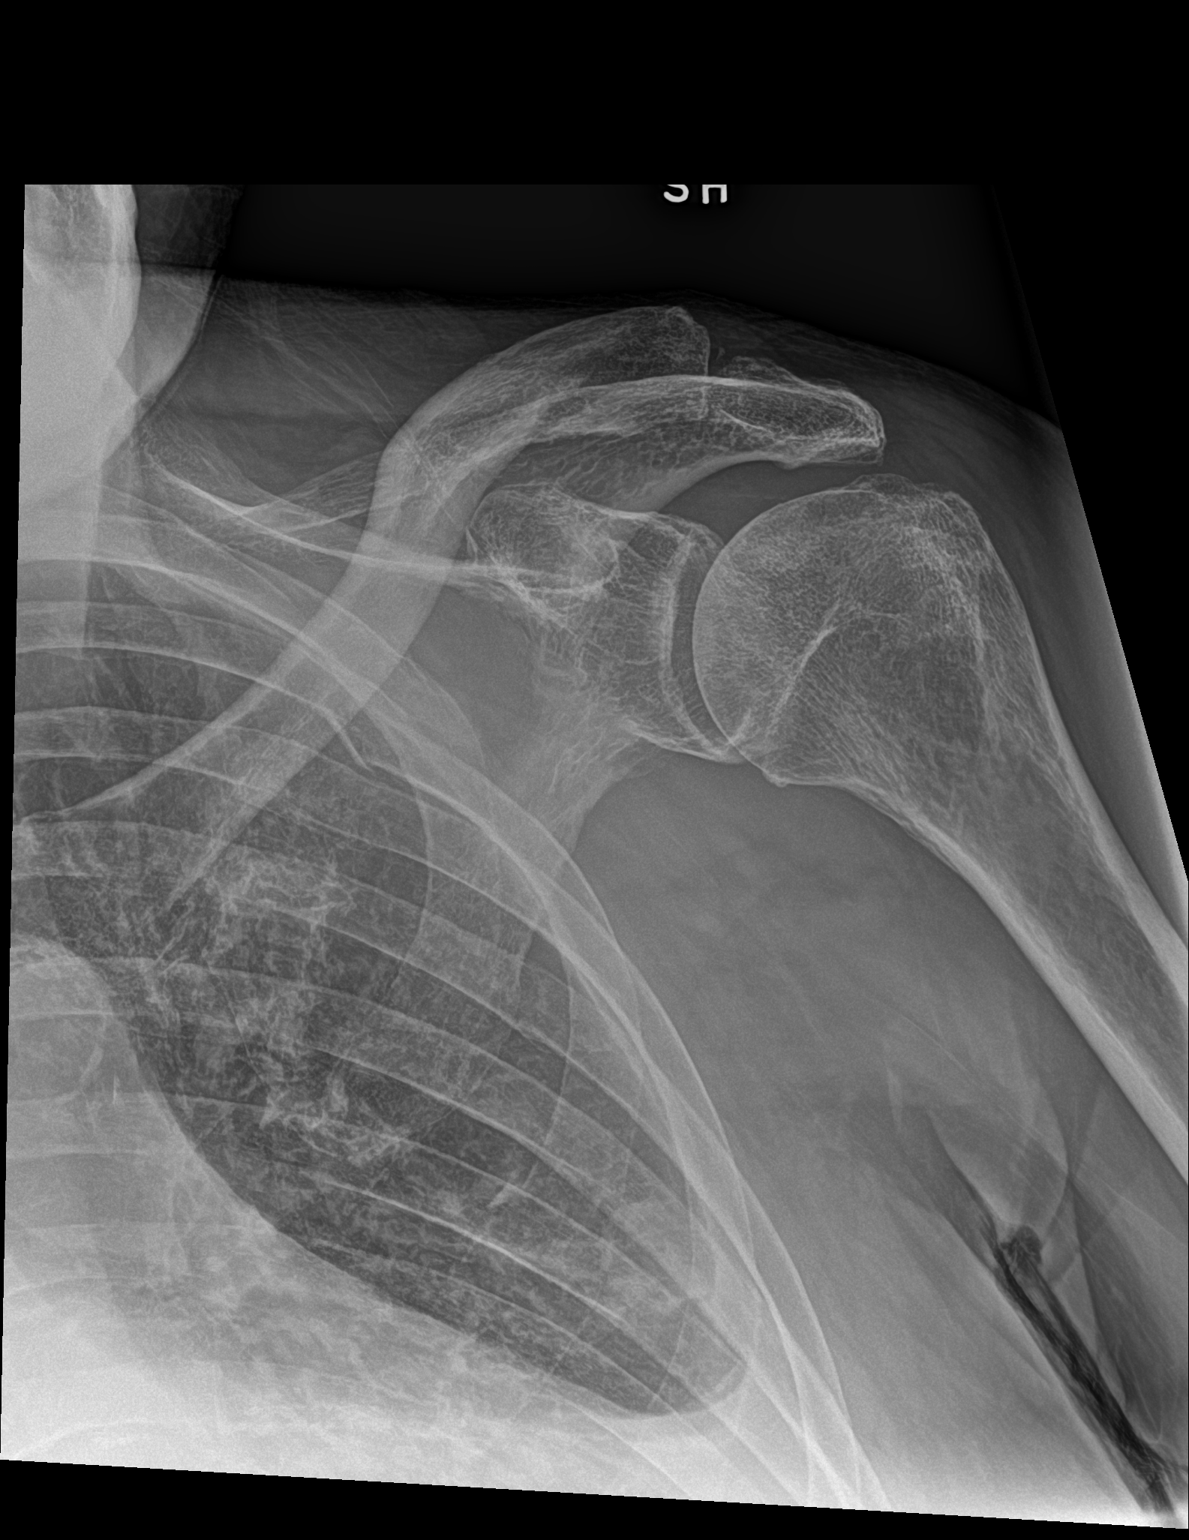

[shoulder y view]
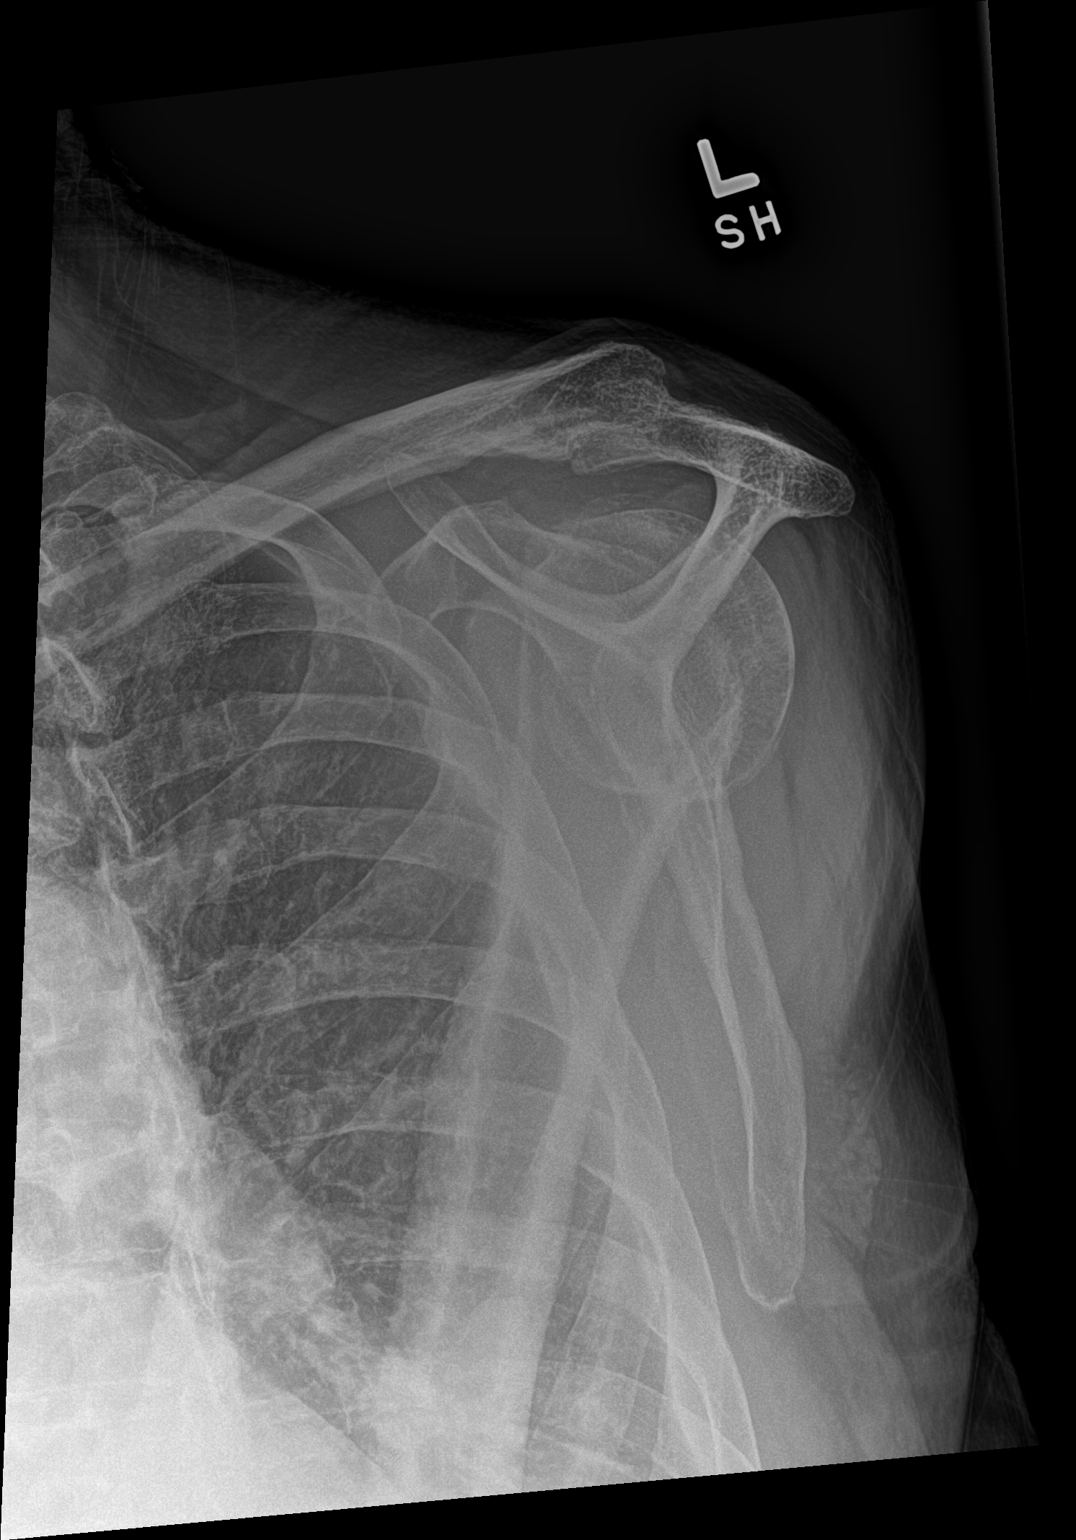

[2 of 2 positions shown; findings below may reference images not displayed]

FINDINGS: Minimally displaced fracture of the left third rib.

No additional fractures or dislocations.
IMPRESSION: 1. No acute fracture or dislocation of the left shoulder.
2. Minimally displaced fracture of the posterior left third rib.

## 2020-09-24 MED ORDER — ACETAMINOPHEN 500 MG PO TABS
1000.0000 mg | ORAL_TABLET | Freq: Once | ORAL | Status: AC
  Administered 2020-09-24: 1000 mg via ORAL
  Filled 2020-09-24: qty 2

## 2020-09-24 NOTE — ED Provider Notes (Signed)
[facility] EMERGENCY DEPT Provider Note   CSN: [id number] Arrival date & time: 09/24/20  [id number]     History Chief Complaint  Patient presents with   [name] is a 87 y.o. male.  Patient is an 87 year old male with a history of diabetes, hyperlipidemia, CAD, recurrent falls and prior subdural hemorrhage, dementia who is presenting today with complaint of a fall.  He had gotten out of bed and was going to the bathroom and his Rollator got away from him.  He reports that it started going too fast and it caused him to lose his balance and he fell sideways to the left hitting his cheek, head, left shoulder and hip.  He hit a Ecologist on the way down and landed on the floor.  Family member who is present with him in the room reports that he did seem dazed for a few minutes but he never lost consciousness.  He was able to then initiate the steps and getting up off the floor and was able to ambulate after the fall.  He currently is at his baseline and is complaining of some chin pain, left shoulder pain but otherwise feels his normal self.  He has no headache or neck pain.  He takes no anticoagulation.  The history is provided by the patient and a relative.  Fall This is a recurrent problem.      Past Medical History:  Diagnosis Date   Acute blood loss anemia    Acute upper respiratory infections of unspecified site 11/04/2012   AKI (acute kidney injury)    Benign prostatic hyperplasia    CAD (coronary artery disease)    s/p cypher DES to pLAD 6/08; normal LVF;  ETT-Myoview 2009: no ischemia    Clavicle fracture 08/10/2019   Closed displaced fracture of phalanx of left thumb, sequela 08/10/2019   Coronary atherosclerosis of native coronary artery 03/08/2008   Eosinophilia    Essential hypertension    Fracture of multiple ribs with pain 08/10/2019   History of multiple falls    History of SAH (subarachnoid hemorrhage) 07/14/2019   Hyperlipidemia type IIB /  III 03/08/2008   Major neurocognitive disorder due to possible Alzheimer's disease, without behavioral disturbance 02/29/2020   MI (myocardial infarction)    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   Imaging revealed SAH   Nocturnal leg cramps 11/23/2012   Orthostatic hypotension 03/08/2008   Pain in joint, shoulder region 11/23/2012   Stage 3b chronic kidney disease    Thrombocytopenia    Trigeminal neuralgia    Type 2 diabetes mellitus     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    Multiple trauma    Benign prostatic hyperplasia    Essential hypertension    Stage 3b chronic kidney disease ([place])    Thrombocytopenia    History of multiple falls 07/14/2019   History of SAH (subarachnoid hemorrhage) 07/14/2019   Type 2 diabetes mellitus    Eosinophilia    Nocturnal leg cramps 11/23/2012   Pain in joint, shoulder region 11/23/2012   Hematoma 11/04/2012   Hyperlipidemia type IIB / III 03/08/2008   Orthostatic hypotension 03/08/2008   Coronary atherosclerosis  of native coronary artery 03/08/2008    Past Surgical History:  Procedure Laterality Date   APPENDECTOMY     CARDIAC CATHETERIZATION  07/30/2006   CORONARY ANGIOPLASTY WITH STENT PLACEMENT   CARDIAC CATHETERIZATION     EXPLORATORY LAPAROTOMY     age 13   EXPLORATORY LAPAROTOMY     IR THORACENTESIS ASP PLEURAL SPACE W/IMG GUIDE  10/05/2019       Family History  Problem Relation Age of Onset   Stomach cancer Mother    Memory loss Mother    Heart disease Father    Coronary artery disease Father 62   Heart attack Father    Heart disease Brother    Aortic aneurysm Brother    Colon cancer  Brother    Aortic aneurysm Brother    Colon cancer Brother    Arthritis Daughter        rheumatoid   Coronary artery disease Brother    Esophageal cancer Neg Hx    Rectal cancer Neg Hx     Social History   Tobacco Use   Smoking status: Never   Smokeless tobacco: Never  Vaping Use   Vaping Use: Never used  Substance Use Topics   Alcohol use: Not Currently   Drug use: Never    Home Medications Prior to Admission medications   Medication Sig Start Date End Date Taking? Authorizing Provider  albuterol (PROAIR HFA) 108 (90 Base) MCG/ACT inhaler INHALE 2 PUFFS INTO THE LUNGS EVERY 6 HOURS AS NEEDED FOR WHEEZING OR SHORTNESS OF BREATH Patient taking differently: Inhale 2 puffs into the lungs every 6 (six) hours as needed for wheezing or shortness of breath. 08/06/19   Angiulli, Lavon Paganini, PA-C  amLODipine (NORVASC) 5 MG tablet Take 1 tablet (5 mg total) by mouth daily. 11/25/19 11/19/20  Sherren Mocha, MD  [facility] EXTRA STRENGTH PO) Take 1 tablet by mouth daily.    [provider]  atorvastatin (LIPITOR) 20 MG tablet Take 1 tablet (20 mg total) by mouth daily. 08/22/20   [name], MD  BREO ELLIPTA 200-25 MCG/INH AEPB INHALE 1 PUFF BY MOUTH EVERY DAY 09/19/20   [name], MD  Calcium Carb-Cholecalciferol (CALCIUM 600+D3 PO) Take 1 tablet by mouth 2 (two) times daily.    [provider]  doxazosin (CARDURA) 2 MG tablet TAKE 1 TABLET BY MOUTH EVERY DAY Patient taking differently: Take 2 mg by mouth daily. 11/30/19   [name], MD  escitalopram (LEXAPRO) 10 MG tablet Take 1 tablet (10 mg total) by mouth daily. 07/14/20   [name], MD  ezetimibe (ZETIA) 10 MG tablet TAKE 1 TABLET BY MOUTH EVERY DAY Patient taking differently: Take 10 mg by mouth at bedtime. 11/30/19   Sherren Mocha, MD  loratadine (CLARITIN) 10 MG tablet Take 10 mg by mouth daily as needed for allergies or rhinitis.     [provider]  Multiple Vitamin  (MULTIVITAMIN WITH MINERALS) TABS tablet Take 1 tablet by mouth daily.    [provider]  rivastigmine (EXELON) 1.5 MG capsule Take 1 capsule daily for 1 week, then increase to 1 capsule twice a day 07/08/20   Cameron Sprang, MD  terbinafine (LAMISIL) 250 MG tablet Take 1 tablet (250 mg total) by mouth daily. 08/22/20   [name], MD    Allergies    Gabapentin  Review of Systems   Review of Systems  All other systems reviewed and are negative.  Physical Exam Updated Vital  Signs BP (!) 173/81 (BP Location: Right Arm)   Pulse 66   Temp 97.7 F (36.5 C) (Oral)   Resp 20   Ht '5\' 6"'$  (1.676 m)   Wt 81.6 kg   SpO2 96%   BMI 29.05 kg/m   Physical Exam Vitals and nursing note reviewed.  Constitutional:      General: He is not in acute distress.    Appearance: He is well-developed.  HENT:     Head: Normocephalic and atraumatic.     Nose: Nose normal.     Mouth/Throat:     Mouth: Mucous membranes are moist.     Comments: Able to fully open and close the mouth without any significant jaw pain.  Mild tenderness to the left chin but no significant ecchymosis or swelling Eyes:     Conjunctiva/sclera: Conjunctivae normal.     Pupils: Pupils are equal, round, and reactive to light.  Cardiovascular:     Rate and Rhythm: Normal rate and regular rhythm.     Heart sounds: No murmur heard. Pulmonary:     Effort: Pulmonary effort is normal. No respiratory distress.     Breath sounds: Normal breath sounds. No wheezing or rales.  Abdominal:     General: There is no distension.     Palpations: Abdomen is soft.     Tenderness: There is no abdominal tenderness. There is no guarding or rebound.  Musculoskeletal:        General: No tenderness. Normal range of motion.     Left shoulder: Swelling and bony tenderness present. Normal range of motion.     Left elbow: Normal.     Left wrist: Normal.       Arms:     Cervical back: Normal range of motion and neck supple. No  tenderness. No spinous process tenderness or muscular tenderness.       Legs:     Comments: Abrasion and swelling with mild ecchymosis noted to the left deltoid.  Other than a small abrasion to the left hip full range of motion bilateral hips, knees and ankles without pain  Skin:    General: Skin is warm and dry.     Findings: No erythema or rash.  Neurological:     Mental Status: He is alert and oriented to person, place, and time. Mental status is at baseline.  Psychiatric:        Mood and Affect: Mood normal.        Behavior: Behavior normal.    ED Results / Procedures / Treatments   Labs (all labs ordered are listed, but only abnormal results are displayed) Labs Reviewed - No data to display  EKG None  Radiology DG Ribs Unilateral W/Chest Left  Result Date: 09/24/2020 CLINICAL DATA:  Fall with third rib fracture noted on shoulder x-ray EXAM: LEFT RIBS AND CHEST - 3+ VIEW COMPARISON:  02/14/2020 FINDINGS: Chronic small left pleural effusion with adjacent scarring. The left third rib fracture is chronic when compared with prior imaging. Cardiomegaly. No edema or pneumothorax. IMPRESSION: 1. Remote left third rib fracture. 2. Chronic small left pleural effusion. Electronically Signed   By: Monte Fantasia M.D.   On: 09/24/2020 08:55   CT HEAD WO CONTRAST (5MM)  Result Date: 09/24/2020 CLINICAL DATA:  Fall.  Head pain.  Jaw pain. EXAM: CT HEAD WITHOUT CONTRAST TECHNIQUE: Contiguous axial images were obtained from the base of the skull through the vertex without intravenous contrast. COMPARISON:  08/31/2020 FINDINGS: Brain: Normal cerebral and  cerebellar volume for age. No mass lesion, hemorrhage, hydrocephalus, acute infarct, intra-axial, or extra-axial fluid collection. Vascular: No hyperdense vessel or unexpected calcification. Intracranial atherosclerosis. Skull: No significant soft tissue swelling.  No skull fracture. Sinuses/Orbits: Surgical changes about both globes. Minimal  mucosal thickening of sphenoid sinus. Clear mastoid air cells. Other: None. IMPRESSION: Normal head CT for age. Minimal sinus disease. Electronically Signed   By: Abigail Miyamoto M.D.   On: 09/24/2020 08:03   DG Shoulder Left  Result Date: 09/24/2020 CLINICAL DATA:  pain after fall EXAM: LEFT SHOULDER - 2+ VIEW COMPARISON:  None. FINDINGS: Minimally displaced fracture of the left third rib. No additional fractures or dislocations. IMPRESSION: 1. No acute fracture or dislocation of the left shoulder. 2. Minimally displaced fracture of the posterior left third rib. Electronically Signed   By: Miachel Roux M.D.   On: 09/24/2020 08:04    Procedures Procedures   Medications Ordered in ED Medications - No data to display  ED Course  I have reviewed the triage vital signs and the nursing notes.  Pertinent labs & imaging results that were available during my care of the patient were reviewed by me and considered in my medical decision making (see chart for details).    MDM Rules/Calculators/A&P                           Elderly male presenting today after a fall at home.  Patient unfortunately has recurrent falls and uses a Rollator walker.  Patient remembers the entire event today and had no prodromal symptoms concerning for syncope, dysrhythmia and feel the fall was balance related.  He did hit his head, face, left arm and hip but was able to stand and ambulate and low suspicion for lower extremity fracture.  He has no neck pain takes no anticoagulation is awake and alert at this time.  Head CT and left shoulder imaging pending.  8:12 AM Head CT was negative for acute pathology.  Shoulder image was negative but there was a minimally displaced fracture of the posterior third rib.  On reevaluation patient does have some mild tenderness back there.  Breath sounds are clear, satting 96% on room air but will do complete rib imaging and chest x-ray to ensure no other injury.  9:02 AM Plain films of  chest showed remote fx and no new findings.  Pt d/ced home in good condition.  MDM   Amount and/or Complexity of Data Reviewed Tests in the radiology section of CPT: ordered and reviewed Independent visualization of images, tracings, or specimens: yes     Final Clinical Impression(s) / ED Diagnoses Final diagnoses:  Fall, initial encounter  Contusion of left shoulder, initial encounter  Contusion of scalp, initial encounter    Rx / DC Orders ED Discharge Orders     None        Blanchie Dessert, MD 09/24/20 [phone]

## 2020-09-24 NOTE — ED Notes (Signed)
ED Provider at bedside. 

## 2020-09-24 NOTE — Discharge Instructions (Addendum)
The X-rays look ok today.  The chest x-ray showed the rib fracture was actually old from your prior fall.  You can take extra strength tylenol as needed for pain and you could use voltaren gel or lidocaine patches if needed.  Return to the ER if you develop severe headache, vomiting or change in mental status

## 2020-09-24 NOTE — ED Triage Notes (Signed)
Pt is present for a fall that occurred this morning after he was using his walker and it "sped up". Pt hit his head on his dresser and c/o left head pain, left jaw pain, and left shoulder pain. Abrasion to the left shoulder. Pt states left jaw is what hurts the most. Pt did not have LOC and is not on blood thinners. AAO x 4. Hx of Alzheimer.

## 2020-09-26 ENCOUNTER — Other Ambulatory Visit: Payer: PPO

## 2020-09-26 ENCOUNTER — Other Ambulatory Visit: Payer: Self-pay

## 2020-09-26 DIAGNOSIS — B351 Tinea unguium: Secondary | ICD-10-CM | POA: Diagnosis not present

## 2020-09-26 LAB — COMPLETE METABOLIC PANEL WITH GFR
AG Ratio: 1.6 (calc) (ref 1.0–2.5)
ALT: 15 U/L (ref 9–46)
AST: 27 U/L (ref 10–35)
Albumin: 3.9 g/dL (ref 3.6–5.1)
Alkaline phosphatase (APISO): 94 U/L (ref 35–144)
BUN/Creatinine Ratio: 17 (calc) (ref 6–22)
BUN: 34 mg/dL — ABNORMAL HIGH (ref 7–25)
CO2: 30 mmol/L (ref 20–32)
Calcium: 9 mg/dL (ref 8.6–10.3)
Chloride: 104 mmol/L (ref 98–110)
Creat: 1.98 mg/dL — ABNORMAL HIGH (ref 0.70–1.22)
Globulin: 2.5 g/dL (calc) (ref 1.9–3.7)
Glucose, Bld: 109 mg/dL — ABNORMAL HIGH (ref 65–99)
Potassium: 4.2 mmol/L (ref 3.5–5.3)
Sodium: 142 mmol/L (ref 135–146)
Total Bilirubin: 0.4 mg/dL (ref 0.2–1.2)
Total Protein: 6.4 g/dL (ref 6.1–8.1)
eGFR: 32 mL/min/{1.73_m2} — ABNORMAL LOW (ref >=60)

## 2020-09-28 ENCOUNTER — Encounter (HOSPITAL_COMMUNITY): Payer: Self-pay | Admitting: Emergency Medicine

## 2020-09-28 ENCOUNTER — Emergency Department (HOSPITAL_COMMUNITY)
Admission: EM | Admit: 2020-09-28 | Discharge: 2020-09-28 | Disposition: A | Discharge status: Home / Self Care | Payer: PPO | Attending: Emergency Medicine | Admitting: Emergency Medicine

## 2020-09-28 ENCOUNTER — Telehealth: Payer: Self-pay

## 2020-09-28 ENCOUNTER — Emergency Department (HOSPITAL_COMMUNITY): Payer: PPO

## 2020-09-28 DIAGNOSIS — I129 Hypertensive chronic kidney disease with stage 1 through stage 4 chronic kidney disease, or unspecified chronic kidney disease: Secondary | ICD-10-CM | POA: Insufficient documentation

## 2020-09-28 DIAGNOSIS — F0390 Unspecified dementia without behavioral disturbance: Secondary | ICD-10-CM | POA: Insufficient documentation

## 2020-09-28 DIAGNOSIS — R519 Headache, unspecified: Secondary | ICD-10-CM | POA: Insufficient documentation

## 2020-09-28 DIAGNOSIS — S0990XA Unspecified injury of head, initial encounter: Secondary | ICD-10-CM | POA: Diagnosis not present

## 2020-09-28 DIAGNOSIS — Z79899 Other long term (current) drug therapy: Secondary | ICD-10-CM | POA: Insufficient documentation

## 2020-09-28 DIAGNOSIS — R112 Nausea with vomiting, unspecified: Secondary | ICD-10-CM | POA: Diagnosis not present

## 2020-09-28 DIAGNOSIS — E119 Type 2 diabetes mellitus without complications: Secondary | ICD-10-CM | POA: Diagnosis not present

## 2020-09-28 DIAGNOSIS — R110 Nausea: Secondary | ICD-10-CM | POA: Diagnosis not present

## 2020-09-28 DIAGNOSIS — M47812 Spondylosis without myelopathy or radiculopathy, cervical region: Secondary | ICD-10-CM | POA: Diagnosis not present

## 2020-09-28 DIAGNOSIS — R296 Repeated falls: Secondary | ICD-10-CM | POA: Diagnosis not present

## 2020-09-28 DIAGNOSIS — R42 Dizziness and giddiness: Secondary | ICD-10-CM

## 2020-09-28 DIAGNOSIS — I2510 Atherosclerotic heart disease of native coronary artery without angina pectoris: Secondary | ICD-10-CM | POA: Diagnosis not present

## 2020-09-28 DIAGNOSIS — N1832 Chronic kidney disease, stage 3b: Secondary | ICD-10-CM | POA: Insufficient documentation

## 2020-09-28 DIAGNOSIS — S199XXA Unspecified injury of neck, initial encounter: Secondary | ICD-10-CM | POA: Diagnosis not present

## 2020-09-28 DIAGNOSIS — Z955 Presence of coronary angioplasty implant and graft: Secondary | ICD-10-CM | POA: Diagnosis not present

## 2020-09-28 DIAGNOSIS — I10 Essential (primary) hypertension: Secondary | ICD-10-CM | POA: Diagnosis not present

## 2020-09-28 DIAGNOSIS — G4489 Other headache syndrome: Secondary | ICD-10-CM | POA: Diagnosis not present

## 2020-09-28 DIAGNOSIS — I6529 Occlusion and stenosis of unspecified carotid artery: Secondary | ICD-10-CM | POA: Diagnosis not present

## 2020-09-28 DIAGNOSIS — I491 Atrial premature depolarization: Secondary | ICD-10-CM | POA: Diagnosis not present

## 2020-09-28 LAB — CBC WITH DIFFERENTIAL/PLATELET
Abs Immature Granulocytes: 0.04 10*3/uL (ref 0.00–0.07)
Basophils Absolute: 0 10*3/uL (ref 0.0–0.1)
Basophils Relative: 0 %
Eosinophils Absolute: 0 10*3/uL (ref 0.0–0.5)
Eosinophils Relative: 0 %
HCT: 39.1 % (ref 39.0–52.0)
Hemoglobin: 12.7 g/dL — ABNORMAL LOW (ref 13.0–17.0)
Immature Granulocytes: 0 %
Lymphocytes Relative: 3 %
Lymphs Abs: 0.3 10*3/uL — ABNORMAL LOW (ref 0.7–4.0)
MCH: 31.5 pg (ref 26.0–34.0)
MCHC: 32.5 g/dL (ref 30.0–36.0)
MCV: 97 fL (ref 80.0–100.0)
Monocytes Absolute: 0.5 10*3/uL (ref 0.1–1.0)
Monocytes Relative: 5 %
Neutro Abs: 8.5 10*3/uL — ABNORMAL HIGH (ref 1.7–7.7)
Neutrophils Relative %: 92 %
Platelets: 186 10*3/uL (ref 150–400)
RBC: 4.03 MIL/uL — ABNORMAL LOW (ref 4.22–5.81)
RDW: 12.9 % (ref 11.5–15.5)
WBC: 9.4 10*3/uL (ref 4.0–10.5)
nRBC: 0 % (ref 0.0–0.2)

## 2020-09-28 LAB — COMPREHENSIVE METABOLIC PANEL
ALT: 26 U/L (ref 0–44)
AST: 44 U/L — ABNORMAL HIGH (ref 15–41)
Albumin: 3.9 g/dL (ref 3.5–5.0)
Alkaline Phosphatase: 97 U/L (ref 38–126)
Anion gap: 9 (ref 5–15)
BUN: 33 mg/dL — ABNORMAL HIGH (ref 8–23)
CO2: 25 mmol/L (ref 22–32)
Calcium: 9 mg/dL (ref 8.9–10.3)
Chloride: 103 mmol/L (ref 98–111)
Creatinine, Ser: 1.8 mg/dL — ABNORMAL HIGH (ref 0.61–1.24)
GFR, Estimated: 36 mL/min — ABNORMAL LOW (ref >60)
Glucose, Bld: 155 mg/dL — ABNORMAL HIGH (ref 70–99)
Potassium: 3.6 mmol/L (ref 3.5–5.1)
Sodium: 137 mmol/L (ref 135–145)
Total Bilirubin: 1.1 mg/dL (ref 0.3–1.2)
Total Protein: 7.2 g/dL (ref 6.5–8.1)

## 2020-09-28 IMAGING — CT CT CERVICAL SPINE W/O CM
2 series · 14 of 27 positions shown, 18 images · non-contrast
Comparison: Cervical MRI [DATE], most recent CT [DATE]

CLINICAL DATA: Neck trauma (Age >= 65y)

Frequent falls.
EXAM:
CT CERVICAL SPINE WITHOUT CONTRAST
TECHNIQUE: Multidetector CT imaging of the cervical spine was performed without
intravenous contrast. Multiplanar CT image reconstructions were also
generated.

[Series 6: c_spine 2.0 st · axial · 0.55mm/px · z∈[+1172,+1306]mm · 9 of 81 slices shown, 12 images]
[im 7/81  soft-tissue]
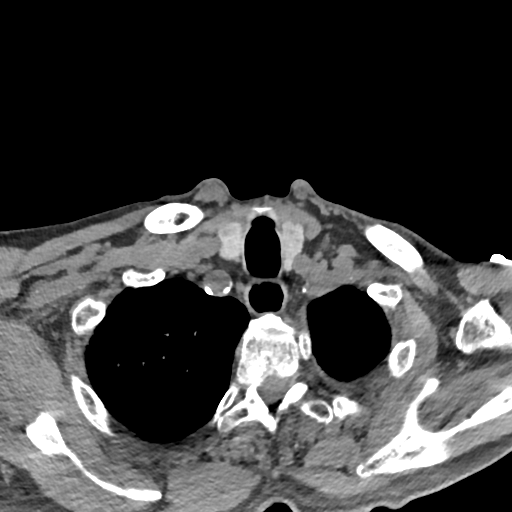
[im 7/81  bone]
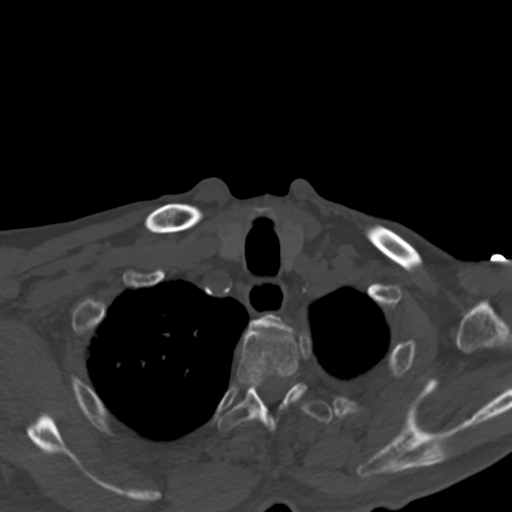
[im 19/81  bone]
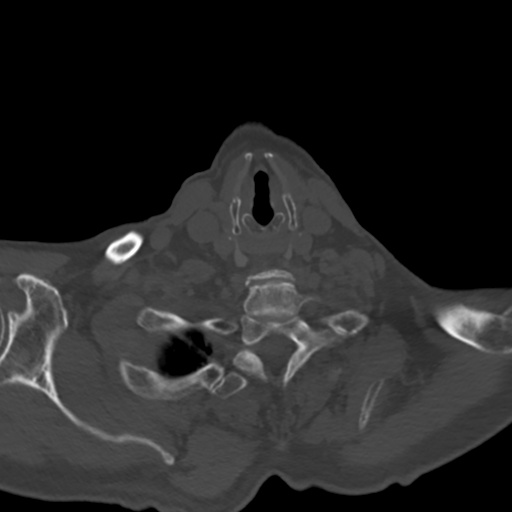
[im 25/81  bone]
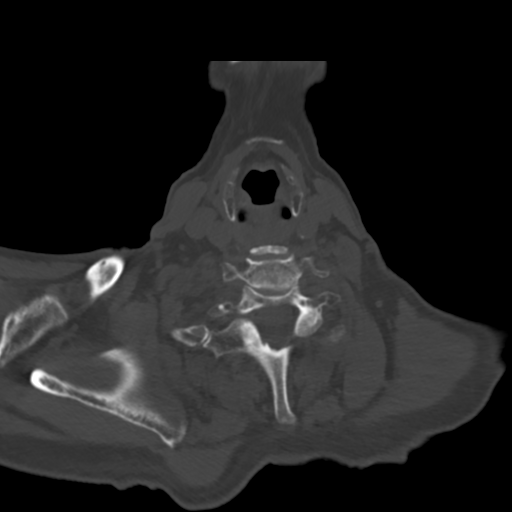
[im 31/81  bone]
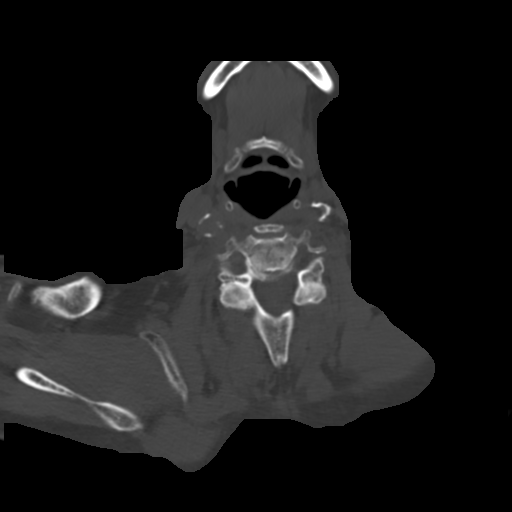
[im 44/81  soft-tissue]
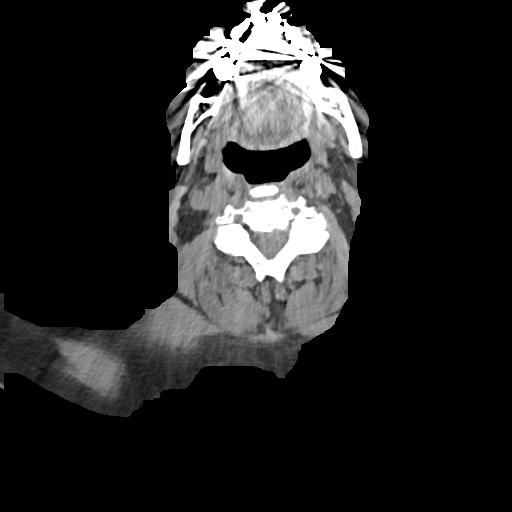
[im 44/81  bone]
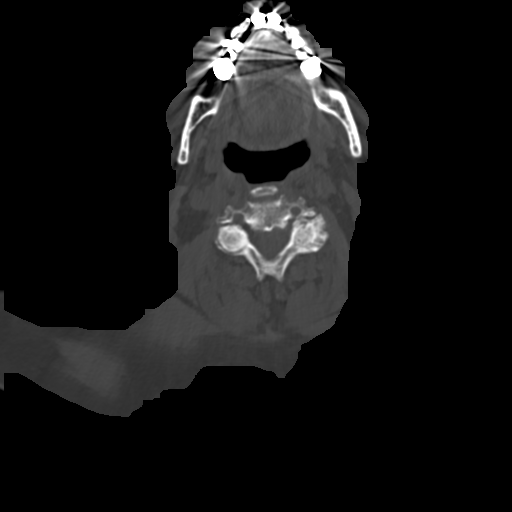
[im 50/81  bone]
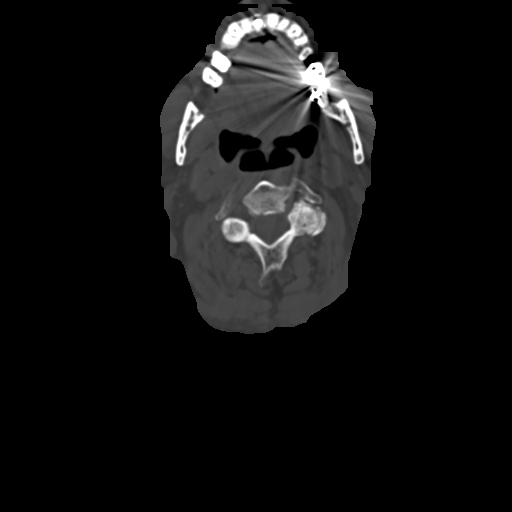
[im 56/81  bone]
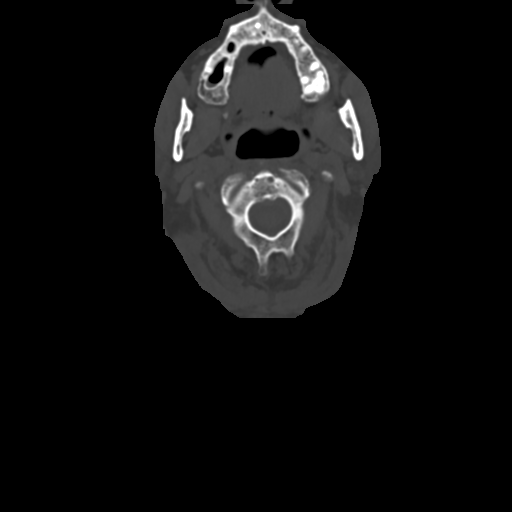
[im 68/81  bone]
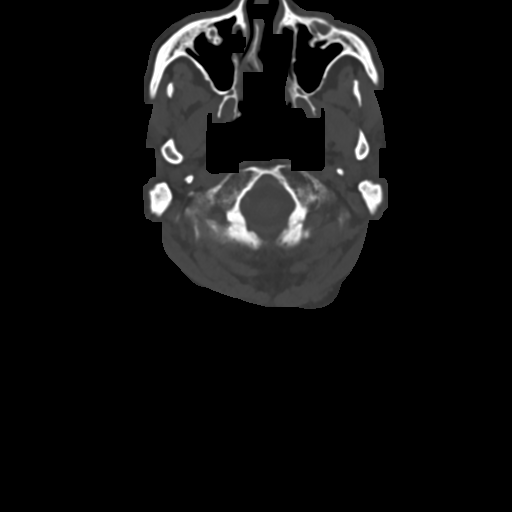
[im 74/81  soft-tissue]
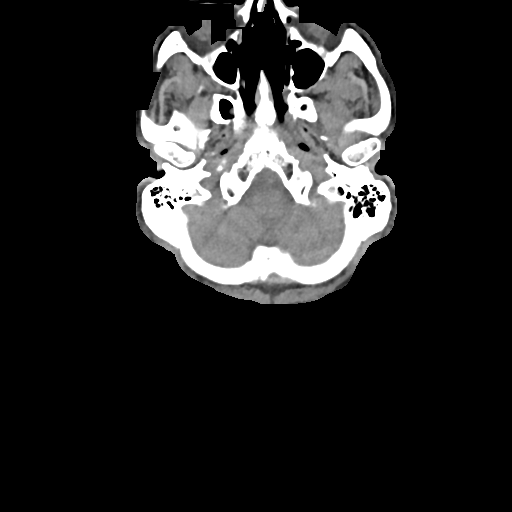
[im 74/81  bone]
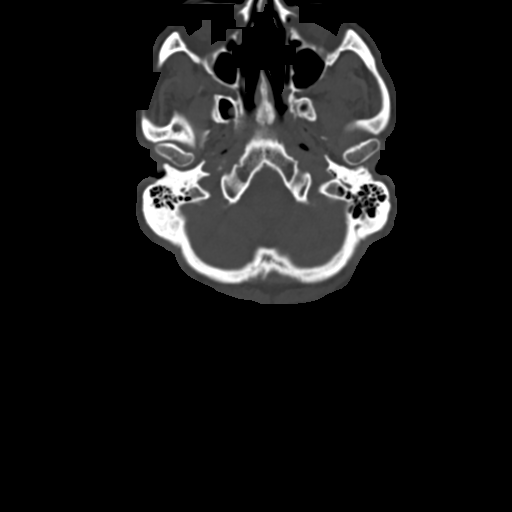

[Series 11: sagittal bone · sagittal · 0.36mm/px · 5 of 69 slices shown, 6 images]
[im 23/69  bone]
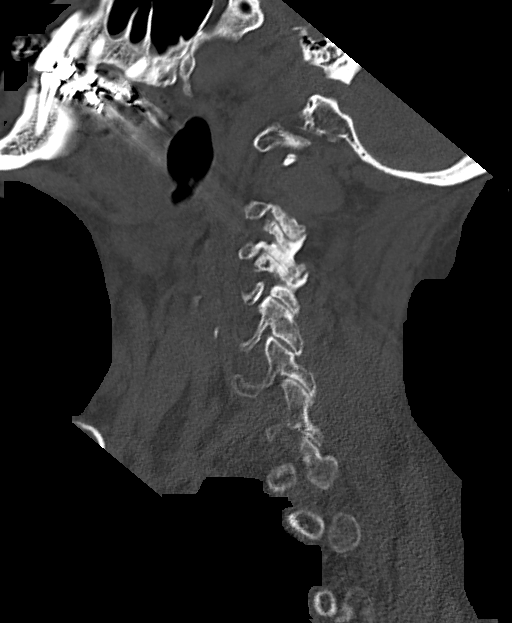
[im 29/69  bone]
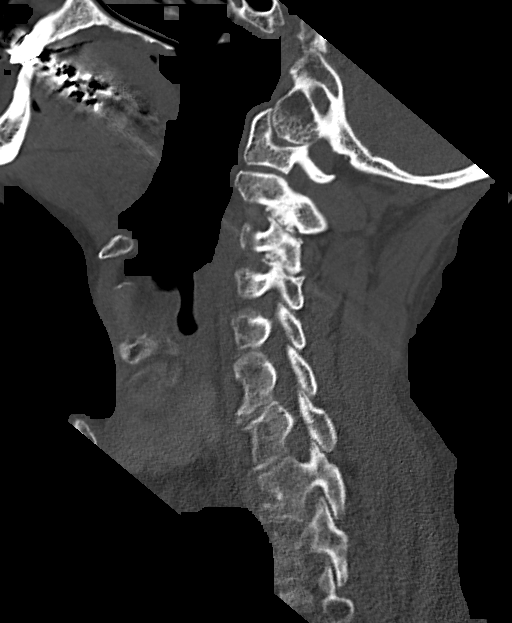
[im 35/69  soft-tissue]
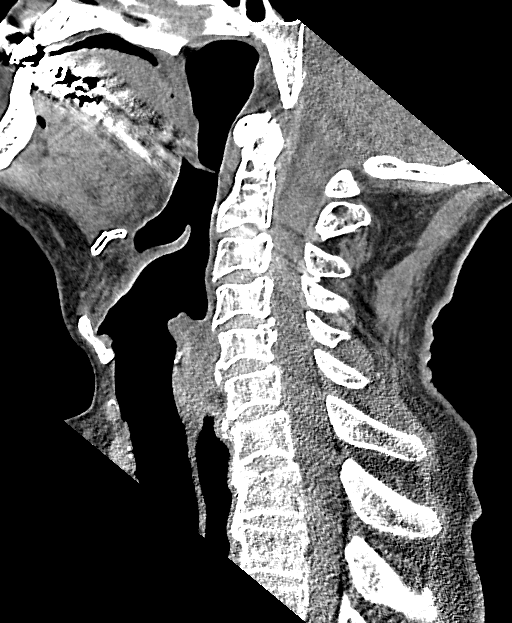
[im 35/69  bone]
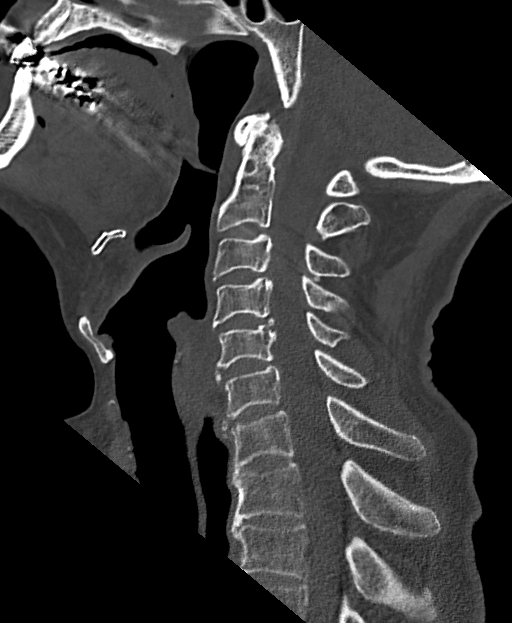
[im 40/69  bone]
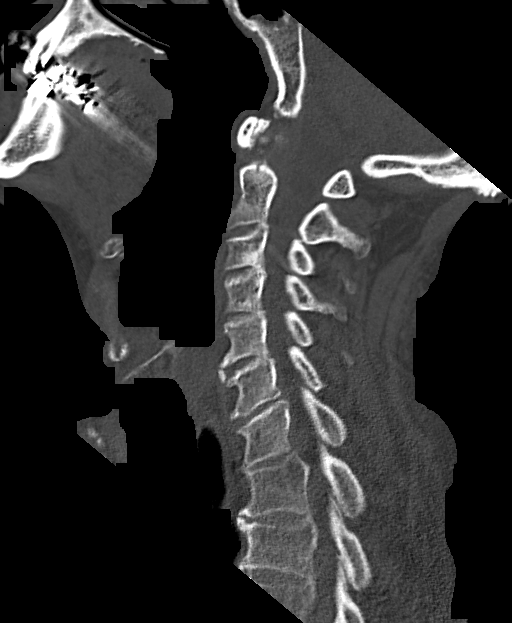
[im 46/69  bone]
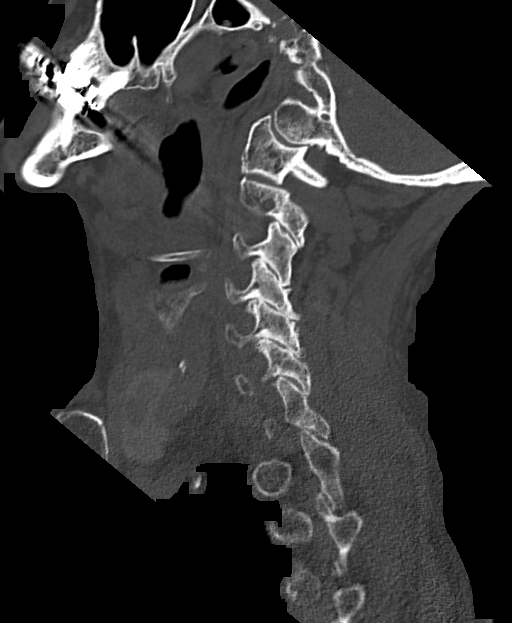

[14 of 27 positions shown; findings below may reference images not displayed]

FINDINGS: Alignment: Normal.

Skull base and vertebrae: No acute fracture. Mild chronic wedging of
C7 and T1, stable. Minimal sclerosis involving right inferior C3
endplate is also unchanged, likely degenerative. The dens and skull
base are intact.

Soft tissues and spinal canal: No prevertebral fluid or swelling. No
visible canal hematoma.

Disc levels: Multilevel degenerative disc disease and facet
hypertrophy. Degenerative changes most prominent at C4-C5. No
significant change from prior exam.

Upper chest: No acute or unexpected findings. Small bilateral
thyroid nodules are all less than 1.5 cm. Not clinically
significant; no follow-up imaging recommended (ref: [HOSPITAL]. [DATE]): 143-50).

Other: Carotid calcifications.
IMPRESSION: 1. No acute fracture or subluxation of the cervical spine.
2. Multilevel degenerative disc disease and facet hypertrophy.

## 2020-09-28 IMAGING — CT CT HEAD W/O CM
3 series · 14 of 47 positions shown, 16 images · non-contrast
Comparison: Brain MRI [DATE], CT head [DATE]

CLINICAL DATA: Trauma

EXAM:
CT HEAD WITHOUT CONTRAST
TECHNIQUE: Contiguous axial images were obtained from the base of the skull
through the vertex without intravenous contrast.

[Series 4: head 5.0 h30s · axial · 0.47mm/px · z∈[+1296,+1436]mm · 8 of 34 slices shown, 10 images]
[im 3/34  brain]
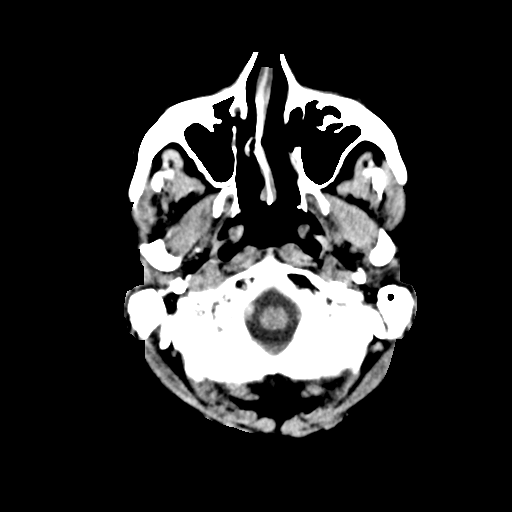
[im 3/34  bone]
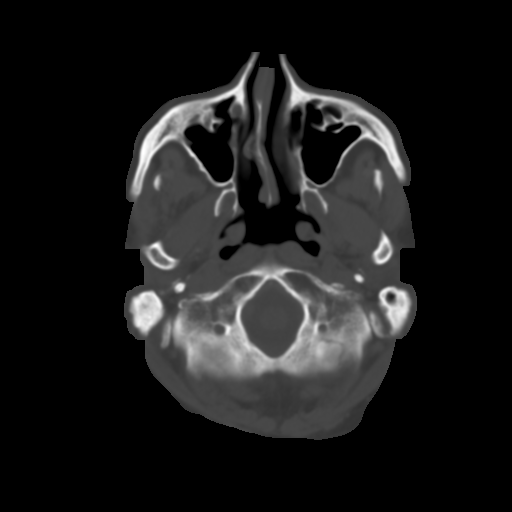
[im 7/34  brain]
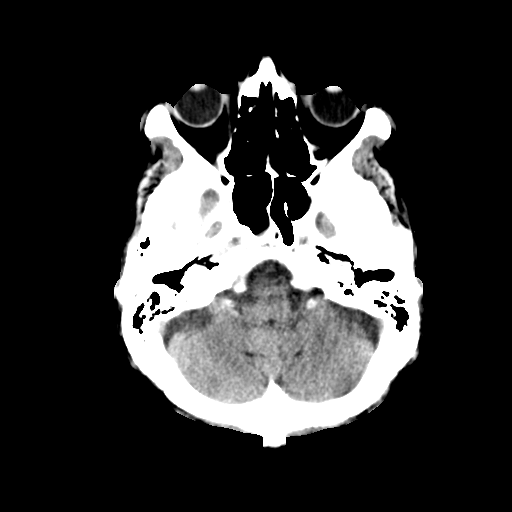
[im 11/34  brain]
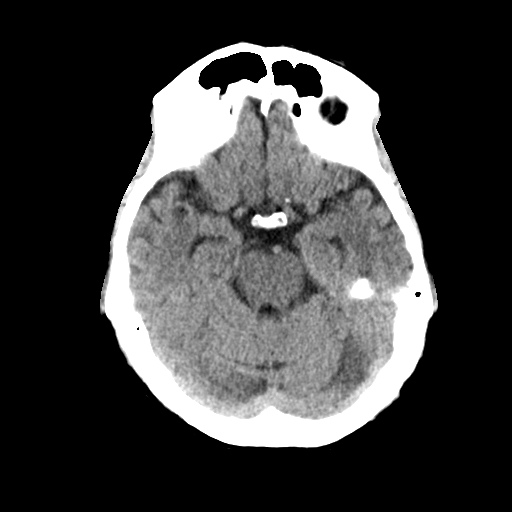
[im 15/34  brain]
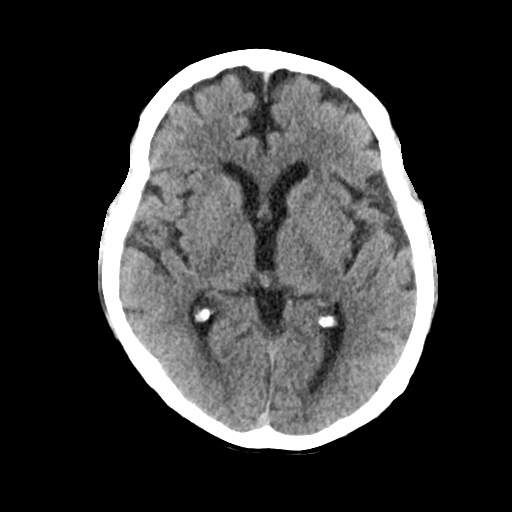
[im 19/34  brain]
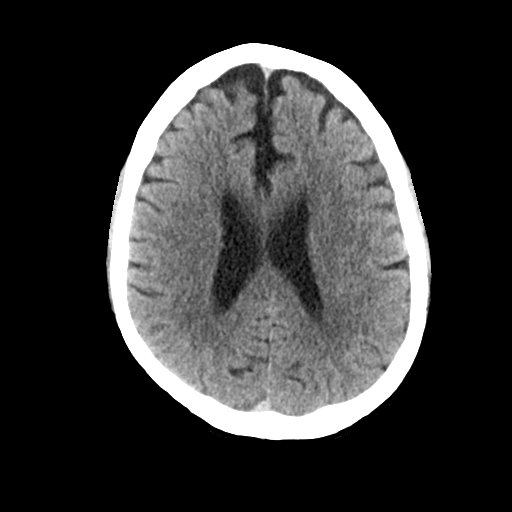
[im 19/34  bone]
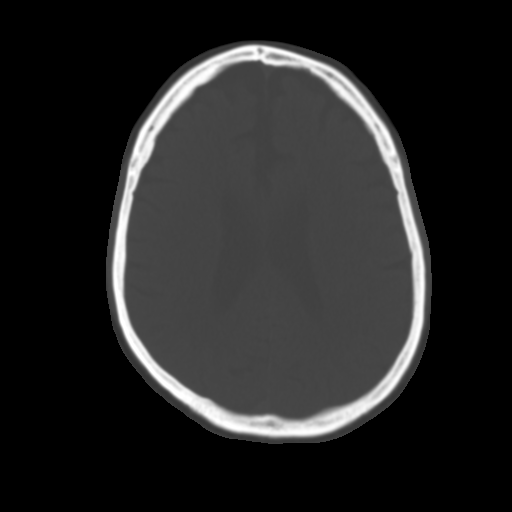
[im 23/34  brain]
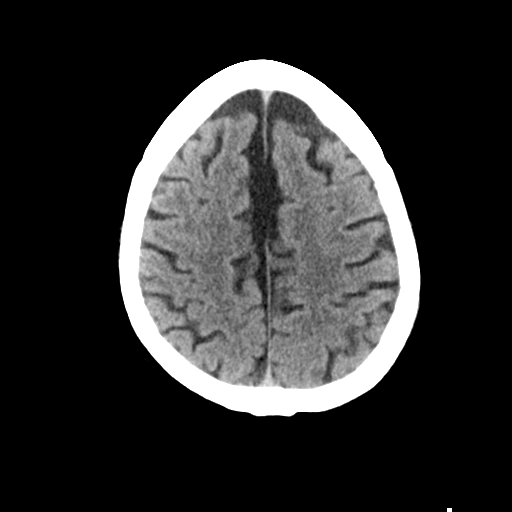
[im 27/34  brain]
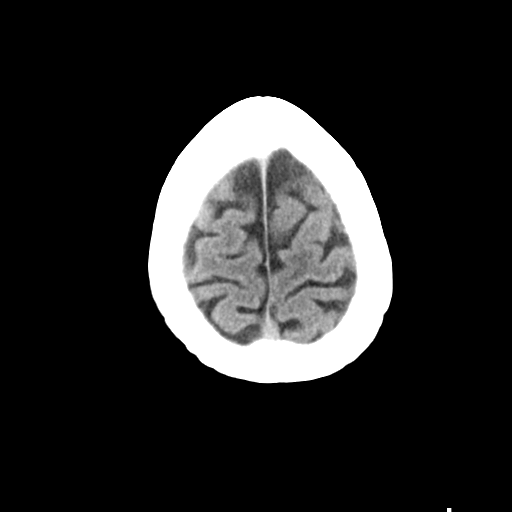
[im 31/34  brain]
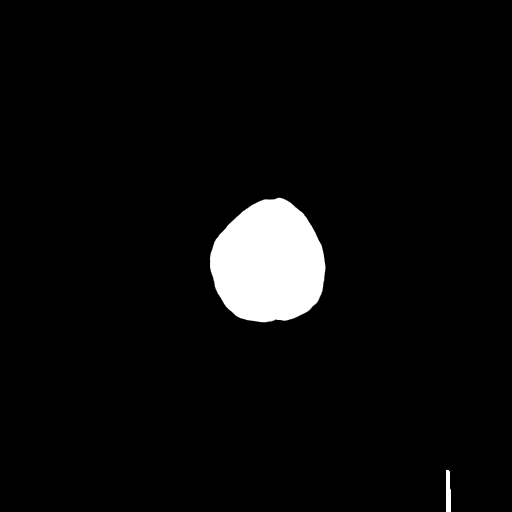

[Series 5: head 3.0 mpr sag · sagittal · 0.33mm/px · 3 of 59 slices shown]
[im 20/59  brain]
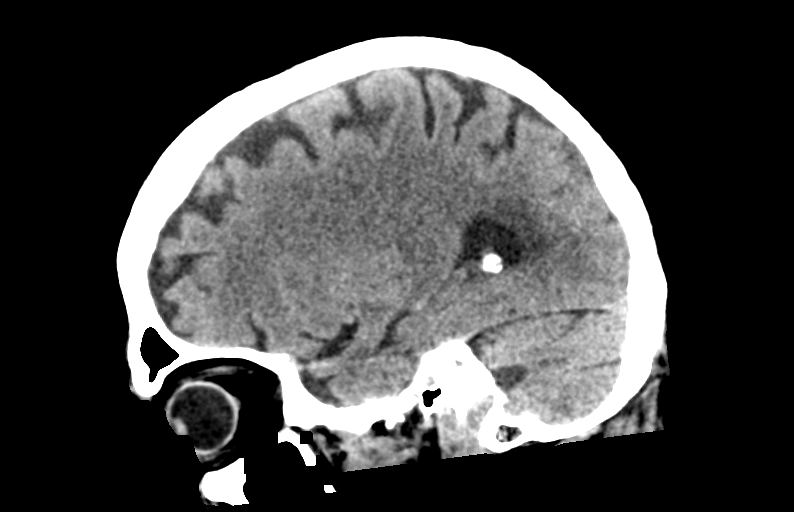
[im 30/59  brain]
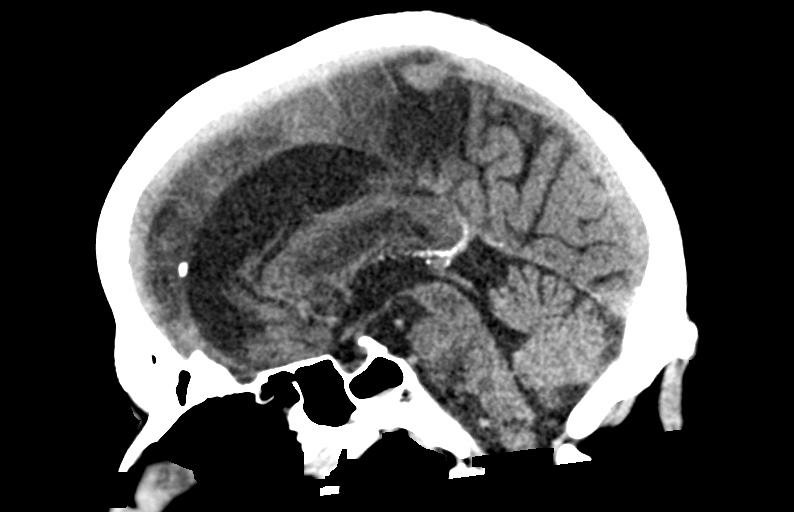
[im 39/59  brain]
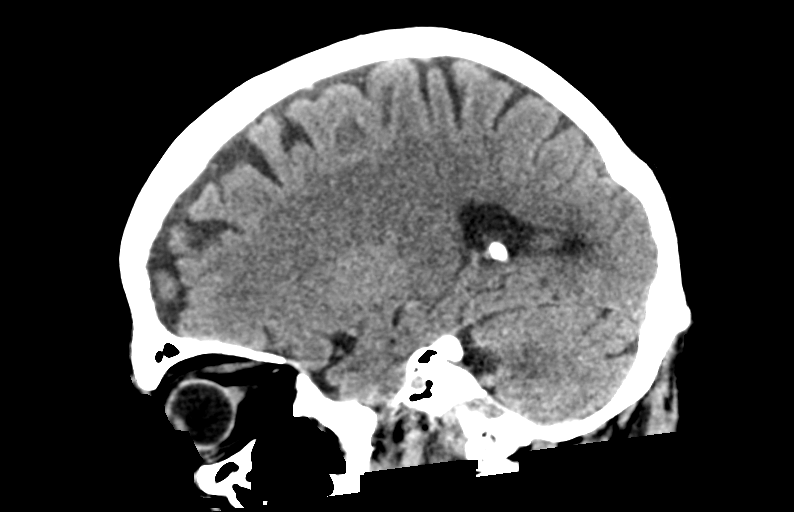

[Series 6: head 3.0 mpr cor · coronal · 0.33mm/px · 3 of 67 slices shown]
[im 23/67  brain]
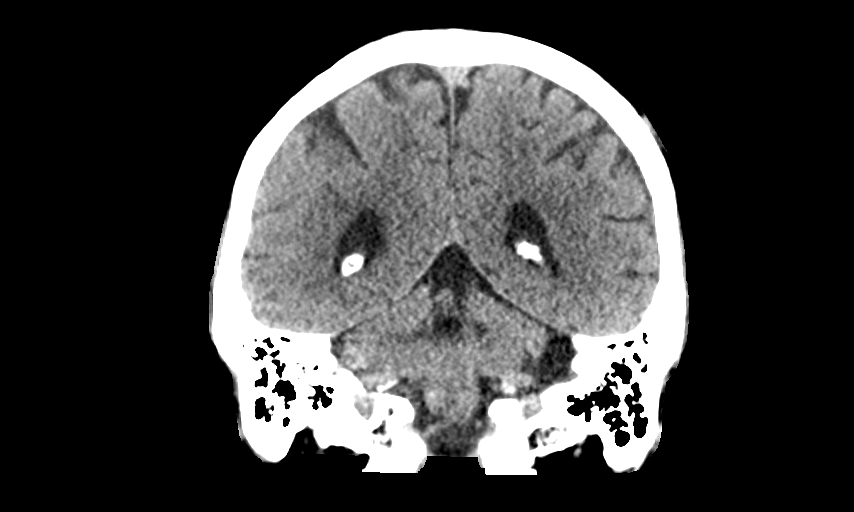
[im 30/67  brain]
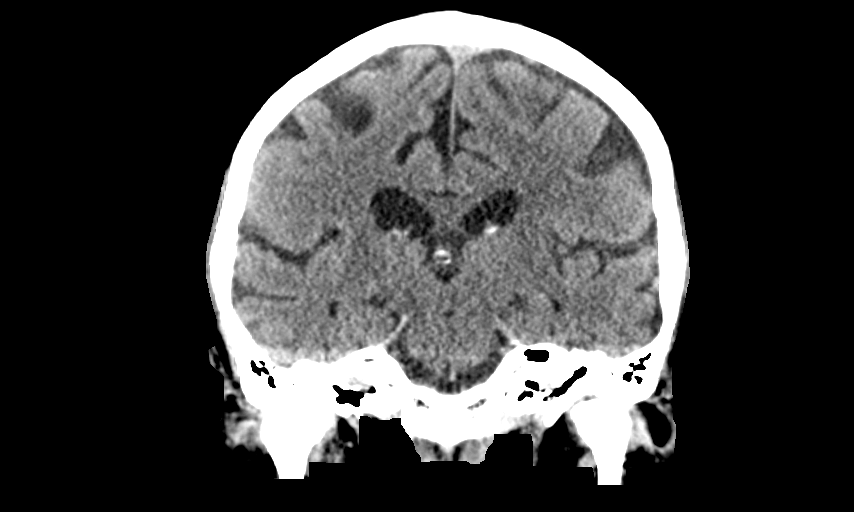
[im 37/67  brain]
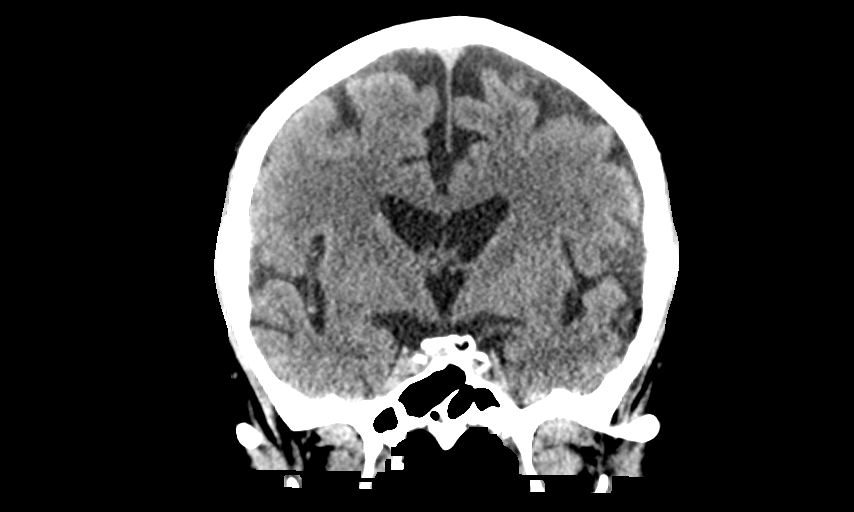

[14 of 47 positions shown; findings below may reference images not displayed]

FINDINGS: Brain: There is no evidence of acute intracranial hemorrhage,
extra-axial fluid collection, or infarct. The ventricles are stable
in size. No mass lesion is identified. There is no midline shift.

Vascular: There is calcification of the bilateral cavernous ICAs.

Skull: Normal. Negative for fracture or focal lesion.

Sinuses/Orbits: There is mucosal thickening in the right sphenoid
sinus. The globes and orbits are unremarkable.

Other: The mastoid air cells are clear.
IMPRESSION: Stable appearance of the brain with no acute intracranial pathology.

## 2020-09-28 MED ORDER — MECLIZINE HCL 25 MG PO TABS
12.5000 mg | ORAL_TABLET | Freq: Once | ORAL | Status: AC
  Administered 2020-09-28: 12.5 mg via ORAL
  Filled 2020-09-28: qty 1

## 2020-09-28 MED ORDER — ONDANSETRON 8 MG PO TBDP: 8.0000 mg | ORAL_TABLET | Freq: Three times a day (TID) | ORAL | 0 refills | Status: DC | PRN

## 2020-09-28 MED ORDER — ONDANSETRON 4 MG PO TBDP
8.0000 mg | ORAL_TABLET | Freq: Once | ORAL | Status: AC
  Administered 2020-09-28: 8 mg via ORAL
  Filled 2020-09-28: qty 2

## 2020-09-28 MED ORDER — MECLIZINE HCL 12.5 MG PO TABS: 12.5000 mg | ORAL_TABLET | Freq: Three times a day (TID) | ORAL | 0 refills | Status: DC | PRN

## 2020-09-28 NOTE — ED Provider Notes (Signed)
Emergency Medicine Provider Triage Evaluation Note  [name] , a 87 y.o. male  was evaluated in triage.  Pt complains of vomiting.  According to records where history was obtained, patient had a couple episodes of vomiting, unable to keep anything down.  Did have a fall on August 20, had a CT which was negative for any intracranial pathology then.  Review of Systems  Positive: Nausea, vomiting Negative: Headache, abdominal pain  Physical Exam  BP (!) 193/73 (BP Location: Left Arm)   Pulse 69   Temp 97.9 F (36.6 C)   Resp 16   SpO2 97%  Gen:   Awake, no distress   Resp:  Normal effort  MSK:   Moves extremities without difficulty  Other:  Pupils are equal and reactive.  Moves upper and lower extremities well.  Appropriate to questioning.  Medical Decision Making  Medically screening exam initiated at 2:40 PM.  Appropriate orders placed.  [name] was informed that the remainder of the evaluation will be completed by another provider, this initial triage assessment does not replace that evaluation, and the importance of remaining in the ED until their evaluation is complete.  Patient here status post traumatic head injury 4 days ago with recurrent episodes of vomiting along with not at baseline according to wife at home.  CT was -4 days ago.  Will obtain lab work in order to check kidney function prior to CT angio.  We will also obtain CT head without contrast to begin work-up.   Janeece Fitting, PA-C 09/28/20 1446    Daleen Bo, MD 09/28/20 1600

## 2020-09-28 NOTE — ED Triage Notes (Signed)
Pt here from home with c/o n/v and h/a , pt fell on the 20th and was seen at [facility] , hx of  bleed

## 2020-09-28 NOTE — ED Notes (Signed)
Water given  

## 2020-09-28 NOTE — Discharge Instructions (Addendum)
It was our pleasure to provide your ER care today - we hope that you feel better.  Drink plenty of fluids/stay well hydrated. Fall precautions.   Take antivert as need for dizziness - may make drowsy. Take zofran as need if nauseated.   Follow up with primary care doctor in one week if symptoms fail to improve/resolve. Also, your blood pressure is high tonight - continue your blood pressure meds, limit salt intake, and follow up with primary care doctor in the coming week for recheck.   Return to ER if worse, new symptoms, fevers, new or severe pain, severe dizziness, persistent vomiting, trouble breathing, numbness/weakness, change in speech or vision, or other concern.

## 2020-09-28 NOTE — ED Provider Notes (Signed)
[facility] Provider Note   CSN: [id number] Arrival date & time: 09/28/20  1318     History Chief Complaint  Patient presents with   Emesis    [name] is a 87 y.o. male.  Patient with nausea and vomiting onset today. Pt indicates was associated with sense of dizziness ?mild room spinning. Notes hx vertigo after prior head contusion, and states was recently seen in ED s/p and head contusion. Denies hearing loss or tinnitus. No bloody or bilious emesis. No abd pain or distension. Having bms. No dysuria or gu c/o. No chest pain or sob. No fever or chills. Mild headache earlier, no abrupt or severe head pain today. No change in speech or vision. No numbness/weakness or loss of normal functional ability. No anticoag use. + hx dementia.   The history is provided by the patient, a relative and medical records.      Past Medical History:  Diagnosis Date   Acute blood loss anemia    Acute upper respiratory infections of unspecified site 11/04/2012   AKI (acute kidney injury)    Benign prostatic hyperplasia    CAD (coronary artery disease)    s/p cypher DES to pLAD 6/08; normal LVF;  ETT-Myoview 2009: no ischemia    Clavicle fracture 08/10/2019   Closed displaced fracture of phalanx of left thumb, sequela 08/10/2019   Coronary atherosclerosis of native coronary artery 03/08/2008   Eosinophilia    Essential hypertension    Fracture of multiple ribs with pain 08/10/2019   History of multiple falls    History of SAH (subarachnoid hemorrhage) 07/14/2019   Hyperlipidemia type IIB / III 03/08/2008   Major neurocognitive disorder due to possible Alzheimer's disease, without behavioral disturbance 02/29/2020   MI (myocardial infarction)    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   Imaging revealed SAH   Nocturnal leg cramps 11/23/2012   Orthostatic hypotension 03/08/2008   Pain in joint, shoulder region  11/23/2012   Stage 3b chronic kidney disease    Thrombocytopenia    Trigeminal neuralgia    Type 2 diabetes mellitus     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    Multiple trauma    Benign prostatic hyperplasia    Essential hypertension    Stage 3b chronic kidney disease (HCC)    Thrombocytopenia    History of multiple falls 07/14/2019   History of SAH (subarachnoid hemorrhage) 07/14/2019   Type 2 diabetes mellitus    Eosinophilia    Nocturnal leg cramps 11/23/2012   Pain in joint, shoulder region 11/23/2012   Hematoma 11/04/2012   Hyperlipidemia type IIB / III 03/08/2008   Orthostatic hypotension 03/08/2008   Coronary atherosclerosis of native coronary artery 03/08/2008    Past Surgical History:  Procedure Laterality Date   APPENDECTOMY     CARDIAC CATHETERIZATION  07/30/2006   CORONARY ANGIOPLASTY WITH STENT PLACEMENT   CARDIAC CATHETERIZATION     EXPLORATORY LAPAROTOMY     age 37   EXPLORATORY LAPAROTOMY     IR THORACENTESIS ASP PLEURAL SPACE W/IMG GUIDE  10/05/2019       Family History  Problem Relation Age of Onset   Stomach cancer  Mother    Memory loss Mother    Heart disease Father    Coronary artery disease Father 40   Heart attack Father    Heart disease Brother    Aortic aneurysm Brother    Colon cancer Brother    Aortic aneurysm Brother    Colon cancer Brother    Arthritis Daughter        rheumatoid   Coronary artery disease Brother    Esophageal cancer Neg Hx    Rectal cancer Neg Hx     Social History   Tobacco Use   Smoking status: Never   Smokeless tobacco: Never  Vaping Use   Vaping Use: Never used  Substance Use Topics   Alcohol use: Not  Currently   Drug use: Never    Home Medications Prior to Admission medications   Medication Sig Start Date End Date Taking? Authorizing Provider  albuterol (PROAIR HFA) 108 (90 Base) MCG/ACT inhaler INHALE 2 PUFFS INTO THE LUNGS EVERY 6 HOURS AS NEEDED FOR WHEEZING OR SHORTNESS OF BREATH Patient taking differently: Inhale 2 puffs into the lungs every 6 (six) hours as needed for wheezing or shortness of breath. 08/06/19   Angiulli, Lavon Paganini, PA-C  amLODipine (NORVASC) 5 MG tablet Take 1 tablet (5 mg total) by mouth daily. 11/25/19 11/19/20  Sherren Mocha, MD  Apoaequorin [facility] EXTRA STRENGTH PO) Take 1 tablet by mouth daily.    [provider]  atorvastatin (LIPITOR) 20 MG tablet Take 1 tablet (20 mg total) by mouth daily. 08/22/20   [name], MD  BREO ELLIPTA 200-25 MCG/INH AEPB INHALE 1 PUFF BY MOUTH EVERY DAY 09/19/20   [name], MD  Calcium Carb-Cholecalciferol (CALCIUM 600+D3 PO) Take 1 tablet by mouth 2 (two) times daily.    [provider]  doxazosin (CARDURA) 2 MG tablet TAKE 1 TABLET BY MOUTH EVERY DAY Patient taking differently: Take 2 mg by mouth daily. 11/30/19   [name], MD  escitalopram (LEXAPRO) 10 MG tablet Take 1 tablet (10 mg total) by mouth daily. 07/14/20   [name], MD  ezetimibe (ZETIA) 10 MG tablet TAKE 1 TABLET BY MOUTH EVERY DAY Patient taking differently: Take 10 mg by mouth at bedtime. 11/30/19   Sherren Mocha, MD  loratadine (CLARITIN) 10 MG tablet Take 10 mg by mouth daily as needed for allergies or rhinitis.     [provider]  Multiple Vitamin (MULTIVITAMIN WITH MINERALS) TABS tablet Take 1 tablet by mouth daily.    [provider]  rivastigmine (EXELON) 1.5 MG capsule Take 1 capsule daily for 1 week, then increase to 1 capsule twice a day 07/08/20   Cameron Sprang, MD  terbinafine (LAMISIL) 250 MG tablet Take 1 tablet (250 mg total) by mouth daily. 08/22/20   [name], MD     Allergies    Gabapentin  Review of Systems   Review of Systems  Constitutional:  Negative for chills and fever.  HENT:  Negative for sore throat.   Eyes:  Negative for redness.  Respiratory:  Negative for shortness of breath.   Cardiovascular:  Negative for chest pain.  Gastrointestinal:  Positive for nausea and vomiting. Negative for abdominal pain.  Genitourinary:  Negative for dysuria and flank pain.  Musculoskeletal:  Negative for back pain and neck pain.  Skin:  Negative for rash.  Neurological:  Positive for dizziness. Negative for syncope, speech difficulty, weakness, numbness and headaches.  Hematological:  Does not bruise/bleed easily.  Psychiatric/Behavioral:  Negative for agitation.    Physical Exam Updated Vital Signs BP (!) 174/81 (BP Location: Left Arm)   Pulse 73   Temp (!) 97.5 F (36.4 C)   Resp 16   SpO2 98%   Physical Exam Vitals and nursing note reviewed.  Constitutional:      Appearance: Normal appearance. He is well-developed.  HENT:     Head: Atraumatic.     Right Ear: Tympanic membrane normal.     Left Ear: Tympanic membrane normal.     Nose: Nose normal.     Mouth/Throat:     Mouth: Mucous membranes are moist.     Pharynx: Oropharynx is clear.  Eyes:     General: No scleral icterus.    Conjunctiva/sclera: Conjunctivae normal.     Pupils: Pupils are equal, round, and reactive to light.  Neck:     Vascular: No carotid bruit.     Trachea: No tracheal deviation.  Cardiovascular:     Rate and Rhythm: Normal rate and regular rhythm.     Pulses: Normal pulses.     Heart sounds: Normal heart sounds. No murmur heard.   No friction rub. No gallop.  Pulmonary:     Effort: Pulmonary effort is normal. No accessory muscle usage or respiratory distress.     Breath sounds: Normal breath sounds.  Abdominal:     General: Bowel sounds are normal. There is no distension.     Palpations: Abdomen is soft.     Tenderness: There is no abdominal  tenderness.  Genitourinary:    Comments: No cva tenderness. Musculoskeletal:        General: No swelling or tenderness.     Cervical back: Normal range of motion and neck supple. No rigidity.  Skin:    General: Skin is warm and dry.     Findings: No rash.  Neurological:     Mental Status: He is alert.     Comments: Alert, speech clear. No dysarthria or aphasia. Motor intact bil, stre 5/5. No pronator drift. Sens grossly intact.   Psychiatric:        Mood and Affect: Mood normal.    ED Results / Procedures / Treatments   Labs (all labs ordered are listed, but only abnormal results are displayed) Results for orders placed or performed during the hospital encounter of 09/28/20  CBC with Differential  Result Value Ref Range   WBC 9.4 4.0 - 10.5 K/uL   RBC 4.03 (L) 4.22 - 5.81 MIL/uL   Hemoglobin 12.7 (L) 13.0 - 17.0 g/dL   HCT 39.1 39.0 - 52.0 %   MCV 97.0 80.0 - 100.0 fL   MCH 31.5 26.0 - 34.0 pg   MCHC 32.5 30.0 - 36.0 g/dL   RDW 12.9 11.5 - 15.5 %   Platelets 186 150 - 400 K/uL   nRBC 0.0 0.0 - 0.2 %   Neutrophils Relative % 92 %   Neutro Abs 8.5 (H) 1.7 - 7.7 K/uL   Lymphocytes Relative 3 %   Lymphs Abs 0.3 (L) 0.7 - 4.0 K/uL   Monocytes Relative 5 %   Monocytes Absolute 0.5 0.1 - 1.0 K/uL   Eosinophils Relative 0 %   Eosinophils Absolute 0.0 0.0 - 0.5 K/uL   Basophils Relative 0 %   Basophils Absolute 0.0 0.0 - 0.1 K/uL   Immature Granulocytes 0 %   Abs Immature Granulocytes 0.04 0.00 - 0.07 K/uL  Comprehensive metabolic panel  Result Value Ref Range  Sodium 137 135 - 145 mmol/L   Potassium 3.6 3.5 - 5.1 mmol/L   Chloride 103 98 - 111 mmol/L   CO2 25 22 - 32 mmol/L   Glucose, Bld 155 (H) 70 - 99 mg/dL   BUN 33 (H) 8 - 23 mg/dL   Creatinine, Ser 1.80 (H) 0.61 - 1.24 mg/dL   Calcium 9.0 8.9 - 10.3 mg/dL   Total Protein 7.2 6.5 - 8.1 g/dL   Albumin 3.9 3.5 - 5.0 g/dL   AST 44 (H) 15 - 41 U/L   ALT 26 0 - 44 U/L   Alkaline Phosphatase 97 38 - 126 U/L   Total  Bilirubin 1.1 0.3 - 1.2 mg/dL   GFR, Estimated 36 (L) >60 mL/min   Anion gap 9 5 - 15   DG Ribs Unilateral W/Chest Left  Result Date: 09/24/2020 CLINICAL DATA:  Fall with third rib fracture noted on shoulder x-ray EXAM: LEFT RIBS AND CHEST - 3+ VIEW COMPARISON:  02/14/2020 FINDINGS: Chronic small left pleural effusion with adjacent scarring. The left third rib fracture is chronic when compared with prior imaging. Cardiomegaly. No edema or pneumothorax. IMPRESSION: 1. Remote left third rib fracture. 2. Chronic small left pleural effusion. Electronically Signed   By: Monte Fantasia M.D.   On: 09/24/2020 08:55   CT HEAD WO CONTRAST (5MM)  Result Date: 09/28/2020 CLINICAL DATA:  Trauma EXAM: CT HEAD WITHOUT CONTRAST TECHNIQUE: Contiguous axial images were obtained from the base of the skull through the vertex without intravenous contrast. COMPARISON:  Brain MRI 04/15/2020, CT head 09/24/2020 FINDINGS: Brain: There is no evidence of acute intracranial hemorrhage, extra-axial fluid collection, or infarct. The ventricles are stable in size. No mass lesion is identified. There is no midline shift. Vascular: There is calcification of the bilateral cavernous ICAs. Skull: Normal. Negative for fracture or focal lesion. Sinuses/Orbits: There is mucosal thickening in the right sphenoid sinus. The globes and orbits are unremarkable. Other: The mastoid air cells are clear. IMPRESSION: Stable appearance of the brain with no acute intracranial pathology. Electronically Signed   By: Valetta Mole M.D.   On: 09/28/2020 15:38   CT HEAD WO CONTRAST (5MM)  Result Date: 09/24/2020 CLINICAL DATA:  Fall.  Head pain.  Jaw pain. EXAM: CT HEAD WITHOUT CONTRAST TECHNIQUE: Contiguous axial images were obtained from the base of the skull through the vertex without intravenous contrast. COMPARISON:  08/31/2020 FINDINGS: Brain: Normal cerebral and cerebellar volume for age. No mass lesion, hemorrhage, hydrocephalus, acute infarct,  intra-axial, or extra-axial fluid collection. Vascular: No hyperdense vessel or unexpected calcification. Intracranial atherosclerosis. Skull: No significant soft tissue swelling.  No skull fracture. Sinuses/Orbits: Surgical changes about both globes. Minimal mucosal thickening of sphenoid sinus. Clear mastoid air cells. Other: None. IMPRESSION: Normal head CT for age. Minimal sinus disease. Electronically Signed   By: Abigail Miyamoto M.D.   On: 09/24/2020 08:03   CT Head Wo Contrast  Result Date: 08/31/2020 CLINICAL DATA:  Head trauma, minor EXAM: CT HEAD WITHOUT CONTRAST TECHNIQUE: Contiguous axial images were obtained from the base of the skull through the vertex without intravenous contrast. COMPARISON:  None. FINDINGS: Brain: There is no acute intracranial hemorrhage, mass effect, or edema. Gray-white differentiation is preserved. There is no extra-axial fluid collection. Prominence of the ventricles sulci reflects mild generalized parenchymal volume loss. Patchy low-attenuation in the supratentorial white matter is nonspecific but may reflect minor chronic microvascular ischemic changes. Vascular: There is atherosclerotic calcification at the skull base. Skull: Calvarium is unremarkable. Sinuses/Orbits:  Patchy mucosal thickening.  Orbits are unremarkable. Other: None. IMPRESSION: No evidence of acute intracranial injury. Electronically Signed   By: Macy Mis M.D.   On: 08/31/2020 14:23   CT Cervical Spine Wo Contrast  Result Date: 09/28/2020 CLINICAL DATA:  Neck trauma (Age >= 65y) Frequent falls. EXAM: CT CERVICAL SPINE WITHOUT CONTRAST TECHNIQUE: Multidetector CT imaging of the cervical spine was performed without intravenous contrast. Multiplanar CT image reconstructions were also generated. COMPARISON:  Cervical MRI 04/15/2020, most recent CT 12/24/2019 FINDINGS: Alignment: Normal. Skull base and vertebrae: No acute fracture. Mild chronic wedging of C7 and T1, stable. Minimal sclerosis involving  right inferior C3 endplate is also unchanged, likely degenerative. The dens and skull base are intact. Soft tissues and spinal canal: No prevertebral fluid or swelling. No visible canal hematoma. Disc levels: Multilevel degenerative disc disease and facet hypertrophy. Degenerative changes most prominent at C4-C5. No significant change from prior exam. Upper chest: No acute or unexpected findings. Small bilateral thyroid nodules are all less than 1.5 cm. Not clinically significant; no follow-up imaging recommended (ref: J Am Coll Radiol. 2015 Feb;12(2): 143-50). Other: Carotid calcifications. IMPRESSION: 1. No acute fracture or subluxation of the cervical spine. 2. Multilevel degenerative disc disease and facet hypertrophy. Electronically Signed   By: Keith Rake M.D.   On: 09/28/2020 15:36   DG Shoulder Left  Result Date: 09/24/2020 CLINICAL DATA:  pain after fall EXAM: LEFT SHOULDER - 2+ VIEW COMPARISON:  None. FINDINGS: Minimally displaced fracture of the left third rib. No additional fractures or dislocations. IMPRESSION: 1. No acute fracture or dislocation of the left shoulder. 2. Minimally displaced fracture of the posterior left third rib. Electronically Signed   By: Miachel Roux M.D.   On: 09/24/2020 08:04     EKG None  Radiology CT HEAD WO CONTRAST (5MM)  Result Date: 09/28/2020 CLINICAL DATA:  Trauma EXAM: CT HEAD WITHOUT CONTRAST TECHNIQUE: Contiguous axial images were obtained from the base of the skull through the vertex without intravenous contrast. COMPARISON:  Brain MRI 04/15/2020, CT head 09/24/2020 FINDINGS: Brain: There is no evidence of acute intracranial hemorrhage, extra-axial fluid collection, or infarct. The ventricles are stable in size. No mass lesion is identified. There is no midline shift. Vascular: There is calcification of the bilateral cavernous ICAs. Skull: Normal. Negative for fracture or focal lesion. Sinuses/Orbits: There is mucosal thickening in the right  sphenoid sinus. The globes and orbits are unremarkable. Other: The mastoid air cells are clear. IMPRESSION: Stable appearance of the brain with no acute intracranial pathology. Electronically Signed   By: Valetta Mole M.D.   On: 09/28/2020 15:38   CT Cervical Spine Wo Contrast  Result Date: 09/28/2020 CLINICAL DATA:  Neck trauma (Age >= 65y) Frequent falls. EXAM: CT CERVICAL SPINE WITHOUT CONTRAST TECHNIQUE: Multidetector CT imaging of the cervical spine was performed without intravenous contrast. Multiplanar CT image reconstructions were also generated. COMPARISON:  Cervical MRI 04/15/2020, most recent CT 12/24/2019 FINDINGS: Alignment: Normal. Skull base and vertebrae: No acute fracture. Mild chronic wedging of C7 and T1, stable. Minimal sclerosis involving right inferior C3 endplate is also unchanged, likely degenerative. The dens and skull base are intact. Soft tissues and spinal canal: No prevertebral fluid or swelling. No visible canal hematoma. Disc levels: Multilevel degenerative disc disease and facet hypertrophy. Degenerative changes most prominent at C4-C5. No significant change from prior exam. Upper chest: No acute or unexpected findings. Small bilateral thyroid nodules are all less than 1.5 cm. Not clinically significant; no follow-up imaging  recommended (ref: J Am Coll Radiol. 2015 Feb;12(2): 143-50). Other: Carotid calcifications. IMPRESSION: 1. No acute fracture or subluxation of the cervical spine. 2. Multilevel degenerative disc disease and facet hypertrophy. Electronically Signed   By: Keith Rake M.D.   On: 09/28/2020 15:36    Procedures Procedures   Medications Ordered in ED Medications  meclizine (ANTIVERT) tablet 12.5 mg (has no administration in time range)  ondansetron (ZOFRAN-ODT) disintegrating tablet 8 mg (has no administration in time range)    ED Course  I have reviewed the triage vital signs and the nursing notes.  Pertinent labs & imaging results that were  available during my care of the patient were reviewed by me and considered in my medical decision making (see chart for details).    MDM Rules/Calculators/A&P                          Iv ns. Stat labs. Imaging.   Reviewed nursing notes and prior charts for additional history. Recent imaging reviewed/negative acute.   Labs reviewed/interpreted by me - wbc normal, hct normal.   CT reviewed/interpreted by me - no hem.   Antivert po, zofran po. Po fluids. Ambulate in hall.   Recheck pt - states feels fine - no pain, no nausea/vomiting, no dizziness.   Pt currently appears stable for d/c.   Return precautions provided.      Final Clinical Impression(s) / ED Diagnoses Final diagnoses:  None    Rx / DC Orders ED Discharge Orders     None        Lajean Saver, MD 09/28/20 2221

## 2020-09-28 NOTE — Telephone Encounter (Signed)
Telephone call form pt wife, Pt had a fall on Saturday. Pt seen in the ED, CT normal. Pt sent home.  Per the Wife pt been in the rest room for hours. For two of the hours he been more confused, Vomiting. Unable to keep food or water down with out vomiting.  Advised Wife to take the Patient back to the ED to make sure.   Will let Dr.Aqunio know pt on his way to the hospital.

## 2020-09-30 ENCOUNTER — Encounter: Payer: Self-pay | Admitting: Family Medicine

## 2020-10-03 DIAGNOSIS — Z8669 Personal history of other diseases of the nervous system and sense organs: Secondary | ICD-10-CM | POA: Diagnosis not present

## 2020-10-03 DIAGNOSIS — Z8719 Personal history of other diseases of the digestive system: Secondary | ICD-10-CM | POA: Diagnosis not present

## 2020-10-03 DIAGNOSIS — Z09 Encounter for follow-up examination after completed treatment for conditions other than malignant neoplasm: Secondary | ICD-10-CM | POA: Diagnosis not present

## 2020-10-11 DIAGNOSIS — F0280 Dementia in other diseases classified elsewhere without behavioral disturbance: Secondary | ICD-10-CM | POA: Diagnosis not present

## 2020-10-11 DIAGNOSIS — Z8679 Personal history of other diseases of the circulatory system: Secondary | ICD-10-CM | POA: Diagnosis not present

## 2020-10-11 DIAGNOSIS — G20 Parkinson's disease: Secondary | ICD-10-CM | POA: Diagnosis not present

## 2020-10-11 DIAGNOSIS — R110 Nausea: Secondary | ICD-10-CM | POA: Diagnosis not present

## 2020-10-11 DIAGNOSIS — R519 Headache, unspecified: Secondary | ICD-10-CM | POA: Diagnosis not present

## 2020-10-11 DIAGNOSIS — W19XXXA Unspecified fall, initial encounter: Secondary | ICD-10-CM | POA: Diagnosis not present

## 2020-10-11 DIAGNOSIS — S4992XA Unspecified injury of left shoulder and upper arm, initial encounter: Secondary | ICD-10-CM | POA: Diagnosis not present

## 2020-10-11 DIAGNOSIS — R42 Dizziness and giddiness: Secondary | ICD-10-CM | POA: Diagnosis not present

## 2020-10-11 DIAGNOSIS — N390 Urinary tract infection, site not specified: Secondary | ICD-10-CM | POA: Diagnosis not present

## 2020-10-12 ENCOUNTER — Other Ambulatory Visit: Payer: Self-pay

## 2020-10-12 DIAGNOSIS — R42 Dizziness and giddiness: Secondary | ICD-10-CM

## 2020-10-17 DIAGNOSIS — Z7952 Long term (current) use of systemic steroids: Secondary | ICD-10-CM | POA: Diagnosis not present

## 2020-10-17 DIAGNOSIS — J069 Acute upper respiratory infection, unspecified: Secondary | ICD-10-CM | POA: Diagnosis not present

## 2020-10-17 DIAGNOSIS — Z8709 Personal history of other diseases of the respiratory system: Secondary | ICD-10-CM | POA: Diagnosis not present

## 2020-10-18 ENCOUNTER — Ambulatory Visit (INDEPENDENT_AMBULATORY_CARE_PROVIDER_SITE_OTHER): Payer: PPO | Admitting: Family Medicine

## 2020-10-18 ENCOUNTER — Other Ambulatory Visit: Payer: Self-pay

## 2020-10-18 VITALS — BP 128/76 | HR 72 | Temp 98.3°F | Resp 16

## 2020-10-18 DIAGNOSIS — J029 Acute pharyngitis, unspecified: Secondary | ICD-10-CM

## 2020-10-18 DIAGNOSIS — R509 Fever, unspecified: Secondary | ICD-10-CM | POA: Diagnosis not present

## 2020-10-18 DIAGNOSIS — Z20822 Contact with and (suspected) exposure to covid-19: Secondary | ICD-10-CM

## 2020-10-18 LAB — INFLUENZA A AND B AG, IMMUNOASSAY
INFLUENZA A ANTIGEN: NOT DETECTED
INFLUENZA B ANTIGEN: NOT DETECTED

## 2020-10-18 MED ORDER — MOLNUPIRAVIR EUA 200MG CAPSULE: 4.0000 | ORAL_CAPSULE | Freq: Two times a day (BID) | ORAL | 0 refills | Status: AC

## 2020-10-18 NOTE — Progress Notes (Signed)
Subjective:    Patient ID: [name], male    DOB: 1934/01/09, 87 y.o.   MRN: [id number]  HPI  Patient is a very frail 87 year old Caucasian gentleman with a history of coronary artery disease, chronic kidney disease with a GFR approaching 30, and dementia.  He has been dealing with an upper respiratory tract infection for the last 3 days.  Symptoms began with a sore throat and progressed to head congestion and coughing.  He is also having a low-grade fever and is having night sweats.  Several members of his family have had the same symptoms.  Symptoms sound consistent with a viral upper respiratory tract infection.  They are concerned about COVID particular given his frail health.  Exam today is performed in our parking lot and is relatively unremarkable including an oxygen saturation of 96%.  His lungs sound completely clear on exam Past Medical History:  Diagnosis Date   Acute blood loss anemia    Acute upper respiratory infections of unspecified site 11/04/2012   AKI (acute kidney injury)    Benign prostatic hyperplasia    CAD (coronary artery disease)    s/p cypher DES to pLAD 6/08; normal LVF;  ETT-Myoview 2009: no ischemia    Clavicle fracture 08/10/2019   Closed displaced fracture of phalanx of left thumb, sequela 08/10/2019   Coronary atherosclerosis of native coronary artery 03/08/2008   Eosinophilia    Essential hypertension    Fracture of multiple ribs with pain 08/10/2019   History of multiple falls    History of SAH (subarachnoid hemorrhage) 07/14/2019   Hyperlipidemia type IIB / III 03/08/2008   Major neurocognitive disorder due to possible Alzheimer's disease, without behavioral disturbance 02/29/2020   MI (myocardial infarction)    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   Imaging revealed SAH   Nocturnal leg cramps 11/23/2012   Orthostatic hypotension 03/08/2008   Pain in joint, shoulder region 11/23/2012   Stage 3b chronic kidney  disease    Thrombocytopenia    Trigeminal neuralgia    Type 2 diabetes mellitus    Past Surgical History:  Procedure Laterality Date   APPENDECTOMY     CARDIAC CATHETERIZATION  07/30/2006   CORONARY ANGIOPLASTY WITH STENT PLACEMENT   CARDIAC CATHETERIZATION     EXPLORATORY LAPAROTOMY     age 23   EXPLORATORY LAPAROTOMY     IR THORACENTESIS ASP PLEURAL SPACE W/IMG GUIDE  10/05/2019   Current Outpatient Medications on File Prior to Visit  Medication Sig Dispense Refill   albuterol (PROAIR HFA) 108 (90 Base) MCG/ACT inhaler INHALE 2 PUFFS INTO THE LUNGS EVERY 6 HOURS AS NEEDED FOR WHEEZING OR SHORTNESS OF BREATH (Patient taking differently: Inhale 2 puffs into the lungs every 6 (six) hours as needed for wheezing or shortness of breath.) 6.7 g 0   amLODipine (NORVASC) 5 MG tablet Take 1 tablet (5 mg total) by mouth daily. 90 tablet 3   Apoaequorin (PREVAGEN EXTRA STRENGTH PO) Take 1 tablet by mouth daily.     atorvastatin (LIPITOR) 20 MG tablet Take 1 tablet (20 mg total) by mouth daily. 90 tablet 1   BREO ELLIPTA 200-25 MCG/INH AEPB INHALE 1 PUFF BY MOUTH EVERY DAY 60 each 1   Calcium Carb-Cholecalciferol (CALCIUM 600+D3 PO) Take 1 tablet by mouth 2 (two) times daily.     doxazosin (CARDURA) 2 MG tablet TAKE 1 TABLET BY MOUTH EVERY DAY (Patient taking differently: Take 2 mg by mouth daily.) 90 tablet 3  escitalopram (LEXAPRO) 10 MG tablet Take 1 tablet (10 mg total) by mouth daily. 90 tablet 2   ezetimibe (ZETIA) 10 MG tablet TAKE 1 TABLET BY MOUTH EVERY DAY (Patient taking differently: Take 10 mg by mouth at bedtime.) 90 tablet 3   loratadine (CLARITIN) 10 MG tablet Take 10 mg by mouth daily as needed for allergies or rhinitis.      meclizine (ANTIVERT) 12.5 MG tablet Take 1 tablet (12.5 mg total) by mouth 3 (three) times daily as needed for dizziness. 15 tablet 0   Multiple Vitamin (MULTIVITAMIN WITH MINERALS) TABS tablet Take 1 tablet by mouth daily.     ondansetron (ZOFRAN ODT) 8 MG  disintegrating tablet Take 1 tablet (8 mg total) by mouth every 8 (eight) hours as needed for nausea or vomiting. 10 tablet 0   rivastigmine (EXELON) 1.5 MG capsule Take 1 capsule daily for 1 week, then increase to 1 capsule twice a day 60 capsule 6   terbinafine (LAMISIL) 250 MG tablet Take 1 tablet (250 mg total) by mouth daily. 90 tablet 3   No current facility-administered medications on file prior to visit.   Allergies  Allergen Reactions   Gabapentin Other (See Comments)    Visual changes and confusion- "I went blind while I was driving"   Social History   Socioeconomic History   Marital status: Married    Spouse name: [name]   Number of children: 1   Years of education: 20   Highest education level: Occupational hygienist History   Occupation: retired professor    Comment: History    Occupation: missionary  Tobacco Use   Smoking status: Never   Smokeless tobacco: Never  Scientific laboratory technician Use: Never used  Substance and Sexual Activity   Alcohol use: Not Currently   Drug use: Never   Sexual activity: Yes    Partners: Female  Other Topics Concern   Not on file  Social History Narrative   ** Merged History Encounter **       Lives with his wife (second marriage in 2015, first marriage ended when his wife died alzheimer's disease). Since his remarriage, his adult daughter doesn't speak with him.      Right handed   Social Determinants of Health   Financial Resource Strain: Not on file  Food Insecurity: Not on file  Transportation Needs: Not on file  Physical Activity: Not on file  Stress: Not on file  Social Connections: Not on file  Intimate Partner Violence: Not on file   Family History  Problem Relation Age of Onset   Stomach cancer Mother    Memory loss Mother    Heart disease Father    Coronary artery disease Father 34   Heart attack Father    Heart disease Brother    Aortic aneurysm Brother    Colon cancer Brother    Aortic aneurysm Brother     Colon cancer Brother    Arthritis Daughter        rheumatoid   Coronary artery disease Brother    Esophageal cancer Neg Hx    Rectal cancer Neg Hx      Review of Systems  All other systems reviewed and are negative.     Objective:   Physical Exam Vitals reviewed.  Constitutional:      General: He is not in acute distress.    Appearance: He is well-developed. He is not diaphoretic.  HENT:     Right Ear: Tympanic membrane  and ear canal normal.     Left Ear: Tympanic membrane and ear canal normal.  Neck:     Thyroid: No thyromegaly.     Vascular: No JVD.     Trachea: No tracheal deviation.  Cardiovascular:     Rate and Rhythm: Normal rate and regular rhythm.     Heart sounds: Normal heart sounds. No murmur heard.   No friction rub. No gallop.  Pulmonary:     Effort: Pulmonary effort is normal. No accessory muscle usage, prolonged expiration or respiratory distress.     Breath sounds: No stridor. No wheezing or rales.  Musculoskeletal:     Cervical back: Neck supple. No erythema, rigidity, torticollis or crepitus. No pain with movement, spinous process tenderness or muscular tenderness. Normal range of motion.  Lymphadenopathy:     Cervical: No cervical adenopathy.  Neurological:     Mental Status: He is alert.     Cranial Nerves: Cranial nerves are intact.     Motor: No abnormal muscle tone.     Deep Tendon Reflexes: Reflexes are normal and symmetric.          Assessment & Plan:  Fever, unspecified fever cause - Plan: SARS-COV-2 RNA,(COVID-19) QUAL NAAT, Influenza A and B Ag, Immunoassay, STREP GROUP A AG, W/REFLEX TO CULT  Sore throat - Plan: SARS-COV-2 RNA,(COVID-19) QUAL NAAT, Influenza A and B Ag, Immunoassay, STREP GROUP A AG, W/REFLEX TO CULT  Encounter for laboratory testing for COVID-19 virus - Plan: SARS-COV-2 RNA,(COVID-19) QUAL NAAT, Influenza A and B Ag, Immunoassay, STREP GROUP A AG, W/REFLEX TO CULT I feel that this is likely a viral upper respiratory  infection.  The question is whether this is COVID.  I performed a flu test, strep test as well as a COVID test.  While awaiting the results of the COVID test I will start the patient on molunipiravir 800 mg twice a day until test results return.  I am treating the patient preemptively due to the fact he is very frail and weak and symptoms certainly could be due to COVID

## 2020-10-19 LAB — SARS-COV-2 RNA,(COVID-19) QUALITATIVE NAAT: SARS CoV2 RNA: NOT DETECTED

## 2020-10-20 ENCOUNTER — Ambulatory Visit: Payer: PPO | Admitting: Physical Therapy

## 2020-10-20 LAB — CULTURE, GROUP A STREP
MICRO NUMBER:: 12366574
SPECIMEN QUALITY:: ADEQUATE

## 2020-10-20 LAB — STREP GROUP A AG, W/REFLEX TO CULT: Streptococcus Group A AG: NOT DETECTED

## 2020-10-26 ENCOUNTER — Telehealth: Payer: Self-pay | Admitting: Family Medicine

## 2020-10-26 NOTE — Progress Notes (Signed)
  Chronic Care Management   Note  10/26/2020 Name: [name] MRN: [id number] DOB: 01-08-1934  [name] is a 87 y.o. year old male who is a primary care patient of Pickard, Cammie Mcgee, MD. I reached out to [employer] by phone today in response to a referral sent by Mr. Kush Farabee Gittens's PCP, [name], MD.   [name] was given information about Chronic Care Management services today including:  CCM service includes personalized support from designated clinical staff supervised by his physician, including individualized plan of care and coordination with other care providers 24/7 contact phone numbers for assistance for urgent and routine care needs. Service will only be billed when office clinical staff spend 20 minutes or more in a month to coordinate care. Only one practitioner may furnish and bill the service in a calendar month. The patient may stop CCM services at any time (effective at the end of the month) by phone call to the office staff.   Patient agreed to services and verbal consent obtained.   Follow up plan:   Tatjana Secretary/administrator

## 2020-10-26 NOTE — Progress Notes (Signed)
  Chronic Care Management   Outreach Note  10/26/2020 Name: [name] MRN: [id number] DOB: December 28, 1933  Referred by: [name], MD Reason for referral : No chief complaint on file.   An unsuccessful telephone outreach was attempted today. The patient was referred to the pharmacist for assistance with care management and care coordination.   Follow Up Plan:   Tatjana Dellinger Upstream Scheduler

## 2020-11-01 ENCOUNTER — Ambulatory Visit: Payer: PPO | Attending: Neurology | Admitting: Physical Therapy

## 2020-11-01 ENCOUNTER — Encounter: Payer: Self-pay | Admitting: Physical Therapy

## 2020-11-01 ENCOUNTER — Other Ambulatory Visit: Payer: Self-pay

## 2020-11-01 DIAGNOSIS — R42 Dizziness and giddiness: Secondary | ICD-10-CM

## 2020-11-01 DIAGNOSIS — R296 Repeated falls: Secondary | ICD-10-CM | POA: Insufficient documentation

## 2020-11-01 DIAGNOSIS — R262 Difficulty in walking, not elsewhere classified: Secondary | ICD-10-CM | POA: Diagnosis not present

## 2020-11-01 DIAGNOSIS — H8112 Benign paroxysmal vertigo, left ear: Secondary | ICD-10-CM

## 2020-11-01 DIAGNOSIS — R2681 Unsteadiness on feet: Secondary | ICD-10-CM | POA: Insufficient documentation

## 2020-11-01 DIAGNOSIS — H8111 Benign paroxysmal vertigo, right ear: Secondary | ICD-10-CM | POA: Diagnosis not present

## 2020-11-01 NOTE — Therapy (Signed)
[facility] [address] [place], [place], [address] Phone: [phone]   Fax:  [phone]  Physical Therapy Evaluation  Patient Details  Name: [name] MRN: [id number] Date of Birth: Aug 14, 1933 Referring Provider (PT): Cameron Sprang, MD   Encounter Date: 11/01/2020   PT End of Session - 11/01/20 1111     Visit Number 1    Number of Visits 9    Date for PT Re-Evaluation 12/01/20    Authorization Type [employer]; $30 copay    Progress Note Due on Visit 10    PT Start Time 0850    PT Stop Time 0936    PT Time Calculation (min) 46 min    Activity Tolerance Patient tolerated treatment well    Behavior During Therapy [facility] for tasks assessed/performed             Past Medical History:  Diagnosis Date   Acute blood loss anemia    Acute upper respiratory infections of unspecified site 11/04/2012   AKI (acute kidney injury)    Benign prostatic hyperplasia    CAD (coronary artery disease)    s/p cypher DES to pLAD 6/08; normal LVF;  ETT-Myoview 2009: no ischemia    Clavicle fracture 08/10/2019   Closed displaced fracture of phalanx of left thumb, sequela 08/10/2019   Coronary atherosclerosis of native coronary artery 03/08/2008   Eosinophilia    Essential hypertension    Fracture of multiple ribs with pain 08/10/2019   History of multiple falls    History of SAH (subarachnoid hemorrhage) 07/14/2019   Hyperlipidemia type IIB / III 03/08/2008   Major neurocognitive disorder due to possible Alzheimer's disease, without behavioral disturbance 02/29/2020   MI (myocardial infarction)    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    Subjective Assessment - 11/01/20 0857     Subjective Had a major fall in August - turned quickly with rollator and it threw him forwards, hit his face on a dresser.  Went to ED after the fall - imaging performed without any acute abnormalities. Dizziness came back after the fall, resolved and then came back 2 weeks later.  Pt thinks the crystals are out of place again.    Patient is accompained by: Family member    Pertinent History Anemia, acute URI, AKI, CAD, clavical fx, closed displaced fracture of phalanx of L thumb, HTN, multiple rib fractures, multiple falls, SAH, HLD, major neurocognitive disorder due to possible Alzheimer's disease, MI, orthostatic hypotension, trigeminal neuralgia, type 2 DM    Limitations Standing;Walking    Patient Stated Goals "One and Done."    Currently in Pain? No/denies                [facility] PT Assessment - 11/01/20 0900  Assessment   Medical Diagnosis Dizziness and falls    Referring Provider (PT) Cameron Sprang, MD    Onset Date/Surgical Date 09/28/20    Prior Therapy yes for vertigo/falls      Precautions   Precautions Other (comment)    Precaution Comments Anemia, acute URI, AKI, CAD, clavical fx, closed displaced fracture of phalanx of L thumb, HTN, multiple rib fractures, multiple falls, SAH, HLD, major neurocognitive disorder due to possible Alzheimer's disease, MI, orthostatic hypotension, trigeminal neuralgia, type 2 DM      Restrictions   Weight Bearing Restrictions No      Balance Screen   Has the patient fallen in the past 6 months Yes    How many times? 3      [place] residence    Living Arrangements Spouse/significant other      Prior Function   Level of Independence Independent with basic ADLs;Independent with household mobility with device;Independent with community mobility with device;Independent with transfers;Requires assistive device for independence;Independent with gait      Observation/Other Assessments   Focus on Therapeutic Outcomes (FOTO)  DFS: 39; DPS: 55.6      Sensation   Light Touch Appears Intact   No changes     ROM / Strength   AROM / PROM / Strength Strength      Strength   Overall Strength Other (comment)   no changes since previous certification period     Ambulation/Gait   Ambulation/Gait Yes    Ambulation/Gait Assistance 6: Modified independent (Device/Increase time)    Ambulation Distance (Feet) 100 Feet    Assistive device 4-wheeled walker    Gait Pattern Step-through pattern;Trunk flexed    Ambulation Surface Level;Indoor    Gait Comments wife provides cues to slow gait velocity and cues to slow turns                    Vestibular Assessment - 11/01/20 0903       Vestibular Assessment   General Observation Ambulating with rollator; wife cues pt to slow down gait velocity.  Hit on L side this fall.      Symptom Behavior   Subjective history of current problem Denies changes in vision, denies changes in hearing or tinnitus, had HA after fall but those have resolved,    Type of Dizziness  Spinning;Imbalance    Frequency of Dizziness Intermittent    Duration of Dizziness A minute or two    Symptom Nature Motion provoked    Aggravating Factors Supine to sit;Sit to stand;Forward bending;Looking up to the ceiling;Lying supine    Relieving Factors Slow movements    Progression of Symptoms Better      Oculomotor Exam   Oculomotor Alignment Normal   Exophoria with cover-cross cover   Ocular ROM WFL    Spontaneous Absent    Gaze-induced  Absent    Smooth  Pursuits Saccades    Saccades Intact      Oculomotor Exam-Fixation Suppressed    Left Head Impulse positive    Right Head Impulse negative      Vestibulo-Ocular Reflex   VOR to Slow Head Movement Positive bilaterally    VOR Cancellation Normal      Positional Testing   Dix-Hallpike Dix-Hallpike Right;Dix-Hallpike Left    Horizontal Canal Testing Horizontal Canal Right;Horizontal Canal Left      Dix-Hallpike Right   Dix-Hallpike Right Duration 0    Dix-Hallpike Right Symptoms No nystagmus  Dix-Hallpike Left   Dix-Hallpike Left Duration 0    Dix-Hallpike Left Symptoms No nystagmus      Horizontal Canal Right   Horizontal Canal Right Duration 60 seconds    Horizontal Canal Right Symptoms Other (comment)   down beating and then changed to ageotrophic, first test more symptomatic on R     Horizontal Canal Left   Horizontal Canal Left Duration 60 seconds    Horizontal Canal Left Symptoms Ageotrophic   on second test, more symptomatic on the L               Objective measurements completed on examination: See above findings.                PT Education - 11/01/20 1111     Education Details clinical findings, why multiple therapy sessions may be needed, PT POC and goals    Person(s) Educated Patient;Spouse    Methods Explanation    Comprehension Verbalized understanding                 PT Long Term Goals - 11/01/20 1140       PT LONG TERM GOAL #1   Title Pt will demonstrate ability to perform vestibular/balance HEP with wife's supervision    Time 4    Period Weeks    Status New    Target Date 12/01/20      PT LONG TERM GOAL #2   Title Pt will report no dizziness with rolling/bed mobility and will demonstrate negative positional testing    Baseline apogeotrophic nystagmus with roll test    Time 4    Period Weeks    Status New    Target Date 12/01/20      PT LONG TERM GOAL #3   Title Pt will report no dizziness when performing turns  in standing, looking up or bending down to the ground    Time 4    Period Weeks    Status New    Target Date 12/01/20      PT LONG TERM GOAL #4   Title Pt will increase DFS to >/= 50 and increase DPS to >/= 60 on FOTO    Baseline DFS: 39, DPS: 55.6    Time 4    Period Weeks    Status New    Target Date 12/01/20      PT LONG TERM GOAL #5   Title Pt will perform TUG with rollator safely in </= 17 sec    Baseline TBA    Time 4    Period Weeks    Status New    Target Date 12/01/20                    Plan - 11/01/20 1112     Clinical Impression Statement Pt is an 87 year old male well known to this clinic for previous falls and vertigo, referred back to Neuro OPPT for evaluation of ongoing vertigo and imbalance after fall in August.  Pt's PMH is significant for the following: Anemia, acute URI, AKI, CAD, clavical fx, closed displaced fracture of phalanx of L thumb, HTN, multiple rib fractures, multiple falls, SAH, HLD, major neurocognitive disorder due to possible Alzheimer's disease, MI, orthostatic hypotension, trigeminal neuralgia, type 2 DM. The following deficits were noted during pt's exam: impaired oculomotor exam, impaired VOR gain with positive head impulse test to the L, neck muscle guarding, impaired standing balance, impaired dynamic gait, vertigo with a combination of apogeotrophic  nystagmus and downbeating nystagmus during roll test - unable to determine laterality due to different symptom response/intensity with each testing maneuver.  Differential diagnoses include: horizontal canal cupulolithiasis, central vertigo, and L vestibular hypofunction.  Pt is at high risk for falls and would benefit from skilled PT to address these impairments and functional limitations to maximize functional mobility independence and reduce falls risk.    Personal Factors and Comorbidities Comorbidity 3+;Past/Current Experience;Age    Comorbidities Anemia, acute URI, AKI, CAD, clavical fx,  closed displaced fracture of phalanx of L thumb, HTN, multiple rib fractures, multiple falls, SAH, HLD, major neurocognitive disorder due to possible Alzheimer's disease, MI, orthostatic hypotension, trigeminal neuralgia, type 2 DM    Examination-Activity Limitations Bend;Locomotion Level;Reach Overhead;Stand;Transfers    Examination-Participation Restrictions Community Activity    Stability/Clinical Decision Making Evolving/Moderate complexity    Clinical Decision Making Moderate    Rehab Potential Good    PT Frequency 2x / week    PT Duration 4 weeks    PT Treatment/Interventions ADLs/Self Care Home Management;Canalith Repostioning;DME Instruction;Gait training;Functional mobility training;Therapeutic activities;Therapeutic exercise;Balance training;Neuromuscular re-education;Patient/family education;Vestibular    PT Next Visit Plan Re-check rolling for apogeotrophic nystagmus - treat for cupulolithiasis if you can determine which side is less symptomatic.  Assess MSQ and TUG.  HEP    Consulted and Agree with Plan of Care Patient             Patient will benefit from skilled therapeutic intervention in order to improve the following deficits and impairments:  Decreased balance, Dizziness, Difficulty walking  Visit Diagnosis: BPPV (benign paroxysmal positional vertigo), left  BPPV (benign paroxysmal positional vertigo), right  Dizziness and giddiness  Difficulty in walking, not elsewhere classified  Unsteadiness on feet  Repeated falls     Problem List 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    Multiple trauma    Benign prostatic hyperplasia    Essential hypertension    Stage 3b  chronic kidney disease ([place])    Thrombocytopenia    History of multiple falls 07/14/2019   History of SAH (subarachnoid hemorrhage) 07/14/2019   Type 2 diabetes mellitus    Eosinophilia    Nocturnal leg cramps 11/23/2012   Pain in joint, shoulder region 11/23/2012   Hematoma 11/04/2012   Hyperlipidemia type IIB / III 03/08/2008   Orthostatic hypotension 03/08/2008   Coronary atherosclerosis of native coronary artery 03/08/2008   Rico Junker, PT, DPT 11/01/20    11:59 AM    [facility] [address] [place], [place], [address] Phone: [phone]   Fax:  [phone]  Name: KEELIN SHERIDAN MRN: [id number] Date of Birth: 07-14-1933

## 2020-11-07 ENCOUNTER — Telehealth: Payer: Self-pay | Admitting: Pharmacist

## 2020-11-07 NOTE — Progress Notes (Signed)
Chronic Care Management Pharmacy Note  11/11/2020 Name:  [name] MRN:  [id number] DOB:  [birth year]  Summary: Initial visit with PharmD.  Meds reviewed/updated.   No concerns with affordability.  He is due for regular lab workup.  Requests to do this at the same time he comes in for LFT's.    Recommendations/Changes made from today's visit: Recheck A1c and lipids  Plan: FU PharmD 6 months   Subjective: [name] is an 87 y.o. year old male who is a primary patient of Pickard, Cammie Mcgee, MD.  The CCM team was consulted for assistance with disease management and care coordination needs.    Engaged with patient by telephone for initial visit in response to provider referral for pharmacy case management and/or care coordination services.   Consent to Services:  The patient was given the following information about Chronic Care Management services today, agreed to services, and gave verbal consent: 1. CCM service includes personalized support from designated clinical staff supervised by the primary care provider, including individualized plan of care and coordination with other care providers 2. 24/7 contact phone numbers for assistance for urgent and routine care needs. 3. Service will only be billed when office clinical staff spend 20 minutes or more in a month to coordinate care. 4. Only one practitioner may furnish and bill the service in a calendar month. 5.The patient may stop CCM services at any time (effective at the end of the month) by phone call to the office staff. 6. The patient will be responsible for cost sharing (co-pay) of up to 20% of the service fee (after annual deductible is met). Patient agreed to services and consent obtained.  Patient Care Team: [name], MD as PCP - General (Family Medicine) Sherren Mocha, MD as PCP - Cardiology (Cardiology) Audery Amel Sharma Covert, [facility] (Inactive) as Pharmacist (Pharmacist) [name], MD (Family  Medicine) Cameron Sprang, MD as Consulting Physician (Neurology) Edythe Clarity, [facility] as Pharmacist (Pharmacist) Recent office visits:  10/18/20 Dr. Dennard Schaumann For fever, illness. STARTED Molnupiravir 800 mg 2 times daily.  08/22/20 Dr. Dennard Schaumann For skin discoloration STARTED Terbinafine 250 mg daily. STOPPED Aspirin, Dicolfenac, Fluocinolone, Ketoconazole.  07/08/20 Dr. Dennard Schaumann For itching of ear. STARTED Fluocinolone Acetonide 9.47% 1 application apply daily PRN and Ketoconazole 2% 1 application 2 times weekly. STOPPED Atorvastatin.  06/30/20 Eulogio Bear, NP. For ear pain. STARTED Augmentin 875-125 mg 1 tablet 2 times daily. COMPLETED Doxycycline.    Recent consult visits:  09/09/20 Chase Picket, MD. For follow-up. Per note: Increase Rivastigmine 1.38m: Take 1 capsule in AM, 2 capsules in PM for 2 weeks, then increase to 2 capsules twice a day. Send me an update, if no issues, a new prescription for 371mcapsule will be sent.   [facility] St[facility] SaverMD. (8 Hours) For nausea and vomiting. STARTED Meclizine 12.5 mg 3 times daily PRN and Ondansetron 8 mg every 8 hours PRN.  [facility] Pl[facility]ondel JumboMD. For fall (2 hours) No medication changes.   [facility] Za[facility]ln[facility] MorrisonMD. For fall. (4 Hours) No medication changes.      Objective:  Lab Results  Component Value Date   CREATININE 1.80 (H) 09/28/2020   BUN 33 (H) 09/28/2020   GFRNONAA 36 (L) 09/28/2020   GFRAA 54 (L) 01/19/2020   NA 137 09/28/2020   K 3.6 09/28/2020   CALCIUM  9.0 09/28/2020   CO2 25 09/28/2020   GLUCOSE 155 (H) 09/28/2020    Lab Results  Component Value Date/Time   HGBA1C 6.5 (H) 07/15/2019 04:37 PM   HGBA1C 6.1 (H) 04/07/2018 08:27 AM   MICROALBUR 27.1 04/07/2018 08:27 AM    Last diabetic Eye exam: No results found for: HMDIABEYEEXA  Last diabetic  Foot exam: No results found for: HMDIABFOOTEX   Lab Results  Component Value Date   CHOL 186 07/29/2019   HDL 48 07/29/2019   LDLCALC 115 (H) 07/29/2019   TRIG 116 07/29/2019   CHOLHDL 3.9 07/29/2019    Hepatic Function Latest Ref Rng & Units 09/28/2020 09/26/2020 08/31/2020  Total Protein 6.5 - 8.1 g/dL 7.2 6.4 6.8  Albumin 3.5 - 5.0 g/dL 3.9 - 3.8  AST 15 - 41 U/L 44(H) 27 37  ALT 0 - 44 U/L _0 Alk Phosphatase 38 - 126 U/L 97 - 100  Total Bilirubin 0.3 - 1.2 mg/dL 1.1 0.4 0.6  Bilirubin, Direct 0.00 - 0.40 mg/dL - - -    Lab Results  Component Value Date/Time   TSH 0.94 05/06/2017 02:45 PM   TSH 1.84 03/29/2016 08:05 AM    CBC Latest Ref Rng & Units 09/28/2020 08/31/2020 02/14/2020  WBC 4.0 - 10.5 K/uL 9.4 5.3 7.2  Hemoglobin 13.0 - 17.0 g/dL 12.7(L) 12.1(L) 12.7(L)  Hematocrit 39.0 - 52.0 % 39.1 37.0(L) 37.2(L)  Platelets 150 - 400 K/uL 186 182 218    No results found for: VD25OH  Clinical ASCVD: Yes  The ASCVD Risk score (Arnett DK, et al., 2019) failed to calculate for the following reasons:   The 2019 ASCVD risk score is only valid for ages 57 to 50   The patient has a prior MI or stroke diagnosis    Depression screen [facility] 2/9 03/10/2020 11/25/2019 09/23/2019  Decreased Interest 0 0 1  Down, Depressed, Hopeless 0 0 0  PHQ - 2 Score 0 0 1  Some recent data might be hidden     Social History   Tobacco Use  Smoking Status Never  Smokeless Tobacco Never   BP Readings from Last 3 Encounters:  10/18/20 128/76  09/28/20 (!) 141/63  09/24/20 (!) 192/78   Pulse Readings from Last 3 Encounters:  10/18/20 72  09/28/20 71  09/24/20 66   Wt Readings from Last 3 Encounters:  09/24/20 180 lb (81.6 kg)  09/09/20 176 lb 9.6 oz (80.1 kg)  08/22/20 176 lb (79.8 kg)   BMI Readings from Last 3 Encounters:  09/24/20 29.05 kg/m  09/09/20 27.25 kg/m  08/22/20 27.16 kg/m    Assessment/Interventions: Review of patient past medical history, allergies, medications,  health status, including review of consultants reports, laboratory and other test data, was performed as part of comprehensive evaluation and provision of chronic care management services.   SDOH:  (Social Determinants of Health) assessments and interventions performed: Yes  Financial Resource Strain: Low Risk    Difficulty of Paying Living Expenses: Not very hard    SDOH Screenings   Alcohol Screen: Not on file  Depression (PHQ2-9): Low Risk    PHQ-2 Score: 0  Financial Resource Strain: Low Risk    Difficulty of Paying Living Expenses: Not very hard  Food Insecurity: Not on file  Housing: Not on file  Physical Activity: Not on file  Social Connections: Not on file  Stress: Not on file  Tobacco Use: Low Risk    Smoking Tobacco Use: Never   Smokeless  Tobacco Use: Never  Transportation Needs: Not on file    [place]  Allergies  Allergen Reactions   Gabapentin Other (See Comments)    Visual changes and confusion- "I went blind while I was driving"    Medications Reviewed Today     Reviewed by Edythe Clarity, [facility] (Pharmacist) on 11/11/20 at [place] List Status: <None>   Medication Order Taking? Sig Documenting Provider Last Dose Status Informant  albuterol (PROAIR HFA) 108 (90 Base) MCG/ACT inhaler [id number] Yes INHALE 2 PUFFS INTO THE LUNGS EVERY 6 HOURS AS NEEDED FOR WHEEZING OR SHORTNESS OF BREATH  Patient taking differently: Inhale 2 puffs into the lungs every 6 (six) hours as needed for wheezing or shortness of breath.   Cathlyn Parsons, PA-C Taking Active   amLODipine (NORVASC) 5 MG tablet [id number] Yes Take 1 tablet (5 mg total) by mouth daily. Sherren Mocha, MD Taking Active Spouse/Significant Other           Med Note Duffy Bruce, Para [place] Dec 24, 2019  3:29 PM)    Apoaequorin [facility] EXTRA STRENGTH PO) [id number] Yes Take 1 tablet by mouth daily. [provider] Taking Active Spouse/Significant Other  atorvastatin (LIPITOR) 20 MG tablet  [id number] Yes Take 1 tablet (20 mg total) by mouth daily. [name], MD Taking Active   BREO ELLIPTA 200-25 MCG/INH AEPB [id number] Yes INHALE 1 PUFF BY MOUTH EVERY DAY [name], MD Taking Active   Calcium Carb-Cholecalciferol (CALCIUM 600+D3 PO) [id number] Yes Take 1 tablet by mouth 2 (two) times daily. [provider] Taking Active Spouse/Significant Other  doxazosin (CARDURA) 2 MG tablet [id number] Yes TAKE 1 TABLET BY MOUTH EVERY DAY  Patient taking differently: Take 2 mg by mouth daily.   [name], MD Taking Active   escitalopram (LEXAPRO) 10 MG tablet [id number] Yes Take 1 tablet (10 mg total) by mouth daily. [name], MD Taking Active   ezetimibe (ZETIA) 10 MG tablet [id number] Yes TAKE 1 TABLET BY MOUTH EVERY DAY  Patient taking differently: Take 10 mg by mouth at bedtime.   Sherren Mocha, MD Taking Active   loratadine (CLARITIN) 10 MG tablet [id number] Yes Take 10 mg by mouth daily as needed for allergies or rhinitis.  [provider] Taking Active Spouse/Significant Other  meclizine (ANTIVERT) 12.5 MG tablet [id number] Yes Take 1 tablet (12.5 mg total) by mouth 3 (three) times daily as needed for dizziness. Lajean Saver, MD Taking Active   Multiple Vitamin (MULTIVITAMIN WITH MINERALS) TABS tablet [id number] Yes Take 1 tablet by mouth daily. [provider] Taking Active Spouse/Significant Other  ondansetron (ZOFRAN ODT) 8 MG disintegrating tablet [id number] Yes Take 1 tablet (8 mg total) by mouth every 8 (eight) hours as needed for nausea or vomiting. Lajean Saver, MD Taking Active   rivastigmine (EXELON) 1.5 MG capsule [id number] No Take 1 capsule daily for 1 week, then increase to 1 capsule twice a day  Patient not taking: No sig reported   Cameron Sprang, MD Not Taking Active   terbinafine (LAMISIL) 250 MG tablet [id number] Yes Take 1 tablet (250 mg total) by mouth daily. [name], MD Taking Active              Patient Active Problem List   Diagnosis Date Noted   Dizziness 03/08/2020   Major neurocognitive disorder due to possible Alzheimer's disease, without behavioral disturbance 02/29/2020   CAD (coronary artery disease)    MI (myocardial  infarction)    Trigeminal neuralgia    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    Multiple trauma    Benign prostatic hyperplasia    Essential hypertension    Stage 3b chronic kidney disease (HCC)    Thrombocytopenia    History of multiple falls 07/14/2019   History of SAH (subarachnoid hemorrhage) 07/14/2019   Type 2 diabetes mellitus    Eosinophilia    Nocturnal leg cramps 11/23/2012   Pain in joint, shoulder region 11/23/2012   Hematoma 11/04/2012   Hyperlipidemia type IIB / III 03/08/2008   Orthostatic hypotension 03/08/2008   Coronary atherosclerosis of native coronary artery 03/08/2008    Immunization History  Administered Date(s) Administered   Influenza Split 10/06/2012   Influenza, High Dose Seasonal PF 12/02/2016, 10/26/2018, 10/27/2018, 10/23/2019   Influenza,inj,Quad PF,6+ Mos 11/12/2013, 11/11/2014, 10/20/2015, 11/12/2017   Moderna Sars-Covid-2 Vaccination 03/04/2019, 04/03/2019   Pneumococcal Conjugate-13 03/17/2013   Pneumococcal Polysaccharide-23 02/05/2001   Td 03/01/2009   Zoster, Live 02/25/2008    Conditions to be addressed/monitored:  CAD, Type II DM, HLD, HTN  Care Plan : General Pharmacy (Adult)  Updates made by Edythe Clarity, RPH since 11/11/2020 12:00 AM     Problem: CAD, Type II DM, HLD, HTN   Priority: High  Onset Date: 11/10/2020  Note:   Current Barriers:  Borderline A1c and LDL  Pharmacist Clinical Goal(s):  Patient will achieve improvement in A1c and lipids as evidenced by labs through collaboration with PharmD and provider.   Interventions: 1:1 collaboration with [name], MD regarding development and update of comprehensive plan of care as evidenced by provider  attestation and co-signature Inter-disciplinary care team collaboration (see longitudinal plan of care) Comprehensive medication review performed; medication list updated in electronic medical record  Hypertension (BP goal <140/90) -Controlled -Current treatment: Amlodipine 56m daily AM Doxazosin 257mdaily AM -Medications previously tried: benazepril  -Current home readings: "normal" no BP logs at home  -Current exercise habits: some walking and weigh bearing exercise -Reports hypotensive/hypertensive symptoms - dizziness, however he is being treated for Vertigo at the moment doing PT for Epley maneuver and it has been beneficial so far -Educated on BP goals and benefits of medications for prevention of heart attack, stroke and kidney damage; Exercise goal of 150 minutes per week; Symptoms of hypotension and importance of maintaining adequate hydration; -Counseled to monitor BP at home periodically, document, and provide log at future appointments -Recommended to continue current medication  Hyperlipidemia/CAD: (LDL goal < 100) -Not ideally controlled -Current treatment: Atorvastatin 2020maily PM Zetia 75m34mily -Medications previously tried: simvastatin  -Educated on Cholesterol goals;  Benefits of statin for ASCVD risk reduction; Importance of limiting foods high in cholesterol; -Due for recheck on lipids, coming in next week to check LFTs due to therapy with terbinafine for toenail fungus.  Patient would like to add follow up lab work since it has been < 1 year from previous full workup. -Recommended to continue current medication Recheck lipids - determine next steps  Diabetes (A1c goal <6.5%) -Controlled -Current medications: none -Medications previously tried: none noted  -Current home glucose readings fasting glucose: not checking post prandial glucose: not checking -Denies hypoglycemic/hyperglycemic symptoms -Most recent A1c was 6.5 - on no medications at this  time -Educated on A1c and blood sugar goals; Limiting carbohydrates and excess sugars -Counseled to check feet daily and get yearly eye exams -Recheck A1c at upcoming labs to determine if therapy is needed  Patient Goals/Self-Care Activities  Patient will:  - take medications as prescribed focus on medication adherence by pill box check blood pressure periodically, document, and provide at future appointments  Follow Up Plan: The care management team will reach out to the patient again over the next 180 days.       Goal: Patient-Specific Goal        Medication Assistance: None required.  Patient affirms current coverage meets needs.  Compliance/Adherence/Medication fill history: Care Gaps: Foot exam Eye Exam  Star-Rating Drugs: Atorvastatin 75m 08/22/20 90ds  Patient's preferred pharmacy is:  [id number] [place], [place] - [id number]: [phone]: [phone] pill box? Yes Pt endorses 100% compliance  We discussed: Benefits of medication synchronization, packaging and delivery as well as enhanced pharmacist oversight with [employer]. Patient decided to: Continue current medication management strategy  Care Plan and Follow Up Patient Decision:  Patient agrees to Care Plan and Follow-up.  Plan: The care management team will reach out to the patient again over the next 180 days.  ChBeverly MilchPharmD Clinical Pharmacist BrNorth Buena [phone]

## 2020-11-07 NOTE — Progress Notes (Addendum)
Chronic Care Management Pharmacy Assistant   Name: [name]  MRN: [id number] DOB: 11/21/1933  [name] is an 87 y.o. year old male who presents for his initial CCM visit with the clinical pharmacist.  Reason for Encounter: Chart Prep    Conditions to be addressed/monitored: HTN, CAD, Type 2 DM, HLD.  Primary concerns for visit include: HTN, Type 2 DM.  Recent office visits:  10/18/20 Dr. Dennard Schaumann For fever, illness. STARTED Molnupiravir 800 mg 2 times daily.  08/22/20 Dr. Dennard Schaumann For skin discoloration STARTED Terbinafine 250 mg daily. STOPPED Aspirin, Dicolfenac, Fluocinolone, Ketoconazole.  07/08/20 Dr. Dennard Schaumann For itching of ear. STARTED Fluocinolone Acetonide 9.67% 1 application apply daily PRN and Ketoconazole 2% 1 application 2 times weekly. STOPPED Atorvastatin.  06/30/20 Eulogio Bear, NP. For ear pain. STARTED Augmentin 875-125 mg 1 tablet 2 times daily. COMPLETED Doxycycline.   Recent consult visits:  09/09/20 Chase Picket, MD. For follow-up. Per note: Increase Rivastigmine 1.5mg : Take 1 capsule in AM, 2 capsules in PM for 2 weeks, then increase to 2 capsules twice a day. Send me an update, if no issues, a new prescription for 3mg  capsule will be sent.  Hospital visits: 09/28/20 [facility] Lajean Saver, MD. (8 Hours) For nausea and vomiting. STARTED Meclizine 12.5 mg 3 times daily PRN and Ondansetron 8 mg every 8 hours PRN.  09/24/20 [facility] Rondel Jumbo, MD. For fall (2 hours) No medication changes.   08/31/20  [facility] Elnora Morrison, MD. For fall. (4 Hours) No medication changes.   Medication History: Atorvastatin 20 mg 08/22/20 90 DS  Medications: Outpatient Encounter Medications as of 11/07/2020  Medication Sig   albuterol (PROAIR HFA) 108 (90 Base) MCG/ACT inhaler INHALE 2 PUFFS INTO THE LUNGS EVERY 6 HOURS AS NEEDED FOR WHEEZING  OR SHORTNESS OF BREATH (Patient taking differently: Inhale 2 puffs into the lungs every 6 (six) hours as needed for wheezing or shortness of breath.)   amLODipine (NORVASC) 5 MG tablet Take 1 tablet (5 mg total) by mouth daily.   Apoaequorin (PREVAGEN EXTRA STRENGTH PO) Take 1 tablet by mouth daily.   atorvastatin (LIPITOR) 20 MG tablet Take 1 tablet (20 mg total) by mouth daily.   BREO ELLIPTA 200-25 MCG/INH AEPB INHALE 1 PUFF BY MOUTH EVERY DAY   Calcium Carb-Cholecalciferol (CALCIUM 600+D3 PO) Take 1 tablet by mouth 2 (two) times daily.   doxazosin (CARDURA) 2 MG tablet TAKE 1 TABLET BY MOUTH EVERY DAY (Patient taking differently: Take 2 mg by mouth daily.)   escitalopram (LEXAPRO) 10 MG tablet Take 1 tablet (10 mg total) by mouth daily.   ezetimibe (ZETIA) 10 MG tablet TAKE 1 TABLET BY MOUTH EVERY DAY (Patient taking differently: Take 10 mg by mouth at bedtime.)   loratadine (CLARITIN) 10 MG tablet Take 10 mg by mouth daily as needed for allergies or rhinitis.    meclizine (ANTIVERT) 12.5 MG tablet Take 1 tablet (12.5 mg total) by mouth 3 (three) times daily as needed for dizziness.   Multiple Vitamin (MULTIVITAMIN WITH MINERALS) TABS tablet Take 1 tablet by mouth daily.   ondansetron (ZOFRAN ODT) 8 MG disintegrating tablet Take 1 tablet (8 mg total) by mouth every 8 (eight) hours as needed for nausea or vomiting.   rivastigmine (EXELON) 1.5 MG capsule Take 1 capsule daily for 1 week, then increase to 1 capsule twice a day (Patient not taking: Reported on 11/01/2020)   terbinafine (  LAMISIL) 250 MG tablet Take 1 tablet (250 mg total) by mouth daily.   No facility-administered encounter medications on file as of 11/07/2020.   Have you seen any other providers since your last visit? Patients wife stated no.  Any changes in your medications or health? Patients wife stated no.   Any side effects from any medications? Patients wife stated no.   Do you have an symptoms or problems not managed by  your medications? Patients wife stated no.   Any concerns about your health right now? Patients wife stated she is concerned about her husbands alzheimer change in medication.   Has your provider asked that you check blood pressure, blood sugar, or follow special diet at home? Patients wife stated no.  Do you get any type of exercise on a regular basis? Patients wife stated he does some walking and weight lifting.   Can you think of a goal you would like to reach for your health? Patients wife stated for him to improve in his body strength and tone and to resolve his dizziness.   Do you have any problems getting your medications? Patients wife stated no.  Is there anything that you would like to discuss during the appointment? Patients wife stated no.   Please bring medications and supplements to appointment, patients wife reminded of her husbands OTP appointment on 11/10/20 at 330 pm.   [place], [place] Pharmacist Assistant [phone]

## 2020-11-08 ENCOUNTER — Ambulatory Visit: Payer: PPO

## 2020-11-10 ENCOUNTER — Ambulatory Visit (INDEPENDENT_AMBULATORY_CARE_PROVIDER_SITE_OTHER): Payer: PPO | Admitting: Pharmacist

## 2020-11-10 DIAGNOSIS — R42 Dizziness and giddiness: Secondary | ICD-10-CM | POA: Diagnosis not present

## 2020-11-10 DIAGNOSIS — I10 Essential (primary) hypertension: Secondary | ICD-10-CM

## 2020-11-10 DIAGNOSIS — E782 Mixed hyperlipidemia: Secondary | ICD-10-CM

## 2020-11-11 ENCOUNTER — Other Ambulatory Visit: Payer: Self-pay | Admitting: *Deleted

## 2020-11-11 DIAGNOSIS — I10 Essential (primary) hypertension: Secondary | ICD-10-CM

## 2020-11-11 DIAGNOSIS — N1832 Chronic kidney disease, stage 3b: Secondary | ICD-10-CM

## 2020-11-11 DIAGNOSIS — E782 Mixed hyperlipidemia: Secondary | ICD-10-CM

## 2020-11-11 DIAGNOSIS — E119 Type 2 diabetes mellitus without complications: Secondary | ICD-10-CM

## 2020-11-11 DIAGNOSIS — Z136 Encounter for screening for cardiovascular disorders: Secondary | ICD-10-CM

## 2020-11-11 DIAGNOSIS — Z13220 Encounter for screening for lipoid disorders: Secondary | ICD-10-CM

## 2020-11-11 NOTE — Patient Instructions (Addendum)
Visit Information   Goals Addressed             This Visit's Progress    Track and Manage My Blood Pressure-Hypertension       Timeframe:  Long-Range Goal Priority:  High Start Date:  11/10/20                           Expected End Date:    05/11/21                   Follow Up Date 02/10/21    - check blood pressure weekly - choose a place to take my blood pressure (home, clinic or office, retail store) - write blood pressure results in a log or diary    Why is this important?   You won't feel high blood pressure, but it can still hurt your blood vessels.  High blood pressure can cause heart or kidney problems. It can also cause a stroke.  Making lifestyle changes like losing a little weight or eating less salt will help.  Checking your blood pressure at home and at different times of the day can help to control blood pressure.  If the doctor prescribes medicine remember to take it the way the doctor ordered.  Call the office if you cannot afford the medicine or if there are questions about it.     Notes:        Patient Care Plan: General Pharmacy (Adult)     Problem Identified: CAD, Type II DM, HLD, HTN   Priority: High  Onset Date: 11/10/2020  Note:   Current Barriers:  Borderline A1c and LDL  Pharmacist Clinical Goal(s):  Patient will achieve improvement in A1c and lipids as evidenced by labs through collaboration with PharmD and provider.   Interventions: 1:1 collaboration with [name], MD regarding development and update of comprehensive plan of care as evidenced by provider attestation and co-signature Inter-disciplinary care team collaboration (see longitudinal plan of care) Comprehensive medication review performed; medication list updated in electronic medical record  Hypertension (BP goal <140/90) -Controlled -Current treatment: Amlodipine 5mg  daily AM Doxazosin 2mg  daily AM -Medications previously tried: benazepril  -Current home readings:  "normal" no BP logs at home  -Current exercise habits: some walking and weigh bearing exercise -Reports hypotensive/hypertensive symptoms - dizziness, however he is being treated for Vertigo at the moment doing PT for Epley maneuver and it has been beneficial so far -Educated on BP goals and benefits of medications for prevention of heart attack, stroke and kidney damage; Exercise goal of 150 minutes per week; Symptoms of hypotension and importance of maintaining adequate hydration; -Counseled to monitor BP at home periodically, document, and provide log at future appointments -Recommended to continue current medication  Hyperlipidemia/CAD: (LDL goal < 100) -Not ideally controlled -Current treatment: Atorvastatin 20mg  daily PM Zetia 10mg  daily -Medications previously tried: simvastatin  -Educated on Cholesterol goals;  Benefits of statin for ASCVD risk reduction; Importance of limiting foods high in cholesterol; -Due for recheck on lipids, coming in next week to check LFTs due to therapy with terbinafine for toenail fungus.  Patient would like to add follow up lab work since it has been < 1 year from previous full workup. -Recommended to continue current medication Recheck lipids - determine next steps  Diabetes (A1c goal <6.5%) -Controlled -Current medications: none -Medications previously tried: none noted  -Current home glucose readings fasting glucose: not checking post prandial glucose: not  checking -Denies hypoglycemic/hyperglycemic symptoms -Most recent A1c was 6.5 - on no medications at this time -Educated on A1c and blood sugar goals; Limiting carbohydrates and excess sugars -Counseled to check feet daily and get yearly eye exams -Recheck A1c at upcoming labs to determine if therapy is needed  Patient Goals/Self-Care Activities Patient will:  - take medications as prescribed focus on medication adherence by pill box check blood pressure periodically, document, and  provide at future appointments  Follow Up Plan: The care management team will reach out to the patient again over the next 180 days.       Goal: Patient-Specific Goal       Mr. Lottman was given information about Chronic Care Management services today including:  CCM service includes personalized support from designated clinical staff supervised by his physician, including individualized plan of care and coordination with other care providers 24/7 contact phone numbers for assistance for urgent and routine care needs. Standard insurance, coinsurance, copays and deductibles apply for chronic care management only during months in which we provide at least 20 minutes of these services. Most insurances cover these services at 100%, however patients may be responsible for any copay, coinsurance and/or deductible if applicable. This service may help you avoid the need for more expensive face-to-face services. Only one practitioner may furnish and bill the service in a calendar month. The patient may stop CCM services at any time (effective at the end of the month) by phone call to the office staff.  Patient agreed to services and verbal consent obtained.   The patient verbalized understanding of instructions, educational materials, and care plan provided today and agreed to receive a mailed copy of patient instructions, educational materials, and care plan.  Telephone follow up appointment with pharmacy team member scheduled for: 6 months  Edythe Clarity, [place]

## 2020-11-16 ENCOUNTER — Other Ambulatory Visit: Payer: Self-pay

## 2020-11-16 ENCOUNTER — Ambulatory Visit: Payer: PPO | Attending: Neurology

## 2020-11-16 DIAGNOSIS — R262 Difficulty in walking, not elsewhere classified: Secondary | ICD-10-CM | POA: Diagnosis not present

## 2020-11-16 DIAGNOSIS — R296 Repeated falls: Secondary | ICD-10-CM | POA: Diagnosis not present

## 2020-11-16 DIAGNOSIS — R2681 Unsteadiness on feet: Secondary | ICD-10-CM | POA: Diagnosis not present

## 2020-11-16 DIAGNOSIS — H8111 Benign paroxysmal vertigo, right ear: Secondary | ICD-10-CM | POA: Diagnosis not present

## 2020-11-16 DIAGNOSIS — R42 Dizziness and giddiness: Secondary | ICD-10-CM | POA: Diagnosis not present

## 2020-11-16 DIAGNOSIS — H8112 Benign paroxysmal vertigo, left ear: Secondary | ICD-10-CM | POA: Diagnosis not present

## 2020-11-16 NOTE — Therapy (Signed)
[facility] [address] [place], [place], [address] Phone: [phone]   Fax:  [phone]  Physical Therapy Treatment  Patient Details  Name: [name] MRN: [id number] Date of Birth: September 26, 1933 Referring Provider (PT): Cameron Sprang, MD   Encounter Date: 11/16/2020   PT End of Session - 11/16/20 0848     Visit Number 2    Number of Visits 9    Date for PT Re-Evaluation 12/01/20    Authorization Type [employer]; $30 copay    Progress Note Due on Visit 10    PT Start Time 0848    PT Stop Time 0925    PT Time Calculation (min) 37 min    Activity Tolerance Patient tolerated treatment well    Behavior During Therapy [facility] for tasks assessed/performed             Past Medical History:  Diagnosis Date   Acute blood loss anemia    Acute upper respiratory infections of unspecified site 11/04/2012   AKI (acute kidney injury)    Benign prostatic hyperplasia    CAD (coronary artery disease)    s/p cypher DES to pLAD 6/08; normal LVF;  ETT-Myoview 2009: no ischemia    Clavicle fracture 08/10/2019   Closed displaced fracture of phalanx of left thumb, sequela 08/10/2019   Coronary atherosclerosis of native coronary artery 03/08/2008   Eosinophilia    Essential hypertension    Fracture of multiple ribs with pain 08/10/2019   History of multiple falls    History of SAH (subarachnoid hemorrhage) 07/14/2019   Hyperlipidemia type IIB / III 03/08/2008   Major neurocognitive disorder due to possible Alzheimer's disease, without behavioral disturbance 02/29/2020   MI (myocardial infarction)    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   Subjective Assessment - 11/16/20 0850     Subjective Patient reports the dizziness has been minimal since the last visit. Patient denies falls, but did have one stumble but caught himself onto the bed. Reports that he is having some pain on the eyes, reports feels like it is due to a stye.    Patient is accompained by: Family member    Pertinent History Anemia, acute URI, AKI, CAD, clavical fx, closed displaced fracture of phalanx of L thumb, HTN, multiple rib fractures, multiple falls, SAH, HLD, major neurocognitive disorder due to possible Alzheimer's disease, MI, orthostatic hypotension, trigeminal neuralgia, type 2 DM    Limitations Standing;Walking    Patient Stated Goals "One and Done."    Currently in Pain? No/denies               Vestibular Assessment - 11/16/20 0001       Positional Testing   Dix-Hallpike Dix-Hallpike Right;Dix-Hallpike Left    Horizontal Canal  Testing Horizontal Canal Right;Horizontal Canal Left      Dix-Hallpike Right   Dix-Hallpike Right Duration 0    Dix-Hallpike Right Symptoms No nystagmus      Dix-Hallpike Left   Dix-Hallpike Left Duration 0    Dix-Hallpike Left Symptoms No nystagmus      Horizontal Canal Right   Horizontal Canal Right Duration 60 seconds    Horizontal Canal Right Symptoms Other (comment)   continue to vary between geotrophic/ageotrophic nystagmus with assesment. unable to determine more affected side due to varying nature.     Horizontal Canal Left   Horizontal Canal Left Duration 60 seconds    Horizontal Canal Left Symptoms Ageotrophic   unable to determine more  affected side.     Positional Sensitivities   Sit to Supine Lightheadedness    Supine to Left Side No dizziness   mild nausea   Supine to Right Side No dizziness    Supine to Sitting Lightheadedness    Right Hallpike No dizziness    Up from Right Hallpike No dizziness    Up from Left Hallpike No dizziness    Nose to Right Knee No dizziness    Right Knee to Sitting No dizziness    Nose to Left Knee No dizziness    Left Knee to Sitting No dizziness    Head Turning x 5 No dizziness    Head Nodding x 5 Lightheadedness    Pivot Right in Standing No dizziness    Pivot Left in Standing Lightheadedness    Rolling Right Mild dizziness    Rolling Left Mild dizziness            Rolling    With pillow under head, start on back. Roll slowly to right. Hold position until symptoms subside. Roll slowly onto left side. Hold position until symptoms subside. Repeat sequence 5 times per session. Do 1 sessions per day.   [facility] Adult PT Treatment/Exercise - 11/16/20 0001       Ambulation/Gait   Ambulation/Gait Yes    Ambulation/Gait Assistance 6: Modified independent (Device/Increase time)    Ambulation Distance (Feet) 115 Feet    Assistive device None    Gait Pattern Step-through pattern;Trunk flexed    Ambulation Surface Level;Indoor    Gait Comments cues for heel toe pattern and gait speed to avoid shuffled steps and promote improved safety with ambulation.      Standardized Balance Assessment   Standardized Balance Assessment Timed Up and Go Test      Timed Up and Go Test   TUG Normal TUG    Normal TUG (seconds) 10.66   with Rollator; 10.72 secs without AD.            Vestibular Treatment/Exercise - 11/16/20 0001       Vestibular Treatment/Exercise   Vestibular Treatment Provided Habituation    Habituation Exercises Horizontal Roll      Horizontal Roll   Number of Reps  5    Symptom Description  mild symptoms. HEP provided.                PT Long Term Goals -  11/01/20 1140       PT LONG TERM GOAL #1   Title Pt will demonstrate ability to perform vestibular/balance HEP with wife's supervision    Time 4    Period Weeks    Status New    Target Date 12/01/20      PT LONG TERM GOAL #2   Title Pt will  report no dizziness with rolling/bed mobility and will demonstrate negative positional testing    Baseline apogeotrophic nystagmus with roll test    Time 4    Period Weeks    Status New    Target Date 12/01/20      PT LONG TERM GOAL #3   Title Pt will report no dizziness when performing turns in standing, looking up or bending down to the ground    Time 4    Period Weeks    Status New    Target Date 12/01/20      PT LONG TERM GOAL #4   Title Pt will increase DFS to >/= 50 and increase DPS to >/= 60 on FOTO    Baseline DFS: 39, DPS: 55.6    Time 4    Period Weeks    Status New    Target Date 12/01/20      PT LONG TERM GOAL #5   Title Pt will perform TUG with rollator safely in </= 17 sec    Baseline TBA    Time 4    Period Weeks    Status New    Target Date 12/01/20                   Plan - 11/16/20 5681     Clinical Impression Statement Reassessed BPPV, continue to persent with varying presentation of nystagmus including geotrophic or ageotrophic with assesment and unable to determine laterality therefore no BPPV treatment today. Initiated rolling for habituation with patient tolerating well. Completed TUG with patient time of 10.66 seconds with AD. Will continue to assess and treat as indicated.    Personal Factors and Comorbidities Comorbidity 3+;Past/Current Experience;Age    Comorbidities Anemia, acute URI, AKI, CAD, clavical fx, closed displaced fracture of phalanx of L thumb, HTN, multiple rib fractures, multiple falls, SAH, HLD, major neurocognitive disorder due to possible Alzheimer's disease, MI, orthostatic hypotension, trigeminal neuralgia, type 2 DM    Examination-Activity Limitations Bend;Locomotion Level;Reach  Overhead;Stand;Transfers    Examination-Participation Restrictions Community Activity    Stability/Clinical Decision Making Evolving/Moderate complexity    Rehab Potential Good    PT Frequency 2x / week    PT Duration 4 weeks    PT Treatment/Interventions ADLs/Self Care Home Management;Canalith Repostioning;DME Instruction;Gait training;Functional mobility training;Therapeutic activities;Therapeutic exercise;Balance training;Neuromuscular re-education;Patient/family education;Vestibular    PT Next Visit Plan Re-check rolling for apogeotrophic nystagmus - treat for cupulolithiasis if you can determine which side is less symptomatic.  HEP VOR and balance additions.    Consulted and Agree with Plan of Care Patient             Patient will benefit from skilled therapeutic intervention in order to improve the following deficits and impairments:  Decreased balance, Dizziness, Difficulty walking  Visit Diagnosis: BPPV (benign paroxysmal positional vertigo), left  BPPV (benign paroxysmal positional vertigo), right  Dizziness and giddiness  Difficulty in walking, not elsewhere classified  Unsteadiness on feet  Repeated falls     Problem List 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 [place], [place], [address] Phone: [phone]   Fax:  [phone]  Name: [name] MRN: [id number] Date of Birth: 07/05/1933

## 2020-11-16 NOTE — Patient Instructions (Signed)
Rolling    With pillow under head, start on back. Roll slowly to right. Hold position until symptoms subside. Roll slowly onto left side. Hold position until symptoms subside. Repeat sequence 5 times per session. Do 1 sessions per day.  Copyright  VHI. All rights reserved.

## 2020-11-17 ENCOUNTER — Other Ambulatory Visit: Payer: Self-pay | Admitting: Cardiovascular Disease

## 2020-11-18 ENCOUNTER — Other Ambulatory Visit: Payer: Self-pay

## 2020-11-18 ENCOUNTER — Ambulatory Visit (INDEPENDENT_AMBULATORY_CARE_PROVIDER_SITE_OTHER): Payer: PPO | Admitting: Nurse Practitioner

## 2020-11-18 ENCOUNTER — Other Ambulatory Visit: Payer: PPO

## 2020-11-18 ENCOUNTER — Encounter: Payer: Self-pay | Admitting: Nurse Practitioner

## 2020-11-18 ENCOUNTER — Ambulatory Visit: Payer: PPO

## 2020-11-18 VITALS — BP 140/82 | HR 73 | Temp 98.0°F | Resp 12 | Ht 67.0 in | Wt 174.0 lb

## 2020-11-18 DIAGNOSIS — E782 Mixed hyperlipidemia: Secondary | ICD-10-CM | POA: Diagnosis not present

## 2020-11-18 DIAGNOSIS — E119 Type 2 diabetes mellitus without complications: Secondary | ICD-10-CM | POA: Diagnosis not present

## 2020-11-18 DIAGNOSIS — N1832 Chronic kidney disease, stage 3b: Secondary | ICD-10-CM | POA: Diagnosis not present

## 2020-11-18 DIAGNOSIS — I10 Essential (primary) hypertension: Secondary | ICD-10-CM | POA: Diagnosis not present

## 2020-11-18 DIAGNOSIS — Z13220 Encounter for screening for lipoid disorders: Secondary | ICD-10-CM | POA: Diagnosis not present

## 2020-11-18 DIAGNOSIS — H01014 Ulcerative blepharitis left upper eyelid: Secondary | ICD-10-CM | POA: Diagnosis not present

## 2020-11-18 DIAGNOSIS — H00014 Hordeolum externum left upper eyelid: Secondary | ICD-10-CM | POA: Diagnosis not present

## 2020-11-18 DIAGNOSIS — Z136 Encounter for screening for cardiovascular disorders: Secondary | ICD-10-CM | POA: Diagnosis not present

## 2020-11-18 MED ORDER — ERYTHROMYCIN 5 MG/GM OP OINT: 1.0000 "application " | TOPICAL_OINTMENT | Freq: Every day | OPHTHALMIC | 0 refills | Status: AC

## 2020-11-18 NOTE — Progress Notes (Signed)
Subjective:    Patient ID: [name], male    DOB: 09-28-1933, 87 y.o.   MRN: [id number]  HPI: [name] is a 87 y.o. male presenting for eye irritation.  Chief Complaint  Patient presents with   Eye Problem    History of styes on both eye. Recent stye on left eye. Both eyes burn, itch and hurt. Left eye is worse   EYE PAIN Duration:  days Involved eye: bilateral; left> right  Onset: gradual Severity: moderate Quality: discomfort Foreign body sensation:no Visual impairment: no Eye redness: yes Discharge: yes Crusting or matting of eyelids: no Swelling: yes Photophobia: no Itching: yes Tearing: yes Headache: no Floaters: no URI symptoms: no Contact lens use: no Close contacts with similar problems: no Eye trauma: no Aggravating factors: rubbing it  Alleviating factors: warm compress, cold pack Status: stable Treatments attempted: warm compress, cold pack  Allergies  Allergen Reactions   Gabapentin Other (See Comments)    Visual changes and confusion- "I went blind while I was driving"    Outpatient Encounter Medications as of 11/18/2020  Medication Sig   albuterol (PROAIR HFA) 108 (90 Base) MCG/ACT inhaler INHALE 2 PUFFS INTO THE LUNGS EVERY 6 HOURS AS NEEDED FOR WHEEZING OR SHORTNESS OF BREATH (Patient taking differently: Inhale 2 puffs into the lungs every 6 (six) hours as needed for wheezing or shortness of breath.)   amLODipine (NORVASC) 5 MG tablet Take 1 tablet (5 mg total) by mouth daily. Please make yearly appt with Dr. [name] for November 2022 for future refills. Thank you 1st attempt   Apoaequorin (PREVAGEN EXTRA STRENGTH PO) Take 1 tablet by mouth daily.   atorvastatin (LIPITOR) 20 MG tablet Take 1 tablet (20 mg total) by mouth daily.   BREO ELLIPTA 200-25 MCG/INH AEPB INHALE 1 PUFF BY MOUTH EVERY DAY   Calcium Carb-Cholecalciferol (CALCIUM 600+D3 PO) Take 1 tablet by mouth 2 (two) times daily.   doxazosin (CARDURA) 2 MG tablet TAKE  1 TABLET BY MOUTH EVERY DAY (Patient taking differently: Take 2 mg by mouth daily.)   erythromycin ophthalmic ointment Place 1 application into the left eye at bedtime for 7 days. Apply 1 cm ribbon into the left eye every night at bedtime for 7 days.   escitalopram (LEXAPRO) 10 MG tablet Take 1 tablet (10 mg total) by mouth daily.   ezetimibe (ZETIA) 10 MG tablet TAKE 1 TABLET BY MOUTH EVERY DAY (Patient taking differently: Take 10 mg by mouth at bedtime.)   loratadine (CLARITIN) 10 MG tablet Take 10 mg by mouth daily as needed for allergies or rhinitis.    meclizine (ANTIVERT) 12.5 MG tablet Take 1 tablet (12.5 mg total) by mouth 3 (three) times daily as needed for dizziness.   Multiple Vitamin (MULTIVITAMIN WITH MINERALS) TABS tablet Take 1 tablet by mouth daily.   ondansetron (ZOFRAN ODT) 8 MG disintegrating tablet Take 1 tablet (8 mg total) by mouth every 8 (eight) hours as needed for nausea or vomiting.   rivastigmine (EXELON) 1.5 MG capsule Take 1 capsule daily for 1 week, then increase to 1 capsule twice a day   terbinafine (LAMISIL) 250 MG tablet Take 1 tablet (250 mg total) by mouth daily.   No facility-administered encounter medications on file as of 11/18/2020.    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    Multiple trauma    Benign prostatic hyperplasia    Essential hypertension    Stage 3b chronic kidney disease (HCC)    Thrombocytopenia    History of multiple falls 07/14/2019   History of SAH (subarachnoid hemorrhage) 07/14/2019   Type 2 diabetes mellitus    Eosinophilia    Nocturnal leg cramps 11/23/2012   Pain in joint, shoulder region 11/23/2012   Hematoma 11/04/2012    Hyperlipidemia type IIB / III 03/08/2008   Orthostatic hypotension 03/08/2008   Coronary atherosclerosis of native coronary artery 03/08/2008    Past Medical History:  Diagnosis Date   Acute blood loss anemia    Acute upper respiratory infections of unspecified site 11/04/2012   AKI (acute kidney injury)    Benign prostatic hyperplasia    CAD (coronary artery disease)    s/p cypher DES to pLAD 6/08; normal LVF;  ETT-Myoview 2009: no ischemia    Clavicle fracture 08/10/2019   Closed displaced fracture of phalanx of left thumb, sequela 08/10/2019   Coronary atherosclerosis of native coronary artery 03/08/2008   Eosinophilia    Essential hypertension    Fracture of multiple ribs with pain 08/10/2019   History of multiple falls    History of SAH (subarachnoid hemorrhage) 07/14/2019   Hyperlipidemia type IIB / III 03/08/2008   Major neurocognitive disorder due to possible Alzheimer's disease, without behavioral disturbance 02/29/2020   MI (myocardial infarction)    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 Interim medical history since our last visit reviewed.  Review of Systems Per HPI unless specifically indicated above     Objective:    BP 140/82 (BP Location: Right Arm, Patient Position: Sitting)   Pulse 73   Temp 98 F (36.7 C)   Resp 12   Ht 5\' 7"  (1.702 m)   Wt 174 lb (78.9 kg)   SpO2 98%   BMI 27.25 kg/m   Wt Readings from Last 3 Encounters:  11/18/20 174 lb (78.9 kg)  09/24/20 180 lb (81.6 kg)  09/09/20 176 lb 9.6 oz (80.1 kg)    Physical Exam Vitals and nursing note reviewed.   Constitutional:      General: He is not in acute distress.    Appearance: Normal appearance. He is not toxic-appearing.  Eyes:     General: No scleral icterus.       Right eye: No discharge.        Left eye: No discharge.     Extraocular Movements: Extraocular movements intact.     Conjunctiva/sclera: Conjunctivae normal.     Pupils: Pupils are equal, round, and reactive to light.      Comments: Left horedolum to upper left lid.  Small amount erythema to left internal eye lid.  No surround erythema, pain, fluctuance.  Small skin tag noted to skin above right eye as pictured.  Skin:    General: Skin is warm and dry.     Findings: Erythema present.  Neurological:     Mental  Status: He is alert. Mental status is at baseline.  Psychiatric:        Mood and Affect: Mood normal.        Behavior: Behavior normal.        Thought Content: Thought content normal.        Judgment: Judgment normal.      Assessment & Plan:  1. Hordeolum externum of left upper eyelid Acute.  Continue warm compresses.  Start erythromycin ophthalmic ointment for blepharitis.  Follow up with no improvement.   - erythromycin ophthalmic ointment; Place 1 application into the left eye at bedtime for 7 days. Apply 1 cm ribbon into the left eye every night at bedtime for 7 days.  Dispense: 3.5 g; Refill: 0  2. Ulcerative blepharitis of left upper eyelid  - erythromycin ophthalmic ointment; Place 1 application into the left eye at bedtime for 7 days. Apply 1 cm ribbon into the left eye every night at bedtime for 7 days.  Dispense: 3.5 g; Refill: 0     Follow up plan: Return if symptoms worsen or fail to improve.

## 2020-11-19 LAB — LIPID PANEL
Cholesterol: 159 mg/dL (ref <200)
HDL: 67 mg/dL (ref >=40)
LDL Cholesterol (Calc): 78 mg/dL (calc)
Non-HDL Cholesterol (Calc): 92 mg/dL (calc) (ref <130)
Total CHOL/HDL Ratio: 2.4 (calc) (ref <5.0)
Triglycerides: 65 mg/dL (ref <150)

## 2020-11-19 LAB — CBC WITH DIFFERENTIAL/PLATELET
Absolute Monocytes: 479 cells/uL (ref 200–950)
Basophils Absolute: 20 cells/uL (ref 0–200)
Basophils Relative: 0.4 %
Eosinophils Absolute: 291 cells/uL (ref 15–500)
Eosinophils Relative: 5.7 %
HCT: 35.6 % — ABNORMAL LOW (ref 38.5–50.0)
Hemoglobin: 12.2 g/dL — ABNORMAL LOW (ref 13.2–17.1)
Lymphs Abs: 678 cells/uL — ABNORMAL LOW (ref 850–3900)
MCH: 32.7 pg (ref 27.0–33.0)
MCHC: 34.3 g/dL (ref 32.0–36.0)
MCV: 95.4 fL (ref 80.0–100.0)
MPV: 9.4 fL (ref 7.5–12.5)
Monocytes Relative: 9.4 %
Neutro Abs: 3631 cells/uL (ref 1500–7800)
Neutrophils Relative %: 71.2 %
Platelets: 206 10*3/uL (ref 140–400)
RBC: 3.73 10*6/uL — ABNORMAL LOW (ref 4.20–5.80)
RDW: 12.1 % (ref 11.0–15.0)
Total Lymphocyte: 13.3 %
WBC: 5.1 10*3/uL (ref 3.8–10.8)

## 2020-11-19 LAB — COMPLETE METABOLIC PANEL WITH GFR
AG Ratio: 1.6 (calc) (ref 1.0–2.5)
ALT: 14 U/L (ref 9–46)
AST: 26 U/L (ref 10–35)
Albumin: 4 g/dL (ref 3.6–5.1)
Alkaline phosphatase (APISO): 82 U/L (ref 35–144)
BUN/Creatinine Ratio: 19 (calc) (ref 6–22)
BUN: 34 mg/dL — ABNORMAL HIGH (ref 7–25)
CO2: 30 mmol/L (ref 20–32)
Calcium: 9.5 mg/dL (ref 8.6–10.3)
Chloride: 104 mmol/L (ref 98–110)
Creat: 1.78 mg/dL — ABNORMAL HIGH (ref 0.70–1.22)
Globulin: 2.5 g/dL (calc) (ref 1.9–3.7)
Glucose, Bld: 93 mg/dL (ref 65–99)
Potassium: 4.3 mmol/L (ref 3.5–5.3)
Sodium: 141 mmol/L (ref 135–146)
Total Bilirubin: 0.5 mg/dL (ref 0.2–1.2)
Total Protein: 6.5 g/dL (ref 6.1–8.1)
eGFR: 37 mL/min/{1.73_m2} — ABNORMAL LOW (ref >=60)

## 2020-11-19 LAB — HEMOGLOBIN A1C
Hgb A1c MFr Bld: 5.5 % of total Hgb (ref <5.7)
Mean Plasma Glucose: 111 mg/dL
eAG (mmol/L): 6.2 mmol/L

## 2020-11-19 LAB — MICROALBUMIN / CREATININE URINE RATIO
Creatinine, Urine: 106 mg/dL (ref 20–320)
Microalb Creat Ratio: 818 mcg/mg creat — ABNORMAL HIGH (ref <30)
Microalb, Ur: 86.7 mg/dL

## 2020-11-21 ENCOUNTER — Telehealth: Payer: Self-pay | Admitting: Pharmacist

## 2020-11-21 NOTE — Progress Notes (Signed)
    Chronic Care Management Pharmacy Assistant   Name: [name]  MRN: [id number] DOB: 09-20-1933  Reason for Encounter: CCM Care Plan  Medications: Outpatient Encounter Medications as of 11/21/2020  Medication Sig   albuterol (PROAIR HFA) 108 (90 Base) MCG/ACT inhaler INHALE 2 PUFFS INTO THE LUNGS EVERY 6 HOURS AS NEEDED FOR WHEEZING OR SHORTNESS OF BREATH (Patient taking differently: Inhale 2 puffs into the lungs every 6 (six) hours as needed for wheezing or shortness of breath.)   amLODipine (NORVASC) 5 MG tablet Take 1 tablet (5 mg total) by mouth daily. Please make yearly appt with Dr. [name] for November 2022 for future refills. Thank you 1st attempt   Apoaequorin (PREVAGEN EXTRA STRENGTH PO) Take 1 tablet by mouth daily.   atorvastatin (LIPITOR) 20 MG tablet Take 1 tablet (20 mg total) by mouth daily.   BREO ELLIPTA 200-25 MCG/INH AEPB INHALE 1 PUFF BY MOUTH EVERY DAY   Calcium Carb-Cholecalciferol (CALCIUM 600+D3 PO) Take 1 tablet by mouth 2 (two) times daily.   doxazosin (CARDURA) 2 MG tablet TAKE 1 TABLET BY MOUTH EVERY DAY (Patient taking differently: Take 2 mg by mouth daily.)   erythromycin ophthalmic ointment Place 1 application into the left eye at bedtime for 7 days. Apply 1 cm ribbon into the left eye every night at bedtime for 7 days.   escitalopram (LEXAPRO) 10 MG tablet Take 1 tablet (10 mg total) by mouth daily.   ezetimibe (ZETIA) 10 MG tablet TAKE 1 TABLET BY MOUTH EVERY DAY (Patient taking differently: Take 10 mg by mouth at bedtime.)   loratadine (CLARITIN) 10 MG tablet Take 10 mg by mouth daily as needed for allergies or rhinitis.    meclizine (ANTIVERT) 12.5 MG tablet Take 1 tablet (12.5 mg total) by mouth 3 (three) times daily as needed for dizziness.   Multiple Vitamin (MULTIVITAMIN WITH MINERALS) TABS tablet Take 1 tablet by mouth daily.   ondansetron (ZOFRAN ODT) 8 MG disintegrating tablet Take 1 tablet (8 mg total) by mouth every 8 (eight) hours as  needed for nausea or vomiting.   rivastigmine (EXELON) 1.5 MG capsule Take 1 capsule daily for 1 week, then increase to 1 capsule twice a day   terbinafine (LAMISIL) 250 MG tablet Take 1 tablet (250 mg total) by mouth daily.   No facility-administered encounter medications on file as of 11/21/2020.   Reviewed the patients initial visit reinsured it was completed per the pharmacist Leata Mouse request. Printed the CCM care plan. Mailed the patient CCM care plan to their most recent address on file.  Follow-Up:Pharmacist Review  Charlann Lange, [place] Pharmacist Assistant [phone]

## 2020-11-22 ENCOUNTER — Ambulatory Visit: Payer: PPO | Admitting: Physical Therapy

## 2020-11-24 ENCOUNTER — Other Ambulatory Visit: Payer: Self-pay

## 2020-11-24 ENCOUNTER — Ambulatory Visit: Payer: PPO | Admitting: Physical Therapy

## 2020-11-24 MED ORDER — LISINOPRIL 10 MG PO TABS: 10.0000 mg | ORAL_TABLET | Freq: Every day | ORAL | 3 refills | Status: DC

## 2020-11-29 ENCOUNTER — Ambulatory Visit: Payer: PPO | Admitting: Physical Therapy

## 2020-11-30 ENCOUNTER — Other Ambulatory Visit: Payer: Self-pay

## 2020-11-30 ENCOUNTER — Ambulatory Visit: Payer: PPO

## 2020-11-30 DIAGNOSIS — R42 Dizziness and giddiness: Secondary | ICD-10-CM

## 2020-11-30 DIAGNOSIS — H8112 Benign paroxysmal vertigo, left ear: Secondary | ICD-10-CM | POA: Diagnosis not present

## 2020-11-30 DIAGNOSIS — R2681 Unsteadiness on feet: Secondary | ICD-10-CM

## 2020-11-30 NOTE — Patient Instructions (Signed)
Gaze Stabilization: Sitting ° ° ° °Keeping eyes on target on wall 3-4 feet away, tilt head down 15-30° and move head side to side for 30 seconds. Repeat while moving head up and down for 30 seconds. °Do 2-3 sessions per day. ° ° °Gaze Stabilization: Tip Card ° °1.Target must remain in focus, not blurry, and appear stationary while head is in motion. °2.Perform exercises with small head movements (45° to either side of midline). °3.Increase speed of head motion so long as target is in focus. °4.If you wear eyeglasses, be sure you can see target through lens (therapist will give specific instructions for bifocal / progressive lenses). °5.These exercises may provoke dizziness or nausea. Work through these symptoms. If too dizzy, slow head movement slightly. Rest between each exercise. °6.Exercises demand concentration; avoid distractions. °7.For safety, perform standing exercises close to a counter, wall, corner, or next to someone. ° °Copyright © VHI. All rights reserved.  ° °

## 2020-11-30 NOTE — Therapy (Addendum)
[facility] [address] [place], [place], [address] Phone: [phone]   Fax:  [phone]  Physical Therapy Treatment and D/C Summary  Patient Details  Name: [name] MRN: [id number] Date of Birth: 03-Sep-1933 Referring Provider (PT): Cameron Sprang, MD  PHYSICAL THERAPY DISCHARGE SUMMARY  Visits from Start of Care: 3  Current functional level related to goals / functional outcomes: See LTG achievement and impression statement below; pt did not return with new onset of dizziness.   Remaining deficits: N/A   Education / Equipment: HEP   Patient agrees to discharge. Patient goals were met. Patient is being discharged due to meeting the stated rehab goals.  Rico Junker, PT, DPT 04/24/21    11:22 AM    Encounter Date: 11/30/2020   PT End of Session - 11/30/20 0716     Visit Number 3    Number of Visits 9    Date for PT Re-Evaluation 12/01/20    Authorization Type [employer]; $30 copay    Progress Note Due on Visit 10    PT Start Time 0716    PT Stop Time 0752    PT Time Calculation (min) 36 min    Activity Tolerance Patient tolerated treatment well    Behavior During Therapy [facility] for tasks assessed/performed             Past Medical History:  Diagnosis Date   Acute blood loss anemia    Acute upper respiratory infections of unspecified site 11/04/2012   AKI (acute kidney injury)    Benign prostatic hyperplasia    CAD (coronary artery disease)    s/p cypher DES to pLAD 6/08; normal LVF;  ETT-Myoview 2009: no ischemia    Clavicle fracture 08/10/2019   Closed displaced fracture of phalanx of left thumb, sequela 08/10/2019   Coronary atherosclerosis of native coronary artery 03/08/2008   Eosinophilia    Essential hypertension    Fracture of multiple ribs with pain 08/10/2019   History of multiple falls    History of SAH (subarachnoid hemorrhage) 07/14/2019   Hyperlipidemia type IIB /  III 03/08/2008   Major neurocognitive disorder due to possible Alzheimer's disease, without behavioral disturbance 02/29/2020   MI (myocardial infarction)    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   Subjective Assessment - 11/30/20 0719     Subjective Patient and wife reports have had a busy month with taking care of her father. Reports the only time he has had an episode when he sits up quickly. No falls.    Patient is accompained by: Family member    Pertinent History Anemia, acute URI, AKI, CAD, clavical fx, closed displaced fracture of phalanx of L thumb, HTN, multiple rib fractures, multiple falls, SAH, HLD, major neurocognitive  disorder due to possible Alzheimer's disease, MI, orthostatic hypotension, trigeminal neuralgia, type 2 DM    Limitations Standing;Walking    Patient Stated Goals "One and Done."    Currently in Pain? No/denies                Vestibular Assessment - 11/30/20 0001       Positional Testing   Sidelying Test Sidelying Right;Sidelying Left    Horizontal Canal Testing Horizontal Canal Right;Horizontal Canal Left      Sidelying Right   Sidelying Right Duration 0    Sidelying Right Symptoms No nystagmus      Sidelying Left   Sidelying Left Duration 0    Sidelying Left Symptoms No nystagmus      Horizontal Canal Right    Horizontal Canal Right Duration 0    Horizontal Canal Right Symptoms Normal      Horizontal Canal Left   Horizontal Canal Left Duration 0    Horizontal Canal Left Symptoms Normal              Vestibular Treatment/Exercise - 11/30/20 0001       Vestibular Treatment/Exercise   Vestibular Treatment Provided Gaze;Habituation    Habituation Exercises Horizontal Roll    Gaze Exercises X1 Viewing Horizontal;X1 Viewing Vertical      Horizontal Roll   Symptom Description  reviewed habituation with rolling; able to complete correctly no symptoms.      X1 Viewing Horizontal   Foot Position seated    Reps 2    Comments x 30 seconds.      X1 Viewing Vertical   Foot Position seated    Reps 2    Comments x 30 seconds             Gaze Stabilization: Sitting    Keeping eyes on target on wall 3-4 feet away, tilt head down 15-30 and move head side to side for 30 seconds. Repeat while moving head up and down for 30 seconds. Do 2-3 sessions per day.    Gaze Stabilization: Tip Card  1.Target must remain in focus, not blurry, and appear stationary while head is in motion. 2.Perform exercises with small head movements (45 to either side of midline). 3.Increase speed of head motion so long as target is in focus. 4.If you wear eyeglasses, be sure you can see target through lens (therapist will give specific instructions for bifocal / progressive lenses). 5.These exercises may provoke dizziness or nausea. Work through these symptoms. If too dizzy, slow head movement slightly. Rest between each exercise. 6.Exercises demand concentration; avoid distractions. 7.For safety, perform standing exercises close to a counter, wall, corner, or next to someone.  Copyright  VHI. All rights reserved.        PT Education - 11/30/20 0754     Education Details HEP update    Person(s) Educated Patient;Spouse    Methods Explanation;Demonstration;Handout    Comprehension Verbalized  understanding;Returned demonstration                 PT Long Term Goals - 11/30/20 0752       PT LONG TERM GOAL #1   Title Pt will demonstrate ability to perform vestibular/balance HEP with wife's supervision    Baseline reports independence with HEP    Time 4    Period Weeks    Status Achieved      PT LONG TERM GOAL #2   Title Pt will report no dizziness with rolling/bed mobility and will demonstrate negative positional  testing    Baseline apogeotrophic nystagmus with roll test; no nystagmus or reports of dizziness at this time    Time 4    Period Weeks    Status Achieved      PT LONG TERM GOAL #3   Title Pt will report no dizziness when performing turns in standing, looking up or bending down to the ground    Baseline denies dizziness with these activities    Time 4    Period Weeks    Status Achieved      PT LONG TERM GOAL #4   Title Pt will increase DFS to >/= 50 and increase DPS to >/= 60 on FOTO    Baseline DFS: 39, DPS: 55.6    Time 4    Period Weeks    Status New      PT LONG TERM GOAL #5   Title Pt will perform TUG with rollator safely in </= 17 sec    Baseline 10.66 secs    Time 4    Period Weeks    Status Achieved                   Plan - 11/30/20 0754     Clinical Impression Statement Reassessed BPPV with no nystagmus or dizziness reported. Patient denie dizziness with rolling, bed mobility, and functional activities during session. Contineud review of habituation and initiated VOR x 1 seated with patient tolerating well. due to resolution of dizziness will go on hold at this time, with plan to return if dizziness/vertigo returns. Patient in agreement.    Personal Factors and Comorbidities Comorbidity 3+;Past/Current Experience;Age    Comorbidities Anemia, acute URI, AKI, CAD, clavical fx, closed displaced fracture of phalanx of L thumb, HTN, multiple rib fractures, multiple falls, SAH, HLD, major neurocognitive disorder due to possible  Alzheimer's disease, MI, orthostatic hypotension, trigeminal neuralgia, type 2 DM    Examination-Activity Limitations Bend;Locomotion Level;Reach Overhead;Stand;Transfers    Examination-Participation Restrictions Community Activity    Stability/Clinical Decision Making Evolving/Moderate complexity    Rehab Potential Good    PT Frequency 2x / week    PT Duration 4 weeks    PT Treatment/Interventions ADLs/Self Care Home Management;Canalith Repostioning;DME Instruction;Gait training;Functional mobility training;Therapeutic activities;Therapeutic exercise;Balance training;Neuromuscular re-education;Patient/family education;Vestibular    PT Next Visit Plan Any dizziness upon return?  HEP VOR and balance additions.    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 [place], [place], [address] Phone: [phone]   Fax:  [phone]  Name: [name] MRN: [id number] Date of Birth: 09-May-1933

## 2020-12-05 DIAGNOSIS — I10 Essential (primary) hypertension: Secondary | ICD-10-CM

## 2020-12-05 DIAGNOSIS — E782 Mixed hyperlipidemia: Secondary | ICD-10-CM | POA: Diagnosis not present

## 2020-12-12 ENCOUNTER — Other Ambulatory Visit: Payer: Self-pay | Admitting: Cardiovascular Disease

## 2020-12-12 ENCOUNTER — Ambulatory Visit (INDEPENDENT_AMBULATORY_CARE_PROVIDER_SITE_OTHER): Payer: PPO | Admitting: Family Medicine

## 2020-12-12 ENCOUNTER — Other Ambulatory Visit: Payer: Self-pay

## 2020-12-12 DIAGNOSIS — J069 Acute upper respiratory infection, unspecified: Secondary | ICD-10-CM | POA: Diagnosis not present

## 2020-12-12 LAB — INFLUENZA A AND B AG, IMMUNOASSAY
INFLUENZA A ANTIGEN: NOT DETECTED
INFLUENZA B ANTIGEN: NOT DETECTED

## 2020-12-12 NOTE — Progress Notes (Signed)
Subjective:    Patient ID: [name], male    DOB: 11/23/1933, 87 y.o.   MRN: [id number]  HPI  Patient is a very pleasant 87 year old Caucasian gentleman being seen today as a telephone visit.  Phone call began at 850.  Phone call concluded at 9:00.  Patient consents to be seen via telephone.  He is currently at home.  I am currently in my office.  He reports 2-day history of flulike symptoms.  Symptoms include fever, myalgias and arthralgias, dull headache, nausea, and cough.  He has been taking Tylenol for the fever and that has seemed to help.  This morning he is feeling better.  He has not done a COVID test yet.  Has not had a flu test.  He denies any shortness of breath or chest pain Past Medical History:  Diagnosis Date   Acute blood loss anemia    Acute upper respiratory infections of unspecified site 11/04/2012   AKI (acute kidney injury)    Benign prostatic hyperplasia    CAD (coronary artery disease)    s/p cypher DES to pLAD 6/08; normal LVF;  ETT-Myoview 2009: no ischemia    Clavicle fracture 08/10/2019   Closed displaced fracture of phalanx of left thumb, sequela 08/10/2019   Coronary atherosclerosis of native coronary artery 03/08/2008   Eosinophilia    Essential hypertension    Fracture of multiple ribs with pain 08/10/2019   History of multiple falls    History of SAH (subarachnoid hemorrhage) 07/14/2019   Hyperlipidemia type IIB / III 03/08/2008   Major neurocognitive disorder due to possible Alzheimer's disease, without behavioral disturbance 02/29/2020   MI (myocardial infarction)    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   Imaging revealed SAH   Nocturnal leg cramps 11/23/2012   Orthostatic hypotension 03/08/2008   Pain in joint, shoulder region 11/23/2012   Stage 3b chronic kidney disease    Thrombocytopenia    Trigeminal neuralgia    Type 2 diabetes mellitus    Past Surgical History:  Procedure Laterality Date    APPENDECTOMY     CARDIAC CATHETERIZATION  07/30/2006   CORONARY ANGIOPLASTY WITH STENT PLACEMENT   CARDIAC CATHETERIZATION     EXPLORATORY LAPAROTOMY     age 38   EXPLORATORY LAPAROTOMY     IR THORACENTESIS ASP PLEURAL SPACE W/IMG GUIDE  10/05/2019   Current Outpatient Medications on File Prior to Visit  Medication Sig Dispense Refill   albuterol (PROAIR HFA) 108 (90 Base) MCG/ACT inhaler INHALE 2 PUFFS INTO THE LUNGS EVERY 6 HOURS AS NEEDED FOR WHEEZING OR SHORTNESS OF BREATH (Patient taking differently: Inhale 2 puffs into the lungs every 6 (six) hours as needed for wheezing or shortness of breath.) 6.7 g 0   amLODipine (NORVASC) 5 MG tablet Take 1 tablet (5 mg total) by mouth daily. Please make yearly appt with Dr. [name] for November 2022 for future refills. Thank you 1st attempt 90 tablet 0   Apoaequorin (PREVAGEN EXTRA STRENGTH PO) Take 1 tablet by mouth daily.     atorvastatin (LIPITOR) 20 MG tablet Take 1 tablet (20 mg total) by mouth daily. 90 tablet 1   BREO ELLIPTA 200-25 MCG/INH AEPB INHALE 1 PUFF BY MOUTH EVERY DAY 60 each 1   Calcium Carb-Cholecalciferol (CALCIUM 600+D3 PO) Take 1 tablet by mouth 2 (two) times daily.     doxazosin (CARDURA) 2 MG tablet TAKE 1 TABLET BY MOUTH EVERY DAY (Patient taking differently: Take 2 mg by mouth  daily.) 90 tablet 3   escitalopram (LEXAPRO) 10 MG tablet Take 1 tablet (10 mg total) by mouth daily. 90 tablet 2   ezetimibe (ZETIA) 10 MG tablet TAKE 1 TABLET BY MOUTH EVERY DAY (Patient taking differently: Take 10 mg by mouth at bedtime.) 90 tablet 3   lisinopril (ZESTRIL) 10 MG tablet Take 1 tablet (10 mg total) by mouth daily. 90 tablet 3   loratadine (CLARITIN) 10 MG tablet Take 10 mg by mouth daily as needed for allergies or rhinitis.      meclizine (ANTIVERT) 12.5 MG tablet Take 1 tablet (12.5 mg total) by mouth 3 (three) times daily as needed for dizziness. 15 tablet 0   Multiple Vitamin (MULTIVITAMIN WITH MINERALS) TABS tablet Take 1 tablet by  mouth daily.     ondansetron (ZOFRAN ODT) 8 MG disintegrating tablet Take 1 tablet (8 mg total) by mouth every 8 (eight) hours as needed for nausea or vomiting. 10 tablet 0   rivastigmine (EXELON) 1.5 MG capsule Take 1 capsule daily for 1 week, then increase to 1 capsule twice a day 60 capsule 6   terbinafine (LAMISIL) 250 MG tablet Take 1 tablet (250 mg total) by mouth daily. 90 tablet 3   No current facility-administered medications on file prior to visit.   Allergies  Allergen Reactions   Gabapentin Other (See Comments)    Visual changes and confusion- "I went blind while I was driving"   Social History   Socioeconomic History   Marital status: Married    Spouse name: [name]   Number of children: 1   Years of education: 20   Highest education level: Occupational hygienist History   Occupation: retired professor    Comment: History    Occupation: missionary  Tobacco Use   Smoking status: Never   Smokeless tobacco: Never  Scientific laboratory technician Use: Never used  Substance and Sexual Activity   Alcohol use: Not Currently   Drug use: Never   Sexual activity: Yes    Partners: Female  Other Topics Concern   Not on file  Social History Narrative   ** Merged History Encounter **       Lives with his wife (second marriage in 2015, first marriage ended when his wife died alzheimer's disease). Since his remarriage, his adult daughter doesn't speak with him.      Right handed   Social Determinants of Health   Financial Resource Strain: Low Risk    Difficulty of Paying Living Expenses: Not very hard  Food Insecurity: Not on file  Transportation Needs: Not on file  Physical Activity: Not on file  Stress: Not on file  Social Connections: Not on file  Intimate Partner Violence: Not on file     Review of Systems  All other systems reviewed and are negative.     Objective:   Physical Exam        Assessment & Plan:   Viral URI I asked the patient to drive to my  office.  I would like him to do a COVID test at home.  If positive, I will call on antivirals for COVID.  If negative, I would like him to come to the office so that we can check a flu test.  If his flu test is positive I will call out Tamiflu.  I would also like to listen to his lungs and check his oxygen saturation at the office.  Therefore we concluded the telephone call with the plan being  that the patient would drive to my office for testing if his COVID test at home was negative

## 2020-12-12 NOTE — Addendum Note (Signed)
Addended by: Sheral Flow on: 12/12/2020 11:02 AM   Modules accepted: Orders

## 2020-12-14 ENCOUNTER — Telehealth: Payer: Self-pay | Admitting: Family Medicine

## 2020-12-14 ENCOUNTER — Other Ambulatory Visit: Payer: Self-pay | Admitting: Family Medicine

## 2020-12-14 NOTE — Telephone Encounter (Signed)
Received voicemail from patient's spouse [name] to report patient using tylenol every 6 hours for fever. Concerned about liver. Requesting call back. Please advise at [phone]

## 2020-12-14 NOTE — Telephone Encounter (Signed)
Returned call to patient wife, [name].   Reports that patient continues to have fever ranging from 99.1- 101.0. States that patient is using APAP as he is not able to use IBU with decreased kidney function.   States that the last time he had to use APAP Q 6hr, LFT's became elevated.   Inquired if there is another treatment that can be used. Please advise.

## 2020-12-15 ENCOUNTER — Telehealth: Payer: Self-pay | Admitting: *Deleted

## 2020-12-15 ENCOUNTER — Other Ambulatory Visit: Payer: Self-pay | Admitting: Family Medicine

## 2020-12-15 ENCOUNTER — Other Ambulatory Visit: Payer: Self-pay | Admitting: *Deleted

## 2020-12-15 ENCOUNTER — Other Ambulatory Visit: Payer: Self-pay

## 2020-12-15 ENCOUNTER — Ambulatory Visit
Admission: RE | Admit: 2020-12-15 | Discharge: 2020-12-15 | Disposition: A | Discharge status: Home / Self Care | Payer: PPO | Source: Ambulatory Visit | Attending: Family Medicine | Admitting: Family Medicine

## 2020-12-15 DIAGNOSIS — R079 Chest pain, unspecified: Secondary | ICD-10-CM | POA: Diagnosis not present

## 2020-12-15 DIAGNOSIS — S2242XA Multiple fractures of ribs, left side, initial encounter for closed fracture: Secondary | ICD-10-CM | POA: Diagnosis not present

## 2020-12-15 DIAGNOSIS — J90 Pleural effusion, not elsewhere classified: Secondary | ICD-10-CM

## 2020-12-15 DIAGNOSIS — R918 Other nonspecific abnormal finding of lung field: Secondary | ICD-10-CM | POA: Diagnosis not present

## 2020-12-15 IMAGING — CR DG RIBS 2V*L*
2 series · 2 of 2 positions shown · non-contrast
Comparison: [DATE].

CLINICAL DATA: chest pain with deep breath; L sided chest pain with
deep breath

EXAM:
CHEST - 2 VIEW; LEFT RIBS - 2 VIEW

[w ribs ap upper left]
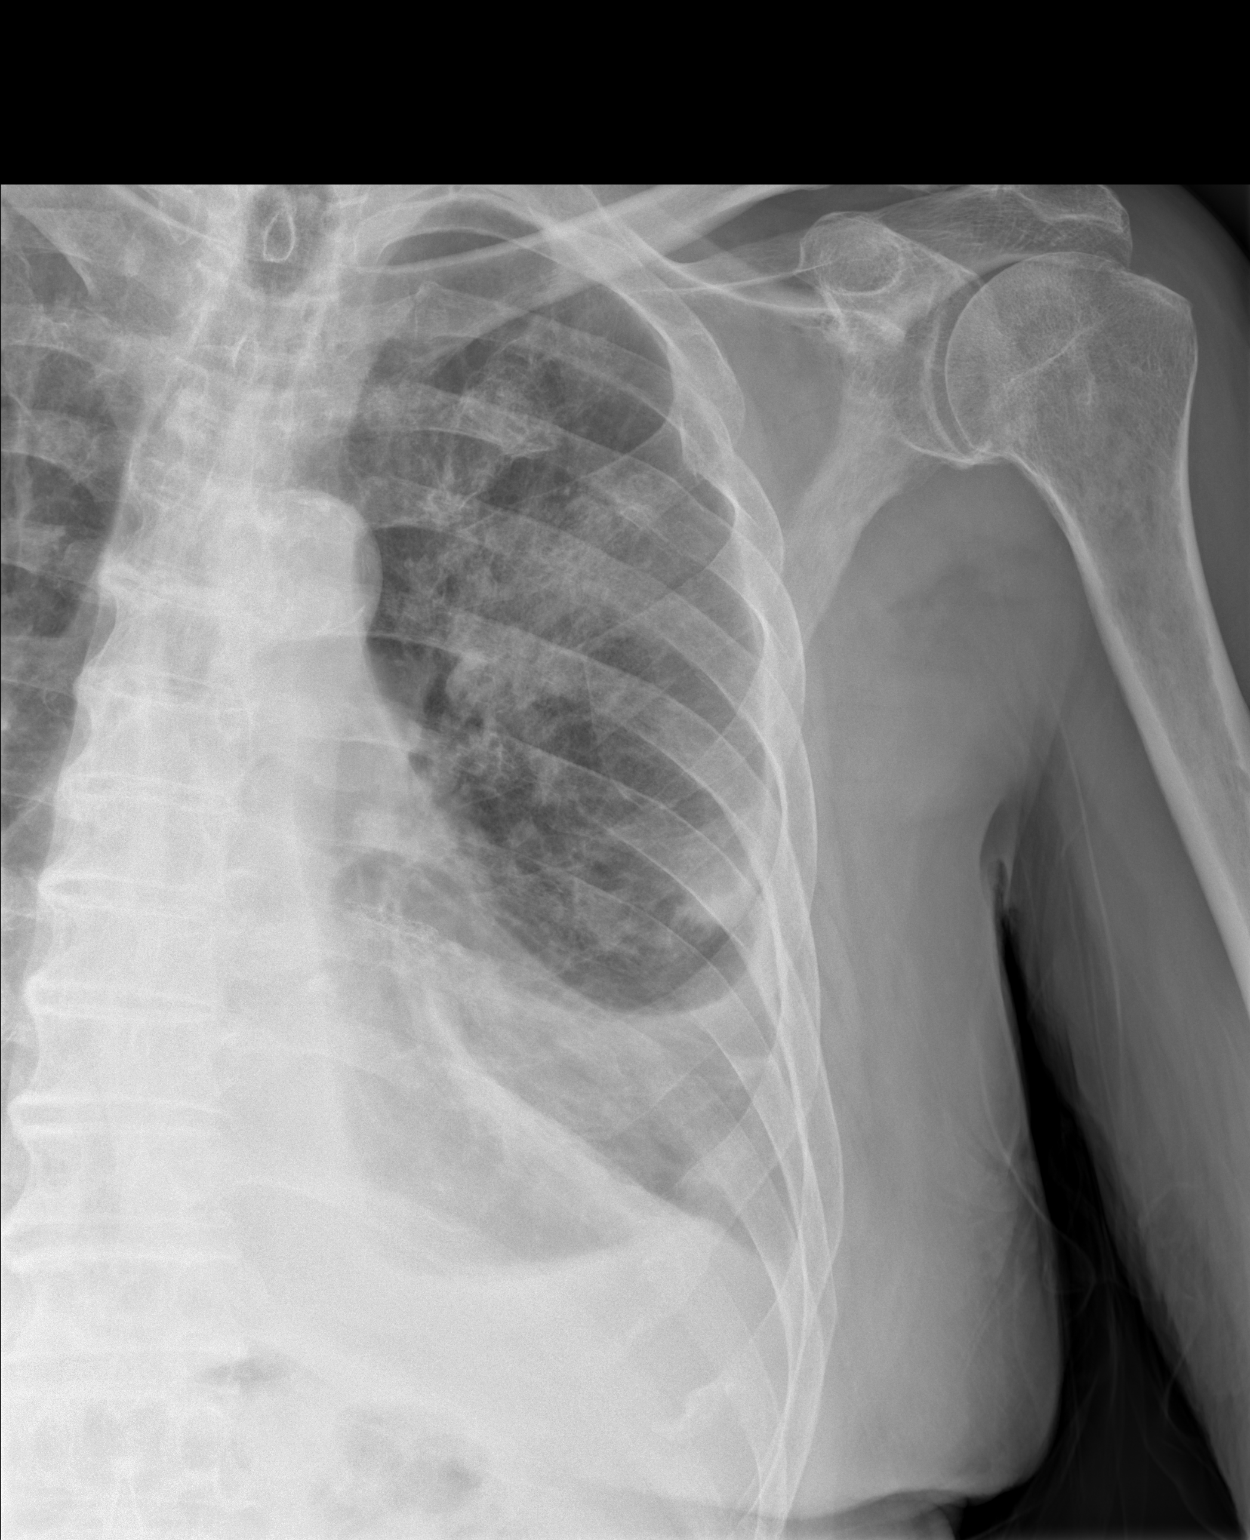

[w ribs obl left]
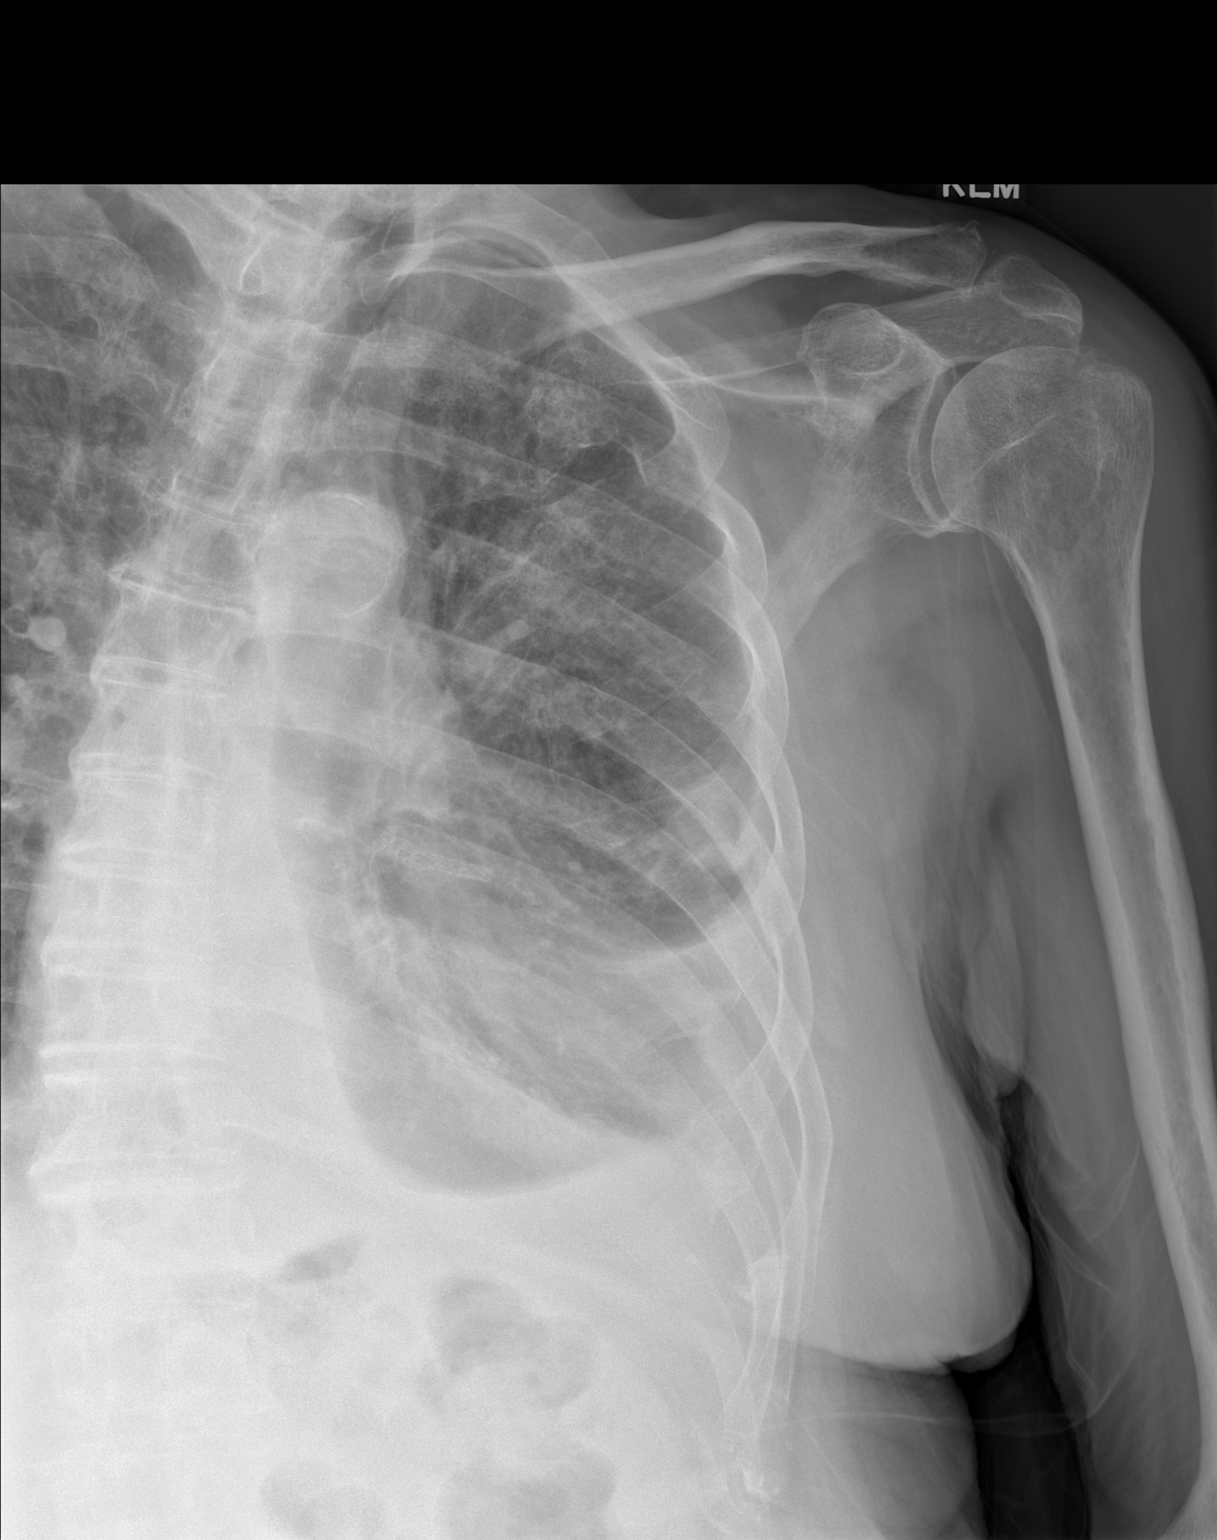

[2 of 2 positions shown; findings below may reference images not displayed]

FINDINGS: Patchy airspace opacities throughout the left lung, concerning for
multifocal pneumonia. Small left pleural effusion. Cardiomediastinal
silhouette is within normal limits. Redemonstrated left third rib
fracture. Question nondisplaced left fourth and potentially fifth
and seventh rib fractures, age indeterminate.
IMPRESSION: 1. Patchy airspace opacities throughout the left lung, concerning
for multifocal pneumonia. Followup PA and lateral chest X-ray is
recommended in 3-4 weeks following trial of antibiotic therapy to
ensure resolution and exclude underlying malignancy.
2. Redemonstrated left third rib fracture. Question nondisplaced
left fourth and potentially fifth and seventh rib fractures, age
indeterminate. CT of the chest could further evaluate if clinically
indicated.
3. Chronic small left pleural effusion.

## 2020-12-15 MED ORDER — AMOXICILLIN-POT CLAVULANATE 875-125 MG PO TABS: 1.0000 | ORAL_TABLET | Freq: Two times a day (BID) | ORAL | 0 refills | Status: DC

## 2020-12-15 MED ORDER — AZITHROMYCIN 250 MG PO TABS
ORAL_TABLET | ORAL | 0 refills | Status: AC
Start: 2020-12-15 — End: 2020-12-20

## 2020-12-15 NOTE — Telephone Encounter (Signed)
Call placed to patient and patient wife made aware.   Reports that patient has L sided chest pain that is sharp and stabbing when he tries to take a deep breath. Also reports pressure in L ear and L side of face. States that oral temperature noted at 105. Patient is taking APAP now to reduce fever.   PCP reports that pain can be due to PNA, rib Fx, muscle sprain, or blood clot. If no SOB noted, obtain CXR to R/O PNA/ Fx.   Agreeable to plan. Imaging ordered.

## 2020-12-15 NOTE — Telephone Encounter (Signed)
Received call from patient wife, [name].   Reports that patient does have a bird in the home and patient has had issues in the past with the droppings.   Inquired if PCA could be fungal from bird.   Please advise.

## 2020-12-16 NOTE — Telephone Encounter (Signed)
Call placed to patient and patient wife made aware.  [name] also reports that patient has not been taking memory medication as he was switching the medication. States that since he has been sick, she has not started the new meds. Reports that confusion is notably worse.   Advised that confusion is most likely due to infection/ fever and should return to baseline. Then medication can be started.

## 2020-12-19 ENCOUNTER — Telehealth: Payer: Self-pay | Admitting: Family Medicine

## 2020-12-19 NOTE — Telephone Encounter (Signed)
Received call from patient's spouse [name]; called to report paperwork will be coming through to appeal increase of copay (has more than doubled) for patient's refill of fluticasone furoate-vilanterol (BREO ELLIPTA) 200-25 MCG/ACT AEPB  Expecting fax to come through at any time from [employer]; has a 72 hour turn around period. Patient willing to accept assistance from pharmacist with copayments through end of 2022.   Please advise at [phone].

## 2020-12-19 NOTE — Telephone Encounter (Signed)
Awaiting forms

## 2020-12-20 ENCOUNTER — Encounter: Payer: Self-pay | Admitting: Family Medicine

## 2020-12-20 NOTE — Telephone Encounter (Signed)
Received fax for Tier Exception for Breo.   Noted that Memory Dance was started in July 2021 during hospitalization for "asthma" but no actual Dx listed.   Patient has tried and failed: Albuterol  Advair  Forms routed to provider.

## 2020-12-21 DIAGNOSIS — J181 Lobar pneumonia, unspecified organism: Secondary | ICD-10-CM | POA: Diagnosis not present

## 2020-12-22 NOTE — Telephone Encounter (Signed)
Received Tier Exception determination.   Exception denied as medication is already at the lowest possible tier for the brand status of drug.   Call placed to patient and patient wife [name] made aware of denial.   States that they will pay the increased co-pay for the next (2) months and co-pay should return to normal in the new year.

## 2020-12-24 ENCOUNTER — Other Ambulatory Visit: Payer: Self-pay | Admitting: Family Medicine

## 2020-12-26 ENCOUNTER — Encounter: Payer: Self-pay | Admitting: Neurology

## 2021-01-05 ENCOUNTER — Other Ambulatory Visit: Payer: Self-pay

## 2021-01-05 ENCOUNTER — Ambulatory Visit
Admission: RE | Admit: 2021-01-05 | Discharge: 2021-01-05 | Disposition: A | Discharge status: Home / Self Care | Payer: PPO | Source: Ambulatory Visit | Attending: Family Medicine | Admitting: Family Medicine

## 2021-01-05 DIAGNOSIS — I3139 Other pericardial effusion (noninflammatory): Secondary | ICD-10-CM | POA: Diagnosis not present

## 2021-01-05 DIAGNOSIS — R918 Other nonspecific abnormal finding of lung field: Secondary | ICD-10-CM | POA: Diagnosis not present

## 2021-01-05 DIAGNOSIS — R911 Solitary pulmonary nodule: Secondary | ICD-10-CM | POA: Diagnosis not present

## 2021-01-05 DIAGNOSIS — I700 Atherosclerosis of aorta: Secondary | ICD-10-CM | POA: Diagnosis not present

## 2021-01-05 DIAGNOSIS — J90 Pleural effusion, not elsewhere classified: Secondary | ICD-10-CM

## 2021-01-05 DIAGNOSIS — J9811 Atelectasis: Secondary | ICD-10-CM | POA: Diagnosis not present

## 2021-01-05 IMAGING — CT CT CHEST W/O CM
1 series · 14 of 34 positions shown, 18 images · non-contrast
Comparison: Chest radiographs, [DATE], CT chest, [DATE]

CLINICAL DATA: Evaluate for pleural effusion

EXAM:
CT CHEST WITHOUT CONTRAST
TECHNIQUE: Multidetector CT imaging of the chest was performed following the
standard protocol without IV contrast.

[Series 2: chest w/(date) · axial · 0.73mm/px · z∈[-342,-60]mm · 14 of 167 slices shown, 18 images]
[im 13/167  mediastinal]
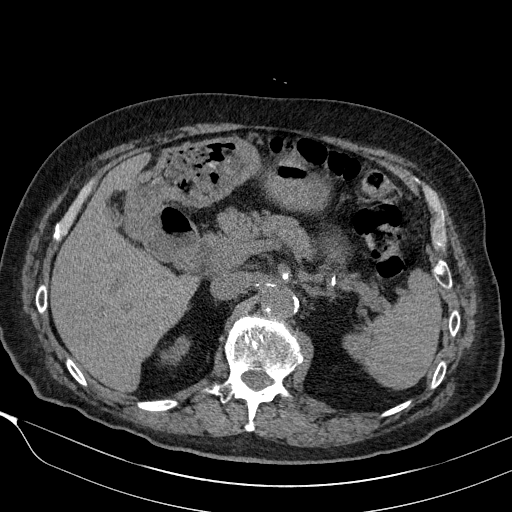
[im 13/167  lung]
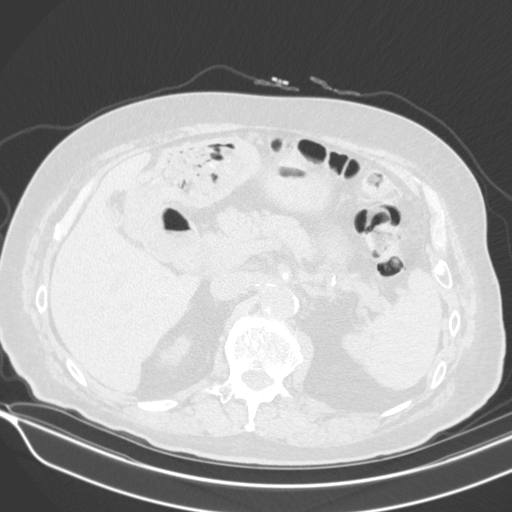
[im 25/167  lung]
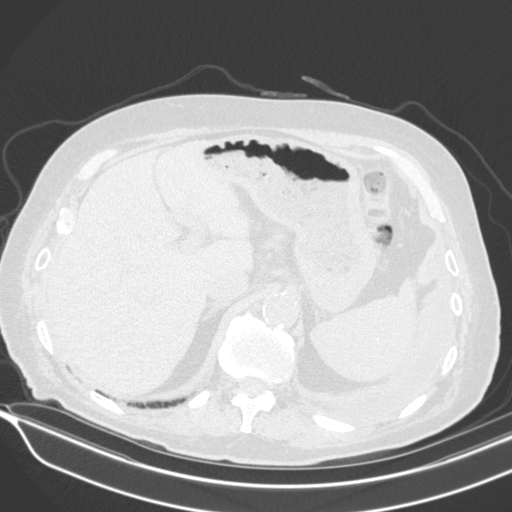
[im 34/167  lung]
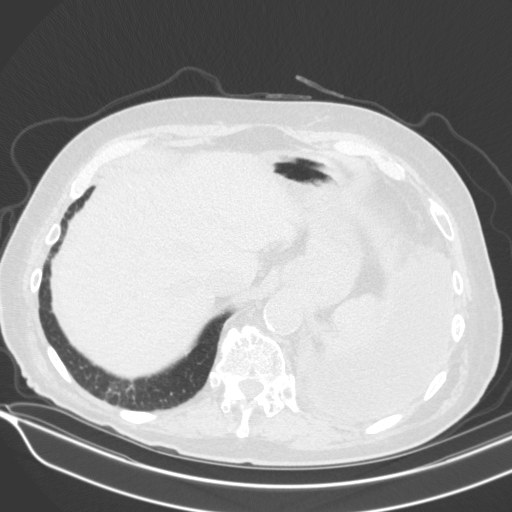
[im 50/167  lung]
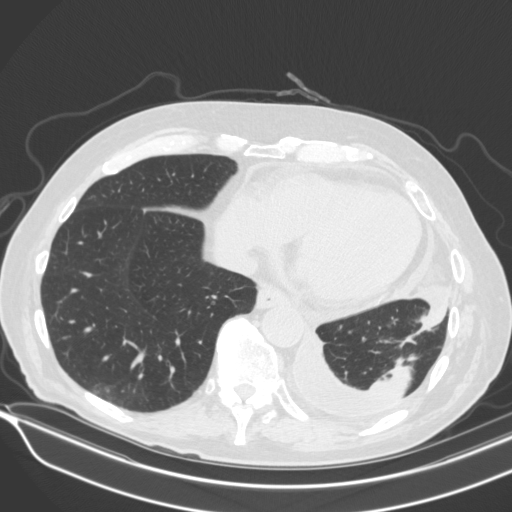
[im 62/167  mediastinal]
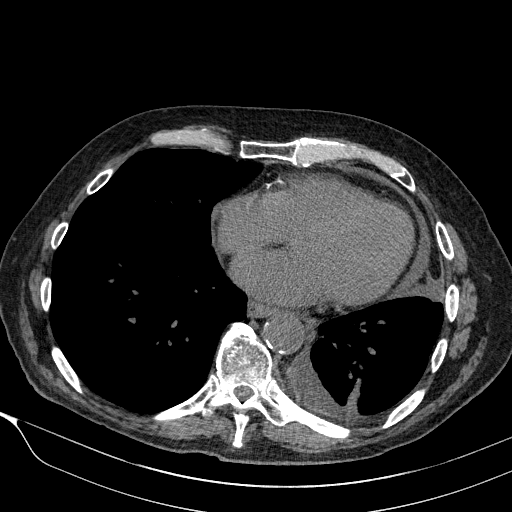
[im 62/167  lung]
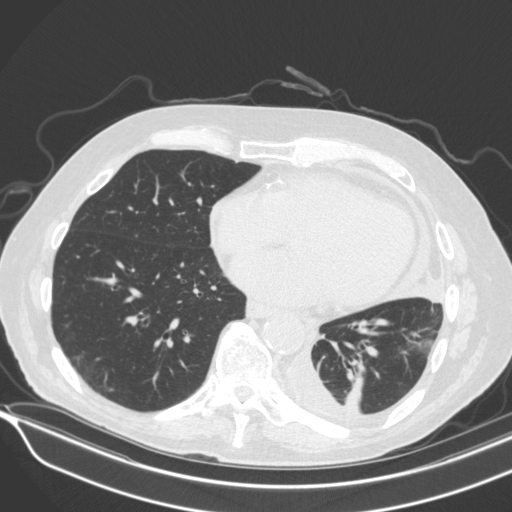
[im 68/167  lung]
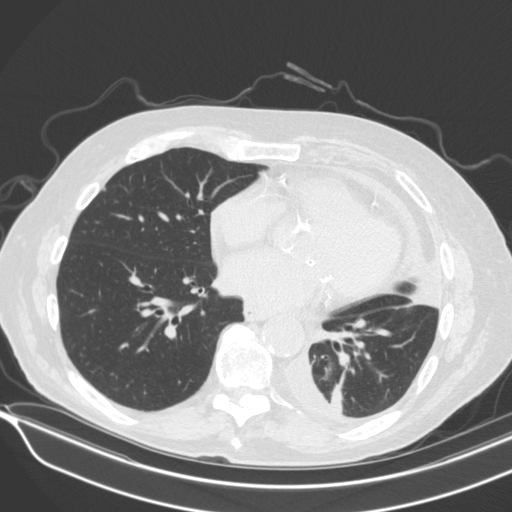
[im 79/167  lung]
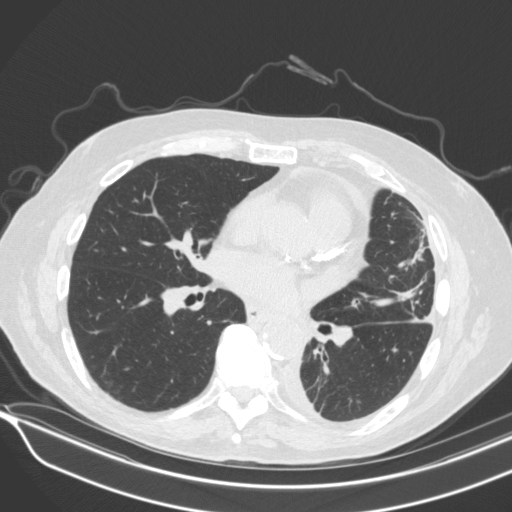
[im 89/167  lung]
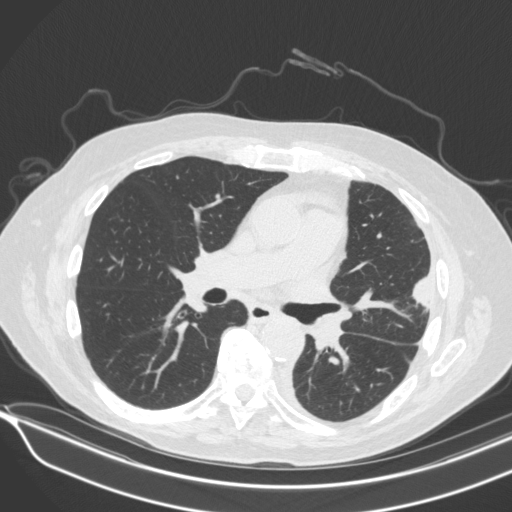
[im 99/167  mediastinal]
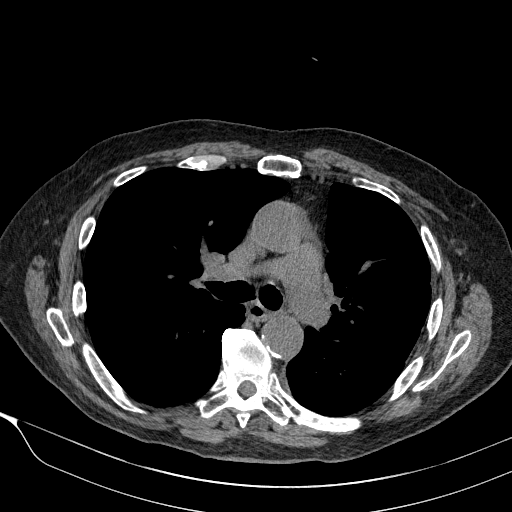
[im 99/167  lung]
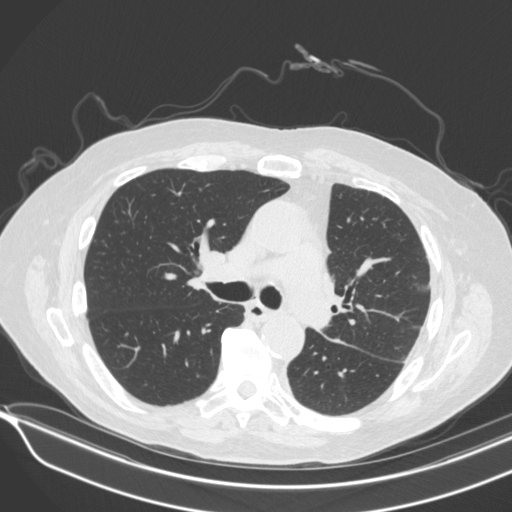
[im 105/167  lung]
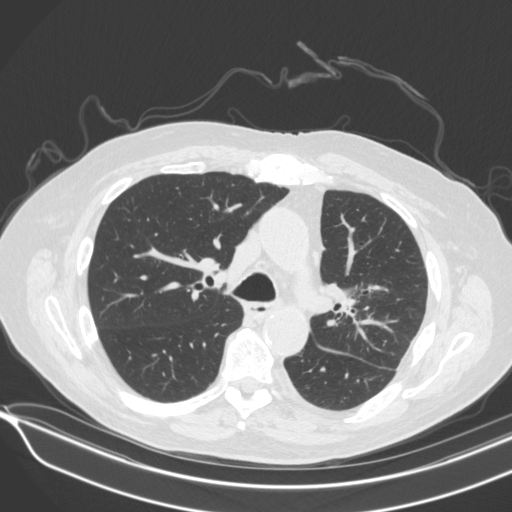
[im 123/167  lung]
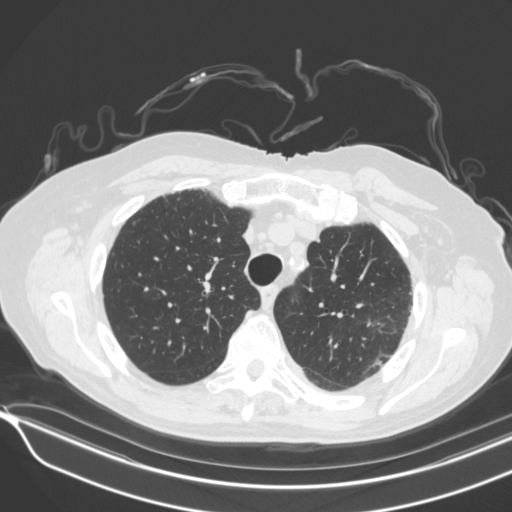
[im 133/167  lung]
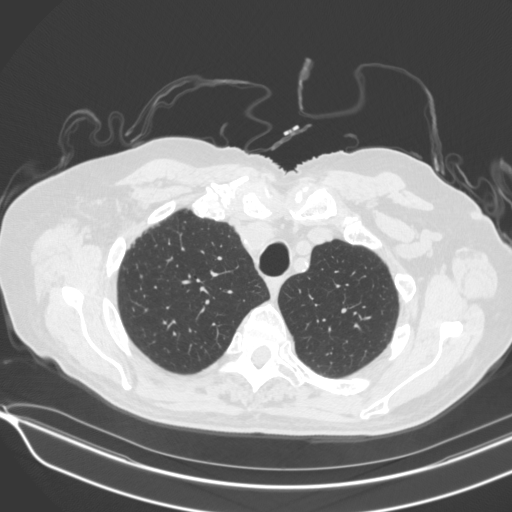
[im 142/167  mediastinal]
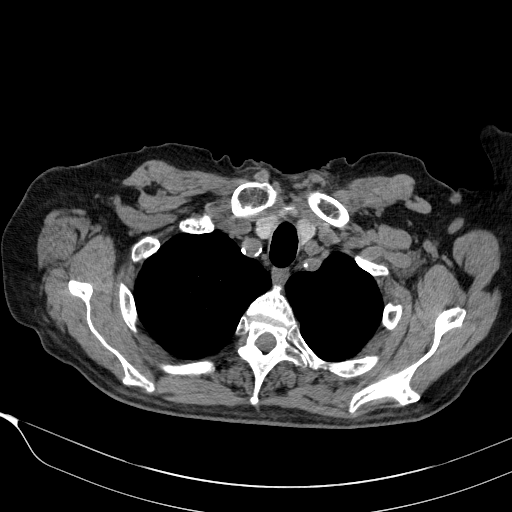
[im 142/167  lung]
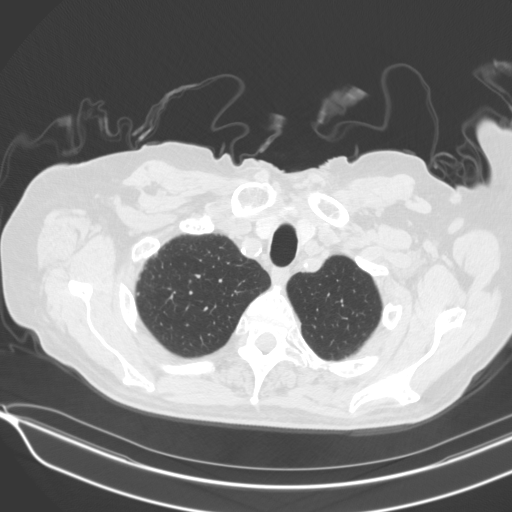
[im 154/167  lung]
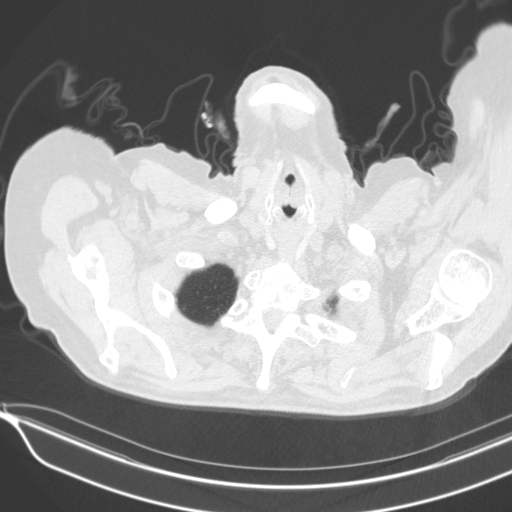

[14 of 34 positions shown; findings below may reference images not displayed]

FINDINGS: Cardiovascular: Aortic atherosclerosis. Normal heart size. Extensive
3 vessel coronary artery calcifications and/or stents. Small
pericardial effusion.

Mediastinum/Nodes: No enlarged mediastinal, hilar, or axillary lymph
nodes. Thyroid gland, trachea, and esophagus demonstrate no
significant findings.

Lungs/Pleura: Small, chronic, loculated left pleural effusion with
associated smooth pleural thickening. Bandlike scarring,
atelectasis, and subpleural rounded atelectasis of the left lower
lobe and lingula (series 5, image 83). There is a somewhat
ill-defined irregular nodular consolidation of the suprahilar left
upper lobe, measuring approximately 2.0 x 1.4 cm (series 5, image
54). Occasional tiny pulmonary nodules are stable and definitively
benign, for example a 0.3 cm nodule of the left apex (series 5,
image 33). Minimal irregular scarring and ground-glass in the
dependent right lung base.

Upper Abdomen: No acute abnormality.

Musculoskeletal: No chest wall mass or suspicious bone lesions
identified.
IMPRESSION: 1. Small, chronic, loculated left pleural effusion with associated
smooth pleural thickening.
2. Bandlike scarring, atelectasis, and subpleural rounded
atelectasis of the left lower lobe and lingula.
3. There is a somewhat ill-defined irregular nodular consolidation
of the suprahilar left upper lobe, measuring approximately 2.0 x
cm. This may reflect an additional focus of scarring but is
worrisome in morphology and location for malignancy. At minimum,
recommend short interval follow-up in 3 months to assess for
stability. PET-CT can additionally be considered for metabolic
characterization.
4. Occasional tiny pulmonary nodules are stable and definitively
benign. No further follow-up or characterization is specifically
required for these nodules.
5. Coronary artery disease.
6. Small pericardial effusion.

These results will be called to the ordering clinician or
representative by the Radiologist Assistant, and communication
documented in the PACS or [REDACTED].

Aortic Atherosclerosis ([FN]-[FN]).

## 2021-01-06 ENCOUNTER — Telehealth: Payer: Self-pay

## 2021-01-06 ENCOUNTER — Other Ambulatory Visit: Payer: Self-pay | Admitting: Family Medicine

## 2021-01-06 ENCOUNTER — Other Ambulatory Visit: Payer: Self-pay | Admitting: Cardiovascular Disease

## 2021-01-06 MED ORDER — MOLNUPIRAVIR EUA 200MG CAPSULE: 4.0000 | ORAL_CAPSULE | Freq: Two times a day (BID) | ORAL | 0 refills | Status: AC

## 2021-01-06 NOTE — Telephone Encounter (Signed)
Patients wife called she has tested positive for covid and [name] is still testing negative.... she states he's complaining of headache and sneezing and is requesting a preventative medication due to patients kidney's

## 2021-01-06 NOTE — Telephone Encounter (Signed)
Patient aware.

## 2021-01-09 ENCOUNTER — Encounter: Payer: Self-pay | Admitting: Family Medicine

## 2021-01-13 ENCOUNTER — Other Ambulatory Visit: Payer: Self-pay | Admitting: Cardiovascular Disease

## 2021-01-18 ENCOUNTER — Encounter: Payer: Self-pay | Admitting: Pulmonary Disease

## 2021-01-18 ENCOUNTER — Ambulatory Visit: Payer: PPO | Admitting: Pulmonary Disease

## 2021-01-18 ENCOUNTER — Other Ambulatory Visit: Payer: Self-pay

## 2021-01-18 VITALS — BP 120/64 | HR 62 | Temp 97.9°F | Ht 66.0 in | Wt 175.6 lb

## 2021-01-18 DIAGNOSIS — R911 Solitary pulmonary nodule: Secondary | ICD-10-CM | POA: Diagnosis not present

## 2021-01-18 NOTE — Progress Notes (Signed)
@Patient  ID: [name], male    DOB: 1933/08/31, 87 y.o.   MRN: [id number]  Chief Complaint  Patient presents with   Follow-up    Follow up for a spot that was found on the lungs. Breathing is fine per patient but does have some SOB.     Referring provider: [name], MD  HPI:   87 y.o. admitted after fall with broken ribs 07/2019 then readmitted couple weeks later with febrile illness treated for pneumonia with associated effusion status post drainage 2 months later 10/06/2019 exudate by protein number seen in follow-up pleural effusion. Repeat thora 11/2019 with similar results. Cytology negative x 2. New consult today for evaluation of left lung nodule. PCP note x 2 reviewed.  Overall he feels well.  DOE better, essentially resolved. Ongoing issue with falls. Treated for pneumonia 12/2020. Reported seerak days of cough, fever, arthralgias, and cough. Tested negative for covid at home. Tested negative for flu at doctors office. CXR obtained 12/15/20 that on my review and interpretation reveals patchy left-sided airspace disease particularly left midlung field and left lateral lung field abutting what appears to be a pleural or lateral border of the lung concerning for possible pneumonia and recurrent although improved left-sided effusion.  He was treated with antibiotics.  Looks like CT scan was ordered to follow-up of pleural effusion.  This revealed rounded atelectasis on the left as well as a left upper lobe nodule about 2 cm on my review and interpretation.  This prompted referral.  Questionaires / Pulmonary Flowsheets:   ACT:  No flowsheet data found.  MMRC: mMRC Dyspnea Scale mMRC Score  01/25/2020 1    Epworth:  No flowsheet data found.  Tests:   FENO:  No results found for: NITRICOXIDE  PFT: No flowsheet data found.  WALK:  No flowsheet data found.  Imaging: Personally reviewed CT Chest Wo Contrast  Result Date: 01/06/2021 CLINICAL DATA:   Evaluate for pleural effusion EXAM: CT CHEST WITHOUT CONTRAST TECHNIQUE: Multidetector CT imaging of the chest was performed following the standard protocol without IV contrast. COMPARISON:  Chest radiographs, 12/15/2020, CT chest, 07/14/2019 FINDINGS: Cardiovascular: Aortic atherosclerosis. Normal heart size. Extensive 3 vessel coronary artery calcifications and/or stents. Small pericardial effusion. Mediastinum/Nodes: No enlarged mediastinal, hilar, or axillary lymph nodes. Thyroid gland, trachea, and esophagus demonstrate no significant findings. Lungs/Pleura: Small, chronic, loculated left pleural effusion with associated smooth pleural thickening. Bandlike scarring, atelectasis, and subpleural rounded atelectasis of the left lower lobe and lingula (series 5, image 83). There is a somewhat ill-defined irregular nodular consolidation of the suprahilar left upper lobe, measuring approximately 2.0 x 1.4 cm (series 5, image 54). Occasional tiny pulmonary nodules are stable and definitively benign, for example a 0.3 cm nodule of the left apex (series 5, image 33). Minimal irregular scarring and ground-glass in the dependent right lung base. Upper Abdomen: No acute abnormality. Musculoskeletal: No chest wall mass or suspicious bone lesions identified. IMPRESSION: 1. Small, chronic, loculated left pleural effusion with associated smooth pleural thickening. 2. Bandlike scarring, atelectasis, and subpleural rounded atelectasis of the left lower lobe and lingula. 3. There is a somewhat ill-defined irregular nodular consolidation of the suprahilar left upper lobe, measuring approximately 2.0 x 1.4 cm. This may reflect an additional focus of scarring but is worrisome in morphology and location for malignancy. At minimum, recommend short interval follow-up in 3 months to assess for stability. PET-CT can additionally be considered for metabolic characterization. 4. Occasional tiny pulmonary nodules are stable and  definitively  benign. No further follow-up or characterization is specifically required for these nodules. 5. Coronary artery disease. 6. Small pericardial effusion. These results will be called to the ordering clinician or representative by the Radiologist Assistant, and communication documented in the PACS or [employer]. Aortic Atherosclerosis (ICD10-I70.0). Electronically Signed   By: Delanna Ahmadi M.D.   On: 01/06/2021 16:02     Lab Results: Personally reviewed CBC    Component Value Date/Time   WBC 5.1 11/18/2020 0956   RBC 3.73 (L) 11/18/2020 0956   HGB 12.2 (L) 11/18/2020 0956   HCT 35.6 (L) 11/18/2020 0956   PLT 206 11/18/2020 0956   MCV 95.4 11/18/2020 0956   MCV 100.0 (A) 09/28/2012 0846   MCH 32.7 11/18/2020 0956   MCHC 34.3 11/18/2020 0956   RDW 12.1 11/18/2020 0956   LYMPHSABS 678 (L) 11/18/2020 0956   MONOABS 0.5 09/28/2020 1444   EOSABS 291 11/18/2020 0956   BASOSABS 20 11/18/2020 0956    BMET    Component Value Date/Time   NA 141 11/18/2020 0956   NA 139 01/05/2020 1524   K 4.3 11/18/2020 0956   CL 104 11/18/2020 0956   CO2 30 11/18/2020 0956   GLUCOSE 93 11/18/2020 0956   BUN 34 (H) 11/18/2020 0956   BUN 25 01/05/2020 1524   CREATININE 1.78 (H) 11/18/2020 0956   CALCIUM 9.5 11/18/2020 0956   GFRNONAA 36 (L) 09/28/2020 1444   GFRNONAA 47 (L) 01/19/2020 1621   GFRAA 54 (L) 01/19/2020 1621    BNP    Component Value Date/Time   BNP 105.9 (H) 11/01/2019 1250    ProBNP    Component Value Date/Time   PROBNP 45.0 07/29/2006 1440    Specialty Problems   None  Allergies  Allergen Reactions   Gabapentin Other (See Comments)    Visual changes and confusion- "I went blind while I was driving"    Immunization History  Administered Date(s) Administered   Fluad Quad(high Dose 65+) 11/10/2020   Influenza Split 10/06/2012   Influenza, High Dose Seasonal PF 12/02/2016, 10/26/2018, 10/27/2018, 10/23/2019   Influenza,inj,Quad PF,6+ Mos 11/12/2013, 11/11/2014,  10/20/2015, 11/12/2017   Moderna Sars-Covid-2 Vaccination 03/04/2019, 04/03/2019   Pfizer Covid-19 Vaccine Bivalent Booster 6yrs & up 11/10/2020   Pneumococcal Conjugate-13 03/17/2013   Pneumococcal Polysaccharide-23 02/05/2001   Td 03/01/2009   Zoster, Live 02/25/2008    Past Medical History:  Diagnosis Date   Acute blood loss anemia    Acute upper respiratory infections of unspecified site 11/04/2012   AKI (acute kidney injury)    Benign prostatic hyperplasia    CAD (coronary artery disease)    s/p cypher DES to pLAD 6/08; normal LVF;  ETT-Myoview 2009: no ischemia    Clavicle fracture 08/10/2019   Closed displaced fracture of phalanx of left thumb, sequela 08/10/2019   Coronary atherosclerosis of native coronary artery 03/08/2008   Eosinophilia    Essential hypertension    Fracture of multiple ribs with pain 08/10/2019   History of multiple falls    History of SAH (subarachnoid hemorrhage) 07/14/2019   Hyperlipidemia type IIB / III 03/08/2008   Major neurocognitive disorder due to possible Alzheimer's disease, without behavioral disturbance 02/29/2020   MI (myocardial infarction)    Moderate persistent asthma    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   Imaging revealed SAH   Nocturnal leg cramps 11/23/2012   Orthostatic hypotension 03/08/2008   Pain in joint, shoulder region 11/23/2012   Stage 3b chronic  kidney disease    Thrombocytopenia    Trigeminal neuralgia    Type 2 diabetes mellitus     Tobacco History: Social History   Tobacco Use  Smoking Status Never  Smokeless Tobacco Never   Counseling given: Not Answered   Continue to not smoke  Outpatient Encounter Medications as of 01/18/2021  Medication Sig   albuterol (PROAIR HFA) 108 (90 Base) MCG/ACT inhaler INHALE 2 PUFFS INTO THE LUNGS EVERY 6 HOURS AS NEEDED FOR WHEEZING OR SHORTNESS OF BREATH (Patient taking differently: Inhale 2 puffs into the lungs every 6 (six) hours  as needed for wheezing or shortness of breath.)   amLODipine (NORVASC) 5 MG tablet Take 1 tablet (5 mg total) by mouth daily. Please make yearly appt with Dr. [name] for November 2022 for future refills. Thank you 1st attempt   Apoaequorin (PREVAGEN EXTRA STRENGTH PO) Take 1 tablet by mouth daily.   atorvastatin (LIPITOR) 20 MG tablet Take 1 tablet (20 mg total) by mouth daily.   Calcium Carb-Cholecalciferol (CALCIUM 600+D3 PO) Take 1 tablet by mouth 2 (two) times daily.   doxazosin (CARDURA) 2 MG tablet TAKE 1 TABLET BY MOUTH EVERY DAY   escitalopram (LEXAPRO) 10 MG tablet Take 1 tablet (10 mg total) by mouth daily.   ezetimibe (ZETIA) 10 MG tablet Take 1 tablet (10 mg total) by mouth daily. Please make overdue appt with Dr. [name] before anymore refills. Thank you 2nd attempt   fluticasone furoate-vilanterol (BREO ELLIPTA) 200-25 MCG/ACT AEPB INHALE 1 PUFF DAILY   lisinopril (ZESTRIL) 10 MG tablet Take 1 tablet (10 mg total) by mouth daily.   loratadine (CLARITIN) 10 MG tablet Take 10 mg by mouth daily as needed for allergies or rhinitis.    meclizine (ANTIVERT) 12.5 MG tablet Take 1 tablet (12.5 mg total) by mouth 3 (three) times daily as needed for dizziness. (Patient not taking: Reported on 01/18/2021)   Multiple Vitamin (MULTIVITAMIN WITH MINERALS) TABS tablet Take 1 tablet by mouth daily.   ondansetron (ZOFRAN ODT) 8 MG disintegrating tablet Take 1 tablet (8 mg total) by mouth every 8 (eight) hours as needed for nausea or vomiting. (Patient not taking: Reported on 01/18/2021)   rivastigmine (EXELON) 1.5 MG capsule Take 1 capsule daily for 1 week, then increase to 1 capsule twice a day (Patient not taking: Reported on 01/18/2021)   terbinafine (LAMISIL) 250 MG tablet Take 1 tablet (250 mg total) by mouth daily. (Patient not taking: Reported on 01/18/2021)   [DISCONTINUED] amoxicillin-clavulanate (AUGMENTIN) 875-125 MG tablet Take 1 tablet by mouth 2 (two) times daily.   No  facility-administered encounter medications on file as of 01/18/2021.       Physical Exam  BP 120/64 (BP Location: Left Arm, Patient Position: Sitting, Cuff Size: Normal)    Pulse 62    Temp 97.9 F (36.6 C) (Oral)    Ht 5\' 6"  (1.676 m)    Wt 175 lb 9.6 oz (79.7 kg)    SpO2 99%    BMI 28.34 kg/m   Wt Readings from Last 5 Encounters:  01/18/21 175 lb 9.6 oz (79.7 kg)  11/18/20 174 lb (78.9 kg)  09/24/20 180 lb (81.6 kg)  09/09/20 176 lb 9.6 oz (80.1 kg)  08/22/20 176 lb (79.8 kg)    BMI Readings from Last 5 Encounters:  01/18/21 28.34 kg/m  11/18/20 27.25 kg/m  09/24/20 29.05 kg/m  09/09/20 27.25 kg/m  08/22/20 27.16 kg/m     Physical Exam  General: Well-appearing, no acute distress Respiratory:  Normal work of breathing, on room air, Diminished L base compared to R. Otherwise CTAB Extremities: Warm, no edema present  Assessment & Plan:   Lung nodule: 2 cm left upper lobe nodule.  In the setting of what sounds like clinical pneumonia, possibly viral given myalgias etc. although he tested negative for flu and COVID 12/2020.  He is a never smoker.  Some concern for cancer given the appearance.  We discussed at length the options for further evaluation.  Given this was obtained only a couple weeks after recovering from diagnosed pneumonia, likely represents ongoing healing and residual effects from the pneumonia.  Could pursue PET scan at this time but could get a false positive that area of inflammation is just resolving pneumonia.  Discussed recommendation to repeat a CT scan in 4 to 6 weeks to evaluate change.  Additional recommendation per radiologist to consider waiting 3 months although given the size and recent ammonia would prefer shorter follow-up.  However, he is eager to find out if this is cancer or not.  As such we will pursue PET scan.  Discussed potential biopsy modalities if needed in the future. --PET scan ordered  Recurrent left pleural effusion: Status post  thoracentesis late August 2021.  Studies consistent with exudate via protein.  Lymphocytic predominance.  Unclear etiology.  Burtis Junes most likely related to postinflammatory changes after fall on left side versus what is more likely parapneumonic effusion after febrile illness treated with antibiotics 08/2019.  Serial chest x-rays have showed that the fluid has reaccumulated although not to the level as it was prior but certainly appears larger than the post thoracentesis chest x-ray.  Repeat thoracentesis in the office 11/20/19 similar fluid studies  Simple, hypoechoic appearing fluid on ultrasound. Cytology negative x 2. Recent falls, memory loss. Work up continues. Will prioritize this work up and defer further work up of pleural effusion for now especially as breathing is improved and remains so.    Dyspnea on exertion: Likely related to pleural effusion. Much improved today, no further work up for now.    Return in about 4 weeks (around 02/15/2021).   Lanier Clam, MD 01/18/2021  I spent 45 minutes in care of the patient including face-to-face visit, review of records, coordination of care.

## 2021-01-18 NOTE — Patient Instructions (Signed)
Nice to see you again  We will get a PET scan to further evaluate the spot on the left lung  Based on the results we will discuss next steps  Return to clinic in 4 weeks with Dr. Silas Flood or sooner as needed

## 2021-01-19 ENCOUNTER — Telehealth: Payer: Self-pay | Admitting: Pharmacist

## 2021-01-19 NOTE — Progress Notes (Addendum)
Chronic Care Management Pharmacy Assistant   Name: [name]  MRN: [id number] DOB: [birth year]   Reason for Encounter: Disease State - General Adherence Call     Recent office visits:  12/12/20 [name] Luo, MD (PCP) - Family Medicine - Flu test done in office. Follow up as scheduled.   12/05/20 [name] Luo, MD (PCP) - Family Medicine - Hypertension - No notes available.   Recent consult visits:  None noted.   Hospital visits: 09/28/20 Medication Reconciliation was completed by comparing discharge summary, patients EMR and Pharmacy list, and upon discussion with patient.  Admitted to the hospital on 09/28/20 due to Nausea and vomiting. Discharge date was 09/28/20. Discharged from [place]?Medications Started at [facility] Discharge:?? meclizine (ANTIVERT) 12.5 MG tablet and ondansetron (ZOFRAN ODT) 8 MG disintegrating tablet prescribed.  Medication Changes at Hospital Discharge: None noted.   Medications Discontinued at Hospital Discharge: None noted  Medications that remain the same after Hospital Discharge:??  All other medications will remain the same.    Hospital visits: 09/24/20 Medication Reconciliation was completed by comparing discharge summary, patients EMR and Pharmacy list, and upon discussion with patient.  Admitted to the hospital on 09/24/20 due to Fall. Discharge date was 09/24/20. Discharged from [place]?Medications Started at [facility] Discharge:?? None noted.   Medication Changes at Hospital Discharge: None noted.   Medications Discontinued at Hospital Discharge: None noted.  Medications that remain the same after Hospital Discharge:??  All other medications will remain the same.  Hospital visits: 08/31/20 Medication Reconciliation was completed by comparing discharge summary, patients EMR and Pharmacy list, and upon discussion with patient.  Admitted to the hospital on 08/31/20 due to Fall.  Discharge date was 09/01/20. Discharged from [place]?Medications Started at [facility] Discharge:?? None noted.   Medication Changes at Hospital Discharge: None noted.   Medications Discontinued at Hospital Discharge: None noted.   Medications that remain the same after Hospital Discharge:??  All other medications will remain the same.  Medications: Outpatient Encounter Medications as of 01/19/2021  Medication Sig   albuterol (PROAIR HFA) 108 (90 Base) MCG/ACT inhaler INHALE 2 PUFFS INTO THE LUNGS EVERY 6 HOURS AS NEEDED FOR WHEEZING OR SHORTNESS OF BREATH (Patient taking differently: Inhale 2 puffs into the lungs every 6 (six) hours as needed for wheezing or shortness of breath.)   amLODipine (NORVASC) 5 MG tablet Take 1 tablet (5 mg total) by mouth daily. Please make yearly appt with Dr. [name] for November 2022 for future refills. Thank you 1st attempt   Apoaequorin (PREVAGEN EXTRA STRENGTH PO) Take 1 tablet by mouth daily.   atorvastatin (LIPITOR) 20 MG tablet Take 1 tablet (20 mg total) by mouth daily.   Calcium Carb-Cholecalciferol (CALCIUM 600+D3 PO) Take 1 tablet by mouth 2 (two) times daily.   doxazosin (CARDURA) 2 MG tablet TAKE 1 TABLET BY MOUTH EVERY DAY   escitalopram (LEXAPRO) 10 MG tablet Take 1 tablet (10 mg total) by mouth daily.   ezetimibe (ZETIA) 10 MG tablet Take 1 tablet (10 mg total) by mouth daily. Please make overdue appt with Dr. [name] before anymore refills. Thank you 2nd attempt   fluticasone furoate-vilanterol (BREO ELLIPTA) 200-25 MCG/ACT AEPB INHALE 1 PUFF DAILY   lisinopril (ZESTRIL) 10 MG tablet Take 1 tablet (10 mg total) by mouth daily.   loratadine (CLARITIN) 10 MG tablet Take 10 mg by mouth daily as needed for allergies or rhinitis.  meclizine (ANTIVERT) 12.5 MG tablet Take 1 tablet (12.5 mg total) by mouth 3 (three) times daily as needed for dizziness. (Patient not taking: Reported on 01/18/2021)   Multiple Vitamin (MULTIVITAMIN  WITH MINERALS) TABS tablet Take 1 tablet by mouth daily.   ondansetron (ZOFRAN ODT) 8 MG disintegrating tablet Take 1 tablet (8 mg total) by mouth every 8 (eight) hours as needed for nausea or vomiting. (Patient not taking: Reported on 01/18/2021)   rivastigmine (EXELON) 1.5 MG capsule Take 1 capsule daily for 1 week, then increase to 1 capsule twice a day (Patient not taking: Reported on 01/18/2021)   terbinafine (LAMISIL) 250 MG tablet Take 1 tablet (250 mg total) by mouth daily. (Patient not taking: Reported on 01/18/2021)   No facility-administered encounter medications on file as of 01/19/2021.    Have you had any problems recently with your health? Patient denied any recent problems with his health recently.   Have you had any problems with your pharmacy? Patient denied any problems with current pharmacy.  What issues or side effects are you having with your medications? Patient denied any issues or side effects with his current medications.   What would you like me to pass along to Leata Mouse, CPP for them to help you with?  Having to pay $95.00 for Breo instead of $45.00 due to being in the doughnut hole. Have sent in PAP and awaiting outcome. Patient's wife also states they will be following up with Neurologist in few weeks to discuss going back on some medication for memory. Has 4 week follow up scheduled for 02/15/21.  What can we do to take care of you better? Patient and wife did not have any other recommendations at this time. They thanked me for the follow up.   Care Gaps  AWV: done 05/30/20 Colonoscopy: done 04/16/14 DM Eye Exam: N/A DM Foot Exam:  N/A Microalbumin: done 11/18/20 HbgAIC: done 11/18/20 (5.5) DEXA: N/A Mammogram: N/A  Star Rating Drugs: lisinopril (ZESTRIL) 10 MG tablet - last filled 11/25/20 90 days  atorvastatin (LIPITOR) 20 MG tablet - last filled 11/17/20 90 days   Future Appointments  Date Time Provider [place]  02/15/2021  3:30 PM  Hunsucker, Bonna Gains, MD [facility] None  03/03/2021  8:30 AM Hazle Coca, PhD [facility] None  03/03/2021  9:30 AM [facility]- NEUROPSYCH TECH [facility] None  03/13/2021 10:00 AM Hazle Coca, PhD [facility] None  03/22/2021  9:00 AM Rondel Jumbo, PA-C [facility] None  04/20/2021  3:00 PM [facility] PHARMACIST [facility] None    Jobe Gibbon, [place] Clinical Pharmacist Assistant  [phone]

## 2021-01-24 ENCOUNTER — Encounter: Payer: Self-pay | Admitting: Neurology

## 2021-02-02 ENCOUNTER — Other Ambulatory Visit: Payer: Self-pay

## 2021-02-02 ENCOUNTER — Ambulatory Visit (HOSPITAL_COMMUNITY)
Admission: RE | Admit: 2021-02-02 | Discharge: 2021-02-02 | Disposition: A | Discharge status: Home / Self Care | Payer: PPO | Source: Ambulatory Visit | Attending: Pulmonary Disease | Admitting: Pulmonary Disease

## 2021-02-02 DIAGNOSIS — I700 Atherosclerosis of aorta: Secondary | ICD-10-CM | POA: Insufficient documentation

## 2021-02-02 DIAGNOSIS — I2510 Atherosclerotic heart disease of native coronary artery without angina pectoris: Secondary | ICD-10-CM | POA: Diagnosis not present

## 2021-02-02 DIAGNOSIS — J90 Pleural effusion, not elsewhere classified: Secondary | ICD-10-CM | POA: Insufficient documentation

## 2021-02-02 DIAGNOSIS — J9811 Atelectasis: Secondary | ICD-10-CM | POA: Diagnosis not present

## 2021-02-02 DIAGNOSIS — R911 Solitary pulmonary nodule: Secondary | ICD-10-CM | POA: Diagnosis not present

## 2021-02-02 DIAGNOSIS — I7143 Infrarenal abdominal aortic aneurysm, without rupture: Secondary | ICD-10-CM | POA: Insufficient documentation

## 2021-02-02 LAB — GLUCOSE, CAPILLARY: Glucose-Capillary: 90 mg/dL (ref 70–99)

## 2021-02-02 MED ORDER — FLUDEOXYGLUCOSE F - 18 (FDG) INJECTION
8.7300 | Freq: Once | INTRAVENOUS | Status: AC | PRN
  Administered 2021-02-02: 09:00:00 8.73 via INTRAVENOUS

## 2021-02-07 NOTE — Progress Notes (Signed)
Spoke with patient and wife regarding recent PET scan results.  Overall great news with resolution of prior left upper lobe nodule.  Discussed incidental findings of known pancreatic cysts and reiterated recommendation for follow-up MRI in the next 6 months.  They are to coordinate with Dr. Dennard Schaumann, PCP, office.  In addition, there is an abdominal aneurysm which they state has been followed for many years and they are aware of ongoing surveillance for this as well.  We will cancel upcoming appointment next week with me and reschedule for follow-up in 6 months.    April, do you mind putting in a recall for follow-up visit in 6 months and canceling office visit next week?  Thank you exhalation point

## 2021-02-12 ENCOUNTER — Other Ambulatory Visit: Payer: Self-pay | Admitting: Family Medicine

## 2021-02-13 ENCOUNTER — Emergency Department (HOSPITAL_BASED_OUTPATIENT_CLINIC_OR_DEPARTMENT_OTHER)
Admission: EM | Admit: 2021-02-13 | Discharge: 2021-02-13 | Disposition: A | Discharge status: Home / Self Care | Payer: PPO | Attending: Emergency Medicine | Admitting: Emergency Medicine

## 2021-02-13 ENCOUNTER — Other Ambulatory Visit: Payer: Self-pay | Admitting: Cardiovascular Disease

## 2021-02-13 ENCOUNTER — Other Ambulatory Visit: Payer: Self-pay

## 2021-02-13 ENCOUNTER — Encounter (HOSPITAL_BASED_OUTPATIENT_CLINIC_OR_DEPARTMENT_OTHER): Payer: Self-pay | Admitting: Emergency Medicine

## 2021-02-13 DIAGNOSIS — S0991XA Unspecified injury of ear, initial encounter: Secondary | ICD-10-CM | POA: Diagnosis not present

## 2021-02-13 DIAGNOSIS — I2510 Atherosclerotic heart disease of native coronary artery without angina pectoris: Secondary | ICD-10-CM | POA: Insufficient documentation

## 2021-02-13 DIAGNOSIS — E1122 Type 2 diabetes mellitus with diabetic chronic kidney disease: Secondary | ICD-10-CM | POA: Diagnosis not present

## 2021-02-13 DIAGNOSIS — W19XXXA Unspecified fall, initial encounter: Secondary | ICD-10-CM | POA: Insufficient documentation

## 2021-02-13 DIAGNOSIS — H6122 Impacted cerumen, left ear: Secondary | ICD-10-CM | POA: Insufficient documentation

## 2021-02-13 DIAGNOSIS — H9222 Otorrhagia, left ear: Secondary | ICD-10-CM

## 2021-02-13 DIAGNOSIS — G309 Alzheimer's disease, unspecified: Secondary | ICD-10-CM | POA: Diagnosis not present

## 2021-02-13 DIAGNOSIS — Z79899 Other long term (current) drug therapy: Secondary | ICD-10-CM | POA: Insufficient documentation

## 2021-02-13 DIAGNOSIS — N189 Chronic kidney disease, unspecified: Secondary | ICD-10-CM | POA: Insufficient documentation

## 2021-02-13 NOTE — ED Triage Notes (Signed)
Pt presents from home for dried blood in L ear; family concerned for patient possibly having fallen, recently hit head getting up from toilet on Thursday as well. H/o dementia with frequent falls, h/o subdural hematoma.  No blood thinners. Denies N/V, fever. Family notes recent ear complaints

## 2021-02-13 NOTE — ED Provider Notes (Signed)
[facility] Provider Note   CSN: [id number] Arrival date & time: 02/13/21  0559     History  Chief Complaint  Patient presents with   [name] is a 88 y.o. male.  HPI     This is an 88 year old male with a history of coronary artery disease, diabetes, chronic kidney disease, Alzheimer's who presents with bleeding from his left ear.  Patient did have a minor fall on Thursday.  He hit the front side of his forehead.  Did not sustain any major injury and he is not on any blood thinners.  This morning he woke up and noted that his left ear was bleeding.  No significant pain this morning but he has had pain and itching in that ear.  Wife states that he itches it from both ears and frequently puts his fingers in his ears.  He has not had any recent illnesses or fevers.   Home Medications Prior to Admission medications   Medication Sig Start Date End Date Taking? Authorizing Provider  albuterol (PROAIR HFA) 108 (90 Base) MCG/ACT inhaler INHALE 2 PUFFS INTO THE LUNGS EVERY 6 HOURS AS NEEDED FOR WHEEZING OR SHORTNESS OF BREATH Patient taking differently: Inhale 2 puffs into the lungs every 6 (six) hours as needed for wheezing or shortness of breath. 08/06/19   Angiulli, Lavon Paganini, PA-C  amLODipine (NORVASC) 5 MG tablet Take 1 tablet (5 mg total) by mouth daily. Please make yearly appt with Dr. [name] for November 2022 for future refills. Thank you 1st attempt 11/17/20   Sherren Mocha, MD  Apoaequorin [facility] EXTRA STRENGTH PO) Take 1 tablet by mouth daily.    [provider]  atorvastatin (LIPITOR) 20 MG tablet Take 1 tablet (20 mg total) by mouth daily. 08/22/20   [name], MD  Calcium Carb-Cholecalciferol (CALCIUM 600+D3 PO) Take 1 tablet by mouth 2 (two) times daily.    [provider]  doxazosin (CARDURA) 2 MG tablet TAKE 1 TABLET BY MOUTH EVERY DAY 12/26/20   [name], MD  escitalopram (LEXAPRO) 10 MG  tablet Take 1 tablet (10 mg total) by mouth daily. 07/14/20   [name], MD  ezetimibe (ZETIA) 10 MG tablet Take 1 tablet (10 mg total) by mouth daily. Please make overdue appt with Dr. [name] before anymore refills. Thank you 2nd attempt 01/06/21   Sherren Mocha, MD  fluticasone furoate-vilanterol [facility] ELLIPTA) 200-25 MCG/ACT AEPB INHALE 1 PUFF DAILY 12/14/20   [name], MD  lisinopril (ZESTRIL) 10 MG tablet Take 1 tablet (10 mg total) by mouth daily. 11/24/20   [name], MD  loratadine (CLARITIN) 10 MG tablet Take 10 mg by mouth daily as needed for allergies or rhinitis.     [provider]  meclizine (ANTIVERT) 12.5 MG tablet Take 1 tablet (12.5 mg total) by mouth 3 (three) times daily as needed for dizziness. Patient not taking: Reported on 01/18/2021 09/28/20   Lajean Saver, MD  Multiple Vitamin (MULTIVITAMIN WITH MINERALS) TABS tablet Take 1 tablet by mouth daily.    [provider]  ondansetron (ZOFRAN ODT) 8 MG disintegrating tablet Take 1 tablet (8 mg total) by mouth every 8 (eight) hours as needed for nausea or vomiting. Patient not taking: Reported on 01/18/2021 09/28/20   Lajean Saver, MD  rivastigmine (EXELON) 1.5 MG capsule Take 1 capsule daily for 1 week, then increase to 1 capsule twice a day Patient not taking: Reported on 01/18/2021  07/08/20   Cameron Sprang, MD  terbinafine (LAMISIL) 250 MG tablet Take 1 tablet (250 mg total) by mouth daily. Patient not taking: Reported on 01/18/2021 08/22/20   [name], MD      Allergies    Gabapentin    Review of Systems   Review of Systems  Constitutional:  Negative for fever.  HENT:  Positive for ear discharge. Negative for congestion and ear pain.   Neurological:  Negative for headaches.  All other systems reviewed and are negative.  Physical Exam Updated Vital Signs BP (!) 167/69    Pulse 78    Temp 97.7 F (36.5 C) (Oral)    Resp 20    Ht 1.689 m (5' 6.5")    Wt 78.9 kg    SpO2  99%    BMI 27.66 kg/m  Physical Exam Vitals and nursing note reviewed.  Constitutional:      Appearance: He is well-developed. He is not ill-appearing.  HENT:     Head: Normocephalic and atraumatic.     Ears:     Comments: Debris and clot noted in the left ear canal, initially unable to appreciate tympanic membrane Right TM with partial occlusion from what appears to be skin, TM clear and intact    Nose: No congestion.     Mouth/Throat:     Mouth: Mucous membranes are moist.  Eyes:     Pupils: Pupils are equal, round, and reactive to light.  Cardiovascular:     Rate and Rhythm: Normal rate and regular rhythm.  Pulmonary:     Effort: Pulmonary effort is normal. No respiratory distress.  Abdominal:     Palpations: Abdomen is soft.  Musculoskeletal:        General: No deformity.     Cervical back: Neck supple.  Lymphadenopathy:     Cervical: No cervical adenopathy.  Skin:    General: Skin is warm and dry.  Neurological:     Mental Status: He is alert and oriented to person, place, and time.  Psychiatric:        Mood and Affect: Mood normal.    ED Results / Procedures / Treatments   Labs (all labs ordered are listed, but only abnormal results are displayed) Labs Reviewed - No data to display  EKG None  Radiology No results found.  Procedures .Ear Cerumen Removal  Date/Time: 02/13/2021 6:37 AM Performed by: Merryl Hacker, MD Authorized by: Merryl Hacker, MD   Consent:    Consent obtained:  Verbal   Consent given by:  Patient and spouse   Risks, benefits, and alternatives were discussed: yes     Risks discussed:  Bleeding, infection, pain and TM perforation   Alternatives discussed:  No treatment Procedure details:    Location:  L ear   Procedure type: irrigation     Successful cerumen removal: Dead skin and clot removed.   Post-procedure details:    Inspection:  Ear canal clear and TM intact   Procedure completion:  Tolerated    Medications  Ordered in ED Medications - No data to display  ED Course/ Medical Decision Making/ A&P                           Medical Decision Making  This patient presents to the ED for concern of bleeding ear, this involves an extensive number of treatment options, and is a complaint that carries with it a high risk of  complications and morbidity.  The differential diagnosis includes trauma, ruptured tympanic membrane, external auditory canal damage.  MDM:    This is an 88 year old male who presents with left ear bleeding.  He is nontoxic and vital signs reassuring.  He is able to provide history although he does have a history of Alzheimer's.  Does report a minor fall on Thursday but did not have any noted injury at that time and is not on any blood thinners.  Physical exam shows blood Limited to the external auditory canal of the left ear.  Upon further expression and evacuation of what appears to be dead skin cells, there was clot adhered to the wall.  No obvious traumatic injury.  He does have itchy ears and puts his fingers in his ear frequently.  Question whether this is eczema or dermatitis and the bleeding may be related to skin sloughing.  No other signs of trauma.  Ear was irrigated and I was able to visualize TM.  It was intact.  Do not feel this is related to his fall on Thursday.  We did discuss risk and benefits of CT imaging.  I have opted not to undergo CT imaging given likely low yield and given that the fall was likely not related to the current complaint.  ENT follow-up provided. (Labs, imaging)  Labs: I Ordered, and personally interpreted labs.  The pertinent results include: None   Imaging Studies ordered: I ordered imaging studies including none I independently visualized and interpreted imaging. I agree with the radiologist interpretation  Critical Interventions: Debris removal from left ear canal   Consultations Obtained: I requested consultation with none,  and discussed  lab and imaging findings as well as pertinent plan - they recommend: None  Reevaluation: After the interventions noted above, I reevaluated the patient and found that they have :improved  Social Determinants of Health: Lives with wife, dementia  Disposition: Home with ENT follow-up  Co morbidities that complicate the patient evaluation  Past Medical History:  Diagnosis Date   Acute blood loss anemia    Acute upper respiratory infections of unspecified site 11/04/2012   AKI (acute kidney injury)    Benign prostatic hyperplasia    CAD (coronary artery disease)    s/p cypher DES to pLAD 6/08; normal LVF;  ETT-Myoview 2009: no ischemia    Clavicle fracture 08/10/2019   Closed displaced fracture of phalanx of left thumb, sequela 08/10/2019   Coronary atherosclerosis of native coronary artery 03/08/2008   Eosinophilia    Essential hypertension    Fracture of multiple ribs with pain 08/10/2019   History of multiple falls    History of SAH (subarachnoid hemorrhage) 07/14/2019   Hyperlipidemia type IIB / III 03/08/2008   Major neurocognitive disorder due to possible Alzheimer's disease, without behavioral disturbance 02/29/2020   MI (myocardial infarction)    Moderate persistent asthma    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  I personally viewed and interpreted the cardiac monitored which showed an underlying rhythm of: N/A  Medicines  No orders of the defined types were placed in this encounter.    I have  reviewed the patients home medicines and have made adjustments as needed   Problem List / ED Course: Problem List Items Addressed This Visit   None Visit Diagnoses     Ear bleeding, left    -  Primary                   Final Clinical Impression(s) / ED Diagnoses Final diagnoses:  Ear bleeding, left    Rx / DC Orders ED Discharge Orders     None         Merryl Hacker, MD 02/13/21 [phone]

## 2021-02-13 NOTE — Discharge Instructions (Signed)
You were seen today for bleeding from your ear canal.  This may be related to the peeling of the skin within your ear canal.  This can sometimes be related to dermatitis or eczema.  Follow-up with ENT.

## 2021-02-15 ENCOUNTER — Ambulatory Visit: Payer: PPO | Admitting: Pulmonary Disease

## 2021-02-16 DIAGNOSIS — Z7951 Long term (current) use of inhaled steroids: Secondary | ICD-10-CM | POA: Diagnosis not present

## 2021-02-16 DIAGNOSIS — F0390 Unspecified dementia without behavioral disturbance: Secondary | ICD-10-CM | POA: Diagnosis not present

## 2021-02-16 DIAGNOSIS — J449 Chronic obstructive pulmonary disease, unspecified: Secondary | ICD-10-CM | POA: Diagnosis not present

## 2021-02-20 ENCOUNTER — Other Ambulatory Visit: Payer: Self-pay | Admitting: Family Medicine

## 2021-02-22 ENCOUNTER — Other Ambulatory Visit: Payer: Self-pay | Admitting: Cardiovascular Disease

## 2021-02-27 ENCOUNTER — Encounter: Payer: Self-pay | Admitting: Neurology

## 2021-03-01 ENCOUNTER — Other Ambulatory Visit: Payer: Self-pay | Admitting: Neurology

## 2021-03-01 MED ORDER — MEMANTINE HCL 5 MG PO TABS: ORAL_TABLET | ORAL | 5 refills | Status: DC

## 2021-03-03 ENCOUNTER — Encounter: Payer: PPO | Admitting: Psychology

## 2021-03-10 ENCOUNTER — Encounter: Payer: PPO | Admitting: Psychology

## 2021-03-13 ENCOUNTER — Encounter: Payer: Self-pay | Admitting: Family Medicine

## 2021-03-13 ENCOUNTER — Encounter: Payer: PPO | Admitting: Psychology

## 2021-03-14 ENCOUNTER — Other Ambulatory Visit: Payer: Self-pay

## 2021-03-14 DIAGNOSIS — L600 Ingrowing nail: Secondary | ICD-10-CM

## 2021-03-17 ENCOUNTER — Ambulatory Visit: Payer: PPO | Admitting: Podiatrist

## 2021-03-17 ENCOUNTER — Other Ambulatory Visit: Payer: Self-pay

## 2021-03-17 DIAGNOSIS — L600 Ingrowing nail: Secondary | ICD-10-CM | POA: Diagnosis not present

## 2021-03-17 NOTE — Patient Instructions (Signed)
Soak Instructions    THE DAY AFTER THE PROCEDURE  Place 1/4 cup of epsom salts in a quart of warm tap water.  Submerge your foot or feet with outer bandage intact for the initial soak; this will allow the bandage to become moist and wet for easy lift off.  Once you remove your bandage, continue to soak in the solution for 20 minutes.  This soak should be done twice a day.  Next, remove your foot or feet from solution, blot dry the affected area and cover.  Apply polysporin or neosporin.   You may use a band aid large enough to cover the area or use gauze and tape.     IF YOUR SKIN BECOMES IRRITATED WHILE USING THESE INSTRUCTIONS, IT IS OKAY TO SWITCH TO  antibacterial soap pump soap (Dial)  and water to keep the toe clean instead of soaking in epsom salts.    Rachel Instructions-Post Nail Surgery  You have had your ingrown toenail and root treated with a chemical.  This chemical causes a burn that will drain and ooze like a blister.  This can drain for 6-8 weeks or longer.  It is important to keep this area clean, covered, and follow the soaking instructions dispensed at the time of your surgery.  This area will eventually dry and form a scab.  Once the scab forms you no longer need to soak or apply a dressing.  If at any time you experience an increase in pain, redness, swelling, or drainage, you should contact the office as soon as possible.

## 2021-03-17 NOTE — Progress Notes (Signed)
Chief Complaint  Patient presents with   Ingrown Toenail    Right great toenail is ingrown and painful on the medial side.  Patient relates soreness and redness along the side and pain with pressure.  He has tried epsom salt soaks with no relief.     HPI: Patient is 88 y.o. male who presents today for a painful ingrown right great toenail.  He relates the side of the nail has grown into the skin and is sore when touched with any type of pressure and in shoes.  He has tried epsom salt soaks with minimal relief.  He relates he is diabeic and his blood sugar runs in the low 100's.  His last HgA1c was 5.5  he denies any tingling, numbness or shooting pain.   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    Multiple trauma    Benign prostatic hyperplasia    Essential hypertension    Stage 3b chronic kidney disease (HCC)    Thrombocytopenia    History of multiple falls 07/14/2019   History of SAH (subarachnoid hemorrhage) 07/14/2019   Type 2 diabetes mellitus    Eosinophilia    Nocturnal leg cramps 11/23/2012   Pain in joint, shoulder region 11/23/2012   Hematoma 11/04/2012   Hyperlipidemia type IIB / III 03/08/2008   Orthostatic hypotension 03/08/2008   Coronary atherosclerosis of native coronary artery 03/08/2008    Current Outpatient Medications on File Prior to Visit  Medication Sig Dispense Refill   albuterol (PROAIR HFA) 108 (90 Base) MCG/ACT inhaler INHALE 2 PUFFS INTO THE LUNGS EVERY 6 HOURS AS NEEDED FOR WHEEZING OR SHORTNESS OF BREATH (Patient taking differently: Inhale 2 puffs into the lungs every 6 (six) hours as needed for wheezing or shortness of breath.) 6.7 g 0   amLODipine (NORVASC) 5  MG tablet Take 1 tablet (5 mg total) by mouth daily. Pt needs to keep upcoming appt in April for further refills 90 tablet 0   Apoaequorin (PREVAGEN EXTRA STRENGTH PO) Take 1 tablet by mouth daily.     atorvastatin (LIPITOR) 20 MG tablet TAKE 1 TABLET BY MOUTH EVERY DAY 90 tablet 1   BREO ELLIPTA 200-25 MCG/ACT AEPB INHALE 1 PUFF DAILY 60 each 1   Calcium Carb-Cholecalciferol (CALCIUM 600+D3 PO) Take 1 tablet by mouth 2 (two) times daily.     doxazosin (CARDURA) 2 MG tablet TAKE 1 TABLET BY MOUTH EVERY DAY 90 tablet 3   escitalopram (LEXAPRO) 10 MG tablet Take 1 tablet (10 mg total) by mouth daily. 90 tablet 2   ezetimibe (ZETIA) 10 MG tablet TAKE 1 TABLET BY MOUTH DAILY. Please keep 05/24/21 appt with Dr. [name] for future refills. Thank you. 30 tablet 3   lisinopril (ZESTRIL) 10 MG tablet Take 1 tablet (10 mg total) by mouth daily. 90 tablet 3   loratadine (CLARITIN) 10 MG tablet Take 10 mg by mouth daily as needed for allergies or rhinitis.      meclizine (ANTIVERT) 12.5 MG tablet Take 1 tablet (12.5 mg total) by mouth 3 (three) times daily as needed for dizziness. (Patient not taking: Reported on 01/18/2021) 15 tablet 0   memantine (NAMENDA) 5 MG tablet Take 1/2 tablet every night 30 tablet 5  Multiple Vitamin (MULTIVITAMIN WITH MINERALS) TABS tablet Take 1 tablet by mouth daily.     ondansetron (ZOFRAN ODT) 8 MG disintegrating tablet Take 1 tablet (8 mg total) by mouth every 8 (eight) hours as needed for nausea or vomiting. (Patient not taking: Reported on 01/18/2021) 10 tablet 0   terbinafine (LAMISIL) 250 MG tablet Take 1 tablet (250 mg total) by mouth daily. (Patient not taking: Reported on 01/18/2021) 90 tablet 3   No current facility-administered medications on file prior to visit.    Allergies  Allergen Reactions   Gabapentin Other (See Comments)    Visual changes and confusion- "I went blind while I was driving"    Review of Systems No fevers, chills, nausea, muscle aches, no  difficulty breathing, no calf pain, no chest pain or shortness of breath.   Physical Exam  GENERAL APPEARANCE: Alert, conversant. Appropriately groomed. No acute distress.   VASCULAR: Pedal pulses palpable 1/4 DP and unable to palpate PT bilateral.  Mild edema at the ankle level is noted. Capillary refill time is immediate to all digits,  Proximal to distal cooling it warm to warm.  Digital perfusion adequate.   NEUROLOGIC: sensation is intact to 5.07 monofilament at 3/5 sites bilateral.  Light touch is intact bilateral, vibratory sensation absent bilateral  MUSCULOSKELETAL: acceptable muscle strength, tone and stability bilateral.  Pes planus deformity is seen bilateral with mild digital contractures noted.   No pain, crepitus or limitation noted with foot and ankle range of motion bilateral.   DERMATOLOGIC: skin is warm, supple, and dry.  No open lesions noted.  No rash, no pre ulcerative lesions. Digital nails are asymptomatic.      Assessment     ICD-10-CM   1. Ingrown right big toenail  L60.0        Plan  Discussed exam findings and recommendations.  The right great toenail is painful to the touch and  ingrown into the skin and therefore a permanent phenol matrixectomy was recommended.  Patient agreed and the Skin was prepped with alcohol and a local injection of lidocaine and Marcaine plain was infiltrated to anesthetize the toe. The toe was then prepped with Betadine exsanguinated. The offending medial nail border of the right great toenail is removed and phenol applied.  It was cleansed well with alcohol. Antibiotic ointment and a dressing was then applied and the patient was given instructions for soaks and aftercare.  We also discussed diabetic shoes and their benefits.  He will discuss further with Aaron Edelman or pedorthist at a future visit.  If any concerns arise in the meantime he is instructed to call.

## 2021-03-20 ENCOUNTER — Encounter: Payer: Self-pay | Admitting: Podiatrist

## 2021-03-21 ENCOUNTER — Encounter (HOSPITAL_BASED_OUTPATIENT_CLINIC_OR_DEPARTMENT_OTHER): Payer: Self-pay

## 2021-03-21 ENCOUNTER — Other Ambulatory Visit: Payer: Self-pay

## 2021-03-21 ENCOUNTER — Emergency Department (HOSPITAL_BASED_OUTPATIENT_CLINIC_OR_DEPARTMENT_OTHER)
Admission: EM | Admit: 2021-03-21 | Discharge: 2021-03-21 | Disposition: A | Discharge status: Home / Self Care | Payer: PPO | Attending: Emergency Medicine | Admitting: Emergency Medicine

## 2021-03-21 DIAGNOSIS — H60531 Acute contact otitis externa, right ear: Secondary | ICD-10-CM | POA: Diagnosis not present

## 2021-03-21 DIAGNOSIS — N189 Chronic kidney disease, unspecified: Secondary | ICD-10-CM | POA: Insufficient documentation

## 2021-03-21 DIAGNOSIS — I129 Hypertensive chronic kidney disease with stage 1 through stage 4 chronic kidney disease, or unspecified chronic kidney disease: Secondary | ICD-10-CM | POA: Insufficient documentation

## 2021-03-21 DIAGNOSIS — E1122 Type 2 diabetes mellitus with diabetic chronic kidney disease: Secondary | ICD-10-CM | POA: Diagnosis not present

## 2021-03-21 DIAGNOSIS — I2510 Atherosclerotic heart disease of native coronary artery without angina pectoris: Secondary | ICD-10-CM | POA: Diagnosis not present

## 2021-03-21 DIAGNOSIS — H60503 Unspecified acute noninfective otitis externa, bilateral: Secondary | ICD-10-CM | POA: Diagnosis not present

## 2021-03-21 DIAGNOSIS — H60501 Unspecified acute noninfective otitis externa, right ear: Secondary | ICD-10-CM

## 2021-03-21 DIAGNOSIS — H6122 Impacted cerumen, left ear: Secondary | ICD-10-CM | POA: Diagnosis not present

## 2021-03-21 DIAGNOSIS — R002 Palpitations: Secondary | ICD-10-CM | POA: Diagnosis present

## 2021-03-21 DIAGNOSIS — I10 Essential (primary) hypertension: Secondary | ICD-10-CM | POA: Diagnosis not present

## 2021-03-21 DIAGNOSIS — Z79899 Other long term (current) drug therapy: Secondary | ICD-10-CM | POA: Insufficient documentation

## 2021-03-21 MED ORDER — CIPROFLOXACIN-DEXAMETHASONE 0.3-0.1 % OT SUSP: 4.0000 [drp] | Freq: Two times a day (BID) | OTIC | 0 refills | Status: AC

## 2021-03-21 MED ORDER — CARBAMIDE PEROXIDE 6.5 % OT SOLN: 5.0000 [drp] | Freq: Once | OTIC | Status: DC

## 2021-03-21 NOTE — ED Triage Notes (Signed)
Last night of rapid heart rate and high blood pressure.  Denies chest pain or SOB

## 2021-03-21 NOTE — ED Provider Notes (Addendum)
[facility] EMERGENCY DEPT Provider Note   CSN: [id number] Arrival date & time: 03/21/21  2119     History  Chief Complaint  Patient presents with   Palpitations    [name] is a 88 y.o. male.   Palpitations  88 year old male with a history of Alzheimer's dementia, HLD, CAD, HTN, CKD, DM 2, chronic loculated left-sided pleural effusion who presents to the emergency department with concern for hypertension.  The patient states that he woke up around 4 AM with a sensation of pounding in his ears bilaterally.  He has problems with clogged ears at baseline and has follow-up with ENT scheduled later this spring.  He checked his blood pressure at home and it was found to be elevated to greater than 417 systolic.  His daughter was concerned due to his elevated blood pressure and brought him to the emergency department for further evaluation and management.  He denies any vision changes, headache, fever or chills, chest pain, shortness of breath, nausea, vomiting, abdominal pain.  He is currently asymptomatic with improvement in his blood pressure without intervention.  Other than a sensation of full ears and clogged ears with decreased hearing, he denies any other complaints.  On chart review, the patient was found to have a pulmonary nodule on CT which ultimately had resolved per pulmonology follow-up on PET scan later in December.  He denies any cough or worsening shortness of breath.    Home Medications Prior to Admission medications   Medication Sig Start Date End Date Taking? Authorizing Provider  ciprofloxacin-dexamethasone (CIPRODEX) OTIC suspension Place 4 drops into the right ear 2 (two) times daily for 7 days. 03/21/21 03/28/21 Yes Regan Lemming, MD  albuterol (PROAIR HFA) 108 (90 Base) MCG/ACT inhaler INHALE 2 PUFFS INTO THE LUNGS EVERY 6 HOURS AS NEEDED FOR WHEEZING OR SHORTNESS OF BREATH Patient taking differently: Inhale 2 puffs into the lungs every 6 (six)  hours as needed for wheezing or shortness of breath. 08/06/19   Angiulli, Lavon Paganini, PA-C  amLODipine (NORVASC) 5 MG tablet Take 1 tablet (5 mg total) by mouth daily. Pt needs to keep upcoming appt in April for further refills 02/13/21   Sherren Mocha, MD  Apoaequorin [facility] EXTRA STRENGTH PO) Take 1 tablet by mouth daily.    [provider]  atorvastatin (LIPITOR) 20 MG tablet TAKE 1 TABLET BY MOUTH EVERY DAY 02/13/21   [name], MD  BREO ELLIPTA 200-25 MCG/ACT AEPB INHALE 1 PUFF DAILY 02/20/21   [name], MD  Calcium Carb-Cholecalciferol (CALCIUM 600+D3 PO) Take 1 tablet by mouth 2 (two) times daily.    [provider]  doxazosin (CARDURA) 2 MG tablet TAKE 1 TABLET BY MOUTH EVERY DAY 12/26/20   [name], MD  escitalopram (LEXAPRO) 10 MG tablet Take 1 tablet (10 mg total) by mouth daily. 07/14/20   [name], MD  ezetimibe (ZETIA) 10 MG tablet TAKE 1 TABLET BY MOUTH DAILY. Please keep 05/24/21 appt with Dr. [name] for future refills. Thank you. 02/22/21   Sherren Mocha, MD  lisinopril (ZESTRIL) 10 MG tablet Take 1 tablet (10 mg total) by mouth daily. 11/24/20   [name], MD  loratadine (CLARITIN) 10 MG tablet Take 10 mg by mouth daily as needed for allergies or rhinitis.     [provider]  meclizine (ANTIVERT) 12.5 MG tablet Take 1 tablet (12.5 mg total) by mouth 3 (three) times daily as needed for dizziness. Patient not taking: Reported on  01/18/2021 09/28/20   Lajean Saver, MD  memantine [facility]) 5 MG tablet Take 1/2 tablet every night 03/01/21   Cameron Sprang, MD  Multiple Vitamin (MULTIVITAMIN WITH MINERALS) TABS tablet Take 1 tablet by mouth daily.    [provider]  ondansetron (ZOFRAN ODT) 8 MG disintegrating tablet Take 1 tablet (8 mg total) by mouth every 8 (eight) hours as needed for nausea or vomiting. Patient not taking: Reported on 01/18/2021 09/28/20   Lajean Saver, MD  terbinafine (LAMISIL) 250 MG tablet  Take 1 tablet (250 mg total) by mouth daily. Patient not taking: Reported on 01/18/2021 08/22/20   [name], MD      Allergies    Gabapentin    Review of Systems   Review of Systems  HENT:  Positive for ear pain and hearing loss.   Cardiovascular:  Positive for palpitations.  All other systems reviewed and are negative.  Physical Exam Updated Vital Signs BP (!) 151/60    Pulse 63    Temp 98.5 F (36.9 C) (Oral)    Resp 11    Ht 5\' 6"  (1.676 m)    Wt 78.9 kg    SpO2 98%    BMI 28.08 kg/m  Physical Exam Vitals and nursing note reviewed.  Constitutional:      General: He is not in acute distress.    Appearance: He is well-developed.  HENT:     Head: Normocephalic and atraumatic.     Left Ear: There is impacted cerumen.     Ears:     Comments: Cerumen impaction noted on the left, narrowing of the ear canal and buildup of skin debris in the external auditory canal on the right.  No drainage, no mastoid tenderness, no tenderness of the auricle. Eyes:     Conjunctiva/sclera: Conjunctivae normal.  Cardiovascular:     Rate and Rhythm: Normal rate and regular rhythm.     Heart sounds: No murmur heard. Pulmonary:     Effort: Pulmonary effort is normal. No respiratory distress.     Breath sounds: Normal breath sounds.  Abdominal:     Palpations: Abdomen is soft.     Tenderness: There is no abdominal tenderness.  Musculoskeletal:        General: No swelling.     Cervical back: Neck supple.  Skin:    General: Skin is warm and dry.     Capillary Refill: Capillary refill takes less than 2 seconds.  Neurological:     Mental Status: He is alert.  Psychiatric:        Mood and Affect: Mood normal.    ED Results / Procedures / Treatments   Labs (all labs ordered are listed, but only abnormal results are displayed) Labs Reviewed - No data to display  EKG EKG Interpretation  Date/Time:  Tuesday March 21 2021 07:38:55 EST Ventricular Rate:  73 PR Interval:  154 QRS  Duration: 111 QT Interval:  390 QTC Calculation: 430 R Axis:   -41 Text Interpretation: Sinus rhythm Left axis deviation Confirmed by Regan Lemming ([id number]) on 03/21/2021 7:47:28 AM  Radiology No results found.  Procedures Procedures    Medications Ordered in ED Medications - No data to display  ED Course/ Medical Decision Making/ A&P                           Medical Decision Making Risk Prescription drug management.   88 year old male with a history of Alzheimer's  dementia, HLD, CAD, HTN, CKD, DM 2, chronic loculated left-sided pleural effusion who presents to the emergency department with concern for hypertension.  The patient states that he woke up around 4 AM with a sensation of pounding in his ears bilaterally.  He has problems with clogged ears at baseline and has follow-up with ENT scheduled later this spring.  He checked his blood pressure at home and it was found to be elevated to greater than 696 systolic.  His daughter was concerned due to his elevated blood pressure and brought him to the emergency department for further evaluation and management.  He denies any vision changes, headache, fever or chills, chest pain, shortness of breath, nausea, vomiting, abdominal pain.  He is currently asymptomatic with improvement in his blood pressure without intervention.  Other than a sensation of full ears and clogged ears with decreased hearing, he denies any other complaints.  On chart review, the patient was found to have a pulmonary nodule on CT which ultimately had resolved per pulmonology follow-up on PET scan later in December.  He denies any cough or worsening shortness of breath.    Patient presenting with asymptomatic hypertension.  BP improved to 151/60.  Currently well-appearing, EKG without ischemic changes, sinus rhythm noted on cardiac telemetry.  Patient has not yet taken his home antihypertensives.  Low concern for hypertensive emergency, ACS, PE at this time.  Low concern  for cardiac arrhythmia as etiology of the patient's presentation.   Discussed with the patient's wife concern for possible developing otitis externa.  A prescription for Ciprodex drops was sent electronically to the patient's pharmacy.  Recommended PCP follow-up in 1 week for repeat evaluation.  Patient was found to have a cerumen impaction and this was irrigated by nursing with subsequent improvement in the patient's symptoms.  The patient was advised to follow-up with his PCP for blood pressure recheck and to discuss further outpatient antihypertensive management.  He was advised to follow-up with ENT and his cardiologist outpatient.  Final Clinical Impression(s) / ED Diagnoses Final diagnoses:  Asymptomatic hypertension  Impacted cerumen of left ear  Acute otitis externa of right ear, unspecified type    Rx / DC Orders ED Discharge Orders          Ordered    ciprofloxacin-dexamethasone (CIPRODEX) OTIC suspension  2 times daily        03/21/21 1003              Regan Lemming, MD 03/21/21 2952    Regan Lemming, MD 03/21/21 1006

## 2021-03-21 NOTE — ED Notes (Signed)
RT Note: 12-Lead EKG obtained/Labelled/Given Regan Lemming MD

## 2021-03-21 NOTE — ED Notes (Signed)
Both ears irragated

## 2021-03-21 NOTE — Discharge Instructions (Addendum)
You were evaluated in the Emergency Department and after careful evaluation, we did not find any emergent condition requiring admission or further testing in the hospital.  Your exam/testing today was overall reassuring.  Your EKG was without acute changes.  You have asymptomatic hypertension which does not warrant further evaluation with laboratory work-up.  Recommend following up with your PCP in 1 week for blood pressure recheck and to discuss your antihypertensive medications.  Your ears were irrigated in the ED by nursing. Recommend follow-up with ENT per your regular scheduled follow-up.  Please return to the Emergency Department if you experience any worsening of your condition.  Thank you for allowing [place] to be a part of your care.

## 2021-03-22 ENCOUNTER — Encounter: Payer: Self-pay | Admitting: Physician Assistant

## 2021-03-22 ENCOUNTER — Ambulatory Visit: Payer: PPO | Admitting: Physician Assistant

## 2021-03-22 VITALS — BP 123/67 | HR 88 | Resp 20 | Ht 67.5 in | Wt 185.0 lb

## 2021-03-22 DIAGNOSIS — F0390 Unspecified dementia without behavioral disturbance: Secondary | ICD-10-CM | POA: Diagnosis not present

## 2021-03-22 NOTE — Progress Notes (Signed)
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  Discussed the importance of regular daily schedule with inclusion of crossword puzzles to maintain brain function.  Continue to monitor mood by PCP Stay active at least 30 minutes at least 3 times a week.  Naps should be scheduled and should be no longer than 60 minutes and should not occur after 2 PM.  Mediterranean diet is recommended  Control cardiovascular risk factors  Continue d memantine 2.5 mg daily side effects were discussed Neuropsych evaluation to evaluate trajectory and clarity of diagnosis Follow up after Neuropsych    Case discussed with Dr. Delice Lesch who agrees with the plan      Subjective:    [name] is a very pleasant 88 y.o. RH male with a history of hypertension, CAD, diabetes, hyperlipidemia, with a diagnosis of mild Alzheimer's disease per Neuropsych evaluation on January 2022, seen today in follow-up.  MRI of the brain remarkable for mild diffuse atrophy and chronic microvascular disease.  Last MMSE was 21/30.  He was on rivastigmine 3 mg twice daily but he developed "hollowed head ", changing to memantine at 2.5 mg nightly for 2 weeks, then with a goal of increasing to 5 mg at night.. This patient is accompanied in the office by his wife who supplements the history.  Previous records as well as any outside records available were reviewed prior to todays visit.  Patient was last seen at our office on 09/09/2020  Overall, his wife feels that his memory may be "a little bit worse ".  He asks questions "over and over ".  He can trace memory is further back, but sometimes he does not recognize her.  He does not like to do much activity, or play any mind games such as crossword puzzles.  He does however, keep statistics and soccer games.  His mood is fairly good, without hallucinations  or paranoia.  He does not sleep well, refuses to use CPAP.  At times, he moves at night, questionable REM behavior, and he has daytime drowsiness and snores.  His tremors are about the same, he uses devices to ambulate.  He denies any recent falls.  He also developed some hygiene concerns, not wanting to bathe, and likes to wear the same clothes every day, so she has to help him change.  She states that his appetite is "too good ", and at times does not remember that he ate already and eat again.  He admits to not socializing enough.  He has a home health nurse coming regularly especially when his wife has to run around.  Recent presentations the ER include on 03/21/2021 for palpitations, with negative cardiac work-up, but a concern for hypertension.      History on Initial Assessment 03/10/2020: This is a pleasant 88 year old right-handed man with a history of hypertension, CAD, diabetes, hyperlipidemia, presenting for management of Alzheimer's disease. He underwent Neuropsychological testing in last month with findings concerning for AD. Records were reviewed. He had an unwitnessed fall in June 2021 and sustained several fractures and a subarachnoid hemorrhage on the left that did not require surgical intervention. He started reporting forgetfulness at that time. He was discharged home but back in the hospital a month later with headache, low back pain and fever due to UTI. Head CT showed a left hemispheric subdural hemorrhage, new from prior CT,  resolved on follow-up CT in September 2021. He had another fall in November 2021 when he missed a step. He was back in the ER a few weeks later due to increasing confusion and forgetfulness, frequent right-sided headaches, and a clicking noise in his neck, attributed to his recent fall. He was back in the ER again in January 2022 for dizziness, worse when he moves quickly or gets up too quickly, leading to falls. 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 Vestibular therapy helped with dizziness. Neuropsychological evaluation indicated prominent impairment surrounding all aspects of learning and memory, meeting criteria for Major Neurocognitive Disorder, mild end of spectrum, etiology concerning for Alzheimer's disease. There may be a vascular component as well. Prior TBI exacerbated deficits that were already present rather than being primarily responsible for dysfunction.    They report his long-term memory is good, he can remember long passages from [place]. [name] reports some memory issues prior to the fall, for instance he would recall a story differently or he could not keep an appointment date in his mind or a prior decision they had made. He has not been driving. He manages his own pillbox with a good system, [name] checks behind him and notes he makes mistakes sometimes. He denies any difficulties managing finances, [name] has been managing them since the fall in June and notes there was a notification from the bank prior to his fall. He is fairly independent, [name] stays close during dressing and bathing mostly for safety purposes. He gets dizzy the most when bending down. He uses a cane all the time. He states mood is not bad, "I'm not miserable." A few times he would ask their daughter where her mother or when [name] is coming back when she is right there. He thinks the daughter who lives with them is a boy, using male pronouns for her, which is new. There have been times he thinks there is a second cat or the cat is a dog, mostly at night. He used to think they had 2 bedrooms.    He denies any headaches. He has occasional vertigo and feels like he would fall, last fall was in January. Dizziness mostly occurs when he gets up, but he has also reported it while sitting or supine. It lasts at most 2 minutes, no nausea/vomiting, vision changes. The  other night he woke up and saw nothing except for 2 black rings that he described as full black moons, this has happened twice. He has left trigeminal neuralgia. He was having orthostatic hypotension initially after the fall and they had to stop PT. BP has improved but he still has occasional dizziness. No focal numbness/tingling/weakness. He has neck pain and notes a "clicking" in his neck. Cervical xray showed cervical spondylosis, trace grade 1 anterolisthesis at C2-3, C3-4, C5-6, and C6-7. He has low back pain. No bowel/bladder dysfunction. No anosmia but sometimes smells food when there is none, usually at night. He has mild hand tremors. He has occasional daytime drowsiness, sleeping on his back due to pain, however [name] notes he sounds like he is gasping for breath. He snores sometimes.    Diagnostic Data:    Neuropsychological evaluation in 02/29/2020 with a diagnosis of Major Neurocognitive Disorder, etiology concerning for late-onset Alzheimer's disease. "There was prominent impairment surrounding all aspects of learning and memory. Additional impairments were seen across aspects of executive functioning (  i.e., cognitive flexibility and response inhibition), while performance variability was exhibited across attention/concentration, expressive language, and visuospatial abilities. Regarding his prior TBI, it is more likely that the presence of this injury exacerbated deficits that were already present and worsening due to an underlying cognitive disorder rather than this injury being primarily responsible for the aforementioned dysfunction. His pattern of performance is inconsistent with what would be expected from his TBI alone with memory dysfunction being far more severe. Repeat testing recommended in 12-18 months."   MRI brain with and without contrast done 04/2020 did not show any acute changes. 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    His exam on initial visit also showed hyperreflexia and fasciculations, and he was reporting neck pain. MRI cervical spine did not show any cord abnormalities, there was mild to moderate spinal stenosis and foraminal stenosis at several levels.    EMG/NCV was normal, no evidence of motor neuron disease.   PREVIOUS MEDICATIONS:   CURRENT MEDICATIONS:  Outpatient Encounter Medications as of 03/22/2021  Medication Sig   albuterol (PROAIR HFA) 108 (90 Base) MCG/ACT inhaler INHALE 2 PUFFS INTO THE LUNGS EVERY 6 HOURS AS NEEDED FOR WHEEZING OR SHORTNESS OF BREATH (Patient taking differently: Inhale 2 puffs into the lungs every 6 (six) hours as needed for wheezing or shortness of breath.)   amLODipine (NORVASC) 5 MG tablet Take 1 tablet (5 mg total) by mouth daily. Pt needs to keep upcoming appt in April for further refills   Apoaequorin (PREVAGEN EXTRA STRENGTH PO) Take 1 tablet by mouth daily.   atorvastatin (LIPITOR) 20 MG tablet TAKE 1 TABLET BY MOUTH EVERY DAY   BREO ELLIPTA 200-25 MCG/ACT AEPB INHALE 1 PUFF DAILY   Calcium Carb-Cholecalciferol (CALCIUM 600+D3 PO) Take 1 tablet by mouth 2 (two) times daily.   ciprofloxacin-dexamethasone (CIPRODEX) OTIC suspension Place 4 drops into the right ear 2 (two) times daily for 7 days.   doxazosin (CARDURA) 2 MG tablet TAKE 1 TABLET BY MOUTH EVERY DAY   escitalopram (LEXAPRO) 10 MG tablet Take 1 tablet (10 mg total) by mouth daily.   ezetimibe (ZETIA) 10 MG tablet TAKE 1 TABLET BY MOUTH DAILY. Please keep 05/24/21 appt with Dr. [name] for future refills. Thank you.   lisinopril (ZESTRIL) 10 MG tablet Take 1 tablet (10 mg total) by mouth daily.   loratadine (CLARITIN) 10 MG tablet Take 10 mg by mouth daily as needed for allergies or rhinitis.    memantine (NAMENDA) 5 MG tablet Take 1/2 tablet every night   Multiple Vitamin (MULTIVITAMIN WITH MINERALS) TABS tablet Take 1 tablet by mouth daily.   meclizine  (ANTIVERT) 12.5 MG tablet Take 1 tablet (12.5 mg total) by mouth 3 (three) times daily as needed for dizziness. (Patient not taking: Reported on 03/22/2021)   ondansetron (ZOFRAN ODT) 8 MG disintegrating tablet Take 1 tablet (8 mg total) by mouth every 8 (eight) hours as needed for nausea or vomiting. (Patient not taking: Reported on 01/18/2021)   terbinafine (LAMISIL) 250 MG tablet Take 1 tablet (250 mg total) by mouth daily. (Patient not taking: Reported on 01/18/2021)   No facility-administered encounter medications on file as of 03/22/2021.     Objective:     PHYSICAL EXAMINATION:    VITALS:   Vitals:   03/22/21 0902  BP: 123/67  Pulse: 88  Resp: 20  SpO2: 96%  Weight: 185 lb (83.9 kg)  Height: 5' 7.5" (  1.715 m)    GEN:  The patient appears stated age and is in NAD. HEENT:  Normocephalic, atraumatic.   Neurological examination:  General: NAD, well-groomed, appears stated age. Orientation: The patient is alert. Oriented to person, place and date Cranial nerves: There is good facial symmetry.The speech is fluent and clear. No aphasia or dysarthria. Fund of knowledge is appropriate. Recent and remote memory are impaired. Attention and concentration are reduced.  Able to name objects and repeat phrases.  Hearing is intact to conversational tone.    Sensation: Sensation is intact to light touch throughout Motor: Strength is at least antigravity x4. Tremors: none  DTR's 2/4 in UE/LE    No flowsheet data found. MMSE - Mini Mental State Exam 09/09/2020  Orientation to time 1  Orientation to Place 5  Registration 3  Attention/ Calculation 5  Recall 0  Language- name 2 objects 2  Language- repeat 1  Language- follow 3 step command 2  Language- read & follow direction 1  Write a sentence 1  Copy design 0  Total score 21    No flowsheet data found.     Movement examination: Tone: There is normal tone in the UE/LE Abnormal movements:  no tremor.  No myoclonus.  No  asterixis.   Coordination:  There is no decremation with RAM's. Normal finger to nose  Gait and Station: The patient has no difficulty arising out of a deep-seated chair without the use of the hands. The patient's stride length is good.  Gait is cautious and narrow.        Total time spent on today's visit was minutes, including both face-to-face time and nonface-to-face time. Time included that spent on review of records (prior notes available to me/labs/imaging if pertinent), discussing treatment and goals, answering patient's questions and coordinating care.  Cc:  [name], MD Sharene Butters, PA-C

## 2021-03-22 NOTE — Patient Instructions (Addendum)
Continue memantine as prescribed  Follow up  after neurocongitive testing  . Schedule repeat Neurocognitive testing, follow-up after evaluation   FALL PRECAUTIONS: Be cautious when walking. Scan the area for obstacles that may increase the risk of trips and falls. When getting up in the mornings, sit up at the edge of the bed for a few minutes before getting out of bed. Consider elevating the bed at the head end to avoid drop of blood pressure when getting up. Walk always in a well-lit room (use night lights in the walls). Avoid area rugs or power cords from appliances in the middle of the walkways. Use a walker or a cane if necessary and consider physical therapy for balance exercise. Get your eyesight checked regularly.   HOME SAFETY: Consider the safety of the kitchen when operating appliances like stoves, microwave oven, and blender. Consider having supervision and share cooking responsibilities until no longer able to participate in those. Accidents with firearms and other hazards in the house should be identified and addressed as well.   ABILITY TO BE LEFT ALONE: If patient is unable to contact 911 operator, consider using LifeLine, or when the need is there, arrange for someone to stay with patients. Smoking is a fire hazard, consider supervision or cessation. Risk of wandering should be assessed by caregiver and if detected at any point, supervision and safe proof recommendations should be instituted.  MEDICATION SUPERVISION: Inability to self-administer medication needs to be constantly addressed. Implement a mechanism to ensure safe administration of the medications.  RECOMMENDATIONS FOR ALL PATIENTS WITH MEMORY PROBLEMS: 1. Continue to exercise (Recommend 30 minutes of walking everyday, or 3 hours every week) 2. Increase social interactions - continue going to [employer] and enjoy social gatherings with friends and family 3. Eat healthy, avoid fried foods and eat more fruits and  vegetables 4. Maintain adequate blood pressure, blood sugar, and blood cholesterol level. Reducing the risk of stroke and cardiovascular disease also helps promoting better memory. 5. Avoid stressful situations. Live a simple life and avoid aggravations. Organize your time and prepare for the next day in anticipation. 6. Sleep well, avoid any interruptions of sleep and avoid any distractions in the bedroom that may interfere with adequate sleep quality 7. Avoid sugar, avoid sweets as there is a strong link between excessive sugar intake, diabetes, and cognitive impairment We discussed the Mediterranean diet, which has been shown to help patients reduce the risk of progressive memory disorders and reduces cardiovascular risk. This includes eating fish, eat fruits and green leafy vegetables, nuts like almonds and hazelnuts, walnuts, and also use olive oil. Avoid fast foods and fried foods as much as possible. Avoid sweets and sugar as sugar use has been linked to worsening of memory function.  There is always a concern of gradual progression of memory problems. If this is the case, then we may need to adjust level of care according to patient needs. Support, both to the patient and caregiver, should then be put into place.       Mediterranean Diet  Why follow it? Research shows Those who follow the Mediterranean diet have a reduced risk of heart disease  The diet is associated with a reduced incidence of Parkinson's and Alzheimer's diseases People following the diet may have longer life expectancies and lower rates of chronic diseases  The Dietary Guidelines for Americans recommends the Mediterranean diet as an eating plan to promote health and prevent disease  What Is the Mediterranean Diet?  Healthy eating plan  based on typical foods and recipes of Mediterranean-style cooking The diet is primarily a plant based diet; these foods should make up a majority of meals   Starches - Plant based foods  should make up a majority of meals - They are an important sources of vitamins, minerals, energy, antioxidants, and fiber - Choose whole grains, foods high in fiber and minimally processed items  - Typical grain sources include wheat, oats, barley, corn, brown rice, bulgar, farro, millet, polenta, couscous  - Various types of beans include chickpeas, lentils, fava beans, black beans, white beans   Fruits  Veggies - Large quantities of antioxidant rich fruits & veggies; 6 or more servings  - Vegetables can be eaten raw or lightly drizzled with oil and cooked  - Vegetables common to the traditional Mediterranean Diet include: artichokes, arugula, beets, broccoli, brussel sprouts, cabbage, carrots, celery, collard greens, cucumbers, eggplant, kale, leeks, lemons, lettuce, mushrooms, okra, onions, peas, peppers, potatoes, pumpkin, radishes, rutabaga, shallots, spinach, sweet potatoes, turnips, zucchini - Fruits common to the Mediterranean Diet include: apples, apricots, avocados, cherries, clementines, dates, figs, grapefruits, grapes, melons, nectarines, oranges, peaches, pears, pomegranates, strawberries, tangerines  Fats - Replace butter and margarine with healthy oils, such as olive oil, canola oil, and tahini  - Limit nuts to no more than a handful a day  - Nuts include walnuts, almonds, pecans, pistachios, pine nuts  - Limit or avoid candied, honey roasted or heavily salted nuts - Olives are central to the [employer] - can be eaten whole or used in a variety of dishes   Meats Protein - Limiting red meat: no more than a few times a month - When eating red meat: choose lean cuts and keep the portion to the size of deck of cards - Eggs: approx. 0 to 4 times a week  - Fish and lean poultry: at least 2 a week  - Healthy protein sources include, chicken, [place], lean beef, lamb - Increase intake of seafood such as tuna, salmon, trout, mackerel, shrimp, scallops - Avoid or limit high fat  processed meats such as sausage and bacon  Dairy - Include moderate amounts of low fat dairy products  - Focus on healthy dairy such as fat free yogurt, skim milk, low or reduced fat cheese - Limit dairy products higher in fat such as whole or 2% milk, cheese, ice cream  Alcohol - Moderate amounts of red wine is ok  - No more than 5 oz daily for women (all ages) and men older than age 5  - No more than 10 oz of wine daily for men younger than 40  Other - Limit sweets and other desserts  - Use herbs and spices instead of salt to flavor foods  - Herbs and spices common to the traditional Mediterranean Diet include: basil, bay leaves, chives, cloves, cumin, fennel, garlic, lavender, marjoram, mint, oregano, parsley, pepper, rosemary, sage, savory, sumac, tarragon, thyme   Its not just a diet, its a lifestyle:  The Mediterranean diet includes lifestyle factors typical of those in the region  Foods, drinks and meals are best eaten with others and savored Daily physical activity is important for overall good health This could be strenuous exercise like running and aerobics This could also be more leisurely activities such as walking, housework, yard-work, or taking the stairs Moderation is the key; a balanced and healthy diet accommodates most foods and drinks Consider portion sizes and frequency of consumption of certain foods   Meal Ideas &  Options:  Breakfast:  Whole wheat toast or whole wheat English muffins with peanut butter & hard boiled egg Steel cut oats topped with apples & cinnamon and skim milk  Fresh fruit: banana, strawberries, melon, berries, peaches  Smoothies: strawberries, bananas, greek yogurt, peanut butter Low fat greek yogurt with blueberries and granola  Egg white omelet with spinach and mushrooms Breakfast couscous: whole wheat couscous, apricots, skim milk, cranberries  Sandwiches:  Hummus and grilled vegetables (peppers, zucchini, squash) on whole wheat bread    Grilled chicken on whole wheat pita with lettuce, tomatoes, cucumbers or tzatziki  [place] salad on whole wheat bread: tuna salad made with greek yogurt, olives, red peppers, capers, green onions Garlic rosemary lamb pita: lamb sauted with garlic, rosemary, salt & pepper; add lettuce, cucumber, greek yogurt to pita - flavor with lemon juice and black pepper  Seafood:  Mediterranean grilled salmon, seasoned with garlic, basil, parsley, lemon juice and black pepper Shrimp, lemon, and spinach whole-grain pasta salad made with low fat greek yogurt  Seared scallops with lemon orzo  Seared tuna steaks seasoned salt, pepper, coriander topped with tomato mixture of olives, tomatoes, olive oil, minced garlic, parsley, green onions and cappers  Meats:  Herbed greek chicken salad with kalamata olives, cucumber, feta  Red bell peppers stuffed with spinach, bulgur, lean ground beef (or lentils) & topped with feta   Kebabs: skewers of chicken, tomatoes, onions, zucchini, squash  [place] burgers: made with red onions, mint, dill, lemon juice, feta cheese topped with roasted red peppers Vegetarian Cucumber salad: cucumbers, artichoke hearts, celery, red onion, feta cheese, tossed in olive oil & lemon juice  Hummus and whole grain pita points with a greek salad (lettuce, tomato, feta, olives, cucumbers, red onion) Lentil soup with celery, carrots made with vegetable broth, garlic, salt and pepper  Tabouli salad: parsley, bulgur, mint, scallions, cucumbers, tomato, radishes, lemon juice, olive oil, salt and pepper.

## 2021-03-23 ENCOUNTER — Telehealth: Payer: Self-pay | Admitting: *Deleted

## 2021-03-23 NOTE — Telephone Encounter (Signed)
Patient's wife is calling because husband's toenail that was removed has redness in that toe even though a scab has formed. Please advise

## 2021-03-27 ENCOUNTER — Ambulatory Visit (INDEPENDENT_AMBULATORY_CARE_PROVIDER_SITE_OTHER): Payer: PPO | Admitting: Podiatry

## 2021-03-27 ENCOUNTER — Other Ambulatory Visit: Payer: Self-pay

## 2021-03-27 ENCOUNTER — Ambulatory Visit: Payer: PPO

## 2021-03-27 ENCOUNTER — Telehealth: Payer: Self-pay

## 2021-03-27 ENCOUNTER — Encounter: Payer: Self-pay | Admitting: Cardiovascular Disease

## 2021-03-27 ENCOUNTER — Encounter: Payer: Self-pay | Admitting: Podiatry

## 2021-03-27 DIAGNOSIS — E119 Type 2 diabetes mellitus without complications: Secondary | ICD-10-CM

## 2021-03-27 DIAGNOSIS — L03031 Cellulitis of right toe: Secondary | ICD-10-CM | POA: Diagnosis not present

## 2021-03-27 NOTE — Telephone Encounter (Signed)
Casts Sent to [employer] - HOLD for CMN °

## 2021-03-27 NOTE — Progress Notes (Signed)
SITUATION Reason for Consult: Evaluation for Prefabricated Diabetic Shoes and Bilateral Custom Diabetic Inserts. Patient / Caregiver Report: Patient would like well fitting shoes  OBJECTIVE DATA: Patient History / Diagnosis:    ICD-10-CM   1. Type 2 diabetes mellitus without complication, without long-term current use of insulin (HCC)  E11.9       Current or Previous Devices:   None and no history  In-Person Foot Examination: Ulcers & Callousing:   None  Toe / Foot Deformities:   - None diagnosed   Shoe Size: 9.5W  ORTHOTIC RECOMMENDATION Recommended Devices: - 1x pair prefabricated PDAC approved diabetic shoes: Patient Selects Apex V751M 9.5W - 3x pair custom-to-patient vacuum formed diabetic insoles.   GOALS OF SHOES AND INSOLES - Reduce shear and pressure - Reduce / Prevent callus formation - Reduce / Prevent ulceration - Protect the fragile healing compromised diabetic foot.  Patient would benefit from diabetic shoes and inserts as patient has diabetes mellitus and the patient has one or more of the following conditions: - History of partial or complete amputation of the foot - History of previous foot ulceration. - History of pre-ulcerative callus - Peripheral neuropathy with evidence of callus formation - Foot deformity - Poor circulation  ACTIONS PERFORMED Patient was casted for insoles via crush box and measured for shoes via brannock device. Procedure was explained and patient tolerated procedure well. All questions were answered and concerns addressed.  PLAN Patient is to ensure treating physician receives and completes diabetic paperwork. Casts and shoe order are to be held until paperwork is received. Once received patient is to be scheduled for fitting in four weeks.

## 2021-03-29 NOTE — Progress Notes (Signed)
Subjective:   Patient ID: [name], male   DOB: 88 y.o.   MRN: [id number]   HPI Patient presents concerned about some redness in the right big toenail and the possibility you could have an infection   ROS      Objective:  Physical Exam  Neurovascular status unchanged with patient found to have thickened nailbeds with an incurvation of the medial border right hallux localized with no proximal edema edema drainage noted     Assessment:  Low-grade paronychia of the right hallux local to this area     Plan:  H&P education rendered begin soaks and using sterile instrument I debrided out the corner which relieved the pressure and applied Band-Aid with Neosporin.  If it were to get worse or become more involved we may have to place on antibiotics and I may have to remove a bigger portion of the nail and I explained those faxed to him

## 2021-03-30 DIAGNOSIS — H9222 Otorrhagia, left ear: Secondary | ICD-10-CM

## 2021-03-30 HISTORY — DX: Otorrhagia, left ear: H92.22

## 2021-03-31 DIAGNOSIS — Z8669 Personal history of other diseases of the nervous system and sense organs: Secondary | ICD-10-CM | POA: Diagnosis not present

## 2021-03-31 DIAGNOSIS — H6691 Otitis media, unspecified, right ear: Secondary | ICD-10-CM | POA: Diagnosis not present

## 2021-04-12 ENCOUNTER — Other Ambulatory Visit: Payer: Self-pay | Admitting: Family Medicine

## 2021-04-13 DIAGNOSIS — H9222 Otorrhagia, left ear: Secondary | ICD-10-CM | POA: Diagnosis not present

## 2021-04-18 NOTE — Progress Notes (Deleted)
? ?Chronic Care Management ?Pharmacy Note ? ?04/20/2021 ?Name:  [name] MRN:  [id number] DOB:  09/06/1933 ? ?Summary: ?Initial visit with PharmD.  Meds reviewed/updated.   No concerns with affordability.  He is due for regular lab workup.  Requests to do this at the same time he comes in for LFT's.   ? ?Recommendations/Changes made from today's visit: ?Recheck A1c and lipids ? ?Plan: ?FU PharmD 6 months ? ? ?Subjective: ?[name] is an 87 y.o. year old male who is a primary patient of Pickard, Warren T, MD.  The CCM team was consulted for assistance with disease management and care coordination needs.   ? ?Engaged with patient by telephone for initial visit in response to provider referral for pharmacy case management and/or care coordination services.  ? ?Consent to Services:  ?The patient was given the following information about Chronic Care Management services today, agreed to services, and gave verbal consent: 1. CCM service includes personalized support from designated clinical staff supervised by the primary care provider, including individualized plan of care and coordination with other care providers 2. 24/7 contact phone numbers for assistance for urgent and routine care needs. 3. Service will only be billed when office clinical staff spend 20 minutes or more in a month to coordinate care. 4. Only one practitioner may furnish and bill the service in a calendar month. 5.The patient may stop CCM services at any time (effective at the end of the month) by phone call to the office staff. 6. The patient will be responsible for cost sharing (co-pay) of up to 20% of the service fee (after annual deductible is met). Patient agreed to services and consent obtained. ? ?Patient Care Team: ?Pickard, Warren T, MD as PCP - General (Family Medicine) ?Cooper, Michael, MD as PCP - Cardiology (Cardiology) ?Angelito, Noel V, RPH (Inactive) as Pharmacist (Pharmacist) ?Pickard, Warren T, MD (Family  Medicine) ?Aquino, Karen M, MD as Consulting Physician (Neurology) ?,  L, RPH as Pharmacist (Pharmacist) ?Recent office visits:  ?10/18/20 Dr. Pickard For fever, illness. STARTED Molnupiravir 800 mg 2 times daily.  ?08/22/20 Dr. Pickard For skin discoloration STARTED Terbinafine 250 mg daily. STOPPED Aspirin, Dicolfenac, Fluocinolone, Ketoconazole.  ?07/08/20 Dr. Pickard For itching of ear. STARTED Fluocinolone Acetonide 0.01% 1 application apply daily PRN and Ketoconazole 2% 1 application 2 times weekly. STOPPED Atorvastatin.  ?06/30/20 Martinez, Jessica A, NP. For ear pain. STARTED Augmentin 875-125 mg 1 tablet 2 times daily. COMPLETED Doxycycline.  ?  ?Recent consult visits:  ?09/09/20 Aquino, Karem M, MD. For follow-up. Per note: Increase Rivastigmine 1.5mg: Take 1 capsule in AM, 2 capsules in PM for 2 weeks, then increase to 2 capsules twice a day. Send me an update, if no issues, a new prescription for 3mg capsule will be sent. ?  ?Hospital visits: ?09/28/20 [facility] Emergency Department Steinl, Kevin, MD. (8 Hours) For nausea and vomiting. STARTED Meclizine 12.5 mg 3 times daily PRN and Ondansetron 8 mg every 8 hours PRN.  ?09/24/20 [facility] Emergency Dept Plunkett, Whittney, MD. For fall (2 hours) No medication changes.   ?08/31/20  [facility] Emergency Department Zavitz, Joshua, MD. For fall. (4 Hours) No medication changes.  ?  ? ? ?Objective: ? ?Lab Results  ?Component Value Date  ? CREATININE 1.78 (H) 11/18/2020  ? BUN 34 (H) 11/18/2020  ? GFRNONAA 36 (L) 09/28/2020  ? GFRAA 54 (L) 01/19/2020  ? NA 141 11/18/2020  ? K 4.3 11/18/2020  ? CALCIUM   9.5 11/18/2020  ? CO2 30 11/18/2020  ? GLUCOSE 93 11/18/2020  ? ? ?Lab Results  ?Component Value Date/Time  ? HGBA1C 5.5 11/18/2020 09:56 AM  ? HGBA1C 6.5 (H) 07/15/2019 04:37 PM  ? MICROALBUR 86.7 11/18/2020 09:56 AM  ? MICROALBUR 27.1 04/07/2018 08:27 AM  ?  ?Last diabetic Eye exam: No results found for:  HMDIABEYEEXA  ?Last diabetic Foot exam: No results found for: HMDIABFOOTEX  ? ?Lab Results  ?Component Value Date  ? CHOL 159 11/18/2020  ? HDL 67 11/18/2020  ? [place] 78 11/18/2020  ? TRIG 65 11/18/2020  ? CHOLHDL 2.4 11/18/2020  ? ? ?Hepatic Function Latest Ref Rng & Units 11/18/2020 09/28/2020 09/26/2020  ?Total Protein 6.1 - 8.1 g/dL 6.5 7.2 6.4  ?Albumin 3.5 - 5.0 g/dL - 3.9 -  ?AST 10 - 35 U/L 26 44(H) 27  ?ALT 9 - 46 U/L _0 ?Alk Phosphatase 38 - 126 U/L - 97 -  ?Total Bilirubin 0.2 - 1.2 mg/dL 0.5 1.1 0.4  ?Bilirubin, Direct 0.00 - 0.40 mg/dL - - -  ? ? ?Lab Results  ?Component Value Date/Time  ? TSH 0.94 05/06/2017 02:45 PM  ? TSH 1.84 03/29/2016 08:05 AM  ? ? ?CBC Latest Ref Rng & Units 11/18/2020 09/28/2020 08/31/2020  ?WBC 3.8 - 10.8 Thousand/uL 5.1 9.4 5.3  ?Hemoglobin 13.2 - 17.1 g/dL 12.2(L) 12.7(L) 12.1(L)  ?Hematocrit 38.5 - 50.0 % 35.6(L) 39.1 37.0(L)  ?Platelets 140 - 400 Thousand/uL 206 186 182  ? ? ?No results found for: VD25OH ? ?Clinical ASCVD: Yes  ?The ASCVD Risk score (Arnett DK, et al., 2019) failed to calculate for the following reasons: ?  The 2019 ASCVD risk score is only valid for ages 39 to 1 ?  The patient has a prior MI or stroke diagnosis   ? ?Depression screen [facility] 2/9 03/10/2020 11/25/2019 09/23/2019  ?Decreased Interest 0 0 1  ?Down, Depressed, Hopeless 0 0 0  ?PHQ - 2 Score 0 0 1  ?Some recent data might be hidden  ?  ? ?Social History  ? ?Tobacco Use  ?Smoking Status Never  ?Smokeless Tobacco Never  ? ?BP Readings from Last 3 Encounters:  ?03/22/21 123/67  ?03/21/21 (!) 151/60  ?02/13/21 (!) 167/69  ? ?Pulse Readings from Last 3 Encounters:  ?03/22/21 88  ?03/21/21 63  ?02/13/21 78  ? ?Wt Readings from Last 3 Encounters:  ?03/22/21 185 lb (83.9 kg)  ?03/21/21 174 lb (78.9 kg)  ?02/13/21 174 lb (78.9 kg)  ? ?BMI Readings from Last 3 Encounters:  ?03/22/21 28.55 kg/m?  ?03/21/21 28.08 kg/m?  ?02/13/21 27.66 kg/m?  ? ? ?Assessment/Interventions: Review of patient past medical  history, allergies, medications, health status, including review of consultants reports, laboratory and other test data, was performed as part of comprehensive evaluation and provision of chronic care management services.  ? ?SDOH:  (Social Determinants of Health) assessments and interventions performed: Yes ? ?Financial Resource Strain: Low Risk   ? Difficulty of Paying Living Expenses: Not very hard  ? ? ?SDOH Screenings  ? ?Alcohol Screen: Not on file  ?Depression (PHQ2-9): Not on file  ?Financial Resource Strain: Low Risk   ? Difficulty of Paying Living Expenses: Not very hard  ?Food Insecurity: Not on file  ?Housing: Not on file  ?Physical Activity: Not on file  ?Social Connections: Not on file  ?Stress: Not on file  ?Tobacco Use: Low Risk   ? Smoking Tobacco Use: Never  ? Smokeless Tobacco Use: Never  ?  Passive Exposure: Not on file  ?Transportation Needs: Not on file  ? ? ?CCM Care Plan ? ?Allergies  ?Allergen Reactions  ? Gabapentin Other (See Comments)  ?  Visual changes and confusion- "I went blind while I was driving"  ? ? ?Medications Reviewed Today   ? ? Reviewed by Regal, Norman S, DPM (Physician) on 03/27/21 at 1553  Med List Status: <None>  ? ?Medication Order Taking? Sig Documenting Provider Last Dose Status Informant  ?albuterol (PROAIR HFA) 108 (90 Base) MCG/ACT inhaler [id number] No INHALE 2 PUFFS INTO THE LUNGS EVERY 6 HOURS AS NEEDED FOR WHEEZING OR SHORTNESS OF BREATH  ?Patient taking differently: Inhale 2 puffs into the lungs every 6 (six) hours as needed for wheezing or shortness of breath.  ? Angiulli, Daniel J, PA-C Taking Active   ?amLODipine (NORVASC) 5 MG tablet [id number] No Take 1 tablet (5 mg total) by mouth daily. Pt needs to keep upcoming appt in April for further refills Cooper, Michael, MD Taking Active   ?Apoaequorin (PREVAGEN EXTRA STRENGTH PO) [id number] No Take 1 tablet by mouth daily. [provider] Taking Active Spouse/Significant Other  ?atorvastatin (LIPITOR) 20 MG  tablet [id number] No TAKE 1 TABLET BY MOUTH EVERY DAY Pickard, Warren T, MD Taking Active   ?BREO ELLIPTA 200-25 MCG/ACT AEPB [id number] No INHALE 1 PUFF DAILY Pickard, Warren T, MD Taking Active   ?Calcium Carb-Ch

## 2021-04-20 ENCOUNTER — Telehealth: Payer: PPO

## 2021-04-23 ENCOUNTER — Other Ambulatory Visit: Payer: Self-pay | Admitting: Neurology

## 2021-04-24 ENCOUNTER — Telehealth: Payer: Self-pay | Admitting: Family Medicine

## 2021-04-24 ENCOUNTER — Other Ambulatory Visit: Payer: Self-pay | Admitting: Family Medicine

## 2021-04-24 MED ORDER — MOLNUPIRAVIR EUA 200MG CAPSULE: 4.0000 | ORAL_CAPSULE | Freq: Two times a day (BID) | ORAL | 0 refills | Status: AC

## 2021-04-24 NOTE — Telephone Encounter (Signed)
Spoke with patient's wife and relayed rx information. She states patient is taking max dose of Tylenol, advised her not to go over 3k mg per 24 hours. Advised her to try to cut down to 1 tablet per dose when possible. She will try this. Also advised her to contact us if he is not improving, as we can still see him in the office if need be. She voiced understanding to all. Nothing further needed at this time.  ? ?

## 2021-04-24 NOTE — Telephone Encounter (Signed)
Received call from patient's spouse; patient tested positive for COVID yesterday. Requesting Rx for lagevrio to manage symptoms: lot of coughing, sneezing, chills, joint pain, severe headache (tylenol helping some), very weak. Last night patient had incontinance and loose stool.  ? ?Pharmacy confirmed as [facility] on [address].  ? ?Please advise at [phone]. ?

## 2021-05-04 ENCOUNTER — Telehealth: Payer: Self-pay

## 2021-05-04 NOTE — Telephone Encounter (Signed)
Ordered Shoes - [employer] - Jennette Banker [phone] 9.5W ?

## 2021-05-05 ENCOUNTER — Encounter: Payer: Self-pay | Admitting: Cardiovascular Disease

## 2021-05-05 ENCOUNTER — Ambulatory Visit: Payer: PPO | Admitting: Cardiovascular Disease

## 2021-05-05 VITALS — BP 120/70 | HR 68 | Ht 66.0 in | Wt 178.6 lb

## 2021-05-05 DIAGNOSIS — I10 Essential (primary) hypertension: Secondary | ICD-10-CM

## 2021-05-05 DIAGNOSIS — E782 Mixed hyperlipidemia: Secondary | ICD-10-CM

## 2021-05-05 DIAGNOSIS — I2510 Atherosclerotic heart disease of native coronary artery without angina pectoris: Secondary | ICD-10-CM

## 2021-05-05 DIAGNOSIS — N183 Chronic kidney disease, stage 3 unspecified: Secondary | ICD-10-CM

## 2021-05-05 NOTE — Patient Instructions (Signed)
Medication Instructions:  ?Your physician recommends that you continue on your current medications as directed. Please refer to the Current Medication list given to you today. ? ?*If you need a refill on your cardiac medications before your next appointment, please call your pharmacy* ? ? ?Lab Work: ?NONE ?If you have labs (blood work) drawn today and your tests are completely normal, you will receive your results only by: ?MyChart Message (if you have MyChart) OR ?A paper copy in the mail ?If you have any lab test that is abnormal or we need to change your treatment, we will call you to review the results. ? ? ?Testing/Procedures: ?NONE ? ? ?Follow-Up: ?At [facility], you and your health needs are our priority.  As part of our continuing mission to provide you with exceptional heart care, we have created designated Provider Care Teams.  These Care Teams include your primary Cardiologist (physician) and Advanced Practice Providers (APPs -  Physician Assistants and Nurse Practitioners) who all work together to provide you with the care you need, when you need it. ? ? ?Your next appointment:   ?1 year(s) ? ?The format for your next appointment:   ?In Person ? ?Provider:   ?Sherren Mocha, MD   ? ? ?  ?

## 2021-05-05 NOTE — Progress Notes (Signed)
?Cardiology Office Note:   ? ?Date:  05/05/2021  ? ?ID:  [name], DOB January 13, 1934, MRN [id number] ? ?PCP:  [name] Frizzle, MD ?  ?[name] Providers ?Cardiologist:  [name] Mocha, MD    ? ?Referring MD: [name] Frizzle, MD  ? ?Chief Complaint  ?Patient presents with  ? Coronary Artery Disease  ? ? ?History of Present Illness:   ? ?[name] is a 88 y.o. male with a hx of coronary artery disease with history of LAD stenting in 2008. Comorbid conditions include mild bilateral carotid artery stenosis, hypertension, hyperlipidemia, and stage III chronic kidney disease.  Patient underwent PCI with DES to proximal LAD in 2008 with a Cypher DES. He was also noted to have moderate residual coronary disease at that time for which medical therapy was recommended. He has done well since then with no recurrent ischemic events. He did have an exercise treadmill test in 2014 which demonstrated no significant ST/T changes. Patient also has known mild bilateral carotid artery stenosis with most recent carotid dopplers on 11/13/2018 showing 1-39% stenosis of bilateral ICAs. ? ?The patient is here with his wife today. He has done well from a cardiac perspective and has had no recent problems with chest pain, shortness of breath, or heart palpitations.  He has developed dementia since I have seen him last.  He is also having problems with frequent falls and he has had multiple ER visits for this.  Over the past few months he has been getting along pretty well. ? ?Past Medical History:  ?Diagnosis Date  ? Acute blood loss anemia   ? Acute upper respiratory infections of unspecified site 11/04/2012  ? AKI (acute kidney injury)   ? Benign prostatic hyperplasia   ? CAD (coronary artery disease)   ? s/p cypher DES to pLAD 6/08; normal LVF;  ETT-Myoview 2009: no ischemia   ? Clavicle fracture 08/10/2019  ? Closed displaced fracture of phalanx of left thumb, sequela 08/10/2019  ? Coronary atherosclerosis of native  coronary artery 03/08/2008  ? Eosinophilia   ? Essential hypertension   ? Fracture of multiple ribs with pain 08/10/2019  ? History of multiple falls   ? History of SAH (subarachnoid hemorrhage) 07/14/2019  ? Hyperlipidemia type IIB / III 03/08/2008  ? Major neurocognitive disorder due to possible Alzheimer's disease, without behavioral disturbance 02/29/2020  ? MI (myocardial infarction)   ? Moderate persistent asthma   ? 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  ? Imaging revealed SAH  ? Nocturnal leg cramps 11/23/2012  ? Orthostatic hypotension 03/08/2008  ? Pain in joint, shoulder region 11/23/2012  ? Stage 3b chronic kidney disease   ? Thrombocytopenia   ? Trigeminal neuralgia   ? Type 2 diabetes mellitus   ? ? ?Past Surgical History:  ?Procedure Laterality Date  ? APPENDECTOMY    ? CARDIAC CATHETERIZATION  07/30/2006  ? CORONARY ANGIOPLASTY WITH STENT PLACEMENT  ? CARDIAC CATHETERIZATION    ? EXPLORATORY LAPAROTOMY    ? age 64  ? EXPLORATORY LAPAROTOMY    ? IR THORACENTESIS ASP PLEURAL SPACE W/IMG GUIDE  10/05/2019  ? ? ?Current Medications: ?Current Meds  ?Medication Sig  ? albuterol (PROAIR HFA) 108 (90 Base) MCG/ACT inhaler INHALE 2 PUFFS INTO THE LUNGS EVERY 6 HOURS AS NEEDED FOR WHEEZING OR SHORTNESS OF BREATH (Patient taking differently: Inhale 2 puffs into the lungs every 6 (six) hours as needed for wheezing or shortness of breath.)  ? amLODipine (NORVASC)  5 MG tablet Take 1 tablet (5 mg total) by mouth daily. Pt needs to keep upcoming appt in April for further refills  ? Apoaequorin (PREVAGEN EXTRA STRENGTH PO) Take 1 tablet by mouth daily.  ? atorvastatin (LIPITOR) 20 MG tablet TAKE 1 TABLET BY MOUTH EVERY DAY  ? BREO ELLIPTA 200-25 MCG/ACT AEPB INHALE 1 PUFF DAILY  ? Calcium Carb-Cholecalciferol (CALCIUM 600+D3 PO) Take 1 tablet by mouth 2 (two) times daily.  ? doxazosin (CARDURA) 2 MG tablet TAKE 1 TABLET BY MOUTH EVERY DAY  ? escitalopram (LEXAPRO) 10 MG tablet TAKE 1  TABLET BY MOUTH EVERY DAY  ? ezetimibe (ZETIA) 10 MG tablet TAKE 1 TABLET BY MOUTH DAILY. Please keep 05/24/21 appt with Dr. Burt [name] for future refills. Thank you.  ? lisinopril (ZESTRIL) 10 MG tablet Take 1 tablet (10 mg total) by mouth daily.  ? loratadine (CLARITIN) 10 MG tablet Take 10 mg by mouth daily as needed for allergies or rhinitis.   ? meclizine (ANTIVERT) 12.5 MG tablet Take 1 tablet (12.5 mg total) by mouth 3 (three) times daily as needed for dizziness.  ? memantine (NAMENDA) 5 MG tablet TAKE 1/2 TABLET EVERY NIGHT  ? Multiple Vitamin (MULTIVITAMIN WITH MINERALS) TABS tablet Take 1 tablet by mouth daily.  ? ondansetron (ZOFRAN ODT) 8 MG disintegrating tablet Take 1 tablet (8 mg total) by mouth every 8 (eight) hours as needed for nausea or vomiting.  ?  ? ?Allergies:   Gabapentin  ? ?Social History  ? ?Socioeconomic History  ? Marital status: Married  ?  Spouse name: [name]  ? Number of children: 1  ? Years of education: 62  ? Highest education level: Doctorate  ?Occupational History  ? Occupation: retired professor  ?  Comment: History   ? Occupation: missionary  ?Tobacco Use  ? Smoking status: Never  ? Smokeless tobacco: Never  ?Vaping Use  ? Vaping Use: Never used  ?Substance and Sexual Activity  ? Alcohol use: Not Currently  ? Drug use: Never  ? Sexual activity: Yes  ?  Partners: Female  ?Other Topics Concern  ? Not on file  ?Social History Narrative  ? ** Merged History Encounter **  ?    ? Lives with his wife (second marriage in 2015, first marriage ended when his wife died alzheimer's disease). Since his remarriage, his adult daughter doesn't speak with him.  ?   ? Right handed  ? ?Social Determinants of Health  ? ?Financial Resource Strain: Low Risk   ? Difficulty of Paying Living Expenses: Not very hard  ?Food Insecurity: Not on file  ?Transportation Needs: Not on file  ?Physical Activity: Not on file  ?Stress: Not on file  ?Social Connections: Not on file  ?  ? ?Family History: ?The patient's  family history includes Aortic aneurysm in his brother and brother; Arthritis in his daughter; Colon cancer in his brother and brother; Coronary artery disease in his brother; Coronary artery disease (age of onset: 64) in his father; Heart attack in his father; Heart disease in his brother and father; Memory loss in his mother; Stomach cancer in his mother. There is no history of Esophageal cancer or Rectal cancer. ? ?ROS:   ?Please see the history of present illness.    ?All other systems reviewed and are negative. ? ?EKGs/Labs/Other Studies Reviewed:   ? ?The following studies were reviewed today: ?Echo 10/16/19: ? 1. Left ventricular ejection fraction, by estimation, is 50 to 55%. The  ?left ventricle has low  normal function. The left ventricle demonstrates  ?regional wall motion abnormalities (see scoring diagram/findings for  ?description). Left ventricular diastolic  ? parameters are consistent with Grade I diastolic dysfunction (impaired  ?relaxation). There is probable moderate hypokinesis of the left  ?ventricular, basal inferolateral wall. Technically challenging study.  ? 2. Right ventricular systolic function is normal. The right ventricular  ?size is normal. There is normal pulmonary artery systolic pressure.  ? 3. A small pericardial effusion is present. The pericardial effusion is  ?circumferential. There is no evidence of cardiac tamponade.  ? 4. The mitral valve is normal in structure. No evidence of mitral valve  ?regurgitation.  ? 5. The aortic valve is grossly normal. Aortic valve regurgitation is not  ?visualized.  ? 6. The inferior vena cava is normal in size with greater than 50%  ?respiratory variability, suggesting right atrial pressure of 3 mmHg.  ? ?Carotid US 2021: ?Summary:  ?Right Carotid: Velocities in the right ICA are consistent with a 1-39%  ?stenosis.  ? ?Left Carotid: Velocities in the left ICA are consistent with a 1-39%  ?stenosis.  ?              Non-hemodynamically significant  plaque <50% noted in the  ?CCA.  ? ?Vertebrals:  Right vertebral artery demonstrates antegrade flow. Left  ?vertebral  ?             artery demonstrates no discernable flow.  ?Subclavians: Normal flow hem

## 2021-05-08 ENCOUNTER — Encounter: Payer: Self-pay | Admitting: Physician Assistant

## 2021-05-09 ENCOUNTER — Other Ambulatory Visit: Payer: Self-pay | Admitting: Family Medicine

## 2021-05-11 DIAGNOSIS — H9202 Otalgia, left ear: Secondary | ICD-10-CM

## 2021-05-11 HISTORY — DX: Otalgia, left ear: H92.02

## 2021-05-17 ENCOUNTER — Ambulatory Visit: Payer: PPO

## 2021-05-17 DIAGNOSIS — E119 Type 2 diabetes mellitus without complications: Secondary | ICD-10-CM

## 2021-05-17 DIAGNOSIS — M216X1 Other acquired deformities of right foot: Secondary | ICD-10-CM

## 2021-05-17 DIAGNOSIS — M216X2 Other acquired deformities of left foot: Secondary | ICD-10-CM

## 2021-05-17 NOTE — Progress Notes (Signed)
SITUATION ?Reason for Visit: Fitting of Diabetic [place] ?Patient / Caregiver Report:  Patient is satisfied with fit and function of shoes and insoles. ? ?OBJECTIVE DATA: ?Patient History / Diagnosis:   ?  ICD-10-CM   ?1. Type 2 diabetes mellitus without complication, without long-term current use of insulin (HCC)  E11.9   ?  ?2. Other acquired deformities of left foot  M21.6X2   ?  ?3. Other acquired deformities of right foot  M21.6X1   ?  ? ? ?Change in Status:   None ? ?ACTIONS PERFORMED: ?In-Person Delivery, patient was fit with: ?- 1x pair A5500 PDAC approved prefabricated Diabetic Shoes: Apex V751M 9.5W ?- 3x pair E0814 PDAC approved vacuum formed custom diabetic insoles; [employer]: [id number] ? ?Shoes and insoles were verified for structural integrity and safety. Patient wore shoes and insoles in office. Skin was inspected and free of areas of concern after wearing shoes and inserts. Shoes and inserts fit properly. Patient / Caregiver provided with ferbal instruction and demonstration regarding donning, doffing, wear, care, proper fit, function, purpose, cleaning, and use of shoes and insoles ' and in all related precautions and risks and benefits regarding shoes and insoles. Patient / Caregiver was instructed to wear properly fitting socks with shoes at all times. Patient was also provided with verbal instruction regarding how to report any failures or malfunctions of shoes or inserts, and necessary follow up care. Patient / Caregiver was also instructed to contact physician regarding change in status that may affect function of shoes and inserts.  ? ?Patient / Caregiver verbalized undersatnding of instruction provided. Patient / Caregiver demonstrated independence with proper donning and doffing of shoes and inserts. ? ?PLAN ?Patient to follow with treating physician as recommended. Plan of care was discussed with and agreed upon by patient and/or caregiver. All questions were answered and concerns  addressed.  ?

## 2021-05-18 ENCOUNTER — Other Ambulatory Visit: Payer: Self-pay | Admitting: Cardiovascular Disease

## 2021-05-24 ENCOUNTER — Ambulatory Visit: Payer: PPO | Admitting: Cardiovascular Disease

## 2021-05-24 ENCOUNTER — Other Ambulatory Visit: Payer: Self-pay | Admitting: Cardiovascular Disease

## 2021-05-30 ENCOUNTER — Telehealth: Payer: Self-pay | Admitting: Pharmacist

## 2021-05-30 NOTE — Progress Notes (Signed)
Chronic Care Management Pharmacy Assistant   Name: [name]  MRN: [id number] DOB: [birth year]   Reason for Encounter: General Adherence Call    Recent office visits:  12/12/2020 [facility] (PCP) [name], MD; no medication changes indicated.  Recent consult visits:  05/11/2021 OV (Otolaryngology) [place]hour, Ann Lions, PA-C; OTC analgesics/anti-inflammatories as needed, Warm compresses as needed  05/08/2020 Patient Message with Neurology: increase Memantine to 1 tablet nightly.  05/05/2021 [facility] (Cardiology) Sherren Mocha, MD; no medication changes indicated.  04/13/2021 OV (Otolaryngology) [place]hour, Ann Lions, PA-C; no medication changes indicated.  03/30/2021 OV (Otolaryngology) [place]hour, Ann Lions, PA-C; Start ofloxacin drops 5 drops to left ear twice daily, We will plan to recheck in 10 to 14 days for further debridement  03/27/2021 [facility] (Podiatry) Wallene Huh, DPM; no medication changes indicated.  03/22/2021 [facility] (Neurology) Rondel Jumbo, PA-C; no medication changes indicated.  03/17/2021 [facility] (Podiatry) Bronson Ing, DPM; no medication changes indicated.  01/18/2021 OV (Pulmonology) Hunsucker, Bonna Gains, MD; no medication changes indicated.  Hospital visits:  03/21/2021 ED visit for Asymptomatic hypertension  -Rx ciprofloxacin-dexamethasone OTIC suspension 2 times daily for acute otitis externa of right ear  02/13/2021 ED visit for Ear bleeding, left -No medication changes indicated.  Medications: Outpatient Encounter Medications as of 05/30/2021  Medication Sig   albuterol (PROAIR HFA) 108 (90 Base) MCG/ACT inhaler INHALE 2 PUFFS INTO THE LUNGS EVERY 6 HOURS AS NEEDED FOR WHEEZING OR SHORTNESS OF BREATH (Patient taking differently: Inhale 2 puffs into the lungs every 6 (six) hours as needed for wheezing or shortness of breath.)   amLODipine (NORVASC) 5 MG tablet Take 1 tablet (5 mg total) by mouth daily.   Apoaequorin (PREVAGEN EXTRA  STRENGTH PO) Take 1 tablet by mouth daily.   atorvastatin (LIPITOR) 20 MG tablet TAKE 1 TABLET BY MOUTH EVERY DAY   BREO ELLIPTA 200-25 MCG/ACT AEPB INHALE 1 PUFF DAILY   Calcium Carb-Cholecalciferol (CALCIUM 600+D3 PO) Take 1 tablet by mouth 2 (two) times daily.   doxazosin (CARDURA) 2 MG tablet TAKE 1 TABLET BY MOUTH EVERY DAY   escitalopram (LEXAPRO) 10 MG tablet TAKE 1 TABLET BY MOUTH EVERY DAY   ezetimibe (ZETIA) 10 MG tablet TAKE 1 TABLET BY MOUTH DAILY.   lisinopril (ZESTRIL) 10 MG tablet Take 1 tablet (10 mg total) by mouth daily.   loratadine (CLARITIN) 10 MG tablet Take 10 mg by mouth daily as needed for allergies or rhinitis.    meclizine (ANTIVERT) 12.5 MG tablet Take 1 tablet (12.5 mg total) by mouth 3 (three) times daily as needed for dizziness.   memantine (NAMENDA) 5 MG tablet TAKE 1/2 TABLET EVERY NIGHT   Multiple Vitamin (MULTIVITAMIN WITH MINERALS) TABS tablet Take 1 tablet by mouth daily.   ondansetron (ZOFRAN ODT) 8 MG disintegrating tablet Take 1 tablet (8 mg total) by mouth every 8 (eight) hours as needed for nausea or vomiting.   terbinafine (LAMISIL) 250 MG tablet Take 1 tablet (250 mg total) by mouth daily. (Patient not taking: Reported on 05/05/2021)   No facility-administered encounter medications on file as of 05/30/2021.   Patient Questions: Have you had any problems recently with your health?  Have you had any problems with your pharmacy?  What issues or side effects are you having with your medications?  What would you like me to pass along to Leata Mouse, CPP for him to help you with?   What can we do to take care of you better?  **Unsuccessful attempt  at reaching patient**  Care Gaps: Medicare Annual Wellness: Declined Ophthalmology Exam: Next due on 06/14/2021 0Hemoglobin A1C: 5.5% on 11/18/2020 Colonoscopy: Completed on 04/16/2014  Future Appointments  Date Time Provider [place]  06/19/2021  9:15 AM Bronson Ing, DPM [facility]  08/31/2021  8:30 AM Hazle Coca, PhD [facility] None  08/31/2021  9:30 AM [facility] TE[facility] None  09/07/2021 10:00 AM Hazle Coca, PhD [facility] None  09/14/2021  9:00 AM Rondel Jumbo, PA-C [facility] None   Star Rating Drugs: Atorvastatin 20 mg last filled 05/18/2021 90 DS Lisinopril 10 mg last filled 05/18/2021 90 DS  April D Calhoun, [place] Pharmacist Assistant [phone]

## 2021-06-07 ENCOUNTER — Telehealth: Payer: Self-pay

## 2021-06-07 DIAGNOSIS — J449 Chronic obstructive pulmonary disease, unspecified: Secondary | ICD-10-CM | POA: Diagnosis not present

## 2021-06-07 NOTE — Telephone Encounter (Signed)
Pt's wife, [name], called.   ? ?Per Wife, [facility] Wants to prescribe pt w/4 new meds due to his breathing while sleeping ie rattling sounds.  ? ?Per wife, believe they are treating pt for bronchitis. She wants to check with you to see if pt does not have pneumonia?  ? ?Wife, trust your decision on what is best to do because she feels like you knows pt best.  ? ?Please advice ?

## 2021-06-07 NOTE — Telephone Encounter (Signed)
Syna Barber/NP with [facility], called to inform Dr. Dennard Schaumann of pt's home visit today.  ? ?Per NP, pt is being treated for Bronchitis. Pt is on antibiotics-Prednisone, Musinex, albuterol-inhaler and tylenol.  ?Will forward report as well.  ? ?Any questions please call [phone] ?

## 2021-06-09 ENCOUNTER — Encounter: Payer: Self-pay | Admitting: Neurology

## 2021-06-09 ENCOUNTER — Other Ambulatory Visit: Payer: Self-pay

## 2021-06-09 MED ORDER — MEMANTINE HCL 5 MG PO TABS: 5.0000 mg | ORAL_TABLET | Freq: Every evening | ORAL | 0 refills | Status: DC

## 2021-06-09 NOTE — Telephone Encounter (Signed)
Spoke with pt's wife, per wife, she thinks that right he seems to be doing fine. She will call if need be.  ?

## 2021-06-14 DIAGNOSIS — J449 Chronic obstructive pulmonary disease, unspecified: Secondary | ICD-10-CM | POA: Diagnosis not present

## 2021-06-19 ENCOUNTER — Ambulatory Visit: Payer: PPO | Admitting: Podiatrist

## 2021-06-19 ENCOUNTER — Encounter: Payer: Self-pay | Admitting: Podiatrist

## 2021-06-19 DIAGNOSIS — B351 Tinea unguium: Secondary | ICD-10-CM | POA: Diagnosis not present

## 2021-06-19 DIAGNOSIS — E119 Type 2 diabetes mellitus without complications: Secondary | ICD-10-CM | POA: Diagnosis not present

## 2021-06-19 DIAGNOSIS — M79609 Pain in unspecified limb: Secondary | ICD-10-CM | POA: Diagnosis not present

## 2021-06-19 NOTE — Progress Notes (Signed)
Subjective: ?[name] is a 88 y.o. male patient who presents to office today complaining of long,mildly painful nails  while ambulating in shoes; unable to trim.  Patient denies any new changes in medication or new problems. Patient denies any new cramping, numbness, burning or tingling in the legs or feet. ? ?Patient Active Problem List  ? Diagnosis Date Noted  ? Otorrhagia of left ear 03/30/2021  ? Dizziness 03/08/2020  ? Major neurocognitive disorder due to possible Alzheimer's disease, without behavioral disturbance 02/29/2020  ? CAD (coronary artery disease)   ? MI (myocardial infarction)   ? Trigeminal neuralgia   ? 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   ? Multiple trauma   ? Benign prostatic hyperplasia   ? Essential hypertension   ? Stage 3b chronic kidney disease ([place])   ? Thrombocytopenia   ? History of multiple falls 07/14/2019  ? History of SAH (subarachnoid hemorrhage) 07/14/2019  ? Type 2 diabetes mellitus   ? Eosinophilia   ? Nocturnal leg cramps 11/23/2012  ? Pain in joint, shoulder region 11/23/2012  ? Hematoma 11/04/2012  ? Hyperlipidemia type IIB / III 03/08/2008  ? Orthostatic hypotension 03/08/2008  ? Coronary atherosclerosis of native coronary artery 03/08/2008  ? ?Current Outpatient Medications on File Prior to Visit  ?Medication Sig Dispense Refill  ? albuterol (PROAIR HFA) 108 (90 Base) MCG/ACT inhaler INHALE 2 PUFFS INTO THE LUNGS EVERY 6 HOURS AS NEEDED FOR WHEEZING OR SHORTNESS OF BREATH (Patient taking differently: Inhale 2 puffs into the lungs every 6 (six) hours as needed for wheezing or shortness of breath.) 6.7 g 0  ? amLODipine (NORVASC) 5 MG tablet Take 1 tablet (5 mg total) by mouth daily. 90 tablet 3  ? Apoaequorin (PREVAGEN EXTRA STRENGTH PO) Take 1 tablet by mouth daily.    ? atorvastatin (LIPITOR) 20 MG tablet TAKE 1 TABLET BY MOUTH EVERY DAY 90 tablet 1  ? BREO ELLIPTA 200-25 MCG/ACT AEPB INHALE 1 PUFF DAILY 60 each 1  ? Calcium  Carb-Cholecalciferol (CALCIUM 600+D3 PO) Take 1 tablet by mouth 2 (two) times daily.    ? doxazosin (CARDURA) 2 MG tablet TAKE 1 TABLET BY MOUTH EVERY DAY 90 tablet 3  ? escitalopram (LEXAPRO) 10 MG tablet TAKE 1 TABLET BY MOUTH EVERY DAY 90 tablet 2  ? ezetimibe (ZETIA) 10 MG tablet TAKE 1 TABLET BY MOUTH DAILY. 90 tablet 3  ? lisinopril (ZESTRIL) 10 MG tablet Take 1 tablet (10 mg total) by mouth daily. 90 tablet 3  ? loratadine (CLARITIN) 10 MG tablet Take 10 mg by mouth daily as needed for allergies or rhinitis.     ? meclizine (ANTIVERT) 12.5 MG tablet Take 1 tablet (12.5 mg total) by mouth 3 (three) times daily as needed for dizziness. 15 tablet 0  ? memantine (NAMENDA) 5 MG tablet Take 1 tablet (5 mg total) by mouth at bedtime. 90 tablet 0  ? Multiple Vitamin (MULTIVITAMIN WITH MINERALS) TABS tablet Take 1 tablet by mouth daily.    ? ondansetron (ZOFRAN ODT) 8 MG disintegrating tablet Take 1 tablet (8 mg total) by mouth every 8 (eight) hours as needed for nausea or vomiting. 10 tablet 0  ? terbinafine (LAMISIL) 250 MG tablet Take 1 tablet (250 mg total) by mouth daily. (Patient not taking: Reported on 05/05/2021) 90 tablet 3  ? ?No current facility-administered medications on file prior to visit.  ? ?Allergies  ?Allergen Reactions  ? Gabapentin Other (See Comments)  ?  Visual changes and  confusion- "I went blind while I was driving"  ? ? ? ?Objective: ?General: Patient is awake, alert, and oriented x 3 and in no acute distress. ? ?Integument: Skin is warm, dry and supple bilateral. Nails are tender, long, thickened and  ?dystrophic with subungual debris, consistent with onychomycosis, 1-5 bilateral. No signs of infection. Dark scab present medial border of the right hallux nail where an ingrown was removed in the past.  No redness or drainage noted.  No pain with pressure on the medial border noted.  No open lesions or preulcerative lesions present bilateral. Remaining integument unremarkable. ? ?Vasculature:   Dorsalis Pedis pulse 1/4 bilateral. Posterior Tibial pulse  1/4 bilateral.  ?Capillary fill time <3 sec 1-5 bilateral. Positive hair growth to the level of the digits. ?Temperature gradient within normal limits. No varicosities present bilateral. No edema present bilateral.  ? ?Neurology: The patient has intact sensation measured with a 5.07/10g Semmes Weinstein Monofilament at 3/5 pedal sites bilateral . Vibratory sensation diminished bilateral with tuning fork. No Babinski sign present bilateral.  ? ?Musculoskeletal: No symptomatic pedal deformities noted bilateral. Muscular strength 5/5 in all lower extremity muscular groups bilateral without pain on range of motion . No tenderness with calf compression bilateral. ? ?Assessment and Plan: ?  ICD-10-CM   ?1. Type 2 diabetes mellitus without complication, without long-term current use of insulin (HCC)  E11.9   ?  ?2. Pain due to onychomycosis of nail  B35.1   ? M79.609   ?  ? ? ? ?-Examined patient. ?-Mechanically debrided all nails 1-5 bilateral using sterile nail nipper and filed with dremel without incident  ?-Answered all patient questions ?-Patient to return  in 3 months for continued foot care.  ?-Patient advised to call the office if any problems or questions arise in the meantime. ? ?Bronson Ing, DPM ?

## 2021-06-20 DIAGNOSIS — D692 Other nonthrombocytopenic purpura: Secondary | ICD-10-CM | POA: Diagnosis not present

## 2021-06-20 DIAGNOSIS — Z8709 Personal history of other diseases of the respiratory system: Secondary | ICD-10-CM | POA: Diagnosis not present

## 2021-07-06 DIAGNOSIS — G309 Alzheimer's disease, unspecified: Secondary | ICD-10-CM | POA: Diagnosis not present

## 2021-07-06 DIAGNOSIS — G20 Parkinson's disease: Secondary | ICD-10-CM | POA: Diagnosis not present

## 2021-07-06 DIAGNOSIS — F0280 Dementia in other diseases classified elsewhere without behavioral disturbance: Secondary | ICD-10-CM | POA: Diagnosis not present

## 2021-07-14 DIAGNOSIS — L299 Pruritus, unspecified: Secondary | ICD-10-CM

## 2021-07-14 HISTORY — DX: Pruritus, unspecified: L29.9

## 2021-07-18 ENCOUNTER — Telehealth: Payer: Self-pay | Admitting: Pharmacist

## 2021-07-18 NOTE — Progress Notes (Signed)
Chronic Care Management Pharmacy Assistant   Name: [name]  MRN: [id number] DOB: 05-21-1933   Reason for Encounter: Hypertension Adherence Call    Recent office visits:  12/12/2020 OV (PCP) [name] Frizzle, MD; no medication changes indicated.  Recent consult visits:  06/19/2021 OV (Podiatry) [name], DPM; no medication changes indicated.  05/11/2021 OV (Otolaryngology) Spainhour, [name] Lions, PA-C; OTC analgesics/anti-inflammatories as needed, Warm compresses as needed   05/08/2020 Patient Message with Neurology: increase Memantine to 1 tablet nightly.   05/05/2021 OV (Cardiology) [name] Mocha, MD; no medication changes indicated.   04/13/2021 OV (Otolaryngology) Spainhour, [name] Lions, PA-C; no medication changes indicated.   03/30/2021 OV (Otolaryngology) Spainhour, [name] Lions, PA-C; Start ofloxacin drops 5 drops to left ear twice daily, We will plan to recheck in 10 to 14 days for further debridement   03/27/2021 OV (Podiatry) [name], DPM; no medication changes indicated.   03/22/2021 OV (Neurology) [name] Jumbo, PA-C; no medication changes indicated.   03/17/2021 OV (Podiatry) [name], DPM; no medication changes indicated.   01/18/2021 OV (Pulmonology) Hunsucker, Bonna Gains, MD; no medication changes indicated  Hospital visits:  03/21/2021 ED visit for Asymptomatic hypertension  -Rx ciprofloxacin-dexamethasone OTIC suspension 2 times daily for acute otitis externa of right ear   02/13/2021 ED visit for Ear bleeding, left -No medication changes indicated.  Medications: Outpatient Encounter Medications as of 07/18/2021  Medication Sig   albuterol (PROAIR HFA) 108 (90 Base) MCG/ACT inhaler INHALE 2 PUFFS INTO THE LUNGS EVERY 6 HOURS AS NEEDED FOR WHEEZING OR SHORTNESS OF BREATH (Patient taking differently: Inhale 2 puffs into the lungs every 6 (six) hours as needed for wheezing or shortness of breath.)   amLODipine  (NORVASC) 5 MG tablet Take 1 tablet (5 mg total) by mouth daily.   Apoaequorin (PREVAGEN EXTRA STRENGTH PO) Take 1 tablet by mouth daily.   atorvastatin (LIPITOR) 20 MG tablet TAKE 1 TABLET BY MOUTH EVERY DAY   BREO ELLIPTA 200-25 MCG/ACT AEPB INHALE 1 PUFF DAILY   Calcium Carb-Cholecalciferol (CALCIUM 600+D3 PO) Take 1 tablet by mouth 2 (two) times daily.   doxazosin (CARDURA) 2 MG tablet TAKE 1 TABLET BY MOUTH EVERY DAY   escitalopram (LEXAPRO) 10 MG tablet TAKE 1 TABLET BY MOUTH EVERY DAY   ezetimibe (ZETIA) 10 MG tablet TAKE 1 TABLET BY MOUTH DAILY.   lisinopril (ZESTRIL) 10 MG tablet Take 1 tablet (10 mg total) by mouth daily.   loratadine (CLARITIN) 10 MG tablet Take 10 mg by mouth daily as needed for allergies or rhinitis.    meclizine (ANTIVERT) 12.5 MG tablet Take 1 tablet (12.5 mg total) by mouth 3 (three) times daily as needed for dizziness.   memantine (NAMENDA) 5 MG tablet Take 1 tablet (5 mg total) by mouth at bedtime.   Multiple Vitamin (MULTIVITAMIN WITH MINERALS) TABS tablet Take 1 tablet by mouth daily.   ondansetron (ZOFRAN ODT) 8 MG disintegrating tablet Take 1 tablet (8 mg total) by mouth every 8 (eight) hours as needed for nausea or vomiting.   terbinafine (LAMISIL) 250 MG tablet Take 1 tablet (250 mg total) by mouth daily. (Patient not taking: Reported on 05/05/2021)   No facility-administered encounter medications on file as of 07/18/2021.   Reviewed chart prior to disease state call. Spoke with patient regarding BP  Recent Office Vitals: BP Readings from Last 3 Encounters:  05/05/21 120/70  03/22/21 123/67  03/21/21 (!) 151/60   Pulse Readings from Last 3 Encounters:  05/05/21 68  03/22/21 88  03/21/21 63    Wt Readings from Last 3 Encounters:  05/05/21 178 lb 9.6 oz (81 kg)  03/22/21 185 lb (83.9 kg)  03/21/21 174 lb (78.9 kg)     Kidney Function Lab Results  Component Value Date/Time   CREATININE 1.78 (H) 11/18/2020 09:56 AM   CREATININE 1.80 (H)  09/28/2020 02:44 PM   CREATININE 1.98 (H) 09/26/2020 11:54 AM   GFRNONAA 36 (L) 09/28/2020 02:44 PM   GFRNONAA 47 (L) 01/19/2020 04:21 PM   GFRAA 54 (L) 01/19/2020 04:21 PM       Latest Ref Rng & Units 11/18/2020    9:56 AM 09/28/2020    2:44 PM 09/26/2020   11:54 AM  BMP  Glucose 65 - 99 mg/dL 93  155  109   BUN 7 - 25 mg/dL 34  33  34   Creatinine 0.70 - 1.22 mg/dL 1.78  1.80  1.98   BUN/Creat Ratio 6 - 22 (calc) 19   17   Sodium 135 - 146 mmol/L 141  137  142   Potassium 3.5 - 5.3 mmol/L 4.3  3.6  4.2   Chloride 98 - 110 mmol/L 104  103  104   CO2 20 - 32 mmol/L '30  25  30   '$ Calcium 8.6 - 10.3 mg/dL 9.5  9.0  9.0     Current antihypertensive regimen:  Amlodipine 5 mg daily Lisinopril 10 mg daily  How often are you checking your Blood Pressure? 1-2x per week  Current home BP readings: 135/62 144/73, 105/60  What recent interventions/DTPs have been made by any provider to improve Blood Pressure control since last CPP Visit: No recent interventions or DTPs.  Any recent hospitalizations or ED visits since last visit with CPP? No  What diet changes have been made to improve Blood Pressure Control?  "It could be better."  What exercise is being done to improve your Blood Pressure Control?  "Not as much as he needs to."  Adherence Review: Is the patient currently on ACE/ARB medication? Yes Does the patient have >5 day gap between last estimated fill dates? No  Patient schedule a face to face follow up appt with the pharmacist on 08/03/2021 at 9:30 am.  Care Gaps: Medicare Annual Wellness: Declined Ophthalmology Exam: Next due on 06/14/2021 Hemoglobin A1C: 5.5% on 11/18/2020 Colonoscopy: Completed on 04/16/2014  Future Appointments  Date Time Provider [name]  08/31/2021  8:30 AM [name] Coca, PhD [facility] None  08/31/2021  9:30 AM [facility] TECH [facility] None  09/07/2021 10:00 AM [name] Coca, PhD [facility] None  09/14/2021  9:00 AM [name] Jumbo, PA-C  [facility] None  09/25/2021  9:15 AM Marzetta Board, DPM [facility]   Star Rating Drugs: Atorvastatin 20 mg last filled 05/18/2021 90 DS Lisinopril 10 mg last filled 05/18/2021 90 DS  [name], [place] Pharmacist Assistant [phone]

## 2021-07-24 NOTE — Progress Notes (Signed)
Chronic Care Management Pharmacy Note  08/03/2021 Name:  [name] MRN:  [id number] DOB:  November 25, 1933  Summary: [name] visit with PharmD.  Meds reviewed/updated.   No concerns with affordability.  He is due for regular lab workup.  They were also concerned about elevated BP.  Upon review of logs all were < 140/90 except for two in the last two weeks.  Recommended they continue to record, to be sitting up with feet on the floor when checking.  Recommendations/Changes made from today's visit: Recheck A1c and lipids Patient due for foot exam, eye exam  Plan: FU PharmD 6 months via telephone CMA to call in 30 days for BP   Subjective: [name] is an 88 y.o. year old male who is a primary patient of Pickard, Cammie Mcgee, MD.  The CCM team was consulted for assistance with disease management and care coordination needs.    Engaged with patient by telephone for initial visit in response to provider referral for pharmacy case management and/or care coordination services.   Consent to Services:  The patient was given the following information about Chronic Care Management services today, agreed to services, and gave verbal consent: 1. CCM service includes personalized support from designated clinical staff supervised by the primary care provider, including individualized plan of care and coordination with other care providers 2. 24/7 contact phone numbers for assistance for urgent and routine care needs. 3. Service will only be billed when office clinical staff spend 20 minutes or more in a month to coordinate care. 4. Only one practitioner may furnish and bill the service in a calendar month. 5.The patient may stop CCM services at any time (effective at the end of the month) by phone call to the office staff. 6. The patient will be responsible for cost sharing (co-pay) of up to 20% of the service fee (after annual deductible is met). Patient agreed to services and consent  obtained.  Patient Care Team: [name], MD as PCP - General (Family Medicine) Sherren Mocha, MD as PCP - Cardiology (Cardiology) Audery Amel Sharma Covert, [facility] (Inactive) as Pharmacist (Pharmacist) [name], MD (Family Medicine) Cameron Sprang, MD as Consulting Physician (Neurology) Edythe Clarity, [facility] as Pharmacist (Pharmacist) Recent office visits:  10/18/20 Dr. Dennard Schaumann For fever, illness. STARTED Molnupiravir 800 mg 2 times daily.  08/22/20 Dr. Dennard Schaumann For skin discoloration STARTED Terbinafine 250 mg daily. STOPPED Aspirin, Dicolfenac, Fluocinolone, Ketoconazole.  07/08/20 Dr. Dennard Schaumann For itching of ear. STARTED Fluocinolone Acetonide 9.79% 1 application apply daily PRN and Ketoconazole 2% 1 application 2 times weekly. STOPPED Atorvastatin.  06/30/20 Eulogio Bear, NP. For ear pain. STARTED Augmentin 875-125 mg 1 tablet 2 times daily. COMPLETED Doxycycline.    Recent consult visits:  09/09/20 Chase Picket, MD. For follow-up. Per note: Increase Rivastigmine 1.18m: Take 1 capsule in AM, 2 capsules in PM for 2 weeks, then increase to 2 capsules twice a day. Send me an update, if no issues, a new prescription for 353mcapsule will be sent.   [facility] Saver[facility]. (8 Hours) For nausea and vomiting. STARTED Meclizine 12.5 mg 3 times daily PRN and Ondansetron 8 mg every 8 hours PRN.  [facility]20 [facility] Pl[facility]el JumboMD. For fall (2 hours) No medication changes.   [facility] Za[facility]ln[facility] MorrisonMD. For fall. (4 Hours) No medication changes.      Objective:  Lab Results  Component Value Date   CREATININE 1.78 (H) 11/18/2020   BUN 34 (H) 11/18/2020   GFRNONAA 36 (L) 09/28/2020   GFRAA 54 (L) 01/19/2020   NA 141 11/18/2020   K 4.3 11/18/2020   CALCIUM 9.5 11/18/2020   CO2 30 11/18/2020   GLUCOSE 93 11/18/2020    Lab  Results  Component Value Date/Time   HGBA1C 5.5 11/18/2020 09:56 AM   HGBA1C 6.5 (H) 07/15/2019 04:37 PM   MICROALBUR 86.7 11/18/2020 09:56 AM   MICROALBUR 27.1 04/07/2018 08:27 AM    Last diabetic Eye exam: No results found for: "HMDIABEYEEXA"  Last diabetic Foot exam: No results found for: "HMDIABFOOTEX"   Lab Results  Component Value Date   CHOL 159 11/18/2020   HDL 67 11/18/2020   LDLCALC 78 11/18/2020   TRIG 65 11/18/2020   CHOLHDL 2.4 11/18/2020       Latest Ref Rng & Units 11/18/2020    9:56 AM 09/28/2020    2:44 PM 09/26/2020   11:54 AM  Hepatic Function  Total Protein 6.1 - 8.1 g/dL 6.5  7.2  6.4   Albumin 3.5 - 5.0 g/dL  3.9    AST 10 - 35 U/L 26  44  27   ALT 9 - 46 U/L _0 Alk Phosphatase 38 - 126 U/L  97    Total Bilirubin 0.2 - 1.2 mg/dL 0.5  1.1  0.4     Lab Results  Component Value Date/Time   TSH 0.94 05/06/2017 02:45 PM   TSH 1.84 03/29/2016 08:05 AM       Latest Ref Rng & Units 11/18/2020    9:56 AM 09/28/2020    2:44 PM 08/31/2020   11:31 AM  CBC  WBC 3.8 - 10.8 Thousand/uL 5.1  9.4  5.3   Hemoglobin 13.2 - 17.1 g/dL 12.2  12.7  12.1   Hematocrit 38.5 - 50.0 % 35.6  39.1  37.0   Platelets 140 - 400 Thousand/uL 206  186  182     No results found for: "VD25OH"  Clinical ASCVD: Yes  The ASCVD Risk score (Arnett DK, et al., 2019) failed to calculate for the following reasons:   The 2019 ASCVD risk score is only valid for ages 33 to 58   The patient has a prior MI or stroke diagnosis       03/10/2020    3:26 PM 11/25/2019    3:06 PM 09/23/2019    9:43 AM  Depression screen PHQ 2/9  Decreased Interest 0 0 1  Down, Depressed, Hopeless 0 0 0  PHQ - 2 Score 0 0 1     Social History   Tobacco Use  Smoking Status Never  Smokeless Tobacco Never   BP Readings from Last 3 Encounters:  05/05/21 120/70  03/22/21 123/67  03/21/21 (!) 151/60   Pulse Readings from Last 3 Encounters:  05/05/21 68  03/22/21 88  03/21/21 63   Wt  Readings from Last 3 Encounters:  05/05/21 178 lb 9.6 oz (81 kg)  03/22/21 185 lb (83.9 kg)  03/21/21 174 lb (78.9 kg)   BMI Readings from Last 3 Encounters:  05/05/21 28.83 kg/m  03/22/21 28.55 kg/m  03/21/21 28.08 kg/m    Assessment/Interventions: Review of patient past medical history, allergies, medications, health status, including review of consultants reports, laboratory and other test data, was performed as part of comprehensive evaluation and provision of chronic care management services.   SDOH:  (  Social Determinants of Health) assessments and interventions performed: Yes  Financial Resource Strain: Low Risk  (11/11/2020)   Overall Financial Resource Strain (CARDIA)    Difficulty of Paying Living Expenses: Not very hard    SDOH Screenings   Alcohol Screen: Low Risk  (04/07/2018)   Alcohol Screen    Last Alcohol Screening Score (AUDIT): 1  Depression (PHQ2-9): Low Risk  (03/10/2020)   Depression (PHQ2-9)    PHQ-2 Score: 0  Financial Resource Strain: Low Risk  (11/11/2020)   Overall Financial Resource Strain (CARDIA)    Difficulty of Paying Living Expenses: Not very hard  Food Insecurity: Not on file  Housing: Not on file  Physical Activity: Not on file  Social Connections: Not on file  Stress: Not on file  Tobacco Use: Low Risk  (06/19/2021)   Patient History    Smoking Tobacco Use: Never    Smokeless Tobacco Use: Never    Passive Exposure: Not on file  Transportation Needs: Not on file    [place]  Allergies  Allergen Reactions   Gabapentin Other (See Comments)    Visual changes and confusion- "I went blind while I was driving"    Medications Reviewed Today     Reviewed by Edythe Clarity, [facility] (Pharmacist) on 08/03/21 at 1126  Med List Status: <None>   Medication Order Taking? Sig Documenting Provider Last Dose Status Informant  albuterol (PROAIR HFA) 108 (90 Base) MCG/ACT inhaler [id number] Yes INHALE 2 PUFFS INTO THE LUNGS EVERY 6 HOURS AS  NEEDED FOR WHEEZING OR SHORTNESS OF BREATH  Patient taking differently: Inhale 2 puffs into the lungs every 6 (six) hours as needed for wheezing or shortness of breath.   Cathlyn Parsons, PA-C Taking Active   amLODipine (NORVASC) 5 MG tablet [id number] Yes Take 1 tablet (5 mg total) by mouth daily. Sherren Mocha, MD Taking Active   Apoaequorin [facility] EXTRA STRENGTH PO) [id number] Yes Take 1 tablet by mouth daily. [provider] Taking Active Spouse/Significant Other  atorvastatin (LIPITOR) 20 MG tablet [id number] Yes TAKE 1 TABLET BY MOUTH EVERY DAY [name], MD Taking Active   BREO ELLIPTA 200-25 MCG/ACT AEPB [id number] Yes INHALE 1 PUFF DAILY [name], MD Taking Active   Calcium Carb-Cholecalciferol (CALCIUM 600+D3 PO) [id number] Yes Take 1 tablet by mouth 2 (two) times daily. [provider] Taking Active Spouse/Significant Other  doxazosin (CARDURA) 2 MG tablet [id number] Yes TAKE 1 TABLET BY MOUTH EVERY DAY [name], MD Taking Active   escitalopram (LEXAPRO) 10 MG tablet [id number] Yes TAKE 1 TABLET BY MOUTH EVERY DAY [name], MD Taking Active   ezetimibe (ZETIA) 10 MG tablet [id number] Yes TAKE 1 TABLET BY MOUTH DAILY. Sherren Mocha, MD Taking Active   lisinopril (ZESTRIL) 10 MG tablet [id number] Yes Take 1 tablet (10 mg total) by mouth daily. [name], MD Taking Active   loratadine (CLARITIN) 10 MG tablet [id number] Yes Take 10 mg by mouth daily as needed for allergies or rhinitis.  [provider] Taking Active Spouse/Significant Other  meclizine (ANTIVERT) 12.5 MG tablet [id number] Yes Take 1 tablet (12.5 mg total) by mouth 3 (three) times daily as needed for dizziness. Lajean Saver, MD Taking Active   memantine [facility]) 5 MG tablet [id number] Yes Take 1 tablet (5 mg total) by mouth at bedtime. Cameron Sprang, MD Taking Active   Multiple Vitamin (MULTIVITAMIN WITH MINERALS) TABS tablet [id number] Yes Take 1 tablet by  mouth daily. [provider]  Taking Active Spouse/Significant Other  ondansetron (ZOFRAN ODT) 8 MG disintegrating tablet [id number] Yes Take 1 tablet (8 mg total) by mouth every 8 (eight) hours as needed for nausea or vomiting. Lajean Saver, MD Taking Active   terbinafine (LAMISIL) 250 MG tablet [id number] Yes Take 1 tablet (250 mg total) by mouth daily. [name], MD Taking Active             Patient Active Problem List   Diagnosis Date Noted   Otorrhagia of left ear 03/30/2021   Dizziness 03/08/2020   Major neurocognitive disorder due to possible Alzheimer's disease, without behavioral disturbance 02/29/2020   CAD (coronary artery disease)    MI (myocardial infarction)    Trigeminal neuralgia    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    Multiple trauma    Benign prostatic hyperplasia    Essential hypertension    Stage 3b chronic kidney disease (HCC)    Thrombocytopenia    History of multiple falls 07/14/2019   History of SAH (subarachnoid hemorrhage) 07/14/2019   Type 2 diabetes mellitus    Eosinophilia    Nocturnal leg cramps 11/23/2012   Pain in joint, shoulder region 11/23/2012   Hematoma 11/04/2012   Hyperlipidemia type IIB / III 03/08/2008   Orthostatic hypotension 03/08/2008   Coronary atherosclerosis of native coronary artery 03/08/2008    Immunization History  Administered Date(s) Administered   Fluad Quad(high Dose 65+) 11/10/2020   Influenza Split 10/06/2012   Influenza, High Dose Seasonal PF 12/02/2016, 10/26/2018, 10/27/2018, 10/23/2019   Influenza,inj,Quad PF,6+ Mos 11/12/2013, 11/11/2014, 10/20/2015, 11/12/2017   Moderna Sars-Covid-2 Vaccination 03/04/2019, 04/03/2019   Pfizer Covid-19 Vaccine Bivalent Booster 55yr & up 11/10/2020   Pneumococcal Conjugate-13 03/17/2013   Pneumococcal Polysaccharide-23 02/05/2001   Td 03/01/2009   Zoster, Live 02/25/2008    Conditions to be addressed/monitored:  CAD, Type II DM,  HLD, HTN  Care Plan : General Pharmacy (Adult)  Updates made by DEdythe Clarity RPH since 08/03/2021 12:00 AM     Problem: CAD, Type II DM, HLD, HTN   Priority: High  Onset Date: 11/10/2020     Goal: Patient-Specific Goal   Note:   Current Barriers:  Borderline LDL, memory disturbances  Pharmacist Clinical Goal(s):  Patient will achieve improvement in A1c and lipids as evidenced by labs through collaboration with PharmD and provider.   Interventions: 1:1 collaboration with PSusy Frizzle MD regarding development and update of comprehensive plan of care as evidenced by provider attestation and co-signature Inter-disciplinary care team collaboration (see longitudinal plan of care) Comprehensive medication review performed; medication list updated in electronic medical record  Hypertension (BP goal <140/90) 08/03/21 -Controlled -Current treatment: Amlodipine 520mdaily  Appropriate, Effective, Safe, Accessible Doxazosin 48m67maily Appropriate, Effective, Safe, Accessible Lisinopril 67m5mily Appropriate, Effective, Safe, Accessible -Medications previously tried: benazepril  -Current home readings: a few readings > 140/90 the majority were less. -Current exercise habits: some walking and weigh bearing exercise -Educated on BP goals and benefits of medications for prevention of heart attack, stroke and kidney damage; Exercise goal of 150 minutes per week; Symptoms of hypotension and importance of maintaining adequate hydration; -Counseled to monitor BP at home periodically, document, and provide log at future appointments -Wife brought in BP logs, however, the night time readings they were checking while he was laying down. Counseled on appropriate methods for checking blood pressure.  They are going to start checking twice daily and recording.  He has reported a few dizzy spells but  this has been when he gets up from laying down and immediately starts walking.  Recommended he  slow down before starting to walk. -Recommended to continue current medication CMA to check in about 30 days on BP  11/10/20 -Reports hypotensive/hypertensive symptoms - dizziness, however he is being treated for Vertigo at the moment doing PT for Epley maneuver and it has been beneficial so far -Recommended to continue current medication  Neurocognitive disorder (Goal: Prevent progression) 08/03/21 -Controlled -Current treatment  Memantine 5m once daily Appropriate, Effective, Safe, Accessible -Medications previously tried: donepezil -Denies any GI effects with this medication, concerned it was increasing BP.  I am ok with current BP, however I do want to check after they start monitoring while he is sitting up.  They may slowly taper up as he has been sensitive to dose increases in other classes of these medications.  -Recommended to continue current medication Will call in 30 days to assess BP, no changes needed at this time.  Hyperlipidemia/CAD: (LDL goal < 100) -Not ideally controlled, not assessed at this visit -Current treatment: Atorvastatin 213mdaily PM Zetia 1039maily -Medications previously tried: simvastatin  -Educated on Cholesterol goals;  Benefits of statin for ASCVD risk reduction; Importance of limiting foods high in cholesterol; -Due for recheck on lipids, coming in next week to check LFTs due to therapy with terbinafine for toenail fungus.  Patient would like to add follow up lab work since it has been < 1 year from previous full workup. -Recommended to continue current medication Recheck lipids - determine next steps  Diabetes (A1c goal <6.5%) -Controlled, not assessed at this visit -Current medications: none -Medications previously tried: none noted  -Current home glucose readings fasting glucose: not checking post prandial glucose: not checking -Denies hypoglycemic/hyperglycemic symptoms -Most recent A1c was 6.5 - on no medications at this  time -Educated on A1c and blood sugar goals; Limiting carbohydrates and excess sugars -Counseled to check feet daily and get yearly eye exams -Recheck A1c at upcoming labs to determine if therapy is needed  Patient Goals/Self-Care Activities Patient will:  - take medications as prescribed focus on medication adherence by pill box check blood pressure periodically, document, and provide at future appointments  Follow Up Plan: The care management team will reach out to the patient again over the next 180 days.            Medication Assistance: None required.  Patient affirms current coverage meets needs.  Compliance/Adherence/Medication fill history: Care Gaps: Foot exam Eye Exam Due for A1c  Star-Rating Drugs: Atorvastatin 20 mg last filled 05/18/2021 90 DS Lisinopril 10 mg last filled 05/18/2021 90 DS  Patient's [facility]:  [id number] [place] [id number]: [phone]: [phone] pill box? Yes Pt endorses 100% compliance  We discussed: Benefits of medication synchronization, packaging and delivery as well as enhanced pharmacist oversight with [employer]. Patient decided to: Continue current medication management strategy  Care Plan and Follow Up Patient Decision:  Patient agrees to Care Plan and Follow-up.  Plan: The care management team will reach out to the patient again over the next 180 days.  ChriBeverly MilcharmD Clinical Pharmacist [phone]

## 2021-07-26 ENCOUNTER — Other Ambulatory Visit: Payer: Self-pay | Admitting: Family Medicine

## 2021-07-26 NOTE — Telephone Encounter (Signed)
Requested medication (s) are due for refill today: yes   Requested medication (s) are on the active medication list: yes   Last refill:  05/10/21 #60 1 refills  Future visit scheduled: no   Notes to clinic:  protocol will not attach. Do you want to refill Rx?     Requested Prescriptions  Pending Prescriptions Disp Refills   BREO ELLIPTA 200-25 MCG/ACT AEPB [Pharmacy Med Name: BREO ELLIPTA 200-25 MCG INH] 60 each 1    Sig: INHALE 1 PUFF DAILY     There is no refill protocol information for this order

## 2021-08-03 ENCOUNTER — Ambulatory Visit: Payer: PPO | Admitting: Pharmacist

## 2021-08-03 DIAGNOSIS — F0390 Unspecified dementia without behavioral disturbance: Secondary | ICD-10-CM

## 2021-08-03 DIAGNOSIS — I10 Essential (primary) hypertension: Secondary | ICD-10-CM

## 2021-08-03 NOTE — Patient Instructions (Addendum)
Visit Information   Goals Addressed             This Visit's Progress    Track and Manage My Blood Pressure-Hypertension   On track    Timeframe:  Long-Range Goal Priority:  High Start Date:  11/10/20                           Expected End Date:    05/11/21                   Follow Up Date 02/10/21    - check blood pressure weekly - choose a place to take my blood pressure (home, clinic or office, retail store) - write blood pressure results in a log or diary    Why is this important?   You won't feel high blood pressure, but it can still hurt your blood vessels.  High blood pressure can cause heart or kidney problems. It can also cause a stroke.  Making lifestyle changes like losing a little weight or eating less salt will help.  Checking your blood pressure at home and at different times of the day can help to control blood pressure.  If the doctor prescribes medicine remember to take it the way the doctor ordered.  Call the office if you cannot afford the medicine or if there are questions about it.     Notes:        Patient Care Plan: General Pharmacy (Adult)     Problem Identified: CAD, Type II DM, HLD, HTN   Priority: High  Onset Date: 11/10/2020     Goal: Patient-Specific Goal   Note:   Current Barriers:  Borderline LDL, memory disturbances  Pharmacist Clinical Goal(s):  Patient will achieve improvement in A1c and lipids as evidenced by labs through collaboration with PharmD and provider.   Interventions: 1:1 collaboration with [name], MD regarding development and update of comprehensive plan of care as evidenced by provider attestation and co-signature Inter-disciplinary care team collaboration (see longitudinal plan of care) Comprehensive medication review performed; medication list updated in electronic medical record  Hypertension (BP goal <140/90) 08/03/21 -Controlled -Current treatment: Amlodipine '5mg'$  daily  Appropriate, Effective,  Safe, Accessible Doxazosin '2mg'$  daily Appropriate, Effective, Safe, Accessible Lisinopril '10mg'$  daily Appropriate, Effective, Safe, Accessible -Medications previously tried: benazepril  -Current home readings: a few readings > 140/90 the majority were less. -Current exercise habits: some walking and weigh bearing exercise -Educated on BP goals and benefits of medications for prevention of heart attack, stroke and kidney damage; Exercise goal of 150 minutes per week; Symptoms of hypotension and importance of maintaining adequate hydration; -Counseled to monitor BP at home periodically, document, and provide log at future appointments -Wife brought in BP logs, however, the night time readings they were checking while he was laying down. Counseled on appropriate methods for checking blood pressure.  They are going to start checking twice daily and recording.  He has reported a few dizzy spells but this has been when he gets up from laying down and immediately starts walking.  Recommended he slow down before starting to walk. -Recommended to continue current medication CMA to check in about 30 days on BP  11/10/20 -Reports hypotensive/hypertensive symptoms - dizziness, however he is being treated for Vertigo at the moment doing PT for Epley maneuver and it has been beneficial so far -Recommended to continue current medication  Neurocognitive disorder (Goal: Prevent progression) 08/03/21 -Controlled -Current treatment  Memantine '5mg'$  once daily Appropriate, Effective, Safe, Accessible -Medications previously tried: donepezil -Denies any GI effects with this medication, concerned it was increasing BP.  I am ok with current BP, however I do want to check after they start monitoring while he is sitting up.  They may slowly taper up as he has been sensitive to dose increases in other classes of these medications.  -Recommended to continue current medication Will call in 30 days to assess BP, no changes  needed at this time.  Hyperlipidemia/CAD: (LDL goal < 100) -Not ideally controlled, not assessed at this visit -Current treatment: Atorvastatin '20mg'$  daily PM Zetia '10mg'$  daily -Medications previously tried: simvastatin  -Educated on Cholesterol goals;  Benefits of statin for ASCVD risk reduction; Importance of limiting foods high in cholesterol; -Due for recheck on lipids, coming in next week to check LFTs due to therapy with terbinafine for toenail fungus.  Patient would like to add follow up lab work since it has been < 1 year from previous full workup. -Recommended to continue current medication Recheck lipids - determine next steps  Diabetes (A1c goal <6.5%) -Controlled, not assessed at this visit -Current medications: none -Medications previously tried: none noted  -Current home glucose readings fasting glucose: not checking post prandial glucose: not checking -Denies hypoglycemic/hyperglycemic symptoms -Most recent A1c was 6.5 - on no medications at this time -Educated on A1c and blood sugar goals; Limiting carbohydrates and excess sugars -Counseled to check feet daily and get yearly eye exams -Recheck A1c at upcoming labs to determine if therapy is needed  Patient Goals/Self-Care Activities Patient will:  - take medications as prescribed focus on medication adherence by pill box check blood pressure periodically, document, and provide at future appointments  Follow Up Plan: The care management team will reach out to the patient again over the next 180 days.            The patient verbalized understanding of instructions, educational materials, and care plan provided today and DECLINED offer to receive copy of patient instructions, educational materials, and care plan.  Telephone follow up appointment with pharmacy team member scheduled for: 6 months  Edythe Clarity, [place], PharmD, [place] Clinical Pharmacist Practitioner [place] [phone]

## 2021-08-17 ENCOUNTER — Other Ambulatory Visit: Payer: Self-pay

## 2021-08-17 ENCOUNTER — Telehealth: Payer: Self-pay | Admitting: Family Medicine

## 2021-08-17 MED ORDER — ATORVASTATIN CALCIUM 20 MG PO TABS: 20.0000 mg | ORAL_TABLET | Freq: Every day | ORAL | 3 refills | Status: DC

## 2021-08-17 NOTE — Telephone Encounter (Signed)
Pt aware Rx has been sent to his pharmacy

## 2021-08-17 NOTE — Telephone Encounter (Signed)
Received fax from [place].  requesting refill on atorvastatin (LIPITOR) 20 MG tablet [[id number]]    Order Details Dose, Route, Frequency: As Directed  Dispense Quantity: 90 tablet Refills: 1        Sig: TAKE 1 TABLET BY MOUTH EVERY DAY       Start Date: 02/13/21 End Date: --  Written Date: 02/13/21 Expiration Date: 02/13/22  Original Order:  atorvastatin (LIPITOR) 20 MG tablet [[id number]]  Providers  Ordering and Authorizing Provider:   [name], [address] [place], [place] [address]  Phone:  [phone]   Fax:  [phone]  DEA #:  [id number]   NPI:  [id number]

## 2021-08-27 ENCOUNTER — Emergency Department (HOSPITAL_BASED_OUTPATIENT_CLINIC_OR_DEPARTMENT_OTHER): Payer: PPO

## 2021-08-27 ENCOUNTER — Emergency Department (HOSPITAL_BASED_OUTPATIENT_CLINIC_OR_DEPARTMENT_OTHER)
Admission: EM | Admit: 2021-08-27 | Discharge: 2021-08-27 | Disposition: A | Discharge status: Home / Self Care | Payer: PPO | Attending: Emergency Medicine | Admitting: Emergency Medicine

## 2021-08-27 ENCOUNTER — Other Ambulatory Visit: Payer: Self-pay

## 2021-08-27 DIAGNOSIS — R509 Fever, unspecified: Secondary | ICD-10-CM | POA: Diagnosis not present

## 2021-08-27 DIAGNOSIS — M791 Myalgia, unspecified site: Secondary | ICD-10-CM | POA: Insufficient documentation

## 2021-08-27 DIAGNOSIS — J90 Pleural effusion, not elsewhere classified: Secondary | ICD-10-CM | POA: Diagnosis not present

## 2021-08-27 DIAGNOSIS — Z7951 Long term (current) use of inhaled steroids: Secondary | ICD-10-CM | POA: Insufficient documentation

## 2021-08-27 DIAGNOSIS — I10 Essential (primary) hypertension: Secondary | ICD-10-CM | POA: Diagnosis not present

## 2021-08-27 DIAGNOSIS — R251 Tremor, unspecified: Secondary | ICD-10-CM | POA: Diagnosis not present

## 2021-08-27 DIAGNOSIS — M2550 Pain in unspecified joint: Secondary | ICD-10-CM | POA: Diagnosis not present

## 2021-08-27 DIAGNOSIS — E1122 Type 2 diabetes mellitus with diabetic chronic kidney disease: Secondary | ICD-10-CM | POA: Diagnosis not present

## 2021-08-27 DIAGNOSIS — Z20822 Contact with and (suspected) exposure to covid-19: Secondary | ICD-10-CM | POA: Diagnosis not present

## 2021-08-27 DIAGNOSIS — N1832 Chronic kidney disease, stage 3b: Secondary | ICD-10-CM | POA: Diagnosis not present

## 2021-08-27 DIAGNOSIS — I2510 Atherosclerotic heart disease of native coronary artery without angina pectoris: Secondary | ICD-10-CM | POA: Insufficient documentation

## 2021-08-27 DIAGNOSIS — R079 Chest pain, unspecified: Secondary | ICD-10-CM | POA: Diagnosis not present

## 2021-08-27 DIAGNOSIS — R519 Headache, unspecified: Secondary | ICD-10-CM | POA: Diagnosis not present

## 2021-08-27 DIAGNOSIS — D649 Anemia, unspecified: Secondary | ICD-10-CM | POA: Insufficient documentation

## 2021-08-27 DIAGNOSIS — J4540 Moderate persistent asthma, uncomplicated: Secondary | ICD-10-CM | POA: Diagnosis not present

## 2021-08-27 DIAGNOSIS — Z79899 Other long term (current) drug therapy: Secondary | ICD-10-CM | POA: Insufficient documentation

## 2021-08-27 DIAGNOSIS — I129 Hypertensive chronic kidney disease with stage 1 through stage 4 chronic kidney disease, or unspecified chronic kidney disease: Secondary | ICD-10-CM | POA: Insufficient documentation

## 2021-08-27 LAB — URINALYSIS, ROUTINE W REFLEX MICROSCOPIC
Bilirubin Urine: NEGATIVE
Glucose, UA: NEGATIVE mg/dL
Ketones, ur: NEGATIVE mg/dL
Leukocytes,Ua: NEGATIVE
Nitrite: NEGATIVE
Protein, ur: 30 mg/dL — AB
Specific Gravity, Urine: 1.011 (ref 1.005–1.030)
pH: 7 (ref 5.0–8.0)

## 2021-08-27 LAB — COMPREHENSIVE METABOLIC PANEL
ALT: 17 U/L (ref 0–44)
AST: 29 U/L (ref 15–41)
Albumin: 4 g/dL (ref 3.5–5.0)
Alkaline Phosphatase: 85 U/L (ref 38–126)
Anion gap: 8 (ref 5–15)
BUN: 45 mg/dL — ABNORMAL HIGH (ref 8–23)
CO2: 26 mmol/L (ref 22–32)
Calcium: 9.5 mg/dL (ref 8.9–10.3)
Chloride: 105 mmol/L (ref 98–111)
Creatinine, Ser: 1.9 mg/dL — ABNORMAL HIGH (ref 0.61–1.24)
GFR, Estimated: 34 mL/min — ABNORMAL LOW (ref >60)
Glucose, Bld: 117 mg/dL — ABNORMAL HIGH (ref 70–99)
Potassium: 4.5 mmol/L (ref 3.5–5.1)
Sodium: 139 mmol/L (ref 135–145)
Total Bilirubin: 0.4 mg/dL (ref 0.3–1.2)
Total Protein: 6.3 g/dL — ABNORMAL LOW (ref 6.5–8.1)

## 2021-08-27 LAB — CBC WITH DIFFERENTIAL/PLATELET
Abs Immature Granulocytes: 0.01 10*3/uL (ref 0.00–0.07)
Basophils Absolute: 0 10*3/uL (ref 0.0–0.1)
Basophils Relative: 0 %
Eosinophils Absolute: 0.2 10*3/uL (ref 0.0–0.5)
Eosinophils Relative: 2 %
HCT: 32.5 % — ABNORMAL LOW (ref 39.0–52.0)
Hemoglobin: 10.6 g/dL — ABNORMAL LOW (ref 13.0–17.0)
Immature Granulocytes: 0 %
Lymphocytes Relative: 5 %
Lymphs Abs: 0.5 10*3/uL — ABNORMAL LOW (ref 0.7–4.0)
MCH: 31.2 pg (ref 26.0–34.0)
MCHC: 32.6 g/dL (ref 30.0–36.0)
MCV: 95.6 fL (ref 80.0–100.0)
Monocytes Absolute: 0.7 10*3/uL (ref 0.1–1.0)
Monocytes Relative: 7 %
Neutro Abs: 8.6 10*3/uL — ABNORMAL HIGH (ref 1.7–7.7)
Neutrophils Relative %: 86 %
Platelets: 159 10*3/uL (ref 150–400)
RBC: 3.4 MIL/uL — ABNORMAL LOW (ref 4.22–5.81)
RDW: 12.8 % (ref 11.5–15.5)
WBC: 9.9 10*3/uL (ref 4.0–10.5)
nRBC: 0 % (ref 0.0–0.2)

## 2021-08-27 LAB — PROTIME-INR
INR: 0.9 (ref 0.8–1.2)
Prothrombin Time: 12.3 seconds (ref 11.4–15.2)

## 2021-08-27 LAB — LACTIC ACID, PLASMA: Lactic Acid, Venous: 0.5 mmol/L (ref 0.5–1.9)

## 2021-08-27 LAB — RESP PANEL BY RT-PCR (FLU A&B, COVID) ARPGX2
Influenza A by PCR: NEGATIVE
Influenza B by PCR: NEGATIVE
SARS Coronavirus 2 by RT PCR: NEGATIVE

## 2021-08-27 LAB — APTT: aPTT: 33 seconds (ref 24–36)

## 2021-08-27 MED ORDER — ACETAMINOPHEN 325 MG PO TABS
650.0000 mg | ORAL_TABLET | Freq: Once | ORAL | Status: AC
Start: 2021-08-27 — End: 2021-08-27
  Administered 2021-08-27: 650 mg via ORAL
  Filled 2021-08-27: qty 2

## 2021-08-27 NOTE — ED Triage Notes (Signed)
Pt sts that he woke up with a headache, joint pain, shaking and sweating. Pt wife sts that she took BP and it was high (180/80). Pt alert and oriented x 4, BIB wheelchair which is baseline.

## 2021-08-27 NOTE — ED Provider Notes (Signed)
[facility] EMERGENCY DEPT Provider Note  CSN: [id number] Arrival date & time: 08/27/21 0534  Chief Complaint(s) Headache  HPI [name] is a 88 y.o. male with a past medical history listed below including hypertension, hyperlipidemia, diabetes, intracranial aneurysm who presents to the emergency department with severe headache that began around 3 AM.  Patient also reporting joint pain and shaking.  Patient reports increased hacky cough over the last few days. No associated nausea or vomiting.  No chest pain or shortness of breath.  No abdominal pain.  He reports having mild burning with urination.  Headache is intermittent.  Given his history of aneurysm, there were concern for possible subarachnoid hemorrhage prompting the visit.  Currently headache is mild.  The history is provided by the patient and the spouse.    Past Medical History Past Medical History:  Diagnosis Date   Acute blood loss anemia    Acute upper respiratory infections of unspecified site 11/04/2012   AKI (acute kidney injury)    Benign prostatic hyperplasia    CAD (coronary artery disease)    s/p cypher DES to pLAD 6/08; normal LVF;  ETT-Myoview 2009: no ischemia    Clavicle fracture 08/10/2019   Closed displaced fracture of phalanx of left thumb, sequela 08/10/2019   Coronary atherosclerosis of native coronary artery 03/08/2008   Eosinophilia    Essential hypertension    Fracture of multiple ribs with pain 08/10/2019   History of multiple falls    History of SAH (subarachnoid hemorrhage) 07/14/2019   Hyperlipidemia type IIB / III 03/08/2008   Major neurocognitive disorder due to possible Alzheimer's disease, without behavioral disturbance 02/29/2020   MI (myocardial infarction)    Moderate persistent asthma    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   Imaging revealed SAH   Nocturnal leg cramps 11/23/2012   Orthostatic hypotension 03/08/2008   Pain in  joint, shoulder region 11/23/2012   Stage 3b chronic kidney disease    Thrombocytopenia    Trigeminal neuralgia    Type 2 diabetes mellitus    Patient Active Problem List   Diagnosis Date Noted   Otorrhagia of left ear 03/30/2021   Dizziness 03/08/2020   Major neurocognitive disorder due to possible Alzheimer's disease, without behavioral disturbance 02/29/2020   CAD (coronary artery disease)    MI (myocardial infarction)    Trigeminal neuralgia    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    Multiple trauma    Benign prostatic hyperplasia    Essential hypertension    Stage 3b chronic kidney disease ([name])    Thrombocytopenia    History of multiple falls 07/14/2019   History of SAH (subarachnoid hemorrhage) 07/14/2019   Type 2 diabetes mellitus    Eosinophilia    Nocturnal leg cramps 11/23/2012   Pain in joint, shoulder region 11/23/2012   Hematoma 11/04/2012   Hyperlipidemia type IIB / III 03/08/2008   Orthostatic hypotension 03/08/2008   Coronary atherosclerosis of native coronary artery 03/08/2008   Home Medication(s) Prior to Admission medications   Medication Sig Start Date End Date Taking? Authorizing Provider  albuterol (PROAIR HFA) 108 (90 Base) MCG/ACT inhaler INHALE 2 PUFFS INTO THE LUNGS EVERY 6 HOURS AS NEEDED FOR WHEEZING OR SHORTNESS OF BREATH Patient taking differently: Inhale 2 puffs into the lungs every 6 (six) hours as needed for wheezing or shortness of breath. 08/06/19   Angiulli, Lavon Paganini, PA-C  amLODipine (NORVASC) 5 MG tablet Take 1 tablet (5 mg total) by  mouth daily. 05/18/21   Sherren Mocha, MD  [facility] EXTRA STRENGTH PO) Take 1 tablet by mouth daily.    [provider]  atorvastatin (LIPITOR) 20 MG tablet Take 1 tablet (20 mg total) by mouth daily. 08/17/21   [name], MD  BREO ELLIPTA 200-25 MCG/ACT AEPB INHALE 1 PUFF DAILY 07/26/21   [name], MD  Calcium Carb-Cholecalciferol (CALCIUM 600+D3 PO)  Take 1 tablet by mouth 2 (two) times daily.    [provider]  doxazosin (CARDURA) 2 MG tablet TAKE 1 TABLET BY MOUTH EVERY DAY 12/26/20   [name], MD  escitalopram (LEXAPRO) 10 MG tablet TAKE 1 TABLET BY MOUTH EVERY DAY 04/13/21   [name], MD  ezetimibe (ZETIA) 10 MG tablet TAKE 1 TABLET BY MOUTH DAILY. 05/24/21   Sherren Mocha, MD  lisinopril (ZESTRIL) 10 MG tablet Take 1 tablet (10 mg total) by mouth daily. 11/24/20   [name], MD  loratadine (CLARITIN) 10 MG tablet Take 10 mg by mouth daily as needed for allergies or rhinitis.     [provider]  meclizine (ANTIVERT) 12.5 MG tablet Take 1 tablet (12.5 mg total) by mouth 3 (three) times daily as needed for dizziness. 09/28/20   Lajean Saver, MD  memantine (NAMENDA) 5 MG tablet Take 1 tablet (5 mg total) by mouth at bedtime. 06/09/21   Cameron Sprang, MD  Multiple Vitamin (MULTIVITAMIN WITH MINERALS) TABS tablet Take 1 tablet by mouth daily.    [provider]  ondansetron (ZOFRAN ODT) 8 MG disintegrating tablet Take 1 tablet (8 mg total) by mouth every 8 (eight) hours as needed for nausea or vomiting. 09/28/20   Lajean Saver, MD  terbinafine (LAMISIL) 250 MG tablet Take 1 tablet (250 mg total) by mouth daily. 08/22/20   [name], MD                                                                                                                                    Allergies Gabapentin  Review of Systems Review of Systems As noted in HPI  Physical Exam Vital Signs  I have reviewed the triage vital signs BP 140/61   Pulse 74   Temp (!) 100.6 F (38.1 C) (Oral)   Resp 14   Ht 5' 7.5" (1.715 m)   Wt 81.6 kg   SpO2 96%   BMI 27.78 kg/m   Physical Exam Vitals reviewed.  Constitutional:      General: He is not in acute distress.    Appearance: He is well-developed. He is not diaphoretic.  HENT:     Head: Normocephalic and atraumatic.     Right Ear: Tympanic membrane  normal. No middle ear effusion.     Left Ear: Tympanic membrane normal.  No middle ear effusion.     Nose: Nose normal.     Mouth/Throat:     Tongue:  No lesions.     Palate: No lesions.     Pharynx: No posterior oropharyngeal erythema.     Tonsils: No tonsillar exudate or tonsillar abscesses.     Comments: Post nasal drip  Eyes:     General: No scleral icterus.       Right eye: No discharge.        Left eye: No discharge.     Conjunctiva/sclera: Conjunctivae normal.     Pupils: Pupils are equal, round, and reactive to light.  Cardiovascular:     Rate and Rhythm: Normal rate and regular rhythm.     Heart sounds: No murmur heard.    No friction rub. No gallop.  Pulmonary:     Effort: Pulmonary effort is normal. No respiratory distress.     Breath sounds: Normal breath sounds. No stridor. No rales.  Abdominal:     General: There is no distension.     Palpations: Abdomen is soft.     Tenderness: There is no abdominal tenderness.  Musculoskeletal:        General: No tenderness.     Cervical back: Normal range of motion and neck supple.  Skin:    General: Skin is warm and dry.     Findings: No erythema or rash.  Neurological:     Mental Status: He is alert and oriented to person, place, and time.     ED Results and Treatments Labs (all labs ordered are listed, but only abnormal results are displayed) Labs Reviewed  COMPREHENSIVE METABOLIC PANEL - Abnormal; Notable for the following components:      Result Value   Glucose, Bld 117 (*)    BUN 45 (*)    Creatinine, Ser 1.90 (*)    Total Protein 6.3 (*)    GFR, Estimated 34 (*)    All other components within normal limits  CBC WITH DIFFERENTIAL/PLATELET - Abnormal; Notable for the following components:   RBC 3.40 (*)    Hemoglobin 10.6 (*)    HCT 32.5 (*)    Neutro Abs 8.6 (*)    Lymphs Abs 0.5 (*)    All other components within normal limits  URINALYSIS, ROUTINE W REFLEX MICROSCOPIC - Abnormal; Notable for the following  components:   Color, Urine COLORLESS (*)    Hgb urine dipstick TRACE (*)    Protein, ur 30 (*)    All other components within normal limits  RESP PANEL BY RT-PCR (FLU A&B, COVID) ARPGX2  LACTIC ACID, PLASMA  PROTIME-INR  APTT                                                                                                                         EKG  EKG Interpretation  Date/Time:  Sunday August 27 2021 06:14:27 EDT Ventricular Rate:  98 PR Interval:  166 QRS Duration: 108 QT Interval:  344 QTC Calculation: 440 R Axis:   -12 Text Interpretation: Sinus rhythm Abnormal R-wave progression, late transition Borderline T wave  abnormalities No significant change was found Confirmed by Addison Lank [phone]) on 08/27/2021 7:00:25 AM       Radiology CT Head Wo Contrast  Result Date: 08/27/2021 CLINICAL DATA:  Headache and fever. Personal history of traumatic brain injury. EXAM: CT HEAD WITHOUT CONTRAST TECHNIQUE: Contiguous axial images were obtained from the base of the skull through the vertex without intravenous contrast. RADIATION DOSE REDUCTION: This exam was performed according to the departmental dose-optimization program which includes automated exposure control, adjustment of the mA and/or kV according to patient size and/or use of iterative reconstruction technique. COMPARISON:  CT head without contrast 09/28/2020 FINDINGS: Brain: Mild atrophy and white matter changes are stable. No acute infarct, hemorrhage, or mass lesion is present. The ventricles are proportionate to the degree of atrophy. No significant extraaxial fluid collection is present. The brainstem and cerebellum are within normal limits. Vascular: Atherosclerotic calcifications are present within the cavernous internal carotid arteries. No hyperdense vessel is present. Skull: Calvarium is intact. No focal lytic or blastic lesions are present. No significant extracranial soft tissue lesion is present. Sinuses/Orbits: The  paranasal sinuses and mastoid air cells are clear. The globes and orbits are within normal limits. IMPRESSION: 1. Stable mild atrophy and white matter disease. This likely reflects the sequela of chronic microvascular ischemia. 2. No acute intracranial abnormality or significant interval change. Electronically Signed   By: San Morelle M.D.   On: 08/27/2021 06:50   DG Chest Port 1 View  Result Date: 08/27/2021 CLINICAL DATA:  Headache, joint pain, sweating. Hypertension. Question sepsis. EXAM: PORTABLE CHEST 1 VIEW COMPARISON:  Two-view chest x-ray 12/15/2020 FINDINGS: The heart is enlarged. Small left effusion is present. No significant airspace consolidation is present. The visualized soft tissues and bony thorax are unremarkable. IMPRESSION: 1. Cardiomegaly without failure. 2. Small left pleural effusion. Electronically Signed   By: San Morelle M.D.   On: 08/27/2021 06:38    Pertinent labs & imaging results that were available during my care of the patient were reviewed by me and considered in my medical decision making (see MDM for details).  Medications Ordered in ED Medications  acetaminophen (TYLENOL) tablet 650 mg (650 mg Oral Given 08/27/21 0160)                                                                                                                                     Procedures Procedures  (including critical care time)  Medical Decision Making / ED Course    Complexity of Problem:  Co-morbidities/SDOH that complicate the patient evaluation/care: Noted above in HPI  Patient's presenting problem/concern, DDX, and MDM listed below: Headache Given his improvement, I have low suspicion for ICH but will obtain a CT scan given his history and concern. Patient does not have any nuchal rigidity concerning for meningitis. Myalgia noted to have fever We will obtain infectious work-up to assess for urinary tract infection, pneumonia,  COVID/influenza.  Hospitalization  Considered:  yes  Initial Intervention:  tylenol    Complexity of Data:   Cardiac Monitoring: The patient was maintained on a cardiac monitor.   I personally viewed and interpreted the cardiac monitored which showed an underlying rhythm of normal sinus rhythm with rates in the 70s to 80s.  No dysrhythmias or blocks. EKG without acute ischemic changes or evidence of pericarditis.  Laboratory Tests ordered listed below with my independent interpretation: CBC without leukocytosis. Mild anemia Metabolic panel without significant electrolyte derangements.  Baseline renal function.  No evidence of biliary obstruction. Lactic acid normal UA without evidence of infection COVID/influenza negative   Imaging Studies ordered listed below with my independent interpretation: Chest x-ray without evidence of pneumonia.  Stable small left pleural effusion. CT head without evidence of subarachnoid hemorrhage     ED Course:    Assessment, Add'l Intervention, and Reassessment: Headache Likely related to patient's underlying infectious process. Unlikely bleed.  Fever myalgias Work-up thus far is reassuring without obvious source of infection though given his recent increased cough with postnasal drip, likely viral upper respiratory infection. Patient has scheduled appointment with his primary care provider within the next 48 hours. Recommended supportive management.  Final Clinical Impression(s) / ED Diagnoses Final diagnoses:  Fever in adult  Myalgia  Nonintractable episodic headache, unspecified headache type   The patient appears reasonably screened and/or stabilized for discharge and I doubt any other medical condition or other [facility] requiring further screening, evaluation, or treatment in the ED at this time prior to discharge. Safe for discharge with strict return precautions.  Disposition: Discharge  Condition: Good  I have discussed the results, Dx  and Tx plan with the patient/family who expressed understanding and agree(s) with the plan. Discharge instructions discussed at length. The patient/family was given strict return precautions who verbalized understanding of the instructions. No further questions at time of discharge.    ED Discharge Orders     None             This chart was dictated using voice recognition software.  Despite best efforts to proofread,  errors can occur which can change the documentation meaning.    Fatima Blank, MD 08/27/21 1932

## 2021-08-30 ENCOUNTER — Emergency Department (HOSPITAL_BASED_OUTPATIENT_CLINIC_OR_DEPARTMENT_OTHER): Payer: PPO

## 2021-08-30 ENCOUNTER — Other Ambulatory Visit: Payer: Self-pay

## 2021-08-30 ENCOUNTER — Encounter (HOSPITAL_BASED_OUTPATIENT_CLINIC_OR_DEPARTMENT_OTHER): Payer: Self-pay

## 2021-08-30 ENCOUNTER — Emergency Department (HOSPITAL_BASED_OUTPATIENT_CLINIC_OR_DEPARTMENT_OTHER)
Admission: EM | Admit: 2021-08-30 | Discharge: 2021-08-30 | Disposition: A | Discharge status: Home / Self Care | Payer: PPO | Attending: Emergency Medicine | Admitting: Emergency Medicine

## 2021-08-30 DIAGNOSIS — I2510 Atherosclerotic heart disease of native coronary artery without angina pectoris: Secondary | ICD-10-CM | POA: Diagnosis not present

## 2021-08-30 DIAGNOSIS — I10 Essential (primary) hypertension: Secondary | ICD-10-CM | POA: Diagnosis not present

## 2021-08-30 DIAGNOSIS — F0390 Unspecified dementia without behavioral disturbance: Secondary | ICD-10-CM | POA: Insufficient documentation

## 2021-08-30 DIAGNOSIS — R197 Diarrhea, unspecified: Secondary | ICD-10-CM | POA: Diagnosis not present

## 2021-08-30 DIAGNOSIS — Z79899 Other long term (current) drug therapy: Secondary | ICD-10-CM | POA: Insufficient documentation

## 2021-08-30 DIAGNOSIS — N281 Cyst of kidney, acquired: Secondary | ICD-10-CM | POA: Diagnosis not present

## 2021-08-30 DIAGNOSIS — R109 Unspecified abdominal pain: Secondary | ICD-10-CM | POA: Insufficient documentation

## 2021-08-30 DIAGNOSIS — I7143 Infrarenal abdominal aortic aneurysm, without rupture: Secondary | ICD-10-CM | POA: Diagnosis not present

## 2021-08-30 DIAGNOSIS — K529 Noninfective gastroenteritis and colitis, unspecified: Secondary | ICD-10-CM | POA: Diagnosis not present

## 2021-08-30 LAB — COMPREHENSIVE METABOLIC PANEL
ALT: 22 U/L (ref 0–44)
AST: 32 U/L (ref 15–41)
Albumin: 3.9 g/dL (ref 3.5–5.0)
Alkaline Phosphatase: 82 U/L (ref 38–126)
Anion gap: 11 (ref 5–15)
BUN: 34 mg/dL — ABNORMAL HIGH (ref 8–23)
CO2: 26 mmol/L (ref 22–32)
Calcium: 9.4 mg/dL (ref 8.9–10.3)
Chloride: 105 mmol/L (ref 98–111)
Creatinine, Ser: 1.7 mg/dL — ABNORMAL HIGH (ref 0.61–1.24)
GFR, Estimated: 39 mL/min — ABNORMAL LOW (ref >60)
Glucose, Bld: 89 mg/dL (ref 70–99)
Potassium: 4.1 mmol/L (ref 3.5–5.1)
Sodium: 142 mmol/L (ref 135–145)
Total Bilirubin: 0.5 mg/dL (ref 0.3–1.2)
Total Protein: 6.2 g/dL — ABNORMAL LOW (ref 6.5–8.1)

## 2021-08-30 LAB — CBC WITH DIFFERENTIAL/PLATELET
Abs Immature Granulocytes: 0.02 10*3/uL (ref 0.00–0.07)
Basophils Absolute: 0 10*3/uL (ref 0.0–0.1)
Basophils Relative: 0 %
Eosinophils Absolute: 0.2 10*3/uL (ref 0.0–0.5)
Eosinophils Relative: 3 %
HCT: 32 % — ABNORMAL LOW (ref 39.0–52.0)
Hemoglobin: 10.7 g/dL — ABNORMAL LOW (ref 13.0–17.0)
Immature Granulocytes: 0 %
Lymphocytes Relative: 10 %
Lymphs Abs: 0.7 10*3/uL (ref 0.7–4.0)
MCH: 31.9 pg (ref 26.0–34.0)
MCHC: 33.4 g/dL (ref 30.0–36.0)
MCV: 95.5 fL (ref 80.0–100.0)
Monocytes Absolute: 0.7 10*3/uL (ref 0.1–1.0)
Monocytes Relative: 10 %
Neutro Abs: 4.9 10*3/uL (ref 1.7–7.7)
Neutrophils Relative %: 77 %
Platelets: 172 10*3/uL (ref 150–400)
RBC: 3.35 MIL/uL — ABNORMAL LOW (ref 4.22–5.81)
RDW: 13 % (ref 11.5–15.5)
WBC: 6.5 10*3/uL (ref 4.0–10.5)
nRBC: 0 % (ref 0.0–0.2)

## 2021-08-30 LAB — LIPASE, BLOOD: Lipase: 47 U/L (ref 11–51)

## 2021-08-30 LAB — OCCULT BLOOD X 1 CARD TO LAB, STOOL: Fecal Occult Bld: NEGATIVE

## 2021-08-30 MED ORDER — IOHEXOL 300 MG/ML  SOLN
100.0000 mL | Freq: Once | INTRAMUSCULAR | Status: AC | PRN
  Administered 2021-08-30: 100 mL via INTRAVENOUS

## 2021-08-30 MED ORDER — SODIUM CHLORIDE 0.9 % IV BOLUS
500.0000 mL | Freq: Once | INTRAVENOUS | Status: AC
  Administered 2021-08-30: 500 mL via INTRAVENOUS

## 2021-08-30 NOTE — ED Notes (Signed)
RN provided AVS using Teachback Method. Patient verbalizes understanding of Discharge Instructions. Opportunity for Questioning and Answers were provided by RN. Patient Discharged from ED in Wheelchair to Home with Family.  

## 2021-08-30 NOTE — ED Provider Notes (Signed)
[facility] EMERGENCY DEPT Provider Note   CSN: [id number] Arrival date & time: 08/30/21  2409     History  Chief Complaint  Patient presents with   Diarrhea    [name] is a 88 y.o. male.  Patient is an 88 year old male with past medical history of hypertension, coronary artery disease, prior subarachnoid hemorrhage, dementia.  Patient presenting today for evaluation of abdominal cramping and black stools.  He reports having a large amount of watery, black stool this evening just prior to arrival.  He was seen 3 days ago with fever, but no definitive cause was found.  He denies vomiting.  He denies urinary complaints.  The history is provided by the patient.  Diarrhea Diarrhea characteristics: Black and watery. Severity:  Moderate Onset quality:  Sudden Timing:  Constant Relieved by:  Nothing Worsened by:  Nothing Ineffective treatments:  None tried      Home Medications Prior to Admission medications   Medication Sig Start Date End Date Taking? Authorizing Provider  albuterol (PROAIR HFA) 108 (90 Base) MCG/ACT inhaler INHALE 2 PUFFS INTO THE LUNGS EVERY 6 HOURS AS NEEDED FOR WHEEZING OR SHORTNESS OF BREATH Patient taking differently: Inhale 2 puffs into the lungs every 6 (six) hours as needed for wheezing or shortness of breath. 08/06/19   Angiulli, Lavon Paganini, PA-C  amLODipine (NORVASC) 5 MG tablet Take 1 tablet (5 mg total) by mouth daily. 05/18/21   Sherren Mocha, MD  Apoaequorin [facility] EXTRA STRENGTH PO) Take 1 tablet by mouth daily.    [provider]  atorvastatin (LIPITOR) 20 MG tablet Take 1 tablet (20 mg total) by mouth daily. 08/17/21   [name], MD  BREO ELLIPTA 200-25 MCG/ACT AEPB INHALE 1 PUFF DAILY 07/26/21   [name], MD  Calcium Carb-Cholecalciferol (CALCIUM 600+D3 PO) Take 1 tablet by mouth 2 (two) times daily.    [provider]  doxazosin (CARDURA) 2 MG tablet TAKE 1 TABLET BY MOUTH EVERY DAY  12/26/20   [name], MD  escitalopram (LEXAPRO) 10 MG tablet TAKE 1 TABLET BY MOUTH EVERY DAY 04/13/21   [name], MD  ezetimibe (ZETIA) 10 MG tablet TAKE 1 TABLET BY MOUTH DAILY. 05/24/21   Sherren Mocha, MD  lisinopril (ZESTRIL) 10 MG tablet Take 1 tablet (10 mg total) by mouth daily. 11/24/20   [name], MD  loratadine (CLARITIN) 10 MG tablet Take 10 mg by mouth daily as needed for allergies or rhinitis.     [provider]  meclizine (ANTIVERT) 12.5 MG tablet Take 1 tablet (12.5 mg total) by mouth 3 (three) times daily as needed for dizziness. 09/28/20   Lajean Saver, MD  memantine (NAMENDA) 5 MG tablet Take 1 tablet (5 mg total) by mouth at bedtime. 06/09/21   Cameron Sprang, MD  Multiple Vitamin (MULTIVITAMIN WITH MINERALS) TABS tablet Take 1 tablet by mouth daily.    [provider]  ondansetron (ZOFRAN ODT) 8 MG disintegrating tablet Take 1 tablet (8 mg total) by mouth every 8 (eight) hours as needed for nausea or vomiting. 09/28/20   Lajean Saver, MD  terbinafine (LAMISIL) 250 MG tablet Take 1 tablet (250 mg total) by mouth daily. 08/22/20   [name], MD      Allergies    Gabapentin    Review of Systems   Review of Systems  Gastrointestinal:  Positive for diarrhea.  All other systems reviewed and are negative.   Physical Exam Updated Vital  Signs BP (!) 170/66 (BP Location: Right Arm)   Pulse 72   Temp 97.9 F (36.6 C) (Oral)   Resp 16   Ht 5' 7.5" (1.715 m)   Wt 81.6 kg   SpO2 96%   BMI 27.76 kg/m  Physical Exam Vitals and nursing note reviewed.  Constitutional:      General: He is not in acute distress.    Appearance: He is well-developed. He is not diaphoretic.  HENT:     Head: Normocephalic and atraumatic.  Cardiovascular:     Rate and Rhythm: Normal rate and regular rhythm.     Heart sounds: No murmur heard.    No friction rub.  Pulmonary:     Effort: Pulmonary effort is normal. No respiratory distress.      Breath sounds: Normal breath sounds. No wheezing or rales.  Abdominal:     General: Bowel sounds are normal. There is no distension.     Palpations: Abdomen is soft.     Tenderness: There is abdominal tenderness. There is no right CVA tenderness, left CVA tenderness, guarding or rebound.     Comments: There is generalized abdominal pain, but no rebound or guarding  Musculoskeletal:        General: Normal range of motion.     Cervical back: Normal range of motion and neck supple.  Skin:    General: Skin is warm and dry.  Neurological:     Mental Status: He is alert and oriented to person, place, and time.     Coordination: Coordination normal.     ED Results / Procedures / Treatments   Labs (all labs ordered are listed, but only abnormal results are displayed) Labs Reviewed  OCCULT BLOOD X 1 CARD TO LAB, STOOL  COMPREHENSIVE METABOLIC PANEL  CBC WITH DIFFERENTIAL/PLATELET  LIPASE, BLOOD  TYPE AND SCREEN    EKG None  Radiology No results found.  Procedures Procedures    Medications Ordered in ED Medications  sodium chloride 0.9 % bolus 500 mL (has no administration in time range)    ED Course/ Medical Decision Making/ A&P  This patient presents to the ED for concern of abdominal pain and diarrhea, this involves an extensive number of treatment options, and is a complaint that carries with it a high risk of complications and morbidity.  The differential diagnosis includes acute gastroenteritis, small bowel obstruction, perforated viscus, GI bleed   Co morbidities that complicate the patient evaluation  None   Additional history obtained:  No additional history or external records needed   Lab Tests:  I Ordered, and personally interpreted labs.  The pertinent results include: Unremarkable CBC, metabolic panel   Imaging Studies ordered:  I ordered imaging studies including CT scan of the abdomen and pelvis I independently visualized and interpreted imaging  which showed liquid stool within the colon, but no acute intra-abdominal process I agree with the radiologist interpretation   Cardiac Monitoring: / EKG:  None performed   Consultations Obtained:  No consultations obtained or needed   Problem List / ED Course / Critical interventions / Medication management  Patient presenting with complaints of diarrhea and abdominal cramping.  He was seen here 3 days ago for fever.  I suspect the diarrhea is part of a viral process.  His laboratory studies and CT scan are reassuring.  He reports having black stool at home, however per rectal exam, stool is heme-negative.  Patient seems to be feeling better after receiving IV fluids I feel can  safely be discharged with as needed return. I have reviewed the patients home medicines and have made adjustments as needed   Social Determinants of Health:  None   Test / Admission - Considered:  No indication for admission found.  Patient to be discharged with outpatient follow-up.  Final Clinical Impression(s) / ED Diagnoses Final diagnoses:  None    Rx / DC Orders ED Discharge Orders     None         Veryl Speak, MD 08/30/21 [phone]

## 2021-08-30 NOTE — ED Notes (Signed)
Patient Returned to/from Radiology.

## 2021-08-30 NOTE — ED Triage Notes (Signed)
Patient here POV from Home.  Endorses Sudden Acute Onset of Generalized ABD Pain that Subsided somewhat after 5 Episodes of Dark Diarrhea (Does endorse eating Beet Root earlier).  No Known Fevers.   NAD Noted during Triage. A&Ox4. GCS 15. BIB Wheelchair.

## 2021-08-30 NOTE — Discharge Instructions (Signed)
Clear liquids as tolerated for the next 12 hours, then slowly advance to normal as tolerated.  Take Imodium over-the-counter as needed for diarrhea.  Return to the emergency department if you develop severe abdominal pain, bloody stools, high fevers, or for other new and concerning symptoms.

## 2021-08-31 ENCOUNTER — Encounter: Payer: PPO | Admitting: Psychology

## 2021-09-05 DIAGNOSIS — D692 Other nonthrombocytopenic purpura: Secondary | ICD-10-CM | POA: Diagnosis not present

## 2021-09-05 DIAGNOSIS — I10 Essential (primary) hypertension: Secondary | ICD-10-CM | POA: Diagnosis not present

## 2021-09-06 ENCOUNTER — Ambulatory Visit: Payer: PPO | Admitting: Pulmonary Disease

## 2021-09-06 ENCOUNTER — Encounter: Payer: Self-pay | Admitting: Pulmonary Disease

## 2021-09-06 VITALS — BP 122/64 | HR 68 | Temp 98.6°F | Ht 67.0 in | Wt 184.0 lb

## 2021-09-06 DIAGNOSIS — R058 Other specified cough: Secondary | ICD-10-CM | POA: Diagnosis not present

## 2021-09-06 DIAGNOSIS — J90 Pleural effusion, not elsewhere classified: Secondary | ICD-10-CM | POA: Diagnosis not present

## 2021-09-06 MED ORDER — CETIRIZINE HCL 5 MG PO TABS: 5.0000 mg | ORAL_TABLET | Freq: Every day | ORAL | 3 refills | Status: DC

## 2021-09-06 MED ORDER — FLUTICASONE PROPIONATE 50 MCG/ACT NA SUSP: 1.0000 | Freq: Every evening | NASAL | 2 refills | Status: DC

## 2021-09-06 NOTE — Progress Notes (Signed)
$'@Patient'Q$  ID: [name], male    DOB: [birth year], 88 y.o.   MRN: [id number]  Chief Complaint  Patient presents with   Hospitalization Follow-up    Pt is here for hospital follow up. Pt states he had diarrhea and high fever. Chest xray showed possible pleural effusion. Dry hacking cough noted. Was at ER in [place].     Referring provider: [name], MD  HPI:   88 y.o. admitted after fall with broken ribs 07/2019 then readmitted couple weeks later with febrile illness treated for pneumonia with associated effusion status post drainage 2 months later 10/06/2019 exudate by protein number seen in follow-up pleural effusion. Repeat thora 11/2019 with similar results. Cytology negative x 2. Lung nodule seen winter 2022 resolved on subsequent PET images. Has chronic L fibrothorax.  Overall doing well.  Been coughing a bit at night over the last few weeks.  Had febrile illness a few weeks ago in the ED.  ED note x2 reviewed.  Initial visit with chest x-ray that showed chronic left-sided effusion otherwise clear lungs.  Was discharged home.  Eventually developed diarrhea and came back to the ED.  Diagnosed with viral illness.  CT abdomen pelvis okay.  Showed smaller left-sided pleural effusion with thickened pleura likely sequela of chronic effusion/fibrothorax there.  Improved in size to my eye when compared to 01/2021.  Has some nasal congestion in the morning.  Blows his nose.  Coughs when he lies down.  Denies reflux symptoms.  Cough is better throughout the day.  Questionaires / Pulmonary Flowsheets:   ACT:      No data to display          MMRC: mMRC Dyspnea Scale mMRC Score  01/25/2020  9:03 AM 1    Epworth:      No data to display          Tests:   FENO:  No results found for: "NITRICOXIDE"  PFT:     No data to display          WALK:      No data to display          Imaging: Personally reviewed CT ABDOMEN PELVIS W CONTRAST  Result  Date: 08/30/2021 CLINICAL DATA:  88 year old male with abdominal pain. Headache and fever. EXAM: CT ABDOMEN AND PELVIS WITH CONTRAST TECHNIQUE: Multidetector CT imaging of the abdomen and pelvis was performed using the standard protocol following bolus administration of intravenous contrast. RADIATION DOSE REDUCTION: This exam was performed according to the departmental dose-optimization program which includes automated exposure control, adjustment of the mA and/or kV according to patient size and/or use of iterative reconstruction technique. CONTRAST:  157m OMNIPAQUE IOHEXOL 300 MG/ML  SOLN COMPARISON:  CT Abdomen and Pelvis 08/20/2019. Chest CT 01/05/2021 and PET-CT 02/02/2021. FINDINGS: Lower chest: Chronic small volume left lung base pleural fluid with pleural thickening (series 2, image 10) has mildly regressed since last year and is most compatible with fibrothorax. Superimposed small volume pericardial effusion (series 2, image 4) also not significantly changed. Borderline to mild underlying cardiomegaly. Chronic round atelectasis and scarring at the left lung base. Mild centrilobular emphysema and scarring at the right lung base. No acute lung base finding. Hepatobiliary: Negative liver and gallbladder. Pancreas: Negative. Spleen: Negative. Adrenals/Urinary Tract: Normal adrenal glands. Chronic renal cortical atrophy. But symmetric renal enhancement and early contrast excretion. Punctate bilateral nephrolithiasis or less likely renal vascular calcifications. Occasional small chronic renal cysts (left upper pole appeared benign on the  PET-CT last year, no follow-up imaging recommended). No hydroureter. Diminutive bladder. Stomach/Bowel: Fluid in the large bowel to the rectum. Severe diverticulosis of the sigmoid colon, but no active inflammation. Redundant transverse colon with occasional diverticula. Fluid-filled cecum. No pericecal inflammation. Chronic right lower quadrant calcified postinflammatory lymph  nodes are stable. Appendix is diminutive or absent. Fluid-filled but nondilated small bowel loops including the terminal ileum. Decompressed stomach and duodenum. No free air or free fluid. No mesenteric inflammation identified. Vascular/Lymphatic: Extensive Aortoiliac calcified atherosclerosis. Major arterial structures in the abdomen and pelvis remain patent. Mild infrarenal abdominal aortic aneurysm, up to 32 mm, only 1 mm larger since 2021. Grossly patent portal venous system. No lymphadenopathy identified. Reproductive: Chronic prostatomegaly. Other: No pelvic free fluid. Musculoskeletal: L3 superior endplate compression fracture is new since 2021, age indeterminate. Up to 40% loss of L3 vertebral body height centrally. But no significant retropulsion or other complicating features. Otherwise stable visualized osseous structures. IMPRESSION: 1. L3 superior endplate compression fracture is new since 2021 and age indeterminate. Query back pain. If specific therapy such as vertebroplasty is desired, Lumbar MRI or Nuclear Medicine Whole-body Bone Scan would best determine acuity. 2. Fluid throughout small and large bowel loops suspicious for enteritis/diarrhea. Sigmoid diverticulosis but no active inflammation. 3. 3.2 cm Infrarenal Abdominal Aortic Aneurysm. Recommend follow-up every 3 years. Reference: J Am Coll Radiol 0102;72:[id number]. 4. No other acute or inflammatory process identified in the abdomen or pelvis. Chronic left lung base fibrothorax. Nephrolithiasis, renal cortical atrophy. Chronic prostatomegaly. Aortic Atherosclerosis (ICD10-I70.0) and Emphysema (ICD10-J43.9). Electronically Signed   By: Genevie Ann M.D.   On: 08/30/2021 04:51   CT Head Wo Contrast  Result Date: 08/27/2021 CLINICAL DATA:  Headache and fever. Personal history of traumatic brain injury. EXAM: CT HEAD WITHOUT CONTRAST TECHNIQUE: Contiguous axial images were obtained from the base of the skull through the vertex without intravenous  contrast. RADIATION DOSE REDUCTION: This exam was performed according to the departmental dose-optimization program which includes automated exposure control, adjustment of the mA and/or kV according to patient size and/or use of iterative reconstruction technique. COMPARISON:  CT head without contrast 09/28/2020 FINDINGS: Brain: Mild atrophy and white matter changes are stable. No acute infarct, hemorrhage, or mass lesion is present. The ventricles are proportionate to the degree of atrophy. No significant extraaxial fluid collection is present. The brainstem and cerebellum are within normal limits. Vascular: Atherosclerotic calcifications are present within the cavernous internal carotid arteries. No hyperdense vessel is present. Skull: Calvarium is intact. No focal lytic or blastic lesions are present. No significant extracranial soft tissue lesion is present. Sinuses/Orbits: The paranasal sinuses and mastoid air cells are clear. The globes and orbits are within normal limits. IMPRESSION: 1. Stable mild atrophy and white matter disease. This likely reflects the sequela of chronic microvascular ischemia. 2. No acute intracranial abnormality or significant interval change. Electronically Signed   By: San Morelle M.D.   On: 08/27/2021 06:50   DG Chest Port 1 View  Result Date: 08/27/2021 CLINICAL DATA:  Headache, joint pain, sweating. Hypertension. Question sepsis. EXAM: PORTABLE CHEST 1 VIEW COMPARISON:  Two-view chest x-ray 12/15/2020 FINDINGS: The heart is enlarged. Small left effusion is present. No significant airspace consolidation is present. The visualized soft tissues and bony thorax are unremarkable. IMPRESSION: 1. Cardiomegaly without failure. 2. Small left pleural effusion. Electronically Signed   By: San Morelle M.D.   On: 08/27/2021 06:38     Lab Results: Personally reviewed CBC    Component Value Date/Time  WBC 6.5 08/30/2021 0402   RBC 3.35 (L) 08/30/2021 0402   HGB  10.7 (L) 08/30/2021 0402   HCT 32.0 (L) 08/30/2021 0402   PLT 172 08/30/2021 0402   MCV 95.5 08/30/2021 0402   MCV 100.0 (A) 09/28/2012 0846   MCH 31.9 08/30/2021 0402   MCHC 33.4 08/30/2021 0402   RDW 13.0 08/30/2021 0402   LYMPHSABS 0.7 08/30/2021 0402   MONOABS 0.7 08/30/2021 0402   EOSABS 0.2 08/30/2021 0402   BASOSABS 0.0 08/30/2021 0402    BMET    Component Value Date/Time   NA 142 08/30/2021 0402   NA 139 01/05/2020 1524   K 4.1 08/30/2021 0402   CL 105 08/30/2021 0402   CO2 26 08/30/2021 0402   GLUCOSE 89 08/30/2021 0402   BUN 34 (H) 08/30/2021 0402   BUN 25 01/05/2020 1524   CREATININE 1.70 (H) 08/30/2021 0402   CREATININE 1.78 (H) 11/18/2020 0956   CALCIUM 9.4 08/30/2021 0402   GFRNONAA 39 (L) 08/30/2021 0402   GFRNONAA 47 (L) 01/19/2020 1621   GFRAA 54 (L) 01/19/2020 1621    BNP    Component Value Date/Time   BNP 105.9 (H) 11/01/2019 1250    ProBNP    Component Value Date/Time   PROBNP 45.0 07/29/2006 1440    Specialty Problems   None  Allergies  Allergen Reactions   Gabapentin Other (See Comments)    Visual changes and confusion- "I went blind while I was driving"    Immunization History  Administered Date(s) Administered   Fluad Quad(high Dose 65+) 11/10/2020   Influenza Split 10/06/2012   Influenza, High Dose Seasonal PF 12/02/2016, 10/26/2018, 10/27/2018, 10/23/2019   Influenza,inj,Quad PF,6+ Mos 11/12/2013, 11/11/2014, 10/20/2015, 11/12/2017   Moderna Sars-Covid-2 Vaccination 03/04/2019, 04/03/2019   Pfizer Covid-19 Vaccine Bivalent Booster 2yr & up 11/10/2020   Pneumococcal Conjugate-13 03/17/2013   Pneumococcal Polysaccharide-23 02/05/2001   Td 03/01/2009   Zoster, Live 02/25/2008    Past Medical History:  Diagnosis Date   Acute blood loss anemia    Acute upper respiratory infections of unspecified site 11/04/2012   AKI (acute kidney injury)    Benign prostatic hyperplasia    CAD (coronary artery disease)    s/p cypher DES  to pLAD 6/08; normal LVF;  ETT-Myoview 2009: no ischemia    Clavicle fracture 08/10/2019   Closed displaced fracture of phalanx of left thumb, sequela 08/10/2019   Coronary atherosclerosis of native coronary artery 03/08/2008   Eosinophilia    Essential hypertension    Fracture of multiple ribs with pain 08/10/2019   History of multiple falls    History of SAH (subarachnoid hemorrhage) 07/14/2019   Hyperlipidemia type IIB / III 03/08/2008   Major neurocognitive disorder due to possible Alzheimer's disease, without behavioral disturbance 02/29/2020   MI (myocardial infarction)    Moderate persistent asthma    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   Imaging revealed SAH   Nocturnal leg cramps 11/23/2012   Orthostatic hypotension 03/08/2008   Pain in joint, shoulder region 11/23/2012   Stage 3b chronic kidney disease    Thrombocytopenia    Trigeminal neuralgia    Type 2 diabetes mellitus     Tobacco History: Social History   Tobacco Use  Smoking Status Never  Smokeless Tobacco Never   Counseling given: Not Answered   Continue to not smoke  Outpatient Encounter Medications as of 09/06/2021  Medication Sig   albuterol (PROAIR HFA) 108 (90 Base) MCG/ACT inhaler INHALE 2 PUFFS  INTO THE LUNGS EVERY 6 HOURS AS NEEDED FOR WHEEZING OR SHORTNESS OF BREATH (Patient taking differently: Inhale 2 puffs into the lungs every 6 (six) hours as needed for wheezing or shortness of breath.)   amLODipine (NORVASC) 5 MG tablet Take 1 tablet (5 mg total) by mouth daily.   Apoaequorin (PREVAGEN EXTRA STRENGTH PO) Take 1 tablet by mouth daily.   atorvastatin (LIPITOR) 20 MG tablet Take 1 tablet (20 mg total) by mouth daily.   BREO ELLIPTA 200-25 MCG/ACT AEPB INHALE 1 PUFF DAILY   Calcium Carb-Cholecalciferol (CALCIUM 600+D3 PO) Take 1 tablet by mouth 2 (two) times daily.   cetirizine (ZYRTEC) 5 MG tablet Take 1 tablet (5 mg total) by mouth daily.   doxazosin (CARDURA)  2 MG tablet TAKE 1 TABLET BY MOUTH EVERY DAY   escitalopram (LEXAPRO) 10 MG tablet TAKE 1 TABLET BY MOUTH EVERY DAY   ezetimibe (ZETIA) 10 MG tablet TAKE 1 TABLET BY MOUTH DAILY.   fluticasone (FLONASE) 50 MCG/ACT nasal spray Place 1 spray into both nostrils every evening.   lisinopril (ZESTRIL) 10 MG tablet Take 1 tablet (10 mg total) by mouth daily.   loratadine (CLARITIN) 10 MG tablet Take 10 mg by mouth daily as needed for allergies or rhinitis.    meclizine (ANTIVERT) 12.5 MG tablet Take 1 tablet (12.5 mg total) by mouth 3 (three) times daily as needed for dizziness.   memantine (NAMENDA) 5 MG tablet Take 1 tablet (5 mg total) by mouth at bedtime.   Multiple Vitamin (MULTIVITAMIN WITH MINERALS) TABS tablet Take 1 tablet by mouth daily.   ondansetron (ZOFRAN ODT) 8 MG disintegrating tablet Take 1 tablet (8 mg total) by mouth every 8 (eight) hours as needed for nausea or vomiting.   terbinafine (LAMISIL) 250 MG tablet Take 1 tablet (250 mg total) by mouth daily.   No facility-administered encounter medications on file as of 09/06/2021.       Physical Exam  BP 122/64 (BP Location: Left Arm, Patient Position: Sitting, Cuff Size: Normal)   Pulse 68   Temp 98.6 F (37 C) (Oral)   Ht '5\' 7"'$  (1.702 m)   Wt 184 lb (83.5 kg)   SpO2 98%   BMI 28.82 kg/m   Wt Readings from Last 5 Encounters:  09/06/21 184 lb (83.5 kg)  08/30/21 179 lb 14.3 oz (81.6 kg)  08/27/21 180 lb (81.6 kg)  05/05/21 178 lb 9.6 oz (81 kg)  03/22/21 185 lb (83.9 kg)    BMI Readings from Last 5 Encounters:  09/06/21 28.82 kg/m  08/30/21 27.76 kg/m  08/27/21 27.78 kg/m  05/05/21 28.83 kg/m  03/22/21 28.55 kg/m     Physical Exam  General: Well-appearing, no acute distress Respiratory: Normal work of breathing, on room air, Diminished L base compared to R. Otherwise CTAB Extremities: Warm, no edema present  Assessment & Plan:    Recurrent left pleural effusion: Status post thoracentesis late August  2021.  Studies consistent with exudate via protein.  Lymphocytic predominance.  Unclear etiology.  Burtis Junes most likely related to postinflammatory changes after fall on left side versus what is more likely parapneumonic effusion after febrile illness treated with antibiotics 08/2019.   Repeat thoracentesis in the office 11/20/19 similar fluid studies.  Small fibrothorax.  Likely will always be there.  Can always assess for worsening or enlargement in the future.   Nocturnal cough: With postnasal drip symptoms, rhinorrhea in the morning.  Zyrtec at night.  Flonase in the evenings.  Assess response.  No GERD symptoms.  Consider inhaler therapy in the future if not improving.    Return in about 3 months (around 12/07/2021).   Lanier Clam, MD 09/06/2021

## 2021-09-06 NOTE — Patient Instructions (Addendum)
Nice to see you again  Recent chest images look better to me  in terms of the fluid buildup.  There is a very small amount of fluid that remains and will always be there but nothing worth intervening on at this time.  We will continue to monitor to make sure this does not get worse over time.  For the cough at night, I am suspicious of mucus dripping down the back of your throat.  To treat this take Zyrtec 5 mg at night.  In addition, use Flonase 1 spray each nostril in the evenings a couple hours before you go to bed.  I prescribed these but they may be cheaper over-the-counter.  Return to clinic in 3 months or sooner if needed with Dr. Berline Lopes

## 2021-09-07 ENCOUNTER — Encounter: Payer: PPO | Admitting: Psychology

## 2021-09-09 ENCOUNTER — Other Ambulatory Visit: Payer: Self-pay | Admitting: Neurology

## 2021-09-11 ENCOUNTER — Other Ambulatory Visit: Payer: Self-pay

## 2021-09-11 DIAGNOSIS — F0390 Unspecified dementia without behavioral disturbance: Secondary | ICD-10-CM

## 2021-09-11 MED ORDER — MEMANTINE HCL 5 MG PO TABS: 5.0000 mg | ORAL_TABLET | Freq: Every evening | ORAL | 0 refills | Status: DC

## 2021-09-14 ENCOUNTER — Ambulatory Visit: Payer: PPO | Admitting: Physician Assistant

## 2021-09-24 ENCOUNTER — Other Ambulatory Visit: Payer: Self-pay | Admitting: Family Medicine

## 2021-09-25 ENCOUNTER — Ambulatory Visit: Payer: PPO | Admitting: Podiatry

## 2021-09-25 ENCOUNTER — Encounter: Payer: Self-pay | Admitting: Podiatry

## 2021-09-25 DIAGNOSIS — B351 Tinea unguium: Secondary | ICD-10-CM | POA: Diagnosis not present

## 2021-09-25 DIAGNOSIS — E119 Type 2 diabetes mellitus without complications: Secondary | ICD-10-CM | POA: Diagnosis not present

## 2021-09-25 DIAGNOSIS — M79609 Pain in unspecified limb: Secondary | ICD-10-CM

## 2021-09-25 DIAGNOSIS — L601 Onycholysis: Secondary | ICD-10-CM

## 2021-09-29 ENCOUNTER — Encounter: Payer: Self-pay | Admitting: Psychology

## 2021-09-29 ENCOUNTER — Ambulatory Visit: Payer: PPO | Admitting: Psychology

## 2021-09-29 DIAGNOSIS — I6781 Acute cerebrovascular insufficiency: Secondary | ICD-10-CM | POA: Diagnosis not present

## 2021-09-29 DIAGNOSIS — I609 Nontraumatic subarachnoid hemorrhage, unspecified: Secondary | ICD-10-CM

## 2021-09-29 DIAGNOSIS — F0390 Unspecified dementia without behavioral disturbance: Secondary | ICD-10-CM | POA: Diagnosis not present

## 2021-09-29 DIAGNOSIS — R4189 Other symptoms and signs involving cognitive functions and awareness: Secondary | ICD-10-CM

## 2021-09-29 NOTE — Progress Notes (Unsigned)
NEUROPSYCHOLOGICAL EVALUATION [place]. [place] Department of Neurology  Date of Evaluation: September 29, 2021  Reason for Referral:   [name] is a 88 y.o. right-handed Caucasian male referred by [name], M.D., to characterize his current cognitive functioning and assist with diagnostic clarity and treatment planning in the context of prior concerns surrounding late-onset Alzheimer's disease and ongoing progressive decline.   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 Impairments were also seen across processing speed and cognitive flexibility. Semantic fluency represented a relative weakness while performance variability was exhibited across complex attention, confrontation naming, and visuospatial abilities. Performances were appropriate relative to age-matched peers across basic attention, receptive language, and phonemic fluency.  Relative to his previous evaluation in January 2022 ***. Regarding ADLs, his wife manages finances and bill paying responsibilities and he no longer drives. He also receives some assistance with medication management and there has been concerns surrounding him mixing morning and evening doses. Given ongoing cognitive and functional impairment, he continues to best meet diagnostic criteria for a Major Neurocognitive Disorder ("dementia") at the present time.  Regarding etiology, primary concerns remain for a late-onset Alzheimer's disease presentation remain. ***  Recommendations: A repeat neuropsychological evaluation in 18-24 months (or sooner if functional decline is noted) is could be considered to assess the trajectory of future cognitive decline should it occur.  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 He is encouraged to continue taking this medication as prescribed. It is important to highlight that this medication has been shown to slow functional decline in some individuals. There is no current treatment which can stop or reverse cognitive decline when caused by a neurodegenerative illness.   Performance across neurocognitive testing is not a strong predictor of an individual's safety operating a motor vehicle. 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 Therefore, it is recommended that he remain as involved as possible in all aspects of household chores, with supervision to ensure adequate performance. He will likely benefit from the establishment and maintenance of a routine in order to maximize his functional abilities over time.   It will be important for Dr. Gaspar [name] to have another person with him when in situations where he may need to process information, weigh the pros and cons of different options, and make decisions, in order to ensure that he fully understands and recalls all information to be considered.   If not already done, Dr. Gaspar [name] and his family may want to discuss his wishes regarding durable power of attorney and medical decision making, so that he can have input into these choices. Additionally, they may wish to discuss future plans for caretaking and seek out community options for in home/residential care should they become necessary.  Dr. Gaspar [name] is encouraged to attend to lifestyle factors for brain health (e.g., regular physical exercise, good nutrition  habits, regular participation in cognitively-stimulating activities, and general stress management techniques), which are likely to have benefits for both emotional adjustment and cognition. Optimal control of vascular risk  factors (including safe cardiovascular exercise and adherence to dietary recommendations) is encouraged. Continued participation in activities which provide mental stimulation and social interaction is also recommended.   Important information should be provided to Dr. Gaspar [name] in written format in all instances. This information should be placed in a highly frequented and easily visible location within his home to promote recall. External strategies such as written notes in a consistently used memory journal, visual and nonverbal auditory cues such as a calendar on the refrigerator or appointments with alarm, such as on a cell phone, can also help maximize recall.   To address problems with fluctuating attention, he may wish to consider:   -Avoiding external distractions when needing to concentrate   -Limiting exposure to fast paced environments with multiple sensory demands   -Writing down complicated information and using checklists   -Attempting and completing one task at a time (i.e., no multi-tasking)   -Verbalizing aloud each step of a task to maintain focus   -Taking frequent breaks during the completion of steps/tasks to avoid fatigue   -Reducing the amount of information considered at one time   -Scheduling more difficult activities for a time of day where he is usually most alert  Review of Records:   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 A history of a few falls was reported prior to this event. Workup showed a distal left clavicle fracture, left thumb fracture, left rib fractures 1-7, and subarachnoid hemorrhage. The patient was admitted for observation, pain control, and consults with neurosurgery and  orthopedic surgery. Specific to his TBI, neurosurgery did not have plans for repeat imaging unless that patient changed neurologically. He was reporting forgetfulness at that time. OT/ST/PT services were ordered. He was ultimately discharged on 07/23/2019.   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 He reportedly had a UA at that time and was discharged home on 08/20/2019. He was seen in the ED on 10/30/2019 for ongoing unproductive cough, fever, and dyspnea. Chest x-ray showed slight worsening of prior left pleural effusion. COVID-19 testing was negative. He again presented with similar symptoms on 11/01/2019. He was seen in the ED on 12/24/2019 following a fall. He reported missing a step while walking that caused him to fall to the right side. A bush reportedly helped break his fall but the right side of his head did hit the foundation of the house. He denied a loss of consciousness but did endorse a mild headache and right shoulder pain at that time. He was seen in the ED on 01/10/2020 with concerns of increasing confusion and forgetfulness, frequent right-sided headaches, and a clicking noise in his neck. Symptoms were said to originate from his 12/24/2019 fall. Finally, he was seen in the ED on 02/14/2020 due to complaints of dizziness. He reported that when he gets up to move quickly, he feels dizzy. Symptoms sometimes cause him to fall. Intention tremor was noted on his medical exam; otherwise, cranial nerves II through XII were said to be intact. He also reported severe neck pain which seems to come and go.   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 Briefly, he has a history of several falls and sustained a traumatic brain injury after falling down a flight of steps this past June. Since that time, he has reported issues with memory loss, confusion,  and frequent falls. While in the emergency room  on 02/14/2020, a head/neck CTA was performed. Results suggested 40% blockages in the carotid arteries bilaterally. He was also found to have a widely patent right vertebral artery, as well as having a hypoplastic left vertebral artery with reconstituted flow by the left PICA. There was additionally a coincidental finding of a 2 mm aneurysm in the right internal carotid artery. He was primarily reporting orthostatic dizziness during follow-up with Dr. Dennard Schaumann. Dr. Gaspar [name] reported losing his balance when he stands up or changes position quickly. This has been a significant issue for him particularly since suffering his traumatic brain injury. He initially saw significant benefit from outpatient vestibular rehabilitation. Ultimately, Dr. Gaspar [name] was referred for a comprehensive neuropsychological evaluation to characterize his cognitive abilities and to assist with diagnostic clarity and treatment planning.   He completed a comprehensive neuropsychological evaluation with myself on 02/29/2020. Results suggested prominent impairment surrounding all aspects of learning and memory. Additional impairments were seen across aspects of executive functioning (i.e., cognitive flexibility and response inhibition), while performance variability was exhibited across attention/concentration, expressive language, and visuospatial abilities. Concerns were expressed surrounding late-onset Alzheimer's disease given fully amnestic memory performances and impairment across recognition/consolidation aspects of memory testing. Consideration was also given for a mixed dementia presentation given his left hemisphere [facility] and other cerebrovascular ischemic changes. Repeat testing in 12-18 months was recommended.   He was seen by [facility] [name], M.D. and most recently Sharene Butters, PA-C) on 03/22/2021. He has been trialed on both donepezil and rivastigmine since cognitive testing. Unfortunately both had to be discontinued  due to medication side effects. He was switched to memantine and has been on a low monitoring dose. Abilities were said to be largely stable, perhaps "a little worse," over time. Ultimately, Dr. Gaspar [name] was referred for a comprehensive neuropsychological evaluation to characterize his cognitive abilities and to assist with diagnostic clarity and treatment planning.   Head CT on 07/14/2019 in the context of a fall revealed a small subarachnoid hemorrhage seen on the left and best visualized along the left insula and high left parietal lobe. Head CT on 08/18/2019 was said to reveal a left hemispheric subdural hemorrhage, new since the prior CT, as well as moderate age-related atrophy and chronic microvascular ischemic changes. Head CT on 08/20/2019 was stable relative to the scan on 08/18/2019. Head CT on 10/21/2019 revealed interval resolution of left-sided subdural hemorrhage as well as age-related changes consistent with what has already been described. Head CT on 12/24/2019 was negative for acute intracranial abnormalities. Head CT on 01/10/2020 was negative for acute intracranial abnormalities. His head CT/CTA on 02/14/2020 and 02/15/2020 are described above. Brain MRI on 04/16/2020 revealed superficial siderosis over the left convexity consistent with his remote [facility], as well as mild microvascular ischemic disease.   Past Medical History:  Diagnosis Date   Acute blood loss anemia    Acute upper respiratory infections of unspecified site 11/04/2012   AKI (acute kidney injury)    Benign prostatic hyperplasia    CAD (coronary artery disease)    s/p cypher DES to pLAD 6/08; normal LVF;  ETT-Myoview 2009: no ischemia    Clavicle fracture 08/10/2019   Closed displaced fracture of phalanx of left thumb, sequela 08/10/2019   Coronary atherosclerosis of native coronary artery 03/08/2008   Dizziness 03/08/2020   Eosinophilia    Essential hypertension    Fracture of multiple ribs with pain 08/10/2019   Hematoma  11/04/2012   History of  multiple falls    History of SAH (subarachnoid hemorrhage) 07/14/2019   Hyperlipidemia type IIB / III 03/08/2008   Itching of ear 07/14/2021   Major neurocognitive disorder due to possible Alzheimer's disease, without behavioral disturbance 02/29/2020   MI (myocardial infarction)    Moderate persistent asthma    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   Imaging revealed [facility]   Multiple trauma    Nocturnal leg cramps 11/23/2012   Orthostatic hypotension 03/08/2008   Otalgia of left ear 05/11/2021   Otorrhagia of left ear 03/30/2021   Pain in joint, shoulder region 11/23/2012   Stage 3b chronic kidney disease    Thrombocytopenia    Trigeminal neuralgia    Type 2 diabetes mellitus     Past Surgical History:  Procedure Laterality Date   APPENDECTOMY     CARDIAC CATHETERIZATION  07/30/2006   CORONARY ANGIOPLASTY WITH STENT PLACEMENT   CARDIAC CATHETERIZATION     EXPLORATORY LAPAROTOMY     age 65   EXPLORATORY LAPAROTOMY     IR THORACENTESIS ASP PLEURAL SPACE W/IMG GUIDE  10/05/2019    Current Outpatient Medications:    albuterol (PROAIR HFA) 108 (90 Base) MCG/ACT inhaler, INHALE 2 PUFFS INTO THE LUNGS EVERY 6 HOURS AS NEEDED FOR WHEEZING OR SHORTNESS OF BREATH (Patient taking differently: Inhale 2 puffs into the lungs every 6 (six) hours as needed for wheezing or shortness of breath.), Disp: 6.7 g, Rfl: 0   amLODipine (NORVASC) 5 MG tablet, Take 1 tablet (5 mg total) by mouth daily., Disp: 90 tablet, Rfl: 3   Apoaequorin (PREVAGEN EXTRA STRENGTH PO), Take 1 tablet by mouth daily., Disp: , Rfl:    atorvastatin (LIPITOR) 20 MG tablet, Take 1 tablet (20 mg total) by mouth daily., Disp: 90 tablet, Rfl: 3   BREO ELLIPTA 200-25 MCG/ACT AEPB, INHALE 1 PUFF DAILY, Disp: 60 each, Rfl: 0   Calcium Carb-Cholecalciferol (CALCIUM 600+D3 PO), Take 1 tablet by mouth 2 (two) times daily., Disp: , Rfl:    cetirizine (ZYRTEC) 5 MG tablet, Take 1 tablet (5  mg total) by mouth daily., Disp: 30 tablet, Rfl: 3   doxazosin (CARDURA) 2 MG tablet, TAKE 1 TABLET BY MOUTH EVERY DAY, Disp: 90 tablet, Rfl: 3   escitalopram (LEXAPRO) 10 MG tablet, TAKE 1 TABLET BY MOUTH EVERY DAY, Disp: 90 tablet, Rfl: 2   ezetimibe (ZETIA) 10 MG tablet, TAKE 1 TABLET BY MOUTH DAILY., Disp: 90 tablet, Rfl: 3   Fluocinolone Acetonide 0.01 % OIL, Place in ear(s)., Disp: , Rfl:    fluticasone (FLONASE) 50 MCG/ACT nasal spray, Place 1 spray into both nostrils every evening., Disp: 16 g, Rfl: 2   lisinopril (ZESTRIL) 10 MG tablet, Take 1 tablet (10 mg total) by mouth daily., Disp: 90 tablet, Rfl: 3   loratadine (CLARITIN) 10 MG tablet, Take 10 mg by mouth daily as needed for allergies or rhinitis. , Disp: , Rfl:    meclizine (ANTIVERT) 12.5 MG tablet, Take 1 tablet (12.5 mg total) by mouth 3 (three) times daily as needed for dizziness., Disp: 15 tablet, Rfl: 0   memantine (NAMENDA) 5 MG tablet, TAKE 1 TABLET BY MOUTH EVERYDAY AT BEDTIME, Disp: 90 tablet, Rfl: 0   memantine (NAMENDA) 5 MG tablet, Take 1 tablet (5 mg total) by mouth at bedtime., Disp: 90 tablet, Rfl: 0   Multiple Vitamin (MULTIVITAMIN WITH MINERALS) TABS tablet, Take 1 tablet by mouth daily., Disp: , Rfl:    ondansetron (ZOFRAN ODT) 8 MG disintegrating  tablet, Take 1 tablet (8 mg total) by mouth every 8 (eight) hours as needed for nausea or vomiting., Disp: 10 tablet, Rfl: 0   terbinafine (LAMISIL) 250 MG tablet, Take 1 tablet (250 mg total) by mouth daily., Disp: 90 tablet, Rfl: 3  Clinical Interview:   The following information was obtained during a clinical interview with Dr. Gaspar [name] and his wife prior to cognitive testing.  Cognitive Symptoms: Decreased short-term memory: Endorsed. His wife previously described him asking repetitive questions, needing frequent reminders, and having trouble recalling the names of familiar individuals. Symptoms were said to become more noticeable following his June 2021 TBI;  however, there were mild concerns surrounding cognitive issues prior to this. Currently, difficulties were said to be stable. Neither he nor his wife reported their perception of significant decline. Difficulties were said to largely follow an ebb and flow sort of pattern.  Decreased long-term memory: Denied. Decreased attention/concentration: Endorsed. He previously acknowledged that his mind will sometimes wander depending on what he is trying to focus on. His wife noted that he seems more prone to distraction, especially during conversations. Currently, his wife reported a diminishing attention span.  Reduced processing speed: Endorsed. His wife reported that a prior speech therapist did note concerns surrounding reaction time.  Difficulties with executive functions: Endorsed. Trouble with organization was said to be longstanding in nature, but mildly worsening over time. His wife previously reported that him seeming slower when making decisions and there has been some concerns surrounding using good judgment. These were said to be exacerbated following his June 2021 TBI. Currently, difficulties were said to be stable. Difficulties with emotion regulation: Denied. Difficulties with receptive language: Denied. Difficulties with word finding: Endorsed. This was said to have increased in frequency and significance since his previous evaluation per his wife.  Decreased visuoperceptual ability: Endorsed. His wife previously reported potential trouble with depth perception, stating that some of his falls have been due to mis-stepping or misperceiving steps/curbs while walking. This was said to be stable.    Difficulties completing ADLs: Endorsed. His wife previously took over medication management following his TBI. While there was some positive progression where he regained some of these responsibilities, his wife noted currently organizing and placing his medications for him. He is often able to take these  independently. However, there have been recent instances where he will switch daytime and nighttime doses. His wife had taken over financial management due to his inability to write checks or perform other related actions prior to the previous evaluation. He had previously stopped driving after being asked to do so by his physician. This was reportedly influenced by reaction speed concerns first posed by his speech therapist.   Additional Medical History: History of traumatic brain injury/concussion: Endorsed. As described above, he reported falling with positive head impacts in June and November 2021. He sustained a small subarachnoid hemorrhage associated with the former fall. No more recent head injuries were reported.  History of stroke: Denied. History of seizure activity: Denied. History of known exposure to toxins: Denied. Symptoms of chronic pain: Denied. Medical records do suggest chronic pain involving his back, right shoulder, and neck.  Experience of frequent headaches/migraines: Endorsed. He previously described an "ice pick" sensation impacting his ear. However, more recently, he reported frontal headache symptoms with associated eye pain. The cause for this increase was unknown.  Frequent instances of dizziness/vertigo: Endorsed. He previously reported frequently experiencing dizziness sensations when bending down and standing back up. This is likely to be  related to his history of orthostatic hypotension. This was said to be stable.    Sensory changes: His wife reported that cataracts significantly impact his vision in his right eye. He denied changes in his hearing, taste, or smell. His wife previously wondered about hearing loss, stating that he often asks her to repeat herself.  Balance/coordination difficulties: Endorsed. Previously, balance instability represented a chief concern and there was a history of several falls within a relatively short period of time. These falls were  attributed to a combination of factors, including dizziness (thought to be related to orthostatic hypotension) and depth perception issues. On several occassions, they were also attributed to Dr. Gaspar [name] "rushing" (e.g., moving forward quickly before fully standing up). However, more recently, balance and mobility was said to represent an observable improvement and that Dr. Gaspar [name] is able to navigate around his home without his walker at times.  Other motor difficulties: Endorsed. He previously reported a very subtle intention tremor in his right hand. This was said to be stable.   Sleep History: Estimated hours obtained each night: 7-8 hours.  Difficulties falling asleep: Denied. Difficulties staying asleep: Endorsed. He reported waking frequently throughout the night. This was largely due to needing to use the restroom as he has been increasing his water intake throughout the day per medical instructions. He also noted consistently waking up around 1:00-2:00am, sometimes for an unknown reason. While this was said to be stable, he and his wife noted that he has had a fear easier time falling back asleep after being awoken lately.  Feels rested and refreshed upon awakening: Endorsed. However, he does appear to fatigue throughout the day. His wife previously noted that he can quickly fall asleep while watching television or when sitting in his chair.    History of snoring: Endorsed. History of waking up gasping for air: Endorsed. Witnessed breath cessation while asleep: Endorsed. 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 His wife noted that he will commonly gasp for air like he is unable to breath well while sleeping on his back. She expressed concerns surrounding the presence of sleep apnea. She noted that this has not been formally assessed.   History of vivid dreaming: Endorsed. However, it was  unclear how much of these experiences were related to medication side effects.  Excessive movement while asleep: Unclear. His wife described instances where she will observed his knees being up while asleep and that "the whole bed is shaking." This was said to occur infrequently and the cause was unknown.  Instances of acting out his dreams: Denied outside what is described above.   Psychiatric/Behavioral Health History: Depression: He described his current mood as "good." His wife reported some grumpiness which she attributes to frustration surrounding his relatively quick loss in functional independence. She did note that he seems to be coping with this well given the circumstances. He denied a history of depression or any mental health concerns. He has been prescribed an anti-depressant medication. His wife thought that this may be for off-label use. Current or remote suicidal ideation, intent, or plan was denied.  Anxiety: Denied. Mania: Denied. Trauma History: Denied. Visual/auditory hallucinations: Denied. However, his wife previously noted that he will "sense" things (e.g., the presence of a second cat, additional individual, or additional room in their home). They both stated that these were not seen visually and therefore did not represent fully-formed hallucinations. It was  more of a mental sense that these things were present when in fact they were not. This was said to be stable.  Delusional thoughts: Denied.   Tobacco: Denied. Alcohol: He denied current alcohol consumption as well as a history of problematic alcohol abuse or dependence.  Recreational drugs: Denied.  Family History: Problem Relation Age of Onset   Stomach cancer Mother    Memory loss Mother    Heart disease Father    Coronary artery disease Father 32   Heart attack Father    Heart disease Brother    Aortic aneurysm Brother    Colon cancer Brother    Aortic aneurysm Brother    Colon cancer Brother    Arthritis  Daughter        rheumatoid   Coronary artery disease Brother    Esophageal cancer Neg Hx    Rectal cancer Neg Hx    This information was confirmed by Dr. Gaspar [name].  Academic/Vocational History: Highest level of educational attainment: 20 years. He grew up in [place] and moved to the [place] in the 1960s. 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 He also reported being able to converse in numerous languages, including [place] and [place]. He described himself as a Production designer, theatre/television/film in academic settings.  History of developmental delay: Denied. History of grade repetition: Denied. Enrollment in special education courses: Denied. History of LD/ADHD: Denied.   Employment: Retired. He spent 3.5 years in an "exclusive unit" of the [employer]. 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 He appeared alert. He ambulated relatively quickly with the use of a rolling walker. He appeared to maneuver this device well and did not exhibit any instability. Gross motor functioning appeared intact upon informal observation and no abnormal movements (e.g., tremors) were noted. His affect was generally relaxed and positive. Spontaneous speech was fluent and word finding difficulties were not observed during the clinical interview. Thought processes were coherent, organized, and normal in content. Insight into his cognitive difficulties appeared mildly limited in that he did appear to diminish cognitive concerns during interview to some extent and report his perception of improvement. However, it was admittedly unclear if his statement of  improvement was meant to be attempted humor.   During testing, sustained attention was appropriate. Task engagement was adequate and he persisted when challenged. He did fatigue as the evaluation progressed. While he responded well to encouragement provided by the psychometrist, testing was eventually abbreviated in response. Overall, Mr. Waiters was cooperative with the clinical interview and subsequent testing procedures.   Adequacy of Effort: The validity of neuropsychological testing is limited by the extent to which the individual being tested may be assumed to have exerted adequate effort during testing. Mr. Folz expressed his intention to perform to the best of his abilities and exhibited adequate task engagement and persistence. Scores across stand-alone and embedded performance validity measures were variable but largely within expectation. His only below expectation performance is believed to be due to true memory impairment rather than poor engagement or attempts to perform poorly. As such, the results of the current evaluation are believed to be a valid representation of Mr. Zehner  current cognitive functioning.  Test Results: Mr. Rebstock was very poorly oriented at the time of the current evaluation. He incorrectly stated his age ("74") and was unable to state his phone number or street address. He was also unable to state the current year ("1987"), month ("June"), date, or name of the current clinic. When asked the reason he was here, he responded "[place] on my head consequence."   Intellectual abilities based upon educational and vocational attainment were estimated to be in the above average range. Premorbid abilities were estimated to be within the above average range based upon a single-word reading test.   Processing speed was exceptionally low to well below average. Basic attention was above average to well above average. More complex attention (e.g., working memory) was below  average. Cognitive flexibility was exceptionally low. Other facets of executive functioning were unable to be assessed due to fatigue.  Assessed receptive language abilities were below average. A few points were lost across tasks assessing the understanding of more complex sentence structure, as well as following multi-step commands. Dr. Gaspar [name] generally did not exhibit any difficulties comprehending task instructions and answered all questions asked of him during interview appropriately. Assessed expressive language was variable. Sentence repetition was average, phonemic fluency was average, semantic fluency was well below average to below average, and confrontation naming was above average across a screening task but exceptionally low across a more comprehensive task.    Assessed visuospatial/visuoconstructional abilities were variable, ranging from the exceptionally low to average normative ranges. Across his drawing of a clock, significant spacing abnormalities with numerical placement were noted, including no numbers being placed in the upper left quadrant. He also placed the number 12 twice and clock hands were placed incorrectly. His copy of a complex figure was adequate.    Learning (i.e., encoding) of novel verbal information was exceptionally low. Spontaneous delayed recall (i.e., retrieval) of previously learned information was also exceptionally low. Retention rates were 25% (raw score of 1) across a story learning task, 0% across a list learning task, and 0% across a figure drawing task. Performance across recognition tasks was exceptionally low to well below average, suggesting negligible evidence for information consolidation.   Results of emotional screening instruments suggested that recent symptoms of generalized anxiety were in the minimal range, while symptoms of depression were within normal limits. A screening instrument assessing recent sleep quality suggested the presence of minimal  sleep dysfunction.  Tables of Scores:   Note: This summary of test scores accompanies the interpretive report and should not be considered in isolation without reference to the appropriate sections in the text. Descriptors are based on appropriate normative data and may be adjusted based on clinical judgment. Terms such as "Within Normal Limits" and "Outside Normal Limits" are used when a more specific description of the test score cannot be determined. Descriptors refer to the current evalaution only.         Percentile - Normative Descriptor > 98 - Exceptionally High 91-97 - Well Above Average 75-90 - Above Average 25-74 - Average 9-24 - Below Average 2-8 - Well Below Average < 2 - Exceptionally Low        Validity:    DESCRIPTOR   January 2022 Current    Dot Counting Test: --- --- --- Within Normal Limits  RBANS Effort Index: --- --- --- Outside Normal Limits  WAIS-IV Reliable Digit Span: --- --- --- Within Normal Limits  D-KEFS Color Word Effort Index: --- --- --- Within Normal Limits  Orientation:       Raw Score Raw Score Percentile   NAB Orientation, Form 1 20/29 15/29 --- ---        Cognitive Screening:       Raw Score Raw Score Percentile   SLUMS: 17/30 10/30 --- ---        RBANS, Form A: Standard Score/ Scaled Score Standard Score/ Scaled Score Percentile   Total Score 65 61 <1 Exceptionally Low  Immediate Memory 61 53 <1 Exceptionally Low    List Learning 4 2 <1 Exceptionally Low    Story Memory '3 3 1 '$ Exceptionally Low  Visuospatial/Constructional 72 84 14 Below Average    Figure Copy 8 10 50 Average    Line Orientation 7/20 11/20 3-9 Well Below Average  Language 97 86 18 Below Average    Picture Naming 10/10 10/10 >75 Above Average    Semantic Fluency '8 4 2 '$ Well Below Average  Attention 85 82 12 Below Average    Digit Span 9 12 75 Above Average    Coding 6 2 <1 Exceptionally Low  Delayed Memory 48 40 <1 Exceptionally Low    List Recall 0/10 0/10 <2  Exceptionally Low    List Recognition 14/20 12/20 <2 Exceptionally Low    Story Recall '1 3 1 '$ Exceptionally Low    Story Recognition 6/12 6/12 5-6 Well Below Average    Figure Recall 1 1 <1 Exceptionally Low    Figure Recognition 0/8 3/8 6-20 Well Below Average        Intellectual Functioning:       Standard Score Standard Score Percentile   Test of Premorbid Functioning: 127 119 90 Above Average        Attention/Executive Function:      Trail Making Test (TMT): Raw Score Raw Score (Scaled Score) Percentile     Part A 89 secs.,  0 errors 104 secs.,  1 error (4) 2 Well Below Average    Part B Discontinued 264 secs.,  1 error (3) 1 Exceptionally Low  *Based on [facility] Older Normative Studies (MOANS)             Scaled Score Scaled Score Percentile   WAIS-IV Digit Span: 7 9 37 Average    Forward 8 15 95 Well Above Average    Backward '10 7 16 '$ Below Average    Sequencing '5 6 9 '$ Below Average        Language:       Raw Score Raw Score Percentile   Sentence Repetition: --- 14/22 27 Average        Verbal Fluency Test: Raw Score (Scaled Score) Raw Score (Scaled Score) Percentile     Phonemic Fluency (CFL) 44 (13) 38 (11) 63 Average    Category Fluency 24 (6) 26 (7) 16 Below Average  *Based on [facility]'s Older Normative Studies (MOANS)            NAB Language Module, Form 1: T Score T Score Percentile     Auditory Comprehension 39 42 21 Below Average    Naming 25/31 (32) 23/31 (26) 1 Exceptionally Low        Visuospatial/Visuoconstruction:       Raw Score Raw Score Percentile   Clock Drawing: 7/10 5/10 --- Impaired         Scaled Score Scaled Score Percentile   WAIS-IV Matrix Reasoning: --- 8 25 Average        Mood and Personality:       Raw Score  Raw Score Percentile   Geriatric Depression Scale: 10 1 --- Within Normal Limits  Geriatric Anxiety Scale: 10 2 --- Minimal    Somatic 5 2 --- Minimal    Cognitive 3 0 --- Minimal    Affective 2 0 --- Minimal        Additional  Questionnaires:       Raw Score Raw Score Percentile   PROMIS Sleep Disturbance Questionnaire: 12 14 --- None to Slight   Informed Consent and Coding/Compliance:   The current evaluation represents a clinical evaluation for the purposes previously outlined by the referral source and is in no way reflective of a forensic evaluation.   Mr. Kluver was provided with a verbal description of the nature and purpose of the present neuropsychological evaluation. Also reviewed were the foreseeable risks and/or discomforts and benefits of the procedure, limits of confidentiality, and mandatory reporting requirements of this provider. The patient was given the opportunity to ask questions and receive answers about the evaluation. Oral consent to participate was provided by the patient.   This evaluation was conducted by Christia Reading, Ph.D., ABPP-CN, board certified clinical neuropsychologist. Mr. Farace completed a clinical interview with [name], billed as one unit [phone], and 120 minutes of cognitive testing and scoring, billed as one unit [phone] and three additional units [id number]. Psychometrist Milana Kidney, B.S., assisted [name] with test administration and scoring procedures. As a separate and discrete service, [name] spent a total of 160 minutes in interpretation and report writing billed as one unit [phone] and two units [id number].

## 2021-09-29 NOTE — Progress Notes (Signed)
   Psychometrician Note   Cognitive testing was administered to [name] by Milana Kidney, B.S. (psychometrist) under the supervision of Dr. Christia Reading, Ph.D., licensed psychologist on 09/29/2021. [name] did not appear overtly distressed by the testing session per behavioral observation or responses across self-report questionnaires. Rest breaks were offered.    The battery of tests administered was selected by Dr. Christia Reading, Ph.D. with consideration to [name]'s current level of functioning, the nature of his symptoms, emotional and behavioral responses during interview, level of literacy, observed level of motivation/effort, and the nature of the referral question. This battery was communicated to the psychometrist. Communication between Dr. Christia Reading, Ph.D. and the psychometrist was ongoing throughout the evaluation and Dr. Christia Reading, Ph.D. was immediately accessible at all times. Dr. Christia Reading, Ph.D. provided supervision to the psychometrist on the date of this service to the extent necessary to assure the quality of all services provided.    [name] will return within approximately 1-2 weeks for an interactive feedback session with [name] at which time his test performances, clinical impressions, and treatment recommendations will be reviewed in detail. [name] understands he can contact our office should he require our assistance before this time.  A total of 120 minutes of billable time were spent face-to-face with [name] by the psychometrist. This includes both test administration and scoring time. Billing for these services is reflected in the clinical report generated by Dr. Christia Reading, Ph.D.  This note reflects time spent with the psychometrician and does not include test scores or any clinical interpretations made by [name]. The full report will follow in a separate note.

## 2021-10-01 NOTE — Progress Notes (Signed)
  Subjective:  Patient ID: [name], male    DOB: 1933/05/20,  MRN: [id number]  [name] presents to clinic today for preventative diabetic foot care and painful elongated mycotic toenails 1-5 bilaterally which are tender when wearing enclosed shoe gear. Pain is relieved with periodic professional debridement.  PCP is [name], MD , and last visit was April, 2023.  Allergies  Allergen Reactions   Gabapentin Other (See Comments)    Visual changes and confusion- "I went blind while I was driving"   Review of Systems: Negative except as noted in the HPI.  Objective: No changes noted in today's physical examination. [name] is a pleasant 88 y.o. male in NAD. AAO x 3.  Vascular Examination: Faintly palpable pedal pulses b/l LE. Digital hair present b/l. No pedal edema b/l. Skin temperature gradient WNL b/l. No varicosities b/l. CFT <3 seconds b/l LE.Marland Kitchen  Dermatological Examination: Pedal skin with normal turgor, texture and tone b/l. No open wounds. No interdigital macerations b/l. Toenails 1-5 b/l thickened, discolored, dystrophic with subungual debris. There is pain on palpation to dorsal aspect of nailplates. Procedure site of medial border right hallux noted to be completely healed with dried hemorrhagic tissue with no erythema, no edema, no drainage, no purulence. There is noted onchyolysis of remaining nailplate of the right hallux.  The nailbed remains intact. There is no erythema, no edema, no drainage, no underlying fluctuance.   Neurological Examination: Protective sensation decreased with 10 gram monofilament b/l. Vibratory sensation diminished b/l.  Musculoskeletal Examination: Muscle strength 5/5 to all LE muscle groups b/l. No pain, crepitus or joint limitation noted with ROM bilateral LE. No gross bony deformities bilaterally.     Latest Ref Rng & Units 11/18/2020    9:56 AM  Hemoglobin A1C  Hemoglobin-A1c <5.7 % of total Hgb 5.5     Assessment/Plan: 1. Pain due to onychomycosis of nail   2. Onycholysis of toenail   3. Type 2 diabetes mellitus without complication, without long-term current use of insulin ([place])     -Examined patient. -Mycotic toenails 2-5 bilaterally and L hallux were debrided in length and girth with sterile nail nippers and dremel without iatrogenic bleeding. -Loose nailplate right great toe gently debrided to level of adherence. Digit cleansed with alcohol. triple antibiotic ointment applied to nailbed followed by light dressing. Patient instructed to apply Neosporin with Pain Relief to right great toe once daily for 7 days. -Dispensed toe cap. Apply to right great toe every morning. Remove every evening. -Patient/POA to call should there be question/concern in the interim.   Return in about 3 months (around 12/26/2021).  Marzetta Board, DPM

## 2021-10-02 ENCOUNTER — Ambulatory Visit: Payer: PPO | Admitting: Psychology

## 2021-10-02 DIAGNOSIS — F0390 Unspecified dementia without behavioral disturbance: Secondary | ICD-10-CM | POA: Diagnosis not present

## 2021-10-02 NOTE — Progress Notes (Signed)
   Neuropsychology Feedback Session [name]. [facility]  Reason for Referral:   [name] is a 88 y.o. right-handed Caucasian male referred by [name], M.D., to characterize his current cognitive functioning and assist with diagnostic clarity and treatment planning in the context of prior concerns surrounding late-onset Alzheimer's disease and ongoing progressive decline.   Feedback:   [name] completed a comprehensive neuropsychological evaluation on 09/29/2021. Please refer to that encounter for the full report and recommendations. Briefly, results suggested profound impairment surrounding all aspects of learning and memory. Impairments were also seen across processing speed and cognitive flexibility. Semantic fluency represented a relative weakness while performance variability was exhibited across complex attention, confrontation naming, and visuospatial abilities. Relative to his previous evaluation in January 2022, declines were noted across processing speed, semantic fluency, confrontation naming, and clock drawing. However, in all cases, these changes were quite subtle and may not yield outward changes in day-to-day functioning. Regarding etiology, primary concerns remain for a late-onset Alzheimer's disease presentation remain. [name] did not benefit from repeated exposure to information across learning trials, was essentially amnestic across all memory tasks after a brief delay, and performed poorly across recognition trials. This suggests evidence for rapid forgetting and a significant memory storage impairment, both of which are hallmark characteristics of this illness. A weakness in semantic fluency (relative to phonemic fluency), variability across confrontation naming, and impaired clock drawing would further align with typical disease trajectory. Given subtle decline in the past 18 months, this would suggest a slow moving  disease process, which is encouraging.   [name] was accompanied by his wife during the current feedback session. Content of the current session focused on the results of his neuropsychological evaluation. [name] was given the opportunity to ask questions and his questions were answered. He was encouraged to reach out should additional questions arise. A copy of his report was provided at the conclusion of the visit.      35 minutes were spent conducting the current feedback session with [name], billed as one unit [phone].

## 2021-10-03 ENCOUNTER — Telehealth: Payer: Self-pay | Admitting: Pharmacist

## 2021-10-03 NOTE — Progress Notes (Signed)
Chronic Care Management Pharmacy Assistant   Name: [name]  MRN: [id number] DOB: 20-Sep-1933   Reason for Encounter: Hypertension Adherence Call    Recent office visits:  None since last CPP visit  Recent consult visits:  10/02/2021 [facility] (Neurology) Hazle Coca, PhD; no medication changes indicated.  09/29/2021 [facility] (Neurology) Hazle Coca, PhD; no medication changes indicated.  09/25/2021 [facility] (Podiatry) Marzetta Board, DPM; no medication changes indicated.  09/06/2021 [facility] (Pulmonology) Hunsucker, Bonna Gains, MD; no medication changes indicated.   Hospital visits:  08/30/2021 ED visit for Acute gastroenteritis -No medication changes noted.  08/27/2021 ED visit for Fever -No medication changes noted.  Medications: Outpatient Encounter Medications as of 10/03/2021  Medication Sig   albuterol (PROAIR HFA) 108 (90 Base) MCG/ACT inhaler INHALE 2 PUFFS INTO THE LUNGS EVERY 6 HOURS AS NEEDED FOR WHEEZING OR SHORTNESS OF BREATH (Patient taking differently: Inhale 2 puffs into the lungs every 6 (six) hours as needed for wheezing or shortness of breath.)   amLODipine (NORVASC) 5 MG tablet Take 1 tablet (5 mg total) by mouth daily.   Apoaequorin (PREVAGEN EXTRA STRENGTH PO) Take 1 tablet by mouth daily.   atorvastatin (LIPITOR) 20 MG tablet Take 1 tablet (20 mg total) by mouth daily.   BREO ELLIPTA 200-25 MCG/ACT AEPB INHALE 1 PUFF DAILY   Calcium Carb-Cholecalciferol (CALCIUM 600+D3 PO) Take 1 tablet by mouth 2 (two) times daily.   cetirizine (ZYRTEC) 5 MG tablet Take 1 tablet (5 mg total) by mouth daily.   doxazosin (CARDURA) 2 MG tablet TAKE 1 TABLET BY MOUTH EVERY DAY   escitalopram (LEXAPRO) 10 MG tablet TAKE 1 TABLET BY MOUTH EVERY DAY   ezetimibe (ZETIA) 10 MG tablet TAKE 1 TABLET BY MOUTH DAILY.   Fluocinolone Acetonide 0.01 % OIL Place in ear(s).   fluticasone (FLONASE) 50 MCG/ACT nasal spray Place 1 spray into both nostrils every evening.   lisinopril (ZESTRIL) 10  MG tablet Take 1 tablet (10 mg total) by mouth daily.   loratadine (CLARITIN) 10 MG tablet Take 10 mg by mouth daily as needed for allergies or rhinitis.    meclizine (ANTIVERT) 12.5 MG tablet Take 1 tablet (12.5 mg total) by mouth 3 (three) times daily as needed for dizziness.   memantine (NAMENDA) 5 MG tablet TAKE 1 TABLET BY MOUTH EVERYDAY AT BEDTIME   memantine (NAMENDA) 5 MG tablet Take 1 tablet (5 mg total) by mouth at bedtime.   Multiple Vitamin (MULTIVITAMIN WITH MINERALS) TABS tablet Take 1 tablet by mouth daily.   ondansetron (ZOFRAN ODT) 8 MG disintegrating tablet Take 1 tablet (8 mg total) by mouth every 8 (eight) hours as needed for nausea or vomiting.   terbinafine (LAMISIL) 250 MG tablet Take 1 tablet (250 mg total) by mouth daily.   No facility-administered encounter medications on file as of 10/03/2021.   Reviewed chart prior to disease state call. Spoke with patient regarding BP  Recent Office Vitals: BP Readings from Last 3 Encounters:  09/06/21 122/64  08/30/21 (!) 150/60  08/27/21 140/61   Pulse Readings from Last 3 Encounters:  09/06/21 68  08/30/21 72  08/27/21 74    Wt Readings from Last 3 Encounters:  09/06/21 184 lb (83.5 kg)  08/30/21 179 lb 14.3 oz (81.6 kg)  08/27/21 180 lb (81.6 kg)     Kidney Function Lab Results  Component Value Date/Time   CREATININE 1.70 (H) 08/30/2021 04:02 AM   CREATININE 1.90 (H) 08/27/2021 06:16 AM   CREATININE 1.78 (  H) 11/18/2020 09:56 AM   CREATININE 1.98 (H) 09/26/2020 11:54 AM   GFRNONAA 39 (L) 08/30/2021 04:02 AM   GFRNONAA 47 (L) 01/19/2020 04:21 PM   GFRAA 54 (L) 01/19/2020 04:21 PM       Latest Ref Rng & Units 08/30/2021    4:02 AM 08/27/2021    6:16 AM 11/18/2020    9:56 AM  BMP  Glucose 70 - 99 mg/dL 89  117  93   BUN 8 - 23 mg/dL 34  45  34   Creatinine 0.61 - 1.24 mg/dL 1.70  1.90  1.78   BUN/Creat Ratio 6 - 22 (calc)   19   Sodium 135 - 145 mmol/L 142  139  141   Potassium 3.5 - 5.1 mmol/L 4.1  4.5   4.3   Chloride 98 - 111 mmol/L 105  105  104   CO2 22 - 32 mmol/L '26  26  30   '$ Calcium 8.9 - 10.3 mg/dL 9.4  9.5  9.5     Current antihypertensive regimen:  Amlodipine 5 mg daily Lisinopril 10 mg daily  How often are you checking your Blood Pressure? infrequently  Current home BP readings: none to report - patient states his blood pressure has always been "good" when he goes in the office so he hasn't been checking it at home as much.  What recent interventions/DTPs have been made by any provider to improve Blood Pressure control since last CPP Visit: No recent interventions or DTPs.  Any recent hospitalizations or ED visits since last visit with CPP? Yes  What diet changes have been made to improve Blood Pressure Control?  Patient states his diet could be better. He hasn't made any recent changes.  What exercise is being done to improve your Blood Pressure Control?  Patient states he does not exercise that much.  Adherence Review: Is the patient currently on ACE/ARB medication? Yes Does the patient have >5 day gap between last estimated fill dates? No  Care Gaps: Medicare Annual Wellness: Declined Ophthalmology Exam: Next due on 06/14/2021 Hemoglobin A1C: 5.5% on 11/18/2020 Colonoscopy: Completed on 04/16/2014  Future Appointments  Date Time Provider [place]  10/16/2021  1:00 PM Rondel Jumbo, PA-C [facility] None  11/20/2021  8:30 AM [name], MD [facility]  01/08/2022  8:45 AM Marzetta Board, DPM [facility]  02/08/2022  2:00 PM [facility] PHARMACIST [facility]   Star Rating Drugs: Atorvastatin 20 mg last filled 08/17/2021 90 DS Lisinopril 10 mg last filled 08/17/2021 90 DS  April D Calhoun, [place] Pharmacist Assistant [phone]

## 2021-10-06 ENCOUNTER — Encounter: Payer: PPO | Admitting: Psychology

## 2021-10-16 ENCOUNTER — Ambulatory Visit: Payer: PPO | Admitting: Physician Assistant

## 2021-10-16 ENCOUNTER — Encounter: Payer: Self-pay | Admitting: Physician Assistant

## 2021-10-16 VITALS — Ht 67.0 in

## 2021-10-16 DIAGNOSIS — F0390 Unspecified dementia without behavioral disturbance: Secondary | ICD-10-CM | POA: Diagnosis not present

## 2021-10-16 NOTE — Progress Notes (Signed)
Assessment/Plan:   Dementia due to Alzheimer's disease [name] is a very pleasant 88 y.o. RH male with a history of hypertension, CAD, diabetes, hyperlipidemia, COPD, DM2,  with a diagnosis of mild dementia due to Alzheimer's disease.  Neuropsych evaluation January 2022 seen today in follow up for memory loss.  MRI of the brain personally reviewed and was remarkable for mild diffuse atrophy and chronic microvascular disease.  Patient is currently on memantine 5 mg nightly.     Follow up in  6 months. Continue memantine 5 mg nightly Repeat Neuropsych evaluation in 18 to 24 months or sooner if decline is noted, to assess the trajectory of the disease and clarity of the diagnosis Continue to monitor driving Entertain attendance to adult day programs for socialization   Case discussed with [name] who agrees with the plan     Subjective:    This patient is accompanied in the office by his wife who supplements the history.  Previous records as well as any outside records available were reviewed prior to todays visit.    Any changes in memory since last visit?  Memory I stable.  Likes to keep statistics of soccer games. He also likes his new book about [employer] as well as doing Catering manager.  Patient lives with: Wife.  He also has a home health nurse coming regularly every month or two.  repeats oneself?  Endorsed Disoriented when walking into a room?  Patient denies but sometimes he asks his wife." Are we going to sleep in the same bedroom?" .  Leaving objects in unusual places?  Patient denies   Ambulates  with difficulty?   Patient denies  Uses the walker "but not always". According to wife he has Improved mobility and determined to be strong.   Recent falls?  Patient denies   Any head injuries?  Patient denies   History of seizures?   Patient denies   Wandering behavior?  One time and went down the stairs , but did not leave the house. Wife got concerned  because he could have fallen down the stairs.   Patient drives?   Patient no longer drives  Any mood changes since last visit?  At times may be a little more argumentative but not often. Any worsening depression?:  Patient denies   Hallucinations?  Sometimes he senses that his mother is with him, does not see her. At times he "smells her cooking" . Sometimes he feels that he is not alone when doing his night prayers.  Paranoia?  Patient denies   Patient reports that sleeps well without vivid dreams, he does have some REM behavior moves a lot at night ", denies sleepwalking   History of sleep apnea?  Endorsed. It is not as bad as before. Has not tolerated the CPAP in the past.  Any hygiene concerns?  Endorsed.  "It is better than before bust still may need to be reminded of it" Independent of bathing and dressing?  Needs assistance. Does the patient needs help with medications?  Denies Who is in charge of the finances? Wife is in charge    Any changes in appetite?  He forgets that he ate, and then he may eat again. He does not eat enough vegetable or drink enough water.   Patient have trouble swallowing? Patient denies   Does the patient cook?  Patient denies   Any kitchen accidents such as leaving the stove on? Endorsed, may use the wrong "irons". He  never cooks alone anymore.   Any headaches?   as a side effect of memantine, not very often Double vision? Patient denies  R eye has cataracts , L eye ok  Any focal numbness or tingling?  Patient denies   Chronic back pain Patient denies   Unilateral weakness?  Patient denies   Any history of anosmia?  Patient denies   Any incontinence of urine?  Patient denies   Any bowel dysfunction?  Bowel incontinence sometimes.  He had a couple of "accidents" in public   Neuropsych evaluation 09/29/21 Dr. Melvyn [name]  Major Neurocognitive Disorder ("dementia") at the present time.   Regarding etiology, primary concerns remain for a late-onset Alzheimer's disease  presentation remain. 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 This suggests evidence for rapid forgetting and a significant memory storage impairment, both of which are hallmark characteristics of this illness. A weakness in semantic fluency (relative to phonemic fluency), variability across confrontation naming, and impaired clock drawing would further align with typical disease trajectory. Given subtle decline in the past 18 months, this would suggest a slow moving disease process, which is encouraging.    There are likely secondary contributions to his overall clinical presentation stemming from his prior small brain bleed/TBI, as well as recent neuroimaging suggesting mild microvascular ischemic disease. These could be largely responsible for deficits surrounding processing speed, complex attention, and cognitive flexibility. Visuospatial variability could be attributed to his TBI given some likely parietal lobe involvement. However, these factors would not explain amnestic memory and overall memory impairment by themselves. As such, I continue to feel that these factors serve to worsen an underlying neurodegenerative illness, most likely late-onset Alzheimer's disease, at the present time.     History on Initial Assessment 03/10/2020: This is a pleasant 88 year old right-handed man with a history of hypertension, CAD, diabetes, hyperlipidemia, presenting for management of Alzheimer's disease. He underwent Neuropsychological testing in last month with findings concerning for AD. Records were reviewed. He had an unwitnessed fall in June 2021 and sustained several fractures and a subarachnoid hemorrhage on the left that did not require surgical intervention. He started reporting forgetfulness at that time. He was discharged home but back in the  hospital a month later with headache, low back pain and fever due to UTI. Head CT showed a left hemispheric subdural hemorrhage, new from prior CT, resolved on follow-up CT in September 2021. He had another fall in November 2021 when he missed a step. He was back in the ER a few weeks later due to increasing confusion and forgetfulness, frequent right-sided headaches, and a clicking noise in his neck, attributed to his recent fall. He was back in the ER again in January 2022 for dizziness, worse when he moves quickly or gets up too quickly, leading to falls. 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 Vestibular therapy helped with dizziness. Neuropsychological evaluation indicated prominent impairment surrounding all aspects of learning and memory, meeting criteria for Major Neurocognitive Disorder, mild end of spectrum, etiology concerning for Alzheimer's disease. There may be a vascular component as well. Prior TBI exacerbated deficits that were already present rather than being primarily responsible for dysfunction.    They report his long-term memory is good, he can remember long passages from  Shakespeare. [name] reports some memory issues prior to the fall, for instance he would recall a story differently or he could not keep an appointment date in his mind or a prior decision they had made. He has not been driving. He manages his own pillbox with a good system, [name] checks behind him and notes he makes mistakes sometimes. He denies any difficulties managing finances, [name] has been managing them since the fall in June and notes there was a notification from the bank prior to his fall. He is fairly independent, [name] stays close during dressing and bathing mostly for safety purposes. He gets dizzy the most when bending down. He uses a cane all the time. He states mood is not bad, "I'm not  miserable." A few times he would ask their daughter where her mother or when [name] is coming back when she is right there. He thinks the daughter who lives with them is a boy, using male pronouns for her, which is new. There have been times he thinks there is a second cat or the cat is a dog, mostly at night. He used to think they had 2 bedrooms.    He denies any headaches. He has occasional vertigo and feels like he would fall, last fall was in January. Dizziness mostly occurs when he gets up, but he has also reported it while sitting or supine. It lasts at most 2 minutes, no nausea/vomiting, vision changes. The other night he woke up and saw nothing except for 2 black rings that he described as full black moons, this has happened twice. He has left trigeminal neuralgia. He was having orthostatic hypotension initially after the fall and they had to stop PT. BP has improved but he still has occasional dizziness. No focal numbness/tingling/weakness. He has neck pain and notes a "clicking" in his neck. Cervical xray showed cervical spondylosis, trace grade 1 anterolisthesis at C2-3, C3-4, C5-6, and C6-7. He has low back pain. No bowel/bladder dysfunction. No anosmia but sometimes smells food when there is none, usually at night. He has mild hand tremors. He has occasional daytime drowsiness, sleeping on his back due to pain, however [name] notes he sounds like he is gasping for breath. He snores sometimes.    Diagnostic Data:    Neuropsychological evaluation in 02/29/2020 with a diagnosis of Major Neurocognitive Disorder, etiology concerning for late-onset Alzheimer's disease. "There was prominent impairment surrounding all aspects of learning and memory. Additional impairments were seen across aspects of executive functioning (i.e., cognitive flexibility and response inhibition), while performance variability was exhibited across attention/concentration, expressive language, and visuospatial abilities. Regarding  his prior TBI, it is more likely that the presence of this injury exacerbated deficits that were already present and worsening due to an underlying cognitive disorder rather than this injury being primarily responsible for the aforementioned dysfunction. His pattern of performance is inconsistent with what would be expected from his TBI alone with memory dysfunction being far more severe. Repeat testing recommended in 12-18 months."   MRI brain with and without contrast done 04/2020 did not show any acute changes. 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    His exam on initial visit also showed hyperreflexia and fasciculations, and he was reporting neck pain. MRI cervical spine did not show any cord abnormalities, there was mild to moderate spinal stenosis and foraminal stenosis at several levels.    EMG/NCV was normal, no evidence of motor  neuron disease.    PREVIOUS MEDICATIONS: Rivastigmine 3 mg twice daily "developed hollow head "    CURRENT MEDICATIONS:  Outpatient Encounter Medications as of 10/16/2021  Medication Sig   albuterol (PROAIR HFA) 108 (90 Base) MCG/ACT inhaler INHALE 2 PUFFS INTO THE LUNGS EVERY 6 HOURS AS NEEDED FOR WHEEZING OR SHORTNESS OF BREATH (Patient taking differently: Inhale 2 puffs into the lungs every 6 (six) hours as needed for wheezing or shortness of breath.)   amLODipine (NORVASC) 5 MG tablet Take 1 tablet (5 mg total) by mouth daily.   Apoaequorin (PREVAGEN EXTRA STRENGTH PO) Take 1 tablet by mouth daily.   atorvastatin (LIPITOR) 20 MG tablet Take 1 tablet (20 mg total) by mouth daily.   BREO ELLIPTA 200-25 MCG/ACT AEPB INHALE 1 PUFF DAILY   Calcium Carb-Cholecalciferol (CALCIUM 600+D3 PO) Take 1 tablet by mouth 2 (two) times daily.   cetirizine (ZYRTEC) 5 MG tablet Take 1 tablet (5 mg total) by mouth daily.   doxazosin (CARDURA) 2 MG tablet TAKE 1 TABLET  BY MOUTH EVERY DAY   escitalopram (LEXAPRO) 10 MG tablet TAKE 1 TABLET BY MOUTH EVERY DAY   ezetimibe (ZETIA) 10 MG tablet TAKE 1 TABLET BY MOUTH DAILY.   Fluocinolone Acetonide 0.01 % OIL Place in ear(s).   fluticasone (FLONASE) 50 MCG/ACT nasal spray Place 1 spray into both nostrils every evening.   lisinopril (ZESTRIL) 10 MG tablet Take 1 tablet (10 mg total) by mouth daily.   loratadine (CLARITIN) 10 MG tablet Take 10 mg by mouth daily as needed for allergies or rhinitis.    meclizine (ANTIVERT) 12.5 MG tablet Take 1 tablet (12.5 mg total) by mouth 3 (three) times daily as needed for dizziness.   memantine (NAMENDA) 5 MG tablet TAKE 1 TABLET BY MOUTH EVERYDAY AT BEDTIME   memantine (NAMENDA) 5 MG tablet Take 1 tablet (5 mg total) by mouth at bedtime.   Multiple Vitamin (MULTIVITAMIN WITH MINERALS) TABS tablet Take 1 tablet by mouth daily.   ondansetron (ZOFRAN ODT) 8 MG disintegrating tablet Take 1 tablet (8 mg total) by mouth every 8 (eight) hours as needed for nausea or vomiting.   terbinafine (LAMISIL) 250 MG tablet Take 1 tablet (250 mg total) by mouth daily.   No facility-administered encounter medications on file as of 10/16/2021.       09/09/2020   12:00 PM  MMSE - Mini Mental State Exam  Orientation to time 1  Orientation to Place 5  Registration 3  Attention/ Calculation 5  Recall 0  Language- name 2 objects 2  Language- repeat 1  Language- follow 3 step command 2  Language- read & follow direction 1  Write a sentence 1  Copy design 0  Total score 21       No data to display          Objective:     PHYSICAL EXAMINATION:    VITALS:   Vitals:   10/16/21 1258  Height: '5\' 7"'$  (1.702 m)    GEN:  The patient appears stated age and is in NAD. HEENT:  Normocephalic, atraumatic.   Neurological examination:  General: NAD, well-groomed, appears stated age. Orientation: The patient is alert. Oriented to person, place and date Cranial nerves: There is good  facial symmetry.The speech is fluent and clear. No aphasia or dysarthria. Fund of knowledge is appropriate. Recent and remote memory are impaired. Attention and concentration are reduced.  Able to name objects and repeat phrases.  Hearing is intact to conversational  tone.    Sensation: Sensation is intact to light touch throughout Motor: Strength is at least antigravity x4. Tremors: none  DTR's 2/4 in UE/LE     Movement examination: Tone: There is normal tone in the UE/LE Abnormal movements:  no tremor.  No myoclonus.  No asterixis.   Coordination:  There is no decremation with RAM's. Normal finger to nose  Gait and Station: The patient has no difficulty arising out of a deep-seated chair without the use of the hands. The patient's stride length is good.  Gait is cautious and narrow.    Thank you for allowing [place] the opportunity to participate in the care of this nice patient. Please do not hesitate to contact us for any questions or concerns.   Total time spent on today's visit was 36 minutes dedicated to this patient today, preparing to see patient, examining the patient, ordering tests and/or medications and counseling the patient, documenting clinical information in the EHR or other health record, independently interpreting results and communicating results to the patient/family, discussing treatment and goals, answering patient's questions and coordinating care.  Cc:  [name], MD  Sharene Butters 10/16/2021 2:37 PM

## 2021-10-16 NOTE — Patient Instructions (Addendum)
It was a pleasure to see you today at our office.   Recommendations:  Follow up March 12 at 11:30  Continue Memantine 5 mg twice daily.    Whom to call:  Memory  decline, memory medications: Call our office [phone]   For psychiatric meds, mood meds: Please have your primary care physician manage these medications.   Counseling regarding caregiver distress, including caregiver depression, anxiety and issues regarding community resources, adult day care programs, adult living facilities, or memory care questions:   Feel free to contact [place], Social Worker at [phone]   For assessment of decision of mental capacity and competency:  Call Dr. Anthoney Harada, geriatric psychiatrist at [phone]  For guidance in geriatric dementia issues please call [facility] [phone]  For guidance regarding [facility] and if placement were needed at the facility, contact Arnell Asal, Social Worker tel: [phone]  If you have any severe symptoms of a stroke, or other severe issues such as confusion,severe chills or fever, etc call 911 or go to the ER as you may need to be evaluated further   Feel free to visit Facebook page " [place]" for tips of how to care for people with memory problems.       RECOMMENDATIONS FOR ALL PATIENTS WITH MEMORY PROBLEMS: 1. Continue to exercise (Recommend 30 minutes of walking everyday, or 3 hours every week) 2. Increase social interactions - continue going to [place] and enjoy social gatherings with friends and family 3. Eat healthy, avoid fried foods and eat more fruits and vegetables 4. Maintain adequate blood pressure, blood sugar, and blood cholesterol level. Reducing the risk of stroke and cardiovascular disease also helps promoting better memory. 5. Avoid stressful situations. Live a simple life and avoid aggravations. Organize your time and prepare for the next day in anticipation. 6. Sleep well,  avoid any interruptions of sleep and avoid any distractions in the bedroom that may interfere with adequate sleep quality 7. Avoid sugar, avoid sweets as there is a strong link between excessive sugar intake, diabetes, and cognitive impairment We discussed the Mediterranean diet, which has been shown to help patients reduce the risk of progressive memory disorders and reduces cardiovascular risk. This includes eating fish, eat fruits and green leafy vegetables, nuts like almonds and hazelnuts, walnuts, and also use olive oil. Avoid fast foods and fried foods as much as possible. Avoid sweets and sugar as sugar use has been linked to worsening of memory function.  There is always a concern of gradual progression of memory problems. If this is the case, then we may need to adjust level of care according to patient needs. Support, both to the patient and caregiver, should then be put into place.    The Alzheimer's Association is here all day, every day for people facing Alzheimer's disease through our free 24/7 Helpline: [phone]. The Helpline provides reliable information and support to all those who need assistance, such as individuals living with memory loss, Alzheimer's or other dementia, caregivers, health care professionals and the public.  Our highly trained and knowledgeable staff can help you with: Understanding memory loss, dementia and Alzheimer's  Medications and other treatment options  General information about aging and brain health  Skills to provide quality care and to find the best care from professionals  Legal, financial and living-arrangement decisions Our Helpline also features: Confidential care consultation provided by master's level clinicians who can help with decision-making support, crisis assistance and education on issues families  face every day  Help in a caller's preferred language using our translation service that features more than 200 languages and dialects   Referrals to local community programs, services and ongoing support     FALL PRECAUTIONS: Be cautious when walking. Scan the area for obstacles that may increase the risk of trips and falls. When getting up in the mornings, sit up at the edge of the bed for a few minutes before getting out of bed. Consider elevating the bed at the head end to avoid drop of blood pressure when getting up. Walk always in a well-lit room (use night lights in the walls). Avoid area rugs or power cords from appliances in the middle of the walkways. Use a walker or a cane if necessary and consider physical therapy for balance exercise. Get your eyesight checked regularly.  FINANCIAL OVERSIGHT: Supervision, especially oversight when making financial decisions or transactions is also recommended.  HOME SAFETY: Consider the safety of the kitchen when operating appliances like stoves, microwave oven, and blender. Consider having supervision and share cooking responsibilities until no longer able to participate in those. Accidents with firearms and other hazards in the house should be identified and addressed as well.   ABILITY TO BE LEFT ALONE: If patient is unable to contact 911 operator, consider using LifeLine, or when the need is there, arrange for someone to stay with patients. Smoking is a fire hazard, consider supervision or cessation. Risk of wandering should be assessed by caregiver and if detected at any point, supervision and safe proof recommendations should be instituted.  MEDICATION SUPERVISION: Inability to self-administer medication needs to be constantly addressed. Implement a mechanism to ensure safe administration of the medications.   DRIVING: Regarding driving, in patients with progressive memory problems, driving will be impaired. We advise to have someone else do the driving if trouble finding directions or if minor accidents are reported. Independent driving assessment is available to determine  safety of driving.   If you are interested in the driving assessment, you can contact the following:  [employer] in [place]  [place] [phone] or [phone]      [place] refers to food and lifestyle choices that are based on the traditions of countries located on the [employer]. This way of eating has been shown to help prevent certain conditions and improve outcomes for people who have chronic diseases, like kidney disease and heart disease. What are tips for following this plan? Lifestyle  Cook and eat meals together with your family, when possible. Drink enough fluid to keep your urine clear or pale yellow. Be physically active every day. This includes: Aerobic exercise like running or swimming. Leisure activities like gardening, walking, or housework. Get 7-8 hours of sleep each night. If recommended by your health care provider, drink red wine in moderation. This means 1 glass a day for nonpregnant women and 2 glasses a day for men. A glass of wine equals 5 oz (150 mL). Reading food labels  Check the serving size of packaged foods. For foods such as rice and pasta, the serving size refers to the amount of cooked product, not dry. Check the total fat in packaged foods. Avoid foods that have saturated fat or trans fats. Check the ingredients list for added sugars, such as corn syrup. Shopping  At the grocery store, buy most of your food from the areas near the walls of  the store. This includes: Fresh fruits and vegetables (produce). Grains, beans, nuts, and seeds. Some of these may be available in unpackaged forms or large amounts (in bulk). Fresh seafood. Poultry and eggs. Low-fat dairy products. Buy whole ingredients instead of prepackaged foods. Buy fresh fruits and vegetables in-season from local farmers  markets. Buy frozen fruits and vegetables in resealable bags. If you do not have access to quality fresh seafood, buy precooked frozen shrimp or canned fish, such as tuna, salmon, or sardines. Buy small amounts of raw or cooked vegetables, salads, or olives from the deli or salad bar at your store. Stock your pantry so you always have certain foods on hand, such as olive oil, canned tuna, canned tomatoes, rice, pasta, and beans. Cooking  Cook foods with extra-virgin olive oil instead of using butter or other vegetable oils. Have meat as a side dish, and have vegetables or grains as your main dish. This means having meat in small portions or adding small amounts of meat to foods like pasta or stew. Use beans or vegetables instead of meat in common dishes like chili or lasagna. Experiment with different cooking methods. Try roasting or broiling vegetables instead of steaming or sauteing them. Add frozen vegetables to soups, stews, pasta, or rice. Add nuts or seeds for added healthy fat at each meal. You can add these to yogurt, salads, or vegetable dishes. Marinate fish or vegetables using olive oil, lemon juice, garlic, and fresh herbs. Meal planning  Plan to eat 1 vegetarian meal one day each week. Try to work up to 2 vegetarian meals, if possible. Eat seafood 2 or more times a week. Have healthy snacks readily available, such as: Vegetable sticks with hummus. Greek yogurt. Fruit and nut trail mix. Eat balanced meals throughout the week. This includes: Fruit: 2-3 servings a day Vegetables: 4-5 servings a day Low-fat dairy: 2 servings a day Fish, poultry, or lean meat: 1 serving a day Beans and legumes: 2 or more servings a week Nuts and seeds: 1-2 servings a day Whole grains: 6-8 servings a day Extra-virgin olive oil: 3-4 servings a day Limit red meat and sweets to only a few servings a month What are my food choices? Mediterranean diet Recommended Grains: Whole-grain pasta. Brown  rice. Bulgar wheat. Polenta. Couscous. Whole-wheat bread. [place]. Vegetables: Artichokes. Beets. Broccoli. Cabbage. Carrots. Eggplant. Green beans. Chard. Kale. Spinach. Onions. Leeks. Peas. Squash. Tomatoes. Peppers. Radishes. Fruits: Apples. Apricots. Avocado. Berries. Bananas. Cherries. Dates. Figs. Grapes. Lemons. Melon. Oranges. Peaches. Plums. Pomegranate. Meats and other protein foods: Beans. Almonds. Sunflower seeds. Pine nuts. Peanuts. Prompton. Salmon. Scallops. Shrimp. [place]. Tilapia. Clams. Oysters. Eggs. Dairy: Low-fat milk. Cheese. Greek yogurt. Beverages: Water. Red wine. Herbal tea. Fats and oils: Extra virgin olive oil. Avocado oil. Grape seed oil. Sweets and desserts: [place] yogurt with honey. Baked apples. Poached pears. Trail mix. Seasoning and other foods: Basil. Cilantro. Coriander. Cumin. Mint. Parsley. Sage. Rosemary. Tarragon. Garlic. Oregano. Thyme. Pepper. Balsalmic vinegar. Tahini. Hummus. Tomato sauce. Olives. Mushrooms. Limit these Grains: Prepackaged pasta or rice dishes. Prepackaged cereal with added sugar. Vegetables: Deep fried potatoes (french fries). Fruits: Fruit canned in syrup. Meats and other protein foods: Beef. Pork. Lamb. Poultry with skin. Hot dogs. Bernie[place] Salines. Dairy: Ice cream. Sour cream. Whole milk. Beverages: Juice. Sugar-sweetened soft drinks. Beer. Liquor and spirits. Fats and oils: Butter. Canola oil. Vegetable oil. Beef fat (tallow). Lard. Sweets and desserts: Cookies. Cakes. Pies. Candy. Seasoning and other foods: Mayonnaise. Premade sauces and marinades. The items listed may  not be a complete list. Talk with your dietitian about what dietary choices are right for you. Summary The Mediterranean diet includes both food and lifestyle choices. Eat a variety of fresh fruits and vegetables, beans, nuts, seeds, and whole grains. Limit the amount of red meat and sweets that you eat. Talk with your health care provider about whether it is safe for you  to drink red wine in moderation. This means 1 glass a day for nonpregnant women and 2 glasses a day for men. A glass of wine equals 5 oz (150 mL). This information is not intended to replace advice given to you by your health care provider. Make sure you discuss any questions you have with your health care provider. Document Released: 09/15/2015 Document Revised: 10/18/2015 Document Reviewed: 09/15/2015 Elsevier Interactive Patient Education  2017 [employer].

## 2021-10-23 ENCOUNTER — Other Ambulatory Visit: Payer: Self-pay | Admitting: Family Medicine

## 2021-10-30 ENCOUNTER — Telehealth: Payer: Self-pay | Admitting: *Deleted

## 2021-10-30 NOTE — Telephone Encounter (Signed)
error 

## 2021-11-01 ENCOUNTER — Ambulatory Visit (INDEPENDENT_AMBULATORY_CARE_PROVIDER_SITE_OTHER): Payer: PPO

## 2021-11-01 ENCOUNTER — Ambulatory Visit: Payer: PPO | Admitting: Podiatry

## 2021-11-01 DIAGNOSIS — T1490XA Injury, unspecified, initial encounter: Secondary | ICD-10-CM

## 2021-11-01 NOTE — Progress Notes (Signed)
Chief Complaint  Patient presents with   right foot swelling    Patient is here complaining of right ankle pain and swelling, the patient injured the ankle over this past weekend.    HPI: 88 y.o. male presenting today for new complaint of pain and tenderness with swelling to the right lower extremity.  Patient states that on Sunday, 10/29/2021, he experienced pain extending through his right lower extremity from the hip extending to the knee extending to the ankle and foot.  Since that time the pain has resolved significantly.  He currently has no pain to the area but presents for evaluation.  He denies a history of injury.  He has not done anything for treatment  Past Medical History:  Diagnosis Date   Acute blood loss anemia    Acute upper respiratory infections of unspecified site 11/04/2012   AKI (acute kidney injury)    Benign prostatic hyperplasia    CAD (coronary artery disease)    s/p cypher DES to pLAD 6/08; normal LVF;  ETT-Myoview 2009: no ischemia    Clavicle fracture 08/10/2019   Closed displaced fracture of phalanx of left thumb, sequela 08/10/2019   Coronary atherosclerosis of native coronary artery 03/08/2008   Dizziness 03/08/2020   Eosinophilia    Essential hypertension    Fracture of multiple ribs with pain 08/10/2019   Hematoma 11/04/2012   History of multiple falls    History of SAH (subarachnoid hemorrhage) 07/14/2019   Hyperlipidemia type IIB / III 03/08/2008   Itching of ear 07/14/2021   Major neurocognitive disorder due to possible Alzheimer's disease, without behavioral disturbance 02/29/2020   MI (myocardial infarction)    Moderate persistent asthma    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   Imaging revealed [facility]   Multiple trauma    Nocturnal leg cramps 11/23/2012   Orthostatic hypotension 03/08/2008   Otalgia of left ear 05/11/2021   Otorrhagia of left ear 03/30/2021   Pain in joint, shoulder region 11/23/2012   Stage 3b  chronic kidney disease    Thrombocytopenia    Trigeminal neuralgia    Type 2 diabetes mellitus     Past Surgical History:  Procedure Laterality Date   APPENDECTOMY     CARDIAC CATHETERIZATION  07/30/2006   CORONARY ANGIOPLASTY WITH STENT PLACEMENT   CARDIAC CATHETERIZATION     EXPLORATORY LAPAROTOMY     age 36   EXPLORATORY LAPAROTOMY     IR THORACENTESIS ASP PLEURAL SPACE W/IMG GUIDE  10/05/2019    Allergies  Allergen Reactions   Gabapentin Other (See Comments)    Visual changes and confusion- "I went blind while I was driving"     Physical Exam: General: The patient is alert and oriented x3 in no acute distress.  Dermatology: Skin is warm, dry and supple bilateral lower extremities. Negative for open lesions or macerations.  Vascular: Palpable pedal pulses bilaterally.  Capillary refill immediate.  There is chronic mild to moderate edema noted bilateral lower extremities  Neurological: Light touch and protective threshold grossly intact  Musculoskeletal Exam: No pedal deformities noted.  Negative for any significant tenderness with palpation or range of motion.  Muscle strength 5/5 all compartments  Radiographic Exam:  Normal osseous mineralization. Joint spaces preserved. No fracture/dislocation/boney destruction.    Assessment: 1.  Capsulitis right ankle; improved   Plan of Care:  1. Patient evaluated. X-Rays reviewed.  2.  Recommend conservative treatment.  Patient may continue wearing diabetic shoes.  Weightbearing as tolerated 3.  Recommend moderate compression hose daily bilateral lower extremities 4.  Return to clinic neck scheduled appointment for routine foot care     Edrick Kins, DPM [facility]  Dr. Edrick Kins, DPM    [address], [place] [address]                Office [phone]  Fax [phone]

## 2021-11-01 NOTE — Progress Notes (Signed)
G

## 2021-11-03 DIAGNOSIS — F0390 Unspecified dementia without behavioral disturbance: Secondary | ICD-10-CM | POA: Diagnosis not present

## 2021-11-08 ENCOUNTER — Other Ambulatory Visit: Payer: Self-pay | Admitting: Family Medicine

## 2021-11-08 NOTE — Telephone Encounter (Signed)
Courtesy refill. Future visit in 1 week.  Requested Prescriptions  Pending Prescriptions Disp Refills  . BREO ELLIPTA 200-25 MCG/ACT AEPB [Pharmacy Med Name: BREO ELLIPTA 200-25 MCG INH] 60 each 0    Sig: INHALE 1 PUFF DAILY *NEED APPT*     Pulmonology:  Combination Products Passed - 11/08/2021  9:55 AM      Passed - Valid encounter within last 12 months    Recent Outpatient Visits          11 months ago Viral URI   [place] Pickard, Cammie Mcgee, MD   11 months ago Hordeolum externum of left upper eyelid   [place] Eulogio Bear, NP   1 year ago Fever, unspecified fever cause   [place] Pickard, Cammie Mcgee, MD   1 year ago Sebaceous cyst   [place] [name], MD   1 year ago Itching of ear   [place] Pickard, Cammie Mcgee, MD      Future Appointments            In 1 week Pickard, Cammie Mcgee, MD [place], [facility]

## 2021-11-16 ENCOUNTER — Other Ambulatory Visit: Payer: Self-pay

## 2021-11-16 NOTE — Telephone Encounter (Signed)
Pharmacy faxed a refill request for lisinopril (ZESTRIL) 10 MG tablet [[id number]]    Order Details Dose: 10 mg Route: Oral Frequency: Daily  Dispense Quantity: 90 tablet Refills: 3        Sig: Take 1 tablet (10 mg total) by mouth daily.       Start Date: 11/24/20 End Date: --  Written Date: 11/24/20 Expiration Date: 11/24/21

## 2021-11-17 MED ORDER — LISINOPRIL 10 MG PO TABS: 10.0000 mg | ORAL_TABLET | Freq: Every day | ORAL | 0 refills | Status: DC

## 2021-11-17 NOTE — Telephone Encounter (Signed)
Requested Prescriptions  Pending Prescriptions Disp Refills  . lisinopril (ZESTRIL) 10 MG tablet 90 tablet 3    Sig: Take 1 tablet (10 mg total) by mouth daily.     Cardiovascular:  ACE Inhibitors Failed - 11/16/2021  2:32 PM      Failed - Cr in normal range and within 180 days    Creat  Date Value Ref Range Status  11/18/2020 1.78 (H) 0.70 - 1.22 mg/dL Final   Creatinine, Ser  Date Value Ref Range Status  08/30/2021 1.70 (H) 0.61 - 1.24 mg/dL Final   Creatinine, Urine  Date Value Ref Range Status  11/18/2020 106 20 - 320 mg/dL Final         Failed - Valid encounter within last 6 months    Recent Outpatient Visits          11 months ago Viral URI   [place] [name], MD   12 months ago Hordeolum externum of left upper eyelid   [place] Eulogio Bear, NP   1 year ago Fever, unspecified fever cause   [place] Pickard, Cammie Mcgee, MD   1 year ago Sebaceous cyst   [place] Denn[facility] Schaumann Cammie Mcgee, MD   1 year ago Itching of ear   [place] [name], MD      Future Appointments            In 3 days [name], MD [place], [facility]           Passed - K in normal range and within 180 days    Potassium  Date Value Ref Range Status  08/30/2021 4.1 3.5 - 5.1 mmol/L Final         Passed - Patient is not pregnant      Passed - Last BP in normal range    BP Readings from Last 1 Encounters:  09/06/21 122/64

## 2021-11-20 ENCOUNTER — Encounter: Payer: PPO | Admitting: Family Medicine

## 2021-11-27 ENCOUNTER — Encounter: Payer: Self-pay | Admitting: Physician Assistant

## 2021-11-30 ENCOUNTER — Telehealth: Payer: Self-pay | Admitting: Pharmacist

## 2021-11-30 NOTE — Chronic Care Management (AMB) (Signed)
Patient's wife called with recent BP readings concerned about elevation.  One week average recently was 142/66.  He did have one or two very high readings with the highest being one morning 169/73 with associated headache.  She was inquiring about possibly adjusting BP meds.  Also concern for fall risk.  Counseled on BP goals.  Just want to confirm she should continue to monitor at this point and let us know if consistently > 140/90.  Beverly Milch, PharmD, CPP Clinical Pharmacist Practitioner [place] [phone]

## 2021-12-07 ENCOUNTER — Other Ambulatory Visit: Payer: Self-pay | Admitting: Neurology

## 2021-12-07 ENCOUNTER — Other Ambulatory Visit: Payer: Self-pay | Admitting: Family Medicine

## 2021-12-07 DIAGNOSIS — F0390 Unspecified dementia without behavioral disturbance: Secondary | ICD-10-CM

## 2021-12-07 NOTE — Telephone Encounter (Signed)
Requested medication (s) are due for refill today - yes  Requested medication (s) are on the active medication list -yes  Future visit scheduled -no  Last refill: 11/08/21 #60  Notes to clinic: last RF courtesy RF- no upcoming appointment- request sent for review   Requested Prescriptions  Pending Prescriptions Disp Refills   BREO ELLIPTA 200-25 MCG/ACT AEPB [Pharmacy Med Name: BREO ELLIPTA 200-25 MCG INHALR] 60 each 0    Sig: INHALE 1 PUFF BY MOUTH DAILY *NEED APPT*     Pulmonology:  Combination Products Failed - 12/07/2021  2:17 AM      Failed - Valid encounter within last 12 months    Recent Outpatient Visits           12 months ago Viral URI   [place] Pickard, Cammie Mcgee, MD   1 year ago Hordeolum externum of left upper eyelid   [place] Eulogio Bear, NP   1 year ago Fever, unspecified fever cause   [place] [facility] Pickard, Cammie Mcgee, MD   1 year ago Sebaceous cyst   [place] Schaumann, Cammie Mcgee, MD   1 year ago Itching of ear   [place] Pickard, Cammie Mcgee, MD                 Requested Prescriptions  Pending Prescriptions Disp Refills   BREO ELLIPTA 200-25 MCG/ACT AEPB [Pharmacy Med Name: BREO ELLIPTA 200-25 MCG INHALR] 60 each 0    Sig: INHALE 1 PUFF BY MOUTH DAILY *NEED APPT*     Pulmonology:  Combination Products Failed - 12/07/2021  2:17 AM      Failed - Valid encounter within last 12 months    Recent Outpatient Visits           12 months ago Viral URI   [place] Denn[place] Schaumann, Cammie Mcgee, MD   1 year ago Hordeolum externum of left upper eyelid   [place] Eulogio Bear, NP   1 year ago Fever, unspecified fever cause   [place] Pickard, Cammie Mcgee, MD   1 year ago Sebaceous cyst   [place] [name], MD   1 year ago Itching of ear   [place] Pickard, Cammie Mcgee, MD

## 2021-12-07 NOTE — Telephone Encounter (Signed)
Requested medication (s) are due for refill today:   Yes  Requested medication (s) are on the active medication list:   Yes  Future visit scheduled:   No   Last ordered: 11/08/2021 #60 each, 0 refills  Returned because a courtesy refill already given, Pt. Cancelled 11/20/2021 appt. And no upcoming appt. Noted.   Provider to review for refills.     Requested Prescriptions  Pending Prescriptions Disp Refills   BREO ELLIPTA 200-25 MCG/ACT AEPB [Pharmacy Med Name: BREO ELLIPTA 200-25 MCG INHALR] 60 each 0    Sig: INHALE 1 PUFF BY MOUTH DAILY *NEED APPT*     Pulmonology:  Combination Products Failed - 12/07/2021 12:49 PM      Failed - Valid encounter within last 12 months    Recent Outpatient Visits           12 months ago Viral URI   [place] Pickard, Cammie Mcgee, MD   1 year ago Hordeolum externum of left upper eyelid   [place] Eulogio Bear, NP   1 year ago Fever, unspecified fever cause   [facility] Pickard, Cammie Mcgee, MD   1 year ago Sebaceous cyst   [place] [name], MD   1 year ago Itching of ear   [facility] Pickard, Cammie Mcgee, MD

## 2021-12-11 ENCOUNTER — Other Ambulatory Visit: Payer: Self-pay | Admitting: Family Medicine

## 2021-12-11 NOTE — Telephone Encounter (Signed)
  Prescription Request  12/11/2021  Is this a "Controlled Substance" medicine? No  LOV: 12/07/2021   What is the name of the medication or equipment?   BREO ELLIPTA 200-25 MCG/ACT AEPB   Have you contacted your pharmacy to request a refill? Yes   Which pharmacy would you like this sent to?  [id number] [place], [place] [id number]: [phone]: [phone]  Patient notified that their request is being sent to the clinical staff for review and that they should receive a response within 2 business days.   **Patient has 3 days of medication left.**  Please advise at  [phone]

## 2021-12-12 MED ORDER — FLUTICASONE FUROATE-VILANTEROL 200-25 MCG/ACT IN AEPB: INHALATION_SPRAY | RESPIRATORY_TRACT | 0 refills | Status: DC

## 2021-12-12 NOTE — Telephone Encounter (Signed)
Requested Prescriptions  Pending Prescriptions Disp Refills   fluticasone furoate-vilanterol (BREO ELLIPTA) 200-25 MCG/ACT AEPB 60 each 0    Sig: INHALE 1 PUFF DAILY *NEED APPT*     Pulmonology:  Combination Products Failed - 12/11/2021  4:16 PM      Failed - Valid encounter within last 12 months    Recent Outpatient Visits           1 year ago Viral URI   [place] Dennard Schaumann, Cammie Mcgee, MD   1 year ago Hordeolum externum of left upper eyelid   [place] Eulogio Bear, NP   1 year ago Fever, unspecified fever cause   [place] Pickard, Cammie Mcgee, MD   1 year ago Sebaceous cyst   [place] [name], MD   1 year ago Itching of ear   [place] Pickard, Cammie Mcgee, MD       Future Appointments             In 3 days Pickard, Cammie Mcgee, MD [place], [facility]

## 2021-12-15 ENCOUNTER — Ambulatory Visit (INDEPENDENT_AMBULATORY_CARE_PROVIDER_SITE_OTHER): Payer: PPO | Admitting: Family Medicine

## 2021-12-15 ENCOUNTER — Other Ambulatory Visit (INDEPENDENT_AMBULATORY_CARE_PROVIDER_SITE_OTHER): Payer: PPO

## 2021-12-15 VITALS — BP 130/70 | HR 79 | Temp 97.9°F | Ht 67.0 in | Wt 191.0 lb

## 2021-12-15 DIAGNOSIS — N1832 Chronic kidney disease, stage 3b: Secondary | ICD-10-CM | POA: Diagnosis not present

## 2021-12-15 DIAGNOSIS — I10 Essential (primary) hypertension: Secondary | ICD-10-CM | POA: Diagnosis not present

## 2021-12-15 DIAGNOSIS — R0602 Shortness of breath: Secondary | ICD-10-CM

## 2021-12-15 DIAGNOSIS — Z23 Encounter for immunization: Secondary | ICD-10-CM | POA: Diagnosis not present

## 2021-12-15 DIAGNOSIS — E119 Type 2 diabetes mellitus without complications: Secondary | ICD-10-CM

## 2021-12-15 NOTE — Progress Notes (Signed)
Subjective:    Patient ID: [name], male    DOB: [birth year], 88 y.o.   MRN: [id number]  HPI  Patient is a very frail 88 year old Caucasian gentleman with a history of coronary artery disease, chronic kidney disease with a GFR approaching 30, and dementia.   Wt Readings from Last 3 Encounters:  12/15/21 191 lb (86.6 kg)  09/06/21 184 lb (83.5 kg)  08/30/21 179 lb 14.3 oz (81.6 kg)   Patient has pitting edema in both legs.  He denies any chest pain or shortness of breath however his wife states that he becomes winded more easily.  His last echocardiogram was a few years ago that showed ejection fraction of 50%.  He denies any orthopnea or paroxysmal nocturnal dyspnea.  Blood pressure today is excellent at 130/70.  He is due for a flu shot as well as a COVID booster. Past Medical History:  Diagnosis Date   Acute blood loss anemia    Acute upper respiratory infections of unspecified site 11/04/2012   AKI (acute kidney injury)    Benign prostatic hyperplasia    CAD (coronary artery disease)    s/p cypher DES to pLAD 6/08; normal LVF;  ETT-Myoview 2009: no ischemia    Clavicle fracture 08/10/2019   Closed displaced fracture of phalanx of left thumb, sequela 08/10/2019   Coronary atherosclerosis of native coronary artery 03/08/2008   Dizziness 03/08/2020   Eosinophilia    Essential hypertension    Fracture of multiple ribs with pain 08/10/2019   Hematoma 11/04/2012   History of multiple falls    History of SAH (subarachnoid hemorrhage) 07/14/2019   Hyperlipidemia type IIB / III 03/08/2008   Itching of ear 07/14/2021   Major neurocognitive disorder due to possible Alzheimer's disease, without behavioral disturbance 02/29/2020   MI (myocardial infarction)    Moderate persistent asthma    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   Imaging revealed [facility]   Multiple trauma    Nocturnal leg cramps 11/23/2012   Orthostatic hypotension 03/08/2008   Otalgia  of left ear 05/11/2021   Otorrhagia of left ear 03/30/2021   Pain in joint, shoulder region 11/23/2012   Stage 3b chronic kidney disease    Thrombocytopenia    Trigeminal neuralgia    Type 2 diabetes mellitus    Past Surgical History:  Procedure Laterality Date   APPENDECTOMY     CARDIAC CATHETERIZATION  07/30/2006   CORONARY ANGIOPLASTY WITH STENT PLACEMENT   CARDIAC CATHETERIZATION     EXPLORATORY LAPAROTOMY     age 16   EXPLORATORY LAPAROTOMY     IR THORACENTESIS ASP PLEURAL SPACE W/IMG GUIDE  10/05/2019   Current Outpatient Medications on File Prior to Visit  Medication Sig Dispense Refill   albuterol (PROAIR HFA) 108 (90 Base) MCG/ACT inhaler INHALE 2 PUFFS INTO THE LUNGS EVERY 6 HOURS AS NEEDED FOR WHEEZING OR SHORTNESS OF BREATH (Patient taking differently: Inhale 2 puffs into the lungs every 6 (six) hours as needed for wheezing or shortness of breath.) 6.7 g 0   amLODipine (NORVASC) 5 MG tablet Take 1 tablet (5 mg total) by mouth daily. 90 tablet 3   atorvastatin (LIPITOR) 20 MG tablet Take 1 tablet (20 mg total) by mouth daily. 90 tablet 3   Calcium Carb-Cholecalciferol (CALCIUM 600+D3 PO) Take 1 tablet by mouth 2 (two) times daily.     cetirizine (ZYRTEC) 5 MG tablet Take 1 tablet (5 mg total) by mouth daily. 30 tablet 3  doxazosin (CARDURA) 2 MG tablet TAKE 1 TABLET BY MOUTH EVERY DAY 90 tablet 3   escitalopram (LEXAPRO) 10 MG tablet TAKE 1 TABLET BY MOUTH EVERY DAY 90 tablet 2   ezetimibe (ZETIA) 10 MG tablet TAKE 1 TABLET BY MOUTH DAILY. 90 tablet 3   Fluocinolone Acetonide 0.01 % OIL Place in ear(s).     fluticasone (FLONASE) 50 MCG/ACT nasal spray Place 1 spray into both nostrils every evening. 16 g 2   fluticasone furoate-vilanterol (BREO ELLIPTA) 200-25 MCG/ACT AEPB INHALE 1 PUFF DAILY *NEED APPT* 60 each 0   lisinopril (ZESTRIL) 10 MG tablet Take 1 tablet (10 mg total) by mouth daily. 90 tablet 0   loratadine (CLARITIN) 10 MG tablet Take 10 mg by mouth daily as needed  for allergies or rhinitis.      meclizine (ANTIVERT) 12.5 MG tablet Take 1 tablet (12.5 mg total) by mouth 3 (three) times daily as needed for dizziness. 15 tablet 0   memantine (NAMENDA) 5 MG tablet TAKE 1 TABLET BY MOUTH EVERYDAY AT BEDTIME 90 tablet 0   Multiple Vitamin (MULTIVITAMIN WITH MINERALS) TABS tablet Take 1 tablet by mouth daily.     ondansetron (ZOFRAN ODT) 8 MG disintegrating tablet Take 1 tablet (8 mg total) by mouth every 8 (eight) hours as needed for nausea or vomiting. 10 tablet 0   Apoaequorin (PREVAGEN EXTRA STRENGTH PO) Take 1 tablet by mouth daily. (Patient not taking: Reported on 12/15/2021)     memantine (NAMENDA) 5 MG tablet TAKE 1 TABLET BY MOUTH EVERYDAY AT BEDTIME (Patient not taking: Reported on 12/15/2021) 90 tablet 0   terbinafine (LAMISIL) 250 MG tablet Take 1 tablet (250 mg total) by mouth daily. (Patient not taking: Reported on 12/15/2021) 90 tablet 3   No current facility-administered medications on file prior to visit.   Allergies  Allergen Reactions   Gabapentin Other (See Comments)    Visual changes and confusion- "I went blind while I was driving"   Social History   Socioeconomic History   Marital status: Married    Spouse name: [name]   Number of children: 1   Years of education: 20   Highest education level: Occupational hygienist History   Occupation: retired professor    Comment: History    Occupation: missionary  Tobacco Use   Smoking status: Never   Smokeless tobacco: Never  Scientific laboratory technician Use: Never used  Substance and Sexual Activity   Alcohol use: Not Currently   Drug use: Never   Sexual activity: Yes    Partners: Female  Other Topics Concern   Not on file  Social History Narrative   ** Merged History Encounter **       Lives with his wife (second marriage in 2015, first marriage ended when his wife died alzheimer's disease). Since his remarriage, his adult daughter doesn't speak with him.      Right handed   Social  Determinants of Health   Financial Resource Strain: Low Risk  (11/11/2020)   Overall Financial Resource Strain (CARDIA)    Difficulty of Paying Living Expenses: Not very hard  Food Insecurity: Not on file  Transportation Needs: Not on file  Physical Activity: Not on file  Stress: Not on file  Social Connections: Not on file  Intimate Partner Violence: Not on file   Family History  Problem Relation Age of Onset   Stomach cancer Mother    Memory loss Mother    Heart disease Father  Coronary artery disease Father 56   Heart attack Father    Heart disease Brother    Aortic aneurysm Brother    Colon cancer Brother    Aortic aneurysm Brother    Colon cancer Brother    Arthritis Daughter        rheumatoid   Coronary artery disease Brother    Esophageal cancer Neg Hx    Rectal cancer Neg Hx      Review of Systems  All other systems reviewed and are negative.      Objective:   Physical Exam Vitals reviewed.  Constitutional:      General: He is not in acute distress.    Appearance: He is well-developed. He is not diaphoretic.  HENT:     Right Ear: Tympanic membrane and ear canal normal.     Left Ear: Tympanic membrane and ear canal normal.  Neck:     Thyroid: No thyromegaly.     Vascular: No carotid bruit or JVD.     Trachea: No tracheal deviation.  Cardiovascular:     Rate and Rhythm: Normal rate and regular rhythm.     Heart sounds: Normal heart sounds. No murmur heard.    No friction rub. No gallop.  Pulmonary:     Effort: Pulmonary effort is normal. No accessory muscle usage, prolonged expiration or respiratory distress.     Breath sounds: No stridor. No wheezing or rales.  Musculoskeletal:     Cervical back: Neck supple. No erythema, rigidity, torticollis or crepitus. No pain with movement, spinous process tenderness or muscular tenderness. Normal range of motion.     Right lower leg: Edema present.     Left lower leg: Edema present.  Lymphadenopathy:      Cervical: No cervical adenopathy.  Neurological:     Mental Status: He is alert.     Motor: No abnormal muscle tone.     Deep Tendon Reflexes: Reflexes are normal and symmetric.           Assessment & Plan:  Essential hypertension  Type 2 diabetes mellitus without complication, without long-term current use of insulin ([place]) - Plan: CBC with Differential/Platelet, Lipid panel, COMPLETE METABOLIC PANEL WITH GFR, Hemoglobin A1c, Ambulatory referral to Ophthalmology  Stage 3b chronic kidney disease ([place])  Shortness of breath - Plan: Brain natriuretic peptide I am very concerned by the shortness of breath and leg swelling.  Check a BNP.  If the BNP is elevated this would be a sign of congestive heart failure.  At that point I would discontinue amlodipine and lisinopril and replace with Entresto and order an echocardiogram.  Check CBC CMP and lipid panel along with A1c to monitor the management of his diabetes.  Blood pressure today is outstanding.  If his BNP is elevated, consider Jardiance.  Patient received his flu shot today

## 2021-12-16 LAB — COMPLETE METABOLIC PANEL WITH GFR
AG Ratio: 1.7 (calc) (ref 1.0–2.5)
ALT: 19 U/L (ref 9–46)
AST: 33 U/L (ref 10–35)
Albumin: 4 g/dL (ref 3.6–5.1)
Alkaline phosphatase (APISO): 82 U/L (ref 35–144)
BUN/Creatinine Ratio: 18 (calc) (ref 6–22)
BUN: 34 mg/dL — ABNORMAL HIGH (ref 7–25)
CO2: 31 mmol/L (ref 20–32)
Calcium: 9.3 mg/dL (ref 8.6–10.3)
Chloride: 104 mmol/L (ref 98–110)
Creat: 1.94 mg/dL — ABNORMAL HIGH (ref 0.70–1.22)
Globulin: 2.3 g/dL (calc) (ref 1.9–3.7)
Glucose, Bld: 150 mg/dL — ABNORMAL HIGH (ref 65–99)
Potassium: 5.1 mmol/L (ref 3.5–5.3)
Sodium: 140 mmol/L (ref 135–146)
Total Bilirubin: 0.5 mg/dL (ref 0.2–1.2)
Total Protein: 6.3 g/dL (ref 6.1–8.1)
eGFR: 33 mL/min/{1.73_m2} — ABNORMAL LOW (ref >=60)

## 2021-12-16 LAB — CBC WITH DIFFERENTIAL/PLATELET
Absolute Monocytes: 466 cells/uL (ref 200–950)
Basophils Absolute: 19 cells/uL (ref 0–200)
Basophils Relative: 0.4 %
Eosinophils Absolute: 192 cells/uL (ref 15–500)
Eosinophils Relative: 4 %
HCT: 31.7 % — ABNORMAL LOW (ref 38.5–50.0)
Hemoglobin: 10.8 g/dL — ABNORMAL LOW (ref 13.2–17.1)
Lymphs Abs: 950 cells/uL (ref 850–3900)
MCH: 32.5 pg (ref 27.0–33.0)
MCHC: 34.1 g/dL (ref 32.0–36.0)
MCV: 95.5 fL (ref 80.0–100.0)
MPV: 9.7 fL (ref 7.5–12.5)
Monocytes Relative: 9.7 %
Neutro Abs: 3173 cells/uL (ref 1500–7800)
Neutrophils Relative %: 66.1 %
Platelets: 183 10*3/uL (ref 140–400)
RBC: 3.32 10*6/uL — ABNORMAL LOW (ref 4.20–5.80)
RDW: 11.9 % (ref 11.0–15.0)
Total Lymphocyte: 19.8 %
WBC: 4.8 10*3/uL (ref 3.8–10.8)

## 2021-12-16 LAB — BRAIN NATRIURETIC PEPTIDE: Brain Natriuretic Peptide: 146 pg/mL — ABNORMAL HIGH (ref <100)

## 2021-12-16 LAB — HEMOGLOBIN A1C
Hgb A1c MFr Bld: 6 % of total Hgb — ABNORMAL HIGH (ref <5.7)
Mean Plasma Glucose: 126 mg/dL
eAG (mmol/L): 7 mmol/L

## 2021-12-16 LAB — LIPID PANEL
Cholesterol: 134 mg/dL (ref <200)
HDL: 60 mg/dL (ref >=40)
LDL Cholesterol (Calc): 52 mg/dL (calc)
Non-HDL Cholesterol (Calc): 74 mg/dL (calc) (ref <130)
Total CHOL/HDL Ratio: 2.2 (calc) (ref <5.0)
Triglycerides: 135 mg/dL (ref <150)

## 2021-12-18 ENCOUNTER — Other Ambulatory Visit: Payer: Self-pay | Admitting: Family Medicine

## 2021-12-18 DIAGNOSIS — R0602 Shortness of breath: Secondary | ICD-10-CM

## 2021-12-18 DIAGNOSIS — R7989 Other specified abnormal findings of blood chemistry: Secondary | ICD-10-CM

## 2021-12-22 ENCOUNTER — Other Ambulatory Visit: Payer: Self-pay | Admitting: Family Medicine

## 2021-12-25 ENCOUNTER — Telehealth: Payer: Self-pay | Admitting: Cardiovascular Disease

## 2021-12-25 DIAGNOSIS — Z79899 Other long term (current) drug therapy: Secondary | ICD-10-CM

## 2021-12-25 MED ORDER — FUROSEMIDE 40 MG PO TABS: 40.0000 mg | ORAL_TABLET | ORAL | 3 refills | Status: DC

## 2021-12-25 NOTE — Telephone Encounter (Signed)
Stop amlodipine. Start furosemide 40 mg every other day. BMET 2 weeks. thanks

## 2021-12-25 NOTE — Telephone Encounter (Signed)
Returned call to patient who states that his abdomen, ankles and feet swelling over the course of the last several months and has recently worsened in that it does not recede overnight. Has multiple sized pants, but has found that he usually always wears sweat pants at home. [name] states "he looks pregnant." Is able to still get on socks and shoes, but they are noticeably tighter and states "the ruts they leave are deeper and not going away overnight like they used to." Gained 5lbs since August. Patient states he has developed a hacking, non-productive, cough that's becoming almost constant (previously was sporadic and had attributed to allergies). Shortness of breath is much more apparent and happens with minimal activity  (describes bending over to put socks on or a short walk gets him out of breath). Dizziness occurs each morning upon awakening. Condones that it also occurs after sitting for long periods. Passes within a minute or so.Still taking amlodipine '5mg'$  daily and lisinopril '10mg'$  daily. Does not check pressures or weight at home to give exact data. Spoke with PCP who ordered BNP (came back 146) and an ECHO, echo not scheduled until 12/6. States they have pushing fluids, advised them to hold on this. Drink as tolerated and exercise him a few times a day for short periods. Elevate legs while sitting. Advised we'd likely change his Amlodipine, but will route to MD for advisement.

## 2021-12-25 NOTE — Telephone Encounter (Signed)
Pt c/o swelling: STAT is pt has developed SOB within 24 hours  If swelling, where is the swelling located? Lower belly, ankles, and feet   How much weight have you gained and in what time span? Unsure   Have you gained 3 pounds in a day or 5 pounds in a week? No   Do you have a log of your daily weights (if so, list)? No, doesn't have a scale  Are you currently taking a fluid pill? No   Are you currently SOB? No, but does have SOB on exertion   Have you traveled recently? No    Patient also has a cough and dizziness. This has been occurring for the past few weeks if not a month. Dizziness mostly occurs when first waking up.

## 2021-12-25 NOTE — Telephone Encounter (Signed)
Returned call to patient and wife karen (via speakerphone) who verbalized understanding to stop Amlodipine and begin lasix '40mg'$  QOD. BMET scheduled for 01/08/22. Will call us back if any additional concerns.

## 2022-01-08 ENCOUNTER — Ambulatory Visit: Payer: PPO | Attending: Cardiovascular Disease

## 2022-01-08 ENCOUNTER — Ambulatory Visit: Payer: PPO | Admitting: Podiatry

## 2022-01-08 ENCOUNTER — Ambulatory Visit (INDEPENDENT_AMBULATORY_CARE_PROVIDER_SITE_OTHER): Payer: PPO | Admitting: Cardiology

## 2022-01-08 ENCOUNTER — Encounter: Payer: Self-pay | Admitting: Podiatry

## 2022-01-08 ENCOUNTER — Encounter: Payer: Self-pay | Admitting: Cardiology

## 2022-01-08 VITALS — BP 147/64

## 2022-01-08 VITALS — BP 140/70 | HR 69 | Ht 67.0 in | Wt 196.0 lb

## 2022-01-08 DIAGNOSIS — E119 Type 2 diabetes mellitus without complications: Secondary | ICD-10-CM

## 2022-01-08 DIAGNOSIS — R600 Localized edema: Secondary | ICD-10-CM

## 2022-01-08 DIAGNOSIS — B351 Tinea unguium: Secondary | ICD-10-CM | POA: Diagnosis not present

## 2022-01-08 DIAGNOSIS — M79609 Pain in unspecified limb: Secondary | ICD-10-CM | POA: Diagnosis not present

## 2022-01-08 DIAGNOSIS — I10 Essential (primary) hypertension: Secondary | ICD-10-CM | POA: Diagnosis not present

## 2022-01-08 DIAGNOSIS — I2510 Atherosclerotic heart disease of native coronary artery without angina pectoris: Secondary | ICD-10-CM

## 2022-01-08 DIAGNOSIS — Z79899 Other long term (current) drug therapy: Secondary | ICD-10-CM | POA: Diagnosis not present

## 2022-01-08 NOTE — Patient Instructions (Signed)
Medication Instructions:  The current medical regimen is effective;  continue present plan and medications.  *If you need a refill on your cardiac medications before your next appointment, please call your pharmacy*   Lab Work: none If you have labs (blood work) drawn today and your tests are completely normal, you will receive your results only by: [place] (if you have MyChart) OR A paper copy in the mail If you have any lab test that is abnormal or we need to change your treatment, we will call you to review the results.   Testing/Procedures: As ordered  Follow-Up: At [facility], you and your health needs are our priority.  As part of our continuing mission to provide you with exceptional heart care, we have created designated Provider Care Teams.  These Care Teams include your primary Cardiologist (physician) and Advanced Practice Providers (APPs -  Physician Assistants and Nurse Practitioners) who all work together to provide you with the care you need, when you need it.  We recommend signing up for the patient portal called "MyChart".  Sign up information is provided on this After Visit Summary.  MyChart is used to connect with patients for Virtual Visits (Telemedicine).  Patients are able to view lab/test results, encounter notes, upcoming appointments, etc.  Non-urgent messages can be sent to your provider as well.   To learn more about what you can do with MyChart, go to [email or web address].    Your next appointment:   Follow up with Dr [name] as scheduled.  Important Information About Sugar

## 2022-01-08 NOTE — Progress Notes (Unsigned)
ANNUAL DIABETIC FOOT EXAM  Subjective: [name] presents today {jgcomplaint:23593}.  Chief Complaint  Patient presents with   Nail Problem    Diabetic foot ? BS- do not check today A1C-6.0 PCP-[place] PCP VST- 3 Weeks ago    Patient confirms h/o diabetes.  Patient relates {Numbers; 0-100:15068} year h/o diabetes.  Patient denies any h/o foot wounds.  Patient has h/o foot ulcer of {jgPodToeLocator:23637}, which healed via help of ***.  Patient admits symptoms of foot numbness.   Patient admits symptoms of foot tingling.  Patient admits symptoms of burning in feet.  Patient admits symptoms of pins/needles sensation in feet.  Patient denies any numbness, tingling, burning, or pins/needle sensation in feet.  Patient has been diagnosed with neuropathy and it is managed with {JGNEUROPATHYMEDS:27053}.  Patient's blood sugar was *** mg/dl {Time; today/yesterday/ 2 days ago:19188}. Last known  HgA1c was ***%   Patient did not check blood glucose this morning.  Patient does not monitor blood glucose daily.  Risk factors: {jgriskfactors:24044}.  [name], MD is patient's PCP. Last visit was {Time; dates multiple:15870}***.  Past Medical History:  Diagnosis Date   Acute blood loss anemia    Acute upper respiratory infections of unspecified site 11/04/2012   AKI (acute kidney injury)    Benign prostatic hyperplasia    CAD (coronary artery disease)    s/p cypher DES to pLAD 6/08; normal LVF;  ETT-Myoview 2009: no ischemia    Clavicle fracture 08/10/2019   Closed displaced fracture of phalanx of left thumb, sequela 08/10/2019   Coronary atherosclerosis of native coronary artery 03/08/2008   Dizziness 03/08/2020   Eosinophilia    Essential hypertension    Fracture of multiple ribs with pain 08/10/2019   Hematoma 11/04/2012   History of multiple falls    History of SAH (subarachnoid hemorrhage) 07/14/2019   Hyperlipidemia type IIB / III 03/08/2008    Itching of ear 07/14/2021   Major neurocognitive disorder due to possible Alzheimer's disease, without behavioral disturbance 02/29/2020   MI (myocardial infarction)    Moderate persistent asthma    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   Imaging revealed [facility]   Multiple trauma    Nocturnal leg cramps 11/23/2012   Orthostatic hypotension 03/08/2008   Otalgia of left ear 05/11/2021   Otorrhagia of left ear 03/30/2021   Pain in joint, shoulder region 11/23/2012   Stage 3b chronic kidney disease    Thrombocytopenia    Trigeminal neuralgia    Type 2 diabetes mellitus    Patient Active Problem List   Diagnosis Date Noted   Itching of ear 07/14/2021   Otalgia of left ear 05/11/2021   Otorrhagia of left ear 03/30/2021   Dizziness 03/08/2020   Major neurocognitive disorder due to possible Alzheimer's disease, without behavioral disturbance 02/29/2020   CAD (coronary artery disease)    MI (myocardial infarction)    Trigeminal neuralgia    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    Multiple trauma    Benign prostatic hyperplasia    Essential hypertension    Stage 3b chronic kidney disease ([place])    Thrombocytopenia    History of multiple falls 07/14/2019   History of SAH (subarachnoid hemorrhage) 07/14/2019   Type 2 diabetes mellitus    Eosinophilia    Nocturnal leg cramps 11/23/2012   Hematoma 11/04/2012   Hyperlipidemia type IIB / III 03/08/2008   Orthostatic hypotension 03/08/2008   Coronary atherosclerosis of native coronary artery 03/08/2008  Past Surgical History:  Procedure Laterality Date   APPENDECTOMY     CARDIAC CATHETERIZATION  07/30/2006   CORONARY ANGIOPLASTY WITH STENT PLACEMENT   CARDIAC CATHETERIZATION     EXPLORATORY LAPAROTOMY     age 62   EXPLORATORY LAPAROTOMY     IR THORACENTESIS ASP PLEURAL SPACE W/IMG GUIDE  10/05/2019   Current Outpatient Medications on File Prior to Visit  Medication Sig Dispense  Refill   albuterol (PROAIR HFA) 108 (90 Base) MCG/ACT inhaler INHALE 2 PUFFS INTO THE LUNGS EVERY 6 HOURS AS NEEDED FOR WHEEZING OR SHORTNESS OF BREATH (Patient taking differently: Inhale 2 puffs into the lungs every 6 (six) hours as needed for wheezing or shortness of breath.) 6.7 g 0   Apoaequorin (PREVAGEN EXTRA STRENGTH PO) Take 1 tablet by mouth daily. (Patient not taking: Reported on 12/15/2021)     atorvastatin (LIPITOR) 20 MG tablet Take 1 tablet (20 mg total) by mouth daily. 90 tablet 3   Calcium Carb-Cholecalciferol (CALCIUM 600+D3 PO) Take 1 tablet by mouth 2 (two) times daily.     cetirizine (ZYRTEC) 5 MG tablet Take 1 tablet (5 mg total) by mouth daily. 30 tablet 3   doxazosin (CARDURA) 2 MG tablet TAKE 1 TABLET BY MOUTH EVERY DAY 30 tablet 0   escitalopram (LEXAPRO) 10 MG tablet TAKE 1 TABLET BY MOUTH EVERY DAY 90 tablet 2   ezetimibe (ZETIA) 10 MG tablet TAKE 1 TABLET BY MOUTH DAILY. 90 tablet 3   Fluocinolone Acetonide 0.01 % OIL Place in ear(s).     fluticasone (FLONASE) 50 MCG/ACT nasal spray Place 1 spray into both nostrils every evening. 16 g 2   fluticasone furoate-vilanterol (BREO ELLIPTA) 200-25 MCG/ACT AEPB INHALE 1 PUFF DAILY *NEED APPT* 60 each 0   furosemide (LASIX) 40 MG tablet Take 1 tablet (40 mg total) by mouth every other day. 45 tablet 3   lisinopril (ZESTRIL) 10 MG tablet Take 1 tablet (10 mg total) by mouth daily. 90 tablet 0   loratadine (CLARITIN) 10 MG tablet Take 10 mg by mouth daily as needed for allergies or rhinitis.      meclizine (ANTIVERT) 12.5 MG tablet Take 1 tablet (12.5 mg total) by mouth 3 (three) times daily as needed for dizziness. 15 tablet 0   memantine (NAMENDA) 5 MG tablet TAKE 1 TABLET BY MOUTH EVERYDAY AT BEDTIME 90 tablet 0   Multiple Vitamin (MULTIVITAMIN WITH MINERALS) TABS tablet Take 1 tablet by mouth daily.     No current facility-administered medications on file prior to visit.    Allergies  Allergen Reactions   Gabapentin Other  (See Comments)    Visual changes and confusion- "I went blind while I was driving"   Social History   Occupational History   Occupation: retired professor    Comment: History    Occupation: missionary  Tobacco Use   Smoking status: Never   Smokeless tobacco: Never  Scientific laboratory technician Use: Never used  Substance and Sexual Activity   Alcohol use: Not Currently   Drug use: Never   Sexual activity: Yes    Partners: Female   Family History  Problem Relation Age of Onset   Stomach cancer Mother    Memory loss Mother    Heart disease Father    Coronary artery disease Father 35   Heart attack Father    Heart disease Brother    Aortic aneurysm Brother    Colon cancer Brother    Aortic aneurysm Brother  Colon cancer Brother    Arthritis Daughter        rheumatoid   Coronary artery disease Brother    Esophageal cancer Neg Hx    Rectal cancer Neg Hx    Immunization History  Administered Date(s) Administered   Fluad Quad(high Dose 65+) 11/10/2020, 12/15/2021   Influenza Split 10/06/2012   Influenza, High Dose Seasonal PF 12/02/2016, 10/26/2018, 10/27/2018, 10/23/2019   Influenza,inj,Quad PF,6+ Mos 11/12/2013, 11/11/2014, 10/20/2015, 11/12/2017   Moderna Sars-Covid-2 Vaccination 03/04/2019, 04/03/2019   Pfizer Covid-19 Vaccine Bivalent Booster 19yr & up 11/10/2020   Pneumococcal Conjugate-13 03/17/2013   Pneumococcal Polysaccharide-23 02/05/2001   Td 03/01/2009   Zoster, Live 02/25/2008     Review of Systems: Negative except as noted in the HPI.   Objective: Vitals:   01/08/22 0902  BP: (!) 147/64   RCALLAHAN WILDis a pleasant 88y.o. male in NAD. AAO X 3.  Vascular Examination: {jgvascular:23595}  Dermatological Examination: {jgderm:23598}  Neurological Examination: {jgneuro:23601::"Protective sensation intact 5/5 intact bilaterally with 10g monofilament b/l.","Vibratory sensation intact b/l.","Proprioception intact bilaterally."}  Musculoskeletal  Examination: {jgmsk:23600}  Footwear Assessment: Does the patient wear appropriate shoes? {Yes,No}. Does the patient need inserts/orthotics? {Yes,No}.  ADA Risk Categorization: Low Risk :  Patient has all of the following: Intact protective sensation No prior foot ulcer  No severe deformity Pedal pulses present  High Risk  Patient has one or more of the following: Loss of protective sensation Absent pedal pulses Severe Foot deformity History of foot ulcer  Assessment: No diagnosis found.   Plan: No orders of the defined types were placed in this encounter.   No orders of the defined types were placed in this encounter.   None  {jgplan:23602::"-Patient/POA to call should there be question/concern in the interim."} Return in about 3 months (around 04/09/2022).  JMarzetta Board DPM

## 2022-01-08 NOTE — Progress Notes (Signed)
Cardiology Office Note:    Date:  01/08/2022   ID:  [name], DOB 1933-08-29, MRN [id number]  PCP:  [name], MD   [facility] Providers Cardiologist:  Sherren Mocha, MD     Referring MD: [name], MD     History of Present Illness:    [name] is a 88 y.o. male here for follow-up Lasix 40 mg every other day for ankle edema lower extremity edema and recent stopping of Lasix.  His legs look better than after he states.  Took his basic metabolic profile today awaiting results.  They will come to Dr. [name].    According to Dr. Antionette Char prior notes: coronary artery disease with history of LAD stenting in 2008. Comorbid conditions include mild bilateral carotid artery stenosis, hypertension, hyperlipidemia, and stage III chronic kidney disease.    Patient underwent PCI with DES to proximal LAD in 2008 with a Cypher DES. He was also noted to have moderate residual coronary disease at that time for which medical therapy was recommended. He has done well since then with no recurrent ischemic events. He did have an exercise treadmill test in 2014 which demonstrated no significant ST/T changes. Patient also has known mild bilateral carotid artery stenosis with most recent carotid dopplers on 11/13/2018 showing 1-39% stenosis of bilateral ICAs.  His wife noted O2 93%, increased confusion. Lasix has helped. Pain shoulder blades worried her. ECHO scheduled for Wed. Legs are much better. Waist-hard to get get in pants.   Past Medical History:  Diagnosis Date   Acute blood loss anemia    Acute upper respiratory infections of unspecified site 11/04/2012   AKI (acute kidney injury)    Benign prostatic hyperplasia    CAD (coronary artery disease)    s/p cypher DES to pLAD 6/08; normal LVF;  ETT-Myoview 2009: no ischemia    Clavicle fracture 08/10/2019   Closed displaced fracture of phalanx of left thumb, sequela 08/10/2019   Coronary atherosclerosis of native  coronary artery 03/08/2008   Dizziness 03/08/2020   Eosinophilia    Essential hypertension    Fracture of multiple ribs with pain 08/10/2019   Hematoma 11/04/2012   History of multiple falls    History of SAH (subarachnoid hemorrhage) 07/14/2019   Hyperlipidemia type IIB / III 03/08/2008   Itching of ear 07/14/2021   Major neurocognitive disorder due to possible Alzheimer's disease, without behavioral disturbance 02/29/2020   MI (myocardial infarction)    Moderate persistent asthma    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   Imaging revealed [facility]   Multiple trauma    Nocturnal leg cramps 11/23/2012   Orthostatic hypotension 03/08/2008   Otalgia of left ear 05/11/2021   Otorrhagia of left ear 03/30/2021   Pain in joint, shoulder region 11/23/2012   Stage 3b chronic kidney disease    Thrombocytopenia    Trigeminal neuralgia    Type 2 diabetes mellitus     Past Surgical History:  Procedure Laterality Date   APPENDECTOMY     CARDIAC CATHETERIZATION  07/30/2006   CORONARY ANGIOPLASTY WITH STENT PLACEMENT   CARDIAC CATHETERIZATION     EXPLORATORY LAPAROTOMY     age 68   EXPLORATORY LAPAROTOMY     IR THORACENTESIS ASP PLEURAL SPACE W/IMG GUIDE  10/05/2019    Current Medications: Current Meds  Medication Sig   albuterol (PROAIR HFA) 108 (90 Base) MCG/ACT inhaler INHALE 2 PUFFS INTO THE LUNGS EVERY 6 HOURS AS NEEDED FOR  WHEEZING OR SHORTNESS OF BREATH   Apoaequorin (PREVAGEN EXTRA STRENGTH PO) Take 1 tablet by mouth daily.   atorvastatin (LIPITOR) 20 MG tablet Take 1 tablet (20 mg total) by mouth daily.   doxazosin (CARDURA) 2 MG tablet TAKE 1 TABLET BY MOUTH EVERY DAY   escitalopram (LEXAPRO) 10 MG tablet TAKE 1 TABLET BY MOUTH EVERY DAY   ezetimibe (ZETIA) 10 MG tablet TAKE 1 TABLET BY MOUTH DAILY.   fluticasone furoate-vilanterol (BREO ELLIPTA) 200-25 MCG/ACT AEPB INHALE 1 PUFF DAILY *NEED APPT*   furosemide (LASIX) 40 MG tablet Take 1 tablet (40 mg  total) by mouth every other day.   lisinopril (ZESTRIL) 10 MG tablet Take 1 tablet (10 mg total) by mouth daily.   loratadine (CLARITIN) 10 MG tablet Take 10 mg by mouth daily as needed for allergies or rhinitis.    meclizine (ANTIVERT) 12.5 MG tablet Take 1 tablet (12.5 mg total) by mouth 3 (three) times daily as needed for dizziness.   memantine (NAMENDA) 5 MG tablet TAKE 1 TABLET BY MOUTH EVERYDAY AT BEDTIME   Multiple Vitamin (MULTIVITAMIN WITH MINERALS) TABS tablet Take 1 tablet by mouth daily.     Allergies:   Gabapentin   Social History   Socioeconomic History   Marital status: Married    Spouse name: [name]   Number of children: 1   Years of education: 20   Highest education level: Occupational hygienist History   Occupation: retired professor    Comment: History    Occupation: missionary  Tobacco Use   Smoking status: Never   Smokeless tobacco: Never  Scientific laboratory technician Use: Never used  Substance and Sexual Activity   Alcohol use: Not Currently   Drug use: Never   Sexual activity: Yes    Partners: Female  Other Topics Concern   Not on file  Social History Narrative   ** Merged History Encounter **       Lives with his wife (second marriage in 2015, first marriage ended when his wife died alzheimer's disease). Since his remarriage, his adult daughter doesn't speak with him.      Right handed   Social Determinants of Health   Financial Resource Strain: Low Risk  (11/11/2020)   Overall Financial Resource Strain (CARDIA)    Difficulty of Paying Living Expenses: Not very hard  Food Insecurity: Not on file  Transportation Needs: Not on file  Physical Activity: Not on file  Stress: Not on file  Social Connections: Not on file     Family History: The patient's family history includes Aortic aneurysm in his brother and brother; Arthritis in his daughter; Colon cancer in his brother and brother; Coronary artery disease in his brother; Coronary artery disease (age  of onset: 74) in his father; Heart attack in his father; Heart disease in his brother and father; Memory loss in his mother; Stomach cancer in his mother. There is no history of Esophageal cancer or Rectal cancer.  ROS:   Please see the history of present illness.    All other systems reviewed and are negative.  EKGs/Labs/Other Studies Reviewed:    The following studies were reviewed today: Echo 10/16/19:  1. Left ventricular ejection fraction, by estimation, is 50 to 55%. The  left ventricle has low normal function. The left ventricle demonstrates  regional wall motion abnormalities (see scoring diagram/findings for  description). Left ventricular diastolic   parameters are consistent with Grade I diastolic dysfunction (impaired  relaxation). There is probable moderate  hypokinesis of the left  ventricular, basal inferolateral wall. Technically challenging study.   2. Right ventricular systolic function is normal. The right ventricular  size is normal. There is normal pulmonary artery systolic pressure.   3. A small pericardial effusion is present. The pericardial effusion is  circumferential. There is no evidence of cardiac tamponade.   4. The mitral valve is normal in structure. No evidence of mitral valve  regurgitation.   5. The aortic valve is grossly normal. Aortic valve regurgitation is not  visualized.   6. The inferior vena cava is normal in size with greater than 50%  respiratory variability, suggesting right atrial pressure of 3 mmHg.   Carotid US 2021: Summary:  Right Carotid: Velocities in the right ICA are consistent with a 1-39%  stenosis.   Left Carotid: Velocities in the left ICA are consistent with a 1-39%  stenosis.                Non-hemodynamically significant plaque <50% noted in the  CCA.   Vertebrals:  Right vertebral artery demonstrates antegrade flow. Left  vertebral               artery demonstrates no discernable flow.  Subclavians: Normal flow  hemodynamics were seen in bilateral subclavian               arteries.   EKG:  EKG normal sinus rhythm 69 with left axis deviation.  Recent Labs: 12/15/2021: ALT 19; Brain Natriuretic Peptide 146; BUN 34; Creat 1.94; Hemoglobin 10.8; Platelets 183; Potassium 5.1; Sodium 140  Recent Lipid Panel    Component Value Date/Time   CHOL 134 12/15/2021 0845   CHOL 129 04/26/2019 0730   TRIG 135 12/15/2021 0845   HDL 60 12/15/2021 0845   HDL 58 04/26/2019 0730   CHOLHDL 2.2 12/15/2021 0845   VLDL 23 07/29/2019 1340   LDLCALC 52 12/15/2021 0845     Risk Assessment/Calculations:           Physical Exam:    VS:  BP (!) 140/70 (BP Location: Left Arm, Patient Position: Sitting, Cuff Size: Normal)   Pulse 69   Ht '5\' 7"'$  (1.702 m)   Wt 196 lb (88.9 kg)   BMI 30.70 kg/m     Wt Readings from Last 3 Encounters:  01/08/22 196 lb (88.9 kg)  12/15/21 191 lb (86.6 kg)  09/06/21 184 lb (83.5 kg)     GEN:  Well nourished, well developed elderly male in no acute distress HEENT: Normal NECK: No JVD; No carotid bruits LYMPHATICS: No lymphadenopathy CARDIAC: RRR, 2/6 SEM at the RUSB RESPIRATORY:  Clear to auscultation without rales, wheezing or rhonchi  ABDOMEN: Soft, non-tender, non-distended MUSCULOSKELETAL:  No edema; No deformity  SKIN: Warm and dry NEUROLOGIC:  Alert and oriented x 3 PSYCHIATRIC:  Normal affect   ASSESSMENT:    1. Coronary artery disease involving native coronary artery of native heart without angina pectoris   2. Essential hypertension     PLAN:    In order of problems listed above:  Lower extremity edema - Much improved with Lasix 40 mg every other day.  Awaiting basic metabolic profile.  Last creatinine was in the 1.9 range.  Continue with current plan. - Since stopping amlodipine this is also helped his lower extremity edema.  Hypertension - Blood pressure reasonable currently.  He is off of amlodipine.  Hyperlipidemia - Zetia atorvastatin LDL  78  Chronic kidney disease stage IIIb - Continue avoid NSAIDs.  Watching with Lasix  Willing to tolerate a slightly higher creatinine at this point given his fluid status.  Mild pleural effusion left side  - Lasix.  Personally reviewed x-ray.        Medication Adjustments/Labs and Tests Ordered: Current medicines are reviewed at length with the patient today.  Concerns regarding medicines are outlined above.  Orders Placed This Encounter  Procedures   EKG 12-Lead   No orders of the defined types were placed in this encounter.   Patient Instructions  Medication Instructions:  The current medical regimen is effective;  continue present plan and medications.  *If you need a refill on your cardiac medications before your next appointment, please call your pharmacy*   Lab Work: none If you have labs (blood work) drawn today and your tests are completely normal, you will receive your results only by: [place] (if you have MyChart) OR A paper copy in the mail If you have any lab test that is abnormal or we need to change your treatment, we will call you to review the results.   Testing/Procedures: As ordered  Follow-Up: At [facility], you and your health needs are our priority.  As part of our continuing mission to provide you with exceptional heart care, we have created designated Provider Care Teams.  These Care Teams include your primary Cardiologist (physician) and Advanced Practice Providers (APPs -  Physician Assistants and Nurse Practitioners) who all work together to provide you with the care you need, when you need it.  We recommend signing up for the patient portal called "MyChart".  Sign up information is provided on this After Visit Summary.  MyChart is used to connect with patients for Virtual Visits (Telemedicine).  Patients are able to view lab/test results, encounter notes, upcoming appointments, etc.  Non-urgent messages can be sent to your  provider as well.   To learn more about what you can do with MyChart, go to [email or web address].    Your next appointment:   Follow up with Dr [name] as scheduled.  Important Information About Sugar         Signed, Candee Furbish, MD  01/08/2022 5:09 PM    [facility]

## 2022-01-09 LAB — BASIC METABOLIC PANEL
BUN/Creatinine Ratio: 22 (ref 10–24)
BUN: 43 mg/dL — ABNORMAL HIGH (ref 8–27)
CO2: 23 mmol/L (ref 20–29)
Calcium: 9.3 mg/dL (ref 8.6–10.2)
Chloride: 105 mmol/L (ref 96–106)
Creatinine, Ser: 1.97 mg/dL — ABNORMAL HIGH (ref 0.76–1.27)
Glucose: 86 mg/dL (ref 70–99)
Potassium: 4.5 mmol/L (ref 3.5–5.2)
Sodium: 141 mmol/L (ref 134–144)
eGFR: 32 mL/min/{1.73_m2} — ABNORMAL LOW (ref >59)

## 2022-01-10 ENCOUNTER — Encounter: Payer: Self-pay | Admitting: Podiatry

## 2022-01-10 ENCOUNTER — Ambulatory Visit (HOSPITAL_COMMUNITY): Payer: PPO | Attending: Cardiovascular Disease

## 2022-01-10 ENCOUNTER — Other Ambulatory Visit: Payer: Self-pay | Admitting: Family Medicine

## 2022-01-10 DIAGNOSIS — R7989 Other specified abnormal findings of blood chemistry: Secondary | ICD-10-CM | POA: Insufficient documentation

## 2022-01-10 DIAGNOSIS — R0602 Shortness of breath: Secondary | ICD-10-CM | POA: Diagnosis not present

## 2022-01-10 LAB — ECHOCARDIOGRAM COMPLETE
Area-P 1/2: 1.84 cm2
P 1/2 time: 457 msec
S' Lateral: 3.5 cm

## 2022-01-10 NOTE — Telephone Encounter (Signed)
Unable to refill per protocol, Rx request is too soon. Last refill  12/25/21 for 60. Will refuse.  Requested Prescriptions  Pending Prescriptions Disp Refills   BREO ELLIPTA 200-25 MCG/ACT AEPB [Pharmacy Med Name: BREO ELLIPTA 200-25 MCG INHALR] 60 each 0    Sig: INHALE 1 PUFF DAILY *NEED APPT*     Pulmonology:  Combination Products Failed - 01/10/2022  1:31 AM      Failed - Valid encounter within last 12 months    Recent Outpatient Visits           1 year ago Viral URI   [place] Pickard, Cammie Mcgee, MD   1 year ago Hordeolum externum of left upper eyelid   [place] Eulogio Bear, NP   1 year ago Fever, unspecified fever cause   [facility] Pickard, Cammie Mcgee, MD   1 year ago Sebaceous cyst   [place] [name], MD   1 year ago Itching of ear   [facility] Pickard, Cammie Mcgee, MD

## 2022-01-11 ENCOUNTER — Encounter: Payer: Self-pay | Admitting: Family Medicine

## 2022-01-11 ENCOUNTER — Encounter: Payer: Self-pay | Admitting: Cardiovascular Disease

## 2022-01-11 DIAGNOSIS — N183 Chronic kidney disease, stage 3 unspecified: Secondary | ICD-10-CM

## 2022-01-11 DIAGNOSIS — I2510 Atherosclerotic heart disease of native coronary artery without angina pectoris: Secondary | ICD-10-CM

## 2022-01-15 ENCOUNTER — Telehealth: Payer: Self-pay | Admitting: Pharmacist

## 2022-01-15 NOTE — Progress Notes (Signed)
Chronic Care Management Pharmacy Assistant   Name: [name]  MRN: [id number] DOB: 01/08/1934   Reason for Encounter: Hypertension Adherence Call    Recent office visits:  12/15/2021 OV (PCP) [name] Frizzle, MD; At that point I would discontinue amlodipine and lisinopril and replace with Entresto and order an echocardiogram. If his BNP is elevated, consider Jardiance.   Recent consult visits:  01/08/2022 OV (Cardiology) [name] Pain, MD; no medication changes indicated.  01/08/2022 OV (Podiatry) [name], DPM; no medication changes indicated.  Hospital visits:  None since last adherence call  Medications: Outpatient Encounter Medications as of 01/15/2022  Medication Sig   albuterol (PROAIR HFA) 108 (90 Base) MCG/ACT inhaler INHALE 2 PUFFS INTO THE LUNGS EVERY 6 HOURS AS NEEDED FOR WHEEZING OR SHORTNESS OF BREATH   Apoaequorin (PREVAGEN EXTRA STRENGTH PO) Take 1 tablet by mouth daily.   atorvastatin (LIPITOR) 20 MG tablet Take 1 tablet (20 mg total) by mouth daily.   doxazosin (CARDURA) 2 MG tablet TAKE 1 TABLET BY MOUTH EVERY DAY   escitalopram (LEXAPRO) 10 MG tablet TAKE 1 TABLET BY MOUTH EVERY DAY   ezetimibe (ZETIA) 10 MG tablet TAKE 1 TABLET BY MOUTH DAILY.   fluticasone furoate-vilanterol (BREO ELLIPTA) 200-25 MCG/ACT AEPB INHALE 1 PUFF DAILY *NEED APPT*   furosemide (LASIX) 40 MG tablet Take 1 tablet (40 mg total) by mouth every other day.   lisinopril (ZESTRIL) 10 MG tablet Take 1 tablet (10 mg total) by mouth daily.   loratadine (CLARITIN) 10 MG tablet Take 10 mg by mouth daily as needed for allergies or rhinitis.    meclizine (ANTIVERT) 12.5 MG tablet Take 1 tablet (12.5 mg total) by mouth 3 (three) times daily as needed for dizziness.   memantine (NAMENDA) 5 MG tablet TAKE 1 TABLET BY MOUTH EVERYDAY AT BEDTIME   Multiple Vitamin (MULTIVITAMIN WITH MINERALS) TABS tablet Take 1 tablet by mouth daily.   No facility-administered encounter  medications on file as of 01/15/2022.   Reviewed chart prior to disease state call. Spoke with patient regarding BP  Recent Office Vitals: BP Readings from Last 3 Encounters:  01/08/22 (!) 140/70  01/08/22 (!) 147/64  12/15/21 130/70   Pulse Readings from Last 3 Encounters:  01/08/22 69  12/15/21 79  09/06/21 68    Wt Readings from Last 3 Encounters:  01/08/22 196 lb (88.9 kg)  12/15/21 191 lb (86.6 kg)  09/06/21 184 lb (83.5 kg)     Kidney Function Lab Results  Component Value Date/Time   CREATININE 1.97 (H) 01/08/2022 10:43 AM   CREATININE 1.94 (H) 12/15/2021 08:45 AM   CREATININE 1.70 (H) 08/30/2021 04:02 AM   CREATININE 1.78 (H) 11/18/2020 09:56 AM   GFRNONAA 39 (L) 08/30/2021 04:02 AM   GFRNONAA 47 (L) 01/19/2020 04:21 PM   GFRAA 54 (L) 01/19/2020 04:21 PM       Latest Ref Rng & Units 01/08/2022   10:43 AM 12/15/2021    8:45 AM 08/30/2021    4:02 AM  BMP  Glucose 70 - 99 mg/dL 86  150  89   BUN 8 - 27 mg/dL 43  34  34   Creatinine 0.76 - 1.27 mg/dL 1.97  1.94  1.70   BUN/Creat Ratio 10 - '24 22  18    '$ Sodium 134 - 144 mmol/L 141  140  142   Potassium 3.5 - 5.2 mmol/L 4.5  5.1  4.1   Chloride 96 - 106 mmol/L 105  104  105   CO2 20 - 29 mmol/L '23  31  26   '$ Calcium 8.6 - 10.2 mg/dL 9.3  9.3  9.4     Current antihypertensive regimen:  Doxazosin 2 mg daily Furosemide 40 mg daily Lisinopril 10 mg daily  How often are you checking your Blood Pressure? several times per month  Current home BP readings: 145/70  What recent interventions/DTPs have been made by any provider to improve Blood Pressure control since last CPP Visit: No recent interventions or DTPs.  Any recent hospitalizations or ED visits since last visit with CPP? No  What diet changes have been made to improve Blood Pressure Control?  Patient states he has a "good diet."  What exercise is being done to improve your Blood Pressure Control?  Patient states he falls asleep a lot in the chair. He  does not exercise.  Adherence Review: Is the patient currently on ACE/ARB medication? Yes Does the patient have >5 day gap between last estimated fill dates? No  Care Gaps: Medicare Annual Wellness: Overdue since 04/07/2019 Foot Exam: Completed 01/11/2023 Hemoglobin A1C: 6.0% on 12/15/2021  Future Appointments  Date Time Provider [place]  01/16/2022 11:00 AM [name] Frizzle, MD [facility]  02/08/2022  2:00 PM [facility] PHARMACIST [facility]  04/17/2022 11:30 AM Rondel Jumbo, PA-C [facility] None  04/18/2022  8:45 AM [name], DPM [facility]   Star Rating Drugs: Atorvastatin 20 mg last filled 11/15/2021 90 DS Lisinopril 10 mg last filled 11/17/2021 90 DS  April D Calhoun, [place] Pharmacist Assistant [phone]

## 2022-01-16 ENCOUNTER — Telehealth (INDEPENDENT_AMBULATORY_CARE_PROVIDER_SITE_OTHER): Payer: PPO | Admitting: Family Medicine

## 2022-01-16 ENCOUNTER — Encounter: Payer: Self-pay | Admitting: Family Medicine

## 2022-01-16 DIAGNOSIS — I5030 Unspecified diastolic (congestive) heart failure: Secondary | ICD-10-CM | POA: Diagnosis not present

## 2022-01-16 DIAGNOSIS — R7989 Other specified abnormal findings of blood chemistry: Secondary | ICD-10-CM

## 2022-01-16 MED ORDER — FLUTICASONE FUROATE-VILANTEROL 200-25 MCG/ACT IN AEPB: INHALATION_SPRAY | RESPIRATORY_TRACT | 11 refills | Status: DC

## 2022-01-16 MED ORDER — EMPAGLIFLOZIN 10 MG PO TABS: 10.0000 mg | ORAL_TABLET | Freq: Every day | ORAL | 5 refills | Status: DC

## 2022-01-16 NOTE — Progress Notes (Signed)
Subjective:    Patient ID: [name], male    DOB: 1933-02-16, 88 y.o.   MRN: [id number]  HPI  Recently seen with DOE and peripheral edema.  Echo showed preserved EF despite elevated BNP suggesting CHF with preserved EF.  Does have moderate pericardial effusion without tamponade.  Started jardiance for that reason and using lasix every other day.  Scheduled video visit to ask questions regarding this diagnosis and ECHO results. Patient being seen as video visit.  Currently at home.  I am in my office. Visit began at 1113 and concluded at 1127.  Consents to be seen by video.  Past Medical History:  Diagnosis Date   Acute blood loss anemia    Acute upper respiratory infections of unspecified site 11/04/2012   AKI (acute kidney injury)    Benign prostatic hyperplasia    CAD (coronary artery disease)    s/p cypher DES to pLAD 6/08; normal LVF;  ETT-Myoview 2009: no ischemia    Clavicle fracture 08/10/2019   Closed displaced fracture of phalanx of left thumb, sequela 08/10/2019   Coronary atherosclerosis of native coronary artery 03/08/2008   Dizziness 03/08/2020   Eosinophilia    Essential hypertension    Fracture of multiple ribs with pain 08/10/2019   Hematoma 11/04/2012   History of multiple falls    History of SAH (subarachnoid hemorrhage) 07/14/2019   Hyperlipidemia type IIB / III 03/08/2008   Itching of ear 07/14/2021   Major neurocognitive disorder due to possible Alzheimer's disease, without behavioral disturbance 02/29/2020   MI (myocardial infarction)    Moderate persistent asthma    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   Imaging revealed [facility]   Multiple trauma    Nocturnal leg cramps 11/23/2012   Orthostatic hypotension 03/08/2008   Otalgia of left ear 05/11/2021   Otorrhagia of left ear 03/30/2021   Pain in joint, shoulder region 11/23/2012   Stage 3b chronic kidney disease    Thrombocytopenia    Trigeminal neuralgia    Type 2  diabetes mellitus    Past Surgical History:  Procedure Laterality Date   APPENDECTOMY     CARDIAC CATHETERIZATION  07/30/2006   CORONARY ANGIOPLASTY WITH STENT PLACEMENT   CARDIAC CATHETERIZATION     EXPLORATORY LAPAROTOMY     age 81   EXPLORATORY LAPAROTOMY     IR THORACENTESIS ASP PLEURAL SPACE W/IMG GUIDE  10/05/2019   Current Outpatient Medications on File Prior to Visit  Medication Sig Dispense Refill   albuterol (PROAIR HFA) 108 (90 Base) MCG/ACT inhaler INHALE 2 PUFFS INTO THE LUNGS EVERY 6 HOURS AS NEEDED FOR WHEEZING OR SHORTNESS OF BREATH 6.7 g 0   Apoaequorin (PREVAGEN EXTRA STRENGTH PO) Take 1 tablet by mouth daily.     atorvastatin (LIPITOR) 20 MG tablet Take 1 tablet (20 mg total) by mouth daily. 90 tablet 3   doxazosin (CARDURA) 2 MG tablet TAKE 1 TABLET BY MOUTH EVERY DAY 30 tablet 0   escitalopram (LEXAPRO) 10 MG tablet TAKE 1 TABLET BY MOUTH EVERY DAY 90 tablet 2   ezetimibe (ZETIA) 10 MG tablet TAKE 1 TABLET BY MOUTH DAILY. 90 tablet 3   fluticasone furoate-vilanterol (BREO ELLIPTA) 200-25 MCG/ACT AEPB INHALE 1 PUFF DAILY *NEED APPT* 60 each 0   furosemide (LASIX) 40 MG tablet Take 1 tablet (40 mg total) by mouth every other day. 45 tablet 3   lisinopril (ZESTRIL) 10 MG tablet Take 1 tablet (10 mg total) by mouth daily. [place]  tablet 0   loratadine (CLARITIN) 10 MG tablet Take 10 mg by mouth daily as needed for allergies or rhinitis.      meclizine (ANTIVERT) 12.5 MG tablet Take 1 tablet (12.5 mg total) by mouth 3 (three) times daily as needed for dizziness. 15 tablet 0   memantine (NAMENDA) 5 MG tablet TAKE 1 TABLET BY MOUTH EVERYDAY AT BEDTIME 90 tablet 0   Multiple Vitamin (MULTIVITAMIN WITH MINERALS) TABS tablet Take 1 tablet by mouth daily.     No current facility-administered medications on file prior to visit.   Allergies  Allergen Reactions   Gabapentin Other (See Comments)    Visual changes and confusion- "I went blind while I was driving"   Social History    Socioeconomic History   Marital status: Married    Spouse name: [name]   Number of children: 1   Years of education: 20   Highest education level: Occupational hygienist History   Occupation: retired professor    Comment: History    Occupation: missionary  Tobacco Use   Smoking status: Never   Smokeless tobacco: Never  Scientific laboratory technician Use: Never used  Substance and Sexual Activity   Alcohol use: Not Currently   Drug use: Never   Sexual activity: Yes    Partners: Female  Other Topics Concern   Not on file  Social History Narrative   ** Merged History Encounter **       Lives with his wife (second marriage in 2015, first marriage ended when his wife died alzheimer's disease). Since his remarriage, his adult daughter doesn't speak with him.      Right handed   Social Determinants of Health   Financial Resource Strain: Low Risk  (11/11/2020)   Overall Financial Resource Strain (CARDIA)    Difficulty of Paying Living Expenses: Not very hard  Food Insecurity: Not on file  Transportation Needs: Not on file  Physical Activity: Not on file  Stress: Not on file  Social Connections: Not on file  Intimate Partner Violence: Not on file   Family History  Problem Relation Age of Onset   Stomach cancer Mother    Memory loss Mother    Heart disease Father    Coronary artery disease Father 64   Heart attack Father    Heart disease Brother    Aortic aneurysm Brother    Colon cancer Brother    Aortic aneurysm Brother    Colon cancer Brother    Arthritis Daughter        rheumatoid   Coronary artery disease Brother    Esophageal cancer Neg Hx    Rectal cancer Neg Hx      Review of Systems  All other systems reviewed and are negative.      Objective:   NO exam       Assessment & Plan:  Elevated brain natriuretic peptide (BNP) level  Diastolic heart failure, unspecified HF chronicity (HCC) Begin jardiance 10 mg poqday and repeat bmp in 2 weeks.  Discussed  situation and rationale in detail with patient and spouse.

## 2022-01-17 ENCOUNTER — Other Ambulatory Visit: Payer: Self-pay | Admitting: Family Medicine

## 2022-01-17 ENCOUNTER — Telehealth: Payer: Self-pay

## 2022-01-17 NOTE — Telephone Encounter (Signed)
Pt's Covid test at home was negative. Thank you.

## 2022-01-17 NOTE — Telephone Encounter (Signed)
The echo looks pretty good. Heart function is strong and there is no severe valvular heart problem. I'd be ok with increasing lasix to 40 mg every day to try to get more fluid out. Should have follow-up labs (BMET) in 4 weeks. thanks

## 2022-01-18 DIAGNOSIS — N1832 Chronic kidney disease, stage 3b: Secondary | ICD-10-CM | POA: Diagnosis not present

## 2022-01-18 DIAGNOSIS — I517 Cardiomegaly: Secondary | ICD-10-CM | POA: Diagnosis not present

## 2022-01-18 DIAGNOSIS — E1122 Type 2 diabetes mellitus with diabetic chronic kidney disease: Secondary | ICD-10-CM | POA: Diagnosis not present

## 2022-01-18 DIAGNOSIS — Z6831 Body mass index (BMI) 31.0-31.9, adult: Secondary | ICD-10-CM | POA: Diagnosis not present

## 2022-01-18 DIAGNOSIS — I3139 Other pericardial effusion (noninflammatory): Secondary | ICD-10-CM | POA: Diagnosis not present

## 2022-01-18 DIAGNOSIS — I5032 Chronic diastolic (congestive) heart failure: Secondary | ICD-10-CM | POA: Diagnosis not present

## 2022-01-18 DIAGNOSIS — I714 Abdominal aortic aneurysm, without rupture, unspecified: Secondary | ICD-10-CM | POA: Diagnosis not present

## 2022-01-18 NOTE — Telephone Encounter (Signed)
Requested medication (s) are due for refill today:   Yes  Requested medication (s) are on the active medication list:   Yes  Future visit scheduled:   No   Had video visit with Dr. Dennard Schaumann on 12/22/2021   Last ordered: 12/22/2021 #30, 0 refills  Returned for provider review.   Not mentioned in notes if he needs another visit or if ok to refill this.  Also a 90 day supply is being requested by the pharmacy.      Requested Prescriptions  Pending Prescriptions Disp Refills   doxazosin (CARDURA) 2 MG tablet [Pharmacy Med Name: DOXAZOSIN MESYLATE 2 MG TAB] 90 tablet 1    Sig: TAKE 1 TABLET BY MOUTH EVERY DAY     Cardiovascular:  Alpha Blockers Failed - 01/17/2022  2:31 PM      Failed - Last BP in normal range    BP Readings from Last 1 Encounters:  01/08/22 (!) 140/70         Failed - Valid encounter within last 6 months    Recent Outpatient Visits           1 year ago Viral URI   [place] Dennard Schaumann, Cammie Mcgee, MD   1 year ago Hordeolum externum of left upper eyelid   [place] Eulogio Bear, NP   1 year ago Fever, unspecified fever cause   [place] Pickard, Cammie Mcgee, MD   1 year ago Sebaceous cyst   [place] [name], MD   1 year ago Itching of ear   [facility] Pickard, Cammie Mcgee, MD

## 2022-01-19 ENCOUNTER — Other Ambulatory Visit: Payer: Self-pay | Admitting: Family Medicine

## 2022-01-19 NOTE — Telephone Encounter (Signed)
Returned call (again) to [place] and spoke with [name] (on [facility]) to review increasing the lasix to qd and repeating BMET 02/15/22. She states that while they haven't been weighing him d/t not having a scale at the house, she feels that his swelling has improved since last week when she sent the initial message. She understands that they can increase to daily if needed. She is traveling this weekend and he will weigh himself when they get to their destination and she states they will plan to have him start weighing more regularly after they return home. Still denies any worsening SOB or issues. Will follow with labs here on the 11th and let us know if the meantime if they need [place]. States that PCP has started him on Jardiance '10mg'$  as well.

## 2022-01-19 NOTE — Telephone Encounter (Signed)
Requested Prescriptions  Pending Prescriptions Disp Refills   escitalopram (LEXAPRO) 10 MG tablet [Pharmacy Med Name: ESCITALOPRAM 10 MG TABLET] 90 tablet 1    Sig: TAKE 1 TABLET BY MOUTH EVERY DAY     Psychiatry:  Antidepressants - SSRI Failed - 01/19/2022  9:25 AM      Failed - Valid encounter within last 6 months    Recent Outpatient Visits           1 year ago Viral URI   [place] Dennard Schaumann, Cammie Mcgee, MD   1 year ago Hordeolum externum of left upper eyelid   [place] Eulogio Bear, NP   1 year ago Fever, unspecified fever cause   [place] Pickard, Cammie Mcgee, MD   1 year ago Sebaceous cyst   [place] [name], MD   1 year ago Itching of ear   [facility] Pickard, Cammie Mcgee, MD

## 2022-02-02 ENCOUNTER — Telehealth: Payer: Self-pay | Admitting: Cardiology

## 2022-02-02 ENCOUNTER — Ambulatory Visit (INDEPENDENT_AMBULATORY_CARE_PROVIDER_SITE_OTHER): Payer: PPO | Admitting: Family Medicine

## 2022-02-02 ENCOUNTER — Encounter: Payer: Self-pay | Admitting: Family Medicine

## 2022-02-02 VITALS — BP 118/62 | HR 83 | Temp 98.4°F | Ht 67.0 in | Wt 196.6 lb

## 2022-02-02 DIAGNOSIS — J019 Acute sinusitis, unspecified: Secondary | ICD-10-CM | POA: Diagnosis not present

## 2022-02-02 MED ORDER — AMOXICILLIN-POT CLAVULANATE 875-125 MG PO TABS: 1.0000 | ORAL_TABLET | Freq: Two times a day (BID) | ORAL | 0 refills | Status: DC

## 2022-02-02 NOTE — Telephone Encounter (Signed)
Patient's wife state the patient had a kidney fuction panel today with Dr.Pickard's office. She would like to know if the patient needs to have any additional panels drawn. Please advise.

## 2022-02-02 NOTE — Progress Notes (Signed)
Subjective:    Patient ID: [name], male    DOB: [birth year], 88 y.o.   MRN: [id number]  Cough  Patient presents with 2 weeks of head congestion rhinorrhea, and a rattling in his chest with coughing.  Denies fever, chills, chest pain, shortness of breath.  Is having some sinus pressure and sinus congestion. Past Medical History:  Diagnosis Date   Acute blood loss anemia    Acute upper respiratory infections of unspecified site 11/04/2012   AKI (acute kidney injury)    Benign prostatic hyperplasia    CAD (coronary artery disease)    s/p cypher DES to pLAD 6/08; normal LVF;  ETT-Myoview 2009: no ischemia    Clavicle fracture 08/10/2019   Closed displaced fracture of phalanx of left thumb, sequela 08/10/2019   Coronary atherosclerosis of native coronary artery 03/08/2008   Dizziness 03/08/2020   Eosinophilia    Essential hypertension    Fracture of multiple ribs with pain 08/10/2019   Hematoma 11/04/2012   History of multiple falls    History of SAH (subarachnoid hemorrhage) 07/14/2019   Hyperlipidemia type IIB / III 03/08/2008   Itching of ear 07/14/2021   Major neurocognitive disorder due to possible Alzheimer's disease, without behavioral disturbance 02/29/2020   MI (myocardial infarction)    Moderate persistent asthma    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   Imaging revealed [facility]   Multiple trauma    Nocturnal leg cramps 11/23/2012   Orthostatic hypotension 03/08/2008   Otalgia of left ear 05/11/2021   Otorrhagia of left ear 03/30/2021   Pain in joint, shoulder region 11/23/2012   Stage 3b chronic kidney disease    Thrombocytopenia    Trigeminal neuralgia    Type 2 diabetes mellitus    Past Surgical History:  Procedure Laterality Date   APPENDECTOMY     CARDIAC CATHETERIZATION  07/30/2006   CORONARY ANGIOPLASTY WITH STENT PLACEMENT   CARDIAC CATHETERIZATION     EXPLORATORY LAPAROTOMY     age 22   EXPLORATORY LAPAROTOMY     IR  THORACENTESIS ASP PLEURAL SPACE W/IMG GUIDE  10/05/2019   Current Outpatient Medications on File Prior to Visit  Medication Sig Dispense Refill   albuterol (PROAIR HFA) 108 (90 Base) MCG/ACT inhaler INHALE 2 PUFFS INTO THE LUNGS EVERY 6 HOURS AS NEEDED FOR WHEEZING OR SHORTNESS OF BREATH 6.7 g 0   Apoaequorin (PREVAGEN EXTRA STRENGTH PO) Take 1 tablet by mouth daily.     atorvastatin (LIPITOR) 20 MG tablet Take 1 tablet (20 mg total) by mouth daily. 90 tablet 3   doxazosin (CARDURA) 2 MG tablet TAKE 1 TABLET BY MOUTH EVERY DAY 90 tablet 1   empagliflozin (JARDIANCE) 10 MG TABS tablet Take 1 tablet (10 mg total) by mouth daily before breakfast. 30 tablet 5   escitalopram (LEXAPRO) 10 MG tablet TAKE 1 TABLET BY MOUTH EVERY DAY 90 tablet 1   ezetimibe (ZETIA) 10 MG tablet TAKE 1 TABLET BY MOUTH DAILY. 90 tablet 3   fluticasone furoate-vilanterol (BREO ELLIPTA) 200-25 MCG/ACT AEPB INHALE 1 PUFF DAILY *NEED APPT* 60 each 11   furosemide (LASIX) 40 MG tablet Take 1 tablet (40 mg total) by mouth every other day. 45 tablet 3   lisinopril (ZESTRIL) 10 MG tablet Take 1 tablet (10 mg total) by mouth daily. 90 tablet 0   loratadine (CLARITIN) 10 MG tablet Take 10 mg by mouth daily as needed for allergies or rhinitis.      meclizine (ANTIVERT)  12.5 MG tablet Take 1 tablet (12.5 mg total) by mouth 3 (three) times daily as needed for dizziness. 15 tablet 0   memantine (NAMENDA) 5 MG tablet TAKE 1 TABLET BY MOUTH EVERYDAY AT BEDTIME 90 tablet 0   Multiple Vitamin (MULTIVITAMIN WITH MINERALS) TABS tablet Take 1 tablet by mouth daily.     No current facility-administered medications on file prior to visit.   Allergies  Allergen Reactions   Gabapentin Other (See Comments)    Visual changes and confusion- "I went blind while I was driving"   Social History   Socioeconomic History   Marital status: Married    Spouse name: [name]   Number of children: 1   Years of education: 20   Highest education level:  Occupational hygienist History   Occupation: retired professor    Comment: History    Occupation: missionary  Tobacco Use   Smoking status: Never   Smokeless tobacco: Never  Scientific laboratory technician Use: Never used  Substance and Sexual Activity   Alcohol use: Not Currently   Drug use: Never   Sexual activity: Yes    Partners: Female  Other Topics Concern   Not on file  Social History Narrative   ** Merged History Encounter **       Lives with his wife (second marriage in 2015, first marriage ended when his wife died alzheimer's disease). Since his remarriage, his adult daughter doesn't speak with him.      Right handed   Social Determinants of Health   Financial Resource Strain: Low Risk  (11/11/2020)   Overall Financial Resource Strain (CARDIA)    Difficulty of Paying Living Expenses: Not very hard  Food Insecurity: Not on file  Transportation Needs: Not on file  Physical Activity: Not on file  Stress: Not on file  Social Connections: Not on file  Intimate Partner Violence: Not on file   Family History  Problem Relation Age of Onset   Stomach cancer Mother    Memory loss Mother    Heart disease Father    Coronary artery disease Father 25   Heart attack Father    Heart disease Brother    Aortic aneurysm Brother    Colon cancer Brother    Aortic aneurysm Brother    Colon cancer Brother    Arthritis Daughter        rheumatoid   Coronary artery disease Brother    Esophageal cancer Neg Hx    Rectal cancer Neg Hx      Review of Systems  Respiratory:  Positive for cough.   All other systems reviewed and are negative.      Objective:   Physical Exam Vitals reviewed.  Constitutional:      General: He is not in acute distress.    Appearance: He is well-developed. He is not diaphoretic.  HENT:     Right Ear: Tympanic membrane and ear canal normal.     Left Ear: Tympanic membrane and ear canal normal.     Nose: Congestion and rhinorrhea present.      Mouth/Throat:     Pharynx: No oropharyngeal exudate or posterior oropharyngeal erythema.  Neck:     Thyroid: No thyromegaly.     Vascular: No carotid bruit or JVD.     Trachea: No tracheal deviation.  Cardiovascular:     Rate and Rhythm: Normal rate and regular rhythm.     Heart sounds: Normal heart sounds. No murmur heard.    No  friction rub. No gallop.  Pulmonary:     Effort: Pulmonary effort is normal. No accessory muscle usage, prolonged expiration or respiratory distress.     Breath sounds: No stridor. No wheezing or rales.  Musculoskeletal:     Cervical back: Neck supple. No erythema, rigidity, torticollis or crepitus. No pain with movement, spinous process tenderness or muscular tenderness. Normal range of motion.     Right lower leg: No edema.     Left lower leg: No edema.  Lymphadenopathy:     Cervical: No cervical adenopathy.  Neurological:     Mental Status: He is alert.     Motor: No abnormal muscle tone.     Deep Tendon Reflexes: Reflexes are normal and symmetric.           Assessment & Plan:  Acute non-recurrent sinusitis, unspecified location - Plan: BASIC METABOLIC PANEL WITH GFR I believe the patient has a viral upper respiratory infection burgeoning on sinusitis.  Recommended tincture of time and Coricidin HBP.  If symptoms worsen and he develops sinus pain or fevers he can start Augmentin but at the present time he does not require the antibiotic.  Check a BMP to monitor his renal function on Jardiance.  The edema in his leg has improved

## 2022-02-02 NOTE — Telephone Encounter (Signed)
Called spouse advised that labs are scheduled for 02/08/22.  Per primary RN note pt is to have a BMP on 02/15/22: Returned call (again) to [place] and spoke with [name] (on [facility]) to review increasing the lasix to qd and repeating BMET 02/15/22.   Lab appointment rescheduled to 02/15/22. No further concerns at this time.

## 2022-02-03 LAB — BASIC METABOLIC PANEL WITH GFR
BUN/Creatinine Ratio: 18 (calc) (ref 6–22)
BUN: 41 mg/dL — ABNORMAL HIGH (ref 7–25)
CO2: 27 mmol/L (ref 20–32)
Calcium: 9 mg/dL (ref 8.6–10.3)
Chloride: 103 mmol/L (ref 98–110)
Creat: 2.22 mg/dL — ABNORMAL HIGH (ref 0.70–1.22)
Glucose, Bld: 173 mg/dL — ABNORMAL HIGH (ref 65–99)
Potassium: 5 mmol/L (ref 3.5–5.3)
Sodium: 138 mmol/L (ref 135–146)
eGFR: 28 mL/min/{1.73_m2} — ABNORMAL LOW (ref >=60)

## 2022-02-07 NOTE — Progress Notes (Signed)
Care Management & Coordination Services Pharmacy Note  02/07/2022 Name:  [name] MRN:  [id number] DOB:  Mar 04, 1933  Summary: PharmD FU visit.  Patient now taking Lasix prn and Jardiance.  Reports his swelling has improved and he can now see his ankles.  BP and glucose are controlled.  Recommendations/Changes made from today's visit: No changes, sent PAP for Jardiance Please monitor s/sx of HF exacerbation  Follow up plan: FU with me in 6 months   Subjective: [name] is an 89 y.o. year old male who is a primary patient of [name], Cammie Mcgee, MD.  The care coordination team was consulted for assistance with disease management and care coordination needs.    Engaged with patient by telephone for follow up visit.  Patient Care Team: [name], MD as PCP - General (Family Medicine) Sherren Mocha, MD as PCP - Cardiology (Cardiology) Audery Amel Sharma Covert, [facility] (Inactive) as Pharmacist (Pharmacist) [name], MD (Family Medicine) Cameron Sprang, MD as Consulting Physician (Neurology) Edythe Clarity, [facility] as Pharmacist (Pharmacist)  Recent office visits:  12/15/2021 OV (PCP) [name], MD; At that point I would discontinue amlodipine and lisinopril and replace with Entresto and order an echocardiogram. If his BNP is elevated, consider Jardiance.    Recent consult visits:  01/08/2022 OV (Cardiology) Jerline Pain, MD; no medication changes indicated.   01/08/2022 OV (Podiatry) Marzetta Board, DPM; no medication changes indicated.   Hospital visits:  None since last adherence call    Objective:  Lab Results  Component Value Date   CREATININE 2.22 (H) 02/02/2022   BUN 41 (H) 02/02/2022   EGFR 28 (L) 02/02/2022   GFRNONAA 39 (L) 08/30/2021   GFRAA 54 (L) 01/19/2020   NA 138 02/02/2022   K 5.0 02/02/2022   CALCIUM 9.0 02/02/2022   CO2 27 02/02/2022   GLUCOSE 173 (H) 02/02/2022    Lab Results  Component Value Date/Time    HGBA1C 6.0 (H) 12/15/2021 08:45 AM   HGBA1C 5.5 11/18/2020 09:56 AM   MICROALBUR 86.7 11/18/2020 09:56 AM   MICROALBUR 27.1 04/07/2018 08:27 AM    Last diabetic Eye exam: No results found for: "HMDIABEYEEXA"  Last diabetic Foot exam: No results found for: "HMDIABFOOTEX"   Lab Results  Component Value Date   CHOL 134 12/15/2021   HDL 60 12/15/2021   LDLCALC 52 12/15/2021   TRIG 135 12/15/2021   CHOLHDL 2.2 12/15/2021       Latest Ref Rng & Units 12/15/2021    8:45 AM 08/30/2021    4:02 AM 08/27/2021    6:16 AM  Hepatic Function  Total Protein 6.1 - 8.1 g/dL 6.3  6.2  6.3   Albumin 3.5 - 5.0 g/dL  3.9  4.0   AST 10 - 35 U/L 33  32  29   ALT 9 - 46 U/L _0 Alk Phosphatase 38 - 126 U/L  82  85   Total Bilirubin 0.2 - 1.2 mg/dL 0.5  0.5  0.4     Lab Results  Component Value Date/Time   TSH 0.94 05/06/2017 02:45 PM   TSH 1.84 03/29/2016 08:05 AM       Latest Ref Rng & Units 12/15/2021    8:45 AM 08/30/2021    4:02 AM 08/27/2021    6:16 AM  CBC  WBC 3.8 - 10.8 Thousand/uL 4.8  6.5  9.9   Hemoglobin 13.2 - 17.1 g/dL 10.8  10.7  10.6   Hematocrit 38.5 - 50.0 % 31.7  32.0  32.5   Platelets 140 - 400 Thousand/uL 183  172  159     No results found for: "VD25OH", "VITAMINB12"  Clinical ASCVD: No  The ASCVD Risk score (Arnett DK, et al., 2019) failed to calculate for the following reasons:   The 2019 ASCVD risk score is only valid for ages 59 to 29   The patient has a prior MI or stroke diagnosis       12/15/2021    8:47 AM 03/10/2020    3:26 PM 11/25/2019    3:06 PM  Depression screen PHQ 2/9  Decreased Interest 0 0 0  Down, Depressed, Hopeless 0 0 0  PHQ - 2 Score 0 0 0  Altered sleeping 0    Tired, decreased energy 0    Change in appetite 0    Feeling bad or failure about yourself  0    Trouble concentrating 0    Moving slowly or fidgety/restless 0    Suicidal thoughts 0    PHQ-9 Score 0    Difficult doing work/chores Not difficult at all        Social History   Tobacco Use  Smoking Status Never  Smokeless Tobacco Never   BP Readings from Last 3 Encounters:  02/02/22 118/62  01/08/22 (!) 140/70  01/08/22 (!) 147/64   Pulse Readings from Last 3 Encounters:  02/02/22 83  01/08/22 69  12/15/21 79   Wt Readings from Last 3 Encounters:  02/02/22 196 lb 9.6 oz (89.2 kg)  01/08/22 196 lb (88.9 kg)  12/15/21 191 lb (86.6 kg)   BMI Readings from Last 3 Encounters:  02/02/22 30.79 kg/m  01/08/22 30.70 kg/m  12/15/21 29.91 kg/m    Allergies  Allergen Reactions   Gabapentin Other (See Comments)    Visual changes and confusion- "I went blind while I was driving"    Medications Reviewed Today     Reviewed by Chriss Driver, LPN (Licensed Practical Nurse) on 02/02/22 at [place] List Status: <None>   Medication Order Taking? Sig Documenting Provider Last Dose Status Informant  albuterol (PROAIR HFA) 108 (90 Base) MCG/ACT inhaler [id number] Yes INHALE 2 PUFFS INTO THE LUNGS EVERY 6 HOURS AS NEEDED FOR WHEEZING OR SHORTNESS OF BREATH Angiulli, Lavon Paganini, PA-C Taking Active   atorvastatin (LIPITOR) 20 MG tablet [id number] Yes Take 1 tablet (20 mg total) by mouth daily. [name], MD Taking Active   doxazosin (CARDURA) 2 MG tablet [id number] Yes TAKE 1 TABLET BY MOUTH EVERY DAY [name], MD Taking Active   empagliflozin (JARDIANCE) 10 MG TABS tablet [id number] Yes Take 1 tablet (10 mg total) by mouth daily before breakfast. [name], MD Taking Active   escitalopram (LEXAPRO) 10 MG tablet [id number] Yes TAKE 1 TABLET BY MOUTH EVERY DAY [name], MD Taking Active   ezetimibe (ZETIA) 10 MG tablet [id number] Yes TAKE 1 TABLET BY MOUTH DAILY. Sherren Mocha, MD Taking Active   fluticasone furoate-vilanterol (BREO ELLIPTA) 200-25 MCG/ACT AEPB [id number] Yes INHALE 1 PUFF DAILY *NEED APPT* [name], MD Taking Active   furosemide (LASIX) 40 MG tablet [id number] Yes Take 1 tablet (40 mg  total) by mouth every other day. Sherren Mocha, MD Taking Active   lisinopril (ZESTRIL) 10 MG tablet [id number] Yes Take 1 tablet (10 mg total) by mouth daily. [name], MD Taking Active   loratadine (CLARITIN) 10 MG  tablet [id number] Yes Take 10 mg by mouth daily as needed for allergies or rhinitis.  [provider] Taking Active Spouse/Significant Other  meclizine (ANTIVERT) 12.5 MG tablet [id number] Yes Take 1 tablet (12.5 mg total) by mouth 3 (three) times daily as needed for dizziness. Lajean Saver, MD Taking Active   memantine [facility]) 5 MG tablet [id number] Yes TAKE 1 TABLET BY MOUTH EVERYDAY AT BEDTIME Cameron Sprang, MD Taking Active   Multiple Vitamin (MULTIVITAMIN WITH MINERALS) TABS tablet [id number] Yes Take 1 tablet by mouth daily. [provider] Taking Active Spouse/Significant Other            Patient Active Problem List   Diagnosis Date Noted   Itching of ear 07/14/2021   Otalgia of left ear 05/11/2021   Otorrhagia of left ear 03/30/2021   Dizziness 03/08/2020   Major neurocognitive disorder due to possible Alzheimer's disease, without behavioral disturbance 02/29/2020   CAD (coronary artery disease)    MI (myocardial infarction)    Trigeminal neuralgia    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    Multiple trauma    Benign prostatic hyperplasia    Essential hypertension    Stage 3b chronic kidney disease (HCC)    Thrombocytopenia    History of multiple falls 07/14/2019   History of SAH (subarachnoid hemorrhage) 07/14/2019   Type 2 diabetes mellitus    Eosinophilia    Nocturnal leg cramps 11/23/2012   Hematoma 11/04/2012   Hyperlipidemia type IIB / III 03/08/2008   Orthostatic hypotension 03/08/2008   Coronary atherosclerosis of native coronary artery 03/08/2008    Immunization History  Administered Date(s) Administered   Fluad Quad(high Dose 65+) 11/10/2020, 12/15/2021   Influenza Split 10/06/2012    Influenza, High Dose Seasonal PF 12/02/2016, 10/26/2018, 10/27/2018, 10/23/2019   Influenza,inj,Quad PF,6+ Mos 11/12/2013, 11/11/2014, 10/20/2015, 11/12/2017   Moderna Sars-Covid-2 Vaccination 03/04/2019, 04/03/2019   Pfizer Covid-19 Vaccine Bivalent Booster 17yr & up 11/10/2020   Pneumococcal Conjugate-13 03/17/2013   Pneumococcal Polysaccharide-23 02/05/2001   Td 03/01/2009   Zoster, Live 02/25/2008     Compliance/Adherence/Medication fill history: Care Gaps: Due for Medicare AWV  Star-Rating Drugs: Atorvastatin 11/15/21 90ds Jardiance 140m12/12/23 30ds Lisinopril 1032m0/13/23/90ds  SDOH:  (Social Determinants of Health) assessments and interventions performed: Yes  Financial Resource Strain: Low Risk  (11/11/2020)   Overall Financial Resource Strain (CARDIA)    Difficulty of Paying Living Expenses: Not very hard   Food Insecurity: Not on file    SDOH Screenings   Alcohol Screen: Low Risk  (04/07/2018)  Depression (PHQ2-9): Low Risk  (12/15/2021)  Financial Resource Strain: Low Risk  (11/11/2020)  Tobacco Use: Low Risk  (02/02/2022)    Medication Assistance: Application for Jardiance  medication assistance program. in process.  Anticipated assistance start date none.  See plan of care for additional detail.  Medication Access: Within the past 30 days, how often has patient missed a dose of medication? none Is a pillbox or other method used to improve adherence? Yes  Factors that may affect medication adherence? financial need Are meds synced by current pharmacy? No  Are meds delivered by current pharmacy? No  Does patient experience delays in picking up medications due to transportation concerns? No   [facility] Services Reviewed: Is patient disadvantaged to use [facility]?: Yes  Current Rx insurance plan: HTA [facility] and [id number] [address] Phone: [phone] Fax: [phone]  [facility] services reviewed with patient today?: Yes  Patient requests to transfer care to [facility]?: No  Reason patient declined to change pharmacies: Disadvantaged due to insurance/mail order   Assessment/Plan    Hypertension (BP goal <140/90) 08/03/21 -Controlled, not assessed -Current treatment: Amlodipine 85m daily  Appropriate, Effective, Safe, Accessible Doxazosin 285mdaily Appropriate, Effective, Safe, Accessible Lisinopril 1023maily Appropriate, Effective, Safe, Accessible -Medications previously tried: benazepril  -Current home readings: a few readings > 140/90 the majority were less. -Current exercise habits: some walking and weigh bearing exercise -Educated on BP goals and benefits of medications for prevention of heart attack, stroke and kidney damage; Exercise goal of 150 minutes per week; Symptoms of hypotension and importance of maintaining adequate hydration; -Counseled to monitor BP at home periodically, document, and provide log at future appointments -Wife brought in BP logs, however, the night time readings they were checking while he was laying down. Counseled on appropriate methods for checking blood pressure.  They are going to start checking twice daily and recording.  He has reported a few dizzy spells but this has been when he gets up from laying down and immediately starts walking.  Recommended he slow down before starting to walk. -Recommended to continue current medication CMA to check in about 30 days on BP  11/10/20 -Reports hypotensive/hypertensive symptoms - dizziness, however he is being treated for Vertigo at the moment doing PT for Epley maneuver and it has been beneficial so far -Recommended to continue current medication  Neurocognitive disorder (Goal: Prevent progression) 08/03/21 -Controlled, not assessed -Current treatment  Memantine 5mg76mce daily  Appropriate, Effective, Safe, Accessible -Medications previously tried: donepezil -Denies any GI effects with this medication, concerned it was increasing BP.  I am ok with current BP, however I do want to check after they start monitoring while he is sitting up.  They may slowly taper up as he has been sensitive to dose increases in other classes of these medications.  -Recommended to continue current medication Will call in 30 days to assess BP, no changes needed at this time.  Hyperlipidemia/CAD: (LDL goal < 100) -Not ideally controlled, not assessed at this visit -Current treatment: Atorvastatin 20mg34mly PM Zetia 10mg 59my -Medications previously tried: simvastatin  -Educated on Cholesterol goals;  Benefits of statin for ASCVD risk reduction; Importance of limiting foods high in cholesterol; -Due for recheck on lipids, coming in next week to check LFTs due to therapy with terbinafine for toenail fungus.  Patient would like to add follow up lab work since it has been < 1 year from previous full workup. -Recommended to continue current medication Recheck lipids - determine next steps  Heart Failure (Goal: manage symptoms and prevent exacerbations) 02/08/21 -Controlled -HF type: HFpEF (EF > 50%) -Current treatment: Jardiance 10mg A51mpriate, Query effective, ,  Furosemide 40mg pr14mr swelling Appropriate, Query effective, ,  -Medications previously tried: none noted  -Current home BP/HR readings: controlled lately -Current home daily weights: he is now monitoring weights, recommended he do this and monitor ankle swelling to determine when he takes Lasix -Educated on Benefits of medications for managing symptoms and prolonging life Importance of weighing daily; if you gain more than 3 pounds in one day or 5 pounds in one week, contact providers -Recommended to continue current medication Remain on prn lasix due to kidneys and dehydration.  Monitor for s/sx of HF exacerbation -  including swelling, weight gain, shortness of breath.  Diabetes (A1c goal <6.5%) 02/08/21 -Controlled, recent A1c is 6.0 -Current medications: Jardiance 71m Appropriate, Query effective, ,  -Medications previously tried: none noted  -Current home glucose readings fasting glucose: not checking post prandial glucose: not checking -Denies hypoglycemic/hyperglycemic symptoms -Most recent A1c was 6..Marland Kitchen - he started on Jardiance about 1 month ago due to elevated BNP and HFpEF.  Has noted that swelling has gone down [place]  He is tolerating well. Has not rechecked A1c since starting.   Continue meds for now - did mention copay burden with Jardiance.  PAP mailed today with instructions what to do when he receives.  CBeverly Milch PharmD, CPP Clinical Pharmacist Practitioner BHenriette([phone]

## 2022-02-08 ENCOUNTER — Ambulatory Visit: Payer: PPO | Admitting: Pharmacist

## 2022-02-08 ENCOUNTER — Other Ambulatory Visit: Payer: PPO

## 2022-02-13 DIAGNOSIS — H25813 Combined forms of age-related cataract, bilateral: Secondary | ICD-10-CM | POA: Diagnosis not present

## 2022-02-13 LAB — HM DIABETES EYE EXAM

## 2022-02-15 ENCOUNTER — Other Ambulatory Visit: Payer: PPO

## 2022-02-15 ENCOUNTER — Other Ambulatory Visit: Payer: Self-pay | Admitting: Family Medicine

## 2022-02-15 NOTE — Telephone Encounter (Signed)
Requested Prescriptions  Pending Prescriptions Disp Refills   lisinopril (ZESTRIL) 10 MG tablet [Pharmacy Med Name: LISINOPRIL 10 MG TABLET] 90 tablet 0    Sig: TAKE 1 TABLET BY MOUTH EVERY DAY     Cardiovascular:  ACE Inhibitors Failed - 02/15/2022  1:25 AM      Failed - Cr in normal range and within 180 days    Creat  Date Value Ref Range Status  02/02/2022 2.22 (H) 0.70 - 1.22 mg/dL Final   Creatinine, Urine  Date Value Ref Range Status  11/18/2020 106 20 - 320 mg/dL Final         Failed - Valid encounter within last 6 months    Recent Outpatient Visits           1 year ago Viral URI   [place] [name], MD   1 year ago Hordeolum externum of left upper eyelid   [place] Eulogio Bear, NP   1 year ago Fever, unspecified fever cause   [place] Pickard, Cammie Mcgee, MD   1 year ago Sebaceous cyst   [place] [name], MD   1 year ago Itching of ear   [place], Cammie Mcgee, MD       Future Appointments             In 3 months Sherren Mocha, MD [facility] [place]            Passed - K in normal range and within 180 days    Potassium  Date Value Ref Range Status  02/02/2022 5.0 3.5 - 5.3 mmol/L Final         Passed - Patient is not pregnant      Passed - Last BP in normal range    BP Readings from Last 1 Encounters:  02/02/22 118/62

## 2022-02-20 ENCOUNTER — Encounter: Payer: Self-pay | Admitting: Cardiovascular Disease

## 2022-02-21 ENCOUNTER — Ambulatory Visit: Payer: PPO | Attending: Cardiovascular Disease

## 2022-02-21 DIAGNOSIS — N183 Chronic kidney disease, stage 3 unspecified: Secondary | ICD-10-CM | POA: Diagnosis not present

## 2022-02-21 DIAGNOSIS — I2510 Atherosclerotic heart disease of native coronary artery without angina pectoris: Secondary | ICD-10-CM | POA: Diagnosis not present

## 2022-02-22 LAB — BASIC METABOLIC PANEL
BUN/Creatinine Ratio: 19 (ref 10–24)
BUN: 36 mg/dL — ABNORMAL HIGH (ref 8–27)
CO2: 23 mmol/L (ref 20–29)
Calcium: 8.7 mg/dL (ref 8.6–10.2)
Chloride: 104 mmol/L (ref 96–106)
Creatinine, Ser: 1.94 mg/dL — ABNORMAL HIGH (ref 0.76–1.27)
Glucose: 114 mg/dL — ABNORMAL HIGH (ref 70–99)
Potassium: 4.9 mmol/L (ref 3.5–5.2)
Sodium: 141 mmol/L (ref 134–144)
eGFR: 33 mL/min/{1.73_m2} — ABNORMAL LOW (ref >59)

## 2022-02-25 ENCOUNTER — Encounter: Payer: Self-pay | Admitting: Family Medicine

## 2022-02-25 ENCOUNTER — Encounter: Payer: Self-pay | Admitting: Cardiovascular Disease

## 2022-03-01 DIAGNOSIS — H25811 Combined forms of age-related cataract, right eye: Secondary | ICD-10-CM | POA: Diagnosis not present

## 2022-03-08 ENCOUNTER — Encounter: Payer: Self-pay | Admitting: Physician Assistant

## 2022-03-08 HISTORY — PX: CATARACT EXTRACTION: SUR2

## 2022-03-11 ENCOUNTER — Other Ambulatory Visit: Payer: Self-pay | Admitting: Neurology

## 2022-03-11 DIAGNOSIS — F0390 Unspecified dementia without behavioral disturbance: Secondary | ICD-10-CM

## 2022-03-12 ENCOUNTER — Encounter: Payer: Self-pay | Admitting: Cardiovascular Disease

## 2022-03-13 ENCOUNTER — Emergency Department (HOSPITAL_BASED_OUTPATIENT_CLINIC_OR_DEPARTMENT_OTHER): Payer: PPO

## 2022-03-13 ENCOUNTER — Other Ambulatory Visit: Payer: Self-pay

## 2022-03-13 ENCOUNTER — Emergency Department (HOSPITAL_BASED_OUTPATIENT_CLINIC_OR_DEPARTMENT_OTHER): Payer: PPO | Admitting: Radiology

## 2022-03-13 ENCOUNTER — Encounter (HOSPITAL_BASED_OUTPATIENT_CLINIC_OR_DEPARTMENT_OTHER): Payer: Self-pay

## 2022-03-13 ENCOUNTER — Emergency Department (HOSPITAL_BASED_OUTPATIENT_CLINIC_OR_DEPARTMENT_OTHER)
Admission: EM | Admit: 2022-03-13 | Discharge: 2022-03-13 | Disposition: A | Discharge status: Home / Self Care | Payer: PPO | Attending: Emergency Medicine | Admitting: Emergency Medicine

## 2022-03-13 DIAGNOSIS — R531 Weakness: Secondary | ICD-10-CM | POA: Diagnosis not present

## 2022-03-13 DIAGNOSIS — Z20822 Contact with and (suspected) exposure to covid-19: Secondary | ICD-10-CM | POA: Diagnosis not present

## 2022-03-13 DIAGNOSIS — F0280 Dementia in other diseases classified elsewhere without behavioral disturbance: Secondary | ICD-10-CM | POA: Diagnosis not present

## 2022-03-13 DIAGNOSIS — R0609 Other forms of dyspnea: Secondary | ICD-10-CM | POA: Insufficient documentation

## 2022-03-13 DIAGNOSIS — I2510 Atherosclerotic heart disease of native coronary artery without angina pectoris: Secondary | ICD-10-CM | POA: Diagnosis not present

## 2022-03-13 DIAGNOSIS — R42 Dizziness and giddiness: Secondary | ICD-10-CM | POA: Insufficient documentation

## 2022-03-13 DIAGNOSIS — R0602 Shortness of breath: Secondary | ICD-10-CM | POA: Diagnosis not present

## 2022-03-13 DIAGNOSIS — F0390 Unspecified dementia without behavioral disturbance: Secondary | ICD-10-CM | POA: Diagnosis not present

## 2022-03-13 DIAGNOSIS — G309 Alzheimer's disease, unspecified: Secondary | ICD-10-CM | POA: Diagnosis not present

## 2022-03-13 LAB — BASIC METABOLIC PANEL
Anion gap: 9 (ref 5–15)
BUN: 37 mg/dL — ABNORMAL HIGH (ref 8–23)
CO2: 24 mmol/L (ref 22–32)
Calcium: 9.4 mg/dL (ref 8.9–10.3)
Chloride: 106 mmol/L (ref 98–111)
Creatinine, Ser: 1.97 mg/dL — ABNORMAL HIGH (ref 0.61–1.24)
GFR, Estimated: 32 mL/min — ABNORMAL LOW (ref >60)
Glucose, Bld: 93 mg/dL (ref 70–99)
Potassium: 4.5 mmol/L (ref 3.5–5.1)
Sodium: 139 mmol/L (ref 135–145)

## 2022-03-13 LAB — RESP PANEL BY RT-PCR (RSV, FLU A&B, COVID)  RVPGX2
Influenza A by PCR: NEGATIVE
Influenza B by PCR: NEGATIVE
Resp Syncytial Virus by PCR: NEGATIVE
SARS Coronavirus 2 by RT PCR: NEGATIVE

## 2022-03-13 LAB — CBC WITH DIFFERENTIAL/PLATELET
Abs Immature Granulocytes: 0.01 10*3/uL (ref 0.00–0.07)
Basophils Absolute: 0 10*3/uL (ref 0.0–0.1)
Basophils Relative: 0 %
Eosinophils Absolute: 0.3 10*3/uL (ref 0.0–0.5)
Eosinophils Relative: 7 %
HCT: 34.4 % — ABNORMAL LOW (ref 39.0–52.0)
Hemoglobin: 11.5 g/dL — ABNORMAL LOW (ref 13.0–17.0)
Immature Granulocytes: 0 %
Lymphocytes Relative: 20 %
Lymphs Abs: 0.9 10*3/uL (ref 0.7–4.0)
MCH: 31.8 pg (ref 26.0–34.0)
MCHC: 33.4 g/dL (ref 30.0–36.0)
MCV: 95 fL (ref 80.0–100.0)
Monocytes Absolute: 0.5 10*3/uL (ref 0.1–1.0)
Monocytes Relative: 10 %
Neutro Abs: 3 10*3/uL (ref 1.7–7.7)
Neutrophils Relative %: 63 %
Platelets: 194 10*3/uL (ref 150–400)
RBC: 3.62 MIL/uL — ABNORMAL LOW (ref 4.22–5.81)
RDW: 13.2 % (ref 11.5–15.5)
WBC: 4.7 10*3/uL (ref 4.0–10.5)
nRBC: 0 % (ref 0.0–0.2)

## 2022-03-13 LAB — URINALYSIS, ROUTINE W REFLEX MICROSCOPIC
Bacteria, UA: NONE SEEN
Bilirubin Urine: NEGATIVE
Glucose, UA: 500 mg/dL — AB
Ketones, ur: NEGATIVE mg/dL
Leukocytes,Ua: NEGATIVE
Nitrite: NEGATIVE
Specific Gravity, Urine: 1.01 (ref 1.005–1.030)
pH: 5.5 (ref 5.0–8.0)

## 2022-03-13 LAB — TROPONIN I (HIGH SENSITIVITY)
Troponin I (High Sensitivity): 28 ng/L — ABNORMAL HIGH (ref <18)
Troponin I (High Sensitivity): 27 ng/L — ABNORMAL HIGH (ref <18)

## 2022-03-13 LAB — BRAIN NATRIURETIC PEPTIDE: B Natriuretic Peptide: 259.7 pg/mL — ABNORMAL HIGH (ref 0.0–100.0)

## 2022-03-13 NOTE — ED Provider Notes (Signed)
[name] Provider Note   CSN: [id number] Arrival date & time: 03/13/22  2423     History  Chief Complaint  Patient presents with   Dizziness    [name] is a 89 y.o. male w/ hx of CAD s/p stenting, Alzheimer's dementia, noted to be in "ongoing progressive delcine" per neuropsychologist evaluation 10/02/21, presenting to ED in the company of his wife concern for cough, congestion, generalized weakness, worsening confusion.  His wife feels that his symptoms really worsened over the past week.  She reports that it began when the patient underwent cataract surgery on his right eye about a week ago.  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  After his surgery the patient appeared to be stumbling into things more often, having difficulty with his balance.  He had about 2 low impact mechanical falls witnessed by the family.  He has also been having a dry cough on and off all week.  His wife reports the patient was complaining of "dragging my left foot" earlier in the week, although the patient does have poor ambulation at baseline and walks with a rolling walker.  The patient is not on blood thinners.  The patient is level 5 caveat due to dementia, but has no acute complaints for me in the ED.  External records reviewed including neuropsychology evaluation from August of last year where the patient is noted to have late onset Alzheimer's disease and ongoing progressive decline in function.  HPI     Home Medications Prior to Admission medications   Medication Sig Start Date End Date Taking? Authorizing Provider  albuterol (PROAIR HFA) 108 (90 Base) MCG/ACT inhaler INHALE 2 PUFFS INTO THE LUNGS EVERY 6 HOURS AS NEEDED FOR WHEEZING OR SHORTNESS OF BREATH 08/06/19   Angiulli, Lavon Paganini, PA-C  amoxicillin-clavulanate (AUGMENTIN) 875-125 MG tablet Take 1 tablet by mouth 2 (two) times daily. 02/02/22   [name], MD  atorvastatin  (LIPITOR) 20 MG tablet Take 1 tablet (20 mg total) by mouth daily. 08/17/21   [name], MD  doxazosin (CARDURA) 2 MG tablet TAKE 1 TABLET BY MOUTH EVERY DAY 01/18/22   [name], MD  empagliflozin (JARDIANCE) 10 MG TABS tablet Take 1 tablet (10 mg total) by mouth daily before breakfast. 01/16/22   [name], MD  escitalopram (LEXAPRO) 10 MG tablet TAKE 1 TABLET BY MOUTH EVERY DAY 01/19/22   [name], MD  ezetimibe (ZETIA) 10 MG tablet TAKE 1 TABLET BY MOUTH DAILY. 05/24/21   Sherren Mocha, MD  fluticasone furoate-vilanterol (BREO ELLIPTA) 200-25 MCG/ACT AEPB INHALE 1 PUFF DAILY *NEED APPT* 01/16/22   [name], MD  furosemide (LASIX) 40 MG tablet Take 1 tablet (40 mg total) by mouth every other day. 12/25/21   Sherren Mocha, MD  lisinopril (ZESTRIL) 10 MG tablet TAKE 1 TABLET BY MOUTH EVERY DAY 02/15/22   [name], MD  loratadine (CLARITIN) 10 MG tablet Take 10 mg by mouth daily as needed for allergies or rhinitis.     [provider]  meclizine (ANTIVERT) 12.5 MG tablet Take 1 tablet (12.5 mg total) by mouth 3 (three) times daily as needed for dizziness. 09/28/20   Lajean Saver, MD  memantine (NAMENDA) 5 MG tablet TAKE 1 TABLET BY MOUTH EVERYDAY AT BEDTIME 03/12/22   Cameron Sprang, MD  Multiple Vitamin (MULTIVITAMIN WITH MINERALS) TABS tablet Take 1 tablet by mouth daily.  [provider]      Allergies    Gabapentin    Review of Systems   Review of Systems  Physical Exam Updated Vital Signs BP (!) 167/65   Pulse 60   Temp 98 F (36.7 C) (Oral)   Resp 14   Ht '5\' 8"'$  (1.727 m)   Wt 88.5 kg   SpO2 98%   BMI 29.65 kg/m  Physical Exam Constitutional:      General: He is not in acute distress. HENT:     Head: Normocephalic and atraumatic.  Eyes:     Conjunctiva/sclera: Conjunctivae normal.     Pupils: Pupils are equal, round, and reactive to light.  Cardiovascular:     Rate and Rhythm: Normal rate and regular  rhythm.  Pulmonary:     Effort: Pulmonary effort is normal. No respiratory distress.  Abdominal:     General: There is no distension.     Tenderness: There is no abdominal tenderness.  Skin:    General: Skin is warm and dry.  Neurological:     General: No focal deficit present.     Mental Status: He is alert and oriented to person, place, and time. Mental status is at baseline.     ED Results / Procedures / Treatments   Labs (all labs ordered are listed, but only abnormal results are displayed) Labs Reviewed  BASIC METABOLIC PANEL - Abnormal; Notable for the following components:      Result Value   BUN 37 (*)    Creatinine, Ser 1.97 (*)    GFR, Estimated 32 (*)    All other components within normal limits  CBC WITH DIFFERENTIAL/PLATELET - Abnormal; Notable for the following components:   RBC 3.62 (*)    Hemoglobin 11.5 (*)    HCT 34.4 (*)    All other components within normal limits  BRAIN NATRIURETIC PEPTIDE - Abnormal; Notable for the following components:   B Natriuretic Peptide 259.7 (*)    All other components within normal limits  URINALYSIS, ROUTINE W REFLEX MICROSCOPIC - Abnormal; Notable for the following components:   Glucose, UA 500 (*)    Hgb urine dipstick TRACE (*)    Protein, ur TRACE (*)    All other components within normal limits  TROPONIN I (HIGH SENSITIVITY) - Abnormal; Notable for the following components:   Troponin I (High Sensitivity) 28 (*)    All other components within normal limits  TROPONIN I (HIGH SENSITIVITY) - Abnormal; Notable for the following components:   Troponin I (High Sensitivity) 27 (*)    All other components within normal limits  RESP PANEL BY RT-PCR (RSV, FLU A&B, COVID)  [id number]    EKG EKG Interpretation  Date/Time:  Tuesday March 13 2022 06:35:45 EST Ventricular Rate:  70 PR Interval:  157 QRS Duration: 109 QT Interval:  413 QTC Calculation: 446 R Axis:   -24 Text Interpretation: Sinus rhythm Incomplete left  bundle branch block No significant change was found Confirmed by Shanon Rosser [phone]) on 03/13/2022 6:51:40 AM  Radiology DG Chest 2 View  Result Date: 03/13/2022 CLINICAL DATA:  89 year old male with dizziness, shortness of breath. Query aspiration. EXAM: CHEST - 2 VIEW COMPARISON:  Portable chest 08/27/2021 and earlier. FINDINGS: PA and lateral views 0721 hours. Chronic left lung loculated pleural effusion and/or fibrothorax. Unchanged blunting of the left lung base, and patchy subpleural mid lung opacity since last year. Stable cardiac size and mediastinal contours. Small pericardial effusion in 2022. Visualized tracheal air column  is within normal limits. No pneumothorax, pulmonary edema or acute pulmonary opacity identified. No acute osseous abnormality identified. Negative visible bowel gas. IMPRESSION: Chronic left pleural effusion, fibrothorax. No acute cardiopulmonary abnormality. Electronically Signed   By: Genevie Ann M.D.   On: 03/13/2022 07:42   CT Head Wo Contrast  Result Date: 03/13/2022 CLINICAL DATA:  89 year old male is dragging the left foot. Dementia. EXAM: CT HEAD WITHOUT CONTRAST TECHNIQUE: Contiguous axial images were obtained from the base of the skull through the vertex without intravenous contrast. RADIATION DOSE REDUCTION: This exam was performed according to the departmental dose-optimization program which includes automated exposure control, adjustment of the mA and/or kV according to patient size and/or use of iterative reconstruction technique. COMPARISON:  Head CT 08/27/2021.  Brain MRI 04/15/2020. FINDINGS: Brain: Stable cerebral volume. No midline shift, ventriculomegaly, mass effect, evidence of mass lesion, intracranial hemorrhage or evidence of cortically based acute infarction. Stable gray-white differentiation since last year, with mild for age chronic white matter changes most apparent in the periatrial regions. Vascular: Calcified atherosclerosis at the skull base. No  suspicious intracranial vascular hyperdensity. Skull: No acute osseous abnormality identified. Sinuses/Orbits: Visualized paranasal sinuses and mastoids are stable and well aerated. Other: No acute orbit or scalp soft tissue finding. Postoperative changes to both globes. IMPRESSION: No acute intracranial abnormality. Stable non contrast CT appearance of mild for age white matter disease. Electronically Signed   By: Genevie Ann M.D.   On: 03/13/2022 07:40    Procedures Procedures    Medications Ordered in ED Medications - No data to display  ED Course/ Medical Decision Making/ A&P Clinical Course as of 03/13/22 0924  Tue Mar 13, 2022  0802 Patient's creatinine and hemoglobin are near baseline levels. [MT]    Clinical Course User Index [MT] Myliyah Rebuck, Carola Rhine, MD                             Medical Decision Making Amount and/or Complexity of Data Reviewed Labs: ordered. Radiology: ordered.   This patient presents to the ED with concern for multiple complaints. This involves an extensive number of treatment options, and is a complaint that carries with it a high risk of complications and morbidity.  The differential diagnosis includes progression of Alzheimer's disease versus functional vision change postsurgery versus CVA versus electrolyte derangement versus pneumonia versus other infection versus other  Co-morbidities that complicate the patient evaluation: Cardiovascular and coronary risk factors  Additional history obtained from the patient's wife at bedside  External records from outside source obtained and reviewed including cardiology and neuropsychology office notes  I ordered and personally interpreted labs.  The pertinent results include: No significant emergent findings.  No evidence of infection on UA.  Hemoglobin and creatinine near baseline levels.  The troponins are flat, very mildly elevated in the setting of chronic kidney disease.  I doubt ACS; he has no chest pain or  concerning EKG changes  I ordered imaging studies including CT scan of the head, x-ray of the chest I independently visualized and interpreted imaging which showed no emergent findings I agree with the radiologist interpretation  The patient was maintained on a cardiac monitor.  I personally viewed and interpreted the cardiac monitored which showed an underlying rhythm of: Sinus rhythm  Per my interpretation the patient's ECG shows no acute ischemic findings   I have reviewed the patients home medicines and have made adjustments as needed  Test Considered: Low suspicion  for acute PE at this time.  No indication for CT angiogram imaging   After the interventions noted above, I reevaluated the patient and found that they have: stayed the same  Dispostion:  After consideration of the diagnostic results and the patients response to treatment, I feel that the patent would benefit from outpatient PCP follow-up         Final Clinical Impression(s) / ED Diagnoses Final diagnoses:  Weakness    Rx / DC Orders ED Discharge Orders     None         Kiet Geer, Carola Rhine, MD 03/13/22 [phone]

## 2022-03-13 NOTE — ED Notes (Signed)
EDP into room, at BS.  ?

## 2022-03-13 NOTE — ED Notes (Signed)
EDP into see pt, discuss results and plan.

## 2022-03-13 NOTE — ED Triage Notes (Signed)
Multiple complaints of dizziness, shortness of breath and balance issues for app one week.

## 2022-03-13 NOTE — ED Notes (Signed)
Ambulatory back from b/r to stretcher with standby assist, steady gait. No changes. Alert, NAD, calm, interactive, speech clear.

## 2022-03-13 NOTE — ED Notes (Addendum)
To CT/ xray via w/c at this time, transfers from stretcher to w/c with stand by assist, steady on feet, pt alert, NAD, calm, interactive. Wife at [facility].

## 2022-03-15 DIAGNOSIS — H2512 Age-related nuclear cataract, left eye: Secondary | ICD-10-CM | POA: Diagnosis not present

## 2022-03-20 ENCOUNTER — Telehealth: Payer: Self-pay

## 2022-03-20 NOTE — Telephone Encounter (Signed)
     Patient  visit on 03/13/2022  at [facility] was for weakness, dizziness.  Have you been able to follow up with your primary care physician? PCP felt the patient did not need to be seen. Neurologist appointment March 13.  The patient was or was not able to obtain any needed medicine or equipment. No medication prescribed.  Are there diet recommendations that you are having difficulty following? No  Patient expresses understanding of discharge instructions and education provided has no other needs at this time. Yes.   [facility] Care Guide   [email or web address]  ?? [phone]   Website: [email or web address]  [email or web address]

## 2022-04-02 ENCOUNTER — Encounter: Payer: Self-pay | Admitting: Family Medicine

## 2022-04-02 ENCOUNTER — Ambulatory Visit (INDEPENDENT_AMBULATORY_CARE_PROVIDER_SITE_OTHER): Payer: PPO | Admitting: Family Medicine

## 2022-04-02 VITALS — BP 178/80 | HR 80 | Temp 98.0°F | Ht 68.0 in | Wt 201.0 lb

## 2022-04-02 DIAGNOSIS — R7989 Other specified abnormal findings of blood chemistry: Secondary | ICD-10-CM | POA: Diagnosis not present

## 2022-04-02 DIAGNOSIS — N1832 Chronic kidney disease, stage 3b: Secondary | ICD-10-CM

## 2022-04-02 DIAGNOSIS — R311 Benign essential microscopic hematuria: Secondary | ICD-10-CM

## 2022-04-02 DIAGNOSIS — F0390 Unspecified dementia without behavioral disturbance: Secondary | ICD-10-CM | POA: Diagnosis not present

## 2022-04-02 DIAGNOSIS — I2510 Atherosclerotic heart disease of native coronary artery without angina pectoris: Secondary | ICD-10-CM | POA: Diagnosis not present

## 2022-04-02 DIAGNOSIS — S069X9A Unspecified intracranial injury with loss of consciousness of unspecified duration, initial encounter: Secondary | ICD-10-CM | POA: Diagnosis not present

## 2022-04-02 LAB — URINALYSIS, ROUTINE W REFLEX MICROSCOPIC
Bacteria, UA: NONE SEEN /HPF
Bilirubin Urine: NEGATIVE
Hyaline Cast: NONE SEEN /LPF
Ketones, ur: NEGATIVE
Leukocytes,Ua: NEGATIVE
Nitrite: NEGATIVE
Specific Gravity, Urine: 1.02 (ref 1.001–1.035)
Squamous Epithelial / HPF: NONE SEEN /HPF (ref <=5)
WBC, UA: NONE SEEN /HPF (ref 0–5)
pH: 5.5 (ref 5.0–8.0)

## 2022-04-02 LAB — MICROSCOPIC MESSAGE

## 2022-04-02 NOTE — Assessment & Plan Note (Addendum)
Urine dipstick shows positive for RBC's, positive for protein, and positive for glucose.  Micro exam: 0-2 RBC's per HPF. Will obtain CMP/BNP and repeat UA in 1-2 weeks and order cystoscopy/CT if blood is persistent. Mild BLE edema on exam reported as back to baseline per wife. No dyspnea, orthopnea, chest pain, palpitations. He has had some weight gain. Will obtain BNP. Instructed to return to office if symptoms persist or worsen.

## 2022-04-02 NOTE — Progress Notes (Signed)
Acute Office Visit  Subjective:     Patient ID: [name], male    DOB: 03-Jul-1933, 89 y.o.   MRN: [id number]  Chief Complaint  Patient presents with   Acute Visit    back pain and change in urine/slj     HPI Patient is in today with his wife [name] for several nights of low back pain (sacral), malodorous urine, 1.4lb weight gain with BLE edema. Denies fever, flank pain, shortness of breath, wheezing, chills, body aches, dysuria  Review of Systems  Respiratory: Negative.    Cardiovascular:  Positive for leg swelling.  Gastrointestinal: Negative.   Genitourinary: Negative.   Musculoskeletal:  Positive for back pain (over sacrum).  Neurological: Negative.   All other systems reviewed and are negative.   Past Medical History:  Diagnosis Date   Acute blood loss anemia    Acute upper respiratory infections of unspecified site 11/04/2012   AKI (acute kidney injury)    Benign prostatic hyperplasia    CAD (coronary artery disease)    s/p cypher DES to pLAD 6/08; normal LVF;  ETT-Myoview 2009: no ischemia    Clavicle fracture 08/10/2019   Closed displaced fracture of phalanx of left thumb, sequela 08/10/2019   Coronary atherosclerosis of native coronary artery 03/08/2008   Dizziness 03/08/2020   Eosinophilia    Essential hypertension    Fracture of multiple ribs with pain 08/10/2019   Hematoma 11/04/2012   History of multiple falls    History of SAH (subarachnoid hemorrhage) 07/14/2019   Hyperlipidemia type IIB / III 03/08/2008   Itching of ear 07/14/2021   Major neurocognitive disorder due to possible Alzheimer's disease, without behavioral disturbance 02/29/2020   MI (myocardial infarction)    Moderate persistent asthma    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   Imaging revealed [facility]   Multiple trauma    Nocturnal leg cramps 11/23/2012   Orthostatic hypotension 03/08/2008   Otalgia of left ear 05/11/2021   Otorrhagia of left ear  03/30/2021   Pain in joint, shoulder region 11/23/2012   Stage 3b chronic kidney disease    Thrombocytopenia    Trigeminal neuralgia    Type 2 diabetes mellitus    Past Surgical History:  Procedure Laterality Date   APPENDECTOMY     CARDIAC CATHETERIZATION  07/30/2006   CORONARY ANGIOPLASTY WITH STENT PLACEMENT   CARDIAC CATHETERIZATION     EXPLORATORY LAPAROTOMY     age 38   EXPLORATORY LAPAROTOMY     IR THORACENTESIS ASP PLEURAL SPACE W/IMG GUIDE  10/05/2019   Current Outpatient Medications on File Prior to Visit  Medication Sig Dispense Refill   albuterol (PROAIR HFA) 108 (90 Base) MCG/ACT inhaler INHALE 2 PUFFS INTO THE LUNGS EVERY 6 HOURS AS NEEDED FOR WHEEZING OR SHORTNESS OF BREATH 6.7 g 0   atorvastatin (LIPITOR) 20 MG tablet Take 1 tablet (20 mg total) by mouth daily. 90 tablet 3   doxazosin (CARDURA) 2 MG tablet TAKE 1 TABLET BY MOUTH EVERY DAY 90 tablet 1   empagliflozin (JARDIANCE) 10 MG TABS tablet Take 1 tablet (10 mg total) by mouth daily before breakfast. 30 tablet 5   escitalopram (LEXAPRO) 10 MG tablet TAKE 1 TABLET BY MOUTH EVERY DAY 90 tablet 1   ezetimibe (ZETIA) 10 MG tablet TAKE 1 TABLET BY MOUTH DAILY. 90 tablet 3   fluticasone furoate-vilanterol (BREO ELLIPTA) 200-25 MCG/ACT AEPB INHALE 1 PUFF DAILY *NEED APPT* 60 each 11   furosemide (LASIX) 40 MG  tablet Take 1 tablet (40 mg total) by mouth every other day. 45 tablet 3   lisinopril (ZESTRIL) 10 MG tablet TAKE 1 TABLET BY MOUTH EVERY DAY 90 tablet 0   loratadine (CLARITIN) 10 MG tablet Take 10 mg by mouth daily as needed for allergies or rhinitis.      meclizine (ANTIVERT) 12.5 MG tablet Take 1 tablet (12.5 mg total) by mouth 3 (three) times daily as needed for dizziness. 15 tablet 0   memantine (NAMENDA) 5 MG tablet TAKE 1 TABLET BY MOUTH EVERYDAY AT BEDTIME 30 tablet 0   Multiple Vitamin (MULTIVITAMIN WITH MINERALS) TABS tablet Take 1 tablet by mouth daily.     amoxicillin-clavulanate (AUGMENTIN) 875-125 MG  tablet Take 1 tablet by mouth 2 (two) times daily. (Patient not taking: Reported on 04/02/2022) 20 tablet 0   No current facility-administered medications on file prior to visit.   Allergies  Allergen Reactions   Gabapentin Other (See Comments)    Visual changes and confusion- "I went blind while I was driving"       Objective:    BP (!) 178/80   Pulse 80   Temp 98 F (36.7 C) (Oral)   Ht '5\' 8"'$  (1.727 m)   Wt 201 lb (91.2 kg)   SpO2 97%   BMI 30.56 kg/m    Physical Exam Vitals and nursing note reviewed.  Constitutional:      Appearance: Normal appearance. He is normal weight.  HENT:     Head: Normocephalic and atraumatic.  Cardiovascular:     Rate and Rhythm: Normal rate and regular rhythm.     Pulses: Normal pulses.     Heart sounds: Normal heart sounds.  Pulmonary:     Effort: Pulmonary effort is normal.     Breath sounds: Normal breath sounds.  Abdominal:     Tenderness: There is no right CVA tenderness or left CVA tenderness.  Musculoskeletal:     Lumbar back: No bony tenderness.     Right lower leg: 1+ Pitting Edema present.     Left lower leg: 1+ Pitting Edema present.  Skin:    General: Skin is warm and dry.     Capillary Refill: Capillary refill takes less than 2 seconds.  Neurological:     General: No focal deficit present.     Mental Status: He is alert and oriented to person, place, and time. Mental status is at baseline.  Psychiatric:        Mood and Affect: Mood normal.        Behavior: Behavior normal.        Thought Content: Thought content normal.        Judgment: Judgment normal.     No results found for any visits on 04/02/22.      Assessment & Plan:   Problem List Items Addressed This Visit       Genitourinary   Stage 3b chronic kidney disease ([place]) - Primary    Urine dipstick shows positive for RBC's, positive for protein, and positive for glucose.  Micro exam: 0-2 RBC's per HPF. Will obtain CMP/CBC/BNP and repeat UA in 1-2 weeks.  Instructed to return to office if symptoms persist or worsen.       Relevant Orders   COMPLETE METABOLIC PANEL WITH GFR   Urinalysis, Routine w reflex microscopic   Other Visit Diagnoses     Elevated brain natriuretic peptide (BNP) level       Relevant Orders   Brain natriuretic peptide  Benign essential microscopic hematuria       Relevant Orders   Urinalysis, Routine w reflex microscopic       No orders of the defined types were placed in this encounter.   Return in about 1 week (around 04/09/2022) for UA.  Rubie Maid, FNP

## 2022-04-03 ENCOUNTER — Telehealth: Payer: Self-pay | Admitting: Family Medicine

## 2022-04-03 LAB — BRAIN NATRIURETIC PEPTIDE: Brain Natriuretic Peptide: 146 pg/mL — ABNORMAL HIGH (ref <100)

## 2022-04-03 LAB — COMPLETE METABOLIC PANEL WITH GFR
AG Ratio: 1.6 (calc) (ref 1.0–2.5)
ALT: 13 U/L (ref 9–46)
AST: 25 U/L (ref 10–35)
Albumin: 3.9 g/dL (ref 3.6–5.1)
Alkaline phosphatase (APISO): 93 U/L (ref 35–144)
BUN/Creatinine Ratio: 19 (calc) (ref 6–22)
BUN: 38 mg/dL — ABNORMAL HIGH (ref 7–25)
CO2: 25 mmol/L (ref 20–32)
Calcium: 8.9 mg/dL (ref 8.6–10.3)
Chloride: 108 mmol/L (ref 98–110)
Creat: 2.02 mg/dL — ABNORMAL HIGH (ref 0.70–1.22)
Globulin: 2.5 g/dL (calc) (ref 1.9–3.7)
Glucose, Bld: 87 mg/dL (ref 65–99)
Potassium: 5.3 mmol/L (ref 3.5–5.3)
Sodium: 140 mmol/L (ref 135–146)
Total Bilirubin: 0.5 mg/dL (ref 0.2–1.2)
Total Protein: 6.4 g/dL (ref 6.1–8.1)
eGFR: 32 mL/min/{1.73_m2} — ABNORMAL LOW (ref >=60)

## 2022-04-03 NOTE — Telephone Encounter (Signed)
Called patient, spoke with his wife regarding lab results. Offered cystoscopy for persistent hematuria. They will discuss and notify office if they would like to pursue this.

## 2022-04-05 ENCOUNTER — Encounter: Payer: Self-pay | Admitting: Family Medicine

## 2022-04-06 HISTORY — PX: CATARACT EXTRACTION: SUR2

## 2022-04-09 ENCOUNTER — Encounter: Payer: Self-pay | Admitting: Family Medicine

## 2022-04-09 ENCOUNTER — Other Ambulatory Visit: Payer: Self-pay | Admitting: Neurology

## 2022-04-09 DIAGNOSIS — F0390 Unspecified dementia without behavioral disturbance: Secondary | ICD-10-CM

## 2022-04-09 NOTE — Telephone Encounter (Signed)
Pls see msg, call pt to make an appt w/[facility] if possible

## 2022-04-09 NOTE — Telephone Encounter (Signed)
Can get further refills on visit

## 2022-04-13 ENCOUNTER — Ambulatory Visit: Payer: PPO | Admitting: Family Medicine

## 2022-04-16 ENCOUNTER — Encounter: Payer: Self-pay | Admitting: Family Medicine

## 2022-04-17 ENCOUNTER — Encounter: Payer: Self-pay | Admitting: Physician Assistant

## 2022-04-17 ENCOUNTER — Ambulatory Visit (INDEPENDENT_AMBULATORY_CARE_PROVIDER_SITE_OTHER): Payer: PPO | Admitting: Physician Assistant

## 2022-04-17 VITALS — BP 151/75 | HR 85 | Wt 201.6 lb

## 2022-04-17 DIAGNOSIS — F0390 Unspecified dementia without behavioral disturbance: Secondary | ICD-10-CM | POA: Diagnosis not present

## 2022-04-17 NOTE — Patient Instructions (Addendum)
It was a pleasure to see you today at our office.   Recommendations:  Follow up Fri Sept 13  at 11:30  Continue Memantine 5 mg twice daily.    Whom to call:  Memory  decline, memory medications: Call our office [phone]   For psychiatric meds, mood meds: Please have your primary care physician manage these medications.   Counseling regarding caregiver distress, including caregiver depression, anxiety and issues regarding community resources, adult day care programs, adult living facilities, or memory care questions:   Feel free to contact [place], Social Worker at [phone]   For assessment of decision of mental capacity and competency:  Call Dr. Anthoney Harada, geriatric psychiatrist at [phone]  For guidance in geriatric dementia issues please call [facility] [phone]  For guidance regarding [facility] and if placement were needed at the facility, contact Arnell Asal, Social Worker tel: [phone]  If you have any severe symptoms of a stroke, or other severe issues such as confusion,severe chills or fever, etc call 911 or go to the ER as you may need to be evaluated further   Feel free to visit Facebook page " [email or web address]" for tips of how to care for people with memory problems.       RECOMMENDATIONS FOR ALL PATIENTS WITH MEMORY PROBLEMS: 1. Continue to exercise (Recommend 30 minutes of walking everyday, or 3 hours every week) 2. Increase social interactions - continue going to [place] and enjoy social gatherings with friends and family 3. Eat healthy, avoid fried foods and eat more fruits and vegetables 4. Maintain adequate blood pressure, blood sugar, and blood cholesterol level. Reducing the risk of stroke and cardiovascular disease also helps promoting better memory. 5. Avoid stressful situations. Live a simple life and avoid aggravations. Organize your time and prepare for the next day in anticipation. 6. Sleep  well, avoid any interruptions of sleep and avoid any distractions in the bedroom that may interfere with adequate sleep quality 7. Avoid sugar, avoid sweets as there is a strong link between excessive sugar intake, diabetes, and cognitive impairment We discussed the Mediterranean diet, which has been shown to help patients reduce the risk of progressive memory disorders and reduces cardiovascular risk. This includes eating fish, eat fruits and green leafy vegetables, nuts like almonds and hazelnuts, walnuts, and also use olive oil. Avoid fast foods and fried foods as much as possible. Avoid sweets and sugar as sugar use has been linked to worsening of memory function.  There is always a concern of gradual progression of memory problems. If this is the case, then we may need to adjust level of care according to patient needs. Support, both to the patient and caregiver, should then be put into place.    The [employer] Association is here all day, every day for people facing Alzheimer's disease through our free 24/7 Helpline: [phone]. The Helpline provides reliable information and support to all those who need assistance, such as individuals living with memory loss, Alzheimer's or other dementia, caregivers, health care professionals and the public.  Our highly trained and knowledgeable staff can help you with: Understanding memory loss, dementia and Alzheimer's  Medications and other treatment options  General information about aging and brain health  Skills to provide quality care and to find the best care from professionals  Legal, financial and living-arrangement decisions Our Helpline also features: Confidential care consultation provided by master's level clinicians who can help with decision-making support, crisis assistance and education on  issues families face every day  Help in a caller's preferred language using our translation service that features more than 200 languages and dialects   Referrals to local community programs, services and ongoing support     FALL PRECAUTIONS: Be cautious when walking. Scan the area for obstacles that may increase the risk of trips and falls. When getting up in the mornings, sit up at the edge of the bed for a few minutes before getting out of bed. Consider elevating the bed at the head end to avoid drop of blood pressure when getting up. Walk always in a well-lit room (use night lights in the walls). Avoid area rugs or power cords from appliances in the middle of the walkways. Use a walker or a cane if necessary and consider physical therapy for balance exercise. Get your eyesight checked regularly.  FINANCIAL OVERSIGHT: Supervision, especially oversight when making financial decisions or transactions is also recommended.  HOME SAFETY: Consider the safety of the kitchen when operating appliances like stoves, microwave oven, and blender. Consider having supervision and share cooking responsibilities until no longer able to participate in those. Accidents with firearms and other hazards in the house should be identified and addressed as well.   ABILITY TO BE LEFT ALONE: If patient is unable to contact 911 operator, consider using LifeLine, or when the need is there, arrange for someone to stay with patients. Smoking is a fire hazard, consider supervision or cessation. Risk of wandering should be assessed by caregiver and if detected at any point, supervision and safe proof recommendations should be instituted.  MEDICATION SUPERVISION: Inability to self-administer medication needs to be constantly addressed. Implement a mechanism to ensure safe administration of the medications.   DRIVING: Regarding driving, in patients with progressive memory problems, driving will be impaired. We advise to have someone else do the driving if trouble finding directions or if minor accidents are reported. Independent driving assessment is available to determine  safety of driving.   If you are interested in the driving assessment, you can contact the following:  [employer] in [place]  [place] [phone] or [phone]      [place] refers to food and lifestyle choices that are based on the traditions of countries located on the [employer]. This way of eating has been shown to help prevent certain conditions and improve outcomes for people who have chronic diseases, like kidney disease and heart disease. What are tips for following this plan? Lifestyle  Cook and eat meals together with your family, when possible. Drink enough fluid to keep your urine clear or pale yellow. Be physically active every day. This includes: Aerobic exercise like running or swimming. Leisure activities like gardening, walking, or housework. Get 7-8 hours of sleep each night. If recommended by your health care provider, drink red wine in moderation. This means 1 glass a day for nonpregnant women and 2 glasses a day for men. A glass of wine equals 5 oz (150 mL). Reading food labels  Check the serving size of packaged foods. For foods such as rice and pasta, the serving size refers to the amount of cooked product, not dry. Check the total fat in packaged foods. Avoid foods that have saturated fat or trans fats. Check the ingredients list for added sugars, such as corn syrup. Shopping  At the grocery store, buy most of your food from the areas near the  walls of the store. This includes: Fresh fruits and vegetables (produce). Grains, beans, nuts, and seeds. Some of these may be available in unpackaged forms or large amounts (in bulk). Fresh seafood. Poultry and eggs. Low-fat dairy products. Buy whole ingredients instead of prepackaged foods. Buy fresh fruits and vegetables in-season from local farmers  markets. Buy frozen fruits and vegetables in resealable bags. If you do not have access to quality fresh seafood, buy precooked frozen shrimp or canned fish, such as tuna, salmon, or sardines. Buy small amounts of raw or cooked vegetables, salads, or olives from the deli or salad bar at your store. Stock your pantry so you always have certain foods on hand, such as olive oil, canned tuna, canned tomatoes, rice, pasta, and beans. Cooking  Cook foods with extra-virgin olive oil instead of using butter or other vegetable oils. Have meat as a side dish, and have vegetables or grains as your main dish. This means having meat in small portions or adding small amounts of meat to foods like pasta or stew. Use beans or vegetables instead of meat in common dishes like chili or lasagna. Experiment with different cooking methods. Try roasting or broiling vegetables instead of steaming or sauteing them. Add frozen vegetables to soups, stews, pasta, or rice. Add nuts or seeds for added healthy fat at each meal. You can add these to yogurt, salads, or vegetable dishes. Marinate fish or vegetables using olive oil, lemon juice, garlic, and fresh herbs. Meal planning  Plan to eat 1 vegetarian meal one day each week. Try to work up to 2 vegetarian meals, if possible. Eat seafood 2 or more times a week. Have healthy snacks readily available, such as: Vegetable sticks with hummus. Greek yogurt. Fruit and nut trail mix. Eat balanced meals throughout the week. This includes: Fruit: 2-3 servings a day Vegetables: 4-5 servings a day Low-fat dairy: 2 servings a day Fish, poultry, or lean meat: 1 serving a day Beans and legumes: 2 or more servings a week Nuts and seeds: 1-2 servings a day Whole grains: 6-8 servings a day Extra-virgin olive oil: 3-4 servings a day Limit red meat and sweets to only a few servings a month What are my food choices? Mediterranean diet Recommended Grains: Whole-grain pasta. Brown  rice. Bulgar wheat. Polenta. Couscous. Whole-wheat bread. [place]. Vegetables: Artichokes. Beets. Broccoli. Cabbage. Carrots. Eggplant. Green beans. Chard. Kale. Spinach. Onions. Leeks. Peas. Squash. Tomatoes. Peppers. Radishes. Fruits: Apples. Apricots. Avocado. Berries. Bananas. Cherries. Dates. Figs. Grapes. Lemons. Melon. Oranges. Peaches. Plums. Pomegranate. Meats and other protein foods: Beans. Almonds. Sunflower seeds. Pine nuts. Peanuts. [place]. Salmon. Scallops. Shrimp. [place]. Tilapia. Clams. Oysters. Eggs. Dairy: Low-fat milk. Cheese. Greek yogurt. Beverages: Water. Red wine. Herbal tea. Fats and oils: Extra virgin olive oil. Avocado oil. Grape seed oil. Sweets and desserts: [place] yogurt with honey. Baked apples. Poached pears. Trail mix. Seasoning and other foods: Basil. Cilantro. Coriander. Cumin. Mint. Parsley. Sage. Rosemary. Tarragon. Garlic. Oregano. Thyme. Pepper. Balsalmic vinegar. Tahini. Hummus. Tomato sauce. Olives. Mushrooms. Limit these Grains: Prepackaged pasta or rice dishes. Prepackaged cereal with added sugar. Vegetables: Deep fried potatoes (french fries). Fruits: Fruit canned in syrup. Meats and other protein foods: Beef. Pork. Lamb. Poultry with skin. Hot dogs. [place] Salines. Dairy: Ice cream. Sour cream. Whole milk. Beverages: Juice. Sugar-sweetened soft drinks. Beer. Liquor and spirits. Fats and oils: Butter. Canola oil. Vegetable oil. Beef fat (tallow). Lard. Sweets and desserts: Cookies. Cakes. Pies. Candy. Seasoning and other foods: Mayonnaise. Premade sauces and marinades. The items  listed may not be a complete list. Talk with your dietitian about what dietary choices are right for you. Summary The Mediterranean diet includes both food and lifestyle choices. Eat a variety of fresh fruits and vegetables, beans, nuts, seeds, and whole grains. Limit the amount of red meat and sweets that you eat. Talk with your health care provider about whether it is safe for you  to drink red wine in moderation. This means 1 glass a day for nonpregnant women and 2 glasses a day for men. A glass of wine equals 5 oz (150 mL). This information is not intended to replace advice given to you by your health care provider. Make sure you discuss any questions you have with your health care provider. Document Released: 09/15/2015 Document Revised: 10/18/2015 Document Reviewed: 09/15/2015 Elsevier Interactive Patient Education  2017 [employer].

## 2022-04-17 NOTE — Progress Notes (Signed)
Assessment/Plan:   Dementia likely due to Alzheimer's disease  [name] is a very pleasant 89 y.o. RH male  with a history of hypertension, CAD, diabetes, hyperlipidemia, COPD, DM2, stage IIIb CKD, with a diagnosis of mild dementia likely due to Alzheimer's disease per neuropsychological evaluation on January 2022 seen today in follow up for memory loss. Patient is currently on memantine 5 mg nightly. Last MRI brain personally reviewed was remarkable for mild diffuse atrophy and chronic microvascular disease. MMSE today is 19/30.  Discussed with the patient the possibility of increasing memantine to 10 mg daily, however, he is dealing with cardiac issues including CHF and some of the blood pressure medications are to undergo dosage change.  Because the patient as a side effect was experiencing dizziness with memantine, just taking it only at night, would prefer to first address the blood pressure issues with his physician, as the risks of memantine may outweigh the benefits at this moment, especially since he is at risk for falls.   Recommendations    Follow up in  6 months. Continue memantine 5 mg nightly, may entertain increasing it to 10 mg nightly if tolerated (dizziness) Continue to control mood as per PCP Continue to control cardiovascular risk factors    Subjective:    This patient is accompanied in the office by his wife  who supplements the history.  Previous records as well as any outside records available were reviewed prior to todays visit. Patient was last seen on 10/16/2021    Any changes in memory since last visit? About the same" Wife says he forgets a lot.  He tries to maintain his cognitive status by reading prior books, listening to classical music, and singing.  He may experience some difficulty retaining recent conversations, but his long-term memory is still adequate. Repeats oneself?  Endorsed Disoriented when walking into a room?  Patient denies, but wife  reports that   "at night he may wonder" Leaving objects in unusual places?  He loses things, but does not play ascending unusual locations  Wandering behavior?  denies   Any personality changes since last visit?  denies   Any worsening depression?:"He is grumpier than he used to be" Hallucinations or paranoia? Not for the last 3 weeks, he has not been seeing his mother as before.  Recently "He was sure he had a second wife [name] and she was coming to dinner, so he began to look for her " Seizures?    denies    Any sleep changes? He pulls leg up and swings, sometimes he grabs the railing in his bed (REM behavior) + vivid dreams, denies sleepwalking Sleep apnea?   denies   Any hygiene concerns?  He needs to be reminded of it.  Independent of bathing and dressing? She has to help him to get dressed and speak the clothes choice but not always.. Does the patient needs help with medications?  Wife is in charge  Who is in charge of the finances?  Wife is in charge     Any changes in appetite?  He may forget that he ate and he eats again    Patient have trouble swallowing?  denies   Does the patient cook? Only when supervised  Headaches? denies Ambulates with difficulty?  Endorsed, the patient uses a walker to prevent falls. Recent falls or head injuries?  Endorsed, he had to witnessed low impact mechanical falls in February of this year, no loss of consciousness.  Uses a  walker since.  Unilateral weakness, numbness or tingling?    denies   Any tremors?  denies   Any anosmia?  Patient denies   Any incontinence of urine?  denies   Any bowel dysfunction?  Sometimes he has bowel incontinence but is likely due to GI issues although wife reports that they are not to pursue any aggressive therapy or testing. Patient lives  with his wife  does the patient drive? No longer drives  Neuropsych evaluation 09/29/21 [name]  Major Neurocognitive Disorder ("dementia") at the present time.   Regarding  etiology, primary concerns remain for a late-onset Alzheimer's disease presentation remain. [name] did not benefit from repeated exposure to information across learning trials, was essentially amnestic across all memory tasks after a brief delay, and performed poorly across recognition trials. This suggests evidence for rapid forgetting and a significant memory storage impairment, both of which are hallmark characteristics of this illness. A weakness in semantic fluency (relative to phonemic fluency), variability across confrontation naming, and impaired clock drawing would further align with typical disease trajectory. Given subtle decline in the past 18 months, this would suggest a slow moving disease process, which is encouraging.    There are likely secondary contributions to his overall clinical presentation stemming from his prior small brain bleed/TBI, as well as recent neuroimaging suggesting mild microvascular ischemic disease. These could be largely responsible for deficits surrounding processing speed, complex attention, and cognitive flexibility. Visuospatial variability could be attributed to his TBI given some likely parietal lobe involvement. However, these factors would not explain amnestic memory and overall memory impairment by themselves. As such, I continue to feel that these factors serve to worsen an underlying neurodegenerative illness, most likely late-onset Alzheimer's disease, at the present time.      History on Initial Assessment 03/10/2020: This is a pleasant 89 year old right-handed man with a history of hypertension, CAD, diabetes, hyperlipidemia, presenting for management of Alzheimer's disease. He underwent Neuropsychological testing in last month with findings concerning for AD. Records were reviewed. He had an unwitnessed fall in June 2021 and sustained several fractures and a subarachnoid hemorrhage on the left that did not require surgical intervention. He started  reporting forgetfulness at that time. He was discharged home but back in the hospital a month later with headache, low back pain and fever due to UTI. Head CT showed a left hemispheric subdural hemorrhage, new from prior CT, resolved on follow-up CT in September 2021. He had another fall in November 2021 when he missed a step. He was back in the ER a few weeks later due to increasing confusion and forgetfulness, frequent right-sided headaches, and a clicking noise in his neck, attributed to his recent fall. He was back in the ER again in January 2022 for dizziness, worse when he moves quickly or gets up too quickly, leading to falls. 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 Vestibular therapy helped with dizziness. Neuropsychological evaluation indicated prominent impairment surrounding all aspects of learning and memory, meeting criteria for Major Neurocognitive Disorder, mild end of spectrum, etiology concerning for Alzheimer's disease. There may be a vascular component as well. Prior TBI exacerbated deficits that were already present rather than being primarily responsible for dysfunction.    They report his long-term memory is good, he can remember long passages from [place] some memory issues prior to the fall, for instance  he would recall a story differently or he could not keep an appointment date in his mind or a prior decision they had made. He has not been driving. He manages his own pillbox with a good system, [name] checks behind him and notes he makes mistakes sometimes. He denies any difficulties managing finances, [name] has been managing them since the fall in June and notes there was a notification from the bank prior to his fall. He is fairly independent, [name] stays close during dressing and bathing mostly for safety purposes. He gets dizzy the most when  bending down. He uses a cane all the time. He states mood is not bad, "I'm not miserable." A few times he would ask their daughter where her mother or when [name] is coming back when she is right there. He thinks the daughter who lives with them is a boy, using male pronouns for her, which is new. There have been times he thinks there is a second cat or the cat is a dog, mostly at night. He used to think they had 2 bedrooms.    He denies any headaches. He has occasional vertigo and feels like he would fall, last fall was in January. Dizziness mostly occurs when he gets up, but he has also reported it while sitting or supine. It lasts at most 2 minutes, no nausea/vomiting, vision changes. The other night he woke up and saw nothing except for 2 black rings that he described as full black moons, this has happened twice. He has left trigeminal neuralgia. He was having orthostatic hypotension initially after the fall and they had to stop PT. BP has improved but he still has occasional dizziness. No focal numbness/tingling/weakness. He has neck pain and notes a "clicking" in his neck. Cervical xray showed cervical spondylosis, trace grade 1 anterolisthesis at C2-3, C3-4, C5-6, and C6-7. He has low back pain. No bowel/bladder dysfunction. No anosmia but sometimes smells food when there is none, usually at night. He has mild hand tremors. He has occasional daytime drowsiness, sleeping on his back due to pain, however [name] notes he sounds like he is gasping for breath. He snores sometimes.    Diagnostic Data:    Neuropsychological evaluation in 02/29/2020 with a diagnosis of Major Neurocognitive Disorder, etiology concerning for late-onset Alzheimer's disease. "There was prominent impairment surrounding all aspects of learning and memory. Additional impairments were seen across aspects of executive functioning (i.e., cognitive flexibility and response inhibition), while performance variability was exhibited across  attention/concentration, expressive language, and visuospatial abilities. Regarding his prior TBI, it is more likely that the presence of this injury exacerbated deficits that were already present and worsening due to an underlying cognitive disorder rather than this injury being primarily responsible for the aforementioned dysfunction. His pattern of performance is inconsistent with what would be expected from his TBI alone with memory dysfunction being far more severe. Repeat testing recommended in 12-18 months."   MRI brain with and without contrast done 04/2020 did not show any acute changes. 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    His exam on initial visit also showed hyperreflexia and fasciculations, and he was reporting neck pain. MRI cervical spine did not show any cord abnormalities, there was mild to moderate spinal stenosis and foraminal stenosis at several levels.    EMG/NCV was normal, no evidence of motor neuron disease.  PREVIOUS MEDICATIONS: Rivastigmine "hollow head "  CURRENT MEDICATIONS:  Outpatient Encounter Medications as of 04/17/2022  Medication Sig   albuterol (PROAIR HFA) 108 (90 Base) MCG/ACT inhaler INHALE 2 PUFFS INTO THE LUNGS EVERY 6 HOURS AS NEEDED FOR WHEEZING OR SHORTNESS OF BREATH   atorvastatin (LIPITOR) 20 MG tablet Take 1 tablet (20 mg total) by mouth daily.   doxazosin (CARDURA) 2 MG tablet TAKE 1 TABLET BY MOUTH EVERY DAY   empagliflozin (JARDIANCE) 10 MG TABS tablet Take 1 tablet (10 mg total) by mouth daily before breakfast.   escitalopram (LEXAPRO) 10 MG tablet TAKE 1 TABLET BY MOUTH EVERY DAY   ezetimibe (ZETIA) 10 MG tablet TAKE 1 TABLET BY MOUTH DAILY.   fluticasone furoate-vilanterol (BREO ELLIPTA) 200-25 MCG/ACT AEPB INHALE 1 PUFF DAILY *NEED APPT*   furosemide (LASIX) 40 MG tablet Take 1 tablet (40 mg total) by mouth every other day.    lisinopril (ZESTRIL) 10 MG tablet TAKE 1 TABLET BY MOUTH EVERY DAY (Patient taking differently: Take 20 mg by mouth daily.)   loratadine (CLARITIN) 10 MG tablet Take 10 mg by mouth daily as needed for allergies or rhinitis.    meclizine (ANTIVERT) 12.5 MG tablet Take 1 tablet (12.5 mg total) by mouth 3 (three) times daily as needed for dizziness.   memantine (NAMENDA) 5 MG tablet TAKE 1 TABLET BY MOUTH EVERYDAY AT BEDTIME   Multiple Vitamin (MULTIVITAMIN WITH MINERALS) TABS tablet Take 1 tablet by mouth daily.   amoxicillin-clavulanate (AUGMENTIN) 875-125 MG tablet Take 1 tablet by mouth 2 (two) times daily. (Patient not taking: Reported on 04/02/2022)   No facility-administered encounter medications on file as of 04/17/2022.       04/17/2022   12:00 PM 09/09/2020   12:00 PM  MMSE - Mini Mental State Exam  Orientation to time 0 1  Orientation to Place 2 5  Registration 3 3  Attention/ Calculation 5 5  Recall 0 0  Language- name 2 objects 2 2  Language- repeat 1 1  Language- follow 3 step command 3 2  Language- read & follow direction 1 1  Write a sentence 1 1  Copy design 1 0  Total score 19 21       No data to display          Objective:     PHYSICAL EXAMINATION:    VITALS:   Vitals:   04/17/22 1129 04/17/22 1226  BP: (!) 161/72 (!) 151/75  Pulse: 85   SpO2: 97%   Weight: 201 lb 9.6 oz (91.4 kg)     GEN:  The patient appears stated age and is in NAD. HEENT:  Normocephalic, atraumatic.   Neurological examination:  General: NAD, well-groomed, appears stated age. Orientation: The patient is alert. Oriented to person, not to place or date Cranial nerves: There is good facial symmetry.The speech is fluent and clear. No aphasia or dysarthria. Fund of knowledge is appropriate. Recent and remote memory are impaired. Attention and concentration are reduced.  Able to name objects and repeat phrases.  Hearing is intact to conversational tone.  Sensation: Sensation is intact  to light touch throughout Motor: Strength is at least antigravity x4. DTR's 1/4 in UE/LE     Movement examination: Tone: There is normal tone in the UE/LE Abnormal movements:  no tremor.  No myoclonus.  No asterixis.   Coordination:  There is no decremation with RAM's. Normal finger to nose  Gait and Station: The patient has no difficulty arising out of a deep-seated chair without the use of the hands. The  patient's stride length is shorter than prior.  Gait is cautious and narrow.  Uses a walker to ambulate.  No ataxia   Thank you for allowing [place] the opportunity to participate in the care of this nice patient. Please do not hesitate to contact us for any questions or concerns.   Total time spent on today's visit was 47 minutes dedicated to this patient today, preparing to see patient, examining the patient, ordering tests and/or medications and counseling the patient, documenting clinical information in the EHR or other health record, independently interpreting results and communicating results to the patient/family, discussing treatment and goals, answering patient's questions and coordinating care.  Cc:  [name], MD  Sharene Butters 04/17/2022 12:34 PM

## 2022-04-18 ENCOUNTER — Ambulatory Visit (INDEPENDENT_AMBULATORY_CARE_PROVIDER_SITE_OTHER): Payer: PPO | Admitting: Podiatry

## 2022-04-18 ENCOUNTER — Encounter: Payer: Self-pay | Admitting: Podiatry

## 2022-04-18 DIAGNOSIS — E119 Type 2 diabetes mellitus without complications: Secondary | ICD-10-CM

## 2022-04-18 DIAGNOSIS — M79609 Pain in unspecified limb: Secondary | ICD-10-CM

## 2022-04-18 DIAGNOSIS — B351 Tinea unguium: Secondary | ICD-10-CM

## 2022-04-18 NOTE — Progress Notes (Signed)
  Subjective:  Patient ID: [name], male    DOB: 1933/05/06,  MRN: [id number]  [name] presents to clinic today for preventative diabetic foot care and painful elongated overgrown toenails which are tender when wearing enclosed shoe gear.  Chief Complaint  Patient presents with   Nail Problem    [facility] PCP-[facility] PCP VST-1 month ago   New problem(s): None.   PCP is [name], MD.  Allergies  Allergen Reactions   Gabapentin Other (See Comments)    Visual changes and confusion- "I went blind while I was driving"    Review of Systems: Negative except as noted in the HPI.  Objective: No changes noted in today's physical examination. There were no vitals filed for this visit. [name] is a pleasant 89 y.o. male WD, WN in NAD. AAO x 3.  Vascular Examination: CFT <3 seconds b/l. DP pulses palpable b/l. PT pulses nonpalpable b/l secondary to edema. +Edema b/l ankles. Digital hair absent. Skin temperature gradient warm to warm b/l. No pain with calf compression. No ischemia or gangrene. No cyanosis or clubbing noted b/l.    Neurological Examination: Sensation grossly intact b/l with 10 gram monofilament. Vibratory sensation intact b/l.   Dermatological Examination: Pedal skin warm and supple b/l. No open wounds b/l. No interdigital macerations. Toenails 1-5 b/l thick, discolored, elongated with subungual debris and pain on dorsal palpation.   He has new nailplate underneath old nailplate of right great toe. Old nailplate is slightly incurvated on medial and lateral borders. No erythema, no edema, no drainage, no fluctuance.  No hyperkeratotic nor porokeratotic lesions present on today's visit.  Musculoskeletal Examination: Muscle strength 5/5 to b/l LE. No pain, crepitus or joint limitation noted with ROM bilateral LE. No gross bony deformities bilaterally.  Radiographs: None  Assessment/Plan: 1. Pain due to onychomycosis of nail   2. Type 2  diabetes mellitus without complication, without long-term current use of insulin ([place])     -Patient was evaluated and treated. All patient's and/or POA's questions/concerns answered on today's visit. -Patient has incurvated nailplate right great toe. Wife instructed to call office if it starts to bother him. She related understanding. -Toenails 1-5 b/l were debrided in length and girth with sterile nail nippers and dremel without iatrogenic bleeding.  -No invasive procedure(s) performed. Offending nail border debrided and curretaged right great toe utilizing sterile nail nipper and currette. Border(s) cleansed with alcohol and triple antibiotic ointment applied. Patient/POA/Caregiver/Facility instructed to apply Neosporin with Pain Relief  to right great toe once daily for 7 days. Call office if there are any concerns. -Patient/POA to call should there be question/concern in the interim.   Return in about 3 months (around 07/19/2022).  Marzetta Board, DPM

## 2022-04-20 ENCOUNTER — Encounter: Payer: Self-pay | Admitting: Family Medicine

## 2022-04-20 ENCOUNTER — Ambulatory Visit (INDEPENDENT_AMBULATORY_CARE_PROVIDER_SITE_OTHER): Payer: PPO | Admitting: Family Medicine

## 2022-04-20 VITALS — BP 132/64 | HR 69 | Temp 97.5°F | Ht 68.0 in | Wt 200.4 lb

## 2022-04-20 DIAGNOSIS — F0390 Unspecified dementia without behavioral disturbance: Secondary | ICD-10-CM | POA: Diagnosis not present

## 2022-04-20 DIAGNOSIS — I5030 Unspecified diastolic (congestive) heart failure: Secondary | ICD-10-CM | POA: Diagnosis not present

## 2022-04-20 DIAGNOSIS — N1832 Chronic kidney disease, stage 3b: Secondary | ICD-10-CM

## 2022-04-20 DIAGNOSIS — I10 Essential (primary) hypertension: Secondary | ICD-10-CM | POA: Diagnosis not present

## 2022-04-20 NOTE — Progress Notes (Signed)
Subjective:    Patient ID: [name], male    DOB: 1933-11-08, 89 y.o.   MRN: [id number]    Patient is a very pleasant 89 year old Caucasian gentleman with a history of congestive heart failure with preserved ejection fraction, hypertension, chronic kidney disease, type 2 diabetes, and memory impairment/dementia who presents today for follow-up.  We recently increased his lisinopril to 20 mg a day for hypertension.  His blood pressure is now well-controlled.  Wife states that his blood pressure is running [id number] systolic.  He is using Lasix sparingly only as needed.  He is taking Jardiance on a daily basis.  He is here today to recheck his renal function to ensure that there has not been an adverse effect to his kidney function after increasing the lisinopril Past Medical History:  Diagnosis Date   Acute blood loss anemia    Acute upper respiratory infections of unspecified site 11/04/2012   AKI (acute kidney injury)    Benign prostatic hyperplasia    CAD (coronary artery disease)    s/p cypher DES to pLAD 6/08; normal LVF;  ETT-Myoview 2009: no ischemia    Clavicle fracture 08/10/2019   Closed displaced fracture of phalanx of left thumb, sequela 08/10/2019   Coronary atherosclerosis of native coronary artery 03/08/2008   Dizziness 03/08/2020   Eosinophilia    Essential hypertension    Fracture of multiple ribs with pain 08/10/2019   Hematoma 11/04/2012   History of multiple falls    History of SAH (subarachnoid hemorrhage) 07/14/2019   Hyperlipidemia type IIB / III 03/08/2008   Itching of ear 07/14/2021   Major neurocognitive disorder due to possible Alzheimer's disease, without behavioral disturbance 02/29/2020   MI (myocardial infarction)    Moderate persistent asthma    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   Imaging revealed [facility]   Multiple trauma    Nocturnal leg cramps 11/23/2012   Orthostatic hypotension 03/08/2008   Otalgia of left ear  05/11/2021   Otorrhagia of left ear 03/30/2021   Pain in joint, shoulder region 11/23/2012   Stage 3b chronic kidney disease    Thrombocytopenia    Trigeminal neuralgia    Type 2 diabetes mellitus    Past Surgical History:  Procedure Laterality Date   APPENDECTOMY     CARDIAC CATHETERIZATION  07/30/2006   CORONARY ANGIOPLASTY WITH STENT PLACEMENT   CARDIAC CATHETERIZATION     CATARACT EXTRACTION Right 03/2022   CATARACT EXTRACTION Left 04/2022   EXPLORATORY LAPAROTOMY     age 30   EXPLORATORY LAPAROTOMY     IR THORACENTESIS ASP PLEURAL SPACE W/IMG GUIDE  10/05/2019   Current Outpatient Medications on File Prior to Visit  Medication Sig Dispense Refill   albuterol (PROAIR HFA) 108 (90 Base) MCG/ACT inhaler INHALE 2 PUFFS INTO THE LUNGS EVERY 6 HOURS AS NEEDED FOR WHEEZING OR SHORTNESS OF BREATH 6.7 g 0   atorvastatin (LIPITOR) 20 MG tablet Take 1 tablet (20 mg total) by mouth daily. 90 tablet 3   doxazosin (CARDURA) 2 MG tablet TAKE 1 TABLET BY MOUTH EVERY DAY 90 tablet 1   empagliflozin (JARDIANCE) 10 MG TABS tablet Take 1 tablet (10 mg total) by mouth daily before breakfast. 30 tablet 5   escitalopram (LEXAPRO) 10 MG tablet TAKE 1 TABLET BY MOUTH EVERY DAY 90 tablet 1   ezetimibe (ZETIA) 10 MG tablet TAKE 1 TABLET BY MOUTH DAILY. 90 tablet 3   fluticasone furoate-vilanterol (BREO ELLIPTA) 200-25 MCG/ACT AEPB INHALE 1  PUFF DAILY *NEED APPT* 60 each 11   furosemide (LASIX) 40 MG tablet Take 1 tablet (40 mg total) by mouth every other day. 45 tablet 3   lisinopril (ZESTRIL) 10 MG tablet TAKE 1 TABLET BY MOUTH EVERY DAY (Patient taking differently: Take 20 mg by mouth daily.) 90 tablet 0   loratadine (CLARITIN) 10 MG tablet Take 10 mg by mouth daily as needed for allergies or rhinitis.      meclizine (ANTIVERT) 12.5 MG tablet Take 1 tablet (12.5 mg total) by mouth 3 (three) times daily as needed for dizziness. 15 tablet 0   memantine (NAMENDA) 5 MG tablet TAKE 1 TABLET BY MOUTH EVERYDAY  AT BEDTIME 30 tablet 0   Multiple Vitamin (MULTIVITAMIN WITH MINERALS) TABS tablet Take 1 tablet by mouth daily.     No current facility-administered medications on file prior to visit.   Allergies  Allergen Reactions   Gabapentin Other (See Comments)    Visual changes and confusion- "I went blind while I was driving"   Social History   Socioeconomic History   Marital status: Married    Spouse name: [name]   Number of children: 1   Years of education: 20   Highest education level: Occupational hygienist History   Occupation: retired professor    Comment: History    Occupation: missionary  Tobacco Use   Smoking status: Never   Smokeless tobacco: Never  Scientific laboratory technician Use: Never used  Substance and Sexual Activity   Alcohol use: Not Currently   Drug use: Never   Sexual activity: Yes    Partners: Female  Other Topics Concern   Not on file  Social History Narrative   ** Merged History Encounter **       Lives with his wife (second marriage in 2015, first marriage ended when his wife died alzheimer's disease). Since his remarriage, his adult daughter doesn't speak with him.      Right handed   Social Determinants of Health   Financial Resource Strain: Low Risk  (02/09/2022)   Overall Financial Resource Strain (CARDIA)    Difficulty of Paying Living Expenses: Not hard at all  Food Insecurity: No Food Insecurity (02/09/2022)   Hunger Vital Sign    Worried About Running Out of Food in the Last Year: Never true    Ran Out of Food in the Last Year: Never true  Transportation Needs: Not on file  Physical Activity: Not on file  Stress: Not on file  Social Connections: Not on file  Intimate Partner Violence: Not on file   Family History  Problem Relation Age of Onset   Stomach cancer Mother    Memory loss Mother    Heart disease Father    Coronary artery disease Father 64   Heart attack Father    Heart disease Brother    Aortic aneurysm Brother    Colon cancer  Brother    Aortic aneurysm Brother    Colon cancer Brother    Arthritis Daughter        rheumatoid   Coronary artery disease Brother    Esophageal cancer Neg Hx    Rectal cancer Neg Hx      Review of Systems  Respiratory:  Positive for cough.   All other systems reviewed and are negative.      Objective:   Physical Exam Vitals reviewed.  Constitutional:      General: He is not in acute distress.    Appearance: He  is well-developed. He is not diaphoretic.  Neck:     Thyroid: No thyromegaly.     Vascular: No carotid bruit or JVD.     Trachea: No tracheal deviation.  Cardiovascular:     Rate and Rhythm: Normal rate and regular rhythm.     Heart sounds: Normal heart sounds. No murmur heard.    No friction rub. No gallop.  Pulmonary:     Effort: Pulmonary effort is normal. No accessory muscle usage, prolonged expiration or respiratory distress.     Breath sounds: No stridor. No wheezing or rales.  Abdominal:     General: Bowel sounds are normal.     Palpations: Abdomen is soft.  Musculoskeletal:     Cervical back: Neck supple. No erythema, rigidity, torticollis or crepitus. No pain with movement, spinous process tenderness or muscular tenderness. Normal range of motion.     Right lower leg: No edema.     Left lower leg: No edema.  Lymphadenopathy:     Cervical: No cervical adenopathy.  Neurological:     Mental Status: He is alert.     Motor: No abnormal muscle tone.     Deep Tendon Reflexes: Reflexes are normal and symmetric.           Assessment & Plan:  Stage 3b chronic kidney disease ([place]) - Plan: BASIC METABOLIC PANEL WITH GFR, Brain natriuretic peptide, BASIC METABOLIC PANEL WITH GFR, CBC with Differential/Platelet  Diastolic heart failure, unspecified HF chronicity (HCC)  Major neurocognitive disorder due to possible Alzheimer's disease, without behavioral disturbance  Essential hypertension Blood pressure is now well-controlled.  I will check a BMP,  CBC, and a BNP.  If renal function is stable we will continue lisinopril 20 mg a day in addition to his Jardiance.  Encouraged his wife to use Lasix only as needed for leg swelling or shortness of breath.  Patient is not currently on spironolactone which would maximize his therapy for heart failure with preserved EF.  Depending upon his potassium and renal function we may consider this.  However the patient states he feels better now than he has recently so I hesitate to make any additional changes at this time

## 2022-04-21 LAB — CBC WITH DIFFERENTIAL/PLATELET
Absolute Monocytes: 427 cells/uL (ref 200–950)
Basophils Absolute: 10 cells/uL (ref 0–200)
Basophils Relative: 0.2 %
Eosinophils Absolute: 187 cells/uL (ref 15–500)
Eosinophils Relative: 3.9 %
HCT: 34.1 % — ABNORMAL LOW (ref 38.5–50.0)
Hemoglobin: 11.5 g/dL — ABNORMAL LOW (ref 13.2–17.1)
Lymphs Abs: 715 cells/uL — ABNORMAL LOW (ref 850–3900)
MCH: 31.3 pg (ref 27.0–33.0)
MCHC: 33.7 g/dL (ref 32.0–36.0)
MCV: 92.7 fL (ref 80.0–100.0)
MPV: 9.5 fL (ref 7.5–12.5)
Monocytes Relative: 8.9 %
Neutro Abs: 3461 cells/uL (ref 1500–7800)
Neutrophils Relative %: 72.1 %
Platelets: 190 10*3/uL (ref 140–400)
RBC: 3.68 10*6/uL — ABNORMAL LOW (ref 4.20–5.80)
RDW: 12.2 % (ref 11.0–15.0)
Total Lymphocyte: 14.9 %
WBC: 4.8 10*3/uL (ref 3.8–10.8)

## 2022-04-21 LAB — BASIC METABOLIC PANEL WITH GFR
BUN/Creatinine Ratio: 22 (calc) (ref 6–22)
BUN: 46 mg/dL — ABNORMAL HIGH (ref 7–25)
CO2: 21 mmol/L (ref 20–32)
Calcium: 8.9 mg/dL (ref 8.6–10.3)
Chloride: 107 mmol/L (ref 98–110)
Creat: 2.12 mg/dL — ABNORMAL HIGH (ref 0.70–1.22)
Glucose, Bld: 123 mg/dL — ABNORMAL HIGH (ref 65–99)
Potassium: 4.8 mmol/L (ref 3.5–5.3)
Sodium: 137 mmol/L (ref 135–146)
eGFR: 30 mL/min/{1.73_m2} — ABNORMAL LOW (ref >=60)

## 2022-04-21 LAB — BRAIN NATRIURETIC PEPTIDE: Brain Natriuretic Peptide: 159 pg/mL — ABNORMAL HIGH (ref <100)

## 2022-04-23 ENCOUNTER — Telehealth: Payer: Self-pay | Admitting: Pharmacist

## 2022-04-23 NOTE — Progress Notes (Signed)
Care Management & Coordination Services Pharmacy Team  Reason for Encounter: Hypertension  Contacted patient to discuss hypertension disease state. Spoke with family on 04/23/2022     Current antihypertensive regimen:  Lisinopril 20 mg daily Doxazosin 2 mg daily  Patient verbally confirms he is taking the above medications as directed. Yes  How often are you checking your Blood Pressure? daily  Current home BP readings: 120's-130's/50's-60's.  Wrist or arm cuff: arm  Caffeine intake: drinks a 3 cups of coffee   Any readings above 180/100? No If yes any symptoms of hypertensive emergency? patient denies any symptoms of high blood pressure  What recent interventions/DTPs have been made by any provider to improve Blood Pressure control since last CPP Visit: Lisinopril has been increased to 20 mg daily  Any recent hospitalizations or ED visits since last visit with CPP? Yes  What diet changes have been made to improve Blood Pressure Control?  No recent diet changes.  What exercise is being done to improve your Blood Pressure Control?  None due to weakness  Adherence Review: Is the patient currently on ACE/ARB medication? Yes Does the patient have >5 day gap between last estimated fill dates? No  Star Rating Drugs:  Atorvastatin 20 mg last filled 02/16/2022 90 DS Jardiance 10 mg last filled 04/14/2022 30 DS Lisinopril 10 mg last filled 02/15/2022 90 DS   Chart Updates: Recent office visits:  04/20/2022 OV (PCP) [name], MD; Patient is not currently on spironolactone which would maximize his therapy for heart failure with preserved EF. Depending upon his potassium and renal function we may consider this. However the patient states he feels better now than he has recently so I hesitate to make any additional changes at this time   04/02/2022  OV (Fam Med) Nadara Mustard, [employer] S, FPN; no medication changes indicated.  Recent consult visits:  04/18/2022 OV (Podiatry) Acquanetta Sit, DPM; no medication changes indicated.  04/17/2022 OV (Neurology) Rondel Jumbo, PA-C; no medication changes indicated.  Hospital visits:  03/13/2022 ED visit for Weakness  Medications: Outpatient Encounter Medications as of 04/23/2022  Medication Sig   albuterol (PROAIR HFA) 108 (90 Base) MCG/ACT inhaler INHALE 2 PUFFS INTO THE LUNGS EVERY 6 HOURS AS NEEDED FOR WHEEZING OR SHORTNESS OF BREATH   atorvastatin (LIPITOR) 20 MG tablet Take 1 tablet (20 mg total) by mouth daily.   doxazosin (CARDURA) 2 MG tablet TAKE 1 TABLET BY MOUTH EVERY DAY   empagliflozin (JARDIANCE) 10 MG TABS tablet Take 1 tablet (10 mg total) by mouth daily before breakfast.   escitalopram (LEXAPRO) 10 MG tablet TAKE 1 TABLET BY MOUTH EVERY DAY   ezetimibe (ZETIA) 10 MG tablet TAKE 1 TABLET BY MOUTH DAILY.   fluticasone furoate-vilanterol (BREO ELLIPTA) 200-25 MCG/ACT AEPB INHALE 1 PUFF DAILY *NEED APPT*   furosemide (LASIX) 40 MG tablet Take 1 tablet (40 mg total) by mouth every other day.   lisinopril (ZESTRIL) 10 MG tablet TAKE 1 TABLET BY MOUTH EVERY DAY (Patient taking differently: Take 20 mg by mouth daily.)   loratadine (CLARITIN) 10 MG tablet Take 10 mg by mouth daily as needed for allergies or rhinitis.    meclizine (ANTIVERT) 12.5 MG tablet Take 1 tablet (12.5 mg total) by mouth 3 (three) times daily as needed for dizziness.   memantine (NAMENDA) 5 MG tablet TAKE 1 TABLET BY MOUTH EVERYDAY AT BEDTIME   Multiple Vitamin (MULTIVITAMIN WITH MINERALS) TABS tablet Take 1 tablet by mouth daily.   No facility-administered encounter  medications on file as of 04/23/2022.    Recent Office Vitals: BP Readings from Last 3 Encounters:  04/20/22 132/64  04/17/22 (!) 151/75  04/02/22 (!) 178/80   Pulse Readings from Last 3 Encounters:  04/20/22 69  04/17/22 85  04/02/22 80    Wt Readings from Last 3 Encounters:  04/20/22 200 lb 6.4 oz (90.9 kg)  04/17/22 201 lb 9.6 oz (91.4 kg)  04/02/22 201 lb (91.2 kg)      Kidney Function Lab Results  Component Value Date/Time   CREATININE 2.12 (H) 04/20/2022 12:11 PM   CREATININE 2.02 (H) 04/02/2022 09:40 AM   GFRNONAA 32 (L) 03/13/2022 07:13 AM   GFRNONAA 47 (L) 01/19/2020 04:21 PM   GFRAA 54 (L) 01/19/2020 04:21 PM       Latest Ref Rng & Units 04/20/2022   12:11 PM 04/02/2022    9:40 AM 03/13/2022    7:13 AM  BMP  Glucose 65 - 99 mg/dL 123  87  93   BUN 7 - 25 mg/dL 46  38  37   Creatinine 0.70 - 1.22 mg/dL 2.12  2.02  1.97   BUN/Creat Ratio 6 - 22 (calc) 22  19    Sodium 135 - 146 mmol/L 137  140  139   Potassium 3.5 - 5.3 mmol/L 4.8  5.3  4.5   Chloride 98 - 110 mmol/L 107  108  106   CO2 20 - 32 mmol/L 21  25  24    Calcium 8.6 - 10.3 mg/dL 8.9  8.9  9.4      Future Appointments  Date Time Provider [place]  05/24/2022  9:40 AM Sherren Mocha, MD [facility]  08/22/2022  9:15 AM Marzetta Board, DPM [facility]   April D Calhoun, [place] Pharmacist Assistant [phone]

## 2022-04-24 ENCOUNTER — Other Ambulatory Visit: Payer: Self-pay

## 2022-04-24 DIAGNOSIS — I10 Essential (primary) hypertension: Secondary | ICD-10-CM

## 2022-04-24 MED ORDER — LISINOPRIL 20 MG PO TABS: 20.0000 mg | ORAL_TABLET | Freq: Every day | ORAL | 3 refills | Status: DC

## 2022-04-27 ENCOUNTER — Encounter (HOSPITAL_BASED_OUTPATIENT_CLINIC_OR_DEPARTMENT_OTHER): Payer: Self-pay

## 2022-04-27 ENCOUNTER — Other Ambulatory Visit: Payer: Self-pay

## 2022-04-27 ENCOUNTER — Emergency Department (HOSPITAL_BASED_OUTPATIENT_CLINIC_OR_DEPARTMENT_OTHER): Payer: PPO

## 2022-04-27 ENCOUNTER — Inpatient Hospital Stay (HOSPITAL_BASED_OUTPATIENT_CLINIC_OR_DEPARTMENT_OTHER)
Admission: EM | Admit: 2022-04-27 | Discharge: 2022-05-02 | DRG: 309 | Disposition: A | Discharge status: Home / Self Care | Payer: PPO | Attending: Student | Admitting: Student

## 2022-04-27 DIAGNOSIS — T447X5A Adverse effect of beta-adrenoreceptor antagonists, initial encounter: Secondary | ICD-10-CM | POA: Diagnosis not present

## 2022-04-27 DIAGNOSIS — F02C3 Dementia in other diseases classified elsewhere, severe, with mood disturbance: Secondary | ICD-10-CM | POA: Diagnosis present

## 2022-04-27 DIAGNOSIS — G309 Alzheimer's disease, unspecified: Secondary | ICD-10-CM | POA: Diagnosis not present

## 2022-04-27 DIAGNOSIS — I480 Paroxysmal atrial fibrillation: Secondary | ICD-10-CM | POA: Diagnosis not present

## 2022-04-27 DIAGNOSIS — E1122 Type 2 diabetes mellitus with diabetic chronic kidney disease: Secondary | ICD-10-CM | POA: Diagnosis present

## 2022-04-27 DIAGNOSIS — D500 Iron deficiency anemia secondary to blood loss (chronic): Secondary | ICD-10-CM | POA: Diagnosis not present

## 2022-04-27 DIAGNOSIS — Z888 Allergy status to other drugs, medicaments and biological substances status: Secondary | ICD-10-CM | POA: Diagnosis not present

## 2022-04-27 DIAGNOSIS — Z6830 Body mass index (BMI) 30.0-30.9, adult: Secondary | ICD-10-CM

## 2022-04-27 DIAGNOSIS — I5032 Chronic diastolic (congestive) heart failure: Secondary | ICD-10-CM | POA: Diagnosis not present

## 2022-04-27 DIAGNOSIS — I959 Hypotension, unspecified: Secondary | ICD-10-CM | POA: Diagnosis not present

## 2022-04-27 DIAGNOSIS — Z7984 Long term (current) use of oral hypoglycemic drugs: Secondary | ICD-10-CM

## 2022-04-27 DIAGNOSIS — N1832 Chronic kidney disease, stage 3b: Secondary | ICD-10-CM | POA: Diagnosis present

## 2022-04-27 DIAGNOSIS — F02C11 Dementia in other diseases classified elsewhere, severe, with agitation: Secondary | ICD-10-CM | POA: Diagnosis not present

## 2022-04-27 DIAGNOSIS — E785 Hyperlipidemia, unspecified: Secondary | ICD-10-CM | POA: Diagnosis not present

## 2022-04-27 DIAGNOSIS — F05 Delirium due to known physiological condition: Secondary | ICD-10-CM | POA: Diagnosis not present

## 2022-04-27 DIAGNOSIS — I130 Hypertensive heart and chronic kidney disease with heart failure and stage 1 through stage 4 chronic kidney disease, or unspecified chronic kidney disease: Secondary | ICD-10-CM | POA: Diagnosis not present

## 2022-04-27 DIAGNOSIS — R7989 Other specified abnormal findings of blood chemistry: Secondary | ICD-10-CM | POA: Diagnosis not present

## 2022-04-27 DIAGNOSIS — I700 Atherosclerosis of aorta: Secondary | ICD-10-CM | POA: Diagnosis not present

## 2022-04-27 DIAGNOSIS — I252 Old myocardial infarction: Secondary | ICD-10-CM

## 2022-04-27 DIAGNOSIS — I25118 Atherosclerotic heart disease of native coronary artery with other forms of angina pectoris: Secondary | ICD-10-CM | POA: Diagnosis present

## 2022-04-27 DIAGNOSIS — I2584 Coronary atherosclerosis due to calcified coronary lesion: Secondary | ICD-10-CM | POA: Diagnosis not present

## 2022-04-27 DIAGNOSIS — R319 Hematuria, unspecified: Secondary | ICD-10-CM | POA: Diagnosis present

## 2022-04-27 DIAGNOSIS — I10 Essential (primary) hypertension: Secondary | ICD-10-CM | POA: Diagnosis not present

## 2022-04-27 DIAGNOSIS — R079 Chest pain, unspecified: Principal | ICD-10-CM

## 2022-04-27 DIAGNOSIS — Z79899 Other long term (current) drug therapy: Secondary | ICD-10-CM

## 2022-04-27 DIAGNOSIS — Z9181 History of falling: Secondary | ICD-10-CM

## 2022-04-27 DIAGNOSIS — F02C4 Dementia in other diseases classified elsewhere, severe, with anxiety: Secondary | ICD-10-CM | POA: Diagnosis present

## 2022-04-27 DIAGNOSIS — Z800 Family history of malignant neoplasm of digestive organs: Secondary | ICD-10-CM

## 2022-04-27 DIAGNOSIS — R55 Syncope and collapse: Secondary | ICD-10-CM | POA: Diagnosis present

## 2022-04-27 DIAGNOSIS — I4891 Unspecified atrial fibrillation: Secondary | ICD-10-CM | POA: Diagnosis present

## 2022-04-27 DIAGNOSIS — E669 Obesity, unspecified: Secondary | ICD-10-CM | POA: Diagnosis not present

## 2022-04-27 DIAGNOSIS — Z8249 Family history of ischemic heart disease and other diseases of the circulatory system: Secondary | ICD-10-CM

## 2022-04-27 DIAGNOSIS — R0789 Other chest pain: Secondary | ICD-10-CM | POA: Diagnosis not present

## 2022-04-27 DIAGNOSIS — R296 Repeated falls: Secondary | ICD-10-CM | POA: Diagnosis present

## 2022-04-27 DIAGNOSIS — S066XAS Traumatic subarachnoid hemorrhage with loss of consciousness status unknown, sequela: Secondary | ICD-10-CM

## 2022-04-27 DIAGNOSIS — J4540 Moderate persistent asthma, uncomplicated: Secondary | ICD-10-CM | POA: Diagnosis not present

## 2022-04-27 DIAGNOSIS — I609 Nontraumatic subarachnoid hemorrhage, unspecified: Secondary | ICD-10-CM | POA: Diagnosis not present

## 2022-04-27 DIAGNOSIS — N400 Enlarged prostate without lower urinary tract symptoms: Secondary | ICD-10-CM | POA: Diagnosis not present

## 2022-04-27 DIAGNOSIS — I2489 Other forms of acute ischemic heart disease: Secondary | ICD-10-CM | POA: Diagnosis not present

## 2022-04-27 DIAGNOSIS — T45515A Adverse effect of anticoagulants, initial encounter: Secondary | ICD-10-CM | POA: Diagnosis present

## 2022-04-27 DIAGNOSIS — I2510 Atherosclerotic heart disease of native coronary artery without angina pectoris: Secondary | ICD-10-CM | POA: Diagnosis not present

## 2022-04-27 DIAGNOSIS — K869 Disease of pancreas, unspecified: Secondary | ICD-10-CM | POA: Diagnosis not present

## 2022-04-27 DIAGNOSIS — F32A Depression, unspecified: Secondary | ICD-10-CM | POA: Diagnosis not present

## 2022-04-27 DIAGNOSIS — Z7951 Long term (current) use of inhaled steroids: Secondary | ICD-10-CM

## 2022-04-27 DIAGNOSIS — Z955 Presence of coronary angioplasty implant and graft: Secondary | ICD-10-CM

## 2022-04-27 DIAGNOSIS — J90 Pleural effusion, not elsewhere classified: Secondary | ICD-10-CM | POA: Diagnosis not present

## 2022-04-27 LAB — TROPONIN I (HIGH SENSITIVITY)
Troponin I (High Sensitivity): 30 ng/L — ABNORMAL HIGH (ref <18)
Troponin I (High Sensitivity): 32 ng/L — ABNORMAL HIGH (ref <18)

## 2022-04-27 LAB — BRAIN NATRIURETIC PEPTIDE: B Natriuretic Peptide: 316.9 pg/mL — ABNORMAL HIGH (ref 0.0–100.0)

## 2022-04-27 LAB — CBC
HCT: 32.2 % — ABNORMAL LOW (ref 39.0–52.0)
Hemoglobin: 10.5 g/dL — ABNORMAL LOW (ref 13.0–17.0)
MCH: 31.1 pg (ref 26.0–34.0)
MCHC: 32.6 g/dL (ref 30.0–36.0)
MCV: 95.3 fL (ref 80.0–100.0)
Platelets: 161 10*3/uL (ref 150–400)
RBC: 3.38 MIL/uL — ABNORMAL LOW (ref 4.22–5.81)
RDW: 13.2 % (ref 11.5–15.5)
WBC: 5.5 10*3/uL (ref 4.0–10.5)
nRBC: 0 % (ref 0.0–0.2)

## 2022-04-27 LAB — BASIC METABOLIC PANEL
Anion gap: 8 (ref 5–15)
BUN: 43 mg/dL — ABNORMAL HIGH (ref 8–23)
CO2: 22 mmol/L (ref 22–32)
Calcium: 8.9 mg/dL (ref 8.9–10.3)
Chloride: 106 mmol/L (ref 98–111)
Creatinine, Ser: 1.97 mg/dL — ABNORMAL HIGH (ref 0.61–1.24)
GFR, Estimated: 32 mL/min — ABNORMAL LOW (ref >60)
Glucose, Bld: 86 mg/dL (ref 70–99)
Potassium: 4.7 mmol/L (ref 3.5–5.1)
Sodium: 136 mmol/L (ref 135–145)

## 2022-04-27 LAB — URINALYSIS, ROUTINE W REFLEX MICROSCOPIC
Bacteria, UA: NONE SEEN
Bilirubin Urine: NEGATIVE
Glucose, UA: 500 mg/dL — AB
Ketones, ur: NEGATIVE mg/dL
Leukocytes,Ua: NEGATIVE
Nitrite: NEGATIVE
RBC / HPF: >50 RBC/hpf (ref 0–5)
Specific Gravity, Urine: 1.017 (ref 1.005–1.030)
pH: 5.5 (ref 5.0–8.0)

## 2022-04-27 LAB — HEPATIC FUNCTION PANEL
ALT: 14 U/L (ref 0–44)
AST: 24 U/L (ref 15–41)
Albumin: 3.7 g/dL (ref 3.5–5.0)
Alkaline Phosphatase: 70 U/L (ref 38–126)
Bilirubin, Direct: <0.1 mg/dL (ref 0.0–0.2)
Total Bilirubin: 0.6 mg/dL (ref 0.3–1.2)
Total Protein: 6.6 g/dL (ref 6.5–8.1)

## 2022-04-27 MED ORDER — DILTIAZEM HCL-DEXTROSE 125-5 MG/125ML-% IV SOLN (PREMIX)
5.0000 mg/h | INTRAVENOUS | Status: DC
  Administered 2022-04-27: 5 mg/h via INTRAVENOUS
  Filled 2022-04-27: qty 125

## 2022-04-27 MED ORDER — METOPROLOL TARTRATE 12.5 MG HALF TABLET
12.5000 mg | ORAL_TABLET | Freq: Two times a day (BID) | ORAL | Status: DC
  Administered 2022-04-27 – 2022-04-30 (×6): 12.5 mg via ORAL
  Filled 2022-04-27 (×6): qty 1

## 2022-04-27 MED ORDER — ENOXAPARIN SODIUM 30 MG/0.3ML IJ SOSY
30.0000 mg | PREFILLED_SYRINGE | INTRAMUSCULAR | Status: DC
  Administered 2022-04-28 – 2022-05-02 (×5): 30 mg via SUBCUTANEOUS
  Filled 2022-04-27 (×5): qty 0.3

## 2022-04-27 MED ORDER — IOHEXOL 350 MG/ML SOLN
100.0000 mL | Freq: Once | INTRAVENOUS | Status: AC | PRN
  Administered 2022-04-27: 80 mL via INTRAVENOUS

## 2022-04-27 NOTE — ED Notes (Signed)
Pt's heart monitor alarming as tachy in 140-150's. Went to assess pt, and rhythm appears to be afib RVR, with pt endorsing chest pressure. EKG captured and given to Dr. Sherry Ruffing. Notified of pt's new chest pressure.

## 2022-04-27 NOTE — ED Notes (Signed)
Attempted to call to give report to the IP RN. Nurse was busy with Pt care and unable to get to the phone. Will attempt a phone call back before [facility] arrives at [facility]. [facility] is on the way with Pt to facility.

## 2022-04-27 NOTE — Progress Notes (Signed)
Hospitalist Transfer Note:  Transferring facility: [facility] Requesting provider: Dr. Gustavus Messing (EDP at [facility]) Reason for transfer: admission for further evaluation and management of atrial fibrillation with RVR.     89 y.o.  male, with h/o chf, CAD status post PCI with stent placement, who presented to [facility] ED complaining of stabbing chest pain over the last day, and was found to be in atrial fibrillation with RVR, with heart rates into the 140s to 150s, which is reported to be new for him.  EKG reportedly shows atrial fibrillation with RVR, without overt evidence of acute ischemic changes.  Troponin reportedly very mildly elevated into the 30s: First troponin 30, with repeat up slightly to 32, relative to troponin values in the high 20s in February 2024.   CTA chest showed no evidence of dissection or any evidence of acute pulmonary embolism, showing a very small pericardial effusion, without any radiographic evidence of tamponade physiology.  Was started on diltiazem drip.  Vital signs in the ED were notable for the following: Systolic blood pressures in the 120s to 160s.  EDP discussed patient's case with the on-call cardiology fellow, who conveyed that cardiology will formally consult and see the patient in the morning.    Subsequently, I accepted this patient for transfer for inpatient admission to a cardiac telemetry bed at [facility] for further work-up and management of the above .       Nursing staff, Please call [place] number on Amion [phone]) as soon as patient's arrival, so appropriate admitting provider can evaluate the pt.     Babs Bertin, DO Hospitalist

## 2022-04-27 NOTE — ED Provider Notes (Signed)
[facility] Provider Note   CSN: [id number] Arrival date & time: 04/27/22  1742     History  Chief Complaint  Patient presents with   Chest Pain    BEECHER [name] is a 89 y.o. male.  The history is provided by the patient, medical records and a relative. No language interpreter was used.  Chest Pain Pain location:  Substernal area Pain quality: sharp and stabbing   Pain radiates to:  Upper back and mid back Pain severity:  Severe Onset quality:  Sudden Timing:  Constant Progression:  Waxing and waning Chronicity:  New Relieved by:  Nothing Worsened by:  Nothing Ineffective treatments:  None tried Associated symptoms: back pain   Associated symptoms: no abdominal pain, no cough, no diaphoresis, no fatigue, no fever, no headache, no lower extremity edema, no nausea, no near-syncope, no numbness, no palpitations, no shortness of breath, no vomiting and no weakness   Risk factors: coronary artery disease        Home Medications Prior to Admission medications   Medication Sig Start Date End Date Taking? Authorizing Provider  lisinopril (ZESTRIL) 20 MG tablet Take 1 tablet (20 mg total) by mouth daily. 04/24/22   [name], MD  albuterol [facility] HFA) [phone] Base) MCG/ACT inhaler INHALE 2 PUFFS INTO THE LUNGS EVERY 6 HOURS AS NEEDED FOR WHEEZING OR SHORTNESS OF BREATH 08/06/19   Angiulli, Lavon Paganini, PA-C  atorvastatin (LIPITOR) 20 MG tablet Take 1 tablet (20 mg total) by mouth daily. 08/17/21   [name], MD  doxazosin (CARDURA) 2 MG tablet TAKE 1 TABLET BY MOUTH EVERY DAY 01/18/22   [name], MD  empagliflozin (JARDIANCE) 10 MG TABS tablet Take 1 tablet (10 mg total) by mouth daily before breakfast. 01/16/22   [name], MD  escitalopram (LEXAPRO) 10 MG tablet TAKE 1 TABLET BY MOUTH EVERY DAY 01/19/22   [name], MD  ezetimibe (ZETIA) 10 MG tablet TAKE 1 TABLET BY MOUTH DAILY. 05/24/21    Sherren Mocha, MD  fluticasone furoate-vilanterol (BREO ELLIPTA) 200-25 MCG/ACT AEPB INHALE 1 PUFF DAILY *NEED APPT* 01/16/22   [name], MD  furosemide (LASIX) 40 MG tablet Take 1 tablet (40 mg total) by mouth every other day. 12/25/21   Sherren Mocha, MD  loratadine (CLARITIN) 10 MG tablet Take 10 mg by mouth daily as needed for allergies or rhinitis.     [provider]  meclizine (ANTIVERT) 12.5 MG tablet Take 1 tablet (12.5 mg total) by mouth 3 (three) times daily as needed for dizziness. 09/28/20   Lajean Saver, MD  memantine (NAMENDA) 5 MG tablet TAKE 1 TABLET BY MOUTH EVERYDAY AT BEDTIME 04/09/22   Rondel Jumbo, PA-C  Multiple Vitamin (MULTIVITAMIN WITH MINERALS) TABS tablet Take 1 tablet by mouth daily.    [provider]      Allergies    Gabapentin    Review of Systems   Review of Systems  Constitutional:  Negative for chills, diaphoresis, fatigue and fever.  HENT:  Negative for congestion.   Respiratory:  Negative for cough, chest tightness, shortness of breath and wheezing.   Cardiovascular:  Positive for chest pain and leg swelling (chronic per pt). Negative for palpitations and near-syncope.  Gastrointestinal:  Negative for abdominal pain, constipation, diarrhea, nausea and vomiting.  Genitourinary:  Negative for dysuria and flank pain.  Musculoskeletal:  Positive for back pain. Negative for neck pain and neck stiffness.  Skin:  Negative for rash and wound.  Neurological:  Negative for seizures, weakness, light-headedness, numbness and headaches.  Psychiatric/Behavioral:  Negative for agitation and confusion.     Physical Exam Updated Vital Signs BP (!) 186/74 (BP Location: Left Arm)   Pulse 70   Temp 98.2 F (36.8 C) (Oral)   Resp 18   Ht 5\' 8"  (1.727 m)   Wt 90.7 kg   SpO2 98%   BMI 30.41 kg/m  Physical Exam Vitals and nursing note reviewed.  Constitutional:      General: He is not in acute distress.    Appearance: He is  well-developed. He is not ill-appearing, toxic-appearing or diaphoretic.  HENT:     Head: Normocephalic and atraumatic.  Eyes:     Extraocular Movements: Extraocular movements intact.     Conjunctiva/sclera: Conjunctivae normal.     Pupils: Pupils are equal, round, and reactive to light.  Cardiovascular:     Rate and Rhythm: Normal rate and regular rhythm.     Heart sounds: Normal heart sounds. No murmur heard. Pulmonary:     Effort: Pulmonary effort is normal. No respiratory distress.     Breath sounds: Normal breath sounds. No wheezing, rhonchi or rales.  Chest:     Chest wall: No tenderness.  Abdominal:     Palpations: Abdomen is soft.     Tenderness: There is no abdominal tenderness.  Musculoskeletal:        General: No swelling.     Cervical back: Neck supple.     Right lower leg: No tenderness. Edema present.     Left lower leg: No tenderness. Edema present.  Skin:    General: Skin is warm and dry.     Capillary Refill: Capillary refill takes less than 2 seconds.     Findings: No erythema or rash.  Neurological:     Mental Status: He is alert.  Psychiatric:        Mood and Affect: Mood normal.     ED Results / Procedures / Treatments   Labs (all labs ordered are listed, but only abnormal results are displayed) Labs Reviewed  BASIC METABOLIC PANEL - Abnormal; Notable for the following components:      Result Value   BUN 43 (*)    Creatinine, Ser 1.97 (*)    GFR, Estimated 32 (*)    All other components within normal limits  CBC - Abnormal; Notable for the following components:   RBC 3.38 (*)    Hemoglobin 10.5 (*)    HCT 32.2 (*)    All other components within normal limits  BRAIN NATRIURETIC PEPTIDE - Abnormal; Notable for the following components:   B Natriuretic Peptide 316.9 (*)    All other components within normal limits  URINALYSIS, ROUTINE W REFLEX MICROSCOPIC - Abnormal; Notable for the following components:   Color, Urine COLORLESS (*)    Glucose,  UA 500 (*)    Hgb urine dipstick LARGE (*)    Protein, ur TRACE (*)    All other components within normal limits  TROPONIN I (HIGH SENSITIVITY) - Abnormal; Notable for the following components:   Troponin I (High Sensitivity) 30 (*)    All other components within normal limits  TROPONIN I (HIGH SENSITIVITY) - Abnormal; Notable for the following components:   Troponin I (High Sensitivity) 32 (*)    All other components within normal limits  HEPATIC FUNCTION PANEL    EKG EKG Interpretation  Date/Time:  Friday April 27 2022 17:58:00 EDT  Ventricular Rate:  73 PR Interval:  158 QRS Duration: 110 QT Interval:  386 QTC Calculation: 426 R Axis:   -17 Text Interpretation: Sinus rhythm Borderline left axis deviation when compared to prior, similar appearance. No STEMI Confirmed by Antony Blackbird [phone]) on 04/27/2022 6:03:45 PM  Radiology CT Angio Chest/Abd/Pel for Dissection W and/or Wo Contrast  Result Date: 04/27/2022 CLINICAL DATA:  Acute aortic syndrome suspected. EXAM: CT ANGIOGRAPHY CHEST, ABDOMEN AND PELVIS TECHNIQUE: Non-contrast CT of the chest was initially obtained. Multidetector CT imaging through the chest, abdomen and pelvis was performed using the standard protocol during bolus administration of intravenous contrast. Multiplanar reconstructed images and MIPs were obtained and reviewed to evaluate the vascular anatomy. RADIATION DOSE REDUCTION: This exam was performed according to the departmental dose-optimization program which includes automated exposure control, adjustment of the mA and/or kV according to patient size and/or use of iterative reconstruction technique. CONTRAST:  34mL OMNIPAQUE IOHEXOL 350 MG/ML SOLN COMPARISON:  CT abdomen pelvis dated 08/30/2021. FINDINGS: CTA CHEST FINDINGS Cardiovascular: There is no cardiomegaly. Small pericardial effusion measuring 7 mm in thickness. Lose three-vessel coronary vascular calcification. Moderate atherosclerotic calcification of  the thoracic aorta. No aneurysmal dilatation or dissection. No pulmonary artery embolus identified. Mediastinum/Nodes: No hilar or mediastinal adenopathy. The esophagus is grossly unremarkable. There is a 1 cm right thyroid hypodense nodule. Not clinically significant; no follow-up imaging recommended (ref: J Am Coll Radiol. 2015 Feb;12(2): 143-50).No mediastinal fluid collection. Lungs/Pleura: Small left pleural effusion. There is left lung base atelectasis/scarring. Pneumonia is not excluded. A 3.5 x 1.5 cm ovoid density in the lingula, likely scarring or rounded atelectasis. This was present on the prior chest CT of 2022. There is a 3 mm stable right middle lobe nodule. No pneumothorax. The central airways are patent. Musculoskeletal: Osteopenia with degenerative changes of the spine. Old left rib fractures. No acute osseous pathology. Review of the MIP images confirms the above findings. CTA ABDOMEN AND PELVIS FINDINGS VASCULAR Aorta: Advanced atherosclerotic calcification the abdominal aorta. There is a 2.8 cm infrarenal aortic ectasia. No aneurysmal dilatation or dissection. No periaortic fluid collection. Celiac: Patent without evidence of aneurysm, dissection, vasculitis or significant stenosis. SMA: Atherosclerotic calcification of the SMA. The SMA remains patent Renals: The renal arteries are patent. IMA: Minimal flow in the IMA. Inflow: There is atherosclerotic calcification of the iliac arteries. 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 The iliac arteries remain patent. Veins: No obvious venous abnormality within the limitations of this arterial phase study. Review of the MIP images confirms the above findings. NON-VASCULAR No intra-abdominal free air or free fluid. Hepatobiliary: The liver is unremarkable. No biliary ductal dilatation. The gallbladder is unremarkable. Pancreas: There is a 2.8 x 1.7 cm hypoenhancing lesion in the distal body and tail of  the pancreas which is not characterized this CT but has increased in size since 2023. No significant metabolic activity was seen on the prior PET suggestive of a possible IPMN. Further characterization with MRI, if not previously performed, is advised. No active inflammatory changes. Spleen: Normal in size without focal abnormality. Adrenals/Urinary Tract: The adrenal glands are unremarkable. Mild bilateral renal parenchyma atrophy. There is no hydronephrosis on either side. The visualized ureters and urinary bladder appear unremarkable. Stomach/Bowel: There is sigmoid diverticulosis without active inflammatory changes. Moderate stool throughout the colon. No bowel obstruction or active inflammation. The appendix is not visualized with certainty. No inflammatory changes identified in the right lower quadrant. Lymphatic:  No adenopathy. Reproductive: Mildly enlarged prostate gland with median lobe hypertrophy. Other: None Musculoskeletal: Osteopenia with degenerative changes of the spine. No acute osseous pathology. Review of the MIP images confirms the above findings. IMPRESSION: 1. No aortic dissection or aneurysm. 2. Small left pleural effusion with left lung base atelectasis/scarring. Pneumonia is not excluded. 3. Sigmoid diverticulosis without active inflammatory changes. No bowel obstruction. 4. Interval increase in size of the hypoenhancing lesion in the distal body and tail of the pancreas. Further characterization with MRI, if not previously performed, is advised. Electronically Signed   By: Anner Crete M.D.   On: 04/27/2022 20:06   DG Chest Port 1 View  Result Date: 04/27/2022 CLINICAL DATA:  Chest pain EXAM: PORTABLE CHEST 1 VIEW COMPARISON:  03/13/2022 x-ray and older FINDINGS: Small left effusion versus pleural thickening with some nodularity and opacity, similar to previous. No pneumothorax or edema. Enlarged cardiopericardial silhouette. Calcified aorta. Overlapping cardiac leads. Degenerative  changes seen of the spine and shoulders. IMPRESSION: Enlarged heart. Persistent left effusion, adjacent opacity and nodularity. Appearance is similar to previous. Please correlate with previous workup. Electronically Signed   By: Jill Side M.D.   On: 04/27/2022 18:49    Procedures Procedures    CRITICAL CARE Performed by: Gwenyth Allegra Sayra Frisby Total critical care time: 35 minutes Critical care time was exclusive of separately billable procedures and treating other patients. Critical care was necessary to treat or prevent imminent or life-threatening deterioration. Critical care was time spent personally by me on the following activities: development of treatment plan with patient and/or surrogate as well as nursing, discussions with consultants, evaluation of patient's response to treatment, examination of patient, obtaining history from patient or surrogate, ordering and performing treatments and interventions, ordering and review of laboratory studies, ordering and review of radiographic studies, pulse oximetry and re-evaluation of patient's condition.  Medications Ordered in ED Medications  diltiazem (CARDIZEM) 125 mg in dextrose 5% 125 mL (1 mg/mL) infusion (5 mg/hr Intravenous New Bag/Given 04/27/22 2113)  iohexol (OMNIPAQUE) 350 MG/ML injection 100 mL (80 mLs Intravenous Contrast Given 04/27/22 1940)    ED Course/ Medical Decision Making/ A&P                             Medical Decision Making Amount and/or Complexity of Data Reviewed Labs: ordered. Radiology: ordered.  Risk Prescription drug management. Decision regarding hospitalization.    [name] is a 89 y.o. male with a past medical history significant for hyperlipidemia, CKD, CAD with PCI, subarachnoid hemorrhage, BPH, and previous pleural effusion and thoracentesis who presents with sudden severe pain in his chest and back.  According to patient, he was watching TV this afternoon when he had sudden onset of  severe, 8 out of 10 pain in his chest and back that was a stabbing knifelike pain through his torso.  He reports it may have gone to his shoulder but he is not remember.  He denies shortness of breath, diaphoresis, nausea, or vomiting.  Denies any trauma.  Denies any fevers, chills, congestion, or cough.  Denies any constipation diarrhea or urinary symptoms.  Denies any leg pain.  He is unsure if he had this pain before.  He denies any symptoms over the last few days and is otherwise feeling well.  He reports it has been waxing and waning.  He denies history of aortic disease to his knowledge but has had fluid troubles.  He does say his  legs are chronically edematous and it does not seem different.  On exam, lungs were clear.  Chest nontender.  No murmur on initial exam.  Abdomen nontender.  Good pulses in extremities.  Patient does have edema in both legs.  No focal neurologic deficits initially.  Patient was pain-free initially but when he sat up and I started doing his exam he reported having some discomfort.  This may indicate a musculoskeletal or torso wall etiology of symptoms but will wait for workup.  EKG did not show STEMI.  Given the patient's report of sharp stabbing pain in his chest and back with his age, I am concerning to rule out an aortic etiology such as aneurysm or dissection.  Will get a dissection study.  Will get a BNP with the edema but will also get screening labs and cardiac workup.  Anticipate reassessment after workup to determine disposition.  CT scan returned showing lesion persistent but he denies any cough or symptoms suggestive of pneumonia.  Shows a small pericardial effusion.  When patient returned back from [place], he was having crushing chest pain and now appears to be in A-fib with RVR.  A vagal maneuver was tried but it was unsuccessful.  Does not appear to show SVT and appears to show more of a fib/flutter pattern with RVR.  I called cardiology who reviewed  his workup and ultrasound previously.  They feel he is safe to start diltiazem drip and recommends he get admitted to medicine at [facility] so he can get an echo, get seen by cardiology, and monitor him.  Patient agrees with plan of care.         Final Clinical Impression(s) / ED Diagnoses Final diagnoses:  Chest pain, unspecified type  Atrial fibrillation with RVR (HCC)     Clinical Impression: 1. Chest pain, unspecified type   2. Atrial fibrillation with RVR ([place])     Disposition: Admit  This note was prepared with assistance of Dragon voice recognition software. Occasional wrong-word or sound-a-like substitutions may have occurred due to the inherent limitations of voice recognition software.     Sameen Leas, Gwenyth Allegra, MD 04/27/22 [phone]

## 2022-04-27 NOTE — ED Triage Notes (Signed)
Pt to er, pt states that he is here for chest pain, states that his pain started about an hour ago, states that he was watching tv when the pain started.  States that deep breathing makes the pain worse.  States that he also has back pain, states that his pain started with the back pain.

## 2022-04-27 NOTE — H&P (Signed)
History and Physical  [name] [id number] DOB: [birth year] DOA: 04/27/2022  Referring physician: Admitted by Dr. Eugenia Pancoast, [facility], hospitalist service. PCP: [name], MD  Outpatient Specialists: Cardiology. Patient coming from: Home through [facility] ED.  Chief Complaint: Chest pain  HPI: [name] is a 89 y.o. male with medical history significant for coronary artery disease status post PCI with stent placement, HFpEF 55 to 60% with grade 1 diastolic dysfunction, who initially presented to [facility] ED with complaints of chest pain.  Centrally located, sharp, non radiating, worse with deep breath and present at rest.  Associated with shortness of breath with minimal exertion.  While in the ED, lab studies are notable for elevated high-sensitivity troponin 30, uptrended to 32, flat.  EDP discussed the case with cardiology on-call who recommended admission to hospitalist service for further workup.  The patient was admitted by [facility].  Upon arrival to [facility] telemetry cardiac unit, the patient complaints of persistent sharp centrally located chest pain.  In Afib with RVR on diltiazem drip.  Po Lopressor added with some improvement of the heart rate.  Denies palpitations even when in Afib rvr.  Cardiology, Dr. Grayce Sessions, made aware of his persistent chest pain and will provide further recommendations.  ED Course: Tmax 98.3.  BP 96/57, pulse 89, respiratory 16, O2 saturation 97% on room air.  Lab studies remarkable for first set troponin 30, repeat 32.  BUN 43, creatinine 1.97, GFR 32, BNP 316.    Review of Systems: Review of systems as noted in the HPI. All other systems reviewed and are negative.   Past Medical History:  Diagnosis Date   Acute blood loss anemia    Acute upper respiratory infections of unspecified site 11/04/2012   AKI (acute kidney injury)    Benign prostatic hyperplasia    CAD (coronary artery disease)    s/p cypher DES to pLAD 6/08; normal LVF;   ETT-Myoview 2009: no ischemia    Clavicle fracture 08/10/2019   Closed displaced fracture of phalanx of left thumb, sequela 08/10/2019   Coronary atherosclerosis of native coronary artery 03/08/2008   Dizziness 03/08/2020   Eosinophilia    Essential hypertension    Fracture of multiple ribs with pain 08/10/2019   Hematoma 11/04/2012   History of multiple falls    History of SAH (subarachnoid hemorrhage) 07/14/2019   Hyperlipidemia type IIB / III 03/08/2008   Itching of ear 07/14/2021   Major neurocognitive disorder due to possible Alzheimer's disease, without behavioral disturbance 02/29/2020   MI (myocardial infarction)    Moderate persistent asthma    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   Imaging revealed [facility]   Multiple trauma    Nocturnal leg cramps 11/23/2012   Orthostatic hypotension 03/08/2008   Otalgia of left ear 05/11/2021   Otorrhagia of left ear 03/30/2021   Pain in joint, shoulder region 11/23/2012   Stage 3b chronic kidney disease    Thrombocytopenia    Trigeminal neuralgia    Type 2 diabetes mellitus    Past Surgical History:  Procedure Laterality Date   APPENDECTOMY     CARDIAC CATHETERIZATION  07/30/2006   CORONARY ANGIOPLASTY WITH STENT PLACEMENT   CARDIAC CATHETERIZATION     CATARACT EXTRACTION Right 03/2022   CATARACT EXTRACTION Left 04/2022   EXPLORATORY LAPAROTOMY     age 45   EXPLORATORY LAPAROTOMY     IR THORACENTESIS ASP PLEURAL SPACE W/IMG GUIDE  10/05/2019    Social History:  reports that he  has never smoked. He has never used smokeless tobacco. He reports that he does not currently use alcohol. He reports that he does not use drugs.   Allergies  Allergen Reactions   Gabapentin Other (See Comments)    Visual changes and confusion- "I went blind while I was driving"    Family History  Problem Relation Age of Onset   Stomach cancer Mother    Memory loss Mother    Heart disease Father    Coronary artery disease  Father 40   Heart attack Father    Heart disease Brother    Aortic aneurysm Brother    Colon cancer Brother    Aortic aneurysm Brother    Colon cancer Brother    Arthritis Daughter        rheumatoid   Coronary artery disease Brother    Esophageal cancer Neg Hx    Rectal cancer Neg Hx       Prior to Admission medications   Medication Sig Start Date End Date Taking? Authorizing Provider  lisinopril (ZESTRIL) 20 MG tablet Take 1 tablet (20 mg total) by mouth daily. 04/24/22   [name], MD  albuterol [facility] HFA) [phone] Base) MCG/ACT inhaler INHALE 2 PUFFS INTO THE LUNGS EVERY 6 HOURS AS NEEDED FOR WHEEZING OR SHORTNESS OF BREATH 08/06/19   Angiulli, Lavon Paganini, PA-C  atorvastatin (LIPITOR) 20 MG tablet Take 1 tablet (20 mg total) by mouth daily. 08/17/21   [name], MD  doxazosin (CARDURA) 2 MG tablet TAKE 1 TABLET BY MOUTH EVERY DAY 01/18/22   [name], MD  empagliflozin (JARDIANCE) 10 MG TABS tablet Take 1 tablet (10 mg total) by mouth daily before breakfast. 01/16/22   [name], MD  escitalopram (LEXAPRO) 10 MG tablet TAKE 1 TABLET BY MOUTH EVERY DAY 01/19/22   [name], MD  ezetimibe (ZETIA) 10 MG tablet TAKE 1 TABLET BY MOUTH DAILY. 05/24/21   Sherren Mocha, MD  fluticasone furoate-vilanterol (BREO ELLIPTA) 200-25 MCG/ACT AEPB INHALE 1 PUFF DAILY *NEED APPT* 01/16/22   [name], MD  furosemide (LASIX) 40 MG tablet Take 1 tablet (40 mg total) by mouth every other day. 12/25/21   Sherren Mocha, MD  loratadine (CLARITIN) 10 MG tablet Take 10 mg by mouth daily as needed for allergies or rhinitis.     [provider]  meclizine (ANTIVERT) 12.5 MG tablet Take 1 tablet (12.5 mg total) by mouth 3 (three) times daily as needed for dizziness. 09/28/20   Lajean Saver, MD  memantine (NAMENDA) 5 MG tablet TAKE 1 TABLET BY MOUTH EVERYDAY AT BEDTIME 04/09/22   Rondel Jumbo, PA-C  Multiple Vitamin (MULTIVITAMIN WITH MINERALS) TABS tablet  Take 1 tablet by mouth daily.    [provider]    Physical Exam: BP (!) 129/96 (BP Location: Left Arm)   Pulse 63   Temp 98.3 F (36.8 C) (Oral)   Resp 16   Ht 5\' 8"  (1.727 m)   Wt 91.5 kg   SpO2 95%   BMI 30.67 kg/m   General: 89 y.o. year-old male well developed well nourished in no acute distress.  Alert and oriented x3. Cardiovascular: Irregular rate and rhythm with no rubs or gallops.  No thyromegaly or JVD noted.  1+ pitting edema in lower extremity bilaterally. 2/4 pulses in all 4 extremities. Respiratory: Mild rales at bases. Good inspiratory effort. Abdomen: Soft nontender nondistended with normal bowel sounds x4 quadrants. Muskuloskeletal: No cyanosis or clubbing noted  bilaterally Neuro: CN II-XII intact, strength, sensation, reflexes Skin: No ulcerative lesions noted or rashes Psychiatry: Judgement and insight appear normal. Mood is appropriate for condition and setting          Labs on Admission:  Basic Metabolic Panel: Recent Labs  Lab 04/27/22 1824  NA 136  K 4.7  CL 106  CO2 22  GLUCOSE 86  BUN 43*  CREATININE 1.97*  CALCIUM 8.9   Liver Function Tests: Recent Labs  Lab 04/27/22 1824  AST 24  ALT 14  ALKPHOS 70  BILITOT 0.6  PROT 6.6  ALBUMIN 3.7   No results for input(s): "LIPASE", "AMYLASE" in the last 168 hours. No results for input(s): "AMMONIA" in the last 168 hours. CBC: Recent Labs  Lab 04/27/22 1824  WBC 5.5  HGB 10.5*  HCT 32.2*  MCV 95.3  PLT 161   Cardiac Enzymes: No results for input(s): "CKTOTAL", "CKMB", "CKMBINDEX", "TROPONINI" in the last 168 hours.  BNP (last 3 results) Recent Labs    04/02/22 1145 04/20/22 1211 04/27/22 1824  BNP 146* 159* 316.9*    ProBNP (last 3 results) No results for input(s): "PROBNP" in the last 8760 hours.  CBG: No results for input(s): "GLUCAP" in the last 168 hours.  Radiological Exams on Admission: CT Angio Chest/Abd/Pel for Dissection W and/or Wo Contrast  Result  Date: 04/27/2022 CLINICAL DATA:  Acute aortic syndrome suspected. EXAM: CT ANGIOGRAPHY CHEST, ABDOMEN AND PELVIS TECHNIQUE: Non-contrast CT of the chest was initially obtained. Multidetector CT imaging through the chest, abdomen and pelvis was performed using the standard protocol during bolus administration of intravenous contrast. Multiplanar reconstructed images and MIPs were obtained and reviewed to evaluate the vascular anatomy. RADIATION DOSE REDUCTION: This exam was performed according to the departmental dose-optimization program which includes automated exposure control, adjustment of the mA and/or kV according to patient size and/or use of iterative reconstruction technique. CONTRAST:  74mL OMNIPAQUE IOHEXOL 350 MG/ML SOLN COMPARISON:  CT abdomen pelvis dated 08/30/2021. FINDINGS: CTA CHEST FINDINGS Cardiovascular: There is no cardiomegaly. Small pericardial effusion measuring 7 mm in thickness. Lose three-vessel coronary vascular calcification. Moderate atherosclerotic calcification of the thoracic aorta. No aneurysmal dilatation or dissection. No pulmonary artery embolus identified. Mediastinum/Nodes: No hilar or mediastinal adenopathy. The esophagus is grossly unremarkable. There is a 1 cm right thyroid hypodense nodule. Not clinically significant; no follow-up imaging recommended (ref: J Am Coll Radiol. 2015 Feb;12(2): 143-50).No mediastinal fluid collection. Lungs/Pleura: Small left pleural effusion. There is left lung base atelectasis/scarring. Pneumonia is not excluded. A 3.5 x 1.5 cm ovoid density in the lingula, likely scarring or rounded atelectasis. This was present on the prior chest CT of 2022. There is a 3 mm stable right middle lobe nodule. No pneumothorax. The central airways are patent. Musculoskeletal: Osteopenia with degenerative changes of the spine. Old left rib fractures. No acute osseous pathology. Review of the MIP images confirms the above findings. CTA ABDOMEN AND PELVIS FINDINGS  VASCULAR Aorta: Advanced atherosclerotic calcification the abdominal aorta. There is a 2.8 cm infrarenal aortic ectasia. No aneurysmal dilatation or dissection. No periaortic fluid collection. Celiac: Patent without evidence of aneurysm, dissection, vasculitis or significant stenosis. SMA: Atherosclerotic calcification of the SMA. The SMA remains patent Renals: The renal arteries are patent. IMA: Minimal flow in the IMA. Inflow: There is atherosclerotic calcification of the iliac arteries. 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 The iliac arteries remain patent. Veins: No obvious venous abnormality  within the limitations of this arterial phase study. Review of the MIP images confirms the above findings. NON-VASCULAR No intra-abdominal free air or free fluid. Hepatobiliary: The liver is unremarkable. No biliary ductal dilatation. The gallbladder is unremarkable. Pancreas: There is a 2.8 x 1.7 cm hypoenhancing lesion in the distal body and tail of the pancreas which is not characterized this CT but has increased in size since 2023. No significant metabolic activity was seen on the prior PET suggestive of a possible IPMN. Further characterization with MRI, if not previously performed, is advised. No active inflammatory changes. Spleen: Normal in size without focal abnormality. Adrenals/Urinary Tract: The adrenal glands are unremarkable. Mild bilateral renal parenchyma atrophy. There is no hydronephrosis on either side. The visualized ureters and urinary bladder appear unremarkable. Stomach/Bowel: There is sigmoid diverticulosis without active inflammatory changes. Moderate stool throughout the colon. No bowel obstruction or active inflammation. The appendix is not visualized with certainty. No inflammatory changes identified in the right lower quadrant. Lymphatic: No adenopathy. Reproductive: Mildly enlarged prostate gland with median lobe hypertrophy. Other: None  Musculoskeletal: Osteopenia with degenerative changes of the spine. No acute osseous pathology. Review of the MIP images confirms the above findings. IMPRESSION: 1. No aortic dissection or aneurysm. 2. Small left pleural effusion with left lung base atelectasis/scarring. Pneumonia is not excluded. 3. Sigmoid diverticulosis without active inflammatory changes. No bowel obstruction. 4. Interval increase in size of the hypoenhancing lesion in the distal body and tail of the pancreas. Further characterization with MRI, if not previously performed, is advised. Electronically Signed   By: Anner Crete M.D.   On: 04/27/2022 20:06   DG Chest Port 1 View  Result Date: 04/27/2022 CLINICAL DATA:  Chest pain EXAM: PORTABLE CHEST 1 VIEW COMPARISON:  03/13/2022 x-ray and older FINDINGS: Small left effusion versus pleural thickening with some nodularity and opacity, similar to previous. No pneumothorax or edema. Enlarged cardiopericardial silhouette. Calcified aorta. Overlapping cardiac leads. Degenerative changes seen of the spine and shoulders. IMPRESSION: Enlarged heart. Persistent left effusion, adjacent opacity and nodularity. Appearance is similar to previous. Please correlate with previous workup. Electronically Signed   By: Jill Side M.D.   On: 04/27/2022 18:49    EKG: I independently viewed the EKG done and my findings are as followed: Sinus tachycardia rate of 133.  Nonspecific ST-T changes.  QTc 467.  Assessment/Plan Present on Admission:  Atrial fibrillation with RVR (HCC)  Principal Problem:   Atrial fibrillation with RVR (HCC)  Unspecified atrial fibrillation with RVR CHA2DS2-VASc of 4, defer anticoagulation to cardiology if no contraindication. Obtain TSH Started on Cardizem drip in the ED, continue Added p.o. Lopressor 12.5 mg twice daily Cardiology will see in consultation Closely monitor on telemetry  Elevated troponin, persistent chest pain rule out ACS Received a full dose  aspirin. Obtain fasting lipid panel, A1c Troponin mildly elevated 30, repeat at 32, flat No evidence of acute ischemia on 12 lead EKG Trend Troponins Follow limited 2D echo, most recent 2D echo in December 2023 Cardiology following.  Acute on chronic HFpEF Presented with BNP greater than 300 Bilateral lower extremity edema Left pleural effusion on chest x-ray Last 2D echo done on 01/10/2022 showed LVEF 55 to 60% and grade 1 diastolic dysfunction. Start strict I's and O's and daily weight Home diuretic on hold due to soft BPs  CKD 3B Appears to be at his baseline creatinine 1.97 with GFR 32 Avoid nephrotoxic agents, dehydration, and hypotension Monitor urine output with strict I's and O's Repeat renal  panel in the morning  Obesity BMI 30 Recommend weight loss outpatient with regular physical activity and healthy dieting.  Hyperlipidemia Resume home Lipitor and home Zetia  Chronic anxiety/depression Resume home escitalopram  Memory loss Resume home Namenda  Hypertension, BPs are currently soft Hold off home lisinopril Closely monitor vital signs Maintain MAP greater than 65  History of subarachnoid hemorrhage 3 years ago, post trauma/fall Hematoma, large deep tissue hematoma on thigh from trauma.    Critical care time: 65 minutes.    DVT prophylaxis: Subcu Lovenox daily  Code Status: Full code  Family Communication: Updated his wife at bedside  Disposition Plan: Admitted to telemetry cardiac unit  Consults called: Cardiology  Admission status: Inpatient status.   Status is: Inpatient The patient requires at least 2 midnights for further evaluation and treatment of present condition.   Kayleen Memos MD [facility] Pager [phone]  If 7PM-7AM, please contact night-coverage [email or web address] Password [facility]  04/27/2022, 11:21 PM

## 2022-04-28 DIAGNOSIS — I4891 Unspecified atrial fibrillation: Secondary | ICD-10-CM | POA: Diagnosis not present

## 2022-04-28 DIAGNOSIS — R7989 Other specified abnormal findings of blood chemistry: Secondary | ICD-10-CM

## 2022-04-28 DIAGNOSIS — R079 Chest pain, unspecified: Secondary | ICD-10-CM

## 2022-04-28 LAB — COMPREHENSIVE METABOLIC PANEL
ALT: 14 U/L (ref 0–44)
AST: 25 U/L (ref 15–41)
Albumin: 3 g/dL — ABNORMAL LOW (ref 3.5–5.0)
Alkaline Phosphatase: 65 U/L (ref 38–126)
Anion gap: 9 (ref 5–15)
BUN: 37 mg/dL — ABNORMAL HIGH (ref 8–23)
CO2: 24 mmol/L (ref 22–32)
Calcium: 8.5 mg/dL — ABNORMAL LOW (ref 8.9–10.3)
Chloride: 101 mmol/L (ref 98–111)
Creatinine, Ser: 1.95 mg/dL — ABNORMAL HIGH (ref 0.61–1.24)
GFR, Estimated: 33 mL/min — ABNORMAL LOW (ref >60)
Glucose, Bld: 189 mg/dL — ABNORMAL HIGH (ref 70–99)
Potassium: 4.5 mmol/L (ref 3.5–5.1)
Sodium: 134 mmol/L — ABNORMAL LOW (ref 135–145)
Total Bilirubin: 0.7 mg/dL (ref 0.3–1.2)
Total Protein: 5.5 g/dL — ABNORMAL LOW (ref 6.5–8.1)

## 2022-04-28 LAB — LIPID PANEL
Cholesterol: 110 mg/dL (ref 0–200)
HDL: 50 mg/dL (ref >40)
LDL Cholesterol: 52 mg/dL (ref 0–99)
Total CHOL/HDL Ratio: 2.2 RATIO
Triglycerides: 39 mg/dL (ref <150)
VLDL: 8 mg/dL (ref 0–40)

## 2022-04-28 LAB — MAGNESIUM: Magnesium: 1.9 mg/dL (ref 1.7–2.4)

## 2022-04-28 LAB — CBC
HCT: 31 % — ABNORMAL LOW (ref 39.0–52.0)
Hemoglobin: 10.3 g/dL — ABNORMAL LOW (ref 13.0–17.0)
MCH: 31.7 pg (ref 26.0–34.0)
MCHC: 33.2 g/dL (ref 30.0–36.0)
MCV: 95.4 fL (ref 80.0–100.0)
Platelets: 164 10*3/uL (ref 150–400)
RBC: 3.25 MIL/uL — ABNORMAL LOW (ref 4.22–5.81)
RDW: 13.1 % (ref 11.5–15.5)
WBC: 6.2 10*3/uL (ref 4.0–10.5)
nRBC: 0 % (ref 0.0–0.2)

## 2022-04-28 LAB — PHOSPHORUS: Phosphorus: 3.2 mg/dL (ref 2.5–4.6)

## 2022-04-28 LAB — TROPONIN I (HIGH SENSITIVITY)
Troponin I (High Sensitivity): 640 ng/L (ref <18)
Troponin I (High Sensitivity): 1114 ng/L (ref <18)

## 2022-04-28 LAB — LIPASE, BLOOD: Lipase: 38 U/L (ref 11–51)

## 2022-04-28 MED ORDER — ESCITALOPRAM OXALATE 10 MG PO TABS
10.0000 mg | ORAL_TABLET | Freq: Every day | ORAL | Status: DC
  Administered 2022-04-28 – 2022-05-02 (×5): 10 mg via ORAL
  Filled 2022-04-28 (×5): qty 1

## 2022-04-28 MED ORDER — FUROSEMIDE 40 MG PO TABS
40.0000 mg | ORAL_TABLET | Freq: Every day | ORAL | Status: DC
  Administered 2022-04-28 – 2022-04-29 (×2): 40 mg via ORAL
  Filled 2022-04-28 (×3): qty 1

## 2022-04-28 MED ORDER — FLUTICASONE FUROATE-VILANTEROL 200-25 MCG/ACT IN AEPB
1.0000 | INHALATION_SPRAY | Freq: Every day | RESPIRATORY_TRACT | Status: DC
  Administered 2022-04-29 – 2022-05-02 (×4): 1 via RESPIRATORY_TRACT
  Filled 2022-04-28: qty 28

## 2022-04-28 MED ORDER — MELATONIN 5 MG PO TABS
5.0000 mg | ORAL_TABLET | Freq: Every evening | ORAL | Status: DC | PRN
  Administered 2022-04-28 – 2022-04-30 (×3): 5 mg via ORAL
  Filled 2022-04-28 (×3): qty 1

## 2022-04-28 MED ORDER — PROCHLORPERAZINE EDISYLATE 10 MG/2ML IJ SOLN
5.0000 mg | Freq: Four times a day (QID) | INTRAMUSCULAR | Status: DC | PRN
  Administered 2022-04-30: 5 mg via INTRAVENOUS
  Filled 2022-04-28: qty 2

## 2022-04-28 MED ORDER — ATORVASTATIN CALCIUM 10 MG PO TABS
20.0000 mg | ORAL_TABLET | Freq: Every day | ORAL | Status: DC
  Administered 2022-04-28 – 2022-05-02 (×5): 20 mg via ORAL
  Filled 2022-04-28 (×5): qty 2

## 2022-04-28 MED ORDER — DOXAZOSIN MESYLATE 2 MG PO TABS
2.0000 mg | ORAL_TABLET | Freq: Every day | ORAL | Status: DC
  Administered 2022-04-28 – 2022-05-02 (×5): 2 mg via ORAL
  Filled 2022-04-28 (×5): qty 1

## 2022-04-28 MED ORDER — MEMANTINE HCL 10 MG PO TABS
5.0000 mg | ORAL_TABLET | Freq: Every day | ORAL | Status: DC
  Administered 2022-04-28 – 2022-05-01 (×4): 5 mg via ORAL
  Filled 2022-04-28 (×4): qty 1

## 2022-04-28 MED ORDER — POLYETHYLENE GLYCOL 3350 17 G PO PACK
17.0000 g | PACK | Freq: Every day | ORAL | Status: DC | PRN
  Filled 2022-04-28: qty 1

## 2022-04-28 MED ORDER — EZETIMIBE 10 MG PO TABS
10.0000 mg | ORAL_TABLET | Freq: Every day | ORAL | Status: DC
  Administered 2022-04-28 – 2022-05-02 (×5): 10 mg via ORAL
  Filled 2022-04-28 (×5): qty 1

## 2022-04-28 MED ORDER — ACETAMINOPHEN 325 MG PO TABS
650.0000 mg | ORAL_TABLET | Freq: Four times a day (QID) | ORAL | Status: DC | PRN
  Administered 2022-04-29 – 2022-05-01 (×4): 650 mg via ORAL
  Filled 2022-04-28 (×5): qty 2

## 2022-04-28 MED ORDER — LISINOPRIL 20 MG PO TABS
20.0000 mg | ORAL_TABLET | Freq: Every day | ORAL | Status: DC
  Administered 2022-04-28 – 2022-04-29 (×2): 20 mg via ORAL
  Filled 2022-04-28 (×3): qty 1

## 2022-04-28 MED ORDER — ASPIRIN 81 MG PO TBEC
81.0000 mg | DELAYED_RELEASE_TABLET | Freq: Every day | ORAL | Status: DC
  Administered 2022-04-28 – 2022-05-02 (×5): 81 mg via ORAL
  Filled 2022-04-28 (×5): qty 1

## 2022-04-28 MED ORDER — ASPIRIN 325 MG PO TABS
325.0000 mg | ORAL_TABLET | Freq: Once | ORAL | Status: AC
  Administered 2022-04-28: 325 mg via ORAL
  Filled 2022-04-28: qty 1

## 2022-04-28 NOTE — Progress Notes (Signed)
Seen by fellow earlier today  Stable no chest pain TTE pending Converted to NSR  Agree he is not a candidate for anticoagulation  Jenkins Rouge MD [facility]

## 2022-04-28 NOTE — Progress Notes (Addendum)
   04/27/22 2314  Assess: MEWS Score  Temp 98 F (36.7 C)  BP (!) 129/96  Pulse Rate (!) 136  ECG Heart Rate (!) 135  Resp 16  SpO2 95 %  O2 Device Room Air  Assess: MEWS Score  MEWS Temp 0  MEWS Systolic 0  MEWS Pulse 3  MEWS RR 0  MEWS LOC 0  MEWS Score 3  MEWS Score Color Yellow  Assess: if the MEWS score is Yellow or Red  Were vital signs taken at a resting state? Yes  Focused Assessment Change from prior assessment (see assessment flowsheet)  Does the patient meet 2 or more of the SIRS criteria? Yes  Does the patient have a confirmed or suspected source of infection? No  MEWS guidelines implemented  Yes, yellow  Treat  MEWS Interventions Considered administering scheduled or prn medications/treatments as ordered  Take Vital Signs  Increase Vital Sign Frequency  Yellow: Q2hr x1, continue Q4hrs until patient remains green for 12hrs  Escalate  MEWS: Escalate Yellow: Discuss with charge nurse and consider notifying provider and/or RRT  Notify: Charge Nurse/RN  Name of Charge Nurse/RN Notified Presenter, broadcasting  Provider Notification  Provider Name/Title Irene Pap MD  Date Provider Notified 04/27/22  Time Provider Notified 2320  Method of Notification Page  Notification Reason New onset of dysrhythmia (pt admitted from [place])  Provider response At bedside  Date of Provider Response 04/27/22  Time of Provider Response 2330  Assess: SIRS CRITERIA  SIRS Temperature  0  SIRS Pulse 1  SIRS Respirations  0  SIRS WBC 0  SIRS Score Sum  1

## 2022-04-28 NOTE — Progress Notes (Signed)
PROGRESS NOTE    [name]  [id number] DOB: 17-Feb-1933 DOA: 04/27/2022 PCP: [name], MD     Brief Narrative:  [name] is a 89 y.o. male with medical history significant for coronary artery disease status post PCI with stent placement, HFpEF 55 to 60% with grade 1 diastolic dysfunction, who initially presented to [facility] ED with complaints of chest pain.  Centrally located, sharp, non radiating, worse with deep breath and present at rest.  Associated with shortness of breath with minimal exertion.   While in the ED, lab studies are notable for elevated high-sensitivity troponin 30, uptrended to 32, flat.  EDP discussed the case with cardiology on-call who recommended admission to hospitalist service for further workup.   Upon arrival to [facility] telemetry cardiac unit, the patient complaints of persistent sharp centrally located chest pain.  In Afib with RVR on diltiazem drip.  Po Lopressor added with some improvement of the heart rate.  Denies palpitations even when in Afib rvr.  Cardiology consulted.  New events last 24 hours / Subjective: Patient feeling well.  He has converted to normal sinus rhythm.  He denies any chest pain.  Has some central upper back pain that worsens with deep breath, does not radiate.   Assessment & Plan:   Principal Problem:   Atrial fibrillation with RVR (HCC) Active Problems:   Benign prostatic hyperplasia   Essential hypertension   Stage 3b chronic kidney disease (HCC)   CAD (coronary artery disease)   History of SAH (subarachnoid hemorrhage)   A-fib RVR -Discussed with patient and wife at bedside, patient remains poor candidate for anticoagulation due to fall risk, history of subdural hematoma, hematuria -Aspirin, Lopressor -Cardiology following  Demand ischemia -In setting of A-fib RVR -Echocardiogram pending  Acute on chronic diastolic heart failure -Echo pending -With peripheral edema -Lasix   CKD stage  IIIb -Baseline creatinine 1.97 -Stable  Hypertension -Lisinopril  Hyperlipidemia -Lipitor, Zetia  Anxiety/depression -Escitalopram  Memory loss -Namenda  Obesity -BMI 30.67  Lesion in tail of pancreas -MRI abdomen in 2022 revealed cystic lesion in pancreatic tail -PET scan in 2022 revealed benign inflammatory etiology of left upper lobe chest lesion, no substantial hypermetabolic activity associated with cystic lesion of pancreatic tail -Follow-up outpatient  DVT prophylaxis:  enoxaparin (LOVENOX) injection 30 mg Start: 04/28/22 1000  Code Status: Full Family Communication: Wife at bedside  Disposition Plan:  Status is: Inpatient Remains inpatient appropriate because: Monitor rate, echo pending  Consultants:  Cardiology    Antimicrobials:  Anti-infectives (From admission, onward)    None        Objective: Vitals:   04/28/22 0327 04/28/22 0553 04/28/22 0837 04/28/22 1213  BP:  112/62 (!) 125/56 125/73  Pulse:  68 68 80  Resp:   16 17  Temp: 98.9 F (37.2 C)  98.6 F (37 C) 98.4 F (36.9 C)  TempSrc: Oral  Oral Oral  SpO2:  91% 96% 97%  Weight:      Height:        Intake/Output Summary (Last 24 hours) at 04/28/2022 1439 Last data filed at 04/28/2022 0600 Gross per 24 hour  Intake 48.98 ml  Output 550 ml  Net -501.02 ml   Filed Weights   04/27/22 1802 04/27/22 2300 04/28/22 0323  Weight: 90.7 kg 91.5 kg 91.5 kg    Examination:  General exam: Appears calm and comfortable  Respiratory system: Clear to auscultation. Respiratory effort normal. No respiratory distress. No conversational dyspnea.  Cardiovascular system:  S1 & S2 heard, RRR. No murmurs. No pedal edema. Gastrointestinal system: Abdomen is nondistended, soft and nontender. Normal bowel sounds heard. Central nervous system: Alert and oriented. No focal neurological deficits. Speech clear.  Extremities: Symmetric in appearance  Skin: No rashes, lesions or ulcers on exposed skin   Psychiatry: Judgement and insight appear normal. Mood & affect appropriate.   Data Reviewed: I have personally reviewed following labs and imaging studies  CBC: Recent Labs  Lab 04/27/22 1824 04/28/22 0128  WBC 5.5 6.2  HGB 10.5* 10.3*  HCT 32.2* 31.0*  MCV 95.3 95.4  PLT 161 [id number]   Basic Metabolic Panel: Recent Labs  Lab 04/27/22 1824 04/28/22 0128  NA 136 134*  K 4.7 4.5  CL 106 101  CO2 22 24  GLUCOSE 86 189*  BUN 43* 37*  CREATININE 1.97* 1.95*  CALCIUM 8.9 8.5*  MG  --  1.9  PHOS  --  3.2   GFR: Estimated Creatinine Clearance: 29.8 mL/min (A) (by C-G formula based on SCr of 1.95 mg/dL (H)). Liver Function Tests: Recent Labs  Lab 04/27/22 1824 04/28/22 0128  AST 24 25  ALT 14 14  ALKPHOS 70 65  BILITOT 0.6 0.7  PROT 6.6 5.5*  ALBUMIN 3.7 3.0*   Recent Labs  Lab 04/28/22 0122  LIPASE 38   No results for input(s): "AMMONIA" in the last 168 hours. Coagulation Profile: No results for input(s): "INR", "PROTIME" in the last 168 hours. Cardiac Enzymes: No results for input(s): "CKTOTAL", "CKMB", "CKMBINDEX", "TROPONINI" in the last 168 hours. BNP (last 3 results) No results for input(s): "PROBNP" in the last 8760 hours. HbA1C: No results for input(s): "HGBA1C" in the last 72 hours. CBG: No results for input(s): "GLUCAP" in the last 168 hours. Lipid Profile: Recent Labs    04/28/22 0122  CHOL 110  HDL 50  LDLCALC 52  TRIG 39  CHOLHDL 2.2   Thyroid Function Tests: No results for input(s): "TSH", "T4TOTAL", "FREET4", "T3FREE", "THYROIDAB" in the last 72 hours. Anemia Panel: No results for input(s): "VITAMINB12", "FOLATE", "FERRITIN", "TIBC", "IRON", "RETICCTPCT" in the last 72 hours. Sepsis Labs: No results for input(s): "PROCALCITON", "LATICACIDVEN" in the last 168 hours.  No results found for this or any previous visit (from the past 240 hour(s)).    Radiology Studies: CT Angio Chest/Abd/Pel for Dissection W and/or Wo Contrast  Result  Date: 04/27/2022 CLINICAL DATA:  Acute aortic syndrome suspected. EXAM: CT ANGIOGRAPHY CHEST, ABDOMEN AND PELVIS TECHNIQUE: Non-contrast CT of the chest was initially obtained. Multidetector CT imaging through the chest, abdomen and pelvis was performed using the standard protocol during bolus administration of intravenous contrast. Multiplanar reconstructed images and MIPs were obtained and reviewed to evaluate the vascular anatomy. RADIATION DOSE REDUCTION: This exam was performed according to the departmental dose-optimization program which includes automated exposure control, adjustment of the mA and/or kV according to patient size and/or use of iterative reconstruction technique. CONTRAST:  34mL OMNIPAQUE IOHEXOL 350 MG/ML SOLN COMPARISON:  CT abdomen pelvis dated 08/30/2021. FINDINGS: CTA CHEST FINDINGS Cardiovascular: There is no cardiomegaly. Small pericardial effusion measuring 7 mm in thickness. Lose three-vessel coronary vascular calcification. Moderate atherosclerotic calcification of the thoracic aorta. No aneurysmal dilatation or dissection. No pulmonary artery embolus identified. Mediastinum/Nodes: No hilar or mediastinal adenopathy. The esophagus is grossly unremarkable. There is a 1 cm right thyroid hypodense nodule. Not clinically significant; no follow-up imaging recommended (ref: J Am Coll Radiol. 2015 Feb;12(2): 143-50).No mediastinal fluid collection. Lungs/Pleura: Small left pleural effusion. There is  left lung base atelectasis/scarring. Pneumonia is not excluded. A 3.5 x 1.5 cm ovoid density in the lingula, likely scarring or rounded atelectasis. This was present on the prior chest CT of 2022. There is a 3 mm stable right middle lobe nodule. No pneumothorax. The central airways are patent. Musculoskeletal: Osteopenia with degenerative changes of the spine. Old left rib fractures. No acute osseous pathology. Review of the MIP images confirms the above findings. CTA ABDOMEN AND PELVIS FINDINGS  VASCULAR Aorta: Advanced atherosclerotic calcification the abdominal aorta. There is a 2.8 cm infrarenal aortic ectasia. No aneurysmal dilatation or dissection. No periaortic fluid collection. Celiac: Patent without evidence of aneurysm, dissection, vasculitis or significant stenosis. SMA: Atherosclerotic calcification of the SMA. The SMA remains patent Renals: The renal arteries are patent. IMA: Minimal flow in the IMA. Inflow: There is atherosclerotic calcification of the iliac arteries. 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 The iliac arteries remain patent. Veins: No obvious venous abnormality within the limitations of this arterial phase study. Review of the MIP images confirms the above findings. NON-VASCULAR No intra-abdominal free air or free fluid. Hepatobiliary: The liver is unremarkable. No biliary ductal dilatation. The gallbladder is unremarkable. Pancreas: There is a 2.8 x 1.7 cm hypoenhancing lesion in the distal body and tail of the pancreas which is not characterized this CT but has increased in size since 2023. No significant metabolic activity was seen on the prior PET suggestive of a possible IPMN. Further characterization with MRI, if not previously performed, is advised. No active inflammatory changes. Spleen: Normal in size without focal abnormality. Adrenals/Urinary Tract: The adrenal glands are unremarkable. Mild bilateral renal parenchyma atrophy. There is no hydronephrosis on either side. The visualized ureters and urinary bladder appear unremarkable. Stomach/Bowel: There is sigmoid diverticulosis without active inflammatory changes. Moderate stool throughout the colon. No bowel obstruction or active inflammation. The appendix is not visualized with certainty. No inflammatory changes identified in the right lower quadrant. Lymphatic: No adenopathy. Reproductive: Mildly enlarged prostate gland with median lobe hypertrophy. Other: None  Musculoskeletal: Osteopenia with degenerative changes of the spine. No acute osseous pathology. Review of the MIP images confirms the above findings. IMPRESSION: 1. No aortic dissection or aneurysm. 2. Small left pleural effusion with left lung base atelectasis/scarring. Pneumonia is not excluded. 3. Sigmoid diverticulosis without active inflammatory changes. No bowel obstruction. 4. Interval increase in size of the hypoenhancing lesion in the distal body and tail of the pancreas. Further characterization with MRI, if not previously performed, is advised. Electronically Signed   By: Anner Crete M.D.   On: 04/27/2022 20:06   DG Chest Port 1 View  Result Date: 04/27/2022 CLINICAL DATA:  Chest pain EXAM: PORTABLE CHEST 1 VIEW COMPARISON:  03/13/2022 x-ray and older FINDINGS: Small left effusion versus pleural thickening with some nodularity and opacity, similar to previous. No pneumothorax or edema. Enlarged cardiopericardial silhouette. Calcified aorta. Overlapping cardiac leads. Degenerative changes seen of the spine and shoulders. IMPRESSION: Enlarged heart. Persistent left effusion, adjacent opacity and nodularity. Appearance is similar to previous. Please correlate with previous workup. Electronically Signed   By: Jill Side M.D.   On: 04/27/2022 18:49      Scheduled Meds:  aspirin EC  81 mg Oral Daily   atorvastatin  20 mg Oral Daily   doxazosin  2 mg Oral Daily   enoxaparin (LOVENOX) injection  30 mg Subcutaneous Q24H   escitalopram  10 mg Oral Daily  ezetimibe  10 mg Oral Daily   fluticasone furoate-vilanterol  1 puff Inhalation Daily   furosemide  40 mg Oral Daily   lisinopril  20 mg Oral Daily   memantine  5 mg Oral QHS   metoprolol tartrate  12.5 mg Oral BID   Continuous Infusions:   LOS: 1 day   Time spent: 35 minutes   Dessa Phi, DO [facility] 04/28/2022, 2:39 PM   Available via Epic secure chat 7am-7pm After these hours, please refer to coverage provider  listed on [email or web address]

## 2022-04-28 NOTE — Evaluation (Signed)
Physical Therapy Evaluation Patient Details Name: [name] MRN: [id number] DOB: December 27, 1933 Today's Date: 04/28/2022  History of Present Illness  Pt is a 89 y.o. M who presents 04/27/2022 with complaints of chest pain. Admitted with Afib RVR. Significant PMH: CAD s/p PCI, HFpEF 55-60%, SAH, Stage 3b CKD.  Clinical Impression  PTA, pt lives with his spouse and daughter, is independent with Rollator vs no AD, and requires supervision for IADL's. Pt reporting improvement in chest pain; now just with inhalation. Pt ambulating 105 ft with a walker at a min assist level. Demonstrates left drift and shuffling gait pattern, requires assist for environmental and obstacle negotiation. SpO2 96%-98% on RA, HR 73-102 bpm. BP pre mobility 143/59 (80), post mobility 182/60. Pt would benefit from follow up HHPT to address gait training, balance, and endurance.     Recommendations for follow up therapy are one component of a multi-disciplinary discharge planning process, led by the attending physician.  Recommendations may be updated based on patient status, additional functional criteria and insurance authorization.  Follow Up Recommendations Home health PT      Assistance Recommended at Discharge Frequent or constant Supervision/Assistance  Patient can return home with the following  A little help with walking and/or transfers;A lot of help with bathing/dressing/bathroom;Assistance with cooking/housework;Direct supervision/assist for financial management;Direct supervision/assist for medications management;Assist for transportation;Help with stairs or ramp for entrance    Equipment Recommendations None recommended by PT  Recommendations for Other Services       Functional Status Assessment Patient has had a recent decline in their functional status and demonstrates the ability to make significant improvements in function in a reasonable and predictable amount of time.     Precautions /  Restrictions Precautions Precautions: Fall Restrictions Weight Bearing Restrictions: No      Mobility  Bed Mobility Overal bed mobility: Needs Assistance Bed Mobility: Supine to Sit, Sit to Supine     Supine to sit: Min assist Sit to supine: Min assist   General bed mobility comments: MinA for trunk elevation to sit up and LE assist back into bed    Transfers Overall transfer level: Needs assistance Equipment used: Rolling walker (2 wheels) Transfers: Sit to/from Stand Sit to Stand: Min guard                Ambulation/Gait Ambulation/Gait assistance: Herbalist (Feet): 105 Feet Assistive device: Rolling walker (2 wheels) Gait Pattern/deviations: Step-through pattern, Decreased stride length, Drifts right/left, Shuffle Gait velocity: decreased     General Gait Details: Tendency to drift left, minA to correct RW and for environmental/obstacle negotiation. Cues for increased foot clearance  Stairs            Wheelchair Mobility    Modified Rankin (Stroke Patients Only)       Balance Overall balance assessment: Needs assistance Sitting-balance support: Feet supported Sitting balance-Leahy Scale: Good     Standing balance support: Bilateral upper extremity supported Standing balance-Leahy Scale: Poor Standing balance comment: reliant on BUE support                             Pertinent Vitals/Pain Pain Assessment Pain Assessment: 0-10 Pain Score: 9  Pain Location: Reports 9/10 at rest, however, smiling and joking with therapist. Reports worsens with inhalation, anterior chest and in between scapula. Reports improvement since admission. No change with ambulation Pain Descriptors / Indicators: Aching Pain Intervention(s): Monitored during session, Limited activity within patient's tolerance  Home Living Family/patient expects to be discharged to:: Private residence Living Arrangements: Spouse/significant other;Children  (spouse, 6 y.o.) Available Help at Discharge: Family Type of Home: House Home Access: Stairs to enter   Technical brewer of Steps: 5 Alternate Level Stairs-Number of Steps: [place]: Cane - single point;Rollator (4 wheels);Shower seat      Prior Function Prior Level of Function : Independent/Modified Independent             Mobility Comments: using Rollator for community ambulation ADLs Comments: supervision for IADL's due to Alzheimer's     Hand Dominance        Extremity/Trunk Assessment   Upper Extremity Assessment Upper Extremity Assessment: RUE deficits/detail;LUE deficits/detail RUE Deficits / Details: Shoulder flexion to ~100 degrees LUE Deficits / Details: shoulder flexion to ~110 degrees    Lower Extremity Assessment Lower Extremity Assessment: Overall WFL for tasks assessed    Cervical / Trunk Assessment Cervical / Trunk Assessment: Kyphotic  Communication   Communication: No difficulties  Cognition Arousal/Alertness: Awake/alert Behavior During Therapy: WFL for tasks assessed/performed Overall Cognitive Status: History of cognitive impairments - at baseline                                 General Comments: Pleasant and cooperative, joking with therapist. Inconsistent with symptom presentation        General Comments      Exercises     Assessment/Plan    PT Assessment Patient needs continued PT services  PT Problem List Decreased strength;Decreased activity tolerance;Decreased balance;Decreased mobility;Cardiopulmonary status limiting activity;Pain       PT Treatment Interventions DME instruction;Gait training;Functional mobility training;Therapeutic activities;Therapeutic exercise;Stair training;Balance training;Patient/family education    PT Goals (Current goals can be found in the Care Plan section)  Acute Rehab PT Goals Patient Stated Goal: pt spouse agreeable to HHPT PT Goal  Formulation: With patient/family Time For Goal Achievement: 05/12/22 Potential to Achieve Goals: Good    Frequency Min 3X/week     Co-evaluation               AM-PAC PT "6 Clicks" Mobility  Outcome Measure Help needed turning from your back to your side while in a flat bed without using bedrails?: None Help needed moving from lying on your back to sitting on the side of a flat bed without using bedrails?: A Little Help needed moving to and from a bed to a chair (including a wheelchair)?: A Little Help needed standing up from a chair using your arms (e.g., wheelchair or bedside chair)?: A Little Help needed to walk in hospital room?: A Little Help needed climbing 3-5 steps with a railing? : A Lot 6 Click Score: 18    End of Session Equipment Utilized During Treatment: Gait belt Activity Tolerance: Patient tolerated treatment well Patient left: in bed;with bed alarm set;with call bell/phone within reach   PT Visit Diagnosis: Unsteadiness on feet (R26.81);Difficulty in walking, not elsewhere classified (R26.2)    Time: [id number] PT Time Calculation (min) (ACUTE ONLY): 25 min   Charges:   PT Evaluation $PT Eval Moderate Complexity: 1 Mod PT Treatments $Therapeutic Activity: 8-22 mins        Wyona Almas, PT, DPT Acute Rehabilitation Services Office [phone]   Deno Etienne 04/28/2022, 4:59 PM

## 2022-04-28 NOTE — Consult Note (Signed)
Cardiology Consultation:   Patient ID: CARTIER LLANES MRN: [id number]; DOB: 25-Apr-1933  Admit date: 04/27/2022 Date of Consult: 04/28/2022  Primary Care Provider: [name], MD [facility] Cardiologist: Sherren Mocha, MD  [facility] Electrophysiologist:  None    Patient Profile:   [name] is a 89 y.o. male with a hx of Alzheimer's dementia, HTN, HLD, T2DM (A1c 6% 12/2021), CKD, traumatic [place] (2021), CAD (DES-pLAD 2008), and HFpEF who is being seen today for the evaluation of chest pain at the request of Hospital Medicine.  History of Present Illness:   [name] reports that he was USOH until around 5pm on 3/22 when he began to experience acute onset, sharp, non-radiating chest pain. Exacerbated by deep breathing. Unrelated to exertion. Has never experienced similar CP in past. Presented to [facility] ED for further evaluation.  On arrival to [place] ED, [name] was afebrile and hypertensive with BP [id number] systolic / [id number]. Workup in [facility] ED included the following: - BMP with stably elevated Cr (1.95). eGFR 33.  - CBC with chronic anemia. Hgb 10.3.  - hsTnT mildly elevated and flat (30 --> 32). - EKG showed normal sinus rhythm, intraventricular conduction delay, no acute ST changes. Stable from prior EKG in February 2024.   - CTA C/A/P showed no e/o dissection or aneurysm, small L pleural effusion, small pericardial effusion, no significant coronary calcifications.   While in ED undergoing evaluation, patient went into AFRVR with sustained HR 150-180bpm. No prior history of AF. Asymptomatic with change in rhythm. Started on diltiazem gtt. Cardiology consulted for further evaluation of CP and management of AF.   Upon evaluation by Cardiology overnight, patient's HR had improved to 60-80bpm. Dilt gtt weaned off. He reports marked improvement in pain with rate control. This AM, states that his pain has resolved. Now in NSR.    Past Medical  History:  Diagnosis Date   Acute blood loss anemia    Acute upper respiratory infections of unspecified site 11/04/2012   AKI (acute kidney injury)    Benign prostatic hyperplasia    CAD (coronary artery disease)    s/p cypher DES to pLAD 6/08; normal LVF;  ETT-Myoview 2009: no ischemia    Clavicle fracture 08/10/2019   Closed displaced fracture of phalanx of left thumb, sequela 08/10/2019   Coronary atherosclerosis of native coronary artery 03/08/2008   Dizziness 03/08/2020   Eosinophilia    Essential hypertension    Fracture of multiple ribs with pain 08/10/2019   Hematoma 11/04/2012   History of multiple falls    History of SAH (subarachnoid hemorrhage) 07/14/2019   Hyperlipidemia type IIB / III 03/08/2008   Itching of ear 07/14/2021   Major neurocognitive disorder due to possible Alzheimer's disease, without behavioral disturbance 02/29/2020   MI (myocardial infarction)    Moderate persistent asthma    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   Imaging revealed [facility]   Multiple trauma    Nocturnal leg cramps 11/23/2012   Orthostatic hypotension 03/08/2008   Otalgia of left ear 05/11/2021   Otorrhagia of left ear 03/30/2021   Pain in joint, shoulder region 11/23/2012   Stage 3b chronic kidney disease    Thrombocytopenia    Trigeminal neuralgia    Type 2 diabetes mellitus     Past Surgical History:  Procedure Laterality Date   APPENDECTOMY     CARDIAC CATHETERIZATION  07/30/2006   CORONARY ANGIOPLASTY WITH STENT PLACEMENT   CARDIAC CATHETERIZATION  CATARACT EXTRACTION Right 03/2022   CATARACT EXTRACTION Left 04/2022   EXPLORATORY LAPAROTOMY     age 23   EXPLORATORY LAPAROTOMY     IR THORACENTESIS ASP PLEURAL SPACE W/IMG GUIDE  10/05/2019     Home Medications:  Prior to Admission medications   Medication Sig Start Date End Date Taking? Authorizing Provider  lisinopril (ZESTRIL) 20 MG tablet Take 1 tablet (20 mg total) by mouth daily.  04/24/22   [name], MD  albuterol [facility] [place] HFA) [phone] Base) MCG/ACT inhaler INHALE 2 PUFFS INTO THE LUNGS EVERY 6 HOURS AS NEEDED FOR WHEEZING OR SHORTNESS OF BREATH 08/06/19   Angiulli, Lavon Paganini, PA-C  atorvastatin (LIPITOR) 20 MG tablet Take 1 tablet (20 mg total) by mouth daily. 08/17/21   [name], MD  doxazosin (CARDURA) 2 MG tablet TAKE 1 TABLET BY MOUTH EVERY DAY 01/18/22   [name], MD  empagliflozin (JARDIANCE) 10 MG TABS tablet Take 1 tablet (10 mg total) by mouth daily before breakfast. 01/16/22   [name], MD  escitalopram (LEXAPRO) 10 MG tablet TAKE 1 TABLET BY MOUTH EVERY DAY 01/19/22   [name], MD  ezetimibe (ZETIA) 10 MG tablet TAKE 1 TABLET BY MOUTH DAILY. 05/24/21   Sherren Mocha, MD  fluticasone furoate-vilanterol (BREO ELLIPTA) 200-25 MCG/ACT AEPB INHALE 1 PUFF DAILY *NEED APPT* 01/16/22   [name], MD  furosemide (LASIX) 40 MG tablet Take 1 tablet (40 mg total) by mouth every other day. 12/25/21   Sherren Mocha, MD  loratadine (CLARITIN) 10 MG tablet Take 10 mg by mouth daily as needed for allergies or rhinitis.     [provider]  meclizine (ANTIVERT) 12.5 MG tablet Take 1 tablet (12.5 mg total) by mouth 3 (three) times daily as needed for dizziness. 09/28/20   Lajean Saver, MD  memantine (NAMENDA) 5 MG tablet TAKE 1 TABLET BY MOUTH EVERYDAY AT BEDTIME 04/09/22   Rondel Jumbo, PA-C  Multiple Vitamin (MULTIVITAMIN WITH MINERALS) TABS tablet Take 1 tablet by mouth daily.    [provider]    Inpatient Medications: Scheduled Meds:  enoxaparin (LOVENOX) injection  30 mg Subcutaneous Q24H   metoprolol tartrate  12.5 mg Oral BID   Continuous Infusions:  diltiazem (CARDIZEM) infusion 7.5 mg/hr (04/28/22 0030)   PRN Meds: acetaminophen, melatonin, polyethylene glycol, prochlorperazine  Allergies:    Allergies  Allergen Reactions   Gabapentin Other (See Comments)    Visual changes and confusion- "I  went blind while I was driving"    Social History:   Social History   Socioeconomic History   Marital status: Married    Spouse name: [name]   Number of children: 1   Years of education: 20   Highest education level: Occupational hygienist History   Occupation: retired professor    Comment: History    Occupation: missionary  Tobacco Use   Smoking status: Never   Smokeless tobacco: Never  Scientific laboratory technician Use: Never used  Substance and Sexual Activity   Alcohol use: Not Currently   Drug use: Never   Sexual activity: Yes    Partners: Female  Other Topics Concern   Not on file  Social History Narrative   ** Merged History Encounter **       Lives with his wife (second marriage in 2015, first marriage ended when his wife died alzheimer's disease). Since his remarriage, his adult daughter doesn't speak with him.  Right handed   Social Determinants of Health   Financial Resource Strain: Low Risk  (02/09/2022)   Overall Financial Resource Strain (CARDIA)    Difficulty of Paying Living Expenses: Not hard at all  Food Insecurity: No Food Insecurity (04/28/2022)   Hunger Vital Sign    Worried About Running Out of Food in the Last Year: Never true    Ran Out of Food in the Last Year: Never true  Transportation Needs: No Transportation Needs (04/28/2022)   PRAPARE - Hydrologist (Medical): No    Lack of Transportation (Non-Medical): No  Physical Activity: Not on file  Stress: Not on file  Social Connections: Not on file  Intimate Partner Violence: Not At Risk (04/28/2022)   Humiliation, Afraid, Rape, and Kick questionnaire    Fear of Current or Ex-Partner: No    Emotionally Abused: No    Physically Abused: No    Sexually Abused: No    Family History:   Family History  Problem Relation Age of Onset   Stomach cancer Mother    Memory loss Mother    Heart disease Father    Coronary artery disease Father 88   Heart attack Father    Heart  disease Brother    Aortic aneurysm Brother    Colon cancer Brother    Aortic aneurysm Brother    Colon cancer Brother    Arthritis Daughter        rheumatoid   Coronary artery disease Brother    Esophageal cancer Neg Hx    Rectal cancer Neg Hx      Physical Exam/Data:   Vitals:   04/27/22 2338 04/28/22 0053 04/28/22 0153 04/28/22 0223  BP: 122/82 (!) 96/57 101/67 (!) 115/58  Pulse: (!) 138 89 (!) 108 (!) 108  Resp:  16 16   Temp:      TempSrc:      SpO2: 96% 97% 96% 96%  Weight:      Height:        Intake/Output Summary (Last 24 hours) at 04/28/2022 0302 Last data filed at 04/28/2022 0006 Gross per 24 hour  Intake --  Output 250 ml  Net -250 ml      04/27/2022   11:00 PM 04/27/2022    6:02 PM 04/20/2022   11:39 AM  Last 3 Weights  Weight (lbs) 201 lb 11.5 oz 200 lb 200 lb 6.4 oz  Weight (kg) 91.5 kg 90.719 kg 90.901 kg     Body mass index is 30.67 kg/m.  General:  NAD.  HEENT: normal Lymph: no adenopathy Neck: no JVD Endocrine:  No thryomegaly Vascular: No carotid bruits; FA pulses 2+ bilaterally without bruits  Cardiac:  irregularly irregular. Clear S1/S2. HSM LLSB.  Lungs:  clear to auscultation bilaterally, no wheezing, rhonchi or rales  Abd: soft, nontender, no hepatomegaly  Ext: no edema Musculoskeletal:  No deformities, BUE and BLE strength normal and equal Skin: warm and dry  Neuro:  CNs 2-12 intact, no focal abnormalities noted Psych:  Normal affect   EKG:  The EKG was personally reviewed (3/22 at 8:37PM) and demonstrates AFRVR with HR 150bpm, IVCD, no acute ischemic changes.   Relevant CV Studies: TTE 01/10/22: 1. Left ventricular ejection fraction, by estimation, is 55 to 60%. The  left ventricle has normal function. The left ventricle has no regional  wall motion abnormalities. There is moderate concentric left ventricular  hypertrophy. Left ventricular  diastolic parameters are consistent with Grade I diastolic dysfunction  (  impaired  relaxation).   2. Right ventricular systolic function is normal. The right ventricular  size is normal.   3. Moderate pericardial effusion. The pericardial effusion is posterior  to the left ventricle.   4. The mitral valve is degenerative. No evidence of mitral valve  regurgitation. No evidence of mitral stenosis.   5. The aortic valve is tricuspid. There is mild calcification of the  aortic valve. There is mild thickening of the aortic valve. Aortic valve  regurgitation is mild. Aortic valve sclerosis is present, with no evidence  of aortic valve stenosis.   Laboratory Data:  High Sensitivity Troponin:   Recent Labs  Lab 04/27/22 1824 04/27/22 2010  TROPONINIHS 30* 32*     Chemistry Recent Labs  Lab 04/27/22 1824  NA 136  K 4.7  CL 106  CO2 22  GLUCOSE 86  BUN 43*  CREATININE 1.97*  CALCIUM 8.9  GFRNONAA 32*  ANIONGAP 8    Recent Labs  Lab 04/27/22 1824  PROT 6.6  ALBUMIN 3.7  AST 24  ALT 14  ALKPHOS 70  BILITOT 0.6   Hematology Recent Labs  Lab 04/27/22 1824 04/28/22 0128  WBC 5.5 6.2  RBC 3.38* 3.25*  HGB 10.5* 10.3*  HCT 32.2* 31.0*  MCV 95.3 95.4  MCH 31.1 31.7  MCHC 32.6 33.2  RDW 13.2 13.1  PLT 161 164   BNP Recent Labs  Lab 04/27/22 1824  BNP 316.9*    DDimer No results for input(s): "DDIMER" in the last 168 hours.  Radiology/Studies:  CT Angio Chest/Abd/Pel for Dissection W and/or Wo Contrast  Result Date: 04/27/2022 CLINICAL DATA:  Acute aortic syndrome suspected. EXAM: CT ANGIOGRAPHY CHEST, ABDOMEN AND PELVIS TECHNIQUE: Non-contrast CT of the chest was initially obtained. Multidetector CT imaging through the chest, abdomen and pelvis was performed using the standard protocol during bolus administration of intravenous contrast. Multiplanar reconstructed images and MIPs were obtained and reviewed to evaluate the vascular anatomy. RADIATION DOSE REDUCTION: This exam was performed according to the departmental dose-optimization program  which includes automated exposure control, adjustment of the mA and/or kV according to patient size and/or use of iterative reconstruction technique. CONTRAST:  25mL OMNIPAQUE IOHEXOL 350 MG/ML SOLN COMPARISON:  CT abdomen pelvis dated 08/30/2021. FINDINGS: CTA CHEST FINDINGS Cardiovascular: There is no cardiomegaly. Small pericardial effusion measuring 7 mm in thickness. Lose three-vessel coronary vascular calcification. Moderate atherosclerotic calcification of the thoracic aorta. No aneurysmal dilatation or dissection. No pulmonary artery embolus identified. Mediastinum/Nodes: No hilar or mediastinal adenopathy. The esophagus is grossly unremarkable. There is a 1 cm right thyroid hypodense nodule. Not clinically significant; no follow-up imaging recommended (ref: J Am Coll Radiol. 2015 Feb;12(2): 143-50).No mediastinal fluid collection. Lungs/Pleura: Small left pleural effusion. There is left lung base atelectasis/scarring. Pneumonia is not excluded. A 3.5 x 1.5 cm ovoid density in the lingula, likely scarring or rounded atelectasis. This was present on the prior chest CT of 2022. There is a 3 mm stable right middle lobe nodule. No pneumothorax. The central airways are patent. Musculoskeletal: Osteopenia with degenerative changes of the spine. Old left rib fractures. No acute osseous pathology. Review of the MIP images confirms the above findings. CTA ABDOMEN AND PELVIS FINDINGS VASCULAR Aorta: Advanced atherosclerotic calcification the abdominal aorta. There is a 2.8 cm infrarenal aortic ectasia. No aneurysmal dilatation or dissection. No periaortic fluid collection. Celiac: Patent without evidence of aneurysm, dissection, vasculitis or significant stenosis. SMA: Atherosclerotic calcification of the SMA. The SMA remains patent Renals: The renal arteries  are patent. IMA: Minimal flow in the IMA. Inflow: There is atherosclerotic calcification of the iliac arteries. 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 The iliac arteries remain patent. Veins: No obvious venous abnormality within the limitations of this arterial phase study. Review of the MIP images confirms the above findings. NON-VASCULAR No intra-abdominal free air or free fluid. Hepatobiliary: The liver is unremarkable. No biliary ductal dilatation. The gallbladder is unremarkable. Pancreas: There is a 2.8 x 1.7 cm hypoenhancing lesion in the distal body and tail of the pancreas which is not characterized this CT but has increased in size since 2023. No significant metabolic activity was seen on the prior PET suggestive of a possible IPMN. Further characterization with MRI, if not previously performed, is advised. No active inflammatory changes. Spleen: Normal in size without focal abnormality. Adrenals/Urinary Tract: The adrenal glands are unremarkable. Mild bilateral renal parenchyma atrophy. There is no hydronephrosis on either side. The visualized ureters and urinary bladder appear unremarkable. Stomach/Bowel: There is sigmoid diverticulosis without active inflammatory changes. Moderate stool throughout the colon. No bowel obstruction or active inflammation. The appendix is not visualized with certainty. No inflammatory changes identified in the right lower quadrant. Lymphatic: No adenopathy. Reproductive: Mildly enlarged prostate gland with median lobe hypertrophy. Other: None Musculoskeletal: Osteopenia with degenerative changes of the spine. No acute osseous pathology. Review of the MIP images confirms the above findings. IMPRESSION: 1. No aortic dissection or aneurysm. 2. Small left pleural effusion with left lung base atelectasis/scarring. Pneumonia is not excluded. 3. Sigmoid diverticulosis without active inflammatory changes. No bowel obstruction. 4. Interval increase in size of the hypoenhancing lesion in the distal body and tail of the pancreas. Further characterization with MRI, if not previously  performed, is advised. Electronically Signed   By: Anner Crete M.D.   On: 04/27/2022 20:06   DG Chest Port 1 View  Result Date: 04/27/2022 CLINICAL DATA:  Chest pain EXAM: PORTABLE CHEST 1 VIEW COMPARISON:  03/13/2022 x-ray and older FINDINGS: Small left effusion versus pleural thickening with some nodularity and opacity, similar to previous. No pneumothorax or edema. Enlarged cardiopericardial silhouette. Calcified aorta. Overlapping cardiac leads. Degenerative changes seen of the spine and shoulders. IMPRESSION: Enlarged heart. Persistent left effusion, adjacent opacity and nodularity. Appearance is similar to previous. Please correlate with previous workup. Electronically Signed   By: Jill Side M.D.   On: 04/27/2022 18:49   {  Assessment and Plan:   #Chest Pain #CAD (s/p PCI-pLAD 2008) #Troponin Elevation Patient presents with atypical, sharp chest pain that is exacerbated by deep breathing and lying flat. CTA C/A/P shows persistent small, circumferential pericardial effusion (previously moderate on 01/10/22 TTE). EKG not suggestive of pericarditis. He has known history of CAD (s/p PCI-pLAD in 2008), however his current pain is distinct from the pain that he experienced prior to PCI in 2008. hsTnT was flat at [facility] (30-->32). Elevated hsTnT that was found on arrival to [facility] [address] occurs in setting of sustained tachyarrhythmia for several hours prior to arrival (160-180 bpm) superimposed on CKD. Patient states that his pain has now resolved. No indication for ACS treatment at this time.  - Repeat TTE - ESR, CRP.  - No indication for ACS treatment at this time.  - ASA 81mg  QD - Continue atorva 20mg  QD - Continue Zetia 10mg  QD  #Atrial Fibrillation #Remote SAH #Hematuria #Chronic Anemia New onset AF noted in [place] ED. C2V=4. Given history of recurrent falls, prior SAH,  hematuria, and chronic anemia in setting of severe dementia, risks inherent to therapeutic AC may outweigh  benefits. Will require further risk benefit discussion with patient and family for shared decision-making prior to initiating long term anticoagulation. Patient is currently NSR.  - TSH, FT4 - TTE, as above.  - As above, further risk-benefit discussion warranted prior to initiating longterm anticoagulation.  - Maintain K>4, Mg>2   For questions or updates, please contact [place] Please consult [email or web address] for contact info under    Signed, Delorse Limber, MD  04/28/2022 3:02 AM

## 2022-04-29 DIAGNOSIS — I4891 Unspecified atrial fibrillation: Secondary | ICD-10-CM | POA: Diagnosis not present

## 2022-04-29 LAB — BASIC METABOLIC PANEL
Anion gap: 8 (ref 5–15)
BUN: 45 mg/dL — ABNORMAL HIGH (ref 8–23)
CO2: 22 mmol/L (ref 22–32)
Calcium: 8.5 mg/dL — ABNORMAL LOW (ref 8.9–10.3)
Chloride: 106 mmol/L (ref 98–111)
Creatinine, Ser: 2.07 mg/dL — ABNORMAL HIGH (ref 0.61–1.24)
GFR, Estimated: 31 mL/min — ABNORMAL LOW (ref >60)
Glucose, Bld: 98 mg/dL (ref 70–99)
Potassium: 4 mmol/L (ref 3.5–5.1)
Sodium: 136 mmol/L (ref 135–145)

## 2022-04-29 NOTE — Progress Notes (Signed)
Cardiologist:  [name]  Subjective:  Denies SSCP, palpitations or Dyspnea Converted to NSR   Objective:  Vitals:   04/28/22 1213 04/28/22 2024 04/28/22 2350 04/29/22 0400  BP: 125/73 (!) 149/77 (!) 150/68 (!) 158/57  Pulse: 80 73 67 63  Resp: 17  16   Temp: 98.4 F (36.9 C) (!) 97.5 F (36.4 C) 99.5 F (37.5 C) (!) 96.6 F (35.9 C)  TempSrc: Oral Oral Oral Axillary  SpO2: 97% 97% 93% 98%  Weight:    88.9 kg  Height:        Intake/Output from previous day:  Intake/Output Summary (Last 24 hours) at 04/29/2022 0830 Last data filed at 04/29/2022 0300 Gross per 24 hour  Intake --  Output 1025 ml  Net -1025 ml    Physical Exam:  Elderly british male  Lungs clear SEM Abdomen benign  Plus one bilateral edema   Lab Results: Basic Metabolic Panel: Recent Labs    04/28/22 0128 04/29/22 0252  NA 134* 136  K 4.5 4.0  CL 101 106  CO2 24 22  GLUCOSE 189* 98  BUN 37* 45*  CREATININE 1.95* 2.07*  CALCIUM 8.5* 8.5*  MG 1.9  --   PHOS 3.2  --    Liver Function Tests: Recent Labs    04/27/22 1824 04/28/22 0128  AST 24 25  ALT 14 14  ALKPHOS 70 65  BILITOT 0.6 0.7  PROT 6.6 5.5*  ALBUMIN 3.7 3.0*   Recent Labs    04/28/22 0122  LIPASE 38   CBC: Recent Labs    04/27/22 1824 04/28/22 0128  WBC 5.5 6.2  HGB 10.5* 10.3*  HCT 32.2* 31.0*  MCV 95.3 95.4  PLT 161 164    Fasting Lipid Panel: Recent Labs    04/28/22 0122  CHOL 110  HDL 50  LDLCALC 52  TRIG 39  CHOLHDL 2.2     Imaging: CT Angio Chest/Abd/Pel for Dissection W and/or Wo Contrast  Result Date: 04/27/2022 CLINICAL DATA:  Acute aortic syndrome suspected. EXAM: CT ANGIOGRAPHY CHEST, ABDOMEN AND PELVIS TECHNIQUE: Non-contrast CT of the chest was initially obtained. Multidetector CT imaging through the chest, abdomen and pelvis was performed using the standard protocol during bolus administration of intravenous contrast. Multiplanar reconstructed images and MIPs were obtained and  reviewed to evaluate the vascular anatomy. RADIATION DOSE REDUCTION: This exam was performed according to the departmental dose-optimization program which includes automated exposure control, adjustment of the mA and/or kV according to patient size and/or use of iterative reconstruction technique. CONTRAST:  42mL OMNIPAQUE IOHEXOL 350 MG/ML SOLN COMPARISON:  CT abdomen pelvis dated 08/30/2021. FINDINGS: CTA CHEST FINDINGS Cardiovascular: There is no cardiomegaly. Small pericardial effusion measuring 7 mm in thickness. Lose three-vessel coronary vascular calcification. Moderate atherosclerotic calcification of the thoracic aorta. No aneurysmal dilatation or dissection. No pulmonary artery embolus identified. Mediastinum/Nodes: No hilar or mediastinal adenopathy. The esophagus is grossly unremarkable. There is a 1 cm right thyroid hypodense nodule. Not clinically significant; no follow-up imaging recommended (ref: J Am Coll Radiol. 2015 Feb;12(2): 143-50).No mediastinal fluid collection. Lungs/Pleura: Small left pleural effusion. There is left lung base atelectasis/scarring. Pneumonia is not excluded. A 3.5 x 1.5 cm ovoid density in the lingula, likely scarring or rounded atelectasis. This was present on the prior chest CT of 2022. There is a 3 mm stable right middle lobe nodule. No pneumothorax. The central airways are patent. Musculoskeletal: Osteopenia with degenerative changes of the spine. Old left rib fractures. No acute osseous pathology. Review  of the MIP images confirms the above findings. CTA ABDOMEN AND PELVIS FINDINGS VASCULAR Aorta: Advanced atherosclerotic calcification the abdominal aorta. There is a 2.8 cm infrarenal aortic ectasia. No aneurysmal dilatation or dissection. No periaortic fluid collection. Celiac: Patent without evidence of aneurysm, dissection, vasculitis or significant stenosis. SMA: Atherosclerotic calcification of the SMA. The SMA remains patent Renals: The renal arteries are patent.  IMA: Minimal flow in the IMA. Inflow: There is atherosclerotic calcification of the iliac arteries. 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 The iliac arteries remain patent. Veins: No obvious venous abnormality within the limitations of this arterial phase study. Review of the MIP images confirms the above findings. NON-VASCULAR No intra-abdominal free air or free fluid. Hepatobiliary: The liver is unremarkable. No biliary ductal dilatation. The gallbladder is unremarkable. Pancreas: There is a 2.8 x 1.7 cm hypoenhancing lesion in the distal body and tail of the pancreas which is not characterized this CT but has increased in size since 2023. No significant metabolic activity was seen on the prior PET suggestive of a possible IPMN. Further characterization with MRI, if not previously performed, is advised. No active inflammatory changes. Spleen: Normal in size without focal abnormality. Adrenals/Urinary Tract: The adrenal glands are unremarkable. Mild bilateral renal parenchyma atrophy. There is no hydronephrosis on either side. The visualized ureters and urinary bladder appear unremarkable. Stomach/Bowel: There is sigmoid diverticulosis without active inflammatory changes. Moderate stool throughout the colon. No bowel obstruction or active inflammation. The appendix is not visualized with certainty. No inflammatory changes identified in the right lower quadrant. Lymphatic: No adenopathy. Reproductive: Mildly enlarged prostate gland with median lobe hypertrophy. Other: None Musculoskeletal: Osteopenia with degenerative changes of the spine. No acute osseous pathology. Review of the MIP images confirms the above findings. IMPRESSION: 1. No aortic dissection or aneurysm. 2. Small left pleural effusion with left lung base atelectasis/scarring. Pneumonia is not excluded. 3. Sigmoid diverticulosis without active inflammatory changes. No bowel obstruction. 4. Interval  increase in size of the hypoenhancing lesion in the distal body and tail of the pancreas. Further characterization with MRI, if not previously performed, is advised. Electronically Signed   By: [name] M.D.   On: 04/27/2022 20:06   DG Chest Port 1 View  Result Date: 04/27/2022 CLINICAL DATA:  Chest pain EXAM: PORTABLE CHEST 1 VIEW COMPARISON:  03/13/2022 x-ray and older FINDINGS: Small left effusion versus pleural thickening with some nodularity and opacity, similar to previous. No pneumothorax or edema. Enlarged cardiopericardial silhouette. Calcified aorta. Overlapping cardiac leads. Degenerative changes seen of the spine and shoulders. IMPRESSION: Enlarged heart. Persistent left effusion, adjacent opacity and nodularity. Appearance is similar to previous. Please correlate with previous workup. Electronically Signed   By: Jill Side M.D.   On: 04/27/2022 18:49    Cardiac Studies:  ECG: SR LAD no acute ST changes    Telemetry:  NSR   Echo: peding  EF 55-60% on TE 01/10/22   Medications:    aspirin EC  81 mg Oral Daily   atorvastatin  20 mg Oral Daily   doxazosin  2 mg Oral Daily   enoxaparin (LOVENOX) injection  30 mg Subcutaneous Q24H   escitalopram  10 mg Oral Daily   ezetimibe  10 mg Oral Daily   fluticasone furoate-vilanterol  1 puff Inhalation Daily   furosemide  40 mg Oral Daily   lisinopril  20 mg Oral Daily   memantine  5 mg Oral QHS  metoprolol tartrate  12.5 mg Oral BID      Assessment/Plan:  [name] is a 89 y.o. male with a hx of Alzheimer's dementia, HTN, HLD, T2DM (A1c 6% 12/2021), CKD, traumatic SAH (2021), CAD (DES-pLAD 2008), and HFpEF who is being seen today for the evaluation of chest pain at the request of Hospital Medicine.   PAF:  converted to NSR continue lopressor add low dose amiodarone to try and prevent recurrence given no anticoagulation No anticoagulation with recurrent falls  history of SAH, hematuria  chronic anemia and  dementia SEMI no chest pain in setting of rapid afiib type 2 ischemia with age lack of SSCP and CRF avoid cath TTE pending  HLD:  continue statin HTN:  stable on ACE   Jenkins Rouge 04/29/2022, 8:30 AM

## 2022-04-29 NOTE — Progress Notes (Signed)
PROGRESS NOTE    [name]  [id number] DOB: [birth year] DOA: 04/27/2022 PCP: [name], MD     Brief Narrative:  [name] is a 89 y.o. male with medical history significant for coronary artery disease status post PCI with stent placement, HFpEF 55 to 60% with grade 1 diastolic dysfunction, who initially presented to [facility] ED with complaints of chest pain.  Centrally located, sharp, non radiating, worse with deep breath and present at rest.  Associated with shortness of breath with minimal exertion.   While in the ED, lab studies are notable for elevated high-sensitivity troponin 30, uptrended to 32, flat.  EDP discussed the case with cardiology on-call who recommended admission to hospitalist service for further workup.   Upon arrival to [facility] telemetry cardiac unit, the patient complaints of persistent sharp centrally located chest pain.  In Afib with RVR on diltiazem drip.  Po Lopressor added with some improvement of the heart rate.  Denies palpitations even when in Afib rvr.  Cardiology consulted.  New events last 24 hours / Subjective: Patient without any new complaints today.  In good spirits.  Eating breakfast in bed.  Spouse at bedside states that patient had episode of confusion/agitation overnight.  Assessment & Plan:   Principal Problem:   Atrial fibrillation with RVR (HCC) Active Problems:   Benign prostatic hyperplasia   Essential hypertension   Stage 3b chronic kidney disease (HCC)   CAD (coronary artery disease)   History of SAH (subarachnoid hemorrhage)   A-fib RVR -Discussed with patient and wife at bedside, patient remains poor candidate for anticoagulation due to fall risk, history of subdural hematoma, hematuria -Aspirin, Lopressor -Cardiology following  Demand ischemia -In setting of A-fib RVR -Echocardiogram pending  Acute on chronic diastolic heart failure -Echo pending -Lasix   CKD stage IIIb -Baseline creatinine  1.97 -Stable  Hypertension -Lisinopril  Hyperlipidemia -Lipitor, Zetia  Anxiety/depression -Escitalopram  Memory loss with sundowning -Namenda -Delirium precaution  Obesity -BMI 30.67  Lesion in tail of pancreas -MRI abdomen in 2022 revealed cystic lesion in pancreatic tail -PET scan in 2022 revealed benign inflammatory etiology of left upper lobe chest lesion, no substantial hypermetabolic activity associated with cystic lesion of pancreatic tail -Follow-up outpatient  DVT prophylaxis:  enoxaparin (LOVENOX) injection 30 mg Start: 04/28/22 1000  Code Status: Full Family Communication: Wife at bedside  Disposition Plan:  Status is: Inpatient Remains inpatient appropriate because: Echo pending   Consultants:  Cardiology    Antimicrobials:  Anti-infectives (From admission, onward)    None        Objective: Vitals:   04/28/22 2350 04/29/22 0400 04/29/22 0903 04/29/22 0949  BP: (!) 150/68 (!) 158/57  128/71  Pulse: 67 63  69  Resp: 16     Temp: 99.5 F (37.5 C) (!) 96.6 F (35.9 C)    TempSrc: Oral Axillary    SpO2: 93% 98% 96% 97%  Weight:  88.9 kg    Height:        Intake/Output Summary (Last 24 hours) at 04/29/2022 1240 Last data filed at 04/29/2022 0945 Gross per 24 hour  Intake 240 ml  Output 1025 ml  Net -785 ml    Filed Weights   04/27/22 2300 04/28/22 0323 04/29/22 0400  Weight: 91.5 kg 91.5 kg 88.9 kg    Examination:  General exam: Appears calm and comfortable  Respiratory system: Clear to auscultation. Respiratory effort normal. No respiratory distress. No conversational dyspnea.  Cardiovascular system: S1 & S2 heard, RRR.  No murmurs. No pedal edema. Gastrointestinal system: Abdomen is nondistended, soft and nontender. Normal bowel sounds heard. Central nervous system: Alert and oriented. No focal neurological deficits. Speech clear.  Extremities: Symmetric in appearance  Skin: No rashes, lesions or ulcers on exposed skin   Psychiatry: Judgement and insight appear normal. Mood & affect appropriate.   Data Reviewed: I have personally reviewed following labs and imaging studies  CBC: Recent Labs  Lab 04/27/22 1824 04/28/22 0128  WBC 5.5 6.2  HGB 10.5* 10.3*  HCT 32.2* 31.0*  MCV 95.3 95.4  PLT 161 [id number]    Basic Metabolic Panel: Recent Labs  Lab 04/27/22 1824 04/28/22 0128 04/29/22 0252  NA 136 134* 136  K 4.7 4.5 4.0  CL 106 101 106  CO2 22 24 22   GLUCOSE 86 189* 98  BUN 43* 37* 45*  CREATININE 1.97* 1.95* 2.07*  CALCIUM 8.9 8.5* 8.5*  MG  --  1.9  --   PHOS  --  3.2  --     GFR: Estimated Creatinine Clearance: 27.8 mL/min (A) (by C-G formula based on SCr of 2.07 mg/dL (H)). Liver Function Tests: Recent Labs  Lab 04/27/22 1824 04/28/22 0128  AST 24 25  ALT 14 14  ALKPHOS 70 65  BILITOT 0.6 0.7  PROT 6.6 5.5*  ALBUMIN 3.7 3.0*    Recent Labs  Lab 04/28/22 0122  LIPASE 38    No results for input(s): "AMMONIA" in the last 168 hours. Coagulation Profile: No results for input(s): "INR", "PROTIME" in the last 168 hours. Cardiac Enzymes: No results for input(s): "CKTOTAL", "CKMB", "CKMBINDEX", "TROPONINI" in the last 168 hours. BNP (last 3 results) No results for input(s): "PROBNP" in the last 8760 hours. HbA1C: No results for input(s): "HGBA1C" in the last 72 hours. CBG: No results for input(s): "GLUCAP" in the last 168 hours. Lipid Profile: Recent Labs    04/28/22 0122  CHOL 110  HDL 50  LDLCALC 52  TRIG 39  CHOLHDL 2.2    Thyroid Function Tests: No results for input(s): "TSH", "T4TOTAL", "FREET4", "T3FREE", "THYROIDAB" in the last 72 hours. Anemia Panel: No results for input(s): "VITAMINB12", "FOLATE", "FERRITIN", "TIBC", "IRON", "RETICCTPCT" in the last 72 hours. Sepsis Labs: No results for input(s): "PROCALCITON", "LATICACIDVEN" in the last 168 hours.  No results found for this or any previous visit (from the past 240 hour(s)).    Radiology Studies: CT  Angio Chest/Abd/Pel for Dissection W and/or Wo Contrast  Result Date: 04/27/2022 CLINICAL DATA:  Acute aortic syndrome suspected. EXAM: CT ANGIOGRAPHY CHEST, ABDOMEN AND PELVIS TECHNIQUE: Non-contrast CT of the chest was initially obtained. Multidetector CT imaging through the chest, abdomen and pelvis was performed using the standard protocol during bolus administration of intravenous contrast. Multiplanar reconstructed images and MIPs were obtained and reviewed to evaluate the vascular anatomy. RADIATION DOSE REDUCTION: This exam was performed according to the departmental dose-optimization program which includes automated exposure control, adjustment of the mA and/or kV according to patient size and/or use of iterative reconstruction technique. CONTRAST:  64mL OMNIPAQUE IOHEXOL 350 MG/ML SOLN COMPARISON:  CT abdomen pelvis dated 08/30/2021. FINDINGS: CTA CHEST FINDINGS Cardiovascular: There is no cardiomegaly. Small pericardial effusion measuring 7 mm in thickness. Lose three-vessel coronary vascular calcification. Moderate atherosclerotic calcification of the thoracic aorta. No aneurysmal dilatation or dissection. No pulmonary artery embolus identified. Mediastinum/Nodes: No hilar or mediastinal adenopathy. The esophagus is grossly unremarkable. There is a 1 cm right thyroid hypodense nodule. Not clinically significant; no follow-up imaging recommended (ref: J  Am Coll Radiol. 2015 Feb;12(2): 143-50).No mediastinal fluid collection. Lungs/Pleura: Small left pleural effusion. There is left lung base atelectasis/scarring. Pneumonia is not excluded. A 3.5 x 1.5 cm ovoid density in the lingula, likely scarring or rounded atelectasis. This was present on the prior chest CT of 2022. There is a 3 mm stable right middle lobe nodule. No pneumothorax. The central airways are patent. Musculoskeletal: Osteopenia with degenerative changes of the spine. Old left rib fractures. No acute osseous pathology. Review of the MIP  images confirms the above findings. CTA ABDOMEN AND PELVIS FINDINGS VASCULAR Aorta: Advanced atherosclerotic calcification the abdominal aorta. There is a 2.8 cm infrarenal aortic ectasia. No aneurysmal dilatation or dissection. No periaortic fluid collection. Celiac: Patent without evidence of aneurysm, dissection, vasculitis or significant stenosis. SMA: Atherosclerotic calcification of the SMA. The SMA remains patent Renals: The renal arteries are patent. IMA: Minimal flow in the IMA. Inflow: There is atherosclerotic calcification of the iliac arteries. 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 The iliac arteries remain patent. Veins: No obvious venous abnormality within the limitations of this arterial phase study. Review of the MIP images confirms the above findings. NON-VASCULAR No intra-abdominal free air or free fluid. Hepatobiliary: The liver is unremarkable. No biliary ductal dilatation. The gallbladder is unremarkable. Pancreas: There is a 2.8 x 1.7 cm hypoenhancing lesion in the distal body and tail of the pancreas which is not characterized this CT but has increased in size since 2023. No significant metabolic activity was seen on the prior PET suggestive of a possible IPMN. Further characterization with MRI, if not previously performed, is advised. No active inflammatory changes. Spleen: Normal in size without focal abnormality. Adrenals/Urinary Tract: The adrenal glands are unremarkable. Mild bilateral renal parenchyma atrophy. There is no hydronephrosis on either side. The visualized ureters and urinary bladder appear unremarkable. Stomach/Bowel: There is sigmoid diverticulosis without active inflammatory changes. Moderate stool throughout the colon. No bowel obstruction or active inflammation. The appendix is not visualized with certainty. No inflammatory changes identified in the right lower quadrant. Lymphatic: No adenopathy. Reproductive: Mildly  enlarged prostate gland with median lobe hypertrophy. Other: None Musculoskeletal: Osteopenia with degenerative changes of the spine. No acute osseous pathology. Review of the MIP images confirms the above findings. IMPRESSION: 1. No aortic dissection or aneurysm. 2. Small left pleural effusion with left lung base atelectasis/scarring. Pneumonia is not excluded. 3. Sigmoid diverticulosis without active inflammatory changes. No bowel obstruction. 4. Interval increase in size of the hypoenhancing lesion in the distal body and tail of the pancreas. Further characterization with MRI, if not previously performed, is advised. Electronically Signed   By: Anner Crete M.D.   On: 04/27/2022 20:06   DG Chest Port 1 View  Result Date: 04/27/2022 CLINICAL DATA:  Chest pain EXAM: PORTABLE CHEST 1 VIEW COMPARISON:  03/13/2022 x-ray and older FINDINGS: Small left effusion versus pleural thickening with some nodularity and opacity, similar to previous. No pneumothorax or edema. Enlarged cardiopericardial silhouette. Calcified aorta. Overlapping cardiac leads. Degenerative changes seen of the spine and shoulders. IMPRESSION: Enlarged heart. Persistent left effusion, adjacent opacity and nodularity. Appearance is similar to previous. Please correlate with previous workup. Electronically Signed   By: Jill Side M.D.   On: 04/27/2022 18:49      Scheduled Meds:  aspirin EC  81 mg Oral Daily   atorvastatin  20 mg Oral Daily   doxazosin  2 mg Oral Daily   enoxaparin (LOVENOX)  injection  30 mg Subcutaneous Q24H   escitalopram  10 mg Oral Daily   ezetimibe  10 mg Oral Daily   fluticasone furoate-vilanterol  1 puff Inhalation Daily   furosemide  40 mg Oral Daily   lisinopril  20 mg Oral Daily   memantine  5 mg Oral QHS   metoprolol tartrate  12.5 mg Oral BID   Continuous Infusions:   LOS: 2 days   Time spent: 20 minutes   Dessa Phi, DO [facility] 04/29/2022, 12:40 PM   Available via Epic secure  chat 7am-7pm After these hours, please refer to coverage provider listed on [email or web address]

## 2022-04-30 ENCOUNTER — Inpatient Hospital Stay (HOSPITAL_COMMUNITY): Payer: PPO

## 2022-04-30 DIAGNOSIS — R7989 Other specified abnormal findings of blood chemistry: Secondary | ICD-10-CM

## 2022-04-30 DIAGNOSIS — R079 Chest pain, unspecified: Secondary | ICD-10-CM | POA: Diagnosis not present

## 2022-04-30 DIAGNOSIS — I4891 Unspecified atrial fibrillation: Secondary | ICD-10-CM | POA: Diagnosis not present

## 2022-04-30 LAB — BASIC METABOLIC PANEL
Anion gap: 9 (ref 5–15)
BUN: 55 mg/dL — ABNORMAL HIGH (ref 8–23)
CO2: 22 mmol/L (ref 22–32)
Calcium: 8.2 mg/dL — ABNORMAL LOW (ref 8.9–10.3)
Chloride: 103 mmol/L (ref 98–111)
Creatinine, Ser: 2.34 mg/dL — ABNORMAL HIGH (ref 0.61–1.24)
GFR, Estimated: 26 mL/min — ABNORMAL LOW (ref >60)
Glucose, Bld: 95 mg/dL (ref 70–99)
Potassium: 4.5 mmol/L (ref 3.5–5.1)
Sodium: 134 mmol/L — ABNORMAL LOW (ref 135–145)

## 2022-04-30 LAB — ECHOCARDIOGRAM LIMITED
Height: 68 in
S' Lateral: 3.7 cm
Weight: 3107.6 oz

## 2022-04-30 LAB — HEMOGLOBIN A1C
Hgb A1c MFr Bld: 6.2 % — ABNORMAL HIGH (ref 4.8–5.6)
Mean Plasma Glucose: 131 mg/dL

## 2022-04-30 LAB — TSH: TSH: 1.843 u[IU]/mL (ref 0.350–4.500)

## 2022-04-30 MED ORDER — DILTIAZEM HCL-DEXTROSE 125-5 MG/125ML-% IV SOLN (PREMIX)
5.0000 mg/h | INTRAVENOUS | Status: DC
  Administered 2022-04-30: 5 mg/h via INTRAVENOUS

## 2022-04-30 MED ORDER — SODIUM CHLORIDE 0.9 % IV BOLUS
1000.0000 mL | Freq: Once | INTRAVENOUS | Status: AC
  Administered 2022-04-30: 1000 mL via INTRAVENOUS

## 2022-04-30 MED ORDER — METOPROLOL TARTRATE 25 MG PO TABS: 25.0000 mg | ORAL_TABLET | Freq: Two times a day (BID) | ORAL | Status: DC

## 2022-04-30 NOTE — Progress Notes (Signed)
RN notified by telemetry that heart rate was 37. RN went bedside. Pt had syncopal episode after using the bathroom. Mobility specialist present and got patient into chair without falling. BP 0000000 systolic. IL iv fluid bolus infusing. Cardiology and hospitalist bedside. Current blood pressure 86/53 and heart rate 58.

## 2022-04-30 NOTE — Progress Notes (Signed)
Patient converted from normal sinus rhythm to Afib with RVR. HR in 130-140s. Patient denies any chest pains. Vitals are WDL. Patient getting EKG. Sarajane Jews MD notified. No new orders at this time.

## 2022-04-30 NOTE — Progress Notes (Addendum)
PROGRESS NOTE    [name]  [id number] DOB: February 12, 1933 DOA: 04/27/2022 PCP: [name], MD     Brief Narrative:  [name] is a 89 y.o. male with medical history significant for coronary artery disease status post PCI with stent placement, HFpEF 55 to 60% with grade 1 diastolic dysfunction, who initially presented to [facility] ED with complaints of chest pain.  Centrally located, sharp, non radiating, worse with deep breath and present at rest.  Associated with shortness of breath with minimal exertion.   While in the ED, lab studies are notable for elevated high-sensitivity troponin 30, uptrended to 32, flat.  EDP discussed the case with cardiology on-call who recommended admission to hospitalist service for further workup.   Upon arrival to [facility] telemetry cardiac unit, the patient complaints of persistent sharp centrally located chest pain.  In Afib with RVR on diltiazem drip.  Po Lopressor added with some improvement of the heart rate.  Denies palpitations even when in Afib rvr.  Cardiology consulted.  New events last 24 hours / Subjective: Early this morning, patient went into A-fib RVR.  He was started on PO Lopressor and Cardizem drip.  On my initial evaluation, patient was sitting in bed, eating breakfast without any complaints.  He was on IV Cardizem gtt 5 mg/hr and heart rate in the 130s.  After my initial evaluation, I was notified by RN that patient was having low BP.  After using the bathroom, patient stood up and brady down, had a syncopal episode.  He did not sustain any injuries as staff was present to hold him up.  Patient was reevaluated at bedside with cardiology.  Patient was laying in bed, alert and oriented to his baseline.  Had some complaints of lightheadedness.  Heart rate in the 50s, normal sinus rhythm, blood pressure with SBP 80s.  Assessment & Plan:   Principal Problem:   Atrial fibrillation with RVR (HCC) Active Problems:   Benign  prostatic hyperplasia   Essential hypertension   Stage 3b chronic kidney disease (HCC)   CAD (coronary artery disease)   History of SAH (subarachnoid hemorrhage)   A-fib RVR -Discussed with patient and wife at bedside, patient remains poor candidate for anticoagulation due to fall risk, history of subdural hematoma, hematuria -Aspirin -Cardiology following.  Discussed with Dr. Margaretann Loveless  Demand ischemia -In setting of A-fib RVR -Echocardiogram pending  Acute on chronic diastolic heart failure -Holding lasix  -Echo pending  AKI on CKD stage IIIb -Baseline creatinine 1.97 -Holding lasix and lisinopril. Getting IVF bolus today due to hypotension   Hypertension -Hold lisinopril  Hyperlipidemia -Lipitor, Zetia  Anxiety/depression -Escitalopram  Memory loss with sundowning -Namenda -Delirium precaution  Obesity -BMI 30.67  Lesion in tail of pancreas -MRI abdomen in 2022 revealed cystic lesion in pancreatic tail -PET scan in 2022 revealed benign inflammatory etiology of left upper lobe chest lesion, no substantial hypermetabolic activity associated with cystic lesion of pancreatic tail -Follow-up outpatient  DVT prophylaxis:  enoxaparin (LOVENOX) injection 30 mg Start: 04/28/22 1000  Code Status: Full Family Communication: Wife at bedside  Disposition Plan:  Status is: Inpatient Remains inpatient appropriate because: A Fib RVR and hypotension. Echo pending   Consultants:  Cardiology    Antimicrobials:  Anti-infectives (From admission, onward)    None        Objective: Vitals:   04/30/22 0500 04/30/22 0806 04/30/22 0905 04/30/22 1257  BP: 127/67 115/81 (!) 82/57 (!) 94/45  Pulse: 70 (!) 140  Resp: 16     Temp: 98.2 F (36.8 C)     TempSrc: Oral     SpO2: 97%     Weight: 88.1 kg     Height:        Intake/Output Summary (Last 24 hours) at 04/30/2022 1325 Last data filed at 04/30/2022 0800 Gross per 24 hour  Intake 240 ml  Output 1016 ml  Net  -776 ml    Filed Weights   04/28/22 0323 04/29/22 0400 04/30/22 0500  Weight: 91.5 kg 88.9 kg 88.1 kg    Examination: (Initial evaluation) General exam: Appears calm and comfortable  Respiratory system: Clear to auscultation. Respiratory effort normal. No respiratory distress. No conversational dyspnea.  Cardiovascular system: S1 & S2 heard, tachycardic, irregular rhythm. No murmurs.  Gastrointestinal system: Abdomen is nondistended, soft and nontender. Normal bowel sounds heard. Central nervous system: Alert and oriented. No focal neurological deficits. Speech clear.  Extremities: Symmetric in appearance  Skin: No rashes, lesions or ulcers on exposed skin  Psychiatry: Judgement and insight appear normal. Mood & affect appropriate.   Data Reviewed: I have personally reviewed following labs and imaging studies  CBC: Recent Labs  Lab 04/27/22 1824 04/28/22 0128  WBC 5.5 6.2  HGB 10.5* 10.3*  HCT 32.2* 31.0*  MCV 95.3 95.4  PLT 161 [id number]    Basic Metabolic Panel: Recent Labs  Lab 04/27/22 1824 04/28/22 0128 04/29/22 0252 04/30/22 0204  NA 136 134* 136 134*  K 4.7 4.5 4.0 4.5  CL 106 101 106 103  CO2 22 24 22 22   GLUCOSE 86 189* 98 95  BUN 43* 37* 45* 55*  CREATININE 1.97* 1.95* 2.07* 2.34*  CALCIUM 8.9 8.5* 8.5* 8.2*  MG  --  1.9  --   --   PHOS  --  3.2  --   --     GFR: Estimated Creatinine Clearance: 24.5 mL/min (A) (by C-G formula based on SCr of 2.34 mg/dL (H)). Liver Function Tests: Recent Labs  Lab 04/27/22 1824 04/28/22 0128  AST 24 25  ALT 14 14  ALKPHOS 70 65  BILITOT 0.6 0.7  PROT 6.6 5.5*  ALBUMIN 3.7 3.0*    Recent Labs  Lab 04/28/22 0122  LIPASE 38    No results for input(s): "AMMONIA" in the last 168 hours. Coagulation Profile: No results for input(s): "INR", "PROTIME" in the last 168 hours. Cardiac Enzymes: No results for input(s): "CKTOTAL", "CKMB", "CKMBINDEX", "TROPONINI" in the last 168 hours. BNP (last 3 results) No results  for input(s): "PROBNP" in the last 8760 hours. HbA1C: Recent Labs    04/28/22 0122  HGBA1C 6.2*   CBG: No results for input(s): "GLUCAP" in the last 168 hours. Lipid Profile: Recent Labs    04/28/22 0122  CHOL 110  HDL 50  LDLCALC 52  TRIG 39  CHOLHDL 2.2    Thyroid Function Tests: Recent Labs    04/30/22 0202  TSH 1.843   Anemia Panel: No results for input(s): "VITAMINB12", "FOLATE", "FERRITIN", "TIBC", "IRON", "RETICCTPCT" in the last 72 hours. Sepsis Labs: No results for input(s): "PROCALCITON", "LATICACIDVEN" in the last 168 hours.  No results found for this or any previous visit (from the past 240 hour(s)).    Radiology Studies: ECHOCARDIOGRAM LIMITED  Result Date: 04/30/2022    ECHOCARDIOGRAM LIMITED REPORT   Patient Name:   [name] Date of Exam: 04/30/2022 Medical Rec #:  [id number]          Height:  68.0 in Accession #:    [id number]         Weight:       194.2 lb Date of Birth:  September 01, 1933          BSA:          2.018 m Patient Age:    65 years           BP:           87/52 mmHg Patient Gender: M                  HR:           56 bpm. Exam Location:  Inpatient Procedure: Limited Echo and Limited Color Doppler Indications:     Elevated Troponin  History:         Patient has prior history of Echocardiogram examinations, most                  recent 01/10/2022. CAD, Arrythmias:Atrial Fibrillation; Risk                  Factors:Hypertension, Diabetes and Dyslipidemia. CKD, stage 3.  Sonographer:     Ronny Flurry Referring Phys:  [id number] [place] Diagnosing Phys: Glori Bickers MD IMPRESSIONS  1. The LV endocardium is not well visualized in any images. EF appears grossly normal.. Left ventricular ejection fraction, by estimation, is 55 to 60%. The left ventricle has normal function. Left ventricular endocardial border not optimally defined to  evaluate regional wall motion. There is moderate concentric left ventricular hypertrophy.  2. Right  ventricular systolic function is normal.  3. A small pericardial effusion is present. The pericardial effusion is posterior to the left ventricle. There is no evidence of cardiac tamponade.  4. Trivial mitral valve regurgitation.  5. The aortic valve is tricuspid. There is mild calcification of the aortic valve. Aortic valve regurgitation is trivial.  6. Technically limited study due to ppor sound wave transmission. FINDINGS  Left Ventricle: The LV endocardium is not well visualized in any images. EF appears grossly normal. Left ventricular ejection fraction, by estimation, is 55 to 60%. The left ventricle has normal function. Left ventricular endocardial border not optimally defined to evaluate regional wall motion. There is moderate concentric left ventricular hypertrophy. Right Ventricle: Right ventricular systolic function is normal. Pericardium: A small pericardial effusion is present. The pericardial effusion is posterior to the left ventricle. There is no evidence of cardiac tamponade. Mitral Valve: Mild mitral annular calcification. Trivial mitral valve regurgitation. Tricuspid Valve: The tricuspid valve is not well visualized. Tricuspid valve regurgitation is trivial. Aortic Valve: The aortic valve is tricuspid. There is mild calcification of the aortic valve. Aortic valve regurgitation is trivial. LEFT VENTRICLE PLAX 2D LVIDd:         4.95 cm LVIDs:         3.70 cm LV PW:         1.20 cm LV IVS:        1.50 cm LVOT diam:     2.30 cm LVOT Area:     4.15 cm  LEFT ATRIUM         Index LA diam:    3.50 cm 1.73 cm/m   AORTA Ao Root diam: 3.70 cm Ao Asc diam:  3.50 cm  SHUNTS Systemic Diam: 2.30 cm Glori Bickers MD Electronically signed by Glori Bickers MD Signature Date/Time: 04/30/2022/12:41:27 PM    Final (Updated)       Scheduled Meds:  aspirin EC  81 mg Oral Daily   atorvastatin  20 mg Oral Daily   doxazosin  2 mg Oral Daily   enoxaparin (LOVENOX) injection  30 mg Subcutaneous Q24H    escitalopram  10 mg Oral Daily   ezetimibe  10 mg Oral Daily   fluticasone furoate-vilanterol  1 puff Inhalation Daily   memantine  5 mg Oral QHS   Continuous Infusions:   LOS: 3 days   Time spent: 35 minutes   Dessa Phi, DO [facility] 04/30/2022, 1:25 PM   Available via Epic secure chat 7am-7pm After these hours, please refer to coverage provider listed on [email or web address]

## 2022-04-30 NOTE — Progress Notes (Addendum)
Rounding Note    Patient Name: [name] Date of Encounter: 04/30/2022  [place] Cardiologist: Sherren Mocha, MD   Subjective   Patient went back into rapid atrial fibrillation this morning with rates in the 130s to 140s. He has severe dementia and is pleasantly confused. He states he feels washed out this morning and can feel his heart beating fast. His wife also reports that he started complaining of some pain in his upper back/neck and some pleuritic chest pain when he went back into rapid atrial fibrillation. However, this is not as severe as the pain that brought him in. No shortness of breath.  Inpatient Medications    Scheduled Meds:  aspirin EC  81 mg Oral Daily   atorvastatin  20 mg Oral Daily   doxazosin  2 mg Oral Daily   enoxaparin (LOVENOX) injection  30 mg Subcutaneous Q24H   escitalopram  10 mg Oral Daily   ezetimibe  10 mg Oral Daily   fluticasone furoate-vilanterol  1 puff Inhalation Daily   furosemide  40 mg Oral Daily   lisinopril  20 mg Oral Daily   memantine  5 mg Oral QHS   metoprolol tartrate  12.5 mg Oral BID   Continuous Infusions:  PRN Meds: acetaminophen, melatonin, polyethylene glycol, prochlorperazine   Vital Signs    Vitals:   04/29/22 1258 04/29/22 1958 04/29/22 2000 04/30/22 0500  BP: (!) 114/57 (!) 113/54 (!) 113/54 127/67  Pulse: 66 75 75 70  Resp: 16 16  16   Temp: 98.1 F (36.7 C) 98.1 F (36.7 C)  98.2 F (36.8 C)  TempSrc: Oral Oral  Oral  SpO2: 96% 97% 96% 97%  Weight:    88.1 kg  Height:        Intake/Output Summary (Last 24 hours) at 04/30/2022 [id number] Last data filed at 04/30/2022 [id number] Gross per 24 hour  Intake 240 ml  Output 666 ml  Net -426 ml      04/30/2022    5:00 AM 04/29/2022    4:00 AM 04/28/2022    3:23 AM  Last 3 Weights  Weight (lbs) 194 lb 3.6 oz 195 lb 14.4 oz 201 lb 11.5 oz  Weight (kg) 88.1 kg 88.86 kg 91.5 kg      Telemetry    Went back into atrial fibrillation with RVR this  morning around 6:25am. Rates in the 130s to 140s. - Personally Reviewed  ECG    Atrial fibrillation, rate 139 bpm, with non-specific ST/T wave changes. - Personally Reviewed  Physical Exam   GEN: Caucasian male resting comfortably in no acute distress.   Neck: No JVD. Cardiac: Tachycardic with irregularly irregular rhythm. No murmurs, rubs, or gallops. Radial pulses 2+ and equal bilaterally. Respiratory: Clear to auscultation bilaterally. No wheezes, rhonchi, or rales. GI: Soft and non-distended. Mildly tender to palpation.  MS: No lower extremity edema. No deformity. Skin: Warm and dry. Neuro:  No focal deficits. Psych: Normal affect. Pleasantly confused (underlying dementia).  Labs    High Sensitivity Troponin:   Recent Labs  Lab 04/27/22 1824 04/27/22 2010 04/28/22 0122 04/28/22 0325  TROPONINIHS 30* 32* 640* 1,114*     Chemistry Recent Labs  Lab 04/27/22 1824 04/28/22 0128 04/29/22 0252 04/30/22 0204  NA 136 134* 136 134*  K 4.7 4.5 4.0 4.5  CL 106 101 106 103  CO2 22 24 22 22   GLUCOSE 86 189* 98 95  BUN 43* 37* 45* 55*  CREATININE 1.97* 1.95* 2.07* 2.34*  CALCIUM 8.9 8.5* 8.5* 8.2*  MG  --  1.9  --   --   PROT 6.6 5.5*  --   --   ALBUMIN 3.7 3.0*  --   --   AST 24 25  --   --   ALT 14 14  --   --   ALKPHOS 70 65  --   --   BILITOT 0.6 0.7  --   --   GFRNONAA 32* 33* 31* 26*  ANIONGAP 8 9 8 9     Lipids  Recent Labs  Lab 04/28/22 0122  CHOL 110  TRIG 39  HDL 50  LDLCALC 52  CHOLHDL 2.2    Hematology Recent Labs  Lab 04/27/22 1824 04/28/22 0128  WBC 5.5 6.2  RBC 3.38* 3.25*  HGB 10.5* 10.3*  HCT 32.2* 31.0*  MCV 95.3 95.4  MCH 31.1 31.7  MCHC 32.6 33.2  RDW 13.2 13.1  PLT 161 164   Thyroid No results for input(s): "TSH", "FREET4" in the last 168 hours.  BNP Recent Labs  Lab 04/27/22 1824  BNP 316.9*    DDimer No results for input(s): "DDIMER" in the last 168 hours.   Radiology    No results found.  Cardiac Studies    Echocardiogram 01/10/2022: Impressions: 1. Left ventricular ejection fraction, by estimation, is 55 to 60%. The  left ventricle has normal function. The left ventricle has no regional  wall motion abnormalities. There is moderate concentric left ventricular  hypertrophy. Left ventricular  diastolic parameters are consistent with Grade I diastolic dysfunction  (impaired relaxation).   2. Right ventricular systolic function is normal. The right ventricular  size is normal.   3. Moderate pericardial effusion. The pericardial effusion is posterior  to the left ventricle.   4. The mitral valve is degenerative. No evidence of mitral valve  regurgitation. No evidence of mitral stenosis.   5. The aortic valve is tricuspid. There is mild calcification of the  aortic valve. There is mild thickening of the aortic valve. Aortic valve  regurgitation is mild. Aortic valve sclerosis is present, with no evidence  of aortic valve stenosis.   Comparison(s): Changes from prior study are noted. LVEF unchanged.  Moderate pericardial effusion posterior to LV, but no signs of tamponade.    Patient Profile     89 y.o. male with a history of CAD s/p DES to proximal LAD in 2008, chronic HFpEF, mild bilateral carotid artery stenosis on dopplers in 11/2018, hypertension, hyperlipidemia, type 2 diabetes mellitus,  CKD stage III, traumatic SAH in 2021, and Alzheimer's dementia who presented on 04/27/2022 with chest pain and was found to be in new onset atrial fibrillation with RVR.  Assessment & Plan    New Onset Atrial Fibrillation Patient presented with chest pain. Initial EKG in the ED showed normal sinus rhythm but then he went into rapid atrial fibrillation with rates in the 150s. He was started on IV Diltiazem and converted to sinus arrhythmia. However, he converted back to atrial fibrillation earlier this morning with rates in the 130s.  - Rates currently in the 130s to 140s. - Potassium and magnesium  within normal limits. Will check TSH for thoroughness.  - Echo has been ordered but has not been done yet. Will try to get heart rates more controlled before Echo is completed. - Will restart IV Diltiazem and increase Lopressor to 25mg  twice daily.  - CHA2DS2-VASc = 6 (CAD, CHF, HTN, DM, age x2) . However, he is not felt  to be an anticoagulation candidate given history of SAH, hematuria, chronic anemia, recurrent falls, and severe dementia.  - No plans for DCCV at this time since we cannot anticoagulate. Will try adjusting rate control agents as above. Hopefully this will convert him back to sinus rhythm. If not, may need to consider Amiodarone to help maintain sinus rhythm.   Chest Pain Likely Demand Ischemia CAD Patient has a history of CAD with prior DES to LAD in 2008. He presented with chest pain. Initial EKG showed normal sinus rhythm with no acute ST/T changes. He was then noted to go into rapid atrial fibrillation. High-sensitivity elevated at 32 >> 640 >> 1,114. Chest CTA was negative for aortic dissection/ aneurysm. - He reports some pleuritic chest pain as well as some pain in upper back/neck this morning after going back into atrial fibrillation. However, not as severe to the symptoms that brought him in. - Echo pending. - Continue aspirin, beta-blocker, and statin. - Symptoms and troponin elevation currently felt to most likely be due to demand ischemia in setting of rapid atrial fibrillation. However, cannot rule out ischemia. He is not a good cardiac cath candidate at this time given advanced age, severe dementia, and CKD. Will wait for Echo results as above.  Chronic HFpEF Last Echo in 01/2022 showed LVEF of 55-60% with normal wall motion, moderate LVH, and grade 1 diastolic dysfunction as well as moderate pericardial effusion. BNP mildly elevated at 316 on admission. Chest x-ray showed persistent left pleural effusion.  - Euvolemic on exam. - Continue Lasix 40mg   daily.  Hypertension BP well controlled.  - Will restart IV Diltiazem as above. - Will increase Lopressor to 25mg  twice daily. - Will stop Lisinopril to allow for more rate control if needed.  Hyperlipidemia Lipid panel this admission: Total Cholesterol 110, Triglycerides 39, HDL 50, LDL 52. LDL goal <70 given CAD. - Continue Lipitor 20mg  daily.  CKD Stage IIIb/IV Creatinine 1.97 on admission. Base creatinine around 1.9 to 2.22. - Creatinine has been trending up the last couple of days and is 2.34 today. - Will hold Lisinopril. - Continue to monitor closely.   Otherwise, per primary team: - Alzheimer's dementia - Anxiety/ depression - History of SAH  For questions or updates, please contact [place] Please consult [email or web address] for contact info under        Signed, Darreld Mclean, PA-C  04/30/2022, 6:52 AM     ADDENDUM:  Received notification from RN that he became hypotensive with systolic BP in the [id number]. Asked RN to stop IV Cardizem.  He went to the bathrom and had a bowel movement and then had a syncopal episodes when being helped back to bed. He did not fall or hit his head. He had some lightheadedness, nausea, and diaphoresis/ clamminess with this. Systolic BP prior to going to the bathroom was in the 90s. After syncopal episode, systolic BP in the 0000000. He is currently getting a bolus of IV fluids. BP improving. Systolic BP in the [id number]. He converted to sinus rhythm around 9am. Rates currently in the 50s. Looks like he slowly brady downed around syncopal episodes to rates in the low 40s.  Telemetry shows sinus bradycardia. No high grade AV block. This is is consistent with a vaso-vagal event. He is currently resting comfortably in no acute distress. He is still a little lightheadedness but feeling better. Will remain off IV Cardizem.  Will also hold Lopressor for now. Also asked RN to check orthostatic vital signs  later today.   Darreld Mclean, PA-C 04/30/2022  11:16 AM  Patient seen and examined with Sande Rives PA-C.  Agree as above, with the following exceptions and changes as noted below. Pt seen immediately after syncopal episode noted above. Like neurocardiogenic. Wife at bedside. Gen: NAD, CV: RRR, no murmurs, Lungs: clear, Abd: soft, Extrem: Warm, well perfused, no edema, Neuro/Psych: alert and oriented x 3, normal mood and affect. All available labs, radiology testing, previous records reviewed. He is in SR. Hold AVN blocking agents given hypotensive syncope. Update limited echo. Monitor at least overnight on tele.  Elouise Munroe, MD 04/30/22 11:42 AM

## 2022-04-30 NOTE — Progress Notes (Signed)
Mobility Specialist Progress Note:   04/30/22 1100  Mobility  Activity Ambulated with assistance to bathroom  Level of Assistance Contact guard assist, steadying assist  Assistive Device Four wheel walker  Distance Ambulated (ft) 15 ft  Activity Response Tolerated poorly (vasovagal response)  Mobility Referral Yes  $Mobility charge 1 Mobility   Pt received standing at EOB, requesting to go to BR. Required only minG assist to ambulate to BR. HR 77bpm. Pt with large BM. Once pt stood from toilet, had a vasovagal syncopal episode for ~50min with HR down to 37bpm. Able to lower pt into rollator with assistance of wife, preventing fall. RN in to assist. BP 68/46, recovered to 123XX123 systolic once supine+fluids. Pt left in bed with all needs met, multiple providers in to assess.  Nelta Numbers Mobility Specialist Please contact via SecureChat or  Rehab office at [phone]

## 2022-04-30 NOTE — Progress Notes (Signed)
Echocardiogram 2D Echocardiogram has been performed.  [name] 04/30/2022, 11:58 AM

## 2022-04-30 NOTE — Progress Notes (Signed)
Echocardiogram attempted, patient is in A-fib since 6 am. Patient's Nurse requested to hold off on the exam per doctor's request.

## 2022-04-30 NOTE — Progress Notes (Signed)
PT Cancellation Note  Patient Details Name: [name] MRN: [id number] DOB: Jan 26, 1934   Cancelled Treatment:    Reason Eval/Treat Not Completed: Medical issues which prohibited therapy.  MD has asked for hold of tx, will reassess with medical staff input for tomorrow.   Ramond Dial 04/30/2022, 2:15 PM  Mee Hives, PT PhD Acute Rehab Dept. Number: [place] and [place]

## 2022-04-30 NOTE — Care Management Important Message (Signed)
Important Message  Patient Details  Name: [name] MRN: [id number] Date of Birth: 10/27/1933   Medicare Important Message Given:  Yes     Shelda Altes 04/30/2022, 9:09 AM

## 2022-05-01 DIAGNOSIS — I4891 Unspecified atrial fibrillation: Secondary | ICD-10-CM | POA: Diagnosis not present

## 2022-05-01 LAB — BASIC METABOLIC PANEL
Anion gap: 13 (ref 5–15)
BUN: 50 mg/dL — ABNORMAL HIGH (ref 8–23)
CO2: 20 mmol/L — ABNORMAL LOW (ref 22–32)
Calcium: 8.6 mg/dL — ABNORMAL LOW (ref 8.9–10.3)
Chloride: 105 mmol/L (ref 98–111)
Creatinine, Ser: 2.14 mg/dL — ABNORMAL HIGH (ref 0.61–1.24)
GFR, Estimated: 29 mL/min — ABNORMAL LOW (ref >60)
Glucose, Bld: 99 mg/dL (ref 70–99)
Potassium: 4.1 mmol/L (ref 3.5–5.1)
Sodium: 138 mmol/L (ref 135–145)

## 2022-05-01 MED ORDER — AMIODARONE HCL IN DEXTROSE 360-4.14 MG/200ML-% IV SOLN: 30.0000 mg/h | INTRAVENOUS | Status: DC

## 2022-05-01 MED ORDER — AMIODARONE LOAD VIA INFUSION
150.0000 mg | Freq: Once | INTRAVENOUS | Status: DC
  Filled 2022-05-01: qty 83.34

## 2022-05-01 MED ORDER — AMIODARONE HCL IN DEXTROSE 360-4.14 MG/200ML-% IV SOLN
60.0000 mg/h | INTRAVENOUS | Status: DC
  Filled 2022-05-01: qty 200

## 2022-05-01 MED ORDER — AMIODARONE HCL 200 MG PO TABS
400.0000 mg | ORAL_TABLET | Freq: Two times a day (BID) | ORAL | Status: DC
  Administered 2022-05-01 – 2022-05-02 (×3): 400 mg via ORAL
  Filled 2022-05-01 (×3): qty 2

## 2022-05-01 NOTE — Progress Notes (Signed)
PROGRESS NOTE    HARM TEO  [id number] DOB: 1933/11/10 DOA: 04/27/2022 PCP: [name], MD     Brief Narrative:  [name] is a 89 y.o. male with medical history significant for coronary artery disease status post PCI with stent placement, HFpEF 55 to 60% with grade 1 diastolic dysfunction, who initially presented to [facility] ED with complaints of chest pain.  Centrally located, sharp, non radiating, worse with deep breath and present at rest.  Associated with shortness of breath with minimal exertion.   While in the ED, lab studies are notable for elevated high-sensitivity troponin 30, uptrended to 32, flat.  EDP discussed the case with cardiology on-call who recommended admission to hospitalist service for further workup.   Upon arrival to [facility] telemetry cardiac unit, the patient complaints of persistent sharp centrally located chest pain.  In Afib with RVR on diltiazem drip.  Po Lopressor added with some improvement of the heart rate.  Denies palpitations even when in Afib RVR.  Cardiology consulted.  On 3/25, patient went into A-fib RVR.  He was started on PO Lopressor and Cardizem drip.  Patient subsequently suffered from hypotensive/bradycardic episode with syncope (without sustained injury). He converted to normal sinus rhythm.   New events last 24 hours / Subjective: On my initial evaluation, patient was in normal sinus rhythm.  He then converted to A-fib RVR again with rate 130s.  He had no complaints, did not feel any palpitations.  He was in good spirits overall.  Assessment & Plan:   Principal Problem:   Atrial fibrillation with RVR (HCC) Active Problems:   Benign prostatic hyperplasia   Essential hypertension   Stage 3b chronic kidney disease (HCC)   CAD (coronary artery disease)   History of SAH (subarachnoid hemorrhage)   A-fib RVR -Discussed with patient and wife at bedside, patient remains poor candidate for anticoagulation due to fall  risk, history of subdural hematoma, hematuria -Aspirin -Cardiology following.  Back in A-fib RVR today.  Patient does not tolerate AV nodal blocking agents due to hypotension and bradycardia.  He was started on amiodarone drip today  Syncope -In setting of metoprolol and cardizem gtt -Orthostatic VS pending   Demand ischemia -In setting of A-fib RVR  Chronic diastolic heart failure -Echocardiogram EF 55-60% -Stable today   AKI on CKD stage IIIb -Baseline creatinine 1.97 -Holding lasix and lisinopril -Improved   Hypertension -Hold lisinopril  Hyperlipidemia -Lipitor, Zetia  Anxiety/depression -Escitalopram  Memory loss with sundowning -Namenda -Delirium precaution  Obesity -BMI 30.67  Lesion in tail of pancreas -MRI abdomen in 2022 revealed cystic lesion in pancreatic tail -PET scan in 2022 revealed benign inflammatory etiology of left upper lobe chest lesion, no substantial hypermetabolic activity associated with cystic lesion of pancreatic tail -Follow-up outpatient  DVT prophylaxis:  enoxaparin (LOVENOX) injection 30 mg Start: 04/28/22 1000  Code Status: Full Family Communication: Wife at bedside  Disposition Plan:  Status is: Inpatient Remains inpatient appropriate because: A Fib RVR, amiodarone drip  Consultants:  Cardiology    Antimicrobials:  Anti-infectives (From admission, onward)    None        Objective: Vitals:   05/01/22 0811 05/01/22 0817 05/01/22 1144 05/01/22 1221  BP:  138/65 119/63 108/62  Pulse:  68 78 72  Resp:  18 18 17   Temp:  97.8 F (36.6 C) 98.1 F (36.7 C)   TempSrc:  Oral Oral   SpO2: 97% 97%    Weight:  92 kg  Height:  5\' 8"  (1.727 m)      Intake/Output Summary (Last 24 hours) at 05/01/2022 1410 Last data filed at 05/01/2022 1222 Gross per 24 hour  Intake 483.75 ml  Output 1027 ml  Net -543.25 ml    Filed Weights   04/29/22 0400 04/30/22 0500 05/01/22 0817  Weight: 88.9 kg 88.1 kg 92 kg     Examination:  General exam: Appears calm and comfortable  Respiratory system: Clear to auscultation. Respiratory effort normal. No respiratory distress. No conversational dyspnea.  Cardiovascular system: S1 & S2 heard, tachycardic, irregular rhythm. No murmurs.  Gastrointestinal system: Abdomen is nondistended, soft and nontender. Normal bowel sounds heard. Central nervous system: Alert and oriented. No focal neurological deficits. Speech clear.  Extremities: Symmetric in appearance  Skin: No rashes, lesions or ulcers on exposed skin  Psychiatry: Judgement and insight appear normal. Mood & affect appropriate.   Data Reviewed: I have personally reviewed following labs and imaging studies  CBC: Recent Labs  Lab 04/27/22 1824 04/28/22 0128  WBC 5.5 6.2  HGB 10.5* 10.3*  HCT 32.2* 31.0*  MCV 95.3 95.4  PLT 161 [id number]    Basic Metabolic Panel: Recent Labs  Lab 04/27/22 1824 04/28/22 0128 04/29/22 0252 04/30/22 0204 05/01/22 0526  NA 136 134* 136 134* 138  K 4.7 4.5 4.0 4.5 4.1  CL 106 101 106 103 105  CO2 22 24 22 22  20*  GLUCOSE 86 189* 98 95 99  BUN 43* 37* 45* 55* 50*  CREATININE 1.97* 1.95* 2.07* 2.34* 2.14*  CALCIUM 8.9 8.5* 8.5* 8.2* 8.6*  MG  --  1.9  --   --   --   PHOS  --  3.2  --   --   --     GFR: Estimated Creatinine Clearance: 27.3 mL/min (A) (by C-G formula based on SCr of 2.14 mg/dL (H)). Liver Function Tests: Recent Labs  Lab 04/27/22 1824 04/28/22 0128  AST 24 25  ALT 14 14  ALKPHOS 70 65  BILITOT 0.6 0.7  PROT 6.6 5.5*  ALBUMIN 3.7 3.0*    Recent Labs  Lab 04/28/22 0122  LIPASE 38    No results for input(s): "AMMONIA" in the last 168 hours. Coagulation Profile: No results for input(s): "INR", "PROTIME" in the last 168 hours. Cardiac Enzymes: No results for input(s): "CKTOTAL", "CKMB", "CKMBINDEX", "TROPONINI" in the last 168 hours. BNP (last 3 results) No results for input(s): "PROBNP" in the last 8760 hours. HbA1C: No results  for input(s): "HGBA1C" in the last 72 hours.  CBG: No results for input(s): "GLUCAP" in the last 168 hours. Lipid Profile: No results for input(s): "CHOL", "HDL", "LDLCALC", "TRIG", "CHOLHDL", "LDLDIRECT" in the last 72 hours.  Thyroid Function Tests: Recent Labs    04/30/22 0202  TSH 1.843    Anemia Panel: No results for input(s): "VITAMINB12", "FOLATE", "FERRITIN", "TIBC", "IRON", "RETICCTPCT" in the last 72 hours. Sepsis Labs: No results for input(s): "PROCALCITON", "LATICACIDVEN" in the last 168 hours.  No results found for this or any previous visit (from the past 240 hour(s)).    Radiology Studies: ECHOCARDIOGRAM LIMITED  Result Date: 04/30/2022    ECHOCARDIOGRAM LIMITED REPORT   Patient Name:   TIMMY DWINELL Date of Exam: 04/30/2022 Medical Rec #:  [id number]          Height:       68.0 in Accession #:    [id number]         Weight:  194.2 lb Date of Birth:  1934/01/25          BSA:          2.018 m Patient Age:    18 years           BP:           87/52 mmHg Patient Gender: M                  HR:           56 bpm. Exam Location:  Inpatient Procedure: Limited Echo and Limited Color Doppler Indications:     Elevated Troponin  History:         Patient has prior history of Echocardiogram examinations, most                  recent 01/10/2022. CAD, Arrythmias:Atrial Fibrillation; Risk                  Factors:Hypertension, Diabetes and Dyslipidemia. CKD, stage 3.  Sonographer:     Ronny Flurry Referring Phys:  [id number] [place] Diagnosing Phys: Glori Bickers MD IMPRESSIONS  1. The LV endocardium is not well visualized in any images. EF appears grossly normal.. Left ventricular ejection fraction, by estimation, is 55 to 60%. The left ventricle has normal function. Left ventricular endocardial border not optimally defined to  evaluate regional wall motion. There is moderate concentric left ventricular hypertrophy.  2. Right ventricular systolic function is normal.  3. A small  pericardial effusion is present. The pericardial effusion is posterior to the left ventricle. There is no evidence of cardiac tamponade.  4. Trivial mitral valve regurgitation.  5. The aortic valve is tricuspid. There is mild calcification of the aortic valve. Aortic valve regurgitation is trivial.  6. Technically limited study due to ppor sound wave transmission. FINDINGS  Left Ventricle: The LV endocardium is not well visualized in any images. EF appears grossly normal. Left ventricular ejection fraction, by estimation, is 55 to 60%. The left ventricle has normal function. Left ventricular endocardial border not optimally defined to evaluate regional wall motion. There is moderate concentric left ventricular hypertrophy. Right Ventricle: Right ventricular systolic function is normal. Pericardium: A small pericardial effusion is present. The pericardial effusion is posterior to the left ventricle. There is no evidence of cardiac tamponade. Mitral Valve: Mild mitral annular calcification. Trivial mitral valve regurgitation. Tricuspid Valve: The tricuspid valve is not well visualized. Tricuspid valve regurgitation is trivial. Aortic Valve: The aortic valve is tricuspid. There is mild calcification of the aortic valve. Aortic valve regurgitation is trivial. LEFT VENTRICLE PLAX 2D LVIDd:         4.95 cm LVIDs:         3.70 cm LV PW:         1.20 cm LV IVS:        1.50 cm LVOT diam:     2.30 cm LVOT Area:     4.15 cm  LEFT ATRIUM         Index LA diam:    3.50 cm 1.73 cm/m   AORTA Ao Root diam: 3.70 cm Ao Asc diam:  3.50 cm  SHUNTS Systemic Diam: 2.30 cm Glori Bickers MD Electronically signed by Glori Bickers MD Signature Date/Time: 04/30/2022/12:41:27 PM    Final (Updated)       Scheduled Meds:  amiodarone  150 mg Intravenous Once   amiodarone  400 mg Oral BID   aspirin EC  81 mg Oral  Daily   atorvastatin  20 mg Oral Daily   doxazosin  2 mg Oral Daily   enoxaparin (LOVENOX) injection  30 mg  Subcutaneous Q24H   escitalopram  10 mg Oral Daily   ezetimibe  10 mg Oral Daily   fluticasone furoate-vilanterol  1 puff Inhalation Daily   memantine  5 mg Oral QHS   Continuous Infusions:   LOS: 4 days   Time spent: 35 minutes   Dessa Phi, DO [facility] 05/01/2022, 2:10 PM   Available via Epic secure chat 7am-7pm After these hours, please refer to coverage provider listed on [email or web address]

## 2022-05-01 NOTE — Progress Notes (Signed)
Patient woke up one time over the night with head fullness and some dizziness and lower back pain that is consistent with his chronic back pain. Vitals are WDL. Will continue to monitor for any acute changes.

## 2022-05-01 NOTE — Progress Notes (Signed)
PT Cancellation Note  Patient Details Name: [name] MRN: [id number] DOB: Dec 15, 1933   Cancelled Treatment:    Reason Eval/Treat Not Completed: Medical issues which prohibited therapy (Per RN, pt currently in afib and asks to hold at this time. Will try again tomorrow.)   Viann Shove 05/01/2022, 10:51 AM

## 2022-05-01 NOTE — Progress Notes (Addendum)
Rounding Note    Patient Name: [name] Date of Encounter: 05/01/2022  [place] Cardiologist: Sherren Mocha, MD   Subjective   Doing well this morning. Maintaining sinus rhythm with rates in the 60s. Wife reports he had some lightheadedness overnight but she thinks this may be due to the Memantine. He reports some pain in his mid back when he takes a deep breath but this is reproducible with palpation so sounds musculoskeletal. No other chest pain. No shortness of breath. No palpitations or lightheadedness this morning but he has not been out of bed since vasovagal syncopal episode yesterday morning.   Inpatient Medications    Scheduled Meds:  aspirin EC  81 mg Oral Daily   atorvastatin  20 mg Oral Daily   doxazosin  2 mg Oral Daily   enoxaparin (LOVENOX) injection  30 mg Subcutaneous Q24H   escitalopram  10 mg Oral Daily   ezetimibe  10 mg Oral Daily   fluticasone furoate-vilanterol  1 puff Inhalation Daily   memantine  5 mg Oral QHS   Continuous Infusions:  PRN Meds: acetaminophen, melatonin, polyethylene glycol, prochlorperazine   Vital Signs    Vitals:   04/30/22 2035 04/30/22 2100 05/01/22 0145 05/01/22 0500  BP: 115/66 (!) 131/57 128/63 (!) 147/68  Pulse: 62   64  Resp: 17   18  Temp: 98.1 F (36.7 C)   97.8 F (36.6 C)  TempSrc: Oral   Oral  SpO2: 95%   94%  Weight:      Height:        Intake/Output Summary (Last 24 hours) at 05/01/2022 0705 Last data filed at 04/30/2022 2335 Gross per 24 hour  Intake 483.75 ml  Output 977 ml  Net -493.25 ml      04/30/2022    5:00 AM 04/29/2022    4:00 AM 04/28/2022    3:23 AM  Last 3 Weights  Weight (lbs) 194 lb 3.6 oz 195 lb 14.4 oz 201 lb 11.5 oz  Weight (kg) 88.1 kg 88.86 kg 91.5 kg      Telemetry    Normal sinus rhythm. Rates in the 107s. - Personally Reviewed  ECG    No new ECG tracing today.  - Personally Reviewed  Physical Exam   GEN: No acute distress.   Neck: No  JVD. Cardiac: RRR. No murmurs, rubs, or gallops. Radial pulses 2+ and equal bilaterally. Respiratory: Clear to auscultation bilaterally. No wheezes, rhonchi, or rales. GI: Soft, non-distended, and non-tender.  MS: No lower extremity edema. No deformity. Skin: Warm and dry.  Neuro:  No focal deficits. Psych: Normal affect. Responds appropriately.   Labs    High Sensitivity Troponin:   Recent Labs  Lab 04/27/22 1824 04/27/22 2010 04/28/22 0122 04/28/22 0325  TROPONINIHS 30* 32* 640* 1,114*     Chemistry Recent Labs  Lab 04/27/22 1824 04/28/22 0128 04/29/22 0252 04/30/22 0204 05/01/22 0526  NA 136 134* 136 134* 138  K 4.7 4.5 4.0 4.5 4.1  CL 106 101 106 103 105  CO2 22 24 22 22  20*  GLUCOSE 86 189* 98 95 99  BUN 43* 37* 45* 55* 50*  CREATININE 1.97* 1.95* 2.07* 2.34* 2.14*  CALCIUM 8.9 8.5* 8.5* 8.2* 8.6*  MG  --  1.9  --   --   --   PROT 6.6 5.5*  --   --   --   ALBUMIN 3.7 3.0*  --   --   --   AST  24 25  --   --   --   ALT 14 14  --   --   --   ALKPHOS 70 65  --   --   --   BILITOT 0.6 0.7  --   --   --   GFRNONAA 32* 33* 31* 26* 29*  ANIONGAP 8 9 8 9 13     Lipids  Recent Labs  Lab 04/28/22 0122  CHOL 110  TRIG 39  HDL 50  LDLCALC 52  CHOLHDL 2.2    Hematology Recent Labs  Lab 04/27/22 1824 04/28/22 0128  WBC 5.5 6.2  RBC 3.38* 3.25*  HGB 10.5* 10.3*  HCT 32.2* 31.0*  MCV 95.3 95.4  MCH 31.1 31.7  MCHC 32.6 33.2  RDW 13.2 13.1  PLT 161 164   Thyroid  Recent Labs  Lab 04/30/22 0202  TSH 1.843    BNP Recent Labs  Lab 04/27/22 1824  BNP 316.9*    DDimer No results for input(s): "DDIMER" in the last 168 hours.   Radiology    ECHOCARDIOGRAM LIMITED  Result Date: 04/30/2022    ECHOCARDIOGRAM LIMITED REPORT   Patient Name:   JAS REINHOLTZ Date of Exam: 04/30/2022 Medical Rec #:  [id number]          Height:       68.0 in Accession #:    [id number]         Weight:       194.2 lb Date of Birth:  November 09, 1933          BSA:          2.018 m  Patient Age:    21 years           BP:           87/52 mmHg Patient Gender: M                  HR:           56 bpm. Exam Location:  Inpatient Procedure: Limited Echo and Limited Color Doppler Indications:     Elevated Troponin  History:         Patient has prior history of Echocardiogram examinations, most                  recent 01/10/2022. CAD, Arrythmias:Atrial Fibrillation; Risk                  Factors:Hypertension, Diabetes and Dyslipidemia. CKD, stage 3.  Sonographer:     Ronny Flurry Referring Phys:  [id number] [place] Diagnosing Phys: Glori Bickers MD IMPRESSIONS  1. The LV endocardium is not well visualized in any images. EF appears grossly normal.. Left ventricular ejection fraction, by estimation, is 55 to 60%. The left ventricle has normal function. Left ventricular endocardial border not optimally defined to  evaluate regional wall motion. There is moderate concentric left ventricular hypertrophy.  2. Right ventricular systolic function is normal.  3. A small pericardial effusion is present. The pericardial effusion is posterior to the left ventricle. There is no evidence of cardiac tamponade.  4. Trivial mitral valve regurgitation.  5. The aortic valve is tricuspid. There is mild calcification of the aortic valve. Aortic valve regurgitation is trivial.  6. Technically limited study due to ppor sound wave transmission. FINDINGS  Left Ventricle: The LV endocardium is not well visualized in any images. EF appears grossly normal. Left ventricular ejection fraction, by estimation, is 55 to 60%. The left  ventricle has normal function. Left ventricular endocardial border not optimally defined to evaluate regional wall motion. There is moderate concentric left ventricular hypertrophy. Right Ventricle: Right ventricular systolic function is normal. Pericardium: A small pericardial effusion is present. The pericardial effusion is posterior to the left ventricle. There is no evidence of cardiac  tamponade. Mitral Valve: Mild mitral annular calcification. Trivial mitral valve regurgitation. Tricuspid Valve: The tricuspid valve is not well visualized. Tricuspid valve regurgitation is trivial. Aortic Valve: The aortic valve is tricuspid. There is mild calcification of the aortic valve. Aortic valve regurgitation is trivial. LEFT VENTRICLE PLAX 2D LVIDd:         4.95 cm LVIDs:         3.70 cm LV PW:         1.20 cm LV IVS:        1.50 cm LVOT diam:     2.30 cm LVOT Area:     4.15 cm  LEFT ATRIUM         Index LA diam:    3.50 cm 1.73 cm/m   AORTA Ao Root diam: 3.70 cm Ao Asc diam:  3.50 cm  SHUNTS Systemic Diam: 2.30 cm Glori Bickers MD Electronically signed by Glori Bickers MD Signature Date/Time: 04/30/2022/12:41:27 PM    Final (Updated)     Cardiac Studies   Complete Echocardiogram 01/10/2022: Impressions:  1. Left ventricular ejection fraction, by estimation, is 55 to 60%. The  left ventricle has normal function. The left ventricle has no regional  wall motion abnormalities. There is moderate concentric left ventricular  hypertrophy. Left ventricular  diastolic parameters are consistent with Grade I diastolic dysfunction  (impaired relaxation).   2. Right ventricular systolic function is normal. The right ventricular  size is normal.   3. Moderate pericardial effusion. The pericardial effusion is posterior  to the left ventricle.   4. The mitral valve is degenerative. No evidence of mitral valve  regurgitation. No evidence of mitral stenosis.   5. The aortic valve is tricuspid. There is mild calcification of the  aortic valve. There is mild thickening of the aortic valve. Aortic valve  regurgitation is mild. Aortic valve sclerosis is present, with no evidence  of aortic valve stenosis.   Comparison(s): Changes from prior study are noted. LVEF unchanged.  Moderate pericardial effusion posterior to LV, but no signs of tamponade.  _______________  Limited Echocardiogram  04/30/2022: Impressions:  1. The LV endocardium is not well visualized in any images. EF appears  grossly normal.. Left ventricular ejection fraction, by estimation, is 55  to 60%. The left ventricle has normal function. Left ventricular  endocardial border not optimally defined to   evaluate regional wall motion. There is moderate concentric left  ventricular hypertrophy.   2. Right ventricular systolic function is normal.   3. A small pericardial effusion is present. The pericardial effusion is  posterior to the left ventricle. There is no evidence of cardiac  tamponade.   4. Trivial mitral valve regurgitation.   5. The aortic valve is tricuspid. There is mild calcification of the  aortic valve. Aortic valve regurgitation is trivial.   6. Technically limited study due to ppor sound wave transmission.    Patient Profile     89 y.o. male with a history of CAD s/p DES to proximal LAD in 2008, chronic HFpEF, mild bilateral carotid artery stenosis on dopplers in 11/2018, hypertension, hyperlipidemia, type 2 diabetes mellitus,  CKD stage III, traumatic SAH in 2021, and Alzheimer's dementia  who presented on 04/27/2022 with chest pain and was found to be in new onset atrial fibrillation with RVR.   Assessment & Plan    New Onset Atrial Fibrillation Patient presented with chest pain. Initial EKG in the ED showed normal sinus rhythm but then he went into rapid atrial fibrillation with rates in the 150s. Electrolytes and TSH within normal limits. He was started on IV Diltiazem and has gone in and out of atrial fibrillation since then.  - Maintaining sinus rhythm this morning. Rates in the 52s. No recurrent atrial fibrillation since morning of 3/25.  - Echo showed normal LV function. - He developed hypotension with IV Diltiazem and PO Lopressor on 3/25 so both were stopped.  - Will discuss with MD about restarting low dose Toprol-XL 12.5mg  daily vs PRN Lopressor for tachycardia/ atrial fibrillation  at discharge. - CHA2DS2-VASc = 6 (CAD, CHF, HTN, DM, age x2) . However, he is not felt to be an anticoagulation candidate given history of SAH, hematuria, chronic anemia, recurrent falls, and severe dementia.  - May benefit from outpatient monitor as an outpatient to help assess atrial fibrillation burden and rate control. Wife is very interested in this. Will discuss with MD.  Chest Pain Likely Demand Ischemia CAD Patient has a history of CAD with prior DES to LAD in 2008. He presented with chest pain. Initial EKG showed normal sinus rhythm with no acute ST/T changes. He was then noted to go into rapid atrial fibrillation. High-sensitivity elevated at 32 >> 640 >> 1,114. Chest CTA was negative for aortic dissection/ aneurysm. Echo showed LVEF of 55-60% with moderate LVH. LV endocardial border not optimally defined to evaluate regional wall motion.  - He reports some mid back pain when taking a deep breath that is reproducible with palpation and sounds musculoskeletal in nature. However, no recurrent chest pain.  - Continue aspirin, beta-blocker, and statin. - Symptoms and troponin elevation  felt to most likely be due to demand ischemia in setting of rapid atrial fibrillation. He is not a good cardiac cath candidate at this time given advanced age, severe dementia, and CKD.   Chronic HFpEF Last Echo in 01/2022 showed LVEF of 55-60% with normal wall motion, moderate LVH, and grade 1 diastolic dysfunction as well as moderate pericardial effusion. BNP mildly elevated at 316 on admission. Chest x-ray showed persistent left pleural effusion. Echo this admission showed LVEF of 55-60% with moderate LVH, normal RV, and small pericardial effusion with no evidence of cardiac tamponade. - Euvolemic on exam. - Can resume PRN Lasix on discharge.  Syncope Patient had a syncopal episode on morning of 3/25 after standing up after having a bowel movement. This occurred after he was restarted on IV Diltiazem and BP  had dropped. Systolic BP was in the 0000000 following syncopal event and telemetry showed that BP slowly dropped (sinus bradycardia) with rates in the low 40s around this time. This was consistent with vasovagal event. - No recurrence. - No significant arrhythmias noted on telemetry.   Hypertension History of hypertension but patient became hypotensive on 3/25 after being restarted on IV Diltiazem. Systolic BP dropped as low as the 60s during a vasovagal events.  - BP elevated this morning with systolic BP as high as the 160s. - Would continue to hold home Lisinopril for now given GFR <30.  - Will check orthostatic vitals signs this morning and if negative will add back antihypertensives (maybe Amlodipine given antianginal benefit).    Hyperlipidemia Lipid panel this admission:  Total Cholesterol 110, Triglycerides 39, HDL 50, LDL 52. LDL goal <70 given CAD. - Continue Lipitor 20mg  daily.   CKD Stage IIIb/IV Creatinine 1.97 on admission. Base creatinine around 1.9 to 2.22. - Creatinine stable at 2.14 today. GFR 29. - Will continue to hold home Lisinopril for now. - Continue to monitor closely.    Otherwise, per primary team: - Alzheimer's dementia - Anxiety/ depression - History of SAH  For questions or updates, please contact [place] Please consult [email or web address] for contact info under        Signed, Darreld Mclean, PA-C  05/01/2022, 7:05 AM    Patient seen and examined with Sande Rives PA-C.  Agree as above, with the following exceptions and changes as noted below. Appears to be in atrial flutter today, rate 134. Gen: NAD, CV: tachycardic, no murmurs, Lungs: clear, Abd: soft, Extrem: Warm, well perfused, no edema, Neuro/Psych: alert and oriented x 3, normal mood and affect. All available labs, radiology testing, previous records reviewed. Discussed medical management in detail. ASA restarted while in hospital, may be reasonable with elevated trop but will want to be  cautious with bleeding history. Can be revisited with primary cardiologist or PCP. Reviewed rationale for starting amiodarone despite no anticoagulation, he is self converting off of anticoagulation. May be most beneficial to have him maintain SR, hopefully with amiodarone on board this will be more likely.   Elouise Munroe, MD 05/01/22 12:01 PM

## 2022-05-02 ENCOUNTER — Other Ambulatory Visit (HOSPITAL_COMMUNITY): Payer: Self-pay

## 2022-05-02 ENCOUNTER — Other Ambulatory Visit: Payer: Self-pay | Admitting: Student

## 2022-05-02 DIAGNOSIS — N1832 Chronic kidney disease, stage 3b: Secondary | ICD-10-CM

## 2022-05-02 DIAGNOSIS — I4891 Unspecified atrial fibrillation: Secondary | ICD-10-CM | POA: Diagnosis not present

## 2022-05-02 DIAGNOSIS — I609 Nontraumatic subarachnoid hemorrhage, unspecified: Secondary | ICD-10-CM

## 2022-05-02 DIAGNOSIS — I10 Essential (primary) hypertension: Secondary | ICD-10-CM

## 2022-05-02 DIAGNOSIS — I2510 Atherosclerotic heart disease of native coronary artery without angina pectoris: Secondary | ICD-10-CM

## 2022-05-02 DIAGNOSIS — N400 Enlarged prostate without lower urinary tract symptoms: Secondary | ICD-10-CM

## 2022-05-02 LAB — BASIC METABOLIC PANEL
Anion gap: 8 (ref 5–15)
BUN: 51 mg/dL — ABNORMAL HIGH (ref 8–23)
CO2: 24 mmol/L (ref 22–32)
Calcium: 8.4 mg/dL — ABNORMAL LOW (ref 8.9–10.3)
Chloride: 104 mmol/L (ref 98–111)
Creatinine, Ser: 2.16 mg/dL — ABNORMAL HIGH (ref 0.61–1.24)
GFR, Estimated: 29 mL/min — ABNORMAL LOW (ref >60)
Glucose, Bld: 100 mg/dL — ABNORMAL HIGH (ref 70–99)
Potassium: 4.5 mmol/L (ref 3.5–5.1)
Sodium: 136 mmol/L (ref 135–145)

## 2022-05-02 MED ORDER — AMIODARONE HCL 200 MG PO TABS
ORAL_TABLET | ORAL | 0 refills | Status: DC
  Filled 2022-05-02: qty 108, 90d supply, fill #0

## 2022-05-02 MED ORDER — ASPIRIN 81 MG PO TBEC: 81.0000 mg | DELAYED_RELEASE_TABLET | Freq: Every day | ORAL | 12 refills | Status: DC

## 2022-05-02 NOTE — Progress Notes (Signed)
Ordered 1 week Zio monitor to help assess atrial fibrillation burden and rate control. Please see rounding note from today for more information.  Darreld Mclean, PA-C 05/02/2022 5:28 PM

## 2022-05-02 NOTE — Progress Notes (Addendum)
Rounding Note    Patient Name: [name] Date of Encounter: 05/02/2022  [place] Cardiologist: Sherren Mocha, MD   Subjective   No acute overnight events. Maintaining sinus rhythm this this morning. No chest pain or palpitations. He continues to have some mid back pain when he takes a deep breath which was reproducible with palpation on exam yesterday. Wife also reports that he has had profuse night sweats the last couple of night. No fevers or unexplained weight loss. Recommended discussing this with primary team.  Inpatient Medications    Scheduled Meds:  amiodarone  150 mg Intravenous Once   amiodarone  400 mg Oral BID   aspirin EC  81 mg Oral Daily   atorvastatin  20 mg Oral Daily   doxazosin  2 mg Oral Daily   enoxaparin (LOVENOX) injection  30 mg Subcutaneous Q24H   escitalopram  10 mg Oral Daily   ezetimibe  10 mg Oral Daily   fluticasone furoate-vilanterol  1 puff Inhalation Daily   memantine  5 mg Oral QHS   Continuous Infusions:  PRN Meds: acetaminophen, melatonin, polyethylene glycol, prochlorperazine   Vital Signs    Vitals:   05/01/22 1947 05/02/22 0317 05/02/22 0550 05/02/22 0804  BP: 118/61 138/60    Pulse: 75 70 72   Resp: 18 18    Temp: 98.6 F (37 C) 98.1 F (36.7 C) 98.1 F (36.7 C)   TempSrc: Oral Oral Oral   SpO2: 96% 97%  98%  Weight:  92.1 kg    Height:        Intake/Output Summary (Last 24 hours) at 05/02/2022 0840 Last data filed at 05/02/2022 0500 Gross per 24 hour  Intake 240 ml  Output 1150 ml  Net -910 ml      05/02/2022    3:17 AM 05/01/2022    8:17 AM 04/30/2022    5:00 AM  Last 3 Weights  Weight (lbs) 203 lb 0.7 oz 202 lb 14.4 oz 194 lb 3.6 oz  Weight (kg) 92.1 kg 92.035 kg 88.1 kg      Telemetry    Normal sinus rhythm with rates in the 70s to 80s. - Personally Reviewed  ECG    No new ECG tracings today. - Personally Reviewed  Physical Exam   GEN: No acute distress.   Neck: No JVD.   Cardiac: RRR. No murmurs, rubs, or gallops.  Respiratory: Clear to auscultation bilaterally. No wheezes, rhonchi, or rales. GI: Soft, non-distended, and non-tender.  MS: No lower extremity edema. No deformity. Skin: Warm and dry. Neuro:  No focal deficits.  Psych: Normal affect.   Labs    High Sensitivity Troponin:   Recent Labs  Lab 04/27/22 1824 04/27/22 2010 04/28/22 0122 04/28/22 0325  TROPONINIHS 30* 32* 640* 1,114*     Chemistry Recent Labs  Lab 04/27/22 1824 04/28/22 0128 04/29/22 0252 04/30/22 0204 05/01/22 0526 05/02/22 0228  NA 136 134*   < > 134* 138 136  K 4.7 4.5   < > 4.5 4.1 4.5  CL 106 101   < > 103 105 104  CO2 22 24   < > 22 20* 24  GLUCOSE 86 189*   < > 95 99 100*  BUN 43* 37*   < > 55* 50* 51*  CREATININE 1.97* 1.95*   < > 2.34* 2.14* 2.16*  CALCIUM 8.9 8.5*   < > 8.2* 8.6* 8.4*  MG  --  1.9  --   --   --   --  PROT 6.6 5.5*  --   --   --   --   ALBUMIN 3.7 3.0*  --   --   --   --   AST 24 25  --   --   --   --   ALT 14 14  --   --   --   --   ALKPHOS 70 65  --   --   --   --   BILITOT 0.6 0.7  --   --   --   --   GFRNONAA 32* 33*   < > 26* 29* 29*  ANIONGAP 8 9   < > 9 13 8    < > = values in this interval not displayed.    Lipids  Recent Labs  Lab 04/28/22 0122  CHOL 110  TRIG 39  HDL 50  LDLCALC 52  CHOLHDL 2.2    Hematology Recent Labs  Lab 04/27/22 1824 04/28/22 0128  WBC 5.5 6.2  RBC 3.38* 3.25*  HGB 10.5* 10.3*  HCT 32.2* 31.0*  MCV 95.3 95.4  MCH 31.1 31.7  MCHC 32.6 33.2  RDW 13.2 13.1  PLT 161 164   Thyroid  Recent Labs  Lab 04/30/22 0202  TSH 1.843    BNP Recent Labs  Lab 04/27/22 1824  BNP 316.9*    DDimer No results for input(s): "DDIMER" in the last 168 hours.   Radiology    ECHOCARDIOGRAM LIMITED  Result Date: 04/30/2022    ECHOCARDIOGRAM LIMITED REPORT   Patient Name:   [name] Date of Exam: 04/30/2022 Medical Rec #:  [id number]          Height:       68.0 in Accession #:     [id number]         Weight:       194.2 lb Date of Birth:  1933-04-19          BSA:          2.018 m Patient Age:    29 years           BP:           87/52 mmHg Patient Gender: M                  HR:           56 bpm. Exam Location:  Inpatient Procedure: Limited Echo and Limited Color Doppler Indications:     Elevated Troponin  History:         Patient has prior history of Echocardiogram examinations, most                  recent 01/10/2022. CAD, Arrythmias:Atrial Fibrillation; Risk                  Factors:Hypertension, Diabetes and Dyslipidemia. CKD, stage 3.  Sonographer:     Ronny Flurry Referring Phys:  [id number] [place] Diagnosing Phys: Glori Bickers MD IMPRESSIONS  1. The LV endocardium is not well visualized in any images. EF appears grossly normal.. Left ventricular ejection fraction, by estimation, is 55 to 60%. The left ventricle has normal function. Left ventricular endocardial border not optimally defined to  evaluate regional wall motion. There is moderate concentric left ventricular hypertrophy.  2. Right ventricular systolic function is normal.  3. A small pericardial effusion is present. The pericardial effusion is posterior to the left ventricle. There is no evidence of cardiac tamponade.  4. Trivial  mitral valve regurgitation.  5. The aortic valve is tricuspid. There is mild calcification of the aortic valve. Aortic valve regurgitation is trivial.  6. Technically limited study due to ppor sound wave transmission. FINDINGS  Left Ventricle: The LV endocardium is not well visualized in any images. EF appears grossly normal. Left ventricular ejection fraction, by estimation, is 55 to 60%. The left ventricle has normal function. Left ventricular endocardial border not optimally defined to evaluate regional wall motion. There is moderate concentric left ventricular hypertrophy. Right Ventricle: Right ventricular systolic function is normal. Pericardium: A small pericardial effusion is present.  The pericardial effusion is posterior to the left ventricle. There is no evidence of cardiac tamponade. Mitral Valve: Mild mitral annular calcification. Trivial mitral valve regurgitation. Tricuspid Valve: The tricuspid valve is not well visualized. Tricuspid valve regurgitation is trivial. Aortic Valve: The aortic valve is tricuspid. There is mild calcification of the aortic valve. Aortic valve regurgitation is trivial. LEFT VENTRICLE PLAX 2D LVIDd:         4.95 cm LVIDs:         3.70 cm LV PW:         1.20 cm LV IVS:        1.50 cm LVOT diam:     2.30 cm LVOT Area:     4.15 cm  LEFT ATRIUM         Index LA diam:    3.50 cm 1.73 cm/m   AORTA Ao Root diam: 3.70 cm Ao Asc diam:  3.50 cm  SHUNTS Systemic Diam: 2.30 cm Glori Bickers MD Electronically signed by Glori Bickers MD Signature Date/Time: 04/30/2022/12:41:27 PM    Final (Updated)     Cardiac Studies   Complete Echocardiogram 01/10/2022: Impressions:  1. Left ventricular ejection fraction, by estimation, is 55 to 60%. The  left ventricle has normal function. The left ventricle has no regional  wall motion abnormalities. There is moderate concentric left ventricular  hypertrophy. Left ventricular  diastolic parameters are consistent with Grade I diastolic dysfunction  (impaired relaxation).   2. Right ventricular systolic function is normal. The right ventricular  size is normal.   3. Moderate pericardial effusion. The pericardial effusion is posterior  to the left ventricle.   4. The mitral valve is degenerative. No evidence of mitral valve  regurgitation. No evidence of mitral stenosis.   5. The aortic valve is tricuspid. There is mild calcification of the  aortic valve. There is mild thickening of the aortic valve. Aortic valve  regurgitation is mild. Aortic valve sclerosis is present, with no evidence  of aortic valve stenosis.   Comparison(s): Changes from prior study are noted. LVEF unchanged.  Moderate pericardial effusion  posterior to LV, but no signs of tamponade.  _______________   Limited Echocardiogram 04/30/2022: Impressions:  1. The LV endocardium is not well visualized in any images. EF appears  grossly normal.. Left ventricular ejection fraction, by estimation, is 55  to 60%. The left ventricle has normal function. Left ventricular  endocardial border not optimally defined to   evaluate regional wall motion. There is moderate concentric left  ventricular hypertrophy.   2. Right ventricular systolic function is normal.   3. A small pericardial effusion is present. The pericardial effusion is  posterior to the left ventricle. There is no evidence of cardiac  tamponade.   4. Trivial mitral valve regurgitation.   5. The aortic valve is tricuspid. There is mild calcification of the  aortic valve. Aortic valve regurgitation is  trivial.   6. Technically limited study due to ppor sound wave transmission.   Patient Profile     89 y.o. male with a history of CAD s/p DES to proximal LAD in 2008, chronic HFpEF, mild bilateral carotid artery stenosis on dopplers in 11/2018, hypertension, hyperlipidemia, type 2 diabetes mellitus,  CKD stage III, traumatic SAH in 2021, and Alzheimer's dementia who presented on 04/27/2022 with chest pain and was found to be in new onset atrial fibrillation with RVR.   Assessment & Plan    New Onset Atrial Fibrillation Patient presented with chest pain. Initial EKG in the ED showed normal sinus rhythm but then he went into rapid atrial fibrillation with rates in the 150s. Electrolytes and TSH within normal limits. He has been going in and out of rapid atrial fibrillation this admission. He did not tolearte IV Diltiazem due to hypotension and was started on Amiodarone on 3/26. - Maintaining sinus rhythm this morning.  - Echo showed normal LV function. - He developed hypotension with IV Diltiazem and PO Lopressor on 3/25 so both were stopped.  - Continue Amiodarone 400mg  twice  daily. Will likely continue this for 6 additional days (including today) to achieve 5g load and then decrease to 200mg  daily. However, will confirm with MD. - CHA2DS2-VASc = 6 (CAD, CHF, HTN, DM, age x2) . However, he is not felt to be an anticoagulation candidate given history of SAH, hematuria, chronic anemia, recurrent falls, and severe dementia.  - May benefit from outpatient monitor as an outpatient to help assess atrial fibrillation burden and rate control. Wife is very interested in this. Will plan for 1 week Zio monitor.   Chest Pain Likely Demand Ischemia CAD Patient has a history of CAD with prior DES to LAD in 2008. He presented with chest pain. Initial EKG showed normal sinus rhythm with no acute ST/T changes. He was then noted to go into rapid atrial fibrillation. High-sensitivity elevated at 32 >> 640 >> 1,114. Chest CTA was negative for aortic dissection/ aneurysm. Echo showed LVEF of 55-60% with moderate LVH. LV endocardial border not optimally defined to evaluate regional wall motion.  - He reports some mid back pain when taking a deep breath that is reproducible with palpation and sounds musculoskeletal in nature. However, no recurrent chest pain.  - Continue aspirin, beta-blocker, and statin. - Symptoms and troponin elevation  felt to most likely be due to demand ischemia in setting of rapid atrial fibrillation. He is not a good cardiac cath candidate at this time given advanced age, severe dementia, and CKD.   Chronic HFpEF Last Echo in 01/2022 showed LVEF of 55-60% with normal wall motion, moderate LVH, and grade 1 diastolic dysfunction as well as moderate pericardial effusion. BNP mildly elevated at 316 on admission. Chest x-ray showed persistent left pleural effusion. Echo this admission showed LVEF of 55-60% with moderate LVH, normal RV, and small pericardial effusion with no evidence of cardiac tamponade. - Euvolemic on exam. - Can resume PRN Lasix on discharge.    Syncope Patient had a syncopal episode on morning of 3/25 after standing up after having a bowel movement. This occurred after he was restarted on IV Diltiazem and BP had dropped. Systolic BP was in the 0000000 following syncopal event and telemetry showed that BP slowly dropped (sinus bradycardia) with rates in the low 40s around this time. This was consistent with vasovagal event. Orthostatic vitals signs were negative on 3/26. - No recurrence. - No significant arrhythmias noted on telemetry.  Hypertension History of hypertension but patient became hypotensive on 3/25 after being restarted on IV Diltiazem. Systolic BP dropped as low as the 60s during a vasovagal events.  - BP well controlled this morning. - Would continue to hold home Lisinopril for now given GFR <30.    Hyperlipidemia Lipid panel this admission: Total Cholesterol 110, Triglycerides 39, HDL 50, LDL 52. LDL goal <70 given CAD. - Continue Lipitor 20mg  daily.   CKD Stage IIIb/IV Creatinine 1.97 on admission. Base creatinine around 1.9 to 2.22. - Creatinine stable at 2.16 today. GFR 29. - Will continue to hold home Lisinopril for now. - Continue to monitor closely.    Otherwise, per primary team: - Alzheimer's dementia - Anxiety/ depression - History of SAH  For questions or updates, please contact [place] Please consult [email or web address] for contact info under        Signed, Darreld Mclean, PA-C  05/02/2022, 8:40 AM     Patient seen and examined with CG PA-C.  Agree as above, with the following exceptions and changes as noted below.  Patient is sleeping comfortably remains in sinus rhythm, discussed care with wife. Gen: NAD, CV: RRR, no murmurs, Lungs: clear, Abd: soft, Extrem: Warm, well perfused, no edema All available labs, radiology testing, previous records reviewed.  Agree with amiodarone loading as above, patient and wife are aware of the lack of anticoagulation which does continue to pose a stroke  risk and are agreeable to attempting maintenance of sinus rhythm with antiarrhythmic therapy.  We will send the patient home with a 1 week Zio monitor per patient's wife's request to monitor for burden of atrial fibrillation and rates.  Follow-up with Dr. [name].  Cardiology will sign off.  Elouise Munroe, MD 05/02/22 11:20 AM

## 2022-05-02 NOTE — TOC Transition Note (Addendum)
Transition of Care [facility]) - CM/SW Discharge Note   Patient Details  Name: [name] MRN: [id number] Date of Birth: April 21, 1933  Transition of Care [facility]) CM/SW Contact:  Zenon Mayo, RN Phone Number: 05/02/2022, 12:53 PM   Clinical Narrative:    NCM spoke with wife and patient in the room, offered choice for HHPT, wife said he may want to do outpatient pt.  She informed this NCM that physical therapy is supposed to see him again today and that we should wait til they see him before they decide.  NCM left Medicare. Gov list with wife. Will check back. NCM checked back with wife, she states they want HHPT, she does not have a preference of agency.  NCM made referral to Cyndi with [facility] for [place].  She is able to take referral.  Soc will begin 24 to 48hrs post dc.  Informed Cyndi to call wife , [name] to set up apt times at [phone].         Patient Goals and CMS Choice      Discharge Placement                         Discharge Plan and Services Additional resources added to the After Visit Summary for                                       Social Determinants of Health (SDOH) Interventions SDOH Screenings   Food Insecurity: No Food Insecurity (04/28/2022)  Housing: Low Risk  (04/28/2022)  Transportation Needs: No Transportation Needs (04/28/2022)  Utilities: Not At Risk (04/28/2022)  Alcohol Screen: Low Risk  (04/07/2018)  Depression (PHQ2-9): Low Risk  (12/15/2021)  Financial Resource Strain: Low Risk  (02/09/2022)  Tobacco Use: Low Risk  (04/27/2022)     Readmission Risk Interventions     No data to display

## 2022-05-02 NOTE — Progress Notes (Signed)
Physical Therapy Treatment Patient Details Name: [name] MRN: [id number] DOB: Apr 20, 1933 Today's Date: 05/02/2022   History of Present Illness Pt is a 89 y.o. M who presents 04/27/2022 with complaints of chest pain. Admitted with Afib RVR. Significant PMH: CAD s/p PCI, HFpEF 55-60%, SAH, Stage 3b CKD.    PT Comments    Pt was seen for follow up to appt in which he had syncopal episode but now with dense repetitive testing of BP and sats/HR.  He is as follows:  Supine 134/68 pulse 73; sitting 133/82 pulse 77; standing 123/65 pulse 94; post gait 145/76 pulse 78.  Sats were in 97-99% ranges with mobility.  Pt is in bed sitting with wife, as he is being prepared for discharge to home after PT session.  Sent information on his performance to MD and encouraged wife to wait for home therapy to practice steps to upstairs with monitoring of HR.  Will continue to recommend home follow up therapy, and can certainly consider outpatient afterward if further endurance and strength work needs to be done.  Follow acutely as his stay permits for goals of PT on POC.   Recommendations for follow up therapy are one component of a multi-disciplinary discharge planning process, led by the attending physician.  Recommendations may be updated based on patient status, additional functional criteria and insurance authorization.  Follow Up Recommendations       Assistance Recommended at Discharge Frequent or constant Supervision/Assistance  Patient can return home with the following A little help with walking and/or transfers;A little help with bathing/dressing/bathroom;Assistance with cooking/housework;Direct supervision/assist for medications management;Direct supervision/assist for financial management;Assist for transportation;Help with stairs or ramp for entrance   Equipment Recommendations  None recommended by PT    Recommendations for Other Services       Precautions / Restrictions  Precautions Precautions: Fall Precaution Comments: monitor BP and pulses Restrictions Weight Bearing Restrictions: No     Mobility  Bed Mobility Overal bed mobility: Needs Assistance Bed Mobility: Supine to Sit, Sit to Supine     Supine to sit: Min guard, Min assist Sit to supine: Min assist        Transfers Overall transfer level: Needs assistance Equipment used: Rollator (4 wheels) Transfers: Sit to/from Stand Sit to Stand: Min guard           General transfer comment: min guard after mult attempts with pt demonstrating low effort then finally really used power up    Ambulation/Gait Ambulation/Gait assistance: Min guard Gait Distance (Feet): 45 Feet Assistive device: Rollator (4 wheels) Gait Pattern/deviations: Step-through pattern, Decreased stride length, Wide base of support Gait velocity: reduced Gait velocity interpretation: <1.31 ft/sec, indicative of household ambulator Pre-gait activities: standing ck of HR and BP General Gait Details: had cues for posture and control of walker but overall is making good use of the AD   Stairs             Wheelchair Mobility    Modified Rankin (Stroke Patients Only)       Balance Overall balance assessment: Needs assistance Sitting-balance support: Feet supported Sitting balance-Leahy Scale: Good   Postural control: Posterior lean (mild) Standing balance support: Bilateral upper extremity supported Standing balance-Leahy Scale: Poor Standing balance comment: requires UE support but better than initial eval                            Cognition Arousal/Alertness: Awake/alert Behavior During Therapy: WFL for tasks  assessed/performed Overall Cognitive Status: History of cognitive impairments - at baseline                                 General Comments: pt is up to move and making good effort but motor behavior inconsistent and requires redirection to get best effort at times         Exercises      General Comments General comments (skin integrity, edema, etc.): Pt is seen for evaluation of vitals and mobility independence with good performance for both, messaged to MD and nursing as well as dc team      Pertinent Vitals/Pain Pain Assessment Pain Assessment: Faces Faces Pain Scale: Hurts a little bit Pain Location: R ankle soreness Pain Descriptors / Indicators: Guarding    Home Living                          Prior Function            PT Goals (current goals can now be found in the care plan section) Acute Rehab PT Goals Patient Stated Goal: pt and spouse agree to PT Progress towards PT goals: Progressing toward goals    Frequency    Min 1X/week      PT Plan Frequency needs to be updated    Co-evaluation              AM-PAC PT "6 Clicks" Mobility   Outcome Measure  Help needed turning from your back to your side while in a flat bed without using bedrails?: None Help needed moving from lying on your back to sitting on the side of a flat bed without using bedrails?: A Little Help needed moving to and from a bed to a chair (including a wheelchair)?: A Little Help needed standing up from a chair using your arms (e.g., wheelchair or bedside chair)?: A Little Help needed to walk in hospital room?: A Little Help needed climbing 3-5 steps with a railing? : Total 6 Click Score: 17    End of Session Equipment Utilized During Treatment: Gait belt Activity Tolerance: Patient tolerated treatment well Patient left: in bed;with bed alarm set;with call bell/phone within reach Nurse Communication: Mobility status;Other (comment) (vitals to move) PT Visit Diagnosis: Unsteadiness on feet (R26.81);Difficulty in walking, not elsewhere classified (R26.2)     Time: [id number] PT Time Calculation (min) (ACUTE ONLY): 33 min  Charges:  $Gait Training: 8-22 mins $Therapeutic Activity: 8-22 mins         Ramond Dial 05/02/2022, 2:45  PM  Mee Hives, PT PhD Acute Rehab Dept. Number: [place] and [place]

## 2022-05-02 NOTE — Evaluation (Signed)
Occupational Therapy Evaluation Patient Details Name: [name] MRN: [id number] DOB: Apr 29, 1933 Today's Date: 05/02/2022   History of Present Illness Pt is a 89 y.o. M who presents 04/27/2022 with complaints of chest pain. Admitted with Afib RVR. Significant PMH: CAD s/p PCI, HFpEF 55-60%, SAH, Stage 3b CKD.   Clinical Impression   Pt seen to identify post-acute therapy needs. Pt presenting with decreased balance, cognition, activity tolerance, safety, and sequencing of BADL. Pt requires up to mod A for UB and LB ADL. Wife indep cueing throughout, but with decreased knowledge of protection of her own body while assisting using proper body mechanics; grateful for basic education provided during session. Pt and wife report recent change in functional status. Recommending HHOT to optimize safety and independence in ADL.      Recommendations for follow up therapy are one component of a multi-disciplinary discharge planning process, led by the attending physician.  Recommendations may be updated based on patient status, additional functional criteria and insurance authorization.   Assistance Recommended at Discharge Frequent or constant Supervision/Assistance  Patient can return home with the following A little help with walking and/or transfers;A lot of help with bathing/dressing/bathroom;Assistance with cooking/housework;Direct supervision/assist for medications management;Direct supervision/assist for financial management;Assist for transportation;Help with stairs or ramp for entrance    Functional Status Assessment  Patient has had a recent decline in their functional status and demonstrates the ability to make significant improvements in function in a reasonable and predictable amount of time.  Equipment Recommendations  None recommended by OT    Recommendations for Other Services       Precautions / Restrictions Precautions Precautions: Fall Precaution Comments: monitor BP and  pulses Restrictions Weight Bearing Restrictions: No      Mobility Bed Mobility Overal bed mobility: Needs Assistance             General bed mobility comments: IN recliner on arrival and depature    Transfers Overall transfer level: Needs assistance Equipment used: Rollator (4 wheels) Transfers: Sit to/from Stand Sit to Stand: Min guard           General transfer comment: Min guard A for safety. Good hand placement and safety overall      Balance Overall balance assessment: Needs assistance Sitting-balance support: Feet supported Sitting balance-Leahy Scale: Good   Postural control: Posterior lean (mild) Standing balance support: Bilateral upper extremity supported Standing balance-Leahy Scale: Poor Standing balance comment: requires UE support                           ADL either performed or assessed with clinical judgement   ADL Overall ADL's : Needs assistance/impaired Eating/Feeding: Set up;Sitting   Grooming: Set up;Sitting   Upper Body Bathing: Sitting;Moderate assistance   Lower Body Bathing: Minimal assistance;Sit to/from stand   Upper Body Dressing : Moderate assistance;Sitting;Cueing for sequencing   Lower Body Dressing: Moderate assistance;Sit to/from stand Lower Body Dressing Details (indicate cue type and reason): MOd A for socks and shoes Toilet Transfer: Min guard;Ambulation;Rollator (4 wheels)   Toileting- Clothing Manipulation and Hygiene: Supervision/safety;Sitting/lateral lean       Functional mobility during ADLs: Min guard;Rollator (4 wheels)       Vision   Additional Comments: Not formally assessed. Able to locate items needed for dressing     Perception Perception Perception Tested?: No   Praxis Praxis Praxis tested?: Not tested    Pertinent Vitals/Pain Pain Assessment Pain Assessment: Faces Faces Pain Scale: Hurts  a little bit Pain Location: R ankle soreness Pain Descriptors / Indicators:  Guarding Pain Intervention(s): Limited activity within patient's tolerance, Monitored during session     Hand Dominance Right   Extremity/Trunk Assessment Upper Extremity Assessment Upper Extremity Assessment: RUE deficits/detail;LUE deficits/detail RUE Deficits / Details: Shoulder flexion to ~100 degrees LUE Deficits / Details: shoulder flexion to ~110 degrees   Lower Extremity Assessment Lower Extremity Assessment: Defer to PT evaluation   Cervical / Trunk Assessment Cervical / Trunk Assessment: Kyphotic   Communication Communication Communication: No difficulties   Cognition Arousal/Alertness: Awake/alert Behavior During Therapy: WFL for tasks assessed/performed Overall Cognitive Status: History of cognitive impairments - at baseline                                 General Comments: Pt's wife reports he has recently needed more cues and reminders during ADL and many tasks despite the fact they have practiced have become less "natural"     General Comments  Pt is seen for evaluation of vitals and mobility independence with good performance for both, messaged to MD and nursing as well as dc team    Exercises     Shoulder Instructions      Home Living Family/patient expects to be discharged to:: Private residence Living Arrangements: Spouse/significant other;Children Available Help at Discharge:  (spouse, 16 y.o.) Type of Home: House Home Access: Stairs to enter Technical brewer of Steps: 5 Entrance Stairs-Rails: Right;Left Home Layout: Multi-level Alternate Level Stairs-Number of Steps: 13 Alternate Level Stairs-Rails: Right;Left Bathroom Shower/Tub: Teacher, early years/pre: Handicapped height Bathroom Accessibility: Yes How Accessible: Accessible via walker Home Equipment: Cane - single point;Rollator (4 wheels);Shower seat          Prior Functioning/Environment Prior Level of Function : Independent/Modified Independent              Mobility Comments: using Rollator for community ambulation ADLs Comments: supervision for IADL's due to Alzheimer's; cues and assist for ADL        OT Problem List: Decreased strength;Decreased activity tolerance;Impaired balance (sitting and/or standing);Decreased cognition;Decreased safety awareness;Decreased knowledge of use of DME or AE;Decreased knowledge of precautions      OT Treatment/Interventions: Self-care/ADL training;Therapeutic exercise;DME and/or AE instruction;Patient/family education;Balance training;Therapeutic activities;Cognitive remediation/compensation    OT Goals(Current goals can be found in the care plan section) Acute Rehab OT Goals Patient Stated Goal: go home OT Goal Formulation: With patient/family Time For Goal Achievement: 05/16/22 Potential to Achieve Goals: Good  OT Frequency: Min 2X/week    Co-evaluation              AM-PAC OT "6 Clicks" Daily Activity     Outcome Measure Help from another person eating meals?: A Little Help from another person taking care of personal grooming?: A Little Help from another person toileting, which includes using toliet, bedpan, or urinal?: A Little Help from another person bathing (including washing, rinsing, drying)?: A Lot Help from another person to put on and taking off regular upper body clothing?: A Lot Help from another person to put on and taking off regular lower body clothing?: A Lot 6 Click Score: 15   End of Session Equipment Utilized During Treatment: Rollator (4 wheels) Nurse Communication: Mobility status;Other (comment) (HHOT recommended)  Activity Tolerance: Patient tolerated treatment well Patient left: in chair;with call bell/phone within reach;with family/visitor present  OT Visit Diagnosis: Unsteadiness on feet (R26.81);Muscle weakness (generalized) (M62.81);Other abnormalities of  gait and mobility (R26.89);Other symptoms and signs involving cognitive function                 Time: 1522-1540 OT Time Calculation (min): 18 min Charges:  OT General Charges $OT Visit: 1 Visit OT Evaluation $OT Eval Low Complexity: 1 Low  Elder Cyphers, OTR/L [facility] Acute Rehabilitation Office: [phone]   Magnus Ivan 05/02/2022, 3:48 PM

## 2022-05-02 NOTE — Progress Notes (Signed)
PT Cancellation Note  Patient Details Name: [name] MRN: [id number] DOB: [birth year]   Cancelled Treatment:    Reason Eval/Treat Not Completed: Other (comment).  PT order discontinued today.  Re-consult if further needs are found.   Ramond Dial 05/02/2022, 12:16 PM  Mee Hives, PT PhD Acute Rehab Dept. Number: [place]

## 2022-05-02 NOTE — Progress Notes (Signed)
Pt eval and OT eval complete. PIV removed. Discharge instructions completed. Patient verbalized understanding of medication regimen, follow up appointments and discharge instructions. Patient belongings gathered and packed to discharge.

## 2022-05-02 NOTE — TOC Progression Note (Signed)
Transition of Care [facility]) - Progression Note    Patient Details  Name: [name] MRN: [id number] Date of Birth: 10/09/1933  [facility] CM/SW Contact  Zenon Mayo, RN Phone Number: 05/02/2022, 11:36 AM  Clinical Narrative:     From home with wife, presents with afib.  Physical Therapy to see patient today, await recs.       Expected Discharge Plan and Services                                               Social Determinants of Health (SDOH) Interventions SDOH Screenings   Food Insecurity: No Food Insecurity (04/28/2022)  Housing: Low Risk  (04/28/2022)  Transportation Needs: No Transportation Needs (04/28/2022)  Utilities: Not At Risk (04/28/2022)  Alcohol Screen: Low Risk  (04/07/2018)  Depression (PHQ2-9): Low Risk  (12/15/2021)  Financial Resource Strain: Low Risk  (02/09/2022)  Tobacco Use: Low Risk  (04/27/2022)    Readmission Risk Interventions     No data to display

## 2022-05-02 NOTE — Discharge Summary (Signed)
Physician Discharge Summary  JAHMAD MERCEDES [id number] DOB: May 18, 1933 DOA: 04/27/2022  PCP: [name], MD  Admit date: 04/27/2022 Discharge date: 05/02/2022 Admitted From: Home Disposition: Home Recommendations for Outpatient Follow-up:  Follow up with PCP in 1 week Cardiology to arrange outpatient follow-up Check BP, CMP and CBC at follow-up Please follow up on the following pending results: None  Home Health: PT Equipment/Devices: None  Discharge Condition: Stable CODE STATUS: Full code  Follow-up Information     [name], MD. Schedule an appointment as soon as possible for a visit in 1 week(s).   Specialty: Family Medicine Contact information: [id number] [place] [address] [phone]                 Hospital course 89 year old M with PMH of dementia, CAD/stents, diastolic CHF, subdural hematoma, CKD-3B and hematuria presenting with chest pain with associated shortness of breath and admitted for atrial fibrillation with RVR.  Patient was initially started on Cardizem drip and Lopressor and developed hypotension.  As a result, cardiology consulted and he was transitioned to amiodarone drip.  With that, heart rate improved and he was transitioned to p.o. amiodarone loading dose.  Limited TTE with LVEF of 55 to 60%, moderate concentric LVH.  On the day of discharge, patient felt well and cleared for discharge by cardiology on p.o. amiodarone 400 mg twice daily for 6 more days followed by amiodarone 200 mg daily.  Anticoagulation deferred due to risk of fall and previous history of subdural hematoma.  Cardiology to arrange outpatient heart monitor/Zio patch.  Home health PT ordered as recommended by therapy.   See individual problem list below for more.   Problems addressed during this hospitalization Principal Problem:   Atrial fibrillation with RVR (HCC) Active Problems:   Benign prostatic hyperplasia   Essential  hypertension   Stage 3b chronic kidney disease (HCC)   CAD (coronary artery disease)   History of SAH (subarachnoid hemorrhage) Paroxysmal A-fib with RVR: RVR resolved.  Converted to sinus rhythm but with intermittent A-fib. -P.o. amiodarone 400 mg daily for 6 more days followed by 200 mg daily -Anticoagulation deferred due to risk of bleeding  Chronic diastolic CHF: Stable. -Continue home Lasix and Jardiance.  Essential hypertension: Normotensive. -Lisinopril discontinued.  AKI ruled out  Obesity: BMI 30.67.  Anxiety and depression: Continue home meds.  Syncope: Likely due to metoprolol and Cardizem.  Orthostatic vitals negative.  TTE reassuring. -Lisinopril, beta-blocker and Cardizem discontinued.  Elevated troponin/demand ischemia: In the setting of A-fib with RVR.  Physical deconditioning: HH PT ordered as recommended by therapy.  BPH: Stable.  Continue home meds.  History of SAH: Stable            Time spent 45  Vital signs Vitals:   05/02/22 0550 05/02/22 0804 05/02/22 1147 05/02/22 1200  BP:   135/88   Pulse: 72  (!) 134 76  Temp: 98.1 F (36.7 C)     Resp:      Height:      Weight:      SpO2:  98% 98%   TempSrc: Oral     BMI (Calculated):         Discharge exam  GENERAL: No apparent distress.  Nontoxic. HEENT: MMM.  Vision and hearing grossly intact.  NECK: Supple.  No apparent JVD.  RESP:  No IWOB.  Fair aeration bilaterally. CVS:  RRR. Heart sounds normal.  ABD/GI/GU: BS+. Abd soft, NTND.  MSK/EXT:  Moves extremities. No apparent deformity. No edema.  SKIN: no apparent skin lesion or wound NEURO: Awake and alert. Oriented appropriately.  No apparent focal neuro deficit. PSYCH: Calm. Normal affect.   Discharge Instructions Discharge Instructions     Call MD for:  difficulty breathing, headache or visual disturbances   Complete by: As directed    Call MD for:  extreme fatigue   Complete by: As directed    Call MD for:  persistant  dizziness or light-headedness   Complete by: As directed    Diet - low sodium heart healthy   Complete by: As directed    Discharge instructions   Complete by: As directed    It has been a pleasure taking care of you!  You were hospitalized due to atrial fibrillation (rapid and irregular heart rhythm) for which you have been started on amiodarone.  Please take your medications as prescribed.  Reviewed new medication list and the directions on your medications before you take them.  Avoid any over-the-counter cold and flu medications.  Also recommend not drinking a lot of caffeine.  Follow-up with your primary care doctor in 1 to 2 weeks or sooner if needed.  Follow-up with cardiology per their recommendation.   Take care,   Increase activity slowly   Complete by: As directed       Allergies as of 05/02/2022       Reactions   Gabapentin Other (See Comments)   Visual changes and confusion- "I went blind while I was driving"        Medication List     STOP taking these medications    lisinopril 20 MG tablet Commonly known as: ZESTRIL       TAKE these medications    albuterol 108 (90 Base) MCG/ACT inhaler Commonly known as: ProAir HFA INHALE 2 PUFFS INTO THE LUNGS EVERY 6 HOURS AS NEEDED FOR WHEEZING OR SHORTNESS OF BREATH What changed:  how much to take how to take this when to take this reasons to take this   amiodarone 200 MG tablet Commonly known as: Pacerone Take 2 tablets (400 mg total) by mouth 2 (two) times daily for 6 days, THEN 1 tablet (200 mg total) daily. Start taking on: May 02, 2022   aspirin EC 81 MG tablet Take 1 tablet (81 mg total) by mouth daily. Swallow whole. Start taking on: May 03, 2022   atorvastatin 20 MG tablet Commonly known as: LIPITOR Take 1 tablet (20 mg total) by mouth daily.   doxazosin 2 MG tablet Commonly known as: CARDURA TAKE 1 TABLET BY MOUTH EVERY DAY   empagliflozin 10 MG Tabs tablet Commonly known as:  Jardiance Take 1 tablet (10 mg total) by mouth daily before breakfast.   escitalopram 10 MG tablet Commonly known as: LEXAPRO TAKE 1 TABLET BY MOUTH EVERY DAY   ezetimibe 10 MG tablet Commonly known as: ZETIA TAKE 1 TABLET BY MOUTH DAILY. What changed:  how much to take how to take this when to take this   fluticasone furoate-vilanterol 200-25 MCG/ACT Aepb Commonly known as: Breo Ellipta INHALE 1 PUFF DAILY *NEED APPT* What changed:  how much to take how to take this when to take this   furosemide 40 MG tablet Commonly known as: LASIX Take 1 tablet (40 mg total) by mouth every other day. What changed:  when to take this reasons to take this   loratadine 10 MG tablet Commonly known as: CLARITIN Take 10 mg by mouth daily as  needed for allergies or rhinitis.   meclizine 12.5 MG tablet Commonly known as: ANTIVERT Take 1 tablet (12.5 mg total) by mouth 3 (three) times daily as needed for dizziness.   memantine 5 MG tablet Commonly known as: NAMENDA TAKE 1 TABLET BY MOUTH EVERYDAY AT BEDTIME What changed: See the new instructions.   multivitamin with minerals Tabs tablet Take 1 tablet by mouth daily.        Consultations: Cardiology  Procedures/Studies:   ECHOCARDIOGRAM LIMITED  Result Date: 04/30/2022    ECHOCARDIOGRAM LIMITED REPORT   Patient Name:   [name] Date of Exam: 04/30/2022 Medical Rec #:  [id number]          Height:       68.0 in Accession #:    [id number]         Weight:       194.2 lb Date of Birth:  15-Feb-1933          BSA:          2.018 m Patient Age:    89 years           BP:           87/52 mmHg Patient Gender: M                  HR:           56 bpm. Exam Location:  Inpatient Procedure: Limited Echo and Limited Color Doppler Indications:     Elevated Troponin  History:         Patient has prior history of Echocardiogram examinations, most                  recent 01/10/2022. CAD, Arrythmias:Atrial Fibrillation; Risk                   Factors:Hypertension, Diabetes and Dyslipidemia. CKD, stage 3.  Sonographer:     Ronny Flurry Referring Phys:  [id number] [place] Diagnosing Phys: Glori Bickers MD IMPRESSIONS  1. The LV endocardium is not well visualized in any images. EF appears grossly normal.. Left ventricular ejection fraction, by estimation, is 55 to 60%. The left ventricle has normal function. Left ventricular endocardial border not optimally defined to  evaluate regional wall motion. There is moderate concentric left ventricular hypertrophy.  2. Right ventricular systolic function is normal.  3. A small pericardial effusion is present. The pericardial effusion is posterior to the left ventricle. There is no evidence of cardiac tamponade.  4. Trivial mitral valve regurgitation.  5. The aortic valve is tricuspid. There is mild calcification of the aortic valve. Aortic valve regurgitation is trivial.  6. Technically limited study due to ppor sound wave transmission. FINDINGS  Left Ventricle: The LV endocardium is not well visualized in any images. EF appears grossly normal. Left ventricular ejection fraction, by estimation, is 55 to 60%. The left ventricle has normal function. Left ventricular endocardial border not optimally defined to evaluate regional wall motion. There is moderate concentric left ventricular hypertrophy. Right Ventricle: Right ventricular systolic function is normal. Pericardium: A small pericardial effusion is present. The pericardial effusion is posterior to the left ventricle. There is no evidence of cardiac tamponade. Mitral Valve: Mild mitral annular calcification. Trivial mitral valve regurgitation. Tricuspid Valve: The tricuspid valve is not well visualized. Tricuspid valve regurgitation is trivial. Aortic Valve: The aortic valve is tricuspid. There is mild calcification of the aortic valve. Aortic valve regurgitation is trivial. LEFT VENTRICLE PLAX 2D LVIDd:  4.95 cm LVIDs:         3.70 cm LV PW:          1.20 cm LV IVS:        1.50 cm LVOT diam:     2.30 cm LVOT Area:     4.15 cm  LEFT ATRIUM         Index LA diam:    3.50 cm 1.73 cm/m   AORTA Ao Root diam: 3.70 cm Ao Asc diam:  3.50 cm  SHUNTS Systemic Diam: 2.30 cm Glori Bickers MD Electronically signed by Glori Bickers MD Signature Date/Time: 04/30/2022/12:41:27 PM    Final (Updated)    CT Angio Chest/Abd/Pel for Dissection W and/or Wo Contrast  Result Date: 04/27/2022 CLINICAL DATA:  Acute aortic syndrome suspected. EXAM: CT ANGIOGRAPHY CHEST, ABDOMEN AND PELVIS TECHNIQUE: Non-contrast CT of the chest was initially obtained. Multidetector CT imaging through the chest, abdomen and pelvis was performed using the standard protocol during bolus administration of intravenous contrast. Multiplanar reconstructed images and MIPs were obtained and reviewed to evaluate the vascular anatomy. RADIATION DOSE REDUCTION: This exam was performed according to the departmental dose-optimization program which includes automated exposure control, adjustment of the mA and/or kV according to patient size and/or use of iterative reconstruction technique. CONTRAST:  41mL OMNIPAQUE IOHEXOL 350 MG/ML SOLN COMPARISON:  CT abdomen pelvis dated 08/30/2021. FINDINGS: CTA CHEST FINDINGS Cardiovascular: There is no cardiomegaly. Small pericardial effusion measuring 7 mm in thickness. Lose three-vessel coronary vascular calcification. Moderate atherosclerotic calcification of the thoracic aorta. No aneurysmal dilatation or dissection. No pulmonary artery embolus identified. Mediastinum/Nodes: No hilar or mediastinal adenopathy. The esophagus is grossly unremarkable. There is a 1 cm right thyroid hypodense nodule. Not clinically significant; no follow-up imaging recommended (ref: J Am Coll Radiol. 2015 Feb;12(2): 143-50).No mediastinal fluid collection. Lungs/Pleura: Small left pleural effusion. There is left lung base atelectasis/scarring. Pneumonia is not excluded. A 3.5 x 1.5  cm ovoid density in the lingula, likely scarring or rounded atelectasis. This was present on the prior chest CT of 2022. There is a 3 mm stable right middle lobe nodule. No pneumothorax. The central airways are patent. Musculoskeletal: Osteopenia with degenerative changes of the spine. Old left rib fractures. No acute osseous pathology. Review of the MIP images confirms the above findings. CTA ABDOMEN AND PELVIS FINDINGS VASCULAR Aorta: Advanced atherosclerotic calcification the abdominal aorta. There is a 2.8 cm infrarenal aortic ectasia. No aneurysmal dilatation or dissection. No periaortic fluid collection. Celiac: Patent without evidence of aneurysm, dissection, vasculitis or significant stenosis. SMA: Atherosclerotic calcification of the SMA. The SMA remains patent Renals: The renal arteries are patent. IMA: Minimal flow in the IMA. Inflow: There is atherosclerotic calcification of the iliac arteries. 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 The iliac arteries remain patent. Veins: No obvious venous abnormality within the limitations of this arterial phase study. Review of the MIP images confirms the above findings. NON-VASCULAR No intra-abdominal free air or free fluid. Hepatobiliary: The liver is unremarkable. No biliary ductal dilatation. The gallbladder is unremarkable. Pancreas: There is a 2.8 x 1.7 cm hypoenhancing lesion in the distal body and tail of the pancreas which is not characterized this CT but has increased in size since 2023. No significant metabolic activity was seen on the prior PET suggestive of a possible IPMN. Further characterization with MRI, if not previously performed, is advised. No active inflammatory changes. Spleen: Normal in size without focal abnormality.  Adrenals/Urinary Tract: The adrenal glands are unremarkable. Mild bilateral renal parenchyma atrophy. There is no hydronephrosis on either side. The visualized ureters and  urinary bladder appear unremarkable. Stomach/Bowel: There is sigmoid diverticulosis without active inflammatory changes. Moderate stool throughout the colon. No bowel obstruction or active inflammation. The appendix is not visualized with certainty. No inflammatory changes identified in the right lower quadrant. Lymphatic: No adenopathy. Reproductive: Mildly enlarged prostate gland with median lobe hypertrophy. Other: None Musculoskeletal: Osteopenia with degenerative changes of the spine. No acute osseous pathology. Review of the MIP images confirms the above findings. IMPRESSION: 1. No aortic dissection or aneurysm. 2. Small left pleural effusion with left lung base atelectasis/scarring. Pneumonia is not excluded. 3. Sigmoid diverticulosis without active inflammatory changes. No bowel obstruction. 4. Interval increase in size of the hypoenhancing lesion in the distal body and tail of the pancreas. Further characterization with MRI, if not previously performed, is advised. Electronically Signed   By: Anner Crete M.D.   On: 04/27/2022 20:06   DG Chest Port 1 View  Result Date: 04/27/2022 CLINICAL DATA:  Chest pain EXAM: PORTABLE CHEST 1 VIEW COMPARISON:  03/13/2022 x-ray and older FINDINGS: Small left effusion versus pleural thickening with some nodularity and opacity, similar to previous. No pneumothorax or edema. Enlarged cardiopericardial silhouette. Calcified aorta. Overlapping cardiac leads. Degenerative changes seen of the spine and shoulders. IMPRESSION: Enlarged heart. Persistent left effusion, adjacent opacity and nodularity. Appearance is similar to previous. Please correlate with previous workup. Electronically Signed   By: Jill Side M.D.   On: 04/27/2022 18:49       The results of significant diagnostics from this hospitalization (including imaging, microbiology, ancillary and laboratory) are listed below for reference.     Microbiology: No results found for this or any previous  visit (from the past 240 hour(s)).   Labs:  CBC: Recent Labs  Lab 04/27/22 1824 04/28/22 0128  WBC 5.5 6.2  HGB 10.5* 10.3*  HCT 32.2* 31.0*  MCV 95.3 95.4  PLT 161 164   BMP &GFR Recent Labs  Lab 04/28/22 0128 04/29/22 0252 04/30/22 0204 05/01/22 0526 05/02/22 0228  NA 134* 136 134* 138 136  K 4.5 4.0 4.5 4.1 4.5  CL 101 106 103 105 104  CO2 24 22 22  20* 24  GLUCOSE 189* 98 95 99 100*  BUN 37* 45* 55* 50* 51*  CREATININE 1.95* 2.07* 2.34* 2.14* 2.16*  CALCIUM 8.5* 8.5* 8.2* 8.6* 8.4*  MG 1.9  --   --   --   --   PHOS 3.2  --   --   --   --    Estimated Creatinine Clearance: 27 mL/min (A) (by C-G formula based on SCr of 2.16 mg/dL (H)). Liver & Pancreas: Recent Labs  Lab 04/27/22 1824 04/28/22 0128  AST 24 25  ALT 14 14  ALKPHOS 70 65  BILITOT 0.6 0.7  PROT 6.6 5.5*  ALBUMIN 3.7 3.0*   Recent Labs  Lab 04/28/22 0122  LIPASE 38   No results for input(s): "AMMONIA" in the last 168 hours. Diabetic: No results for input(s): "HGBA1C" in the last 72 hours. No results for input(s): "GLUCAP" in the last 168 hours. Cardiac Enzymes: No results for input(s): "CKTOTAL", "CKMB", "CKMBINDEX", "TROPONINI" in the last 168 hours. No results for input(s): "PROBNP" in the last 8760 hours. Coagulation Profile: No results for input(s): "INR", "PROTIME" in the last 168 hours. Thyroid Function Tests: Recent Labs    04/30/22 0202  TSH 1.843  Lipid Profile: No results for input(s): "CHOL", "HDL", "LDLCALC", "TRIG", "CHOLHDL", "LDLDIRECT" in the last 72 hours. Anemia Panel: No results for input(s): "VITAMINB12", "FOLATE", "FERRITIN", "TIBC", "IRON", "RETICCTPCT" in the last 72 hours. Urine analysis:    Component Value Date/Time   COLORURINE COLORLESS (A) 04/27/2022 2152   APPEARANCEUR CLEAR 04/27/2022 2152   LABSPEC 1.017 04/27/2022 2152   PHURINE 5.5 04/27/2022 2152   GLUCOSEU 500 (A) 04/27/2022 2152   HGBUR LARGE (A) 04/27/2022 2152   BILIRUBINUR NEGATIVE  04/27/2022 2152   BILIRUBINUR neg 09/28/2012 0832   KETONESUR NEGATIVE 04/27/2022 2152   PROTEINUR TRACE (A) 04/27/2022 2152   UROBILINOGEN 0.2 09/28/2012 0832   NITRITE NEGATIVE 04/27/2022 2152   LEUKOCYTESUR NEGATIVE 04/27/2022 2152   Sepsis Labs: Invalid input(s): "PROCALCITONIN", "LACTICIDVEN"   SIGNED:  Mercy Riding, MD  [facility] 05/02/2022, 12:20 PM

## 2022-05-02 NOTE — Progress Notes (Signed)
Patient went into afib 134 for about 3-5 minutes and self converted back to sinus at 78. Dr. Cyndia Skeeters aware.

## 2022-05-03 ENCOUNTER — Encounter: Payer: Self-pay | Admitting: *Deleted

## 2022-05-03 ENCOUNTER — Telehealth: Payer: Self-pay | Admitting: *Deleted

## 2022-05-03 ENCOUNTER — Ambulatory Visit: Payer: Self-pay

## 2022-05-03 ENCOUNTER — Ambulatory Visit: Payer: PPO | Attending: Student

## 2022-05-03 ENCOUNTER — Encounter: Payer: Self-pay | Admitting: Cardiovascular Disease

## 2022-05-03 ENCOUNTER — Encounter: Payer: Self-pay | Admitting: Family Medicine

## 2022-05-03 DIAGNOSIS — I4891 Unspecified atrial fibrillation: Secondary | ICD-10-CM

## 2022-05-03 NOTE — Progress Notes (Unsigned)
Enrolled for [employer] to mail a ZIO XT long term holter monitor to the patients address on file.   Dr. Cooper to read. 

## 2022-05-03 NOTE — Chronic Care Management (AMB) (Signed)
   05/03/2022  [name] [birth year] [id number]   Reason for Encounter: Patient is not currently enrolled in the CCM program. CCM status changed to previously enrolled.   Horris Latino RN Care Manager/Chronic Care Management [phone]

## 2022-05-03 NOTE — Transitions of Care (Post Inpatient/ED Visit) (Signed)
   05/03/2022  Name: [name] MRN: [id number] DOB: 1933-11-24  Today's TOC FU Call Status: Today's TOC FU Call Status:: Unsuccessul Call (1st Attempt) Unsuccessful Call (1st Attempt) Date: 05/03/22  Attempted to reach the patient regarding the most recent Inpatient visit; left HIPAA compliant voice message requesting call back  Follow Up Plan: Additional outreach attempts will be made to reach the patient to complete the Transitions of Care (Post Inpatient visit) call.   Oneta Rack, RN, BSN, CCRN Alumnus RN CM Care Coordination/ Transition of [place] Management [phone]: direct office

## 2022-05-04 ENCOUNTER — Telehealth: Payer: Self-pay | Admitting: *Deleted

## 2022-05-04 ENCOUNTER — Encounter: Payer: Self-pay | Admitting: *Deleted

## 2022-05-04 DIAGNOSIS — I2510 Atherosclerotic heart disease of native coronary artery without angina pectoris: Secondary | ICD-10-CM | POA: Diagnosis not present

## 2022-05-04 DIAGNOSIS — N1832 Chronic kidney disease, stage 3b: Secondary | ICD-10-CM | POA: Diagnosis not present

## 2022-05-04 DIAGNOSIS — K219 Gastro-esophageal reflux disease without esophagitis: Secondary | ICD-10-CM | POA: Diagnosis not present

## 2022-05-04 DIAGNOSIS — I951 Orthostatic hypotension: Secondary | ICD-10-CM | POA: Diagnosis not present

## 2022-05-04 DIAGNOSIS — G500 Trigeminal neuralgia: Secondary | ICD-10-CM | POA: Diagnosis not present

## 2022-05-04 DIAGNOSIS — F0284 Dementia in other diseases classified elsewhere, unspecified severity, with anxiety: Secondary | ICD-10-CM | POA: Diagnosis not present

## 2022-05-04 DIAGNOSIS — I130 Hypertensive heart and chronic kidney disease with heart failure and stage 1 through stage 4 chronic kidney disease, or unspecified chronic kidney disease: Secondary | ICD-10-CM | POA: Diagnosis not present

## 2022-05-04 DIAGNOSIS — E669 Obesity, unspecified: Secondary | ICD-10-CM | POA: Diagnosis not present

## 2022-05-04 DIAGNOSIS — I4891 Unspecified atrial fibrillation: Secondary | ICD-10-CM | POA: Diagnosis not present

## 2022-05-04 DIAGNOSIS — J4540 Moderate persistent asthma, uncomplicated: Secondary | ICD-10-CM | POA: Diagnosis not present

## 2022-05-04 DIAGNOSIS — N400 Enlarged prostate without lower urinary tract symptoms: Secondary | ICD-10-CM | POA: Diagnosis not present

## 2022-05-04 DIAGNOSIS — E1122 Type 2 diabetes mellitus with diabetic chronic kidney disease: Secondary | ICD-10-CM | POA: Diagnosis not present

## 2022-05-04 DIAGNOSIS — D696 Thrombocytopenia, unspecified: Secondary | ICD-10-CM | POA: Diagnosis not present

## 2022-05-04 DIAGNOSIS — Z6832 Body mass index (BMI) 32.0-32.9, adult: Secondary | ICD-10-CM | POA: Diagnosis not present

## 2022-05-04 DIAGNOSIS — F32A Depression, unspecified: Secondary | ICD-10-CM | POA: Diagnosis not present

## 2022-05-04 DIAGNOSIS — D631 Anemia in chronic kidney disease: Secondary | ICD-10-CM | POA: Diagnosis not present

## 2022-05-04 DIAGNOSIS — E782 Mixed hyperlipidemia: Secondary | ICD-10-CM | POA: Diagnosis not present

## 2022-05-04 DIAGNOSIS — K5730 Diverticulosis of large intestine without perforation or abscess without bleeding: Secondary | ICD-10-CM | POA: Diagnosis not present

## 2022-05-04 DIAGNOSIS — G309 Alzheimer's disease, unspecified: Secondary | ICD-10-CM | POA: Diagnosis not present

## 2022-05-04 DIAGNOSIS — I252 Old myocardial infarction: Secondary | ICD-10-CM | POA: Diagnosis not present

## 2022-05-04 DIAGNOSIS — I083 Combined rheumatic disorders of mitral, aortic and tricuspid valves: Secondary | ICD-10-CM | POA: Diagnosis not present

## 2022-05-04 DIAGNOSIS — J4489 Other specified chronic obstructive pulmonary disease: Secondary | ICD-10-CM | POA: Diagnosis not present

## 2022-05-04 DIAGNOSIS — F0283 Dementia in other diseases classified elsewhere, unspecified severity, with mood disturbance: Secondary | ICD-10-CM | POA: Diagnosis not present

## 2022-05-04 DIAGNOSIS — I5033 Acute on chronic diastolic (congestive) heart failure: Secondary | ICD-10-CM | POA: Diagnosis not present

## 2022-05-04 NOTE — Transitions of Care (Post Inpatient/ED Visit) (Signed)
05/04/2022  Name: [name] MRN: [id number] DOB: [birth year]  Today's TOC FU Call Status: Today's TOC FU Call Status:: Successful TOC FU Call Competed TOC FU Call Complete Date: 05/04/22  Transition Care Management Follow-up Telephone Call Date of Discharge: 05/02/22 Discharge Facility: [facility]) Type of Discharge: Inpatient Admission Primary Inpatient Discharge Diagnosis:: A Fib with RVR How have you been since you were released from the hospital?: Better (per spouse/ caregiver, [name], verified on [facility] DPR: "He is doing very well, the PT just left our home-- he did well with the activity, got a little short of breat, but recovered within a few minutes.  No problems at this point") Any questions or concerns?: No  Items Reviewed: Did you receive and understand the discharge instructions provided?: Yes (thoroughly reviewed with patient's spouse who verbalizes excellent understanding of same) Medications obtained and verified?: Yes (Medications Reviewed) (Full medication review completed; no concerns or discrepancies identified; confirmed patient obtained/ is taking all newly Rx'd medications as instructed; self-manages meds with spouse assist and denies questions/ concerns around medications today) Any new allergies since your discharge?: No Dietary orders reviewed?: Yes Type of Diet Ordered:: Heart Healthy/ low salt Do you have support at home?: Yes People in Home: spouse Name of Support/Comfort Primary Source: spouse provides assistance with all care activities at home, as needed/ indicated post-hospital discharge; patient able to perfrom much of self-care independently at baseline, prior to hospitalization: hoping to return to baseline "soon"  Home Care and Equipment/Supplies: [place] Ordered?: Yes [place]:: [facility]- PT; reports also has [facility] who comes to visit patient in his home- established prior to recent hospital  admission Has Agency set up a time to come to your home?: Yes [place] Visit Date: 05/04/22 ("They just left a few minutes ago") Any new equipment or medical supplies ordered?: No  Functional Questionnaire: Do you need assistance with bathing/showering or dressing?: Yes (wife/ spouse assists as needed/ indicated post- recent hospital discharge) Do you need assistance with meal preparation?: Yes (wife/ spouse assists as needed/ indicated) Do you need assistance with eating?: No Do you have difficulty maintaining continence: No Do you need assistance with getting out of bed/getting out of a chair/moving?: Yes (wife/ spouse assists as needed/ indicated post- recent hospital discharge) Do you have difficulty managing or taking your medications?: Yes (wife/ spouse assists as needed/ indicated post- recent hospital discharge; at baseline prior to recent hospital visit- patient self- managed)  Follow up appointments reviewed: PCP Follow-up appointment confirmed?: Yes Date of PCP follow-up appointment?: 05/10/22 Follow-up Provider: PCP [facility] Follow-up appointment confirmed?: Yes (care coordination outreach in real-time with scheduling care guide to successfully schedule hospital follow up PCP appointment 05/10/22) Date of Specialist follow-up appointment?: 05/24/22 Follow-Up Specialty Provider:: cardiology provider Do you need transportation to your follow-up appointment?: No Do you understand care options if your condition(s) worsen?: Yes-patient verbalized understanding  SDOH Interventions Today    Flowsheet Row Most Recent Value  SDOH Interventions   Food Insecurity Interventions Intervention Not Indicated  Transportation Interventions Intervention Not Indicated  [spouse provides all transportation]      TOC Interventions Today    Flowsheet Row Most Recent Value  TOC Interventions   TOC Interventions Discussed/Reviewed TOC Interventions Discussed, Arranged PCP  follow up within 7 days/Care Guide scheduled  [Patient declines need for ongoing/ further care coordination outreach,  no care coordination needs identified at time of TOC call today,  provided my  direct contact information should questions/ concerns/ needs arise post-TOC call]      Interventions Today    Flowsheet Row Most Recent Value  Chronic Disease   Chronic disease during today's visit Atrial Fibrillation (AFib), Other  [dementia]  General Interventions   General Interventions Discussed/Reviewed General Interventions Discussed, Doctor Visits, Durable Medical Equipment (DME)  Doctor Visits Discussed/Reviewed PCP, Specialist, Doctor Visits Discussed  Durable Medical Equipment (DME) Gilford Rile  PCP/Specialist Visits Compliance with follow-up visit  Exercise Interventions   Exercise Discussed/Reviewed Exercise Discussed  [home health PT- encouraged patient's ongoing participation/ engagement]  Education Interventions   Education Provided Provided Education  Provided Verbal Education On Other, Insurance Plans, Medication, When to see the doctor, Exercise  [Clarified current post-discharge dosing of diuretic,  reinforced importance of daily weight monitoring/ recording at home with action plan for weight gain/ signs- symptoms fluid retention]  Nutrition Interventions   Nutrition Discussed/Reviewed Nutrition Discussed  Pharmacy Interventions   Pharmacy Dicussed/Reviewed Pharmacy Topics Discussed  [Full medication review with updating medication list in EHR per patient report]  Safety Interventions   Safety Discussed/Reviewed Safety Discussed      Oneta Rack, RN, BSN, CCRN Alumnus RN CM Care Coordination/ Transition of [place] Management [phone]: direct office

## 2022-05-05 ENCOUNTER — Encounter (HOSPITAL_BASED_OUTPATIENT_CLINIC_OR_DEPARTMENT_OTHER): Payer: Self-pay | Admitting: Emergency Medicine

## 2022-05-05 ENCOUNTER — Emergency Department (HOSPITAL_BASED_OUTPATIENT_CLINIC_OR_DEPARTMENT_OTHER): Payer: PPO | Admitting: Radiology

## 2022-05-05 ENCOUNTER — Other Ambulatory Visit: Payer: Self-pay

## 2022-05-05 ENCOUNTER — Observation Stay (HOSPITAL_BASED_OUTPATIENT_CLINIC_OR_DEPARTMENT_OTHER)
Admission: EM | Admit: 2022-05-05 | Discharge: 2022-05-06 | Disposition: A | Discharge status: Home Health | Payer: PPO | Attending: Internal Medicine | Admitting: Internal Medicine

## 2022-05-05 DIAGNOSIS — Z79899 Other long term (current) drug therapy: Secondary | ICD-10-CM | POA: Insufficient documentation

## 2022-05-05 DIAGNOSIS — Z7982 Long term (current) use of aspirin: Secondary | ICD-10-CM | POA: Diagnosis not present

## 2022-05-05 DIAGNOSIS — E782 Mixed hyperlipidemia: Secondary | ICD-10-CM | POA: Diagnosis present

## 2022-05-05 DIAGNOSIS — R079 Chest pain, unspecified: Secondary | ICD-10-CM | POA: Diagnosis not present

## 2022-05-05 DIAGNOSIS — J45909 Unspecified asthma, uncomplicated: Secondary | ICD-10-CM | POA: Diagnosis not present

## 2022-05-05 DIAGNOSIS — I5032 Chronic diastolic (congestive) heart failure: Secondary | ICD-10-CM | POA: Insufficient documentation

## 2022-05-05 DIAGNOSIS — I2510 Atherosclerotic heart disease of native coronary artery without angina pectoris: Secondary | ICD-10-CM | POA: Diagnosis present

## 2022-05-05 DIAGNOSIS — Z7984 Long term (current) use of oral hypoglycemic drugs: Secondary | ICD-10-CM | POA: Diagnosis not present

## 2022-05-05 DIAGNOSIS — I4891 Unspecified atrial fibrillation: Secondary | ICD-10-CM | POA: Diagnosis present

## 2022-05-05 DIAGNOSIS — F0280 Dementia in other diseases classified elsewhere without behavioral disturbance: Secondary | ICD-10-CM | POA: Diagnosis not present

## 2022-05-05 DIAGNOSIS — I130 Hypertensive heart and chronic kidney disease with heart failure and stage 1 through stage 4 chronic kidney disease, or unspecified chronic kidney disease: Secondary | ICD-10-CM | POA: Insufficient documentation

## 2022-05-05 DIAGNOSIS — I480 Paroxysmal atrial fibrillation: Secondary | ICD-10-CM | POA: Insufficient documentation

## 2022-05-05 DIAGNOSIS — R0789 Other chest pain: Secondary | ICD-10-CM | POA: Diagnosis not present

## 2022-05-05 DIAGNOSIS — N1832 Chronic kidney disease, stage 3b: Secondary | ICD-10-CM | POA: Diagnosis not present

## 2022-05-05 DIAGNOSIS — G309 Alzheimer's disease, unspecified: Secondary | ICD-10-CM | POA: Insufficient documentation

## 2022-05-05 DIAGNOSIS — R071 Chest pain on breathing: Secondary | ICD-10-CM

## 2022-05-05 DIAGNOSIS — E1122 Type 2 diabetes mellitus with diabetic chronic kidney disease: Secondary | ICD-10-CM | POA: Diagnosis not present

## 2022-05-05 DIAGNOSIS — I10 Essential (primary) hypertension: Secondary | ICD-10-CM | POA: Diagnosis present

## 2022-05-05 DIAGNOSIS — Z955 Presence of coronary angioplasty implant and graft: Secondary | ICD-10-CM | POA: Diagnosis not present

## 2022-05-05 DIAGNOSIS — N183 Chronic kidney disease, stage 3 unspecified: Secondary | ICD-10-CM | POA: Diagnosis present

## 2022-05-05 DIAGNOSIS — I609 Nontraumatic subarachnoid hemorrhage, unspecified: Secondary | ICD-10-CM

## 2022-05-05 DIAGNOSIS — E119 Type 2 diabetes mellitus without complications: Secondary | ICD-10-CM

## 2022-05-05 HISTORY — DX: Dementia in other diseases classified elsewhere, unspecified severity, without behavioral disturbance, psychotic disturbance, mood disturbance, and anxiety: F02.80

## 2022-05-05 LAB — CBC
HCT: 37.1 % — ABNORMAL LOW (ref 39.0–52.0)
HCT: 39 % (ref 39.0–52.0)
Hemoglobin: 11.6 g/dL — ABNORMAL LOW (ref 13.0–17.0)
Hemoglobin: 12.1 g/dL — ABNORMAL LOW (ref 13.0–17.0)
MCH: 30.9 pg (ref 26.0–34.0)
MCH: 31 pg (ref 26.0–34.0)
MCHC: 29.7 g/dL — ABNORMAL LOW (ref 30.0–36.0)
MCHC: 32.6 g/dL (ref 30.0–36.0)
MCV: 103.7 fL — ABNORMAL HIGH (ref 80.0–100.0)
MCV: 95.1 fL (ref 80.0–100.0)
Platelets: 217 10*3/uL (ref 150–400)
Platelets: 274 10*3/uL (ref 150–400)
RBC: 3.76 MIL/uL — ABNORMAL LOW (ref 4.22–5.81)
RBC: 3.9 MIL/uL — ABNORMAL LOW (ref 4.22–5.81)
RDW: 13 % (ref 11.5–15.5)
RDW: 13.1 % (ref 11.5–15.5)
WBC: 7.5 10*3/uL (ref 4.0–10.5)
WBC: 8.8 10*3/uL (ref 4.0–10.5)
nRBC: 0 % (ref 0.0–0.2)
nRBC: 0 % (ref 0.0–0.2)

## 2022-05-05 LAB — TROPONIN I (HIGH SENSITIVITY)
Troponin I (High Sensitivity): 44 ng/L — ABNORMAL HIGH (ref <18)
Troponin I (High Sensitivity): 52 ng/L — ABNORMAL HIGH (ref <18)

## 2022-05-05 LAB — BASIC METABOLIC PANEL
Anion gap: 12 (ref 5–15)
BUN: 61 mg/dL — ABNORMAL HIGH (ref 8–23)
CO2: 24 mmol/L (ref 22–32)
Calcium: 9.6 mg/dL (ref 8.9–10.3)
Chloride: 101 mmol/L (ref 98–111)
Creatinine, Ser: 2.6 mg/dL — ABNORMAL HIGH (ref 0.61–1.24)
GFR, Estimated: 23 mL/min — ABNORMAL LOW (ref >60)
Glucose, Bld: 108 mg/dL — ABNORMAL HIGH (ref 70–99)
Potassium: 4.5 mmol/L (ref 3.5–5.1)
Sodium: 137 mmol/L (ref 135–145)

## 2022-05-05 LAB — CREATININE, SERUM
Creatinine, Ser: 2.24 mg/dL — ABNORMAL HIGH (ref 0.61–1.24)
GFR, Estimated: 28 mL/min — ABNORMAL LOW (ref >60)

## 2022-05-05 LAB — GLUCOSE, CAPILLARY
Glucose-Capillary: 139 mg/dL — ABNORMAL HIGH (ref 70–99)
Glucose-Capillary: 136 mg/dL — ABNORMAL HIGH (ref 70–99)
Glucose-Capillary: 176 mg/dL — ABNORMAL HIGH (ref 70–99)

## 2022-05-05 LAB — CBG MONITORING, ED: Glucose-Capillary: 142 mg/dL — ABNORMAL HIGH (ref 70–99)

## 2022-05-05 MED ORDER — NITROGLYCERIN 0.4 MG SL SUBL
SUBLINGUAL_TABLET | SUBLINGUAL | Status: AC
  Filled 2022-05-05: qty 1

## 2022-05-05 MED ORDER — ACETAMINOPHEN 325 MG PO TABS: 650.0000 mg | ORAL_TABLET | Freq: Once | ORAL | Status: DC

## 2022-05-05 MED ORDER — DOCUSATE SODIUM 100 MG PO CAPS
100.0000 mg | ORAL_CAPSULE | Freq: Two times a day (BID) | ORAL | Status: DC
  Administered 2022-05-05 – 2022-05-06 (×2): 100 mg via ORAL
  Filled 2022-05-05 (×3): qty 1

## 2022-05-05 MED ORDER — DICYCLOMINE HCL 10 MG/5ML PO SOLN: 10.0000 mg | Freq: Once | ORAL | Status: DC | PRN

## 2022-05-05 MED ORDER — ONDANSETRON HCL 4 MG/2ML IJ SOLN: 4.0000 mg | Freq: Four times a day (QID) | INTRAMUSCULAR | Status: DC | PRN

## 2022-05-05 MED ORDER — HYDROMORPHONE HCL 1 MG/ML IJ SOLN
0.5000 mg | Freq: Once | INTRAMUSCULAR | Status: AC
  Administered 2022-05-05: 0.5 mg via INTRAVENOUS
  Filled 2022-05-05: qty 1

## 2022-05-05 MED ORDER — MORPHINE SULFATE (PF) 2 MG/ML IV SOLN
2.0000 mg | Freq: Once | INTRAVENOUS | Status: AC
  Administered 2022-05-05: 2 mg via INTRAVENOUS
  Filled 2022-05-05: qty 1

## 2022-05-05 MED ORDER — ASPIRIN 81 MG PO TBEC
81.0000 mg | DELAYED_RELEASE_TABLET | Freq: Every day | ORAL | Status: DC
  Administered 2022-05-05 – 2022-05-06 (×2): 81 mg via ORAL
  Filled 2022-05-05 (×2): qty 1

## 2022-05-05 MED ORDER — MEMANTINE HCL 10 MG PO TABS
5.0000 mg | ORAL_TABLET | Freq: Every day | ORAL | Status: DC
  Administered 2022-05-05: 5 mg via ORAL
  Filled 2022-05-05: qty 1

## 2022-05-05 MED ORDER — ACETAMINOPHEN 325 MG PO TABS
650.0000 mg | ORAL_TABLET | Freq: Four times a day (QID) | ORAL | Status: DC | PRN
  Administered 2022-05-05 – 2022-05-06 (×2): 650 mg via ORAL
  Filled 2022-05-05 (×2): qty 2

## 2022-05-05 MED ORDER — ATORVASTATIN CALCIUM 20 MG PO TABS
20.0000 mg | ORAL_TABLET | Freq: Every day | ORAL | Status: DC
  Administered 2022-05-05 – 2022-05-06 (×2): 20 mg via ORAL
  Filled 2022-05-05 (×2): qty 1

## 2022-05-05 MED ORDER — NITROGLYCERIN 0.3 MG SL SUBL
0.3000 mg | SUBLINGUAL_TABLET | Freq: Once | SUBLINGUAL | Status: AC
  Administered 2022-05-05: 0.3 mg via SUBLINGUAL
  Filled 2022-05-05: qty 100

## 2022-05-05 MED ORDER — EMPAGLIFLOZIN 10 MG PO TABS
10.0000 mg | ORAL_TABLET | Freq: Every day | ORAL | Status: DC
  Administered 2022-05-06: 10 mg via ORAL
  Filled 2022-05-05: qty 1

## 2022-05-05 MED ORDER — ALUM & MAG HYDROXIDE-SIMETH 200-200-20 MG/5ML PO SUSP
30.0000 mL | Freq: Once | ORAL | Status: AC
  Administered 2022-05-05: 30 mL via ORAL
  Filled 2022-05-05: qty 30

## 2022-05-05 MED ORDER — DOXAZOSIN MESYLATE 2 MG PO TABS
2.0000 mg | ORAL_TABLET | Freq: Every day | ORAL | Status: DC
  Administered 2022-05-05 – 2022-05-06 (×2): 2 mg via ORAL
  Filled 2022-05-05 (×2): qty 1

## 2022-05-05 MED ORDER — MORPHINE SULFATE (PF) 2 MG/ML IV SOLN: 2.0000 mg | INTRAVENOUS | Status: DC | PRN

## 2022-05-05 MED ORDER — ESCITALOPRAM OXALATE 10 MG PO TABS
10.0000 mg | ORAL_TABLET | Freq: Every day | ORAL | Status: DC
  Administered 2022-05-05 – 2022-05-06 (×2): 10 mg via ORAL
  Filled 2022-05-05 (×2): qty 1

## 2022-05-05 MED ORDER — LORATADINE 10 MG PO TABS: 10.0000 mg | ORAL_TABLET | Freq: Every day | ORAL | Status: DC | PRN

## 2022-05-05 MED ORDER — AMIODARONE HCL 200 MG PO TABS: 200.0000 mg | ORAL_TABLET | Freq: Every day | ORAL | Status: DC

## 2022-05-05 MED ORDER — FAMOTIDINE IN NACL 20-0.9 MG/50ML-% IV SOLN
20.0000 mg | Freq: Once | INTRAVENOUS | Status: AC
  Administered 2022-05-05: 20 mg via INTRAVENOUS
  Filled 2022-05-05: qty 50

## 2022-05-05 MED ORDER — EZETIMIBE 10 MG PO TABS
10.0000 mg | ORAL_TABLET | Freq: Every day | ORAL | Status: DC
  Administered 2022-05-05 – 2022-05-06 (×2): 10 mg via ORAL
  Filled 2022-05-05 (×2): qty 1

## 2022-05-05 MED ORDER — ENOXAPARIN SODIUM 30 MG/0.3ML IJ SOSY
30.0000 mg | PREFILLED_SYRINGE | Freq: Every day | INTRAMUSCULAR | Status: DC
  Administered 2022-05-05 – 2022-05-06 (×2): 30 mg via SUBCUTANEOUS
  Filled 2022-05-05 (×2): qty 0.3

## 2022-05-05 MED ORDER — ACETAMINOPHEN 650 MG RE SUPP: 650.0000 mg | Freq: Four times a day (QID) | RECTAL | Status: DC | PRN

## 2022-05-05 MED ORDER — ONDANSETRON HCL 4 MG PO TABS: 4.0000 mg | ORAL_TABLET | Freq: Four times a day (QID) | ORAL | Status: DC | PRN

## 2022-05-05 MED ORDER — INSULIN ASPART 100 UNIT/ML IJ SOLN: 0.0000 [IU] | Freq: Every day | INTRAMUSCULAR | Status: DC

## 2022-05-05 MED ORDER — NITROGLYCERIN 0.4 MG SL SUBL
0.4000 mg | SUBLINGUAL_TABLET | Freq: Once | SUBLINGUAL | Status: AC
  Administered 2022-05-05: 0.4 mg via SUBLINGUAL
  Filled 2022-05-05: qty 1

## 2022-05-05 MED ORDER — INSULIN ASPART 100 UNIT/ML IJ SOLN
0.0000 [IU] | Freq: Three times a day (TID) | INTRAMUSCULAR | Status: DC
  Administered 2022-05-05 – 2022-05-06 (×2): 2 [IU] via SUBCUTANEOUS

## 2022-05-05 MED ORDER — POLYETHYLENE GLYCOL 3350 17 G PO PACK: 17.0000 g | PACK | Freq: Every day | ORAL | Status: DC | PRN

## 2022-05-05 MED ORDER — AMIODARONE HCL 200 MG PO TABS
400.0000 mg | ORAL_TABLET | Freq: Two times a day (BID) | ORAL | Status: DC
  Administered 2022-05-05 – 2022-05-06 (×3): 400 mg via ORAL
  Filled 2022-05-05 (×3): qty 2

## 2022-05-05 MED ORDER — ONDANSETRON HCL 4 MG/2ML IJ SOLN
4.0000 mg | Freq: Once | INTRAMUSCULAR | Status: AC
  Administered 2022-05-05: 4 mg via INTRAVENOUS
  Filled 2022-05-05: qty 2

## 2022-05-05 NOTE — Progress Notes (Signed)
Received patient from [place], VS obtained, telemetry intact, call light placed in reach

## 2022-05-05 NOTE — Plan of Care (Signed)
  Problem: Pain Managment: Goal: General experience of comfort will improve Outcome: Progressing   Problem: Safety: Goal: Ability to remain free from injury will improve Outcome: Progressing   

## 2022-05-05 NOTE — ED Notes (Signed)
Pt's wife states pt has moderate Alzheimers. Pt 's baseline is poor judgement ,unsafe to be left alone,pt's wife states she can stay with  patient. Pt is able to ambulate with assistance but has been deconditioned since his last hospital admission. CBG 142 this am.

## 2022-05-05 NOTE — ED Notes (Signed)
Spoke w/ Dr. Sedonia Small who said to hold the hydromorphone for now

## 2022-05-05 NOTE — H&P (Signed)
History and Physical  [name] [id number] DOB: [birth year] DOA: 05/05/2022  PCP: [name], MD   Chief Complaint: Chest pain  HPI: [name] is a 89 y.o. male with medical history significant for coronary artery disease/cardiac stent, chronic diastolic congestive heart failure, history of subdural hematoma, CKD stage IIIb, non-insulin-dependent type 2 diabetes who was recently discharged from this facility on 3/27 with complaints of chest pain found to have paroxysmal atrial fibrillation discharged on amiodarone, and is now returning to the hospital with complaints of sharp stabbing severe chest pain.  The pain is certainly atypical, history is provided mostly by his wife, states it seems that the pain seems to come and go randomly, but in general been pretty constant.  Pain is in the center of his back, radiating to the front of his chest.  At times feels like a pressure, at times feels like a stabbing.  Does not seem to be worse with deep breaths.  Does not seem to be worse with exertion.  ED Course: Patient presented to the emergency department, vital signs showed sinus rhythm heart rate in the 60s, blood pressure minimally elevated 145/68.  Saturating 95% on room air.  Lab work Was done and revealed troponin of 52, down trended to 44.  Note that on previous admission troponin was as high as 1114.  He received some nitroglycerin and morphine, says pain got better after that.  Today's creatinine is 2.60, elevated from his baseline of about 2.1.  Patient was given GI cocktail in the ER, but threw it up.  He is still having some chest discomfort, though during my interview he is spending most of the time sleeping comfortably.  Review of Systems: Please see HPI for pertinent positives and negatives. A complete 10 system review of systems are otherwise negative.  Past Medical History:  Diagnosis Date   Acute blood loss anemia    Acute upper respiratory infections of  unspecified site 11/04/2012   AKI (acute kidney injury) ([place])    Alzheimer disease ([place])    Benign prostatic hyperplasia    CAD (coronary artery disease)    s/p cypher DES to pLAD 6/08; normal LVF;  ETT-Myoview 2009: no ischemia    Clavicle fracture 08/10/2019   Closed displaced fracture of phalanx of left thumb, sequela 08/10/2019   Coronary atherosclerosis of native coronary artery 03/08/2008   Dizziness 03/08/2020   Eosinophilia    Essential hypertension    Fracture of multiple ribs with pain 08/10/2019   Hematoma 11/04/2012   History of multiple falls    History of SAH (subarachnoid hemorrhage) 07/14/2019   Hyperlipidemia type IIB / III 03/08/2008   Itching of ear 07/14/2021   Major neurocognitive disorder due to possible Alzheimer's disease, without behavioral disturbance ([place]) 02/29/2020   MI (myocardial infarction) ([place])    Moderate persistent asthma    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   Imaging revealed [facility]   Multiple trauma    Nocturnal leg cramps 11/23/2012   Orthostatic hypotension 03/08/2008   Otalgia of left ear 05/11/2021   Otorrhagia of left ear 03/30/2021   Pain in joint, shoulder region 11/23/2012   Stage 3b chronic kidney disease ([place])    Thrombocytopenia ([place])    Trigeminal neuralgia    Type 2 diabetes mellitus ([place])    Past Surgical History:  Procedure Laterality Date   APPENDECTOMY     CARDIAC CATHETERIZATION  07/30/2006   CORONARY ANGIOPLASTY WITH STENT PLACEMENT  CARDIAC CATHETERIZATION     CATARACT EXTRACTION Right 03/2022   CATARACT EXTRACTION Left 04/2022   EXPLORATORY LAPAROTOMY     age 4   EXPLORATORY LAPAROTOMY     IR THORACENTESIS ASP PLEURAL SPACE W/IMG GUIDE  10/05/2019    Social History:  reports that he has never smoked. He has never used smokeless tobacco. He reports that he does not currently use alcohol. He reports that he does not use drugs.   Allergies  Allergen Reactions   Gabapentin  Other (See Comments)    Visual changes and confusion- "I went blind while I was driving"    Family History  Problem Relation Age of Onset   Stomach cancer Mother    Memory loss Mother    Heart disease Father    Coronary artery disease Father 47   Heart attack Father    Heart disease Brother    Aortic aneurysm Brother    Colon cancer Brother    Aortic aneurysm Brother    Colon cancer Brother    Arthritis Daughter        rheumatoid   Coronary artery disease Brother    Esophageal cancer Neg Hx    Rectal cancer Neg Hx      Prior to Admission medications   Medication Sig Start Date End Date Taking? Authorizing Provider  albuterol (PROAIR HFA) 108 (90 Base) MCG/ACT inhaler INHALE 2 PUFFS INTO THE LUNGS EVERY 6 HOURS AS NEEDED FOR WHEEZING OR SHORTNESS OF BREATH Patient taking differently: Inhale 2 puffs into the lungs every 6 (six) hours as needed for wheezing or shortness of breath. INHALE 2 PUFFS INTO THE LUNGS EVERY 6 HOURS AS NEEDED FOR WHEEZING OR SHORTNESS OF BREATH 08/06/19   Angiulli, Lavon Paganini, PA-C  amiodarone (PACERONE) 200 MG tablet Take 2 tablets (400 mg total) by mouth 2 (two) times daily for 6 days, THEN 1 tablet (200 mg total) daily. 05/02/22 07/31/22  Mercy Riding, MD  aspirin EC 81 MG tablet Take 1 tablet (81 mg total) by mouth daily. Swallow whole. 05/03/22   Mercy Riding, MD  atorvastatin (LIPITOR) 20 MG tablet Take 1 tablet (20 mg total) by mouth daily. 08/17/21   [name], MD  doxazosin (CARDURA) 2 MG tablet TAKE 1 TABLET BY MOUTH EVERY DAY Patient taking differently: Take 2 mg by mouth daily. 01/18/22   [name], MD  empagliflozin (JARDIANCE) 10 MG TABS tablet Take 1 tablet (10 mg total) by mouth daily before breakfast. 01/16/22   [name], MD  escitalopram (LEXAPRO) 10 MG tablet TAKE 1 TABLET BY MOUTH EVERY DAY Patient taking differently: Take 10 mg by mouth daily. 01/19/22   [name], MD  ezetimibe (ZETIA) 10 MG tablet TAKE 1 TABLET  BY MOUTH DAILY. Patient taking differently: Take 10 mg by mouth daily. TAKE 1 TABLET BY MOUTH DAILY. 05/24/21   Sherren Mocha, MD  fluticasone furoate-vilanterol (BREO ELLIPTA) 200-25 MCG/ACT AEPB INHALE 1 PUFF DAILY *NEED APPT* Patient taking differently: Inhale 1 puff into the lungs daily. INHALE 1 PUFF DAILY *NEED APPT* 01/16/22   [name], MD  furosemide (LASIX) 40 MG tablet Take 1 tablet (40 mg total) by mouth every other day. Patient taking differently: Take 40 mg by mouth daily as needed for fluid or edema. 12/25/21   Sherren Mocha, MD  loratadine (CLARITIN) 10 MG tablet Take 10 mg by mouth daily as needed for allergies or rhinitis.     [provider]  meclizine (  ANTIVERT) 12.5 MG tablet Take 1 tablet (12.5 mg total) by mouth 3 (three) times daily as needed for dizziness. 09/28/20   Lajean Saver, MD  memantine (NAMENDA) 5 MG tablet TAKE 1 TABLET BY MOUTH EVERYDAY AT BEDTIME Patient taking differently: Take 5 mg by mouth at bedtime. 04/09/22   Rondel Jumbo, PA-C  Multiple Vitamin (MULTIVITAMIN WITH MINERALS) TABS tablet Take 1 tablet by mouth daily.    [provider]    Physical Exam: BP (!) 145/68 (BP Location: Left Arm)   Pulse 65   Temp 98.2 F (36.8 C) (Axillary)   Resp 16   Ht 5\' 8"  (1.727 m)   Wt 87.6 kg   SpO2 94%   BMI 29.38 kg/m   General:  Alert, oriented, calm, in no acute distress, sleeping soundly my arrival and easily arousable Eyes: EOMI, clear conjuctivae, white sclerea Neck: supple, no masses, trachea mildline  Cardiovascular: RRR, no murmurs or rubs, no peripheral edema  Respiratory: clear to auscultation bilaterally, no wheezes, no crackles  Abdomen: soft, nontender, nondistended, normal bowel tones heard  Skin: dry, no rashes  Musculoskeletal: no joint effusions, normal range of motion  Psychiatric: appropriate affect, normal speech  Neurologic: extraocular muscles intact, clear speech, moving all extremities with intact  sensorium          Labs on Admission:  Basic Metabolic Panel: Recent Labs  Lab 04/29/22 0252 04/30/22 0204 05/01/22 0526 05/02/22 0228 05/05/22 0025  NA 136 134* 138 136 137  K 4.0 4.5 4.1 4.5 4.5  CL 106 103 105 104 101  CO2 22 22 20* 24 24  GLUCOSE 98 95 99 100* 108*  BUN 45* 55* 50* 51* 61*  CREATININE 2.07* 2.34* 2.14* 2.16* 2.60*  CALCIUM 8.5* 8.2* 8.6* 8.4* 9.6   Liver Function Tests: No results for input(s): "AST", "ALT", "ALKPHOS", "BILITOT", "PROT", "ALBUMIN" in the last 168 hours. No results for input(s): "LIPASE", "AMYLASE" in the last 168 hours. No results for input(s): "AMMONIA" in the last 168 hours. CBC: Recent Labs  Lab 05/05/22 0025  WBC 7.5  HGB 12.1*  HCT 37.1*  MCV 95.1  PLT 274   Cardiac Enzymes: No results for input(s): "CKTOTAL", "CKMB", "CKMBINDEX", "TROPONINI" in the last 168 hours.  BNP (last 3 results) Recent Labs    04/02/22 1145 04/20/22 1211 04/27/22 1824  BNP 146* 159* 316.9*    ProBNP (last 3 results) No results for input(s): "PROBNP" in the last 8760 hours.  CBG: Recent Labs  Lab 05/05/22 0804  GLUCAP 142*    Radiological Exams on Admission: DG Chest 2 View  Result Date: 05/05/2022 CLINICAL DATA:  Chest pain EXAM: CHEST - 2 VIEW COMPARISON:  04/27/2022 FINDINGS: Cardiac shadow is enlarged but stable. Aortic calcifications are noted. Atelectatic changes are noted in the lingula and left lower lobe stable from the recent CT. No acute bony abnormality is noted. IMPRESSION: Stable atelectatic changes in the lingula and left lower lobe. No new focal abnormality is noted. Electronically Signed   By: Inez Catalina M.D.   On: 05/05/2022 01:08    Assessment/Plan Principal Problem:   Chest pain -the etiology is unclear at this time, certainly atypical pain, EKG without concerning findings, he is currently in sinus rhythm, troponin is only marginally elevated.  During last hospital stay, was seen by cardiology for chest pain and  elevated troponins, it was felt that the troponin leak was due to sustained RVR at that time, in the setting of CKD.  Diagnosis of  PE was considered, however the patient is not tachycardic, not hypoxic, he is hemodynamically stable, and chest pain is not pleuritic and so this diagnosis is felt to be quite unlikely.  Given the fact that the pain is coming in spasms, and also improved with nitroglycerin, I do wonder if this is esophageal spasm. -Observation admission with telemetry monitoring -Continue to trend troponin -In case of recurrence of chest pain, would obtain EKG, and give a trial of GI cocktail -If pain persists could consider GI consultation for possible endoscopic evaluation  Active Problems:   Hyperlipidemia type IIB / III continue Lipitor   Type 2 diabetes mellitus (HCC)-diabetic diet when eating, with sliding scale   Essential hypertension-continue Cardura   CAD (coronary artery disease)   History of SAH (subarachnoid hemorrhage) Dementia-continue Namenda at bedtime   Atrial fibrillation with history of RVR (HCC)-continue amiodarone 400 twice daily, reduce dose to 200 mg daily on 4/3   CKD (chronic kidney disease) stage 3, GFR 30-59 ml/min (HCC)   DVT prophylaxis: Lovenox     Code Status: Full Code  Consults called: None  Admission status: Observation  Time spent: 46 minutes  Izaac Reisig Neva Seat MD [facility] Pager [phone]  If 7PM-7AM, please contact night-coverage [email or web address] Password [email or web address]  05/05/2022, 10:40 AM

## 2022-05-05 NOTE — ED Triage Notes (Signed)
Crushing chest pain  Started 2 hours ago Appears uncomfortable, SOB in triage

## 2022-05-05 NOTE — ED Notes (Signed)
Pt. Medicated, bed linens changed, recliner brought in room for wife. Pt. Resting.

## 2022-05-05 NOTE — ED Provider Notes (Signed)
[facility] Emergency Department Provider Note MRN:  [id number]  Arrival date & time: 05/05/22     Chief Complaint   Chest Pain   History of Present Illness   ALBON BOGDANSKI is a 89 y.o. year-old male with a history of CAD, A-fib presenting to the ED with chief complaint of chest pain.  Central chest pain with radiation through to the back.  Started about 45 minutes ago, pain is severe.  Associated with mild shortness of breath.  Had the same type of pain last week and was admitted for A-fib.  Review of Systems  A thorough review of systems was obtained and all systems are negative except as noted in the HPI and PMH.   Patient's Health History    Past Medical History:  Diagnosis Date   Acute blood loss anemia    Acute upper respiratory infections of unspecified site 11/04/2012   AKI (acute kidney injury)    Benign prostatic hyperplasia    CAD (coronary artery disease)    s/p cypher DES to pLAD 6/08; normal LVF;  ETT-Myoview 2009: no ischemia    Clavicle fracture 08/10/2019   Closed displaced fracture of phalanx of left thumb, sequela 08/10/2019   Coronary atherosclerosis of native coronary artery 03/08/2008   Dizziness 03/08/2020   Eosinophilia    Essential hypertension    Fracture of multiple ribs with pain 08/10/2019   Hematoma 11/04/2012   History of multiple falls    History of SAH (subarachnoid hemorrhage) 07/14/2019   Hyperlipidemia type IIB / III 03/08/2008   Itching of ear 07/14/2021   Major neurocognitive disorder due to possible Alzheimer's disease, without behavioral disturbance 02/29/2020   MI (myocardial infarction)    Moderate persistent asthma    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   Imaging revealed [facility]   Multiple trauma    Nocturnal leg cramps 11/23/2012   Orthostatic hypotension 03/08/2008   Otalgia of left ear 05/11/2021   Otorrhagia of left ear 03/30/2021   Pain in joint, shoulder region  11/23/2012   Stage 3b chronic kidney disease    Thrombocytopenia    Trigeminal neuralgia    Type 2 diabetes mellitus     Past Surgical History:  Procedure Laterality Date   APPENDECTOMY     CARDIAC CATHETERIZATION  07/30/2006   CORONARY ANGIOPLASTY WITH STENT PLACEMENT   CARDIAC CATHETERIZATION     CATARACT EXTRACTION Right 03/2022   CATARACT EXTRACTION Left 04/2022   EXPLORATORY LAPAROTOMY     age 71   EXPLORATORY LAPAROTOMY     IR THORACENTESIS ASP PLEURAL SPACE W/IMG GUIDE  10/05/2019    Family History  Problem Relation Age of Onset   Stomach cancer Mother    Memory loss Mother    Heart disease Father    Coronary artery disease Father 77   Heart attack Father    Heart disease Brother    Aortic aneurysm Brother    Colon cancer Brother    Aortic aneurysm Brother    Colon cancer Brother    Arthritis Daughter        rheumatoid   Coronary artery disease Brother    Esophageal cancer Neg Hx    Rectal cancer Neg Hx     Social History   Socioeconomic History   Marital status: Married    Spouse name: [name]   Number of children: 1   Years of education: 20   Highest education level: Doctorate  Occupational History   Occupation:  retired professor    Comment: History    Occupation: missionary  Tobacco Use   Smoking status: Never   Smokeless tobacco: Never  Vaping Use   Vaping Use: Never used  Substance and Sexual Activity   Alcohol use: Not Currently   Drug use: Never   Sexual activity: Yes    Partners: Female  Other Topics Concern   Not on file  Social History Narrative   ** Merged History Encounter **       Lives with his wife (second marriage in 2015, first marriage ended when his wife died alzheimer's disease). Since his remarriage, his adult daughter doesn't speak with him.      Right handed   Social Determinants of Health   Financial Resource Strain: Low Risk  (02/09/2022)   Overall Financial Resource Strain (CARDIA)    Difficulty of Paying Living  Expenses: Not hard at all  Food Insecurity: No Food Insecurity (05/04/2022)   Hunger Vital Sign    Worried About Running Out of Food in the Last Year: Never true    Ran Out of Food in the Last Year: Never true  Transportation Needs: No Transportation Needs (05/04/2022)   PRAPARE - Hydrologist (Medical): No    Lack of Transportation (Non-Medical): No  Physical Activity: Not on file  Stress: Not on file  Social Connections: Not on file  Intimate Partner Violence: Not At Risk (04/28/2022)   Humiliation, Afraid, Rape, and Kick questionnaire    Fear of Current or Ex-Partner: No    Emotionally Abused: No    Physically Abused: No    Sexually Abused: No     Physical Exam   Vitals:   05/05/22 0154 05/05/22 0215  BP: (!) 138/56 124/61  Pulse: 65 62  Resp: 19 13  Temp:    SpO2: 98% 96%    CONSTITUTIONAL: Well-appearing, NAD NEURO/PSYCH:  Alert and oriented x 3, no focal deficits EYES:  eyes equal and reactive ENT/NECK:  no LAD, no JVD CARDIO: Regular rate, well-perfused, normal S1 and S2 PULM:  CTAB no wheezing or rhonchi GI/GU:  non-distended, non-tender MSK/SPINE:  No gross deformities, no edema SKIN:  no rash, atraumatic   *Additional and/or pertinent findings included in MDM below  Diagnostic and Interventional Summary    EKG Interpretation  Date/Time:  Saturday May 05 2022 00:21:47 EDT Ventricular Rate:  70 PR Interval:  158 QRS Duration: 114 QT Interval:  404 QTC Calculation: 436 R Axis:   -21 Text Interpretation: Sinus rhythm Borderline intraventricular conduction delay Confirmed by Gerlene Fee [phone]) on 05/05/2022 12:41:53 AM       Labs Reviewed  BASIC METABOLIC PANEL - Abnormal; Notable for the following components:      Result Value   Glucose, Bld 108 (*)    BUN 61 (*)    Creatinine, Ser 2.60 (*)    GFR, Estimated 23 (*)    All other components within normal limits  CBC - Abnormal; Notable for the following components:    RBC 3.90 (*)    Hemoglobin 12.1 (*)    HCT 37.1 (*)    All other components within normal limits  TROPONIN I (HIGH SENSITIVITY) - Abnormal; Notable for the following components:   Troponin I (High Sensitivity) 52 (*)    All other components within normal limits  TROPONIN I (HIGH SENSITIVITY) - Abnormal; Notable for the following components:   Troponin I (High Sensitivity) 44 (*)    All other components within  normal limits    DG Chest 2 View  Final Result      Medications  famotidine (PEPCID) IVPB 20 mg premix (20 mg Intravenous New Bag/Given 05/05/22 0429)  ondansetron (ZOFRAN) injection 4 mg (has no administration in time range)  HYDROmorphone (DILAUDID) injection 0.5 mg (0.5 mg Intravenous Given 05/05/22 0150)  nitroGLYCERIN (NITROSTAT) SL tablet 0.4 mg (0.4 mg Sublingual Given 05/05/22 0037)  morphine (PF) 2 MG/ML injection 2 mg (2 mg Intravenous Given 05/05/22 0425)  alum & mag hydroxide-simeth (MAALOX/MYLANTA) 200-200-20 MG/5ML suspension 30 mL (30 mLs Oral Given 05/05/22 0424)     Procedures  /  Critical Care Procedures  ED Course and Medical Decision Making  Initial Impression and Ddx Chest pain with radiation through to the back.  Similar presentation on 3-22.  Was admitted for A-fib with RVR.  Did well according to chart review.  Was transition to oral amiodarone and was discharged.  He underwent CT dissection study during this ED visit on the 22nd and there was no evidence of dissection.  This is a very similar pain.  Dissection of course considered but felt to be unlikely.  Considering ACS, intermittent A-fib.  Past medical/surgical history that increases complexity of ED encounter: A-fib, see ID, dementia  Interpretation of Diagnostics I personally reviewed the EKG and my interpretation is as follows: Sinus rhythm without concerning ischemic features  Labs reveal no significant blood count or electrolyte disturbance.  Troponin minimally elevated at 52, downtrending on  repeat.  Patient Reassessment and Ultimate Disposition/Management     Patient was continued endorsement of chest pain.  Still highly doubt dissection.  Completely normal vital signs, most of the time sitting comfortably in bed.  Equal pulses radially.  Case discussed with Dr. Koleen Nimrod of cardiology, plan is for hospitalist admission for observation.  Patient management required discussion with the following services or consulting groups:  Hospitalist Service  Complexity of Problems Addressed Acute illness or injury that poses threat of life of bodily function  Additional Data Reviewed and Analyzed Further history obtained from: Further history from spouse/family member  Additional Factors Impacting ED Encounter Risk Consideration of hospitalization  Barth Kirks. Sedonia Small, MD [place] [email or web address]  Final Clinical Impressions(s) / ED Diagnoses     ICD-10-CM   1. Chest pain, unspecified type  R07.9       ED Discharge Orders     None        Discharge Instructions Discussed with and Provided to Patient:   Discharge Instructions   None      Maudie Flakes, MD 05/05/22 [phone]

## 2022-05-05 NOTE — Progress Notes (Signed)
Plan of Care Note for accepted transfer   Patient: [name] MRN: [id number]   DOA: 05/05/2022  Facility requesting transfer: [facility]  Requesting Provider: Dr. Sedonia Small Reason for transfer: Chest pain, rule out ACS Facility course:  [name] is a 89y.o. male with history of coronary artery disease, atrial fibrillation, type 2 diabetes mellitus, essential hypertension, Alzheimer's dementia, BPH, stage IIIb chronic kidney disease, and dyslipidemia, who presented to the emergency room with acute onset of central chest pain with radiation to his back that started 45 minutes prior to arrival to the ED and he described it as severe.  He had associated dyspnea.  He had similar type of chest pain last week and was admitted for atrial fibrillation with RVR.  When he came to the ER, BP was 186/67 and later 146/60 with otherwise normal vital signs.  Labs revealed a BUN of 61 and creatinine of 2.6 above previous levels, high sensitive troponin I 5244 and CBC showed mild anemia.  2 view chest x-ray showed stable atelectatic changes in the lingula and the left lower lobe with no new focal abnormalities seen. EKG showed sinus rhythm with a rate of 70 with borderline interventricular conduction delay and poor R wave progression.  He was given iv MSO4, Dilaudid, Zofran, SL NG and Maalox as well as Pepcid.   Plan of care: The patient is accepted for admission to Telemetry unit, at [facility]..   He will stay under the care and responsibility of the EDP till arrival to [facility].  Author: Christel Mormon, MD 05/05/2022  Check [email or web address] for on-call coverage.  Nursing staff, Please call [place] number on Amion as soon as patient's arrival, so appropriate admitting provider can evaluate the pt.

## 2022-05-06 DIAGNOSIS — R071 Chest pain on breathing: Secondary | ICD-10-CM | POA: Diagnosis not present

## 2022-05-06 LAB — GLUCOSE, CAPILLARY
Glucose-Capillary: 119 mg/dL — ABNORMAL HIGH (ref 70–99)
Glucose-Capillary: 128 mg/dL — ABNORMAL HIGH (ref 70–99)
Glucose-Capillary: 157 mg/dL — ABNORMAL HIGH (ref 70–99)

## 2022-05-06 NOTE — TOC Progression Note (Signed)
Transition of Care [facility]) - Progression Note    Patient Details  Name: CORBIT NIEBEL MRN: [id number] Date of Birth: [birth year]  Transition of Care [facility]) CM/SW Contact  Rodney Booze, LCSW Phone Number: 05/06/2022, 3:19 PM  Clinical Narrative:    CSW has set up home health with [facility]. TOC will continue to follow.        Expected Discharge Plan and Services         Expected Discharge Date: 05/06/22                                     Social Determinants of Health (SDOH) Interventions SDOH Screenings   Food Insecurity: No Food Insecurity (05/05/2022)  Housing: Low Risk  (05/05/2022)  Transportation Needs: No Transportation Needs (05/05/2022)  Utilities: Not At Risk (05/05/2022)  Alcohol Screen: Low Risk  (04/07/2018)  Depression (PHQ2-9): Low Risk  (12/15/2021)  Financial Resource Strain: Low Risk  (02/09/2022)  Tobacco Use: Low Risk  (05/05/2022)    Readmission Risk Interventions     No data to display

## 2022-05-06 NOTE — Progress Notes (Signed)
Patient discharged: Home with family  Via: Wheelchair   Discharge paperwork given: to patient and family  Reviewed with teach back  IV and telemetry disconnected  Belongings given to patient    

## 2022-05-06 NOTE — Evaluation (Signed)
Physical Therapy Evaluation Patient Details Name: [name] MRN: [id number] DOB: 03/03/1933 Today's Date: 05/06/2022  History of Present Illness  Pt is a 89 y.o. M who presents with complaints of atypical chest pain. PMH: CAD s/p PCI, HFpEF 55-60%, SAH, Stage 3b CKD, dementia  Clinical Impression  Patient evaluated by Physical Therapy with no further acute PT needs identified. All education has been completed and the patient has no further questions.  Pt agreeable to PT,  amb hallway distance with one standing rest break. HR 80s to 90s. Pt would benefit from continued PT at d/c  See below for any follow-up Physical Therapy or equipment needs. PT is signing off. Thank you for this referral.        Recommendations for follow up therapy are one component of a multi-disciplinary discharge planning process, led by the attending physician.  Recommendations may be updated based on patient status, additional functional criteria and insurance authorization.  Follow Up Recommendations       Assistance Recommended at Discharge Frequent or constant Supervision/Assistance  Patient can return home with the following  A little help with walking and/or transfers;A little help with bathing/dressing/bathroom;Assistance with cooking/housework;Direct supervision/assist for medications management;Direct supervision/assist for financial management;Assist for transportation;Help with stairs or ramp for entrance    Equipment Recommendations None recommended by PT  Recommendations for Other Services       Functional Status Assessment Patient has had a recent decline in their functional status and demonstrates the ability to make significant improvements in function in a reasonable and predictable amount of time.     Precautions / Restrictions Precautions Precautions: Fall      Mobility  Bed Mobility Overal bed mobility: Needs Assistance Bed Mobility: Supine to Sit     Supine to sit: Min  assist, HOB elevated     General bed mobility comments: assist and incr time to elevate trunk    Transfers Overall transfer level: Needs assistance Equipment used: Rolling walker (2 wheels) Transfers: Sit to/from Stand Sit to Stand: Min guard           General transfer comment: min guard assist for safety, cues for hand placement  and to power up  with LEs    Ambulation/Gait Ambulation/Gait assistance: Min guard, Min assist Gait Distance (Feet): 120 Feet Assistive device: Rolling walker (2 wheels) Gait Pattern/deviations: Step-through pattern, Decreased stride length, Wide base of support, Trunk flexed       General Gait Details: verbal cues for walker position, trunk extension and incr step length. intermittent assist to maneuver RW and balance, unsteady hwoever no overt LOB; occasional cues for steady speed, pt tends to decr and then rapidly incr gait velocity  Stairs            Wheelchair Mobility    Modified Rankin (Stroke Patients Only)       Balance Overall balance assessment: Needs assistance Sitting-balance support: No upper extremity supported, Feet supported Sitting balance-Leahy Scale: Fair     Standing balance support: During functional activity, Reliant on assistive device for balance Standing balance-Leahy Scale: Poor Standing balance comment: requires UE support                             Pertinent Vitals/Pain Pain Assessment Pain Assessment: Faces Faces Pain Scale: Hurts a little bit Pain Location: back Pain Descriptors / Indicators: Discomfort Pain Intervention(s): Limited activity within patient's tolerance, Monitored during session    Home Living Family/patient expects  to be discharged to:: Private residence Living Arrangements: Spouse/significant other Available Help at Discharge: Available 24 hours/day;Family Type of Home: House Home Access: Stairs to enter Entrance Stairs-Rails: Psychiatric nurse  of Steps: 5 Alternate Level Stairs-Number of Steps: [place] - single point;Rollator (4 wheels);Shower seat Additional Comments: plans to sleep on main level in recliner/lift chair initially.    Prior Function Prior Level of Function : Independent/Modified Independent             Mobility Comments: using Rollator for community ambulation, wife provides supervision/min/guard since recent admission ADLs Comments: supervision for IADL's due to Alzheimer's; cues and assist for ADL     Hand Dominance        Extremity/Trunk Assessment   Upper Extremity Assessment Upper Extremity Assessment:  (NT)    Lower Extremity Assessment Lower Extremity Assessment: Generalized weakness    Cervical / Trunk Assessment Cervical / Trunk Assessment: Kyphotic  Communication   Communication: No difficulties  Cognition Arousal/Alertness: Awake/alert Behavior During Therapy: WFL for tasks assessed/performed Overall Cognitive Status: History of cognitive impairments - at baseline                                 General Comments: pt at baseline per wife        General Comments      Exercises     Assessment/Plan    PT Assessment All further PT needs can be met in the next venue of care  PT Problem List         PT Treatment Interventions      PT Goals (Current goals can be found in the Care Plan section)  Acute Rehab PT Goals Patient Stated Goal: pt and spouse agree to PT and HHPT PT Goal Formulation: All assessment and education complete, DC therapy (pt to d/c home after PT visit)    Frequency       Co-evaluation               AM-PAC PT "6 Clicks" Mobility  Outcome Measure Help needed turning from your back to your side while in a flat bed without using bedrails?: A Little Help needed moving from lying on your back to sitting on the side of a flat bed without using bedrails?: A Little Help needed moving to and from a bed to a chair  (including a wheelchair)?: A Little Help needed standing up from a chair using your arms (e.g., wheelchair or bedside chair)?: A Little Help needed to walk in hospital room?: A Little Help needed climbing 3-5 steps with a railing? : A Lot 6 Click Score: 17    End of Session Equipment Utilized During Treatment: Gait belt Activity Tolerance: Patient tolerated treatment well Patient left: with call bell/phone within reach;in chair;with chair alarm set;with family/visitor present   PT Visit Diagnosis: Unsteadiness on feet (R26.81);Difficulty in walking, not elsewhere classified (R26.2)    Time: [id number] PT Time Calculation (min) (ACUTE ONLY): 21 min   Charges:   PT Evaluation $PT Eval Low Complexity: Foss, PT  Acute Rehab Dept ([facility]) [phone]  05/06/2022   [facility] 05/06/2022, 2:30 PM

## 2022-05-06 NOTE — Discharge Summary (Signed)
Physician Discharge Summary  [name] [id number] DOB: 1933/05/15 DOA: 05/05/2022  PCP: [name], MD  Admit date: 05/05/2022 Discharge date: 05/06/2022  Admitted From: Home Disposition:  Home  Recommendations for Outpatient Follow-up:  Follow up with PCP in 1-2 weeks  Home Health:Resume PT  Equipment/Devices:No new equipment  Discharge Condition:Stable  CODE STATUS:Full  Diet recommendation: Regular diet    Brief/Interim Summary: [name] is a 89y.o. male with history of coronary artery disease, atrial fibrillation, type 2 diabetes mellitus, essential hypertension, Alzheimer's dementia, BPH, stage IIIb chronic kidney disease, and dyslipidemia, who presented to the emergency room with acute onset of central chest pain with radiation to his back that started 45 minutes prior to arrival to the ED and he described it as severe.  He had associated dyspnea.  He had similar type of chest pain last week and was admitted for atrial fibrillation with RVR.  Patient admitted as above for atypical chest pain - appears to be pleuritic in nature - now resolved. Patient does have a history of reflux which could possibly be contributing to his atypical symptoms. At this time given resolved symptoms and negative workup otherwise he is stable and reasonable to discharge home to resume home health/PT.  Discharge Diagnoses:  Principal Problem:   Chest pain Active Problems:   Hyperlipidemia type IIB / III   Type 2 diabetes mellitus (HCC)   Essential hypertension   CAD (coronary artery disease)   History of SAH (subarachnoid hemorrhage)   Atrial fibrillation with RVR (HCC)   CKD (chronic kidney disease) stage 3, GFR 30-59 ml/min [facility])    Discharge Instructions  Discharge Instructions     Discharge patient   Complete by: As directed    Resume HHPT   Discharge disposition: 06-Home-Health Care Svc   Discharge patient date: 05/06/2022      Allergies as of 05/06/2022        Reactions   Gabapentin Other (See Comments)   Visual changes and confusion- "I went blind while I was driving"        Medication List     TAKE these medications    albuterol 108 (90 Base) MCG/ACT inhaler Commonly known as: ProAir HFA INHALE 2 PUFFS INTO THE LUNGS EVERY 6 HOURS AS NEEDED FOR WHEEZING OR SHORTNESS OF BREATH What changed:  how much to take how to take this when to take this reasons to take this   amiodarone 200 MG tablet Commonly known as: PACERONE Take 200 mg by mouth 2 (two) times daily. What changed: Another medication with the same name was removed. Continue taking this medication, and follow the directions you see here.   aspirin EC 81 MG tablet Take 1 tablet (81 mg total) by mouth daily. Swallow whole.   atorvastatin 20 MG tablet Commonly known as: LIPITOR Take 1 tablet (20 mg total) by mouth daily.   doxazosin 2 MG tablet Commonly known as: CARDURA TAKE 1 TABLET BY MOUTH EVERY DAY   empagliflozin 10 MG Tabs tablet Commonly known as: Jardiance Take 1 tablet (10 mg total) by mouth daily before breakfast.   escitalopram 10 MG tablet Commonly known as: LEXAPRO TAKE 1 TABLET BY MOUTH EVERY DAY   ezetimibe 10 MG tablet Commonly known as: ZETIA TAKE 1 TABLET BY MOUTH DAILY. What changed:  how much to take how to take this when to take this   fluticasone furoate-vilanterol 200-25 MCG/ACT Aepb Commonly known as: Breo Ellipta INHALE 1 PUFF DAILY *NEED APPT* What changed:  how much to take how to take this when to take this   furosemide 40 MG tablet Commonly known as: LASIX Take 1 tablet (40 mg total) by mouth every other day.   loratadine 10 MG tablet Commonly known as: CLARITIN Take 10 mg by mouth daily as needed for allergies or rhinitis.   meclizine 12.5 MG tablet Commonly known as: ANTIVERT Take 1 tablet (12.5 mg total) by mouth 3 (three) times daily as needed for dizziness.   memantine 5 MG tablet Commonly known as:  NAMENDA TAKE 1 TABLET BY MOUTH EVERYDAY AT BEDTIME What changed: See the new instructions.   multivitamin with minerals Tabs tablet Take 1 tablet by mouth daily.        Allergies  Allergen Reactions   Gabapentin Other (See Comments)    Visual changes and confusion- "I went blind while I was driving"    Consultations: None   Procedures/Studies: DG Chest 2 View  Result Date: 05/05/2022 CLINICAL DATA:  Chest pain EXAM: CHEST - 2 VIEW COMPARISON:  04/27/2022 FINDINGS: Cardiac shadow is enlarged but stable. Aortic calcifications are noted. Atelectatic changes are noted in the lingula and left lower lobe stable from the recent CT. No acute bony abnormality is noted. IMPRESSION: Stable atelectatic changes in the lingula and left lower lobe. No new focal abnormality is noted. Electronically Signed   By: Inez Catalina M.D.   On: 05/05/2022 01:08   ECHOCARDIOGRAM LIMITED  Result Date: 04/30/2022    ECHOCARDIOGRAM LIMITED REPORT   Patient Name:   [name] Date of Exam: 04/30/2022 Medical Rec #:  [id number]          Height:       68.0 in Accession #:    [id number]         Weight:       194.2 lb Date of Birth:  06-Jan-1934          BSA:          2.018 m Patient Age:    59 years           BP:           87/52 mmHg Patient Gender: M                  HR:           56 bpm. Exam Location:  Inpatient Procedure: Limited Echo and Limited Color Doppler Indications:     Elevated Troponin  History:         Patient has prior history of Echocardiogram examinations, most                  recent 01/10/2022. CAD, Arrythmias:Atrial Fibrillation; Risk                  Factors:Hypertension, Diabetes and Dyslipidemia. CKD, stage 3.  Sonographer:     Ronny Flurry Referring Phys:  [id number] [place] Diagnosing Phys: Glori Bickers MD IMPRESSIONS  1. The LV endocardium is not well visualized in any images. EF appears grossly normal.. Left ventricular ejection fraction, by estimation, is 55 to 60%. The left  ventricle has normal function. Left ventricular endocardial border not optimally defined to  evaluate regional wall motion. There is moderate concentric left ventricular hypertrophy.  2. Right ventricular systolic function is normal.  3. A small pericardial effusion is present. The pericardial effusion is posterior to the left ventricle. There is no evidence of cardiac tamponade.  4. Trivial mitral valve regurgitation.  5. The aortic valve is tricuspid. There is mild calcification of the aortic valve. Aortic valve regurgitation is trivial.  6. Technically limited study due to ppor sound wave transmission. FINDINGS  Left Ventricle: The LV endocardium is not well visualized in any images. EF appears grossly normal. Left ventricular ejection fraction, by estimation, is 55 to 60%. The left ventricle has normal function. Left ventricular endocardial border not optimally defined to evaluate regional wall motion. There is moderate concentric left ventricular hypertrophy. Right Ventricle: Right ventricular systolic function is normal. Pericardium: A small pericardial effusion is present. The pericardial effusion is posterior to the left ventricle. There is no evidence of cardiac tamponade. Mitral Valve: Mild mitral annular calcification. Trivial mitral valve regurgitation. Tricuspid Valve: The tricuspid valve is not well visualized. Tricuspid valve regurgitation is trivial. Aortic Valve: The aortic valve is tricuspid. There is mild calcification of the aortic valve. Aortic valve regurgitation is trivial. LEFT VENTRICLE PLAX 2D LVIDd:         4.95 cm LVIDs:         3.70 cm LV PW:         1.20 cm LV IVS:        1.50 cm LVOT diam:     2.30 cm LVOT Area:     4.15 cm  LEFT ATRIUM         Index LA diam:    3.50 cm 1.73 cm/m   AORTA Ao Root diam: 3.70 cm Ao Asc diam:  3.50 cm  SHUNTS Systemic Diam: 2.30 cm Glori Bickers MD Electronically signed by Glori Bickers MD Signature Date/Time: 04/30/2022/12:41:27 PM    Final  (Updated)    CT Angio Chest/Abd/Pel for Dissection W and/or Wo Contrast  Result Date: 04/27/2022 CLINICAL DATA:  Acute aortic syndrome suspected. EXAM: CT ANGIOGRAPHY CHEST, ABDOMEN AND PELVIS TECHNIQUE: Non-contrast CT of the chest was initially obtained. Multidetector CT imaging through the chest, abdomen and pelvis was performed using the standard protocol during bolus administration of intravenous contrast. Multiplanar reconstructed images and MIPs were obtained and reviewed to evaluate the vascular anatomy. RADIATION DOSE REDUCTION: This exam was performed according to the departmental dose-optimization program which includes automated exposure control, adjustment of the mA and/or kV according to patient size and/or use of iterative reconstruction technique. CONTRAST:  79mL OMNIPAQUE IOHEXOL 350 MG/ML SOLN COMPARISON:  CT abdomen pelvis dated 08/30/2021. FINDINGS: CTA CHEST FINDINGS Cardiovascular: There is no cardiomegaly. Small pericardial effusion measuring 7 mm in thickness. Lose three-vessel coronary vascular calcification. Moderate atherosclerotic calcification of the thoracic aorta. No aneurysmal dilatation or dissection. No pulmonary artery embolus identified. Mediastinum/Nodes: No hilar or mediastinal adenopathy. The esophagus is grossly unremarkable. There is a 1 cm right thyroid hypodense nodule. Not clinically significant; no follow-up imaging recommended (ref: J Am Coll Radiol. 2015 Feb;12(2): 143-50).No mediastinal fluid collection. Lungs/Pleura: Small left pleural effusion. There is left lung base atelectasis/scarring. Pneumonia is not excluded. A 3.5 x 1.5 cm ovoid density in the lingula, likely scarring or rounded atelectasis. This was present on the prior chest CT of 2022. There is a 3 mm stable right middle lobe nodule. No pneumothorax. The central airways are patent. Musculoskeletal: Osteopenia with degenerative changes of the spine. Old left rib fractures. No acute osseous pathology.  Review of the MIP images confirms the above findings. CTA ABDOMEN AND PELVIS FINDINGS VASCULAR Aorta: Advanced atherosclerotic calcification the abdominal aorta. There is a 2.8 cm infrarenal aortic ectasia. No aneurysmal dilatation or dissection. No periaortic fluid collection. Celiac: Patent without evidence  of aneurysm, dissection, vasculitis or significant stenosis. SMA: Atherosclerotic calcification of the SMA. The SMA remains patent Renals: The renal arteries are patent. IMA: Minimal flow in the IMA. Inflow: There is atherosclerotic calcification of the iliac arteries. 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 The iliac arteries remain patent. Veins: No obvious venous abnormality within the limitations of this arterial phase study. Review of the MIP images confirms the above findings. NON-VASCULAR No intra-abdominal free air or free fluid. Hepatobiliary: The liver is unremarkable. No biliary ductal dilatation. The gallbladder is unremarkable. Pancreas: There is a 2.8 x 1.7 cm hypoenhancing lesion in the distal body and tail of the pancreas which is not characterized this CT but has increased in size since 2023. No significant metabolic activity was seen on the prior PET suggestive of a possible IPMN. Further characterization with MRI, if not previously performed, is advised. No active inflammatory changes. Spleen: Normal in size without focal abnormality. Adrenals/Urinary Tract: The adrenal glands are unremarkable. Mild bilateral renal parenchyma atrophy. There is no hydronephrosis on either side. The visualized ureters and urinary bladder appear unremarkable. Stomach/Bowel: There is sigmoid diverticulosis without active inflammatory changes. Moderate stool throughout the colon. No bowel obstruction or active inflammation. The appendix is not visualized with certainty. No inflammatory changes identified in the right lower quadrant. Lymphatic: No adenopathy.  Reproductive: Mildly enlarged prostate gland with median lobe hypertrophy. Other: None Musculoskeletal: Osteopenia with degenerative changes of the spine. No acute osseous pathology. Review of the MIP images confirms the above findings. IMPRESSION: 1. No aortic dissection or aneurysm. 2. Small left pleural effusion with left lung base atelectasis/scarring. Pneumonia is not excluded. 3. Sigmoid diverticulosis without active inflammatory changes. No bowel obstruction. 4. Interval increase in size of the hypoenhancing lesion in the distal body and tail of the pancreas. Further characterization with MRI, if not previously performed, is advised. Electronically Signed   By: Anner Crete M.D.   On: 04/27/2022 20:06   DG Chest Port 1 View  Result Date: 04/27/2022 CLINICAL DATA:  Chest pain EXAM: PORTABLE CHEST 1 VIEW COMPARISON:  03/13/2022 x-ray and older FINDINGS: Small left effusion versus pleural thickening with some nodularity and opacity, similar to previous. No pneumothorax or edema. Enlarged cardiopericardial silhouette. Calcified aorta. Overlapping cardiac leads. Degenerative changes seen of the spine and shoulders. IMPRESSION: Enlarged heart. Persistent left effusion, adjacent opacity and nodularity. Appearance is similar to previous. Please correlate with previous workup. Electronically Signed   By: Jill Side M.D.   On: 04/27/2022 18:49     Subjective: No acute issues or events overnight   Discharge Exam: Vitals:   05/06/22 0800 05/06/22 1149  BP: 135/68 (!) 118/57  Pulse: 71 72  Resp: 20 17  Temp: 99.1 F (37.3 C) 98.7 F (37.1 C)  SpO2: 94% 94%   Vitals:   05/05/22 2008 05/06/22 0502 05/06/22 0800 05/06/22 1149  BP: 113/60 117/61 135/68 (!) 118/57  Pulse: 72 71 71 72  Resp: 18 18 20 17   Temp: 98.5 F (36.9 C) 98.1 F (36.7 C) 99.1 F (37.3 C) 98.7 F (37.1 C)  TempSrc: Oral Oral Oral Oral  SpO2: 94% 94% 94% 94%  Weight:      Height:        General: Pt is alert, awake,  not in acute distress Cardiovascular: RRR, S1/S2 +, no rubs, no gallops Respiratory: CTA bilaterally, no wheezing, no rhonchi Abdominal: Soft, NT, ND, bowel sounds + Extremities: no edema,  no cyanosis    The results of significant diagnostics from this hospitalization (including imaging, microbiology, ancillary and laboratory) are listed below for reference.     Microbiology: No results found for this or any previous visit (from the past 240 hour(s)).   Labs: BNP (last 3 results) Recent Labs    04/02/22 1145 04/20/22 1211 04/27/22 1824  BNP 146* 159* [id number]*   Basic Metabolic Panel: Recent Labs  Lab 04/30/22 0204 05/01/22 0526 05/02/22 0228 05/05/22 0025 05/05/22 1132  NA 134* 138 136 137  --   K 4.5 4.1 4.5 4.5  --   CL 103 105 104 101  --   CO2 22 20* 24 24  --   GLUCOSE 95 99 100* 108*  --   BUN 55* 50* 51* 61*  --   CREATININE 2.34* 2.14* 2.16* 2.60* 2.24*  CALCIUM 8.2* 8.6* 8.4* 9.6  --    Liver Function Tests: No results for input(s): "AST", "ALT", "ALKPHOS", "BILITOT", "PROT", "ALBUMIN" in the last 168 hours. No results for input(s): "LIPASE", "AMYLASE" in the last 168 hours. No results for input(s): "AMMONIA" in the last 168 hours. CBC: Recent Labs  Lab 05/05/22 0025 05/05/22 1132  WBC 7.5 8.8  HGB 12.1* 11.6*  HCT 37.1* 39.0  MCV 95.1 103.7*  PLT 274 217   Cardiac Enzymes: No results for input(s): "CKTOTAL", "CKMB", "CKMBINDEX", "TROPONINI" in the last 168 hours. BNP: Invalid input(s): "POCBNP" CBG: Recent Labs  Lab 05/05/22 1149 05/05/22 1542 05/05/22 2047 05/06/22 0715 05/06/22 1134  GLUCAP 139* 136* 176* 119* 128*   D-Dimer No results for input(s): "DDIMER" in the last 72 hours. Hgb A1c No results for input(s): "HGBA1C" in the last 72 hours. Lipid Profile No results for input(s): "CHOL", "HDL", "LDLCALC", "TRIG", "CHOLHDL", "LDLDIRECT" in the last 72 hours. Thyroid function studies No results for input(s): "TSH", "T4TOTAL",  "T3FREE", "THYROIDAB" in the last 72 hours.  Invalid input(s): "FREET3" Anemia work up No results for input(s): "VITAMINB12", "FOLATE", "FERRITIN", "TIBC", "IRON", "RETICCTPCT" in the last 72 hours. Urinalysis    Component Value Date/Time   COLORURINE COLORLESS (A) 04/27/2022 2152   APPEARANCEUR CLEAR 04/27/2022 2152   LABSPEC 1.017 04/27/2022 2152   PHURINE 5.5 04/27/2022 2152   GLUCOSEU 500 (A) 04/27/2022 2152   HGBUR LARGE (A) 04/27/2022 2152   BILIRUBINUR NEGATIVE 04/27/2022 2152   BILIRUBINUR neg 09/28/2012 0832   KETONESUR NEGATIVE 04/27/2022 2152   PROTEINUR TRACE (A) 04/27/2022 2152   UROBILINOGEN 0.2 09/28/2012 0832   NITRITE NEGATIVE 04/27/2022 2152   LEUKOCYTESUR NEGATIVE 04/27/2022 2152   Sepsis Labs Recent Labs  Lab 05/05/22 0025 05/05/22 1132  WBC 7.5 8.8   Microbiology No results found for this or any previous visit (from the past 240 hour(s)).   Time coordinating discharge: Over 30 minutes  SIGNED:   Little Ishikawa, DO [facility] 05/06/2022, 2:46 PM Pager   If 7PM-7AM, please contact night-coverage [email or web address]

## 2022-05-08 DIAGNOSIS — I4891 Unspecified atrial fibrillation: Secondary | ICD-10-CM

## 2022-05-08 NOTE — Telephone Encounter (Signed)
Actually, looking at this in more detail. He had elevated cardiac enzymes consistent with NSTEMI or demand ischemia when he was in the hospital. ASA was started for that reason. OK to continue for now and I will readdress when he comes in for follow-up. thx

## 2022-05-08 NOTE — Telephone Encounter (Signed)
I would stop the aspirin. It's not an effective way to reduce stroke risk in afib. Thanks

## 2022-05-09 ENCOUNTER — Ambulatory Visit (INDEPENDENT_AMBULATORY_CARE_PROVIDER_SITE_OTHER): Payer: PPO | Admitting: Family Medicine

## 2022-05-09 ENCOUNTER — Telehealth: Payer: Self-pay | Admitting: *Deleted

## 2022-05-09 ENCOUNTER — Encounter: Payer: Self-pay | Admitting: *Deleted

## 2022-05-09 VITALS — BP 120/64 | HR 87 | Temp 98.4°F | Ht 68.0 in | Wt 195.8 lb

## 2022-05-09 DIAGNOSIS — R509 Fever, unspecified: Secondary | ICD-10-CM | POA: Insufficient documentation

## 2022-05-09 DIAGNOSIS — R311 Benign essential microscopic hematuria: Secondary | ICD-10-CM | POA: Diagnosis not present

## 2022-05-09 LAB — URINALYSIS, ROUTINE W REFLEX MICROSCOPIC
Bacteria, UA: NONE SEEN /HPF
Bilirubin Urine: NEGATIVE
Hyaline Cast: NONE SEEN /LPF
Ketones, ur: NEGATIVE
Leukocytes,Ua: NEGATIVE
Nitrite: NEGATIVE
Specific Gravity, Urine: 1.01 (ref 1.001–1.035)
WBC, UA: NONE SEEN /HPF (ref 0–5)
pH: 5.5 (ref 5.0–8.0)

## 2022-05-09 LAB — CBC WITH DIFFERENTIAL/PLATELET
Absolute Monocytes: 598 cells/uL (ref 200–950)
Basophils Absolute: 20 cells/uL (ref 0–200)
Basophils Relative: 0.3 %
Eosinophils Absolute: 197 cells/uL (ref 15–500)
Eosinophils Relative: 2.9 %
HCT: 32.1 % — ABNORMAL LOW (ref 38.5–50.0)
Hemoglobin: 10.6 g/dL — ABNORMAL LOW (ref 13.2–17.1)
Lymphs Abs: 428.4 cells/uL — ABNORMAL LOW (ref 850–3900)
MCH: 30.8 pg (ref 27.0–33.0)
MCHC: 33 g/dL (ref 32.0–36.0)
MCV: 93.3 fL (ref 80.0–100.0)
MPV: 9.1 fL (ref 7.5–12.5)
Monocytes Relative: 8.8 %
Neutro Abs: 5556 cells/uL (ref 1500–7800)
Neutrophils Relative %: 81.7 %
Platelets: 316 10*3/uL (ref 140–400)
RBC: 3.44 10*6/uL — ABNORMAL LOW (ref 4.20–5.80)
RDW: 12.1 % (ref 11.0–15.0)
Total Lymphocyte: 6.3 %
WBC: 6.8 10*3/uL (ref 3.8–10.8)

## 2022-05-09 LAB — MICROSCOPIC MESSAGE

## 2022-05-09 NOTE — Progress Notes (Signed)
Acute Office Visit  Subjective:     Patient ID: [name], male    DOB: Sep 05, 1933, 89 y.o.   MRN: [id number]  Chief Complaint  Patient presents with   Fever    Pt with fever up to 100.8 yesterday.     HPI Patient is in today with his wife for dizziness, pleurisy, confusion, weakness and fever to 100.8. He has had a recent hospitalization for chest pain, was found to be in a-fib and returned for pleurisy, and has been home for 4 days. He is wearing a Zio patch. Denies chest pain, shortness of breath, dysuria, abdominal pain, nausea, vomiting, diarrhea, constipation. Wife does report some stool pattern changes since hospitalization.  CT 04/27/22: 1. No aortic dissection or aneurysm. 2. Small left pleural effusion with left lung base atelectasis/scarring. Pneumonia is not excluded. 3. Sigmoid diverticulosis without active inflammatory changes. No bowel obstruction. 4. Interval increase in size of the hypoenhancing lesion in the distal body and tail of the pancreas. Further characterization with MRI, if not previously performed, is advised.  Review of Systems  All other systems reviewed and are negative.   Past Medical History:  Diagnosis Date   Acute blood loss anemia    Acute upper respiratory infections of unspecified site 11/04/2012   AKI (acute kidney injury)    Alzheimer disease    Benign prostatic hyperplasia    CAD (coronary artery disease)    s/p cypher DES to pLAD 6/08; normal LVF;  ETT-Myoview 2009: no ischemia    Clavicle fracture 08/10/2019   Closed displaced fracture of phalanx of left thumb, sequela 08/10/2019   Coronary atherosclerosis of native coronary artery 03/08/2008   Dizziness 03/08/2020   Eosinophilia    Essential hypertension    Fracture of multiple ribs with pain 08/10/2019   Hematoma 11/04/2012   History of multiple falls    History of SAH (subarachnoid hemorrhage) 07/14/2019   Hyperlipidemia type IIB / III 03/08/2008   Itching of ear  07/14/2021   Major neurocognitive disorder due to possible Alzheimer's disease, without behavioral disturbance 02/29/2020   MI (myocardial infarction)    Moderate persistent asthma    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   Imaging revealed [facility]   Multiple trauma    Nocturnal leg cramps 11/23/2012   Orthostatic hypotension 03/08/2008   Otalgia of left ear 05/11/2021   Otorrhagia of left ear 03/30/2021   Pain in joint, shoulder region 11/23/2012   Stage 3b chronic kidney disease    Thrombocytopenia    Trigeminal neuralgia    Type 2 diabetes mellitus    Past Surgical History:  Procedure Laterality Date   APPENDECTOMY     CARDIAC CATHETERIZATION  07/30/2006   CORONARY ANGIOPLASTY WITH STENT PLACEMENT   CARDIAC CATHETERIZATION     CATARACT EXTRACTION Right 03/2022   CATARACT EXTRACTION Left 04/2022   EXPLORATORY LAPAROTOMY     age 39   EXPLORATORY LAPAROTOMY     IR THORACENTESIS ASP PLEURAL SPACE W/IMG GUIDE  10/05/2019   Current Outpatient Medications on File Prior to Visit  Medication Sig Dispense Refill   albuterol (PROAIR HFA) 108 (90 Base) MCG/ACT inhaler INHALE 2 PUFFS INTO THE LUNGS EVERY 6 HOURS AS NEEDED FOR WHEEZING OR SHORTNESS OF BREATH (Patient taking differently: Inhale 2 puffs into the lungs every 6 (six) hours as needed for wheezing or shortness of breath. INHALE 2 PUFFS INTO THE LUNGS EVERY 6 HOURS AS NEEDED FOR WHEEZING OR SHORTNESS OF BREATH) 6.7  g 0   amiodarone (PACERONE) 200 MG tablet Take 200 mg by mouth 2 (two) times daily.     aspirin EC 81 MG tablet Take 1 tablet (81 mg total) by mouth daily. Swallow whole. 30 tablet 12   atorvastatin (LIPITOR) 20 MG tablet Take 1 tablet (20 mg total) by mouth daily. 90 tablet 3   doxazosin (CARDURA) 2 MG tablet TAKE 1 TABLET BY MOUTH EVERY DAY (Patient taking differently: Take 2 mg by mouth daily.) 90 tablet 1   empagliflozin (JARDIANCE) 10 MG TABS tablet Take 1 tablet (10 mg total) by mouth  daily before breakfast. 30 tablet 5   escitalopram (LEXAPRO) 10 MG tablet TAKE 1 TABLET BY MOUTH EVERY DAY (Patient taking differently: Take 10 mg by mouth daily.) 90 tablet 1   ezetimibe (ZETIA) 10 MG tablet TAKE 1 TABLET BY MOUTH DAILY. (Patient taking differently: Take 10 mg by mouth daily. TAKE 1 TABLET BY MOUTH DAILY.) 90 tablet 3   fluticasone furoate-vilanterol (BREO ELLIPTA) 200-25 MCG/ACT AEPB INHALE 1 PUFF DAILY *NEED APPT* (Patient taking differently: Inhale 1 puff into the lungs daily. INHALE 1 PUFF DAILY *NEED APPT*) 60 each 11   furosemide (LASIX) 40 MG tablet Take 1 tablet (40 mg total) by mouth every other day. 45 tablet 3   loratadine (CLARITIN) 10 MG tablet Take 10 mg by mouth daily as needed for allergies or rhinitis.      meclizine (ANTIVERT) 12.5 MG tablet Take 1 tablet (12.5 mg total) by mouth 3 (three) times daily as needed for dizziness. 15 tablet 0   memantine (NAMENDA) 5 MG tablet TAKE 1 TABLET BY MOUTH EVERYDAY AT BEDTIME (Patient taking differently: Take 5 mg by mouth at bedtime.) 30 tablet 0   Multiple Vitamin (MULTIVITAMIN WITH MINERALS) TABS tablet Take 1 tablet by mouth daily.     No current facility-administered medications on file prior to visit.   Allergies  Allergen Reactions   Gabapentin Other (See Comments)    Visual changes and confusion- "I went blind while I was driving"        Objective:    BP 120/64   Pulse 87   Temp 98.4 F (36.9 C)   Ht 5\' 8"  (1.727 m)   Wt 195 lb 12.8 oz (88.8 kg)   SpO2 97%   BMI 29.77 kg/m    Physical Exam Vitals and nursing note reviewed.  Constitutional:      Appearance: Normal appearance. He is normal weight.  HENT:     Head: Normocephalic and atraumatic.  Cardiovascular:     Rate and Rhythm: Normal rate and regular rhythm.     Pulses: Normal pulses.     Heart sounds: Normal heart sounds.  Pulmonary:     Effort: Pulmonary effort is normal.     Breath sounds: Normal breath sounds.  Abdominal:      General: Bowel sounds are normal.     Palpations: Abdomen is soft.     Tenderness: There is abdominal tenderness in the left lower quadrant.  Skin:    General: Skin is warm and dry.     Capillary Refill: Capillary refill takes less than 2 seconds.  Neurological:     General: No focal deficit present.     Mental Status: He is alert and oriented to person, place, and time. Mental status is at baseline.  Psychiatric:        Mood and Affect: Mood normal.        Behavior: Behavior normal.  Thought Content: Thought content normal.        Judgment: Judgment normal.     Results for orders placed or performed in visit on 05/09/22  Urinalysis, Routine w reflex microscopic  Result Value Ref Range   Color, Urine YELLOW YELLOW   APPearance CLEAR CLEAR   Specific Gravity, Urine 1.010 1.001 - 1.035   pH 5.5 5.0 - 8.0   Glucose, UA 2+ (A) NEGATIVE   Bilirubin Urine NEGATIVE NEGATIVE   Ketones, ur NEGATIVE NEGATIVE   Hgb urine dipstick 1+ (A) NEGATIVE   Protein, ur 2+ (A) NEGATIVE   Nitrite NEGATIVE NEGATIVE   Leukocytes,Ua NEGATIVE NEGATIVE   WBC, UA NONE SEEN 0 - 5 /HPF   RBC / HPF 0-2 0 - 2 /HPF   Squamous Epithelial / HPF 0-5 < OR = 5 /HPF   Bacteria, UA NONE SEEN NONE SEEN /HPF   Hyaline Cast NONE SEEN NONE SEEN /LPF  Microscopic Message  Result Value Ref Range   Note          Assessment & Plan:   Problem List Items Addressed This Visit       Other   Fever - Primary    Afebrile if office today. UA negative. Lungs clear on auscultation, he does have some mild LLQ abdominal pain with palpation, history of diverticulosis on colonoscopy, CBC drawn today, CT 3/22 showed no inflammatory changes or bowel obstruction. No signs of viral illness. He does have a hospital follow-up tomorrow with PCP. Seek sooner medical care if he becomes acutely worse.      Relevant Orders   CBC with Differential/Platelet   Microscopic Message (Completed)    No orders of the defined types  were placed in this encounter.   Return if symptoms worsen or fail to improve, for keep hospital follow up appointment with PCP.  Rubie Maid, FNP

## 2022-05-09 NOTE — Transitions of Care (Post Inpatient/ED Visit) (Signed)
05/09/2022  Name: [name] MRN: [id number] DOB: Feb 05, 1934  Today's TOC FU Call Status: Today's TOC FU Call Status:: Successful TOC FU Call Competed TOC FU Call Complete Date: 05/09/22  Transition Care Management Follow-up Telephone Call Date of Discharge: 05/08/22 Discharge Facility: [facility]) Type of Discharge: Inpatient Admission Primary Inpatient Discharge Diagnosis:: chest pain/ probable pleuritic chest pain How have you been since you were released from the hospital?: Same ("Thank you for calling me back; he was admitted again for observation because he developed chest pain which they said was due to pleurisy; he is doing okay, but developed a fever overnight last night, the NP saw him today, Dr. Dennard Schaumann sees him tomorrow") Any questions or concerns?: Yes Patient Questions/Concerns:: recurring chest discomfort with hospital re-admission for OBS status; post- discharge 05/09/22 development of fever with decreased appetite-- spouse contacted me this morning and left voice message requesting call back; subsequently scheduled urgent office visit with NP at PCP office Patient Questions/Concerns Addressed: Other: (contacted spouse back as requested and completed TOC call; reviewed office visit with NP and scheduled with RN CM care Coordinator for phone call visit next week)  Items Reviewed: Did you receive and understand the discharge instructions provided?: Yes (thoroughly reviewed with patient's spouse who verbalizes excellent understanding of same) Medications obtained and verified?: Yes (Medications Reviewed) (Full medication review completed at time of 05/04/22 TOC call; no concerns or discrepancies identified; reviewed new instructions for Amiodarone post 05/08/22 hosp DC; spouse manages medications and denies questions/ concerns around medications today) Any new allergies since your discharge?: No Dietary orders reviewed?: Yes Type of Diet Ordered:: Heart Healthy low  salt Do you have support at home?: Yes People in Home: spouse Name of Support/Comfort Primary Source: spouse/ caregiver continues to assist with all care needs/ activities; at baseline patient able to independently perform many self-care activities with spouse's ongoing supervision  Home Care and Equipment/Supplies: [place] Ordered?: NA (confirmed home health PT remains active as per TOC call 05/04/22) Name of [place]:: [facility] Has Agency set up a time to come to your home?: Yes [place] Visit Date: 05/04/22 (post- prior hospital discharge) Any new equipment or medical supplies ordered?: No (No new equipment-- however, spouse did mention that the cardiac monitor arrived in the mail from March hospitalization- patient has been wearing since 05/08/22)  Functional Questionnaire: Do you need assistance with bathing/showering or dressing?: Yes (wife assists with all aspects of care post-recent hospital visits) Do you need assistance with meal preparation?: Yes (wife assists with all aspects of care post-recent hospital visits) Do you need assistance with eating?: No Do you have difficulty maintaining continence: Yes (wife assists with all aspects of care post-recent hospital visits) Do you need assistance with getting out of bed/getting out of a chair/moving?: Yes (wife assists with all aspects of care post-recent hospital visits) Do you have difficulty managing or taking your medications?: Yes (wife assists with all aspects of care post-recent hospital visits)  Follow up appointments reviewed: PCP Follow-up appointment confirmed?: Yes Date of PCP follow-up appointment?: 05/10/22 Follow-up Provider: PCP [facility] Follow-up appointment confirmed?: Yes Date of Specialist follow-up appointment?: 05/24/22 Follow-Up Specialty Provider:: cardiology provider- post outpatient cardiac monitoring/ March hospitalization for AF Do you need transportation to your  follow-up appointment?: No Do you understand care options if your condition(s) worsen?: Yes-patient verbalized understanding  SDOH Interventions Today    Flowsheet Row Most Recent Value  SDOH Interventions   Food Insecurity Interventions  Intervention Not Indicated  Transportation Interventions Intervention Not Indicated  [spouse provides transportation]      TOC Interventions Today    Flowsheet Row Most Recent Value  TOC Interventions   TOC Interventions Discussed/Reviewed TOC Interventions Discussed  [provided my direct contact information should questions/ concerns/ needs arise post-TOC call, prior to RN CM telephone visit]      Interventions Today    Flowsheet Row Most Recent Value  Chronic Disease   Chronic disease during today's visit Other  [pleuritic chest pain, post- recent hospitalization in March for A-Fib]  General Interventions   General Interventions Discussed/Reviewed General Interventions Discussed, Doctor Visits, Referral to Nurse, Communication with  Doctor Visits Discussed/Reviewed Doctor Visits Discussed, Doctor Visits Reviewed, PCP, Specialist  PCP/Specialist Visits Compliance with follow-up visit  Communication with RN  Exercise Interventions   Exercise Discussed/Reviewed Exercise Discussed  [confirmed home health PT services remain active]  Nutrition Interventions   Nutrition Discussed/Reviewed Nutrition Discussed  Pharmacy Interventions   Pharmacy Dicussed/Reviewed Pharmacy Topics Discussed      Oneta Rack, RN, BSN, CCRN Alumnus RN CM Care Coordination/ Transition of [place] [facility] [phone]: direct office

## 2022-05-09 NOTE — Assessment & Plan Note (Addendum)
Afebrile if office today. UA negative. Lungs clear on auscultation, he does have some mild LLQ abdominal pain with palpation, history of diverticulosis on colonoscopy, CBC drawn today, CT 3/22 showed no inflammatory changes or bowel obstruction. No signs of viral illness. He does have a hospital follow-up tomorrow with PCP. Seek sooner medical care if he becomes acutely worse.

## 2022-05-10 ENCOUNTER — Encounter: Payer: Self-pay | Admitting: Family Medicine

## 2022-05-10 ENCOUNTER — Ambulatory Visit (INDEPENDENT_AMBULATORY_CARE_PROVIDER_SITE_OTHER): Payer: PPO | Admitting: Family Medicine

## 2022-05-10 VITALS — BP 122/64 | HR 77 | Temp 97.5°F | Ht 68.0 in | Wt 195.0 lb

## 2022-05-10 DIAGNOSIS — D696 Thrombocytopenia, unspecified: Secondary | ICD-10-CM | POA: Diagnosis not present

## 2022-05-10 DIAGNOSIS — K5730 Diverticulosis of large intestine without perforation or abscess without bleeding: Secondary | ICD-10-CM | POA: Diagnosis not present

## 2022-05-10 DIAGNOSIS — I2510 Atherosclerotic heart disease of native coronary artery without angina pectoris: Secondary | ICD-10-CM | POA: Diagnosis not present

## 2022-05-10 DIAGNOSIS — K219 Gastro-esophageal reflux disease without esophagitis: Secondary | ICD-10-CM | POA: Diagnosis not present

## 2022-05-10 DIAGNOSIS — J90 Pleural effusion, not elsewhere classified: Secondary | ICD-10-CM

## 2022-05-10 DIAGNOSIS — I252 Old myocardial infarction: Secondary | ICD-10-CM | POA: Diagnosis not present

## 2022-05-10 DIAGNOSIS — F32A Depression, unspecified: Secondary | ICD-10-CM | POA: Diagnosis not present

## 2022-05-10 DIAGNOSIS — G309 Alzheimer's disease, unspecified: Secondary | ICD-10-CM | POA: Diagnosis not present

## 2022-05-10 DIAGNOSIS — N400 Enlarged prostate without lower urinary tract symptoms: Secondary | ICD-10-CM | POA: Diagnosis not present

## 2022-05-10 DIAGNOSIS — R509 Fever, unspecified: Secondary | ICD-10-CM

## 2022-05-10 DIAGNOSIS — I5033 Acute on chronic diastolic (congestive) heart failure: Secondary | ICD-10-CM | POA: Diagnosis not present

## 2022-05-10 DIAGNOSIS — J4540 Moderate persistent asthma, uncomplicated: Secondary | ICD-10-CM | POA: Diagnosis not present

## 2022-05-10 DIAGNOSIS — R091 Pleurisy: Secondary | ICD-10-CM

## 2022-05-10 DIAGNOSIS — E1122 Type 2 diabetes mellitus with diabetic chronic kidney disease: Secondary | ICD-10-CM | POA: Diagnosis not present

## 2022-05-10 DIAGNOSIS — Z6832 Body mass index (BMI) 32.0-32.9, adult: Secondary | ICD-10-CM | POA: Diagnosis not present

## 2022-05-10 DIAGNOSIS — E782 Mixed hyperlipidemia: Secondary | ICD-10-CM | POA: Diagnosis not present

## 2022-05-10 DIAGNOSIS — I4891 Unspecified atrial fibrillation: Secondary | ICD-10-CM | POA: Diagnosis not present

## 2022-05-10 DIAGNOSIS — N1832 Chronic kidney disease, stage 3b: Secondary | ICD-10-CM | POA: Diagnosis not present

## 2022-05-10 DIAGNOSIS — D631 Anemia in chronic kidney disease: Secondary | ICD-10-CM | POA: Diagnosis not present

## 2022-05-10 DIAGNOSIS — I130 Hypertensive heart and chronic kidney disease with heart failure and stage 1 through stage 4 chronic kidney disease, or unspecified chronic kidney disease: Secondary | ICD-10-CM | POA: Diagnosis not present

## 2022-05-10 DIAGNOSIS — I951 Orthostatic hypotension: Secondary | ICD-10-CM | POA: Diagnosis not present

## 2022-05-10 DIAGNOSIS — G500 Trigeminal neuralgia: Secondary | ICD-10-CM | POA: Diagnosis not present

## 2022-05-10 DIAGNOSIS — F0283 Dementia in other diseases classified elsewhere, unspecified severity, with mood disturbance: Secondary | ICD-10-CM | POA: Diagnosis not present

## 2022-05-10 DIAGNOSIS — F0284 Dementia in other diseases classified elsewhere, unspecified severity, with anxiety: Secondary | ICD-10-CM | POA: Diagnosis not present

## 2022-05-10 DIAGNOSIS — I083 Combined rheumatic disorders of mitral, aortic and tricuspid valves: Secondary | ICD-10-CM | POA: Diagnosis not present

## 2022-05-10 DIAGNOSIS — J4489 Other specified chronic obstructive pulmonary disease: Secondary | ICD-10-CM | POA: Diagnosis not present

## 2022-05-10 DIAGNOSIS — K8689 Other specified diseases of pancreas: Secondary | ICD-10-CM

## 2022-05-10 DIAGNOSIS — E669 Obesity, unspecified: Secondary | ICD-10-CM | POA: Diagnosis not present

## 2022-05-10 MED ORDER — AMOXICILLIN-POT CLAVULANATE 875-125 MG PO TABS: 1.0000 | ORAL_TABLET | Freq: Two times a day (BID) | ORAL | 0 refills | Status: DC

## 2022-05-10 NOTE — Progress Notes (Signed)
1 

## 2022-05-10 NOTE — Progress Notes (Signed)
Subjective:    Patient ID: [name], male    DOB: 10/25/1933, 89 y.o.   MRN: [id number]    Patient has been admitted to the hospital twice recently.  The first time was then late March.  At that time he was having central chest pain radiating in between his shoulder blades.  He was found to be in atrial fibrillation with rapid ventricular response.  Patient had a CT scan of his chest at that time which revealed a density in the left lingula thought to be atelectasis and a chronic left-sided pleural effusion.  Patient was discharged home and then developed pain radiating into his left arm as well as chest pain.  He went back to the hospital at the end of March.  His x-ray was remarkable except for the pleural effusion and left-sided atelectasis.  Patient was diagnosed with pleurisy and was discharged home.  He continues to have pleuritic pain with the majority the pain being in his left posterior ribs with deep inspiration.  He is also started having night sweats.  His wife reports dyspnea on exertion.  He has had a fever over 100 for the last 2 days although he is afebrile here.  He is somewhat hard yesterday and a urinalysis was unremarkable.  Today on examination, he has prominent left-sided crackles in the lower and mid left lung fields.  Right side of his lungs clear.  He is speaking in full sentences.  He is not short of breath at rest.  He nontoxic-appearing.  The rest of his exam is normal aside from stage 1 pressure sore over his left ischial spine.  Of note, there was an enlarging lesion in his pancreas felt to be an IPMN but an MRI was recommended for further assessment. Past Medical History:  Diagnosis Date   Acute blood loss anemia    Acute upper respiratory infections of unspecified site 11/04/2012   AKI (acute kidney injury)    Alzheimer disease    Benign prostatic hyperplasia    CAD (coronary artery disease)    s/p cypher DES to pLAD 6/08; normal LVF;  ETT-Myoview 2009: no  ischemia    Clavicle fracture 08/10/2019   Closed displaced fracture of phalanx of left thumb, sequela 08/10/2019   Coronary atherosclerosis of native coronary artery 03/08/2008   Dizziness 03/08/2020   Eosinophilia    Essential hypertension    Fracture of multiple ribs with pain 08/10/2019   Hematoma 11/04/2012   History of multiple falls    History of SAH (subarachnoid hemorrhage) 07/14/2019   Hyperlipidemia type IIB / III 03/08/2008   Itching of ear 07/14/2021   Major neurocognitive disorder due to possible Alzheimer's disease, without behavioral disturbance 02/29/2020   MI (myocardial infarction)    Moderate persistent asthma    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   Imaging revealed [facility]   Multiple trauma    Nocturnal leg cramps 11/23/2012   Orthostatic hypotension 03/08/2008   Otalgia of left ear 05/11/2021   Otorrhagia of left ear 03/30/2021   Pain in joint, shoulder region 11/23/2012   Stage 3b chronic kidney disease    Thrombocytopenia    Trigeminal neuralgia    Type 2 diabetes mellitus    Past Surgical History:  Procedure Laterality Date   APPENDECTOMY     CARDIAC CATHETERIZATION  07/30/2006   CORONARY ANGIOPLASTY WITH STENT PLACEMENT   CARDIAC CATHETERIZATION     CATARACT EXTRACTION Right 03/2022   CATARACT EXTRACTION Left  04/2022   EXPLORATORY LAPAROTOMY     age 66   EXPLORATORY LAPAROTOMY     IR THORACENTESIS ASP PLEURAL SPACE W/IMG GUIDE  10/05/2019   Current Outpatient Medications on File Prior to Visit  Medication Sig Dispense Refill   albuterol (PROAIR HFA) 108 (90 Base) MCG/ACT inhaler INHALE 2 PUFFS INTO THE LUNGS EVERY 6 HOURS AS NEEDED FOR WHEEZING OR SHORTNESS OF BREATH (Patient taking differently: Inhale 2 puffs into the lungs every 6 (six) hours as needed for wheezing or shortness of breath. INHALE 2 PUFFS INTO THE LUNGS EVERY 6 HOURS AS NEEDED FOR WHEEZING OR SHORTNESS OF BREATH) 6.7 g 0   amiodarone (PACERONE) 200  MG tablet Take 200 mg by mouth 2 (two) times daily.     aspirin EC 81 MG tablet Take 1 tablet (81 mg total) by mouth daily. Swallow whole. 30 tablet 12   atorvastatin (LIPITOR) 20 MG tablet Take 1 tablet (20 mg total) by mouth daily. 90 tablet 3   doxazosin (CARDURA) 2 MG tablet TAKE 1 TABLET BY MOUTH EVERY DAY (Patient taking differently: Take 2 mg by mouth daily.) 90 tablet 1   empagliflozin (JARDIANCE) 10 MG TABS tablet Take 1 tablet (10 mg total) by mouth daily before breakfast. 30 tablet 5   escitalopram (LEXAPRO) 10 MG tablet TAKE 1 TABLET BY MOUTH EVERY DAY (Patient taking differently: Take 10 mg by mouth daily.) 90 tablet 1   ezetimibe (ZETIA) 10 MG tablet TAKE 1 TABLET BY MOUTH DAILY. (Patient taking differently: Take 10 mg by mouth daily. TAKE 1 TABLET BY MOUTH DAILY.) 90 tablet 3   fluticasone furoate-vilanterol (BREO ELLIPTA) 200-25 MCG/ACT AEPB INHALE 1 PUFF DAILY *NEED APPT* (Patient taking differently: Inhale 1 puff into the lungs daily. INHALE 1 PUFF DAILY *NEED APPT*) 60 each 11   furosemide (LASIX) 40 MG tablet Take 1 tablet (40 mg total) by mouth every other day. 45 tablet 3   loratadine (CLARITIN) 10 MG tablet Take 10 mg by mouth daily as needed for allergies or rhinitis.      meclizine (ANTIVERT) 12.5 MG tablet Take 1 tablet (12.5 mg total) by mouth 3 (three) times daily as needed for dizziness. 15 tablet 0   memantine (NAMENDA) 5 MG tablet TAKE 1 TABLET BY MOUTH EVERYDAY AT BEDTIME (Patient taking differently: Take 5 mg by mouth at bedtime.) 30 tablet 0   Multiple Vitamin (MULTIVITAMIN WITH MINERALS) TABS tablet Take 1 tablet by mouth daily.     No current facility-administered medications on file prior to visit.   Allergies  Allergen Reactions   Gabapentin Other (See Comments)    Visual changes and confusion- "I went blind while I was driving"   Social History   Socioeconomic History   Marital status: Married    Spouse name: [name]   Number of children: 1   Years of  education: 20   Highest education level: Occupational hygienist History   Occupation: retired professor    Comment: History    Occupation: missionary  Tobacco Use   Smoking status: Never   Smokeless tobacco: Never  Scientific laboratory technician Use: Never used  Substance and Sexual Activity   Alcohol use: Not Currently   Drug use: Never   Sexual activity: Yes    Partners: Female  Other Topics Concern   Not on file  Social History Narrative   ** Merged History Encounter **       Lives with his wife (second marriage in 2015, first marriage  ended when his wife died alzheimer's disease). Since his remarriage, his adult daughter doesn't speak with him.      Right handed   Social Determinants of Health   Financial Resource Strain: Low Risk  (02/09/2022)   Overall Financial Resource Strain (CARDIA)    Difficulty of Paying Living Expenses: Not hard at all  Food Insecurity: No Food Insecurity (05/09/2022)   Hunger Vital Sign    Worried About Running Out of Food in the Last Year: Never true    Ran Out of Food in the Last Year: Never true  Transportation Needs: No Transportation Needs (05/09/2022)   PRAPARE - Hydrologist (Medical): No    Lack of Transportation (Non-Medical): No  Physical Activity: Not on file  Stress: Not on file  Social Connections: Not on file  Intimate Partner Violence: Not At Risk (05/05/2022)   Humiliation, Afraid, Rape, and Kick questionnaire    Fear of Current or Ex-Partner: No    Emotionally Abused: No    Physically Abused: No    Sexually Abused: No   Family History  Problem Relation Age of Onset   Stomach cancer Mother    Memory loss Mother    Heart disease Father    Coronary artery disease Father 79   Heart attack Father    Heart disease Brother    Aortic aneurysm Brother    Colon cancer Brother    Aortic aneurysm Brother    Colon cancer Brother    Arthritis Daughter        rheumatoid   Coronary artery disease Brother     Esophageal cancer Neg Hx    Rectal cancer Neg Hx      Review of Systems  Respiratory:  Positive for cough.   All other systems reviewed and are negative.      Objective:   Physical Exam Vitals reviewed.  Constitutional:      General: He is not in acute distress.    Appearance: He is well-developed. He is not diaphoretic.  Neck:     Thyroid: No thyromegaly.     Vascular: No carotid bruit or JVD.     Trachea: No tracheal deviation.  Cardiovascular:     Rate and Rhythm: Normal rate and regular rhythm.     Heart sounds: Normal heart sounds. No murmur heard.    No friction rub. No gallop.  Pulmonary:     Effort: Pulmonary effort is normal. No accessory muscle usage, prolonged expiration or respiratory distress.     Breath sounds: No stridor. Examination of the left-middle field reveals rhonchi and rales. Examination of the left-lower field reveals rhonchi and rales. Rhonchi and rales present. No wheezing.    Abdominal:     General: Bowel sounds are normal.     Palpations: Abdomen is soft.  Musculoskeletal:     Cervical back: Neck supple. No erythema, rigidity, torticollis or crepitus. No pain with movement, spinous process tenderness or muscular tenderness. Normal range of motion.     Right lower leg: No edema.     Left lower leg: No edema.  Lymphadenopathy:     Cervical: No cervical adenopathy.  Neurological:     Mental Status: He is alert.     Motor: No abnormal muscle tone.     Deep Tendon Reflexes: Reflexes are normal and symmetric.           Assessment & Plan:  Fever, unspecified fever cause - Plan: CBC with Differential/Platelet, D-dimer, quantitative, COMPLETE  METABOLIC PANEL WITH GFR  Pleurisy  Recurrent left pleural effusion  Pancreatic mass  Patient's physical exam is marked abnormal from the left, send baseline, rales heard in his left lung.  Although he has a left-sided pleural effusion and atelectasis I am concerned that he may be developing  pneumonia that has been otherwise obscured by these findings.  The other possibility for pleurisy would be pulmonary embolism.  I will check a CBC a D-dimer and CMP.  Start the patient empirically on Augmentin.  If D-dimer is elevated repeat CT scan of the chest to evaluate for PE.  If workup is negative and antibiotics do not help with night sweats, proceed with an MRI of the abdomen to evaluate pancreas further.  Recheck on Monday

## 2022-05-11 ENCOUNTER — Other Ambulatory Visit: Payer: Self-pay | Admitting: Family Medicine

## 2022-05-11 ENCOUNTER — Telehealth: Payer: Self-pay | Admitting: *Deleted

## 2022-05-11 ENCOUNTER — Other Ambulatory Visit: Payer: Self-pay | Admitting: Physician Assistant

## 2022-05-11 DIAGNOSIS — F0390 Unspecified dementia without behavioral disturbance: Secondary | ICD-10-CM

## 2022-05-11 DIAGNOSIS — R7989 Other specified abnormal findings of blood chemistry: Secondary | ICD-10-CM

## 2022-05-11 DIAGNOSIS — R0602 Shortness of breath: Secondary | ICD-10-CM

## 2022-05-11 DIAGNOSIS — R091 Pleurisy: Secondary | ICD-10-CM

## 2022-05-11 LAB — COMPLETE METABOLIC PANEL WITH GFR
AG Ratio: 1.4 (calc) (ref 1.0–2.5)
ALT: 42 U/L (ref 9–46)
AST: 41 U/L — ABNORMAL HIGH (ref 10–35)
Albumin: 3.8 g/dL (ref 3.6–5.1)
Alkaline phosphatase (APISO): 153 U/L — ABNORMAL HIGH (ref 35–144)
BUN/Creatinine Ratio: 21 (calc) (ref 6–22)
BUN: 52 mg/dL — ABNORMAL HIGH (ref 7–25)
CO2: 25 mmol/L (ref 20–32)
Calcium: 8.8 mg/dL (ref 8.6–10.3)
Chloride: 100 mmol/L (ref 98–110)
Creat: 2.49 mg/dL — ABNORMAL HIGH (ref 0.70–1.22)
Globulin: 2.8 g/dL (calc) (ref 1.9–3.7)
Glucose, Bld: 147 mg/dL — ABNORMAL HIGH (ref 65–99)
Potassium: 4.7 mmol/L (ref 3.5–5.3)
Sodium: 136 mmol/L (ref 135–146)
Total Bilirubin: 0.5 mg/dL (ref 0.2–1.2)
Total Protein: 6.6 g/dL (ref 6.1–8.1)
eGFR: 25 mL/min/{1.73_m2} — ABNORMAL LOW (ref >=60)

## 2022-05-11 LAB — CBC WITH DIFFERENTIAL/PLATELET
Absolute Monocytes: 589 cells/uL (ref 200–950)
Basophils Absolute: 7 cells/uL (ref 0–200)
Basophils Relative: 0.1 %
Eosinophils Absolute: 107 cells/uL (ref 15–500)
Eosinophils Relative: 1.5 %
HCT: 32.2 % — ABNORMAL LOW (ref 38.5–50.0)
Hemoglobin: 10.7 g/dL — ABNORMAL LOW (ref 13.2–17.1)
Lymphs Abs: 482.8 cells/uL — ABNORMAL LOW (ref 850–3900)
MCH: 30.4 pg (ref 27.0–33.0)
MCHC: 33.2 g/dL (ref 32.0–36.0)
MCV: 91.5 fL (ref 80.0–100.0)
MPV: 9.2 fL (ref 7.5–12.5)
Monocytes Relative: 8.3 %
Neutro Abs: 5914 cells/uL (ref 1500–7800)
Neutrophils Relative %: 83.3 %
Platelets: 347 10*3/uL (ref 140–400)
RBC: 3.52 10*6/uL — ABNORMAL LOW (ref 4.20–5.80)
RDW: 12 % (ref 11.0–15.0)
Total Lymphocyte: 6.8 %
WBC: 7.1 10*3/uL (ref 3.8–10.8)

## 2022-05-11 LAB — D-DIMER, QUANTITATIVE: D-Dimer, Quant: 5.52 mcg/mL FEU — ABNORMAL HIGH (ref <0.50)

## 2022-05-11 NOTE — Patient Outreach (Signed)
  Care Coordination   Post-TOC Care Coordination  Visit Note   05/11/2022 Name: CLENDON LINDEMANN MRN: [id number] DOB: Sep 11, 1933  [name] is a 89 y.o. year old male who sees Pickard, Priscille Heidelberg, MD for primary care. I spoke with caregiver/ spouse Clydie Braun, verified on [facility] DPR re:  [name] by phone today.  What matters to the patients health and wellness today?  "Thank you for calling me back so quickly; I know you had [place] scheduled to speak with the RN on 05/16/22-- but we saw Dr. Tanya Nones yesterday and he told us to call the pulmonary team to schedule asap-- they have availability to see him on 4/10, but said they could not cancel his phone call appointment with the nurse; can we re-schedule that now?  Also, Dr. Tanya Nones did put him on an antibiotic yesterday when we saw him; so he is taking that now.  Dr. Tanya Nones also said there is possibly a small bedsore devloping, so I am watching that closely.  Thank you for your help"   SDOH assessments and interventions completed:  Yes Previously completed during TOC and care coordination outreaches- verified no changes today   Care Coordination Interventions:  Yes, provided   Interventions Today    Flowsheet Row Most Recent Value  Chronic Disease   Chronic disease during today's visit Other  [2 recent hhospitalizations for pneumonia/ chest pain]  General Interventions   General Interventions Discussed/Reviewed General Interventions Discussed, Doctor Visits  Doctor Visits Discussed/Reviewed Doctor Visits Discussed, Doctor Visits Reviewed, PCP, Specialist  Communication with RN  [to inform of re-scheduled telephone visit to 05/21/22]  Pharmacy Interventions   Pharmacy Dicussed/Reviewed Pharmacy Topics Discussed      Follow up plan: Follow up call scheduled for rescheduled RN CM phone visit from 05/16/22 to 05/21/22    Encounter Outcome:  Pt. Visit Completed   Caryl Pina, RN, BSN, CCRN Alumnus RN CM Care Coordination/  Transition of Care- [facility] Care Management [phone]: direct office

## 2022-05-14 ENCOUNTER — Encounter: Payer: Self-pay | Admitting: Family Medicine

## 2022-05-14 ENCOUNTER — Ambulatory Visit (INDEPENDENT_AMBULATORY_CARE_PROVIDER_SITE_OTHER): Payer: PPO | Admitting: Family Medicine

## 2022-05-14 VITALS — BP 116/56 | HR 78 | Temp 97.8°F | Ht 69.0 in | Wt 195.6 lb

## 2022-05-14 DIAGNOSIS — R509 Fever, unspecified: Secondary | ICD-10-CM | POA: Diagnosis not present

## 2022-05-14 DIAGNOSIS — R091 Pleurisy: Secondary | ICD-10-CM | POA: Diagnosis not present

## 2022-05-14 DIAGNOSIS — R7989 Other specified abnormal findings of blood chemistry: Secondary | ICD-10-CM | POA: Diagnosis not present

## 2022-05-14 NOTE — Progress Notes (Signed)
Subjective:    Patient ID: [name], male    DOB: Aug 20, 1933, 89 y.o.   MRN: [id number]   05/10/22 Patient has been admitted to the hospital twice recently.  The first time was then late March.  At that time he was having central chest pain radiating in between his shoulder blades.  He was found to be in atrial fibrillation with rapid ventricular response.  Patient had a CT scan of his chest at that time which revealed a density in the left lingula thought to be atelectasis and a chronic left-sided pleural effusion.  Patient was discharged home and then developed pain radiating into his left arm as well as chest pain.  He went back to the hospital at the end of March.  His x-ray was remarkable except for the pleural effusion and left-sided atelectasis.  Patient was diagnosed with pleurisy and was discharged home.  He continues to have pleuritic pain with the majority the pain being in his left posterior ribs with deep inspiration.  He is also started having night sweats.  His wife reports dyspnea on exertion.  He has had a fever over 100 for the last 2 days although he is afebrile here.  He is somewhat hard yesterday and a urinalysis was unremarkable.  Today on examination, he has prominent left-sided crackles in the lower and mid left lung fields.  Right side of his lungs clear.  He is speaking in full sentences.  He is not short of breath at rest.  He nontoxic-appearing.  The rest of his exam is normal aside from stage 1 pressure sore over his left ischial spine.  Of note, there was an enlarging lesion in his pancreas felt to be an IPMN but an MRI was recommended for further assessment.  At that time, my plan was: Patient's physical exam is markedly abnormal with basilar rales heard in his left lung.  Although he has a left-sided pleural effusion and atelectasis I am concerned that he may be developing pneumonia that has been otherwise obscured by these findings.  The other possibility for pleurisy  would be pulmonary embolism.  I will check a CBC a D-dimer and CMP.  Start the patient empirically on Augmentin.  If D-dimer is elevated repeat CT scan of the chest to evaluate for PE.  If workup is negative and antibiotics do not help with night sweats, proceed with an MRI of the abdomen to evaluate pancreas further.  Recheck on Monday  05/14/22 Office Visit on 05/10/2022  Component Date Value Ref Range Status   WBC 05/10/2022 7.1  3.8 - 10.8 Thousand/uL Final   RBC 05/10/2022 3.52 (L)  4.20 - 5.80 Million/uL Final   Hemoglobin 05/10/2022 10.7 (L)  13.2 - 17.1 g/dL Final   HCT 22/97/9892 32.2 (L)  38.5 - 50.0 % Final   MCV 05/10/2022 91.5  80.0 - 100.0 fL Final   MCH 05/10/2022 30.4  27.0 - 33.0 pg Final   MCHC 05/10/2022 33.2  32.0 - 36.0 g/dL Final   RDW 11/94/1740 12.0  11.0 - 15.0 % Final   Platelets 05/10/2022 347  140 - 400 Thousand/uL Final   MPV 05/10/2022 9.2  7.5 - 12.5 fL Final   Neutro Abs 05/10/2022 5,914  1,500 - 7,800 cells/uL Final   Lymphs Abs 05/10/2022 482.8 (L)  850 - 3,900 cells/uL Final   Absolute Monocytes 05/10/2022 589  200 - 950 cells/uL Final   Eosinophils Absolute 05/10/2022 107  15 - 500 cells/uL Final  Basophils Absolute 05/10/2022 7  0 - 200 cells/uL Final   Neutrophils Relative % 05/10/2022 83.3  % Final   Total Lymphocyte 05/10/2022 6.8  % Final   Monocytes Relative 05/10/2022 8.3  % Final   Eosinophils Relative 05/10/2022 1.5  % Final   Basophils Relative 05/10/2022 0.1  % Final   D-Dimer, Quant 05/10/2022 5.52 (H)  <0.50 mcg/mL FEU Final   Comment: . The D-Dimer test is used frequently to exclude an acute PE or DVT. In patients with a low to moderate clinical risk assessment and a D-Dimer result <0.50 mcg/mL FEU, the likelihood of a PE or DVT is very low. However, a thromboembolic event should not be excluded solely on the basis of the D-Dimer level. Increased levels of D-Dimer are associated with a PE, DVT, DIC, malignancies, inflammation,  sepsis, surgery, trauma, pregnancy, and advancing patient age. [Jama 2006 11:295(2):199-207] . For additional information, please refer to: http://education.questdiagn[employer].com/faq/FAQ149 (This link is being provided for informational/ educational purposes only) .    Glucose, Bld 05/10/2022 147 (H)  65 - 99 mg/dL Final   Comment: .            Fasting reference interval . For someone without known diabetes, a glucose value >125 mg/dL indicates that they may have diabetes and this should be confirmed with a follow-up test. .    BUN 05/10/2022 52 (H)  7 - 25 mg/dL Final   Creat 40/98/1191 2.49 (H)  0.70 - 1.22 mg/dL Final   eGFR 47/82/9562 25 (L)  > OR = 60 mL/min/1.69m2 Final   BUN/Creatinine Ratio 05/10/2022 21  6 - 22 (calc) Final   Sodium 05/10/2022 136  135 - 146 mmol/L Final   Potassium 05/10/2022 4.7  3.5 - 5.3 mmol/L Final   Chloride 05/10/2022 100  98 - 110 mmol/L Final   CO2 05/10/2022 25  20 - 32 mmol/L Final   Calcium 05/10/2022 8.8  8.6 - 10.3 mg/dL Final   Total Protein 13/09/6576 6.6  6.1 - 8.1 g/dL Final   Albumin 46/96/2952 3.8  3.6 - 5.1 g/dL Final   Globulin 84/13/2440 2.8  1.9 - 3.7 g/dL (calc) Final   AG Ratio 05/10/2022 1.4  1.0 - 2.5 (calc) Final   Total Bilirubin 05/10/2022 0.5  0.2 - 1.2 mg/dL Final   Alkaline phosphatase (APISO) 05/10/2022 153 (H)  35 - 144 U/L Final   AST 05/10/2022 41 (H)  10 - 35 U/L Final   ALT 05/10/2022 42  9 - 46 U/L Final  Office Visit on 05/09/2022  Component Date Value Ref Range Status   WBC 05/09/2022 6.8  3.8 - 10.8 Thousand/uL Final   RBC 05/09/2022 3.44 (L)  4.20 - 5.80 Million/uL Final   Hemoglobin 05/09/2022 10.6 (L)  13.2 - 17.1 g/dL Final   HCT 12/02/2534 32.1 (L)  38.5 - 50.0 % Final   MCV 05/09/2022 93.3  80.0 - 100.0 fL Final   MCH 05/09/2022 30.8  27.0 - 33.0 pg Final   MCHC 05/09/2022 33.0  32.0 - 36.0 g/dL Final   RDW 64/40/3474 12.1  11.0 - 15.0 % Final   Platelets 05/09/2022 316  140 - 400 Thousand/uL  Final   MPV 05/09/2022 9.1  7.5 - 12.5 fL Final   Neutro Abs 05/09/2022 5,556  1,500 - 7,800 cells/uL Final   Lymphs Abs 05/09/2022 428.4 (L)  850 - 3,900 cells/uL Final   Absolute Monocytes 05/09/2022 598  200 - 950 cells/uL Final   Eosinophils Absolute 05/09/2022  197  15 - 500 cells/uL Final   Basophils Absolute 05/09/2022 20  0 - 200 cells/uL Final   Neutrophils Relative % 05/09/2022 81.7  % Final   Total Lymphocyte 05/09/2022 6.3  % Final   Monocytes Relative 05/09/2022 8.8  % Final   Eosinophils Relative 05/09/2022 2.9  % Final   Basophils Relative 05/09/2022 0.3  % Final   Color, Urine 05/09/2022 YELLOW  YELLOW Final   APPearance 05/09/2022 CLEAR  CLEAR Final   Specific Gravity, Urine 05/09/2022 1.010  1.001 - 1.035 Final   pH 05/09/2022 5.5  5.0 - 8.0 Final   Glucose, UA 05/09/2022 2+ (A)  NEGATIVE Final   Bilirubin Urine 05/09/2022 NEGATIVE  NEGATIVE Final   Ketones, ur 05/09/2022 NEGATIVE  NEGATIVE Final   Hgb urine dipstick 05/09/2022 1+ (A)  NEGATIVE Final   Protein, ur 05/09/2022 2+ (A)  NEGATIVE Final   Nitrite 05/09/2022 NEGATIVE  NEGATIVE Final   Leukocytes,Ua 05/09/2022 NEGATIVE  NEGATIVE Final   WBC, UA 05/09/2022 NONE SEEN  0 - 5 /HPF Final   RBC / HPF 05/09/2022 0-2  0 - 2 /HPF Final   Squamous Epithelial / HPF 05/09/2022 0-5  < OR = 5 /HPF Final   Bacteria, UA 05/09/2022 NONE SEEN  NONE SEEN /HPF Final   Hyaline Cast 05/09/2022 NONE SEEN  NONE SEEN /LPF Final   Note 05/09/2022    Final   Comment: This urine was analyzed for the presence of WBC,  RBC, bacteria, casts, and other formed elements.  Only those elements seen were reported. . .    I have ordered a stat VQ scan which has not been scheduled yet.  CT angio of chest abd and pelvis (3/22): IMPRESSION: 1. No aortic dissection or aneurysm. 2. Small left pleural effusion with left lung base atelectasis/scarring. Pneumonia is not excluded. 3. Sigmoid diverticulosis without active inflammatory changes.  No bowel obstruction. 4. Interval increase in size of the hypoenhancing lesion in the distal body and tail of the pancreas. Further characterization with MRI, if not previously performed, is advised.  Wife states that the patient was more confused Thursday after I saw him.  However over the last 24 to 48 hours, the night sweats have ceased.  He has had no further fevers.  His left posterior pleurisy has improved.  Today he is denying any pleurisy.  This leads me to believe that the antibiotics are clinically helping the patient.  The crackles and rails that I heard in his left base have improved. Past Medical History:  Diagnosis Date   Acute blood loss anemia    Acute upper respiratory infections of unspecified site 11/04/2012   AKI (acute kidney injury)    Alzheimer disease    Benign prostatic hyperplasia    CAD (coronary artery disease)    s/p cypher DES to pLAD 6/08; normal LVF;  ETT-Myoview 2009: no ischemia    Clavicle fracture 08/10/2019   Closed displaced fracture of phalanx of left thumb, sequela 08/10/2019   Coronary atherosclerosis of native coronary artery 03/08/2008   Dizziness 03/08/2020   Eosinophilia    Essential hypertension    Fracture of multiple ribs with pain 08/10/2019   Hematoma 11/04/2012   History of multiple falls    History of SAH (subarachnoid hemorrhage) 07/14/2019   Hyperlipidemia type IIB / III 03/08/2008   Itching of ear 07/14/2021   Major neurocognitive disorder due to possible Alzheimer's disease, without behavioral disturbance 02/29/2020   MI (myocardial infarction)  Moderate persistent asthma    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   Imaging revealed [facility]   Multiple trauma    Nocturnal leg cramps 11/23/2012   Orthostatic hypotension 03/08/2008   Otalgia of left ear 05/11/2021   Otorrhagia of left ear 03/30/2021   Pain in joint, shoulder region 11/23/2012   Stage 3b chronic kidney disease    Thrombocytopenia     Trigeminal neuralgia    Type 2 diabetes mellitus    Past Surgical History:  Procedure Laterality Date   APPENDECTOMY     CARDIAC CATHETERIZATION  07/30/2006   CORONARY ANGIOPLASTY WITH STENT PLACEMENT   CARDIAC CATHETERIZATION     CATARACT EXTRACTION Right 03/2022   CATARACT EXTRACTION Left 04/2022   EXPLORATORY LAPAROTOMY     age 66   EXPLORATORY LAPAROTOMY     IR THORACENTESIS ASP PLEURAL SPACE W/IMG GUIDE  10/05/2019   Current Outpatient Medications on File Prior to Visit  Medication Sig Dispense Refill   albuterol (PROAIR HFA) 108 (90 Base) MCG/ACT inhaler INHALE 2 PUFFS INTO THE LUNGS EVERY 6 HOURS AS NEEDED FOR WHEEZING OR SHORTNESS OF BREATH (Patient taking differently: Inhale 2 puffs into the lungs every 6 (six) hours as needed for wheezing or shortness of breath. INHALE 2 PUFFS INTO THE LUNGS EVERY 6 HOURS AS NEEDED FOR WHEEZING OR SHORTNESS OF BREATH) 6.7 g 0   amiodarone (PACERONE) 200 MG tablet Take 200 mg by mouth 2 (two) times daily.     amoxicillin-clavulanate (AUGMENTIN) 875-125 MG tablet Take 1 tablet by mouth 2 (two) times daily. 20 tablet 0   aspirin EC 81 MG tablet Take 1 tablet (81 mg total) by mouth daily. Swallow whole. 30 tablet 12   atorvastatin (LIPITOR) 20 MG tablet Take 1 tablet (20 mg total) by mouth daily. 90 tablet 3   doxazosin (CARDURA) 2 MG tablet TAKE 1 TABLET BY MOUTH EVERY DAY (Patient taking differently: Take 2 mg by mouth daily.) 90 tablet 1   empagliflozin (JARDIANCE) 10 MG TABS tablet Take 1 tablet (10 mg total) by mouth daily before breakfast. 30 tablet 5   escitalopram (LEXAPRO) 10 MG tablet TAKE 1 TABLET BY MOUTH EVERY DAY (Patient taking differently: Take 10 mg by mouth daily.) 90 tablet 1   ezetimibe (ZETIA) 10 MG tablet TAKE 1 TABLET BY MOUTH DAILY. (Patient taking differently: Take 10 mg by mouth daily. TAKE 1 TABLET BY MOUTH DAILY.) 90 tablet 3   fluticasone furoate-vilanterol (BREO ELLIPTA) 200-25 MCG/ACT AEPB INHALE 1 PUFF DAILY *NEED  APPT* (Patient taking differently: Inhale 1 puff into the lungs daily. INHALE 1 PUFF DAILY *NEED APPT*) 60 each 11   furosemide (LASIX) 40 MG tablet Take 1 tablet (40 mg total) by mouth every other day. 45 tablet 3   loratadine (CLARITIN) 10 MG tablet Take 10 mg by mouth daily as needed for allergies or rhinitis.      meclizine (ANTIVERT) 12.5 MG tablet Take 1 tablet (12.5 mg total) by mouth 3 (three) times daily as needed for dizziness. 15 tablet 0   memantine (NAMENDA) 5 MG tablet TAKE 1 TABLET BY MOUTH EVERYDAY AT BEDTIME 90 tablet 1   Multiple Vitamin (MULTIVITAMIN WITH MINERALS) TABS tablet Take 1 tablet by mouth daily.     No current facility-administered medications on file prior to visit.   Allergies  Allergen Reactions   Gabapentin Other (See Comments)    Visual changes and confusion- "I went blind while I was driving"   Social History  Socioeconomic History   Marital status: Married    Spouse name: Clydie Braun   Number of children: 1   Years of education: 20   Highest education level: Patent examiner History   Occupation: retired professor    Comment: History    Occupation: missionary  Tobacco Use   Smoking status: Never   Smokeless tobacco: Never  Building services engineer Use: Never used  Substance and Sexual Activity   Alcohol use: Not Currently   Drug use: Never   Sexual activity: Yes    Partners: Female  Other Topics Concern   Not on file  Social History Narrative   ** Merged History Encounter **       Lives with his wife (second marriage in 2015, first marriage ended when his wife died alzheimer's disease). Since his remarriage, his adult daughter doesn't speak with him.      Right handed   Social Determinants of Health   Financial Resource Strain: Low Risk  (02/09/2022)   Overall Financial Resource Strain (CARDIA)    Difficulty of Paying Living Expenses: Not hard at all  Food Insecurity: No Food Insecurity (05/09/2022)   Hunger Vital Sign    Worried About  Running Out of Food in the Last Year: Never true    Ran Out of Food in the Last Year: Never true  Transportation Needs: No Transportation Needs (05/09/2022)   PRAPARE - Administrator, Civil Service (Medical): No    Lack of Transportation (Non-Medical): No  Physical Activity: Not on file  Stress: Not on file  Social Connections: Not on file  Intimate Partner Violence: Not At Risk (05/05/2022)   Humiliation, Afraid, Rape, and Kick questionnaire    Fear of Current or Ex-Partner: No    Emotionally Abused: No    Physically Abused: No    Sexually Abused: No   Family History  Problem Relation Age of Onset   Stomach cancer Mother    Memory loss Mother    Heart disease Father    Coronary artery disease Father 59   Heart attack Father    Heart disease Brother    Aortic aneurysm Brother    Colon cancer Brother    Aortic aneurysm Brother    Colon cancer Brother    Arthritis Daughter        rheumatoid   Coronary artery disease Brother    Esophageal cancer Neg Hx    Rectal cancer Neg Hx      Review of Systems  Respiratory:  Positive for cough.   All other systems reviewed and are negative.      Objective:   Physical Exam Vitals reviewed.  Constitutional:      General: He is not in acute distress.    Appearance: He is well-developed. He is not diaphoretic.  Neck:     Thyroid: No thyromegaly.     Vascular: No carotid bruit or JVD.     Trachea: No tracheal deviation.  Cardiovascular:     Rate and Rhythm: Normal rate and regular rhythm.     Heart sounds: Normal heart sounds. No murmur heard.    No friction rub. No gallop.  Pulmonary:     Effort: Pulmonary effort is normal. No accessory muscle usage, prolonged expiration or respiratory distress.     Breath sounds: No stridor. Examination of the left-middle field reveals rhonchi and rales. Examination of the left-lower field reveals rhonchi and rales. Rhonchi and rales present. No wheezing.    Abdominal:  General: Bowel sounds are normal.     Palpations: Abdomen is soft.  Musculoskeletal:     Cervical back: Neck supple. No erythema, rigidity, torticollis or crepitus. No pain with movement, spinous process tenderness or muscular tenderness. Normal range of motion.     Right lower leg: No edema.     Left lower leg: No edema.  Lymphadenopathy:     Cervical: No cervical adenopathy.  Neurological:     Mental Status: He is alert.     Motor: No abnormal muscle tone.     Deep Tendon Reflexes: Reflexes are normal and symmetric.           Assessment & Plan:  Pleurisy  Positive D dimer  Fever, unspecified fever cause All the objective findings have improved since his last visit.  He is having no further night sweats.  He is not having fevers.  The pleurisy has improved.  The physical exam findings of Rales have improved in the left base.  Therefore I believe that were on the right track with the Augmentin.  Complete the 10-day course and recheck in 1 week.  I believe the D-dimer is a false positive secondary to inflammation however I have ordered a VQ scan.  However the patient looks so good today I do not see a reason to put him empirically on Eliquis without a positive VQ scan.  Discussed this with the patient and his wife and they are in agreement

## 2022-05-16 ENCOUNTER — Encounter: Payer: Self-pay | Admitting: Family Medicine

## 2022-05-16 ENCOUNTER — Emergency Department (HOSPITAL_COMMUNITY): Payer: PPO

## 2022-05-16 ENCOUNTER — Other Ambulatory Visit: Payer: Self-pay

## 2022-05-16 ENCOUNTER — Encounter: Payer: PPO | Admitting: *Deleted

## 2022-05-16 ENCOUNTER — Emergency Department (HOSPITAL_COMMUNITY)
Admission: EM | Admit: 2022-05-16 | Discharge: 2022-05-16 | Disposition: A | Discharge status: Home / Self Care | Payer: PPO | Attending: Emergency Medicine | Admitting: Emergency Medicine

## 2022-05-16 ENCOUNTER — Encounter (HOSPITAL_COMMUNITY): Payer: Self-pay | Admitting: Emergency Medicine

## 2022-05-16 ENCOUNTER — Ambulatory Visit: Payer: PPO | Admitting: Pulmonary Disease

## 2022-05-16 DIAGNOSIS — N1832 Chronic kidney disease, stage 3b: Secondary | ICD-10-CM | POA: Insufficient documentation

## 2022-05-16 DIAGNOSIS — Z7982 Long term (current) use of aspirin: Secondary | ICD-10-CM | POA: Insufficient documentation

## 2022-05-16 DIAGNOSIS — I129 Hypertensive chronic kidney disease with stage 1 through stage 4 chronic kidney disease, or unspecified chronic kidney disease: Secondary | ICD-10-CM | POA: Insufficient documentation

## 2022-05-16 DIAGNOSIS — J4540 Moderate persistent asthma, uncomplicated: Secondary | ICD-10-CM | POA: Insufficient documentation

## 2022-05-16 DIAGNOSIS — R0609 Other forms of dyspnea: Secondary | ICD-10-CM | POA: Insufficient documentation

## 2022-05-16 DIAGNOSIS — R0789 Other chest pain: Secondary | ICD-10-CM | POA: Insufficient documentation

## 2022-05-16 DIAGNOSIS — Z7984 Long term (current) use of oral hypoglycemic drugs: Secondary | ICD-10-CM | POA: Insufficient documentation

## 2022-05-16 DIAGNOSIS — G309 Alzheimer's disease, unspecified: Secondary | ICD-10-CM | POA: Insufficient documentation

## 2022-05-16 DIAGNOSIS — E1122 Type 2 diabetes mellitus with diabetic chronic kidney disease: Secondary | ICD-10-CM | POA: Diagnosis not present

## 2022-05-16 DIAGNOSIS — Z79899 Other long term (current) drug therapy: Secondary | ICD-10-CM | POA: Diagnosis not present

## 2022-05-16 DIAGNOSIS — I4891 Unspecified atrial fibrillation: Secondary | ICD-10-CM | POA: Diagnosis not present

## 2022-05-16 DIAGNOSIS — R0602 Shortness of breath: Secondary | ICD-10-CM | POA: Diagnosis not present

## 2022-05-16 DIAGNOSIS — I2510 Atherosclerotic heart disease of native coronary artery without angina pectoris: Secondary | ICD-10-CM | POA: Insufficient documentation

## 2022-05-16 DIAGNOSIS — R079 Chest pain, unspecified: Secondary | ICD-10-CM | POA: Diagnosis not present

## 2022-05-16 DIAGNOSIS — R000 Tachycardia, unspecified: Secondary | ICD-10-CM | POA: Diagnosis not present

## 2022-05-16 LAB — CBC WITH DIFFERENTIAL/PLATELET
Abs Immature Granulocytes: 0.1 10*3/uL — ABNORMAL HIGH (ref 0.00–0.07)
Basophils Absolute: 0 10*3/uL (ref 0.0–0.1)
Basophils Relative: 0 %
Eosinophils Absolute: 0.1 10*3/uL (ref 0.0–0.5)
Eosinophils Relative: 1 %
HCT: 28 % — ABNORMAL LOW (ref 39.0–52.0)
Hemoglobin: 9.1 g/dL — ABNORMAL LOW (ref 13.0–17.0)
Immature Granulocytes: 1 %
Lymphocytes Relative: 6 %
Lymphs Abs: 0.5 10*3/uL — ABNORMAL LOW (ref 0.7–4.0)
MCH: 31.3 pg (ref 26.0–34.0)
MCHC: 32.5 g/dL (ref 30.0–36.0)
MCV: 96.2 fL (ref 80.0–100.0)
Monocytes Absolute: 0.6 10*3/uL (ref 0.1–1.0)
Monocytes Relative: 8 %
Neutro Abs: 5.7 10*3/uL (ref 1.7–7.7)
Neutrophils Relative %: 84 %
Platelets: 290 10*3/uL (ref 150–400)
RBC: 2.91 MIL/uL — ABNORMAL LOW (ref 4.22–5.81)
RDW: 12.7 % (ref 11.5–15.5)
WBC: 7 10*3/uL (ref 4.0–10.5)
nRBC: 0 % (ref 0.0–0.2)

## 2022-05-16 LAB — COMPREHENSIVE METABOLIC PANEL
ALT: 28 U/L (ref 0–44)
AST: 30 U/L (ref 15–41)
Albumin: 2.6 g/dL — ABNORMAL LOW (ref 3.5–5.0)
Alkaline Phosphatase: 88 U/L (ref 38–126)
Anion gap: 9 (ref 5–15)
BUN: 48 mg/dL — ABNORMAL HIGH (ref 8–23)
CO2: 19 mmol/L — ABNORMAL LOW (ref 22–32)
Calcium: 7.8 mg/dL — ABNORMAL LOW (ref 8.9–10.3)
Chloride: 107 mmol/L (ref 98–111)
Creatinine, Ser: 2.31 mg/dL — ABNORMAL HIGH (ref 0.61–1.24)
GFR, Estimated: 27 mL/min — ABNORMAL LOW (ref >60)
Glucose, Bld: 115 mg/dL — ABNORMAL HIGH (ref 70–99)
Potassium: 3.8 mmol/L (ref 3.5–5.1)
Sodium: 135 mmol/L (ref 135–145)
Total Bilirubin: 0.8 mg/dL (ref 0.3–1.2)
Total Protein: 5.7 g/dL — ABNORMAL LOW (ref 6.5–8.1)

## 2022-05-16 LAB — TROPONIN I (HIGH SENSITIVITY)
Troponin I (High Sensitivity): 27 ng/L — ABNORMAL HIGH (ref <18)
Troponin I (High Sensitivity): 35 ng/L — ABNORMAL HIGH (ref <18)

## 2022-05-16 LAB — BRAIN NATRIURETIC PEPTIDE: B Natriuretic Peptide: 261.6 pg/mL — ABNORMAL HIGH (ref 0.0–100.0)

## 2022-05-16 MED ORDER — TECHNETIUM TO 99M ALBUMIN AGGREGATED
4.0000 | Freq: Once | INTRAVENOUS | Status: AC | PRN
  Administered 2022-05-16: 4 via INTRAVENOUS

## 2022-05-16 NOTE — ED Triage Notes (Signed)
Pt BIB [facility] from home due to chest tightness that started this morning.   Pt did take 324 aspirin at home.  Pt reports SHOB along with chest tightness that radiates bilaterally to shoulders.  Pt was in a-fib with [facility] pt has converted back to NSR as pulling up to hospital.  600NS bolus given en route.  18g left hand.

## 2022-05-16 NOTE — ED Provider Notes (Signed)
[facility] AT [facility] Provider Note   CSN: [id number] Arrival date & time: 05/16/22  9179     History {Add pertinent medical, surgical, social history, OB history to HPI:1} Chief Complaint  Patient presents with   Chest Pain    [name] is a 89 y.o. male.  HPI     89yo male with history of alzheimer's, CAD,    Woke up, pain in chest, dizziness, pain in back and pain in front, dyspnea even while just sitting up.  Had similar symptoms with atrial fibrillation in the past, and with "pleurisy" 3/22 has had these symptoms off and on Dyspnea on exertion, just going to the bathroom Sharp pain with deep breath in the back and front No pressure, tightness, arm pain, neck pain  No nausea, no sweating  Not on blood thinner for afib because of fall risk, prior traumatic ICH per family, nose bleed.  Was put on abx last Thursday, had fever prior to that, no remaining fever in the last week.  Has had a cough, mostly at night, hacking  No black or bloody stools, diarrhea Low appetite   Past Medical History:  Diagnosis Date   Acute blood loss anemia    Acute upper respiratory infections of unspecified site 11/04/2012   AKI (acute kidney injury)    Alzheimer disease    Benign prostatic hyperplasia    CAD (coronary artery disease)    s/p cypher DES to pLAD 6/08; normal LVF;  ETT-Myoview 2009: no ischemia    Clavicle fracture 08/10/2019   Closed displaced fracture of phalanx of left thumb, sequela 08/10/2019   Coronary atherosclerosis of native coronary artery 03/08/2008   Dizziness 03/08/2020   Eosinophilia    Essential hypertension    Fracture of multiple ribs with pain 08/10/2019   Hematoma 11/04/2012   History of multiple falls    History of SAH (subarachnoid hemorrhage) 07/14/2019   Hyperlipidemia type IIB / III 03/08/2008   Itching of ear 07/14/2021   Major neurocognitive disorder due to possible Alzheimer's disease, without  behavioral disturbance 02/29/2020   MI (myocardial infarction)    Moderate persistent asthma    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   Imaging revealed [facility]   Multiple trauma    Nocturnal leg cramps 11/23/2012   Orthostatic hypotension 03/08/2008   Otalgia of left ear 05/11/2021   Otorrhagia of left ear 03/30/2021   Pain in joint, shoulder region 11/23/2012   Stage 3b chronic kidney disease    Thrombocytopenia    Trigeminal neuralgia    Type 2 diabetes mellitus      Home Medications Prior to Admission medications   Medication Sig Start Date End Date Taking? Authorizing Provider  albuterol (PROAIR HFA) 108 (90 Base) MCG/ACT inhaler INHALE 2 PUFFS INTO THE LUNGS EVERY 6 HOURS AS NEEDED FOR WHEEZING OR SHORTNESS OF BREATH Patient taking differently: Inhale 2 puffs into the lungs every 6 (six) hours as needed for wheezing or shortness of breath. INHALE 2 PUFFS INTO THE LUNGS EVERY 6 HOURS AS NEEDED FOR WHEEZING OR SHORTNESS OF BREATH 08/06/19   Angiulli, Mcarthur Rossetti, PA-C  amiodarone (PACERONE) 200 MG tablet Take 200 mg by mouth 2 (two) times daily.    [provider]  amoxicillin-clavulanate (AUGMENTIN) 875-125 MG tablet Take 1 tablet by mouth 2 (two) times daily. 05/10/22   Donita Brooks, MD  aspirin EC 81 MG tablet Take 1 tablet (81 mg total) by mouth daily.  Swallow whole. 05/03/22   Almon HerculesGonfa, Taye T, MD  atorvastatin (LIPITOR) 20 MG tablet Take 1 tablet (20 mg total) by mouth daily. 08/17/21   Donita BrooksPickard, Warren T, MD  doxazosin (CARDURA) 2 MG tablet TAKE 1 TABLET BY MOUTH EVERY DAY Patient taking differently: Take 2 mg by mouth daily. 01/18/22   Donita BrooksPickard, Warren T, MD  empagliflozin (JARDIANCE) 10 MG TABS tablet Take 1 tablet (10 mg total) by mouth daily before breakfast. 01/16/22   Donita BrooksPickard, Warren T, MD  escitalopram (LEXAPRO) 10 MG tablet TAKE 1 TABLET BY MOUTH EVERY DAY Patient taking differently: Take 10 mg by mouth daily. 01/19/22   Donita BrooksPickard, Warren T,  MD  ezetimibe (ZETIA) 10 MG tablet TAKE 1 TABLET BY MOUTH DAILY. Patient taking differently: Take 10 mg by mouth daily. TAKE 1 TABLET BY MOUTH DAILY. 05/24/21   Tonny Bollmanooper, Michael, MD  fluticasone furoate-vilanterol (BREO ELLIPTA) 200-25 MCG/ACT AEPB INHALE 1 PUFF DAILY *NEED APPT* Patient taking differently: Inhale 1 puff into the lungs daily. INHALE 1 PUFF DAILY *NEED APPT* 01/16/22   Donita BrooksPickard, Warren T, MD  furosemide (LASIX) 40 MG tablet Take 1 tablet (40 mg total) by mouth every other day. 12/25/21   Tonny Bollmanooper, Michael, MD  loratadine (CLARITIN) 10 MG tablet Take 10 mg by mouth daily as needed for allergies or rhinitis.     [provider]  meclizine (ANTIVERT) 12.5 MG tablet Take 1 tablet (12.5 mg total) by mouth 3 (three) times daily as needed for dizziness. 09/28/20   Cathren LaineSteinl, Kevin, MD  memantine (NAMENDA) 5 MG tablet TAKE 1 TABLET BY MOUTH EVERYDAY AT BEDTIME 05/11/22   Marcos EkeWertman, Sara E, PA-C  Multiple Vitamin (MULTIVITAMIN WITH MINERALS) TABS tablet Take 1 tablet by mouth daily.    [provider]      Allergies    Gabapentin    Review of Systems   Review of Systems  Physical Exam Updated Vital Signs BP 139/65 (BP Location: Right Arm)   Pulse 72   Temp 98.3 F (36.8 C) (Oral)   Resp 15   Ht 5\' 7"  (1.702 m)   Wt 86.2 kg   SpO2 100%   BMI 29.76 kg/m  Physical Exam  ED Results / Procedures / Treatments   Labs (all labs ordered are listed, but only abnormal results are displayed) Labs Reviewed  CBC WITH DIFFERENTIAL/PLATELET - Abnormal; Notable for the following components:      Result Value   RBC 2.91 (*)    Hemoglobin 9.1 (*)    HCT 28.0 (*)    Lymphs Abs 0.5 (*)    Abs Immature Granulocytes 0.10 (*)    All other components within normal limits  COMPREHENSIVE METABOLIC PANEL - Abnormal; Notable for the following components:   CO2 19 (*)    Glucose, Bld 115 (*)    BUN 48 (*)    Creatinine, Ser 2.31 (*)    Calcium 7.8 (*)    Total Protein 5.7 (*)     Albumin 2.6 (*)    GFR, Estimated 27 (*)    All other components within normal limits  TROPONIN I (HIGH SENSITIVITY) - Abnormal; Notable for the following components:   Troponin I (High Sensitivity) 27 (*)    All other components within normal limits  BRAIN NATRIURETIC PEPTIDE  TROPONIN I (HIGH SENSITIVITY)    EKG None  Radiology DG Chest Portable 1 View  Result Date: 05/16/2022 CLINICAL DATA:  Chest pain and tightness beginning this morning. Shortness of breath. EXAM: PORTABLE CHEST 1  VIEW COMPARISON:  05/05/2022 and 04/27/2022 FINDINGS: Lordotic technique is demonstrated. Lungs are adequately inflated with mild stable opacification over the lateral left midlung likely scarring/atelectasis. No definite effusion. Moderate stable cardiomegaly. Remainder of the exam is unchanged. IMPRESSION: 1. No acute findings. 2. Moderate stable cardiomegaly. Stable left mid lung scarring/atelectasis. Electronically Signed   By: Elberta Fortis M.D.   On: 05/16/2022 10:21    Procedures Procedures  {Document cardiac monitor, telemetry assessment procedure when appropriate:1}  Medications Ordered in ED Medications - No data to display  ED Course/ Medical Decision Making/ A&P   {   Click here for ABCD2, HEART and other calculatorsREFRESH Note before signing :1}                          Medical Decision Making Amount and/or Complexity of Data Reviewed Labs: ordered. Radiology: ordered.   ***  {Document critical care time when appropriate:1} {Document review of labs and clinical decision tools ie heart score, Chads2Vasc2 etc:1}  {Document your independent review of radiology images, and any outside records:1} {Document your discussion with family members, caretakers, and with consultants:1} {Document social determinants of health affecting pt's care:1} {Document your decision making why or why not admission, treatments were needed:1} Final Clinical Impression(s) / ED Diagnoses Final diagnoses:   None    Rx / DC Orders ED Discharge Orders     None

## 2022-05-18 DIAGNOSIS — S31000D Unspecified open wound of lower back and pelvis without penetration into retroperitoneum, subsequent encounter: Secondary | ICD-10-CM | POA: Diagnosis not present

## 2022-05-19 DIAGNOSIS — I4891 Unspecified atrial fibrillation: Secondary | ICD-10-CM | POA: Diagnosis not present

## 2022-05-21 ENCOUNTER — Encounter: Payer: Self-pay | Admitting: Cardiovascular Disease

## 2022-05-21 ENCOUNTER — Ambulatory Visit: Payer: Self-pay | Admitting: *Deleted

## 2022-05-21 ENCOUNTER — Ambulatory Visit (INDEPENDENT_AMBULATORY_CARE_PROVIDER_SITE_OTHER): Payer: PPO | Admitting: Family Medicine

## 2022-05-21 ENCOUNTER — Telehealth: Payer: Self-pay

## 2022-05-21 ENCOUNTER — Encounter: Payer: Self-pay | Admitting: Family Medicine

## 2022-05-21 VITALS — BP 120/68 | HR 88 | Temp 97.4°F | Ht 67.0 in | Wt 193.6 lb

## 2022-05-21 DIAGNOSIS — K8689 Other specified diseases of pancreas: Secondary | ICD-10-CM

## 2022-05-21 DIAGNOSIS — R509 Fever, unspecified: Secondary | ICD-10-CM | POA: Diagnosis not present

## 2022-05-21 DIAGNOSIS — R091 Pleurisy: Secondary | ICD-10-CM

## 2022-05-21 DIAGNOSIS — R350 Frequency of micturition: Secondary | ICD-10-CM | POA: Diagnosis not present

## 2022-05-21 DIAGNOSIS — R0602 Shortness of breath: Secondary | ICD-10-CM

## 2022-05-21 LAB — URINALYSIS, ROUTINE W REFLEX MICROSCOPIC
Bacteria, UA: NONE SEEN /HPF
Bilirubin Urine: NEGATIVE
Ketones, ur: NEGATIVE
Leukocytes,Ua: NEGATIVE
Nitrite: NEGATIVE
Specific Gravity, Urine: 1.01 (ref 1.001–1.035)
WBC, UA: NONE SEEN /HPF (ref 0–5)
pH: 5.5 (ref 5.0–8.0)

## 2022-05-21 LAB — MICROSCOPIC MESSAGE

## 2022-05-21 NOTE — Progress Notes (Signed)
Subjective:    Patient ID: [name], male    DOB: 1933-07-10, 89 y.o.   MRN: [id number]   05/10/22 Patient has been admitted to the hospital twice recently.  The first time was then late March.  At that time he was having central chest pain radiating in between his shoulder blades.  He was found to be in atrial fibrillation with rapid ventricular response.  Patient had a CT scan of his chest at that time which revealed a density in the left lingula thought to be atelectasis and a chronic left-sided pleural effusion.  Patient was discharged home and then developed pain radiating into his left arm as well as chest pain.  He went back to the hospital at the end of March.  His x-ray was remarkable except for the pleural effusion and left-sided atelectasis.  Patient was diagnosed with pleurisy and was discharged home.  He continues to have pleuritic pain with the majority the pain being in his left posterior ribs with deep inspiration.  He is also started having night sweats.  His wife reports dyspnea on exertion.  He has had a fever over 100 for the last 2 days although he is afebrile here.  He is somewhat hard yesterday and a urinalysis was unremarkable.  Today on examination, he has prominent left-sided crackles in the lower and mid left lung fields.  Right side of his lungs clear.  He is speaking in full sentences.  He is not short of breath at rest.  He nontoxic-appearing.  The rest of his exam is normal aside from stage 1 pressure sore over his left ischial spine.  Of note, there was an enlarging lesion in his pancreas felt to be an IPMN but an MRI was recommended for further assessment.  At that time, my plan was: Patient's physical exam is markedly abnormal with basilar rales heard in his left lung.  Although he has a left-sided pleural effusion and atelectasis I am concerned that he may be developing pneumonia that has been otherwise obscured by these findings.  The other possibility for pleurisy  would be pulmonary embolism.  I will check a CBC a D-dimer and CMP.  Start the patient empirically on Augmentin.  If D-dimer is elevated repeat CT scan of the chest to evaluate for PE.  If workup is negative and antibiotics do not help with night sweats, proceed with an MRI of the abdomen to evaluate pancreas further.  Recheck on Monday  05/14/22 Admission on 05/16/2022, Discharged on 05/16/2022  Component Date Value Ref Range Status   WBC 05/16/2022 7.0  4.0 - 10.5 K/uL Final   RBC 05/16/2022 2.91 (L)  4.22 - 5.81 MIL/uL Final   Hemoglobin 05/16/2022 9.1 (L)  13.0 - 17.0 g/dL Final   HCT 48/25/0037 28.0 (L)  39.0 - 52.0 % Final   MCV 05/16/2022 96.2  80.0 - 100.0 fL Final   MCH 05/16/2022 31.3  26.0 - 34.0 pg Final   MCHC 05/16/2022 32.5  30.0 - 36.0 g/dL Final   RDW 04/88/8916 12.7  11.5 - 15.5 % Final   Platelets 05/16/2022 290  150 - 400 K/uL Final   nRBC 05/16/2022 0.0  0.0 - 0.2 % Final   Neutrophils Relative % 05/16/2022 84  % Final   Neutro Abs 05/16/2022 5.7  1.7 - 7.7 K/uL Final   Lymphocytes Relative 05/16/2022 6  % Final   Lymphs Abs 05/16/2022 0.5 (L)  0.7 - 4.0 K/uL Final   Monocytes  Relative 05/16/2022 8  % Final   Monocytes Absolute 05/16/2022 0.6  0.1 - 1.0 K/uL Final   Eosinophils Relative 05/16/2022 1  % Final   Eosinophils Absolute 05/16/2022 0.1  0.0 - 0.5 K/uL Final   Basophils Relative 05/16/2022 0  % Final   Basophils Absolute 05/16/2022 0.0  0.0 - 0.1 K/uL Final   Immature Granulocytes 05/16/2022 1  % Final   Abs Immature Granulocytes 05/16/2022 0.10 (H)  0.00 - 0.07 K/uL Final   Performed at [facility], [address] [place], [place] [address]   Sodium 05/16/2022 135  135 - 145 mmol/L Final   Potassium 05/16/2022 3.8  3.5 - 5.1 mmol/L Final   Chloride 05/16/2022 107  98 - 111 mmol/L Final   CO2 05/16/2022 19 (L)  22 - 32 mmol/L Final   Glucose, Bld 05/16/2022 115 (H)  70 - 99 mg/dL Final   Glucose reference range applies only to samples taken after  fasting for at least 8 hours.   BUN 05/16/2022 48 (H)  8 - 23 mg/dL Final   Creatinine, Ser 05/16/2022 2.31 (H)  0.61 - 1.24 mg/dL Final   Calcium 26/20/3559 7.8 (L)  8.9 - 10.3 mg/dL Final   Total Protein 74/16/3845 5.7 (L)  6.5 - 8.1 g/dL Final   Albumin 36/46/8032 2.6 (L)  3.5 - 5.0 g/dL Final   AST 01/28/8249 30  15 - 41 U/L Final   ALT 05/16/2022 28  0 - 44 U/L Final   Alkaline Phosphatase 05/16/2022 88  38 - 126 U/L Final   Total Bilirubin 05/16/2022 0.8  0.3 - 1.2 mg/dL Final   GFR, Estimated 05/16/2022 27 (L)  >60 mL/min Final   Comment: (NOTE) Calculated using the CKD-EPI Creatinine Equation (2021)    Anion gap 05/16/2022 9  5 - 15 Final   Performed at [facility], [address] [place], [place] [address]   Troponin I (High Sensitivity) 05/16/2022 27 (H)  <18 ng/L Final   Comment: (NOTE) Elevated high sensitivity troponin I (hsTnI) values and significant  changes across serial measurements may suggest ACS but many other  chronic and acute conditions are known to elevate hsTnI results.  Refer to the Links section for chest pain algorithms and additional  guidance. Performed at [facility], [address], [place], [place] [address]    B Natriuretic Peptide 05/16/2022 261.6 (H)  0.0 - 100.0 pg/mL Final   Performed at [facility], [address], [place], [place] [address]   Troponin I (High Sensitivity) 05/16/2022 35 (H)  <18 ng/L Final   Comment: (NOTE) Elevated high sensitivity troponin I (hsTnI) values and significant  changes across serial measurements may suggest ACS but many other  chronic and acute conditions are known to elevate hsTnI results.  Refer to the "Links" section for chest pain algorithms and additional  guidance. Performed at [facility], [address] [place], [place] [address]    I have ordered a stat VQ scan which has not been scheduled yet.  CT angio of chest abd and pelvis (3/22): IMPRESSION: 1. No aortic  dissection or aneurysm. 2. Small left pleural effusion with left lung base atelectasis/scarring. Pneumonia is not excluded. 3. Sigmoid diverticulosis without active inflammatory changes. No bowel obstruction. 4. Interval increase in size of the hypoenhancing lesion in the distal body and tail of the pancreas. Further characterization with MRI, if not previously performed, is advised.  Wife states that  the patient was more confused Thursday after I saw him.  However over the last 24 to 48 hours, the night sweats have ceased.  He has had no further fevers.  His left posterior pleurisy has improved.  Today he is denying any pleurisy.  This leads me to believe that the antibiotics are clinically helping the patient.  The crackles and rails that I heard in his left base have improved.  At that time, my plan was: All the objective findings have improved since his last visit.  He is having no further night sweats.  He is not having fevers.  The pleurisy has improved.  The physical exam findings of Rales have improved in the left base.  Therefore I believe that were on the right track with the Augmentin.  Complete the 10-day course and recheck in 1 week.  I believe the D-dimer is a false positive secondary to inflammation however I have ordered a VQ scan.  However the patient looks so good today I do not see a reason to put him empirically on Eliquis without a positive VQ scan.  Discussed this with the patient and his wife and they are in agreement  05/21/22 Patient ultimately went to the hospital VQ scan negative.  He states that his pleurisy is better.  He now is right short pain.  Previously with left shoulder pain.  I believe this is most likely musculoskeletal.  However the pleurisy has improved.  He is no longer having any fevers or chills.  He denies any shortness of breath.  His renal function was increased from his baseline which is around 2.  I believe this was likely due to contrast however he is also  still on Jardiance and a fluid pill every other day.  His wife states that he has an upset stomach after taking the antibiotics but he also has decreased appetite related to his dementia.  His weight is hovering between 190 and 200 pounds over the last 6 months. Past Medical History:  Diagnosis Date   Acute blood loss anemia    Acute upper respiratory infections of unspecified site 11/04/2012   AKI (acute kidney injury)    Alzheimer disease    Benign prostatic hyperplasia    CAD (coronary artery disease)    s/p cypher DES to pLAD 6/08; normal LVF;  ETT-Myoview 2009: no ischemia    Clavicle fracture 08/10/2019   Closed displaced fracture of phalanx of left thumb, sequela 08/10/2019   Coronary atherosclerosis of native coronary artery 03/08/2008   Dizziness 03/08/2020   Eosinophilia    Essential hypertension    Fracture of multiple ribs with pain 08/10/2019   Hematoma 11/04/2012   History of multiple falls    History of SAH (subarachnoid hemorrhage) 07/14/2019   Hyperlipidemia type IIB / III 03/08/2008   Itching of ear 07/14/2021   Major neurocognitive disorder due to possible Alzheimer's disease, without behavioral disturbance 02/29/2020   MI (myocardial infarction)    Moderate persistent asthma    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   Imaging revealed [facility]   Multiple trauma    Nocturnal leg cramps 11/23/2012   Orthostatic hypotension 03/08/2008   Otalgia of left ear 05/11/2021   Otorrhagia of left ear 03/30/2021   Pain in joint, shoulder region 11/23/2012   Stage 3b chronic kidney disease    Thrombocytopenia    Trigeminal neuralgia    Type 2 diabetes mellitus    Past Surgical History:  Procedure Laterality Date  APPENDECTOMY     CARDIAC CATHETERIZATION  07/30/2006   CORONARY ANGIOPLASTY WITH STENT PLACEMENT   CARDIAC CATHETERIZATION     CATARACT EXTRACTION Right 03/2022   CATARACT EXTRACTION Left 04/2022   EXPLORATORY LAPAROTOMY      age 47   EXPLORATORY LAPAROTOMY     IR THORACENTESIS ASP PLEURAL SPACE W/IMG GUIDE  10/05/2019   Current Outpatient Medications on File Prior to Visit  Medication Sig Dispense Refill   albuterol (PROAIR HFA) 108 (90 Base) MCG/ACT inhaler INHALE 2 PUFFS INTO THE LUNGS EVERY 6 HOURS AS NEEDED FOR WHEEZING OR SHORTNESS OF BREATH (Patient taking differently: Inhale 2 puffs into the lungs every 6 (six) hours as needed for wheezing or shortness of breath. INHALE 2 PUFFS INTO THE LUNGS EVERY 6 HOURS AS NEEDED FOR WHEEZING OR SHORTNESS OF BREATH) 6.7 g 0   amiodarone (PACERONE) 200 MG tablet Take 200 mg by mouth 2 (two) times daily.     amoxicillin-clavulanate (AUGMENTIN) 875-125 MG tablet Take 1 tablet by mouth 2 (two) times daily. 20 tablet 0   aspirin EC 81 MG tablet Take 1 tablet (81 mg total) by mouth daily. Swallow whole. 30 tablet 12   atorvastatin (LIPITOR) 20 MG tablet Take 1 tablet (20 mg total) by mouth daily. 90 tablet 3   doxazosin (CARDURA) 2 MG tablet TAKE 1 TABLET BY MOUTH EVERY DAY (Patient taking differently: Take 2 mg by mouth daily.) 90 tablet 1   empagliflozin (JARDIANCE) 10 MG TABS tablet Take 1 tablet (10 mg total) by mouth daily before breakfast. 30 tablet 5   escitalopram (LEXAPRO) 10 MG tablet TAKE 1 TABLET BY MOUTH EVERY DAY (Patient taking differently: Take 10 mg by mouth daily.) 90 tablet 1   ezetimibe (ZETIA) 10 MG tablet TAKE 1 TABLET BY MOUTH DAILY. (Patient taking differently: Take 10 mg by mouth daily. TAKE 1 TABLET BY MOUTH DAILY.) 90 tablet 3   fluticasone furoate-vilanterol (BREO ELLIPTA) 200-25 MCG/ACT AEPB INHALE 1 PUFF DAILY *NEED APPT* (Patient taking differently: Inhale 1 puff into the lungs daily. INHALE 1 PUFF DAILY *NEED APPT*) 60 each 11   furosemide (LASIX) 40 MG tablet Take 1 tablet (40 mg total) by mouth every other day. 45 tablet 3   loratadine (CLARITIN) 10 MG tablet Take 10 mg by mouth daily as needed for allergies or rhinitis.      meclizine (ANTIVERT)  12.5 MG tablet Take 1 tablet (12.5 mg total) by mouth 3 (three) times daily as needed for dizziness. 15 tablet 0   memantine (NAMENDA) 5 MG tablet TAKE 1 TABLET BY MOUTH EVERYDAY AT BEDTIME 90 tablet 1   Multiple Vitamin (MULTIVITAMIN WITH MINERALS) TABS tablet Take 1 tablet by mouth daily.     No current facility-administered medications on file prior to visit.   Allergies  Allergen Reactions   Gabapentin Other (See Comments)    Visual changes and confusion- "I went blind while I was driving"   Social History   Socioeconomic History   Marital status: Married    Spouse name: Clydie Braun   Number of children: 1   Years of education: 20   Highest education level: Doctorate  Occupational History   Occupation: retired professor    Comment: History    Occupation: missionary  Tobacco Use   Smoking status: Never   Smokeless tobacco: Never  Building services engineer Use: Never used  Substance and Sexual Activity   Alcohol use: Not Currently   Drug use: Never   Sexual activity:  Yes    Partners: Female  Other Topics Concern   Not on file  Social History Narrative   ** Merged History Encounter **       Lives with his wife (second marriage in 2015, first marriage ended when his wife died alzheimer's disease). Since his remarriage, his adult daughter doesn't speak with him.      Right handed   Social Determinants of Health   Financial Resource Strain: Low Risk  (02/09/2022)   Overall Financial Resource Strain (CARDIA)    Difficulty of Paying Living Expenses: Not hard at all  Food Insecurity: No Food Insecurity (05/09/2022)   Hunger Vital Sign    Worried About Running Out of Food in the Last Year: Never true    Ran Out of Food in the Last Year: Never true  Transportation Needs: No Transportation Needs (05/09/2022)   PRAPARE - Administrator, Civil Service (Medical): No    Lack of Transportation (Non-Medical): No  Physical Activity: Not on file  Stress: Not on file  Social  Connections: Not on file  Intimate Partner Violence: Not At Risk (05/05/2022)   Humiliation, Afraid, Rape, and Kick questionnaire    Fear of Current or Ex-Partner: No    Emotionally Abused: No    Physically Abused: No    Sexually Abused: No   Family History  Problem Relation Age of Onset   Stomach cancer Mother    Memory loss Mother    Heart disease Father    Coronary artery disease Father 39   Heart attack Father    Heart disease Brother    Aortic aneurysm Brother    Colon cancer Brother    Aortic aneurysm Brother    Colon cancer Brother    Arthritis Daughter        rheumatoid   Coronary artery disease Brother    Esophageal cancer Neg Hx    Rectal cancer Neg Hx      Review of Systems  Respiratory:  Positive for cough.   All other systems reviewed and are negative.      Objective:   Physical Exam Vitals reviewed.  Constitutional:      General: He is not in acute distress.    Appearance: He is well-developed. He is not diaphoretic.  Neck:     Thyroid: No thyromegaly.     Vascular: No carotid bruit or JVD.     Trachea: No tracheal deviation.  Cardiovascular:     Rate and Rhythm: Normal rate and regular rhythm.     Heart sounds: Normal heart sounds. No murmur heard.    No friction rub. No gallop.  Pulmonary:     Effort: Pulmonary effort is normal. No accessory muscle usage, prolonged expiration or respiratory distress.     Breath sounds: No stridor. Examination of the left-middle field reveals rhonchi and rales. Examination of the left-lower field reveals rhonchi and rales. Rhonchi and rales present. No wheezing.    Abdominal:     General: Bowel sounds are normal.     Palpations: Abdomen is soft.  Musculoskeletal:     Cervical back: Neck supple. No erythema, rigidity, torticollis or crepitus. No pain with movement, spinous process tenderness or muscular tenderness. Normal range of motion.     Right lower leg: No edema.     Left lower leg: No edema.   Lymphadenopathy:     Cervical: No cervical adenopathy.  Neurological:     Mental Status: He is alert.  Motor: No abnormal muscle tone.     Deep Tendon Reflexes: Reflexes are normal and symmetric.           Assessment & Plan:  Urinary frequency - Plan: Urinalysis, Routine w reflex microscopic, BASIC METABOLIC PANEL WITH GFR  Pleurisy  Fever, unspecified fever cause  SOB (shortness of breath)  Pancreatic mass - Plan: MR Abdomen W Wo Contrast Patient did report some urinary frequency however urinalysis shows +2 glucose but no nitrates and no leukocyte esterase.  I believe the frequency is likely due to the diuretic and the Jardiance.  The pleurisy the fever and the shortness of breath have improved after the antibiotic so I do believe the patient likely level of ammonia.  VQ scan was negative for any pulmonary lives with his creatinine is risen to between 2.3 and 2.5 after contrast.  Repeat creatinine today.  If persistently elevated I plan to hold his diuretic and use it only as needed.  I believe his lack of appetite is related to his dementia.  I believe the upset stomach is related to an antibiotic so I recommend he try probiotic for 1 week.  If the upset stomach improves but continues to have lack of appetite I would recommend switching Lexapro to mirtazapine.  I will get an MRI as radiology recommended to follow-up the increasing mass seen in the tail of the pancreas

## 2022-05-21 NOTE — Telephone Encounter (Signed)
     Patient  visit on 05/16/2022  at [facility] was for chest pain.  Have you been able to follow up with your primary care physician? Yes  The patient was or was not able to obtain any needed medicine or equipment. No medication prescribed.  Are there diet recommendations that you are having difficulty following? No  Patient expresses understanding of discharge instructions and education provided has no other needs at this time. Yes   [name]  [facility] Population Health Community Resource Care Guide   [email or web address]  ?? [phone]   Website: [email or web address]  [email or web address]

## 2022-05-21 NOTE — Patient Outreach (Signed)
  Care Coordination   Initial Visit Note   05/21/2022 Name: AROLDO ROKUSEK MRN: [id number] DOB: [birth year]  [name] is a 89 y.o. year old male who sees Pickard, Priscille Heidelberg, MD for primary care. I spoke with  [name] by phone today.  What matters to the patients health and wellness today?  No further chest  pain, decrease appetite- nausea, finished amoxicillin which may have cause some of the nausea- now taking probiotics  MRI -growth of pancreas    Goals Addressed             This Visit's Progress    Patient Stated   On track    Interventions Today    Flowsheet Row Most Recent Value  Chronic Disease   Chronic disease during today's visit Other  [chest pain, decrease appetite, Pending MRI og pancreas]  General Interventions   General Interventions Discussed/Reviewed General Interventions Discussed, Programmer, applications, Doctor Visits  Doctor Visits Discussed/Reviewed Doctor Visits Discussed, PCP  PCP/Specialist Visits Compliance with follow-up visit  Nutrition Interventions   Nutrition Discussed/Reviewed Nutrition Discussed, Supplmental nutrition  Pharmacy Interventions   Pharmacy Dicussed/Reviewed Pharmacy Topics Discussed              SDOH assessments and interventions completed:  Yes  SDOH Interventions Today    Flowsheet Row Most Recent Value  SDOH Interventions   Housing Interventions Intervention Not Indicated  Transportation Interventions Intervention Not Indicated        Care Coordination Interventions:  Yes, provided   Follow up plan: Follow up call scheduled for 06/04/22    Encounter Outcome:  Pt. Visit Completed    Castulo Scarpelli L. Noelle Penner, RN, BSN, CCM [facility] Care Management Community Coordinator Office number [phone]

## 2022-05-21 NOTE — Patient Instructions (Addendum)
Visit Information  Thank you for taking time to visit with me today. Please don't hesitate to contact me if I can be of assistance to you.   Following are the goals we discussed today:   Goals Addressed             This Visit's Progress    Patient Stated   On track    Interventions Today    Flowsheet Row Most Recent Value  Chronic Disease   Chronic disease during today's visit Other  [chest pain, decrease appetite, Pending MRI og pancreas]  General Interventions   General Interventions Discussed/Reviewed General Interventions Discussed, Community Resources, Doctor Visits  Doctor Visits Discussed/Reviewed Doctor Visits Discussed, PCP  PCP/Specialist Visits Compliance with follow-up visit  Nutrition Interventions   Nutrition Discussed/Reviewed Nutrition Discussed, Supplmental nutrition  Pharmacy Interventions   Pharmacy Dicussed/Reviewed Pharmacy Topics Discussed              Our next appointment is by telephone on 06/04/22 at 2:45 pm  Please call the care guide team at [phone] if you need to cancel or reschedule your appointment.   If you are experiencing a Mental Health or Behavioral Health Crisis or need someone to talk to, please call the Suicide and Crisis Lifeline: 988 call the [place] National Suicide Prevention Lifeline: 807-683-2934 or TTY: 430-166-6594 TTY 747-875-0943) to talk to a trained counselor call 1-800-273-TALK (toll free, 24 hour hotline) go to [facility] [address], [place] [phone]) call the [facility] Crisis Line: [phone] call 911   Patient verbalizes understanding of instructions and care plan provided today and agrees to view in MyChart. Active MyChart status and patient understanding of how to access instructions and care plan via MyChart confirmed with patient.     The patient has been provided with contact information for the care management team and has been advised to call with  any health related questions or concerns.   Nichole Keltner L. Noelle Penner, RN, BSN, CCM [facility] Care Management Community Coordinator Office number [phone]

## 2022-05-22 DIAGNOSIS — N1832 Chronic kidney disease, stage 3b: Secondary | ICD-10-CM | POA: Diagnosis not present

## 2022-05-22 DIAGNOSIS — G309 Alzheimer's disease, unspecified: Secondary | ICD-10-CM | POA: Diagnosis not present

## 2022-05-22 DIAGNOSIS — D631 Anemia in chronic kidney disease: Secondary | ICD-10-CM | POA: Diagnosis not present

## 2022-05-22 DIAGNOSIS — E782 Mixed hyperlipidemia: Secondary | ICD-10-CM | POA: Diagnosis not present

## 2022-05-22 DIAGNOSIS — F0284 Dementia in other diseases classified elsewhere, unspecified severity, with anxiety: Secondary | ICD-10-CM | POA: Diagnosis not present

## 2022-05-22 DIAGNOSIS — D696 Thrombocytopenia, unspecified: Secondary | ICD-10-CM | POA: Diagnosis not present

## 2022-05-22 DIAGNOSIS — I951 Orthostatic hypotension: Secondary | ICD-10-CM | POA: Diagnosis not present

## 2022-05-22 DIAGNOSIS — K5730 Diverticulosis of large intestine without perforation or abscess without bleeding: Secondary | ICD-10-CM | POA: Diagnosis not present

## 2022-05-22 DIAGNOSIS — K219 Gastro-esophageal reflux disease without esophagitis: Secondary | ICD-10-CM | POA: Diagnosis not present

## 2022-05-22 DIAGNOSIS — Z6832 Body mass index (BMI) 32.0-32.9, adult: Secondary | ICD-10-CM | POA: Diagnosis not present

## 2022-05-22 DIAGNOSIS — F32A Depression, unspecified: Secondary | ICD-10-CM | POA: Diagnosis not present

## 2022-05-22 DIAGNOSIS — I2510 Atherosclerotic heart disease of native coronary artery without angina pectoris: Secondary | ICD-10-CM | POA: Diagnosis not present

## 2022-05-22 DIAGNOSIS — I252 Old myocardial infarction: Secondary | ICD-10-CM | POA: Diagnosis not present

## 2022-05-22 DIAGNOSIS — F0283 Dementia in other diseases classified elsewhere, unspecified severity, with mood disturbance: Secondary | ICD-10-CM | POA: Diagnosis not present

## 2022-05-22 DIAGNOSIS — G500 Trigeminal neuralgia: Secondary | ICD-10-CM | POA: Diagnosis not present

## 2022-05-22 DIAGNOSIS — I130 Hypertensive heart and chronic kidney disease with heart failure and stage 1 through stage 4 chronic kidney disease, or unspecified chronic kidney disease: Secondary | ICD-10-CM | POA: Diagnosis not present

## 2022-05-22 DIAGNOSIS — N400 Enlarged prostate without lower urinary tract symptoms: Secondary | ICD-10-CM | POA: Diagnosis not present

## 2022-05-22 DIAGNOSIS — J4540 Moderate persistent asthma, uncomplicated: Secondary | ICD-10-CM | POA: Diagnosis not present

## 2022-05-22 DIAGNOSIS — E669 Obesity, unspecified: Secondary | ICD-10-CM | POA: Diagnosis not present

## 2022-05-22 DIAGNOSIS — J4489 Other specified chronic obstructive pulmonary disease: Secondary | ICD-10-CM | POA: Diagnosis not present

## 2022-05-22 DIAGNOSIS — I083 Combined rheumatic disorders of mitral, aortic and tricuspid valves: Secondary | ICD-10-CM | POA: Diagnosis not present

## 2022-05-22 DIAGNOSIS — I4891 Unspecified atrial fibrillation: Secondary | ICD-10-CM | POA: Diagnosis not present

## 2022-05-22 DIAGNOSIS — I5033 Acute on chronic diastolic (congestive) heart failure: Secondary | ICD-10-CM | POA: Diagnosis not present

## 2022-05-22 DIAGNOSIS — E1122 Type 2 diabetes mellitus with diabetic chronic kidney disease: Secondary | ICD-10-CM | POA: Diagnosis not present

## 2022-05-22 LAB — BASIC METABOLIC PANEL WITH GFR
BUN/Creatinine Ratio: 21 (calc) (ref 6–22)
BUN: 48 mg/dL — ABNORMAL HIGH (ref 7–25)
CO2: 26 mmol/L (ref 20–32)
Calcium: 8.8 mg/dL (ref 8.6–10.3)
Chloride: 101 mmol/L (ref 98–110)
Creat: 2.34 mg/dL — ABNORMAL HIGH (ref 0.70–1.22)
Glucose, Bld: 140 mg/dL — ABNORMAL HIGH (ref 65–99)
Potassium: 4.6 mmol/L (ref 3.5–5.3)
Sodium: 139 mmol/L (ref 135–146)
eGFR: 26 mL/min/{1.73_m2} — ABNORMAL LOW (ref >=60)

## 2022-05-23 ENCOUNTER — Other Ambulatory Visit: Payer: Self-pay | Admitting: Cardiovascular Disease

## 2022-05-24 ENCOUNTER — Ambulatory Visit: Payer: PPO | Attending: Cardiovascular Disease | Admitting: Cardiovascular Disease

## 2022-05-24 ENCOUNTER — Telehealth: Payer: Self-pay

## 2022-05-24 ENCOUNTER — Encounter: Payer: Self-pay | Admitting: Cardiovascular Disease

## 2022-05-24 VITALS — BP 134/56 | HR 71 | Ht 67.0 in | Wt 189.8 lb

## 2022-05-24 DIAGNOSIS — E782 Mixed hyperlipidemia: Secondary | ICD-10-CM | POA: Diagnosis not present

## 2022-05-24 DIAGNOSIS — I2510 Atherosclerotic heart disease of native coronary artery without angina pectoris: Secondary | ICD-10-CM

## 2022-05-24 DIAGNOSIS — I4891 Unspecified atrial fibrillation: Secondary | ICD-10-CM | POA: Diagnosis not present

## 2022-05-24 DIAGNOSIS — Z79899 Other long term (current) drug therapy: Secondary | ICD-10-CM

## 2022-05-24 MED ORDER — AMIODARONE HCL 200 MG PO TABS: 200.0000 mg | ORAL_TABLET | Freq: Every day | ORAL | 3 refills | Status: DC

## 2022-05-24 NOTE — Patient Instructions (Signed)
Medication Instructions:  DECREASE Amiodarone to 200mg  daily *If you need a refill on your cardiac medications before your next appointment, please call your pharmacy*   Lab Work: CMET, TSH (prior to next visit) If you have labs (blood work) drawn today and your tests are completely normal, you will receive your results only by: MyChart Message (if you have MyChart) OR A paper copy in the mail If you have any lab test that is abnormal or we need to change your treatment, we will call you to review the results.   Testing/Procedures: NONE   Follow-Up: At [facility], you and your health needs are our priority.  As part of our continuing mission to provide you with exceptional heart care, we have created designated Provider Care Teams.  These Care Teams include your primary Cardiologist (physician) and Advanced Practice Providers (APPs -  Physician Assistants and Nurse Practitioners) who all work together to provide you with the care you need, when you need it.  We recommend signing up for the patient portal called "MyChart".  Sign up information is provided on this After Visit Summary.  MyChart is used to connect with patients for Virtual Visits (Telemedicine).  Patients are able to view lab/test results, encounter notes, upcoming appointments, etc.  Non-urgent messages can be sent to your provider as well.   To learn more about what you can do with MyChart, go to [email or web address].    Your next appointment:   3 month(s)  Provider:   Robin Searing, NP, Eligha Bridegroom, NP, or Tereso Newcomer, PA-C     Then, Tonny Bollman, MD will plan to see you again in 1 year(s).

## 2022-05-24 NOTE — Telephone Encounter (Signed)
Called pt's wife, [name] to advise of lab results. [name] is very tearful on the phone and states pt is just sleeping and will not eat. Pt saw Cardiologist today and he did decrease the patient's Amiodarone dose. [name] states they have not heard about when MRI will be scheduled and if they should even go ahead with it. [name] asks what do you think should be their next steps?

## 2022-05-24 NOTE — Progress Notes (Signed)
Cardiology Office Note:    Date:  05/24/2022   ID:  [name], DOB 10-08-1933, MRN [id number]  PCP:  Donita Brooks, MD   [facility] Providers Cardiologist:  Tonny Bollman, MD     Referring MD: Donita Brooks, MD   Chief Complaint  Patient presents with   Atrial Fibrillation    History of Present Illness:    [name] is a 89 y.o. male with a hx of coronary artery disease with history of LAD stenting in 2008.  He has also developed paroxysmal atrial fibrillation.  Comorbid conditions include mild bilateral carotid artery stenosis, hypertension, hyperlipidemia, and stage III chronic kidney disease.  Patient underwent PCI with DES to proximal LAD in 2008 with a Cypher DES. He was also noted to have moderate residual coronary disease at that time for which medical therapy was recommended. He has done well since then with no recurrent ischemic events.   The patient is here with his wife today.  He has developed significant dementia.  He is not on oral anticoagulation because of recurrent falls and high bleeding risk.  He was hospitalized earlier this year with heart failure and atrial fibrillation with RVR.  He was placed on amiodarone at that time.  He is noted to have diminished appetite since starting amiodarone.  No chest pain or shortness of breath.  Past Medical History:  Diagnosis Date   Acute blood loss anemia    Acute upper respiratory infections of unspecified site 11/04/2012   AKI (acute kidney injury)    Alzheimer disease    Benign prostatic hyperplasia    CAD (coronary artery disease)    s/p cypher DES to pLAD 6/08; normal LVF;  ETT-Myoview 2009: no ischemia    Clavicle fracture 08/10/2019   Closed displaced fracture of phalanx of left thumb, sequela 08/10/2019   Coronary atherosclerosis of native coronary artery 03/08/2008   Dizziness 03/08/2020   Eosinophilia    Essential hypertension    Fracture of multiple ribs with pain  08/10/2019   Hematoma 11/04/2012   History of multiple falls    History of SAH (subarachnoid hemorrhage) 07/14/2019   Hyperlipidemia type IIB / III 03/08/2008   Itching of ear 07/14/2021   Major neurocognitive disorder due to possible Alzheimer's disease, without behavioral disturbance 02/29/2020   MI (myocardial infarction)    Moderate persistent asthma    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   Multiple trauma    Nocturnal leg cramps 11/23/2012   Orthostatic hypotension 03/08/2008   Otalgia of left ear 05/11/2021   Otorrhagia of left ear 03/30/2021   Pain in joint, shoulder region 11/23/2012   Stage 3b chronic kidney disease    Thrombocytopenia    Trigeminal neuralgia    Type 2 diabetes mellitus     Past Surgical History:  Procedure Laterality Date   APPENDECTOMY     CARDIAC CATHETERIZATION  07/30/2006   CORONARY ANGIOPLASTY WITH STENT PLACEMENT   CARDIAC CATHETERIZATION     CATARACT EXTRACTION Right 03/2022   CATARACT EXTRACTION Left 04/2022   EXPLORATORY LAPAROTOMY     age 41   EXPLORATORY LAPAROTOMY     IR THORACENTESIS ASP PLEURAL SPACE W/IMG GUIDE  10/05/2019    Current Medications: Current Meds  Medication Sig   albuterol (PROAIR HFA) 108 (90 Base) MCG/ACT inhaler INHALE 2 PUFFS INTO THE LUNGS EVERY 6 HOURS AS NEEDED FOR WHEEZING OR SHORTNESS OF BREATH (Patient taking differently:  Inhale 2 puffs into the lungs every 6 (six) hours as needed for wheezing or shortness of breath. INHALE 2 PUFFS INTO THE LUNGS EVERY 6 HOURS AS NEEDED FOR WHEEZING OR SHORTNESS OF BREATH)   amiodarone (PACERONE) 200 MG tablet Take 1 tablet (200 mg total) by mouth daily.   amoxicillin-clavulanate (AUGMENTIN) 875-125 MG tablet Take 1 tablet by mouth 2 (two) times daily.   aspirin EC 81 MG tablet Take 1 tablet (81 mg total) by mouth daily. Swallow whole.   atorvastatin (LIPITOR) 20 MG tablet Take 1 tablet (20 mg total) by mouth daily.    doxazosin (CARDURA) 2 MG tablet TAKE 1 TABLET BY MOUTH EVERY DAY (Patient taking differently: Take 2 mg by mouth daily.)   empagliflozin (JARDIANCE) 10 MG TABS tablet Take 1 tablet (10 mg total) by mouth daily before breakfast.   escitalopram (LEXAPRO) 10 MG tablet TAKE 1 TABLET BY MOUTH EVERY DAY (Patient taking differently: Take 10 mg by mouth daily.)   ezetimibe (ZETIA) 10 MG tablet TAKE 1 TABLET BY MOUTH EVERY DAY   fluticasone furoate-vilanterol (BREO ELLIPTA) 200-25 MCG/ACT AEPB INHALE 1 PUFF DAILY *NEED APPT* (Patient taking differently: Inhale 1 puff into the lungs daily. INHALE 1 PUFF DAILY *NEED APPT*)   furosemide (LASIX) 40 MG tablet Take 1 tablet (40 mg total) by mouth every other day.   loratadine (CLARITIN) 10 MG tablet Take 10 mg by mouth daily as needed for allergies or rhinitis.    meclizine (ANTIVERT) 12.5 MG tablet Take 1 tablet (12.5 mg total) by mouth 3 (three) times daily as needed for dizziness.   memantine (NAMENDA) 5 MG tablet TAKE 1 TABLET BY MOUTH EVERYDAY AT BEDTIME   Multiple Vitamin (MULTIVITAMIN WITH MINERALS) TABS tablet Take 1 tablet by mouth daily.   [DISCONTINUED] amiodarone (PACERONE) 200 MG tablet Take 200 mg by mouth 2 (two) times daily.     Allergies:   Gabapentin   Social History   Socioeconomic History   Marital status: Married    Spouse name: Clydie Braun   Number of children: 1   Years of education: 20   Highest education level: Patent examiner History   Occupation: retired professor    Comment: History    Occupation: missionary  Tobacco Use   Smoking status: Never   Smokeless tobacco: Never  Building services engineer Use: Never used  Substance and Sexual Activity   Alcohol use: Not Currently   Drug use: Never   Sexual activity: Yes    Partners: Female  Other Topics Concern   Not on file  Social History Narrative   ** Merged History Encounter **       Lives with his wife (second marriage in 2015, first marriage ended when his wife  died alzheimer's disease). Since his remarriage, his adult daughter doesn't speak with him.      Right handed   Social Determinants of Health   Financial Resource Strain: Low Risk  (02/09/2022)   Overall Financial Resource Strain (CARDIA)    Difficulty of Paying Living Expenses: Not hard at all  Food Insecurity: No Food Insecurity (05/09/2022)   Hunger Vital Sign    Worried About Running Out of Food in the Last Year: Never true    Ran Out of Food in the Last Year: Never true  Transportation Needs: No Transportation Needs (05/21/2022)   PRAPARE - Administrator, Civil Service (Medical): No    Lack of Transportation (Non-Medical): No  Physical Activity: Not on file  Stress: Not on file  Social Connections: Not on file     Family History: The patient's family history includes Aortic aneurysm in his brother and brother; Arthritis in his daughter; Colon cancer in his brother and brother; Coronary artery disease in his brother; Coronary artery disease (age of onset: 56) in his father; Heart attack in his father; Heart disease in his brother and father; Memory loss in his mother; Stomach cancer in his mother. There is no history of Esophageal cancer or Rectal cancer.  ROS:   Please see the history of present illness.    All other systems reviewed and are negative.  EKGs/Labs/Other Studies Reviewed:    The following studies were reviewed today: Cardiac Studies & Procedures       ECHOCARDIOGRAM  ECHOCARDIOGRAM LIMITED 04/30/2022  Narrative ECHOCARDIOGRAM LIMITED REPORT    Patient Name:   NAQUAN GARMAN Date of Exam: 04/30/2022 Medical Rec #:  [id number]          Height:       68.0 in Accession #:    [id number]         Weight:       194.2 lb Date of Birth:  [birth year]          BSA:          2.018 m Patient Age:    88 years           BP:           87/52 mmHg Patient Gender: M                  HR:           56 bpm. Exam Location:  Inpatient  Procedure: Limited Echo and  Limited Color Doppler  Indications:     Elevated Troponin  History:         Patient has prior history of Echocardiogram examinations, most recent 01/10/2022. CAD, Arrythmias:Atrial Fibrillation; Risk Factors:Hypertension, Diabetes and Dyslipidemia. CKD, stage 3.  Sonographer:     Lucendia Herrlich Referring Phys:  [id number] Oliver Pila HALL Diagnosing Phys: Arvilla Meres MD  IMPRESSIONS   1. The LV endocardium is not well visualized in any images. EF appears grossly normal.. Left ventricular ejection fraction, by estimation, is 55 to 60%. The left ventricle has normal function. Left ventricular endocardial border not optimally defined to evaluate regional wall motion. There is moderate concentric left ventricular hypertrophy. 2. Right ventricular systolic function is normal. 3. A small pericardial effusion is present. The pericardial effusion is posterior to the left ventricle. There is no evidence of cardiac tamponade. 4. Trivial mitral valve regurgitation. 5. The aortic valve is tricuspid. There is mild calcification of the aortic valve. Aortic valve regurgitation is trivial. 6. Technically limited study due to ppor sound wave transmission.  FINDINGS Left Ventricle: The LV endocardium is not well visualized in any images. EF appears grossly normal. Left ventricular ejection fraction, by estimation, is 55 to 60%. The left ventricle has normal function. Left ventricular endocardial border not optimally defined to evaluate regional wall motion. There is moderate concentric left ventricular hypertrophy.  Right Ventricle: Right ventricular systolic function is normal.  Pericardium: A small pericardial effusion is present. The pericardial effusion is posterior to the left ventricle. There is no evidence of cardiac tamponade.  Mitral Valve: Mild mitral annular calcification. Trivial mitral valve regurgitation.  Tricuspid Valve: The tricuspid valve is not well visualized. Tricuspid valve  regurgitation is trivial.  Aortic Valve: The aortic valve is  tricuspid. There is mild calcification of the aortic valve. Aortic valve regurgitation is trivial.  LEFT VENTRICLE PLAX 2D LVIDd:         4.95 cm LVIDs:         3.70 cm LV PW:         1.20 cm LV IVS:        1.50 cm LVOT diam:     2.30 cm LVOT Area:     4.15 cm   LEFT ATRIUM         Index LA diam:    3.50 cm 1.73 cm/m  AORTA Ao Root diam: 3.70 cm Ao Asc diam:  3.50 cm   SHUNTS Systemic Diam: 2.30 cm  Arvilla Meres MD Electronically signed by Arvilla Meres MD Signature Date/Time: 04/30/2022/12:41:27 PM    Final (Updated)              EKG:  EKG is not ordered today.    Recent Labs: 04/28/2022: Magnesium 1.9 04/30/2022: TSH 1.843 05/16/2022: ALT 28; B Natriuretic Peptide 261.6; Hemoglobin 9.1; Platelets 290 05/21/2022: BUN 48; Creat 2.34; Potassium 4.6; Sodium 139  Recent Lipid Panel    Component Value Date/Time   CHOL 110 04/28/2022 0122   CHOL 129 04/26/2019 0730   TRIG 39 04/28/2022 0122   HDL 50 04/28/2022 0122   HDL 58 04/26/2019 0730   CHOLHDL 2.2 04/28/2022 0122   VLDL 8 04/28/2022 0122   LDLCALC 52 04/28/2022 0122   LDLCALC 52 12/15/2021 0845     Risk Assessment/Calculations:                 Physical Exam:    VS:  BP (!) 134/56   Pulse 71   Ht 5\' 7"  (1.702 m)   Wt 189 lb 12.8 oz (86.1 kg)   SpO2 95%   BMI 29.73 kg/m     Wt Readings from Last 3 Encounters:  05/24/22 189 lb 12.8 oz (86.1 kg)  05/21/22 193 lb 9.6 oz (87.8 kg)  05/16/22 190 lb (86.2 kg)     GEN: Pleasant elderly male in no acute distress HEENT: Normal NECK: No JVD; No carotid bruits LYMPHATICS: No lymphadenopathy CARDIAC: RRR, no murmurs, rubs, gallops RESPIRATORY:  Clear to auscultation without rales, wheezing or rhonchi  ABDOMEN: Soft, non-tender, non-distended MUSCULOSKELETAL: Trace bilateral pretibial edema; No deformity  SKIN: Warm and dry NEUROLOGIC:  Alert and oriented x 3 PSYCHIATRIC:   Normal affect   ASSESSMENT:    1. Medication management   2. Mixed hyperlipidemia   3. Coronary artery disease involving native coronary artery of native heart without angina pectoris   4. Atrial fibrillation with RVR    PLAN:    In order of problems listed above:  The patient continues to be loaded with amiodarone for recent atrial fibrillation.  Advised him to decrease amiodarone to 200 mg daily as I suspect this is causing his nausea and diminished appetite.  He is not a candidate for anticoagulation with dementia and frequent falls.  He also has a past history of subdural hematoma. Treated with atorvastatin No anginal symptoms at present.  He did have elevated troponin during his recent hospitalization which was likely related to demand ischemia in the setting of atrial fibrillation with RVR. Appears to be in sinus rhythm.  Follow-up in 3 months with amiodarone monitoring labs to include a TSH and LFTs.  Decrease dose of amiodarone as outlined above.           Medication Adjustments/Labs and Tests  Ordered: Current medicines are reviewed at length with the patient today.  Concerns regarding medicines are outlined above.  Orders Placed This Encounter  Procedures   Comprehensive metabolic panel   TSH   Meds ordered this encounter  Medications   amiodarone (PACERONE) 200 MG tablet    Sig: Take 1 tablet (200 mg total) by mouth daily.    Dispense:  90 tablet    Refill:  3    Patient Instructions  Medication Instructions:  DECREASE Amiodarone to  daily *If you need a refill on your cardiac medications before your next appointment, please call your pharmacy*   Lab Work: CMET, TSH (prior to next visit) If you have labs (blood work) drawn today and your tests are completely normal, you will receive your results only by: MyChart Message (if you have MyChart) OR A paper copy in the mail If you have any lab test that is abnormal or we need to change your treatment, we  will call you to review the results.   Testing/Procedures: NONE   Follow-Up: At [facility], you and your health needs are our priority.  As part of our continuing mission to provide you with exceptional heart care, we have created designated Provider Care Teams.  These Care Teams include your primary Cardiologist (physician) and Advanced Practice Providers (APPs -  Physician Assistants and Nurse Practitioners) who all work together to provide you with the care you need, when you need it.  We recommend signing up for the patient portal called "MyChart".  Sign up information is provided on this After Visit Summary.  MyChart is used to connect with patients for Virtual Visits (Telemedicine).  Patients are able to view lab/test results, encounter notes, upcoming appointments, etc.  Non-urgent messages can be sent to your provider as well.   To learn more about what you can do with MyChart, go to [email or web address].    Your next appointment:   3 month(s)  Provider:   Robin Searing, NP, Eligha Bridegroom, NP, or Tereso Newcomer, PA-C     Then, Tonny Bollman, MD will plan to see you again in 1 year(s).       Signed, Tonny Bollman, MD  05/24/2022 5:19 PM    [facility]

## 2022-05-28 ENCOUNTER — Encounter: Payer: Self-pay | Admitting: Family Medicine

## 2022-06-01 ENCOUNTER — Telehealth: Payer: Self-pay

## 2022-06-01 NOTE — Telephone Encounter (Signed)
Dorinda Hill OT with [facility] called in to get a verbal order for an additional visit with pt please.   Please call Dorinda Hill with [facility] at [phone]

## 2022-06-03 ENCOUNTER — Encounter: Payer: Self-pay | Admitting: Physician Assistant

## 2022-06-04 ENCOUNTER — Other Ambulatory Visit: Payer: Self-pay | Admitting: Physician Assistant

## 2022-06-04 ENCOUNTER — Encounter: Payer: Self-pay | Admitting: *Deleted

## 2022-06-04 ENCOUNTER — Ambulatory Visit: Payer: Self-pay | Admitting: *Deleted

## 2022-06-04 NOTE — Patient Instructions (Addendum)
Visit Information  Thank you for taking time to visit with me today. Please don't hesitate to contact me if I can be of assistance to you.   PLEASE CONTACT THE BELOW RESPITE RESOURCE recommended by [facility] Social workers & care guides   [employer] [phone] Project CARE (Caregiver Support) (DietDisorder.fr) https://hill.biz/ Project CARE (Caregiver Alternatives to Running on Empty) is the only state-funded, dementia-specific support for caregivers who take care of family members with Alzheimer's disease.  Family Caregivers  [employer], [place] (https://www.mitchell-mills.org/) [phone]    Following are the goals we discussed today:   Goals Addressed             This Visit's Progress    [facility] care coordination services   Not on track     Interventions Today    Flowsheet Row Most Recent Value  Chronic Disease   Chronic disease during today's visit Other, Hypertension (HTN)  [at ridk appetite, pending MRI for pancreas, overnight respite services]  General Interventions   General Interventions Discussed/Reviewed General Interventions Reviewed, Doctor Visits, Communication with  Doctor Visits Discussed/Reviewed Doctor Visits Reviewed, Specialist, PCP  PCP/Specialist Visits Compliance with follow-up visit  Communication with Social Work  [consulted with [facility] Care guides & Social worker for resource for overnight respite services]  Education Interventions   Education Provided Provided Education  [employer], Protein foods (Kefir+)]  Provided Verbal Education On Nutrition, Programmer, applications  Mental Health Interventions   Mental Health Discussed/Reviewed Mental Health Discussed, Coping Strategies  Nutrition Interventions   Nutrition Discussed/Reviewed Nutrition Reviewed, Increaing proteins  Safety Interventions   Safety Discussed/Reviewed Safety Discussed               Our next  appointment is by telephone on 07/04/22 at 2:30 pm  Please call the care guide team at [phone] if you need to cancel or reschedule your appointment.   If you are experiencing a Mental Health or Behavioral Health Crisis or need someone to talk to, please call the Suicide and Crisis Lifeline: 988 call the [place] National Suicide Prevention Lifeline: (970)735-5900 or TTY: 971-021-3685 TTY (423) 203-9758) to talk to a trained counselor call 1-800-273-TALK (toll free, 24 hour hotline) go to [facility] [address], [place] ([phone]) call 911   Patient verbalizes understanding of instructions and care plan provided today and agrees to view in MyChart. Active MyChart status and patient understanding of how to access instructions and care plan via MyChart confirmed with patient.     The patient has been provided with contact information for the care management team and has been advised to call with any health related questions or concerns.   Stevee Valenta L. Noelle Penner, RN, BSN, CCM [facility] Care Management Community Coordinator Office number [phone]

## 2022-06-04 NOTE — Patient Outreach (Signed)
  Care Coordination   Follow Up Visit Note   06/04/2022 Name: [name] MRN: [id number] DOB: November 27, 1933  [name] is a 89 y.o. year old male who sees Pickard, Priscille Heidelberg, MD for primary care. I spoke with Clydie Braun, wife of Pradyun Cyndi Lennert by phone today.  What matters to the patients health and wellness today?  At risk Appetite remains a concern, tolerating peanut butter and protein shakes Megace  discussed  Hypertension blood pressure values now 130-140s  history of isolated 170s during Physical Therapy (PT)= Discussed with pcp   MRI of pancreas pending   Respite caregiver services needed-  Spouse interested in a one night visit to follow up on family business    Patient Active Problem List   Diagnosis Date Noted   Fever 05/09/2022   Chest pain 05/05/2022   CKD (chronic kidney disease) stage 3, GFR 30-59 ml/min (HCC) 05/05/2022   Atrial fibrillation with RVR (HCC) 04/27/2022   Itching of ear 07/14/2021   Otalgia of left ear 05/11/2021   Otorrhagia of left ear 03/30/2021   Dizziness 03/08/2020   Major neurocognitive disorder due to possible Alzheimer's disease, without behavioral disturbance 02/29/2020   CAD (coronary artery disease)    MI (myocardial infarction)    Trigeminal neuralgia    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    Multiple trauma    Benign prostatic hyperplasia    Essential hypertension    Stage 3b chronic kidney disease (HCC)    Thrombocytopenia    History of multiple falls 07/14/2019   History of SAH (subarachnoid hemorrhage) 07/14/2019   Type 2 diabetes mellitus (HCC)    Eosinophilia    Nocturnal leg cramps 11/23/2012   Hematoma 11/04/2012   Hyperlipidemia type IIB / III 03/08/2008   Orthostatic hypotension 03/08/2008   Coronary atherosclerosis of native coronary artery 03/08/2008      Goals Addressed             This Visit's Progress    [facility] care coordination services   Not on track     Interventions  Today    Flowsheet Row Most Recent Value  Chronic Disease   Chronic disease during today's visit Other, Hypertension (HTN)  [at ridk appetite, pending MRI for pancreas, overnight respite services]  General Interventions   General Interventions Discussed/Reviewed General Interventions Reviewed, Doctor Visits, Communication with  Doctor Visits Discussed/Reviewed Doctor Visits Reviewed, Specialist, PCP  PCP/Specialist Visits Compliance with follow-up visit  Communication with Social Work  [consulted with [facility] Care guides & Child psychotherapist for resource for overnight respite services]  Education Interventions   Education Provided Provided Education  [employer], Protein foods (Kefir+)]  Provided Verbal Education On Nutrition, Programmer, applications  Mental Health Interventions   Mental Health Discussed/Reviewed Mental Health Discussed, Coping Strategies  Nutrition Interventions   Nutrition Discussed/Reviewed Nutrition Reviewed, Increaing proteins  Safety Interventions   Safety Discussed/Reviewed Safety Discussed               SDOH assessments and interventions completed:  No     Care Coordination Interventions:  Yes, provided   Follow up plan: Follow up call scheduled for 07/04/22    Encounter Outcome:  Pt. Visit Completed   Ever Halberg L. Noelle Penner, RN, BSN, CCM [facility] Care Management Community Coordinator Office number [phone]

## 2022-06-06 ENCOUNTER — Encounter: Payer: Self-pay | Admitting: Pulmonary Disease

## 2022-06-06 ENCOUNTER — Ambulatory Visit: Payer: PPO | Admitting: Pulmonary Disease

## 2022-06-06 VITALS — BP 136/72 | HR 75 | Ht 66.0 in | Wt 187.0 lb

## 2022-06-06 DIAGNOSIS — J941 Fibrothorax: Secondary | ICD-10-CM | POA: Diagnosis not present

## 2022-06-06 NOTE — Patient Instructions (Signed)
Nice to see you again  I think the discomfort you experience particular in the left side and even some of the left shoulder blade pain could be related to the lung being a little bit collapsed and not expanding.  This can stretch the long and send signals to the pain fibers and this hurts.  Take Tylenol 1000 mg up to 3 times daily as needed, every 8 hours.  Ideally you would only need this 1-2 times a day or less.  If the pain is consistent over a few days and there is a specific spot where it is hurting, it may be worth trying Salonpas lidocaine patches to the area.  Apply in the morning and remove before going to bed.  Continue to apply heat to the area if that continues to provide some relief  Return to clinic as needed

## 2022-06-06 NOTE — Progress Notes (Signed)
@Patient  ID: [name], male    DOB: 1933/06/17, 89 y.o.   MRN: [id number]  No chief complaint on file.   Referring provider: Donita Brooks, MD  HPI:   89 y.o. admitted after fall with broken ribs 07/2019 then readmitted couple weeks later with febrile illness treated for pneumonia with associated effusion status post drainage 2 months later 10/06/2019 exudate by protein number seen in follow-up pleural effusion. Repeat thora 11/2019 with similar results. Cytology negative x 2. Lung nodule seen winter 2022 resolved on subsequent PET images. Has chronic L fibrothorax.  His partner relates has had some ongoing weight loss.  Overall decline.  Unfortunately had some ongoing left-sided chest pain.  Has been in the hospital couple times.  ED notes and discharge summary reviewed.  Reviewed most recent CT scan 04/2022 that reveals chronic atelectatic changes in the lingula and right lower lobe, very small chronic left pleural effusion most likely reflective of fibrothorax on my review interpretation.  Discussed etiology for chest discomfort, left shoulder blade pain may be due to lung entrapment, inability for the lung to expand with chronic atelectasis after chronic pleural effusion.  Partner does relay that this seems to happen more with deep breathing which would make sense.  Questionaires / Pulmonary Flowsheets:   ACT:      No data to display          MMRC: mMRC Dyspnea Scale mMRC Score  01/25/2020  9:03 AM 1    Epworth:      No data to display          Tests:   FENO:  No results found for: "NITRICOXIDE"  PFT:     No data to display          WALK:      No data to display          Imaging: Personally reviewed LONG TERM MONITOR (3-14 DAYS)  Result Date: 05/28/2022 Patch Wear Time:  7 days and 1 hours (2024-04-02T14:40:12-398 to 2024-04-09T16:26:53-0400) Patient had a min HR of 54 bpm, max HR of 181 bpm, and avg HR of 73 bpm. Predominant underlying  rhythm was Sinus Rhythm. Intermittent Bundle Branch Block was present. 4 Supraventricular Tachycardia runs occurred, the run with the fastest interval lasting  7 beats with a max rate of 146 bpm, the longest lasting 7 beats with an avg rate of 105 bpm. Atrial Fibrillation occurred (6% burden), ranging from 85-181 bpm (avg of 124 bpm), the longest lasting 4 hours 39 mins with an avg rate of 131 bpm. Isolated SVEs were rare (<1.0%), SVE Couplets were rare (<1.0%), and SVE Triplets were rare (<1.0%). Isolated VEs were rare (<1.0%), VE Couplets were rare (<1.0%), and no VE Triplets were present. SUMMARY: The basic rhythm is normal sinus with an average HR of 73 bpm There are a few episodes of atrial fibrillation and atrial fibrillation with RVR with an overall burden of 6% No high-grade heart block or pathologic pauses There are rare PVC's without sustained ventricular arrhythmia  NM Pulmonary Perfusion  Result Date: 05/16/2022 CLINICAL DATA:  Atypical chest pain perirenal insufficiency. EXAM: NUCLEAR MEDICINE PERFUSION LUNG SCAN TECHNIQUE: Perfusion images were obtained in multiple projections after intravenous injection of radiopharmaceutical. Ventilation scans intentionally deferred if perfusion scan and chest x-ray adequate for interpretation during COVID 19 epidemic. RADIOPHARMACEUTICALS:  4.0 mCi Tc-25m MAA IV COMPARISON:  Portable chest obtained today. Chest CTA dated 04/27/2022. FINDINGS: Enlarged heart. Focally decreased perfusion in the area of scarring/atelectasis  in the left mid lung. No other perfusion defects. IMPRESSION: Low probability for pulmonary embolism. Electronically Signed   By: Beckie Salts M.D.   On: 05/16/2022 13:07   DG Chest Portable 1 View  Result Date: 05/16/2022 CLINICAL DATA:  Chest pain and tightness beginning this morning. Shortness of breath. EXAM: PORTABLE CHEST 1 VIEW COMPARISON:  05/05/2022 and 04/27/2022 FINDINGS: Lordotic technique is demonstrated. Lungs are adequately  inflated with mild stable opacification over the lateral left midlung likely scarring/atelectasis. No definite effusion. Moderate stable cardiomegaly. Remainder of the exam is unchanged. IMPRESSION: 1. No acute findings. 2. Moderate stable cardiomegaly. Stable left mid lung scarring/atelectasis. Electronically Signed   By: Elberta Fortis M.D.   On: 05/16/2022 10:21     Lab Results: Personally reviewed CBC    Component Value Date/Time   WBC 7.0 05/16/2022 0934   RBC 2.91 (L) 05/16/2022 0934   HGB 9.1 (L) 05/16/2022 0934   HCT 28.0 (L) 05/16/2022 0934   PLT 290 05/16/2022 0934   MCV 96.2 05/16/2022 0934   MCV 100.0 (A) 09/28/2012 0846   MCH 31.3 05/16/2022 0934   MCHC 32.5 05/16/2022 0934   RDW 12.7 05/16/2022 0934   LYMPHSABS 0.5 (L) 05/16/2022 0934   MONOABS 0.6 05/16/2022 0934   EOSABS 0.1 05/16/2022 0934   BASOSABS 0.0 05/16/2022 0934    BMET    Component Value Date/Time   NA 139 05/21/2022 0950   NA 141 02/21/2022 1419   K 4.6 05/21/2022 0950   CL 101 05/21/2022 0950   CO2 26 05/21/2022 0950   GLUCOSE 140 (H) 05/21/2022 0950   BUN 48 (H) 05/21/2022 0950   BUN 36 (H) 02/21/2022 1419   CREATININE 2.34 (H) 05/21/2022 0950   CALCIUM 8.8 05/21/2022 0950   GFRNONAA 27 (L) 05/16/2022 0934   GFRNONAA 47 (L) 01/19/2020 1621   GFRAA 54 (L) 01/19/2020 1621    BNP    Component Value Date/Time   BNP 261.6 (H) 05/16/2022 0934   BNP 159 (H) 04/20/2022 1211    ProBNP    Component Value Date/Time   PROBNP 45.0 07/29/2006 1440    Specialty Problems   None  Allergies  Allergen Reactions   Gabapentin Other (See Comments)    Visual changes and confusion- "I went blind while I was driving"    Immunization History  Administered Date(s) Administered   Fluad Quad(high Dose 65+) 11/10/2020, 12/15/2021   Influenza Split 10/06/2012   Influenza, High Dose Seasonal PF 12/02/2016, 10/26/2018, 10/27/2018, 10/23/2019   Influenza,inj,Quad PF,6+ Mos 11/12/2013, 11/11/2014,  10/20/2015, 11/12/2017   Moderna Sars-Covid-2 Vaccination 03/04/2019, 04/03/2019   Pfizer Covid-19 Vaccine Bivalent Booster 85yrs & up 11/10/2020   Pneumococcal Conjugate-13 03/17/2013   Pneumococcal Polysaccharide-23 02/05/2001   Td 03/01/2009   Zoster, Live 02/25/2008    Past Medical History:  Diagnosis Date   Acute blood loss anemia    Acute upper respiratory infections of unspecified site 11/04/2012   AKI (acute kidney injury) (HCC)    Alzheimer disease (HCC)    Benign prostatic hyperplasia    CAD (coronary artery disease)    s/p cypher DES to pLAD 6/08; normal LVF;  ETT-Myoview 2009: no ischemia    Clavicle fracture 08/10/2019   Closed displaced fracture of phalanx of left thumb, sequela 08/10/2019   Coronary atherosclerosis of native coronary artery 03/08/2008   Dizziness 03/08/2020   Eosinophilia    Essential hypertension    Fracture of multiple ribs with pain 08/10/2019   Hematoma 11/04/2012   History  of multiple falls    History of SAH (subarachnoid hemorrhage) 07/14/2019   Hyperlipidemia type IIB / III 03/08/2008   Itching of ear 07/14/2021   Major neurocognitive disorder due to possible Alzheimer's disease, without behavioral disturbance (HCC) 02/29/2020   MI (myocardial infarction) (HCC)    Moderate persistent asthma    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   Imaging revealed [facility]   Multiple trauma    Nocturnal leg cramps 11/23/2012   Orthostatic hypotension 03/08/2008   Otalgia of left ear 05/11/2021   Otorrhagia of left ear 03/30/2021   Pain in joint, shoulder region 11/23/2012   Stage 3b chronic kidney disease (HCC)    Thrombocytopenia (HCC)    Trigeminal neuralgia    Type 2 diabetes mellitus (HCC)     Tobacco History: Social History   Tobacco Use  Smoking Status Never  Smokeless Tobacco Never   Counseling given: Not Answered   Continue to not smoke  Outpatient Encounter Medications as of 06/06/2022  Medication  Sig   albuterol (PROAIR HFA) 108 (90 Base) MCG/ACT inhaler INHALE 2 PUFFS INTO THE LUNGS EVERY 6 HOURS AS NEEDED FOR WHEEZING OR SHORTNESS OF BREATH (Patient taking differently: Inhale 2 puffs into the lungs every 6 (six) hours as needed for wheezing or shortness of breath. INHALE 2 PUFFS INTO THE LUNGS EVERY 6 HOURS AS NEEDED FOR WHEEZING OR SHORTNESS OF BREATH)   amiodarone (PACERONE) 200 MG tablet Take 1 tablet (200 mg total) by mouth daily.   aspirin EC 81 MG tablet Take 1 tablet (81 mg total) by mouth daily. Swallow whole.   atorvastatin (LIPITOR) 20 MG tablet Take 1 tablet (20 mg total) by mouth daily.   doxazosin (CARDURA) 2 MG tablet TAKE 1 TABLET BY MOUTH EVERY DAY (Patient taking differently: Take 2 mg by mouth daily.)   empagliflozin (JARDIANCE) 10 MG TABS tablet Take 1 tablet (10 mg total) by mouth daily before breakfast.   escitalopram (LEXAPRO) 10 MG tablet TAKE 1 TABLET BY MOUTH EVERY DAY (Patient taking differently: Take 10 mg by mouth daily.)   ezetimibe (ZETIA) 10 MG tablet TAKE 1 TABLET BY MOUTH EVERY DAY   fluticasone furoate-vilanterol (BREO ELLIPTA) 200-25 MCG/ACT AEPB INHALE 1 PUFF DAILY *NEED APPT* (Patient taking differently: Inhale 1 puff into the lungs daily. INHALE 1 PUFF DAILY *NEED APPT*)   furosemide (LASIX) 40 MG tablet Take 1 tablet (40 mg total) by mouth every other day.   loratadine (CLARITIN) 10 MG tablet Take 10 mg by mouth daily as needed for allergies or rhinitis.    meclizine (ANTIVERT) 12.5 MG tablet Take 1 tablet (12.5 mg total) by mouth 3 (three) times daily as needed for dizziness.   memantine (NAMENDA) 5 MG tablet TAKE 1 TABLET BY MOUTH EVERYDAY AT BEDTIME   Multiple Vitamin (MULTIVITAMIN WITH MINERALS) TABS tablet Take 1 tablet by mouth daily.   amoxicillin-clavulanate (AUGMENTIN) 875-125 MG tablet Take 1 tablet by mouth 2 (two) times daily. (Patient not taking: Reported on 06/06/2022)   No facility-administered encounter medications on file as of  06/06/2022.       Physical Exam  BP 136/72 (BP Location: Right Arm, Cuff Size: Normal)   Pulse 75   Ht 5\' 6"  (1.676 m)   Wt 187 lb (84.8 kg)   SpO2 95%   BMI 30.18 kg/m   Wt Readings from Last 5 Encounters:  06/06/22 187 lb (84.8 kg)  05/24/22 189 lb 12.8 oz (86.1 kg)  05/21/22 193 lb 9.6 oz (  87.8 kg)  05/16/22 190 lb (86.2 kg)  05/14/22 195 lb 9.6 oz (88.7 kg)    BMI Readings from Last 5 Encounters:  06/06/22 30.18 kg/m  05/24/22 29.73 kg/m  05/21/22 30.32 kg/m  05/16/22 29.76 kg/m  05/14/22 28.89 kg/m     Physical Exam  General: Well-appearing, no acute distress Respiratory: Normal work of breathing, on room air, Diminished L base compared to R. Otherwise CTAB Extremities: Warm, no edema present  Assessment & Plan:    Recurrent left pleural effusion: Status post thoracentesis late August 2021.  Studies consistent with exudate via protein.  Lymphocytic predominance.  Unclear etiology.  Legrand Rams most likely related to postinflammatory changes after fall on left side versus what is more likely parapneumonic effusion after febrile illness treated with antibiotics 08/2019.   Repeat thoracentesis in the office 11/20/19 similar fluid studies.  Small fibrothorax.  Likely will always be there.  Can always assess for worsening or enlargement in the future.   Left-sided chest pain: With deep breaths.  Suspect pleurisy or lung entrapment related to chronic atelectasis after prolonged and chronic pleural effusion with small residual fibrothorax.  Encouraged Tylenol as needed, consider Salonpas lidocaine patches although not sure how helpful will be if it is truly pleuritic in nature, heat.  Continue supportive measures.    Return if symptoms worsen or fail to improve.   Karren Burly, MD 06/06/2022

## 2022-06-07 DIAGNOSIS — E669 Obesity, unspecified: Secondary | ICD-10-CM | POA: Diagnosis not present

## 2022-06-07 DIAGNOSIS — Z6832 Body mass index (BMI) 32.0-32.9, adult: Secondary | ICD-10-CM | POA: Diagnosis not present

## 2022-06-07 DIAGNOSIS — I083 Combined rheumatic disorders of mitral, aortic and tricuspid valves: Secondary | ICD-10-CM | POA: Diagnosis not present

## 2022-06-07 DIAGNOSIS — D631 Anemia in chronic kidney disease: Secondary | ICD-10-CM | POA: Diagnosis not present

## 2022-06-07 DIAGNOSIS — I252 Old myocardial infarction: Secondary | ICD-10-CM | POA: Diagnosis not present

## 2022-06-07 DIAGNOSIS — E782 Mixed hyperlipidemia: Secondary | ICD-10-CM | POA: Diagnosis not present

## 2022-06-07 DIAGNOSIS — N400 Enlarged prostate without lower urinary tract symptoms: Secondary | ICD-10-CM | POA: Diagnosis not present

## 2022-06-07 DIAGNOSIS — E1122 Type 2 diabetes mellitus with diabetic chronic kidney disease: Secondary | ICD-10-CM | POA: Diagnosis not present

## 2022-06-07 DIAGNOSIS — K5730 Diverticulosis of large intestine without perforation or abscess without bleeding: Secondary | ICD-10-CM | POA: Diagnosis not present

## 2022-06-07 DIAGNOSIS — D696 Thrombocytopenia, unspecified: Secondary | ICD-10-CM | POA: Diagnosis not present

## 2022-06-07 DIAGNOSIS — J4489 Other specified chronic obstructive pulmonary disease: Secondary | ICD-10-CM | POA: Diagnosis not present

## 2022-06-07 DIAGNOSIS — I130 Hypertensive heart and chronic kidney disease with heart failure and stage 1 through stage 4 chronic kidney disease, or unspecified chronic kidney disease: Secondary | ICD-10-CM | POA: Diagnosis not present

## 2022-06-07 DIAGNOSIS — F32A Depression, unspecified: Secondary | ICD-10-CM | POA: Diagnosis not present

## 2022-06-07 DIAGNOSIS — I951 Orthostatic hypotension: Secondary | ICD-10-CM | POA: Diagnosis not present

## 2022-06-07 DIAGNOSIS — G500 Trigeminal neuralgia: Secondary | ICD-10-CM | POA: Diagnosis not present

## 2022-06-07 DIAGNOSIS — I5033 Acute on chronic diastolic (congestive) heart failure: Secondary | ICD-10-CM | POA: Diagnosis not present

## 2022-06-07 DIAGNOSIS — F0284 Dementia in other diseases classified elsewhere, unspecified severity, with anxiety: Secondary | ICD-10-CM | POA: Diagnosis not present

## 2022-06-07 DIAGNOSIS — N1832 Chronic kidney disease, stage 3b: Secondary | ICD-10-CM | POA: Diagnosis not present

## 2022-06-07 DIAGNOSIS — F0283 Dementia in other diseases classified elsewhere, unspecified severity, with mood disturbance: Secondary | ICD-10-CM | POA: Diagnosis not present

## 2022-06-07 DIAGNOSIS — J4540 Moderate persistent asthma, uncomplicated: Secondary | ICD-10-CM | POA: Diagnosis not present

## 2022-06-07 DIAGNOSIS — K219 Gastro-esophageal reflux disease without esophagitis: Secondary | ICD-10-CM | POA: Diagnosis not present

## 2022-06-07 DIAGNOSIS — I2510 Atherosclerotic heart disease of native coronary artery without angina pectoris: Secondary | ICD-10-CM | POA: Diagnosis not present

## 2022-06-07 DIAGNOSIS — I4891 Unspecified atrial fibrillation: Secondary | ICD-10-CM | POA: Diagnosis not present

## 2022-06-07 DIAGNOSIS — G309 Alzheimer's disease, unspecified: Secondary | ICD-10-CM | POA: Diagnosis not present

## 2022-06-14 ENCOUNTER — Ambulatory Visit
Admission: RE | Admit: 2022-06-14 | Discharge: 2022-06-14 | Disposition: A | Discharge status: Home / Self Care | Payer: PPO | Source: Ambulatory Visit | Attending: Family Medicine | Admitting: Family Medicine

## 2022-06-14 DIAGNOSIS — R350 Frequency of micturition: Secondary | ICD-10-CM

## 2022-06-14 DIAGNOSIS — K449 Diaphragmatic hernia without obstruction or gangrene: Secondary | ICD-10-CM | POA: Diagnosis not present

## 2022-06-14 DIAGNOSIS — K862 Cyst of pancreas: Secondary | ICD-10-CM | POA: Diagnosis not present

## 2022-06-14 DIAGNOSIS — K8689 Other specified diseases of pancreas: Secondary | ICD-10-CM

## 2022-06-14 MED ORDER — GADOPICLENOL 0.5 MMOL/ML IV SOLN
9.0000 mL | Freq: Once | INTRAVENOUS | Status: AC | PRN
  Administered 2022-06-14: 9 mL via INTRAVENOUS

## 2022-06-19 ENCOUNTER — Encounter: Payer: Self-pay | Admitting: Family Medicine

## 2022-07-04 ENCOUNTER — Ambulatory Visit: Payer: Self-pay | Admitting: *Deleted

## 2022-07-04 NOTE — Patient Outreach (Signed)
Care Coordination   Follow Up Visit Note   07/04/2022 Name: [name] MRN: [id number] DOB: September 02, 1933  [name] is a 89 y.o. year old male who sees Pickard, Priscille Heidelberg, MD for primary care. I spoke with  [name] by phone today.  What matters to the patients health and wellness today?  Wife has sprung her collar bone, continues at risk appetite, tolerating [facility], still interest in respite care in the home  [name] has found a resolution to assist with relocating her inherited furniture on this weekend.  [name] has increase walking, uses his walker and his mood has worsen at intervals. His appetite remains poor as his medical providers continue to wean him off of Amiodarone but continues to take in 1-2 protein shakes a day. He previously liked to lift weights at the gym & church. [name] agrees today that she may invite catholic church members in more than Sunday for socialization She reports he is seeing his family more  as they have started visiting once a month He is sleeping well at night.  [name] voices interest in home respite services  Prefers to have some one come to the home. She voices understanding to monitor for behavioral changes with a change in routine and environment There is not any veteran benefits or long term care coverage       Goals Addressed             This Visit's Progress    [facility] care coordination services   On track    Interventions Today    Flowsheet Row Most Recent Value  Chronic Disease   Chronic disease during today's visit Other  [respite, socialization, community services]  General Interventions   General Interventions Discussed/Reviewed General Interventions Reviewed, [facility], Doctor Visits, Communication with  Doctor Visits Discussed/Reviewed Doctor Visits Reviewed, Specialist  PCP/Specialist Visits Compliance with follow-up visit  Communication with PCP/Specialists  Merchant navy officer  to [facility] staff, HTA customer service, [employer], [facility] [place] services staff Left message for [name] on [place] [employer] services]  Exercise Interventions   Exercise Discussed/Reviewed Exercise Discussed, Physical Activity  Physical Activity Discussed/Reviewed Physical Activity Discussed  Education Interventions   Education Provided Provided Education  [[name] program and coverage]  Provided Verbal Education On Nutrition, Mental Health/Coping with Illness, Programmer, applications, Development worker, community  Mental Health Interventions   Mental Health Discussed/Reviewed Mental Health Reviewed, Coping Strategies  Nutrition Interventions   Nutrition Discussed/Reviewed Nutrition Reviewed, Increasing proteins, Fluid intake  Pharmacy Interventions   Pharmacy Dicussed/Reviewed Pharmacy Topics Reviewed, Medications and their functions  Safety Interventions   Safety Discussed/Reviewed Safety Reviewed, Home Safety  Home Safety Assistive Devices      Interventions Today    Flowsheet Row Most Recent Value  Chronic Disease   Chronic disease during today's visit Other  [respite, socialization, community services]  General Interventions   General Interventions Discussed/Reviewed General Interventions Reviewed, Programmer, applications, Doctor Visits, Communication with  Doctor Visits Discussed/Reviewed Doctor Visits Reviewed, Specialist  PCP/Specialist Visits Compliance with follow-up visit  Communication with PCP/Specialists  Merchant navy officer to [facility] staff, HTA customer service, Psychologist, forensic, [facility] [place] services staff Left message for [name] on [place] Pal services]  Exercise Interventions   Exercise Discussed/Reviewed Exercise Discussed, Physical Activity  Physical Activity Discussed/Reviewed Physical Activity Discussed  Education Interventions   Education Provided Provided Education  [[name] program and coverage]  Provided Verbal Education On Nutrition, Mental Health/Coping with Illness, [employer],  Insurance Plans  Mental Health Interventions   Mental Health Discussed/Reviewed Mental Health Reviewed, Coping Strategies  Nutrition Interventions   Nutrition Discussed/Reviewed Nutrition Reviewed, Increasing proteins, Fluid intake  Pharmacy Interventions   Pharmacy Dicussed/Reviewed Pharmacy Topics Reviewed, Medications and their functions  Safety Interventions   Safety Discussed/Reviewed Safety Reviewed, Home Safety  Home Safety Assistive Devices                SDOH assessments and interventions completed:  No     Care Coordination Interventions:  Yes, provided   Follow up plan: Follow up call scheduled for 07/11/22    Encounter Outcome:  Pt. Visit Completed    Delmore Sear L. Noelle Penner, RN, BSN, CCM [facility] Care Management Community Coordinator Office number [phone]

## 2022-07-04 NOTE — Patient Instructions (Signed)
Visit Information  Thank you for taking time to visit with me today. Please don't hesitate to contact me if I can be of assistance to you.   Following are the goals we discussed today:   Goals Addressed             This Visit's Progress    [facility] care coordination services   On track    Interventions Today    Flowsheet Row Most Recent Value  Chronic Disease   Chronic disease during today's visit Other  [respite, socialization, community services]  General Interventions   General Interventions Discussed/Reviewed General Interventions Reviewed, [facility], Doctor Visits, Communication with  Doctor Visits Discussed/Reviewed Doctor Visits Reviewed, Specialist  PCP/Specialist Visits Compliance with follow-up visit  Communication with PCP/Specialists  Merchant navy officer to [facility] staff, HTA customer service, [employer], [facility] [place] services staff Left message for Mrs Rizk on [place] Pal services]  Exercise Interventions   Exercise Discussed/Reviewed Exercise Discussed, Physical Activity  Physical Activity Discussed/Reviewed Physical Activity Discussed  Education Interventions   Education Provided Provided Education  [[name] program and coverage]  Provided Verbal Education On Nutrition, Mental Health/Coping with Illness, Programmer, applications, Development worker, community  Mental Health Interventions   Mental Health Discussed/Reviewed Mental Health Reviewed, Coping Strategies  Nutrition Interventions   Nutrition Discussed/Reviewed Nutrition Reviewed, Increasing proteins, Fluid intake  Pharmacy Interventions   Pharmacy Dicussed/Reviewed Pharmacy Topics Reviewed, Medications and their functions  Safety Interventions   Safety Discussed/Reviewed Safety Reviewed, Home Safety  Home Safety Assistive Devices      Interventions Today    Flowsheet Row Most Recent Value  Chronic Disease   Chronic disease during today's visit Other  [respite, socialization, community services]  General Interventions    General Interventions Discussed/Reviewed General Interventions Reviewed, [facility], Doctor Visits, Communication with  Doctor Visits Discussed/Reviewed Doctor Visits Reviewed, Specialist  PCP/Specialist Visits Compliance with follow-up visit  Communication with PCP/Specialists  Merchant navy officer to [facility] staff, HTA customer service, Letta Kocher pals, [facility] [place] services staff Left message for Mrs [place] on [place] Pal services]  Exercise Interventions   Exercise Discussed/Reviewed Exercise Discussed, Physical Activity  Physical Activity Discussed/Reviewed Physical Activity Discussed  Education Interventions   Education Provided Provided Education  [[name] program and coverage]  Provided Verbal Education On Nutrition, Mental Health/Coping with Illness, Programmer, applications, Development worker, community  Mental Health Interventions   Mental Health Discussed/Reviewed Mental Health Reviewed, Coping Strategies  Nutrition Interventions   Nutrition Discussed/Reviewed Nutrition Reviewed, Increasing proteins, Fluid intake  Pharmacy Interventions   Pharmacy Dicussed/Reviewed Pharmacy Topics Reviewed, Medications and their functions  Safety Interventions   Safety Discussed/Reviewed Safety Reviewed, Home Safety  Home Safety Assistive Devices                Our next appointment is by telephone on 07/11/22 at 2 pm  Please call the care guide team at [phone] if you need to cancel or reschedule your appointment.   If you are experiencing a Mental Health or Behavioral Health Crisis or need someone to talk to, please call the Suicide and Crisis Lifeline: 988 call the [place] National Suicide Prevention Lifeline: 662-712-4982 or TTY: 628-593-2100 TTY 712-365-7847) to talk to a trained counselor call 1-800-273-TALK (toll free, 24 hour hotline) go to [facility] [address] [place] [phone]) call 911   Patient verbalizes understanding of instructions and  care plan provided today and agrees to view in MyChart. Active MyChart status and patient understanding of how to access instructions and care plan  via MyChart confirmed with patient.     The patient has been provided with contact information for the care management team and has been advised to call with any health related questions or concerns.   Evertte Sones L. Noelle Penner, RN, BSN, CCM [facility] Care Management Community Coordinator Office number [phone]

## 2022-07-11 ENCOUNTER — Ambulatory Visit: Payer: Self-pay | Admitting: *Deleted

## 2022-07-11 NOTE — Patient Outreach (Addendum)
Care Coordination   Follow Up Visit Note   10/26/2022 update entry for 07/11/22 Name: [name] MRN: [id number] DOB: Aug 07, 1933  [name] is a 89 y.o. year old male who sees Pickard, Priscille Heidelberg, MD for primary care. I spoke with  [name] by phone today.  What matters to the patients health and wellness today?  [name] per wife is doing well. They have received the home respite information RN CM sent to them. [name] has decided to have church members assist to prevent the patient from having confusion with visits from someone he does not know Voiced understanding that other respite care services could be used vs [facility]   Wife is doing better Less pain of sprung collar bone. Pending outpatient rehab in 2 weeks   Voices appreciation of the follow up and denies any other medical/social concerns at this time   Goals Addressed             This Visit's Progress    Manage home safety, memory concerns- care coordination services       Interventions Today    Flowsheet Row Most Recent Value  Chronic Disease   Chronic disease during today's visit Other  [Memory concerns respite resource, caregiver health]  General Interventions   General Interventions Discussed/Reviewed General Interventions Reviewed, Doctor Visits, Community Resources  Doctor Visits Discussed/Reviewed Doctor Visits Reviewed, PCP  PCP/Specialist Visits Compliance with follow-up visit  Exercise Interventions   Exercise Discussed/Reviewed Exercise Reviewed, Physical Activity  Education Interventions   Education Provided Provided Education  Provided Verbal Education On Community Resources  Mental Health Interventions   Mental Health Discussed/Reviewed Mental Health Reviewed, Coping Strategies  Safety Interventions   Safety Discussed/Reviewed Safety Reviewed, Fall Risk      Interventions Today    Flowsheet Row Most Recent Value  Chronic Disease   Chronic disease during today's  visit Other  [Memory concerns respite resource, caregiver health]  General Interventions   General Interventions Discussed/Reviewed General Interventions Reviewed, Doctor Visits, Community Resources  Doctor Visits Discussed/Reviewed Doctor Visits Reviewed, PCP  PCP/Specialist Visits Compliance with follow-up visit  Exercise Interventions   Exercise Discussed/Reviewed Exercise Reviewed, Physical Activity  Education Interventions   Education Provided Provided Education  Provided Verbal Education On Community Resources  Mental Health Interventions   Mental Health Discussed/Reviewed Mental Health Reviewed, Coping Strategies  Safety Interventions   Safety Discussed/Reviewed Safety Reviewed, Fall Risk                SDOH assessments and interventions completed:  No     Care Coordination Interventions:  Yes, provided   Follow up plan: Follow up call scheduled for 11/15/22    Encounter Outcome:  Pt. Visit Completed   Davine Sweney L. Noelle Penner, RN, BSN, CCM [facility] Care Management Community Coordinator Office number [phone]

## 2022-07-14 ENCOUNTER — Other Ambulatory Visit: Payer: Self-pay | Admitting: Family Medicine

## 2022-07-16 NOTE — Telephone Encounter (Signed)
Last OV 05/24/22, with protocol.  Requested Prescriptions  Pending Prescriptions Disp Refills   JARDIANCE 10 MG TABS tablet [Pharmacy Med Name: JARDIANCE 10 MG TABLET] 90 tablet 0    Sig: TAKE 1 TABLET BY MOUTH DAILY BEFORE BREAKFAST.     Endocrinology:  Diabetes - SGLT2 Inhibitors Failed - 07/14/2022  1:10 AM      Failed - Cr in normal range and within 360 days    Creat  Date Value Ref Range Status  05/21/2022 2.34 (H) 0.70 - 1.22 mg/dL Final   Creatinine, Urine  Date Value Ref Range Status  11/18/2020 106 20 - 320 mg/dL Final         Failed - eGFR in normal range and within 360 days    GFR, Est African American  Date Value Ref Range Status  01/19/2020 54 (L) > OR = 60 mL/min/1.50m2 Final   GFR, Est Non African American  Date Value Ref Range Status  01/19/2020 47 (L) > OR = 60 mL/min/1.42m2 Final   GFR, Estimated  Date Value Ref Range Status  05/16/2022 27 (L) >60 mL/min Final    Comment:    (NOTE) Calculated using the CKD-EPI Creatinine Equation (2021)    eGFR  Date Value Ref Range Status  05/21/2022 26 (L) > OR = 60 mL/min/1.40m2 Final  02/21/2022 33 (L) >59 mL/min/1.73 Final         Failed - Valid encounter within last 6 months    Recent Outpatient Visits           1 year ago Viral URI   [facility] Tanya Nones, Priscille Heidelberg, MD   1 year ago Hordeolum externum of left upper eyelid   [facility] Valentino Nose, NP   1 year ago Fever, unspecified fever cause   [facility] Pickard, Priscille Heidelberg, MD   1 year ago Sebaceous cyst   [facility] Tanya Nones, Priscille Heidelberg, MD   2 years ago Itching of ear   [facility] Donita Brooks, MD       Future Appointments             In 1 month Swinyer, Zachary George, NP [facility], [facility]            Passed - HBA1C is between 0 and 7.9 and within 180 days    Hgb A1c MFr Bld  Date Value Ref Range Status   04/28/2022 6.2 (H) 4.8 - 5.6 % Final    Comment:    (NOTE)         Prediabetes: 5.7 - 6.4         Diabetes: >6.4         Glycemic control for adults with diabetes: <7.0

## 2022-07-18 ENCOUNTER — Encounter: Payer: Self-pay | Admitting: Family Medicine

## 2022-07-18 ENCOUNTER — Telehealth: Payer: Self-pay | Admitting: Cardiovascular Disease

## 2022-07-18 DIAGNOSIS — R102 Pelvic and perineal pain: Secondary | ICD-10-CM | POA: Diagnosis not present

## 2022-07-18 DIAGNOSIS — I10 Essential (primary) hypertension: Secondary | ICD-10-CM | POA: Diagnosis not present

## 2022-07-18 DIAGNOSIS — R300 Dysuria: Secondary | ICD-10-CM | POA: Diagnosis not present

## 2022-07-18 NOTE — Telephone Encounter (Signed)
[name] Manis NP with [facility] called saying patient was seen there for urinary issues.  He was negative for UTI, his BP was 158/72, 3+ edema, Question, if patient can be seen before his July visit.  He has not had any lasix, she doesn't know if his is retaining.  Wife said he will follow up with PCP.  [name] was calling us as a courtesy and she told wife she would give [place] a call.

## 2022-07-18 NOTE — Telephone Encounter (Signed)
Called and spoke with Clydie Braun (spouse on DPR) who states she wasn't overly concerned with the swelling because patient's weight has not changed. She had the visit today because his mentation has changed from his baseline and she was questioning if his dementia is worsening or if he could have a possible UTI. Not taking furosemide because was told to take only as needed for weight gain greater than 2lb/day or 5lb/wk. Was seeing PCP weekly for antibiotics for pneumonia and has had very decreased appetite so has not been seeing any weight gains-actually has lost approximately 20lbs per spouse. States appetite has just started to return and he's beginning to gain a few ounces per day, but seems to be mentally worse. Condones that confusion is much different than his usual, "he will go from being okay one day to next day not being able to get up the stairs." Patient was negative for UTI today, but no labs were done (it was a home visit). She has called PCP office, but he is out for the day and is waiting on return call tomorrow or Friday. BP log provided: 6/11 137/70 6/10 124/56 6/9 185/71, later 146/63 6/8 165/74, later 143/61 6/7 145/74 She requests to have current July appt moved up. Re-scheduled with Dr Excell Seltzer for 6/19. Will have CBC and TSH which was already ordered drawn Friday to analyze WBC count to rule out current infectious process.

## 2022-07-20 ENCOUNTER — Ambulatory Visit: Payer: PPO | Attending: Cardiovascular Disease

## 2022-07-20 DIAGNOSIS — Z79899 Other long term (current) drug therapy: Secondary | ICD-10-CM | POA: Diagnosis not present

## 2022-07-20 DIAGNOSIS — I10 Essential (primary) hypertension: Secondary | ICD-10-CM | POA: Diagnosis not present

## 2022-07-20 DIAGNOSIS — I2510 Atherosclerotic heart disease of native coronary artery without angina pectoris: Secondary | ICD-10-CM

## 2022-07-20 DIAGNOSIS — R102 Pelvic and perineal pain: Secondary | ICD-10-CM | POA: Diagnosis not present

## 2022-07-20 DIAGNOSIS — E782 Mixed hyperlipidemia: Secondary | ICD-10-CM | POA: Diagnosis not present

## 2022-07-21 ENCOUNTER — Other Ambulatory Visit: Payer: Self-pay | Admitting: Family Medicine

## 2022-07-21 LAB — COMPREHENSIVE METABOLIC PANEL
ALT: 44 IU/L (ref 0–44)
AST: 50 IU/L — ABNORMAL HIGH (ref 0–40)
Albumin/Globulin Ratio: 1.4
Albumin: 3.6 g/dL — ABNORMAL LOW (ref 3.7–4.7)
Alkaline Phosphatase: 90 IU/L (ref 44–121)
BUN/Creatinine Ratio: 17 (ref 10–24)
BUN: 34 mg/dL — ABNORMAL HIGH (ref 8–27)
Bilirubin Total: 0.6 mg/dL (ref 0.0–1.2)
CO2: 21 mmol/L (ref 20–29)
Calcium: 8.7 mg/dL (ref 8.6–10.2)
Chloride: 101 mmol/L (ref 96–106)
Creatinine, Ser: 2.02 mg/dL — ABNORMAL HIGH (ref 0.76–1.27)
Globulin, Total: 2.6 g/dL (ref 1.5–4.5)
Glucose: 161 mg/dL — ABNORMAL HIGH (ref 70–99)
Potassium: 4.3 mmol/L (ref 3.5–5.2)
Sodium: 138 mmol/L (ref 134–144)
Total Protein: 6.2 g/dL (ref 6.0–8.5)
eGFR: 32 mL/min/{1.73_m2} — ABNORMAL LOW (ref >59)

## 2022-07-21 LAB — TSH: TSH: 2.68 u[IU]/mL (ref 0.450–4.500)

## 2022-07-23 NOTE — Telephone Encounter (Signed)
Requested Prescriptions  Pending Prescriptions Disp Refills   escitalopram (LEXAPRO) 10 MG tablet [Pharmacy Med Name: ESCITALOPRAM 10 MG TABLET] 90 tablet 0    Sig: TAKE 1 TABLET BY MOUTH EVERY DAY     Psychiatry:  Antidepressants - SSRI Failed - 07/21/2022  1:21 AM      Failed - Valid encounter within last 6 months    Recent Outpatient Visits           1 year ago Viral URI   [name] Pickard, Priscille Heidelberg, MD   1 year ago Hordeolum externum of left upper eyelid   [facility] Valentino Nose, NP   1 year ago Fever, unspecified fever cause   [facility] Pickard, Priscille Heidelberg, MD   1 year ago Sebaceous cyst   [facility] Tanya Nones, Priscille Heidelberg, MD   2 years ago Itching of ear   [facility] Pickard, Priscille Heidelberg, MD       Future Appointments             In 2 days Tonny Bollman, MD [facility] at [facility], [facility]             doxazosin (CARDURA) 2 MG tablet [Pharmacy Med Name: DOXAZOSIN MESYLATE 2 MG TAB] 90 tablet 0    Sig: TAKE 1 TABLET BY MOUTH EVERY DAY     Cardiovascular:  Alpha Blockers Failed - 07/21/2022  1:21 AM      Failed - Valid encounter within last 6 months    Recent Outpatient Visits           1 year ago Viral URI   [facility] Tanya Nones, Priscille Heidelberg, MD   1 year ago Hordeolum externum of left upper eyelid   [facility] Valentino Nose, NP   1 year ago Fever, unspecified fever cause   [facility] Pickard, Priscille Heidelberg, MD   1 year ago Sebaceous cyst   [facility] Donita Brooks, MD   2 years ago Itching of ear   [facility] Pickard, Priscille Heidelberg, MD       Future Appointments             In 2 days Tonny Bollman, MD [facility]            Passed - Last BP in normal range    BP Readings from Last 1 Encounters:  06/06/22 136/72

## 2022-07-24 ENCOUNTER — Encounter: Payer: Self-pay | Admitting: Family Medicine

## 2022-07-25 ENCOUNTER — Ambulatory Visit: Payer: PPO | Attending: Nurse Practitioner | Admitting: Cardiovascular Disease

## 2022-07-25 ENCOUNTER — Encounter: Payer: Self-pay | Admitting: Cardiovascular Disease

## 2022-07-25 VITALS — BP 130/72 | HR 66 | Ht 66.0 in | Wt 183.8 lb

## 2022-07-25 DIAGNOSIS — I10 Essential (primary) hypertension: Secondary | ICD-10-CM

## 2022-07-25 DIAGNOSIS — N183 Chronic kidney disease, stage 3 unspecified: Secondary | ICD-10-CM | POA: Diagnosis not present

## 2022-07-25 DIAGNOSIS — I2510 Atherosclerotic heart disease of native coronary artery without angina pectoris: Secondary | ICD-10-CM | POA: Diagnosis not present

## 2022-07-25 DIAGNOSIS — E782 Mixed hyperlipidemia: Secondary | ICD-10-CM

## 2022-07-25 DIAGNOSIS — I4891 Unspecified atrial fibrillation: Secondary | ICD-10-CM | POA: Diagnosis not present

## 2022-07-25 DIAGNOSIS — Z79899 Other long term (current) drug therapy: Secondary | ICD-10-CM | POA: Diagnosis not present

## 2022-07-25 NOTE — Patient Instructions (Addendum)
Medication Instructions:  Your physician recommends that you continue on your current medications as directed. Please refer to the Current Medication list given to you today.  *If you need a refill on your cardiac medications before your next appointment, please call your pharmacy*   Lab Work: CMET in 6 months the day of follow up appointment.  If you have labs (blood work) drawn today and your tests are completely normal, you will receive your results only by: MyChart Message (if you have MyChart) OR A paper copy in the mail If you have any lab test that is abnormal or we need to change your treatment, we will call you to review the results.    Follow-Up: At [facility], you and your health needs are our priority.  As part of our continuing mission to provide you with exceptional heart care, we have created designated Provider Care Teams.  These Care Teams include your primary Cardiologist (physician) and Advanced Practice Providers (APPs -  Physician Assistants and Nurse Practitioners) who all work together to provide you with the care you need, when you need it.  Your next appointment:   6 month(s) with CMET same day  Provider:   Tonny Bollman, MD

## 2022-07-25 NOTE — Progress Notes (Signed)
Cardiology Office Note:    Date:  07/28/2022   ID:  [name], DOB [birth year], MRN [id number]  PCP:  Donita Brooks, MD   [facility] Providers Cardiologist:  Tonny Bollman, MD     Referring MD: Donita Brooks, MD   Chief Complaint  Patient presents with   Coronary Artery Disease    History of Present Illness:    [name] is a 89 y.o. male with a hx of  coronary artery disease with history of LAD stenting in 2008.  He has also developed paroxysmal atrial fibrillation.  Comorbid conditions include mild bilateral carotid artery stenosis, hypertension, hyperlipidemia, and stage III chronic kidney disease.  Patient underwent PCI with DES to proximal LAD in 2008 with a Cypher DES. He was also noted to have moderate residual coronary disease at that time for which medical therapy was recommended. He has done well since then with no recurrent ischemic events.   He is here with his wife today. The patient has developed progressive dementia, but remains interactive and pleasant. He denies CP or dyspnea. Leg edema unchanged. He has lost weight with poor appetite but this has stabilized over recent weeks.   Past Medical History:  Diagnosis Date   Acute blood loss anemia    Acute upper respiratory infections of unspecified site 11/04/2012   AKI (acute kidney injury) (HCC)    Alzheimer disease (HCC)    Benign prostatic hyperplasia    CAD (coronary artery disease)    s/p cypher DES to pLAD 6/08; normal LVF;  ETT-Myoview 2009: no ischemia    Clavicle fracture 08/10/2019   Closed displaced fracture of phalanx of left thumb, sequela 08/10/2019   Coronary atherosclerosis of native coronary artery 03/08/2008   Dizziness 03/08/2020   Eosinophilia    Essential hypertension    Fracture of multiple ribs with pain 08/10/2019   Hematoma 11/04/2012   History of multiple falls    History of SAH (subarachnoid hemorrhage) 07/14/2019   Hyperlipidemia type IIB / III  03/08/2008   Itching of ear 07/14/2021   Major neurocognitive disorder due to possible Alzheimer's disease, without behavioral disturbance (HCC) 02/29/2020   MI (myocardial infarction) (HCC)    Moderate persistent asthma    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   Imaging revealed [facility]   Multiple trauma    Nocturnal leg cramps 11/23/2012   Orthostatic hypotension 03/08/2008   Otalgia of left ear 05/11/2021   Otorrhagia of left ear 03/30/2021   Pain in joint, shoulder region 11/23/2012   Stage 3b chronic kidney disease (HCC)    Thrombocytopenia (HCC)    Trigeminal neuralgia    Type 2 diabetes mellitus (HCC)     Past Surgical History:  Procedure Laterality Date   APPENDECTOMY     CARDIAC CATHETERIZATION  07/30/2006   CORONARY ANGIOPLASTY WITH STENT PLACEMENT   CARDIAC CATHETERIZATION     CATARACT EXTRACTION Right 03/2022   CATARACT EXTRACTION Left 04/2022   EXPLORATORY LAPAROTOMY     age 71   EXPLORATORY LAPAROTOMY     IR THORACENTESIS ASP PLEURAL SPACE W/IMG GUIDE  10/05/2019    Current Medications: Current Meds  Medication Sig   albuterol (PROAIR HFA) 108 (90 Base) MCG/ACT inhaler INHALE 2 PUFFS INTO THE LUNGS EVERY 6 HOURS AS NEEDED FOR WHEEZING OR SHORTNESS OF BREATH (Patient taking differently: Inhale 2 puffs into the lungs every 6 (six) hours as needed for wheezing or shortness of breath. INHALE 2 PUFFS INTO  THE LUNGS EVERY 6 HOURS AS NEEDED FOR WHEEZING OR SHORTNESS OF BREATH)   amiodarone (PACERONE) 200 MG tablet Take 1 tablet (200 mg total) by mouth daily.   aspirin EC 81 MG tablet Take 1 tablet (81 mg total) by mouth daily. Swallow whole.   atorvastatin (LIPITOR) 20 MG tablet Take 1 tablet (20 mg total) by mouth daily.   doxazosin (CARDURA) 2 MG tablet TAKE 1 TABLET BY MOUTH EVERY DAY   empagliflozin (JARDIANCE) 10 MG TABS tablet TAKE 1 TABLET BY MOUTH DAILY BEFORE BREAKFAST.   escitalopram (LEXAPRO) 10 MG tablet TAKE 1 TABLET BY MOUTH  EVERY DAY   ezetimibe (ZETIA) 10 MG tablet TAKE 1 TABLET BY MOUTH EVERY DAY   fluticasone furoate-vilanterol (BREO ELLIPTA) 200-25 MCG/ACT AEPB INHALE 1 PUFF DAILY *NEED APPT* (Patient taking differently: Inhale 1 puff into the lungs daily. INHALE 1 PUFF DAILY *NEED APPT*)   furosemide (LASIX) 40 MG tablet Take 1 tablet (40 mg total) by mouth every other day. (Patient taking differently: Take 40 mg by mouth as needed for edema or fluid.)   loratadine (CLARITIN) 10 MG tablet Take 10 mg by mouth daily as needed for allergies or rhinitis.    meclizine (ANTIVERT) 12.5 MG tablet Take 1 tablet (12.5 mg total) by mouth 3 (three) times daily as needed for dizziness.   memantine (NAMENDA) 5 MG tablet TAKE 1 TABLET BY MOUTH EVERYDAY AT BEDTIME   Multiple Vitamin (MULTIVITAMIN WITH MINERALS) TABS tablet Take 1 tablet by mouth daily.     Allergies:   Gabapentin   Social History   Socioeconomic History   Marital status: Married    Spouse name: [name]   Number of children: 1   Years of education: 20   Highest education level: Patent examiner History   Occupation: retired professor    Comment: History    Occupation: missionary  Tobacco Use   Smoking status: Never   Smokeless tobacco: Never  Building services engineer Use: Never used  Substance and Sexual Activity   Alcohol use: Not Currently   Drug use: Never   Sexual activity: Yes    Partners: Female  Other Topics Concern   Not on file  Social History Narrative   ** Merged History Encounter **       Lives with his wife (second marriage in 2015, first marriage ended when his wife died alzheimer's disease). Since his remarriage, his adult daughter doesn't speak with him.      Right handed   Social Determinants of Health   Financial Resource Strain: Low Risk  (02/09/2022)   Overall Financial Resource Strain (CARDIA)    Difficulty of Paying Living Expenses: Not hard at all  Food Insecurity: No Food Insecurity (05/09/2022)   Hunger Vital  Sign    Worried About Running Out of Food in the Last Year: Never true    Ran Out of Food in the Last Year: Never true  Transportation Needs: No Transportation Needs (05/21/2022)   PRAPARE - Administrator, Civil Service (Medical): No    Lack of Transportation (Non-Medical): No  Physical Activity: Not on file  Stress: Not on file  Social Connections: Not on file     Family History: The patient's family history includes Aortic aneurysm in his brother and brother; Arthritis in his daughter; Colon cancer in his brother and brother; Coronary artery disease in his brother; Coronary artery disease (age of onset: 72) in his father; Heart attack in his father; Heart  disease in his brother and father; Memory loss in his mother; Stomach cancer in his mother. There is no history of Esophageal cancer or Rectal cancer.  ROS:   Please see the history of present illness.    All other systems reviewed and are negative.  EKGs/Labs/Other Studies Reviewed:    The following studies were reviewed today: Cardiac Studies & Procedures       ECHOCARDIOGRAM  ECHOCARDIOGRAM LIMITED 04/30/2022  Narrative ECHOCARDIOGRAM LIMITED REPORT    Patient Name:   [name] Date of Exam: 04/30/2022 Medical Rec #:  [id number]          Height:       68.0 in Accession #:    [id number]         Weight:       194.2 lb Date of Birth:  [birth year]          BSA:          2.018 m Patient Age:    88 years           BP:           87/52 mmHg Patient Gender: M                  HR:           56 bpm. Exam Location:  Inpatient  Procedure: Limited Echo and Limited Color Doppler  Indications:     Elevated Troponin  History:         Patient has prior history of Echocardiogram examinations, most recent 01/10/2022. CAD, Arrythmias:Atrial Fibrillation; Risk Factors:Hypertension, Diabetes and Dyslipidemia. CKD, stage 3.  Sonographer:     Lucendia Herrlich Referring Phys:  [id number] Oliver Pila HALL Diagnosing Phys:  Arvilla Meres MD  IMPRESSIONS   1. The LV endocardium is not well visualized in any images. EF appears grossly normal.. Left ventricular ejection fraction, by estimation, is 55 to 60%. The left ventricle has normal function. Left ventricular endocardial border not optimally defined to evaluate regional wall motion. There is moderate concentric left ventricular hypertrophy. 2. Right ventricular systolic function is normal. 3. A small pericardial effusion is present. The pericardial effusion is posterior to the left ventricle. There is no evidence of cardiac tamponade. 4. Trivial mitral valve regurgitation. 5. The aortic valve is tricuspid. There is mild calcification of the aortic valve. Aortic valve regurgitation is trivial. 6. Technically limited study due to ppor sound wave transmission.  FINDINGS Left Ventricle: The LV endocardium is not well visualized in any images. EF appears grossly normal. Left ventricular ejection fraction, by estimation, is 55 to 60%. The left ventricle has normal function. Left ventricular endocardial border not optimally defined to evaluate regional wall motion. There is moderate concentric left ventricular hypertrophy.  Right Ventricle: Right ventricular systolic function is normal.  Pericardium: A small pericardial effusion is present. The pericardial effusion is posterior to the left ventricle. There is no evidence of cardiac tamponade.  Mitral Valve: Mild mitral annular calcification. Trivial mitral valve regurgitation.  Tricuspid Valve: The tricuspid valve is not well visualized. Tricuspid valve regurgitation is trivial.  Aortic Valve: The aortic valve is tricuspid. There is mild calcification of the aortic valve. Aortic valve regurgitation is trivial.  LEFT VENTRICLE PLAX 2D LVIDd:         4.95 cm LVIDs:         3.70 cm LV PW:         1.20 cm LV IVS:  1.50 cm LVOT diam:     2.30 cm LVOT Area:     4.15 cm   LEFT ATRIUM         Index LA  diam:    3.50 cm 1.73 cm/m  AORTA Ao Root diam: 3.70 cm Ao Asc diam:  3.50 cm   SHUNTS Systemic Diam: 2.30 cm  Arvilla Meres MD Electronically signed by Arvilla Meres MD Signature Date/Time: 04/30/2022/12:41:27 PM    Final (Updated)    MONITORS  LONG TERM MONITOR (3-14 DAYS) 05/28/2022  Narrative Patch Wear Time:  7 days and 1 hours (2024-04-02T14:40:12-398 to 2024-04-09T16:26:53-0400)  Patient had a min HR of 54 bpm, max HR of 181 bpm, and avg HR of 73 bpm. Predominant underlying rhythm was Sinus Rhythm. Intermittent Bundle Branch Block was present. 4 Supraventricular Tachycardia runs occurred, the run with the fastest interval lasting 7 beats with a max rate of 146 bpm, the longest lasting 7 beats with an avg rate of 105 bpm. Atrial Fibrillation occurred (6% burden), ranging from 85-181 bpm (avg of 124 bpm), the longest lasting 4 hours 39 mins with an avg rate of 131 bpm. Isolated SVEs were rare (<1.0%), SVE Couplets were rare (<1.0%), and SVE Triplets were rare (<1.0%). Isolated VEs were rare (<1.0%), VE Couplets were rare (<1.0%), and no VE Triplets were present.  SUMMARY: The basic rhythm is normal sinus with an average HR of 73 bpm There are a few episodes of atrial fibrillation and atrial fibrillation with RVR with an overall burden of 6% No high-grade heart block or pathologic pauses There are rare PVC's without sustained ventricular arrhythmia            EKG:        Recent Labs: 04/28/2022: Magnesium 1.9 05/16/2022: B Natriuretic Peptide 261.6; Hemoglobin 9.1; Platelets 290 07/20/2022: ALT 44; BUN 34; Creatinine, Ser 2.02; Potassium 4.3; Sodium 138; TSH 2.680  Recent Lipid Panel    Component Value Date/Time   CHOL 110 04/28/2022 0122   CHOL 129 04/26/2019 0730   TRIG 39 04/28/2022 0122   HDL 50 04/28/2022 0122   HDL 58 04/26/2019 0730   CHOLHDL 2.2 04/28/2022 0122   VLDL 8 04/28/2022 0122   LDLCALC 52 04/28/2022 0122   LDLCALC 52 12/15/2021 0845      Risk Assessment/Calculations:          Physical Exam:    VS:  BP 130/72   Pulse 66   Ht 5\' 6"  (1.676 m)   Wt 183 lb 12.8 oz (83.4 kg)   SpO2 95%   BMI 29.67 kg/m     Wt Readings from Last 3 Encounters:  07/25/22 183 lb 12.8 oz (83.4 kg)  06/06/22 187 lb (84.8 kg)  05/24/22 189 lb 12.8 oz (86.1 kg)     GEN:  Well nourished, well developed elderly male in no acute distress HEENT: Normal NECK: No JVD; No carotid bruits LYMPHATICS: No lymphadenopathy CARDIAC: RRR, no murmurs, rubs, gallops RESPIRATORY:  Clear to auscultation without rales, wheezing or rhonchi  ABDOMEN: Soft, non-tender, non-distended MUSCULOSKELETAL:  1+ bilateral pretibial edema; No deformity  SKIN: Warm and dry NEUROLOGIC:  Alert and oriented x 3 PSYCHIATRIC:  Normal affect   ASSESSMENT:    1. Medication management   2. Mixed hyperlipidemia   3. Coronary artery disease involving native coronary artery of native heart without angina pectoris   4. Essential hypertension   5. Atrial fibrillation with RVR (HCC)   6. Stage 3 chronic kidney disease, unspecified whether  stage 3a or 3b CKD (HCC)    PLAN:    In order of problems listed above:  Continue current Rx. Balancing management of his edema with CKD. Treated with atorvastatin and zetia. Recent labs reviewed. LDL 52.  No angina. Treated with ASA.  BP controlled. Continue current management.  Maintaining sinus rhythm on amiodarone. Seems to be tolerating rhythm control strategy well. Not on anticoagulation secondary to dementia and recurrent falls. If maintaining sinus rhythm, decrease amiodarone to 100 mg at 6 month follow-up. Check thyroid and liver function studies at follow-up. Creatinine 2.0 on most recent labs, improved over past few months.            Medication Adjustments/Labs and Tests Ordered: Current medicines are reviewed at length with the patient today.  Concerns regarding medicines are outlined above.  Orders Placed This  Encounter  Procedures   Comp Met (CMET)   No orders of the defined types were placed in this encounter.   Patient Instructions  Medication Instructions:  Your physician recommends that you continue on your current medications as directed. Please refer to the Current Medication list given to you today.  *If you need a refill on your cardiac medications before your next appointment, please call your pharmacy*   Lab Work: CMET in 6 months the day of follow up appointment.  If you have labs (blood work) drawn today and your tests are completely normal, you will receive your results only by: MyChart Message (if you have MyChart) OR A paper copy in the mail If you have any lab test that is abnormal or we need to change your treatment, we will call you to review the results.    Follow-Up: At [facility], you and your health needs are our priority.  As part of our continuing mission to provide you with exceptional heart care, we have created designated Provider Care Teams.  These Care Teams include your primary Cardiologist (physician) and Advanced Practice Providers (APPs -  Physician Assistants and Nurse Practitioners) who all work together to provide you with the care you need, when you need it.  Your next appointment:   6 month(s) with CMET same day  Provider:   Tonny Bollman, MD     Signed, Tonny Bollman, MD  07/28/2022 9:11 PM    [facility]

## 2022-07-28 ENCOUNTER — Encounter: Payer: Self-pay | Admitting: Cardiovascular Disease

## 2022-08-02 ENCOUNTER — Encounter: Payer: Self-pay | Admitting: Family Medicine

## 2022-08-14 ENCOUNTER — Other Ambulatory Visit: Payer: PPO

## 2022-08-21 ENCOUNTER — Telehealth: Payer: Self-pay

## 2022-08-21 ENCOUNTER — Other Ambulatory Visit: Payer: Self-pay

## 2022-08-21 ENCOUNTER — Ambulatory Visit: Payer: PPO | Admitting: Nurse Practitioner

## 2022-08-21 DIAGNOSIS — K8689 Other specified diseases of pancreas: Secondary | ICD-10-CM

## 2022-08-21 DIAGNOSIS — I5030 Unspecified diastolic (congestive) heart failure: Secondary | ICD-10-CM

## 2022-08-21 DIAGNOSIS — E119 Type 2 diabetes mellitus without complications: Secondary | ICD-10-CM

## 2022-08-21 DIAGNOSIS — N1832 Chronic kidney disease, stage 3b: Secondary | ICD-10-CM

## 2022-08-21 NOTE — Progress Notes (Signed)
   Care Guide Note  08/21/2022 Name: [name] MRN: [id number] DOB: 24-Apr-1933  Referred by: Donita Brooks, MD Reason for referral : Care Coordination (Outreach to schedule referral with Pharm d )   [name] is a 89 y.o. year old male who is a primary care patient of Tanya Nones, Priscille Heidelberg, MD. [name] was referred to the pharmacist for assistance related to DM.    An unsuccessful telephone outreach was attempted today to contact the patient who was referred to the pharmacy team for assistance with medication assistance. Additional attempts will be made to contact the patient.   Penne Lash, RMA Care Guide [facility]  [place], [place] [address] Direct Dial: [phone] [email or web address]

## 2022-08-22 ENCOUNTER — Ambulatory Visit (INDEPENDENT_AMBULATORY_CARE_PROVIDER_SITE_OTHER): Payer: PPO | Admitting: Podiatry

## 2022-08-22 DIAGNOSIS — Z91199 Patient's noncompliance with other medical treatment and regimen due to unspecified reason: Secondary | ICD-10-CM

## 2022-08-23 ENCOUNTER — Ambulatory Visit: Payer: PPO

## 2022-08-23 ENCOUNTER — Ambulatory Visit: Payer: PPO | Admitting: Podiatry

## 2022-08-23 DIAGNOSIS — E119 Type 2 diabetes mellitus without complications: Secondary | ICD-10-CM

## 2022-08-23 DIAGNOSIS — B351 Tinea unguium: Secondary | ICD-10-CM

## 2022-08-23 DIAGNOSIS — M79609 Pain in unspecified limb: Secondary | ICD-10-CM

## 2022-08-23 DIAGNOSIS — M2010 Hallux valgus (acquired), unspecified foot: Secondary | ICD-10-CM

## 2022-08-23 NOTE — Progress Notes (Signed)
This patient returns to my office for at risk foot care.  This patient requires this care by a professional since this patient will be at risk due to having diabetes. This patient is unable to cut nails himself since the patient cannot reach his nails.These nails are painful walking and wearing shoes.  This patient presents for at risk foot care today.  General Appearance  Alert, conversant and in no acute stress.  Vascular  Dorsalis pedis and posterior tibial  pulses are palpable  bilaterally.  Capillary return is within normal limits  bilaterally. Temperature is within normal limits  bilaterally.  Neurologic  Senn-Weinstein monofilament wire test diminished   bilaterally. Muscle power within normal limits bilaterally.  Nails Thick disfigured discolored nails with subungual debris  from hallux to fifth toes bilaterally. No evidence of bacterial infection or drainage bilaterally.  Orthopedic  No limitations of motion  feet .  No crepitus or effusions noted.  HAV  B/L.  Skin  normotropic skin with no porokeratosis noted bilaterally.  No signs of infections or ulcers noted.     Onychomycosis  Pain in right toes  Pain in left toes  Consent was obtained for treatment procedures.   Mechanical debridement of nails 1-5  bilaterally performed with a nail nipper.  Filed with dremel without incident. Patient qualifies for diabetic shoes due to DPN and Hallux valgus  B/L.   Return office visit    3 months                  Told patient to return for periodic foot care and evaluation due to potential at risk complications.   Helane Gunther DPM

## 2022-08-23 NOTE — Progress Notes (Signed)
Patient presents to the office today for diabetic shoe and insole measuring.  Patient was measured with brannock device to determine size and width for 1 pair of extra depth shoes and foam casted for 3 pair of insoles.   Documentation of medical necessity will be sent to patient's treating diabetic doctor to verify and sign.   Patient's diabetic provider: Gwenith Daily and insoles will be ordered at that time and patient will be notified for an appointment for fitting when they arrive.   Shoe size (per patient): 9.5W   Brannock measurement: 9WD  Patient shoe selection-   Shoe choice:   [id number]  Shoe size ordered: 9.5WD

## 2022-08-27 NOTE — Progress Notes (Signed)
1. No-show for appointment     

## 2022-08-28 ENCOUNTER — Encounter: Payer: Self-pay | Admitting: Family Medicine

## 2022-08-29 ENCOUNTER — Other Ambulatory Visit: Payer: Self-pay

## 2022-08-29 DIAGNOSIS — R42 Dizziness and giddiness: Secondary | ICD-10-CM

## 2022-08-29 DIAGNOSIS — F0390 Unspecified dementia without behavioral disturbance: Secondary | ICD-10-CM

## 2022-08-29 DIAGNOSIS — T07XXXA Unspecified multiple injuries, initial encounter: Secondary | ICD-10-CM

## 2022-08-29 DIAGNOSIS — R296 Repeated falls: Secondary | ICD-10-CM

## 2022-08-29 DIAGNOSIS — I951 Orthostatic hypotension: Secondary | ICD-10-CM

## 2022-08-29 DIAGNOSIS — R0602 Shortness of breath: Secondary | ICD-10-CM

## 2022-08-29 DIAGNOSIS — I5030 Unspecified diastolic (congestive) heart failure: Secondary | ICD-10-CM

## 2022-09-05 ENCOUNTER — Telehealth: Payer: Self-pay | Admitting: Family Medicine

## 2022-09-05 NOTE — Telephone Encounter (Signed)
Received call from Marylene Land with [facility] to report the patient has requested a delay in start of care for PT until 09/12/2022 to coordinate availability of preferred therapist .  Please advise with any questions at [phone]

## 2022-09-05 NOTE — Progress Notes (Signed)
   Care Guide Note  09/05/2022 Name: [name] MRN: [id number] DOB: January 06, 1934  Referred by: Donita Brooks, MD Reason for referral : Care Coordination (Outreach to schedule referral with Pharm d )   [name] is a 89 y.o. year old male who is a primary care patient of Tanya Nones, Priscille Heidelberg, MD. [name] was referred to the pharmacist for assistance related to DM.    Successful contact was made with the patient to discuss pharmacy services including being ready for the pharmacist to call at least 5 minutes before the scheduled appointment time, to have medication bottles and any blood sugar or blood pressure readings ready for review. The patient agreed to meet with the pharmacist via with the pharmacist via telephone visit on (date/time).  09/19/2022  Penne Lash, RMA Care Guide [facility]  [place], [place] [address] Direct Dial: [phone] [email or web address]

## 2022-09-06 ENCOUNTER — Other Ambulatory Visit: Payer: Self-pay | Admitting: Family Medicine

## 2022-09-06 NOTE — Telephone Encounter (Signed)
Requested medication (s) are due for refill today: Yes  Requested medication (s) are on the active medication list: Yes  Last refill:  08/17/21  Future visit scheduled: No  Notes to clinic:  Last labs done while in the hospital around 04/28/22, none during CPE, will need CPE scheduled     Requested Prescriptions  Pending Prescriptions Disp Refills   atorvastatin (LIPITOR) 20 MG tablet [Pharmacy Med Name: ATORVASTATIN 20 MG TABLET] 90 tablet 3    Sig: TAKE 1 TABLET BY MOUTH EVERY DAY     Cardiovascular:  Antilipid - Statins Failed - 09/06/2022  2:28 AM      Failed - Valid encounter within last 12 months    Recent Outpatient Visits           1 year ago Viral URI   [facility] Pickard, Priscille Heidelberg, MD   1 year ago Hordeolum externum of left upper eyelid   [facility] Valentino Nose, NP   1 year ago Fever, unspecified fever cause   [facility] Pickard, Priscille Heidelberg, MD   2 years ago Sebaceous cyst   [facility] Tanya Nones, Priscille Heidelberg, MD   2 years ago Itching of ear   [name] Donita Brooks, MD       Future Appointments             In 4 months Tonny Bollman, MD [facility]            Failed - Lipid Panel in normal range within the last 12 months    Cholesterol, Total  Date Value Ref Range Status  04/26/2019 129 100 - 199 mg/dL Final   Cholesterol  Date Value Ref Range Status  04/28/2022 110 0 - 200 mg/dL Final   LDL Cholesterol (Calc)  Date Value Ref Range Status  12/15/2021 52 mg/dL (calc) Final    Comment:    Reference range: <100 . Desirable range <100 mg/dL for primary prevention;   <70 mg/dL for patients with CHD or diabetic patients  with > or = 2 CHD risk factors. Marland Kitchen LDL-C is now calculated using the Martin-Hopkins  calculation, which is a validated novel method providing  better accuracy than the Friedewald equation in the   estimation of LDL-C.  Horald Pollen et al. Lenox Ahr. 1610;960(45): 2061-2068  (http://education.[employer].com/faq/FAQ164)    LDL Cholesterol  Date Value Ref Range Status  04/28/2022 52 0 - 99 mg/dL Final    Comment:           Total Cholesterol/HDL:CHD Risk Coronary Heart Disease Risk Table                     Men   Women  1/2 Average Risk   3.4   3.3  Average Risk       5.0   4.4  2 X Average Risk   9.6   7.1  3 X Average Risk  23.4   11.0        Use the calculated Patient Ratio above and the CHD Risk Table to determine the patient's CHD Risk.        ATP III CLASSIFICATION (LDL):  <100     mg/dL   Optimal  409-811  mg/dL   Near or Above                    Optimal  130-159  mg/dL  Borderline  160-189  mg/dL   High  >606     mg/dL   Very High Performed at [facility], [address] [place], [place] [address]    HDL  Date Value Ref Range Status  04/28/2022 50 >40 mg/dL Final  10/93/2355 58 >73 mg/dL Final   Triglycerides  Date Value Ref Range Status  04/28/2022 39 <150 mg/dL Final         Passed - Patient is not pregnant

## 2022-09-11 DIAGNOSIS — Z7982 Long term (current) use of aspirin: Secondary | ICD-10-CM | POA: Diagnosis not present

## 2022-09-11 DIAGNOSIS — G309 Alzheimer's disease, unspecified: Secondary | ICD-10-CM | POA: Diagnosis not present

## 2022-09-11 DIAGNOSIS — F0280 Dementia in other diseases classified elsewhere without behavioral disturbance: Secondary | ICD-10-CM | POA: Diagnosis not present

## 2022-09-11 DIAGNOSIS — D696 Thrombocytopenia, unspecified: Secondary | ICD-10-CM | POA: Diagnosis not present

## 2022-09-11 DIAGNOSIS — J4540 Moderate persistent asthma, uncomplicated: Secondary | ICD-10-CM | POA: Diagnosis not present

## 2022-09-11 DIAGNOSIS — J90 Pleural effusion, not elsewhere classified: Secondary | ICD-10-CM | POA: Diagnosis not present

## 2022-09-11 DIAGNOSIS — E782 Mixed hyperlipidemia: Secondary | ICD-10-CM | POA: Diagnosis not present

## 2022-09-11 DIAGNOSIS — K5730 Diverticulosis of large intestine without perforation or abscess without bleeding: Secondary | ICD-10-CM | POA: Diagnosis not present

## 2022-09-11 DIAGNOSIS — I129 Hypertensive chronic kidney disease with stage 1 through stage 4 chronic kidney disease, or unspecified chronic kidney disease: Secondary | ICD-10-CM | POA: Diagnosis not present

## 2022-09-11 DIAGNOSIS — I252 Old myocardial infarction: Secondary | ICD-10-CM | POA: Diagnosis not present

## 2022-09-11 DIAGNOSIS — I4891 Unspecified atrial fibrillation: Secondary | ICD-10-CM | POA: Diagnosis not present

## 2022-09-11 DIAGNOSIS — G500 Trigeminal neuralgia: Secondary | ICD-10-CM | POA: Diagnosis not present

## 2022-09-11 DIAGNOSIS — K869 Disease of pancreas, unspecified: Secondary | ICD-10-CM | POA: Diagnosis not present

## 2022-09-11 DIAGNOSIS — Z955 Presence of coronary angioplasty implant and graft: Secondary | ICD-10-CM | POA: Diagnosis not present

## 2022-09-11 DIAGNOSIS — M2010 Hallux valgus (acquired), unspecified foot: Secondary | ICD-10-CM | POA: Diagnosis not present

## 2022-09-11 DIAGNOSIS — I951 Orthostatic hypotension: Secondary | ICD-10-CM | POA: Diagnosis not present

## 2022-09-11 DIAGNOSIS — I2510 Atherosclerotic heart disease of native coronary artery without angina pectoris: Secondary | ICD-10-CM | POA: Diagnosis not present

## 2022-09-11 DIAGNOSIS — N401 Enlarged prostate with lower urinary tract symptoms: Secondary | ICD-10-CM | POA: Diagnosis not present

## 2022-09-11 DIAGNOSIS — N1832 Chronic kidney disease, stage 3b: Secondary | ICD-10-CM | POA: Diagnosis not present

## 2022-09-11 DIAGNOSIS — Z7984 Long term (current) use of oral hypoglycemic drugs: Secondary | ICD-10-CM | POA: Diagnosis not present

## 2022-09-11 DIAGNOSIS — Z7951 Long term (current) use of inhaled steroids: Secondary | ICD-10-CM | POA: Diagnosis not present

## 2022-09-11 DIAGNOSIS — R350 Frequency of micturition: Secondary | ICD-10-CM | POA: Diagnosis not present

## 2022-09-11 DIAGNOSIS — E1122 Type 2 diabetes mellitus with diabetic chronic kidney disease: Secondary | ICD-10-CM | POA: Diagnosis not present

## 2022-09-11 DIAGNOSIS — Z9181 History of falling: Secondary | ICD-10-CM | POA: Diagnosis not present

## 2022-09-13 ENCOUNTER — Encounter: Payer: Self-pay | Admitting: Family Medicine

## 2022-09-14 DIAGNOSIS — Z681 Body mass index (BMI) 19 or less, adult: Secondary | ICD-10-CM | POA: Diagnosis not present

## 2022-09-14 DIAGNOSIS — I10 Essential (primary) hypertension: Secondary | ICD-10-CM | POA: Diagnosis not present

## 2022-09-16 NOTE — Progress Notes (Signed)
Pharmacy Quality Measure Review  This patient is appearing on a report for being at risk of failing the adherence measure for diabetes medications this calendar year.   Medication: empagliflozin (Jardiance) 10 mg Last fill date: 06/13/22 for 30 day supply  Pt taking SGLT2i for CKD and DM. Last A1c 6.2% 04/28/22. Last Scr 2.02 (eGFR 32 mL/min).   Per chart review, pt stopped picking up medication in June as copay went up to $461.97 (from $47) per month - currently in donut hole. PCP office has been providing patient with month-long supply of samples since June. Pt is scheduled for telephone visit with PharmD Vanice Sarah 09/19/22 to address access. Spouse, Clydie Braun, helps with medications. If pt does not qualify for Medicare LIS, assess eligibility for Farxiga (dapagliflozin) PAP. Also having issue with cost of Breo Ellipta (fluticasone-vilanterol).  [facility] pharmacist will f/u.   Nils Pyle, PharmD PGY1 Pharmacy Resident

## 2022-09-18 ENCOUNTER — Encounter: Payer: Self-pay | Admitting: Physician Assistant

## 2022-09-18 ENCOUNTER — Encounter: Payer: Self-pay | Admitting: Cardiovascular Disease

## 2022-09-18 DIAGNOSIS — I10 Essential (primary) hypertension: Secondary | ICD-10-CM | POA: Diagnosis not present

## 2022-09-19 ENCOUNTER — Ambulatory Visit (INDEPENDENT_AMBULATORY_CARE_PROVIDER_SITE_OTHER): Payer: PPO

## 2022-09-19 ENCOUNTER — Telehealth: Payer: Self-pay | Admitting: Pharmacist

## 2022-09-19 ENCOUNTER — Other Ambulatory Visit: Payer: PPO | Admitting: Pharmacist

## 2022-09-19 DIAGNOSIS — M216X2 Other acquired deformities of left foot: Secondary | ICD-10-CM

## 2022-09-19 DIAGNOSIS — M2010 Hallux valgus (acquired), unspecified foot: Secondary | ICD-10-CM | POA: Diagnosis not present

## 2022-09-19 DIAGNOSIS — M216X1 Other acquired deformities of right foot: Secondary | ICD-10-CM

## 2022-09-19 DIAGNOSIS — E119 Type 2 diabetes mellitus without complications: Secondary | ICD-10-CM

## 2022-09-19 NOTE — Progress Notes (Signed)

## 2022-09-19 NOTE — Progress Notes (Signed)
09/19/2022 Name: [name] MRN: [id number] DOB: [birth year]  Chief Complaint  Patient presents with   Diabetes    [name] is a 89 y.o. year old male who presented for a telephone visit.   They were referred to the pharmacist by their PCP for assistance in managing medication access.    Subjective:  Care Team: Primary Care Provider: Donita Brooks, MD   Medication Access/Adherence  Current Pharmacy:  [phone] - [place], [place] - [address] AT [address] [facility] [facility] [address] [place] [address] Phone: [phone] Fax: [phone]  [facility] [address] [place] [address] Phone: [phone] Fax: [phone]   Patient reports affordability concerns with their medications: Yes  Patient reports access/transportation concerns to their pharmacy: No  Patient reports adherence concerns with their medications:  Yes  due to cost   Diabetes/HF/CKD:  Current medications: jardiance Medications tried in the past:  n/a  Current medication access support: none--would like to seek; does not qualify for lis/extra help  COPD:  Current medications: BreoEllipta Medications tried in the past:  n/a  Current medication access support: none   Objective:  Lab Results  Component Value Date   HGBA1C 6.2 (H) 04/28/2022    Lab Results  Component Value Date   CREATININE 2.02 (H) 07/20/2022   BUN 34 (H) 07/20/2022   NA 138 07/20/2022   K 4.3 07/20/2022   CL 101 07/20/2022   CO2 21 07/20/2022    Lab Results  Component Value Date   CHOL 110 04/28/2022   HDL 50 04/28/2022   LDLCALC 52 04/28/2022   TRIG 39 04/28/2022   CHOLHDL 2.2 04/28/2022    Medications Reviewed Today     Reviewed by Danella Maiers, [facility] (Pharmacist) on 09/19/22 at 1320  Med List Status: <None>   Medication Order Taking? Sig Documenting Provider Last Dose Status Informant  albuterol (PROAIR HFA)  108 (90 Base) MCG/ACT inhaler [id number] No INHALE 2 PUFFS INTO THE LUNGS EVERY 6 HOURS AS NEEDED FOR WHEEZING OR SHORTNESS OF BREATH  Patient taking differently: Inhale 2 puffs into the lungs every 6 (six) hours as needed for wheezing or shortness of breath. INHALE 2 PUFFS INTO THE LUNGS EVERY 6 HOURS AS NEEDED FOR WHEEZING OR SHORTNESS OF BREATH   Angiulli, Mcarthur Rossetti, PA-C Taking Active Multiple Informants  amiodarone (PACERONE) 200 MG tablet [id number] No Take 1 tablet (200 mg total) by mouth daily. Tonny Bollman, MD Taking Active   aspirin EC 81 MG tablet [id number] No Take 1 tablet (81 mg total) by mouth daily. Swallow whole. Almon Hercules, MD Taking Active Multiple Informants           Med Note (SATTERFIELD, Genoveva Ill   Sat May 05, 2022  7:11 PM)    atorvastatin (LIPITOR) 20 MG tablet [id number]  TAKE 1 TABLET BY MOUTH EVERY DAY Donita Brooks, MD  Active   doxazosin (CARDURA) 2 MG tablet [id number] No TAKE 1 TABLET BY MOUTH EVERY DAY Donita Brooks, MD Taking Active   empagliflozin (JARDIANCE) 10 MG TABS tablet [id number] No TAKE 1 TABLET BY MOUTH DAILY BEFORE BREAKFAST. Donita Brooks, MD Taking Active   escitalopram (LEXAPRO) 10 MG tablet [id number] No TAKE 1 TABLET BY MOUTH EVERY DAY Donita Brooks, MD Taking Active   ezetimibe (ZETIA) 10 MG tablet [id number] No TAKE 1 TABLET BY MOUTH EVERY DAY Tonny Bollman, MD Taking Active   fluticasone furoate-vilanterol (BREO  ELLIPTA) 200-25 MCG/ACT AEPB [id number] No INHALE 1 PUFF DAILY *NEED APPT*  Patient taking differently: Inhale 1 puff into the lungs daily. INHALE 1 PUFF DAILY *NEED APPT*   Pickard, Priscille Heidelberg, MD Taking Active Multiple Informants  furosemide (LASIX) 40 MG tablet [id number] No Take 1 tablet (40 mg total) by mouth every other day.  Patient taking differently: Take 40 mg by mouth as needed for edema or fluid.   Tonny Bollman, MD Taking Active Multiple Informants           Med Note (SATTERFIELD, Genoveva Ill   Sat May 05, 2022   7:08 PM)    loratadine (CLARITIN) 10 MG tablet [id number] No Take 10 mg by mouth daily as needed for allergies or rhinitis.  [provider] Taking Active Multiple Informants  meclizine (ANTIVERT) 12.5 MG tablet [id number] No Take 1 tablet (12.5 mg total) by mouth 3 (three) times daily as needed for dizziness. Cathren Laine, MD Taking Active Multiple Informants  memantine [facility]) 5 MG tablet [id number] No TAKE 1 TABLET BY MOUTH EVERYDAY AT BEDTIME Marcos Eke, PA-C Taking Active   Multiple Vitamin (MULTIVITAMIN WITH MINERALS) TABS tablet [id number] No Take 1 tablet by mouth daily. [provider] Taking Active Multiple Informants             Assessment/Plan:   Applications to be mailed to patient for medication assistance (Jardiance & Virgel Bouquet) ; unsure if patient will qualify, but we will try to pursue     Follow Up Plan: as needed  Kieth Brightly, PharmD, BCACP Clinical Pharmacist, [facility]

## 2022-09-19 NOTE — Telephone Encounter (Signed)
Please prepare PAPs for Jardiance 10mg  every morning and Breo Ellipta Send patient portion to home address Fax PCP portion to PCP office Patient's wife is DPR and uses my chart I made her aware of all documents needed and that these companies can be difficult to approv  Thank you ALWAYS!! Raynelle Fanning

## 2022-09-21 ENCOUNTER — Telehealth: Payer: Self-pay

## 2022-09-21 NOTE — Telephone Encounter (Signed)
Jardiance sample x 1 box. Lot# [id number] Exp:04/2024. Mjp,lpn

## 2022-09-24 NOTE — Telephone Encounter (Signed)
Mailing applications to patients home.

## 2022-09-24 NOTE — Telephone Encounter (Signed)
I would continue the amlodipine 5 mg daily along with his other medications. I think the BP will settle out in a good range after a few weeks of being on this.

## 2022-10-08 DIAGNOSIS — G309 Alzheimer's disease, unspecified: Secondary | ICD-10-CM | POA: Diagnosis not present

## 2022-10-08 DIAGNOSIS — I4891 Unspecified atrial fibrillation: Secondary | ICD-10-CM | POA: Diagnosis not present

## 2022-10-08 DIAGNOSIS — E119 Type 2 diabetes mellitus without complications: Secondary | ICD-10-CM | POA: Diagnosis not present

## 2022-10-08 DIAGNOSIS — I5030 Unspecified diastolic (congestive) heart failure: Secondary | ICD-10-CM | POA: Diagnosis not present

## 2022-10-08 DIAGNOSIS — J449 Chronic obstructive pulmonary disease, unspecified: Secondary | ICD-10-CM | POA: Diagnosis not present

## 2022-10-08 DIAGNOSIS — S51812D Laceration without foreign body of left forearm, subsequent encounter: Secondary | ICD-10-CM | POA: Diagnosis not present

## 2022-10-08 DIAGNOSIS — F0283 Dementia in other diseases classified elsewhere, unspecified severity, with mood disturbance: Secondary | ICD-10-CM | POA: Diagnosis not present

## 2022-10-11 ENCOUNTER — Encounter (HOSPITAL_BASED_OUTPATIENT_CLINIC_OR_DEPARTMENT_OTHER): Payer: Self-pay | Admitting: Pediatrics

## 2022-10-11 ENCOUNTER — Emergency Department (HOSPITAL_BASED_OUTPATIENT_CLINIC_OR_DEPARTMENT_OTHER): Payer: PPO | Admitting: Radiology

## 2022-10-11 ENCOUNTER — Telehealth: Payer: Self-pay | Admitting: Family Medicine

## 2022-10-11 ENCOUNTER — Other Ambulatory Visit (HOSPITAL_BASED_OUTPATIENT_CLINIC_OR_DEPARTMENT_OTHER): Payer: Self-pay

## 2022-10-11 ENCOUNTER — Emergency Department (HOSPITAL_BASED_OUTPATIENT_CLINIC_OR_DEPARTMENT_OTHER): Payer: PPO

## 2022-10-11 ENCOUNTER — Ambulatory Visit: Payer: Self-pay

## 2022-10-11 ENCOUNTER — Emergency Department (HOSPITAL_BASED_OUTPATIENT_CLINIC_OR_DEPARTMENT_OTHER)
Admission: EM | Admit: 2022-10-11 | Discharge: 2022-10-11 | Disposition: A | Discharge status: Home / Self Care | Payer: PPO | Attending: Emergency Medicine | Admitting: Emergency Medicine

## 2022-10-11 ENCOUNTER — Other Ambulatory Visit: Payer: Self-pay

## 2022-10-11 DIAGNOSIS — J0130 Acute sphenoidal sinusitis, unspecified: Secondary | ICD-10-CM | POA: Insufficient documentation

## 2022-10-11 DIAGNOSIS — Z7982 Long term (current) use of aspirin: Secondary | ICD-10-CM | POA: Diagnosis not present

## 2022-10-11 DIAGNOSIS — Z20822 Contact with and (suspected) exposure to covid-19: Secondary | ICD-10-CM | POA: Insufficient documentation

## 2022-10-11 DIAGNOSIS — Z23 Encounter for immunization: Secondary | ICD-10-CM | POA: Diagnosis not present

## 2022-10-11 DIAGNOSIS — S20229A Contusion of unspecified back wall of thorax, initial encounter: Secondary | ICD-10-CM | POA: Insufficient documentation

## 2022-10-11 DIAGNOSIS — R296 Repeated falls: Secondary | ICD-10-CM | POA: Insufficient documentation

## 2022-10-11 DIAGNOSIS — R1032 Left lower quadrant pain: Secondary | ICD-10-CM | POA: Diagnosis not present

## 2022-10-11 DIAGNOSIS — R42 Dizziness and giddiness: Secondary | ICD-10-CM

## 2022-10-11 DIAGNOSIS — T07XXXA Unspecified multiple injuries, initial encounter: Secondary | ICD-10-CM

## 2022-10-11 DIAGNOSIS — I714 Abdominal aortic aneurysm, without rupture, unspecified: Secondary | ICD-10-CM | POA: Diagnosis not present

## 2022-10-11 DIAGNOSIS — S4992XA Unspecified injury of left shoulder and upper arm, initial encounter: Secondary | ICD-10-CM | POA: Diagnosis present

## 2022-10-11 DIAGNOSIS — S40811A Abrasion of right upper arm, initial encounter: Secondary | ICD-10-CM | POA: Insufficient documentation

## 2022-10-11 DIAGNOSIS — W01198A Fall on same level from slipping, tripping and stumbling with subsequent striking against other object, initial encounter: Secondary | ICD-10-CM | POA: Insufficient documentation

## 2022-10-11 DIAGNOSIS — F0390 Unspecified dementia without behavioral disturbance: Secondary | ICD-10-CM | POA: Insufficient documentation

## 2022-10-11 DIAGNOSIS — I517 Cardiomegaly: Secondary | ICD-10-CM | POA: Diagnosis not present

## 2022-10-11 DIAGNOSIS — I951 Orthostatic hypotension: Secondary | ICD-10-CM | POA: Diagnosis not present

## 2022-10-11 DIAGNOSIS — Z7951 Long term (current) use of inhaled steroids: Secondary | ICD-10-CM | POA: Diagnosis not present

## 2022-10-11 DIAGNOSIS — S3991XA Unspecified injury of abdomen, initial encounter: Secondary | ICD-10-CM | POA: Diagnosis not present

## 2022-10-11 DIAGNOSIS — M25511 Pain in right shoulder: Secondary | ICD-10-CM | POA: Diagnosis not present

## 2022-10-11 DIAGNOSIS — M25512 Pain in left shoulder: Secondary | ICD-10-CM | POA: Diagnosis not present

## 2022-10-11 DIAGNOSIS — I2510 Atherosclerotic heart disease of native coronary artery without angina pectoris: Secondary | ICD-10-CM | POA: Insufficient documentation

## 2022-10-11 DIAGNOSIS — S40022A Contusion of left upper arm, initial encounter: Secondary | ICD-10-CM | POA: Insufficient documentation

## 2022-10-11 DIAGNOSIS — E119 Type 2 diabetes mellitus without complications: Secondary | ICD-10-CM | POA: Diagnosis not present

## 2022-10-11 DIAGNOSIS — S0001XA Abrasion of scalp, initial encounter: Secondary | ICD-10-CM | POA: Diagnosis not present

## 2022-10-11 DIAGNOSIS — S199XXA Unspecified injury of neck, initial encounter: Secondary | ICD-10-CM | POA: Diagnosis not present

## 2022-10-11 DIAGNOSIS — S299XXA Unspecified injury of thorax, initial encounter: Secondary | ICD-10-CM | POA: Insufficient documentation

## 2022-10-11 DIAGNOSIS — S0990XA Unspecified injury of head, initial encounter: Secondary | ICD-10-CM | POA: Diagnosis not present

## 2022-10-11 DIAGNOSIS — J45909 Unspecified asthma, uncomplicated: Secondary | ICD-10-CM | POA: Insufficient documentation

## 2022-10-11 DIAGNOSIS — Z043 Encounter for examination and observation following other accident: Secondary | ICD-10-CM | POA: Diagnosis not present

## 2022-10-11 DIAGNOSIS — N189 Chronic kidney disease, unspecified: Secondary | ICD-10-CM | POA: Insufficient documentation

## 2022-10-11 DIAGNOSIS — Z7984 Long term (current) use of oral hypoglycemic drugs: Secondary | ICD-10-CM | POA: Insufficient documentation

## 2022-10-11 DIAGNOSIS — R0781 Pleurodynia: Secondary | ICD-10-CM | POA: Diagnosis not present

## 2022-10-11 DIAGNOSIS — R918 Other nonspecific abnormal finding of lung field: Secondary | ICD-10-CM | POA: Diagnosis not present

## 2022-10-11 DIAGNOSIS — N209 Urinary calculus, unspecified: Secondary | ICD-10-CM | POA: Diagnosis not present

## 2022-10-11 DIAGNOSIS — I129 Hypertensive chronic kidney disease with stage 1 through stage 4 chronic kidney disease, or unspecified chronic kidney disease: Secondary | ICD-10-CM | POA: Diagnosis not present

## 2022-10-11 DIAGNOSIS — M47816 Spondylosis without myelopathy or radiculopathy, lumbar region: Secondary | ICD-10-CM | POA: Diagnosis not present

## 2022-10-11 DIAGNOSIS — M19011 Primary osteoarthritis, right shoulder: Secondary | ICD-10-CM | POA: Diagnosis not present

## 2022-10-11 DIAGNOSIS — K5730 Diverticulosis of large intestine without perforation or abscess without bleeding: Secondary | ICD-10-CM | POA: Diagnosis not present

## 2022-10-11 LAB — CBC
HCT: 33.1 % — ABNORMAL LOW (ref 39.0–52.0)
Hemoglobin: 10.8 g/dL — ABNORMAL LOW (ref 13.0–17.0)
MCH: 29.6 pg (ref 26.0–34.0)
MCHC: 32.6 g/dL (ref 30.0–36.0)
MCV: 90.7 fL (ref 80.0–100.0)
Platelets: 167 10*3/uL (ref 150–400)
RBC: 3.65 MIL/uL — ABNORMAL LOW (ref 4.22–5.81)
RDW: 15.1 % (ref 11.5–15.5)
WBC: 4.3 10*3/uL (ref 4.0–10.5)
nRBC: 0 % (ref 0.0–0.2)

## 2022-10-11 LAB — BASIC METABOLIC PANEL
Anion gap: 7 (ref 5–15)
BUN: 38 mg/dL — ABNORMAL HIGH (ref 8–23)
CO2: 26 mmol/L (ref 22–32)
Calcium: 8.4 mg/dL — ABNORMAL LOW (ref 8.9–10.3)
Chloride: 106 mmol/L (ref 98–111)
Creatinine, Ser: 2.26 mg/dL — ABNORMAL HIGH (ref 0.61–1.24)
GFR, Estimated: 27 mL/min — ABNORMAL LOW (ref >60)
Glucose, Bld: 119 mg/dL — ABNORMAL HIGH (ref 70–99)
Potassium: 4.1 mmol/L (ref 3.5–5.1)
Sodium: 139 mmol/L (ref 135–145)

## 2022-10-11 LAB — RESP PANEL BY RT-PCR (RSV, FLU A&B, COVID)  RVPGX2
Influenza A by PCR: NEGATIVE
Influenza B by PCR: NEGATIVE
Resp Syncytial Virus by PCR: NEGATIVE
SARS Coronavirus 2 by RT PCR: NEGATIVE

## 2022-10-11 LAB — URINALYSIS, ROUTINE W REFLEX MICROSCOPIC
Bacteria, UA: NONE SEEN
Bilirubin Urine: NEGATIVE
Glucose, UA: >1000 mg/dL — AB
Ketones, ur: NEGATIVE mg/dL
Leukocytes,Ua: NEGATIVE
Nitrite: NEGATIVE
Protein, ur: 100 mg/dL — AB
Specific Gravity, Urine: 1.018 (ref 1.005–1.030)
pH: 5.5 (ref 5.0–8.0)

## 2022-10-11 LAB — TROPONIN I (HIGH SENSITIVITY)
Troponin I (High Sensitivity): 34 ng/L — ABNORMAL HIGH
Troponin I (High Sensitivity): 33 ng/L — ABNORMAL HIGH

## 2022-10-11 MED ORDER — AMOXICILLIN-POT CLAVULANATE 875-125 MG PO TABS
1.0000 | ORAL_TABLET | Freq: Two times a day (BID) | ORAL | 0 refills | Status: AC
Start: 2022-10-11 — End: 2022-10-22
  Filled 2022-10-11: qty 19, 10d supply, fill #0

## 2022-10-11 MED ORDER — TETANUS-DIPHTH-ACELL PERTUSSIS 5-2.5-18.5 LF-MCG/0.5 IM SUSY
0.5000 mL | PREFILLED_SYRINGE | Freq: Once | INTRAMUSCULAR | Status: AC
  Administered 2022-10-11: 0.5 mL via INTRAMUSCULAR
  Filled 2022-10-11: qty 0.5

## 2022-10-11 MED ORDER — LACTATED RINGERS IV BOLUS
500.0000 mL | Freq: Once | INTRAVENOUS | Status: AC
  Administered 2022-10-11: 500 mL via INTRAVENOUS

## 2022-10-11 MED ORDER — AMOXICILLIN-POT CLAVULANATE 875-125 MG PO TABS
1.0000 | ORAL_TABLET | Freq: Once | ORAL | Status: AC
  Administered 2022-10-11: 1 via ORAL
  Filled 2022-10-11: qty 1

## 2022-10-11 MED ORDER — LACTATED RINGERS IV BOLUS: 1000.0000 mL | Freq: Once | INTRAVENOUS | Status: DC

## 2022-10-11 NOTE — Discharge Instructions (Signed)
Thank you for letting us take care of you today.  You were found to have orthostatic hypotension which is likely causing your falls. We gave you fluids and this resolved. Please be sure to stay well hydrated at home.   This can also be a side effect of certain medications. Discuss with your PCP if you should adjust any of your medications. I recommend measuring your blood pressure before taking your nightly amlodipine and holding it if your blood pressure is normal.   We did not see any injuries on the scans related to your fall. Your tetanus shot was updated. There was an incidental finding of a questionable sinus infection and early pneumonia. With your dizziness and occasional cough, we will go ahead and treat this. Please take the medication as prescribed.  When you change positions, do so slowly and if you become dizzy allow yourself to adjust before changing positions again or lie back down. Use your walker to help with getting around at home. Follow up with your PCP as soon as possible. For new or worsening symptoms, return to nearest ED for re-evaluation.

## 2022-10-11 NOTE — ED Triage Notes (Signed)
Wife at bedside who reports the fall incident x2, one on  Saturday morning and the next was Monday; reports he was dizzy prior to the fall episodes. Patient c/o left arm rib pain, -LOC.

## 2022-10-11 NOTE — ED Notes (Signed)
PT stated has dizziness after having covid vaccine

## 2022-10-11 NOTE — Telephone Encounter (Signed)
Chief Complaint: Multiple falls Symptoms: Ribcage pain, open cuts and bruises, dizzy and weak  Frequency: 2 episodes of falls one Saturday and the other Monday  Pertinent Negatives: Patient denies chest pain, SOB Disposition: [x] ED /[] Urgent Care (no appt availability in office) / [] Appointment(In office/virtual)/ []  [facility]/ [] Home Care/ [] Refused Recommended Disposition /[] [facility]/ []  Follow-up with PCP Additional Notes: Patient's wife called (signed DPR).  Clydie Braun stated that the patient has fallen 2 times once Saturday and once Monday. Patient fell face first to the ground and has open cuts and bruises to the forehead, bilateral elbows, left wrist and ribcage. Patient was evaluated by EMS but was not transported to the ED at the time. Patient reports ribcage pain 6/10 currently and feels very dizzy and weaker than usual. Care advice was given and it was advised that patient go to ED at this time. Patient is agreeable to go to [facility] only at this time. Advised to callback with any further questions, comments or concerns. Reason for Disposition  [1] MODERATE weakness (i.e., interferes with work, school, normal activities) AND [2] new-onset or worsening  Answer Assessment - Initial Assessment Questions 1. MECHANISM: "How did the fall happen?"     2 falls since Friday 2. DOMESTIC VIOLENCE AND ELDER ABUSE SCREENING: "Did you fall because someone pushed you or tried to hurt you?" If Yes, ask: "Are you safe now?"     No  3. ONSET: "When did the fall happen?" (e.g., minutes, hours, or days ago)     Saturday and Monday  4. LOCATION: "What part of the body hit the ground?" (e.g., back, buttocks, head, hips, knees, hands, head, stomach)     Head and back  5. INJURY: "Did you hurt (injure) yourself when you fell?" If Yes, ask: "What did you injure? Tell me more about this?" (e.g., body area; type of injury; pain severity)"     Ribcage pain and head wound, finger bruise   6. PAIN: "Is there any pain?" If Yes, ask: "How bad is the pain?" (e.g., Scale 1-10; or mild,  moderate, severe)   - NONE (0): No pain   - MILD (1-3): Doesn't interfere with normal activities    - MODERATE (4-7): Interferes with normal activities or awakens from sleep    - SEVERE (8-10): Excruciating pain, unable to do any normal activities      6/10  7. SIZE: For cuts, bruises, or swelling, ask: "How large is it?" (e.g., inches or centimeters)      Bruise middle finger left hand, open cuts with scabs forehead, elbows and left wrist       9. OTHER SYMPTOMS: "Do you have any other symptoms?" (e.g., dizziness, fever, weakness; new onset or worsening).      Dizzy, unsteady  10. CAUSE: "What do you think caused the fall (or falling)?" (e.g., tripped, dizzy spell)       Unsteady, dizziness  Protocols used: Falls and [facility]

## 2022-10-11 NOTE — ED Provider Notes (Signed)
[place] [facility]GENC[facility] DEPARTMENT AT [facility] Provider Note   CSN: [id number] Arrival date & time: 10/11/22  1343     History  Chief Complaint  Patient presents with   Fall   Dizziness   Chest Pain    [name] is a 89 y.o. male with PMH HLD, HTN, CAD, asthma, CKD, T2DM, dementia, who presents to ED with wife for multiple falls. Wife reports pt received COVID vaccination 6 days ago. The following day he had 2 episodes where he became dizzy and then fell hitting his head and landing with one arm awkwardly and the other twisted underneath him. Pt then had another fall 2 days later after similar dizzy spell. Pt states he becomes dizzy only with changing positions and walking. He c/o pain to his left rib cage and left shoulder. Pt/wife deny LOC from injuries. Pt has been eating and drinking well. No recent nausea, vomiting, diarrhea, cough, congestion, or fever. Pt denies chest pain or shortness of breath preceding falls or currently. Last tetanus unknown. Wounds cleansed by EMS after first falls. Last EF 55 to 60%.        Home Medications Prior to Admission medications   Medication Sig Start Date End Date Taking? Authorizing Provider  amoxicillin-clavulanate (AUGMENTIN) 875-125 MG tablet Take 1 tablet by mouth every 12 (twelve) hours for 10 days. 10/11/22 10/21/22 Yes Zyniah Ferraiolo L, PA-C  albuterol (PROAIR HFA) 108 (90 Base) MCG/ACT inhaler INHALE 2 PUFFS INTO THE LUNGS EVERY 6 HOURS AS NEEDED FOR WHEEZING OR SHORTNESS OF BREATH Patient taking differently: Inhale 2 puffs into the lungs every 6 (six) hours as needed for wheezing or shortness of breath. INHALE 2 PUFFS INTO THE LUNGS EVERY 6 HOURS AS NEEDED FOR WHEEZING OR SHORTNESS OF BREATH 08/06/19   Angiulli, Mcarthur Rossetti, PA-C  amiodarone (PACERONE) 200 MG tablet Take 1 tablet (200 mg total) by mouth daily. 05/24/22   Tonny Bollman, MD  aspirin EC 81 MG tablet Take 1 tablet (81 mg total) by mouth daily. Swallow whole.  05/03/22   Almon Hercules, MD  atorvastatin (LIPITOR) 20 MG tablet TAKE 1 TABLET BY MOUTH EVERY DAY 09/10/22   Donita Brooks, MD  doxazosin (CARDURA) 2 MG tablet TAKE 1 TABLET BY MOUTH EVERY DAY 07/23/22   Donita Brooks, MD  empagliflozin (JARDIANCE) 10 MG TABS tablet TAKE 1 TABLET BY MOUTH DAILY BEFORE BREAKFAST. 07/16/22   Donita Brooks, MD  escitalopram (LEXAPRO) 10 MG tablet TAKE 1 TABLET BY MOUTH EVERY DAY 07/23/22   Donita Brooks, MD  ezetimibe (ZETIA) 10 MG tablet TAKE 1 TABLET BY MOUTH EVERY DAY 05/23/22   Tonny Bollman, MD  fluticasone furoate-vilanterol (BREO ELLIPTA) 200-25 MCG/ACT AEPB INHALE 1 PUFF DAILY *NEED APPT* Patient taking differently: Inhale 1 puff into the lungs daily. INHALE 1 PUFF DAILY *NEED APPT* 01/16/22   Donita Brooks, MD  furosemide (LASIX) 40 MG tablet Take 1 tablet (40 mg total) by mouth every other day. Patient taking differently: Take 40 mg by mouth as needed for edema or fluid. 12/25/21   Tonny Bollman, MD  loratadine (CLARITIN) 10 MG tablet Take 10 mg by mouth daily as needed for allergies or rhinitis.     [provider]  meclizine (ANTIVERT) 12.5 MG tablet Take 1 tablet (12.5 mg total) by mouth 3 (three) times daily as needed for dizziness. 09/28/20   Cathren Laine, MD  memantine (NAMENDA) 5 MG tablet TAKE 1 TABLET BY MOUTH EVERYDAY AT BEDTIME 05/11/22  Marcos Eke, PA-C  Multiple Vitamin (MULTIVITAMIN WITH MINERALS) TABS tablet Take 1 tablet by mouth daily.    [provider]      Allergies    Gabapentin    Review of Systems   Review of Systems  All other systems reviewed and are negative.   Physical Exam Updated Vital Signs BP 122/75   Pulse 60   Temp 97.8 F (36.6 C) (Oral)   Resp 16   Ht 5\' 6"  (1.676 m)   Wt 86.1 kg   SpO2 99%   BMI 30.64 kg/m  Physical Exam Vitals and nursing note reviewed.  Constitutional:      General: He is not in acute distress.    Appearance: He is not ill-appearing,  toxic-appearing or diaphoretic.  HENT:     Head: Normocephalic.     Comments: Superficial abrasion with dried blood overlying to mid-frontal scalp, no battle sign, no raccoon eyes    Right Ear: Tympanic membrane, ear canal and external ear normal. No hemotympanum.     Left Ear: Tympanic membrane, ear canal and external ear normal. No hemotympanum.  Eyes:     Extraocular Movements: Extraocular movements intact.     Pupils: Pupils are equal, round, and reactive to light.  Neck:     Comments: No midline tenderness or deformities Cardiovascular:     Rate and Rhythm: Normal rate and regular rhythm.  Pulmonary:     Effort: Pulmonary effort is normal.     Breath sounds: Normal breath sounds. No decreased breath sounds, wheezing, rhonchi or rales.  Chest:     Chest wall: No deformity, tenderness, crepitus or edema.  Abdominal:     Palpations: Abdomen is soft. There is no mass.     Tenderness: There is no abdominal tenderness. There is no guarding or rebound.  Musculoskeletal:     Cervical back: Normal range of motion and neck supple.     Right lower leg: Edema present.     Left lower leg: Edema present.     Comments: Large area of ecchymosis to left proximal upper extremity and shoulder, multiple scattered abrasions of upper extremities, all extremities palpated without significant tenderness or obvious deformity and with grossly intact range of motion, NVID x 4, area of ecchymosis over mid-back, no midline T/L spinal tenderness, stepoffs, or deformities  Skin:    General: Skin is warm and dry.     Capillary Refill: Capillary refill takes less than 2 seconds.  Neurological:     General: No focal deficit present.     Cranial Nerves: Cranial nerves 2-12 are intact.     Sensory: Sensation is intact.     Motor: Motor function is intact.     Comments: Awake, alert, answering questions appropriately  Psychiatric:        Speech: Speech normal.        Behavior: Behavior normal. Behavior is  cooperative.     ED Results / Procedures / Treatments   Labs (all labs ordered are listed, but only abnormal results are displayed) Labs Reviewed  BASIC METABOLIC PANEL - Abnormal; Notable for the following components:      Result Value   Glucose, Bld 119 (*)    BUN 38 (*)    Creatinine, Ser 2.26 (*)    Calcium 8.4 (*)    GFR, Estimated 27 (*)    All other components within normal limits  CBC - Abnormal; Notable for the following components:   RBC 3.65 (*)  Hemoglobin 10.8 (*)    HCT 33.1 (*)    All other components within normal limits  URINALYSIS, ROUTINE W REFLEX MICROSCOPIC - Abnormal; Notable for the following components:   Glucose, UA >1,000 (*)    Hgb urine dipstick SMALL (*)    Protein, ur 100 (*)    All other components within normal limits  TROPONIN I (HIGH SENSITIVITY) - Abnormal; Notable for the following components:   Troponin I (High Sensitivity) 34 (*)    All other components within normal limits  TROPONIN I (HIGH SENSITIVITY) - Abnormal; Notable for the following components:   Troponin I (High Sensitivity) 33 (*)    All other components within normal limits  RESP PANEL BY RT-PCR (RSV, FLU A&B, COVID)  [id number]    EKG EKG Interpretation Date/Time:  Thursday October 11 2022 14:02:26 EDT Ventricular Rate:  63 PR Interval:  173 QRS Duration:  128 QT Interval:  470 QTC Calculation: 482 R Axis:   -45  Text Interpretation: Sinus rhythm Nonspecific IVCD with LAD No significant change since last tracing Confirmed by Gwyneth Sprout ([id number]) on 10/11/2022 3:29:56 PM  Radiology CT L-SPINE NO CHARGE  Result Date: 10/11/2022 CLINICAL DATA:  Recent falls, dizziness, left-sided pain EXAM: CT LUMBAR SPINE WITHOUT CONTRAST TECHNIQUE: Multidetector CT imaging of the lumbar spine was performed without intravenous contrast administration. Multiplanar CT image reconstructions were also generated. RADIATION DOSE REDUCTION: This exam was performed according to the  departmental dose-optimization program which includes automated exposure control, adjustment of the mA and/or kV according to patient size and/or use of iterative reconstruction technique. COMPARISON:  04/27/2022 and previous FINDINGS: Segmentation: 5 non rib-bearing lumbar segments assigned L1-L5. Alignment: Normal Vertebrae: Chronic mild L3 vertebral compression deformity. No acute fracture. Paraspinal and other soft tissues: No paraspinal hematoma or other acute findings. Disc levels: Bridging anterior osteophytes through the visualized lower thoracic spine. Small anterior endplate spurs at all lumbar levels. Mild facet DJD L3-4 left greater than right. IMPRESSION: 1. No acute findings. 2. Chronic mild L3 vertebral compression deformity. Electronically Signed   By: Corlis Leak M.D.   On: 10/11/2022 18:19   CT CHEST ABDOMEN PELVIS WO CONTRAST  Result Date: 10/11/2022 CLINICAL DATA:  Larey Seat, left-sided pain EXAM: CT CHEST, ABDOMEN AND PELVIS WITHOUT CONTRAST TECHNIQUE: Multidetector CT imaging of the chest, abdomen and pelvis was performed following the standard protocol without IV contrast. RADIATION DOSE REDUCTION: This exam was performed according to the departmental dose-optimization program which includes automated exposure control, adjustment of the mA and/or kV according to patient size and/or use of iterative reconstruction technique. COMPARISON:  04/27/2022 and previous FINDINGS: CT CHEST FINDINGS Cardiovascular: Heart size upper limits normal. Trace pericardial fluid. Coronary and aortic calcifications without aneurysm. Mediastinum/Nodes: No mediastinal hematoma, mass, or adenopathy. Lungs/Pleura: Loculated effusion at the left lung base stable. Stable scarring in the inferolateral lingula. Some increase in peripheral interstitial opacities at the right lung base. Musculoskeletal: Vertebral endplate spurring at multiple levels in the mid and lower thoracic spine. No acute findings. CT ABDOMEN PELVIS  FINDINGS Hepatobiliary: No focal liver abnormality is seen. No gallstones, gallbladder wall thickening, or biliary dilatation. Pancreas: 2.6 cm fluid attenuation process adjacent to the pancreatic tail, stable since PET-CT 01/13/2021. No ductal dilatation or regional inflammatory change. Spleen: Normal in size without focal abnormality. Adrenals/Urinary Tract: No adrenal mass. Bilateral urolithiasis, largest stone 2 mm in the left lower pole. No hydronephrosis. Mild renal parenchymal atrophy left greater than right. Urinary bladder incompletely distended. Stomach/Bowel: Stomach is nondistended, unremarkable.  Small bowel nondilated. Appendix not visualized. Coarse calcifications in the right lower quadrant mesentery peripherally, stable since 02/02/2021. The colon is partially distended, with multiple sigmoid diverticula; no adjacent inflammatory change. Vascular/Lymphatic: Moderate aortoiliac calcified atheromatous plaque with stable fusiform 3 cm infrarenal aneurysm terminating above the bifurcation. No abdominal or pelvic adenopathy. Reproductive: Prostate enlargement with central coarse calcification. Other: No ascites.  No free air. Musculoskeletal: Chronic L3 vertebral compression deformity. Normal alignment. Bilateral hip DJD. IMPRESSION: 1. No acute findings. 2. Stable loculated left pleural effusion. 3. Chronic 2.6 cm fluid attenuation process adjacent to the pancreatic tail. 4. Bilateral urolithiasis without hydronephrosis. 5. Sigmoid diverticulosis. 6. Some increase in peripheral interstitial opacities at the right lung base. 7. Stable 3 cm AAA. Recommend follow-up ultrasound every 3 years. (Ref.: Chaikof et Karsten Ro Vasc Surg 2018 Jan;67(1):2-77.e2.) Electronically Signed   By: Corlis Leak M.D.   On: 10/11/2022 18:16   CT Head Wo Contrast  Result Date: 10/11/2022 CLINICAL DATA:  Head trauma, minor (Age >= 65y); Neck trauma (Age >= 65y) EXAM: CT HEAD WITHOUT CONTRAST CT CERVICAL SPINE WITHOUT CONTRAST  TECHNIQUE: Multidetector CT imaging of the head and cervical spine was performed following the standard protocol without intravenous contrast. Multiplanar CT image reconstructions of the cervical spine were also generated. RADIATION DOSE REDUCTION: This exam was performed according to the departmental dose-optimization program which includes automated exposure control, adjustment of the mA and/or kV according to patient size and/or use of iterative reconstruction technique. COMPARISON:  CT head and C Spine 09/28/20 FINDINGS: CT HEAD FINDINGS Brain: No evidence of acute infarction, hemorrhage, hydrocephalus, extra-axial collection or mass lesion/mass effect. Vascular: No hyperdense vessel or unexpected calcification. Skull: Normal. Negative for fracture or focal lesion. Sinuses/Orbits: No middle ear or mastoid effusion. Frothy secretions in the right sphenoid sinus with an air-fluid level. Bilateral lens replacement. Orbits are otherwise unremarkable. Other: None. CT CERVICAL SPINE FINDINGS Limitations: Motion degraded exam Alignment: Trace stepwise anterolisthesis of C3 on C4, C4 on C5, and C5 on C6. Skull base and vertebrae: No acute fracture. No primary bone lesion or focal pathologic process. Unchanged mild superior endplate height loss at C7 compared to 2022. Soft tissues and spinal canal: No prevertebral fluid or swelling. No visible canal hematoma. Disc levels:  No evidence of high-grade spinal canal stenosis Upper chest: Negative. Other: Negative IMPRESSION: 1. No acute intracranial abnormality. 2. No acute fracture or traumatic malalignment of the cervical spine. 3. Frothy secretions in the right sphenoid sinus with an air-fluid level. Correlate for acute sinusitis. Electronically Signed   By: Lorenza Cambridge M.D.   On: 10/11/2022 17:38   CT Cervical Spine Wo Contrast  Result Date: 10/11/2022 CLINICAL DATA:  Head trauma, minor (Age >= 65y); Neck trauma (Age >= 65y) EXAM: CT HEAD WITHOUT CONTRAST CT CERVICAL  SPINE WITHOUT CONTRAST TECHNIQUE: Multidetector CT imaging of the head and cervical spine was performed following the standard protocol without intravenous contrast. Multiplanar CT image reconstructions of the cervical spine were also generated. RADIATION DOSE REDUCTION: This exam was performed according to the departmental dose-optimization program which includes automated exposure control, adjustment of the mA and/or kV according to patient size and/or use of iterative reconstruction technique. COMPARISON:  CT head and C Spine 09/28/20 FINDINGS: CT HEAD FINDINGS Brain: No evidence of acute infarction, hemorrhage, hydrocephalus, extra-axial collection or mass lesion/mass effect. Vascular: No hyperdense vessel or unexpected calcification. Skull: Normal. Negative for fracture or focal lesion. Sinuses/Orbits: No middle ear or mastoid effusion. Frothy secretions in the right  sphenoid sinus with an air-fluid level. Bilateral lens replacement. Orbits are otherwise unremarkable. Other: None. CT CERVICAL SPINE FINDINGS Limitations: Motion degraded exam Alignment: Trace stepwise anterolisthesis of C3 on C4, C4 on C5, and C5 on C6. Skull base and vertebrae: No acute fracture. No primary bone lesion or focal pathologic process. Unchanged mild superior endplate height loss at C7 compared to 2022. Soft tissues and spinal canal: No prevertebral fluid or swelling. No visible canal hematoma. Disc levels:  No evidence of high-grade spinal canal stenosis Upper chest: Negative. Other: Negative IMPRESSION: 1. No acute intracranial abnormality. 2. No acute fracture or traumatic malalignment of the cervical spine. 3. Frothy secretions in the right sphenoid sinus with an air-fluid level. Correlate for acute sinusitis. Electronically Signed   By: Lorenza Cambridge M.D.   On: 10/11/2022 17:38   DG Shoulder Right Portable  Result Date: 10/11/2022 CLINICAL DATA:  Right shoulder pain after multiple falls yesterday. EXAM: RIGHT SHOULDER - 1 VIEW  COMPARISON:  December 24, 2019. FINDINGS: There is no evidence of fracture or dislocation. Moderate degenerative changes seen involving the right acromioclavicular and glenohumeral joints. Soft tissues are unremarkable. IMPRESSION: Moderate degenerative joint disease as noted above. No acute abnormality seen. Electronically Signed   By: Lupita Raider M.D.   On: 10/11/2022 15:48   DG Shoulder Left Portable  Result Date: 10/11/2022 CLINICAL DATA:  Left shoulder pain after multiple falls yesterday. EXAM: LEFT SHOULDER COMPARISON:  September 24, 2020. FINDINGS: There is no evidence of fracture or dislocation. There is no evidence of arthropathy or other focal bone abnormality. Soft tissues are unremarkable. IMPRESSION: Negative. Electronically Signed   By: Lupita Raider M.D.   On: 10/11/2022 15:47   DG Pelvis Portable  Result Date: 10/11/2022 CLINICAL DATA:  Multiple falls. EXAM: PORTABLE PELVIS 1-2 VIEWS COMPARISON:  July 14, 2019. FINDINGS: There is no evidence of pelvic fracture or diastasis. No pelvic bone lesions are seen. IMPRESSION: Negative. Electronically Signed   By: Lupita Raider M.D.   On: 10/11/2022 15:46   DG Chest Portable 1 View  Result Date: 10/11/2022 CLINICAL DATA:  Left rib pain after multiple falls. EXAM: PORTABLE CHEST 1 VIEW COMPARISON:  May 16, 2022.  April 27, 2022. FINDINGS: Stable cardiomegaly. Old left rib fractures are noted. Mild left basilar atelectasis or infiltrate is noted with small pleural effusion. Right lung is clear. IMPRESSION: Mild left basilar atelectasis or infiltrate is noted with small left pleural effusion. Electronically Signed   By: Lupita Raider M.D.   On: 10/11/2022 15:45    Procedures Procedures   Orthostatic Lying BP- Lying: 149/55 Pulse- Lying: 59 Orthostatic Sitting BP- Sitting: 153/62 Pulse- Sitting: 65 Orthostatic Standing at 0 minutes BP- Standing at 0 minutes: 109/51 Pulse- Standing at 0 minutes: 64 Orthostatic Standing at 3  minutes BP- Standing at 3 minutes: 136/52 Pulse- Standing at 3 minutes: 64    Repeat  Orthostatic Lying BP- Lying: 162/59 Pulse- Lying: 59 Orthostatic Sitting BP- Sitting: 161/63 Pulse- Sitting: 62 Orthostatic Standing at 0 minutes BP- Standing at 0 minutes: 160/57 Pulse- Standing at 0 minutes: 70 Orthostatic Standing at 3 minutes BP- Standing at 3 minutes: 166/61 Pulse- Standing at 3 minutes: 68    Medications Ordered in ED Medications  Tdap (BOOSTRIX) injection 0.5 mL (0.5 mLs Intramuscular Given 10/11/22 1510)  lactated ringers bolus 500 mL (0 mLs Intravenous Stopped 10/11/22 1651)  amoxicillin-clavulanate (AUGMENTIN) 875-125 MG per tablet 1 tablet (1 tablet Oral Given 10/11/22 1943)  ED Course/ Medical Decision Making/ A&P                                Medical Decision Making Amount and/or Complexity of Data Reviewed Labs: ordered. Decision-making details documented in ED Course. Radiology: ordered. Decision-making details documented in ED Course. ECG/medicine tests: ordered. Decision-making details documented in ED Course.  Risk Prescription drug management.   Medical Decision Making:   PHOENIX DREY is a 89 y.o. male who presented to the ED today with dizziness / multiple falls detailed above.    Additional history discussed with patient's family/caregivers.  Patient's presentation is complicated by their history of multiple falls, advanced age, HTN, DM, HLD, CAD.  Patient placed on continuous vitals and telemetry monitoring while in ED which was reviewed periodically.  Complete initial physical exam performed, notably the patient was neurologically intact. He had multiple areas of ecchymosis and abrasions. Abdomen soft and nontender. No midline spinal tenderness.    Reviewed and confirmed nursing documentation for past medical history, family history, social history.    Initial Assessment:   With the patient's presentation differential diagnosis includes but  is not limited to BPPV, vestibular migraine, head trauma, AVM, intracranial tumor, multiple sclerosis, drug-related, CVA, vasovagal syncope, orthostatic hypotension, sepsis, hypoglycemia, electrolyte disturbance, anemia, anxiety/panic attack.  This is most consistent with an acute complicated illness  Initial Plan:  Screening labs including CBC and Metabolic panel to evaluate for infectious or metabolic etiology of disease.  Urinalysis with reflex culture ordered to evaluate for UTI or relevant urologic/nephrologic pathology.  CXR to evaluate for structural/infectious intrathoracic pathology.  EKG and troponin to evaluate for cardiac pathology Viral swabs to assess for viral etiology Pelvic x-ray to assess for traumatic injury Orthostatic vital signs CT head, C/L spine, CAP to assess for traumatic injuries Objective evaluation as below reviewed   Initial Study Results:   Laboratory  All laboratory results reviewed without evidence of clinically relevant pathology.   Exceptions include: Creatinine 2.26 at baseline, troponin 34 with repeat 33-flat, hemoglobin 10.8 similar to baseline, UA with small amounts of hemoglobin but does not appear acutely infected  EKG EKG was reviewed independently. NSR. No acute ST-T changes. No STEMI.    Radiology:  All images reviewed independently. Agree with radiology report at this time.   CT L-SPINE NO CHARGE  Result Date: 10/11/2022 CLINICAL DATA:  Recent falls, dizziness, left-sided pain EXAM: CT LUMBAR SPINE WITHOUT CONTRAST TECHNIQUE: Multidetector CT imaging of the lumbar spine was performed without intravenous contrast administration. Multiplanar CT image reconstructions were also generated. RADIATION DOSE REDUCTION: This exam was performed according to the departmental dose-optimization program which includes automated exposure control, adjustment of the mA and/or kV according to patient size and/or use of iterative reconstruction technique. COMPARISON:   04/27/2022 and previous FINDINGS: Segmentation: 5 non rib-bearing lumbar segments assigned L1-L5. Alignment: Normal Vertebrae: Chronic mild L3 vertebral compression deformity. No acute fracture. Paraspinal and other soft tissues: No paraspinal hematoma or other acute findings. Disc levels: Bridging anterior osteophytes through the visualized lower thoracic spine. Small anterior endplate spurs at all lumbar levels. Mild facet DJD L3-4 left greater than right. IMPRESSION: 1. No acute findings. 2. Chronic mild L3 vertebral compression deformity. Electronically Signed   By: Corlis Leak M.D.   On: 10/11/2022 18:19   CT CHEST ABDOMEN PELVIS WO CONTRAST  Result Date: 10/11/2022 CLINICAL DATA:  Larey Seat, left-sided pain EXAM: CT CHEST, ABDOMEN AND PELVIS WITHOUT  CONTRAST TECHNIQUE: Multidetector CT imaging of the chest, abdomen and pelvis was performed following the standard protocol without IV contrast. RADIATION DOSE REDUCTION: This exam was performed according to the departmental dose-optimization program which includes automated exposure control, adjustment of the mA and/or kV according to patient size and/or use of iterative reconstruction technique. COMPARISON:  04/27/2022 and previous FINDINGS: CT CHEST FINDINGS Cardiovascular: Heart size upper limits normal. Trace pericardial fluid. Coronary and aortic calcifications without aneurysm. Mediastinum/Nodes: No mediastinal hematoma, mass, or adenopathy. Lungs/Pleura: Loculated effusion at the left lung base stable. Stable scarring in the inferolateral lingula. Some increase in peripheral interstitial opacities at the right lung base. Musculoskeletal: Vertebral endplate spurring at multiple levels in the mid and lower thoracic spine. No acute findings. CT ABDOMEN PELVIS FINDINGS Hepatobiliary: No focal liver abnormality is seen. No gallstones, gallbladder wall thickening, or biliary dilatation. Pancreas: 2.6 cm fluid attenuation process adjacent to the pancreatic tail,  stable since PET-CT 01/13/2021. No ductal dilatation or regional inflammatory change. Spleen: Normal in size without focal abnormality. Adrenals/Urinary Tract: No adrenal mass. Bilateral urolithiasis, largest stone 2 mm in the left lower pole. No hydronephrosis. Mild renal parenchymal atrophy left greater than right. Urinary bladder incompletely distended. Stomach/Bowel: Stomach is nondistended, unremarkable. Small bowel nondilated. Appendix not visualized. Coarse calcifications in the right lower quadrant mesentery peripherally, stable since 02/02/2021. The colon is partially distended, with multiple sigmoid diverticula; no adjacent inflammatory change. Vascular/Lymphatic: Moderate aortoiliac calcified atheromatous plaque with stable fusiform 3 cm infrarenal aneurysm terminating above the bifurcation. No abdominal or pelvic adenopathy. Reproductive: Prostate enlargement with central coarse calcification. Other: No ascites.  No free air. Musculoskeletal: Chronic L3 vertebral compression deformity. Normal alignment. Bilateral hip DJD. IMPRESSION: 1. No acute findings. 2. Stable loculated left pleural effusion. 3. Chronic 2.6 cm fluid attenuation process adjacent to the pancreatic tail. 4. Bilateral urolithiasis without hydronephrosis. 5. Sigmoid diverticulosis. 6. Some increase in peripheral interstitial opacities at the right lung base. 7. Stable 3 cm AAA. Recommend follow-up ultrasound every 3 years. (Ref.: Chaikof et Karsten Ro Vasc Surg 2018 Jan;67(1):2-77.e2.) Electronically Signed   By: Corlis Leak M.D.   On: 10/11/2022 18:16   CT Head Wo Contrast  Result Date: 10/11/2022 CLINICAL DATA:  Head trauma, minor (Age >= 65y); Neck trauma (Age >= 65y) EXAM: CT HEAD WITHOUT CONTRAST CT CERVICAL SPINE WITHOUT CONTRAST TECHNIQUE: Multidetector CT imaging of the head and cervical spine was performed following the standard protocol without intravenous contrast. Multiplanar CT image reconstructions of the cervical spine were  also generated. RADIATION DOSE REDUCTION: This exam was performed according to the departmental dose-optimization program which includes automated exposure control, adjustment of the mA and/or kV according to patient size and/or use of iterative reconstruction technique. COMPARISON:  CT head and C Spine 09/28/20 FINDINGS: CT HEAD FINDINGS Brain: No evidence of acute infarction, hemorrhage, hydrocephalus, extra-axial collection or mass lesion/mass effect. Vascular: No hyperdense vessel or unexpected calcification. Skull: Normal. Negative for fracture or focal lesion. Sinuses/Orbits: No middle ear or mastoid effusion. Frothy secretions in the right sphenoid sinus with an air-fluid level. Bilateral lens replacement. Orbits are otherwise unremarkable. Other: None. CT CERVICAL SPINE FINDINGS Limitations: Motion degraded exam Alignment: Trace stepwise anterolisthesis of C3 on C4, C4 on C5, and C5 on C6. Skull base and vertebrae: No acute fracture. No primary bone lesion or focal pathologic process. Unchanged mild superior endplate height loss at C7 compared to 2022. Soft tissues and spinal canal: No prevertebral fluid or swelling. No visible canal hematoma. Disc levels:  No evidence of high-grade spinal canal stenosis Upper chest: Negative. Other: Negative IMPRESSION: 1. No acute intracranial abnormality. 2. No acute fracture or traumatic malalignment of the cervical spine. 3. Frothy secretions in the right sphenoid sinus with an air-fluid level. Correlate for acute sinusitis. Electronically Signed   By: Lorenza Cambridge M.D.   On: 10/11/2022 17:38   CT Cervical Spine Wo Contrast  Result Date: 10/11/2022 CLINICAL DATA:  Head trauma, minor (Age >= 65y); Neck trauma (Age >= 65y) EXAM: CT HEAD WITHOUT CONTRAST CT CERVICAL SPINE WITHOUT CONTRAST TECHNIQUE: Multidetector CT imaging of the head and cervical spine was performed following the standard protocol without intravenous contrast. Multiplanar CT image reconstructions of  the cervical spine were also generated. RADIATION DOSE REDUCTION: This exam was performed according to the departmental dose-optimization program which includes automated exposure control, adjustment of the mA and/or kV according to patient size and/or use of iterative reconstruction technique. COMPARISON:  CT head and C Spine 09/28/20 FINDINGS: CT HEAD FINDINGS Brain: No evidence of acute infarction, hemorrhage, hydrocephalus, extra-axial collection or mass lesion/mass effect. Vascular: No hyperdense vessel or unexpected calcification. Skull: Normal. Negative for fracture or focal lesion. Sinuses/Orbits: No middle ear or mastoid effusion. Frothy secretions in the right sphenoid sinus with an air-fluid level. Bilateral lens replacement. Orbits are otherwise unremarkable. Other: None. CT CERVICAL SPINE FINDINGS Limitations: Motion degraded exam Alignment: Trace stepwise anterolisthesis of C3 on C4, C4 on C5, and C5 on C6. Skull base and vertebrae: No acute fracture. No primary bone lesion or focal pathologic process. Unchanged mild superior endplate height loss at C7 compared to 2022. Soft tissues and spinal canal: No prevertebral fluid or swelling. No visible canal hematoma. Disc levels:  No evidence of high-grade spinal canal stenosis Upper chest: Negative. Other: Negative IMPRESSION: 1. No acute intracranial abnormality. 2. No acute fracture or traumatic malalignment of the cervical spine. 3. Frothy secretions in the right sphenoid sinus with an air-fluid level. Correlate for acute sinusitis. Electronically Signed   By: Lorenza Cambridge M.D.   On: 10/11/2022 17:38   DG Shoulder Right Portable  Result Date: 10/11/2022 CLINICAL DATA:  Right shoulder pain after multiple falls yesterday. EXAM: RIGHT SHOULDER - 1 VIEW COMPARISON:  December 24, 2019. FINDINGS: There is no evidence of fracture or dislocation. Moderate degenerative changes seen involving the right acromioclavicular and glenohumeral joints. Soft tissues are  unremarkable. IMPRESSION: Moderate degenerative joint disease as noted above. No acute abnormality seen. Electronically Signed   By: Lupita Raider M.D.   On: 10/11/2022 15:48   DG Shoulder Left Portable  Result Date: 10/11/2022 CLINICAL DATA:  Left shoulder pain after multiple falls yesterday. EXAM: LEFT SHOULDER COMPARISON:  September 24, 2020. FINDINGS: There is no evidence of fracture or dislocation. There is no evidence of arthropathy or other focal bone abnormality. Soft tissues are unremarkable. IMPRESSION: Negative. Electronically Signed   By: Lupita Raider M.D.   On: 10/11/2022 15:47   DG Pelvis Portable  Result Date: 10/11/2022 CLINICAL DATA:  Multiple falls. EXAM: PORTABLE PELVIS 1-2 VIEWS COMPARISON:  July 14, 2019. FINDINGS: There is no evidence of pelvic fracture or diastasis. No pelvic bone lesions are seen. IMPRESSION: Negative. Electronically Signed   By: Lupita Raider M.D.   On: 10/11/2022 15:46   DG Chest Portable 1 View  Result Date: 10/11/2022 CLINICAL DATA:  Left rib pain after multiple falls. EXAM: PORTABLE CHEST 1 VIEW COMPARISON:  May 16, 2022.  April 27, 2022. FINDINGS: Stable cardiomegaly. Old left  rib fractures are noted. Mild left basilar atelectasis or infiltrate is noted with small pleural effusion. Right lung is clear. IMPRESSION: Mild left basilar atelectasis or infiltrate is noted with small left pleural effusion. Electronically Signed   By: Lupita Raider M.D.   On: 10/11/2022 15:45      Final Assessment and Plan:   89 year old male presents to the ED for evaluation of multiple falls.  Started after COVID vaccination last week.  Patient has scattered abrasions over his face, head, and extremities mostly over the arms.  Mostly complaining of left rib and and shoulder pain.  Does have significant ecchymosis over the left proximal upper extremity but no obvious deformity.  Neurovascularly intact distally x 4.  Patient mentating appropriately.  Vital signs reassuring.   Nontoxic-appearing.  Following secondary to dizziness.  Occurs with position changes.  Some acute distress.  Mild actually tach.  Low suspicion for CVA/TIA.  Otherwise initially reported no other recent symptoms on reexam but does admit to a recent cough.  With patient's repeat falls and significant medical history with multiple various areas of trauma over the spine, essentially completed trauma workup including CT head, C-spine, chest abdomen and pelvis, and x-rays of chest, pelvis, and shoulders.  No acute findings on chest or pelvic x-ray.  Negative shoulder x-rays.  Negative viral swabs.  No acute EKG changes.  Troponins chronically elevated though flat.  No chest pain.  Do not suspect ACS.  UA does not appear acutely infected.  Kidney function at baseline.  No significant electrolyte disturbance.  Patient is profoundly orthostatic on vital signs.  With advanced age and cardiomegaly, gave small fluid bolus with resolution of orthostasis.  Patient and ambulatory and asymptomatic.  Scans essentially negative for traumatic findings.  Incidental fluid in the sinuses and questionable new consolidation at the right lung base.  With this, recent cough and dizziness, will treat for acute sinusitis, question early developing pneumonia though higher suspicion that this may be secondary to polypharmacy.  Other incidental findings discussed with patient and wife at bedside.  Patient feels well on reassessment, ambulatory.  Reviewed patient's medication list and medications diabetic symptoms with patient and wife.  He did recently start amlodipine for better blood pressure control.  Will have patient check blood pressure at night before administering his medication and only take if hypertensive.  Will follow-up closely with PCP and other outpatient team including cardiology for further recommendations.  Discussed ways to make home safer and ways to help with orthostasis in the outpatient setting.  Patient already has PT/OT  established in his home, wife in the process of establishing home health.  Will continue to work with this as well as patient's outpatient team for better management of orthostasis.  With overwhelmingly otherwise reassuring workup, do believe patient is stable for discharge home and wife is comfortable taking care of patient at this time.  Will discharge patient home with Augmentin for treatment of findings as above.  Strict ED return precautions given, all questions answered, stable for discharge.   Clinical Impression:  1. Orthostatic hypotension   2. Multiple falls   3. Multiple abrasions   4. Injury of head, initial encounter   5. Dizziness   6. Acute non-recurrent sphenoidal sinusitis      Discharge           Final Clinical Impression(s) / ED Diagnoses Final diagnoses:  Multiple falls  Multiple abrasions  Injury of head, initial encounter  Orthostatic hypotension  Dizziness  Acute  non-recurrent sphenoidal sinusitis    Rx / DC Orders ED Discharge Orders          Ordered    amoxicillin-clavulanate (AUGMENTIN) 875-125 MG tablet  Every 12 hours        10/11/22 1926              Richardson Dopp 10/11/22 2342    Gwyneth Sprout, MD 10/12/22 2333

## 2022-10-11 NOTE — Telephone Encounter (Signed)
Patient's wife [name] called to follow up on dizziness patient experienced after receiving COVID and FLU vaccines Friday morning, 10/05/22.   [name] stated patient fell Saturday and Monday mornings due to dizziness, and paramedics came. [name] stated patient's wounds were mostly surface wounds, and they did not recommend for patient to go to the ED. [name] stated the paramedics advised her to watch the patient for a concussion.  Since that time, patient is complaining of pain; stated they've found bruises in other areas. Unsure if patient needs imaging or just an appointment; specifically wants to address Left side rib pain. Patient also has large bruise on spine discovered yesterday/last night, and bruise on one of his fingers discovered today.   Home Health team came to do a home visit on Monday, 10/09/22. [name] stated they removed the bandages initially applied which were stuck to patient's skin, and advised her to keep the wounds exposed so they can heal.   Requesting call back with best advice at [phone].

## 2022-10-11 NOTE — ED Notes (Signed)
Patient verbalizes understanding of discharge instructions. Opportunity for questioning and answers were provided. Patient discharged from ED. IV removed x1, vss

## 2022-10-12 ENCOUNTER — Other Ambulatory Visit (HOSPITAL_BASED_OUTPATIENT_CLINIC_OR_DEPARTMENT_OTHER): Payer: Self-pay

## 2022-10-12 DIAGNOSIS — S51812D Laceration without foreign body of left forearm, subsequent encounter: Secondary | ICD-10-CM | POA: Diagnosis not present

## 2022-10-15 DIAGNOSIS — I509 Heart failure, unspecified: Secondary | ICD-10-CM | POA: Diagnosis not present

## 2022-10-15 DIAGNOSIS — J449 Chronic obstructive pulmonary disease, unspecified: Secondary | ICD-10-CM | POA: Diagnosis not present

## 2022-10-15 DIAGNOSIS — R42 Dizziness and giddiness: Secondary | ICD-10-CM | POA: Diagnosis not present

## 2022-10-15 DIAGNOSIS — I4891 Unspecified atrial fibrillation: Secondary | ICD-10-CM | POA: Diagnosis not present

## 2022-10-18 ENCOUNTER — Other Ambulatory Visit: Payer: Self-pay | Admitting: Family Medicine

## 2022-10-18 ENCOUNTER — Ambulatory Visit (INDEPENDENT_AMBULATORY_CARE_PROVIDER_SITE_OTHER): Payer: PPO | Admitting: Family Medicine

## 2022-10-18 VITALS — BP 144/60 | HR 84 | Temp 97.9°F | Ht 66.0 in | Wt 186.6 lb

## 2022-10-18 DIAGNOSIS — I951 Orthostatic hypotension: Secondary | ICD-10-CM | POA: Diagnosis not present

## 2022-10-18 NOTE — Telephone Encounter (Signed)
Requested Prescriptions  Pending Prescriptions Disp Refills   escitalopram (LEXAPRO) 10 MG tablet [Pharmacy Med Name: ESCITALOPRAM 10 MG TABLET] 90 tablet 0    Sig: TAKE 1 TABLET BY MOUTH EVERY DAY     Psychiatry:  Antidepressants - SSRI Failed - 10/18/2022  1:29 AM      Failed - Valid encounter within last 6 months    Recent Outpatient Visits           1 year ago Viral URI   [facility] Tanya Nones, Priscille Heidelberg, MD   1 year ago Hordeolum externum of left upper eyelid   [facility] Valentino Nose, NP   2 years ago Fever, unspecified fever cause   [facility] Pickard, Priscille Heidelberg, MD   2 years ago Sebaceous cyst   [facility] Tanya Nones, Priscille Heidelberg, MD   2 years ago Itching of ear   [facility] Pickard, Priscille Heidelberg, MD       Future Appointments             In 2 months Tonny Bollman, MD [facility]             doxazosin (CARDURA) 2 MG tablet [Pharmacy Med Name: DOXAZOSIN MESYLATE 2 MG TAB] 90 tablet 0    Sig: TAKE 1 TABLET BY MOUTH EVERY DAY     Cardiovascular:  Alpha Blockers Failed - 10/18/2022  1:29 AM      Failed - Valid encounter within last 6 months    Recent Outpatient Visits           1 year ago Viral URI   [facility] Tanya Nones, Priscille Heidelberg, MD   1 year ago Hordeolum externum of left upper eyelid   [facility] Valentino Nose, NP   2 years ago Fever, unspecified fever cause   [facility] Tanya Nones, Priscille Heidelberg, MD   2 years ago Sebaceous cyst   [facility] Donita Brooks, MD   2 years ago Itching of ear   [facility] Pickard, Priscille Heidelberg, MD       Future Appointments             In 2 months Tonny Bollman, MD [facility] at [facility]            Passed - Last BP in normal range    BP Readings from Last 1 Encounters:  10/18/22  (!) 144/60

## 2022-10-18 NOTE — Progress Notes (Signed)
Subjective:    Patient ID: [name], male    DOB: 29-Apr-1933, 89 y.o.   MRN: [id number]   Patient is a very pleasant 89 year old Caucasian gentleman who presents today with frequent falls and dizziness upon standing.  In the emergency room, the patient had a CT scan of the brain, this showed sinusitis.  He had a chest x-ray that showed possible left lower lobe atelectasis and left pleural effusion however these are chronic findings.  He was started on Augmentin for possible pneumonia and/or a sinus infection.  However he was documented to have orthostatic hypotension.  He is sitting and lying blood pressures are 1 40-1 50 systolic however with standing, his systolic blood pressure dropped to near 100.  Wife states that when he stands up to start walking he frequently feels like he is going to pass out and loses balance.  Past Medical History:  Diagnosis Date   Acute blood loss anemia    Acute upper respiratory infections of unspecified site 11/04/2012   AKI (acute kidney injury) (HCC)    Alzheimer disease (HCC)    Benign prostatic hyperplasia    CAD (coronary artery disease)    s/p cypher DES to pLAD 6/08; normal LVF;  ETT-Myoview 2009: no ischemia    Clavicle fracture 08/10/2019   Closed displaced fracture of phalanx of left thumb, sequela 08/10/2019   Coronary atherosclerosis of native coronary artery 03/08/2008   Dizziness 03/08/2020   Eosinophilia    Essential hypertension    Fracture of multiple ribs with pain 08/10/2019   Hematoma 11/04/2012   History of multiple falls    History of SAH (subarachnoid hemorrhage) 07/14/2019   Hyperlipidemia type IIB / III 03/08/2008   Itching of ear 07/14/2021   Major neurocognitive disorder due to possible Alzheimer's disease, without behavioral disturbance (HCC) 02/29/2020   MI (myocardial infarction) (HCC)    Moderate persistent asthma    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   Imaging revealed [facility]    Multiple trauma    Nocturnal leg cramps 11/23/2012   Orthostatic hypotension 03/08/2008   Otalgia of left ear 05/11/2021   Otorrhagia of left ear 03/30/2021   Pain in joint, shoulder region 11/23/2012   Stage 3b chronic kidney disease (HCC)    Thrombocytopenia (HCC)    Trigeminal neuralgia    Type 2 diabetes mellitus (HCC)    Past Surgical History:  Procedure Laterality Date   APPENDECTOMY     CARDIAC CATHETERIZATION  07/30/2006   CORONARY ANGIOPLASTY WITH STENT PLACEMENT   CARDIAC CATHETERIZATION     CATARACT EXTRACTION Right 03/2022   CATARACT EXTRACTION Left 04/2022   EXPLORATORY LAPAROTOMY     age 81   EXPLORATORY LAPAROTOMY     IR THORACENTESIS ASP PLEURAL SPACE W/IMG GUIDE  10/05/2019   Current Outpatient Medications on File Prior to Visit  Medication Sig Dispense Refill   albuterol (PROAIR HFA) 108 (90 Base) MCG/ACT inhaler INHALE 2 PUFFS INTO THE LUNGS EVERY 6 HOURS AS NEEDED FOR WHEEZING OR SHORTNESS OF BREATH (Patient taking differently: Inhale 2 puffs into the lungs every 6 (six) hours as needed for wheezing or shortness of breath. INHALE 2 PUFFS INTO THE LUNGS EVERY 6 HOURS AS NEEDED FOR WHEEZING OR SHORTNESS OF BREATH) 6.7 g 0   amiodarone (PACERONE) 200 MG tablet Take 1 tablet (200 mg total) by mouth daily. 90 tablet 3   amoxicillin-clavulanate (AUGMENTIN) 875-125 MG tablet Take 1 tablet by mouth every 12 (twelve) hours for  10 days. 19 tablet 0   aspirin EC 81 MG tablet Take 1 tablet (81 mg total) by mouth daily. Swallow whole. 30 tablet 12   atorvastatin (LIPITOR) 20 MG tablet TAKE 1 TABLET BY MOUTH EVERY DAY 60 tablet 0   doxazosin (CARDURA) 2 MG tablet TAKE 1 TABLET BY MOUTH EVERY DAY 90 tablet 0   empagliflozin (JARDIANCE) 10 MG TABS tablet TAKE 1 TABLET BY MOUTH DAILY BEFORE BREAKFAST. 90 tablet 0   escitalopram (LEXAPRO) 10 MG tablet TAKE 1 TABLET BY MOUTH EVERY DAY 90 tablet 0   ezetimibe (ZETIA) 10 MG tablet TAKE 1 TABLET BY MOUTH EVERY DAY 90 tablet 3    fluticasone furoate-vilanterol (BREO ELLIPTA) 200-25 MCG/ACT AEPB INHALE 1 PUFF DAILY *NEED APPT* (Patient taking differently: Inhale 1 puff into the lungs daily. INHALE 1 PUFF DAILY *NEED APPT*) 60 each 11   furosemide (LASIX) 40 MG tablet Take 1 tablet (40 mg total) by mouth every other day. (Patient taking differently: Take 40 mg by mouth as needed for edema or fluid.) 45 tablet 3   loratadine (CLARITIN) 10 MG tablet Take 10 mg by mouth daily as needed for allergies or rhinitis.      meclizine (ANTIVERT) 12.5 MG tablet Take 1 tablet (12.5 mg total) by mouth 3 (three) times daily as needed for dizziness. 15 tablet 0   memantine (NAMENDA) 5 MG tablet TAKE 1 TABLET BY MOUTH EVERYDAY AT BEDTIME 90 tablet 1   Multiple Vitamin (MULTIVITAMIN WITH MINERALS) TABS tablet Take 1 tablet by mouth daily.     No current facility-administered medications on file prior to visit.   Allergies  Allergen Reactions   Gabapentin Other (See Comments)    Visual changes and confusion- "I went blind while I was driving"   Social History   Socioeconomic History   Marital status: Married    Spouse name: Clydie Braun   Number of children: 1   Years of education: 20   Highest education level: Patent examiner History   Occupation: retired professor    Comment: History    Occupation: missionary  Tobacco Use   Smoking status: Never   Smokeless tobacco: Never  Advertising account planner   Vaping status: Never Used  Substance and Sexual Activity   Alcohol use: Not Currently   Drug use: Never   Sexual activity: Yes    Partners: Female  Other Topics Concern   Not on file  Social History Narrative   ** Merged History Encounter **       Lives with his wife (second marriage in 2015, first marriage ended when his wife died alzheimer's disease). Since his remarriage, his adult daughter doesn't speak with him.      Right handed   Social Determinants of Health   Financial Resource Strain: Low Risk  (02/09/2022)   Overall  Financial Resource Strain (CARDIA)    Difficulty of Paying Living Expenses: Not hard at all  Food Insecurity: No Food Insecurity (05/09/2022)   Hunger Vital Sign    Worried About Running Out of Food in the Last Year: Never true    Ran Out of Food in the Last Year: Never true  Transportation Needs: No Transportation Needs (05/21/2022)   PRAPARE - Administrator, Civil Service (Medical): No    Lack of Transportation (Non-Medical): No  Physical Activity: Not on file  Stress: Not on file  Social Connections: Not on file  Intimate Partner Violence: Not At Risk (05/05/2022)   Humiliation, Afraid, Rape, and  Kick questionnaire    Fear of Current or Ex-Partner: No    Emotionally Abused: No    Physically Abused: No    Sexually Abused: No   Family History  Problem Relation Age of Onset   Stomach cancer Mother    Memory loss Mother    Heart disease Father    Coronary artery disease Father 59   Heart attack Father    Heart disease Brother    Aortic aneurysm Brother    Colon cancer Brother    Aortic aneurysm Brother    Colon cancer Brother    Arthritis Daughter        rheumatoid   Coronary artery disease Brother    Esophageal cancer Neg Hx    Rectal cancer Neg Hx      Review of Systems  All other systems reviewed and are negative.      Objective:   Physical Exam Vitals reviewed.  Constitutional:      General: He is not in acute distress.    Appearance: He is well-developed. He is not diaphoretic.  Neck:     Thyroid: No thyromegaly.     Vascular: No carotid bruit or JVD.     Trachea: No tracheal deviation.  Cardiovascular:     Rate and Rhythm: Normal rate and regular rhythm.     Heart sounds: Normal heart sounds. No murmur heard.    No friction rub. No gallop.  Pulmonary:     Effort: Pulmonary effort is normal. No accessory muscle usage, prolonged expiration or respiratory distress.     Breath sounds: No stridor. No wheezing, rhonchi or rales.  Abdominal:      General: Bowel sounds are normal.     Palpations: Abdomen is soft.  Musculoskeletal:     Cervical back: Neck supple. No erythema, rigidity, torticollis or crepitus. No pain with movement, spinous process tenderness or muscular tenderness. Normal range of motion.     Right lower leg: No edema.     Left lower leg: No edema.  Lymphadenopathy:     Cervical: No cervical adenopathy.  Neurological:     Mental Status: He is alert.     Motor: No abnormal muscle tone.     Deep Tendon Reflexes: Reflexes are normal and symmetric.           Assessment & Plan:  Orthostatic hypotension I believe the patient is suffering from polypharmacy, possibly autonomic dysfunction, as well as advanced age.  I have recommended that we discontinued his doxazosin to reduce his risk of orthostatic hypotension.  I have asked his wife to give me an update in 1 to 2 weeks to let me know how he is doing.  I would be happy with his systolic blood pressure in the 150s.

## 2022-10-19 ENCOUNTER — Ambulatory Visit: Payer: PPO | Admitting: Physician Assistant

## 2022-10-19 DIAGNOSIS — R42 Dizziness and giddiness: Secondary | ICD-10-CM | POA: Diagnosis not present

## 2022-10-22 ENCOUNTER — Other Ambulatory Visit: Payer: Self-pay

## 2022-10-24 DIAGNOSIS — H43811 Vitreous degeneration, right eye: Secondary | ICD-10-CM | POA: Diagnosis not present

## 2022-10-24 DIAGNOSIS — Z961 Presence of intraocular lens: Secondary | ICD-10-CM | POA: Diagnosis not present

## 2022-10-26 DIAGNOSIS — H1011 Acute atopic conjunctivitis, right eye: Secondary | ICD-10-CM | POA: Diagnosis not present

## 2022-10-26 NOTE — Patient Instructions (Signed)
Visit Information  Thank you for taking time to visit with me today. Please don't hesitate to contact me if I can be of assistance to you.   Following are the goals we discussed today:   Goals Addressed             This Visit's Progress    Manage home safety, memory concerns- care coordination services       Interventions Today    Flowsheet Row Most Recent Value  Chronic Disease   Chronic disease during today's visit Other  [Memory concerns respite resource, caregiver health]  General Interventions   General Interventions Discussed/Reviewed General Interventions Reviewed, Doctor Visits, Community Resources  Doctor Visits Discussed/Reviewed Doctor Visits Reviewed, PCP  PCP/Specialist Visits Compliance with follow-up visit  Exercise Interventions   Exercise Discussed/Reviewed Exercise Reviewed, Physical Activity  Education Interventions   Education Provided Provided Education  Provided Verbal Education On Community Resources  Mental Health Interventions   Mental Health Discussed/Reviewed Mental Health Reviewed, Coping Strategies  Safety Interventions   Safety Discussed/Reviewed Safety Reviewed, Fall Risk      Interventions Today    Flowsheet Row Most Recent Value  Chronic Disease   Chronic disease during today's visit Other  [Memory concerns respite resource, caregiver health]  General Interventions   General Interventions Discussed/Reviewed General Interventions Reviewed, Doctor Visits, Community Resources  Doctor Visits Discussed/Reviewed Doctor Visits Reviewed, PCP  PCP/Specialist Visits Compliance with follow-up visit  Exercise Interventions   Exercise Discussed/Reviewed Exercise Reviewed, Physical Activity  Education Interventions   Education Provided Provided Education  Provided Verbal Education On Community Resources  Mental Health Interventions   Mental Health Discussed/Reviewed Mental Health Reviewed, Coping Strategies  Safety Interventions   Safety  Discussed/Reviewed Safety Reviewed, Fall Risk                Our next appointment is by telephone on 11/15/22 at 1045  Please call the care guide team at [phone] if you need to cancel or reschedule your appointment.   If you are experiencing a Mental Health or Behavioral Health Crisis or need someone to talk to, please call the Suicide and Crisis Lifeline: 988 call the [place] National Suicide Prevention Lifeline: (303) 420-4960 or TTY: 640-069-1252 TTY (979)740-0423) to talk to a trained counselor call 1-800-273-TALK (toll free, 24 hour hotline) call the [facility]: [phone] call 911   Patient verbalizes understanding of instructions and care plan provided today and agrees to view in MyChart. Active MyChart status and patient understanding of how to access instructions and care plan via MyChart confirmed with patient.     The patient has been provided with contact information for the care management team and has been advised to call with any health related questions or concerns.   Melany Wiesman L. Noelle Penner, RN, BSN, CCM, Care Management Coordinator [phone]

## 2022-10-29 DIAGNOSIS — H1011 Acute atopic conjunctivitis, right eye: Secondary | ICD-10-CM | POA: Diagnosis not present

## 2022-10-31 ENCOUNTER — Encounter: Payer: Self-pay | Admitting: Pharmacist

## 2022-10-31 NOTE — Progress Notes (Signed)
Pharmacy Quality Measure Review  This patient is appearing on a report for being at risk of failing the adherence measure for diabetes medications this calendar year.   Medication: Jardiance 10 mg Last fill date: 09/28/22 for 90 day supply  Insurance report was not up to date. No action needed at this time.   Catie Eppie Gibson, PharmD, BCACP, CPP Clinical Pharmacist [facility] [phone]

## 2022-11-01 ENCOUNTER — Other Ambulatory Visit: Payer: Self-pay | Admitting: Family Medicine

## 2022-11-01 ENCOUNTER — Encounter: Payer: Self-pay | Admitting: Family Medicine

## 2022-11-01 MED ORDER — AMLODIPINE BESYLATE 5 MG PO TABS: 5.0000 mg | ORAL_TABLET | Freq: Every day | ORAL | 3 refills | Status: DC

## 2022-11-02 ENCOUNTER — Telehealth: Payer: Self-pay

## 2022-11-02 ENCOUNTER — Other Ambulatory Visit: Payer: Self-pay | Admitting: Family Medicine

## 2022-11-02 NOTE — Telephone Encounter (Signed)
Transition Care Management Unsuccessful Follow-up Telephone Call  Date of discharge and from where:  10/11/2022 [facility]  Attempts:  1st Attempt  Reason for unsuccessful TCM follow-up call:  Left voice message  Tamakia Porto [facility]  [facility], [facility] Guide Direct Dial: [phone]  Website: [email or web address]

## 2022-11-02 NOTE — Telephone Encounter (Signed)
Requested Prescriptions  Pending Prescriptions Disp Refills   atorvastatin (LIPITOR) 20 MG tablet [Pharmacy Med Name: ATORVASTATIN 20 MG TABLET] 90 tablet 1    Sig: TAKE 1 TABLET BY MOUTH EVERY DAY     Cardiovascular:  Antilipid - Statins Failed - 11/02/2022  9:33 AM      Failed - Valid encounter within last 12 months    Recent Outpatient Visits           1 year ago Viral URI   [facility] Pickard, Priscille Heidelberg, MD   1 year ago Hordeolum externum of left upper eyelid   [facility] Valentino Nose, NP   2 years ago Fever, unspecified fever cause   [facility] Pickard, Priscille Heidelberg, MD   2 years ago Sebaceous cyst   [facility] Tanya Nones, Priscille Heidelberg, MD   2 years ago Itching of ear   [facility] Donita Brooks, MD       Future Appointments             In 2 months Tonny Bollman, MD [facility]            Failed - Lipid Panel in normal range within the last 12 months    Cholesterol, Total  Date Value Ref Range Status  04/26/2019 129 100 - 199 mg/dL Final   Cholesterol  Date Value Ref Range Status  04/28/2022 110 0 - 200 mg/dL Final   LDL Cholesterol (Calc)  Date Value Ref Range Status  12/15/2021 52 mg/dL (calc) Final    Comment:    Reference range: <100 . Desirable range <100 mg/dL for primary prevention;   <70 mg/dL for patients with CHD or diabetic patients  with > or = 2 CHD risk factors. Marland Kitchen LDL-C is now calculated using the Martin-Hopkins  calculation, which is a validated novel method providing  better accuracy than the Friedewald equation in the  estimation of LDL-C.  Horald Pollen et al. Lenox Ahr. 8119;147(82): 2061-2068  (http://education.[facility].com/faq/FAQ164)    LDL Cholesterol  Date Value Ref Range Status  04/28/2022 52 0 - 99 mg/dL Final    Comment:           Total Cholesterol/HDL:CHD Risk Coronary Heart Disease Risk  Table                     Men   Women  1/2 Average Risk   3.4   3.3  Average Risk       5.0   4.4  2 X Average Risk   9.6   7.1  3 X Average Risk  23.4   11.0        Use the calculated Patient Ratio above and the CHD Risk Table to determine the patient's CHD Risk.        ATP III CLASSIFICATION (LDL):  <100     mg/dL   Optimal  956-213  mg/dL   Near or Above                    Optimal  130-159  mg/dL   Borderline  086-578  mg/dL   High  >469     mg/dL   Very High Performed at [facility], [address], [place], [place] [address]    HDL  Date Value Ref Range Status  04/28/2022 50 >40 mg/dL Final  84/13/2440 58 >10 mg/dL Final  Triglycerides  Date Value Ref Range Status  04/28/2022 39 <150 mg/dL Final         Passed - Patient is not pregnant

## 2022-11-05 ENCOUNTER — Telehealth: Payer: Self-pay

## 2022-11-05 NOTE — Telephone Encounter (Signed)
Transition Care Management Unsuccessful Follow-up Telephone Call  Date of discharge and from where:  10/11/2022 [facility]  Attempts:  2nd Attempt  Reason for unsuccessful TCM follow-up call:  Left voice message

## 2022-11-07 DIAGNOSIS — R531 Weakness: Secondary | ICD-10-CM | POA: Diagnosis not present

## 2022-11-07 DIAGNOSIS — I4891 Unspecified atrial fibrillation: Secondary | ICD-10-CM | POA: Diagnosis not present

## 2022-11-07 DIAGNOSIS — G20A1 Parkinson's disease without dyskinesia, without mention of fluctuations: Secondary | ICD-10-CM | POA: Diagnosis not present

## 2022-11-07 DIAGNOSIS — N183 Chronic kidney disease, stage 3 unspecified: Secondary | ICD-10-CM | POA: Diagnosis not present

## 2022-11-07 DIAGNOSIS — J449 Chronic obstructive pulmonary disease, unspecified: Secondary | ICD-10-CM | POA: Diagnosis not present

## 2022-11-07 DIAGNOSIS — G309 Alzheimer's disease, unspecified: Secondary | ICD-10-CM | POA: Diagnosis not present

## 2022-11-07 DIAGNOSIS — F0280 Dementia in other diseases classified elsewhere without behavioral disturbance: Secondary | ICD-10-CM | POA: Diagnosis not present

## 2022-11-07 DIAGNOSIS — I130 Hypertensive heart and chronic kidney disease with heart failure and stage 1 through stage 4 chronic kidney disease, or unspecified chronic kidney disease: Secondary | ICD-10-CM | POA: Diagnosis not present

## 2022-11-07 DIAGNOSIS — R300 Dysuria: Secondary | ICD-10-CM | POA: Diagnosis not present

## 2022-11-07 DIAGNOSIS — E1122 Type 2 diabetes mellitus with diabetic chronic kidney disease: Secondary | ICD-10-CM | POA: Diagnosis not present

## 2022-11-07 DIAGNOSIS — I509 Heart failure, unspecified: Secondary | ICD-10-CM | POA: Diagnosis not present

## 2022-11-12 ENCOUNTER — Encounter: Payer: Self-pay | Admitting: Physician Assistant

## 2022-11-12 ENCOUNTER — Ambulatory Visit: Payer: PPO | Admitting: Physician Assistant

## 2022-11-12 VITALS — BP 132/60 | HR 71 | Resp 18 | Ht 66.0 in | Wt 182.0 lb

## 2022-11-12 DIAGNOSIS — F0390 Unspecified dementia without behavioral disturbance: Secondary | ICD-10-CM | POA: Diagnosis not present

## 2022-11-12 NOTE — Patient Instructions (Signed)
It was a pleasure to see you today at our office.   Recommendations:  Follow up April 8 at 11:30  Continue Memantine 5 mg at night    Whom to call:  Memory  decline, memory medications: Call our office [phone]   For psychiatric meds, mood meds: Please have your primary care physician manage these medications.   Counseling regarding caregiver distress, including caregiver depression, anxiety and issues regarding community resources, adult day care programs, adult living facilities, or memory care questions:   Feel free to contact Misty Lisabeth Register, Social Worker at [phone]   For assessment of decision of mental capacity and competency:  Call Dr. Erick Blinks, geriatric psychiatrist at [phone]  For guidance in geriatric dementia issues please call [facility] [phone]  For guidance regarding [facility] and if placement were needed at the facility, contact Sidney Ace, Social Worker tel: [phone]  If you have any severe symptoms of a stroke, or other severe issues such as confusion,severe chills or fever, etc call 911 or go to the ER as you may need to be evaluated further   Feel free to visit Facebook page " [email or web address]" for tips of how to care for people with memory problems.       RECOMMENDATIONS FOR ALL PATIENTS WITH MEMORY PROBLEMS: 1. Continue to exercise (Recommend 30 minutes of walking everyday, or 3 hours every week) 2. Increase social interactions - continue going to [place] and enjoy social gatherings with friends and family 3. Eat healthy, avoid fried foods and eat more fruits and vegetables 4. Maintain adequate blood pressure, blood sugar, and blood cholesterol level. Reducing the risk of stroke and cardiovascular disease also helps promoting better memory. 5. Avoid stressful situations. Live a simple life and avoid aggravations. Organize your time and prepare for the next day in anticipation. 6. Sleep well, avoid  any interruptions of sleep and avoid any distractions in the bedroom that may interfere with adequate sleep quality 7. Avoid sugar, avoid sweets as there is a strong link between excessive sugar intake, diabetes, and cognitive impairment We discussed the Mediterranean diet, which has been shown to help patients reduce the risk of progressive memory disorders and reduces cardiovascular risk. This includes eating fish, eat fruits and green leafy vegetables, nuts like almonds and hazelnuts, walnuts, and also use olive oil. Avoid fast foods and fried foods as much as possible. Avoid sweets and sugar as sugar use has been linked to worsening of memory function.  There is always a concern of gradual progression of memory problems. If this is the case, then we may need to adjust level of care according to patient needs. Support, both to the patient and caregiver, should then be put into place.    The Alzheimer's Association is here all day, every day for people facing Alzheimer's disease through our free 24/7 Helpline: 505-228-9494. The Helpline provides reliable information and support to all those who need assistance, such as individuals living with memory loss, Alzheimer's or other dementia, caregivers, health care professionals and the public.  Our highly trained and knowledgeable staff can help you with: Understanding memory loss, dementia and Alzheimer's  Medications and other treatment options  General information about aging and brain health  Skills to provide quality care and to find the best care from professionals  Legal, financial and living-arrangement decisions Our Helpline also features: Confidential care consultation provided by master's level clinicians who can help with decision-making support, crisis assistance and education on issues families  face every day  Help in a caller's preferred language using our translation service that features more than 200 languages and dialects  Referrals  to local community programs, services and ongoing support     FALL PRECAUTIONS: Be cautious when walking. Scan the area for obstacles that may increase the risk of trips and falls. When getting up in the mornings, sit up at the edge of the bed for a few minutes before getting out of bed. Consider elevating the bed at the head end to avoid drop of blood pressure when getting up. Walk always in a well-lit room (use night lights in the walls). Avoid area rugs or power cords from appliances in the middle of the walkways. Use a walker or a cane if necessary and consider physical therapy for balance exercise. Get your eyesight checked regularly.  FINANCIAL OVERSIGHT: Supervision, especially oversight when making financial decisions or transactions is also recommended.  HOME SAFETY: Consider the safety of the kitchen when operating appliances like stoves, microwave oven, and blender. Consider having supervision and share cooking responsibilities until no longer able to participate in those. Accidents with firearms and other hazards in the house should be identified and addressed as well.   ABILITY TO BE LEFT ALONE: If patient is unable to contact 911 operator, consider using LifeLine, or when the need is there, arrange for someone to stay with patients. Smoking is a fire hazard, consider supervision or cessation. Risk of wandering should be assessed by caregiver and if detected at any point, supervision and safe proof recommendations should be instituted.  MEDICATION SUPERVISION: Inability to self-administer medication needs to be constantly addressed. Implement a mechanism to ensure safe administration of the medications.   DRIVING: Regarding driving, in patients with progressive memory problems, driving will be impaired. We advise to have someone else do the driving if trouble finding directions or if minor accidents are reported. Independent driving assessment is available to determine safety of  driving.   If you are interested in the driving assessment, you can contact the following:  [employer] in [place] [phone]  [facility] [phone]  [facility] [phone] or [phone]      Mediterranean Diet A Mediterranean diet refers to food and lifestyle choices that are based on the traditions of countries located on the [employer]. This way of eating has been shown to help prevent certain conditions and improve outcomes for people who have chronic diseases, like kidney disease and heart disease. What are tips for following this plan? Lifestyle  Cook and eat meals together with your family, when possible. Drink enough fluid to keep your urine clear or pale yellow. Be physically active every day. This includes: Aerobic exercise like running or swimming. Leisure activities like gardening, walking, or housework. Get 7-8 hours of sleep each night. If recommended by your health care provider, drink red wine in moderation. This means 1 glass a day for nonpregnant women and 2 glasses a day for men. A glass of wine equals 5 oz (150 mL). Reading food labels  Check the serving size of packaged foods. For foods such as rice and pasta, the serving size refers to the amount of cooked product, not dry. Check the total fat in packaged foods. Avoid foods that have saturated fat or trans fats. Check the ingredients list for added sugars, such as corn syrup. Shopping  At the grocery store, buy most of your food from the areas near the walls of  the store. This includes: Fresh fruits and vegetables (produce). Grains, beans, nuts, and seeds. Some of these may be available in unpackaged forms or large amounts (in bulk). Fresh seafood. Poultry and eggs. Low-fat dairy products. Buy whole ingredients instead of prepackaged foods. Buy fresh fruits and vegetables in-season from local farmers markets. Buy  frozen fruits and vegetables in resealable bags. If you do not have access to quality fresh seafood, buy precooked frozen shrimp or canned fish, such as tuna, salmon, or sardines. Buy small amounts of raw or cooked vegetables, salads, or olives from the deli or salad bar at your store. Stock your pantry so you always have certain foods on hand, such as olive oil, canned tuna, canned tomatoes, rice, pasta, and beans. Cooking  Cook foods with extra-virgin olive oil instead of using butter or other vegetable oils. Have meat as a side dish, and have vegetables or grains as your main dish. This means having meat in small portions or adding small amounts of meat to foods like pasta or stew. Use beans or vegetables instead of meat in common dishes like chili or lasagna. Experiment with different cooking methods. Try roasting or broiling vegetables instead of steaming or sauteing them. Add frozen vegetables to soups, stews, pasta, or rice. Add nuts or seeds for added healthy fat at each meal. You can add these to yogurt, salads, or vegetable dishes. Marinate fish or vegetables using olive oil, lemon juice, garlic, and fresh herbs. Meal planning  Plan to eat 1 vegetarian meal one day each week. Try to work up to 2 vegetarian meals, if possible. Eat seafood 2 or more times a week. Have healthy snacks readily available, such as: Vegetable sticks with hummus. Greek yogurt. Fruit and nut trail mix. Eat balanced meals throughout the week. This includes: Fruit: 2-3 servings a day Vegetables: 4-5 servings a day Low-fat dairy: 2 servings a day Fish, poultry, or lean meat: 1 serving a day Beans and legumes: 2 or more servings a week Nuts and seeds: 1-2 servings a day Whole grains: 6-8 servings a day Extra-virgin olive oil: 3-4 servings a day Limit red meat and sweets to only a few servings a month What are my food choices? Mediterranean diet Recommended Grains: Whole-grain pasta. Brown rice. Bulgar  wheat. Polenta. Couscous. Whole-wheat bread. Orpah Cobb. Vegetables: Artichokes. Beets. Broccoli. Cabbage. Carrots. Eggplant. Green beans. Chard. Kale. Spinach. Onions. Leeks. Peas. Squash. Tomatoes. Peppers. Radishes. Fruits: Apples. Apricots. Avocado. Berries. Bananas. Cherries. Dates. Figs. Grapes. Lemons. Melon. Oranges. Peaches. Plums. Pomegranate. Meats and other protein foods: Beans. Almonds. Sunflower seeds. Pine nuts. Peanuts. Cod. Salmon. Scallops. Shrimp. Tuna. Tilapia. Clams. Oysters. Eggs. Dairy: Low-fat milk. Cheese. Greek yogurt. Beverages: Water. Red wine. Herbal tea. Fats and oils: Extra virgin olive oil. Avocado oil. Grape seed oil. Sweets and desserts: [place] yogurt with honey. Baked apples. Poached pears. Trail mix. Seasoning and other foods: Basil. Cilantro. Coriander. Cumin. Mint. Parsley. Sage. Rosemary. Tarragon. Garlic. Oregano. Thyme. Pepper. Balsalmic vinegar. Tahini. Hummus. Tomato sauce. Olives. Mushrooms. Limit these Grains: Prepackaged pasta or rice dishes. Prepackaged cereal with added sugar. Vegetables: Deep fried potatoes (french fries). Fruits: Fruit canned in syrup. Meats and other protein foods: Beef. Pork. Lamb. Poultry with skin. Hot dogs. Tomasa Blase. Dairy: Ice cream. Sour cream. Whole milk. Beverages: Juice. Sugar-sweetened soft drinks. Beer. Liquor and spirits. Fats and oils: Butter. Canola oil. Vegetable oil. Beef fat (tallow). Lard. Sweets and desserts: Cookies. Cakes. Pies. Candy. Seasoning and other foods: Mayonnaise. Premade sauces and marinades. The items listed may  not be a complete list. Talk with your dietitian about what dietary choices are right for you. Summary The Mediterranean diet includes both food and lifestyle choices. Eat a variety of fresh fruits and vegetables, beans, nuts, seeds, and whole grains. Limit the amount of red meat and sweets that you eat. Talk with your health care provider about whether it is safe for you to drink red  wine in moderation. This means 1 glass a day for nonpregnant women and 2 glasses a day for men. A glass of wine equals 5 oz (150 mL). This information is not intended to replace advice given to you by your health care provider. Make sure you discuss any questions you have with your health care provider. Document Released: 09/15/2015 Document Revised: 10/18/2015 Document Reviewed: 09/15/2015 Elsevier Interactive Patient Education  2017 [employer].

## 2022-11-12 NOTE — Progress Notes (Signed)
Assessment/Plan:   Dementia likely due to Alzheimer's diease   [name] is a very pleasant 89 y.o. RH male with a history of hypertension, CAD, diabetes, hyperlipidemia, COPD, DM2, stage IIIb CKD, anemia,with a diagnosis of dementia likely due to Alzheimer's disease per neuropsychological evaluation on 09/2021 seen today in follow up for memory loss. Patient is currently on memantine 5 mg nightly, tolerating well.  Memory is overall stable. MMSE today is 18/30.   Follow up in 6  months. Continue Memantine 5 mg  daily. Side effects were discussed  Recommend good control of her cardiovascular risk factors Continue to control mood as per PCP     Subjective:    This patient is accompanied in the office by his wife who supplements the history.  Previous records as well as any outside records available were reviewed prior to todays visit. Patient was last seen on 04/17/22 with MMSE 19/30     Any changes in memory since last visit? "About the same". He continues to read familiar books, listening to classical music, singing, [name] channel, [employer] channel. Patient has some difficulty remembering recent conversations and people names. LTM is adequate. repeats oneself?  Endorsed Disoriented when walking into a room? He has some difficulty with orientation. At times he does not remember where the bathroom or the kitchen is.  Leaving objects in unusual places?  May misplace things but not in unusual places   Wandering behavior?  Denies.   Any personality changes since last visit?  Denies.   Any worsening depression?: Resolved. A few weeks ago when he was saying "I don't have a purpose", but currently he is doing better. He is on Lexapro with good results. Hallucinations or paranoia? Sometimes he heels that he is not alone "There people are in the bathroom"-he says   Seizures? denies    Any sleep changes?  Sometimes he has disturbing dreams. He has some REM behavior as before, grabbing  the bed railings and swinging side to side, or talking in his sleep. No sleepwalking   Sleep apnea?   Denies.   Any hygiene concerns? Needs to be reminded to shower, needs assistance due to fall risk.  Independent of bathing and dressing?  Endorsed   Does the patient needs help with medications?  Wife is in charge  Who is in charge of the finances? Wife  is in charge    Any changes in appetite? Better since discharge from hospital.    Patient have trouble swallowing? Denies.   Does the patient cook? No. Any headaches?   Denies.   Chronic back pain  denies. "Chronic, but it is bearable" -he says  Ambulates with difficulty? He uses a walker to ambulate due to fall risk   Recent falls or head injuries? He had a recent episode of dizziness and chest pain with fall on 9.5/24 requiring hospitalization, preceded by several falls attributed to Covid vaccine (by wife's report) 6 days prior. He sustained several abrasions on shoulder, mid back. No LOC. Negative cardiac and neuro workup including labs, imaging and cardiac studies. Symptoms were suspected to be related to polypharmacy (diabetic and cardiac meds). Not on PT at this time. Completed its course and has to wait a month to restart due to insurance issues.  Unilateral weakness, numbness or tingling? Denies.   Any tremors? Resting B tremors he attributes to medicines.     Any anosmia?  Denies   Any incontinence of urine?  Endorsed, wears diapers Any bowel dysfunction?  Has chronic history of intermittent bowel incontinence    Patient lives with his wife   Does the patient drive? No longer drives    Neuropsych evaluation 09/29/21 Dr. Milbert Coulter  Major Neurocognitive Disorder ("dementia") at the present time.   Regarding etiology, primary concerns remain for a late-onset Alzheimer's disease presentation remain. Dr. Autumn Messing did not benefit from repeated exposure to information across learning trials, was essentially amnestic across all memory tasks after  a brief delay, and performed poorly across recognition trials. This suggests evidence for rapid forgetting and a significant memory storage impairment, both of which are hallmark characteristics of this illness. A weakness in semantic fluency (relative to phonemic fluency), variability across confrontation naming, and impaired clock drawing would further align with typical disease trajectory. Given subtle decline in the past 18 months, this would suggest a slow moving disease process, which is encouraging.    There are likely secondary contributions to his overall clinical presentation stemming from his prior small brain bleed/TBI, as well as recent neuroimaging suggesting mild microvascular ischemic disease. These could be largely responsible for deficits surrounding processing speed, complex attention, and cognitive flexibility. Visuospatial variability could be attributed to his TBI given some likely parietal lobe involvement. However, these factors would not explain amnestic memory and overall memory impairment by themselves. As such, I continue to feel that these factors serve to worsen an underlying neurodegenerative illness, most likely late-onset Alzheimer's disease, at the present time.      History on Initial Assessment 03/10/2020: This is a pleasant 89 year old right-handed man with a history of hypertension, CAD, diabetes, hyperlipidemia, presenting for management of Alzheimer's disease. He underwent Neuropsychological testing in last month with findings concerning for AD. Records were reviewed. He had an unwitnessed fall in June 2021 and sustained several fractures and a subarachnoid hemorrhage on the left that did not require surgical intervention. He started reporting forgetfulness at that time. He was discharged home but back in the hospital a month later with headache, low back pain and fever due to UTI. Head CT showed a left hemispheric subdural hemorrhage, new from prior CT, resolved on  follow-up CT in September 2021. He had another fall in November 2021 when he missed a step. He was back in the ER a few weeks later due to increasing confusion and forgetfulness, frequent right-sided headaches, and a clicking noise in his neck, attributed to his recent fall. He was back in the ER again in January 2022 for dizziness, worse when he moves quickly or gets up too quickly, leading to falls. 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 Vestibular therapy helped with dizziness. Neuropsychological evaluation indicated prominent impairment surrounding all aspects of learning and memory, meeting criteria for Major Neurocognitive Disorder, mild end of spectrum, etiology concerning for Alzheimer's disease. There may be a vascular component as well. Prior TBI exacerbated deficits that were already present rather than being primarily responsible for dysfunction.    They report his long-term memory is good, he can remember long passages from Shakespeare. Clydie Braun reports some memory issues prior to the fall, for instance he would recall a story differently or he could not keep an appointment date in his mind or a prior decision they had made. He has not been driving. He manages his own pillbox with a good system, Clydie Braun checks behind him and notes he makes mistakes sometimes. He denies any difficulties managing  finances, Clydie Braun has been managing them since the fall in June and notes there was a notification from the bank prior to his fall. He is fairly independent, Clydie Braun stays close during dressing and bathing mostly for safety purposes. He gets dizzy the most when bending down. He uses a cane all the time. He states mood is not bad, "I'm not miserable." A few times he would ask their daughter where her mother or when Clydie Braun is coming back when she is right there. He thinks the daughter who lives with  them is a boy, using male pronouns for her, which is new. There have been times he thinks there is a second cat or the cat is a dog, mostly at night. He used to think they had 2 bedrooms.    He denies any headaches. He has occasional vertigo and feels like he would fall, last fall was in January. Dizziness mostly occurs when he gets up, but he has also reported it while sitting or supine. It lasts at most 2 minutes, no nausea/vomiting, vision changes. The other night he woke up and saw nothing except for 2 black rings that he described as full black moons, this has happened twice. He has left trigeminal neuralgia. He was having orthostatic hypotension initially after the fall and they had to stop PT. BP has improved but he still has occasional dizziness. No focal numbness/tingling/weakness. He has neck pain and notes a "clicking" in his neck. Cervical xray showed cervical spondylosis, trace grade 1 anterolisthesis at C2-3, C3-4, C5-6, and C6-7. He has low back pain. No bowel/bladder dysfunction. No anosmia but sometimes smells food when there is none, usually at night. He has mild hand tremors. He has occasional daytime drowsiness, sleeping on his back due to pain, however Clydie Braun notes he sounds like he is gasping for breath. He snores sometimes.     MRI brain with and without contrast done 04/2020 did not show any acute changes. 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    His exam on initial visit also showed hyperreflexia and fasciculations, and he was reporting neck pain. MRI cervical spine did not show any cord abnormalities, there was mild to moderate spinal stenosis and foraminal stenosis at several levels.    EMG/NCV was normal, no evidence of motor neuron disease.   PREVIOUS MEDICATIONS: Rivastigmine, Aricept  "hollow head "   CURRENT MEDICATIONS:  Outpatient Encounter Medications as of  11/12/2022  Medication Sig   albuterol (PROAIR HFA) 108 (90 Base) MCG/ACT inhaler INHALE 2 PUFFS INTO THE LUNGS EVERY 6 HOURS AS NEEDED FOR WHEEZING OR SHORTNESS OF BREATH (Patient taking differently: Inhale 2 puffs into the lungs every 6 (six) hours as needed for wheezing or shortness of breath. INHALE 2 PUFFS INTO THE LUNGS EVERY 6 HOURS AS NEEDED FOR WHEEZING OR SHORTNESS OF BREATH)   amiodarone (PACERONE) 200 MG tablet Take 1 tablet (200 mg total) by mouth daily.   amLODipine (NORVASC) 5 MG tablet Take 1 tablet (5 mg total) by mouth daily.   aspirin EC 81 MG tablet Take 1 tablet (81 mg total) by mouth daily. Swallow whole.   atorvastatin (LIPITOR) 20 MG tablet TAKE 1 TABLET BY MOUTH EVERY DAY   empagliflozin (JARDIANCE) 10 MG TABS tablet TAKE 1 TABLET BY MOUTH DAILY BEFORE BREAKFAST.   escitalopram (LEXAPRO) 10 MG tablet TAKE 1 TABLET BY MOUTH EVERY DAY   ezetimibe (ZETIA) 10 MG tablet TAKE  1 TABLET BY MOUTH EVERY DAY   fluticasone furoate-vilanterol (BREO ELLIPTA) 200-25 MCG/ACT AEPB INHALE 1 PUFF DAILY *NEED APPT* (Patient taking differently: Inhale 1 puff into the lungs daily. INHALE 1 PUFF DAILY *NEED APPT*)   furosemide (LASIX) 40 MG tablet Take 1 tablet (40 mg total) by mouth every other day. (Patient taking differently: Take 40 mg by mouth as needed for edema or fluid.)   loratadine (CLARITIN) 10 MG tablet Take 10 mg by mouth daily as needed for allergies or rhinitis.    meclizine (ANTIVERT) 12.5 MG tablet Take 1 tablet (12.5 mg total) by mouth 3 (three) times daily as needed for dizziness.   memantine (NAMENDA) 5 MG tablet TAKE 1 TABLET BY MOUTH EVERYDAY AT BEDTIME   Multiple Vitamin (MULTIVITAMIN WITH MINERALS) TABS tablet Take 1 tablet by mouth daily.   No facility-administered encounter medications on file as of 11/12/2022.       11/12/2022   12:00 PM 04/17/2022   12:00 PM 09/09/2020   12:00 PM  MMSE - Mini Mental State Exam  Orientation to time 0 0 1  Orientation to Place 3 2 5    Registration 3 3 3   Attention/ Calculation 4 5 5   Recall 0 0 0  Language- name 2 objects 2 2 2   Language- repeat 1 1 1   Language- follow 3 step command 3 3 2   Language- read & follow direction 1 1 1   Write a sentence 1 1 1   Copy design 0 1 0  Total score 18 19 21        No data to display          Objective:     PHYSICAL EXAMINATION:    VITALS:   Vitals:   11/12/22 0839  BP: 132/60  Pulse: 71  Resp: 18  SpO2: 98%  Weight: 182 lb (82.6 kg)  Height: 5\' 6"  (1.676 m)    GEN:  The patient appears stated age and is in NAD. HEENT:  Normocephalic, atraumatic.   Neurological examination:  General: NAD, well-groomed, appears stated age. Orientation: The patient is alert. Oriented to person, place, not to date Cranial nerves: There is good facial symmetry. The speech is fluent and clear. No aphasia or dysarthria. Fund of knowledge is appropriate. Recent and remote memory are impaired. Attention and concentration are reduced.  Able to name objects and repeat phrases.  Hearing is intact to conversational tone.  Sensation: Sensation is intact to light touch throughout Motor: Strength is at least antigravity x4. DTR's 2/4 in UE/LE     Movement examination: Tone: There is normal tone in the UE/LE. No cogwheeling Abnormal movements: mild resting B hand tremors.  No myoclonus.  No asterixis.   Coordination:  There is no decremation with RAM's. Normal finger to nose  Gait and Station: The patient has some difficulty arising out of a deep-seated chair without the use of the hands. Needs a walker to ambulate. The patient's stride length is short.  Gait is cautious and narrow.    Thank you for allowing [place] the opportunity to participate in the care of this nice patient. Please do not hesitate to contact us for any questions or concerns.   Total time spent on today's visit was 39 minutes dedicated to this patient today, preparing to see patient, examining the patient, ordering tests  and/or medications and counseling the patient, documenting clinical information in the EHR or other health record, independently interpreting results and communicating results to the patient/family, discussing treatment and goals,  answering patient's questions and coordinating care.  Cc:  Donita Brooks, MD  Marlowe Kays 11/12/2022 12:41 PM

## 2022-11-13 ENCOUNTER — Telehealth: Payer: Self-pay

## 2022-11-13 DIAGNOSIS — I480 Paroxysmal atrial fibrillation: Secondary | ICD-10-CM | POA: Diagnosis not present

## 2022-11-13 DIAGNOSIS — E119 Type 2 diabetes mellitus without complications: Secondary | ICD-10-CM | POA: Diagnosis not present

## 2022-11-13 DIAGNOSIS — N400 Enlarged prostate without lower urinary tract symptoms: Secondary | ICD-10-CM | POA: Diagnosis not present

## 2022-11-13 DIAGNOSIS — R609 Edema, unspecified: Secondary | ICD-10-CM | POA: Diagnosis not present

## 2022-11-13 DIAGNOSIS — J449 Chronic obstructive pulmonary disease, unspecified: Secondary | ICD-10-CM | POA: Diagnosis not present

## 2022-11-13 DIAGNOSIS — S41111D Laceration without foreign body of right upper arm, subsequent encounter: Secondary | ICD-10-CM | POA: Diagnosis not present

## 2022-11-13 NOTE — Telephone Encounter (Signed)
Ramie NP with [facility] called in to inform pcp of pt's home visit. NP stated that pt had a fall yesterday and has quite a few skin tears on his right upper arm around the elbow area. NP instructed nurse that was there with pt to give some wound for the skin tears. NP recommended pt schedule a f/u with pcp in 3 to 5 days. NP noted that pt has some supra pubic tenderness, urination difficulty at times. NP instructed the wife how to access pt's bladder when pt is having trouble with urination. Pt also had a urine test that was negative for UTI. Pt's wife was also instructed on toileting pt every 2 hrs. Bandages were also addressed on pt's legs. Pt is having some swelling in legs with a pitting of 3+. NP stated pt  would also like to discuss this med being stopped for pt: doxazosin. Please advise.

## 2022-11-14 ENCOUNTER — Encounter: Payer: Self-pay | Admitting: Family Medicine

## 2022-11-14 ENCOUNTER — Ambulatory Visit (INDEPENDENT_AMBULATORY_CARE_PROVIDER_SITE_OTHER): Payer: PPO | Admitting: Family Medicine

## 2022-11-14 VITALS — BP 132/70 | HR 60 | Temp 97.7°F | Ht 66.0 in | Wt 180.8 lb

## 2022-11-14 DIAGNOSIS — S51011A Laceration without foreign body of right elbow, initial encounter: Secondary | ICD-10-CM | POA: Diagnosis not present

## 2022-11-14 NOTE — Progress Notes (Signed)
Subjective:    Patient ID: [name], male    DOB: 1933-02-15, 89 y.o.   MRN: [id number]   Patient is a very pleasant 89 year old Caucasian gentleman who presents today with frequent falls.  This is his third fall in the last few months.  He lost his balance in the kitchen and struck his right arm against a cabinet.  He discovered a skin tear to the dorsum of his right forearm from the lateral epicondyle down to his mid forearm.  There is no erythema or evidence of cellulitis.  This occurred a few days ago.  There is no active bleeding.  The skin tear is very narrow.  Most of the length is 1 to 1.5 cm in width.  Past Medical History:  Diagnosis Date   Acute blood loss anemia    Acute upper respiratory infections of unspecified site 11/04/2012   AKI (acute kidney injury) (HCC)    Alzheimer disease (HCC)    Benign prostatic hyperplasia    CAD (coronary artery disease)    s/p cypher DES to pLAD 6/08; normal LVF;  ETT-Myoview 2009: no ischemia    Clavicle fracture 08/10/2019   Closed displaced fracture of phalanx of left thumb, sequela 08/10/2019   Coronary atherosclerosis of native coronary artery 03/08/2008   Dizziness 03/08/2020   Eosinophilia    Essential hypertension    Fracture of multiple ribs with pain 08/10/2019   Hematoma 11/04/2012   History of multiple falls    History of SAH (subarachnoid hemorrhage) 07/14/2019   Hyperlipidemia type IIB / III 03/08/2008   Itching of ear 07/14/2021   Major neurocognitive disorder due to possible Alzheimer's disease, without behavioral disturbance (HCC) 02/29/2020   MI (myocardial infarction) (HCC)    Moderate persistent asthma    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   Imaging revealed [facility]   Multiple trauma    Nocturnal leg cramps 11/23/2012   Orthostatic hypotension 03/08/2008   Otalgia of left ear 05/11/2021   Otorrhagia of left ear 03/30/2021   Pain in joint, shoulder region 11/23/2012    Stage 3b chronic kidney disease (HCC)    Thrombocytopenia (HCC)    Trigeminal neuralgia    Type 2 diabetes mellitus (HCC)    Past Surgical History:  Procedure Laterality Date   APPENDECTOMY     CARDIAC CATHETERIZATION  07/30/2006   CORONARY ANGIOPLASTY WITH STENT PLACEMENT   CARDIAC CATHETERIZATION     CATARACT EXTRACTION Right 03/2022   CATARACT EXTRACTION Left 04/2022   EXPLORATORY LAPAROTOMY     age 56   EXPLORATORY LAPAROTOMY     IR THORACENTESIS ASP PLEURAL SPACE W/IMG GUIDE  10/05/2019   Current Outpatient Medications on File Prior to Visit  Medication Sig Dispense Refill   albuterol (PROAIR HFA) 108 (90 Base) MCG/ACT inhaler INHALE 2 PUFFS INTO THE LUNGS EVERY 6 HOURS AS NEEDED FOR WHEEZING OR SHORTNESS OF BREATH (Patient taking differently: Inhale 2 puffs into the lungs every 6 (six) hours as needed for wheezing or shortness of breath. INHALE 2 PUFFS INTO THE LUNGS EVERY 6 HOURS AS NEEDED FOR WHEEZING OR SHORTNESS OF BREATH) 6.7 g 0   amiodarone (PACERONE) 200 MG tablet Take 1 tablet (200 mg total) by mouth daily. 90 tablet 3   amLODipine (NORVASC) 5 MG tablet Take 1 tablet (5 mg total) by mouth daily. 90 tablet 3   aspirin EC 81 MG tablet Take 1 tablet (81 mg total) by mouth daily. Swallow whole. 30 tablet 12  atorvastatin (LIPITOR) 20 MG tablet TAKE 1 TABLET BY MOUTH EVERY DAY 90 tablet 1   empagliflozin (JARDIANCE) 10 MG TABS tablet TAKE 1 TABLET BY MOUTH DAILY BEFORE BREAKFAST. 90 tablet 0   escitalopram (LEXAPRO) 10 MG tablet TAKE 1 TABLET BY MOUTH EVERY DAY 90 tablet 0   ezetimibe (ZETIA) 10 MG tablet TAKE 1 TABLET BY MOUTH EVERY DAY 90 tablet 3   fluticasone furoate-vilanterol (BREO ELLIPTA) 200-25 MCG/ACT AEPB INHALE 1 PUFF DAILY *NEED APPT* (Patient taking differently: Inhale 1 puff into the lungs daily. INHALE 1 PUFF DAILY *NEED APPT*) 60 each 11   furosemide (LASIX) 40 MG tablet Take 1 tablet (40 mg total) by mouth every other day. (Patient taking differently: Take 40  mg by mouth as needed for edema or fluid.) 45 tablet 3   loratadine (CLARITIN) 10 MG tablet Take 10 mg by mouth daily as needed for allergies or rhinitis.      meclizine (ANTIVERT) 12.5 MG tablet Take 1 tablet (12.5 mg total) by mouth 3 (three) times daily as needed for dizziness. 15 tablet 0   memantine (NAMENDA) 5 MG tablet TAKE 1 TABLET BY MOUTH EVERYDAY AT BEDTIME 90 tablet 1   Multiple Vitamin (MULTIVITAMIN WITH MINERALS) TABS tablet Take 1 tablet by mouth daily.     No current facility-administered medications on file prior to visit.   Allergies  Allergen Reactions   Gabapentin Other (See Comments)    Visual changes and confusion- "I went blind while I was driving"   Social History   Socioeconomic History   Marital status: Married    Spouse name: Clydie Braun   Number of children: 1   Years of education: 20   Highest education level: Patent examiner History   Occupation: retired professor    Comment: History    Occupation: missionary  Tobacco Use   Smoking status: Never   Smokeless tobacco: Never  Advertising account planner   Vaping status: Never Used  Substance and Sexual Activity   Alcohol use: Not Currently   Drug use: Never   Sexual activity: Yes    Partners: Female  Other Topics Concern   Not on file  Social History Narrative   ** Merged History Encounter **       Lives with his wife (second marriage in 2015, first marriage ended when his wife died alzheimer's disease). Since his remarriage, his adult daughter doesn't speak with him.      Right handed   Social Determinants of Health   Financial Resource Strain: Low Risk  (02/09/2022)   Overall Financial Resource Strain (CARDIA)    Difficulty of Paying Living Expenses: Not hard at all  Food Insecurity: No Food Insecurity (05/09/2022)   Hunger Vital Sign    Worried About Running Out of Food in the Last Year: Never true    Ran Out of Food in the Last Year: Never true  Transportation Needs: No Transportation Needs (05/21/2022)    PRAPARE - Administrator, Civil Service (Medical): No    Lack of Transportation (Non-Medical): No  Physical Activity: Not on file  Stress: Not on file  Social Connections: Not on file  Intimate Partner Violence: Not At Risk (05/05/2022)   Humiliation, Afraid, Rape, and Kick questionnaire    Fear of Current or Ex-Partner: No    Emotionally Abused: No    Physically Abused: No    Sexually Abused: No   Family History  Problem Relation Age of Onset   Stomach cancer Mother  Memory loss Mother    Heart disease Father    Coronary artery disease Father 35   Heart attack Father    Heart disease Brother    Aortic aneurysm Brother    Colon cancer Brother    Aortic aneurysm Brother    Colon cancer Brother    Arthritis Daughter        rheumatoid   Coronary artery disease Brother    Esophageal cancer Neg Hx    Rectal cancer Neg Hx      Review of Systems  All other systems reviewed and are negative.      Objective:   Physical Exam Vitals reviewed.  Constitutional:      General: He is not in acute distress.    Appearance: He is well-developed. He is not diaphoretic.  Neck:     Thyroid: No thyromegaly.     Vascular: No carotid bruit or JVD.     Trachea: No tracheal deviation.  Cardiovascular:     Rate and Rhythm: Normal rate and regular rhythm.     Heart sounds: Normal heart sounds. No murmur heard.    No friction rub. No gallop.  Pulmonary:     Effort: Pulmonary effort is normal. No accessory muscle usage, prolonged expiration or respiratory distress.     Breath sounds: No stridor. No wheezing, rhonchi or rales.  Abdominal:     General: Bowel sounds are normal.     Palpations: Abdomen is soft.  Musculoskeletal:       Arms:     Cervical back: Neck supple. No erythema, rigidity, torticollis or crepitus. No pain with movement, spinous process tenderness or muscular tenderness. Normal range of motion.     Right lower leg: No edema.     Left lower leg: No  edema.  Lymphadenopathy:     Cervical: No cervical adenopathy.  Skin:    Findings: Laceration and wound present. No erythema.  Neurological:     Mental Status: He is alert.     Motor: No abnormal muscle tone.     Deep Tendon Reflexes: Reflexes are normal and symmetric.           Assessment & Plan:  Skin tear of right elbow without complication, initial encounter I dressed the skin tear by coating the wound with Neosporin, covering it with nonadherent gauze, and then wrapping the forearm Coban.  I recommended performing daily dressing changes until healed.  Anticipate 10 to 14 days to heal.  Recheck if no better in 1 week or sooner if worsening.  We also discussed fall prevention strategies.  Recommended the patient begin using a 4-prong cane as a minimum when ambulating around the home.  At present he is only using a walker when he leaves the house.  Recommended that he drink more fluids to avoid dehydration.  Blood pressure is acceptable.

## 2022-11-15 ENCOUNTER — Ambulatory Visit: Payer: Self-pay | Admitting: *Deleted

## 2022-11-15 DIAGNOSIS — S41111D Laceration without foreign body of right upper arm, subsequent encounter: Secondary | ICD-10-CM | POA: Diagnosis not present

## 2022-11-15 DIAGNOSIS — Z87898 Personal history of other specified conditions: Secondary | ICD-10-CM | POA: Diagnosis not present

## 2022-11-15 NOTE — Patient Outreach (Signed)
Care Coordination   Follow Up Visit Note   11/15/2022 Name: [name] MRN: [id number] DOB: Apr 09, 1933  [name] is a 89 y.o. year old male who sees Pickard, Priscille Heidelberg, MD for primary care. I spoke with  [name] wife of [name] by phone today.  What matters to the patients health and wellness today?  Fall recently/2 days ago and injury to forearm  Seen by pcp on 11/14/22 site remains covered Skin is very thin. This fall assessed to be related to age, medicine (eye drops) & mechanical (walking on his way to bed with [name] behind him ) Neurology seen and no noted progression  cognitively   Home health visiting ended a week ago but neurology will re order after 30 days This services helps to monitor Mr [name] for decrease mobility  [name] encourages him to continue the exercises taught     Goals Addressed             This Visit's Progress    Manage home safety, memory concerns- care coordination services   Not on track    Decrease home falls- [name] on 11/12/22 going to bed with wife behind him Encouragement to continue exercises taught by home health PT   Interventions Today    Flowsheet Row Most Recent Value  Chronic Disease   Chronic disease during today's visit Other  [fall dizziness from balance injury to arm Torso]  General Interventions   General Interventions Discussed/Reviewed General Interventions Reviewed, Durable Medical Equipment (DME)  Durable Medical Equipment (DME) Other, Wheelchair, Bed side commode  [has a cane, urinal,]  Wheelchair Standard  PCP/Specialist Visits Compliance with follow-up visit  Exercise Interventions   Exercise Discussed/Reviewed Exercise Reviewed, Physical Activity, Assistive device use and maintanence  Physical Activity Discussed/Reviewed Physical Activity Reviewed, Home Exercise Program (HEP)  [seen by home health PT every other 30 days]  Education Interventions   Education Provided Provided  Education  Provided Verbal Education On Exercise  Mental Health Interventions   Mental Health Discussed/Reviewed Mental Health Reviewed, Coping Strategies  Pharmacy Interventions   Pharmacy Dicussed/Reviewed Pharmacy Topics Reviewed, Medications and their functions  Safety Interventions   Safety Discussed/Reviewed Safety Reviewed, Fall Risk, Home Safety  Home Safety Assistive Devices      Interventions Today    Flowsheet Row Most Recent Value  Chronic Disease   Chronic disease during today's visit Other  [fall dizziness from balance injury to arm Torso]  General Interventions   General Interventions Discussed/Reviewed General Interventions Reviewed, Durable Medical Equipment (DME)  Durable Medical Equipment (DME) Other, Wheelchair, Bed side commode  [has a cane, urinal,]  Wheelchair Standard  PCP/Specialist Visits Compliance with follow-up visit  Exercise Interventions   Exercise Discussed/Reviewed Exercise Reviewed, Physical Activity, Assistive device use and maintanence  Physical Activity Discussed/Reviewed Physical Activity Reviewed, Home Exercise Program (HEP)  [seen by home health PT every other 30 days]  Education Interventions   Education Provided Provided Education  Provided Verbal Education On Exercise  Mental Health Interventions   Mental Health Discussed/Reviewed Mental Health Reviewed, Coping Strategies  Pharmacy Interventions   Pharmacy Dicussed/Reviewed Pharmacy Topics Reviewed, Medications and their functions  Safety Interventions   Safety Discussed/Reviewed Safety Reviewed, Fall Risk, Home Safety  Home Safety Assistive Devices                SDOH assessments and interventions completed:  No     Care Coordination Interventions:  Yes, provided   Follow up plan: Follow up call  scheduled for 12/18/22    Encounter Outcome:  Patient Visit Completed   Cala Bradford L. Noelle Penner, RN, BSN, [facility]  VBCI Care Management Coordinator  [phone]  Fax: [phone]

## 2022-11-15 NOTE — Patient Instructions (Signed)
Visit Information  Thank you for taking time to visit with me today. Please don't hesitate to contact me if I can be of assistance to you.   Following are the goals we discussed today:   Goals Addressed             This Visit's Progress    Manage home safety, memory concerns- care coordination services   Not on track    Decrease home falls- Larey Seat on 11/12/22 going to bed with wife behind him Encouragement to continue exercises taught by home health PT   Interventions Today    Flowsheet Row Most Recent Value  Chronic Disease   Chronic disease during today's visit Other  [fall dizziness from balance injury to arm Torso]  General Interventions   General Interventions Discussed/Reviewed General Interventions Reviewed, Durable Medical Equipment (DME)  Durable Medical Equipment (DME) Other, Wheelchair, Bed side commode  [has a cane, urinal,]  Wheelchair Standard  PCP/Specialist Visits Compliance with follow-up visit  Exercise Interventions   Exercise Discussed/Reviewed Exercise Reviewed, Physical Activity, Assistive device use and maintanence  Physical Activity Discussed/Reviewed Physical Activity Reviewed, Home Exercise Program (HEP)  [seen by home health PT every other 30 days]  Education Interventions   Education Provided Provided Education  Provided Verbal Education On Exercise  Mental Health Interventions   Mental Health Discussed/Reviewed Mental Health Reviewed, Coping Strategies  Pharmacy Interventions   Pharmacy Dicussed/Reviewed Pharmacy Topics Reviewed, Medications and their functions  Safety Interventions   Safety Discussed/Reviewed Safety Reviewed, Fall Risk, Home Safety  Home Safety Assistive Devices      Interventions Today    Flowsheet Row Most Recent Value  Chronic Disease   Chronic disease during today's visit Other  [fall dizziness from balance injury to arm Torso]  General Interventions   General Interventions Discussed/Reviewed General Interventions  Reviewed, Durable Medical Equipment (DME)  Durable Medical Equipment (DME) Other, Wheelchair, Bed side commode  [has a cane, urinal,]  Wheelchair Standard  PCP/Specialist Visits Compliance with follow-up visit  Exercise Interventions   Exercise Discussed/Reviewed Exercise Reviewed, Physical Activity, Assistive device use and maintanence  Physical Activity Discussed/Reviewed Physical Activity Reviewed, Home Exercise Program (HEP)  [seen by home health PT every other 30 days]  Education Interventions   Education Provided Provided Education  Provided Verbal Education On Exercise  Mental Health Interventions   Mental Health Discussed/Reviewed Mental Health Reviewed, Coping Strategies  Pharmacy Interventions   Pharmacy Dicussed/Reviewed Pharmacy Topics Reviewed, Medications and their functions  Safety Interventions   Safety Discussed/Reviewed Safety Reviewed, Fall Risk, Home Safety  Home Safety Assistive Devices                Our next appointment is by telephone on 12/18/22 at 1045  Please call the care guide team at [phone] if you need to cancel or reschedule your appointment.   If you are experiencing a Mental Health or Behavioral Health Crisis or need someone to talk to, please call the Suicide and Crisis Lifeline: 988 call the [place] National Suicide Prevention Lifeline: 979-178-8454 or TTY: 4035937374 TTY 3097887757) to talk to a trained counselor call 1-800-273-TALK (toll free, 24 hour hotline) call the [facility]: (605) 456-3040 call 911   Patient verbalizes understanding of instructions and care plan provided today and agrees to view in MyChart. Active MyChart status and patient understanding of how to access instructions and care plan via MyChart confirmed with patient.     The patient has been provided with contact information for the care management team and  has been advised to call with any health related questions or concerns.    [name]  L. Noelle Penner, RN, BSN, [facility]  VBCI Care Management Coordinator  [phone]  Fax: [phone]

## 2022-11-20 ENCOUNTER — Other Ambulatory Visit: Payer: Self-pay | Admitting: Physician Assistant

## 2022-11-20 DIAGNOSIS — F0390 Unspecified dementia without behavioral disturbance: Secondary | ICD-10-CM

## 2022-11-29 ENCOUNTER — Encounter: Payer: Self-pay | Admitting: Podiatry

## 2022-11-29 ENCOUNTER — Ambulatory Visit: Payer: PPO | Admitting: Podiatry

## 2022-11-29 VITALS — Ht 66.0 in | Wt 180.8 lb

## 2022-11-29 DIAGNOSIS — B351 Tinea unguium: Secondary | ICD-10-CM

## 2022-11-29 DIAGNOSIS — E119 Type 2 diabetes mellitus without complications: Secondary | ICD-10-CM

## 2022-11-29 DIAGNOSIS — M79676 Pain in unspecified toe(s): Secondary | ICD-10-CM | POA: Diagnosis not present

## 2022-11-29 NOTE — Progress Notes (Signed)
This patient returns to my office for at risk foot care.  This patient requires this care by a professional since this patient will be at risk due to having diabetes. This patient is unable to cut nails himself since the patient cannot reach his nails.These nails are painful walking and wearing shoes.  This patient presents for at risk foot care today.  General Appearance  Alert, conversant and in no acute stress.  Vascular  Dorsalis pedis and posterior tibial  pulses are palpable  bilaterally.  Capillary return is within normal limits  bilaterally. Temperature is within normal limits  bilaterally.  Neurologic  Senn-Weinstein monofilament wire test diminished   bilaterally. Muscle power within normal limits bilaterally.  Nails Thick disfigured discolored nails with subungual debris  from hallux to fifth toes bilaterally. No evidence of bacterial infection or drainage bilaterally.  Orthopedic  No limitations of motion  feet .  No crepitus or effusions noted.  HAV  B/L.  Skin  normotropic skin with no porokeratosis noted bilaterally.  No signs of infections or ulcers noted.     Onychomycosis  Pain in right toes  Pain in left toes  Consent was obtained for treatment procedures.   Mechanical debridement of nails 1-5  bilaterally performed with a nail nipper.  Filed with dremel without incident. Patient qualifies for diabetic shoes due to DPN and Hallux valgus  B/L.   Return office visit    3 months                  Told patient to return for periodic foot care and evaluation due to potential at risk complications.   Helane Gunther DPM

## 2022-12-05 ENCOUNTER — Other Ambulatory Visit: Payer: Self-pay

## 2022-12-05 ENCOUNTER — Encounter: Payer: Self-pay | Admitting: Physician Assistant

## 2022-12-05 DIAGNOSIS — R296 Repeated falls: Secondary | ICD-10-CM

## 2022-12-05 DIAGNOSIS — R2681 Unsteadiness on feet: Secondary | ICD-10-CM

## 2022-12-08 DIAGNOSIS — Z87820 Personal history of traumatic brain injury: Secondary | ICD-10-CM | POA: Diagnosis not present

## 2022-12-08 DIAGNOSIS — N1832 Chronic kidney disease, stage 3b: Secondary | ICD-10-CM | POA: Diagnosis not present

## 2022-12-08 DIAGNOSIS — I2510 Atherosclerotic heart disease of native coronary artery without angina pectoris: Secondary | ICD-10-CM | POA: Diagnosis not present

## 2022-12-08 DIAGNOSIS — D631 Anemia in chronic kidney disease: Secondary | ICD-10-CM | POA: Diagnosis not present

## 2022-12-08 DIAGNOSIS — G500 Trigeminal neuralgia: Secondary | ICD-10-CM | POA: Diagnosis not present

## 2022-12-08 DIAGNOSIS — F02818 Dementia in other diseases classified elsewhere, unspecified severity, with other behavioral disturbance: Secondary | ICD-10-CM | POA: Diagnosis not present

## 2022-12-08 DIAGNOSIS — M4802 Spinal stenosis, cervical region: Secondary | ICD-10-CM | POA: Diagnosis not present

## 2022-12-08 DIAGNOSIS — J4540 Moderate persistent asthma, uncomplicated: Secondary | ICD-10-CM | POA: Diagnosis not present

## 2022-12-08 DIAGNOSIS — E1122 Type 2 diabetes mellitus with diabetic chronic kidney disease: Secondary | ICD-10-CM | POA: Diagnosis not present

## 2022-12-08 DIAGNOSIS — N400 Enlarged prostate without lower urinary tract symptoms: Secondary | ICD-10-CM | POA: Diagnosis not present

## 2022-12-08 DIAGNOSIS — E7849 Other hyperlipidemia: Secondary | ICD-10-CM | POA: Diagnosis not present

## 2022-12-08 DIAGNOSIS — Z7982 Long term (current) use of aspirin: Secondary | ICD-10-CM | POA: Diagnosis not present

## 2022-12-08 DIAGNOSIS — F0282 Dementia in other diseases classified elsewhere, unspecified severity, with psychotic disturbance: Secondary | ICD-10-CM | POA: Diagnosis not present

## 2022-12-08 DIAGNOSIS — I951 Orthostatic hypotension: Secondary | ICD-10-CM | POA: Diagnosis not present

## 2022-12-08 DIAGNOSIS — Z8781 Personal history of (healed) traumatic fracture: Secondary | ICD-10-CM | POA: Diagnosis not present

## 2022-12-08 DIAGNOSIS — I252 Old myocardial infarction: Secondary | ICD-10-CM | POA: Diagnosis not present

## 2022-12-08 DIAGNOSIS — J4489 Other specified chronic obstructive pulmonary disease: Secondary | ICD-10-CM | POA: Diagnosis not present

## 2022-12-08 DIAGNOSIS — I129 Hypertensive chronic kidney disease with stage 1 through stage 4 chronic kidney disease, or unspecified chronic kidney disease: Secondary | ICD-10-CM | POA: Diagnosis not present

## 2022-12-08 DIAGNOSIS — M47812 Spondylosis without myelopathy or radiculopathy, cervical region: Secondary | ICD-10-CM | POA: Diagnosis not present

## 2022-12-08 DIAGNOSIS — D696 Thrombocytopenia, unspecified: Secondary | ICD-10-CM | POA: Diagnosis not present

## 2022-12-08 DIAGNOSIS — G309 Alzheimer's disease, unspecified: Secondary | ICD-10-CM | POA: Diagnosis not present

## 2022-12-08 DIAGNOSIS — Z7951 Long term (current) use of inhaled steroids: Secondary | ICD-10-CM | POA: Diagnosis not present

## 2022-12-08 DIAGNOSIS — Z7984 Long term (current) use of oral hypoglycemic drugs: Secondary | ICD-10-CM | POA: Diagnosis not present

## 2022-12-08 DIAGNOSIS — M4312 Spondylolisthesis, cervical region: Secondary | ICD-10-CM | POA: Diagnosis not present

## 2022-12-18 ENCOUNTER — Ambulatory Visit: Payer: Self-pay | Admitting: *Deleted

## 2022-12-18 NOTE — Patient Outreach (Signed)
Care Coordination   Follow Up Visit Note   12/19/2022 Name: [name] MRN: [id number] DOB: 09-Jul-1933  [name] is a 89 y.o. year old male who sees Pickard, Priscille Heidelberg, MD for primary care. I spoke with Clydie Braun, wife of Jabe Cyndi Lennert by phone today.  What matters to the patients health and wellness today?  Fall prevention, history of multiple falls Recent fall backwards in church on Sunday during communion Fall on 12/17/22 Monday when he bent down and toppled over forward He is attempting to get use to counting before and when standing to prevent from moving too quickly   Today more pain is noticed after he fell 12/17/22 into a bird cage on the right and hit the left side of his body/rib cage He has "a fifty cent size wound" on left side His physical therapist (PT) did not view it yesterday 12/18/22 but returns on 12/20/22, Thursday Slight bruise on back   Pain with exhaling is decreasing   They were able to find additional help from a gentleman for home care via a [facility] program- will get a voucher   Mrs Bozard is feeling better and seeing her providers.    Goals Addressed             This Visit's Progress    Manage home safety, memory concerns- care coordination services   Not on track    Decrease home falls- Larey Seat on 11/12/22 going to bed with wife behind him, fell backwards during communion on 12/16/22 & 12/17/22 when bending forward- poor progression toward goal  Encouragement to continue exercises taught by home health PT   Interventions Today    Flowsheet Row Most Recent Value  Chronic Disease   Chronic disease during today's visit Other  [fall prevention]  General Interventions   General Interventions Discussed/Reviewed General Interventions Reviewed, Durable Medical Equipment (DME), Doctor Visits, Communication with  Doctor Visits Discussed/Reviewed Doctor Visits Reviewed, PCP  Durable Medical Equipment (DME) Walker, Bed side commode,  Wheelchair, Other  Wheelchair Standard  PCP/Specialist Visits Compliance with follow-up visit  Communication with PCP/Specialists  [updated pcp via FYI in basket message of falls on 12/16/22 backwards during church communion & 12/17/22 while bending forward]  Exercise Interventions   Exercise Discussed/Reviewed Exercise Reviewed, Physical Activity, Assistive device use and maintanence  Physical Activity Discussed/Reviewed Physical Activity Reviewed, Home Exercise Program (HEP)  [home therapy continues]  Education Interventions   Education Provided Provided Education  [wound care, monitoring for wound infection]  Provided Verbal Education On Other, Mental Health/Coping with Illness, Medication, When to see the doctor  Mental Health Interventions   Mental Health Discussed/Reviewed Mental Health Reviewed, Coping Strategies  Nutrition Interventions   Nutrition Discussed/Reviewed Nutrition Reviewed, Increasing proteins, Fluid intake  Pharmacy Interventions   Pharmacy Dicussed/Reviewed Pharmacy Topics Reviewed, Medications and their functions  Safety Interventions   Safety Discussed/Reviewed Safety Reviewed, Home Safety, Fall Risk  Home Safety Assistive Devices      Interventions Today    Flowsheet Row Most Recent Value  Chronic Disease   Chronic disease during today's visit Other  [fall prevention]  General Interventions   General Interventions Discussed/Reviewed General Interventions Reviewed, Durable Medical Equipment (DME), Doctor Visits, Communication with  Doctor Visits Discussed/Reviewed Doctor Visits Reviewed, PCP  Durable Medical Equipment (DME) Dan Humphreys, Bed side commode, Wheelchair, Other  Wheelchair Standard  PCP/Specialist Visits Compliance with follow-up visit  Communication with PCP/Specialists  [updated pcp via FYI in basket message of falls on 12/16/22 backwards during church communion &  12/17/22 while bending forward]  Exercise Interventions   Exercise Discussed/Reviewed  Exercise Reviewed, Physical Activity, Assistive device use and maintanence  Physical Activity Discussed/Reviewed Physical Activity Reviewed, Home Exercise Program (HEP)  [home therapy continues]  Education Interventions   Education Provided Provided Education  [wound care, monitoring for wound infection]  Provided Verbal Education On Other, Mental Health/Coping with Illness, Medication, When to see the doctor  Mental Health Interventions   Mental Health Discussed/Reviewed Mental Health Reviewed, Coping Strategies  Nutrition Interventions   Nutrition Discussed/Reviewed Nutrition Reviewed, Increasing proteins, Fluid intake  Pharmacy Interventions   Pharmacy Dicussed/Reviewed Pharmacy Topics Reviewed, Medications and their functions  Safety Interventions   Safety Discussed/Reviewed Safety Reviewed, Home Safety, Fall Risk  Home Safety Assistive Devices                SDOH assessments and interventions completed:  No     Care Coordination Interventions:  Yes, provided   Follow up plan: Follow up call scheduled for 01/17/23    Encounter Outcome:  Patient Visit Completed   Cala Bradford L. Noelle Penner, RN, BSN, [facility]  VBCI Care Management Coordinator  [phone]  Fax: [phone]

## 2022-12-19 NOTE — Patient Instructions (Addendum)
Visit Information  Thank you for taking time to visit with me today. Please don't hesitate to contact me if I can be of assistance to you.   Following are the goals we discussed today:   Goals Addressed             This Visit's Progress    Manage home safety, memory concerns- care coordination services   Not on track    Decrease home falls- Larey Seat on 11/12/22 going to bed with wife behind him, fell backwards during communion on 12/16/22 & 12/17/22 when bending forward- poor progression toward goal  Encouragement to continue exercises taught by home health PT   Interventions Today    Flowsheet Row Most Recent Value  Chronic Disease   Chronic disease during today's visit Other  [fall prevention]  General Interventions   General Interventions Discussed/Reviewed General Interventions Reviewed, Durable Medical Equipment (DME), Doctor Visits, Communication with  Doctor Visits Discussed/Reviewed Doctor Visits Reviewed, PCP  Durable Medical Equipment (DME) Dan Humphreys, Bed side commode, Wheelchair, Other  Wheelchair Standard  PCP/Specialist Visits Compliance with follow-up visit  Communication with PCP/Specialists  [updated pcp via FYI in basket message of falls on 12/16/22 backwards during church communion & 12/17/22 while bending forward]  Exercise Interventions   Exercise Discussed/Reviewed Exercise Reviewed, Physical Activity, Assistive device use and maintanence  Physical Activity Discussed/Reviewed Physical Activity Reviewed, Home Exercise Program (HEP)  [home therapy continues]  Education Interventions   Education Provided Provided Education  [wound care, monitoring for wound infection]  Provided Verbal Education On Other, Mental Health/Coping with Illness, Medication, When to see the doctor  Mental Health Interventions   Mental Health Discussed/Reviewed Mental Health Reviewed, Coping Strategies  Nutrition Interventions   Nutrition Discussed/Reviewed Nutrition Reviewed, Increasing  proteins, Fluid intake  Pharmacy Interventions   Pharmacy Dicussed/Reviewed Pharmacy Topics Reviewed, Medications and their functions  Safety Interventions   Safety Discussed/Reviewed Safety Reviewed, Home Safety, Fall Risk  Home Safety Assistive Devices      Interventions Today    Flowsheet Row Most Recent Value  Chronic Disease   Chronic disease during today's visit Other  [fall prevention]  General Interventions   General Interventions Discussed/Reviewed General Interventions Reviewed, Durable Medical Equipment (DME), Doctor Visits, Communication with  Doctor Visits Discussed/Reviewed Doctor Visits Reviewed, PCP  Durable Medical Equipment (DME) Walker, Bed side commode, Wheelchair, Other  Wheelchair Standard  PCP/Specialist Visits Compliance with follow-up visit  Communication with PCP/Specialists  [updated pcp via FYI in basket message of falls on 12/16/22 backwards during church communion & 12/17/22 while bending forward]  Exercise Interventions   Exercise Discussed/Reviewed Exercise Reviewed, Physical Activity, Assistive device use and maintanence  Physical Activity Discussed/Reviewed Physical Activity Reviewed, Home Exercise Program (HEP)  [home therapy continues]  Education Interventions   Education Provided Provided Education  [wound care, monitoring for wound infection]  Provided Verbal Education On Other, Mental Health/Coping with Illness, Medication, When to see the doctor  Mental Health Interventions   Mental Health Discussed/Reviewed Mental Health Reviewed, Coping Strategies  Nutrition Interventions   Nutrition Discussed/Reviewed Nutrition Reviewed, Increasing proteins, Fluid intake  Pharmacy Interventions   Pharmacy Dicussed/Reviewed Pharmacy Topics Reviewed, Medications and their functions  Safety Interventions   Safety Discussed/Reviewed Safety Reviewed, Home Safety, Fall Risk  Home Safety Assistive Devices                Our next appointment is by  telephone on 01/17/23 at 1030  Please call the care guide team at [phone] if you need to cancel or reschedule  your appointment.   If you are experiencing a Mental Health or Behavioral Health Crisis or need someone to talk to, please    Patient verbalizes understanding of instructions and care plan provided today and agrees to view in MyChart. Active MyChart status and patient understanding of how to access instructions and care plan via MyChart confirmed with patient.     The patient has been provided with contact information for the care management team and has been advised to call with any health related questions or concerns.   Kathreen Cosier. Noelle Penner, RN, BSN, [facility]  VBCI Care Management Coordinator  [phone]  Fax: [phone]

## 2022-12-24 ENCOUNTER — Encounter: Payer: Self-pay | Admitting: Family Medicine

## 2022-12-31 ENCOUNTER — Other Ambulatory Visit: Payer: Self-pay

## 2022-12-31 ENCOUNTER — Telehealth: Payer: Self-pay

## 2022-12-31 DIAGNOSIS — E119 Type 2 diabetes mellitus without complications: Secondary | ICD-10-CM

## 2022-12-31 MED ORDER — EMPAGLIFLOZIN 10 MG PO TABS: 10.0000 mg | ORAL_TABLET | Freq: Every day | ORAL | 0 refills | Status: DC

## 2022-12-31 NOTE — Telephone Encounter (Signed)
Prescription Request  12/31/2022  LOV: 11/14/22  What is the name of the medication or equipment? empagliflozin (JARDIANCE) 10 MG TABS tablet [[id number]]   Have you contacted your pharmacy to request a refill? Yes   Which pharmacy would you like this sent to?  [facility] [place], [facility] [phone] FAX [phone]  Patient notified that their request is being sent to the clinical staff for review and that they should receive a response within 2 business days.   Please advise at [facility] [phone]

## 2022-12-31 NOTE — Telephone Encounter (Unsigned)
Copied from CRM [id number]. Topic: Clinical - Medication Refill >> Dec 28, 2022  4:14 PM Tiffany H wrote: Most Recent Primary Care Visit:  Provider: Lynnea Ferrier T  Department[facility]  Visit Type: OFFICE VISIT  Date: 11/14/2022  Medication: Jardiance 10mg  1x   Has the patient contacted their pharmacy? Yes (Agent: If no, request that the patient contact the pharmacy for the refill. If patient does not wish to contact the pharmacy document the reason why and proceed with request.) (Agent: If yes, when and what did the pharmacy advise?) Unable to reach pharmacy.   Is this the correct pharmacy for this prescription? Yes If no, delete pharmacy and type the correct one.  This is the patient's preferred pharmacy:  [id number] [place], [place] - [address] AT [address] OF [facility] [address] [place] [address] Phone: [phone] Fax: [phone]   Has the prescription been filled recently? No  Is the patient out of the medication? Yes  Has the patient been seen for an appointment in the last year OR does the patient have an upcoming appointment? Yes  Can we respond through MyChart? Yes  Agent: Please be advised that Rx refills may take up to 3 business days. We ask that you follow-up with your pharmacy.

## 2022-12-31 NOTE — Telephone Encounter (Signed)
Copied from CRM [id number]. Topic: Clinical - Medication Refill >> Dec 28, 2022  4:14 PM Tiffany H wrote: Most Recent Primary Care Visit:  Provider: Lynnea Ferrier T  Department: [facility]  Visit Type: OFFICE VISIT  Date: 11/14/2022  Medication: Jardiance 10mg  1x   Has the patient contacted their pharmacy? Yes (Agent: If no, request that the patient contact the pharmacy for the refill. If patient does not wish to contact the pharmacy document the reason why and proceed with request.) (Agent: If yes, when and what did the pharmacy advise?) Unable to reach pharmacy.   Is this the correct pharmacy for this prescription? Yes If no, delete pharmacy and type the correct one.  This is the patient's preferred pharmacy:  [id number] - [place], [place] - [address] AT [address] [facility] [address] [place] [address] Phone: [phone] Fax: [phone]   Has the prescription been filled recently? No  Is the patient out of the medication? Yes  Has the patient been seen for an appointment in the last year OR does the patient have an upcoming appointment? Yes  Can we respond through MyChart? Yes  Agent: Please be advised that Rx refills may take up to 3 business days. We ask that you follow-up with your pharmacy.

## 2023-01-07 ENCOUNTER — Emergency Department (HOSPITAL_BASED_OUTPATIENT_CLINIC_OR_DEPARTMENT_OTHER)
Admission: EM | Admit: 2023-01-07 | Discharge: 2023-01-08 | Disposition: A | Discharge status: Home / Self Care | Payer: PPO | Attending: Emergency Medicine | Admitting: Emergency Medicine

## 2023-01-07 ENCOUNTER — Emergency Department (HOSPITAL_BASED_OUTPATIENT_CLINIC_OR_DEPARTMENT_OTHER): Payer: PPO

## 2023-01-07 ENCOUNTER — Other Ambulatory Visit: Payer: Self-pay

## 2023-01-07 ENCOUNTER — Encounter: Payer: Self-pay | Admitting: Cardiovascular Disease

## 2023-01-07 ENCOUNTER — Encounter (HOSPITAL_BASED_OUTPATIENT_CLINIC_OR_DEPARTMENT_OTHER): Payer: Self-pay

## 2023-01-07 ENCOUNTER — Ambulatory Visit: Payer: PPO

## 2023-01-07 ENCOUNTER — Ambulatory Visit: Payer: PPO | Attending: Cardiovascular Disease | Admitting: Cardiovascular Disease

## 2023-01-07 VITALS — BP 150/70 | HR 65 | Ht 66.0 in | Wt 178.0 lb

## 2023-01-07 DIAGNOSIS — Z79899 Other long term (current) drug therapy: Secondary | ICD-10-CM

## 2023-01-07 DIAGNOSIS — S20212A Contusion of left front wall of thorax, initial encounter: Secondary | ICD-10-CM | POA: Diagnosis not present

## 2023-01-07 DIAGNOSIS — W19XXXA Unspecified fall, initial encounter: Secondary | ICD-10-CM

## 2023-01-07 DIAGNOSIS — N183 Chronic kidney disease, stage 3 unspecified: Secondary | ICD-10-CM

## 2023-01-07 DIAGNOSIS — S20219A Contusion of unspecified front wall of thorax, initial encounter: Secondary | ICD-10-CM | POA: Diagnosis not present

## 2023-01-07 DIAGNOSIS — I10 Essential (primary) hypertension: Secondary | ICD-10-CM

## 2023-01-07 DIAGNOSIS — S299XXA Unspecified injury of thorax, initial encounter: Secondary | ICD-10-CM | POA: Diagnosis not present

## 2023-01-07 DIAGNOSIS — S51812A Laceration without foreign body of left forearm, initial encounter: Secondary | ICD-10-CM | POA: Insufficient documentation

## 2023-01-07 DIAGNOSIS — F0390 Unspecified dementia without behavioral disturbance: Secondary | ICD-10-CM | POA: Insufficient documentation

## 2023-01-07 DIAGNOSIS — R0781 Pleurodynia: Secondary | ICD-10-CM | POA: Insufficient documentation

## 2023-01-07 DIAGNOSIS — N189 Chronic kidney disease, unspecified: Secondary | ICD-10-CM | POA: Insufficient documentation

## 2023-01-07 DIAGNOSIS — I2510 Atherosclerotic heart disease of native coronary artery without angina pectoris: Secondary | ICD-10-CM

## 2023-01-07 DIAGNOSIS — Z5181 Encounter for therapeutic drug level monitoring: Secondary | ICD-10-CM

## 2023-01-07 DIAGNOSIS — J90 Pleural effusion, not elsewhere classified: Secondary | ICD-10-CM | POA: Diagnosis not present

## 2023-01-07 DIAGNOSIS — Z7982 Long term (current) use of aspirin: Secondary | ICD-10-CM | POA: Insufficient documentation

## 2023-01-07 DIAGNOSIS — R918 Other nonspecific abnormal finding of lung field: Secondary | ICD-10-CM | POA: Diagnosis not present

## 2023-01-07 DIAGNOSIS — I4819 Other persistent atrial fibrillation: Secondary | ICD-10-CM | POA: Diagnosis not present

## 2023-01-07 DIAGNOSIS — Y92002 Bathroom of unspecified non-institutional (private) residence single-family (private) house as the place of occurrence of the external cause: Secondary | ICD-10-CM | POA: Insufficient documentation

## 2023-01-07 DIAGNOSIS — I672 Cerebral atherosclerosis: Secondary | ICD-10-CM | POA: Diagnosis not present

## 2023-01-07 DIAGNOSIS — I4891 Unspecified atrial fibrillation: Secondary | ICD-10-CM

## 2023-01-07 DIAGNOSIS — E782 Mixed hyperlipidemia: Secondary | ICD-10-CM | POA: Diagnosis not present

## 2023-01-07 DIAGNOSIS — W182XXA Fall in (into) shower or empty bathtub, initial encounter: Secondary | ICD-10-CM | POA: Insufficient documentation

## 2023-01-07 DIAGNOSIS — Z043 Encounter for examination and observation following other accident: Secondary | ICD-10-CM | POA: Diagnosis not present

## 2023-01-07 DIAGNOSIS — S59912A Unspecified injury of left forearm, initial encounter: Secondary | ICD-10-CM | POA: Diagnosis present

## 2023-01-07 LAB — CBC WITH DIFFERENTIAL/PLATELET
Abs Immature Granulocytes: 0.04 10*3/uL (ref 0.00–0.07)
Basophils Absolute: 0 10*3/uL (ref 0.0–0.1)
Basophils Relative: 0 %
Eosinophils Absolute: 0.3 10*3/uL (ref 0.0–0.5)
Eosinophils Relative: 5 %
HCT: 30 % — ABNORMAL LOW (ref 39.0–52.0)
Hemoglobin: 9.9 g/dL — ABNORMAL LOW (ref 13.0–17.0)
Immature Granulocytes: 1 %
Lymphocytes Relative: 9 %
Lymphs Abs: 0.6 10*3/uL — ABNORMAL LOW (ref 0.7–4.0)
MCH: 30 pg (ref 26.0–34.0)
MCHC: 33 g/dL (ref 30.0–36.0)
MCV: 90.9 fL (ref 80.0–100.0)
Monocytes Absolute: 0.7 10*3/uL (ref 0.1–1.0)
Monocytes Relative: 11 %
Neutro Abs: 4.8 10*3/uL (ref 1.7–7.7)
Neutrophils Relative %: 74 %
Platelets: 223 10*3/uL (ref 150–400)
RBC: 3.3 MIL/uL — ABNORMAL LOW (ref 4.22–5.81)
RDW: 14.7 % (ref 11.5–15.5)
WBC: 6.4 10*3/uL (ref 4.0–10.5)
nRBC: 0 % (ref 0.0–0.2)

## 2023-01-07 LAB — BASIC METABOLIC PANEL
Anion gap: 10 (ref 5–15)
BUN: 46 mg/dL — ABNORMAL HIGH (ref 8–23)
CO2: 24 mmol/L (ref 22–32)
Calcium: 8.7 mg/dL — ABNORMAL LOW (ref 8.9–10.3)
Chloride: 103 mmol/L (ref 98–111)
Creatinine, Ser: 2.51 mg/dL — ABNORMAL HIGH (ref 0.61–1.24)
GFR, Estimated: 24 mL/min — ABNORMAL LOW (ref >60)
Glucose, Bld: 142 mg/dL — ABNORMAL HIGH (ref 70–99)
Potassium: 3.9 mmol/L (ref 3.5–5.1)
Sodium: 137 mmol/L (ref 135–145)

## 2023-01-07 MED ORDER — AMIODARONE HCL 200 MG PO TABS: 100.0000 mg | ORAL_TABLET | Freq: Every day | ORAL | 3 refills | Status: AC

## 2023-01-07 MED ORDER — OXYCODONE-ACETAMINOPHEN 5-325 MG PO TABS
1.0000 | ORAL_TABLET | Freq: Once | ORAL | Status: AC
  Administered 2023-01-07: 1 via ORAL
  Filled 2023-01-07: qty 1

## 2023-01-07 MED ORDER — LIDOCAINE 5 % EX PTCH
1.0000 | MEDICATED_PATCH | CUTANEOUS | Status: DC
  Administered 2023-01-07: 1 via TRANSDERMAL
  Filled 2023-01-07: qty 1

## 2023-01-07 NOTE — Progress Notes (Signed)
Cardiology Office Note:    Date:  01/07/2023   ID:  [name], DOB Oct 01, 1933, MRN [id number]  PCP:  Donita Brooks, MD   [facility] Providers Cardiologist:  Tonny Bollman, MD     Referring MD: Donita Brooks, MD   Chief Complaint  Patient presents with   Atrial Fibrillation    History of Present Illness:    [name] is a 89 y.o. male with a hx of  coronary artery disease with history of LAD stenting in 2008.  He has also developed paroxysmal atrial fibrillation.  Comorbid conditions include mild bilateral carotid artery stenosis, hypertension, hyperlipidemia, and stage III chronic kidney disease.  Patient underwent PCI with DES to proximal LAD in 2008 with a Cypher DES. He was also noted to have moderate residual coronary disease at that time for which medical therapy was recommended.   The patient is here with his wife today.  He is a poor historian due to dementia.  She provides majority of the history.  The patient is pleasant and he has no complaints.  He has not had problems with chest pain or chest pressure.  He has had no palpitations.  She reports that he does have some shortness of breath with activity and that he has had lower extremity edema that has been treated with furosemide as needed.  He has been slowly losing weight.  No other complaints reported.  No orthopnea or PND.  Current Medications: Current Meds  Medication Sig   albuterol (PROAIR HFA) 108 (90 Base) MCG/ACT inhaler INHALE 2 PUFFS INTO THE LUNGS EVERY 6 HOURS AS NEEDED FOR WHEEZING OR SHORTNESS OF BREATH (Patient taking differently: Inhale 2 puffs into the lungs every 6 (six) hours as needed for wheezing or shortness of breath. INHALE 2 PUFFS INTO THE LUNGS EVERY 6 HOURS AS NEEDED FOR WHEEZING OR SHORTNESS OF BREATH)   amiodarone (PACERONE) 200 MG tablet Take 0.5 tablets (100 mg total) by mouth daily.   amLODipine (NORVASC) 5 MG tablet Take 1 tablet (5 mg total) by mouth  daily.   aspirin EC 81 MG tablet Take 1 tablet (81 mg total) by mouth daily. Swallow whole.   atorvastatin (LIPITOR) 20 MG tablet TAKE 1 TABLET BY MOUTH EVERY DAY   empagliflozin (JARDIANCE) 10 MG TABS tablet Take 1 tablet (10 mg total) by mouth daily before breakfast.   escitalopram (LEXAPRO) 10 MG tablet TAKE 1 TABLET BY MOUTH EVERY DAY   ezetimibe (ZETIA) 10 MG tablet TAKE 1 TABLET BY MOUTH EVERY DAY   fluticasone furoate-vilanterol (BREO ELLIPTA) 200-25 MCG/ACT AEPB INHALE 1 PUFF DAILY *NEED APPT* (Patient taking differently: Inhale 1 puff into the lungs daily. INHALE 1 PUFF DAILY *NEED APPT*)   furosemide (LASIX) 40 MG tablet Take 1 tablet (40 mg total) by mouth every other day. (Patient taking differently: Take 40 mg by mouth as needed for edema or fluid.)   loratadine (CLARITIN) 10 MG tablet Take 10 mg by mouth daily as needed for allergies or rhinitis.    memantine (NAMENDA) 5 MG tablet TAKE 1 TABLET BY MOUTH EVERYDAY AT BEDTIME   Multiple Vitamin (MULTIVITAMIN WITH MINERALS) TABS tablet Take 1 tablet by mouth daily.   [DISCONTINUED] amiodarone (PACERONE) 200 MG tablet Take 1 tablet (200 mg total) by mouth daily.     Allergies:   Gabapentin   ROS:   Please see the history of present illness.    All other systems reviewed and are negative.  EKGs/Labs/Other Studies  Reviewed:    The following studies were reviewed today: Cardiac Studies & Procedures       ECHOCARDIOGRAM  ECHOCARDIOGRAM LIMITED 04/30/2022  Narrative ECHOCARDIOGRAM LIMITED REPORT    Patient Name:   [name] Date of Exam: 04/30/2022 Medical Rec #:  [id number]          Height:       68.0 in Accession #:    [id number]         Weight:       194.2 lb Date of Birth:  Nov 02, 1933          BSA:          2.018 m Patient Age:    88 years           BP:           87/52 mmHg Patient Gender: M                  HR:           56 bpm. Exam Location:  Inpatient  Procedure: Limited Echo and Limited Color  Doppler  Indications:     Elevated Troponin  History:         Patient has prior history of Echocardiogram examinations, most recent 01/10/2022. CAD, Arrythmias:Atrial Fibrillation; Risk Factors:Hypertension, Diabetes and Dyslipidemia. CKD, stage 3.  Sonographer:     Lucendia Herrlich Referring Phys:  [id number] Oliver Pila HALL Diagnosing Phys: Arvilla Meres MD  IMPRESSIONS   1. The LV endocardium is not well visualized in any images. EF appears grossly normal.. Left ventricular ejection fraction, by estimation, is 55 to 60%. The left ventricle has normal function. Left ventricular endocardial border not optimally defined to evaluate regional wall motion. There is moderate concentric left ventricular hypertrophy. 2. Right ventricular systolic function is normal. 3. A small pericardial effusion is present. The pericardial effusion is posterior to the left ventricle. There is no evidence of cardiac tamponade. 4. Trivial mitral valve regurgitation. 5. The aortic valve is tricuspid. There is mild calcification of the aortic valve. Aortic valve regurgitation is trivial. 6. Technically limited study due to ppor sound wave transmission.  FINDINGS Left Ventricle: The LV endocardium is not well visualized in any images. EF appears grossly normal. Left ventricular ejection fraction, by estimation, is 55 to 60%. The left ventricle has normal function. Left ventricular endocardial border not optimally defined to evaluate regional wall motion. There is moderate concentric left ventricular hypertrophy.  Right Ventricle: Right ventricular systolic function is normal.  Pericardium: A small pericardial effusion is present. The pericardial effusion is posterior to the left ventricle. There is no evidence of cardiac tamponade.  Mitral Valve: Mild mitral annular calcification. Trivial mitral valve regurgitation.  Tricuspid Valve: The tricuspid valve is not well visualized. Tricuspid valve regurgitation is  trivial.  Aortic Valve: The aortic valve is tricuspid. There is mild calcification of the aortic valve. Aortic valve regurgitation is trivial.  LEFT VENTRICLE PLAX 2D LVIDd:         4.95 cm LVIDs:         3.70 cm LV PW:         1.20 cm LV IVS:        1.50 cm LVOT diam:     2.30 cm LVOT Area:     4.15 cm   LEFT ATRIUM         Index LA diam:    3.50 cm 1.73 cm/m  AORTA Ao Root diam: 3.70 cm Ao Asc  diam:  3.50 cm   SHUNTS Systemic Diam: 2.30 cm  Arvilla Meres MD Electronically signed by Arvilla Meres MD Signature Date/Time: 04/30/2022/12:41:27 PM    Final (Updated)    MONITORS  LONG TERM MONITOR (3-14 DAYS) 05/21/2022  Narrative Patch Wear Time:  7 days and 1 hours (2024-04-02T14:40:12-398 to 2024-04-09T16:26:53-0400)  Patient had a min HR of 54 bpm, max HR of 181 bpm, and avg HR of 73 bpm. Predominant underlying rhythm was Sinus Rhythm. Intermittent Bundle Branch Block was present. 4 Supraventricular Tachycardia runs occurred, the run with the fastest interval lasting 7 beats with a max rate of 146 bpm, the longest lasting 7 beats with an avg rate of 105 bpm. Atrial Fibrillation occurred (6% burden), ranging from 85-181 bpm (avg of 124 bpm), the longest lasting 4 hours 39 mins with an avg rate of 131 bpm. Isolated SVEs were rare (<1.0%), SVE Couplets were rare (<1.0%), and SVE Triplets were rare (<1.0%). Isolated VEs were rare (<1.0%), VE Couplets were rare (<1.0%), and no VE Triplets were present.  SUMMARY: The basic rhythm is normal sinus with an average HR of 73 bpm There are a few episodes of atrial fibrillation and atrial fibrillation with RVR with an overall burden of 6% No high-grade heart block or pathologic pauses There are rare PVC's without sustained ventricular arrhythmia           EKG:        Recent Labs: 04/28/2022: Magnesium 1.9 05/16/2022: B Natriuretic Peptide 261.6 07/20/2022: ALT 44; TSH 2.680 10/11/2022: BUN 38; Creatinine, Ser 2.26;  Hemoglobin 10.8; Platelets 167; Potassium 4.1; Sodium 139  Recent Lipid Panel    Component Value Date/Time   CHOL 110 04/28/2022 0122   CHOL 129 04/26/2019 0730   TRIG 39 04/28/2022 0122   HDL 50 04/28/2022 0122   HDL 58 04/26/2019 0730   CHOLHDL 2.2 04/28/2022 0122   VLDL 8 04/28/2022 0122   LDLCALC 52 04/28/2022 0122   LDLCALC 52 12/15/2021 0845     Risk Assessment/Calculations:    CHA2DS2-VASc Score = 5   This indicates a 7.2% annual risk of stroke. The patient's score is based upon: CHF History: 1 HTN History: 1 Diabetes History: 0 Stroke History: 0 Vascular Disease History: 1 Age Score: 2 Gender Score: 0           Physical Exam:    VS:  BP (!) 150/70   Pulse 65   Ht 5\' 6"  (1.676 m)   Wt 178 lb (80.7 kg)   SpO2 97%   BMI 28.73 kg/m     Wt Readings from Last 3 Encounters:  01/07/23 178 lb (80.7 kg)  11/29/22 180 lb 12.8 oz (82 kg)  11/14/22 180 lb 12.8 oz (82 kg)     GEN:  Well nourished, well developed pleasant elderly male in no acute distress HEENT: Normal NECK: No JVD; No carotid bruits LYMPHATICS: No lymphadenopathy CARDIAC: RRR, no murmurs, rubs, gallops RESPIRATORY:  Clear to auscultation without rales, wheezing or rhonchi  ABDOMEN: Soft, non-tender, non-distended MUSCULOSKELETAL: Trace left lower extremity edema; No deformity  SKIN: Warm and dry NEUROLOGIC:  Alert and oriented x 3 PSYCHIATRIC:  Normal affect   Assessment & Plan Medication management Recommend decreasing amiodarone to 100 mg daily to minimize drug toxicity. Encounter for monitoring amiodarone therapy Check TSH and LFTs today Coronary artery disease involving native coronary artery of native heart without angina pectoris Continue current management with amlodipine for antianginal therapy, atorvastatin, and low-dose aspirin Mixed hyperlipidemia Treated with  atorvastatin.  Last lipids with an LDL of 52. Stage 3 chronic kidney disease, unspecified whether stage 3a or 3b CKD  (HCC) Last creatinine was 2.26.  Will update labs.  Continue as needed furosemide for edema. Essential hypertension Treated with amlodipine.  Home blood pressures have been in acceptable range. Persistent atrial fibrillation (HCC) Rhythm control with amiodarone.  Reduce dose to 100 mg daily to limit toxicity.  Patient not anticoagulated with dementia and frequent falls.            Medication Adjustments/Labs and Tests Ordered: Current medicines are reviewed at length with the patient today.  Concerns regarding medicines are outlined above.  Orders Placed This Encounter  Procedures   Comprehensive metabolic panel   TSH   Meds ordered this encounter  Medications   amiodarone (PACERONE) 200 MG tablet    Sig: Take 0.5 tablets (100 mg total) by mouth daily.    Dispense:  45 tablet    Refill:  3    Dose DECREASE    Patient Instructions  Medication Instructions:  DECREASE Amiodarone to 100mg  daily *If you need a refill on your cardiac medications before your next appointment, please call your pharmacy*   Lab Work: CMET, TSH today If you have labs (blood work) drawn today and your tests are completely normal, you will receive your results only by: MyChart Message (if you have MyChart) OR A paper copy in the mail If you have any lab test that is abnormal or we need to change your treatment, we will call you to review the results.  Follow-Up: At [facility], you and your health needs are our priority.  As part of our continuing mission to provide you with exceptional heart care, we have created designated Provider Care Teams.  These Care Teams include your primary Cardiologist (physician) and Advanced Practice Providers (APPs -  Physician Assistants and Nurse Practitioners) who all work together to provide you with the care you need, when you need it.  Your next appointment:   6 month(s)  Provider:   Tonny Bollman, MD        Signed, Tonny Bollman, MD  01/07/2023  11:05 AM    [facility]

## 2023-01-07 NOTE — Assessment & Plan Note (Signed)
Continue current management with amlodipine for antianginal therapy, atorvastatin, and low-dose aspirin

## 2023-01-07 NOTE — Assessment & Plan Note (Signed)
Treated with atorvastatin.  Last lipids with an LDL of 52.

## 2023-01-07 NOTE — ED Triage Notes (Signed)
Patient presents to ED reporting fall on left side tonight at 2000. Patient reports frequent falls, one being last week, hitting right side. Patient's daughter reports hx of broken ribs. Patient does not take blood thinners, reports taking ASA. Skin tear noted to left arm. Patient did not hit head with fall today.

## 2023-01-07 NOTE — ED Provider Notes (Signed)
[place] EMERGENCY DEPARTMENT AT [facility] Provider Note   CSN: [id number] Arrival date & time: 01/07/23  2105     History  Chief Complaint  Patient presents with   [name] is a 89 y.o. male.  Patient fell tonight when he stood up to go to the bathroom.  He has history of frequent falls.  He had a fall a few weeks ago hit the right side of his ribs today when he fell he hit the left side of his ribs.  Does not take blood thinners.  Does not look like he hit his head.  He has a skin tear to his left arm.  Patient has no pain but does state that the right side of his ribs hurt last week and now the left side hurt a little bit more now.  But only when he moves.  Patient has a history of CAD, dementia.  Family denies any recent illness.  Does have a walker and wheelchair at home but he does not often use these in the house.  He denies any chest pain shortness of breath weakness numbness tingling or dizziness.  The history is provided by the patient and a caregiver.       Home Medications Prior to Admission medications   Medication Sig Start Date End Date Taking? Authorizing Provider  albuterol (PROAIR HFA) 108 (90 Base) MCG/ACT inhaler INHALE 2 PUFFS INTO THE LUNGS EVERY 6 HOURS AS NEEDED FOR WHEEZING OR SHORTNESS OF BREATH Patient taking differently: Inhale 2 puffs into the lungs every 6 (six) hours as needed for wheezing or shortness of breath. INHALE 2 PUFFS INTO THE LUNGS EVERY 6 HOURS AS NEEDED FOR WHEEZING OR SHORTNESS OF BREATH 08/06/19   Angiulli, Mcarthur Rossetti, PA-C  amiodarone (PACERONE) 200 MG tablet Take 0.5 tablets (100 mg total) by mouth daily. 01/07/23   Tonny Bollman, MD  amLODipine (NORVASC) 5 MG tablet Take 1 tablet (5 mg total) by mouth daily. 11/01/22   Donita Brooks, MD  aspirin EC 81 MG tablet Take 1 tablet (81 mg total) by mouth daily. Swallow whole. 05/03/22   Almon Hercules, MD  atorvastatin (LIPITOR) 20 MG tablet TAKE 1 TABLET BY MOUTH  EVERY DAY 11/02/22   Donita Brooks, MD  empagliflozin (JARDIANCE) 10 MG TABS tablet Take 1 tablet (10 mg total) by mouth daily before breakfast. 12/31/22   Donita Brooks, MD  escitalopram (LEXAPRO) 10 MG tablet TAKE 1 TABLET BY MOUTH EVERY DAY 10/18/22   Donita Brooks, MD  ezetimibe (ZETIA) 10 MG tablet TAKE 1 TABLET BY MOUTH EVERY DAY 05/23/22   Tonny Bollman, MD  fluticasone furoate-vilanterol (BREO ELLIPTA) 200-25 MCG/ACT AEPB INHALE 1 PUFF DAILY *NEED APPT* Patient taking differently: Inhale 1 puff into the lungs daily. INHALE 1 PUFF DAILY *NEED APPT* 01/16/22   Donita Brooks, MD  furosemide (LASIX) 40 MG tablet Take 1 tablet (40 mg total) by mouth every other day. Patient taking differently: Take 40 mg by mouth as needed for edema or fluid. 12/25/21   Tonny Bollman, MD  loratadine (CLARITIN) 10 MG tablet Take 10 mg by mouth daily as needed for allergies or rhinitis.     [provider]  memantine (NAMENDA) 5 MG tablet TAKE 1 TABLET BY MOUTH EVERYDAY AT BEDTIME 11/20/22   Marcos Eke, PA-C  Multiple Vitamin (MULTIVITAMIN WITH MINERALS) TABS tablet Take 1 tablet by mouth daily.    [provider]  Allergies    Gabapentin    Review of Systems   Review of Systems  Physical Exam Updated Vital Signs BP (!) 162/63   Pulse 66   Temp (!) 97 F (36.1 C) (Temporal)   Resp 20   Ht 5\' 6"  (1.676 m)   Wt 79.4 kg   SpO2 99%   BMI 28.25 kg/m  Physical Exam Vitals and nursing note reviewed.  Constitutional:      General: He is not in acute distress.    Appearance: He is well-developed. He is not ill-appearing.  HENT:     Head: Normocephalic and atraumatic.     Nose: Nose normal.     Mouth/Throat:     Mouth: Mucous membranes are moist.  Eyes:     Extraocular Movements: Extraocular movements intact.     Conjunctiva/sclera: Conjunctivae normal.     Pupils: Pupils are equal, round, and reactive to light.  Cardiovascular:     Rate and Rhythm:  Normal rate and regular rhythm.     Pulses: Normal pulses.     Heart sounds: Normal heart sounds. No murmur heard. Pulmonary:     Effort: Pulmonary effort is normal. No respiratory distress.     Breath sounds: Normal breath sounds.  Abdominal:     Palpations: Abdomen is soft.     Tenderness: There is no abdominal tenderness.  Musculoskeletal:        General: Tenderness present. No swelling.     Cervical back: Normal range of motion and neck supple.     Comments: Tenderness over the right side and left side of his ribs, no midline spinal tenderness,  Skin:    General: Skin is warm and dry.     Capillary Refill: Capillary refill takes less than 2 seconds.     Comments: Skin tear to left forearm  Neurological:     General: No focal deficit present.     Mental Status: He is alert.     Cranial Nerves: No cranial nerve deficit.     Sensory: No sensory deficit.     Motor: No weakness.  Psychiatric:        Mood and Affect: Mood normal.     ED Results / Procedures / Treatments   Labs (all labs ordered are listed, but only abnormal results are displayed) Labs Reviewed  CBC WITH DIFFERENTIAL/PLATELET - Abnormal; Notable for the following components:      Result Value   RBC 3.30 (*)    Hemoglobin 9.9 (*)    HCT 30.0 (*)    Lymphs Abs 0.6 (*)    All other components within normal limits  BASIC METABOLIC PANEL - Abnormal; Notable for the following components:   Glucose, Bld 142 (*)    BUN 46 (*)    Creatinine, Ser 2.51 (*)    Calcium 8.7 (*)    GFR, Estimated 24 (*)    All other components within normal limits    EKG None  Radiology No results found.  Procedures Procedures    Medications Ordered in ED Medications  lidocaine (LIDODERM) 5 % 1 patch (1 patch Transdermal Patch Applied 01/07/23 2137)  oxyCODONE-acetaminophen (PERCOCET/ROXICET) 5-325 MG per tablet 1 tablet (1 tablet Oral Given 01/07/23 2137)    ED Course/ Medical Decision Making/ A&P                                  Medical Decision Making Amount and/or  Complexity of Data Reviewed Labs: ordered. Radiology: ordered.  Risk Prescription drug management.   [name] is here after a fall.  Unremarkable vitals.  Clear breath sounds.  No tenderness to his lower extremities or pelvis.  Pain mostly to the left side of his ribs.  He has prior pain to his right side of her ribs from previous fall.  No signs of head trauma.  No midline spinal pain.  He has a history of dementia, chronic kidney disease, CAD.  Will check basic labs and get a CT scan of his head neck and chest.  Differential diagnosis likely rib fracture versus contusion.  Clinically I have low suspicion for pneumothorax given clear breath sounds and unremarkable vitals.  Will check basic labs to make sure he is not anemic or having in any any electrolyte abnormality.  EKG shows sinus rhythm.  No ischemic changes.  Lab work per my review and interpretation is unremarkable.  Creatinine at baseline.  No significant leukocytosis or electrolyte abnormality.  Patient handed off to oncoming ED staff with patient pending CT scans.  He was given Percocet and lidocaine patch.  This chart was dictated using voice recognition software.  Despite best efforts to proofread,  errors can occur which can change the documentation meaning.         Final Clinical Impression(s) / ED Diagnoses Final diagnoses:  Fall, initial encounter  Contusion of rib, unspecified laterality, initial encounter    Rx / DC Orders ED Discharge Orders     None         Virgina Norfolk, DO 01/07/23 2326

## 2023-01-07 NOTE — Assessment & Plan Note (Signed)
Last creatinine was 2.26.  Will update labs.  Continue as needed furosemide for edema.

## 2023-01-07 NOTE — Assessment & Plan Note (Signed)
Treated with amlodipine.  Home blood pressures have been in acceptable range.

## 2023-01-07 NOTE — ED Notes (Signed)
Patient transported to CT 

## 2023-01-07 NOTE — Patient Instructions (Signed)
Medication Instructions:  DECREASE Amiodarone to 100mg  daily *If you need a refill on your cardiac medications before your next appointment, please call your pharmacy*   Lab Work: CMET, TSH today If you have labs (blood work) drawn today and your tests are completely normal, you will receive your results only by: MyChart Message (if you have MyChart) OR A paper copy in the mail If you have any lab test that is abnormal or we need to change your treatment, we will call you to review the results.  Follow-Up: At [facility], you and your health needs are our priority.  As part of our continuing mission to provide you with exceptional heart care, we have created designated Provider Care Teams.  These Care Teams include your primary Cardiologist (physician) and Advanced Practice Providers (APPs -  Physician Assistants and Nurse Practitioners) who all work together to provide you with the care you need, when you need it.  Your next appointment:   6 month(s)  Provider:   Tonny Bollman, MD

## 2023-01-08 LAB — COMPREHENSIVE METABOLIC PANEL
ALT: 44 [IU]/L (ref 0–44)
AST: 53 [IU]/L — ABNORMAL HIGH (ref 0–40)
Albumin: 3.9 g/dL (ref 3.7–4.7)
Alkaline Phosphatase: 134 [IU]/L — ABNORMAL HIGH (ref 44–121)
BUN/Creatinine Ratio: 14 (ref 10–24)
BUN: 36 mg/dL — ABNORMAL HIGH (ref 8–27)
Bilirubin Total: 0.5 mg/dL (ref 0.0–1.2)
CO2: 26 mmol/L (ref 20–29)
Calcium: 8.8 mg/dL (ref 8.6–10.2)
Chloride: 101 mmol/L (ref 96–106)
Creatinine, Ser: 2.56 mg/dL — ABNORMAL HIGH (ref 0.76–1.27)
Globulin, Total: 2.6 g/dL (ref 1.5–4.5)
Glucose: 128 mg/dL — ABNORMAL HIGH (ref 70–99)
Potassium: 4.5 mmol/L (ref 3.5–5.2)
Sodium: 140 mmol/L (ref 134–144)
Total Protein: 6.5 g/dL (ref 6.0–8.5)
eGFR: 24 mL/min/{1.73_m2} — ABNORMAL LOW (ref >59)

## 2023-01-08 LAB — TSH: TSH: 2.25 u[IU]/mL (ref 0.450–4.500)

## 2023-01-08 MED ORDER — OXYCODONE-ACETAMINOPHEN 5-325 MG PO TABS: 1.0000 | ORAL_TABLET | Freq: Four times a day (QID) | ORAL | 0 refills | Status: DC | PRN

## 2023-01-08 MED ORDER — LIDOCAINE 5 % EX PTCH: 1.0000 | MEDICATED_PATCH | CUTANEOUS | 0 refills | Status: DC

## 2023-01-08 NOTE — ED Provider Notes (Signed)
Patient signed out pending imaging.  In brief patient presents with recurrent falls.  CT head, neck, and chest ordered.  These were reviewed.  No evidence of intracranial trauma or rib fracture.  Patient does have evidence of old rib fractures.  He and his family were updated at the bedside.  Will discharge with Lidoderm patches.  I have sent a short course of oxycodone as well but have encouraged him to use nonnarcotic pain meds primarily.  Patient and family are agreeable plan.  Physical Exam  BP (!) 154/60   Pulse (!) 57   Temp (!) 97 F (36.1 C) (Temporal)   Resp 12   Ht 1.676 m (5\' 6" )   Wt 79.4 kg   SpO2 96%   BMI 28.25 kg/m   Physical Exam Awake, alert, no acute distress Procedures  Procedures  ED Course / MDM    Medical Decision Making Amount and/or Complexity of Data Reviewed Labs: ordered. Radiology: ordered.  Risk Prescription drug management.   Problem List Items Addressed This Visit   None Visit Diagnoses     Fall, initial encounter    -  Primary   Contusion of rib, unspecified laterality, initial encounter                 Shon Baton, MD 01/08/23 0140

## 2023-01-08 NOTE — Discharge Instructions (Signed)
You were seen today for recurrent falls.  There is no evidence of new rib fractures or other injury.  You likely have a contusion.  Use Lidoderm patch.  You may use oxycodone if pain is not improved with Tylenol or Lidoderm.

## 2023-01-09 ENCOUNTER — Telehealth: Payer: Self-pay

## 2023-01-09 NOTE — Transitions of Care (Post Inpatient/ED Visit) (Unsigned)
   01/09/2023  Name: [name] MRN: [id number] DOB: 12/02/1933  Today's TOC FU Call Status: Today's TOC FU Call Status:: Unsuccessful Call (1st Attempt) Unsuccessful Call (1st Attempt) Date: 01/09/23  Attempted to reach the patient regarding the most recent Inpatient/ED visit.  Follow Up Plan: Additional outreach attempts will be made to reach the patient to complete the Transitions of Care (Post Inpatient/ED visit) call.   Signature Karena Addison, LPN [facility] Nurse Health Advisor Direct Dial [phone]

## 2023-01-10 NOTE — Telephone Encounter (Signed)
Transition Care Management Follow-up Telephone Call Date of discharge and from where: 01-09-2023 How have you been since you were released from the hospital? [facility] Any questions or concerns? No  Spoke with patients wife  Items Reviewed: Did the pt receive and understand the discharge instructions provided? Yes  Medications obtained and verified?  Did not get Oxycodone, is using lidocaine patch.  Had to remove 01-09-2023 over night did not tolerate it.  Tylenol seems to be helping .   Other? No  Any new allergies since your discharge? No  Dietary orders reviewed? No Do you have support at home? Yes   Home Care and Equipment/Supplies: Were home health services ordered? no If so, what is the name of the agency?   Has the agency set up a time to come to the patient's home? no Were any new equipment or medical supplies ordered?  No What is the name of the medical supply agency?  Were you able to get the supplies/equipment? no Do you have any questions related to the use of the equipment or supplies? No  Functional Questionnaire: (I = Independent and D = Dependent) ADLs: D  Bathing/Dressing- D  Meal Prep- D  Eating- I  Maintaining continence- I  Transferring/Ambulation- I/WITH HELP  Managing Meds- i  Follow up appointments reviewed:  PCP Hospital f/u appt confirmed? No  Scheduled Spoke with patient wife will call to schedule appointment with [facility] if feels things are not progressing.[facility] f/u appt confirmed? No . Are transportation arrangements needed? No  If their condition worsens, is the pt aware to call PCP or go to the Emergency Dept.? Yes Was the patient provided with contact information for the PCP's office or ED? Yes Was to pt encouraged to call back with questions or concerns? Yes

## 2023-01-13 ENCOUNTER — Encounter (HOSPITAL_BASED_OUTPATIENT_CLINIC_OR_DEPARTMENT_OTHER): Payer: Self-pay | Admitting: Emergency Medicine

## 2023-01-13 ENCOUNTER — Emergency Department (HOSPITAL_BASED_OUTPATIENT_CLINIC_OR_DEPARTMENT_OTHER): Payer: PPO

## 2023-01-13 ENCOUNTER — Emergency Department (HOSPITAL_BASED_OUTPATIENT_CLINIC_OR_DEPARTMENT_OTHER)
Admission: EM | Admit: 2023-01-13 | Discharge: 2023-01-13 | Disposition: A | Discharge status: Home / Self Care | Payer: PPO | Attending: Emergency Medicine | Admitting: Emergency Medicine

## 2023-01-13 ENCOUNTER — Other Ambulatory Visit: Payer: Self-pay

## 2023-01-13 DIAGNOSIS — Z7982 Long term (current) use of aspirin: Secondary | ICD-10-CM | POA: Diagnosis not present

## 2023-01-13 DIAGNOSIS — Z7951 Long term (current) use of inhaled steroids: Secondary | ICD-10-CM | POA: Diagnosis not present

## 2023-01-13 DIAGNOSIS — Z79899 Other long term (current) drug therapy: Secondary | ICD-10-CM | POA: Diagnosis not present

## 2023-01-13 DIAGNOSIS — Z951 Presence of aortocoronary bypass graft: Secondary | ICD-10-CM | POA: Diagnosis not present

## 2023-01-13 DIAGNOSIS — E1122 Type 2 diabetes mellitus with diabetic chronic kidney disease: Secondary | ICD-10-CM | POA: Diagnosis not present

## 2023-01-13 DIAGNOSIS — J189 Pneumonia, unspecified organism: Secondary | ICD-10-CM | POA: Diagnosis not present

## 2023-01-13 DIAGNOSIS — N1832 Chronic kidney disease, stage 3b: Secondary | ICD-10-CM | POA: Diagnosis not present

## 2023-01-13 DIAGNOSIS — R109 Unspecified abdominal pain: Secondary | ICD-10-CM

## 2023-01-13 DIAGNOSIS — J4540 Moderate persistent asthma, uncomplicated: Secondary | ICD-10-CM | POA: Insufficient documentation

## 2023-01-13 DIAGNOSIS — F0390 Unspecified dementia without behavioral disturbance: Secondary | ICD-10-CM | POA: Insufficient documentation

## 2023-01-13 DIAGNOSIS — R1012 Left upper quadrant pain: Secondary | ICD-10-CM | POA: Diagnosis not present

## 2023-01-13 DIAGNOSIS — I2510 Atherosclerotic heart disease of native coronary artery without angina pectoris: Secondary | ICD-10-CM | POA: Insufficient documentation

## 2023-01-13 DIAGNOSIS — I129 Hypertensive chronic kidney disease with stage 1 through stage 4 chronic kidney disease, or unspecified chronic kidney disease: Secondary | ICD-10-CM | POA: Insufficient documentation

## 2023-01-13 DIAGNOSIS — R0789 Other chest pain: Secondary | ICD-10-CM | POA: Diagnosis present

## 2023-01-13 LAB — URINALYSIS, ROUTINE W REFLEX MICROSCOPIC
Bacteria, UA: NONE SEEN
Bilirubin Urine: NEGATIVE
Glucose, UA: >1000 mg/dL — AB
Ketones, ur: NEGATIVE mg/dL
Leukocytes,Ua: NEGATIVE
Nitrite: NEGATIVE
Protein, ur: 100 mg/dL — AB
Specific Gravity, Urine: 1.017 (ref 1.005–1.030)
pH: 5.5 (ref 5.0–8.0)

## 2023-01-13 LAB — BASIC METABOLIC PANEL
Anion gap: 8 (ref 5–15)
BUN: 34 mg/dL — ABNORMAL HIGH (ref 8–23)
CO2: 25 mmol/L (ref 22–32)
Calcium: 8.7 mg/dL — ABNORMAL LOW (ref 8.9–10.3)
Chloride: 103 mmol/L (ref 98–111)
Creatinine, Ser: 2.28 mg/dL — ABNORMAL HIGH (ref 0.61–1.24)
GFR, Estimated: 27 mL/min — ABNORMAL LOW (ref >60)
Glucose, Bld: 113 mg/dL — ABNORMAL HIGH (ref 70–99)
Potassium: 3.9 mmol/L (ref 3.5–5.1)
Sodium: 136 mmol/L (ref 135–145)

## 2023-01-13 LAB — CBC
HCT: 32.8 % — ABNORMAL LOW (ref 39.0–52.0)
Hemoglobin: 10.7 g/dL — ABNORMAL LOW (ref 13.0–17.0)
MCH: 29.6 pg (ref 26.0–34.0)
MCHC: 32.6 g/dL (ref 30.0–36.0)
MCV: 90.9 fL (ref 80.0–100.0)
Platelets: 261 10*3/uL (ref 150–400)
RBC: 3.61 MIL/uL — ABNORMAL LOW (ref 4.22–5.81)
RDW: 14.7 % (ref 11.5–15.5)
WBC: 6.6 10*3/uL (ref 4.0–10.5)
nRBC: 0 % (ref 0.0–0.2)

## 2023-01-13 LAB — LIPASE, BLOOD: Lipase: 118 U/L — ABNORMAL HIGH (ref 11–51)

## 2023-01-13 LAB — HEPATIC FUNCTION PANEL
ALT: 52 U/L — ABNORMAL HIGH (ref 0–44)
AST: 57 U/L — ABNORMAL HIGH (ref 15–41)
Albumin: 3.6 g/dL (ref 3.5–5.0)
Alkaline Phosphatase: 130 U/L — ABNORMAL HIGH (ref 38–126)
Bilirubin, Direct: 0.1 mg/dL (ref 0.0–0.2)
Indirect Bilirubin: 0.5 mg/dL (ref 0.3–0.9)
Total Bilirubin: 0.6 mg/dL (ref <1.2)
Total Protein: 7.2 g/dL (ref 6.5–8.1)

## 2023-01-13 MED ORDER — OXYCODONE HCL 5 MG PO TABS
5.0000 mg | ORAL_TABLET | Freq: Once | ORAL | Status: AC
  Administered 2023-01-13: 5 mg via ORAL
  Filled 2023-01-13: qty 1

## 2023-01-13 MED ORDER — DOXYCYCLINE HYCLATE 100 MG PO CAPS: 100.0000 mg | ORAL_CAPSULE | Freq: Two times a day (BID) | ORAL | 0 refills | Status: DC

## 2023-01-13 NOTE — Discharge Instructions (Addendum)
It was a pleasure caring for you today in the emergency department.  Get plenty of rest over the next few days.  Take antibiotics as prescribed.  Follow-up with your primary care doctor next week for recheck  Please return to the emergency department for any worsening or worrisome symptoms.

## 2023-01-13 NOTE — ED Provider Notes (Signed)
[place] EMERGENCY DEPARTMENT AT [facility] Provider Note  CSN: [id number] Arrival date & time: 01/13/23 1431  Chief Complaint(s) Flank Pain  HPI [name] is a 89 y.o. male with past medical history as below, significant for Alzheimer's dementia, CAD status post DES to pLAD, SAH, HLD, asthma, CKD, type II DM who presents to the ED with complaint of left-sided pain  Patient was seen 12/2 following a fall.  Workup at that time was stable, CT imaging notable for unchanged pleural effusion, prior rib fracture.  Patient reports worsening pain since then, difficulty taking a deep breath, difficulty finding position of comfort.  Pain primarily to left lower chest wall and left upper quadrant.  No vomiting.  Occasional cough that is nonproductive.  No blood thinners.  Having some urgency of urination past few days, occasional hematuria.  No BRBPR or melena.  No change p.o. intake.  Hasbeen taking Tylenol at home, have not been taking narcotic because worried about falls/delirium  Here with spouse who is primary caretaker  Past Medical History Past Medical History:  Diagnosis Date   Acute blood loss anemia    Acute upper respiratory infections of unspecified site 11/04/2012   AKI (acute kidney injury) (HCC)    Alzheimer disease (HCC)    Benign prostatic hyperplasia    CAD (coronary artery disease)    s/p cypher DES to pLAD 6/08; normal LVF;  ETT-Myoview 2009: no ischemia    Clavicle fracture 08/10/2019   Closed displaced fracture of phalanx of left thumb, sequela 08/10/2019   Coronary atherosclerosis of native coronary artery 03/08/2008   Dizziness 03/08/2020   Eosinophilia    Essential hypertension    Fracture of multiple ribs with pain 08/10/2019   Hematoma 11/04/2012   History of multiple falls    History of SAH (subarachnoid hemorrhage) 07/14/2019   Hyperlipidemia type IIB / III 03/08/2008   Itching of ear 07/14/2021   Major neurocognitive disorder due to  possible Alzheimer's disease, without behavioral disturbance (HCC) 02/29/2020   MI (myocardial infarction) (HCC)    Moderate persistent asthma    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   Imaging revealed [facility]   Multiple trauma    Nocturnal leg cramps 11/23/2012   Orthostatic hypotension 03/08/2008   Otalgia of left ear 05/11/2021   Otorrhagia of left ear 03/30/2021   Pain in joint, shoulder region 11/23/2012   Stage 3b chronic kidney disease (HCC)    Thrombocytopenia (HCC)    Trigeminal neuralgia    Type 2 diabetes mellitus (HCC)    Patient Active Problem List   Diagnosis Date Noted   Hallux valgus, acquired 08/23/2022   Fever 05/09/2022   Chest pain 05/05/2022   CKD (chronic kidney disease) stage 3, GFR 30-59 ml/min (HCC) 05/05/2022   Atrial fibrillation with RVR (HCC) 04/27/2022   Itching of ear 07/14/2021   Otalgia of left ear 05/11/2021   Otorrhagia of left ear 03/30/2021   Dizziness 03/08/2020   Major neurocognitive disorder due to possible Alzheimer's disease, without behavioral disturbance 02/29/2020   CAD (coronary artery disease)    MI (myocardial infarction)    Trigeminal neuralgia    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 Authorizing Provider  doxycycline (VIBRAMYCIN) 100 MG capsule Take 1 capsule (100 mg total) by mouth 2 (two) times  daily. 01/13/23  Yes Tanda Rockers A, DO  albuterol (PROAIR HFA) 108 (90 Base) MCG/ACT inhaler INHALE 2 PUFFS INTO THE LUNGS EVERY 6 HOURS AS NEEDED FOR WHEEZING OR SHORTNESS OF BREATH Patient taking differently: Inhale 2 puffs into the lungs every 6 (six) hours as needed for wheezing or shortness of breath. INHALE 2 PUFFS INTO THE LUNGS EVERY 6 HOURS AS NEEDED FOR WHEEZING OR SHORTNESS OF BREATH 08/06/19   Angiulli, Mcarthur Rossetti, PA-C  amiodarone (PACERONE) 200 MG tablet Take 0.5 tablets (100 mg total) by mouth daily. 01/07/23   Tonny Bollman, MD  amLODipine (NORVASC) 5 MG tablet Take 1 tablet (5 mg total) by mouth daily. 11/01/22   Donita Brooks, MD  aspirin EC 81 MG tablet Take 1 tablet (81 mg total) by mouth daily. Swallow whole. 05/03/22   Almon Hercules, MD  atorvastatin (LIPITOR) 20 MG tablet TAKE 1 TABLET BY MOUTH EVERY DAY 11/02/22   Donita Brooks, MD  empagliflozin (JARDIANCE) 10 MG TABS tablet Take 1 tablet (10 mg total) by mouth daily before breakfast. 12/31/22   Donita Brooks, MD  escitalopram (LEXAPRO) 10 MG tablet TAKE 1 TABLET BY MOUTH EVERY DAY 10/18/22   Donita Brooks, MD  ezetimibe (ZETIA) 10 MG tablet TAKE 1 TABLET BY MOUTH EVERY DAY 05/23/22   Tonny Bollman, MD  fluticasone furoate-vilanterol (BREO ELLIPTA) 200-25 MCG/ACT AEPB INHALE 1 PUFF DAILY *NEED APPT* Patient taking differently: Inhale 1 puff into the lungs daily. INHALE 1 PUFF DAILY *NEED APPT* 01/16/22   Donita Brooks, MD  furosemide (LASIX) 40 MG tablet Take 1 tablet (40 mg total) by mouth every other day. Patient taking differently: Take 40 mg by mouth as needed for edema or fluid. 12/25/21   Tonny Bollman, MD  lidocaine (LIDODERM) 5 % Place 1 patch onto the skin daily. Remove & Discard patch within 12 hours or as directed by MD 01/08/23   Horton, Mayer Masker, MD  loratadine (CLARITIN) 10 MG tablet Take 10 mg by mouth daily as needed for allergies or rhinitis.     [provider]  memantine (NAMENDA)  5 MG tablet TAKE 1 TABLET BY MOUTH EVERYDAY AT BEDTIME 11/20/22   Marcos Eke, PA-C  Multiple Vitamin (MULTIVITAMIN WITH MINERALS) TABS tablet Take 1 tablet by mouth daily.    [provider]  oxyCODONE-acetaminophen (PERCOCET/ROXICET) 5-325 MG tablet Take 1 tablet by mouth every 6 (six) hours as needed for severe pain (pain score 7-10). 01/08/23   Horton, Mayer Masker, MD  Past Surgical History Past Surgical History:  Procedure Laterality Date   APPENDECTOMY     CARDIAC CATHETERIZATION  07/30/2006   CORONARY ANGIOPLASTY WITH STENT PLACEMENT   CARDIAC CATHETERIZATION     CATARACT EXTRACTION Right 03/2022   CATARACT EXTRACTION Left 04/2022   EXPLORATORY LAPAROTOMY     age 10   EXPLORATORY LAPAROTOMY     IR THORACENTESIS ASP PLEURAL SPACE W/IMG GUIDE  10/05/2019   Family History Family History  Problem Relation Age of Onset   Stomach cancer Mother    Memory loss Mother    Heart disease Father    Coronary artery disease Father 45   Heart attack Father    Heart disease Brother    Aortic aneurysm Brother    Colon cancer Brother    Aortic aneurysm Brother    Colon cancer Brother    Arthritis Daughter        rheumatoid   Coronary artery disease Brother    Esophageal cancer Neg Hx    Rectal cancer Neg Hx     Social History Social History   Tobacco Use   Smoking status: Never   Smokeless tobacco: Never  Vaping Use   Vaping status: Never Used  Substance Use Topics   Alcohol use: Not Currently   Drug use: Never   Allergies Gabapentin  Review of Systems Review of Systems  Constitutional:  Negative for chills and fever.  Eyes:  Negative for pain and visual disturbance.  Respiratory:  Positive for cough. Negative for shortness of breath.   Cardiovascular:  Positive for chest pain. Negative for palpitations.  Gastrointestinal:   Positive for abdominal pain. Negative for nausea and vomiting.  Genitourinary:  Positive for hematuria and urgency.  Musculoskeletal:  Positive for arthralgias.  Neurological:  Negative for syncope.  All other systems reviewed and are negative.   Physical Exam Vital Signs  I have reviewed the triage vital signs BP (!) 158/62 (BP Location: Right Arm)   Pulse 66   Temp 98.7 F (37.1 C) (Oral)   Resp 17   SpO2 93%  Physical Exam Vitals and nursing note reviewed.  Constitutional:      General: He is not in acute distress.    Appearance: He is well-developed.  HENT:     Head: Normocephalic and atraumatic.     Right Ear: External ear normal.     Left Ear: External ear normal.     Mouth/Throat:     Mouth: Mucous membranes are moist.  Eyes:     General: No scleral icterus. Cardiovascular:     Rate and Rhythm: Normal rate and regular rhythm.     Pulses: Normal pulses.     Heart sounds: Normal heart sounds.  Pulmonary:     Effort: Pulmonary effort is normal. No respiratory distress.     Breath sounds: Normal breath sounds.  Chest:       Comments: No chest wall crepitance No bruising to area of discomfort on left side of thorax Abdominal:     General: Abdomen is flat.     Palpations: Abdomen is soft.     Tenderness: There is abdominal tenderness.    Musculoskeletal:     Cervical back: No rigidity.     Right lower leg: No edema.     Left lower leg: No edema.  Skin:    General: Skin is warm and dry.     Capillary Refill: Capillary refill takes less than 2 seconds.  Neurological:     Mental  Status: He is alert.  Psychiatric:        Mood and Affect: Mood normal.        Behavior: Behavior normal.     ED Results and Treatments Labs (all labs ordered are listed, but only abnormal results are displayed) Labs Reviewed  URINALYSIS, ROUTINE W REFLEX MICROSCOPIC - Abnormal; Notable for the following components:      Result Value   Glucose, UA >1,000 (*)    Hgb urine  dipstick SMALL (*)    Protein, ur 100 (*)    All other components within normal limits  BASIC METABOLIC PANEL - Abnormal; Notable for the following components:   Glucose, Bld 113 (*)    BUN 34 (*)    Creatinine, Ser 2.28 (*)    Calcium 8.7 (*)    GFR, Estimated 27 (*)    All other components within normal limits  CBC - Abnormal; Notable for the following components:   RBC 3.61 (*)    Hemoglobin 10.7 (*)    HCT 32.8 (*)    All other components within normal limits  HEPATIC FUNCTION PANEL - Abnormal; Notable for the following components:   AST 57 (*)    ALT 52 (*)    Alkaline Phosphatase 130 (*)    All other components within normal limits  LIPASE, BLOOD - Abnormal; Notable for the following components:   Lipase 118 (*)    All other components within normal limits                                                                                                                          Radiology CT CHEST ABDOMEN PELVIS WO CONTRAST  Result Date: 01/13/2023 CLINICAL DATA:  Larey Seat 7 days ago. Hit right-side on a table. Today with the on set of left abdominal pain. Pain with urination. EXAM: CT CHEST, ABDOMEN AND PELVIS WITHOUT CONTRAST TECHNIQUE: Multidetector CT imaging of the chest, abdomen and pelvis was performed following the standard protocol without IV contrast. RADIATION DOSE REDUCTION: This exam was performed according to the departmental dose-optimization program which includes automated exposure control, adjustment of the mA and/or kV according to patient size and/or use of iterative reconstruction technique. COMPARISON:  Chest CT, 01/07/2023. Chest, abdomen and pelvis CT, 10/11/2022. Abdomen and pelvis CT, 08/30/2021. FINDINGS: CT CHEST FINDINGS Cardiovascular: Heart is mildly enlarged. Three-vessel coronary artery calcifications. No pericardial effusion. Great vessels are normal in caliber. Aortic atherosclerotic calcifications. Mediastinum/Nodes: No neck base, mediastinal or hilar  masses. No enlarged lymph nodes. Trachea and esophagus are unremarkable. Lungs/Pleura: Small loculated left pleural effusion at the posterior lung base. No right pleural effusion. Rounded opacity in the left upper lobe lingula and at the posterior base of the left lower lobe, suspect to be atelectasis. There is adjacent ground-glass opacity in fine nodularity, which has increased when compared to the prior CT. Bilateral interstitial thickening. Lung findings are accentuated by respiratory motion. No pneumothorax. Musculoskeletal: Old rib fractures. No convincing acute fracture.  No bone lesion. No chest wall mass. CT ABDOMEN PELVIS FINDINGS Hepatobiliary: No focal liver abnormality is seen. No gallstones, gallbladder wall thickening, or biliary dilatation. Pancreas: 1.9 cm cystic appearing mass, pancreatic tail. This measured 2.6 cm on the exam from 10/11/2022. No other pancreatic abnormality. No inflammation. Spleen: Normal in size without focal abnormality. Adrenals/Urinary Tract: No adrenal masses. Significant bilateral renal cortical thinning. Nonobstructing stone in midpole the right kidney. Small nonobstructing stone in the lower pole the left kidney. No renal masses. No hydronephrosis. Normal ureters. Bladder unremarkable. Stomach/Bowel: Stomach is unremarkable. Small bowel and colon are normal in caliber. No wall thickening. No inflammation. Mild generalized increase in the colonic stool burden. Multiple sigmoid diverticula without diverticulitis. Stable coarse right lower quadrant mesenteric calcifications. Vascular/Lymphatic: 3 cm infrarenal abdominal aortic aneurysm. Aortic atherosclerosis. No enlarged lymph nodes. Reproductive: Stable prostate enlargement. Other: No abdominal wall hernia.  No ascites. Musculoskeletal: Chronic mild compression fracture of L3. No acute fracture. No bone lesion. IMPRESSION: 1. No evidence of acute injury within the chest, abdomen or pelvis. 2. Small loculated left pleural  effusion. Adjacent rounded opacity in the left upper lobe lingula and at the posterior base of the left lower lobe, suspect to be rounded atelectasis. There is adjacent ground-glass opacity and fine nodularity, which has increased when compared to the prior CT. This is nonspecific, but is suspicious for atypical infectious etiology. 3. Cardiomegaly, coronary artery disease, aortic atherosclerosis. 4. 1.9 cm cystic appearing mass in the pancreatic tail. This measured 2.6 cm on the exam from 10/11/2022 and has been stable dating back to a PET-CT from 01/13/2021. 5. Bilateral nonobstructing renal stones. 6. Mild increase in the colonic stool burden. 7. 3 cm abdominal aortic aneurysm Recommend follow-up ultrasound every 3 years. (Ref.: J Vasc Surg. 2018; 67:2-77 and J Am Coll Radiol 2013;10(10):789-794.) Aortic Atherosclerosis (ICD10-I70.0). Electronically Signed   By: Amie Portland M.D.   On: 01/13/2023 17:46    Pertinent labs & imaging results that were available during my care of the patient were reviewed by me and considered in my medical decision making (see MDM for details).  Medications Ordered in ED Medications  oxyCODONE (Oxy IR/ROXICODONE) immediate release tablet 5 mg (5 mg Oral Given 01/13/23 1655)                                                                                                                                     Procedures Procedures  (including critical care time)  Medical Decision Making / ED Course    Medical Decision Making:    TAJA GABEL is a 89 y.o. male with past medical history as below, significant for Alzheimer's dementia, CAD status post DES to pLAD, SAH, HLD, asthma, CKD, type II DM who presents to the ED with complaint of left-sided pain. The complaint involves an extensive differential diagnosis and also carries with it a high risk of complications and morbidity.  Serious etiology  was considered. Ddx includes but is not limited to: Contusion, fracture,  sprain, dislocation, pulmonary contusion, pneumothorax, intra-abdominal injury, intrathoracic injury, etc.  Complete initial physical exam performed, notably the patient was in no acute distress, no hypoxia.    Reviewed and confirmed nursing documentation for past medical history, family history, social history.  Vital signs reviewed.    Patient here with worsening pain following a fall about a week ago.  He was seen in the ED at that time discharged stable condition.  Will check labs, repeat imaging, give analgesics.  Clinical Course as of 01/13/23 2034  Wynelle Link Jan 13, 2023  1741 AST(!): 57 [SG]  1741 ALT(!): 52 [SG]  1741 Alkaline Phosphatase(!): 130 Similar to prev, mild elev [SG]  1914 CT w/ possible atypical infection [SG]  2001 Creatinine(!): 2.28 Similar to prior  [SG]  2014 Feeling better [SG]    Clinical Course User Index [SG] Sloan Leiter, DO    Brief summary: 89 year old male history as above here with ongoing pain from recent fall.  No repeat injuries.  Pain to left lower chest wall and left flank, left upper quadrant.  Labs reviewed and are stable.  Repeat CT imaging concern possible atypical infection.  He is feeling much better after analgesics.  No hypoxia.  Will go ahead and treat for possible atypical pneumonia given recent fall and rib injury.  He is high risk for decompensation.  Encourage multimodal pain control at home.  Incentive spirometer use.  Follow-up PCP next week.  Family concerned about sedation from narcotic use and have not been taking the narcotic.,  Worried about falls.  Advised to take medication at nighttime prior to bed time, can start with 1/2 tablet and take the second half if symptoms do not improve.   The patient improved significantly and was discharged in stable condition. Detailed discussions were had with the patient regarding current findings, and need for close f/u with PCP or on call doctor. The patient has been instructed to return  immediately if the symptoms worsen in any way for re-evaluation. Patient verbalized understanding and is in agreement with current care plan. All questions answered prior to discharge.                  Additional history obtained: -Additional history obtained from family -External records from outside source obtained and reviewed including: Chart review including previous notes, labs, imaging, consultation notes including  Recent ed visit Prior labs/imaging    Lab Tests: -I ordered, reviewed, and interpreted labs.   The pertinent results include:   Labs Reviewed  URINALYSIS, ROUTINE W REFLEX MICROSCOPIC - Abnormal; Notable for the following components:      Result Value   Glucose, UA >1,000 (*)    Hgb urine dipstick SMALL (*)    Protein, ur 100 (*)    All other components within normal limits  BASIC METABOLIC PANEL - Abnormal; Notable for the following components:   Glucose, Bld 113 (*)    BUN 34 (*)    Creatinine, Ser 2.28 (*)    Calcium 8.7 (*)    GFR, Estimated 27 (*)    All other components within normal limits  CBC - Abnormal; Notable for the following components:   RBC 3.61 (*)    Hemoglobin 10.7 (*)    HCT 32.8 (*)    All other components within normal limits  HEPATIC FUNCTION PANEL - Abnormal; Notable for the following components:   AST 57 (*)    ALT  52 (*)    Alkaline Phosphatase 130 (*)    All other components within normal limits  LIPASE, BLOOD - Abnormal; Notable for the following components:   Lipase 118 (*)    All other components within normal limits    Notable for labs stable  EKG   EKG Interpretation Date/Time:    Ventricular Rate:    PR Interval:    QRS Duration:    QT Interval:    QTC Calculation:   R Axis:      Text Interpretation:           Imaging Studies ordered: I ordered imaging studies including CT CAP I independently visualized the following imaging with scope of interpretation limited to determining acute life  threatening conditions related to emergency care; findings noted above I independently visualized and interpreted imaging. I agree with the radiologist interpretation   Medicines ordered and prescription drug management: Meds ordered this encounter  Medications   oxyCODONE (Oxy IR/ROXICODONE) immediate release tablet 5 mg   doxycycline (VIBRAMYCIN) 100 MG capsule    Sig: Take 1 capsule (100 mg total) by mouth 2 (two) times daily.    Dispense:  20 capsule    Refill:  0    -I have reviewed the patients home medicines and have made adjustments as needed   Consultations Obtained: na   Cardiac Monitoring: The patient was maintained on a cardiac monitor.  I personally viewed and interpreted the cardiac monitored which showed an underlying rhythm of: NSR Continuous pulse oximetry interpreted by myself, 97% on RA.    Social Determinants of Health:  Diagnosis or treatment significantly limited by social determinants of health: live at home w/ family   Reevaluation: After the interventions noted above, I reevaluated the patient and found that they have improved  Co morbidities that complicate the patient evaluation  Past Medical History:  Diagnosis Date   Acute blood loss anemia    Acute upper respiratory infections of unspecified site 11/04/2012   AKI (acute kidney injury) (HCC)    Alzheimer disease (HCC)    Benign prostatic hyperplasia    CAD (coronary artery disease)    s/p cypher DES to pLAD 6/08; normal LVF;  ETT-Myoview 2009: no ischemia    Clavicle fracture 08/10/2019   Closed displaced fracture of phalanx of left thumb, sequela 08/10/2019   Coronary atherosclerosis of native coronary artery 03/08/2008   Dizziness 03/08/2020   Eosinophilia    Essential hypertension    Fracture of multiple ribs with pain 08/10/2019   Hematoma 11/04/2012   History of multiple falls    History of SAH (subarachnoid hemorrhage) 07/14/2019   Hyperlipidemia type IIB / III 03/08/2008    Itching of ear 07/14/2021   Major neurocognitive disorder due to possible Alzheimer's disease, without behavioral disturbance (HCC) 02/29/2020   MI (myocardial infarction) (HCC)    Moderate persistent asthma    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   Imaging revealed [facility]   Multiple trauma    Nocturnal leg cramps 11/23/2012   Orthostatic hypotension 03/08/2008   Otalgia of left ear 05/11/2021   Otorrhagia of left ear 03/30/2021   Pain in joint, shoulder region 11/23/2012   Stage 3b chronic kidney disease (HCC)    Thrombocytopenia (HCC)    Trigeminal neuralgia    Type 2 diabetes mellitus (HCC)       Dispostion: Disposition decision including need for hospitalization was considered, and patient discharged from emergency department.    Final Clinical Impression(s) /  ED Diagnoses Final diagnoses:  Flank pain  Atypical pneumonia        Sloan Leiter, DO 01/13/23 2034

## 2023-01-13 NOTE — ED Triage Notes (Signed)
Pt fell Monday ,was dizzy when he stood up, right side hit cough, and end table. Pt was seen. But last night started having pain in his left abdomen and pain with urination

## 2023-01-13 NOTE — ED Triage Notes (Signed)
Pt  has alzheimer's, wife [name] at bedside.

## 2023-01-14 ENCOUNTER — Telehealth: Payer: Self-pay

## 2023-01-14 NOTE — Transitions of Care (Post Inpatient/ED Visit) (Signed)
01/14/2023  Name: [name] MRN: [id number] DOB: 10/17/1933  Today's TOC FU Call Status: Today's TOC FU Call Status:: Successful TOC FU Call Completed TOC FU Call Complete Date: 01/14/23 Patient's Name and Date of Birth confirmed.  Transition Care Management Follow-up Telephone Call Date of Discharge: 01/13/23 Discharge Facility: [facility] ([facility]) Type of Discharge: Emergency Department Reason for ED Visit: Other: (abd pain) How have you been since you were released from the hospital?: Better Any questions or concerns?: No  Items Reviewed: Did you receive and understand the discharge instructions provided?: Yes Medications obtained,verified, and reconciled?: Yes (Medications Reviewed) Any new allergies since your discharge?: No Dietary orders reviewed?: Yes Do you have support at home?: Yes People in Home: spouse  Medications Reviewed Today: Medications Reviewed Today     Reviewed by Karena Addison, LPN (Licensed Practical Nurse) on 01/14/23 at 1325  Med List Status: <None>   Medication Order Taking? Sig Documenting Provider Last Dose Status Informant  albuterol (PROAIR HFA) 108 (90 Base) MCG/ACT inhaler [id number] No INHALE 2 PUFFS INTO THE LUNGS EVERY 6 HOURS AS NEEDED FOR WHEEZING OR SHORTNESS OF BREATH  Patient taking differently: Inhale 2 puffs into the lungs every 6 (six) hours as needed for wheezing or shortness of breath. INHALE 2 PUFFS INTO THE LUNGS EVERY 6 HOURS AS NEEDED FOR WHEEZING OR SHORTNESS OF BREATH   Angiulli, Mcarthur Rossetti, PA-C Taking Active Multiple Informants  amiodarone (PACERONE) 200 MG tablet [id number]  Take 0.5 tablets (100 mg total) by mouth daily. Tonny Bollman, MD  Active   amLODipine (NORVASC) 5 MG tablet [id number] No Take 1 tablet (5 mg total) by mouth daily. Donita Brooks, MD Taking Active   aspirin EC 81 MG tablet [id number] No Take 1 tablet (81 mg total) by mouth daily. Swallow whole. Almon Hercules, MD Taking Active Multiple  Informants           Med Note (SATTERFIELD, Genoveva Ill   Sat May 05, 2022  7:11 PM)    atorvastatin (LIPITOR) 20 MG tablet [id number] No TAKE 1 TABLET BY MOUTH EVERY DAY Donita Brooks, MD Taking Active   doxycycline (VIBRAMYCIN) 100 MG capsule [id number]  Take 1 capsule (100 mg total) by mouth 2 (two) times daily. Sloan Leiter, DO  Active   empagliflozin (JARDIANCE) 10 MG TABS tablet [id number] No Take 1 tablet (10 mg total) by mouth daily before breakfast. Donita Brooks, MD Taking Active   escitalopram (LEXAPRO) 10 MG tablet [id number] No TAKE 1 TABLET BY MOUTH EVERY DAY Donita Brooks, MD Taking Active   ezetimibe (ZETIA) 10 MG tablet [id number] No TAKE 1 TABLET BY MOUTH EVERY DAY Tonny Bollman, MD Taking Active   fluticasone furoate-vilanterol (BREO ELLIPTA) 200-25 MCG/ACT AEPB [id number] No INHALE 1 PUFF DAILY *NEED APPT*  Patient taking differently: Inhale 1 puff into the lungs daily. INHALE 1 PUFF DAILY *NEED APPT*   Pickard, Priscille Heidelberg, MD Taking Active Multiple Informants  furosemide (LASIX) 40 MG tablet [id number] No Take 1 tablet (40 mg total) by mouth every other day.  Patient taking differently: Take 40 mg by mouth as needed for edema or fluid.   Tonny Bollman, MD Taking Active Multiple Informants           Med Note (SATTERFIELD, Genoveva Ill   Sat May 05, 2022  7:08 PM)    lidocaine (LIDODERM) 5 % [id number]  Place 1 patch onto the skin daily. Remove & Discard patch within 12 hours or as directed by  MD Shon Baton, MD  Active   loratadine (CLARITIN) 10 MG tablet [id number] No Take 10 mg by mouth daily as needed for allergies or rhinitis.  [provider] Taking Active Multiple Informants  memantine (NAMENDA) 5 MG tablet [id number] No TAKE 1 TABLET BY MOUTH EVERYDAY AT BEDTIME Marcos Eke, PA-C Taking Active   Multiple Vitamin (MULTIVITAMIN WITH MINERALS) TABS tablet [id number] No Take 1 tablet by mouth daily. [provider] Taking Active Multiple Informants   oxyCODONE-acetaminophen (PERCOCET/ROXICET) 5-325 MG tablet [id number]  Take 1 tablet by mouth every 6 (six) hours as needed for severe pain (pain score 7-10). Shon Baton, MD  Active             Home Care and Equipment/Supplies: Were Home Health Services Ordered?: NA Any new equipment or medical supplies ordered?: NA  Functional Questionnaire: Do you need assistance with bathing/showering or dressing?: Yes Do you need assistance with meal preparation?: Yes Do you need assistance with eating?: No Do you have difficulty maintaining continence: Yes Do you need assistance with getting out of bed/getting out of a chair/moving?: Yes Do you have difficulty managing or taking your medications?: Yes  Follow up appointments reviewed: PCP Follow-up appointment confirmed?: No (no avail appt, sent message to staff to schedule) MD Provider Line Number:[phone] Given: No Specialist Hospital Follow-up appointment confirmed?: NA Do you need transportation to your follow-up appointment?: No Do you understand care options if your condition(s) worsen?: Yes-patient verbalized understanding    SIGNATURE Karena Addison, LPN [facility] Nurse Health Advisor Direct Dial [phone]

## 2023-01-15 ENCOUNTER — Telehealth: Payer: Self-pay

## 2023-01-15 NOTE — Telephone Encounter (Signed)
Spoke with pt's wife, Clydie Braun, who states pt has a pressure sore in the middle of his upper buttocks, not open but is painful per Clydie Braun. Clydie Braun states she has been using A&D ointment but that is not helping with the pain in the area. Pt was seen in ER on 01/13/23 with c/o flank pain. Pt was given Oxycodone and is only taking 1/2 pill.  Clydie Braun is trying to keep patient off of his backside but is having difficulty getting him to stay on his side.  Clydie Braun asks if you can recommend anything else to help?

## 2023-01-16 ENCOUNTER — Encounter: Payer: Self-pay | Admitting: Family Medicine

## 2023-01-16 DIAGNOSIS — I4891 Unspecified atrial fibrillation: Secondary | ICD-10-CM | POA: Diagnosis not present

## 2023-01-16 DIAGNOSIS — F0150 Vascular dementia without behavioral disturbance: Secondary | ICD-10-CM | POA: Diagnosis not present

## 2023-01-16 DIAGNOSIS — L89300 Pressure ulcer of unspecified buttock, unstageable: Secondary | ICD-10-CM | POA: Diagnosis not present

## 2023-01-16 DIAGNOSIS — N183 Chronic kidney disease, stage 3 unspecified: Secondary | ICD-10-CM | POA: Diagnosis not present

## 2023-01-16 DIAGNOSIS — I509 Heart failure, unspecified: Secondary | ICD-10-CM | POA: Diagnosis not present

## 2023-01-16 DIAGNOSIS — J449 Chronic obstructive pulmonary disease, unspecified: Secondary | ICD-10-CM | POA: Diagnosis not present

## 2023-01-16 DIAGNOSIS — E1122 Type 2 diabetes mellitus with diabetic chronic kidney disease: Secondary | ICD-10-CM | POA: Diagnosis not present

## 2023-01-16 DIAGNOSIS — I110 Hypertensive heart disease with heart failure: Secondary | ICD-10-CM | POA: Diagnosis not present

## 2023-01-16 DIAGNOSIS — R300 Dysuria: Secondary | ICD-10-CM | POA: Diagnosis not present

## 2023-01-17 ENCOUNTER — Ambulatory Visit: Payer: Self-pay | Admitting: *Deleted

## 2023-01-17 DIAGNOSIS — L8990 Pressure ulcer of unspecified site, unspecified stage: Secondary | ICD-10-CM | POA: Insufficient documentation

## 2023-01-17 NOTE — Patient Outreach (Signed)
Care Coordination   Follow Up Visit Note   01/17/2023 Name: [name] MRN: [id number] DOB: [birth year]  [name] is a 89 y.o. year old male who sees Pickard, Priscille Heidelberg, MD for primary care. I spoke with  [name] by phone today.  What matters to the patients health and wellness today?  New pressure sores from sitting, falls, gel pad  Pressure sores- put on cream - gel pad needed [facility] does not have gel pads  [place] [facility] not operational/name change [employer] discount medical supply store has various gel pads at various costs but cost is out of pocket, not covered by insurance [facility] would provide a gel pad for wheelchair but patient did not obtain a wheelchair from [employer]. [facility] staff states out of pocket cost is generally $75 but suggests faxing an order/script requesting a gel pad for a wheelchair to them and they will see if [employer] (HTA) will offer assistance in the cost.  congestive Heart Failure (CHF)- managing okay  Just finished 3rd round with home health PT. Concerns discussed related to continuous need for therapy. wife aware of  the availability to restart home health after every 30 days   Productive cough -he can cough up sputum but swallows it before it is examined for any changes in color- prevents assessment of infection  Falls continue. This is the 4th-5th fall in the last 12 months. All others left torn skin but this recent one left rib contusion    Goals Addressed             This Visit's Progress    Manage home safety, memory concerns- care coordination services   Not on track    Decrease home falls- Larey Seat on 11/12/22 going to bed with wife behind him, fell backwards during communion on 12/16/22 & 12/17/22 when bending forward, 01/14/23 fall with rib contusion-  poor progression toward goal  Conclude exercises taught by home health PT (3rd time) - re order home health PT every 30 days as needed    Interventions  Today    Flowsheet Row Most Recent Value  Chronic Disease   Chronic disease during today's visit --  [new falls with rib contusion, bed sores]  General Interventions   General Interventions Discussed/Reviewed General Interventions Reviewed  Doctor Visits Discussed/Reviewed Doctor Visits Reviewed, PCP, Specialist  Durable Medical Equipment (DME) Other  [gel pad- [place] [facility]]  PCP/Specialist Visits Compliance with follow-up visit  Communication with PCP/Specialists  [left a message at [employer] medical/ gel pad requested a return call to RN CM or [name], wife. They called [name] & RN CM. RN CM asked pcp to fax gel pad for wheelchair order to [employer] also to see if HTA will assist with cost]  Exercise Interventions   Exercise Discussed/Reviewed Exercise Reviewed, Physical Activity, Assistive device use and maintanence  [updated [name] of outreach to [employer] supply, [employer] & pcp office r/t gel pad to prevent further bedsores. It is an out of pocket item if not needed for a purchased wheelchair. His wheelchair not received from [employer], Gel pad $75]  Physical Activity Discussed/Reviewed Physical Activity Reviewed  [[facility] suggested faxing them an order for gel pad needed for wheelchair so they can see if HTA will pay. The other options are to purchase out of pocket at $75 at [employer] or at [facility] discount supply store & choosing one depending on the costa offered.]  Education Interventions   Education Provided Provided Education  [  bedsores, falls, pain management]  Provided Verbal Education On Community Resources  Mental Health Interventions   Mental Health Discussed/Reviewed Mental Health Reviewed, Coping Strategies      Interventions Today    Flowsheet Row Most Recent Value  Chronic Disease   Chronic disease during today's visit --  [new falls with rib contusion, bed sores]  General Interventions   General Interventions Discussed/Reviewed General  Interventions Reviewed  Doctor Visits Discussed/Reviewed Doctor Visits Reviewed, PCP, Specialist  Durable Medical Equipment (DME) Other  [gel pad- [place] [facility]]  PCP/Specialist Visits Compliance with follow-up visit  Communication with PCP/Specialists  [left a message at [employer] medical/ gel pad requested a return call to RN CM or [name], wife. They called [name] & RN CM. RN CM asked pcp to fax gel pad for wheelchair order to [employer] also to see if HTA will assist with cost]  Exercise Interventions   Exercise Discussed/Reviewed Exercise Reviewed, Physical Activity, Assistive device use and maintanence  [updated [name] of outreach to [employer] supply, [employer] & pcp office r/t gel pad to prevent further bedsores. It is an out of pocket item if not needed for a purchased wheelchair. His wheelchair not received from [employer], Gel pad $75]  Physical Activity Discussed/Reviewed Physical Activity Reviewed  [[employer] suggested faxing them an order for gel pad needed for wheelchair so they can see if HTA will pay. The other options are to purchase out of pocket at $75 at [employer] or at [facility] discount supply store & choosing one depending on the costa offered.]  Education Interventions   Education Provided Provided Education  [bedsores, falls, pain management]  Provided Verbal Education On [employer]  Mental Health Interventions   Mental Health Discussed/Reviewed Mental Health Reviewed, Coping Strategies                SDOH assessments and interventions completed:  No     Care Coordination Interventions:  Yes, provided   Follow up plan: Follow up call scheduled for 02/21/23    Encounter Outcome:  Patient Visit Completed   Cala Bradford L. Noelle Penner, RN, BSN, [facility]  VBCI Care Management Coordinator  [phone]  Fax: [phone]

## 2023-01-17 NOTE — Patient Outreach (Addendum)
  Care Coordination   01/17/2023 Name: [name] MRN: [id number] DOB: Sep 24, 1933   Care Coordination Outreach Attempts:  An unsuccessful telephone outreach was attempted today to offer the patient information about available care coordination services.  Follow Up Plan:  Additional outreach attempts will be made to offer the patient care coordination information and services.   Encounter Outcome:  Patient Request to Call Back in the rest room   Care Coordination Interventions:  No, not indicated     Aseem Sessums L. Noelle Penner, RN, BSN, [facility]  VBCI Care Management Coordinator  [phone]  Fax: [phone]

## 2023-01-17 NOTE — Patient Instructions (Addendum)
Visit Information  Thank you for taking time to visit with me today. Please don't hesitate to contact me if I can be of assistance to you.   Following are the goals we discussed today:   Goals Addressed             This Visit's Progress    Manage home safety, memory concerns- care coordination services   Not on track    Decrease home falls- Larey Seat on 11/12/22 going to bed with wife behind him, fell backwards during communion on 12/16/22 & 12/17/22 when bending forward, 01/14/23 fall with rib contusion-  poor progression toward goal  Conclude exercises taught by home health PT (3rd time) - re order home health PT every 30 days as needed    Interventions Today    Flowsheet Row Most Recent Value  Chronic Disease   Chronic disease during today's visit --  [new falls with rib contusion, bed sores]  General Interventions   General Interventions Discussed/Reviewed General Interventions Reviewed  Doctor Visits Discussed/Reviewed Doctor Visits Reviewed, PCP, Specialist  Durable Medical Equipment (DME) Other  [gel pad- [place] [facility]]  PCP/Specialist Visits Compliance with follow-up visit  Communication with PCP/Specialists  [left a message at [employer] medical/ gel pad requested a return call to RN CM or Clydie Braun, wife. They called Clydie Braun & RN CM. RN CM asked pcp to fax gel pad for wheelchair order to [employer] also to see if HTA will assist with cost]  Exercise Interventions   Exercise Discussed/Reviewed Exercise Reviewed, Physical Activity, Assistive device use and maintanence  [updated Mrs Dekam of outreach to [employer] supply, [employer] & pcp office r/t gel pad to prevent further bedsores. It is an out of pocket item if not needed for a purchased wheelchair. His wheelchair not received from [employer], Gel pad $75]  Physical Activity Discussed/Reviewed Physical Activity Reviewed  [[facility] suggested faxing them an order for gel pad needed for wheelchair so they can see if  HTA will pay. The other options are to purchase out of pocket at $75 at [facility] or at [facility] discount supply store & choosing one depending on the costa offered.]  Education Interventions   Education Provided Provided Education  [bedsores, falls, pain management]  Provided Verbal Education On [employer]  Mental Health Interventions   Mental Health Discussed/Reviewed Mental Health Reviewed, Coping Strategies      Interventions Today    Flowsheet Row Most Recent Value  Chronic Disease   Chronic disease during today's visit --  [new falls with rib contusion, bed sores]  General Interventions   General Interventions Discussed/Reviewed General Interventions Reviewed  Doctor Visits Discussed/Reviewed Doctor Visits Reviewed, PCP, Specialist  Durable Medical Equipment (DME) Other  [gel pad- [place] discount [facility]]  PCP/Specialist Visits Compliance with follow-up visit  Communication with PCP/Specialists  [left a message at [employer] medical/ gel pad requested a return call to RN CM or Clydie Braun, wife. They called Clydie Braun & RN CM. RN CM asked pcp to fax gel pad for wheelchair order to [employer] also to see if HTA will assist with cost]  Exercise Interventions   Exercise Discussed/Reviewed Exercise Reviewed, Physical Activity, Assistive device use and maintanence  [updated Mrs Dibert of outreach to [employer] supply, [employer] & pcp office r/t gel pad to prevent further bedsores. It is an out of pocket item if not needed for a purchased wheelchair. His wheelchair not received from [employer], Gel pad $75]  Physical Activity Discussed/Reviewed Physical Activity Reviewed  [[employer] suggested  faxing them an order for gel pad needed for wheelchair so they can see if HTA will pay. The other options are to purchase out of pocket at $75 at [employer] or at [facility] discount supply store & choosing one depending on the costa offered.]  Education Interventions   Education Provided Provided  Education  [bedsores, falls, pain management]  Provided Verbal Education On [employer]  Mental Health Interventions   Mental Health Discussed/Reviewed Mental Health Reviewed, Coping Strategies                Our next appointment is by telephone on 02/21/23 at 1 pm  Please call the care guide team at [phone] if you need to cancel or reschedule your appointment.   If you are experiencing a Mental Health or Behavioral Health Crisis or need someone to talk to, please call the Suicide and Crisis Lifeline: 988 call the [place] National Suicide Prevention Lifeline: 806 317 3382 or TTY: 831-156-7003 TTY 6014626540) to talk to a trained counselor call 1-800-273-TALK (toll free, 24 hour hotline) call the [facility]: 718-020-5104 call 911   Patient verbalizes understanding of instructions and care plan provided today and agrees to view in MyChart. Active MyChart status and patient understanding of how to access instructions and care plan via MyChart confirmed with patient.     The patient has been provided with contact information for the care management team and has been advised to call with any health related questions or concerns.   Rosco Harriott L. Noelle Penner, RN, BSN, [facility] [place]  VBCI Care Management Coordinator  [phone]  Fax: [phone]

## 2023-01-18 ENCOUNTER — Telehealth: Payer: Self-pay

## 2023-01-18 NOTE — Telephone Encounter (Signed)
Rec' msg from K Gibbs/RN w/[facility] re pt: per Odessa Fleming requesting pcp to send Rx for gel pad for pt's wheel chair.   Rx fax over to [employer] this morning.

## 2023-01-21 ENCOUNTER — Telehealth: Payer: Self-pay

## 2023-01-21 ENCOUNTER — Ambulatory Visit: Payer: Self-pay | Admitting: Family Medicine

## 2023-01-21 ENCOUNTER — Other Ambulatory Visit: Payer: Self-pay

## 2023-01-21 DIAGNOSIS — R300 Dysuria: Secondary | ICD-10-CM | POA: Diagnosis not present

## 2023-01-21 DIAGNOSIS — R051 Acute cough: Secondary | ICD-10-CM

## 2023-01-21 DIAGNOSIS — L89300 Pressure ulcer of unspecified buttock, unstageable: Secondary | ICD-10-CM | POA: Diagnosis not present

## 2023-01-21 DIAGNOSIS — R531 Weakness: Secondary | ICD-10-CM | POA: Diagnosis not present

## 2023-01-21 DIAGNOSIS — J189 Pneumonia, unspecified organism: Secondary | ICD-10-CM

## 2023-01-21 MED ORDER — AMOXICILLIN-POT CLAVULANATE 875-125 MG PO TABS: 1.0000 | ORAL_TABLET | Freq: Two times a day (BID) | ORAL | 0 refills | Status: DC

## 2023-01-21 NOTE — Telephone Encounter (Signed)
Spoke with pt's wife, [name], and she states pt's cough is worse. [name] states cough is "wet" and pt's appetite has decreased and pt is weaker. They are continuing to use Mucinex. Offered earliest appointment with you is Thursday but [name] states she is not sure if she can get him in here.  [name] asks what you suggest? Thank you.   Copied from CRM [id number]. Topic: Clinical - Medical Advice >> Jan 21, 2023 11:03 AM Deaijah H wrote: Reason for CRM: Patients wife called in for concerns for husbands health. Patient is experiencing bad cough, loss of appetite, and weakness to stand up. Patient is not currently with wife at the moment. Wife would like call back for advice on what can be done for patient. Patient was seen in ER last Sunday for Pneumonia. Please contact wife at [phone].

## 2023-01-21 NOTE — Telephone Encounter (Signed)
Spoke to patient's wife. Wife stated that she already received a call back from a nurse at the office. Wife stated that they were working on arranging an appointment. After speaking with the wife, I looked back at recent encounters and could see that contact with the office had been made.

## 2023-01-21 NOTE — Telephone Encounter (Signed)
Copied from CRM [id number]. Topic: Clinical - Medical Advice >> Jan 21, 2023  8:36 AM Alona Bene A wrote: Reason for CRM: Patients wife called in for concerns for husbands health. Patient is experiencing bad cough, loss of appetite, and weakness to stand up. Patient is not currently with wife at the moment. Wife would like call back for advice on what can be done for patient. Patient was seen in ER last Sunday for Pneumonia. Please contact wife at [phone].  Attempted to call patient/wife. No answer- left a voicemail providing clinic call back number. Will attempt to reach back out.

## 2023-01-22 ENCOUNTER — Other Ambulatory Visit: Payer: Self-pay

## 2023-01-22 MED ORDER — ESCITALOPRAM OXALATE 10 MG PO TABS: 10.0000 mg | ORAL_TABLET | Freq: Every day | ORAL | 1 refills | Status: DC

## 2023-01-22 NOTE — Telephone Encounter (Signed)
Prescription Request  01/22/2023  LOV: 11/14/22  What is the name of the medication or equipment? escitalopram (LEXAPRO) 10 MG tablet [[id number]]   Have you contacted your pharmacy to request a refill? Yes   Which pharmacy would you like this sent to?  [id number] - [place], [place] - [address] AT [address] [facility] [address] [place] [address] Phone: [phone] Fax: [phone]    Patient notified that their request is being sent to the clinical staff for review and that they should receive a response within 2 business days.   Please advise at [facility] [phone]

## 2023-01-22 NOTE — Telephone Encounter (Signed)
Requested Prescriptions  Pending Prescriptions Disp Refills   escitalopram (LEXAPRO) 10 MG tablet 90 tablet 1    Sig: Take 1 tablet (10 mg total) by mouth daily.     Psychiatry:  Antidepressants - SSRI Failed - 01/22/2023  5:04 PM      Failed - Valid encounter within last 6 months    Recent Outpatient Visits           2 years ago Viral URI   [facility] Tanya Nones, Priscille Heidelberg, MD   2 years ago Hordeolum externum of left upper eyelid   [facility] Valentino Nose, NP   2 years ago Fever, unspecified fever cause   [facility] Pickard, Priscille Heidelberg, MD   2 years ago Sebaceous cyst   [facility] Donita Brooks, MD   2 years ago Itching of ear   [facility] Pickard, Priscille Heidelberg, MD       Future Appointments             In 2 days Pickard, Priscille Heidelberg, MD [facility]

## 2023-01-24 ENCOUNTER — Encounter: Payer: Self-pay | Admitting: Family Medicine

## 2023-01-24 ENCOUNTER — Ambulatory Visit (INDEPENDENT_AMBULATORY_CARE_PROVIDER_SITE_OTHER): Payer: PPO | Admitting: Family Medicine

## 2023-01-24 VITALS — BP 150/82 | HR 63 | Temp 98.8°F | Ht 66.0 in | Wt 175.0 lb

## 2023-01-24 DIAGNOSIS — R051 Acute cough: Secondary | ICD-10-CM

## 2023-01-24 LAB — CBC WITH DIFFERENTIAL/PLATELET
Absolute Lymphocytes: 683 {cells}/uL — ABNORMAL LOW (ref 850–3900)
Absolute Monocytes: 556 {cells}/uL (ref 200–950)
Basophils Absolute: 20 {cells}/uL (ref 0–200)
Basophils Relative: 0.3 %
Eosinophils Absolute: 744 {cells}/uL — ABNORMAL HIGH (ref 15–500)
Eosinophils Relative: 11.1 %
HCT: 33.6 % — ABNORMAL LOW (ref 38.5–50.0)
Hemoglobin: 11.1 g/dL — ABNORMAL LOW (ref 13.2–17.1)
MCH: 29.4 pg (ref 27.0–33.0)
MCHC: 33 g/dL (ref 32.0–36.0)
MCV: 88.9 fL (ref 80.0–100.0)
MPV: 9 fL (ref 7.5–12.5)
Monocytes Relative: 8.3 %
Neutro Abs: 4697 {cells}/uL (ref 1500–7800)
Neutrophils Relative %: 70.1 %
Platelets: 355 10*3/uL (ref 140–400)
RBC: 3.78 10*6/uL — ABNORMAL LOW (ref 4.20–5.80)
RDW: 13.7 % (ref 11.0–15.0)
Total Lymphocyte: 10.2 %
WBC: 6.7 10*3/uL (ref 3.8–10.8)

## 2023-01-24 NOTE — Progress Notes (Signed)
Subjective:    Patient ID: [name], male    DOB: 21-Sep-1933, 89 y.o.   MRN: [id number] Patient was seen in the emergency room on December 2.  CT scan showed patchy tree-in-bud opacities concerning for bronchopneumonia versus airway disease.  He was show doxycycline.  Wife recently called stating that his cough was getting worse prompting me to recommend that he come in for an evaluation.  I did start him on Augmentin while awaiting his arrival.  Today on exam, the patient has crackles throughout both lungs.  He has +1 pitting edema in both legs.  He denies any chest pain.  He denies any shortness of breath but his wife states that the cough is steadily been worsening since December 2.  She feels that the cough may be slightly better on the Augmentin.   Past Medical History:  Diagnosis Date   Acute blood loss anemia    Acute upper respiratory infections of unspecified site 11/04/2012   AKI (acute kidney injury) (HCC)    Alzheimer disease (HCC)    Benign prostatic hyperplasia    CAD (coronary artery disease)    s/p cypher DES to pLAD 6/08; normal LVF;  ETT-Myoview 2009: no ischemia    Clavicle fracture 08/10/2019   Closed displaced fracture of phalanx of left thumb, sequela 08/10/2019   Coronary atherosclerosis of native coronary artery 03/08/2008   Dizziness 03/08/2020   Eosinophilia    Essential hypertension    Fracture of multiple ribs with pain 08/10/2019   Hematoma 11/04/2012   History of multiple falls    History of SAH (subarachnoid hemorrhage) 07/14/2019   Hyperlipidemia type IIB / III 03/08/2008   Itching of ear 07/14/2021   Major neurocognitive disorder due to possible Alzheimer's disease, without behavioral disturbance (HCC) 02/29/2020   MI (myocardial infarction) (HCC)    Moderate persistent asthma    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   Imaging revealed [facility]   Multiple trauma    Nocturnal leg cramps 11/23/2012   Orthostatic  hypotension 03/08/2008   Otalgia of left ear 05/11/2021   Otorrhagia of left ear 03/30/2021   Pain in joint, shoulder region 11/23/2012   Stage 3b chronic kidney disease (HCC)    Thrombocytopenia (HCC)    Trigeminal neuralgia    Type 2 diabetes mellitus (HCC)    Past Surgical History:  Procedure Laterality Date   APPENDECTOMY     CARDIAC CATHETERIZATION  07/30/2006   CORONARY ANGIOPLASTY WITH STENT PLACEMENT   CARDIAC CATHETERIZATION     CATARACT EXTRACTION Right 03/2022   CATARACT EXTRACTION Left 04/2022   EXPLORATORY LAPAROTOMY     age 62   EXPLORATORY LAPAROTOMY     IR THORACENTESIS ASP PLEURAL SPACE W/IMG GUIDE  10/05/2019   Current Outpatient Medications on File Prior to Visit  Medication Sig Dispense Refill   albuterol (PROAIR HFA) 108 (90 Base) MCG/ACT inhaler INHALE 2 PUFFS INTO THE LUNGS EVERY 6 HOURS AS NEEDED FOR WHEEZING OR SHORTNESS OF BREATH (Patient taking differently: Inhale 2 puffs into the lungs every 6 (six) hours as needed for wheezing or shortness of breath. INHALE 2 PUFFS INTO THE LUNGS EVERY 6 HOURS AS NEEDED FOR WHEEZING OR SHORTNESS OF BREATH) 6.7 g 0   amiodarone (PACERONE) 200 MG tablet Take 0.5 tablets (100 mg total) by mouth daily. 45 tablet 3   amLODipine (NORVASC) 5 MG tablet Take 1 tablet (5 mg total) by mouth daily. 90 tablet 3   amoxicillin-clavulanate (AUGMENTIN) 875-125  MG tablet Take 1 tablet by mouth 2 (two) times daily. 14 tablet 0   aspirin EC 81 MG tablet Take 1 tablet (81 mg total) by mouth daily. Swallow whole. 30 tablet 12   atorvastatin (LIPITOR) 20 MG tablet TAKE 1 TABLET BY MOUTH EVERY DAY 90 tablet 1   doxycycline (VIBRAMYCIN) 100 MG capsule Take 1 capsule (100 mg total) by mouth 2 (two) times daily. 20 capsule 0   empagliflozin (JARDIANCE) 10 MG TABS tablet Take 1 tablet (10 mg total) by mouth daily before breakfast. 90 tablet 0   escitalopram (LEXAPRO) 10 MG tablet Take 1 tablet (10 mg total) by mouth daily. 90 tablet 1   ezetimibe  (ZETIA) 10 MG tablet TAKE 1 TABLET BY MOUTH EVERY DAY 90 tablet 3   fluticasone furoate-vilanterol (BREO ELLIPTA) 200-25 MCG/ACT AEPB INHALE 1 PUFF DAILY *NEED APPT* (Patient taking differently: Inhale 1 puff into the lungs daily. INHALE 1 PUFF DAILY *NEED APPT*) 60 each 11   furosemide (LASIX) 40 MG tablet Take 1 tablet (40 mg total) by mouth every other day. (Patient taking differently: Take 40 mg by mouth as needed for edema or fluid.) 45 tablet 3   lidocaine (LIDODERM) 5 % Place 1 patch onto the skin daily. Remove & Discard patch within 12 hours or as directed by MD 30 patch 0   loratadine (CLARITIN) 10 MG tablet Take 10 mg by mouth daily as needed for allergies or rhinitis.      memantine (NAMENDA) 5 MG tablet TAKE 1 TABLET BY MOUTH EVERYDAY AT BEDTIME 90 tablet 1   Multiple Vitamin (MULTIVITAMIN WITH MINERALS) TABS tablet Take 1 tablet by mouth daily.     oxyCODONE-acetaminophen (PERCOCET/ROXICET) 5-325 MG tablet Take 1 tablet by mouth every 6 (six) hours as needed for severe pain (pain score 7-10). 10 tablet 0   No current facility-administered medications on file prior to visit.   Allergies  Allergen Reactions   Gabapentin Other (See Comments)    Visual changes and confusion- "I went blind while I was driving"   Social History   Socioeconomic History   Marital status: Married    Spouse name: Clydie Braun   Number of children: 1   Years of education: 20   Highest education level: Patent examiner History   Occupation: retired professor    Comment: History    Occupation: missionary  Tobacco Use   Smoking status: Never   Smokeless tobacco: Never  Advertising account planner   Vaping status: Never Used  Substance and Sexual Activity   Alcohol use: Not Currently   Drug use: Never   Sexual activity: Yes    Partners: Female  Other Topics Concern   Not on file  Social History Narrative   ** Merged History Encounter **       Lives with his wife (second marriage in 2015, first marriage ended  when his wife died alzheimer's disease). Since his remarriage, his adult daughter doesn't speak with him.      Right handed   Social Drivers of Health   Financial Resource Strain: Low Risk  (02/09/2022)   Overall Financial Resource Strain (CARDIA)    Difficulty of Paying Living Expenses: Not hard at all  Food Insecurity: No Food Insecurity (05/09/2022)   Hunger Vital Sign    Worried About Running Out of Food in the Last Year: Never true    Ran Out of Food in the Last Year: Never true  Transportation Needs: No Transportation Needs (05/21/2022)   PRAPARE -  Administrator, Civil Service (Medical): No    Lack of Transportation (Non-Medical): No  Physical Activity: Not on file  Stress: Not on file  Social Connections: Not on file  Intimate Partner Violence: Not At Risk (05/05/2022)   Humiliation, Afraid, Rape, and Kick questionnaire    Fear of Current or Ex-Partner: No    Emotionally Abused: No    Physically Abused: No    Sexually Abused: No   Family History  Problem Relation Age of Onset   Stomach cancer Mother    Memory loss Mother    Heart disease Father    Coronary artery disease Father 10   Heart attack Father    Heart disease Brother    Aortic aneurysm Brother    Colon cancer Brother    Aortic aneurysm Brother    Colon cancer Brother    Arthritis Daughter        rheumatoid   Coronary artery disease Brother    Esophageal cancer Neg Hx    Rectal cancer Neg Hx      Review of Systems  All other systems reviewed and are negative.      Objective:   Physical Exam Vitals reviewed.  Constitutional:      General: He is not in acute distress.    Appearance: He is well-developed. He is not diaphoretic.  Neck:     Thyroid: No thyromegaly.     Vascular: No carotid bruit or JVD.     Trachea: No tracheal deviation.  Cardiovascular:     Rate and Rhythm: Normal rate and regular rhythm.     Heart sounds: Normal heart sounds. No murmur heard.    No friction rub. No  gallop.  Pulmonary:     Effort: Pulmonary effort is normal. No accessory muscle usage, prolonged expiration or respiratory distress.     Breath sounds: No stridor. Rales present. No wheezing or rhonchi.  Abdominal:     General: Bowel sounds are normal.     Palpations: Abdomen is soft.  Musculoskeletal:     Cervical back: Neck supple. No erythema, rigidity, torticollis or crepitus. No pain with movement, spinous process tenderness or muscular tenderness. Normal range of motion.     Right lower leg: Edema present.     Left lower leg: Edema present.  Lymphadenopathy:     Cervical: No cervical adenopathy.  Neurological:     Mental Status: He is alert.     Motor: No abnormal muscle tone.     Deep Tendon Reflexes: Reflexes are normal and symmetric.           Assessment & Plan:  Acute cough - Plan: BASIC METABOLIC PANEL WITH GFR, CBC with Differential/Platelet Could be community-acquired pneumonia versus pulmonary edema.  Continue Augmentin.  Check CBC CMP.  Start Lasix 40 mg a day.  Recheck on Monday.

## 2023-01-25 LAB — BASIC METABOLIC PANEL WITH GFR
BUN/Creatinine Ratio: 15 (calc) (ref 6–22)
BUN: 33 mg/dL — ABNORMAL HIGH (ref 7–25)
CO2: 22 mmol/L (ref 20–32)
Calcium: 8.5 mg/dL — ABNORMAL LOW (ref 8.6–10.3)
Chloride: 101 mmol/L (ref 98–110)
Creat: 2.19 mg/dL — ABNORMAL HIGH (ref 0.70–1.22)
Glucose, Bld: 123 mg/dL — ABNORMAL HIGH (ref 65–99)
Potassium: 4 mmol/L (ref 3.5–5.3)
Sodium: 134 mmol/L — ABNORMAL LOW (ref 135–146)
eGFR: 28 mL/min/{1.73_m2} — ABNORMAL LOW (ref >=60)

## 2023-01-28 ENCOUNTER — Encounter: Payer: Self-pay | Admitting: Family Medicine

## 2023-02-04 ENCOUNTER — Other Ambulatory Visit: Payer: Self-pay | Admitting: Family Medicine

## 2023-02-04 ENCOUNTER — Other Ambulatory Visit: Payer: PPO

## 2023-02-04 DIAGNOSIS — I10 Essential (primary) hypertension: Secondary | ICD-10-CM | POA: Diagnosis not present

## 2023-02-04 DIAGNOSIS — N1832 Chronic kidney disease, stage 3b: Secondary | ICD-10-CM | POA: Diagnosis not present

## 2023-02-05 ENCOUNTER — Other Ambulatory Visit: Payer: Self-pay | Admitting: Family Medicine

## 2023-02-05 ENCOUNTER — Telehealth: Payer: Self-pay

## 2023-02-05 DIAGNOSIS — I5022 Chronic systolic (congestive) heart failure: Secondary | ICD-10-CM

## 2023-02-05 LAB — COMPREHENSIVE METABOLIC PANEL
AG Ratio: 1 (calc) (ref 1.0–2.5)
ALT: 30 U/L (ref 9–46)
AST: 35 U/L (ref 10–35)
Albumin: 3.6 g/dL (ref 3.6–5.1)
Alkaline phosphatase (APISO): 149 U/L — ABNORMAL HIGH (ref 35–144)
BUN/Creatinine Ratio: 12 (calc) (ref 6–22)
BUN: 37 mg/dL — ABNORMAL HIGH (ref 7–25)
CO2: 32 mmol/L (ref 20–32)
Calcium: 8.8 mg/dL (ref 8.6–10.3)
Chloride: 98 mmol/L (ref 98–110)
Creat: 3.14 mg/dL — ABNORMAL HIGH (ref 0.70–1.22)
Globulin: 3.5 g/dL (ref 1.9–3.7)
Glucose, Bld: 140 mg/dL — ABNORMAL HIGH (ref 65–99)
Potassium: 4.3 mmol/L (ref 3.5–5.3)
Sodium: 139 mmol/L (ref 135–146)
Total Bilirubin: 0.5 mg/dL (ref 0.2–1.2)
Total Protein: 7.1 g/dL (ref 6.1–8.1)

## 2023-02-05 NOTE — Telephone Encounter (Signed)
 Following up pt PAP [employer] and [employer] for Breo and Jardiance ,spoke with pt's wife she explain they both have been sick,they have been  in ER twice and have not have time to fill applications, she will try to do her best to send then out as soon as possible,pt wants to wait until they do there taxes for 2024,explain to pt that it will daley the process.

## 2023-02-07 ENCOUNTER — Telehealth: Payer: Self-pay | Admitting: Family Medicine

## 2023-02-07 ENCOUNTER — Encounter: Payer: Self-pay | Admitting: Family Medicine

## 2023-02-07 ENCOUNTER — Other Ambulatory Visit: Payer: Self-pay | Admitting: Family Medicine

## 2023-02-07 NOTE — Telephone Encounter (Signed)
 Received a fax from [facility] asking for alternative requested: non formulary for the fluticasone-vilanterol 200-25

## 2023-02-08 MED ORDER — FLUTICASONE FUROATE-VILANTEROL 200-25 MCG/ACT IN AEPB: 1.0000 | INHALATION_SPRAY | Freq: Every day | RESPIRATORY_TRACT | 11 refills | Status: DC

## 2023-02-21 ENCOUNTER — Telehealth: Payer: Self-pay

## 2023-02-21 ENCOUNTER — Ambulatory Visit: Payer: Self-pay | Admitting: *Deleted

## 2023-02-21 NOTE — Patient Outreach (Signed)
  Care Coordination   Follow Up Visit Note   05/11/2023 updated note for 02/21/23 Name: [name] MRN: [id number] DOB: 11/21/1933  [name] is a 89 y.o. year old male who sees Pickard, Priscille Heidelberg, MD for primary care. I spoke with  [name] by phone today.  What matters to the patients health and wellness today?  Mobility & incontinence Wife I [place] for daughter emergency Only covered under [employer] not HTA  Palliative care visited on last week  Can hire family members  Dementia grant for caregivers obtained Slipped out of chair- no injury he gets up so fast Back hurts not skin tears  Ambulance came out  Mentioned use of fire dept  Handicap accessible during day  No gel pad got a coccyx pad site healed beautifully new one coming use donut hole    Goals Addressed             This Visit's Progress    Manage home safety, memory concerns- care coordination services        Interventions Today    Flowsheet Row Most Recent Value  Chronic Disease   Chronic disease during today's visit Other  [mobility & incontinence palliative care Dementia, slipped out of chair]  General Interventions   General Interventions Discussed/Reviewed General Interventions Reviewed, [facility], Doctor Visits, Horticulturist, commercial (DME)  Doctor Visits Discussed/Reviewed Doctor Visits Reviewed, PCP, Specialist  Durable Medical Equipment (DME) Other  [donut hole, coccyx gel cushion]  PCP/Specialist Visits Compliance with follow-up visit  Education Interventions   Education Provided Provided Education  Provided Verbal Education On [employer], [employer], When to see the doctor, Other  [facility] coverage vs HTA, palliative care/hospice care, dementia grant for care givers, fire dept for assist if falls skin care protection]  Mental Health Interventions   Mental Health Discussed/Reviewed Mental Health Reviewed, Coping Strategies, Other  Pharmacy Interventions    Pharmacy Dicussed/Reviewed Pharmacy Topics Reviewed, Affording Medications  Safety Interventions   Safety Discussed/Reviewed Safety Reviewed, Fall Risk, Home Safety  Home Safety Assistive Devices  Advanced Directive Interventions   Advanced Directives Discussed/Reviewed Advanced Directives Discussed, Advanced Care Planning  [has most forms in EPIC]              SDOH assessments and interventions completed:  No     Care Coordination Interventions:  Yes, provided   Follow up plan: No further intervention required. Will seek hospice services as needed   Encounter Outcome:  Patient Visit Completed   Cala Bradford L. Noelle Penner, RN, BSN, CCM [place]  [facility] RN Care Manager Direct Dial: [phone]  Fax: [phone] Mailing Address: [address]  [place] [address] Website: [email or web address]

## 2023-02-21 NOTE — Telephone Encounter (Signed)
Copied from CRM [id number]. Topic: General - Other >> Feb 21, 2023  9:17 AM Louie Casa B wrote: Reason for CRM: [facility] is calling in to make known that she vistied the patient 02/20/2023 and both feet are swollen please call back [phone] ask for dee

## 2023-03-04 ENCOUNTER — Ambulatory Visit: Payer: PPO | Admitting: Podiatry

## 2023-03-05 ENCOUNTER — Other Ambulatory Visit: Payer: Self-pay | Admitting: Cardiovascular Disease

## 2023-03-06 ENCOUNTER — Emergency Department (HOSPITAL_COMMUNITY): Payer: PPO

## 2023-03-06 ENCOUNTER — Inpatient Hospital Stay (HOSPITAL_COMMUNITY)
Admission: EM | Admit: 2023-03-06 | Discharge: 2023-03-12 | DRG: 291 | Disposition: A | Discharge status: Home Health | Payer: PPO | Attending: Family Medicine | Admitting: Family Medicine

## 2023-03-06 ENCOUNTER — Inpatient Hospital Stay (HOSPITAL_COMMUNITY): Payer: PPO

## 2023-03-06 ENCOUNTER — Other Ambulatory Visit: Payer: Self-pay | Admitting: Family Medicine

## 2023-03-06 ENCOUNTER — Other Ambulatory Visit: Payer: Self-pay | Admitting: Physician Assistant

## 2023-03-06 ENCOUNTER — Other Ambulatory Visit: Payer: Self-pay

## 2023-03-06 DIAGNOSIS — Z87820 Personal history of traumatic brain injury: Secondary | ICD-10-CM

## 2023-03-06 DIAGNOSIS — Z66 Do not resuscitate: Secondary | ICD-10-CM | POA: Diagnosis present

## 2023-03-06 DIAGNOSIS — N400 Enlarged prostate without lower urinary tract symptoms: Secondary | ICD-10-CM | POA: Diagnosis present

## 2023-03-06 DIAGNOSIS — G309 Alzheimer's disease, unspecified: Secondary | ICD-10-CM | POA: Diagnosis present

## 2023-03-06 DIAGNOSIS — J9601 Acute respiratory failure with hypoxia: Secondary | ICD-10-CM | POA: Diagnosis present

## 2023-03-06 DIAGNOSIS — Z7984 Long term (current) use of oral hypoglycemic drugs: Secondary | ICD-10-CM | POA: Diagnosis not present

## 2023-03-06 DIAGNOSIS — Z751 Person awaiting admission to adequate facility elsewhere: Secondary | ICD-10-CM

## 2023-03-06 DIAGNOSIS — I480 Paroxysmal atrial fibrillation: Secondary | ICD-10-CM | POA: Diagnosis present

## 2023-03-06 DIAGNOSIS — R0602 Shortness of breath: Secondary | ICD-10-CM | POA: Diagnosis present

## 2023-03-06 DIAGNOSIS — I130 Hypertensive heart and chronic kidney disease with heart failure and stage 1 through stage 4 chronic kidney disease, or unspecified chronic kidney disease: Secondary | ICD-10-CM | POA: Diagnosis present

## 2023-03-06 DIAGNOSIS — Z1152 Encounter for screening for COVID-19: Secondary | ICD-10-CM

## 2023-03-06 DIAGNOSIS — Z8249 Family history of ischemic heart disease and other diseases of the circulatory system: Secondary | ICD-10-CM

## 2023-03-06 DIAGNOSIS — Z634 Disappearance and death of family member: Secondary | ICD-10-CM

## 2023-03-06 DIAGNOSIS — Z955 Presence of coronary angioplasty implant and graft: Secondary | ICD-10-CM

## 2023-03-06 DIAGNOSIS — E1122 Type 2 diabetes mellitus with diabetic chronic kidney disease: Secondary | ICD-10-CM | POA: Diagnosis present

## 2023-03-06 DIAGNOSIS — R509 Fever, unspecified: Secondary | ICD-10-CM | POA: Diagnosis present

## 2023-03-06 DIAGNOSIS — Z79899 Other long term (current) drug therapy: Secondary | ICD-10-CM

## 2023-03-06 DIAGNOSIS — Z7951 Long term (current) use of inhaled steroids: Secondary | ICD-10-CM

## 2023-03-06 DIAGNOSIS — F0280 Dementia in other diseases classified elsewhere without behavioral disturbance: Secondary | ICD-10-CM | POA: Diagnosis present

## 2023-03-06 DIAGNOSIS — D631 Anemia in chronic kidney disease: Secondary | ICD-10-CM | POA: Diagnosis present

## 2023-03-06 DIAGNOSIS — E785 Hyperlipidemia, unspecified: Secondary | ICD-10-CM | POA: Diagnosis present

## 2023-03-06 DIAGNOSIS — I10 Essential (primary) hypertension: Secondary | ICD-10-CM | POA: Diagnosis present

## 2023-03-06 DIAGNOSIS — I509 Heart failure, unspecified: Secondary | ICD-10-CM | POA: Diagnosis not present

## 2023-03-06 DIAGNOSIS — N184 Chronic kidney disease, stage 4 (severe): Secondary | ICD-10-CM | POA: Diagnosis present

## 2023-03-06 DIAGNOSIS — R197 Diarrhea, unspecified: Secondary | ICD-10-CM | POA: Diagnosis not present

## 2023-03-06 DIAGNOSIS — J4540 Moderate persistent asthma, uncomplicated: Secondary | ICD-10-CM | POA: Diagnosis present

## 2023-03-06 DIAGNOSIS — G8929 Other chronic pain: Secondary | ICD-10-CM | POA: Diagnosis present

## 2023-03-06 DIAGNOSIS — E782 Mixed hyperlipidemia: Secondary | ICD-10-CM | POA: Diagnosis present

## 2023-03-06 DIAGNOSIS — I2510 Atherosclerotic heart disease of native coronary artery without angina pectoris: Secondary | ICD-10-CM | POA: Diagnosis present

## 2023-03-06 DIAGNOSIS — Z7982 Long term (current) use of aspirin: Secondary | ICD-10-CM

## 2023-03-06 DIAGNOSIS — F0390 Unspecified dementia without behavioral disturbance: Secondary | ICD-10-CM

## 2023-03-06 DIAGNOSIS — R531 Weakness: Secondary | ICD-10-CM | POA: Diagnosis present

## 2023-03-06 DIAGNOSIS — J4489 Other specified chronic obstructive pulmonary disease: Secondary | ICD-10-CM | POA: Diagnosis present

## 2023-03-06 DIAGNOSIS — E119 Type 2 diabetes mellitus without complications: Secondary | ICD-10-CM

## 2023-03-06 DIAGNOSIS — I252 Old myocardial infarction: Secondary | ICD-10-CM

## 2023-03-06 DIAGNOSIS — G500 Trigeminal neuralgia: Secondary | ICD-10-CM | POA: Diagnosis present

## 2023-03-06 DIAGNOSIS — Z888 Allergy status to other drugs, medicaments and biological substances status: Secondary | ICD-10-CM

## 2023-03-06 DIAGNOSIS — I5033 Acute on chronic diastolic (congestive) heart failure: Secondary | ICD-10-CM

## 2023-03-06 DIAGNOSIS — R0902 Hypoxemia: Secondary | ICD-10-CM

## 2023-03-06 DIAGNOSIS — H9201 Otalgia, right ear: Secondary | ICD-10-CM | POA: Diagnosis present

## 2023-03-06 DIAGNOSIS — Z8679 Personal history of other diseases of the circulatory system: Secondary | ICD-10-CM

## 2023-03-06 DIAGNOSIS — Z9181 History of falling: Secondary | ICD-10-CM

## 2023-03-06 LAB — CBC
HCT: 31.8 % — ABNORMAL LOW (ref 39.0–52.0)
HCT: 29.7 % — ABNORMAL LOW (ref 39.0–52.0)
Hemoglobin: 10.4 g/dL — ABNORMAL LOW (ref 13.0–17.0)
Hemoglobin: 9.4 g/dL — ABNORMAL LOW (ref 13.0–17.0)
MCH: 30.1 pg (ref 26.0–34.0)
MCH: 29.7 pg (ref 26.0–34.0)
MCHC: 32.7 g/dL (ref 30.0–36.0)
MCHC: 31.6 g/dL (ref 30.0–36.0)
MCV: 91.9 fL (ref 80.0–100.0)
MCV: 93.7 fL (ref 80.0–100.0)
Platelets: 299 10*3/uL (ref 150–400)
Platelets: 267 10*3/uL (ref 150–400)
RBC: 3.46 MIL/uL — ABNORMAL LOW (ref 4.22–5.81)
RBC: 3.17 MIL/uL — ABNORMAL LOW (ref 4.22–5.81)
RDW: 16 % — ABNORMAL HIGH (ref 11.5–15.5)
RDW: 16 % — ABNORMAL HIGH (ref 11.5–15.5)
WBC: 8 10*3/uL (ref 4.0–10.5)
WBC: 6 10*3/uL (ref 4.0–10.5)
nRBC: 0 % (ref 0.0–0.2)
nRBC: 0 % (ref 0.0–0.2)

## 2023-03-06 LAB — COMPREHENSIVE METABOLIC PANEL
ALT: 29 U/L (ref 0–44)
AST: 40 U/L (ref 15–41)
Albumin: 2.9 g/dL — ABNORMAL LOW (ref 3.5–5.0)
Alkaline Phosphatase: 105 U/L (ref 38–126)
Anion gap: 12 (ref 5–15)
BUN: 36 mg/dL — ABNORMAL HIGH (ref 8–23)
CO2: 20 mmol/L — ABNORMAL LOW (ref 22–32)
Calcium: 8.4 mg/dL — ABNORMAL LOW (ref 8.9–10.3)
Chloride: 103 mmol/L (ref 98–111)
Creatinine, Ser: 2.45 mg/dL — ABNORMAL HIGH (ref 0.61–1.24)
GFR, Estimated: 25 mL/min — ABNORMAL LOW (ref >60)
Glucose, Bld: 115 mg/dL — ABNORMAL HIGH (ref 70–99)
Potassium: 3.9 mmol/L (ref 3.5–5.1)
Sodium: 135 mmol/L (ref 135–145)
Total Bilirubin: 1 mg/dL (ref 0.0–1.2)
Total Protein: 6.6 g/dL (ref 6.5–8.1)

## 2023-03-06 LAB — URINALYSIS, ROUTINE W REFLEX MICROSCOPIC
Bacteria, UA: NONE SEEN
Bilirubin Urine: NEGATIVE
Glucose, UA: >=500 mg/dL — AB
Hgb urine dipstick: NEGATIVE
Ketones, ur: NEGATIVE mg/dL
Leukocytes,Ua: NEGATIVE
Nitrite: NEGATIVE
Protein, ur: 100 mg/dL — AB
Specific Gravity, Urine: 1.01 (ref 1.005–1.030)
pH: 6 (ref 5.0–8.0)

## 2023-03-06 LAB — GLUCOSE, CAPILLARY: Glucose-Capillary: 89 mg/dL (ref 70–99)

## 2023-03-06 LAB — ECHOCARDIOGRAM COMPLETE
AR max vel: 2.65 cm2
AV Peak grad: 10 mm[Hg]
Ao pk vel: 1.58 m/s
Area-P 1/2: 3.65 cm2
Calc EF: 46.4 %
Height: 67 in
S' Lateral: 3.2 cm
Single Plane A2C EF: 47.2 %
Single Plane A4C EF: 46 %
Weight: 2880 [oz_av]

## 2023-03-06 LAB — TROPONIN I (HIGH SENSITIVITY)
Troponin I (High Sensitivity): 63 ng/L — ABNORMAL HIGH (ref <18)
Troponin I (High Sensitivity): 82 ng/L — ABNORMAL HIGH (ref <18)

## 2023-03-06 LAB — CREATININE, SERUM
Creatinine, Ser: 2.3 mg/dL — ABNORMAL HIGH (ref 0.61–1.24)
GFR, Estimated: 27 mL/min — ABNORMAL LOW (ref >60)

## 2023-03-06 LAB — CBG MONITORING, ED: Glucose-Capillary: 123 mg/dL — ABNORMAL HIGH (ref 70–99)

## 2023-03-06 LAB — BRAIN NATRIURETIC PEPTIDE: B Natriuretic Peptide: 857.3 pg/mL — ABNORMAL HIGH (ref 0.0–100.0)

## 2023-03-06 MED ORDER — MEMANTINE HCL 10 MG PO TABS
5.0000 mg | ORAL_TABLET | Freq: Every day | ORAL | Status: DC
  Administered 2023-03-06 – 2023-03-11 (×6): 5 mg via ORAL
  Filled 2023-03-06 (×6): qty 1

## 2023-03-06 MED ORDER — SODIUM CHLORIDE 0.9% FLUSH
3.0000 mL | Freq: Two times a day (BID) | INTRAVENOUS | Status: DC
  Administered 2023-03-06 – 2023-03-12 (×13): 3 mL via INTRAVENOUS

## 2023-03-06 MED ORDER — AMLODIPINE BESYLATE 5 MG PO TABS
5.0000 mg | ORAL_TABLET | Freq: Every day | ORAL | Status: DC
  Administered 2023-03-06 – 2023-03-09 (×4): 5 mg via ORAL
  Filled 2023-03-06 (×4): qty 1

## 2023-03-06 MED ORDER — SODIUM CHLORIDE 0.9% FLUSH: 3.0000 mL | INTRAVENOUS | Status: DC | PRN

## 2023-03-06 MED ORDER — ACETAMINOPHEN 325 MG PO TABS
650.0000 mg | ORAL_TABLET | ORAL | Status: DC | PRN
Start: 2023-03-06 — End: 2023-03-12
  Administered 2023-03-06: 650 mg via ORAL
  Filled 2023-03-06: qty 2

## 2023-03-06 MED ORDER — ATORVASTATIN CALCIUM 10 MG PO TABS
20.0000 mg | ORAL_TABLET | Freq: Every day | ORAL | Status: DC
  Administered 2023-03-06 – 2023-03-12 (×7): 20 mg via ORAL
  Filled 2023-03-06 (×7): qty 2

## 2023-03-06 MED ORDER — ENOXAPARIN SODIUM 30 MG/0.3ML IJ SOSY
30.0000 mg | PREFILLED_SYRINGE | Freq: Every day | INTRAMUSCULAR | Status: DC
  Administered 2023-03-06 – 2023-03-12 (×7): 30 mg via SUBCUTANEOUS
  Filled 2023-03-06 (×7): qty 0.3

## 2023-03-06 MED ORDER — ALBUTEROL SULFATE (2.5 MG/3ML) 0.083% IN NEBU: 3.0000 mL | INHALATION_SOLUTION | Freq: Four times a day (QID) | RESPIRATORY_TRACT | Status: DC | PRN

## 2023-03-06 MED ORDER — AMIODARONE HCL 200 MG PO TABS
100.0000 mg | ORAL_TABLET | Freq: Every day | ORAL | Status: DC
  Administered 2023-03-06 – 2023-03-09 (×4): 100 mg via ORAL
  Filled 2023-03-06 (×4): qty 1

## 2023-03-06 MED ORDER — PERFLUTREN LIPID MICROSPHERE
1.0000 mL | INTRAVENOUS | Status: AC | PRN
  Administered 2023-03-06: 1 mL via INTRAVENOUS

## 2023-03-06 MED ORDER — ESCITALOPRAM OXALATE 10 MG PO TABS
10.0000 mg | ORAL_TABLET | Freq: Every day | ORAL | Status: DC
  Administered 2023-03-06 – 2023-03-12 (×7): 10 mg via ORAL
  Filled 2023-03-06 (×7): qty 1

## 2023-03-06 MED ORDER — INSULIN ASPART 100 UNIT/ML IJ SOLN
0.0000 [IU] | Freq: Three times a day (TID) | INTRAMUSCULAR | Status: DC
  Administered 2023-03-07 – 2023-03-09 (×4): 1 [IU] via SUBCUTANEOUS
  Administered 2023-03-11: 2 [IU] via SUBCUTANEOUS

## 2023-03-06 MED ORDER — ASPIRIN 81 MG PO TBEC
81.0000 mg | DELAYED_RELEASE_TABLET | Freq: Every day | ORAL | Status: DC
  Administered 2023-03-06 – 2023-03-12 (×7): 81 mg via ORAL
  Filled 2023-03-06 (×7): qty 1

## 2023-03-06 MED ORDER — SODIUM CHLORIDE 0.9 % IV SOLN: 250.0000 mL | INTRAVENOUS | Status: AC | PRN

## 2023-03-06 MED ORDER — ONDANSETRON HCL 4 MG/2ML IJ SOLN: 4.0000 mg | Freq: Four times a day (QID) | INTRAMUSCULAR | Status: DC | PRN

## 2023-03-06 MED ORDER — INSULIN ASPART 100 UNIT/ML IJ SOLN
0.0000 [IU] | Freq: Every day | INTRAMUSCULAR | Status: DC
  Administered 2023-03-11: 2 [IU] via SUBCUTANEOUS

## 2023-03-06 MED ORDER — FLUTICASONE FUROATE-VILANTEROL 200-25 MCG/ACT IN AEPB
1.0000 | INHALATION_SPRAY | Freq: Every day | RESPIRATORY_TRACT | Status: DC
  Administered 2023-03-07 – 2023-03-12 (×6): 1 via RESPIRATORY_TRACT
  Filled 2023-03-06 (×2): qty 28

## 2023-03-06 MED ORDER — FUROSEMIDE 10 MG/ML IJ SOLN
40.0000 mg | Freq: Two times a day (BID) | INTRAMUSCULAR | Status: DC
  Administered 2023-03-06 – 2023-03-08 (×4): 40 mg via INTRAVENOUS
  Filled 2023-03-06 (×4): qty 4

## 2023-03-06 MED ORDER — EZETIMIBE 10 MG PO TABS
10.0000 mg | ORAL_TABLET | Freq: Every day | ORAL | Status: DC
  Administered 2023-03-06 – 2023-03-12 (×7): 10 mg via ORAL
  Filled 2023-03-06 (×7): qty 1

## 2023-03-06 NOTE — ED Notes (Addendum)
Dr. Jacqulyn Bath nofied of elevated temp and increased troponin level

## 2023-03-06 NOTE — ED Notes (Signed)
Dr. Jacqulyn Bath at bedside

## 2023-03-06 NOTE — H&P (Signed)
History and Physical    [name] JYN:[id number] DOB: 08-05-1933 DOA: 03/06/2023  PCP: Donita Brooks, MD  Patient coming from: Home  I have personally briefly reviewed patient's old medical records in [facility]  Chief Complaint: Shortness of breath  HPI: [name] is a 89 y.o. male with medical history significant of CAD, diabetes, diastolic CHF, hyperlipidemia, dementia, paroxysmal atrial fibrillation, CKD stage IV, anemia of chronic disease was brought into the ED by wife due to concern of shortness of breath that has been ongoing for about a week.  Patient endorsed having increased shortness of breath with laying flat but not with exertion.  He also said that he was having burning urination a week ago but not now but his wife told me that he was still complaining of shortness of breath and patient tends to forget due to dementia.  He did not have any fever, he denies any other complaints at the moment.  Patient's friend or some family member was at the bedside, she called the wife and the wife was on the speaker phone during the whole time.  ED Course: Upon arrival to ED, he was slightly hypertensive but otherwise stable.  Patient was diagnosed to have pulmonary edema/CHF exacerbation based on elevated BNP, hypoxia and chest x-ray findings.  Hospital service were called for admission for further management of CHF exacerbation.  Patient had not received any medications in the ED.  Review of Systems: As per HPI otherwise negative.    Past Medical History:  Diagnosis Date   Acute blood loss anemia    Acute upper respiratory infections of unspecified site 11/04/2012   AKI (acute kidney injury) (HCC)    Alzheimer disease (HCC)    Benign prostatic hyperplasia    CAD (coronary artery disease)    s/p cypher DES to pLAD 6/08; normal LVF;  ETT-Myoview 2009: no ischemia    Clavicle fracture 08/10/2019   Closed displaced fracture of phalanx of left thumb, sequela  08/10/2019   Coronary atherosclerosis of native coronary artery 03/08/2008   Dizziness 03/08/2020   Eosinophilia    Essential hypertension    Fracture of multiple ribs with pain 08/10/2019   Hematoma 11/04/2012   History of multiple falls    History of SAH (subarachnoid hemorrhage) 07/14/2019   Hyperlipidemia type IIB / III 03/08/2008   Itching of ear 07/14/2021   Major neurocognitive disorder due to possible Alzheimer's disease, without behavioral disturbance (HCC) 02/29/2020   MI (myocardial infarction) (HCC)    Moderate persistent asthma    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   Imaging revealed [facility]   Multiple trauma    Nocturnal leg cramps 11/23/2012   Orthostatic hypotension 03/08/2008   Otalgia of left ear 05/11/2021   Otorrhagia of left ear 03/30/2021   Pain in joint, shoulder region 11/23/2012   Stage 3b chronic kidney disease (HCC)    Thrombocytopenia (HCC)    Trigeminal neuralgia    Type 2 diabetes mellitus (HCC)     Past Surgical History:  Procedure Laterality Date   APPENDECTOMY     CARDIAC CATHETERIZATION  07/30/2006   CORONARY ANGIOPLASTY WITH STENT PLACEMENT   CARDIAC CATHETERIZATION     CATARACT EXTRACTION Right 03/2022   CATARACT EXTRACTION Left 04/2022   EXPLORATORY LAPAROTOMY     age 68   EXPLORATORY LAPAROTOMY     IR THORACENTESIS ASP PLEURAL SPACE W/IMG GUIDE  10/05/2019     reports that he has never smoked.  He has never used smokeless tobacco. He reports that he does not currently use alcohol. He reports that he does not use drugs.  Allergies  Allergen Reactions   Gabapentin Other (See Comments)    Visual changes and confusion- "I went blind while I was driving"    Family History  Problem Relation Age of Onset   Stomach cancer Mother    Memory loss Mother    Heart disease Father    Coronary artery disease Father 52   Heart attack Father    Heart disease Brother    Aortic aneurysm Brother    Colon cancer  Brother    Aortic aneurysm Brother    Colon cancer Brother    Arthritis Daughter        rheumatoid   Coronary artery disease Brother    Esophageal cancer Neg Hx    Rectal cancer Neg Hx     Prior to Admission medications   Medication Sig Start Date End Date Taking? Authorizing Provider  acetaminophen (TYLENOL) 500 MG tablet Take 1,000 mg by mouth as needed for headache or mild pain (pain score 1-3).   Yes [provider]  albuterol (PROAIR HFA) 108 (90 Base) MCG/ACT inhaler INHALE 2 PUFFS INTO THE LUNGS EVERY 6 HOURS AS NEEDED FOR WHEEZING OR SHORTNESS OF BREATH Patient taking differently: Inhale 1-2 puffs into the lungs every 6 (six) hours as needed for shortness of breath. INHALE 2 PUFFS INTO THE LUNGS EVERY 6 HOURS AS NEEDED FOR WHEEZING OR SHORTNESS OF BREATH 08/06/19  Yes Angiulli, Mcarthur Rossetti, PA-C  amiodarone (PACERONE) 200 MG tablet Take 0.5 tablets (100 mg total) by mouth daily. 01/07/23  Yes Tonny Bollman, MD  amLODipine (NORVASC) 5 MG tablet Take 1 tablet (5 mg total) by mouth daily. Patient taking differently: Take 5 mg by mouth at bedtime. 11/01/22  Yes Donita Brooks, MD  aspirin EC 81 MG tablet Take 1 tablet (81 mg total) by mouth daily. Swallow whole. 05/03/22  Yes Almon Hercules, MD  atorvastatin (LIPITOR) 20 MG tablet TAKE 1 TABLET BY MOUTH EVERY DAY 11/02/22  Yes Donita Brooks, MD  empagliflozin (JARDIANCE) 10 MG TABS tablet Take 1 tablet (10 mg total) by mouth daily before breakfast. 12/31/22  Yes Pickard, Priscille Heidelberg, MD  escitalopram (LEXAPRO) 10 MG tablet Take 1 tablet (10 mg total) by mouth daily. 01/22/23  Yes Donita Brooks, MD  ezetimibe (ZETIA) 10 MG tablet TAKE 1 TABLET BY MOUTH EVERY DAY 05/23/22  Yes Tonny Bollman, MD  fluticasone furoate-vilanterol (BREO ELLIPTA) 200-25 MCG/ACT AEPB Inhale 1 puff into the lungs daily. 02/08/23  Yes Donita Brooks, MD  memantine (NAMENDA) 5 MG tablet TAKE 1 TABLET BY MOUTH EVERYDAY AT BEDTIME 11/20/22  Yes Marcos Eke, PA-C  Multiple Vitamin (MULTIVITAMIN WITH MINERALS) TABS tablet Take 1 tablet by mouth daily.   Yes [provider]  furosemide (LASIX) 40 MG tablet Take 1 tablet (40 mg total) by mouth every other day. Patient not taking: Reported on 03/06/2023 12/25/21   Tonny Bollman, MD    Physical Exam: Vitals:   03/06/23 1000 03/06/23 1005 03/06/23 1200 03/06/23 1230  BP: (!) 136/57  122/88   Pulse: 70 69 72 63  Resp: (!) 22 17 (!) 21 14  Temp:      TempSrc:      SpO2: 93% 96% 94% 96%  Weight:      Height:        Constitutional: NAD, calm, comfortable Vitals:  03/06/23 1000 03/06/23 1005 03/06/23 1200 03/06/23 1230  BP: (!) 136/57  122/88   Pulse: 70 69 72 63  Resp: (!) 22 17 (!) 21 14  Temp:      TempSrc:      SpO2: 93% 96% 94% 96%  Weight:      Height:       Eyes: PERRL, lids and conjunctivae normal ENMT: Mucous membranes are moist. Posterior pharynx clear of any exudate or lesions.Normal dentition.  Neck: normal, supple, no masses, no thyromegaly Respiratory: Bilateral crackles, no wheezes or rhonchi. Cardiovascular: Regular rate and rhythm, no murmurs / rubs / gallops. No extremity edema. 2+ pedal pulses. No carotid bruits.  Abdomen: no tenderness, no masses palpated. No hepatosplenomegaly. Bowel sounds positive.  Musculoskeletal: no clubbing / cyanosis. No joint deformity upper and lower extremities. Good ROM, no contractures. Normal muscle tone.  Skin: no rashes, lesions, ulcers. No induration Neurologic: CN 2-12 grossly intact. Sensation intact, DTR normal. Strength 5/5 in all 4.  Psychiatric: Poor judgment and insight. Alert and oriented x 1. Normal mood.    Labs on Admission: I have personally reviewed following labs and imaging studies  CBC: Recent Labs  Lab 03/06/23 0330 03/06/23 0846  WBC 8.0 6.0  HGB 10.4* 9.4*  HCT 31.8* 29.7*  MCV 91.9 93.7  PLT 299 267   Basic Metabolic Panel: Recent Labs  Lab 03/06/23 0330 03/06/23 0846  NA 135  --   K  3.9  --   CL 103  --   CO2 20*  --   GLUCOSE 115*  --   BUN 36*  --   CREATININE 2.45* 2.30*  CALCIUM 8.4*  --    GFR: Estimated Creatinine Clearance: 23.1 mL/min (A) (by C-G formula based on SCr of 2.3 mg/dL (H)). Liver Function Tests: Recent Labs  Lab 03/06/23 0330  AST 40  ALT 29  ALKPHOS 105  BILITOT 1.0  PROT 6.6  ALBUMIN 2.9*   No results for input(s): "LIPASE", "AMYLASE" in the last 168 hours. No results for input(s): "AMMONIA" in the last 168 hours. Coagulation Profile: No results for input(s): "INR", "PROTIME" in the last 168 hours. Cardiac Enzymes: No results for input(s): "CKTOTAL", "CKMB", "CKMBINDEX", "TROPONINI" in the last 168 hours. BNP (last 3 results) No results for input(s): "PROBNP" in the last 8760 hours. HbA1C: No results for input(s): "HGBA1C" in the last 72 hours. CBG: No results for input(s): "GLUCAP" in the last 168 hours. Lipid Profile: No results for input(s): "CHOL", "HDL", "LDLCALC", "TRIG", "CHOLHDL", "LDLDIRECT" in the last 72 hours. Thyroid Function Tests: No results for input(s): "TSH", "T4TOTAL", "FREET4", "T3FREE", "THYROIDAB" in the last 72 hours. Anemia Panel: No results for input(s): "VITAMINB12", "FOLATE", "FERRITIN", "TIBC", "IRON", "RETICCTPCT" in the last 72 hours. Urine analysis:    Component Value Date/Time   COLORURINE YELLOW 03/06/2023 0714   APPEARANCEUR CLEAR 03/06/2023 0714   LABSPEC 1.010 03/06/2023 0714   PHURINE 6.0 03/06/2023 0714   GLUCOSEU >=500 (A) 03/06/2023 0714   HGBUR NEGATIVE 03/06/2023 0714   BILIRUBINUR NEGATIVE 03/06/2023 0714   BILIRUBINUR neg 09/28/2012 0832   KETONESUR NEGATIVE 03/06/2023 0714   PROTEINUR 100 (A) 03/06/2023 0714   UROBILINOGEN 0.2 09/28/2012 0832   NITRITE NEGATIVE 03/06/2023 0714   LEUKOCYTESUR NEGATIVE 03/06/2023 0714    Radiological Exams on Admission: DG Chest Port 1 View Result Date: 03/06/2023 CLINICAL DATA:  Shortness of breath EXAM: PORTABLE CHEST 1 VIEW COMPARISON:   10/11/2022 FINDINGS: Diffuse interstitial opacity above prior baseline with some patchy  densities especially on the right as well. Chronic cardiomegaly. Subpleural opacity in the left lower lung, underestimated relative to prior CT which also shows a chronic, loculated pleural effusion. No pneumothorax. Remote left rib fractures. Artifact from EKG leads. IMPRESSION: 1. Both interstitial and airspace opacity when compared to prior, pulmonary edema or pneumonia are both possible. 2. Chronic airspace disease and pleural fluid at the left base. Electronically Signed   By: Tiburcio Pea M.D.   On: 03/06/2023 04:20    EKG: Independently reviewed.  Sinus rhythm with no acute ST-T wave changes  Assessment/Plan Principal Problem:   Acute on chronic congestive heart failure (HCC) Active Problems:   Hyperlipidemia type IIB / III   Coronary atherosclerosis of native coronary artery   Type 2 diabetes mellitus (HCC)   Essential hypertension   CAD (coronary artery disease)   Fever   Acute hypoxic respiratory failure secondary to acute on chronic congestive heart failure with preserved ejection fraction/acute pulmonary edema, POA: Patient requiring 2 L of oxygen.  No medications given in the ED.  Has some crackles on the lung exam.  Will start on Lasix IV 40 mg twice daily with a strict I's and O's, low-sodium diet and daily weight and monitor on telemetry.  Will hold Jardiance for now.  Fever: Patient developed fever of 100.7 after admission at 7:18 AM.  Complaining of dysuria.  Checking UA.  Paroxysmal atrial fibrillation: Currently in sinus rhythm.  Not on any anticoagulation.  Resume amiodarone.  Dyslipidemia/hyperlipidemia: Resume statins/Zetia.  Essential hypertension: Slightly elevated.  Resume amlodipine.  COPD: Stable.  Resume home dose of Breo Ellipta.  Type 2 diabetes mellitus: Last hemoglobin A1c available in chart is from March 2024 and was 6.2.  He is on Jardiance PTA.  Will start on  SSI.  Alzheimer's dementia: Resume Namenda.  DVT prophylaxis: enoxaparin (LOVENOX) injection 30 mg Start: 03/06/23 1000Lovenox Code Status: Full code Family Communication: Family member or friend  present at bedside.  Plan of care discussed with patient's wife over the phone. Disposition Plan: May discharge in 1 to 2 days based on clinical progress Consults called: None  Hughie Closs MD [facility]  *Please note that this is a verbal dictation therefore any spelling or grammatical errors are due to the "[employer] One" system interpretation.  Please page via Amion and do not message via secure chat for urgent patient care matters. Secure chat can be used for non urgent patient care matters. 03/06/2023, 1:01 PM  To contact the attending provider between 7A-7P or the covering provider during after hours 7P-7A, please log into the web site [email or web address]

## 2023-03-06 NOTE — ED Notes (Signed)
ED TO INPATIENT HANDOFF REPORT  ED Nurse Name and Phone #: Yancey Flemings [phone]  S Name/Age/Gender [name] 89 y.o. male Room/Bed: 047C/047C  Code Status   Code Status: Limited: Do not attempt resuscitation (DNR) -DNR-LIMITED -Do Not Intubate/DNI   Home/SNF/Other Home Patient oriented to: self and place Is this baseline? Yes   Triage Complete: Triage complete  Chief Complaint Acute on chronic congestive heart failure (HCC) [I50.9]  Triage Note Pt BIB [facility] from home. Pt started having SOB yesterday that got progressively worse over night. On EMS arrival pt was 87% on room air, pt placed on 2L [place] and O2 increased to 98%. Pt has hx of CHF and is suppose to take diuretic but has recently stopped her his PCP due to kidney disease. Pts wife informed EMS that pt has had some burning and increased urination.    Allergies Allergies  Allergen Reactions   Gabapentin Other (See Comments)    Visual changes and confusion- "I went blind while I was driving"    Level of Care/Admitting Diagnosis ED Disposition     ED Disposition  Admit   Condition  --   Comment  Hospital Area: [facility] [[id number]]  Level of Care: Telemetry Cardiac [[id number]]  May admit patient to [facility] or [facility] if equivalent level of care is available:: Yes  Covid Evaluation: Asymptomatic - no recent exposure (last 10 days) testing not required  Diagnosis: Acute on chronic congestive heart failure [facility]) [[id number]]  Admitting Physician: Darlin Drop [[id number]]  Attending Physician: Darlin Drop [[id number]]  Certification:: I certify this patient will need inpatient services for at least 2 midnights  Expected Medical Readiness: 03/08/2023          B Medical/Surgery History Past Medical History:  Diagnosis Date   Acute blood loss anemia    Acute upper respiratory infections of unspecified site 11/04/2012   AKI (acute kidney injury) (HCC)    Alzheimer disease (HCC)     Benign prostatic hyperplasia    CAD (coronary artery disease)    s/p cypher DES to pLAD 6/08; normal LVF;  ETT-Myoview 2009: no ischemia    Clavicle fracture 08/10/2019   Closed displaced fracture of phalanx of left thumb, sequela 08/10/2019   Coronary atherosclerosis of native coronary artery 03/08/2008   Dizziness 03/08/2020   Eosinophilia    Essential hypertension    Fracture of multiple ribs with pain 08/10/2019   Hematoma 11/04/2012   History of multiple falls    History of SAH (subarachnoid hemorrhage) 07/14/2019   Hyperlipidemia type IIB / III 03/08/2008   Itching of ear 07/14/2021   Major neurocognitive disorder due to possible Alzheimer's disease, without behavioral disturbance (HCC) 02/29/2020   MI (myocardial infarction) (HCC)    Moderate persistent asthma    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   Imaging revealed [facility]   Multiple trauma    Nocturnal leg cramps 11/23/2012   Orthostatic hypotension 03/08/2008   Otalgia of left ear 05/11/2021   Otorrhagia of left ear 03/30/2021   Pain in joint, shoulder region 11/23/2012   Stage 3b chronic kidney disease (HCC)    Thrombocytopenia (HCC)    Trigeminal neuralgia    Type 2 diabetes mellitus (HCC)    Past Surgical History:  Procedure Laterality Date   APPENDECTOMY     CARDIAC CATHETERIZATION  07/30/2006   CORONARY ANGIOPLASTY WITH STENT PLACEMENT   CARDIAC CATHETERIZATION     CATARACT EXTRACTION Right 03/2022  CATARACT EXTRACTION Left 04/2022   EXPLORATORY LAPAROTOMY     age 30   EXPLORATORY LAPAROTOMY     IR THORACENTESIS ASP PLEURAL SPACE W/IMG GUIDE  10/05/2019     A IV Location/Drains/Wounds Patient Lines/Drains/Airways Status     Active Line/Drains/Airways     Name Placement date Placement time Site Days   Peripheral IV 03/06/23 20 G Left Forearm 03/06/23  --  Forearm  less than 1            Intake/Output Last 24 hours No intake or output data in the 24 hours  ending 03/06/23 1859  Labs/Imaging Results for orders placed or performed during the hospital encounter of 03/06/23 (from the past 48 hours)  CBC     Status: Abnormal   Collection Time: 03/06/23  3:30 AM  Result Value Ref Range   WBC 8.0 4.0 - 10.5 K/uL   RBC 3.46 (L) 4.22 - 5.81 MIL/uL   Hemoglobin 10.4 (L) 13.0 - 17.0 g/dL   HCT 47.4 (L) 25.9 - 56.3 %   MCV 91.9 80.0 - 100.0 fL   MCH 30.1 26.0 - 34.0 pg   MCHC 32.7 30.0 - 36.0 g/dL   RDW 87.5 (H) 64.3 - 32.9 %   Platelets 299 150 - 400 K/uL   nRBC 0.0 0.0 - 0.2 %    Comment: Performed at [facility], [address], [place], [place] [address]  Comprehensive metabolic panel     Status: Abnormal   Collection Time: 03/06/23  3:30 AM  Result Value Ref Range   Sodium 135 135 - 145 mmol/L   Potassium 3.9 3.5 - 5.1 mmol/L   Chloride 103 98 - 111 mmol/L   CO2 20 (L) 22 - 32 mmol/L   Glucose, Bld 115 (H) 70 - 99 mg/dL    Comment: Glucose reference range applies only to samples taken after fasting for at least 8 hours.   BUN 36 (H) 8 - 23 mg/dL   Creatinine, Ser 1.66 (H) 0.61 - 1.24 mg/dL   Calcium 8.4 (L) 8.9 - 10.3 mg/dL   Total Protein 6.6 6.5 - 8.1 g/dL   Albumin 2.9 (L) 3.5 - 5.0 g/dL   AST 40 15 - 41 U/L   ALT 29 0 - 44 U/L   Alkaline Phosphatase 105 38 - 126 U/L   Total Bilirubin 1.0 0.0 - 1.2 mg/dL   GFR, Estimated 25 (L) >60 mL/min    Comment: (NOTE) Calculated using the CKD-EPI Creatinine Equation (2021)    Anion gap 12 5 - 15    Comment: Performed at [facility], [address], [place], [place] [address]  Troponin I (High Sensitivity)     Status: Abnormal   Collection Time: 03/06/23  3:30 AM  Result Value Ref Range   Troponin I (High Sensitivity) 63 (H) <18 ng/L    Comment: (NOTE) Elevated high sensitivity troponin I (hsTnI) values and significant  changes across serial measurements may suggest ACS but many other  chronic and acute conditions are known to elevate hsTnI results.  Refer to the "Links"  section for chest pain algorithms and additional  guidance. Performed at [facility], [address], [place], [place] [address]   Brain natriuretic peptide     Status: Abnormal   Collection Time: 03/06/23  3:30 AM  Result Value Ref Range   B Natriuretic Peptide 857.3 (H) 0.0 - 100.0 pg/mL    Comment: Performed at [facility], [address] [place], [place] [address]  Troponin I (High Sensitivity)     Status: Abnormal   Collection Time: 03/06/23  6:40 AM  Result Value Ref Range   Troponin I (High Sensitivity) 82 (H) <18 ng/L    Comment: RESULT CALLED TO, READ BACK BY AND VERIFIED WITH H.VANKRETSCHMAR,RN 0754 03/06/23 CLARK,S (NOTE) Elevated high sensitivity troponin I (hsTnI) values and significant  changes across serial measurements may suggest ACS but many other  chronic and acute conditions are known to elevate hsTnI results.  Refer to the "Links" section for chest pain algorithms and additional  guidance. Performed at [facility], [address] [place], [place] [address]   Urinalysis, Routine w reflex microscopic -Urine, Clean Catch     Status: Abnormal   Collection Time: 03/06/23  7:14 AM  Result Value Ref Range   Color, Urine YELLOW YELLOW   APPearance CLEAR CLEAR   Specific Gravity, Urine 1.010 1.005 - 1.030   pH 6.0 5.0 - 8.0   Glucose, UA >=500 (A) NEGATIVE mg/dL   Hgb urine dipstick NEGATIVE NEGATIVE   Bilirubin Urine NEGATIVE NEGATIVE   Ketones, ur NEGATIVE NEGATIVE mg/dL   Protein, ur 706 (A) NEGATIVE mg/dL   Nitrite NEGATIVE NEGATIVE   Leukocytes,Ua NEGATIVE NEGATIVE   RBC / HPF 0-5 0 - 5 RBC/hpf   WBC, UA 0-5 0 - 5 WBC/hpf   Bacteria, UA NONE SEEN NONE SEEN   Squamous Epithelial / HPF 0-5 0 - 5 /HPF    Comment: Performed at [facility], [address], [place], [place] [address]  CBC     Status: Abnormal   Collection Time: 03/06/23  8:46 AM  Result Value Ref Range   WBC 6.0 4.0 - 10.5 K/uL   RBC 3.17 (L) 4.22 - 5.81 MIL/uL    Hemoglobin 9.4 (L) 13.0 - 17.0 g/dL   HCT 83.1 (L) 51.7 - 61.6 %   MCV 93.7 80.0 - 100.0 fL   MCH 29.7 26.0 - 34.0 pg   MCHC 31.6 30.0 - 36.0 g/dL   RDW 07.3 (H) 71.0 - 62.6 %   Platelets 267 150 - 400 K/uL   nRBC 0.0 0.0 - 0.2 %    Comment: Performed at [facility], [address] [place], [place] [address]  Creatinine, serum     Status: Abnormal   Collection Time: 03/06/23  8:46 AM  Result Value Ref Range   Creatinine, Ser 2.30 (H) 0.61 - 1.24 mg/dL   GFR, Estimated 27 (L) >60 mL/min    Comment: (NOTE) Calculated using the CKD-EPI Creatinine Equation (2021) Performed at [facility], [address] [place], [place] [address]   CBG monitoring, ED     Status: Abnormal   Collection Time: 03/06/23  5:07 PM  Result Value Ref Range   Glucose-Capillary 123 (H) 70 - 99 mg/dL    Comment: Glucose reference range applies only to samples taken after fasting for at least 8 hours.   *Note: Due to a large number of results and/or encounters for the requested time period, some results have not been displayed. A complete set of results can be found in Results Review.   ECHOCARDIOGRAM COMPLETE Result Date: 03/06/2023    ECHOCARDIOGRAM REPORT   Patient Name:   [name] Date of Exam: 03/06/2023 Medical Rec #:  [id number]          Height:       67.0 in Accession #:  [id number]         Weight:       180.0 lb Date of Birth:  1933/07/12          BSA:          1.934 m Patient Age:    89 years           BP:           141/64 mmHg Patient Gender: M                  HR:           62 bpm. Exam Location:  Inpatient Procedure: 2D Echo, Color Doppler, Cardiac Doppler and Intracardiac            Opacification Agent Indications:    CHF  History:        Patient has prior history of Echocardiogram examinations, most                 recent 01/10/2022. CAD and Previous Myocardial Infarction,                 Arrythmias:Atrial Fibrillation; Risk Factors:Hypertension and                 Diabetes.   Sonographer:    Webb Laws Referring Phys: [id number] RAVI PAHWANI IMPRESSIONS  1. Left ventricular ejection fraction, by estimation, is 45 to 50%. The left ventricle has mildly decreased function. The left ventricle has no regional wall motion abnormalities. Left ventricular diastolic parameters are consistent with Grade III diastolic dysfunction (restrictive).  2. Right ventricular systolic function is normal. The right ventricular size is normal.  3. Left atrial size was mildly dilated.  4. The mitral valve is normal in structure. Trivial mitral valve regurgitation. No evidence of mitral stenosis. Moderate mitral annular calcification.  5. The aortic valve is normal in structure. There is mild calcification of the aortic valve. Aortic valve regurgitation is trivial. Aortic valve sclerosis/calcification is present, without any evidence of aortic stenosis. FINDINGS  Left Ventricle: Left ventricular ejection fraction, by estimation, is 45 to 50%. The left ventricle has mildly decreased function. The left ventricle has no regional wall motion abnormalities. Definity contrast agent was given IV to delineate the left ventricular endocardial borders. The left ventricular internal cavity size was normal in size. There is no left ventricular hypertrophy. Left ventricular diastolic parameters are consistent with Grade III diastolic dysfunction (restrictive). Right Ventricle: The right ventricular size is normal. No increase in right ventricular wall thickness. Right ventricular systolic function is normal. Left Atrium: Left atrial size was mildly dilated. Right Atrium: Right atrial size was normal in size. Pericardium: There is no evidence of pericardial effusion. Mitral Valve: The mitral valve is normal in structure. Moderate mitral annular calcification. Trivial mitral valve regurgitation. No evidence of mitral valve stenosis. Tricuspid Valve: The tricuspid valve is normal in structure. Tricuspid valve regurgitation  is trivial. No evidence of tricuspid stenosis. Aortic Valve: The aortic valve is normal in structure. There is mild calcification of the aortic valve. Aortic valve regurgitation is trivial. Aortic valve sclerosis/calcification is present, without any evidence of aortic stenosis. Aortic valve peak gradient measures 10.0 mmHg. Pulmonic Valve: The pulmonic valve was normal in structure. Pulmonic valve regurgitation is not visualized. No evidence of pulmonic stenosis. Aorta: The aortic root is normal in size and structure. Venous: The inferior vena cava was not well visualized. IAS/Shunts: No atrial level shunt detected by color flow Doppler.  LEFT VENTRICLE PLAX  2D LVIDd:         4.40 cm      Diastology LVIDs:         3.20 cm      LV e' medial:    4.46 cm/s LV PW:         1.40 cm      LV E/e' medial:  22.9 LV IVS:        2.00 cm      LV e' lateral:   5.66 cm/s LVOT diam:     2.20 cm      LV E/e' lateral: 18.0 LV SV:         91 LV SV Index:   47 LVOT Area:     3.80 cm  LV Volumes (MOD) LV vol d, MOD A2C: 151.0 ml LV vol d, MOD A4C: 135.0 ml LV vol s, MOD A2C: 79.8 ml LV vol s, MOD A4C: 72.9 ml LV SV MOD A2C:     71.2 ml LV SV MOD A4C:     135.0 ml LV SV MOD BP:      67.7 ml RIGHT VENTRICLE RV Basal diam:  3.00 cm RV S prime:     13.60 cm/s TAPSE (M-mode): 1.9 cm LEFT ATRIUM             Index        RIGHT ATRIUM           Index LA diam:        3.50 cm 1.81 cm/m   RA Area:     11.40 cm LA Vol (A2C):   53.7 ml 27.77 ml/m  RA Volume:   17.70 ml  9.15 ml/m LA Vol (A4C):   44.9 ml 23.22 ml/m LA Biplane Vol: 51.8 ml 26.79 ml/m  AORTIC VALVE AV Area (Vmax): 2.65 cm AV Vmax:        158.00 cm/s AV Peak Grad:   10.0 mmHg LVOT Vmax:      110.00 cm/s LVOT Vmean:     77.300 cm/s LVOT VTI:       0.239 m  AORTA Ao Root diam: 3.10 cm Ao Asc diam:  3.60 cm MITRAL VALVE MV Area (PHT): 3.65 cm     SHUNTS MV Decel Time: 208 msec     Systemic VTI:  0.24 m MV E velocity: 102.00 cm/s  Systemic Diam: 2.20 cm MV A velocity: 57.70 cm/s  MV E/A ratio:  1.77 Arvilla Meres MD Electronically signed by Arvilla Meres MD Signature Date/Time: 03/06/2023/3:04:15 PM    Final    DG Chest Port 1 View Result Date: 03/06/2023 CLINICAL DATA:  Shortness of breath EXAM: PORTABLE CHEST 1 VIEW COMPARISON:  10/11/2022 FINDINGS: Diffuse interstitial opacity above prior baseline with some patchy densities especially on the right as well. Chronic cardiomegaly. Subpleural opacity in the left lower lung, underestimated relative to prior CT which also shows a chronic, loculated pleural effusion. No pneumothorax. Remote left rib fractures. Artifact from EKG leads. IMPRESSION: 1. Both interstitial and airspace opacity when compared to prior, pulmonary edema or pneumonia are both possible. 2. Chronic airspace disease and pleural fluid at the left base. Electronically Signed   By: Tiburcio Pea M.D.   On: 03/06/2023 04:20    Pending Labs Unresulted Labs (From admission, onward)     Start     Ordered   03/13/23 0500  Creatinine, serum  (enoxaparin (LOVENOX)    CrCl < 30 ml/min)  Once,   R  Comments: while on enoxaparin therapy.    03/06/23 0846   03/07/23 0500  Basic metabolic panel  Daily,   R     Comments: As Scheduled for 5 days    03/06/23 0846   03/06/23 0848  Hemoglobin A1c  Once,   R       Comments: To assess prior glycemic control    03/06/23 0847            Vitals/Pain Today's Vitals   03/06/23 1430 03/06/23 1514 03/06/23 1517 03/06/23 1839  BP: (!) 141/60  (!) 112/49 136/60  Pulse: 67  62 63  Resp: 19  18 19   Temp:   97.9 F (36.6 C) 98.1 F (36.7 C)  TempSrc:   Oral Oral  SpO2: 94%  95% 96%  Weight:      Height:      PainSc:  0-No pain      Isolation Precautions No active isolations  Medications Medications  furosemide (LASIX) injection 40 mg (40 mg Intravenous Given 03/06/23 0938)  albuterol (PROVENTIL) (2.5 MG/3ML) 0.083% nebulizer solution 3 mL (has no administration in time range)  amiodarone  (PACERONE) tablet 100 mg (100 mg Oral Given 03/06/23 0937)  amLODipine (NORVASC) tablet 5 mg (5 mg Oral Given 03/06/23 0935)  aspirin EC tablet 81 mg (81 mg Oral Given 03/06/23 0935)  atorvastatin (LIPITOR) tablet 20 mg (20 mg Oral Given 03/06/23 0936)  escitalopram (LEXAPRO) tablet 10 mg (10 mg Oral Given 03/06/23 0935)  ezetimibe (ZETIA) tablet 10 mg (10 mg Oral Given 03/06/23 0935)  fluticasone furoate-vilanterol (BREO ELLIPTA) 200-25 MCG/ACT 1 puff (has no administration in time range)  memantine (NAMENDA) tablet 5 mg (has no administration in time range)  sodium chloride flush (NS) 0.9 % injection 3 mL (3 mLs Intravenous Given 03/06/23 0944)  sodium chloride flush (NS) 0.9 % injection 3 mL (has no administration in time range)  0.9 %  sodium chloride infusion (has no administration in time range)  acetaminophen (TYLENOL) tablet 650 mg (650 mg Oral Given 03/06/23 0936)  ondansetron (ZOFRAN) injection 4 mg (has no administration in time range)  enoxaparin (LOVENOX) injection 30 mg (30 mg Subcutaneous Given 03/06/23 0938)  insulin aspart (novoLOG) injection 0-9 Units (0 Units Subcutaneous Not Given 03/06/23 1550)  insulin aspart (novoLOG) injection 0-5 Units (has no administration in time range)  perflutren lipid microspheres (DEFINITY) IV suspension (1 mL Intravenous Given 03/06/23 1449)    Mobility walks with person assist     Focused Assessments Pulmonary Assessment Handoff:  Lung sounds:          R Recommendations: See Admitting Provider Note  Report given to:   Additional Notes:

## 2023-03-06 NOTE — ED Triage Notes (Signed)
Pt BIB [facility] from home. Pt started having SOB yesterday that got progressively worse over night. On EMS arrival pt was 87% on room air, pt placed on 2L [place] and O2 increased to 98%. Pt has hx of CHF and is suppose to take diuretic but has recently stopped her his PCP due to kidney disease. Pts wife informed EMS that pt has had some burning and increased urination.

## 2023-03-06 NOTE — ED Provider Notes (Signed)
[facility] Emergency Department Provider Note MRN:  [id number]  Arrival date & time: 03/06/23     Chief Complaint   Shortness of Breath   History of Present Illness   [name] is a 89 y.o. year-old male with a history of CAD, diabetes, CHF presenting to the ED with chief complaint of shortness of breath.  Woke up with dizziness and shortness of breath this morning.  Recently was told to stop his diuretic which she takes for CHF because of his worsening kidney function.  During this time off of the diuretic is noticing increased swelling to the lower extremities, worsening shortness of breath with laying flat.  Endorsing some occasional chest pain today.  Denies fever, no cough, no abdominal pain.  Review of Systems  A thorough review of systems was obtained and all systems are negative except as noted in the HPI and PMH.   Patient's Health History    Past Medical History:  Diagnosis Date   Acute blood loss anemia    Acute upper respiratory infections of unspecified site 11/04/2012   AKI (acute kidney injury) (HCC)    Alzheimer disease (HCC)    Benign prostatic hyperplasia    CAD (coronary artery disease)    s/p cypher DES to pLAD 6/08; normal LVF;  ETT-Myoview 2009: no ischemia    Clavicle fracture 08/10/2019   Closed displaced fracture of phalanx of left thumb, sequela 08/10/2019   Coronary atherosclerosis of native coronary artery 03/08/2008   Dizziness 03/08/2020   Eosinophilia    Essential hypertension    Fracture of multiple ribs with pain 08/10/2019   Hematoma 11/04/2012   History of multiple falls    History of SAH (subarachnoid hemorrhage) 07/14/2019   Hyperlipidemia type IIB / III 03/08/2008   Itching of ear 07/14/2021   Major neurocognitive disorder due to possible Alzheimer's disease, without behavioral disturbance (HCC) 02/29/2020   MI (myocardial infarction) (HCC)    Moderate persistent asthma    [place] traumatic brain  injury with loss of consciousness 07/14/2019   Imaging revealed [facility]   Multiple trauma    Nocturnal leg cramps 11/23/2012   Orthostatic hypotension 03/08/2008   Otalgia of left ear 05/11/2021   Otorrhagia of left ear 03/30/2021   Pain in joint, shoulder region 11/23/2012   Stage 3b chronic kidney disease (HCC)    Thrombocytopenia (HCC)    Trigeminal neuralgia    Type 2 diabetes mellitus (HCC)     Past Surgical History:  Procedure Laterality Date   APPENDECTOMY     CARDIAC CATHETERIZATION  07/30/2006   CORONARY ANGIOPLASTY WITH STENT PLACEMENT   CARDIAC CATHETERIZATION     CATARACT EXTRACTION Right 03/2022   CATARACT EXTRACTION Left 04/2022   EXPLORATORY LAPAROTOMY     age 75   EXPLORATORY LAPAROTOMY     IR THORACENTESIS ASP PLEURAL SPACE W/IMG GUIDE  10/05/2019    Family History  Problem Relation Age of Onset   Stomach cancer Mother    Memory loss Mother    Heart disease Father    Coronary artery disease Father 101   Heart attack Father    Heart disease Brother    Aortic aneurysm Brother    Colon cancer Brother    Aortic aneurysm Brother    Colon cancer Brother    Arthritis Daughter        rheumatoid   Coronary artery disease Brother    Esophageal cancer Neg Hx    Rectal cancer Neg Hx  Social History   Socioeconomic History   Marital status: Married    Spouse name: Clydie Braun   Number of children: 1   Years of education: 20   Highest education level: Patent examiner History   Occupation: retired professor    Comment: History    Occupation: missionary  Tobacco Use   Smoking status: Never   Smokeless tobacco: Never  Advertising account planner   Vaping status: Never Used  Substance and Sexual Activity   Alcohol use: Not Currently   Drug use: Never   Sexual activity: Yes    Partners: Female  Other Topics Concern   Not on file  Social History Narrative   ** Merged History Encounter **       Lives with his wife (second marriage in 2015, first marriage ended when  his wife died alzheimer's disease). Since his remarriage, his adult daughter doesn't speak with him.      Right handed   Social Drivers of Health   Financial Resource Strain: Low Risk  (02/09/2022)   Overall Financial Resource Strain (CARDIA)    Difficulty of Paying Living Expenses: Not hard at all  Food Insecurity: No Food Insecurity (05/09/2022)   Hunger Vital Sign    Worried About Running Out of Food in the Last Year: Never true    Ran Out of Food in the Last Year: Never true  Transportation Needs: No Transportation Needs (05/21/2022)   PRAPARE - Administrator, Civil Service (Medical): No    Lack of Transportation (Non-Medical): No  Physical Activity: Not on file  Stress: Not on file  Social Connections: Not on file  Intimate Partner Violence: Not At Risk (05/05/2022)   Humiliation, Afraid, Rape, and Kick questionnaire    Fear of Current or Ex-Partner: No    Emotionally Abused: No    Physically Abused: No    Sexually Abused: No     Physical Exam   Vitals:   03/06/23 0400 03/06/23 0430  BP: (!) 143/57 (!) 141/56  Pulse: 73 67  Resp: 20 17  Temp:    SpO2: 97% 97%    CONSTITUTIONAL: Well-appearing, NAD NEURO/PSYCH:  Alert and oriented x 3, no focal deficits EYES:  eyes equal and reactive ENT/NECK:  no LAD, no JVD CARDIO: Regular rate, well-perfused, normal S1 and S2 PULM:  CTAB no wheezing or rhonchi GI/GU:  non-distended, non-tender MSK/SPINE:  No gross deformities, pitting edema to bilateral lower extremities SKIN:  no rash, atraumatic   *Additional and/or pertinent findings included in MDM below  Diagnostic and Interventional Summary    EKG Interpretation Date/Time:  Wednesday March 06 2023 03:30:43 EST Ventricular Rate:  79 PR Interval:  165 QRS Duration:  127 QT Interval:  440 QTC Calculation: 505 R Axis:   -51  Text Interpretation: Sinus rhythm Atrial premature complex Nonspecific IVCD with LAD Confirmed by Kennis Carina [phone]) on 03/06/2023  3:45:55 AM       Labs Reviewed  CBC - Abnormal; Notable for the following components:      Result Value   RBC 3.46 (*)    Hemoglobin 10.4 (*)    HCT 31.8 (*)    RDW 16.0 (*)    All other components within normal limits  COMPREHENSIVE METABOLIC PANEL - Abnormal; Notable for the following components:   CO2 20 (*)    Glucose, Bld 115 (*)    BUN 36 (*)    Creatinine, Ser 2.45 (*)    Calcium 8.4 (*)    Albumin  2.9 (*)    GFR, Estimated 25 (*)    All other components within normal limits  BRAIN NATRIURETIC PEPTIDE - Abnormal; Notable for the following components:   B Natriuretic Peptide 857.3 (*)    All other components within normal limits  TROPONIN I (HIGH SENSITIVITY) - Abnormal; Notable for the following components:   Troponin I (High Sensitivity) 63 (*)    All other components within normal limits    DG Chest [facility] 1 View  Final Result      Medications - No data to display   Procedures  /  Critical Care Procedures  ED Course and Medical Decision Making  Initial Impression and Ddx Suspect pulmonary edema in the setting of CHF/CKD.  Doubt PE, overall doubt ACS.  Reassuring vital signs, workup pending.  Patient requiring 2 L nasal cannula to maintain saturations in the 90s, anticipating admission.  Past medical/surgical history that increases complexity of ED encounter:  CHF, CKD  Interpretation of Diagnostics I personally reviewed the Chest Xray and my interpretation is as follows:  pulm edema  Elevated BNP and trop  Patient Reassessment and Ultimate Disposition/Management     Admit to hospitalist.  Patient management required discussion with the following services or consulting groups:  Hospitalist Service  Complexity of Problems Addressed Acute illness or injury that poses threat of life of bodily function  Additional Data Reviewed and Analyzed Further history obtained from: Further history from spouse/family member  Additional Factors Impacting ED  Encounter Risk Consideration of hospitalization  Elmer Sow. Pilar Plate, MD [facility] Emergency Medicine [facility] [email or web address]  Final Clinical Impressions(s) / ED Diagnoses     ICD-10-CM   1. Acute on chronic congestive heart failure, unspecified heart failure type (HCC)  I50.9     2. Hypoxia  R09.02       ED Discharge Orders     None        Discharge Instructions Discussed with and Provided to Patient:   Discharge Instructions   None      Sabas Sous, MD 03/06/23 [phone]

## 2023-03-06 NOTE — Progress Notes (Signed)
Heart Failure Navigator Progress Note  Assessed for Heart & Vascular TOC clinic readiness.  Patient does not meet criteria due to per MD note patient with history of Alzheimer's dementia.   Navigator will sign off at this time.   Rhae Hammock, BSN, Scientist, clinical (histocompatibility and immunogenetics) Only

## 2023-03-07 DIAGNOSIS — I5033 Acute on chronic diastolic (congestive) heart failure: Secondary | ICD-10-CM | POA: Diagnosis not present

## 2023-03-07 LAB — GLUCOSE, CAPILLARY
Glucose-Capillary: 81 mg/dL (ref 70–99)
Glucose-Capillary: 122 mg/dL — ABNORMAL HIGH (ref 70–99)
Glucose-Capillary: 111 mg/dL — ABNORMAL HIGH (ref 70–99)
Glucose-Capillary: 110 mg/dL — ABNORMAL HIGH (ref 70–99)

## 2023-03-07 LAB — RESPIRATORY PANEL BY PCR

## 2023-03-07 LAB — BASIC METABOLIC PANEL
Anion gap: 9 (ref 5–15)
BUN: 34 mg/dL — ABNORMAL HIGH (ref 8–23)
CO2: 24 mmol/L (ref 22–32)
Calcium: 8.3 mg/dL — ABNORMAL LOW (ref 8.9–10.3)
Chloride: 104 mmol/L (ref 98–111)
Creatinine, Ser: 2.69 mg/dL — ABNORMAL HIGH (ref 0.61–1.24)
GFR, Estimated: 22 mL/min — ABNORMAL LOW (ref >60)
Glucose, Bld: 92 mg/dL (ref 70–99)
Potassium: 3.8 mmol/L (ref 3.5–5.1)
Sodium: 137 mmol/L (ref 135–145)

## 2023-03-07 LAB — RESP PANEL BY RT-PCR (RSV, FLU A&B, COVID)  RVPGX2
Influenza A by PCR: NEGATIVE
Influenza B by PCR: NEGATIVE
Resp Syncytial Virus by PCR: NEGATIVE
SARS Coronavirus 2 by RT PCR: NEGATIVE

## 2023-03-07 NOTE — Evaluation (Addendum)
Addendum After receiving clarification from family and Social Worker regarding available post-acute rehab options and family preferences, OT updating post-acute discharge recommendation this day to intensive inpatient skilled rehab services < 3 hours per day to maximize rehab potential. [name] "[name] Eva., OTR/L, [place] [facility] [phone]  Occupational Therapy Evaluation Patient Details Name: [name] MRN: [id number] DOB: 06/08/1933 Today's Date: 03/07/2023   History of Present Illness 89 y.o. male  was brought into the ED 1/29 by wife due to concern of shortness of breath that has been ongoing for about a week.  Patient endorsed having increased shortness of breath with laying flat but not with exertion. Admitted for treatment of edema/CHF exacerbation  PMH:CAD, diabetes, diastolic CHF, hyperlipidemia, dementia, paroxysmal atrial fibrillation, CKD stage IV, anemia of chronic disease   Clinical Impression   At baseline, pt requires Set up for self-feeding and Min to Mod assist with all other ADLs with family reporting increased assistance needed over the past approx. 3 weeks. At baseline, pt performs functional with a Rollator with family reporting 2 falls in past 2 weeks. Pt receives assistance from family for all IADLs. Pt now presents with decreased activity tolerance, impaired B UE fine and gross motor coordination, decreased cognition (suspect novel environment is contributing), and decreased safety and independence with functional tasks. Pt currently demonstrates ability to complete UB ADLs with Set up to Max assist, LB ADLs with Max assist +2, and functional transfers with a RW with Min-Mod assist +2, all with cues for sequencing and safety. Pt participated well in session and has good family support. Pt will benefit from acute skilled OT services to address deficits outlined below and to increase safety and independence with functional tasks. Pt's wife reports pt recently had an  evaluation with [facility] for palliative services and wife is under the impression they have a facility offering short-term rehabilitation services. SW notified to obtain clarification. Pt will require 24/7 supervision and assistance upon acute discharge and will benefit from post-acute short-term rehab services. After completing short-term rehab, pt may be a good candidate for PACE or similar community-based program for ongoing pt and family support.     If plan is discharge home, recommend the following: Two people to help with walking and/or transfers;Assistance with cooking/housework;Direct supervision/assist for medications management;Direct supervision/assist for financial management;Assist for transportation;Help with stairs or ramp for entrance;Supervision due to cognitive status;Two people to help with bathing/dressing/bathroom (Set up for self-feeding)    Functional Status Assessment  Patient has had a recent decline in their functional status and demonstrates the ability to make significant improvements in function in a reasonable and predictable amount of time.  Equipment Recommendations  BSC/3in1;Hospital bed    Recommendations for Other Services       Precautions / Restrictions Precautions Precautions: Fall Precaution Comments: multiple falls in the last 2 weeks Restrictions Weight Bearing Restrictions Per Provider Order: No      Mobility Bed Mobility               General bed mobility comments: sitting up in recliner on entry and at end of session    Transfers Overall transfer level: Needs assistance Equipment used: Rolling walker (2 wheels) Transfers: Sit to/from Stand, Bed to chair/wheelchair/BSC Sit to Stand: Mod assist, Min assist, +2 safety/equipment, +2 physical assistance     Step pivot transfers: Mod assist, Min assist, +2 safety/equipment, +2 physical assistance     General transfer comment: requires increased cuing for sequencing scooting hips  forward and placing  hands on arm rests to power up to standing requiring modA on inital power up from recliner, min Ax2  for power up from recliner to get to [facility]      Balance Overall balance assessment: Needs assistance Sitting-balance support: Feet supported, No upper extremity supported Sitting balance-Leahy Scale: Fair     Standing balance support: During functional activity, Bilateral upper extremity supported, Single extremity supported, Reliant on assistive device for balance Standing balance-Leahy Scale: Poor Standing balance comment: benefits from UE support and outside assist in dynamic balance                           ADL either performed or assessed with clinical judgement   ADL Overall ADL's : Needs assistance/impaired Eating/Feeding: Set up;Sitting   Grooming: Minimal assistance;Sitting;Cueing for sequencing   Upper Body Bathing: Maximal assistance;Cueing for sequencing;Cueing for safety;Sitting   Lower Body Bathing: Maximal assistance;Cueing for safety;Cueing for sequencing;Sit to/from stand;+2 for safety/equipment   Upper Body Dressing : Moderate assistance;Cueing for sequencing   Lower Body Dressing: Maximal assistance;Cueing for safety;Cueing for sequencing;Sit to/from stand;Sitting/lateral leans;+2 for safety/equipment   Toilet Transfer: Minimal assistance;Moderate assistance;+2 for physical assistance;+2 for safety/equipment;BSC/3in1;Rolling walker (2 wheels) (step-pivot)   Toileting- Clothing Manipulation and Hygiene: Maximal assistance;+2 for safety/equipment;Sit to/from stand;Cueing for safety;Cueing for sequencing         General ADL Comments: Decreased activity tolerance. Pt requiring Max cues throughout tasks for sequencing and safety during tasks with family reporting this level of cueing is near baseline.     Vision Baseline Vision/History: 1 Wears glasses Ability to See in Adequate Light: 1 Impaired Patient Visual Report: No change  from baseline Additional Comments: Vision not formally assessed. Pt appears to have decreased depth perception and decreased visual motor integration during functional tasks     Perception         Praxis         Pertinent Vitals/Pain Pain Assessment Pain Assessment: Faces Faces Pain Scale: Hurts little more Pain Location: sacral and cocyxx presure injuries Pain Descriptors / Indicators: Grimacing, Guarding Pain Intervention(s): Other (comment), Limited activity within patient's tolerance, Monitored during session, Repositioned (PT has ordered geomat)     Extremity/Trunk Assessment Upper Extremity Assessment Upper Extremity Assessment: Right hand dominant;RUE deficits/detail;LUE deficits/detail RUE Deficits / Details: decreased shoulder flexion at baseline; gross strength 4/5; decreased gross and fine motor coordination RUE Sensation: WNL RUE Coordination: decreased gross motor;decreased fine motor LUE Deficits / Details: decreased shoulder flexion at baseline; gross strength 4/5; decreased gross and fine motor coordination LUE Sensation: WNL LUE Coordination: decreased fine motor;decreased gross motor   Lower Extremity Assessment Lower Extremity Assessment: Defer to PT evaluation RLE Deficits / Details: knee lacks full extension and foot has neutral dorsiflexion. knee ext 2+/5 LLE Deficits / Details: generalized weakness   Cervical / Trunk Assessment Cervical / Trunk Assessment: Kyphotic   Communication Communication Communication: Difficulty following commands/understanding Following commands: Follows one step commands inconsistently;Follows one step commands with increased time;Follows multi-step commands inconsistently;Follows multi-step commands with increased time Cueing Techniques: Verbal cues;Tactile cues;Visual cues   Cognition Arousal: Alert Behavior During Therapy: WFL for tasks assessed/performed Overall Cognitive Status: History of cognitive impairments - at  baseline                                 General Comments: Pt leasantly confused with increased confusion in hospital per family; suspect due to  novel environment. Oriented x3 with use of calendar and board in room. Pt repeating questions and comments throughout session. Pt frequently impulsive with movement.     General Comments  SpO2 on RA >90%O2 except with functional transfer when it dropped to high 80s and recovered quickly. HR in 80s throughout session. Family friend and daughter present during session.    Exercises     Shoulder Instructions      Home Living Family/patient expects to be discharged to:: Private residence Living Arrangements: Spouse/significant other;Children (daughter) Available Help at Discharge: Family;Available 24 hours/day Type of Home: House Home Access: Stairs to enter [employer] of Steps: 7 Entrance Stairs-Rails: Right;Left (conflicting report regarding the steadiness of hand rails) Home Layout: Multi-level;1/2 bath on main level;Bed/bath upstairs;Other (Comment) (pt sleeping in lift chair, wife sleeps on the couch) Alternate Level Stairs-Number of Steps: 13 Alternate Level Stairs-Rails: Right;Left Bathroom Shower/Tub: Tub/shower unit;Sponge bathes at baseline   [employer]: Handicapped height Bathroom Accessibility: No (leaves Rollator outside the door and uses sink and counter to side step to the toilet)   Home Equipment: Cane - single point;Rollator (4 wheels);Tub bench   Additional Comments: daughter and [name] (a family friend) in room initially provided home set up and PLOF, Wife clarifies over phone      Prior Functioning/Environment Prior Level of Function : Independent/Modified Independent             Mobility Comments: uses Rollator to mobilize on first floor, 2 falls in last 2 weeks ADLs Comments: wife and daughter help with dressing, bathing at sink, grooming, and toileting with pt requiring  increased assistance (Mod to Max assist just prior to admission) over past approx. 3 weeks; pt able to self-feed with Set-up; family provides medication management, meal prep, transportation, and home management tasks        OT Problem List: Decreased activity tolerance;Impaired balance (sitting and/or standing);Decreased coordination;Decreased knowledge of use of DME or AE      OT Treatment/Interventions: Self-care/ADL training;Therapeutic exercise;DME and/or AE instruction;Therapeutic activities;Patient/family education;Balance training;Cognitive remediation/compensation    OT Goals(Current goals can be found in the care plan section) Acute Rehab OT Goals Patient Stated Goal: to return home with family OT Goal Formulation: With family Time For Goal Achievement: 03/21/23 Potential to Achieve Goals: Fair ADL Goals Pt Will Perform Upper Body Bathing: with min assist;sitting (with cues for initation, sequencing, and safety) Pt Will Perform Lower Body Bathing: with min assist;sitting/lateral leans;sit to/from stand (with cues for initation, sequencing, and safety) Pt Will Perform Upper Body Dressing: with contact guard assist;sitting (with cues for initation, sequencing, and safety) Pt Will Perform Lower Body Dressing: with min assist;sitting/lateral leans;sit to/from stand (with cues for initation, sequencing, and safety) Pt Will Transfer to Toilet: with min assist;ambulating;bedside commode (with least restrictive AD; with cues for initation, sequencing, and safety) Pt Will Perform Toileting - Clothing Manipulation and hygiene: with min assist;sit to/from stand;sitting/lateral leans (with cues for initation, sequencing, and safety)  OT Frequency: Min 1X/week    Co-evaluation              AM-PAC OT "6 Clicks" Daily Activity     Outcome Measure Help from another person eating meals?: A Little Help from another person taking care of personal grooming?: A Little Help from another person  toileting, which includes using toliet, bedpan, or urinal?: A Lot Help from another person bathing (including washing, rinsing, drying)?: A Lot Help from another person to put on and taking off regular upper body clothing?:  A Lot Help from another person to put on and taking off regular lower body clothing?: A Lot 6 Click Score: 14   End of Session Equipment Utilized During Treatment: Gait belt;Rolling walker (2 wheels) Nurse Communication: Mobility status  Activity Tolerance: Patient tolerated treatment well Patient left: in chair;with call bell/phone within reach;with family/visitor present  OT Visit Diagnosis: Unsteadiness on feet (R26.81);Other (comment) (decreased activity tolerance)                Time: 1191-4782 OT Time Calculation (min): 55 min Charges:  OT General Charges $OT Visit: 1 Visit OT Evaluation $OT Eval Moderate Complexity: 1 Mod OT Treatments $Self Care/Home Management : 38-52 mins  [name] "[name] Eva., OTR/L, [place] Acute Rehab [phone]  [name] 03/07/2023, 5:54 PM

## 2023-03-07 NOTE — Progress Notes (Addendum)
PROGRESS NOTE    [name]  ZOX:[id number] DOB: Apr 27, 1933 DOA: 03/06/2023 PCP: Donita Brooks, MD   Brief Narrative:  HPI: [name] is a 89 y.o. male with medical history significant of CAD, diabetes, diastolic CHF, hyperlipidemia, dementia, paroxysmal atrial fibrillation, CKD stage IV, anemia of chronic disease was brought into the ED by wife due to concern of shortness of breath that has been ongoing for about a week.  Patient endorsed having increased shortness of breath with laying flat but not with exertion.  He also said that he was having burning urination a week ago but not now but his wife told me that he was still complaining of shortness of breath and patient tends to forget due to dementia.  He did not have any fever, he denies any other complaints at the moment.  Patient's friend or some family member was at the bedside, she called the wife and the wife was on the speaker phone during the whole time.   ED Course: Upon arrival to ED, he was slightly hypertensive but otherwise stable.  Patient was diagnosed to have pulmonary edema/CHF exacerbation based on elevated BNP, hypoxia and chest x-ray findings.  Hospital service were called for admission for further management of CHF exacerbation.  Patient had not received any medications in the ED.  Assessment & Plan:   Principal Problem:   Acute on chronic congestive heart failure (HCC) Active Problems:   Hyperlipidemia type IIB / III   Coronary atherosclerosis of native coronary artery   Type 2 diabetes mellitus (HCC)   Essential hypertension   CAD (coronary artery disease)   Fever  Acute hypoxic respiratory failure secondary to acute on chronic congestive heart failure with preserved ejection fraction/acute pulmonary edema, POA: Patient still requiring 2 L of oxygen.  Intake and output is not charted at all.  Does not have crackles that he had yesterday.  Will continue current Lasix with a strict I's and O's and  daily weight.  Discussed with RN personally today to make sure accurate ins and outs are charted.   Fever: Patient developed fever of 100.7 after admission at 7:18 AM.  His wife and planned of him complaining of dysuria, UA checked was unremarkable, with him being hypoxic and a lot of respiratory infections going around, checking COVID, RSV, flu and respiratory viral panel.   Paroxysmal atrial fibrillation: Currently in sinus rhythm.  Not on any anticoagulation.  Resumed amiodarone.   Dyslipidemia/hyperlipidemia: Continue statins/Zetia.   Essential hypertension: Controlled, continue amlodipine.   COPD: Stable.  Continue Breo Ellipta.   Type 2 diabetes mellitus: Last hemoglobin A1c available in chart is from March 2024 and was 6.2.  He is on Jardiance PTA.  Continue SSI.   Alzheimer's dementia: Resume Namenda.  Diarrhea: Only 1 episode charted but reportedly he has had couple of loose bowel movements.  No abdominal pain or tenderness.  Checking C. difficile.  Generalized weakness: Consulted PT OT.  DVT prophylaxis: enoxaparin (LOVENOX) injection 30 mg Start: 03/06/23 1000   Code Status: Limited: Do not attempt resuscitation (DNR) -DNR-LIMITED -Do Not Intubate/DNI   Family Communication: Wife's best friend present at bedside.  Plan of care discussed with patient, wife's best friend at the bedside and wife over the speaker phone in the room.  Status is: Inpatient Remains inpatient appropriate because: Patient significantly weak and hypoxic.   Estimated body mass index is 26.24 kg/m as calculated from the following:   Height as of this encounter: 5\' 7"  (1.702  m).   Weight as of this encounter: 76 kg.    Nutritional Assessment: Body mass index is 26.24 kg/m.Marland Kitchen Seen by dietician.  I agree with the assessment and plan as outlined below: Nutrition Status:        . Skin Assessment: I have examined the patient's skin and I agree with the wound assessment as performed by the wound  care RN as outlined below:    Consultants:  None  Procedures:  None  Antimicrobials:  Anti-infectives (From admission, onward)    None         Subjective: Seen and examined this morning, he had no complaints, denied any shortness of breath.  Appeared comfortable.  Objective: Vitals:   03/07/23 0800 03/07/23 0900 03/07/23 1000 03/07/23 1100  BP:      Pulse: 82 71 64 64  Resp: 19 (!) 21 20 20   Temp:      TempSrc:      SpO2: 95% 98% 99% 96%  Weight:      Height:       No intake or output data in the 24 hours ending 03/07/23 1405 Filed Weights   03/06/23 0329 03/07/23 0450  Weight: 81.6 kg 76 kg    Examination:  General exam: Appears calm and comfortable  Respiratory system: Clear to auscultation. Respiratory effort normal. Cardiovascular system: S1 & S2 heard, RRR. No JVD, murmurs, rubs, gallops or clicks. No pedal edema. Gastrointestinal system: Abdomen is nondistended, soft and nontender. No organomegaly or masses felt. Normal bowel sounds heard. Central nervous system: Alert and oriented x 1-2. No focal neurological deficits. Extremities: Symmetric 5 x 5 power. Skin: No rashes, lesions or ulcers Psychiatry: Judgement and insight appear poor   Data Reviewed: I have personally reviewed following labs and imaging studies  CBC: Recent Labs  Lab 03/06/23 0330 03/06/23 0846  WBC 8.0 6.0  HGB 10.4* 9.4*  HCT 31.8* 29.7*  MCV 91.9 93.7  PLT 299 267   Basic Metabolic Panel: Recent Labs  Lab 03/06/23 0330 03/06/23 0846 03/07/23 0309  NA 135  --  137  K 3.9  --  3.8  CL 103  --  104  CO2 20*  --  24  GLUCOSE 115*  --  92  BUN 36*  --  34*  CREATININE 2.45* 2.30* 2.69*  CALCIUM 8.4*  --  8.3*   GFR: Estimated Creatinine Clearance: 18.1 mL/min (A) (by C-G formula based on SCr of 2.69 mg/dL (H)). Liver Function Tests: Recent Labs  Lab 03/06/23 0330  AST 40  ALT 29  ALKPHOS 105  BILITOT 1.0  PROT 6.6  ALBUMIN 2.9*   No results for input(s):  "LIPASE", "AMYLASE" in the last 168 hours. No results for input(s): "AMMONIA" in the last 168 hours. Coagulation Profile: No results for input(s): "INR", "PROTIME" in the last 168 hours. Cardiac Enzymes: No results for input(s): "CKTOTAL", "CKMB", "CKMBINDEX", "TROPONINI" in the last 168 hours. BNP (last 3 results) No results for input(s): "PROBNP" in the last 8760 hours. HbA1C: No results for input(s): "HGBA1C" in the last 72 hours. CBG: Recent Labs  Lab 03/06/23 1707 03/06/23 2231 03/07/23 0646 03/07/23 1143  GLUCAP 123* 89 81 122*   Lipid Profile: No results for input(s): "CHOL", "HDL", "LDLCALC", "TRIG", "CHOLHDL", "LDLDIRECT" in the last 72 hours. Thyroid Function Tests: No results for input(s): "TSH", "T4TOTAL", "FREET4", "T3FREE", "THYROIDAB" in the last 72 hours. Anemia Panel: No results for input(s): "VITAMINB12", "FOLATE", "FERRITIN", "TIBC", "IRON", "RETICCTPCT" in the last 72 hours. Sepsis  Labs: No results for input(s): "PROCALCITON", "LATICACIDVEN" in the last 168 hours.  No results found for this or any previous visit (from the past 240 hours).   Radiology Studies: ECHOCARDIOGRAM COMPLETE Result Date: 03/06/2023    ECHOCARDIOGRAM REPORT   Patient Name:   NHAT HEARNE Date of Exam: 03/06/2023 Medical Rec #:  [id number]          Height:       67.0 in Accession #:    [id number]         Weight:       180.0 lb Date of Birth:  17-Jul-1933          BSA:          1.934 m Patient Age:    89 years           BP:           141/64 mmHg Patient Gender: M                  HR:           62 bpm. Exam Location:  Inpatient Procedure: 2D Echo, Color Doppler, Cardiac Doppler and Intracardiac            Opacification Agent Indications:    CHF  History:        Patient has prior history of Echocardiogram examinations, most                 recent 01/10/2022. CAD and Previous Myocardial Infarction,                 Arrythmias:Atrial Fibrillation; Risk Factors:Hypertension and                  Diabetes.  Sonographer:    Webb Laws Referring Phys: [id number] Jazlene Bares IMPRESSIONS  1. Left ventricular ejection fraction, by estimation, is 45 to 50%. The left ventricle has mildly decreased function. The left ventricle has no regional wall motion abnormalities. Left ventricular diastolic parameters are consistent with Grade III diastolic dysfunction (restrictive).  2. Right ventricular systolic function is normal. The right ventricular size is normal.  3. Left atrial size was mildly dilated.  4. The mitral valve is normal in structure. Trivial mitral valve regurgitation. No evidence of mitral stenosis. Moderate mitral annular calcification.  5. The aortic valve is normal in structure. There is mild calcification of the aortic valve. Aortic valve regurgitation is trivial. Aortic valve sclerosis/calcification is present, without any evidence of aortic stenosis. FINDINGS  Left Ventricle: Left ventricular ejection fraction, by estimation, is 45 to 50%. The left ventricle has mildly decreased function. The left ventricle has no regional wall motion abnormalities. Definity contrast agent was given IV to delineate the left ventricular endocardial borders. The left ventricular internal cavity size was normal in size. There is no left ventricular hypertrophy. Left ventricular diastolic parameters are consistent with Grade III diastolic dysfunction (restrictive). Right Ventricle: The right ventricular size is normal. No increase in right ventricular wall thickness. Right ventricular systolic function is normal. Left Atrium: Left atrial size was mildly dilated. Right Atrium: Right atrial size was normal in size. Pericardium: There is no evidence of pericardial effusion. Mitral Valve: The mitral valve is normal in structure. Moderate mitral annular calcification. Trivial mitral valve regurgitation. No evidence of mitral valve stenosis. Tricuspid Valve: The tricuspid valve is normal in structure. Tricuspid valve  regurgitation is trivial. No evidence of tricuspid stenosis. Aortic Valve: The aortic valve is normal in structure. There is  mild calcification of the aortic valve. Aortic valve regurgitation is trivial. Aortic valve sclerosis/calcification is present, without any evidence of aortic stenosis. Aortic valve peak gradient measures 10.0 mmHg. Pulmonic Valve: The pulmonic valve was normal in structure. Pulmonic valve regurgitation is not visualized. No evidence of pulmonic stenosis. Aorta: The aortic root is normal in size and structure. Venous: The inferior vena cava was not well visualized. IAS/Shunts: No atrial level shunt detected by color flow Doppler.  LEFT VENTRICLE PLAX 2D LVIDd:         4.40 cm      Diastology LVIDs:         3.20 cm      LV e' medial:    4.46 cm/s LV PW:         1.40 cm      LV E/e' medial:  22.9 LV IVS:        2.00 cm      LV e' lateral:   5.66 cm/s LVOT diam:     2.20 cm      LV E/e' lateral: 18.0 LV SV:         91 LV SV Index:   47 LVOT Area:     3.80 cm  LV Volumes (MOD) LV vol d, MOD A2C: 151.0 ml LV vol d, MOD A4C: 135.0 ml LV vol s, MOD A2C: 79.8 ml LV vol s, MOD A4C: 72.9 ml LV SV MOD A2C:     71.2 ml LV SV MOD A4C:     135.0 ml LV SV MOD BP:      67.7 ml RIGHT VENTRICLE RV Basal diam:  3.00 cm RV S prime:     13.60 cm/s TAPSE (M-mode): 1.9 cm LEFT ATRIUM             Index        RIGHT ATRIUM           Index LA diam:        3.50 cm 1.81 cm/m   RA Area:     11.40 cm LA Vol (A2C):   53.7 ml 27.77 ml/m  RA Volume:   17.70 ml  9.15 ml/m LA Vol (A4C):   44.9 ml 23.22 ml/m LA Biplane Vol: 51.8 ml 26.79 ml/m  AORTIC VALVE AV Area (Vmax): 2.65 cm AV Vmax:        158.00 cm/s AV Peak Grad:   10.0 mmHg LVOT Vmax:      110.00 cm/s LVOT Vmean:     77.300 cm/s LVOT VTI:       0.239 m  AORTA Ao Root diam: 3.10 cm Ao Asc diam:  3.60 cm MITRAL VALVE MV Area (PHT): 3.65 cm     SHUNTS MV Decel Time: 208 msec     Systemic VTI:  0.24 m MV E velocity: 102.00 cm/s  Systemic Diam: 2.20 cm MV A  velocity: 57.70 cm/s MV E/A ratio:  1.77 Arvilla Meres MD Electronically signed by Arvilla Meres MD Signature Date/Time: 03/06/2023/3:04:15 PM    Final    DG Chest Port 1 View Result Date: 03/06/2023 CLINICAL DATA:  Shortness of breath EXAM: PORTABLE CHEST 1 VIEW COMPARISON:  10/11/2022 FINDINGS: Diffuse interstitial opacity above prior baseline with some patchy densities especially on the right as well. Chronic cardiomegaly. Subpleural opacity in the left lower lung, underestimated relative to prior CT which also shows a chronic, loculated pleural effusion. No pneumothorax. Remote left rib fractures. Artifact from EKG leads. IMPRESSION: 1. Both interstitial and airspace opacity when  compared to prior, pulmonary edema or pneumonia are both possible. 2. Chronic airspace disease and pleural fluid at the left base. Electronically Signed   By: Tiburcio Pea M.D.   On: 03/06/2023 04:20    Scheduled Meds:  amiodarone  100 mg Oral Daily   amLODipine  5 mg Oral Daily   aspirin EC  81 mg Oral Daily   atorvastatin  20 mg Oral Daily   enoxaparin (LOVENOX) injection  30 mg Subcutaneous Daily   escitalopram  10 mg Oral Daily   ezetimibe  10 mg Oral Daily   fluticasone furoate-vilanterol  1 puff Inhalation Daily   furosemide  40 mg Intravenous BID   insulin aspart  0-5 Units Subcutaneous QHS   insulin aspart  0-9 Units Subcutaneous TID WC   memantine  5 mg Oral QHS   sodium chloride flush  3 mL Intravenous Q12H   Continuous Infusions:   LOS: 1 day   Hughie Closs, MD [facility]  03/07/2023, 2:05 PM   *Please note that this is a verbal dictation therefore any spelling or grammatical errors are due to the "Dragon [employer] One" system interpretation.  Please page via Amion and do not message via secure chat for urgent patient care matters. Secure chat can be used for non urgent patient care matters.  How to contact the [facility] Attending or Consulting provider 7A - 7P or covering provider  during after hours 7P -7A, for this patient?  Check the care team in [facility] and look for a) attending/consulting [facility] provider listed and b) the [facility] team listed. Page or secure chat 7A-7P. Log into [email or web address] and use [facility] universal password to access. If you do not have the password, please contact the hospital operator. Locate the [facility] provider you are looking for under [facility] and page to a number that you can be directly reached. If you still have difficulty reaching the provider, please page the [facility] (Director on Call) for the Hospitalists listed on amion for assistance.

## 2023-03-07 NOTE — Evaluation (Addendum)
Addendum: Patient will benefit from continued inpatient follow up therapy, <3 hours/day. Family now agreeable.   Destinie Thornsberry B. Beverely Risen PT, DPT Acute Rehabilitation Services Please use secure chat or  Call Office [phone]    Physical Therapy Evaluation Patient Details Name: [name] MRN: [id number] DOB: September 21, 1933 Today's Date: 03/07/2023  History of Present Illness  89 y.o. male  was brought into the ED 1/29 by wife due to concern of shortness of breath that has been ongoing for about a week.  Patient endorsed having increased shortness of breath with laying flat but not with exertion. Admitted for treatment of edema/CHF exacerbation  PMH:CAD, diabetes, diastolic CHF, hyperlipidemia, dementia, paroxysmal atrial fibrillation, CKD stage IV, anemia of chronic disease  Clinical Impression  PTA pt living with wife and daughter in multilevel home with seven steps to enter. Pt was sleeping in lift chair on first floor to enable upright positioning of his lungs to decrease shortness of breath. In past 2 weeks, pt able to go back up stairs to sleep in flattened bed, but also was experiencing falls. Pt family provided ADLs and iADLs, and pt was ambulating in house with Rollator which he parked outside bathrooms and laterally stepped to toilet using sink and counter for support. Pt is currently limited in safe mobility by increased confusion in novel environment as well as LE weakness R>L, decreased balance, DoE and cognitive decline. Pt requires mod-minAx2 for transfers from recliner and BSC, and modA for safety with ambulation with close chair follow. Pt's wife reports pt just had Evaluation with [facility] for palliative services and is under the impression that they have short term rehabilitation services. PT has reached out to SW to clarify. Pt will need some sort of residential rehabilitation at discharge or hospital bed and 24/7 assist on discharge. PT will continue to follow acutely.        If plan is discharge home, recommend the following: Two people to help with walking and/or transfers;Two people to help with bathing/dressing/bathroom;Assistance with cooking/housework;Direct supervision/assist for medications management;Direct supervision/assist for financial management;Assist for transportation;Help with stairs or ramp for entrance;Supervision due to cognitive status   Can travel by private vehicle    Yes    Equipment Recommendations Hospital bed     Functional Status Assessment Patient has had a recent decline in their functional status and demonstrates the ability to make significant improvements in function in a reasonable and predictable amount of time.     Precautions / Restrictions Precautions Precautions: Fall Precaution Comments: multiple falls in the last 2 weeks Restrictions Weight Bearing Restrictions Per Provider Order: No      Mobility  Bed Mobility               General bed mobility comments: sitting up in recliner on entry    Transfers Overall transfer level: Needs assistance   Transfers: Sit to/from Stand Sit to Stand: Mod assist, Min assist           General transfer comment: requires increased cuing for sequencing scooting hips forward and placing hands on arm rests to power up to standing requiring modA on inital power up from recliner, min Ax2  for power up from recliner to get to [facility]    Ambulation/Gait Ambulation/Gait assistance: Mod assist Gait Distance (Feet): 15 Feet Assistive device: Rolling walker (2 wheels) Gait Pattern/deviations: Step-through pattern, Decreased weight shift to right, Knee flexed in stance - right, Shuffle, Trunk flexed Gait velocity: too fast for conditions Gait velocity interpretation: <  1.8 ft/sec, indicate of risk for recurrent falls   General Gait Details: increased cuing for sequencing and for slowing down with ambulation to doorway, pt with increased trunk flexion and velocity despite  cuing, SpO2 88%O2 with ambulation quickly recovers with coming to seated      Balance Overall balance assessment: Needs assistance Sitting-balance support: Feet supported, No upper extremity supported Sitting balance-Leahy Scale: Fair     Standing balance support: During functional activity, Bilateral upper extremity supported, Single extremity supported, Reliant on assistive device for balance Standing balance-Leahy Scale: Poor Standing balance comment: benefits from outside assist in dynamic balance                             Pertinent Vitals/Pain Pain Assessment Pain Assessment: Faces Faces Pain Scale: Hurts little more Pain Location: sacral and cocyxx presure injuries Pain Intervention(s): Limited activity within patient's tolerance, Repositioned (ordered Geomat for off loading)    Home Living Family/patient expects to be discharged to:: Private residence Living Arrangements: Spouse/significant other;Children (daughter) Available Help at Discharge: Family;Available 24 hours/day Type of Home: House Home Access: Stairs to enter Entrance Stairs-Rails: Right;Left (conflicting report regarding the steadiness of hand rails) Entrance Stairs-Number of Steps: 7 Alternate Level Stairs-Number of Steps: 13 Home Layout: Multi-level;1/2 bath on main level;Bed/bath upstairs;Other (Comment) (pt sleeping in lift chair, wife sleeps on the couch) Home Equipment: Cane - single point;Rollator (4 wheels) Additional Comments: daughter and Algis Downs (a family friend) in room initially provided home set up and PLOF, Wife clarifies over phone    Prior Function Prior Level of Function : Independent/Modified Independent             Mobility Comments: uses Rollator to mobilize on first floor, 2 falls in last 2 weeks ADLs Comments: wife and daughter help with dressing, bathing at sink, grooming, and toileting with Mod to Max assist; pt able to self-feed with Set-up; family provides  medication management, meal prep, transportation, and home management tasks     Extremity/Trunk Assessment   Upper Extremity Assessment Upper Extremity Assessment: Right hand dominant;RUE deficits/detail;LUE deficits/detail RUE Deficits / Details: decreased shoulder flexion at baseline; gross strength 4/5; decreased gross and fine motor coordination RUE Sensation: WNL RUE Coordination: decreased gross motor;decreased fine motor LUE Deficits / Details: decreased shoulder flexion at baseline; gross strength 4/5; decreased gross and fine motor coordination LUE Sensation: WNL LUE Coordination: decreased fine motor;decreased gross motor    Lower Extremity Assessment Lower Extremity Assessment: Defer to PT evaluation RLE Deficits / Details: knee lacks full extension and foot has neutral dorsiflexion. knee ext 2+/5 LLE Deficits / Details: generalized weakness    Cervical / Trunk Assessment Cervical / Trunk Assessment: Kyphotic  Communication   Communication Communication: Difficulty following commands/understanding Following commands: Follows one step commands inconsistently;Follows one step commands with increased time;Follows multi-step commands inconsistently;Follows multi-step commands with increased time Cueing Techniques: Verbal cues;Tactile cues;Visual cues  Cognition Arousal: Alert Behavior During Therapy: WFL for tasks assessed/performed Overall Cognitive Status: History of cognitive impairments - at baseline                                 General Comments: pleasantly confused, oriented x3 with use of calendar and board in room        General Comments General comments (skin integrity, edema, etc.): SpO2 on RA >90%O2 except with short bout of ambulation when it dropped  to high 80s and recovered quickly HR in 80s with ambulation,        Assessment/Plan    PT Assessment Patient needs continued PT services  PT Problem List Decreased strength;Decreased  activity tolerance;Decreased balance;Decreased mobility;Decreased coordination;Decreased cognition;Decreased knowledge of use of DME;Cardiopulmonary status limiting activity;Decreased skin integrity;Pain       PT Treatment Interventions DME instruction;Gait training;Stair training;Functional mobility training;Therapeutic activities;Therapeutic exercise;Balance training;Cognitive remediation;Patient/family education    PT Goals (Current goals can be found in the Care Plan section)  Acute Rehab PT Goals PT Goal Formulation: With patient/family Time For Goal Achievement: 03/21/23 Potential to Achieve Goals: Fair    Frequency Min 1X/week        AM-PAC PT "6 Clicks" Mobility  Outcome Measure Help needed turning from your back to your side while in a flat bed without using bedrails?: A Little Help needed moving from lying on your back to sitting on the side of a flat bed without using bedrails?: A Little Help needed moving to and from a bed to a chair (including a wheelchair)?: A Little Help needed standing up from a chair using your arms (e.g., wheelchair or bedside chair)?: A Little Help needed to walk in hospital room?: A Lot Help needed climbing 3-5 steps with a railing? : Total 6 Click Score: 15    End of Session Equipment Utilized During Treatment: Gait belt Activity Tolerance: Patient tolerated treatment well Patient left: in chair;with call bell/phone within reach Nurse Communication: Mobility status PT Visit Diagnosis: Unsteadiness on feet (R26.81);Other abnormalities of gait and mobility (R26.89);Muscle weakness (generalized) (M62.81);History of falling (Z91.81);Repeated falls (R29.6);Difficulty in walking, not elsewhere classified (R26.2)    Time: 1610-9604 PT Time Calculation (min) (ACUTE ONLY): 63 min   Charges:   PT Evaluation $PT Eval Moderate Complexity: 1 Mod PT Treatments $Gait Training: 8-22 mins $Therapeutic Activity: 8-22 mins PT General Charges $$ ACUTE PT  VISIT: 1 Visit         Churchill Grimsley B. Beverely Risen PT, DPT Acute Rehabilitation Services Please use secure chat or  Call Office [phone]   Elon Alas [facility] 03/07/2023, 4:04 PM

## 2023-03-07 NOTE — Plan of Care (Signed)

## 2023-03-08 DIAGNOSIS — I5033 Acute on chronic diastolic (congestive) heart failure: Secondary | ICD-10-CM | POA: Diagnosis not present

## 2023-03-08 LAB — GLUCOSE, CAPILLARY
Glucose-Capillary: 85 mg/dL (ref 70–99)
Glucose-Capillary: 136 mg/dL — ABNORMAL HIGH (ref 70–99)
Glucose-Capillary: 138 mg/dL — ABNORMAL HIGH (ref 70–99)
Glucose-Capillary: 133 mg/dL — ABNORMAL HIGH (ref 70–99)

## 2023-03-08 LAB — CBC WITH DIFFERENTIAL/PLATELET
Abs Immature Granulocytes: 0.03 10*3/uL (ref 0.00–0.07)
Basophils Absolute: 0 10*3/uL (ref 0.0–0.1)
Basophils Relative: 0 %
Eosinophils Absolute: 0.2 10*3/uL (ref 0.0–0.5)
Eosinophils Relative: 3 %
HCT: 29.5 % — ABNORMAL LOW (ref 39.0–52.0)
Hemoglobin: 9.7 g/dL — ABNORMAL LOW (ref 13.0–17.0)
Immature Granulocytes: 0 %
Lymphocytes Relative: 11 %
Lymphs Abs: 0.8 10*3/uL (ref 0.7–4.0)
MCH: 29.3 pg (ref 26.0–34.0)
MCHC: 32.9 g/dL (ref 30.0–36.0)
MCV: 89.1 fL (ref 80.0–100.0)
Monocytes Absolute: 0.8 10*3/uL (ref 0.1–1.0)
Monocytes Relative: 11 %
Neutro Abs: 5.3 10*3/uL (ref 1.7–7.7)
Neutrophils Relative %: 75 %
Platelets: 279 10*3/uL (ref 150–400)
RBC: 3.31 MIL/uL — ABNORMAL LOW (ref 4.22–5.81)
RDW: 15.5 % (ref 11.5–15.5)
WBC: 7.2 10*3/uL (ref 4.0–10.5)
nRBC: 0 % (ref 0.0–0.2)

## 2023-03-08 LAB — BASIC METABOLIC PANEL
Anion gap: 11 (ref 5–15)
BUN: 40 mg/dL — ABNORMAL HIGH (ref 8–23)
CO2: 25 mmol/L (ref 22–32)
Calcium: 8.3 mg/dL — ABNORMAL LOW (ref 8.9–10.3)
Chloride: 101 mmol/L (ref 98–111)
Creatinine, Ser: 2.49 mg/dL — ABNORMAL HIGH (ref 0.61–1.24)
GFR, Estimated: 24 mL/min — ABNORMAL LOW (ref >60)
Glucose, Bld: 102 mg/dL — ABNORMAL HIGH (ref 70–99)
Potassium: 3.6 mmol/L (ref 3.5–5.1)
Sodium: 137 mmol/L (ref 135–145)

## 2023-03-08 MED ORDER — FUROSEMIDE 10 MG/ML IJ SOLN: 40.0000 mg | Freq: Every day | INTRAMUSCULAR | Status: DC

## 2023-03-08 MED ORDER — FUROSEMIDE 20 MG PO TABS: 20.0000 mg | ORAL_TABLET | Freq: Every day | ORAL | 0 refills | Status: DC

## 2023-03-08 NOTE — Plan of Care (Signed)

## 2023-03-08 NOTE — Care Management Important Message (Signed)
Important Message  Patient Details  Name: [name] MRN: [id number] Date of Birth: 05/11/1933   Important Message Given:  Yes - Medicare IM     Renie Ora 03/08/2023, 10:39 AM

## 2023-03-08 NOTE — Progress Notes (Signed)
Mobility Specialist Progress Note:    03/08/23 0915  Mobility  Activity Ambulated with assistance in room  Level of Assistance Moderate assist, patient does 50-74%  Assistive Device Front wheel walker  Distance Ambulated (ft) 24 ft  Activity Response Tolerated well  Mobility Referral Yes  Mobility visit 1 Mobility  Mobility Specialist Start Time (ACUTE ONLY) 0915  Mobility Specialist Stop Time (ACUTE ONLY) 0925  Mobility Specialist Time Calculation (min) (ACUTE ONLY) 10 min   Pt received in bed, agreeable to ambulate in room for O2 walking test. Before mobility, SpO2 92% on RA. Tolerated well, took one standing rest break, pt c/o SOB, SpO2 89% on RA, recovered with pursed lip breathing, SpO2 94% on RA. Pt agreeable to transfer to chair. Left with all needs met, daughter in room, call bell in reach.    Feliciana Rossetti Mobility Specialist Please contact via Special educational needs teacher or  Rehab office at [phone]

## 2023-03-08 NOTE — TOC Initial Note (Signed)
Transition of Care (TOC) - Initial/Assessment Note  Donn Pierini RN, BSN Transitions of Care Unit 4E- RN Case Manager See Treatment Team for direct phone #   Patient Details  Name: [name] MRN: [id number] Date of Birth: [birth year]  Transition of Care [facility]) CM/SW Contact:    Darrold Span, RN Phone Number: 03/08/2023, 1:17 PM  Clinical Narrative:                 Noted D/C order in, as well as HHPT/OT orders and DME- hospital bed  CM in to speak with pt/family, no family present at bedside- however family friend- [name] (wife's best friend) present- she made TC to wife and placed on speaker phone. Conversation had w/ wife regarding HH and DME needs. Confirmed pt is active with [facility] for PC- wife is asking for clarification with them as to how often they will support with nursing visits- explained that PC is usually a monthly or bi-monthly follow up- but this Clinical research associate will have an [facility] liaison reach out to answer her questions and clarify what support they can offer at the [facility] level.  Choice offered for [facility] services- wife states pt has had [facility] in past and they would like to use [facility] again for therapy needs. Wife expresses concern about not having close nursing follow up, asked about [facility] nursing- which this writer explained could be added to [facility] services- however RN would not be coming daily or every-other day to assess. Wife is voicing that she may need to consider SNF for rehab prior to return home- asked about process of SNF placement. CM explained SNF process including bed search, confirming bed availability as well as need for insurance approval. Wife states she would like to hold return home w/ [facility] and start process for SNF.  Wife voiced that she would like to look at [facility] as well as other SNFs that are in [facility] network- Wife is aware that pt stable for discharge at this time and d/c order in place.   Msg sent to attending regarding change in d/c plan to SNF,  CSW also updated that pt will need SNF bed search   Cm will hold on [facility] and DME needs at this time.   Expected Discharge Plan: Skilled Nursing Facility Barriers to Discharge: SNF Pending bed offer, Insurance Authorization   Patient Goals and CMS Choice Patient states their goals for this hospitalization and ongoing recovery are:: return home after rehab CMS Medicare.gov Compare Post Acute Care list provided to:: Patient Represenative (must comment) Choice offered to / list presented to : Spouse      Expected Discharge Plan and Services In-house Referral: Clinical Social Work Discharge Planning Services: CM Consult Post Acute Care Choice: Durable Medical Equipment, Home Health, Skilled Nursing Facility Living arrangements for the past 2 months: Single Family Home Expected Discharge Date: 03/08/23               DME Arranged: Hospital bed         HH Arranged: RN, Disease Management, PT, OT HH Agency: [facility]        Prior Living Arrangements/Services Living arrangements for the past 2 months: Single Family Home Lives with:: Spouse Patient language and need for interpreter reviewed:: Yes Do you feel safe going back to the place where you live?: Yes      Need for Family Participation in Patient Care: Yes (Comment) Care giver support system in place?: Yes (comment) Current home services: DME Criminal  Activity/Legal Involvement Pertinent to Current Situation/Hospitalization: No - Comment as needed  Activities of Daily Living      Permission Sought/Granted Permission sought to share information with : Facility Industrial/product designer granted to share information with : Yes, Verbal Permission Granted     Permission granted to share info w AGENCY: SNF/HH/DME        Emotional Assessment Appearance:: Appears stated age Attitude/Demeanor/Rapport: Inconsistent Affect (typically observed): Pleasant Orientation: : Oriented to Self Alcohol / Substance  Use: Not Applicable Psych Involvement: No (comment)  Admission diagnosis:  Hypoxia [R09.02] Acute on chronic congestive heart failure (HCC) [I50.9] Acute on chronic congestive heart failure, unspecified heart failure type (HCC) [I50.9] Patient Active Problem List   Diagnosis Date Noted   Acute on chronic congestive heart failure (HCC) 03/06/2023   Pressure sore 01/17/2023   Hallux valgus, acquired 08/23/2022   Fever 05/09/2022   Chest pain 05/05/2022   CKD (chronic kidney disease) stage 3, GFR 30-59 ml/min (HCC) 05/05/2022   Atrial fibrillation with RVR (HCC) 04/27/2022   Itching of ear 07/14/2021   Otalgia of left ear 05/11/2021   Otorrhagia of left ear 03/30/2021   Dizziness 03/08/2020   Major neurocognitive disorder due to possible Alzheimer's disease, without behavioral disturbance 02/29/2020   CAD (coronary artery disease)    MI (myocardial infarction)    Trigeminal neuralgia    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 AT [address] [facility] [address] [place] [address] Phone: [phone] Fax: [phone]     Social Drivers of Health (SDOH) Social History: SDOH Screenings   Food Insecurity: No Food Insecurity (03/06/2023)  Housing: Unknown (03/06/2023)  Transportation Needs: No Transportation Needs (03/06/2023)  Utilities: Not At Risk  (03/06/2023)  Alcohol Screen: Low Risk  (04/07/2018)  Depression (PHQ2-9): Medium Risk (01/24/2023)  Financial Resource Strain: Low Risk  (02/09/2022)  Social Connections: Unknown (03/06/2023)  Tobacco Use: Low Risk  (01/24/2023)   SDOH Interventions:     Readmission Risk Interventions     No data to display

## 2023-03-08 NOTE — Progress Notes (Signed)
Physical Therapy Treatment Patient Details Name: [name] MRN: [id number] DOB: 1933/07/11 Today's Date: 03/08/2023   History of Present Illness 89 y.o. male  was brought into the ED 1/29 by wife due to concern of shortness of breath that has been ongoing for about a week.  Patient endorsed having increased shortness of breath with laying flat but not with exertion. Admitted for treatment of edema/CHF exacerbation  PMH:CAD, diabetes, diastolic CHF, hyperlipidemia, dementia, paroxysmal atrial fibrillation, CKD stage IV, anemia of chronic disease    PT Comments  Pt seen for PT tx with pt agreeable, family present in room. Pt pleasantly confused throughout session, requiring extra time, multimodal cuing to follow commands during session. Pt requires mod assist to exit bed, min assist for STS. Pt ambulates increased distances on this date x 2 trials with RW & min assist with pt veering to L & requiring assistance to maneuver/steer around obstacles of various sizes. Pt would benefit from ongoing skilled PT treatment to address strengthening, balance, & safety with LRAD.    If plan is discharge home, recommend the following: A little help with walking and/or transfers;A little help with bathing/dressing/bathroom;Assistance with cooking/housework;Direct supervision/assist for financial management;Supervision due to cognitive status;Assist for transportation;Help with stairs or ramp for entrance;Direct supervision/assist for medications management   Can travel by private vehicle     Yes  Equipment Recommendations  Rolling walker (2 wheels);Hospital bed    Recommendations for Other Services       Precautions / Restrictions Precautions Precautions: Fall Precaution Comments: multiple falls in the last 2 weeks Restrictions Weight Bearing Restrictions Per Provider Order: No     Mobility  Bed Mobility Overal bed mobility: Needs Assistance Bed Mobility: Supine to Sit     Supine to sit:  Mod assist, Used rails, HOB elevated     General bed mobility comments: assistance to upright trunk & scoot to sitting EOB    Transfers Overall transfer level: Needs assistance Equipment used: Rolling walker (2 wheels) Transfers: Sit to/from Stand Sit to Stand: Min assist           General transfer comment: STS from EOB & recliner, cuing re: hand placement    Ambulation/Gait Ambulation/Gait assistance: Min assist Gait Distance (Feet): 110 Feet (+ 110 ft) Assistive device: Rolling walker (2 wheels) Gait Pattern/deviations: Shuffle, Trunk flexed, Decreased dorsiflexion - right, Decreased dorsiflexion - left, Decreased step length - left, Decreased step length - right, Decreased stride length Gait velocity: increased stepping speed, decreased safety awareness     General Gait Details: cuing/assistance to steer/manuever RW around obstacles in room & hallway, pt veers to L vs straight path when walking   Stairs             Wheelchair Mobility     Tilt Bed    Modified Rankin (Stroke Patients Only)       Balance Overall balance assessment: Needs assistance Sitting-balance support: Feet supported, No upper extremity supported Sitting balance-Leahy Scale: Fair Sitting balance - Comments: supervision static sitting   Standing balance support: During functional activity, Bilateral upper extremity supported, Reliant on assistive device for balance Standing balance-Leahy Scale: Poor                              Cognition Arousal: Alert Behavior During Therapy: WFL for tasks assessed/performed Overall Cognitive Status: History of cognitive impairments - at baseline  General Comments: Pt pleasantly confused throughout session, requires multimodal cuing throughout session, assistance to [facility] RW around obstacles in room, cuing/assistance to avoid large obstacles in hallway.        Exercises       General Comments General comments (skin integrity, edema, etc.): pt on room air during session, SpO2 >/= 90%      Pertinent Vitals/Pain Pain Assessment Pain Assessment: No/denies pain    Home Living                          Prior Function            PT Goals (current goals can now be found in the care plan section) Acute Rehab PT Goals PT Goal Formulation: With patient/family Time For Goal Achievement: 03/21/23 Potential to Achieve Goals: Fair Progress towards PT goals: Progressing toward goals    Frequency    Min 1X/week      PT Plan      Co-evaluation              AM-PAC PT "6 Clicks" Mobility   Outcome Measure  Help needed turning from your back to your side while in a flat bed without using bedrails?: None Help needed moving from lying on your back to sitting on the side of a flat bed without using bedrails?: A Lot Help needed moving to and from a bed to a chair (including a wheelchair)?: A Little Help needed standing up from a chair using your arms (e.g., wheelchair or bedside chair)?: A Little Help needed to walk in hospital room?: A Little Help needed climbing 3-5 steps with a railing? : Total 6 Click Score: 16    End of Session   Activity Tolerance: Patient tolerated treatment well Patient left: in chair;with chair alarm set;with call bell/phone within reach;with family/visitor present Nurse Communication: Mobility status PT Visit Diagnosis: Unsteadiness on feet (R26.81);Muscle weakness (generalized) (M62.81)     Time: 1610-9604 PT Time Calculation (min) (ACUTE ONLY): 23 min  Charges:    $Therapeutic Activity: 23-37 mins PT General Charges $$ ACUTE PT VISIT: 1 Visit                     Aleda Grana, PT, DPT 03/08/23, 2:46 PM   Sandi Mariscal 03/08/2023, 2:44 PM

## 2023-03-08 NOTE — Progress Notes (Signed)
Nurse requested Mobility Specialist to perform oxygen saturation test with pt which includes removing pt from oxygen both at rest and while ambulating.  Below are the results from that testing.     Patient Saturations on Room Air at Rest = spO2 92%  Patient Saturations on Room Air while Ambulating = sp02 89% .  Rested and performed pursed lip breathing for 1 minute with sp02 at 94%.  At end of testing pt left in room on 0  Liters of oxygen.  Reported results to nurse.    Feliciana Rossetti Mobility Specialist Please contact via Special educational needs teacher or  Rehab office at [phone]

## 2023-03-08 NOTE — Progress Notes (Signed)
PROGRESS NOTE    NAVY BELAY  OZH:[id number] DOB: 06/14/1933 DOA: 03/06/2023 PCP: Donita Brooks, MD   Brief Narrative:  HPI: [name] is a 89 y.o. male with medical history significant of CAD, diabetes, diastolic CHF, hyperlipidemia, dementia, paroxysmal atrial fibrillation, CKD stage IV, anemia of chronic disease was brought into the ED by wife due to concern of shortness of breath that has been ongoing for about a week.  Patient endorsed having increased shortness of breath with laying flat but not with exertion.  He also said that he was having burning urination a week ago but not now but his wife told me that he was still complaining of shortness of breath and patient tends to forget due to dementia.  He did not have any fever, he denies any other complaints at the moment.  Patient's friend or some family member was at the bedside, she called the wife and the wife was on the speaker phone during the whole time.   ED Course: Upon arrival to ED, he was slightly hypertensive but otherwise stable.  Patient was diagnosed to have pulmonary edema/CHF exacerbation based on elevated BNP, hypoxia and chest x-ray findings.  Hospital service were called for admission for further management of CHF exacerbation.  Patient had not received any medications in the ED.  Assessment & Plan:   Principal Problem:   Acute on chronic congestive heart failure (HCC) Active Problems:   Hyperlipidemia type IIB / III   Coronary atherosclerosis of native coronary artery   Type 2 diabetes mellitus (HCC)   Essential hypertension   CAD (coronary artery disease)   Fever  Acute hypoxic respiratory failure secondary to acute on chronic congestive heart failure with preserved ejection fraction/acute pulmonary edema, POA: Patient was requiring 2 L of oxygen, started on IV Lasix 40 mg twice daily, has had good diuresis, weaned to room air, walking with mobility today, briefly dipped to 89%.  Oxygen  saturation came up to over 90% without requiring oxygen.  Does not have any crackles today.  Medically stable.  He has already received morning dose of IV Lasix.  Will reduce frequency to once daily now.  Continue rest of the management.   Fever: Patient developed fever of 100.7 after admission at 7:18 AM.  His wife and planned of him complaining of dysuria, UA was unremarkable, he was also ruled out of respiratory viral infections.  He is without symptoms and afebrile.  Paroxysmal atrial fibrillation: Currently in sinus rhythm.  Not on any anticoagulation.  Resumed amiodarone.   Dyslipidemia/hyperlipidemia: Continue statins/Zetia.   Essential hypertension: Controlled, continue amlodipine.   COPD: Stable.  Continue Breo Ellipta.   Type 2 diabetes mellitus: Last hemoglobin A1c available in chart is from March 2024 and was 6.2.  He is on Jardiance PTA.  Continue SSI.   Alzheimer's dementia: Resume Namenda.  Diarrhea: Only 1 episode charted, C. difficile was ordered, patient has not had any bowel movement in 24 hours, highly doubt C. difficile infection.  Right ankle pain: Ankle looks normal and equal to the left ankle.  Also nontender.  Do not suspect anything serious.  Could be arthritis pain.  He walked with mobility team and there were no concerns raised.  Generalized weakness: Seen by PT OT.  PT recommended home health with 24-hour care.  Discussed with the wife this morning, she was totally against him going to SNF and wanted him to come home with home health.  Discharge was completed including the summary,  however afterwards, wife called TOC and requested search for SNF.  DVT prophylaxis: enoxaparin (LOVENOX) injection 30 mg Start: 03/06/23 1000   Code Status: Limited: Do not attempt resuscitation (DNR) -DNR-LIMITED -Do Not Intubate/DNI   Family Communication: Wife's best friend present at bedside.  Plan of care discussed with patient, wife's best friend at the bedside and wife over the  speaker phone in the room.  Status is: Inpatient Remains inpatient appropriate because: Medically stable today, wife is now requesting SNF.  TOC on board.   Estimated body mass index is 25.14 kg/m as calculated from the following:   Height as of this encounter: 5\' 7"  (1.702 m).   Weight as of this encounter: 72.8 kg.    Nutritional Assessment: Body mass index is 25.14 kg/m.Marland Kitchen Seen by dietician.  I agree with the assessment and plan as outlined below: Nutrition Status:        . Skin Assessment: I have examined the patient's skin and I agree with the wound assessment as performed by the wound care RN as outlined below:    Consultants:  None  Procedures:  None  Antimicrobials:  Anti-infectives (From admission, onward)    None         Subjective: Patient seen and examined, friend at the bedside.  Patient has no complaints other than right ankle pain.  Objective: Vitals:   03/08/23 0637 03/08/23 0730 03/08/23 0845 03/08/23 1113  BP:  128/62 108/64 (!) 144/59  Pulse:  72 70 70  Resp: 20 16 16 18   Temp:  98.4 F (36.9 C)  97.8 F (36.6 C)  TempSrc:  Oral  Oral  SpO2:  96% 98% 100%  Weight: 72.8 kg     Height:        Intake/Output Summary (Last 24 hours) at 03/08/2023 1241 Last data filed at 03/08/2023 1200 Gross per 24 hour  Intake 240 ml  Output 2400 ml  Net -2160 ml   Filed Weights   03/06/23 0329 03/07/23 0450 03/08/23 0637  Weight: 81.6 kg 76 kg 72.8 kg    Examination:  General exam: Appears calm and comfortable  Respiratory system: Clear to auscultation. Respiratory effort normal. Cardiovascular system: S1 & S2 heard, RRR. No JVD, murmurs, rubs, gallops or clicks. No pedal edema. Gastrointestinal system: Abdomen is nondistended, soft and nontender. No organomegaly or masses felt. Normal bowel sounds heard. Central nervous system: Alert and orientedx2. No focal neurological deficits. Extremities: Symmetric 5 x 5 power. Skin: No rashes, lesions  or ulcers.  Psychiatry: Judgement and insight appear poor  Data Reviewed: I have personally reviewed following labs and imaging studies  CBC: Recent Labs  Lab 03/06/23 0330 03/06/23 0846 03/08/23 0328  WBC 8.0 6.0 7.2  NEUTROABS  --   --  5.3  HGB 10.4* 9.4* 9.7*  HCT 31.8* 29.7* 29.5*  MCV 91.9 93.7 89.1  PLT 299 267 279   Basic Metabolic Panel: Recent Labs  Lab 03/06/23 0330 03/06/23 0846 03/07/23 0309 03/08/23 0328  NA 135  --  137 137  K 3.9  --  3.8 3.6  CL 103  --  104 101  CO2 20*  --  24 25  GLUCOSE 115*  --  92 102*  BUN 36*  --  34* 40*  CREATININE 2.45* 2.30* 2.69* 2.49*  CALCIUM 8.4*  --  8.3* 8.3*   GFR: Estimated Creatinine Clearance: 19.5 mL/min (A) (by C-G formula based on SCr of 2.49 mg/dL (H)). Liver Function Tests: Recent Labs  Lab 03/06/23  0330  AST 40  ALT 29  ALKPHOS 105  BILITOT 1.0  PROT 6.6  ALBUMIN 2.9*   No results for input(s): "LIPASE", "AMYLASE" in the last 168 hours. No results for input(s): "AMMONIA" in the last 168 hours. Coagulation Profile: No results for input(s): "INR", "PROTIME" in the last 168 hours. Cardiac Enzymes: No results for input(s): "CKTOTAL", "CKMB", "CKMBINDEX", "TROPONINI" in the last 168 hours. BNP (last 3 results) No results for input(s): "PROBNP" in the last 8760 hours. HbA1C: No results for input(s): "HGBA1C" in the last 72 hours. CBG: Recent Labs  Lab 03/07/23 1143 03/07/23 1645 03/07/23 2235 03/08/23 0644 03/08/23 1202  GLUCAP 122* 111* 110* 85 136*   Lipid Profile: No results for input(s): "CHOL", "HDL", "LDLCALC", "TRIG", "CHOLHDL", "LDLDIRECT" in the last 72 hours. Thyroid Function Tests: No results for input(s): "TSH", "T4TOTAL", "FREET4", "T3FREE", "THYROIDAB" in the last 72 hours. Anemia Panel: No results for input(s): "VITAMINB12", "FOLATE", "FERRITIN", "TIBC", "IRON", "RETICCTPCT" in the last 72 hours. Sepsis Labs: No results for input(s): "PROCALCITON", "LATICACIDVEN" in the  last 168 hours.  Recent Results (from the past 240 hours)  Respiratory (~20 pathogens) panel by PCR     Status: None   Collection Time: 03/07/23 12:19 PM   Specimen: Nasopharyngeal Swab; Respiratory  Result Value Ref Range Status   Adenovirus NOT DETECTED NOT DETECTED Final   Coronavirus 229E NOT DETECTED NOT DETECTED Final    Comment: (NOTE) The Coronavirus on the Respiratory Panel, DOES NOT test for the novel  Coronavirus (2019 nCoV)    Coronavirus HKU1 NOT DETECTED NOT DETECTED Final   Coronavirus NL63 NOT DETECTED NOT DETECTED Final   Coronavirus OC43 NOT DETECTED NOT DETECTED Final   Metapneumovirus NOT DETECTED NOT DETECTED Final   Rhinovirus / Enterovirus NOT DETECTED NOT DETECTED Final   Influenza A NOT DETECTED NOT DETECTED Final   Influenza B NOT DETECTED NOT DETECTED Final   Parainfluenza Virus 1 NOT DETECTED NOT DETECTED Final   Parainfluenza Virus 2 NOT DETECTED NOT DETECTED Final   Parainfluenza Virus 3 NOT DETECTED NOT DETECTED Final   Parainfluenza Virus 4 NOT DETECTED NOT DETECTED Final   Respiratory Syncytial Virus NOT DETECTED NOT DETECTED Final   Bordetella pertussis NOT DETECTED NOT DETECTED Final   Bordetella Parapertussis NOT DETECTED NOT DETECTED Final   Chlamydophila pneumoniae NOT DETECTED NOT DETECTED Final   Mycoplasma pneumoniae NOT DETECTED NOT DETECTED Final    Comment: Performed at [facility], [address] [place], [place] [address]  Resp panel by RT-PCR (RSV, Flu A&B, Covid) Anterior Nasal Swab     Status: None   Collection Time: 03/07/23 12:19 PM   Specimen: Anterior Nasal Swab  Result Value Ref Range Status   SARS Coronavirus 2 by RT PCR NEGATIVE NEGATIVE Final   Influenza A by PCR NEGATIVE NEGATIVE Final   Influenza B by PCR NEGATIVE NEGATIVE Final    Comment: (NOTE) The Xpert Xpress SARS-CoV-2/FLU/RSV plus assay is intended as an aid in the diagnosis of influenza from Nasopharyngeal swab specimens and should not be used as a sole  basis for treatment. Nasal washings and aspirates are unacceptable for Xpert Xpress SARS-CoV-2/FLU/RSV testing.  Fact Sheet for Patients: BloggerCourse.com  Fact Sheet for Healthcare Providers: SeriousBroker.it  This test is not yet approved or cleared by the [place] FDA and has been authorized for detection and/or diagnosis of SARS-CoV-2 by FDA under an Emergency Use Authorization (EUA). This EUA will remain in effect (meaning this test can be used)  for the duration of the COVID-19 declaration under Section 564(b)(1) of the Act, 21 U.S.C. section 360bbb-3(b)(1), unless the authorization is terminated or revoked.     Resp Syncytial Virus by PCR NEGATIVE NEGATIVE Final    Comment: (NOTE) Fact Sheet for Patients: BloggerCourse.com  Fact Sheet for Healthcare Providers: SeriousBroker.it  This test is not yet approved or cleared by the [place] FDA and has been authorized for detection and/or diagnosis of SARS-CoV-2 by FDA under an Emergency Use Authorization (EUA). This EUA will remain in effect (meaning this test can be used) for the duration of the COVID-19 declaration under Section 564(b)(1) of the Act, 21 U.S.C. section 360bbb-3(b)(1), unless the authorization is terminated or revoked.  Performed at [facility], [address] [place], [place] [address]      Radiology Studies: ECHOCARDIOGRAM COMPLETE Result Date: 03/06/2023    ECHOCARDIOGRAM REPORT   Patient Name:   [name] Date of Exam: 03/06/2023 Medical Rec #:  [id number]          Height:       67.0 in Accession #:    [id number]         Weight:       180.0 lb Date of Birth:  03/24/1933          BSA:          1.934 m Patient Age:    89 years           BP:           141/64 mmHg Patient Gender: M                  HR:           62 bpm. Exam Location:  Inpatient Procedure: 2D Echo, Color Doppler,  Cardiac Doppler and Intracardiac            Opacification Agent Indications:    CHF  History:        Patient has prior history of Echocardiogram examinations, most                 recent 01/10/2022. CAD and Previous Myocardial Infarction,                 Arrythmias:Atrial Fibrillation; Risk Factors:Hypertension and                 Diabetes.  Sonographer:    Webb Laws Referring Phys: [id number] Lambert Jeanty IMPRESSIONS  1. Left ventricular ejection fraction, by estimation, is 45 to 50%. The left ventricle has mildly decreased function. The left ventricle has no regional wall motion abnormalities. Left ventricular diastolic parameters are consistent with Grade III diastolic dysfunction (restrictive).  2. Right ventricular systolic function is normal. The right ventricular size is normal.  3. Left atrial size was mildly dilated.  4. The mitral valve is normal in structure. Trivial mitral valve regurgitation. No evidence of mitral stenosis. Moderate mitral annular calcification.  5. The aortic valve is normal in structure. There is mild calcification of the aortic valve. Aortic valve regurgitation is trivial. Aortic valve sclerosis/calcification is present, without any evidence of aortic stenosis. FINDINGS  Left Ventricle: Left ventricular ejection fraction, by estimation, is 45 to 50%. The left ventricle has mildly decreased function. The left ventricle has no regional wall motion abnormalities. Definity contrast agent was given IV to delineate the left ventricular endocardial borders. The left ventricular internal cavity size was normal in size. There is no left ventricular hypertrophy. Left  ventricular diastolic parameters are consistent with Grade III diastolic dysfunction (restrictive). Right Ventricle: The right ventricular size is normal. No increase in right ventricular wall thickness. Right ventricular systolic function is normal. Left Atrium: Left atrial size was mildly dilated. Right Atrium: Right atrial  size was normal in size. Pericardium: There is no evidence of pericardial effusion. Mitral Valve: The mitral valve is normal in structure. Moderate mitral annular calcification. Trivial mitral valve regurgitation. No evidence of mitral valve stenosis. Tricuspid Valve: The tricuspid valve is normal in structure. Tricuspid valve regurgitation is trivial. No evidence of tricuspid stenosis. Aortic Valve: The aortic valve is normal in structure. There is mild calcification of the aortic valve. Aortic valve regurgitation is trivial. Aortic valve sclerosis/calcification is present, without any evidence of aortic stenosis. Aortic valve peak gradient measures 10.0 mmHg. Pulmonic Valve: The pulmonic valve was normal in structure. Pulmonic valve regurgitation is not visualized. No evidence of pulmonic stenosis. Aorta: The aortic root is normal in size and structure. Venous: The inferior vena cava was not well visualized. IAS/Shunts: No atrial level shunt detected by color flow Doppler.  LEFT VENTRICLE PLAX 2D LVIDd:         4.40 cm      Diastology LVIDs:         3.20 cm      LV e' medial:    4.46 cm/s LV PW:         1.40 cm      LV E/e' medial:  22.9 LV IVS:        2.00 cm      LV e' lateral:   5.66 cm/s LVOT diam:     2.20 cm      LV E/e' lateral: 18.0 LV SV:         91 LV SV Index:   47 LVOT Area:     3.80 cm  LV Volumes (MOD) LV vol d, MOD A2C: 151.0 ml LV vol d, MOD A4C: 135.0 ml LV vol s, MOD A2C: 79.8 ml LV vol s, MOD A4C: 72.9 ml LV SV MOD A2C:     71.2 ml LV SV MOD A4C:     135.0 ml LV SV MOD BP:      67.7 ml RIGHT VENTRICLE RV Basal diam:  3.00 cm RV S prime:     13.60 cm/s TAPSE (M-mode): 1.9 cm LEFT ATRIUM             Index        RIGHT ATRIUM           Index LA diam:        3.50 cm 1.81 cm/m   RA Area:     11.40 cm LA Vol (A2C):   53.7 ml 27.77 ml/m  RA Volume:   17.70 ml  9.15 ml/m LA Vol (A4C):   44.9 ml 23.22 ml/m LA Biplane Vol: 51.8 ml 26.79 ml/m  AORTIC VALVE AV Area (Vmax): 2.65 cm AV Vmax:         158.00 cm/s AV Peak Grad:   10.0 mmHg LVOT Vmax:      110.00 cm/s LVOT Vmean:     77.300 cm/s LVOT VTI:       0.239 m  AORTA Ao Root diam: 3.10 cm Ao Asc diam:  3.60 cm MITRAL VALVE MV Area (PHT): 3.65 cm     SHUNTS MV Decel Time: 208 msec     Systemic VTI:  0.24 m MV E velocity: 102.00 cm/s  Systemic Diam:  2.20 cm MV A velocity: 57.70 cm/s MV E/A ratio:  1.77 Arvilla Meres MD Electronically signed by Arvilla Meres MD Signature Date/Time: 03/06/2023/3:04:15 PM    Final     Scheduled Meds:  amiodarone  100 mg Oral Daily   amLODipine  5 mg Oral Daily   aspirin EC  81 mg Oral Daily   atorvastatin  20 mg Oral Daily   enoxaparin (LOVENOX) injection  30 mg Subcutaneous Daily   escitalopram  10 mg Oral Daily   ezetimibe  10 mg Oral Daily   fluticasone furoate-vilanterol  1 puff Inhalation Daily   furosemide  40 mg Intravenous BID   insulin aspart  0-5 Units Subcutaneous QHS   insulin aspart  0-9 Units Subcutaneous TID WC   memantine  5 mg Oral QHS   sodium chloride flush  3 mL Intravenous Q12H   Continuous Infusions:   LOS: 2 days   Hughie Closs, MD [facility]  03/08/2023, 12:41 PM   *Please note that this is a verbal dictation therefore any spelling or grammatical errors are due to the "Dragon Medical One" system interpretation.  Please page via Amion and do not message via secure chat for urgent patient care matters. Secure chat can be used for non urgent patient care matters.  How to contact the [facility] Attending or Consulting provider 7A - 7P or covering provider during after hours 7P -7A, for this patient?  Check the care team in [facility] and look for a) attending/consulting [facility] provider listed and b) the [facility] team listed. Page or secure chat 7A-7P. Log into [email or web address] and use [place]'s universal password to access. If you do not have the password, please contact the hospital operator. Locate the [facility] provider you are looking for under [facility] and page to a number  that you can be directly reached. If you still have difficulty reaching the provider, please page the [facility] (Director on Call) for the Hospitalists listed on amion for assistance.

## 2023-03-08 NOTE — Discharge Summary (Signed)
 Physician Discharge Summary  CAESAR [name] FMW:[id number] DOB: 05/25/1933 DOA: 03/06/2023  PCP: [name], MD  Admit date: 03/06/2023 Discharge date: 03/08/2023 30 Day Unplanned Readmission Risk Score    Flowsheet Row ED to Hosp-Admission (Current) from 03/06/2023 in [facility] 4E CV SURGICAL PROGRESSIVE CARE  30 Day Unplanned Readmission Risk Score (%) 22.99 Filed at 03/08/2023 0801       This score is the patient's risk of an unplanned readmission within 30 days of being discharged (0 -100%). The score is based on dignosis, age, lab data, medications, orders, and past utilization.   Low:  0-14.9   Medium: 15-21.9   High: 22-29.9   Extreme: 30 and above          Admitted From: Home Disposition: Home  Recommendations for Outpatient Follow-up:  Follow up with PCP in 1-2 weeks Please obtain BMP/CBC in one week Follow-up with your primary cardiologist in 1 to 2 weeks Please follow up with your PCP on the following pending results: Unresulted Labs (From admission, onward)     Start     Ordered   03/13/23 0500  Creatinine, serum  (enoxaparin  (LOVENOX )    CrCl < 30 ml/min)  Once,   R       Comments: while on enoxaparin  therapy.    03/06/23 0846   03/07/23 0818  C Difficile Quick Screen w PCR reflex  (C Difficile quick screen w PCR reflex panel )  Once, for 24 hours,   TIMED       References:    CDiff Information Tool   03/07/23 0817              Home Health: Yes Equipment/Devices: Hospice bed  Discharge Condition: Stable CODE STATUS: DNR Diet recommendation: Close DM  Subjective: Seen and examined, friend at the bedside.  Patient is feeling well and has no complaints at all other than right ankle pain.  I examined the right ankle, he does not appear to have any open sores, tenderness, warmth or redness.  He may have ankle sprain.  He was able to walk with mobility team as well.  Brief/Interim Summary:  [name] is a 89 y.o. male with medical history  significant of CAD, diabetes, diastolic CHF, hyperlipidemia, dementia, paroxysmal atrial fibrillation, CKD stage IV, anemia of chronic disease was brought into the ED by wife due to concern of shortness of breath that has been ongoing for about a week.  Patient endorsed having increased shortness of breath with laying flat but not with exertion.  He also said that he was having burning urination a week ago but not now but his wife told me that he was still complaining of burning urination and patient tends to forget due to dementia.  He did not have any fever, he denied any other complaints at the moment.    Upon arrival to ED, he was slightly hypertensive but otherwise stable.  Patient was diagnosed to have pulmonary edema/CHF exacerbation based on elevated BNP, hypoxia and chest x-ray findings.  Patient also had low-grade fever of 100.7 on the morning of admission.  He was admitted to hospital service for further management.   Acute hypoxic respiratory failure secondary to acute on chronic congestive heart failure with preserved ejection fraction/acute pulmonary edema, POA: Per wife, patient was not on any Lasix  per cardiology recommendations.  He was started on IV Lasix .  He had significant output, weaned down to room air and currently feeling much better with no crackles and  not requiring any oxygen.  He is stable for discharge.  Wife was concerned about him going home and reaccumulating the fluid.  She further told me that patient's cardiologist Dr. Wonda had told her that he cannot be on any diuretics due to CKD.  I discussed with Dr. Wonda, he was okay with discharging the patient on 20 mg Lasix  p.o. daily.  Patient to follow-up with cardiology in 1 to 2 weeks.    Fever: Patient developed fever of 100.7 after admission at 7:18 AM on 03/06/2023.  All infectious workup was negative including respiratory viral panel, UA, COVID, RSV and flu.  Patient has no symptoms.  He has remained afebrile for 48 hours.   I suspect that the temperature reading was likely error.   Paroxysmal atrial fibrillation: Currently in sinus rhythm.  Not on any anticoagulation.  Resumed amiodarone .   Dyslipidemia/hyperlipidemia: Continue statins/Zetia .   Essential hypertension: Controlled, continue amlodipine .   COPD: Stable.  Continue Breo Ellipta .   Type 2 diabetes mellitus: Last hemoglobin A1c available in chart is from March 2024 and was 6.2.  He is on Jardiance  PTA.    Alzheimer's dementia: Resume Namenda .   Diarrhea: Only 1 episode charted but reportedly he has had couple of loose bowel movements.  No abdominal pain or tenderness.  Ordered C. difficile but patient has not had any bowel movement for more than 24 hours.   Generalized weakness: Seen by PT OT, initially they recommended home with home health, however wife wanted him to go to SNF, then PT recommended their recommendations to SNF.  Patient had been stable for last 3 to 4 days pending SNF placement.  The wife was given bed offers, she did not like those facilities and eventually decided to take patient home with home health care instead.  Discharge plan was discussed with patient and/or family member and they verbalized understanding and agreed with it.  Discharge Diagnoses:  Principal Problem:   Acute on chronic congestive heart failure (HCC) Active Problems:   Hyperlipidemia type IIB / III   Coronary atherosclerosis of native coronary artery   Type 2 diabetes mellitus (HCC)   Essential hypertension   CAD (coronary artery disease)   Fever    Discharge Instructions   Allergies as of 03/08/2023       Reactions   Gabapentin  Other (See Comments)   Visual changes and confusion- I went blind while I was driving        Medication List     TAKE these medications    acetaminophen  500 MG tablet Commonly known as: TYLENOL  Take 1,000 mg by mouth as needed for headache or mild pain (pain score 1-3).   albuterol  108 (90 Base) MCG/ACT  inhaler Commonly known as: ProAir  HFA INHALE 2 PUFFS INTO THE LUNGS EVERY 6 HOURS AS NEEDED FOR WHEEZING OR SHORTNESS OF BREATH What changed:  how much to take how to take this when to take this reasons to take this   amiodarone  200 MG tablet Commonly known as: PACERONE  Take 0.5 tablets (100 mg total) by mouth daily.   amLODipine  5 MG tablet Commonly known as: NORVASC  Take 1 tablet (5 mg total) by mouth daily. What changed: when to take this   aspirin  EC 81 MG tablet Take 1 tablet (81 mg total) by mouth daily. Swallow whole.   atorvastatin  20 MG tablet Commonly known as: LIPITOR TAKE 1 TABLET BY MOUTH EVERY DAY   empagliflozin  10 MG Tabs tablet Commonly known as: Jardiance  Take 1 tablet (  10 mg total) by mouth daily before breakfast.   escitalopram  10 MG tablet Commonly known as: LEXAPRO  Take 1 tablet (10 mg total) by mouth daily.   ezetimibe  10 MG tablet Commonly known as: ZETIA  TAKE 1 TABLET BY MOUTH EVERY DAY   fluticasone  furoate-vilanterol 200-25 MCG/ACT Aepb Commonly known as: Breo Ellipta  Inhale 1 puff into the lungs daily.   furosemide  20 MG tablet Commonly known as: Lasix  Take 1 tablet (20 mg total) by mouth daily. What changed:  medication strength how much to take when to take this   memantine  5 MG tablet Commonly known as: NAMENDA  TAKE 1 TABLET BY MOUTH EVERYDAY AT BEDTIME   multivitamin with minerals Tabs tablet Take 1 tablet by mouth daily.               Durable Medical Equipment  (From admission, onward)           Start     Ordered   03/08/23 1020  For home use only DME Hospital bed  Once       Question Answer Comment  Length of Need Lifetime   The above medical condition requires: Patient requires the ability to reposition frequently   Bed type Semi-electric      03/08/23 1020            Follow-up Information     [name], MD Follow up in 1 week(s).   Specialty: Family Medicine Contact information: [name] [place] [address] [phone]                Allergies  Allergen Reactions   Gabapentin  Other (See Comments)    Visual changes and confusion- I went blind while I was driving    Consultations: None   Procedures/Studies: ECHOCARDIOGRAM COMPLETE Result Date: 03/06/2023    ECHOCARDIOGRAM REPORT   Patient Name:   EDAHI KROENING Date of Exam: 03/06/2023 Medical Rec #:  [id number]          Height:       67.0 in Accession #:    [id number]         Weight:       180.0 lb Date of Birth:  [birth year]          BSA:          1.934 m Patient Age:    89 years           BP:           141/64 mmHg Patient Gender: M                  HR:           62 bpm. Exam Location:  Inpatient Procedure: 2D Echo, Color Doppler, Cardiac Doppler and Intracardiac            Opacification Agent Indications:    CHF  History:        Patient has prior history of Echocardiogram examinations, most                 recent 01/10/2022. CAD and Previous Myocardial Infarction,                 Arrythmias:Atrial Fibrillation; Risk Factors:Hypertension and                 Diabetes.  Sonographer:    Lanell Maduro Referring Phys: [id number] Graydon Fofana IMPRESSIONS  1. Left ventricular ejection fraction, by estimation, is 45 to 50%.  The left ventricle has mildly decreased function. The left ventricle has no regional wall motion abnormalities. Left ventricular diastolic parameters are consistent with Grade III diastolic dysfunction (restrictive).  2. Right ventricular systolic function is normal. The right ventricular size is normal.  3. Left atrial size was mildly dilated.  4. The mitral valve is normal in structure. Trivial mitral valve regurgitation. No evidence of mitral stenosis. Moderate mitral annular calcification.  5. The aortic valve is normal in structure. There is mild calcification of the aortic valve. Aortic valve regurgitation is trivial. Aortic valve sclerosis/calcification is present, without any evidence  of aortic stenosis. FINDINGS  Left Ventricle: Left ventricular ejection fraction, by estimation, is 45 to 50%. The left ventricle has mildly decreased function. The left ventricle has no regional wall motion abnormalities. Definity  contrast agent was given IV to delineate the left ventricular endocardial borders. The left ventricular internal cavity size was normal in size. There is no left ventricular hypertrophy. Left ventricular diastolic parameters are consistent with Grade III diastolic dysfunction (restrictive). Right Ventricle: The right ventricular size is normal. No increase in right ventricular wall thickness. Right ventricular systolic function is normal. Left Atrium: Left atrial size was mildly dilated. Right Atrium: Right atrial size was normal in size. Pericardium: There is no evidence of pericardial effusion. Mitral Valve: The mitral valve is normal in structure. Moderate mitral annular calcification. Trivial mitral valve regurgitation. No evidence of mitral valve stenosis. Tricuspid Valve: The tricuspid valve is normal in structure. Tricuspid valve regurgitation is trivial. No evidence of tricuspid stenosis. Aortic Valve: The aortic valve is normal in structure. There is mild calcification of the aortic valve. Aortic valve regurgitation is trivial. Aortic valve sclerosis/calcification is present, without any evidence of aortic stenosis. Aortic valve peak gradient measures 10.0 mmHg. Pulmonic Valve: The pulmonic valve was normal in structure. Pulmonic valve regurgitation is not visualized. No evidence of pulmonic stenosis. Aorta: The aortic root is normal in size and structure. Venous: The inferior vena cava was not well visualized. IAS/Shunts: No atrial level shunt detected by color flow Doppler.  LEFT VENTRICLE PLAX 2D LVIDd:         4.40 cm      Diastology LVIDs:         3.20 cm      LV e' medial:    4.46 cm/s LV PW:         1.40 cm      LV E/e' medial:  22.9 LV IVS:        2.00 cm      LV e'  lateral:   5.66 cm/s LVOT diam:     2.20 cm      LV E/e' lateral: 18.0 LV SV:         91 LV SV Index:   47 LVOT Area:     3.80 cm  LV Volumes (MOD) LV vol d, MOD A2C: 151.0 ml LV vol d, MOD A4C: 135.0 ml LV vol s, MOD A2C: 79.8 ml LV vol s, MOD A4C: 72.9 ml LV SV MOD A2C:     71.2 ml LV SV MOD A4C:     135.0 ml LV SV MOD BP:      67.7 ml RIGHT VENTRICLE RV Basal diam:  3.00 cm RV S prime:     13.60 cm/s TAPSE (M-mode): 1.9 cm LEFT ATRIUM             Index        RIGHT ATRIUM  Index LA diam:        3.50 cm 1.81 cm/m   RA Area:     11.40 cm LA Vol (A2C):   53.7 ml 27.77 ml/m  RA Volume:   17.70 ml  9.15 ml/m LA Vol (A4C):   44.9 ml 23.22 ml/m LA Biplane Vol: 51.8 ml 26.79 ml/m  AORTIC VALVE AV Area (Vmax): 2.65 cm AV Vmax:        158.00 cm/s AV Peak Grad:   10.0 mmHg LVOT Vmax:      110.00 cm/s LVOT Vmean:     77.300 cm/s LVOT VTI:       0.239 m  AORTA Ao Root diam: 3.10 cm Ao Asc diam:  3.60 cm MITRAL VALVE MV Area (PHT): 3.65 cm     SHUNTS MV Decel Time: 208 msec     Systemic VTI:  0.24 m MV E velocity: 102.00 cm/s  Systemic Diam: 2.20 cm MV A velocity: 57.70 cm/s MV E/A ratio:  1.77 Toribio Fuel MD Electronically signed by Toribio Fuel MD Signature Date/Time: 03/06/2023/3:04:15 PM    Final    DG Chest Port 1 View Result Date: 03/06/2023 CLINICAL DATA:  Shortness of breath EXAM: PORTABLE CHEST 1 VIEW COMPARISON:  10/11/2022 FINDINGS: Diffuse interstitial opacity above prior baseline with some patchy densities especially on the right as well. Chronic cardiomegaly. Subpleural opacity in the left lower lung, underestimated relative to prior CT which also shows a chronic, loculated pleural effusion. No pneumothorax. Remote left rib fractures. Artifact from EKG leads. IMPRESSION: 1. Both interstitial and airspace opacity when compared to prior, pulmonary edema or pneumonia are both possible. 2. Chronic airspace disease and pleural fluid at the left base. Electronically Signed   By: Dorn Roulette M.D.   On: 03/06/2023 04:20     Discharge Exam: Vitals:   03/08/23 0730 03/08/23 0845  BP: 128/62 108/64  Pulse: 72 70  Resp: 16 16  Temp: 98.4 F (36.9 C)   SpO2: 96% 98%   Vitals:   03/08/23 0410 03/08/23 0637 03/08/23 0730 03/08/23 0845  BP: (!) 150/56  128/62 108/64  Pulse: 75  72 70  Resp: (!) 21 20 16 16   Temp: 98.1 F (36.7 C)  98.4 F (36.9 C)   TempSrc: Oral  Oral   SpO2: 94%  96% 98%  Weight:  72.8 kg    Height:        General: Pt is alert, awake, not in acute distress Cardiovascular: RRR, S1/S2 +, no rubs, no gallops Respiratory: CTA bilaterally, no wheezing, no rhonchi Abdominal: Soft, NT, ND, bowel sounds + Extremities: no edema, no cyanosis    The results of significant diagnostics from this hospitalization (including imaging, microbiology, ancillary and laboratory) are listed below for reference.     Microbiology: Recent Results (from the past 240 hours)  Respiratory (~20 pathogens) panel by PCR     Status: None   Collection Time: 03/07/23 12:19 PM   Specimen: Nasopharyngeal Swab; Respiratory  Result Value Ref Range Status   Adenovirus NOT DETECTED NOT DETECTED Final   Coronavirus 229E NOT DETECTED NOT DETECTED Final    Comment: (NOTE) The Coronavirus on the Respiratory Panel, DOES NOT test for the novel  Coronavirus (2019 nCoV)    Coronavirus HKU1 NOT DETECTED NOT DETECTED Final   Coronavirus NL63 NOT DETECTED NOT DETECTED Final   Coronavirus OC43 NOT DETECTED NOT DETECTED Final   Metapneumovirus NOT DETECTED NOT DETECTED Final   Rhinovirus / Enterovirus NOT DETECTED NOT DETECTED  Final   Influenza A NOT DETECTED NOT DETECTED Final   Influenza B NOT DETECTED NOT DETECTED Final   Parainfluenza Virus 1 NOT DETECTED NOT DETECTED Final   Parainfluenza Virus 2 NOT DETECTED NOT DETECTED Final   Parainfluenza Virus 3 NOT DETECTED NOT DETECTED Final   Parainfluenza Virus 4 NOT DETECTED NOT DETECTED Final   Respiratory Syncytial Virus NOT  DETECTED NOT DETECTED Final   Bordetella pertussis NOT DETECTED NOT DETECTED Final   Bordetella Parapertussis NOT DETECTED NOT DETECTED Final   Chlamydophila pneumoniae NOT DETECTED NOT DETECTED Final   Mycoplasma pneumoniae NOT DETECTED NOT DETECTED Final    Comment: Performed at [facility], [address], [place], [place] [address]  Resp panel by RT-PCR (RSV, Flu A&B, Covid) Anterior Nasal Swab     Status: None   Collection Time: 03/07/23 12:19 PM   Specimen: Anterior Nasal Swab  Result Value Ref Range Status   SARS Coronavirus 2 by RT PCR NEGATIVE NEGATIVE Final   Influenza A by PCR NEGATIVE NEGATIVE Final   Influenza B by PCR NEGATIVE NEGATIVE Final    Comment: (NOTE) The Xpert Xpress SARS-CoV-2/FLU/RSV plus assay is intended as an aid in the diagnosis of influenza from Nasopharyngeal swab specimens and should not be used as a sole basis for treatment. Nasal washings and aspirates are unacceptable for Xpert Xpress SARS-CoV-2/FLU/RSV testing.  Fact Sheet for Patients: bloggercourse.com  Fact Sheet for Healthcare Providers: seriousbroker.it  This test is not yet approved or cleared by the [place]  FDA and has been authorized for detection and/or diagnosis of SARS-CoV-2 by FDA under an Emergency Use Authorization (EUA). This EUA will remain in effect (meaning this test can be used) for the duration of the COVID-19 declaration under Section 564(b)(1) of the Act, 21 U.S.C. section 360bbb-3(b)(1), unless the authorization is terminated or revoked.     Resp Syncytial Virus by PCR NEGATIVE NEGATIVE Final    Comment: (NOTE) Fact Sheet for Patients: bloggercourse.com  Fact Sheet for Healthcare Providers: seriousbroker.it  This test is not yet approved or cleared by the [place]  FDA and has been authorized for detection and/or diagnosis of SARS-CoV-2 by FDA under  an Emergency Use Authorization (EUA). This EUA will remain in effect (meaning this test can be used) for the duration of the COVID-19 declaration under Section 564(b)(1) of the Act, 21 U.S.C. section 360bbb-3(b)(1), unless the authorization is terminated or revoked.  Performed at [facility], [address], [place], [place] [address]      Labs: BNP (last 3 results) Recent Labs    04/27/22 1824 05/16/22 0934 03/06/23 0330  BNP 316.9* 261.6* 857.3*   Basic Metabolic Panel: Recent Labs  Lab 03/06/23 0330 03/06/23 0846 03/07/23 0309 03/08/23 0328  NA 135  --  137 137  K 3.9  --  3.8 3.6  CL 103  --  104 101  CO2 20*  --  24 25  GLUCOSE 115*  --  92 102*  BUN 36*  --  34* 40*  CREATININE 2.45* 2.30* 2.69* 2.49*  CALCIUM  8.4*  --  8.3* 8.3*   Liver Function Tests: Recent Labs  Lab 03/06/23 0330  AST 40  ALT 29  ALKPHOS 105  BILITOT 1.0  PROT 6.6  ALBUMIN  2.9*   No results for input(s): LIPASE, AMYLASE in the last 168 hours. No results for input(s): AMMONIA in the last 168 hours. CBC: Recent Labs  Lab 03/06/23 0330 03/06/23 0846 03/08/23 0328  WBC 8.0  6.0 7.2  NEUTROABS  --   --  5.3  HGB 10.4* 9.4* 9.7*  HCT 31.8* 29.7* 29.5*  MCV 91.9 93.7 89.1  PLT 299 267 279   Cardiac Enzymes: No results for input(s): CKTOTAL, CKMB, CKMBINDEX, TROPONINI in the last 168 hours. BNP: Invalid input(s): POCBNP CBG: Recent Labs  Lab 03/07/23 0646 03/07/23 1143 03/07/23 1645 03/07/23 2235 03/08/23 0644  GLUCAP 81 122* 111* 110* 85   D-Dimer No results for input(s): DDIMER in the last 72 hours. Hgb A1c No results for input(s): HGBA1C in the last 72 hours. Lipid Profile No results for input(s): CHOL, HDL, LDLCALC, TRIG, CHOLHDL, LDLDIRECT in the last 72 hours. Thyroid  function studies No results for input(s): TSH, T4TOTAL, T3FREE, THYROIDAB in the last 72 hours.  Invalid input(s): FREET3 Anemia work up No  results for input(s): VITAMINB12, FOLATE, FERRITIN, TIBC, IRON, RETICCTPCT in the last 72 hours. Urinalysis    Component Value Date/Time   COLORURINE YELLOW 03/06/2023 0714   APPEARANCEUR CLEAR 03/06/2023 0714   LABSPEC 1.010 03/06/2023 0714   PHURINE 6.0 03/06/2023 0714   GLUCOSEU >=500 (A) 03/06/2023 0714   HGBUR NEGATIVE 03/06/2023 0714   BILIRUBINUR NEGATIVE 03/06/2023 0714   BILIRUBINUR neg 09/28/2012 0832   KETONESUR NEGATIVE 03/06/2023 0714   PROTEINUR 100 (A) 03/06/2023 0714   UROBILINOGEN 0.2 09/28/2012 0832   NITRITE NEGATIVE 03/06/2023 0714   LEUKOCYTESUR NEGATIVE 03/06/2023 0714   Sepsis Labs Recent Labs  Lab 03/06/23 0330 03/06/23 0846 03/08/23 0328  WBC 8.0 6.0 7.2   Microbiology Recent Results (from the past 240 hours)  Respiratory (~20 pathogens) panel by PCR     Status: None   Collection Time: 03/07/23 12:19 PM   Specimen: Nasopharyngeal Swab; Respiratory  Result Value Ref Range Status   Adenovirus NOT DETECTED NOT DETECTED Final   Coronavirus 229E NOT DETECTED NOT DETECTED Final    Comment: (NOTE) The Coronavirus on the Respiratory Panel, DOES NOT test for the novel  Coronavirus (2019 nCoV)    Coronavirus HKU1 NOT DETECTED NOT DETECTED Final   Coronavirus NL63 NOT DETECTED NOT DETECTED Final   Coronavirus OC43 NOT DETECTED NOT DETECTED Final   Metapneumovirus NOT DETECTED NOT DETECTED Final   Rhinovirus / Enterovirus NOT DETECTED NOT DETECTED Final   Influenza A NOT DETECTED NOT DETECTED Final   Influenza B NOT DETECTED NOT DETECTED Final   Parainfluenza Virus 1 NOT DETECTED NOT DETECTED Final   Parainfluenza Virus 2 NOT DETECTED NOT DETECTED Final   Parainfluenza Virus 3 NOT DETECTED NOT DETECTED Final   Parainfluenza Virus 4 NOT DETECTED NOT DETECTED Final   Respiratory Syncytial Virus NOT DETECTED NOT DETECTED Final   Bordetella pertussis NOT DETECTED NOT DETECTED Final   Bordetella Parapertussis NOT DETECTED NOT DETECTED Final    Chlamydophila pneumoniae NOT DETECTED NOT DETECTED Final   Mycoplasma pneumoniae NOT DETECTED NOT DETECTED Final    Comment: Performed at [facility], [address], [place], [place] [address]  Resp panel by RT-PCR (RSV, Flu A&B, Covid) Anterior Nasal Swab     Status: None   Collection Time: 03/07/23 12:19 PM   Specimen: Anterior Nasal Swab  Result Value Ref Range Status   SARS Coronavirus 2 by RT PCR NEGATIVE NEGATIVE Final   Influenza A by PCR NEGATIVE NEGATIVE Final   Influenza B by PCR NEGATIVE NEGATIVE Final    Comment: (NOTE) The Xpert Xpress SARS-CoV-2/FLU/RSV plus assay is intended as an aid in the diagnosis of influenza from Nasopharyngeal swab specimens  and should not be used as a sole basis for treatment. Nasal washings and aspirates are unacceptable for Xpert Xpress SARS-CoV-2/FLU/RSV testing.  Fact Sheet for Patients: bloggercourse.com  Fact Sheet for Healthcare Providers: seriousbroker.it  This test is not yet approved or cleared by the [place]  FDA and has been authorized for detection and/or diagnosis of SARS-CoV-2 by FDA under an Emergency Use Authorization (EUA). This EUA will remain in effect (meaning this test can be used) for the duration of the COVID-19 declaration under Section 564(b)(1) of the Act, 21 U.S.C. section 360bbb-3(b)(1), unless the authorization is terminated or revoked.     Resp Syncytial Virus by PCR NEGATIVE NEGATIVE Final    Comment: (NOTE) Fact Sheet for Patients: bloggercourse.com  Fact Sheet for Healthcare Providers: seriousbroker.it  This test is not yet approved or cleared by the [place]  FDA and has been authorized for detection and/or diagnosis of SARS-CoV-2 by FDA under an Emergency Use Authorization (EUA). This EUA will remain in effect (meaning this test can be used) for the duration of the COVID-19 declaration  under Section 564(b)(1) of the Act, 21 U.S.C. section 360bbb-3(b)(1), unless the authorization is terminated or revoked.  Performed at [facility], [address], [place], [place] [address]     FURTHER DISCHARGE INSTRUCTIONS:   Get Medicines reviewed and adjusted: Please take all your medications with you for your next visit with your Primary MD   Laboratory/radiological data: Please request your Primary MD to go over all hospital tests and procedure/radiological results at the follow up, please ask your Primary MD to get all Hospital records sent to his/her office.   In some cases, they will be blood work, cultures and biopsy results pending at the time of your discharge. Please request that your primary care M.D. goes through all the records of your hospital data and follows up on these results.   Also Note the following: If you experience worsening of your admission symptoms, develop shortness of breath, life threatening emergency, suicidal or homicidal thoughts you must seek medical attention immediately by calling 911 or calling your MD immediately  if symptoms less severe.   You must read complete instructions/literature along with all the possible adverse reactions/side effects for all the Medicines you take and that have been prescribed to you. Take any new Medicines after you have completely understood and accpet all the possible adverse reactions/side effects.    Do not drive when taking Pain medications or sleeping medications (Benzodaizepines)   Do not take more than prescribed Pain, Sleep and Anxiety Medications. It is not advisable to combine anxiety,sleep and pain medications without talking with your primary care practitioner   Special Instructions: If you have smoked or chewed Tobacco  in the last 2 yrs please stop smoking, stop any regular Alcohol  and or any Recreational drug use.   Wear Seat belts while driving.   Please note: You were cared for by a  hospitalist during your hospital stay. Once you are discharged, your primary care physician will handle any further medical issues. Please note that NO REFILLS for any discharge medications will be authorized once you are discharged, as it is imperative that you return to your primary care physician (or establish a relationship with a primary care physician if you do not have one) for your post hospital discharge needs so that they can reassess your need for medications and monitor your lab values  Time coordinating discharge: Over 30 minutes  SIGNED:   Fredia Skeeter,  MD  [facility] 03/08/2023, 11:04 AM *Please note that this is a verbal dictation therefore any spelling or grammatical errors are due to the Dragon Medical One system interpretation. If 7PM-7AM, please contact night-coverage [email or web address]

## 2023-03-09 DIAGNOSIS — I5033 Acute on chronic diastolic (congestive) heart failure: Secondary | ICD-10-CM | POA: Diagnosis not present

## 2023-03-09 LAB — BASIC METABOLIC PANEL
Anion gap: 11 (ref 5–15)
BUN: 39 mg/dL — ABNORMAL HIGH (ref 8–23)
CO2: 24 mmol/L (ref 22–32)
Calcium: 8.2 mg/dL — ABNORMAL LOW (ref 8.9–10.3)
Chloride: 98 mmol/L (ref 98–111)
Creatinine, Ser: 2.5 mg/dL — ABNORMAL HIGH (ref 0.61–1.24)
GFR, Estimated: 24 mL/min — ABNORMAL LOW (ref >60)
Glucose, Bld: 170 mg/dL — ABNORMAL HIGH (ref 70–99)
Potassium: 3.9 mmol/L (ref 3.5–5.1)
Sodium: 133 mmol/L — ABNORMAL LOW (ref 135–145)

## 2023-03-09 LAB — GLUCOSE, CAPILLARY
Glucose-Capillary: 115 mg/dL — ABNORMAL HIGH (ref 70–99)
Glucose-Capillary: 147 mg/dL — ABNORMAL HIGH (ref 70–99)
Glucose-Capillary: 97 mg/dL (ref 70–99)
Glucose-Capillary: 156 mg/dL — ABNORMAL HIGH (ref 70–99)

## 2023-03-09 MED ORDER — FUROSEMIDE 40 MG PO TABS
40.0000 mg | ORAL_TABLET | Freq: Every day | ORAL | Status: AC
  Administered 2023-03-09 – 2023-03-10 (×2): 40 mg via ORAL
  Filled 2023-03-09 (×2): qty 1

## 2023-03-09 NOTE — TOC Progression Note (Signed)
Transition of Care [facility]) - Progression Note    Patient Details  Name: [name] MRN: [id number] Date of Birth: Jun 12, 1933  Transition of Care [facility]) CM/SW Contact  Nicanor Bake Phone Number: [phone] 03/09/2023, 10:38 AM  Clinical Narrative:  10:38 AM- CSW called and left a VM with pts spouse to inquire about SNF and start process.   Pts wife called CSW back to discuss SNF process. Pts wife stated that her and pts daughter is currently in the hospital at [facility] and she is overwhelmed with everything going on at this time. CSW explained SNF process. CSW explained that SNF options will be faxed out and we can discuss accepted list. Once that is done insurance auth will be started. CSW explained that someone will be in contact once offers come in.   Passr started- need additional documents attached.  FL2 completed.  SNFs faxed out.     TOC will continue following.      Expected Discharge Plan: Skilled Nursing Facility Barriers to Discharge: SNF Pending bed offer, Insurance Authorization  Expected Discharge Plan and Services In-house Referral: Clinical Social Work Discharge Planning Services: CM Consult Post Acute Care Choice: Durable Medical Equipment, Home Health, Skilled Nursing Facility Living arrangements for the past 2 months: Single Family Home Expected Discharge Date: 03/08/23               DME Arranged: Hospital bed         HH Arranged: RN, Disease Management, PT, OT HH Agency: [facility]         Social Determinants of Health (SDOH) Interventions SDOH Screenings   Food Insecurity: No Food Insecurity (03/06/2023)  Housing: Unknown (03/06/2023)  Transportation Needs: No Transportation Needs (03/06/2023)  Utilities: Not At Risk (03/06/2023)  Alcohol Screen: Low Risk  (04/07/2018)  Depression (PHQ2-9): Medium Risk (01/24/2023)  Financial Resource Strain: Low Risk  (02/09/2022)  Social Connections: Unknown (03/06/2023)  Tobacco Use: Low  Risk  (01/24/2023)    Readmission Risk Interventions     No data to display

## 2023-03-09 NOTE — NC FL2 (Signed)
[place] MEDICAID FL2 LEVEL OF CARE FORM     IDENTIFICATION  Patient Name: [name] Birthdate: 1933/11/20 Sex: male Admission Date (Current Location): 03/06/2023  [facility] and [place] Number:  Producer, television/film/video and Address:  [facility], [address] [place], [place] [address]      Provider Number: [id number]  Attending Physician Name and Address:  Hughie Closs, MD  Relative Name and Phone Number:  Adalid, Beckmann   [phone]    Current Level of Care: Hospital Recommended Level of Care: Skilled Nursing Facility Prior Approval Number:    Date Approved/Denied:   PASRR Number: PASRR Review Addl Info  Discharge Plan: SNF    Current Diagnoses: Patient Active Problem List   Diagnosis Date Noted   Acute on chronic congestive heart failure (HCC) 03/06/2023   Pressure sore 01/17/2023   Hallux valgus, acquired 08/23/2022   Fever 05/09/2022   Chest pain 05/05/2022   CKD (chronic kidney disease) stage 3, GFR 30-59 ml/min (HCC) 05/05/2022   Atrial fibrillation with RVR (HCC) 04/27/2022   Itching of ear 07/14/2021   Otalgia of left ear 05/11/2021   Otorrhagia of left ear 03/30/2021   Dizziness 03/08/2020   Major neurocognitive disorder due to possible Alzheimer's disease, without behavioral disturbance 02/29/2020   CAD (coronary artery disease)    MI (myocardial infarction)    Trigeminal neuralgia    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    Multiple trauma    Benign prostatic hyperplasia    Essential hypertension    Stage 3b chronic kidney disease (HCC)    Thrombocytopenia    History of multiple falls 07/14/2019   History of SAH (subarachnoid hemorrhage) 07/14/2019   Type 2 diabetes mellitus (HCC)    Eosinophilia    Nocturnal leg cramps 11/23/2012   Hematoma 11/04/2012   Hyperlipidemia type IIB / III 03/08/2008   Orthostatic hypotension 03/08/2008   Coronary atherosclerosis of native coronary  artery 03/08/2008    Orientation RESPIRATION BLADDER Height & Weight     Self  Normal Continent Weight: 160 lb 0.9 oz (72.6 kg) Height:  5\' 7"  (170.2 cm)  BEHAVIORAL SYMPTOMS/MOOD NEUROLOGICAL BOWEL NUTRITION STATUS      Continent Diet (See dc summary)  AMBULATORY STATUS COMMUNICATION OF NEEDS Skin   Extensive Assist Verbally Normal                       Personal Care Assistance Level of Assistance  Bathing, Feeding, Dressing, Total care Bathing Assistance: Limited assistance Feeding assistance: Limited assistance Dressing Assistance: Limited assistance Total Care Assistance: Maximum assistance   Functional Limitations Info  Sight, Hearing, Speech Sight Info: Adequate Hearing Info: Adequate Speech Info: Adequate    SPECIAL CARE FACTORS FREQUENCY  PT (By licensed PT), OT (By licensed OT)     PT Frequency: 5x weekly OT Frequency: 5x weekly            Contractures      Additional Factors Info  Code Status, Allergies Code Status Info: DNR- limited Allergies Info: Gabapentin           Current Medications (03/09/2023):  This is the current hospital active medication list Current Facility-Administered Medications  Medication Dose Route Frequency Provider Last Rate Last Admin   acetaminophen (TYLENOL) tablet 650 mg  650 mg Oral Q4H PRN Hughie Closs, MD   650 mg at 03/06/23 0936   albuterol (PROVENTIL) (2.5 MG/3ML) 0.083% nebulizer solution 3 mL  3  mL Inhalation Q6H PRN Hughie Closs, MD       amiodarone (PACERONE) tablet 100 mg  100 mg Oral Daily Hughie Closs, MD   100 mg at 03/09/23 0906   amLODipine (NORVASC) tablet 5 mg  5 mg Oral Daily Hughie Closs, MD   5 mg at 03/09/23 1610   aspirin EC tablet 81 mg  81 mg Oral Daily Hughie Closs, MD   81 mg at 03/09/23 9604   atorvastatin (LIPITOR) tablet 20 mg  20 mg Oral Daily Hughie Closs, MD   20 mg at 03/09/23 0906   enoxaparin (LOVENOX) injection 30 mg  30 mg Subcutaneous Daily Hughie Closs, MD   30 mg at  03/09/23 0907   escitalopram (LEXAPRO) tablet 10 mg  10 mg Oral Daily Hughie Closs, MD   10 mg at 03/09/23 5409   ezetimibe (ZETIA) tablet 10 mg  10 mg Oral Daily Hughie Closs, MD   10 mg at 03/09/23 0906   fluticasone furoate-vilanterol (BREO ELLIPTA) 200-25 MCG/ACT 1 puff  1 puff Inhalation Daily Hughie Closs, MD   1 puff at 03/09/23 8119   furosemide (LASIX) tablet 40 mg  40 mg Oral Daily Hughie Closs, MD   40 mg at 03/09/23 0906   insulin aspart (novoLOG) injection 0-5 Units  0-5 Units Subcutaneous QHS Pahwani, Daleen Bo, MD       insulin aspart (novoLOG) injection 0-9 Units  0-9 Units Subcutaneous TID WC Hughie Closs, MD   1 Units at 03/09/23 1224   memantine (NAMENDA) tablet 5 mg  5 mg Oral QHS Hughie Closs, MD   5 mg at 03/08/23 2129   ondansetron (ZOFRAN) injection 4 mg  4 mg Intravenous Q6H PRN Hughie Closs, MD       sodium chloride flush (NS) 0.9 % injection 3 mL  3 mL Intravenous Q12H Pahwani, Daleen Bo, MD   3 mL at 03/09/23 0908   sodium chloride flush (NS) 0.9 % injection 3 mL  3 mL Intravenous PRN Hughie Closs, MD         Discharge Medications: Please see discharge summary for a list of discharge medications.  Relevant Imaging Results:  Relevant Lab Results:   Additional Information SS: [id number]  Reva Bores, LCSWA

## 2023-03-09 NOTE — Progress Notes (Signed)
Mobility Specialist Progress Note:   03/09/23 1356  Mobility  Activity Transferred from chair to bed  Level of Assistance Modified independent, requires aide device or extra time  Assistive Device Front wheel walker  Distance Ambulated (ft) 3 ft  Activity Response Tolerated well  Mobility Referral Yes  Mobility visit 1 Mobility  Mobility Specialist Start Time (ACUTE ONLY) 1350  Mobility Specialist Stop Time (ACUTE ONLY) 1356  Mobility Specialist Time Calculation (min) (ACUTE ONLY) 6 min   Pt received in chair, requesting assistance back to bed. Pt receptive to commands only needing MinA verbal cues for direction. Mod to stand. CG to pivot. Pt left in bed with call bell in reach and all needs met. Daughter present.   [name]  Mobility Specialist Please contact via [employer] office at [phone]

## 2023-03-09 NOTE — Plan of Care (Signed)
  Problem: Fluid Volume: Goal: Ability to maintain a balanced intake and output will improve Outcome: Progressing   Problem: Nutritional: Goal: Maintenance of adequate nutrition will improve Outcome: Progressing   Problem: Coping: Goal: Level of anxiety will decrease Outcome: Progressing

## 2023-03-09 NOTE — Progress Notes (Signed)
PROGRESS NOTE    [name]  WUJ:[id number] DOB: 05/09/1933 DOA: 03/06/2023 PCP: Donita Brooks, MD   Brief Narrative:  HPI: [name] is a 89 y.o. male with medical history significant of CAD, diabetes, diastolic CHF, hyperlipidemia, dementia, paroxysmal atrial fibrillation, CKD stage IV, anemia of chronic disease was brought into the ED by wife due to concern of shortness of breath that has been ongoing for about a week.  Patient endorsed having increased shortness of breath with laying flat but not with exertion.  He also said that he was having burning urination a week ago but not now but his wife told me that he was still complaining of shortness of breath and patient tends to forget due to dementia.  He did not have any fever, he denies any other complaints at the moment.  Patient's friend or some family member was at the bedside, she called the wife and the wife was on the speaker phone during the whole time.   ED Course: Upon arrival to ED, he was slightly hypertensive but otherwise stable.  Patient was diagnosed to have pulmonary edema/CHF exacerbation based on elevated BNP, hypoxia and chest x-ray findings.  Hospital service were called for admission for further management of CHF exacerbation.  Patient had not received any medications in the ED.  Assessment & Plan:   Principal Problem:   Acute on chronic congestive heart failure (HCC) Active Problems:   Hyperlipidemia type IIB / III   Coronary atherosclerosis of native coronary artery   Type 2 diabetes mellitus (HCC)   Essential hypertension   CAD (coronary artery disease)   Fever  Acute hypoxic respiratory failure secondary to acute on chronic congestive heart failure with preserved ejection fraction/acute pulmonary edema, POA: Patient was requiring 2 L of oxygen, started on IV Lasix 40 mg twice daily, has had good diuresis, weaned to room air, walking with mobility today, briefly dipped to 89%.  Oxygen  saturation came up to over 90% without requiring oxygen.  Crackles resolved since 03/08/2023, patient doing well without requiring any oxygen and stable for discharge since 03/08/2023.  Per initial discussion with the wife, we were going to discharge him home with home health per her request however later during the day, she changed her mind and wanted him to go to SNF so discharge was held, [facility] consulted.  Currently awaiting SNF placement.  I will now transition him from IV to oral Lasix.    Fever: Patient developed fever of 100.7 after admission at 7:18 AM.  His wife and planned of him complaining of dysuria, UA was unremarkable, he was also ruled out of respiratory viral infections.  He is without symptoms and afebrile.  Paroxysmal atrial fibrillation: Currently in sinus rhythm.  Not on any anticoagulation.  Resumed amiodarone.   Dyslipidemia/hyperlipidemia: Continue statins/Zetia.   Essential hypertension: Controlled, continue amlodipine.   COPD: Stable.  Continue Breo Ellipta.   Type 2 diabetes mellitus: Last hemoglobin A1c available in chart is from March 2024 and was 6.2.  He is on Jardiance PTA.  Continue SSI.   Alzheimer's dementia: Resume Namenda.  Diarrhea: Only 1 episode charted, C. difficile was ordered, patient has not had any bowel movement in 24 hours, highly doubt C. difficile infection.  Right ankle pain: Pain improving.  No abnormality noted.  Generalized weakness: Seen by PT OT.  Wife prefers SNF, TOC working on placement.  DVT prophylaxis: enoxaparin (LOVENOX) injection 30 mg Start: 03/06/23 1000   Code Status: Limited: Do  not attempt resuscitation (DNR) -DNR-LIMITED -Do Not Intubate/DNI   Family Communication: Wife present at bedside.  Plan of care discussed in detail.  Status is: Inpatient Remains inpatient appropriate because: Medically stable since 03/08/2023, wife is now requesting SNF.  TOC on board.   Estimated body mass index is 25.07 kg/m as calculated from  the following:   Height as of this encounter: 5\' 7"  (1.702 m).   Weight as of this encounter: 72.6 kg.    Nutritional Assessment: Body mass index is 25.07 kg/m.Marland Kitchen Seen by dietician.  I agree with the assessment and plan as outlined below: Nutrition Status:        . Skin Assessment: I have examined the patient's skin and I agree with the wound assessment as performed by the wound care RN as outlined below:    Consultants:  None  Procedures:  None  Antimicrobials:  Anti-infectives (From admission, onward)    None         Subjective: Patient seen and examined, sleepy but arousable.  No complaints.  Objective: Vitals:   03/08/23 2016 03/09/23 0009 03/09/23 0315 03/09/23 0801  BP: (!) 137/57 139/61 138/75 (!) 131/55  Pulse: 67 68 73 68  Resp: 18 18 14 16   Temp: 98.3 F (36.8 C) 98.4 F (36.9 C) 97.9 F (36.6 C) 98.2 F (36.8 C)  TempSrc: Oral Oral Oral Oral  SpO2: 100% 92% 96% 95%  Weight:   72.6 kg   Height:        Intake/Output Summary (Last 24 hours) at 03/09/2023 0817 Last data filed at 03/09/2023 0802 Gross per 24 hour  Intake 1120 ml  Output 2000 ml  Net -880 ml   Filed Weights   03/07/23 0450 03/08/23 0637 03/09/23 0315  Weight: 76 kg 72.8 kg 72.6 kg    Examination:  General exam: Appears calm and comfortable  Respiratory system: Clear to auscultation. Respiratory effort normal. Cardiovascular system: S1 & S2 heard, RRR. No JVD, murmurs, rubs, gallops or clicks. No pedal edema. Gastrointestinal system: Abdomen is nondistended, soft and nontender. No organomegaly or masses felt. Normal bowel sounds heard. Central nervous system: Alert and oriented x 1-2. No focal neurological deficits. Extremities: Symmetric 5 x 5 power. Skin: No rashes, lesions or ulcers.   Data Reviewed: I have personally reviewed following labs and imaging studies  CBC: Recent Labs  Lab 03/06/23 0330 03/06/23 0846 03/08/23 0328  WBC 8.0 6.0 7.2  NEUTROABS  --   --   5.3  HGB 10.4* 9.4* 9.7*  HCT 31.8* 29.7* 29.5*  MCV 91.9 93.7 89.1  PLT 299 267 279   Basic Metabolic Panel: Recent Labs  Lab 03/06/23 0330 03/06/23 0846 03/07/23 0309 03/08/23 0328  NA 135  --  137 137  K 3.9  --  3.8 3.6  CL 103  --  104 101  CO2 20*  --  24 25  GLUCOSE 115*  --  92 102*  BUN 36*  --  34* 40*  CREATININE 2.45* 2.30* 2.69* 2.49*  CALCIUM 8.4*  --  8.3* 8.3*   GFR: Estimated Creatinine Clearance: 19.5 mL/min (A) (by C-G formula based on SCr of 2.49 mg/dL (H)). Liver Function Tests: Recent Labs  Lab 03/06/23 0330  AST 40  ALT 29  ALKPHOS 105  BILITOT 1.0  PROT 6.6  ALBUMIN 2.9*   No results for input(s): "LIPASE", "AMYLASE" in the last 168 hours. No results for input(s): "AMMONIA" in the last 168 hours. Coagulation Profile: No results for input(s): "  INR", "PROTIME" in the last 168 hours. Cardiac Enzymes: No results for input(s): "CKTOTAL", "CKMB", "CKMBINDEX", "TROPONINI" in the last 168 hours. BNP (last 3 results) No results for input(s): "PROBNP" in the last 8760 hours. HbA1C: No results for input(s): "HGBA1C" in the last 72 hours. CBG: Recent Labs  Lab 03/08/23 0644 03/08/23 1202 03/08/23 1654 03/08/23 2155 03/09/23 0607  GLUCAP 85 136* 138* 133* 115*   Lipid Profile: No results for input(s): "CHOL", "HDL", "LDLCALC", "TRIG", "CHOLHDL", "LDLDIRECT" in the last 72 hours. Thyroid Function Tests: No results for input(s): "TSH", "T4TOTAL", "FREET4", "T3FREE", "THYROIDAB" in the last 72 hours. Anemia Panel: No results for input(s): "VITAMINB12", "FOLATE", "FERRITIN", "TIBC", "IRON", "RETICCTPCT" in the last 72 hours. Sepsis Labs: No results for input(s): "PROCALCITON", "LATICACIDVEN" in the last 168 hours.  Recent Results (from the past 240 hours)  Respiratory (~20 pathogens) panel by PCR     Status: None   Collection Time: 03/07/23 12:19 PM   Specimen: Nasopharyngeal Swab; Respiratory  Result Value Ref Range Status   Adenovirus NOT  DETECTED NOT DETECTED Final   Coronavirus 229E NOT DETECTED NOT DETECTED Final    Comment: (NOTE) The Coronavirus on the Respiratory Panel, DOES NOT test for the novel  Coronavirus (2019 nCoV)    Coronavirus HKU1 NOT DETECTED NOT DETECTED Final   Coronavirus NL63 NOT DETECTED NOT DETECTED Final   Coronavirus OC43 NOT DETECTED NOT DETECTED Final   Metapneumovirus NOT DETECTED NOT DETECTED Final   Rhinovirus / Enterovirus NOT DETECTED NOT DETECTED Final   Influenza A NOT DETECTED NOT DETECTED Final   Influenza B NOT DETECTED NOT DETECTED Final   Parainfluenza Virus 1 NOT DETECTED NOT DETECTED Final   Parainfluenza Virus 2 NOT DETECTED NOT DETECTED Final   Parainfluenza Virus 3 NOT DETECTED NOT DETECTED Final   Parainfluenza Virus 4 NOT DETECTED NOT DETECTED Final   Respiratory Syncytial Virus NOT DETECTED NOT DETECTED Final   Bordetella pertussis NOT DETECTED NOT DETECTED Final   Bordetella Parapertussis NOT DETECTED NOT DETECTED Final   Chlamydophila pneumoniae NOT DETECTED NOT DETECTED Final   Mycoplasma pneumoniae NOT DETECTED NOT DETECTED Final    Comment: Performed at [facility], [address] [place], [place] [address]  Resp panel by RT-PCR (RSV, Flu A&B, Covid) Anterior Nasal Swab     Status: None   Collection Time: 03/07/23 12:19 PM   Specimen: Anterior Nasal Swab  Result Value Ref Range Status   SARS Coronavirus 2 by RT PCR NEGATIVE NEGATIVE Final   Influenza A by PCR NEGATIVE NEGATIVE Final   Influenza B by PCR NEGATIVE NEGATIVE Final    Comment: (NOTE) The Xpert Xpress SARS-CoV-2/FLU/RSV plus assay is intended as an aid in the diagnosis of influenza from Nasopharyngeal swab specimens and should not be used as a sole basis for treatment. Nasal washings and aspirates are unacceptable for Xpert Xpress SARS-CoV-2/FLU/RSV testing.  Fact Sheet for Patients: BloggerCourse.com  Fact Sheet for Healthcare  Providers: SeriousBroker.it  This test is not yet approved or cleared by the [place] FDA and has been authorized for detection and/or diagnosis of SARS-CoV-2 by FDA under an Emergency Use Authorization (EUA). This EUA will remain in effect (meaning this test can be used) for the duration of the COVID-19 declaration under Section 564(b)(1) of the Act, 21 U.S.C. section 360bbb-3(b)(1), unless the authorization is terminated or revoked.     Resp Syncytial Virus by PCR NEGATIVE NEGATIVE Final    Comment: (NOTE) Fact Sheet for Patients: BloggerCourse.com  Fact  Sheet for Healthcare Providers: SeriousBroker.it  This test is not yet approved or cleared by the [place] and has been authorized for detection and/or diagnosis of SARS-CoV-2 by FDA under an Emergency Use Authorization (EUA). This EUA will remain in effect (meaning this test can be used) for the duration of the COVID-19 declaration under Section 564(b)(1) of the Act, 21 U.S.C. section 360bbb-3(b)(1), unless the authorization is terminated or revoked.  Performed at [facility], [address], [place], [place] [address]      Radiology Studies: No results found.   Scheduled Meds:  amiodarone  100 mg Oral Daily   amLODipine  5 mg Oral Daily   aspirin EC  81 mg Oral Daily   atorvastatin  20 mg Oral Daily   enoxaparin (LOVENOX) injection  30 mg Subcutaneous Daily   escitalopram  10 mg Oral Daily   ezetimibe  10 mg Oral Daily   fluticasone furoate-vilanterol  1 puff Inhalation Daily   furosemide  40 mg Oral Daily   insulin aspart  0-5 Units Subcutaneous QHS   insulin aspart  0-9 Units Subcutaneous TID WC   memantine  5 mg Oral QHS   sodium chloride flush  3 mL Intravenous Q12H   Continuous Infusions:   LOS: 3 days   Hughie Closs, MD [facility]  03/09/2023, 8:17 AM   *Please note that this is a verbal dictation  therefore any spelling or grammatical errors are due to the "Dragon Medical One" system interpretation.  Please page via Amion and do not message via secure chat for urgent patient care matters. Secure chat can be used for non urgent patient care matters.  How to contact the [facility] Attending or Consulting provider 7A - 7P or covering provider during after hours 7P -7A, for this patient?  Check the care team in [facility] and look for a) attending/consulting TRH provider listed and b) the [facility] team listed. Page or secure chat 7A-7P. Log into [email or web address] and use [place]'s universal password to access. If you do not have the password, please contact the hospital operator. Locate the [facility] provider you are looking for under [facility] and page to a number that you can be directly reached. If you still have difficulty reaching the provider, please page the [facility] (Director on Call) for the Hospitalists listed on amion for assistance.

## 2023-03-09 NOTE — Progress Notes (Signed)
Mobility Specialist Progress Note:   03/09/23 1319  Mobility  Activity Ambulated with assistance in hallway  Level of Assistance Moderate assist, patient does 50-74% (Chair follow)  Assistive Device Front wheel walker  Distance Ambulated (ft) 40 ft  Activity Response Tolerated well  Mobility Referral Yes  Mobility visit 1 Mobility  Mobility Specialist Start Time (ACUTE ONLY) 1222  Mobility Specialist Stop Time (ACUTE ONLY) 1240  Mobility Specialist Time Calculation (min) (ACUTE ONLY) 18 min   Pt received in bed, agreeable to mobility. Pt needing Max verbal cues to direct pt EOB. ModA bed mobility. CG to stand and ambulate in hallway with chair follow for safety. Pt c/o R foot pain and slight dizziness but stated dizziness resolved during ambulation. Pt ambulated 35ft in hallway before seated rest break. D/t fatigue pt unable to continue and wheeled back to room. VSS throughout. Pt left in chair with call bell in reach and all needs met. Family present.   Leory Plowman  Mobility Specialist Please contact via [employer] office at [phone]

## 2023-03-10 DIAGNOSIS — I5033 Acute on chronic diastolic (congestive) heart failure: Secondary | ICD-10-CM | POA: Diagnosis not present

## 2023-03-10 LAB — GLUCOSE, CAPILLARY
Glucose-Capillary: 96 mg/dL (ref 70–99)
Glucose-Capillary: 117 mg/dL — ABNORMAL HIGH (ref 70–99)
Glucose-Capillary: 99 mg/dL (ref 70–99)
Glucose-Capillary: 104 mg/dL — ABNORMAL HIGH (ref 70–99)

## 2023-03-10 MED ORDER — AMLODIPINE BESYLATE 5 MG PO TABS
5.0000 mg | ORAL_TABLET | Freq: Every day | ORAL | Status: DC
Start: 2023-03-10 — End: 2023-03-12
  Administered 2023-03-10 – 2023-03-12 (×3): 5 mg via ORAL
  Filled 2023-03-10 (×3): qty 1

## 2023-03-10 MED ORDER — METOPROLOL TARTRATE 5 MG/5ML IV SOLN
2.5000 mg | INTRAVENOUS | Status: DC | PRN
  Filled 2023-03-10: qty 5

## 2023-03-10 MED ORDER — AMIODARONE HCL 200 MG PO TABS
100.0000 mg | ORAL_TABLET | Freq: Every day | ORAL | Status: DC
  Administered 2023-03-10 – 2023-03-12 (×3): 100 mg via ORAL
  Filled 2023-03-10 (×3): qty 1

## 2023-03-10 NOTE — Plan of Care (Signed)

## 2023-03-10 NOTE — Progress Notes (Addendum)
RN reported that patient heart rate persistently elevated to upper 116 to 130.  Per chart review patient has history of atrial fibrillation.  Recommending to give the amiodarone 100 mg now instead of 10 AM and added Lopressor 2.5 mg as needed for heart rate above 120.  If patient heart rate persistently remain elevated after resolution of the acuity of the illness patient will need to oral Lopressor.  Tereasa Coop, MD [facility] 03/10/2023, 5:41 AM

## 2023-03-10 NOTE — TOC Progression Note (Signed)
Transition of Care [facility]) - Initial/Assessment Note    Patient Details  Name: [name] MRN: [id number] Date of Birth: [birth year]  Transition of Care [facility]) CM/SW Contact:    Ralene Bathe, LCSW Phone Number: 03/10/2023, 11:40 AM  Clinical Narrative:                 RE: [name] Date of Birth: [birth year] Date: 03/10/2023  Please be advised that the above-named patient has a primary diagnosis of dementia which supersedes any psychiatric diagnosis. Patient will require a short-term nursing home stay - anticipated 30 days or less for rehabilitation and strengthening.  The plan is for return home.   Expected Discharge Plan: Skilled Nursing Facility Barriers to Discharge: SNF Pending bed offer, Insurance Authorization   Patient Goals and CMS Choice Patient states their goals for this hospitalization and ongoing recovery are:: return home after rehab CMS Medicare.gov Compare Post Acute Care list provided to:: Patient Represenative (must comment) Choice offered to / list presented to : Spouse      Expected Discharge Plan and Services In-house Referral: Clinical Social Work Discharge Planning Services: CM Consult Post Acute Care Choice: Durable Medical Equipment, Home Health, Skilled Nursing Facility Living arrangements for the past 2 months: Single Family Home Expected Discharge Date: 03/08/23               DME Arranged: Hospital bed         HH Arranged: RN, Disease Management, PT, OT HH Agency: [facility]        Prior Living Arrangements/Services Living arrangements for the past 2 months: Single Family Home Lives with:: Spouse Patient language and need for interpreter reviewed:: Yes Do you feel safe going back to the place where you live?: Yes      Need for Family Participation in Patient Care: Yes (Comment) Care giver support system in place?: Yes (comment) Current home services: DME Criminal Activity/Legal Involvement Pertinent to Current  Situation/Hospitalization: No - Comment as needed  Activities of Daily Living      Permission Sought/Granted Permission sought to share information with : Facility Industrial/product designer granted to share information with : Yes, Verbal Permission Granted     Permission granted to share info w AGENCY: SNF/HH/DME        Emotional Assessment Appearance:: Appears stated age Attitude/Demeanor/Rapport: Inconsistent Affect (typically observed): Pleasant Orientation: : Oriented to Self Alcohol / Substance Use: Not Applicable Psych Involvement: No (comment)  Admission diagnosis:  Hypoxia [R09.02] Acute on chronic congestive heart failure (HCC) [I50.9] Acute on chronic congestive heart failure, unspecified heart failure type (HCC) [I50.9] Patient Active Problem List   Diagnosis Date Noted   Acute on chronic congestive heart failure (HCC) 03/06/2023   Pressure sore 01/17/2023   Hallux valgus, acquired 08/23/2022   Fever 05/09/2022   Chest pain 05/05/2022   CKD (chronic kidney disease) stage 3, GFR 30-59 ml/min (HCC) 05/05/2022   Atrial fibrillation with RVR (HCC) 04/27/2022   Itching of ear 07/14/2021   Otalgia of left ear 05/11/2021   Otorrhagia of left ear 03/30/2021   Dizziness 03/08/2020   Major neurocognitive disorder due to possible Alzheimer's disease, without behavioral disturbance 02/29/2020   CAD (coronary artery disease)    MI (myocardial infarction)    Trigeminal neuralgia    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 AT [address] OF [facility] [address] [place] [address] Phone: [phone] Fax: [phone]     Social Drivers of Health (SDOH) Social History: SDOH Screenings   Food Insecurity: No Food Insecurity (03/06/2023)  Housing: Unknown (03/06/2023)  Transportation Needs: No Transportation Needs (03/06/2023)  Utilities: Not At Risk (03/06/2023)  Alcohol Screen: Low Risk  (04/07/2018)  Depression (PHQ2-9): Medium Risk (01/24/2023)  Financial Resource Strain: Low Risk  (02/09/2022)  Social Connections: Unknown (03/06/2023)  Tobacco Use: Low Risk  (01/24/2023)   SDOH Interventions:     Readmission Risk Interventions     No data to display

## 2023-03-10 NOTE — TOC Progression Note (Signed)
Transition of Care [facility]) - Initial/Assessment Note    Patient Details  Name: [name] MRN: [id number] Date of Birth: April 16, 1933  [facility]) CM/SW Contact:    Ralene Bathe, LCSW Phone Number: 03/10/2023, 11:28 AM  Clinical Narrative:                 CSW contacted patient's spouse to present bed offers. There was no answer.  LM requesting a returned call.    TOC will continue to follow.  Expected Discharge Plan: Skilled Nursing Facility Barriers to Discharge: SNF Pending bed offer, Insurance Authorization   Patient Goals and CMS Choice Patient states their goals for this hospitalization and ongoing recovery are:: return home after rehab CMS Medicare.gov Compare Post Acute Care list provided to:: Patient Represenative (must comment) Choice offered to / list presented to : Spouse      Expected Discharge Plan and Services In-house Referral: Clinical Social Work Discharge Planning Services: CM Consult Post Acute Care Choice: Durable Medical Equipment, Home Health, Skilled Nursing Facility Living arrangements for the past 2 months: Single Family Home Expected Discharge Date: 03/08/23               DME Arranged: Hospital bed         HH Arranged: RN, Disease Management, PT, OT HH Agency: [facility]        Prior Living Arrangements/Services Living arrangements for the past 2 months: Single Family Home Lives with:: Spouse Patient language and need for interpreter reviewed:: Yes Do you feel safe going back to the place where you live?: Yes      Need for Family Participation in Patient Care: Yes (Comment) Care giver support system in place?: Yes (comment) Current home services: DME Criminal Activity/Legal Involvement Pertinent to Current Situation/Hospitalization: No - Comment as needed  Activities of Daily Living      Permission Sought/Granted Permission sought to share information with : Facility Industrial/product designer  granted to share information with : Yes, Verbal Permission Granted     Permission granted to share info w AGENCY: SNF/HH/DME        Emotional Assessment Appearance:: Appears stated age Attitude/Demeanor/Rapport: Inconsistent Affect (typically observed): Pleasant Orientation: : Oriented to Self Alcohol / Substance Use: Not Applicable Psych Involvement: No (comment)  Admission diagnosis:  Hypoxia [R09.02] Acute on chronic congestive heart failure (HCC) [I50.9] Acute on chronic congestive heart failure, unspecified heart failure type (HCC) [I50.9] Patient Active Problem List   Diagnosis Date Noted   Acute on chronic congestive heart failure (HCC) 03/06/2023   Pressure sore 01/17/2023   Hallux valgus, acquired 08/23/2022   Fever 05/09/2022   Chest pain 05/05/2022   CKD (chronic kidney disease) stage 3, GFR 30-59 ml/min (HCC) 05/05/2022   Atrial fibrillation with RVR (HCC) 04/27/2022   Itching of ear 07/14/2021   Otalgia of left ear 05/11/2021   Otorrhagia of left ear 03/30/2021   Dizziness 03/08/2020   Major neurocognitive disorder due to possible Alzheimer's disease, without behavioral disturbance 02/29/2020   CAD (coronary artery disease)    MI (myocardial infarction)    Trigeminal neuralgia    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 AT [address] OF [facility] [address] [place] [address] Phone: [phone] Fax: [phone]     Social Drivers of Health (SDOH) Social History: SDOH Screenings   Food Insecurity: No Food Insecurity (03/06/2023)  Housing: Unknown (03/06/2023)  Transportation Needs: No Transportation Needs (03/06/2023)  Utilities: Not At Risk (03/06/2023)  Alcohol Screen: Low Risk  (04/07/2018)  Depression (PHQ2-9): Medium Risk (01/24/2023)  Financial Resource Strain: Low Risk  (02/09/2022)  Social Connections: Unknown (03/06/2023)  Tobacco Use: Low Risk  (01/24/2023)   SDOH Interventions:     Readmission Risk Interventions     No data to display

## 2023-03-10 NOTE — Progress Notes (Signed)
PROGRESS NOTE    [name]  ZOX:[id number] DOB: 01-Nov-1933 DOA: 03/06/2023 PCP: Donita Brooks, MD   Brief Narrative:  HPI: [name] is a 89 y.o. male with medical history significant of CAD, diabetes, diastolic CHF, hyperlipidemia, dementia, paroxysmal atrial fibrillation, CKD stage IV, anemia of chronic disease was brought into the ED by wife due to concern of shortness of breath that has been ongoing for about a week.  Patient endorsed having increased shortness of breath with laying flat but not with exertion.  He also said that he was having burning urination a week ago but not now but his wife told me that he was still complaining of shortness of breath and patient tends to forget due to dementia.  He did not have any fever, he denies any other complaints at the moment.  Patient's friend or some family member was at the bedside, she called the wife and the wife was on the speaker phone during the whole time.   ED Course: Upon arrival to ED, he was slightly hypertensive but otherwise stable.  Patient was diagnosed to have pulmonary edema/CHF exacerbation based on elevated BNP, hypoxia and chest x-ray findings.  Hospital service were called for admission for further management of CHF exacerbation.  Patient had not received any medications in the ED.  Assessment & Plan:   Principal Problem:   Acute on chronic congestive heart failure (HCC) Active Problems:   Hyperlipidemia type IIB / III   Coronary atherosclerosis of native coronary artery   Type 2 diabetes mellitus (HCC)   Essential hypertension   CAD (coronary artery disease)   Fever  Acute hypoxic respiratory failure secondary to acute on chronic congestive heart failure with preserved ejection fraction/acute pulmonary edema, POA: Patient was requiring 2 L of oxygen, started on IV Lasix 40 mg twice daily, has had good diuresis, weaned to room air, walking with mobility today, briefly dipped to 89%.  Oxygen  saturation came up to over 90% without requiring oxygen.  Crackles resolved since 03/08/2023, patient doing well without requiring any oxygen and stable for discharge since 03/08/2023.  Per initial discussion with the wife, we were going to discharge him home with home health per her request however later during the day, she changed her mind and wanted him to go to SNF so discharge was held, [facility] consulted.  Currently awaiting SNF placement.  I will now transition him from IV to oral Lasix.    Fever: Patient developed fever of 100.7 after admission at 7:18 AM.  His wife and planned of him complaining of dysuria, UA was unremarkable, he was also ruled out of respiratory viral infections.  He is without symptoms and afebrile.  Paroxysmal atrial fibrillation: Currently in sinus rhythm.  Not on any anticoagulation.  Continue amiodarone.  Noted that patient had rapid heart rate in the morning, amiodarone dose was given earlier as well as IV Lopressor was ordered however wife did not allow the nurse to give him IV Lopressor but currently he was in sinus rhythm with rates around 70.   Dyslipidemia/hyperlipidemia: Continue statins/Zetia.   Essential hypertension: Controlled, continue amlodipine.   COPD: Stable.  Continue Breo Ellipta.   Type 2 diabetes mellitus: Last hemoglobin A1c available in chart is from March 2024 and was 6.2.  He is on Jardiance PTA.  Continue SSI.   Alzheimer's dementia: Resume Namenda.  Diarrhea: Only 1 episode charted, C. difficile was ordered, patient has not had any bowel movement in 24 hours, highly  doubt C. difficile infection.  Right ankle pain: Pain improving.  No abnormality noted.  Generalized weakness: Seen by PT OT.  Wife prefers SNF, TOC working on placement.  DVT prophylaxis: enoxaparin (LOVENOX) injection 30 mg Start: 03/06/23 1000   Code Status: Limited: Do not attempt resuscitation (DNR) -DNR-LIMITED -Do Not Intubate/DNI   Family Communication: Wife present at  bedside.  Plan of care discussed in detail.  Status is: Inpatient Remains inpatient appropriate because: Medically stable since 03/08/2023, wife is now requesting SNF.  TOC on board.   Estimated body mass index is 24.83 kg/m as calculated from the following:   Height as of this encounter: 5\' 7"  (1.702 m).   Weight as of this encounter: 71.9 kg.    Nutritional Assessment: Body mass index is 24.83 kg/m.Marland Kitchen Seen by dietician.  I agree with the assessment and plan as outlined below: Nutrition Status:        . Skin Assessment: I have examined the patient's skin and I agree with the wound assessment as performed by the wound care RN as outlined below:    Consultants:  None  Procedures:  None  Antimicrobials:  Anti-infectives (From admission, onward)    None         Subjective: Patient seen and examined.  He has no complaints at all.  Wife at the bedside.  Objective: Vitals:   03/10/23 0601 03/10/23 0753 03/10/23 0812 03/10/23 1211  BP: 123/68 (!) 108/90  (!) 137/59  Pulse:  (!) 115 (!) 110 67  Resp:  20  16  Temp:  98.1 F (36.7 C)  98.3 F (36.8 C)  TempSrc:  Oral  Oral  SpO2:  95% 96% 95%  Weight:      Height:        Intake/Output Summary (Last 24 hours) at 03/10/2023 1338 Last data filed at 03/10/2023 0900 Gross per 24 hour  Intake 460 ml  Output 1250 ml  Net -790 ml   Filed Weights   03/08/23 0637 03/09/23 0315 03/10/23 0315  Weight: 72.8 kg 72.6 kg 71.9 kg    Examination:  General exam: Appears calm and comfortable  Respiratory system: Clear to auscultation. Respiratory effort normal. Cardiovascular system: S1 & S2 heard, RRR. No JVD, murmurs, rubs, gallops or clicks. No pedal edema. Gastrointestinal system: Abdomen is nondistended, soft and nontender. No organomegaly or masses felt. Normal bowel sounds heard. Central nervous system: Alert and oriented x 1-2. No focal neurological deficits. Extremities: Symmetric 5 x 5 power. Skin: No rashes,  lesions or ulcers.   Data Reviewed: I have personally reviewed following labs and imaging studies  CBC: Recent Labs  Lab 03/06/23 0330 03/06/23 0846 03/08/23 0328  WBC 8.0 6.0 7.2  NEUTROABS  --   --  5.3  HGB 10.4* 9.4* 9.7*  HCT 31.8* 29.7* 29.5*  MCV 91.9 93.7 89.1  PLT 299 267 279   Basic Metabolic Panel: Recent Labs  Lab 03/06/23 0330 03/06/23 0846 03/07/23 0309 03/08/23 0328 03/09/23 0855  NA 135  --  137 137 133*  K 3.9  --  3.8 3.6 3.9  CL 103  --  104 101 98  CO2 20*  --  24 25 24   GLUCOSE 115*  --  92 102* 170*  BUN 36*  --  34* 40* 39*  CREATININE 2.45* 2.30* 2.69* 2.49* 2.50*  CALCIUM 8.4*  --  8.3* 8.3* 8.2*   GFR: Estimated Creatinine Clearance: 19.5 mL/min (A) (by C-G formula based on SCr of 2.5 mg/dL (  H)). Liver Function Tests: Recent Labs  Lab 03/06/23 0330  AST 40  ALT 29  ALKPHOS 105  BILITOT 1.0  PROT 6.6  ALBUMIN 2.9*   No results for input(s): "LIPASE", "AMYLASE" in the last 168 hours. No results for input(s): "AMMONIA" in the last 168 hours. Coagulation Profile: No results for input(s): "INR", "PROTIME" in the last 168 hours. Cardiac Enzymes: No results for input(s): "CKTOTAL", "CKMB", "CKMBINDEX", "TROPONINI" in the last 168 hours. BNP (last 3 results) No results for input(s): "PROBNP" in the last 8760 hours. HbA1C: No results for input(s): "HGBA1C" in the last 72 hours. CBG: Recent Labs  Lab 03/09/23 1101 03/09/23 1710 03/09/23 2100 03/10/23 0554 03/10/23 1208  GLUCAP 147* 97 156* 96 117*   Lipid Profile: No results for input(s): "CHOL", "HDL", "LDLCALC", "TRIG", "CHOLHDL", "LDLDIRECT" in the last 72 hours. Thyroid Function Tests: No results for input(s): "TSH", "T4TOTAL", "FREET4", "T3FREE", "THYROIDAB" in the last 72 hours. Anemia Panel: No results for input(s): "VITAMINB12", "FOLATE", "FERRITIN", "TIBC", "IRON", "RETICCTPCT" in the last 72 hours. Sepsis Labs: No results for input(s): "PROCALCITON", "LATICACIDVEN" in  the last 168 hours.  Recent Results (from the past 240 hours)  Respiratory (~20 pathogens) panel by PCR     Status: None   Collection Time: 03/07/23 12:19 PM   Specimen: Nasopharyngeal Swab; Respiratory  Result Value Ref Range Status   Adenovirus NOT DETECTED NOT DETECTED Final   Coronavirus 229E NOT DETECTED NOT DETECTED Final    Comment: (NOTE) The Coronavirus on the Respiratory Panel, DOES NOT test for the novel  Coronavirus (2019 nCoV)    Coronavirus HKU1 NOT DETECTED NOT DETECTED Final   Coronavirus NL63 NOT DETECTED NOT DETECTED Final   Coronavirus OC43 NOT DETECTED NOT DETECTED Final   Metapneumovirus NOT DETECTED NOT DETECTED Final   Rhinovirus / Enterovirus NOT DETECTED NOT DETECTED Final   Influenza A NOT DETECTED NOT DETECTED Final   Influenza B NOT DETECTED NOT DETECTED Final   Parainfluenza Virus 1 NOT DETECTED NOT DETECTED Final   Parainfluenza Virus 2 NOT DETECTED NOT DETECTED Final   Parainfluenza Virus 3 NOT DETECTED NOT DETECTED Final   Parainfluenza Virus 4 NOT DETECTED NOT DETECTED Final   Respiratory Syncytial Virus NOT DETECTED NOT DETECTED Final   Bordetella pertussis NOT DETECTED NOT DETECTED Final   Bordetella Parapertussis NOT DETECTED NOT DETECTED Final   Chlamydophila pneumoniae NOT DETECTED NOT DETECTED Final   Mycoplasma pneumoniae NOT DETECTED NOT DETECTED Final    Comment: Performed at [facility], [address], [place], [place] [address]  Resp panel by RT-PCR (RSV, Flu A&B, Covid) Anterior Nasal Swab     Status: None   Collection Time: 03/07/23 12:19 PM   Specimen: Anterior Nasal Swab  Result Value Ref Range Status   SARS Coronavirus 2 by RT PCR NEGATIVE NEGATIVE Final   Influenza A by PCR NEGATIVE NEGATIVE Final   Influenza B by PCR NEGATIVE NEGATIVE Final    Comment: (NOTE) The Xpert Xpress SARS-CoV-2/FLU/RSV plus assay is intended as an aid in the diagnosis of influenza from Nasopharyngeal swab specimens and should not be used as a  sole basis for treatment. Nasal washings and aspirates are unacceptable for Xpert Xpress SARS-CoV-2/FLU/RSV testing.  Fact Sheet for Patients: BloggerCourse.com  Fact Sheet for Healthcare Providers: SeriousBroker.it  This test is not yet approved or cleared by the [place] FDA and has been authorized for detection and/or diagnosis of SARS-CoV-2 by FDA under an Emergency Use Authorization (EUA). This EUA will  remain in effect (meaning this test can be used) for the duration of the COVID-19 declaration under Section 564(b)(1) of the Act, 21 U.S.C. section 360bbb-3(b)(1), unless the authorization is terminated or revoked.     Resp Syncytial Virus by PCR NEGATIVE NEGATIVE Final    Comment: (NOTE) Fact Sheet for Patients: BloggerCourse.com  Fact Sheet for Healthcare Providers: SeriousBroker.it  This test is not yet approved or cleared by the [place] FDA and has been authorized for detection and/or diagnosis of SARS-CoV-2 by FDA under an Emergency Use Authorization (EUA). This EUA will remain in effect (meaning this test can be used) for the duration of the COVID-19 declaration under Section 564(b)(1) of the Act, 21 U.S.C. section 360bbb-3(b)(1), unless the authorization is terminated or revoked.  Performed at [facility], [address] [place], [place] [address]      Radiology Studies: No results found.   Scheduled Meds:  amiodarone  100 mg Oral Daily   amLODipine  5 mg Oral Daily   aspirin EC  81 mg Oral Daily   atorvastatin  20 mg Oral Daily   enoxaparin (LOVENOX) injection  30 mg Subcutaneous Daily   escitalopram  10 mg Oral Daily   ezetimibe  10 mg Oral Daily   fluticasone furoate-vilanterol  1 puff Inhalation Daily   insulin aspart  0-5 Units Subcutaneous QHS   insulin aspart  0-9 Units Subcutaneous TID WC   memantine  5 mg Oral QHS   sodium  chloride flush  3 mL Intravenous Q12H   Continuous Infusions:   LOS: 4 days   Hughie Closs, MD [facility]  03/10/2023, 1:38 PM   *Please note that this is a verbal dictation therefore any spelling or grammatical errors are due to the "Dragon Medical One" system interpretation.  Please page via Amion and do not message via secure chat for urgent patient care matters. Secure chat can be used for non urgent patient care matters.  How to contact the [facility] Attending or Consulting provider 7A - 7P or covering provider during after hours 7P -7A, for this patient?  Check the care team in [facility] and look for a) attending/consulting TRH provider listed and b) the [facility] team listed. Page or secure chat 7A-7P. Log into [email or web address] and use 's universal password to access. If you do not have the password, please contact the hospital operator. Locate the [facility] provider you are looking for under [facility] and page to a number that you can be directly reached. If you still have difficulty reaching the provider, please page the [facility] (Director on Call) for the Hospitalists listed on amion for assistance.

## 2023-03-10 NOTE — Progress Notes (Addendum)
PT HR increased to 116-130s and sustained at 115. Yellow Mews reported to Charge RN Tim and oncall MD S.Sundil notified.   New orders given.  Orders to give 10 am dose of amiodarone noted in the chart and MAR to be 100 mg. RN will reach out to MD to clarify order. Pt's wife states she is not comfortable with the IV metoprolol because she wants him to get up and walk today to prepare for home. Education provided. Pt does not have an IV. IV consult placed and pt's wife wants to speak with a provider during morning rounds.

## 2023-03-11 DIAGNOSIS — I5033 Acute on chronic diastolic (congestive) heart failure: Secondary | ICD-10-CM | POA: Diagnosis not present

## 2023-03-11 LAB — GLUCOSE, CAPILLARY
Glucose-Capillary: 104 mg/dL — ABNORMAL HIGH (ref 70–99)
Glucose-Capillary: 162 mg/dL — ABNORMAL HIGH (ref 70–99)
Glucose-Capillary: 93 mg/dL (ref 70–99)
Glucose-Capillary: 206 mg/dL — ABNORMAL HIGH (ref 70–99)
Glucose-Capillary: 128 mg/dL — ABNORMAL HIGH (ref 70–99)

## 2023-03-11 LAB — BASIC METABOLIC PANEL
Anion gap: 11 (ref 5–15)
BUN: 43 mg/dL — ABNORMAL HIGH (ref 8–23)
CO2: 24 mmol/L (ref 22–32)
Calcium: 8.5 mg/dL — ABNORMAL LOW (ref 8.9–10.3)
Chloride: 98 mmol/L (ref 98–111)
Creatinine, Ser: 2.65 mg/dL — ABNORMAL HIGH (ref 0.61–1.24)
GFR, Estimated: 23 mL/min — ABNORMAL LOW (ref >60)
Glucose, Bld: 113 mg/dL — ABNORMAL HIGH (ref 70–99)
Potassium: 3.4 mmol/L — ABNORMAL LOW (ref 3.5–5.1)
Sodium: 133 mmol/L — ABNORMAL LOW (ref 135–145)

## 2023-03-11 MED ORDER — CARBAMIDE PEROXIDE 6.5 % OT SOLN
5.0000 [drp] | Freq: Two times a day (BID) | OTIC | Status: DC
Start: 2023-03-11 — End: 2023-03-12
  Administered 2023-03-12: 5 [drp] via OTIC
  Filled 2023-03-11: qty 15

## 2023-03-11 NOTE — Progress Notes (Signed)
PROGRESS NOTE    [name]  WUJ:[id number] DOB: 1933/07/07 DOA: 03/06/2023 PCP: Donita Brooks, MD   Brief Narrative:  HPI: [name] is a 89 y.o. male with medical history significant of CAD, diabetes, diastolic CHF, hyperlipidemia, dementia, paroxysmal atrial fibrillation, CKD stage IV, anemia of chronic disease was brought into the ED by wife due to concern of shortness of breath that has been ongoing for about a week.  Patient endorsed having increased shortness of breath with laying flat but not with exertion.  He also said that he was having burning urination a week ago but not now but his wife told me that he was still complaining of shortness of breath and patient tends to forget due to dementia.  He did not have any fever, he denies any other complaints at the moment.  Patient's friend or some family member was at the bedside, she called the wife and the wife was on the speaker phone during the whole time.   ED Course: Upon arrival to ED, he was slightly hypertensive but otherwise stable.  Patient was diagnosed to have pulmonary edema/CHF exacerbation based on elevated BNP, hypoxia and chest x-ray findings.  Hospital service were called for admission for further management of CHF exacerbation.  Patient had not received any medications in the ED.  Assessment & Plan:   Principal Problem:   Acute on chronic congestive heart failure (HCC) Active Problems:   Hyperlipidemia type IIB / III   Coronary atherosclerosis of native coronary artery   Type 2 diabetes mellitus (HCC)   Essential hypertension   CAD (coronary artery disease)   Fever  Acute hypoxic respiratory failure secondary to acute on chronic congestive heart failure with preserved ejection fraction/acute pulmonary edema, POA: Patient was requiring 2 L of oxygen, started on IV Lasix 40 mg twice daily, has had good diuresis, weaned to room air, walking with mobility today, briefly dipped to 89%.  Oxygen  saturation came up to over 90% without requiring oxygen.  Crackles resolved since 03/08/2023, patient doing well without requiring any oxygen and stable for discharge since 03/08/2023.  Per initial discussion with the wife, we were going to discharge him home with home health per her request however later during the day, she changed her mind and wanted him to go to SNF so discharge was held, [facility] consulted.  Currently awaiting SNF placement.  Patient remains on oral Lasix.   Fever: Patient developed fever of 100.7 after admission at 7:18 AM.  His wife and planned of him complaining of dysuria, UA was unremarkable, he was also ruled out of respiratory viral infections.  He is without symptoms and afebrile.  Paroxysmal atrial fibrillation: Currently in sinus rhythm.  Not on any anticoagulation.  Continue amiodarone.    Dyslipidemia/hyperlipidemia: Continue statins/Zetia.   Essential hypertension: Controlled, continue amlodipine.   COPD: Stable.  Continue Breo Ellipta.   Type 2 diabetes mellitus: Last hemoglobin A1c available in chart is from March 2024 and was 6.2.  He is on Jardiance PTA.  Continue SSI.   Alzheimer's dementia: Resume Namenda.  Diarrhea: Only 1 episode charted, C. difficile was ordered, patient has not had any bowel movement in 24 hours, highly doubt C. difficile infection.  Right ankle pain: Resolved  Generalized weakness: Seen by PT OT.  Wife prefers SNF, TOC working on placement.  DVT prophylaxis: enoxaparin (LOVENOX) injection 30 mg Start: 03/06/23 1000   Code Status: Limited: Do not attempt resuscitation (DNR) -DNR-LIMITED -Do Not Intubate/DNI  Family Communication: Wife present at bedside.  Plan of care discussed in detail.  Status is: Inpatient Remains inpatient appropriate because: Medically stable since 03/08/2023, wife is now requesting SNF.  TOC on board.   Estimated body mass index is 24.83 kg/m as calculated from the following:   Height as of this encounter:  5\' 7"  (1.702 m).   Weight as of this encounter: 71.9 kg.    Nutritional Assessment: Body mass index is 24.83 kg/m.Marland Kitchen Seen by dietician.  I agree with the assessment and plan as outlined below: Nutrition Status:        . Skin Assessment: I have examined the patient's skin and I agree with the wound assessment as performed by the wound care RN as outlined below:    Consultants:  None  Procedures:  None  Antimicrobials:  Anti-infectives (From admission, onward)    None         Subjective: Seen and examined.  He has no complaints.  Wife at the bedside.  Objective: Vitals:   03/11/23 0852 03/11/23 0900 03/11/23 0910 03/11/23 1000  BP: (!) 141/57     Pulse: 73  69 66  Resp: 20 20 14  (!) 24  Temp: 98.4 F (36.9 C)     TempSrc: Oral     SpO2:   91% 92%  Weight:      Height:        Intake/Output Summary (Last 24 hours) at 03/11/2023 1037 Last data filed at 03/11/2023 0910 Gross per 24 hour  Intake 240 ml  Output 1900 ml  Net -1660 ml   Filed Weights   03/08/23 0637 03/09/23 0315 03/10/23 0315  Weight: 72.8 kg 72.6 kg 71.9 kg    Examination:  General exam: Appears calm and comfortable  Respiratory system: Clear to auscultation. Respiratory effort normal. Cardiovascular system: S1 & S2 heard, RRR. No JVD, murmurs, rubs, gallops or clicks. No pedal edema. Gastrointestinal system: Abdomen is nondistended, soft and nontender. No organomegaly or masses felt. Normal bowel sounds heard. Central nervous system: Alert and oriented x 1-2. No focal neurological deficits. Extremities: Symmetric 5 x 5 power. Skin: No rashes, lesions or ulcers.   Data Reviewed: I have personally reviewed following labs and imaging studies  CBC: Recent Labs  Lab 03/06/23 0330 03/06/23 0846 03/08/23 0328  WBC 8.0 6.0 7.2  NEUTROABS  --   --  5.3  HGB 10.4* 9.4* 9.7*  HCT 31.8* 29.7* 29.5*  MCV 91.9 93.7 89.1  PLT 299 267 279   Basic Metabolic Panel: Recent Labs  Lab  03/06/23 0330 03/06/23 0846 03/07/23 0309 03/08/23 0328 03/09/23 0855 03/11/23 0849  NA 135  --  137 137 133* 133*  K 3.9  --  3.8 3.6 3.9 3.4*  CL 103  --  104 101 98 98  CO2 20*  --  24 25 24 24   GLUCOSE 115*  --  92 102* 170* 113*  BUN 36*  --  34* 40* 39* 43*  CREATININE 2.45* 2.30* 2.69* 2.49* 2.50* 2.65*  CALCIUM 8.4*  --  8.3* 8.3* 8.2* 8.5*   GFR: Estimated Creatinine Clearance: 18.4 mL/min (A) (by C-G formula based on SCr of 2.65 mg/dL (H)). Liver Function Tests: Recent Labs  Lab 03/06/23 0330  AST 40  ALT 29  ALKPHOS 105  BILITOT 1.0  PROT 6.6  ALBUMIN 2.9*   No results for input(s): "LIPASE", "AMYLASE" in the last 168 hours. No results for input(s): "AMMONIA" in the last 168 hours. Coagulation Profile: No  results for input(s): "INR", "PROTIME" in the last 168 hours. Cardiac Enzymes: No results for input(s): "CKTOTAL", "CKMB", "CKMBINDEX", "TROPONINI" in the last 168 hours. BNP (last 3 results) No results for input(s): "PROBNP" in the last 8760 hours. HbA1C: No results for input(s): "HGBA1C" in the last 72 hours. CBG: Recent Labs  Lab 03/10/23 0554 03/10/23 1208 03/10/23 1718 03/10/23 2122 03/11/23 0611  GLUCAP 96 117* 99 104* 104*   Lipid Profile: No results for input(s): "CHOL", "HDL", "LDLCALC", "TRIG", "CHOLHDL", "LDLDIRECT" in the last 72 hours. Thyroid Function Tests: No results for input(s): "TSH", "T4TOTAL", "FREET4", "T3FREE", "THYROIDAB" in the last 72 hours. Anemia Panel: No results for input(s): "VITAMINB12", "FOLATE", "FERRITIN", "TIBC", "IRON", "RETICCTPCT" in the last 72 hours. Sepsis Labs: No results for input(s): "PROCALCITON", "LATICACIDVEN" in the last 168 hours.  Recent Results (from the past 240 hours)  Respiratory (~20 pathogens) panel by PCR     Status: None   Collection Time: 03/07/23 12:19 PM   Specimen: Nasopharyngeal Swab; Respiratory  Result Value Ref Range Status   Adenovirus NOT DETECTED NOT DETECTED Final    Coronavirus 229E NOT DETECTED NOT DETECTED Final    Comment: (NOTE) The Coronavirus on the Respiratory Panel, DOES NOT test for the novel  Coronavirus (2019 nCoV)    Coronavirus HKU1 NOT DETECTED NOT DETECTED Final   Coronavirus NL63 NOT DETECTED NOT DETECTED Final   Coronavirus OC43 NOT DETECTED NOT DETECTED Final   Metapneumovirus NOT DETECTED NOT DETECTED Final   Rhinovirus / Enterovirus NOT DETECTED NOT DETECTED Final   Influenza A NOT DETECTED NOT DETECTED Final   Influenza B NOT DETECTED NOT DETECTED Final   Parainfluenza Virus 1 NOT DETECTED NOT DETECTED Final   Parainfluenza Virus 2 NOT DETECTED NOT DETECTED Final   Parainfluenza Virus 3 NOT DETECTED NOT DETECTED Final   Parainfluenza Virus 4 NOT DETECTED NOT DETECTED Final   Respiratory Syncytial Virus NOT DETECTED NOT DETECTED Final   Bordetella pertussis NOT DETECTED NOT DETECTED Final   Bordetella Parapertussis NOT DETECTED NOT DETECTED Final   Chlamydophila pneumoniae NOT DETECTED NOT DETECTED Final   Mycoplasma pneumoniae NOT DETECTED NOT DETECTED Final    Comment: Performed at [facility], [address], [place], [place] [address]  Resp panel by RT-PCR (RSV, Flu A&B, Covid) Anterior Nasal Swab     Status: None   Collection Time: 03/07/23 12:19 PM   Specimen: Anterior Nasal Swab  Result Value Ref Range Status   SARS Coronavirus 2 by RT PCR NEGATIVE NEGATIVE Final   Influenza A by PCR NEGATIVE NEGATIVE Final   Influenza B by PCR NEGATIVE NEGATIVE Final    Comment: (NOTE) The Xpert Xpress SARS-CoV-2/FLU/RSV plus assay is intended as an aid in the diagnosis of influenza from Nasopharyngeal swab specimens and should not be used as a sole basis for treatment. Nasal washings and aspirates are unacceptable for Xpert Xpress SARS-CoV-2/FLU/RSV testing.  Fact Sheet for Patients: BloggerCourse.com  Fact Sheet for Healthcare Providers: SeriousBroker.it  This test is  not yet approved or cleared by the [place] FDA and has been authorized for detection and/or diagnosis of SARS-CoV-2 by FDA under an Emergency Use Authorization (EUA). This EUA will remain in effect (meaning this test can be used) for the duration of the COVID-19 declaration under Section 564(b)(1) of the Act, 21 U.S.C. section 360bbb-3(b)(1), unless the authorization is terminated or revoked.     Resp Syncytial Virus by PCR NEGATIVE NEGATIVE Final    Comment: (NOTE) Fact Sheet for Patients:  BloggerCourse.com  Fact Sheet for Healthcare Providers: SeriousBroker.it  This test is not yet approved or cleared by the [place] FDA and has been authorized for detection and/or diagnosis of SARS-CoV-2 by FDA under an Emergency Use Authorization (EUA). This EUA will remain in effect (meaning this test can be used) for the duration of the COVID-19 declaration under Section 564(b)(1) of the Act, 21 U.S.C. section 360bbb-3(b)(1), unless the authorization is terminated or revoked.  Performed at [facility], [address] [place], [place] [address]      Radiology Studies: No results found.   Scheduled Meds:  amiodarone  100 mg Oral Daily   amLODipine  5 mg Oral Daily   aspirin EC  81 mg Oral Daily   atorvastatin  20 mg Oral Daily   enoxaparin (LOVENOX) injection  30 mg Subcutaneous Daily   escitalopram  10 mg Oral Daily   ezetimibe  10 mg Oral Daily   fluticasone furoate-vilanterol  1 puff Inhalation Daily   insulin aspart  0-5 Units Subcutaneous QHS   insulin aspart  0-9 Units Subcutaneous TID WC   memantine  5 mg Oral QHS   sodium chloride flush  3 mL Intravenous Q12H   Continuous Infusions:   LOS: 5 days   Hughie Closs, MD [facility]  03/11/2023, 10:37 AM   *Please note that this is a verbal dictation therefore any spelling or grammatical errors are due to the "Dragon Medical One" system  interpretation.  Please page via Amion and do not message via secure chat for urgent patient care matters. Secure chat can be used for non urgent patient care matters.  How to contact the [facility] Attending or Consulting provider 7A - 7P or covering provider during after hours 7P -7A, for this patient?  Check the care team in [facility] and look for a) attending/consulting TRH provider listed and b) the [facility] team listed. Page or secure chat 7A-7P. Log into [email or web address] and use 's universal password to access. If you do not have the password, please contact the hospital operator. Locate the [facility] provider you are looking for under [facility] and page to a number that you can be directly reached. If you still have difficulty reaching the provider, please page the [facility] (Director on Call) for the Hospitalists listed on amion for assistance.

## 2023-03-11 NOTE — TOC Progression Note (Signed)
Transition of Care [facility]) - Progression Note    Patient Details  Name: [name] MRN: [id number] Date of Birth: [birth year]  Transition of Care [facility]) CM/SW Contact  Eduard Roux, [place] Phone Number: 03/11/2023, 3:59 PM  Clinical Narrative:     Received call from patient's spouse- she expressed concerns about the SNF bed offers. She states she toured a facility and was not pleased. She believes he has declined and wants him to return home in his familiar environment. She has declined SNF and want the patient to go home w/ PC & HH  CSW updated CM - spouse want to take him home.  Antony Blackbird, MSW, LCSW Clinical Social Worker    Expected Discharge Plan: Skilled Nursing Facility Barriers to Discharge: SNF Pending bed offer, Insurance Authorization  Expected Discharge Plan and Services In-house Referral: Clinical Social Work Discharge Planning Services: CM Consult Post Acute Care Choice: Durable Medical Equipment, Home Health, Skilled Nursing Facility Living arrangements for the past 2 months: Single Family Home Expected Discharge Date: 03/08/23               DME Arranged: Hospital bed         HH Arranged: RN, Disease Management, PT, OT HH Agency: [facility]         Social Determinants of Health (SDOH) Interventions SDOH Screenings   Food Insecurity: No Food Insecurity (03/06/2023)  Housing: Unknown (03/06/2023)  Transportation Needs: No Transportation Needs (03/06/2023)  Utilities: Not At Risk (03/06/2023)  Alcohol Screen: Low Risk  (04/07/2018)  Depression (PHQ2-9): Medium Risk (01/24/2023)  Financial Resource Strain: Low Risk  (02/09/2022)  Social Connections: Unknown (03/06/2023)  Tobacco Use: Low Risk  (01/24/2023)    Readmission Risk Interventions     No data to display

## 2023-03-11 NOTE — TOC Progression Note (Signed)
Transition of Care [facility]) - Progression Note    Patient Details  Name: [name] MRN: [id number] Date of Birth: Jun 21, 1933  [facility]) CM/SW Contact  Eduard Roux, [place] Phone Number: 03/11/2023, 12:09 PM  Clinical Narrative:     Received pasrr# [id number]  Expected Discharge Plan: Skilled Nursing Facility Barriers to Discharge: SNF Pending bed offer, Insurance Authorization  Expected Discharge Plan and Services In-house Referral: Clinical Social Work Discharge Planning Services: CM Consult Post Acute Care Choice: Durable Medical Equipment, Home Health, Skilled Nursing Facility Living arrangements for the past 2 months: Single Family Home Expected Discharge Date: 03/08/23               DME Arranged: Hospital bed         HH Arranged: RN, Disease Management, PT, OT HH Agency: [facility]         Social Determinants of Health (SDOH) Interventions SDOH Screenings   Food Insecurity: No Food Insecurity (03/06/2023)  Housing: Unknown (03/06/2023)  Transportation Needs: No Transportation Needs (03/06/2023)  Utilities: Not At Risk (03/06/2023)  Alcohol Screen: Low Risk  (04/07/2018)  Depression (PHQ2-9): Medium Risk (01/24/2023)  Financial Resource Strain: Low Risk  (02/09/2022)  Social Connections: Unknown (03/06/2023)  Tobacco Use: Low Risk  (01/24/2023)    Readmission Risk Interventions     No data to display

## 2023-03-11 NOTE — Plan of Care (Signed)
  Problem: Education: Goal: Ability to describe self-care measures that may prevent or decrease complications (Diabetes Survival Skills Education) will improve Outcome: Not Progressing   Problem: Coping: Goal: Ability to adjust to condition or change in health will improve Outcome: Progressing   Problem: Fluid Volume: Goal: Ability to maintain a balanced intake and output will improve Outcome: Progressing   Problem: Health Behavior/Discharge Planning: Goal: Ability to identify and utilize available resources and services will improve Outcome: Progressing

## 2023-03-11 NOTE — TOC CM/SW Note (Signed)
   Durable Medical Equipment (From admission, onward)        Start     Ordered  03/08/23 1328  For home use only DME Hospital bed  Once      Comments: Therapeutic mattress Question Answer Comment Length of Need Lifetime  Patient has (list medical condition): CHF  The above medical condition requires: Patient requires the ability to reposition frequently  Bed type Semi-electric  Support Surface: Gel Overlay    03/08/23 1328

## 2023-03-11 NOTE — Progress Notes (Signed)
Physical Therapy Treatment Patient Details Name: [name] MRN: [id number] DOB: 14-Feb-1933 Today's Date: 03/11/2023   History of Present Illness 89 y.o. male  adm 1/29 with SOB, orthopnea, edema/CHF exacerbation  PMHx: CAD, DM, dCHF, HLD, PAF, dementia, CKD, anemia of chronic disease    PT Comments  Pt slightly agitated on arrival needing redirection, encouragement and assist to initiate movement to EOB. Pt with progressive gait distance with use of rollator vs RW and encouragement of wife. Pt incontinent of B/B and assist for pericare in standing with ultimate pivot to [facility] end of session. Pt with baseline cognitive deficits with increased assist for transfers compared to baseline. Will continue to follow with OOB daily encouraged.      If plan is discharge home, recommend the following: A little help with walking and/or transfers;A little help with bathing/dressing/bathroom;Assistance with cooking/housework;Direct supervision/assist for financial management;Supervision due to cognitive status;Assist for transportation;Help with stairs or ramp for entrance;Direct supervision/assist for medications management   Can travel by private vehicle     Yes  Equipment Recommendations  Hospital bed    Recommendations for Other Services       Precautions / Restrictions Precautions Precautions: Fall Precaution Comments: multiple falls in the last 2 weeks, incontinent B/B Restrictions Weight Bearing Restrictions Per Provider Order: No     Mobility  Bed Mobility Overal bed mobility: Needs Assistance Bed Mobility: Supine to Sit     Supine to sit: Mod assist, HOB elevated     General bed mobility comments: HOB 30 degrees, mod assist to pivot legs to EOB and elevate trunk, min to scoot to edge with max cues    Transfers Overall transfer level: Needs assistance   Transfers: Sit to/from Stand Sit to Stand: Min assist, +2 safety/equipment     Squat pivot transfers: Min assist, +2  safety/equipment     General transfer comment: min +2 safety to rise from bed to rollator and from recliner to perform squat pivot from recliner to [facility] with max multimodal cue and assist to direct hand and pelvis during pivot    Ambulation/Gait Ambulation/Gait assistance: Min assist Gait Distance (Feet): 175 Feet Assistive device: Rollator (4 wheels) Gait Pattern/deviations: Shuffle, Trunk flexed, Decreased stride length, Step-through pattern   Gait velocity interpretation: <1.31 ft/sec, indicative of household ambulator   General Gait Details: pt with kyphotic posture, too posterior to rollator with mod cues for direction, proximity to rollator and safety   Stairs             Wheelchair Mobility     Tilt Bed    Modified Rankin (Stroke Patients Only)       Balance Overall balance assessment: Needs assistance, History of Falls Sitting-balance support: Feet supported, Single extremity supported Sitting balance-Leahy Scale: Fair     Standing balance support: During functional activity, Bilateral upper extremity supported, Reliant on assistive device for balance Standing balance-Leahy Scale: Poor Standing balance comment: rollator and CGA for standing balance                            Cognition Arousal: Alert Behavior During Therapy: WFL for tasks assessed/performed Overall Cognitive Status: History of cognitive impairments - at baseline                                 General Comments: confused and slightly agitated needing frequent redirection to task and to participate,  pt unaware of BM        Exercises      General Comments        Pertinent Vitals/Pain Pain Assessment Pain Assessment: No/denies pain    Home Living                          Prior Function            PT Goals (current goals can now be found in the care plan section) Progress towards PT goals: Progressing toward goals    Frequency    Min  1X/week      PT Plan      Co-evaluation              AM-PAC PT "6 Clicks" Mobility   Outcome Measure  Help needed turning from your back to your side while in a flat bed without using bedrails?: A Little Help needed moving from lying on your back to sitting on the side of a flat bed without using bedrails?: A Lot Help needed moving to and from a bed to a chair (including a wheelchair)?: A Lot Help needed standing up from a chair using your arms (e.g., wheelchair or bedside chair)?: A Lot Help needed to walk in hospital room?: A Little Help needed climbing 3-5 steps with a railing? : Total 6 Click Score: 13    End of Session Equipment Utilized During Treatment: Gait belt Activity Tolerance: Patient tolerated treatment well Patient left: in chair;with chair alarm set;with call bell/phone within reach;with family/visitor present Nurse Communication: Mobility status PT Visit Diagnosis: Unsteadiness on feet (R26.81);Muscle weakness (generalized) (M62.81)     Time: 1308-6578 PT Time Calculation (min) (ACUTE ONLY): 27 min  Charges:    $Gait Training: 8-22 mins $Therapeutic Activity: 8-22 mins PT General Charges $$ ACUTE PT VISIT: 1 Visit                     Merryl Hacker, PT Acute Rehabilitation Services Office: [phone]    [name] 03/11/2023, 11:42 AM

## 2023-03-11 NOTE — Progress Notes (Signed)
Chronic right-sided ear pain secondary to earwax RN reported that patient is complaining about right-sided ear pain.  Patient reported that he has history of trauma long time ago since then he has chronic right-sided ear pain.  At the bedside otoscopic exam showed that bilateral ears filled with wax and there is no sign of erythema or infection. - Starting Debrox eardrop 5 drops to apply bilateral ear twice daily which will help to melting the ear max.   Tereasa Coop, MD [facility] 03/11/2023, 10:54 PM

## 2023-03-11 NOTE — TOC Progression Note (Signed)
Transition of Care [facility]) - Progression Note    Patient Details  Name: MORRIS LONGENECKER MRN: [id number] Date of Birth: 10-26-1933  Transition of Care [facility]) CM/SW Contact  Eduard Roux, [place] Phone Number: 03/11/2023, 12:24 PM  Clinical Narrative:     Sent request to review pending SNF referral- [facility], [facility], [facility]- waiting on response   Antony Blackbird, MSW, LCSW Clinical Social Worker    Expected Discharge Plan: Skilled Nursing Facility Barriers to Discharge: SNF Pending bed offer, Insurance Authorization  Expected Discharge Plan and Services In-house Referral: Clinical Social Work Discharge Planning Services: [employer] Consult Post Acute Care Choice: Horticulturist, commercial, Home Health, Skilled Nursing Facility Living arrangements for the past 2 months: Single Family Home Expected Discharge Date: 03/08/23               DME Arranged: Hospital bed         HH Arranged: RN, Disease Management, PT, OT HH Agency: [facility]         Social Determinants of Health (SDOH) Interventions SDOH Screenings   Food Insecurity: No Food Insecurity (03/06/2023)  Housing: Unknown (03/06/2023)  Transportation Needs: No Transportation Needs (03/06/2023)  Utilities: Not At Risk (03/06/2023)  Alcohol Screen: Low Risk  (04/07/2018)  Depression (PHQ2-9): Medium Risk (01/24/2023)  Financial Resource Strain: Low Risk  (02/09/2022)  Social Connections: Unknown (03/06/2023)  Tobacco Use: Low Risk  (01/24/2023)    Readmission Risk Interventions     No data to display

## 2023-03-11 NOTE — TOC Progression Note (Signed)
Transition of Care [facility]) - Progression Note    Patient Details  Name: THURL BOEN MRN: [id number] Date of Birth: 08-08-1933  Transition of Care [facility]) CM/SW Contact  Eduard Roux, [place] Phone Number: 03/11/2023, 12:59 PM  Clinical Narrative:     CSW met with patient's spouse- provided bed offers- she states she will review offers and call  CSW back w/ SNF choice.   Antony Blackbird, MSW, LCSW Clinical Social Worker    Expected Discharge Plan: Skilled Nursing Facility Barriers to Discharge: SNF Pending bed offer, Insurance Authorization  Expected Discharge Plan and Services In-house Referral: Clinical Social Work Discharge Planning Services: CM Consult Post Acute Care Choice: Durable Medical Equipment, Home Health, Skilled Nursing Facility Living arrangements for the past 2 months: Single Family Home Expected Discharge Date: 03/08/23               DME Arranged: Hospital bed         HH Arranged: RN, Disease Management, PT, OT HH Agency: [facility]         Social Determinants of Health (SDOH) Interventions SDOH Screenings   Food Insecurity: No Food Insecurity (03/06/2023)  Housing: Unknown (03/06/2023)  Transportation Needs: No Transportation Needs (03/06/2023)  Utilities: Not At Risk (03/06/2023)  Alcohol Screen: Low Risk  (04/07/2018)  Depression (PHQ2-9): Medium Risk (01/24/2023)  Financial Resource Strain: Low Risk  (02/09/2022)  Social Connections: Unknown (03/06/2023)  Tobacco Use: Low Risk  (01/24/2023)    Readmission Risk Interventions     No data to display

## 2023-03-11 NOTE — Plan of Care (Signed)
   Problem: Coping: Goal: Ability to adjust to condition or change in health will improve Outcome: Progressing

## 2023-03-12 DIAGNOSIS — I5033 Acute on chronic diastolic (congestive) heart failure: Secondary | ICD-10-CM | POA: Diagnosis not present

## 2023-03-12 LAB — GLUCOSE, CAPILLARY
Glucose-Capillary: 90 mg/dL (ref 70–99)
Glucose-Capillary: 143 mg/dL — ABNORMAL HIGH (ref 70–99)

## 2023-03-12 MED ORDER — POTASSIUM CHLORIDE CRYS ER 20 MEQ PO TBCR
40.0000 meq | EXTENDED_RELEASE_TABLET | ORAL | Status: DC
  Administered 2023-03-12: 40 meq via ORAL
  Filled 2023-03-12: qty 2

## 2023-03-12 NOTE — Progress Notes (Signed)
 Discharge instructions reviewed with pt and his wife.  Copy of instructions given to pt. Pt/wife informed scripts were sent to his pharmacy for pick up. Pt getting dressed at this time.  Pt will be d/c'd via wheelchair with belongings, with his wife and will be escorted by staff to assist into the car.   Maevyn Riordan,RN SWOT

## 2023-03-12 NOTE — TOC CM/SW Note (Signed)
   Durable Medical Equipment (From admission, onward)        Start     Ordered  03/08/23 1328  For home use only DME Hospital bed  Once      Comments: Therapeutic mattress Question Answer Comment Length of Need Lifetime  Patient has (list medical condition): CHF  The above medical condition requires: Patient requires the ability to reposition frequently  Bed type Semi-electric  Support Surface: Gel Overlay    03/08/23 1328

## 2023-03-12 NOTE — Care Management Important Message (Signed)
Important Message  Patient Details  Name: [name] MRN: [id number] Date of Birth: [birth year]   Important Message Given:  Yes - Medicare IM     Renie Ora 03/12/2023, 11:05 AM

## 2023-03-12 NOTE — Progress Notes (Signed)
 [facility] [phone] [facility] Liaison Note  This is a current patient with [facility] outpatient palliative program.   We will follow for discharge disposition.  Please call with any hospice or palliative care related questions.  Thank you, Randine Nail, BSN, [facility] [phone]

## 2023-03-12 NOTE — TOC Transition Note (Signed)
 Transition of Care (TOC) - Discharge Note Rayfield Gobble RN, BSN Transitions of Care Unit 4E- RN Case Manager See Treatment Team for direct phone #   Patient Details  Name: [name] MRN: [id number] Date of Birth: 1933/02/11  Transition of Care [facility]) CM/SW Contact:  Gobble Rayfield Hurst, RN Phone Number: 03/12/2023, 10:08 AM   Clinical Narrative:    Pt stable for transition home today, CM has spoken with wife this am regarding delivery of hospital bed- per wife she is still working on making space for bed (estimate given to wife for size of bed)- wife voiced pt was sleeping in recliner PTA and can do that on return home until bed delivered- she is hopeful to be ready to have bed delivered tomorrow. [employer] will call her regarding delivery and wife can work out with them best day/time for delivery of hospital bed when she is ready for it.   As per previous conversation [facility] referral called to [facility] for HHRN/PT/OT needs- [facility] liaison accepted referral and will f/u for start of care.  [facility] has also spoken with wife and will continue to follow for Home Palliative needs.  Family to transport home   Final next level of care: Home w Home Health Services Barriers to Discharge: Barriers Resolved   Patient Goals and CMS Choice Patient states their goals for this hospitalization and ongoing recovery are:: return home with [facility] CMS Medicare.gov Compare Post Acute Care list provided to:: Patient Represenative (must comment) Choice offered to / list presented to : Spouse      Discharge Placement               Home w/ [facility]        Discharge Plan and Services Additional resources added to the After Visit Summary for   In-house Referral: Clinical Social Work Discharge Planning Services: CM Consult Post Acute Care Choice: Durable Medical Equipment, Home Health, Skilled Nursing Facility          DME Arranged: Hospital bed DME Agency: [employer] Date DME Agency Contacted:  03/12/23 Time DME Agency Contacted: 1630 Representative spoke with at DME Agency: Darlyn HH Arranged: RN, Disease Management, PT, OT HH Agency: [facility] Date [facility] Agency Contacted: 03/12/23 Time HH Agency Contacted: 1008 Representative spoke with at [facility] Agency: Darleene  Social Drivers of Health (SDOH) Interventions SDOH Screenings   Food Insecurity: No Food Insecurity (03/06/2023)  Housing: Unknown (03/06/2023)  Transportation Needs: No Transportation Needs (03/06/2023)  Utilities: Not At Risk (03/06/2023)  Alcohol Screen: Low Risk  (04/07/2018)  Depression (PHQ2-9): Medium Risk (01/24/2023)  Financial Resource Strain: Low Risk  (02/09/2022)  Social Connections: Unknown (03/06/2023)  Tobacco Use: Low Risk  (01/24/2023)     Readmission Risk Interventions    03/12/2023   10:08 AM  Readmission Risk Prevention Plan  Transportation Screening Complete  HRI or Home Care Consult Complete  Social Work Consult for Recovery Care Planning/Counseling Complete  Palliative Care Screening Complete  Medication Review [facility]) Complete

## 2023-03-13 ENCOUNTER — Telehealth: Payer: Self-pay | Admitting: *Deleted

## 2023-03-13 NOTE — Transitions of Care (Post Inpatient/ED Visit) (Signed)
 03/13/2023  Name: [name] MRN: [id number] DOB: 12-28-1933  Today's TOC FU Call Status: Today's TOC FU Call Status:: Successful TOC FU Call Completed TOC FU Call Complete Date: 03/13/23 Patient's Name and Date of Birth confirmed.  Transition Care Management Follow-up Telephone Call Date of Discharge: 03/12/23 Discharge Facility: [name]) Type of Discharge: Inpatient Admission Primary Inpatient Discharge Diagnosis:: Acute on chronic congestive heart failure How have you been since you were released from the hospital?:  (eating well, ambulating well w/ walker, will try to resume daily wts when pt is stronger) Any questions or concerns?: No  Items Reviewed: Did you receive and understand the discharge instructions provided?: Yes Medications obtained,verified, and reconciled?: Yes (Medications Reviewed) Any new allergies since your discharge?: No Dietary orders reviewed?: Yes Type of Diet Ordered:: low sodium heart healthy Do you have support at home?: Yes People in Home: spouse Name of Support/Comfort Primary Source: [name] Spouse agreeable to enrollment in TOC 30 day program   Goals Addressed             This Visit's Progress    Transition of Care/ pt will have no readmissions within 30 days       Current Barriers:  Home [name] services [name] home [name] has not contacted pt to schedule services Knowledge Deficits related to plan of care for management of CHF - pt will start weighing when he is more steady on his feet, per spouse [name], pt has dementia, is using walker, has hospital bed, states will be working with palliative care  RNCM Clinical Goal(s):  Patient will work with the Care Management team over the next 30 days to address Transition of Care Barriers: Home [name] services verbalize understanding of plan for management of CHF as evidenced by spouse report, review of EMR and  through collaboration with RN Care manager, provider, and care  team.   Interventions: Evaluation of current treatment plan related to  self management and patient's adherence to plan as established by provider  Heart Failure Interventions:  (Status:  New goal. and Goal on track:  Yes.) Short Term Goal Basic overview and discussion of pathophysiology of Heart Failure reviewed Provided education on low sodium diet Assessed need for readable accurate scales in home Provided education about placing scale on hard, flat surface Advised patient to weigh each morning after emptying bladder Discussed importance of daily weight and advised patient to weigh and record daily Reviewed role of diuretics in prevention of fluid overload and management of heart failure; Discussed the importance of keeping all appointments with provider Assessed social determinant of [name] barriers  Reviewed HF action plan, reportable signs/ symptoms Provided RN Care Manager contact # Telephone call to [name], spoke with [name] who reports they received referral after hours last night, in process and will call pt/ spouse to let them know what day/ time first visit will be  Patient Goals/Self-Care Activities: Participate in Transition of Care Program/Attend [facility] scheduled calls Notify RN Care Manager of TOC call rescheduling needs Take all medications as prescribed Attend all scheduled provider appointments Call pharmacy for medication refills 3-7 days in advance of running out of medications Call provider office for new concerns or questions  call office if I gain more than 2 pounds in one day or 5 pounds in one week keep legs up while sitting use salt in moderation watch for swelling in feet, ankles and legs every day weigh myself daily develop a rescue plan follow rescue plan if symptoms  flare-up eat more whole grains, fruits and vegetables, lean meats and healthy fats dress right for the weather, hot or cold [name] will be contacting you   [phone]  Follow  Up Plan:  Telephone follow up appointment with care management team member scheduled for:  03/20/23 @ 1 pm           Medications Reviewed Today: Medications Reviewed Today     Reviewed by [name] Mliss LABOR, RN (Registered Nurse) on 03/13/23 at 1129  Med List Status: <None>   Medication Order Taking? Sig Documenting Provider Last Dose Status Informant  acetaminophen  (TYLENOL ) 500 MG tablet [id number] Yes Take 1,000 mg by mouth as needed for headache or mild pain (pain score 1-3). [provider] Taking Active Self, Spouse/Significant Other, Pharmacy Records  albuterol  (PROAIR  HFA) 108 (90 Base) MCG/ACT inhaler [id number] Yes INHALE 2 PUFFS INTO THE LUNGS EVERY 6 HOURS AS NEEDED FOR WHEEZING OR SHORTNESS OF BREATH  Patient taking differently: Inhale 1-2 puffs into the lungs every 6 (six) hours as needed for shortness of breath. INHALE 2 PUFFS INTO THE LUNGS EVERY 6 HOURS AS NEEDED FOR WHEEZING OR SHORTNESS OF BREATH   Angiulli, Toribio PARAS, PA-C Taking Active Self, Spouse/Significant Other, Pharmacy Records  amiodarone  (PACERONE ) 200 MG tablet [id number] Yes Take 0.5 tablets (100 mg total) by mouth daily. Wonda Sharper, MD Taking Active Self, Spouse/Significant Other, Pharmacy Records  amLODipine  (NORVASC ) 5 MG tablet [id number] Yes Take 1 tablet (5 mg total) by mouth daily.  Patient taking differently: Take 5 mg by mouth at bedtime.   [name], MD Taking Active Self, Spouse/Significant Other, Pharmacy Records  aspirin  EC 81 MG tablet [id number] Yes Take 1 tablet (81 mg total) by mouth daily. Swallow whole. Gonfa, Taye T, MD Taking Active Self, Spouse/Significant Other, Pharmacy Records           Med Note ROSELEE TEENA BRAVO   Sat May 05, 2022  7:11 PM)    atorvastatin  (LIPITOR) 20 MG tablet [id number] Yes TAKE 1 TABLET BY MOUTH EVERY DAY [name], MD Taking Active   empagliflozin  (JARDIANCE ) 10 MG TABS tablet [id number] Yes Take 1 tablet (10 mg total) by mouth daily before  breakfast. [name], MD Taking Active Self, Spouse/Significant Other, Pharmacy Records  escitalopram  (LEXAPRO ) 10 MG tablet [id number] Yes Take 1 tablet (10 mg total) by mouth daily. [name], MD Taking Active Self, Spouse/Significant Other, Pharmacy Records  ezetimibe  (ZETIA ) 10 MG tablet [id number] Yes TAKE 1 TABLET BY MOUTH EVERY DAY Wonda Sharper, MD Taking Active Self, Spouse/Significant Other, Pharmacy Records  fluticasone  furoate-vilanterol (BREO ELLIPTA ) 200-25 MCG/ACT AEPB [id number] Yes Inhale 1 puff into the lungs daily. [name], MD Taking Active Self, Spouse/Significant Other, Pharmacy Records  furosemide  (LASIX ) 20 MG tablet [id number] Yes Take 1 tablet (20 mg total) by mouth daily. Vernon Ranks, MD Taking Active   memantine  (NAMENDA ) 5 MG tablet [id number] Yes TAKE 1 TABLET BY MOUTH EVERYDAY AT BEDTIME Wertman, Sara E, PA-C Taking Active   Multiple Vitamin (MULTIVITAMIN WITH MINERALS) TABS tablet [id number] Yes Take 1 tablet by mouth daily. [provider] Taking Active Self, Spouse/Significant Other, Pharmacy Records            Home Care and Equipment/Supplies: Were Home [name] Services Ordered?: Yes Name of Home [name] Agency:: [name] Has Agency set up a time to come to your home?: No (t/c to [place] spoke w/ [name] who confirms they received orders after hours last night,  in  process of verifiying insurance, etc, will give pt a call to scheduled home visit, spouse VU, she has contact # on hand if she needs to call them) EMR reviewed for Home [name] Orders: Orders present/patient has not received call (refer to CM for follow-up) Any new equipment or medical supplies ordered?: Yes Name of Medical supply agency?: [employer]   hospital bed has been delivered Were you able to get the equipment/medical supplies?: Yes Do you have any questions related to the use of the equipment/supplies?: No  Functional Questionnaire: Do you need assistance with  bathing/showering or dressing?: Yes (spouse assists) Do you need assistance with meal preparation?: Yes (spouse assists) Do you need assistance with eating?: No Do you have difficulty maintaining continence: No Do you need assistance with getting out of bed/getting out of a chair/moving?: Yes (uses rolling walker) Do you have difficulty managing or taking your medications?: Yes  Follow up appointments reviewed: PCP Follow-up appointment confirmed?: No (spouse is calling today to make f/u appt, spouse prefers to call) MD Provider Line [phone] Given: No Specialist Hospital Follow-up appointment confirmed?: Yes Date of Specialist follow-up appointment?: 03/15/23 Follow-Up Specialty Provider:: Harriette Reef cardiologist   @ 1005 am Do you need transportation to your follow-up appointment?: No Do you understand care options if your condition(s) worsen?: Yes-patient verbalized understanding  SDOH Interventions Today    Flowsheet Row Most Recent Value  SDOH Interventions   Food Insecurity Interventions Intervention Not Indicated  Housing Interventions Intervention Not Indicated  Transportation Interventions Intervention Not Indicated  Utilities Interventions Intervention Not Indicated       Mliss Creed [name], BSN RN Care Manager/ Transition of Care [place]/ [facility] Population [name] [phone]

## 2023-03-15 ENCOUNTER — Encounter: Payer: Self-pay | Admitting: Nurse Practitioner

## 2023-03-15 ENCOUNTER — Ambulatory Visit: Payer: PPO | Admitting: Physician Assistant

## 2023-03-15 ENCOUNTER — Ambulatory Visit: Payer: HMO | Attending: Nurse Practitioner | Admitting: Nurse Practitioner

## 2023-03-15 ENCOUNTER — Telehealth: Payer: Self-pay

## 2023-03-15 VITALS — BP 132/60 | HR 68 | Ht 67.0 in | Wt 167.0 lb

## 2023-03-15 DIAGNOSIS — N183 Chronic kidney disease, stage 3 unspecified: Secondary | ICD-10-CM

## 2023-03-15 DIAGNOSIS — I4819 Other persistent atrial fibrillation: Secondary | ICD-10-CM

## 2023-03-15 DIAGNOSIS — I5032 Chronic diastolic (congestive) heart failure: Secondary | ICD-10-CM | POA: Diagnosis not present

## 2023-03-15 DIAGNOSIS — I10 Essential (primary) hypertension: Secondary | ICD-10-CM | POA: Diagnosis not present

## 2023-03-15 DIAGNOSIS — I2510 Atherosclerotic heart disease of native coronary artery without angina pectoris: Secondary | ICD-10-CM | POA: Diagnosis not present

## 2023-03-15 MED ORDER — FUROSEMIDE 20 MG PO TABS: ORAL_TABLET | ORAL | 3 refills | Status: DC

## 2023-03-15 MED ORDER — POTASSIUM CHLORIDE CRYS ER 10 MEQ PO TBCR: EXTENDED_RELEASE_TABLET | ORAL | 2 refills | Status: DC

## 2023-03-15 NOTE — Progress Notes (Signed)
 Cardiology Office Note    Patient Name: [name] Date of Encounter: 03/15/2023  Primary Care Provider:  [name], MD Primary Cardiologist:  Ozell Fell, MD Primary Electrophysiologist: None   Past Medical History    Past Medical History:  Diagnosis Date   Acute blood loss anemia    Acute upper respiratory infections of unspecified site 11/04/2012   AKI (acute kidney injury) (HCC)    Alzheimer disease (HCC)    Benign prostatic hyperplasia    CAD (coronary artery disease)    s/p cypher DES to pLAD 6/08; normal LVF;  ETT-Myoview 2009: no ischemia    Clavicle fracture 08/10/2019   Closed displaced fracture of phalanx of left thumb, sequela 08/10/2019   Coronary atherosclerosis of native coronary artery 03/08/2008   Dizziness 03/08/2020   Eosinophilia    Essential hypertension    Fracture of multiple ribs with pain 08/10/2019   Hematoma 11/04/2012   History of multiple falls    History of SAH (subarachnoid hemorrhage) 07/14/2019   Hyperlipidemia type IIB / III 03/08/2008   Itching of ear 07/14/2021   Major neurocognitive disorder due to possible Alzheimer's disease, without behavioral disturbance (HCC) 02/29/2020   MI (myocardial infarction) (HCC)    Moderate persistent asthma    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 male with a PMH of CAD s/p PCI to LAD in 2008, HFpEF, HTN, HLD carotid artery stenosis, CKD stage III, traumatic SAH 07/2019, persistent AF, Alzheimer's dementia who presents today for posthospital  follow-up.  [name] has been followed for CAD since 2015 and was previously seen by Dr. Edith.  He has a history of LAD stent in 2008 with moderate diffuse residual disease treated with medical therapy.  He completed a GXT in 2014 that was normal and was also treated for recurrent left pleural effusion by thoracentesis in 09/2019 at 11/2019 with negative cytology for malignancy.  He was admitted on 04/2022 with complaint of chest pain and hsTnT mildly elevated and flat (30 --> 32).  He was diagnosed with paroxysmal AF he was started on Cardizem  and converted to sinus rhythm.  He was discharged on p.o. amiodarone  and not started on [facility] due to comorbidities and history of subarachnoid hemorrhage.  He was readmitted on 05/05/2022 with complaint of dyspnea and atypical chest pain.  2D echo in 01/2022 showed EF of 55 to 60% with no RWMA and moderate LVH with grade 1 DD and moderate pericardial effusion.  He was seen in follow-up on 01/07/2023 by Dr. Fell and amiodarone  was decreased to 100 mg to minimize drug toxicity.  There were no further medication changes made at that time.  He was admitted on 03/06/2023 with complaint of shortness of breath.  He reported waking up with dizziness and shortness of breath.  He was advised to stop his diuretic due to worsening kidney function and noticed increased swelling in his lower extremities.  He also endorsed some occasional chest pain.  He was admitted for hypoxic  respiratory failure and CHF exacerbation.  Patient developed fever of 100.7 after admission but remained afebrile with negative respiratory panel. 2D echo completed 02/2023 with mildly decreased EF of 45 to 50% and grade 3 DD with dilated LA, trivial MVR and no pericardial effusion He was started on Lasix  drip with significant output and improvement to symptoms.  He was discharged and restarted on Lasix  20 mg once daily per Dr. Wonda.  He was discharged to an SNF and presents today for posthospital follow-up.  Mr.  [name] presents today with his wife for posthospital follow-up.  During today's visit he reports recent episodes of shortness of breath and fluid retention. The patient's caregiver reports that the patient has been sleeping in a recliner to alleviate symptoms of shortness of breath, which are particularly pronounced in the morning. The patient's weight has increased, suggesting fluid retention. The patient was recently hospitalized due to fluid accumulation and shortness of breath after a reduction in diuretic medication. The patient's current medication regimen includes Lasix , which is being adjusted to manage fluid balance and kidney function. The patient's caregiver also reports that the patient has been experiencing increased thirst at night.  Patient denies chest pain, palpitations,  nausea, vomiting, dizziness, syncope, edema, weight gain, or early satiety.   Review of Systems  Please see the history of present illness.    All other systems reviewed and are otherwise negative except as noted above.  Physical Exam    Wt Readings from Last 3 Encounters:  03/15/23 167 lb (75.8 kg)  03/10/23 158 lb 8.2 oz (71.9 kg)  01/24/23 175 lb (79.4 kg)   VS: Vitals:   03/15/23 1040  BP: 132/60  Pulse: 68  SpO2: 97%  ,Body mass index is 26.16 kg/m. GEN: Well nourished, well developed in no acute distress Neck: No JVD; No carotid bruits Pulmonary: Clear to auscultation without rales, wheezing or rhonchi  Cardiovascular: Normal rate. Regular rhythm. Normal S1. Normal S2.   Murmurs: There is no murmur.  ABDOMEN: Soft, non-tender, non-distended EXTREMITIES: Trace lower extremity edema  EKG/LABS/ Recent Cardiac Studies   ECG personally reviewed by me today -none completed today  Risk Assessment/Calculations:    CHA2DS2-VASc Score = 5   This indicates a 7.2% annual risk of stroke. The patient's score is based upon: CHF History: 1 HTN History: 1 Diabetes History: 0 Stroke History:  0 Vascular Disease History: 1 Age Score: 2 Gender Score: 0         Lab Results  Component Value Date   WBC 7.2 03/08/2023   HGB 9.7 (L) 03/08/2023   HCT 29.5 (L) 03/08/2023   MCV 89.1 03/08/2023   PLT 279 03/08/2023   Lab Results  Component Value Date   CREATININE 2.65 (H) 03/11/2023   BUN 43 (H) 03/11/2023   NA 133 (L) 03/11/2023   K 3.4 (L) 03/11/2023   CL 98 03/11/2023   CO2 24 03/11/2023   Lab Results  Component Value Date   CHOL 110 04/28/2022   HDL 50 04/28/2022   LDLCALC 52 04/28/2022   TRIG 39 04/28/2022   CHOLHDL 2.2 04/28/2022    Lab Results  Component Value Date   HGBA1C 6.2 (H) 04/28/2022   Assessment & Plan    1.  HFpEF: -2D echo completed 02/2023 with mildly decreased EF of 45 to 20% and grade 3 DD with dilated LA, trivial MVR and no pericardial effusion -Today patient is euvolemic on exam with trace lower extremity edema present. -Check labs today including  kidney function and potassium. -Start Lasix  20mg  every other day for one week, then as needed. -Check weight daily to monitor fluid status. -Consider low salt diet to help manage fluid volume. -Provide potassium tablets for potential use if levels are low. -Check labs again in one week after starting diuretic. -Follow-up in one month.  2.  Persistent AF: -Patient is currently rate controlled at 60 bpm -Patient is not on long-term AC due to history of subarachnoid hemorrhage -Continue amiodarone  100 mg daily -Patient's CHA2DS2-VASc score is 5  3.  CAD: -s/p PCI to LAD in 2008 with LAD ischemic evaluation completed by GXT in 2014 -Today patient reports no chest pain or angina. -Continue current GDMT ASA 81 mg, Lipitor 20 mg, ezetimibe  10 mg and Jardiance  10 mg  4.  Essential hypertension: -Patient's blood pressure today was controlled at 132/60 -Continue Norvasc  5 mg daily  5.  CKD stage III: -Patient's last creatinine was 2.6 -We will recheck BMET today  Disposition: Follow-up with  Ozell Fell, MD or APP in 1 month    Signed, Wyn Raddle, Jackee Shove, NP 03/15/2023, 11:06 AM [facility]

## 2023-03-15 NOTE — Telephone Encounter (Signed)
 Copied from CRM [id number]. Topic: Clinical - Home Health Verbal Orders >> Mar 15, 2023  2:59 PM Brynn Caras wrote: Caller/Agency: Lennart Quitter - Nurse PJ  Callback Number: [phone] Service Requested: Hospice Referral Order from Dr. Cheril Cork

## 2023-03-15 NOTE — Patient Instructions (Addendum)
 Medication Instructions:  Your physician has recommended you make the following change in your medication:  1) CHANGE furosemide  (Lasix ) to 20 mg every other day for 1 week then take once daily AS NEEDED for weight gain of 2 lbs or more overnight (24 hours) 2) START potassium chloride  10 meq every other day then as needed (take with Lasix )  *If you need a refill on your cardiac medications before your next appointment, please call your pharmacy*  Lab Work: TODAY (go to 1st floor, [address]): BMET In 1 week at [facility]: BMET If you have labs (blood work) drawn today and your tests are completely normal, you will receive your results only by: MyChart Message (if you have MyChart) OR A paper copy in the mail If you have any lab test that is abnormal or we need to change your treatment, we will call you to review the results.  Follow-Up: At [facility], you and your health needs are our priority.  As part of our continuing mission to provide you with exceptional heart care, we have created designated Provider Care Teams.  These Care Teams include your primary Cardiologist (physician) and Advanced Practice Providers (APPs -  Physician Assistants and Nurse Practitioners) who all work together to provide you with the care you need, when you need it.  Your next appointment:   1 month(s)  The format for your next appointment:   In Person  Provider:   Orren Fabry, PA-C, Dayna Dunn, PA-C, Jackee Alberts, NP, Olivia Pavy, PA-C, Glendia Ferrier, PA-C, or Artist Pouch, PA-C     Other Instructions    1st Floor: - Lobby - Registration  - Pharmacy  - Lab - Cafe  2nd Floor: - PV Lab - Diagnostic Testing (echo, CT, nuclear med)  3rd Floor: - Vacant  4th Floor: - TCTS (cardiothoracic surgery) - AFib Clinic - Structural Heart Clinic - Vascular Surgery  - Vascular Ultrasound  5th Floor: - HeartCare Cardiology (general and EP) - Clinical Pharmacy for coumadin, hypertension, lipid,  weight-loss medications, and med management appointments    Valet parking services will be available as well.

## 2023-03-16 LAB — BASIC METABOLIC PANEL
BUN/Creatinine Ratio: 20 (ref 10–24)
BUN: 48 mg/dL — ABNORMAL HIGH (ref 8–27)
CO2: 22 mmol/L (ref 20–29)
Calcium: 8.6 mg/dL (ref 8.6–10.2)
Chloride: 101 mmol/L (ref 96–106)
Creatinine, Ser: 2.38 mg/dL — ABNORMAL HIGH (ref 0.76–1.27)
Glucose: 204 mg/dL — ABNORMAL HIGH (ref 70–99)
Potassium: 3.9 mmol/L (ref 3.5–5.2)
Sodium: 138 mmol/L (ref 134–144)
eGFR: 26 mL/min/{1.73_m2} — ABNORMAL LOW (ref >59)

## 2023-03-18 ENCOUNTER — Other Ambulatory Visit: Payer: Self-pay | Admitting: Family Medicine

## 2023-03-18 ENCOUNTER — Inpatient Hospital Stay: Payer: PPO | Admitting: Family Medicine

## 2023-03-18 DIAGNOSIS — I5022 Chronic systolic (congestive) heart failure: Secondary | ICD-10-CM

## 2023-03-19 ENCOUNTER — Other Ambulatory Visit: Payer: Self-pay

## 2023-03-19 ENCOUNTER — Ambulatory Visit: Payer: PPO | Admitting: Adult Health

## 2023-03-19 DIAGNOSIS — N183 Chronic kidney disease, stage 3 unspecified: Secondary | ICD-10-CM

## 2023-03-20 ENCOUNTER — Other Ambulatory Visit: Payer: Self-pay | Admitting: *Deleted

## 2023-03-20 ENCOUNTER — Encounter: Payer: Self-pay | Admitting: *Deleted

## 2023-03-20 NOTE — Patient Outreach (Signed)
Care Management  Transitions of Care Program Transitions of Care Post-discharge week 2   03/20/2023 Name: [name] MRN: [id number] DOB: 03/09/1933  Subjective: [name] is a 89 y.o. year old male who is a primary care patient of Pickard, Priscille Heidelberg, MD. The Care Management team Engaged with patient Engaged with patient by telephone to assess and address transitions of care needs.   Consent to Services:  Patient was given information about care management services, agreed to services, and gave verbal consent to participate.   Assessment: Per spouse, pt has been accepted to in home hospice services and a visit was completed today, no further concerns, issues voiced,  case closure.         SDOH Interventions    Flowsheet Row Telephone from 03/13/2023 in  POPULATION HEALTH DEPARTMENT Care Coordination from 05/21/2022 in [employer] Care Coordination Telephone from 05/09/2022 in [employer] Care Coordination Telephone from 05/04/2022 in [employer] Care Coordination  SDOH Interventions      Food Insecurity Interventions Intervention Not Indicated -- Intervention Not Indicated Intervention Not Indicated  Housing Interventions Intervention Not Indicated Intervention Not Indicated -- --  Transportation Interventions Intervention Not Indicated Intervention Not Indicated Intervention Not Indicated  [spouse provides transportation] Intervention Not Indicated  [spouse provides all transportation]  Utilities Interventions Intervention Not Indicated -- -- --        Goals Addressed             This Visit's Progress    COMPLETED: Transition of Care/ pt will have no readmissions within 30 days       Current Barriers:  Home Health services [facility] not contacted pt to schedule services Knowledge Deficits related to plan of care for management of CHF - pt will start weighing when he is more  steady on his feet, per spouse Clydie Braun, pt has dementia, is using walker, has hospital bed, states will be working with palliative care Spoke with spouse Clydie Braun who reports hospice came out to patient's home today and pt has been accepted to their services, no new concerns or needs voiced  RNCM Clinical Goal(s):  Patient will work with the Care Management team over the next 30 days to address Transition of Care Barriers: Home Health services verbalize understanding of plan for management of CHF as evidenced by spouse report, review of EMR and  through collaboration with RN Care manager, provider, and care team.   Interventions: Evaluation of current treatment plan related to  self management and patient's adherence to plan as established by provider  Heart Failure Interventions:  (Status:  New goal. and Goal on track:  Yes.) Short Term Goal Basic overview and discussion of pathophysiology of Heart Failure reviewed Provided education on low sodium diet Assessed need for readable accurate scales in home Provided education about placing scale on hard, flat surface Advised patient to weigh each morning after emptying bladder Discussed importance of daily weight and advised patient to weigh and record daily Reviewed role of diuretics in prevention of fluid overload and management of heart failure; Discussed the importance of keeping all appointments with provider Assessed social determinant of health barriers  Reviewed HF action plan, reportable signs/ symptoms Reviewed plan of care including case closure, pt is now active with hospice  Patient Goals/Self-Care Activities: Participate in Transition of Care Program/Attend [facility] scheduled calls Notify RN Care Manager of TOC call rescheduling needs Take all medications as prescribed Attend all scheduled  provider appointments Call pharmacy for medication refills 3-7 days in advance of running out of medications Call provider office for new concerns or  questions  call office if I gain more than 2 pounds in one day or 5 pounds in one week keep legs up while sitting use salt in moderation watch for swelling in feet, ankles and legs every day weigh myself daily develop a rescue plan follow rescue plan if symptoms flare-up eat more whole grains, fruits and vegetables, lean meats and healthy fats dress right for the weather, hot or cold Continue working with hospice at home  Follow Up Plan:  No further follow up required: pt is on home hospice services          Plan: The patient has been provided with contact information for the care management team and has been advised to call with any health related questions or concerns.   Irving Shows [facility], BSN RN Care Manager/ Transition of Care / [facility] [phone]

## 2023-03-22 ENCOUNTER — Telehealth: Payer: Self-pay | Admitting: Cardiovascular Disease

## 2023-03-22 NOTE — Telephone Encounter (Signed)
Returned call to Raynelle Fanning who states she went out for her assessment today and notes Lung sounds clear, +1 edema. She questioned if he should be taking the furosemide daily or every other day. Per Ernest's note on 03/15/23: -Start Lasix 20mg  every other day for one week, then as needed.  No further questions, she will call us with any updates or as needed.

## 2023-03-22 NOTE — Telephone Encounter (Signed)
New Message:     Raynelle Fanning from [facility] would like for Dr Earmon Phoenix nurse to call her please.

## 2023-03-29 ENCOUNTER — Other Ambulatory Visit: Payer: Self-pay | Admitting: Family Medicine

## 2023-03-29 MED ORDER — TRELEGY ELLIPTA 200-62.5-25 MCG/ACT IN AEPB: 1.0000 | INHALATION_SPRAY | Freq: Every day | RESPIRATORY_TRACT | 11 refills | Status: DC

## 2023-04-07 ENCOUNTER — Other Ambulatory Visit: Payer: Self-pay | Admitting: Family Medicine

## 2023-04-07 DIAGNOSIS — E119 Type 2 diabetes mellitus without complications: Secondary | ICD-10-CM

## 2023-04-08 ENCOUNTER — Other Ambulatory Visit: Payer: Self-pay

## 2023-04-08 DIAGNOSIS — R0602 Shortness of breath: Secondary | ICD-10-CM

## 2023-04-08 MED ORDER — FLUTICASONE FUROATE-VILANTEROL 200-25 MCG/ACT IN AEPB: 1.0000 | INHALATION_SPRAY | Freq: Every day | RESPIRATORY_TRACT | 3 refills | Status: DC

## 2023-05-02 NOTE — Progress Notes (Unsigned)
 Cardiology Office Note    Patient Name: [name] Date of Encounter: 05/03/2023  Primary Care Provider:  Donita Brooks, MD Primary Cardiologist:  Tonny Bollman, MD Primary Electrophysiologist: None   Past Medical History    Past Medical History:  Diagnosis Date   Acute blood loss anemia    Acute upper respiratory infections of unspecified site 11/04/2012   AKI (acute kidney injury) (HCC)    Alzheimer disease (HCC)    Benign prostatic hyperplasia    CAD (coronary artery disease)    s/p cypher DES to pLAD 6/08; normal LVF;  ETT-Myoview 2009: no ischemia    Clavicle fracture 08/10/2019   Closed displaced fracture of phalanx of left thumb, sequela 08/10/2019   Coronary atherosclerosis of native coronary artery 03/08/2008   Dizziness 03/08/2020   Eosinophilia    Essential hypertension    Fracture of multiple ribs with pain 08/10/2019   Hematoma 11/04/2012   History of multiple falls    History of SAH (subarachnoid hemorrhage) 07/14/2019   Hyperlipidemia type IIB / III 03/08/2008   Itching of ear 07/14/2021   Major neurocognitive disorder due to possible Alzheimer's disease, without behavioral disturbance (HCC) 02/29/2020   MI (myocardial infarction) (HCC)    Moderate persistent asthma    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   Imaging revealed [facility]   Multiple trauma    Nocturnal leg cramps 11/23/2012   Orthostatic hypotension 03/08/2008   Otalgia of left ear 05/11/2021   Otorrhagia of left ear 03/30/2021   Pain in joint, shoulder region 11/23/2012   Stage 3b chronic kidney disease (HCC)    Thrombocytopenia (HCC)    Trigeminal neuralgia    Type 2 diabetes mellitus (HCC)     History of Present Illness  [name] is a 89 y.o. male with a PMH of CAD s/p PCI to LAD in 2008, HFpEF, HTN, HLD carotid artery stenosis, CKD stage III, traumatic SAH 07/2019, persistent AF, Alzheimer's dementia who presents today for 1 month  follow-up.  [name] was last seen on 03/15/2023 for hospital follow-up.  He had previous admission on 03/06/2023 with shortness of breath and elevated fever.  He was treated with Lasix drip and restarted on Lasix daily.  During his follow-up visit patient was retaining fluid and was sleeping in a recliner due to increased shortness of breath.  He was started on Lasix every other day and then as needed as needed.  He denied any chest pain and blood pressure was stable at follow-up.  Patient was recently placed in an in-home hospice with contact from nurse on 03/22/2023 stating that patient had clear lung sounds with +1 edema.  Patient was encouraged to continue as needed Lasix 20 mg.  No interval ED visits or additional follow-ups.  [name] presents today for 1 month follow-up. He reports experiencing new dizziness since Tuesday, primarily when standing up quickly, especially in the morning. This dizziness is associated with the initiation of tamsulosin, which he started a few weeks ago. An episode of vomiting occurred on the same day the dizziness began, but it was a single episode without retching. He also mentions a possible episode of diarrhea, although he was not at home to confirm. Swelling is particularly noted in the right ankle and has not improved despite taking Lasix (furosemide) every other day. The swelling worsened during a recent stay at a hospice home where he was unable to elevate his legs as much as at home. He has  had significant injuries from falls, including a large hematoma on his back and swelling on his hip, which may be contributing to the swelling. He is unable to weigh himself accurately due to his size and recent falls. He sometimes experiences increased thirst at night, leading to increased fluid intake, which he believes contributes to the swelling.  He was also recently started on tamsulosin which may be contributing to his dizziness.  He is currently taking Lasix every other  day and has been advised to take potassium with it, although he has not been taking potassium regularly as his levels were stable during his last hospital visit. He expresses concern about the swelling not being under control and mentions that no recent blood work has been done to check his kidney function. No shortness of breath or significant coughing associated with the swelling. The swelling is limited to the right ankle and does not extend to the thighs. No new cardiac symptoms or palpitations.  Discussed the use of AI scribe software for clinical note transcription with the patient, who gave verbal consent to proceed.  History of Present Illness    Review of Systems  Please see the history of present illness.    All other systems reviewed and are otherwise negative except as noted above.  Physical Exam    Wt Readings from Last 3 Encounters:  05/03/23 177 lb (80.3 kg)  03/15/23 167 lb (75.8 kg)  03/10/23 158 lb 8.2 oz (71.9 kg)   VS: Vitals:   05/03/23 0853  BP: (!) 126/56  Pulse: 65  SpO2: 99%  ,Body mass index is 28.57 kg/m. GEN: Well nourished, well developed in no acute distress Neck: No JVD; No carotid bruits Pulmonary: Clear to auscultation without rales, wheezing or rhonchi  Cardiovascular: Normal rate. Regular rhythm. Normal S1. Normal S2.   Murmurs: There is no murmur.  ABDOMEN: Soft, non-tender, non-distended EXTREMITIES: +1 right lower extremity edema and trace left lower extremity edema  EKG/LABS/ Recent Cardiac Studies   ECG personally reviewed by me today -none completed today  Risk Assessment/Calculations:          Lab Results  Component Value Date   WBC 7.2 03/08/2023   HGB 9.7 (L) 03/08/2023   HCT 29.5 (L) 03/08/2023   MCV 89.1 03/08/2023   PLT 279 03/08/2023   Lab Results  Component Value Date   CREATININE 2.38 (H) 03/15/2023   BUN 48 (H) 03/15/2023   NA 138 03/15/2023   K 3.9 03/15/2023   CL 101 03/15/2023   CO2 22 03/15/2023   Lab  Results  Component Value Date   CHOL 110 04/28/2022   HDL 50 04/28/2022   LDLCALC 52 04/28/2022   TRIG 39 04/28/2022   CHOLHDL 2.2 04/28/2022    Lab Results  Component Value Date   HGBA1C 6.2 (H) 04/28/2022   Assessment & Plan    1.HFpEF: -2D echo completed 02/2023 with mildly decreased EF of 45 to 20% and grade 3 DD with dilated LA, trivial MVR and no pericardial effusion -Today patient is euvolemic on exam with trace lower extremity edema and left ankle and +1 in the right - Recommend compression stockings to manage venous insufficiency. - Provide brochure for [employer] for compression stocking fitting. -Patient advised to keep Lasix 20 mg as needed and make sure to take potassium 10 mEq with as needed dose only -We will check BMET today - Monitor weight and symptoms to guide diuretic use.  2.Persistent AF: -Patient is currently rate  controlled at 65 bpm -Continue amiodarone 200 mg, and ASA 81 mg -   3.CAD: -s/p PCI to LAD in 2008 with LAD ischemic evaluation completed by GXT in 2014 -Today patient reports no chest pain or angina since previous follow-up. -Continue current GDMT with ASA 81 mg, ezetimibe 10 mg  4.Essential hypertension: -Patient's blood pressure today was stable at 126/56 -Continue Norvasc 5 mg -If swelling in lower extremities persistent we may discontinue Norvasc  5. CKD stage III: -Patient's last creatinine was 2.3 with normal potassium -We will check BMET today  Disposition: Follow-up with Tonny Bollman, MD or APP in 6 months   Signed, Napoleon Form, Leodis Rains, NP 05/03/2023, 9:10 AM [facility]

## 2023-05-03 ENCOUNTER — Encounter: Payer: Self-pay | Admitting: Nurse Practitioner

## 2023-05-03 ENCOUNTER — Ambulatory Visit: Payer: HMO | Attending: Internal Medicine | Admitting: Nurse Practitioner

## 2023-05-03 VITALS — BP 126/56 | HR 65 | Ht 66.0 in | Wt 177.0 lb

## 2023-05-03 DIAGNOSIS — I2510 Atherosclerotic heart disease of native coronary artery without angina pectoris: Secondary | ICD-10-CM

## 2023-05-03 DIAGNOSIS — I5032 Chronic diastolic (congestive) heart failure: Secondary | ICD-10-CM | POA: Diagnosis not present

## 2023-05-03 DIAGNOSIS — I10 Essential (primary) hypertension: Secondary | ICD-10-CM

## 2023-05-03 DIAGNOSIS — I4819 Other persistent atrial fibrillation: Secondary | ICD-10-CM | POA: Diagnosis not present

## 2023-05-03 DIAGNOSIS — N183 Chronic kidney disease, stage 3 unspecified: Secondary | ICD-10-CM | POA: Diagnosis not present

## 2023-05-03 MED ORDER — POTASSIUM CHLORIDE CRYS ER 10 MEQ PO TBCR: EXTENDED_RELEASE_TABLET | ORAL | Status: AC

## 2023-05-03 NOTE — Patient Instructions (Addendum)
 Medication Instructions:  ONLY Take Potassium when you take Lasix  *If you need a refill on your cardiac medications before your next appointment, please call your pharmacy*  Lab Work: TODAY-BMET & MAG  If you have labs (blood work) drawn today and your tests are completely normal, you will receive your results only by: MyChart Message (if you have MyChart) OR A paper copy in the mail If you have any lab test that is abnormal or we need to change your treatment, we will call you to review the results.  Testing/Procedures: NONE ORDERED  Follow-Up: At [facility], you and your health needs are our priority.  As part of our continuing mission to provide you with exceptional heart care, our providers are all part of one team.  This team includes your primary Cardiologist (physician) and Advanced Practice Providers or APPs (Physician Assistants and Nurse Practitioners) who all work together to provide you with the care you need, when you need it.  Your next appointment:   6 month(s)  Provider:   Tonny Bollman, MD     We recommend signing up for the patient portal called "MyChart".  Sign up information is provided on this After Visit Summary.  MyChart is used to connect with patients for Virtual Visits (Telemedicine).  Patients are able to view lab/test results, encounter notes, upcoming appointments, etc.  Non-urgent messages can be sent to your provider as well.   To learn more about what you can do with MyChart, go to [email or web address].   Other Instructions Please check your weight daily. Please contact the office if you gain more than 2lbs in a day or 5lbs in a week.  Limit your salt intake to 1500-2000mg  per day or 500mg  of Sodium per meal.  Please get some ted hose or compression stockings. They can be purchased at your local medical supply store, [employer], [employer] or [employer] fund[employer].  Put them on first thing in the morning and wear them during the day . Elevate your  feet during the day and remove hose in the evening before bed.       1st Floor: - Lobby - Registration  - Pharmacy  - Lab - Cafe  2nd Floor: - PV Lab - Diagnostic Testing (echo, CT, nuclear med)  3rd Floor: - Vacant  4th Floor: - TCTS (cardiothoracic surgery) - AFib Clinic - Structural Heart Clinic - Vascular Surgery  - Vascular Ultrasound  5th Floor: - [facility]Care Cardiology (general and EP) - Clinical Pharmacy for coumadin, hypertension, lipid, weight-loss medications, and med management appointments    Valet parking services will be available as well.

## 2023-05-04 LAB — BASIC METABOLIC PANEL WITH GFR
BUN/Creatinine Ratio: 16 (ref 10–24)
BUN: 36 mg/dL — ABNORMAL HIGH (ref 8–27)
CO2: 21 mmol/L (ref 20–29)
Calcium: 8.3 mg/dL — ABNORMAL LOW (ref 8.6–10.2)
Chloride: 103 mmol/L (ref 96–106)
Creatinine, Ser: 2.29 mg/dL — ABNORMAL HIGH (ref 0.76–1.27)
Glucose: 79 mg/dL (ref 70–99)
Potassium: 4.3 mmol/L (ref 3.5–5.2)
Sodium: 138 mmol/L (ref 134–144)
eGFR: 27 mL/min/{1.73_m2} — ABNORMAL LOW (ref >59)

## 2023-05-04 LAB — MAGNESIUM: Magnesium: 2.1 mg/dL (ref 1.6–2.3)

## 2023-05-11 NOTE — Patient Instructions (Signed)
 Visit Information  Thank you for taking time to visit with me today. Please don't hesitate to contact me if I can be of assistance to you.   Following are the goals we discussed today:   Goals Addressed             This Visit's Progress    Manage home safety, memory concerns- care coordination services        Interventions Today    Flowsheet Row Most Recent Value  Chronic Disease   Chronic disease during today's visit Other  [mobility & incontinence palliative care Dementia, slipped out of chair]  General Interventions   General Interventions Discussed/Reviewed General Interventions Reviewed, [employer], Doctor Visits, Horticulturist, commercial (DME)  Doctor Visits Discussed/Reviewed Doctor Visits Reviewed, PCP, Specialist  Durable Medical Equipment (DME) Other  [donut hole, coccyx gel cushion]  PCP/Specialist Visits Compliance with follow-up visit  Education Interventions   Education Provided Provided Education  Provided Verbal Education On [employer], [employer], When to see the doctor, Other  [facility] coverage vs HTA, palliative care/hospice care, dementia grant for care givers, fire dept for assist if falls skin care protection]  Mental Health Interventions   Mental Health Discussed/Reviewed Mental Health Reviewed, Coping Strategies, Other  Pharmacy Interventions   Pharmacy Dicussed/Reviewed Pharmacy Topics Reviewed, Affording Medications  Safety Interventions   Safety Discussed/Reviewed Safety Reviewed, Fall Risk, Home Safety  Home Safety Assistive Devices  Advanced Directive Interventions   Advanced Directives Discussed/Reviewed Advanced Directives Discussed, Advanced Care Planning  [has most forms in EPIC]              Our next appointment is no further scheduled appointments.  on as needed at -  Please call the care guide team at [phone] if you need to cancel or reschedule your appointment.   If you are experiencing a Mental Health or  Behavioral Health Crisis or need someone to talk to, please call the Suicide and Crisis Lifeline: 988 call the [place] National Suicide Prevention Lifeline: 318-251-1359 or TTY: 236-494-8564 TTY 434-801-6786) to talk to a trained counselor call 1-800-273-TALK (toll free, 24 hour hotline) call the [facility]: ([phone])004-1686 call 911   Patient verbalizes understanding of instructions and care plan provided today and agrees to view in MyChart. Active MyChart status and patient understanding of how to access instructions and care plan via MyChart confirmed with patient.     The patient has been provided with contact information for the care management team and has been advised to call with any health related questions or concerns.    Taiya Nutting L. Noelle Penner, RN, BSN, CCM [facility], [facility] RN Care Manager Direct Dial: [phone]  Fax: [phone]

## 2023-05-14 ENCOUNTER — Ambulatory Visit (INDEPENDENT_AMBULATORY_CARE_PROVIDER_SITE_OTHER): Payer: PPO | Admitting: Physician Assistant

## 2023-05-14 ENCOUNTER — Encounter: Payer: Self-pay | Admitting: Physician Assistant

## 2023-05-14 VITALS — BP 136/61 | HR 90 | Resp 18 | Ht 66.0 in | Wt 175.0 lb

## 2023-05-14 DIAGNOSIS — F0390 Unspecified dementia without behavioral disturbance: Secondary | ICD-10-CM

## 2023-05-14 NOTE — Patient Instructions (Addendum)
 It was a pleasure to see you today at our office.   Recommendations:  Follow up Oct 20 at 11:30  Continue Memantine 10 mg at night    Whom to call:  Memory  decline, memory medications: Call our office [phone]   For psychiatric meds, mood meds: Please have your primary care physician manage these medications.   Counseling regarding caregiver distress, including caregiver depression, anxiety and issues regarding community resources, adult day care programs, adult living facilities, or memory care questions:   Feel free to contact Misty Lisabeth Register, Social Worker at [phone]   For assessment of decision of mental capacity and competency:  Call Dr. Erick Blinks, geriatric psychiatrist at [phone]  For guidance in geriatric dementia issues please call [facility] [phone]  For guidance regarding [facility] and if placement were needed at the facility, contact Sidney Ace, Social Worker tel: [phone]  If you have any severe symptoms of a stroke, or other severe issues such as confusion,severe chills or fever, etc call 911 or go to the ER as you may need to be evaluated further   Feel free to visit Facebook page " [employer]" for tips of how to care for people with memory problems.       RECOMMENDATIONS FOR ALL PATIENTS WITH MEMORY PROBLEMS: 1. Continue to exercise (Recommend 30 minutes of walking everyday, or 3 hours every week) 2. Increase social interactions - continue going to [place] and enjoy social gatherings with friends and family 3. Eat healthy, avoid fried foods and eat more fruits and vegetables 4. Maintain adequate blood pressure, blood sugar, and blood cholesterol level. Reducing the risk of stroke and cardiovascular disease also helps promoting better memory. 5. Avoid stressful situations. Live a simple life and avoid aggravations. Organize your time and prepare for the next day in anticipation. 6. Sleep well, avoid  any interruptions of sleep and avoid any distractions in the bedroom that may interfere with adequate sleep quality 7. Avoid sugar, avoid sweets as there is a strong link between excessive sugar intake, diabetes, and cognitive impairment We discussed the Mediterranean diet, which has been shown to help patients reduce the risk of progressive memory disorders and reduces cardiovascular risk. This includes eating fish, eat fruits and green leafy vegetables, nuts like almonds and hazelnuts, walnuts, and also use olive oil. Avoid fast foods and fried foods as much as possible. Avoid sweets and sugar as sugar use has been linked to worsening of memory function.  There is always a concern of gradual progression of memory problems. If this is the case, then we may need to adjust level of care according to patient needs. Support, both to the patient and caregiver, should then be put into place.    The Alzheimer's Association is here all day, every day for people facing Alzheimer's disease through our free 24/7 Helpline: 708-739-8894. The Helpline provides reliable information and support to all those who need assistance, such as individuals living with memory loss, Alzheimer's or other dementia, caregivers, health care professionals and the public.  Our highly trained and knowledgeable staff can help you with: Understanding memory loss, dementia and Alzheimer's  Medications and other treatment options  General information about aging and brain health  Skills to provide quality care and to find the best care from professionals  Legal, financial and living-arrangement decisions Our Helpline also features: Confidential care consultation provided by master's level clinicians who can help with decision-making support, crisis assistance and education on issues families  face every day  Help in a caller's preferred language using our translation service that features more than 200 languages and dialects  Referrals  to local community programs, services and ongoing support     FALL PRECAUTIONS: Be cautious when walking. Scan the area for obstacles that may increase the risk of trips and falls. When getting up in the mornings, sit up at the edge of the bed for a few minutes before getting out of bed. Consider elevating the bed at the head end to avoid drop of blood pressure when getting up. Walk always in a well-lit room (use night lights in the walls). Avoid area rugs or power cords from appliances in the middle of the walkways. Use a walker or a cane if necessary and consider physical therapy for balance exercise. Get your eyesight checked regularly.  FINANCIAL OVERSIGHT: Supervision, especially oversight when making financial decisions or transactions is also recommended.  HOME SAFETY: Consider the safety of the kitchen when operating appliances like stoves, microwave oven, and blender. Consider having supervision and share cooking responsibilities until no longer able to participate in those. Accidents with firearms and other hazards in the house should be identified and addressed as well.   ABILITY TO BE LEFT ALONE: If patient is unable to contact 911 operator, consider using LifeLine, or when the need is there, arrange for someone to stay with patients. Smoking is a fire hazard, consider supervision or cessation. Risk of wandering should be assessed by caregiver and if detected at any point, supervision and safe proof recommendations should be instituted.  MEDICATION SUPERVISION: Inability to self-administer medication needs to be constantly addressed. Implement a mechanism to ensure safe administration of the medications.   DRIVING: Regarding driving, in patients with progressive memory problems, driving will be impaired. We advise to have someone else do the driving if trouble finding directions or if minor accidents are reported. Independent driving assessment is available to determine safety of  driving.   If you are interested in the driving assessment, you can contact the following:  [employer] in [place] [phone]  [facility] [phone]  [facility] [phone] or [phone]      Mediterranean Diet A Mediterranean diet refers to food and lifestyle choices that are based on the traditions of countries located on the [employer]. This way of eating has been shown to help prevent certain conditions and improve outcomes for people who have chronic diseases, like kidney disease and heart disease. What are tips for following this plan? Lifestyle  Cook and eat meals together with your family, when possible. Drink enough fluid to keep your urine clear or pale yellow. Be physically active every day. This includes: Aerobic exercise like running or swimming. Leisure activities like gardening, walking, or housework. Get 7-8 hours of sleep each night. If recommended by your health care provider, drink red wine in moderation. This means 1 glass a day for nonpregnant women and 2 glasses a day for men. A glass of wine equals 5 oz (150 mL). Reading food labels  Check the serving size of packaged foods. For foods such as rice and pasta, the serving size refers to the amount of cooked product, not dry. Check the total fat in packaged foods. Avoid foods that have saturated fat or trans fats. Check the ingredients list for added sugars, such as corn syrup. Shopping  At the grocery store, buy most of your food from the areas near the walls of  the store. This includes: Fresh fruits and vegetables (produce). Grains, beans, nuts, and seeds. Some of these may be available in unpackaged forms or large amounts (in bulk). Fresh seafood. Poultry and eggs. Low-fat dairy products. Buy whole ingredients instead of prepackaged foods. Buy fresh fruits and vegetables in-season from local farmers markets. Buy  frozen fruits and vegetables in resealable bags. If you do not have access to quality fresh seafood, buy precooked frozen shrimp or canned fish, such as tuna, salmon, or sardines. Buy small amounts of raw or cooked vegetables, salads, or olives from the deli or salad bar at your store. Stock your pantry so you always have certain foods on hand, such as olive oil, canned tuna, canned tomatoes, rice, pasta, and beans. Cooking  Cook foods with extra-virgin olive oil instead of using butter or other vegetable oils. Have meat as a side dish, and have vegetables or grains as your main dish. This means having meat in small portions or adding small amounts of meat to foods like pasta or stew. Use beans or vegetables instead of meat in common dishes like chili or lasagna. Experiment with different cooking methods. Try roasting or broiling vegetables instead of steaming or sauteing them. Add frozen vegetables to soups, stews, pasta, or rice. Add nuts or seeds for added healthy fat at each meal. You can add these to yogurt, salads, or vegetable dishes. Marinate fish or vegetables using olive oil, lemon juice, garlic, and fresh herbs. Meal planning  Plan to eat 1 vegetarian meal one day each week. Try to work up to 2 vegetarian meals, if possible. Eat seafood 2 or more times a week. Have healthy snacks readily available, such as: Vegetable sticks with hummus. Greek yogurt. Fruit and nut trail mix. Eat balanced meals throughout the week. This includes: Fruit: 2-3 servings a day Vegetables: 4-5 servings a day Low-fat dairy: 2 servings a day Fish, poultry, or lean meat: 1 serving a day Beans and legumes: 2 or more servings a week Nuts and seeds: 1-2 servings a day Whole grains: 6-8 servings a day Extra-virgin olive oil: 3-4 servings a day Limit red meat and sweets to only a few servings a month What are my food choices? Mediterranean diet Recommended Grains: Whole-grain pasta. Brown rice. Bulgar  wheat. Polenta. Couscous. Whole-wheat bread. Orpah Cobb. Vegetables: Artichokes. Beets. Broccoli. Cabbage. Carrots. Eggplant. Green beans. Chard. Kale. Spinach. Onions. Leeks. Peas. Squash. Tomatoes. Peppers. Radishes. Fruits: Apples. Apricots. Avocado. Berries. Bananas. Cherries. Dates. Figs. Grapes. Lemons. Melon. Oranges. Peaches. Plums. Pomegranate. Meats and other protein foods: Beans. Almonds. Sunflower seeds. Pine nuts. Peanuts. Cod. Salmon. Scallops. Shrimp. Tuna. Tilapia. Clams. Oysters. Eggs. Dairy: Low-fat milk. Cheese. Greek yogurt. Beverages: Water. Red wine. Herbal tea. Fats and oils: Extra virgin olive oil. Avocado oil. Grape seed oil. Sweets and desserts: [place] yogurt with honey. Baked apples. Poached pears. Trail mix. Seasoning and other foods: Basil. Cilantro. Coriander. Cumin. Mint. Parsley. Sage. Rosemary. Tarragon. Garlic. Oregano. Thyme. Pepper. Balsalmic vinegar. Tahini. Hummus. Tomato sauce. Olives. Mushrooms. Limit these Grains: Prepackaged pasta or rice dishes. Prepackaged cereal with added sugar. Vegetables: Deep fried potatoes (french fries). Fruits: Fruit canned in syrup. Meats and other protein foods: Beef. Pork. Lamb. Poultry with skin. Hot dogs. Tomasa Blase. Dairy: Ice cream. Sour cream. Whole milk. Beverages: Juice. Sugar-sweetened soft drinks. Beer. Liquor and spirits. Fats and oils: Butter. Canola oil. Vegetable oil. Beef fat (tallow). Lard. Sweets and desserts: Cookies. Cakes. Pies. Candy. Seasoning and other foods: Mayonnaise. Premade sauces and marinades. The items listed may  not be a complete list. Talk with your dietitian about what dietary choices are right for you. Summary The Mediterranean diet includes both food and lifestyle choices. Eat a variety of fresh fruits and vegetables, beans, nuts, seeds, and whole grains. Limit the amount of red meat and sweets that you eat. Talk with your health care provider about whether it is safe for you to drink red  wine in moderation. This means 1 glass a day for nonpregnant women and 2 glasses a day for men. A glass of wine equals 5 oz (150 mL). This information is not intended to replace advice given to you by your health care provider. Make sure you discuss any questions you have with your health care provider. Document Released: 09/15/2015 Document Revised: 10/18/2015 Document Reviewed: 09/15/2015 Elsevier Interactive Patient Education  2017 [employer].

## 2023-05-14 NOTE — Progress Notes (Signed)
 Assessment/Plan:   Dementia likely due to Alzheimer's Disease    [name] is a very pleasant 89 y.o. RH male with a history of CHF with acute exacerbation Jan 2025 requiring hospitalization, history of hypertension, CAD, diabetes, hyperlipidemia, COPD, DM2, stage IIIb CKD, anemia,with a diagnosis of dementia likely due to Alzheimer's disease per neuropsychological evaluation on 09/2021 seen today in follow up for memory loss. Patient is currently on memantine 10 mg bid. Mild cognitive decline is noted, bus still able to perform some ADLS to his ability. He is under Hospice care, although this is to be revised soon, hopefully to return to Palliative Care  .     Follow up in 6  months. Continue memantine 10 mg daily, side effects, Recommend good control of her cardiovascular risk factors Continue to control mood as per PCP Agree with Hospice involvement     Subjective:    This patient is accompanied in the office by his wife who supplements the history.  Previous records as well as any outside records available were reviewed prior to todays visit. Patient was last seen on October 2024 with MMSE 18/30   Any changes in memory since last visit? "No significant changes". He likes to read familiar books, listening to classical music, singing, watching did not Geo channel, history channel.  He  struggles more with STM.  LTM is affected as well  Since January 2025 he is on Hospice at home.  repeats oneself?  Endorsed, only with appointments. Disoriented when walking into a room?  Denies Leaving objects?  May misplace things but not in unusual places   Wandering behavior? " First time last nigh ,he was having trouble sleeping and went to the front door because he needed to give something to Father before mass".  Any personality changes since last visit? He gets fixated on items he wants with him.  Any worsening depression? Denies                Hallucinations or paranoia?   "Recently  he may  have had a minor hallucination . He heard a door closing, may smell food, something hanging from the walker".  Seizures? denies    Any sleep changes?  Sometimes she has vivid dreams, with REM behavior, as before, but minor. Sleep apnea?   Denies.   Any hygiene concerns?  Needs to be reminded to shower, needs assistance due to fall risk. Independent of bathing and dressing?  Endorsed  Does the patient needs help with medications?  wife is in charge   Who is in charge of the finances?  Wife  is in charge     Any changes in appetite?  Denies.     Patient have trouble swallowing? Denies.   Does the patient cook? No Any headaches?   denies   Chronic back pain he has a history of chronic back pain, "but is bearable". Ambulates with difficulty?  Uses a walker to ambulate due to fall risk.  Recent falls or head injuries? 1 month ago he fell twice in 1 week, one of them R hip with a lot of swelling. No LoC or head injury.  Unilateral weakness, numbness or tingling? denies   Any tremors?  He has bilateral resting tremors, attributed to many things, chronic Any anosmia?  Denies   Any incontinence of urine?  Endorsed, wears diapers Patient lives with his wife  Does the patient drive? No longer drives       Neuropsych evaluation 09/29/21  Dr. Milbert Coulter  Major Neurocognitive Disorder ("dementia") at the present time.   Regarding etiology, primary concerns remain for a late-onset Alzheimer's disease presentation remain. Dr. Autumn Messing did not benefit from repeated exposure to information across learning trials, was essentially amnestic across all memory tasks after a brief delay, and performed poorly across recognition trials. This suggests evidence for rapid forgetting and a significant memory storage impairment, both of which are hallmark characteristics of this illness. A weakness in semantic fluency (relative to phonemic fluency), variability across confrontation naming, and impaired clock drawing  would further align with typical disease trajectory. Given subtle decline in the past 18 months, this would suggest a slow moving disease process, which is encouraging.    There are likely secondary contributions to his overall clinical presentation stemming from his prior small brain bleed/TBI, as well as recent neuroimaging suggesting mild microvascular ischemic disease. These could be largely responsible for deficits surrounding processing speed, complex attention, and cognitive flexibility. Visuospatial variability could be attributed to his TBI given some likely parietal lobe involvement. However, these factors would not explain amnestic memory and overall memory impairment by themselves. As such, I continue to feel that these factors serve to worsen an underlying neurodegenerative illness, most likely late-onset Alzheimer's disease, at the present time.      History on Initial Assessment 03/10/2020: This is a pleasant 89 year old right-handed man with a history of hypertension, CAD, diabetes, hyperlipidemia, presenting for management of Alzheimer's disease. He underwent Neuropsychological testing in last month with findings concerning for AD. Records were reviewed. He had an unwitnessed fall in June 2021 and sustained several fractures and a subarachnoid hemorrhage on the left that did not require surgical intervention. He started reporting forgetfulness at that time. He was discharged home but back in the hospital a month later with headache, low back pain and fever due to UTI. Head CT showed a left hemispheric subdural hemorrhage, new from prior CT, resolved on follow-up CT in September 2021. He had another fall in November 2021 when he missed a step. He was back in the ER a few weeks later due to increasing confusion and forgetfulness, frequent right-sided headaches, and a clicking noise in his neck, attributed to his recent fall. He was back in the ER again in January 2022 for dizziness, worse when he  moves quickly or gets up too quickly, leading to falls. 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 Vestibular therapy helped with dizziness. Neuropsychological evaluation indicated prominent impairment surrounding all aspects of learning and memory, meeting criteria for Major Neurocognitive Disorder, mild end of spectrum, etiology concerning for Alzheimer's disease. There may be a vascular component as well. Prior TBI exacerbated deficits that were already present rather than being primarily responsible for dysfunction.    They report his long-term memory is good, he can remember long passages from Shakespeare. Clydie Braun reports some memory issues prior to the fall, for instance he would recall a story differently or he could not keep an appointment date in his mind or a prior decision they had made. He has not been driving. He manages his own pillbox with a good system, Clydie Braun checks behind him and notes he makes mistakes sometimes. He denies any difficulties managing finances, Clydie Braun has been managing them since the fall in June and notes there was a notification from the bank prior to his fall. He is fairly independent, Clydie Braun stays  close during dressing and bathing mostly for safety purposes. He gets dizzy the most when bending down. He uses a cane all the time. He states mood is not bad, "I'm not miserable." A few times he would ask their daughter where her mother or when Clydie Braun is coming back when she is right there. He thinks the daughter who lives with them is a boy, using male pronouns for her, which is new. There have been times he thinks there is a second cat or the cat is a dog, mostly at night. He used to think they had 2 bedrooms.    He denies any headaches. He has occasional vertigo and feels like he would fall, last fall was in January. Dizziness mostly occurs when he gets up, but he  has also reported it while sitting or supine. It lasts at most 2 minutes, no nausea/vomiting, vision changes. The other night he woke up and saw nothing except for 2 black rings that he described as full black moons, this has happened twice. He has left trigeminal neuralgia. He was having orthostatic hypotension initially after the fall and they had to stop PT. BP has improved but he still has occasional dizziness. No focal numbness/tingling/weakness. He has neck pain and notes a "clicking" in his neck. Cervical xray showed cervical spondylosis, trace grade 1 anterolisthesis at C2-3, C3-4, C5-6, and C6-7. He has low back pain. No bowel/bladder dysfunction. No anosmia but sometimes smells food when there is none, usually at night. He has mild hand tremors. He has occasional daytime drowsiness, sleeping on his back due to pain, however Clydie Braun notes he sounds like he is gasping for breath. He snores sometimes.       MRI brain with and without contrast done 04/2020 did not show any acute changes. 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    His exam on initial visit also showed hyperreflexia and fasciculations, and he was reporting neck pain. MRI cervical spine did not show any cord abnormalities, there was mild to moderate spinal stenosis and foraminal stenosis at several levels.    EMG/NCV was normal, no evidence of motor neuron disease.    PREVIOUS MEDICATIONS: Rivastigmine, Aricept  "hollow head "    CURRENT MEDICATIONS:  Outpatient Encounter Medications as of 05/14/2023  Medication Sig   acetaminophen (TYLENOL) 500 MG tablet Take 1,000 mg by mouth as needed for headache or mild pain (pain score 1-3).   albuterol (PROAIR HFA) 108 (90 Base) MCG/ACT inhaler INHALE 2 PUFFS INTO THE LUNGS EVERY 6 HOURS AS NEEDED FOR WHEEZING OR SHORTNESS OF BREATH (Patient taking differently: Inhale 1-2 puffs into the  lungs every 6 (six) hours as needed for shortness of breath. INHALE 2 PUFFS INTO THE LUNGS EVERY 6 HOURS AS NEEDED FOR WHEEZING OR SHORTNESS OF BREATH)   amiodarone (PACERONE) 200 MG tablet Take 0.5 tablets (100 mg total) by mouth daily.   amLODipine (NORVASC) 5 MG tablet Take 1 tablet (5 mg total) by mouth daily. (Patient taking differently: Take 5 mg by mouth at bedtime.)   aspirin EC 81 MG tablet Take 1 tablet (81 mg total) by mouth daily. Swallow whole.   atorvastatin (LIPITOR) 20 MG tablet TAKE 1 TABLET BY MOUTH EVERY DAY   escitalopram (LEXAPRO) 10 MG tablet Take 1 tablet (10 mg total) by mouth daily.   ezetimibe (ZETIA) 10 MG tablet TAKE 1 TABLET BY MOUTH EVERY DAY   fluticasone furoate-vilanterol (  BREO ELLIPTA) 200-25 MCG/ACT AEPB Inhale 1 puff into the lungs daily.   furosemide (LASIX) 20 MG tablet Take 1 tablet (20 mg total) by mouth every other day for 1 week, then take once daily as needed for weight gain of 2 lbs or more overnight.   JARDIANCE 10 MG TABS tablet TAKE 1 TABLET BY MOUTH DAILY BEFORE BREAKFAST.   memantine (NAMENDA) 5 MG tablet TAKE 1 TABLET BY MOUTH EVERYDAY AT BEDTIME   potassium chloride (KLOR-CON M) 10 MEQ tablet Only take when taking Lasix   tamsulosin (FLOMAX) 0.4 MG CAPS capsule SMARTSIG:1 Capsule(s) By Mouth Every Evening   No facility-administered encounter medications on file as of 05/14/2023.       11/12/2022   12:00 PM 04/17/2022   12:00 PM 09/09/2020   12:00 PM  MMSE - Mini Mental State Exam  Orientation to time 0 0 1  Orientation to Place 3 2 5   Registration 3 3 3   Attention/ Calculation 4 5 5   Recall 0 0 0  Language- name 2 objects 2 2 2   Language- repeat 1 1 1   Language- follow 3 step command 3 3 2   Language- read & follow direction 1 1 1   Write a sentence 1 1 1   Copy design 0 1 0  Total score 18 19 21        No data to display          Objective:     PHYSICAL EXAMINATION:    VITALS:   Vitals:   05/14/23 1110  BP: 136/61  Pulse:  90  Resp: 18  SpO2: 96%  Weight: 175 lb (79.4 kg)  Height: 5\' 6"  (1.676 m)    GEN:  The patient appears stated age and is in NAD. HEENT:  Normocephalic, atraumatic.   Neurological examination:  General: NAD, well-groomed, appears stated age. Orientation: The patient is alert. Oriented to person,not to place or date Cranial nerves: There is good facial symmetry.The speech is fluent and clear. No aphasia or dysarthria. Fund of knowledge is reduced. Recent and remote memory are impaired. Attention and concentration are reduced.  Able to name objects and repeat phrases.  Hearing is intact to conversational tone.  Sensation: Sensation is intact to light touch throughout Motor: Strength is at least antigravity x4. DTR's 2/4 in UE/LE     Movement examination: Tone: There is normal tone in the UE/LE Abnormal movements: He has mild bilateral resting hand tremors.  No myoclonus.  No asterixis.   Coordination:  There is no decremation with RAM's. Normal finger to nose  Gait and Station: The patient has no difficulty arising out of a deep-seated chair without the use of the hands. The patient's stride length is short, uses a walker.  Gait is cautious and narrow.    Thank you for allowing [place] the opportunity to participate in the care of this nice patient. Please do not hesitate to contact us for any questions or concerns.   Total time spent on today's visit was 42 minutes dedicated to this patient today, preparing to see patient, examining the patient, ordering tests and/or medications and counseling the patient, documenting clinical information in the EHR or other health record, independently interpreting results and communicating results to the patient/family, discussing treatment and goals, answering patient's questions and coordinating care.  Cc:  Donita Brooks, MD  Marlowe Kays 05/14/2023 11:45 AM

## 2023-06-03 ENCOUNTER — Other Ambulatory Visit: Payer: Self-pay | Admitting: Cardiovascular Disease

## 2023-06-21 ENCOUNTER — Telehealth: Payer: Self-pay | Admitting: Cardiovascular Disease

## 2023-06-21 NOTE — Telephone Encounter (Signed)
 Caller Concha Deed) wanted to give an update:  Patient [name] is 60 today  BP is 130/62. Patient is still taking the amiodarone  and amlodipine .  Caller stated patient is having increased swelling on his ankle and feet.

## 2023-06-21 NOTE — Telephone Encounter (Signed)
 Calling to say the patient has been have a low pulse, its been in the low 50s. Calling in to see if any of his medication needs to be change. Please advise

## 2023-06-21 NOTE — Telephone Encounter (Signed)
 Patient identification verified by 2 forms. Sims Duck, RN     Called and spoke to patient. His wife Mariah Shines answered the phone.   Mariah Shines states:  - patient having low HR on Monday during in home hospice visit. Wife stated it was in the low 50s but unable to provide exact reading during time of call.  - increased fatigue, and sleepiness  - patient compliant with medications   Patient denies:  - No other abnormal symptoms reported during call              Interventions/Plan: - Encounter forwarded to primary cardiologist for review/recommendations.    Mariah Shines agrees with plan, no questions at this time

## 2023-06-21 NOTE — Telephone Encounter (Signed)
 Spoke with [name] and she called to give update on patients visit today.BP/HR. He also have swelling in ankles.   Tried to call wife LVM to return call to office.   Will forward to provider

## 2023-06-23 NOTE — Telephone Encounter (Signed)
 Reviewing notes and medications. I would be ok with him stopping amlodipine  if the swelling is bad. He should also increase furosemide  to 40 mg daily and I suspect that will help. The vital signs look fine and I'm not concerned about HR in the 50's. Let me know if any other concerns. thanks

## 2023-06-25 NOTE — Telephone Encounter (Signed)
 Called and spoke with Concha Deed w/[facility] who states that she will have him stop the Amlodipine . She says that right now pt is only taking the Furosemide  20mg  as needed, so she will bump this to daily if needed and wait to increase it to 40mg . Will call back if other concerns.

## 2023-06-25 NOTE — Telephone Encounter (Signed)
 Spoke with Pts wife. Concha Deed will be out there at 2 pm today. Told Mariah Shines what Dr Arlester Ladd had recommended and that Concha Deed had spoken to Dr Viann Graces nurse this morning. Mariah Shines stated understanding.

## 2023-06-25 NOTE — Telephone Encounter (Signed)
 Patient's wife is requesting to speak with RN regarding this matter.

## 2023-06-28 MED ORDER — AMLODIPINE BESYLATE 5 MG PO TABS: 5.0000 mg | ORAL_TABLET | Freq: Every day | ORAL | Status: DC | PRN

## 2023-06-28 NOTE — Telephone Encounter (Signed)
 Return call from [place] transferred to triage.  Discussed recommendation from Dr. Arlester Ladd: OK to give amlodipine  for SBP > 170 mmHg. Otherwise ok to stay off of it. And frequent BP checks really aren't necessary. Thx and let me know if any other questions or concerns.     Medication list updated. [name] verbalized understanding and expressed appreciation for call.

## 2023-06-28 NOTE — Telephone Encounter (Signed)
 Pt c/o BP issue: STAT if pt c/o blurred vision, one-sided weakness or slurred speech.   1. What is your BP concern?  Concha Deed, nurse with [facility] following up. She says she saw patient today and BP is high. She would like to establish parameters + does patient need to go back on BP meds PRN.  2. Have you taken any BP medication today? No, Amlodipine  was stopped.  3. What are your last 5 BP readings? 158 systolic the other day, per Concha Deed 142/60 today   4. Are you having any other symptoms (ex. Dizziness, headache, blurred vision, passed out)?  Patient is asymptomatic and was sleeping when Concha Deed went to see patient today.

## 2023-06-28 NOTE — Telephone Encounter (Signed)
 Left message for [name] to call back.

## 2023-06-28 NOTE — Telephone Encounter (Signed)
 OK to give amlodipine  for SBP > 170 mmHg. Otherwise ok to stay off of it. And frequent BP checks really aren't necessary. Thx and let me know if any other questions or concerns.

## 2023-06-28 NOTE — Telephone Encounter (Signed)
 Concha Deed from [facility] reports wife told her patient's systolic BP was 158 yesterday while he was sleeping. Today at her home visit, Concha Deed got a reading of 142/60. No symptoms reported.   Amlodipine  was discontinued earlier this week due to swelling in ankles. Patient has been off of amlodipine  for 3 days now and no noticeable difference in swelling noticed per Concha Deed.  Would like to know if they can take amlodipine  PRN for elevated BP and what parameters would indicate when they should give the amlodipine .   Will forward to Dr. Arlester Ladd to review and advise.

## 2023-06-28 NOTE — Addendum Note (Signed)
 Addended by: Luwana Salvo on: 06/28/2023 01:23 PM   Modules accepted: Orders

## 2023-07-08 ENCOUNTER — Other Ambulatory Visit: Payer: Self-pay

## 2023-07-08 ENCOUNTER — Telehealth: Payer: Self-pay

## 2023-07-08 DIAGNOSIS — E119 Type 2 diabetes mellitus without complications: Secondary | ICD-10-CM

## 2023-07-08 MED ORDER — EMPAGLIFLOZIN 10 MG PO TABS: 10.0000 mg | ORAL_TABLET | Freq: Every day | ORAL | 0 refills | Status: DC

## 2023-07-08 NOTE — Telephone Encounter (Signed)
 Prescription Request  07/08/2023  LOV: 01/24/23  What is the name of the medication or equipment? JARDIANCE  10 MG TABS tablet [[id number]]   Have you contacted your pharmacy to request a refill? Yes   Which pharmacy would you like this sent to?  [id number] - [place], [place] - [address] AT [address] OF [facility] [address] [place] [address] Phone: [phone] Fax: [phone]    Patient notified that their request is being sent to the clinical staff for review and that they should receive a response within 2 business days.   Please advise at [facility] [phone]

## 2023-07-15 ENCOUNTER — Telehealth: Payer: Self-pay | Admitting: Cardiovascular Disease

## 2023-07-15 NOTE — Telephone Encounter (Signed)
 Julie- [facility] [phone]   Hospice nurse called to report for the pts wife peace of mind to let Dr Arlester Ladd know that the pt has been having more labored breathing at night when trying to sleep but they have received and order for PRN O2 over the weekend and this has improved and his HR has come down since he has been using the O2.. I thanked her for the update and she will let us  know as the pt progresses/ changes.

## 2023-07-15 NOTE — Telephone Encounter (Signed)
 New Message:       [name] is calling to report that patient is having elevated heart rate.    STAT if HR is under 50 or over 120 (normal HR is 60-100 beats per minute)  What is your heart rate? 110 to 120  Do you have a log of your heart rate readings (document readings)?   Do you have any other symptoms? Patient oxygen saturations are dropping in the 80 percent- She said she have order oxygen PRN.

## 2023-07-15 NOTE — Telephone Encounter (Signed)
 Left message for [name] to return the call.

## 2023-07-15 NOTE — Telephone Encounter (Signed)
 Concha Deed is returning RN's call. Please advise.

## 2023-07-16 NOTE — Telephone Encounter (Signed)
 New Message:      Concha Deed, Nurse from [facility] needs to talk to a nurse. She needs to give another update.k

## 2023-07-16 NOTE — Telephone Encounter (Signed)
 Concha Deed [facility] nurse) with [facility] called for VS update on pt:  HR 126, respirations 20, BP 128/84, O2 96% RA  Asymptomatic with increased HR, liquid morphine  rx sent in for comfort if needed.   Will forward to Dr. Arlester Ladd and covering nurse as Arlie Lain.

## 2023-07-16 NOTE — Telephone Encounter (Signed)
Thx for letting me know

## 2023-07-26 ENCOUNTER — Other Ambulatory Visit: Payer: Self-pay

## 2023-07-26 ENCOUNTER — Telehealth: Payer: Self-pay | Admitting: Family Medicine

## 2023-07-26 DIAGNOSIS — R0602 Shortness of breath: Secondary | ICD-10-CM

## 2023-07-26 MED ORDER — FLUTICASONE FUROATE-VILANTEROL 200-25 MCG/ACT IN AEPB: 1.0000 | INHALATION_SPRAY | Freq: Every day | RESPIRATORY_TRACT | 3 refills | Status: DC

## 2023-07-26 NOTE — Telephone Encounter (Signed)
 Prescription Request  07/26/2023  LOV: 01/24/2023 Pharmacy is asking for an alternative requested: non formulary  What is the name of the medication or equipment? fluticasone  furoate-vilanterol (BREO ELLIPTA ) 200-25 MCG/ACT AEPB   Have you contacted your pharmacy to request a refill? Yes   Which pharmacy would you like this sent to?  [id number] - [place], [place] - [address] AT [address] OF [facility] [address] [place] [address] Phone: [phone] Fax: [phone]    Patient notified that their request is being sent to the clinical staff for review and that they should receive a response within 2 business days.   Please advise at [facility] [phone]

## 2023-07-30 ENCOUNTER — Other Ambulatory Visit: Payer: Self-pay | Admitting: Family Medicine

## 2023-07-30 MED ORDER — FLUTICASONE-SALMETEROL 250-50 MCG/ACT IN AEPB: 1.0000 | INHALATION_SPRAY | Freq: Two times a day (BID) | RESPIRATORY_TRACT | 11 refills | Status: DC

## 2023-09-03 ENCOUNTER — Other Ambulatory Visit: Payer: Self-pay | Admitting: Physician Assistant

## 2023-09-03 ENCOUNTER — Other Ambulatory Visit: Payer: Self-pay | Admitting: Family Medicine

## 2023-09-03 DIAGNOSIS — F0390 Unspecified dementia without behavioral disturbance: Secondary | ICD-10-CM

## 2023-09-03 NOTE — Telephone Encounter (Signed)
 Prescription Request  09/03/2023  LOV: 01/24/2023  What is the name of the medication or equipment?   atorvastatin  (LIPITOR) 20 MG tablet  **90 day script requested**  Have you contacted your pharmacy to request a refill? Yes   Which pharmacy would you like this sent to?  [id number] - [place], [place] - [address] AT [address] OF [facility] [address] [place] [address] Phone: [phone] Fax: [phone]    Patient notified that their request is being sent to the clinical staff for review and that they should receive a response within 2 business days.   Please advise pharmacist.

## 2023-09-04 MED ORDER — ATORVASTATIN CALCIUM 20 MG PO TABS: 20.0000 mg | ORAL_TABLET | Freq: Every day | ORAL | 0 refills | Status: AC

## 2023-09-04 NOTE — Telephone Encounter (Signed)
 Requested Prescriptions  Pending Prescriptions Disp Refills   atorvastatin  (LIPITOR) 20 MG tablet 90 tablet 0    Sig: Take 1 tablet (20 mg total) by mouth daily.     Cardiovascular:  Antilipid - Statins Failed - 09/04/2023 11:14 AM      Failed - Lipid Panel in normal range within the last 12 months    Cholesterol, Total  Date Value Ref Range Status  04/26/2019 129 100 - 199 mg/dL Final   Cholesterol  Date Value Ref Range Status  04/28/2022 110 0 - 200 mg/dL Final   LDL Cholesterol (Calc)  Date Value Ref Range Status  12/15/2021 52 mg/dL (calc) Final    Comment:    Reference range: <100 . Desirable range <100 mg/dL for primary prevention;   <70 mg/dL for patients with CHD or diabetic patients  with > or = 2 CHD risk factors. SABRA LDL-C is now calculated using the Martin-Hopkins  calculation, which is a validated novel method providing  better accuracy than the Friedewald equation in the  estimation of LDL-C.  Gladis APPLETHWAITE et al. SANDREA. 7986;689(80): 2061-2068  (http://education.[employer].com/faq/FAQ164)    LDL Cholesterol  Date Value Ref Range Status  04/28/2022 52 0 - 99 mg/dL Final    Comment:           Total Cholesterol/HDL:CHD Risk Coronary Heart Disease Risk Table                     Men   Women  1/2 Average Risk   3.4   3.3  Average Risk       5.0   4.4  2 X Average Risk   9.6   7.1  3 X Average Risk  23.4   11.0        Use the calculated Patient Ratio above and the CHD Risk Table to determine the patient's CHD Risk.        ATP III CLASSIFICATION (LDL):  <100     mg/dL   Optimal  899-870  mg/dL   Near or Above                    Optimal  130-159  mg/dL   Borderline  839-810  mg/dL   High  >809     mg/dL   Very High Performed at [facility], [address] [place], [place] [address]    HDL  Date Value Ref Range Status  04/28/2022 50 >40 mg/dL Final  96/78/7978 58 >60 mg/dL Final   Triglycerides  Date Value Ref Range Status  04/28/2022 39  <150 mg/dL Final         Passed - Patient is not pregnant      Passed - Valid encounter within last 12 months    Recent Outpatient Visits           7 months ago Acute cough   [place] [facility] [name], MD   9 months ago Skin tear of right elbow without complication, initial encounter   [facility] [name], MD   10 months ago Orthostatic hypotension   [facility] Pickard, Butler DASEN, MD   1 year ago Urinary frequency   [facility] Duanne, Butler DASEN, MD   1 year ago Pleurisy   [facility] Pickard, Butler DASEN, MD       Future  Appointments             In 1 month Wonda Sharper, MD [facility] at [facility] A Dept of [facility] [place]. [facility] [employer], [facility]

## 2023-10-04 ENCOUNTER — Telehealth: Payer: Self-pay | Admitting: Cardiovascular Disease

## 2023-10-04 NOTE — Telephone Encounter (Signed)
 Pt's wife is calling on behalf of pt; pt is in in-home hospice and pt's wife would like to know if pt could be seen by Dr. Wonda at the same time that pt is already scheduled, 9/3 at 10 am, virtually with hospice nurse on the call.

## 2023-10-04 NOTE — Telephone Encounter (Signed)
 Can appt with Dr Wonda on 9/3 be virtual?

## 2023-10-05 ENCOUNTER — Other Ambulatory Visit: Payer: Self-pay | Admitting: Family Medicine

## 2023-10-05 DIAGNOSIS — E119 Type 2 diabetes mellitus without complications: Secondary | ICD-10-CM

## 2023-10-07 NOTE — Telephone Encounter (Signed)
 OFFICE VISIT NEEDED FOR ADDITIONAL REFILLS, courtesy refill given.  Requested Prescriptions  Pending Prescriptions Disp Refills   empagliflozin  (JARDIANCE ) 10 MG TABS tablet [Pharmacy Med Name: JARDIANCE  10 MG TABLET] 30 tablet 0    Sig: TAKE 1 TABLET BY MOUTH DAILY BEFORE BREAKFAST.     Endocrinology:  Diabetes - SGLT2 Inhibitors Failed - 10/07/2023 10:04 AM      Failed - Cr in normal range and within 360 days    Creat  Date Value Ref Range Status  02/04/2023 3.14 (H) 0.70 - 1.22 mg/dL Final   Creatinine, Ser  Date Value Ref Range Status  05/03/2023 2.29 (H) 0.76 - 1.27 mg/dL Final   Creatinine, Urine  Date Value Ref Range Status  11/18/2020 106 20 - 320 mg/dL Final         Failed - HBA1C is between 0 and 7.9 and within 180 days    Hgb A1c MFr Bld  Date Value Ref Range Status  04/28/2022 6.2 (H) 4.8 - 5.6 % Final    Comment:    (NOTE)         Prediabetes: 5.7 - 6.4         Diabetes: >6.4         Glycemic control for adults with diabetes: <7.0          Failed - eGFR in normal range and within 360 days    GFR, Est African American  Date Value Ref Range Status  01/19/2020 54 (L) > OR = 60 mL/min/1.21m2 Final   GFR, Est Non African American  Date Value Ref Range Status  01/19/2020 47 (L) > OR = 60 mL/min/1.32m2 Final   GFR, Estimated  Date Value Ref Range Status  03/11/2023 23 (L) >60 mL/min Final    Comment:    (NOTE) Calculated using the CKD-EPI Creatinine Equation (2021)    eGFR  Date Value Ref Range Status  05/03/2023 27 (L) >59 mL/min/1.73 Final         Failed - Valid encounter within last 6 months    Recent Outpatient Visits           8 months ago Acute cough   [place] [facility] [name], MD   10 months ago Skin tear of right elbow without complication, initial encounter   [place] [facility] [name], MD   11 months ago Orthostatic hypotension   [place] [facility]  Pickard, Butler DASEN, MD   1 year ago Urinary frequency   [place] [facility] [name], MD   1 year ago Pleurisy   [facility] Pickard, Butler DASEN, MD       Future Appointments             In 2 days Wonda Sharper, MD [facility] at [facility] Dept of [facility] [place]. [facility] [employer], H&V

## 2023-10-08 NOTE — Telephone Encounter (Signed)
 Attempted to call pt, unable to reach. LVM explaining that we have been able to move his 9/3 appt with Dr. Wonda to a virtual appt and he will need to be able to access his MyChart (last date accessed was today 9/2). Advised pt that we like to be able to have the pt's take a BP at home and get a weight at home if possible and to let us  know for the visit. Advised pt to call our office back with any questions or concerns.

## 2023-10-09 ENCOUNTER — Encounter: Payer: Self-pay | Admitting: Cardiovascular Disease

## 2023-10-09 ENCOUNTER — Ambulatory Visit: Attending: Cardiovascular Disease | Admitting: Cardiovascular Disease

## 2023-10-09 ENCOUNTER — Telehealth: Payer: Self-pay | Admitting: *Deleted

## 2023-10-09 VITALS — BP 102/82 | HR 102 | Ht 66.0 in

## 2023-10-09 DIAGNOSIS — I5032 Chronic diastolic (congestive) heart failure: Secondary | ICD-10-CM | POA: Diagnosis not present

## 2023-10-09 DIAGNOSIS — I4819 Other persistent atrial fibrillation: Secondary | ICD-10-CM | POA: Diagnosis not present

## 2023-10-09 MED ORDER — FUROSEMIDE 40 MG PO TABS: 40.0000 mg | ORAL_TABLET | Freq: Every day | ORAL | 3 refills | Status: AC

## 2023-10-09 NOTE — Progress Notes (Signed)
 Virtual Visit via Video Note   Because of [name]'s co-morbid illnesses, he is at least at moderate risk for complications without adequate follow up.  This format is felt to be most appropriate for this patient at this time.  All issues noted in this document were discussed and addressed.  A limited physical exam was performed with this format.  Please refer to the patient's chart for his consent to telehealth for [facility].  Video Connection Lost Video connection was lost at > 50% of the duration of this visit, at which time the remainder of the visit was completed via audio only.      Date:  10/09/2023   ID:  [name], DOB 1933-11-10, MRN [id number] The patient was identified using 2 identifiers.  Patient Location: Home Provider Location: Office/Clinic   PCP:  [name], MD   [facility] Providers Cardiologist:  Ozell Fell, MD     Evaluation Performed:  Follow-Up Visit  Chief Complaint:  shortness of breath  History of Present Illness:    [name] is a 89 y.o. male with complex cardiac history who is now in hospice care.  He presents today for evaluation via telehealth due to his inability to get out of the home.  We had to convert the visit to a telephone visit because we could not get the audio to work on the Research scientist (physical sciences).  The patient has a history of coronary artery disease dating back to 2008 when he underwent PCI of the LAD.  Other problems include heart failure with preserved ejection fraction (HFpEF), hypertension, hyperlipidemia, persistent atrial fibrillation, and Alzheimer's dementia.  The patient is evaluated over the telephone because we were unable to get the audio to work on the video feed. I discussed his clinical status with his Hospice nurse who is present with the patient.  I also spoke with his wife.  They report that the patient has had worsened respiratory status over the past week.  He  has increased edema and also increased confusion from baseline.  He has not complained of chest pain.  He is currently requiring 2 people to help him transfer or move.  Past Medical History:  Diagnosis Date   Acute blood loss anemia    Acute upper respiratory infections of unspecified site 11/04/2012   AKI (acute kidney injury) (HCC)    Alzheimer disease (HCC)    Benign prostatic hyperplasia    CAD (coronary artery disease)    s/p cypher DES to pLAD 6/08; normal LVF;  ETT-Myoview 2009: no ischemia    Clavicle fracture 08/10/2019   Closed displaced fracture of phalanx of left thumb, sequela 08/10/2019   Coronary atherosclerosis of native coronary artery 03/08/2008   Dizziness 03/08/2020   Eosinophilia    Essential hypertension    Fracture of multiple ribs with pain 08/10/2019   Hematoma 11/04/2012   History of multiple falls    History of SAH (subarachnoid hemorrhage) 07/14/2019   Hyperlipidemia type IIB / III 03/08/2008   Itching of ear 07/14/2021   Major neurocognitive disorder due to possible Alzheimer's disease, without behavioral disturbance (HCC) 02/29/2020   MI (myocardial infarction) (HCC)    Moderate persistent asthma    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   Imaging revealed [facility]   Multiple trauma    Nocturnal leg cramps 11/23/2012   Orthostatic hypotension 03/08/2008   Otalgia of left ear 05/11/2021   Otorrhagia of left ear  03/30/2021   Pain in joint, shoulder region 11/23/2012   Stage 3b chronic kidney disease (HCC)    Thrombocytopenia (HCC)    Trigeminal neuralgia    Type 2 diabetes mellitus (HCC)    Past Surgical History:  Procedure Laterality Date   APPENDECTOMY     CARDIAC CATHETERIZATION  07/30/2006   CORONARY ANGIOPLASTY WITH STENT PLACEMENT   CARDIAC CATHETERIZATION     CATARACT EXTRACTION Right 03/2022   CATARACT EXTRACTION Left 04/2022   EXPLORATORY LAPAROTOMY     age 36   EXPLORATORY LAPAROTOMY     IR  THORACENTESIS ASP PLEURAL SPACE W/IMG GUIDE  10/05/2019     Current Meds  Medication Sig   acetaminophen  (TYLENOL ) 325 MG tablet Take 650 mg by mouth as needed.   albuterol  (PROAIR  HFA) 108 (90 Base) MCG/ACT inhaler INHALE 2 PUFFS INTO THE LUNGS EVERY 6 HOURS AS NEEDED FOR WHEEZING OR SHORTNESS OF BREATH   amiodarone  (PACERONE ) 200 MG tablet Take 0.5 tablets (100 mg total) by mouth daily.   empagliflozin  (JARDIANCE ) 10 MG TABS tablet TAKE 1 TABLET BY MOUTH DAILY BEFORE BREAKFAST.   escitalopram  (LEXAPRO ) 10 MG tablet TAKE 1 TABLET BY MOUTH EVERY DAY   fluticasone  furoate-vilanterol (BREO ELLIPTA ) 200-25 MCG/ACT AEPB Inhale 1 puff into the lungs daily.   LORazepam (ATIVAN) 0.5 MG tablet Take 0.5 mg by mouth every 4 (four) hours as needed.   Morphine  Sulfate (MORPHINE  CONCENTRATE) 10 mg / 0.5 ml concentrated solution as needed.   potassium chloride  (KLOR-CON  M) 10 MEQ tablet Only take when taking Lasix  (Patient taking differently: as needed. Only take when taking Lasix )   prochlorperazine  (COMPAZINE ) 10 MG tablet Take 10 mg by mouth every 4 (four) hours as needed.   QUEtiapine (SEROQUEL) 25 MG tablet Take 12.5 mg by mouth every 6 (six) hours as needed.   senna (SENOKOT) 8.6 MG tablet as needed.   tamsulosin  (FLOMAX ) 0.4 MG CAPS capsule SMARTSIG:1 Capsule(s) By Mouth Every Evening   [DISCONTINUED] furosemide  (LASIX ) 40 MG tablet Take 40 mg by mouth.     Allergies:   Gabapentin    Social History   Tobacco Use   Smoking status: Never   Smokeless tobacco: Never  Vaping Use   Vaping status: Never Used  Substance Use Topics   Alcohol use: Not Currently   Drug use: Never     Family Hx: The patient's family history includes Aortic aneurysm in his brother and brother; Arthritis in his daughter; Colon cancer in his brother and brother; Coronary artery disease in his brother; Coronary artery disease (age of onset: 13) in his father; Heart attack in his father; Heart disease in his brother and  father; Memory loss in his mother; Stomach cancer in his mother. There is no history of Esophageal cancer or Rectal cancer.  ROS:   Please see the history of present illness.    Positive for fatigue, shortness of breath, edema. All other systems reviewed and are negative.   Prior CV studies:   The following studies were reviewed today:  Cardiac Studies & Procedures   ______________________________________________________________________________________________     ECHOCARDIOGRAM  ECHOCARDIOGRAM COMPLETE 03/06/2023  Narrative ECHOCARDIOGRAM REPORT    Patient Name:   THERMAN HUGHLETT Date of Exam: 03/06/2023 Medical Rec #:  [id number]          Height:       67.0 in Accession #:    [id number]         Weight:       180.0 lb  Date of Birth:  08/26/1933          BSA:          1.934 m Patient Age:    89 years           BP:           141/64 mmHg Patient Gender: M                  HR:           62 bpm. Exam Location:  Inpatient  Procedure: 2D Echo, Color Doppler, Cardiac Doppler and Intracardiac Opacification Agent  Indications:    CHF  History:        Patient has prior history of Echocardiogram examinations, most recent 01/10/2022. CAD and Previous Myocardial Infarction, Arrythmias:Atrial Fibrillation; Risk Factors:Hypertension and Diabetes.  Sonographer:    Lanell Maduro Referring Phys: [id number] RAVI PAHWANI  IMPRESSIONS   1. Left ventricular ejection fraction, by estimation, is 45 to 50%. The left ventricle has mildly decreased function. The left ventricle has no regional wall motion abnormalities. Left ventricular diastolic parameters are consistent with Grade III diastolic dysfunction (restrictive). 2. Right ventricular systolic function is normal. The right ventricular size is normal. 3. Left atrial size was mildly dilated. 4. The mitral valve is normal in structure. Trivial mitral valve regurgitation. No evidence of mitral stenosis. Moderate mitral annular  calcification. 5. The aortic valve is normal in structure. There is mild calcification of the aortic valve. Aortic valve regurgitation is trivial. Aortic valve sclerosis/calcification is present, without any evidence of aortic stenosis.  FINDINGS Left Ventricle: Left ventricular ejection fraction, by estimation, is 45 to 50%. The left ventricle has mildly decreased function. The left ventricle has no regional wall motion abnormalities. Definity  contrast agent was given IV to delineate the left ventricular endocardial borders. The left ventricular internal cavity size was normal in size. There is no left ventricular hypertrophy. Left ventricular diastolic parameters are consistent with Grade III diastolic dysfunction (restrictive).  Right Ventricle: The right ventricular size is normal. No increase in right ventricular wall thickness. Right ventricular systolic function is normal.  Left Atrium: Left atrial size was mildly dilated.  Right Atrium: Right atrial size was normal in size.  Pericardium: There is no evidence of pericardial effusion.  Mitral Valve: The mitral valve is normal in structure. Moderate mitral annular calcification. Trivial mitral valve regurgitation. No evidence of mitral valve stenosis.  Tricuspid Valve: The tricuspid valve is normal in structure. Tricuspid valve regurgitation is trivial. No evidence of tricuspid stenosis.  Aortic Valve: The aortic valve is normal in structure. There is mild calcification of the aortic valve. Aortic valve regurgitation is trivial. Aortic valve sclerosis/calcification is present, without any evidence of aortic stenosis. Aortic valve peak gradient measures 10.0 mmHg.  Pulmonic Valve: The pulmonic valve was normal in structure. Pulmonic valve regurgitation is not visualized. No evidence of pulmonic stenosis.  Aorta: The aortic root is normal in size and structure.  Venous: The inferior vena cava was not well visualized.  IAS/Shunts: No  atrial level shunt detected by color flow Doppler.   LEFT VENTRICLE PLAX 2D LVIDd:         4.40 cm      Diastology LVIDs:         3.20 cm      LV e' medial:    4.46 cm/s LV PW:         1.40 cm      LV E/e' medial:  22.9 LV  IVS:        2.00 cm      LV e' lateral:   5.66 cm/s LVOT diam:     2.20 cm      LV E/e' lateral: 18.0 LV SV:         91 LV SV Index:   47 LVOT Area:     3.80 cm  LV Volumes (MOD) LV vol d, MOD A2C: 151.0 ml LV vol d, MOD A4C: 135.0 ml LV vol s, MOD A2C: 79.8 ml LV vol s, MOD A4C: 72.9 ml LV SV MOD A2C:     71.2 ml LV SV MOD A4C:     135.0 ml LV SV MOD BP:      67.7 ml  RIGHT VENTRICLE RV Basal diam:  3.00 cm RV S prime:     13.60 cm/s TAPSE (M-mode): 1.9 cm  LEFT ATRIUM             Index        RIGHT ATRIUM           Index LA diam:        3.50 cm 1.81 cm/m   RA Area:     11.40 cm LA Vol (A2C):   53.7 ml 27.77 ml/m  RA Volume:   17.70 ml  9.15 ml/m LA Vol (A4C):   44.9 ml 23.22 ml/m LA Biplane Vol: 51.8 ml 26.79 ml/m AORTIC VALVE AV Area (Vmax): 2.65 cm AV Vmax:        158.00 cm/s AV Peak Grad:   10.0 mmHg LVOT Vmax:      110.00 cm/s LVOT Vmean:     77.300 cm/s LVOT VTI:       0.239 m  AORTA Ao Root diam: 3.10 cm Ao Asc diam:  3.60 cm  MITRAL VALVE MV Area (PHT): 3.65 cm     SHUNTS MV Decel Time: 208 msec     Systemic VTI:  0.24 m MV E velocity: 102.00 cm/s  Systemic Diam: 2.20 cm MV A velocity: 57.70 cm/s MV E/A ratio:  1.77  Toribio Fuel MD Electronically signed by Toribio Fuel MD Signature Date/Time: 03/06/2023/3:04:15 PM    Final    MONITORS  LONG TERM MONITOR (3-14 DAYS) 05/21/2022  Narrative Patch Wear Time:  7 days and 1 hours (2024-04-02T14:40:12-398 to 2024-04-09T16:26:53-0400)  Patient had a min HR of 54 bpm, max HR of 181 bpm, and avg HR of 73 bpm. Predominant underlying rhythm was Sinus Rhythm. Intermittent Bundle Branch Block was present. 4 Supraventricular Tachycardia runs occurred, the run with the  fastest interval lasting 7 beats with a max rate of 146 bpm, the longest lasting 7 beats with an avg rate of 105 bpm. Atrial Fibrillation occurred (6% burden), ranging from 85-181 bpm (avg of 124 bpm), the longest lasting 4 hours 39 mins with an avg rate of 131 bpm. Isolated SVEs were rare (<1.0%), SVE Couplets were rare (<1.0%), and SVE Triplets were rare (<1.0%). Isolated VEs were rare (<1.0%), VE Couplets were rare (<1.0%), and no VE Triplets were present.  SUMMARY: The basic rhythm is normal sinus with an average HR of 73 bpm There are a few episodes of atrial fibrillation and atrial fibrillation with RVR with an overall burden of 6% No high-grade heart block or pathologic pauses There are rare PVC's without sustained ventricular arrhythmia       ______________________________________________________________________________________________       Labs/Other Tests and Data Reviewed:    EKG:  No ECG reviewed.  Recent  Labs: 01/07/2023: TSH 2.250 03/06/2023: ALT 29; B Natriuretic Peptide 857.3 03/08/2023: Hemoglobin 9.7; Platelets 279 05/03/2023: BUN 36; Creatinine, Ser 2.29; Magnesium 2.1; Potassium 4.3; Sodium 138   Recent Lipid Panel Lab Results  Component Value Date/Time   CHOL 110 04/28/2022 01:22 AM   CHOL 129 04/26/2019 07:30 AM   TRIG 39 04/28/2022 01:22 AM   HDL 50 04/28/2022 01:22 AM   HDL 58 04/26/2019 07:30 AM   CHOLHDL 2.2 04/28/2022 01:22 AM   LDLCALC 52 04/28/2022 01:22 AM   LDLCALC 52 12/15/2021 08:45 AM    Wt Readings from Last 3 Encounters:  05/14/23 175 lb (79.4 kg)  05/03/23 177 lb (80.3 kg)  03/15/23 167 lb (75.8 kg)     Risk Assessment/Calculations:    CHA2DS2-VASc Score = 5   This indicates a 7.2% annual risk of stroke. The patient's score is based upon: CHF History: 1 HTN History: 1 Diabetes History: 0 Stroke History: 0 Vascular Disease History: 1 Age Score: 2 Gender Score: 0         Objective:    Vital Signs:  BP 102/82   Pulse (!)  102   Ht 5' 6 (1.676 m)   SpO2 98%   BMI 28.25 kg/m    VITAL SIGNS:  reviewed  ASSESSMENT & PLAN:    HFpEF: Sounds decompensated based on the description of his leg edema and worsening shortness of breath.  Recommend furosemide  80 mg Monday, Wednesday, and Friday and he can also take as needed.  Continue Jardiance . Persistent atrial fibrillation.  Continue amiodarone .  Off of anticoagulation due to advanced age/Alzheimer's dementia/fall risk Coronary artery disease: No angina reported Chronic kidney disease stage IV: Last creatinine 2.3 in March 2025. Goals of care: Dempsey discussion today with the patient's wife and also his hospice nurse.  They understand that he is in the dying process at we are working to aid in his comfort is much as possible.  Will adjust diuretics to try to combat some of the edema and shortness of breath that he is experiencing.  Otherwise not much to offer from a cardiac perspective at this time.            Time:   Today, I have spent 15 minutes with the patient with telehealth technology discussing the above problems.     Medication Adjustments/Labs and Tests Ordered: Current medicines are reviewed at length with the patient today.  Concerns regarding medicines are outlined above.   Tests Ordered: No orders of the defined types were placed in this encounter.   Medication Changes: Meds ordered this encounter  Medications   furosemide  (LASIX ) 40 MG tablet    Sig: Take 1 tablet (40 mg total) by mouth daily. Take additional 1 tablet (80mg  total) Monday, Wednesday and Friday    Dispense:  120 tablet    Refill:  3    Follow Up:  Virtual Visit  prn  Signed, Ozell Fell, MD  10/09/2023 1:13 PM    [facility]

## 2023-10-09 NOTE — Telephone Encounter (Signed)
  Patient Consent for Virtual Visit        [name] has provided verbal consent on 10/09/2023 for a virtual visit (video or telephone).   CONSENT FOR VIRTUAL VISIT FOR:  [name]  By participating in this virtual visit I agree to the following:  I hereby voluntarily request, consent and authorize [facility] and its employed or contracted physicians, physician assistants, nurse practitioners or other licensed health care professionals (the Practitioner), to provide me with telemedicine health care services (the "Services) as deemed necessary by the treating Practitioner. I acknowledge and consent to receive the Services by the Practitioner via telemedicine. I understand that the telemedicine visit will involve communicating with the Practitioner through live audiovisual communication technology and the disclosure of certain medical information by electronic transmission. I acknowledge that I have been given the opportunity to request an in-person assessment or other available alternative prior to the telemedicine visit and am voluntarily participating in the telemedicine visit.  I understand that I have the right to withhold or withdraw my consent to the use of telemedicine in the course of my care at any time, without affecting my right to future care or treatment, and that the Practitioner or I may terminate the telemedicine visit at any time. I understand that I have the right to inspect all information obtained and/or recorded in the course of the telemedicine visit and may receive copies of available information for a reasonable fee.  I understand that some of the potential risks of receiving the Services via telemedicine include:  Delay or interruption in medical evaluation due to technological equipment failure or disruption; Information transmitted may not be sufficient (e.g. poor resolution of images) to allow for appropriate medical decision making by the  Practitioner; and/or  In rare instances, security protocols could fail, causing a breach of personal health information.  Furthermore, I acknowledge that it is my responsibility to provide information about my medical history, conditions and care that is complete and accurate to the best of my ability. I acknowledge that Practitioner's advice, recommendations, and/or decision may be based on factors not within their control, such as incomplete or inaccurate data provided by me or distortions of diagnostic images or specimens that may result from electronic transmissions. I understand that the practice of medicine is not an exact science and that Practitioner makes no warranties or guarantees regarding treatment outcomes. I acknowledge that a copy of this consent can be made available to me via my patient portal [facility] MyChart), or I can request a printed copy by calling the office of [facility].    I understand that my insurance will be billed for this visit.   I have read or had this consent read to me. I understand the contents of this consent, which adequately explains the benefits and risks of the Services being provided via telemedicine.  I have been provided ample opportunity to ask questions regarding this consent and the Services and have had my questions answered to my satisfaction. I give my informed consent for the services to be provided through the use of telemedicine in my medical care

## 2023-10-09 NOTE — Patient Instructions (Addendum)
 Medication Instructions:  Your physician has recommended you make the following change in your medication:  Increase Lasix  to 80 mg Monday, Wednesday and Friday  Lab Work: None ordered.  You may go to any [facility] Location for your lab work:  [facility] - [address] ([facility]) - [address] [phone] [address]  [place] - [address]   [place]  - [address]   [place] - [address] - [address] [employer]  - [address] [place] - [address] ([facility]   If you have labs (blood work) drawn today and your tests are completely normal, you will receive your results only by: [employer] (if you have MyChart)  If you have any lab test that is abnormal or we need to change your treatment, we will call you or send a MyChart message to review the results.  Testing/Procedures: None ordered.  Follow-Up: At [facility], you and your health needs are our priority.  As part of our continuing mission to provide you with exceptional heart care, we have created designated Provider Care Teams.  These Care Teams include your primary Cardiologist (physician) and Advanced Practice Providers (APPs -  Physician Assistants and Nurse Practitioners) who all work together to provide you with the care you need, when you need it.  Your next appointment:   1 year(s)  The format for your next appointment:   In Person  Provider:   Ozell Fell, MD

## 2023-11-06 DEATH — deceased
